# Patient Record
Sex: Female | Born: 1940 | Race: White | Hispanic: No | Marital: Married | Smoking: Never smoker
Health system: Southern US, Community
[De-identification: ages and names within clinical notes are randomized; demographics above are authoritative.]

## PROBLEM LIST (undated history)

## (undated) DIAGNOSIS — Z931 Gastrostomy status: Secondary | ICD-10-CM

## (undated) DIAGNOSIS — J9610 Chronic respiratory failure, unspecified whether with hypoxia or hypercapnia: Secondary | ICD-10-CM

## (undated) DIAGNOSIS — K439 Ventral hernia without obstruction or gangrene: Secondary | ICD-10-CM

## (undated) DIAGNOSIS — M1990 Unspecified osteoarthritis, unspecified site: Secondary | ICD-10-CM

## (undated) DIAGNOSIS — C801 Malignant (primary) neoplasm, unspecified: Secondary | ICD-10-CM

## (undated) DIAGNOSIS — J3800 Paralysis of vocal cords and larynx, unspecified: Secondary | ICD-10-CM

## (undated) DIAGNOSIS — Z860100 Personal history of colon polyps, unspecified: Secondary | ICD-10-CM

## (undated) DIAGNOSIS — E119 Type 2 diabetes mellitus without complications: Secondary | ICD-10-CM

## (undated) DIAGNOSIS — H409 Unspecified glaucoma: Secondary | ICD-10-CM

## (undated) DIAGNOSIS — K219 Gastro-esophageal reflux disease without esophagitis: Secondary | ICD-10-CM

## (undated) DIAGNOSIS — G4730 Sleep apnea, unspecified: Secondary | ICD-10-CM

## (undated) DIAGNOSIS — G629 Polyneuropathy, unspecified: Secondary | ICD-10-CM

## (undated) DIAGNOSIS — C50919 Malignant neoplasm of unspecified site of unspecified female breast: Secondary | ICD-10-CM

## (undated) DIAGNOSIS — E7800 Pure hypercholesterolemia, unspecified: Secondary | ICD-10-CM

## (undated) DIAGNOSIS — Z923 Personal history of irradiation: Secondary | ICD-10-CM

## (undated) DIAGNOSIS — Z853 Personal history of malignant neoplasm of breast: Secondary | ICD-10-CM

## (undated) DIAGNOSIS — Z9221 Personal history of antineoplastic chemotherapy: Secondary | ICD-10-CM

## (undated) DIAGNOSIS — E1142 Type 2 diabetes mellitus with diabetic polyneuropathy: Secondary | ICD-10-CM

## (undated) HISTORY — DX: Malignant (primary) neoplasm, unspecified: C80.1

## (undated) HISTORY — PX: TRACHEOSTOMY: SUR1362

## (undated) HISTORY — DX: Personal history of malignant neoplasm of breast: Z85.3

## (undated) HISTORY — DX: Unspecified glaucoma: H40.9

## (undated) HISTORY — PX: COLONOSCOPY: SHX174

## (undated) HISTORY — DX: Sleep apnea, unspecified: G47.30

## (undated) HISTORY — DX: Pure hypercholesterolemia, unspecified: E78.00

## (undated) HISTORY — PX: CATARACT EXTRACTION, BILATERAL: SHX1313

## (undated) HISTORY — PX: ABDOMINAL HYSTERECTOMY: SHX81

## (undated) HISTORY — DX: Personal history of colon polyps, unspecified: Z86.0100

## (undated) HISTORY — DX: Polyneuropathy, unspecified: G62.9

## (undated) HISTORY — DX: Type 2 diabetes mellitus with diabetic polyneuropathy: E11.42

## (undated) HISTORY — DX: Personal history of colonic polyps: Z86.010

## (undated) HISTORY — PX: HYDROCELE EXCISION / REPAIR: SUR1145

---

## 2000-02-27 DIAGNOSIS — C801 Malignant (primary) neoplasm, unspecified: Secondary | ICD-10-CM

## 2000-02-27 DIAGNOSIS — C50911 Malignant neoplasm of unspecified site of right female breast: Secondary | ICD-10-CM

## 2000-02-27 DIAGNOSIS — C50919 Malignant neoplasm of unspecified site of unspecified female breast: Secondary | ICD-10-CM

## 2000-02-27 DIAGNOSIS — Z9221 Personal history of antineoplastic chemotherapy: Secondary | ICD-10-CM

## 2000-02-27 DIAGNOSIS — Z923 Personal history of irradiation: Secondary | ICD-10-CM

## 2000-02-27 HISTORY — DX: Malignant (primary) neoplasm, unspecified: C80.1

## 2000-02-27 HISTORY — DX: Personal history of irradiation: Z92.3

## 2000-02-27 HISTORY — DX: Malignant neoplasm of unspecified site of unspecified female breast: C50.919

## 2000-02-27 HISTORY — PX: BREAST SURGERY: SHX581

## 2000-02-27 HISTORY — DX: Personal history of antineoplastic chemotherapy: Z92.21

## 2000-02-27 HISTORY — DX: Malignant neoplasm of unspecified site of right female breast: C50.911

## 2000-02-27 HISTORY — PX: BREAST EXCISIONAL BIOPSY: SUR124

## 2000-02-27 HISTORY — PX: BREAST LUMPECTOMY: SHX2

## 2006-02-26 HISTORY — PX: BREAST BIOPSY: SHX20

## 2006-05-31 ENCOUNTER — Ambulatory Visit: Payer: Self-pay | Admitting: General Surgery

## 2006-07-05 ENCOUNTER — Other Ambulatory Visit: Payer: Self-pay

## 2006-07-05 ENCOUNTER — Ambulatory Visit: Payer: Self-pay | Admitting: General Surgery

## 2006-07-11 ENCOUNTER — Ambulatory Visit: Payer: Self-pay | Admitting: General Surgery

## 2007-05-05 ENCOUNTER — Ambulatory Visit: Payer: Self-pay | Admitting: Internal Medicine

## 2007-05-30 ENCOUNTER — Ambulatory Visit: Payer: Self-pay | Admitting: General Surgery

## 2008-05-31 ENCOUNTER — Ambulatory Visit: Payer: Self-pay | Admitting: General Surgery

## 2008-09-17 ENCOUNTER — Ambulatory Visit: Payer: Self-pay

## 2008-10-13 ENCOUNTER — Ambulatory Visit: Payer: Self-pay | Admitting: General Surgery

## 2008-11-16 ENCOUNTER — Ambulatory Visit: Payer: Self-pay | Admitting: Gastroenterology

## 2009-05-17 ENCOUNTER — Ambulatory Visit: Payer: Self-pay | Admitting: Otolaryngology

## 2009-05-28 IMAGING — CR RIGHT HIP - COMPLETE 2+ VIEW
1 series · 2 of 2 positions shown · non-contrast
Comparison: none

REASON FOR EXAM: Right hip pain
COMMENTS:

[Series 1: view not recorded · 0.17mm/px · 2 of 2 slices shown]
[im 1/2]
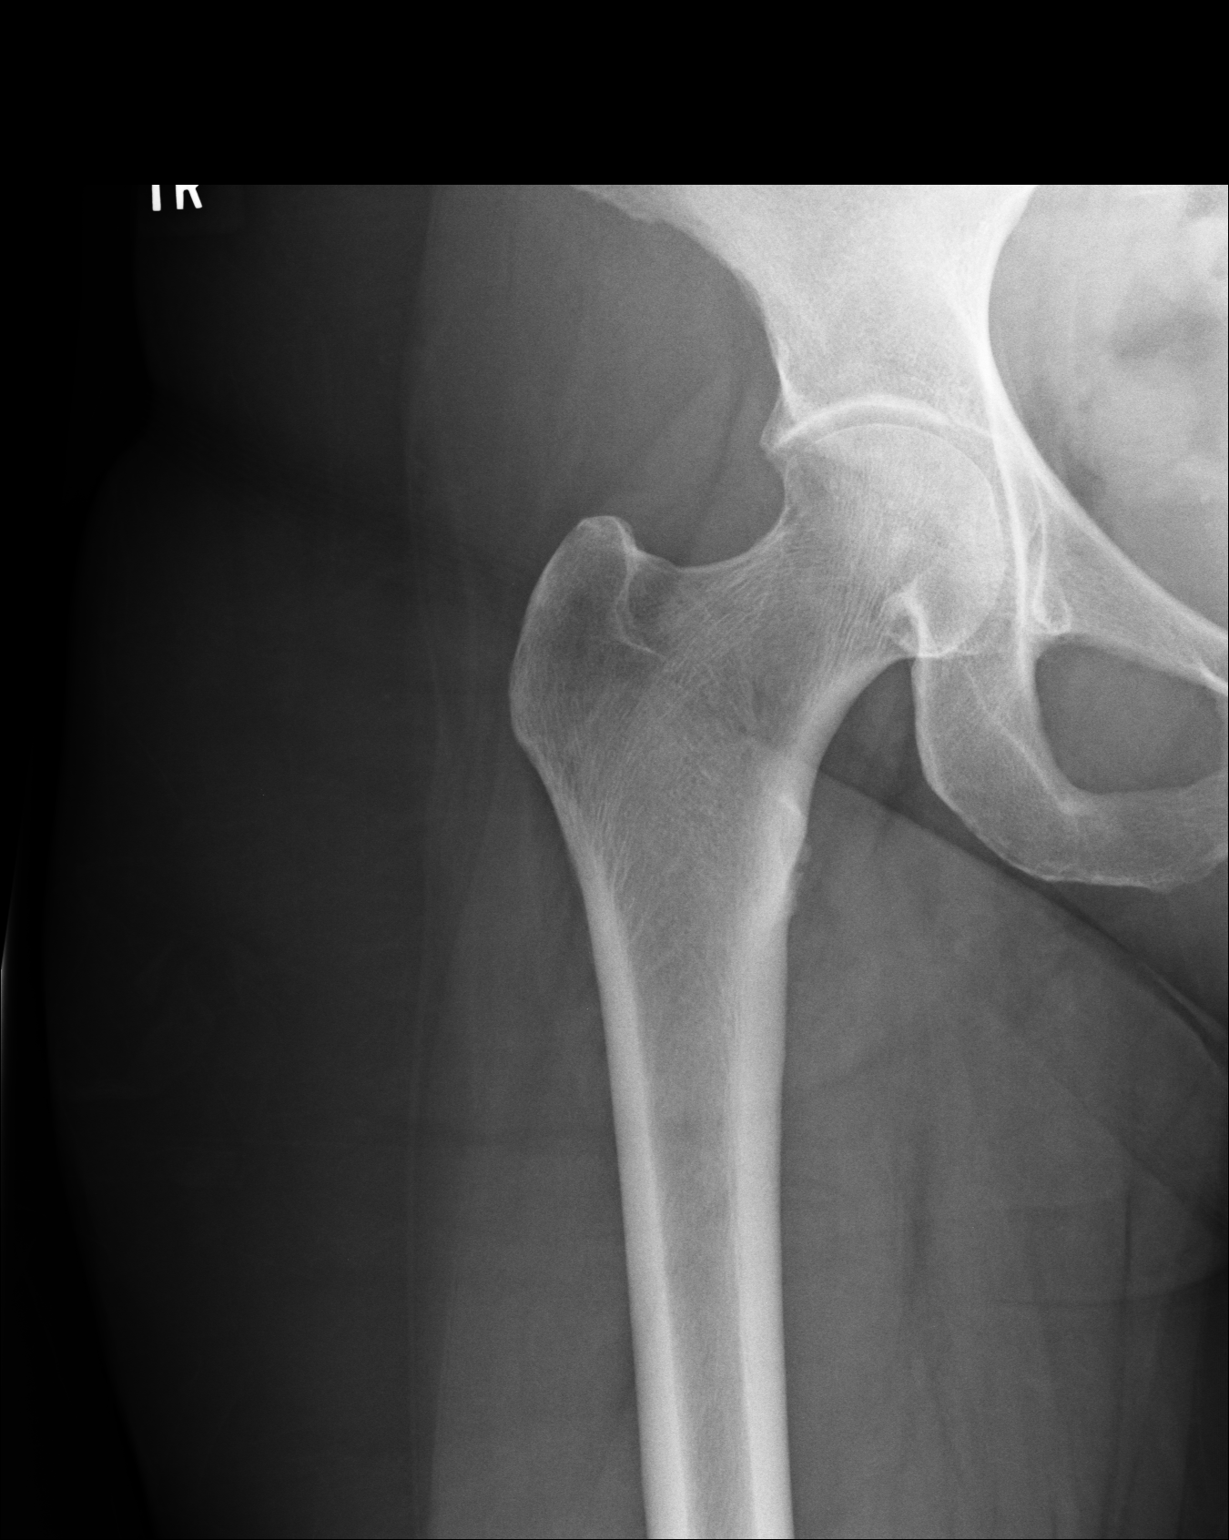
[im 2/2]
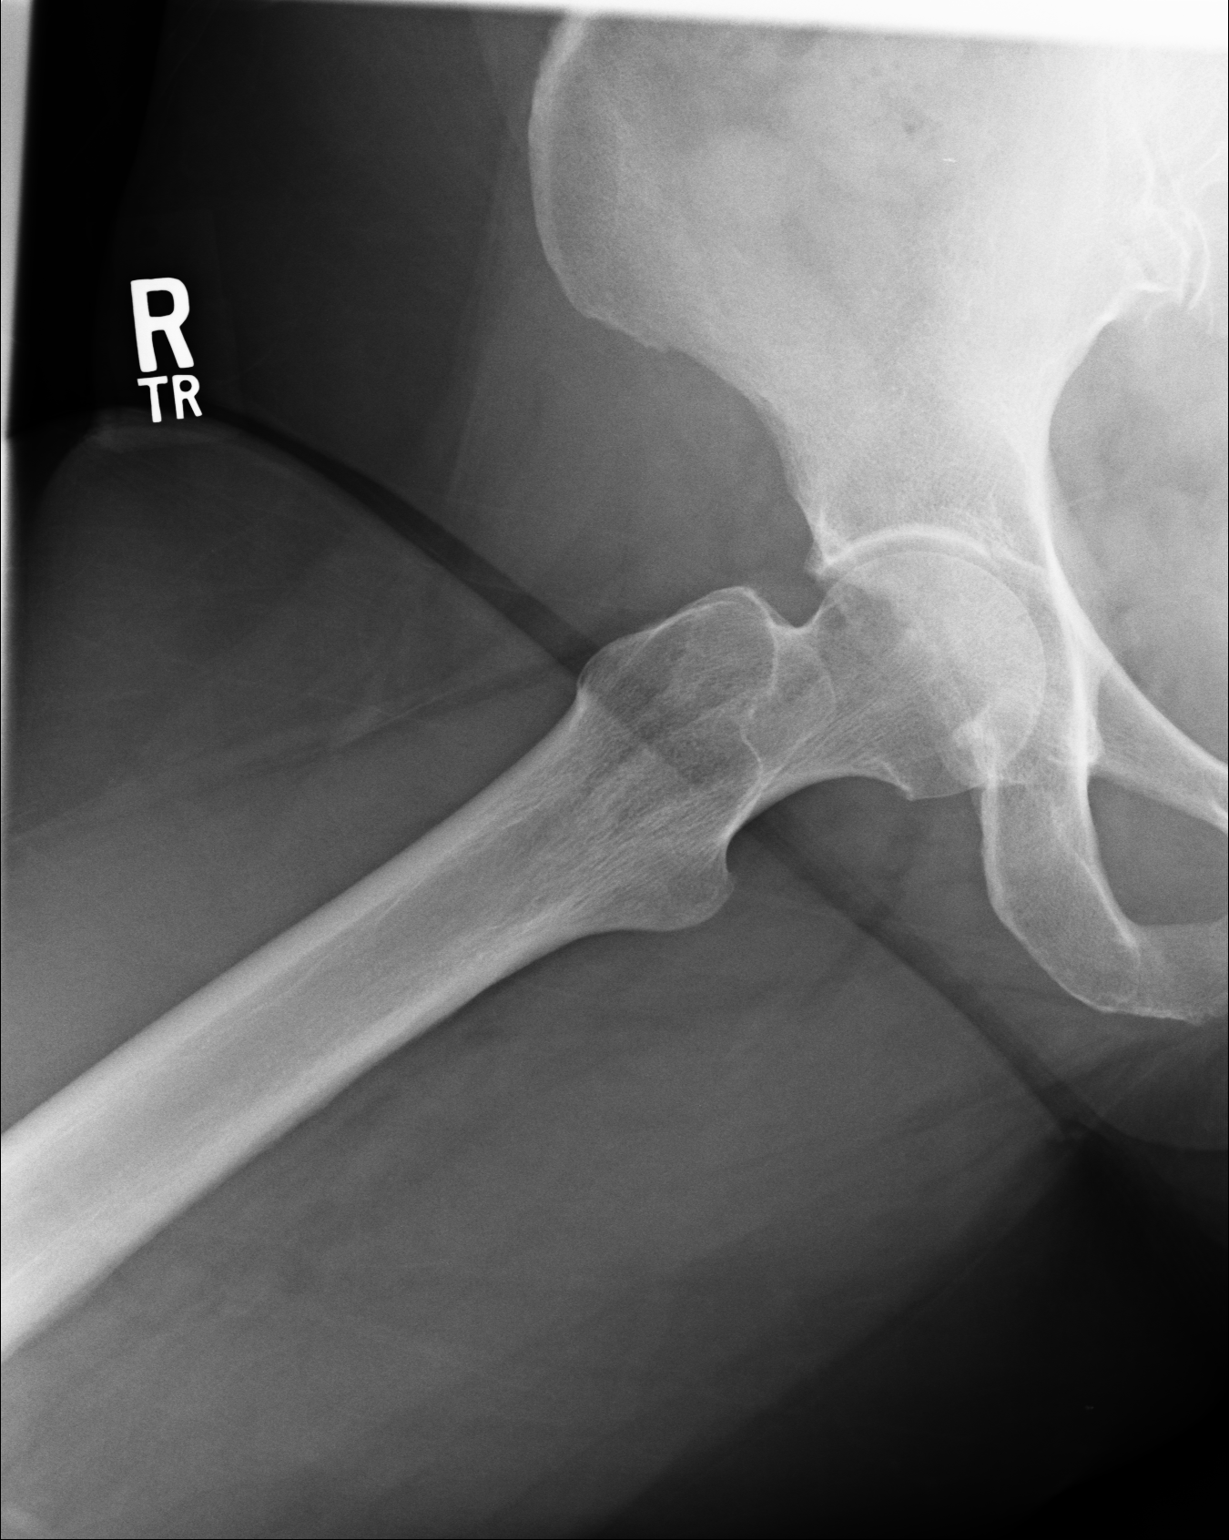

[2 of 2 positions shown; findings below may reference images not displayed]

PROCEDURE:     DXR - DXR HIP RIGHT COMPLETE  - May 05, 2007  [DATE]

RESULT:     AP and lateral views of the RIGHT hip reveal the bones to be
mildly osteopenic. I see no acute fracture. There is mild narrowing of the
hip joint space. The intertrochanteric region and the base of the femoral
neck appear normal. The observed portions of the RIGHT hemipelvis are normal.
IMPRESSION: There is mild degenerative change of the hip manifested by
joint space narrowing. I see no periosteal reaction or findings suspicious
for an occult fracture. Follow-up MRI is available if the patient's symptoms
persist and remain unexplained. CT scanning is also available there is a
history of trauma.

## 2009-06-15 ENCOUNTER — Ambulatory Visit: Payer: Self-pay | Admitting: General Surgery

## 2009-06-16 ENCOUNTER — Ambulatory Visit: Payer: Self-pay | Admitting: Otolaryngology

## 2010-02-26 DIAGNOSIS — J3800 Paralysis of vocal cords and larynx, unspecified: Secondary | ICD-10-CM

## 2010-02-26 DIAGNOSIS — Z930 Tracheostomy status: Secondary | ICD-10-CM

## 2010-02-26 HISTORY — DX: Paralysis of vocal cords and larynx, unspecified: J38.00

## 2010-02-26 HISTORY — DX: Tracheostomy status: Z93.0

## 2010-03-09 ENCOUNTER — Ambulatory Visit: Payer: Self-pay | Admitting: Otolaryngology

## 2010-03-21 ENCOUNTER — Ambulatory Visit: Payer: Self-pay | Admitting: Otolaryngology

## 2010-05-27 ENCOUNTER — Inpatient Hospital Stay: Payer: Self-pay | Admitting: Specialist

## 2010-06-13 ENCOUNTER — Emergency Department: Payer: Self-pay | Admitting: Emergency Medicine

## 2010-06-16 ENCOUNTER — Ambulatory Visit: Payer: Self-pay | Admitting: Orthopedic Surgery

## 2010-06-20 ENCOUNTER — Inpatient Hospital Stay: Payer: Self-pay | Admitting: Orthopedic Surgery

## 2010-06-23 ENCOUNTER — Encounter: Payer: Self-pay | Admitting: Internal Medicine

## 2010-06-27 ENCOUNTER — Encounter: Payer: Self-pay | Admitting: Internal Medicine

## 2010-07-18 IMAGING — MR MR OUTSIDE FILMS BODY
19 series · 19 of 19 positions shown · IV contrast (20CC  MULTIHANCE)
Comparison: none

[Series 1: 3 plane loc · axial · 7.0mm · 1.56mm/px · 1 of 29 slices shown]
[im 1/29]
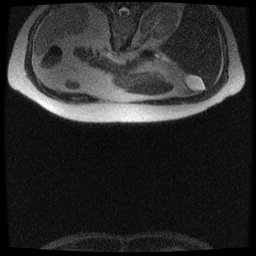

[Series 3: T1 · axial · 3.0mm · 0.68mm/px · 1 of 47 slices shown]
[im 1/47]
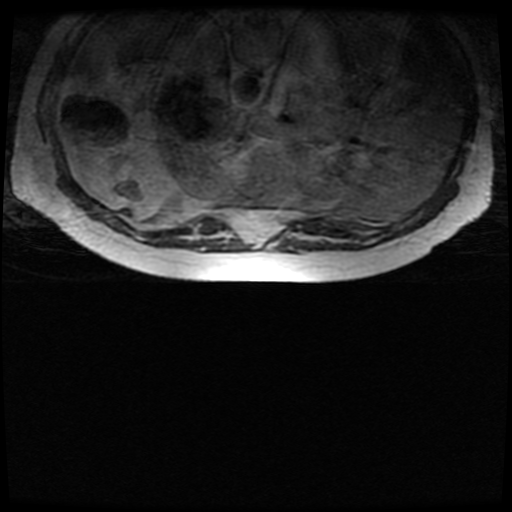

[Series 4: STIR · axial · 3.0mm · 1.37mm/px · 1 of 60 slices shown]
[im 1/60]
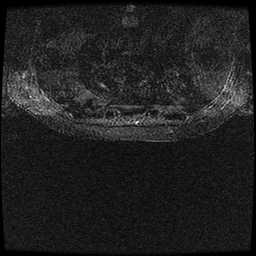

[Series 5: ax vibrant-mask · axial · 3.0mm · 0.68mm/px · 1 of 60 slices shown]
[im 1/60]
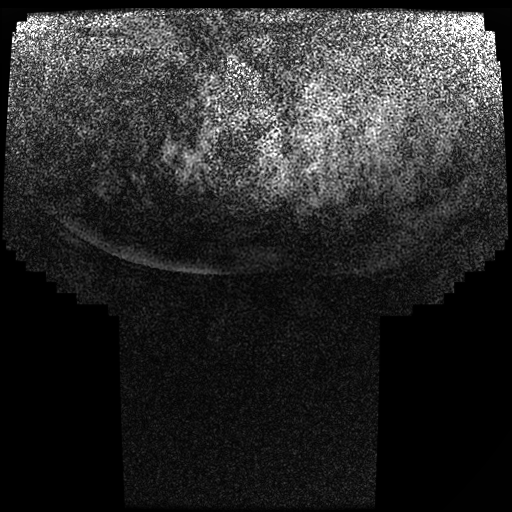

[Series 6: ax vibrant-dyn +c · axial · 3.0mm · 0.68mm/px · 1 of 420 slices shown]
[im 1/420]
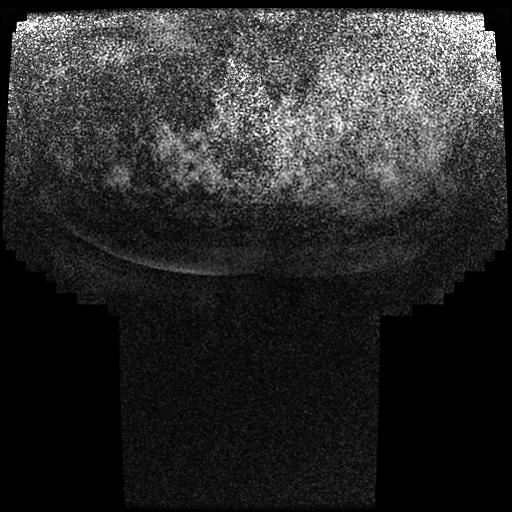

[Series 100: ((id)/(date))-((id)/(date)) · axial · 3.0mm · 0.68mm/px · 1 of 53 slices shown (1 of 2)]
[im 1/53]
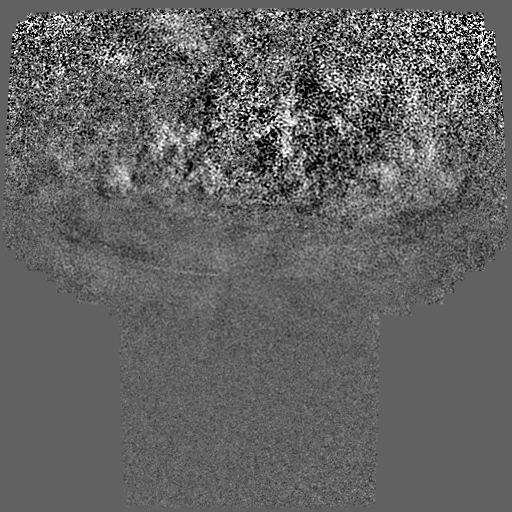

[Series 101: ((id)/6/61)-((id)/(date)) · axial · 3.0mm · 0.68mm/px · 1 of 58 slices shown]
[im 1/58]
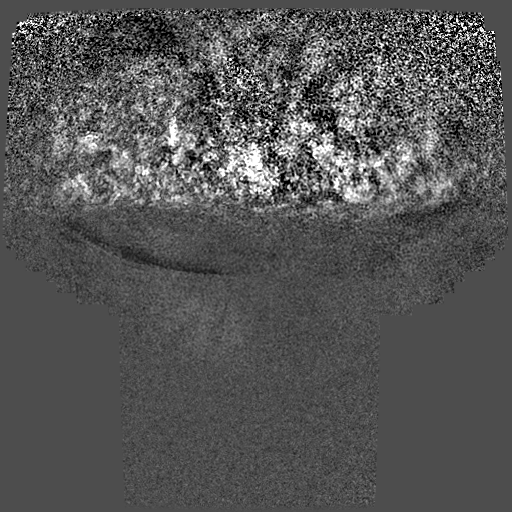

[Series 102: ((id)/6/121)-((id)/(date)) · axial · 3.0mm · 0.68mm/px · 1 of 59 slices shown]
[im 1/59]
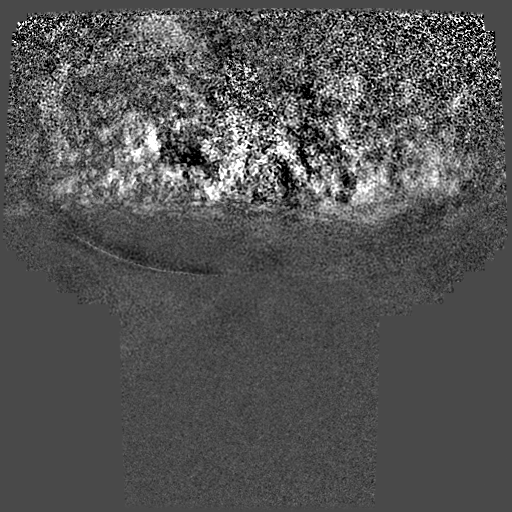

[Series 103: ((id)/6/181)-((id)/(date)) · axial · 3.0mm · 0.68mm/px · 1 of 57 slices shown]
[im 1/57]
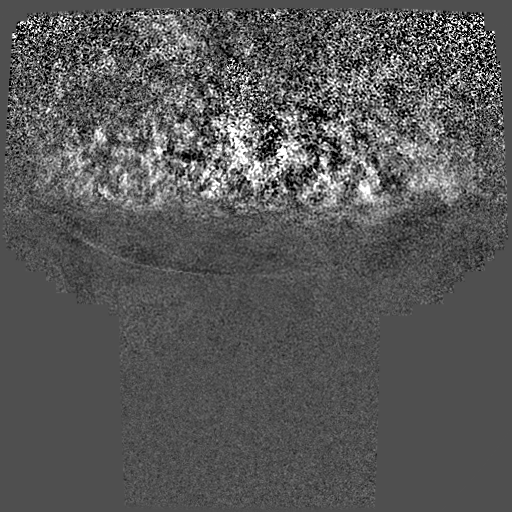

[Series 104: ((id)/6/241)-((id)/(date)) · axial · 3.0mm · 0.68mm/px · 1 of 59 slices shown]
[im 1/59]
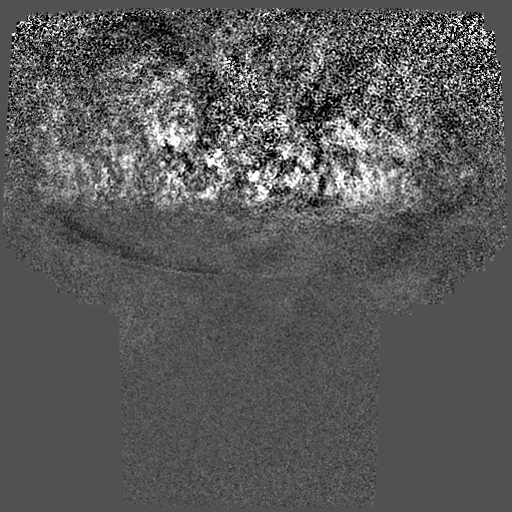

[Series 105: ((id)/6/301)-((id)/(date)) · axial · 3.0mm · 0.68mm/px · 1 of 59 slices shown]
[im 1/59]
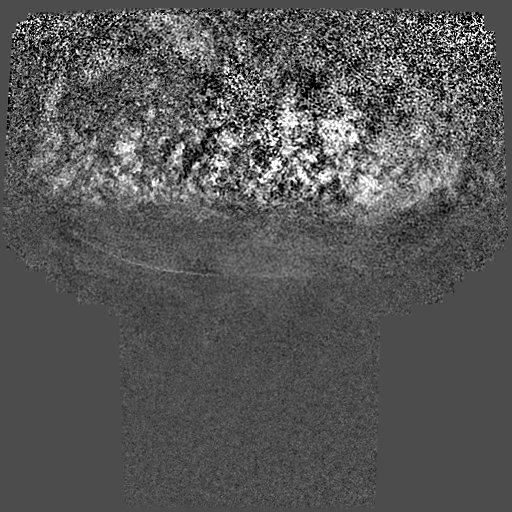

[Series 106: ((id)/6/361)-((id)/(date)) · axial · 3.0mm · 0.68mm/px · 1 of 59 slices shown]
[im 1/59]
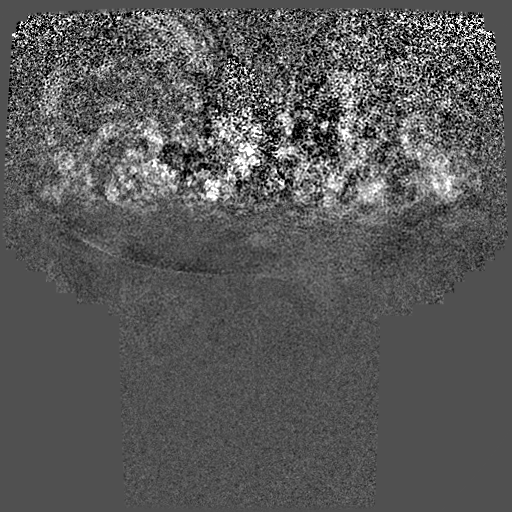

[Series 107: pjn · axial · 3.0mm · 0.68mm/px · 1 of 20 slices shown (1 of 3)]
[im 1/20]
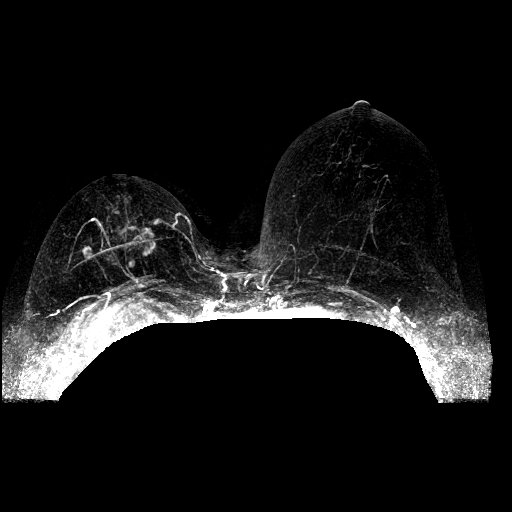

[Series 108: pjn · axial · 3.0mm · 0.68mm/px · 1 of 18 slices shown (2 of 3)]
[im 1/18]
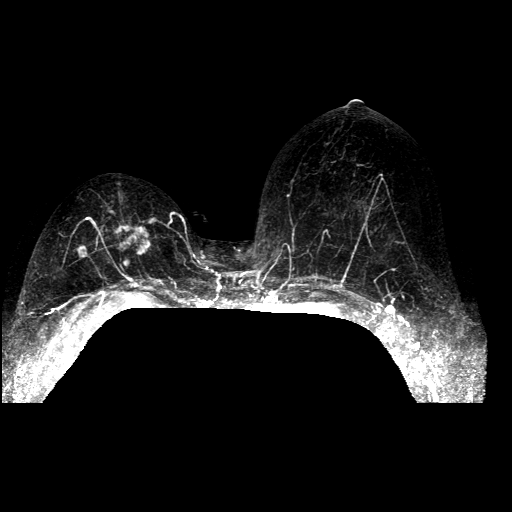

[Series 109: pjn · axial · 3.0mm · 0.68mm/px · 1 of 20 slices shown (3 of 3)]
[im 1/20]
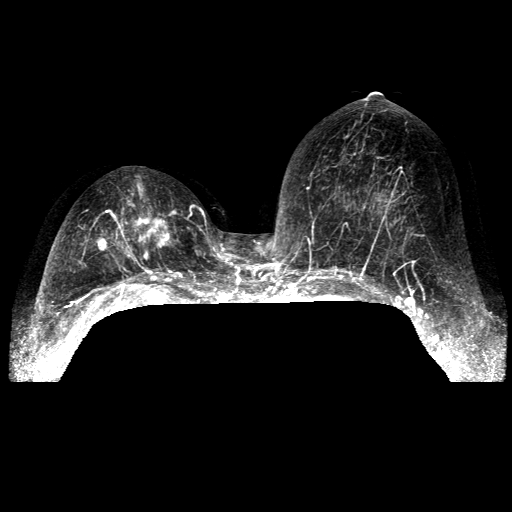

[Series 110: reformatted · axial · 3.0mm · 0.68mm/px · 1 of 180 slices shown (1 of 3)]
[im 1/180]
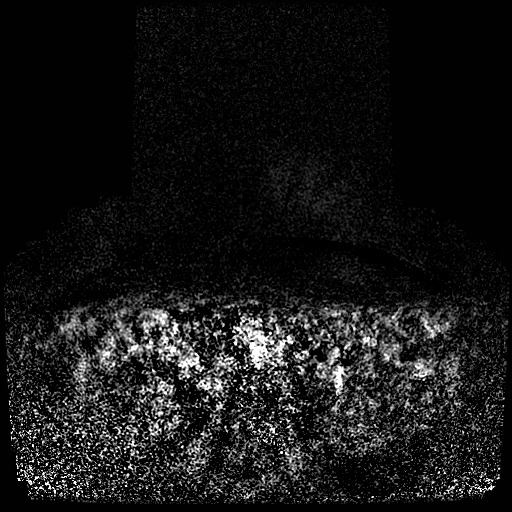

[Series 111: reformatted · axial · 3.0mm · 0.68mm/px · 1 of 180 slices shown (2 of 3)]
[im 1/180]
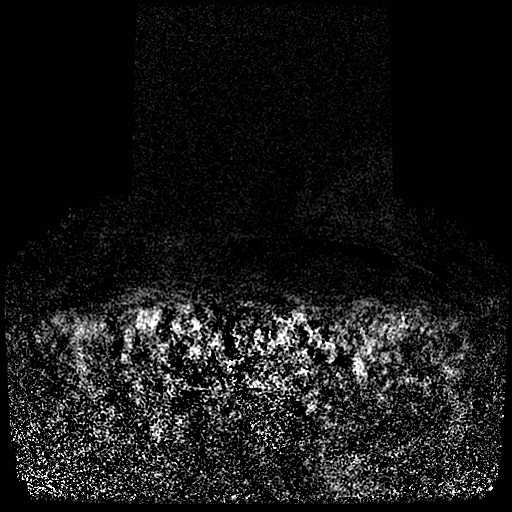

[Series 112: reformatted · axial · 3.0mm · 0.68mm/px · 1 of 180 slices shown (3 of 3)]
[im 1/180]
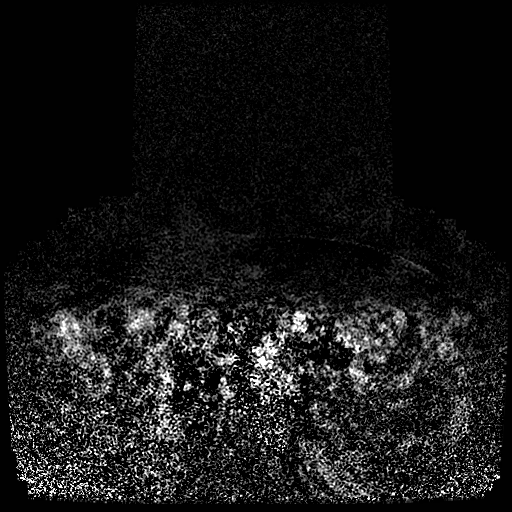

[Series 600: ((id)/(date))-((id)/(date)) · axial · 3.0mm · 0.68mm/px · 1 of 405 slices shown (2 of 2)]
[im 1/405]
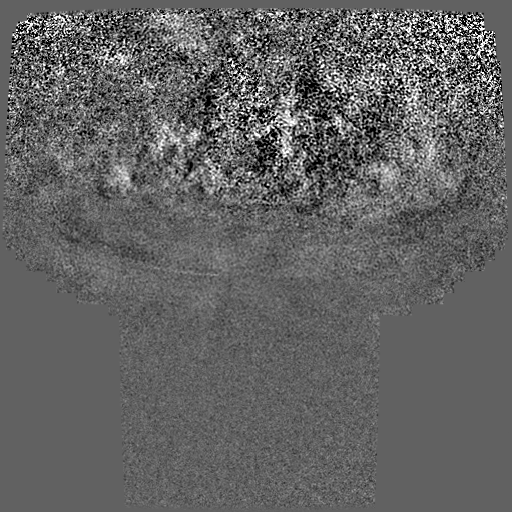

[19 of 19 positions shown; findings below may reference images not displayed]

IMAGES IMPORTED FROM THE [employer] WORK[employer]OW SYSTEM
NO DICTATION FOR STUDY

## 2010-09-20 ENCOUNTER — Ambulatory Visit: Payer: Self-pay | Admitting: General Surgery

## 2010-10-15 ENCOUNTER — Emergency Department: Payer: Self-pay | Admitting: *Deleted

## 2010-10-16 ENCOUNTER — Emergency Department: Payer: Self-pay | Admitting: Emergency Medicine

## 2010-10-18 ENCOUNTER — Inpatient Hospital Stay: Payer: Self-pay | Admitting: *Deleted

## 2010-11-01 ENCOUNTER — Ambulatory Visit (HOSPITAL_COMMUNITY)
Admission: AD | Admit: 2010-11-01 | Discharge: 2010-11-01 | Disposition: A | Discharge status: Home / Self Care | Payer: Medicare Other | Source: Other Acute Inpatient Hospital | Attending: Internal Medicine | Admitting: Internal Medicine

## 2010-11-01 ENCOUNTER — Other Ambulatory Visit (HOSPITAL_COMMUNITY): Payer: Self-pay

## 2010-11-01 ENCOUNTER — Institutional Professional Consult (permissible substitution)
Admission: AD | Admit: 2010-11-01 | Discharge: 2010-12-01 | Disposition: A | Discharge status: Home Health | Payer: Self-pay | Source: Ambulatory Visit | Attending: Internal Medicine | Admitting: Internal Medicine

## 2010-11-01 DIAGNOSIS — J9600 Acute respiratory failure, unspecified whether with hypoxia or hypercapnia: Secondary | ICD-10-CM | POA: Insufficient documentation

## 2010-11-02 ENCOUNTER — Other Ambulatory Visit (HOSPITAL_COMMUNITY): Payer: Self-pay

## 2010-11-02 DIAGNOSIS — J3800 Paralysis of vocal cords and larynx, unspecified: Secondary | ICD-10-CM

## 2010-11-02 DIAGNOSIS — J189 Pneumonia, unspecified organism: Secondary | ICD-10-CM

## 2010-11-02 DIAGNOSIS — J9620 Acute and chronic respiratory failure, unspecified whether with hypoxia or hypercapnia: Secondary | ICD-10-CM

## 2010-11-02 LAB — GRAM STAIN

## 2010-11-02 LAB — CBC
Platelets: 467 10*3/uL — ABNORMAL HIGH (ref 150–400)
RBC: 3.87 MIL/uL (ref 3.87–5.11)
WBC: 15.9 10*3/uL — ABNORMAL HIGH (ref 4.0–10.5)

## 2010-11-02 LAB — IRON AND TIBC
Iron: <10 ug/dL — ABNORMAL LOW (ref 42–135)
UIBC: 309 ug/dL (ref 125–400)

## 2010-11-02 LAB — BASIC METABOLIC PANEL
CO2: 30 mEq/L (ref 19–32)
Chloride: 101 mEq/L (ref 96–112)
Potassium: 4.6 mEq/L (ref 3.5–5.1)
Sodium: 139 mEq/L (ref 135–145)

## 2010-11-02 LAB — URINALYSIS, ROUTINE W REFLEX MICROSCOPIC
Ketones, ur: NEGATIVE mg/dL
Leukocytes, UA: NEGATIVE
Protein, ur: NEGATIVE mg/dL
Urobilinogen, UA: 0.2 mg/dL (ref 0.0–1.0)

## 2010-11-02 LAB — URINE MICROSCOPIC-ADD ON

## 2010-11-02 LAB — PROCALCITONIN: Procalcitonin: <0.1 ng/mL

## 2010-11-02 LAB — MAGNESIUM: Magnesium: 2.3 mg/dL (ref 1.5–2.5)

## 2010-11-03 LAB — URINE CULTURE: Culture  Setup Time: 201209061411

## 2010-11-04 ENCOUNTER — Other Ambulatory Visit (HOSPITAL_COMMUNITY): Payer: Self-pay

## 2010-11-04 LAB — CBC
Hemoglobin: 8 g/dL — ABNORMAL LOW (ref 12.0–15.0)
MCH: 21.9 pg — ABNORMAL LOW (ref 26.0–34.0)
MCHC: 29 g/dL — ABNORMAL LOW (ref 30.0–36.0)
Platelets: 464 10*3/uL — ABNORMAL HIGH (ref 150–400)
RDW: 16.3 % — ABNORMAL HIGH (ref 11.5–15.5)

## 2010-11-04 LAB — BASIC METABOLIC PANEL
Calcium: 10.2 mg/dL (ref 8.4–10.5)
GFR calc Af Amer: >60 mL/min (ref >60)
GFR calc non Af Amer: >60 mL/min (ref >60)
Potassium: 4.3 mEq/L (ref 3.5–5.1)
Sodium: 144 mEq/L (ref 135–145)

## 2010-11-05 LAB — CLOSTRIDIUM DIFFICILE BY PCR: Toxigenic C. Difficile by PCR: NEGATIVE

## 2010-11-06 ENCOUNTER — Other Ambulatory Visit (HOSPITAL_COMMUNITY): Payer: Self-pay

## 2010-11-06 DIAGNOSIS — J9620 Acute and chronic respiratory failure, unspecified whether with hypoxia or hypercapnia: Secondary | ICD-10-CM

## 2010-11-06 DIAGNOSIS — Z930 Tracheostomy status: Secondary | ICD-10-CM

## 2010-11-06 DIAGNOSIS — J3800 Paralysis of vocal cords and larynx, unspecified: Secondary | ICD-10-CM

## 2010-11-06 DIAGNOSIS — J189 Pneumonia, unspecified organism: Secondary | ICD-10-CM

## 2010-11-06 LAB — CULTURE, RESPIRATORY W GRAM STAIN

## 2010-11-06 LAB — BASIC METABOLIC PANEL
CO2: 33 mEq/L — ABNORMAL HIGH (ref 19–32)
Calcium: 9.9 mg/dL (ref 8.4–10.5)
Chloride: 102 mEq/L (ref 96–112)
Creatinine, Ser: <0.47 mg/dL — ABNORMAL LOW (ref 0.50–1.10)
Sodium: 142 mEq/L (ref 135–145)

## 2010-11-06 LAB — CBC
MCV: 75.2 fL — ABNORMAL LOW (ref 78.0–100.0)
Platelets: 414 10*3/uL — ABNORMAL HIGH (ref 150–400)
RBC: 3.3 MIL/uL — ABNORMAL LOW (ref 3.87–5.11)
RDW: 15.9 % — ABNORMAL HIGH (ref 11.5–15.5)
WBC: 7.8 10*3/uL (ref 4.0–10.5)

## 2010-11-06 LAB — ABO/RH: ABO/RH(D): O NEG

## 2010-11-07 ENCOUNTER — Other Ambulatory Visit (HOSPITAL_COMMUNITY): Payer: Self-pay

## 2010-11-07 LAB — CROSSMATCH
ABO/RH(D): O NEG
Unit division: 0

## 2010-11-07 LAB — URINALYSIS, ROUTINE W REFLEX MICROSCOPIC
Nitrite: NEGATIVE
Protein, ur: NEGATIVE mg/dL
Urobilinogen, UA: 2 mg/dL — ABNORMAL HIGH (ref 0.0–1.0)

## 2010-11-07 LAB — CBC
Hemoglobin: 9.6 g/dL — ABNORMAL LOW (ref 12.0–15.0)
MCHC: 30.7 g/dL (ref 30.0–36.0)
Platelets: 406 10*3/uL — ABNORMAL HIGH (ref 150–400)

## 2010-11-07 LAB — BASIC METABOLIC PANEL
GFR calc Af Amer: >60 mL/min (ref >60)
GFR calc non Af Amer: >60 mL/min (ref >60)
Glucose, Bld: 185 mg/dL — ABNORMAL HIGH (ref 70–99)
Potassium: 3.6 mEq/L (ref 3.5–5.1)
Sodium: 140 mEq/L (ref 135–145)

## 2010-11-07 LAB — MAGNESIUM: Magnesium: 2.3 mg/dL (ref 1.5–2.5)

## 2010-11-07 LAB — URINE MICROSCOPIC-ADD ON

## 2010-11-08 DIAGNOSIS — Z930 Tracheostomy status: Secondary | ICD-10-CM

## 2010-11-08 DIAGNOSIS — J9620 Acute and chronic respiratory failure, unspecified whether with hypoxia or hypercapnia: Secondary | ICD-10-CM

## 2010-11-08 DIAGNOSIS — J3800 Paralysis of vocal cords and larynx, unspecified: Secondary | ICD-10-CM

## 2010-11-08 DIAGNOSIS — J189 Pneumonia, unspecified organism: Secondary | ICD-10-CM

## 2010-11-08 LAB — BASIC METABOLIC PANEL
CO2: 32 mEq/L (ref 19–32)
Calcium: 10.2 mg/dL (ref 8.4–10.5)
Chloride: 105 mEq/L (ref 96–112)
Creatinine, Ser: 0.5 mg/dL (ref 0.50–1.10)
Glucose, Bld: 177 mg/dL — ABNORMAL HIGH (ref 70–99)

## 2010-11-08 LAB — URINE CULTURE
Culture  Setup Time: 201209112311
Culture: NO GROWTH

## 2010-11-08 LAB — CULTURE, BLOOD (SINGLE)
Culture  Setup Time: 201209061622
Culture: NO GROWTH

## 2010-11-08 LAB — CBC
Hemoglobin: 9.4 g/dL — ABNORMAL LOW (ref 12.0–15.0)
MCH: 23.3 pg — ABNORMAL LOW (ref 26.0–34.0)
MCV: 77.2 fL — ABNORMAL LOW (ref 78.0–100.0)
RBC: 4.03 MIL/uL (ref 3.87–5.11)
WBC: 11.6 10*3/uL — ABNORMAL HIGH (ref 4.0–10.5)

## 2010-11-09 LAB — CBC
HCT: 31.8 % — ABNORMAL LOW (ref 36.0–46.0)
MCHC: 30.2 g/dL (ref 30.0–36.0)
RDW: 17.2 % — ABNORMAL HIGH (ref 11.5–15.5)
WBC: 10.5 10*3/uL (ref 4.0–10.5)

## 2010-11-09 LAB — TSH: TSH: 2.855 u[IU]/mL (ref 0.350–4.500)

## 2010-11-09 LAB — T4: T4, Total: 8.4 ug/dL (ref 5.0–12.5)

## 2010-11-10 DIAGNOSIS — Z930 Tracheostomy status: Secondary | ICD-10-CM

## 2010-11-10 DIAGNOSIS — J3800 Paralysis of vocal cords and larynx, unspecified: Secondary | ICD-10-CM

## 2010-11-10 DIAGNOSIS — J9620 Acute and chronic respiratory failure, unspecified whether with hypoxia or hypercapnia: Secondary | ICD-10-CM

## 2010-11-10 DIAGNOSIS — J189 Pneumonia, unspecified organism: Secondary | ICD-10-CM

## 2010-11-10 LAB — CBC
HCT: 32.4 % — ABNORMAL LOW (ref 36.0–46.0)
MCHC: 29.9 g/dL — ABNORMAL LOW (ref 30.0–36.0)
Platelets: 354 10*3/uL (ref 150–400)
RDW: 17.6 % — ABNORMAL HIGH (ref 11.5–15.5)
WBC: 10.5 10*3/uL (ref 4.0–10.5)

## 2010-11-10 LAB — BASIC METABOLIC PANEL
CO2: 33 mEq/L — ABNORMAL HIGH (ref 19–32)
Calcium: 10.3 mg/dL (ref 8.4–10.5)
Creatinine, Ser: <0.47 mg/dL — ABNORMAL LOW (ref 0.50–1.10)
Glucose, Bld: 173 mg/dL — ABNORMAL HIGH (ref 70–99)
Potassium: 3.6 mEq/L (ref 3.5–5.1)
Sodium: 141 mEq/L (ref 135–145)

## 2010-11-11 LAB — BASIC METABOLIC PANEL
BUN: 22 mg/dL (ref 6–23)
Calcium: 10.4 mg/dL (ref 8.4–10.5)
GFR calc non Af Amer: >60 mL/min (ref >60)
Glucose, Bld: 152 mg/dL — ABNORMAL HIGH (ref 70–99)

## 2010-11-13 DIAGNOSIS — Z930 Tracheostomy status: Secondary | ICD-10-CM

## 2010-11-13 DIAGNOSIS — J9600 Acute respiratory failure, unspecified whether with hypoxia or hypercapnia: Secondary | ICD-10-CM

## 2010-11-13 DIAGNOSIS — J189 Pneumonia, unspecified organism: Secondary | ICD-10-CM

## 2010-11-13 DIAGNOSIS — J3800 Paralysis of vocal cords and larynx, unspecified: Secondary | ICD-10-CM

## 2010-11-13 LAB — BASIC METABOLIC PANEL
Calcium: 10.4 mg/dL (ref 8.4–10.5)
Creatinine, Ser: 0.49 mg/dL — ABNORMAL LOW (ref 0.50–1.10)
GFR calc non Af Amer: >60 mL/min (ref >60)
Glucose, Bld: 145 mg/dL — ABNORMAL HIGH (ref 70–99)
Sodium: 138 mEq/L (ref 135–145)

## 2010-11-13 LAB — CBC
Hemoglobin: 9.8 g/dL — ABNORMAL LOW (ref 12.0–15.0)
MCH: 23.1 pg — ABNORMAL LOW (ref 26.0–34.0)
MCHC: 30.2 g/dL (ref 30.0–36.0)
Platelets: 267 10*3/uL (ref 150–400)

## 2010-11-13 LAB — CULTURE, BLOOD (ROUTINE X 2)
Culture  Setup Time: 201209112212
Culture: NO GROWTH
Culture: NO GROWTH

## 2010-11-14 LAB — CBC
MCH: 24.6 pg — ABNORMAL LOW (ref 26.0–34.0)
Platelets: 232 10*3/uL (ref 150–400)
RBC: 4.07 MIL/uL (ref 3.87–5.11)
RDW: 17.9 % — ABNORMAL HIGH (ref 11.5–15.5)

## 2010-11-15 LAB — CBC
HCT: 32 % — ABNORMAL LOW (ref 36.0–46.0)
Platelets: 273 10*3/uL (ref 150–400)
RDW: 18 % — ABNORMAL HIGH (ref 11.5–15.5)
WBC: 13.6 10*3/uL — ABNORMAL HIGH (ref 4.0–10.5)

## 2010-11-15 LAB — BASIC METABOLIC PANEL
BUN: 18 mg/dL (ref 6–23)
Chloride: 100 mEq/L (ref 96–112)
Potassium: 4.6 mEq/L (ref 3.5–5.1)
Sodium: 137 mEq/L (ref 135–145)

## 2010-11-17 ENCOUNTER — Other Ambulatory Visit (HOSPITAL_COMMUNITY): Payer: Self-pay

## 2010-11-17 DIAGNOSIS — J383 Other diseases of vocal cords: Secondary | ICD-10-CM

## 2010-11-17 LAB — CBC
HCT: 32.6 % — ABNORMAL LOW (ref 36.0–46.0)
Hemoglobin: 10.1 g/dL — ABNORMAL LOW (ref 12.0–15.0)
RBC: 4.31 MIL/uL (ref 3.87–5.11)
WBC: 17.4 10*3/uL — ABNORMAL HIGH (ref 4.0–10.5)

## 2010-11-17 LAB — BASIC METABOLIC PANEL
GFR calc Af Amer: >60 mL/min (ref >60)
GFR calc non Af Amer: >60 mL/min (ref >60)
Potassium: 4.3 mEq/L (ref 3.5–5.1)
Sodium: 134 mEq/L — ABNORMAL LOW (ref 135–145)

## 2010-11-18 LAB — URINALYSIS, ROUTINE W REFLEX MICROSCOPIC
Nitrite: POSITIVE — AB
Specific Gravity, Urine: 1.016 (ref 1.005–1.030)
Urobilinogen, UA: 2 mg/dL — ABNORMAL HIGH (ref 0.0–1.0)

## 2010-11-18 LAB — URINE MICROSCOPIC-ADD ON

## 2010-11-19 ENCOUNTER — Other Ambulatory Visit (HOSPITAL_COMMUNITY): Payer: Self-pay

## 2010-11-19 LAB — BASIC METABOLIC PANEL WITH GFR
BUN: 13 mg/dL (ref 6–23)
CO2: 26 meq/L (ref 19–32)
Calcium: 10.5 mg/dL (ref 8.4–10.5)
Chloride: 102 meq/L (ref 96–112)
Creatinine, Ser: 0.49 mg/dL — ABNORMAL LOW (ref 0.50–1.10)
GFR calc Af Amer: >60 mL/min
GFR calc non Af Amer: >60 mL/min
Glucose, Bld: 152 mg/dL — ABNORMAL HIGH (ref 70–99)
Potassium: 4.3 meq/L (ref 3.5–5.1)
Sodium: 139 meq/L (ref 135–145)

## 2010-11-19 LAB — URINE CULTURE

## 2010-11-19 LAB — CBC
MCH: 23.2 pg — ABNORMAL LOW (ref 26.0–34.0)
MCHC: 30.3 g/dL (ref 30.0–36.0)
Platelets: 306 10*3/uL (ref 150–400)

## 2010-11-20 DIAGNOSIS — J9620 Acute and chronic respiratory failure, unspecified whether with hypoxia or hypercapnia: Secondary | ICD-10-CM

## 2010-11-20 DIAGNOSIS — Z930 Tracheostomy status: Secondary | ICD-10-CM

## 2010-11-20 DIAGNOSIS — J383 Other diseases of vocal cords: Secondary | ICD-10-CM

## 2010-11-20 LAB — BASIC METABOLIC PANEL
BUN: 15 mg/dL (ref 6–23)
CO2: 25 mEq/L (ref 19–32)
Chloride: 101 mEq/L (ref 96–112)
GFR calc non Af Amer: >60 mL/min (ref >60)
Glucose, Bld: 166 mg/dL — ABNORMAL HIGH (ref 70–99)
Potassium: 4.1 mEq/L (ref 3.5–5.1)

## 2010-11-20 LAB — CBC
HCT: 31 % — ABNORMAL LOW (ref 36.0–46.0)
Hemoglobin: 9.6 g/dL — ABNORMAL LOW (ref 12.0–15.0)
MCHC: 31 g/dL (ref 30.0–36.0)

## 2010-11-22 ENCOUNTER — Other Ambulatory Visit (HOSPITAL_COMMUNITY): Payer: Self-pay

## 2010-11-22 LAB — BASIC METABOLIC PANEL
BUN: 12 mg/dL (ref 6–23)
Calcium: 10.4 mg/dL (ref 8.4–10.5)
Creatinine, Ser: 0.54 mg/dL (ref 0.50–1.10)
GFR calc non Af Amer: >60 mL/min (ref >60)
Glucose, Bld: 145 mg/dL — ABNORMAL HIGH (ref 70–99)

## 2010-11-22 LAB — CBC
HCT: 32.9 % — ABNORMAL LOW (ref 36.0–46.0)
Hemoglobin: 10.1 g/dL — ABNORMAL LOW (ref 12.0–15.0)
MCH: 23.7 pg — ABNORMAL LOW (ref 26.0–34.0)
MCHC: 30.7 g/dL (ref 30.0–36.0)
MCV: 77 fL — ABNORMAL LOW (ref 78.0–100.0)

## 2010-11-24 LAB — CBC
MCH: 23.6 pg — ABNORMAL LOW (ref 26.0–34.0)
MCHC: 30.5 g/dL (ref 30.0–36.0)
MCV: 77.3 fL — ABNORMAL LOW (ref 78.0–100.0)
Platelets: 352 10*3/uL (ref 150–400)

## 2010-11-24 LAB — BASIC METABOLIC PANEL
Calcium: 10.2 mg/dL (ref 8.4–10.5)
Creatinine, Ser: 0.55 mg/dL (ref 0.50–1.10)
GFR calc non Af Amer: >60 mL/min (ref >60)
Sodium: 140 mEq/L (ref 135–145)

## 2010-11-24 LAB — MAGNESIUM: Magnesium: 1.8 mg/dL (ref 1.5–2.5)

## 2010-11-25 LAB — BASIC METABOLIC PANEL
Calcium: 10.4 mg/dL (ref 8.4–10.5)
Creatinine, Ser: 0.57 mg/dL (ref 0.50–1.10)
GFR calc non Af Amer: >60 mL/min (ref >60)
Glucose, Bld: 144 mg/dL — ABNORMAL HIGH (ref 70–99)
Sodium: 139 mEq/L (ref 135–145)

## 2010-11-27 DIAGNOSIS — J9620 Acute and chronic respiratory failure, unspecified whether with hypoxia or hypercapnia: Secondary | ICD-10-CM

## 2010-11-27 DIAGNOSIS — Z930 Tracheostomy status: Secondary | ICD-10-CM

## 2010-11-27 DIAGNOSIS — J383 Other diseases of vocal cords: Secondary | ICD-10-CM

## 2010-11-27 LAB — CBC
MCH: 23.7 pg — ABNORMAL LOW (ref 26.0–34.0)
MCV: 77 fL — ABNORMAL LOW (ref 78.0–100.0)
Platelets: 272 10*3/uL (ref 150–400)
RBC: 4.52 MIL/uL (ref 3.87–5.11)
RDW: 18.6 % — ABNORMAL HIGH (ref 11.5–15.5)
WBC: 8.5 10*3/uL (ref 4.0–10.5)

## 2010-11-27 LAB — BASIC METABOLIC PANEL
CO2: 28 mEq/L (ref 19–32)
Calcium: 10.1 mg/dL (ref 8.4–10.5)
Creatinine, Ser: 0.48 mg/dL — ABNORMAL LOW (ref 0.50–1.10)
GFR calc Af Amer: >90 mL/min (ref >90)
GFR calc non Af Amer: >90 mL/min (ref >90)
Sodium: 138 mEq/L (ref 135–145)

## 2010-11-29 ENCOUNTER — Other Ambulatory Visit (HOSPITAL_COMMUNITY): Payer: Self-pay

## 2010-11-29 LAB — BASIC METABOLIC PANEL
CO2: 27 mEq/L (ref 19–32)
Chloride: 98 mEq/L (ref 96–112)
GFR calc Af Amer: >90 mL/min (ref >90)
Potassium: 4.3 mEq/L (ref 3.5–5.1)
Sodium: 137 mEq/L (ref 135–145)

## 2010-11-29 LAB — CBC
MCV: 77.3 fL — ABNORMAL LOW (ref 78.0–100.0)
Platelets: 294 10*3/uL (ref 150–400)
RBC: 4.94 MIL/uL (ref 3.87–5.11)
RDW: 19.1 % — ABNORMAL HIGH (ref 11.5–15.5)
WBC: 9.9 10*3/uL (ref 4.0–10.5)

## 2010-11-30 DIAGNOSIS — F410 Panic disorder [episodic paroxysmal anxiety] without agoraphobia: Secondary | ICD-10-CM

## 2010-11-30 LAB — CALCIUM: Calcium: 9.9 mg/dL (ref 8.4–10.5)

## 2010-12-01 LAB — BASIC METABOLIC PANEL
BUN: 13 mg/dL (ref 6–23)
Chloride: 101 mEq/L (ref 96–112)
GFR calc non Af Amer: >90 mL/min (ref >90)
Glucose, Bld: 158 mg/dL — ABNORMAL HIGH (ref 70–99)
Potassium: 3.3 mEq/L — ABNORMAL LOW (ref 3.5–5.1)
Sodium: 139 mEq/L (ref 135–145)

## 2010-12-01 LAB — CBC
HCT: 35.4 % — ABNORMAL LOW (ref 36.0–46.0)
Hemoglobin: 10.8 g/dL — ABNORMAL LOW (ref 12.0–15.0)
MCHC: 30.5 g/dL (ref 30.0–36.0)
RBC: 4.52 MIL/uL (ref 3.87–5.11)
WBC: 7.5 10*3/uL (ref 4.0–10.5)

## 2010-12-10 ENCOUNTER — Inpatient Hospital Stay: Payer: Self-pay | Admitting: Specialist

## 2010-12-10 DIAGNOSIS — R0602 Shortness of breath: Secondary | ICD-10-CM

## 2010-12-15 ENCOUNTER — Inpatient Hospital Stay: Payer: Medicare Other | Admitting: Internal Medicine

## 2010-12-19 DIAGNOSIS — E119 Type 2 diabetes mellitus without complications: Secondary | ICD-10-CM | POA: Insufficient documentation

## 2010-12-19 DIAGNOSIS — E785 Hyperlipidemia, unspecified: Secondary | ICD-10-CM | POA: Insufficient documentation

## 2010-12-19 DIAGNOSIS — R042 Hemoptysis: Secondary | ICD-10-CM | POA: Insufficient documentation

## 2010-12-19 DIAGNOSIS — F411 Generalized anxiety disorder: Secondary | ICD-10-CM | POA: Insufficient documentation

## 2010-12-26 ENCOUNTER — Ambulatory Visit: Payer: Self-pay | Admitting: Hematology and Oncology

## 2010-12-28 ENCOUNTER — Ambulatory Visit: Payer: Self-pay | Admitting: Hematology and Oncology

## 2011-01-10 DIAGNOSIS — D142 Benign neoplasm of trachea: Secondary | ICD-10-CM | POA: Insufficient documentation

## 2011-01-16 DIAGNOSIS — J398 Other specified diseases of upper respiratory tract: Secondary | ICD-10-CM | POA: Insufficient documentation

## 2011-01-16 DIAGNOSIS — IMO0001 Reserved for inherently not codable concepts without codable children: Secondary | ICD-10-CM | POA: Insufficient documentation

## 2011-01-23 ENCOUNTER — Inpatient Hospital Stay: Payer: Self-pay | Admitting: Internal Medicine

## 2011-02-27 HISTORY — PX: TRACHEAL SURGERY: SHX1096

## 2011-04-30 ENCOUNTER — Ambulatory Visit: Payer: Self-pay | Admitting: Hematology and Oncology

## 2011-10-07 ENCOUNTER — Emergency Department: Payer: Self-pay | Admitting: Emergency Medicine

## 2011-11-05 ENCOUNTER — Ambulatory Visit: Payer: Self-pay | Admitting: General Surgery

## 2011-12-06 DIAGNOSIS — J385 Laryngeal spasm: Secondary | ICD-10-CM | POA: Insufficient documentation

## 2011-12-06 DIAGNOSIS — Z930 Tracheostomy status: Secondary | ICD-10-CM | POA: Insufficient documentation

## 2011-12-16 ENCOUNTER — Inpatient Hospital Stay: Payer: Self-pay | Admitting: Internal Medicine

## 2011-12-16 LAB — COMPREHENSIVE METABOLIC PANEL
Albumin: 4.1 g/dL (ref 3.4–5.0)
Alkaline Phosphatase: 92 U/L (ref 50–136)
BUN: 18 mg/dL (ref 7–18)
Bilirubin,Total: 0.5 mg/dL (ref 0.2–1.0)
Calcium, Total: 9.6 mg/dL (ref 8.5–10.1)
Co2: 25 mmol/L (ref 21–32)
EGFR (African American): >60
Glucose: 110 mg/dL — ABNORMAL HIGH (ref 65–99)
Sodium: 137 mmol/L (ref 136–145)
Total Protein: 8.4 g/dL — ABNORMAL HIGH (ref 6.4–8.2)

## 2011-12-16 LAB — CK TOTAL AND CKMB (NOT AT ARMC): CK, Total: 80 U/L (ref 21–215)

## 2011-12-16 LAB — CBC
HGB: 15.9 g/dL (ref 12.0–16.0)
MCH: 28 pg (ref 26.0–34.0)
MCV: 82 fL (ref 80–100)
Platelet: 192 10*3/uL (ref 150–440)
RBC: 5.68 10*6/uL — ABNORMAL HIGH (ref 3.80–5.20)
RDW: 13.1 % (ref 11.5–14.5)

## 2011-12-16 LAB — PHOSPHORUS: Phosphorus: 2.2 mg/dL — ABNORMAL LOW (ref 2.5–4.9)

## 2011-12-16 LAB — TROPONIN I: Troponin-I: <0.02 ng/mL

## 2011-12-17 DIAGNOSIS — R55 Syncope and collapse: Secondary | ICD-10-CM

## 2011-12-17 LAB — BASIC METABOLIC PANEL
Chloride: 106 mmol/L (ref 98–107)
Co2: 24 mmol/L (ref 21–32)
EGFR (Non-African Amer.): >60
Osmolality: 285 (ref 275–301)
Potassium: 4.1 mmol/L (ref 3.5–5.1)

## 2011-12-17 LAB — CBC WITH DIFFERENTIAL/PLATELET
Basophil #: 0 10*3/uL (ref 0.0–0.1)
Basophil %: 0 %
Eosinophil #: 0 10*3/uL (ref 0.0–0.7)
HGB: 13.7 g/dL (ref 12.0–16.0)
Lymphocyte %: 9.4 %
MCH: 28 pg (ref 26.0–34.0)
MCHC: 34.1 g/dL (ref 32.0–36.0)
Neutrophil %: 89.5 %
Platelet: 167 10*3/uL (ref 150–440)
WBC: 9.2 10*3/uL (ref 3.6–11.0)

## 2011-12-17 LAB — CK TOTAL AND CKMB (NOT AT ARMC)
CK, Total: 94 U/L (ref 21–215)
CK, Total: 109 U/L (ref 21–215)
CK-MB: 0.9 ng/mL (ref 0.5–3.6)

## 2011-12-17 LAB — TROPONIN I: Troponin-I: <0.02 ng/mL

## 2012-06-19 IMAGING — CR DG CHEST 2V
1 series · 2 of 2 positions shown · non-contrast
Comparison: none

REASON FOR EXAM: cp
COMMENTS:   May transport without cardiac monitor

PROCEDURE:     DXR - DXR CHEST PA (OR AP) AND LATERAL  - May 27, 2010  [DATE]
RESULT:     No acute cardiopulmonary disease. Vascular calcification is
noted.

[Series 1: view not recorded · 0.17mm/px · 2 of 2 slices shown]
[im 1/2]
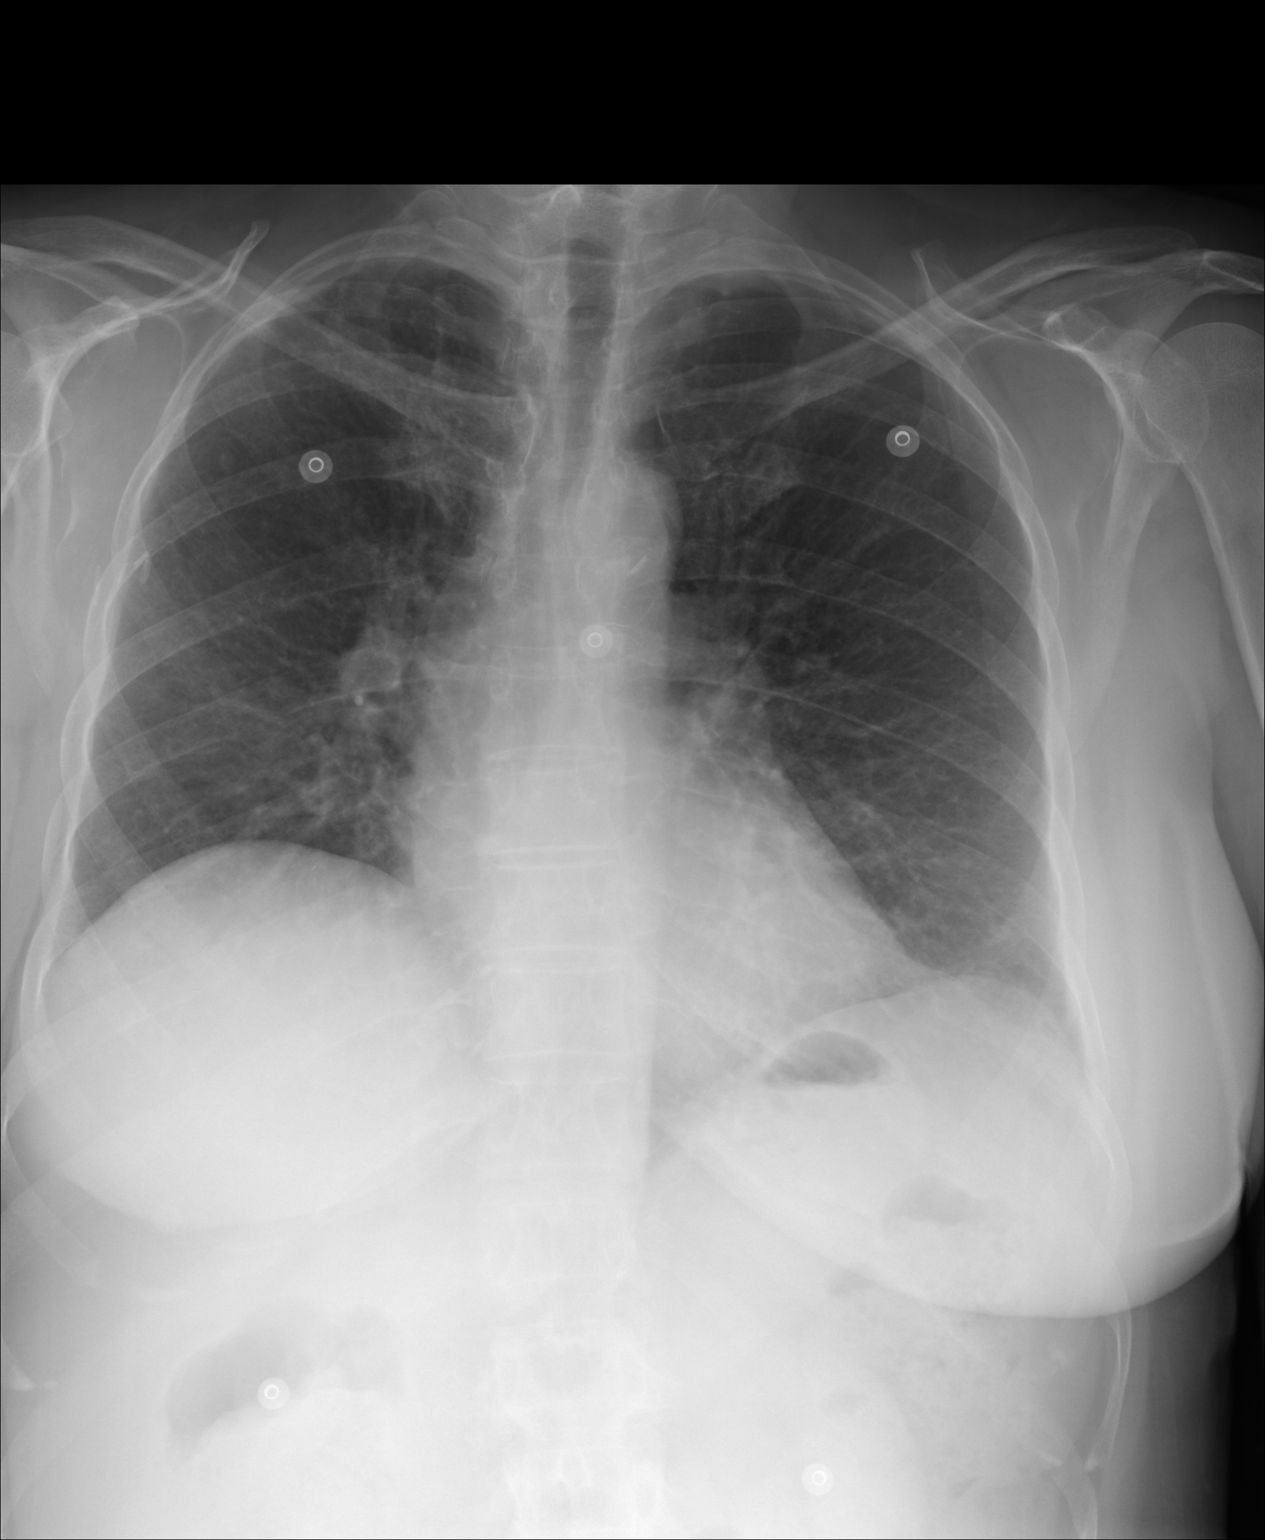
[im 2/2]
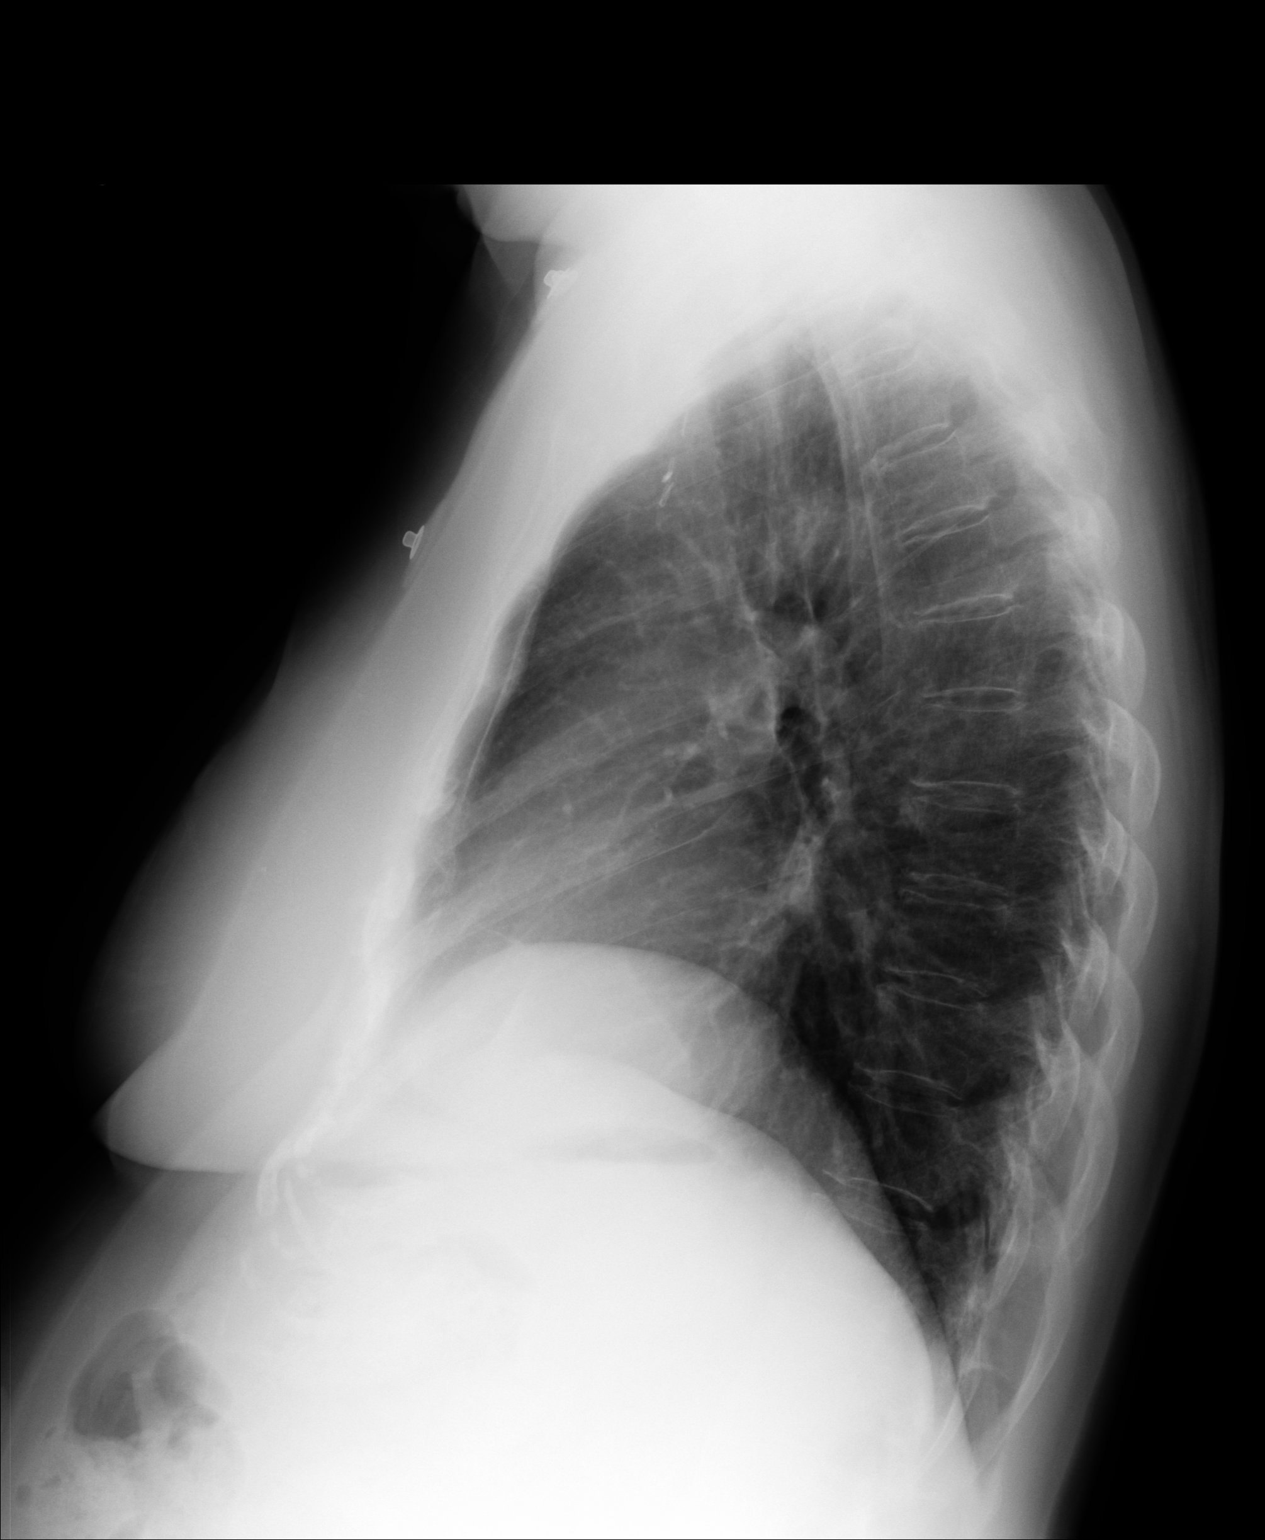

[2 of 2 positions shown; findings below may reference images not displayed]

IMPRESSION: No acute abnormality.

## 2012-07-06 IMAGING — CR DG KNEE COMPLETE 4+V*L*
1 series · 4 of 4 positions shown · non-contrast
Comparison: none

REASON FOR EXAM: injury
COMMENTS:

[Series 1: view not recorded · 0.17mm/px · 4 of 4 slices shown]
[im 1/4]
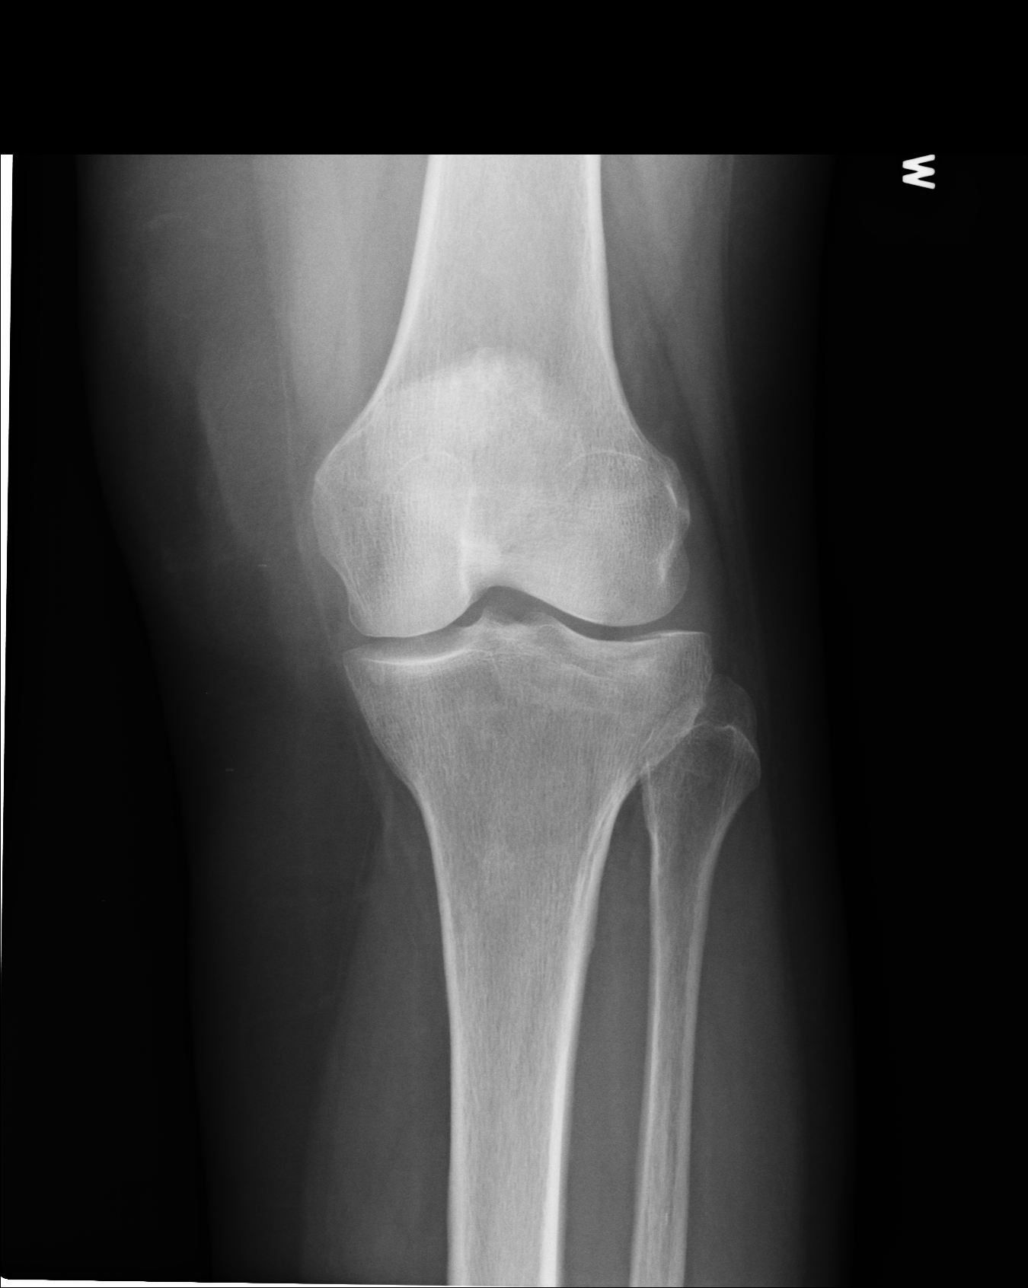
[im 2/4]
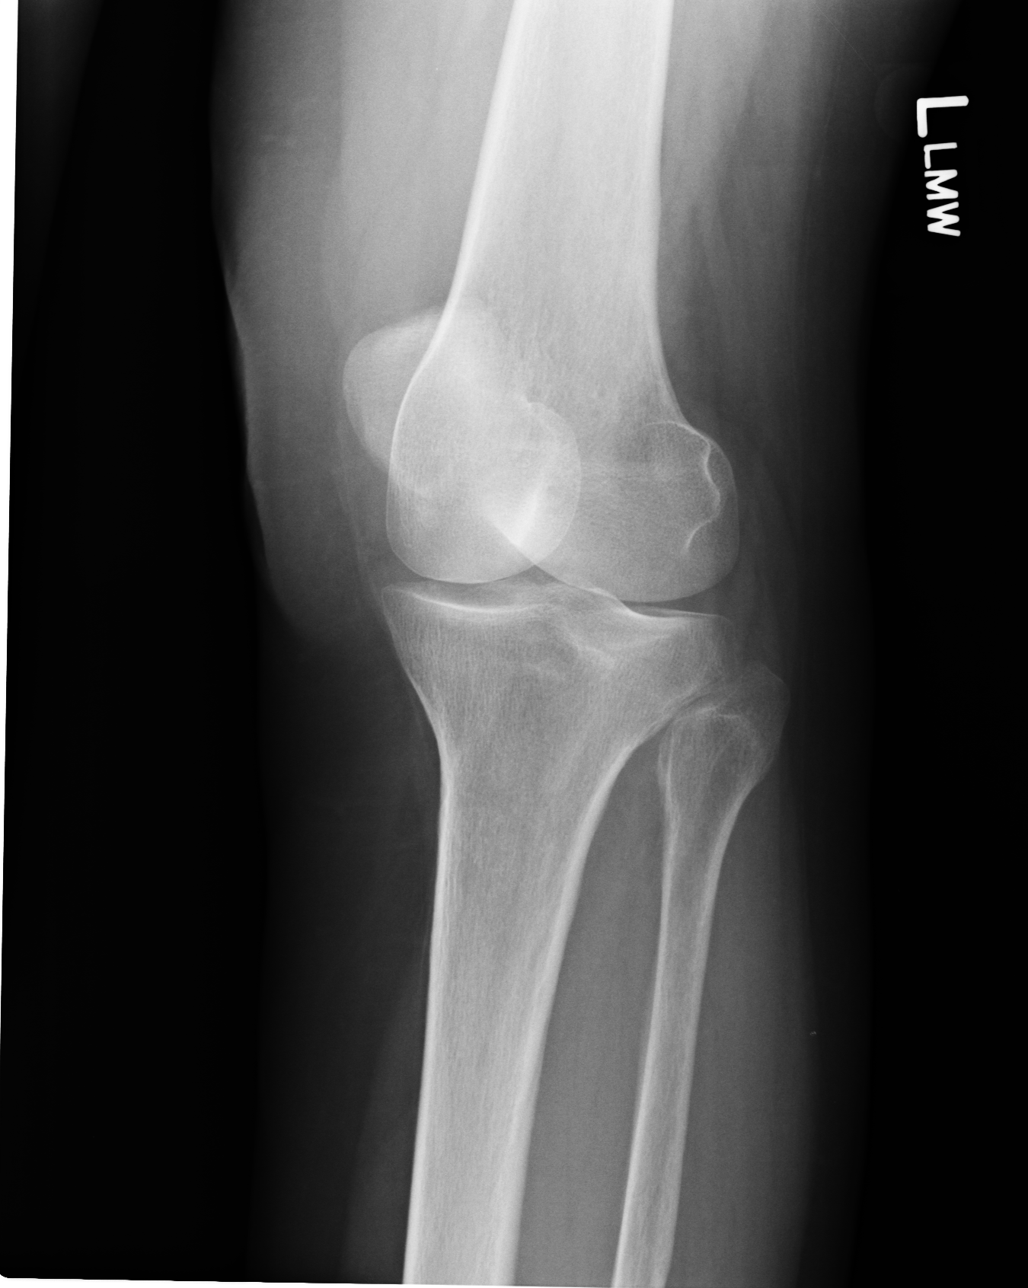
[im 3/4]
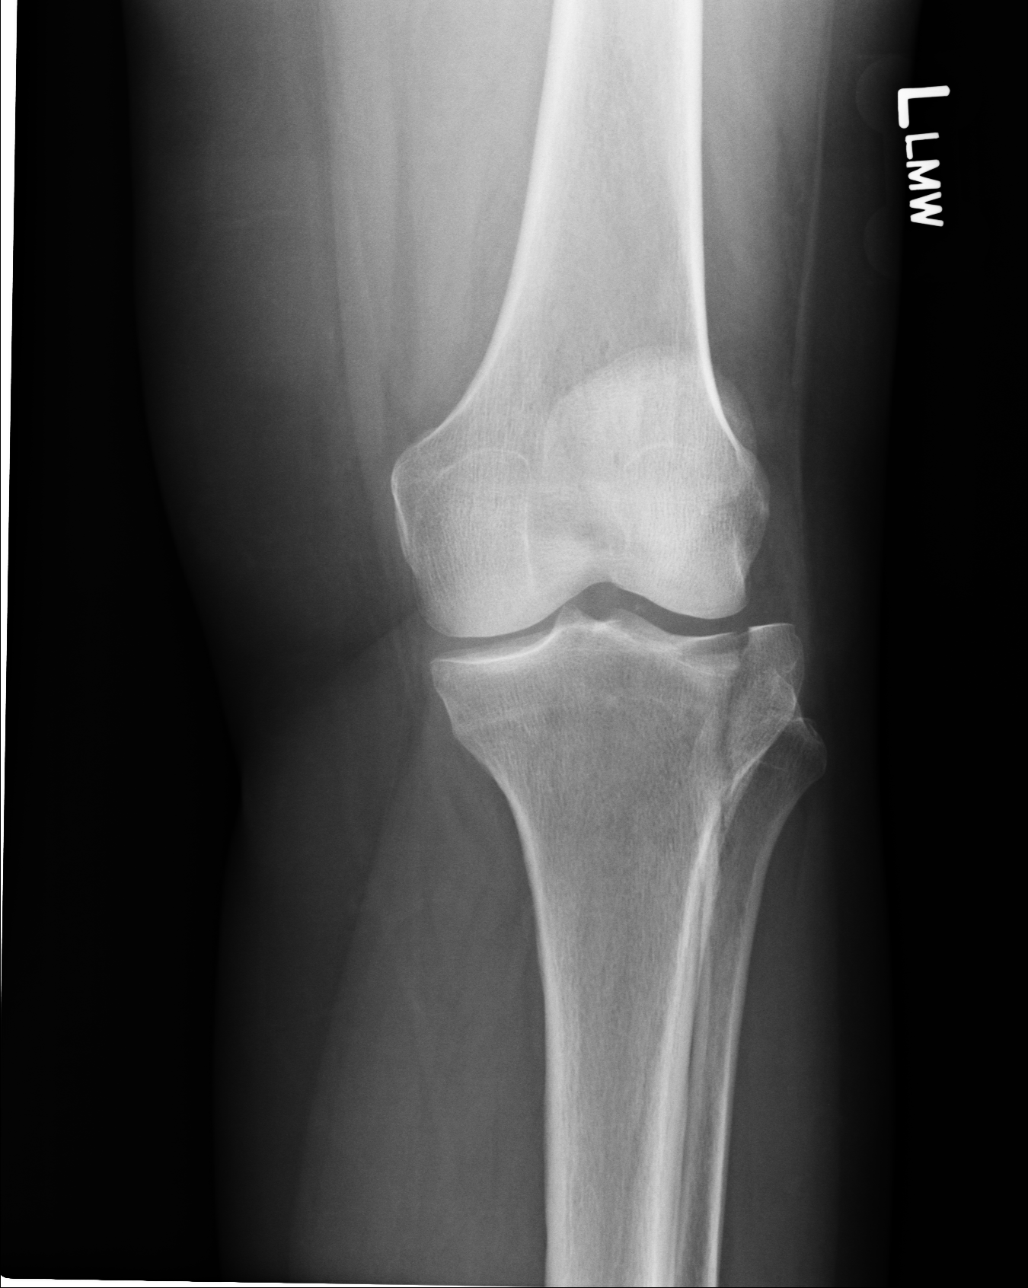
[im 4/4]
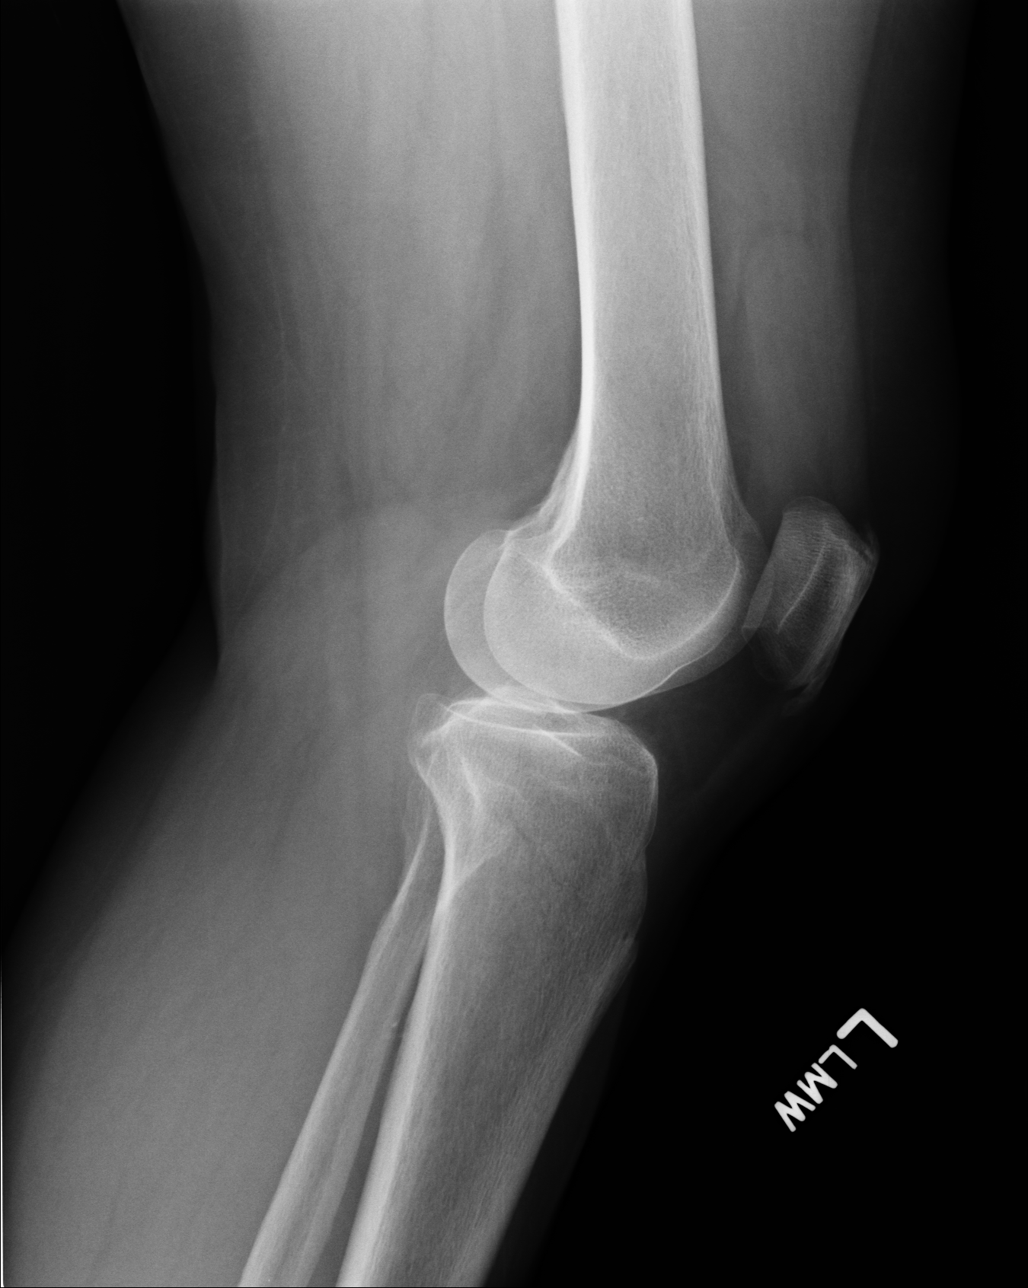

[4 of 4 positions shown; findings below may reference images not displayed]

PROCEDURE:     DXR - DXR KNEE LT COMP WITH OBLIQUES  - June 13, 2010  [DATE]

RESULT:     There is a fracture of the lateral tibial plateau with there
being focal depression of the lateral tibial plateau. There is vertical
extension of the fracture into the metaphysis. No other fractures are seen.
The fibula is intact. The knee joint space is well maintained.
IMPRESSION: Fracture of the lateral tibial plateau.

## 2012-07-07 IMAGING — CT CT OF THE LEFT KNEE WITHOUT CONTRAST
2 series · 15 of 20 positions shown, 18 images · non-contrast
Comparison: none

REASON FOR EXAM: EVAL FRACTURE
COMMENTS:   May transport without cardiac monitor

PROCEDURE:     CT  - CT KNEE LEFT WO  - June 14, 2010  [DATE]
RESULT:     Comparison: Left knee radiographs performed same day.
TECHNIQUE: Multiple axial images obtained of the left knee, without
intravenous contrast. Coronal and sagittal reformats were performed.

[Series 3: bone · axial · 0.35mm/px · z∈[+544,+682]mm · 12 of 83 slices shown, 15 images]
[im 7/83  soft-tissue]
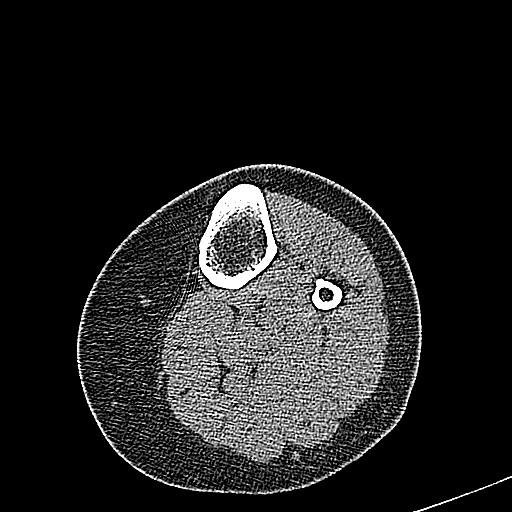
[im 7/83  bone]
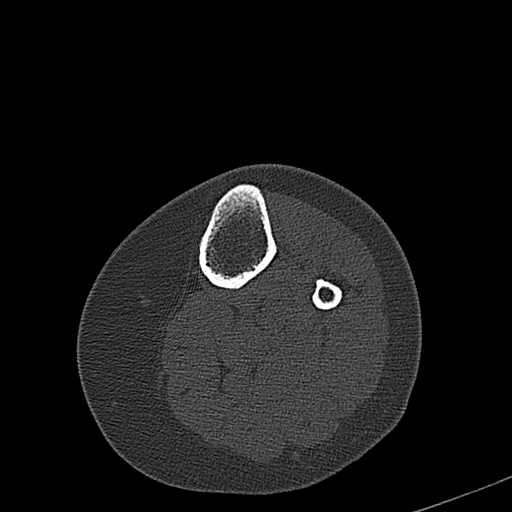
[im 13/83  bone]
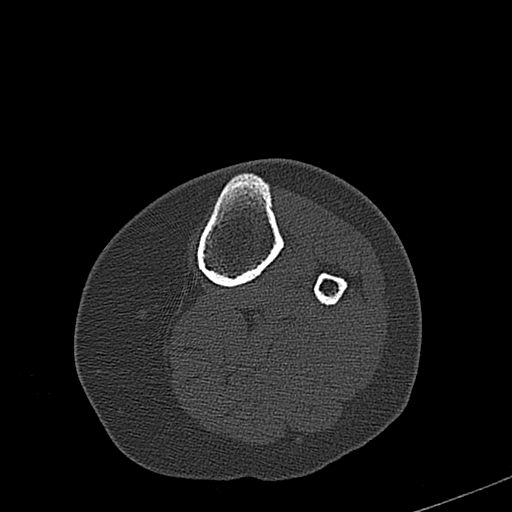
[im 19/83  bone]
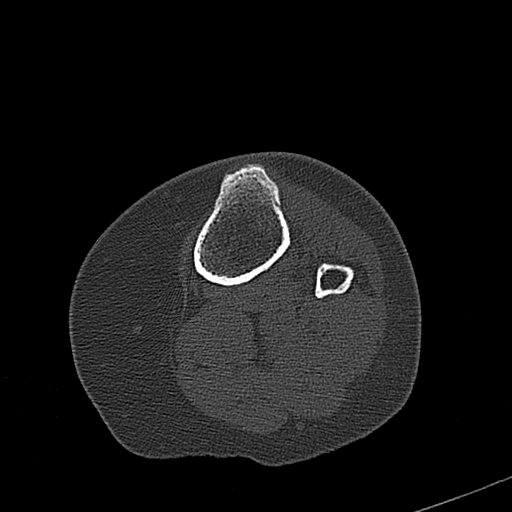
[im 26/83  bone]
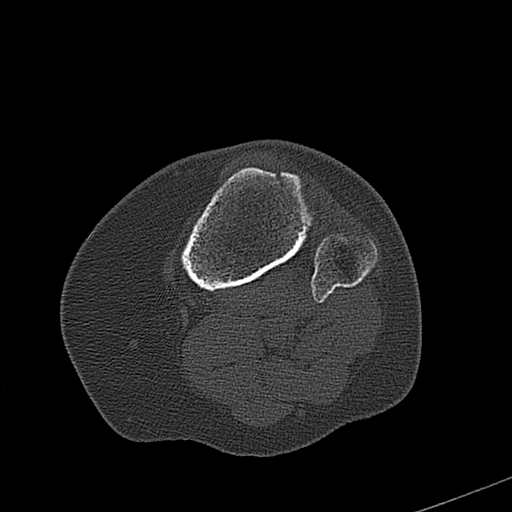
[im 32/83  soft-tissue]
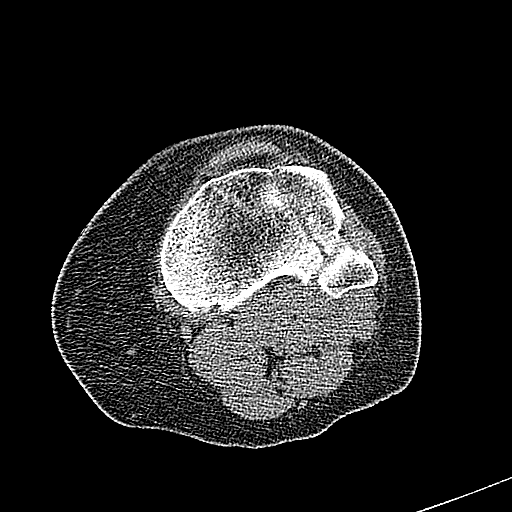
[im 32/83  bone]
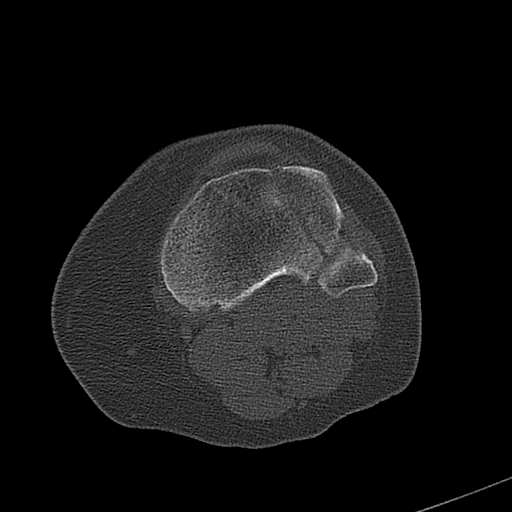
[im 38/83  bone]
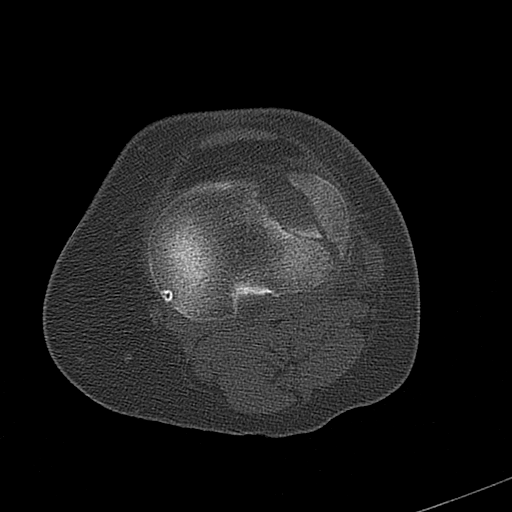
[im 45/83  bone]
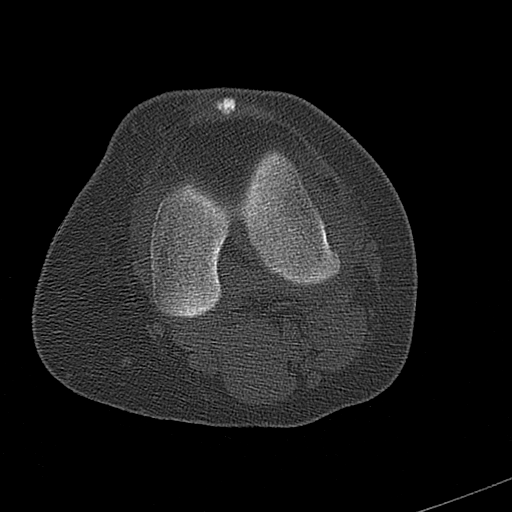
[im 51/83  bone]
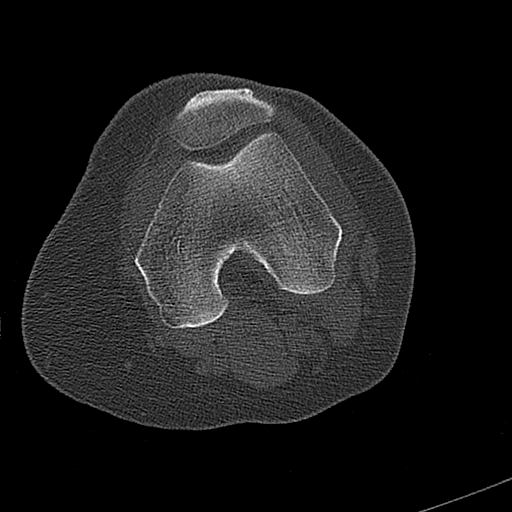
[im 57/83  soft-tissue]
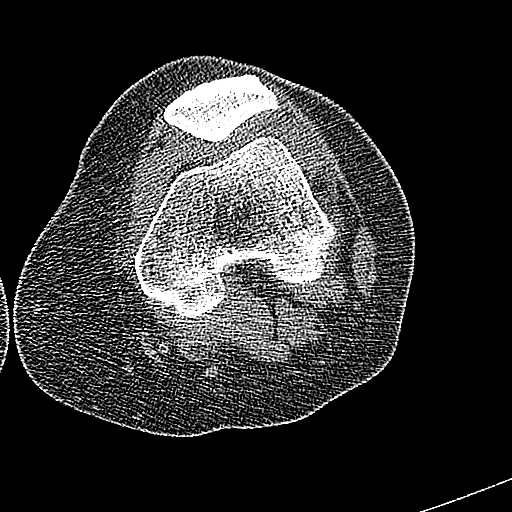
[im 57/83  bone]
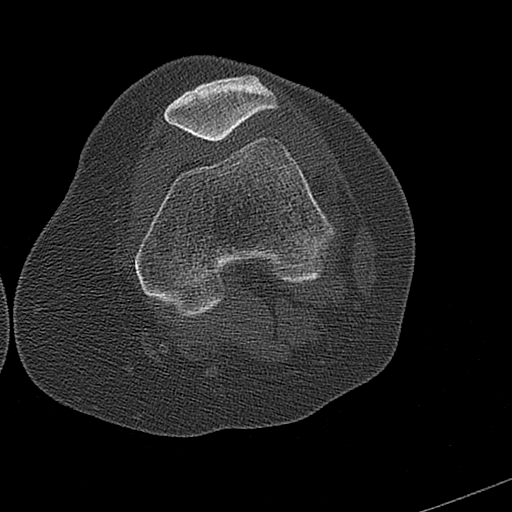
[im 64/83  bone]
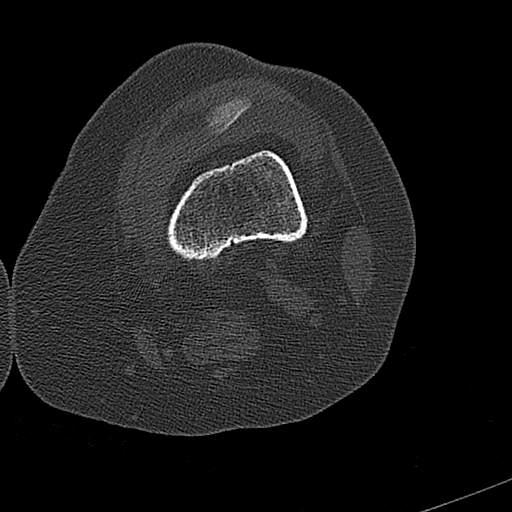
[im 70/83  bone]
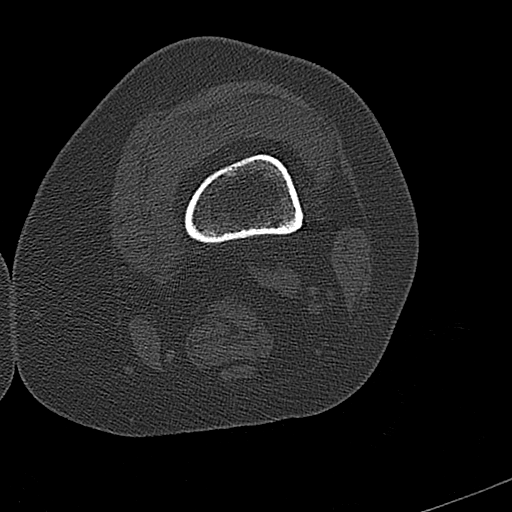
[im 76/83  bone]
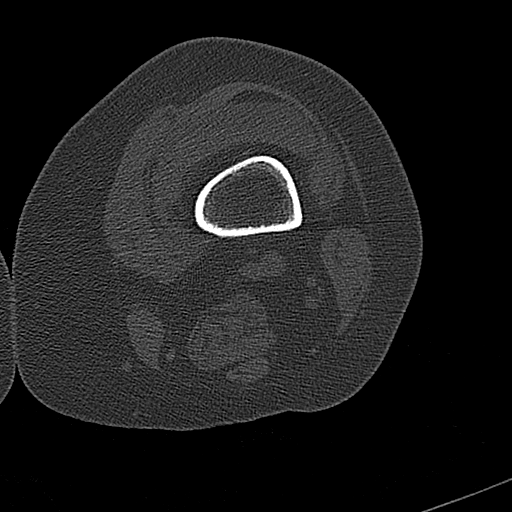

[Series 602: coronals left knee · coronal · 0.35mm/px · 3 of 49 slices shown]
[im 10/49  bone]
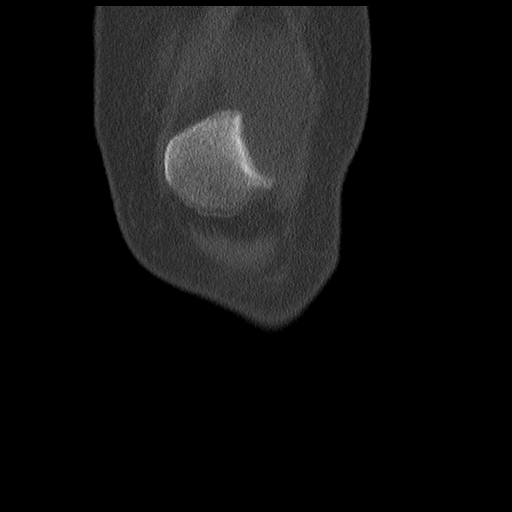
[im 20/49  bone]
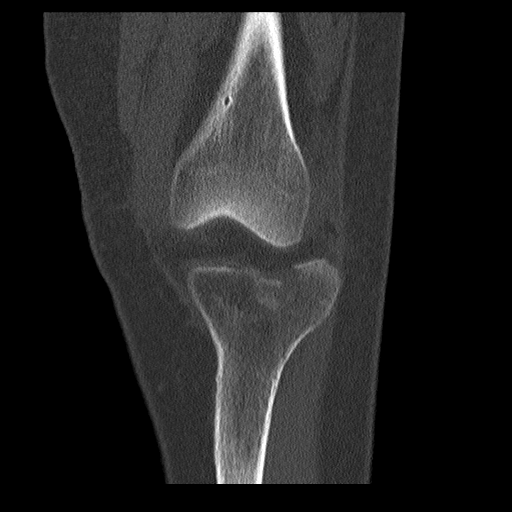
[im 29/49  bone]
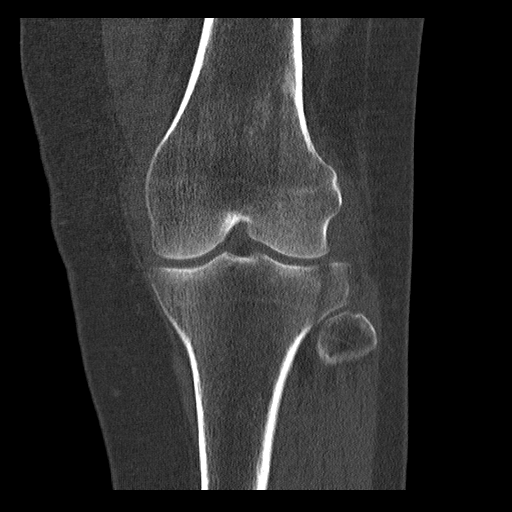

[15 of 20 positions shown; findings below may reference images not displayed]

FINDINGS: There is a depressed fracture of the lateral tibial plateau, involving
articular surface. There is approximately 4 mm of depression of the
articular surface. There is a small joint effusion.

Small crescentic ossific density adjacent to the inferior lateral aspect of
the patella likely sequela of old prior trauma.
IMPRESSION: 1. Depressed lateral tibia plateau fracture.
2. Small crescentic density along the inferior lateral lateral patella
likely sequela of old prior trauma. Correlate with point tenderness to
exclude a small avulsion type fracture.

## 2012-07-09 IMAGING — MR MRI OF THE LEFT KNEE WITHOUT CONTRAST
5 of 6 series · 32 of 40 positions shown · non-contrast
Comparison: none

REASON FOR EXAM: Left Knee Pain
COMMENTS:

PROCEDURE:     MR  - MR KNEE LT  WO CONTRAST  - June 16, 2010  [DATE]
RESULT:     Comparison: CT of the left knee 06/14/2010
TECHNIQUE: Standard knee protocol, without intravenous contrast.

[Series 3: T1 · axial · 4.0mm · 0.53mm/px · z∈[-48,+125]mm · 8 of 26 slices shown (1 of 2)]
[im 1/26]
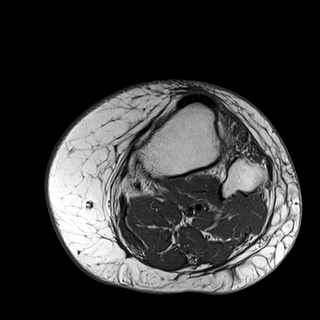
[im 4/26]
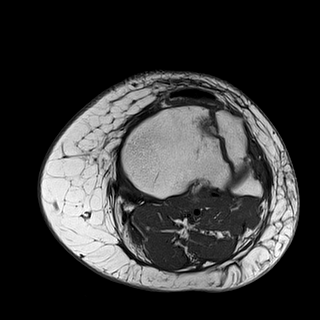
[im 8/26]
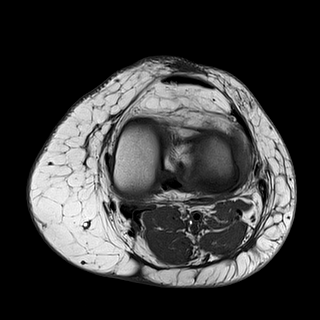
[im 11/26]
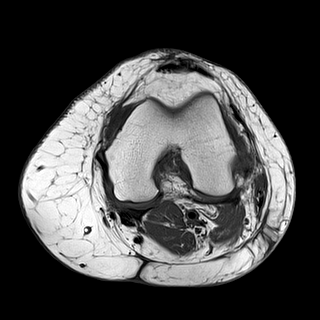
[im 15/26]
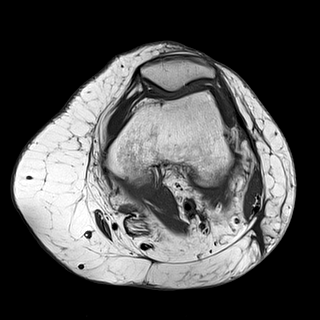
[im 18/26]
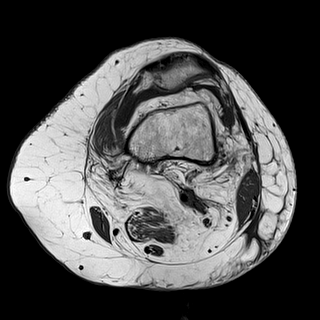
[im 22/26]
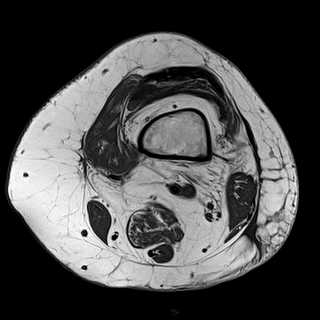
[im 26/26]
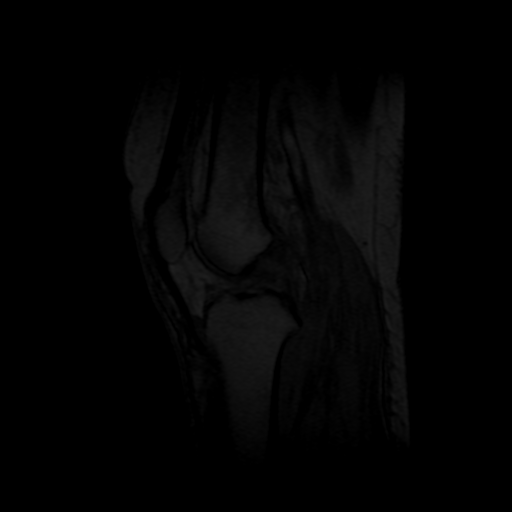

[Series 5: T2 fat-sat · axial · 4.0mm · 0.58mm/px · z∈[+5,+69]mm · 6 of 24 slices shown (1 of 2)]
[im 1/24]
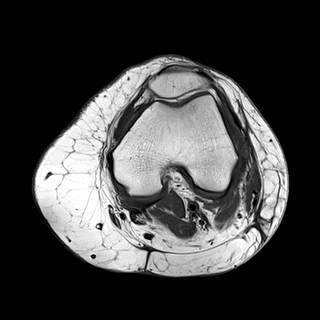
[im 5/24]
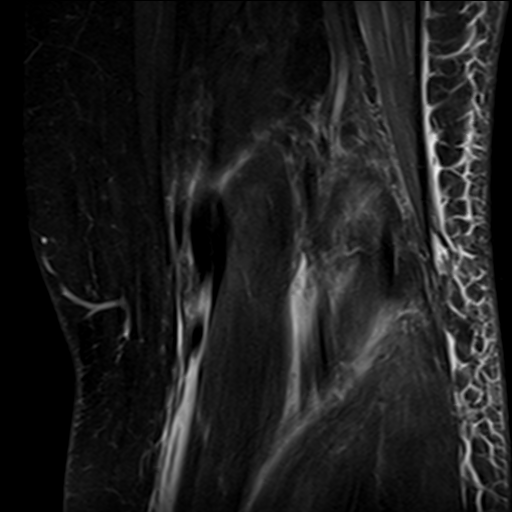
[im 10/24]
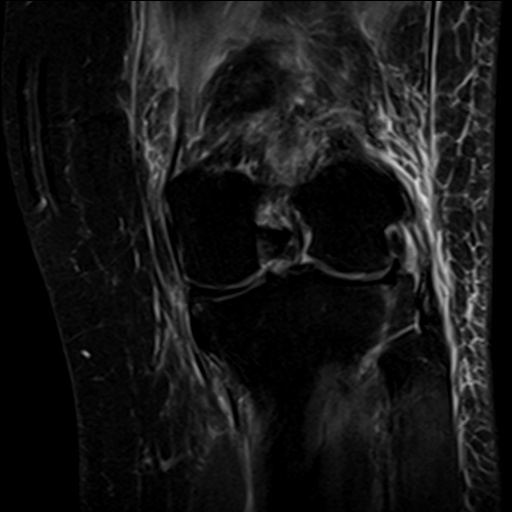
[im 14/24]
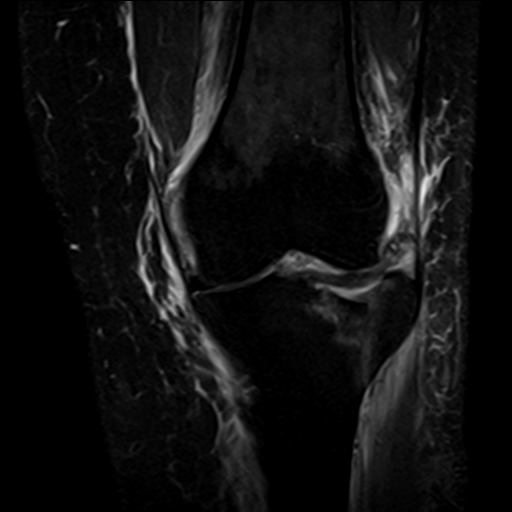
[im 19/24]
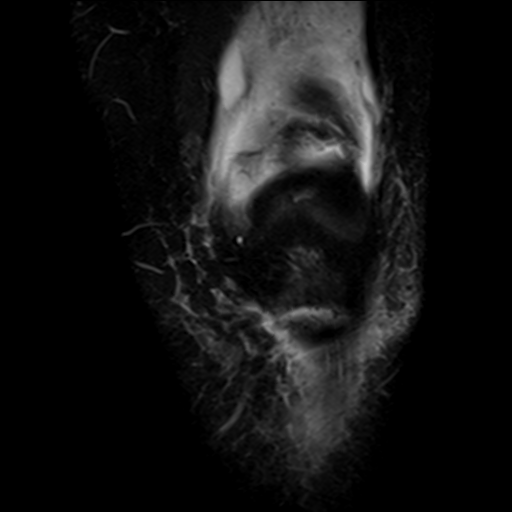
[im 24/24]
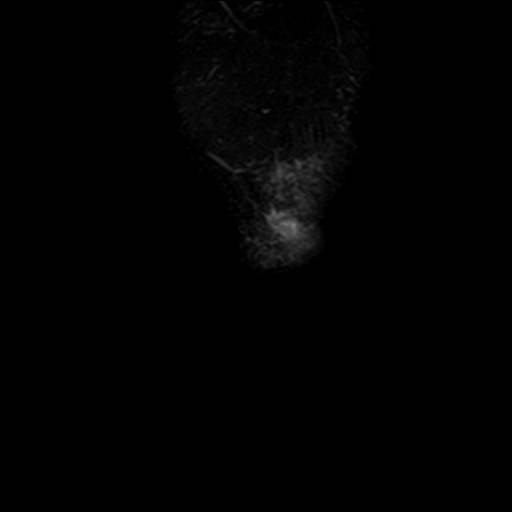

[Series 6: T1 · axial · 4.0mm · 0.58mm/px · z∈[+5,+69]mm · 6 of 24 slices shown (2 of 2)]
[im 1/24]
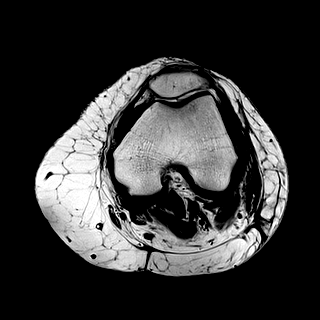
[im 5/24]
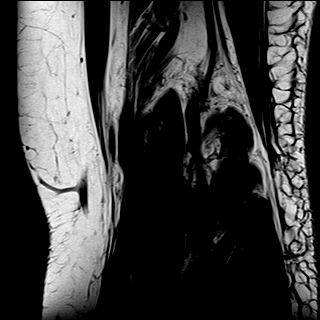
[im 10/24]
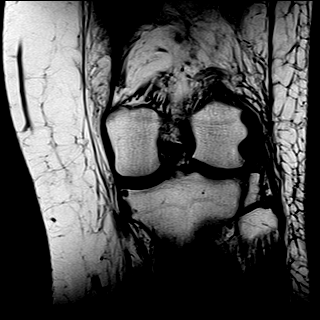
[im 14/24]
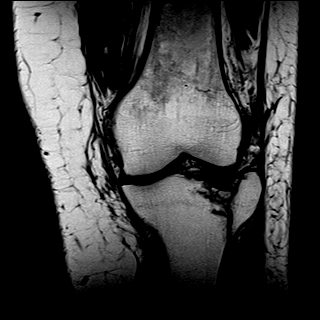
[im 19/24]
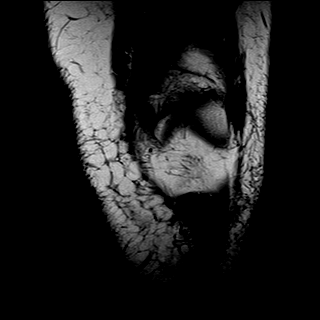
[im 24/24]
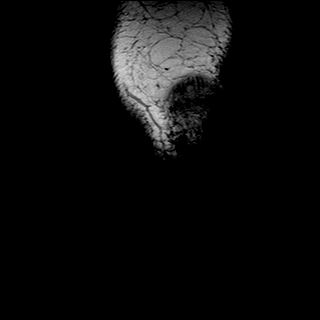

[Series 7: T2 fat-sat · axial · 4.0mm · 0.65mm/px · z∈[+5,+72]mm · 6 of 24 slices shown (2 of 2)]
[im 1/24]
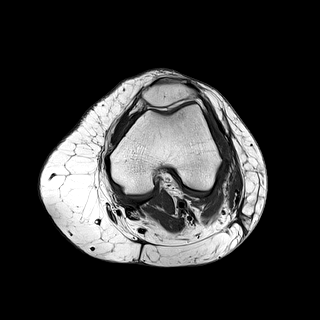
[im 5/24]
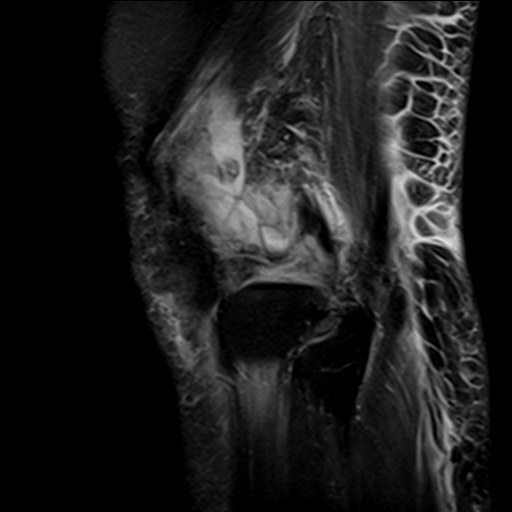
[im 10/24]
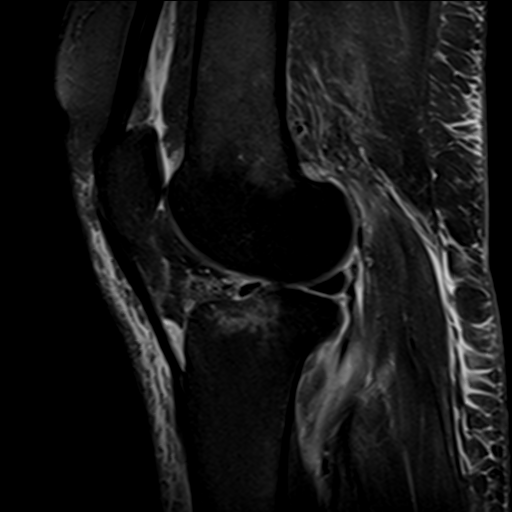
[im 14/24]
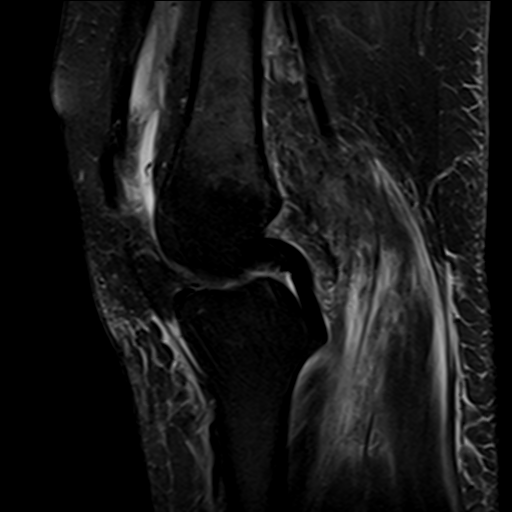
[im 19/24]
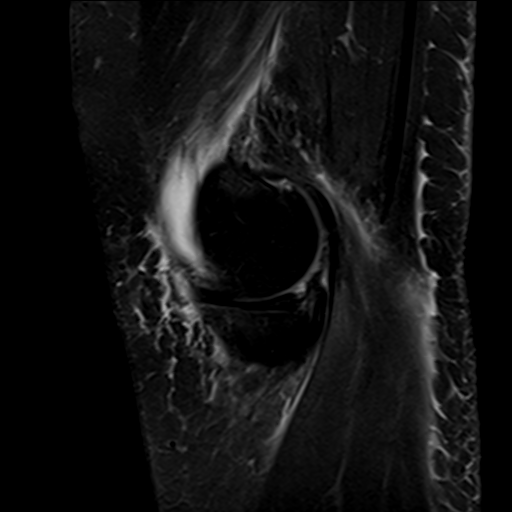
[im 24/24]
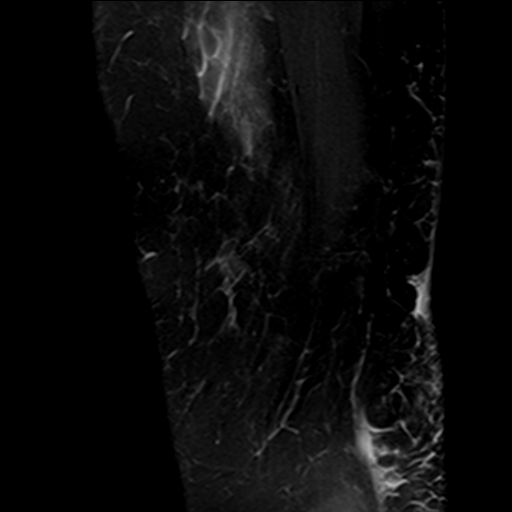

[Series 8: PD fat-sat · axial · 4.0mm · 0.65mm/px · z∈[+5,+72]mm · 6 of 24 slices shown]
[im 1/24]
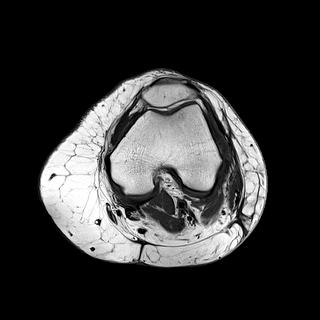
[im 5/24]
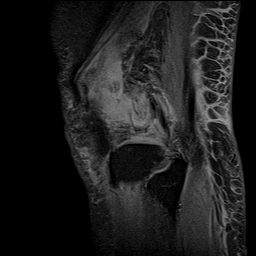
[im 10/24]
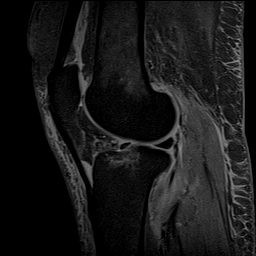
[im 14/24]
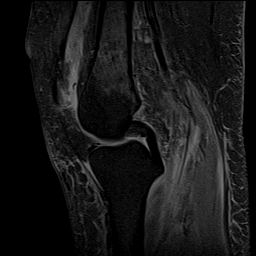
[im 19/24]
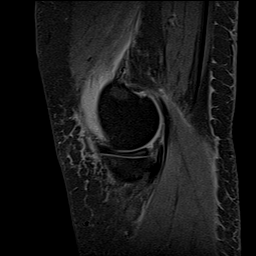
[im 24/24]
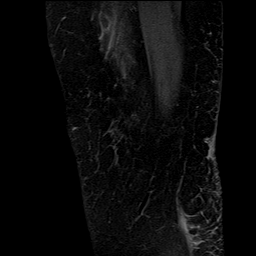

[32 of 40 positions shown; findings below may reference images not displayed]

FINDINGS: There is a depressed fracture of the lateral tibia plateau, similar to the
recent CT. There is lateral displacement of the lateral aspect of the tibia
plateau fracture fragments.

The ACL and PCL are intact. The MCL is intact. The lateral collateral
complex is intact. There is intermediate signal in the lateral collateral
ligament, consistent with injury.

The lateral meniscus is intact. The inferior aspect of the lateral periphery
of the body of the lateral meniscus body extends just within a fracture line
of the tibial plateau.  There is a radial tear of the posterior horn of the
medial meniscus.

The patellofemoral mechanism is intact. There is a small joint effusion.
Layering intermediate signal in the fluid likely represents blood products.
There is a small leaking Baker's cyst. Increased signal in and around the
muscles of the posterior compartment likely sequela of muscle strain. There
is fluid signal along the fat fascial interface, likely related to torsional
injury.

Small partial thickness cartilage defect seen along the lateral femoral
condyle.
IMPRESSION: 1. Depressed lateral tibia plateau fracture.
2. Radial tear posterior horn medial meniscus.

## 2012-07-13 IMAGING — CR DG C-ARM 1-60 MIN
2 series · 2 of 2 positions shown · non-contrast
Comparison: none

REASON FOR EXAM: LEFT TIBIA FX
COMMENTS:

[cont. (1 of 2)]
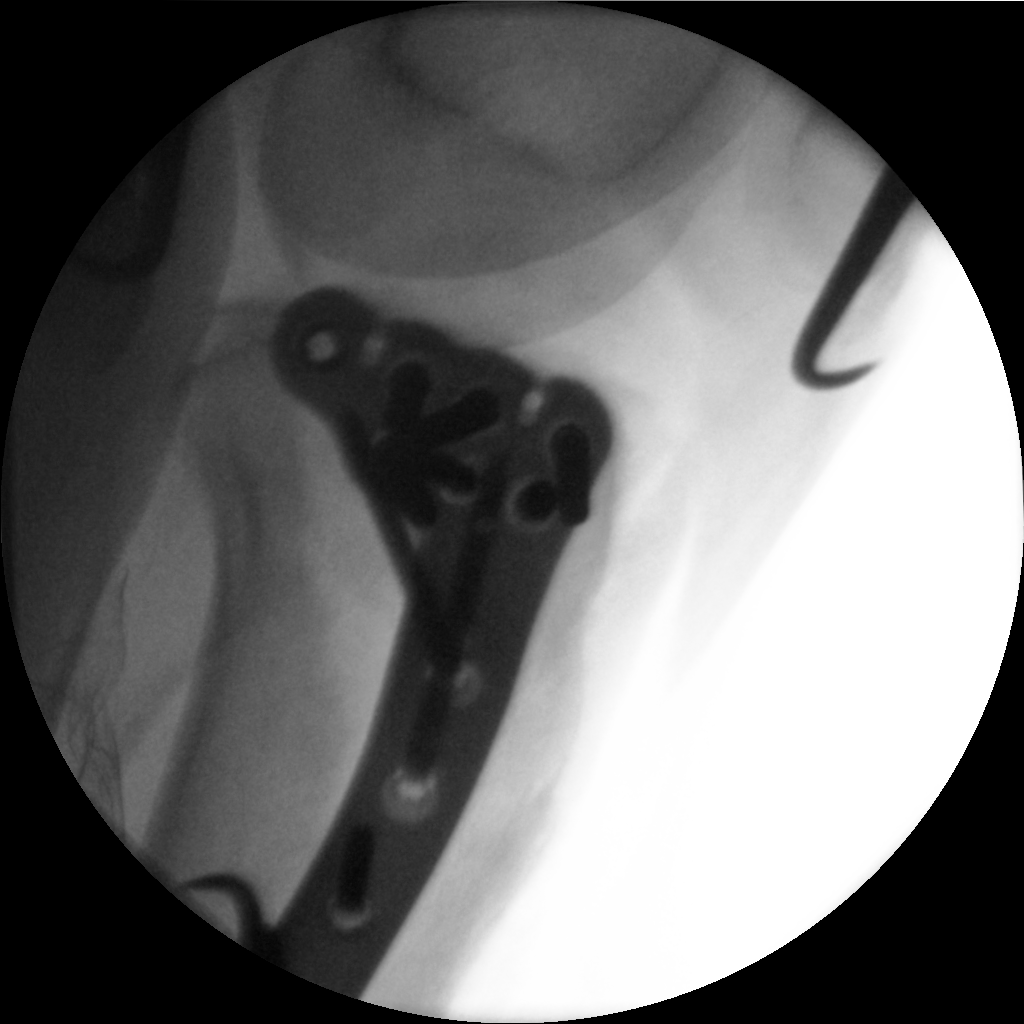

[cont. (2 of 2)]
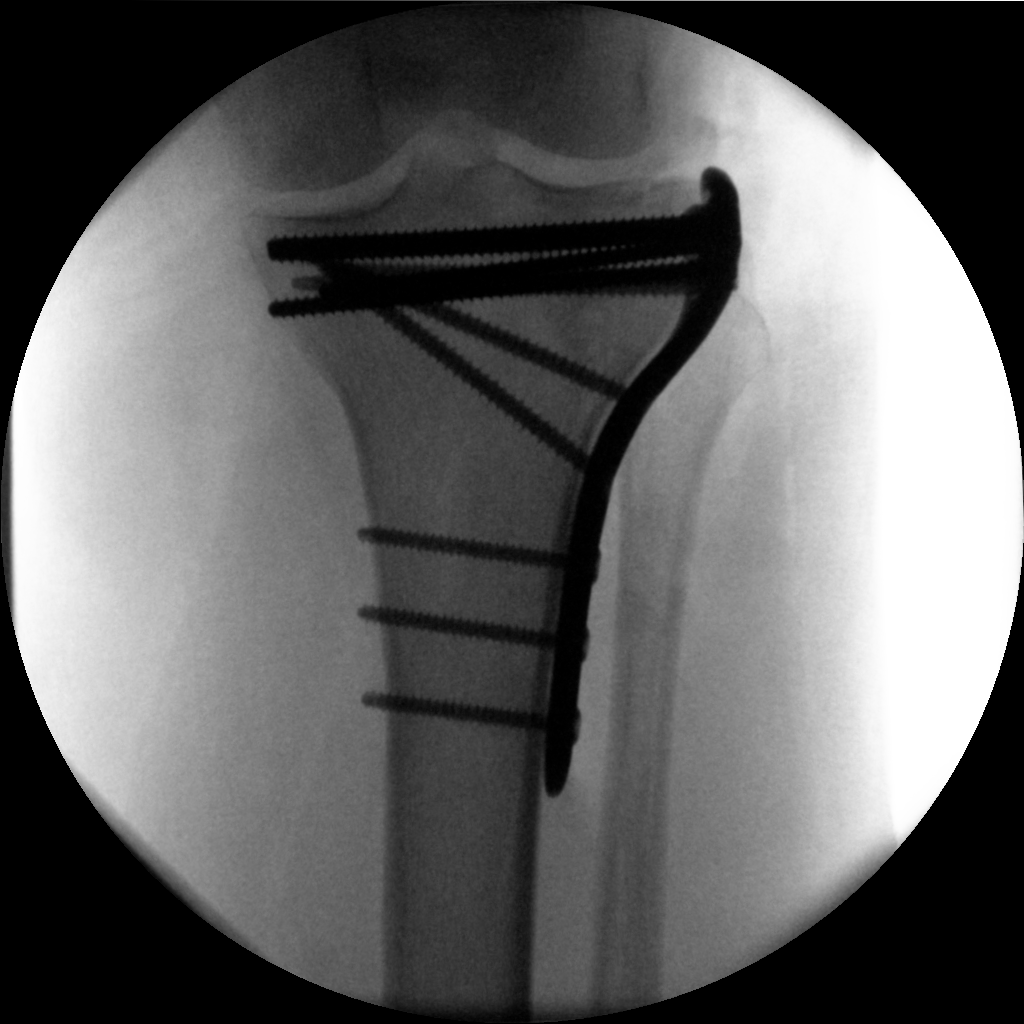

[2 of 2 positions shown; findings below may reference images not displayed]

PROCEDURE:     DXR - DXR C-ARM WITH SPOT IMAGES  - June 20, 2010  [DATE]

RESULT:     Two spot films from the operating room are submitted. The
patient has undergone ORIF for a lateral tibial plateau fracture. An
orthopedic side plate with metaphyseal and metadiaphyseal compression screws
is present.
IMPRESSION: The patient has undergone ORIF for a lateral tibial plateau
fracture. The plateau appears to have been reelevated to near-anatomic
alignment. Further interpretation is deferred to Dr. Mella.

## 2012-07-13 IMAGING — CR DG TIBIA/FIBULA 2V*L*
1 series · 2 of 2 positions shown · non-contrast
Comparison: none

REASON FOR EXAM: post op
COMMENTS:   Bedside (portable):Y

[Series 1: view not recorded · 0.17mm/px · 2 of 2 slices shown]
[im 1/2]
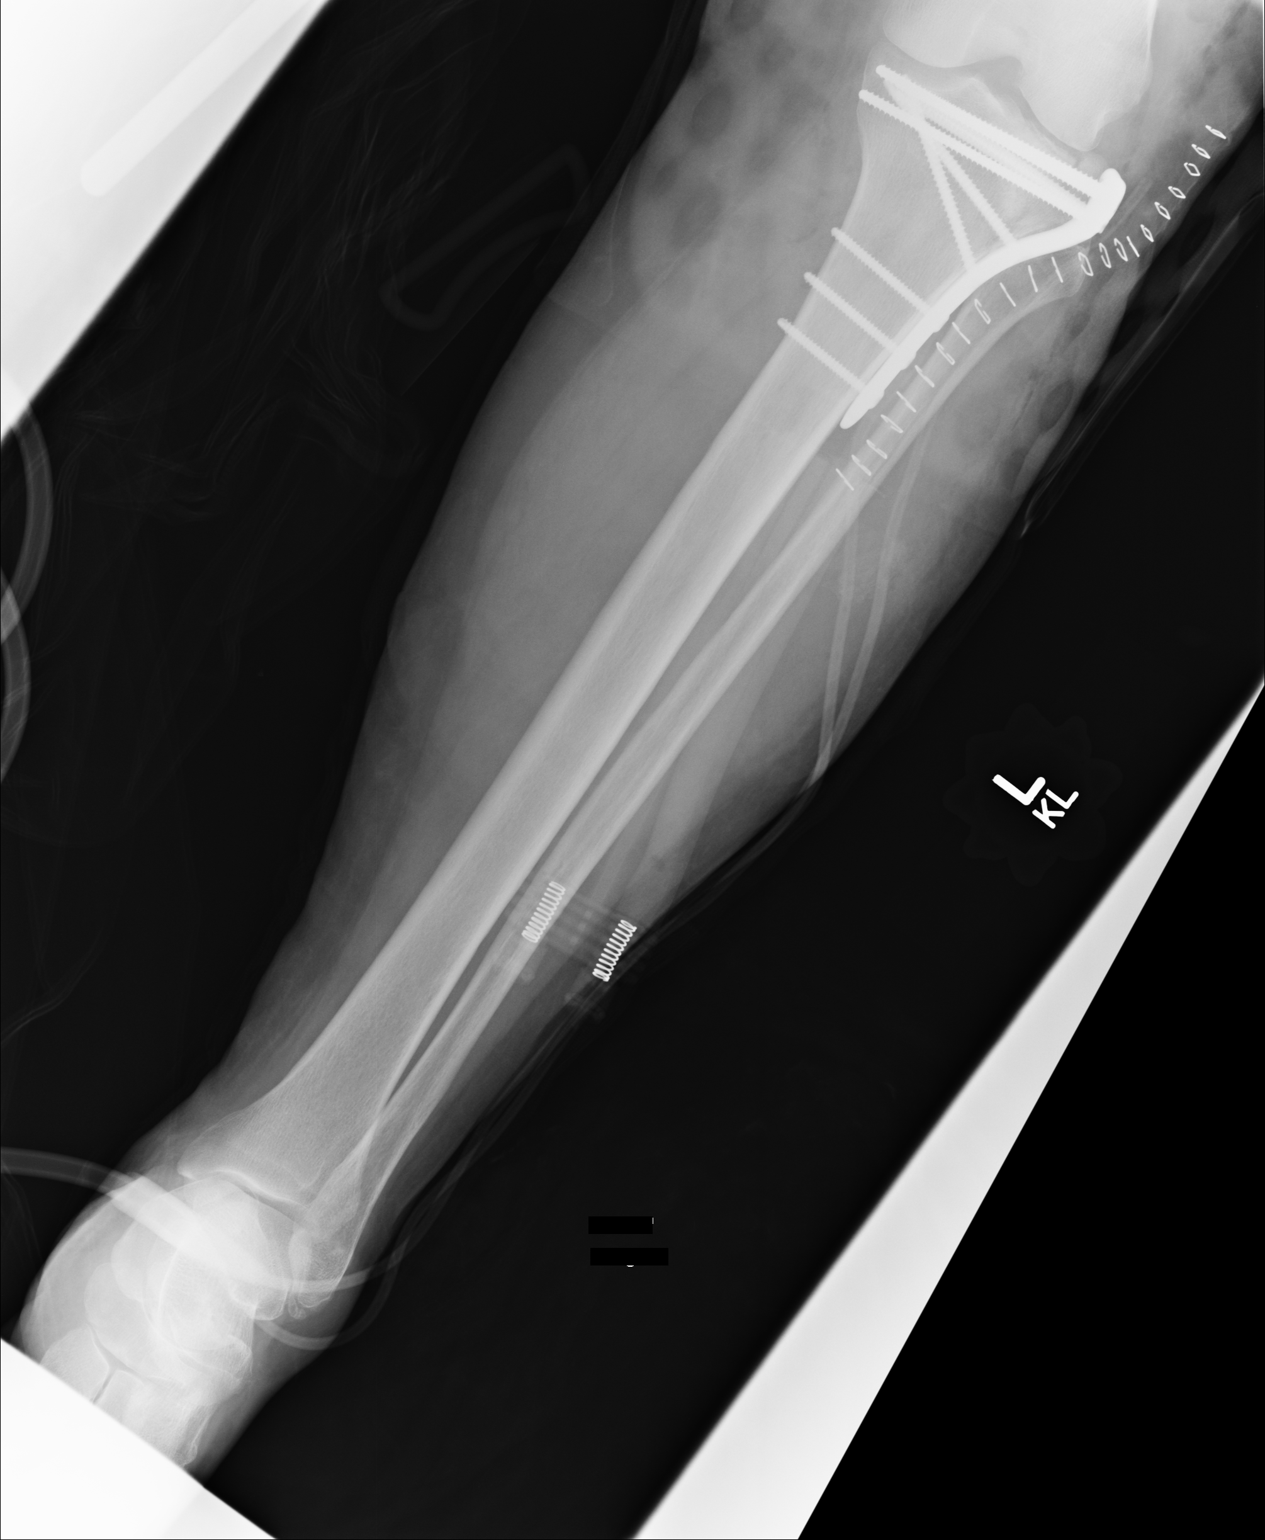
[im 2/2]
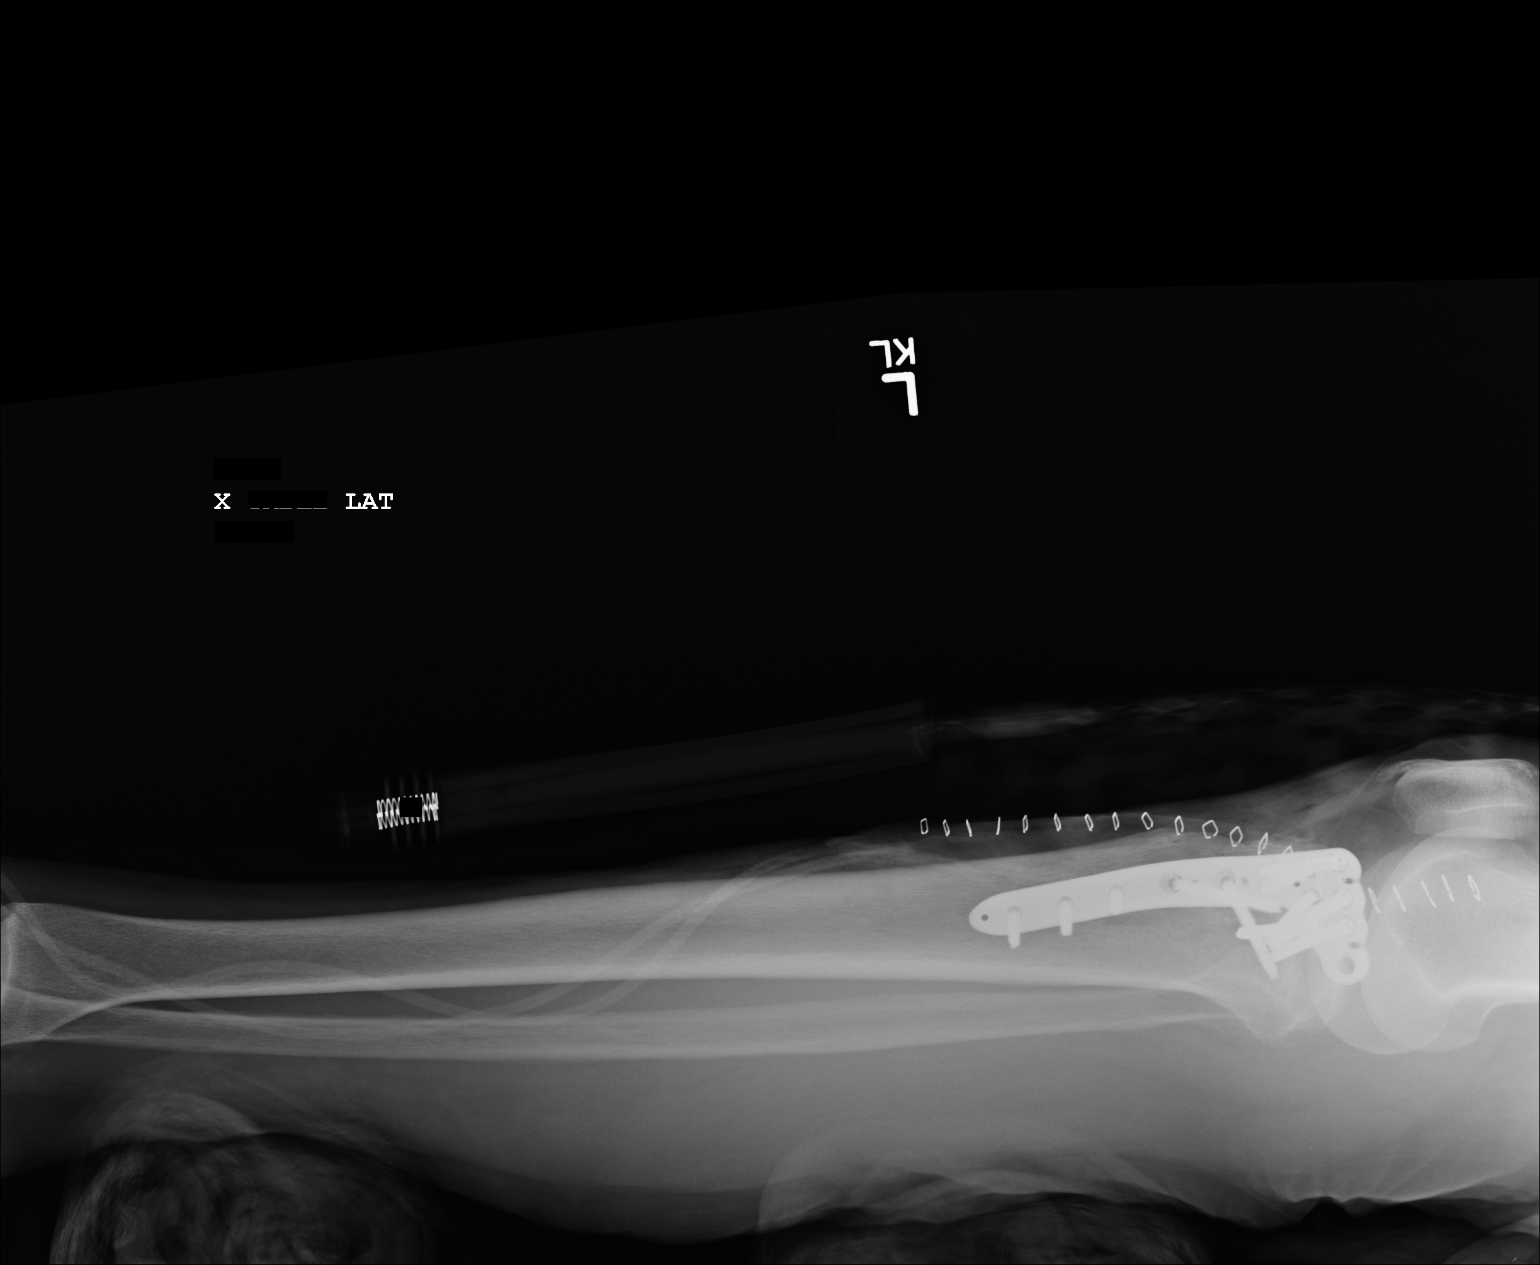

[2 of 2 positions shown; findings below may reference images not displayed]

PROCEDURE:     DXR - DXR TIBIA AND FIBULA LT (LOWER L  - June 20, 2010  [DATE]

RESULT:     A postsurgical changes are seen along the tibial plateau and
proximal tibial shaft. Skin staples are present laterally along the region.
Surgical drains are present. There is a lateral sideplate with multiple
screws in the proximal tibia an attempt to buttress the tibial plateau.
IMPRESSION: Postsurgical changes as described.

## 2012-07-25 ENCOUNTER — Encounter: Payer: Self-pay | Admitting: *Deleted

## 2012-11-07 IMAGING — CR DG CHEST 2V
1 series · 2 of 2 positions shown · non-contrast
Comparison: none

REASON FOR EXAM: SOB tachycardia
COMMENTS:

[Series 1: view not recorded · 0.17mm/px · 2 of 2 slices shown]
[im 1/2]
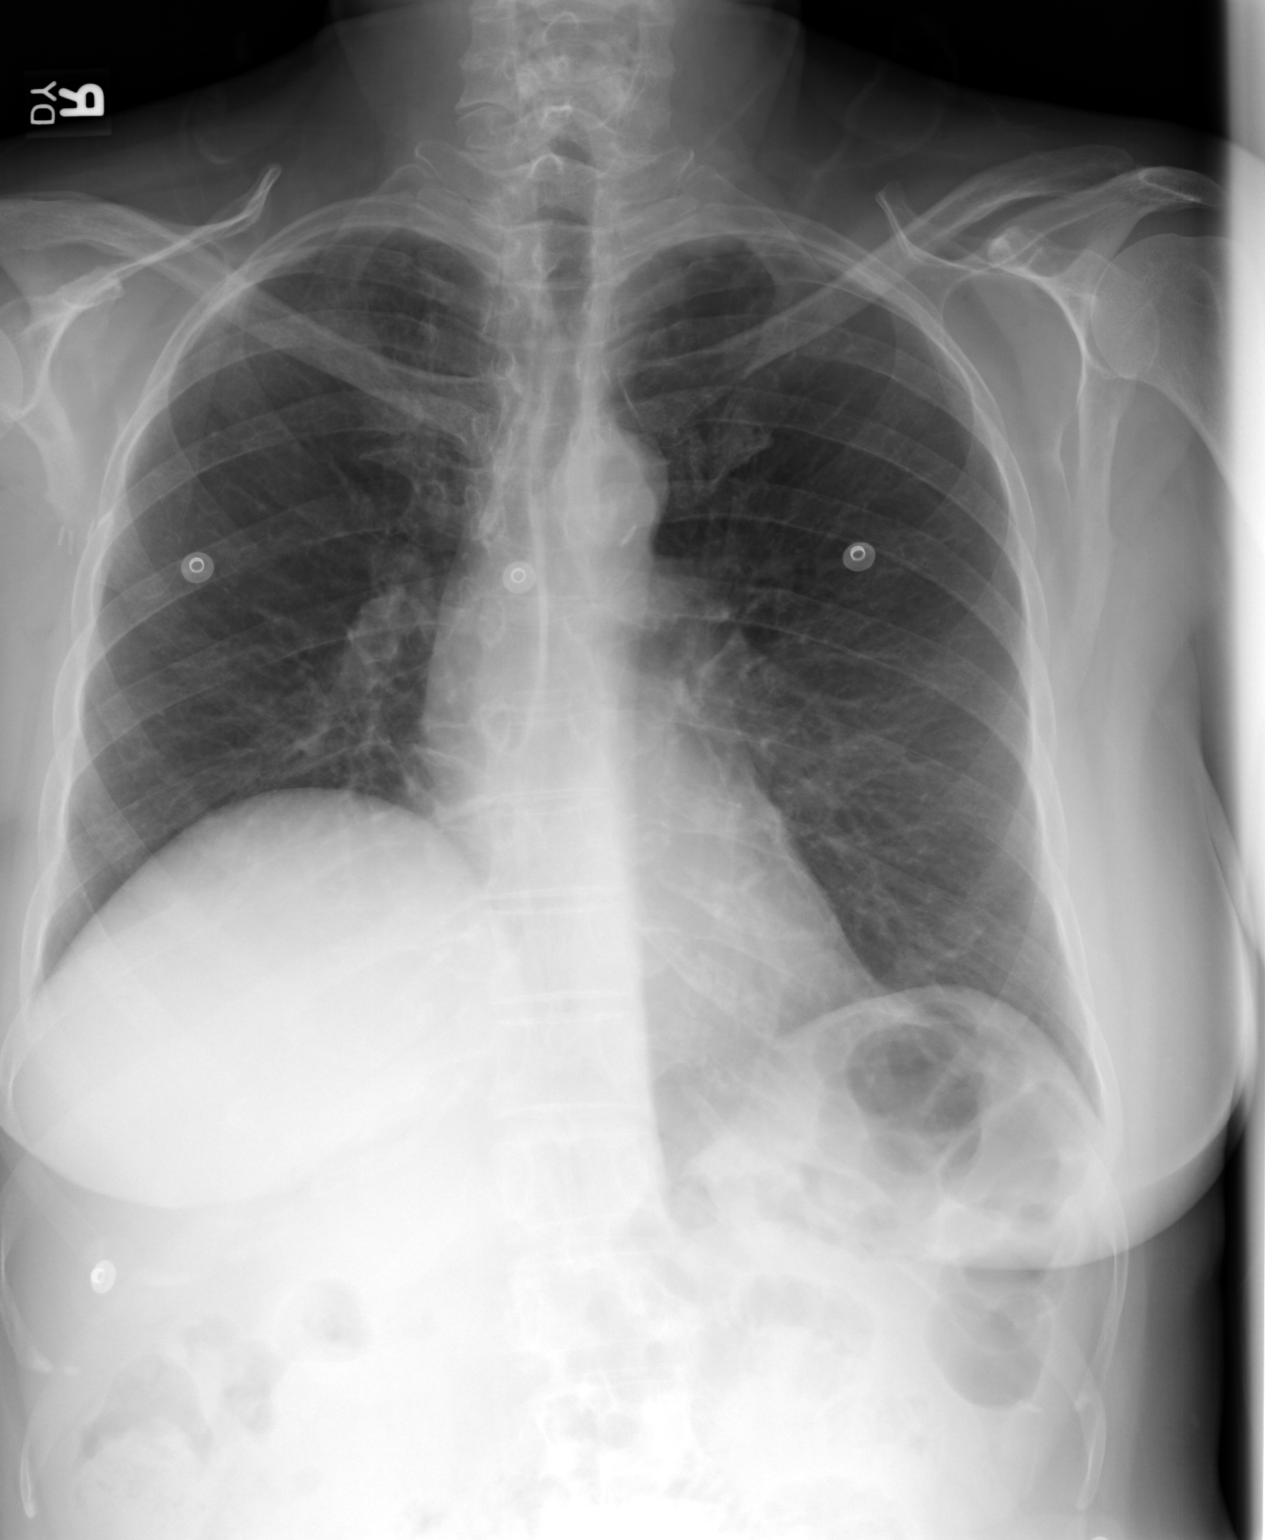
[im 2/2]
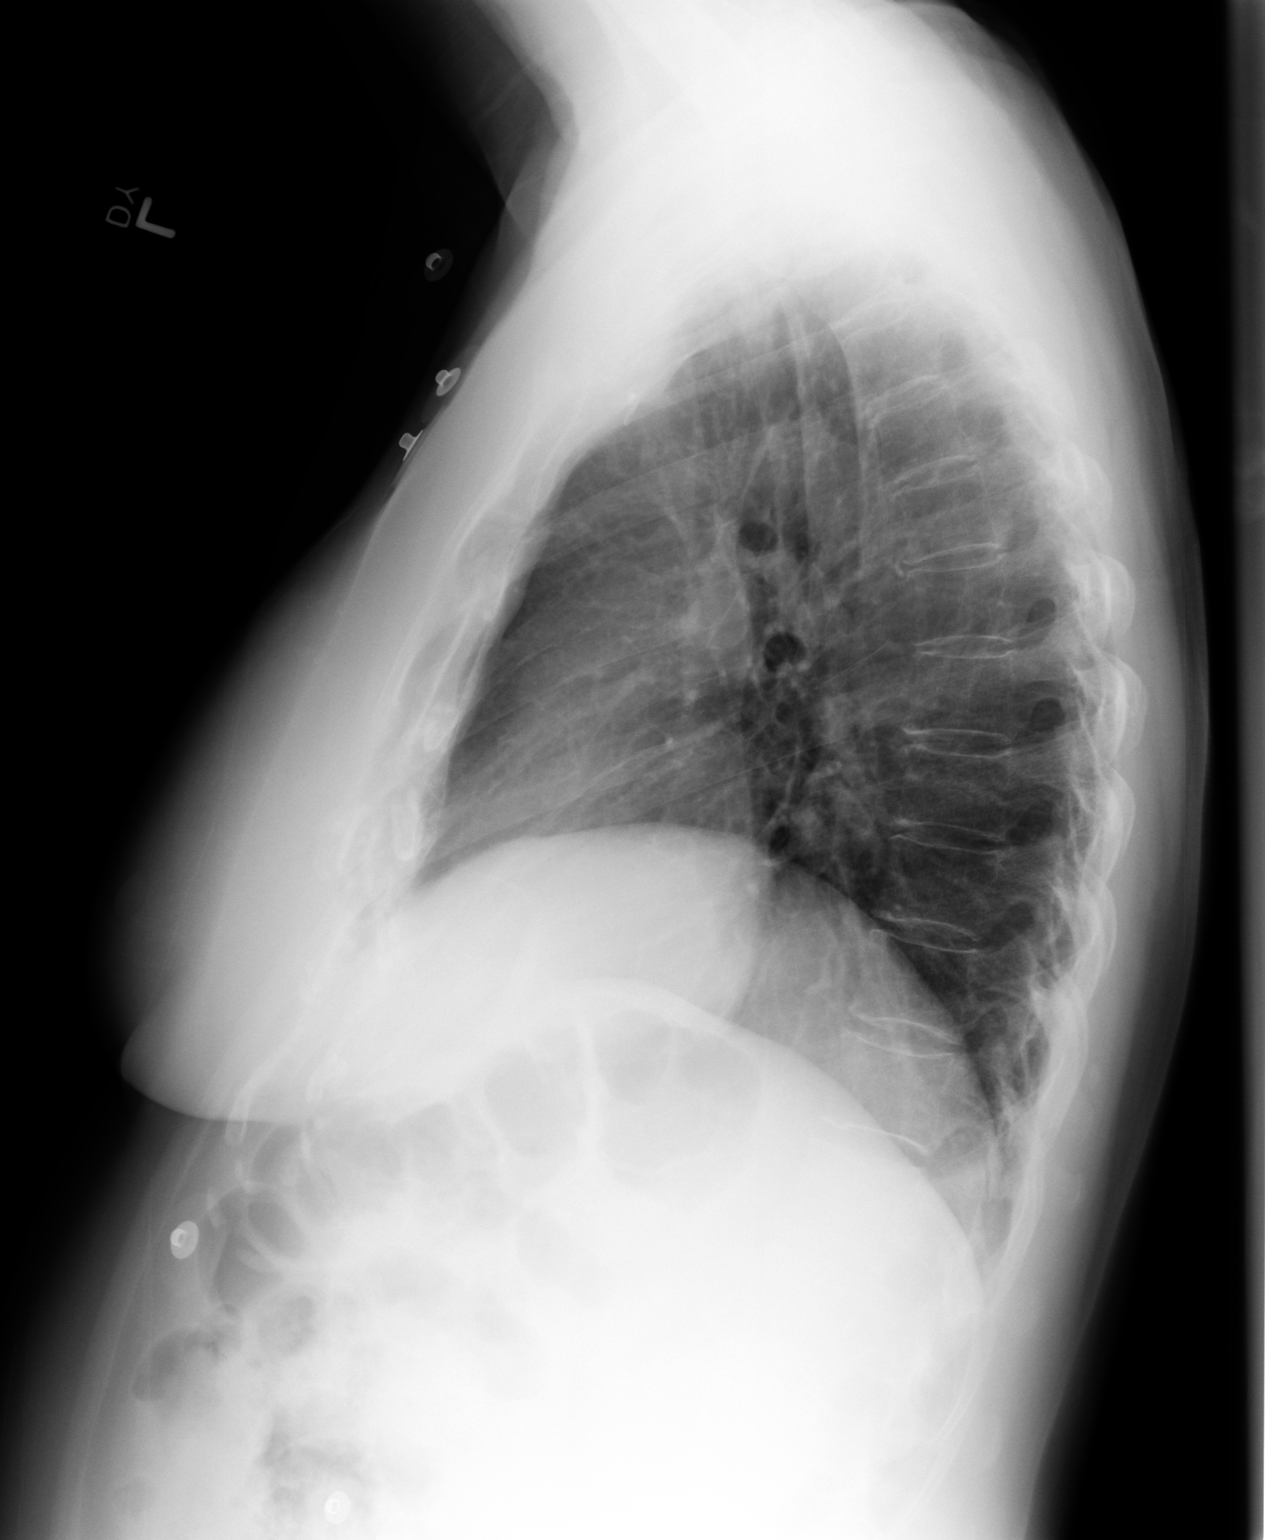

[2 of 2 positions shown; findings below may reference images not displayed]

PROCEDURE:     DXR - DXR CHEST PA (OR AP) AND LATERAL  - October 15, 2010  [DATE]

RESULT:     Comparison is made to a prior study of 05/27/2010.

There is elevation of the right hemidiaphragm. No focal regions of
consolidation or focal infiltrates are appreciated.  The cardiac silhouette
is within normal limits. The visualized bony skeleton is unremarkable.
IMPRESSION: Elevated right hemidiaphragm without evidence of acute cardiopulmonary
disease.

## 2012-11-08 IMAGING — CR DG CHEST 1V PORT
1 series · 1 of 1 positions shown · non-contrast
Comparison: none

REASON FOR EXAM: acute laryngospasm
COMMENTS:

[view not recorded]
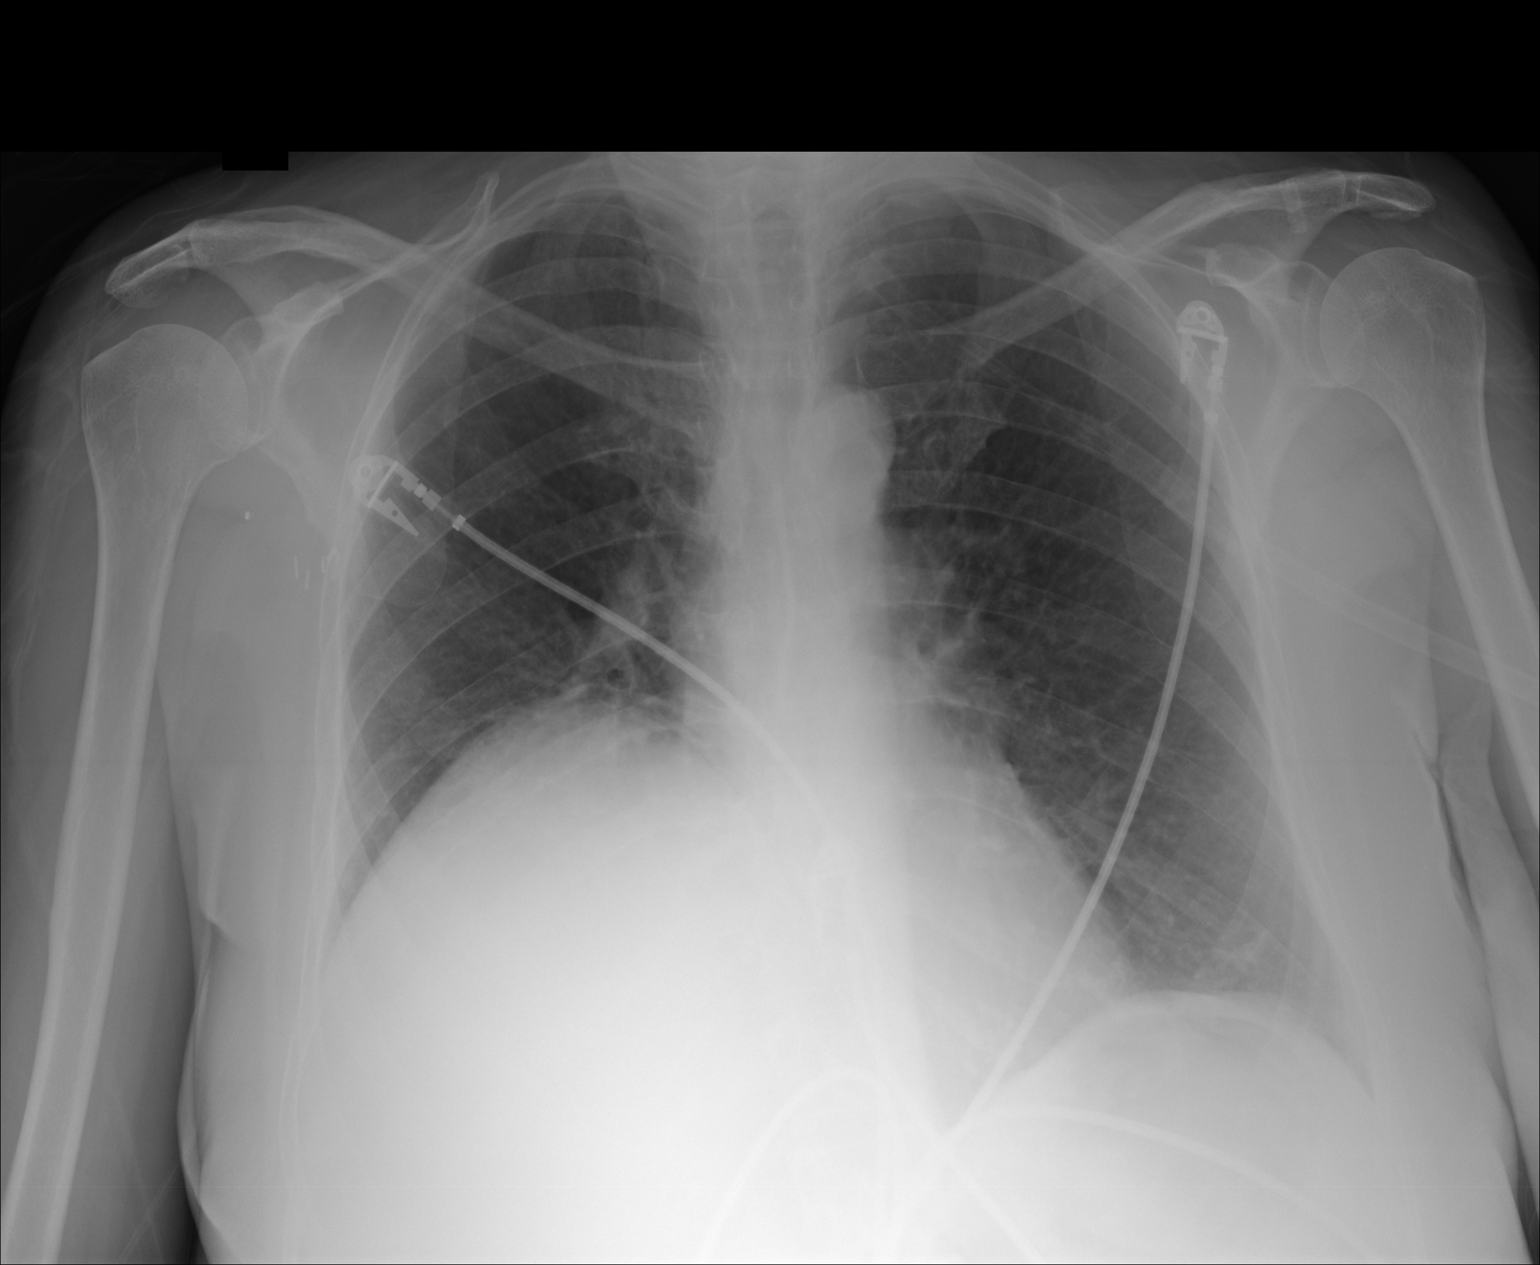

[1 of 1 positions shown; findings below may reference images not displayed]

PROCEDURE:     DXR - DXR PORTABLE CHEST SINGLE VIEW  - October 16, 2010  [DATE]

RESULT:     Comparison is made to the prior exam of 10/15/2010.

The lung fields are clear. There is observed elevation of the right
hemidiaphragm which has been present previously. The heart size is within
normal limits. Monitoring electrodes are present.
IMPRESSION: No acute changes are identified.

## 2012-11-10 IMAGING — CT CT NECK WITHOUT CONTRAST
1 of 2 series · 9 of 14 positions shown, 12 images · non-contrast
Comparison: none

REASON FOR EXAM: laryngospasm, evaluate for mass
COMMENTS:

PROCEDURE:     CT  - CT NECK WITHOUT CONTRAST  - October 18, 2010  [DATE]
RESULT:     Comparison: None.
TECHNIQUE: Multiple axial images obtained of the neck from the frontal
sinuses through the aortic arch, without intravenous contrast.

[Series 2: soft tissue · axial · 0.49mm/px · z∈[+1124,+1356]mm · 9 of 97 slices shown, 12 images]
[im 10/97  soft-tissue]
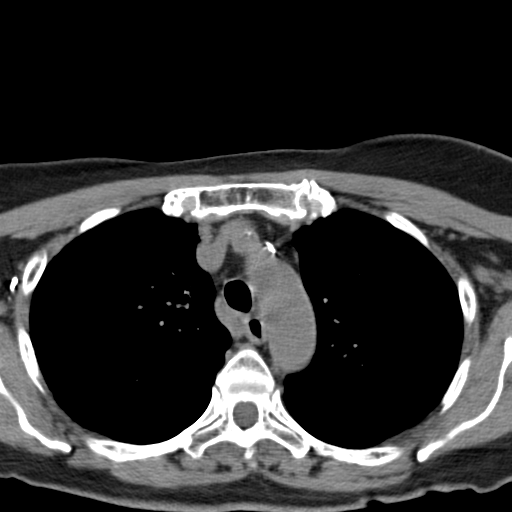
[im 10/97  bone]
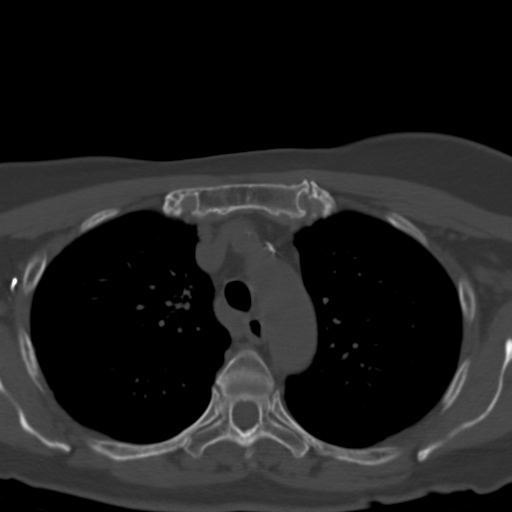
[im 20/97  bone]
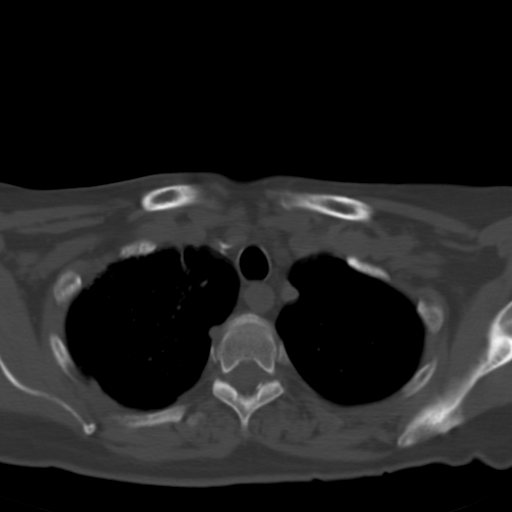
[im 29/97  bone]
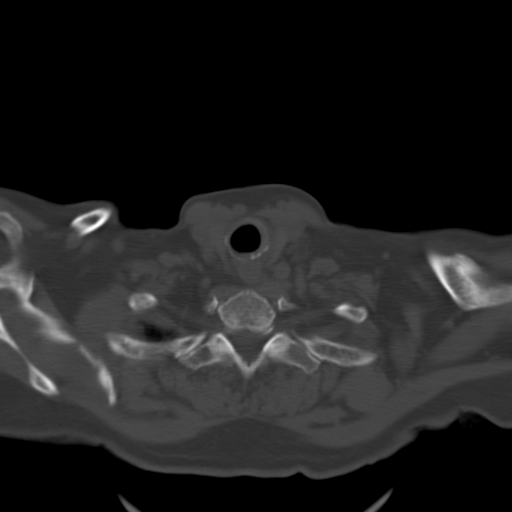
[im 39/97  bone]
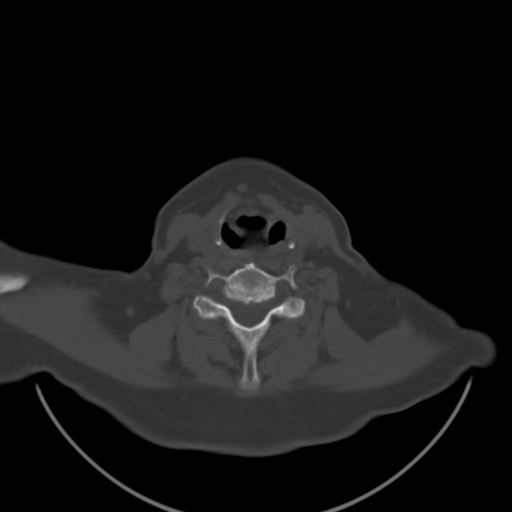
[im 49/97  soft-tissue]
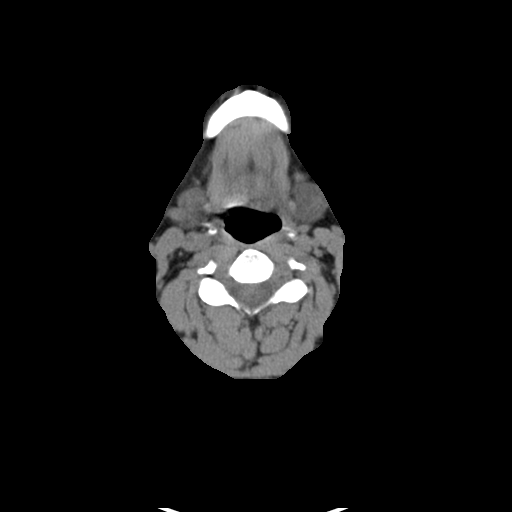
[im 49/97  bone]
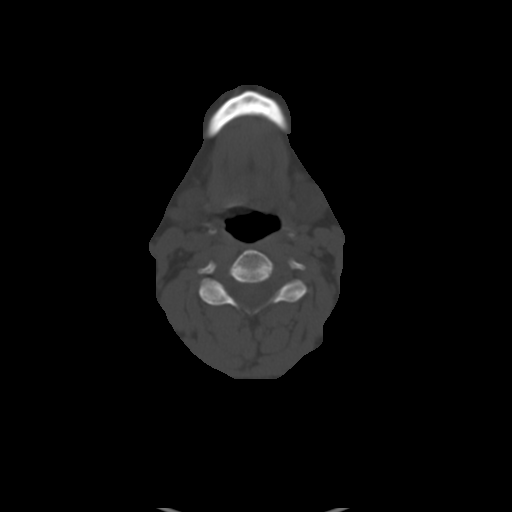
[im 58/97  bone]
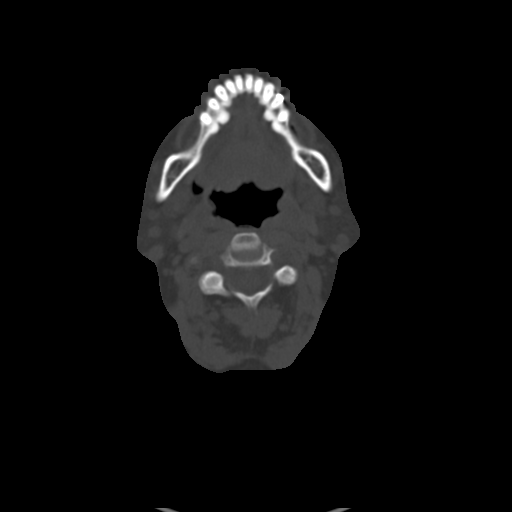
[im 68/97  bone]
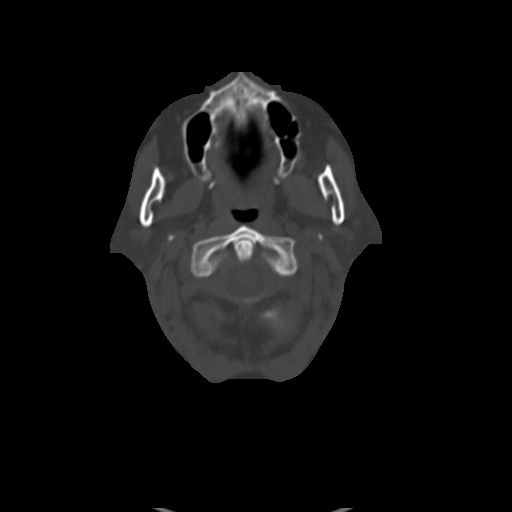
[im 77/97  bone]
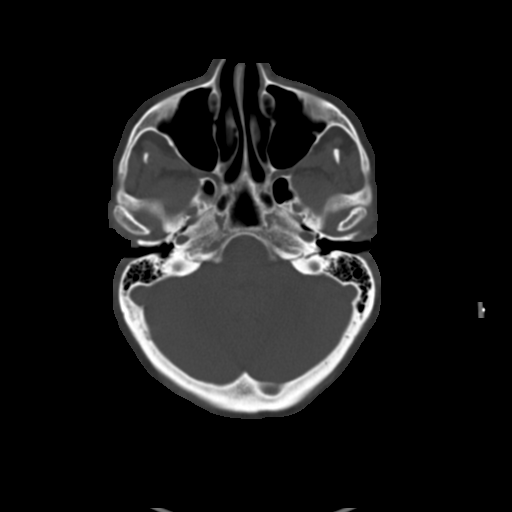
[im 87/97  soft-tissue]
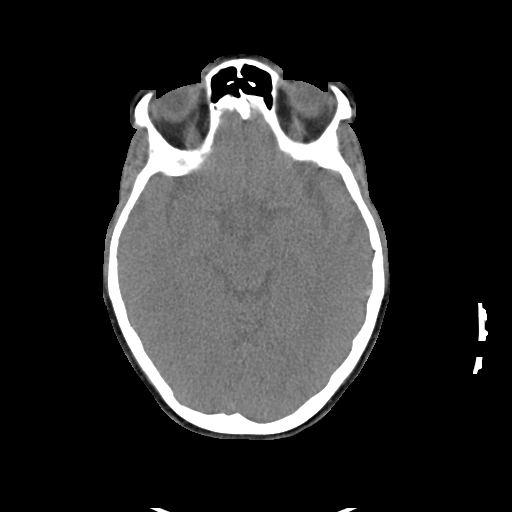
[im 87/97  bone]
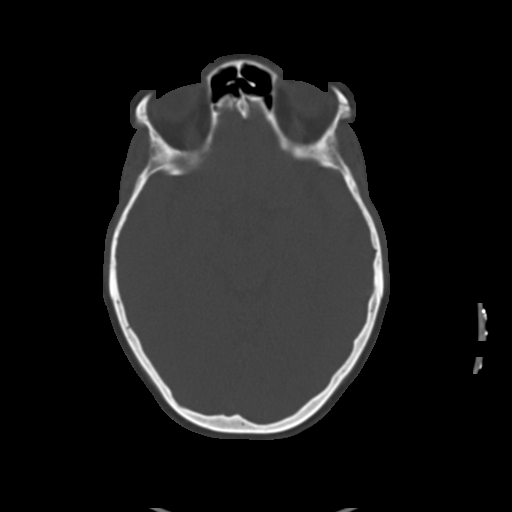

[9 of 14 positions shown; findings below may reference images not displayed]

FINDINGS: Evaluation is limited by lack of intravenous contrast. Evaluation of the
region of the larynx is limited by patient motion. No definite mass
identified. No lymphadenopathy seen in the neck.

There is mild scarring at the right lung apex. A 3 mm nodule is seen in the
superior aspect of the right lower lobe, image 90.

No aggressive lytic or sclerotic osseous lesions identified.
IMPRESSION: 1. Evaluation limited by lack of intravenous contrast and patient motion in
the region of the larynx. No definite mass notified. If there is continued
clinical concern, recommend direct visualization.
2. Indeterminate 3 mm nodule in the right lower lobe. A followup noncontrast
chest CT is recommended in the one year.

## 2012-11-10 IMAGING — CR DG CHEST 1V PORT
1 series · 1 of 1 positions shown · non-contrast
Comparison: none

REASON FOR EXAM: central line placement; Et tube placement
COMMENTS:

[view not recorded]
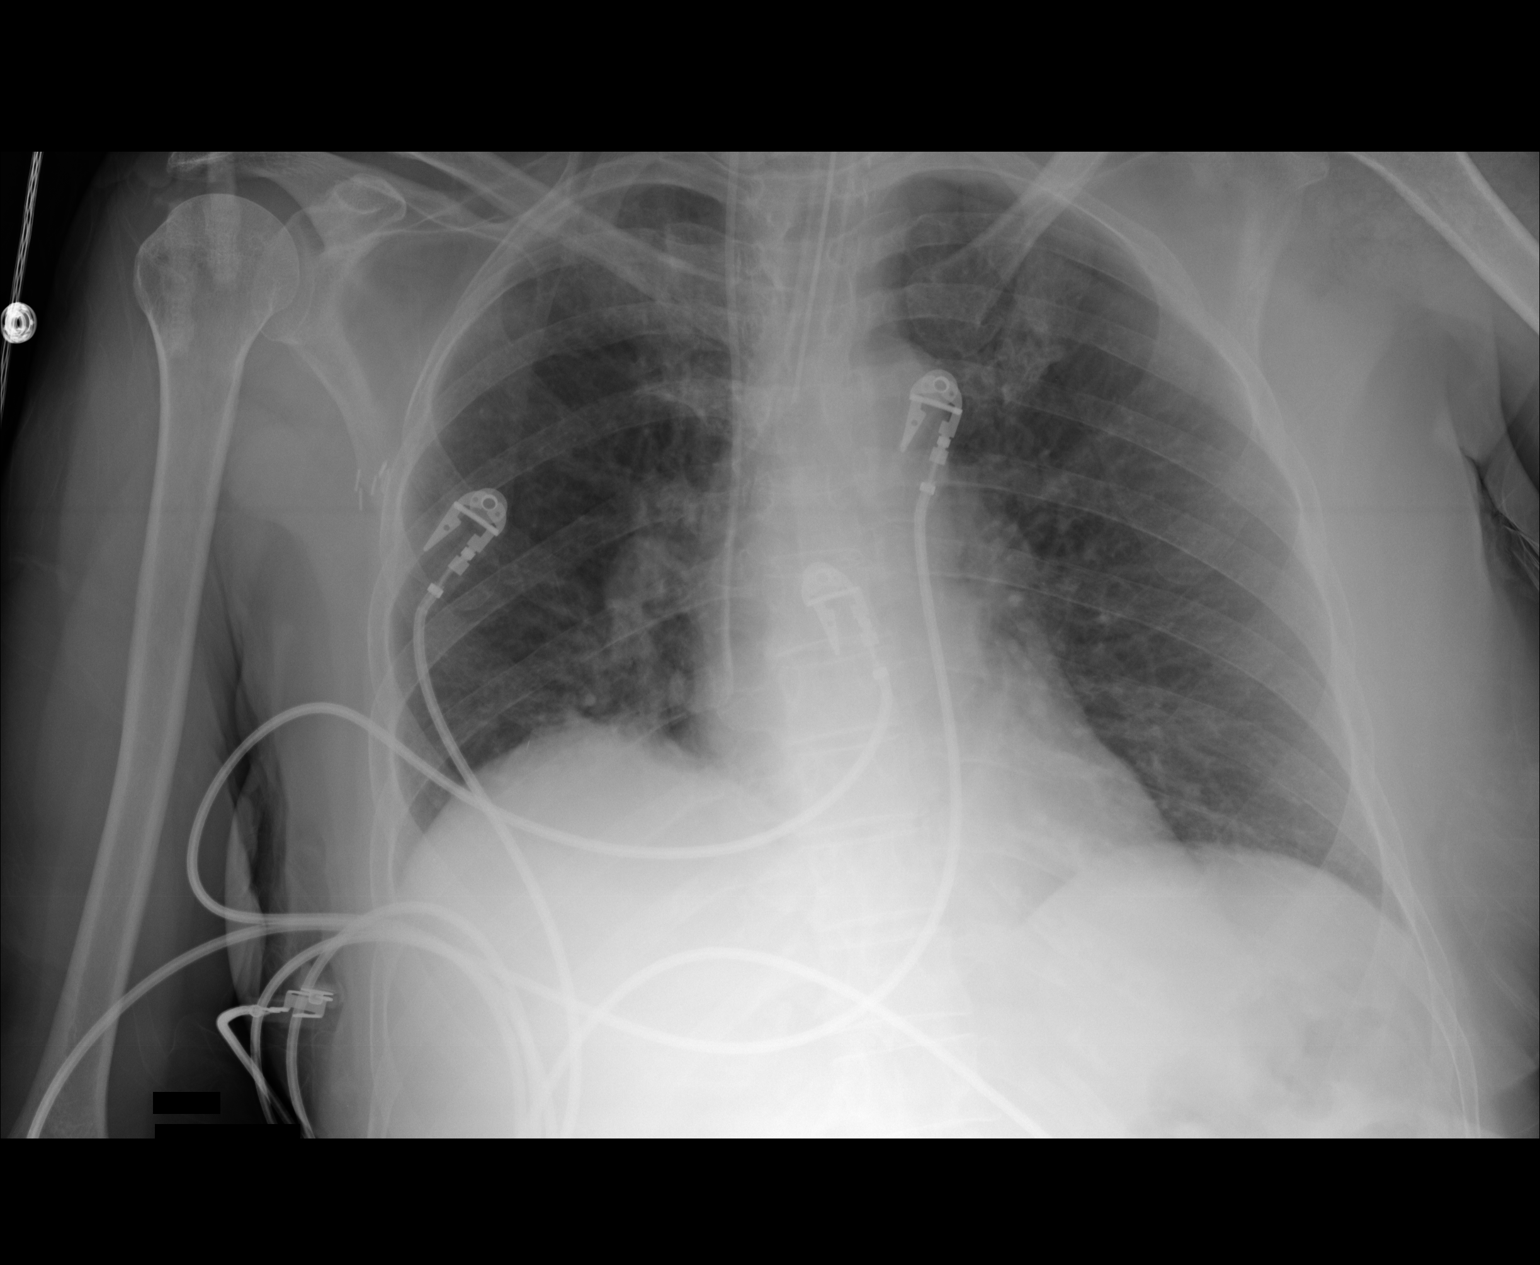

[1 of 1 positions shown; findings below may reference images not displayed]

PROCEDURE:     DXR - DXR PORTABLE CHEST SINGLE VIEW  - October 18, 2010  [DATE]

RESULT:     Comparison is made to a prior exam of 10/16/2010.

An endotracheal tube is present with the tip approximately 1.7 cm above the
carina. A venous catheter is present on the right with the tip projected
over the right atrium. No pneumothorax or pleural effusion is seen. There is
a skin fold visualized at the right apex. The heart size is normal.
Monitoring electrodes are present.
IMPRESSION: Please see above.

## 2012-11-11 IMAGING — CR DG CHEST 1V PORT
1 series · 1 of 1 positions shown · non-contrast
Comparison: none

REASON FOR EXAM: post-op trach
COMMENTS:

PROCEDURE:     DXR - DXR PORTABLE CHEST SINGLE VIEW  - October 19, 2010  [DATE]
RESULT:     Comparison: 10/18/2010

[view not recorded]
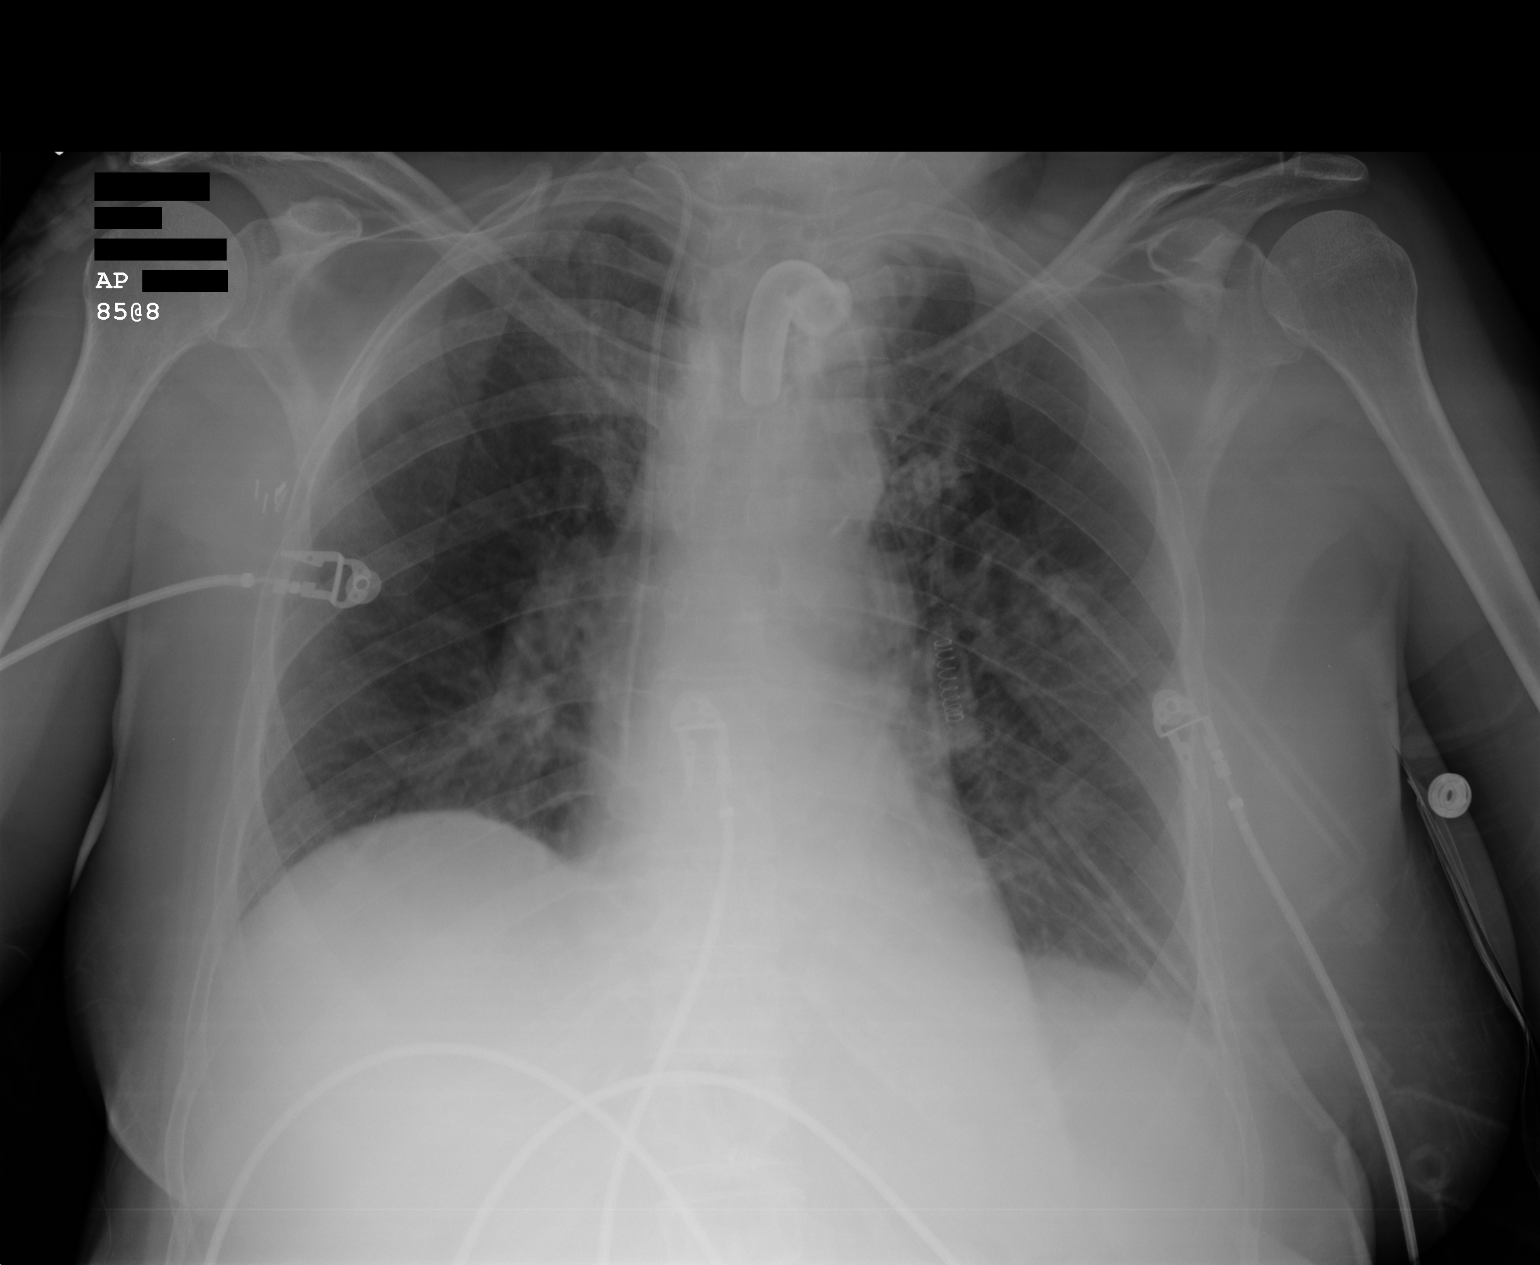

[1 of 1 positions shown; findings below may reference images not displayed]

FINDINGS: The endotracheal tube has been removed. A tracheostomy is in place. Right IJ
central venous catheter tip overlies the right atrium. Heart and mediastinum
are stable. There is mild prominence of the pulmonary interstitium, similar
to prior.
IMPRESSION: Tracheostomy tube tip overlies the superior intrathoracic trachea.

## 2012-11-13 ENCOUNTER — Ambulatory Visit: Payer: Self-pay | Admitting: General Surgery

## 2012-11-13 IMAGING — CR DG CHEST 1V PORT
1 series · 1 of 1 positions shown · non-contrast
Comparison: none

REASON FOR EXAM: fever, on vent
COMMENTS:

[view not recorded]
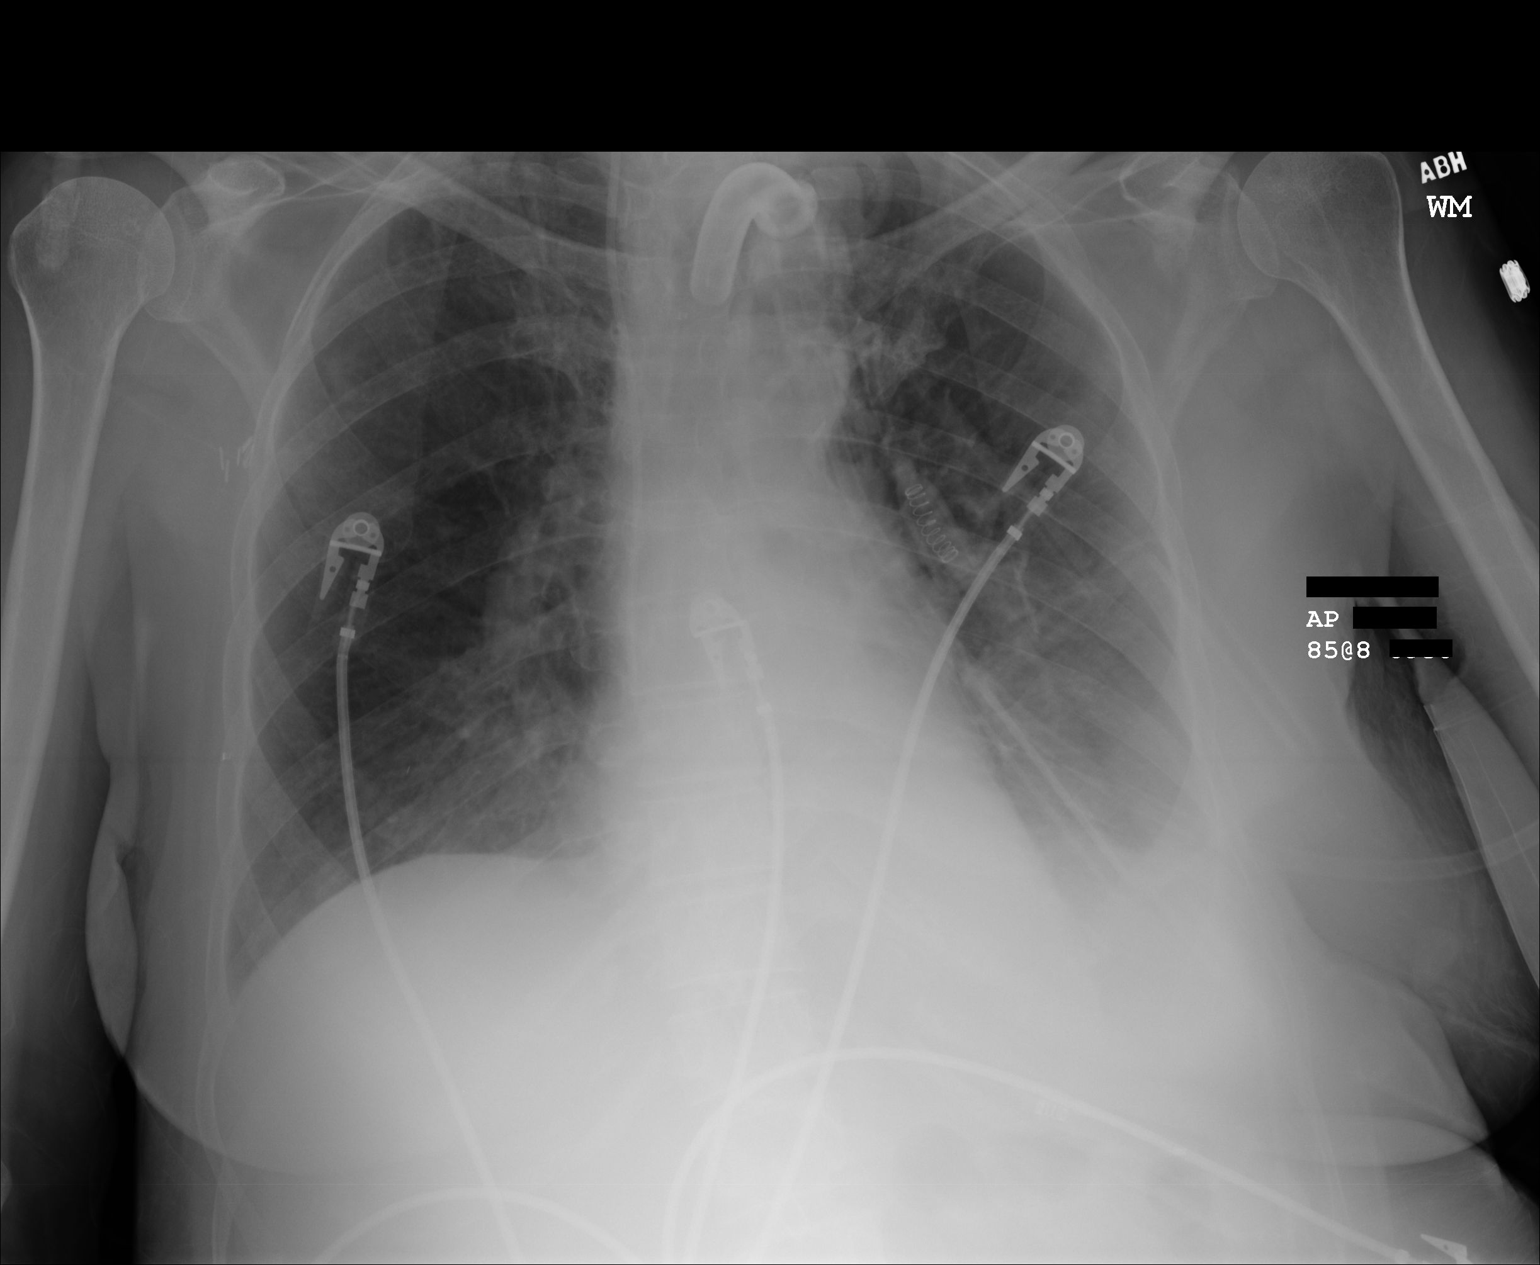

[1 of 1 positions shown; findings below may reference images not displayed]

PROCEDURE:     DXR - DXR PORTABLE CHEST SINGLE VIEW  - October 21, 2010  [DATE]

RESULT:     Comparison is made to the study 19 October, 2010.

There is a tracheostomy appliance in place with the tip at the level of the
clavicular heads. There is a right internal jugular venous catheter whose
tip lies in the region of the distal SVC. The lungs are adequately inflated.
There is new increased density in the retrocardiac region and in the left
costophrenic angle which suggest atelectasis and/or pleural fluid. The right
lung is grossly clear. The cardiac silhouette is not enlarged.
IMPRESSION: Since the study [DATE] there has developed increased
density at the left lung base consistent with atelectasis and/or pleural
fluid.

## 2012-11-14 IMAGING — CR DG CHEST 1V PORT
1 series · 1 of 1 positions shown · non-contrast
Comparison: none

REASON FOR EXAM: on vent fever, trach
COMMENTS:

[view not recorded]
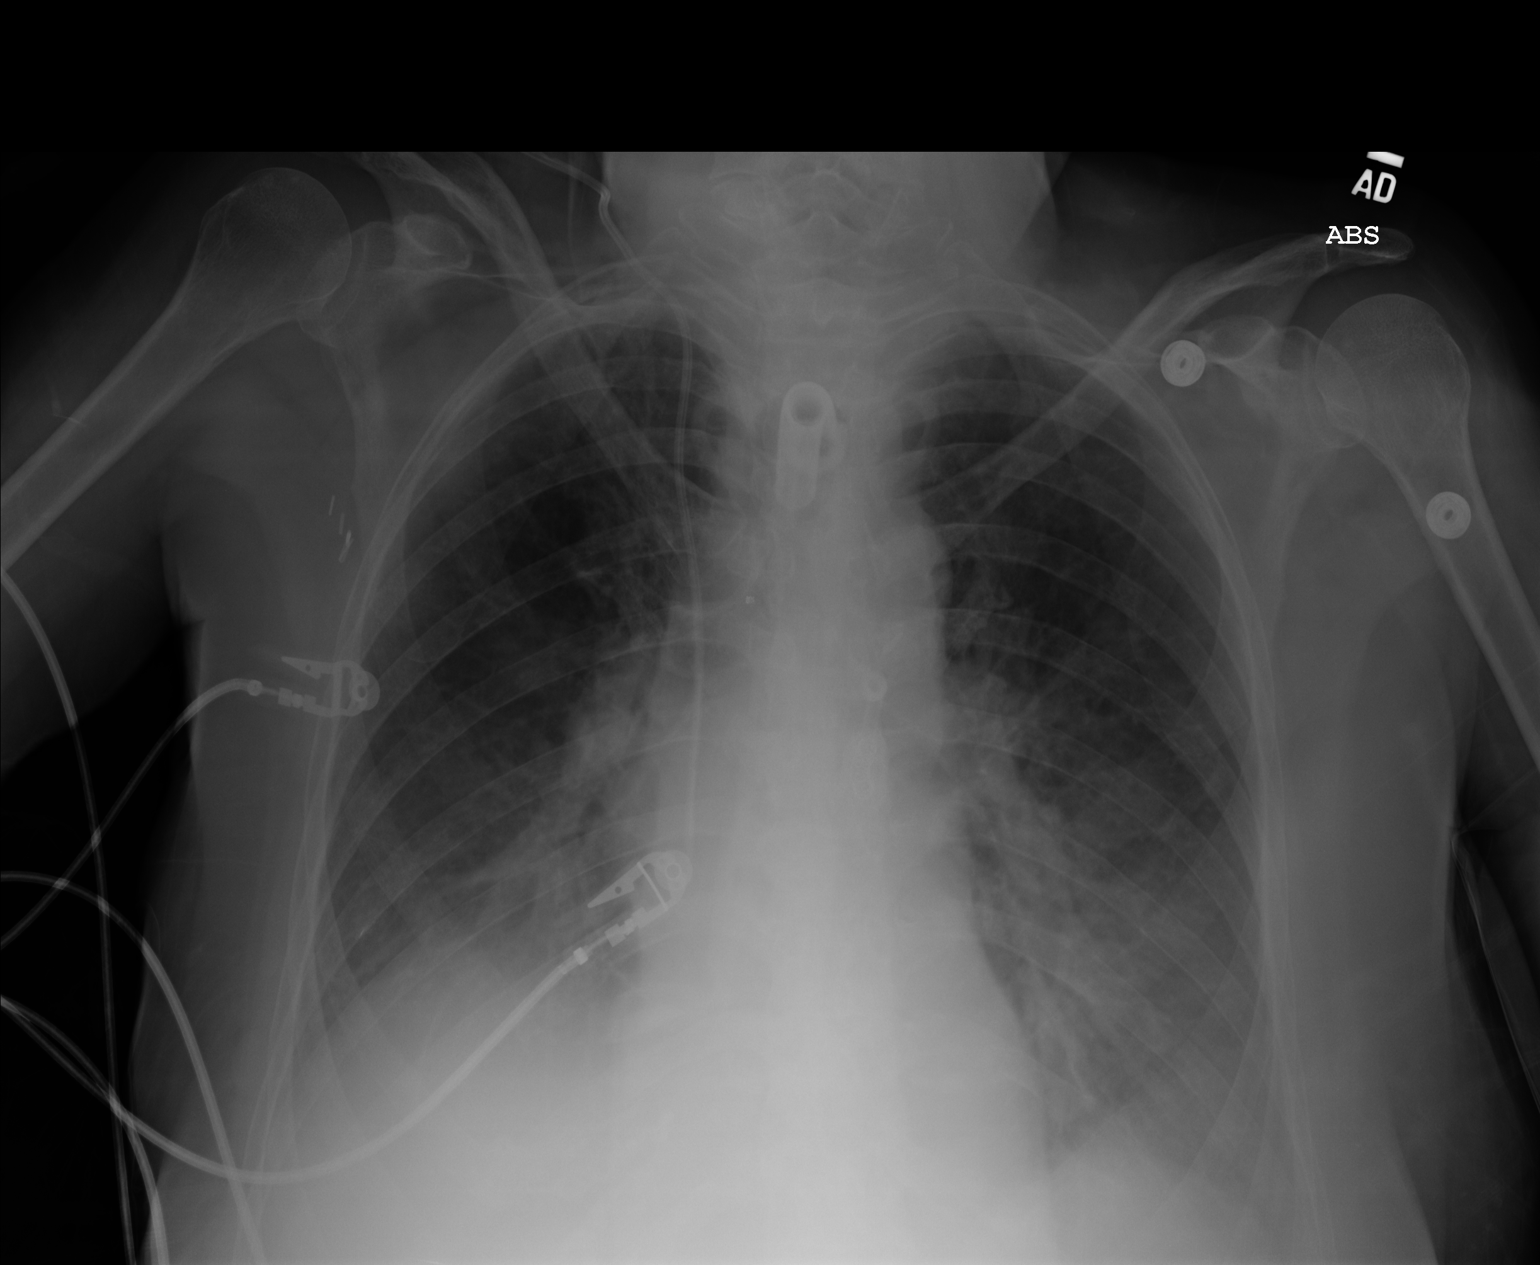

[1 of 1 positions shown; findings below may reference images not displayed]

PROCEDURE:     DXR - DXR PORTABLE CHEST SINGLE VIEW  - October 22, 2010  [DATE]

RESULT:     Comparison is made to the study 21 October, 2010.

The tracheostomy appliance appears unchanged as does the right internal
jugular venous catheter. There remain increased densities in the perihilar
regions and inferiorly. The hemidiaphragms are less well seen on today's
study. The cardiac silhouette is not enlarged.
IMPRESSION: There are persistent interstitial and alveolar densities
inferiorly with further obscuration of the right hemidiaphragm today. The
left hemidiaphragm remains partially obscured. The increased density at the
right lung base may reflect progressive atelectasis or pneumonia.

## 2012-11-16 IMAGING — CR DG CHEST 1V PORT
1 series · 1 of 1 positions shown · non-contrast
Comparison: none

REASON FOR EXAM: f/u resp failure
COMMENTS:

[view not recorded]
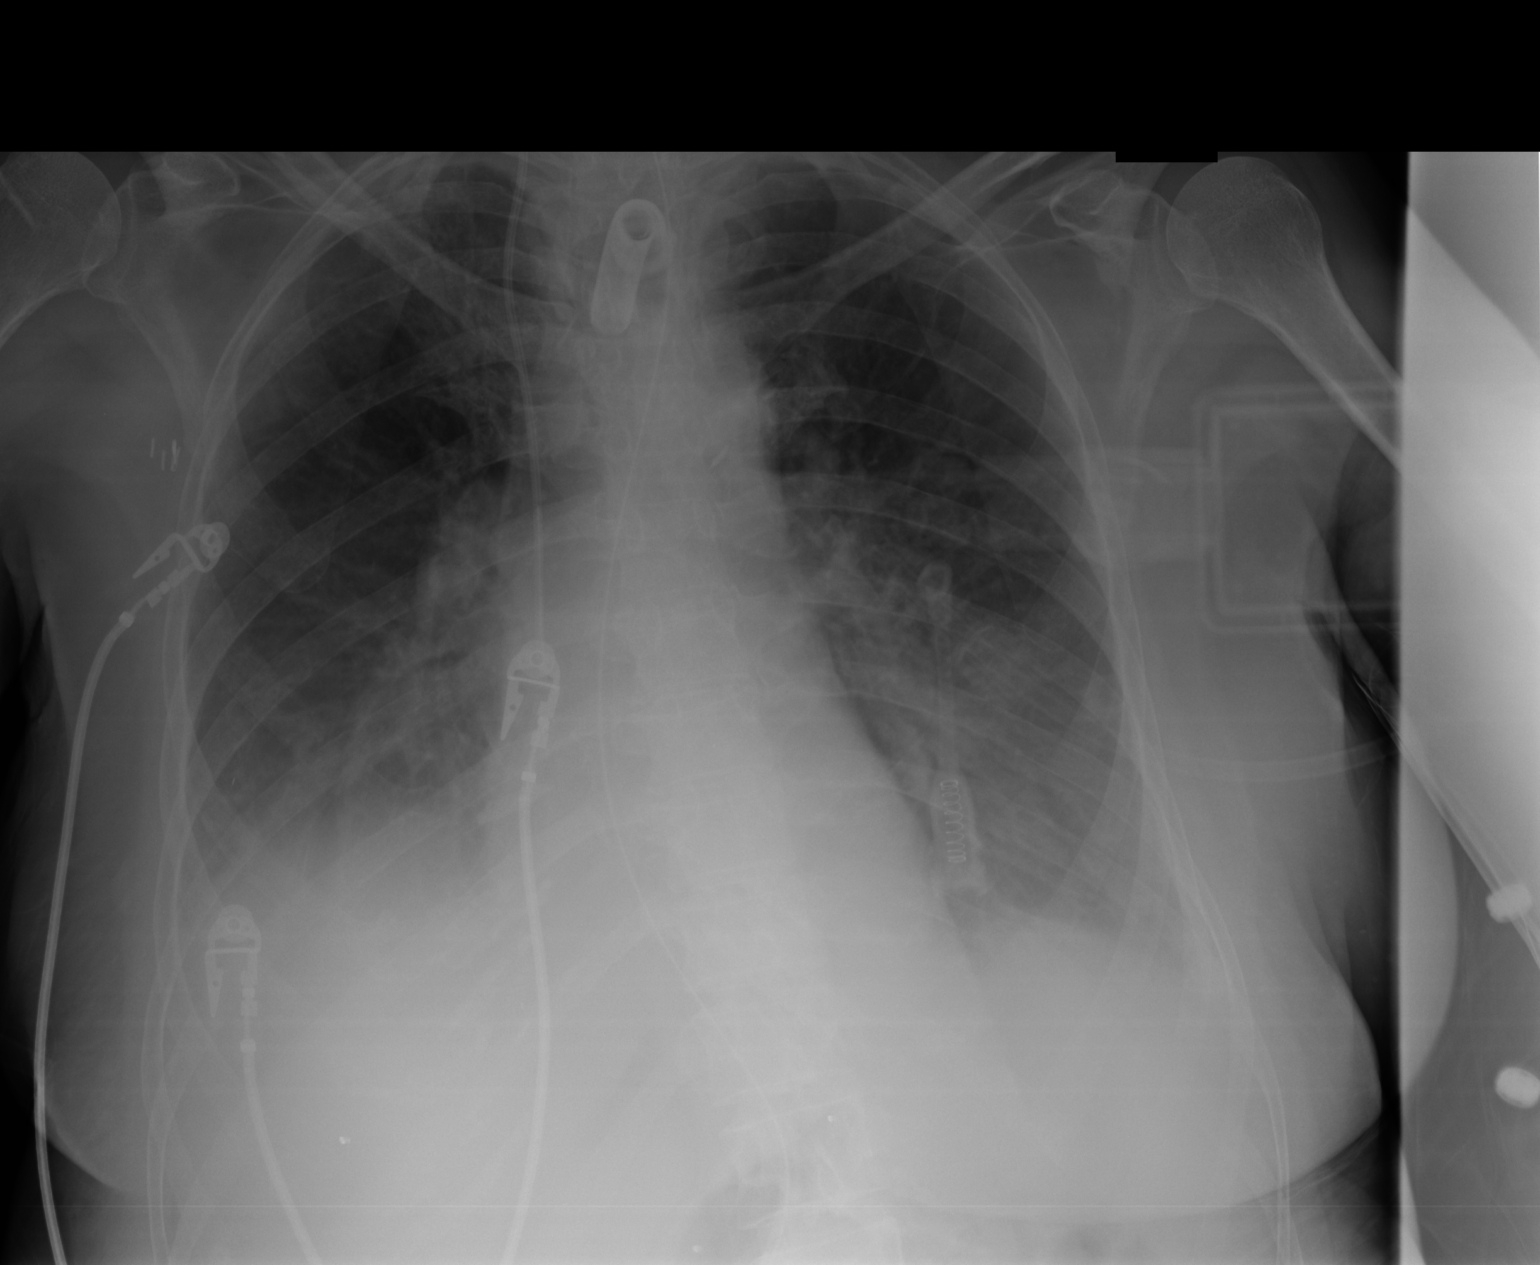

[1 of 1 positions shown; findings below may reference images not displayed]

PROCEDURE:     DXR - DXR PORTABLE CHEST SINGLE VIEW  - October 24, 2010  [DATE]

RESULT:     Comparison is made to the study 22 October, 2010.

The cardiac silhouette is normal in size. The hemidiaphragms are partially
obscured. There are coarse perihilar lung markings which are fairly stable.
There is a tracheostomy appliance in place. There is a right internal
jugular venous catheter in place. There is an esophagogastric tube in place.
IMPRESSION: There has not been dramatic interval change in the
appearance of the chest since yesterday's study. There remain increased
densities in the mid and lower lung likely posteriorly consistent with
atelectasis or pneumonia. Pleural fluid collections may be present at the
lung bases.

## 2012-11-17 ENCOUNTER — Encounter: Payer: Self-pay | Admitting: General Surgery

## 2012-11-17 IMAGING — CT CT HEAD WITHOUT CONTRAST
3 of 4 series · 17 of 30 positions shown, 19 images · non-contrast
Comparison: none

REASON FOR EXAM: vocal cord paralysis, AMS
COMMENTS:

[Series 2: without · axial · non-contrast · 0.42mm/px · z∈[-104,+6]mm · 6 of 32 slices shown]
[im 5/32  brain]
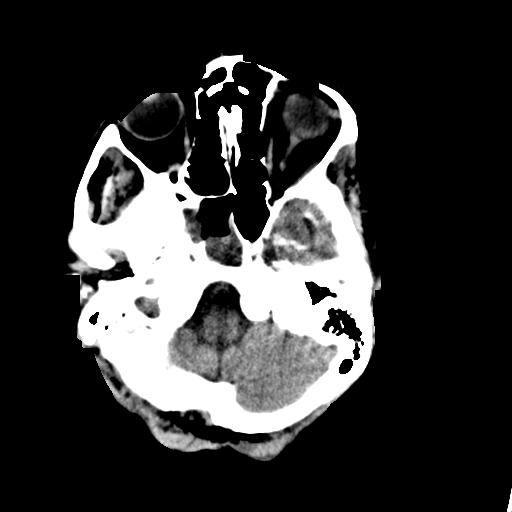
[im 9/32  brain]
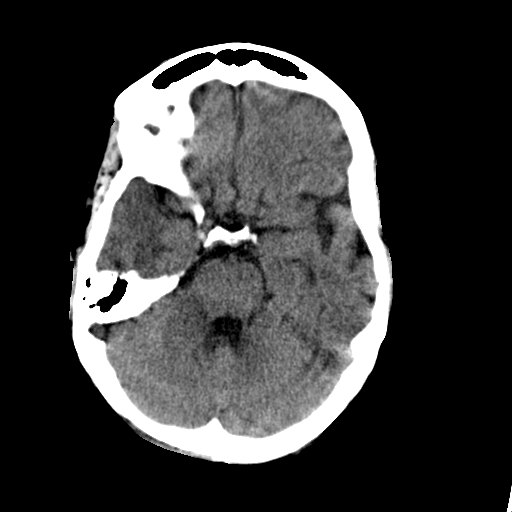
[im 14/32  brain]
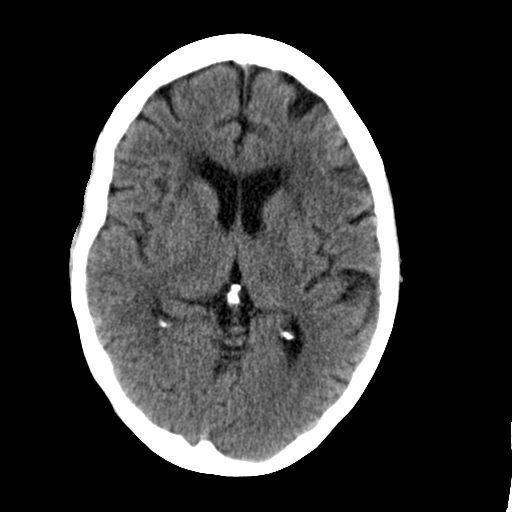
[im 18/32  brain]
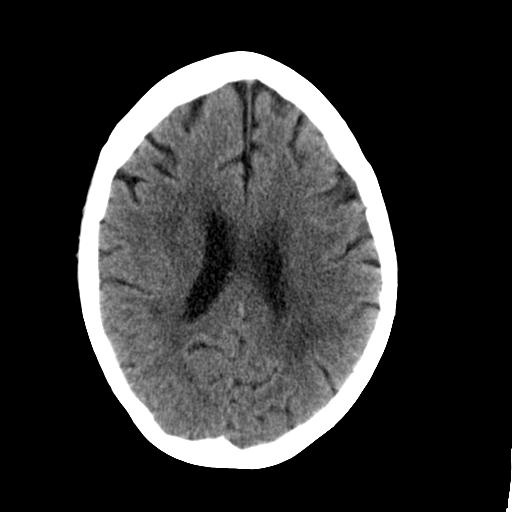
[im 23/32  brain]
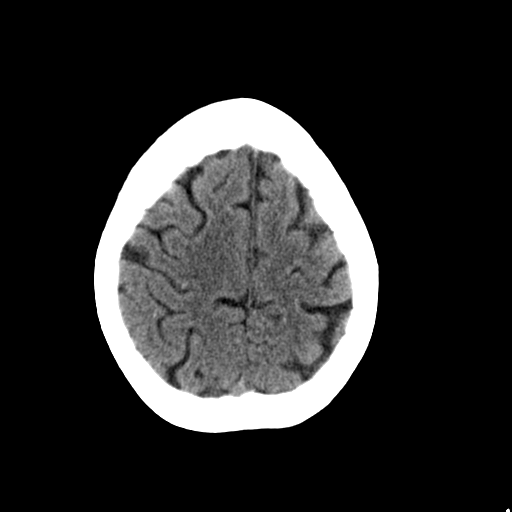
[im 27/32  brain]
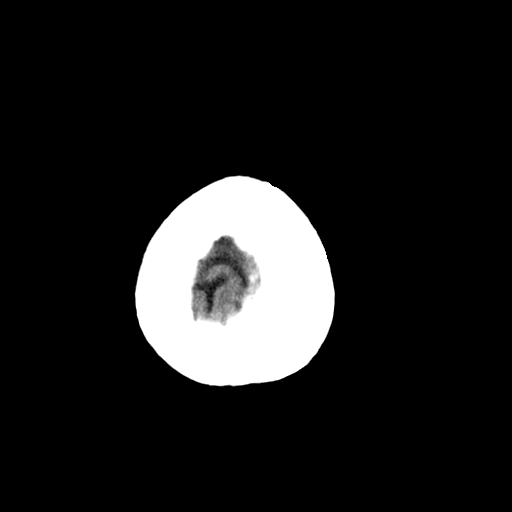

[Series 4: without 2 · axial · non-contrast · 0.42mm/px · z∈[-98,+2]mm · 5 of 32 slices shown, 7 images]
[im 6/32  brain]
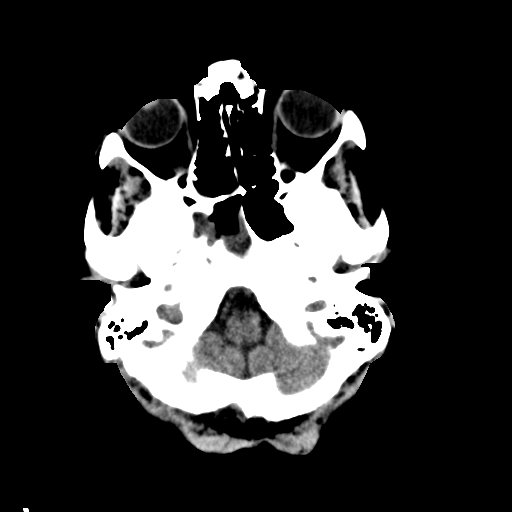
[im 6/32  bone]
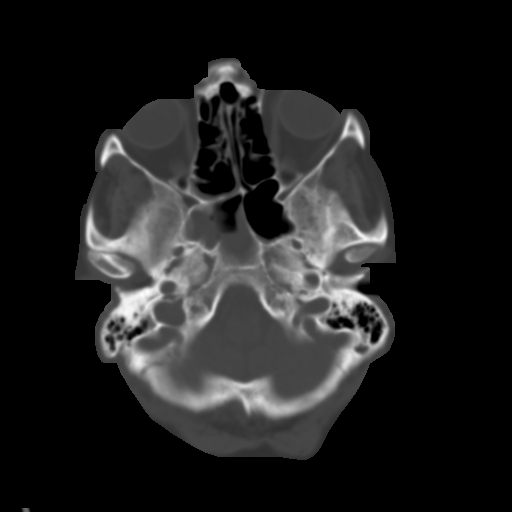
[im 11/32  brain]
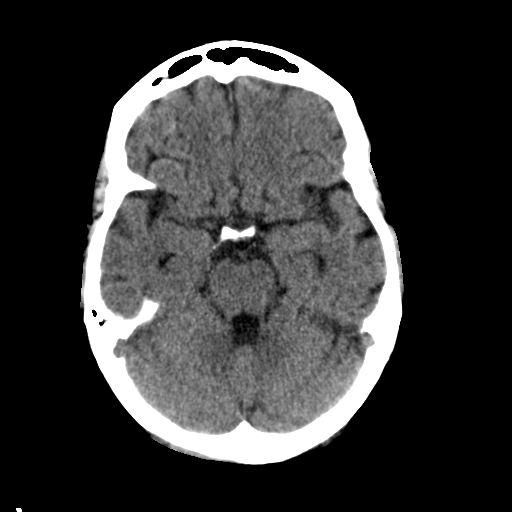
[im 16/32  brain]
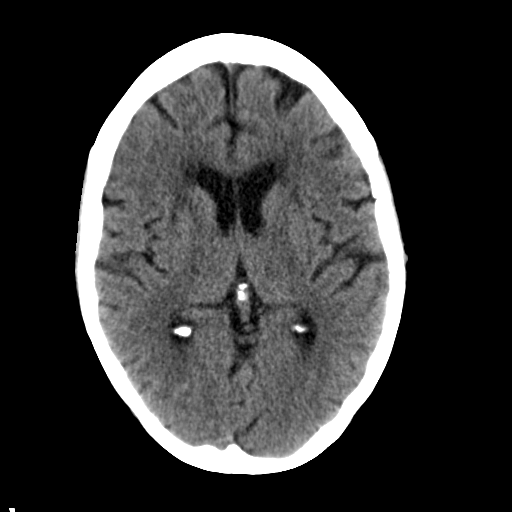
[im 21/32  brain]
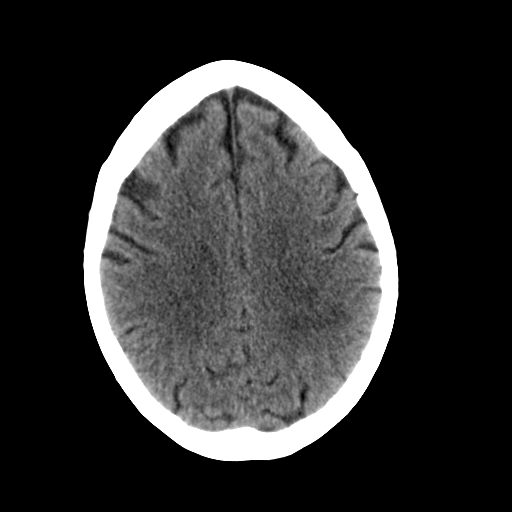
[im 26/32  brain]
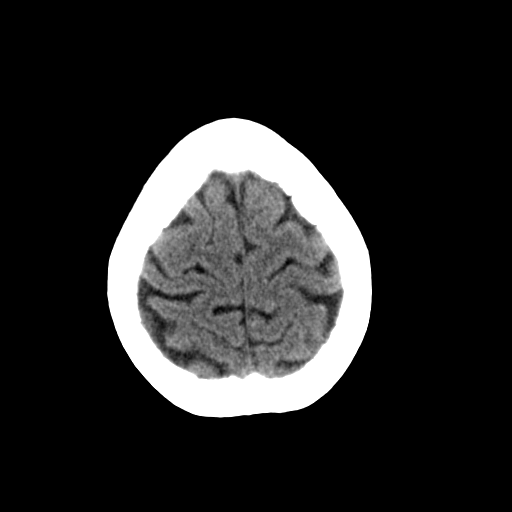
[im 26/32  bone]
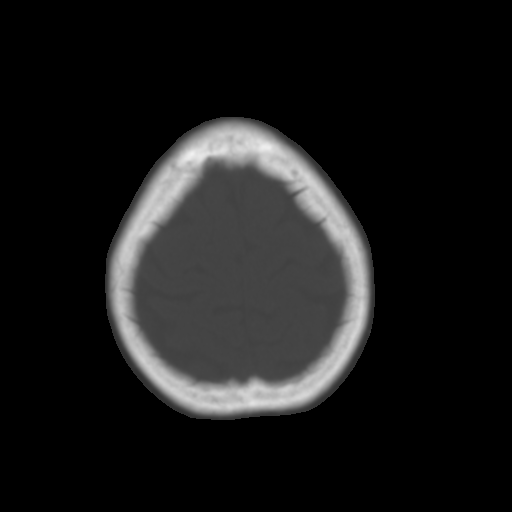

[Series 5: bone 2 · axial · 0.42mm/px · z∈[-104,+15]mm · 6 of 34 slices shown]
[im 5/34  bone]
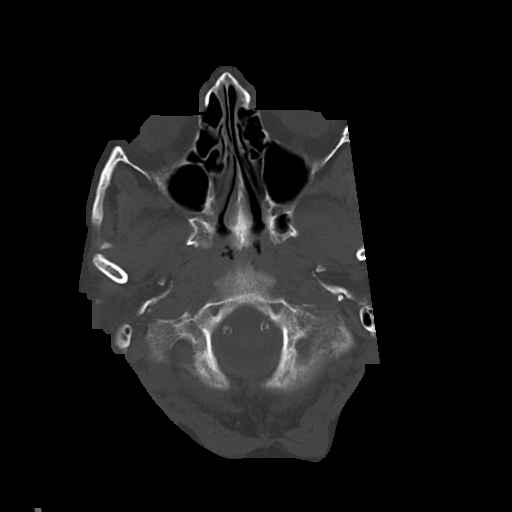
[im 10/34  bone]
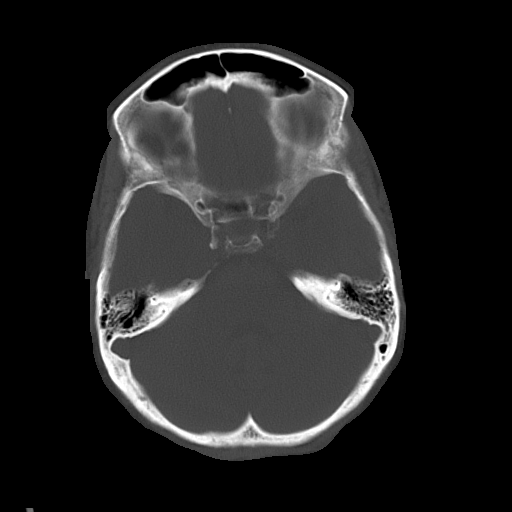
[im 15/34  bone]
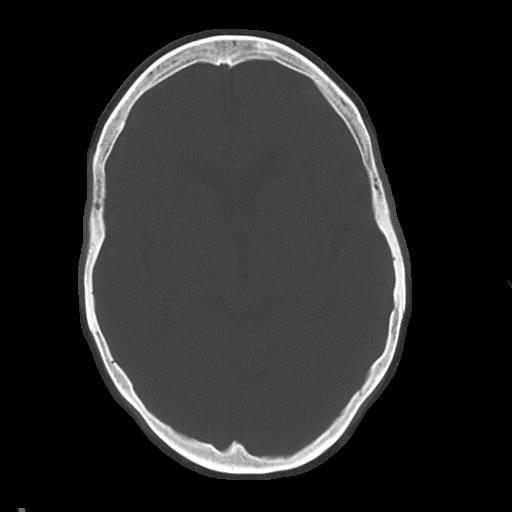
[im 19/34  bone]
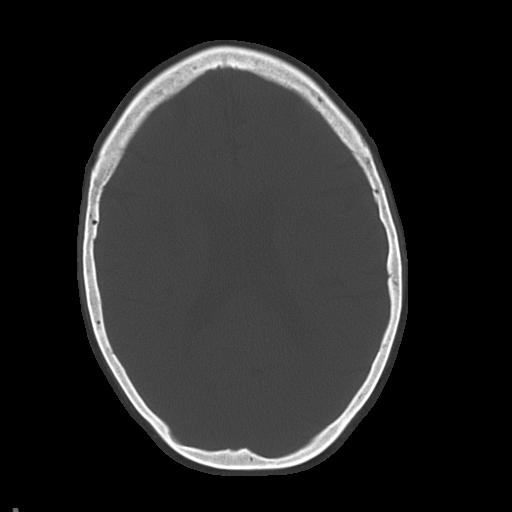
[im 24/34  bone]
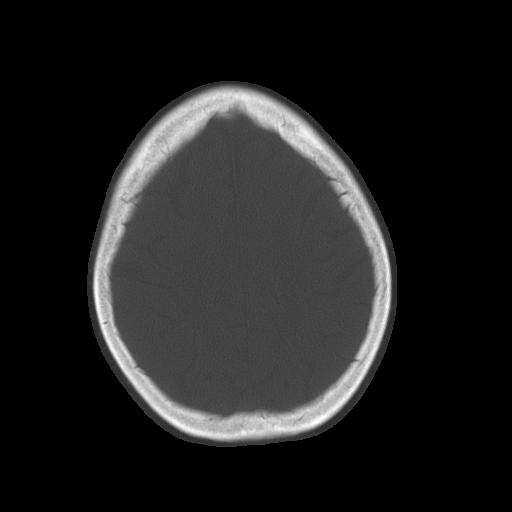
[im 29/34  bone]
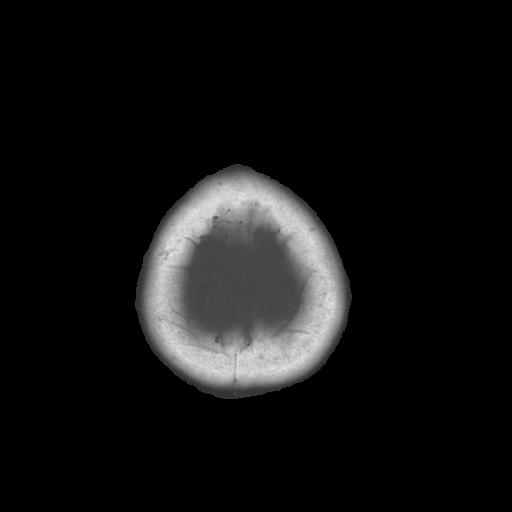

[17 of 30 positions shown; findings below may reference images not displayed]

PROCEDURE:     CT  - CT HEAD WITHOUT CONTRAST  - October 25, 2010  [DATE]

RESULT:     Axial noncontrast CT scanning was performed through the brain at
5 mm intervals and slice thicknesses.

The ventricles are normal in size and position. There is no intracranial
hemorrhage nor intracranial mass effect. There is decreased density in the
deep white matter of both cerebral hemispheres consistent with chronic small
vessel ischemic type change. I see no evidence of an evolving ischemic
infarction. No abnormal intracranial calcifications are demonstrated. The
cerebellum and brainstem are normal in density.

At bone window settings there is considerable soft tissue density material
and likely fluid within the right sphenoid sinus cells. The ethmoid sinuses
are minimally involved posteriorly on the right. The frontal and visualized
portions of the maxillary sinuses are clear. The mastoid air cells are well
pneumatized. There is no evidence of an acute skull fracture.
IMPRESSION: 1. There is decreased density in the deep white matter of both cerebral
hemispheres consistent with chronic small vessel ischemic type change. I see
no acute ischemic or hemorrhagic infarction.
2. There is no hydrocephalus nor intracranial mass effect.
3. There is layering fluid and mucoperiosteal thickening involving the right
sphenoid sinus cells as well as the posterior aspect of a right ethmoid
sinus cell.

## 2012-11-18 ENCOUNTER — Ambulatory Visit: Payer: Self-pay | Admitting: General Surgery

## 2012-11-18 IMAGING — CR DG CHEST 1V PORT
1 series · 1 of 1 positions shown · non-contrast
Comparison: none

REASON FOR EXAM: f/u resp failure
COMMENTS:

[view not recorded]
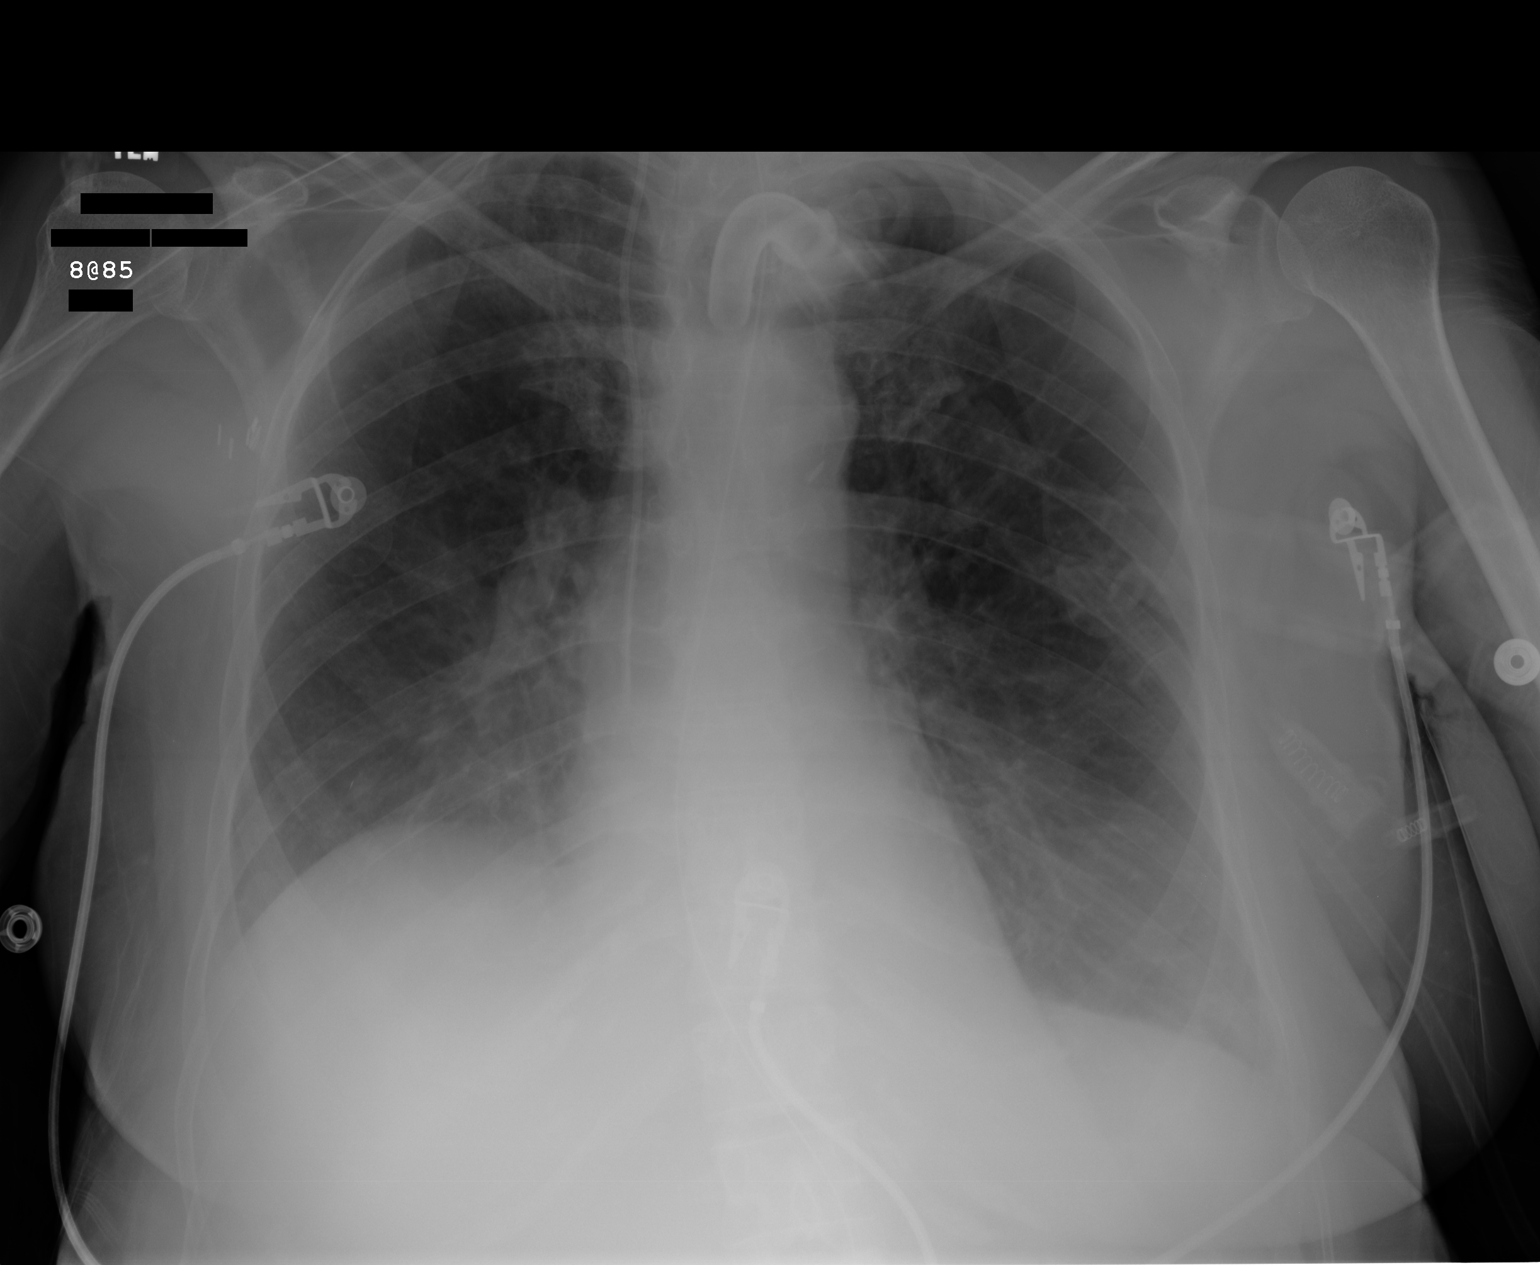

[1 of 1 positions shown; findings below may reference images not displayed]

PROCEDURE:     DXR - DXR PORTABLE CHEST SINGLE VIEW  - October 26, 2010  [DATE]

RESULT:     Comparison is made to the study 24 October, 2010.

The lungs are adequately inflated. The tracheostomy appliance tip lies at
the level of the clavicular heads. The right internal jugular venous
catheter tip lies in the region of the distal SVC. The perihilar lung
markings are less prominent. The hemidiaphragms are better demonstrated. The
cardiac silhouette is not enlarged.
IMPRESSION: There has been some improvement in the appearance of the
pulmonary interstitium since the previous study. A followup PA and lateral
chest x-ray would be of value when the patient can tolerate the procedure.

## 2012-11-24 IMAGING — CR DG CHEST 1V PORT
1 series · 1 of 1 positions shown · non-contrast
Comparison: None.

CLINICAL DATA: Sudden onset of shortness of breath.  Tracheostomy.

PORTABLE CHEST - 1 VIEW

[AP]
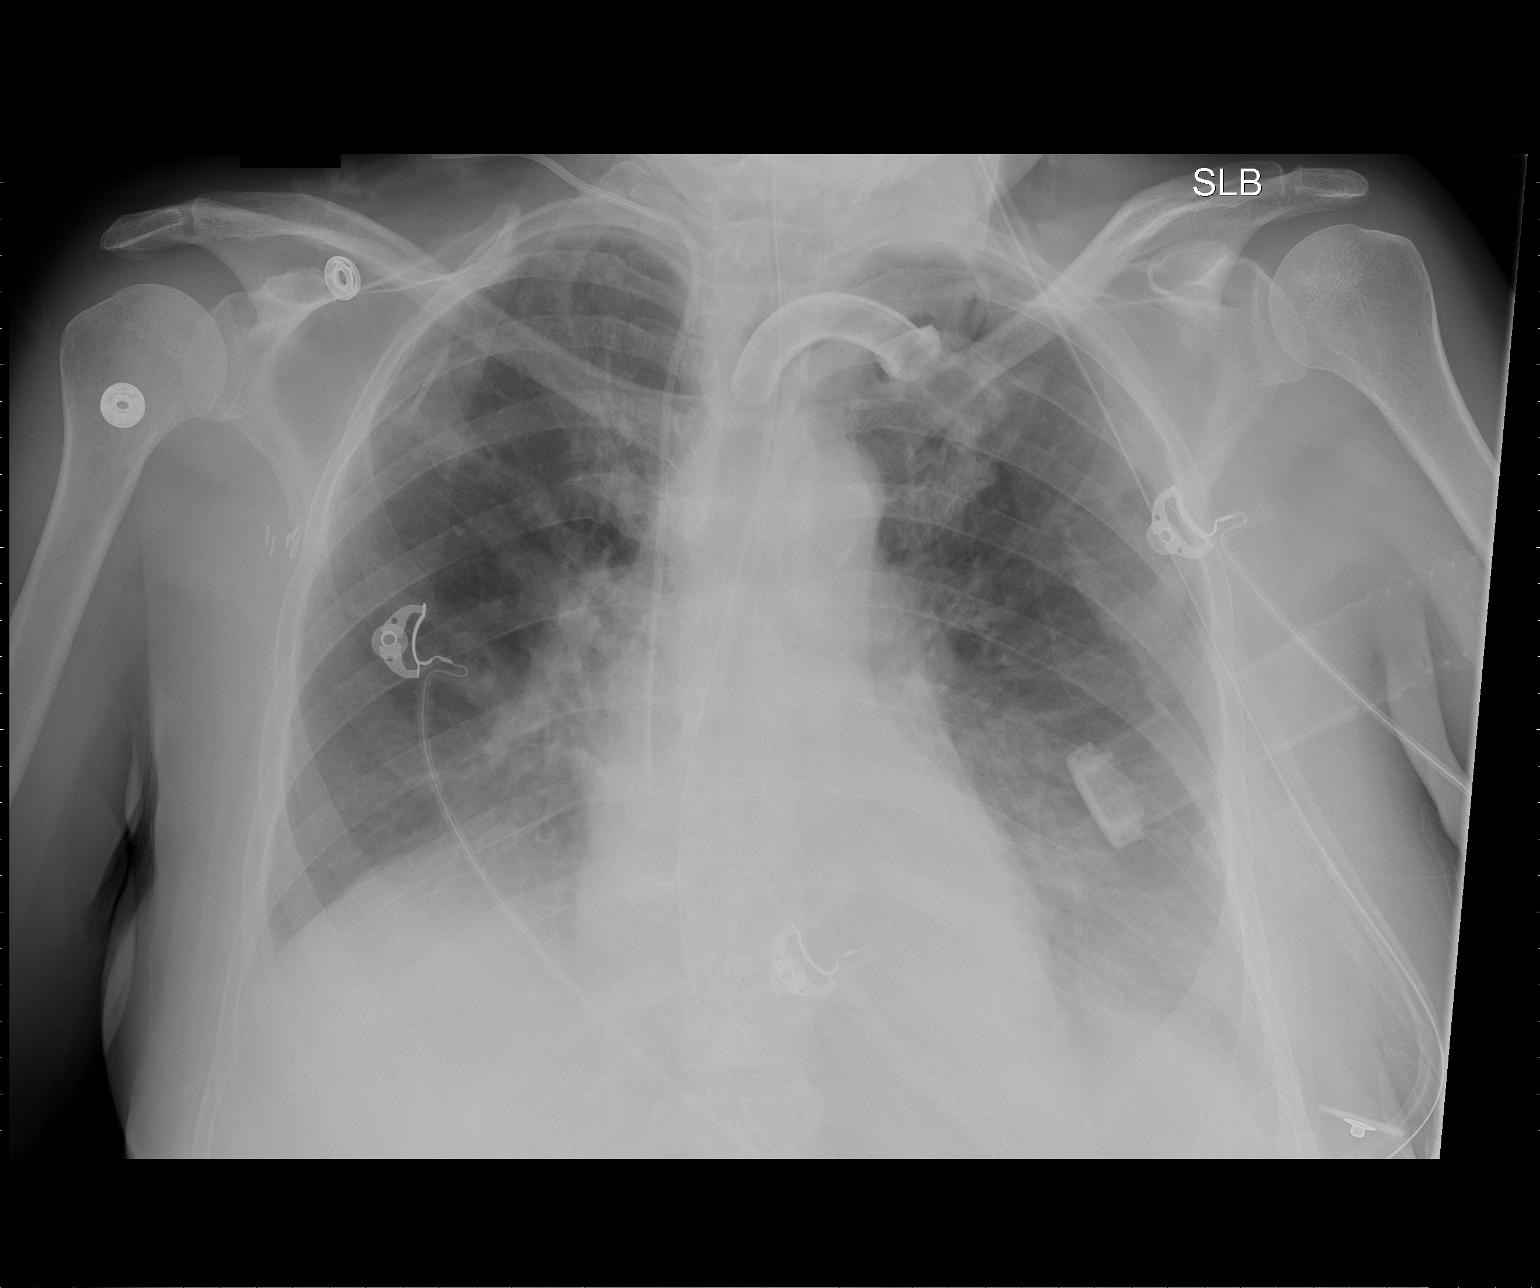

[1 of 1 positions shown; findings below may reference images not displayed]

FINDINGS: 2615 hours.  Tracheostomy appears well positioned within
the mid trachea.  A right IJ central venous catheter projects to
the SVC right atrial junction.  A nasogastric tube projects below
the diaphragm.  The heart size and mediastinal contours are normal.
There are bibasilar pulmonary opacities which probably reflect a
combination of pleural effusions and basilar atelectasis.  Mild
pulmonary edema is likely.  There is no pneumothorax.
IMPRESSION: Probable bilateral pleural effusions, bibasilar atelectasis and
mild edema.  No pneumothorax.

## 2012-11-25 IMAGING — CR DG CHEST 1V PORT
1 series · 1 of 1 positions shown · non-contrast
Comparison: 11/01/2010 at 2746 hours

CLINICAL DATA: PICC line placement

PORTABLE CHEST - 1 VIEW

[AP]
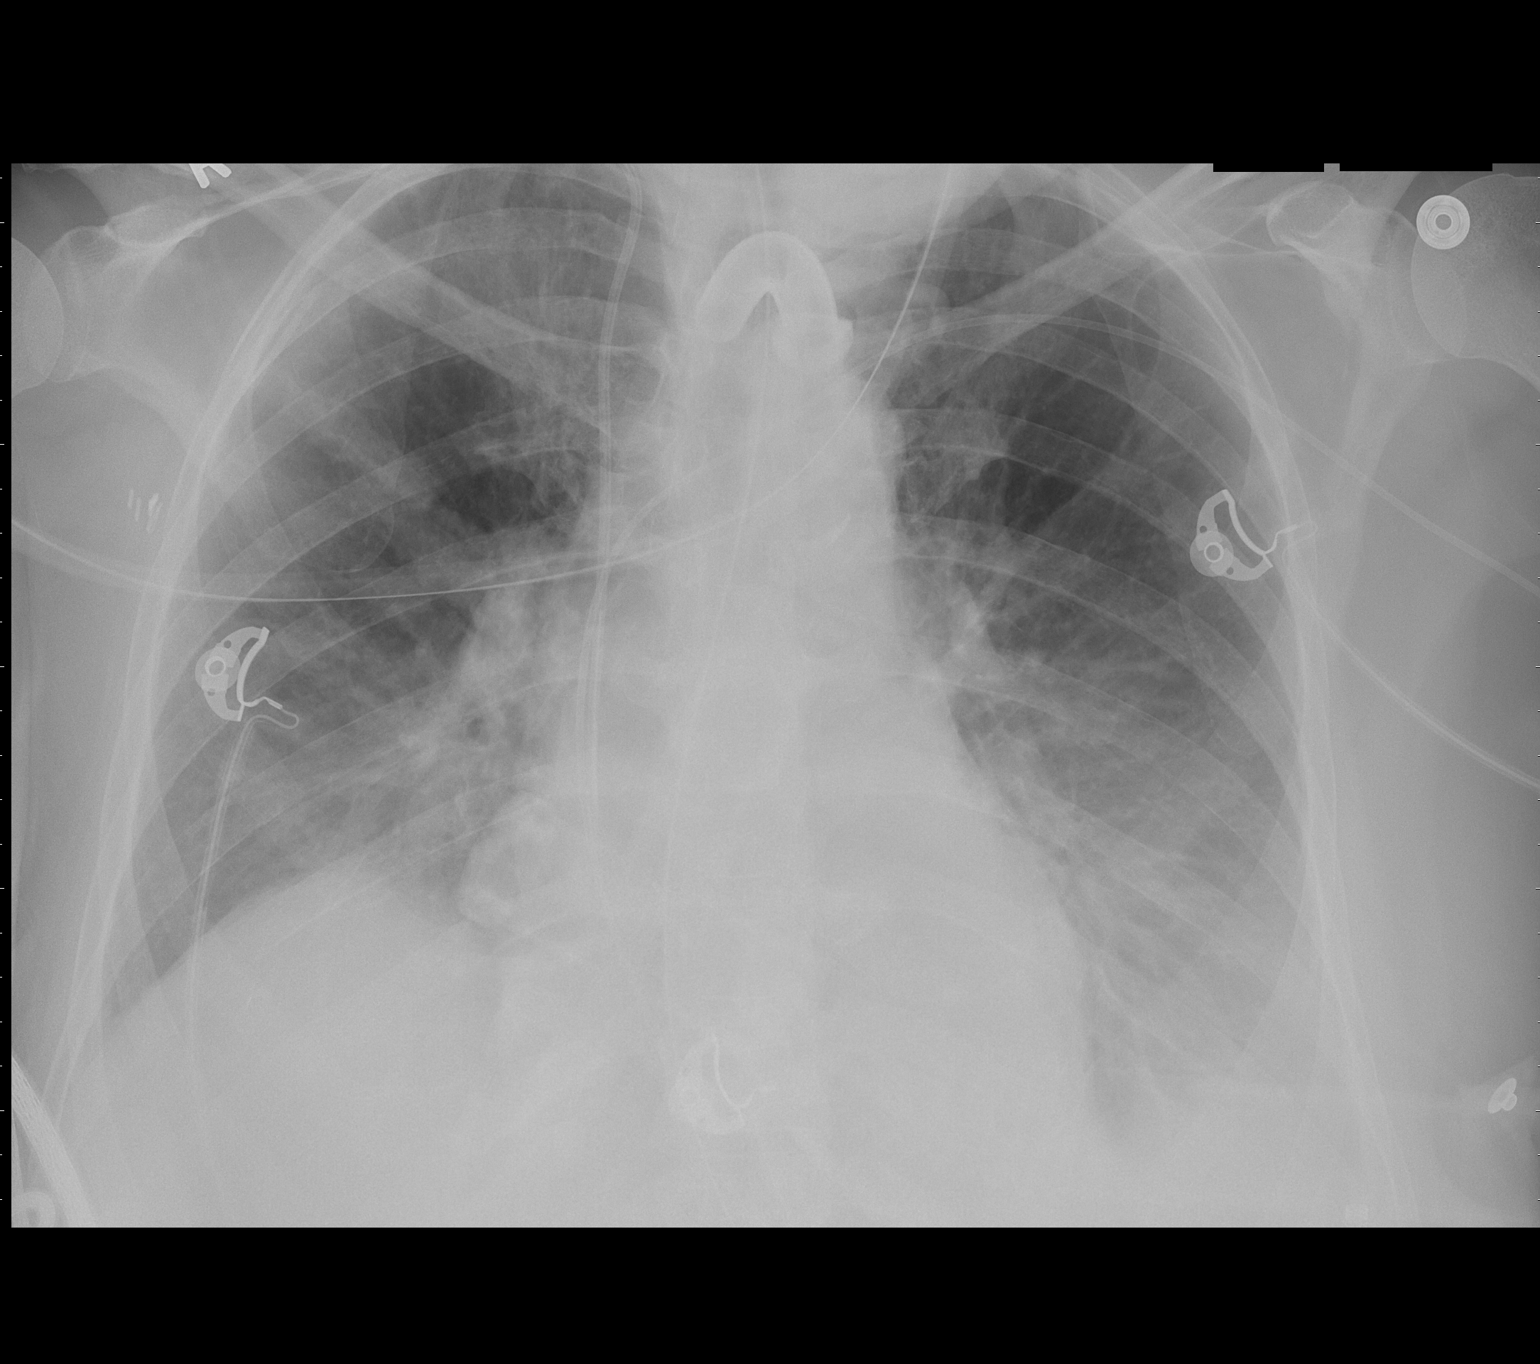

[1 of 1 positions shown; findings below may reference images not displayed]

FINDINGS: Interval placement of a left PICC catheter with tip
difficult to visualize but apparently in the mid right atrial
region.  If positioning in the SVC is desired, the catheter could
be retracted approximately 5 cm.  Enteric tube remains unchanged in
position with tip below the left hemidiaphragm although not
specifically visualized.  Tracheostomy tube in place.  Normal heart
size.  Left pleural effusion with basilar atelectasis or
infiltration.  No pneumothorax.  Surgical clips in the right
axilla.  Right central venous catheter remains unchanged in
position with tip also in the lower SVC region.
IMPRESSION: The left PICC line has been placed with tip apparently over the
right atrium about 5 cm below the RA/SVC junction.  Examination is
otherwise stable.

## 2012-11-25 IMAGING — CR DG ABD PORTABLE 1V
1 series · 1 of 1 positions shown · non-contrast
Comparison: None.

CLINICAL DATA: Panda tube placement.

ABDOMEN - 1 VIEW

[view not recorded]
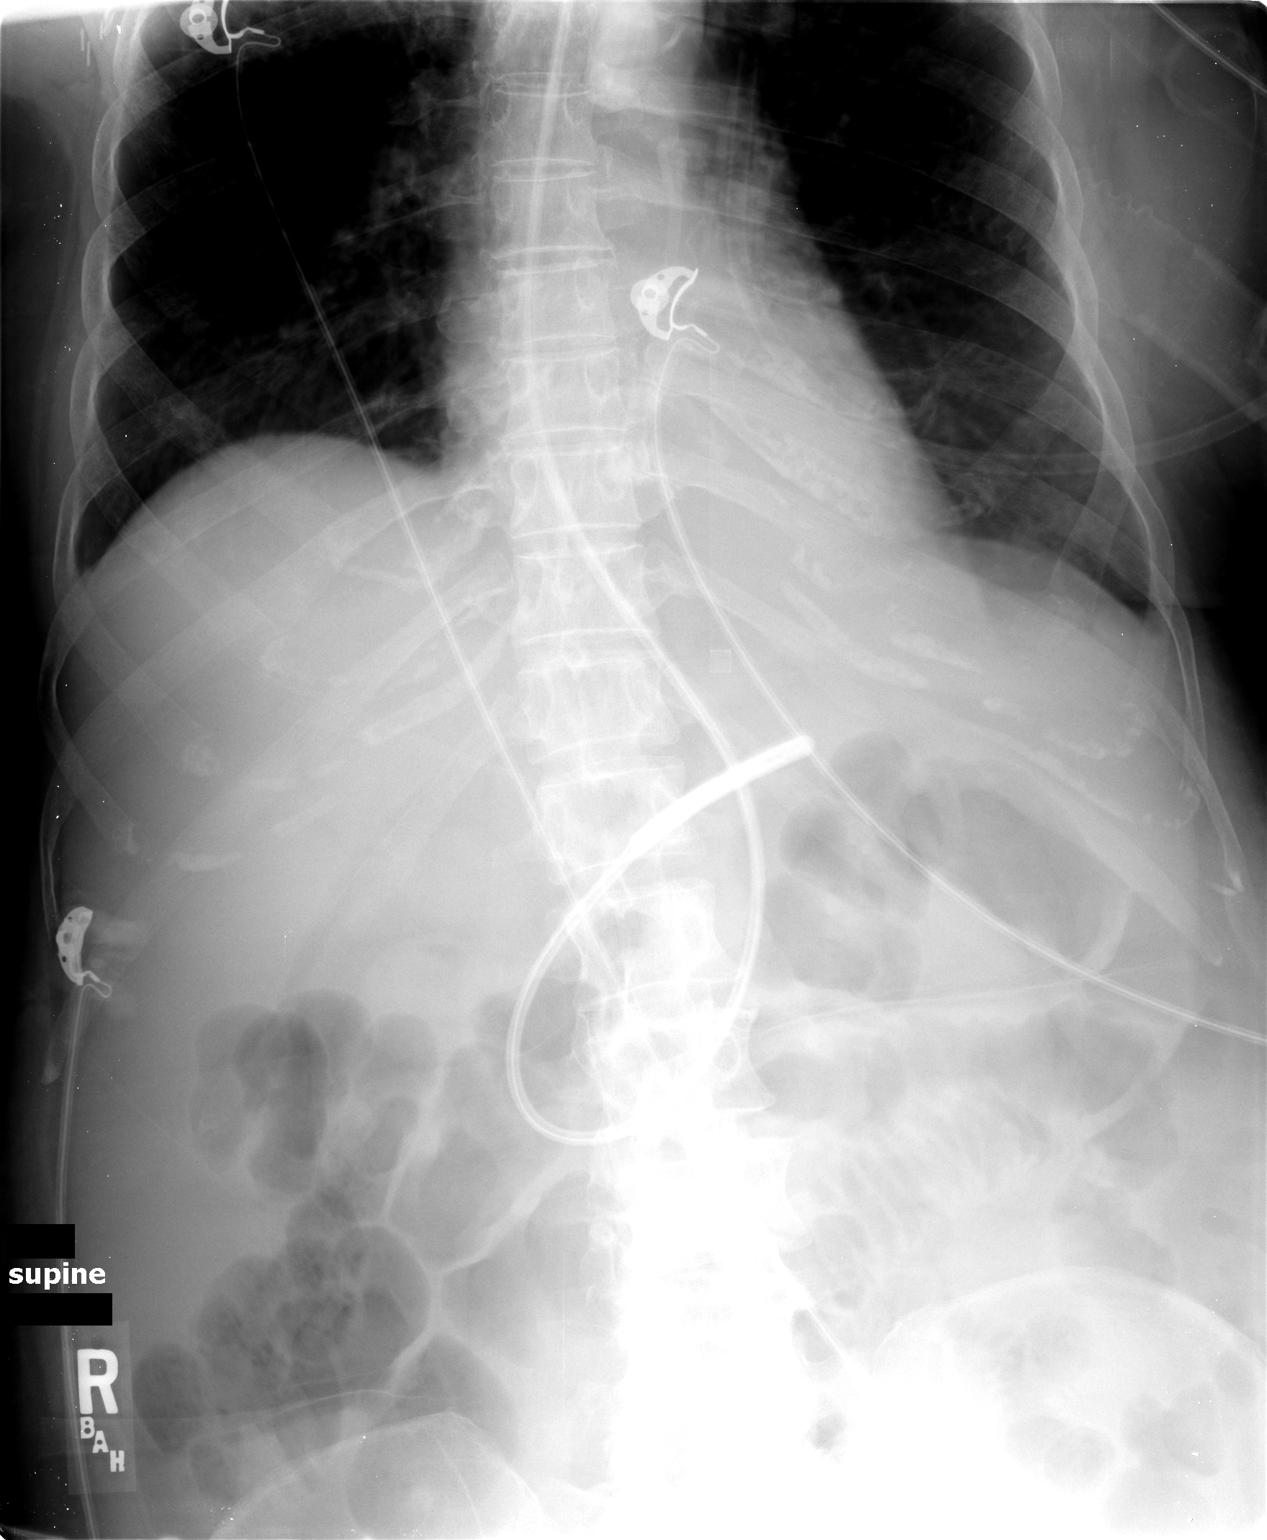

[1 of 1 positions shown; findings below may reference images not displayed]

FINDINGS: Panda tube is looped in the body of the stomach.  The tip
is in the fundus.

Bowel gas pattern is normal.  Atelectasis or consolidation at the
left lung base medially.
IMPRESSION: Panda tube tip looped in the mid stomach.

## 2012-11-26 ENCOUNTER — Encounter: Payer: Self-pay | Admitting: General Surgery

## 2012-11-26 ENCOUNTER — Ambulatory Visit (INDEPENDENT_AMBULATORY_CARE_PROVIDER_SITE_OTHER): Payer: Medicare Other | Admitting: General Surgery

## 2012-11-26 VITALS — BP 120/76 | HR 80 | Resp 16 | Ht 65.0 in | Wt 162.0 lb

## 2012-11-26 DIAGNOSIS — Z1239 Encounter for other screening for malignant neoplasm of breast: Secondary | ICD-10-CM

## 2012-11-26 DIAGNOSIS — Z853 Personal history of malignant neoplasm of breast: Secondary | ICD-10-CM | POA: Insufficient documentation

## 2012-11-26 NOTE — Progress Notes (Signed)
Patient ID: Angela David, female   DOB: 05-23-1940, 72 y.o.   MRN: [id number]  Chief Complaint  Patient presents with  . Follow-up    1 year follow up bilateral diagnostic mammogram     HPI Angela David is a 72 y.o. female who presents for a breast evaluation. The most recent mammogram was done on 11/13/12 with a birad category 1. Patient does perform regular self breast checks and gets regular mammograms done.  The patient denies any new problems with the breast at this time.    HPI  Past Medical History  Diagnosis Date  . Glaucoma   . Personal history of malignant neoplasm of breast   . High cholesterol   . Sleep apnea 2011  . Cancer 2002    right breast    Past Surgical History  Procedure Laterality Date  . Abdominal hysterectomy    . Hydrocele excision / repair    . Colonoscopy  2009  . Tracheal surgery  2013  . Breast surgery Right 2002    LUMPECTOMY, radiation, chemotherapy  . Breast biopsy  2008    Family History  Problem Relation Age of Onset  . Cancer Father     bone cancer  . Cancer Sister   . Cancer Brother     Social History History  Substance Use Topics  . Smoking status: Never Smoker   . Smokeless tobacco: Never Used  . Alcohol Use: No    Allergies  Allergen Reactions  . Codeine     Per patient "it kept her up all night"  . Eggs Or Egg-Derived Products     respitatory distress   . Shellfish Allergy     respitatory distress      Current Outpatient Prescriptions  Medication Sig Dispense Refill  . busPIRone (BUSPAR) 15 MG tablet Take 1 tablet by mouth 4 (four) times daily.      . ferrous sulfate 325 (65 FE) MG tablet Take 325 mg by mouth daily.      . folic acid (FOLVITE) 1 MG tablet Take 1 mg by mouth daily.      Marland Kitchen LORazepam (ATIVAN) 0.5 MG tablet Take 0.5 mg by mouth 3 (three) times daily.      Marland Kitchen lovastatin (MEVACOR) 40 MG tablet Take 1 tablet by mouth daily.      . metFORMIN (GLUCOPHAGE) 1000 MG tablet 1 tab by mouth in AM 1.5  tab by mouth in PM      . metoprolol tartrate (LOPRESSOR) 25 MG tablet Take 1 tablet by mouth 2 (two) times daily.      . pantoprazole (PROTONIX) 40 MG tablet Take 40 mg by mouth daily.      Marland Kitchen venlafaxine XR (EFFEXOR-XR) 75 MG 24 hr capsule Take 1 capsule by mouth 4 (four) times daily.       No current facility-administered medications for this visit.    Review of Systems Review of Systems  Constitutional: Negative.   Respiratory: Negative.   Cardiovascular: Negative.     Blood pressure 120/76, pulse 80, resp. rate 16, height 5\' 5"  (1.651 m), weight 162 lb (73.483 kg).  Physical Exam Physical Exam  Constitutional: She is oriented to person, place, and time. She appears well-developed and well-nourished.  Eyes: Conjunctivae are normal. No scleral icterus.  Neck:  Patient has tracheostomy in place appears to be functioning well.   Cardiovascular: Normal rate, regular rhythm and normal heart sounds.   No murmur heard. Pulses:  Dorsalis pedis pulses are 2+ on the right side, and 2+ on the left side.       Posterior tibial pulses are 2+ on the right side, and 2+ on the left side.  No edema  Pulmonary/Chest: Effort normal and breath sounds normal. Right breast exhibits no inverted nipple, no mass, no nipple discharge, no skin change and no tenderness. Left breast exhibits no inverted nipple, no mass, no nipple discharge, no skin change and no tenderness.  Right breast minimal scarring at the lumpectomy site.   Abdominal: Soft. Normal appearance and bowel sounds are normal. There is no hepatosplenomegaly. There is no tenderness. No hernia.  Lymphadenopathy:    She has no cervical adenopathy.    She has no axillary adenopathy.  Neurological: She is alert and oriented to person, place, and time.  Skin: Skin is warm and dry.    Data Reviewed Mammogram reviewed and stable.   Assessment    Stable exam 12 years following right breast cancer treatment     Plan    Patient to  return in 1 year with bilateral diagnostic mammogram.         Mylon Mabey G 11/26/2012, 6:53 PM

## 2012-11-26 NOTE — Patient Instructions (Addendum)
Patient to return in 1 year with bilateral diagnostic mammogram.  Patient to continue self breast checks. Also she is to contact our office with any new questions or concerns.

## 2012-11-27 IMAGING — CR DG CHEST 1V PORT
1 series · 1 of 1 positions shown · non-contrast
Comparison: Portable chest x-ray of 11/02/2010

CLINICAL DATA: Cough

PORTABLE CHEST - 1 VIEW

[AP]
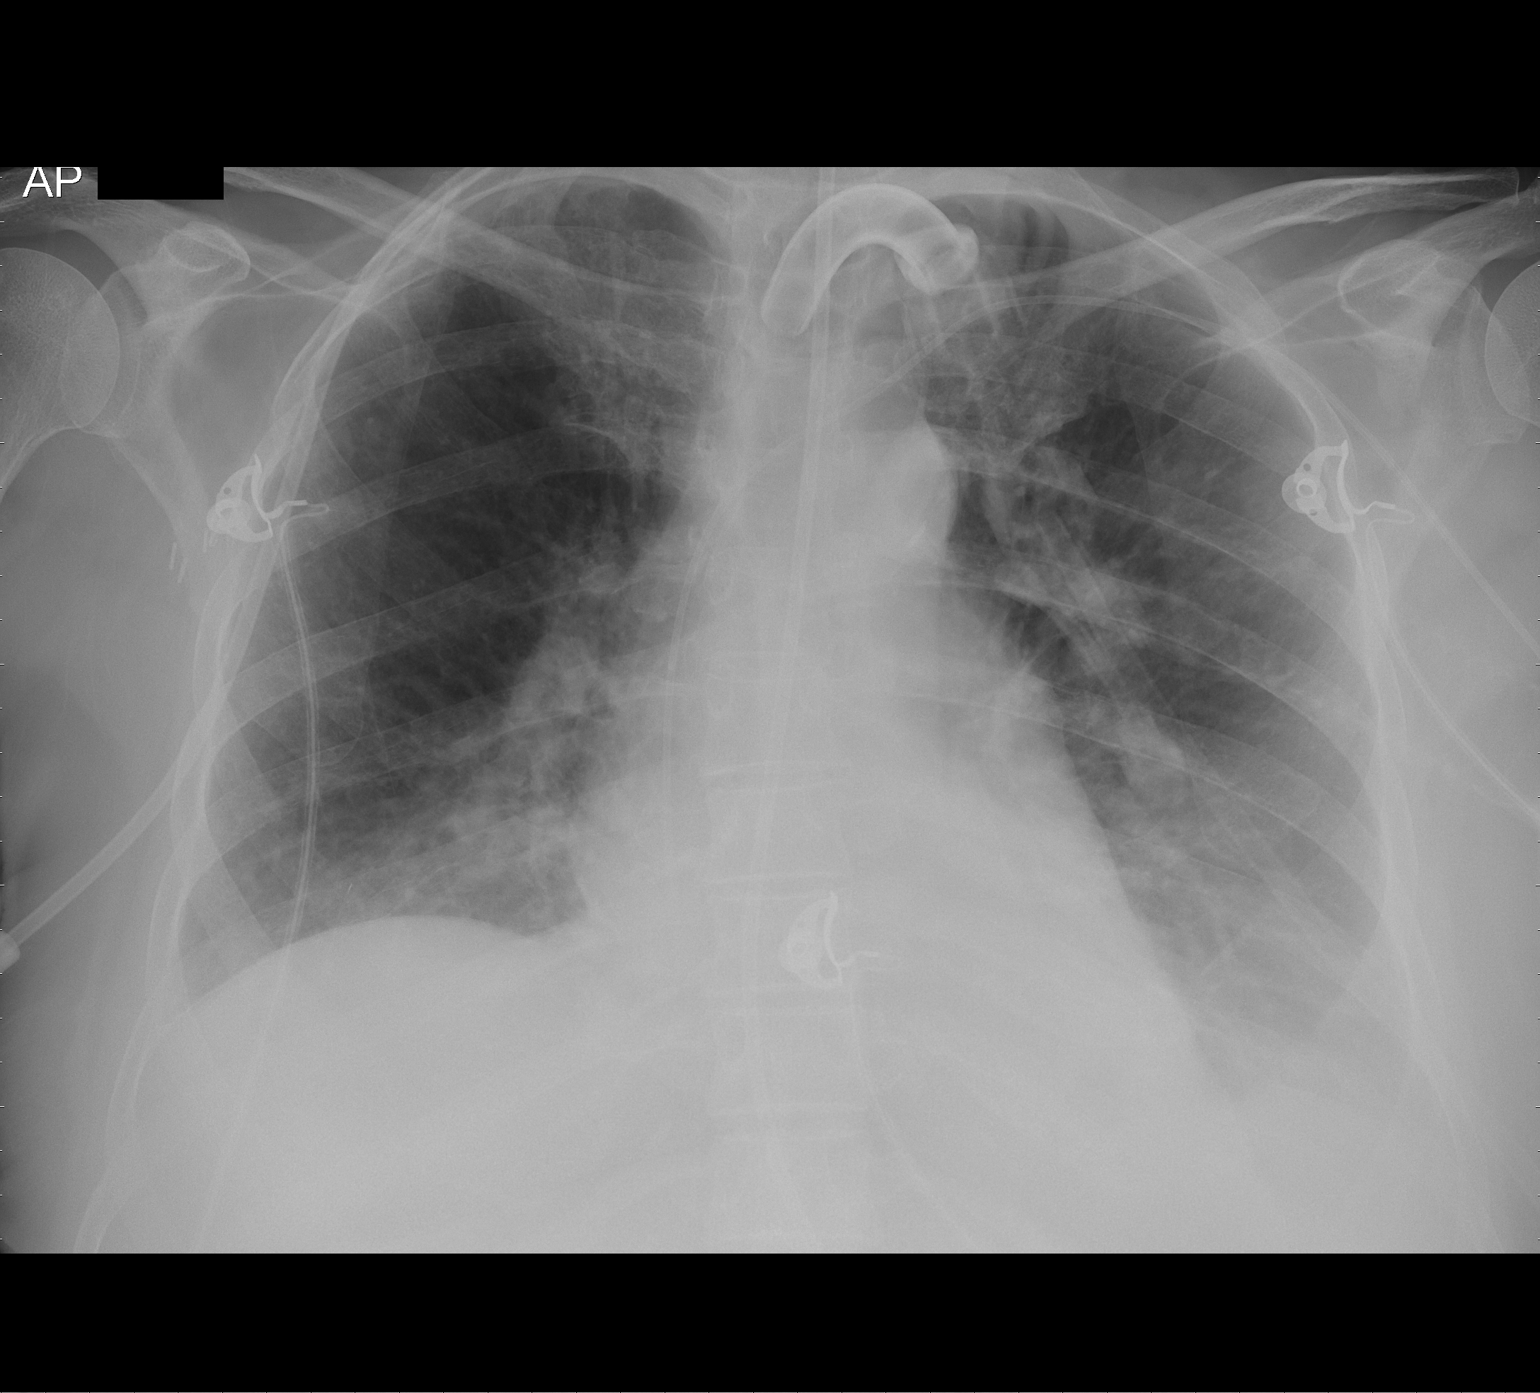

[1 of 1 positions shown; findings below may reference images not displayed]

FINDINGS: There is little change in haziness at the lung bases
consistent with atelectasis and effusions left greater than right.
There may be mild pulmonary vascular congestion present.  The right
central venous line has been removed.  Left central venous line and
tracheostomy as well as feeding tube remain.
IMPRESSION: Little change in basilar opacities consistent with atelectasis and
effusions left greater than right.  Mild pulmonary vascular
congestion.

## 2012-11-30 IMAGING — CR DG CHEST 1V PORT
1 series · 1 of 1 positions shown · non-contrast
Comparison: Chest x-ray 11/04/2010.

CLINICAL DATA: Fever and shortness of breath.

PORTABLE CHEST - 1 VIEW

[view not recorded]
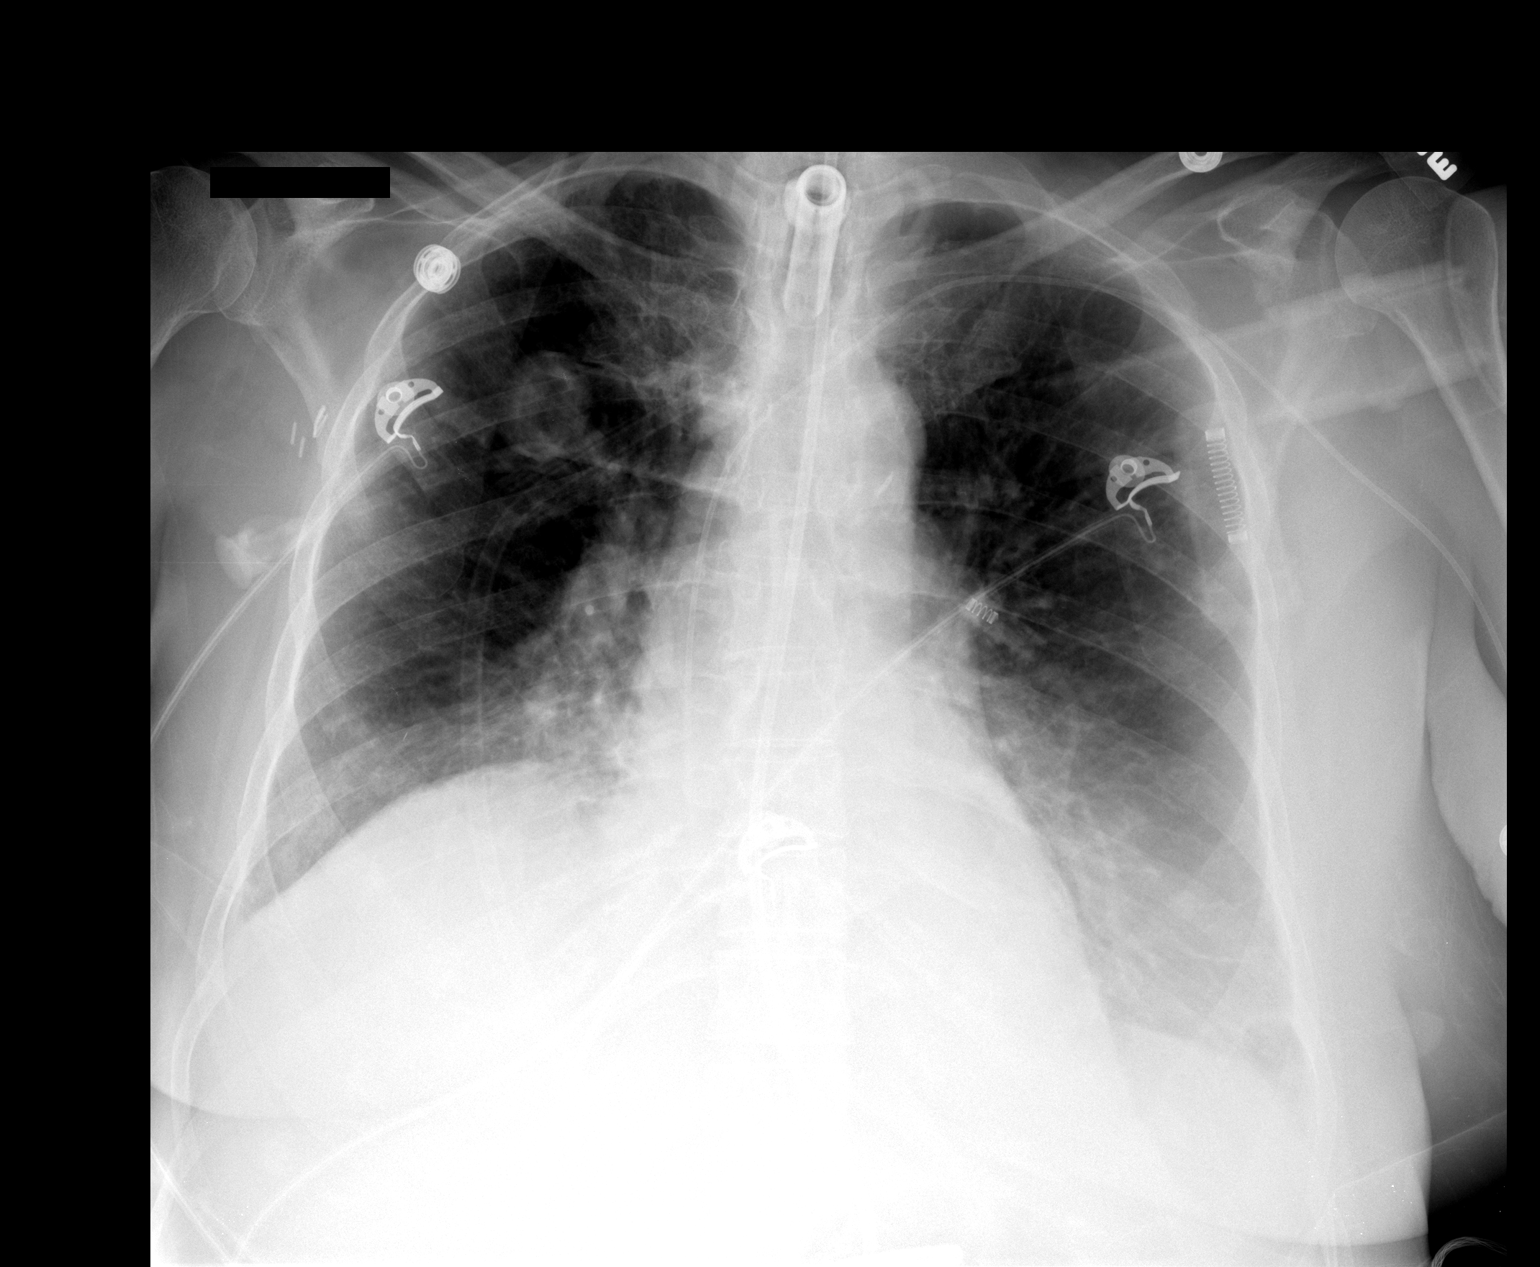

[1 of 1 positions shown; findings below may reference images not displayed]

FINDINGS: The tracheostomy tube is stable.  The Panda tube is
stable.  The left PICC line is unchanged.  Persistent elevation of
the right hemidiaphragm with overlying vascular crowding and
atelectasis.  Overall the lung shows slight improved aeration.
IMPRESSION: 1.  Stable support apparatus.
2.  Slight improved lung aeration.

## 2012-12-10 IMAGING — CR DG ABD PORTABLE 1V
1 series · 1 of 1 positions shown · non-contrast
Comparison: 11/02/2010

CLINICAL DATA: Retraction of feeding tube.

ABDOMEN - 1 VIEW

[view not recorded]
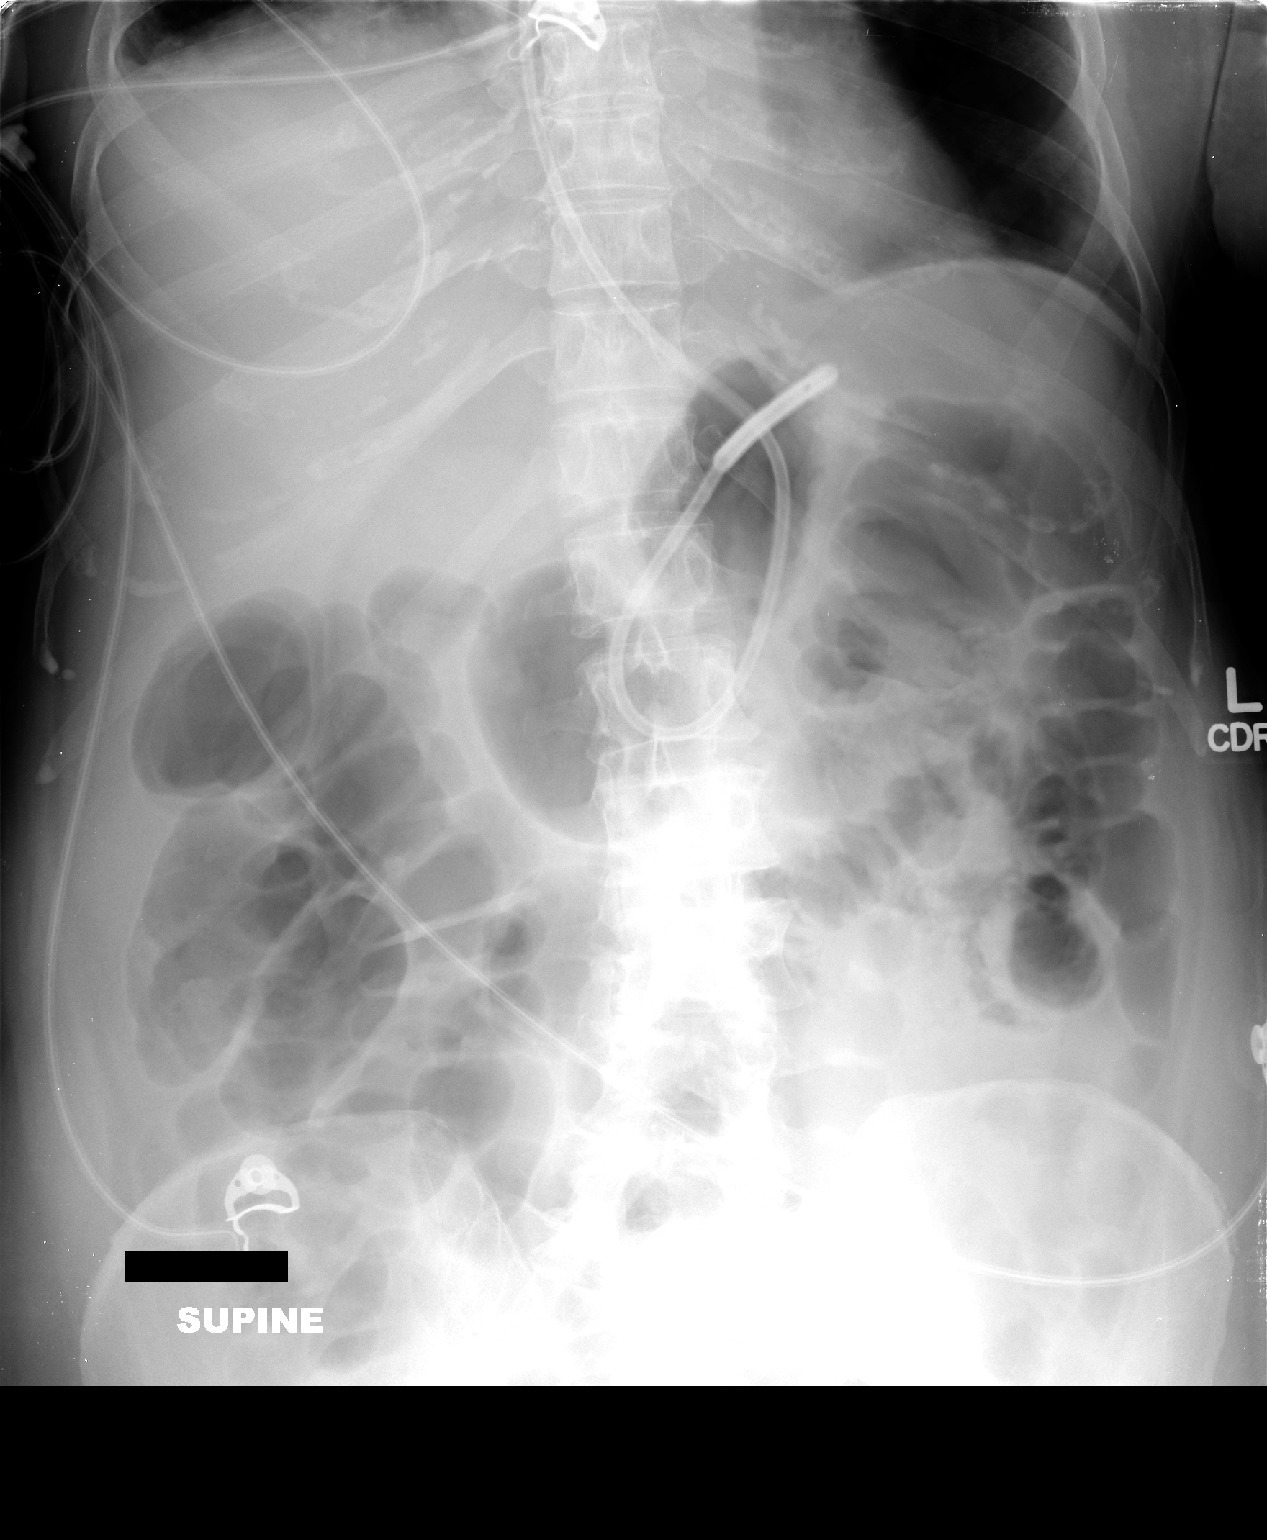

[1 of 1 positions shown; findings below may reference images not displayed]

FINDINGS: Feeding tube is in a similar position compared to the
prior radiograph with the tube partially coiled in the stomach and
the tip located towards the proximal stomach.
IMPRESSION: No significant change in the feeding tube positioning with the tube
coiled in the stomach.

## 2012-12-12 IMAGING — CR DG ABD PORTABLE 1V
1 series · 1 of 1 positions shown · non-contrast
Comparison: Abdominal radiograph performed 11/17/2010

CLINICAL DATA: Panda tube placement.

ABDOMEN - 1 VIEW

[AP]
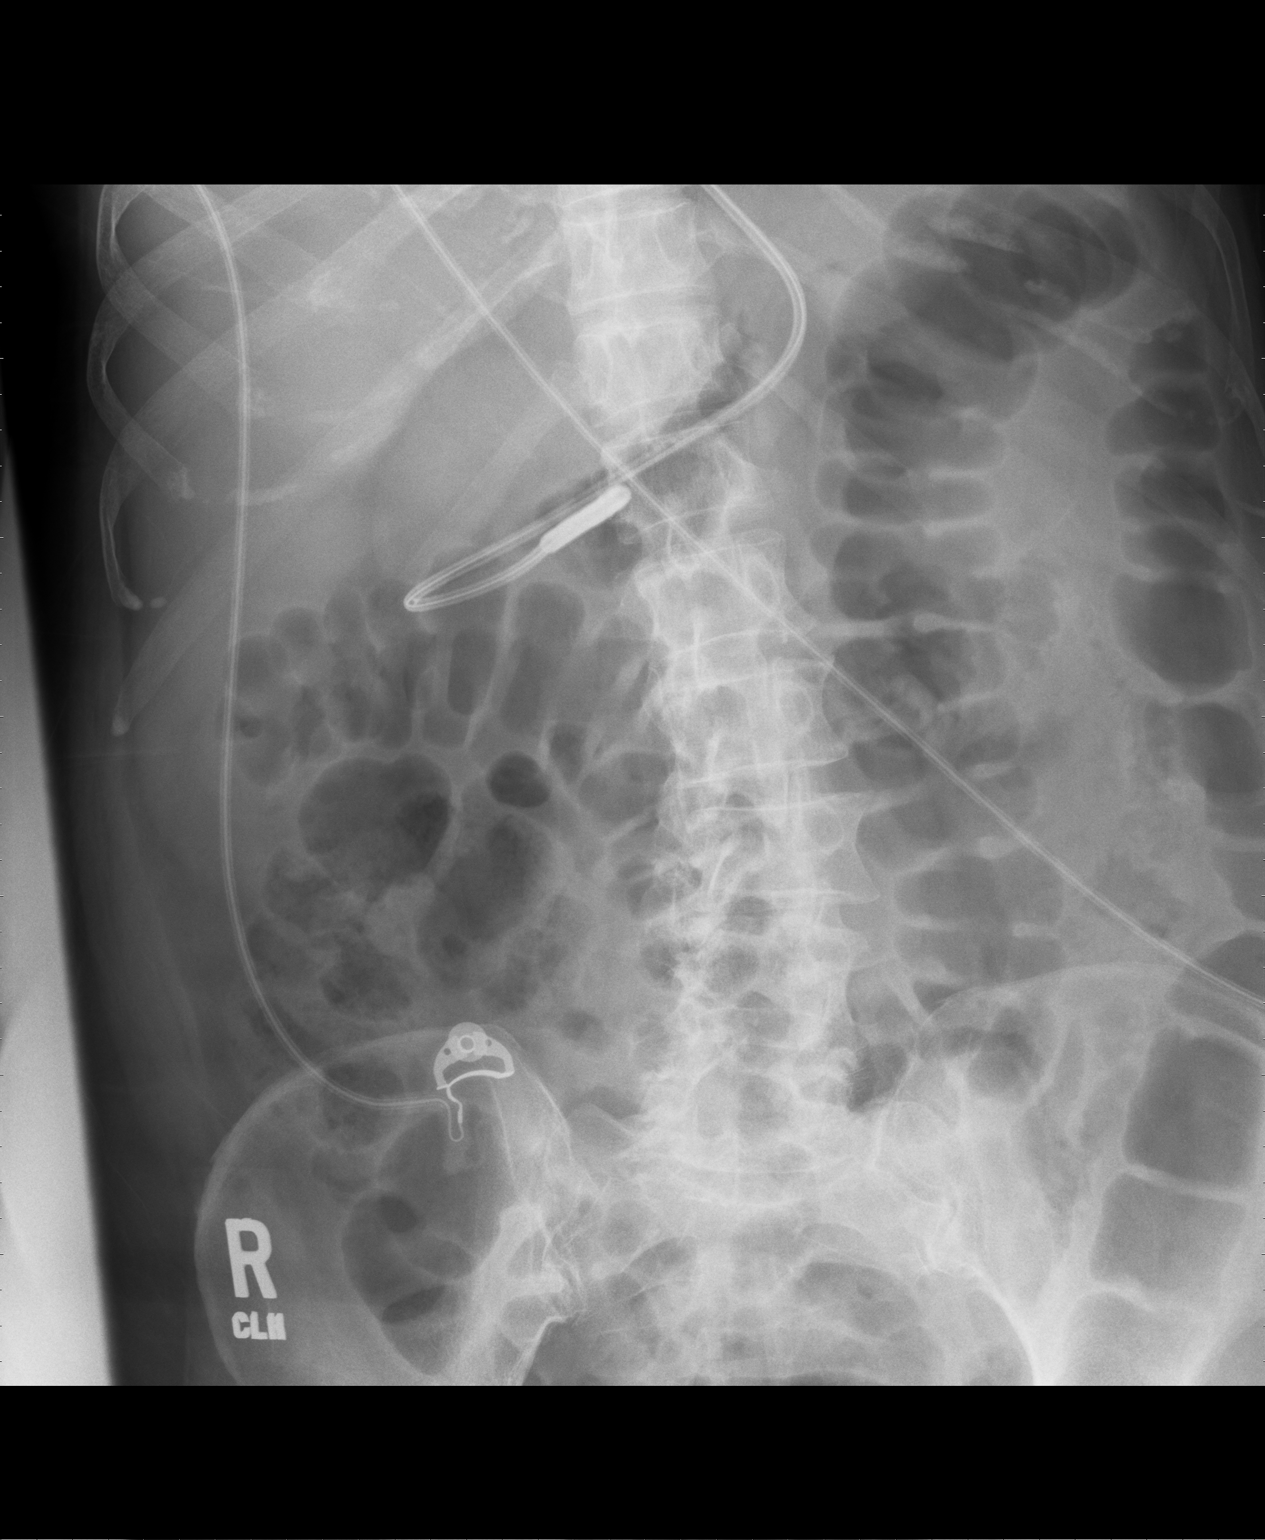

[1 of 1 positions shown; findings below may reference images not displayed]

FINDINGS: The patient's Panda tube is noted coiling at the antrum
of the stomach, at the level of the pylorus.  This will likely
progress further with peristalsis.

The visualized bowel gas pattern is grossly unremarkable, with air-
filled loops of colon noted.  There is no evidence for bowel
obstruction; no definite free intra-abdominal air is seen, though
evaluation for free air is suboptimal on a single supine view.

Mild degenerative change is noted along the lumbar spine.  No acute
osseous abnormalities are seen.
IMPRESSION: Panda tube noted coiling at the antrum of the stomach, at the level
of the pylorus; this will likely progress further with peristalsis.

## 2012-12-15 IMAGING — CR DG SHOULDER 2+V*R*
3 series · 3 of 3 positions shown · non-contrast
Comparison: None.

CLINICAL DATA: Right shoulder pain.  No known injury.

RIGHT SHOULDER - 2+ VIEW

[w shoulder external right]
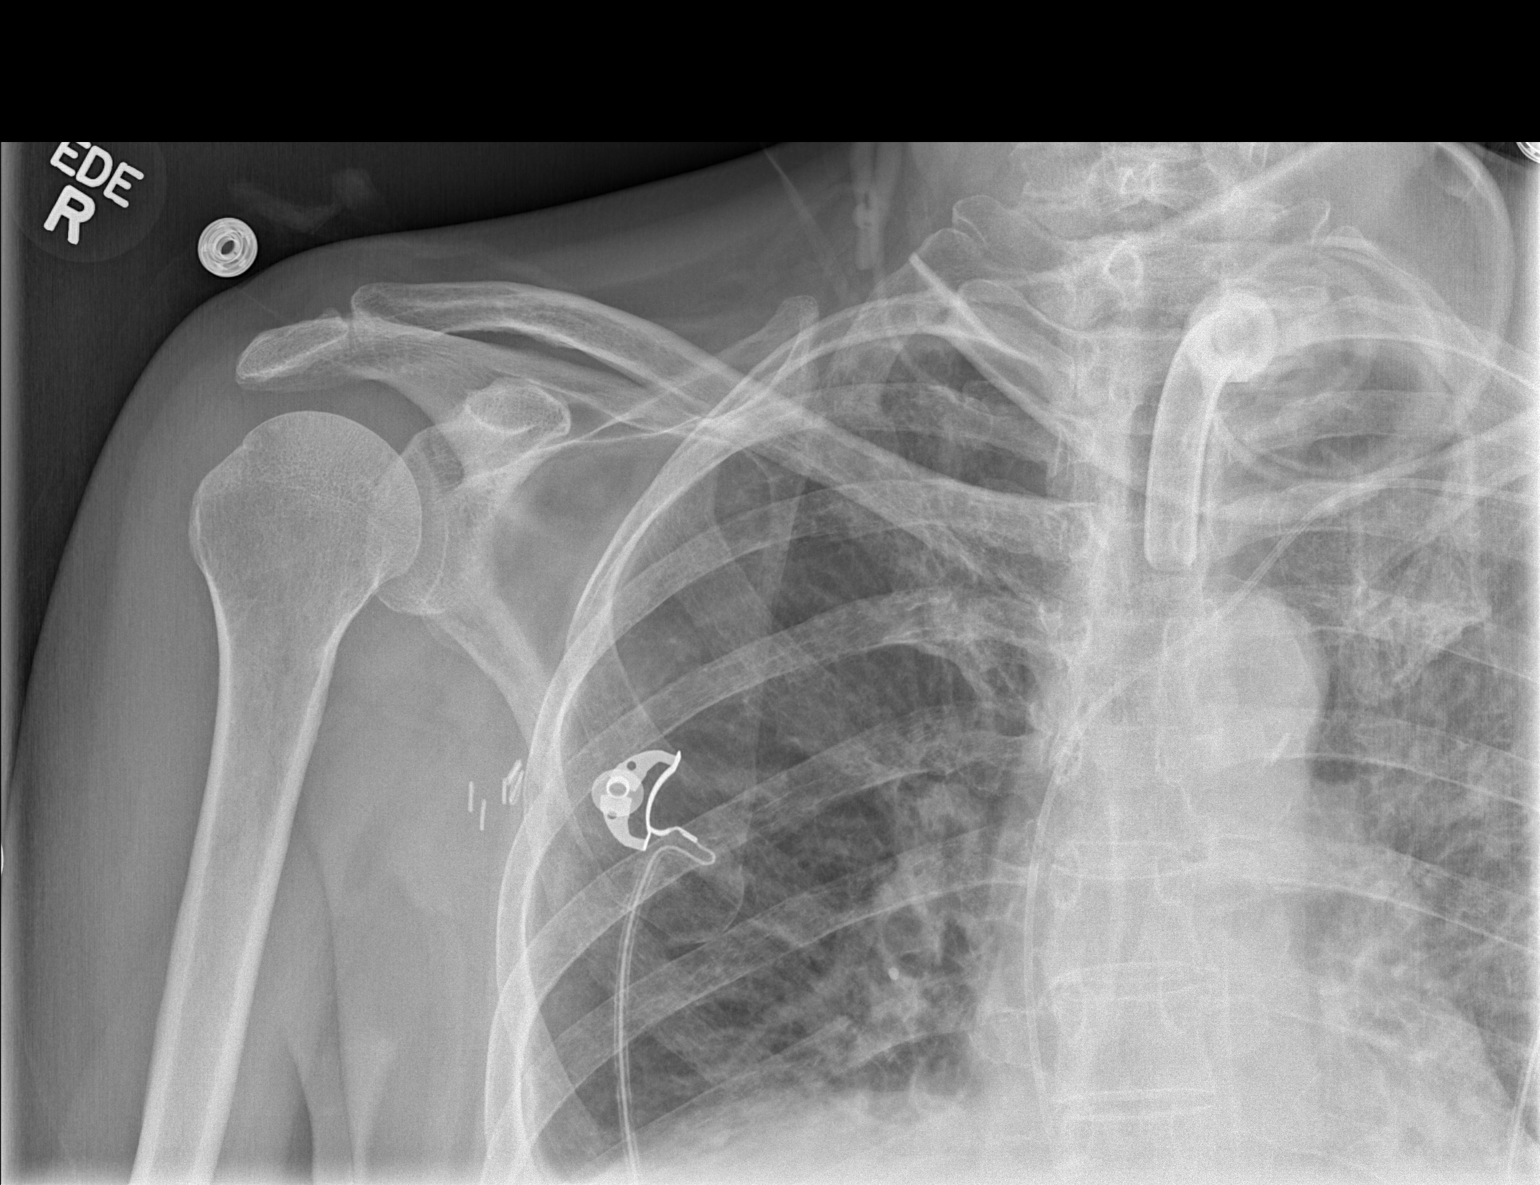

[w shoulder internal right]
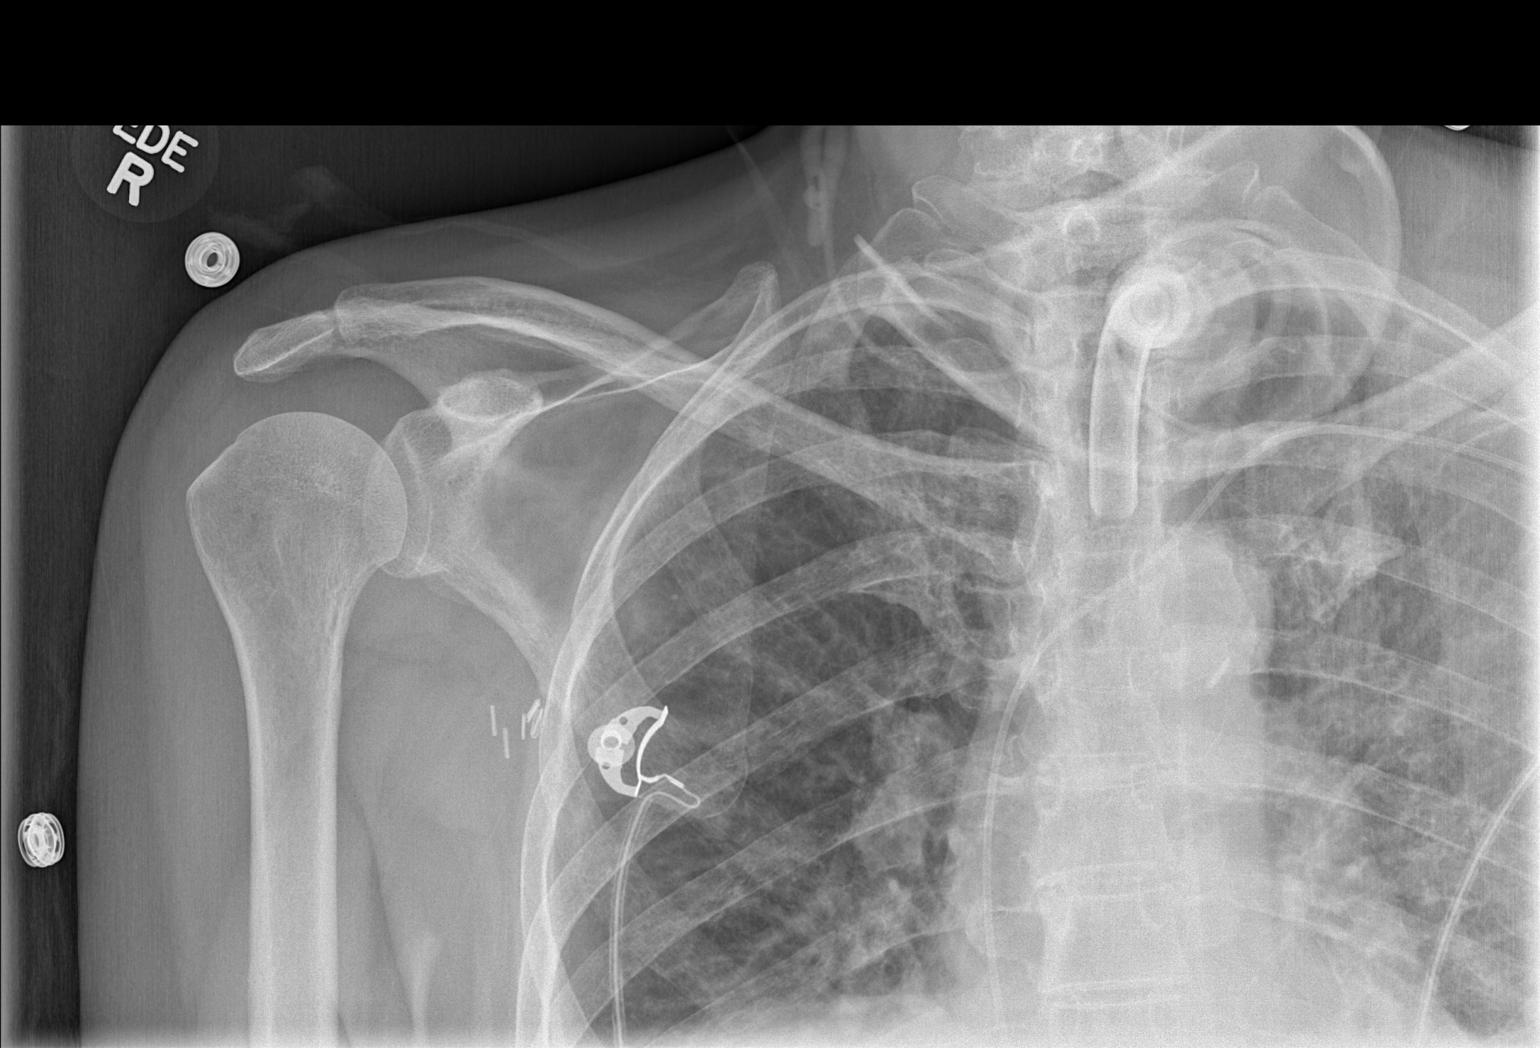

[w shoulder y-view right]
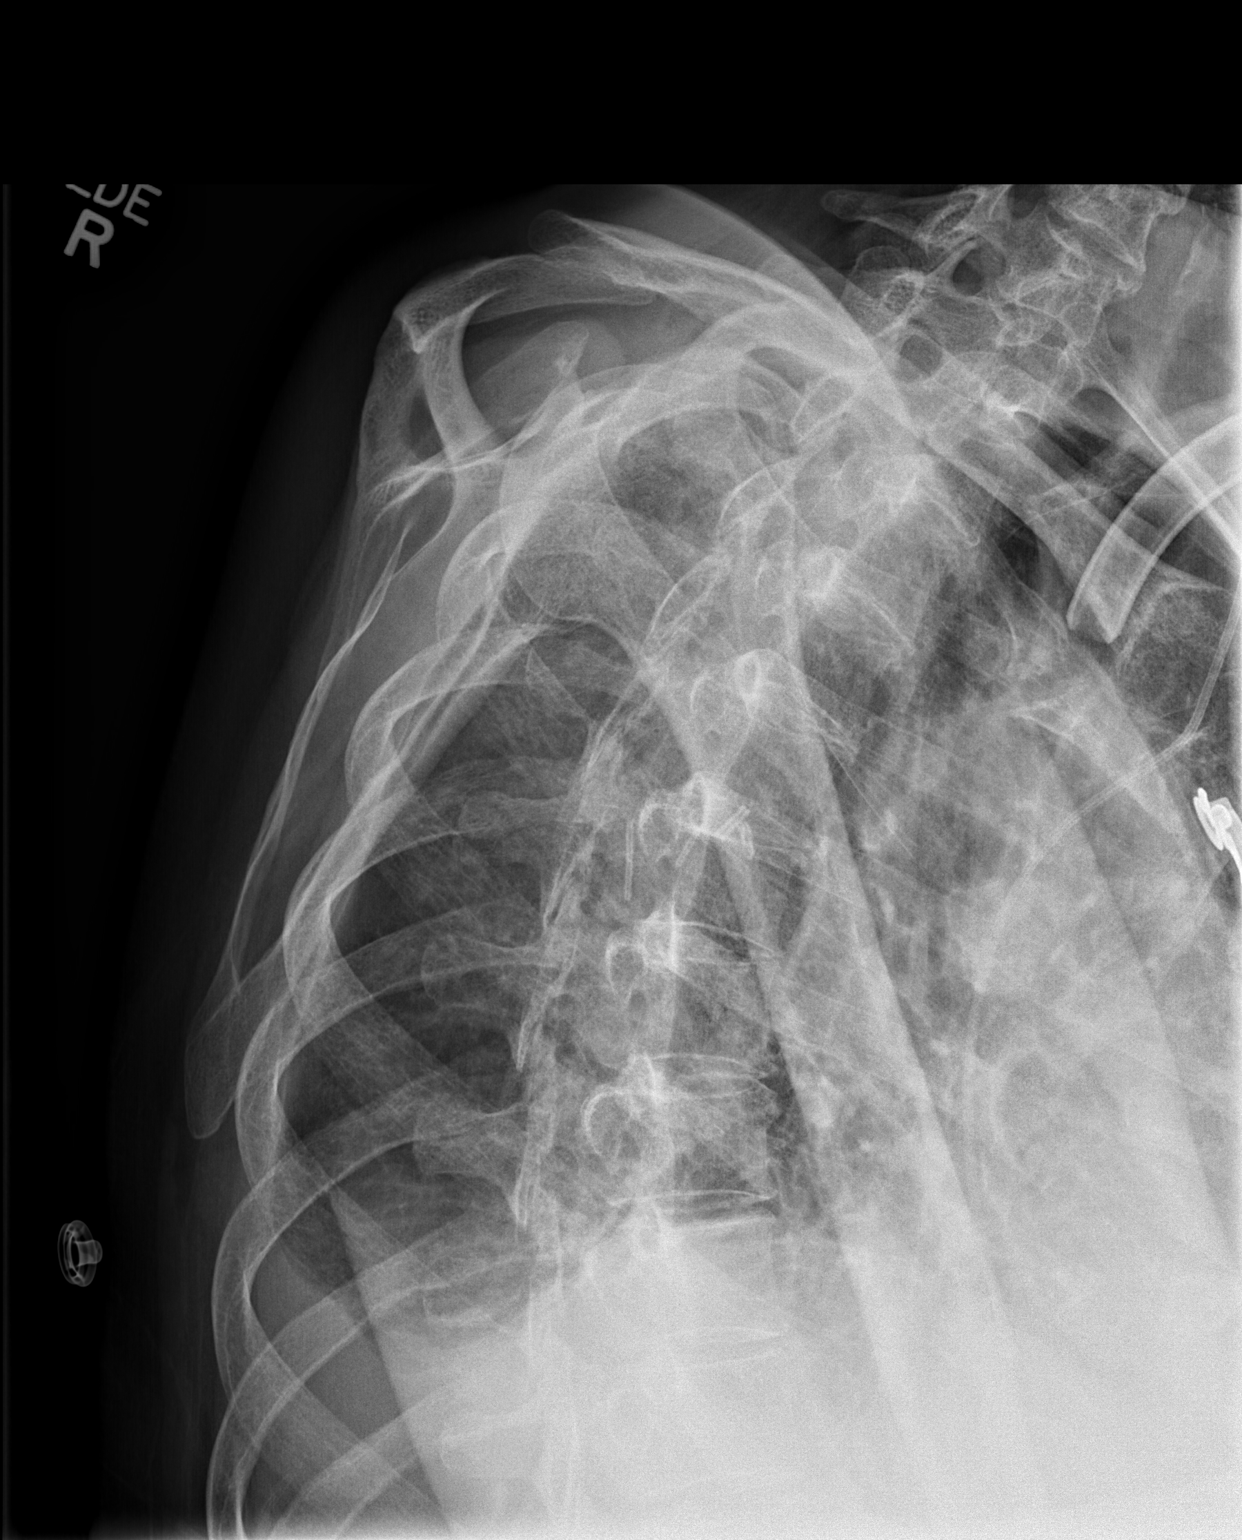

[3 of 3 positions shown; findings below may reference images not displayed]

FINDINGS: The humerus is located and the acromioclavicular joint is
intact. Mild acromioclavicular degenerative change is noted.  Clips
in the right axilla are also noted.  Tracheostomy tube is in place.
Imaged right lung and ribs appear clear.
IMPRESSION: Mild acromioclavicular degenerative disease.  No acute finding.

## 2012-12-22 IMAGING — CR DG CHEST 2V
2 series · 2 of 2 positions shown · non-contrast
Comparison: Chest x-ray 11/07/2010.

CLINICAL DATA: Cough and shortness of breath.

CHEST - 2 VIEW

[w chest pa]
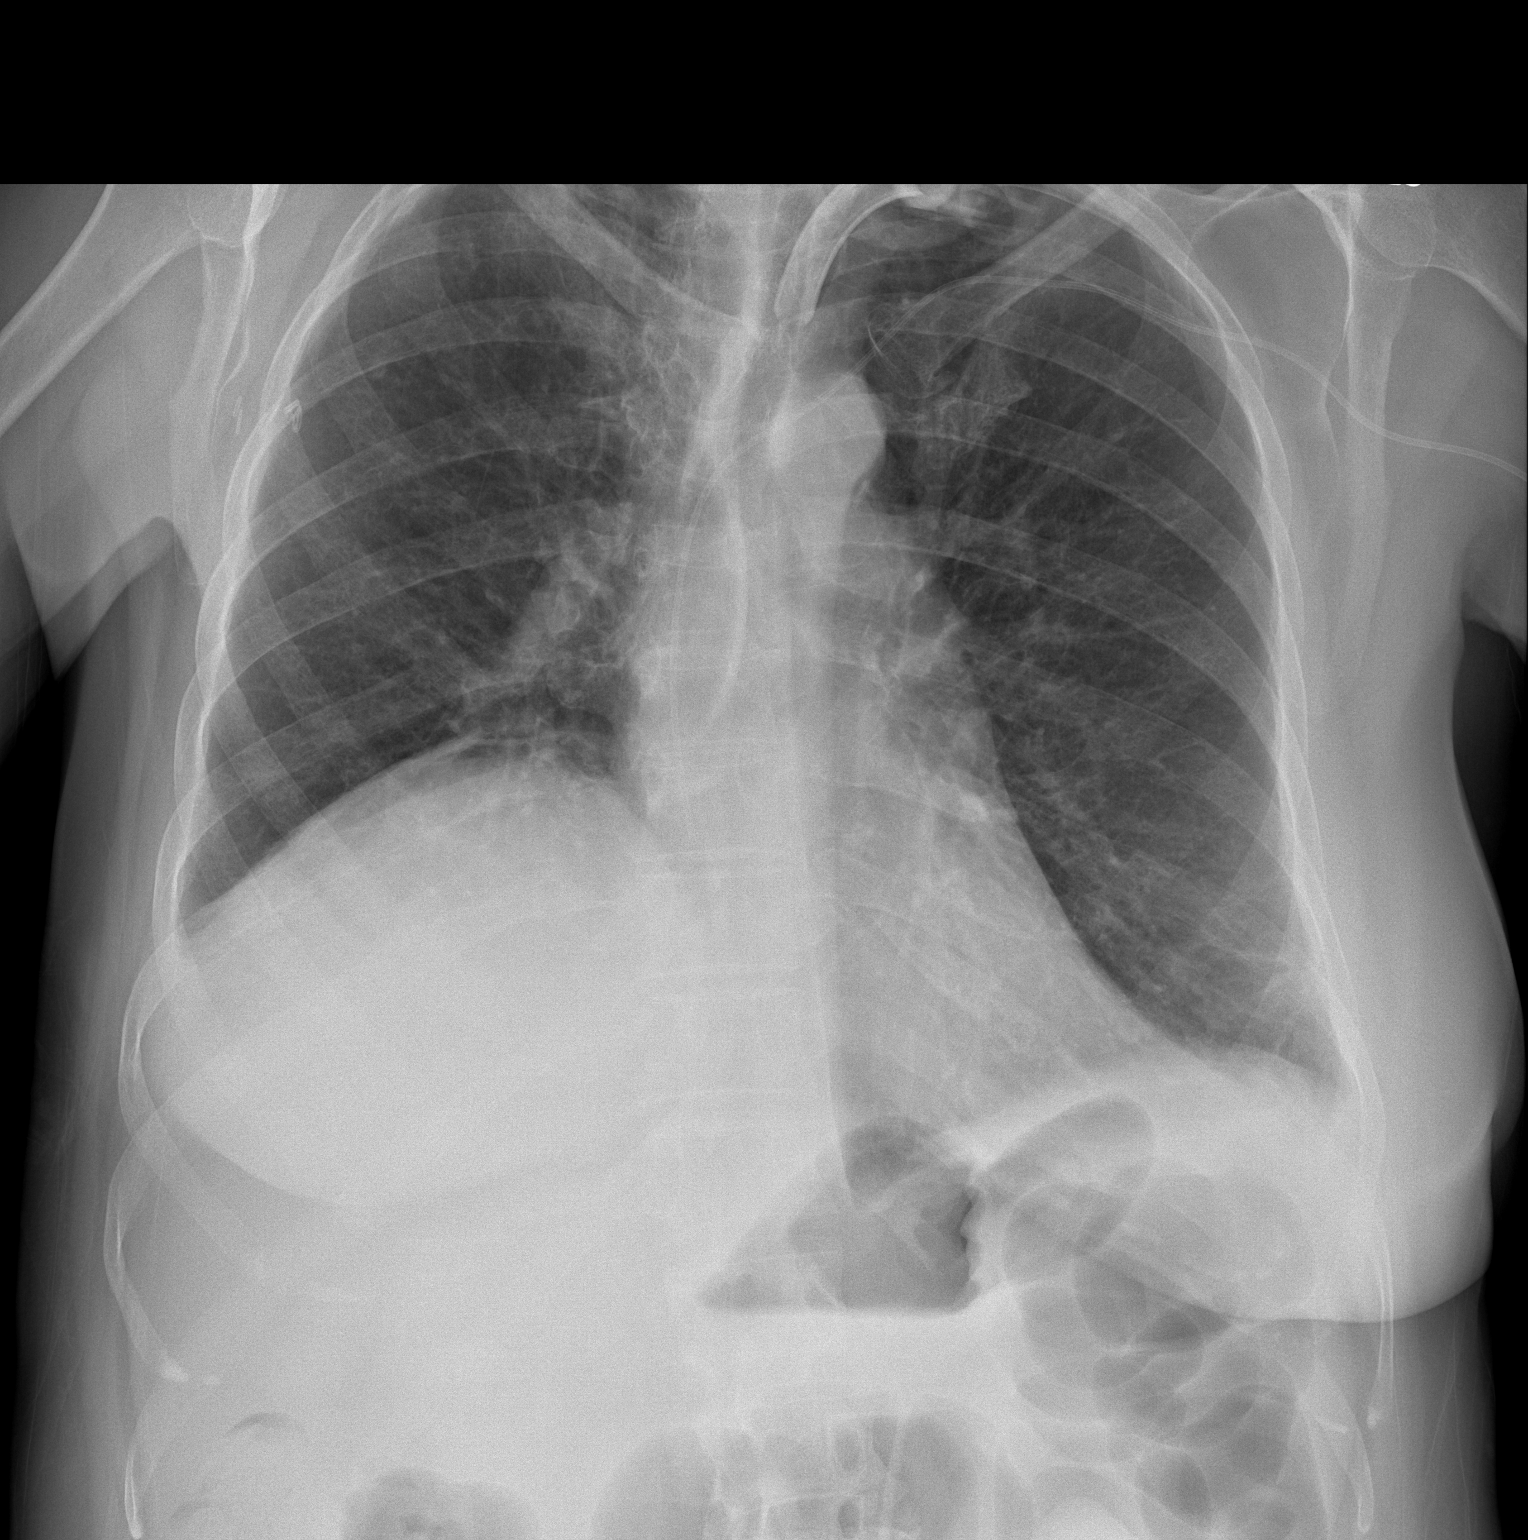

[w chest lat]
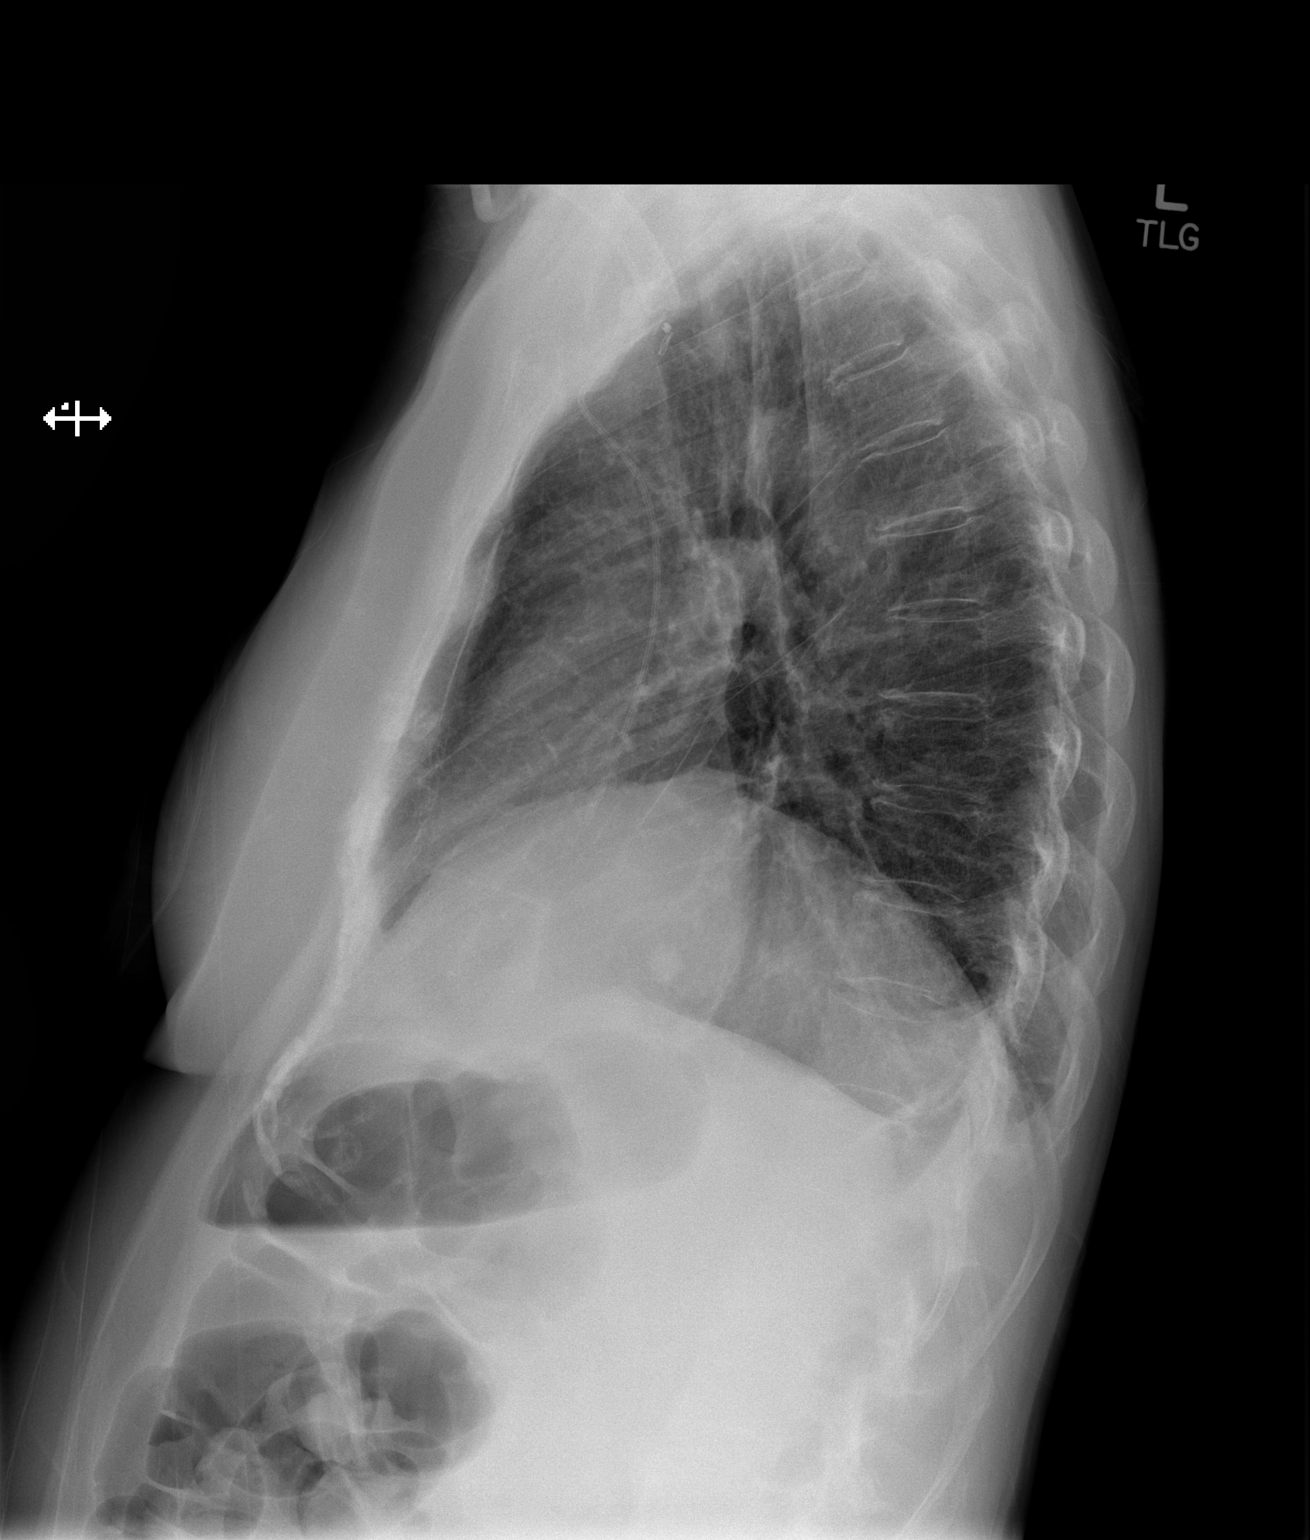

[2 of 2 positions shown; findings below may reference images not displayed]

FINDINGS: The tracheostomy tube is stable.  The Panda tube has been
removed.  The left PICC line is unchanged.  The lungs show much
improved aeration.  There is persistent elevation of the right
hemidiaphragm with overlying vascular crowding and minimal
bibasilar atelectasis.  No definite pleural effusion.
IMPRESSION: 1.  Tracheostomy tube and left PICC line are stable.
2.  Much improved lung aeration since the prior chest x-ray.
Minimal streaky basilar atelectasis.

## 2012-12-30 IMAGING — CR DG CHEST 1V PORT
1 series · 1 of 1 positions shown · non-contrast
Comparison: none

REASON FOR EXAM: Shortness of Breath
COMMENTS:

[view not recorded]
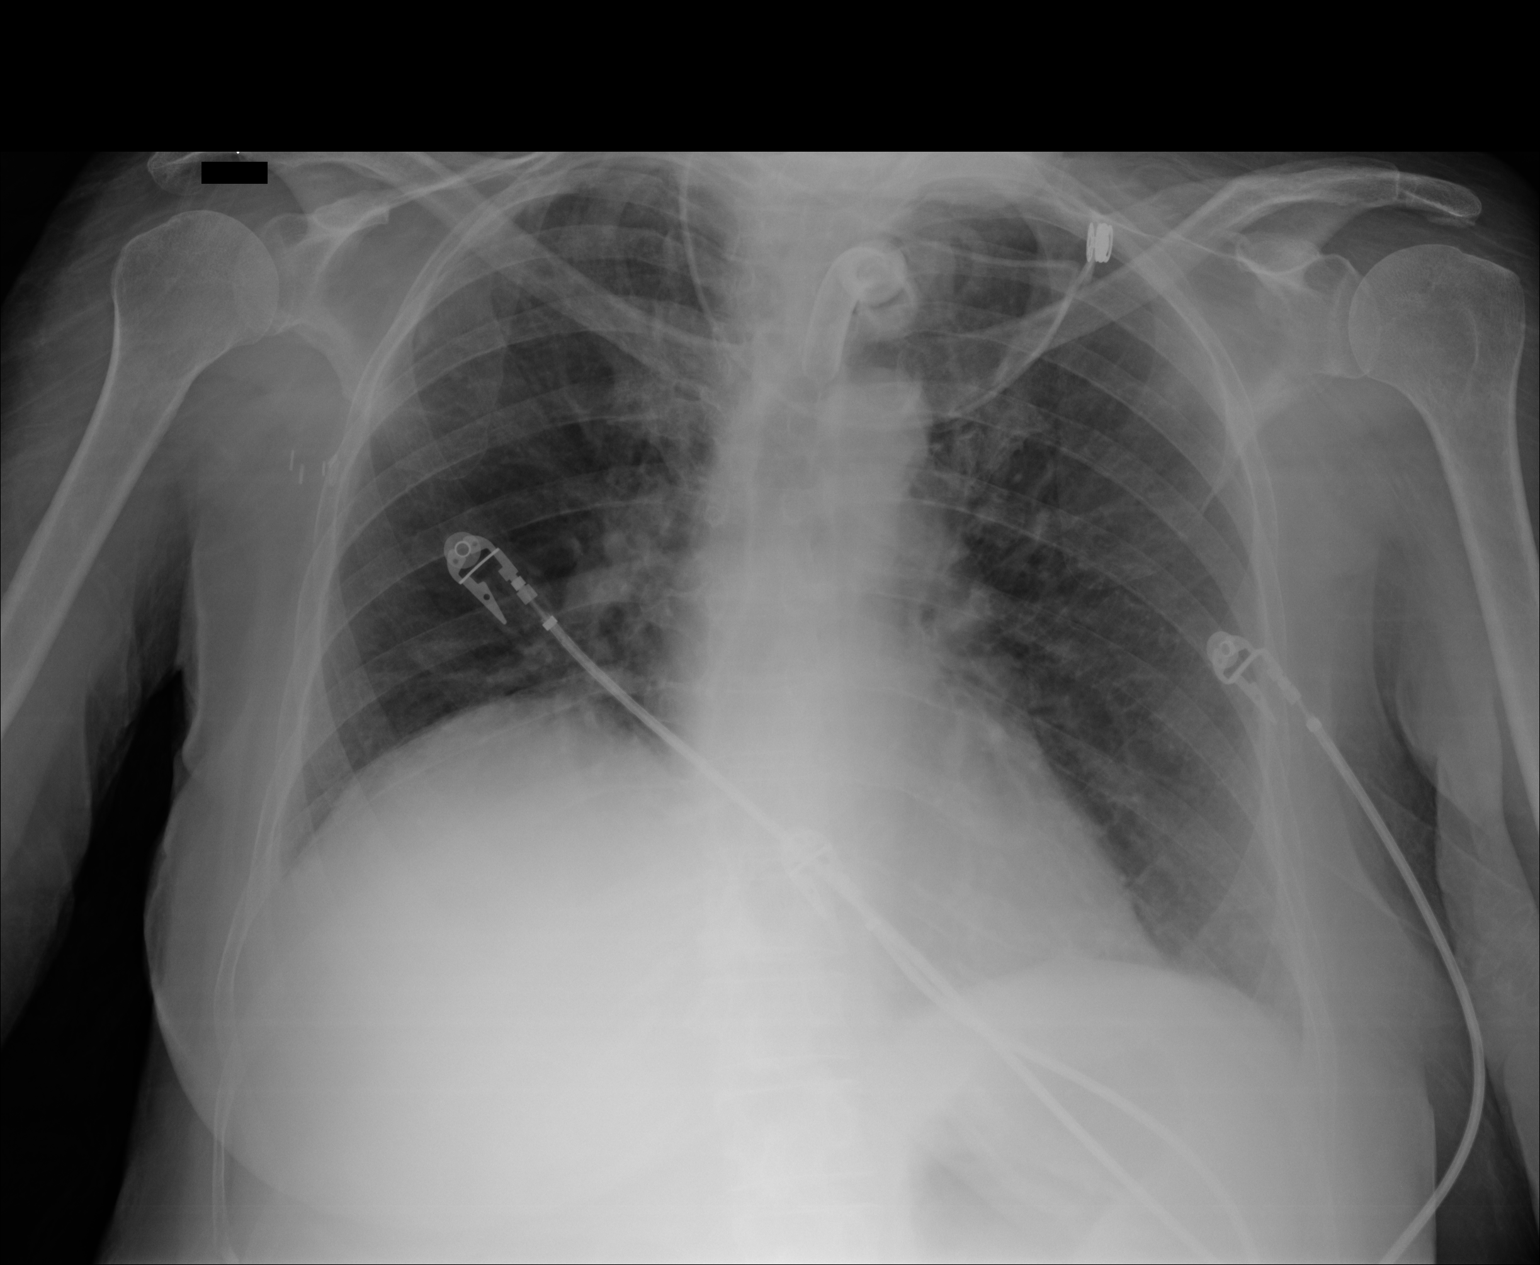

[1 of 1 positions shown; findings below may reference images not displayed]

PROCEDURE:     DXR - DXR PORTABLE CHEST SINGLE VIEW  - December 07, 2010 [DATE]

RESULT:     Comparison is made to the study of 11/01/2010.

Tracheostomy tube is present. Patchy density at the right lung base is less
prominent than seen previously. The interstitial markings are prominent.
Mild peribronchial thickening is present. The heart is normal in size. There
is mild elevation of the right hemidiaphragm.
IMPRESSION: Mild interstitial edema versus bronchitis. Basilar
atelectasis.

## 2013-01-02 IMAGING — CT CT CHEST W/ CM
1 of 2 series · 15 of 30 positions shown, 19 images · IV contrast (APPLIED)
Comparison: none

REASON FOR EXAM: [place], sob with ambulation, h/o breast cancer, assess for
PE.
COMMENTS:

PROCEDURE:     CT  - CT CHEST (FOR PE) W  - December 10, 2010  [DATE]
RESULT:
TECHNIQUE: CT of the chest is performed with 75 ml of Ksovue-5LN iodinated
intravenous contrast with images reconstructed at 3.0 mm slice thickness in
the axial plane.
There is no previous exam for comparison.

[Series 5: soft tissue · axial · 0.67mm/px · z∈[-33,+225]mm · 15 of 98 slices shown, 19 images]
[im 6/98  mediastinal]
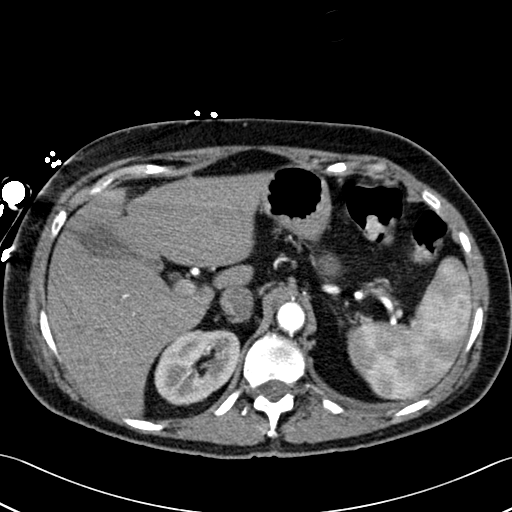
[im 6/98  lung]
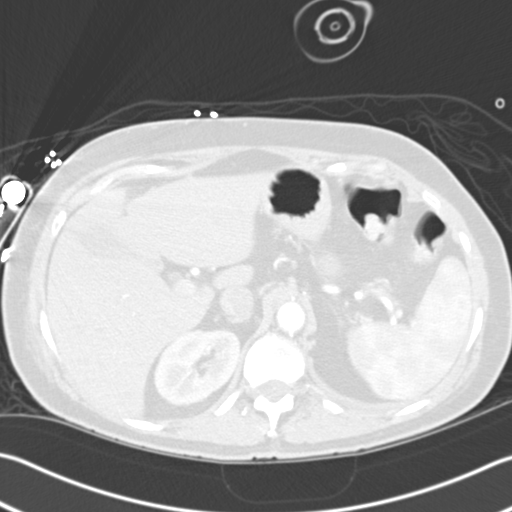
[im 11/98  lung]
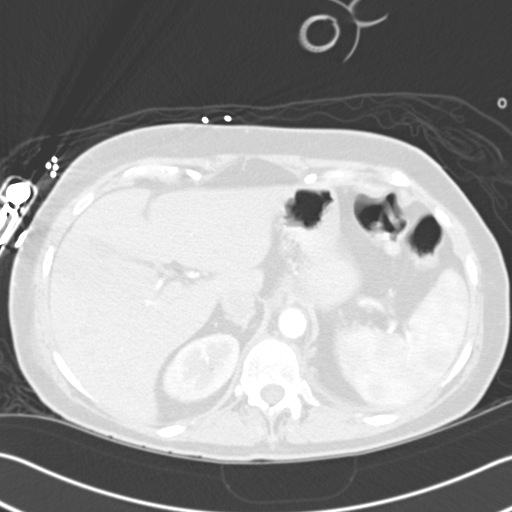
[im 22/98  lung]
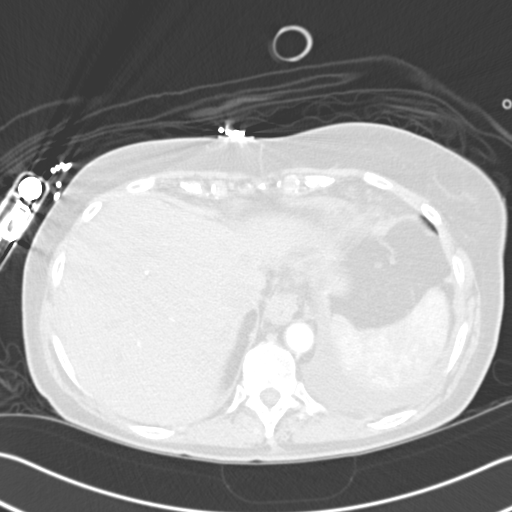
[im 27/98  lung]
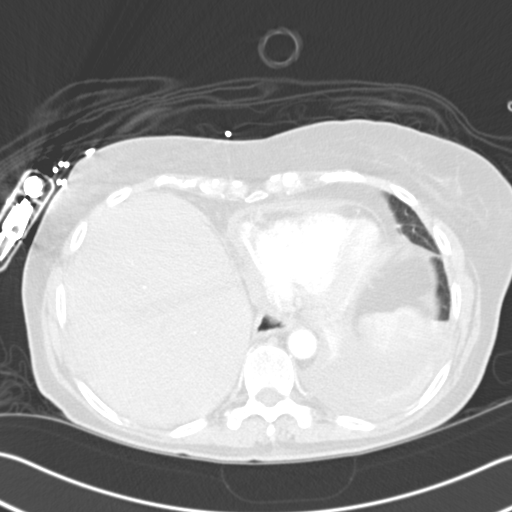
[im 33/98  mediastinal]
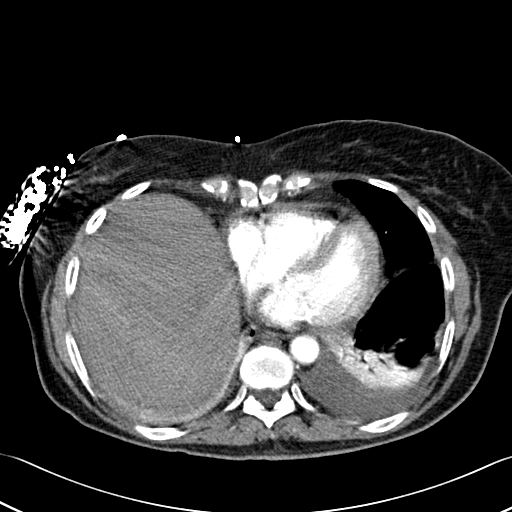
[im 33/98  lung]
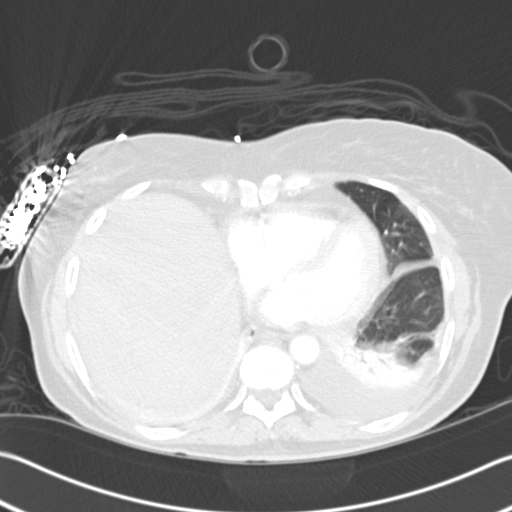
[im 38/98  lung]
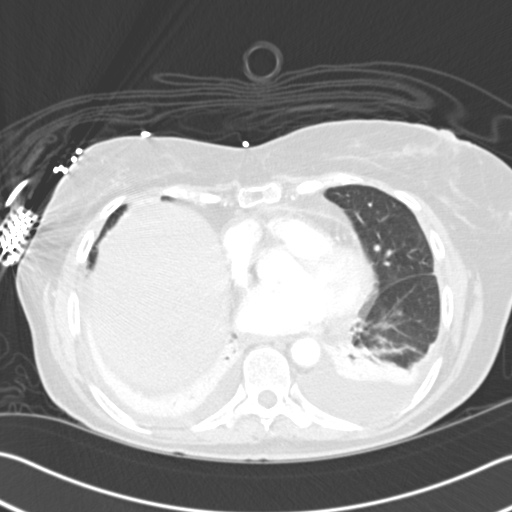
[im 44/98  lung]
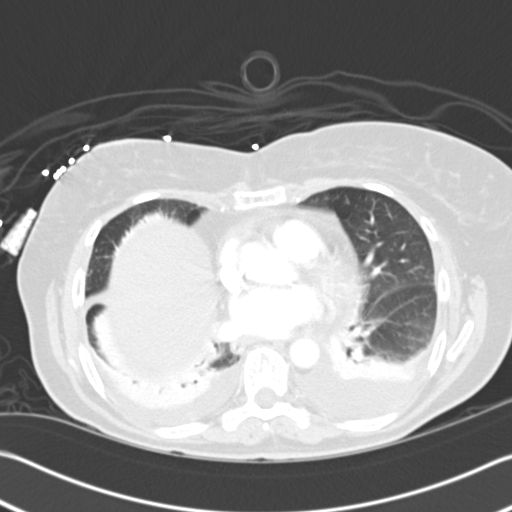
[im 49/98  lung]
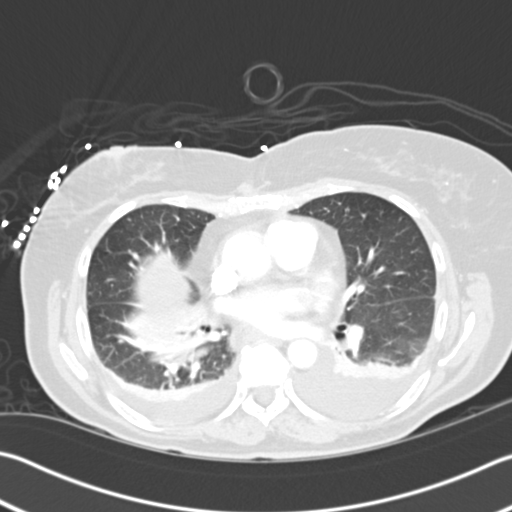
[im 54/98  mediastinal]
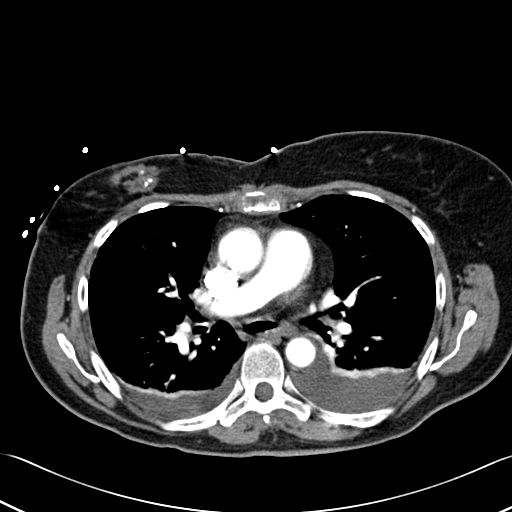
[im 54/98  lung]
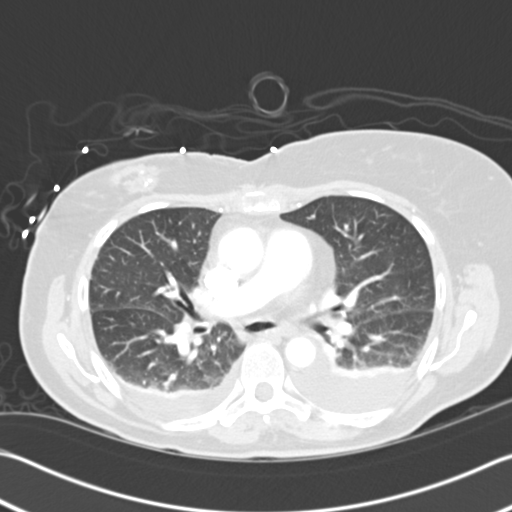
[im 60/98  lung]
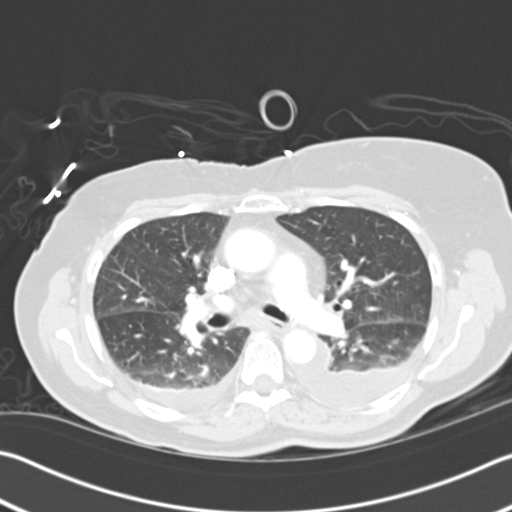
[im 65/98  lung]
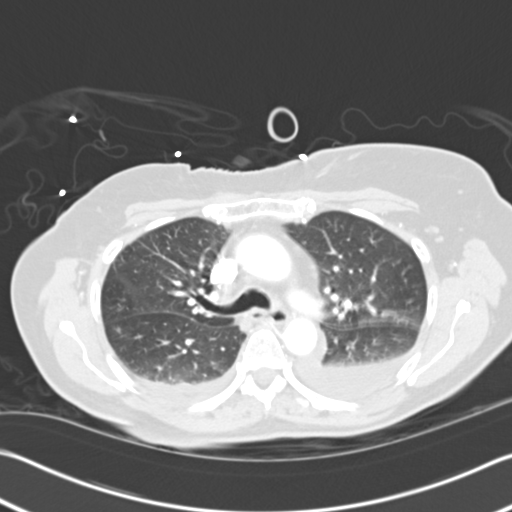
[im 71/98  lung]
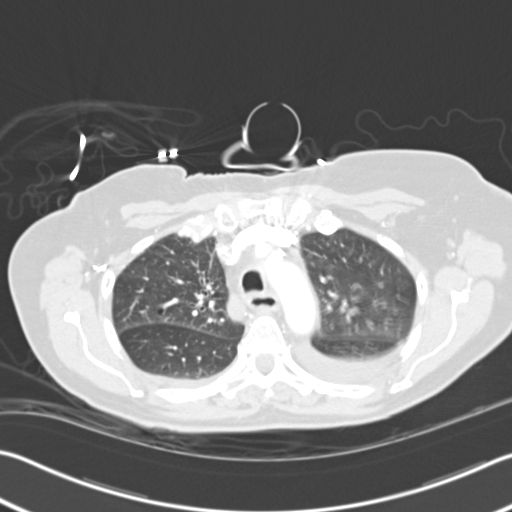
[im 81/98  mediastinal]
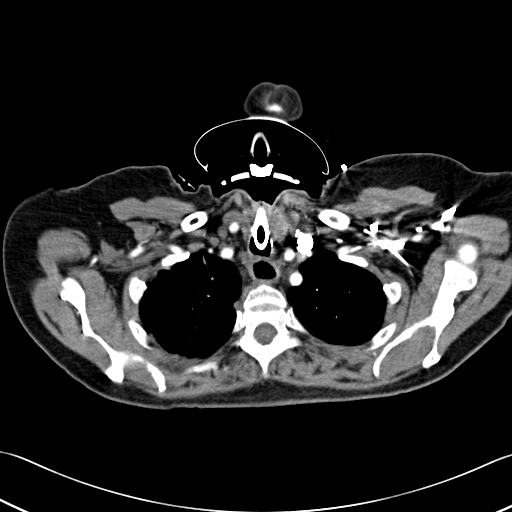
[im 81/98  lung]
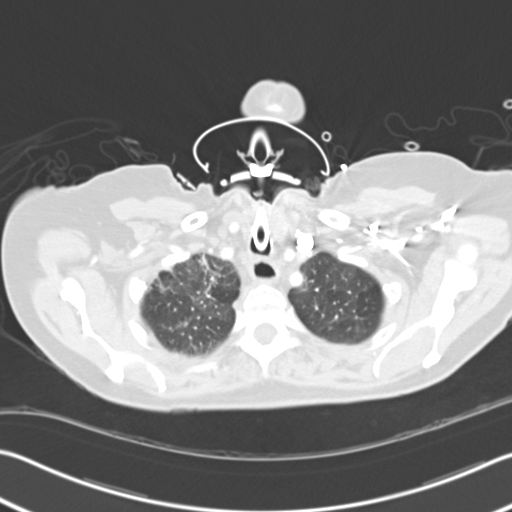
[im 87/98  lung]
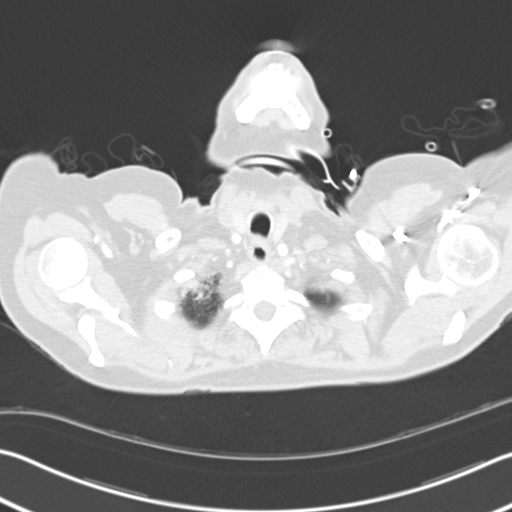
[im 92/98  lung]
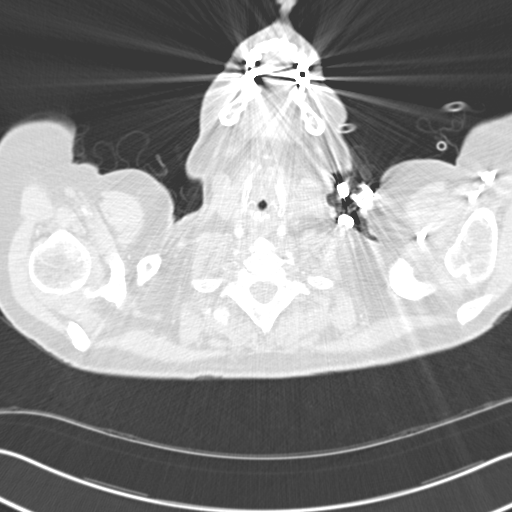

[15 of 30 positions shown; findings below may reference images not displayed]

FINDINGS: Tracheostomy tube is present. The pulmonary arteries opacify
without a filling defect to suggest pulmonary embolism. The thoracic aorta
is normal in caliber without dissection. Trace pericardial fluid is seen.
Small, bilateral pleural effusions are present. Bilateral lower lobe air
bronchograms suggest minimal infiltrate versus compressive atelectasis. No
mediastinal mass or adenopathy is evident. There is a smoothly marginated
nodular density in the right upper lobe measuring 4.3 mm on image #27
somewhat laterally. Respiratory motion artifact is present. There does not
appear to be edema or pneumothorax. Some apical fibrosis is seen anteriorly
on the right. No endobronchial lesion is evident. The included upper
abdominal structures show early arterial phase enhancement without other
significant abnormality evident.
IMPRESSION: 1.  No pulmonary embolism.
2.  Small, bilateral pleural effusions with lower lobe areas of
consolidation suggestive of infiltrate versus atelectasis.
3.  Smoothly marginated nodule in the right upper lobe.

## 2013-01-18 IMAGING — CT NM PET TUM IMG RESTAG (PS) SKULL BASE T - THIGH
5 series · 23 of 25 positions shown · non-contrast
Comparison: none

REASON FOR EXAM: Breast CA Restaging
COMMENTS:

PROCEDURE:     PET - PET/CT RESTG BREAST CA  - December 26, 2010 [DATE]
RESULT:     Comparison: CT of the chest 12/10/2010
Radiopharmaceutical: 12.31 mCi F18-FDG, intravenously.
TECHNIQUE: Imaging was performed from the skull base to the mid-thigh using
routine PET/CT acquisition protocol.
Injection site: Left hand
Time of FDG injection: 3834 hours
Serum glucose: 158 mg/dL
Time of imaging: 9292 hours through 0688 hours

[Series 3: ct wb 3.0 b30f · axial · 3.0mm · 0.98mm/px · z∈[-1428,-560]mm · 10 of 435 slices shown]
[im 1/435]
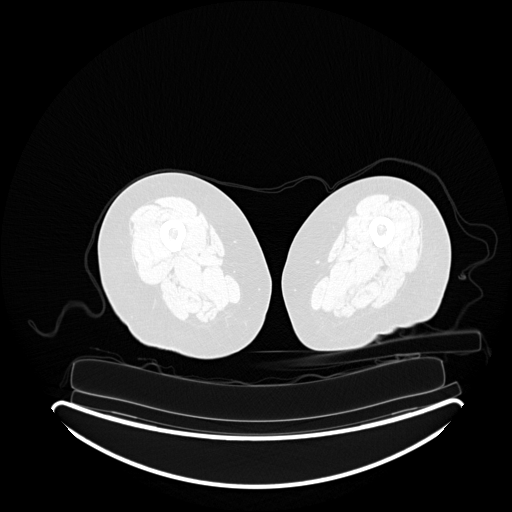
[im 44/435]
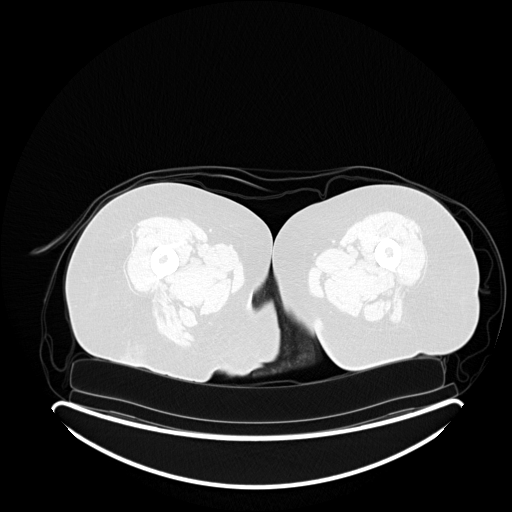
[im 87/435]
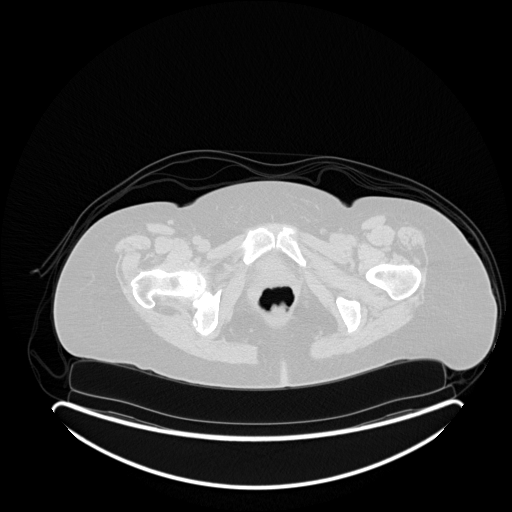
[im 131/435]
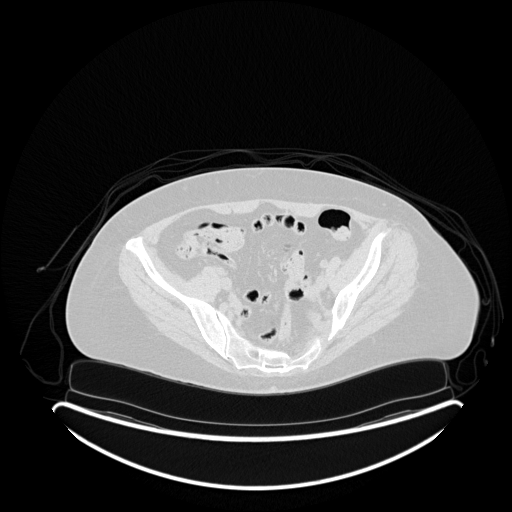
[im 174/435]
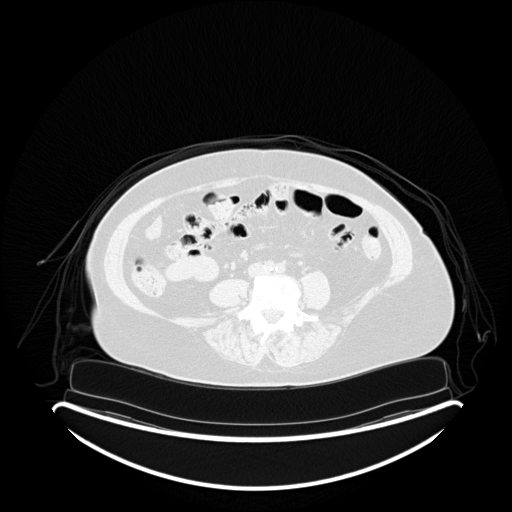
[im 218/435]
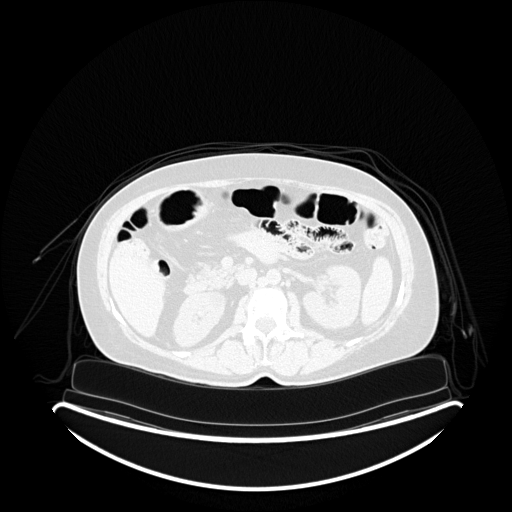
[im 304/435]
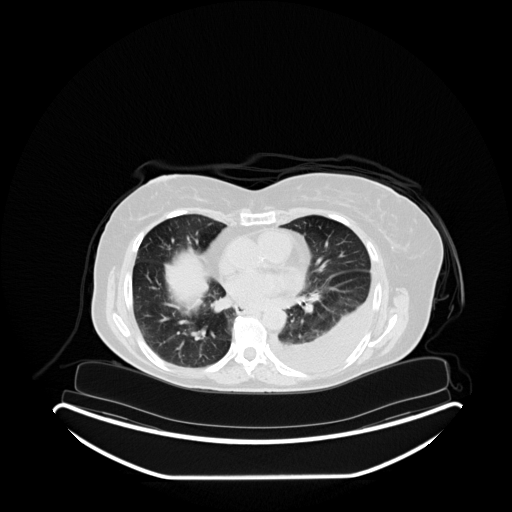
[im 348/435]
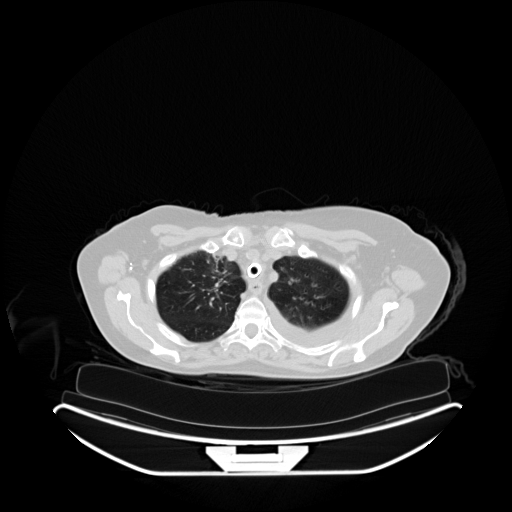
[im 391/435]
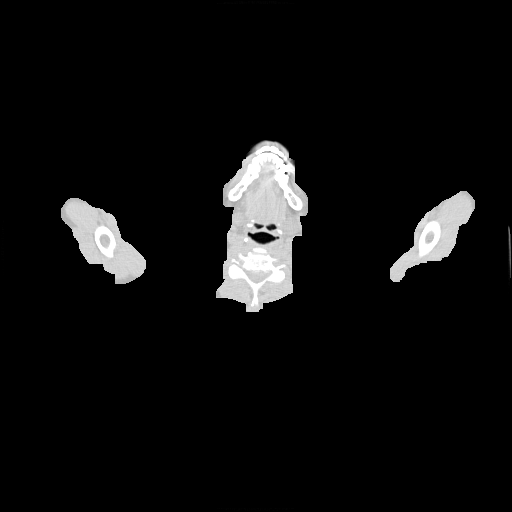
[im 435/435]
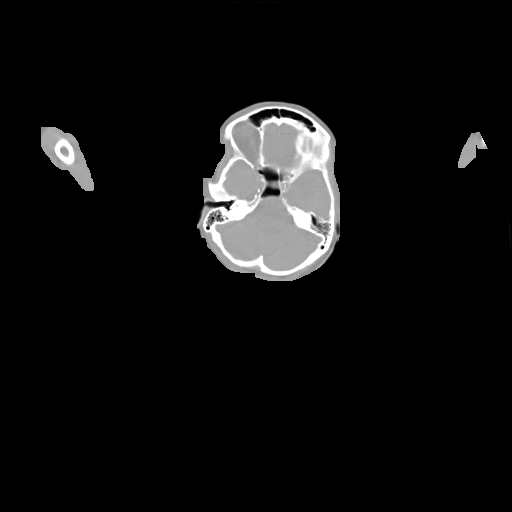

[Series 102: pet wb · axial · 5.0mm · 4.07mm/px · z∈[-1428,-560]mm · 7 of 290 slices shown]
[im 1/290]
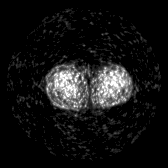
[im 49/290]
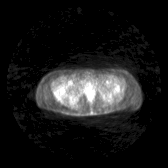
[im 97/290]
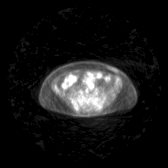
[im 145/290]
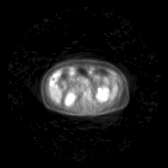
[im 193/290]
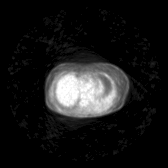
[im 241/290]
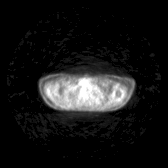
[im 290/290]
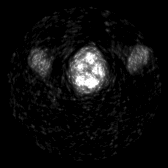

[Series 803: pet axial · 3 of 168 slices shown]
[im 56/168]
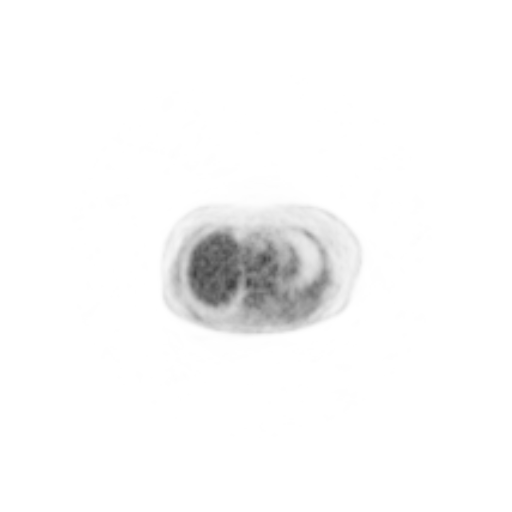
[im 112/168]
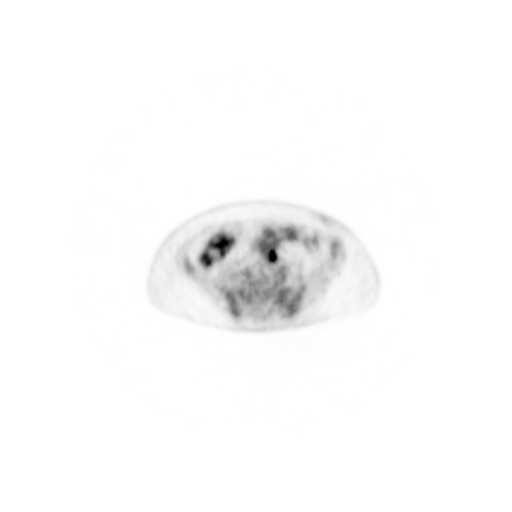
[im 168/168]
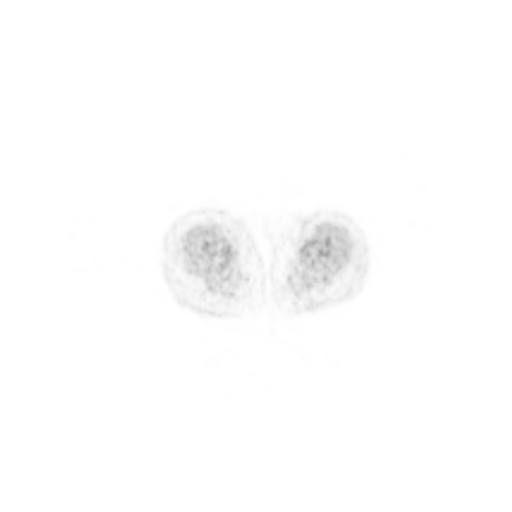

[Series 804: pet coronal · 1 of 51 slices shown]
[im 1/51]
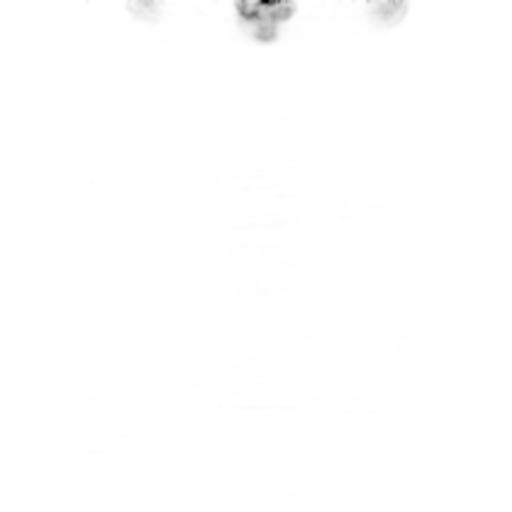

[Series 805: pet sagittal · 2 of 74 slices shown]
[im 1/74]
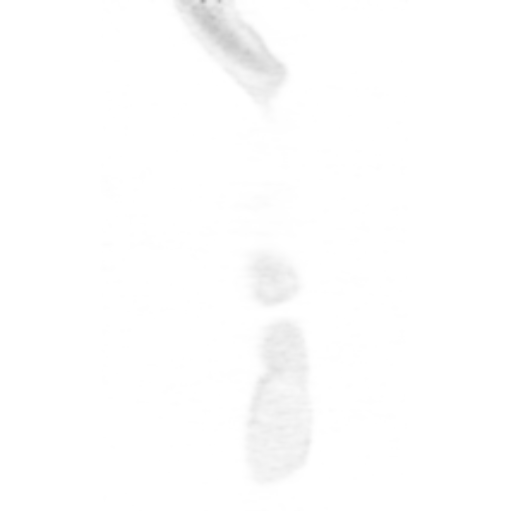
[im 74/74]
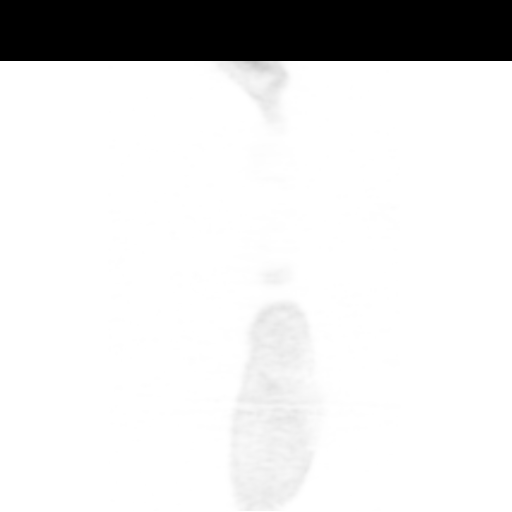

[23 of 25 positions shown; findings below may reference images not displayed]

FINDINGS: No hypermetabolic lymph nodes identified in the neck. A tracheostomy is
present. Mild hypermetabolic activity in the region of the distal
tracheostomy tube is likely inflammatory.

No hypermetabolic mediastinal, hilar, or axillary lymphadenopathy. Focal
tissue density and mild hypermetabolic activity in the right breast may be
secondary to postoperative changes. There is a small left pleural effusion.
There is only low level associated metabolic activity. The left pleural
effusion is slightly increased from prior. The right pleural effusion has
resolved. Interlobular septal thickening in the anterior right upper lobe
likely secondary to post radiation changes. There is a 4 monitor nodule in
the right upper lobe. There is a 3 mm nodule in the superior right lower
lobe. There is a 3 mm nodule in the right lower lobe.

No abnormal foci of hypermetabolic activity identified in the abdomen or
pelvis. There is physiologic radiotracer activity in the urinary system and
bowel.

There is mild hypermetabolic activity at the sternomanubrial joint, which is
likely degenerative.
IMPRESSION: 1. No evidence of FDG avid metastatic disease.
2. Focal soft tissue thickening and mild hypermetabolic activity in the
superior right breast is likely secondary to postoperative changes.
Residual/recurrent malignancy would be difficult to exclude.
3. Indeterminate pulmonary nodules which are 4 mm or less. These are below
the resolution of PET. These will merit continued followup with CT to ensure
stability.
4. Small left pleural effusion which is slightly increased from prior. This
is nonspecific.

## 2013-01-20 IMAGING — CT CT ABD-PELV W/ CM
1 of 3 series · 14 of 32 positions shown, 19 images · IV contrast (isovue)
Comparison: 12/10/2010

REASON FOR EXAM: staging Breast CA, CT Chest 12-10-10
COMMENTS:

PROCEDURE:     CT  - CT ABDOMEN / PELVIS  W  - December 28, 2010  [DATE]
RESULT:     History: Breast cancer
TECHNIQUE: Multiple axial images of the abdomen and pelvis were performed
from the lung bases to the pubic symphysis, with p.o. contrast and with 80
ml of Isovue 370 intravenous contrast.

[Series 2: 3mm soft tissue · axial · 0.70mm/px · z∈[-563,-107]mm · 14 of 170 slices shown, 19 images]
[im 9/170  soft-tissue]
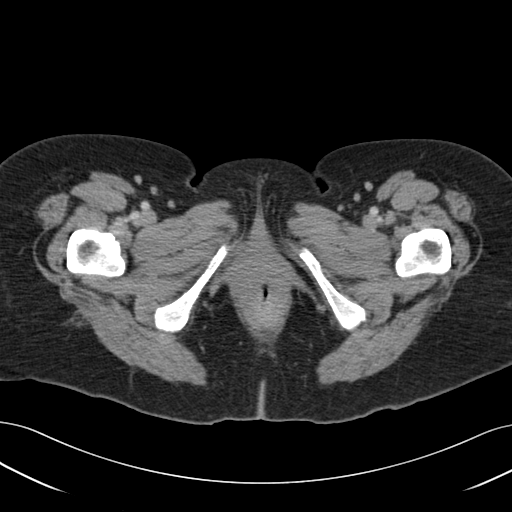
[im 9/170  bone]
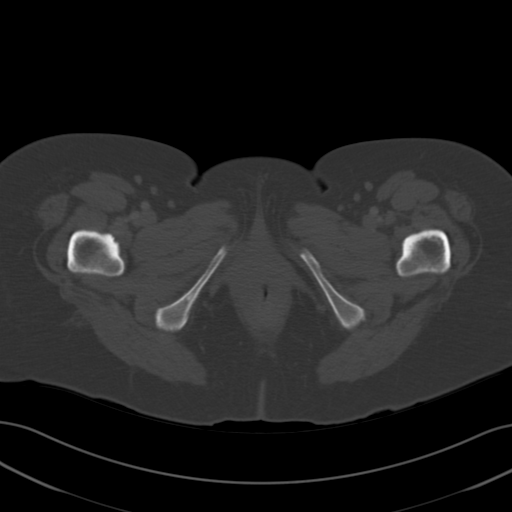
[im 26/170  soft-tissue]
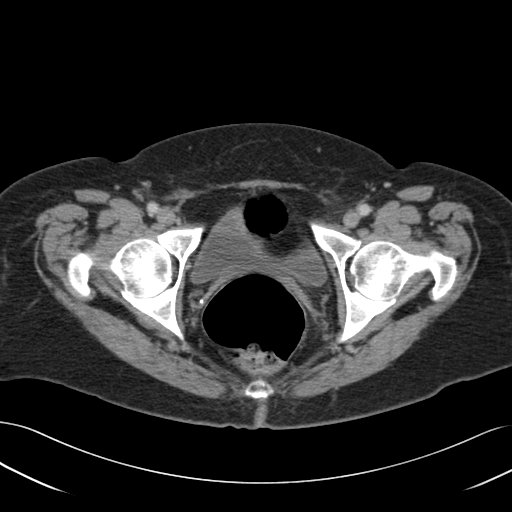
[im 34/170  soft-tissue]
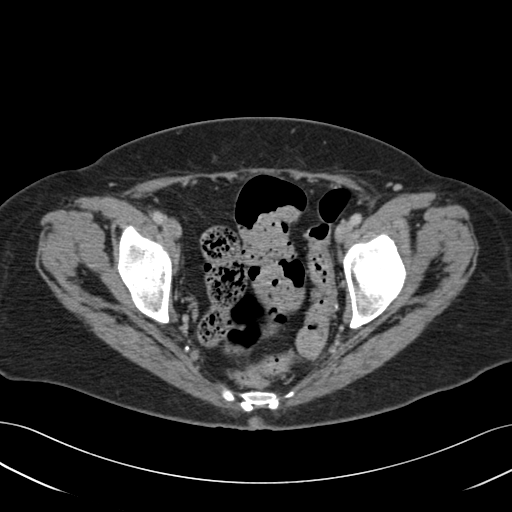
[im 51/170  soft-tissue]
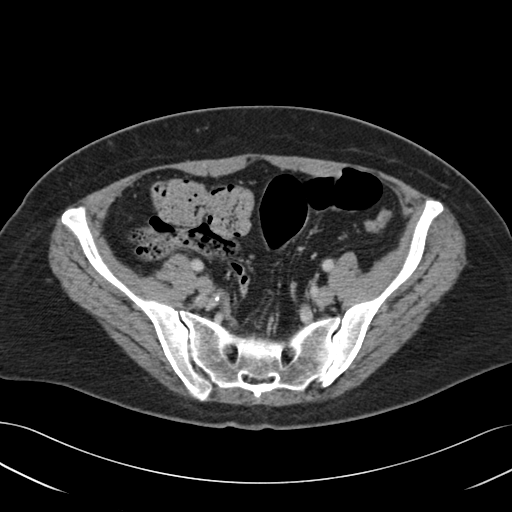
[im 60/170  soft-tissue]
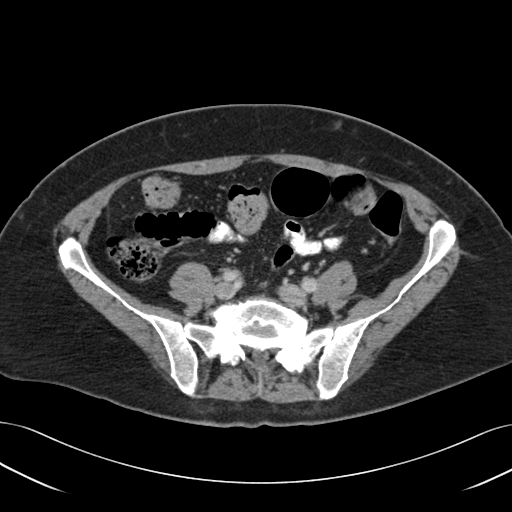
[im 77/170  soft-tissue]
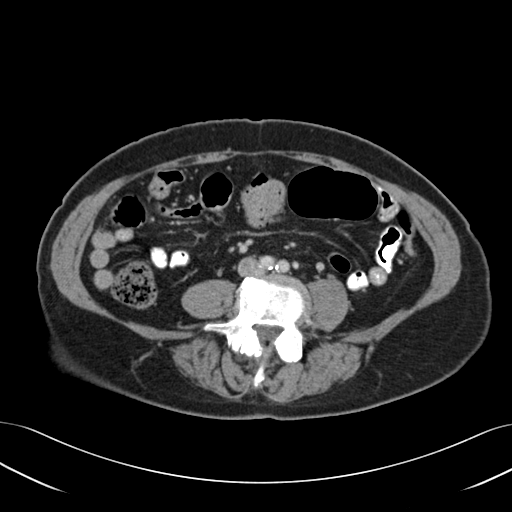
[im 85/170  soft-tissue]
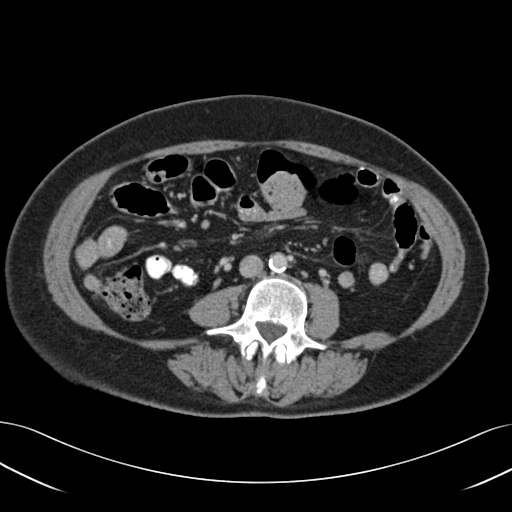
[im 93/170  soft-tissue]
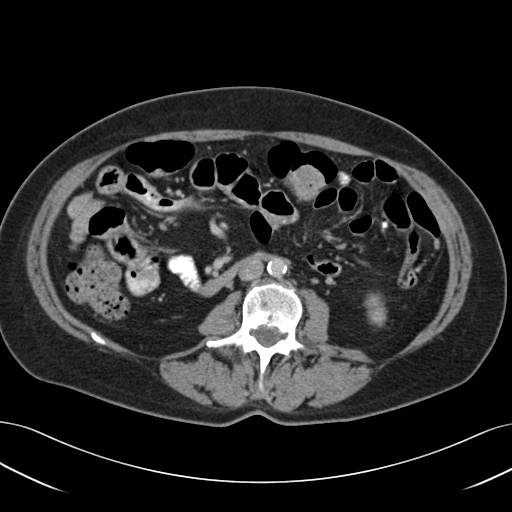
[im 110/170  soft-tissue]
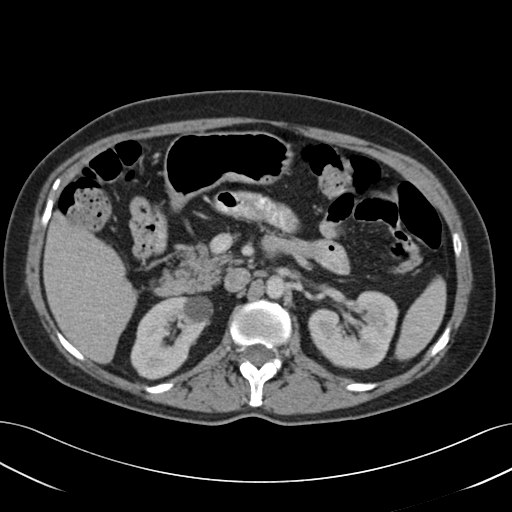
[im 110/170  bone]
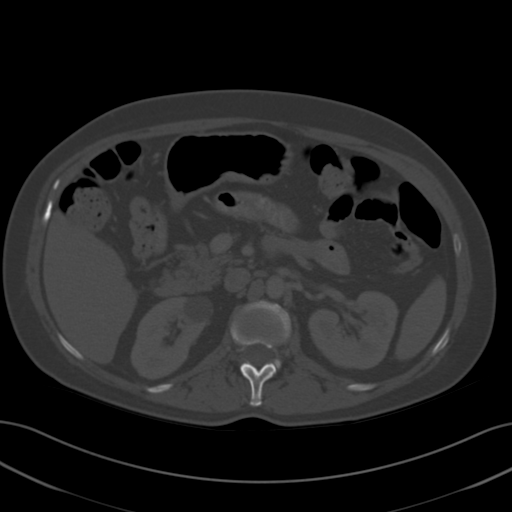
[im 119/170  soft-tissue]
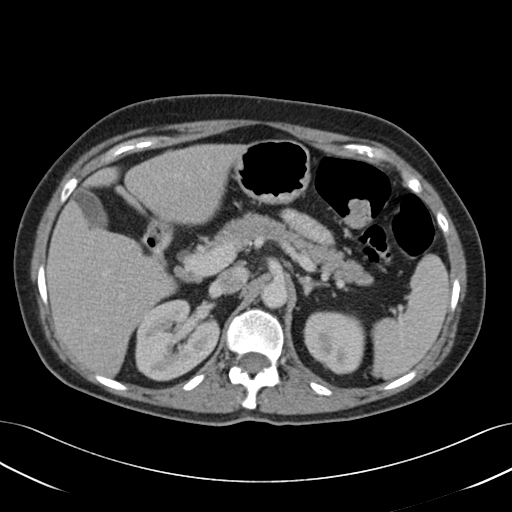
[im 136/170  soft-tissue]
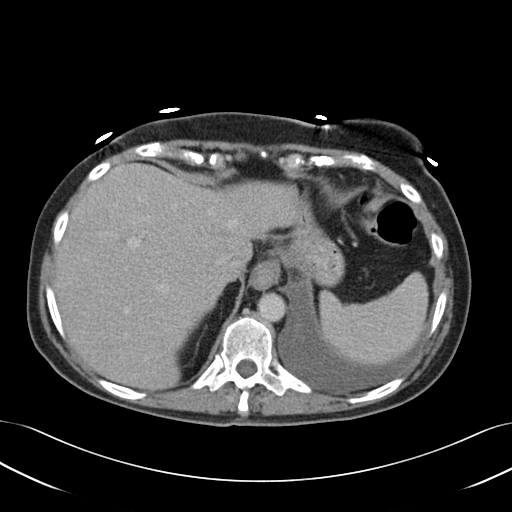
[im 136/170  lung]
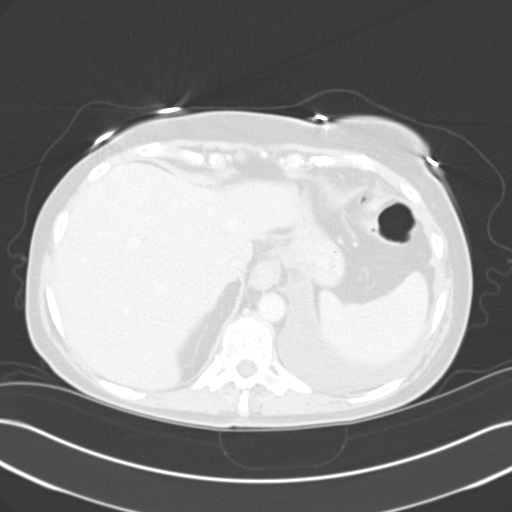
[im 144/170  soft-tissue]
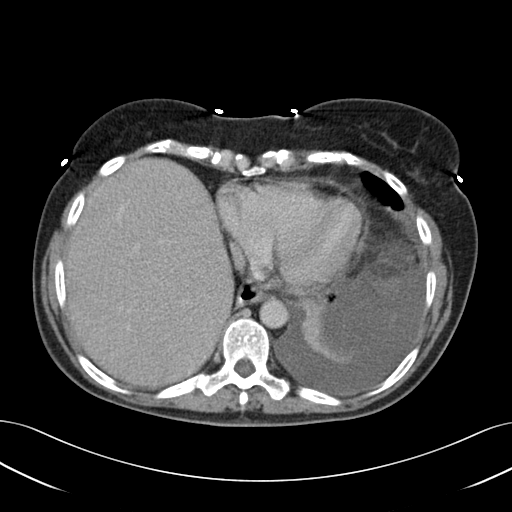
[im 144/170  lung]
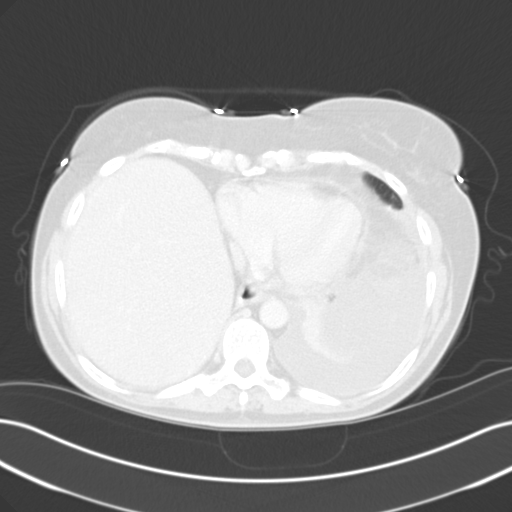
[im 153/170  lung]
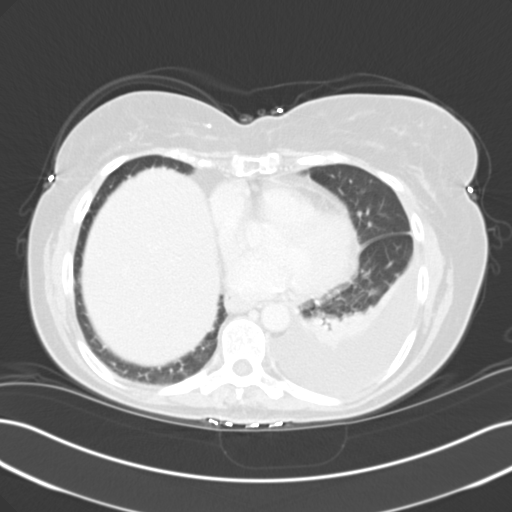
[im 161/170  soft-tissue]
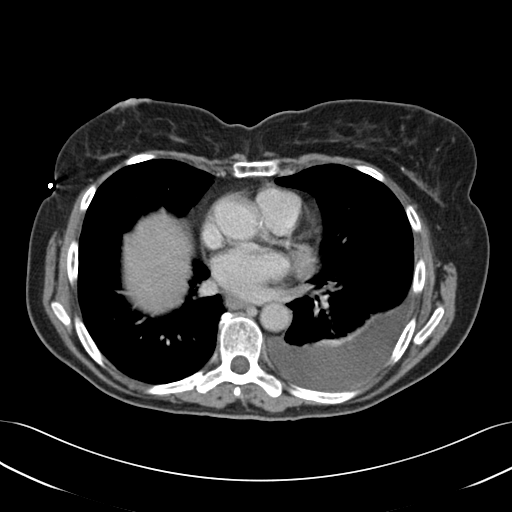
[im 161/170  lung]
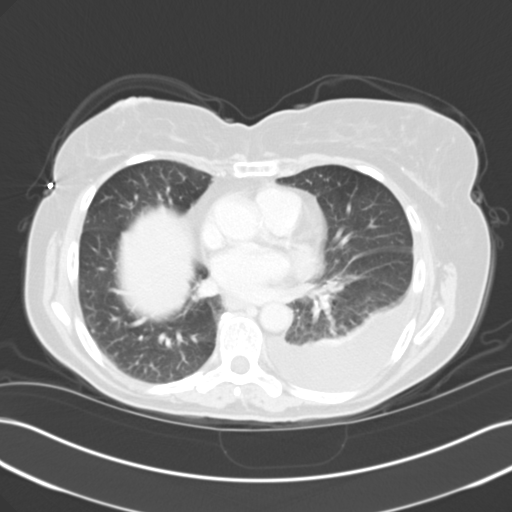

[14 of 32 positions shown; findings below may reference images not displayed]

FINDINGS: There is a small left pleural effusion with compressive atelectasis.

The liver demonstrates no focal abnormality. There is no intrahepatic or
extrahepatic biliary ductal dilatation. The gallbladder is unremarkable. The
spleen demonstrates no focal abnormality. The adrenal glands and pancreas
are normal. There is a hypodense, fluid attenuating right renal mass most
consistent with a cyst. The left kidney is unremarkable. The bladder is
unremarkable.

The stomach, duodenum, small intestine, and large intestine demonstrate no
contrast extravasation or dilatation.  There is no pneumoperitoneum,
pneumatosis, or portal venous gas. There is no abdominal or pelvic free
fluid. There is no lymphadenopathy.

The abdominal aorta is normal in caliber with atherosclerosis.

The osseous structures are unremarkable.
IMPRESSION: 1. Nonspecific left pleural effusion. The appearance is similar to the prior
examination of 12/10/2010.

2. Interval resolution of the right pleural effusion.

## 2013-02-15 IMAGING — CR DG CHEST 1V PORT
1 series · 1 of 1 positions shown · non-contrast
Comparison: none

REASON FOR EXAM: Shortness of Breath
COMMENTS:

PROCEDURE:     DXR - DXR PORTABLE CHEST SINGLE VIEW  - January 23, 2011  [DATE]
RESULT:     Comparison: 12/07/2010

[view not recorded]
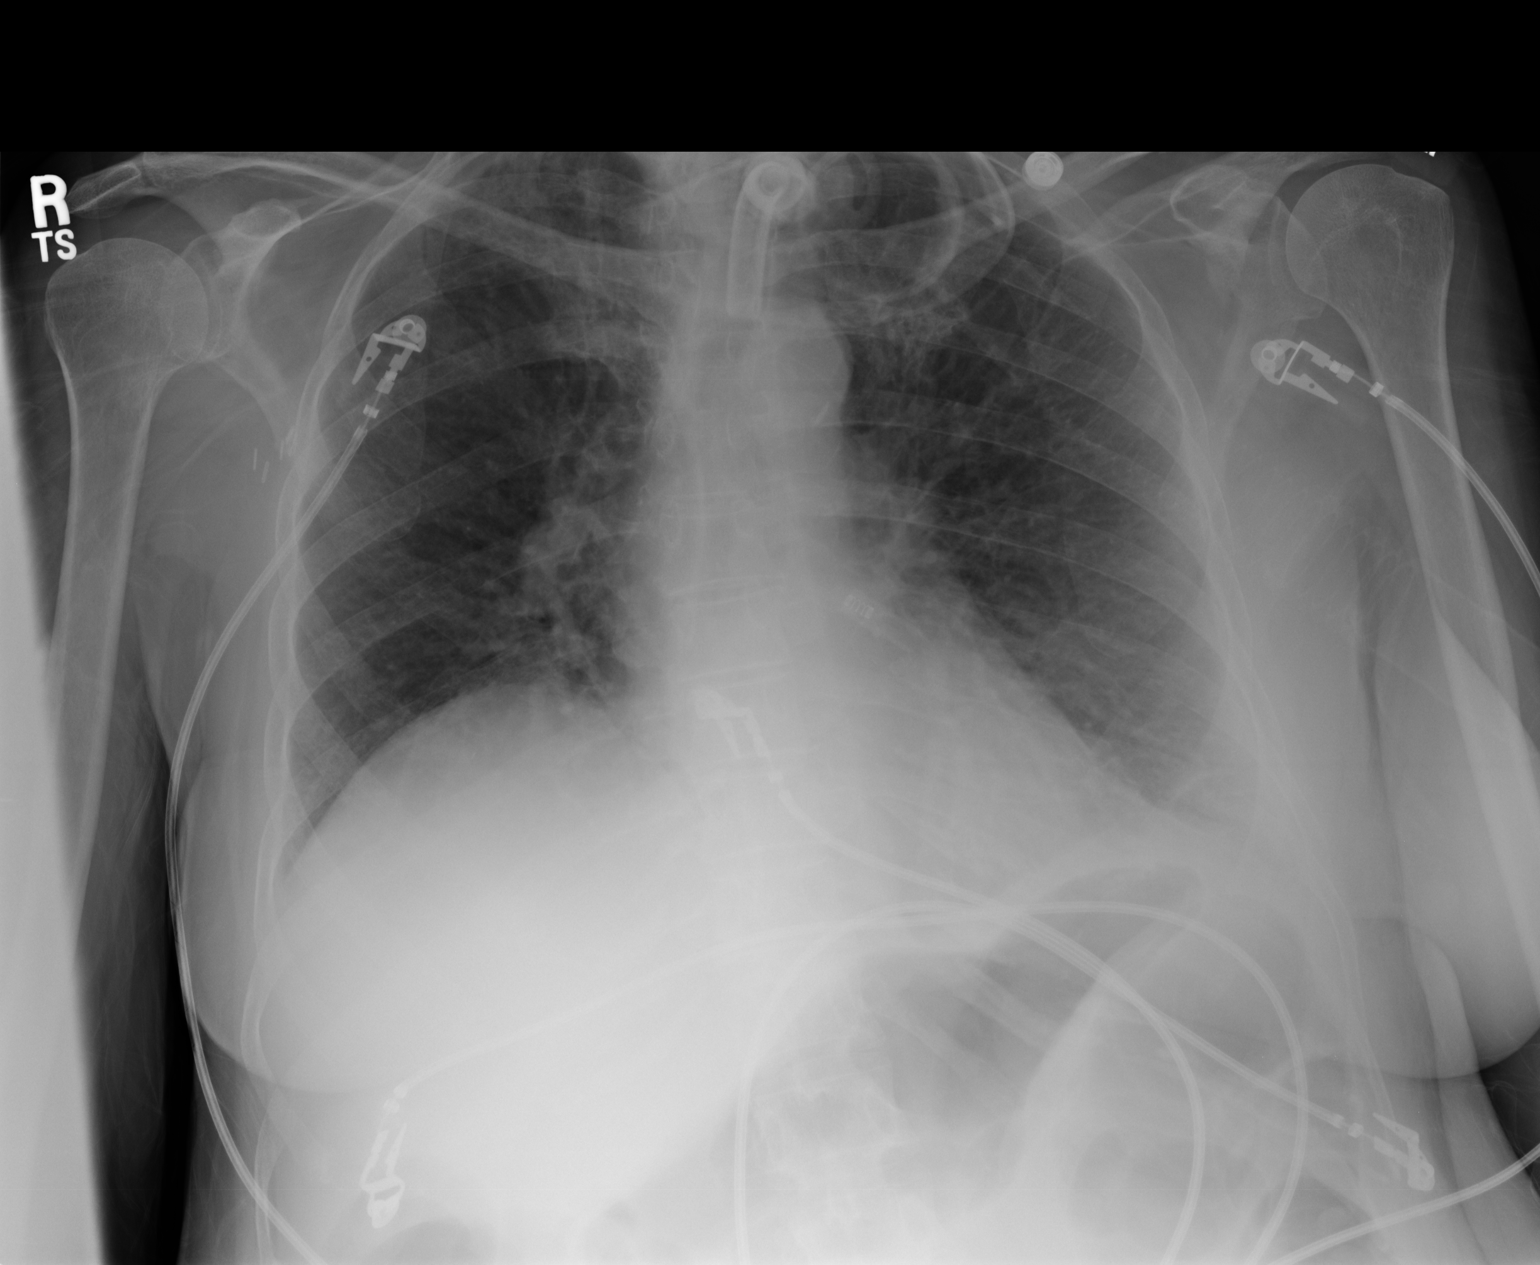

[1 of 1 positions shown; findings below may reference images not displayed]

FINDINGS: Tracheostomy is in place. The lung volumes are somewhat diminished. Heart
and mediastinum are stable. There mild interstitial opacities suggesting
mild interstitial edema. Air seen within what is felt to represent
prominent, but nondilated colon in the left upper quadrant.
IMPRESSION: Findings suggesting mild interstitial pulmonary edema.

## 2013-02-16 IMAGING — CR DG CHEST 2V
1 series · 2 of 2 positions shown · non-contrast
Comparison: none

REASON FOR EXAM: evaluate for pna
COMMENTS:

PROCEDURE:     DXR - DXR CHEST PA (OR AP) AND LATERAL  - January 24, 2011 [DATE]
RESULT:     Comparison: 01/23/2011

[Series 1: w chest pa · 0.14mm/px · 2 of 2 slices shown]
[im 1/2]
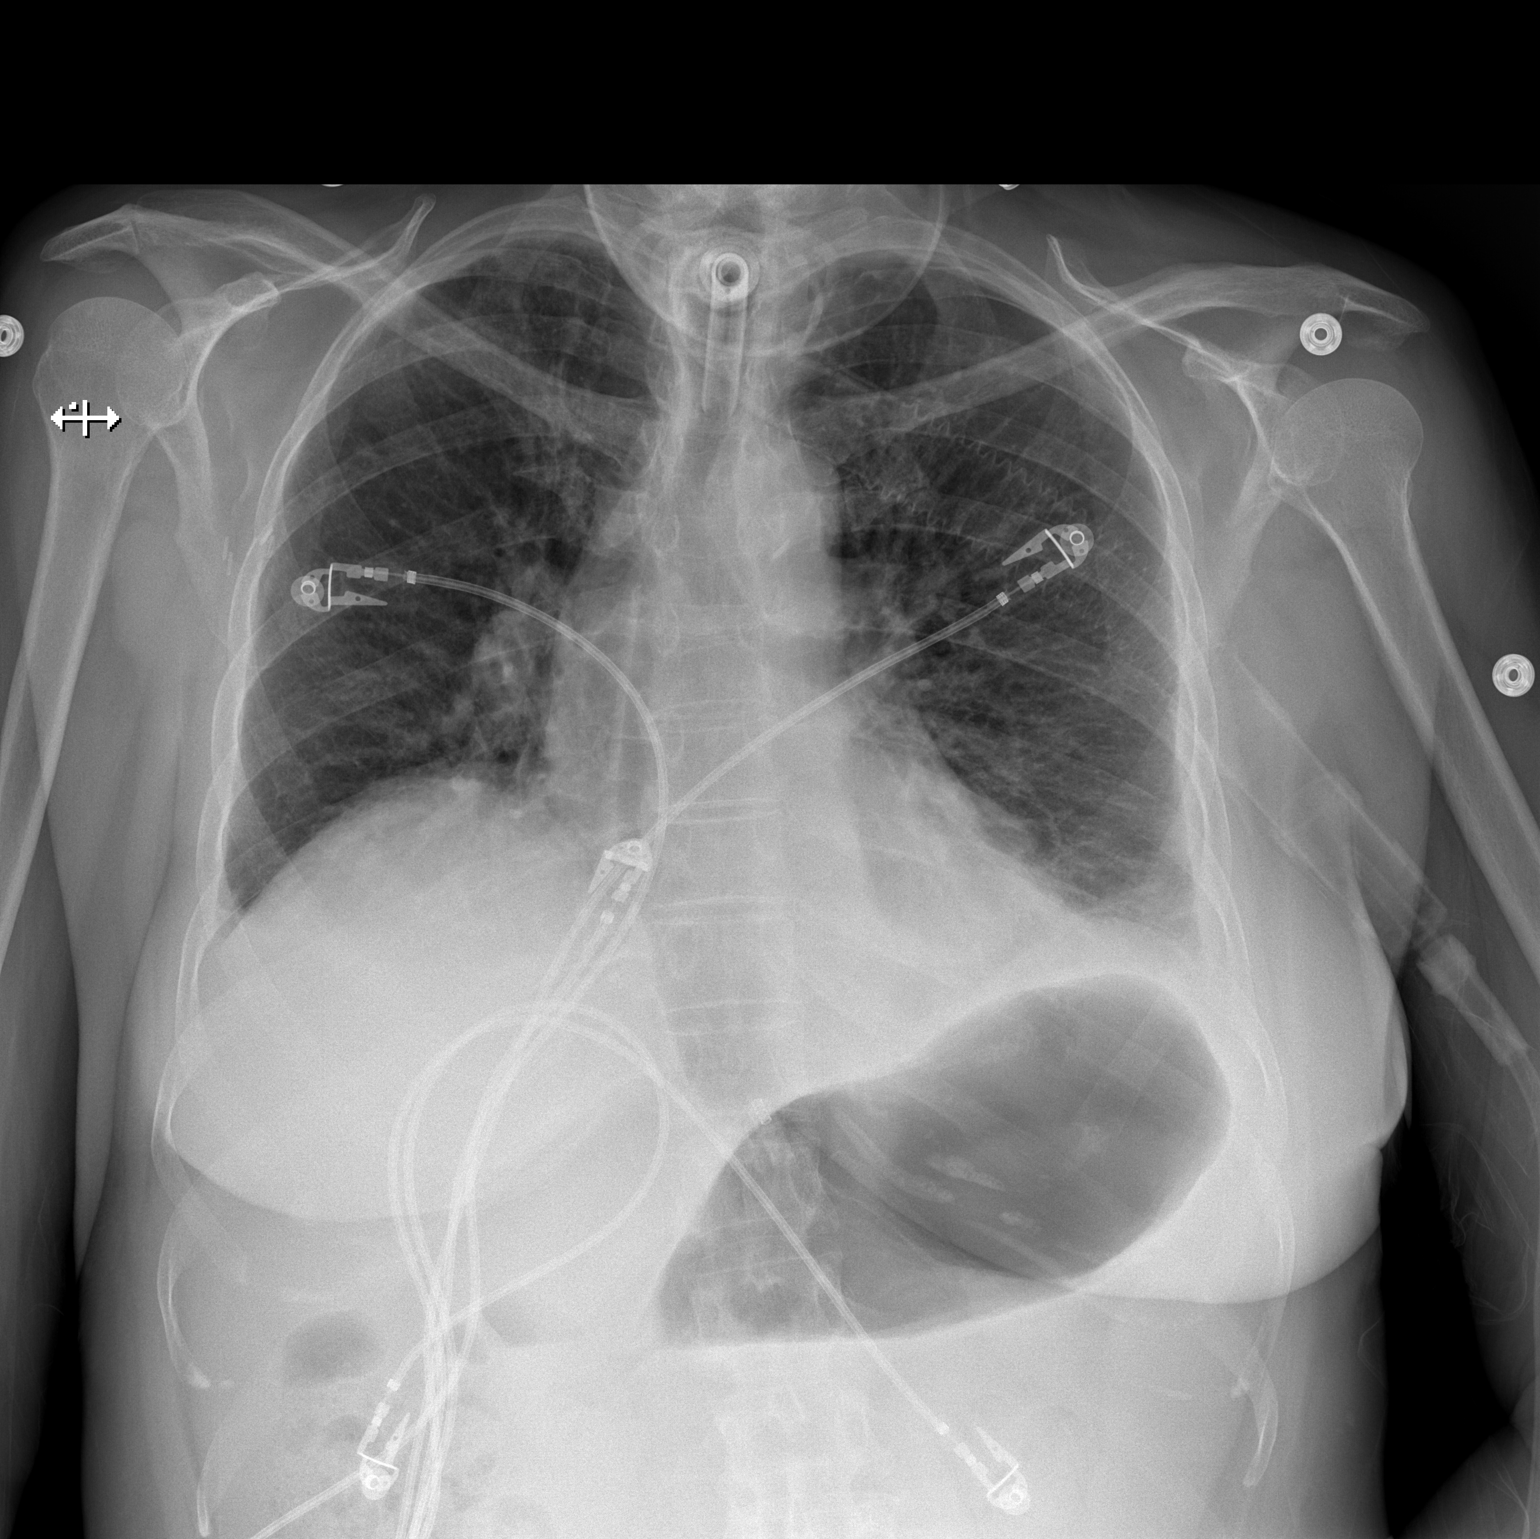
[im 2/2]
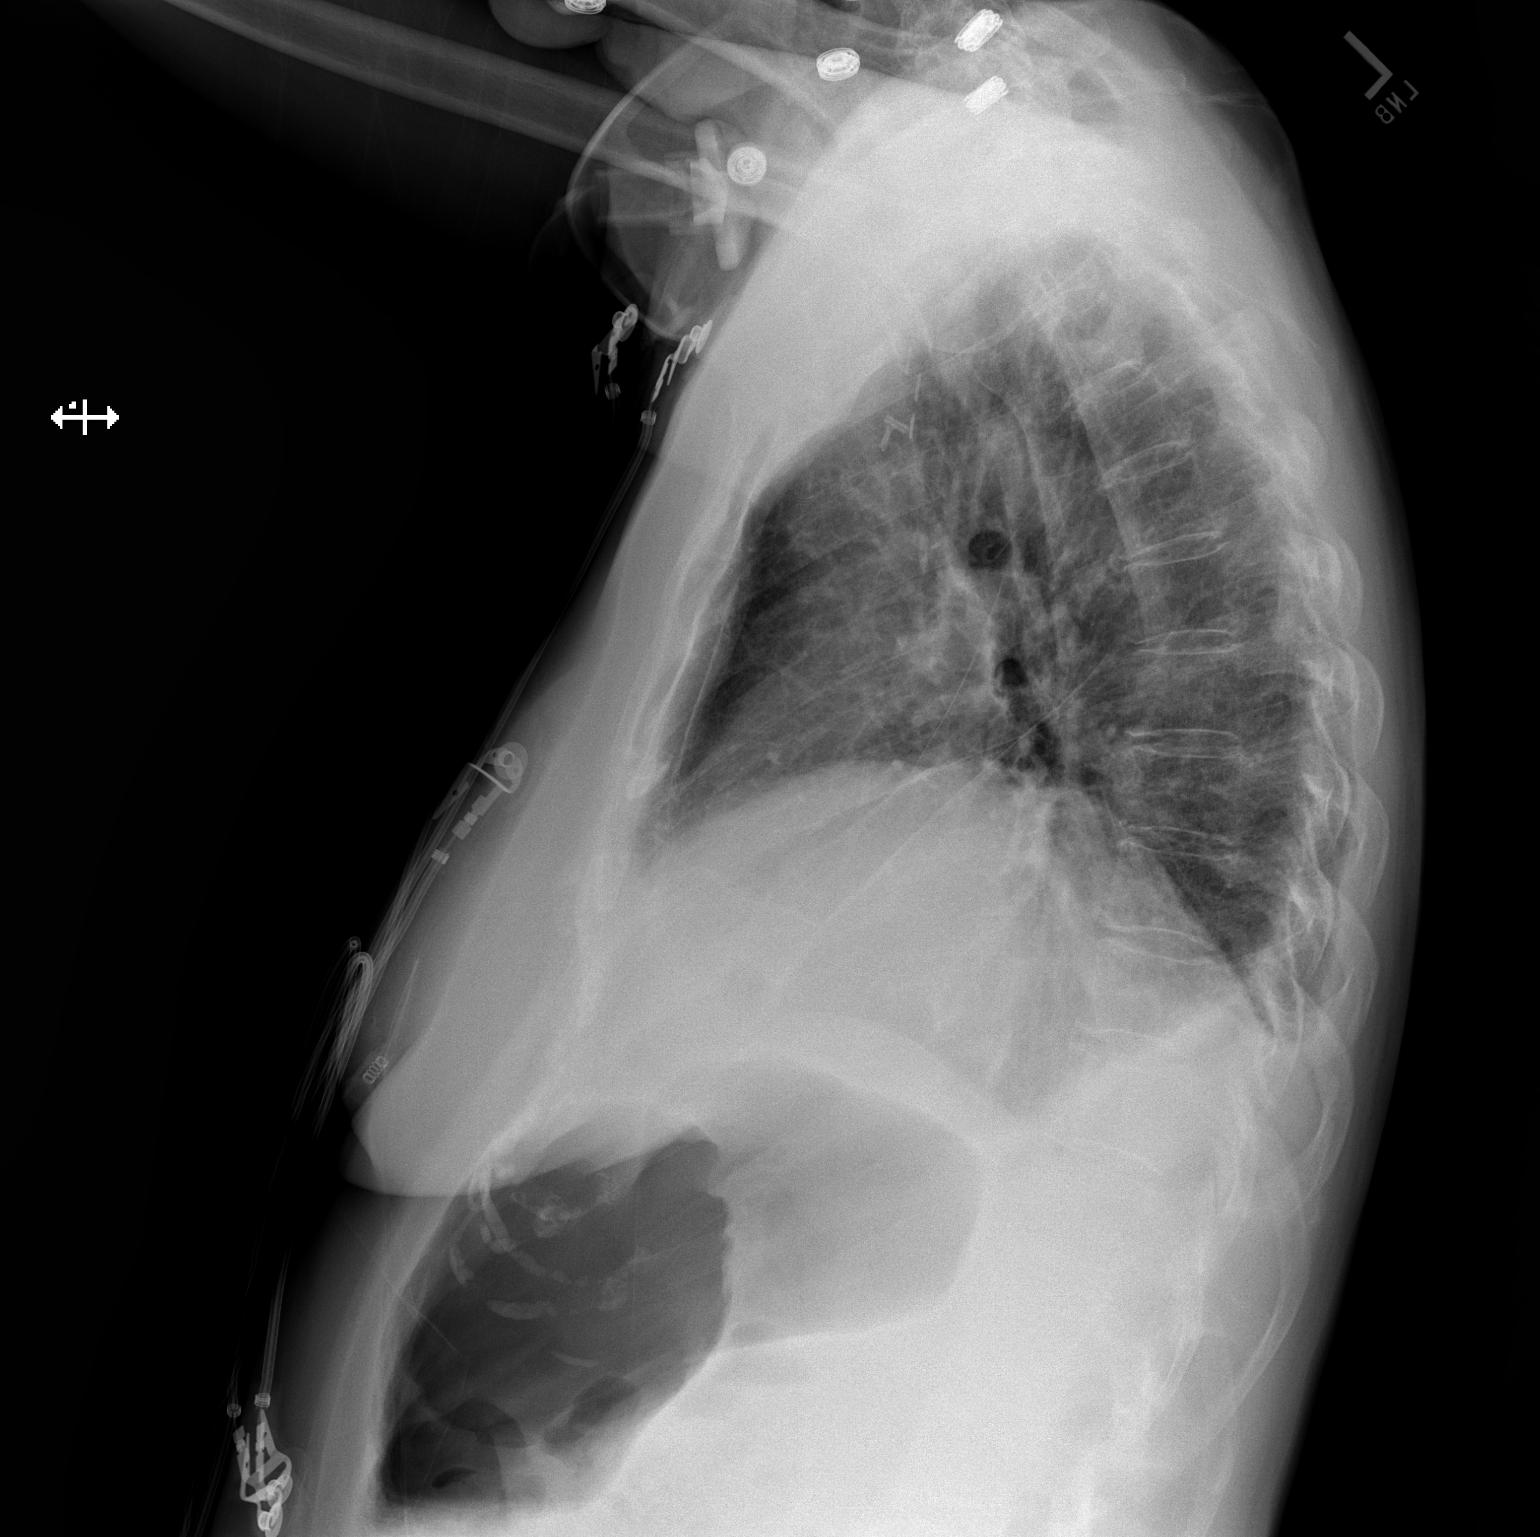

[2 of 2 positions shown; findings below may reference images not displayed]

FINDINGS: Heart and mediastinum are stable. There is elevation of the right
hemidiaphragm, similar to prior. Prominence of the pulmonary interstitium is
similar to prior. A tracheostomy is in place. There is a small left pleural
effusion. There is a trace right pleural effusion. Mild left basilar
opacities may represent atelectasis. Infection is not excluded.
IMPRESSION: 1. Findings suggesting interstitial pulmonary edema, similar to prior.
2. Small left, and trace right, pleural effusions.
3. Mild left basilar opacities may represent atelectasis. Infection is not
excluded.

## 2013-11-28 IMAGING — MG MM CAD DIAGNOSTIC MAMMO
1 series · 6 of 6 positions shown · non-contrast
Comparison: none

REASON FOR EXAM: HX CA RT BRST PARENCHYMAL DENSITY FU AND YRLY
COMMENTS:

[R CC · right · 6 of 6 slices shown]
[im 1/6]
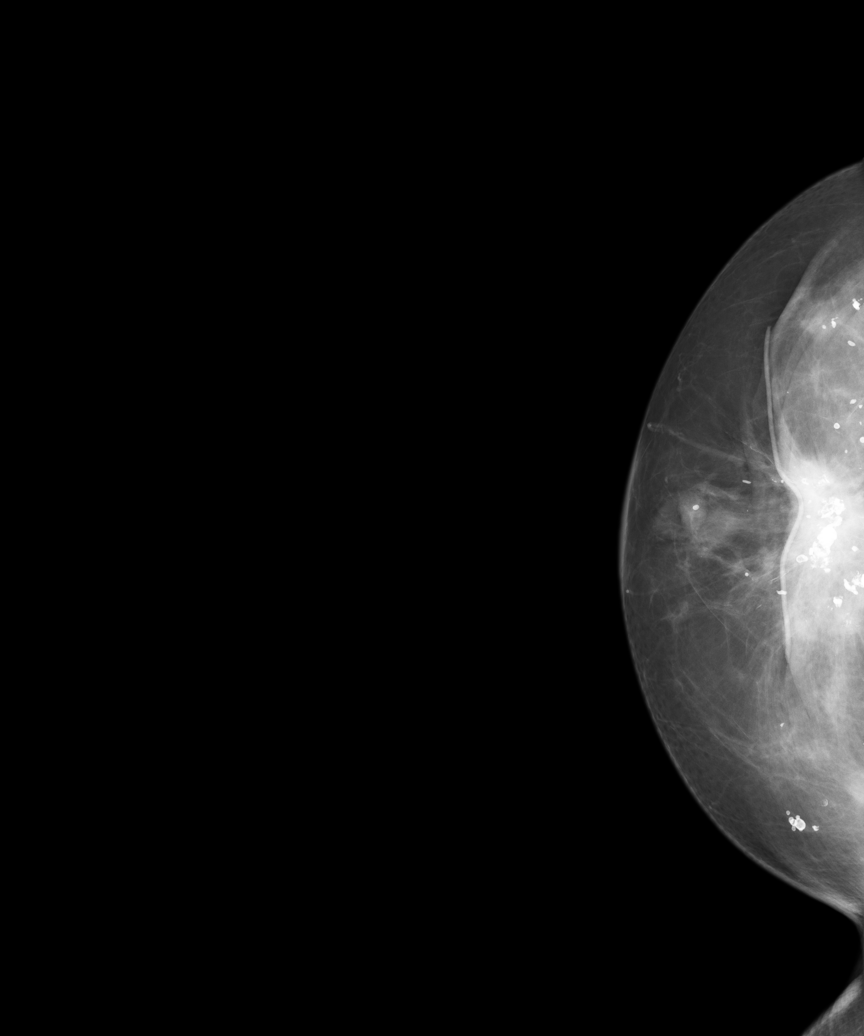
[im 2/6]
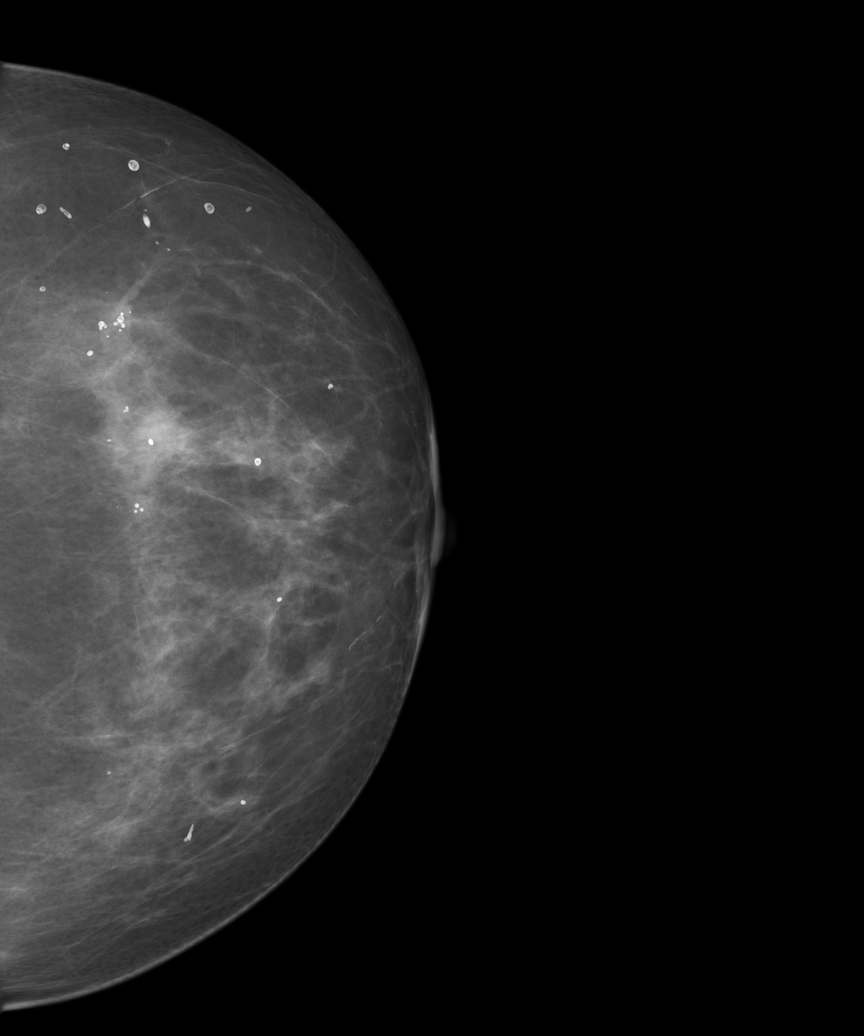
[im 3/6]
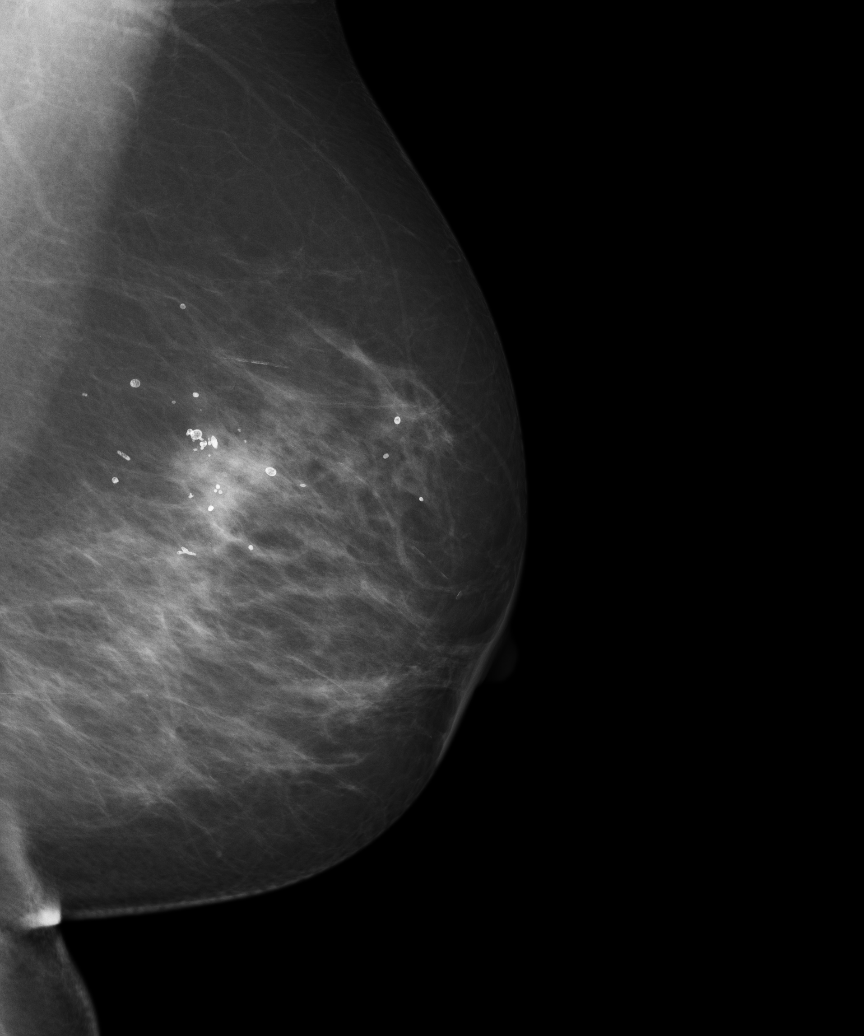
[im 4/6]
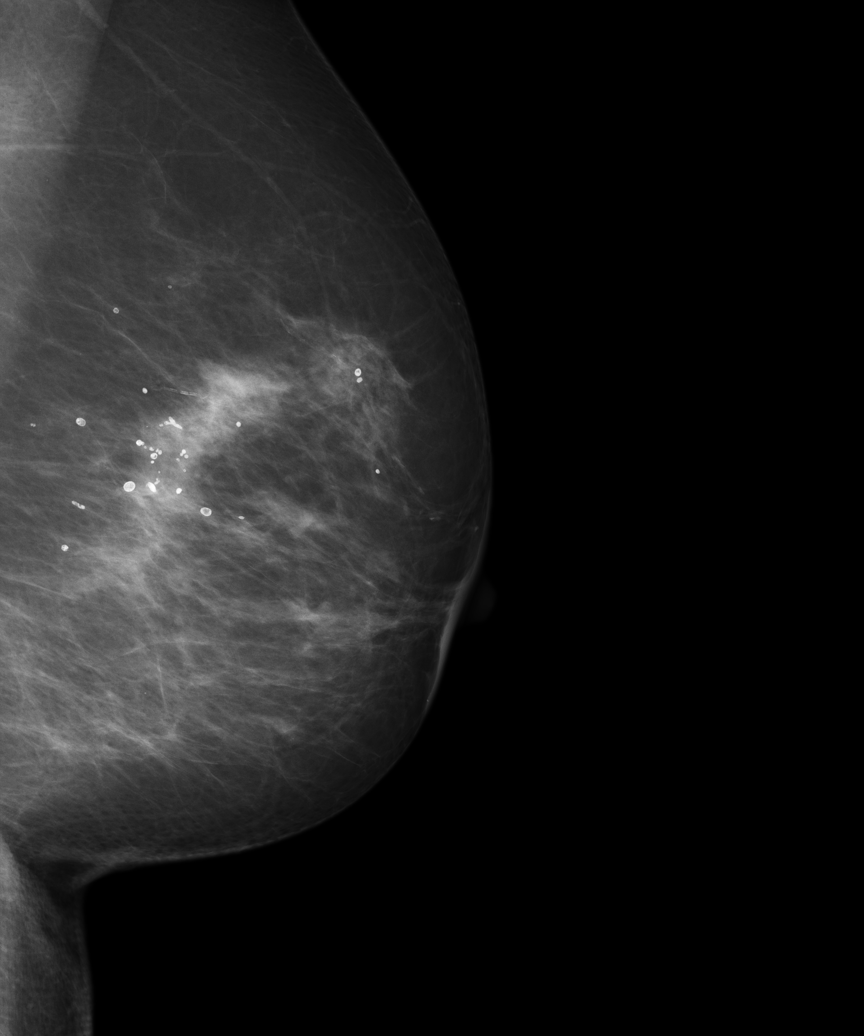
[im 5/6]
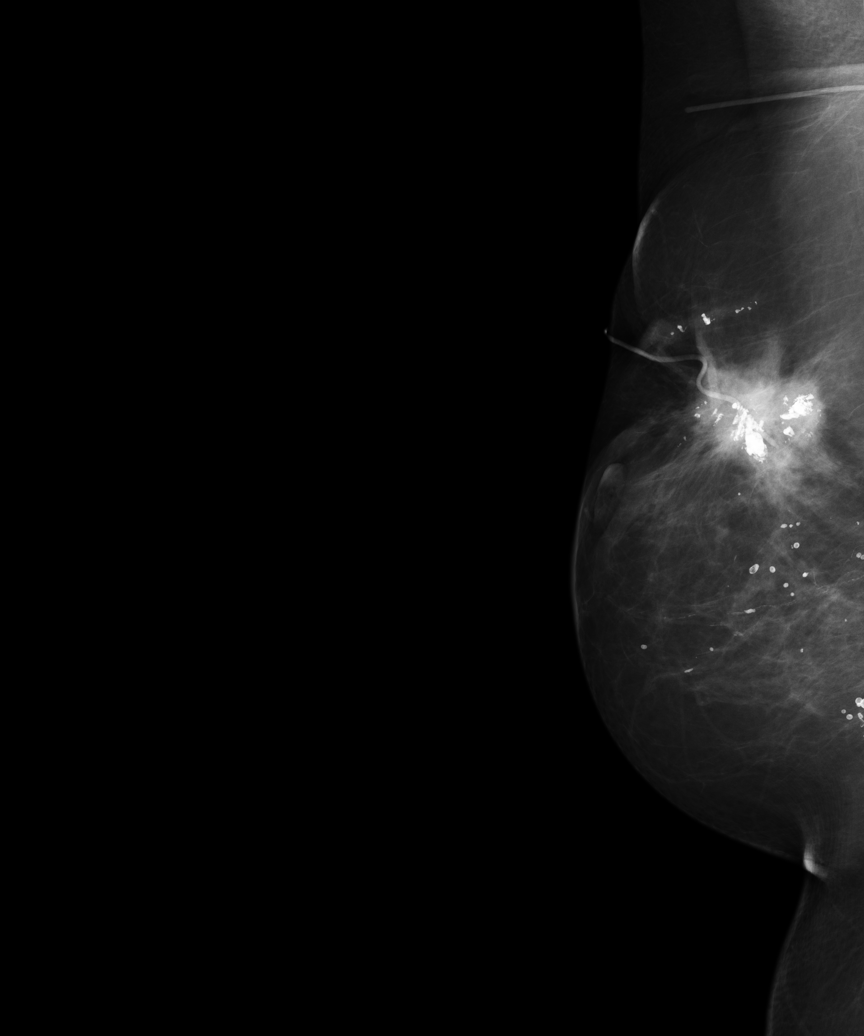
[im 6/6]
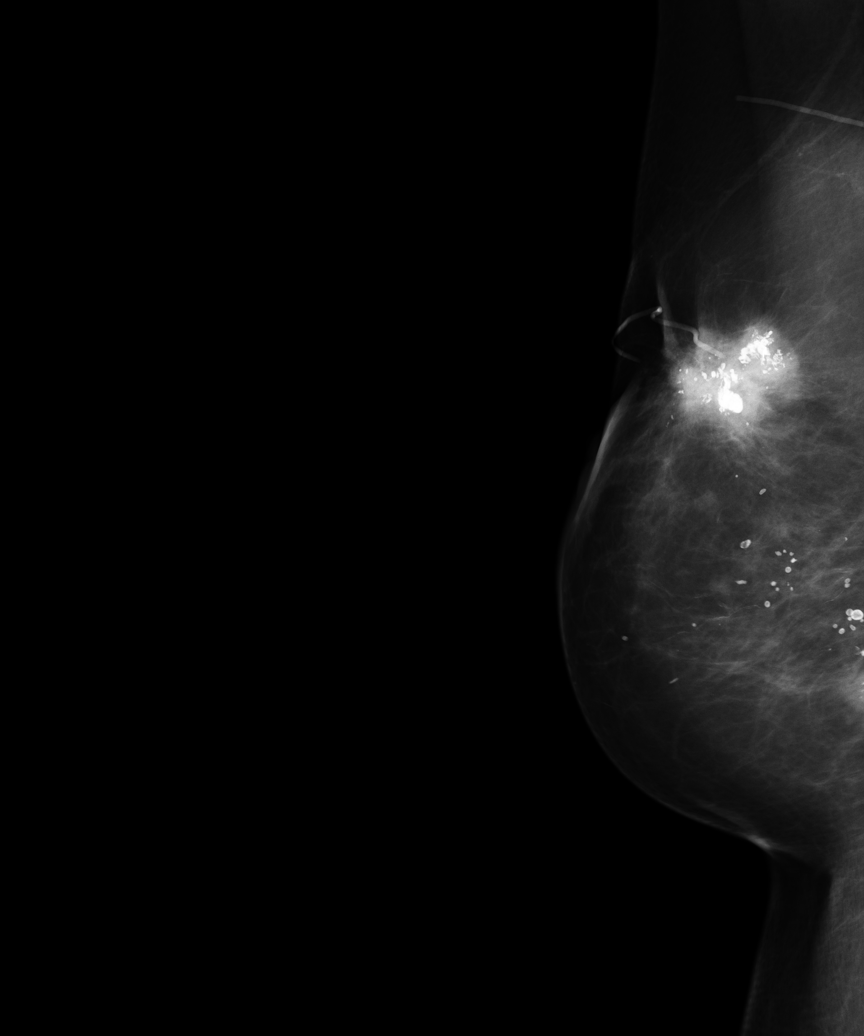

[6 of 6 positions shown; findings below may reference images not displayed]

PROCEDURE:     MAM - MAM DGTL DIAGNOSTIC MAMMO W/CAD  - November 05, 2011  [DATE]

RESULT:     The patient has a history of right breast cancer with previous
right breast lumpectomy and chemotherapy as well as radiation therapy.
Bilateral digital images are compared to previous right breast digital
mammographic images 30 April, 2011, and to bilateral images 20 September, 2010
and 31 May, 2006. There is a moderately dense parenchymal pattern. There is
architectural distortion and increased density as well as coarse
calcification in the upper outer posterior right breast. There is stable
heterogeneous moderate density in the left breast. Bilateral heterogeneous
calcifications are present and appear stable. The increased density in the
area of previous surgery appears to be stable from earlier this year. No new
densities are seen.
IMPRESSION: 1. Presumed postoperative changes in the right breast. No definite malignant
change appreciated elsewhere.

BI-RADS: Category 2 - Benign Finding.

Please continue to encourage annual mammographic follow-up and monthly
breast self exam.

A NEGATIVE MAMMOGRAM REPORT DOES NOT PRECLUDE BIOPSY OR OTHER EVALUATION OF
A CLINICALLY PALPABLE OR OTHERWISE SUSPICIOUS MASS OR LESION. BREAST CANCER
MAY NOT BE DETECTED BY MAMMOGRAPHY IN UP TO 10% OF CASES.

[REDACTED]

## 2013-12-07 ENCOUNTER — Encounter: Payer: Self-pay | Admitting: General Surgery

## 2013-12-07 ENCOUNTER — Ambulatory Visit: Payer: Self-pay | Admitting: General Surgery

## 2013-12-14 ENCOUNTER — Encounter: Payer: Self-pay | Admitting: General Surgery

## 2013-12-14 ENCOUNTER — Ambulatory Visit (INDEPENDENT_AMBULATORY_CARE_PROVIDER_SITE_OTHER): Payer: Medicare Other | Admitting: General Surgery

## 2013-12-14 VITALS — BP 124/76 | HR 74 | Resp 12 | Ht 65.0 in | Wt 168.0 lb

## 2013-12-14 DIAGNOSIS — Z853 Personal history of malignant neoplasm of breast: Secondary | ICD-10-CM

## 2013-12-14 DIAGNOSIS — D213 Benign neoplasm of connective and other soft tissue of thorax: Secondary | ICD-10-CM

## 2013-12-14 DIAGNOSIS — Z86010 Personal history of colonic polyps: Secondary | ICD-10-CM | POA: Insufficient documentation

## 2013-12-14 DIAGNOSIS — L989 Disorder of the skin and subcutaneous tissue, unspecified: Secondary | ICD-10-CM

## 2013-12-14 DIAGNOSIS — R928 Other abnormal and inconclusive findings on diagnostic imaging of breast: Secondary | ICD-10-CM

## 2013-12-14 NOTE — Patient Instructions (Signed)
Colonoscopy  A colonoscopy is an exam to look at the entire large intestine (colon). This exam can help find problems such as tumors, polyps, inflammation, and areas of bleeding. The exam takes about 1 hour.   LET YOUR HEALTH CARE PROVIDER KNOW ABOUT:   · Any allergies you have.  · All medicines you are taking, including vitamins, herbs, eye drops, creams, and over-the-counter medicines.  · Previous problems you or members of your family have had with the use of anesthetics.  · Any blood disorders you have.  · Previous surgeries you have had.  · Medical conditions you have.  RISKS AND COMPLICATIONS   Generally, this is a safe procedure. However, as with any procedure, complications can occur. Possible complications include:  · Bleeding.  · Tearing or rupture of the colon wall.  · Reaction to medicines given during the exam.  · Infection (rare).  BEFORE THE PROCEDURE   · Ask your health care provider about changing or stopping your regular medicines.  · You may be prescribed an oral bowel prep. This involves drinking a large amount of medicated liquid, starting the day before your procedure. The liquid will cause you to have multiple loose stools until your stool is almost clear or light green. This cleans out your colon in preparation for the procedure.  · Do not eat or drink anything else once you have started the bowel prep, unless your health care provider tells you it is safe to do so.  · Arrange for someone to drive you home after the procedure.  PROCEDURE   · You will be given medicine to help you relax (sedative).  · You will lie on your side with your knees bent.  · A long, flexible tube with a light and camera on the end (colonoscope) will be inserted through the rectum and into the colon. The camera sends video back to a computer screen as it moves through the colon. The colonoscope also releases carbon dioxide gas to inflate the colon. This helps your health care provider see the area better.  · During  the exam, your health care provider may take a small tissue sample (biopsy) to be examined under a microscope if any abnormalities are found.  · The exam is finished when the entire colon has been viewed.  AFTER THE PROCEDURE   · Do not drive for 24 hours after the exam.  · You may have a small amount of blood in your stool.  · You may pass moderate amounts of gas and have mild abdominal cramping or bloating. This is caused by the gas used to inflate your colon during the exam.  · Ask when your test results will be ready and how you will get your results. Make sure you get your test results.  Document Released: 02/10/2000 Document Revised: 12/03/2012 Document Reviewed: 10/20/2012  [employer]® Patient Information ©2015 [employer]. This information is not intended to replace advice given to you by your health care provider. Make sure you discuss any questions you have with your health care provider.

## 2013-12-14 NOTE — Progress Notes (Signed)
Patient ID: Angela David, female   DOB: [birth year], 73 y.o.   MRN: [id number]  Chief Complaint  Patient presents with  . Follow-up    1 year mammogram     HPI Angela David is a 73 y.o. female who presents for a breast cancer follow up. The most recent mammogram was done on 12/07/13. The patient is to return on Wednesday 12/16/13 for additional views and a right breast ultrasound. Patient does perform regular self breast checks and gets regular mammograms done. She denies any new issues at this time.    HPI  Past Medical History  Diagnosis Date  . Glaucoma   . Personal history of malignant neoplasm of breast   . High cholesterol   . Sleep apnea 2011  . Cancer 2002    right breast    Past Surgical History  Procedure Laterality Date  . Abdominal hysterectomy    . Hydrocele excision / repair    . Colonoscopy  2009  . Tracheal surgery  2013  . Breast surgery Right 2002    LUMPECTOMY, radiation, chemotherapy  . Breast biopsy  2008    Family History  Problem Relation Age of Onset  . Cancer Father     bone cancer  . Cancer Sister   . Cancer Brother     Social History History  Substance Use Topics  . Smoking status: Never Smoker   . Smokeless tobacco: Never Used  . Alcohol Use: No    Allergies  Allergen Reactions  . Codeine     Per patient "it kept her up all night"  . Eggs Or Egg-Derived Products     respitatory distress   . Shellfish Allergy     respitatory distress    . Sodium Sulfite Other (See Comments)    Current Outpatient Prescriptions  Medication Sig Dispense Refill  . busPIRone (BUSPAR) 15 MG tablet Take 1 tablet by mouth 4 (four) times daily.      . ferrous sulfate 325 (65 FE) MG tablet Take 325 mg by mouth daily.      . folic acid (FOLVITE) 1 MG tablet Take 1 mg by mouth daily.      Marland Kitchen LORazepam (ATIVAN) 0.5 MG tablet Take 0.5 mg by mouth 3 (three) times daily.      Marland Kitchen lovastatin (MEVACOR) 40 MG tablet Take 1 tablet by mouth daily.       . metFORMIN (GLUCOPHAGE) 1000 MG tablet 1 tab by mouth in AM 1.5 tab by mouth in PM      . metoprolol tartrate (LOPRESSOR) 25 MG tablet Take 1 tablet by mouth 2 (two) times daily.      . pantoprazole (PROTONIX) 40 MG tablet Take 40 mg by mouth daily.      Marland Kitchen venlafaxine XR (EFFEXOR-XR) 75 MG 24 hr capsule Take 1 capsule by mouth 4 (four) times daily.       No current facility-administered medications for this visit.    Review of Systems Review of Systems  Constitutional: Negative.   Respiratory: Negative.   Cardiovascular: Negative.     Blood pressure 124/76, pulse 74, resp. rate 12, height 5\' 5"  (1.651 m), weight 168 lb (76.204 kg).  Physical Exam Physical Exam  Constitutional: She is oriented to person, place, and time. She appears well-developed and well-nourished.  Eyes: Conjunctivae are normal. No scleral icterus.  Neck: Neck supple. No thyromegaly present.  Cardiovascular: Normal rate, regular rhythm and normal heart sounds.   No murmur heard. Pulmonary/Chest: Effort  normal and breath sounds normal. Right breast exhibits no inverted nipple, no mass, no nipple discharge, no skin change and no tenderness. Left breast exhibits no inverted nipple, no mass, no nipple discharge, no skin change and no tenderness.  Mild deformity of right lumpectomy site unchanged from the past.   Abdominal: Soft. Normal appearance and bowel sounds are normal. There is no hepatosplenomegaly. There is no tenderness. No hernia.  Lymphadenopathy:    She has no cervical adenopathy.    She has no axillary adenopathy.  Neurological: She is alert and oriented to person, place, and time.  Skin: Skin is warm and dry.  Irritated 3 mm warty skin lesion over the right clavicle. Patient complains of this being source of irritation.     Data Reviewed Mammogram- mild focal asymmetry seen on CC view only of left breast. Additional views pending  Assessment    Stable breast exam. Questionable finding left  breast-await additional views. If ok will arrange 1 yr ff/u. Skin lesion removed today. After alcohol prep, the area was infiltrated with 1 ml 1% xylocaine.  Punch biopsy was used and the lesion excised. cauterised with thermal cautery. Dressed with neosporin and band aid.    Plan     Await additional view of left breast. Colonoscopy is due with a history of colon polyps. Discussed with patient.        Avalon Coppinger G 12/14/2013, 1:00 PM

## 2013-12-16 ENCOUNTER — Ambulatory Visit: Payer: Self-pay | Admitting: General Surgery

## 2013-12-16 ENCOUNTER — Telehealth: Payer: Self-pay | Admitting: *Deleted

## 2013-12-16 ENCOUNTER — Encounter: Payer: Self-pay | Admitting: General Surgery

## 2013-12-16 LAB — PATHOLOGY

## 2013-12-16 NOTE — Telephone Encounter (Signed)
Notified patient as instructed, patient pleased. She needs to know when she needs a colonoscopy done?

## 2013-12-16 NOTE — Telephone Encounter (Signed)
Message copied by Carson Myrtle on Wed Dec 16, 2013  3:34 PM ------      Message from: Christene Lye      Created: Wed Dec 16, 2013  9:03 AM       Please let pt pt know the pathology was normal. ------

## 2013-12-17 ENCOUNTER — Telehealth: Payer: Self-pay | Admitting: *Deleted

## 2013-12-17 MED ORDER — POLYETHYLENE GLYCOL 3350 17 GM/SCOOP PO POWD: ORAL | Status: DC

## 2013-12-17 NOTE — Telephone Encounter (Signed)
Patient agreeable to 6 month office visit follow up. This patient has been placed in the recalls accordingly.

## 2013-12-17 NOTE — Telephone Encounter (Signed)
There was no finding at all on additional view of left breast-so there is no need for another mammogram in 6 mos. I will do an exam here in 6 mos instead if she is ok with it.

## 2013-12-17 NOTE — Telephone Encounter (Signed)
Patient notified as instructed. The patient states she is not comfortable waiting one year for mammogram follow up and wants to know if you will order mammogram in 6 months instead. Patient currently in recalls for one year.   This patient has been scheduled for a colonoscopy on 12-30-13 at [facility]. Patient has paperwork and instructions were reviewed by phone. Miralax prescription has been sent in to patient's pharmacy. She was instructed to hold metformin day of prep and procedure. Patient instructed to call if she has further questions.

## 2013-12-17 NOTE — Telephone Encounter (Signed)
Message copied by Dominga Ferry on Thu Dec 17, 2013  1:14 PM ------      Message from: Christene Lye      Created: Thu Dec 17, 2013 11:28 AM       Additional views of left breast-no abnormality.      Please advise pt. F/U in 27yr with bil screening mammogram. ------

## 2013-12-28 ENCOUNTER — Encounter: Payer: Self-pay | Admitting: General Surgery

## 2013-12-29 ENCOUNTER — Other Ambulatory Visit: Payer: Self-pay | Admitting: General Surgery

## 2013-12-29 DIAGNOSIS — Z86010 Personal history of colonic polyps: Secondary | ICD-10-CM

## 2013-12-30 ENCOUNTER — Ambulatory Visit: Payer: Self-pay | Admitting: General Surgery

## 2013-12-30 DIAGNOSIS — D122 Benign neoplasm of ascending colon: Secondary | ICD-10-CM

## 2013-12-30 DIAGNOSIS — Z86010 Personal history of colonic polyps: Secondary | ICD-10-CM

## 2013-12-30 DIAGNOSIS — D123 Benign neoplasm of transverse colon: Secondary | ICD-10-CM

## 2013-12-31 ENCOUNTER — Encounter: Payer: Self-pay | Admitting: General Surgery

## 2014-01-05 ENCOUNTER — Telehealth: Payer: Self-pay | Admitting: *Deleted

## 2014-01-05 NOTE — Telephone Encounter (Signed)
-----   Message from Christene Lye, MD sent at 01/05/2014  8:05 AM EST ----- Please let pt pt know the pathology was normal.

## 2014-01-05 NOTE — Telephone Encounter (Signed)
Notified patient as instructed, patient pleased. Discussed follow-up appointments, patient agrees  

## 2014-01-07 DIAGNOSIS — M8580 Other specified disorders of bone density and structure, unspecified site: Secondary | ICD-10-CM | POA: Insufficient documentation

## 2014-01-07 DIAGNOSIS — M810 Age-related osteoporosis without current pathological fracture: Secondary | ICD-10-CM

## 2014-01-08 IMAGING — US US EXTREM LOW VENOUS*L*
1 series · 14 of 22 positions shown · non-contrast
Comparison: none

REASON FOR EXAM: syncope, swelling of L leg, eval for DVT
COMMENTS:

[Series 1: us extrem low venous*left* · 0.08mm/px · 14 of 22 slices shown]
[im 1/22]
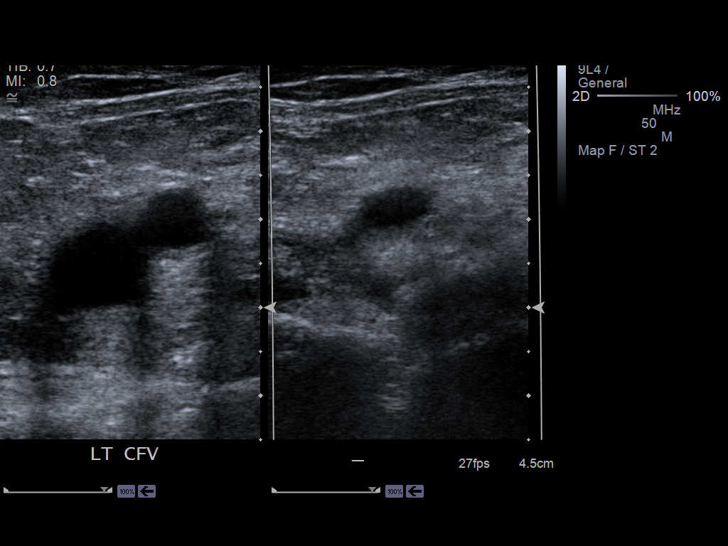
[im 3/22]
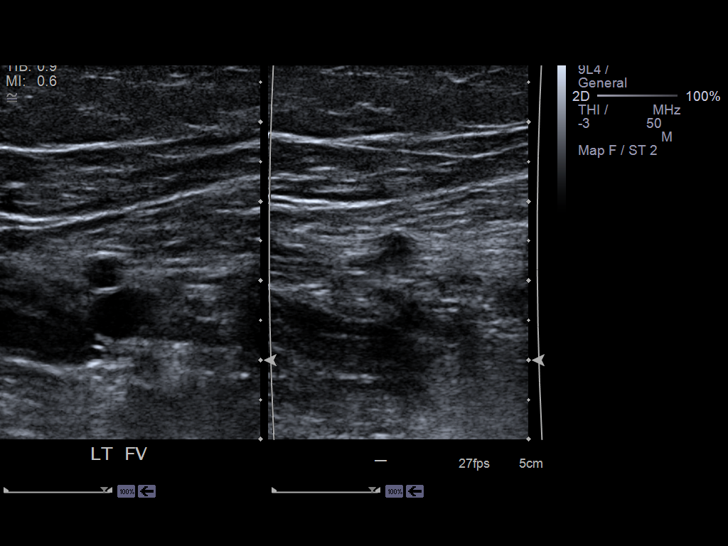
[im 4/22]
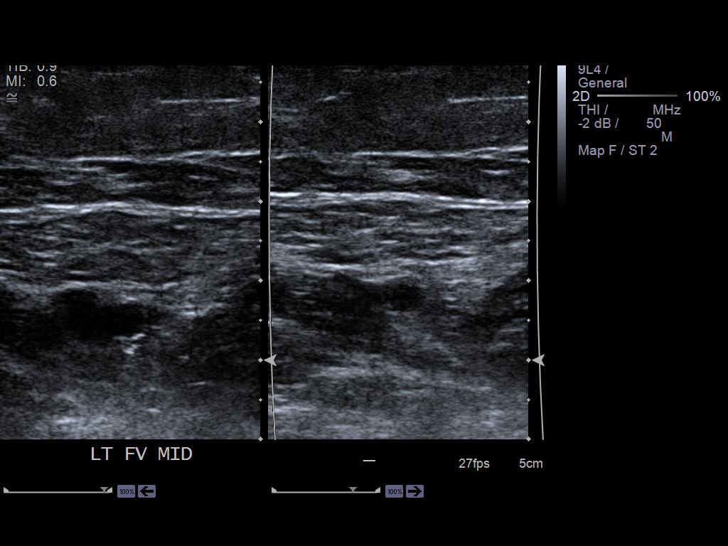
[im 6/22]
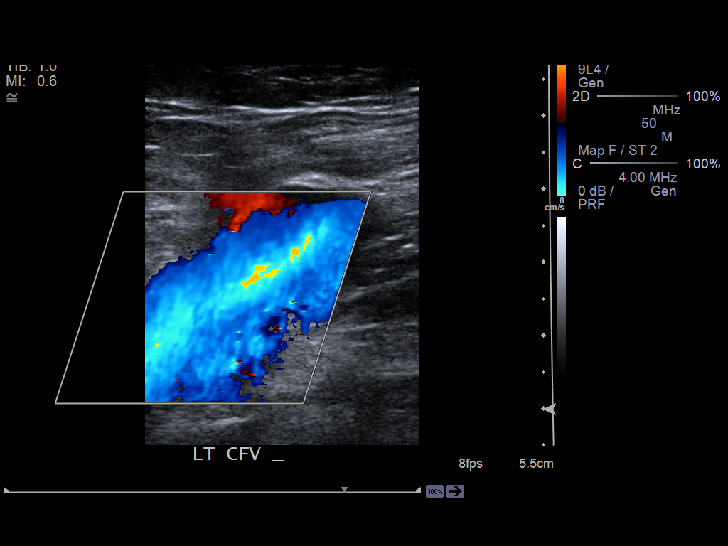
[im 8/22]
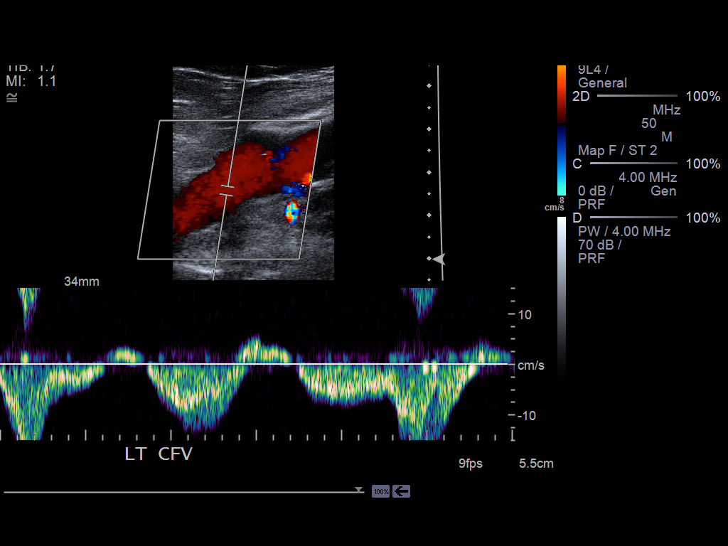
[im 9/22]
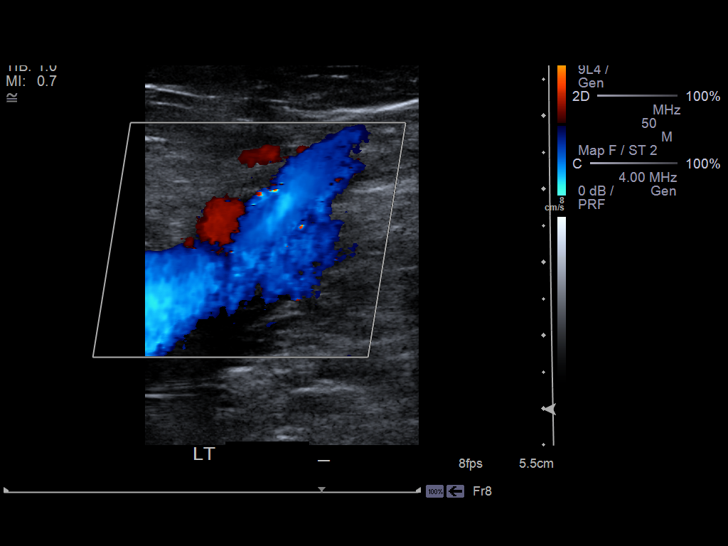
[im 11/22]
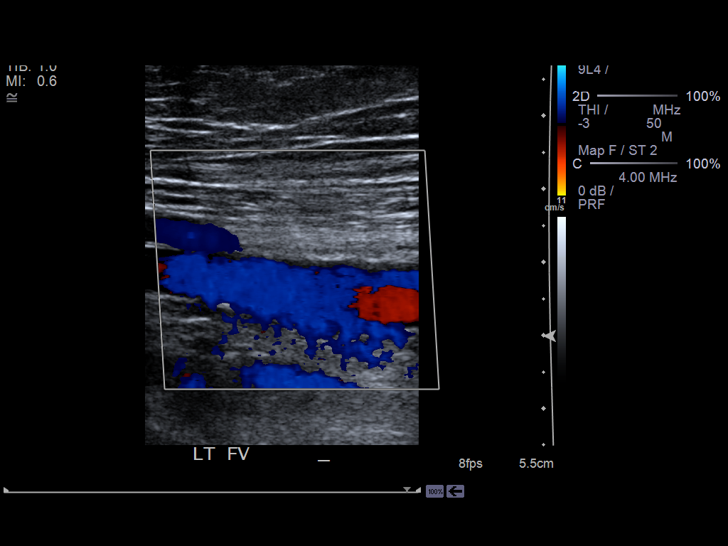
[im 12/22]
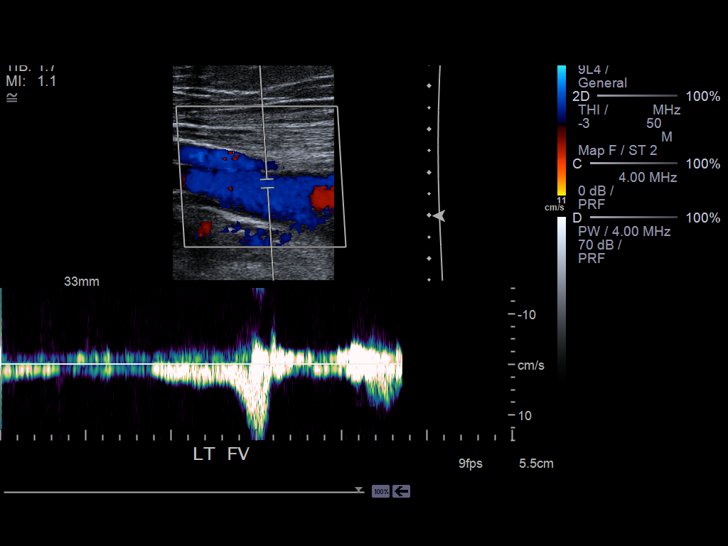
[im 14/22]
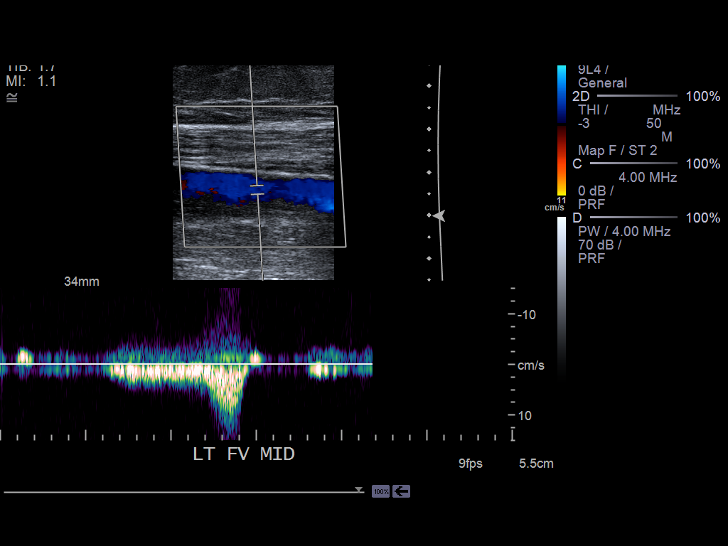
[im 15/22]
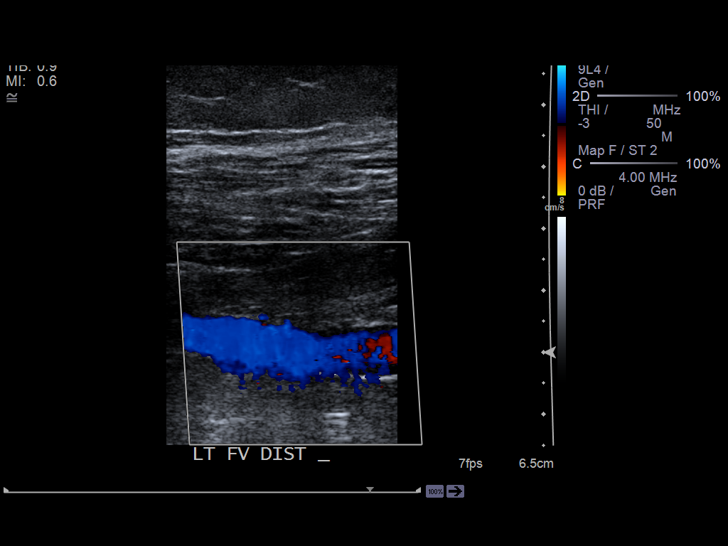
[im 17/22]
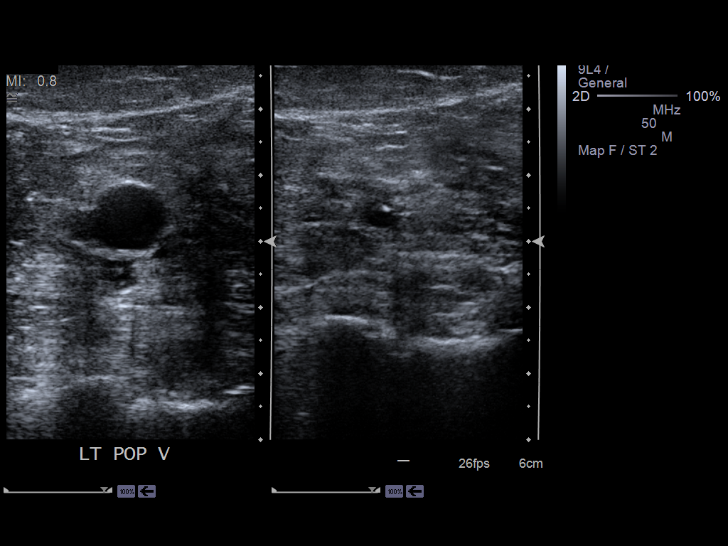
[im 19/22]
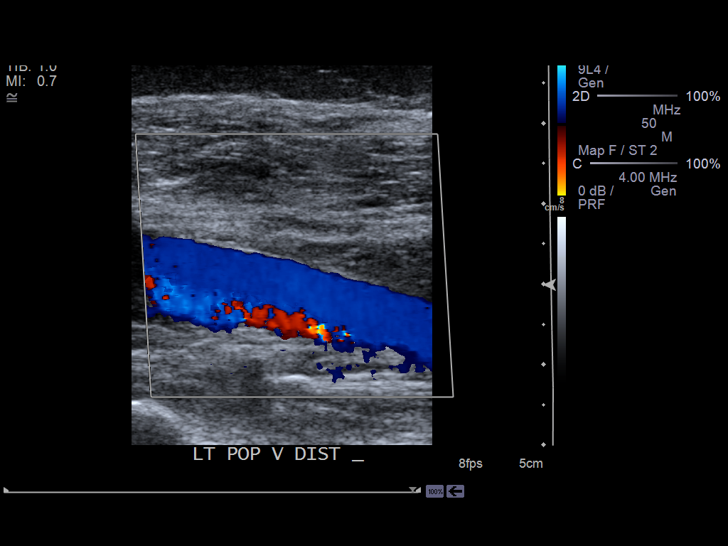
[im 20/22]
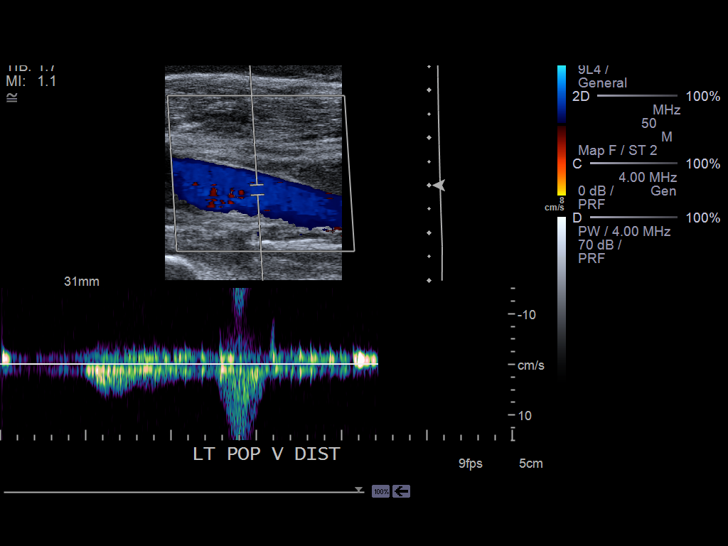
[im 22/22]
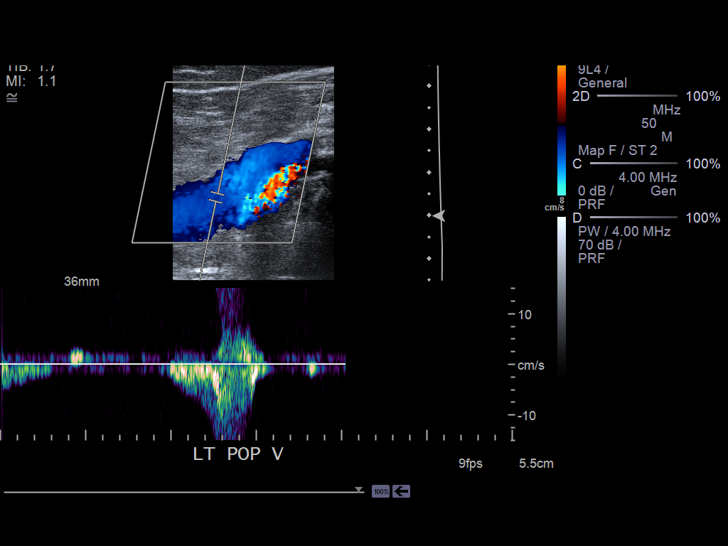

[14 of 22 positions shown; findings below may reference images not displayed]

PROCEDURE:     US  - US DOPPLER LOW EXTR LEFT  - December 16, 2011  [DATE]

RESULT:     Grayscale and color flow Doppler evaluation of the deep veins of
the left lower extremity was performed.

The left femoral and popliteal veins are normally compressible. The waveform
patterns are normal and the color flow images are normal. The response to
the augmentation and Valsalva maneuvers is normal.
IMPRESSION: There is no evidence of thrombus within the left femoral or
popliteal veins.

[REDACTED]

## 2014-01-08 IMAGING — CR DG CHEST 1V PORT
1 series · 1 of 1 positions shown · non-contrast
Comparison: none

REASON FOR EXAM: syncope
COMMENTS:

[ap]
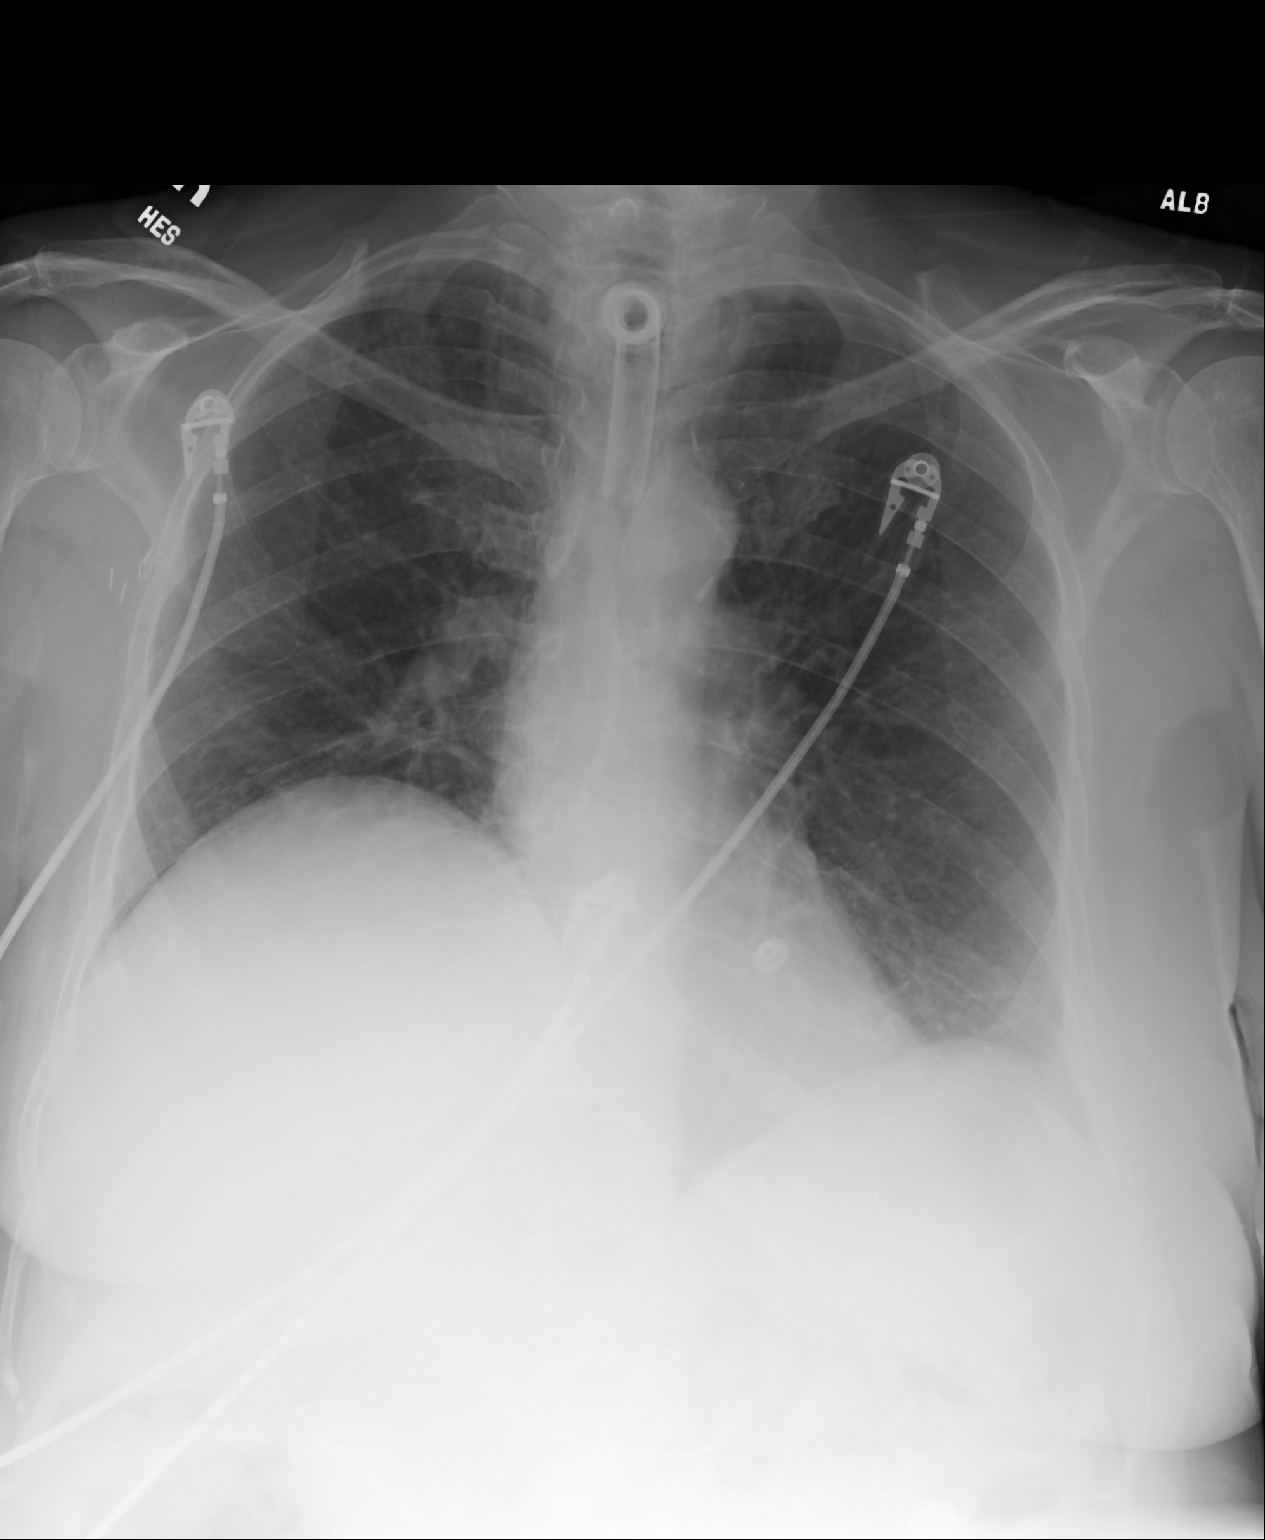

[1 of 1 positions shown; findings below may reference images not displayed]

PROCEDURE:     DXR - DXR PORTABLE CHEST SINGLE VIEW  - December 16, 2011  [DATE]

RESULT:     Comparison is made to the study 24 January, 2011.

The right hemidiaphragm is elevated. There is a tracheostomy appliance in
place. The aerated portions of both lungs appear clear. The cardiac
silhouette is normal in size.
IMPRESSION: There is no evidence of acute cardiopulmonary abnormality.

[REDACTED]

## 2014-01-09 IMAGING — CT CT CHEST W/ CM
2 series · 15 of 31 positions shown, 19 images · IV contrast (APPLIED)
Comparison: none

REASON FOR EXAM: CP, elevated d-dimer
COMMENTS:

PROCEDURE:     CT  - CT CHEST (FOR PE) W  - December 17, 2011 [DATE]
RESULT:     History: Chest pain.
Comparison Study: Prior CT of 12/10/2010.

[Series 4: soft tissue · axial · 0.68mm/px · z∈[-118,-76]mm · 2 of 95 slices shown]
[im 8/95  mediastinal]
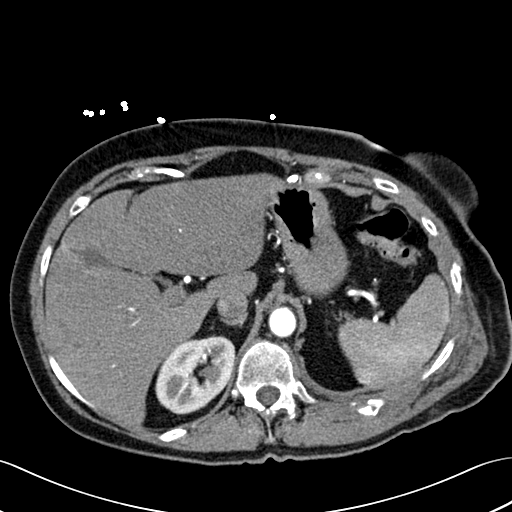
[im 22/95  mediastinal]
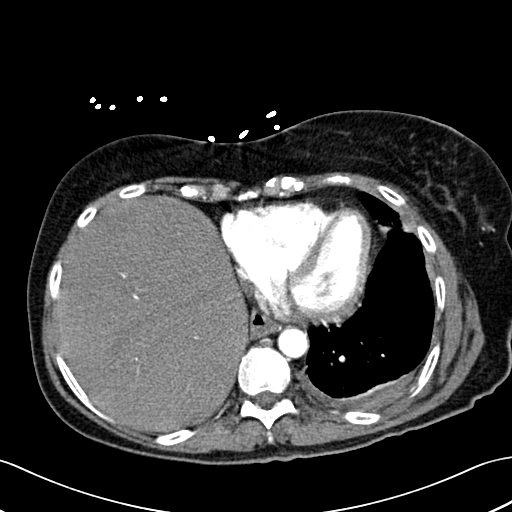

[Series 5: lung windows · axial · 0.68mm/px · z∈[-112,+120]mm · 13 of 93 slices shown, 17 images]
[im 8/93  mediastinal]
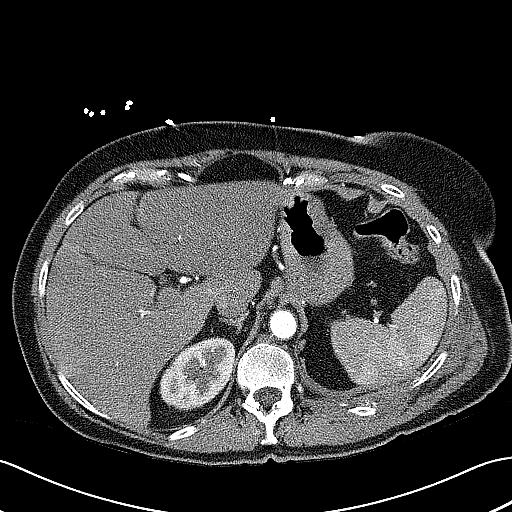
[im 8/93  lung]
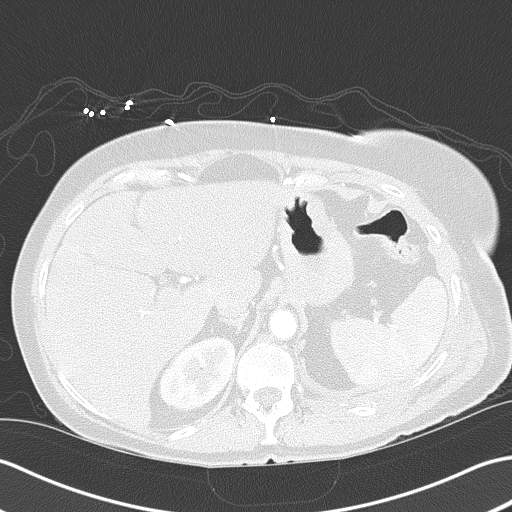
[im 15/93  lung]
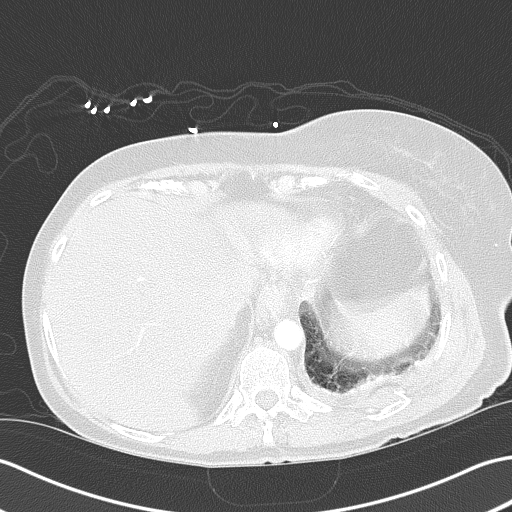
[im 22/93  lung]
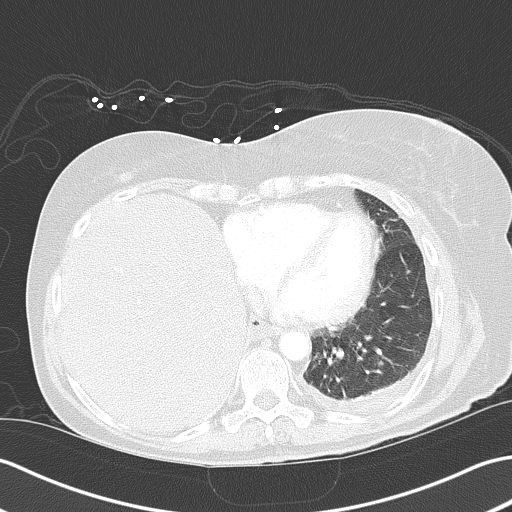
[im 29/93  lung]
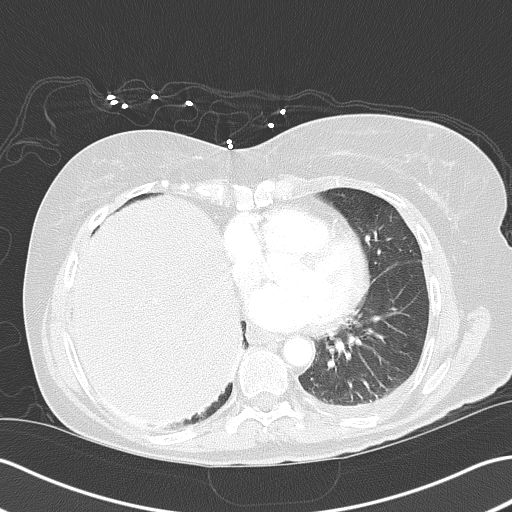
[im 36/93  mediastinal]
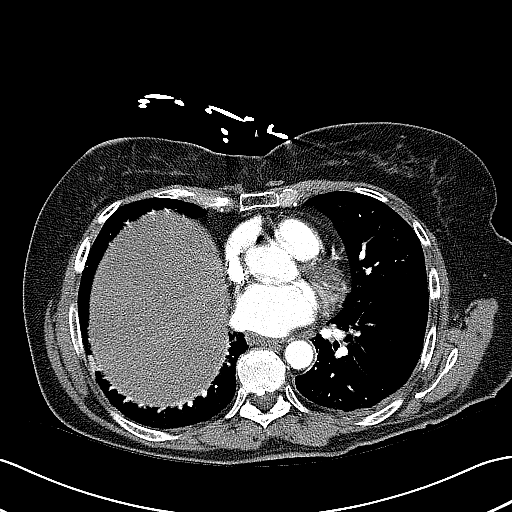
[im 36/93  lung]
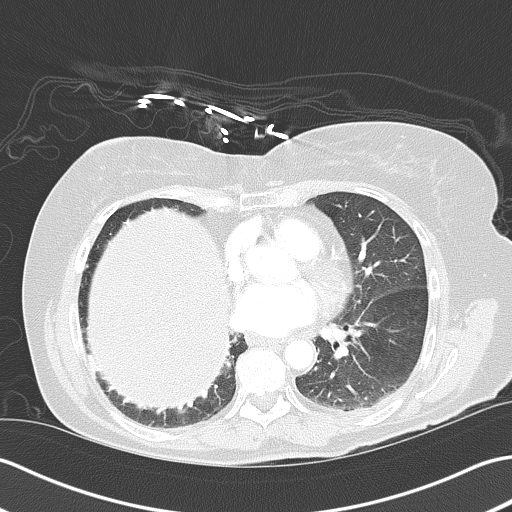
[im 43/93  lung]
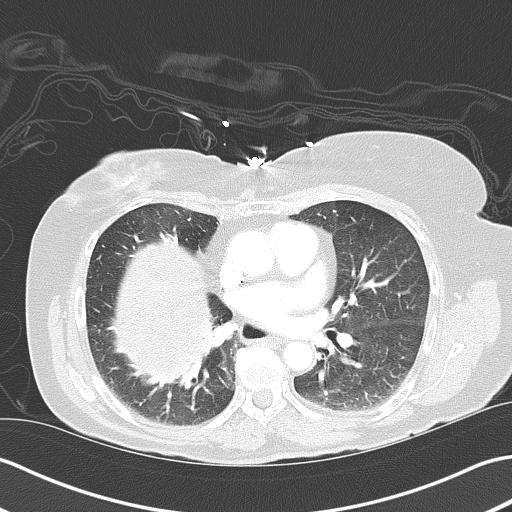
[im 47/93  lung]
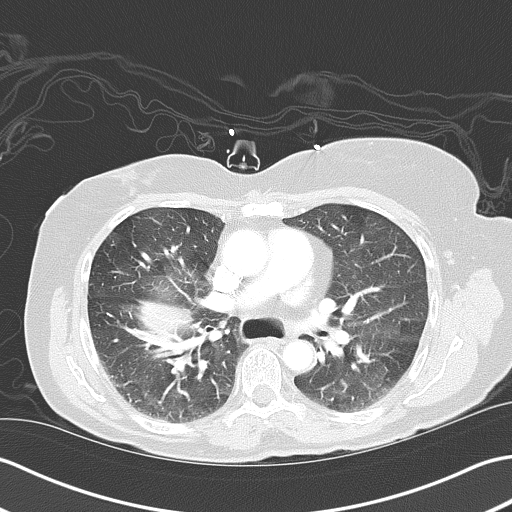
[im 50/93  lung]
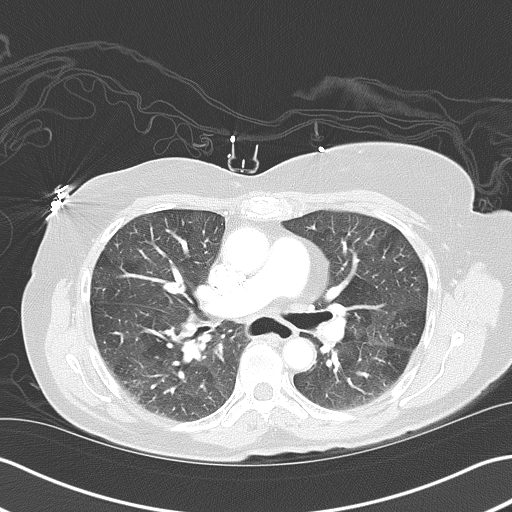
[im 57/93  mediastinal]
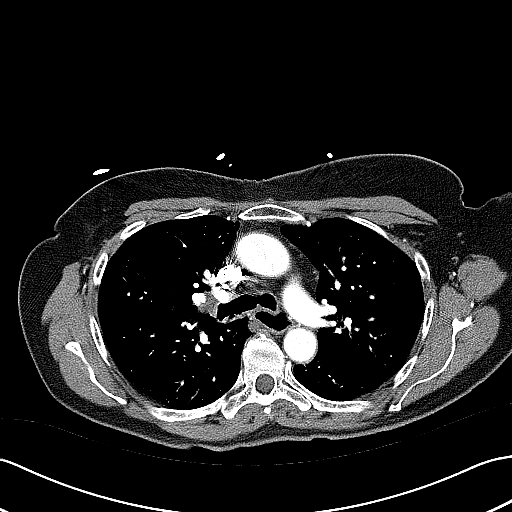
[im 57/93  lung]
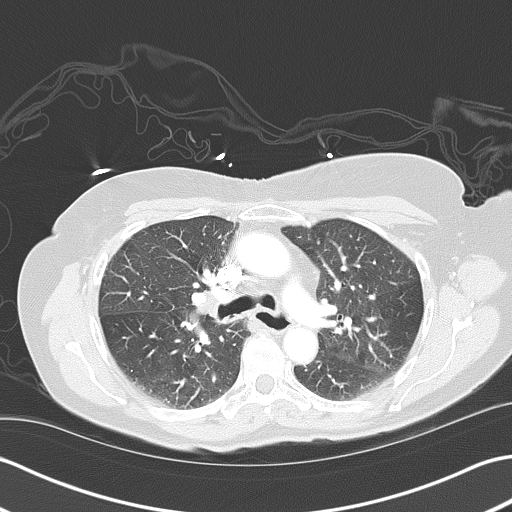
[im 64/93  lung]
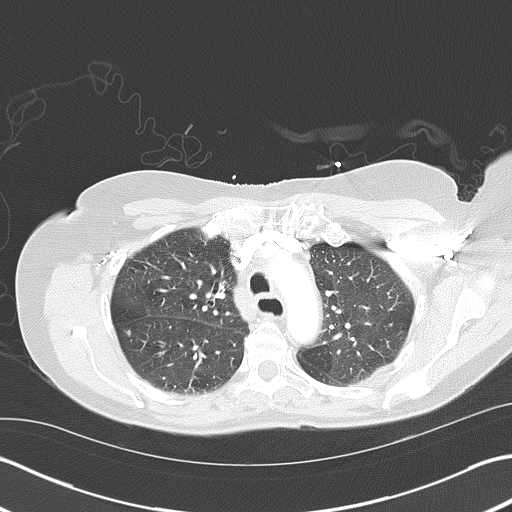
[im 71/93  lung]
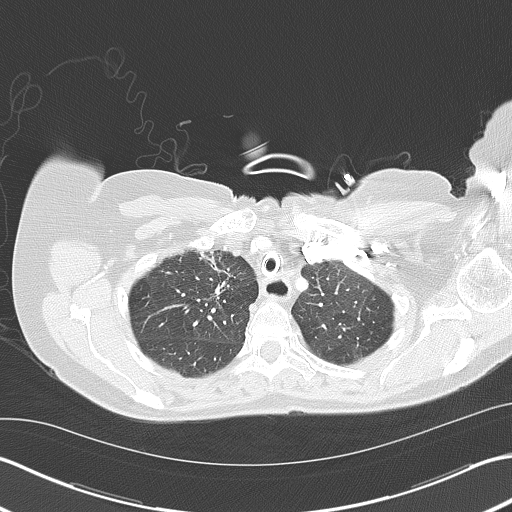
[im 78/93  lung]
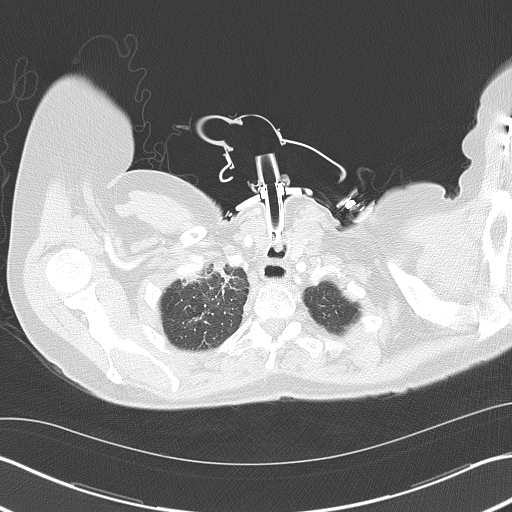
[im 85/93  mediastinal]
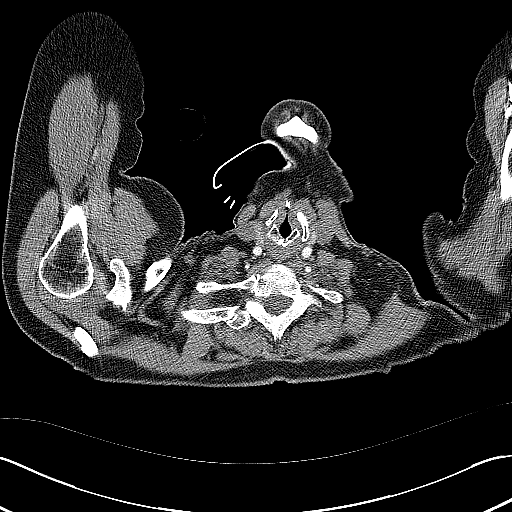
[im 85/93  lung]
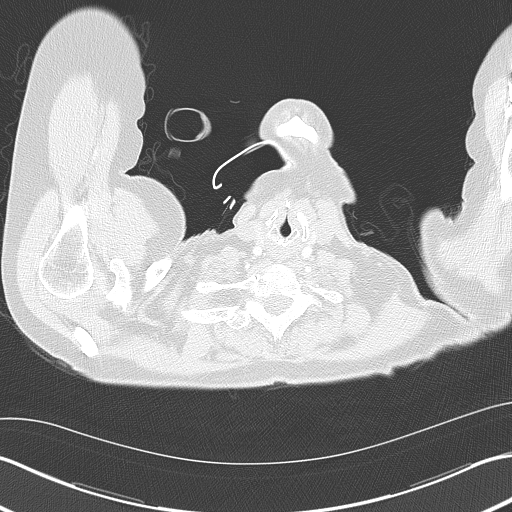

[15 of 31 positions shown; findings below may reference images not displayed]

FINDINGS: Standard CT performed with 100 cc of 4sovue-CBV Tracheostomy tube
in good position. Small mediastinal and hilar lymph nodes are present. Heart
size normal. Thoracic aorta nondistended. Pulmonary arteries normal.
Bilateral diffuse of interstitial frontal small pleural effusions are
present. Congestive are clear could present this fashion. A component of
interstitial scarring may be present. Active interstitial disease including
pneumonitis cannot be excluded. Stable tiny subcentimeter pulmonary nodule
right upper lobe. Biapical pleural parenchymal thickening which is stable
and consistent with scarring. Adrenals are normal.
IMPRESSION: No evidence of pulmonary embolus,  cannot exclude
pneumonitis and/or mild congestive heart failure.

I

## 2014-01-14 ENCOUNTER — Ambulatory Visit (INDEPENDENT_AMBULATORY_CARE_PROVIDER_SITE_OTHER): Payer: Self-pay | Admitting: General Surgery

## 2014-01-14 ENCOUNTER — Encounter: Payer: Self-pay | Admitting: General Surgery

## 2014-01-14 VITALS — BP 118/64 | HR 74 | Resp 20 | Ht 65.0 in | Wt 167.0 lb

## 2014-01-14 DIAGNOSIS — Z853 Personal history of malignant neoplasm of breast: Secondary | ICD-10-CM

## 2014-01-14 DIAGNOSIS — Z86010 Personal history of colonic polyps: Secondary | ICD-10-CM

## 2014-01-14 DIAGNOSIS — L989 Disorder of the skin and subcutaneous tissue, unspecified: Secondary | ICD-10-CM

## 2014-01-14 NOTE — Patient Instructions (Addendum)
In recall for one year

## 2014-01-14 NOTE — Progress Notes (Signed)
This is a 73 year old female here today for her post op colonoscopy done on 11/4/145. Patient states she is doing well.  Additional vews of right breast showed no abnormality. Colonoscopy -2 polyps removed-tubulovillous adenomas, no dysplasia. Skin lesion was benign. All of these explained to pt. Return to yrly f/u

## 2014-01-15 ENCOUNTER — Encounter: Payer: Self-pay | Admitting: General Surgery

## 2014-02-09 ENCOUNTER — Ambulatory Visit (INDEPENDENT_AMBULATORY_CARE_PROVIDER_SITE_OTHER): Payer: Medicare Other | Admitting: Podiatry

## 2014-02-09 ENCOUNTER — Encounter: Payer: Self-pay | Admitting: Podiatry

## 2014-02-09 ENCOUNTER — Ambulatory Visit (INDEPENDENT_AMBULATORY_CARE_PROVIDER_SITE_OTHER): Payer: Medicare Other

## 2014-02-09 VITALS — BP 118/60 | HR 70 | Resp 16 | Ht 65.0 in | Wt 167.0 lb

## 2014-02-09 DIAGNOSIS — M2011 Hallux valgus (acquired), right foot: Secondary | ICD-10-CM

## 2014-02-09 DIAGNOSIS — M109 Gout, unspecified: Secondary | ICD-10-CM | POA: Diagnosis not present

## 2014-02-09 DIAGNOSIS — M779 Enthesopathy, unspecified: Secondary | ICD-10-CM

## 2014-02-09 DIAGNOSIS — M21611 Bunion of right foot: Secondary | ICD-10-CM

## 2014-02-09 MED ORDER — TRIAMCINOLONE ACETONIDE 10 MG/ML IJ SUSP
10.0000 mg | Freq: Once | INTRAMUSCULAR | Status: AC
  Administered 2014-02-09: 10 mg

## 2014-02-09 NOTE — Progress Notes (Signed)
Subjective:     Patient ID: Angela David, female   DOB: Jun 18, 1940, 73 y.o.   MRN: [id number]  HPI patient presents with a lot of pain on the outside of the left ankle and also near the Achilles tendon that occurred spontaneous. Patient stated that prior to then she has been doing well but this is becoming very debilitating and her sugars been running well and she does see Dr. Lucky Cowboy for vascular. 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      Objective:   Physical Exam  Constitutional: She is oriented to person, place, and time.  Cardiovascular: Intact distal pulses.   Musculoskeletal: Normal range of motion.  Neurological: She is oriented to person, place, and time.  Skin: Skin is warm.  Nursing note and vitals reviewed.  neurovascular status intact with muscle strength adequate and range of motion subtalar and midtarsal joint within normal limits. Quite a bit of edema in the posterior lateral aspect of the left heel and also along the peroneal complex with redness in the area and inflammation. Negative Homan sign was noted and patient's digits are found to be well perfused and patient well oriented 3     Assessment:     Appears to be inflammatory and cannot rule out gout or other arthritic systemic disease.    Plan:     H&P and x-ray performed and today I injected the lateral ankle area 3 mg Kenalog 5 mg Xylocaine and sent this patient for arthritic profile to rule out gout or arthritic condition. Reappoint to review results in 1 week and she will take it easy over the next few days

## 2014-02-09 NOTE — Progress Notes (Signed)
   Subjective:    Patient ID: Angela David, female    DOB: 09/16/1940, 73 y.o.   MRN: [id number]  HPI Comments: Foot and ankle swelling and red and painful , patient of dr dew , this started last Wednesday. Can hardly put sheets on the foot , it has got worse. Diabetic last aic 6.1 . Red big toe feels like a rubber band around it . Have neuropathy in both feet   Foot Pain      Review of Systems  Endocrine:       Diabetes        Objective:   Physical Exam        Assessment & Plan:

## 2014-02-10 LAB — SEDIMENTATION RATE: Sed Rate: 9 mm/hr (ref 0–40)

## 2014-02-10 LAB — URIC ACID: Uric Acid: 7.6 mg/dL — ABNORMAL HIGH (ref 2.5–7.1)

## 2014-02-11 LAB — ANA: Anti Nuclear Antibody(ANA): NEGATIVE

## 2014-02-11 LAB — C-REACTIVE PROTEIN: CRP: 6.5 mg/L — ABNORMAL HIGH (ref 0.0–4.9)

## 2014-02-11 LAB — RHEUMATOID FACTOR: Rhuematoid fact SerPl-aCnc: 12.4 IU/mL (ref 0.0–13.9)

## 2014-02-16 ENCOUNTER — Ambulatory Visit (INDEPENDENT_AMBULATORY_CARE_PROVIDER_SITE_OTHER): Payer: Medicare Other | Admitting: Podiatry

## 2014-02-16 ENCOUNTER — Encounter: Payer: Self-pay | Admitting: Podiatry

## 2014-02-16 VITALS — BP 115/80 | HR 106 | Resp 18

## 2014-02-16 DIAGNOSIS — M779 Enthesopathy, unspecified: Secondary | ICD-10-CM

## 2014-02-16 DIAGNOSIS — M109 Gout, unspecified: Secondary | ICD-10-CM | POA: Diagnosis not present

## 2014-02-17 NOTE — Progress Notes (Signed)
Subjective:     Patient ID: Angela David, female   DOB: 1940/03/26, 73 y.o.   MRN: [id number]  HPI patient states that her left foot is feeling quite a bit better and the redness and swelling has reduced quite well   Review of Systems     Objective:   Physical Exam Neurovascular status unchanged with range of motion of the subtalar midtarsal joint improved and muscle strength adequate. Patient's lateral left foot is improved with diminishment of redness diminishment of swelling noted    Assessment:     Improved probable gout or inflammatory tendinitis left lateral foot    Plan:     Reviewed blood work indicating elevation of uric acid and discussed a reduced gout diet that she will follow. May require medications and I gave her a copy of her blood work for her family physician and she will be seen back if she develops another flare up

## 2014-03-25 ENCOUNTER — Ambulatory Visit: Payer: Self-pay | Admitting: Internal Medicine

## 2014-06-15 NOTE — Consult Note (Signed)
Brief Consult Note: Diagnosis: dysphagia.   Patient was seen by consultant.   Discussed with Attending MD.   Comments: 74 y.o. female with history of tracheostomy tube placement and long standing dysphagia.  Has been having some regurgitation/choking with solid foods.  Uses thickening agent for liquids.  No coughing out fluids/food from tracheostomy tube during episodes.  Plan:  Discussed with patient her history and symptoms, no issues with trach at this time and mostly with dysphagia.  Needs evaluation with either barium swallow or visual examination.  Patient already has appointment for what sounds like endoscopy at [facility] next week and wishes to hold off on further evaluation until it can be done at [facility] routine trach care.  Electronic Signatures: Radhika Dershem, Shela Leff (MD)  (Signed 21-Oct-13 13:03)  Authored: Brief Consult Note   Last Updated: 21-Oct-13 13:03 by Pascal Lux (MD)

## 2014-06-15 NOTE — Discharge Summary (Signed)
PATIENT NAME:  Angela David, Angela David MR#:  [id number] DATE OF BIRTH:  1940/07/11  DATE OF ADMISSION:  12/16/2011 DATE OF DISCHARGE:  12/18/2011  PRIMARY CARE PHYSICIAN: Benita Stabile, MD  DISCHARGE DIAGNOSES:  1. Syncope due to blockage of tracheostomy tube with mucous plug.  2. Diabetes mellitus.  3. Hypertension.  4. Vocal cord paralysis.   HISTORY OF PRESENT ILLNESS: The patient is a 74 year old female with history of vocal cord  paralysis requiring tracheostomy and also history of breast cancer, hypertension, and diabetes. She had history of tachy palpitations and presented to the Emergency Room with chest discomfort associated with tachy palpitations with subsequent syncopal episode. In the Emergency Room, the patient was noted to have an elevated D-dimer. Right lower extremity ultrasound was unremarkable. Chest x-ray was unremarkable, and she was having a lot of difficulty swallowing and was being followed by ENT at [facility]. She was scheduled for swallow evaluation in the near future, and she was also having a lot of mucous plugging of her tracheostomy. She denied any fever, chest pain on presentation, and was admitted for further evaluation.   HOSPITAL COURSE: She was admitted on telemetry for further monitoring of any arrhythmia episode, but she remained asymptomatic and without any abnormality in her telemetry findings. Her ENT evaluation and swallowing evaluations were done for her complaints. The ENT doctor suggested that the patient already had an appointment for possible endoscopy at [facility] next week, so he suggested to follow with that appointment and routine tracheostomy care as the patient has been doing at home. No further intervention in this admission. Speech and Swallow evaluations were done, and they recommended to continue with puree diet. There was no other significant cause found for her syncopal episode after an extensive work-up was done. Please see the results below. So, she was  discharged with assuming that the syncopal episode was due to mucous plug in her tracheostomy tube, and she was feeling fine and so discharged home.   Other medical issues handled during this hospital stay:  1. Atypical chest pain and tachy palpitations: As mentioned above, she was monitored on telemetry and remained without any event. Troponins remained negative.  2. Hypomagnesemia: Replaced. 3. Elevated D-dimer: CT pulmonary angiogram was done which was negative for any pulmonary embolism.  Doppler venous lower extremity was negative.  4. Diabetes mellitus: Maintained on regular insulin coverage.  5. Hypertension: Continued metoprolol.   [place], DIAGNOSTIC AND RADIOLOGICAL DATA:  D-dimer on admission was 1.1. EKG was sinus tachycardia, possible left atrial enlargement. Chest x-ray, portable, was showing no evidence of acute cardiopulmonary abnormalities. Troponin was less than 0.02 on admission. Magnesium level was 1.5 and phosphorus level was 2.2 on admission. There was no evidence of thrombus within the left femoropopliteal vein by ultrasound color Doppler of the left leg. Carotid Doppler study showed no evidence of hemodynamically significant carotid stenosis. Repeated troponin was also less than 0.02. Magnesium level after correction was 2.2. CT of the chest pulmonary angiogram was no evidence of pulmonary embolus, cannot exclude pneumonitis or mild congestive heart failure. Echocardiogram of the heart reported left ventricular systolic function is normal. Ejection fraction more than 55. The right ventricular systolic function is normal. Right ventricular systolic pressure is normal.   CONDITION ON DISCHARGE: Satisfactory.   CODE STATUS:  FULL CODE.     DISCHARGE MEDICATIONS:  1. Folic acid 1 mg oral tablet once a day.  2. Ferrous sulfate 325 mg oral tablet b.i.d. 3. Metoprolol tartrate 25 mg oral  tablet b.i.d.  4. Protonix 40 mg delayed-release once a day. 5. Effexor  XR 75 mg oral capsule extended release 1 capsule orally once a day. 6. Lovastatin 40 mg oral tablet once a day. 7. Metformin 1000 mg oral tablet b.i.d.   8. Buspirone 15 mg oral tablet q.i.d.  9. Lorazepam 0.5 mg oral tablet t.i.d.  10. Caltrate 600 mg oral tablet once a day.  11. Vitamin E 400 international units oral capsule once a day. 12. Vitamin D3 1000 international units oral tablet b.i.d. 13. Biotin 1000 mcg oral tablet once a day. 14. Hydrochlorothiazide 125 mg oral tablet once a day. 15. Zyrtec 10 mg oral tablet once a day.   DISCHARGE INSTRUCTIONS AND FOLLOWUP:   1. She was discharged with Home Health aide and no oxygen requirement.  2. Diet: Low fat, low cholesterol, mechanical soft pureed diet.  3. Activity limitation: As tolerated.  4. Referral to ENT Clinic as she was following and she had an appointment next week for trach care.  5. She was advised to follow within 1 to 2 weeks with her primary medical doctor, Dr. Benita Stabile, at [place], [place].   TIME SPENT ON DISCHARGE: 45 minutes.   ____________________________ Ceasar Lund Anselm Jungling, MD [id number] D: 12/20/2011 21:11:15 ET T: 12/21/2011 12:07:02 ET JOB#: [id number]  cc: Ceasar Lund. Anselm Jungling, MD, <Dictator> Leona Carry. Hall Busing, MD Vaughan Basta MD ELECTRONICALLY SIGNED 01/15/2012 21:52

## 2014-06-15 NOTE — H&P (Signed)
PATIENT NAME:  Angela David, Angela David MR#:  [id number] DATE OF BIRTH:  [birth year]  DATE OF ADMISSION:  12/16/2011  REFERRING PHYSICIAN: Loletta Specter, MD  FAMILY PHYSICIAN: Benita Stabile, MD  REASON FOR ADMISSION: Chest discomfort with palpitations and syncope.   HISTORY OF PRESENT ILLNESS: The patient is a 74 year old female with a history of vocal cord paralysis requiring tracheostomy, also has a history of breast cancer, hypertension, and diabetes. She has a history of tachy palpitations. She presents to the Emergency Room with chest discomfort associated with tachy palpitations with subsequent syncope. In the Emergency Room, the patient was noted to have an elevated d-dimer. Right lower extremity ultrasound was unremarkable. Chest x-ray was unremarkable. She has been having a lot of difficulty swallowing and is being followed by ENT at [facility] at this time. She was scheduled for a swallow evaluation in the near future. She has also been having a lot of mucous plugging in her tracheostomy. Denies any fevers. No chest pain at present. She is now admitted for further evaluation.   PAST MEDICAL HISTORY:  1. Chronic respiratory failure in the setting of vocal cord paralysis, now status post tracheostomy.  2. Iron deficiency anemia.  3. History of recurrent mucous plugging.  4. History of tracheitis.  5. Benign hypertension.  6. Hyperlipidemia.  7. Breast cancer status post lumpectomy.  8. Type 2 diabetes.  9. Questionable history of sleep apnea.  10. Environmental allergies.  11. Anxiety/depression.   MEDICATIONS:  1. Zyrtec 10 mg p.o. daily.  2. Vitamin E 400 units p.o. daily.  3. Vitamin D 2000 units p.o. daily x4.  4. Protonix 40 mg p.o. daily.  5. Lopressor 25 mg p.o. twice a day. 6. Metformin 1000 mg p.o. twice a day. 7. Mevacor 40 mg p.o. every p.m. 8. Ativan 0.5 mg p.o. three times daily. 9. Hydrochlorothiazide 25 mg p.o. daily as needed for swelling.  10. Folic acid 1 mg  daily. 11. Iron sulfate 325 mg p.o. twice a day.  12. Effexor-XR 75 mg p.o. daily.  13. BuSpar 15 mg p.o. four times daily.   ALLERGIES: Shellfish, codeine, eggs, and Toradol.   SOCIAL HISTORY: Negative for alcohol or tobacco use. She is married, lives with her husband.   FAMILY HISTORY: Positive for lymphoma, coronary artery disease, and prostate cancer.   REVIEW OF SYSTEMS: CONSTITUTIONAL: No fever or change in weight. EYES: No blurred or double vision. No glaucoma. ENT: No tinnitus or hearing loss. No nasal discharge or bleeding. Has chronic difficulty swallowing. RESPIRATORY: The patient has had cough but no wheezing. No hemoptysis. No painful respiration. CARDIOVASCULAR: Chest pain as per history of present illness. No orthopnea. No edema. GI: No nausea, vomiting, or diarrhea. No change in bowel habits. No abdominal pain. GU: No dysuria or hematuria. No incontinence. ENDOCRINE: No polyuria or polydipsia. No heat or cold intolerance. HEMATOLOGIC: The patient denies easy bruising or bleeding. LYMPHATIC: No swollen glands. MUSCULOSKELETAL: The patient does have some pain in her back and ribs. Denies neck or shoulder pain. No knee or hip pain. No gout. NEUROLOGIC: No numbness. Denies migraines, stroke, or seizures. PSYCH: The patient denies anxiety, insomnia, or depression.   PHYSICAL EXAMINATION:   GENERAL: The patient is chronically ill appearing, in no acute distress.   VITAL SIGNS: Vital signs are currently remarkable for a blood pressure of 136/63 with a heart rate of 116 and a respiratory rate of 18. She is afebrile.   HEENT: Normocephalic, atraumatic. Pupils are equally round and reactive to  light and accommodation. Extraocular movements are intact. Sclerae anicteric. Conjunctivae are clear. Oropharynx is clear.   NECK: Supple with tracheostomy in place. There was no purulence or drainage.   LUNGS: Scattered rhonchi. No wheezes or rales. No dullness.   CARDIAC: Rapid rate with a  regular rhythm. Normal S1 and S2. No significant murmurs.   ABDOMEN: Soft and nontender with normoactive bowel sounds. No organomegaly or masses were appreciated. No hernias or bruits were noted.   EXTREMITIES: No clubbing, cyanosis, or edema. Pulses were 2+ bilaterally.   SKIN: Warm and dry without rash or lesions.   NEUROLOGIC: Cranial nerves II through XII are grossly intact. Deep tendon reflexes were symmetric. Motor and sensory exam is nonfocal.   PSYCH: The patient was alert and oriented to person, place, and time. She was cooperative and used good judgment.   LABORATORY, DIAGNOSTIC AND RADIOLOGIC DATA: Left lower extremity Doppler was negative for deep vein thrombosis.   Magnesium was 1.5. Phosphorus was 2.2. Glucose was 110 with a BUN of 18 and a creatinine of 0.93 and a sodium of 137 with a potassium of 3.7. GFR was greater than 60. Troponin was less than 0.02. Total CK was 80 with an MB of 0.7.   Chest x-ray was unremarkable.   D-dimer was 1.1. White count was 10.1 with a hemoglobin of 15.9.   EKG revealed sinus tachycardia with no acute ischemic changes.   ASSESSMENT:  1. Tachy palpitations.  2. Atypical chest pain.  3. Syncope, presumably cardiogenic.  4. Hypomagnesemia.  5. Elevated d-dimer.  6. Type 2 diabetes.  7. Vocal cord paralysis with chronic tracheostomy.  8. Environmental allergies.   PLAN: The patient will be admitted to the floor with telemetry and maintained on Lovenox. We will follow her heart rhythm on telemetry and follow serial cardiac enzymes. We will obtain an echocardiogram and carotid Doppler's because of her syncopal episode. We will supplement her magnesium at this time. We will obtain a chest CT because of her elevated d-dimer and to rule out pulmonary embolism. We will obtain a speech therapy consult because of her dysphagia. Use dysphagia diet for now. We will also consult ENT because of her tracheostomy, mucous plugging, and swallowing  difficulty. Follow-up routine labs in the morning. Further treatment and evaluation will depend upon the patient's progress.   TOTAL TIME SPENT ON THIS PATIENT: 50 minutes.  ____________________________ Leonie Douglas Doy Hutching, MD jds:slb D: 12/16/2011 17:38:16 ET T: 12/17/2011 07:19:26 ET JOB#: [id number]  cc: Leonie Douglas. Doy Hutching, MD, <Dictator> Leona Carry. Hall Busing, MD Shameika Speelman Lennice Sites MD ELECTRONICALLY SIGNED 12/17/2011 8:55

## 2014-06-21 LAB — SURGICAL PATHOLOGY

## 2014-07-09 ENCOUNTER — Other Ambulatory Visit: Payer: Self-pay | Admitting: Internal Medicine

## 2014-07-09 DIAGNOSIS — G44309 Post-traumatic headache, unspecified, not intractable: Secondary | ICD-10-CM

## 2014-07-11 ENCOUNTER — Emergency Department
Admission: EM | Admit: 2014-07-11 | Discharge: 2014-07-11 | Disposition: A | Discharge status: Home / Self Care | Payer: Medicare Other | Attending: Emergency Medicine | Admitting: Emergency Medicine

## 2014-07-11 ENCOUNTER — Emergency Department: Payer: Medicare Other

## 2014-07-11 ENCOUNTER — Encounter: Payer: Self-pay | Admitting: *Deleted

## 2014-07-11 DIAGNOSIS — R51 Headache: Secondary | ICD-10-CM | POA: Diagnosis not present

## 2014-07-11 DIAGNOSIS — R519 Headache, unspecified: Secondary | ICD-10-CM

## 2014-07-11 DIAGNOSIS — Z79899 Other long term (current) drug therapy: Secondary | ICD-10-CM | POA: Insufficient documentation

## 2014-07-11 MED ORDER — BUTALBITAL-APAP-CAFFEINE 50-325-40 MG PO TABS
2.0000 | ORAL_TABLET | Freq: Once | ORAL | Status: AC
  Administered 2014-07-11: 2 via ORAL

## 2014-07-11 MED ORDER — BUTALBITAL-APAP-CAFFEINE 50-325-40 MG PO TABS
ORAL_TABLET | ORAL | Status: AC
  Administered 2014-07-11: 2 via ORAL
  Filled 2014-07-11: qty 2

## 2014-07-11 MED ORDER — BUTALBITAL-ASA-CAFFEINE 50-325-40 MG PO CAPS: 1.0000 | ORAL_CAPSULE | Freq: Two times a day (BID) | ORAL | Status: DC | PRN

## 2014-07-11 NOTE — ED Notes (Signed)
D/c instructions reviewed w/ pt - pt denies any further questions or concerns at present.

## 2014-07-11 NOTE — ED Provider Notes (Signed)
[facility] Emergency Department Provider Note  Time seen: 1:22 PM  I have reviewed the triage vital signs and the nursing notes.   HISTORY  Chief Complaint Headache    HPI Angela David is a 74 y.o. female with a past medical history of hyperlipidemia, cancer status post tracheostomy who presents the emergency department with a headache. According to the patient in February she fell hitting the right side of her face on the ground. Patient had a large amount of swelling to the right face and was seen by her primary care doctor but did not have imaging at that time. She states within several weeks of the fall she has developed a right sided headache. The headache has worsened throughout this time. Comes intermittently but much more frequently recently. Denies any fever, neck pain or stiffness. The patient has not had any imaging of her face or head since the fall. The patient has been seen by her primary care doctor for this headache and she has an MRI ordered but not totally and of the month. Patient describes the headache as dull/pressure located on the right side of her face and eye. Denies any visual changes.    Past Medical History  Diagnosis Date  . Glaucoma   . Personal history of malignant neoplasm of breast   . High cholesterol   . Sleep apnea 2011  . Cancer 2002    right breast    Patient Active Problem List   Diagnosis Date Noted  . History of colonic polyps 12/14/2013  . History of breast cancer 11/26/2012    Past Surgical History  Procedure Laterality Date  . Abdominal hysterectomy    . Hydrocele excision / repair    . Colonoscopy  2009,12/30/2013  . Tracheal surgery  2013  . Breast surgery Right 2002    LUMPECTOMY, radiation, chemotherapy  . Breast biopsy  2008  . Tracheostomy N/A     Current Outpatient Rx  Name  Route  Sig  Dispense  Refill  . escitalopram (LEXAPRO) 10 MG tablet   Oral   Take 20 mg by mouth daily.          . busPIRone (BUSPAR) 15 MG tablet   Oral   Take 1 tablet by mouth 4 (four) times daily.         . ferrous sulfate 325 (65 FE) MG tablet   Oral   Take 325 mg by mouth daily.         . folic acid (FOLVITE) 1 MG tablet   Oral   Take 1 mg by mouth daily.         Marland Kitchen LORazepam (ATIVAN) 0.5 MG tablet   Oral   Take 0.5 mg by mouth 3 (three) times daily.         Marland Kitchen lovastatin (MEVACOR) 40 MG tablet   Oral   Take 1 tablet by mouth daily.         . metFORMIN (GLUCOPHAGE) 1000 MG tablet      1 tab by mouth in AM 1.5 tab by mouth in PM         . metoprolol tartrate (LOPRESSOR) 25 MG tablet   Oral   Take 1 tablet by mouth 2 (two) times daily.         . pantoprazole (PROTONIX) 40 MG tablet   Oral   Take 40 mg by mouth daily.         . polyethylene glycol powder (GLYCOLAX/MIRALAX) powder  255 grams one bottle for colonoscopy prep   255 g   0   . venlafaxine XR (EFFEXOR-XR) 75 MG 24 hr capsule   Oral   Take 1 capsule by mouth 4 (four) times daily.           Allergies Codeine; Eggs or egg-derived products; Shellfish allergy; and Sodium sulfite  Family History  Problem Relation Age of Onset  . Cancer Father     bone cancer  . Cancer Sister   . Cancer Brother     Social History History  Substance Use Topics  . Smoking status: Never Smoker   . Smokeless tobacco: Never Used  . Alcohol Use: No    Review of Systems Constitutional: Negative for fever. Eyes: Negative for visual changes. ENT: Positive for sinus pressure/pain Cardiovascular: Negative for chest pain. Respiratory: Negative for shortness of breath. Gastrointestinal: Negative for abdominal pain Genitourinary: Negative for dysuria. Musculoskeletal: Negative for back or neck pain. Neurological: Positive for headache denies any weakness or numbness. 10-point ROS otherwise negative.  ____________________________________________   PHYSICAL EXAM:  VITAL SIGNS: ED Triage Vitals  Enc  Vitals Group     BP 07/11/14 1307 112/8 mmHg     Pulse Rate 07/11/14 1307 80     Resp 07/11/14 1307 20     Temp 07/11/14 1307 97.6 F (36.4 C)     Temp Source 07/11/14 1307 Axillary     SpO2 07/11/14 1307 95 %     Weight 07/11/14 1307 166 lb (75.297 kg)     Height 07/11/14 1307 5\' 5"  (1.651 m)     Head Cir --      Peak Flow --      Pain Score 07/11/14 1318 8     Pain Loc --      Pain Edu? --      Excl. in [place]? --     Constitutional: Alert and oriented. Well appearing and in no distress. Eyes: Normal exam ENT   Head: Normocephalic and atraumatic.  Patient has moderate right frontal and maxillary sinus tenderness to percussion.   Nose: No congestion/rhinnorhea.   Mouth/Throat: Mucous membranes are moist.  Tracheostomy present. Cardiovascular: Normal rate, regular rhythm. No murmurs, rubs, or gallops. Respiratory: Normal respiratory effort without tachypnea nor retractions. Breath sounds are clear Gastrointestinal: Soft and nontender. No distention.   Musculoskeletal: Nontender with normal range of motion in all extremities.  Neurologic:  Normal speech and language. No gross focal neurologic deficits  Skin:  Skin is warm, dry and intact.  Psychiatric: Mood and affect are normal. Speech and behavior are normal.   ____________________________________________    RADIOLOGY  CT scan of face and head are negative.  ____________________________________________   INITIAL IMPRESSION / ASSESSMENT AND PLAN / ED COURSE  Pertinent labs & imaging results that were available during my care of the patient were reviewed by me and considered in my medical decision making (see chart for details).  74 year old female with chronic headaches, right-sided since a fall back in February. Patient states she had a large amount of periorbital edema after the fall. Suspect patient probably had a maxillary sinus/facial fracture. We'll CT her head as well as her sinuses to further  evaluate. ----------------------------------------- 3:18 PM on 07/11/2014 -----------------------------------------  Negative CT scans, somewhat improved with Fioricet although minimally. I discussed with the patient trial of over-the-counter pseudoephedrine as well as Fioricet. Patient agreeable to plan. Will follow up with her primary care doctor to discuss further workup including MRI.  ____________________________________________  FINAL CLINICAL IMPRESSION(S) / ED DIAGNOSES  Headache Sinus headache   Harvest Dark, MD 07/11/14 [phone]

## 2014-07-11 NOTE — Discharge Instructions (Signed)
Sinus Headache A sinus headache happens when your sinuses become clogged or puffy (swollen). Sinus headaches can be mild or severe. HOME CARE  Take your medicines (antibiotics) as told. Finish them even if you start to feel better.  Only take medicine as told by your doctor.  Use a nose spray if you feel stuffed up (congested). GET HELP RIGHT AWAY IF:  You have a fever.  You have trouble seeing.  You suddenly have pain in your face or head.  You start to twitch or shake (seizure).  You are confused.  You get headaches more than once a week.  Light or sound bothers you.  You feel sick to your stomach (nauseous) or throw up (vomit).  Your headaches do not get better with treatment. MAKE SURE YOU:  Understand these instructions.  Will watch your condition.  Will get help right away if you are not doing well or get worse. Document Released: 06/14/2010 Document Revised: 05/07/2011 Document Reviewed: 06/14/2010 [facility] Patient Information 2015 [place], [place]. This information is not intended to replace advice given to you by your health care provider. Make sure you discuss any questions you have with your health care provider.

## 2014-07-17 ENCOUNTER — Ambulatory Visit: Payer: BLUE CROSS/BLUE SHIELD

## 2014-07-20 ENCOUNTER — Ambulatory Visit
Admission: RE | Admit: 2014-07-20 | Discharge: 2014-07-20 | Disposition: A | Discharge status: Home / Self Care | Payer: Medicare Other | Source: Ambulatory Visit | Attending: Internal Medicine | Admitting: Internal Medicine

## 2014-07-20 DIAGNOSIS — G939 Disorder of brain, unspecified: Secondary | ICD-10-CM | POA: Insufficient documentation

## 2014-07-20 DIAGNOSIS — G44309 Post-traumatic headache, unspecified, not intractable: Secondary | ICD-10-CM

## 2014-07-20 DIAGNOSIS — W19XXXA Unspecified fall, initial encounter: Secondary | ICD-10-CM | POA: Insufficient documentation

## 2014-07-20 MED ORDER — GADOBENATE DIMEGLUMINE 529 MG/ML IV SOLN
15.0000 mL | Freq: Once | INTRAVENOUS | Status: AC | PRN
  Administered 2014-07-20: 15 mL via INTRAVENOUS

## 2014-09-15 DIAGNOSIS — R51 Headache: Secondary | ICD-10-CM

## 2014-09-15 DIAGNOSIS — R519 Headache, unspecified: Secondary | ICD-10-CM | POA: Insufficient documentation

## 2014-09-15 DIAGNOSIS — G44309 Post-traumatic headache, unspecified, not intractable: Secondary | ICD-10-CM | POA: Insufficient documentation

## 2014-10-11 ENCOUNTER — Other Ambulatory Visit: Payer: Self-pay

## 2014-10-11 DIAGNOSIS — Z1231 Encounter for screening mammogram for malignant neoplasm of breast: Secondary | ICD-10-CM

## 2014-10-22 DIAGNOSIS — G44329 Chronic post-traumatic headache, not intractable: Secondary | ICD-10-CM | POA: Insufficient documentation

## 2014-12-07 IMAGING — MG MM DIGITAL SCREENING BILAT W/ CAD
1 series · 6 of 6 positions shown · non-contrast
Comparison: Previous exam(s).

REASON FOR EXAM: scr mammo no order HX BRST CA
COMMENTS:

PROCEDURE:     MAM - MAM DGTL SCRN MAM NO ORDER W/CAD  - November 13, 2012  [DATE]
CLINICAL DATA: Screening.
DIGITAL SCREENING BILATERAL MAMMOGRAM WITH CAD

[R CC · right · 6 of 6 slices shown]
[im 1/6]
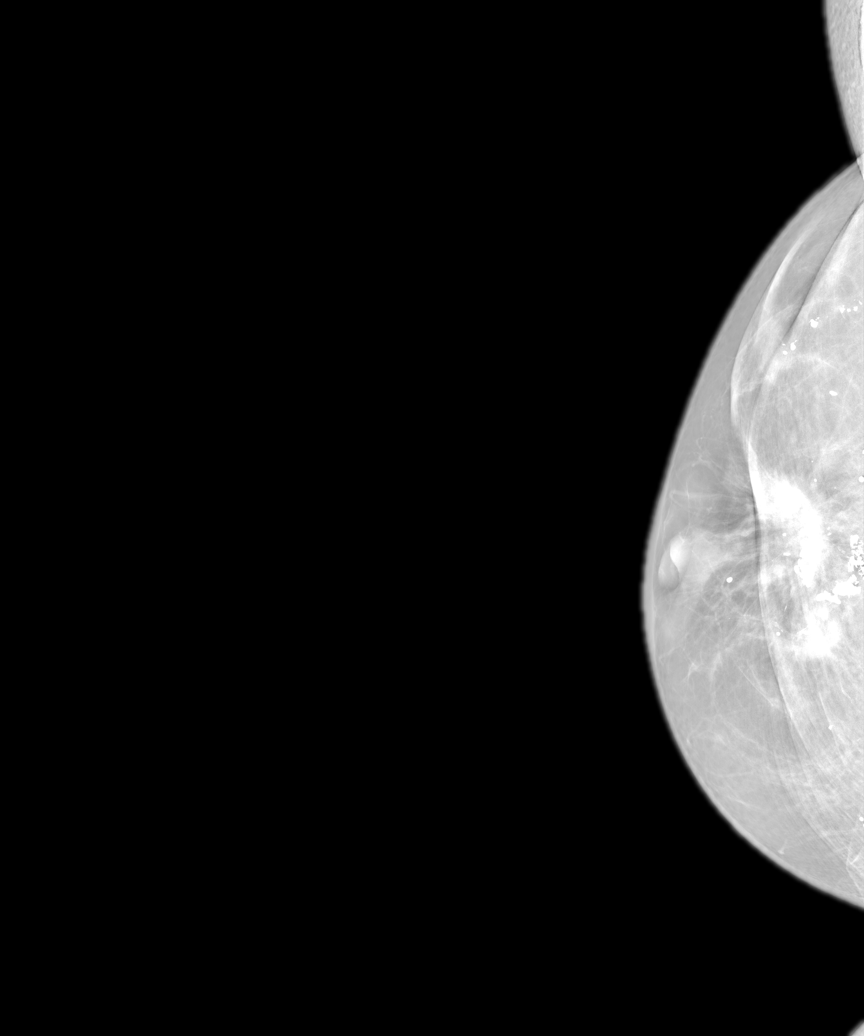
[im 2/6]
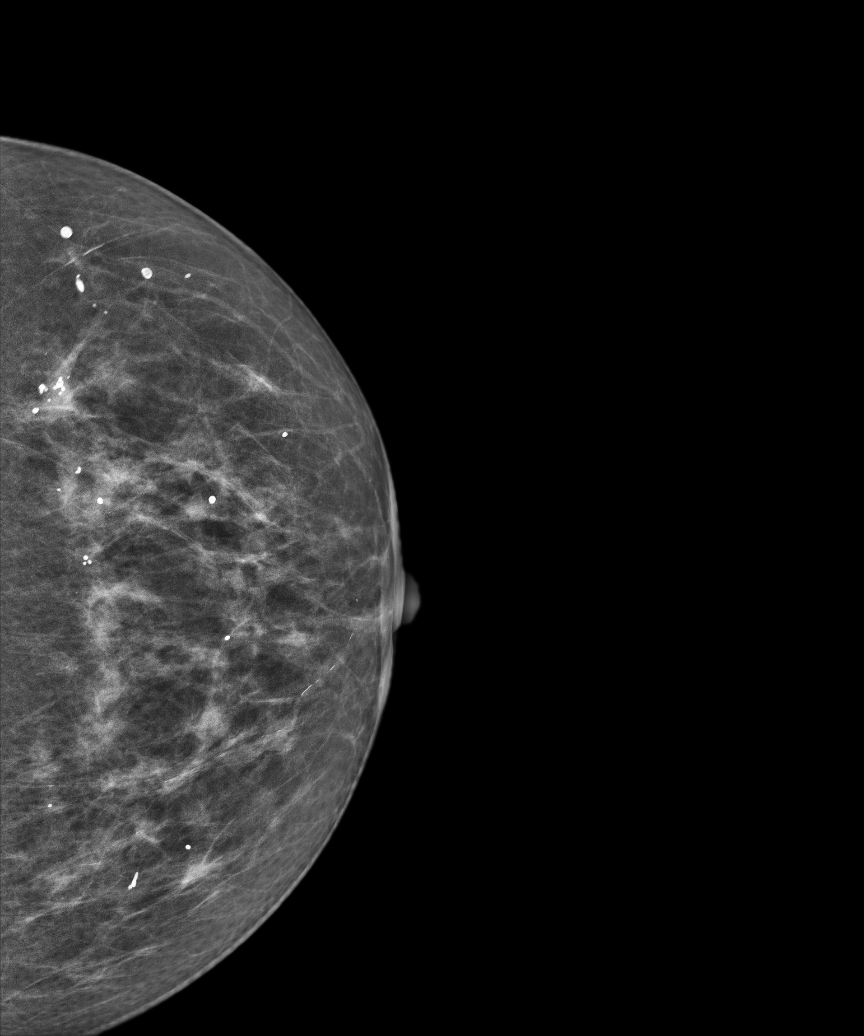
[im 3/6]
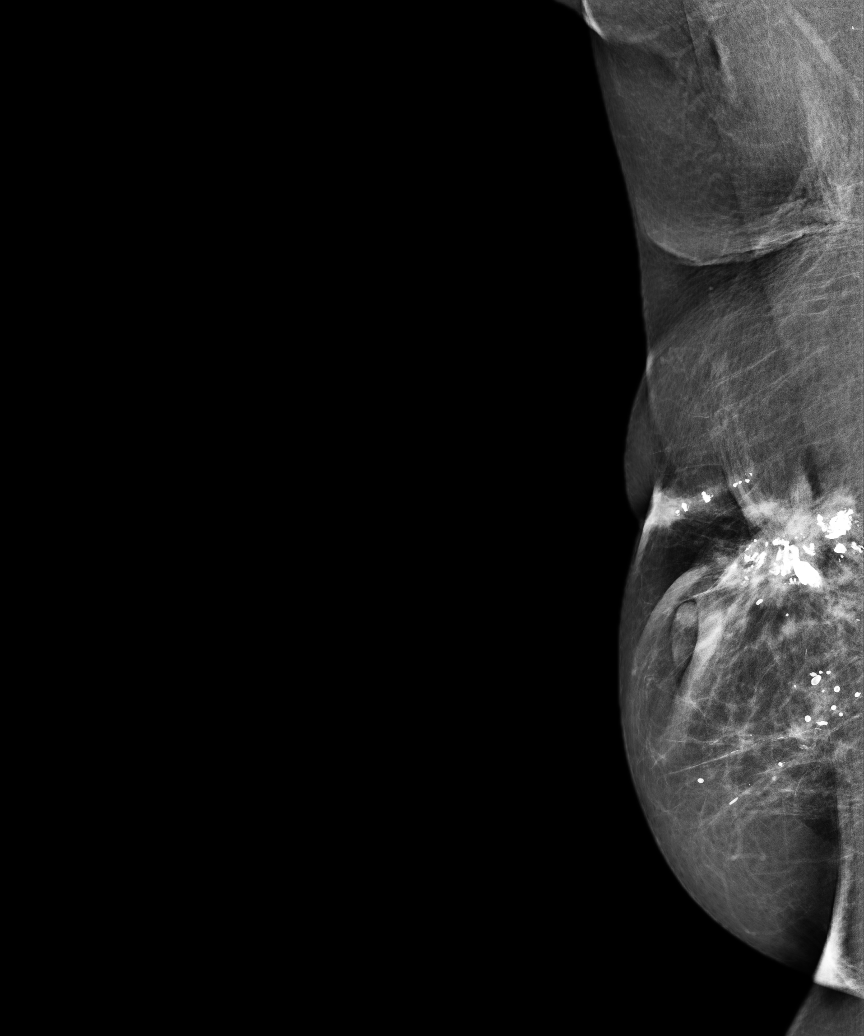
[im 4/6]
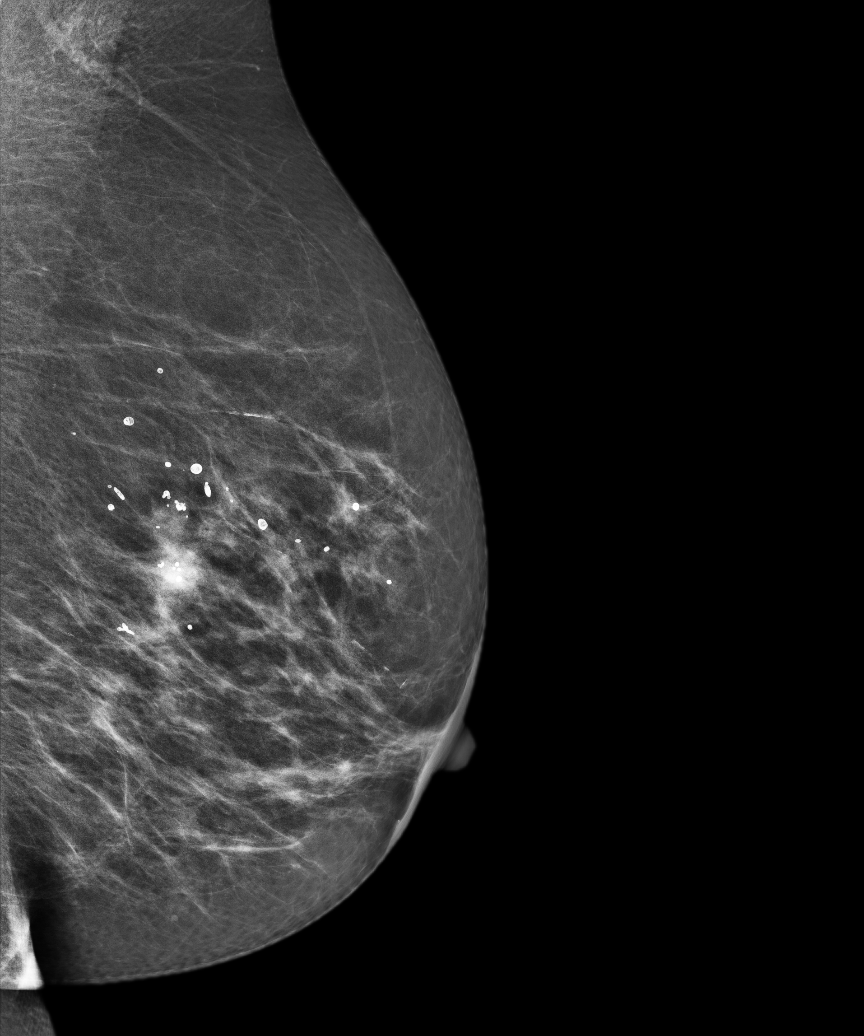
[im 5/6]
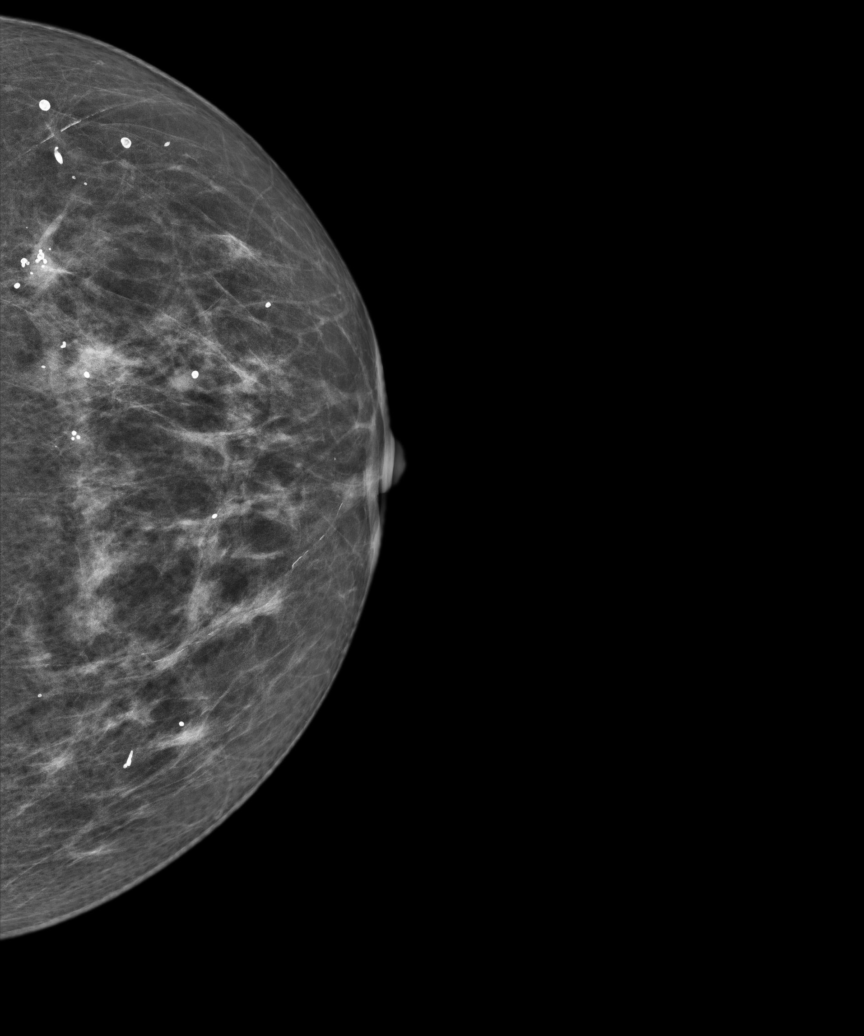
[im 6/6]
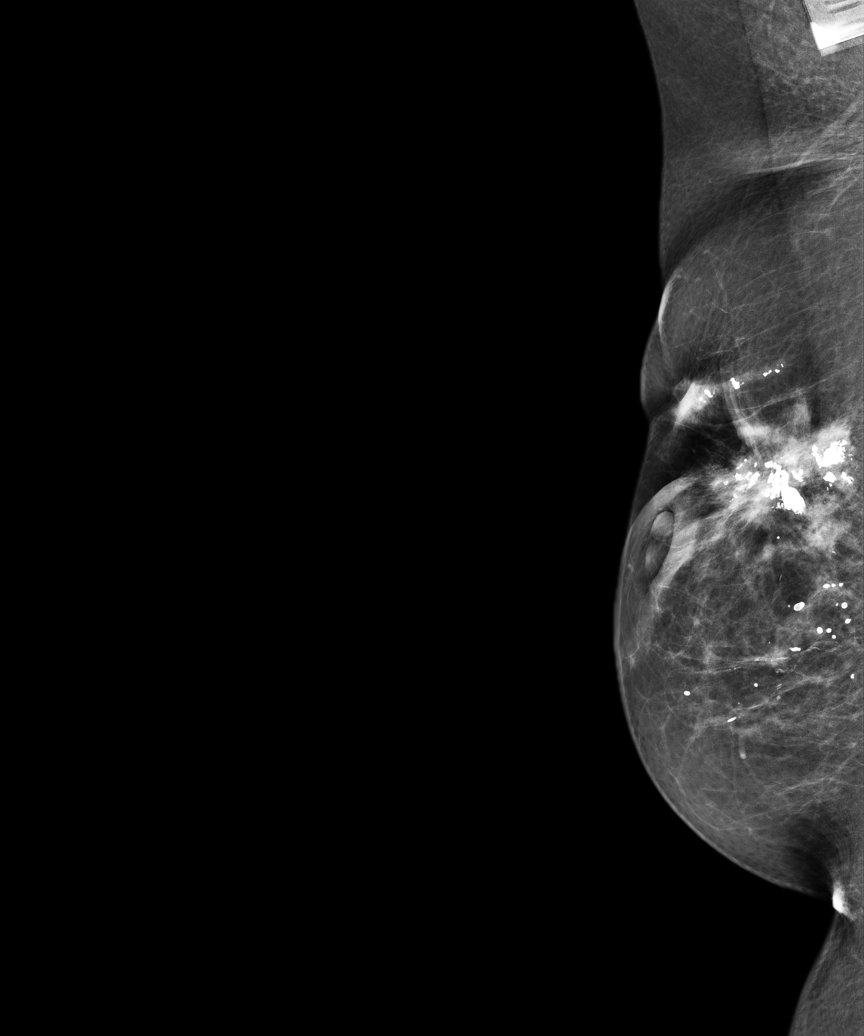

[6 of 6 positions shown; findings below may reference images not displayed]

FINDINGS: ACR Breast Density Category b:  There are scattered areas of
fibroglandular density.

There are no findings suspicious for malignancy.

Images were processed with CAD.
IMPRESSION: No mammographic evidence of malignancy.

A result letter of this screening mammogram will be mailed directly
to the patient.

RECOMMENDATION:
Screening mammogram in one year. (Code:YI-J-RAT)

BI-RADS CATEGORY 1:  Negative.

## 2014-12-09 ENCOUNTER — Ambulatory Visit: Payer: BLUE CROSS/BLUE SHIELD

## 2014-12-13 ENCOUNTER — Other Ambulatory Visit: Payer: Self-pay | Admitting: General Surgery

## 2014-12-13 ENCOUNTER — Ambulatory Visit
Admission: RE | Admit: 2014-12-13 | Discharge: 2014-12-13 | Disposition: A | Discharge status: Home / Self Care | Payer: Medicare Other | Source: Ambulatory Visit | Attending: General Surgery | Admitting: General Surgery

## 2014-12-13 DIAGNOSIS — Z1231 Encounter for screening mammogram for malignant neoplasm of breast: Secondary | ICD-10-CM | POA: Diagnosis not present

## 2014-12-13 HISTORY — DX: Malignant neoplasm of unspecified site of unspecified female breast: C50.919

## 2014-12-16 ENCOUNTER — Ambulatory Visit: Payer: Medicare Other | Admitting: General Surgery

## 2014-12-21 ENCOUNTER — Ambulatory Visit (INDEPENDENT_AMBULATORY_CARE_PROVIDER_SITE_OTHER): Payer: Medicare Other | Admitting: General Surgery

## 2014-12-21 ENCOUNTER — Encounter: Payer: Self-pay | Admitting: General Surgery

## 2014-12-21 VITALS — BP 100/58 | HR 68 | Resp 16 | Ht 65.0 in | Wt 171.0 lb

## 2014-12-21 DIAGNOSIS — Z853 Personal history of malignant neoplasm of breast: Secondary | ICD-10-CM | POA: Diagnosis not present

## 2014-12-21 NOTE — Patient Instructions (Addendum)
The patient has been asked to return to the office in one year with a bilateral screening mammogram. Continue monthly self breast exams. Call if any changes occur.

## 2014-12-21 NOTE — Progress Notes (Signed)
Patient ID: Angela David, female   DOB: 31-May-1940, 74 y.o.   MRN: [id number]  Chief Complaint  Patient presents with  . Follow-up    mammogram    HPI Angela David is a 74 y.o. female who presents for a breast cancer follow up . The most recent mammogram was done on 12/13/14 .  Patient does perform regular self breast checks and gets regular mammograms done. 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   HPI  Past Medical History  Diagnosis Date  . Glaucoma   . Personal history of malignant neoplasm of breast   . High cholesterol   . Sleep apnea 2011  . Cancer [facility]) 2002    right breast  . Breast cancer [facility]) 2002    right breast chemo and radiation    Past Surgical History  Procedure Laterality Date  . Abdominal hysterectomy    . Hydrocele excision / repair    . Colonoscopy  2009,12/30/2013  . Tracheal surgery  2013  . Breast surgery Right 2002    LUMPECTOMY, radiation, chemotherapy  . Tracheostomy N/A   . Breast biopsy Right 2008    neg  . Breast excisional biopsy Right 2002    positive    Family History  Problem Relation Age of Onset  . Cancer Father     bone cancer  . Cancer Sister   . Cancer Brother     Social History Social History  Substance Use Topics  . Smoking status: Never Smoker   . Smokeless tobacco: Never Used  . Alcohol Use: No    Allergies  Allergen Reactions  . Codeine     Per patient "it kept her up all night"  . Eggs Or Egg-Derived Products     respitatory distress   . Shellfish Allergy     respitatory distress    . Sodium Sulfite Other (See Comments)    Current Outpatient Prescriptions  Medication Sig Dispense Refill  . busPIRone (BUSPAR) 15 MG tablet Take 1 tablet by mouth 4 (four) times daily.    . butalbital-aspirin-caffeine (FIORINAL) 50-325-40 MG per capsule Take 1 capsule by mouth 2 (two) times daily as needed for headache. 14 capsule 0  . escitalopram (LEXAPRO) 10 MG tablet Take 20 mg by mouth  daily.    . ferrous sulfate 325 (65 FE) MG tablet Take 325 mg by mouth daily.    . folic acid (FOLVITE) 1 MG tablet Take 1 mg by mouth daily.    Marland Kitchen LORazepam (ATIVAN) 0.5 MG tablet Take 0.5 mg by mouth 3 (three) times daily.    Marland Kitchen lovastatin (MEVACOR) 40 MG tablet Take 1 tablet by mouth daily.    . metFORMIN (GLUCOPHAGE) 1000 MG tablet 1 tab by mouth in AM 1.5 tab by mouth in PM    . metoprolol tartrate (LOPRESSOR) 25 MG tablet Take 1 tablet by mouth 2 (two) times daily.    . pantoprazole (PROTONIX) 40 MG tablet Take 40 mg by mouth daily.     No current facility-administered medications for this visit.    Review of Systems Review of Systems  Constitutional: Negative.   Respiratory: Negative.   Cardiovascular: Negative.     Blood pressure 100/58, pulse 68, resp. rate 16, height 5\' 5"  (1.651 m), weight 171 lb (77.565 kg).  Physical Exam Physical Exam  Constitutional: She is oriented to person, place, and time. She appears well-developed and well-nourished.  Patient with a trach in place.   Eyes: Conjunctivae  are normal. No scleral icterus.  Neck: Neck supple.  Cardiovascular: Normal rate, regular rhythm and normal heart sounds.   Pulmonary/Chest: Effort normal and breath sounds normal. Right breast exhibits no inverted nipple, no mass, no nipple discharge, no skin change and no tenderness. Left breast exhibits no inverted nipple, no mass, no nipple discharge, no skin change and no tenderness.    Abdominal: Soft. Bowel sounds are normal. There is no tenderness.  Lymphadenopathy:    She has no cervical adenopathy.    She has no axillary adenopathy.  Neurological: She is alert and oriented to person, place, and time.  Skin: Skin is warm and dry.    Data Reviewed Mammogram reviewed, stable  Assessment    History of right breast cancer, no signs of recurrence.     Plan    The patient has been asked to return to the office in one year with a bilateral screening mammogram.         SANKAR,SEEPLAPUTHUR G 12/21/2014, 3:44 PM

## 2015-03-01 DIAGNOSIS — F3341 Major depressive disorder, recurrent, in partial remission: Secondary | ICD-10-CM | POA: Insufficient documentation

## 2015-03-28 ENCOUNTER — Encounter: Payer: Self-pay | Admitting: Podiatry

## 2015-03-28 ENCOUNTER — Ambulatory Visit (INDEPENDENT_AMBULATORY_CARE_PROVIDER_SITE_OTHER): Payer: Medicare Other

## 2015-03-28 ENCOUNTER — Ambulatory Visit (INDEPENDENT_AMBULATORY_CARE_PROVIDER_SITE_OTHER): Payer: Medicare Other | Admitting: Podiatry

## 2015-03-28 VITALS — BP 107/70 | HR 87 | Resp 16

## 2015-03-28 DIAGNOSIS — E139 Other specified diabetes mellitus without complications: Secondary | ICD-10-CM | POA: Insufficient documentation

## 2015-03-28 DIAGNOSIS — M79673 Pain in unspecified foot: Secondary | ICD-10-CM

## 2015-03-28 DIAGNOSIS — M10071 Idiopathic gout, right ankle and foot: Secondary | ICD-10-CM | POA: Diagnosis not present

## 2015-03-28 DIAGNOSIS — E7800 Pure hypercholesterolemia, unspecified: Secondary | ICD-10-CM | POA: Insufficient documentation

## 2015-03-28 DIAGNOSIS — M109 Gout, unspecified: Secondary | ICD-10-CM

## 2015-03-28 DIAGNOSIS — C50919 Malignant neoplasm of unspecified site of unspecified female breast: Secondary | ICD-10-CM | POA: Insufficient documentation

## 2015-03-28 NOTE — Progress Notes (Signed)
She presents today with 2 week duration of gout flareup to the first metatarsophalangeal joint of the right foot. Denies changes in her past history medications out of his surgeries or social history.  Objective: Vital signs are stable alert and oriented 3. Pulses are palpable. Red hot swollen first metatarsophalangeal joint no cellulitis just very broad hot erythema. This does not appear to be septic joint nor does it appear to be cellulitis. Radiographs taken today do not demonstrate any type of osseous abnormalities.  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  Plan: I injected the area today with Kenalog and local anesthetic did not write any medications and did not take any blood work. Recent blood work was negative for uric acid.

## 2015-04-15 ENCOUNTER — Ambulatory Visit: Payer: Medicare Other | Admitting: Sports Medicine

## 2015-06-29 ENCOUNTER — Ambulatory Visit (INDEPENDENT_AMBULATORY_CARE_PROVIDER_SITE_OTHER): Payer: Medicare Other | Admitting: Podiatry

## 2015-06-29 ENCOUNTER — Encounter: Payer: Self-pay | Admitting: Podiatry

## 2015-06-29 VITALS — BP 120/68 | HR 83 | Resp 16

## 2015-06-29 DIAGNOSIS — M79673 Pain in unspecified foot: Secondary | ICD-10-CM | POA: Diagnosis not present

## 2015-06-29 DIAGNOSIS — E119 Type 2 diabetes mellitus without complications: Secondary | ICD-10-CM

## 2015-06-29 DIAGNOSIS — L603 Nail dystrophy: Secondary | ICD-10-CM | POA: Diagnosis not present

## 2015-06-29 DIAGNOSIS — B351 Tinea unguium: Secondary | ICD-10-CM | POA: Diagnosis not present

## 2015-06-29 DIAGNOSIS — M79609 Pain in unspecified limb: Secondary | ICD-10-CM

## 2015-06-29 NOTE — Progress Notes (Signed)
She presents today stating that her hallux nail on her right foot is starting to turn loosing come off and she elected to have her nails trimmed. She is a diabetic and is afraid to cut them herself.  Objective: Vital signs are stable she is alert and oriented 3. Pulses are palpable. No signs of infection. 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 Hallux right does demonstrate a distal onycholysis but appears to be traumatic.  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  Plan: Debridement of toenails 1 through 5 bilateral due to diabetes.

## 2015-10-12 ENCOUNTER — Ambulatory Visit
Admission: RE | Admit: 2015-10-12 | Discharge: 2015-10-12 | Disposition: A | Discharge status: Home / Self Care | Payer: Medicare Other | Source: Ambulatory Visit | Attending: Internal Medicine | Admitting: Internal Medicine

## 2015-10-12 ENCOUNTER — Other Ambulatory Visit: Payer: Self-pay | Admitting: Internal Medicine

## 2015-10-12 DIAGNOSIS — M50321 Other cervical disc degeneration at C4-C5 level: Secondary | ICD-10-CM | POA: Insufficient documentation

## 2015-10-12 DIAGNOSIS — M545 Low back pain: Secondary | ICD-10-CM | POA: Diagnosis present

## 2015-10-12 DIAGNOSIS — M542 Cervicalgia: Secondary | ICD-10-CM

## 2015-10-12 DIAGNOSIS — M5136 Other intervertebral disc degeneration, lumbar region: Secondary | ICD-10-CM | POA: Insufficient documentation

## 2015-11-03 ENCOUNTER — Other Ambulatory Visit: Payer: Self-pay | Admitting: General Surgery

## 2015-11-03 DIAGNOSIS — Z1231 Encounter for screening mammogram for malignant neoplasm of breast: Secondary | ICD-10-CM

## 2015-11-10 ENCOUNTER — Encounter: Payer: Self-pay | Admitting: *Deleted

## 2015-11-16 ENCOUNTER — Telehealth: Payer: Self-pay | Admitting: General Surgery

## 2015-11-16 NOTE — Telephone Encounter (Signed)
PT CALLED & WOULD LIKE YOUR ASSISTANCE IN FINDING ANOTHER ONCOLOGIST.SHE CURRENTLY SEES DR Jeneen Rinks [facility] IN [place].SHE DOESN'T CARE FOR THE DRIVE ANY LONGER.PLEASE ADVISE SOMEONE SHE WILL FEEL COMFORTABLE WITH.SHE CAN BE REACHED @ [phone]

## 2015-11-17 NOTE — Telephone Encounter (Signed)
Notified patient that Dr Jamal Collin recommends her see [facility], Dr Rogue Bussing, pt agrees.

## 2015-12-15 ENCOUNTER — Ambulatory Visit
Admission: RE | Admit: 2015-12-15 | Discharge: 2015-12-15 | Disposition: A | Discharge status: Home / Self Care | Payer: Medicare Other | Source: Ambulatory Visit | Attending: General Surgery | Admitting: General Surgery

## 2015-12-15 ENCOUNTER — Encounter: Payer: Self-pay | Admitting: *Deleted

## 2015-12-15 DIAGNOSIS — Z1231 Encounter for screening mammogram for malignant neoplasm of breast: Secondary | ICD-10-CM | POA: Diagnosis not present

## 2015-12-15 HISTORY — DX: Personal history of irradiation: Z92.3

## 2015-12-15 HISTORY — DX: Personal history of antineoplastic chemotherapy: Z92.21

## 2015-12-21 ENCOUNTER — Ambulatory Visit (INDEPENDENT_AMBULATORY_CARE_PROVIDER_SITE_OTHER): Payer: Medicare Other | Admitting: General Surgery

## 2015-12-21 ENCOUNTER — Encounter: Payer: Self-pay | Admitting: General Surgery

## 2015-12-21 VITALS — BP 120/66 | HR 78 | Resp 14 | Ht 65.0 in | Wt 174.0 lb

## 2015-12-21 DIAGNOSIS — Z86010 Personal history of colonic polyps: Secondary | ICD-10-CM | POA: Diagnosis not present

## 2015-12-21 DIAGNOSIS — Z853 Personal history of malignant neoplasm of breast: Secondary | ICD-10-CM

## 2015-12-21 DIAGNOSIS — D751 Secondary polycythemia: Secondary | ICD-10-CM | POA: Diagnosis not present

## 2015-12-21 NOTE — Patient Instructions (Addendum)
The patient is aware to call back for any questions or concerns.  The patient has been asked to return to the office in one year with a bilateral screening mammogram 

## 2015-12-21 NOTE — Progress Notes (Signed)
Patient ID: Angela David, female   DOB: 01/29/1941, 75 y.o.   MRN: [id number]  Chief Complaint  Patient presents with  . Follow-up    mammogram    HPI Angela David is a 75 y.o. female.  who presents for a breast cancer follow up . The most recent mammogram was done on 12/15/15 .  Patient does perform regular self breast checks and gets regular mammograms done. 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  I have reviewed the history of present illness with the patient.  HPI  Past Medical History:  Diagnosis Date  . Breast cancer ([place]) 2002   right breast chemo and radiation  . Cancer [facility]) 2002   right breast  . Glaucoma   . High cholesterol   . History of colon polyps   . Personal history of chemotherapy 2002   BREAST CA  . Personal history of malignant neoplasm of breast   . Personal history of radiation therapy 2002   BREAST CA  . Sleep apnea 2011    Past Surgical History:  Procedure Laterality Date  . ABDOMINAL HYSTERECTOMY    . BREAST BIOPSY Right 2008   neg  . BREAST EXCISIONAL BIOPSY Right 2002   positive  . BREAST SURGERY Right 2002   LUMPECTOMY, radiation, chemotherapy  . COLONOSCOPY  2009,12/30/2013  . HYDROCELE EXCISION / REPAIR    . TRACHEAL SURGERY  2013  . TRACHEOSTOMY N/A     Family History  Problem Relation Age of Onset  . Cancer Father     bone cancer  . Cancer Sister   . Cancer Brother   . Breast cancer Neg Hx     Social History Social History  Substance Use Topics  . Smoking status: Never Smoker  . Smokeless tobacco: Never Used  . Alcohol use No    Allergies  Allergen Reactions  . Codeine     Per patient "it kept her up all night"  . Eggs Or Egg-Derived Products     respitatory distress   . Shellfish Allergy     respitatory distress    . Sodium Sulfite Other (See Comments)    Current Outpatient Prescriptions  Medication Sig Dispense Refill  . aspirin EC 81 MG tablet Take 81 mg by mouth.    .  Biotin 1000 MCG tablet Take by mouth.    . busPIRone (BUSPAR) 15 MG tablet Take 1 tablet by mouth 4 (four) times daily.    . Calcium Carbonate-Vitamin D3 (CALCIUM 600-D) 600-400 MG-UNIT TABS Take by mouth.    . cetirizine (ZYRTEC) 10 MG tablet Take 10 mg by mouth.    . escitalopram (LEXAPRO) 20 MG tablet Take 20 mg by mouth.    . ferrous sulfate 325 (65 FE) MG tablet Take 325 mg by mouth daily.    . folic acid (FOLVITE) 1 MG tablet Take 1 mg by mouth daily.    Marland Kitchen gabapentin (NEURONTIN) 100 MG capsule     . hydrochlorothiazide (HYDRODIURIL) 25 MG tablet     . LORazepam (ATIVAN) 0.5 MG tablet Take 1/2 pill (0.25 mg) in the morning, take 1 pill (0.5 mg) at night; may take an extra 1/2 pill (0.25 mg) once a day as needed for anxiety    . lovastatin (MEVACOR) 40 MG tablet Take 1 tablet by mouth daily.    . metFORMIN (GLUCOPHAGE) 1000 MG tablet 1 tab by mouth in AM 1.5 tab by mouth in PM    . metoprolol  tartrate (LOPRESSOR) 25 MG tablet Take 1 tablet by mouth 2 (two) times daily.    . pantoprazole (PROTONIX) 40 MG tablet Take 40 mg by mouth daily.    . pioglitazone (ACTOS) 15 MG tablet     . RESTASIS 0.05 % ophthalmic emulsion     . vitamin E 400 UNIT capsule Take by mouth.     No current facility-administered medications for this visit.     Review of Systems Review of Systems  Constitutional: Negative.   Respiratory: Negative.   Cardiovascular: Negative.     Blood pressure 120/66, pulse 78, resp. rate 14, height _0  (1.651 m), weight 174 lb (78.9 kg).  Physical Exam Physical Exam  Constitutional: She is oriented to person, place, and time. She appears well-developed and well-nourished.  Eyes: Conjunctivae are normal. No scleral icterus.  Neck: Neck supple. No thyromegaly present.  Cardiovascular: Normal rate, regular rhythm and normal heart sounds.   Pulmonary/Chest: Effort normal and breath sounds normal.  Abdominal: Soft. Bowel sounds are normal. She exhibits no distension and no  mass. There is no hepatomegaly. There is no tenderness.  Lymphadenopathy:    She has no cervical adenopathy.    She has no axillary adenopathy.  Neurological: She is alert and oriented to person, place, and time.  Skin: Skin is warm and dry.  Psychiatric: She has a normal mood and affect.    Data Reviewed Mammogram reviewed, stable  Assessment    History of right breast cancer, no signs of recurrence.    No recent oncology follow up-she has a tendency for high Hgb and red cell count.      Plan   CA 27-29, CEA, CBC and Met C requested The patient has been asked to return to the office in one year with a bilateral screening mammogram.     An appointment will also be scheduled for the patient to see Dr. Rogue Bussing ( hematology/oncology) on 12-27-15 at 2:30 pm ([place] location).     This information has been scribed by Karie Fetch RN, BSN,BC.    SANKAR,SEEPLAPUTHUR G 12/21/2015, 1:03 PM

## 2015-12-22 LAB — CBC WITH DIFFERENTIAL/PLATELET
BASOS ABS: 0 10*3/uL (ref 0.0–0.2)
BASOS: 0 %
EOS (ABSOLUTE): 0.1 10*3/uL (ref 0.0–0.4)
Eos: 1 %
Hematocrit: 42.4 % (ref 34.0–46.6)
Hemoglobin: 14.4 g/dL (ref 11.1–15.9)
Immature Grans (Abs): 0 10*3/uL (ref 0.0–0.1)
Immature Granulocytes: 0 %
LYMPHS ABS: 1.1 10*3/uL (ref 0.7–3.1)
Lymphs: 12 %
MCH: 29.5 pg (ref 26.6–33.0)
MCHC: 34 g/dL (ref 31.5–35.7)
MCV: 87 fL (ref 79–97)
Monocytes Absolute: 0.4 10*3/uL (ref 0.1–0.9)
Monocytes: 4 %
NEUTROS ABS: 7.4 10*3/uL — AB (ref 1.4–7.0)
Neutrophils: 83 %
PLATELETS: 241 10*3/uL (ref 150–379)
RBC: 4.88 x10E6/uL (ref 3.77–5.28)
RDW: 12.5 % (ref 12.3–15.4)
WBC: 9 10*3/uL (ref 3.4–10.8)

## 2015-12-22 LAB — BASIC METABOLIC PANEL
BUN / CREAT RATIO: 17 (ref 12–28)
BUN: 17 mg/dL (ref 8–27)
CHLORIDE: 98 mmol/L (ref 96–106)
CO2: 25 mmol/L (ref 18–29)
CREATININE: 0.99 mg/dL (ref 0.57–1.00)
Calcium: 10.2 mg/dL (ref 8.7–10.3)
GFR calc non Af Amer: 57 mL/min/{1.73_m2} — ABNORMAL LOW (ref >59)
GFR, EST AFRICAN AMERICAN: 66 mL/min/{1.73_m2} (ref >59)
GLUCOSE: 130 mg/dL — AB (ref 65–99)
Potassium: 4.4 mmol/L (ref 3.5–5.2)
SODIUM: 141 mmol/L (ref 134–144)

## 2015-12-22 LAB — CANCER ANTIGEN 27.29: CA 27.29: 37.5 U/mL (ref 0.0–38.6)

## 2015-12-22 LAB — CEA: CEA: 11.4 ng/mL — ABNORMAL HIGH (ref 0.0–4.7)

## 2015-12-27 ENCOUNTER — Encounter: Payer: Self-pay | Admitting: Internal Medicine

## 2015-12-27 ENCOUNTER — Inpatient Hospital Stay: Payer: Medicare Other | Attending: Internal Medicine | Admitting: Internal Medicine

## 2015-12-27 DIAGNOSIS — Z930 Tracheostomy status: Secondary | ICD-10-CM

## 2015-12-27 DIAGNOSIS — Z7982 Long term (current) use of aspirin: Secondary | ICD-10-CM

## 2015-12-27 DIAGNOSIS — J3800 Paralysis of vocal cords and larynx, unspecified: Secondary | ICD-10-CM | POA: Insufficient documentation

## 2015-12-27 DIAGNOSIS — M19012 Primary osteoarthritis, left shoulder: Secondary | ICD-10-CM | POA: Diagnosis not present

## 2015-12-27 DIAGNOSIS — M19011 Primary osteoarthritis, right shoulder: Secondary | ICD-10-CM | POA: Diagnosis not present

## 2015-12-27 DIAGNOSIS — G4730 Sleep apnea, unspecified: Secondary | ICD-10-CM | POA: Diagnosis not present

## 2015-12-27 DIAGNOSIS — Z86010 Personal history of colonic polyps: Secondary | ICD-10-CM | POA: Diagnosis not present

## 2015-12-27 DIAGNOSIS — Z853 Personal history of malignant neoplasm of breast: Secondary | ICD-10-CM | POA: Diagnosis not present

## 2015-12-27 DIAGNOSIS — Z9221 Personal history of antineoplastic chemotherapy: Secondary | ICD-10-CM | POA: Diagnosis not present

## 2015-12-27 DIAGNOSIS — E1140 Type 2 diabetes mellitus with diabetic neuropathy, unspecified: Secondary | ICD-10-CM | POA: Diagnosis not present

## 2015-12-27 DIAGNOSIS — C50811 Malignant neoplasm of overlapping sites of right female breast: Secondary | ICD-10-CM

## 2015-12-27 DIAGNOSIS — Z79899 Other long term (current) drug therapy: Secondary | ICD-10-CM | POA: Insufficient documentation

## 2015-12-27 DIAGNOSIS — Z923 Personal history of irradiation: Secondary | ICD-10-CM | POA: Diagnosis not present

## 2015-12-27 DIAGNOSIS — R978 Other abnormal tumor markers: Secondary | ICD-10-CM

## 2015-12-27 DIAGNOSIS — D751 Secondary polycythemia: Secondary | ICD-10-CM

## 2015-12-27 DIAGNOSIS — R970 Elevated carcinoembryonic antigen [CEA]: Secondary | ICD-10-CM

## 2015-12-27 DIAGNOSIS — E7800 Pure hypercholesterolemia, unspecified: Secondary | ICD-10-CM | POA: Diagnosis not present

## 2015-12-27 DIAGNOSIS — Z171 Estrogen receptor negative status [ER-]: Secondary | ICD-10-CM

## 2015-12-27 DIAGNOSIS — Z7984 Long term (current) use of oral hypoglycemic drugs: Secondary | ICD-10-CM

## 2015-12-27 DIAGNOSIS — K5900 Constipation, unspecified: Secondary | ICD-10-CM | POA: Insufficient documentation

## 2015-12-27 NOTE — Progress Notes (Signed)
Patient here for initial referral. Feeling extremely weak unable to perform activities of daily living. Having shoulder/upper back pain during activities.

## 2015-12-27 NOTE — Assessment & Plan Note (Addendum)
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 Clinically no evidence of recurrence.  # Elevated Tumor Marker- CEA 11- discussed both malignant and benign causes of elevated tumor marker. Clinically not concerning for any malignancy- discussed regarding getting CT scans now versus waiting 2 months. Patient interested in getting CT scan in 2 months if the tumor marker does not improve.  # Erythrocytosis- likely artifactual; recent hemoglobin 12.5 hematocrit 40. No further workup is necessary at this time.  # follow up in 10 weeks; labs 8 weeks. If CEA rising/ not improving will get CT scans. Pt agrees.  Thank you Dr. Jamal Collin for allowing me to participate in the care of your pleasant patient. Please do not hesitate to contact me with questions or concerns in the interim.  # 45 minutes face-to-face with the patient discussing the above plan of care; more than 50% of time spent on prognosis/ natural history; counseling and coordination.

## 2015-12-27 NOTE — Progress Notes (Signed)
-Angela David CONSULT NOTE  Patient Care Team: Albina Billet, MD as PCP - General (Unknown Physician Specialty) Christene Lye, MD (General Surgery) Albina Billet, MD (Internal Medicine)  CHIEF COMPLAINTS/PURPOSE OF CONSULTATION:  Breast cancer/ erythrocytosis  #  Oncology History   # 2002- Right Breast ? Stage II lumpec [Dr.sankar] & chemo [AC x4; ? Taxol; Dr.Hathorne; [facility]] s/p RT [[place]]  # Elevated CEA [facility] Nov 2015- N; Dr.Sanker]  # ? 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  female with above history of right breast cancer- 2002; most recently was evaluated by Dr. Jamal Collin. It was noted patient had elevated hemoglobin/hematocrit. She has been referred to [place] for further work up. Patient dictates that she wants to move her care for her breast cancer locally.  Patient has chronic bilateral shoulder arthritis not any worse. She denies any weight loss. Denies any unusual bone pain. She is chronic difficulty walking/neuropathy from her diabetes; previous chemotherapy. Denies any blood in stools or black stools. Denies any abdominal pain. She is chronic intermittent constipation. Not any worse. Last colonoscopy was in November 2015 small polyps taken out.  ROS: A complete 10 point review of system is done which is negative except mentioned above in history of present illness  MEDICAL HISTORY:  Past Medical History:  Diagnosis Date  . Breast cancer ([place]) 2002   right breast chemo and radiation  . Cancer [facility]) 2002   right breast  . Glaucoma   . High cholesterol   . History of colon polyps   . Personal history of chemotherapy 2002   BREAST CA  . Personal history of  malignant neoplasm of breast   . Personal history of radiation therapy 2002   BREAST CA  . Sleep apnea 2011    SURGICAL HISTORY: Past Surgical History:  Procedure Laterality Date  . ABDOMINAL HYSTERECTOMY    . BREAST BIOPSY Right 2008   neg  . BREAST EXCISIONAL BIOPSY Right 2002   positive  . BREAST SURGERY Right 2002   LUMPECTOMY, radiation, chemotherapy  . COLONOSCOPY  2009,12/30/2013  . HYDROCELE EXCISION / REPAIR    . TRACHEAL SURGERY  2013  . TRACHEOSTOMY N/A     SOCIAL HISTORY: No smoking. No alcohol. Lives with her husband. Social History   Social History  . Marital status: Married    Spouse name: N/A  . Number of children: N/A  . Years of education: N/A   Occupational History  . Not on file.   Social History Main Topics  . Smoking status: Never Smoker  . Smokeless tobacco: Never Used  . Alcohol use No  . Drug use: No  . Sexual activity: Not on file   Other Topics Concern  . Not on file   Social History Narrative  . No narrative on file    FAMILY HISTORY: Family History  Problem Relation Age of Onset  . Cancer Father     bone cancer  . Cancer Sister   . Cancer Brother   . Breast cancer Neg Hx     ALLERGIES:  is allergic to codeine; eggs or egg-derived products; shellfish allergy; and sodium sulfite.  MEDICATIONS:  Current Outpatient Prescriptions  Medication Sig Dispense Refill  . aspirin EC 81 MG tablet Take 81 mg by mouth.    . Biotin 1000 MCG tablet Take by mouth.    . busPIRone (BUSPAR) 15 MG tablet Take 1 tablet by mouth 4 (four) times daily.    . Calcium Carbonate-Vitamin D3 (CALCIUM 600-D) 600-400 MG-UNIT TABS Take by mouth.    . cetirizine (ZYRTEC) 10 MG tablet Take 10 mg by mouth.    . escitalopram (LEXAPRO) 20 MG tablet Take 20 mg by mouth.    . ferrous sulfate 325 (65 FE) MG tablet Take 325 mg by mouth daily.    . folic acid (FOLVITE) 1 MG tablet Take 1 mg by mouth daily.    Marland Kitchen gabapentin (NEURONTIN) 100 MG capsule     .  hydrochlorothiazide (HYDRODIURIL) 25 MG tablet     . LORazepam (ATIVAN) 0.5 MG tablet Take 1/2 pill (0.25 mg) in the morning, take 1 pill (0.5 mg) at night; may take an extra 1/2 pill (0.25 mg) once a day as needed for anxiety    . lovastatin (MEVACOR) 40 MG tablet Take 1 tablet by mouth daily.    . metFORMIN (GLUCOPHAGE) 1000 MG tablet 1 tab by mouth in AM 1.5 tab by mouth in PM    . metoprolol tartrate (LOPRESSOR) 25 MG tablet Take 1 tablet by mouth 2 (two) times daily.    . pantoprazole (PROTONIX) 40 MG tablet Take 40 mg by mouth daily.    . pioglitazone (ACTOS) 15 MG tablet     . RESTASIS 0.05 % ophthalmic emulsion     . vitamin E 400 UNIT capsule Take by mouth.     No current facility-administered medications for this visit.       Marland Kitchen  PHYSICAL EXAMINATION: ECOG PERFORMANCE STATUS: 0 - Asymptomatic  Vitals:   12/27/15 1433  BP: 109/68  Pulse: 82  Temp: 97.3 F (36.3 C)   Filed Weights   12/27/15 1433  Weight: 174 lb (78.9 kg)    GENERAL: Well-nourished well-developed; Alert, no distress and comfortable. With her husband.  EYES: no pallor or icterus OROPHARYNX: no thrush or ulceration; good dentition  NECK: supple, no masses felt LYMPH:  no palpable lymphadenopathy in the cervical, axillary or inguinal regions LUNGS: clear to auscultation and  No wheeze or crackles HEART/CVS: regular rate & rhythm and no murmurs; No lower extremity edema ABDOMEN: abdomen soft, non-tender and normal bowel sounds Musculoskeletal:no cyanosis of digits and no clubbing  PSYCH: alert & oriented x 3 with fluent speech NEURO: no focal motor/sensory deficits SKIN:  no rashes or significant lesions  LABORATORY DATA:  I have reviewed the data as listed Lab Results  Component Value Date   WBC 9.0 12/21/2015   HGB 13.7 12/17/2011   HCT 42.4 12/21/2015   MCV 87 12/21/2015   PLT 241 12/21/2015    Recent Labs  12/21/15 1040  NA 141  K 4.4  CL 98  CO2 25  GLUCOSE 130*  BUN 17   CREATININE 0.99  CALCIUM 10.2  GFRNONAA 57*  GFRAA 66    RADIOGRAPHIC STUDIES: I have personally reviewed the radiological images as listed and agreed with the findings in the report. Mm Screening Breast Tomo Bilateral  Result Date: 12/15/2015 CLINICAL DATA:  Screening. EXAM: 2D DIGITAL SCREENING BILATERAL MAMMOGRAM WITH CAD AND ADJUNCT TOMO COMPARISON:  Previous exam(s). ACR Breast Density Category b: There are scattered areas of fibroglandular density. FINDINGS: There are no findings  suspicious for malignancy. Images were processed with CAD. IMPRESSION: No mammographic evidence of malignancy. A result letter of this screening mammogram will be mailed directly to the patient. RECOMMENDATION: Screening mammogram in one year. (Code:SM-B-01Y) BI-RADS CATEGORY  1: Negative. Electronically Signed   By: Ammie Ferrier M.D.   On: 12/15/2015 14:13    ASSESSMENT & PLAN:   Carcinoma of overlapping sites of right breast in female, estrogen receptor negative ([place]) # Right breast cancer stage II; ER/ PR her 2 neu- NEG. Clinically no evidence of recurrence.  # Elevated Tumor Marker- CEA 11- discussed both malignant and benign causes of elevated tumor marker. Clinically not concerning for any malignancy- discussed regarding getting CT scans now versus waiting 2 months. Patient interested in getting CT scan in 2 months if the tumor marker does not improve.  # Erythrocytosis- likely artifactual; recent hemoglobin 12.5 hematocrit 40. No further workup is necessary at this time.  # follow up in 10 weeks; labs 8 weeks. If CEA rising/ not improving will get CT scans. Pt agrees.  Thank you Dr. Jamal Collin for allowing me to participate in the care of your pleasant patient. Please do not hesitate to contact me with questions or concerns in the interim.  # 45 minutes face-to-face with the patient discussing the above plan of care; more than 50% of time spent on prognosis/ natural history; counseling and  coordination.  All questions were answered. The patient knows to call the clinic with any problems, questions or concerns.    Cammie Sickle, MD 12/27/2015 4:17 PM

## 2015-12-30 ENCOUNTER — Telehealth: Payer: Self-pay | Admitting: Internal Medicine

## 2015-12-30 NOTE — Telephone Encounter (Signed)
Pt takes prolia every 6 months and wants to know if she can get it in our facility. Please call pt and advise. Thank you.

## 2015-12-30 NOTE — Telephone Encounter (Signed)
Dr Rogue Bussing approved pt to come for Prolia injection; however, md would like to know when she received her last prolia injection to determine the timing ordering the injection.  Attempted to contact patient back. Home phone mail box number is not yet set up and cell phone vm is full.  I was able to leave a call back number on her cell phone.

## 2016-01-02 NOTE — Telephone Encounter (Signed)
Patient called again today (01/02/16) and informed me that her last Prolia shot was administered 06/08/15. When should she be scheduled for her next one? Thank you!

## 2016-01-02 NOTE — Telephone Encounter (Signed)
Per Dr Rogue Bussing, January 9th

## 2016-01-09 IMAGING — MG MM ADDITIONAL VIEWS AT NO CHARGE
2 series · 2 of 2 positions shown · non-contrast
Comparison: Priors

CLINICAL DATA: Patient recalled from screening for left breast
asymmetry

EXAM:
DIGITAL DIAGNOSTIC UNILATERAL LEFT MAMMOGRAM WITH 3D TOMOSYNTHESIS
AND CAD

[L CC]
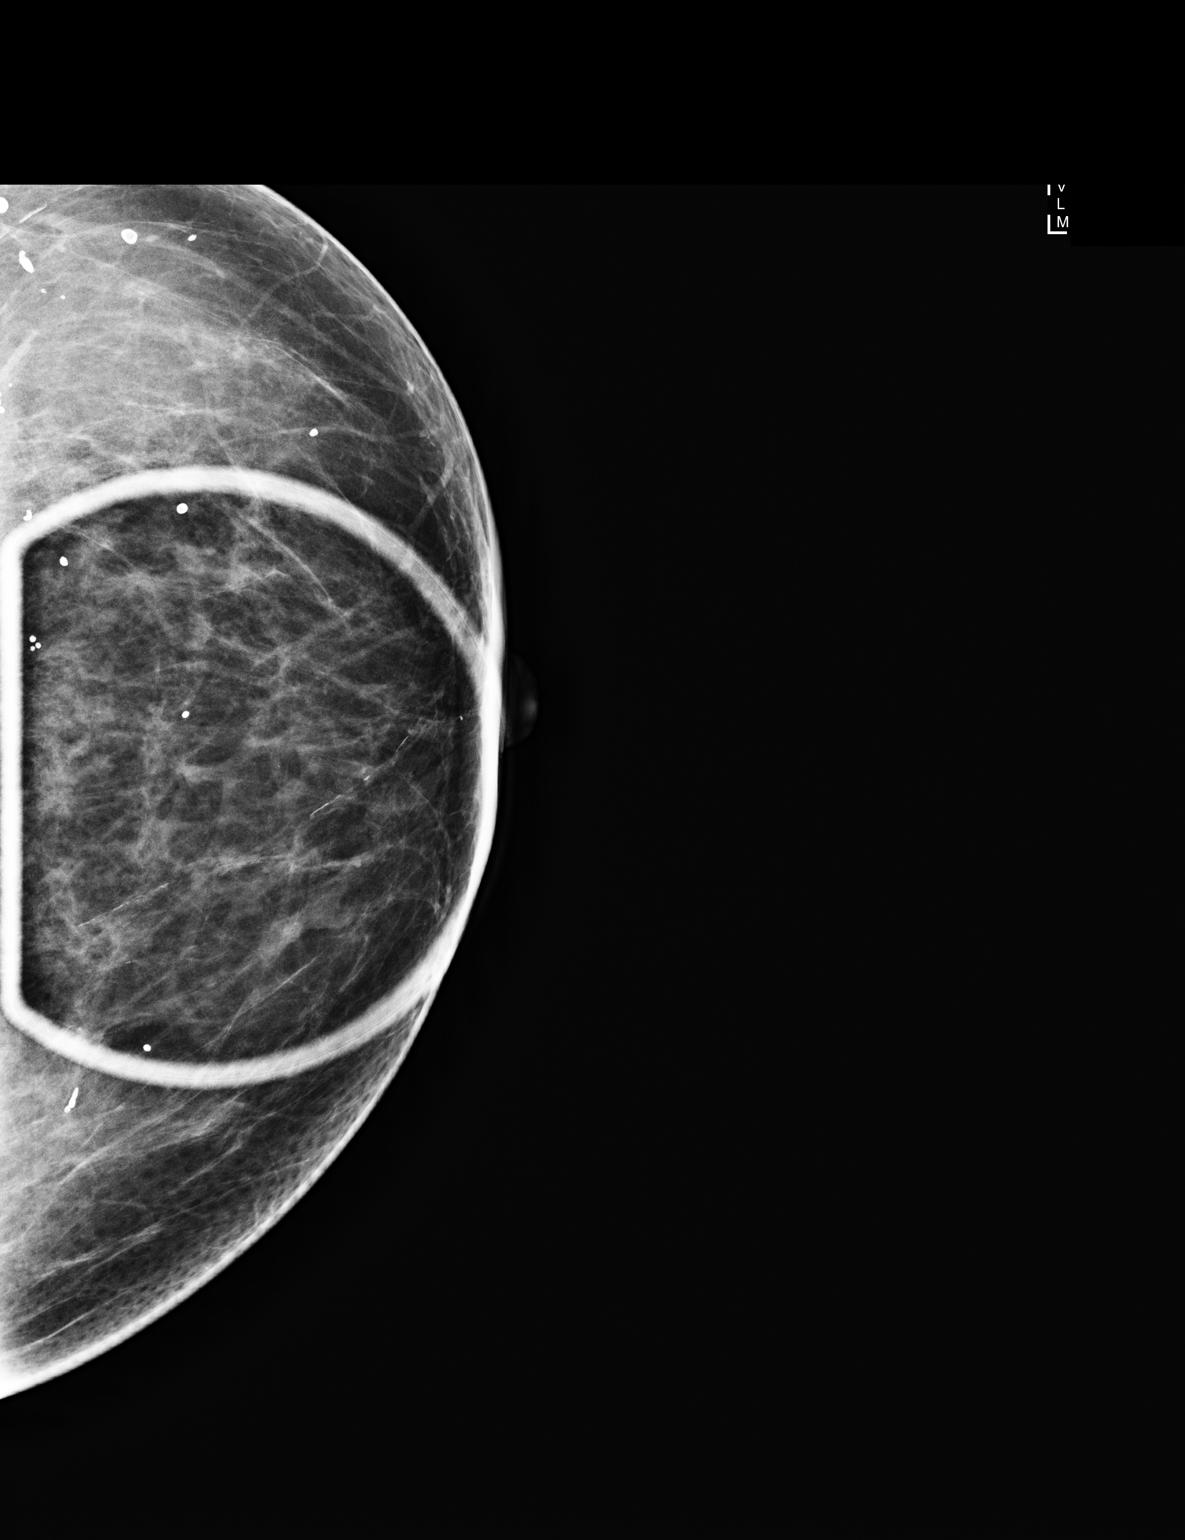

[L ML]
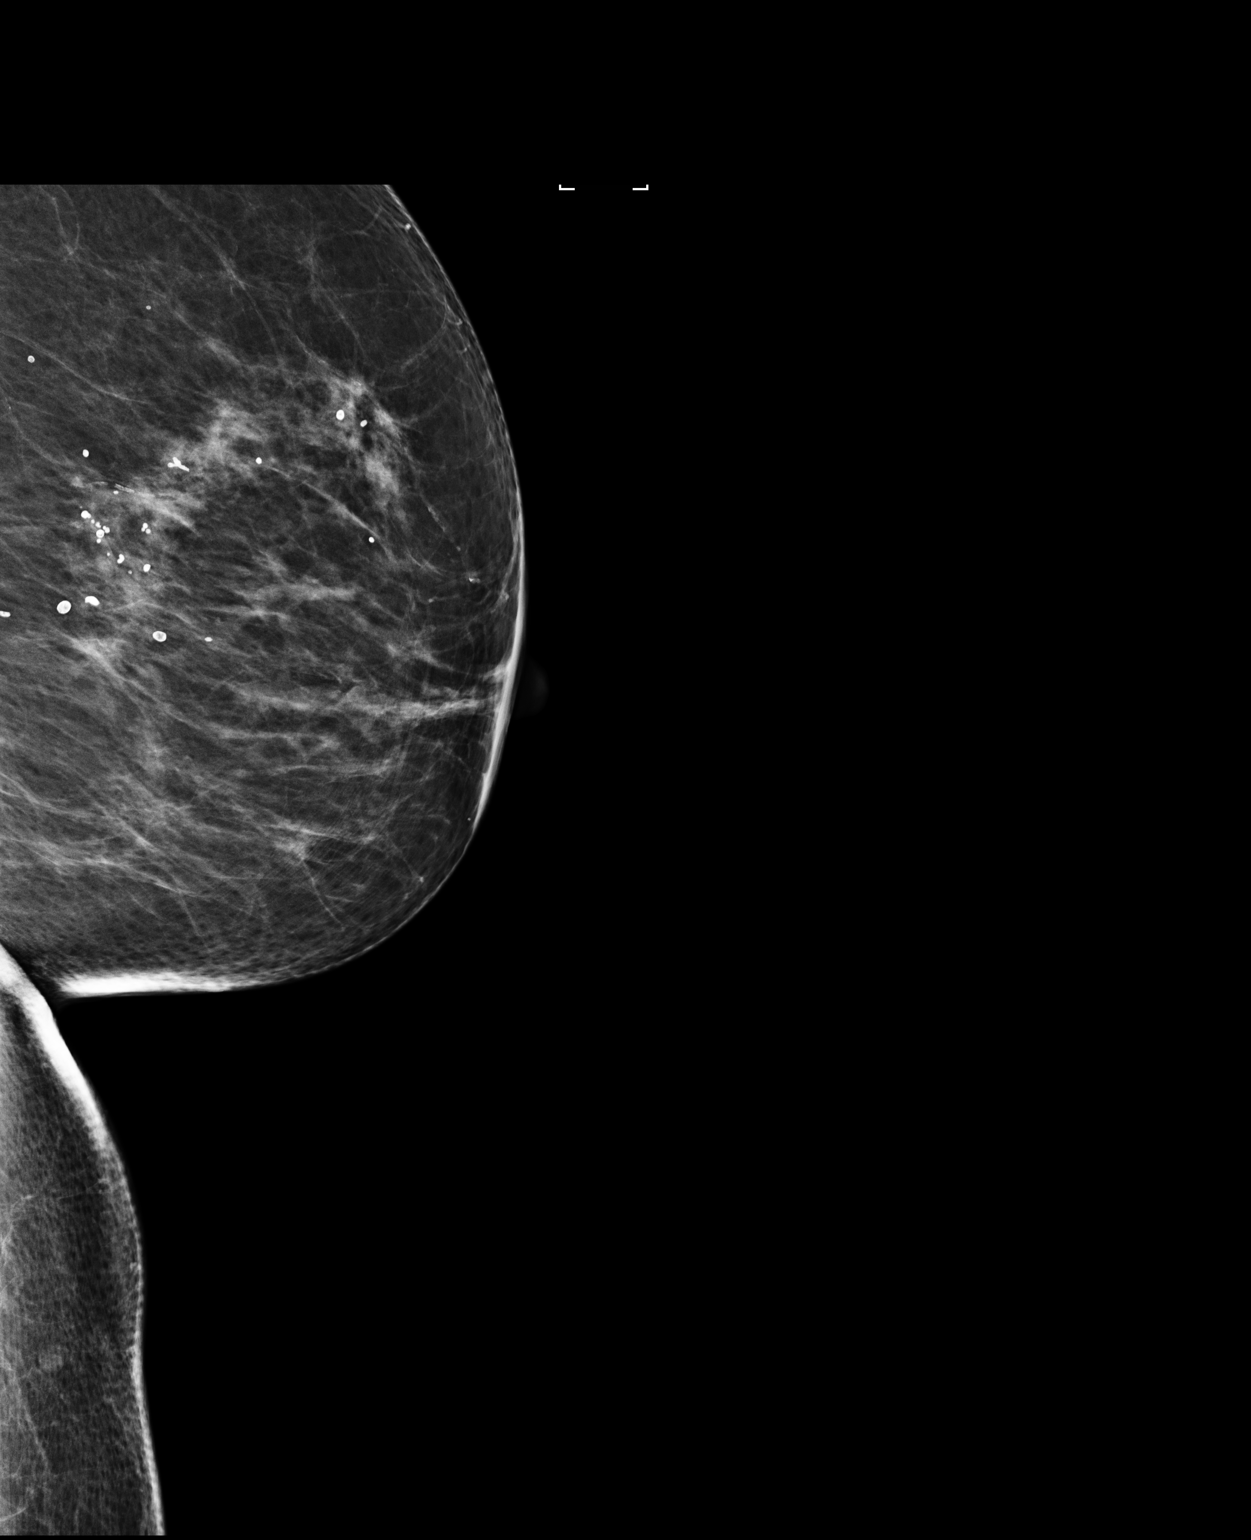

[2 of 2 positions shown; findings below may reference images not displayed]

ACR Breast Density Category b: There are scattered areas of
fibroglandular density.
FINDINGS: Questioned asymmetry within the medial aspect of the left breast on
the CC view resolved with additional imaging including tomosynthesis
and spot compression views.

Mammographic images were processed with CAD.
IMPRESSION: Questioned asymmetry within the medial left breast resolved,
compatible with overlapping fibroglandular breast tissue.

No mammographic evidence for malignancy.

RECOMMENDATION:
Screening mammogram in one year.(Code:7V-V-RC2)

I have discussed the findings and recommendations with the patient.
Results were also provided in writing at the conclusion of the
visit. If applicable, a reminder letter will be sent to the patient
regarding the next appointment.

BI-RADS CATEGORY  2: Benign.

## 2016-01-09 IMAGING — MG MM ADDITIONAL VIEWS AT NO CHARGE
3 series · 3 of 3 positions shown · non-contrast
Comparison: Priors

CLINICAL DATA: Patient recalled from screening for left breast
asymmetry

EXAM:
DIGITAL DIAGNOSTIC UNILATERAL LEFT MAMMOGRAM WITH 3D TOMOSYNTHESIS
AND CAD

[L CC]
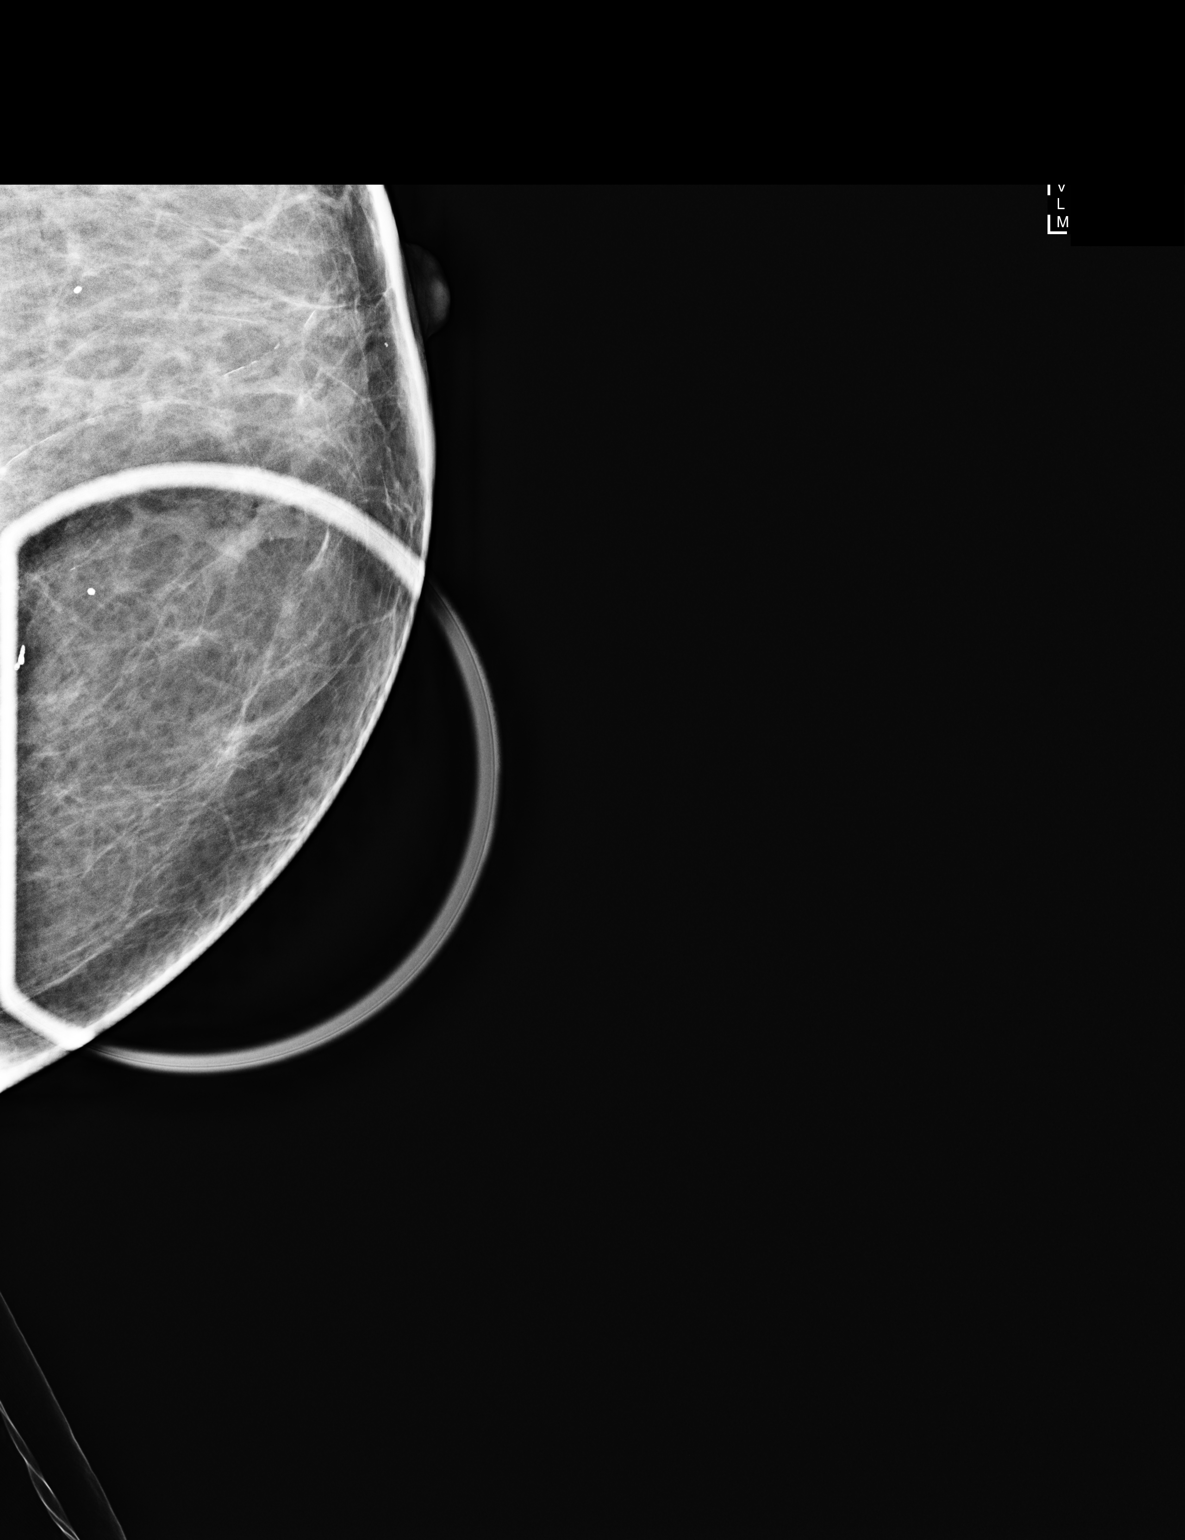

[L CC tomo]
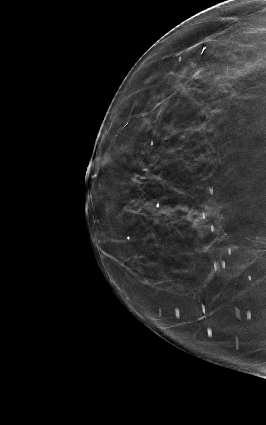

[L MLO tomo]
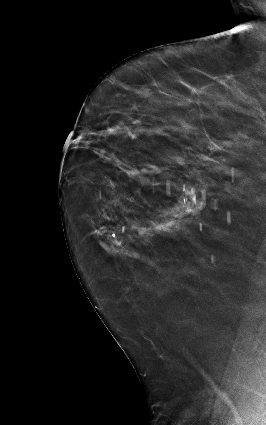

[3 of 3 positions shown; findings below may reference images not displayed]

ACR Breast Density Category b: There are scattered areas of
fibroglandular density.
FINDINGS: Questioned asymmetry within the medial aspect of the left breast on
the CC view resolved with additional imaging including tomosynthesis
and spot compression views.

Mammographic images were processed with CAD.
IMPRESSION: Questioned asymmetry within the medial left breast resolved,
compatible with overlapping fibroglandular breast tissue.

No mammographic evidence for malignancy.

RECOMMENDATION:
Screening mammogram in one year.(Code:7V-V-RC2)

I have discussed the findings and recommendations with the patient.
Results were also provided in writing at the conclusion of the
visit. If applicable, a reminder letter will be sent to the patient
regarding the next appointment.

BI-RADS CATEGORY  2: Benign.

## 2016-01-11 ENCOUNTER — Other Ambulatory Visit: Payer: Self-pay | Admitting: Internal Medicine

## 2016-01-11 ENCOUNTER — Telehealth: Payer: Self-pay | Admitting: *Deleted

## 2016-01-11 DIAGNOSIS — M810 Age-related osteoporosis without current pathological fracture: Secondary | ICD-10-CM

## 2016-01-11 NOTE — Telephone Encounter (Signed)
Telephone Open    12/30/2015 [facility] [place]  Cammie Sickle, MD  Oncology   [facility]  (Newest Message First)  January 02, 2016  Me  to Angela David     4:13 PM  Note    Per Dr Rogue Bussing, January 9th          3:50 PM  Angela David routed this conversation to Sabino Gasser, RN . Luretha Murphy, [place] . Cammie Sickle, MD . Me  Angela David      3:49 PM  Note    Patient called again today (01/02/16) and informed me that her last Prolia shot was administered 06/08/15. When should she be scheduled for her next one? Thank you!          3:49

## 2016-01-11 NOTE — Telephone Encounter (Signed)
-----   Message from Cephus Richer sent at 01/11/2016  3:12 PM EST ----- Contact: [phone] Pt wants to know when can she get her prolia inj. Pt states she needs it now.

## 2016-01-11 NOTE — Telephone Encounter (Signed)
Set pt up for prolia this week/next; order bmp. Thx

## 2016-01-12 ENCOUNTER — Other Ambulatory Visit: Payer: Self-pay | Admitting: *Deleted

## 2016-01-12 DIAGNOSIS — R978 Other abnormal tumor markers: Secondary | ICD-10-CM

## 2016-01-18 ENCOUNTER — Ambulatory Visit: Payer: Medicare Other

## 2016-01-18 ENCOUNTER — Inpatient Hospital Stay: Payer: Medicare Other | Attending: Internal Medicine

## 2016-01-18 ENCOUNTER — Inpatient Hospital Stay: Payer: Medicare Other

## 2016-01-18 ENCOUNTER — Other Ambulatory Visit: Payer: Medicare Other

## 2016-01-18 DIAGNOSIS — M810 Age-related osteoporosis without current pathological fracture: Secondary | ICD-10-CM

## 2016-01-18 DIAGNOSIS — C50811 Malignant neoplasm of overlapping sites of right female breast: Secondary | ICD-10-CM

## 2016-01-18 DIAGNOSIS — Z79899 Other long term (current) drug therapy: Secondary | ICD-10-CM | POA: Diagnosis not present

## 2016-01-18 DIAGNOSIS — Z853 Personal history of malignant neoplasm of breast: Secondary | ICD-10-CM | POA: Insufficient documentation

## 2016-01-18 DIAGNOSIS — R978 Other abnormal tumor markers: Secondary | ICD-10-CM

## 2016-01-18 DIAGNOSIS — Z171 Estrogen receptor negative status [ER-]: Secondary | ICD-10-CM

## 2016-01-18 LAB — BASIC METABOLIC PANEL
Anion gap: 9 (ref 5–15)
BUN: 18 mg/dL (ref 6–20)
CHLORIDE: 100 mmol/L — AB (ref 101–111)
CO2: 27 mmol/L (ref 22–32)
CREATININE: 1 mg/dL (ref 0.44–1.00)
Calcium: 10.1 mg/dL (ref 8.9–10.3)
GFR calc non Af Amer: 55 mL/min — ABNORMAL LOW (ref >60)
GLUCOSE: 153 mg/dL — AB (ref 65–99)
Potassium: 4 mmol/L (ref 3.5–5.1)
Sodium: 136 mmol/L (ref 135–145)

## 2016-01-18 MED ORDER — DENOSUMAB 60 MG/ML ~~LOC~~ SOLN
60.0000 mg | Freq: Once | SUBCUTANEOUS | Status: AC
  Administered 2016-01-18: 60 mg via SUBCUTANEOUS
  Filled 2016-01-18: qty 1

## 2016-01-23 ENCOUNTER — Telehealth: Payer: Self-pay | Admitting: Internal Medicine

## 2016-01-23 NOTE — Telephone Encounter (Signed)
Pt would like nurse to please call with lab results when she has time. Thanks.

## 2016-01-24 NOTE — Telephone Encounter (Signed)
Notified pt of results, pt verbalized understanding.  

## 2016-01-30 ENCOUNTER — Telehealth: Payer: Self-pay | Admitting: *Deleted

## 2016-01-30 DIAGNOSIS — Z171 Estrogen receptor negative status [ER-]: Secondary | ICD-10-CM

## 2016-01-30 DIAGNOSIS — M898X9 Other specified disorders of bone, unspecified site: Secondary | ICD-10-CM

## 2016-01-30 DIAGNOSIS — C50811 Malignant neoplasm of overlapping sites of right female breast: Secondary | ICD-10-CM

## 2016-01-30 NOTE — Telephone Encounter (Signed)
Patient called. Asked RN to contact her back.  atempted to contact pt's via home phone #, which patient left on vm.  No answer-Home vm not set up   I contacted the patient on her cell:  [phone]. Was able to reach patient at [phone].  Pt reports back pain "worsening back pain since my last Prolia injection."  "I stayed in the bed all weekend b/c it hurt that bad. I just don't feel steady on my feet anymore.  I've been reading information up on the prolia. I wonder if my calcium is low and this is what needs to be checked in case this level is low."  Pt reports that she is taking extra strength Tylenol 500 mg 2 tablets in am and 2 tabs in pm.  "pain feels like a constant stabbing sensation in the between the shoulder and back.  It gets better when I lie down but hurts when I am moving my extremities."  patient "taking calcium 600- 400 - takes 2  tablets twice daily." she had the prolia injection in April without any side effects.

## 2016-01-31 NOTE — Telephone Encounter (Signed)
Spoke with Dr. Rogue Bussing - md would like a bmp to be checked this week to review electrolytes. Pt agreeable to come afternoon for lab check at 145 pm. msg sent to cancer center sch. Team to arrange. Lab orders entered.

## 2016-01-31 NOTE — Addendum Note (Signed)
Addended by: Sabino Gasser on: 01/31/2016 02:26 PM   Modules accepted: Orders

## 2016-02-01 ENCOUNTER — Encounter: Payer: Self-pay | Admitting: *Deleted

## 2016-02-01 ENCOUNTER — Inpatient Hospital Stay: Payer: Medicare Other | Attending: Internal Medicine

## 2016-02-01 DIAGNOSIS — Z171 Estrogen receptor negative status [ER-]: Secondary | ICD-10-CM | POA: Diagnosis not present

## 2016-02-01 DIAGNOSIS — C50811 Malignant neoplasm of overlapping sites of right female breast: Secondary | ICD-10-CM | POA: Diagnosis not present

## 2016-02-01 DIAGNOSIS — M898X9 Other specified disorders of bone, unspecified site: Secondary | ICD-10-CM

## 2016-02-01 LAB — BASIC METABOLIC PANEL
ANION GAP: 10 (ref 5–15)
BUN: 25 mg/dL — AB (ref 6–20)
CALCIUM: 10.2 mg/dL (ref 8.9–10.3)
CO2: 26 mmol/L (ref 22–32)
Chloride: 100 mmol/L — ABNORMAL LOW (ref 101–111)
Creatinine, Ser: 0.96 mg/dL (ref 0.44–1.00)
GFR calc Af Amer: >60 mL/min (ref >60)
GFR calc non Af Amer: 57 mL/min — ABNORMAL LOW (ref >60)
GLUCOSE: 140 mg/dL — AB (ref 65–99)
Potassium: 4.3 mmol/L (ref 3.5–5.1)
Sodium: 136 mmol/L (ref 135–145)

## 2016-02-01 NOTE — Progress Notes (Signed)
Patient came by the cancer center today for lab only. The patient requested to see RN to discuss her joint pain.  - requesting something to be provided for joint pain.  Spoke with provider calcium level is normal. No need for any additional imaging at this time.  MD advised pt to take aleve 220 mg 1 tablet every 12 hrs for pain.  Patient currently not taking any medication for pain. She inquired if she could use otc lidocaine topical gel. She is using the heating pad as well. I advised pt not to apply heating pad after applying the lidocaine topical gels. She gave verbal understanding- teach back process performed with the patient.  She also asked if she could speak to her former orthopedic for a consult regarding her shoulder pain. I explained to the patient and her husband that the orthopedic appointment would be fine if this is what the patient wanted to do. Pt reports that she feels that the orthopedic provider should evaluate her shoulder pain.

## 2016-02-22 ENCOUNTER — Inpatient Hospital Stay: Payer: Medicare Other

## 2016-02-22 DIAGNOSIS — R978 Other abnormal tumor markers: Secondary | ICD-10-CM

## 2016-02-22 DIAGNOSIS — C50811 Malignant neoplasm of overlapping sites of right female breast: Secondary | ICD-10-CM | POA: Diagnosis not present

## 2016-02-22 DIAGNOSIS — Z171 Estrogen receptor negative status [ER-]: Secondary | ICD-10-CM

## 2016-02-22 LAB — COMPREHENSIVE METABOLIC PANEL
ALBUMIN: 4.2 g/dL (ref 3.5–5.0)
ALK PHOS: 75 U/L (ref 38–126)
ALT: 23 U/L (ref 14–54)
AST: 24 U/L (ref 15–41)
Anion gap: 10 (ref 5–15)
BILIRUBIN TOTAL: 0.7 mg/dL (ref 0.3–1.2)
BUN: 27 mg/dL — AB (ref 6–20)
CALCIUM: 9.6 mg/dL (ref 8.9–10.3)
CO2: 27 mmol/L (ref 22–32)
CREATININE: 0.95 mg/dL (ref 0.44–1.00)
Chloride: 97 mmol/L — ABNORMAL LOW (ref 101–111)
GFR calc Af Amer: >60 mL/min (ref >60)
GFR calc non Af Amer: 58 mL/min — ABNORMAL LOW (ref >60)
GLUCOSE: 148 mg/dL — AB (ref 65–99)
Potassium: 4.1 mmol/L (ref 3.5–5.1)
Sodium: 134 mmol/L — ABNORMAL LOW (ref 135–145)
TOTAL PROTEIN: 8 g/dL (ref 6.5–8.1)

## 2016-02-22 LAB — CBC WITH DIFFERENTIAL/PLATELET
BASOS ABS: 0.1 10*3/uL (ref 0–0.1)
BASOS PCT: 1 %
Eosinophils Absolute: 0.3 10*3/uL (ref 0–0.7)
Eosinophils Relative: 2 %
HEMATOCRIT: 43 % (ref 35.0–47.0)
HEMOGLOBIN: 14.4 g/dL (ref 12.0–16.0)
LYMPHS PCT: 15 %
Lymphs Abs: 1.6 10*3/uL (ref 1.0–3.6)
MCH: 28.9 pg (ref 26.0–34.0)
MCHC: 33.6 g/dL (ref 32.0–36.0)
MCV: 86.2 fL (ref 80.0–100.0)
MONOS PCT: 5 %
Monocytes Absolute: 0.6 10*3/uL (ref 0.2–0.9)
NEUTROS ABS: 8.1 10*3/uL — AB (ref 1.4–6.5)
NEUTROS PCT: 77 %
Platelets: 187 10*3/uL (ref 150–440)
RBC: 4.99 MIL/uL (ref 3.80–5.20)
RDW: 12.3 % (ref 11.5–14.5)
WBC: 10.6 10*3/uL (ref 3.6–11.0)

## 2016-02-23 LAB — CEA: CEA: 8.1 ng/mL — AB (ref 0.0–4.7)

## 2016-03-06 ENCOUNTER — Ambulatory Visit: Payer: Medicare Other

## 2016-03-06 ENCOUNTER — Inpatient Hospital Stay: Payer: Medicare Other | Attending: Internal Medicine | Admitting: Internal Medicine

## 2016-03-06 DIAGNOSIS — E1140 Type 2 diabetes mellitus with diabetic neuropathy, unspecified: Secondary | ICD-10-CM | POA: Insufficient documentation

## 2016-03-06 DIAGNOSIS — Z86010 Personal history of colonic polyps: Secondary | ICD-10-CM | POA: Diagnosis not present

## 2016-03-06 DIAGNOSIS — M19012 Primary osteoarthritis, left shoulder: Secondary | ICD-10-CM | POA: Diagnosis not present

## 2016-03-06 DIAGNOSIS — Z7982 Long term (current) use of aspirin: Secondary | ICD-10-CM | POA: Diagnosis not present

## 2016-03-06 DIAGNOSIS — K5900 Constipation, unspecified: Secondary | ICD-10-CM

## 2016-03-06 DIAGNOSIS — Z171 Estrogen receptor negative status [ER-]: Secondary | ICD-10-CM

## 2016-03-06 DIAGNOSIS — G4730 Sleep apnea, unspecified: Secondary | ICD-10-CM | POA: Diagnosis not present

## 2016-03-06 DIAGNOSIS — Z923 Personal history of irradiation: Secondary | ICD-10-CM | POA: Insufficient documentation

## 2016-03-06 DIAGNOSIS — D751 Secondary polycythemia: Secondary | ICD-10-CM | POA: Insufficient documentation

## 2016-03-06 DIAGNOSIS — Z853 Personal history of malignant neoplasm of breast: Secondary | ICD-10-CM | POA: Diagnosis present

## 2016-03-06 DIAGNOSIS — Z9221 Personal history of antineoplastic chemotherapy: Secondary | ICD-10-CM

## 2016-03-06 DIAGNOSIS — M8580 Other specified disorders of bone density and structure, unspecified site: Secondary | ICD-10-CM | POA: Diagnosis not present

## 2016-03-06 DIAGNOSIS — Z7984 Long term (current) use of oral hypoglycemic drugs: Secondary | ICD-10-CM | POA: Insufficient documentation

## 2016-03-06 DIAGNOSIS — E7800 Pure hypercholesterolemia, unspecified: Secondary | ICD-10-CM

## 2016-03-06 DIAGNOSIS — R970 Elevated carcinoembryonic antigen [CEA]: Secondary | ICD-10-CM | POA: Diagnosis not present

## 2016-03-06 DIAGNOSIS — J3800 Paralysis of vocal cords and larynx, unspecified: Secondary | ICD-10-CM | POA: Diagnosis not present

## 2016-03-06 DIAGNOSIS — C50811 Malignant neoplasm of overlapping sites of right female breast: Secondary | ICD-10-CM

## 2016-03-06 DIAGNOSIS — Z79899 Other long term (current) drug therapy: Secondary | ICD-10-CM

## 2016-03-06 NOTE — Progress Notes (Signed)
x

## 2016-03-06 NOTE — Assessment & Plan Note (Addendum)
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 Clinically no evidence of recurrence.  # Elevated Tumor Marker- CEA 11 in October- Most recent CEA is 8.1(Dec, 2017). At last visit, we discussed possibility of repeat CT Scan but with CEA trending now, will hold on the scan for now. Will re-check labs in 4 months.   # Erythrocytosis- likely artifactual; Stable. Recent hemoglobin 14.4 hematocrit 43. No further workup is necessary at this time.  #Osteopenia- Dexa Scan 2016- T-score -1.4. Continue Prolia. Next injection is in 4 months. Coordinate with labs and MD appointment.   # follow up in 4 months with labs (CBC, CMET, CEA, [id number]) and MD and Prolia injection.     

## 2016-03-06 NOTE — Progress Notes (Signed)
-[place] CONSULT NOTE  Patient Care Team: Albina Billet, MD as PCP - General (Unknown Physician Specialty) Christene Lye, MD (General Surgery) Albina Billet, MD (Internal Medicine)  CHIEF COMPLAINTS/PURPOSE OF CONSULTATION:  Breast cancer/ erythrocytosis  #  Oncology History   # 2002- Right Breast ? Stage II lumpec [Dr.sankar] & chemo [AC x4; ? Taxol; Dr.Hathorne; [facility]] s/p RT [[place]]  # Elevated CEA [facility] Nov 2015- N; Dr.Sanker]  # ? 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  female with above history of right breast cancer- 2002; who was evaluated by Dr. Jamal Collin. Patient was noted to have had elevated hemoglobin/hematocrit and was referred to [place] for further work up.   Patients states that she has been feeling anxious over the last two weeks waiting on the results of CEA numbers. She states otherwise she has been feeling great. Patient has chronic bilateral shoulder arthritis not any worse. She denies any weight loss. Denies any unusual bone pain. She is chronic difficulty walking/neuropathy from her diabetes and chemotherapy. Denies any blood in stools or black stools. Denies any abdominal pain. She is chronic intermittent constipation. Not any worse. Most recent mammogram was in October 2017 BI-RADS CATEGORY 1. Last colonoscopy was in November 2015 with small polyps taken out.  ROS: A complete 10 point review of system is done which is negative except mentioned above in history of present illness  MEDICAL HISTORY:  Past Medical History:  Diagnosis Date  . Breast cancer ([place]) 2002   right breast chemo and radiation  . Cancer [facility]) 2002   right breast  . Glaucoma   .  High cholesterol   . History of colon polyps   . Personal history of chemotherapy 2002   BREAST CA  . Personal history of malignant neoplasm of breast   . Personal history of radiation therapy 2002   BREAST CA  . Sleep apnea 2011    SURGICAL HISTORY: Past Surgical History:  Procedure Laterality Date  . ABDOMINAL HYSTERECTOMY    . BREAST BIOPSY Right 2008   neg  . BREAST EXCISIONAL BIOPSY Right 2002   positive  . BREAST SURGERY Right 2002   LUMPECTOMY, radiation, chemotherapy  . COLONOSCOPY  2009,12/30/2013  . HYDROCELE EXCISION / REPAIR    . TRACHEAL SURGERY  2013  . TRACHEOSTOMY N/A     SOCIAL HISTORY: No smoking. No alcohol. Lives with her husband. Social History   Social History  . Marital status: Married    Spouse name: N/A  . Number of children: N/A  . Years of education: N/A   Occupational History  . Not on file.   Social History Main Topics  . Smoking status: Never Smoker  . Smokeless tobacco: Never Used  . Alcohol use No  . Drug use: No  . Sexual activity: Not on file   Other Topics Concern  . Not on file   Social History Narrative  . No narrative on file    FAMILY HISTORY: Family History  Problem Relation Age of Onset  . Cancer Father     bone cancer  . Cancer Sister   . Cancer Brother   .  Breast cancer Neg Hx     ALLERGIES:  is allergic to codeine; eggs or egg-derived products; shellfish allergy; and sodium sulfite.  MEDICATIONS:  Current Outpatient Prescriptions  Medication Sig Dispense Refill  . aspirin EC 81 MG tablet Take 81 mg by mouth.    . Biotin 1000 MCG tablet Take by mouth.    . busPIRone (BUSPAR) 15 MG tablet Take 1 tablet by mouth 4 (four) times daily.    . Calcium Carbonate-Vitamin D3 (CALCIUM 600-D) 600-400 MG-UNIT TABS Take by mouth.    . cetirizine (ZYRTEC) 10 MG tablet Take 10 mg by mouth.    . escitalopram (LEXAPRO) 20 MG tablet Take 20 mg by mouth.    . ferrous sulfate 325 (65 FE) MG tablet Take 325 mg by mouth  daily.    . folic acid (FOLVITE) 1 MG tablet Take 1 mg by mouth daily.    Marland Kitchen gabapentin (NEURONTIN) 400 MG capsule Take 800 mg by mouth 2 (two) times daily.    . hydrochlorothiazide (HYDRODIURIL) 25 MG tablet Take 25 mg by mouth daily.     Marland Kitchen LORazepam (ATIVAN) 0.5 MG tablet Take 1/2 pill (0.25 mg) in the morning, take 1 pill (0.5 mg) at night; may take an extra 1/2 pill (0.25 mg) once a day as needed for anxiety    . lovastatin (MEVACOR) 40 MG tablet Take 1 tablet by mouth daily.    . metFORMIN (GLUCOPHAGE) 1000 MG tablet 1 tab by mouth in AM 1.5 tab by mouth in PM    . metoprolol tartrate (LOPRESSOR) 25 MG tablet Take 1 tablet by mouth 2 (two) times daily.    . pantoprazole (PROTONIX) 40 MG tablet Take 40 mg by mouth daily.    . pioglitazone (ACTOS) 15 MG tablet Take 15 mg by mouth daily.     . RESTASIS 0.05 % ophthalmic emulsion     . vitamin E 400 UNIT capsule Take 400 Units by mouth daily.      No current facility-administered medications for this visit.       Marland Kitchen  PHYSICAL EXAMINATION: ECOG PERFORMANCE STATUS: 0 - Asymptomatic  Vitals:   03/06/16 1429  BP: 103/62  Pulse: 85  Temp: 99 F (37.2 C)   Filed Weights   03/06/16 1429  Weight: 177 lb 0.5 oz (80.3 kg)    GENERAL: Well-nourished well-developed; Alert, no distress and comfortable. With her husband.  EYES: no pallor or icterus OROPHARYNX: no thrush or ulceration; good dentition  NECK: supple, no masses felt LYMPH:  no palpable lymphadenopathy in the cervical, axillary or inguinal regions LUNGS: clear to auscultation and  No wheeze or crackles HEART/CVS: regular rate & rhythm and no murmurs; No lower extremity edema ABDOMEN: abdomen soft, non-tender and normal bowel sounds Musculoskeletal:no cyanosis of digits and no clubbing  PSYCH: alert & oriented x 3 with fluent speech NEURO: no focal motor/sensory deficits SKIN:  no rashes or significant lesions  LABORATORY DATA:  I have reviewed the data as listed Lab  Results  Component Value Date   WBC 10.6 02/22/2016   HGB 14.4 02/22/2016   HCT 43.0 02/22/2016   MCV 86.2 02/22/2016   PLT 187 02/22/2016    Recent Labs  01/18/16 1103 02/01/16 1346 02/22/16 1409  NA 136 136 134*  K 4.0 4.3 4.1  CL 100* 100* 97*  CO2 27 26 27   GLUCOSE 153* 140* 148*  BUN 18 25* 27*  CREATININE 1.00 0.96 0.95  CALCIUM 10.1 10.2 9.6  GFRNONAA 55*  57* 58*  GFRAA >60 >60 >60  PROT  --   --  8.0  ALBUMIN  --   --  4.2  AST  --   --  24  ALT  --   --  23  ALKPHOS  --   --  75  BILITOT  --   --  0.7    RADIOGRAPHIC STUDIES: I have personally reviewed the radiological images as listed and agreed with the findings in the report. No results found.  ASSESSMENT & PLAN:   Carcinoma of overlapping sites of right breast in female, estrogen receptor negative ([place]) # Right breast cancer stage II; ER/ PR her 2 neu- NEG. Clinically no evidence of recurrence.  # Elevated Tumor Marker- CEA 11 in October- Most recent CEA is 8.1(Dec, 2017). At last visit, we discussed possibility of repeat CT Scan but with CEA trending now, will hold on the scan for now. Will re-check labs in 4 months.   # Erythrocytosis- likely artifactual; Stable. Recent hemoglobin 14.4 hematocrit 43. No further workup is necessary at this time.  #Osteopenia- Dexa Scan 2016- T-score -1.4. Continue Prolia. Next injection is in 4 months. Coordinate with labs and MD appointment.   # follow up in 4 months with labs (CBC, CMET, CEA, CA2729) and MD and Prolia injection.      All questions were answered. The patient knows to call the clinic with any problems, questions or concerns.   Faythe Casa, NP   Jacquelin Hawking, NP 03/06/2016 3:54 PM

## 2016-03-06 NOTE — Progress Notes (Signed)
Patient here today for follow up.   

## 2016-04-05 ENCOUNTER — Other Ambulatory Visit: Payer: Self-pay | Admitting: Internal Medicine

## 2016-04-05 DIAGNOSIS — R609 Edema, unspecified: Secondary | ICD-10-CM

## 2016-04-05 DIAGNOSIS — Z87440 Personal history of urinary (tract) infections: Secondary | ICD-10-CM

## 2016-04-05 DIAGNOSIS — R350 Frequency of micturition: Secondary | ICD-10-CM

## 2016-04-09 ENCOUNTER — Ambulatory Visit: Payer: Medicare Other

## 2016-04-09 ENCOUNTER — Ambulatory Visit
Admission: RE | Admit: 2016-04-09 | Discharge: 2016-04-09 | Disposition: A | Discharge status: Home / Self Care | Payer: Medicare Other | Source: Ambulatory Visit | Attending: Internal Medicine | Admitting: Internal Medicine

## 2016-04-09 DIAGNOSIS — Z87440 Personal history of urinary (tract) infections: Secondary | ICD-10-CM

## 2016-04-09 DIAGNOSIS — N281 Cyst of kidney, acquired: Secondary | ICD-10-CM | POA: Insufficient documentation

## 2016-04-09 DIAGNOSIS — R350 Frequency of micturition: Secondary | ICD-10-CM | POA: Insufficient documentation

## 2016-04-12 ENCOUNTER — Ambulatory Visit
Admission: RE | Admit: 2016-04-12 | Discharge: 2016-04-12 | Disposition: A | Discharge status: Home / Self Care | Payer: Medicare Other | Source: Ambulatory Visit | Attending: Internal Medicine | Admitting: Internal Medicine

## 2016-04-12 ENCOUNTER — Other Ambulatory Visit: Payer: Self-pay | Admitting: Internal Medicine

## 2016-04-12 DIAGNOSIS — R609 Edema, unspecified: Secondary | ICD-10-CM | POA: Insufficient documentation

## 2016-04-12 DIAGNOSIS — Z4682 Encounter for fitting and adjustment of non-vascular catheter: Secondary | ICD-10-CM | POA: Insufficient documentation

## 2016-05-01 ENCOUNTER — Ambulatory Visit (INDEPENDENT_AMBULATORY_CARE_PROVIDER_SITE_OTHER): Payer: Medicare Other | Admitting: Vascular Surgery

## 2016-05-01 ENCOUNTER — Encounter (INDEPENDENT_AMBULATORY_CARE_PROVIDER_SITE_OTHER): Payer: Self-pay | Admitting: Vascular Surgery

## 2016-05-01 VITALS — BP 117/65 | HR 79 | Resp 16 | Wt 178.8 lb

## 2016-05-01 DIAGNOSIS — E785 Hyperlipidemia, unspecified: Secondary | ICD-10-CM | POA: Diagnosis not present

## 2016-05-01 DIAGNOSIS — E118 Type 2 diabetes mellitus with unspecified complications: Secondary | ICD-10-CM | POA: Diagnosis not present

## 2016-05-01 DIAGNOSIS — I83813 Varicose veins of bilateral lower extremities with pain: Secondary | ICD-10-CM

## 2016-05-01 DIAGNOSIS — I83893 Varicose veins of bilateral lower extremities with other complications: Secondary | ICD-10-CM | POA: Diagnosis not present

## 2016-05-01 NOTE — Progress Notes (Signed)
Subjective:    Patient ID: Angela David, female    DOB: 07-06-1940, 76 y.o.   MRN: [id number] Chief Complaint  Patient presents with  . Follow-up   Patient last seen in 2015. Presents today with a chief complaint of worsening bilateral lower extremity edema, discomfort and bluish tint to feet. The patient is mostly bed bound due to multiple medical conditions however can walk. She has a diagnosis of peripheral neuropathy however has noticed an increase the swelling and "bluish" tint to her bilateral feet. States her legs are swollen constantly. She lays in bed most of the day. She does not wear compression stockings or elevates her legs. The more swollen her lower extremities the worse the "discomfort". Patient describes a "shooting" pain up her legs. Denies any claudication, rest pain or ulceration to her lower extremity. Denies any history of DVT or PE. Denies any fever, nausea or vomiting.    Review of Systems  Constitutional: Negative.   HENT: Negative.   Eyes: Negative.   Respiratory: Negative.   Cardiovascular: Positive for leg swelling (Lower extremity pain.).  Gastrointestinal: Negative.   Endocrine: Negative.   Genitourinary: Negative.   Musculoskeletal: Negative.   Skin: Negative.   Allergic/Immunologic: Negative.   Neurological: Negative.   Hematological: Negative.   Psychiatric/Behavioral: Negative.       Objective:   Physical Exam  Constitutional: She is oriented to person, place, and time. She appears well-developed and well-nourished.  HENT:  Head: Atraumatic. Macrocephalic.  Right Ear: External ear normal.  Left Ear: External ear normal.  Patient with trach in place.   Eyes: Conjunctivae and EOM are normal. Pupils are equal, round, and reactive to light.  Neck: Normal range of motion.  Cardiovascular: Normal rate, regular rhythm, normal heart sounds and intact distal pulses.   Pulses:      Radial pulses are 2+ on the right side, and 2+ on the left  side.       Dorsalis pedis pulses are 2+ on the right side, and 2+ on the left side.       Posterior tibial pulses are 2+ on the right side, and 2+ on the left side.  Pulmonary/Chest: Effort normal and breath sounds normal.  S/p mastectomy  Musculoskeletal: Normal range of motion. She exhibits edema (Moderate Edema. No cellulitis. Diffuse varicose  veins noted. <1cm bilaterally. ).  Neurological: She is alert and oriented to person, place, and time.  Skin: Skin is warm and dry. No rash noted. No erythema.  Psychiatric: She has a normal mood and affect. Her behavior is normal. Judgment and thought content normal.   BP 117/65   Pulse 79   Resp 16   Wt 178 lb 12.8 oz (81.1 kg)   BMI 29.75 kg/m   Past Medical History:  Diagnosis Date  . Breast cancer ([place]) 2002   right breast chemo and radiation  . Cancer [facility]) 2002   right breast  . Glaucoma   . High cholesterol   . History of colon polyps   . Personal history of chemotherapy 2002   BREAST CA  . Personal history of malignant neoplasm of breast   . Personal history of radiation therapy 2002   BREAST CA  . Sleep apnea 2011   Social History   Social History  . Marital status: Married    Spouse name: N/A  . Number of children: N/A  . Years of education: N/A   Occupational History  . Not on file.   Social  History Main Topics  . Smoking status: Never Smoker  . Smokeless tobacco: Never Used  . Alcohol use No  . Drug use: No  . Sexual activity: Not on file   Other Topics Concern  . Not on file   Social History Narrative  . No narrative on file    Past Surgical History:  Procedure Laterality Date  . ABDOMINAL HYSTERECTOMY    . BREAST BIOPSY Right 2008   neg  . BREAST EXCISIONAL BIOPSY Right 2002   positive  . BREAST SURGERY Right 2002   LUMPECTOMY, radiation, chemotherapy  . COLONOSCOPY  2009,12/30/2013  . HYDROCELE EXCISION / REPAIR    . TRACHEAL SURGERY  2013  . TRACHEOSTOMY N/A     Family History    Problem Relation Age of Onset  . Cancer Father     bone cancer  . Cancer Sister   . Cancer Brother   . Breast cancer Neg Hx     Allergies  Allergen Reactions  . Codeine     Per patient "it kept her up all night"  . Eggs Or Egg-Derived Products     respitatory distress   . Shellfish Allergy     respitatory distress    . Sodium Sulfite Other (See Comments)       Assessment & Plan:  Patient last seen in 2015. Presents today with a chief complaint of worsening bilateral lower extremity edema, discomfort and bluish tint to feet. The patient is mostly bed bound due to multiple medical conditions however can walk. She has a diagnosis of peripheral neuropathy however has noticed an increase the swelling and "bluish" tint to her bilateral feet. States her legs are swollen constantly. She lays in bed most of the day. She does not wear compression stockings or elevates her legs. The more swollen her lower extremities the worse the "discomfort". Patient describes a "shooting" pain up her legs. Denies any claudication, rest pain or ulceration to her lower extremity. Denies any history of DVT or PE. Denies any fever, nausea or vomiting.   1. Varicose veins of both legs with edema - New Will bring patient back for bilateral venous duplex rule out lymphedema vs venous reflux. The patient was encouraged to wear graduated compression stockings (20-30 mmHg) on a daily basis. The patient was instructed to begin wearing the stockings first thing in the morning and removing them in the evening. The patient was instructed specifically not to sleep in the stockings. Prescription given.  In addition, behavioral modification including elevation during the day will be initiated. Anti-inflammatories for pain.  - VAS [place] LOWER EXTREMITY VENOUS REFLUX; Future  2. Varicose veins of bilateral lower extremities with pain - New Will bring patient back for bilateral venous duplex rule out lymphedema vs venous  reflux. The patient was encouraged to wear graduated compression stockings (20-30 mmHg) on a daily basis. The patient was instructed to begin wearing the stockings first thing in the morning and removing them in the evening. The patient was instructed specifically not to sleep in the stockings. Prescription given.  In addition, behavioral modification including elevation during the day will be initiated. Anti-inflammatories for pain.  - VAS [place] LOWER EXTREMITY VENOUS REFLUX; Future  3. Hyperlipidemia, unspecified hyperlipidemia type - Stable Encouraged good control as its slows the progression of atherosclerotic disease  4. Type 2 diabetes mellitus with complication, unspecified long term insulin use status (HCC) - Stable Encouraged good control as its slows the progression of atherosclerotic disease  Current Outpatient  Prescriptions on File Prior to Visit  Medication Sig Dispense Refill  . aspirin EC 81 MG tablet Take 81 mg by mouth.    . Biotin 1000 MCG tablet Take by mouth.    . busPIRone (BUSPAR) 15 MG tablet Take 1 tablet by mouth 4 (four) times daily.    . Calcium Carbonate-Vitamin D3 (CALCIUM 600-D) 600-400 MG-UNIT TABS Take by mouth.    . cetirizine (ZYRTEC) 10 MG tablet Take 10 mg by mouth.    . escitalopram (LEXAPRO) 20 MG tablet Take 20 mg by mouth.    . ferrous sulfate 325 (65 FE) MG tablet Take 325 mg by mouth daily.    . folic acid (FOLVITE) 1 MG tablet Take 1 mg by mouth daily.    Marland Kitchen gabapentin (NEURONTIN) 400 MG capsule Take 800 mg by mouth 2 (two) times daily.    . hydrochlorothiazide (HYDRODIURIL) 25 MG tablet Take 25 mg by mouth daily.     Marland Kitchen LORazepam (ATIVAN) 0.5 MG tablet Take 1/2 pill (0.25 mg) in the morning, take 1 pill (0.5 mg) at night; may take an extra 1/2 pill (0.25 mg) once a day as needed for anxiety    . lovastatin (MEVACOR) 40 MG tablet Take 1 tablet by mouth daily.    . metFORMIN (GLUCOPHAGE) 1000 MG tablet 1 tab by mouth in AM 1.5 tab by mouth in PM    .  metoprolol tartrate (LOPRESSOR) 25 MG tablet Take 1 tablet by mouth 2 (two) times daily.    . pantoprazole (PROTONIX) 40 MG tablet Take 40 mg by mouth daily.    . pioglitazone (ACTOS) 15 MG tablet Take 15 mg by mouth daily.     . RESTASIS 0.05 % ophthalmic emulsion     . vitamin E 400 UNIT capsule Take 400 Units by mouth daily.      No current facility-administered medications on file prior to visit.     There are no Patient Instructions on file for this visit. No Follow-up on file.   Fryda Molenda A Kimbria Camposano, PA-C

## 2016-06-20 ENCOUNTER — Encounter (INDEPENDENT_AMBULATORY_CARE_PROVIDER_SITE_OTHER): Payer: Medicare Other

## 2016-06-20 ENCOUNTER — Ambulatory Visit (INDEPENDENT_AMBULATORY_CARE_PROVIDER_SITE_OTHER): Payer: Medicare Other | Admitting: Vascular Surgery

## 2016-06-28 ENCOUNTER — Inpatient Hospital Stay: Payer: Medicare Other

## 2016-06-29 ENCOUNTER — Inpatient Hospital Stay: Payer: Medicare Other | Attending: Internal Medicine

## 2016-06-29 DIAGNOSIS — G629 Polyneuropathy, unspecified: Secondary | ICD-10-CM | POA: Insufficient documentation

## 2016-06-29 DIAGNOSIS — Z809 Family history of malignant neoplasm, unspecified: Secondary | ICD-10-CM | POA: Diagnosis not present

## 2016-06-29 DIAGNOSIS — Z7984 Long term (current) use of oral hypoglycemic drugs: Secondary | ICD-10-CM | POA: Insufficient documentation

## 2016-06-29 DIAGNOSIS — D751 Secondary polycythemia: Secondary | ICD-10-CM | POA: Diagnosis not present

## 2016-06-29 DIAGNOSIS — Z808 Family history of malignant neoplasm of other organs or systems: Secondary | ICD-10-CM | POA: Diagnosis not present

## 2016-06-29 DIAGNOSIS — E7800 Pure hypercholesterolemia, unspecified: Secondary | ICD-10-CM | POA: Insufficient documentation

## 2016-06-29 DIAGNOSIS — G4730 Sleep apnea, unspecified: Secondary | ICD-10-CM | POA: Diagnosis not present

## 2016-06-29 DIAGNOSIS — Z86010 Personal history of colonic polyps: Secondary | ICD-10-CM | POA: Diagnosis not present

## 2016-06-29 DIAGNOSIS — Z853 Personal history of malignant neoplasm of breast: Secondary | ICD-10-CM | POA: Diagnosis not present

## 2016-06-29 DIAGNOSIS — D0590 Unspecified type of carcinoma in situ of unspecified breast: Secondary | ICD-10-CM

## 2016-06-29 DIAGNOSIS — M8580 Other specified disorders of bone density and structure, unspecified site: Secondary | ICD-10-CM | POA: Insufficient documentation

## 2016-06-29 DIAGNOSIS — Z171 Estrogen receptor negative status [ER-]: Secondary | ICD-10-CM

## 2016-06-29 DIAGNOSIS — R5382 Chronic fatigue, unspecified: Secondary | ICD-10-CM | POA: Diagnosis not present

## 2016-06-29 DIAGNOSIS — Z79899 Other long term (current) drug therapy: Secondary | ICD-10-CM | POA: Diagnosis not present

## 2016-06-29 DIAGNOSIS — J3800 Paralysis of vocal cords and larynx, unspecified: Secondary | ICD-10-CM | POA: Diagnosis not present

## 2016-06-29 DIAGNOSIS — C50811 Malignant neoplasm of overlapping sites of right female breast: Secondary | ICD-10-CM

## 2016-06-29 LAB — CBC WITH DIFFERENTIAL/PLATELET
Basophils Absolute: 0.1 K/uL (ref 0–0.1)
Basophils Relative: 1 %
Eosinophils Absolute: 0.2 K/uL (ref 0–0.7)
Eosinophils Relative: 3 %
HCT: 40.1 % (ref 35.0–47.0)
Hemoglobin: 13.7 g/dL (ref 12.0–16.0)
Lymphocytes Relative: 15 %
Lymphs Abs: 1.2 K/uL (ref 1.0–3.6)
MCH: 28.7 pg (ref 26.0–34.0)
MCHC: 34.1 g/dL (ref 32.0–36.0)
MCV: 84.3 fL (ref 80.0–100.0)
Monocytes Absolute: 0.5 K/uL (ref 0.2–0.9)
Monocytes Relative: 6 %
Neutro Abs: 6.2 K/uL (ref 1.4–6.5)
Neutrophils Relative %: 75 %
Platelets: 206 K/uL (ref 150–440)
RBC: 4.76 MIL/uL (ref 3.80–5.20)
RDW: 12.2 % (ref 11.5–14.5)
WBC: 8.1 K/uL (ref 3.6–11.0)

## 2016-06-29 LAB — COMPREHENSIVE METABOLIC PANEL
ALT: 21 U/L (ref 14–54)
AST: 25 U/L (ref 15–41)
Albumin: 4 g/dL (ref 3.5–5.0)
Alkaline Phosphatase: 68 U/L (ref 38–126)
Anion gap: 8 (ref 5–15)
BILIRUBIN TOTAL: 0.9 mg/dL (ref 0.3–1.2)
BUN: 12 mg/dL (ref 6–20)
CHLORIDE: 92 mmol/L — AB (ref 101–111)
CO2: 27 mmol/L (ref 22–32)
CREATININE: 0.8 mg/dL (ref 0.44–1.00)
Calcium: 9.6 mg/dL (ref 8.9–10.3)
Glucose, Bld: 112 mg/dL — ABNORMAL HIGH (ref 65–99)
Potassium: 3.8 mmol/L (ref 3.5–5.1)
Sodium: 127 mmol/L — ABNORMAL LOW (ref 135–145)
TOTAL PROTEIN: 7.5 g/dL (ref 6.5–8.1)

## 2016-06-30 LAB — CANCER ANTIGEN 27.29: CA 27.29: 31.4 U/mL (ref 0.0–38.6)

## 2016-06-30 LAB — CEA: CEA: 7.2 ng/mL — ABNORMAL HIGH (ref 0.0–4.7)

## 2016-07-03 ENCOUNTER — Inpatient Hospital Stay: Payer: Medicare Other | Attending: Internal Medicine | Admitting: Internal Medicine

## 2016-07-03 NOTE — Progress Notes (Deleted)
-[place] CONSULT NOTE  Patient Care Team: Albina Billet, MD as PCP - General (Unknown Physician Specialty) Christene Lye, MD (General Surgery) Albina Billet, MD (Internal Medicine)  CHIEF COMPLAINTS/PURPOSE OF CONSULTATION:  Breast cancer/ erythrocytosis  #  Oncology History   # 2002- Right Breast ? Stage II lumpec [Dr.sankar] & chemo [AC x4; ? Taxol; Dr.Hathorne; [facility]] s/p RT [[place]]  # Elevated CEA [facility] Nov 2015- N; Dr.Sanker]  # ? 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  female with above history of right breast cancer- 2002; most recently was evaluated by Dr. Jamal Collin. It was noted patient had elevated hemoglobin/hematocrit. She has been referred to [place] for further work up. Patient dictates that she wants to move her care for her breast cancer locally.  Patient has chronic bilateral shoulder arthritis not any worse. She denies any weight loss. Denies any unusual bone pain. She is chronic difficulty walking/neuropathy from her diabetes; previous chemotherapy. Denies any blood in stools or black stools. Denies any abdominal pain. She is chronic intermittent constipation. Not any worse. Last colonoscopy was in November 2015 small polyps taken out.  ROS: A complete 10 point review of system is done which is negative except mentioned above in history of present illness  MEDICAL HISTORY:  Past Medical History:  Diagnosis Date  . Breast cancer ([place]) 2002   right breast chemo and radiation  . Cancer [facility]) 2002   right breast  . Glaucoma   . High cholesterol   . History of colon polyps   . Personal history of chemotherapy 2002   BREAST CA  . Personal history of  malignant neoplasm of breast   . Personal history of radiation therapy 2002   BREAST CA  . Sleep apnea 2011    SURGICAL HISTORY: Past Surgical History:  Procedure Laterality Date  . ABDOMINAL HYSTERECTOMY    . BREAST BIOPSY Right 2008   neg  . BREAST EXCISIONAL BIOPSY Right 2002   positive  . BREAST SURGERY Right 2002   LUMPECTOMY, radiation, chemotherapy  . COLONOSCOPY  2009,12/30/2013  . HYDROCELE EXCISION / REPAIR    . TRACHEAL SURGERY  2013  . TRACHEOSTOMY N/A     SOCIAL HISTORY: No smoking. No alcohol. Lives with her husband. Social History   Social History  . Marital status: Married    Spouse name: N/A  . Number of children: N/A  . Years of education: N/A   Occupational History  . Not on file.   Social History Main Topics  . Smoking status: Never Smoker  . Smokeless tobacco: Never Used  . Alcohol use No  . Drug use: No  . Sexual activity: Not on file   Other Topics Concern  . Not on file   Social History Narrative  . No narrative on file    FAMILY HISTORY: Family History  Problem Relation Age of Onset  . Cancer Father     bone cancer  . Cancer Sister   . Cancer Brother   . Breast cancer Neg Hx     ALLERGIES:  is allergic to codeine; eggs or egg-derived products; shellfish allergy; and sodium sulfite.  MEDICATIONS:  Current Outpatient Prescriptions  Medication Sig Dispense Refill  . aspirin EC 81 MG tablet Take 81 mg by mouth.    . Biotin 1000 MCG tablet Take by mouth.    . busPIRone (BUSPAR) 15 MG tablet Take 1 tablet by mouth 4 (four) times daily.    . Calcium Carbonate-Vitamin D3 (CALCIUM 600-D) 600-400 MG-UNIT TABS Take by mouth.    . cetirizine (ZYRTEC) 10 MG tablet Take 10 mg by mouth.    . escitalopram (LEXAPRO) 20 MG tablet Take 20 mg by mouth.    . ferrous sulfate 325 (65 FE) MG tablet Take 325 mg by mouth daily.    . folic acid (FOLVITE) 1 MG tablet Take 1 mg by mouth daily.    Marland Kitchen gabapentin (NEURONTIN) 400 MG capsule Take 800 mg by  mouth 2 (two) times daily.    . hydrochlorothiazide (HYDRODIURIL) 25 MG tablet Take 25 mg by mouth daily.     Marland Kitchen LORazepam (ATIVAN) 0.5 MG tablet Take 1/2 pill (0.25 mg) in the morning, take 1 pill (0.5 mg) at night; may take an extra 1/2 pill (0.25 mg) once a day as needed for anxiety    . lovastatin (MEVACOR) 40 MG tablet Take 1 tablet by mouth daily.    . metFORMIN (GLUCOPHAGE) 1000 MG tablet 1 tab by mouth in AM 1.5 tab by mouth in PM    . metoprolol tartrate (LOPRESSOR) 25 MG tablet Take 1 tablet by mouth 2 (two) times daily.    . pantoprazole (PROTONIX) 40 MG tablet Take 40 mg by mouth daily.    . pioglitazone (ACTOS) 15 MG tablet Take 15 mg by mouth daily.     . RESTASIS 0.05 % ophthalmic emulsion     . vitamin E 400 UNIT capsule Take 400 Units by mouth daily.      No current facility-administered medications for this visit.       Marland Kitchen  PHYSICAL EXAMINATION: ECOG PERFORMANCE STATUS: 0 - Asymptomatic  There were no vitals filed for this visit. There were no vitals filed for this visit.  GENERAL: Well-nourished well-developed; Alert, no distress and comfortable. With her husband.  EYES: no pallor or icterus OROPHARYNX: no thrush or ulceration; good dentition  NECK: supple, no masses felt LYMPH:  no palpable lymphadenopathy in the cervical, axillary or inguinal regions LUNGS: clear to auscultation and  No wheeze or crackles HEART/CVS: regular rate & rhythm and no murmurs; No lower extremity edema ABDOMEN: abdomen soft, non-tender and normal bowel sounds Musculoskeletal:no cyanosis of digits and no clubbing  PSYCH: alert & oriented x 3 with fluent speech NEURO: no focal motor/sensory deficits SKIN:  no rashes or significant lesions  LABORATORY DATA:  I have reviewed the data as listed Lab Results  Component Value Date   WBC 8.1 06/29/2016   HGB 13.7 06/29/2016   HCT 40.1 06/29/2016   MCV 84.3 06/29/2016   PLT 206 06/29/2016    Recent Labs  02/01/16 1346 02/22/16 1409  06/29/16 1342  NA 136 134* 127*  K 4.3 4.1 3.8  CL 100* 97* 92*  CO2 26 27 27   GLUCOSE 140* 148* 112*  BUN 25* 27* 12  CREATININE 0.96 0.95 0.80  CALCIUM 10.2 9.6 9.6  GFRNONAA 57* 58* >60  GFRAA >60 >60 >60  PROT  --  8.0 7.5  ALBUMIN  --  4.2 4.0  AST  --  24 25  ALT  --  23 21  ALKPHOS  --  75 68  BILITOT  --  0.7 0.9    RADIOGRAPHIC STUDIES: I have personally reviewed the radiological images as listed and agreed with the findings in the report. No results found.  ASSESSMENT & PLAN:   No problem-specific Assessment & Plan notes found for this encounter.  All questions were answered. The patient knows to call the clinic with any problems, questions or concerns.    Cammie Sickle, MD 07/03/2016 8:25 AM

## 2016-07-03 NOTE — Assessment & Plan Note (Deleted)
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 Clinically no evidence of recurrence.  # Elevated Tumor Marker- CEA 11 in October- Most recent CEA is 8.1(Dec, 2017). At last visit, we discussed possibility of repeat CT Scan but with CEA trending now, will hold on the scan for now. Will re-check labs in 4 months.   # Erythrocytosis- likely artifactual; Stable. Recent hemoglobin 14.4 hematocrit 43. No further workup is necessary at this time.  #Osteopenia- Dexa Scan 2016- T-score -1.4. Continue Prolia. Next injection is in 4 months. Coordinate with labs and MD appointment.   # follow up in 4 months with labs (CBC, CMET, CEA, CA2729) and MD and Prolia injection.

## 2016-07-10 ENCOUNTER — Inpatient Hospital Stay (HOSPITAL_BASED_OUTPATIENT_CLINIC_OR_DEPARTMENT_OTHER): Payer: Medicare Other | Admitting: Internal Medicine

## 2016-07-10 ENCOUNTER — Inpatient Hospital Stay: Payer: Medicare Other

## 2016-07-10 VITALS — BP 118/73 | HR 75 | Temp 97.5°F | Resp 20 | Ht 65.0 in | Wt 179.0 lb

## 2016-07-10 DIAGNOSIS — Z7984 Long term (current) use of oral hypoglycemic drugs: Secondary | ICD-10-CM | POA: Diagnosis not present

## 2016-07-10 DIAGNOSIS — Z853 Personal history of malignant neoplasm of breast: Secondary | ICD-10-CM

## 2016-07-10 DIAGNOSIS — J3800 Paralysis of vocal cords and larynx, unspecified: Secondary | ICD-10-CM | POA: Diagnosis not present

## 2016-07-10 DIAGNOSIS — Z171 Estrogen receptor negative status [ER-]: Secondary | ICD-10-CM

## 2016-07-10 DIAGNOSIS — G629 Polyneuropathy, unspecified: Secondary | ICD-10-CM | POA: Diagnosis not present

## 2016-07-10 DIAGNOSIS — Z809 Family history of malignant neoplasm, unspecified: Secondary | ICD-10-CM

## 2016-07-10 DIAGNOSIS — Z808 Family history of malignant neoplasm of other organs or systems: Secondary | ICD-10-CM

## 2016-07-10 DIAGNOSIS — E7800 Pure hypercholesterolemia, unspecified: Secondary | ICD-10-CM

## 2016-07-10 DIAGNOSIS — R5382 Chronic fatigue, unspecified: Secondary | ICD-10-CM

## 2016-07-10 DIAGNOSIS — Z86010 Personal history of colonic polyps: Secondary | ICD-10-CM | POA: Diagnosis not present

## 2016-07-10 DIAGNOSIS — G4730 Sleep apnea, unspecified: Secondary | ICD-10-CM

## 2016-07-10 DIAGNOSIS — M8580 Other specified disorders of bone density and structure, unspecified site: Secondary | ICD-10-CM

## 2016-07-10 DIAGNOSIS — Z79899 Other long term (current) drug therapy: Secondary | ICD-10-CM | POA: Diagnosis not present

## 2016-07-10 DIAGNOSIS — D751 Secondary polycythemia: Secondary | ICD-10-CM | POA: Diagnosis not present

## 2016-07-10 DIAGNOSIS — C50811 Malignant neoplasm of overlapping sites of right female breast: Secondary | ICD-10-CM

## 2016-07-10 DIAGNOSIS — M810 Age-related osteoporosis without current pathological fracture: Secondary | ICD-10-CM

## 2016-07-10 MED ORDER — DENOSUMAB 60 MG/ML ~~LOC~~ SOLN
60.0000 mg | Freq: Once | SUBCUTANEOUS | Status: AC
  Administered 2016-07-10: 60 mg via SUBCUTANEOUS

## 2016-07-10 NOTE — Progress Notes (Signed)
-[facility] CONSULT NOTE  Patient Care Team: Albina Billet, MD as PCP - General (Unknown Physician Specialty) Christene Lye, MD (General Surgery) Albina Billet, MD (Internal Medicine)  CHIEF COMPLAINTS/PURPOSE OF CONSULTATION:  Breast cancer/ erythrocytosis  #  Oncology History   # 2002- Right Breast ? Stage II lumpec [Dr.sankar] & chemo [AC x4; ? Taxol; Dr.Hathorne; [facility]] s/p RT [[place]]  # Elevated CEA [facility] Nov 2015- N; Dr.Sanker]  # ? 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  female with above history of right breast cancer- 2002- Currently on surveillance is here for follow-up. Patient is on Prolia for osteopenia.  Patient has been getting acupuncture for her neuropathy. Improving. She also complains of chronic fatigue-not any worse. She has been following up with chiropractor.   Denies any blood in stools or black stools. Denies any abdominal pain. No nausea no vomiting.  ROS: A complete 10 point review of system is done which is negative except mentioned above in history of present illness  MEDICAL HISTORY:  Past Medical History:  Diagnosis Date  . Breast cancer ([place]) 2002   right breast chemo and radiation  . Cancer [facility]) 2002   right breast  . Glaucoma   . High cholesterol   . History of colon polyps   . Personal history of chemotherapy 2002   BREAST CA  . Personal history of malignant neoplasm of breast   . Personal history of radiation therapy 2002   BREAST CA  . Sleep apnea 2011    SURGICAL HISTORY: Past Surgical History:  Procedure Laterality Date  . ABDOMINAL HYSTERECTOMY    . BREAST BIOPSY Right 2008   neg  . BREAST EXCISIONAL BIOPSY  Right 2002   positive  . BREAST SURGERY Right 2002   LUMPECTOMY, radiation, chemotherapy  . COLONOSCOPY  2009,12/30/2013  . HYDROCELE EXCISION / REPAIR    . TRACHEAL SURGERY  2013  . TRACHEOSTOMY N/A     SOCIAL HISTORY: No smoking. No alcohol. Lives with her husband. Social History   Social History  . Marital status: Married    Spouse name: N/A  . Number of children: N/A  . Years of education: N/A   Occupational History  . Not on file.   Social History Main Topics  . Smoking status: Never Smoker  . Smokeless tobacco: Never Used  . Alcohol use No  . Drug use: No  . Sexual activity: Not on file   Other Topics Concern  . Not on file   Social History Narrative  . No narrative on file    FAMILY HISTORY: Family History  Problem Relation Age of Onset  . Cancer Father        bone cancer  . Cancer Sister   . Cancer Brother   . Breast cancer Neg Hx     ALLERGIES:  is allergic to codeine; eggs or egg-derived products; shellfish allergy; and sodium sulfite.  MEDICATIONS:  Current Outpatient Prescriptions  Medication Sig Dispense Refill  . Biotin 1000 MCG tablet Take by mouth.    . busPIRone (BUSPAR) 15 MG tablet Take 1 tablet by mouth 4 (four) times daily.    Marland Kitchen  Calcium Carbonate-Vitamin D3 (CALCIUM 600-D) 600-400 MG-UNIT TABS Take 1 tablet by mouth daily.     . cetirizine (ZYRTEC) 10 MG tablet Take 10 mg by mouth.    . escitalopram (LEXAPRO) 20 MG tablet Take 20 mg by mouth.    . ferrous sulfate 325 (65 FE) MG tablet Take 325 mg by mouth daily.    . folic acid (FOLVITE) 1 MG tablet Take 1 mg by mouth daily.    Marland Kitchen gabapentin (NEURONTIN) 400 MG capsule Take 800 mg by mouth 2 (two) times daily.    . hydrochlorothiazide (HYDRODIURIL) 25 MG tablet Take 25 mg by mouth daily.     . metFORMIN (GLUCOPHAGE) 1000 MG tablet 1 tab by mouth in AM 1.5 tab by mouth in PM    . metoprolol tartrate (LOPRESSOR) 25 MG tablet Take 1 tablet by mouth 2 (two) times daily.    . pantoprazole  (PROTONIX) 40 MG tablet Take 40 mg by mouth daily.    . pioglitazone (ACTOS) 15 MG tablet Take 15 mg by mouth daily.     . RESTASIS 0.05 % ophthalmic emulsion     . furosemide (LASIX) 20 MG tablet Take 1 tablet by mouth daily.    Marland Kitchen LORazepam (ATIVAN) 0.5 MG tablet Take 1/2 pill (0.25 mg) in the morning, take 1 pill (0.5 mg) at night; may take an extra 1/2 pill (0.25 mg) once a day as needed for anxiety    . lovastatin (MEVACOR) 40 MG tablet Take 1 tablet by mouth daily.    . vitamin E 400 UNIT capsule Take 400 Units by mouth daily.      No current facility-administered medications for this visit.    Facility-Administered Medications Ordered in Other Visits  Medication Dose Route Frequency Provider Last Rate Last Dose  . denosumab (PROLIA) injection 60 mg  60 mg Subcutaneous Once Charlaine Dalton R, MD          .  PHYSICAL EXAMINATION: ECOG PERFORMANCE STATUS: 0 - Asymptomatic  Vitals:   07/10/16 1401  BP: 118/73  Pulse: 75  Resp: 20  Temp: 97.5 F (36.4 C)   Filed Weights   07/10/16 1401  Weight: 179 lb (81.2 kg)    GENERAL: Well-nourished well-developed; Alert, no distress and comfortable. With her husband.  EYES: no pallor or icterus OROPHARYNX: no thrush or ulceration; good dentition;  NECK: supple, no masses felt; Tracheostomy noted  LYMPH:  no palpable lymphadenopathy in the cervical, axillary or inguinal regions LUNGS: clear to auscultation and  No wheeze or crackles HEART/CVS: regular rate & rhythm and no murmurs; No lower extremity edema ABDOMEN: abdomen soft, non-tender and normal bowel sounds Musculoskeletal:no cyanosis of digits and no clubbing  PSYCH: alert & oriented x 3 with fluent speech NEURO: no focal motor/sensory deficits SKIN:  no rashes or significant lesions  LABORATORY DATA:  I have reviewed the data as listed Lab Results  Component Value Date   WBC 8.1 06/29/2016   HGB 13.7 06/29/2016   HCT 40.1 06/29/2016   MCV 84.3 06/29/2016   PLT  206 06/29/2016    Recent Labs  02/01/16 1346 02/22/16 1409 06/29/16 1342  NA 136 134* 127*  K 4.3 4.1 3.8  CL 100* 97* 92*  CO2 26 27 27   GLUCOSE 140* 148* 112*  BUN 25* 27* 12  CREATININE 0.96 0.95 0.80  CALCIUM 10.2 9.6 9.6  GFRNONAA 57* 58* >60  GFRAA >60 >60 >60  PROT  --  8.0 7.5  ALBUMIN  --  4.2 4.0  AST  --  24 25  ALT  --  23 21  ALKPHOS  --  75 68  BILITOT  --  0.7 0.9    RADIOGRAPHIC STUDIES: I have personally reviewed the radiological images as listed and agreed with the findings in the report. No results found.  ASSESSMENT & PLAN:   Carcinoma of overlapping sites of right breast in female, estrogen receptor negative ([place]) # Right breast cancer stage II; ER/ PR her 2 neu- NEG. Clinically no evidence of recurrence. Last mammogram October 2017 within normal limits.  # Elevated Tumor Marker- CEA 11 in October 17- Most recent CEA is 8.1(Dec, 2017). Today's CEA [facility] 2018]-7 CA-[id number] 31/trending down from October 2017. Again discussed this is likely artifactual and not related to malignancy.  # Osteopenia- Dexa Scan 2016- T-score -1.4. Continue Prolia; due today. Calcium within normal limits. Patient calcium and vitamin D. No ON J suspected.    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   # follow up in 6 months with labs (labs a few days prior CBC, CMET, CEA, [id number]) and MD and Prolia injection.    All questions were answered. The patient knows to call the clinic with any problems, questions or concerns.    Cammie Sickle, MD 07/10/2016 2:38 PM

## 2016-07-10 NOTE — Progress Notes (Signed)
Pt taking multiple herbal supplement in a capsule and a compounded liquid form (patient doesn't know the ingredients or the name of the herbal supplement). Per pt, these supplements are "soy-based free." States that "I need to have this herb to clear the toxics and everything out."  Patient went to see Dr. Birdie Sons Chiropractor for her acupuncture. She states that the chiropractor asked her to take these supplements. She states that her performance status as improved in mobility. Her chiropractor prescribed dietary habits - changes.  She is on a high protein diet of chicken and fish and eat green vegetables and collards. She does not need any sweets. Her blood sugars have been well maintained.

## 2016-07-10 NOTE — Assessment & Plan Note (Addendum)
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 Clinically no evidence of recurrence. Last mammogram October 2017 within normal limits.  # Elevated Tumor Marker- CEA 11 in October 17- Most recent CEA is 8.1(Dec, 2017). Today's CEA [facility] 2018]-7 CA-[id number] 31/trending down from October 2017. Again discussed this is likely artifactual and not related to malignancy.  # Osteopenia- Dexa Scan 2016- T-score -1.4. Continue Prolia; due today. Calcium within normal limits. Patient calcium and vitamin D. No ON J suspected.    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   # follow up in 6 months with labs (labs a few days prior CBC, CMET, CEA, [id number]) and MD and Prolia injection.

## 2016-07-11 ENCOUNTER — Encounter: Payer: Self-pay | Admitting: *Deleted

## 2016-07-11 NOTE — Progress Notes (Signed)
Pt's husband brought herbal supplements to cancer center for RN to review. Non formulary medications added to pt's list. MD made aware.

## 2016-08-03 IMAGING — CT CT HEAD W/O CM
3 of 5 series · 16 of 47 positions shown, 19 images · non-contrast
Comparison: CT head 10/25/2010.

CLINICAL DATA: Recent fall. Facial pain. Headache. RIGHT-sided
tenderness. Breast cancer.

EXAM:
CT HEAD WITHOUT CONTRAST
CT MAXILLOFACIAL WITHOUT CONTRAST
TECHNIQUE: Multidetector CT imaging of the head and maxillofacial structures
were performed using the standard protocol without intravenous
contrast. Multiplanar CT image reconstructions of the maxillofacial
structures were also generated.

[Series 4: max soft · axial · 0.34mm/px · z∈[+14,+156]mm · 12 of 79 slices shown, 15 images]
[im 4/79  brain]
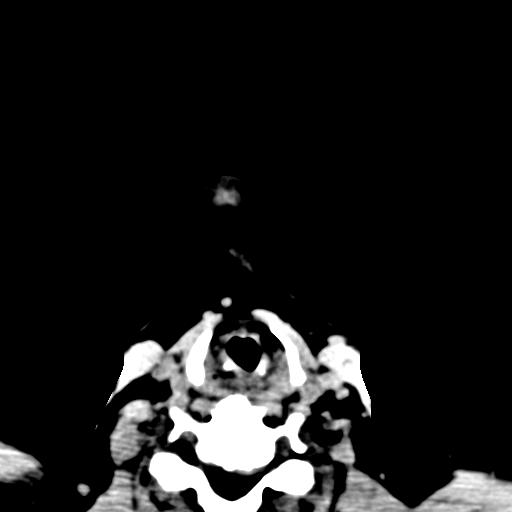
[im 4/79  bone]
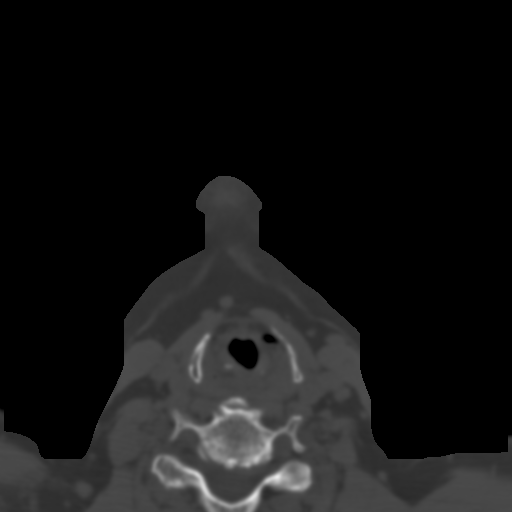
[im 12/79  brain]
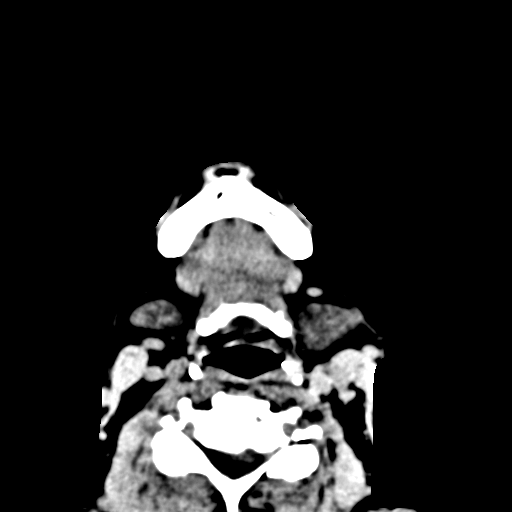
[im 19/79  brain]
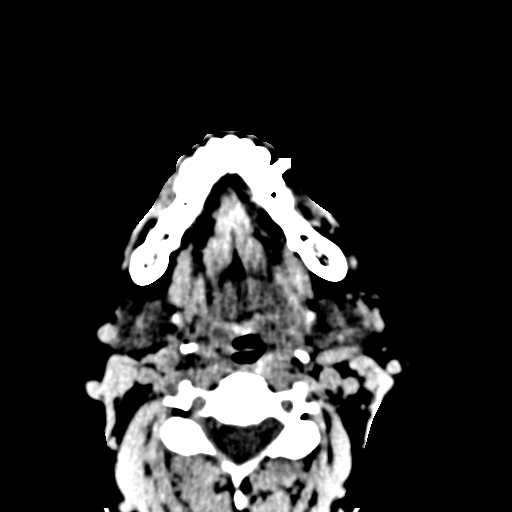
[im 23/79  brain]
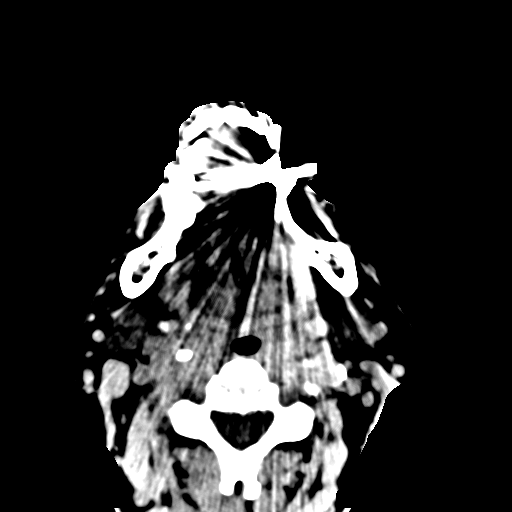
[im 30/79  brain]
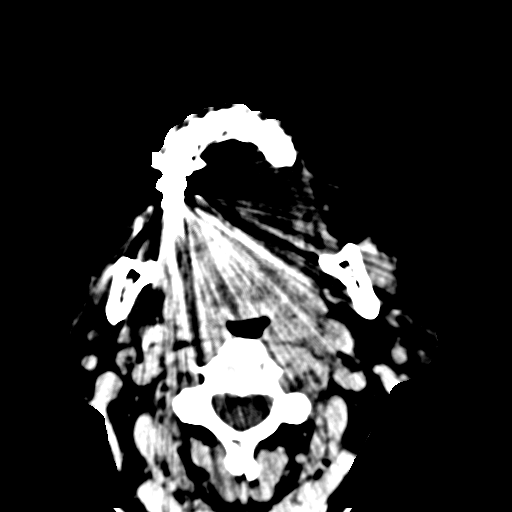
[im 30/79  bone]
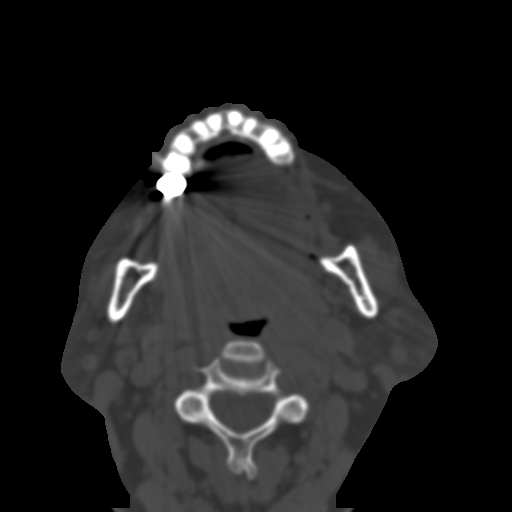
[im 38/79  brain]
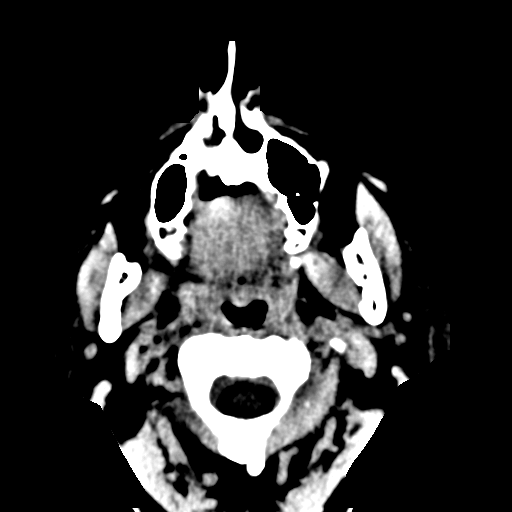
[im 41/79  brain]
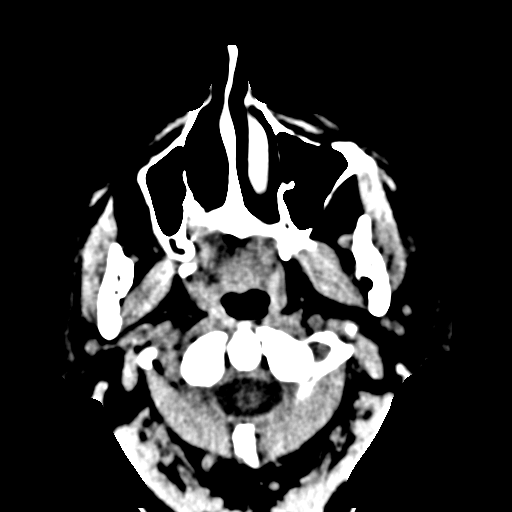
[im 49/79  brain]
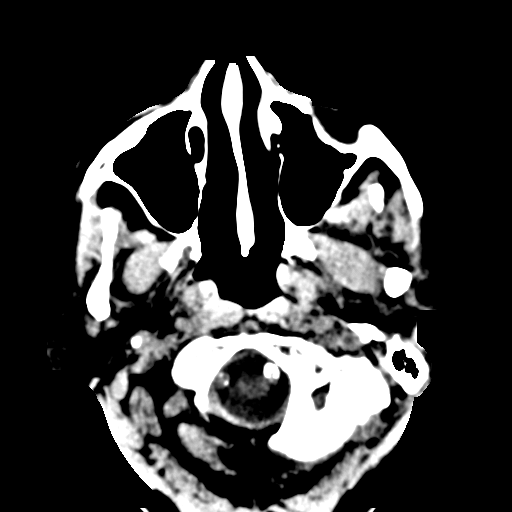
[im 56/79  brain]
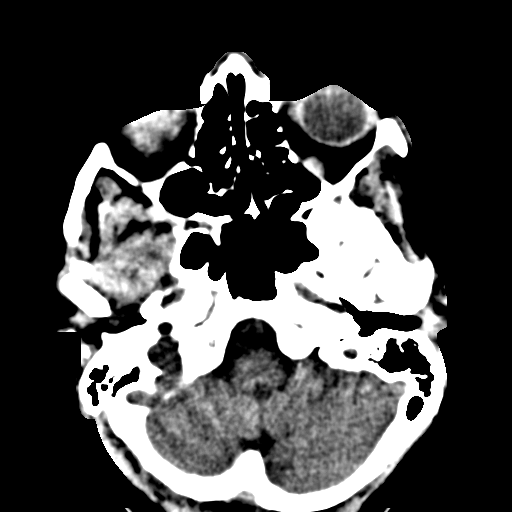
[im 56/79  bone]
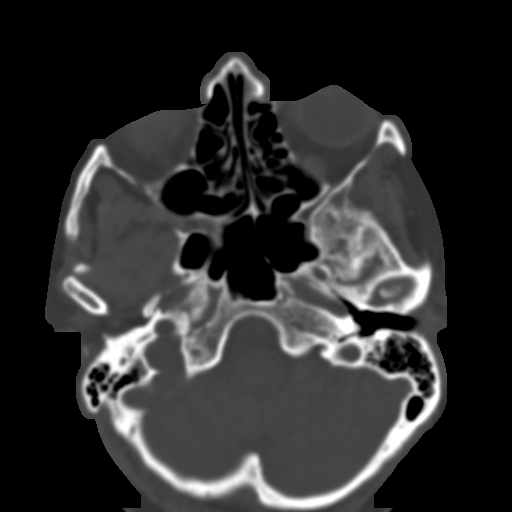
[im 60/79  brain]
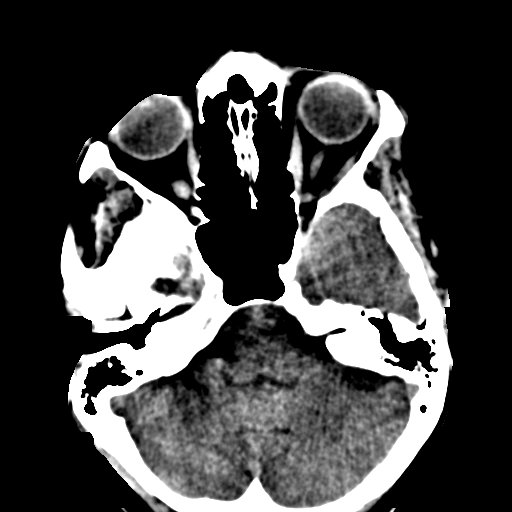
[im 67/79  brain]
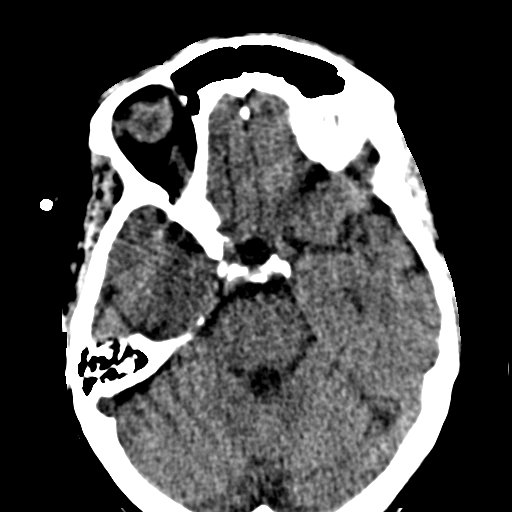
[im 75/79  brain]
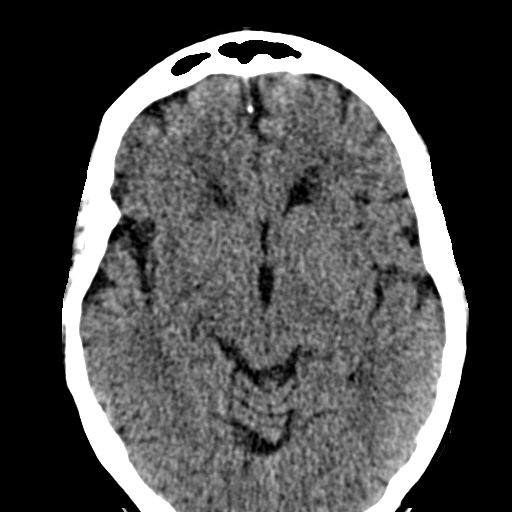

[Series 6: coronal soft · coronal · 0.33mm/px · 3 of 84 slices shown]
[im 28/84  brain]
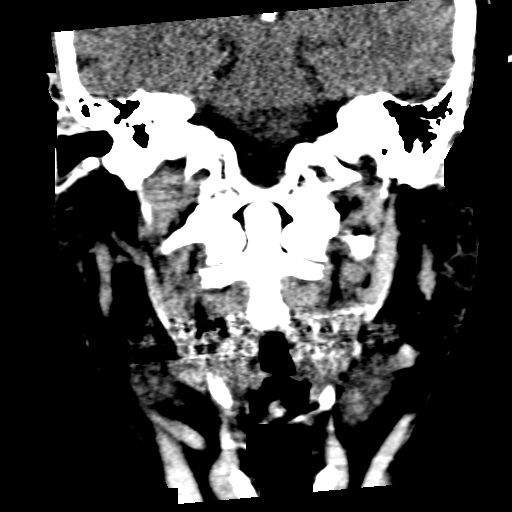
[im 37/84  brain]
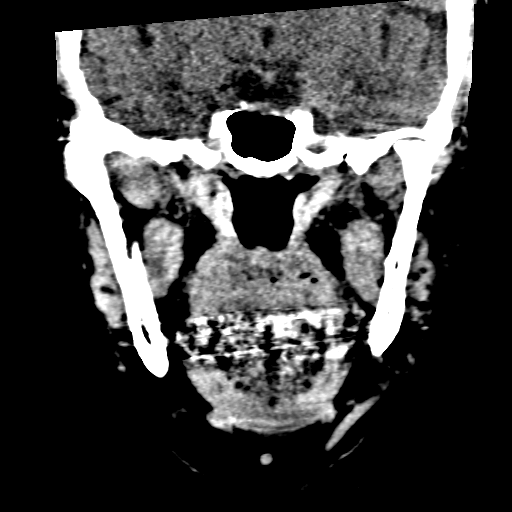
[im 47/84  brain]
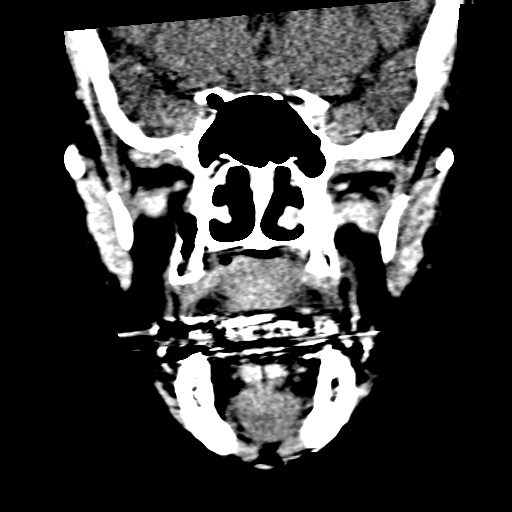

[Series 7: sagittal soft · sagittal · 0.33mm/px · 1 of 85 slices shown]
[im 43/85  brain]
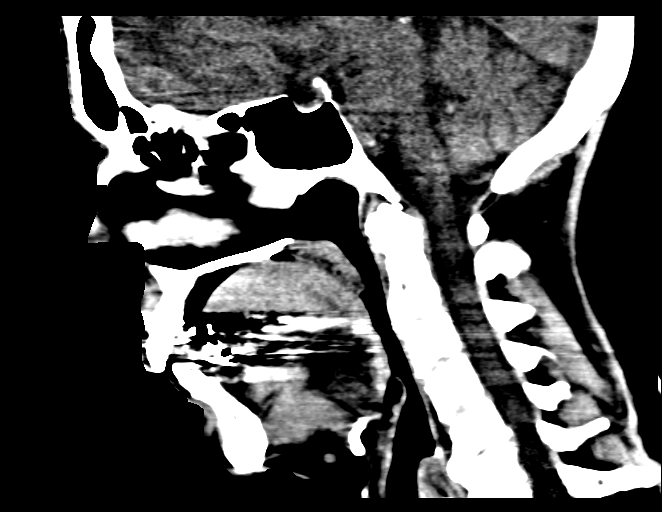

[16 of 47 positions shown; findings below may reference images not displayed]

FINDINGS: CT HEAD FINDINGS

No evidence for acute infarction, hemorrhage, mass lesion,
hydrocephalus, or extra-axial fluid. Mild cerebral cerebellar
atrophy. Moderately extensive hypoattenuation of white matter
consistent with small vessel disease or post treatment effect. No
skull fracture. Vascular calcification.

CT MAXILLOFACIAL FINDINGS

No facial fracture is evident. There is no sinus air-fluid level.
Mastoid air cells are clear. No orbital hematoma. BILATERAL cataract
extraction. Facial soft tissues unremarkable. No osseous destructive
process. BILATERAL TMJ arthritis. Cervical spondylosis most
pronounced at C5-C6. Multiple teeth are missing, presumed
periodontal disease.
IMPRESSION: Atrophy and white matter hypoattenuation are similar to 7577. No
acute intracranial findings.

No visible facial fracture or sinus air-fluid level. No osseous
destructive process.

## 2016-08-12 IMAGING — MR MR HEAD WO/W CM
12 series · 48 of 48 positions shown · IV contrast (multihance)
Comparison: 07/11/2014 head CT.  No comparison brain MR.

CLINICAL DATA: 73-year-old female post fall 04/17/2014 hitting
right-sided head. No with daily headaches on the right. History of
breast cancer and high cholesterol. Subsequent encounter.

EXAM:
MRI HEAD WITHOUT AND WITH CONTRAST
TECHNIQUE: Multiplanar, multiecho pulse sequences of the brain and surrounding
structures were obtained without and with intravenous contrast.
CONTRAST:  15mL MULTIHANCE GADOBENATE DIMEGLUMINE 529 MG/ML IV SOLN

[Series 2: T1 · sagittal · 5.0mm · 0.45mm/px · 4 of 27 slices shown (1 of 2)]
[im 1/27]
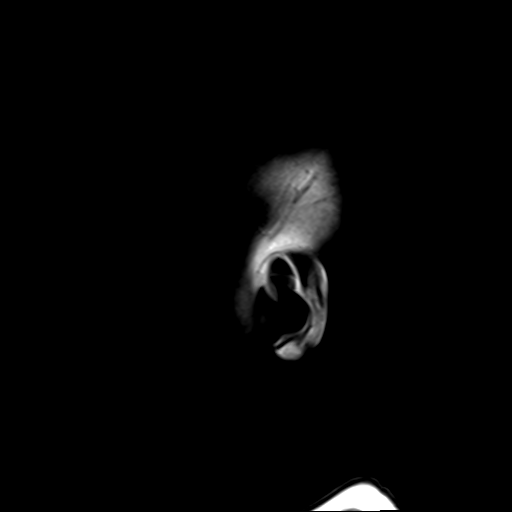
[im 9/27]
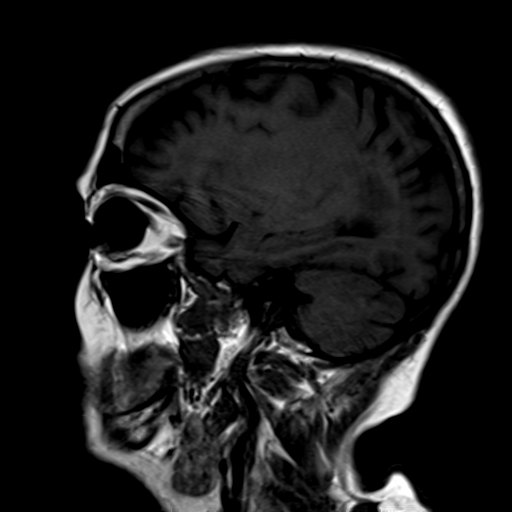
[im 18/27]
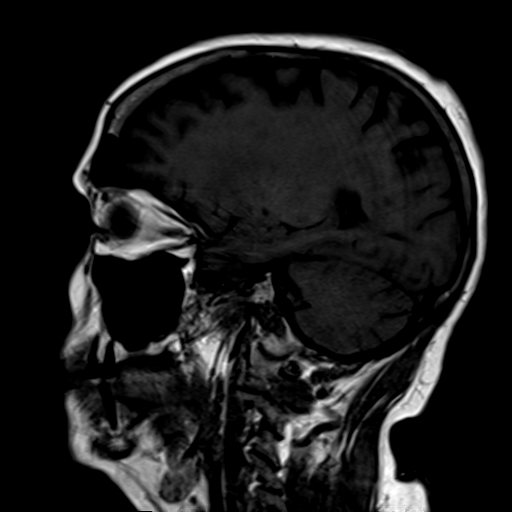
[im 27/27]
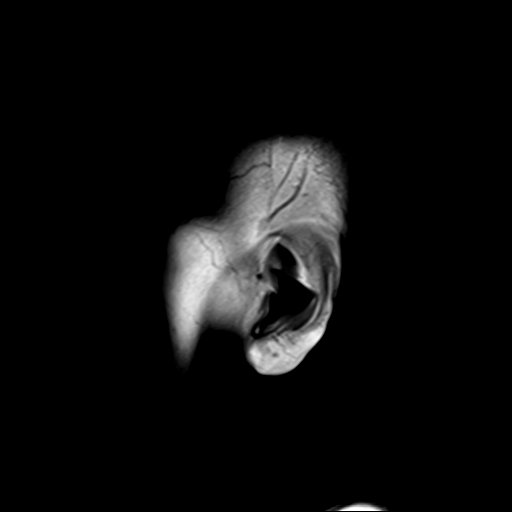

[Series 4: DWI · axial · 3.0mm · 1.80mm/px · z∈[-37,+120]mm · 5 of 53 slices shown (1 of 4)]
[im 1/53]
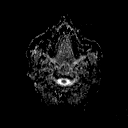
[im 14/53]
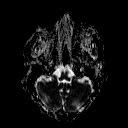
[im 27/53]
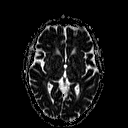
[im 40/53]
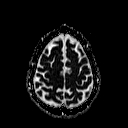
[im 53/53]
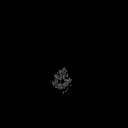

[Series 6: DWI · coronal · 3.0mm · 1.80mm/px · 5 of 49 slices shown (2 of 4)]
[im 1/49]
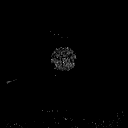
[im 13/49]
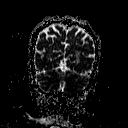
[im 25/49]
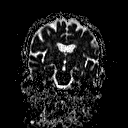
[im 37/49]
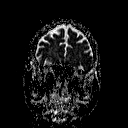
[im 49/49]
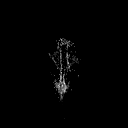

[Series 7: T2 · axial · 5.0mm · 0.60mm/px · z∈[-34,+117]mm · 2 of 25 slices shown (1 of 2)]
[im 1/25]
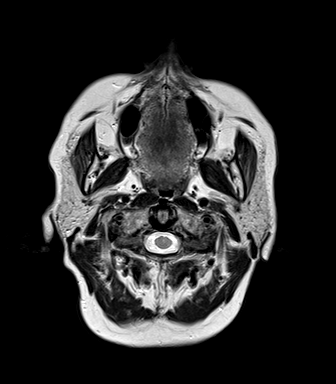
[im 25/25]
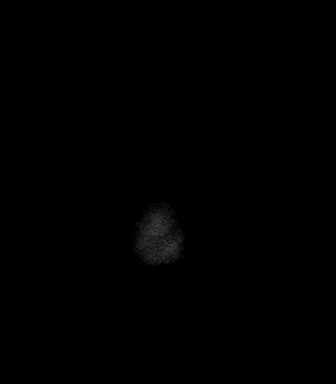

[Series 8: FLAIR · axial · 5.0mm · 0.45mm/px · z∈[-33,+118]mm · 2 of 25 slices shown]
[im 1/25]
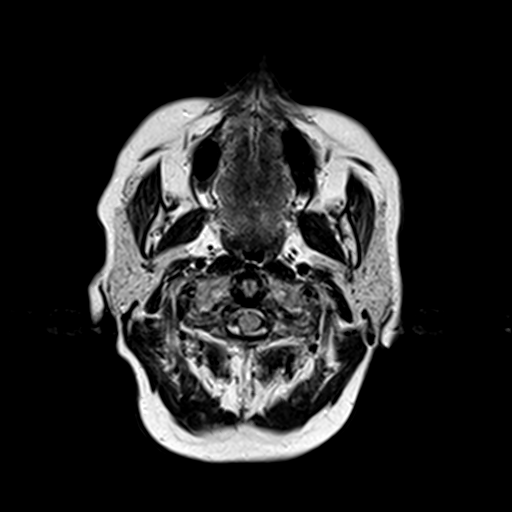
[im 25/25]
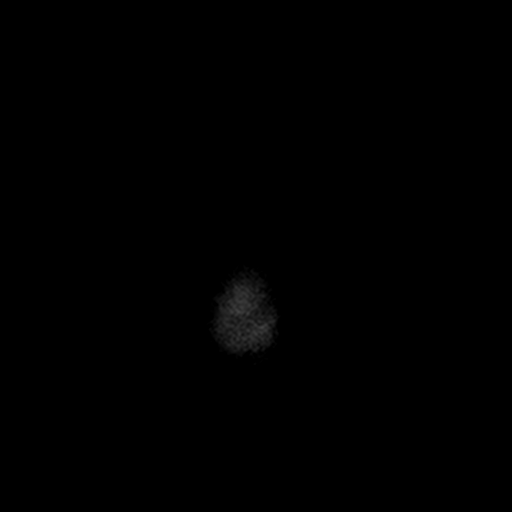

[Series 9: T2 · axial · 5.0mm · 0.45mm/px · z∈[-34,+117]mm · 2 of 25 slices shown (2 of 2)]
[im 1/25]
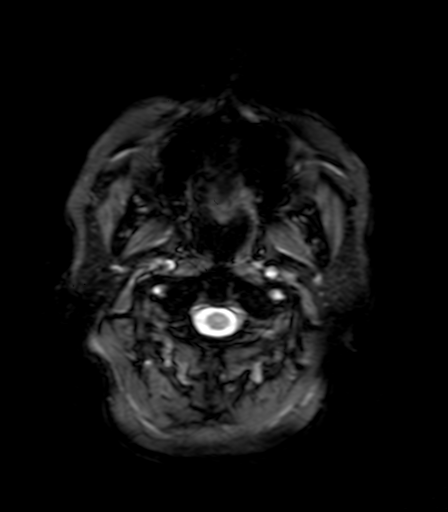
[im 25/25]
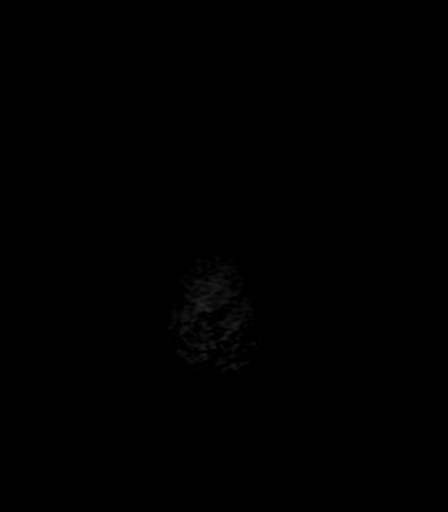

[Series 10: T1 · axial · 3.0mm · 1.00mm/px · z∈[-42,+130]mm · 6 of 60 slices shown (2 of 2)]
[im 1/60]
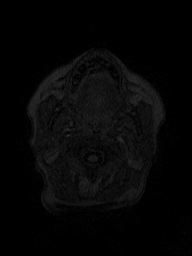
[im 12/60]
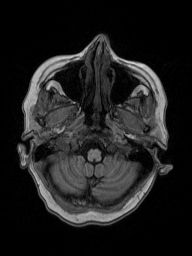
[im 24/60]
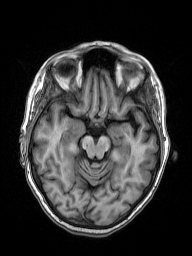
[im 36/60]
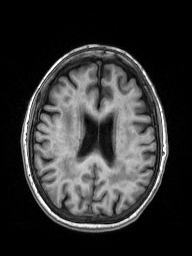
[im 48/60]
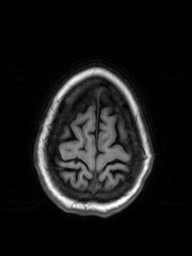
[im 60/60]
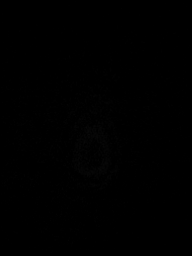

[Series 11: T2 post-contrast · coronal · 5.0mm · 0.49mm/px · 3 of 31 slices shown]
[im 1/31]
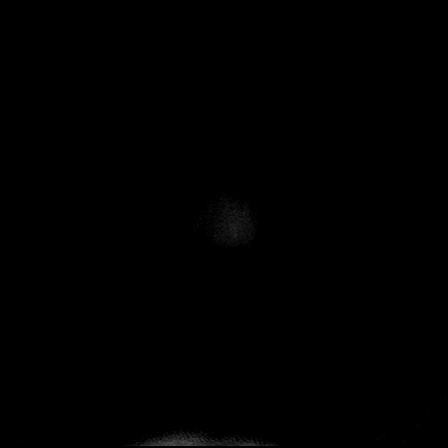
[im 16/31]
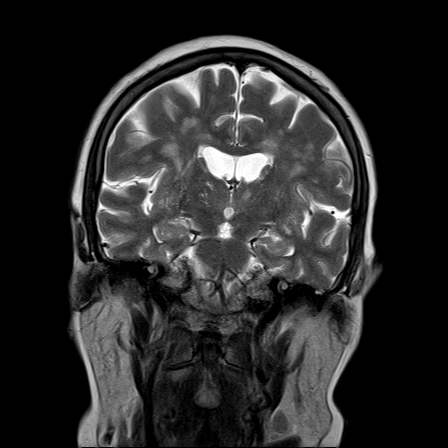
[im 31/31]
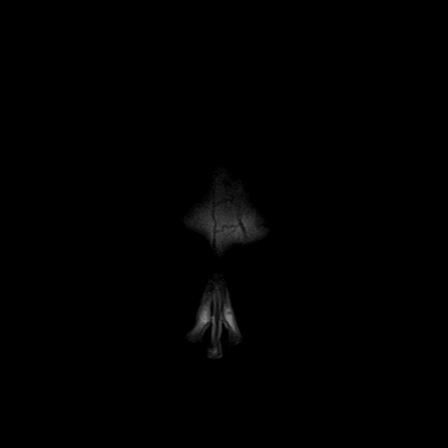

[Series 12: T1 post-contrast · axial · 3.0mm · 1.00mm/px · z∈[-42,+130]mm · 6 of 60 slices shown (1 of 2)]
[im 1/60]
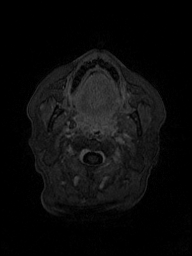
[im 12/60]
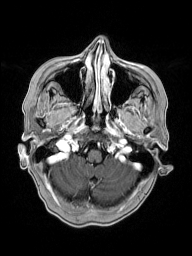
[im 24/60]
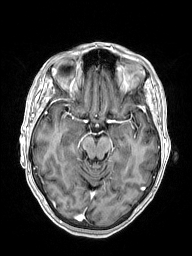
[im 36/60]
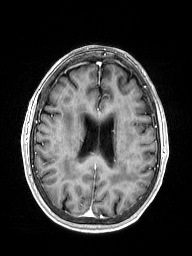
[im 48/60]
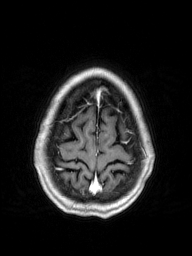
[im 60/60]
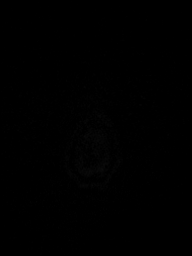

[Series 13: T1 post-contrast · coronal · 5.0mm · 0.43mm/px · 3 of 31 slices shown (2 of 2)]
[im 1/31]
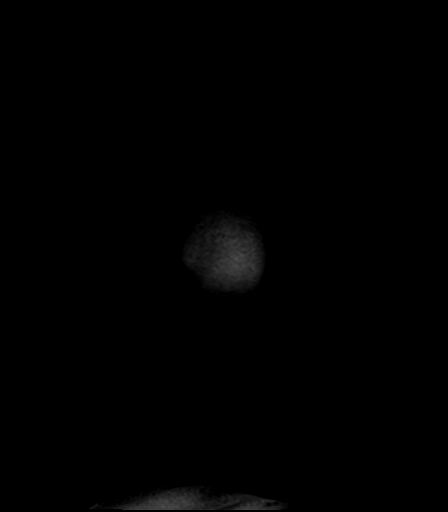
[im 16/31]
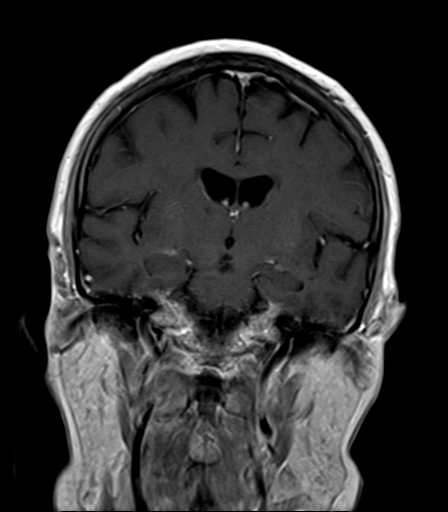
[im 31/31]
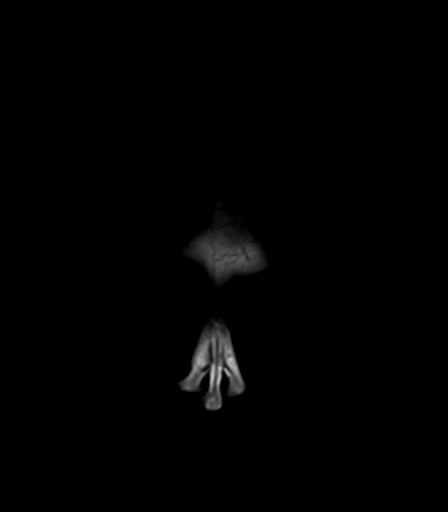

[Series 100: DWI · axial · 3.0mm · 1.80mm/px · z∈[-34,+120]mm · 5 of 54 slices shown (3 of 4)]
[im 1/54]
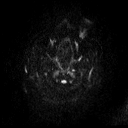
[im 14/54]
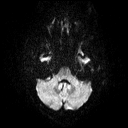
[im 27/54]
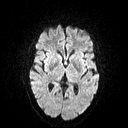
[im 40/54]
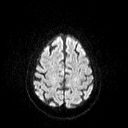
[im 54/54]
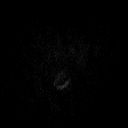

[Series 101: DWI · coronal · 3.0mm · 1.80mm/px · 5 of 49 slices shown (4 of 4)]
[im 1/49]
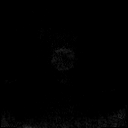
[im 13/49]
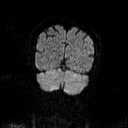
[im 25/49]
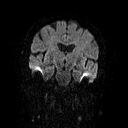
[im 37/49]
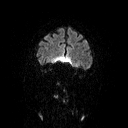
[im 49/49]
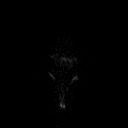

[48 of 48 positions shown; findings below may reference images not displayed]

FINDINGS: No acute infarct.

No intracranial hemorrhage or evidence of hemorrhagic shear type
injuries.

Significant white matter type changes which in a patient of this age
and history of high cholesterol is most likely explained by result
of small vessel disease. White matter changes are most prominent
periventricular and subcortical region but also involving portion of
the pons.

No intracranial mass or abnormal enhancement.

No hydrocephalus.

Major intracranial vascular structures are patent.

Post lens replacement otherwise orbital structures unremarkable.

Small pituitary gland incidentally noted.

Cervical medullary junction and pineal region unremarkable.
IMPRESSION: No acute infarct.

No intracranial hemorrhage or evidence of hemorrhagic shear type
injuries.

Significant white matter type changes most likely secondary to small
vessel disease.

No intracranial mass or abnormal enhancement.

## 2016-08-13 ENCOUNTER — Encounter: Payer: Self-pay | Admitting: Neurology

## 2016-08-13 ENCOUNTER — Ambulatory Visit (INDEPENDENT_AMBULATORY_CARE_PROVIDER_SITE_OTHER): Payer: Medicare Other | Admitting: Neurology

## 2016-08-13 ENCOUNTER — Encounter (INDEPENDENT_AMBULATORY_CARE_PROVIDER_SITE_OTHER): Payer: Self-pay

## 2016-08-13 VITALS — BP 98/70 | HR 72 | Ht 65.0 in | Wt 161.0 lb

## 2016-08-13 DIAGNOSIS — Z5181 Encounter for therapeutic drug level monitoring: Secondary | ICD-10-CM

## 2016-08-13 DIAGNOSIS — R5382 Chronic fatigue, unspecified: Secondary | ICD-10-CM | POA: Diagnosis not present

## 2016-08-13 DIAGNOSIS — E538 Deficiency of other specified B group vitamins: Secondary | ICD-10-CM | POA: Diagnosis not present

## 2016-08-13 DIAGNOSIS — R29898 Other symptoms and signs involving the musculoskeletal system: Secondary | ICD-10-CM | POA: Diagnosis not present

## 2016-08-13 DIAGNOSIS — R269 Unspecified abnormalities of gait and mobility: Secondary | ICD-10-CM | POA: Diagnosis not present

## 2016-08-13 NOTE — Patient Instructions (Signed)
   We will check blood work today and get EMG and NCV evaluation to look at the function of the nerves and muscles in the leg.

## 2016-08-13 NOTE — Progress Notes (Addendum)
Reason for visit: Gait disorder  Referring physician: Dr. Delsa Sale Angela David is a 76 y.o. female  History of present illness:  Angela David is a 76 year old right-handed white female with a history of diabetes. The patient reports some problems with numbness in the feet, she has a history of prior breast cancer that required physical therapy and radiation therapy. She has had some alteration in walking over the last year that has become particularly noticeable over the last 3 or 4 months. The patient is walking with the feet wide apart, she has had at least 4 falls within the last 4 months, she has hit her head with each fall. The patient does not use a cane for ambulation. She feels weak in the legs and feels that there is some fatigue aspect to walking, she will be better initially, but after walking only about 100 feet she has some onset of fatigue and is more likely to fall. The patient has difficulty getting up out of the chair or walking upstairs. She has been on a cholesterol lowering medication, this was discontinued recently. She denies any pain in the muscle but she does note some discomfort in the neck and down the mid back and low back. The patient denies issues controlling the bowels or the bladder. She does have some shortness of breath with walking. 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 This was done in 2012. She reports no history of headaches. She has a chronic issue with eye movements, she claims that she has never been able to track words on a page easily and has to move her entire head in order to do this. She has had MRI evaluation of the brain done in 2016 following a fall which showed fairly extensive small vessel ischemic changes. The patient has had some problems recently with hyponatremia with a level of 127 on diuretics such as Lasix and HCTZ. She is sent to this office for an evaluation.  Past Medical History:  Diagnosis Date  . Breast cancer  ([facility]) 2002   right breast chemo and radiation  . Cancer [facility]) 2002   right breast  . Glaucoma   . High cholesterol   . History of colon polyps   . Personal history of chemotherapy 2002   BREAST CA  . Personal history of malignant neoplasm of breast   . Personal history of radiation therapy 2002   BREAST CA  . Sleep apnea 2011    Past Surgical History:  Procedure Laterality Date  . ABDOMINAL HYSTERECTOMY    . BREAST BIOPSY Right 2008   neg  . BREAST EXCISIONAL BIOPSY Right 2002   positive  . BREAST SURGERY Right 2002   LUMPECTOMY, radiation, chemotherapy  . COLONOSCOPY  2009,12/30/2013  . HYDROCELE EXCISION / REPAIR    . TRACHEAL SURGERY  2013  . TRACHEOSTOMY N/A     Family History  Problem Relation Age of Onset  . Cancer Father        bone cancer  . Cancer Sister   . Cancer Brother   . Breast cancer Neg Hx     Social history:  reports that she has never smoked. She has never used smokeless tobacco. She reports that she does not drink alcohol or use drugs.  Medications:  Prior to Admission medications   Medication Sig Start Date End Date Taking? Authorizing Provider  Biotin 1000 MCG tablet Take by mouth.   Yes [provider]  busPIRone (  BUSPAR) 15 MG tablet Take 1 tablet by mouth 4 (four) times daily. 11/08/12  Yes [provider]  Calcium Carbonate-Vitamin D3 (CALCIUM 600-D) 600-400 MG-UNIT TABS Take 1 tablet by mouth daily.    Yes [provider]  cetirizine (ZYRTEC) 10 MG tablet Take 10 mg by mouth.   Yes [provider]  escitalopram (LEXAPRO) 20 MG tablet Take 20 mg by mouth. 01/13/15  Yes [provider]  ferrous sulfate 325 (65 FE) MG tablet Take 325 mg by mouth daily.   Yes [provider]  folic acid (FOLVITE) 1 MG tablet Take 1 mg by mouth daily.   Yes [provider]  furosemide (LASIX) 20 MG tablet Take 1 tablet by mouth daily. 05/02/16  Yes [provider]  gabapentin (NEURONTIN) 400  MG capsule Take 800 mg by mouth 2 (two) times daily. 01/04/16  Yes [provider]  hydrochlorothiazide (HYDRODIURIL) 25 MG tablet Take 25 mg by mouth daily.  03/11/15  Yes [provider]  LORazepam (ATIVAN) 0.5 MG tablet Take 1/2 pill (0.25 mg) in the morning, take 1 pill (0.5 mg) at night; may take an extra 1/2 pill (0.25 mg) once a day as needed for anxiety 01/13/15  Yes [provider]  metFORMIN (GLUCOPHAGE) 1000 MG tablet 1 tab by mouth in AM 1.5 tab by mouth in PM 11/07/12  Yes [provider]  metoprolol tartrate (LOPRESSOR) 25 MG tablet Take 1 tablet by mouth 2 (two) times daily. 11/17/12  Yes [provider]  NON FORMULARY Take 2 capsules by mouth daily. CereVen - dietary supplement - 2 cap per day. With NON GMO-soy products.   Yes [provider]  NON FORMULARY Take 3 capsules by mouth daily. Bili Ven dietary supplement for [employer] Detoxification - takes 3 capsules per day (1 cap in am, 1 at noon and 1 at dinner).   Yes [provider]  NON FORMULARY Take 3 capsules by mouth daily. Pancre Ven supplement to control blood glucose. Pt takes 3 capsules daily.   Yes [provider]  NON FORMULARY Take 6 capsules by mouth daily. Glysen K-1 (dietary supplement use as a multivitamin). Patient takes 2 capsules in am  And 2 capsules at noon and 2 capsules at bedtime.   Yes [provider]  NON FORMULARY Take 1 capsule by mouth. Immuno Ven- (cardiovascular supplement). Patient takes 1 capsule per day.   Yes [provider]  NONFORMULARY OR COMPOUNDED ITEM Take 1 oz by mouth 3 (three) times daily. Black Jerry Juice mixed with 1 ounce of water. Patient takes three times a day (am, noon and bedtime)   Yes [provider]  NONFORMULARY OR COMPOUNDED ITEM Take 5 mLs by mouth 3 (three) times daily. "Compounded Vitamin liquid Supplement to be mixed with 1/4 cup of water) three times a day to help with gallbladder  detoxifcation. Provided by chiropractor's office.   Yes [provider]  pantoprazole (PROTONIX) 40 MG tablet Take 40 mg by mouth daily.   Yes [provider]  pioglitazone (ACTOS) 15 MG tablet Take 15 mg by mouth daily.  02/08/15  Yes [provider]  RESTASIS 0.05 % ophthalmic emulsion  12/29/14  Yes [provider]  vitamin E 400 UNIT capsule Take 400 Units by mouth daily.    Yes [provider]      Allergies  Allergen Reactions  . Codeine     Per patient "it kept her up all night"  .  Eggs Or Egg-Derived Products     respitatory distress   . Shellfish Allergy     respitatory distress    . Sodium Sulfite Other (See Comments)    ROS:  Out of a complete 14 system review of symptoms, the patient complains only of the following symptoms, and all other reviewed systems are negative.  Allergies, runny nose Depression, anxiety  Blood pressure 98/70, pulse 72, height 5\' 5"  (1.651 m), weight 161 lb (73 kg).  Physical Exam  General: The patient is alert and cooperative at the time of the examination.  Eyes: Pupils are equal, round, and reactive to light. Discs are flat bilaterally.  Neck: The neck is supple, no carotid bruits are noted.  Respiratory: The respiratory examination is clear.  Cardiovascular: The cardiovascular examination reveals a regular rate and rhythm, no obvious murmurs or rubs are noted.  Skin: Extremities are with 1+ edema to ankles bilaterally.  Neurologic Exam  Mental status: The patient is alert and oriented x 3 at the time of the examination. The patient has apparent normal recent and remote memory, with an apparently normal attention span and concentration ability.  Cranial nerves: Facial symmetry is present. There is good sensation of the face to pinprick and soft touch bilaterally. The strength of the facial muscles and the muscles to head turning and shoulder shrug are normal bilaterally. Speech is well  enunciated, no aphasia or dysarthria is noted. Extraocular movements are notable for significant abnormalities with tracking objects horizontally, the patient has a severe vertical gaze paresis, she is unable to look up at all.[place] fields are full. The tongue is midline, and the patient has symmetric elevation of the soft palate. No obvious hearing deficits are noted.  Motor: The motor testing reveals 5 over 5 strength of all 4 extremities. Good symmetric motor tone is noted throughout. However, the patient is able to arise from a seated position with arms crossed only with some difficulty.  Sensory: Sensory testing is intact to pinprick, soft touch, vibration sensation, and position sense on all 4 extremities. No evidence of extinction is noted.  Coordination: Cerebellar testing reveals good finger-nose-finger and heel-to-shin bilaterally.  Gait and station: Gait is wide-based, unsteady. Tandem gait is very unsteady. Romberg is negative. No drift is seen.  Reflexes: Deep tendon reflexes are symmetric, but are slightly depressed bilaterally. Toes are downgoing bilaterally.   MRI brain 07/20/14:  IMPRESSION: No acute infarct.  No intracranial hemorrhage or evidence of hemorrhagic shear type injuries.  Significant white matter type changes most likely secondary to small vessel disease.  No intracranial mass or abnormal enhancement.  * MRI scan images were reviewed online. I agree with the written report.    Assessment/Plan:  1. 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 Hyponatremia  The patient has developed some significant gait instability issues. The patient does report some numbness in the feet, she will be set up for nerve conduction studies on both legs, EMG on one leg. Blood work will be done today. If the studies are unremarkable, MRI of the brain and cervical spine will be done. She  will follow-up for the EMG evaluation. We will have the patient set up for physical therapy for gait training.  Jill Alexanders MD 08/13/2016 2:08 PM  [facility] [address] [place], [place] [address]  Phone [phone] Fax [phone]

## 2016-08-28 ENCOUNTER — Ambulatory Visit (INDEPENDENT_AMBULATORY_CARE_PROVIDER_SITE_OTHER): Payer: Medicare Other | Admitting: Neurology

## 2016-08-28 ENCOUNTER — Encounter: Payer: Self-pay | Admitting: Neurology

## 2016-08-28 ENCOUNTER — Ambulatory Visit (INDEPENDENT_AMBULATORY_CARE_PROVIDER_SITE_OTHER): Payer: Self-pay | Admitting: Neurology

## 2016-08-28 DIAGNOSIS — G609 Hereditary and idiopathic neuropathy, unspecified: Secondary | ICD-10-CM

## 2016-08-28 DIAGNOSIS — E538 Deficiency of other specified B group vitamins: Secondary | ICD-10-CM

## 2016-08-28 DIAGNOSIS — G629 Polyneuropathy, unspecified: Secondary | ICD-10-CM | POA: Insufficient documentation

## 2016-08-28 DIAGNOSIS — R29898 Other symptoms and signs involving the musculoskeletal system: Secondary | ICD-10-CM

## 2016-08-28 DIAGNOSIS — R269 Unspecified abnormalities of gait and mobility: Secondary | ICD-10-CM

## 2016-08-28 DIAGNOSIS — E1140 Type 2 diabetes mellitus with diabetic neuropathy, unspecified: Secondary | ICD-10-CM | POA: Insufficient documentation

## 2016-08-28 DIAGNOSIS — E1142 Type 2 diabetes mellitus with diabetic polyneuropathy: Secondary | ICD-10-CM

## 2016-08-28 HISTORY — DX: Polyneuropathy, unspecified: G62.9

## 2016-08-28 HISTORY — DX: Type 2 diabetes mellitus with diabetic polyneuropathy: E11.42

## 2016-08-28 NOTE — Progress Notes (Signed)
The patient comes in for EMG and nerve conduction study evaluation. Study shows evidence of a peripheral neuropathy likely associated with diabetes. She claims her last hemoglobin A1c was 8.8.  The patient will be set up for physical therapy for gait training, she will have blood work done today.

## 2016-08-28 NOTE — Progress Notes (Signed)
Please refer to EMG and nerve conduction study procedure note. 

## 2016-08-28 NOTE — Procedures (Signed)
     HISTORY:  Angela David is a 76 year old patient with a history of diabetes who reports some numbness in the feet and a gait imbalance issue. The patient is being evaluated for a possible peripheral neuropathy.  NERVE CONDUCTION STUDIES:  Nerve conduction studies were performed on both lower extremities. The distal motor latencies for the peroneal nerves were prolonged on the right and absent on the left with a low motor amplitude on the right. The distal motor latencies for the posterior tibial nerves were prolonged bilaterally with low motor amplitudes for these nerves bilaterally. Slowing was seen for the right peroneal nerve and for the posterior tibial nerves bilaterally. The sural and peroneal sensory latencies were unobtainable bilaterally and the F wave latencies for the posterior tibial nerves were prolonged bilaterally.  EMG STUDIES:  EMG study was performed on the right lower extremity:  The tibialis anterior muscle reveals 2 to 5K motor units with decreased recruitment. No fibrillations or positive waves were seen. The peroneus tertius muscle reveals 2 to 5K motor units with decreased recruitment. No fibrillations or positive waves were seen. The medial gastrocnemius muscle reveals 2 to 4K motor units with decreased recruitment. No fibrillations or positive waves were seen. The vastus lateralis muscle reveals 2 to 4K motor units with full recruitment. No fibrillations or positive waves were seen. The iliopsoas muscle reveals 2 to 4K motor units with full recruitment. No fibrillations or positive waves were seen. The biceps femoris muscle (long head) reveals 2 to 4K motor units with full recruitment. No fibrillations or positive waves were seen. The lumbosacral paraspinal muscles were tested at 3 levels, and revealed no abnormalities of insertional activity at all 3 levels tested. There was fair relaxation.   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 EMG evaluation of the right lower extremity shows chronic stable signs of denervation below the knee consistent with the diagnosis of peripheral neuropathy. There is no evidence of an overlying lumbosacral radiculopathy.  Jill Alexanders MD 08/28/2016 11:26 AM  [facility] [address] [place], [place] [address]  Phone [phone] Fax [phone]

## 2016-08-30 LAB — COMPREHENSIVE METABOLIC PANEL
ALT: 19 IU/L (ref 0–32)
AST: 20 IU/L (ref 0–40)
Albumin/Globulin Ratio: 1.3 (ref 1.2–2.2)
Albumin: 4.3 g/dL (ref 3.5–4.8)
Alkaline Phosphatase: 77 IU/L (ref 39–117)
BUN/Creatinine Ratio: 16 (ref 12–28)
BUN: 16 mg/dL (ref 8–27)
Bilirubin Total: 0.4 mg/dL (ref 0.0–1.2)
CALCIUM: 10.1 mg/dL (ref 8.7–10.3)
CO2: 25 mmol/L (ref 20–29)
CREATININE: 1.03 mg/dL — AB (ref 0.57–1.00)
Chloride: 102 mmol/L (ref 96–106)
GFR calc Af Amer: 62 mL/min/{1.73_m2} (ref >59)
GFR, EST NON AFRICAN AMERICAN: 54 mL/min/{1.73_m2} — AB (ref >59)
GLOBULIN, TOTAL: 3.3 g/dL (ref 1.5–4.5)
Glucose: 99 mg/dL (ref 65–99)
Potassium: 5.3 mmol/L — ABNORMAL HIGH (ref 3.5–5.2)
SODIUM: 143 mmol/L (ref 134–144)
Total Protein: 7.6 g/dL (ref 6.0–8.5)

## 2016-08-30 LAB — MULTIPLE MYELOMA PANEL, SERUM
ALPHA 1: 0.2 g/dL (ref 0.0–0.4)
Albumin SerPl Elph-Mcnc: 3.8 g/dL (ref 2.9–4.4)
Albumin/Glob SerPl: 1.1 (ref 0.7–1.7)
Alpha2 Glob SerPl Elph-Mcnc: 0.9 g/dL (ref 0.4–1.0)
B-Globulin SerPl Elph-Mcnc: 1.4 g/dL — ABNORMAL HIGH (ref 0.7–1.3)
GLOBULIN, TOTAL: 3.8 g/dL (ref 2.2–3.9)
Gamma Glob SerPl Elph-Mcnc: 1.3 g/dL (ref 0.4–1.8)
IGA/IMMUNOGLOBULIN A, SERUM: 488 mg/dL — AB (ref 64–422)
IGM (IMMUNOGLOBULIN M), SRM: 86 mg/dL (ref 26–217)
IgG (Immunoglobin G), Serum: 1230 mg/dL (ref 700–1600)

## 2016-08-30 LAB — ANGIOTENSIN CONVERTING ENZYME: Angio Convert Enzyme: 62 U/L (ref 14–82)

## 2016-08-30 LAB — VITAMIN B12

## 2016-08-30 LAB — RHEUMATOID FACTOR

## 2016-08-30 LAB — ANA W/REFLEX: Anti Nuclear Antibody(ANA): NEGATIVE

## 2016-08-30 LAB — B. BURGDORFI ANTIBODIES: Lyme IgG/IgM Ab: <0.91 {ISR} (ref 0.00–0.90)

## 2016-08-30 LAB — RPR: RPR: NONREACTIVE

## 2016-08-30 LAB — CK: Total CK: 50 U/L (ref 24–173)

## 2016-08-30 LAB — SEDIMENTATION RATE: SED RATE: 2 mm/h (ref 0–40)

## 2016-09-07 ENCOUNTER — Telehealth: Payer: Self-pay | Admitting: Neurology

## 2016-09-07 NOTE — Telephone Encounter (Signed)
Patient called office to see if NCV/EMG results are completed.  Please call

## 2016-09-07 NOTE — Telephone Encounter (Signed)
Called pt back. Per CW,MD note on 08/28/16 relayed to patient  "Study shows evidence of a peripheral neuropathy likely associated with diabetes. She claims her last hemoglobin A1c was 8.8.  The patient will be set up for physical therapy for gait training, she will have blood work done today." Pt verbalized understanding.  I advised patient to make sure she is following up with PCP about diabetes management. She states she has PT appt this upcoming Monday. She has no further questions at this time.

## 2016-09-10 ENCOUNTER — Ambulatory Visit: Payer: Medicare Other | Attending: Neurology | Admitting: Rehabilitation

## 2016-09-10 ENCOUNTER — Encounter: Payer: Self-pay | Admitting: Rehabilitation

## 2016-09-10 DIAGNOSIS — M546 Pain in thoracic spine: Secondary | ICD-10-CM | POA: Diagnosis present

## 2016-09-10 DIAGNOSIS — R296 Repeated falls: Secondary | ICD-10-CM | POA: Diagnosis present

## 2016-09-10 DIAGNOSIS — M6281 Muscle weakness (generalized): Secondary | ICD-10-CM

## 2016-09-10 DIAGNOSIS — M542 Cervicalgia: Secondary | ICD-10-CM

## 2016-09-10 DIAGNOSIS — R2681 Unsteadiness on feet: Secondary | ICD-10-CM | POA: Diagnosis present

## 2016-09-10 DIAGNOSIS — R2689 Other abnormalities of gait and mobility: Secondary | ICD-10-CM | POA: Diagnosis present

## 2016-09-10 NOTE — Therapy (Signed)
[facility] [address] [place], [place], [address] Phone: [phone]   Fax:  [phone]  Physical Therapy Evaluation  Patient Details  Name: Angela David MRN: [id number] Date of Birth: 1941-02-25 Referring Provider: Margette Fast, MD  Encounter Date: 09/10/2016      PT End of Session - 09/10/16 1939    Visit Number 1   Number of Visits 17   Date for PT Re-Evaluation 11/09/16   Authorization Type MCR G Code on every 10th visit   PT Start Time 1402   PT Stop Time 1447   PT Time Calculation (min) 45 min   Activity Tolerance Patient tolerated treatment well   Behavior During Therapy [facility] for tasks assessed/performed      Past Medical History:  Diagnosis Date  . Breast cancer ([place]) 2002   right breast chemo and radiation  . Cancer [facility]) 2002   right breast  . Diabetic peripheral neuropathy ([place]) 08/28/2016  . Glaucoma   . High cholesterol   . History of colon polyps   . Peripheral neuropathy 08/28/2016  . Personal history of chemotherapy 2002   BREAST CA  . Personal history of malignant neoplasm of breast   . Personal history of radiation therapy 2002   BREAST CA  . Sleep apnea 2011    Past Surgical History:  Procedure Laterality Date  . ABDOMINAL HYSTERECTOMY    . BREAST BIOPSY Right 2008   neg  . BREAST EXCISIONAL BIOPSY Right 2002   positive  . BREAST SURGERY Right 2002   LUMPECTOMY, radiation, chemotherapy  . COLONOSCOPY  2009,12/30/2013  . HYDROCELE EXCISION / REPAIR    . TRACHEAL SURGERY  2013  . TRACHEOSTOMY N/A     There were no vitals filed for this visit.       Subjective Assessment - 09/10/16 1414    Subjective "I want to work on everything.  I need to work on my posture, I need to work on my balance because of the neuropathy."    Patient is accompained by: Family member  [name], husband   Limitations House hold activities;Walking;Standing   Patient Stated Goals "I want to  walk normal"    Currently in Pain? Yes   Pain Score 8    Pain Location Back   Pain Orientation Right;Left   Pain Descriptors / Indicators Burning;Sharp   Pain Type Chronic pain   Pain Radiating Towards upper back up into neck    Pain Onset More than a month ago   Pain Frequency Constant   Aggravating Factors  standing for long periods of time   Pain Relieving Factors massage and lying flat             [facility] PT Assessment - 09/10/16 0001      Assessment   Medical Diagnosis gait abnormality, back/neck pain   Referring Provider Margette Fast, MD   Onset Date/Surgical Date --  began in the last 3 months, back pain up to 6 months ago     Precautions   Precautions Fall   Precaution Comments has known diabetic neuropathy     Balance Screen   Has the patient fallen in the past 6 months Yes   How many times? 4   Has the patient had a decrease in activity level because of a fear of falling?  Yes   Is the patient reluctant to leave their home because of a fear of falling?  Yes     Home Environment  Living Environment Private residence   Living Arrangements Spouse/significant other   Available Help at Discharge Family;Available 24 hours/day  runs errands   Type of [place] entrance  in the front    Walker - single point  walk in shower      Prior Function   Level of [place] Retired   Leisure Would love to go out to eat, but trach gets in her way.  Would like to go to the beach.       Cognition   Overall Cognitive Status Within Functional Limits for tasks assessed     Observation/Other Assessments   Focus on Therapeutic Outcomes (FOTO)  ABC 2.5%     Sensation   Light Touch Impaired Detail   Light Touch Impaired Details Impaired RLE;Impaired LLE  up to knees, bilaterally   Hot/Cold Appears Intact   Proprioception Appears Intact     Coordination   Gross Motor Movements are  Fluid and Coordinated Yes  in LEs   Fine Motor Movements are Fluid and Coordinated Yes     Posture/Postural Control   Posture/Postural Control Postural limitations   Postural Limitations Forward head;Increased thoracic kyphosis  L lateral cervical flexion at rest     ROM / Strength   AROM / PROM / Strength Strength     Strength   Overall Strength Deficits   Overall Strength Comments B hip flex 3+/5, B knee ext 4/5, L knee flex 3/5, R knee flex 3+/5, B ankle DF 4/5     Palpation   Spinal mobility only grossly assessed cervical ROM in which she is limited with R lateral flex, R rotation and cervical extension.  Will assess more formally at next session.      Transfers   Transfers Sit to Stand;Stand to Sit   Sit to Stand 6: Modified independent (Device/Increase time)   Five time sit to stand comments  17.75 secs without UE support   Stand to Sit 6: Modified independent (Device/Increase time)     Ambulation/Gait   Ambulation/Gait Yes   Ambulation/Gait Assistance 5: Supervision;4: Min guard   Assistive device None   Gait Pattern Step-through pattern;Decreased stride length;Right foot flat;Left foot flat;Trunk flexed;Narrow base of support  keeps BLE in ER during gait, L lateral cervical flexion    Ambulation Surface Level;Indoor   Gait velocity 2.23 ft/sec without AD, close S    Stairs Yes   Stairs Assistance 5: Supervision;4: Min guard   Stairs Assistance Details (indicate cue type and reason) heavy reliance on UEs   Stair Management Technique Two rails;Alternating pattern;Forwards   Number of Stairs 4   Height of Stairs 6     Standardized Balance Assessment   Standardized Balance Assessment Timed Up and Go Test     Timed Up and Go Test   TUG Normal TUG   Normal TUG (seconds) 18.19  without AD, close S             Objective measurements completed on examination: See above findings.                  PT Education - 09/10/16 1938    Education provided  Yes   Education Details evaluation findings, POC, goals   Person(s) Educated Patient;Spouse   Methods Explanation   Comprehension Verbalized understanding          PT Short Term Goals - 09/10/16 1950  PT SHORT TERM GOAL #1   Title Pt will be independent with initial HEP in order to indicate improved functional mobility and decreased fall risk.  (Target Date: 10/10/16)   Time 4   Period Weeks   Status New     PT SHORT TERM GOAL #2   Title Will assess cervical ROM and set appropriate LTGs.    Time 4   Period Weeks   Status New     PT SHORT TERM GOAL #3   Title Pt will report no more than 6/10 upper thoracic/cervical back pain in order to indicate improved function.     Time 4   Period Weeks   Status New     PT SHORT TERM GOAL #4   Title Will assess DGI and improve score by 2 points in order to indicate decreased fall risk.     Time 4   Period Weeks   Status New     PT SHORT TERM GOAL #5   Title Pt will ambulate up to 200' over indoor surfaces w/ SPC at mod I level in order to indicate improved home negotiation.     Time 4   Period Weeks   Status New     Additional Short Term Goals   Additional Short Term Goals Yes     PT SHORT TERM GOAL #6   Title Pt will improve TUG to </=15 secs w/ LRAD in order to indicate decreased fall risk.     Time 4   Period Weeks   Status New     PT SHORT TERM GOAL #7   Title Pt will improve gait speed to >/=2.62 ft/sec w/ LRAD in order to indicate decreased fall risk and improved efficiency of gait.     Time 4   Period Weeks   Status New           PT Long Term Goals - 09/10/16 1954      PT LONG TERM GOAL #1   Title Pt will be independent with final HEP in order to indicate improved function and decreased fall risk.  (Target Date: 11/09/16)   Time 8   Period Weeks   Status New     PT LONG TERM GOAL #2   Title Pt will improve DGI by 4 points from baseline in order to indicate decreased fall risk.     Time 8   Period  Weeks   Status New     PT LONG TERM GOAL #3   Title Pt will report no more than 4/10 pain in upper thoracic/cervical region in order to indicate improved function.     Time 8   Period Weeks   Status New     PT LONG TERM GOAL #4   Title Pt will improve TUG </=13.5 secs w/ LRAD in order to indicate decreased fall risk.     Time 8   Period Weeks   Status New     PT LONG TERM GOAL #5   Title Pt will ambulate up to 500' over unlevel paved outdoor surfaces w/ LRAD at mod I level in order to indicate safe community negotiation.    Time 8   Period Weeks   Status New     Additional Long Term Goals   Additional Long Term Goals Yes     PT LONG TERM GOAL #6   Title Pt will improve ABC FOTO score to >/=50% in order to indicate self reported improved confidence in balance.  Time 8   Period Weeks   Status New                Plan - 09/10/16 1939    Clinical Impression Statement Pt presents with history of peripheral neuropathy and noted decreased in balance over last 3 months with 4 falls all resulting in hitting head (no LOC, laceration).  Also note she has poor posture with upper thoracic pain and increased L lateral cervical flexion at rest.   This pain has been around for approx 6 months.  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  Upon PT evaluation, note gait speed is 2.23 ft/sec with close S to min/guard A indiative of fall risk and decreased ability to ambulate safely in the community, 5TSS time of 17.75 secs indicative of fall risk and decreased functional strength, and TUG time of 18.19 secs, indicative of fall risk.  Pt will benefit from skilled OP neuro PT in order to address deficits.     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, states she has herniated disc but is unsure of level and this most recent imaging not  in EPIC)   Clinical Presentation Evolving   Clinical Presentation due to: see above   Clinical Decision Making Moderate   Rehab Potential Good   Clinical Impairments Affecting Rehab Potential co-morbidities   PT Frequency 2x / week   PT Duration 8 weeks   PT Treatment/Interventions ADLs/Self Care Home Management;Electrical Stimulation;Traction;Ultrasound;DME Instruction;Gait training;Stair training;Functional mobility training;Therapeutic activities;Therapeutic exercise;Balance training;Neuromuscular re-education;Patient/family education;Manual techniques;Passive range of motion;Dry needling;Energy conservation;Vestibular;Other (comment)  thoracic grade V manipulations   PT Next Visit Plan Assess cervical/thoracic spine more thoroughly and look at goals (get ROM, assess joint mobility, mob as able), She sleeps elevated with head forward and neck in laterally flexed position-education to correct this, DGI-set goals, HEP for postural strength and balance, BLE strength.     Consulted and Agree with Plan of Care Patient;Family member/caregiver   Family Member Consulted Husband [name]      Patient will benefit from skilled therapeutic intervention in order to improve the following deficits and impairments:  Abnormal gait, Decreased activity tolerance, Decreased balance, Decreased endurance, Decreased knowledge of use of DME, Decreased mobility, Decreased range of motion, Decreased safety awareness, Decreased strength, Hypomobility, Impaired perceived functional ability, Impaired flexibility, Impaired sensation, Improper body mechanics, Postural dysfunction, Pain  Visit Diagnosis: Muscle weakness (generalized) - Plan: PT plan of care cert/re-cert  Unsteadiness on feet - Plan: PT plan of care cert/re-cert  Other abnormalities of gait and mobility - Plan: PT plan of care cert/re-cert  Cervicalgia - Plan: PT plan of care cert/re-cert  Pain in thoracic spine - Plan: PT plan of care  cert/re-cert  Repeated falls - Plan: PT plan of care cert/re-cert      G-Codes - 02/72/53 1999/06/02    Functional Assessment Tool Used (Outpatient Only) TUG: 18.19 secs with close S, gait speed 2.23 ft/sec with S to min/guard A    Functional Limitation Mobility: Walking and moving around   Mobility: Walking and Moving Around Current Status [id number]) At least 40 percent but less than 60 percent impaired, limited or restricted   Mobility: Walking and Moving Around Goal Status ([id number]) At least 1 percent but less than 20 percent impaired, limited or restricted       Problem List Patient Active  Problem List   Diagnosis Date Noted  . Peripheral neuropathy 08/28/2016  . Diabetic peripheral neuropathy ([place]) 08/28/2016  . Leg weakness, bilateral 08/13/2016  . Gait abnormality 08/13/2016  . Carcinoma of overlapping sites of right breast in female, estrogen receptor negative ([place]) 12/27/2015  . Malignant neoplasm of breast ([place]) 03/28/2015  . 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 Hypercholesterolemia 03/28/2015  . Recurrent major depressive disorder, in partial remission ([place]) 03/01/2015  . 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 Cephalalgia 09/15/2014  . Headache due to trauma 09/15/2014  . Osteoporosis of multiple sites 01/07/2014  . History of colonic polyps 12/14/2013  . History of breast cancer 11/26/2012  . H/O malignant neoplasm of breast 11/26/2012  . 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 Tracheostomy present ([place]) 12/06/2011  . Calcification of bronchus or trachea 01/16/2011  . 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 Anxiety state 12/19/2010  . Blood in sputum 12/19/2010  . HLD (hyperlipidemia) 12/19/2010  . Type 2 diabetes mellitus ([place]) 12/19/2010    Cameron Sprang, PT, MPT [facility] [address] [place], [place], [address] Phone: [phone]   Fax:  [phone] 09/10/16, 8:08 PM  Name: Angela David MRN: [id number] Date of Birth: 04-04-1940

## 2016-09-17 ENCOUNTER — Other Ambulatory Visit: Payer: Self-pay | Admitting: Orthopedic Surgery

## 2016-09-17 ENCOUNTER — Encounter: Payer: Self-pay | Admitting: Rehabilitation

## 2016-09-17 ENCOUNTER — Ambulatory Visit: Payer: Medicare Other | Admitting: Rehabilitation

## 2016-09-17 DIAGNOSIS — M5412 Radiculopathy, cervical region: Secondary | ICD-10-CM

## 2016-09-17 DIAGNOSIS — M6281 Muscle weakness (generalized): Secondary | ICD-10-CM

## 2016-09-17 DIAGNOSIS — R2689 Other abnormalities of gait and mobility: Secondary | ICD-10-CM

## 2016-09-17 DIAGNOSIS — M542 Cervicalgia: Secondary | ICD-10-CM

## 2016-09-17 DIAGNOSIS — M546 Pain in thoracic spine: Secondary | ICD-10-CM

## 2016-09-17 NOTE — Therapy (Signed)
[facility] [address] [place], [place], [address] Phone: [phone]   Fax:  [phone]  Physical Therapy Treatment  Patient Details  Name: Angela David MRN: [id number] Date of Birth: 17-Jun-1940 Referring Provider: Margette Fast, MD  Encounter Date: 09/17/2016      PT End of Session - 09/17/16 1130    Visit Number 2   Number of Visits 17   Date for PT Re-Evaluation 11/09/16   Authorization Type MCR G Code on every 10th visit   PT Start Time 0932   PT Stop Time 1016   PT Time Calculation (min) 44 min   Activity Tolerance Patient tolerated treatment well   Behavior During Therapy [facility] for tasks assessed/performed      Past Medical History:  Diagnosis Date  . Breast cancer ([place]) 2002   right breast chemo and radiation  . Cancer [facility]) 2002   right breast  . Diabetic peripheral neuropathy ([place]) 08/28/2016  . Glaucoma   . High cholesterol   . History of colon polyps   . Peripheral neuropathy 08/28/2016  . Personal history of chemotherapy 2002   BREAST CA  . Personal history of malignant neoplasm of breast   . Personal history of radiation therapy 2002   BREAST CA  . Sleep apnea 2011    Past Surgical History:  Procedure Laterality Date  . ABDOMINAL HYSTERECTOMY    . BREAST BIOPSY Right 2008   neg  . BREAST EXCISIONAL BIOPSY Right 2002   positive  . BREAST SURGERY Right 2002   LUMPECTOMY, radiation, chemotherapy  . COLONOSCOPY  2009,12/30/2013  . HYDROCELE EXCISION / REPAIR    . TRACHEAL SURGERY  2013  . TRACHEOSTOMY N/A     There were no vitals filed for this visit.      Subjective Assessment - 09/17/16 0937    Subjective I went to the orthopedic MD in [place] last week, they did an xray and I have a slipped disc on the R side at C6/7.  They started me on Prednisone and this is my last day.     Patient is accompained by: Family member   Limitations House hold  activities;Walking;Standing   Patient Stated Goals "I want to walk normal"    Currently in Pain? Yes   Pain Score 5    Pain Location Thoracic   Pain Orientation Right;Left  R>L   Pain Descriptors / Indicators Burning   Pain Type Chronic pain   Pain Radiating Towards in between shoulder blades    Pain Onset More than a month ago   Pain Frequency Constant   Aggravating Factors  standing and doing too much around home.    Pain Relieving Factors resting             [facility] PT Assessment - 09/17/16 0001      ROM / Strength   AROM / PROM / Strength AROM     AROM   Overall AROM  Deficits   AROM Assessment Site Cervical   Cervical Flexion 24   Cervical Extension 38   Cervical - Right Rotation 30  pain with this movement   Cervical - Left Rotation 42                     [facility] Adult PT Treatment/Exercise - 09/17/16 0001      Self-Care   Self-Care Posture   Posture Discussed posture while in bed.  Note that she has to sleep elevated  to at least 30 deg due to trach, however she is "falling" to the R with head/neck/shoulders.  Recommend she lie as flat as possible and attempt to utilize neck pillow to prevent her head and neck from tilting to the R.  Pt and husband verbalized understanding.  Also educated on husband assisting when able if pt is asleep or has just gone to restroom and settling back into bed.       Exercises   Exercises Neck     Neck Exercises: Seated   Cervical Rotation Both;5 reps;Limitations   Cervical Rotation Limitations Pt with limitations to the R, therefore cued to perform to tolerance and hold there.  Added to HEP.    Lateral Flexion Right;10 reps   Lateral Flexion Limitations with 2-3 sec holds.  Provided for HEP   Shoulder Rolls Backwards;10 reps   Shoulder Rolls Limitations tactile and demonstration for correct technique.  Added to HEP   Other Seated Exercise Encouraged all seated exercises be done in front of mirror and with her sitting in  chair with back to prevent compensations or poor technique and for more midline posture.       Neck Exercises: Supine   Neck Retraction 10 reps;5 secs   Neck Retraction Limitations Mod cues and demonstration for correct technique, but she was able to perform 10 reps with good technique in supine.  Would like to add this to HEP as able.       Manual Therapy   Manual Therapy Joint mobilization;Manual Traction;Myofascial release   Manual therapy comments Assessed cervical spinal mobility during today's session.  Note that she is unable to lie prone due to trach, therefore assessed in supine.  Note limited mobility with all AP movement, but was highly limited by tenderness to palpation.  Also note that with lateral glide, she is more limited on the R with more pain, but was able to tolerate mobility on the L.     Joint Mobilization Attempted AP joint mobs (grade I) however pt very tender throughout whole C spine.  Then performed very light grade I mobs (lateral glide) to whole C spine which was tender but she did tolerate and did 2 more rounds of this going to grade II for second and third rounds.  Note improved mobility and better tolerance following third round and more midline neck posture.     Myofascial Release Performed suboccipital release with reported pain relief.  Provided and performed with tennis balls during session and provided for HEP.     Manual Traction Provided manual traction to C-spine x 2-3 mins to decrease pain and improve tissue flexibility.  Pt reports decreased pain.                  PT Education - 09/17/16 1130    Education provided Yes   Education Details cervical HEP and sleeping position   Person(s) Educated Patient;Spouse   Methods Explanation;Demonstration;Handout   Comprehension Returned demonstration;Verbalized understanding;Need further instruction          PT Short Term Goals - 09/17/16 1133      PT SHORT TERM GOAL #1   Title Pt will be independent  with initial HEP in order to indicate improved functional mobility and decreased fall risk.  (Target Date: 10/10/16)   Time 4   Period Weeks   Status New     PT SHORT TERM GOAL #2   Title Will assess cervical ROM and set appropriate LTGs.    Time 4  Period Weeks   Status Achieved     PT SHORT TERM GOAL #3   Title Pt will report no more than 6/10 upper thoracic/cervical back pain in order to indicate improved function.     Time 4   Period Weeks   Status New     PT SHORT TERM GOAL #4   Title Will assess DGI and improve score by 2 points in order to indicate decreased fall risk.     Time 4   Period Weeks   Status New     PT SHORT TERM GOAL #5   Title Pt will ambulate up to 200' over indoor surfaces w/ SPC at mod I level in order to indicate improved home negotiation.     Time 4   Period Weeks   Status New     PT SHORT TERM GOAL #6   Title Pt will improve TUG to </=15 secs w/ LRAD in order to indicate decreased fall risk.     Time 4   Period Weeks   Status New     PT SHORT TERM GOAL #7   Title Pt will improve gait speed to >/=2.62 ft/sec w/ LRAD in order to indicate decreased fall risk and improved efficiency of gait.     Time 4   Period Weeks   Status New           PT Long Term Goals - 09/17/16 1133      PT LONG TERM GOAL #1   Title Pt will be independent with final HEP in order to indicate improved function and decreased fall risk.  (Target Date: 11/09/16)   Time 8   Period Weeks   Status New     PT LONG TERM GOAL #2   Title Pt will improve DGI by 4 points from baseline in order to indicate decreased fall risk.     Time 8   Period Weeks   Status New     PT LONG TERM GOAL #3   Title Pt will report no more than 4/10 pain in upper thoracic/cervical region in order to indicate improved function.     Time 8   Period Weeks   Status New     PT LONG TERM GOAL #4   Title Pt will improve TUG </=13.5 secs w/ LRAD in order to indicate decreased fall risk.     Time  8   Period Weeks   Status New     PT LONG TERM GOAL #5   Title Pt will ambulate up to 500' over unlevel paved outdoor surfaces w/ LRAD at mod I level in order to indicate safe community negotiation.    Time 8   Period Weeks   Status New     PT LONG TERM GOAL #6   Title Pt will improve ABC FOTO score to >/=50% in order to indicate self reported improved confidence in balance.     Time 8   Period Weeks   Status New     PT LONG TERM GOAL #7   Title Pt will improve cervical ROM (flex/ext/R/Lrotation) by 10 deg in order to indicate improved functional and decreased pain.     Time 8   Period Weeks   Status New               Plan - 09/17/16 1131    Clinical Impression Statement Skilled session focused on more in depth assessment of cervical mobility and reducing pain.  Pt limited with all  cervical AROM, esp on the R side with more pain noted.  Pt tender to palpation with AP plane, however was able to tolerate lateral glides for improved spinal mobility.  Provided HEP to address ROM and pain, see pt instruction.     Rehab Potential Good   Clinical Impairments Affecting Rehab Potential co-morbidities   PT Frequency 2x / week   PT Duration 8 weeks   PT Treatment/Interventions ADLs/Self Care Home Management;Electrical Stimulation;Traction;Ultrasound;DME Instruction;Gait training;Stair training;Functional mobility training;Therapeutic activities;Therapeutic exercise;Balance training;Neuromuscular re-education;Patient/family education;Manual techniques;Passive range of motion;Dry needling;Energy conservation;Vestibular;Other (comment)  thoracic grade V manipulations   PT Next Visit Plan Continue with gentle cervical/thoracic mobilization as able (unable to lie prone due to trach), postural exercises, DGI-set goals, HEP for postural strength and balance, BLE strength.     Consulted and Agree with Plan of Care Patient;Family member/caregiver   Family Member Consulted Husband [name]       Patient will benefit from skilled therapeutic intervention in order to improve the following deficits and impairments:  Abnormal gait, Decreased activity tolerance, Decreased balance, Decreased endurance, Decreased knowledge of use of DME, Decreased mobility, Decreased range of motion, Decreased safety awareness, Decreased strength, Hypomobility, Impaired perceived functional ability, Impaired flexibility, Impaired sensation, Improper body mechanics, Postural dysfunction, Pain  Visit Diagnosis: Muscle weakness (generalized)  Cervicalgia  Other abnormalities of gait and mobility  Pain in thoracic spine     Problem List Patient Active Problem List   Diagnosis Date Noted  . Peripheral neuropathy 08/28/2016  . Diabetic peripheral neuropathy ([place]) 08/28/2016  . Leg weakness, bilateral 08/13/2016  . Gait abnormality 08/13/2016  . Carcinoma of overlapping sites of right breast in female, estrogen receptor negative ([place]) 12/27/2015  . Malignant neoplasm of breast ([place]) 03/28/2015  . 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 Hypercholesterolemia 03/28/2015  . Recurrent major depressive disorder, in partial remission ([place]) 03/01/2015  . 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 Cephalalgia 09/15/2014  . Headache due to trauma 09/15/2014  . Osteoporosis of multiple sites 01/07/2014  . History of colonic polyps 12/14/2013  . History of breast cancer 11/26/2012  . H/O malignant neoplasm of breast 11/26/2012  . 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 Tracheostomy present ([place]) 12/06/2011  . Calcification of bronchus or trachea 01/16/2011  . 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 Anxiety state 12/19/2010  . Blood in sputum 12/19/2010  . HLD (hyperlipidemia) 12/19/2010  . Type 2 diabetes mellitus ([place]) 12/19/2010    Cameron Sprang, PT, MPT [facility] [address] [place], [place], [address] Phone: [phone]   Fax:   [phone] 09/17/16, 11:36 AM  Name: Milee Qualls MRN: [id number] Date of Birth: [birth year]

## 2016-09-17 NOTE — Patient Instructions (Signed)
   tennis ball suboccipital release  Put two tennis balls in a sock. Lay on your back in bed with a pillow under your knees. Place the tennis balls on a pillow and under your head just below the ledge at the back of your head.  Rest there for 5 minutes.   For these next exercises, sit in a chair with a back in front of a mirror if you can.  When you finish the exercise, make sure you return your head to the "middle."   AROM: Lateral Neck Flexion     Slowly tilt head toward Right shoulder, not shoulder towards ear!!   Hold each position __5__ seconds. Repeat _10___ times per set. Do _1___ sets per session. Do __1-2__ sessions per day.  http://orth.exer.us/297   Copyright  VHI. All rights reserved.    AROM: Neck Rotation    Turn head slowly to look over one shoulder, then the other. Hold each position __5__ seconds. Repeat __10__ times per set. Do __1__ sets per session. Do _1-2___ sessions per day.  http://orth.exer.us/295   Copyright  VHI. All rights reserved.   Healthy Back - Shoulder Roll    DO THIS SITTING!! Stand straight with arms relaxed at sides. Roll shoulders backward continuously. Do __10__ times. Increase repetitions gradually up to _15___. Do both shoulders at the same time.    Copyright  VHI. All rights reserved.

## 2016-09-21 ENCOUNTER — Encounter: Payer: Self-pay | Admitting: Rehabilitation

## 2016-09-21 ENCOUNTER — Ambulatory Visit: Payer: Medicare Other | Admitting: Rehabilitation

## 2016-09-21 DIAGNOSIS — R2681 Unsteadiness on feet: Secondary | ICD-10-CM

## 2016-09-21 DIAGNOSIS — M6281 Muscle weakness (generalized): Secondary | ICD-10-CM

## 2016-09-21 DIAGNOSIS — M542 Cervicalgia: Secondary | ICD-10-CM

## 2016-09-21 DIAGNOSIS — M546 Pain in thoracic spine: Secondary | ICD-10-CM

## 2016-09-21 DIAGNOSIS — R2689 Other abnormalities of gait and mobility: Secondary | ICD-10-CM

## 2016-09-21 NOTE — Therapy (Signed)
[facility] [address] [place], [place], [address] Phone: [phone]   Fax:  [phone]  Physical Therapy Treatment  Patient Details  Name: Angela David MRN: [id number] Date of Birth: 1940-11-09 Referring Provider: Margette Fast, MD  Encounter Date: 09/21/2016      PT End of Session - 09/21/16 1738    Visit Number 3   Number of Visits 17   Date for PT Re-Evaluation 11/09/16   Authorization Type MCR G Code on every 10th visit   PT Start Time 1155   PT Stop Time 1240   PT Time Calculation (min) 45 min   Activity Tolerance Patient tolerated treatment well   Behavior During Therapy [facility] for tasks assessed/performed      Past Medical History:  Diagnosis Date  . Breast cancer ([place]) 2002   right breast chemo and radiation  . Cancer [facility]) 2002   right breast  . Diabetic peripheral neuropathy ([place]) 08/28/2016  . Glaucoma   . High cholesterol   . History of colon polyps   . Peripheral neuropathy 08/28/2016  . Personal history of chemotherapy 2002   BREAST CA  . Personal history of malignant neoplasm of breast   . Personal history of radiation therapy 2002   BREAST CA  . Sleep apnea 2011    Past Surgical History:  Procedure Laterality Date  . ABDOMINAL HYSTERECTOMY    . BREAST BIOPSY Right 2008   neg  . BREAST EXCISIONAL BIOPSY Right 2002   positive  . BREAST SURGERY Right 2002   LUMPECTOMY, radiation, chemotherapy  . COLONOSCOPY  2009,12/30/2013  . HYDROCELE EXCISION / REPAIR    . TRACHEAL SURGERY  2013  . TRACHEOSTOMY N/A     There were no vitals filed for this visit.      Subjective Assessment - 09/21/16 1202    Subjective Has MRI scheduled on Monday, doesn't have MD appt until the 13th.    Patient is accompained by: Family member   Limitations House hold activities;Walking;Standing   Patient Stated Goals "I want to walk normal"    Currently in Pain? Yes   Pain Score 5    Pain Location  Back   Pain Orientation Right;Left;Upper   Pain Descriptors / Indicators Burning   Pain Type Chronic pain   Pain Onset More than a month ago   Pain Frequency Constant   Aggravating Factors  standing , doing too much   Pain Relieving Factors resting                         [facility] Adult PT Treatment/Exercise - 09/21/16 1239      Standardized Balance Assessment   Standardized Balance Assessment Dynamic Gait Index     Dynamic Gait Index   Level Surface Mild Impairment   Change in Gait Speed Mild Impairment   Gait with Horizontal Head Turns Mild Impairment   Gait with Vertical Head Turns Mild Impairment   Gait and Pivot Turn Mild Impairment   Step Over Obstacle Moderate Impairment   Step Around Obstacles Mild Impairment   Steps Mild Impairment   Total Score 15     Self-Care   Self-Care Posture   Posture Continue to discuss posture during all mobility and ADLs and how to modify standing tasks to ensure proper posture.  Pt and husband verbalized understanding.       Exercises   Exercises Neck     Neck Exercises: Seated  Lateral Flexion Right;10 reps   Lateral Flexion Limitations with 2-3 sec holds.  Provided for HEP     Neck Exercises: Supine   Other Supine Exercise Had her perform supine pectoral stretch/self thoracic mob with towel x 3 mins.  Provided this for HEP.  Then placed small towel roll in perpendicular position to thoracic spine and had her lie over this for improved thoracic mobility.  Performed x 2 mins.  R SL with head on low pillow (towel under shoulder to increase ROM) for L lateral stretch x 2 mins.       Manual Therapy   Manual Therapy Manual Traction;Other (comment)   Manual Traction manual traction to cervical spine x 2 mins.  tolerated well.     Other Manual Therapy Provided light manual traction with R lateral cervical flexion to improve ROM.  Performed x 5 reps with 5 sec holds.  Tolerated well.                  PT Education -  09/21/16 1738    Education provided Yes   Education Details see self care, results of DGI   Person(s) Educated Patient;Spouse   Methods Explanation;Demonstration;Handout   Comprehension Verbalized understanding;Returned demonstration          PT Short Term Goals - 09/21/16 1740      PT SHORT TERM GOAL #1   Title Pt will be independent with initial HEP in order to indicate improved functional mobility and decreased fall risk.  (Target Date: 10/10/16)   Time 4   Period Weeks   Status New     PT SHORT TERM GOAL #2   Title Will assess cervical ROM and set appropriate LTGs.    Time 4   Period Weeks   Status Achieved     PT SHORT TERM GOAL #3   Title Pt will report no more than 6/10 upper thoracic/cervical back pain in order to indicate improved function.     Time 4   Period Weeks   Status New     PT SHORT TERM GOAL #4   Title Will assess DGI and improve score by 2 points in order to indicate decreased fall risk.     Baseline 15/24 on 09/21/16   Time 4   Period Weeks   Status New     PT SHORT TERM GOAL #5   Title Pt will ambulate up to 200' over indoor surfaces w/ SPC at mod I level in order to indicate improved home negotiation.     Time 4   Period Weeks   Status New     PT SHORT TERM GOAL #6   Title Pt will improve TUG to </=15 secs w/ LRAD in order to indicate decreased fall risk.     Time 4   Period Weeks   Status New     PT SHORT TERM GOAL #7   Title Pt will improve gait speed to >/=2.62 ft/sec w/ LRAD in order to indicate decreased fall risk and improved efficiency of gait.     Time 4   Period Weeks   Status New           PT Long Term Goals - 09/17/16 1133      PT LONG TERM GOAL #1   Title Pt will be independent with final HEP in order to indicate improved function and decreased fall risk.  (Target Date: 11/09/16)   Time 8   Period Weeks   Status New  PT LONG TERM GOAL #2   Title Pt will improve DGI by 4 points from baseline in order to indicate  decreased fall risk.     Time 8   Period Weeks   Status New     PT LONG TERM GOAL #3   Title Pt will report no more than 4/10 pain in upper thoracic/cervical region in order to indicate improved function.     Time 8   Period Weeks   Status New     PT LONG TERM GOAL #4   Title Pt will improve TUG </=13.5 secs w/ LRAD in order to indicate decreased fall risk.     Time 8   Period Weeks   Status New     PT LONG TERM GOAL #5   Title Pt will ambulate up to 500' over unlevel paved outdoor surfaces w/ LRAD at mod I level in order to indicate safe community negotiation.    Time 8   Period Weeks   Status New     PT LONG TERM GOAL #6   Title Pt will improve ABC FOTO score to >/=50% in order to indicate self reported improved confidence in balance.     Time 8   Period Weeks   Status New     PT LONG TERM GOAL #7   Title Pt will improve cervical ROM (flex/ext/R/Lrotation) by 10 deg in order to indicate improved functional and decreased pain.     Time 8   Period Weeks   Status New               Plan - 09/21/16 1738    Clinical Impression Statement Skilled session focused on postural exercises/stretches to decrease pain and improved flexibility.  Also formally assessed balance with DGI. Note score of 15/24 indicative of fall risk.  Will add balance to HEP on next visit.     Rehab Potential Good   Clinical Impairments Affecting Rehab Potential co-morbidities   PT Frequency 2x / week   PT Duration 8 weeks   PT Treatment/Interventions ADLs/Self Care Home Management;Electrical Stimulation;Traction;Ultrasound;DME Instruction;Gait training;Stair training;Functional mobility training;Therapeutic activities;Therapeutic exercise;Balance training;Neuromuscular re-education;Patient/family education;Manual techniques;Passive range of motion;Dry needling;Energy conservation;Vestibular;Other (comment)  thoracic grade V manipulations   PT Next Visit Plan Give balance HEP (told her to alternate  days for neck exercises and balance once she has them bc she has quite a few), Continue with gentle cervical/thoracic mobilization as able (unable to lie prone due to trach), postural exercises, HEP for postural strength and balance, BLE strength.     Consulted and Agree with Plan of Care Patient;Family member/caregiver   Family Member Consulted Husband [name]      Patient will benefit from skilled therapeutic intervention in order to improve the following deficits and impairments:  Abnormal gait, Decreased activity tolerance, Decreased balance, Decreased endurance, Decreased knowledge of use of DME, Decreased mobility, Decreased range of motion, Decreased safety awareness, Decreased strength, Hypomobility, Impaired perceived functional ability, Impaired flexibility, Impaired sensation, Improper body mechanics, Postural dysfunction, Pain  Visit Diagnosis: Muscle weakness (generalized)  Cervicalgia  Other abnormalities of gait and mobility  Unsteadiness on feet  Pain in thoracic spine     Problem List Patient Active Problem List   Diagnosis Date Noted  . Peripheral neuropathy 08/28/2016  . Diabetic peripheral neuropathy ([place]) 08/28/2016  . Leg weakness, bilateral 08/13/2016  . Gait abnormality 08/13/2016  . Carcinoma of overlapping sites of right breast in female, estrogen receptor negative ([place]) 12/27/2015  . Malignant neoplasm of  breast ([place]) 03/28/2015  . 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 Hypercholesterolemia 03/28/2015  . Recurrent major depressive disorder, in partial remission ([place]) 03/01/2015  . 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 Cephalalgia 09/15/2014  . Headache due to trauma 09/15/2014  . Osteoporosis of multiple sites 01/07/2014  . History of colonic polyps 12/14/2013  . History of breast cancer 11/26/2012  . H/O malignant neoplasm of breast 11/26/2012  . 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 Tracheostomy present ([place]) 12/06/2011  .  Calcification of bronchus or trachea 01/16/2011  . 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 Anxiety state 12/19/2010  . Blood in sputum 12/19/2010  . HLD (hyperlipidemia) 12/19/2010  . Type 2 diabetes mellitus ([place]) 12/19/2010    Cameron Sprang, PT, MPT [facility] [address] [place], [place], [address] Phone: [phone]   Fax:  [phone] 09/21/16, 5:42 PM  Name: Angela David MRN: [id number] Date of Birth: May 13, 1940

## 2016-09-21 NOTE — Patient Instructions (Addendum)
Thoracic Self-Mobilization (Supine)    With rolled towel placed lengthwise at lower ribs level, lie back on towel with arms outstretched. Hold _2-3 minutes. Relax.  Start with your arms in a "T" shape and work towards a "Y" as you get more flexible.   Repeat __1-2__ times per set. Do __1-2__ sets per session. Do _1-2___ sessions per day.  Just do this as needed when having increased pain especially.    http://orth.exer.us/1001   Copyright  VHI. All rights reserved.

## 2016-09-24 ENCOUNTER — Ambulatory Visit
Admission: RE | Admit: 2016-09-24 | Discharge: 2016-09-24 | Disposition: A | Discharge status: Home / Self Care | Payer: Medicare Other | Source: Ambulatory Visit | Attending: Orthopedic Surgery | Admitting: Orthopedic Surgery

## 2016-09-24 DIAGNOSIS — M4802 Spinal stenosis, cervical region: Secondary | ICD-10-CM | POA: Diagnosis not present

## 2016-09-24 DIAGNOSIS — M2578 Osteophyte, vertebrae: Secondary | ICD-10-CM | POA: Diagnosis not present

## 2016-09-24 DIAGNOSIS — M5412 Radiculopathy, cervical region: Secondary | ICD-10-CM | POA: Insufficient documentation

## 2016-09-24 DIAGNOSIS — G9529 Other cord compression: Secondary | ICD-10-CM | POA: Diagnosis not present

## 2016-09-27 ENCOUNTER — Ambulatory Visit: Payer: Medicare Other | Admitting: Physical Therapy

## 2016-09-28 DIAGNOSIS — M5412 Radiculopathy, cervical region: Secondary | ICD-10-CM | POA: Insufficient documentation

## 2016-10-01 ENCOUNTER — Ambulatory Visit: Payer: Medicare Other | Attending: Neurology | Admitting: Rehabilitation

## 2016-10-01 ENCOUNTER — Encounter: Payer: Self-pay | Admitting: Rehabilitation

## 2016-10-01 DIAGNOSIS — M546 Pain in thoracic spine: Secondary | ICD-10-CM

## 2016-10-01 DIAGNOSIS — M6281 Muscle weakness (generalized): Secondary | ICD-10-CM | POA: Diagnosis not present

## 2016-10-01 DIAGNOSIS — R2689 Other abnormalities of gait and mobility: Secondary | ICD-10-CM | POA: Insufficient documentation

## 2016-10-01 DIAGNOSIS — M542 Cervicalgia: Secondary | ICD-10-CM | POA: Diagnosis present

## 2016-10-01 DIAGNOSIS — R2681 Unsteadiness on feet: Secondary | ICD-10-CM

## 2016-10-01 DIAGNOSIS — R296 Repeated falls: Secondary | ICD-10-CM | POA: Insufficient documentation

## 2016-10-01 NOTE — Therapy (Signed)
[facility] [address] [place], [place], [address] Phone: [phone]   Fax:  [phone]  Physical Therapy Treatment  Patient Details  Name: Angela David MRN: [id number] Date of Birth: 1940-07-17 Referring Provider: Margette Fast, MD  Encounter Date: 10/01/2016      PT End of Session - 10/01/16 2102    Visit Number 4   Number of Visits 17   Date for PT Re-Evaluation 11/09/16   Authorization Type MCR G Code on every 10th visit   PT Start Time 1448   PT Stop Time 1530   PT Time Calculation (min) 42 min   Activity Tolerance Patient tolerated treatment well   Behavior During Therapy [facility] for tasks assessed/performed      Past Medical History:  Diagnosis Date  . Breast cancer ([place]) 2002   right breast chemo and radiation  . Cancer [facility]) 2002   right breast  . Diabetic peripheral neuropathy ([place]) 08/28/2016  . Glaucoma   . High cholesterol   . History of colon polyps   . Peripheral neuropathy 08/28/2016  . Personal history of chemotherapy 2002   BREAST CA  . Personal history of malignant neoplasm of breast   . Personal history of radiation therapy 2002   BREAST CA  . Sleep apnea 2011    Past Surgical History:  Procedure Laterality Date  . ABDOMINAL HYSTERECTOMY    . BREAST BIOPSY Right 2008   neg  . BREAST EXCISIONAL BIOPSY Right 2002   positive  . BREAST SURGERY Right 2002   LUMPECTOMY, radiation, chemotherapy  . COLONOSCOPY  2009,12/30/2013  . HYDROCELE EXCISION / REPAIR    . TRACHEAL SURGERY  2013  . TRACHEOSTOMY N/A     There were no vitals filed for this visit.      Subjective Assessment - 10/01/16 1510    Subjective Pt reports increased pain, won't have results of MRI until 13th.    Patient is accompained by: Family member   Limitations House hold activities;Walking;Standing   Patient Stated Goals "I want to walk normal"    Currently in Pain? Yes   Pain Score 8    Pain Location  Thoracic   Pain Orientation Upper   Pain Descriptors / Indicators Aching   Pain Type Chronic pain   Pain Onset More than a month ago   Pain Frequency Constant                         [facility] Adult PT Treatment/Exercise - 10/01/16 0001      Ambulation/Gait   Ambulation/Gait Yes   Ambulation/Gait Assistance 5: Supervision   Ambulation/Gait Assistance Details Attached foam roll to pts back while in upright position to work on postural control with gait and simulated ADLs.  Provided cues for head posture, but overall did very well adjusting for posture during functional mobility.       Neuro Re-ed    Neuro Re-ed Details  Initiated balance HEP with counter top exercises, see pt instruction for details on exercises and reps performed.       Manual Therapy   Manual Therapy Joint mobilization;Myofascial release;Scapular mobilization   Manual therapy comments Pt with increased pain in thoracic region today, therefore performed light MT to this region to reduce pain.     Joint Mobilization While in seated position with towel roll at upper thoracic spine, had pt extend backwards over chair to improve thoracic mobility and decrease pain.  While  in this position performed light grade I/II mobs as she is unable to get into prone position due to trach.  Tolerated well.     Myofascial Release Performed myofascial release at B rhomboids/paraspinal muscles in seated position.  Pt tolerated well.                  PT Education - 10/01/16 2102    Education provided Yes   Education Details additions to HEP   Person(s) Educated Patient;Spouse   Methods Explanation;Demonstration   Comprehension Verbalized understanding;Returned demonstration          PT Short Term Goals - 09/21/16 1740      PT SHORT TERM GOAL #1   Title Pt will be independent with initial HEP in order to indicate improved functional mobility and decreased fall risk.  (Target Date: 10/10/16)   Time 4   Period  Weeks   Status New     PT SHORT TERM GOAL #2   Title Will assess cervical ROM and set appropriate LTGs.    Time 4   Period Weeks   Status Achieved     PT SHORT TERM GOAL #3   Title Pt will report no more than 6/10 upper thoracic/cervical back pain in order to indicate improved function.     Time 4   Period Weeks   Status New     PT SHORT TERM GOAL #4   Title Will assess DGI and improve score by 2 points in order to indicate decreased fall risk.     Baseline 15/24 on 09/21/16   Time 4   Period Weeks   Status New     PT SHORT TERM GOAL #5   Title Pt will ambulate up to 200' over indoor surfaces w/ SPC at mod I level in order to indicate improved home negotiation.     Time 4   Period Weeks   Status New     PT SHORT TERM GOAL #6   Title Pt will improve TUG to </=15 secs w/ LRAD in order to indicate decreased fall risk.     Time 4   Period Weeks   Status New     PT SHORT TERM GOAL #7   Title Pt will improve gait speed to >/=2.62 ft/sec w/ LRAD in order to indicate decreased fall risk and improved efficiency of gait.     Time 4   Period Weeks   Status New           PT Long Term Goals - 09/17/16 1133      PT LONG TERM GOAL #1   Title Pt will be independent with final HEP in order to indicate improved function and decreased fall risk.  (Target Date: 11/09/16)   Time 8   Period Weeks   Status New     PT LONG TERM GOAL #2   Title Pt will improve DGI by 4 points from baseline in order to indicate decreased fall risk.     Time 8   Period Weeks   Status New     PT LONG TERM GOAL #3   Title Pt will report no more than 4/10 pain in upper thoracic/cervical region in order to indicate improved function.     Time 8   Period Weeks   Status New     PT LONG TERM GOAL #4   Title Pt will improve TUG </=13.5 secs w/ LRAD in order to indicate decreased fall risk.     Time  8   Period Weeks   Status New     PT LONG TERM GOAL #5   Title Pt will ambulate up to 500' over  unlevel paved outdoor surfaces w/ LRAD at mod I level in order to indicate safe community negotiation.    Time 8   Period Weeks   Status New     PT LONG TERM GOAL #6   Title Pt will improve ABC FOTO score to >/=50% in order to indicate self reported improved confidence in balance.     Time 8   Period Weeks   Status New     PT LONG TERM GOAL #7   Title Pt will improve cervical ROM (flex/ext/R/Lrotation) by 10 deg in order to indicate improved functional and decreased pain.     Time 8   Period Weeks   Status New               Plan - 10/01/16 2102    Clinical Impression Statement Skilled session focused on decreasing pts pain with thoracic mobility and manual therapy as well as postural awareness during ADL simulated tasks.  Also initiated balance HEP at counter top.   Pt with reduced pain following session.    Rehab Potential Good   Clinical Impairments Affecting Rehab Potential co-morbidities   PT Frequency 2x / week   PT Duration 8 weeks   PT Treatment/Interventions ADLs/Self Care Home Management;Electrical Stimulation;Traction;Ultrasound;DME Instruction;Gait training;Stair training;Functional mobility training;Therapeutic activities;Therapeutic exercise;Balance training;Neuromuscular re-education;Patient/family education;Manual techniques;Passive range of motion;Dry needling;Energy conservation;Vestibular;Other (comment)  thoracic grade V manipulations   PT Next Visit Plan Add to balance HEP (told her to alternate days for neck exercises and balance once she has them bc she has quite a few), Continue with gentle cervical/thoracic mobilization as able (unable to lie prone due to trach), postural exercises, HEP for postural strength and balance, BLE strength.     Consulted and Agree with Plan of Care Patient;Family member/caregiver   Family Member Consulted Husband [name]      Patient will benefit from skilled therapeutic intervention in order to improve the following deficits and  impairments:  Abnormal gait, Decreased activity tolerance, Decreased balance, Decreased endurance, Decreased knowledge of use of DME, Decreased mobility, Decreased range of motion, Decreased safety awareness, Decreased strength, Hypomobility, Impaired perceived functional ability, Impaired flexibility, Impaired sensation, Improper body mechanics, Postural dysfunction, Pain  Visit Diagnosis: Muscle weakness (generalized)  Other abnormalities of gait and mobility  Unsteadiness on feet  Pain in thoracic spine     Problem List Patient Active Problem List   Diagnosis Date Noted  . Peripheral neuropathy 08/28/2016  . Diabetic peripheral neuropathy ([place]) 08/28/2016  . Leg weakness, bilateral 08/13/2016  . Gait abnormality 08/13/2016  . Carcinoma of overlapping sites of right breast in female, estrogen receptor negative ([place]) 12/27/2015  . Malignant neoplasm of breast ([place]) 03/28/2015  . 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 Hypercholesterolemia 03/28/2015  . Recurrent major depressive disorder, in partial remission ([place]) 03/01/2015  . 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 Cephalalgia 09/15/2014  . Headache due to trauma 09/15/2014  . Osteoporosis of multiple sites 01/07/2014  . History of colonic polyps 12/14/2013  . History of breast cancer 11/26/2012  . H/O malignant neoplasm of breast 11/26/2012  . 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 Tracheostomy present ([place]) 12/06/2011  . Calcification of bronchus or trachea 01/16/2011  . 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 Anxiety state 12/19/2010  . Blood in sputum 12/19/2010  . HLD (  hyperlipidemia) 12/19/2010  . Type 2 diabetes mellitus ([place]) 12/19/2010    Cameron Sprang, PT, MPT [facility] [address] [place], [place], [address] Phone: [phone]   Fax:  [phone] 10/01/16, 9:07 PM  Name: Angela David MRN: [id number] Date of Birth:  March 07, 1940

## 2016-10-01 NOTE — Patient Instructions (Addendum)
Thoracic Self-Mobilization (Sitting)    With small rolled towel at upper ribs level, gently lean back until stretch is felt. Hold __30-60__ seconds. Relax. Repeat _3___ times per set. Do __1__ sets per session. Do _2_ sessions per day.  http://orth.exer.us/999   Copyright  VHI. All rights reserved.    Tandem Walking    Walk with each foot directly in front of other, heel of one foot touching toes of other foot with each step. Both feet straight ahead.  Do this forward and backwards along your counter top.  Repeat 3 laps down and back.     Copyright  VHI. All rights reserved.   "I love a Parade" Lift    Along a counter top, use one hand for support and march as high and slow as you can while moving forwards then repeat backwards.  Stay tall.  Repeat x 3 reps down and back.     http://gt2.exer.us/345   Copyright  VHI. All rights reserved.

## 2016-10-04 ENCOUNTER — Ambulatory Visit: Payer: Medicare Other | Admitting: Rehabilitation

## 2016-10-04 ENCOUNTER — Encounter: Payer: Self-pay | Admitting: Rehabilitation

## 2016-10-04 DIAGNOSIS — R2681 Unsteadiness on feet: Secondary | ICD-10-CM

## 2016-10-04 DIAGNOSIS — R296 Repeated falls: Secondary | ICD-10-CM

## 2016-10-04 DIAGNOSIS — M6281 Muscle weakness (generalized): Secondary | ICD-10-CM

## 2016-10-04 DIAGNOSIS — R2689 Other abnormalities of gait and mobility: Secondary | ICD-10-CM

## 2016-10-04 NOTE — Patient Instructions (Signed)
For these exercises:  Stand in a corner and place a chair in front of you for support if needed.     Feet Together, Head Motion - Eyes Open    With eyes open, feet together, move head slowly: up and down x 10 reps, and up and down x 10 reps  Repeat _1___ times per session. Do _1-2_ sessions per day.  Copyright  VHI. All rights reserved.    Feet Together, Varied Arm Positions - Eyes Closed    Stand with feet together and arms out. Close eyes and visualize upright position. Hold __20__ seconds. Repeat __3__ times per session. Do __1-2__ sessions per day.  Copyright  VHI. All rights reserved.    Feet Apart (Compliant Surface) Head Motion - Eyes Open    With eyes open, standing on compliant surface: __pillow_____, feet shoulder width apart, move head slowly: up and down x 10 reps, side to side x 10 reps.  Repeat _1___ times per session. Do __1-2__ sessions per day.  Copyright  VHI. All rights reserved.   Feet Apart (Compliant Surface) Varied Arm Positions - Eyes Closed    Stand on compliant surface: ___pillow_____ with feet shoulder width apart and arms by your side. Close eyes and visualize upright position. Hold_20___ seconds. Repeat _3___ times per session. Do __1-2__ sessions per day.  Copyright  VHI. All rights reserved.

## 2016-10-04 NOTE — Therapy (Signed)
[facility] [address] [place], [place], [address] Phone: [phone]   Fax:  [phone]  Physical Therapy Treatment  Patient Details  Name: Angela David MRN: [id number] Date of Birth: Jul 12, 1940 Referring Provider: Margette Fast, MD  Encounter Date: 10/04/2016      PT End of Session - 10/04/16 1920    Visit Number 5   Number of Visits 17   Date for PT Re-Evaluation 11/09/16   Authorization Type MCR G Code on every 10th visit   PT Start Time 1450   PT Stop Time 1535   PT Time Calculation (min) 45 min   Activity Tolerance Patient tolerated treatment well   Behavior During Therapy [facility] for tasks assessed/performed      Past Medical History:  Diagnosis Date  . Breast cancer ([place]) 2002   right breast chemo and radiation  . Cancer [facility]) 2002   right breast  . Diabetic peripheral neuropathy ([place]) 08/28/2016  . Glaucoma   . High cholesterol   . History of colon polyps   . Peripheral neuropathy 08/28/2016  . Personal history of chemotherapy 2002   BREAST CA  . Personal history of malignant neoplasm of breast   . Personal history of radiation therapy 2002   BREAST CA  . Sleep apnea 2011    Past Surgical History:  Procedure Laterality Date  . ABDOMINAL HYSTERECTOMY    . BREAST BIOPSY Right 2008   neg  . BREAST EXCISIONAL BIOPSY Right 2002   positive  . BREAST SURGERY Right 2002   LUMPECTOMY, radiation, chemotherapy  . COLONOSCOPY  2009,12/30/2013  . HYDROCELE EXCISION / REPAIR    . TRACHEAL SURGERY  2013  . TRACHEOSTOMY N/A     There were no vitals filed for this visit.      Subjective Assessment - 10/04/16 1456    Subjective Pt reports continued pain in upper back.     Patient is accompained by: Family member   Patient Stated Goals "I want to walk normal"    Currently in Pain? Yes   Pain Score 7    Pain Location Thoracic   Pain Orientation Upper   Pain Descriptors / Indicators Aching   Pain Type Chronic pain   Pain Onset More than a month ago   Pain Frequency Constant   Aggravating Factors  standing, doing too much   Pain Relieving Factors resting                          [facility] Adult PT Treatment/Exercise - 10/04/16 0001      Ambulation/Gait   Ambulation/Gait Yes   Ambulation/Gait Assistance 5: Supervision   Ambulation/Gait Assistance Details Worked on gait during session for improved quality.  Emphasis on improved stride length, improved heel to toe contact, decreased BOS, upright posture and increased arm swing.  Note pt wears open toe and heeled shoes to sessions and she tends to land very flat footed which increases fall risk.  Despite [place] working to increase heel to toe contact, feel that she isn't fully confident in hitting heel down first due to fear that shoe will come off.  Encouraged pt to wear shoe with closed heel/strap around heel to improve confidence with gait. Pt verbalized understanding.     Ambulation Distance (Feet) 300 Feet   Assistive device None   Gait Pattern Step-through pattern;Decreased stride length;Right foot flat;Left foot flat;Trunk flexed;Narrow base of support   Ambulation Surface Level;Indoor  Self-Care   Self-Care Other Self-Care Comments   Other Self-Care Comments  Discussed possible benefits of dry needling to rhomboid/middle trap region to decrease muscle tension and reduce pain.  Pt open to idea therefore PT to work on getting her to [facility] st location for dry needling.  Will add to POC.  See gait section for details on shoe wear discussed for improved/safer gait.       Neuro Re-ed    Neuro Re-ed Details  Continued to work on balance with corner balance tasks.  See pt instruction for details on exercises performed and reps.                  PT Education - 10/04/16 1920    Education provided Yes   Education Details balance additions to HEP, dry needling   Person(s) Educated Patient;Spouse   Methods  Explanation;Demonstration   Comprehension Verbalized understanding;Returned demonstration          PT Short Term Goals - 09/21/16 1740      PT SHORT TERM GOAL #1   Title Pt will be independent with initial HEP in order to indicate improved functional mobility and decreased fall risk.  (Target Date: 10/10/16)   Time 4   Period Weeks   Status New     PT SHORT TERM GOAL #2   Title Will assess cervical ROM and set appropriate LTGs.    Time 4   Period Weeks   Status Achieved     PT SHORT TERM GOAL #3   Title Pt will report no more than 6/10 upper thoracic/cervical back pain in order to indicate improved function.     Time 4   Period Weeks   Status New     PT SHORT TERM GOAL #4   Title Will assess DGI and improve score by 2 points in order to indicate decreased fall risk.     Baseline 15/24 on 09/21/16   Time 4   Period Weeks   Status New     PT SHORT TERM GOAL #5   Title Pt will ambulate up to 200' over indoor surfaces w/ SPC at mod I level in order to indicate improved home negotiation.     Time 4   Period Weeks   Status New     PT SHORT TERM GOAL #6   Title Pt will improve TUG to </=15 secs w/ LRAD in order to indicate decreased fall risk.     Time 4   Period Weeks   Status New     PT SHORT TERM GOAL #7   Title Pt will improve gait speed to >/=2.62 ft/sec w/ LRAD in order to indicate decreased fall risk and improved efficiency of gait.     Time 4   Period Weeks   Status New           PT Long Term Goals - 09/17/16 1133      PT LONG TERM GOAL #1   Title Pt will be independent with final HEP in order to indicate improved function and decreased fall risk.  (Target Date: 11/09/16)   Time 8   Period Weeks   Status New     PT LONG TERM GOAL #2   Title Pt will improve DGI by 4 points from baseline in order to indicate decreased fall risk.     Time 8   Period Weeks   Status New     PT LONG TERM GOAL #3   Title Pt will  report no more than 4/10 pain in upper  thoracic/cervical region in order to indicate improved function.     Time 8   Period Weeks   Status New     PT LONG TERM GOAL #4   Title Pt will improve TUG </=13.5 secs w/ LRAD in order to indicate decreased fall risk.     Time 8   Period Weeks   Status New     PT LONG TERM GOAL #5   Title Pt will ambulate up to 500' over unlevel paved outdoor surfaces w/ LRAD at mod I level in order to indicate safe community negotiation.    Time 8   Period Weeks   Status New     PT LONG TERM GOAL #6   Title Pt will improve ABC FOTO score to >/=50% in order to indicate self reported improved confidence in balance.     Time 8   Period Weeks   Status New     PT LONG TERM GOAL #7   Title Pt will improve cervical ROM (flex/ext/R/Lrotation) by 10 deg in order to indicate improved functional and decreased pain.     Time 8   Period Weeks   Status New               Plan - 10/04/16 1921    Clinical Impression Statement Session focused on continuing to add to HEP for high level balance, esp those that challenge vestibular system. Also worked on gait for improved quality and discussed possible benefit of dry needling to mid thoracic musculature to reduce pain.  Pt verbalized understanding.     Rehab Potential Good   Clinical Impairments Affecting Rehab Potential co-morbidities   PT Frequency 2x / week   PT Duration 8 weeks   PT Treatment/Interventions ADLs/Self Care Home Management;Electrical Stimulation;Traction;Ultrasound;DME Instruction;Gait training;Stair training;Functional mobility training;Therapeutic activities;Therapeutic exercise;Balance training;Neuromuscular re-education;Patient/family education;Manual techniques;Passive range of motion;Dry needling;Energy conservation;Vestibular;Other (comment)  thoracic grade V manipulations   PT Next Visit Plan Gait for improved quality, Continue with gentle cervical/thoracic mobilization as able (unable to lie prone due to trach), postural  exercises, HEP for postural strength and balance, BLE strength.     Consulted and Agree with Plan of Care Patient;Family member/caregiver   Family Member Consulted Husband [name]      Patient will benefit from skilled therapeutic intervention in order to improve the following deficits and impairments:  Abnormal gait, Decreased activity tolerance, Decreased balance, Decreased endurance, Decreased knowledge of use of DME, Decreased mobility, Decreased range of motion, Decreased safety awareness, Decreased strength, Hypomobility, Impaired perceived functional ability, Impaired flexibility, Impaired sensation, Improper body mechanics, Postural dysfunction, Pain  Visit Diagnosis: Muscle weakness (generalized)  Other abnormalities of gait and mobility  Unsteadiness on feet  Repeated falls     Problem List Patient Active Problem List   Diagnosis Date Noted  . Peripheral neuropathy 08/28/2016  . Diabetic peripheral neuropathy ([place]) 08/28/2016  . Leg weakness, bilateral 08/13/2016  . Gait abnormality 08/13/2016  . Carcinoma of overlapping sites of right breast in female, estrogen receptor negative ([place]) 12/27/2015  . Malignant neoplasm of breast ([place]) 03/28/2015  . 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 Hypercholesterolemia 03/28/2015  . Recurrent major depressive disorder, in partial remission ([place]) 03/01/2015  . 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 Cephalalgia 09/15/2014  . Headache due to trauma 09/15/2014  . Osteoporosis of multiple sites 01/07/2014  . History of colonic polyps 12/14/2013  . History of breast cancer 11/26/2012  .  H/O malignant neoplasm of breast 11/26/2012  . 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 Tracheostomy present ([place]) 12/06/2011  . Calcification of bronchus or trachea 01/16/2011  . 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 Anxiety state 12/19/2010  . Blood in sputum 12/19/2010  . HLD (hyperlipidemia) 12/19/2010  . Type 2 diabetes  mellitus ([place]) 12/19/2010    Cameron Sprang, PT, MPT [facility] [address] [place], [place], [address] Phone: [phone]   Fax:  [phone] 10/04/16, 7:26 PM  Name: Ryanna Teschner MRN: [id number] Date of Birth: 24-Apr-1940

## 2016-10-08 ENCOUNTER — Encounter: Payer: Self-pay | Admitting: Rehabilitation

## 2016-10-08 ENCOUNTER — Ambulatory Visit: Payer: Medicare Other | Admitting: Rehabilitation

## 2016-10-08 DIAGNOSIS — M542 Cervicalgia: Secondary | ICD-10-CM

## 2016-10-08 DIAGNOSIS — M546 Pain in thoracic spine: Secondary | ICD-10-CM

## 2016-10-08 DIAGNOSIS — R296 Repeated falls: Secondary | ICD-10-CM

## 2016-10-08 DIAGNOSIS — R2681 Unsteadiness on feet: Secondary | ICD-10-CM

## 2016-10-08 DIAGNOSIS — M6281 Muscle weakness (generalized): Secondary | ICD-10-CM

## 2016-10-08 DIAGNOSIS — R2689 Other abnormalities of gait and mobility: Secondary | ICD-10-CM

## 2016-10-09 NOTE — Therapy (Signed)
[facility] [address] [place], [place], [address] Phone: [phone]   Fax:  [phone]  Physical Therapy Treatment  Patient Details  Name: Angela David MRN: [id number] Date of Birth: 13-Aug-1940 Referring Provider: Margette Fast, MD  Encounter Date: 10/08/2016      PT End of Session - 10/09/16 0711    Visit Number 6   Number of Visits 17   Date for PT Re-Evaluation 11/09/16   Authorization Type MCR G Code on every 10th visit   PT Start Time 1448   PT Stop Time 1530   PT Time Calculation (min) 42 min   Activity Tolerance Patient tolerated treatment well   Behavior During Therapy [facility] for tasks assessed/performed      Past Medical History:  Diagnosis Date  . Breast cancer ([place]) 2002   right breast chemo and radiation  . Cancer [facility]) 2002   right breast  . Diabetic peripheral neuropathy ([place]) 08/28/2016  . Glaucoma   . High cholesterol   . History of colon polyps   . Peripheral neuropathy 08/28/2016  . Personal history of chemotherapy 2002   BREAST CA  . Personal history of malignant neoplasm of breast   . Personal history of radiation therapy 2002   BREAST CA  . Sleep apnea 2011    Past Surgical History:  Procedure Laterality Date  . ABDOMINAL HYSTERECTOMY    . BREAST BIOPSY Right 2008   neg  . BREAST EXCISIONAL BIOPSY Right 2002   positive  . BREAST SURGERY Right 2002   LUMPECTOMY, radiation, chemotherapy  . COLONOSCOPY  2009,12/30/2013  . HYDROCELE EXCISION / REPAIR    . TRACHEAL SURGERY  2013  . TRACHEOSTOMY N/A     There were no vitals filed for this visit.      Subjective Assessment - 10/08/16 1454    Subjective Pt reports she has to see an orthopedic surgeon next week to either get injection or plan for surgery.     Patient is accompained by: Family member   Limitations House hold activities;Walking;Standing   Patient Stated Goals "I want to walk normal"    Currently in Pain?  Yes   Pain Score 7    Pain Location Thoracic   Pain Orientation Upper   Pain Descriptors / Indicators Aching   Pain Type Chronic pain   Pain Onset More than a month ago   Pain Frequency Constant   Aggravating Factors  Standing, doing too much    Pain Relieving Factors resting                          [facility] Adult PT Treatment/Exercise - 10/09/16 0001      Ambulation/Gait   Ambulation/Gait Yes   Ambulation/Gait Assistance 5: Supervision   Ambulation/Gait Assistance Details Continued to work on gait for improved quality.  Note pt wearing proper shoe wear today, therefore provided demo and cues for increased stride length, increased heel to toe contact, upright posture and improved arm swing.  Pt does well initially with tasks, however when cued for another deficit, tends to lose correction of another deficit.  Provided feedback on gait with mirror and use of walking sticks to improve arm swing with gait.  Will continue to address this during future sessions.     Ambulation Distance (Feet) 400 Feet   Assistive device None   Gait Pattern Step-through pattern;Decreased stride length;Right foot flat;Left foot flat;Trunk flexed;Narrow base of support  Ambulation Surface Level;Indoor     High Level Balance   High Level Balance Comments Performed heel walking along counter top x 3 reps down and back for improved anterior leg strength, balance and to carryover to gait. cues for posture     Self-Care   Self-Care Other Self-Care Comments   Other Self-Care Comments  Discussed holding off on dry needling until she gets more information from surgeon on whether she is having surgery or just injection to help with pain.  Pt verbalized understanding.       Manual Therapy   Manual Therapy Joint mobilization;Myofascial release;Manual Traction   Joint Mobilization Performed supine lateral glide mobilizations (grade II/III) to upper and mid cervical region.  Pt tolerated well with decrease  in pain to 6/10.  Also attempted to perform upper thoracic manipulation during sesssion, however once pt in position, she had increased pain from therapist hand placement, therefore provided very light gentle mobilization in this position x 2 reps.     Myofascial Release Performed light myofascial release to mid trap/paraspinal musculature in seated position.      Manual Traction Manual traction x 2 mins to decrease pain.  Pt tolerated well.                  PT Education - 10/09/16 0711    Education provided Yes   Education Details see self care   Person(s) Educated Patient   Methods Explanation   Comprehension Verbalized understanding          PT Short Term Goals - 09/21/16 1740      PT SHORT TERM GOAL #1   Title Pt will be independent with initial HEP in order to indicate improved functional mobility and decreased fall risk.  (Target Date: 10/10/16)   Time 4   Period Weeks   Status New     PT SHORT TERM GOAL #2   Title Will assess cervical ROM and set appropriate LTGs.    Time 4   Period Weeks   Status Achieved     PT SHORT TERM GOAL #3   Title Pt will report no more than 6/10 upper thoracic/cervical back pain in order to indicate improved function.     Time 4   Period Weeks   Status New     PT SHORT TERM GOAL #4   Title Will assess DGI and improve score by 2 points in order to indicate decreased fall risk.     Baseline 15/24 on 09/21/16   Time 4   Period Weeks   Status New     PT SHORT TERM GOAL #5   Title Pt will ambulate up to 200' over indoor surfaces w/ SPC at mod I level in order to indicate improved home negotiation.     Time 4   Period Weeks   Status New     PT SHORT TERM GOAL #6   Title Pt will improve TUG to </=15 secs w/ LRAD in order to indicate decreased fall risk.     Time 4   Period Weeks   Status New     PT SHORT TERM GOAL #7   Title Pt will improve gait speed to >/=2.62 ft/sec w/ LRAD in order to indicate decreased fall risk and  improved efficiency of gait.     Time 4   Period Weeks   Status New           PT Long Term Goals - 09/17/16 1133  PT LONG TERM GOAL #1   Title Pt will be independent with final HEP in order to indicate improved function and decreased fall risk.  (Target Date: 11/09/16)   Time 8   Period Weeks   Status New     PT LONG TERM GOAL #2   Title Pt will improve DGI by 4 points from baseline in order to indicate decreased fall risk.     Time 8   Period Weeks   Status New     PT LONG TERM GOAL #3   Title Pt will report no more than 4/10 pain in upper thoracic/cervical region in order to indicate improved function.     Time 8   Period Weeks   Status New     PT LONG TERM GOAL #4   Title Pt will improve TUG </=13.5 secs w/ LRAD in order to indicate decreased fall risk.     Time 8   Period Weeks   Status New     PT LONG TERM GOAL #5   Title Pt will ambulate up to 500' over unlevel paved outdoor surfaces w/ LRAD at mod I level in order to indicate safe community negotiation.    Time 8   Period Weeks   Status New     PT LONG TERM GOAL #6   Title Pt will improve ABC FOTO score to >/=50% in order to indicate self reported improved confidence in balance.     Time 8   Period Weeks   Status New     PT LONG TERM GOAL #7   Title Pt will improve cervical ROM (flex/ext/R/Lrotation) by 10 deg in order to indicate improved functional and decreased pain.     Time 8   Period Weeks   Status New               Plan - 10/09/16 0711    Clinical Impression Statement Skilled session focused on addressing mid back and neck pain with gentle mobilization and soft tissue mobilization along with continuing to address safety with gait to improve quality.  Pt tolerated well.     Rehab Potential Good   Clinical Impairments Affecting Rehab Potential co-morbidities   PT Frequency 2x / week   PT Duration 8 weeks   PT Treatment/Interventions ADLs/Self Care Home Management;Electrical  Stimulation;Traction;Ultrasound;DME Instruction;Gait training;Stair training;Functional mobility training;Therapeutic activities;Therapeutic exercise;Balance training;Neuromuscular re-education;Patient/family education;Manual techniques;Passive range of motion;Dry needling;Energy conservation;Vestibular;Other (comment)  thoracic grade V manipulations   PT Next Visit Plan Begin to check STGs. Gait for improved quality, Continue with gentle cervical/thoracic mobilization as able (unable to lie prone due to trach), postural exercises, HEP for postural strength and balance, BLE strength.     Consulted and Agree with Plan of Care Patient;Family member/caregiver   Family Member Consulted Husband [name]      Patient will benefit from skilled therapeutic intervention in order to improve the following deficits and impairments:  Abnormal gait, Decreased activity tolerance, Decreased balance, Decreased endurance, Decreased knowledge of use of DME, Decreased mobility, Decreased range of motion, Decreased safety awareness, Decreased strength, Hypomobility, Impaired perceived functional ability, Impaired flexibility, Impaired sensation, Improper body mechanics, Postural dysfunction, Pain  Visit Diagnosis: Muscle weakness (generalized)  Other abnormalities of gait and mobility  Unsteadiness on feet  Repeated falls  Pain in thoracic spine  Cervicalgia     Problem List Patient Active Problem List   Diagnosis Date Noted  . Peripheral neuropathy 08/28/2016  . Diabetic peripheral neuropathy ([place]) 08/28/2016  . Leg weakness,  bilateral 08/13/2016  . Gait abnormality 08/13/2016  . Carcinoma of overlapping sites of right breast in female, estrogen receptor negative ([place]) 12/27/2015  . Malignant neoplasm of breast ([place]) 03/28/2015  . 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 Hypercholesterolemia 03/28/2015  . Recurrent major depressive disorder, in partial remission ([place]) 03/01/2015   . 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 Cephalalgia 09/15/2014  . Headache due to trauma 09/15/2014  . Osteoporosis of multiple sites 01/07/2014  . History of colonic polyps 12/14/2013  . History of breast cancer 11/26/2012  . H/O malignant neoplasm of breast 11/26/2012  . 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 Tracheostomy present ([place]) 12/06/2011  . Calcification of bronchus or trachea 01/16/2011  . 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 Anxiety state 12/19/2010  . Blood in sputum 12/19/2010  . HLD (hyperlipidemia) 12/19/2010  . Type 2 diabetes mellitus ([place]) 12/19/2010    Angela David, PT, MPT [facility] [address] [name], [place], [address] Phone: [phone]   Fax:  [phone] 10/09/16, 7:16 AM  Name: Angela David MRN: [id number] Date of Birth: Aug 14, 1940

## 2016-10-12 ENCOUNTER — Encounter: Payer: Self-pay | Admitting: Rehabilitation

## 2016-10-12 ENCOUNTER — Ambulatory Visit: Payer: Medicare Other | Admitting: Rehabilitation

## 2016-10-12 DIAGNOSIS — M6281 Muscle weakness (generalized): Secondary | ICD-10-CM

## 2016-10-12 DIAGNOSIS — R2681 Unsteadiness on feet: Secondary | ICD-10-CM

## 2016-10-12 DIAGNOSIS — R296 Repeated falls: Secondary | ICD-10-CM

## 2016-10-12 DIAGNOSIS — R2689 Other abnormalities of gait and mobility: Secondary | ICD-10-CM

## 2016-10-12 DIAGNOSIS — M542 Cervicalgia: Secondary | ICD-10-CM

## 2016-10-13 NOTE — Therapy (Signed)
[facility] [address] [place], [place], [address] Phone: [phone]   Fax:  [phone]  Physical Therapy Treatment  Patient Details  Name: Angela David MRN: [id number] Date of Birth: 05/29/1940 Referring Provider: Margette Fast, MD  Encounter Date: 10/12/2016      PT End of Session - 10/12/16 1458    Visit Number 7   Number of Visits 17   Date for PT Re-Evaluation 11/09/16   Authorization Type MCR G Code on every 10th visit   PT Start Time 1447   PT Stop Time 1531   PT Time Calculation (min) 44 min   Activity Tolerance Patient tolerated treatment well   Behavior During Therapy [facility] for tasks assessed/performed      Past Medical History:  Diagnosis Date  . Breast cancer ([place]) 2002   right breast chemo and radiation  . Cancer [facility]) 2002   right breast  . Diabetic peripheral neuropathy ([place]) 08/28/2016  . Glaucoma   . High cholesterol   . History of colon polyps   . Peripheral neuropathy 08/28/2016  . Personal history of chemotherapy 2002   BREAST CA  . Personal history of malignant neoplasm of breast   . Personal history of radiation therapy 2002   BREAST CA  . Sleep apnea 2011    Past Surgical History:  Procedure Laterality Date  . ABDOMINAL HYSTERECTOMY    . BREAST BIOPSY Right 2008   neg  . BREAST EXCISIONAL BIOPSY Right 2002   positive  . BREAST SURGERY Right 2002   LUMPECTOMY, radiation, chemotherapy  . COLONOSCOPY  2009,12/30/2013  . HYDROCELE EXCISION / REPAIR    . TRACHEAL SURGERY  2013  . TRACHEOSTOMY N/A     There were no vitals filed for this visit.      Subjective Assessment - 10/12/16 1456    Subjective Reports having continued upper back/thoracic pain today.    Patient is accompained by: Family member   Limitations House hold activities;Walking;Standing   Patient Stated Goals "I want to walk normal"    Currently in Pain? Yes   Pain Score 7    Pain Location Thoracic    Pain Orientation Upper   Pain Descriptors / Indicators Aching   Pain Type Chronic pain   Pain Onset More than a month ago   Pain Frequency Constant   Aggravating Factors  standing, doing too much   Pain Relieving Factors resting       Briefly reassessed cervical ROM during session to check progress:   Flex: 57 deg Ext: 28 deg R rotation: 37 deg L rotation: 50 deg  Note improvements in flex (marked improvement) and B rotation, however had a 10 deg decrease in extension.                    [facility] Adult PT Treatment/Exercise - 10/12/16 1500      Ambulation/Gait   Ambulation/Gait Yes   Ambulation/Gait Assistance 6: Modified independent (Device/Increase time)   Ambulation/Gait Assistance Details Pt able to ambulate safely at mod I level over indoor surfaces, however she benefits from cues for posture, arm swing and stride length to improve effieciency of gait.     Ambulation Distance (Feet) 400 Feet   Assistive device None   Gait Pattern Step-through pattern;Decreased stride length;Right foot flat;Left foot flat;Trunk flexed;Narrow base of support   Ambulation Surface Level;Indoor   Gait velocity 3.2 ft/sec without AD     Standardized Balance Assessment   Standardized  Balance Assessment Dynamic Gait Index;Timed Up and Go Test     Dynamic Gait Index   Level Surface Normal   Change in Gait Speed Mild Impairment   Gait with Horizontal Head Turns Mild Impairment   Gait with Vertical Head Turns Normal   Gait and Pivot Turn Normal   Step Over Obstacle Mild Impairment   Step Around Obstacles Normal   Steps Mild Impairment   Total Score 20     Timed Up and Go Test   TUG Normal TUG   Normal TUG (seconds) 12.03                PT Education - 10/12/16 1458    Education provided Yes   Education Details findings of STG assessment.    Person(s) Educated Patient   Methods Explanation   Comprehension Verbalized understanding          PT Short Term Goals  - 10/12/16 1458      PT SHORT TERM GOAL #1   Title Pt will be independent with initial HEP in order to indicate improved functional mobility and decreased fall risk.  (Target Date: 10/10/16)   Time 4   Period Weeks   Status New     PT SHORT TERM GOAL #2   Title Will assess cervical ROM and set appropriate LTGs.    Time 4   Period Weeks   Status Achieved     PT SHORT TERM GOAL #3   Title Pt will report no more than 6/10 upper thoracic/cervical back pain in order to indicate improved function.     Baseline has been consistently 7/10 for several visits   Time 4   Period Weeks   Status Partially Met     PT SHORT TERM GOAL #4   Title Will assess DGI and improve score by 2 points in order to indicate decreased fall risk.     Baseline 20/24 on 10/12/16   Time 4   Period Weeks   Status Achieved     PT SHORT TERM GOAL #5   Title Pt will ambulate up to 200' over indoor surfaces w/ SPC at mod I level in order to indicate improved home negotiation.     Baseline met 10/12/16   Time 4   Period Weeks   Status Achieved     PT SHORT TERM GOAL #6   Title Pt will improve TUG to </=15 secs w/ LRAD in order to indicate decreased fall risk.     Baseline 12.03 secs on 10/12/16   Time 4   Period Weeks   Status Achieved     PT SHORT TERM GOAL #7   Title Pt will improve gait speed to >/=2.62 ft/sec w/ LRAD in order to indicate decreased fall risk and improved efficiency of gait.     Baseline 3.2 ft/sec on 10/12/16   Time 4   Period Weeks   Status Achieved           PT Long Term Goals - 09/17/16 1133      PT LONG TERM GOAL #1   Title Pt will be independent with final HEP in order to indicate improved function and decreased fall risk.  (Target Date: 11/09/16)   Time 8   Period Weeks   Status New     PT LONG TERM GOAL #2   Title Pt will improve DGI by 4 points from baseline in order to indicate decreased fall risk.     Time 8  Period Weeks   Status New     PT LONG TERM GOAL #3    Title Pt will report no more than 4/10 pain in upper thoracic/cervical region in order to indicate improved function.     Time 8   Period Weeks   Status New     PT LONG TERM GOAL #4   Title Pt will improve TUG </=13.5 secs w/ LRAD in order to indicate decreased fall risk.     Time 8   Period Weeks   Status New     PT LONG TERM GOAL #5   Title Pt will ambulate up to 500' over unlevel paved outdoor surfaces w/ LRAD at mod I level in order to indicate safe community negotiation.    Time 8   Period Weeks   Status New     PT LONG TERM GOAL #6   Title Pt will improve ABC FOTO score to >/=50% in order to indicate self reported improved confidence in balance.     Time 8   Period Weeks   Status New     PT LONG TERM GOAL #7   Title Pt will improve cervical ROM (flex/ext/R/Lrotation) by 10 deg in order to indicate improved functional and decreased pain.     Time 8   Period Weeks   Status New               Plan - 10/13/16 0754    Clinical Impression Statement Skilled session focused on addressing STGs.  Pt has met 5/7 STGs, partially meeting 6th goal for pain.  Note that she has MD visit coming up next week to decide whether injection or surgery is neccessary.  Will continue to follow up with pt regarding this, otherwise is making excellent progress.      Rehab Potential Good   Clinical Impairments Affecting Rehab Potential co-morbidities   PT Frequency 2x / week   PT Duration 8 weeks   PT Treatment/Interventions ADLs/Self Care Home Management;Electrical Stimulation;Traction;Ultrasound;DME Instruction;Gait training;Stair training;Functional mobility training;Therapeutic activities;Therapeutic exercise;Balance training;Neuromuscular re-education;Patient/family education;Manual techniques;Passive range of motion;Dry needling;Energy conservation;Vestibular;Other (comment)  thoracic grade V manipulations   PT Next Visit Plan Assess HEP as able.  Gait for improved quality, Continue with  gentle cervical/thoracic mobilization as able (unable to lie prone due to trach), postural exercises, HEP for postural strength and balance, BLE strength.     Consulted and Agree with Plan of Care Patient;Family member/caregiver   Family Member Consulted Husband [name]      Patient will benefit from skilled therapeutic intervention in order to improve the following deficits and impairments:  Abnormal gait, Decreased activity tolerance, Decreased balance, Decreased endurance, Decreased knowledge of use of DME, Decreased mobility, Decreased range of motion, Decreased safety awareness, Decreased strength, Hypomobility, Impaired perceived functional ability, Impaired flexibility, Impaired sensation, Improper body mechanics, Postural dysfunction, Pain  Visit Diagnosis: Muscle weakness (generalized)  Other abnormalities of gait and mobility  Unsteadiness on feet  Repeated falls  Cervicalgia     Problem List Patient Active Problem List   Diagnosis Date Noted  . Peripheral neuropathy 08/28/2016  . Diabetic peripheral neuropathy ([place]) 08/28/2016  . Leg weakness, bilateral 08/13/2016  . Gait abnormality 08/13/2016  . Carcinoma of overlapping sites of right breast in female, estrogen receptor negative ([place]) 12/27/2015  . Malignant neoplasm of breast ([place]) 03/28/2015  . 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 Hypercholesterolemia 03/28/2015  . Recurrent major depressive disorder, in partial remission ([place]) 03/01/2015  . Chronic  post-traumatic headache 10/22/2014  . Cephalalgia 09/15/2014  . Headache due to trauma 09/15/2014  . Osteoporosis of multiple sites 01/07/2014  . History of colonic polyps 12/14/2013  . History of breast cancer 11/26/2012  . H/O malignant neoplasm of breast 11/26/2012  . 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 Tracheostomy present ([place]) 12/06/2011  . Calcification of bronchus or trachea 01/16/2011  . 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  Anxiety state 12/19/2010  . Blood in sputum 12/19/2010  . HLD (hyperlipidemia) 12/19/2010  . Type 2 diabetes mellitus ([place]) 12/19/2010    Cameron Sprang, PT, MPT  [address] [place], [place], [address] Phone: [phone]   Fax:  [phone] 10/13/16, 8:05 AM  Name: Angela David MRN: [id number] Date of Birth: December 29, 1940

## 2016-10-15 ENCOUNTER — Ambulatory Visit: Payer: Medicare Other | Admitting: Rehabilitation

## 2016-10-15 ENCOUNTER — Encounter: Payer: Self-pay | Admitting: Rehabilitation

## 2016-10-15 DIAGNOSIS — R2689 Other abnormalities of gait and mobility: Secondary | ICD-10-CM

## 2016-10-15 DIAGNOSIS — M6281 Muscle weakness (generalized): Secondary | ICD-10-CM

## 2016-10-15 DIAGNOSIS — R296 Repeated falls: Secondary | ICD-10-CM

## 2016-10-15 DIAGNOSIS — R2681 Unsteadiness on feet: Secondary | ICD-10-CM

## 2016-10-15 NOTE — Therapy (Signed)
[facility] [address] [place], [place], [address] Phone: [phone]   Fax:  [phone]  Physical Therapy Treatment  Patient Details  Name: Angela David MRN: [id number] Date of Birth: June 02, 1940 Referring Provider: Margette Fast, MD  Encounter Date: 10/15/2016      PT End of Session - 10/15/16 1455    Visit Number 8   Number of Visits 17   Date for PT Re-Evaluation 11/09/16   Authorization Type MCR G Code on every 10th visit   PT Start Time 1447   PT Stop Time 1531   PT Time Calculation (min) 44 min   Activity Tolerance Patient tolerated treatment well   Behavior During Therapy [facility] for tasks assessed/performed      Past Medical History:  Diagnosis Date  . Breast cancer ([place]) 2002   right breast chemo and radiation  . Cancer [facility]) 2002   right breast  . Diabetic peripheral neuropathy ([place]) 08/28/2016  . Glaucoma   . High cholesterol   . History of colon polyps   . Peripheral neuropathy 08/28/2016  . Personal history of chemotherapy 2002   BREAST CA  . Personal history of malignant neoplasm of breast   . Personal history of radiation therapy 2002   BREAST CA  . Sleep apnea 2011    Past Surgical History:  Procedure Laterality Date  . ABDOMINAL HYSTERECTOMY    . BREAST BIOPSY Right 2008   neg  . BREAST EXCISIONAL BIOPSY Right 2002   positive  . BREAST SURGERY Right 2002   LUMPECTOMY, radiation, chemotherapy  . COLONOSCOPY  2009,12/30/2013  . HYDROCELE EXCISION / REPAIR    . TRACHEAL SURGERY  2013  . TRACHEOSTOMY N/A     There were no vitals filed for this visit.      Subjective Assessment - 10/15/16 1454    Subjective Reports having a good weekend.     Patient is accompained by: Family member   Limitations House hold activities;Walking;Standing   Patient Stated Goals "I want to walk normal"    Currently in Pain? Yes   Pain Score 6    Pain Location Thoracic   Pain Orientation  Upper   Pain Descriptors / Indicators Aching   Pain Type Chronic pain   Pain Onset More than a month ago   Pain Frequency Constant   Aggravating Factors  standing, doing too much   Pain Relieving Factors resting                          [facility] Adult PT Treatment/Exercise - 10/15/16 0001      Ambulation/Gait   Ambulation/Gait Yes   Ambulation/Gait Assistance 5: Supervision   Ambulation/Gait Assistance Details Assessed gait over outdoor surfaces both paved and grass.  Note pt continues to require cues for improved stride length and posture, but overall did very well.  Did note that it took a lot of energy for her to complete short distance of gait.  Discussed walking program with her and spouse, see self care.     Ambulation Distance (Feet) 500 Feet   Assistive device None   Gait Pattern Step-through pattern;Decreased stride length;Right foot flat;Left foot flat;Trunk flexed;Narrow base of support   Ambulation Surface Level;Unlevel;Outdoor;Indoor;Other (comment)  mulch      Self-Care   Self-Care Other Self-Care Comments   Other Self-Care Comments  Discussed walking program with pt to perform at home to improve her endurance.  She is walking  outdoors but not consecutively to work on endurance.  Encouraged her to ambulate in driveway (gravel but is packed so safe) vs going to track or shopping mall to work on this.  Recommended she begin with 5-10 min increments and work up from there.  Pt and spouse verbalized understanding.       Neuro Re-ed    Neuro Re-ed Details  Cone tapping while standing on red therapy mat to emphasize SLS.  Performed x 10 reps with lateral stepping to next cone.  Progressed to tipping cone over and back upright in order to increase time spent in SLS.  Min/guard to min A at times but improved during session.  Marching forwards/backwards while on therapy mats x 15' each direction with cues for posture and slower controlled movemnet.  Min A to ensure pt  spending time on each leg.  Note decreased balance when in stance on LLE.                  PT Education - 10/15/16 2005    Education provided Yes   Education Details see self care   Person(s) Educated Patient;Spouse   Methods Explanation   Comprehension Verbalized understanding          PT Short Term Goals - 10/12/16 1458      PT SHORT TERM GOAL #1   Title Pt will be independent with initial HEP in order to indicate improved functional mobility and decreased fall risk.  (Target Date: 10/10/16)   Time 4   Period Weeks   Status New     PT SHORT TERM GOAL #2   Title Will assess cervical ROM and set appropriate LTGs.    Time 4   Period Weeks   Status Achieved     PT SHORT TERM GOAL #3   Title Pt will report no more than 6/10 upper thoracic/cervical back pain in order to indicate improved function.     Baseline has been consistently 7/10 for several visits   Time 4   Period Weeks   Status Partially Met     PT SHORT TERM GOAL #4   Title Will assess DGI and improve score by 2 points in order to indicate decreased fall risk.     Baseline 20/24 on 10/12/16   Time 4   Period Weeks   Status Achieved     PT SHORT TERM GOAL #5   Title Pt will ambulate up to 200' over indoor surfaces w/ SPC at mod I level in order to indicate improved home negotiation.     Baseline met 10/12/16   Time 4   Period Weeks   Status Achieved     PT SHORT TERM GOAL #6   Title Pt will improve TUG to </=15 secs w/ LRAD in order to indicate decreased fall risk.     Baseline 12.03 secs on 10/12/16   Time 4   Period Weeks   Status Achieved     PT SHORT TERM GOAL #7   Title Pt will improve gait speed to >/=2.62 ft/sec w/ LRAD in order to indicate decreased fall risk and improved efficiency of gait.     Baseline 3.2 ft/sec on 10/12/16   Time 4   Period Weeks   Status Achieved           PT Long Term Goals - 09/17/16 1133      PT LONG TERM GOAL #1   Title Pt will be independent with final  HEP in order  to indicate improved function and decreased fall risk.  (Target Date: 11/09/16)   Time 8   Period Weeks   Status New     PT LONG TERM GOAL #2   Title Pt will improve DGI by 4 points from baseline in order to indicate decreased fall risk.     Time 8   Period Weeks   Status New     PT LONG TERM GOAL #3   Title Pt will report no more than 4/10 pain in upper thoracic/cervical region in order to indicate improved function.     Time 8   Period Weeks   Status New     PT LONG TERM GOAL #4   Title Pt will improve TUG </=13.5 secs w/ LRAD in order to indicate decreased fall risk.     Time 8   Period Weeks   Status New     PT LONG TERM GOAL #5   Title Pt will ambulate up to 500' over unlevel paved outdoor surfaces w/ LRAD at mod I level in order to indicate safe community negotiation.    Time 8   Period Weeks   Status New     PT LONG TERM GOAL #6   Title Pt will improve ABC FOTO score to >/=50% in order to indicate self reported improved confidence in balance.     Time 8   Period Weeks   Status New     PT LONG TERM GOAL #7   Title Pt will improve cervical ROM (flex/ext/R/Lrotation) by 10 deg in order to indicate improved functional and decreased pain.     Time 8   Period Weeks   Status New               Plan - 10/15/16 2006    Clinical Impression Statement Skilled session focused on gait over outdoor surfaces, addressing endurance deficits and high level balance with emphasis on SLS.  Pt tolerated very well.     Rehab Potential Good   Clinical Impairments Affecting Rehab Potential co-morbidities   PT Frequency 2x / week   PT Duration 8 weeks   PT Treatment/Interventions ADLs/Self Care Home Management;Electrical Stimulation;Traction;Ultrasound;DME Instruction;Gait training;Stair training;Functional mobility training;Therapeutic activities;Therapeutic exercise;Balance training;Neuromuscular re-education;Patient/family education;Manual techniques;Passive range of  motion;Dry needling;Energy conservation;Vestibular;Other (comment)  thoracic grade V manipulations   PT Next Visit Plan Assess HEP as able.  Gait for improved quality, Continue with gentle cervical/thoracic mobilization as able (unable to lie prone due to trach), postural exercises, HEP for postural strength and balance, BLE strength.     Consulted and Agree with Plan of Care Patient;Family member/caregiver   Family Member Consulted Husband [name]      Patient will benefit from skilled therapeutic intervention in order to improve the following deficits and impairments:  Abnormal gait, Decreased activity tolerance, Decreased balance, Decreased endurance, Decreased knowledge of use of DME, Decreased mobility, Decreased range of motion, Decreased safety awareness, Decreased strength, Hypomobility, Impaired perceived functional ability, Impaired flexibility, Impaired sensation, Improper body mechanics, Postural dysfunction, Pain  Visit Diagnosis: Unsteadiness on feet  Muscle weakness (generalized)  Other abnormalities of gait and mobility  Repeated falls     Problem List Patient Active Problem List   Diagnosis Date Noted  . Peripheral neuropathy 08/28/2016  . Diabetic peripheral neuropathy ([place]) 08/28/2016  . Leg weakness, bilateral 08/13/2016  . Gait abnormality 08/13/2016  . Carcinoma of overlapping sites of right breast in female, estrogen receptor negative ([place]) 12/27/2015  . Malignant neoplasm of breast ([place]) 03/28/2015  .  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 Hypercholesterolemia 03/28/2015  . Recurrent major depressive disorder, in partial remission ([place]) 03/01/2015  . 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 Cephalalgia 09/15/2014  . Headache due to trauma 09/15/2014  . Osteoporosis of multiple sites 01/07/2014  . History of colonic polyps 12/14/2013  . History of breast cancer 11/26/2012  . H/O malignant neoplasm of breast 11/26/2012  .  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 Tracheostomy present ([place]) 12/06/2011  . Calcification of bronchus or trachea 01/16/2011  . 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 Anxiety state 12/19/2010  . Blood in sputum 12/19/2010  . HLD (hyperlipidemia) 12/19/2010  . Type 2 diabetes mellitus ([place]) 12/19/2010    Cameron Sprang, PT, MPT [facility] [address] [place], [place], [address] Phone: [phone]   Fax:  [phone] 10/15/16, 8:09 PM  Name: Angela David MRN: [id number] Date of Birth: [birth year]

## 2016-10-18 ENCOUNTER — Ambulatory Visit: Payer: Medicare Other | Admitting: Rehabilitation

## 2016-10-19 ENCOUNTER — Ambulatory Visit: Payer: Medicare Other | Admitting: Rehabilitation

## 2016-10-19 ENCOUNTER — Encounter: Payer: Self-pay | Admitting: Rehabilitation

## 2016-10-19 DIAGNOSIS — M6281 Muscle weakness (generalized): Secondary | ICD-10-CM | POA: Diagnosis not present

## 2016-10-19 DIAGNOSIS — R2689 Other abnormalities of gait and mobility: Secondary | ICD-10-CM

## 2016-10-19 DIAGNOSIS — M542 Cervicalgia: Secondary | ICD-10-CM

## 2016-10-19 DIAGNOSIS — M546 Pain in thoracic spine: Secondary | ICD-10-CM

## 2016-10-19 NOTE — Therapy (Signed)
[facility] [address] [place], [place], [address] Phone: [phone]   Fax:  [phone]  Physical Therapy Treatment  Patient Details  Name: Angela David MRN: [id number] Date of Birth: 1940-12-11 Referring Provider: Margette Fast, MD  Encounter Date: 10/19/2016      PT End of Session - 10/19/16 1646    Visit Number 9   Number of Visits 17   Date for PT Re-Evaluation 11/09/16   Authorization Type MCR G Code on every 10th visit   PT Start Time 1316   PT Stop Time 1400   PT Time Calculation (min) 44 min   Activity Tolerance Patient tolerated treatment well   Behavior During Therapy [facility] for tasks assessed/performed      Past Medical History:  Diagnosis Date  . Breast cancer ([place]) 2002   right breast chemo and radiation  . Cancer [facility]) 2002   right breast  . Diabetic peripheral neuropathy ([place]) 08/28/2016  . Glaucoma   . High cholesterol   . History of colon polyps   . Peripheral neuropathy 08/28/2016  . Personal history of chemotherapy 2002   BREAST CA  . Personal history of malignant neoplasm of breast   . Personal history of radiation therapy 2002   BREAST CA  . Sleep apnea 2011    Past Surgical History:  Procedure Laterality Date  . ABDOMINAL HYSTERECTOMY    . BREAST BIOPSY Right 2008   neg  . BREAST EXCISIONAL BIOPSY Right 2002   positive  . BREAST SURGERY Right 2002   LUMPECTOMY, radiation, chemotherapy  . COLONOSCOPY  2009,12/30/2013  . HYDROCELE EXCISION / REPAIR    . TRACHEAL SURGERY  2013  . TRACHEOSTOMY N/A     There were no vitals filed for this visit.      Subjective Assessment - 10/19/16 1320    Subjective Reports MD visit went okay.  No surgery needed, but did not give injection.  She reports she has to go to pain management MD first week in Sept.     Patient is accompained by: Family member   Limitations House hold activities;Walking;Standing   Patient Stated Goals "I  want to walk normal"    Currently in Pain? Yes   Pain Score 8    Pain Location Thoracic   Pain Orientation Upper   Pain Descriptors / Indicators Aching   Pain Type Chronic pain   Pain Onset More than a month ago   Pain Frequency Constant   Aggravating Factors  standing, doing too much    Pain Relieving Factors resting                          [facility] Adult PT Treatment/Exercise - 10/19/16 0001      Self-Care   Self-Care Other Self-Care Comments   Other Self-Care Comments  Feel that since MD is holding off on any surgery or injection at this time, pt is still appropriate for dry needling.  Discussed benefits to this and that PT feels she really could benefit from having this done to thoracic paraspinal region as this is where PT can palpate several areas of trigger points and tightness.  Pt and husband verbalize understanding and are to schedule next week if possible.       Neck Exercises: Seated   Lateral Flexion Right  7 reps   Lateral Flexion Limitations Had pt hold L hand to mat to better stabilize shoulders/trunk while performing and  also PT provided slight overpressure for last 3 reps to ensure proper technique.  Pt tolerated well.       Neck Exercises: Supine   Other Supine Exercise Supine isometric scapular retraction x 10 reps with cues for technique.    Other Supine Exercise Had pt stabilize yellow theraband at side while opposite UE pulled resistance in diagonal pattern away from body x 10 reps on each side with tactile cues as needed for technique.  Supine shoulder extension with PT providing light resistance upward as able to tolerate with cues for technique.       Manual Therapy   Manual Therapy Soft tissue mobilization;Scapular mobilization;Myofascial release;Manual Traction   Soft tissue mobilization Performed soft tissue mobilization to B thoracic level paraspinals.  Note increased tension on L side, but more tension along R scapula.     Myofascial  Release Performed trigger point release as able to B thoracic paraspinals.     Scapular Mobilization In SL, performed light scapular mobilization as able to tolerate with soft tissue mobilization during scapular mobilization.     Manual Traction Manual traction x 2-3 mins with head supported on mat progressing to performing manual traction with head off EOM in order to provide light retraction mobilization with movement.  Pt tolerated well.                  PT Education - 10/19/16 1645    Education provided Yes   Education Details see self care   Person(s) Educated Patient;Spouse   Methods Explanation   Comprehension Verbalized understanding          PT Short Term Goals - 10/12/16 1458      PT SHORT TERM GOAL #1   Title Pt will be independent with initial HEP in order to indicate improved functional mobility and decreased fall risk.  (Target Date: 10/10/16)   Time 4   Period Weeks   Status New     PT SHORT TERM GOAL #2   Title Will assess cervical ROM and set appropriate LTGs.    Time 4   Period Weeks   Status Achieved     PT SHORT TERM GOAL #3   Title Pt will report no more than 6/10 upper thoracic/cervical back pain in order to indicate improved function.     Baseline has been consistently 7/10 for several visits   Time 4   Period Weeks   Status Partially Met     PT SHORT TERM GOAL #4   Title Will assess DGI and improve score by 2 points in order to indicate decreased fall risk.     Baseline 20/24 on 10/12/16   Time 4   Period Weeks   Status Achieved     PT SHORT TERM GOAL #5   Title Pt will ambulate up to 200' over indoor surfaces w/ SPC at mod I level in order to indicate improved home negotiation.     Baseline met 10/12/16   Time 4   Period Weeks   Status Achieved     PT SHORT TERM GOAL #6   Title Pt will improve TUG to </=15 secs w/ LRAD in order to indicate decreased fall risk.     Baseline 12.03 secs on 10/12/16   Time 4   Period Weeks   Status  Achieved     PT SHORT TERM GOAL #7   Title Pt will improve gait speed to >/=2.62 ft/sec w/ LRAD in order to indicate decreased fall risk and  improved efficiency of gait.     Baseline 3.2 ft/sec on 10/12/16   Time 4   Period Weeks   Status Achieved           PT Long Term Goals - 09/17/16 1133      PT LONG TERM GOAL #1   Title Pt will be independent with final HEP in order to indicate improved function and decreased fall risk.  (Target Date: 11/09/16)   Time 8   Period Weeks   Status New     PT LONG TERM GOAL #2   Title Pt will improve DGI by 4 points from baseline in order to indicate decreased fall risk.     Time 8   Period Weeks   Status New     PT LONG TERM GOAL #3   Title Pt will report no more than 4/10 pain in upper thoracic/cervical region in order to indicate improved function.     Time 8   Period Weeks   Status New     PT LONG TERM GOAL #4   Title Pt will improve TUG </=13.5 secs w/ LRAD in order to indicate decreased fall risk.     Time 8   Period Weeks   Status New     PT LONG TERM GOAL #5   Title Pt will ambulate up to 500' over unlevel paved outdoor surfaces w/ LRAD at mod I level in order to indicate safe community negotiation.    Time 8   Period Weeks   Status New     PT LONG TERM GOAL #6   Title Pt will improve ABC FOTO score to >/=50% in order to indicate self reported improved confidence in balance.     Time 8   Period Weeks   Status New     PT LONG TERM GOAL #7   Title Pt will improve cervical ROM (flex/ext/R/Lrotation) by 10 deg in order to indicate improved functional and decreased pain.     Time 8   Period Weeks   Status New               Plan - 10/19/16 1646    Clinical Impression Statement Skilled session focused on thoracic and scapular manual therapy and trigger point release due to high level of pain at beginning of session.  Pt did not have any relief of pain following manual therapy.  PT still recommends she have dry  needling done for pain relief.  Pt and husband verbalize understanding.     Rehab Potential Good   Clinical Impairments Affecting Rehab Potential co-morbidities   PT Frequency 2x / week   PT Duration 8 weeks   PT Treatment/Interventions ADLs/Self Care Home Management;Electrical Stimulation;Traction;Ultrasound;DME Instruction;Gait training;Stair training;Functional mobility training;Therapeutic activities;Therapeutic exercise;Balance training;Neuromuscular re-education;Patient/family education;Manual techniques;Passive range of motion;Dry needling;Energy conservation;Vestibular;Other (comment)  thoracic grade V manipulations   PT Next Visit Plan Gcode/PN!!! dry needling, Gait for improved quality, Continue with gentle cervical/thoracic mobilization as able (unable to lie prone due to trach), postural exercises, HEP for postural strength and balance, BLE strength.     Consulted and Agree with Plan of Care Patient;Family member/caregiver   Family Member Consulted Husband [name]      Patient will benefit from skilled therapeutic intervention in order to improve the following deficits and impairments:  Abnormal gait, Decreased activity tolerance, Decreased balance, Decreased endurance, Decreased knowledge of use of DME, Decreased mobility, Decreased range of motion, Decreased safety awareness, Decreased strength, Hypomobility, Impaired perceived functional ability, Impaired  flexibility, Impaired sensation, Improper body mechanics, Postural dysfunction, Pain  Visit Diagnosis: Muscle weakness (generalized)  Other abnormalities of gait and mobility  Cervicalgia  Pain in thoracic spine     Problem List Patient Active Problem List   Diagnosis Date Noted  . Peripheral neuropathy 08/28/2016  . Diabetic peripheral neuropathy ([place]) 08/28/2016  . Leg weakness, bilateral 08/13/2016  . Gait abnormality 08/13/2016  . Carcinoma of overlapping sites of right breast in female, estrogen receptor negative  ([place]) 12/27/2015  . Malignant neoplasm of breast ([place]) 03/28/2015  . 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 Hypercholesterolemia 03/28/2015  . Recurrent major depressive disorder, in partial remission ([place]) 03/01/2015  . 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 Cephalalgia 09/15/2014  . Headache due to trauma 09/15/2014  . Osteoporosis of multiple sites 01/07/2014  . History of colonic polyps 12/14/2013  . History of breast cancer 11/26/2012  . H/O malignant neoplasm of breast 11/26/2012  . 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 Tracheostomy present ([place]) 12/06/2011  . Calcification of bronchus or trachea 01/16/2011  . 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 Anxiety state 12/19/2010  . Blood in sputum 12/19/2010  . HLD (hyperlipidemia) 12/19/2010  . Type 2 diabetes mellitus ([place]) 12/19/2010    Cameron Sprang, PT, MPT [facility] [address] [place], [place], [address] Phone: [phone]   Fax:  [phone] 10/19/16, 4:52 PM  Name: Corie Allis MRN: [id number] Date of Birth: 09-18-1940

## 2016-10-22 ENCOUNTER — Ambulatory Visit: Payer: Medicare Other | Admitting: Rehabilitation

## 2016-10-22 ENCOUNTER — Encounter: Payer: Self-pay | Admitting: Rehabilitation

## 2016-10-22 DIAGNOSIS — M546 Pain in thoracic spine: Secondary | ICD-10-CM

## 2016-10-22 DIAGNOSIS — M542 Cervicalgia: Secondary | ICD-10-CM

## 2016-10-22 DIAGNOSIS — M6281 Muscle weakness (generalized): Secondary | ICD-10-CM | POA: Diagnosis not present

## 2016-10-22 DIAGNOSIS — R296 Repeated falls: Secondary | ICD-10-CM

## 2016-10-22 DIAGNOSIS — R2689 Other abnormalities of gait and mobility: Secondary | ICD-10-CM

## 2016-10-22 DIAGNOSIS — R2681 Unsteadiness on feet: Secondary | ICD-10-CM

## 2016-10-22 NOTE — Therapy (Signed)
[facility] [address] [place], [place], [address] Phone: [phone]   Fax:  [phone]  Physical Therapy Treatment  Patient Details  Name: Angela David MRN: [id number] Date of Birth: 1940-09-21 Referring Provider: Margette Fast, MD  Encounter Date: 10/22/2016      PT End of Session - 10/22/16 1439    Visit Number 10   Number of Visits 17   Date for PT Re-Evaluation 11/09/16   Authorization Type MCR G Code on every 10th visit   PT Start Time 1430   PT Stop Time 1515   PT Time Calculation (min) 45 min   Activity Tolerance Patient tolerated treatment well   Behavior During Therapy [facility] for tasks assessed/performed      Past Medical History:  Diagnosis Date  . Breast cancer ([place]) 2002   right breast chemo and radiation  . Cancer [facility]) 2002   right breast  . Diabetic peripheral neuropathy ([place]) 08/28/2016  . Glaucoma   . High cholesterol   . History of colon polyps   . Peripheral neuropathy 08/28/2016  . Personal history of chemotherapy 2002   BREAST CA  . Personal history of malignant neoplasm of breast   . Personal history of radiation therapy 2002   BREAST CA  . Sleep apnea 2011    Past Surgical History:  Procedure Laterality Date  . ABDOMINAL HYSTERECTOMY    . BREAST BIOPSY Right 2008   neg  . BREAST EXCISIONAL BIOPSY Right 2002   positive  . BREAST SURGERY Right 2002   LUMPECTOMY, radiation, chemotherapy  . COLONOSCOPY  2009,12/30/2013  . HYDROCELE EXCISION / REPAIR    . TRACHEAL SURGERY  2013  . TRACHEOSTOMY N/A     There were no vitals filed for this visit.      Subjective Assessment - 10/22/16 1434    Subjective Pt reports that wedding over the weekend went well.  Walked up/down hill over grass.    Patient is accompained by: Family member   Limitations House hold activities;Walking;Standing   Patient Stated Goals "I want to walk normal"    Currently in Pain? Yes   Pain Score  6    Pain Location Back   Pain Orientation Upper   Pain Descriptors / Indicators Aching   Pain Type Chronic pain   Pain Onset More than a month ago   Pain Frequency Constant   Aggravating Factors  standing, doing too much   Pain Relieving Factors resting                          [facility] Adult PT Treatment/Exercise - 10/22/16 0001      Neuro Re-ed    Neuro Re-ed Details  Continue to work on high level balance with rocker board tasks.  Standing on small rocker board oriented ant/post with BUE>no UE support maintaining balance x 2 reps of 30 secs progressing to performing vertical head turns x 10 reps with cues for slowed controlled movement.  Turned board horizontally and note marked increased difficulty and continued LOB to the R, despite use of mirror and pt with limited ability to correct with hip movements as she tends to attempt to correct with head/trunk movements.        Manual Therapy   Manual Therapy Joint mobilization;Manual Traction;Other (comment)   Manual therapy comments Continue to address cervical and thoracic spine joint mobility and pain.     Joint Mobilization Performed supine lateral  glide joint mobilization to cervical spine with glide to the L to open vertebra on the L for improved motion.  Pt able to tolerate up to grade III mob.  Once seated, still note cervical lateral flexion to the L side, therefore had pt get into R SL (then L SL) for lateral glides to increase joint mobility and also provide increased stretch to cervical spine musculature.  Pt with good mobility on the L side of cervical spine, however is limited on the R and demos increased sensitivity on the R, therefore only able to perform grade I/II on the R, but up to grade III on L.  Note pt continues to have very limited mobility in upper thoracic spine, therefore had pt obtain Grade V thoracic manipulation position (supine) while PT mobilized thoracic spine in this position.  Pt tolerated better  than last attempt.  Put her into position again with deeper range and more force for Grade V mob.  No cavitation noted, but pt with improved posture and states improved flexibilty once returned to seated position.     Manual Traction Manual traction with R cervical lateral flexion to decrease pain and improve flexibilty.  Pt tolerated well.                  PT Education - 10/22/16 1439    Education provided Yes   Education Details continuing POC if continuing to make progress vs D/C at end of POC   Person(s) Educated Patient   Methods Explanation   Comprehension Verbalized understanding          PT Short Term Goals - 10/12/16 1458      PT SHORT TERM GOAL #1   Title Pt will be independent with initial HEP in order to indicate improved functional mobility and decreased fall risk.  (Target Date: 10/10/16)   Time 4   Period Weeks   Status New     PT SHORT TERM GOAL #2   Title Will assess cervical ROM and set appropriate LTGs.    Time 4   Period Weeks   Status Achieved     PT SHORT TERM GOAL #3   Title Pt will report no more than 6/10 upper thoracic/cervical back pain in order to indicate improved function.     Baseline has been consistently 7/10 for several visits   Time 4   Period Weeks   Status Partially Met     PT SHORT TERM GOAL #4   Title Will assess DGI and improve score by 2 points in order to indicate decreased fall risk.     Baseline 20/24 on 10/12/16   Time 4   Period Weeks   Status Achieved     PT SHORT TERM GOAL #5   Title Pt will ambulate up to 200' over indoor surfaces w/ SPC at mod I level in order to indicate improved home negotiation.     Baseline met 10/12/16   Time 4   Period Weeks   Status Achieved     PT SHORT TERM GOAL #6   Title Pt will improve TUG to </=15 secs w/ LRAD in order to indicate decreased fall risk.     Baseline 12.03 secs on 10/12/16   Time 4   Period Weeks   Status Achieved     PT SHORT TERM GOAL #7   Title Pt will  improve gait speed to >/=2.62 ft/sec w/ LRAD in order to indicate decreased fall risk and improved efficiency of gait.  Baseline 3.2 ft/sec on 10/12/16   Time 4   Period Weeks   Status Achieved           PT Long Term Goals - 09/17/16 1133      PT LONG TERM GOAL #1   Title Pt will be independent with final HEP in order to indicate improved function and decreased fall risk.  (Target Date: 11/09/16)   Time 8   Period Weeks   Status New     PT LONG TERM GOAL #2   Title Pt will improve DGI by 4 points from baseline in order to indicate decreased fall risk.     Time 8   Period Weeks   Status New     PT LONG TERM GOAL #3   Title Pt will report no more than 4/10 pain in upper thoracic/cervical region in order to indicate improved function.     Time 8   Period Weeks   Status New     PT LONG TERM GOAL #4   Title Pt will improve TUG </=13.5 secs w/ LRAD in order to indicate decreased fall risk.     Time 8   Period Weeks   Status New     PT LONG TERM GOAL #5   Title Pt will ambulate up to 500' over unlevel paved outdoor surfaces w/ LRAD at mod I level in order to indicate safe community negotiation.    Time 8   Period Weeks   Status New     PT LONG TERM GOAL #6   Title Pt will improve ABC FOTO score to >/=50% in order to indicate self reported improved confidence in balance.     Time 8   Period Weeks   Status New     PT LONG TERM GOAL #7   Title Pt will improve cervical ROM (flex/ext/R/Lrotation) by 10 deg in order to indicate improved functional and decreased pain.     Time 8   Period Weeks   Status New               Plan - 10/22/16 2001    Clinical Impression Statement Skilled therapy focused on improved mobilization of cervical and thoracic spine.  Pt with improved pain within session, however am finding decreased carryover between sessions.  Feel that pt has limited kinesthetic awareness of posture as she has been in laterally flexed position for so long.   Will continue to address.   Also worked on high level balance with hip strategy briefly with noted diffculty with lateral weight shifts.     Rehab Potential Good   Clinical Impairments Affecting Rehab Potential co-morbidities   PT Frequency 2x / week   PT Duration 8 weeks   PT Treatment/Interventions ADLs/Self Care Home Management;Electrical Stimulation;Traction;Ultrasound;DME Instruction;Gait training;Stair training;Functional mobility training;Therapeutic activities;Therapeutic exercise;Balance training;Neuromuscular re-education;Patient/family education;Manual techniques;Passive range of motion;Dry needling;Energy conservation;Vestibular;Other (comment)  thoracic grade V manipulations   PT Next Visit Plan muscle energy for lateral cervical musculature (dynamic progressive), kinesthetic awareness with laser,  dry needling, Gait for improved quality, Continue with gentle cervical/thoracic mobilization as able (unable to lie prone due to trach), postural exercises, HEP for postural strength and balance, BLE strength.     Consulted and Agree with Plan of Care Patient;Family member/caregiver   Family Member Consulted Husband [name]      Patient will benefit from skilled therapeutic intervention in order to improve the following deficits and impairments:  Abnormal gait, Decreased activity tolerance, Decreased balance, Decreased endurance, Decreased knowledge  of use of DME, Decreased mobility, Decreased range of motion, Decreased safety awareness, Decreased strength, Hypomobility, Impaired perceived functional ability, Impaired flexibility, Impaired sensation, Improper body mechanics, Postural dysfunction, Pain  Visit Diagnosis: Muscle weakness (generalized)  Other abnormalities of gait and mobility  Cervicalgia  Pain in thoracic spine  Unsteadiness on feet  Repeated falls       G-Codes - 10-31-2016 09-May-2002    Functional Assessment Tool Used (Outpatient Only) TUG: 12.03 secs at mod I level, gait  speed  3.20 ft/sec at mod I to S level on 10/12/16   Functional Limitation Mobility: Walking and moving around   Mobility: Walking and Moving Around Current Status [phone]) At least 1 percent but less than 20 percent impaired, limited or restricted   Mobility: Walking and Moving Around Goal Status [phone]) At least 1 percent but less than 20 percent impaired, limited or restricted      Physical Therapy Progress Note  Dates of Reporting Period: 09/10/16 to 10-31-16  Objective Reports of Subjective Statement: See above  Objective Measurements: See G code above  Goal Update: Continue towards LTGs  Plan: Continue POC  Reason Skilled Services are Required: Pt continues to demonstrate high level balance deficits as well as continued thoracic pain with decreased ROM and spinal mobility in cervical and thoracic spine.        Problem List Patient Active Problem List   Diagnosis Date Noted  . Peripheral neuropathy 08/28/2016  . Diabetic peripheral neuropathy ([place]) 08/28/2016  . Leg weakness, bilateral 08/13/2016  . Gait abnormality 08/13/2016  . Carcinoma of overlapping sites of right breast in female, estrogen receptor negative ([place]) 12/27/2015  . Malignant neoplasm of breast ([place]) 03/28/2015  . 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 Hypercholesterolemia 03/28/2015  . Recurrent major depressive disorder, in partial remission ([place]) 03/01/2015  . 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 Cephalalgia 09/15/2014  . Headache due to trauma 09/15/2014  . Osteoporosis of multiple sites 01/07/2014  . History of colonic polyps 12/14/2013  . History of breast cancer 11/26/2012  . H/O malignant neoplasm of breast 11/26/2012  . 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 Tracheostomy present ([place]) 12/06/2011  . Calcification of bronchus or trachea 01/16/2011  . 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 Anxiety state 12/19/2010  . Blood in sputum 12/19/2010  . HLD  (hyperlipidemia) 12/19/2010  . Type 2 diabetes mellitus ([place]) 12/19/2010    Cameron Sprang, PT, MPT [facility] [place] [address] [place], [place], [address] Phone: [phone]   Fax:  [phone] 10/31/2016, 8:09 PM  Name: Angela David MRN: [id number] Date of Birth: 1940/05/29

## 2016-10-25 ENCOUNTER — Ambulatory Visit: Payer: Medicare Other | Admitting: Rehabilitation

## 2016-10-25 DIAGNOSIS — M542 Cervicalgia: Secondary | ICD-10-CM

## 2016-10-25 DIAGNOSIS — M6281 Muscle weakness (generalized): Secondary | ICD-10-CM | POA: Diagnosis not present

## 2016-10-25 DIAGNOSIS — R2689 Other abnormalities of gait and mobility: Secondary | ICD-10-CM

## 2016-10-25 NOTE — Therapy (Signed)
[facility] [address] [place], [place], [address] Phone: [phone]   Fax:  [phone]  Physical Therapy Treatment  Patient Details  Name: Angela David MRN: [id number] Date of Birth: June 14, 1940 Referring Provider: Margette Fast, MD  Encounter Date: 10/25/2016      PT End of Session - 10/25/16 2052    Visit Number 11   Number of Visits 17   Date for PT Re-Evaluation 11/09/16   Authorization Type MCR G Code on every 10th visit   PT Start Time 1447   PT Stop Time 1531   PT Time Calculation (min) 44 min   Activity Tolerance Patient tolerated treatment well   Behavior During Therapy [facility] for tasks assessed/performed      Past Medical History:  Diagnosis Date  . Breast cancer ([place]) 2002   right breast chemo and radiation  . Cancer [facility]) 2002   right breast  . Diabetic peripheral neuropathy ([place]) 08/28/2016  . Glaucoma   . High cholesterol   . History of colon polyps   . Peripheral neuropathy 08/28/2016  . Personal history of chemotherapy 2002   BREAST CA  . Personal history of malignant neoplasm of breast   . Personal history of radiation therapy 2002   BREAST CA  . Sleep apnea 2011    Past Surgical History:  Procedure Laterality Date  . ABDOMINAL HYSTERECTOMY    . BREAST BIOPSY Right 2008   neg  . BREAST EXCISIONAL BIOPSY Right 2002   positive  . BREAST SURGERY Right 2002   LUMPECTOMY, radiation, chemotherapy  . COLONOSCOPY  2009,12/30/2013  . HYDROCELE EXCISION / REPAIR    . TRACHEAL SURGERY  2013  . TRACHEOSTOMY N/A     There were no vitals filed for this visit.      Subjective Assessment - 10/25/16 1452    Subjective Pt reports 6/10 pain today, otherwise no changes.    Patient is accompained by: Family member   Limitations House hold activities;Walking;Standing   Patient Stated Goals "I want to walk normal"    Currently in Pain? Yes   Pain Score 6    Pain Location Back   Pain  Orientation Upper   Pain Descriptors / Indicators Aching   Pain Type Chronic pain   Pain Onset More than a month ago   Pain Frequency Constant   Aggravating Factors  standing, doing too much   Pain Relieving Factors resting                          [facility] Adult PT Treatment/Exercise - 10/25/16 0001      Self-Care   Self-Care Other Self-Care Comments   Other Self-Care Comments  Discussed progress with balance and mobility and would like to see results from dry needling.  If no marked improvement in neck/back pain following this, PT would like to send note to MD regarding possible botox to L side of neck due to extensive muscle tightness.  Discussed this with pt just to provide education on options but feel that she will be ready for D/C at end of POC.       Neuro Re-ed    Neuro Re-ed Details  Worked on improving cervical kinesthetics during session with use of headlight.  Had pt stand and place light in middle of two targets then having her look to target to the R, close eyes rotate head L and then back to the R where she  pictured target on R to be.  Pt very close to target on R side, however when performing to the L was at least 4" off of target.  Also had perform cervical lateral flexion moving laser target (on headlight) from one side of target to the next.  Pt tolerated well.       Exercises   Exercises Neck     Neck Exercises: Seated   Other Seated Exercise Seated PNF/muscle energy technique to cervical lateral flexors.  Performed resisted movement followed by active movement to full range.  PT added R lateral flexion stretch with overpressure in between reps of resistance and moving into ROM.  Pt tolerated well with improvement back to midline noted following exercises.  Pt with difficulty performing task without trunk lateral flexion and without shoulder elevation.       Manual Therapy   Manual Therapy Joint mobilization   Joint Mobilization Performed supine lateral  glide to R cervical spine (towards the L) to open facet joints and improve motion on the L.  Provided up to grade III mobilization as pt able to tolerate with decreased mobility noted at mid to lower cervical spine.  Attempted to perform L first rib mobilization x 2 reps, however due to sensitivity and marked L scalene tightness, pt unable to tolerate full mobilization.                  PT Education - 10/25/16 2052    Education provided Yes   Education Details see self care   Person(s) Educated Patient   Methods Explanation   Comprehension Verbalized understanding          PT Short Term Goals - 10/12/16 1458      PT SHORT TERM GOAL #1   Title Pt will be independent with initial HEP in order to indicate improved functional mobility and decreased fall risk.  (Target Date: 10/10/16)   Time 4   Period Weeks   Status New     PT SHORT TERM GOAL #2   Title Will assess cervical ROM and set appropriate LTGs.    Time 4   Period Weeks   Status Achieved     PT SHORT TERM GOAL #3   Title Pt will report no more than 6/10 upper thoracic/cervical back pain in order to indicate improved function.     Baseline has been consistently 7/10 for several visits   Time 4   Period Weeks   Status Partially Met     PT SHORT TERM GOAL #4   Title Will assess DGI and improve score by 2 points in order to indicate decreased fall risk.     Baseline 20/24 on 10/12/16   Time 4   Period Weeks   Status Achieved     PT SHORT TERM GOAL #5   Title Pt will ambulate up to 200' over indoor surfaces w/ SPC at mod I level in order to indicate improved home negotiation.     Baseline met 10/12/16   Time 4   Period Weeks   Status Achieved     PT SHORT TERM GOAL #6   Title Pt will improve TUG to </=15 secs w/ LRAD in order to indicate decreased fall risk.     Baseline 12.03 secs on 10/12/16   Time 4   Period Weeks   Status Achieved     PT SHORT TERM GOAL #7   Title Pt will improve gait speed to >/=2.62  ft/sec w/ LRAD in order  to indicate decreased fall risk and improved efficiency of gait.     Baseline 3.2 ft/sec on 10/12/16   Time 4   Period Weeks   Status Achieved           PT Long Term Goals - 09/17/16 1133      PT LONG TERM GOAL #1   Title Pt will be independent with final HEP in order to indicate improved function and decreased fall risk.  (Target Date: 11/09/16)   Time 8   Period Weeks   Status New     PT LONG TERM GOAL #2   Title Pt will improve DGI by 4 points from baseline in order to indicate decreased fall risk.     Time 8   Period Weeks   Status New     PT LONG TERM GOAL #3   Title Pt will report no more than 4/10 pain in upper thoracic/cervical region in order to indicate improved function.     Time 8   Period Weeks   Status New     PT LONG TERM GOAL #4   Title Pt will improve TUG </=13.5 secs w/ LRAD in order to indicate decreased fall risk.     Time 8   Period Weeks   Status New     PT LONG TERM GOAL #5   Title Pt will ambulate up to 500' over unlevel paved outdoor surfaces w/ LRAD at mod I level in order to indicate safe community negotiation.    Time 8   Period Weeks   Status New     PT LONG TERM GOAL #6   Title Pt will improve ABC FOTO score to >/=50% in order to indicate self reported improved confidence in balance.     Time 8   Period Weeks   Status New     PT LONG TERM GOAL #7   Title Pt will improve cervical ROM (flex/ext/R/Lrotation) by 10 deg in order to indicate improved functional and decreased pain.     Time 8   Period Weeks   Status New               Plan - 10/25/16 2053    Clinical Impression Statement Skilled session focused on NMR to improve cervical kinesthetic awareness of posture/positioning.  Also continue with manual therapy to improve motion on R side of cervical spine.  Discussion of other options past PT.     Rehab Potential Good   Clinical Impairments Affecting Rehab Potential co-morbidities   PT Frequency 2x  / week   PT Duration 8 weeks   PT Treatment/Interventions ADLs/Self Care Home Management;Electrical Stimulation;Traction;Ultrasound;DME Instruction;Gait training;Stair training;Functional mobility training;Therapeutic activities;Therapeutic exercise;Balance training;Neuromuscular re-education;Patient/family education;Manual techniques;Passive range of motion;Dry needling;Energy conservation;Vestibular;Other (comment)  thoracic grade V manipulations   PT Next Visit Plan muscle energy for lateral cervical musculature (dynamic progressive), kinesthetic awareness with laser,  dry needling, Gait for improved quality, Continue with gentle cervical/thoracic mobilization as able (unable to lie prone due to trach), postural exercises, HEP for postural strength and balance, BLE strength.     Consulted and Agree with Plan of Care Patient;Family member/caregiver   Family Member Consulted Husband [name]      Patient will benefit from skilled therapeutic intervention in order to improve the following deficits and impairments:  Abnormal gait, Decreased activity tolerance, Decreased balance, Decreased endurance, Decreased knowledge of use of DME, Decreased mobility, Decreased range of motion, Decreased safety awareness, Decreased strength, Hypomobility, Impaired perceived functional ability, Impaired flexibility,  Impaired sensation, Improper body mechanics, Postural dysfunction, Pain  Visit Diagnosis: Muscle weakness (generalized)  Other abnormalities of gait and mobility  Cervicalgia     Problem List Patient Active Problem List   Diagnosis Date Noted  . Peripheral neuropathy 08/28/2016  . Diabetic peripheral neuropathy ([place]) 08/28/2016  . Leg weakness, bilateral 08/13/2016  . Gait abnormality 08/13/2016  . Carcinoma of overlapping sites of right breast in female, estrogen receptor negative ([place]) 12/27/2015  . Malignant neoplasm of breast ([place]) 03/28/2015  . 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 Hypercholesterolemia 03/28/2015  . Recurrent major depressive disorder, in partial remission ([place]) 03/01/2015  . 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 Cephalalgia 09/15/2014  . Headache due to trauma 09/15/2014  . Osteoporosis of multiple sites 01/07/2014  . History of colonic polyps 12/14/2013  . History of breast cancer 11/26/2012  . H/O malignant neoplasm of breast 11/26/2012  . 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 Tracheostomy present ([place]) 12/06/2011  . Calcification of bronchus or trachea 01/16/2011  . 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 Anxiety state 12/19/2010  . Blood in sputum 12/19/2010  . HLD (hyperlipidemia) 12/19/2010  . Type 2 diabetes mellitus ([place]) 12/19/2010    Cameron Sprang, PT, MPT [facility] [address] [place], [place], [address] Phone: [phone]   Fax:  [phone] 10/25/16, 8:55 PM  Name: Melisa Donofrio MRN: [id number] Date of Birth: 10/15/1940

## 2016-10-26 ENCOUNTER — Other Ambulatory Visit: Payer: Self-pay

## 2016-10-26 DIAGNOSIS — Z1231 Encounter for screening mammogram for malignant neoplasm of breast: Secondary | ICD-10-CM

## 2016-11-01 ENCOUNTER — Telehealth: Payer: Self-pay | Admitting: Rehabilitation

## 2016-11-01 ENCOUNTER — Ambulatory Visit: Payer: Medicare Other | Attending: Neurology | Admitting: Rehabilitation

## 2016-11-01 ENCOUNTER — Encounter: Payer: Self-pay | Admitting: Rehabilitation

## 2016-11-01 DIAGNOSIS — M542 Cervicalgia: Secondary | ICD-10-CM | POA: Diagnosis present

## 2016-11-01 DIAGNOSIS — M546 Pain in thoracic spine: Secondary | ICD-10-CM | POA: Diagnosis present

## 2016-11-01 DIAGNOSIS — R2689 Other abnormalities of gait and mobility: Secondary | ICD-10-CM | POA: Insufficient documentation

## 2016-11-01 DIAGNOSIS — R2681 Unsteadiness on feet: Secondary | ICD-10-CM

## 2016-11-01 DIAGNOSIS — M6281 Muscle weakness (generalized): Secondary | ICD-10-CM

## 2016-11-01 DIAGNOSIS — R296 Repeated falls: Secondary | ICD-10-CM | POA: Insufficient documentation

## 2016-11-01 NOTE — Telephone Encounter (Signed)
Dr. Jannifer Franklin,   I have been seeing Mrs. Angela David at [facility] neuro PT for both balance and cervical deficits related to pain and poor ROM.  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  Note that we have done many exercises, stretching, and manual therapy to address cervical deficits, however we have not made a ton of improvement in this area.  She is going to get dry needling next week and I am curious to see how she does with this.  If this does not help, I am wondering if she would benefit from botox to L paraspinal/scalene muscles as she continuously holds her neck in L lateral flex.  Just wanted to let you know what we are seeing and get your feedback on this.    Thanks,  Cameron Sprang, PT, MPT [facility] [address] [place], [place], [address] Phone: [phone]   Fax:  [phone] 11/01/16, 4:11 PM

## 2016-11-01 NOTE — Telephone Encounter (Signed)
Noted.  It does not look like I have a schedule revisit with this patient to reassess her progress.

## 2016-11-01 NOTE — Therapy (Signed)
[place] [address] [place], [place], [address] Phone: [phone]   Fax:  [phone]  Physical Therapy Treatment  Patient Details  Name: Angela David MRN: [id number] Date of Birth: Aug 02, 1940 Referring Provider: Margette Fast, MD  Encounter Date: 11/01/2016      PT End of Session - 11/01/16 1454    Visit Number 12   Number of Visits 17   Date for PT Re-Evaluation 11/09/16   Authorization Type MCR G Code on every 10th visit   PT Start Time 1446   PT Stop Time 1530   PT Time Calculation (min) 44 min   Activity Tolerance Patient tolerated treatment well   Behavior During Therapy [facility] for tasks assessed/performed      Past Medical History:  Diagnosis Date  . Breast cancer ([place]) 2002   right breast chemo and radiation  . Cancer [facility]) 2002   right breast  . Diabetic peripheral neuropathy ([place]) 08/28/2016  . Glaucoma   . High cholesterol   . History of colon polyps   . Peripheral neuropathy 08/28/2016  . Personal history of chemotherapy 2002   BREAST CA  . Personal history of malignant neoplasm of breast   . Personal history of radiation therapy 2002   BREAST CA  . Sleep apnea 2011    Past Surgical History:  Procedure Laterality Date  . ABDOMINAL HYSTERECTOMY    . BREAST BIOPSY Right 2008   neg  . BREAST EXCISIONAL BIOPSY Right 2002   positive  . BREAST SURGERY Right 2002   LUMPECTOMY, radiation, chemotherapy  . COLONOSCOPY  2009,12/30/2013  . HYDROCELE EXCISION / REPAIR    . TRACHEAL SURGERY  2013  . TRACHEOSTOMY N/A     There were no vitals filed for this visit.      Subjective Assessment - 11/01/16 1449    Subjective Pt reports she went to pain clinic, however they did not prescribe any medication as she is ensuring it will not interfere with other meds.     Patient is accompained by: Family member   Limitations House hold activities;Walking;Standing   Patient Stated Goals "I want  to walk normal"    Currently in Pain? Yes   Pain Score 6    Pain Location Back   Pain Orientation Upper   Pain Descriptors / Indicators Burning   Pain Type Chronic pain   Pain Onset More than a month ago   Pain Frequency Constant   Aggravating Factors  standing, doing too much   Pain Relieving Factors resting                          [facility] Adult PT Treatment/Exercise - 11/01/16 0001      Self-Care   Self-Care Other Self-Care Comments   Other Self-Care Comments  Following several balance tasks today, feel that pt would benefit from continued therapy, therefore will plan to renew pt for 4 more weeks following next weeks' visit.  Also note that she would only have had single dry needling before plan of care is up, therefore also feel she would benefit from more than one dry needling session.  Pt and spouse verbalize understanding and agree to schedule more visits.       Neuro Re-ed    Neuro Re-ed Details  Continue to address poor ankle and hip reaction during session with rocker board tasks in // bars placed in horizontal presentation progressing from BUE>no UE support.  Pt continues to demo posterior and R lateral LOB during task.  Provided hands on facilitation at hips for adequate weight shift rather than using arms to "push" to other side.  Had her perform R and L weight shifts x 10 reps before trying to maintain balance again.  Had pt maintain balance x 3 reps of 20 secs.   Note improvement by end of task.  Then had pt stand on foam balance beam facing forward towards mirror for visual feedback on posture maintaining balance x 3 reps of 15 secs.  Again, tend to note that she loses balance to the R and posteriorly, despite cues for improving weight shift.  Continue to work on improving forward weight shift by having pt stand on ramp without mat feet shoulder width apart maintaining balance x 30 secs with cues for posture, but no LOB.  Progressed to feet staggered maintaining  balance x 15 secs.  Pt with marked difficulty with LLE placed behind but pt able to perform vertical head turns x 5 reps with RLE placed behind.  Continued with forward weight shift with wall bumps while standing on foam balance beam.  Note marked difficulty with 5 reps, therefore removed beam and had her perform with feet 4-5" from wall.  Improved technique and no difficulty, therefore had her perform with feet on pillows with chair in front for support/safety.  Pt has mild difficulty, therefore added this to HEP.  Pt performed x 5 reps during session.                 PT Education - 11/01/16 1558    Education provided Yes   Education Details see self care   Person(s) Educated Patient   Methods Explanation   Comprehension Verbalized understanding          PT Short Term Goals - 10/12/16 1458      PT SHORT TERM GOAL #1   Title Pt will be independent with initial HEP in order to indicate improved functional mobility and decreased fall risk.  (Target Date: 10/10/16)   Time 4   Period Weeks   Status New     PT SHORT TERM GOAL #2   Title Will assess cervical ROM and set appropriate LTGs.    Time 4   Period Weeks   Status Achieved     PT SHORT TERM GOAL #3   Title Pt will report no more than 6/10 upper thoracic/cervical back pain in order to indicate improved function.     Baseline has been consistently 7/10 for several visits   Time 4   Period Weeks   Status Partially Met     PT SHORT TERM GOAL #4   Title Will assess DGI and improve score by 2 points in order to indicate decreased fall risk.     Baseline 20/24 on 10/12/16   Time 4   Period Weeks   Status Achieved     PT SHORT TERM GOAL #5   Title Pt will ambulate up to 200' over indoor surfaces w/ SPC at mod I level in order to indicate improved home negotiation.     Baseline met 10/12/16   Time 4   Period Weeks   Status Achieved     PT SHORT TERM GOAL #6   Title Pt will improve TUG to </=15 secs w/ LRAD in order to  indicate decreased fall risk.     Baseline 12.03 secs on 10/12/16   Time 4   Period Weeks  Status Achieved     PT SHORT TERM GOAL #7   Title Pt will improve gait speed to >/=2.62 ft/sec w/ LRAD in order to indicate decreased fall risk and improved efficiency of gait.     Baseline 3.2 ft/sec on 10/12/16   Time 4   Period Weeks   Status Achieved           PT Long Term Goals - 09/17/16 1133      PT LONG TERM GOAL #1   Title Pt will be independent with final HEP in order to indicate improved function and decreased fall risk.  (Target Date: 11/09/16)   Time 8   Period Weeks   Status New     PT LONG TERM GOAL #2   Title Pt will improve DGI by 4 points from baseline in order to indicate decreased fall risk.     Time 8   Period Weeks   Status New     PT LONG TERM GOAL #3   Title Pt will report no more than 4/10 pain in upper thoracic/cervical region in order to indicate improved function.     Time 8   Period Weeks   Status New     PT LONG TERM GOAL #4   Title Pt will improve TUG </=13.5 secs w/ LRAD in order to indicate decreased fall risk.     Time 8   Period Weeks   Status New     PT LONG TERM GOAL #5   Title Pt will ambulate up to 500' over unlevel paved outdoor surfaces w/ LRAD at mod I level in order to indicate safe community negotiation.    Time 8   Period Weeks   Status New     PT LONG TERM GOAL #6   Title Pt will improve ABC FOTO score to >/=50% in order to indicate self reported improved confidence in balance.     Time 8   Period Weeks   Status New     PT LONG TERM GOAL #7   Title Pt will improve cervical ROM (flex/ext/R/Lrotation) by 10 deg in order to indicate improved functional and decreased pain.     Time 8   Period Weeks   Status New               Plan - 11/01/16 1558    Clinical Impression Statement Skilled session focused on high level balance and balance reactions (ankle and esp hip).  Note marked tendency to lose balance posteriorly  and to the R, therefore emphasized correction of this during session.  Discussed continued balance deficits and the need for likely more than one dry needling session, therefore will re-cert for 4 more weeks to address this.     Rehab Potential Good   Clinical Impairments Affecting Rehab Potential co-morbidities   PT Frequency 2x / week   PT Duration 8 weeks   PT Treatment/Interventions ADLs/Self Care Home Management;Electrical Stimulation;Traction;Ultrasound;DME Instruction;Gait training;Stair training;Functional mobility training;Therapeutic activities;Therapeutic exercise;Balance training;Neuromuscular re-education;Patient/family education;Manual techniques;Passive range of motion;Dry needling;Energy conservation;Vestibular;Other (comment)  thoracic grade V manipulations   PT Next Visit Plan Raquel Sarna to recert next week) ankle and hip strategy, muscle energy for lateral cervical musculature (dynamic progressive), kinesthetic awareness with laser,  dry needling, Gait for improved quality, Continue with gentle cervical/thoracic mobilization as able (unable to lie prone due to trach), postural exercises, HEP for postural strength and balance, BLE strength.     Consulted and Agree with Plan of Care Patient;Family member/caregiver  Family Member Consulted Husband [name]      Patient will benefit from skilled therapeutic intervention in order to improve the following deficits and impairments:  Abnormal gait, Decreased activity tolerance, Decreased balance, Decreased endurance, Decreased knowledge of use of DME, Decreased mobility, Decreased range of motion, Decreased safety awareness, Decreased strength, Hypomobility, Impaired perceived functional ability, Impaired flexibility, Impaired sensation, Improper body mechanics, Postural dysfunction, Pain  Visit Diagnosis: Muscle weakness (generalized)  Other abnormalities of gait and mobility  Unsteadiness on feet     Problem List Patient Active Problem  List   Diagnosis Date Noted  . Peripheral neuropathy 08/28/2016  . Diabetic peripheral neuropathy ([place]) 08/28/2016  . Leg weakness, bilateral 08/13/2016  . Gait abnormality 08/13/2016  . Carcinoma of overlapping sites of right breast in female, estrogen receptor negative ([place]) 12/27/2015  . Malignant neoplasm of breast ([place]) 03/28/2015  . 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 Hypercholesterolemia 03/28/2015  . Recurrent major depressive disorder, in partial remission ([place]) 03/01/2015  . 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 Cephalalgia 09/15/2014  . Headache due to trauma 09/15/2014  . Osteoporosis of multiple sites 01/07/2014  . History of colonic polyps 12/14/2013  . History of breast cancer 11/26/2012  . H/O malignant neoplasm of breast 11/26/2012  . 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 Tracheostomy present ([place]) 12/06/2011  . Calcification of bronchus or trachea 01/16/2011  . 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 Anxiety state 12/19/2010  . Blood in sputum 12/19/2010  . HLD (hyperlipidemia) 12/19/2010  . Type 2 diabetes mellitus ([place]) 12/19/2010    Cameron Sprang, PT, MPT [facility] [address] [place], [place], [address] Phone: [phone]   Fax:  [phone] 11/01/16, 4:05 PM  Name: Angela David MRN: [id number] Date of Birth: 25-Mar-1940

## 2016-11-01 NOTE — Patient Instructions (Signed)
Weight Shift: Anterior / Posterior (Righting / Equilibrium)    BEGIN WITH BACK LEANING AGAINST THE WALL AND FEET 4 INCHES AWAY.  Do this standing on pillow that you use for balance exercises and have chair in front of you  Move your hips off the wall and come to upright standing.  Hold for 5 seconds. Return slowly to the wall letting your hips bump the wall and return to stand.   Hold each position _5___ seconds. Repeat _10__ times per session. Do _2__ sessions per day.

## 2016-11-02 ENCOUNTER — Encounter: Payer: Self-pay | Admitting: Rehabilitation

## 2016-11-02 ENCOUNTER — Ambulatory Visit: Payer: Medicare Other | Admitting: Rehabilitation

## 2016-11-02 DIAGNOSIS — M6281 Muscle weakness (generalized): Secondary | ICD-10-CM

## 2016-11-02 DIAGNOSIS — R2681 Unsteadiness on feet: Secondary | ICD-10-CM

## 2016-11-02 DIAGNOSIS — M542 Cervicalgia: Secondary | ICD-10-CM

## 2016-11-02 DIAGNOSIS — R2689 Other abnormalities of gait and mobility: Secondary | ICD-10-CM

## 2016-11-02 NOTE — Therapy (Signed)
[place] [address] [place], [place], [address] Phone: [phone]   Fax:  [phone]  Physical Therapy Treatment  Patient Details  Name: Angela David MRN: [id number] Date of Birth: 11/15/1940 Referring Provider: Margette Fast, MD  Encounter Date: 11/02/2016      PT End of Session - 11/02/16 2018    Visit Number 13   Number of Visits 17   Date for PT Re-Evaluation 11/09/16   Authorization Type MCR G Code on every 10th visit   PT Start Time 1447   PT Stop Time 1530   PT Time Calculation (min) 43 min   Activity Tolerance Patient tolerated treatment well   Behavior During Therapy [facility] for tasks assessed/performed      Past Medical History:  Diagnosis Date  . Breast cancer ([place]) 2002   right breast chemo and radiation  . Cancer [facility]) 2002   right breast  . Diabetic peripheral neuropathy ([place]) 08/28/2016  . Glaucoma   . High cholesterol   . History of colon polyps   . Peripheral neuropathy 08/28/2016  . Personal history of chemotherapy 2002   BREAST CA  . Personal history of malignant neoplasm of breast   . Personal history of radiation therapy 2002   BREAST CA  . Sleep apnea 2011    Past Surgical History:  Procedure Laterality Date  . ABDOMINAL HYSTERECTOMY    . BREAST BIOPSY Right 2008   neg  . BREAST EXCISIONAL BIOPSY Right 2002   positive  . BREAST SURGERY Right 2002   LUMPECTOMY, radiation, chemotherapy  . COLONOSCOPY  2009,12/30/2013  . HYDROCELE EXCISION / REPAIR    . TRACHEAL SURGERY  2013  . TRACHEOSTOMY N/A     There were no vitals filed for this visit.      Subjective Assessment - 11/02/16 1451    Subjective Pt reports no changes since yesterday.    Patient is accompained by: Family member   Limitations House hold activities;Walking;Standing   Patient Stated Goals "I want to walk normal"    Currently in Pain? Yes   Pain Score 6    Pain Location Back   Pain Orientation  Mid;Upper   Pain Descriptors / Indicators Burning   Pain Type Chronic pain   Pain Onset More than a month ago   Pain Frequency Constant   Aggravating Factors  standing, doing too much   Pain Relieving Factors resting                          [facility] Adult PT Treatment/Exercise - 11/02/16 0001      High Level Balance   High Level Balance Comments Went over current HEP for corner balance tasks. Note that they still pose moderate difficulty, so maintained them and performed as seen in pt instruction.       Self-Care   Self-Care Other Self-Care Comments   Posture Continue to educate on sleeping position with pt and spouse.  PT feels that this is biggest source of poor posture, esp L lateral cervical flexion.  Had her attempt to lie on wedge (not blue wedge as desired) to attempt to elevate her head but in R SL position to allow gravity to provide stretch to L scalenes.  They are going to try this in their bed at home.  Note difficulty previously in SL due to trach, however discussed elevating HOB slightly to provide better airway.  Neuro Re-ed    Neuro Re-ed Details  Went over current HEP for corner balance tasks.  Note that they are still appropriately difficult, therefore maintained them.  Performed as in pt instruction.  She continues to demonstrate poor hip and ankle strategy with LOB posteriorly esp with vertical head turns in which she "throws" entire body posterior when looking up.       Neck Exercises: Seated   Other Seated Exercise Performed seated L scalene stretch with use of sheet.  Performed x 2 reps for 30 secs each with added R lateral flexion to increase stretch.  Cues to decrease rotation.  Added to HEP.                  PT Education - 11/02/16 1452    Education provided Yes   Education Details trying to sleep on R side with HOB elevated   Person(s) Educated Patient;Spouse   Methods Explanation   Comprehension Verbalized understanding           PT Short Term Goals - 10/12/16 1458      PT SHORT TERM GOAL #1   Title Pt will be independent with initial HEP in order to indicate improved functional mobility and decreased fall risk.  (Target Date: 10/10/16)   Time 4   Period Weeks   Status New     PT SHORT TERM GOAL #2   Title Will assess cervical ROM and set appropriate LTGs.    Time 4   Period Weeks   Status Achieved     PT SHORT TERM GOAL #3   Title Pt will report no more than 6/10 upper thoracic/cervical back pain in order to indicate improved function.     Baseline has been consistently 7/10 for several visits   Time 4   Period Weeks   Status Partially Met     PT SHORT TERM GOAL #4   Title Will assess DGI and improve score by 2 points in order to indicate decreased fall risk.     Baseline 20/24 on 10/12/16   Time 4   Period Weeks   Status Achieved     PT SHORT TERM GOAL #5   Title Pt will ambulate up to 200' over indoor surfaces w/ SPC at mod I level in order to indicate improved home negotiation.     Baseline met 10/12/16   Time 4   Period Weeks   Status Achieved     PT SHORT TERM GOAL #6   Title Pt will improve TUG to </=15 secs w/ LRAD in order to indicate decreased fall risk.     Baseline 12.03 secs on 10/12/16   Time 4   Period Weeks   Status Achieved     PT SHORT TERM GOAL #7   Title Pt will improve gait speed to >/=2.62 ft/sec w/ LRAD in order to indicate decreased fall risk and improved efficiency of gait.     Baseline 3.2 ft/sec on 10/12/16   Time 4   Period Weeks   Status Achieved           PT Long Term Goals - 09/17/16 1133      PT LONG TERM GOAL #1   Title Pt will be independent with final HEP in order to indicate improved function and decreased fall risk.  (Target Date: 11/09/16)   Time 8   Period Weeks   Status New     PT LONG TERM GOAL #2   Title Pt will improve  DGI by 4 points from baseline in order to indicate decreased fall risk.     Time 8   Period Weeks   Status New      PT LONG TERM GOAL #3   Title Pt will report no more than 4/10 pain in upper thoracic/cervical region in order to indicate improved function.     Time 8   Period Weeks   Status New     PT LONG TERM GOAL #4   Title Pt will improve TUG </=13.5 secs w/ LRAD in order to indicate decreased fall risk.     Time 8   Period Weeks   Status New     PT LONG TERM GOAL #5   Title Pt will ambulate up to 500' over unlevel paved outdoor surfaces w/ LRAD at mod I level in order to indicate safe community negotiation.    Time 8   Period Weeks   Status New     PT LONG TERM GOAL #6   Title Pt will improve ABC FOTO score to >/=50% in order to indicate self reported improved confidence in balance.     Time 8   Period Weeks   Status New     PT LONG TERM GOAL #7   Title Pt will improve cervical ROM (flex/ext/R/Lrotation) by 10 deg in order to indicate improved functional and decreased pain.     Time 8   Period Weeks   Status New               Plan - 11/02/16 2019    Clinical Impression Statement Skilled session focused on current HEP with corner balance tasks.  Maintained all exericses and placed cervical exercises on sheet as well for improved compliance and organization.  Added scalene stretch and provided education to try new sleeping position.     Rehab Potential Good   Clinical Impairments Affecting Rehab Potential co-morbidities   PT Frequency 2x / week   PT Duration 8 weeks   PT Treatment/Interventions ADLs/Self Care Home Management;Electrical Stimulation;Traction;Ultrasound;DME Instruction;Gait training;Stair training;Functional mobility training;Therapeutic activities;Therapeutic exercise;Balance training;Neuromuscular re-education;Patient/family education;Manual techniques;Passive range of motion;Dry needling;Energy conservation;Vestibular;Other (comment)  thoracic grade V manipulations   PT Next Visit Plan Raquel Sarna to recert next week) ankle and hip strategy, muscle energy for lateral  cervical musculature (dynamic progressive), kinesthetic awareness with laser,  dry needling, Gait for improved quality, Continue with gentle cervical/thoracic mobilization as able (unable to lie prone due to trach), postural exercises, HEP for postural strength and balance, BLE strength.     Consulted and Agree with Plan of Care Patient;Family member/caregiver   Family Member Consulted Husband [name]      Patient will benefit from skilled therapeutic intervention in order to improve the following deficits and impairments:  Abnormal gait, Decreased activity tolerance, Decreased balance, Decreased endurance, Decreased knowledge of use of DME, Decreased mobility, Decreased range of motion, Decreased safety awareness, Decreased strength, Hypomobility, Impaired perceived functional ability, Impaired flexibility, Impaired sensation, Improper body mechanics, Postural dysfunction, Pain  Visit Diagnosis: Muscle weakness (generalized)  Other abnormalities of gait and mobility  Unsteadiness on feet  Cervicalgia     Problem List Patient Active Problem List   Diagnosis Date Noted  . Peripheral neuropathy 08/28/2016  . Diabetic peripheral neuropathy ([place]) 08/28/2016  . Leg weakness, bilateral 08/13/2016  . Gait abnormality 08/13/2016  . Carcinoma of overlapping sites of right breast in female, estrogen receptor negative ([place]) 12/27/2015  . Malignant neoplasm of breast ([place]) 03/28/2015  . Latent autoimmune diabetes  mellitus in adults ([place]) 03/28/2015  . Hypercholesterolemia 03/28/2015  . Recurrent major depressive disorder, in partial remission ([place]) 03/01/2015  . 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 Cephalalgia 09/15/2014  . Headache due to trauma 09/15/2014  . Osteoporosis of multiple sites 01/07/2014  . History of colonic polyps 12/14/2013  . History of breast cancer 11/26/2012  . H/O malignant neoplasm of breast 11/26/2012  . 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 Tracheostomy present ([place])  12/06/2011  . Calcification of bronchus or trachea 01/16/2011  . 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 Anxiety state 12/19/2010  . Blood in sputum 12/19/2010  . HLD (hyperlipidemia) 12/19/2010  . Type 2 diabetes mellitus ([place]) 12/19/2010    Cameron Sprang, PT, MPT [facility] [address] [place], [place], [address] Phone: [phone]   Fax:  [phone] 11/02/16, 8:24 PM  Name: Angela David MRN: [id number] Date of Birth: [birth year]

## 2016-11-02 NOTE — Patient Instructions (Addendum)
For these exercises:  Stand in a corner and place a chair in front of you for support if needed.     Feet Together, Head Motion - Eyes Open    With eyes open, feet together, move head slowly: up and down x 10 reps Repeat _1___ times per session. Do _1-2_ sessions per day.  Copyright  VHI. All rights reserved.    Feet Together, Varied Arm Positions - Eyes Closed    Stand with feet together and arms out. Close eyes and visualize upright position. Hold __20__ seconds. Repeat __3__ times per session. Do __1-2__ sessions per day.  Copyright  VHI. All rights reserved.    Feet Apart (Compliant Surface) Head Motion - Eyes Open    With eyes open, standing on compliant surface: __pillow_____, feet shoulder width apart, move head slowly: up and down x 10 reps, side to side x 10 reps.  Repeat _1___ times per session. Do __1-2__ sessions per day.  Copyright  VHI. All rights reserved.   Feet Apart (Compliant Surface) Varied Arm Positions - Eyes Closed    Stand on compliant surface: ___pillow_____ with feet shoulder width apart and arms by your side. Close eyes and visualize upright position. Hold_20___ seconds. Repeat _3___ times per session. Do __1-2__ sessions per day.  Copyright  VHI. All rights reserved.     Scalene stretch with towel  Sit on a sheet or long towel.  Position sheet over opposite shoulder as shown.  As you exhale, pull down on the sheet without slumping.  Continue to hold the sheet down as you inhale.  This will stretch the muscles on the side of your neck.  To increase the stretch, gently tilt your head away from the sheet.  Hold for 20 secs.  Repeat x 2 reps, 2 times per day.    AROM: Lateral Neck Flexion     Slowly tilt head toward Right shoulder, not shoulder towards ear!!   Hold each position __5__ seconds. Repeat _10___ times per set. Do _1___ sets per session. Do __1-2__ sessions per day.  http://orth.exer.us/297   Copyright  VHI.  All rights reserved.    AROM: Neck Rotation    Turn head slowly to look over one shoulder, then the other. Hold each position __5__ seconds. Repeat __10__ times per set. Do __1__ sets per session. Do _1-2___ sessions per day.  http://orth.exer.us/295   Copyright  VHI. All rights reserved.   Healthy Back - Shoulder Roll    DO THIS SITTING!! Sit in a chair with a back and focus on not leaning back!!  Stand straight with arms relaxed at sides. Roll shoulders backward continuously. Do __10__ times. Increase repetitions gradually up to _15___. Do both shoulders at the same time.

## 2016-11-05 ENCOUNTER — Ambulatory Visit: Payer: Medicare Other | Admitting: Rehabilitation

## 2016-11-06 ENCOUNTER — Ambulatory Visit: Payer: Medicare Other | Admitting: Physical Therapy

## 2016-11-06 ENCOUNTER — Encounter: Payer: Self-pay | Admitting: Physical Therapy

## 2016-11-06 DIAGNOSIS — R296 Repeated falls: Secondary | ICD-10-CM

## 2016-11-06 DIAGNOSIS — R2681 Unsteadiness on feet: Secondary | ICD-10-CM

## 2016-11-06 DIAGNOSIS — M6281 Muscle weakness (generalized): Secondary | ICD-10-CM

## 2016-11-06 DIAGNOSIS — R2689 Other abnormalities of gait and mobility: Secondary | ICD-10-CM

## 2016-11-06 DIAGNOSIS — M542 Cervicalgia: Secondary | ICD-10-CM

## 2016-11-06 DIAGNOSIS — M546 Pain in thoracic spine: Secondary | ICD-10-CM

## 2016-11-06 NOTE — Therapy (Signed)
[place], [place], [address] Phone: [phone]   Fax:  [phone]  Physical Therapy Treatment  Patient Details  Name: Angela David MRN: [id number] Date of Birth: December 29, 1940 Referring Provider: Margette Fast, MD  Encounter Date: 11/06/2016      PT End of Session - 11/06/16 1125    Visit Number 14   Number of Visits 17   Date for PT Re-Evaluation 11/09/16   Authorization Type MCR G Code on every 10th visit   PT Start Time 1101   PT Stop Time 1146   PT Time Calculation (min) 45 min   Activity Tolerance Patient tolerated treatment well   Behavior During Therapy [facility] for tasks assessed/performed      Past Medical History:  Diagnosis Date  . Breast cancer ([place]) 2002   right breast chemo and radiation  . Cancer [facility]) 2002   right breast  . Diabetic peripheral neuropathy ([place]) 08/28/2016  . Glaucoma   . High cholesterol   . History of colon polyps   . Peripheral neuropathy 08/28/2016  . Personal history of chemotherapy 2002   BREAST CA  . Personal history of malignant neoplasm of breast   . Personal history of radiation therapy 2002   BREAST CA  . Sleep apnea 2011    Past Surgical History:  Procedure Laterality Date  . ABDOMINAL HYSTERECTOMY    . BREAST BIOPSY Right 2008   neg  . BREAST EXCISIONAL BIOPSY Right 2002   positive  . BREAST SURGERY Right 2002   LUMPECTOMY, radiation, chemotherapy  . COLONOSCOPY  2009,12/30/2013  . HYDROCELE EXCISION / REPAIR    . TRACHEAL SURGERY  2013  . TRACHEOSTOMY N/A     There were no vitals filed for this visit.      Subjective Assessment - 11/06/16 1103    Subjective "Yesterday I was having pain at the base of my neck and between my shoulder blades, and sometimes it will go down to my low back"   Currently in Pain? Yes   Pain Score 6    Pain Location Back   Pain Orientation Mid;Upper   Pain Descriptors / Indicators Burning   Pain Type  Chronic pain   Pain Onset More than a month ago   Pain Frequency Constant   Aggravating Factors  Standing doing dishes, sitting at the desk,    Pain Relieving Factors resting,                          [facility] Adult PT Treatment/Exercise - 11/06/16 1140      Neck Exercises: Seated   Other Seated Exercise seated self 1st rib mobs grade 2-3 with bed sheet  given as HEP     Manual Therapy   Manual Therapy Soft tissue mobilization   Joint Mobilization grade 3 1st rib mobs performed bil with pt breathing in/out deeply for added mob with movement   Soft tissue mobilization IASTM over bil cervicothoracic paraspinals   Myofascial Release fascial rolling/ stretching over bil cervicothoracic           Trigger Point Dry Needling - 11/06/16 1110    Consent Given? Yes   Education Handout Provided Yes   Muscles Treated Upper Body Longissimus   Longissimus Response Twitch response elicited;Palpable increased muscle length  cervical multifidus C5-C7 bil              PT Education - 11/06/16 1125  Education provided Yes   Education Details muscle anatomy and referral patterns. what DN is, benefits/ what to expect and aftercare.    Person(s) Educated Patient   Methods Explanation;Verbal cues   Comprehension Verbalized understanding;Verbal cues required          PT Short Term Goals - 10/12/16 1458      PT SHORT TERM GOAL #1   Title Pt will be independent with initial HEP in order to indicate improved functional mobility and decreased fall risk.  (Target Date: 10/10/16)   Time 4   Period Weeks   Status New     PT SHORT TERM GOAL #2   Title Will assess cervical ROM and set appropriate LTGs.    Time 4   Period Weeks   Status Achieved     PT SHORT TERM GOAL #3   Title Pt will report no more than 6/10 upper thoracic/cervical back pain in order to indicate improved function.     Baseline has been consistently 7/10 for several visits   Time 4   Period Weeks    Status Partially Met     PT SHORT TERM GOAL #4   Title Will assess DGI and improve score by 2 points in order to indicate decreased fall risk.     Baseline 20/24 on 10/12/16   Time 4   Period Weeks   Status Achieved     PT SHORT TERM GOAL #5   Title Pt will ambulate up to 200' over indoor surfaces w/ SPC at mod I level in order to indicate improved home negotiation.     Baseline met 10/12/16   Time 4   Period Weeks   Status Achieved     PT SHORT TERM GOAL #6   Title Pt will improve TUG to </=15 secs w/ LRAD in order to indicate decreased fall risk.     Baseline 12.03 secs on 10/12/16   Time 4   Period Weeks   Status Achieved     PT SHORT TERM GOAL #7   Title Pt will improve gait speed to >/=2.62 ft/sec w/ LRAD in order to indicate decreased fall risk and improved efficiency of gait.     Baseline 3.2 ft/sec on 10/12/16   Time 4   Period Weeks   Status Achieved           PT Long Term Goals - 09/17/16 1133      PT LONG TERM GOAL #1   Title Pt will be independent with final HEP in order to indicate improved function and decreased fall risk.  (Target Date: 11/09/16)   Time 8   Period Weeks   Status New     PT LONG TERM GOAL #2   Title Pt will improve DGI by 4 points from baseline in order to indicate decreased fall risk.     Time 8   Period Weeks   Status New     PT LONG TERM GOAL #3   Title Pt will report no more than 4/10 pain in upper thoracic/cervical region in order to indicate improved function.     Time 8   Period Weeks   Status New     PT LONG TERM GOAL #4   Title Pt will improve TUG </=13.5 secs w/ LRAD in order to indicate decreased fall risk.     Time 8   Period Weeks   Status New     PT LONG TERM GOAL #5   Title Pt will ambulate  up to 500' over unlevel paved outdoor surfaces w/ LRAD at mod I level in order to indicate safe community negotiation.    Time 8   Period Weeks   Status New     PT LONG TERM GOAL #6   Title Pt will improve ABC FOTO score  to >/=50% in order to indicate self reported improved confidence in balance.     Time 8   Period Weeks   Status New     PT LONG TERM GOAL #7   Title Pt will improve cervical ROM (flex/ext/R/Lrotation) by 10 deg in order to indicate improved functional and decreased pain.     Time 8   Period Weeks   Status New               Plan - 11/06/16 1256    Clinical Impression Statement pt reports 6/10 pain which she reports is consistent in the neck. Educated and performed DN in the Cervical paraspinals. Soft tissue work she reported relief of muscle tightness/ pain. mobs were utilized to improve 1st rib mobility bil and educated how to perform at home. post session she reported no pain and declined modalities.    PT Next Visit Plan assess response to DN. Raquel Sarna to recert next week) ankle and hip strategy, muscle energy for lateral cervical musculature (dynamic progressive), kinesthetic awareness with laser,  dry needling, Gait for improved quality, Continue with gentle cervical/thoracic mobilization as able (unable to lie prone due to trach), postural exercises, HEP for postural strength and balance, BLE strength.     PT Home Exercise Plan self 1st rib mob   Consulted and Agree with Plan of Care Patient      Patient will benefit from skilled therapeutic intervention in order to improve the following deficits and impairments:  Abnormal gait, Decreased activity tolerance, Decreased balance, Decreased endurance, Decreased knowledge of use of DME, Decreased mobility, Decreased range of motion, Decreased safety awareness, Decreased strength, Hypomobility, Impaired perceived functional ability, Impaired flexibility, Impaired sensation, Improper body mechanics, Postural dysfunction, Pain  Visit Diagnosis: Muscle weakness (generalized)  Other abnormalities of gait and mobility  Unsteadiness on feet  Cervicalgia  Pain in thoracic spine  Repeated falls     Problem List Patient Active  Problem List   Diagnosis Date Noted  . Peripheral neuropathy 08/28/2016  . Diabetic peripheral neuropathy ([place]) 08/28/2016  . Leg weakness, bilateral 08/13/2016  . Gait abnormality 08/13/2016  . Carcinoma of overlapping sites of right breast in female, estrogen receptor negative ([place]) 12/27/2015  . Malignant neoplasm of breast ([place]) 03/28/2015  . 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 Hypercholesterolemia 03/28/2015  . Recurrent major depressive disorder, in partial remission ([place]) 03/01/2015  . 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 Cephalalgia 09/15/2014  . Headache due to trauma 09/15/2014  . Osteoporosis of multiple sites 01/07/2014  . History of colonic polyps 12/14/2013  . History of breast cancer 11/26/2012  . H/O malignant neoplasm of breast 11/26/2012  . 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 Tracheostomy present ([place]) 12/06/2011  . Calcification of bronchus or trachea 01/16/2011  . 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 Anxiety state 12/19/2010  . Blood in sputum 12/19/2010  . HLD (hyperlipidemia) 12/19/2010  . Type 2 diabetes mellitus ([place]) 12/19/2010   Starr Lake PT, DPT, LAT, ATC  11/06/16  1:02 PM      [facility] [address] [place], [place], [address] Phone: [phone]   Fax:  [phone]  Name: Angela David MRN: [id number] Date of Birth: 12-Apr-1940

## 2016-11-08 ENCOUNTER — Ambulatory Visit: Payer: Medicare Other | Admitting: Rehabilitation

## 2016-11-12 ENCOUNTER — Encounter: Payer: Self-pay | Admitting: Rehabilitation

## 2016-11-12 ENCOUNTER — Ambulatory Visit: Payer: Medicare Other | Admitting: Rehabilitation

## 2016-11-12 DIAGNOSIS — M6281 Muscle weakness (generalized): Secondary | ICD-10-CM | POA: Diagnosis not present

## 2016-11-12 DIAGNOSIS — M542 Cervicalgia: Secondary | ICD-10-CM

## 2016-11-12 DIAGNOSIS — R2681 Unsteadiness on feet: Secondary | ICD-10-CM

## 2016-11-12 DIAGNOSIS — R296 Repeated falls: Secondary | ICD-10-CM

## 2016-11-12 DIAGNOSIS — M546 Pain in thoracic spine: Secondary | ICD-10-CM

## 2016-11-12 DIAGNOSIS — R2689 Other abnormalities of gait and mobility: Secondary | ICD-10-CM

## 2016-11-12 NOTE — Therapy (Signed)
[facility] [address] [place], [place], [address] Phone: [phone]   Fax:  [phone]  Physical Therapy Treatment  Patient Details  Name: Angela David MRN: [id number] Date of Birth: 07-Apr-1940 Referring Provider: Margette Fast, MD  Encounter Date: 11/12/2016      PT End of Session - 11/12/16 1659    Visit Number 15   Number of Visits 25  per updated POC   Date for PT Re-Evaluation 12/12/16  per updated POC   Authorization Type MCR G Code on every 10th visit   PT Start Time 1448   PT Stop Time 1532   PT Time Calculation (min) 44 min   Activity Tolerance Patient tolerated treatment well   Behavior During Therapy [facility] for tasks assessed/performed      Past Medical History:  Diagnosis Date  . Breast cancer ([place]) 2002   right breast chemo and radiation  . Cancer [facility]) 2002   right breast  . Diabetic peripheral neuropathy ([place]) 08/28/2016  . Glaucoma   . High cholesterol   . History of colon polyps   . Peripheral neuropathy 08/28/2016  . Personal history of chemotherapy 2002   BREAST CA  . Personal history of malignant neoplasm of breast   . Personal history of radiation therapy 2002   BREAST CA  . Sleep apnea 2011    Past Surgical History:  Procedure Laterality Date  . ABDOMINAL HYSTERECTOMY    . BREAST BIOPSY Right 2008   neg  . BREAST EXCISIONAL BIOPSY Right 2002   positive  . BREAST SURGERY Right 2002   LUMPECTOMY, radiation, chemotherapy  . COLONOSCOPY  2009,12/30/2013  . HYDROCELE EXCISION / REPAIR    . TRACHEAL SURGERY  2013  . TRACHEOSTOMY N/A     There were no vitals filed for this visit.      Subjective Assessment - 11/12/16 1529    Subjective "The dry needling went SO well, my pain is so much better than it was!"    Patient is accompained by: Family member   Limitations House hold activities;Walking;Standing   Currently in Pain? Yes   Pain Score 4    Pain Location Back    Pain Orientation Mid;Upper   Pain Descriptors / Indicators Burning   Pain Type Chronic pain   Pain Onset More than a month ago   Pain Frequency Constant   Aggravating Factors  standing for prolonged periods of time   Pain Relieving Factors rest, lying down                          [facility] Adult PT Treatment/Exercise - 11/12/16 0001      Self-Care   Self-Care Posture   Posture Went over sleeping positions again today as she states she was able to lie on her R side with HOB elevated slightly to promote L cervical stretch.  Had her demonstrate to me during session to better assess support.  Note that she almost has too much support elevating her into continued L cervical flexion, therefore PT removed large pillow, replaced with thinner pillow and small towel to ensure slight stretch.  She was able to tolerate this well.  Also had her lie on L side but with increased support if needed to increase R lateral cervical flexion.  She also tolerated this well and educated to do either at home to promote good alignment with sleep.       Manual Therapy  Manual Therapy Joint mobilization;Soft tissue mobilization;Myofascial release;Manual Traction   Manual therapy comments Continue to address cervical spine mobility and decreasing pain with improving flexibility.  Pt tolerated very well and did note marked change in flexibility and ROM today.    Joint Mobilization Grade III 1st rib mobilization as able to tolerate in supine with concurrent breathing.  Pt tolerated much better today.  Continue to perform lateral glides to R side of mid to lower cervical spine to open up L facet joints and improve mobility (performed grade II to III during session).      Soft tissue mobilization Soft tissue mobilization with traction to B cervical paraspinals.  Note area of tightness more so on the L today, increased focus here.     Myofascial Release fascial rolling/ stretching over bil cervicothoracic     Manual Traction manual traction with retraction with head off EOM to increase ROM into more extension.  Pt tolerated well.                  PT Education - 11/12/16 1659    Education provided Yes   Education Details Continuing DN at this location with therapist next week [place]).  PT to leave notes for her.    Person(s) Educated Patient;Spouse   Methods Explanation   Comprehension Verbalized understanding          PT Short Term Goals - 11/12/16 1709      PT SHORT TERM GOAL #1   Title =LTGs           PT Long Term Goals - 11/12/16 1703      PT LONG TERM GOAL #1   Title Pt will be independent with final HEP in order to indicate improved function and decreased fall risk.     Time 4   Period Weeks   Status On-going   Target Date 12/12/16     PT LONG TERM GOAL #2   Title Will assess FGA and improve score by 3 points from baseline in order to indicate decreased fall risk.     Time 4   Period Weeks   Status New   Target Date 12/12/16     PT LONG TERM GOAL #3   Title Pt will report no more than 4/10 pain in upper thoracic/cervical region in order to indicate improved function.     Time 4   Period Weeks   Status On-going   Target Date 12/12/16     PT LONG TERM GOAL #4   Title Pt will improve TUG </=13.5 secs w/ LRAD in order to indicate decreased fall risk.     Baseline 12.03 09/2016   Time 8   Period Weeks   Status Achieved     PT LONG TERM GOAL #5   Title Pt will ambulate up to 500' over unlevel paved outdoor surfaces w/ LRAD at mod I level in order to indicate safe community negotiation.    Time 4   Period Weeks   Status On-going   Target Date 12/12/16     PT LONG TERM GOAL #6   Title Pt will improve ABC FOTO score to >/=50% in order to indicate self reported improved confidence in balance.     Time 4   Period Weeks   Status On-going     PT LONG TERM GOAL #7   Title Pt will improve cervical ROM (flex/ext/R/Lrotation) by 10 deg in order to  indicate improved functional and decreased pain.  Time 4   Period Weeks   Status On-going   Target Date 12/12/16               Plan - 11/12/16 1700    Clinical Impression Statement Skilled session addressing cervical ROM, flexibility and pain.  Note marked decrease in pain following DN session last week.  Will continue this with therapist as needed.  Plan to renew pt for 4 more weeks to address balance, pain and ROM deficits.     Rehab Potential Good   Clinical Impairments Affecting Rehab Potential co-morbidities   PT Frequency 2x / week   PT Duration 4 weeks   PT Next Visit Plan Steph, see what you think about her L scalenes, these are just so tight and I am wondering if you can get them with DN, also she did say she was able to get prone but used their support pillow with facial cut out at [facility] st, Also do FGA, as she has met LTG for DGI, ankle and hip strategy, muscle energy for lateral cervical musculature (dynamic progressive), kinesthetic awareness with laser,  dry needling, Gait for improved quality, Continue with gentle cervical/thoracic mobilization as able (unable to lie prone due to trach), postural exercises, HEP for postural strength and balance, BLE strength.     PT Home Exercise Plan self 1st rib mob   Consulted and Agree with Plan of Care Patient   Family Member Consulted Husband [name]      Patient will benefit from skilled therapeutic intervention in order to improve the following deficits and impairments:  Abnormal gait, Decreased activity tolerance, Decreased balance, Decreased endurance, Decreased knowledge of use of DME, Decreased mobility, Decreased range of motion, Decreased safety awareness, Decreased strength, Hypomobility, Impaired perceived functional ability, Impaired flexibility, Impaired sensation, Improper body mechanics, Postural dysfunction, Pain  Visit Diagnosis: Muscle weakness (generalized) - Plan: PT plan of care cert/re-cert  Other  abnormalities of gait and mobility - Plan: PT plan of care cert/re-cert  Unsteadiness on feet - Plan: PT plan of care cert/re-cert  Cervicalgia - Plan: PT plan of care cert/re-cert  Pain in thoracic spine - Plan: PT plan of care cert/re-cert  Repeated falls - Plan: PT plan of care cert/re-cert     Problem List Patient Active Problem List   Diagnosis Date Noted  . Peripheral neuropathy 08/28/2016  . Diabetic peripheral neuropathy ([place]) 08/28/2016  . Leg weakness, bilateral 08/13/2016  . Gait abnormality 08/13/2016  . Carcinoma of overlapping sites of right breast in female, estrogen receptor negative ([place]) 12/27/2015  . Malignant neoplasm of breast ([place]) 03/28/2015  . 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 Hypercholesterolemia 03/28/2015  . Recurrent major depressive disorder, in partial remission ([place]) 03/01/2015  . 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 Cephalalgia 09/15/2014  . Headache due to trauma 09/15/2014  . Osteoporosis of multiple sites 01/07/2014  . History of colonic polyps 12/14/2013  . History of breast cancer 11/26/2012  . H/O malignant neoplasm of breast 11/26/2012  . 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 Tracheostomy present ([place]) 12/06/2011  . Calcification of bronchus or trachea 01/16/2011  . 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 Anxiety state 12/19/2010  . Blood in sputum 12/19/2010  . HLD (hyperlipidemia) 12/19/2010  . Type 2 diabetes mellitus ([place]) 12/19/2010    Cameron Sprang, PT, MPT [facility] [address] [place], [place], [address] Phone: [phone]   Fax:  [phone] 11/12/16, 5:10 PM  Name: Angela David  MRN: [id number] Date of Birth: 03-22-1940

## 2016-11-20 ENCOUNTER — Ambulatory Visit: Payer: Medicare Other | Admitting: Physical Therapy

## 2016-11-20 DIAGNOSIS — M6281 Muscle weakness (generalized): Secondary | ICD-10-CM

## 2016-11-20 DIAGNOSIS — M546 Pain in thoracic spine: Secondary | ICD-10-CM

## 2016-11-20 DIAGNOSIS — R2689 Other abnormalities of gait and mobility: Secondary | ICD-10-CM

## 2016-11-20 DIAGNOSIS — R296 Repeated falls: Secondary | ICD-10-CM

## 2016-11-20 DIAGNOSIS — R2681 Unsteadiness on feet: Secondary | ICD-10-CM

## 2016-11-20 DIAGNOSIS — M542 Cervicalgia: Secondary | ICD-10-CM

## 2016-11-20 NOTE — Therapy (Signed)
[facility] [address] [place], [place], [address] Phone: [phone]   Fax:  [phone]  Physical Therapy Treatment  Patient Details  Name: Angela David MRN: [id number] Date of Birth: [birth year] Referring Provider: Margette Fast, MD  Encounter Date: 11/20/2016      PT End of Session - 11/20/16 1527    Visit Number 16   Number of Visits 25   Date for PT Re-Evaluation 12/12/16   Authorization Type MCR G Code on every 10th visit   PT Start Time 1445   PT Stop Time 1525   PT Time Calculation (min) 40 min   Activity Tolerance Patient tolerated treatment well   Behavior During Therapy [facility] for tasks assessed/performed      Past Medical History:  Diagnosis Date  . Breast cancer ([place]) 2002   right breast chemo and radiation  . Cancer [facility]) 2002   right breast  . Diabetic peripheral neuropathy ([place]) 08/28/2016  . Glaucoma   . High cholesterol   . History of colon polyps   . Peripheral neuropathy 08/28/2016  . Personal history of chemotherapy 2002   BREAST CA  . Personal history of malignant neoplasm of breast   . Personal history of radiation therapy 2002   BREAST CA  . Sleep apnea 2011    Past Surgical History:  Procedure Laterality Date  . ABDOMINAL HYSTERECTOMY    . BREAST BIOPSY Right 2008   neg  . BREAST EXCISIONAL BIOPSY Right 2002   positive  . BREAST SURGERY Right 2002   LUMPECTOMY, radiation, chemotherapy  . COLONOSCOPY  2009,12/30/2013  . HYDROCELE EXCISION / REPAIR    . TRACHEAL SURGERY  2013  . TRACHEOSTOMY N/A     There were no vitals filed for this visit.      Subjective Assessment - 11/20/16 1447    Subjective feels much better; the needling helped so much   Limitations House hold activities;Walking;Standing   Patient Stated Goals "I want to walk normal"    Currently in Pain? Yes   Pain Score 6    Pain Location Back   Pain Orientation Upper;Mid;Right   Pain Descriptors /  Indicators Burning   Pain Type Chronic pain   Pain Onset More than a month ago   Pain Frequency Constant   Aggravating Factors  constant; increased during day and especially with standing   Pain Relieving Factors rest; lying down                         [facility] Adult PT Treatment/Exercise - 11/20/16 1509      Neck Exercises: Theraband   Scapula Retraction 10 reps;Red   Shoulder External Rotation 10 reps;Red   Horizontal ABduction 10 reps;Red     Manual Therapy   Manual Therapy Soft tissue mobilization;Myofascial release   Soft tissue mobilization Rt paraspinals and rhomboids   Myofascial Release Rt paraspinals and rhomboids; pt also performed with tennis ball for home use     Neck Exercises: Stretches   Other Neck Stretches seated mid back stretch with blue physioball to middle and Lt 3x30 sec          Trigger Point Dry Needling - 11/20/16 1508    Consent Given? Yes   Muscles Treated Upper Body Rhomboids;Longissimus  sidelying for both   Rhomboids Response Twitch response elicited;Palpable increased muscle length   Longissimus Response Twitch response elicited;Palpable increased muscle length  PT Education - 11/20/16 1527    Education provided Yes   Education Details verbally reviewed DN with pt   Person(s) Educated Patient;Spouse   Methods Explanation   Comprehension Verbalized understanding          PT Short Term Goals - 11/12/16 1709      PT SHORT TERM GOAL #1   Title =LTGs           PT Long Term Goals - 11/12/16 1703      PT LONG TERM GOAL #1   Title Pt will be independent with final HEP in order to indicate improved function and decreased fall risk.     Time 4   Period Weeks   Status On-going   Target Date 12/12/16     PT LONG TERM GOAL #2   Title Will assess FGA and improve score by 3 points from baseline in order to indicate decreased fall risk.     Time 4   Period Weeks   Status New   Target Date 12/12/16      PT LONG TERM GOAL #3   Title Pt will report no more than 4/10 pain in upper thoracic/cervical region in order to indicate improved function.     Time 4   Period Weeks   Status On-going   Target Date 12/12/16     PT LONG TERM GOAL #4   Title Pt will improve TUG </=13.5 secs w/ LRAD in order to indicate decreased fall risk.     Baseline 12.03 09/2016   Time 8   Period Weeks   Status Achieved     PT LONG TERM GOAL #5   Title Pt will ambulate up to 500' over unlevel paved outdoor surfaces w/ LRAD at mod I level in order to indicate safe community negotiation.    Time 4   Period Weeks   Status On-going   Target Date 12/12/16     PT LONG TERM GOAL #6   Title Pt will improve ABC FOTO score to >/=50% in order to indicate self reported improved confidence in balance.     Time 4   Period Weeks   Status On-going     PT LONG TERM GOAL #7   Title Pt will improve cervical ROM (flex/ext/R/Lrotation) by 10 deg in order to indicate improved functional and decreased pain.     Time 4   Period Weeks   Status On-going   Target Date 12/12/16               Plan - 11/20/16 1528    Clinical Impression Statement Pt tolerated session well today and included DN to thoracic paraspinals and rhomboids with twitch responses and decreased pain following.  Will continue to benefit from PT to maximize function and improve mobility.   PT Treatment/Interventions ADLs/Self Care Home Management;Electrical Stimulation;Traction;Ultrasound;DME Instruction;Gait training;Stair training;Functional mobility training;Therapeutic activities;Therapeutic exercise;Balance training;Neuromuscular re-education;Patient/family education;Manual techniques;Passive range of motion;Dry needling;Energy conservation;Vestibular;Other (comment)   PT Next Visit Plan FGA, as she has met LTG for DGI, ankle and hip strategy, muscle energy for lateral cervical musculature (dynamic progressive), kinesthetic awareness with laser,  dry  needling, Gait for improved quality, Continue with gentle cervical/thoracic mobilization as able (unable to lie prone due to trach), postural exercises, HEP for postural strength and balance, BLE strength.     Consulted and Agree with Plan of Care Patient;Family member/caregiver   Family Member Consulted Husband [name]      Patient will benefit from skilled therapeutic intervention in  order to improve the following deficits and impairments:  Abnormal gait, Decreased activity tolerance, Decreased balance, Decreased endurance, Decreased knowledge of use of DME, Decreased mobility, Decreased range of motion, Decreased safety awareness, Decreased strength, Hypomobility, Impaired perceived functional ability, Impaired flexibility, Impaired sensation, Improper body mechanics, Postural dysfunction, Pain  Visit Diagnosis: Other abnormalities of gait and mobility  Muscle weakness (generalized)  Unsteadiness on feet  Cervicalgia  Pain in thoracic spine  Repeated falls     Problem List Patient Active Problem List   Diagnosis Date Noted  . Peripheral neuropathy 08/28/2016  . Diabetic peripheral neuropathy ([place]) 08/28/2016  . Leg weakness, bilateral 08/13/2016  . Gait abnormality 08/13/2016  . Carcinoma of overlapping sites of right breast in female, estrogen receptor negative ([place]) 12/27/2015  . Malignant neoplasm of breast ([place]) 03/28/2015  . 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 Hypercholesterolemia 03/28/2015  . Recurrent major depressive disorder, in partial remission ([place]) 03/01/2015  . 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 Cephalalgia 09/15/2014  . Headache due to trauma 09/15/2014  . Osteoporosis of multiple sites 01/07/2014  . History of colonic polyps 12/14/2013  . History of breast cancer 11/26/2012  . H/O malignant neoplasm of breast 11/26/2012  . 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 Tracheostomy present ([place]) 12/06/2011  . Calcification of  bronchus or trachea 01/16/2011  . 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 Anxiety state 12/19/2010  . Blood in sputum 12/19/2010  . HLD (hyperlipidemia) 12/19/2010  . Type 2 diabetes mellitus ([place]) 12/19/2010      Laureen Abrahams, PT, DPT 11/20/16 3:31 PM    [facility] [address] [place], [place], [address] Phone: [phone]   Fax:  [phone]  Name: Taryn Shellhammer MRN: [id number] Date of Birth: 1940/08/25

## 2016-11-23 ENCOUNTER — Ambulatory Visit: Payer: Medicare Other | Admitting: Rehabilitation

## 2016-11-23 ENCOUNTER — Encounter: Payer: Self-pay | Admitting: Rehabilitation

## 2016-11-23 DIAGNOSIS — M546 Pain in thoracic spine: Secondary | ICD-10-CM

## 2016-11-23 DIAGNOSIS — R2681 Unsteadiness on feet: Secondary | ICD-10-CM

## 2016-11-23 DIAGNOSIS — R296 Repeated falls: Secondary | ICD-10-CM

## 2016-11-23 DIAGNOSIS — M6281 Muscle weakness (generalized): Secondary | ICD-10-CM | POA: Diagnosis not present

## 2016-11-23 DIAGNOSIS — M542 Cervicalgia: Secondary | ICD-10-CM

## 2016-11-23 DIAGNOSIS — R2689 Other abnormalities of gait and mobility: Secondary | ICD-10-CM

## 2016-11-23 NOTE — Therapy (Signed)
[facility] [address] [place], [place], [address] Phone: [phone]   Fax:  [phone]  Physical Therapy Treatment  Patient Details  Name: Angela David MRN: [id number] Date of Birth: [birth year] Referring Provider: Margette Fast, MD  Encounter Date: 11/23/2016      PT End of Session - 11/23/16 1456    Visit Number 17   Number of Visits 25   Date for PT Re-Evaluation 12/12/16   Authorization Type MCR G Code on every 10th visit   PT Start Time 1448   PT Stop Time 1530   PT Time Calculation (min) 42 min   Activity Tolerance Patient tolerated treatment well   Behavior During Therapy [facility] for tasks assessed/performed      Past Medical History:  Diagnosis Date  . Breast cancer ([place]) 2002   right breast chemo and radiation  . Cancer [facility]) 2002   right breast  . Diabetic peripheral neuropathy ([place]) 08/28/2016  . Glaucoma   . High cholesterol   . History of colon polyps   . Peripheral neuropathy 08/28/2016  . Personal history of chemotherapy 2002   BREAST CA  . Personal history of malignant neoplasm of breast   . Personal history of radiation therapy 2002   BREAST CA  . Sleep apnea 2011    Past Surgical History:  Procedure Laterality Date  . ABDOMINAL HYSTERECTOMY    . BREAST BIOPSY Right 2008   neg  . BREAST EXCISIONAL BIOPSY Right 2002   positive  . BREAST SURGERY Right 2002   LUMPECTOMY, radiation, chemotherapy  . COLONOSCOPY  2009,12/30/2013  . HYDROCELE EXCISION / REPAIR    . TRACHEAL SURGERY  2013  . TRACHEOSTOMY N/A     There were no vitals filed for this visit.      Subjective Assessment - 11/23/16 1453    Subjective Still having pain in mid back, feels better in upper neck   Patient is accompained by: Family member   Limitations House hold activities;Walking;Standing   Patient Stated Goals "I want to walk normal"    Currently in Pain? Yes   Pain Score 7    Pain Location Back   Pain Orientation Mid   Pain Descriptors / Indicators Burning;Aching   Pain Type Chronic pain   Pain Onset More than a month ago   Pain Frequency Constant   Aggravating Factors  constant, increased during day and especially with standing   Pain Relieving Factors rest, lying down            [facility] PT Assessment - 11/23/16 1457      Functional Gait  Assessment   Gait assessed  Yes   Gait Level Surface Walks 20 ft in less than 7 sec but greater than 5.5 sec, uses assistive device, slower speed, mild gait deviations, or deviates 6-10 in outside of the 12 in walkway width.   Change in Gait Speed Able to change speed, demonstrates mild gait deviations, deviates 6-10 in outside of the 12 in walkway width, or no gait deviations, unable to achieve a major change in velocity, or uses a change in velocity, or uses an assistive device.   Gait with Horizontal Head Turns Performs head turns smoothly with no change in gait. Deviates no more than 6 in outside 12 in walkway width   Gait with Vertical Head Turns Performs task with slight change in gait velocity (eg, minor disruption to smooth gait path), deviates 6 - 10 in outside 12 in  walkway width or uses assistive device   Gait and Pivot Turn Pivot turns safely within 3 sec and stops quickly with no loss of balance.   Step Over Obstacle Is able to step over one shoe box (4.5 in total height) but must slow down and adjust steps to clear box safely. May require verbal cueing.   Gait with Narrow Base of Support Ambulates less than 4 steps heel to toe or cannot perform without assistance.   Gait with Eyes Closed Walks 20 ft, slow speed, abnormal gait pattern, evidence for imbalance, deviates 10-15 in outside 12 in walkway width. Requires more than 9 sec to ambulate 20 ft.   Ambulating Backwards Walks 20 ft, slow speed, abnormal gait pattern, evidence for imbalance, deviates 10-15 in outside 12 in walkway width.   Steps Alternating feet, must use rail.   Total  Score 17   FGA comment: < 19 = high risk fall                     [facility] Adult PT Treatment/Exercise - 11/23/16 0001      Self-Care   Self-Care Posture   Posture Continue to discuss posture as it relates to her balance and kinesthetic awareness of midline.  Pt verbalized understanding.      Neuro Re-ed    Neuro Re-ed Details  Tandem walking forwards along counter top x 4 reps with cues for slower speed and decreasing UE support to challenge herself at home, as she is fully supporting herself and task is very easy.  Performed corner balance standing on pillows feet apart EC x 2 reps of 30 secs, feet together EC x 3 reps of 20 secs.  Note marked improvement from last assessment of these exercises, however still requires cues for improved forward weight shift and to self correct L lateral cervical flexion.       Manual Therapy   Manual Therapy Myofascial release;Soft tissue mobilization;Joint mobilization   Joint Mobilization Grade III mobs in lateral glide motion to R side of cerivcal spine to improve L spinal mobility.  pt tolerated well and note marked improvement in ability to laterally flex to the R vs previous sessions.     Soft tissue mobilization Soft tissue mobilization to R and L thoracic paraspinals and rhomboid to improve flexibility.     Myofascial Release R and L thoracic paraspinal and rhomboid trigger point release as pt able to tolerate.                  PT Education - 11/23/16 1456    Education provided Yes   Education Details see self care   Person(s) Educated Patient   Methods Explanation   Comprehension Verbalized understanding          PT Short Term Goals - 11/12/16 1709      PT SHORT TERM GOAL #1   Title =LTGs           PT Long Term Goals - 11/12/16 1703      PT LONG TERM GOAL #1   Title Pt will be independent with final HEP in order to indicate improved function and decreased fall risk.     Time 4   Period Weeks   Status  On-going   Target Date 12/12/16     PT LONG TERM GOAL #2   Title Will assess FGA and improve score by 3 points from baseline in order to indicate decreased fall risk.     Time  4   Period Weeks   Status New   Target Date 12/12/16     PT LONG TERM GOAL #3   Title Pt will report no more than 4/10 pain in upper thoracic/cervical region in order to indicate improved function.     Time 4   Period Weeks   Status On-going   Target Date 12/12/16     PT LONG TERM GOAL #4   Title Pt will improve TUG </=13.5 secs w/ LRAD in order to indicate decreased fall risk.     Baseline 12.03 09/2016   Time 8   Period Weeks   Status Achieved     PT LONG TERM GOAL #5   Title Pt will ambulate up to 500' over unlevel paved outdoor surfaces w/ LRAD at mod I level in order to indicate safe community negotiation.    Time 4   Period Weeks   Status On-going   Target Date 12/12/16     PT LONG TERM GOAL #6   Title Pt will improve ABC FOTO score to >/=50% in order to indicate self reported improved confidence in balance.     Time 4   Period Weeks   Status On-going     PT LONG TERM GOAL #7   Title Pt will improve cervical ROM (flex/ext/R/Lrotation) by 10 deg in order to indicate improved functional and decreased pain.     Time 4   Period Weeks   Status On-going   Target Date 12/12/16               Plan - 11/23/16 1456    Clinical Impression Statement Skilled session focused on assessment of more challenging dynamic balance with FGA.  Note score of 17/30 indicative of increased fall risk, updated goal as needed.  Also continue to focus on high level balance and correction of posture during these tasks as well as manual therapy to cervical spine to provide stretch to L side of neck.  Pt tolerated well.    PT Treatment/Interventions ADLs/Self Care Home Management;Electrical Stimulation;Traction;Ultrasound;DME Instruction;Gait training;Stair training;Functional mobility training;Therapeutic  activities;Therapeutic exercise;Balance training;Neuromuscular re-education;Patient/family education;Manual techniques;Passive range of motion;Dry needling;Energy conservation;Vestibular;Other (comment)   PT Next Visit Plan Emilee Hero is still having a lot of pain at mid thoracic level-told her you may have to dry needle that area again due to chronicity of tightness,  ankle and hip strategy, muscle energy for lateral cervical musculature (dynamic progressive), kinesthetic awareness with laser,  dry needling, Gait for improved quality, Continue with gentle cervical/thoracic mobilization as able (unable to lie prone due to trach), postural exercises, HEP for postural strength and balance, BLE strength.     Consulted and Agree with Plan of Care Patient;Family member/caregiver   Family Member Consulted Husband [name]      Patient will benefit from skilled therapeutic intervention in order to improve the following deficits and impairments:  Abnormal gait, Decreased activity tolerance, Decreased balance, Decreased endurance, Decreased knowledge of use of DME, Decreased mobility, Decreased range of motion, Decreased safety awareness, Decreased strength, Hypomobility, Impaired perceived functional ability, Impaired flexibility, Impaired sensation, Improper body mechanics, Postural dysfunction, Pain  Visit Diagnosis: Other abnormalities of gait and mobility  Muscle weakness (generalized)  Unsteadiness on feet  Cervicalgia  Pain in thoracic spine  Repeated falls     Problem List Patient Active Problem List   Diagnosis Date Noted  . Peripheral neuropathy 08/28/2016  . Diabetic peripheral neuropathy ([place]) 08/28/2016  . Leg weakness, bilateral 08/13/2016  . Gait abnormality 08/13/2016  .  Carcinoma of overlapping sites of right breast in female, estrogen receptor negative ([place]) 12/27/2015  . Malignant neoplasm of breast ([place]) 03/28/2015  . 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 Hypercholesterolemia 03/28/2015  . Recurrent major depressive disorder, in partial remission ([place]) 03/01/2015  . 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 Cephalalgia 09/15/2014  . Headache due to trauma 09/15/2014  . Osteoporosis of multiple sites 01/07/2014  . History of colonic polyps 12/14/2013  . History of breast cancer 11/26/2012  . H/O malignant neoplasm of breast 11/26/2012  . 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 Tracheostomy present ([place]) 12/06/2011  . Calcification of bronchus or trachea 01/16/2011  . 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 Anxiety state 12/19/2010  . Blood in sputum 12/19/2010  . HLD (hyperlipidemia) 12/19/2010  . Type 2 diabetes mellitus ([place]) 12/19/2010    Cameron Sprang, PT, MPT [facility] [address] [place], [place], [address] Phone: [phone]   Fax:  [phone] 11/23/16, 4:45 PM  Name: Siona Coulston MRN: [id number] Date of Birth: 03-22-1940

## 2016-11-27 ENCOUNTER — Ambulatory Visit: Payer: Medicare Other | Admitting: Physical Therapy

## 2016-11-29 ENCOUNTER — Ambulatory Visit: Payer: Medicare Other | Admitting: Physical Therapy

## 2016-12-17 ENCOUNTER — Ambulatory Visit
Admission: RE | Admit: 2016-12-17 | Discharge: 2016-12-17 | Disposition: A | Discharge status: Home / Self Care | Payer: Medicare Other | Source: Ambulatory Visit | Attending: General Surgery | Admitting: General Surgery

## 2016-12-17 DIAGNOSIS — Z1231 Encounter for screening mammogram for malignant neoplasm of breast: Secondary | ICD-10-CM | POA: Insufficient documentation

## 2016-12-25 ENCOUNTER — Ambulatory Visit (INDEPENDENT_AMBULATORY_CARE_PROVIDER_SITE_OTHER): Payer: Medicare Other | Admitting: General Surgery

## 2016-12-25 ENCOUNTER — Encounter: Payer: Self-pay | Admitting: General Surgery

## 2016-12-25 VITALS — BP 98/54 | HR 80 | Resp 16 | Ht 66.0 in | Wt 147.0 lb

## 2016-12-25 DIAGNOSIS — Z853 Personal history of malignant neoplasm of breast: Secondary | ICD-10-CM | POA: Diagnosis not present

## 2016-12-25 NOTE — Progress Notes (Signed)
Patient ID: Angela David, female   DOB: 1940-06-10, 76 y.o.   MRN: [id number]  Chief Complaint  Patient presents with  . Follow-up    HPI Angela David is a 76 y.o. female who presents for a breast cancer follow up and breast evaluation. The most recent mammogram was done on 12/17/2016.  Patient does perform regular self breast checks and gets regular mammograms done.   Patient states she has been having some mild soreness at her right rib cage.   HPI  Past Medical History:  Diagnosis Date  . Breast cancer ([place]) 2002   right breast chemo and radiation  . Cancer [facility]) 2002   right breast  . 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 Glaucoma   . High cholesterol   . History of colon polyps   . Peripheral neuropathy 08/28/2016  . Personal history of chemotherapy 2002   BREAST CA  . Personal history of malignant neoplasm of breast   . Personal history of radiation therapy 2002   BREAST CA  . Sleep apnea 2011    Past Surgical History:  Procedure Laterality Date  . ABDOMINAL HYSTERECTOMY    . BREAST BIOPSY Right 2008   neg  . BREAST EXCISIONAL BIOPSY Right 2002   positive  . BREAST LUMPECTOMY Right 2002   chemo and radiation  . BREAST SURGERY Right 2002   LUMPECTOMY, radiation, chemotherapy  . COLONOSCOPY  2009,12/30/2013  . HYDROCELE EXCISION / REPAIR    . TRACHEAL SURGERY  2013  . TRACHEOSTOMY N/A     Family History  Problem Relation Age of Onset  . Cancer Father        bone cancer  . Cancer Sister   . Cancer Brother   . Breast cancer Neg Hx     Social History Social History  Substance Use Topics  . Smoking status: Never Smoker  . Smokeless tobacco: Never Used  . Alcohol use No    Allergies  Allergen Reactions  . Codeine     Per patient "it kept her up all night"  . Eggs Or Egg-Derived Products     respitatory distress   . Shellfish Allergy     respitatory distress    . Sodium Sulfite Other (See Comments)    Current  Outpatient Prescriptions  Medication Sig Dispense Refill  . Biotin 1000 MCG tablet Take by mouth.    . busPIRone (BUSPAR) 15 MG tablet Take 1 tablet by mouth 4 (four) times daily.    . Calcium Carbonate-Vitamin D3 (CALCIUM 600-D) 600-400 MG-UNIT TABS Take 1 tablet by mouth daily.     . cetirizine (ZYRTEC) 10 MG tablet Take 10 mg by mouth.    . escitalopram (LEXAPRO) 20 MG tablet Take 20 mg by mouth.    . ferrous sulfate 325 (65 FE) MG tablet Take 325 mg by mouth daily.    . folic acid (FOLVITE) 1 MG tablet Take 1 mg by mouth daily.    Marland Kitchen gabapentin (NEURONTIN) 400 MG capsule Take 800 mg by mouth 2 (two) times daily.    . hydrochlorothiazide (HYDRODIURIL) 25 MG tablet Take 25 mg by mouth daily.     . metFORMIN (GLUCOPHAGE) 1000 MG tablet 1 tab by mouth in AM 1.5 tab by mouth in PM    . metoprolol tartrate (LOPRESSOR) 25 MG tablet Take 1 tablet by mouth 2 (two) times daily.    . NON FORMULARY Take 2 capsules by mouth daily. CereVen - dietary supplement - 2 cap  per day. With NON GMO-soy products.    . NON FORMULARY Take 3 capsules by mouth daily. Bili Ven dietary supplement for [employer] Detoxification - takes 3 capsules per day (1 cap in am, 1 at noon and 1 at dinner).    . NON FORMULARY Take 3 capsules by mouth daily. Pancre Ven supplement to control blood glucose. Pt takes 3 capsules daily.    . NON FORMULARY Take 6 capsules by mouth daily. Glysen K-1 (dietary supplement use as a multivitamin). Patient takes 2 capsules in am  And 2 capsules at noon and 2 capsules at bedtime.    . NON FORMULARY Take 1 capsule by mouth. Immuno Ven- (cardiovascular supplement). Patient takes 1 capsule per day.    . NONFORMULARY OR COMPOUNDED ITEM Take 1 oz by mouth 3 (three) times daily. Black Jerry Juice mixed with 1 ounce of water. Patient takes three times a day (am, noon and bedtime)    . NONFORMULARY OR COMPOUNDED ITEM Take 5 mLs by mouth 3 (three) times daily. "Compounded Vitamin liquid Supplement to be mixed  with 1/4 cup of water) three times a day to help with gallbladder detoxifcation. Provided by chiropractor's office.    . pantoprazole (PROTONIX) 40 MG tablet Take 40 mg by mouth daily.    . pioglitazone (ACTOS) 15 MG tablet Take 15 mg by mouth daily.     . RESTASIS 0.05 % ophthalmic emulsion     . vitamin E 400 UNIT capsule Take 400 Units by mouth daily.      No current facility-administered medications for this visit.     Review of Systems Review of Systems  Constitutional: Negative.   Respiratory: Negative.   Cardiovascular: Negative.     Blood pressure (!) 98/54, pulse 80, resp. rate 16, height 5\' 6"  (1.676 m), weight 147 lb (66.7 kg).  Physical Exam Physical Exam  Constitutional: She is oriented to person, place, and time. She appears well-developed and well-nourished.  Eyes: Conjunctivae are normal. No scleral icterus.  Neck: Neck supple.  Tracheostomy opening noted  Cardiovascular: Normal rate, regular rhythm and normal heart sounds.   Pulmonary/Chest: Effort normal and breath sounds normal. Tenderness: Mild tendereness at costochondral junction on right. Right breast exhibits no inverted nipple, no mass, no nipple discharge, no skin change and no tenderness. Left breast exhibits no inverted nipple, no mass, no nipple discharge, no skin change and no tenderness.  Lymphadenopathy:    She has no cervical adenopathy.    She has no axillary adenopathy.  Neurological: She is alert and oriented to person, place, and time.  Skin: Skin is warm and dry.    Data Reviewed Prior notes and mammogram reviewed  Mammogram revealed no evidence of malignancy. Stable with scattered areas of fibroglandular density.  Assessment    History of right breast cancer. Stable without signs of recurrence.  Mild tenderness along right costochondral area, likely costochondritis. Educated pt that this is usually self limited.    Plan       CA 27-29 Patient to return in one year bilateral  screening mammogram with DR. Byrnett. The patient is aware to call back for any questions or concerns.  HPI, Physical Exam, Assessment and Plan have been scribed under the direction and in the presence of Mckinley Jewel, MD  Gaspar Cola, CMA I have completed the exam and reviewed the above documentation for accuracy and completeness.  I agree with the above.  Haematologist has been used and any errors in dictation  or transcription are unintentional.  Bonne Whack G. Jamal Collin, M.D., F.A.C.S.    Junie Panning G 12/26/2016, 11:22 AM

## 2016-12-25 NOTE — Patient Instructions (Signed)
Patient to return in one year bilateral screening mammogram with DR. Byrnett. The patient is aware to call back for any questions or concerns.

## 2017-01-05 LAB — CANCER ANTIGEN 27.29: CA 27.29: 29.5 U/mL (ref 0.0–38.6)

## 2017-01-05 IMAGING — MG MM SCREENING BREAST TOMO BILATERAL
9 of 14 series · 9 of 30 positions shown · non-contrast
Comparison: Previous exam(s).

CLINICAL DATA: Screening.

EXAM:
DIGITAL SCREENING BILATERAL MAMMOGRAM WITH 3D TOMO WITH CAD

[R CC (1 of 3)]
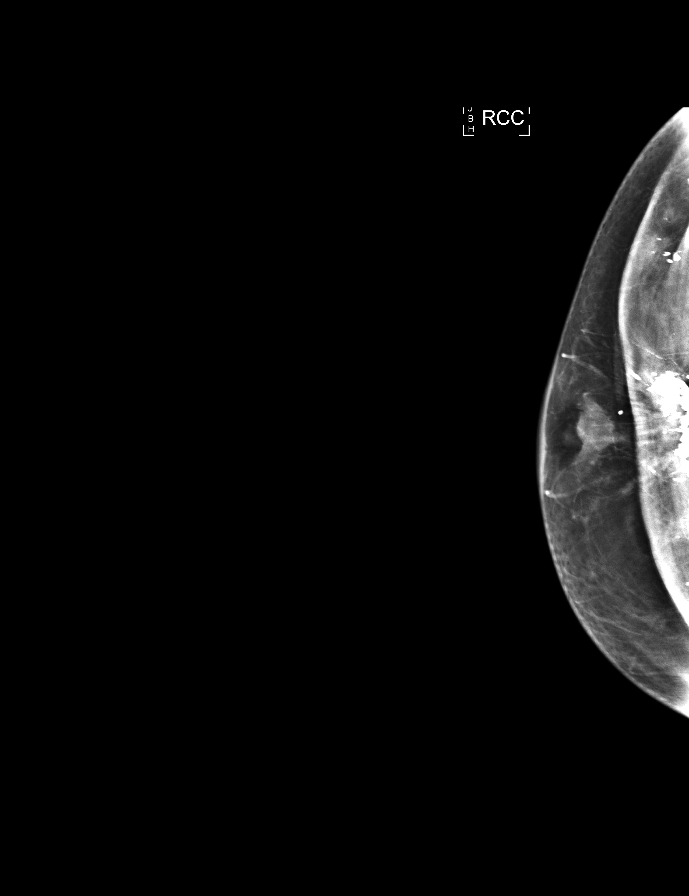

[R CC (2 of 3)]
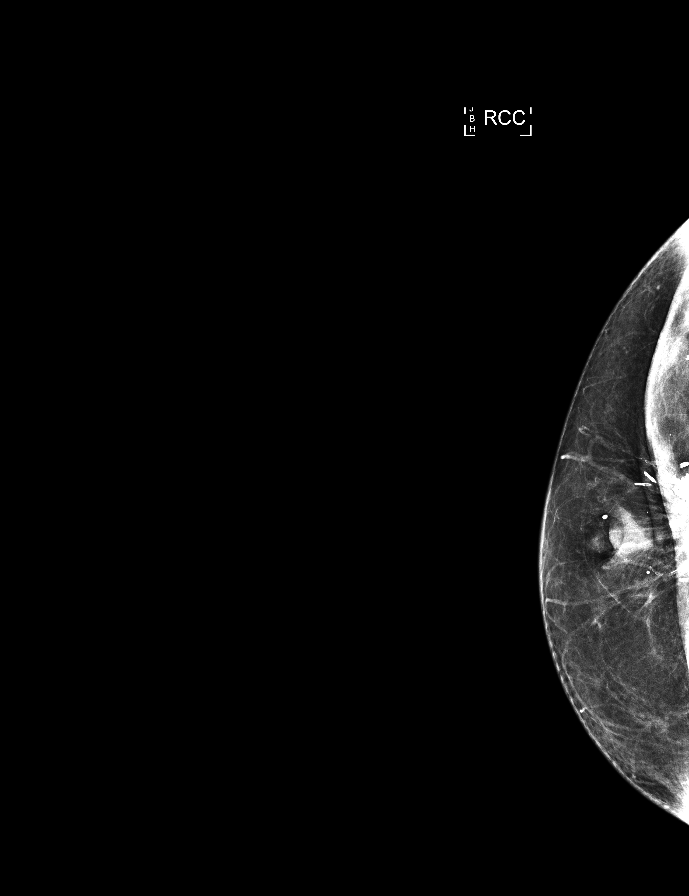

[R MLO synth-2D]
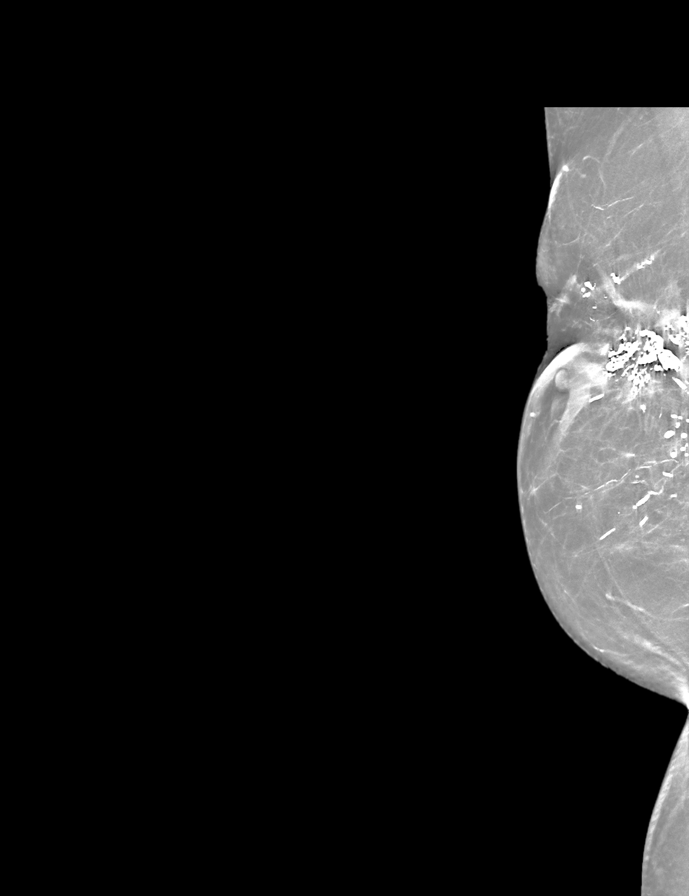

[L MLO]
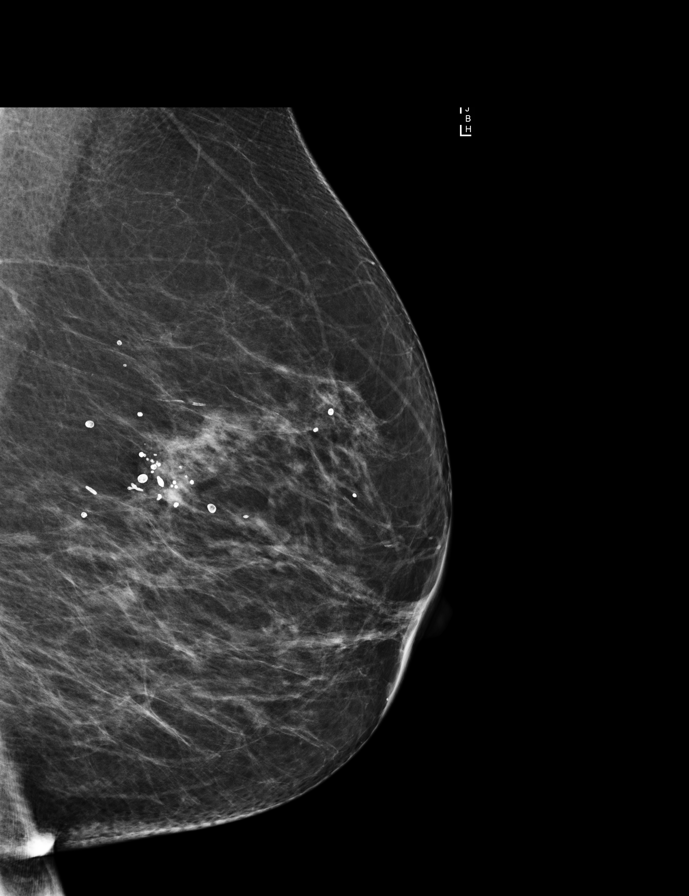

[R CC synth-2D]
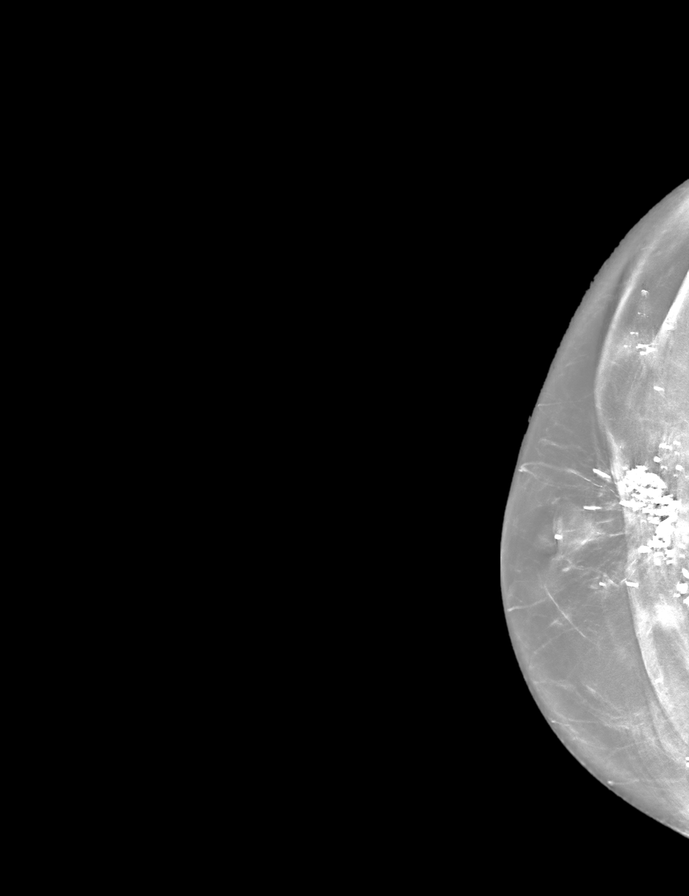

[L CC synth-2D]
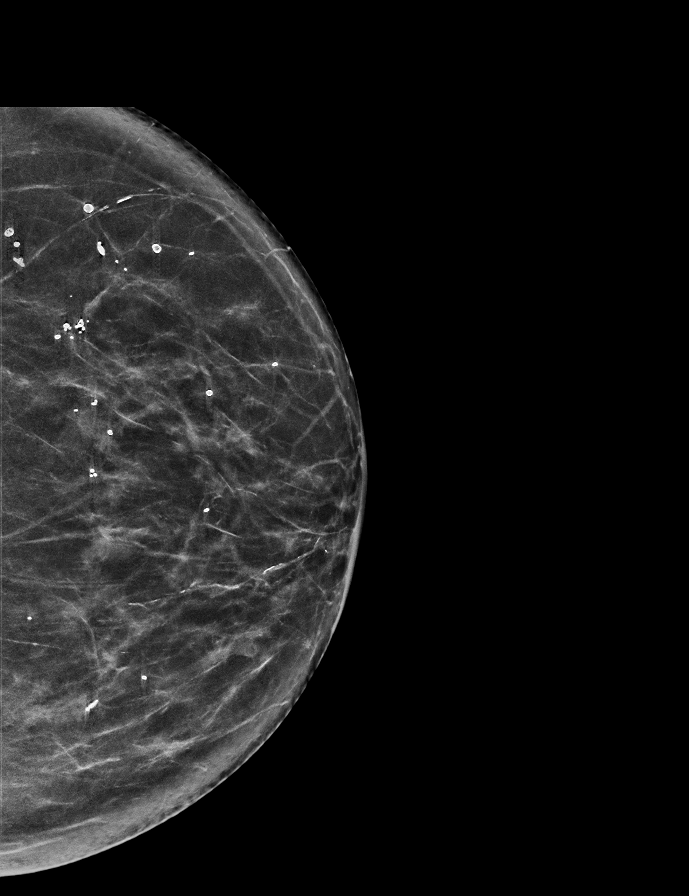

[L CC]
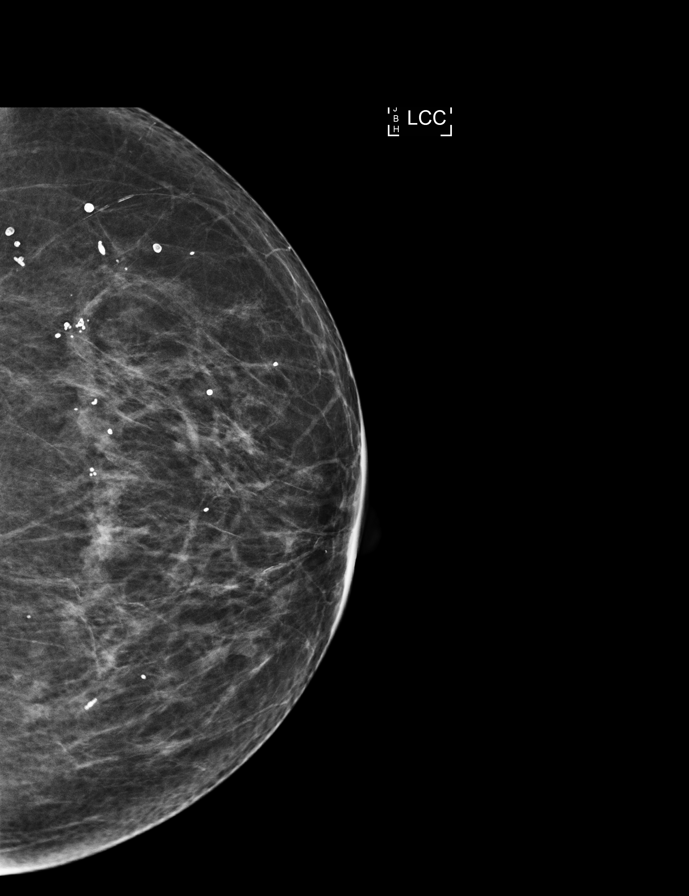

[R CC (3 of 3)]
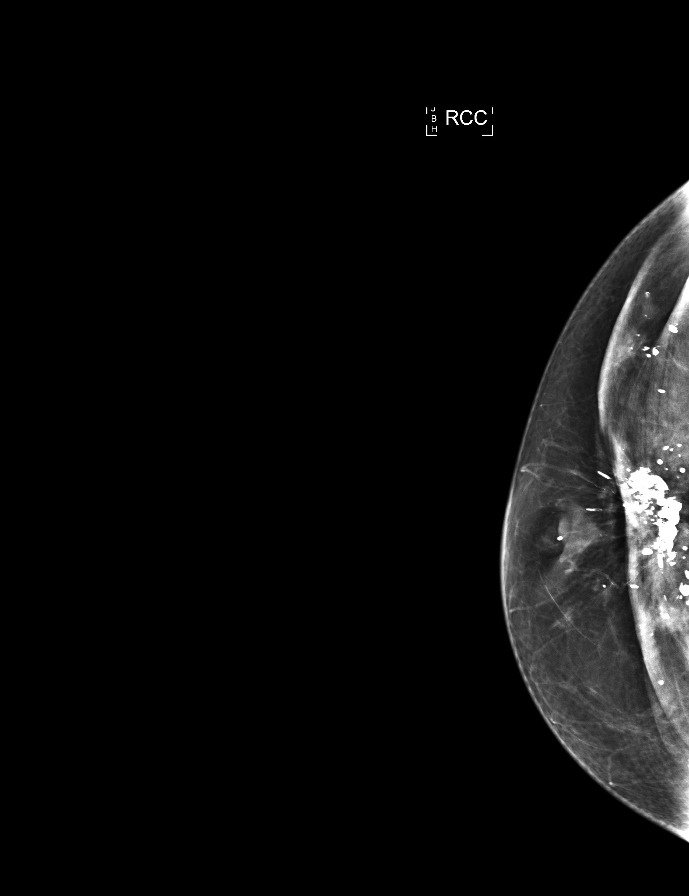

[L MLO synth-2D]
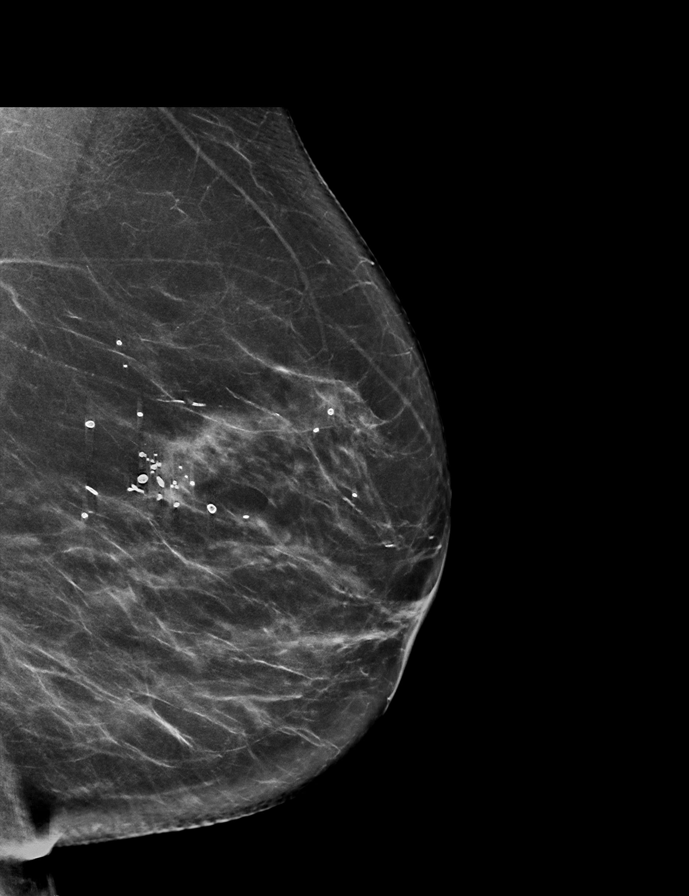

[9 of 30 positions shown; findings below may reference images not displayed]

ACR Breast Density Category c: The breast tissue is heterogeneously
dense, which may obscure small masses.
FINDINGS: There are no findings suspicious for malignancy. Images were
processed with CAD.
IMPRESSION: No mammographic evidence of malignancy. A result letter of this
screening mammogram will be mailed directly to the patient.

RECOMMENDATION:
Screening mammogram in one year. (Code:OA-G-1SS)

BI-RADS CATEGORY  1: Negative.

## 2017-01-07 ENCOUNTER — Telehealth: Payer: Self-pay | Admitting: *Deleted

## 2017-01-07 NOTE — Telephone Encounter (Signed)
Patient contacted today and notified of lab results. She verbalizes understanding.

## 2017-01-07 NOTE — Telephone Encounter (Signed)
-----   Message from Christene Lye, MD sent at 01/05/2017  9:37 AM EST ----- Please let pt know, normal lab

## 2017-01-15 ENCOUNTER — Inpatient Hospital Stay: Payer: Medicare Other | Attending: Internal Medicine

## 2017-01-15 DIAGNOSIS — Z79899 Other long term (current) drug therapy: Secondary | ICD-10-CM | POA: Diagnosis not present

## 2017-01-15 DIAGNOSIS — Z7984 Long term (current) use of oral hypoglycemic drugs: Secondary | ICD-10-CM | POA: Insufficient documentation

## 2017-01-15 DIAGNOSIS — C50811 Malignant neoplasm of overlapping sites of right female breast: Secondary | ICD-10-CM

## 2017-01-15 DIAGNOSIS — Z86010 Personal history of colonic polyps: Secondary | ICD-10-CM | POA: Diagnosis not present

## 2017-01-15 DIAGNOSIS — E7800 Pure hypercholesterolemia, unspecified: Secondary | ICD-10-CM | POA: Insufficient documentation

## 2017-01-15 DIAGNOSIS — Z923 Personal history of irradiation: Secondary | ICD-10-CM | POA: Insufficient documentation

## 2017-01-15 DIAGNOSIS — Z853 Personal history of malignant neoplasm of breast: Secondary | ICD-10-CM | POA: Diagnosis present

## 2017-01-15 DIAGNOSIS — D473 Essential (hemorrhagic) thrombocythemia: Secondary | ICD-10-CM | POA: Insufficient documentation

## 2017-01-15 DIAGNOSIS — G4730 Sleep apnea, unspecified: Secondary | ICD-10-CM | POA: Insufficient documentation

## 2017-01-15 DIAGNOSIS — E1142 Type 2 diabetes mellitus with diabetic polyneuropathy: Secondary | ICD-10-CM | POA: Insufficient documentation

## 2017-01-15 DIAGNOSIS — Z9221 Personal history of antineoplastic chemotherapy: Secondary | ICD-10-CM | POA: Insufficient documentation

## 2017-01-15 DIAGNOSIS — G629 Polyneuropathy, unspecified: Secondary | ICD-10-CM | POA: Diagnosis not present

## 2017-01-15 DIAGNOSIS — M8580 Other specified disorders of bone density and structure, unspecified site: Secondary | ICD-10-CM | POA: Diagnosis not present

## 2017-01-15 DIAGNOSIS — Z809 Family history of malignant neoplasm, unspecified: Secondary | ICD-10-CM | POA: Diagnosis not present

## 2017-01-15 DIAGNOSIS — Z171 Estrogen receptor negative status [ER-]: Secondary | ICD-10-CM

## 2017-01-15 LAB — CBC WITH DIFFERENTIAL/PLATELET
BASOS PCT: 1 %
Basophils Absolute: 0.1 10*3/uL (ref 0–0.1)
EOS ABS: 0.1 10*3/uL (ref 0–0.7)
EOS PCT: 1 %
HCT: 40.8 % (ref 35.0–47.0)
HEMOGLOBIN: 13.8 g/dL (ref 12.0–16.0)
Lymphocytes Relative: 20 %
Lymphs Abs: 1.7 10*3/uL (ref 1.0–3.6)
MCH: 28.9 pg (ref 26.0–34.0)
MCHC: 33.8 g/dL (ref 32.0–36.0)
MCV: 85.5 fL (ref 80.0–100.0)
MONOS PCT: 6 %
Monocytes Absolute: 0.5 10*3/uL (ref 0.2–0.9)
NEUTROS PCT: 72 %
Neutro Abs: 6 10*3/uL (ref 1.4–6.5)
PLATELETS: 193 10*3/uL (ref 150–440)
RBC: 4.78 MIL/uL (ref 3.80–5.20)
RDW: 12.5 % (ref 11.5–14.5)
WBC: 8.4 10*3/uL (ref 3.6–11.0)

## 2017-01-16 LAB — CEA: CEA: 10.1 ng/mL — ABNORMAL HIGH (ref 0.0–4.7)

## 2017-01-22 ENCOUNTER — Other Ambulatory Visit: Payer: Self-pay

## 2017-01-22 ENCOUNTER — Inpatient Hospital Stay: Payer: Medicare Other

## 2017-01-22 ENCOUNTER — Inpatient Hospital Stay (HOSPITAL_BASED_OUTPATIENT_CLINIC_OR_DEPARTMENT_OTHER): Payer: Medicare Other | Admitting: Internal Medicine

## 2017-01-22 ENCOUNTER — Encounter: Payer: Self-pay | Admitting: Internal Medicine

## 2017-01-22 VITALS — BP 94/65 | HR 64 | Temp 97.8°F | Resp 20 | Ht 66.0 in | Wt 154.0 lb

## 2017-01-22 DIAGNOSIS — M810 Age-related osteoporosis without current pathological fracture: Secondary | ICD-10-CM

## 2017-01-22 DIAGNOSIS — D473 Essential (hemorrhagic) thrombocythemia: Secondary | ICD-10-CM

## 2017-01-22 DIAGNOSIS — E7800 Pure hypercholesterolemia, unspecified: Secondary | ICD-10-CM

## 2017-01-22 DIAGNOSIS — M8580 Other specified disorders of bone density and structure, unspecified site: Secondary | ICD-10-CM | POA: Diagnosis not present

## 2017-01-22 DIAGNOSIS — E1142 Type 2 diabetes mellitus with diabetic polyneuropathy: Secondary | ICD-10-CM

## 2017-01-22 DIAGNOSIS — Z853 Personal history of malignant neoplasm of breast: Secondary | ICD-10-CM

## 2017-01-22 DIAGNOSIS — G4730 Sleep apnea, unspecified: Secondary | ICD-10-CM

## 2017-01-22 DIAGNOSIS — Z171 Estrogen receptor negative status [ER-]: Secondary | ICD-10-CM

## 2017-01-22 DIAGNOSIS — G629 Polyneuropathy, unspecified: Secondary | ICD-10-CM

## 2017-01-22 DIAGNOSIS — Z79899 Other long term (current) drug therapy: Secondary | ICD-10-CM

## 2017-01-22 DIAGNOSIS — Z9221 Personal history of antineoplastic chemotherapy: Secondary | ICD-10-CM

## 2017-01-22 DIAGNOSIS — Z86010 Personal history of colonic polyps: Secondary | ICD-10-CM

## 2017-01-22 DIAGNOSIS — C50811 Malignant neoplasm of overlapping sites of right female breast: Secondary | ICD-10-CM

## 2017-01-22 DIAGNOSIS — Z7984 Long term (current) use of oral hypoglycemic drugs: Secondary | ICD-10-CM

## 2017-01-22 DIAGNOSIS — Z923 Personal history of irradiation: Secondary | ICD-10-CM

## 2017-01-22 DIAGNOSIS — Z809 Family history of malignant neoplasm, unspecified: Secondary | ICD-10-CM

## 2017-01-22 LAB — CALCIUM: CALCIUM: 9.5 mg/dL (ref 8.9–10.3)

## 2017-01-22 LAB — CREATININE, SERUM
CREATININE: 1.05 mg/dL — AB (ref 0.44–1.00)
GFR calc Af Amer: 59 mL/min — ABNORMAL LOW (ref >60)
GFR, EST NON AFRICAN AMERICAN: 51 mL/min — AB (ref >60)

## 2017-01-22 LAB — BUN: BUN: 19 mg/dL (ref 6–20)

## 2017-01-22 MED ORDER — DENOSUMAB 60 MG/ML ~~LOC~~ SOLN
60.0000 mg | Freq: Once | SUBCUTANEOUS | Status: AC
  Administered 2017-01-22: 60 mg via SUBCUTANEOUS

## 2017-01-22 NOTE — Assessment & Plan Note (Addendum)
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 Clinically no evidence of recurrence. Last mammogram October 2018 within normal limits.  # Elevated Tumor Marker- likely secondary to recurrent malignancy. Today's CEA is 10/fairly stable over the last 1 year. No plans for any scans at this time.  # Osteopenia- Dexa Scan 2016- T-score -1.4. Continue Prolia; due today. Calcium within normal limits. Patient calcium and vitamin D. BMD- prior to next visit.   # Peripheral neuropathy-Dr.Naylor s/p acupuncture; improved. On neurontin 400 BID.   # follow up in 6 months with labs (labs a few days prior CBC, CMET, CEA, [id number]) and MD and Prolia injection. BMD prior.   

## 2017-01-22 NOTE — Progress Notes (Signed)
-[facility] CONSULT NOTE  Patient Care Team: Albina Billet, MD as PCP - General (Unknown Physician Specialty) Christene Lye, MD (General Surgery) Albina Billet, MD (Internal Medicine)  CHIEF COMPLAINTS/PURPOSE OF CONSULTATION:  Breast cancer/ erythrocytosis  #  Oncology History   # 2002- Right Breast ? Stage II lumpec [Dr.sankar] & chemo [AC x4; ? Taxol; Dr.Hathorne; [facility]] s/p RT [[place]]  # Elevated CEA [facility] Nov 2015- N; Dr.Sanker]  # ? 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  female with above history of right breast cancer- 2002- Currently on surveillance is here for follow-up. Patient is on Prolia for osteopenia.  Patient denies any new lumps or bumps. She had a recent mammogram that was negative. No nausea no vomiting. No headaches. No bone pain. Patient has noted significant improvement of her neuropathy after her acupuncture in the legs.  Denies any blood in stools or black stools. Denies any abdominal pain. No nausea no vomiting.She has not had any dental extractions recently.  ROS: A complete 10 point review of system is done which is negative except mentioned above in history of present illness  MEDICAL HISTORY:  Past Medical History:  Diagnosis Date  . Breast cancer ([place]) 2002   right breast chemo and radiation  . Cancer [facility]) 2002   right breast  . Diabetic peripheral neuropathy ([place]) 08/28/2016  . Glaucoma   . High cholesterol   . History of colon polyps   . Peripheral neuropathy 08/28/2016  . Personal history of chemotherapy 2002   BREAST CA  . Personal history of malignant neoplasm of breast   . Personal history of radiation therapy 2002   BREAST CA  . Sleep apnea 2011    SURGICAL HISTORY: Past Surgical History:  Procedure Laterality Date  . ABDOMINAL HYSTERECTOMY    . BREAST BIOPSY Right 2008   neg  . BREAST EXCISIONAL BIOPSY Right 2002   positive  . BREAST LUMPECTOMY Right 2002   chemo and radiation  . BREAST SURGERY Right 2002   LUMPECTOMY, radiation, chemotherapy  . COLONOSCOPY  2009,12/30/2013  . HYDROCELE EXCISION / REPAIR    . TRACHEAL SURGERY  2013  . TRACHEOSTOMY N/A     SOCIAL HISTORY: No smoking. No alcohol. Lives with her husband. Social History   Socioeconomic History  . Marital status: Married    Spouse name: [name]  . Number of children: Not on file  . Years of education: Not on file  . Highest education level: Not on file  Social Needs  . Financial resource strain: Not on file  . Food insecurity - worry: Not on file  . Food insecurity - inability: Not on file  . Transportation needs - medical: Not on file  . Transportation needs - non-medical: Not on file  Occupational History  . Not on file  Tobacco Use  . Smoking status: Never Smoker  . Smokeless tobacco: Never Used  Substance and Sexual Activity  . Alcohol use: No  . Drug use: No  . Sexual activity: Not on file  Other Topics Concern  . Not on file  Social History Narrative   Lives with husband  FAMILY HISTORY: Family History  Problem Relation Age of Onset  . Cancer Father        bone cancer  . Cancer Sister   . Cancer Brother   . Breast cancer Neg Hx     ALLERGIES:  is allergic to codeine; eggs or egg-derived products; shellfish allergy; and sodium sulfite.  MEDICATIONS:  Current Outpatient Medications  Medication Sig Dispense Refill  . Biotin 1000 MCG tablet Take 1,000 mcg by mouth daily.     . busPIRone (BUSPAR) 15 MG tablet Take 1 tablet by mouth 4 (four) times daily.    . Calcium Carbonate-Vitamin D3 (CALCIUM 600-D) 600-400 MG-UNIT TABS Take 1 tablet by mouth daily.     . cetirizine (ZYRTEC) 10 MG tablet Take  10 mg by mouth.    . escitalopram (LEXAPRO) 20 MG tablet Take 20 mg by mouth daily.     . ferrous sulfate 325 (65 FE) MG tablet Take 325 mg by mouth daily.    . folic acid (FOLVITE) 1 MG tablet Take 1 mg by mouth daily.    Marland Kitchen gabapentin (NEURONTIN) 400 MG capsule Take 800 mg by mouth 2 (two) times daily.    . hydrochlorothiazide (HYDRODIURIL) 25 MG tablet Take 25 mg by mouth daily.     . metFORMIN (GLUCOPHAGE) 1000 MG tablet 1 tab by mouth in AM 1.5 tab by mouth in PM    . metoprolol tartrate (LOPRESSOR) 25 MG tablet Take 1 tablet by mouth 2 (two) times daily.    . NON FORMULARY Thyroid supplement once daily    . pantoprazole (PROTONIX) 40 MG tablet Take 40 mg by mouth daily.    . pioglitazone (ACTOS) 15 MG tablet Take 15 mg by mouth daily.     . RESTASIS 0.05 % ophthalmic emulsion     . vitamin E 400 UNIT capsule Take 400 Units by mouth daily.     . NON FORMULARY Take 2 capsules by mouth daily. CereVen - dietary supplement - 2 cap per day. With NON GMO-soy products.    . NON FORMULARY Take 3 capsules by mouth daily. Bili Ven dietary supplement for [employer] Detoxification - takes 3 capsules per day (1 cap in am, 1 at noon and 1 at dinner).    . NON FORMULARY Take 3 capsules by mouth daily. Pancre Ven supplement to control blood glucose. Pt takes 3 capsules daily.    . NON FORMULARY Take 6 capsules by mouth daily. Glysen K-1 (dietary supplement use as a multivitamin). Patient takes 2 capsules in am  And 2 capsules at noon and 2 capsules at bedtime.    . NON FORMULARY Take 1 capsule by mouth. Immuno Ven- (cardiovascular supplement). Patient takes 1 capsule per day.    . NONFORMULARY OR COMPOUNDED ITEM Take 1 oz by mouth 3 (three) times daily. Black Jerry Juice mixed with 1 ounce of water. Patient takes three times a day (am, noon and bedtime)    . NONFORMULARY OR COMPOUNDED ITEM Take 5 mLs by mouth 3 (three) times daily. "Compounded Vitamin liquid Supplement to be mixed with 1/4 cup of water)  three times a day to help with gallbladder detoxifcation. Provided by chiropractor's office.     No current facility-administered medications for this visit.       Marland Kitchen  PHYSICAL EXAMINATION: ECOG PERFORMANCE STATUS: 0 - Asymptomatic  Vitals:   01/22/17 1410  BP: 94/65  Pulse: 64  Resp: 20  Temp: 97.8 F (36.6 C)   Filed Weights  01/22/17 1410  Weight: 154 lb (69.9 kg)    GENERAL: Well-nourished well-developed; Alert, no distress and comfortable.She is alone.  EYES: no pallor or icterus OROPHARYNX: no thrush or ulceration; good dentition;  NECK: supple, no masses felt; Tracheostomy noted  LYMPH:  no palpable lymphadenopathy in the cervical, axillary or inguinal regions LUNGS: clear to auscultation and  No wheeze or crackles HEART/CVS: regular rate & rhythm and no murmurs; No lower extremity edema ABDOMEN: abdomen soft, non-tender and normal bowel sounds Musculoskeletal:no cyanosis of digits and no clubbing  PSYCH: alert & oriented x 3 with fluent speech NEURO: no focal motor/sensory deficits SKIN:  no rashes or significant lesions  LABORATORY DATA:  I have reviewed the data as listed Lab Results  Component Value Date   WBC 8.4 01/15/2017   HGB 13.8 01/15/2017   HCT 40.8 01/15/2017   MCV 85.5 01/15/2017   PLT 193 01/15/2017   Recent Labs    02/22/16 1409 06/29/16 1342 08/28/16 1132 01/22/17 1435  NA 134* 127* 143  --   K 4.1 3.8 5.3*  --   CL 97* 92* 102  --   CO2 27 27 25   --   GLUCOSE 148* 112* 99  --   BUN 27* 12 16 19   CREATININE 0.95 0.80 1.03* 1.05*  CALCIUM 9.6 9.6 10.1 9.5  GFRNONAA 58* >60 54* 51*  GFRAA >60 >60 62 59*  PROT 8.0 7.5 7.6  --   ALBUMIN 4.2 4.0 4.3  --   AST 24 25 20   --   ALT 23 21 19   --   ALKPHOS 75 68 77  --   BILITOT 0.7 0.9 0.4  --     RADIOGRAPHIC STUDIES: I have personally reviewed the radiological images as listed and agreed with the findings in the report. No results found.  ASSESSMENT & PLAN:   Carcinoma of  overlapping sites of right breast in female, estrogen receptor negative ([place]) # Right breast cancer stage II; ER/ PR her 2 neu- NEG. Clinically no evidence of recurrence. Last mammogram October 2018 within normal limits.  # Elevated Tumor Marker- likely secondary to recurrent malignancy. Today's CEA is 10/fairly stable over the last 1 year. No plans for any scans at this time.  # Osteopenia- Dexa Scan 2016- T-score -1.4. Continue Prolia; due today. Calcium within normal limits. Patient calcium and vitamin D. BMD- prior to next visit.   # Peripheral neuropathy-Dr.Naylor s/p acupuncture; improved. On neurontin 400 BID.   # follow up in 6 months with labs (labs a few days prior CBC, CMET, CEA, CA2729) and MD and Prolia injection. BMD prior.    All questions were answered. The patient knows to call the clinic with any problems, questions or concerns.    Cammie Sickle, MD 01/22/2017 6:38 PM

## 2017-01-22 NOTE — Patient Instructions (Signed)

## 2017-02-28 ENCOUNTER — Encounter: Payer: Self-pay | Admitting: General Surgery

## 2017-02-28 ENCOUNTER — Ambulatory Visit (INDEPENDENT_AMBULATORY_CARE_PROVIDER_SITE_OTHER): Payer: Medicare Other | Admitting: General Surgery

## 2017-02-28 VITALS — BP 124/74 | HR 84 | Resp 14 | Ht 66.0 in | Wt 157.0 lb

## 2017-02-28 DIAGNOSIS — M7591 Shoulder lesion, unspecified, right shoulder: Secondary | ICD-10-CM

## 2017-02-28 NOTE — Patient Instructions (Signed)
The patient is aware to call back for any questions or concerns.  

## 2017-02-28 NOTE — Progress Notes (Signed)
Patient ID: Angela David, female   DOB: [birth year], 77 y.o.   MRN: [id number]  Chief Complaint  Patient presents with  . Other    HPI Angela David is a 77 y.o. female here today for a evaluation of lump near her right shoulder. She noticed the area about 2 weeks ago. No pain or tenderness.  She has lost 30 pounds within the last year, she states her diabetes and kidneys were the major problems. Her weight has since stabilized. . The patient was concerned as her sister was noted to have diffuse nodules and was diagnosed with B-cell  lymphoma, dying in 2003. HPI  Past Medical History:  Diagnosis Date  . Breast cancer ([place]) 2002   right breast chemo and radiation  . Cancer [facility]) 2002   right breast  . Diabetic peripheral neuropathy ([place]) 08/28/2016  . Glaucoma   . High cholesterol   . History of colon polyps   . Peripheral neuropathy 08/28/2016  . Personal history of chemotherapy 2002   BREAST CA  . Personal history of malignant neoplasm of breast   . Personal history of radiation therapy 2002   BREAST CA  . Sleep apnea 2011    Past Surgical History:  Procedure Laterality Date  . ABDOMINAL HYSTERECTOMY    . BREAST BIOPSY Right 2008   neg  . BREAST EXCISIONAL BIOPSY Right 2002   positive  . BREAST LUMPECTOMY Right 2002   chemo and radiation  . BREAST SURGERY Right 2002   LUMPECTOMY, radiation, chemotherapy  . COLONOSCOPY  2009,12/30/2013  . HYDROCELE EXCISION / REPAIR    . TRACHEAL SURGERY  2013  . TRACHEOSTOMY N/A     Family History  Problem Relation Age of Onset  . Cancer Father        bone cancer  . Cancer Sister        lymphoma  . Cancer Brother   . Breast cancer Neg Hx     Social History Social History   Tobacco Use  . Smoking status: Never Smoker  . Smokeless tobacco: Never Used  Substance Use Topics  . Alcohol use: No  . Drug use: No    Allergies  Allergen Reactions  . Codeine     Per patient "it kept her up all night"  . Eggs Or  Egg-Derived Products     respitatory distress   . Shellfish Allergy     respitatory distress    . Sodium Sulfite Other (See Comments)    Current Outpatient Medications  Medication Sig Dispense Refill  . Biotin 1000 MCG tablet Take 1,000 mcg by mouth daily.     . busPIRone (BUSPAR) 15 MG tablet Take 1 tablet by mouth 4 (four) times daily.    . Calcium Carbonate-Vitamin D3 (CALCIUM 600-D) 600-400 MG-UNIT TABS Take 1 tablet by mouth daily.     . cetirizine (ZYRTEC) 10 MG tablet Take 10 mg by mouth.    . escitalopram (LEXAPRO) 20 MG tablet Take 20 mg by mouth daily.     . ferrous sulfate 325 (65 FE) MG tablet Take 325 mg by mouth daily.    . folic acid (FOLVITE) 1 MG tablet Take 1 mg by mouth daily.    Marland Kitchen gabapentin (NEURONTIN) 400 MG capsule Take 800 mg by mouth 2 (two) times daily.    . hydrochlorothiazide (HYDRODIURIL) 25 MG tablet Take 25 mg by mouth daily.     . metFORMIN (GLUCOPHAGE) 1000 MG tablet 1 tab by mouth in AM  1.5 tab by mouth in PM    . metoprolol tartrate (LOPRESSOR) 25 MG tablet Take 1 tablet by mouth 2 (two) times daily.    . NON FORMULARY Take 2 capsules by mouth daily. CereVen - dietary supplement - 2 cap per day. With NON GMO-soy products.    . NON FORMULARY Take 3 capsules by mouth daily. Bili Ven dietary supplement for [employer] Detoxification - takes 3 capsules per day (1 cap in am, 1 at noon and 1 at dinner).    . NON FORMULARY Take 3 capsules by mouth daily. Pancre Ven supplement to control blood glucose. Pt takes 3 capsules daily.    . NON FORMULARY Take 6 capsules by mouth daily. Glysen K-1 (dietary supplement use as a multivitamin). Patient takes 2 capsules in am  And 2 capsules at noon and 2 capsules at bedtime.    . NON FORMULARY Take 1 capsule by mouth. Immuno Ven- (cardiovascular supplement). Patient takes 1 capsule per day.    . NON FORMULARY Thyroid supplement once daily    . NONFORMULARY OR COMPOUNDED ITEM Take 1 oz by mouth 3 (three) times daily. Black  Jerry Juice mixed with 1 ounce of water. Patient takes three times a day (am, noon and bedtime)    . NONFORMULARY OR COMPOUNDED ITEM Take 5 mLs by mouth 3 (three) times daily. "Compounded Vitamin liquid Supplement to be mixed with 1/4 cup of water) three times a day to help with gallbladder detoxifcation. Provided by chiropractor's office.    . pantoprazole (PROTONIX) 40 MG tablet Take 40 mg by mouth daily.    . pioglitazone (ACTOS) 15 MG tablet Take 15 mg by mouth daily.     . RESTASIS 0.05 % ophthalmic emulsion     . vitamin E 400 UNIT capsule Take 400 Units by mouth daily.      No current facility-administered medications for this visit.     Review of Systems Review of Systems  Constitutional: Negative.   Respiratory: Negative.   Cardiovascular: Negative.     Blood pressure 124/74, pulse 84, resp. rate 14, height 5\' 6"  (1.676 m), weight 157 lb (71.2 kg).  Physical Exam Physical Exam  Constitutional: She is oriented to person, place, and time. She appears well-developed and well-nourished.  Cardiovascular:  Pulses:      Femoral pulses are 2+ on the right side, and 2+ on the left side. Pulmonary/Chest:    Abdominal: Soft. Normal appearance. There is no hepatosplenomegaly.  Lymphadenopathy:       Head (right side): No occipital adenopathy present.       Head (left side): No posterior auricular and no occipital adenopathy present.    She has no cervical adenopathy.    She has no axillary adenopathy.       Right: No inguinal and no supraclavicular adenopathy present.       Left: No inguinal and no supraclavicular adenopathy present.  Neurological: She is alert and oriented to person, place, and time.  Skin: Skin is warm and dry.  Psychiatric: Her behavior is normal.    Data Reviewed October 28 2, 2018 screening mammogram report: Negative.  Assessment    Bony prominence of the right shoulder girdle without evidence of pathology.  No evidence of lymphadenopathy.     Plan          Follow up as scheduled in October 2019 for routine breast exam.   HPI, Physical Exam, Assessment and Plan have been scribed under the direction and in  the presence of Robert Bellow, MD. Karie Fetch, RN    Robert Bellow 02/28/2017, 7:03 PM

## 2017-03-14 ENCOUNTER — Encounter: Payer: Self-pay | Admitting: Rehabilitation

## 2017-03-14 NOTE — Therapy (Signed)
[facility] [address] [place], [place], [address] Phone: [phone]   Fax:  [phone]  Patient Details  Name: Angela David MRN: [id number] Date of Birth: Sep 20, 1940 Referring Provider:  No ref. provider found  Encounter Date: 03/14/2017    PHYSICAL THERAPY DISCHARGE SUMMARY  Visits from Start of Care: 17  Current functional level related to goals / functional outcomes: PT Long Term Goals - 11/12/16 1703      PT LONG TERM GOAL #1   Title  Pt will be independent with final HEP in order to indicate improved function and decreased fall risk.      Time  4    Period  Weeks    Status  On-going    Target Date  12/12/16      PT LONG TERM GOAL #2   Title  Will assess FGA and improve score by 3 points from baseline in order to indicate decreased fall risk.      Time  4    Period  Weeks    Status  New    Target Date  12/12/16      PT LONG TERM GOAL #3   Title  Pt will report no more than 4/10 pain in upper thoracic/cervical region in order to indicate improved function.      Time  4    Period  Weeks    Status  On-going    Target Date  12/12/16      PT LONG TERM GOAL #4   Title  Pt will improve TUG </=13.5 secs w/ LRAD in order to indicate decreased fall risk.      Baseline  12.03 09/2016    Time  8    Period  Weeks    Status  Achieved      PT LONG TERM GOAL #5   Title  Pt will ambulate up to 500' over unlevel paved outdoor surfaces w/ LRAD at mod I level in order to indicate safe community negotiation.     Time  4    Period  Weeks    Status  On-going    Target Date  12/12/16      PT LONG TERM GOAL #6   Title  Pt will improve ABC FOTO score to >/=50% in order to indicate self reported improved confidence in balance.      Time  4    Period  Weeks    Status  On-going      PT LONG TERM GOAL #7   Title  Pt will improve cervical ROM (flex/ext/R/Lrotation) by 10 deg in order to indicate improved  functional and decreased pain.      Time  4    Period  Weeks    Status  On-going    Target Date  12/12/16         Remaining deficits: Unsure as she did not return for follow up visits   Education / Equipment: HEP  Plan: Patient agrees to discharge.  Patient goals were not met. Patient is being discharged due to not returning since the last visit.  ?????         Cameron Sprang, PT, MPT [facility] [address] [place], [place], [address] Phone: [phone]   Fax:  [phone] 03/14/17, 1:53 PM

## 2017-05-15 ENCOUNTER — Ambulatory Visit: Payer: Medicare Other | Admitting: Obstetrics and Gynecology

## 2017-08-20 ENCOUNTER — Inpatient Hospital Stay: Payer: Medicare Other

## 2017-08-20 ENCOUNTER — Inpatient Hospital Stay: Payer: Medicare Other | Admitting: Internal Medicine

## 2017-08-20 ENCOUNTER — Other Ambulatory Visit: Payer: Self-pay | Admitting: Internal Medicine

## 2017-08-20 ENCOUNTER — Telehealth: Payer: Self-pay

## 2017-08-20 DIAGNOSIS — C50811 Malignant neoplasm of overlapping sites of right female breast: Secondary | ICD-10-CM

## 2017-08-20 DIAGNOSIS — Z171 Estrogen receptor negative status [ER-]: Secondary | ICD-10-CM

## 2017-08-20 NOTE — Assessment & Plan Note (Deleted)
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 Clinically no evidence of recurrence. Last mammogram October 2018 within normal limits.  # Elevated Tumor Marker- likely secondary to recurrent malignancy. Today's CEA is 10/fairly stable over the last 1 year. No plans for any scans at this time.  # Osteopenia- Dexa Scan 2016- T-score -1.4. Continue Prolia; due today. Calcium within normal limits. Patient calcium and vitamin D. BMD- prior to next visit.   # Peripheral neuropathy-Dr.Naylor s/p acupuncture; improved. On neurontin 400 BID.   # follow up in 6 months with labs (labs a few days prior CBC, CMET, CEA, [id number]) and MD and Prolia injection. BMD prior.

## 2017-08-20 NOTE — Progress Notes (Deleted)
[place] OFFICE PROGRESS NOTE  Patient Care Team: Albina Billet, MD as PCP - General (Unknown Physician Specialty) Christene Lye, MD (General Surgery) Albina Billet, MD (Internal Medicine)  Cancer Staging No matching staging information was found for the patient.   Oncology History   # 2002- Right Breast ? Stage II lumpec [Dr.sankar] & chemo [AC x4; ? Taxol; Dr.Hathorne; [facility]] s/p RT [[place]]  # Elevated CEA [facility] Nov 2015- N; Dr.Sanker]  # ? 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  female pleasant patient above history of  ROS    PAST MEDICAL HISTORY :  Past Medical History:  Diagnosis Date  . Breast cancer ([place]) 2002   right breast chemo and radiation  . Cancer [facility]) 2002   right breast  . 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 Glaucoma   . High cholesterol   . History of colon polyps   . Peripheral neuropathy 08/28/2016  . Personal history of chemotherapy 2002   BREAST CA  . Personal history of malignant neoplasm of breast   . Personal history of radiation therapy 2002   BREAST CA  . Sleep apnea 2011    PAST SURGICAL HISTORY :   Past Surgical History:  Procedure Laterality Date  . ABDOMINAL HYSTERECTOMY    . BREAST BIOPSY Right 2008   neg  . BREAST EXCISIONAL BIOPSY Right 2002   positive  . BREAST LUMPECTOMY Right 2002   chemo and radiation  . BREAST SURGERY Right 2002   LUMPECTOMY, radiation, chemotherapy  . COLONOSCOPY  2009,12/30/2013  . HYDROCELE EXCISION / REPAIR    . TRACHEAL SURGERY  2013  . TRACHEOSTOMY N/A     FAMILY HISTORY :   Family History  Problem Relation Age of Onset  . Cancer Father        bone cancer  . Cancer  Sister        lymphoma  . Cancer Brother   . Breast cancer Neg Hx     SOCIAL HISTORY:   Social History   Tobacco Use  . Smoking status: Never Smoker  . Smokeless tobacco: Never Used  Substance Use Topics  . Alcohol use: No  . Drug use: No    ALLERGIES:  is allergic to codeine; eggs or egg-derived products; shellfish allergy; and sodium sulfite.  MEDICATIONS:  Current Outpatient Medications  Medication Sig Dispense Refill  . Biotin 1000 MCG tablet Take 1,000 mcg by mouth daily.     . busPIRone (BUSPAR) 15 MG tablet Take 1 tablet by mouth 4 (four) times daily.    . Calcium Carbonate-Vitamin D3 (CALCIUM 600-D) 600-400 MG-UNIT TABS Take 1 tablet by mouth daily.     . cetirizine (ZYRTEC) 10 MG tablet Take 10 mg by mouth.    . escitalopram (LEXAPRO) 20 MG tablet Take 20 mg by mouth daily.     . ferrous sulfate 325 (65 FE) MG tablet Take 325 mg by mouth daily.    . folic acid (FOLVITE) 1 MG tablet Take 1 mg by mouth daily.    Marland Kitchen gabapentin (NEURONTIN) 400 MG capsule Take 800 mg by mouth 2 (two) times daily.    Marland Kitchen  hydrochlorothiazide (HYDRODIURIL) 25 MG tablet Take 25 mg by mouth daily.     . metFORMIN (GLUCOPHAGE) 1000 MG tablet 1 tab by mouth in AM 1.5 tab by mouth in PM    . metoprolol tartrate (LOPRESSOR) 25 MG tablet Take 1 tablet by mouth 2 (two) times daily.    . NON FORMULARY Take 2 capsules by mouth daily. CereVen - dietary supplement - 2 cap per day. With NON GMO-soy products.    . NON FORMULARY Take 3 capsules by mouth daily. Bili Ven dietary supplement for [employer] Detoxification - takes 3 capsules per day (1 cap in am, 1 at noon and 1 at dinner).    . NON FORMULARY Take 3 capsules by mouth daily. Pancre Ven supplement to control blood glucose. Pt takes 3 capsules daily.    . NON FORMULARY Take 6 capsules by mouth daily. Glysen K-1 (dietary supplement use as a multivitamin). Patient takes 2 capsules in am  And 2 capsules at noon and 2 capsules at bedtime.    . NON FORMULARY  Take 1 capsule by mouth. Immuno Ven- (cardiovascular supplement). Patient takes 1 capsule per day.    . NON FORMULARY Thyroid supplement once daily    . NONFORMULARY OR COMPOUNDED ITEM Take 1 oz by mouth 3 (three) times daily. Black Jerry Juice mixed with 1 ounce of water. Patient takes three times a day (am, noon and bedtime)    . NONFORMULARY OR COMPOUNDED ITEM Take 5 mLs by mouth 3 (three) times daily. "Compounded Vitamin liquid Supplement to be mixed with 1/4 cup of water) three times a day to help with gallbladder detoxifcation. Provided by chiropractor's office.    . pantoprazole (PROTONIX) 40 MG tablet Take 40 mg by mouth daily.    . pioglitazone (ACTOS) 15 MG tablet Take 15 mg by mouth daily.     . RESTASIS 0.05 % ophthalmic emulsion     . vitamin E 400 UNIT capsule Take 400 Units by mouth daily.      No current facility-administered medications for this visit.     PHYSICAL EXAMINATION: ECOG PERFORMANCE STATUS: {CHL ONC ECOG PS:[id number]}  There were no vitals taken for this visit.  There were no vitals filed for this visit.  GENERAL: Well-nourished well-developed; Alert, no distress and comfortable.  *** Alone/Accompanied by family.  EYES: no pallor or icterus OROPHARYNX: no thrush or ulceration; NECK: supple; no lymph nodes felt. LYMPH:  no palpable lymphadenopathy in the axillary or inguinal regions LUNGS: Decreased breath sounds auscultation bilaterally. No wheeze or crackles HEART/CVS: regular rate & rhythm and no murmurs; No lower extremity edema ABDOMEN:abdomen soft, non-tender and normal bowel sounds. No hepatomegaly or splenomegaly.  Musculoskeletal:no cyanosis of digits and no clubbing  PSYCH: alert & oriented x 3 with fluent speech NEURO: no focal motor/sensory deficits SKIN:  no rashes or significant lesions    LABORATORY DATA:  I have reviewed the data as listed    Component Value Date/Time   NA 143 08/28/2016 1132   NA 139 12/17/2011 0650   K 5.3 (H)  08/28/2016 1132   K 4.1 12/17/2011 0650   CL 102 08/28/2016 1132   CL 106 12/17/2011 0650   CO2 25 08/28/2016 1132   CO2 24 12/17/2011 0650   GLUCOSE 99 08/28/2016 1132   GLUCOSE 112 (H) 06/29/2016 1342   GLUCOSE 209 (H) 12/17/2011 0650   BUN 19 01/22/2017 1435   BUN 16 08/28/2016 1132   BUN 15 12/17/2011 0650   CREATININE 1.05 (  H) 01/22/2017 1435   CREATININE 0.88 12/17/2011 0650   CALCIUM 9.5 01/22/2017 1435   CALCIUM 8.8 12/17/2011 0650   PROT 7.6 08/28/2016 1132   PROT 8.4 (H) 12/16/2011 1546   ALBUMIN 4.3 08/28/2016 1132   ALBUMIN 4.1 12/16/2011 1546   AST 20 08/28/2016 1132   AST 29 12/16/2011 1546   ALT 19 08/28/2016 1132   ALT 26 12/16/2011 1546   ALKPHOS 77 08/28/2016 1132   ALKPHOS 92 12/16/2011 1546   BILITOT 0.4 08/28/2016 1132   BILITOT 0.5 12/16/2011 1546   GFRNONAA 51 (L) 01/22/2017 1435   GFRNONAA >60 12/17/2011 0650   GFRAA 59 (L) 01/22/2017 1435   GFRAA >60 12/17/2011 0650    No results found for: SPEP, UPEP  Lab Results  Component Value Date   WBC 8.4 01/15/2017   NEUTROABS 6.0 01/15/2017   HGB 13.8 01/15/2017   HCT 40.8 01/15/2017   MCV 85.5 01/15/2017   PLT 193 01/15/2017      Chemistry      Component Value Date/Time   NA 143 08/28/2016 1132   NA 139 12/17/2011 0650   K 5.3 (H) 08/28/2016 1132   K 4.1 12/17/2011 0650   CL 102 08/28/2016 1132   CL 106 12/17/2011 0650   CO2 25 08/28/2016 1132   CO2 24 12/17/2011 0650   BUN 19 01/22/2017 1435   BUN 16 08/28/2016 1132   BUN 15 12/17/2011 0650   CREATININE 1.05 (H) 01/22/2017 1435   CREATININE 0.88 12/17/2011 0650      Component Value Date/Time   CALCIUM 9.5 01/22/2017 1435   CALCIUM 8.8 12/17/2011 0650   ALKPHOS 77 08/28/2016 1132   ALKPHOS 92 12/16/2011 1546   AST 20 08/28/2016 1132   AST 29 12/16/2011 1546   ALT 19 08/28/2016 1132   ALT 26 12/16/2011 1546   BILITOT 0.4 08/28/2016 1132   BILITOT 0.5 12/16/2011 1546       RADIOGRAPHIC STUDIES: I have personally reviewed  the radiological images as listed and agreed with the findings in the report. No results found.   ASSESSMENT & PLAN:  No problem-specific Assessment & Plan notes found for this encounter.   No orders of the defined types were placed in this encounter.  All questions were answered. The patient knows to call the clinic with any problems, questions or concerns.      Cammie Sickle, MD 08/20/2017 8:47 AM

## 2017-08-20 NOTE — Telephone Encounter (Signed)
Error

## 2017-08-27 ENCOUNTER — Ambulatory Visit: Payer: Medicare Other | Admitting: Internal Medicine

## 2017-08-27 ENCOUNTER — Ambulatory Visit: Payer: Medicare Other

## 2017-08-27 ENCOUNTER — Other Ambulatory Visit: Payer: Medicare Other

## 2017-09-03 ENCOUNTER — Inpatient Hospital Stay: Payer: Medicare Other | Attending: Internal Medicine | Admitting: Internal Medicine

## 2017-09-03 ENCOUNTER — Inpatient Hospital Stay: Payer: Medicare Other

## 2017-09-03 ENCOUNTER — Other Ambulatory Visit: Payer: Self-pay

## 2017-09-03 ENCOUNTER — Encounter: Payer: Self-pay | Admitting: Internal Medicine

## 2017-09-03 VITALS — BP 98/62 | HR 76 | Temp 97.0°F | Resp 18 | Wt 165.3 lb

## 2017-09-03 DIAGNOSIS — Z171 Estrogen receptor negative status [ER-]: Secondary | ICD-10-CM

## 2017-09-03 DIAGNOSIS — M8580 Other specified disorders of bone density and structure, unspecified site: Secondary | ICD-10-CM | POA: Diagnosis not present

## 2017-09-03 DIAGNOSIS — G620 Drug-induced polyneuropathy: Secondary | ICD-10-CM | POA: Diagnosis not present

## 2017-09-03 DIAGNOSIS — C50811 Malignant neoplasm of overlapping sites of right female breast: Secondary | ICD-10-CM | POA: Diagnosis not present

## 2017-09-03 DIAGNOSIS — M810 Age-related osteoporosis without current pathological fracture: Secondary | ICD-10-CM

## 2017-09-03 LAB — COMPREHENSIVE METABOLIC PANEL
ALBUMIN: 4.1 g/dL (ref 3.5–5.0)
ALK PHOS: 64 U/L (ref 38–126)
ALT: 21 U/L (ref 0–44)
AST: 24 U/L (ref 15–41)
Anion gap: 14 (ref 5–15)
BUN: 24 mg/dL — ABNORMAL HIGH (ref 8–23)
CALCIUM: 10.1 mg/dL (ref 8.9–10.3)
CO2: 26 mmol/L (ref 22–32)
CREATININE: 1.06 mg/dL — AB (ref 0.44–1.00)
Chloride: 100 mmol/L (ref 98–111)
GFR calc non Af Amer: 50 mL/min — ABNORMAL LOW (ref >60)
GFR, EST AFRICAN AMERICAN: 58 mL/min — AB (ref >60)
GLUCOSE: 134 mg/dL — AB (ref 70–99)
Potassium: 4.5 mmol/L (ref 3.5–5.1)
SODIUM: 140 mmol/L (ref 135–145)
Total Bilirubin: 0.6 mg/dL (ref 0.3–1.2)
Total Protein: 8.2 g/dL — ABNORMAL HIGH (ref 6.5–8.1)

## 2017-09-03 LAB — CBC WITH DIFFERENTIAL/PLATELET
BASOS PCT: 1 %
Basophils Absolute: 0.1 10*3/uL (ref 0–0.1)
EOS PCT: 2 %
Eosinophils Absolute: 0.1 10*3/uL (ref 0–0.7)
HCT: 41.8 % (ref 35.0–47.0)
Hemoglobin: 13.9 g/dL (ref 12.0–16.0)
Lymphocytes Relative: 21 %
Lymphs Abs: 1.7 10*3/uL (ref 1.0–3.6)
MCH: 28.3 pg (ref 26.0–34.0)
MCHC: 33.2 g/dL (ref 32.0–36.0)
MCV: 85.2 fL (ref 80.0–100.0)
MONO ABS: 0.4 10*3/uL (ref 0.2–0.9)
MONOS PCT: 5 %
Neutro Abs: 5.9 10*3/uL (ref 1.4–6.5)
Neutrophils Relative %: 71 %
Platelets: 193 10*3/uL (ref 150–440)
RBC: 4.91 MIL/uL (ref 3.80–5.20)
RDW: 12.9 % (ref 11.5–14.5)
WBC: 8.2 10*3/uL (ref 3.6–11.0)

## 2017-09-03 MED ORDER — DENOSUMAB 60 MG/ML ~~LOC~~ SOSY
60.0000 mg | PREFILLED_SYRINGE | Freq: Once | SUBCUTANEOUS | Status: AC
  Administered 2017-09-03: 60 mg via SUBCUTANEOUS

## 2017-09-03 NOTE — Assessment & Plan Note (Addendum)
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 Clinically no evidence of recurrence. Last mammogram October 2018 within normal limits.  # Elevated Tumor Marker- likely secondary to recurrent malignancy.  Stable today's CEA is 11/fairly stable over the last 1 year.   # Osteopenia- Dexa Scan 2016- T-score -1.4.  Clinically stable.  Continue Prolia; due today. Calcium within normal limits. + Patient calcium and vitamin D. BMD- prior to next visit.   # Peripheral neuropathy-stable continue Neurontin.  # follow up in 6 months with labs (labs a few days prior CBC, CMET, CEA, [id number]) and MD and Prolia injection. BMD prior.

## 2017-09-03 NOTE — Progress Notes (Signed)
[place] OFFICE PROGRESS NOTE  Patient Care Team: Albina Billet, MD as PCP - General (Unknown Physician Specialty) Christene Lye, MD (General Surgery) Albina Billet, MD (Internal Medicine)  Cancer Staging No matching staging information was found for the patient.   Oncology History   # 2002- Right Breast ? Stage II lumpec [Dr.sankar] & chemo [AC x4; ? Taxol; Dr.Hathorne; [facility]] s/p RT [[place]]  # Elevated CEA [facility] Nov 2015- N; Dr.Sanker]  # ? 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  female pleasant patient above history of breast cancer and also osteoporosis is here for follow-up.  Patient denies any new lumps or bumps.  Appetite is good.  No weight loss.  Complains of chronic tingling and numbness in the extremities.  Not any worse.  Review of Systems  Constitutional: Negative for chills, diaphoresis, fever, malaise/fatigue and weight loss.  HENT: Negative for nosebleeds and sore throat.   Eyes: Negative for double vision.  Respiratory: Negative for cough, hemoptysis, sputum production, shortness of breath and wheezing.   Cardiovascular: Negative for chest pain, palpitations, orthopnea and leg swelling.  Gastrointestinal: Negative for abdominal pain, blood in stool, constipation, diarrhea, heartburn, melena, nausea and vomiting.  Genitourinary: Negative for dysuria, frequency and urgency.  Musculoskeletal: Negative for back pain and joint pain.  Skin: Negative.   Negative for itching and rash.  Neurological: Positive for tingling. Negative for dizziness, focal weakness, weakness and headaches.  Endo/Heme/Allergies: Does not bruise/bleed easily.  Psychiatric/Behavioral: Negative for depression. The patient is not nervous/anxious and does not have insomnia.       PAST MEDICAL HISTORY :  Past Medical History:  Diagnosis Date  . Breast cancer ([place]) 2002   right breast chemo and radiation  . Cancer [facility]) 2002   right breast  . Diabetic peripheral neuropathy ([place]) 08/28/2016  . Glaucoma   . High cholesterol   . History of colon polyps   . Peripheral neuropathy 08/28/2016  . Personal history of chemotherapy 2002   BREAST CA  . Personal history of malignant neoplasm of breast   . Personal history of radiation therapy 2002   BREAST CA  . Sleep apnea 2011    PAST SURGICAL HISTORY :   Past Surgical History:  Procedure Laterality Date  . ABDOMINAL HYSTERECTOMY    . BREAST BIOPSY Right 2008   neg  . BREAST EXCISIONAL BIOPSY Right 2002   positive  . BREAST LUMPECTOMY Right 2002   chemo and radiation  . BREAST SURGERY Right 2002   LUMPECTOMY, radiation, chemotherapy  . COLONOSCOPY  2009,12/30/2013  . HYDROCELE EXCISION / REPAIR    . TRACHEAL SURGERY  2013  . TRACHEOSTOMY N/A     FAMILY HISTORY :   Family History  Problem Relation Age of Onset  . Cancer Father        bone cancer  . Cancer Sister        lymphoma  . Cancer Brother   . Breast cancer  Neg Hx     SOCIAL HISTORY:   Social History   Tobacco Use  . Smoking status: Never Smoker  . Smokeless tobacco: Never Used  Substance Use Topics  . Alcohol use: No  . Drug use: No    ALLERGIES:  is allergic to codeine; eggs or egg-derived products; shellfish allergy; and sodium sulfite.  MEDICATIONS:  Current Outpatient Medications  Medication Sig Dispense Refill  . Biotin 1000 MCG tablet Take 1,000 mcg by mouth daily.     . busPIRone (BUSPAR) 15 MG tablet Take 1 tablet by mouth 4  (four) times daily.    . Calcium Carbonate-Vitamin D3 (CALCIUM 600-D) 600-400 MG-UNIT TABS Take 1 tablet by mouth daily.     . cetirizine (ZYRTEC) 10 MG tablet Take 10 mg by mouth.    . escitalopram (LEXAPRO) 20 MG tablet Take 20 mg by mouth daily.     . folic acid (FOLVITE) 1 MG tablet Take 1 mg by mouth daily.    Marland Kitchen gabapentin (NEURONTIN) 400 MG capsule Take 800 mg by mouth 2 (two) times daily.    . hydrochlorothiazide (HYDRODIURIL) 25 MG tablet Take 25 mg by mouth daily.     . metFORMIN (GLUCOPHAGE) 1000 MG tablet 1 tab by mouth in AM 1.5 tab by mouth in PM    . metoprolol tartrate (LOPRESSOR) 25 MG tablet Take 1 tablet by mouth 2 (two) times daily.    . pantoprazole (PROTONIX) 40 MG tablet Take 40 mg by mouth daily.    . pioglitazone (ACTOS) 15 MG tablet Take 15 mg by mouth daily.     . RESTASIS 0.05 % ophthalmic emulsion     . vitamin E 400 UNIT capsule Take 400 Units by mouth daily.      No current facility-administered medications for this visit.     PHYSICAL EXAMINATION: ECOG PERFORMANCE STATUS: 0 - Asymptomatic  BP 98/62   Pulse 76   Temp (!) 97 F (36.1 C) (Tympanic)   Resp 18   Wt 165 lb 5.5 oz (75 kg)   BMI 26.69 kg/m   Filed Weights   09/03/17 1433  Weight: 165 lb 5.5 oz (75 kg)    GENERAL: Well-nourished well-developed; Alert, no distress and comfortable.  Alone. Marland Kitchen  EYES: no pallor or icterus OROPHARYNX: no thrush or ulceration; NECK: supple; no lymph nodes felt.positive for trach.  LYMPH:  no palpable lymphadenopathy in the axillary or inguinal regions LUNGS: Decreased breath sounds auscultation bilaterally. No wheeze or crackles HEART/CVS: regular rate & rhythm and no murmurs; No lower extremity edema ABDOMEN:abdomen soft, non-tender and normal bowel sounds. No hepatomegaly or splenomegaly.  Musculoskeletal:no cyanosis of digits and no clubbing  PSYCH: alert & oriented x 3 with fluent speech NEURO: no focal motor/sensory deficits SKIN:  no rashes or  significant lesions    LABORATORY DATA:  I have reviewed the data as listed    Component Value Date/Time   NA 140 09/03/2017 1411   NA 143 08/28/2016 1132   NA 139 12/17/2011 0650   K 4.5 09/03/2017 1411   K 4.1 12/17/2011 0650   CL 100 09/03/2017 1411   CL 106 12/17/2011 0650   CO2 26 09/03/2017 1411   CO2 24 12/17/2011 0650   GLUCOSE 134 (H) 09/03/2017 1411   GLUCOSE 209 (H) 12/17/2011 0650   BUN 24 (H) 09/03/2017 1411   BUN 16 08/28/2016 1132   BUN 15 12/17/2011 0650   CREATININE 1.06 (H) 09/03/2017 1411   CREATININE 0.88 12/17/2011 0650  CALCIUM 10.1 09/03/2017 1411   CALCIUM 8.8 12/17/2011 0650   PROT 8.2 (H) 09/03/2017 1411   PROT 7.6 08/28/2016 1132   PROT 8.4 (H) 12/16/2011 1546   ALBUMIN 4.1 09/03/2017 1411   ALBUMIN 4.3 08/28/2016 1132   ALBUMIN 4.1 12/16/2011 1546   AST 24 09/03/2017 1411   AST 29 12/16/2011 1546   ALT 21 09/03/2017 1411   ALT 26 12/16/2011 1546   ALKPHOS 64 09/03/2017 1411   ALKPHOS 92 12/16/2011 1546   BILITOT 0.6 09/03/2017 1411   BILITOT 0.4 08/28/2016 1132   BILITOT 0.5 12/16/2011 1546   GFRNONAA 50 (L) 09/03/2017 1411   GFRNONAA >60 12/17/2011 0650   GFRAA 58 (L) 09/03/2017 1411   GFRAA >60 12/17/2011 0650    No results found for: SPEP, UPEP  Lab Results  Component Value Date   WBC 8.2 09/03/2017   NEUTROABS 5.9 09/03/2017   HGB 13.9 09/03/2017   HCT 41.8 09/03/2017   MCV 85.2 09/03/2017   PLT 193 09/03/2017      Chemistry      Component Value Date/Time   NA 140 09/03/2017 1411   NA 143 08/28/2016 1132   NA 139 12/17/2011 0650   K 4.5 09/03/2017 1411   K 4.1 12/17/2011 0650   CL 100 09/03/2017 1411   CL 106 12/17/2011 0650   CO2 26 09/03/2017 1411   CO2 24 12/17/2011 0650   BUN 24 (H) 09/03/2017 1411   BUN 16 08/28/2016 1132   BUN 15 12/17/2011 0650   CREATININE 1.06 (H) 09/03/2017 1411   CREATININE 0.88 12/17/2011 0650      Component Value Date/Time   CALCIUM 10.1 09/03/2017 1411   CALCIUM 8.8  12/17/2011 0650   ALKPHOS 64 09/03/2017 1411   ALKPHOS 92 12/16/2011 1546   AST 24 09/03/2017 1411   AST 29 12/16/2011 1546   ALT 21 09/03/2017 1411   ALT 26 12/16/2011 1546   BILITOT 0.6 09/03/2017 1411   BILITOT 0.4 08/28/2016 1132   BILITOT 0.5 12/16/2011 1546       RADIOGRAPHIC STUDIES: I have personally reviewed the radiological images as listed and agreed with the findings in the report. No results found.   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 Clinically no evidence of recurrence. Last mammogram October 2018 within normal limits.  # Elevated Tumor Marker- likely secondary to recurrent malignancy.  Stable today's CEA is 11/fairly stable over the last 1 year.   # Osteopenia- Dexa Scan 2016- T-score -1.4.  Clinically stable.  Continue Prolia; due today. Calcium within normal limits. + Patient calcium and vitamin D. BMD- prior to next visit.   # Peripheral neuropathy-stable continue Neurontin.  # follow up in 6 months with labs (labs a few days prior CBC, CMET, CEA, [id number]) and MD and Prolia injection. BMD prior.     No orders of the defined types were placed in this encounter.  All questions were answered. The patient knows to call the clinic with any problems, questions or concerns.      Cammie Sickle, MD 09/07/2017 2:14 PM

## 2017-09-04 LAB — CANCER ANTIGEN 27.29: CA 27.29: 24.4 U/mL (ref 0.0–38.6)

## 2017-09-04 LAB — CEA: CEA1: 11.3 ng/mL — AB (ref 0.0–4.7)

## 2017-09-05 ENCOUNTER — Telehealth: Payer: Self-pay | Admitting: Internal Medicine

## 2017-09-05 NOTE — Telephone Encounter (Signed)
Please inform patient that her tumor marker continues to be slightly elevated/however overall stable over the last 2 years.  No new recommendations; follow-up as planned

## 2017-09-06 NOTE — Telephone Encounter (Signed)
Patient notified of these lab results and Dr. B's recommendations and verbalized understanding.   

## 2017-09-11 IMAGING — US US RENAL
1 series · 15 of 25 positions shown · non-contrast
Comparison: CT abdomen and pelvis 12/28/2010.

CLINICAL DATA: Urinary frequency. History of urinary tract
infection.

EXAM:
RENAL / URINARY TRACT ULTRASOUND COMPLETE

[Series 1: us renal · 15 of 33 slices shown]
[im 1/33]
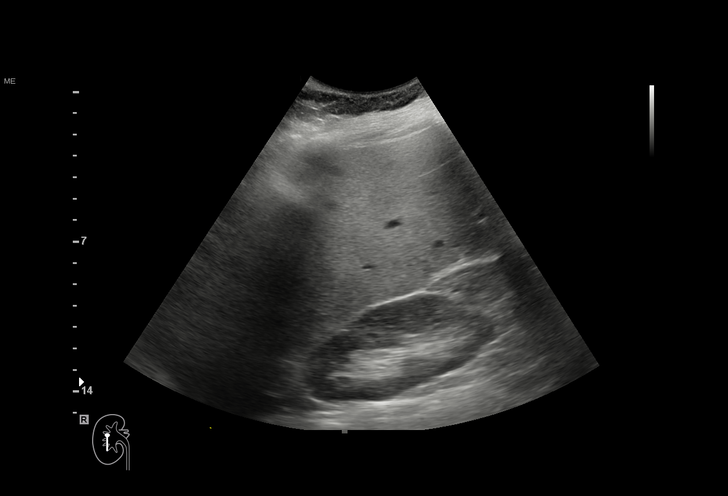
[im 3/33]
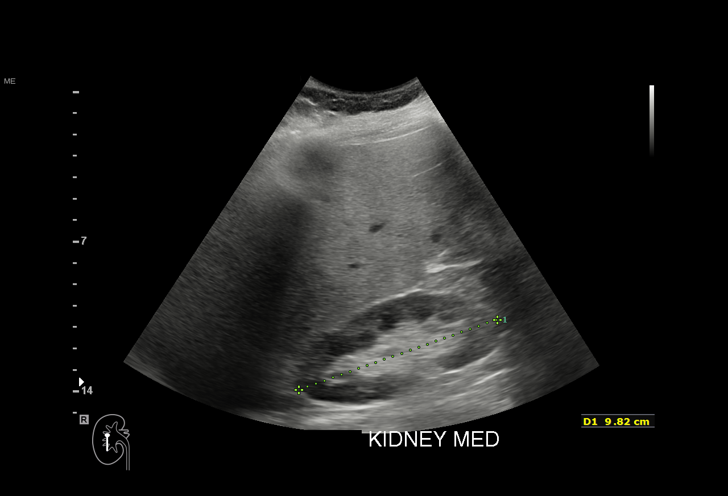
[im 6/33]
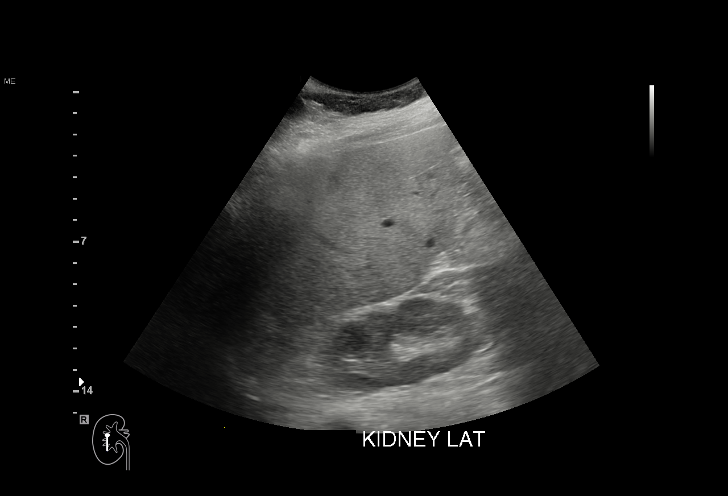
[im 7/33]
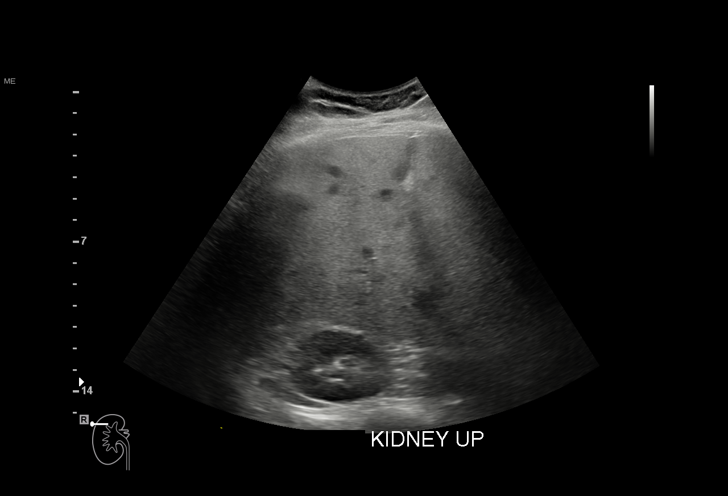
[im 10/33]
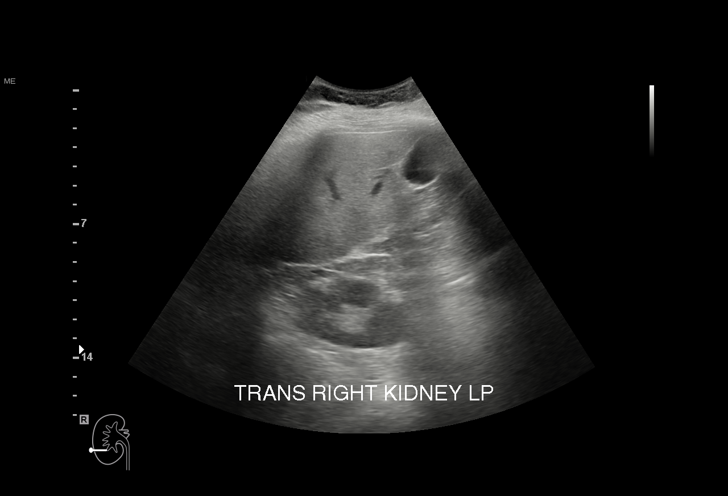
[im 13/33]
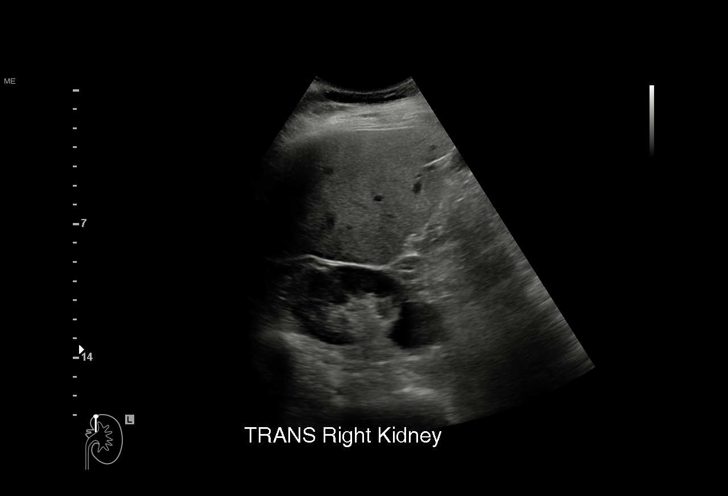
[im 14/33]
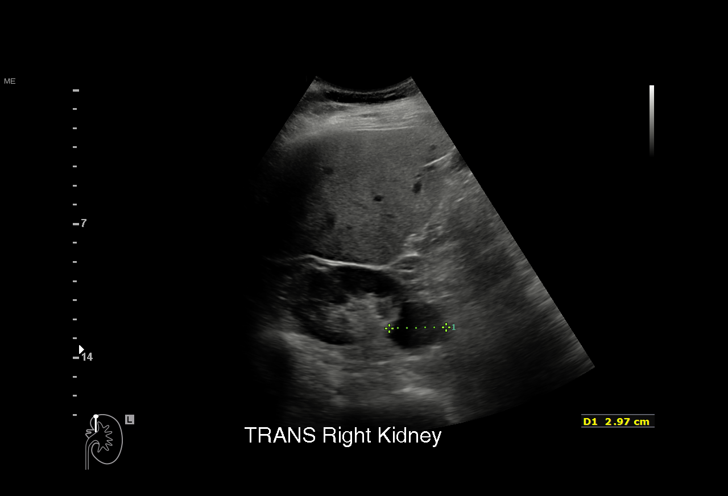
[im 17/33]
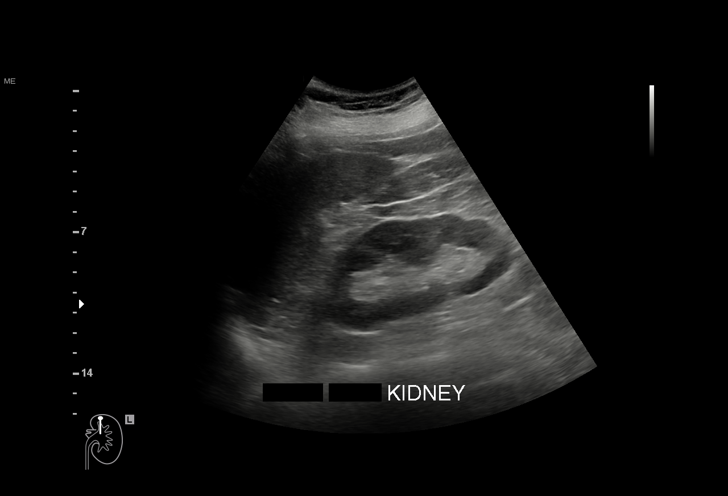
[im 19/33]
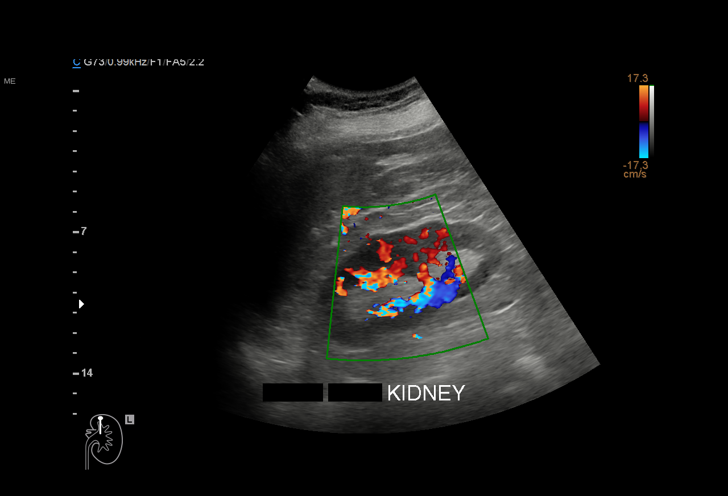
[im 21/33]
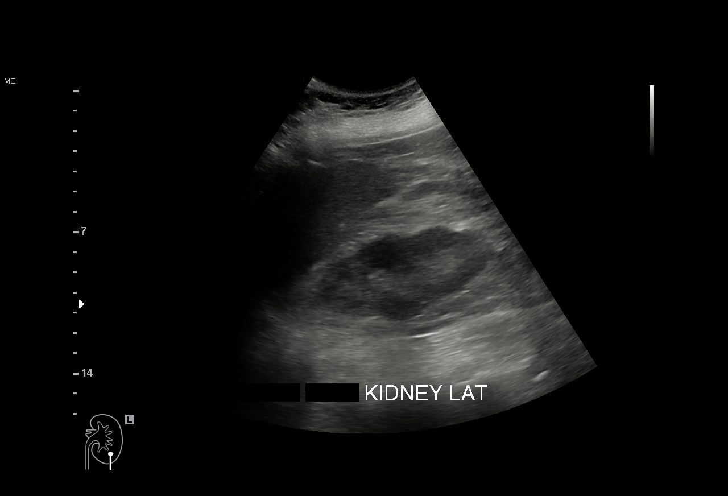
[im 23/33]
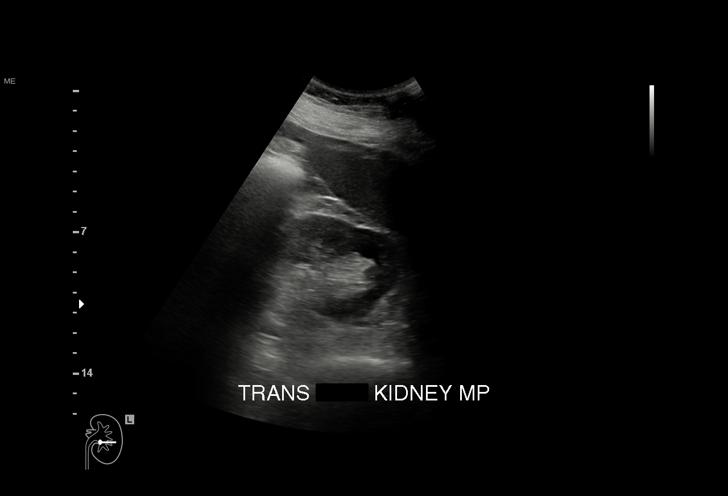
[im 26/33]
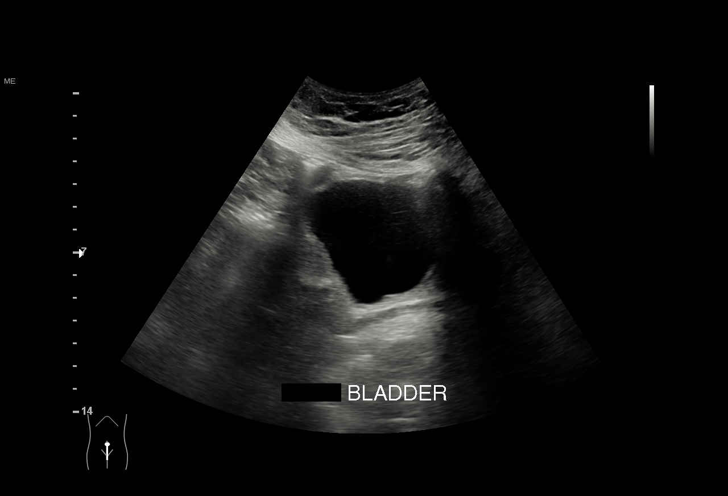
[im 27/33]
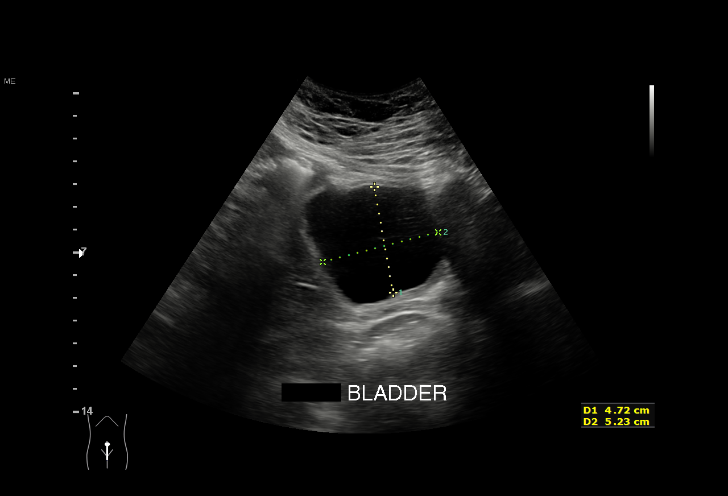
[im 30/33]
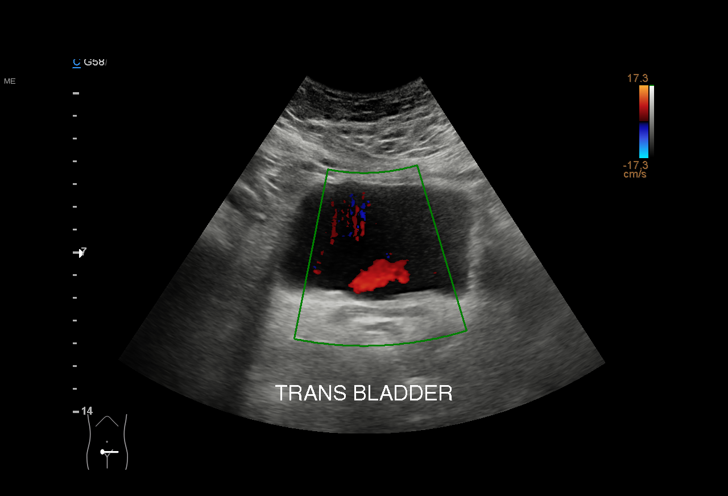
[im 33/33]
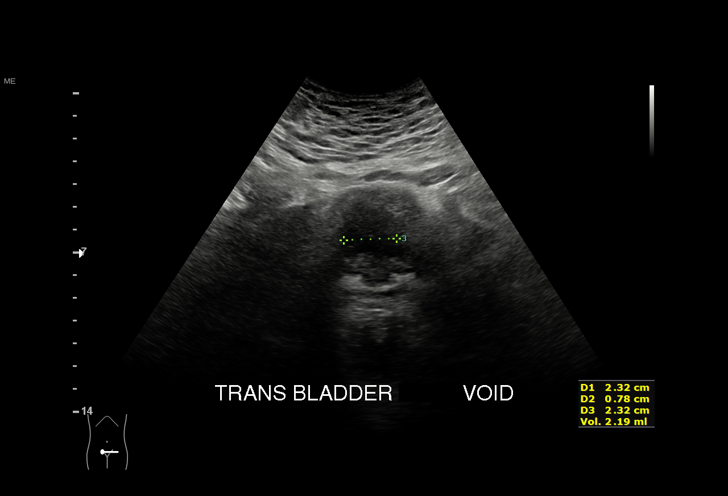

[15 of 25 positions shown; findings below may reference images not displayed]

FINDINGS: Right Kidney:

Length: 9.8 cm. Echogenicity within normal limits. No hydronephrosis
visualized. 2.6 cm simple cyst is noted.

Left Kidney:

Length: 9.5 cm. Echogenicity within normal limits. No mass or
hydronephrosis visualized.

Bladder:

Appears normal for degree of bladder distention.
IMPRESSION: Negative for hydronephrosis. No acute abnormality. Simple right
renal cyst noted.

## 2017-10-03 ENCOUNTER — Other Ambulatory Visit: Payer: Self-pay | Admitting: Internal Medicine

## 2017-10-03 DIAGNOSIS — R1011 Right upper quadrant pain: Secondary | ICD-10-CM

## 2017-10-09 ENCOUNTER — Ambulatory Visit
Admission: RE | Admit: 2017-10-09 | Discharge: 2017-10-09 | Disposition: A | Discharge status: Home / Self Care | Payer: Medicare Other | Source: Ambulatory Visit | Attending: Internal Medicine | Admitting: Internal Medicine

## 2017-10-09 DIAGNOSIS — R1011 Right upper quadrant pain: Secondary | ICD-10-CM | POA: Insufficient documentation

## 2017-10-09 DIAGNOSIS — K760 Fatty (change of) liver, not elsewhere classified: Secondary | ICD-10-CM | POA: Diagnosis not present

## 2017-11-04 ENCOUNTER — Other Ambulatory Visit: Payer: Self-pay

## 2017-11-04 DIAGNOSIS — Z853 Personal history of malignant neoplasm of breast: Secondary | ICD-10-CM

## 2017-11-04 IMAGING — DX DG CERVICAL SPINE COMPLETE 4+V
6 series · 6 of 6 positions shown · non-contrast
Comparison: None.

CLINICAL DATA: Fell last week with dull neck pain

EXAM:
CERVICAL SPINE - COMPLETE 4+ VIEW

[c-spine lat]
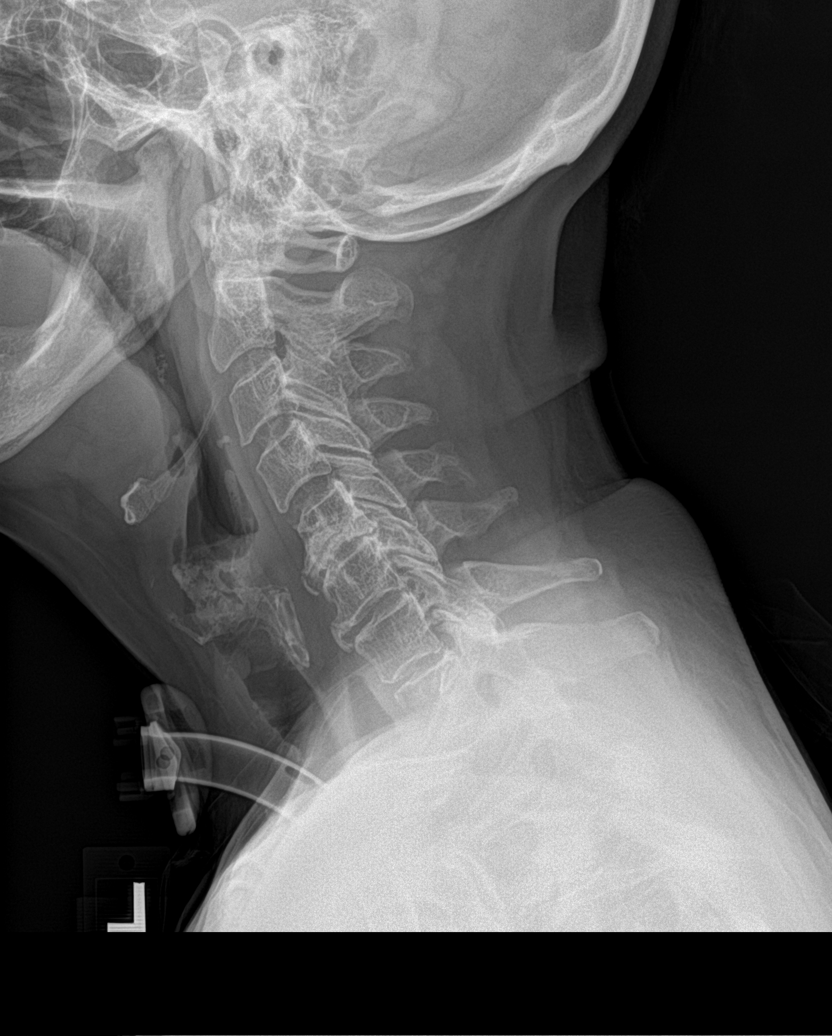

[c-spine obl (1 of 2)]
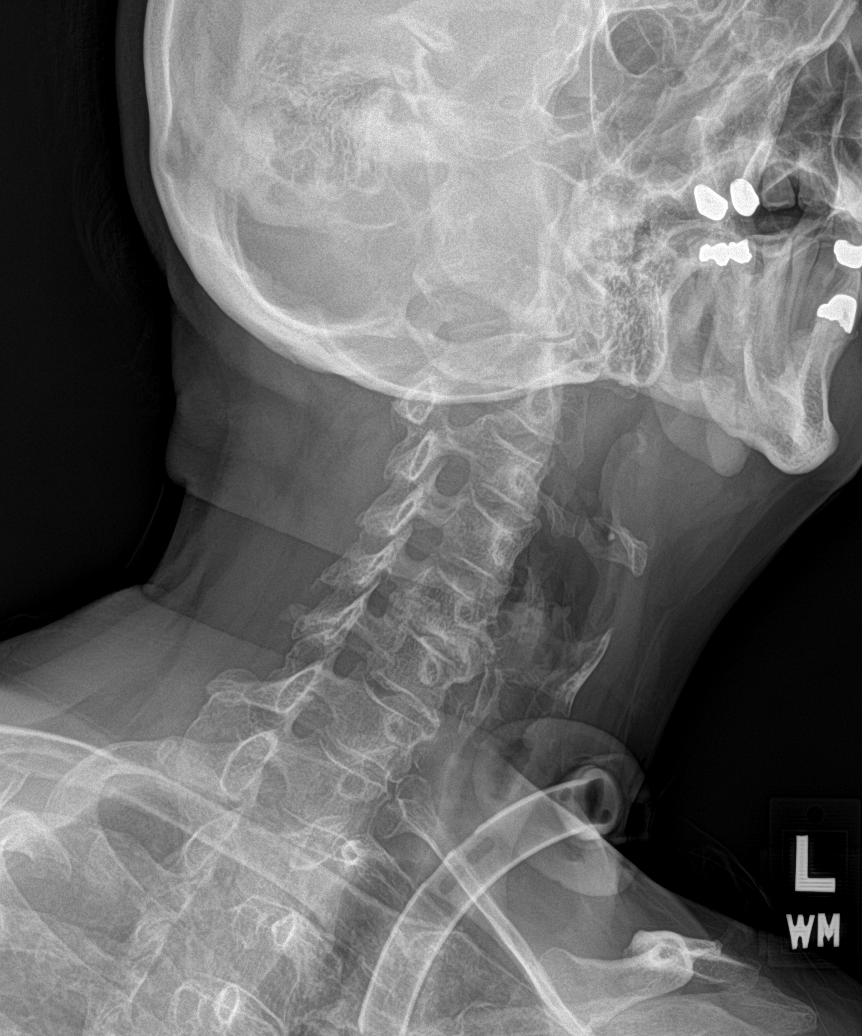

[c-spine obl (2 of 2)]
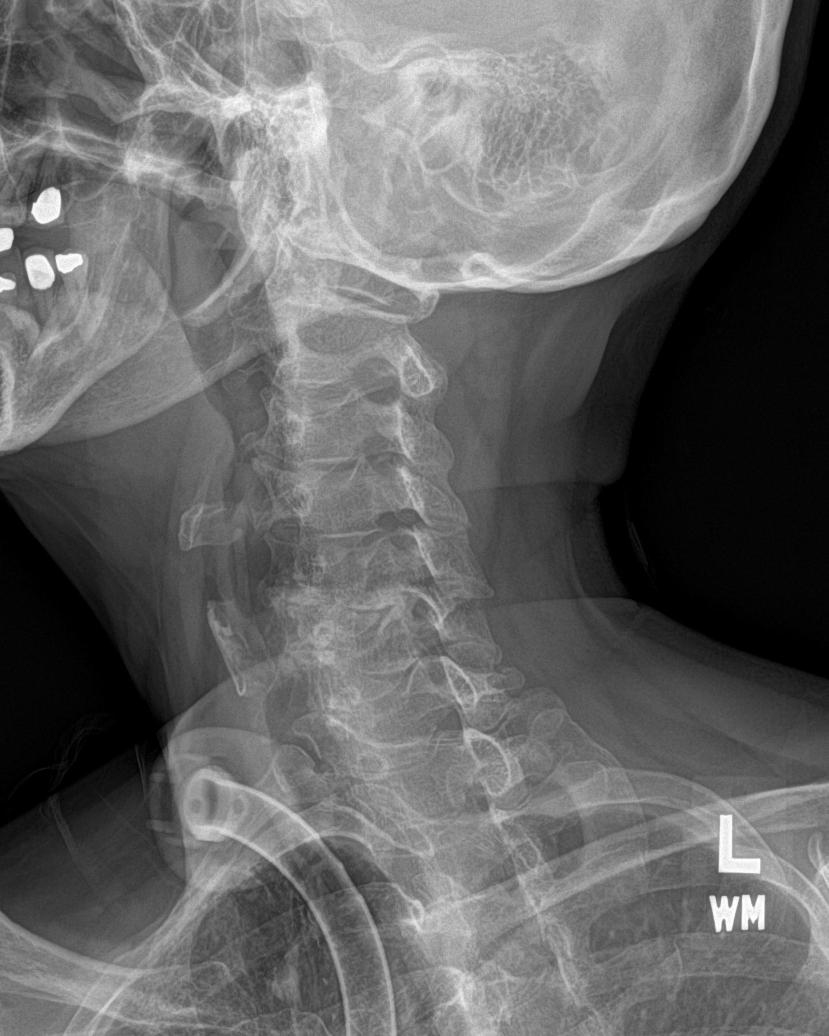

[c-spine ap]
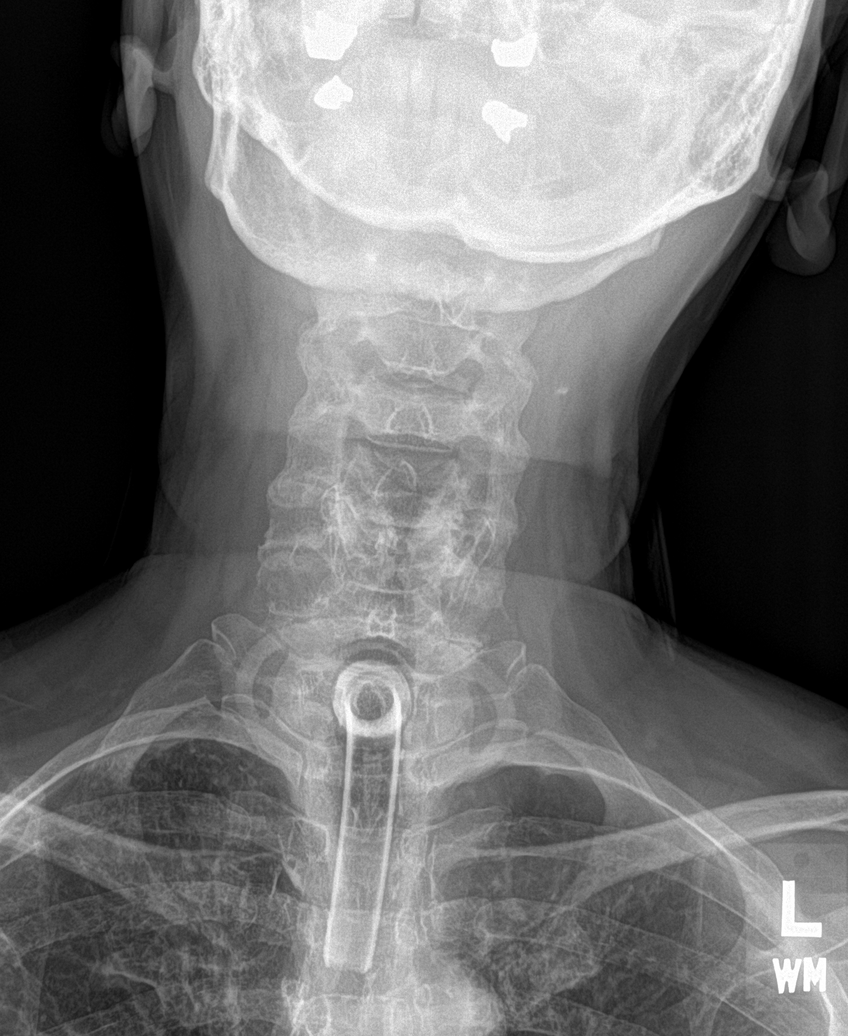

[c-spine open mouth]
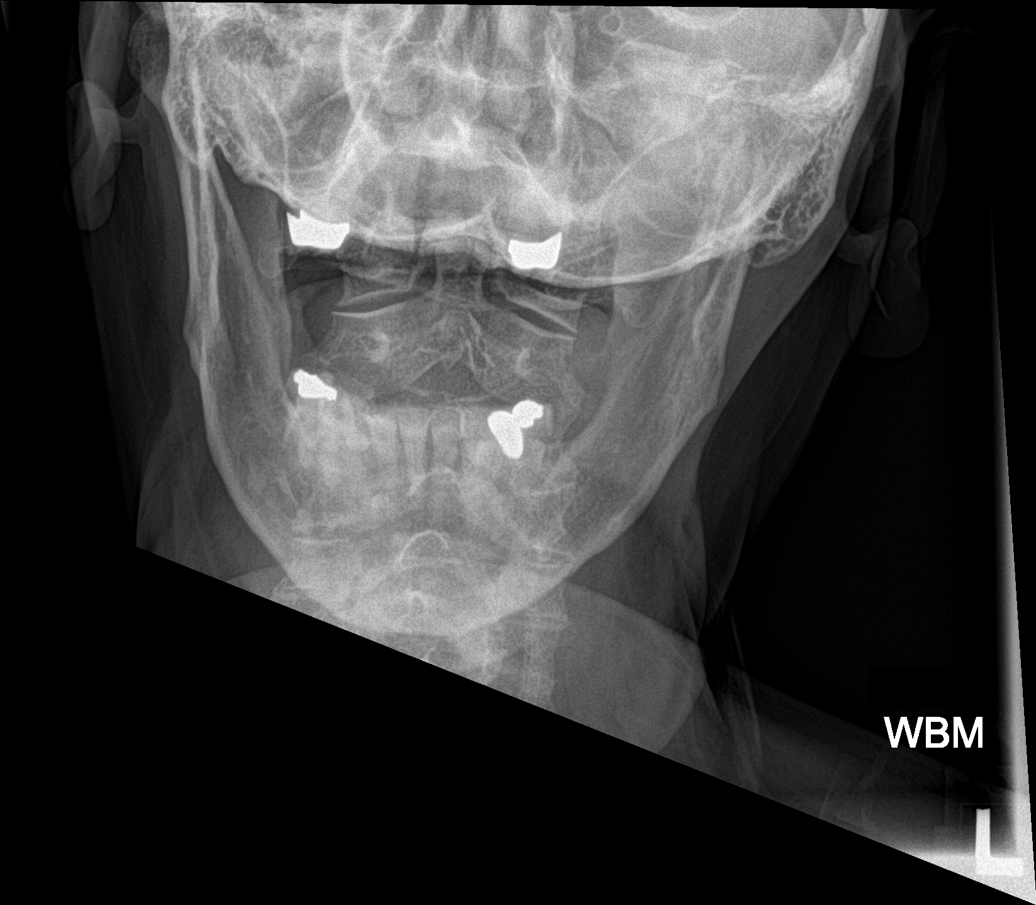

[[person_name]]
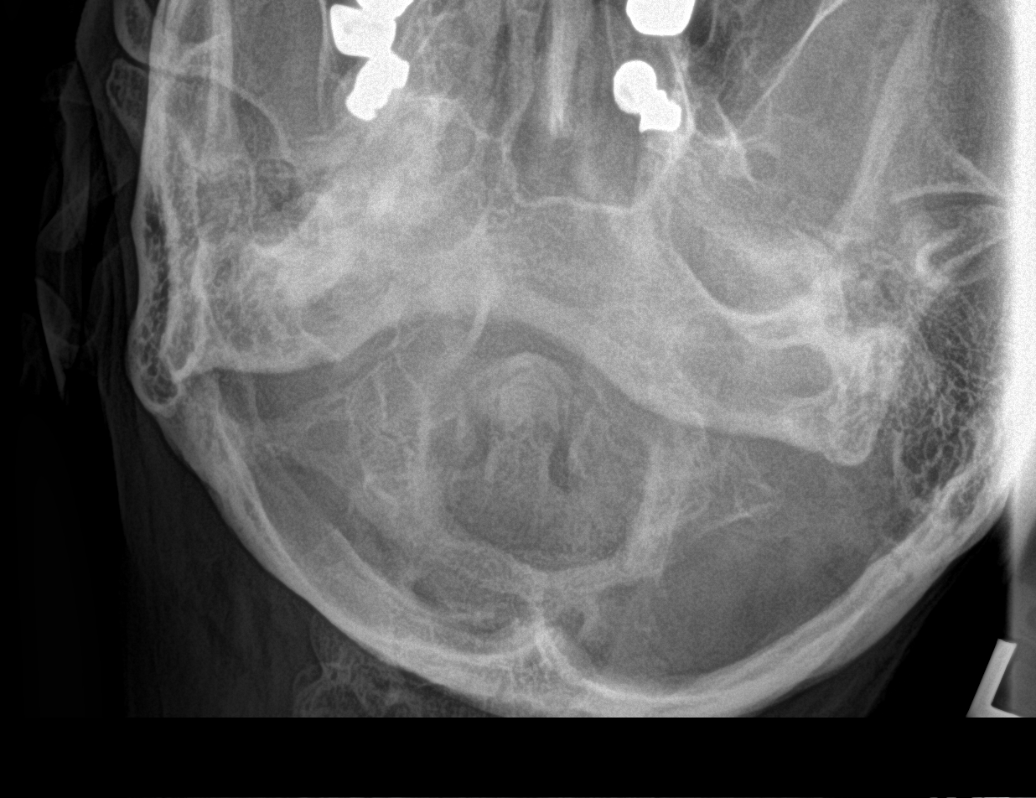

[6 of 6 positions shown; findings below may reference images not displayed]

FINDINGS: The cervical vertebrae are straightened in alignment. There is
degenerative disc disease most marked at C5-6 and also present at
C6-7 where there is loss of disc space and sclerosis with spurring.
No prevertebral soft tissue swelling is seen. On oblique views,
there is moderate foraminal narrowing at C5-6 bilaterally. The
odontoid process is intact. The lung apices are clear. Tracheostomy
is noted.
IMPRESSION: Straightened alignment with degenerative disc disease at C5-6 and to
a lesser degree at C6-7. Foraminal narrowing is noted bilaterally at
C5-6.

## 2017-11-05 ENCOUNTER — Other Ambulatory Visit: Payer: Self-pay | Admitting: General Surgery

## 2017-11-05 DIAGNOSIS — Z1239 Encounter for other screening for malignant neoplasm of breast: Secondary | ICD-10-CM

## 2017-11-12 ENCOUNTER — Other Ambulatory Visit: Payer: Self-pay | Admitting: Gastroenterology

## 2017-11-12 DIAGNOSIS — R1011 Right upper quadrant pain: Secondary | ICD-10-CM

## 2017-11-27 ENCOUNTER — Encounter
Admission: RE | Admit: 2017-11-27 | Discharge: 2017-11-27 | Disposition: A | Discharge status: Home / Self Care | Payer: Medicare Other | Source: Ambulatory Visit | Attending: Gastroenterology | Admitting: Gastroenterology

## 2017-11-27 DIAGNOSIS — R1011 Right upper quadrant pain: Secondary | ICD-10-CM | POA: Insufficient documentation

## 2017-11-27 MED ORDER — TECHNETIUM TC 99M MEBROFENIN IV KIT
5.4400 | PACK | Freq: Once | INTRAVENOUS | Status: AC | PRN
Start: 2017-11-27 — End: 2017-11-27
  Administered 2017-11-27: 5.44 via INTRAVENOUS

## 2017-12-04 ENCOUNTER — Ambulatory Visit: Payer: Self-pay | Admitting: Surgery

## 2017-12-04 NOTE — H&P (Signed)
Subjective:   CC: Biliary dyskinesia [K82.8]  HPI:  Angela David is a 77 y.o. female who was referred by [name],* for evaluation of above CC. Symptoms were first noted several years ago. Pain is dull and constant, confined to the RUQ, without radiation.  Associated with nothing else like nausea/vomiting/change in BM, exacerbated by nothing specific     Past Medical History:  has a past medical history of Breast cancer (CMS-HCC), Diabetes mellitus type 2, uncomplicated (CMS-HCC), Hyperlipidemia, Neuropathy, and Osteoarthritis.  Past Surgical History:  has a past surgical history that includes Tracheostomy and Mastectomy partial / lumpectomy.  Family History: family history includes Bone cancer in her father; COPD in her mother; Prostate cancer in her father.  Social History:  reports that she has never smoked. She has never used smokeless tobacco. She reports that she does not drink alcohol or use drugs.  Current Medications: has a current medication list which includes the following prescription(s): buspirone, cyclosporine, escitalopram oxalate, ferrous sulfate, folic acid, gabapentin, gabapentin, hydrochlorothiazide, lorazepam, lovastatin, metformin, metoprolol tartrate, nortriptyline, pantoprazole, pantoprazole, and pioglitazone.  Allergies:       Allergies as of 12/02/2017 - Reviewed 12/02/2017  Allergen Reaction Noted  . Egg Anaphylaxis and Rash 09/15/2014  . Egg derived Other (See Comments) 09/15/2014  . Shellfish containing products Other (See Comments) 09/15/2014  . Sodium sulfite Other (See Comments) 09/15/2014  . Sulfite Other (See Comments) 09/15/2014  . Codeine Rash 09/15/2014    ROS:  A 15 point review of systems was performed and pertinent positives and negatives noted in HPI    Objective:   BP 94/64   Pulse 75   Ht 165.1 cm (5\' 5" )   Wt 74.8 kg (164 lb 14.5 oz)   BMI 27.44 kg/m    Constitutional :  alert, appears stated age,  cooperative and no distress  Lymphatics/Throat:  no asymmetry, masses, or scars  Respiratory:  clear to auscultation bilaterally  Cardiovascular:  regular rate and rhythm  Gastrointestinal: soft, no guarding, but tenderness to palpation in RUQ.    Musculoskeletal: Steady gait and movement  Skin: Cool and moist,   Psychiatric: Normal affect, non-agitated, not confused       LABS:  N/A   RADS: CLINICAL DATA:Abdominal pain  EXAM: NUCLEAR MEDICINE HEPATOBILIARY IMAGING WITH GALLBLADDER EF  VIEWS: Anterior right upper quadrant  RADIOPHARMACEUTICALS:5.44 mCi Tc-26mCholetec IV  COMPARISON:None.  FINDINGS: Liver uptake of radiotracer is unremarkable. There is prompt visualization of gallbladder and small bowel, indicating patency of the cystic and common bile ducts. The patient consumed 8 ounces of Ensure orally with calculation of the computer generated ejection fraction of radiotracer from the gallbladder. Patient did not report clinical symptoms with the oral Ensure consumption. The computer generated ejection fraction of radiotracer from the gallbladder is diminished at 18%, normal greater than 33% using the oral agent.  IMPRESSION: Abnormally low ejection fraction of radiotracer from gallbladder, a finding felt to be indicative of biliary dyskinesia. The patient did not report clinical symptoms with the oral Ensure consumption. Cystic and common bile ducts are patent as is evidenced by visualization of gallbladder and small bowel.   Electronically Signed By: [name] M.D. On: 11/27/2017 12:20  CLINICAL DATA:Right upper quadrant pain for more than 10 years, worsening in the last month  EXAM: [place]  COMPARISON:12/28/2010 abdominal CT  FINDINGS: Gallbladder:  No gallstones or wall thickening visualized. No sonographic Murphy sign noted by sonographer.  Common bile duct:  Diameter:  3  mm  Liver:  Echogenic liver with subtle sparing at the gallbladder fossa. No evidence of mass lesion. Portal vein is patent on color Doppler imaging with normal direction of blood flow towards the liver.  IMPRESSION: 1. Hepatic steatosis. 2. Normal gallbladder.   Electronically Signed By: [name] M.D. On: 10/09/2017 14:21 Assessment:      Biliary dyskinesia [K82.8]  Plan:   1. Biliary dyskinesia [K82.8] Discussed the risk of surgery including post-op infxn, seroma, biloma, chronic pain, poor-delayed wound healing, retained gallstone, conversion to open procedure, post-op SBO or ileus, and need for additional procedures to address said risks.  The risks of general anesthetic including MI, CVA, sudden death or even reaction to anesthetic medications also discussed. Alternatives include continued observation.  Benefits include possible symptom relief, prevention of complications including acute cholecystitis, pancreatitis.  Typical post operative recovery of 3-5 days rest, continued pain in area and incision sites, possible loose stools up to 4-6 weeks, also discussed.  ED return precautions given for sudden increase in RUQ pain, with possible accompanying fever, nausea, and/or vomiting.  The patient understands the risks, any and all questions were answered to the patient's satisfaction.  2. With the localized RUQ pain and abnormal HIDA, I recommended patient proceed with removal to see if symptoms resolve and also to possibly prevent future acute exacerbations of pain.  She sees Dr. Bary Castilla for other issues but stated she will like to have lap chole performed by me.  Patient has elected to proceed with surgical treatment. Procedure will be scheduled.  Written consent was obtained.    Electronically signed by Benjamine Sprague, DO on 12/02/2017 2:06 PM

## 2017-12-04 NOTE — H&P (View-Only) (Signed)
Subjective:   CC: Biliary dyskinesia [K82.8]  HPI:  Angela David is a 77 y.o. female who was referred by [name],* for evaluation of above CC. Symptoms were first noted several years ago. Pain is dull and constant, confined to the RUQ, without radiation.  Associated with nothing else like nausea/vomiting/change in BM, exacerbated by nothing specific     Past Medical History:  has a past medical history of Breast cancer (CMS-HCC), Diabetes mellitus type 2, uncomplicated (CMS-HCC), Hyperlipidemia, Neuropathy, and Osteoarthritis.  Past Surgical History:  has a past surgical history that includes Tracheostomy and Mastectomy partial / lumpectomy.  Family History: family history includes Bone cancer in her father; COPD in her mother; Prostate cancer in her father.  Social History:  reports that she has never smoked. She has never used smokeless tobacco. She reports that she does not drink alcohol or use drugs.  Current Medications: has a current medication list which includes the following prescription(s): buspirone, cyclosporine, escitalopram oxalate, ferrous sulfate, folic acid, gabapentin, gabapentin, hydrochlorothiazide, lorazepam, lovastatin, metformin, metoprolol tartrate, nortriptyline, pantoprazole, pantoprazole, and pioglitazone.  Allergies:       Allergies as of 12/02/2017 - Reviewed 12/02/2017  Allergen Reaction Noted  . Egg Anaphylaxis and Rash 09/15/2014  . Egg derived Other (See Comments) 09/15/2014  . Shellfish containing products Other (See Comments) 09/15/2014  . Sodium sulfite Other (See Comments) 09/15/2014  . Sulfite Other (See Comments) 09/15/2014  . Codeine Rash 09/15/2014    ROS:  A 15 point review of systems was performed and pertinent positives and negatives noted in HPI    Objective:   BP 94/64   Pulse 75   Ht 165.1 cm (5\' 5" )   Wt 74.8 kg (164 lb 14.5 oz)   BMI 27.44 kg/m    Constitutional :  alert, appears stated age,  cooperative and no distress  Lymphatics/Throat:  no asymmetry, masses, or scars  Respiratory:  clear to auscultation bilaterally  Cardiovascular:  regular rate and rhythm  Gastrointestinal: soft, no guarding, but tenderness to palpation in RUQ.    Musculoskeletal: Steady gait and movement  Skin: Cool and moist,   Psychiatric: Normal affect, non-agitated, not confused       LABS:  N/A   RADS: CLINICAL DATA:Abdominal pain  EXAM: NUCLEAR MEDICINE HEPATOBILIARY IMAGING WITH GALLBLADDER EF  VIEWS: Anterior right upper quadrant  RADIOPHARMACEUTICALS:5.44 mCi Tc-60mCholetec IV  COMPARISON:None.  FINDINGS: Liver uptake of radiotracer is unremarkable. There is prompt visualization of gallbladder and small bowel, indicating patency of the cystic and common bile ducts. The patient consumed 8 ounces of Ensure orally with calculation of the computer generated ejection fraction of radiotracer from the gallbladder. Patient did not report clinical symptoms with the oral Ensure consumption. The computer generated ejection fraction of radiotracer from the gallbladder is diminished at 18%, normal greater than 33% using the oral agent.  IMPRESSION: Abnormally low ejection fraction of radiotracer from gallbladder, a finding felt to be indicative of biliary dyskinesia. The patient did not report clinical symptoms with the oral Ensure consumption. Cystic and common bile ducts are patent as is evidenced by visualization of gallbladder and small bowel.   Electronically Signed By: [name] M.D. On: 11/27/2017 12:20  CLINICAL DATA:Right upper quadrant pain for more than 10 years, worsening in the last month  EXAM: [name]  COMPARISON:12/28/2010 abdominal CT  FINDINGS: Gallbladder:  No gallstones or wall thickening visualized. No sonographic Murphy sign noted by sonographer.  Common bile duct:  Diameter:  3  mm  Liver:  Echogenic liver with subtle sparing at the gallbladder fossa. No evidence of mass lesion. Portal vein is patent on color Doppler imaging with normal direction of blood flow towards the liver.  IMPRESSION: 1. Hepatic steatosis. 2. Normal gallbladder.   Electronically Signed By: [name] M.D. On: 10/09/2017 14:21 Assessment:      Biliary dyskinesia [K82.8]  Plan:   1. Biliary dyskinesia [K82.8] Discussed the risk of surgery including post-op infxn, seroma, biloma, chronic pain, poor-delayed wound healing, retained gallstone, conversion to open procedure, post-op SBO or ileus, and need for additional procedures to address said risks.  The risks of general anesthetic including MI, CVA, sudden death or even reaction to anesthetic medications also discussed. Alternatives include continued observation.  Benefits include possible symptom relief, prevention of complications including acute cholecystitis, pancreatitis.  Typical post operative recovery of 3-5 days rest, continued pain in area and incision sites, possible loose stools up to 4-6 weeks, also discussed.  ED return precautions given for sudden increase in RUQ pain, with possible accompanying fever, nausea, and/or vomiting.  The patient understands the risks, any and all questions were answered to the patient's satisfaction.  2. With the localized RUQ pain and abnormal HIDA, I recommended patient proceed with removal to see if symptoms resolve and also to possibly prevent future acute exacerbations of pain.  She sees Dr. Bary Castilla for other issues but stated she will like to have lap chole performed by me.  Patient has elected to proceed with surgical treatment. Procedure will be scheduled.  Written consent was obtained.    Electronically signed by Benjamine Sprague, DO on 12/02/2017 2:06 PM

## 2017-12-06 ENCOUNTER — Other Ambulatory Visit: Payer: Self-pay

## 2017-12-06 ENCOUNTER — Inpatient Hospital Stay: Admission: RE | Admit: 2017-12-06 | Payer: Medicare Other | Source: Ambulatory Visit

## 2017-12-06 ENCOUNTER — Encounter
Admission: RE | Admit: 2017-12-06 | Discharge: 2017-12-06 | Disposition: A | Discharge status: Home / Self Care | Payer: Medicare Other | Source: Ambulatory Visit | Attending: Surgery | Admitting: Surgery

## 2017-12-06 DIAGNOSIS — Z01818 Encounter for other preprocedural examination: Secondary | ICD-10-CM | POA: Insufficient documentation

## 2017-12-06 HISTORY — DX: Gastro-esophageal reflux disease without esophagitis: K21.9

## 2017-12-06 HISTORY — DX: Paralysis of vocal cords and larynx, unspecified: J38.00

## 2017-12-06 NOTE — Patient Instructions (Signed)
  Your procedure is scheduled on: Tuesday December 10, 2017 Report to Same Day Surgery 2nd floor [facility] Entrance-take elevator on left to 2nd floor.  Check in with surgery information desk.) To find out your arrival time, call [phone] 1:00-3:00 PM on Monday December 09, 2017  Remember: Instructions that are not followed completely may result in serious medical risk, up to and including death, or upon the discretion of your surgeon and anesthesiologist your surgery may need to be rescheduled.    __x__ 1. Do not eat food (including mints, candies, chewing gum) after midnight the night before your procedure. You may drink water up to 2 hours before you are scheduled to arrive at the hospital for your procedure.  Do not drink anything within 2 hours of your scheduled arrival to the hospital.    __x__ 2. No Alcohol for 24 hours before or after surgery.   __x__ 3. No Smoking or e-cigarettes for 24 hours before surgery.  Do not use any chewable tobacco products for at least 6 hours before surgery.   __x__ 4. Notify your doctor if there is any change in your medical condition (cold, fever, infections).   __x__ 5. On the morning of surgery brush your teeth with toothpaste and water.  You may rinse your mouth with mouthwash if you wish.  Do not swallow any toothpaste or mouthwash.  Please read over the following fact sheets that you were given:   [facility] Preparing for Surgery and/or MRSA Information    __x__ Use CHG Soap or Sage wipes as directed on instruction sheet   Do not wear jewelry, make-up, hairpins, clips or nail polish on the day of surgery.  Do not wear lotions, powders, deodorant, or perfumes.   Do not shave below the face/neck 48 hours prior to surgery.   Do not bring valuables to the hospital.    [facility] is not responsible for any belongings or valuables.               Contacts, dentures or bridgework may not be worn into surgery.  For patients  discharged on the day of surgery, you will NOT be permitted to drive yourself home.  You must have a responsible adult with you for 24 hours after surgery.  __x__ Take anti-hypertensive listed below, cardiac, seizure, asthma, anti-reflux and psychiatric medicines. These include:  1. None on the day of surgery (since you will not be eating after midnight)  __x__ Stop Metformin 2 days before surgery (Last dose Saturday December 07, 2017).    __x__ Follow recommendations from Cardiologist, Pulmonologist or PCP regarding stopping Aspirin, Coumadin, Plavix, Eliquis, Effient, Pradaxa, and Pletal.  __x__ Stop Anti-inflammatories such as Advil, Ibuprofen, Motrin, Aleve, Naproxen, Naprosyn, BC/Goodies powders or aspirin products. You may continue to take Tylenol and Celebrex.   __x__ NOW: Stop supplements (Fish Oil, Biotin) until after surgery. You may continue to take Vitamin D, Vitamin B, and multivitamin.

## 2017-12-09 MED ORDER — CEFAZOLIN SODIUM-DEXTROSE 2-4 GM/100ML-% IV SOLN
2.0000 g | INTRAVENOUS | Status: AC
  Administered 2017-12-10: 2 g via INTRAVENOUS

## 2017-12-09 NOTE — Pre-Procedure Instructions (Signed)
CALLED FOR CLEARANCE NOTE FROM DR CALLWOOD. SPOKE WITH BETSY AND REQUEST FORM REFAXED

## 2017-12-10 ENCOUNTER — Ambulatory Visit
Admission: RE | Admit: 2017-12-10 | Discharge: 2017-12-10 | Disposition: A | Discharge status: Home / Self Care | Payer: Medicare Other | Source: Ambulatory Visit | Attending: Surgery | Admitting: Surgery

## 2017-12-10 ENCOUNTER — Ambulatory Visit: Payer: Medicare Other | Admitting: Anesthesiology

## 2017-12-10 ENCOUNTER — Other Ambulatory Visit: Payer: Self-pay

## 2017-12-10 ENCOUNTER — Encounter: Payer: Self-pay | Admitting: *Deleted

## 2017-12-10 ENCOUNTER — Encounter
Admission: RE | Disposition: A | Discharge status: Home / Self Care | Payer: Self-pay | Source: Ambulatory Visit | Attending: Surgery

## 2017-12-10 DIAGNOSIS — K219 Gastro-esophageal reflux disease without esophagitis: Secondary | ICD-10-CM | POA: Diagnosis not present

## 2017-12-10 DIAGNOSIS — K811 Chronic cholecystitis: Secondary | ICD-10-CM | POA: Diagnosis not present

## 2017-12-10 DIAGNOSIS — Z7984 Long term (current) use of oral hypoglycemic drugs: Secondary | ICD-10-CM | POA: Insufficient documentation

## 2017-12-10 DIAGNOSIS — E785 Hyperlipidemia, unspecified: Secondary | ICD-10-CM | POA: Diagnosis not present

## 2017-12-10 DIAGNOSIS — K760 Fatty (change of) liver, not elsewhere classified: Secondary | ICD-10-CM | POA: Insufficient documentation

## 2017-12-10 DIAGNOSIS — E1140 Type 2 diabetes mellitus with diabetic neuropathy, unspecified: Secondary | ICD-10-CM | POA: Insufficient documentation

## 2017-12-10 DIAGNOSIS — G8929 Other chronic pain: Secondary | ICD-10-CM | POA: Insufficient documentation

## 2017-12-10 DIAGNOSIS — K429 Umbilical hernia without obstruction or gangrene: Secondary | ICD-10-CM | POA: Insufficient documentation

## 2017-12-10 DIAGNOSIS — M1990 Unspecified osteoarthritis, unspecified site: Secondary | ICD-10-CM | POA: Insufficient documentation

## 2017-12-10 DIAGNOSIS — R112 Nausea with vomiting, unspecified: Secondary | ICD-10-CM | POA: Diagnosis not present

## 2017-12-10 DIAGNOSIS — K828 Other specified diseases of gallbladder: Secondary | ICD-10-CM

## 2017-12-10 HISTORY — PX: CHOLECYSTECTOMY: SHX55

## 2017-12-10 LAB — GLUCOSE, CAPILLARY
Glucose-Capillary: 152 mg/dL — ABNORMAL HIGH (ref 70–99)
Glucose-Capillary: 158 mg/dL — ABNORMAL HIGH (ref 70–99)

## 2017-12-10 SURGERY — LAPAROSCOPIC CHOLECYSTECTOMY
Anesthesia: General

## 2017-12-10 MED ORDER — CEFAZOLIN SODIUM-DEXTROSE 2-4 GM/100ML-% IV SOLN
INTRAVENOUS | Status: AC
  Filled 2017-12-10: qty 100

## 2017-12-10 MED ORDER — PROPOFOL 10 MG/ML IV BOLUS
INTRAVENOUS | Status: DC | PRN
  Administered 2017-12-10: 100 mg via INTRAVENOUS
  Administered 2017-12-10: 50 mg via INTRAVENOUS

## 2017-12-10 MED ORDER — IBUPROFEN 800 MG PO TABS: 800.0000 mg | ORAL_TABLET | Freq: Three times a day (TID) | ORAL | 0 refills | Status: DC | PRN

## 2017-12-10 MED ORDER — ONDANSETRON HCL 4 MG/2ML IJ SOLN: 4.0000 mg | Freq: Once | INTRAMUSCULAR | Status: DC | PRN

## 2017-12-10 MED ORDER — ROCURONIUM BROMIDE 100 MG/10ML IV SOLN
INTRAVENOUS | Status: DC | PRN
  Administered 2017-12-10: 30 mg via INTRAVENOUS

## 2017-12-10 MED ORDER — LABETALOL HCL 5 MG/ML IV SOLN
INTRAVENOUS | Status: DC | PRN
  Administered 2017-12-10: 10 mg via INTRAVENOUS

## 2017-12-10 MED ORDER — FENTANYL CITRATE (PF) 100 MCG/2ML IJ SOLN
INTRAMUSCULAR | Status: AC
  Administered 2017-12-10: 50 ug via INTRAVENOUS
  Filled 2017-12-10: qty 2

## 2017-12-10 MED ORDER — FENTANYL CITRATE (PF) 100 MCG/2ML IJ SOLN
INTRAMUSCULAR | Status: AC
  Filled 2017-12-10: qty 2

## 2017-12-10 MED ORDER — LIDOCAINE HCL (CARDIAC) PF 100 MG/5ML IV SOSY
PREFILLED_SYRINGE | INTRAVENOUS | Status: DC | PRN
  Administered 2017-12-10: 50 mg via INTRATRACHEAL

## 2017-12-10 MED ORDER — CELECOXIB 200 MG PO CAPS: 200.0000 mg | ORAL_CAPSULE | ORAL | Status: DC

## 2017-12-10 MED ORDER — FAMOTIDINE 20 MG PO TABS: 20.0000 mg | ORAL_TABLET | Freq: Once | ORAL | Status: DC

## 2017-12-10 MED ORDER — LACTATED RINGERS IV SOLN
INTRAVENOUS | Status: DC | PRN
  Administered 2017-12-10 (×2): via INTRAVENOUS

## 2017-12-10 MED ORDER — FENTANYL CITRATE (PF) 100 MCG/2ML IJ SOLN
INTRAMUSCULAR | Status: AC
  Administered 2017-12-10: 25 ug via INTRAVENOUS
  Filled 2017-12-10: qty 2

## 2017-12-10 MED ORDER — SUGAMMADEX SODIUM 200 MG/2ML IV SOLN
INTRAVENOUS | Status: AC
  Filled 2017-12-10: qty 2

## 2017-12-10 MED ORDER — TRAMADOL HCL 50 MG PO TABS: 50.0000 mg | ORAL_TABLET | Freq: Four times a day (QID) | ORAL | 0 refills | Status: DC | PRN

## 2017-12-10 MED ORDER — MEPERIDINE HCL 50 MG/ML IJ SOLN: 6.2500 mg | INTRAMUSCULAR | Status: DC | PRN

## 2017-12-10 MED ORDER — DOCUSATE SODIUM 100 MG PO CAPS: 100.0000 mg | ORAL_CAPSULE | Freq: Two times a day (BID) | ORAL | 0 refills | Status: AC | PRN

## 2017-12-10 MED ORDER — BUPIVACAINE HCL (PF) 0.5 % IJ SOLN
INTRAMUSCULAR | Status: AC
  Filled 2017-12-10: qty 30

## 2017-12-10 MED ORDER — LABETALOL HCL 5 MG/ML IV SOLN
INTRAVENOUS | Status: AC
  Filled 2017-12-10: qty 4

## 2017-12-10 MED ORDER — PHENYLEPHRINE HCL 10 MG/ML IJ SOLN
INTRAMUSCULAR | Status: DC | PRN
  Administered 2017-12-10: 200 ug via INTRAVENOUS

## 2017-12-10 MED ORDER — DEXAMETHASONE SODIUM PHOSPHATE 10 MG/ML IJ SOLN
INTRAMUSCULAR | Status: AC
  Filled 2017-12-10: qty 1

## 2017-12-10 MED ORDER — SODIUM CHLORIDE 0.9 % IV SOLN: INTRAVENOUS | Status: DC

## 2017-12-10 MED ORDER — SUGAMMADEX SODIUM 200 MG/2ML IV SOLN
INTRAVENOUS | Status: DC | PRN
  Administered 2017-12-10: 150 mg via INTRAVENOUS

## 2017-12-10 MED ORDER — KETOROLAC TROMETHAMINE 30 MG/ML IJ SOLN
INTRAMUSCULAR | Status: AC
  Filled 2017-12-10: qty 1

## 2017-12-10 MED ORDER — IBUPROFEN 800 MG PO TABS
ORAL_TABLET | ORAL | Status: AC
  Administered 2017-12-10: 800 mg via ORAL
  Filled 2017-12-10: qty 1

## 2017-12-10 MED ORDER — IBUPROFEN 800 MG PO TABS
800.0000 mg | ORAL_TABLET | Freq: Once | ORAL | Status: AC
  Administered 2017-12-10: 800 mg via ORAL
  Filled 2017-12-10: qty 1

## 2017-12-10 MED ORDER — ROCURONIUM BROMIDE 50 MG/5ML IV SOLN
INTRAVENOUS | Status: AC
  Filled 2017-12-10: qty 1

## 2017-12-10 MED ORDER — PHENYLEPHRINE HCL 10 MG/ML IJ SOLN
INTRAMUSCULAR | Status: AC
  Filled 2017-12-10: qty 1

## 2017-12-10 MED ORDER — ACETAMINOPHEN 500 MG PO TABS: 1000.0000 mg | ORAL_TABLET | ORAL | Status: DC

## 2017-12-10 MED ORDER — PROPOFOL 10 MG/ML IV BOLUS
INTRAVENOUS | Status: AC
  Filled 2017-12-10: qty 40

## 2017-12-10 MED ORDER — FENTANYL CITRATE (PF) 100 MCG/2ML IJ SOLN
INTRAMUSCULAR | Status: DC | PRN
  Administered 2017-12-10: 25 ug via INTRAVENOUS
  Administered 2017-12-10: 50 ug via INTRAVENOUS
  Administered 2017-12-10: 75 ug via INTRAVENOUS

## 2017-12-10 MED ORDER — LIDOCAINE HCL (PF) 2 % IJ SOLN
INTRAMUSCULAR | Status: AC
  Filled 2017-12-10: qty 10

## 2017-12-10 MED ORDER — FENTANYL CITRATE (PF) 100 MCG/2ML IJ SOLN
25.0000 ug | INTRAMUSCULAR | Status: DC | PRN
  Administered 2017-12-10: 25 ug via INTRAVENOUS
  Administered 2017-12-10 (×2): 50 ug via INTRAVENOUS
  Administered 2017-12-10: 25 ug via INTRAVENOUS

## 2017-12-10 MED ORDER — ACETAMINOPHEN 325 MG PO TABS: 650.0000 mg | ORAL_TABLET | Freq: Three times a day (TID) | ORAL | 0 refills | Status: AC | PRN

## 2017-12-10 MED ORDER — ONDANSETRON HCL 4 MG/2ML IJ SOLN
INTRAMUSCULAR | Status: DC | PRN
  Administered 2017-12-10: 4 mg via INTRAVENOUS

## 2017-12-10 MED ORDER — HYDROCODONE-ACETAMINOPHEN 5-325 MG PO TABS
ORAL_TABLET | ORAL | Status: AC
  Filled 2017-12-10: qty 1

## 2017-12-10 MED ORDER — CHLORHEXIDINE GLUCONATE CLOTH 2 % EX PADS: 6.0000 | MEDICATED_PAD | Freq: Once | CUTANEOUS | Status: DC

## 2017-12-10 MED ORDER — LIDOCAINE-EPINEPHRINE (PF) 1 %-1:200000 IJ SOLN
INTRAMUSCULAR | Status: AC
  Filled 2017-12-10: qty 30

## 2017-12-10 MED ORDER — ONDANSETRON HCL 4 MG/2ML IJ SOLN
INTRAMUSCULAR | Status: AC
  Filled 2017-12-10: qty 2

## 2017-12-10 MED ORDER — LIDOCAINE-EPINEPHRINE (PF) 1 %-1:200000 IJ SOLN
INTRAMUSCULAR | Status: DC | PRN
  Administered 2017-12-10: 17 mL via INTRAMUSCULAR

## 2017-12-10 SURGICAL SUPPLY — 58 items
APPLICATOR COTTON TIP 6 STRL (MISCELLANEOUS) IMPLANT
APPLICATOR COTTON TIP 6IN STRL (MISCELLANEOUS)
APPLIER CLIP 5 13 M/L LIGAMAX5 (MISCELLANEOUS) ×2
BLADE SURG SZ11 CARB STEEL (BLADE) ×2 IMPLANT
CANISTER SUCT 1200ML W/VALVE (MISCELLANEOUS) ×2 IMPLANT
CHLORAPREP W/TINT 26ML (MISCELLANEOUS) ×2 IMPLANT
CHOLANGIOGRAM CATH TAUT (CATHETERS) IMPLANT
CLIP APPLIE 5 13 M/L LIGAMAX5 (MISCELLANEOUS) ×1 IMPLANT
COVER WAND RF STERILE (DRAPES) IMPLANT
DECANTER SPIKE VIAL GLASS SM (MISCELLANEOUS) ×4 IMPLANT
DEFOGGER SCOPE WARMER CLEARIFY (MISCELLANEOUS) IMPLANT
DERMABOND ADVANCED (GAUZE/BANDAGES/DRESSINGS) ×1
DERMABOND ADVANCED .7 DNX12 (GAUZE/BANDAGES/DRESSINGS) ×1 IMPLANT
DISSECTOR BLUNT TIP ENDO 5MM (MISCELLANEOUS) IMPLANT
DISSECTOR KITTNER STICK (MISCELLANEOUS) ×1 IMPLANT
DISSECTORS/KITTNER STICK (MISCELLANEOUS) ×2
DRAPE C-ARM XRAY 36X54 (DRAPES) IMPLANT
DRAPE SHEET LG 3/4 BI-LAMINATE (DRAPES) IMPLANT
ELECT CAUTERY BLADE 6.4 (BLADE) IMPLANT
ELECT REM PT RETURN 9FT ADLT (ELECTROSURGICAL) ×2
ELECTRODE REM PT RTRN 9FT ADLT (ELECTROSURGICAL) ×1 IMPLANT
GLOVE BIOGEL PI IND STRL 7.0 (GLOVE) ×4 IMPLANT
GLOVE BIOGEL PI INDICATOR 7.0 (GLOVE) ×4
GLOVE SURG SYN 7.0 (GLOVE) ×8 IMPLANT
GOWN STRL REUS W/ TWL LRG LVL3 (GOWN DISPOSABLE) ×4 IMPLANT
GOWN STRL REUS W/TWL LRG LVL3 (GOWN DISPOSABLE) ×4
GRASPER SUT TROCAR 14GX15 (MISCELLANEOUS) ×2 IMPLANT
IRRIGATION STRYKERFLOW (MISCELLANEOUS) ×1 IMPLANT
IRRIGATOR STRYKERFLOW (MISCELLANEOUS) ×2
IV CATH ANGIO 12GX3 LT BLUE (NEEDLE) IMPLANT
IV NS 1000ML (IV SOLUTION) ×1
IV NS 1000ML BAXH (IV SOLUTION) ×1 IMPLANT
JACKSON PRATT 10 (INSTRUMENTS) IMPLANT
L-HOOK LAP DISP 36CM (ELECTROSURGICAL) ×2
LHOOK LAP DISP 36CM (ELECTROSURGICAL) ×1 IMPLANT
NEEDLE HYPO 22GX1.5 SAFETY (NEEDLE) ×2 IMPLANT
PACK LAP CHOLECYSTECTOMY (MISCELLANEOUS) ×2 IMPLANT
PENCIL ELECTRO HAND CTR (MISCELLANEOUS) ×2 IMPLANT
PORT ACCESS TROCAR AIRSEAL 5 (TROCAR) ×2 IMPLANT
POUCH SPECIMEN RETRIEVAL 10MM (ENDOMECHANICALS) ×2 IMPLANT
SCISSORS METZENBAUM CVD 33 (INSTRUMENTS) ×2 IMPLANT
SET TRI-LUMEN FLTR TB AIRSEAL (TUBING) ×2 IMPLANT
SLEEVE ENDOPATH XCEL 5M (ENDOMECHANICALS) ×2 IMPLANT
SPONGE LAP 18X18 RF (DISPOSABLE) IMPLANT
STOPCOCK 4 WAY LG BORE MALE ST (IV SETS) IMPLANT
SUT MNCRL 4-0 (SUTURE) ×3
SUT MNCRL 4-0 27XMFL (SUTURE) ×3
SUT VIC AB 3-0 SH 27 (SUTURE)
SUT VIC AB 3-0 SH 27X BRD (SUTURE) IMPLANT
SUT VICRYL 0 AB UR-6 (SUTURE) ×4 IMPLANT
SUTURE MNCRL 4-0 27XMF (SUTURE) ×3 IMPLANT
SYR 20CC LL (SYRINGE) IMPLANT
TOWEL OR 17X26 4PK STRL BLUE (TOWEL DISPOSABLE) IMPLANT
TROCAR XCEL BLUNT TIP 100MML (ENDOMECHANICALS) ×2 IMPLANT
TROCAR XCEL NON-BLD 5MMX100MML (ENDOMECHANICALS) ×6 IMPLANT
TUBE TRACH SHILEY 6  FEN UNCF (TUBING) ×1
TUBE TRACH SHILEY 6 74FEN UNCF (TUBING) ×1 IMPLANT
WATER STERILE IRR 1000ML POUR (IV SOLUTION) IMPLANT

## 2017-12-10 NOTE — Anesthesia Procedure Notes (Signed)
Procedure Name: Intubation Date/Time: 12/10/2017 12:30 PM Performed by: Chanetta Marshall, CRNA Pre-anesthesia Checklist: Patient identified, Emergency Drugs available, Suction available and Patient being monitored Oxygen Delivery Method: Circle system utilized Preoxygenation: Pre-oxygenation with 100% oxygen Induction Type: IV induction Tube type: Reinforced (Reinforced ETT placed in mature trach site.) Tube size: 6.0 mm Number of attempts: 1 Placement Confirmation: positive ETCO2,  breath sounds checked- equal and bilateral and CO2 detector Tube secured with: Tape

## 2017-12-10 NOTE — Op Note (Signed)
Preoperative diagnosis:  biliary dyskinesia and umbilical hernia  Postoperative diagnosis: same as above plus umbilical hernia  Procedure: Laparoscopic Cholecystectomy.   Anesthesia: GETA   Surgeon: Benjamine Sprague  Specimen: Gallbladder  Complications: None  EBL: 16mL  Wound Classification: Clean Contaminated  Indications: see HPI  Findings: Critical view of safety noted Cystic duct and artery identified, ligated and divided, clips remained intact at end of procedure Adequate hemostasis  Description of procedure: The patient was placed on the operating table in the supine position. SCDs placed, pre-op abx administered.  General anesthesia was induced and OG tube placed by anesthesia. A time-out was completed verifying correct patient, procedure, site, positioning, and implant(s) and/or special equipment prior to beginning this procedure. The abdomen was prepped and draped in the usual sterile fashion.  An incision was made in a natural skin line under the umbilicus.  Dissection carried down to previously noted umbilical fascia where two 0 vicryl sutures placed to use as anchor sutures for hasson port.  Incision made into hernia sac and blunt dissection used to enter peritoneum.  Hasson port placed and insufflation started up to 31mm Hg without any dramatic increase in pressure.    The laparoscope was inserted and the abdomen inspected. No injuries from initial trocar placement were noted. Additional trocars were then inserted under direct visualization in the following locations: a 5-mm trocar in the subxyphoid region and two 5-mm trocars along the right costal margin. The abdomen was inspected and no abnormalities or injuries were found. The table was placed in the reverse Trendelenburg position with the right side up.  The left lboe was noted to be in front of the gallbladder and infundibulum not able to be visualized.  A liver retractor was placed through another 88mm skin incision in  between the more medial ports and used to retract the lobe away from operative field.    Filmy adhesions between the gallbladder and omentum, duodenum and transverse colon were lysed sharply. The dome of the gallbladder was grasped with an atraumatic grasper passed through the lateral port and retracted over the dome of the liver. The infundibulum was also grasped with an atraumatic grasper and retracted toward the right lower quadrant. This maneuver exposed Calot's triangle. The peritoneum overlying the gallbladder infundibulum was then dissected and the cystic duct and cystic artery identified.  Critical view of safety with the liver bed clearly visible behind the duct and artery with no additional structures noted.  Picture taken before the cystic duct and cystic artery clipped and divided close to the gallbladder.  The gallbladder was then dissected from its peritoneal and liver bed attachments by electrocautery. Hemostasis was checked and the gallbladder was removed using an endoscopic retrieval bag placed through the umbilical port. The gallbladder was passed off the table as a specimen. The gallbladder fossa was copiously irrigated with saline and any leaked bile was suctioned out, and hemostasis was obtained. There was no evidence of bleeding from the gallbladder fossa or cystic artery or leakage of the bile from the cystic duct stump.  Liver retractor removed and no additional bleeding noted and additional irrigation suctioned out.  PMI used to close the umbilical port under direct vision using 0 vicryl with Abdomen desufflated to 79mm.  Abdomen then completely desufflated and secondary trocars were removed under direct vision. No bleeding was noted. The laparoscope was withdrawn and the umbilical trocar removed.   3-0 vicryl used to close deep dermal layer at umbilical site.  All skin incisions then  closed with subcuticular sutures of 4-0 monocryl and dressed with topical skin adhesive. The orogastric  tube was removed and patient extubated. The patient tolerated the procedure well and was taken to the postanesthesia care unit in stable condition.  All sponge and instrument count correct at end of procedure.

## 2017-12-10 NOTE — Anesthesia Preprocedure Evaluation (Addendum)
Anesthesia Evaluation  Patient identified by MRN, date of birth, ID band Patient awake    Reviewed: Allergy & Precautions, NPO status , Patient's Chart, lab work & pertinent test results  History of Anesthesia Complications Negative for: history of anesthetic complications  Airway Mallampati: Trach  TM Distance: >3 FB Neck ROM: Full    Dental no notable dental hx.    Pulmonary neg COPD,    breath sounds clear to auscultation- rhonchi (-) wheezing      Cardiovascular Exercise Tolerance: Good (-) hypertension(-) CAD, (-) Past MI, (-) Cardiac Stents and (-) CABG  Rhythm:Regular Rate:Normal - Systolic murmurs and - Diastolic murmurs    Neuro/Psych  Headaches, PSYCHIATRIC DISORDERS Anxiety Depression    GI/Hepatic Neg liver ROS, GERD  ,  Endo/Other  diabetes, Oral Hypoglycemic Agents  Renal/GU negative Renal ROS     Musculoskeletal negative musculoskeletal ROS (+)   Abdominal (+) - obese,   Peds  Hematology negative hematology ROS (+)   Anesthesia Other Findings Past Medical History: 2002: Breast cancer ([place])     Comment:  right breast chemo and radiation 2002: Cancer ([place])     Comment:  right breast 08/28/2016: Diabetic peripheral neuropathy ([place]) No date: GERD (gastroesophageal reflux disease) No date: Glaucoma No date: High cholesterol No date: History of colon polyps 08/28/2016: Peripheral neuropathy 2002: Personal history of chemotherapy     Comment:  BREAST CA No date: Personal history of malignant neoplasm of breast 2002: Personal history of radiation therapy     Comment:  BREAST CA 2011: Sleep apnea 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   Reproductive/Obstetrics                             Anesthesia Physical Anesthesia Plan  ASA: III  Anesthesia Plan: General   Post-op Pain Management:    Induction: Intravenous  PONV Risk Score and Plan: 2 and Ondansetron, Dexamethasone and  Midazolam  Airway Management Planned: Tracheostomy  Additional Equipment:   Intra-op Plan:   Post-operative Plan: Extubation in OR  Informed Consent: I have reviewed the patients History and Physical, chart, labs and discussed the procedure including the risks, benefits and alternatives for the proposed anesthesia with the patient or authorized representative who has indicated his/her understanding and acceptance.   Dental advisory given  Plan Discussed with: CRNA and Anesthesiologist  Anesthesia Plan Comments: (Pt has uncuffed trach, reports that it is only ever changed by her ENT at [facility], will required cuffed trach to be able to provide PPV for laparoscopic surgery, pt has seen Dr. Tami Ribas in the past, discussed the case with him and he says it is ok to remove her trach tube and place a reinforced ETT for the surgery and then replace the trach tube after surgery, he did not have concerns about difficulty with removal/replacement. )       Anesthesia Quick Evaluation

## 2017-12-10 NOTE — Progress Notes (Signed)
Patient reports pain has improved from 8 out of 10 to a 5 out of 10 and she would like to go home. VSS, patient tolerating fluids and food. Discharged home with family.

## 2017-12-10 NOTE — Anesthesia Postprocedure Evaluation (Signed)
Anesthesia Post Note  Patient: Angela David  Procedure(s) Performed: LAPAROSCOPIC CHOLECYSTECTOMY (N/A )  Patient location during evaluation: PACU Anesthesia Type: General Level of consciousness: awake and alert and oriented Pain management: pain level controlled Vital Signs Assessment: post-procedure vital signs reviewed and stable Respiratory status: spontaneous breathing, nonlabored ventilation and respiratory function stable Cardiovascular status: blood pressure returned to baseline and stable Postop Assessment: no signs of nausea or vomiting Anesthetic complications: no     Last Vitals:  Vitals:   12/10/17 1458 12/10/17 1506  BP: (!) 140/57   Pulse: 87 89  Resp: 16 15  Temp:    SpO2: 99% 99%    Last Pain:  Vitals:   12/10/17 1506  TempSrc:   PainSc: 5                  Wileen Duncanson

## 2017-12-10 NOTE — Anesthesia Post-op Follow-up Note (Signed)
Anesthesia QCDR form completed.        

## 2017-12-10 NOTE — Transfer of Care (Signed)
Immediate Anesthesia Transfer of Care Note  Patient: Angela David  Procedure(s) Performed: LAPAROSCOPIC CHOLECYSTECTOMY (N/A )  Patient Location: PACU  Anesthesia Type:General  Level of Consciousness: awake and alert   Airway & Oxygen Therapy: Patient Spontanous Breathing  Post-op Assessment: Report given to RN and Post -op Vital signs reviewed and stable  Post vital signs: Reviewed and stable  Last Vitals:  Vitals Value Taken Time  BP    Temp    Pulse    Resp    SpO2      Last Pain:  Vitals:   12/10/17 1053  TempSrc: Temporal  PainSc: 8          Complications: No apparent anesthesia complications

## 2017-12-10 NOTE — Interval H&P Note (Signed)
History and Physical Interval Note:  12/10/2017 11:56 AM  Angela David  has presented today for surgery, with the diagnosis of BILLIARY DYSKINESIA  The various methods of treatment have been discussed with the patient and family. After consideration of risks, benefits and other options for treatment, the patient has consented to  Procedure(s): LAPAROSCOPIC CHOLECYSTECTOMY (N/A) as a surgical intervention .  The patient's history has been reviewed, patient examined, no change in status, stable for surgery.  I have reviewed the patient's chart and labs.  Questions were answered to the patient's satisfaction.     [name]

## 2017-12-10 NOTE — Progress Notes (Signed)
Cardiac workup reviewed by Dr Randa Lynn. Pt unable to take any am med since she takes med with applesauce

## 2017-12-10 NOTE — Discharge Instructions (Signed)

## 2017-12-11 ENCOUNTER — Encounter: Payer: Self-pay | Admitting: Surgery

## 2017-12-12 LAB — SURGICAL PATHOLOGY

## 2017-12-18 ENCOUNTER — Other Ambulatory Visit: Payer: Self-pay | Admitting: General Surgery

## 2017-12-18 DIAGNOSIS — Z1231 Encounter for screening mammogram for malignant neoplasm of breast: Secondary | ICD-10-CM

## 2017-12-19 ENCOUNTER — Ambulatory Visit: Payer: Medicare Other

## 2017-12-24 ENCOUNTER — Ambulatory Visit: Payer: Medicare Other | Admitting: General Surgery

## 2017-12-25 ENCOUNTER — Ambulatory Visit: Admission: RE | Admit: 2017-12-25 | Payer: Medicare Other | Source: Ambulatory Visit | Admitting: Internal Medicine

## 2017-12-25 ENCOUNTER — Encounter: Admission: RE | Payer: Self-pay | Source: Ambulatory Visit

## 2017-12-25 SURGERY — ESOPHAGOGASTRODUODENOSCOPY (EGD) WITH PROPOFOL
Anesthesia: General

## 2017-12-30 ENCOUNTER — Other Ambulatory Visit: Payer: Self-pay | Admitting: Surgery

## 2017-12-30 DIAGNOSIS — R1011 Right upper quadrant pain: Secondary | ICD-10-CM

## 2018-01-01 ENCOUNTER — Ambulatory Visit
Admission: RE | Admit: 2018-01-01 | Discharge: 2018-01-01 | Disposition: A | Discharge status: Home / Self Care | Payer: Medicare Other | Source: Ambulatory Visit | Attending: Surgery | Admitting: Surgery

## 2018-01-01 DIAGNOSIS — R1011 Right upper quadrant pain: Secondary | ICD-10-CM | POA: Insufficient documentation

## 2018-01-01 DIAGNOSIS — Z9049 Acquired absence of other specified parts of digestive tract: Secondary | ICD-10-CM | POA: Diagnosis not present

## 2018-01-07 IMAGING — MG 2D DIGITAL SCREENING BILATERAL MAMMOGRAM WITH CAD AND ADJUNCT TO
9 of 12 series · 9 of 28 positions shown · non-contrast
Comparison: Previous exam(s).

CLINICAL DATA: Screening.

EXAM:
2D DIGITAL SCREENING BILATERAL MAMMOGRAM WITH CAD AND ADJUNCT TOMO

[R MLO synth-2D]
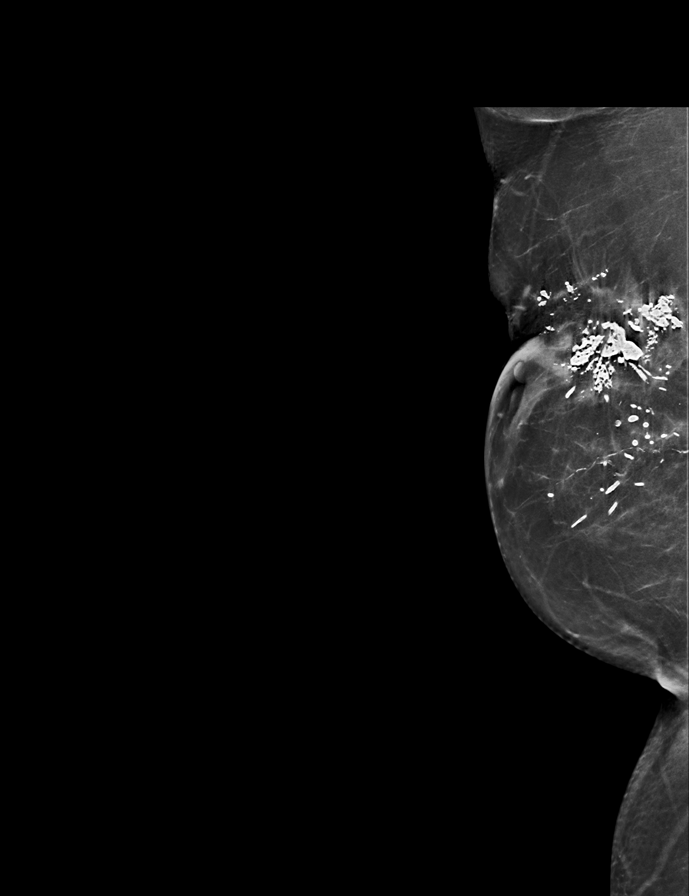

[L CC]
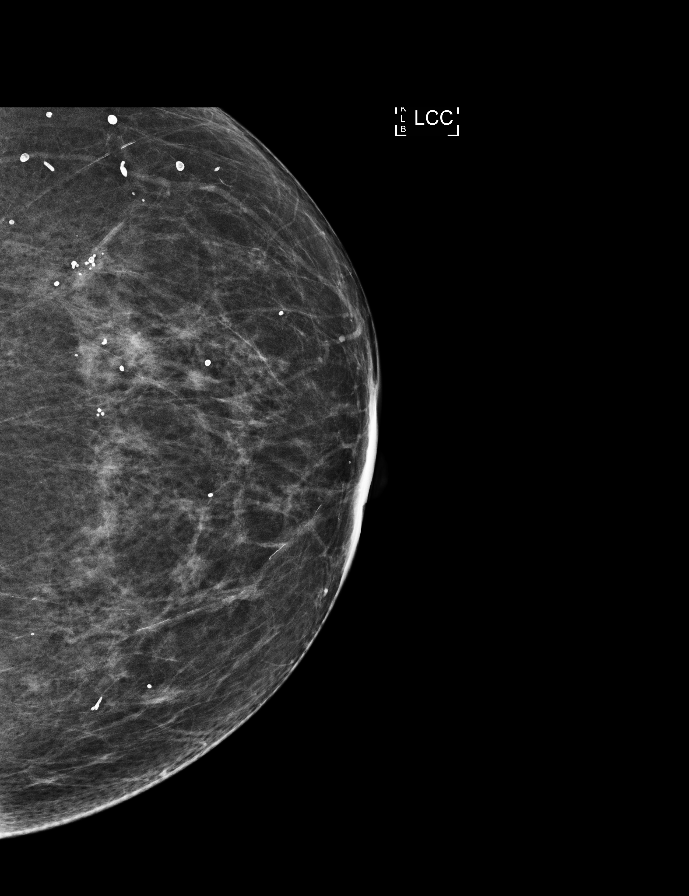

[R CC synth-2D]
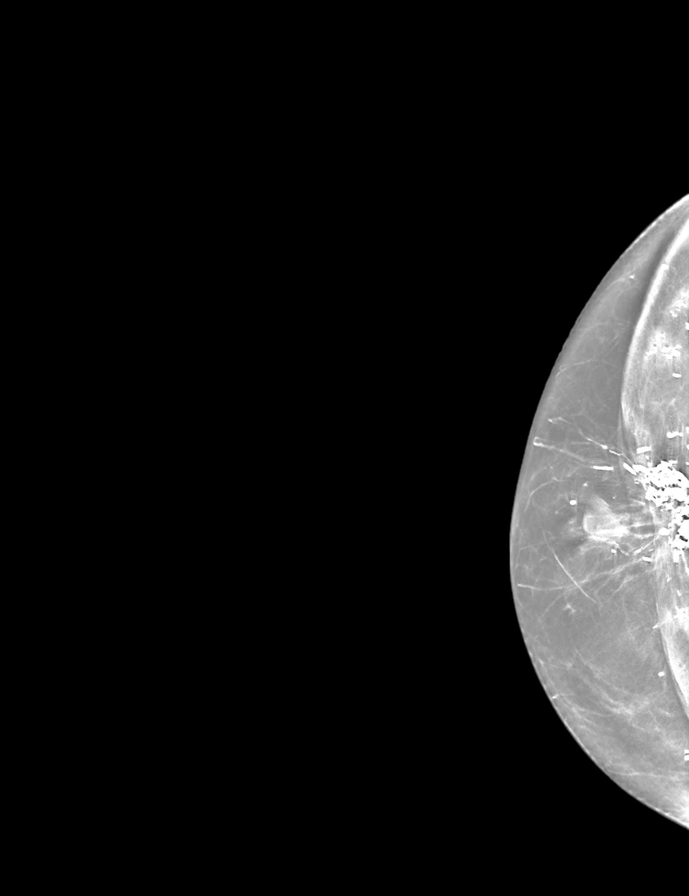

[R CC]
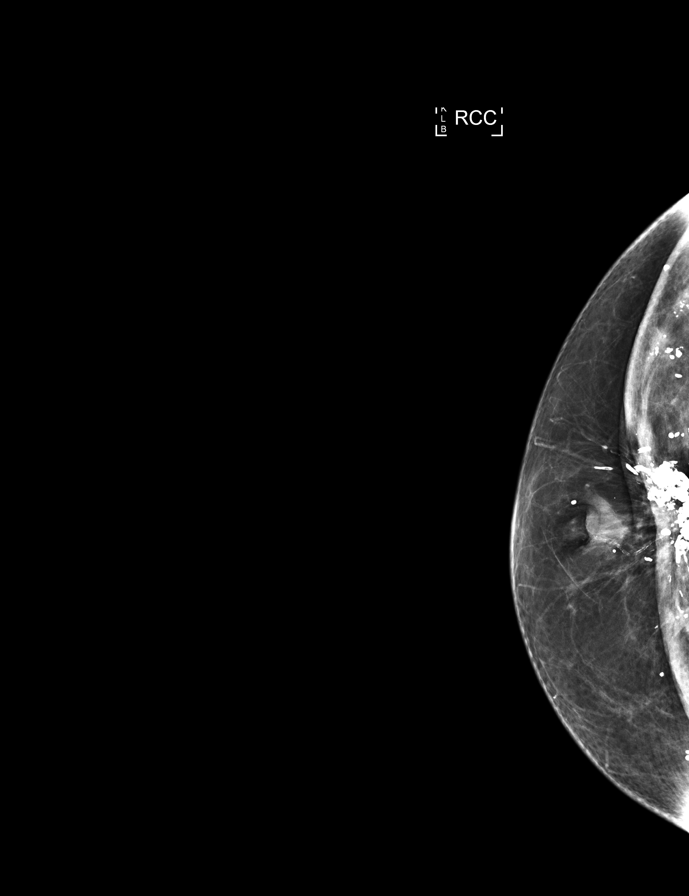

[L CC synth-2D]
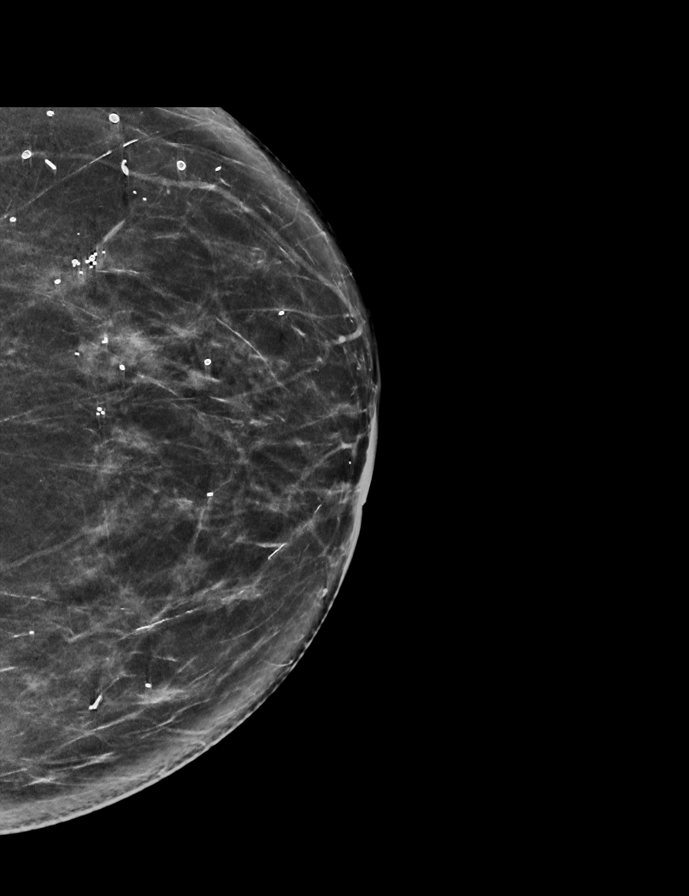

[R MLO]
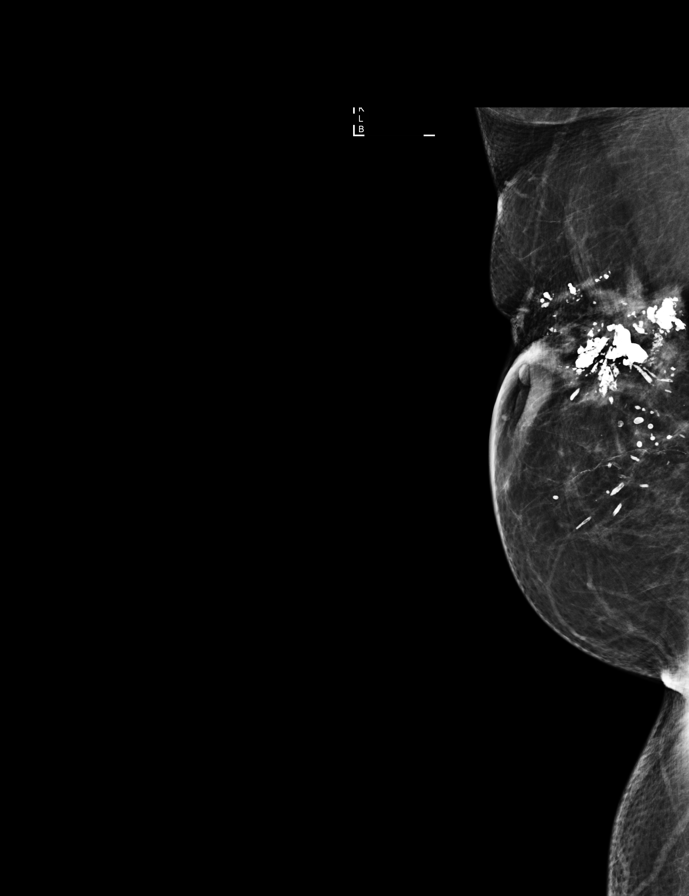

[L MLO synth-2D]
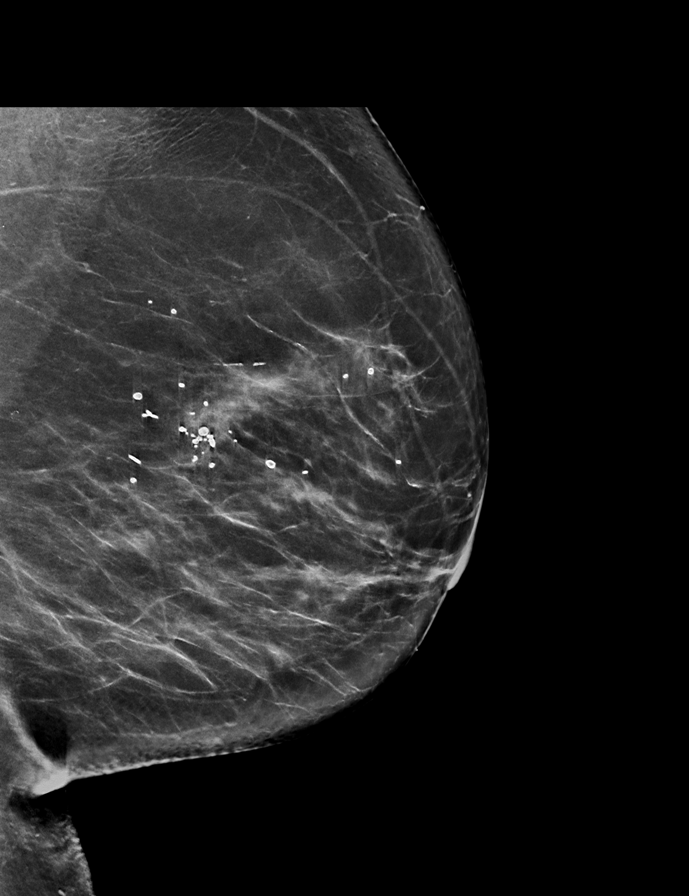

[L MLO]
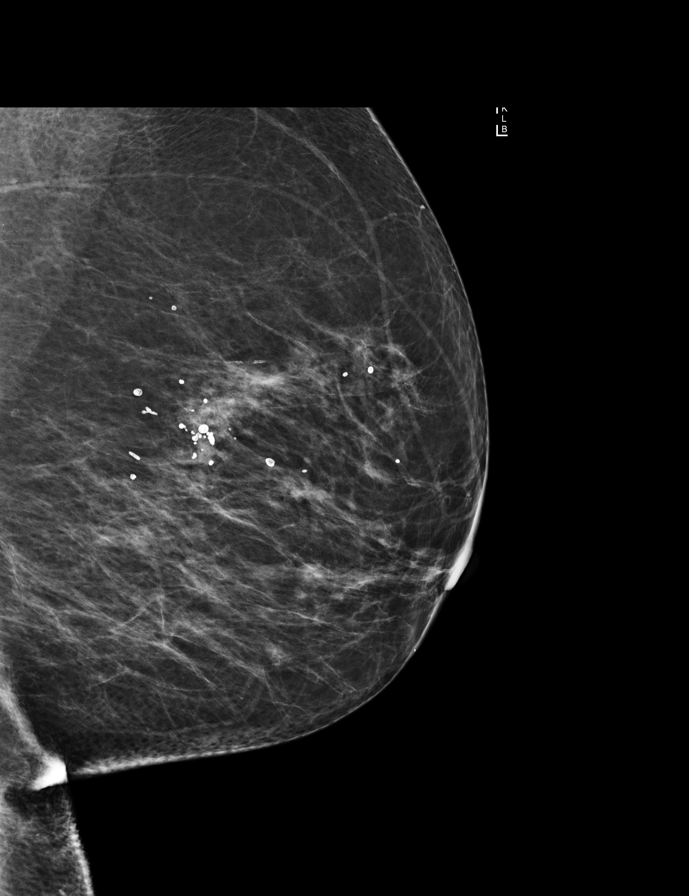

[L CC tomo · tomo slice 35/70.0]
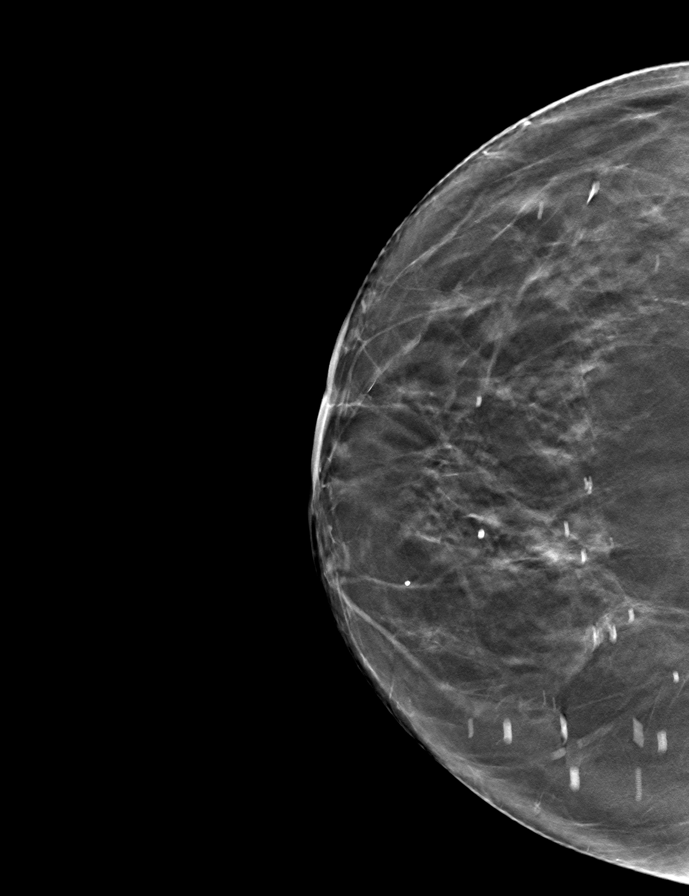

[9 of 28 positions shown; findings below may reference images not displayed]

ACR Breast Density Category b: There are scattered areas of
fibroglandular density.
FINDINGS: There are no findings suspicious for malignancy. Images were
processed with CAD.
IMPRESSION: No mammographic evidence of malignancy. A result letter of this
screening mammogram will be mailed directly to the patient.

RECOMMENDATION:
Screening mammogram in one year. (Code:97-6-RS4)

BI-RADS CATEGORY  1: Negative.

## 2018-01-29 ENCOUNTER — Ambulatory Visit
Admission: RE | Admit: 2018-01-29 | Discharge: 2018-01-29 | Disposition: A | Discharge status: Home / Self Care | Payer: Medicare Other | Source: Ambulatory Visit | Attending: General Surgery | Admitting: General Surgery

## 2018-01-29 DIAGNOSIS — Z1231 Encounter for screening mammogram for malignant neoplasm of breast: Secondary | ICD-10-CM | POA: Diagnosis not present

## 2018-02-04 ENCOUNTER — Ambulatory Visit (INDEPENDENT_AMBULATORY_CARE_PROVIDER_SITE_OTHER): Payer: Medicare Other | Admitting: General Surgery

## 2018-02-04 ENCOUNTER — Encounter: Payer: Self-pay | Admitting: General Surgery

## 2018-02-04 ENCOUNTER — Other Ambulatory Visit: Payer: Self-pay

## 2018-02-04 VITALS — BP 90/60 | HR 80 | Temp 97.3°F | Resp 16 | Ht 67.0 in | Wt 161.0 lb

## 2018-02-04 DIAGNOSIS — Z86010 Personal history of colonic polyps: Secondary | ICD-10-CM

## 2018-02-04 DIAGNOSIS — Z853 Personal history of malignant neoplasm of breast: Secondary | ICD-10-CM

## 2018-02-04 MED ORDER — POLYETHYLENE GLYCOL 3350 17 GM/SCOOP PO POWD: ORAL | 0 refills | Status: DC

## 2018-02-04 NOTE — Patient Instructions (Addendum)
Patient will be asked to return to the office in one year with a bilateral screening mammogram. The patient is aware to call back for any questions or concerns.   The patient is scheduled for a Colonoscopy at [facility] on 03/19/18. They are aware to call the day before to get their arrival time. She will stop her fish oil one week prior. She will hold her Metformin the day of prep and procedure. She will only take her Metoprolol the morning of with water. Miralax prescription has been sent into the patient's pharmacy. The patient is aware of date and instructions.

## 2018-02-04 NOTE — Progress Notes (Signed)
Patient ID: Angela David, female   DOB: [birth year], 77 y.o.   MRN: [id number]  Chief Complaint  Patient presents with  . Follow-up    HPI Angela David is a 77 y.o. female who presents for a breast evaluation. The most recent mammogram was done on 01/29/2018.  Patient does perform regular self breast checks and gets regular mammograms done.    HPI  Past Medical History:  Diagnosis Date  . Breast cancer ([place]) 2002   right breast chemo and radiation  . Cancer [facility]) 2002   right breast  . 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 GERD (gastroesophageal reflux disease)   . Glaucoma   . High cholesterol   . History of colon polyps   . Peripheral neuropathy 08/28/2016  . Personal history of chemotherapy 2002   BREAST CA  . Personal history of malignant neoplasm of breast   . Personal history of radiation therapy 2002   BREAST CA  . Sleep apnea 2011  . Vocal cord paralysis 2012    Past Surgical History:  Procedure Laterality Date  . ABDOMINAL HYSTERECTOMY    . BREAST BIOPSY Right 2008   neg  . BREAST EXCISIONAL BIOPSY Right 2002   positive  . BREAST LUMPECTOMY Right 2002   chemo and radiation  . BREAST SURGERY Right 2002   LUMPECTOMY, radiation, chemotherapy  . CHOLECYSTECTOMY N/A 12/10/2017   Procedure: LAPAROSCOPIC CHOLECYSTECTOMY;  Surgeon: Benjamine Sprague, DO;  Location: [facility] ORS;  Service: General;  Laterality: N/A;  . COLONOSCOPY  2009,12/30/2013  . HYDROCELE EXCISION / REPAIR    . TRACHEAL SURGERY  2013  . TRACHEOSTOMY N/A     Family History  Problem Relation Age of Onset  . Cancer Father        bone cancer  . Cancer Sister        lymphoma  . Cancer Brother   . Breast cancer Neg Hx     Social History Social History   Tobacco Use  . Smoking status: Never Smoker  . Smokeless tobacco: Never Used  Substance Use Topics  . Alcohol use: No  . Drug use: No    Allergies  Allergen Reactions  . Codeine Other (See Comments)    Per patient  "it kept her up all night"  . Eggs Or Egg-Derived Products Other (See Comments)    respitatory distress   . Shellfish Allergy Other (See Comments)    respitatory distress    . Sodium Sulfite Other (See Comments)    Unknown    Current Outpatient Medications  Medication Sig Dispense Refill  . Biotin 1000 MCG tablet Take 1,000 mcg by mouth daily.     . busPIRone (BUSPAR) 15 MG tablet Take 30 mg by mouth 2 (two) times daily.     Marland Kitchen escitalopram (LEXAPRO) 20 MG tablet Take 20 mg by mouth daily.     . folic acid (FOLVITE) 1 MG tablet Take 1 mg by mouth daily.    Marland Kitchen gabapentin (NEURONTIN) 800 MG tablet Take 800 mg by mouth 3 (three) times daily.     . hydrochlorothiazide (HYDRODIURIL) 25 MG tablet Take 25 mg by mouth daily.     Marland Kitchen ibuprofen (ADVIL,MOTRIN) 800 MG tablet Take 1 tablet (800 mg total) by mouth every 8 (eight) hours as needed for mild pain or moderate pain. 30 tablet 0  . lovastatin (MEVACOR) 20 MG tablet Take 20 mg by mouth at bedtime.    . metFORMIN (GLUCOPHAGE) 1000 MG tablet Take 1,000  mg by mouth 2 (two) times daily as needed (for BS >200).     . metoprolol tartrate (LOPRESSOR) 25 MG tablet Take 25 mg by mouth 2 (two) times daily.     . Multiple Vitamins-Minerals (MULTIVITAMIN PO) Take 1 tablet by mouth daily.    . Omega-3 Fatty Acids (FISH OIL PO) Take 1 capsule by mouth daily.    . pantoprazole (PROTONIX) 40 MG tablet Take 40 mg by mouth 2 (two) times daily.     . RESTASIS 0.05 % ophthalmic emulsion Place 1 drop into both eyes 2 (two) times daily.     . traMADol (ULTRAM) 50 MG tablet Take 1 tablet (50 mg total) by mouth every 6 (six) hours as needed. 20 tablet 0  . polyethylene glycol powder (GLYCOLAX/MIRALAX) powder Mix whole container with 64 ounces of clear liquids, No Red liquids. 255 g 0   No current facility-administered medications for this visit.     Review of Systems Review of Systems  Constitutional: Negative.   Respiratory: Negative.   Cardiovascular:  Negative.     Blood pressure 90/60, pulse 80, temperature (!) 97.3 F (36.3 C), temperature source Skin, resp. rate 16, height 5\' 7"  (1.702 m), weight 161 lb (73 kg), SpO2 96 %.  Physical Exam Physical Exam  Constitutional: She is oriented to person, place, and time. She appears well-developed and well-nourished.  Eyes: Conjunctivae are normal. No scleral icterus.  Neck: Neck supple.  Cardiovascular: Normal rate, regular rhythm and normal heart sounds.  Pulmonary/Chest: Effort normal and breath sounds normal. Right breast exhibits no inverted nipple, no mass, no nipple discharge, no skin change and no tenderness. Left breast exhibits no inverted nipple, no mass, no nipple discharge, no skin change and no tenderness.  Lymphadenopathy:    She has no cervical adenopathy.    She has no axillary adenopathy.  Neurological: She is alert and oriented to person, place, and time.  Skin: Skin is warm and dry.    Data Reviewed DIAGNOSIS:  A. HEPATIC FLEXURE POLYP; HOT SNARE:  -TUBULOVILLOUS ADENOMA.  -NEGATIVE FOR HIGH-GRADE DYSPLASIA AND MALIGNANCY.   B. ASCENDING COLON POLYP; COLD BIOPSY:  -TUBULOVILLOUS ADENOMA.  -NEGATIVE FOR HIGH-GRADE DYSPLASIA AND MALIGNANCY.   The bilateral screening mammograms dated January 29, 2018 reviewed.  BI-RADS-1  Post cholecystectomy ultrasound of January 01, 2018 reviewed.  Normal common bile duct.  No fluid.  Suggestion of fatty liver infiltration.  Assessment    No evidence of recurrent breast cancer.  Candidate for follow-up colonoscopy based on identification of tubulovillous pathology and a 5 and 10 mm polyp in 2015.  Persistent right upper quadrant pain post cholecystectomy, unclear etiology.    Plan  Patient will be asked to return to the office in one year with a bilateral screening mammogram.The patient is aware to call back for any questions or concerns.  The patient is a candidate for a follow-up colonoscopy based on her pathology from  2015.  Colonoscopy with possible biopsy/polypectomy prn: Information regarding the procedure, including its potential risks and complications (including but not limited to perforation of the bowel, which may require emergency surgery to repair, and bleeding) was verbally given to the patient. Educational information regarding lower intestinal endoscopy was given to the patient. Written instructions for how to complete the bowel prep using Miralax were provided. The importance of drinking ample fluids to avoid dehydration as a result of the prep emphasized.  HPI, Physical Exam, Assessment and Plan have been scribed under the direction and in the  presence of Hervey Ard, MD.  Gaspar Cola, CMA  I have completed the exam and reviewed the above documentation for accuracy and completeness.  I agree with the above.  Haematologist has been used and any errors in dictation or transcription are unintentional.  Hervey Ard, M.D., F.A.C.S.  The patient is scheduled for a Colonoscopy at [facility] on 03/19/18. They are aware to call the day before to get their arrival time. She will stop her fish oil one week prior. She will hold her Metformin the day of prep and procedure. She will only take her Metoprolol the morning of with water. Miralax prescription has been sent into the patient's pharmacy. The patient is aware of date and instructions.   Forest Gleason Byrnett 02/06/2018, 8:08 AM

## 2018-02-25 ENCOUNTER — Other Ambulatory Visit: Payer: Self-pay

## 2018-02-25 DIAGNOSIS — C50011 Malignant neoplasm of nipple and areola, right female breast: Secondary | ICD-10-CM

## 2018-03-04 ENCOUNTER — Inpatient Hospital Stay: Payer: Medicare Other

## 2018-03-04 ENCOUNTER — Inpatient Hospital Stay (HOSPITAL_BASED_OUTPATIENT_CLINIC_OR_DEPARTMENT_OTHER): Payer: Medicare Other | Admitting: Internal Medicine

## 2018-03-04 ENCOUNTER — Inpatient Hospital Stay: Payer: Medicare Other | Attending: Internal Medicine

## 2018-03-04 ENCOUNTER — Other Ambulatory Visit: Payer: Self-pay

## 2018-03-04 VITALS — Temp 96.8°F

## 2018-03-04 VITALS — BP 107/68 | HR 73 | Temp 94.5°F | Resp 18 | Wt 164.6 lb

## 2018-03-04 DIAGNOSIS — R970 Elevated carcinoembryonic antigen [CEA]: Secondary | ICD-10-CM | POA: Diagnosis not present

## 2018-03-04 DIAGNOSIS — C50811 Malignant neoplasm of overlapping sites of right female breast: Secondary | ICD-10-CM

## 2018-03-04 DIAGNOSIS — C50011 Malignant neoplasm of nipple and areola, right female breast: Secondary | ICD-10-CM

## 2018-03-04 DIAGNOSIS — M8580 Other specified disorders of bone density and structure, unspecified site: Secondary | ICD-10-CM | POA: Insufficient documentation

## 2018-03-04 DIAGNOSIS — Z171 Estrogen receptor negative status [ER-]: Secondary | ICD-10-CM

## 2018-03-04 DIAGNOSIS — G620 Drug-induced polyneuropathy: Secondary | ICD-10-CM | POA: Diagnosis not present

## 2018-03-04 DIAGNOSIS — M810 Age-related osteoporosis without current pathological fracture: Secondary | ICD-10-CM

## 2018-03-04 LAB — CBC WITH DIFFERENTIAL/PLATELET
ABS IMMATURE GRANULOCYTES: 0.02 10*3/uL (ref 0.00–0.07)
Basophils Absolute: 0 10*3/uL (ref 0.0–0.1)
Basophils Relative: 1 %
Eosinophils Absolute: 0.1 10*3/uL (ref 0.0–0.5)
Eosinophils Relative: 1 %
HCT: 40 % (ref 36.0–46.0)
HEMOGLOBIN: 13.2 g/dL (ref 12.0–15.0)
IMMATURE GRANULOCYTES: 0 %
LYMPHS PCT: 24 %
Lymphs Abs: 1.9 10*3/uL (ref 0.7–4.0)
MCH: 27.9 pg (ref 26.0–34.0)
MCHC: 33 g/dL (ref 30.0–36.0)
MCV: 84.6 fL (ref 80.0–100.0)
MONO ABS: 0.5 10*3/uL (ref 0.1–1.0)
Monocytes Relative: 6 %
NEUTROS ABS: 5.3 10*3/uL (ref 1.7–7.7)
NEUTROS PCT: 68 %
Platelets: 203 10*3/uL (ref 150–400)
RBC: 4.73 MIL/uL (ref 3.87–5.11)
RDW: 12.6 % (ref 11.5–15.5)
WBC: 7.8 10*3/uL (ref 4.0–10.5)
nRBC: 0 % (ref 0.0–0.2)

## 2018-03-04 LAB — COMPREHENSIVE METABOLIC PANEL
ALBUMIN: 4 g/dL (ref 3.5–5.0)
ALT: 18 U/L (ref 0–44)
AST: 24 U/L (ref 15–41)
Alkaline Phosphatase: 59 U/L (ref 38–126)
Anion gap: 11 (ref 5–15)
BUN: 23 mg/dL (ref 8–23)
CHLORIDE: 105 mmol/L (ref 98–111)
CO2: 24 mmol/L (ref 22–32)
Calcium: 9.9 mg/dL (ref 8.9–10.3)
Creatinine, Ser: 1.01 mg/dL — ABNORMAL HIGH (ref 0.44–1.00)
GFR calc Af Amer: >60 mL/min (ref >60)
GFR calc non Af Amer: 54 mL/min — ABNORMAL LOW (ref >60)
GLUCOSE: 94 mg/dL (ref 70–99)
POTASSIUM: 4.3 mmol/L (ref 3.5–5.1)
Sodium: 140 mmol/L (ref 135–145)
Total Bilirubin: 0.8 mg/dL (ref 0.3–1.2)
Total Protein: 7.8 g/dL (ref 6.5–8.1)

## 2018-03-04 MED ORDER — DENOSUMAB 60 MG/ML ~~LOC~~ SOSY
PREFILLED_SYRINGE | SUBCUTANEOUS | Status: AC
  Filled 2018-03-04: qty 1

## 2018-03-04 MED ORDER — DENOSUMAB 60 MG/ML ~~LOC~~ SOSY
60.0000 mg | PREFILLED_SYRINGE | Freq: Once | SUBCUTANEOUS | Status: AC
  Administered 2018-03-04: 60 mg via SUBCUTANEOUS

## 2018-03-04 NOTE — Addendum Note (Signed)
Addended by: Sandria Bales B on: 03/04/2018 02:27 PM   Modules accepted: Orders

## 2018-03-04 NOTE — Assessment & Plan Note (Signed)
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 stable.  Clinically no evidence of recurrence.  Last mammogram December 2019-within normal limits.  Continue follow-up with Dr. Tollie Pizza.  # Elevated Tumor Marker- UNLIKELY secondary to recurrent malignancy; most recent CEA was 11 approximately 6 months ago.  Labs from today pending..    # Osteopenia- Dexa Scan 2016- T-score -1.4. stable.  Continue Prolia; due today.  No side effects noted.  Order bone density test today.  # Peripheral neuropathy-s stable.  Continue Neurontin.  # DISPOSITION: add CEA today.  BMD will call with results # BMD in 1 week  # follow up in 6 months with Dr.Corcoran;  labs [labs a few days prior CBC, CMET, CEA]; Prolia injection.-dr.B

## 2018-03-04 NOTE — Progress Notes (Signed)
Here for follow up  Overall doing well but stated depressed and frustrated w trach she has had since 2012  Spoke to pt re counseling services- given phone number-pt appreciated.

## 2018-03-04 NOTE — Progress Notes (Signed)
[name] OFFICE PROGRESS NOTE  Patient Care Team: Albina Billet, MD as PCP - General (Unknown Physician Specialty) Christene Lye, MD (General Surgery) Albina Billet, MD (Internal Medicine)  Cancer Staging No matching staging information was found for the patient.   Oncology History   # 2002- Right Breast ? Stage II lumpec [Dr.sankar] & chemo [AC x4; ? Taxol; Dr.Hathorne; [facility]] s/p RT [[facility]]  # Elevated CEA [facility] Nov 2015- N; Dr.Sanker]  # ? 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  female pleasant patient above history of breast cancer and also osteoporosis is here for follow-up.  Patient interim underwent cholecystectomy.  Her abdominal discomfort is improved.  Continues to have chronic tingling and numbness of the lower extremities.  Not any worse.  No falls.  No new lumps or bumps.   Review of Systems  Constitutional: Positive for malaise/fatigue. Negative for chills, diaphoresis, fever and weight loss.  HENT: Negative for nosebleeds and sore throat.   Eyes: Negative for double vision.  Respiratory: Negative for cough, hemoptysis, sputum production, shortness of breath and wheezing.   Cardiovascular: Negative for chest pain, palpitations, orthopnea and leg swelling.  Gastrointestinal: Negative for abdominal pain, blood in stool, constipation, diarrhea, heartburn, melena, nausea and vomiting.  Genitourinary: Negative for dysuria, frequency and urgency.   Musculoskeletal: Positive for back pain and joint pain.  Skin: Negative.  Negative for itching and rash.  Neurological: Positive for tingling. Negative for dizziness, focal weakness, weakness and headaches.  Endo/Heme/Allergies: Does not bruise/bleed easily.  Psychiatric/Behavioral: Negative for depression. The patient is not nervous/anxious and does not have insomnia.       PAST MEDICAL HISTORY :  Past Medical History:  Diagnosis Date  . Breast cancer ([place]) 2002   right breast chemo and radiation  . Cancer [facility]) 2002   right breast  . Diabetic peripheral neuropathy ([place]) 08/28/2016  . GERD (gastroesophageal reflux disease)   . Glaucoma   . High cholesterol   . History of colon polyps   . Peripheral neuropathy 08/28/2016  . Personal history of chemotherapy 2002   BREAST CA  . Personal history of malignant neoplasm of breast   . Personal history of radiation therapy 2002   BREAST CA  . Sleep apnea 2011  . Vocal cord paralysis 2012    PAST SURGICAL HISTORY :   Past Surgical History:  Procedure Laterality Date  . ABDOMINAL HYSTERECTOMY    . BREAST BIOPSY Right 2008   neg  . BREAST EXCISIONAL BIOPSY Right 2002   positive  . BREAST LUMPECTOMY Right 2002   chemo and radiation  . BREAST SURGERY Right 2002   LUMPECTOMY, radiation, chemotherapy  . CHOLECYSTECTOMY N/A 12/10/2017   Procedure: LAPAROSCOPIC CHOLECYSTECTOMY;  Surgeon: Benjamine Sprague, DO;  Location: [facility] ORS;  Service: General;  Laterality: N/A;  . COLONOSCOPY  2009,12/30/2013  . HYDROCELE EXCISION / REPAIR    . TRACHEAL SURGERY  2013  . TRACHEOSTOMY N/A     FAMILY HISTORY :  Family History  Problem Relation Age of Onset  . Cancer Father        bone cancer  . Cancer Sister        lymphoma  . Cancer Brother   . Breast cancer Neg Hx     SOCIAL HISTORY:   Social History   Tobacco Use  . Smoking status: Never Smoker  . Smokeless tobacco: Never Used  Substance Use Topics  . Alcohol use: No  . Drug use: No     ALLERGIES:  is allergic to codeine; eggs or egg-derived products; eggshell membrane (chicken)  [egg shells]; shellfish allergy; and sodium sulfite.  MEDICATIONS:  Current Outpatient Medications  Medication Sig Dispense Refill  . Biotin 1000 MCG tablet Take 1,000 mcg by mouth daily.     . busPIRone (BUSPAR) 15 MG tablet Take 30 mg by mouth 2 (two) times daily.     Marland Kitchen escitalopram (LEXAPRO) 20 MG tablet Take 20 mg by mouth daily.     . folic acid (FOLVITE) 1 MG tablet Take 1 mg by mouth daily.    Marland Kitchen gabapentin (NEURONTIN) 800 MG tablet Take 800 mg by mouth 3 (three) times daily.     . hydrochlorothiazide (HYDRODIURIL) 25 MG tablet Take 25 mg by mouth daily.     Marland Kitchen lovastatin (MEVACOR) 20 MG tablet Take 20 mg by mouth at bedtime.    . metFORMIN (GLUCOPHAGE) 1000 MG tablet Take 1,000 mg by mouth 2 (two) times daily as needed (for BS >200).     . metoprolol tartrate (LOPRESSOR) 25 MG tablet Take 25 mg by mouth 2 (two) times daily.     . Multiple Vitamins-Minerals (MULTIVITAMIN PO) Take 1 tablet by mouth daily.    . Omega-3 Fatty Acids (FISH OIL PO) Take 1 capsule by mouth daily.    . pantoprazole (PROTONIX) 40 MG tablet Take 40 mg by mouth 2 (two) times daily.     . RESTASIS 0.05 % ophthalmic emulsion Place 1 drop into both eyes 2 (two) times daily.     Marland Kitchen ibuprofen (ADVIL,MOTRIN) 800 MG tablet Take 1 tablet (800 mg total) by mouth every 8 (eight) hours as needed for mild pain or moderate pain. (Patient not taking: Reported on 03/04/2018) 30 tablet 0  . polyethylene glycol powder (GLYCOLAX/MIRALAX) powder Mix whole container with 64 ounces of clear liquids, No Red liquids. (Patient not taking: Reported on 03/04/2018) 255 g 0   No current facility-administered medications for this visit.    Facility-Administered Medications Ordered in Other Visits  Medication Dose Route Frequency Provider Last Rate Last Dose  . denosumab (PROLIA) injection 60 mg  60 mg Subcutaneous Once Cammie Sickle, MD         PHYSICAL EXAMINATION: ECOG PERFORMANCE STATUS: 0 - Asymptomatic  BP 107/68 (BP Location: Left Arm, Patient Position: Sitting)   Pulse 73   Temp (!) 94.5 F (34.7 C) (Tympanic)   Resp 18   Wt 164 lb 9.2 oz (74.6 kg)   BMI 25.78 kg/m   Filed Weights   03/04/18 1339  Weight: 164 lb 9.2 oz (74.6 kg)    Physical Exam  Constitutional: She is oriented to person, place, and time and well-developed, well-nourished, and in no distress.  Patient is walking herself.  She is alone.  HENT:  Head: Normocephalic and atraumatic.  Mouth/Throat: Oropharynx is clear and moist. No oropharyngeal exudate.  Tracheostomy in place.  Eyes: Pupils are equal, round, and reactive to light.  Neck: Normal range of motion. Neck  supple.  Cardiovascular: Normal rate and regular rhythm.  Pulmonary/Chest: No respiratory distress. She has no wheezes.  Abdominal: Soft. Bowel sounds are normal. She exhibits no distension and no mass. There is no abdominal tenderness. There is no rebound and no guarding.  Musculoskeletal: Normal range of motion.        General: No tenderness or edema.  Neurological: She is alert and oriented to person, place, and time.  Skin: Skin is warm.  Psychiatric: Affect normal.      LABORATORY DATA:  I have reviewed the data as listed    Component Value Date/Time   NA 140 03/04/2018 1317   NA 143 08/28/2016 1132   NA 139 12/17/2011 0650   K 4.3 03/04/2018 1317   K 4.1 12/17/2011 0650   CL 105 03/04/2018 1317   CL 106 12/17/2011 0650   CO2 24 03/04/2018 1317   CO2 24 12/17/2011 0650   GLUCOSE 94 03/04/2018 1317   GLUCOSE 209 (H) 12/17/2011 0650   BUN 23 03/04/2018 1317   BUN 16 08/28/2016 1132   BUN 15 12/17/2011 0650   CREATININE 1.01 (H) 03/04/2018 1317   CREATININE 0.88 12/17/2011 0650   CALCIUM 9.9 03/04/2018 1317   CALCIUM 8.8 12/17/2011 0650   PROT 7.8 03/04/2018 1317   PROT 7.6 08/28/2016 1132   PROT 8.4 (H) 12/16/2011 1546   ALBUMIN 4.0 03/04/2018 1317    ALBUMIN 4.3 08/28/2016 1132   ALBUMIN 4.1 12/16/2011 1546   AST 24 03/04/2018 1317   AST 29 12/16/2011 1546   ALT 18 03/04/2018 1317   ALT 26 12/16/2011 1546   ALKPHOS 59 03/04/2018 1317   ALKPHOS 92 12/16/2011 1546   BILITOT 0.8 03/04/2018 1317   BILITOT 0.4 08/28/2016 1132   BILITOT 0.5 12/16/2011 1546   GFRNONAA 54 (L) 03/04/2018 1317   GFRNONAA >60 12/17/2011 0650   GFRAA >60 03/04/2018 1317   GFRAA >60 12/17/2011 0650    No results found for: SPEP, UPEP  Lab Results  Component Value Date   WBC 7.8 03/04/2018   NEUTROABS 5.3 03/04/2018   HGB 13.2 03/04/2018   HCT 40.0 03/04/2018   MCV 84.6 03/04/2018   PLT 203 03/04/2018      Chemistry      Component Value Date/Time   NA 140 03/04/2018 1317   NA 143 08/28/2016 1132   NA 139 12/17/2011 0650   K 4.3 03/04/2018 1317   K 4.1 12/17/2011 0650   CL 105 03/04/2018 1317   CL 106 12/17/2011 0650   CO2 24 03/04/2018 1317   CO2 24 12/17/2011 0650   BUN 23 03/04/2018 1317   BUN 16 08/28/2016 1132   BUN 15 12/17/2011 0650   CREATININE 1.01 (H) 03/04/2018 1317   CREATININE 0.88 12/17/2011 0650      Component Value Date/Time   CALCIUM 9.9 03/04/2018 1317   CALCIUM 8.8 12/17/2011 0650   ALKPHOS 59 03/04/2018 1317   ALKPHOS 92 12/16/2011 1546   AST 24 03/04/2018 1317   AST 29 12/16/2011 1546   ALT 18 03/04/2018 1317   ALT 26 12/16/2011 1546   BILITOT 0.8 03/04/2018 1317   BILITOT 0.4 08/28/2016 1132   BILITOT 0.5 12/16/2011 1546       RADIOGRAPHIC STUDIES: I have personally reviewed the radiological images as listed and agreed with the findings in the report. No results found.   ASSESSMENT & PLAN:  Carcinoma of overlapping sites of right breast in female, estrogen receptor negative ([place]) # Right breast cancer  stage II; ER/ PR her 2 neu- NEG. stable.  Clinically no evidence of recurrence.  Last mammogram December 2019-within normal limits.  Continue follow-up with Dr. Tollie Pizza.  # Elevated Tumor Marker- UNLIKELY  secondary to recurrent malignancy; most recent CEA was 11 approximately 6 months ago.  Labs from today pending..    # Osteopenia- Dexa Scan 2016- T-score -1.4. stable.  Continue Prolia; due today.  No side effects noted.  Order bone density test today.  # Peripheral neuropathy-s stable.  Continue Neurontin.  # DISPOSITION: add CEA today.  BMD will call with results # BMD in 1 week  # follow up in 6 months with Dr.Corcoran;  labs [labs a few days prior CBC, CMET, CEA]; Prolia injection.-dr.B    Orders Placed This Encounter  Procedures  . CEA    Standing Status:   Future    Standing Expiration Date:   03/05/2019   All questions were answered. The patient knows to call the clinic with any problems, questions or concerns.      Cammie Sickle, MD 03/04/2018 2:17 PM

## 2018-03-05 LAB — CANCER ANTIGEN 27.29: CA 27.29: 27.8 U/mL (ref 0.0–38.6)

## 2018-03-05 LAB — CEA: CEA1: 9.4 ng/mL — AB (ref 0.0–4.7)

## 2018-03-10 ENCOUNTER — Telehealth: Payer: Self-pay | Admitting: *Deleted

## 2018-03-10 NOTE — Telephone Encounter (Signed)
Patient want to wait for her colonoscopy until Dr. Bary Castilla returns. Per patient

## 2018-03-11 ENCOUNTER — Telehealth: Payer: Self-pay | Admitting: Internal Medicine

## 2018-03-11 NOTE — Telephone Encounter (Signed)
Heather/[name] please inform patient that patient's CEA/tumor marker is stable at 9.4.  No new recommendations. follow-up as planned.

## 2018-03-18 ENCOUNTER — Other Ambulatory Visit: Payer: Medicare Other

## 2018-03-19 ENCOUNTER — Ambulatory Visit: Admit: 2018-03-19 | Payer: Medicare Other | Admitting: General Surgery

## 2018-03-19 SURGERY — COLONOSCOPY WITH PROPOFOL
Anesthesia: General

## 2018-03-24 ENCOUNTER — Other Ambulatory Visit: Payer: Self-pay | Admitting: General Surgery

## 2018-03-24 ENCOUNTER — Telehealth: Payer: Self-pay | Admitting: *Deleted

## 2018-03-24 DIAGNOSIS — Z86010 Personal history of colonic polyps: Secondary | ICD-10-CM

## 2018-03-24 NOTE — Telephone Encounter (Signed)
Patient contacted today and notified that Dr. Bary Castilla is back and we can get colonoscopy rescheduled.   Patient has been rescheduled for a colonoscopy on 04-16-18 at [facility]. The patient was reminded to stop fish oil one week prior to procedure. She will hold metformin day of prep and procedure. Also, aware to take metoprolol at 6 am the morning of procedure with a sip of water.   The patient states she has had no medication changes or health changes since her last office visit.    This patient is aware to call the office if they have further questions.

## 2018-03-25 ENCOUNTER — Inpatient Hospital Stay: Admission: RE | Admit: 2018-03-25 | Payer: Medicare Other | Source: Ambulatory Visit

## 2018-04-03 DIAGNOSIS — R2689 Other abnormalities of gait and mobility: Secondary | ICD-10-CM | POA: Insufficient documentation

## 2018-04-03 DIAGNOSIS — F331 Major depressive disorder, recurrent, moderate: Secondary | ICD-10-CM | POA: Insufficient documentation

## 2018-04-09 ENCOUNTER — Ambulatory Visit
Admission: RE | Admit: 2018-04-09 | Discharge: 2018-04-09 | Disposition: A | Discharge status: Home / Self Care | Payer: Medicare Other | Source: Ambulatory Visit | Attending: Internal Medicine | Admitting: Internal Medicine

## 2018-04-09 ENCOUNTER — Telehealth: Payer: Self-pay | Admitting: *Deleted

## 2018-04-09 DIAGNOSIS — C50811 Malignant neoplasm of overlapping sites of right female breast: Secondary | ICD-10-CM | POA: Diagnosis present

## 2018-04-09 DIAGNOSIS — Z171 Estrogen receptor negative status [ER-]: Secondary | ICD-10-CM | POA: Diagnosis present

## 2018-04-09 DIAGNOSIS — M85851 Other specified disorders of bone density and structure, right thigh: Secondary | ICD-10-CM | POA: Diagnosis not present

## 2018-04-09 NOTE — Telephone Encounter (Signed)
Contacted patient per her request. Patient came to the [facility] clinic today to request to speak to Dr. Aletha Halim nurse regarding the dx that was listed on her AVS. 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 She is upset and wants to know why her paper says that because she hasn't seen malignant anything since 2002."  Contacted patient and explained that her diaganosis is on the AVS because she is currently being followed for her breast cancer. She stated that she has "never seen her diagnosis listed on the AVS before now and didn't understand." She thanked me for returning her call

## 2018-04-15 ENCOUNTER — Telehealth: Payer: Self-pay | Admitting: Internal Medicine

## 2018-04-15 NOTE — Telephone Encounter (Signed)
Angela David/[name] please inform patient that her bone density-shows osteopenia [unchanged from a previous bone density in 2016]; continue follow-up as planned/ no new recommendations. Thanks.  GB

## 2018-04-16 ENCOUNTER — Ambulatory Visit: Payer: Medicare Other | Admitting: Certified Registered Nurse Anesthetist

## 2018-04-16 ENCOUNTER — Ambulatory Visit
Admission: RE | Admit: 2018-04-16 | Discharge: 2018-04-16 | Disposition: A | Discharge status: Home / Self Care | Payer: Medicare Other | Attending: General Surgery | Admitting: General Surgery

## 2018-04-16 ENCOUNTER — Encounter
Admission: RE | Disposition: A | Discharge status: Home / Self Care | Payer: Self-pay | Source: Home / Self Care | Attending: General Surgery

## 2018-04-16 DIAGNOSIS — Z1211 Encounter for screening for malignant neoplasm of colon: Secondary | ICD-10-CM | POA: Insufficient documentation

## 2018-04-16 DIAGNOSIS — Z86010 Personal history of colonic polyps: Secondary | ICD-10-CM | POA: Insufficient documentation

## 2018-04-16 DIAGNOSIS — G4730 Sleep apnea, unspecified: Secondary | ICD-10-CM | POA: Insufficient documentation

## 2018-04-16 DIAGNOSIS — D123 Benign neoplasm of transverse colon: Secondary | ICD-10-CM

## 2018-04-16 DIAGNOSIS — E7800 Pure hypercholesterolemia, unspecified: Secondary | ICD-10-CM | POA: Insufficient documentation

## 2018-04-16 DIAGNOSIS — E1142 Type 2 diabetes mellitus with diabetic polyneuropathy: Secondary | ICD-10-CM | POA: Insufficient documentation

## 2018-04-16 DIAGNOSIS — Z885 Allergy status to narcotic agent status: Secondary | ICD-10-CM | POA: Insufficient documentation

## 2018-04-16 DIAGNOSIS — D127 Benign neoplasm of rectosigmoid junction: Secondary | ICD-10-CM

## 2018-04-16 DIAGNOSIS — Z79899 Other long term (current) drug therapy: Secondary | ICD-10-CM | POA: Diagnosis not present

## 2018-04-16 DIAGNOSIS — Z7984 Long term (current) use of oral hypoglycemic drugs: Secondary | ICD-10-CM | POA: Diagnosis not present

## 2018-04-16 DIAGNOSIS — Z853 Personal history of malignant neoplasm of breast: Secondary | ICD-10-CM | POA: Insufficient documentation

## 2018-04-16 DIAGNOSIS — K219 Gastro-esophageal reflux disease without esophagitis: Secondary | ICD-10-CM | POA: Diagnosis not present

## 2018-04-16 HISTORY — PX: COLONOSCOPY WITH PROPOFOL: SHX5780

## 2018-04-16 LAB — GLUCOSE, CAPILLARY: Glucose-Capillary: 140 mg/dL — ABNORMAL HIGH (ref 70–99)

## 2018-04-16 SURGERY — COLONOSCOPY WITH PROPOFOL
Anesthesia: General

## 2018-04-16 MED ORDER — LIDOCAINE HCL (PF) 2 % IJ SOLN
INTRAMUSCULAR | Status: AC
  Filled 2018-04-16: qty 10

## 2018-04-16 MED ORDER — PROPOFOL 10 MG/ML IV BOLUS
INTRAVENOUS | Status: DC | PRN
  Administered 2018-04-16 (×3): 20 mg via INTRAVENOUS

## 2018-04-16 MED ORDER — SODIUM CHLORIDE 0.9 % IV SOLN
INTRAVENOUS | Status: DC
  Administered 2018-04-16: 1000 mL via INTRAVENOUS
  Administered 2018-04-16: 08:00:00 via INTRAVENOUS

## 2018-04-16 MED ORDER — SPOT INK MARKER SYRINGE KIT
PACK | SUBMUCOSAL | Status: DC | PRN
  Administered 2018-04-16: 3 mL via SUBMUCOSAL

## 2018-04-16 MED ORDER — PROPOFOL 500 MG/50ML IV EMUL
INTRAVENOUS | Status: DC | PRN
  Administered 2018-04-16: 100 ug/kg/min via INTRAVENOUS

## 2018-04-16 MED ORDER — PROPOFOL 500 MG/50ML IV EMUL
INTRAVENOUS | Status: AC
  Filled 2018-04-16: qty 50

## 2018-04-16 MED ORDER — LIDOCAINE HCL (CARDIAC) PF 100 MG/5ML IV SOSY
PREFILLED_SYRINGE | INTRAVENOUS | Status: DC | PRN
  Administered 2018-04-16: 50 mg via INTRAVENOUS

## 2018-04-16 MED ORDER — SODIUM CHLORIDE (PF) 0.9 % IJ SOLN
INTRAMUSCULAR | Status: DC | PRN
  Administered 2018-04-16: 8 mL via INTRAVENOUS

## 2018-04-16 MED ORDER — PHENYLEPHRINE HCL 10 MG/ML IJ SOLN
INTRAMUSCULAR | Status: DC | PRN
  Administered 2018-04-16 (×5): 100 ug via INTRAVENOUS

## 2018-04-16 MED ORDER — PROPOFOL 10 MG/ML IV BOLUS
INTRAVENOUS | Status: AC
  Filled 2018-04-16: qty 20

## 2018-04-16 NOTE — Anesthesia Post-op Follow-up Note (Signed)
Anesthesia QCDR form completed.        

## 2018-04-16 NOTE — Anesthesia Postprocedure Evaluation (Signed)
Anesthesia Post Note  Patient: Angela David  Procedure(s) Performed: COLONOSCOPY WITH PROPOFOL (N/A )  Patient location during evaluation: Endoscopy Anesthesia Type: General Level of consciousness: awake and alert Pain management: pain level controlled Vital Signs Assessment: post-procedure vital signs reviewed and stable Respiratory status: spontaneous breathing and respiratory function stable Cardiovascular status: stable Anesthetic complications: no     Last Vitals:  Vitals:   04/16/18 0915 04/16/18 0925  BP: (!) 144/103 (!) 152/63  Pulse: 88   Resp: 16   Temp:    SpO2: 100%     Last Pain:  Vitals:   04/16/18 0925  TempSrc:   PainSc: 0-No pain                 Broden Holt K

## 2018-04-16 NOTE — H&P (Signed)
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  For follow-up colonoscopy.  She reports tolerating the prep well.  No nausea or vomiting.  Medications Prior to Admission  Medication Sig Dispense Refill Last Dose  . Biotin 1000 MCG tablet Take 1,000 mcg by mouth daily.    Taking  . busPIRone (BUSPAR) 15 MG tablet Take 30 mg by mouth 2 (two) times daily.    Taking  . escitalopram (LEXAPRO) 20 MG tablet Take 20 mg by mouth daily.    Taking  . folic acid (FOLVITE) 1 MG tablet Take 1 mg by mouth daily.   Taking  . gabapentin (NEURONTIN) 800 MG tablet Take 800 mg by mouth 3 (three) times daily.    Taking  . hydrochlorothiazide (HYDRODIURIL) 25 MG tablet Take 25 mg by mouth daily.    Taking  . ibuprofen (ADVIL,MOTRIN) 800 MG tablet Take 1 tablet (800 mg total) by mouth every 8 (eight) hours as needed for mild pain or moderate pain. (Patient not taking: Reported on 03/04/2018) 30 tablet 0 Not Taking  . lovastatin (MEVACOR) 20 MG tablet Take 20 mg by mouth at bedtime.   Taking  . metFORMIN (GLUCOPHAGE) 1000 MG tablet Take 1,000 mg by mouth 2 (two) times daily as needed (for BS >200).    Taking  . metoprolol tartrate (LOPRESSOR) 25 MG tablet Take 25 mg by mouth 2 (two) times daily.    04/14/2018  . Multiple Vitamins-Minerals (MULTIVITAMIN PO) Take 1 tablet by mouth daily.   Taking  . Omega-3 Fatty Acids (FISH OIL PO) Take 1 capsule by mouth daily.   Taking  . pantoprazole (PROTONIX) 40 MG tablet Take 40 mg by mouth 2 (two) times daily.    Taking  . polyethylene glycol powder (GLYCOLAX/MIRALAX) powder Mix whole container with 64 ounces of clear liquids, No Red liquids. (Patient not taking: Reported on 03/04/2018) 255 g 0 Not Taking  . RESTASIS 0.05 % ophthalmic emulsion Place 1 drop into both eyes 2 (two) times daily.    Taking   Allergies  Allergen Reactions  . Codeine  Other (See Comments)    Per patient "it kept her up all night"  . Eggs Or Egg-Derived Products Other (See Comments)    respitatory distress   . Eggshell Membrane (Chicken)  [Egg Shells]     Other reaction(s): Other (See Comments) respitatory distress  . Shellfish Allergy Other (See Comments)    respitatory distress   Other reaction(s): OTHER Other reaction(s): Other (See Comments) respitatory distress   . Sodium Sulfite Other (See Comments)    Unknown   Past Medical History:  Diagnosis Date  . Breast cancer ([place]) 2002   right breast chemo and radiation  . Cancer [facility]) 2002   right breast  . Diabetic peripheral neuropathy ([place]) 08/28/2016  . GERD (gastroesophageal reflux disease)   . Glaucoma   . High cholesterol   . History of colon polyps   . Peripheral neuropathy 08/28/2016  . Personal history of chemotherapy 2002   BREAST CA  . Personal history of malignant neoplasm of breast   . Personal history of radiation therapy 2002   BREAST CA  . Sleep apnea 2011  . Vocal cord paralysis 2012   Past Surgical History:  Procedure Laterality Date  . ABDOMINAL HYSTERECTOMY    . BREAST BIOPSY Right 2008   neg  . BREAST  EXCISIONAL BIOPSY Right 2002   positive  . BREAST LUMPECTOMY Right 2002   chemo and radiation  . BREAST SURGERY Right 2002   LUMPECTOMY, radiation, chemotherapy  . CHOLECYSTECTOMY N/A 12/10/2017   Procedure: LAPAROSCOPIC CHOLECYSTECTOMY;  Surgeon: Benjamine Sprague, DO;  Location: [facility] ORS;  Service: General;  Laterality: N/A;  . COLONOSCOPY  2009,12/30/2013  . HYDROCELE EXCISION / REPAIR    . TRACHEAL SURGERY  2013  . TRACHEOSTOMY N/A    Social History   Socioeconomic History  . Marital status: Married    Spouse name: [name]  . Number of children: Not on file  . Years of education: Not on file  . Highest education level: Not on file  Occupational History  . Not on file  Social Needs  . Financial resource strain: Not on file  . Food insecurity:    Worry:  Not on file    Inability: Not on file  . Transportation needs:    Medical: Not on file    Non-medical: Not on file  Tobacco Use  . Smoking status: Never Smoker  . Smokeless tobacco: Never Used  Substance and Sexual Activity  . Alcohol use: No  . Drug use: No  . Sexual activity: Not on file  Lifestyle  . Physical activity:    Days per week: Not on file    Minutes per session: Not on file  . Stress: Not on file  Relationships  . Social connections:    Talks on phone: Not on file    Gets together: Not on file    Attends religious service: Not on file    Active member of club or organization: Not on file    Attends meetings of clubs or organizations: Not on file    Relationship status: Not on file  . Intimate partner violence:    Fear of current or ex partner: Not on file    Emotionally abused: Not on file    Physically abused: Not on file    Forced sexual activity: Not on file  Other Topics Concern  . Not on file  Social History Narrative   Lives with husband   Social History   Social History Narrative   Lives with husband     ROS: Negative.     PE: HEENT: Negative. Lungs: Clear. Cardio: RR.  Assessment/Plan:  Proceed with planned endoscopy.  Forest Gleason [facility] 04/16/2018

## 2018-04-16 NOTE — Anesthesia Preprocedure Evaluation (Signed)
Anesthesia Evaluation  Patient identified by MRN, date of birth, ID band Patient awake    Reviewed: Allergy & Precautions, NPO status , Patient's Chart, lab work & pertinent test results, reviewed documented beta blocker date and time   History of Anesthesia Complications Negative for: history of anesthetic complications  Airway Mallampati: Trach       Dental   Pulmonary neg sleep apnea, neg COPD,           Cardiovascular (-) hypertension(-) Past MI and (-) CHF + dysrhythmias (tachycardia) (-) Valvular Problems/Murmurs     Neuro/Psych neg Seizures Anxiety Depression    GI/Hepatic Neg liver ROS, GERD  Medicated and Controlled,  Endo/Other  diabetes, Type 2, Oral Hypoglycemic Agents  Renal/GU negative Renal ROS     Musculoskeletal   Abdominal   Peds  Hematology   Anesthesia Other Findings   Reproductive/Obstetrics                             Anesthesia Physical Anesthesia Plan  ASA: III  Anesthesia Plan: General   Post-op Pain Management:    Induction: Intravenous  PONV Risk Score and Plan: 3 and Propofol infusion, TIVA and Midazolam  Airway Management Planned: Tracheostomy  Additional Equipment:   Intra-op Plan:   Post-operative Plan:   Informed Consent: I have reviewed the patients History and Physical, chart, labs and discussed the procedure including the risks, benefits and alternatives for the proposed anesthesia with the patient or authorized representative who has indicated his/her understanding and acceptance.       Plan Discussed with:   Anesthesia Plan Comments:         Anesthesia Quick Evaluation

## 2018-04-16 NOTE — Telephone Encounter (Signed)
Patient notified of these lab results and verbalized understanding.   

## 2018-04-16 NOTE — Transfer of Care (Signed)
Immediate Anesthesia Transfer of Care Note  Patient: Angela David  Procedure(s) Performed: COLONOSCOPY WITH PROPOFOL (N/A )  Patient Location: PACU  Anesthesia Type:General  Level of Consciousness: awake, alert  and oriented  Airway & Oxygen Therapy: Patient Spontanous Breathing and Patient connected to tracheostomy mask oxygen  Post-op Assessment: Report given to RN and Post -op Vital signs reviewed and stable  Post vital signs: Reviewed and stable  Last Vitals:  Vitals Value Taken Time  BP 118/56 04/16/2018  9:05 AM  Temp 36.3 C 04/16/2018  9:05 AM  Pulse 103 04/16/2018  9:05 AM  Resp 15 04/16/2018  9:05 AM  SpO2 100 % 04/16/2018  9:05 AM    Last Pain:  Vitals:   04/16/18 0905  TempSrc: Tympanic  PainSc: 0-No pain         Complications: No apparent anesthesia complications

## 2018-04-16 NOTE — Op Note (Signed)
[facility] Gastroenterology Patient Name: Angela David Procedure Date: 04/16/2018 7:47 AM MRN: [id number] Account #: [id number] Date of Birth: Jan 25, 1941 Admit Type: Outpatient Age: 78 Room: [facility] ENDO ROOM 1 Gender: Female Note Status: Finalized Procedure:            Colonoscopy Indications:          High risk colon cancer surveillance: Personal history                        of colonic polyps Providers:            Robert Bellow, MD Referring MD:         Tracie Harrier, MD (Referring MD) Medicines:            Monitored Anesthesia Care Complications:        No immediate complications. Procedure:            Pre-Anesthesia Assessment:                       - Prior to the procedure, a History and Physical was                        performed, and patient medications, allergies and                        sensitivities were reviewed. The patient's tolerance of                        previous anesthesia was reviewed.                       - The risks and benefits of the procedure and the                        sedation options and risks were discussed with the                        patient. All questions were answered and informed                        consent was obtained.                       After obtaining informed consent, the colonoscope was                        passed under direct vision. Throughout the procedure,                        the patient's blood pressure, pulse, and oxygen                        saturations were monitored continuously. The                        Colonoscope was introduced through the anus and                        advanced to the the cecum, identified by appendiceal  orifice and ileocecal valve. The colonoscopy was                        somewhat difficult due to significant looping.                        Successful completion of the procedure was aided by                        using manual  pressure. The patient tolerated the                        procedure well. The quality of the bowel preparation                        was excellent. Findings:      Two pedunculated polyps were found in the transverse colon. The polyps       were 6 to 12 mm in size. These polyps were removed with a hot snare.       Resection was complete, but the polyp tissue was only partially       retrieved.      A 45 mm polyp was found in the proximal transverse colon. The polyp was       carpet-like. Area was unsuccessfully injected with 8 mL saline for a       lift polypectomy. Area was tattooed with an injection of 3 mL of [place]       ink.      A 7 mm polyp was found in the recto-sigmoid colon. The polyp was       sessile. The polyp was removed with a hot snare. Resection and retrieval       were complete.      The retroflexed view of the distal rectum and anal verge was normal and       showed no anal or rectal abnormalities. Impression:           - Two 6 to 12 mm polyps in the transverse colon,                        removed with a hot snare. Complete resection. Partial                        retrieval.                       - One 45 mm polyp in the proximal transverse colon.                        Treatment not successful. Tattooed.                       - One 7 mm polyp at the recto-sigmoid colon, removed                        with a hot snare. Resected and retrieved.                       - The distal rectum and anal verge are normal on  retroflexion view. Recommendation:       - Return to endoscopist in 6 days. Procedure Code(s):    --- Professional ---                       [phone], Colonoscopy, flexible; with removal of tumor(s),                        polyp(s), or other lesion(s) by snare technique                       [id number], Colonoscopy, flexible; with directed submucosal                        injection(s), any substance Diagnosis Code(s):    --- Professional  ---                       Z86.010, Personal history of colonic polyps                       D12.7, Benign neoplasm of rectosigmoid junction                       D12.3, Benign neoplasm of transverse colon (hepatic                        flexure or splenic flexure) CPT copyright 2018 American Medical Association. All rights reserved. The codes documented in this report are preliminary and upon coder review may  be revised to meet current compliance requirements. Robert Bellow, MD 04/16/2018 9:09:34 AM This report has been signed electronically. Number of Addenda: 0 Note Initiated On: 04/16/2018 7:47 AM Scope Withdrawal Time: 0 hours 55 minutes 57 seconds  Total Procedure Duration: 1 hour 8 minutes 58 seconds       [facility]

## 2018-04-17 ENCOUNTER — Telehealth: Payer: Self-pay | Admitting: General Surgery

## 2018-04-17 ENCOUNTER — Encounter: Payer: Self-pay | Admitting: General Surgery

## 2018-04-17 LAB — SURGICAL PATHOLOGY

## 2018-04-17 NOTE — Telephone Encounter (Signed)
Notified the patient that the pathology did not show any malignancy. Reports feeling well. Will arrange to get together next week to discuss options for management of the large polyp.

## 2018-04-24 ENCOUNTER — Other Ambulatory Visit: Payer: Self-pay

## 2018-04-24 ENCOUNTER — Ambulatory Visit (INDEPENDENT_AMBULATORY_CARE_PROVIDER_SITE_OTHER): Payer: Medicare Other | Admitting: General Surgery

## 2018-04-24 ENCOUNTER — Encounter: Payer: Self-pay | Admitting: General Surgery

## 2018-04-24 VITALS — BP 118/62 | HR 74 | Temp 97.3°F | Resp 16 | Ht 64.0 in | Wt 165.0 lb

## 2018-04-24 DIAGNOSIS — Z86010 Personal history of colonic polyps: Secondary | ICD-10-CM | POA: Diagnosis not present

## 2018-04-24 NOTE — Progress Notes (Signed)
Patient ID: Angela David, female   DOB: 16-Jul-1940, 78 y.o.   MRN: [id number]  Chief Complaint  Patient presents with  . Follow-up    Discuss large polyp    HPI Angela David is a 78 y.o. female.  Here today to discuss Colonoscopy results of large polyp. Denies blood in stools. Normal bowel movements. .     Past Medical History:  Diagnosis Date  . Breast cancer ([place]) 2002   right breast chemo and radiation  . Cancer [facility]) 2002   right breast  . Diabetic peripheral neuropathy ([place]) 08/28/2016  . GERD (gastroesophageal reflux disease)   . Glaucoma   . High cholesterol   . History of colon polyps   . Peripheral neuropathy 08/28/2016  . Personal history of chemotherapy 2002   BREAST CA  . Personal history of malignant neoplasm of breast   . Personal history of radiation therapy 2002   BREAST CA  . Sleep apnea 2011  . Vocal cord paralysis 2012    Past Surgical History:  Procedure Laterality Date  . ABDOMINAL HYSTERECTOMY    . BREAST BIOPSY Right 2008   neg  . BREAST EXCISIONAL BIOPSY Right 2002   positive  . BREAST LUMPECTOMY Right 2002   chemo and radiation  . BREAST SURGERY Right 2002   LUMPECTOMY, radiation, chemotherapy  . CHOLECYSTECTOMY N/A 12/10/2017   Procedure: LAPAROSCOPIC CHOLECYSTECTOMY;  Surgeon: Benjamine Sprague, DO;  Location: [facility] ORS;  Service: General;  Laterality: N/A;  . COLONOSCOPY  2009,12/30/2013  . COLONOSCOPY WITH PROPOFOL N/A 04/16/2018   Tubulovillous polyp proximal transverse colon, large sessile polyp, proximal transverse colon;  Surgeon: Robert Bellow, MD;  Location: [facility] ENDOSCOPY;  Service: Endoscopy;  Laterality: N/A;  . HYDROCELE EXCISION / REPAIR    . TRACHEAL SURGERY  2013  . TRACHEOSTOMY N/A     Family History  Problem Relation Age of Onset  . Cancer Father        bone cancer  . Cancer Sister        lymphoma  . Cancer Brother   . Breast cancer Neg Hx     Social History Social History   Tobacco Use  . Smoking  status: Never Smoker  . Smokeless tobacco: Never Used  Substance Use Topics  . Alcohol use: No  . Drug use: No    Allergies  Allergen Reactions  . Codeine Other (See Comments)    Per patient "it kept her up all night"  . Eggs Or Egg-Derived Products Other (See Comments)    respitatory distress   . Eggshell Membrane (Chicken)  [Egg Shells]     Other reaction(s): Other (See Comments) respitatory distress  . Shellfish Allergy Other (See Comments)    respitatory distress   Other reaction(s): OTHER Other reaction(s): Other (See Comments) respitatory distress   . Sodium Sulfite Other (See Comments)    Unknown    Current Outpatient Medications  Medication Sig Dispense Refill  . Biotin 1000 MCG tablet Take 1,000 mcg by mouth daily.     . busPIRone (BUSPAR) 15 MG tablet Take 30 mg by mouth 2 (two) times daily.     Marland Kitchen escitalopram (LEXAPRO) 20 MG tablet Take 20 mg by mouth daily.     . folic acid (FOLVITE) 1 MG tablet Take 1 mg by mouth daily.    Marland Kitchen gabapentin (NEURONTIN) 800 MG tablet Take 800 mg by mouth 3 (three) times daily.     . hydrochlorothiazide (HYDRODIURIL) 25 MG tablet Take 25  mg by mouth daily.     Marland Kitchen ibuprofen (ADVIL,MOTRIN) 800 MG tablet Take 1 tablet (800 mg total) by mouth every 8 (eight) hours as needed for mild pain or moderate pain. 30 tablet 0  . lovastatin (MEVACOR) 20 MG tablet Take 20 mg by mouth at bedtime.    . metFORMIN (GLUCOPHAGE) 1000 MG tablet Take 1,000 mg by mouth 2 (two) times daily as needed (for BS >200).     . metoprolol tartrate (LOPRESSOR) 25 MG tablet Take 25 mg by mouth 2 (two) times daily.     . Multiple Vitamins-Minerals (MULTIVITAMIN PO) Take 1 tablet by mouth daily.    . Omega-3 Fatty Acids (FISH OIL PO) Take 1 capsule by mouth daily.    . pantoprazole (PROTONIX) 40 MG tablet Take 40 mg by mouth 2 (two) times daily.     . RESTASIS 0.05 % ophthalmic emulsion Place 1 drop into both eyes 2 (two) times daily.      No current  facility-administered medications for this visit.     Review of Systems Review of Systems  Constitutional: Negative.   Respiratory: Negative.   Cardiovascular: Negative.     Blood pressure 118/62, pulse 74, temperature (!) 97.3 F (36.3 C), temperature source Skin, resp. rate 16, height 5\' 4"  (1.626 m), weight 165 lb (74.8 kg), SpO2 96 %.  Physical Exam Physical Exam Not performed.  Data Reviewed A. COLON POLYP X2, TRANSVERSE; HOT SNARE:  - TUBULOVILLOUS ADENOMA (MULTIPLE FRAGMENTS).  - NEGATIVE FOR HIGH-GRADE DYSPLASIA AND MALIGNANCY.   B. COLON POLYP, PROXIMAL TRANSVERSE; COLD BIOPSY:  - TUBULAR ADENOMA.  - NEGATIVE FOR HIGH-GRADE DYSPLASIA AND MALIGNANCY.   C. RECTUM POLYP, UPPER; HOT SNARE:  - TUBULAR ADENOMA.  - NEGATIVE FOR HIGH-GRADE DYSPLASIA AND MALIGNANCY.   Assessment Large sessile polyp in the hepatic proximal transverse colon that was not amenable to endoscopic resection.  Unusual late reaction to propofol.  Plan  The patient reports that about 5 hours after her endoscopy she felt warmth followed by redness involving the right ear across the right face and then to the left ear and then down the back.  This lasted about an hour and was relieved with Benadryl by mouth.  No prior similar reactions.  Review of the medical records shows that she received propofol and phenylephrine.   There is no evidence of malignancy or high-grade dysplasia.  The large sessile polyp behind a fold in the proximal transverse colon could not be resected during my exam.  Options for management were reviewed with the patient: 1) referral to [facility] with the idea that with 2-3 endoscopic attempts the area could be completely removed versus 2) right hemicolectomy.  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   Otherwise would make use of [facility].  The patient will consider her options and notify the office of how she would like to proceed.  HPI, Physical Exam, Assessment and Plan have been scribed under the direction and in the presence of Robert Bellow, MD. Jonnie Finner, CMA  I have completed the exam and reviewed the above documentation for accuracy and completeness.  I agree with the above.  Haematologist has been used and any errors in dictation or transcription are unintentional.  Hervey Ard, M.D., F.A.C.S.  Forest Gleason Tam Savoia 04/25/2018, 7:52 AM

## 2018-04-24 NOTE — Patient Instructions (Signed)
Patient will need to return to the office as needed.    Call the office with any questions or concerns. 

## 2018-04-25 ENCOUNTER — Encounter: Payer: Self-pay | Admitting: General Surgery

## 2018-04-30 DIAGNOSIS — Z9889 Other specified postprocedural states: Secondary | ICD-10-CM | POA: Insufficient documentation

## 2018-05-06 IMAGING — DX DG CHEST 2V
2 series · 2 of 2 positions shown · non-contrast
Comparison: 12/16/2011

CLINICAL DATA: Peripheral edema feet and ankles

EXAM:
CHEST  2 VIEW

[chest pa]
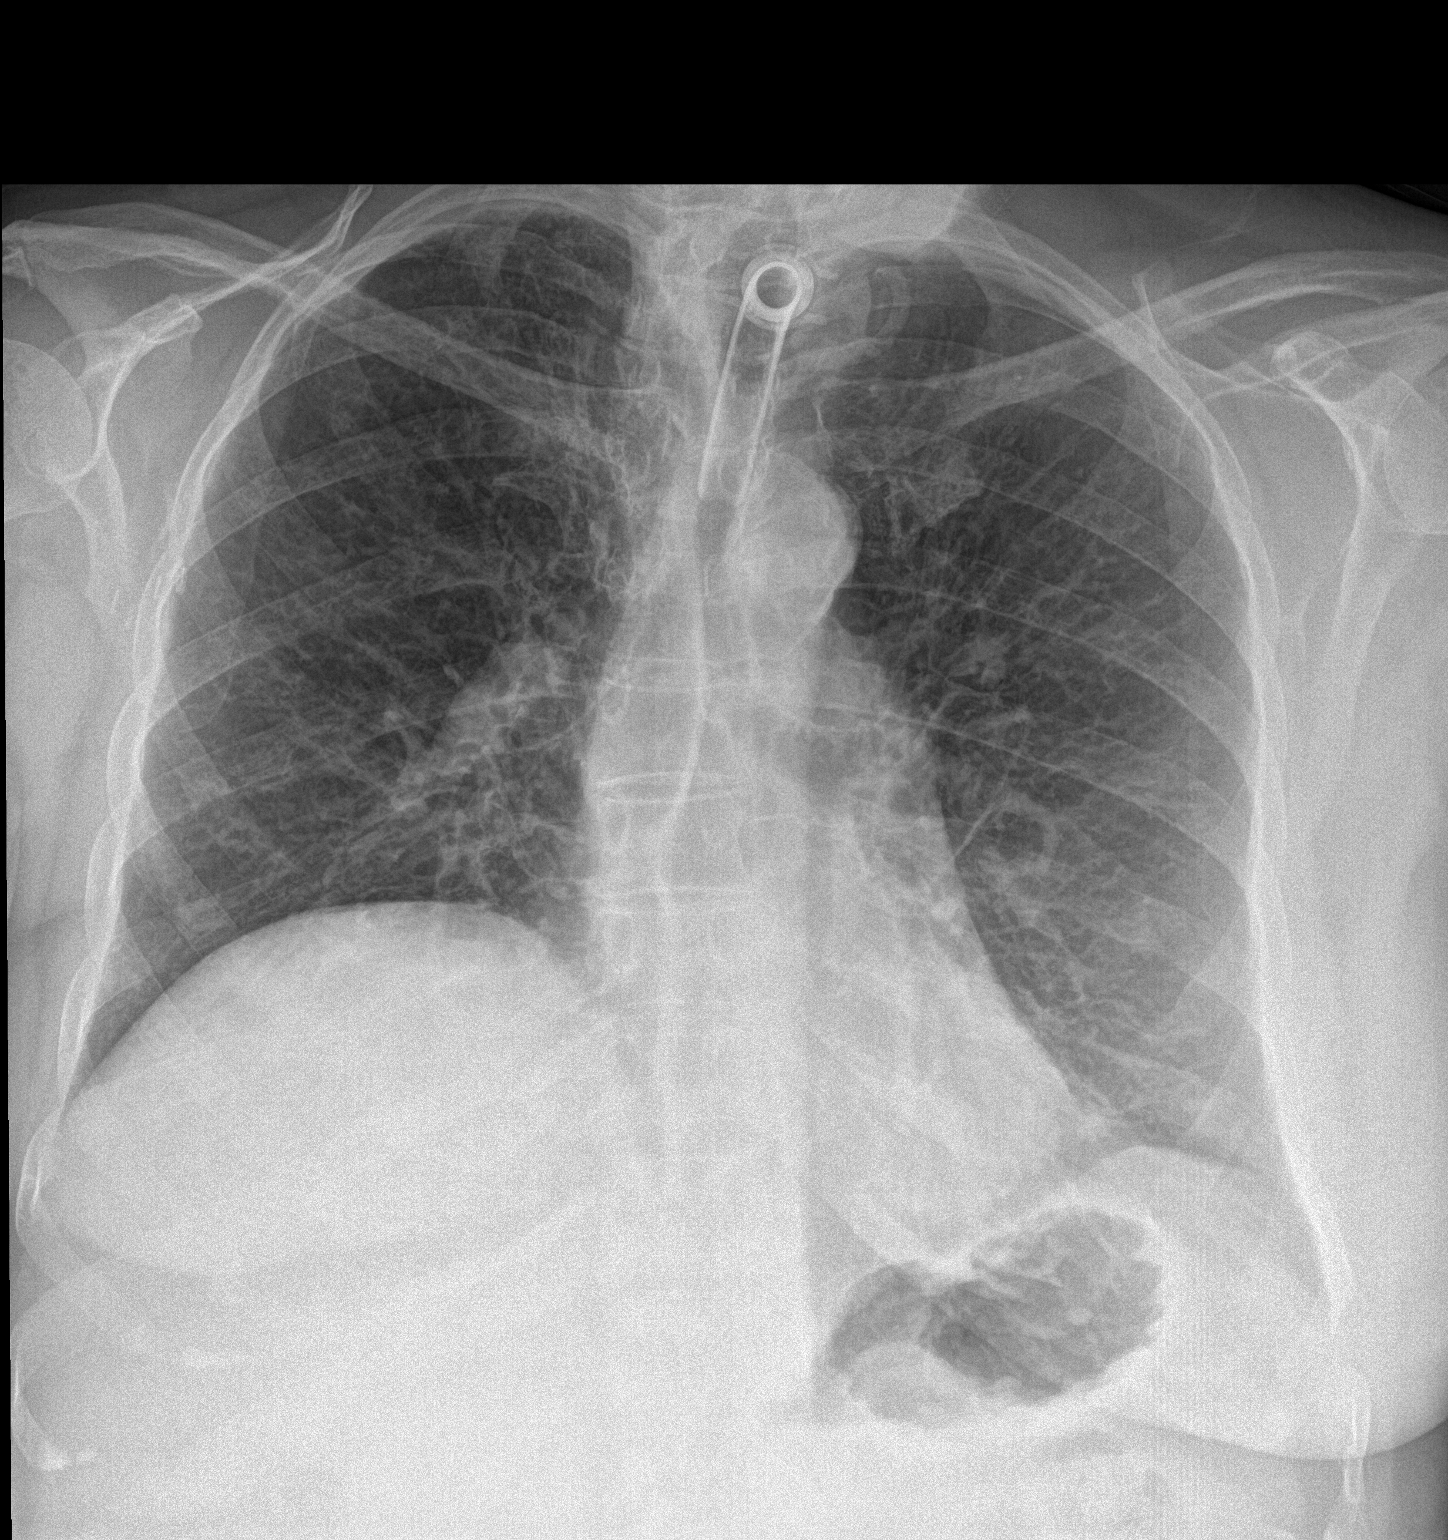

[chest lat]
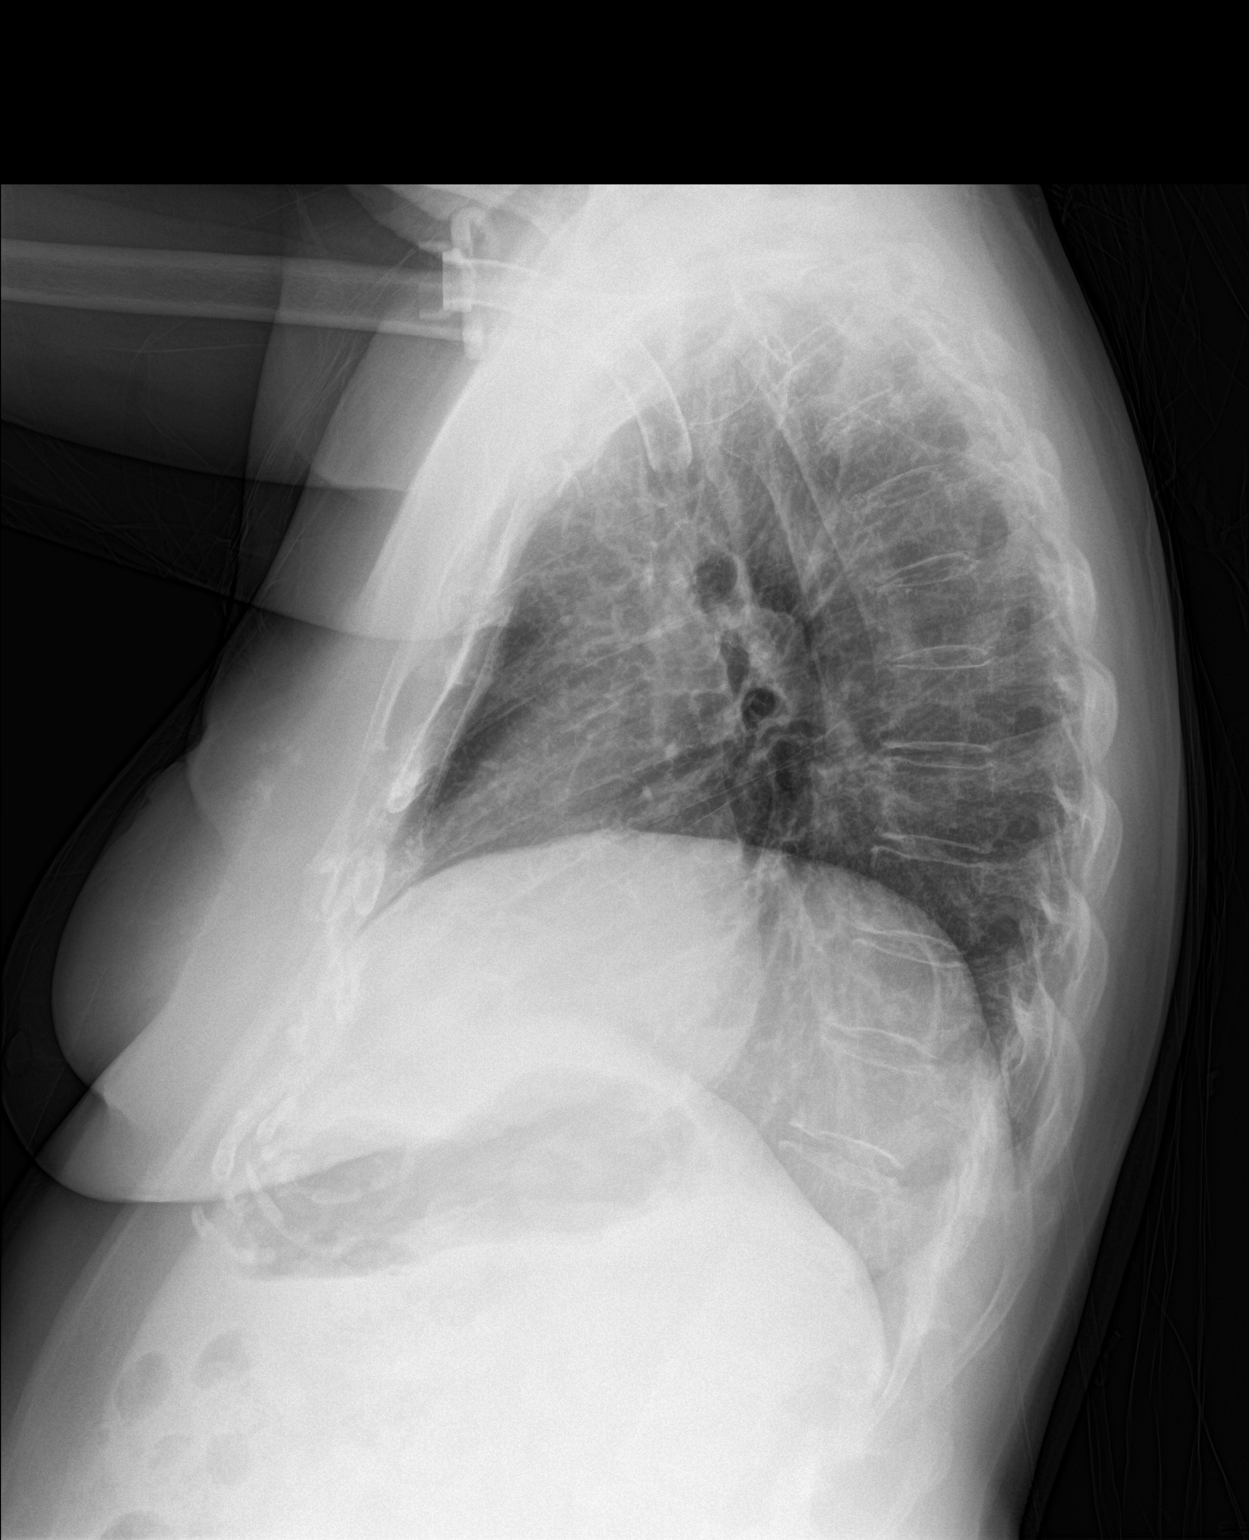

[2 of 2 positions shown; findings below may reference images not displayed]

FINDINGS: Cardiomediastinal silhouette is stable. Elevation of the right
hemidiaphragm again noted. Stable tracheostomy tube position. No
infiltrate or pleural effusion. No pulmonary edema. Bony thorax is
un-
IMPRESSION: No active cardiopulmonary disease. Stable tracheostomy tube
position. Chronic elevation of the right hemidiaphragm

## 2018-05-08 ENCOUNTER — Other Ambulatory Visit: Payer: Self-pay

## 2018-05-08 ENCOUNTER — Other Ambulatory Visit
Admission: RE | Admit: 2018-05-08 | Discharge: 2018-05-08 | Disposition: A | Discharge status: Home / Self Care | Payer: Medicare Other | Source: Ambulatory Visit | Attending: General Surgery | Admitting: General Surgery

## 2018-05-08 ENCOUNTER — Encounter: Payer: Self-pay | Admitting: General Surgery

## 2018-05-08 ENCOUNTER — Ambulatory Visit (INDEPENDENT_AMBULATORY_CARE_PROVIDER_SITE_OTHER): Payer: Medicare Other | Admitting: General Surgery

## 2018-05-08 VITALS — BP 88/58 | HR 75 | Temp 97.3°F | Ht 64.0 in | Wt 160.0 lb

## 2018-05-08 DIAGNOSIS — Z86010 Personal history of colonic polyps: Secondary | ICD-10-CM | POA: Insufficient documentation

## 2018-05-08 LAB — CBC WITH DIFFERENTIAL/PLATELET
Abs Immature Granulocytes: 0.03 10*3/uL (ref 0.00–0.07)
Basophils Absolute: 0 10*3/uL (ref 0.0–0.1)
Basophils Relative: 0 %
EOS ABS: 0.2 10*3/uL (ref 0.0–0.5)
Eosinophils Relative: 2 %
HEMATOCRIT: 41.7 % (ref 36.0–46.0)
Hemoglobin: 13 g/dL (ref 12.0–15.0)
Immature Granulocytes: 0 %
LYMPHS ABS: 2 10*3/uL (ref 0.7–4.0)
Lymphocytes Relative: 21 %
MCH: 28.3 pg (ref 26.0–34.0)
MCHC: 31.2 g/dL (ref 30.0–36.0)
MCV: 90.7 fL (ref 80.0–100.0)
Monocytes Absolute: 0.6 10*3/uL (ref 0.1–1.0)
Monocytes Relative: 6 %
Neutro Abs: 6.8 10*3/uL (ref 1.7–7.7)
Neutrophils Relative %: 71 %
Platelets: 204 10*3/uL (ref 150–400)
RBC: 4.6 MIL/uL (ref 3.87–5.11)
RDW: 12.7 % (ref 11.5–15.5)
WBC: 9.6 10*3/uL (ref 4.0–10.5)
nRBC: 0 % (ref 0.0–0.2)

## 2018-05-08 MED ORDER — NEOMYCIN SULFATE 500 MG PO TABS: ORAL_TABLET | ORAL | 0 refills | Status: DC

## 2018-05-08 MED ORDER — POLYETHYLENE GLYCOL 3350 17 GM/SCOOP PO POWD: ORAL | 0 refills | Status: DC

## 2018-05-08 MED ORDER — METRONIDAZOLE 500 MG PO TABS: ORAL_TABLET | ORAL | 0 refills | Status: DC

## 2018-05-08 NOTE — Patient Instructions (Addendum)
The patient is aware to call back for any questions or new concerns. Call Monday to let Dr Bary Castilla know if stools are still black and tarry.  Patient's surgery has been scheduled for 05/26/18 at [facility] with Dr. Bary Castilla. Arvilla Meres, RN will be assisting with this case.  The patient will be Pre-Admitting at the hospital and we will call her with that appointment date and time. Patient will check in at the [facility], [address] (first floor).  Miralax prep and antibiotics have been sent into the patient's pharmacy. The patient is aware to call the office should he/she have further questions.

## 2018-05-08 NOTE — Progress Notes (Signed)
Patient ID: Angela David, female   DOB: [birth year], 78 y.o.   MRN: [id number]  Chief Complaint  Patient presents with  . Other    discuss surgery     HPI Angela David is a 78 y.o. female here today to discuss surgery.  The patient had recently been identified with a large polyp I was not able to resect from the proximal transverse colon.  She had been given the opportunity of referral to [facility] or [facility] for possible endoscopic resection, and is decided to proceed to operative removal.  The patient reports that she was recently started on iron, and a few days later noted black, tarry stools.  Specifically, she did not describe gray, normal stools.  She is otherwise felt well.  No abdominal pain.  She does note that her blood pressure is lower than normal today. She is here with her husband, Angela David. HPI  Past Medical History:  Diagnosis Date  . Breast cancer ([place]) 2002   right breast chemo and radiation  . Cancer [facility]) 2002   right breast  . Diabetic peripheral neuropathy ([place]) 08/28/2016  . GERD (gastroesophageal reflux disease)   . Glaucoma   . High cholesterol   . History of colon polyps   . Peripheral neuropathy 08/28/2016  . Personal history of chemotherapy 2002   BREAST CA  . Personal history of malignant neoplasm of breast   . Personal history of radiation therapy 2002   BREAST CA  . Sleep apnea 2011  . Vocal cord paralysis 2012    Past Surgical History:  Procedure Laterality Date  . ABDOMINAL HYSTERECTOMY    . BREAST BIOPSY Right 2008   neg  . BREAST EXCISIONAL BIOPSY Right 2002   positive  . BREAST LUMPECTOMY Right 2002   chemo and radiation  . BREAST SURGERY Right 2002   LUMPECTOMY, radiation, chemotherapy  . CHOLECYSTECTOMY N/A 12/10/2017   Procedure: LAPAROSCOPIC CHOLECYSTECTOMY;  Surgeon: Benjamine Sprague, DO;  Location: [facility] ORS;  Service: General;  Laterality: N/A;  . COLONOSCOPY  2009,12/30/2013  . COLONOSCOPY WITH PROPOFOL N/A 04/16/2018   Tubulovillous polyp proximal transverse colon, large sessile polyp, proximal transverse colon;  Surgeon: Robert Bellow, MD;  Location: [facility] ENDOSCOPY;  Service: Endoscopy;  Laterality: N/A;  . HYDROCELE EXCISION / REPAIR    . TRACHEAL SURGERY  2013  . TRACHEOSTOMY N/A    Dr Kathyrn Sheriff    Family History  Problem Relation Age of Onset  . Cancer Father        bone cancer  . Cancer Sister        lymphoma  . Cancer Brother   . Breast cancer Neg Hx     Social History Social History   Tobacco Use  . Smoking status: Never Smoker  . Smokeless tobacco: Never Used  Substance Use Topics  . Alcohol use: No  . Drug use: No    Allergies  Allergen Reactions  . Codeine Other (See Comments)    Per patient "it kept her up all night"  . Eggs Or Egg-Derived Products Other (See Comments)    respitatory distress   . Eggshell Membrane (Chicken)  [Egg Shells]     Other reaction(s): Other (See Comments) respitatory distress  . Shellfish Allergy Other (See Comments)    respitatory distress   Other reaction(s): OTHER Other reaction(s): Other (See Comments) respitatory distress   . Sodium Sulfite Other (See Comments)    Unknown    Current Outpatient Medications  Medication Sig Dispense  Refill  . Biotin 1000 MCG tablet Take 1,000 mcg by mouth daily.     . busPIRone (BUSPAR) 15 MG tablet Take 30 mg by mouth 2 (two) times daily.     Marland Kitchen escitalopram (LEXAPRO) 20 MG tablet Take 20 mg by mouth daily.     . folic acid (FOLVITE) 1 MG tablet Take 1 mg by mouth daily.    Marland Kitchen gabapentin (NEURONTIN) 800 MG tablet Take 800 mg by mouth 3 (three) times daily.     . hydrochlorothiazide (HYDRODIURIL) 25 MG tablet Take 25 mg by mouth daily.     Marland Kitchen ibuprofen (ADVIL,MOTRIN) 800 MG tablet Take 1 tablet (800 mg total) by mouth every 8 (eight) hours as needed for mild pain or moderate pain. 30 tablet 0  . lovastatin (MEVACOR) 20 MG tablet Take 20 mg by mouth at bedtime.    . metFORMIN (GLUCOPHAGE) 1000 MG tablet  Take 1,000 mg by mouth 2 (two) times daily as needed (for BS >200).     . metoprolol tartrate (LOPRESSOR) 25 MG tablet Take 25 mg by mouth 2 (two) times daily.     . Multiple Vitamins-Minerals (MULTIVITAMIN PO) Take 1 tablet by mouth daily.    . Omega-3 Fatty Acids (FISH OIL PO) Take 1 capsule by mouth daily.    . pantoprazole (PROTONIX) 40 MG tablet Take 40 mg by mouth 2 (two) times daily.     . RESTASIS 0.05 % ophthalmic emulsion Place 1 drop into both eyes 2 (two) times daily.     . metroNIDAZOLE (FLAGYL) 500 MG tablet Take 1 tablet at 6 pm and take 1 tablet at 11 pm the evening prior to surgery. 2 tablet 0  . neomycin (MYCIFRADIN) 500 MG tablet Take 2 tablets at 6 pm, then take 2 tablets at 11 pm the evening prior to surgery. 4 tablet 0  . polyethylene glycol powder (GLYCOLAX/MIRALAX) powder Mix whole container with 64 ounces of clear liquids, No Red liquids. 255 g 0   No current facility-administered medications for this visit.     Review of Systems Review of Systems  Constitutional: Negative.   Respiratory: Negative.   Cardiovascular: Negative.     Blood pressure (!) 88/58, pulse 75, temperature (!) 97.3 F (36.3 C), temperature source Skin, height 5\' 4"  (1.626 m), weight 160 lb (72.6 kg), SpO2 92 %. Blood pressure at the time of the patient's initial visit on February 04, 2018 was 90/60.  Physical Exam Physical Exam Exam conducted with a chaperone present.  Constitutional:      Appearance: She is well-developed.  Eyes:     General: No scleral icterus.    Conjunctiva/sclera: Conjunctivae normal.  Neck:     Musculoskeletal: Neck supple.  Cardiovascular:     Rate and Rhythm: Normal rate and regular rhythm.     Heart sounds: Normal heart sounds.  Pulmonary:     Effort: Pulmonary effort is normal.     Breath sounds: Normal breath sounds.  Lymphadenopathy:     Cervical: No cervical adenopathy.  Skin:    General: Skin is warm and dry.  Neurological:     Mental Status: She  is alert and oriented to person, place, and time.  Psychiatric:        Behavior: Behavior normal.     Data Reviewed CBC of April 03, 2018 showed a white blood cell count 11,500 with a hemoglobin of 13.3.  In November 2019 hemoglobin was 14.4.  In October 2019 hemoglobin 13.7.  MCV unchanged 87.6.  Platelet count 200,000.  69% neutrophils, 24% lymphocytes. Creatinine 1.3, estimated GFR 40.  Normal electrolytes.  Normal liver function studies.  Urine culture showed E. coli, sensitive to all except cefuroxime. Cardiac echo dated December 05, 2017 showed an ejection fraction of 55%.  No aortic stenosis.  Mild mitral regurgitation. Pharmacological stress test of the same date: Normal.  Assessment Candidate for right hemicolectomy.  Recent change in stool color, possibly related to iron therapy.  Plan  Because of the patient's report of black, tarry stools it was elected to obtain a CBC today.  No interval change in her hemoglobin (13.2-13.0).  In the absence of any abdominal pain, early satiety, bloating or worsening reflux symptoms, I suspect that this does not represent an upper GI bleed.  She is 3 weeks out from her endoscopic polypectomy and excellent hemostasis was noted at the time, making me less suspicious, also I would have anticipated maroon stools from a lower GI source.  The patient is aware to call back for any questions or new concerns. Call Monday to let Dr Bary Castilla know if stools are still black and tarry, we may need to consider an upper endoscopy.  The surgical procedure for removal of the right colon was reviewed.  Indications for removal include any potential upstaging to malignancy with formal evaluation of the tubulovillous areas.  Anticipate three 1-day stay in the hospital.  Importance of early ambulation reviewed to minimize the risk of pneumonia, DVT and ileus.  HPI, Physical Exam, Assessment and Plan have been scribed under the direction and in the presence of  Hervey Ard, MD.  Gaspar Cola, CMA HPI, assessment, plan and physical exam has been scribed under the direction and in the presence of Robert Bellow, MD. Karie Fetch, RN  I have completed the exam and reviewed the above documentation for accuracy and completeness.  I agree with the above.  Haematologist has been used and any errors in dictation or transcription are unintentional.  Hervey Ard, M.D., F.A.C.S.  Patient's surgery has been scheduled for 05/26/18 at [facility] with Dr. Bary Castilla. Arvilla Meres, RN will be assisting with this case.  The patient will be Pre-Admitting at the hospital and we will call her with that appointment date and time. Patient will check in at the [place], [address] (first floor).  Miralax prep and antibiotics have been sent into the patient's pharmacy. The patient is aware to call the office should he/she have further questions.  Documented by Caryl-Lyn Otis Brace LPN  Forest Gleason Lamberto Dinapoli 05/09/2018, 10:22 AM

## 2018-05-09 ENCOUNTER — Telehealth: Payer: Self-pay

## 2018-05-09 NOTE — Telephone Encounter (Signed)
Call to notify of pre admit testing appointment. The patient will pre admit at the hospital in the [place] on 05/15/18 at 10:00 am. She is aware of date, time, and instructions.

## 2018-05-14 ENCOUNTER — Telehealth: Payer: Self-pay | Admitting: *Deleted

## 2018-05-14 NOTE — Telephone Encounter (Signed)
Patient called the office wanting to cancel surgery that was scheduled for 05-26-18 with Dr. Bary Castilla due to COVID-19.  The patient does not wish to reschedule at this time due to the unpredictability of the virus.   Patient wishes to call the office when she is ready to reschedule.

## 2018-05-15 ENCOUNTER — Inpatient Hospital Stay: Admission: RE | Admit: 2018-05-15 | Payer: Medicare Other | Source: Ambulatory Visit

## 2018-05-26 ENCOUNTER — Encounter: Admission: RE | Payer: Self-pay | Source: Home / Self Care

## 2018-05-26 ENCOUNTER — Inpatient Hospital Stay: Admission: RE | Admit: 2018-05-26 | Payer: Medicare Other | Source: Home / Self Care | Admitting: General Surgery

## 2018-05-26 DIAGNOSIS — S90851A Superficial foreign body, right foot, initial encounter: Secondary | ICD-10-CM | POA: Insufficient documentation

## 2018-05-26 SURGERY — LAPAROSCOPIC PARTIAL COLECTOMY
Anesthesia: General

## 2018-08-09 ENCOUNTER — Telehealth: Payer: Self-pay | Admitting: *Deleted

## 2018-08-09 NOTE — Telephone Encounter (Signed)
Called patient on home and cell number but no answer and not able to leave a message.   Surgery was previously cancelled due to COVID-19 restrictions.   We need to see if patient would like to get surgery rescheduled with Dr. Bary Castilla.   Patient will require a pre-op visit with Dr. Bary Castilla, a office pre-admit appointment, and COVID testing prior.

## 2018-08-18 NOTE — Telephone Encounter (Signed)
Tried to call patient on home and cell number again today.   No answer and not able to leave a message on either number.

## 2018-08-19 NOTE — Telephone Encounter (Signed)
Patient called the office back today.   The patient was notified that we can get her surgery rescheduled with Dr. Bary Castilla.   Patient states she has a lot going on right now and would like to think about this and call the office back. She is aware she will need COVID testing done prior to surgery.

## 2018-08-20 ENCOUNTER — Other Ambulatory Visit: Payer: Self-pay

## 2018-08-20 DIAGNOSIS — Z171 Estrogen receptor negative status [ER-]: Secondary | ICD-10-CM

## 2018-08-20 DIAGNOSIS — C50011 Malignant neoplasm of nipple and areola, right female breast: Secondary | ICD-10-CM

## 2018-08-20 DIAGNOSIS — C50811 Malignant neoplasm of overlapping sites of right female breast: Secondary | ICD-10-CM

## 2018-08-28 ENCOUNTER — Other Ambulatory Visit: Payer: Medicare Other

## 2018-09-01 NOTE — Telephone Encounter (Signed)
Patient has called back and would like to reschedule her surgery. Please call patient to discuss a surgery date.

## 2018-09-02 ENCOUNTER — Ambulatory Visit: Payer: Medicare Other | Admitting: Hematology and Oncology

## 2018-09-02 ENCOUNTER — Inpatient Hospital Stay: Payer: PRIVATE HEALTH INSURANCE | Admitting: Hematology and Oncology

## 2018-09-02 ENCOUNTER — Other Ambulatory Visit: Payer: Self-pay | Admitting: Hematology and Oncology

## 2018-09-02 ENCOUNTER — Ambulatory Visit: Payer: Medicare Other

## 2018-09-02 ENCOUNTER — Inpatient Hospital Stay: Payer: PRIVATE HEALTH INSURANCE

## 2018-09-02 DIAGNOSIS — R970 Elevated carcinoembryonic antigen [CEA]: Secondary | ICD-10-CM | POA: Insufficient documentation

## 2018-09-02 DIAGNOSIS — C50811 Malignant neoplasm of overlapping sites of right female breast: Secondary | ICD-10-CM

## 2018-09-02 DIAGNOSIS — Z171 Estrogen receptor negative status [ER-]: Secondary | ICD-10-CM

## 2018-09-02 DIAGNOSIS — Z7189 Other specified counseling: Secondary | ICD-10-CM | POA: Insufficient documentation

## 2018-09-02 DIAGNOSIS — K635 Polyp of colon: Secondary | ICD-10-CM | POA: Insufficient documentation

## 2018-09-02 NOTE — Telephone Encounter (Signed)
Spoke with the patient about rescheduling her surgery.   Patient's surgery to be scheduled for 09/22/18 at [facility] with Dr. Bary Castilla, Arvilla Meres RN will be assisting.   She will hold her Metformin the day of prep and procedure.   The patient is aware she will need to Pre-Admit. Patient will check in at the [place] entrance due to COVID-19 restrictions and will then be escorted to the [place], [address] (first floor). She will also have Covid-19 testing done that day Patient will be contacted once Pre-admission appointment has been arranged with date and time.   Patient states that she does still have her prescription for Miralax and her antibiotics.  Patient aware to be NPO after midnight and have a driver.   She is aware to check in at the [place] entrance where she will be screened for the coronavirus and then sent to Same Day Surgery.   Patient aware that she may have no visitors and driver will need to wait in the car due to COVID-19 restrictions.   The patient verbalizes understanding of the above.   The patient is aware to call the office should she have further questions.

## 2018-09-03 ENCOUNTER — Inpatient Hospital Stay: Payer: Medicare Other | Attending: Hematology and Oncology

## 2018-09-03 ENCOUNTER — Other Ambulatory Visit: Payer: Self-pay

## 2018-09-03 DIAGNOSIS — R970 Elevated carcinoembryonic antigen [CEA]: Secondary | ICD-10-CM | POA: Diagnosis not present

## 2018-09-03 DIAGNOSIS — Z171 Estrogen receptor negative status [ER-]: Secondary | ICD-10-CM

## 2018-09-03 DIAGNOSIS — C50811 Malignant neoplasm of overlapping sites of right female breast: Secondary | ICD-10-CM

## 2018-09-03 DIAGNOSIS — Z853 Personal history of malignant neoplasm of breast: Secondary | ICD-10-CM | POA: Insufficient documentation

## 2018-09-03 DIAGNOSIS — C50011 Malignant neoplasm of nipple and areola, right female breast: Secondary | ICD-10-CM

## 2018-09-03 DIAGNOSIS — M8588 Other specified disorders of bone density and structure, other site: Secondary | ICD-10-CM | POA: Diagnosis not present

## 2018-09-03 DIAGNOSIS — Z79899 Other long term (current) drug therapy: Secondary | ICD-10-CM | POA: Diagnosis not present

## 2018-09-03 LAB — COMPREHENSIVE METABOLIC PANEL
ALT: 17 U/L (ref 0–44)
AST: 20 U/L (ref 15–41)
Albumin: 4 g/dL (ref 3.5–5.0)
Alkaline Phosphatase: 58 U/L (ref 38–126)
Anion gap: 10 (ref 5–15)
BUN: 26 mg/dL — ABNORMAL HIGH (ref 8–23)
CO2: 26 mmol/L (ref 22–32)
Calcium: 10.1 mg/dL (ref 8.9–10.3)
Chloride: 104 mmol/L (ref 98–111)
Creatinine, Ser: 1.11 mg/dL — ABNORMAL HIGH (ref 0.44–1.00)
GFR calc Af Amer: 56 mL/min — ABNORMAL LOW (ref >60)
GFR calc non Af Amer: 49 mL/min — ABNORMAL LOW (ref >60)
Glucose, Bld: 92 mg/dL (ref 70–99)
Potassium: 4.5 mmol/L (ref 3.5–5.1)
Sodium: 140 mmol/L (ref 135–145)
Total Bilirubin: 0.8 mg/dL (ref 0.3–1.2)
Total Protein: 7.9 g/dL (ref 6.5–8.1)

## 2018-09-03 LAB — CBC WITH DIFFERENTIAL/PLATELET
Abs Immature Granulocytes: 0.05 10*3/uL (ref 0.00–0.07)
Basophils Absolute: 0 10*3/uL (ref 0.0–0.1)
Basophils Relative: 0 %
Eosinophils Absolute: 0.2 10*3/uL (ref 0.0–0.5)
Eosinophils Relative: 1 %
HCT: 40.5 % (ref 36.0–46.0)
Hemoglobin: 13 g/dL (ref 12.0–15.0)
Immature Granulocytes: 0 %
Lymphocytes Relative: 20 %
Lymphs Abs: 2.3 10*3/uL (ref 0.7–4.0)
MCH: 27.8 pg (ref 26.0–34.0)
MCHC: 32.1 g/dL (ref 30.0–36.0)
MCV: 86.5 fL (ref 80.0–100.0)
Monocytes Absolute: 0.5 10*3/uL (ref 0.1–1.0)
Monocytes Relative: 5 %
Neutro Abs: 8.4 10*3/uL — ABNORMAL HIGH (ref 1.7–7.7)
Neutrophils Relative %: 74 %
Platelets: 189 10*3/uL (ref 150–400)
RBC: 4.68 MIL/uL (ref 3.87–5.11)
RDW: 13 % (ref 11.5–15.5)
WBC: 11.4 10*3/uL — ABNORMAL HIGH (ref 4.0–10.5)
nRBC: 0 % (ref 0.0–0.2)

## 2018-09-03 NOTE — Progress Notes (Signed)
[facility]  [address], [address] [place], [place] [address] Phone: [phone]  Fax: [phone]   Clinic Day:  09/05/2018  Referring physician: Albina Billet, MD  Chief Complaint: Angela David is a 78 y.o. 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  HPI: The patient was diagnosed with stage II right breast cancer in 07/2000.  A nodule was found on screening mammogram. She underwent a lumpectomy with SLN sampling on 08/14/2000 by Dr. Jamal Collin.  Pathology revealed a 1.0 to 1.5 cm grade II IDC with clear surgical margins.  There was evidence of lymphatic invasion. Tumor was negative for estrogen receptor and HER-2, but positive for progesterone receptor.  A subsequent SLN and axillary dissection confirmed metastatic carcinoma in one node (details not available).  She received adjuvant chemotherapy with epirubicin and cyclophosphamide for 4 cycles (09/2000 - 01/2001) at [facility] in [place], [place].  She received right breast and chest wall radiation in 02/2001 - 03/2001. The patient received tamoxifen for five years (ended ~ 02/2006).   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  A permanent tracheostomy was placed in 09/2010.  She is followed by [facility] otol[facility].  Chest CT on 12/19/2010 revealed tracheitis distal to tracheostomy tube, small left effusion, indeterminate sclerotic and lytic bone lesions, and indeterminate right upper and right lower lobe nodules.  PET scan was recommended.  PET scan on 01/29/2011 revealed minimally FDG avid, partially calcified right breast lesion worrisome for malignancy.  There were bilateral subcentimeter pulmonary nodules, too small to characterize.  There was moderate left effusion.  There was no evidence of hypermetabolic osseous lesions.  Chest CT on 12/17/2011 revealed no evidence  of pulmonary embolism, but could not exclude pneumonitis or mild CHF. There were tiny subcentimeter pulmonary nodules in the right upper lobe. CT body on 09/16/2013 (result unavailable).   She was initially seen by Dr. Rogue Bussing on 12/27/2015.  She had no evidence of recurrent breast cancer.  CEA was 11 and felt artifactual.  CEA has been followed:  11.4 on 12/21/2015, 8.1 on 02/22/2016, 7.2 on 06/29/2016, 10.1 on 01/15/2017, 11.3 on 09/03/2017, 9.4 on 03/04/2018, and 9.6 on 09/03/2018.    CA27.29 has been followed:  37.5 on 12/21/2015, 31.4 on 06/29/2016, 29.5 on 01/04/2017, 24.4 on 09/03/2017, 27.8 on 03/04/2018, and 27.8 on 09/03/2018.  Bilateral mammogram on 01/29/2018 revealed no evidence of malignancy.  The patient was last seen in the medical oncology clinic by Dr. Rogue Bussing on 03/04/2018.  At that time, her abdominal discomfort had improved s/p cholecystectomy. She had chronic tingling and numbness of the lower extremities. Peripheral neuropathy was stable. She continued gabapentin.  She received Prolia.   Bone density on 03/25/2014 revealed osteopenia with a T-score of -1.4 in the left femoral neck.  Bone density on 04/09/2018 revealed osteopenia with a T-score of -1.4 in the right femoral neck.  She has received Prolia every 6 months (01/18/2016 - 03/04/2018).  Colonoscopy on 04/16/2018 by Dr Bary Castilla revealed two 6 to 12 mm polyps in the transverse colon (removed), one 45 mm polyp in the proximal transverse colon, and one 7 mm polyp at the recto-sigmoid colon (removed). Treatment for the larger 4.5 cm polyp in the transverse colon was unsuccessful.  The lesion was tattooed.  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  There was no dysplasia or malignancy.  She met with Dr. Bary Castilla on 04/24/2018 to discuss the large unresected polyp found on her colonoscopy. Two options were discussed: 1) referral to  [facility] or [facility] for endoscopies to attempt to remove the polyp or 2) right hemicolectomy. She opted for a right hemicolectomy. Surgery was postponed until 09/22/2018 secondary to COVID-19.   Symptomatically, she feels "okay." She notes she feels very unsteady. She was seen in [facility]; a scan of her back showed degenerative discs and arthritis.  She has been in PT for the past month with significant improvement.   She denies any fevers, sweats, or significant weight loss. She denies any abdominal pain, blood in her stools, black stools, or blood in her urine.  She reports she has chronic UTIs, and feels she has one right now because she has painful urination and urgency, in addition to nocturia.   She notes her nipple "doesn't feel quite right."  She does monthly exams. She has dysphagia, for which she saw Dr. Bary Castilla today; she Is scheduled for a swallowing test.   Her blood pressure in the clinic today is 87/57. She is currently on metoprolol. She denies any dizziness or lightheadedness. She is between PCPs and will see a new PCP in [place] on 09/10/2018.  She denies any family history of breast or ovarian cancer. Her father had prostate cancer with metastases to the bone, her sister had lymphoma, and her brother had pancreatic cancer. She has never undergone genetic testing and is not interested at this time.    Past Medical History:  Diagnosis Date  . Breast cancer ([place]) 2002   right breast chemo and radiation  . Cancer [facility]) 2002   right breast  . Diabetic peripheral neuropathy ([place]) 08/28/2016  . GERD (gastroesophageal reflux disease)   . Glaucoma   . High cholesterol   . History of colon polyps   . Peripheral neuropathy 08/28/2016  . Personal history of chemotherapy 2002   BREAST CA  . Personal history of malignant neoplasm of breast   . Personal history of radiation therapy 2002   BREAST CA  . Sleep apnea 2011  . Vocal cord paralysis 2012    Past Surgical History:   Procedure Laterality Date  . ABDOMINAL HYSTERECTOMY    . BREAST BIOPSY Right 2008   neg  . BREAST EXCISIONAL BIOPSY Right 2002   positive  . BREAST LUMPECTOMY Right 2002   chemo and radiation  . BREAST SURGERY Right 2002   LUMPECTOMY, radiation, chemotherapy  . CHOLECYSTECTOMY N/A 12/10/2017   Procedure: LAPAROSCOPIC CHOLECYSTECTOMY;  Surgeon: Benjamine Sprague, DO;  Location: [facility] ORS;  Service: General;  Laterality: N/A;  . COLONOSCOPY  2009,12/30/2013  . COLONOSCOPY WITH PROPOFOL N/A 04/16/2018   Tubulovillous polyp proximal transverse colon, large sessile polyp, proximal transverse colon;  Surgeon: Robert Bellow, MD;  Location: [facility] ENDOSCOPY;  Service: Endoscopy;  Laterality: N/A;  . HYDROCELE EXCISION / REPAIR    . TRACHEAL SURGERY  2013  . TRACHEOSTOMY N/A    Dr Kathyrn Sheriff    Family History  Problem Relation Age of Onset  . Cancer Father        bone cancer  . Cancer Sister        lymphoma  . Cancer Brother   . Breast cancer Neg Hx     Social History:  reports that she has never smoked. She has never used smokeless tobacco. She reports that she does not drink alcohol or  use drugs. She denies any smoking. She worked in [employer] (medical) for 26 years. She is married and her husband's name is [name].  She lives in [place].  The patient is alone today.  Allergies:  Allergies  Allergen Reactions  . Codeine Other (See Comments)    Per patient "it kept her up all night"  . Eggs Or Egg-Derived Products Other (See Comments)    respitatory distress   . Eggshell Membrane (Chicken) [Egg Shells]     respitatory distress  . Shellfish Allergy Other (See Comments)    respitatory distress   . Sodium Sulfite Other (See Comments)    Unknown    Current Medications: Current Outpatient Medications  Medication Sig Dispense Refill  . acetaminophen (TYLENOL) 500 MG tablet Take 1,000 mg by mouth every 6 (six) hours as needed for moderate pain.    . Aspirin Buf,CaCarb-MgCarb-MgO, 81 MG TABS Take  1 tablet by mouth as needed.     . Biotin 1000 MCG tablet Take 1,000 mcg by mouth daily.     . busPIRone (BUSPAR) 15 MG tablet Take 30 mg by mouth 2 (two) times daily.     . diclofenac sodium (VOLTAREN) 1 % GEL Apply 2 g topically as needed.     . docusate sodium (COLACE) 100 MG capsule Take 100 mg by mouth daily as needed.     Marland Kitchen escitalopram (LEXAPRO) 20 MG tablet Take 20 mg by mouth daily.     . fluconazole (DIFLUCAN) 150 MG tablet fluconazole 150 mg tablet    . folic acid (FOLVITE) 1 MG tablet Take 1 mg by mouth daily.    Marland Kitchen gabapentin (NEURONTIN) 600 MG tablet Take 600 mg by mouth 3 (three) times daily.     . Glucose Blood (COOL BLOOD GLUCOSE TEST STRIPS VI) CHECK GLUCOSE TWICE A DAY    . ibuprofen (ADVIL,MOTRIN) 800 MG tablet Take 1 tablet (800 mg total) by mouth every 8 (eight) hours as needed for mild pain or moderate pain. 30 tablet 0  . lovastatin (MEVACOR) 20 MG tablet Take 20 mg by mouth at bedtime.    . meclizine (ANTIVERT) 25 MG tablet Take 25 mg by mouth 2 (two) times daily as needed.     . Melatonin 5 MG CAPS Take 10 mg by mouth at bedtime.    . metFORMIN (GLUCOPHAGE) 1000 MG tablet Take 1,000 mg by mouth 2 (two) times daily as needed (for BS >110 in the morning and >150 in the evening).     . metoprolol tartrate (LOPRESSOR) 25 MG tablet Take 25 mg by mouth 2 (two) times daily.     . pantoprazole (PROTONIX) 40 MG tablet Take 40 mg by mouth daily.     . RESTASIS 0.05 % ophthalmic emulsion Place 1 drop into both eyes 2 (two) times daily.     Marland Kitchen rOPINIRole (REQUIP) 0.5 MG tablet Take 0.5 mg by mouth daily.     . metroNIDAZOLE (FLAGYL) 500 MG tablet Take 1 tablet at 6 pm and take 1 tablet at 11 pm the evening prior to surgery. (Patient not taking: Reported on 09/05/2018) 2 tablet 0  . neomycin (MYCIFRADIN) 500 MG tablet Take 2 tablets at 6 pm, then take 2 tablets at 11 pm the evening prior to surgery. (Patient not taking: Reported on 09/05/2018) 4 tablet 0  . polyethylene glycol powder  (GLYCOLAX/MIRALAX) powder Mix whole container with 64 ounces of clear liquids, No Red liquids. (Patient not taking: Reported on 09/05/2018) 255 g 0   No  current facility-administered medications for this visit.     Review of Systems  Constitutional: Negative.  Negative for chills, diaphoresis, fever, malaise/fatigue and weight loss.  HENT: Negative.  Negative for congestion, hearing loss, sinus pain and sore throat.        S/p tracheostomy   Eyes: Negative.  Negative for blurred vision.  Respiratory: Positive for shortness of breath (secondary to tracheostomy). Negative for cough and wheezing.   Cardiovascular: Negative.  Negative for chest pain, palpitations, orthopnea, leg swelling and PND.  Gastrointestinal: Negative.  Negative for abdominal pain, blood in stool, constipation, diarrhea, melena, nausea and vomiting.       Dysphagia.  Genitourinary: Positive for dysuria, frequency and urgency. Negative for hematuria.       Nocturia.  Musculoskeletal: Positive for back pain (degenerative discs) and falls. Negative for joint pain and myalgias.  Skin: Negative.  Negative for rash.  Neurological: Negative.  Negative for dizziness, tingling, sensory change, focal weakness, weakness and headaches.  Endo/Heme/Allergies: Negative.  Does not bruise/bleed easily.  Psychiatric/Behavioral: Negative.  Negative for depression, memory loss and substance abuse. The patient is not nervous/anxious and does not have insomnia.   All other systems reviewed and are negative.   Performance status (ECOG): 1  Vital Signs Blood pressure (!) 87/57, pulse 76, temperature 97.9 F (36.6 C), temperature source Tympanic, resp. rate 20, weight 158 lb 13.5 oz (72 kg).   Physical Exam  Constitutional: She is oriented to person, place, and time. She appears well-developed and well-nourished. No distress.  HENT:  Head: Normocephalic and atraumatic.  Mouth/Throat: Oropharynx is clear and moist. No oropharyngeal exudate.   Short white hair. Mask.   Eyes: Pupils are equal, round, and reactive to light. Conjunctivae and EOM are normal. No scleral icterus.  Glasses.   Neck: Normal range of motion. Neck supple.  Tracheostomy.  Cardiovascular: Normal rate, regular rhythm and normal heart sounds.  No murmur heard. Pulmonary/Chest: Effort normal and breath sounds normal. No respiratory distress. She has no wheezes. Right breast exhibits tenderness. Right breast exhibits no inverted nipple, no mass and no nipple discharge. Skin change: post-operative changes from 9 to 1 o'clock  Left breast exhibits tenderness. Left breast exhibits no inverted nipple, no mass and no nipple discharge. Skin change: fibrocystic changes 1 o'clock.  Abdominal: Soft. Bowel sounds are normal. She exhibits no distension and no mass. There is no abdominal tenderness. There is no rebound and no guarding.  Musculoskeletal: Normal range of motion.        General: No edema.  Lymphadenopathy:    She has no cervical adenopathy.    She has no axillary adenopathy.       Right: No supraclavicular adenopathy present.       Left: No supraclavicular adenopathy present.  Neurological: She is alert and oriented to person, place, and time. Gait (unsteady) abnormal.  Skin: Skin is warm and dry. She is not diaphoretic. No erythema.  Psychiatric: She has a normal mood and affect. Her behavior is normal. Judgment and thought content normal.  Nursing note and vitals reviewed.   Appointment on 09/03/2018  Component Date Value Ref Range Status  . CA 27.29 09/03/2018 27.8  0.0 - 38.6 U/mL Final   Comment: (NOTE) Siemens Centaur Immunochemiluminometric Methodology [facility]) Values obtained with different assay methods or kits cannot be used interchangeably. Results cannot be interpreted as absolute evidence of the presence or absence of malignant disease. Performed At: [facility] [place], [place] [id number] Rush Farmer MD  [id number]   . CEA 09/03/2018 9.6* 0.0 - 4.7 ng/mL Final   Comment: (NOTE)                             Nonsmokers          <3.9                             Smokers             <5.6 Roche Diagnostics Electrochemiluminescence Immunoassay (ECLIA) Values obtained with different assay methods or kits cannot be used interchangeably.  Results cannot be interpreted as absolute evidence of the presence or absence of malignant disease. Performed At: [facility] [place], [place] [id number] Rush Farmer MD [id number]   . Sodium 09/03/2018 140  135 - 145 mmol/L Final  . Potassium 09/03/2018 4.5  3.5 - 5.1 mmol/L Final  . Chloride 09/03/2018 104  98 - 111 mmol/L Final  . CO2 09/03/2018 26  22 - 32 mmol/L Final  . Glucose, Bld 09/03/2018 92  70 - 99 mg/dL Final  . BUN 09/03/2018 26* 8 - 23 mg/dL Final  . Creatinine, Ser 09/03/2018 1.11* 0.44 - 1.00 mg/dL Final  . Calcium 09/03/2018 10.1  8.9 - 10.3 mg/dL Final  . Total Protein 09/03/2018 7.9  6.5 - 8.1 g/dL Final  . Albumin 09/03/2018 4.0  3.5 - 5.0 g/dL Final  . AST 09/03/2018 20  15 - 41 U/L Final  . ALT 09/03/2018 17  0 - 44 U/L Final  . Alkaline Phosphatase 09/03/2018 58  38 - 126 U/L Final  . Total Bilirubin 09/03/2018 0.8  0.3 - 1.2 mg/dL Final  . GFR calc non Af Amer 09/03/2018 49* >60 mL/min Final  . GFR calc Af Amer 09/03/2018 56* >60 mL/min Final  . Anion gap 09/03/2018 10  5 - 15 Final   Performed at [facility], [address] [place],  [address]  . WBC 09/03/2018 11.4* 4.0 - 10.5 K/uL Final  . RBC 09/03/2018 4.68  3.87 - 5.11 MIL/uL Final  . Hemoglobin 09/03/2018 13.0  12.0 - 15.0 g/dL Final  . HCT 09/03/2018 40.5  36.0 - 46.0 % Final  . MCV 09/03/2018 86.5  80.0 - 100.0 fL Final  . MCH 09/03/2018 27.8  26.0 - 34.0 pg Final  . MCHC 09/03/2018 32.1  30.0 - 36.0 g/dL Final  . RDW 09/03/2018 13.0  11.5 - 15.5 % Final  . Platelets 09/03/2018 189  150 - 400 K/uL Final  . nRBC  09/03/2018 0.0  0.0 - 0.2 % Final  . Neutrophils Relative % 09/03/2018 74  % Final  . Neutro Abs 09/03/2018 8.4* 1.7 - 7.7 K/uL Final  . Lymphocytes Relative 09/03/2018 20  % Final  . Lymphs Abs 09/03/2018 2.3  0.7 - 4.0 K/uL Final  . Monocytes Relative 09/03/2018 5  % Final  . Monocytes Absolute 09/03/2018 0.5  0.1 - 1.0 K/uL Final  . Eosinophils Relative 09/03/2018 1  % Final  . Eosinophils Absolute 09/03/2018 0.2  0.0 - 0.5 K/uL Final  . Basophils Relative 09/03/2018 0  % Final  . Basophils Absolute 09/03/2018 0.0  0.0 - 0.1 K/uL Final  . Immature Granulocytes 09/03/2018 0  % Final  . Abs Immature Granulocytes 09/03/2018 0.05  0.00 - 0.07 K/uL Final   Performed at [facility], [place]  [address] [place], [place] [address]    Assessment:  Laressa Bolinger is a 78 y.o. female with stage II invasive right breast cancer s/p lumpectomy with sentinel lymph node biopsy on 08/14/2000.  Pathology revealed a 1.0 to 1.5 cm grade II IDC.  There was evidence of lymphatic invasion.  Margins were clear.  Tumor was ER-, PR+, and Her2/neu -. Subsequent SLN and axillary dissection confirmed metastatic carcinoma in 1 lymph node (pathology unavailable).  Pathologic stage was T1cN1M0.    She received epirubicin and cyclophosphamide (EC) for 4 cycles (09/2000 - 01/2001).  She received radiation in 02/2001 - 03/2001. She received tamoxifen for five years (ended ~ 02/2006).   Bilateral mammogram on 01/29/2018 revealed no evidence of malignancy.  CEA has been followed:  11.4 on 12/21/2015, 8.1 on 02/22/2016, 7.2 on 06/29/2016, 10.1 on 01/15/2017, 11.3 on 09/03/2017, 9.4 on 03/04/2018, and 9.6 on 09/03/2018.  CA27.29 has been followed:  37.5 on 12/21/2015, 31.4 on 06/30/2016, 29.5 on 01/04/2017, 24.4 on 09/03/2017, 27.8 on 03/04/2018, and 27.8 on 03/04/2018.  Bone density on 03/25/2014 revealed osteopenia with a T-score of -1.4 in the left femoral neck.  Bone density on 04/09/2018 revealed  osteopenia with a T-score of -1.4 in the right femoral neck.  She has received Prolia every 6 months (01/18/2016 - 03/04/2018).  Colonoscopy on 04/16/2018 revealed two 6 to 12 mm polyps in the transverse colon (removed), one 45 mm polyp in the proximal transverse colon, and one 7 mm polyp at the recto-sigmoid colon (removed).  Treatment for the larger 4.5 cm polyp in the transverse colon was unsuccessful.  The lesion was tatooed.  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  There was no dysplasia or malignancy.  She is scheduled for right hemicolectomy on 09/22/2018.  She is s/p tracheostomy for tracheal stenosis.  Symptomatically, she feels "ok".  She feels unsteady on her feet; she is going to physical therapy with noted improvement.  Exam reveals post-operative changes.  Plan: 1.   Labs today:  CBC with diff, CMP, CEA, CA27.29. 2.   Stage II right breast cancer  Review entire medical history, diagnosis and management of breast cancer.   She has undergone lumpectomy, adjuvant chemotherapy, radiation, and 5 years of tamoxifen.  Mammogram on 01/29/2018 revealed no evidence of disease.  Clinically, she is doing well without evidence of recurrence. 3.   Elevated CEA  Patient has had an elevated CEA since 11/2015.  Etiology is uncertain.  Discuss prior imaging studies in 1012 and 2013 (predating elevated CEA).  Discuss PET scan. 4.   Osteopenia  Review prior bone density studies.  Prolia today. 5.   Transverse colon polyp  Patient scheduled for right hemicolectomy on 09/22/2018. 6.   UTI symptoms  UA and culture today. 7.   RTC in 6 months for MD assessment, labs (CBC with diff, CMP, CA27.29, CEA), and Prolia.  I discussed the assessment and treatment plan with the patient.  The patient was provided an opportunity to ask questions and all were answered.  The patient agreed with the plan and demonstrated  an understanding of the instructions.  The patient was advised to call back if the symptoms worsen or if the condition fails to improve as anticipated.  I provided 30 minutes of face-to-face time during this this encounter and > 50% was spent counseling as documented under my assessment and plan.    Lequita Asal, MD, PhD  09/05/2018, 2:51 PM  I, Molly Dorshimer, am acting as Education administrator for [employer]. Mike Gip, MD, PhD.  I, Melissa C. Mike Gip, MD, have reviewed the above documentation for accuracy and completeness, and I agree with the above.

## 2018-09-04 ENCOUNTER — Telehealth: Payer: Self-pay | Admitting: *Deleted

## 2018-09-04 LAB — CANCER ANTIGEN 27.29: CA 27.29: 27.8 U/mL (ref 0.0–38.6)

## 2018-09-04 LAB — CEA: CEA: 9.6 ng/mL — ABNORMAL HIGH (ref 0.0–4.7)

## 2018-09-04 NOTE — Telephone Encounter (Signed)
Patient called the office today and states that she has been told in the past from Dr. Beverely Risen at [facility] that she possibly would need her esophagus stretched.   The patient states she got choked eating scrambled eggs this morning.   She isn't sure if Dr. Bary Castilla needs to discuss this with Dr. Tami Ribas.  Patient is also scheduled for surgery at the end of the month with Dr. Bary Castilla. Instructions were reviewed with her by phone today. She is aware of Pre-admit appointment and COVID testing date and times.   Patient is scheduled for a pre-op visit on 09-11-18.  Note routed to Dr. Bary Castilla to please advise.   Best number to reach patient is on her cell phone.

## 2018-09-05 ENCOUNTER — Other Ambulatory Visit: Payer: Self-pay

## 2018-09-05 ENCOUNTER — Inpatient Hospital Stay (HOSPITAL_BASED_OUTPATIENT_CLINIC_OR_DEPARTMENT_OTHER): Payer: Medicare Other | Admitting: Hematology and Oncology

## 2018-09-05 ENCOUNTER — Inpatient Hospital Stay: Payer: Medicare Other

## 2018-09-05 VITALS — BP 87/57 | HR 76 | Temp 97.9°F | Resp 20 | Wt 158.8 lb

## 2018-09-05 DIAGNOSIS — C50811 Malignant neoplasm of overlapping sites of right female breast: Secondary | ICD-10-CM

## 2018-09-05 DIAGNOSIS — M85851 Other specified disorders of bone density and structure, right thigh: Secondary | ICD-10-CM

## 2018-09-05 DIAGNOSIS — R3915 Urgency of urination: Secondary | ICD-10-CM

## 2018-09-05 DIAGNOSIS — Z853 Personal history of malignant neoplasm of breast: Secondary | ICD-10-CM | POA: Diagnosis not present

## 2018-09-05 DIAGNOSIS — Z171 Estrogen receptor negative status [ER-]: Secondary | ICD-10-CM

## 2018-09-05 DIAGNOSIS — Z8042 Family history of malignant neoplasm of prostate: Secondary | ICD-10-CM

## 2018-09-05 DIAGNOSIS — N23 Unspecified renal colic: Secondary | ICD-10-CM

## 2018-09-05 DIAGNOSIS — R1310 Dysphagia, unspecified: Secondary | ICD-10-CM

## 2018-09-05 DIAGNOSIS — Z930 Tracheostomy status: Secondary | ICD-10-CM

## 2018-09-05 DIAGNOSIS — R399 Unspecified symptoms and signs involving the genitourinary system: Secondary | ICD-10-CM

## 2018-09-05 DIAGNOSIS — M85852 Other specified disorders of bone density and structure, left thigh: Secondary | ICD-10-CM

## 2018-09-05 DIAGNOSIS — R970 Elevated carcinoembryonic antigen [CEA]: Secondary | ICD-10-CM

## 2018-09-05 DIAGNOSIS — M8588 Other specified disorders of bone density and structure, other site: Secondary | ICD-10-CM

## 2018-09-05 DIAGNOSIS — Z800 Family history of malignant neoplasm of digestive organs: Secondary | ICD-10-CM

## 2018-09-05 DIAGNOSIS — K635 Polyp of colon: Secondary | ICD-10-CM

## 2018-09-05 DIAGNOSIS — R351 Nocturia: Secondary | ICD-10-CM

## 2018-09-05 DIAGNOSIS — Z807 Family history of other malignant neoplasms of lymphoid, hematopoietic and related tissues: Secondary | ICD-10-CM

## 2018-09-05 LAB — URINALYSIS, COMPLETE (UACMP) WITH MICROSCOPIC
Bilirubin Urine: NEGATIVE
Glucose, UA: NEGATIVE mg/dL
Nitrite: POSITIVE — AB
Protein, ur: 30 mg/dL — AB
Specific Gravity, Urine: 1.025 (ref 1.005–1.030)
Squamous Epithelial / HPF: NONE SEEN (ref 0–5)
WBC, UA: >50 WBC/hpf (ref 0–5)
pH: 5.5 (ref 5.0–8.0)

## 2018-09-05 MED ORDER — DENOSUMAB 60 MG/ML ~~LOC~~ SOSY
60.0000 mg | PREFILLED_SYRINGE | Freq: Once | SUBCUTANEOUS | Status: AC
  Administered 2018-09-05: 60 mg via SUBCUTANEOUS

## 2018-09-05 NOTE — Progress Notes (Signed)
Pt here for follow up. C/O urinary symptoms (burning, frequency, and nocturia) x 3 weeks. States she is between PCP and does not see her new PCP until next Wednesday. Denies any fever or pain.

## 2018-09-05 NOTE — Patient Instructions (Signed)
Denosumab injection What is this medicine? DENOSUMAB (den oh sue mab) slows bone breakdown. Prolia is used to treat osteoporosis in women after menopause and in men, and in people who are taking corticosteroids for 6 months or more. Xgeva is used to treat a high calcium level due to cancer and to prevent bone fractures and other bone problems caused by multiple myeloma or cancer bone metastases. Xgeva is also used to treat giant cell tumor of the bone. This medicine may be used for other purposes; ask your health care provider or pharmacist if you have questions. COMMON BRAND NAME(S): Prolia, XGEVA What should I tell my health care provider before I take this medicine? They need to know if you have any of these conditions:  dental disease  having surgery or tooth extraction  infection  kidney disease  low levels of calcium or Vitamin D in the blood  malnutrition  on hemodialysis  skin conditions or sensitivity  thyroid or parathyroid disease  an unusual reaction to denosumab, other medicines, foods, dyes, or preservatives  pregnant or trying to get pregnant  breast-feeding How should I use this medicine? This medicine is for injection under the skin. It is given by a health care professional in a hospital or clinic setting. A special MedGuide will be given to you before each treatment. Be sure to read this information carefully each time. For Prolia, talk to your pediatrician regarding the use of this medicine in children. Special care may be needed. For Xgeva, talk to your pediatrician regarding the use of this medicine in children. While this drug may be prescribed for children as young as 13 years for selected conditions, precautions do apply. Overdosage: If you think you have taken too much of this medicine contact a poison control center or emergency room at once. NOTE: This medicine is only for you. Do not share this medicine with others. What if I miss a dose? It is  important not to miss your dose. Call your doctor or health care professional if you are unable to keep an appointment. What may interact with this medicine? Do not take this medicine with any of the following medications:  other medicines containing denosumab This medicine may also interact with the following medications:  medicines that lower your chance of fighting infection  steroid medicines like prednisone or cortisone This list may not describe all possible interactions. Give your health care provider a list of all the medicines, herbs, non-prescription drugs, or dietary supplements you use. Also tell them if you smoke, drink alcohol, or use illegal drugs. Some items may interact with your medicine. What should I watch for while using this medicine? Visit your doctor or health care professional for regular checks on your progress. Your doctor or health care professional may order blood tests and other tests to see how you are doing. Call your doctor or health care professional for advice if you get a fever, chills or sore throat, or other symptoms of a cold or flu. Do not treat yourself. This drug may decrease your body's ability to fight infection. Try to avoid being around people who are sick. You should make sure you get enough calcium and vitamin D while you are taking this medicine, unless your doctor tells you not to. Discuss the foods you eat and the vitamins you take with your health care professional. See your dentist regularly. Brush and floss your teeth as directed. Before you have any dental work done, tell your dentist you are   receiving this medicine. Do not become pregnant while taking this medicine or for 5 months after stopping it. Talk with your doctor or health care professional about your birth control options while taking this medicine. Women should inform their doctor if they wish to become pregnant or think they might be pregnant. There is a potential for serious side  effects to an unborn child. Talk to your health care professional or pharmacist for more information. What side effects may I notice from receiving this medicine? Side effects that you should report to your doctor or health care professional as soon as possible:  allergic reactions like skin rash, itching or hives, swelling of the face, lips, or tongue  bone pain  breathing problems  dizziness  jaw pain, especially after dental work  redness, blistering, peeling of the skin  signs and symptoms of infection like fever or chills; cough; sore throat; pain or trouble passing urine  signs of low calcium like fast heartbeat, muscle cramps or muscle pain; pain, tingling, numbness in the hands or feet; seizures  unusual bleeding or bruising  unusually weak or tired Side effects that usually do not require medical attention (report to your doctor or health care professional if they continue or are bothersome):  constipation  diarrhea  headache  joint pain  loss of appetite  muscle pain  runny nose  tiredness  upset stomach This list may not describe all possible side effects. Call your doctor for medical advice about side effects. You may report side effects to FDA at 1-800-FDA-1088. Where should I keep my medicine? This medicine is only given in a clinic, doctor's office, or other health care setting and will not be stored at home. NOTE: This sheet is a summary. It may not cover all possible information. If you have questions about this medicine, talk to your doctor, pharmacist, or health care provider.  2020 Elsevier/Gold Standard (2017-06-21 16:10:44)

## 2018-09-05 NOTE — Telephone Encounter (Signed)
Pt called again today requesting an update on message that was left on 09-04-2018 and routed to Dr. Bary Castilla. Please call patient on cell phone or house phone when time allows.

## 2018-09-05 NOTE — Telephone Encounter (Signed)
The patient called to report dysphasia.  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  She herself reports that she has had trouble swallowing for many years but over the last several weeks has become more pronounced.  She reports that sometimes having trouble swallowing.  When she is able to get a bolus of food or drink down she needs to swallow 2 or 3 additional times to have a clear the esophagus.  She does not describe frequently needing to regurgitate food.  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  She has been followed by [facility].  She is presently scheduled for a laparoscopically assisted right colectomy in the future for a large polyp that was not resectable endoscopically.  She questioned whether she should see ENT locally.  We will arrange for a modified barium swallow early next week and assess whether the issue at hand is in the proximal or distal esophagus.  She requested to see Tami Ribas, MD if ENT assessment is required.  Further follow-up will take place based on review of the barium swallow.

## 2018-09-07 ENCOUNTER — Encounter: Payer: Self-pay | Admitting: Hematology and Oncology

## 2018-09-08 ENCOUNTER — Other Ambulatory Visit: Payer: Self-pay

## 2018-09-08 ENCOUNTER — Telehealth: Payer: Self-pay

## 2018-09-08 LAB — URINE CULTURE: Culture: >=100000 — AB

## 2018-09-08 MED ORDER — AMOXICILLIN-POT CLAVULANATE 500-125 MG PO TABS: 1.0000 | ORAL_TABLET | Freq: Two times a day (BID) | ORAL | 0 refills | Status: AC

## 2018-09-08 NOTE — Telephone Encounter (Signed)
-----   Message from Lequita Asal, MD sent at 09/08/2018  8:34 AM EDT ----- Regarding: Please call patient  She has an E coli UTI, pan-sensitive.  She can be treated with   Ciprofloxacin 250 mg po BID x 3-5 days. Macrobid 100 mg BID x 5 days Septra BID x 3 days Augmentin 500 mg BID x 5-7 days  I am not aware of any allergies.  I know she has been treated multiple times in the past. We can defer treatment to Dr Ginette Pitman her PCP or we can send in a Rx.  M  ----- Message ----- From: Buel Ream, [facility] [place] Sent: 09/05/2018   3:49 PM EDT To: Lequita Asal, MD

## 2018-09-08 NOTE — Telephone Encounter (Signed)
Educated patient to take antibiotic in full, do not stop early. Informed patient to make PCP aware if she develops any diarrhea while on antibiotic. Patient verbalizes understanding and denies any further questions.

## 2018-09-08 NOTE — Telephone Encounter (Signed)
Contacted patient and informed her of her urine culture results. Informed her of options provided for treatment by Dr. Mike Gip. Pt prefers Augmentin 500 MG BID x 5-7 days as she has taken this before and had no issues. Will make Dr. Mike Gip aware so that antibiotic can be called in for patient.

## 2018-09-09 ENCOUNTER — Telehealth: Payer: Self-pay | Admitting: *Deleted

## 2018-09-09 NOTE — Telephone Encounter (Signed)
-----   Message from [facility], LPN sent at 09/23/209  1:57 PM EDT ----- Regarding: FW: Modified Barium swallow I spoke with special services and they only do one a day. The next date is 10/10/18. They are going to see if they can get the patient scheduled in the next 1-2 weeks. Please follow up on Monday or Tuesday about this.  Thanks ----- Message ----- From: Lesly Rubenstein, LPN Sent: 1/55/2080   1:34 PM EDT To: Dominga Ferry, CMA, [facility] Subject: Modified Barium swallow                        I have called Special Services [phone] to get this scheduled. They did not answer but I did leave a detailed message for them to scheduled this for next week and to call the patient to let them know about the appointment and also to let us know once this is scheduled. Could you look into this on Monday or Tuesday to see if it was taken care of.  ----- Message ----- From: Robert Bellow, MD Sent: 09/05/2018  12:47 PM EDT To: Lesly Rubenstein, LPN  Please arrange for a modified barium swallow ASAP: Dysphagia. Thanks.

## 2018-09-09 NOTE — Telephone Encounter (Signed)
Patient was contacted today and notified that modified barium swallow has been scheduled for tomorrow, 09-10-18 at 1 pm (arrive 12:30 pm). Prep: may have a regular breakfast and light lunch, no other prep.   The patient states she has another appointment tomorrow with PCP and will not be able to make this appointment.  I have called and left a message for [facility] Scheduling to cancel.   Patient states she is still having trouble swallowing food and sometimes it takes her 2 to 3 tries before she is able to get it down.   The patient does not wish to reschedule barium swallow at this time.   Patient states that she did see Dr. Mike Gip on Friday and states her cancer is back. She wanted to know if Dr. Mike Gip had been in touch with Dr. Bary Castilla about this. Also, states that Dr. Mike Gip has scheduled her for a PET scan for Tuesday.   The patient is scheduled to see Dr. Bary Castilla this Thursday, 09-11-18 for a pre-op visit.   Note routed to Dr. Bary Castilla.

## 2018-09-10 ENCOUNTER — Ambulatory Visit: Payer: Medicare Other

## 2018-09-11 ENCOUNTER — Encounter: Payer: Self-pay | Admitting: General Surgery

## 2018-09-11 ENCOUNTER — Other Ambulatory Visit: Payer: Self-pay

## 2018-09-11 ENCOUNTER — Ambulatory Visit: Payer: Medicare Other | Admitting: General Surgery

## 2018-09-11 VITALS — BP 130/66 | HR 101 | Temp 97.7°F | Ht 65.0 in | Wt 158.0 lb

## 2018-09-11 DIAGNOSIS — R1310 Dysphagia, unspecified: Secondary | ICD-10-CM

## 2018-09-11 NOTE — Progress Notes (Unsigned)
Patient ID: Angela David, female   DOB: 07-01-1940, 78 y.o.   MRN: [id number]  Chief Complaint  Patient presents with  . other    HPI Angela David is a 78 y.o. female here today for her pre op colectomy scheduled on 09/22/2018.  Past Medical History:  Diagnosis Date  . Breast cancer ([place]) 2002   right breast chemo and radiation  . Cancer [facility]) 2002   right breast  . Diabetic peripheral neuropathy ([place]) 08/28/2016  . GERD (gastroesophageal reflux disease)   . Glaucoma   . High cholesterol   . History of colon polyps   . Peripheral neuropathy 08/28/2016  . Personal history of chemotherapy 2002   BREAST CA  . Personal history of malignant neoplasm of breast   . Personal history of radiation therapy 2002   BREAST CA  . Sleep apnea 2011  . Vocal cord paralysis 2012    Past Surgical History:  Procedure Laterality Date  . ABDOMINAL HYSTERECTOMY    . BREAST BIOPSY Right 2008   neg  . BREAST EXCISIONAL BIOPSY Right 2002   positive  . BREAST LUMPECTOMY Right 2002   chemo and radiation  . BREAST SURGERY Right 2002   LUMPECTOMY, radiation, chemotherapy  . CHOLECYSTECTOMY N/A 12/10/2017   Procedure: LAPAROSCOPIC CHOLECYSTECTOMY;  Surgeon: Benjamine Sprague, DO;  Location: [facility] ORS;  Service: General;  Laterality: N/A;  . COLONOSCOPY  2009,12/30/2013  . COLONOSCOPY WITH PROPOFOL N/A 04/16/2018   Tubulovillous polyp proximal transverse colon, large sessile polyp, proximal transverse colon;  Surgeon: Robert Bellow, MD;  Location: [facility] ENDOSCOPY;  Service: Endoscopy;  Laterality: N/A;  . HYDROCELE EXCISION / REPAIR    . TRACHEAL SURGERY  2013  . TRACHEOSTOMY N/A    Dr Kathyrn Sheriff    Family History  Problem Relation Age of Onset  . Cancer Father        bone cancer  . Cancer Sister        lymphoma  . Cancer Brother   . Breast cancer Neg Hx     Social History Social History   Tobacco Use  . Smoking status: Never Smoker  . Smokeless tobacco: Never Used  Substance Use  Topics  . Alcohol use: No  . Drug use: No    Allergies  Allergen Reactions  . Shellfish Allergy Shortness Of Breath    respitatory distress   . Sodium Sulfite Shortness Of Breath    Severe Congestion that creates inability to breath  . Codeine Other (See Comments)    Per patient "it kept her up all night"    Current Outpatient Medications  Medication Sig Dispense Refill  . acetaminophen (TYLENOL) 500 MG tablet Take 1,000 mg by mouth every 6 (six) hours as needed for moderate pain.    Marland Kitchen amoxicillin-clavulanate (AUGMENTIN) 500-125 MG tablet Take 1 tablet (500 mg total) by mouth 2 (two) times a day for 7 days. 14 tablet 0  . Biotin 1000 MCG tablet Take 1,000 mcg by mouth daily.     . busPIRone (BUSPAR) 15 MG tablet Take 30 mg by mouth 2 (two) times daily.     Marland Kitchen escitalopram (LEXAPRO) 10 MG tablet Take 10 mg by mouth daily.     Marland Kitchen escitalopram (LEXAPRO) 20 MG tablet Take 20 mg by mouth daily.    . folic acid (FOLVITE) 1 MG tablet Take 1 mg by mouth daily.    Marland Kitchen gabapentin (NEURONTIN) 600 MG tablet Take 600 mg by mouth 3 (three) times daily.     Marland Kitchen  Glucose Blood (COOL BLOOD GLUCOSE TEST STRIPS VI) CHECK GLUCOSE TWICE A DAY    . ibuprofen (ADVIL,MOTRIN) 800 MG tablet Take 1 tablet (800 mg total) by mouth every 8 (eight) hours as needed for mild pain or moderate pain. 30 tablet 0  . iron polysaccharides (NIFEREX) 150 MG capsule Take 150 mg by mouth daily.    Marland Kitchen lovastatin (MEVACOR) 20 MG tablet Take 20 mg by mouth at bedtime.    . meclizine (ANTIVERT) 25 MG tablet Take 25 mg by mouth 2 (two) times daily as needed for dizziness.     . Melatonin 5 MG CAPS Take 5 mg by mouth at bedtime.     . metFORMIN (GLUCOPHAGE) 1000 MG tablet Take 1,000 mg by mouth 2 (two) times daily as needed (for BS >110 in the morning and >150 in the evening).     . metroNIDAZOLE (FLAGYL) 500 MG tablet Take 1 tablet at 6 pm and take 1 tablet at 11 pm the evening prior to surgery. 2 tablet 0  . neomycin (MYCIFRADIN) 500 MG  tablet Take 2 tablets at 6 pm, then take 2 tablets at 11 pm the evening prior to surgery. 4 tablet 0  . pantoprazole (PROTONIX) 40 MG tablet Take 40 mg by mouth daily.     . polyethylene glycol powder (GLYCOLAX/MIRALAX) powder Mix whole container with 64 ounces of clear liquids, No Red liquids. 255 g 0  . RESTASIS 0.05 % ophthalmic emulsion Place 1 drop into both eyes 2 (two) times daily.     Marland Kitchen rOPINIRole (REQUIP) 0.5 MG tablet Take 0.5 mg by mouth at bedtime.      No current facility-administered medications for this visit.     Review of Systems Review of Systems  Constitutional: Negative.   Respiratory: Negative.   Cardiovascular: Negative.     Blood pressure 130/66, pulse (!) 101, temperature 97.7 F (36.5 C), temperature source Skin, height 5\' 5"  (1.651 m), weight 158 lb (71.7 kg), SpO2 95 %.  Physical Exam Physical Exam Constitutional:      Appearance: Normal appearance.  Neurological:     General: No focal deficit present.     Mental Status: She is alert and oriented to person, place, and time.     Data Reviewed ***  Assessment ***  Plan  Patient to have her PET scan done. Modified barium swallow  Is Scheduled on 10/17/2018 at 1:00pm  (arrive 12:30 pm). Prep: may have a regular breakfast and light lunch, no other prep. after PET scan. Ref to  [facility] . We are going to rescheduled colectomy.   HPI, Physical Exam, Assessment and Plan have been scribed under the direction and in the presence of Hervey Ard, MD.  Gaspar Cola, CMA   Gaspar Cola 09/11/2018, 3:01 PM

## 2018-09-11 NOTE — Patient Instructions (Signed)
  Patient to have her PET scan done. We are going to rescheduled colectomy.

## 2018-09-15 ENCOUNTER — Encounter: Admission: RE | Admit: 2018-09-15 | Payer: Medicare Other | Source: Ambulatory Visit

## 2018-09-16 ENCOUNTER — Other Ambulatory Visit: Payer: Self-pay

## 2018-09-16 ENCOUNTER — Ambulatory Visit
Admission: RE | Admit: 2018-09-16 | Discharge: 2018-09-16 | Disposition: A | Discharge status: Home / Self Care | Payer: Medicare Other | Source: Ambulatory Visit | Attending: Hematology and Oncology | Admitting: Hematology and Oncology

## 2018-09-16 DIAGNOSIS — C50811 Malignant neoplasm of overlapping sites of right female breast: Secondary | ICD-10-CM | POA: Insufficient documentation

## 2018-09-16 DIAGNOSIS — Z9221 Personal history of antineoplastic chemotherapy: Secondary | ICD-10-CM | POA: Diagnosis not present

## 2018-09-16 DIAGNOSIS — Z171 Estrogen receptor negative status [ER-]: Secondary | ICD-10-CM | POA: Diagnosis not present

## 2018-09-16 DIAGNOSIS — R970 Elevated carcinoembryonic antigen [CEA]: Secondary | ICD-10-CM | POA: Diagnosis not present

## 2018-09-16 DIAGNOSIS — E1142 Type 2 diabetes mellitus with diabetic polyneuropathy: Secondary | ICD-10-CM | POA: Insufficient documentation

## 2018-09-16 LAB — GLUCOSE, CAPILLARY: Glucose-Capillary: 140 mg/dL — ABNORMAL HIGH (ref 70–99)

## 2018-09-16 MED ORDER — FLUDEOXYGLUCOSE F - 18 (FDG) INJECTION
8.2000 | Freq: Once | INTRAVENOUS | Status: AC | PRN
  Administered 2018-09-16: 8 via INTRAVENOUS

## 2018-09-18 ENCOUNTER — Other Ambulatory Visit: Payer: Medicare Other

## 2018-09-19 ENCOUNTER — Encounter: Payer: Self-pay | Admitting: General Surgery

## 2018-09-22 ENCOUNTER — Encounter: Admission: RE | Payer: Self-pay | Source: Home / Self Care

## 2018-09-22 ENCOUNTER — Inpatient Hospital Stay: Admission: RE | Admit: 2018-09-22 | Payer: Medicare Other | Source: Home / Self Care | Admitting: General Surgery

## 2018-09-22 SURGERY — COLECTOMY, SIGMOID, LAPAROSCOPIC
Anesthesia: General | Laterality: Right

## 2018-09-23 ENCOUNTER — Telehealth: Payer: Self-pay | Admitting: *Deleted

## 2018-09-23 NOTE — Telephone Encounter (Signed)
Patient called and wants to know results from her PET scan that was done on 09/16/18

## 2018-09-24 NOTE — Telephone Encounter (Signed)
Please call the pt PET scan shows no evidence of  Metastatasis

## 2018-09-30 NOTE — Telephone Encounter (Signed)
Left message for patient to call the office back for results

## 2018-10-08 ENCOUNTER — Ambulatory Visit (INDEPENDENT_AMBULATORY_CARE_PROVIDER_SITE_OTHER): Payer: Medicare Other | Admitting: Gastroenterology

## 2018-10-08 ENCOUNTER — Other Ambulatory Visit: Payer: Self-pay

## 2018-10-08 ENCOUNTER — Encounter: Payer: Self-pay | Admitting: *Deleted

## 2018-10-08 ENCOUNTER — Encounter: Payer: Self-pay | Admitting: Gastroenterology

## 2018-10-08 VITALS — BP 93/52 | HR 75 | Temp 97.5°F | Ht 65.0 in | Wt 157.2 lb

## 2018-10-08 VITALS — BP 93/52 | HR 75 | Temp 97.5°F | Ht 65.0 in | Wt 157.0 lb

## 2018-10-08 DIAGNOSIS — R4702 Dysphasia: Secondary | ICD-10-CM | POA: Diagnosis not present

## 2018-10-08 DIAGNOSIS — R1310 Dysphagia, unspecified: Secondary | ICD-10-CM

## 2018-10-08 DIAGNOSIS — K635 Polyp of colon: Secondary | ICD-10-CM | POA: Diagnosis not present

## 2018-10-08 DIAGNOSIS — R1319 Other dysphagia: Secondary | ICD-10-CM

## 2018-10-08 NOTE — H&P (View-Only) (Signed)
Gastroenterology Consultation  Referring Provider:     Bobby Rumpf, MD Primary Care Physician:  Bobby Rumpf, MD Primary Gastroenterologist:  Dr. Allen Norris     Reason for Consultation:     Dysphasia        HPI:   Angela David is a 78 y.o. y/o female referred for consultation & management of dysphasia by Dr. Celesta Aver, Juanda Bond, MD.  This patient comes in today with a complicated past medical history including a colonoscopy back in 2015 with a large polyp removed by Dr. Jamal Collin without a repeat colonoscopy until February of this year by Dr. Bary Castilla with a reported 45 mm polyp seen.  The area was biopsied and tattooed.  It was recommended that the patient have the lesion removed.  The patient also had reflux symptoms and was seen by Dr. Alice Reichert at the end of last year.  At that time the patient states her symptoms went away so she did not follow-up with the recommended EGD they had suggested for her.  The patient now reports that she has been having problems with swallowing and it is mostly with water.  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 The patient has failed to have surgery to remove her large polyp as recommended by Dr. Bary Castilla.  The patient states that she is able to drink milkshakes but not water.  She also reports that she does not even try to eat anything bulky for fear of it getting stuck.  The patient has lost approximately 8 pounds in the last few weeks due to the dysphasia.  Past Medical History:  Diagnosis Date   Breast cancer ([place]) 2002   right breast chemo and radiation   Cancer ([place]) 2002   right breast   Diabetic peripheral neuropathy ([place]) 08/28/2016   GERD (gastroesophageal reflux disease)    Glaucoma    High cholesterol    History of colon polyps    Peripheral neuropathy 08/28/2016   Personal history of chemotherapy 2002   BREAST CA   Personal history of malignant neoplasm of breast     Personal history of radiation therapy 2002   BREAST CA   Sleep apnea 2011   Vocal cord paralysis 2012    Past Surgical History:  Procedure Laterality Date   ABDOMINAL HYSTERECTOMY     BREAST BIOPSY Right 2008   neg   BREAST EXCISIONAL BIOPSY Right 2002   positive   BREAST LUMPECTOMY Right 2002   chemo and radiation   BREAST SURGERY Right 2002   LUMPECTOMY, radiation, chemotherapy   CHOLECYSTECTOMY N/A 12/10/2017   Procedure: LAPAROSCOPIC CHOLECYSTECTOMY;  Surgeon: Benjamine Sprague, DO;  Location: [facility] ORS;  Service: General;  Laterality: N/A;   COLONOSCOPY  2009,12/30/2013   COLONOSCOPY WITH PROPOFOL N/A 04/16/2018   Tubulovillous polyp proximal transverse colon, large sessile polyp, proximal transverse colon;  Surgeon: Robert Bellow, MD;  Location: [facility] ENDOSCOPY;  Service: Endoscopy;  Laterality: N/A;   HYDROCELE EXCISION / REPAIR     TRACHEAL SURGERY  2013   TRACHEOSTOMY N/A    Dr Kathyrn Sheriff    Prior to Admission medications   Medication Sig Start Date End Date Taking? Authorizing Provider  acetaminophen (TYLENOL) 500 MG tablet Take 1,000 mg by mouth every 6 (six) hours as needed for moderate pain.    [provider]  Biotin 1000 MCG tablet Take 1,000 mcg by mouth daily.     [provider]  busPIRone (BUSPAR) 15 MG tablet Take 30 mg by mouth 2 (two) times daily.     [provider]  escitalopram (LEXAPRO) 10 MG tablet Take 10 mg by mouth daily.  01/13/15   [provider]  escitalopram (LEXAPRO) 20 MG tablet Take 20 mg by mouth daily.    [provider]  folic acid (FOLVITE) 1 MG tablet Take 1 mg by mouth daily.    [provider]  gabapentin (NEURONTIN) 600 MG tablet Take 600 mg by mouth 3 (three) times daily.     [provider]  Glucose Blood (COOL BLOOD GLUCOSE TEST STRIPS VI) CHECK GLUCOSE TWICE A DAY 07/28/18   [provider]  ibuprofen (ADVIL,MOTRIN) 800 MG tablet Take 1 tablet (800 mg  total) by mouth every 8 (eight) hours as needed for mild pain or moderate pain. 12/10/17   Lysle Pearl, Isami, DO  iron polysaccharides (NIFEREX) 150 MG capsule Take 150 mg by mouth daily.    [provider]  lovastatin (MEVACOR) 20 MG tablet Take 20 mg by mouth at bedtime.    [provider]  meclizine (ANTIVERT) 25 MG tablet Take 25 mg by mouth 2 (two) times daily as needed for dizziness.     [provider]  Melatonin 5 MG CAPS Take 5 mg by mouth at bedtime.     [provider]  metFORMIN (GLUCOPHAGE) 1000 MG tablet Take 1,000 mg by mouth 2 (two) times daily as needed (for BS >110 in the morning and >150 in the evening).     [provider]  metroNIDAZOLE (FLAGYL) 500 MG tablet Take 1 tablet at 6 pm and take 1 tablet at 11 pm the evening prior to surgery. 05/08/18   Robert Bellow, MD  neomycin (MYCIFRADIN) 500 MG tablet Take 2 tablets at 6 pm, then take 2 tablets at 11 pm the evening prior to surgery. 05/08/18   Robert Bellow, MD  pantoprazole (PROTONIX) 40 MG tablet Take 40 mg by mouth daily.     [provider]  polyethylene glycol powder (GLYCOLAX/MIRALAX) powder Mix whole container with 64 ounces of clear liquids, No Red liquids. Patient not taking: Reported on 10/08/2018 05/08/18   Robert Bellow, MD  RESTASIS 0.05 % ophthalmic emulsion Place 1 drop into both eyes 2 (two) times daily.     [provider]  rOPINIRole (REQUIP) 0.5 MG tablet Take 0.5 mg by mouth at bedtime.  06/09/18   [provider]    Family History  Problem Relation Age of Onset   Cancer Father        bone cancer   Cancer Sister        lymphoma   Cancer Brother    Breast cancer Neg Hx      Social History   Tobacco Use   Smoking status: Never Smoker   Smokeless tobacco: Never Used  Substance Use Topics   Alcohol use: No   Drug use: No    Allergies as of 10/08/2018 - Review Complete 10/08/2018  Allergen Reaction Noted    Shellfish allergy Shortness Of Breath 07/25/2012   Sodium sulfite Shortness Of Breath 12/14/2013   Codeine Other (See Comments) 11/26/2012    Review of Systems:    All systems reviewed and negative except where noted in HPI.   Physical Exam:  There were no vitals taken for this visit. No LMP recorded. Patient has had a hysterectomy. General:   Alert,  Well-developed, well-nourished, pleasant and cooperative in NAD  Head:  Normocephalic and atraumatic. Eyes:  Sclera clear, no icterus.   Conjunctiva pink. Ears:  Normal auditory acuity. Nose:  No deformity, discharge, or lesions. Mouth:  No deformity or lesions,oropharynx pink & moist. Neck:  Supple; no masses or thyromegaly tracheostomy in place Lungs:  Respirations even and unlabored.  Clear throughout to auscultation.   No wheezes, crackles, or rhonchi. No acute distress. Heart:  Regular rate and rhythm; no murmurs, clicks, rubs, or gallops. Abdomen:  Normal bowel sounds.  No bruits.  Soft, non-tender and non-distended without masses, hepatosplenomegaly or hernias noted.  No guarding or rebound tenderness.  Negative Carnett sign.   Rectal:  Deferred.  Msk:  Symmetrical without gross deformities.  Good, equal movement & strength bilaterally. Pulses:  Normal pulses noted. Extremities:  No clubbing or edema.  No cyanosis. Neurologic:  Alert and oriented x3;  grossly normal neurologically. Skin:  Intact without significant lesions or rashes.  No jaundice. Lymph Nodes:  No significant cervical adenopathy. Psych:  Alert and cooperative. Normal mood and affect.  Imaging Studies: Nm Pet Image Restag (ps) Skull Base To Thigh  Result Date: 09/16/2018 CLINICAL DATA:  Subsequent treatment strategy for breast carcinoma. Persistent elevation of CEA level. EXAM: NUCLEAR MEDICINE PET SKULL BASE TO THIGH TECHNIQUE: 8.0 mCi F-18 FDG was injected intravenously. Full-ring PET imaging was performed from the skull base to thigh after the radiotracer. CT  data was obtained and used for attenuation correction and anatomic localization. Fasting blood glucose: 140 mg/dl COMPARISON:  12/26/2010 FINDINGS: Mediastinal blood-pool activity (background): SUV max = 2.7 Liver activity (reference): SUV max = N/A NECK:  No hypermetabolic lymph nodes or masses. Incidental CT findings:  None. CHEST: No hypermetabolic masses or lymphadenopathy. No hypermetabolic pulmonary nodules or masses. Sub-cm bilateral pulmonary nodules remain stable, consistent with benign postinflammatory etiology. Right upper lobe scarring is stable. There has been resolution of left pleural effusion since prior exam. Tracheostomy tube noted in appropriate position. Incidental CT findings:  None. ABDOMEN/PELVIS: No abnormal hypermetabolic activity within the liver, pancreas, adrenal glands, or spleen. No hypermetabolic lymph nodes in the abdomen or pelvis. Incidental CT findings:  Prior cholecystectomy and hysterectomy. SKELETON: No focal hypermetabolic bone lesions to suggest skeletal metastasis. Incidental CT findings:  None. IMPRESSION: No evidence of metabolically active malignancy or metastatic disease. Electronically Signed   By: Marlaine Hind M.D.   On: 09/16/2018 16:14    Assessment and Plan:   Angela David is a 78 y.o. y/o female who comes in today with a history of a tracheostomy reflux and now reports dysphasia.  The patient has lost weight with the dysphasia.  She has been told the importance of having her large polyp removed surgically and will be set up with Dr.Pabon.  The patient will also be set up for an EGD due to her dysphasia.  The patient has been explained the plan and agrees with it.  Lucilla Lame, MD. Marval Regal    Note: This dictation was prepared with Dragon dictation along with smaller phrase technology. Any transcriptional errors that result from this process are unintentional.

## 2018-10-08 NOTE — Progress Notes (Signed)
Gastroenterology Consultation  Referring Provider:     Bobby Rumpf, MD Primary Care Physician:  Bobby Rumpf, MD Primary Gastroenterologist:  Dr. Allen Norris     Reason for Consultation:     Dysphasia        HPI:   Angela David is a 78 y.o. y/o female referred for consultation & management of dysphasia by Dr. Celesta Aver, Juanda Bond, MD.  This patient comes in today with a complicated past medical history including a colonoscopy back in 2015 with a large polyp removed by Dr. Jamal Collin without a repeat colonoscopy until February of this year by Dr. Bary Castilla with a reported 45 mm polyp seen.  The area was biopsied and tattooed.  It was recommended that the patient have the lesion removed.  The patient also had reflux symptoms and was seen by Dr. Alice Reichert at the end of last year.  At that time the patient states her symptoms went away so she did not follow-up with the recommended EGD they had suggested for her.  The patient now reports that she has been having problems with swallowing and it is mostly with water.  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 The patient has failed to have surgery to remove her large polyp as recommended by Dr. Bary Castilla.  The patient states that she is able to drink milkshakes but not water.  She also reports that she does not even try to eat anything bulky for fear of it getting stuck.  The patient has lost approximately 8 pounds in the last few weeks due to the dysphasia.  Past Medical History:  Diagnosis Date   Breast cancer ([place]) 2002   right breast chemo and radiation   Cancer ([place]) 2002   right breast   Diabetic peripheral neuropathy ([place]) 08/28/2016   GERD (gastroesophageal reflux disease)    Glaucoma    High cholesterol    History of colon polyps    Peripheral neuropathy 08/28/2016   Personal history of chemotherapy 2002   BREAST CA   Personal history of malignant neoplasm of breast     Personal history of radiation therapy 2002   BREAST CA   Sleep apnea 2011   Vocal cord paralysis 2012    Past Surgical History:  Procedure Laterality Date   ABDOMINAL HYSTERECTOMY     BREAST BIOPSY Right 2008   neg   BREAST EXCISIONAL BIOPSY Right 2002   positive   BREAST LUMPECTOMY Right 2002   chemo and radiation   BREAST SURGERY Right 2002   LUMPECTOMY, radiation, chemotherapy   CHOLECYSTECTOMY N/A 12/10/2017   Procedure: LAPAROSCOPIC CHOLECYSTECTOMY;  Surgeon: Benjamine Sprague, DO;  Location: [facility] ORS;  Service: General;  Laterality: N/A;   COLONOSCOPY  2009,12/30/2013   COLONOSCOPY WITH PROPOFOL N/A 04/16/2018   Tubulovillous polyp proximal transverse colon, large sessile polyp, proximal transverse colon;  Surgeon: Robert Bellow, MD;  Location: [facility] ENDOSCOPY;  Service: Endoscopy;  Laterality: N/A;   HYDROCELE EXCISION / REPAIR     TRACHEAL SURGERY  2013   TRACHEOSTOMY N/A    Dr Kathyrn Sheriff    Prior to Admission medications   Medication Sig Start Date End Date Taking? Authorizing Provider  acetaminophen (TYLENOL) 500 MG tablet Take 1,000 mg by mouth every 6 (six) hours as needed for moderate pain.    [provider]  Biotin 1000 MCG tablet Take 1,000 mcg by mouth daily.     [provider]  busPIRone (BUSPAR) 15 MG tablet Take 30 mg by mouth 2 (two) times daily.     [provider]  escitalopram (LEXAPRO) 10 MG tablet Take 10 mg by mouth daily.  01/13/15   [provider]  escitalopram (LEXAPRO) 20 MG tablet Take 20 mg by mouth daily.    [provider]  folic acid (FOLVITE) 1 MG tablet Take 1 mg by mouth daily.    [provider]  gabapentin (NEURONTIN) 600 MG tablet Take 600 mg by mouth 3 (three) times daily.     [provider]  Glucose Blood (COOL BLOOD GLUCOSE TEST STRIPS VI) CHECK GLUCOSE TWICE A DAY 07/28/18   [provider]  ibuprofen (ADVIL,MOTRIN) 800 MG tablet Take 1 tablet (800 mg  total) by mouth every 8 (eight) hours as needed for mild pain or moderate pain. 12/10/17   Lysle Pearl, Isami, DO  iron polysaccharides (NIFEREX) 150 MG capsule Take 150 mg by mouth daily.    [provider]  lovastatin (MEVACOR) 20 MG tablet Take 20 mg by mouth at bedtime.    [provider]  meclizine (ANTIVERT) 25 MG tablet Take 25 mg by mouth 2 (two) times daily as needed for dizziness.     [provider]  Melatonin 5 MG CAPS Take 5 mg by mouth at bedtime.     [provider]  metFORMIN (GLUCOPHAGE) 1000 MG tablet Take 1,000 mg by mouth 2 (two) times daily as needed (for BS >110 in the morning and >150 in the evening).     [provider]  metroNIDAZOLE (FLAGYL) 500 MG tablet Take 1 tablet at 6 pm and take 1 tablet at 11 pm the evening prior to surgery. 05/08/18   Robert Bellow, MD  neomycin (MYCIFRADIN) 500 MG tablet Take 2 tablets at 6 pm, then take 2 tablets at 11 pm the evening prior to surgery. 05/08/18   Robert Bellow, MD  pantoprazole (PROTONIX) 40 MG tablet Take 40 mg by mouth daily.     [provider]  polyethylene glycol powder (GLYCOLAX/MIRALAX) powder Mix whole container with 64 ounces of clear liquids, No Red liquids. Patient not taking: Reported on 10/08/2018 05/08/18   Robert Bellow, MD  RESTASIS 0.05 % ophthalmic emulsion Place 1 drop into both eyes 2 (two) times daily.     [provider]  rOPINIRole (REQUIP) 0.5 MG tablet Take 0.5 mg by mouth at bedtime.  06/09/18   [provider]    Family History  Problem Relation Age of Onset   Cancer Father        bone cancer   Cancer Sister        lymphoma   Cancer Brother    Breast cancer Neg Hx      Social History   Tobacco Use   Smoking status: Never Smoker   Smokeless tobacco: Never Used  Substance Use Topics   Alcohol use: No   Drug use: No    Allergies as of 10/08/2018 - Review Complete 10/08/2018  Allergen Reaction Noted    Shellfish allergy Shortness Of Breath 07/25/2012   Sodium sulfite Shortness Of Breath 12/14/2013   Codeine Other (See Comments) 11/26/2012    Review of Systems:    All systems reviewed and negative except where noted in HPI.   Physical Exam:  There were no vitals taken for this visit. No LMP recorded. Patient has had a hysterectomy. General:   Alert,  Well-developed, well-nourished, pleasant and cooperative in NAD  Head:  Normocephalic and atraumatic. Eyes:  Sclera clear, no icterus.   Conjunctiva pink. Ears:  Normal auditory acuity. Nose:  No deformity, discharge, or lesions. Mouth:  No deformity or lesions,oropharynx pink & moist. Neck:  Supple; no masses or thyromegaly tracheostomy in place Lungs:  Respirations even and unlabored.  Clear throughout to auscultation.   No wheezes, crackles, or rhonchi. No acute distress. Heart:  Regular rate and rhythm; no murmurs, clicks, rubs, or gallops. Abdomen:  Normal bowel sounds.  No bruits.  Soft, non-tender and non-distended without masses, hepatosplenomegaly or hernias noted.  No guarding or rebound tenderness.  Negative Carnett sign.   Rectal:  Deferred.  Msk:  Symmetrical without gross deformities.  Good, equal movement & strength bilaterally. Pulses:  Normal pulses noted. Extremities:  No clubbing or edema.  No cyanosis. Neurologic:  Alert and oriented x3;  grossly normal neurologically. Skin:  Intact without significant lesions or rashes.  No jaundice. Lymph Nodes:  No significant cervical adenopathy. Psych:  Alert and cooperative. Normal mood and affect.  Imaging Studies: Nm Pet Image Restag (ps) Skull Base To Thigh  Result Date: 09/16/2018 CLINICAL DATA:  Subsequent treatment strategy for breast carcinoma. Persistent elevation of CEA level. EXAM: NUCLEAR MEDICINE PET SKULL BASE TO THIGH TECHNIQUE: 8.0 mCi F-18 FDG was injected intravenously. Full-ring PET imaging was performed from the skull base to thigh after the radiotracer. CT  data was obtained and used for attenuation correction and anatomic localization. Fasting blood glucose: 140 mg/dl COMPARISON:  12/26/2010 FINDINGS: Mediastinal blood-pool activity (background): SUV max = 2.7 Liver activity (reference): SUV max = N/A NECK:  No hypermetabolic lymph nodes or masses. Incidental CT findings:  None. CHEST: No hypermetabolic masses or lymphadenopathy. No hypermetabolic pulmonary nodules or masses. Sub-cm bilateral pulmonary nodules remain stable, consistent with benign postinflammatory etiology. Right upper lobe scarring is stable. There has been resolution of left pleural effusion since prior exam. Tracheostomy tube noted in appropriate position. Incidental CT findings:  None. ABDOMEN/PELVIS: No abnormal hypermetabolic activity within the liver, pancreas, adrenal glands, or spleen. No hypermetabolic lymph nodes in the abdomen or pelvis. Incidental CT findings:  Prior cholecystectomy and hysterectomy. SKELETON: No focal hypermetabolic bone lesions to suggest skeletal metastasis. Incidental CT findings:  None. IMPRESSION: No evidence of metabolically active malignancy or metastatic disease. Electronically Signed   By: Marlaine Hind M.D.   On: 09/16/2018 16:14    Assessment and Plan:   Angela David is a 78 y.o. y/o female who comes in today with a history of a tracheostomy reflux and now reports dysphasia.  The patient has lost weight with the dysphasia.  She has been told the importance of having her large polyp removed surgically and will be set up with Dr.Pabon.  The patient will also be set up for an EGD due to her dysphasia.  The patient has been explained the plan and agrees with it.  Lucilla Lame, MD. [facility] Regal    Note: This dictation was prepared with Dragon dictation along with smaller phrase technology. Any transcriptional errors that result from this process are unintentional.

## 2018-10-09 NOTE — Addendum Note (Signed)
Addended byGlennie Isle E on: 10/09/2018 09:55 AM   Modules accepted: Orders

## 2018-10-10 ENCOUNTER — Other Ambulatory Visit: Payer: Self-pay

## 2018-10-10 ENCOUNTER — Other Ambulatory Visit
Admission: RE | Admit: 2018-10-10 | Discharge: 2018-10-10 | Disposition: A | Discharge status: Home / Self Care | Payer: Medicare Other | Source: Ambulatory Visit | Attending: Gastroenterology | Admitting: Gastroenterology

## 2018-10-10 DIAGNOSIS — Z01812 Encounter for preprocedural laboratory examination: Secondary | ICD-10-CM | POA: Diagnosis present

## 2018-10-10 DIAGNOSIS — Z20828 Contact with and (suspected) exposure to other viral communicable diseases: Secondary | ICD-10-CM | POA: Diagnosis not present

## 2018-10-10 NOTE — Telephone Encounter (Signed)
Notified patient as instructed, patient agrees.

## 2018-10-11 LAB — SARS CORONAVIRUS 2 (TAT 6-24 HRS): SARS Coronavirus 2: NEGATIVE

## 2018-10-14 ENCOUNTER — Encounter: Payer: Self-pay | Admitting: Anesthesiology

## 2018-10-14 ENCOUNTER — Encounter
Admission: RE | Disposition: A | Discharge status: Home / Self Care | Payer: Self-pay | Source: Home / Self Care | Attending: Gastroenterology

## 2018-10-14 ENCOUNTER — Ambulatory Visit
Admission: RE | Admit: 2018-10-14 | Discharge: 2018-10-14 | Disposition: A | Discharge status: Home / Self Care | Payer: Medicare Other | Attending: Gastroenterology | Admitting: Gastroenterology

## 2018-10-14 ENCOUNTER — Ambulatory Visit: Payer: Medicare Other | Admitting: Anesthesiology

## 2018-10-14 DIAGNOSIS — Z888 Allergy status to other drugs, medicaments and biological substances status: Secondary | ICD-10-CM | POA: Diagnosis not present

## 2018-10-14 DIAGNOSIS — R1310 Dysphagia, unspecified: Secondary | ICD-10-CM

## 2018-10-14 DIAGNOSIS — J385 Laryngeal spasm: Secondary | ICD-10-CM | POA: Diagnosis not present

## 2018-10-14 DIAGNOSIS — Z7984 Long term (current) use of oral hypoglycemic drugs: Secondary | ICD-10-CM | POA: Diagnosis not present

## 2018-10-14 DIAGNOSIS — G709 Myoneural disorder, unspecified: Secondary | ICD-10-CM | POA: Insufficient documentation

## 2018-10-14 DIAGNOSIS — Z923 Personal history of irradiation: Secondary | ICD-10-CM | POA: Insufficient documentation

## 2018-10-14 DIAGNOSIS — E7800 Pure hypercholesterolemia, unspecified: Secondary | ICD-10-CM | POA: Diagnosis not present

## 2018-10-14 DIAGNOSIS — K317 Polyp of stomach and duodenum: Secondary | ICD-10-CM | POA: Diagnosis not present

## 2018-10-14 DIAGNOSIS — Z930 Tracheostomy status: Secondary | ICD-10-CM | POA: Insufficient documentation

## 2018-10-14 DIAGNOSIS — H409 Unspecified glaucoma: Secondary | ICD-10-CM | POA: Diagnosis not present

## 2018-10-14 DIAGNOSIS — F329 Major depressive disorder, single episode, unspecified: Secondary | ICD-10-CM | POA: Diagnosis not present

## 2018-10-14 DIAGNOSIS — Z853 Personal history of malignant neoplasm of breast: Secondary | ICD-10-CM | POA: Insufficient documentation

## 2018-10-14 DIAGNOSIS — Z9221 Personal history of antineoplastic chemotherapy: Secondary | ICD-10-CM | POA: Insufficient documentation

## 2018-10-14 DIAGNOSIS — K219 Gastro-esophageal reflux disease without esophagitis: Secondary | ICD-10-CM | POA: Insufficient documentation

## 2018-10-14 DIAGNOSIS — K222 Esophageal obstruction: Secondary | ICD-10-CM

## 2018-10-14 DIAGNOSIS — F419 Anxiety disorder, unspecified: Secondary | ICD-10-CM | POA: Diagnosis not present

## 2018-10-14 DIAGNOSIS — K449 Diaphragmatic hernia without obstruction or gangrene: Secondary | ICD-10-CM | POA: Diagnosis not present

## 2018-10-14 DIAGNOSIS — Z885 Allergy status to narcotic agent status: Secondary | ICD-10-CM | POA: Diagnosis not present

## 2018-10-14 DIAGNOSIS — E1142 Type 2 diabetes mellitus with diabetic polyneuropathy: Secondary | ICD-10-CM | POA: Diagnosis not present

## 2018-10-14 DIAGNOSIS — K2950 Unspecified chronic gastritis without bleeding: Secondary | ICD-10-CM | POA: Insufficient documentation

## 2018-10-14 DIAGNOSIS — Z79899 Other long term (current) drug therapy: Secondary | ICD-10-CM | POA: Diagnosis not present

## 2018-10-14 DIAGNOSIS — Z8719 Personal history of other diseases of the digestive system: Secondary | ICD-10-CM | POA: Diagnosis not present

## 2018-10-14 DIAGNOSIS — Z91013 Allergy to seafood: Secondary | ICD-10-CM | POA: Insufficient documentation

## 2018-10-14 DIAGNOSIS — R1319 Other dysphagia: Secondary | ICD-10-CM

## 2018-10-14 HISTORY — PX: ESOPHAGOGASTRODUODENOSCOPY (EGD) WITH PROPOFOL: SHX5813

## 2018-10-14 LAB — GLUCOSE, CAPILLARY: Glucose-Capillary: 129 mg/dL — ABNORMAL HIGH (ref 70–99)

## 2018-10-14 SURGERY — ESOPHAGOGASTRODUODENOSCOPY (EGD) WITH PROPOFOL
Anesthesia: General

## 2018-10-14 MED ORDER — PHENYLEPHRINE HCL (PRESSORS) 10 MG/ML IV SOLN
INTRAVENOUS | Status: DC | PRN
  Administered 2018-10-14: 200 ug via INTRAVENOUS

## 2018-10-14 MED ORDER — PROPOFOL 500 MG/50ML IV EMUL
INTRAVENOUS | Status: DC | PRN
  Administered 2018-10-14: 130 ug/kg/min via INTRAVENOUS

## 2018-10-14 MED ORDER — PROPOFOL 10 MG/ML IV BOLUS
INTRAVENOUS | Status: DC | PRN
  Administered 2018-10-14: 80 mg via INTRAVENOUS

## 2018-10-14 MED ORDER — LIDOCAINE HCL (CARDIAC) PF 100 MG/5ML IV SOSY
PREFILLED_SYRINGE | INTRAVENOUS | Status: DC | PRN
  Administered 2018-10-14: 50 mg via INTRAVENOUS

## 2018-10-14 MED ORDER — SODIUM CHLORIDE 0.9 % IV SOLN
INTRAVENOUS | Status: DC
  Administered 2018-10-14: 1000 mL via INTRAVENOUS

## 2018-10-14 MED ORDER — PROPOFOL 500 MG/50ML IV EMUL
INTRAVENOUS | Status: AC
  Filled 2018-10-14: qty 50

## 2018-10-14 MED ORDER — LIDOCAINE HCL (PF) 2 % IJ SOLN
INTRAMUSCULAR | Status: AC
  Filled 2018-10-14: qty 10

## 2018-10-14 MED ORDER — PHENYLEPHRINE HCL (PRESSORS) 10 MG/ML IV SOLN
INTRAVENOUS | Status: AC
  Filled 2018-10-14: qty 1

## 2018-10-14 NOTE — Anesthesia Preprocedure Evaluation (Signed)
Anesthesia Evaluation  Patient identified by MRN, date of birth, ID band Patient awake    Reviewed: Allergy & Precautions, NPO status , Patient's Chart, lab work & pertinent test results, reviewed documented beta blocker date and time   Airway Mallampati: II  TM Distance: >3 FB     Dental  (+) Chipped   Pulmonary           Cardiovascular      Neuro/Psych  Headaches, PSYCHIATRIC DISORDERS Anxiety Depression  Neuromuscular disease    GI/Hepatic GERD  ,  Endo/Other  diabetes, Type 2  Renal/GU      Musculoskeletal   Abdominal   Peds  Hematology   Anesthesia Other Findings   Reproductive/Obstetrics                             Anesthesia Physical Anesthesia Plan  ASA: III  Anesthesia Plan: General   Post-op Pain Management:    Induction: Intravenous  PONV Risk Score and Plan:   Airway Management Planned:   Additional Equipment:   Intra-op Plan:   Post-operative Plan:   Informed Consent: I have reviewed the patients History and Physical, chart, labs and discussed the procedure including the risks, benefits and alternatives for the proposed anesthesia with the patient or authorized representative who has indicated his/her understanding and acceptance.       Plan Discussed with: CRNA  Anesthesia Plan Comments:         Anesthesia Quick Evaluation

## 2018-10-14 NOTE — Transfer of Care (Signed)
Immediate Anesthesia Transfer of Care Note  Patient: Angela David  Procedure(s) Performed: ESOPHAGOGASTRODUODENOSCOPY (EGD) WITH PROPOFOL (N/A )  Patient Location: PACU and Endoscopy Unit  Anesthesia Type:General  Level of Consciousness: awake, alert , oriented and patient cooperative  Airway & Oxygen Therapy: Patient Spontanous Breathing and Patient connected to tracheostomy mask oxygen  Post-op Assessment: Report given to RN and Post -op Vital signs reviewed and stable  Post vital signs: Reviewed and stable  Last Vitals:  Vitals Value Taken Time  BP 109/97 10/14/18 1136  Temp 36.1 C 10/14/18 1136  Pulse 87 10/14/18 1139  Resp 26 10/14/18 1139  SpO2 98 % 10/14/18 1139  Vitals shown include unvalidated device data.  Last Pain:  Vitals:   10/14/18 1136  TempSrc: Tympanic  PainSc:          Complications: No apparent anesthesia complications

## 2018-10-14 NOTE — Anesthesia Postprocedure Evaluation (Signed)
Anesthesia Post Note  Patient: Angela David  Procedure(s) Performed: ESOPHAGOGASTRODUODENOSCOPY (EGD) WITH PROPOFOL (N/A )  Patient location during evaluation: Endoscopy Anesthesia Type: General Level of consciousness: awake and alert Pain management: pain level controlled Vital Signs Assessment: post-procedure vital signs reviewed and stable Respiratory status: spontaneous breathing, nonlabored ventilation, respiratory function stable and patient connected to nasal cannula oxygen Cardiovascular status: blood pressure returned to baseline and stable Postop Assessment: no apparent nausea or vomiting Anesthetic complications: no     Last Vitals:  Vitals:   10/14/18 1156 10/14/18 1206  BP: (!) 157/58 (!) 109/47  Pulse:    Resp:    Temp:    SpO2:      Last Pain:  Vitals:   10/14/18 1206  TempSrc:   PainSc: 0-No pain                 Treavor Blomquist S

## 2018-10-14 NOTE — Op Note (Signed)
[facility] Patient Name: Angela David Procedure Date: 10/14/2018 11:12 AM MRN: [id number] Account #: [id number] Date of Birth: 07/03/1940 Admit Type: Outpatient Age: 78 Room: [facility] ENDO ROOM 4 Gender: Female Note Status: Finalized Procedure:            Upper GI endoscopy Indications:          Dysphagia Providers:            Lucilla Lame MD, MD Medicines:            Propofol per Anesthesia Complications:        No immediate complications. Procedure:            Pre-Anesthesia Assessment:                       - Prior to the procedure, a History and Physical was                        performed, and patient medications and allergies were                        reviewed. The patient's tolerance of previous                        anesthesia was also reviewed. The risks and benefits of                        the procedure and the sedation options and risks were                        discussed with the patient. All questions were                        answered, and informed consent was obtained. Prior                        Anticoagulants: The patient has taken no previous                        anticoagulant or antiplatelet agents. ASA Grade                        Assessment: II - A patient with mild systemic disease.                        After reviewing the risks and benefits, the patient was                        deemed in satisfactory condition to undergo the                        procedure.                       After obtaining informed consent, the endoscope was                        passed under direct vision. Throughout the procedure,                        the patient's blood pressure, pulse, and  oxygen                        saturations were monitored continuously. The Endoscope                        was introduced through the mouth, and advanced to the                        second part of duodenum. The upper GI endoscopy was                  accomplished without difficulty. The patient tolerated                        the procedure well. Findings:      A small hiatal hernia was present.      One benign-appearing, intrinsic mild stenosis was found in the upper       third of the esophagus. The stenosis was traversed. A TTS dilator was       passed through the scope. Dilation with a 12-13.5-15 mm balloon dilator       was performed to 15 mm. The dilation site was examined following       endoscope reinsertion and showed mild mucosal disruption.      Multiple semi-sessile polyps with no bleeding and no stigmata of recent       bleeding were found in the gastric fundus.      Localized mild inflammation characterized by erythema was found in the       gastric antrum. Biopsies were taken with a cold forceps for histology.      The examined duodenum was normal. Impression:           - Small hiatal hernia.                       - Benign-appearing esophageal stenosis. Dilated.                       - Multiple gastric polyps.                       - Gastritis. Biopsied.                       - Normal examined duodenum. Recommendation:       - Discharge patient to home.                       - Resume previous diet.                       - Continue present medications.                       - Await pathology results. Procedure Code(s):    --- Professional ---                       [phone], Esophagogastroduodenoscopy, flexible, transoral;                        with transendoscopic balloon dilation of esophagus                        (  less than 30 mm diameter)                       43239, 59, Esophagogastroduodenoscopy, flexible,                        transoral; with biopsy, single or multiple Diagnosis Code(s):    --- Professional ---                       R13.10, Dysphagia, unspecified                       K22.2, Esophageal obstruction                       K29.70, Gastritis, unspecified, without bleeding CPT  copyright 2019 American Medical Association. All rights reserved. The codes documented in this report are preliminary and upon coder review may  be revised to meet current compliance requirements. Lucilla Lame MD, MD 10/14/2018 11:33:11 AM This report has been signed electronically. Number of Addenda: 0 Note Initiated On: 10/14/2018 11:12 AM Estimated Blood Loss: Estimated blood loss: none.      [facility]

## 2018-10-14 NOTE — Interval H&P Note (Signed)
History and Physical Interval Note:  10/14/2018 11:13 AM  Angela David  has presented today for surgery, with the diagnosis of Dysphagia R13.10.  The various methods of treatment have been discussed with the patient and family. After consideration of risks, benefits and other options for treatment, the patient has consented to  Procedure(s): ESOPHAGOGASTRODUODENOSCOPY (EGD) WITH PROPOFOL (N/A) as a surgical intervention.  The patient's history has been reviewed, patient examined, no change in status, stable for surgery.  I have reviewed the patient's chart and labs.  Questions were answered to the patient's satisfaction.     Angela David [employer]

## 2018-10-14 NOTE — Anesthesia Post-op Follow-up Note (Signed)
Anesthesia QCDR form completed.        

## 2018-10-15 LAB — SURGICAL PATHOLOGY

## 2018-10-17 ENCOUNTER — Ambulatory Visit: Payer: Medicare Other

## 2018-10-19 ENCOUNTER — Encounter: Payer: Self-pay | Admitting: Gastroenterology

## 2018-10-20 ENCOUNTER — Ambulatory Visit (INDEPENDENT_AMBULATORY_CARE_PROVIDER_SITE_OTHER): Payer: Medicare Other | Admitting: Surgery

## 2018-10-20 ENCOUNTER — Encounter: Payer: Self-pay | Admitting: Surgery

## 2018-10-20 ENCOUNTER — Other Ambulatory Visit: Payer: Self-pay

## 2018-10-20 ENCOUNTER — Telehealth: Payer: Self-pay | Admitting: Gastroenterology

## 2018-10-20 VITALS — BP 119/70 | HR 73 | Temp 96.8°F | Resp 16 | Ht 64.0 in | Wt 156.2 lb

## 2018-10-20 DIAGNOSIS — D123 Benign neoplasm of transverse colon: Secondary | ICD-10-CM

## 2018-10-20 DIAGNOSIS — R1310 Dysphagia, unspecified: Secondary | ICD-10-CM

## 2018-10-20 DIAGNOSIS — R1319 Other dysphagia: Secondary | ICD-10-CM

## 2018-10-20 DIAGNOSIS — K635 Polyp of colon: Secondary | ICD-10-CM

## 2018-10-20 MED ORDER — POLYETHYLENE GLYCOL 3350 17 GM/SCOOP PO POWD: ORAL | 0 refills | Status: DC

## 2018-10-20 MED ORDER — BISACODYL EC 5 MG PO TBEC: DELAYED_RELEASE_TABLET | ORAL | 0 refills | Status: DC

## 2018-10-20 MED ORDER — ERYTHROMYCIN BASE 500 MG PO TABS: ORAL_TABLET | ORAL | 0 refills | Status: DC

## 2018-10-20 MED ORDER — NEOMYCIN SULFATE 500 MG PO TABS: ORAL_TABLET | ORAL | 0 refills | Status: DC

## 2018-10-20 NOTE — Patient Instructions (Addendum)
  We will schedule your Laparoscopic Right Colectomy.

## 2018-10-20 NOTE — H&P (View-Only) (Signed)
Patient ID: Angela David, female   DOB: [birth year], 78 y.o.   MRN: [id number]  HPI Lizvet Tantillo is a 78 y.o. female with a history of idiopathic vocal call paralysis with a prior complicated hospitalization requiring ventilatory support.  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  She is able to swallow well and is able to speak.  She does not require any oxygen therapy.  Recently Dr. Bary Castilla performed colonoscopy showing multiple polyps 1 of them was unresectable within the transverse colon. 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  There is no evidence of dysplasia.   I have personally reviewed the colonoscopy and there is a tattoo.   There is also a recent PET/CT that the patient had that have also personally reviewed showing no evidence of any metastatic disease.  She is able to perform more than 4 mets of activity without any shortness of breath or chest pain. Her recent labs show a creatinine of 1.1 with a CBC that was normal with a slight elevation of the white count. Denies any fevers and chills no weight loss and no evidence of melena or hematochezia. SHe does have a previous surgical history consistent with hysterectomy    HPI  Past Medical History:  Diagnosis Date  . Breast cancer ([place]) 2002   right breast chemo and radiation  . Cancer [facility]) 2002   right breast  . Diabetic peripheral neuropathy ([place]) 08/28/2016  . GERD (gastroesophageal reflux disease)   . Glaucoma   . High cholesterol   . History of colon polyps   . Peripheral neuropathy 08/28/2016  . Personal history of chemotherapy 2002   BREAST CA  . Personal history of malignant neoplasm of breast   . Personal history of radiation therapy 2002   BREAST CA  . Vocal cord paralysis 2012    Past Surgical History:  Procedure Laterality Date  . ABDOMINAL HYSTERECTOMY    . BREAST BIOPSY Right 2008   neg  . BREAST EXCISIONAL BIOPSY Right  2002   positive  . BREAST LUMPECTOMY Right 2002   chemo and radiation  . BREAST SURGERY Right 2002   LUMPECTOMY, radiation, chemotherapy  . CHOLECYSTECTOMY N/A 12/10/2017   Procedure: LAPAROSCOPIC CHOLECYSTECTOMY;  Surgeon: Benjamine Sprague, DO;  Location: [facility] ORS;  Service: General;  Laterality: N/A;  . COLONOSCOPY  2009,12/30/2013  . COLONOSCOPY WITH PROPOFOL N/A 04/16/2018   Tubulovillous polyp proximal transverse colon, large sessile polyp, proximal transverse colon;  Surgeon: Robert Bellow, MD;  Location: [facility] ENDOSCOPY;  Service: Endoscopy;  Laterality: N/A;  . ESOPHAGOGASTRODUODENOSCOPY (EGD) WITH PROPOFOL N/A 10/14/2018   Procedure: ESOPHAGOGASTRODUODENOSCOPY (EGD) WITH PROPOFOL;  Surgeon: Lucilla Lame, MD;  Location: [facility] ENDOSCOPY;  Service: Endoscopy;  Laterality: N/A;  . HYDROCELE EXCISION / REPAIR    . TRACHEAL SURGERY  2013  . TRACHEOSTOMY N/A    Dr Kathyrn Sheriff    Family History  Problem Relation Age of Onset  . Cancer Father        bone cancer  . Cancer Sister        lymphoma  . Cancer Brother   . Breast cancer Neg Hx     Social History Social History   Tobacco Use  . Smoking status: Never Smoker  . Smokeless tobacco: Never Used  Substance Use Topics  . Alcohol use: No  . Drug use: No    Allergies  Allergen Reactions  . Shellfish Allergy Shortness  Of Breath    respitatory distress   . Sodium Sulfite Shortness Of Breath    Severe Congestion that creates inability to breath  . Codeine Other (See Comments)    Per patient "it kept her up all night"    Current Outpatient Medications  Medication Sig Dispense Refill  . acetaminophen (TYLENOL) 500 MG tablet Take 1,000 mg by mouth every 6 (six) hours as needed for moderate pain.    . Biotin 1000 MCG tablet Take 1,000 mcg by mouth daily.     . busPIRone (BUSPAR) 15 MG tablet Take 30 mg by mouth 2 (two) times daily.     Marland Kitchen escitalopram (LEXAPRO) 10 MG tablet Take 10 mg by mouth daily.     Marland Kitchen escitalopram (LEXAPRO)  20 MG tablet Take 20 mg by mouth daily.    . folic acid (FOLVITE) 1 MG tablet Take 1 mg by mouth daily.    Marland Kitchen gabapentin (NEURONTIN) 600 MG tablet Take 600 mg by mouth 3 (three) times daily.     . Glucose Blood (COOL BLOOD GLUCOSE TEST STRIPS VI) CHECK GLUCOSE TWICE A DAY    . iron polysaccharides (NIFEREX) 150 MG capsule Take 150 mg by mouth daily.    Marland Kitchen lovastatin (MEVACOR) 20 MG tablet Take 20 mg by mouth at bedtime.    . meclizine (ANTIVERT) 25 MG tablet Take 25 mg by mouth 2 (two) times daily as needed for dizziness.     . Melatonin 5 MG CAPS Take 5 mg by mouth at bedtime.     . metFORMIN (GLUCOPHAGE) 1000 MG tablet Take 1,000 mg by mouth 2 (two) times daily as needed (for BS >110 in the morning and >150 in the evening).     . pantoprazole (PROTONIX) 40 MG tablet Take 40 mg by mouth daily.     . polyethylene glycol powder (GLYCOLAX/MIRALAX) powder Mix whole container with 64 ounces of clear liquids, No Red liquids. 255 g 0  . RESTASIS 0.05 % ophthalmic emulsion Place 1 drop into both eyes 2 (two) times daily.     Marland Kitchen rOPINIRole (REQUIP) 0.5 MG tablet Take 0.5 mg by mouth at bedtime.     Marland Kitchen ibuprofen (ADVIL,MOTRIN) 800 MG tablet Take 1 tablet (800 mg total) by mouth every 8 (eight) hours as needed for mild pain or moderate pain. (Patient not taking: Reported on 10/14/2018) 30 tablet 0  . metroNIDAZOLE (FLAGYL) 500 MG tablet Take 1 tablet at 6 pm and take 1 tablet at 11 pm the evening prior to surgery. (Patient not taking: Reported on 10/20/2018) 2 tablet 0  . neomycin (MYCIFRADIN) 500 MG tablet Take 2 tablets at 6 pm, then take 2 tablets at 11 pm the evening prior to surgery. (Patient not taking: Reported on 10/20/2018) 4 tablet 0   No current facility-administered medications for this visit.      Review of Systems Full ROS  was asked and was negative except for the information on the HPI  Physical Exam Blood pressure 119/70, pulse 73, temperature (!) 96.8 F (36 C), temperature source  Temporal, resp. rate 16, height 5\' 4"  (1.626 m), weight 156 lb 3.2 oz (70.9 kg), SpO2 96 %. CONSTITUTIONAL: NAD EYES: Pupils are equal, round, and reactive to light, Sclera are non-icteric. EARS, NOSE, MOUTH AND THROAT: The oropharynx is clear. The oral mucosa is pink and moist. Hearing is intact to voice. NECK: Trach in place without evidence of infection.  No masses, lymph nodes in the neck are normal. RESPIRATORY:  Lungs  are clear. There is normal respiratory effort, with equal breath sounds bilaterally, and without pathologic use of accessory muscles. CARDIOVASCULAR: Heart is regular without murmurs, gallops, or rubs. GI: The abdomen is  soft, nontender, and nondistended. There are no palpable masses. There is no hepatosplenomegaly. There are normal bowel sounds in all quadrants. GU: Rectal deferred.   MUSCULOSKELETAL: Normal muscle strength and tone. No cyanosis or edema.   SKIN: Turgor is good and there are no pathologic skin lesions or ulcers. NEUROLOGIC: Motor and sensation is grossly normal. Cranial nerves are grossly intact. PSYCH:  Oriented to person, place and time. Affect is normal.  Data Reviewed  I have personally reviewed the patient's imaging, laboratory findings and medical records.    Assessment/Plan Mrs. 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  Discussed with patient in detail about her disease process and I definitely recommend colectomy.  Depending on the location we may be able to do a right versus right extended hemicolectomy.  Procedure discussed with patient detail.  Risk benefits of possible completions including but not noted to: Bleeding, infection, anastomotic leak, the need for ostomy.  She understands and wishes to proceed. We will also make sure that she sees anesthesiology on a face-to-face encounter agreement the tracheostomy that she has. Time spent with the patient was 40 minutes, with more than 50% of  the time spent in face-to-face education, counseling and care coordination.     Caroleen Hamman, MD FACS General Surgeon 10/20/2018, 11:24 AM

## 2018-10-20 NOTE — Telephone Encounter (Signed)
This patient needs a barium swallow. Nothing seen on the EGD should be causing her problems

## 2018-10-20 NOTE — Progress Notes (Signed)
Patient ID: Angela David, female   DOB: Aug 25, 1940, 78 y.o.   MRN: [id number]  HPI [name] is a 78 y.o. female with a history of idiopathic vocal call paralysis with a prior complicated hospitalization requiring ventilatory support.  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  She is able to swallow well and is able to speak.  She does not require any oxygen therapy.  Recently Dr. Bary Castilla performed colonoscopy showing multiple polyps 1 of them was unresectable within the transverse colon. 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  There is no evidence of dysplasia.   I have personally reviewed the colonoscopy and there is a tattoo.   There is also a recent PET/CT that the patient had that have also personally reviewed showing no evidence of any metastatic disease.  She is able to perform more than 4 mets of activity without any shortness of breath or chest pain. Her recent labs show a creatinine of 1.1 with a CBC that was normal with a slight elevation of the white count. Denies any fevers and chills no weight loss and no evidence of melena or hematochezia. SHe does have a previous surgical history consistent with hysterectomy    HPI  Past Medical History:  Diagnosis Date  . Breast cancer ([place]) 2002   right breast chemo and radiation  . Cancer [facility]) 2002   right breast  . Diabetic peripheral neuropathy ([place]) 08/28/2016  . GERD (gastroesophageal reflux disease)   . Glaucoma   . High cholesterol   . History of colon polyps   . Peripheral neuropathy 08/28/2016  . Personal history of chemotherapy 2002   BREAST CA  . Personal history of malignant neoplasm of breast   . Personal history of radiation therapy 2002   BREAST CA  . Vocal cord paralysis 2012    Past Surgical History:  Procedure Laterality Date  . ABDOMINAL HYSTERECTOMY    . BREAST BIOPSY Right 2008   neg  . BREAST EXCISIONAL BIOPSY Right  2002   positive  . BREAST LUMPECTOMY Right 2002   chemo and radiation  . BREAST SURGERY Right 2002   LUMPECTOMY, radiation, chemotherapy  . CHOLECYSTECTOMY N/A 12/10/2017   Procedure: LAPAROSCOPIC CHOLECYSTECTOMY;  Surgeon: Benjamine Sprague, DO;  Location: [facility] ORS;  Service: General;  Laterality: N/A;  . COLONOSCOPY  2009,12/30/2013  . COLONOSCOPY WITH PROPOFOL N/A 04/16/2018   Tubulovillous polyp proximal transverse colon, large sessile polyp, proximal transverse colon;  Surgeon: Robert Bellow, MD;  Location: [facility] ENDOSCOPY;  Service: Endoscopy;  Laterality: N/A;  . ESOPHAGOGASTRODUODENOSCOPY (EGD) WITH PROPOFOL N/A 10/14/2018   Procedure: ESOPHAGOGASTRODUODENOSCOPY (EGD) WITH PROPOFOL;  Surgeon: Lucilla Lame, MD;  Location: [facility] ENDOSCOPY;  Service: Endoscopy;  Laterality: N/A;  . HYDROCELE EXCISION / REPAIR    . TRACHEAL SURGERY  2013  . TRACHEOSTOMY N/A    Dr Kathyrn Sheriff    Family History  Problem Relation Age of Onset  . Cancer Father        bone cancer  . Cancer Sister        lymphoma  . Cancer Brother   . Breast cancer Neg Hx     Social History Social History   Tobacco Use  . Smoking status: Never Smoker  . Smokeless tobacco: Never Used  Substance Use Topics  . Alcohol use: No  . Drug use: No    Allergies  Allergen Reactions  . Shellfish Allergy Shortness  Of Breath    respitatory distress   . Sodium Sulfite Shortness Of Breath    Severe Congestion that creates inability to breath  . Codeine Other (See Comments)    Per patient "it kept her up all night"    Current Outpatient Medications  Medication Sig Dispense Refill  . acetaminophen (TYLENOL) 500 MG tablet Take 1,000 mg by mouth every 6 (six) hours as needed for moderate pain.    . Biotin 1000 MCG tablet Take 1,000 mcg by mouth daily.     . busPIRone (BUSPAR) 15 MG tablet Take 30 mg by mouth 2 (two) times daily.     Marland Kitchen escitalopram (LEXAPRO) 10 MG tablet Take 10 mg by mouth daily.     Marland Kitchen escitalopram (LEXAPRO)  20 MG tablet Take 20 mg by mouth daily.    . folic acid (FOLVITE) 1 MG tablet Take 1 mg by mouth daily.    Marland Kitchen gabapentin (NEURONTIN) 600 MG tablet Take 600 mg by mouth 3 (three) times daily.     . Glucose Blood (COOL BLOOD GLUCOSE TEST STRIPS VI) CHECK GLUCOSE TWICE A DAY    . iron polysaccharides (NIFEREX) 150 MG capsule Take 150 mg by mouth daily.    Marland Kitchen lovastatin (MEVACOR) 20 MG tablet Take 20 mg by mouth at bedtime.    . meclizine (ANTIVERT) 25 MG tablet Take 25 mg by mouth 2 (two) times daily as needed for dizziness.     . Melatonin 5 MG CAPS Take 5 mg by mouth at bedtime.     . metFORMIN (GLUCOPHAGE) 1000 MG tablet Take 1,000 mg by mouth 2 (two) times daily as needed (for BS >110 in the morning and >150 in the evening).     . pantoprazole (PROTONIX) 40 MG tablet Take 40 mg by mouth daily.     . polyethylene glycol powder (GLYCOLAX/MIRALAX) powder Mix whole container with 64 ounces of clear liquids, No Red liquids. 255 g 0  . RESTASIS 0.05 % ophthalmic emulsion Place 1 drop into both eyes 2 (two) times daily.     Marland Kitchen rOPINIRole (REQUIP) 0.5 MG tablet Take 0.5 mg by mouth at bedtime.     Marland Kitchen ibuprofen (ADVIL,MOTRIN) 800 MG tablet Take 1 tablet (800 mg total) by mouth every 8 (eight) hours as needed for mild pain or moderate pain. (Patient not taking: Reported on 10/14/2018) 30 tablet 0  . metroNIDAZOLE (FLAGYL) 500 MG tablet Take 1 tablet at 6 pm and take 1 tablet at 11 pm the evening prior to surgery. (Patient not taking: Reported on 10/20/2018) 2 tablet 0  . neomycin (MYCIFRADIN) 500 MG tablet Take 2 tablets at 6 pm, then take 2 tablets at 11 pm the evening prior to surgery. (Patient not taking: Reported on 10/20/2018) 4 tablet 0   No current facility-administered medications for this visit.      Review of Systems Full ROS  was asked and was negative except for the information on the HPI  Physical Exam Blood pressure 119/70, pulse 73, temperature (!) 96.8 F (36 C), temperature source  Temporal, resp. rate 16, height 5\' 4"  (1.626 m), weight 156 lb 3.2 oz (70.9 kg), SpO2 96 %. CONSTITUTIONAL: NAD EYES: Pupils are equal, round, and reactive to light, Sclera are non-icteric. EARS, NOSE, MOUTH AND THROAT: The oropharynx is clear. The oral mucosa is pink and moist. Hearing is intact to voice. NECK: Trach in place without evidence of infection.  No masses, lymph nodes in the neck are normal. RESPIRATORY:  Lungs  are clear. There is normal respiratory effort, with equal breath sounds bilaterally, and without pathologic use of accessory muscles. CARDIOVASCULAR: Heart is regular without murmurs, gallops, or rubs. GI: The abdomen is  soft, nontender, and nondistended. There are no palpable masses. There is no hepatosplenomegaly. There are normal bowel sounds in all quadrants. GU: Rectal deferred.   MUSCULOSKELETAL: Normal muscle strength and tone. No cyanosis or edema.   SKIN: Turgor is good and there are no pathologic skin lesions or ulcers. NEUROLOGIC: Motor and sensation is grossly normal. Cranial nerves are grossly intact. PSYCH:  Oriented to person, place and time. Affect is normal.  Data Reviewed  I have personally reviewed the patient's imaging, laboratory findings and medical records.    Assessment/Plan Mrs. 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  Discussed with patient in detail about her disease process and I definitely recommend colectomy.  Depending on the location we may be able to do a right versus right extended hemicolectomy.  Procedure discussed with patient detail.  Risk benefits of possible completions including but not noted to: Bleeding, infection, anastomotic leak, the need for ostomy.  She understands and wishes to proceed. We will also make sure that she sees anesthesiology on a face-to-face encounter agreement the tracheostomy that she has. Time spent with the patient was 40 minutes, with more than 50% of  the time spent in face-to-face education, counseling and care coordination.     Caroleen Hamman, MD FACS General Surgeon 10/20/2018, 11:24 AM

## 2018-10-20 NOTE — Telephone Encounter (Signed)
Pt is calling she had a procedure done with Dr. Allen Norris and is still choking and having trouble I offered her apt for Wednesday but she has something to do that day please call pt

## 2018-10-20 NOTE — Telephone Encounter (Signed)
EGD was done by you on 10/14/18. Please advise.

## 2018-10-20 NOTE — Telephone Encounter (Signed)
Pt scheduled for a barium swallow at [facility] on Tuesday, Sept 1st at 9:30am. Pt has been advised to [facility] mall registration desk at 9:15am and to be NPO 3 hours prior.

## 2018-10-21 ENCOUNTER — Encounter: Payer: Self-pay | Admitting: Surgery

## 2018-10-22 ENCOUNTER — Telehealth: Payer: Self-pay | Admitting: *Deleted

## 2018-10-22 NOTE — Telephone Encounter (Signed)
Patient's surgery to be scheduled for 11-06-18 at [facility] with Dr. Dahlia Byes. Dr. Nestor Lewandowsky will be assisting with this case. Patient will require a formal bowel prep. Instructions were reviewed and provided to the patient. Per Dr. Dahlia Byes, patient to stop lovastatin 4 days prior to surgery due to interaction with erythromycin.   The patient is aware to have COVID-19 testing done on 11-04-18 at the [place] building drive thru ([id number] [place]) between 8:00 am and 10:30 am. She is aware to isolate after, have no visitors, wash hands frequently, and avoid touching face.   The patient is aware she will need to Pre-Admit on 10-30-18 at 1 pm. Patient will check in at the Trail entrance due to COVID-19 restrictions and will then be escorted to the [place], [address] (first floor).  Patient aware to be NPO after midnight and have a driver.   She is aware to check in at the [place] entrance where she will be screened for the coronavirus and then sent to Same Day Surgery.   Patient aware that she may have one visitor due to COVID-19 restrictions.   The patient verbalizes understanding of the above.   The patient is aware to call the office should she have further questions.

## 2018-10-24 ENCOUNTER — Telehealth: Payer: Self-pay | Admitting: *Deleted

## 2018-10-24 NOTE — Telephone Encounter (Signed)
Patient advised usually back to household activities with in 2 weeks. Mostly just lifting restrictions after that.

## 2018-10-24 NOTE — Telephone Encounter (Signed)
Patient called and is having surgery on 11/06/18 Right Colon Resection with Dr.Pabon and she wants to know her recovery time. Please call and advise

## 2018-10-28 ENCOUNTER — Other Ambulatory Visit: Payer: Self-pay | Admitting: Gastroenterology

## 2018-10-28 ENCOUNTER — Ambulatory Visit
Admission: RE | Admit: 2018-10-28 | Discharge: 2018-10-28 | Disposition: A | Discharge status: Home / Self Care | Payer: Medicare Other | Source: Ambulatory Visit | Attending: Gastroenterology | Admitting: Gastroenterology

## 2018-10-28 ENCOUNTER — Other Ambulatory Visit: Payer: Self-pay

## 2018-10-28 DIAGNOSIS — R1310 Dysphagia, unspecified: Secondary | ICD-10-CM | POA: Diagnosis not present

## 2018-10-28 DIAGNOSIS — R1319 Other dysphagia: Secondary | ICD-10-CM

## 2018-10-29 ENCOUNTER — Ambulatory Visit: Payer: Medicare Other | Admitting: Surgery

## 2018-10-30 ENCOUNTER — Other Ambulatory Visit: Payer: Medicare Other

## 2018-10-30 ENCOUNTER — Other Ambulatory Visit: Payer: Self-pay

## 2018-10-30 ENCOUNTER — Encounter
Admission: RE | Admit: 2018-10-30 | Discharge: 2018-10-30 | Disposition: A | Discharge status: Home / Self Care | Payer: Medicare Other | Source: Ambulatory Visit | Attending: Surgery | Admitting: Surgery

## 2018-10-30 DIAGNOSIS — Z86010 Personal history of colonic polyps: Secondary | ICD-10-CM | POA: Insufficient documentation

## 2018-10-30 DIAGNOSIS — Z01818 Encounter for other preprocedural examination: Secondary | ICD-10-CM | POA: Diagnosis not present

## 2018-10-30 DIAGNOSIS — E119 Type 2 diabetes mellitus without complications: Secondary | ICD-10-CM | POA: Insufficient documentation

## 2018-10-30 HISTORY — DX: Type 2 diabetes mellitus without complications: E11.9

## 2018-10-30 LAB — CBC
HCT: 42 % (ref 36.0–46.0)
Hemoglobin: 13.4 g/dL (ref 12.0–15.0)
MCH: 27.1 pg (ref 26.0–34.0)
MCHC: 31.9 g/dL (ref 30.0–36.0)
MCV: 84.8 fL (ref 80.0–100.0)
Platelets: 194 10*3/uL (ref 150–400)
RBC: 4.95 MIL/uL (ref 3.87–5.11)
RDW: 12.5 % (ref 11.5–15.5)
WBC: 9.4 10*3/uL (ref 4.0–10.5)
nRBC: 0 % (ref 0.0–0.2)

## 2018-10-30 LAB — TYPE AND SCREEN
ABO/RH(D): O NEG
Antibody Screen: NEGATIVE

## 2018-10-30 LAB — BASIC METABOLIC PANEL
Anion gap: 10 (ref 5–15)
BUN: 23 mg/dL (ref 8–23)
CO2: 24 mmol/L (ref 22–32)
Calcium: 9.8 mg/dL (ref 8.9–10.3)
Chloride: 105 mmol/L (ref 98–111)
Creatinine, Ser: 0.93 mg/dL (ref 0.44–1.00)
GFR calc Af Amer: >60 mL/min (ref >60)
GFR calc non Af Amer: >60 mL/min (ref >60)
Glucose, Bld: 96 mg/dL (ref 70–99)
Potassium: 4.3 mmol/L (ref 3.5–5.1)
Sodium: 139 mmol/L (ref 135–145)

## 2018-10-30 NOTE — Pre-Procedure Instructions (Signed)
Pt met with Dr. Kayleen Memos (anesthesiologist on call) for consult per Dr. Corlis Leak order.  Pt will call Dr. Corlis Leak office for clarification/discussion re: wording on consent form "possible ostomy."  Signature deferred until after she discusses with Dr. Dahlia Byes.

## 2018-10-30 NOTE — Patient Instructions (Signed)
Your COVID19 swab is scheduled on: Tuesday November 04, 2018 8-10:30am in front of [facility]  Your procedure is scheduled on: Thursday November 06, 2018 Report to Same Day Surgery 2nd floor [facility] Entrance-take elevator on left to 2nd floor.  Check in with surgery information desk.) To find out your arrival time, call [phone] 1:00-3:00 PM on Wednesday November 05, 2018  Remember: Instructions that are not followed completely may result in serious medical risk, up to and including death, or upon the discretion of your surgeon and anesthesiologist your surgery may need to be rescheduled.    __x__ 1. Do not eat food (including mints, candies, chewing gum) after midnight the night before your procedure. You may drink water up to 2 hours before you are scheduled to arrive at the hospital for your procedure.  Do not drink anything within 2 hours of your scheduled arrival to the hospital.    __x__ 2. No Alcohol for 24 hours before or after surgery.   __x__ 3. No Smoking or e-cigarettes for 24 hours before surgery.  Do not use any chewable tobacco products for at least 6 hours before surgery.   __x__ 4. Notify your doctor if there is any change in your medical condition (cold, fever, infections).   __x__ 5. On the morning of surgery brush your teeth with toothpaste and water.  You may rinse your mouth with mouthwash if you wish.  Do not swallow any toothpaste or mouthwash.  Please read over the following fact sheets that you were given:   [facility] Preparing for Surgery and/or MRSA Information    __x__ Use CHG Soap as directed on instruction sheet   Do not wear jewelry, make-up, hairpins, clips or nail polish on the day of surgery.  Do not wear lotions, powders, deodorant, or perfumes.   Do not shave below the face/neck 48 hours prior to surgery.   Do not bring valuables to the hospital.    [facility] is not responsible for any belongings or valuables.              Contacts, dentures or bridgework may not be worn into surgery.  Leave your suitcase in the car. After surgery it may be brought to your room.  For patients admitted to the hospital, discharge time is determined by your treatment team.  __x__ Take these medicines the morning of surgery:  1. NONE  Complete bowel prep as instructed by Dr. Corlis Leak office and take Protonix at 11pm the night before surgery.  __x__ Stop Metformin 2 days prior to surgery (Last dose November 03, 2018)  ____ Follow recommendations from Cardiologist, Pulmonologist or PCP regarding stopping Aspirin, Coumadin, Plavix, Eliquis, Effient, Pradaxa, and Pletal.  __x__ STARTING TODAY: Do not take any Anti-inflammatories such as Advil, Ibuprofen, Motrin, Aleve, Naproxen, Naprosyn, BC/Goodies powders or aspirin products. You may continue to take Tylenol and Celebrex.   __x__ STARTING TODAY: Do not take any over the counter supplements until after surgery. You may continue to take Vitamin B, and multivitamin.

## 2018-11-04 ENCOUNTER — Other Ambulatory Visit: Payer: Self-pay

## 2018-11-04 ENCOUNTER — Other Ambulatory Visit
Admission: RE | Admit: 2018-11-04 | Discharge: 2018-11-04 | Disposition: A | Discharge status: Home / Self Care | Payer: Medicare Other | Source: Ambulatory Visit | Attending: Surgery | Admitting: Surgery

## 2018-11-04 DIAGNOSIS — Z01812 Encounter for preprocedural laboratory examination: Secondary | ICD-10-CM | POA: Insufficient documentation

## 2018-11-04 DIAGNOSIS — K635 Polyp of colon: Secondary | ICD-10-CM | POA: Insufficient documentation

## 2018-11-04 DIAGNOSIS — Z20828 Contact with and (suspected) exposure to other viral communicable diseases: Secondary | ICD-10-CM | POA: Insufficient documentation

## 2018-11-04 LAB — SARS CORONAVIRUS 2 (TAT 6-24 HRS): SARS Coronavirus 2: NEGATIVE

## 2018-11-06 ENCOUNTER — Encounter
Admission: RE | Disposition: A | Discharge status: Home / Self Care | Payer: Self-pay | Source: Home / Self Care | Attending: Surgery

## 2018-11-06 ENCOUNTER — Inpatient Hospital Stay: Payer: Medicare Other | Admitting: Certified Registered"

## 2018-11-06 ENCOUNTER — Telehealth: Payer: Self-pay

## 2018-11-06 ENCOUNTER — Other Ambulatory Visit: Payer: Self-pay

## 2018-11-06 ENCOUNTER — Inpatient Hospital Stay
Admission: RE | Admit: 2018-11-06 | Discharge: 2018-11-09 | DRG: 331 | Disposition: A | Discharge status: Home / Self Care | Payer: Medicare Other | Attending: Surgery | Admitting: Surgery

## 2018-11-06 DIAGNOSIS — Z20828 Contact with and (suspected) exposure to other viral communicable diseases: Secondary | ICD-10-CM | POA: Diagnosis present

## 2018-11-06 DIAGNOSIS — Z807 Family history of other malignant neoplasms of lymphoid, hematopoietic and related tissues: Secondary | ICD-10-CM

## 2018-11-06 DIAGNOSIS — Z930 Tracheostomy status: Secondary | ICD-10-CM | POA: Diagnosis not present

## 2018-11-06 DIAGNOSIS — E7800 Pure hypercholesterolemia, unspecified: Secondary | ICD-10-CM | POA: Diagnosis present

## 2018-11-06 DIAGNOSIS — Z79899 Other long term (current) drug therapy: Secondary | ICD-10-CM

## 2018-11-06 DIAGNOSIS — E669 Obesity, unspecified: Secondary | ICD-10-CM | POA: Diagnosis present

## 2018-11-06 DIAGNOSIS — Z808 Family history of malignant neoplasm of other organs or systems: Secondary | ICD-10-CM

## 2018-11-06 DIAGNOSIS — Z90710 Acquired absence of both cervix and uterus: Secondary | ICD-10-CM | POA: Diagnosis not present

## 2018-11-06 DIAGNOSIS — Z885 Allergy status to narcotic agent status: Secondary | ICD-10-CM

## 2018-11-06 DIAGNOSIS — K635 Polyp of colon: Secondary | ICD-10-CM | POA: Diagnosis present

## 2018-11-06 DIAGNOSIS — E1142 Type 2 diabetes mellitus with diabetic polyneuropathy: Secondary | ICD-10-CM | POA: Diagnosis present

## 2018-11-06 DIAGNOSIS — Z923 Personal history of irradiation: Secondary | ICD-10-CM | POA: Diagnosis not present

## 2018-11-06 DIAGNOSIS — Z7984 Long term (current) use of oral hypoglycemic drugs: Secondary | ICD-10-CM | POA: Diagnosis not present

## 2018-11-06 DIAGNOSIS — D123 Benign neoplasm of transverse colon: Secondary | ICD-10-CM | POA: Diagnosis not present

## 2018-11-06 DIAGNOSIS — J3800 Paralysis of vocal cords and larynx, unspecified: Secondary | ICD-10-CM | POA: Diagnosis present

## 2018-11-06 DIAGNOSIS — Z888 Allergy status to other drugs, medicaments and biological substances status: Secondary | ICD-10-CM

## 2018-11-06 DIAGNOSIS — G4730 Sleep apnea, unspecified: Secondary | ICD-10-CM | POA: Diagnosis present

## 2018-11-06 DIAGNOSIS — H409 Unspecified glaucoma: Secondary | ICD-10-CM | POA: Diagnosis present

## 2018-11-06 DIAGNOSIS — Z91013 Allergy to seafood: Secondary | ICD-10-CM

## 2018-11-06 DIAGNOSIS — F329 Major depressive disorder, single episode, unspecified: Secondary | ICD-10-CM | POA: Diagnosis present

## 2018-11-06 DIAGNOSIS — Z9221 Personal history of antineoplastic chemotherapy: Secondary | ICD-10-CM | POA: Diagnosis not present

## 2018-11-06 DIAGNOSIS — K219 Gastro-esophageal reflux disease without esophagitis: Secondary | ICD-10-CM | POA: Diagnosis present

## 2018-11-06 DIAGNOSIS — Z853 Personal history of malignant neoplasm of breast: Secondary | ICD-10-CM

## 2018-11-06 DIAGNOSIS — F419 Anxiety disorder, unspecified: Secondary | ICD-10-CM | POA: Diagnosis present

## 2018-11-06 HISTORY — PX: COLON RESECTION: SHX5231

## 2018-11-06 LAB — GLUCOSE, CAPILLARY
Glucose-Capillary: 136 mg/dL — ABNORMAL HIGH (ref 70–99)
Glucose-Capillary: 174 mg/dL — ABNORMAL HIGH (ref 70–99)

## 2018-11-06 LAB — CBC
HCT: 40.1 % (ref 36.0–46.0)
Hemoglobin: 12.7 g/dL (ref 12.0–15.0)
MCH: 27.3 pg (ref 26.0–34.0)
MCHC: 31.7 g/dL (ref 30.0–36.0)
MCV: 86.1 fL (ref 80.0–100.0)
Platelets: 175 10*3/uL (ref 150–400)
RBC: 4.66 MIL/uL (ref 3.87–5.11)
RDW: 12.7 % (ref 11.5–15.5)
WBC: 14.1 10*3/uL — ABNORMAL HIGH (ref 4.0–10.5)
nRBC: 0 % (ref 0.0–0.2)

## 2018-11-06 LAB — CREATININE, SERUM
Creatinine, Ser: 1.01 mg/dL — ABNORMAL HIGH (ref 0.44–1.00)
GFR calc Af Amer: >60 mL/min (ref >60)
GFR calc non Af Amer: 54 mL/min — ABNORMAL LOW (ref >60)

## 2018-11-06 LAB — ABO/RH: ABO/RH(D): O NEG

## 2018-11-06 SURGERY — COLECTOMY, HAND-ASSISTED, LAPAROSCOPIC
Anesthesia: General | Laterality: Right

## 2018-11-06 MED ORDER — BUPIVACAINE LIPOSOME 1.3 % IJ SUSP
INTRAMUSCULAR | Status: AC
  Filled 2018-11-06: qty 20

## 2018-11-06 MED ORDER — KETOROLAC TROMETHAMINE 15 MG/ML IJ SOLN
15.0000 mg | Freq: Four times a day (QID) | INTRAMUSCULAR | Status: DC
  Administered 2018-11-06 – 2018-11-09 (×12): 15 mg via INTRAVENOUS
  Filled 2018-11-06 (×12): qty 1

## 2018-11-06 MED ORDER — BUSPIRONE HCL 15 MG PO TABS
30.0000 mg | ORAL_TABLET | Freq: Two times a day (BID) | ORAL | Status: DC
  Administered 2018-11-06 – 2018-11-09 (×7): 30 mg via ORAL
  Filled 2018-11-06 (×8): qty 2

## 2018-11-06 MED ORDER — ACETAMINOPHEN 10 MG/ML IV SOLN
INTRAVENOUS | Status: DC | PRN
  Administered 2018-11-06: 1000 mg via INTRAVENOUS

## 2018-11-06 MED ORDER — ESCITALOPRAM OXALATE 10 MG PO TABS
20.0000 mg | ORAL_TABLET | Freq: Every day | ORAL | Status: DC
  Administered 2018-11-06 – 2018-11-09 (×4): 20 mg via ORAL
  Filled 2018-11-06 (×4): qty 2

## 2018-11-06 MED ORDER — ONDANSETRON HCL 4 MG/2ML IJ SOLN
INTRAMUSCULAR | Status: DC | PRN
  Administered 2018-11-06: 4 mg via INTRAVENOUS

## 2018-11-06 MED ORDER — SODIUM CHLORIDE 0.9 % IV SOLN
INTRAVENOUS | Status: DC | PRN
  Administered 2018-11-06: 70 mL

## 2018-11-06 MED ORDER — CHLORHEXIDINE GLUCONATE CLOTH 2 % EX PADS: 6.0000 | MEDICATED_PAD | Freq: Once | CUTANEOUS | Status: DC

## 2018-11-06 MED ORDER — PROPOFOL 10 MG/ML IV BOLUS
INTRAVENOUS | Status: AC
  Filled 2018-11-06: qty 40

## 2018-11-06 MED ORDER — FENTANYL CITRATE (PF) 100 MCG/2ML IJ SOLN
25.0000 ug | INTRAMUSCULAR | Status: DC | PRN
  Administered 2018-11-06: 25 ug via INTRAVENOUS

## 2018-11-06 MED ORDER — SODIUM CHLORIDE (PF) 0.9 % IJ SOLN
INTRAMUSCULAR | Status: AC
  Filled 2018-11-06: qty 50

## 2018-11-06 MED ORDER — GABAPENTIN 300 MG PO CAPS: 300.0000 mg | ORAL_CAPSULE | ORAL | Status: DC

## 2018-11-06 MED ORDER — LACTATED RINGERS IV SOLN
INTRAVENOUS | Status: DC | PRN
  Administered 2018-11-06: 10:00:00 via INTRAVENOUS

## 2018-11-06 MED ORDER — SODIUM CHLORIDE 0.9 % IV SOLN
INTRAVENOUS | Status: DC
  Administered 2018-11-06: 07:00:00 via INTRAVENOUS

## 2018-11-06 MED ORDER — MORPHINE SULFATE (PF) 2 MG/ML IV SOLN: 2.0000 mg | INTRAVENOUS | Status: DC | PRN

## 2018-11-06 MED ORDER — OXYCODONE HCL 5 MG PO TABS
5.0000 mg | ORAL_TABLET | ORAL | Status: DC | PRN
  Administered 2018-11-06: 10 mg via ORAL
  Filled 2018-11-06: qty 2

## 2018-11-06 MED ORDER — ACETAMINOPHEN 500 MG PO TABS
1000.0000 mg | ORAL_TABLET | Freq: Four times a day (QID) | ORAL | Status: DC
  Administered 2018-11-06 – 2018-11-09 (×9): 1000 mg via ORAL
  Filled 2018-11-06 (×9): qty 2

## 2018-11-06 MED ORDER — ESCITALOPRAM OXALATE 10 MG PO TABS
10.0000 mg | ORAL_TABLET | Freq: Every day | ORAL | Status: DC
  Administered 2018-11-06 – 2018-11-09 (×4): 10 mg via ORAL
  Filled 2018-11-06 (×4): qty 1

## 2018-11-06 MED ORDER — FENTANYL CITRATE (PF) 250 MCG/5ML IJ SOLN
INTRAMUSCULAR | Status: AC
  Filled 2018-11-06: qty 5

## 2018-11-06 MED ORDER — FENTANYL CITRATE (PF) 100 MCG/2ML IJ SOLN
INTRAMUSCULAR | Status: AC
  Administered 2018-11-06: 12:00:00
  Filled 2018-11-06: qty 2

## 2018-11-06 MED ORDER — FENTANYL CITRATE (PF) 100 MCG/2ML IJ SOLN
INTRAMUSCULAR | Status: DC | PRN
  Administered 2018-11-06 (×5): 50 ug via INTRAVENOUS

## 2018-11-06 MED ORDER — ONDANSETRON HCL 4 MG/2ML IJ SOLN
INTRAMUSCULAR | Status: AC
  Administered 2018-11-06: 12:00:00
  Filled 2018-11-06: qty 2

## 2018-11-06 MED ORDER — DIPHENHYDRAMINE HCL 12.5 MG/5ML PO ELIX
12.5000 mg | ORAL_SOLUTION | Freq: Four times a day (QID) | ORAL | Status: DC | PRN
  Filled 2018-11-06: qty 5

## 2018-11-06 MED ORDER — SEVOFLURANE IN SOLN
RESPIRATORY_TRACT | Status: AC
  Filled 2018-11-06: qty 250

## 2018-11-06 MED ORDER — ACETAMINOPHEN 10 MG/ML IV SOLN
INTRAVENOUS | Status: AC
  Filled 2018-11-06: qty 100

## 2018-11-06 MED ORDER — ONDANSETRON HCL 4 MG/2ML IJ SOLN
INTRAMUSCULAR | Status: AC
  Filled 2018-11-06: qty 2

## 2018-11-06 MED ORDER — DIPHENHYDRAMINE HCL 50 MG/ML IJ SOLN: 12.5000 mg | Freq: Four times a day (QID) | INTRAMUSCULAR | Status: DC | PRN

## 2018-11-06 MED ORDER — SODIUM CHLORIDE 0.9 % IV SOLN
1.0000 g | INTRAVENOUS | Status: AC
  Administered 2018-11-06: 1 g via INTRAVENOUS
  Filled 2018-11-06: qty 1

## 2018-11-06 MED ORDER — ROCURONIUM BROMIDE 100 MG/10ML IV SOLN
INTRAVENOUS | Status: DC | PRN
  Administered 2018-11-06: 40 mg via INTRAVENOUS
  Administered 2018-11-06: 20 mg via INTRAVENOUS

## 2018-11-06 MED ORDER — PHENYLEPHRINE HCL (PRESSORS) 10 MG/ML IV SOLN
INTRAVENOUS | Status: AC
  Filled 2018-11-06: qty 1

## 2018-11-06 MED ORDER — MELATONIN 5 MG PO TABS
5.0000 mg | ORAL_TABLET | Freq: Every day | ORAL | Status: DC
  Administered 2018-11-06: 5 mg via ORAL
  Filled 2018-11-06 (×4): qty 1

## 2018-11-06 MED ORDER — METOPROLOL TARTRATE 5 MG/5ML IV SOLN
INTRAVENOUS | Status: DC | PRN
  Administered 2018-11-06 (×2): 2.5 mg via INTRAVENOUS

## 2018-11-06 MED ORDER — ONDANSETRON 4 MG PO TBDP: 4.0000 mg | ORAL_TABLET | Freq: Four times a day (QID) | ORAL | Status: DC | PRN

## 2018-11-06 MED ORDER — SODIUM CHLORIDE 0.9 % IV SOLN
INTRAVENOUS | Status: DC
  Administered 2018-11-06 – 2018-11-08 (×2): via INTRAVENOUS

## 2018-11-06 MED ORDER — LIDOCAINE HCL (CARDIAC) PF 100 MG/5ML IV SOSY
PREFILLED_SYRINGE | INTRAVENOUS | Status: DC | PRN
  Administered 2018-11-06: 60 mg via INTRAVENOUS

## 2018-11-06 MED ORDER — ROCURONIUM BROMIDE 50 MG/5ML IV SOLN
INTRAVENOUS | Status: AC
  Filled 2018-11-06: qty 2

## 2018-11-06 MED ORDER — BUPIVACAINE LIPOSOME 1.3 % IJ SUSP: 20.0000 mL | Freq: Once | INTRAMUSCULAR | Status: DC

## 2018-11-06 MED ORDER — SUGAMMADEX SODIUM 200 MG/2ML IV SOLN
INTRAVENOUS | Status: AC
  Filled 2018-11-06: qty 2

## 2018-11-06 MED ORDER — SUGAMMADEX SODIUM 200 MG/2ML IV SOLN
INTRAVENOUS | Status: DC | PRN
  Administered 2018-11-06: 150 mg via INTRAVENOUS

## 2018-11-06 MED ORDER — CELECOXIB 200 MG PO CAPS: 200.0000 mg | ORAL_CAPSULE | ORAL | Status: DC

## 2018-11-06 MED ORDER — PROPOFOL 10 MG/ML IV BOLUS
INTRAVENOUS | Status: DC | PRN
  Administered 2018-11-06: 80 mg via INTRAVENOUS

## 2018-11-06 MED ORDER — BUPIVACAINE-EPINEPHRINE (PF) 0.5% -1:200000 IJ SOLN
INTRAMUSCULAR | Status: AC
  Filled 2018-11-06: qty 30

## 2018-11-06 MED ORDER — BUPIVACAINE-EPINEPHRINE (PF) 0.5% -1:200000 IJ SOLN
INTRAMUSCULAR | Status: DC | PRN
  Administered 2018-11-06: 30 mL

## 2018-11-06 MED ORDER — ONDANSETRON HCL 4 MG/2ML IJ SOLN
4.0000 mg | Freq: Once | INTRAMUSCULAR | Status: AC | PRN
  Administered 2018-11-06: 4 mg via INTRAVENOUS

## 2018-11-06 MED ORDER — ENOXAPARIN SODIUM 40 MG/0.4ML ~~LOC~~ SOLN
40.0000 mg | SUBCUTANEOUS | Status: DC
  Administered 2018-11-07 – 2018-11-09 (×3): 40 mg via SUBCUTANEOUS
  Filled 2018-11-06 (×3): qty 0.4

## 2018-11-06 MED ORDER — ROPINIROLE HCL 1 MG PO TABS
0.5000 mg | ORAL_TABLET | Freq: Every day | ORAL | Status: DC
  Administered 2018-11-06 – 2018-11-08 (×3): 0.5 mg via ORAL
  Filled 2018-11-06 (×3): qty 1

## 2018-11-06 MED ORDER — GABAPENTIN 600 MG PO TABS
600.0000 mg | ORAL_TABLET | Freq: Three times a day (TID) | ORAL | Status: DC
  Administered 2018-11-06 – 2018-11-09 (×9): 600 mg via ORAL
  Filled 2018-11-06 (×9): qty 1

## 2018-11-06 MED ORDER — LIDOCAINE HCL (PF) 2 % IJ SOLN
INTRAMUSCULAR | Status: AC
  Filled 2018-11-06: qty 10

## 2018-11-06 MED ORDER — ONDANSETRON HCL 4 MG/2ML IJ SOLN: 4.0000 mg | Freq: Four times a day (QID) | INTRAMUSCULAR | Status: DC | PRN

## 2018-11-06 MED ORDER — ACETAMINOPHEN 500 MG PO TABS: 1000.0000 mg | ORAL_TABLET | ORAL | Status: DC

## 2018-11-06 SURGICAL SUPPLY — 68 items
"PENCIL ELECTRO HAND CTR " (MISCELLANEOUS) ×1 IMPLANT
BLADE SURG SZ10 CARB STEEL (BLADE) ×2 IMPLANT
CANISTER SUCT 1200ML W/VALVE (MISCELLANEOUS) ×2 IMPLANT
COVER WAND RF STERILE (DRAPES) ×2 IMPLANT
DECANTER SPIKE VIAL GLASS SM (MISCELLANEOUS) ×2 IMPLANT
DEFOGGER SCOPE WARMER CLEARIFY (MISCELLANEOUS) ×2 IMPLANT
DERMABOND ADVANCED (GAUZE/BANDAGES/DRESSINGS) ×1
DERMABOND ADVANCED .7 DNX12 (GAUZE/BANDAGES/DRESSINGS) ×2 IMPLANT
DRAPE INCISE IOBAN 66X45 STRL (DRAPES) ×2 IMPLANT
ELECT BLADE 6.5 EXT (BLADE) ×1 IMPLANT
ELECT CAUTERY BLADE 6.4 (BLADE) ×2 IMPLANT
ELECT REM PT RETURN 9FT ADLT (ELECTROSURGICAL) ×2
ELECTRODE REM PT RTRN 9FT ADLT (ELECTROSURGICAL) ×1 IMPLANT
GAUZE SPONGE 4X4 16PLY XRAY LF (GAUZE/BANDAGES/DRESSINGS) ×1 IMPLANT
GLOVE BIO SURGEON STRL SZ7 (GLOVE) ×6 IMPLANT
GOWN STRL REUS W/ TWL LRG LVL3 (GOWN DISPOSABLE) ×4 IMPLANT
GOWN STRL REUS W/TWL LRG LVL3 (GOWN DISPOSABLE) ×4
HANDLE SUCTION POOLE (INSTRUMENTS) ×1 IMPLANT
HANDLE YANKAUER SUCT BULB TIP (MISCELLANEOUS) ×2 IMPLANT
HOLDER FOLEY CATH W/STRAP (MISCELLANEOUS) ×2 IMPLANT
IRRIGATION STRYKERFLOW (MISCELLANEOUS) IMPLANT
IRRIGATOR STRYKERFLOW (MISCELLANEOUS) ×2
IV NS 1000ML (IV SOLUTION) ×1
IV NS 1000ML BAXH (IV SOLUTION) IMPLANT
NEEDLE HYPO 22GX1.5 SAFETY (NEEDLE) ×2 IMPLANT
NS IRRIG 1000ML POUR BTL (IV SOLUTION) ×2 IMPLANT
PACK COLON CLEAN CLOSURE (MISCELLANEOUS) ×2 IMPLANT
PACK LAP CHOLECYSTECTOMY (MISCELLANEOUS) ×2 IMPLANT
PENCIL ELECTRO HAND CTR (MISCELLANEOUS) ×2 IMPLANT
RELOAD PROXIMATE 75MM BLUE (ENDOMECHANICALS) ×4 IMPLANT
RELOAD STAPLE 60 2.6 WHT THN (STAPLE) ×2 IMPLANT
RELOAD STAPLE 60 3.6 BLU REG (STAPLE) ×2 IMPLANT
RELOAD STAPLE 75 3.8 BLU REG (ENDOMECHANICALS) IMPLANT
RELOAD STAPLER BLUE 60MM (STAPLE) ×2 IMPLANT
RELOAD STAPLER WHITE 60MM (STAPLE) ×2 IMPLANT
RETRACTOR WOUND ALXS 18CM MED (MISCELLANEOUS) IMPLANT
RTRCTR WOUND ALEXIS O 18CM MED (MISCELLANEOUS)
SHEARS HARMONIC ACE PLUS 36CM (ENDOMECHANICALS) ×2 IMPLANT
SLEEVE ENDOPATH XCEL 5M (ENDOMECHANICALS) IMPLANT
SPONGE KITTNER 5P (MISCELLANEOUS) ×1 IMPLANT
SPONGE LAP 18X18 RF (DISPOSABLE) ×5 IMPLANT
SPONGE LAP 18X36 RFD (DISPOSABLE) ×2 IMPLANT
STAPLE ECHEON FLEX 60 POW ENDO (STAPLE) ×2 IMPLANT
STAPLER PROXIMATE 75MM BLUE (STAPLE) ×1 IMPLANT
STAPLER RELOAD BLUE 60MM (STAPLE) ×4
STAPLER RELOAD WHITE 60MM (STAPLE) ×4
SUCTION POOLE HANDLE (INSTRUMENTS) ×2
SUT MNCRL 4-0 (SUTURE) ×2
SUT MNCRL 4-0 27XMFL (SUTURE) ×2
SUT PDS AB 0 CT1 27 (SUTURE) ×4 IMPLANT
SUT SILK 2 0 (SUTURE) ×2
SUT SILK 2 0 SH CR/8 (SUTURE) ×2 IMPLANT
SUT SILK 2 0SH CR/8 30 (SUTURE) ×1 IMPLANT
SUT SILK 2-0 18XBRD TIE 12 (SUTURE) IMPLANT
SUT SILK 2-0 30XBRD TIE 12 (SUTURE) ×1 IMPLANT
SUT VIC AB 3-0 SH 27 (SUTURE) ×1
SUT VIC AB 3-0 SH 27X BRD (SUTURE) ×1 IMPLANT
SUT VICRYL 0 AB UR-6 (SUTURE) ×4 IMPLANT
SUTURE MNCRL 4-0 27XMF (SUTURE) ×2 IMPLANT
SYR 30ML LL (SYRINGE) ×2 IMPLANT
SYS LAPSCP GELPORT 120MM (MISCELLANEOUS) ×2
SYSTEM LAPSCP GELPORT 120MM (MISCELLANEOUS) ×1 IMPLANT
TOWEL OR 17X26 4PK STRL BLUE (TOWEL DISPOSABLE) ×2 IMPLANT
TRAY FOLEY MTR SLVR 16FR STAT (SET/KITS/TRAYS/PACK) ×2 IMPLANT
TROCAR XCEL 12X100 BLDLESS (ENDOMECHANICALS) ×2 IMPLANT
TROCAR XCEL BLUNT TIP 100MML (ENDOMECHANICALS) ×2 IMPLANT
TROCAR XCEL NON-BLD 5MMX100MML (ENDOMECHANICALS) ×2 IMPLANT
TUBING EVAC SMOKE HEATED PNEUM (TUBING) ×2 IMPLANT

## 2018-11-06 NOTE — Op Note (Signed)
PROCEDURES: 1. Hand assisted Laparoscopic Right Hemicolectomy With stapled ileocolostomy  Pre-operative Diagnosis: Proximal Transverse colon unresectable polyp  Post-operative Diagnosis: Same  Surgeon: Marjory Lies Pabon   Assistants: Dr. Genevive Bi Required due to the complexity of the case: for exposure and creation of the anastomosis  Anesthesia: General endotracheal anesthesia  ASA Class: 2  Surgeon: Caroleen Hamman , MD FACS  Anesthesia: Gen. with endotracheal tube   Findings: Proximal T. Colon polyp, no evidence of distant metastasis. Polyp was at least 6 cms from resection margin. Tattoo transverse colon Tension free anastomosis, no evidence of intraop leak and good perfusion Significant adhesions from omentum and hepatic flexure to the liver from previous colecystectomy  Estimated Blood Loss: 50cc         Drains: none         Specimens: Right colon          Complications: none          Procedure Details  The patient was seen again in the Holding Room. The benefits, complications, treatment options, and expected outcomes were discussed with the patient. The risks of bleeding, infection, recurrence of symptoms, failure to resolve symptoms,  bowel injury, any of which could require further surgery were reviewed with the patient.   The patient was taken to Operating Room, identified as Angela David and the procedure verified.  A Time Out was held and the above information confirmed.  Prior to the induction of general anesthesia, antibiotic prophylaxis was administered. VTE prophylaxis was in place. General endotracheal anesthesia was then administered and tolerated well. After the induction, the abdomen was prepped with Chloraprep and draped in the sterile fashion. The patient was positioned in the supine position.  7 cm incision was created as a midline mini laparotomy. The abdominal cavity was entered under direct visualization and the GelPort device was placed. two 5 mm ports were  placed  under direct visualization and pneumoperitoneum was obtained.  The more dynamic changes were observed. The greater omentum was divided and the hepatic flexure was taken down using harmonic scalpel.  The white line of Toldt was incised and a lateral to medial dissection was performed.  We identified the right ureter as well as the duodenum and preserve both structures at all times. We Were also able to mobilize the attachments of the cecum and terminal ileum.  Once we had an adequate mobilization were able to remove the GelPort and exteriorized the right colon.  A 10 cm margin on the terminal ileum was identified and we created a window with electrocautery and divided the terminal ileum.  Attention then was turned to the distal excision margin.  We identified the middle colic artery on selected a spot right to the middle colic artery.  Were able to also use a 75 GIA stapler to divide this area.  The mesentery was scored with electrocautery.  We identified the right colic artery and suture ligated with 2 oh silks in the standard fashion.  The rest of the mesentery was divided using the harmonic scalpel.  Please note that we went as low as possible to the base of the mesentery to obtain adequate lymph nodes and adequate margins of dissection. Specimen was passed and sent to permanent pathology.  A standard side-to-side functional end to end staple anastomosis was created with  a 75 GIA stapler device.  We check for patency as well as leak.  There was a tension-free anastomosis with good perfusion and no evidence of intraoperative leak. We  changed gloves and place a clean closure tray.   Some of Marcaine was injected throughout the abdominal wall on both sides under direct visualization and palpation.  The fascia was closed with a running 0 PDS using the small bite techniques.  Incisions were closed with 4-0 Monocryl in a subcuticular fashion.  Dermabond was used to coat the skin.  Needle and laparotomy  counts were correct and there were no immediate complications     Caroleen Hamman, MD, FACS

## 2018-11-06 NOTE — Telephone Encounter (Signed)
LVM for pt to return my call.

## 2018-11-06 NOTE — Anesthesia Preprocedure Evaluation (Signed)
Anesthesia Evaluation  Patient identified by MRN, date of birth, ID band Patient awake    Reviewed: Allergy & Precautions, NPO status , Patient's Chart, lab work & pertinent test results  History of Anesthesia Complications Negative for: history of anesthetic complications  Airway Mallampati: III  TM Distance: >3 FB Neck ROM: Full    Dental no notable dental hx.    Pulmonary neg COPD,  Tracheostomy for vocal cord paralysis   breath sounds clear to auscultation- rhonchi (-) wheezing      Cardiovascular Exercise Tolerance: Good (-) hypertension(-) CAD, (-) Past MI, (-) Cardiac Stents and (-) CABG negative cardio ROS   Rhythm:Regular Rate:Normal - Systolic murmurs and - Diastolic murmurs    Neuro/Psych  Headaches, PSYCHIATRIC DISORDERS Anxiety Depression  Neuromuscular disease    GI/Hepatic Neg liver ROS, GERD  ,  Endo/Other  diabetes, Oral Hypoglycemic Agents  Renal/GU negative Renal ROS     Musculoskeletal negative musculoskeletal ROS (+)   Abdominal (+) - obese,   Peds  Hematology negative hematology ROS (+)   Anesthesia Other Findings Past Medical History: 2002: Breast cancer ([place])     Comment:  right breast chemo and radiation 2002: Cancer ([place])     Comment:  right breast 08/28/2016: Diabetic peripheral neuropathy ([place]) No date: GERD (gastroesophageal reflux disease) No date: Glaucoma No date: High cholesterol No date: History of colon polyps 08/28/2016: Peripheral neuropathy 2002: Personal history of chemotherapy     Comment:  BREAST CA No date: Personal history of malignant neoplasm of breast 2002: Personal history of radiation therapy     Comment:  BREAST CA 2011: Sleep apnea 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   Reproductive/Obstetrics                             Anesthesia Physical  Anesthesia Plan  ASA: III  Anesthesia Plan: General   Post-op Pain Management:    Induction:  Intravenous and Inhalational  PONV Risk Score and Plan: 2 and Ondansetron, Dexamethasone and Midazolam  Airway Management Planned: Tracheostomy  Additional Equipment:   Intra-op Plan:   Post-operative Plan: Extubation in OR  Informed Consent: I have reviewed the patients History and Physical, chart, labs and discussed the procedure including the risks, benefits and alternatives for the proposed anesthesia with the patient or authorized representative who has indicated his/her understanding and acceptance.     Dental advisory given  Plan Discussed with: CRNA and Anesthesiologist  Anesthesia Plan Comments: (Pt has uncuffed trach, reports that it is only ever changed by her ENT at [facility], will required cuffed trach to be able to provide PPV for laparoscopic surgery, pt has seen Dr. Tami Ribas in the past, discussed the case with him and he says it is ok to remove her trach tube and place a reinforced ETT for the surgery and then replace the trach tube after surgery, he did not have concerns about difficulty with removal/replacement. )        Anesthesia Quick Evaluation

## 2018-11-06 NOTE — Anesthesia Post-op Follow-up Note (Signed)
Anesthesia QCDR form completed.        

## 2018-11-06 NOTE — Interval H&P Note (Signed)
History and Physical Interval Note:  11/06/2018 7:22 AM  Angela David  has presented today for surgery, with the diagnosis of Z86.010 POLYP.  The various methods of treatment have been discussed with the patient and family. After consideration of risks, benefits and other options for treatment, the patient has consented to  Procedure(s): HAND ASSISTED LAPAROSCOPIC COLON RESECTION, RIGHT (Right) as a surgical intervention.  The patient's history has been reviewed, patient examined, no change in status, stable for surgery.  I have reviewed the patient's chart and labs.  Questions were answered to the patient's satisfaction.     [place]

## 2018-11-06 NOTE — Telephone Encounter (Signed)
-----   Message from Lucilla Lame, MD sent at 10/30/2018  1:46 PM EDT ----- Let the patient know that the barium swallow showed her esophagus to be wide open without any obstruction.  There does seem to be decreased motility therefore I am not pushing fluids down easily.  This would be best treated by her drinking water when she eats to change the food down.

## 2018-11-06 NOTE — Progress Notes (Signed)
RT assessed patient trache. Trach/ties found to be clean, dry, secured. All equipment at bedside. Patient on Room Air.

## 2018-11-06 NOTE — Transfer of Care (Signed)
Immediate Anesthesia Transfer of Care Note  Patient: Angela David  Procedure(s) Performed: HAND ASSISTED LAPAROSCOPIC COLON RESECTION, RIGHT (Right )  Patient Location: PACU  Anesthesia Type:General  Level of Consciousness: awake, alert  and oriented  Airway & Oxygen Therapy: Patient Spontanous Breathing  Post-op Assessment: Report given to RN and Post -op Vital signs reviewed and stable  Post vital signs: Reviewed and stable  Last Vitals:  Vitals Value Taken Time  BP    Temp    Pulse    Resp    SpO2      Last Pain:  Vitals:   11/06/18 0619  PainSc: 0-No pain         Complications: No apparent anesthesia complications

## 2018-11-06 NOTE — Anesthesia Procedure Notes (Signed)
Procedure Name: Intubation Date/Time: 11/06/2018 7:45 AM Performed by: Chanetta Marshall, CRNA Pre-anesthesia Checklist: Patient identified, Emergency Drugs available, Suction available and Patient being monitored Patient Re-evaluated:Patient Re-evaluated prior to induction Oxygen Delivery Method: Circle system utilized Preoxygenation: Pre-oxygenation with 100% oxygen Induction Type: IV induction Laryngoscope size: cuffed ETT inserted into trach stoma after induction of GA. Tube type: Oral Number of attempts: 1 Placement Confirmation: positive ETCO2,  breath sounds checked- equal and bilateral and CO2 detector Tube secured with: Tape Dental Injury: Teeth and Oropharynx as per pre-operative assessment

## 2018-11-07 ENCOUNTER — Encounter: Payer: Self-pay | Admitting: Surgery

## 2018-11-07 LAB — CBC
HCT: 37.3 % (ref 36.0–46.0)
Hemoglobin: 11.6 g/dL — ABNORMAL LOW (ref 12.0–15.0)
MCH: 27.3 pg (ref 26.0–34.0)
MCHC: 31.1 g/dL (ref 30.0–36.0)
MCV: 87.8 fL (ref 80.0–100.0)
Platelets: 164 10*3/uL (ref 150–400)
RBC: 4.25 MIL/uL (ref 3.87–5.11)
RDW: 12.9 % (ref 11.5–15.5)
WBC: 12.2 10*3/uL — ABNORMAL HIGH (ref 4.0–10.5)
nRBC: 0 % (ref 0.0–0.2)

## 2018-11-07 LAB — BASIC METABOLIC PANEL
Anion gap: 4 — ABNORMAL LOW (ref 5–15)
BUN: 18 mg/dL (ref 8–23)
CO2: 22 mmol/L (ref 22–32)
Calcium: 7.7 mg/dL — ABNORMAL LOW (ref 8.9–10.3)
Chloride: 115 mmol/L — ABNORMAL HIGH (ref 98–111)
Creatinine, Ser: 1.07 mg/dL — ABNORMAL HIGH (ref 0.44–1.00)
GFR calc Af Amer: 59 mL/min — ABNORMAL LOW (ref >60)
GFR calc non Af Amer: 51 mL/min — ABNORMAL LOW (ref >60)
Glucose, Bld: 173 mg/dL — ABNORMAL HIGH (ref 70–99)
Potassium: 4.1 mmol/L (ref 3.5–5.1)
Sodium: 141 mmol/L (ref 135–145)

## 2018-11-07 LAB — GLUCOSE, CAPILLARY
Glucose-Capillary: 139 mg/dL — ABNORMAL HIGH (ref 70–99)
Glucose-Capillary: 73 mg/dL (ref 70–99)
Glucose-Capillary: 104 mg/dL — ABNORMAL HIGH (ref 70–99)

## 2018-11-07 LAB — PHOSPHORUS: Phosphorus: 3.2 mg/dL (ref 2.5–4.6)

## 2018-11-07 LAB — MAGNESIUM: Magnesium: 1.9 mg/dL (ref 1.7–2.4)

## 2018-11-07 MED ORDER — INSULIN ASPART 100 UNIT/ML ~~LOC~~ SOLN: 0.0000 [IU] | Freq: Every day | SUBCUTANEOUS | Status: DC

## 2018-11-07 MED ORDER — LOPERAMIDE HCL 2 MG PO CAPS
2.0000 mg | ORAL_CAPSULE | Freq: Four times a day (QID) | ORAL | Status: DC
  Administered 2018-11-07 – 2018-11-08 (×4): 2 mg via ORAL
  Filled 2018-11-07 (×4): qty 1

## 2018-11-07 MED ORDER — INSULIN ASPART 100 UNIT/ML ~~LOC~~ SOLN
0.0000 [IU] | Freq: Three times a day (TID) | SUBCUTANEOUS | Status: DC
  Administered 2018-11-07 – 2018-11-08 (×3): 2 [IU] via SUBCUTANEOUS
  Filled 2018-11-07 (×4): qty 1

## 2018-11-07 MED ORDER — INSULIN ASPART 100 UNIT/ML ~~LOC~~ SOLN
4.0000 [IU] | Freq: Three times a day (TID) | SUBCUTANEOUS | Status: DC
  Administered 2018-11-07 – 2018-11-08 (×2): 4 [IU] via SUBCUTANEOUS
  Filled 2018-11-07 (×2): qty 1

## 2018-11-07 NOTE — Progress Notes (Signed)
[facility] Hospital Day(s): 1.   Post op day(s): 1 Day Post-Op.   Interval History: Patient seen and examined, no acute events or new complaints overnight. Patient reports that she is doing well other than being tired. She reports abdominal soreness but no fever, chills, nausea, or emesis. She has not been out of bed since surgery. She has tolerated liquid diet and has passed flatus. No other acute complaints.    Vital signs in last 24 hours: [min-max] current  Temp:  [98.1 F (36.7 C)-98.7 F (37.1 C)] 98.5 F (36.9 C) (09/11 0536) Pulse Rate:  [82-96] 94 (09/11 0536) Resp:  [11-19] 16 (09/11 0536) BP: (107-131)/(51-56) 107/52 (09/11 0536) SpO2:  [88 %-100 %] 90 % (09/11 0536)             Intake/Output last 2 shifts:  09/10 0701 - 09/11 0700 In: 1444.1 [P.O.:120; I.V.:1324.1] Out: 545 [Urine:495; Blood:50]   Physical Exam:  Constitutional: alert, cooperative and no distress  HEENT: Tracheostomy in place Respiratory: breathing non-labored at rest  Cardiovascular: regular rate and sinus rhythm  Gastrointestinal: soft, non-tender, and non-distended. No rebound/guarding Integumentary: Laparotomy and laparoscopic incisions are CDI with dermabond, no erythema or drainage.   Labs:  CBC Latest Ref Rng & Units 11/07/2018 11/06/2018 10/30/2018  WBC 4.0 - 10.5 K/uL 12.2(H) 14.1(H) 9.4  Hemoglobin 12.0 - 15.0 g/dL 11.6(L) 12.7 13.4  Hematocrit 36.0 - 46.0 % 37.3 40.1 42.0  Platelets 150 - 400 K/uL 164 175 194   CMP Latest Ref Rng & Units 11/07/2018 11/06/2018 10/30/2018  Glucose 70 - 99 mg/dL 173(H) - 96  BUN 8 - 23 mg/dL 18 - 23  Creatinine 0.44 - 1.00 mg/dL 1.07(H) 1.01(H) 0.93  Sodium 135 - 145 mmol/L 141 - 139  Potassium 3.5 - 5.1 mmol/L 4.1 - 4.3  Chloride 98 - 111 mmol/L 115(H) - 105  CO2 22 - 32 mmol/L 22 - 24  Calcium 8.9 - 10.3 mg/dL 7.7(L) - 9.8  Total Protein 6.5 - 8.1 g/dL - - -  Total Bilirubin 0.3 - 1.2 mg/dL - - -  Alkaline Phos  38 - 126 U/L - - -  AST 15 - 41 U/L - - -  ALT 0 - 44 U/L - - -     Imaging studies: No new pertinent imaging studies   Assessment/Plan: 78 y.o. female overall doing well with expected abdominal soreness and evidence of bowel function 1 Day Post-Op s/p laparoscopic right hemicolectomy for unresectable polyp of transverse colon   - continue CLD for now, IVF discontinued overnight due to concerns of respiratory congestion   - pain control prn (minimize narcotics); antiemetics prn  - Monitor abdominal examination; on-going bowel function  - discontinue foley catheter  - respiratory care for tracheostomy  - medical management of comorbidities  - mobilization encouraged; will engage PT   All of the above findings and recommendations were discussed with the patient, and the medical team, and all of patient's questions were answered to her expressed satisfaction.  -- Edison Simon, PA-C [facility] 11/07/2018, 8:10 AM [phone] M-F: 7am - 4pm \

## 2018-11-07 NOTE — Anesthesia Postprocedure Evaluation (Signed)
Anesthesia Post Note  Patient: Angela David  Procedure(s) Performed: HAND ASSISTED LAPAROSCOPIC COLON RESECTION, RIGHT (Right )  Patient location during evaluation: PACU Anesthesia Type: General Level of consciousness: awake and alert and oriented Pain management: pain level controlled Vital Signs Assessment: post-procedure vital signs reviewed and stable Respiratory status: spontaneous breathing Cardiovascular status: blood pressure returned to baseline Anesthetic complications: no     Last Vitals:  Vitals:   11/07/18 0536 11/07/18 1200  BP: (!) 107/52 (!) 111/58  Pulse: 94 96  Resp: 16 20  Temp: 36.9 C 37.1 C  SpO2: 90% 97%    Last Pain:  Vitals:   11/07/18 1200  TempSrc: Oral  PainSc:                  Jearldean Gutt

## 2018-11-07 NOTE — Progress Notes (Signed)
Dictation on: 11/07/2018 10:02 PM by: Jules Husbands [id number]

## 2018-11-07 NOTE — Evaluation (Signed)
Physical Therapy Evaluation Patient Details Name: Angela David MRN: [id number] DOB: October 03, 1940 Today's Date: 11/07/2018   History of Present Illness  Per MD H&P: is a 78 y.o. female with a PMH that includes breast CA, diabetic peripheral neuropathy, and idiopathic vocal call paralysis with a prior complicated hospitalization requiring ventilatory support.  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  She is able to swallow well and is able to speak.  She does not require any oxygen therapy.  Recently Dr. Bary Castilla performed colonoscopy showing multiple polyps 1 of them was unresectable within the transverse colon. 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  Pt is now s/p laparoscopic right hemicolectomy with stapled ileocolostomy.    Clinical Impression  Pt presented with deficits in strength, transfers, mobility, gait, balance, and activity tolerance.  Pt required min A with bed mobility tasks with log roll technique training provided.  Pt did not require physical assistance with sit to/from stand transfers but did lose balance posteriorly upon initial stand without a RW in front of her.  With the RW the pt was more stable and was able to amb 2 x 30' with slow, cautious cadence but no LOB and with SpO2 >/= 92% and HR WNL.  Pt reported no adverse symptoms during the session.  Pt will benefit from HHPT services upon discharge to safely address above deficits for decreased caregiver assistance and eventual return to PLOF.      Follow Up Recommendations Home health PT;Supervision for mobility/OOB    Equipment Recommendations  None recommended by PT    Recommendations for Other Services       Precautions / Restrictions Precautions Precautions: Fall Restrictions Weight Bearing Restrictions: No Other Position/Activity Restrictions: Abdominal incision(s)      Mobility  Bed Mobility Overal bed mobility: Needs  Assistance Bed Mobility: Rolling;Sidelying to Sit;Sit to Sidelying Rolling: Supervision Sidelying to sit: Min assist     Sit to sidelying: Min assist General bed mobility comments: Log roll training provided for decreased abdominal strain with bed mobility with pt requiring min A for BLEs in and out of bed  Transfers Overall transfer level: Needs assistance Equipment used: Rolling walker (2 wheeled);None Transfers: Sit to/from Stand           General transfer comment: Pt initially stood from the EOB without an AD and after standing <5 sec lost her balance and sat back down to the EOB; pt with increased stability and no LOB standing with a RW  Ambulation/Gait Ambulation/Gait assistance: Min guard Gait Distance (Feet): 30 Feet Assistive device: Rolling walker (2 wheeled) Gait Pattern/deviations: Step-through pattern;Decreased step length - right;Decreased step length - left Gait velocity: decreased   General Gait Details: Slow cadence with very short B step length with amb with a RW but steady without LOB; SpO2 remained >/= 92% throughout  Stairs            Wheelchair Mobility    Modified Rankin (Stroke Patients Only)       Balance Overall balance assessment: Needs assistance   Sitting balance-Leahy Scale: Good     Standing balance support: Bilateral upper extremity supported Standing balance-Leahy Scale: Fair                               Pertinent Vitals/Pain Pain Assessment: No/denies pain    Home Living Family/patient expects to be discharged to::  Private residence Living Arrangements: Spouse/significant other Available Help at Discharge: Family;Available 24 hours/day Type of Home: House Home Access: Ramped entrance     Home Layout: One level Home Equipment: Walker - 2 wheels;Wheelchair - manual      Prior Function Level of Independence: Independent with assistive device(s)         Comments: Ind amb without an AD HH or very  limited community distances, uses a w/c for longer distances; 3 falls in the last year all due to medication issue that has been resolved with no further falls, Ind with ADLs     Hand Dominance        Extremity/Trunk Assessment   Upper Extremity Assessment Upper Extremity Assessment: Generalized weakness    Lower Extremity Assessment Lower Extremity Assessment: Generalized weakness       Communication   Communication: Passy-Muir valve  Cognition Arousal/Alertness: Awake/alert Behavior During Therapy: WFL for tasks assessed/performed Overall Cognitive Status: Within Functional Limits for tasks assessed                                        General Comments      Exercises Total Joint Exercises Ankle Circles/Pumps: Strengthening;AROM;Both;10 reps;15 reps Quad Sets: Strengthening;Both;5 reps;10 reps Gluteal Sets: Strengthening;Both;5 reps;10 reps Heel Slides: AROM;Both;5 reps Long Arc Quad: AROM;Both;10 reps Knee Flexion: AROM;Both;10 reps Marching in Standing: AROM;Both;10 reps;Standing Other Exercises Other Exercises: Log roll bed mobility training Other Exercises: HEP education for BLE APs, QS, GS, and LAQs x 10 each 5-6x/day   Assessment/Plan    PT Assessment Patient needs continued PT services  PT Problem List Decreased strength;Decreased activity tolerance;Decreased balance;Decreased mobility;Decreased knowledge of use of DME       PT Treatment Interventions DME instruction;Gait training;Functional mobility training;Therapeutic activities;Therapeutic exercise;Balance training;Patient/family education    PT Goals (Current goals can be found in the Care Plan section)  Acute Rehab PT Goals Patient Stated Goal: To get stronger PT Goal Formulation: With patient Time For Goal Achievement: 11/20/18 Potential to Achieve Goals: Good    Frequency Min 2X/week   Barriers to discharge        Co-evaluation               AM-PAC PT "6  Clicks" Mobility  Outcome Measure Help needed turning from your back to your side while in a flat bed without using bedrails?: A Little Help needed moving from lying on your back to sitting on the side of a flat bed without using bedrails?: A Little Help needed moving to and from a bed to a chair (including a wheelchair)?: A Little Help needed standing up from a chair using your arms (e.g., wheelchair or bedside chair)?: A Little Help needed to walk in hospital room?: A Little Help needed climbing 3-5 steps with a railing? : A Little 6 Click Score: 18    End of Session Equipment Utilized During Treatment: Gait belt;Other (comment)(Gait belt under the arms superior to abd incision) Activity Tolerance: Patient tolerated treatment well Patient left: in chair;with call bell/phone within reach;with chair alarm set;with SCD's reapplied Nurse Communication: Mobility status PT Visit Diagnosis: Muscle weakness (generalized) (M62.81);Difficulty in walking, not elsewhere classified (R26.2);Unsteadiness on feet (R26.81)    Time: [id number] PT Time Calculation (min) (ACUTE ONLY): 52 min   Charges:   PT Evaluation $PT Eval Moderate Complexity: 1 Mod PT Treatments $Therapeutic Exercise: 8-22 mins $Therapeutic Activity: 8-22 mins  Linus Salmons PT, DPT 11/07/18, 5:50 PM

## 2018-11-08 LAB — GLUCOSE, CAPILLARY
Glucose-Capillary: 132 mg/dL — ABNORMAL HIGH (ref 70–99)
Glucose-Capillary: 140 mg/dL — ABNORMAL HIGH (ref 70–99)
Glucose-Capillary: 112 mg/dL — ABNORMAL HIGH (ref 70–99)
Glucose-Capillary: 125 mg/dL — ABNORMAL HIGH (ref 70–99)
Glucose-Capillary: 155 mg/dL — ABNORMAL HIGH (ref 70–99)

## 2018-11-08 LAB — HEMOGLOBIN A1C
Hgb A1c MFr Bld: 6.5 % — ABNORMAL HIGH (ref 4.8–5.6)
Mean Plasma Glucose: 140 mg/dL

## 2018-11-08 MED ORDER — CYCLOSPORINE 0.05 % OP EMUL
1.0000 [drp] | Freq: Two times a day (BID) | OPHTHALMIC | Status: DC
  Administered 2018-11-08 – 2018-11-09 (×3): 1 [drp] via OPHTHALMIC
  Filled 2018-11-08 (×4): qty 30

## 2018-11-08 MED ORDER — LOPERAMIDE HCL 2 MG PO CAPS: 2.0000 mg | ORAL_CAPSULE | ORAL | Status: DC | PRN

## 2018-11-08 MED ORDER — PANTOPRAZOLE SODIUM 40 MG PO TBEC
40.0000 mg | DELAYED_RELEASE_TABLET | Freq: Every day | ORAL | Status: DC
  Administered 2018-11-08 – 2018-11-09 (×2): 40 mg via ORAL
  Filled 2018-11-08 (×2): qty 1

## 2018-11-08 NOTE — Progress Notes (Signed)
POD # 2 Doing very well Diarrhea resolved after loperamide Taking PO Still not mobilizing as well  PE NAD Trach in place Abd: soft, minimal incisional tenderness, no peritonitis. Or infection Ext: no edema  A/P Doing well Prn imodium Mobilize DC in am

## 2018-11-09 LAB — GLUCOSE, CAPILLARY
Glucose-Capillary: 121 mg/dL — ABNORMAL HIGH (ref 70–99)
Glucose-Capillary: 130 mg/dL — ABNORMAL HIGH (ref 70–99)

## 2018-11-09 MED ORDER — HYDROCODONE-ACETAMINOPHEN 5-325 MG PO TABS: 1.0000 | ORAL_TABLET | Freq: Four times a day (QID) | ORAL | 0 refills | Status: DC | PRN

## 2018-11-09 MED ORDER — ONDANSETRON HCL 4 MG PO TABS: 4.0000 mg | ORAL_TABLET | Freq: Three times a day (TID) | ORAL | 0 refills | Status: DC | PRN

## 2018-11-09 MED ORDER — LOPERAMIDE HCL 2 MG PO TABS: 2.0000 mg | ORAL_TABLET | Freq: Four times a day (QID) | ORAL | 0 refills | Status: DC | PRN

## 2018-11-09 NOTE — Discharge Summary (Signed)
Patient ID: Angela David MRN: [id number] DOB/AGE: [birth year] 78 y.o.  Admit date: 11/06/2018 Discharge date: 11/09/2018   Discharge Diagnoses:  Active Problems:   Colon polyp   Procedures:lap right colectomy  Hospital Course:  78 yo w unresectable polpy on transver colon  was taken  to the operating room for an uneventful laparoscopic Right colectomy.  Patient was for 3 days. He diet was advanced slowly and she tolerated well. At The time of discharge the patient was ambulating,  pain was controlled.  Her vital signs were stable and she was afebrile.   physical exam at discharge showed a pt  in no acute distress.  Awake and alert.  Abdomen: Soft incisions healing well without infection or peritonitis.  Extremities well-perfused and no edema.  Condition of the patient the time of discharge was stable     Disposition: Discharge disposition: 01-Home or Self Care       Discharge Instructions    Call MD for:  difficulty breathing, headache or visual disturbances   Complete by: As directed    Call MD for:  extreme fatigue   Complete by: As directed    Call MD for:  hives   Complete by: As directed    Call MD for:  persistant dizziness or light-headedness   Complete by: As directed    Call MD for:  persistant nausea and vomiting   Complete by: As directed    Call MD for:  redness, tenderness, or signs of infection (pain, swelling, redness, odor or green/yellow discharge around incision site)   Complete by: As directed    Call MD for:  severe uncontrolled pain   Complete by: As directed    Call MD for:  temperature >100.4   Complete by: As directed    Diet - low sodium heart healthy   Complete by: As directed    Discharge instructions   Complete by: As directed    May shower starting today   Increase activity slowly   Complete by: As directed      Allergies as of 11/09/2018      Reactions   Shellfish Allergy Shortness Of Breath   respitatory distress    Sodium  Sulfite Shortness Of Breath   Severe Congestion that creates inability to breath   Codeine Other (See Comments)   Per patient "it kept her up all night"      Medication List    TAKE these medications   acetaminophen 500 MG tablet Commonly known as: TYLENOL Take 1,000 mg by mouth every 6 (six) hours as needed for moderate pain.   Biotin 1000 MCG tablet Take 1,000 mcg by mouth daily.   bisacodyl 5 MG EC tablet Generic drug: bisacodyl Take 4 tablets (5 mg each) at 8 am the day before surgery.   busPIRone 15 MG tablet Commonly known as: BUSPAR Take 30 mg by mouth 2 (two) times daily.   COOL BLOOD GLUCOSE TEST STRIPS VI CHECK GLUCOSE TWICE A DAY   erythromycin base 500 MG tablet Commonly known as: E-MYCIN Take two tablets at 8 am, two tablets at 2 pm, and two tablets at 8 pm the day before surgery.   escitalopram 20 MG tablet Commonly known as: LEXAPRO Take 20 mg by mouth daily.   escitalopram 10 MG tablet Commonly known as: LEXAPRO Take 10 mg by mouth daily.   folic acid 1 MG tablet Commonly known as: FOLVITE Take 1 mg by mouth daily.   gabapentin 600 MG tablet Commonly  known as: NEURONTIN Take 600 mg by mouth 3 (three) times daily.   HYDROcodone-acetaminophen 5-325 MG tablet Commonly known as: NORCO/VICODIN Take 1 tablet by mouth every 6 (six) hours as needed for moderate pain.   ibuprofen 800 MG tablet Commonly known as: ADVIL Take 1 tablet (800 mg total) by mouth every 8 (eight) hours as needed for mild pain or moderate pain.   iron polysaccharides 150 MG capsule Commonly known as: NIFEREX Take 150 mg by mouth daily.   loperamide 2 MG tablet Commonly known as: Imodium A-D Take 1 tablet (2 mg total) by mouth 4 (four) times daily as needed for diarrhea or loose stools.   lovastatin 20 MG tablet Commonly known as: MEVACOR Take 20 mg by mouth at bedtime.   meclizine 25 MG tablet Commonly known as: ANTIVERT Take 25 mg by mouth 2 (two) times daily as  needed for dizziness.   Melatonin 5 MG Caps Take 5 mg by mouth at bedtime.   metFORMIN 1000 MG tablet Commonly known as: GLUCOPHAGE Take 1,000 mg by mouth 2 (two) times daily as needed (for BS >110 in the morning and >150 in the evening).   neomycin 500 MG tablet Commonly known as: MYCIFRADIN Take two tablets at 8 am, two tablets at 2 pm, and two tablets at 8 pm the day before surgery.   ondansetron 4 MG tablet Commonly known as: Zofran Take 1 tablet (4 mg total) by mouth every 8 (eight) hours as needed for nausea or vomiting.   pantoprazole 40 MG tablet Commonly known as: PROTONIX Take 40 mg by mouth daily.   polyethylene glycol powder 17 GM/SCOOP powder Commonly known as: GLYCOLAX/MIRALAX 255 grams one bottle for bowel prep   Restasis 0.05 % ophthalmic emulsion Generic drug: cycloSPORINE Place 1 drop into both eyes 2 (two) times daily.   rOPINIRole 0.5 MG tablet Commonly known as: REQUIP Take 0.5 mg by mouth at bedtime.      Follow-up Information    Pabon, [place] F, MD Follow up in 2 week(s).   Specialty: General Surgery Contact information: [address] [place] [address] [phone]            Caroleen Hamman, MD FACS

## 2018-11-09 NOTE — TOC Transition Note (Signed)
Transition of Care [facility]) - CM/SW Discharge Note   Patient Details  Name: Angela David MRN: [id number] Date of Birth: 31-Oct-1940  Transition of Care [facility]) CM/SW Contact:  Beverly Sessions, RN Phone Number: 11/09/2018, 11:52 AM   Clinical Narrative:    Patient admitted from home.  Lives at home with husband  S/p hemicolectomy Patient to discharge home today.  Chronic trach. Patient states that she is independent of all trach care, and has all supplies needed.  PT has assessed patient and recommends home health PT  Patient politely declines, MD notified    Final next level of care: Home/Self Care Barriers to Discharge: Barriers Resolved   Patient Goals and CMS Choice        Discharge Placement                       Discharge Plan and Services                          HH Arranged: Patient Refused [facility]          Social Determinants of Health (SDOH) Interventions     Readmission Risk Interventions No flowsheet data found.

## 2018-11-10 ENCOUNTER — Telehealth: Payer: Self-pay | Admitting: Surgery

## 2018-11-10 LAB — SURGICAL PATHOLOGY

## 2018-11-10 NOTE — Telephone Encounter (Signed)
Patient has called and stated that she was not given a prescription for her pain medication when she was discharged. She also states that she called the pharmacy and they did not receive a request electronically.   Patient has surgery on 11/06/18 with Dr Hildred Alamin right hemicolectomy with ileocolostomy. Patient was discharged yesterday.   Please call patient and advise.

## 2018-11-10 NOTE — Telephone Encounter (Signed)
Per Dr Dahlia Byes the patient was given an paper prescription for her pain medication and it was not sent electronically. She will look through her paperwork from the hospital for this. She will call us back if she cannot locate the prescription.

## 2018-11-11 ENCOUNTER — Telehealth: Payer: Self-pay | Admitting: *Deleted

## 2018-11-11 NOTE — Telephone Encounter (Signed)
Patient don't want Occupational Therapist to come in her house. Advised patient if the come tell them no. If they call her she will let them know. They didn't leave a number with the  patient.

## 2018-11-11 NOTE — Telephone Encounter (Signed)
Patient called she had surgery on 11/06/18 Right Colon Resection by Dr.Pabon and stated that an Occupational therapist and a nurse came to her house last night to see her and she told them that she didn't need them to come and patient stated that they pushed their way in. Patient doesn't know where they came from and she doesn't want them to come back. Please call and advise

## 2018-11-12 ENCOUNTER — Telehealth: Payer: Self-pay | Admitting: *Deleted

## 2018-11-12 NOTE — Telephone Encounter (Signed)
Please let her know that there was no cancer on final pathology  Notified patient as instructed, patient pleased. Discussed follow-up appointments, patient agrees

## 2018-11-20 ENCOUNTER — Telehealth: Payer: Self-pay | Admitting: *Deleted

## 2018-11-20 NOTE — Telephone Encounter (Signed)
Telephone Triage Questions    Type of surgery:Lap Right Colon Resection   Date: 11/06/18                                    Physician: Dr.Pabon       Pain ? Where and how severe, (1-10) NO  Nausea, vomiting, Fever, chills? NO   Constipation / Diarrhea? NO    Last Bowel movement? Tuesday 11/18/18  Bright red blood when trying to have a bowel movement, but did not have a bowel movement. It was some in the toilet and more on the toilet paper Please call and advise

## 2018-11-20 NOTE — Telephone Encounter (Signed)
Spoke with patient at this time. The amount of blood was less than 1 teaspoon today. This was the first time she had seen any blood. Denies fever, chills, nausea, vomiting.  Bowel movements are normal and tends to have one every other day. No blood in stools.   Patient was instructed to monitor and if she starts bleeding worse or increased amounts to go to the emergency room otherwise we will see her at her regular scheduled appointment on 11/24/2018. Patient denies pain. She states everything is going so well.

## 2018-11-24 ENCOUNTER — Ambulatory Visit (INDEPENDENT_AMBULATORY_CARE_PROVIDER_SITE_OTHER): Payer: Medicare Other | Admitting: Surgery

## 2018-11-24 ENCOUNTER — Other Ambulatory Visit: Payer: Self-pay

## 2018-11-24 ENCOUNTER — Encounter: Payer: Self-pay | Admitting: Surgery

## 2018-11-24 VITALS — BP 115/63 | HR 80 | Temp 97.2°F | Resp 14 | Ht 64.0 in | Wt 154.8 lb

## 2018-11-24 DIAGNOSIS — Z09 Encounter for follow-up examination after completed treatment for conditions other than malignant neoplasm: Secondary | ICD-10-CM

## 2018-11-24 NOTE — Progress Notes (Signed)
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

## 2018-11-24 NOTE — Patient Instructions (Addendum)
Please call our office if you have questions or concerns.  Please be sure to follow up with Dr.Wohl for your Colonoscopy.

## 2018-12-16 ENCOUNTER — Other Ambulatory Visit: Payer: Self-pay

## 2018-12-16 DIAGNOSIS — Z1231 Encounter for screening mammogram for malignant neoplasm of breast: Secondary | ICD-10-CM

## 2018-12-25 ENCOUNTER — Emergency Department: Payer: Medicare Other

## 2018-12-25 ENCOUNTER — Other Ambulatory Visit: Payer: Self-pay

## 2018-12-25 ENCOUNTER — Encounter: Payer: Self-pay | Admitting: Emergency Medicine

## 2018-12-25 ENCOUNTER — Inpatient Hospital Stay
Admission: EM | Admit: 2018-12-25 | Discharge: 2018-12-31 | DRG: 200 | Disposition: A | Discharge status: Home Health | Payer: Medicare Other | Attending: Surgery | Admitting: Surgery

## 2018-12-25 DIAGNOSIS — Z885 Allergy status to narcotic agent status: Secondary | ICD-10-CM

## 2018-12-25 DIAGNOSIS — I700 Atherosclerosis of aorta: Secondary | ICD-10-CM | POA: Diagnosis present

## 2018-12-25 DIAGNOSIS — W19XXXA Unspecified fall, initial encounter: Secondary | ICD-10-CM | POA: Diagnosis not present

## 2018-12-25 DIAGNOSIS — E1142 Type 2 diabetes mellitus with diabetic polyneuropathy: Secondary | ICD-10-CM | POA: Diagnosis present

## 2018-12-25 DIAGNOSIS — Y92009 Unspecified place in unspecified non-institutional (private) residence as the place of occurrence of the external cause: Secondary | ICD-10-CM

## 2018-12-25 DIAGNOSIS — Z923 Personal history of irradiation: Secondary | ICD-10-CM

## 2018-12-25 DIAGNOSIS — Z930 Tracheostomy status: Secondary | ICD-10-CM

## 2018-12-25 DIAGNOSIS — Z86010 Personal history of colonic polyps: Secondary | ICD-10-CM

## 2018-12-25 DIAGNOSIS — K219 Gastro-esophageal reflux disease without esophagitis: Secondary | ICD-10-CM | POA: Diagnosis present

## 2018-12-25 DIAGNOSIS — Z9221 Personal history of antineoplastic chemotherapy: Secondary | ICD-10-CM

## 2018-12-25 DIAGNOSIS — Z91018 Allergy to other foods: Secondary | ICD-10-CM

## 2018-12-25 DIAGNOSIS — J9811 Atelectasis: Secondary | ICD-10-CM | POA: Diagnosis present

## 2018-12-25 DIAGNOSIS — S270XXA Traumatic pneumothorax, initial encounter: Principal | ICD-10-CM | POA: Diagnosis present

## 2018-12-25 DIAGNOSIS — F419 Anxiety disorder, unspecified: Secondary | ICD-10-CM | POA: Diagnosis present

## 2018-12-25 DIAGNOSIS — Z7984 Long term (current) use of oral hypoglycemic drugs: Secondary | ICD-10-CM

## 2018-12-25 DIAGNOSIS — Z79899 Other long term (current) drug therapy: Secondary | ICD-10-CM

## 2018-12-25 DIAGNOSIS — E7800 Pure hypercholesterolemia, unspecified: Secondary | ICD-10-CM | POA: Diagnosis present

## 2018-12-25 DIAGNOSIS — J939 Pneumothorax, unspecified: Secondary | ICD-10-CM | POA: Diagnosis not present

## 2018-12-25 DIAGNOSIS — Z853 Personal history of malignant neoplasm of breast: Secondary | ICD-10-CM

## 2018-12-25 DIAGNOSIS — Z888 Allergy status to other drugs, medicaments and biological substances status: Secondary | ICD-10-CM

## 2018-12-25 DIAGNOSIS — S2241XA Multiple fractures of ribs, right side, initial encounter for closed fracture: Secondary | ICD-10-CM | POA: Diagnosis present

## 2018-12-25 DIAGNOSIS — H409 Unspecified glaucoma: Secondary | ICD-10-CM | POA: Diagnosis present

## 2018-12-25 DIAGNOSIS — E785 Hyperlipidemia, unspecified: Secondary | ICD-10-CM | POA: Diagnosis present

## 2018-12-25 DIAGNOSIS — W01198A Fall on same level from slipping, tripping and stumbling with subsequent striking against other object, initial encounter: Secondary | ICD-10-CM | POA: Diagnosis present

## 2018-12-25 DIAGNOSIS — I2510 Atherosclerotic heart disease of native coronary artery without angina pectoris: Secondary | ICD-10-CM | POA: Diagnosis present

## 2018-12-25 DIAGNOSIS — Z20828 Contact with and (suspected) exposure to other viral communicable diseases: Secondary | ICD-10-CM | POA: Diagnosis present

## 2018-12-25 LAB — CBC WITH DIFFERENTIAL/PLATELET
Abs Immature Granulocytes: 0.08 10*3/uL — ABNORMAL HIGH (ref 0.00–0.07)
Basophils Absolute: 0 10*3/uL (ref 0.0–0.1)
Basophils Relative: 0 %
Eosinophils Absolute: 0.1 10*3/uL (ref 0.0–0.5)
Eosinophils Relative: 0 %
HCT: 40 % (ref 36.0–46.0)
Hemoglobin: 12.2 g/dL (ref 12.0–15.0)
Immature Granulocytes: 1 %
Lymphocytes Relative: 10 %
Lymphs Abs: 1.5 10*3/uL (ref 0.7–4.0)
MCH: 26 pg (ref 26.0–34.0)
MCHC: 30.5 g/dL (ref 30.0–36.0)
MCV: 85.1 fL (ref 80.0–100.0)
Monocytes Absolute: 0.5 10*3/uL (ref 0.1–1.0)
Monocytes Relative: 4 %
Neutro Abs: 12.3 10*3/uL — ABNORMAL HIGH (ref 1.7–7.7)
Neutrophils Relative %: 85 %
Platelets: 201 10*3/uL (ref 150–400)
RBC: 4.7 MIL/uL (ref 3.87–5.11)
RDW: 12.7 % (ref 11.5–15.5)
WBC: 14.5 10*3/uL — ABNORMAL HIGH (ref 4.0–10.5)
nRBC: 0 % (ref 0.0–0.2)

## 2018-12-25 LAB — SARS CORONAVIRUS 2 (TAT 6-24 HRS): SARS Coronavirus 2: NEGATIVE

## 2018-12-25 LAB — COMPREHENSIVE METABOLIC PANEL
ALT: 80 U/L — ABNORMAL HIGH (ref 0–44)
AST: 166 U/L — ABNORMAL HIGH (ref 15–41)
Albumin: 4 g/dL (ref 3.5–5.0)
Alkaline Phosphatase: 58 U/L (ref 38–126)
Anion gap: 14 (ref 5–15)
BUN: 18 mg/dL (ref 8–23)
CO2: 22 mmol/L (ref 22–32)
Calcium: 10.1 mg/dL (ref 8.9–10.3)
Chloride: 102 mmol/L (ref 98–111)
Creatinine, Ser: 0.97 mg/dL (ref 0.44–1.00)
GFR calc Af Amer: >60 mL/min (ref >60)
GFR calc non Af Amer: 57 mL/min — ABNORMAL LOW (ref >60)
Glucose, Bld: 143 mg/dL — ABNORMAL HIGH (ref 70–99)
Potassium: 4.9 mmol/L (ref 3.5–5.1)
Sodium: 138 mmol/L (ref 135–145)
Total Bilirubin: 0.9 mg/dL (ref 0.3–1.2)
Total Protein: 8 g/dL (ref 6.5–8.1)

## 2018-12-25 MED ORDER — KETOROLAC TROMETHAMINE 15 MG/ML IJ SOLN
15.0000 mg | Freq: Four times a day (QID) | INTRAMUSCULAR | Status: AC
  Administered 2018-12-25: 15 mg via INTRAVENOUS
  Filled 2018-12-25: qty 1

## 2018-12-25 MED ORDER — ONDANSETRON 4 MG PO TBDP
4.0000 mg | ORAL_TABLET | Freq: Four times a day (QID) | ORAL | Status: DC | PRN
  Administered 2018-12-25: 4 mg via ORAL
  Filled 2018-12-25 (×2): qty 1

## 2018-12-25 MED ORDER — ESCITALOPRAM OXALATE 10 MG PO TABS
20.0000 mg | ORAL_TABLET | Freq: Every day | ORAL | Status: DC
  Administered 2018-12-25 – 2018-12-31 (×7): 20 mg via ORAL
  Filled 2018-12-25 (×7): qty 2

## 2018-12-25 MED ORDER — KETOROLAC TROMETHAMINE 15 MG/ML IJ SOLN
15.0000 mg | Freq: Four times a day (QID) | INTRAMUSCULAR | Status: DC | PRN
  Administered 2018-12-28: 15 mg via INTRAVENOUS
  Filled 2018-12-25 (×2): qty 1

## 2018-12-25 MED ORDER — KETOROLAC TROMETHAMINE 30 MG/ML IJ SOLN
INTRAMUSCULAR | Status: AC
  Administered 2018-12-25: 19:00:00 15 mg
  Filled 2018-12-25: qty 1

## 2018-12-25 MED ORDER — ONDANSETRON 4 MG PO TBDP
4.0000 mg | ORAL_TABLET | Freq: Once | ORAL | Status: AC
  Administered 2018-12-25: 12:00:00 4 mg via ORAL
  Filled 2018-12-25: qty 1

## 2018-12-25 MED ORDER — HYDROMORPHONE HCL 1 MG/ML IJ SOLN
0.5000 mg | Freq: Once | INTRAMUSCULAR | Status: AC
  Administered 2018-12-25: 0.5 mg via INTRAVENOUS
  Filled 2018-12-25: qty 1

## 2018-12-25 MED ORDER — OXYCODONE HCL 5 MG PO TABS
5.0000 mg | ORAL_TABLET | ORAL | Status: DC | PRN
  Administered 2018-12-25 – 2018-12-26 (×3): 10 mg via ORAL
  Filled 2018-12-25 (×3): qty 2
  Filled 2018-12-25: qty 1

## 2018-12-25 MED ORDER — HYDROMORPHONE HCL 1 MG/ML IJ SOLN
0.5000 mg | INTRAMUSCULAR | Status: DC | PRN
  Administered 2018-12-26: 1 mg via INTRAVENOUS
  Filled 2018-12-25: qty 1

## 2018-12-25 MED ORDER — IOHEXOL 300 MG/ML  SOLN
100.0000 mL | Freq: Once | INTRAMUSCULAR | Status: AC | PRN
  Administered 2018-12-25: 14:00:00 100 mL via INTRAVENOUS

## 2018-12-25 MED ORDER — ESCITALOPRAM OXALATE 10 MG PO TABS
10.0000 mg | ORAL_TABLET | Freq: Every day | ORAL | Status: DC
  Administered 2018-12-25 – 2018-12-31 (×7): 10 mg via ORAL
  Filled 2018-12-25 (×7): qty 1

## 2018-12-25 MED ORDER — ONDANSETRON HCL 4 MG/2ML IJ SOLN: 4.0000 mg | Freq: Four times a day (QID) | INTRAMUSCULAR | Status: DC | PRN

## 2018-12-25 MED ORDER — MELATONIN 5 MG PO TABS
5.0000 mg | ORAL_TABLET | Freq: Every day | ORAL | Status: DC
  Administered 2018-12-25 – 2018-12-30 (×6): 5 mg via ORAL
  Filled 2018-12-25 (×8): qty 1

## 2018-12-25 MED ORDER — OXYCODONE-ACETAMINOPHEN 5-325 MG PO TABS
2.0000 | ORAL_TABLET | Freq: Once | ORAL | Status: AC
  Administered 2018-12-25: 2 via ORAL
  Filled 2018-12-25: qty 2

## 2018-12-25 NOTE — ED Notes (Signed)
This RN called pharm as unable to place melatonin order. Pharm states they will add order soon.

## 2018-12-25 NOTE — ED Notes (Signed)
Pt states pain unchanged post oxy.

## 2018-12-25 NOTE — ED Triage Notes (Signed)
PT from home via EMS with c/o mechanical fall , " the wind knocked me down" , pt fell onto flower pot on RT side, Denies any head injury. VSS, c/o RT side rib pain

## 2018-12-25 NOTE — ED Notes (Addendum)
Pt switched off 3L via [place] to 2L via simple aerosol mask over trach. Lights dimmed for pt. Denies any other needs.

## 2018-12-25 NOTE — ED Notes (Signed)
Pt's trach suctioned as requested.

## 2018-12-25 NOTE — ED Notes (Signed)
Patient transported to CT 

## 2018-12-25 NOTE — ED Notes (Signed)
Inc oxygen to 3L via [place] as pt keeps desat to high 80s low 90s.

## 2018-12-25 NOTE — ED Notes (Signed)
Provider [place] verbal okay for this RN to place melatonin 5mg  order as requested by pt.

## 2018-12-25 NOTE — ED Notes (Signed)
Pt states dilaudid got her from a 10/10 to an 8/10 for about 53mins; states it has inc again to 10/10.

## 2018-12-25 NOTE — ED Notes (Signed)
Pt given snack as requested. Given drink.

## 2018-12-25 NOTE — ED Notes (Signed)
This RN to bedside to silence monitor for pt. Denies any other needs.

## 2018-12-25 NOTE — ED Notes (Signed)
Pt requesting melatonin. States she takes it nightly before bed to help her sleep. Will message/page attending with request and check in with pharm.

## 2018-12-25 NOTE — ED Provider Notes (Signed)
[facility] Emergency Department Provider Note       Time seen: ----------------------------------------- 12:22 PM on 12/25/2018 -----------------------------------------   I have reviewed the triage vital signs and the nursing notes.  HISTORY   Chief Complaint Fall   HPI Angela David is a 78 y.o. female with a history of breast cancer, diabetes, GERD, hyperlipidemia, tracheostomy who presents to the ED for mechanical fall.  Patient states she was trying to open her screen door and the heavy when pulled her outside causing her to fall onto a flowerpot.  She is complaining of severe right side pain.  She denies any head injury, neck pain, arm or back pain.  Denies any leg pain.  Past Medical History:  Diagnosis Date  . Breast cancer ([place]) 2002   right breast chemo and radiation  . Cancer [facility]) 2002   right breast  . Diabetes mellitus without complication ([place])   . Diabetic peripheral neuropathy ([place]) 08/28/2016  . GERD (gastroesophageal reflux disease)   . Glaucoma   . High cholesterol   . History of colon polyps   . Peripheral neuropathy 08/28/2016  . Personal history of chemotherapy 2002   BREAST CA  . Personal history of malignant neoplasm of breast   . Personal history of radiation therapy 2002   BREAST CA  . Vocal cord paralysis 2012    Patient Active Problem List   Diagnosis Date Noted  . Colon polyp 11/06/2018  . Esophageal dysphagia   . 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 Stricture and stenosis of esophagus   . Elevated CEA 09/02/2018  . Goals of care, counseling/discussion 09/02/2018  . Polyp of transverse colon 09/02/2018  . Foreign body in right foot 05/26/2018  . History of tracheostomy 04/30/2018  . Moderate episode of recurrent major depressive disorder ([place]) 04/03/2018  . Poor balance 04/03/2018  . Shoulder lesion, right 02/28/2017  . Cervical radiculopathy 09/28/2016  . Peripheral neuropathy 08/28/2016  . Diabetic  peripheral neuropathy ([place]) 08/28/2016  . Leg weakness, bilateral 08/13/2016  . Gait abnormality 08/13/2016  . Carcinoma of overlapping sites of right breast in female, estrogen receptor negative ([place]) 12/27/2015  . Malignant neoplasm of breast ([place]) 03/28/2015  . 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 Hypercholesterolemia 03/28/2015  . Recurrent major depressive disorder, in partial remission ([place]) 03/01/2015  . 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 Cephalalgia 09/15/2014  . Headache due to trauma 09/15/2014  . Osteopenia 01/07/2014  . Age-related osteoporosis without current pathological fracture 01/07/2014  . History of colonic polyps 12/14/2013  . History of breast cancer 11/26/2012  . H/O malignant neoplasm of breast 11/26/2012  . 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 Tracheostomy present ([place]) 12/06/2011  . Calcification of bronchus or trachea 01/16/2011  . 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 Anxiety state 12/19/2010  . Blood in sputum 12/19/2010  . HLD (hyperlipidemia) 12/19/2010  . Type 2 diabetes mellitus ([place]) 12/19/2010    Past Surgical History:  Procedure Laterality Date  . ABDOMINAL HYSTERECTOMY    . BREAST BIOPSY Right 2008   neg  . BREAST EXCISIONAL BIOPSY Right 2002   positive  . BREAST LUMPECTOMY Right 2002   chemo and radiation  . BREAST SURGERY Right 2002   LUMPECTOMY, radiation, chemotherapy  . CHOLECYSTECTOMY N/A 12/10/2017   Procedure: LAPAROSCOPIC CHOLECYSTECTOMY;  Surgeon: Benjamine Sprague, DO;  Location: [facility] ORS;  Service: General;  Laterality: N/A;  . COLON RESECTION Right 11/06/2018   Procedure: HAND ASSISTED LAPAROSCOPIC COLON  RESECTION, RIGHT;  Surgeon: Jules Husbands, MD;  Location: [facility] ORS;  Service: General;  Laterality: Right;  . COLONOSCOPY  2009,12/30/2013  . COLONOSCOPY WITH PROPOFOL N/A 04/16/2018   Tubulovillous polyp proximal transverse colon, large sessile polyp, proximal transverse colon;  Surgeon: Robert Bellow, MD;  Location: [facility] ENDOSCOPY;  Service: Endoscopy;  Laterality: N/A;  . ESOPHAGOGASTRODUODENOSCOPY (EGD) WITH PROPOFOL N/A 10/14/2018   Procedure: ESOPHAGOGASTRODUODENOSCOPY (EGD) WITH PROPOFOL;  Surgeon: Lucilla Lame, MD;  Location: [facility] ENDOSCOPY;  Service: Endoscopy;  Laterality: N/A;  . HYDROCELE EXCISION / REPAIR    . TRACHEAL SURGERY  2013  . TRACHEOSTOMY N/A    Dr Kathyrn Sheriff    Allergies Shellfish allergy, Sodium sulfite, and Codeine  Social History Social History   Tobacco Use  . Smoking status: Never Smoker  . Smokeless tobacco: Never Used  Substance Use Topics  . Alcohol use: No  . Drug use: No    Review of Systems Constitutional: Negative for fever. Cardiovascular: Positive for right lower chest pain Respiratory: Negative for shortness of breath. Gastrointestinal: Negative for abdominal pain, vomiting and diarrhea. Musculoskeletal: Negative for back pain. Skin: Negative for rash. Neurological: Negative for headaches, focal weakness or numbness.  All systems negative/normal/unremarkable except as stated in the HPI  ____________________________________________   PHYSICAL EXAM:  VITAL SIGNS: ED Triage Vitals [12/25/18 1134]  Enc Vitals Group     BP (!) 123/53     Pulse Rate 95     Resp 16     Temp 97.6 F (36.4 C)     Temp Source Oral     SpO2 99 %     Weight      Height      Head Circumference      Peak Flow      Pain Score 8     Pain Loc      Pain Edu?      Excl. in [place]?    Constitutional: Alert and oriented. Well appearing and in no distress. Eyes: Conjunctivae are normal. Normal extraocular movements. ENT      Head: Normocephalic and atraumatic.      Nose: No congestion/rhinnorhea.      Mouth/Throat: Mucous membranes are moist.      Neck: No stridor. Cardiovascular: Normal rate, regular rhythm. No murmurs, rubs, or gallops. Respiratory: Normal respiratory effort without tachypnea nor retractions. Breath sounds are clear and equal  bilaterally. No wheezes/rales/rhonchi. Gastrointestinal: Soft and nontender. Normal bowel sounds Musculoskeletal: Severe inferior right axillary rib tenderness Neurologic:  Normal speech and language. No gross focal neurologic deficits are appreciated.  Skin:  Skin is warm, dry and intact. No rash noted. Psychiatric: Mood and affect are normal. Speech and behavior are normal.  ____________________________________________  ED COURSE:  As part of my medical decision making, I reviewed the following data within the [place] History obtained from family if available, nursing notes, old chart and ekg, as well as notes from prior ED visits. Patient presented for a mechanical fall with right rib pain, we will assess with labs and imaging as indicated at this time.   Procedures  Angela David was evaluated in Emergency Department on 12/25/2018 for the symptoms described in the history of present illness. She was evaluated in the context of the global COVID-19 pandemic, which necessitated consideration that the patient might be at risk for infection with the SARS-CoV-2 virus that causes COVID-19. Institutional protocols and algorithms that pertain to the evaluation of patients at risk for COVID-19  are in a state of rapid change based on information released by regulatory bodies including the CDC and federal and state organizations. These policies and algorithms were followed during the patient's care in the ED.  ____________________________________________   LABS (pertinent positives/negatives)  Labs Reviewed  CBC WITH DIFFERENTIAL/PLATELET - Abnormal; Notable for the following components:      Result Value   WBC 14.5 (*)    Neutro Abs 12.3 (*)    Abs Immature Granulocytes 0.08 (*)    All other components within normal limits  COMPREHENSIVE METABOLIC PANEL - Abnormal; Notable for the following components:   Glucose, Bld 143 (*)    AST 166 (*)    ALT 80 (*)    GFR calc  non Af Amer 57 (*)    All other components within normal limits    RADIOLOGY Images were viewed by me  Right rib x-rays reveal fracture/CT  IMPRESSION:  Displaced fractures of the posterior right fifth through ninth ribs.  Nondisplaced lateral fractures of the right sixth through tenth  ribs. Right pneumothorax, less than 5%. Small amount of right  pleural fluid.   Indeterminate lesion of the lower pole the right kidney that could  represent a hemorrhagic cyst or mass. This is larger than was seen  in 2012. Follow-up suggested with renal MRI.   Aortic atherosclerosis. Coronary artery calcification.   Chronic scarring of the anterior right upper lobe with chronic  benign pulmonary nodules on the right.  ____________________________________________   DIFFERENTIAL DIAGNOSIS   Rib fracture, contusion, sprain, liver laceration,  FINAL ASSESSMENT AND PLAN  Rib fractures, small apical pneumothorax   Plan: The patient had presented for mechanical fall.  Patient's imaging did reveal multiple rib fractures on the right side, for posterior and right lateral.  No obvious flail segment.  Pneumothorax is less than 5%.  I have discussed with general surgery for admission.  She will require admission for pain control and for repeat imaging.   Laurence Aly, MD    Note: This note was generated in part or whole with voice recognition software. Voice recognition is usually quite accurate but there are transcription errors that can and very often do occur. I apologize for any typographical errors that were not detected and corrected.     Earleen Newport, MD 12/25/18 1501
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
## 2018-12-25 NOTE — ED Notes (Signed)
Pt resting calmly in bed. Visitor at bedside.

## 2018-12-26 ENCOUNTER — Observation Stay: Payer: Medicare Other

## 2018-12-26 ENCOUNTER — Other Ambulatory Visit: Payer: Self-pay

## 2018-12-26 DIAGNOSIS — Y92009 Unspecified place in unspecified non-institutional (private) residence as the place of occurrence of the external cause: Secondary | ICD-10-CM | POA: Diagnosis not present

## 2018-12-26 DIAGNOSIS — H409 Unspecified glaucoma: Secondary | ICD-10-CM | POA: Diagnosis present

## 2018-12-26 DIAGNOSIS — Z7984 Long term (current) use of oral hypoglycemic drugs: Secondary | ICD-10-CM | POA: Diagnosis not present

## 2018-12-26 DIAGNOSIS — Z885 Allergy status to narcotic agent status: Secondary | ICD-10-CM | POA: Diagnosis not present

## 2018-12-26 DIAGNOSIS — Z853 Personal history of malignant neoplasm of breast: Secondary | ICD-10-CM | POA: Diagnosis not present

## 2018-12-26 DIAGNOSIS — F419 Anxiety disorder, unspecified: Secondary | ICD-10-CM | POA: Diagnosis present

## 2018-12-26 DIAGNOSIS — I700 Atherosclerosis of aorta: Secondary | ICD-10-CM | POA: Diagnosis present

## 2018-12-26 DIAGNOSIS — Z888 Allergy status to other drugs, medicaments and biological substances status: Secondary | ICD-10-CM | POA: Diagnosis not present

## 2018-12-26 DIAGNOSIS — S2241XA Multiple fractures of ribs, right side, initial encounter for closed fracture: Secondary | ICD-10-CM | POA: Diagnosis present

## 2018-12-26 DIAGNOSIS — W19XXXA Unspecified fall, initial encounter: Secondary | ICD-10-CM | POA: Diagnosis not present

## 2018-12-26 DIAGNOSIS — S270XXA Traumatic pneumothorax, initial encounter: Secondary | ICD-10-CM | POA: Diagnosis present

## 2018-12-26 DIAGNOSIS — Z91018 Allergy to other foods: Secondary | ICD-10-CM | POA: Diagnosis not present

## 2018-12-26 DIAGNOSIS — E1142 Type 2 diabetes mellitus with diabetic polyneuropathy: Secondary | ICD-10-CM | POA: Diagnosis present

## 2018-12-26 DIAGNOSIS — Z930 Tracheostomy status: Secondary | ICD-10-CM | POA: Diagnosis not present

## 2018-12-26 DIAGNOSIS — Z20828 Contact with and (suspected) exposure to other viral communicable diseases: Secondary | ICD-10-CM | POA: Diagnosis present

## 2018-12-26 DIAGNOSIS — K219 Gastro-esophageal reflux disease without esophagitis: Secondary | ICD-10-CM | POA: Diagnosis present

## 2018-12-26 DIAGNOSIS — Z923 Personal history of irradiation: Secondary | ICD-10-CM | POA: Diagnosis not present

## 2018-12-26 DIAGNOSIS — J9811 Atelectasis: Secondary | ICD-10-CM | POA: Diagnosis present

## 2018-12-26 DIAGNOSIS — Z79899 Other long term (current) drug therapy: Secondary | ICD-10-CM | POA: Diagnosis not present

## 2018-12-26 DIAGNOSIS — I2510 Atherosclerotic heart disease of native coronary artery without angina pectoris: Secondary | ICD-10-CM | POA: Diagnosis present

## 2018-12-26 DIAGNOSIS — Z86010 Personal history of colonic polyps: Secondary | ICD-10-CM | POA: Diagnosis not present

## 2018-12-26 DIAGNOSIS — E785 Hyperlipidemia, unspecified: Secondary | ICD-10-CM | POA: Diagnosis present

## 2018-12-26 DIAGNOSIS — Z9221 Personal history of antineoplastic chemotherapy: Secondary | ICD-10-CM | POA: Diagnosis not present

## 2018-12-26 DIAGNOSIS — W01198A Fall on same level from slipping, tripping and stumbling with subsequent striking against other object, initial encounter: Secondary | ICD-10-CM | POA: Diagnosis present

## 2018-12-26 DIAGNOSIS — E7800 Pure hypercholesterolemia, unspecified: Secondary | ICD-10-CM | POA: Diagnosis present

## 2018-12-26 DIAGNOSIS — J939 Pneumothorax, unspecified: Secondary | ICD-10-CM | POA: Diagnosis present

## 2018-12-26 LAB — CREATININE, SERUM
Creatinine, Ser: 1.17 mg/dL — ABNORMAL HIGH (ref 0.44–1.00)
GFR calc Af Amer: 52 mL/min — ABNORMAL LOW (ref >60)
GFR calc non Af Amer: 45 mL/min — ABNORMAL LOW (ref >60)

## 2018-12-26 LAB — CBC
HCT: 41.4 % (ref 36.0–46.0)
Hemoglobin: 12.4 g/dL (ref 12.0–15.0)
MCH: 26 pg (ref 26.0–34.0)
MCHC: 30 g/dL (ref 30.0–36.0)
MCV: 86.8 fL (ref 80.0–100.0)
Platelets: 185 10*3/uL (ref 150–400)
RBC: 4.77 MIL/uL (ref 3.87–5.11)
RDW: 13 % (ref 11.5–15.5)
WBC: 12.5 10*3/uL — ABNORMAL HIGH (ref 4.0–10.5)
nRBC: 0 % (ref 0.0–0.2)

## 2018-12-26 MED ORDER — PRAVASTATIN SODIUM 20 MG PO TABS
20.0000 mg | ORAL_TABLET | Freq: Every day | ORAL | Status: DC
  Administered 2018-12-26 – 2018-12-30 (×5): 20 mg via ORAL
  Filled 2018-12-26 (×7): qty 1

## 2018-12-26 MED ORDER — CYCLOSPORINE 0.05 % OP EMUL
1.0000 [drp] | Freq: Two times a day (BID) | OPHTHALMIC | Status: DC
  Administered 2018-12-26 – 2018-12-31 (×11): 1 [drp] via OPHTHALMIC
  Filled 2018-12-26 (×13): qty 30

## 2018-12-26 MED ORDER — PANTOPRAZOLE SODIUM 40 MG PO TBEC
40.0000 mg | DELAYED_RELEASE_TABLET | Freq: Every day | ORAL | Status: DC
  Administered 2018-12-26 – 2018-12-31 (×6): 40 mg via ORAL
  Filled 2018-12-26 (×6): qty 1

## 2018-12-26 MED ORDER — ENOXAPARIN SODIUM 40 MG/0.4ML ~~LOC~~ SOLN
40.0000 mg | SUBCUTANEOUS | Status: DC
  Administered 2018-12-26 – 2018-12-31 (×6): 40 mg via SUBCUTANEOUS
  Filled 2018-12-26 (×6): qty 0.4

## 2018-12-26 MED ORDER — ACETAMINOPHEN 500 MG PO TABS
500.0000 mg | ORAL_TABLET | Freq: Every day | ORAL | Status: DC
  Administered 2018-12-26 – 2018-12-31 (×6): 500 mg via ORAL
  Filled 2018-12-26 (×6): qty 1

## 2018-12-26 MED ORDER — METOPROLOL TARTRATE 25 MG PO TABS
25.0000 mg | ORAL_TABLET | Freq: Two times a day (BID) | ORAL | Status: DC
  Administered 2018-12-26 – 2018-12-31 (×9): 25 mg via ORAL
  Filled 2018-12-26 (×12): qty 1

## 2018-12-26 MED ORDER — GABAPENTIN 600 MG PO TABS
600.0000 mg | ORAL_TABLET | Freq: Three times a day (TID) | ORAL | Status: DC
  Administered 2018-12-26 – 2018-12-31 (×14): 600 mg via ORAL
  Filled 2018-12-26 (×18): qty 1

## 2018-12-26 MED ORDER — METHOCARBAMOL 500 MG PO TABS
500.0000 mg | ORAL_TABLET | Freq: Four times a day (QID) | ORAL | Status: DC | PRN
  Administered 2018-12-26: 500 mg via ORAL
  Filled 2018-12-26 (×2): qty 1

## 2018-12-26 MED ORDER — ROPINIROLE HCL 1 MG PO TABS
0.5000 mg | ORAL_TABLET | Freq: Every day | ORAL | Status: DC
  Administered 2018-12-26 – 2018-12-30 (×5): 0.5 mg via ORAL
  Filled 2018-12-26 (×2): qty 1
  Filled 2018-12-26: qty 2
  Filled 2018-12-26 (×3): qty 1

## 2018-12-26 MED ORDER — BUSPIRONE HCL 10 MG PO TABS
30.0000 mg | ORAL_TABLET | Freq: Two times a day (BID) | ORAL | Status: DC
  Administered 2018-12-26 – 2018-12-31 (×11): 30 mg via ORAL
  Filled 2018-12-26 (×11): qty 3

## 2018-12-26 NOTE — Plan of Care (Signed)

## 2018-12-26 NOTE — ED Provider Notes (Signed)
Was contacted by the radiologist this morning about patient's repeat chest x-ray ordered by the surgery team.  Patient has been boarding in the emergency department, admitted to the surgical team.  The new x-ray shows a persistent small pneumothorax that is slightly bigger.  Patient remains hemodynamically stable.  I personally spoke with Dr. Windell Moment via telephone about these findings.   Rudene Re, MD 12/26/18 [phone]

## 2018-12-26 NOTE — ED Notes (Signed)
Report received from Rebecca, RN.

## 2018-12-26 NOTE — Progress Notes (Signed)
[facility] SURGICAL PROGRESS NOTE (cpt [id number])  Hospital Day(s): 0.   Post op day(s):  Marland Kitchen   Interval History: Patient seen and examined, no acute events or new complaints overnight. Patient remains in ED awaiting hospital bed.  Patient reports pain is controlled with medication Repeat CXR this morning shows small right apical PTX, mild increase. Switched to 100% O2 with NRB Tachycardic 114 No other complaints, thankful for admission here   Review of Systems:  Constitutional: denies fever, chills  HEENT: denies cough or congestion  Respiratory: denies any shortness of breath  Cardiovascular: denies chest pain or palpitations  Gastrointestinal: denies abdominal pain, N/V, or diarrhea/and bowel function as per interval history Genitourinary: denies burning with urination or urinary frequency   Vital signs in last 24 hours: [min-max] current  Temp:  [97.6 F (36.4 C)] 97.6 F (36.4 C) (10/29 1134) Pulse Rate:  [77-116] 114 (10/30 0639) Resp:  [16-17] 17 (10/30 0400) BP: (101-148)/(45-86) 132/48 (10/30 0639) SpO2:  [93 %-99 %] 95 % (10/30 0630)             Intake/Output last 2 shifts:  10/29 0701 - 10/30 0700 In: -  Out: 150 [Urine:150]   Physical Exam:  Constitutional: alert, cooperative and no distress  HENT: normocephalic without obvious abnormality, tracheostomy  Eyes: PERRL, EOM's grossly intact and symmetric  Respiratory: breathing non-labored at rest  Cardiovascular: tachycardic and sinus rhythm  Chest: Ecchymosis and tenderness to the right lateral chest wall Musculoskeletal: no edema or wounds, motor and sensation grossly intact, NT    Labs:  CBC Latest Ref Rng & Units 12/25/2018 11/07/2018 11/06/2018  WBC 4.0 - 10.5 K/uL 14.5(H) 12.2(H) 14.1(H)  Hemoglobin 12.0 - 15.0 g/dL 12.2 11.6(L) 12.7  Hematocrit 36.0 - 46.0 % 40.0 37.3 40.1  Platelets 150 - 400 K/uL 201 164 175   CMP Latest Ref Rng & Units 12/25/2018 11/07/2018 11/06/2018  Glucose 70 - 99  mg/dL 143(H) 173(H) -  BUN 8 - 23 mg/dL 18 18 -  Creatinine 0.44 - 1.00 mg/dL 0.97 1.07(H) 1.01(H)  Sodium 135 - 145 mmol/L 138 141 -  Potassium 3.5 - 5.1 mmol/L 4.9 4.1 -  Chloride 98 - 111 mmol/L 102 115(H) -  CO2 22 - 32 mmol/L 22 22 -  Calcium 8.9 - 10.3 mg/dL 10.1 7.7(L) -  Total Protein 6.5 - 8.1 g/dL 8.0 - -  Total Bilirubin 0.3 - 1.2 mg/dL 0.9 - -  Alkaline Phos 38 - 126 U/L 58 - -  AST 15 - 41 U/L 166(H) - -  ALT 0 - 44 U/L 80(H) - -     Imaging studies:   CXR (12/26/2018) personally reviewed which shows somewhat larger right apical PTX, multiple rib fx again seen, and radiologist report reviewed:  IMPRESSION: Enlarged but still small right pneumothorax at the apex.   Assessment/Plan: (ICD-10's: S22.41XA, W19.Merril Abbe) 78 y.o. female with multiple right sided posterior rib fractures and slightly enlarging right apical PTX secondary to mechanical fall.   - Regular diet  - Continue multimodal pain control  - We will repeat a CXR this afternoon and in the AM; if worsening PTX she may require chest tube placement   - Pulmonary toilet   - mobilize as tolerates   - medical management of comorbidities; home medications   All of the above findings and recommendations were discussed with the patient, and the medical team, and all of patient's questions were answered to her expressed satisfaction.  -- Edison Simon, PA-C [facility] 12/26/2018, 7:49 AM [phone] M-F: 7am - 4pm

## 2018-12-26 NOTE — ED Notes (Signed)
Attempted to call report to 1A. Will be notified and called back.

## 2018-12-26 NOTE — ED Notes (Signed)
Pt resting calmly in bed. Alert. Denies any needs. Bed locked low. Rail up. Call bell within reach. In NAD.

## 2018-12-26 NOTE — ED Notes (Signed)
Pt sleeping. Bed locked low. Call bell within reach. Rail up.  

## 2018-12-27 ENCOUNTER — Inpatient Hospital Stay: Payer: Medicare Other

## 2018-12-27 LAB — BASIC METABOLIC PANEL
Anion gap: 8 (ref 5–15)
BUN: 20 mg/dL (ref 8–23)
CO2: 28 mmol/L (ref 22–32)
Calcium: 9.6 mg/dL (ref 8.9–10.3)
Chloride: 102 mmol/L (ref 98–111)
Creatinine, Ser: 0.93 mg/dL (ref 0.44–1.00)
GFR calc Af Amer: >60 mL/min (ref >60)
GFR calc non Af Amer: 60 mL/min — ABNORMAL LOW (ref >60)
Glucose, Bld: 164 mg/dL — ABNORMAL HIGH (ref 70–99)
Potassium: 5 mmol/L (ref 3.5–5.1)
Sodium: 138 mmol/L (ref 135–145)

## 2018-12-27 LAB — CBC WITH DIFFERENTIAL/PLATELET
Abs Immature Granulocytes: 0.03 10*3/uL (ref 0.00–0.07)
Basophils Absolute: 0 10*3/uL (ref 0.0–0.1)
Basophils Relative: 0 %
Eosinophils Absolute: 0 10*3/uL (ref 0.0–0.5)
Eosinophils Relative: 0 %
HCT: 42.7 % (ref 36.0–46.0)
Hemoglobin: 12.8 g/dL (ref 12.0–15.0)
Immature Granulocytes: 0 %
Lymphocytes Relative: 7 %
Lymphs Abs: 0.8 10*3/uL (ref 0.7–4.0)
MCH: 25.7 pg — ABNORMAL LOW (ref 26.0–34.0)
MCHC: 30 g/dL (ref 30.0–36.0)
MCV: 85.6 fL (ref 80.0–100.0)
Monocytes Absolute: 0.5 10*3/uL (ref 0.1–1.0)
Monocytes Relative: 4 %
Neutro Abs: 11.2 10*3/uL — ABNORMAL HIGH (ref 1.7–7.7)
Neutrophils Relative %: 89 %
Platelets: 192 10*3/uL (ref 150–400)
RBC: 4.99 MIL/uL (ref 3.87–5.11)
RDW: 12.6 % (ref 11.5–15.5)
WBC: 12.6 10*3/uL — ABNORMAL HIGH (ref 4.0–10.5)
nRBC: 0 % (ref 0.0–0.2)

## 2018-12-27 LAB — HEPATIC FUNCTION PANEL
ALT: 48 U/L — ABNORMAL HIGH (ref 0–44)
AST: 35 U/L (ref 15–41)
Albumin: 3.6 g/dL (ref 3.5–5.0)
Alkaline Phosphatase: 69 U/L (ref 38–126)
Bilirubin, Direct: 0.1 mg/dL (ref 0.0–0.2)
Indirect Bilirubin: 0.6 mg/dL (ref 0.3–0.9)
Total Bilirubin: 0.7 mg/dL (ref 0.3–1.2)
Total Protein: 8 g/dL (ref 6.5–8.1)

## 2018-12-27 LAB — GLUCOSE, CAPILLARY: Glucose-Capillary: 155 mg/dL — ABNORMAL HIGH (ref 70–99)

## 2018-12-27 NOTE — Progress Notes (Signed)
RRT called, patient states she couldn't breath and was pale in color. O2 sats 83 with trach collar. Jasmine RN and Resp deep suctioned patient. Patient able to breath better and O2 sats up to 93-95 now. BP 147/78. Patient stable currently.

## 2018-12-27 NOTE — Progress Notes (Signed)
RT responded to Rapid response to find patient with decreased SPO2 and stating she was having trouble breathing. This RT lavaged patients tracheostomy tube with Saline, there proceeded to suction tracheostomy getting back moderate amount of thick pluggish looking sputum. Patient subsequently stated she was breathing better and saturations rose up to 92-95% on 28% ATC.

## 2018-12-27 NOTE — Progress Notes (Signed)
Dr Lysle Pearl notified of rapid response, patient stable.

## 2018-12-27 NOTE — Significant Event (Signed)
Rapid Response Event Note  Overview: Time Called: 1812 Arrival Time: [id number] Event Type: Respiratory  Initial Focused Assessment: called for RR on patient with old tracheostomy having decreased 02 sats. Pt sitting up in bed had been eating dinner. Resp tech in room suctioning trach...   Interventions: Resp tech suctioning trach and increased 02 on trach collar. Pt says doing much better... 02 sats 93%  Plan of Care (if not transferred): Estill Bamberg RN will notify attending MD and call if further assistance needed.  Event Summary:   at      at          [place]

## 2018-12-27 NOTE — Progress Notes (Signed)
Patient's husband states patient isnt as alert as she was yesterday. VSS, blood sugar 155, falling asleep between care. Dr Lysle Pearl text paged.

## 2018-12-28 ENCOUNTER — Inpatient Hospital Stay: Payer: Medicare Other

## 2018-12-28 LAB — CBC WITH DIFFERENTIAL/PLATELET
Abs Immature Granulocytes: 0.04 10*3/uL (ref 0.00–0.07)
Basophils Absolute: 0 10*3/uL (ref 0.0–0.1)
Basophils Relative: 0 %
Eosinophils Absolute: 0 10*3/uL (ref 0.0–0.5)
Eosinophils Relative: 0 %
HCT: 39 % (ref 36.0–46.0)
Hemoglobin: 11.8 g/dL — ABNORMAL LOW (ref 12.0–15.0)
Immature Granulocytes: 0 %
Lymphocytes Relative: 11 %
Lymphs Abs: 1.1 10*3/uL (ref 0.7–4.0)
MCH: 25.9 pg — ABNORMAL LOW (ref 26.0–34.0)
MCHC: 30.3 g/dL (ref 30.0–36.0)
MCV: 85.5 fL (ref 80.0–100.0)
Monocytes Absolute: 0.5 10*3/uL (ref 0.1–1.0)
Monocytes Relative: 5 %
Neutro Abs: 8 10*3/uL — ABNORMAL HIGH (ref 1.7–7.7)
Neutrophils Relative %: 84 %
Platelets: 187 10*3/uL (ref 150–400)
RBC: 4.56 MIL/uL (ref 3.87–5.11)
RDW: 12.4 % (ref 11.5–15.5)
WBC: 9.6 10*3/uL (ref 4.0–10.5)
nRBC: 0 % (ref 0.0–0.2)

## 2018-12-28 LAB — BASIC METABOLIC PANEL
Anion gap: 9 (ref 5–15)
BUN: 24 mg/dL — ABNORMAL HIGH (ref 8–23)
CO2: 29 mmol/L (ref 22–32)
Calcium: 9.4 mg/dL (ref 8.9–10.3)
Chloride: 102 mmol/L (ref 98–111)
Creatinine, Ser: 1.02 mg/dL — ABNORMAL HIGH (ref 0.44–1.00)
GFR calc Af Amer: >60 mL/min (ref >60)
GFR calc non Af Amer: 53 mL/min — ABNORMAL LOW (ref >60)
Glucose, Bld: 147 mg/dL — ABNORMAL HIGH (ref 70–99)
Potassium: 4.4 mmol/L (ref 3.5–5.1)
Sodium: 140 mmol/L (ref 135–145)

## 2018-12-28 LAB — HEPATIC FUNCTION PANEL
ALT: 35 U/L (ref 0–44)
AST: 23 U/L (ref 15–41)
Albumin: 3.2 g/dL — ABNORMAL LOW (ref 3.5–5.0)
Alkaline Phosphatase: 59 U/L (ref 38–126)
Bilirubin, Direct: 0.1 mg/dL (ref 0.0–0.2)
Indirect Bilirubin: 1 mg/dL — ABNORMAL HIGH (ref 0.3–0.9)
Total Bilirubin: 1.1 mg/dL (ref 0.3–1.2)
Total Protein: 7.2 g/dL (ref 6.5–8.1)

## 2018-12-28 MED ORDER — IBUPROFEN 400 MG PO TABS
600.0000 mg | ORAL_TABLET | Freq: Four times a day (QID) | ORAL | Status: DC | PRN
  Administered 2018-12-28 – 2018-12-30 (×6): 600 mg via ORAL
  Filled 2018-12-28 (×6): qty 2

## 2018-12-28 NOTE — Plan of Care (Signed)

## 2018-12-28 NOTE — Progress Notes (Signed)
Trach care provided. Cleansed and new gauze in place

## 2018-12-28 NOTE — Progress Notes (Signed)
Subjective:  CC: Angela David is a 78 y.o. female  Hospital stay day 2,   Pneumothorax and rib fractures  HPI: Patient much more oriented today.  Awake at bedside.  No specific complaints except for pain along the right side as expected.  Communicating clearly by plugging trach site with finger.  ROS:  General: Denies weight loss, weight gain, fatigue, fevers, chills, and night sweats. Heart: Denies chest pain, palpitations, racing heart, irregular heartbeat, leg pain or swelling, and decreased activity tolerance. Respiratory: Denies breathing difficulty, shortness of breath, wheezing, cough, and sputum. GI: Denies change in appetite, heartburn, nausea, vomiting, constipation, diarrhea, and blood in stool. GU: Denies difficulty urinating, pain with urinating, urgency, frequency, blood in urine.   Objective:   Temp:  [98.3 F (36.8 C)-98.8 F (37.1 C)] 98.4 F (36.9 C) (11/01 0714) Pulse Rate:  [80-87] 82 (11/01 0714) Resp:  [19-22] 19 (11/01 0714) BP: (106-147)/(43-78) 116/43 (11/01 0714) SpO2:  [83 %-95 %] 91 % (11/01 0714) FiO2 (%):  [28 %] 28 % (10/31 1953)             Intake/Output this shift:   Intake/Output Summary (Last 24 hours) at 12/28/2018 0852 Last data filed at 12/28/2018 0640 Gross per 24 hour  Intake 240 ml  Output 600 ml  Net -360 ml    Constitutional :  alert, cooperative, appears stated age and no distress  Respiratory:  clear to auscultation bilaterally.  Tenderness to palpation along right chest wall as expected  Cardiovascular:  regular rate and rhythm  Gastrointestinal: soft, non-tender; bowel sounds normal; no masses,  no organomegaly.   Skin: Cool and moist.  No obvious bruising noted  Psychiatric: Normal affect, non-agitated, not confused       LABS:  CMP Latest Ref Rng & Units 12/28/2018 12/27/2018 12/26/2018  Glucose 70 - 99 mg/dL 147(H) 164(H) -  BUN 8 - 23 mg/dL 24(H) 20 -  Creatinine 0.44 - 1.00 mg/dL 1.02(H) 0.93 1.17(H)  Sodium 135  - 145 mmol/L 140 138 -  Potassium 3.5 - 5.1 mmol/L 4.4 5.0 -  Chloride 98 - 111 mmol/L 102 102 -  CO2 22 - 32 mmol/L 29 28 -  Calcium 8.9 - 10.3 mg/dL 9.4 9.6 -  Total Protein 6.5 - 8.1 g/dL 7.2 8.0 -  Total Bilirubin 0.3 - 1.2 mg/dL 1.1 0.7 -  Alkaline Phos 38 - 126 U/L 59 69 -  AST 15 - 41 U/L 23 35 -  ALT 0 - 44 U/L 35 48(H) -   CBC Latest Ref Rng & Units 12/28/2018 12/27/2018 12/26/2018  WBC 4.0 - 10.5 K/uL 9.6 12.6(H) 12.5(H)  Hemoglobin 12.0 - 15.0 g/dL 11.8(L) 12.8 12.4  Hematocrit 36.0 - 46.0 % 39.0 42.7 41.4  Platelets 150 - 400 K/uL 187 192 185    RADS: CLINICAL DATA:  Pneumothorax  EXAM: CHEST  1 VIEW  COMPARISON:  Chest x-rays dated 12/27/2018 and 12/26/2018.  FINDINGS: Stable small RIGHT apical pneumothorax. Increased opacity at the RIGHT lung base, likely atelectasis and/or small pleural effusion. Heart size and mediastinal contours are stable. Tracheostomy tube appears appropriately positioned in the midline.  IMPRESSION: 1. Stable small RIGHT apical pneumothorax. 2. Increased opacity at the RIGHT lung base, likely atelectasis and/or small pleural effusion.   Electronically Signed   By: Franki Cabot M.D.   On: 12/28/2018 08:36  Assessment:   Right-sided rib fractures and pneumothorax.  Stable on chest x-ray but with increased opacity at right lung base.  Vitals  remained stable with adequate saturation.  Likely atelectasis at this point.  Encourage patient to continue take deep breaths.  Also asked patient if she is comfortable going home with oral pain meds only since that is what she is taking currently.  Patient forcefully requested that she stay in the hospital for another day of observation, with severe anxiety noted about the prospect of potentially being discharged today.  We can continue to watch her for another day to ensure pain is adequately controlled and she has no further issues.

## 2018-12-28 NOTE — Progress Notes (Signed)
Subjective:  CC: Angela David is a 78 y.o. female  Hospital stay day 1,   PTX  HPI: No acute issues overnight.  Husband is concerned that patient's mentation is much slower than usual and she is not as responsive.  ROS:  Unable to assess secondary to pain patient mentation  Objective:   Temp:  [98.3 F (36.8 C)-98.8 F (37.1 C)] 98.4 F (36.9 C) (11/01 0714) Pulse Rate:  [80-87] 82 (11/01 0714) Resp:  [19-22] 19 (11/01 0714) BP: (106-147)/(43-78) 116/43 (11/01 0714) SpO2:  [83 %-95 %] 91 % (11/01 0714) FiO2 (%):  [28 %] 28 % (10/31 1953)             Intake/Output this shift:   Intake/Output Summary (Last 24 hours) at 12/28/2018 0848 Last data filed at 12/28/2018 0640 Gross per 24 hour  Intake 240 ml  Output 600 ml  Net -360 ml    Constitutional :  appears stated age, no distress, slowed mentation and Somnolent and only opens eyes with gentle sternal rub.  Respiratory:  clear to auscultation bilaterally.  Tenderness to palpation along right chest wall  Cardiovascular:  regular rate and rhythm  Gastrointestinal: soft, non-tender; bowel sounds normal; no masses,  no organomegaly.   Skin: Cool and moist.   Psychiatric: Normal affect, non-agitated, not confused       LABS:  CMP Latest Ref Rng & Units 12/28/2018 12/27/2018 12/26/2018  Glucose 70 - 99 mg/dL 147(H) 164(H) -  BUN 8 - 23 mg/dL 24(H) 20 -  Creatinine 0.44 - 1.00 mg/dL 1.02(H) 0.93 1.17(H)  Sodium 135 - 145 mmol/L 140 138 -  Potassium 3.5 - 5.1 mmol/L 4.4 5.0 -  Chloride 98 - 111 mmol/L 102 102 -  CO2 22 - 32 mmol/L 29 28 -  Calcium 8.9 - 10.3 mg/dL 9.4 9.6 -  Total Protein 6.5 - 8.1 g/dL 7.2 8.0 -  Total Bilirubin 0.3 - 1.2 mg/dL 1.1 0.7 -  Alkaline Phos 38 - 126 U/L 59 69 -  AST 15 - 41 U/L 23 35 -  ALT 0 - 44 U/L 35 48(H) -   CBC Latest Ref Rng & Units 12/28/2018 12/27/2018 12/26/2018  WBC 4.0 - 10.5 K/uL 9.6 12.6(H) 12.5(H)  Hemoglobin 12.0 - 15.0 g/dL 11.8(L) 12.8 12.4  Hematocrit 36.0 - 46.0 %  39.0 42.7 41.4  Platelets 150 - 400 K/uL 187 192 185    RADS: CLINICAL DATA:  No pneumothorax on the RIGHT after fall with rib fractures.  EXAM: CHEST - 2 VIEW  COMPARISON:  Chest x-ray dated 12/26/2018.  FINDINGS: Stable small RIGHT apical pneumothorax. Stable opacity at the RIGHT lung base, presumed atelectasis. Stable elevation of the RIGHT hemidiaphragm. Heart size and mediastinal contours are stable. Displaced rib fractures again noted on the RIGHT.  IMPRESSION: 1. Stable small RIGHT apical pneumothorax. 2. Stable opacity at the RIGHT lung base, presumed atelectasis. 3. Displaced rib fractures on the RIGHT.   Electronically Signed   By: Franki Cabot M.D.   On: 12/27/2018 08:57 Assessment:   Status post fall, pneumothorax.  Presenting today with increased somnolence.  Vitals remained stable, patient is arousable with a gentle sternal rub but goes back to sleep.  Review of chart shows no other likely cause of this increased somnolence.  She does not display any signs of unilateral weakness.  Likely due to pain medication.  We will remove all narcotics and continue to monitor.  UPDATE: Notified of a rapid response due to decreased O2  saturation.  Suctioning provided through her trach by respiratory tech with return of greenish sputum.  O2 sat did increase at that time.  She is more alert and oriented per nurse report.  Later on the night, I assessed the patient and she was indeed to be a little bit more responsive to questions, not requiring a sternal rub to wake from sleep.  Vitals remained stable and patient looks comfortable.  We will continue to monitor closely.

## 2018-12-28 NOTE — Progress Notes (Signed)
Deep suctioned per patient request

## 2018-12-29 ENCOUNTER — Telehealth: Payer: Self-pay | Admitting: Surgery

## 2018-12-29 LAB — BASIC METABOLIC PANEL
Anion gap: 13 (ref 5–15)
BUN: 28 mg/dL — ABNORMAL HIGH (ref 8–23)
CO2: 26 mmol/L (ref 22–32)
Calcium: 9.4 mg/dL (ref 8.9–10.3)
Chloride: 102 mmol/L (ref 98–111)
Creatinine, Ser: 1.05 mg/dL — ABNORMAL HIGH (ref 0.44–1.00)
GFR calc Af Amer: 60 mL/min — ABNORMAL LOW (ref >60)
GFR calc non Af Amer: 52 mL/min — ABNORMAL LOW (ref >60)
Glucose, Bld: 154 mg/dL — ABNORMAL HIGH (ref 70–99)
Potassium: 4.2 mmol/L (ref 3.5–5.1)
Sodium: 141 mmol/L (ref 135–145)

## 2018-12-29 LAB — CBC WITH DIFFERENTIAL/PLATELET
Abs Immature Granulocytes: 0.04 10*3/uL (ref 0.00–0.07)
Basophils Absolute: 0 10*3/uL (ref 0.0–0.1)
Basophils Relative: 0 %
Eosinophils Absolute: 0.2 10*3/uL (ref 0.0–0.5)
Eosinophils Relative: 2 %
HCT: 37.8 % (ref 36.0–46.0)
Hemoglobin: 11.6 g/dL — ABNORMAL LOW (ref 12.0–15.0)
Immature Granulocytes: 0 %
Lymphocytes Relative: 13 %
Lymphs Abs: 1.2 10*3/uL (ref 0.7–4.0)
MCH: 25.7 pg — ABNORMAL LOW (ref 26.0–34.0)
MCHC: 30.7 g/dL (ref 30.0–36.0)
MCV: 83.6 fL (ref 80.0–100.0)
Monocytes Absolute: 0.5 10*3/uL (ref 0.1–1.0)
Monocytes Relative: 6 %
Neutro Abs: 7.2 10*3/uL (ref 1.7–7.7)
Neutrophils Relative %: 79 %
Platelets: 187 10*3/uL (ref 150–400)
RBC: 4.52 MIL/uL (ref 3.87–5.11)
RDW: 12.3 % (ref 11.5–15.5)
WBC: 9.1 10*3/uL (ref 4.0–10.5)
nRBC: 0 % (ref 0.0–0.2)

## 2018-12-29 LAB — HEPATIC FUNCTION PANEL
ALT: 24 U/L (ref 0–44)
AST: 16 U/L (ref 15–41)
Albumin: 3.2 g/dL — ABNORMAL LOW (ref 3.5–5.0)
Alkaline Phosphatase: 56 U/L (ref 38–126)
Bilirubin, Direct: 0.2 mg/dL (ref 0.0–0.2)
Indirect Bilirubin: 1 mg/dL — ABNORMAL HIGH (ref 0.3–0.9)
Total Bilirubin: 1.2 mg/dL (ref 0.3–1.2)
Total Protein: 7.1 g/dL (ref 6.5–8.1)

## 2018-12-29 MED ORDER — OXYCODONE HCL 5 MG PO TABS: 5.0000 mg | ORAL_TABLET | Freq: Four times a day (QID) | ORAL | 0 refills | Status: DC | PRN

## 2018-12-29 MED ORDER — IBUPROFEN 600 MG PO TABS: 600.0000 mg | ORAL_TABLET | Freq: Four times a day (QID) | ORAL | 0 refills | Status: DC | PRN

## 2018-12-29 MED ORDER — METHOCARBAMOL 500 MG PO TABS: 500.0000 mg | ORAL_TABLET | Freq: Three times a day (TID) | ORAL | 0 refills | Status: DC | PRN
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
## 2018-12-29 NOTE — Progress Notes (Signed)
[facility] SURGICAL PROGRESS NOTE (cpt [phone])  Hospital Day(s): 3.   Interval History: Patient seen and examined, no acute events or new complaints overnight. Patient reports her pain is improved but she remains sore on her right lateral chest wall. No OB. No other associated symptoms. Patient was set to be discharge home however RN was concerned over functional mobility. PT was consulted and recommended SNF.  Review of Systems:  Constitutional: denies fever, chills  HEENT: denies cough or congestion  Respiratory: denies any shortness of breath  Cardiovascular:  + chest pain (MSK - right), denied palpitations  Gastrointestinal: denies abdominal pain, N/V, or diarrhea/and bowel function as per interval history Genitourinary: denies burning with urination or urinary frequency  Vital signs in last 24 hours: [min-max] current  Temp:  [98.1 F (36.7 C)-98.3 F (36.8 C)] 98.1 F (36.7 C) (11/02 0751) Pulse Rate:  [75-87] 87 (11/02 0943) Resp:  [16-19] 16 (11/02 0751) BP: (106-127)/(48-52) 121/49 (11/02 0943) SpO2:  [91 %-98 %] 92 % (11/02 1418)             Intake/Output last 2 shifts:  11/01 0701 - 11/02 0700 In: 240 [P.O.:240] Out: 0    Physical Exam:  Constitutional: alert, cooperative and no distress  HENT: normocephalic without obvious abnormality, tracheostomy  Eyes: PERRL, EOM's grossly intact and symmetric  Respiratory: breathing non-labored at rest  Cardiovascular: tachycardic and sinus rhythm  Chest: Ecchymosis and tenderness to the right lateral chest wall Musculoskeletal: no edema or wounds, motor and sensation grossly intact, NT    Labs:  CBC Latest Ref Rng & Units 12/29/2018 12/28/2018 12/27/2018  WBC 4.0 - 10.5 K/uL 9.1 9.6 12.6(H)  Hemoglobin 12.0 - 15.0 g/dL 11.6(L) 11.8(L) 12.8  Hematocrit 36.0 - 46.0 % 37.8 39.0 42.7  Platelets 150 - 400 K/uL 187 187 192   CMP Latest Ref Rng & Units 12/29/2018 12/28/2018 12/27/2018  Glucose 70 - 99 mg/dL  154(H) 147(H) 164(H)  BUN 8 - 23 mg/dL 28(H) 24(H) 20  Creatinine 0.44 - 1.00 mg/dL 1.05(H) 1.02(H) 0.93  Sodium 135 - 145 mmol/L 141 140 138  Potassium 3.5 - 5.1 mmol/L 4.2 4.4 5.0  Chloride 98 - 111 mmol/L 102 102 102  CO2 22 - 32 mmol/L 26 29 28   Calcium 8.9 - 10.3 mg/dL 9.4 9.4 9.6  Total Protein 6.5 - 8.1 g/dL 7.1 7.2 8.0  Total Bilirubin 0.3 - 1.2 mg/dL 1.2 1.1 0.7  Alkaline Phos 38 - 126 U/L 56 59 69  AST 15 - 41 U/L 16 23 35  ALT 0 - 44 U/L 24 35 48(H)    Imaging studies: No new pertinent imaging studies   Assessment/Plan: (ICD-10's: S22.41XA,W19.Merril Abbe) 79 y.o. female overall improving although pain is interfering with mobility secondary to multiple right sided posterior rib fractures and slightly enlarging right apical PTX secondary to mechanical fall.   - Regular diet             - Continue multimodal pain control   - Pulmonary toilet              - mobilize as tolerates; working with PT; recommending SNF              - medical management of comorbidities; home medications     - Discharge planning; will await rehab placment   All of the above findings and recommendations were discussed with the patient, patient's family (husband), and the medical team, and all of patient's and family's questions were answered  to their expressed satisfaction.  -- Edison Simon, PA-C [facility] 12/29/2018, 2:23 PM [phone] M-F: 7am - 4pm

## 2018-12-29 NOTE — Plan of Care (Signed)

## 2018-12-29 NOTE — Progress Notes (Signed)
D: Pt alert and oriented. Pain well managed throughout the day.   A: Scheduled medications administered to pt, per MD orders. Support and encouragement provided. Frequent verbal contact made.   R: No adverse drug reactions noted. Pt complaint with medications and treatment plan. Pt interacts well with staff on the unit. Pt is stable, will continue to monitor and provide care as ordered.

## 2018-12-29 NOTE — Telephone Encounter (Signed)
Order is in for Chest xray.
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
## 2018-12-29 NOTE — Care Management Important Message (Signed)
Important Message  Patient Details  Name: Angela David MRN: [id number] Date of Birth: [birth year]   Medicare Important Message Given:  Yes     [name] 12/29/2018, 12:01 PM

## 2018-12-29 NOTE — Progress Notes (Signed)
Oxygen is 92-93% on room air at rest. Pt will work with PT.

## 2018-12-29 NOTE — NC FL2 (Signed)
[place] LEVEL OF CARE SCREENING TOOL     IDENTIFICATION  Patient Name: Angela David Birthdate: September 22, 1940 Sex: female Admission Date (Current Location): 12/25/2018  [place] and [place] Number:  Engineering geologist and Address:  [facility], [address] [place], [place] [address]      Provider Number: [id number]  Attending Physician Name and Address:  Jules Husbands, MD  Relative Name and Phone Number:  Danashia Bohley SPouse [phone]    Current Level of Care: Hospital Recommended Level of Care: [place] Prior Approval Number:    Date Approved/Denied:   PASRR Number: [id number]  Discharge Plan: SNF    Current Diagnoses: Patient Active Problem List   Diagnosis Date Noted  . Multiple fractures of ribs, right side, initial encounter for closed fracture 12/25/2018  . Colon polyp 11/06/2018  . Esophageal dysphagia   . 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 Stricture and stenosis of esophagus   . Elevated CEA 09/02/2018  . Goals of care, counseling/discussion 09/02/2018  . Polyp of transverse colon 09/02/2018  . Foreign body in right foot 05/26/2018  . History of tracheostomy 04/30/2018  . Moderate episode of recurrent major depressive disorder ([place]) 04/03/2018  . Poor balance 04/03/2018  . Shoulder lesion, right 02/28/2017  . Cervical radiculopathy 09/28/2016  . Peripheral neuropathy 08/28/2016  . Diabetic peripheral neuropathy ([place]) 08/28/2016  . Leg weakness, bilateral 08/13/2016  . Gait abnormality 08/13/2016  . Carcinoma of overlapping sites of right breast in female, estrogen receptor negative ([place]) 12/27/2015  . Malignant neoplasm of breast ([place]) 03/28/2015  . 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 Hypercholesterolemia 03/28/2015  . Recurrent major depressive disorder, in partial remission ([place]) 03/01/2015  . 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  Cephalalgia 09/15/2014  . Headache due to trauma 09/15/2014  . Osteopenia 01/07/2014  . Age-related osteoporosis without current pathological fracture 01/07/2014  . History of colonic polyps 12/14/2013  . History of breast cancer 11/26/2012  . H/O malignant neoplasm of breast 11/26/2012  . 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 Tracheostomy present ([place]) 12/06/2011  . Calcification of bronchus or trachea 01/16/2011  . 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 Anxiety state 12/19/2010  . Blood in sputum 12/19/2010  . HLD (hyperlipidemia) 12/19/2010  . Type 2 diabetes mellitus ([place]) 12/19/2010    Orientation RESPIRATION BLADDER Height & Weight     Self, Time, Situation, Place  O2, Tracheostomy Continent Weight:   Height:     BEHAVIORAL SYMPTOMS/MOOD NEUROLOGICAL BOWEL NUTRITION STATUS      Continent    AMBULATORY STATUS COMMUNICATION OF NEEDS Skin   Extensive Assist Verbally Bruising                       Personal Care Assistance Level of Assistance  Dressing     Dressing Assistance: Limited assistance     Functional Limitations Info             SPECIAL CARE FACTORS FREQUENCY  PT (By licensed PT)     PT Frequency: 5 times per week              Contractures Contractures Info: Not present    Additional Factors Info  Allergies, Code Status Code Status Info: Full code Allergies Info: Shellfish Allergy, Sodium Sulfite, Codeine           Current Medications (12/29/2018):  This is the current hospital active medication list Current Facility-Administered Medications  Medication Dose Route Frequency Provider Last Rate Last Dose  . acetaminophen (TYLENOL) tablet 500 mg  500 mg Oral Daily Tylene Fantasia, PA-C   500 mg at 12/29/18 [id number]  . busPIRone (BUSPAR) tablet 30 mg  30 mg Oral BID Tylene Fantasia, PA-C   30 mg at 12/29/18 [id number]  . cycloSPORINE (RESTASIS) 0.05 % ophthalmic emulsion 1 drop  1 drop Both Eyes BID Tylene Fantasia, PA-C   1 drop at 12/29/18 0950   . enoxaparin (LOVENOX) injection 40 mg  40 mg Subcutaneous Q24H Tylene Fantasia, PA-C   40 mg at 12/29/18 0516  . escitalopram (LEXAPRO) tablet 10 mg  10 mg Oral Daily Tylene Fantasia, PA-C   10 mg at 12/29/18 0949  . escitalopram (LEXAPRO) tablet 20 mg  20 mg Oral Daily Edison Simon R, PA-C   20 mg at 12/29/18 1002  . gabapentin (NEURONTIN) tablet 600 mg  600 mg Oral TID Edison Simon R, PA-C   600 mg at 12/28/18 2214  . ibuprofen (ADVIL) tablet 600 mg  600 mg Oral Q6H PRN Sakai, Isami, DO   600 mg at 12/29/18 1153  . Melatonin TABS 5 mg  5 mg Oral QHS Citrin, Levada Dy, MD   5 mg at 12/28/18 2214  . metoprolol tartrate (LOPRESSOR) tablet 25 mg  25 mg Oral BID Tylene Fantasia, PA-C   25 mg at 12/28/18 2214  . ondansetron (ZOFRAN-ODT) disintegrating tablet 4 mg  4 mg Oral Q6H PRN Tylene Fantasia, PA-C   4 mg at 12/25/18 1608   Or  . ondansetron (ZOFRAN) injection 4 mg  4 mg Intravenous Q6H PRN Tylene Fantasia, PA-C      . pantoprazole (PROTONIX) EC tablet 40 mg  40 mg Oral Daily Tylene Fantasia, PA-C   40 mg at 12/29/18 0949  . pravastatin (PRAVACHOL) tablet 20 mg  20 mg Oral q1800 Tylene Fantasia, PA-C   20 mg at 12/28/18 [id number]  . rOPINIRole (REQUIP) tablet 0.5 mg  0.5 mg Oral QHS Tylene Fantasia, PA-C   0.5 mg at 12/28/18 2214     Discharge Medications: Please see discharge summary for a list of discharge medications.  Relevant Imaging Results:  Relevant Lab Results:   Additional Information SS# [id number]  Su Hilt, RN

## 2018-12-29 NOTE — Progress Notes (Signed)
Patient deep suctioned with 41fr

## 2018-12-29 NOTE — TOC Initial Note (Signed)
Transition of Care [facility]) - Initial/Assessment Note    Patient Details  Name: Angela David MRN: [id number] Date of Birth: 1941/01/24  [facility]) CM/SW Contact:    Su Hilt, RN Phone Number: 12/29/2018, 2:27 PM  Clinical Narrative:                 Met with the patient and her spouse in the room, She is agreeable to go to rehab and to styart the bed search.  Completed Fl2, Passr number [id number], bed search sent        Patient Goals and CMS Choice        Expected Discharge Plan and Services           Expected Discharge Date: 12/29/18                                    Prior Living Arrangements/Services                       Activities of Daily Living Home Assistive Devices/Equipment: None ADL Screening (condition at time of admission) Patient's cognitive ability adequate to safely complete daily activities?: Yes Is the patient deaf or have difficulty hearing?: No Does the patient have difficulty seeing, even when wearing glasses/contacts?: No Does the patient have difficulty concentrating, remembering, or making decisions?: No Patient able to express need for assistance with ADLs?: Yes Does the patient have difficulty dressing or bathing?: Yes Independently performs ADLs?: No Does the patient have difficulty walking or climbing stairs?: Yes Weakness of Legs: Both Weakness of Arms/Hands: Both  Permission Sought/Granted                  Emotional Assessment              Admission diagnosis:  Pneumothorax on right [J93.9] Fall, initial encounter [W19.XXXA] Closed fracture of multiple ribs of right side, initial encounter [S22.41XA] Multiple fractures of ribs, right side, initial encounter for closed fracture [S22.41XA] Pneumothorax, unspecified type [J93.9] Patient Active Problem List   Diagnosis Date Noted  . Multiple fractures of ribs, right side, initial encounter for closed fracture 12/25/2018  .  Colon polyp 11/06/2018  . Esophageal dysphagia   . 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 Stricture and stenosis of esophagus   . Elevated CEA 09/02/2018  . Goals of care, counseling/discussion 09/02/2018  . Polyp of transverse colon 09/02/2018  . Foreign body in right foot 05/26/2018  . History of tracheostomy 04/30/2018  . Moderate episode of recurrent major depressive disorder ([place]) 04/03/2018  . Poor balance 04/03/2018  . Shoulder lesion, right 02/28/2017  . Cervical radiculopathy 09/28/2016  . Peripheral neuropathy 08/28/2016  . Diabetic peripheral neuropathy ([place]) 08/28/2016  . Leg weakness, bilateral 08/13/2016  . Gait abnormality 08/13/2016  . Carcinoma of overlapping sites of right breast in female, estrogen receptor negative ([place]) 12/27/2015  . Malignant neoplasm of breast ([place]) 03/28/2015  . 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 Hypercholesterolemia 03/28/2015  . Recurrent major depressive disorder, in partial remission ([place]) 03/01/2015  . 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 Cephalalgia 09/15/2014  . Headache due to trauma 09/15/2014  . Osteopenia 01/07/2014  . Age-related osteoporosis without current pathological fracture 01/07/2014  . History of colonic polyps 12/14/2013  . History of breast cancer 11/26/2012  . H/O malignant neoplasm of breast 11/26/2012  . 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  Tracheostomy present ([place]) 12/06/2011  . Calcification of bronchus or trachea 01/16/2011  . 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 Anxiety state 12/19/2010  . Blood in sputum 12/19/2010  . HLD (hyperlipidemia) 12/19/2010  . Type 2 diabetes mellitus ([place]) 12/19/2010   PCP:  Iva Boop, MD Pharmacy:   [facility], [address] [place] [address] Phone: [phone] Fax: [phone]  [facility] [place] [place]  [address] Phone: [phone] Fax: [phone]     Social Determinants of Health (SDOH) Interventions    Readmission Risk Interventions No flowsheet data found.

## 2018-12-29 NOTE — Telephone Encounter (Signed)
Hospital called and made an appt for this patient, patient needs a chest xray prior to coming in on Friday will you please make sure there is an order in for the patient to go have a chest xray.

## 2018-12-30 LAB — HEPATIC FUNCTION PANEL
ALT: 21 U/L (ref 0–44)
AST: 22 U/L (ref 15–41)
Albumin: 3.2 g/dL — ABNORMAL LOW (ref 3.5–5.0)
Alkaline Phosphatase: 60 U/L (ref 38–126)
Bilirubin, Direct: 0.2 mg/dL (ref 0.0–0.2)
Indirect Bilirubin: 0.8 mg/dL (ref 0.3–0.9)
Total Bilirubin: 1 mg/dL (ref 0.3–1.2)
Total Protein: 7.5 g/dL (ref 6.5–8.1)

## 2018-12-30 LAB — BASIC METABOLIC PANEL
Anion gap: 12 (ref 5–15)
BUN: 22 mg/dL (ref 8–23)
CO2: 25 mmol/L (ref 22–32)
Calcium: 9.3 mg/dL (ref 8.9–10.3)
Chloride: 103 mmol/L (ref 98–111)
Creatinine, Ser: 1.01 mg/dL — ABNORMAL HIGH (ref 0.44–1.00)
GFR calc Af Amer: >60 mL/min (ref >60)
GFR calc non Af Amer: 54 mL/min — ABNORMAL LOW (ref >60)
Glucose, Bld: 173 mg/dL — ABNORMAL HIGH (ref 70–99)
Potassium: 3.8 mmol/L (ref 3.5–5.1)
Sodium: 140 mmol/L (ref 135–145)

## 2018-12-30 LAB — CBC WITH DIFFERENTIAL/PLATELET
Abs Immature Granulocytes: 0.03 10*3/uL (ref 0.00–0.07)
Basophils Absolute: 0 10*3/uL (ref 0.0–0.1)
Basophils Relative: 0 %
Eosinophils Absolute: 0.1 10*3/uL (ref 0.0–0.5)
Eosinophils Relative: 2 %
HCT: 39.4 % (ref 36.0–46.0)
Hemoglobin: 12.4 g/dL (ref 12.0–15.0)
Immature Granulocytes: 0 %
Lymphocytes Relative: 12 %
Lymphs Abs: 1.1 10*3/uL (ref 0.7–4.0)
MCH: 25.8 pg — ABNORMAL LOW (ref 26.0–34.0)
MCHC: 31.5 g/dL (ref 30.0–36.0)
MCV: 81.9 fL (ref 80.0–100.0)
Monocytes Absolute: 0.5 10*3/uL (ref 0.1–1.0)
Monocytes Relative: 6 %
Neutro Abs: 7.1 10*3/uL (ref 1.7–7.7)
Neutrophils Relative %: 80 %
Platelets: 203 10*3/uL (ref 150–400)
RBC: 4.81 MIL/uL (ref 3.87–5.11)
RDW: 12.6 % (ref 11.5–15.5)
WBC: 8.9 10*3/uL (ref 4.0–10.5)
nRBC: 0 % (ref 0.0–0.2)

## 2018-12-30 MED ORDER — BISACODYL 10 MG RE SUPP
10.0000 mg | Freq: Every day | RECTAL | Status: DC | PRN
  Administered 2018-12-30: 10 mg via RECTAL
  Filled 2018-12-30: qty 1

## 2018-12-30 NOTE — Progress Notes (Signed)
Physical Therapy Treatment Patient Details Name: Angela David MRN: [id number] DOB: 08-16-1940 Today's Date: 12/30/2018    History of Present Illness Pt is a 78 year old female admitted s/p fall in front of her home which resulted in R side displaced rib fractures.  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    PT Comments    Pt demonstrated improvement in bed mobility, STS transfer and gait today.  She was able to get to bed side on R side CGA, stated understanding that exiting to the L side with log roll can help with pain management if needed.  Pt able to stand slowly from elevated bedside with min A and use of RW, PT and nursing student assisting with tracheostomy tubing.  She was able to take 4-5 steps towards the window, still low foot clearance but improved compared to yesterday, and back to the chair to sit down slowly with use of UE's.  Pt reported lower pain level today and did state that she felt SOB.  Pt tachycardic when vitals tested and RN notified that pt requesting her Beta blocker.  Pt will continue to benefit from skilled PT with focus on strength, tolerance to activity, safe functional mobility and pain management.  Pt still appropriate for SNF placement due to mobility deficits.  Should pt choose to return home, she will need Home health PT services.  Pt has a hospital bed and BSC at home.  Follow Up Recommendations  SNF     Equipment Recommendations  None recommended by PT    Recommendations for Other Services       Precautions / Restrictions Precautions Precautions: Fall Restrictions Weight Bearing Restrictions: No    Mobility  Bed Mobility Overal bed mobility: Needs Assistance Bed Mobility: Supine to Sit     Supine to sit: Min guard;HOB elevated     General bed mobility comments: Pt able to exit to R side today with minimal effort, HOB elevated.  PT educated about log rolling  for pain management and exiting to L side if pt is pain limited in the future.  Transfers Overall transfer level: Needs assistance Equipment used: Rolling walker (2 wheeled) Transfers: Sit to/from Stand Sit to Stand: Min assist;From elevated surface         General transfer comment: Pt able to slowly stand from elevated bedside with cues for body mechanics and use of RW, no increased effort today.  PT assisted in managing equipment/tracheostomy.  Ambulation/Gait Ambulation/Gait assistance: Min guard Gait Distance (Feet): 6 Feet Assistive device: Rolling walker (2 wheeled)     Gait velocity interpretation: <1.8 ft/sec, indicate of risk for recurrent falls General Gait Details: Pt able to take larger steps today to window and then back to chair, no report of pain increase.  She was able to manage RW and slowly lower to chair with use of UE's.  Pt's HR did elevate to 107BPM and pt reported mild SOB, O2 sats WNL.   Stairs             Wheelchair Mobility    Modified Rankin (Stroke Patients Only)       Balance Overall balance assessment: Needs assistance Sitting-balance support: Bilateral upper extremity supported;Feet supported Sitting balance-Leahy Scale: Good       Standing balance-Leahy Scale: Fair Standing balance comment: Relies on RW, flexed posture.  Cognition Arousal/Alertness: Awake/alert Behavior During Therapy: WFL for tasks assessed/performed Overall Cognitive Status: Within Functional Limits for tasks assessed                                 General Comments: Very pleasant and open to education.      Exercises Other Exercises Other Exercises: Time to manage tubing and vitals with assistance from nursing student. x4 min    General Comments        Pertinent Vitals/Pain Pain Assessment: Faces Faces Pain Scale: Hurts a little bit Pain Location: Rib pain "is better today". Pain Intervention(s):  Monitored during session    Home Living                      Prior Function            PT Goals (current goals can now be found in the care plan section) Acute Rehab PT Goals Patient Stated Goal: To eventually return home and regain independence. PT Goal Formulation: With patient Time For Goal Achievement: 01/12/19 Potential to Achieve Goals: Good    Frequency    7X/week      PT Plan Current plan remains appropriate    Co-evaluation              AM-PAC PT "6 Clicks" Mobility   Outcome Measure  Help needed turning from your back to your side while in a flat bed without using bedrails?: A Little Help needed moving from lying on your back to sitting on the side of a flat bed without using bedrails?: A Little Help needed moving to and from a bed to a chair (including a wheelchair)?: A Little Help needed standing up from a chair using your arms (e.g., wheelchair or bedside chair)?: A Little Help needed to walk in hospital room?: A Little Help needed climbing 3-5 steps with a railing? : A Lot 6 Click Score: 17    End of Session Equipment Utilized During Treatment: Gait belt Activity Tolerance: Patient limited by fatigue;Patient limited by pain Patient left: with call bell/phone within reach;in chair Nurse Communication: Mobility status PT Visit Diagnosis: Unsteadiness on feet (R26.81);Muscle weakness (generalized) (M62.81);History of falling (Z91.81)     Time: [id number] PT Time Calculation (min) (ACUTE ONLY): 23 min  Charges:  $Therapeutic Exercise: 8-22 mins $Therapeutic Activity: 8-22 mins                     Roxanne Gates, PT, DPT    Roxanne Gates 12/30/2018, 10:23 AM

## 2018-12-30 NOTE — TOC Progression Note (Addendum)
Transition of Care [facility]) - Progression Note    Patient Details  Name: Angela David MRN: [id number] Date of Birth: 1940-05-10  [facility]) CM/SW Contact  Fionn Stracke, Lenice Llamas Phone Number: [phone]  12/30/2018, 2:56 PM  Clinical Narrative: Clinical Social Worker (CSW) met with patient and her husband Wille Glaser was at bedside. Patient reported that she prefers to D/C home and does not want to go to a SNF. Per husband patient has had a trach since 2012 and they get the trach supplies through [employer]. CSW made patient and husband aware that there are no SNF bed offers due to the trach. Husband reported that they have a ramp and a wheel chair at home for patient. Patient reported that she does not need a hospital bed and has a bed with rails on it. Patient and husband are agreeable to home health and do not have a home health agency preference. [facility] representative is reviewing referral.     4:45 pm: Per Corene Cornea [facility] can accept patient for PT only. CSW met with patient and made her aware that [facility] can accept her for home health PT only. CSW provided patient with CMS home health list.          Expected Discharge Plan and Services           Expected Discharge Date: 12/29/18                                     Social Determinants of Health (SDOH) Interventions    Readmission Risk Interventions No flowsheet data found.

## 2018-12-30 NOTE — Progress Notes (Signed)
Trach care performed on patient. During suctioning noticed that catheter could not pass through trach. Patient stated she could not breath as good and to please remove trach. Trach had large dried plug in it. Sterile technique was performed while cleaning trach and was placed back in with no complications. Inner canulla was placed inside trach with no issues. Patient stated that she was able to breathe much better. I encouraged her to keep aerosol trach collar on to help with secretions becoming so thick. RN was notified and I checked with patient again to make sure she was OK and she stated she was fine and was thankful.

## 2018-12-30 NOTE — Progress Notes (Signed)
Pt has not had a bowel movement since admission, MD [facility] notified, requesting suppository. Suppository ordered.

## 2018-12-30 NOTE — Progress Notes (Signed)
   12/30/18 1100  Clinical Encounter Type  Visited With Patient  Visit Type Follow-up  Referral From Palliative care team   Chaplain received a referral from a Palliative Care NP who noted that the patient is the sister of the patient in 76. This chaplain was made aware that this may be a difficult time for this patient and she may appreciate some support. Chaplain talked with the patient briefly. She appeared to be resting. Will visit later this afternoon.

## 2018-12-30 NOTE — Progress Notes (Signed)
Pt required deep suctioning X2 this shift. Unable to remove plug during initial event. RT [place] notified, lmultiple large mucous plugs removed, pt was able to mobilize secretions after.

## 2018-12-30 NOTE — Progress Notes (Signed)
[facility] SURGICAL PROGRESS NOTE (cpt [phone])  Hospital Day(s): 4.   Interval History: Patient seen and examined, overnight had issues with mucus plugging but is feeling better this morning. Patient reports still with right chest wall soreness. No fever, chills, nausea, emesis, or SOB. Awaiting placement. Has not been mobilizing as well as she should, likely secondary to pain.   Review of Systems:  Constitutional: denies fever, chills  HEENT: denies cough or congestion  Respiratory: denies any shortness of breath  Cardiovascular:  + chest pain (MSK - right), denied palpitations  Gastrointestinal: denies abdominal pain, N/V, or diarrhea/and bowel function as per interval history Genitourinary: denies burning with urination or urinary frequency   Vital signs in last 24 hours: [min-max] current  Temp:  [97.8 F (36.6 C)-98.1 F (36.7 C)] 97.8 F (36.6 C) (11/02 2313) Pulse Rate:  [80-96] 89 (11/02 2313) Resp:  [16-18] 18 (11/02 2313) BP: (121-141)/(48-54) 141/54 (11/02 2313) SpO2:  [92 %-98 %] 94 % (11/02 2313)             Intake/Output last 2 shifts:  11/02 0701 - 11/03 0700 In: 600 [P.O.:600] Out: -    Physical Exam:  Constitutional: alert, cooperative and no distress  HENT: normocephalic without obvious abnormality, tracheostomy Eyes: PERRL, EOM's grossly intact and symmetric  Respiratory: breathing non-labored at rest  Cardiovascular:tachycardicand sinus rhythm  Chest:Ecchymosis and tenderness to the right lateral chest wall Musculoskeletal: no edema or wounds, motor and sensation grossly intact, NT    Labs:  CBC Latest Ref Rng & Units 12/30/2018 12/29/2018 12/28/2018  WBC 4.0 - 10.5 K/uL 8.9 9.1 9.6  Hemoglobin 12.0 - 15.0 g/dL 12.4 11.6(L) 11.8(L)  Hematocrit 36.0 - 46.0 % 39.4 37.8 39.0  Platelets 150 - 400 K/uL 203 187 187   CMP Latest Ref Rng & Units 12/30/2018 12/29/2018 12/28/2018  Glucose 70 - 99 mg/dL 173(H) 154(H) 147(H)  BUN 8 - 23 mg/dL  22 28(H) 24(H)  Creatinine 0.44 - 1.00 mg/dL 1.01(H) 1.05(H) 1.02(H)  Sodium 135 - 145 mmol/L 140 141 140  Potassium 3.5 - 5.1 mmol/L 3.8 4.2 4.4  Chloride 98 - 111 mmol/L 103 102 102  CO2 22 - 32 mmol/L 25 26 29   Calcium 8.9 - 10.3 mg/dL 9.3 9.4 9.4  Total Protein 6.5 - 8.1 g/dL 7.5 7.1 7.2  Total Bilirubin 0.3 - 1.2 mg/dL 1.0 1.2 1.1  Alkaline Phos 38 - 126 U/L 60 56 59  AST 15 - 41 U/L 22 16 23   ALT 0 - 44 U/L 21 24 35     Imaging studies: No new pertinent imaging studies   Assessment/Plan: (ICD-10's: S22.41XA,W19.Merril Abbe) 78 y.o. female with persistent right sided musculoskeletal chest pain attributable to secondary to multiple right sided posterior rib fractures and stable, decreasing, right apical PTX secondary to mechanical fall.   - Regular diet - Continue multimodal pain control              - Pulmonary toilet - mobilize as tolerates; working with PT; recommending SNF - medical management of comorbidities; home medications               - Discharge planning; will await rehab placment   All of the above findings and recommendations were discussed with the patient, and the medical team, and all of patient's questions were answered to her expressed satisfaction.  -- Edison Simon, PA-C [facility] 12/30/2018, 7:33 AM [phone] M-F: 7am - 4pm

## 2018-12-31 LAB — BASIC METABOLIC PANEL
Anion gap: 11 (ref 5–15)
BUN: 18 mg/dL (ref 8–23)
CO2: 26 mmol/L (ref 22–32)
Calcium: 9.2 mg/dL (ref 8.9–10.3)
Chloride: 104 mmol/L (ref 98–111)
Creatinine, Ser: 0.74 mg/dL (ref 0.44–1.00)
GFR calc Af Amer: >60 mL/min (ref >60)
GFR calc non Af Amer: >60 mL/min (ref >60)
Glucose, Bld: 157 mg/dL — ABNORMAL HIGH (ref 70–99)
Potassium: 3.9 mmol/L (ref 3.5–5.1)
Sodium: 141 mmol/L (ref 135–145)

## 2018-12-31 LAB — CBC WITH DIFFERENTIAL/PLATELET
Abs Immature Granulocytes: 0.03 10*3/uL (ref 0.00–0.07)
Basophils Absolute: 0 10*3/uL (ref 0.0–0.1)
Basophils Relative: 0 %
Eosinophils Absolute: 0.2 10*3/uL (ref 0.0–0.5)
Eosinophils Relative: 3 %
HCT: 39.7 % (ref 36.0–46.0)
Hemoglobin: 12.3 g/dL (ref 12.0–15.0)
Immature Granulocytes: 0 %
Lymphocytes Relative: 18 %
Lymphs Abs: 1.3 10*3/uL (ref 0.7–4.0)
MCH: 25.4 pg — ABNORMAL LOW (ref 26.0–34.0)
MCHC: 31 g/dL (ref 30.0–36.0)
MCV: 81.9 fL (ref 80.0–100.0)
Monocytes Absolute: 0.4 10*3/uL (ref 0.1–1.0)
Monocytes Relative: 6 %
Neutro Abs: 5.2 10*3/uL (ref 1.7–7.7)
Neutrophils Relative %: 73 %
Platelets: 236 10*3/uL (ref 150–400)
RBC: 4.85 MIL/uL (ref 3.87–5.11)
RDW: 12.6 % (ref 11.5–15.5)
WBC: 7.2 10*3/uL (ref 4.0–10.5)
nRBC: 0 % (ref 0.0–0.2)

## 2018-12-31 LAB — HEPATIC FUNCTION PANEL
ALT: 17 U/L (ref 0–44)
AST: 16 U/L (ref 15–41)
Albumin: 3.2 g/dL — ABNORMAL LOW (ref 3.5–5.0)
Alkaline Phosphatase: 58 U/L (ref 38–126)
Bilirubin, Direct: 0.1 mg/dL (ref 0.0–0.2)
Indirect Bilirubin: 0.8 mg/dL (ref 0.3–0.9)
Total Bilirubin: 0.9 mg/dL (ref 0.3–1.2)
Total Protein: 7.6 g/dL (ref 6.5–8.1)

## 2018-12-31 NOTE — TOC Progression Note (Addendum)
Transition of Care [facility]) - Progression Note    Patient Details  Name: Angela David MRN: [id number] Date of Birth: [birth year]  Transition of Care [facility]) CM/SW Contact  Monzerat Handler, Lenice Llamas Phone Number: [phone]  12/31/2018, 11:57 AM  Clinical Narrative: Clinical Social Worker (CSW) received a call from [employer] stating that patient is refusing to go home. CSW met with patient and husband Angela David at bedside to address concerns. Per patient she is weak and wants to work with PT before she goes home. CSW explained that PT has worked with patient and they are recommending SNF however patient has no bed offers due to the trach. Patient reported that she does not want to go to SNF. CSW provided emotional support and requested PT to see patient today prior to D/C home.     4:12 pm: PT has worked with patient this afternoon and per PT patient is ready to D/C home. RN aware and has called EMS. Please reconsult if future social work needs arise. CSW signing off.       Barriers to Discharge: Barriers Resolved  Expected Discharge Plan and Services           Expected Discharge Date: 12/31/18               DME Arranged: N/A         HH Arranged: PT [place]: [place] ([facility]) Date [place]: 12/31/18 Time [place]: 1021 Representative spoke with at [place]: [place] ([place]) Interventions    Readmission Risk Interventions No flowsheet data found.

## 2018-12-31 NOTE — Care Management Important Message (Signed)
Important Message  Patient Details  Name: Angela David MRN: [id number] Date of Birth: Jul 18, 1940   Medicare Important Message Given:  Yes     [name] 12/31/2018, 10:42 AM

## 2018-12-31 NOTE — Progress Notes (Signed)
EMS called for transport.

## 2018-12-31 NOTE — TOC Transition Note (Addendum)
Transition of Care [facility]) - CM/SW Discharge Note   Patient Details  Name: Angela David MRN: [id number] Date of Birth: [birth year]  Transition of Care [facility]) CM/SW Contact:  Amon Costilla, Lenice Llamas Phone Number: [phone]  12/31/2018, 10:22 AM   Clinical Narrative: Clinical Social Worker (CSW) notified [place] representative that patient will D/C home today. Patient's husband [name] is aware of D/C today and requested EMS for transport. CSW confirmed patient's address with [name]. Per [name] he is coming to [facility] this morning to bring patient clothes for transport. Per [name] they have a humidifier in their bedroom. Patient is aware of above. RN aware of above. Please reconsult if future social work needs arise. CSW signing off.     11:30 am: Per [place] [facility] [employer] has provided patient with humidification in the home.     Final next level of care: [place] Barriers to Discharge: Barriers Resolved   Patient Goals and CMS Choice Patient states their goals for this hospitalization and ongoing recovery are:: To go home. CMS Medicare.gov Compare Post Acute Care list provided to:: Patient Choice offered to / list presented to : Patient  Discharge Placement                Patient to be transferred to facility by: [facility] Name of family member notified: Patient's husband [name] is aware of D/C today. Patient and family notified of of transfer: 12/31/18  Discharge Plan and Services                DME Arranged: N/A         HH Arranged: PT [place] Agency: [place] ([place]) Date [place]: 12/31/18 Time [place]: 1021 Representative spoke with at [place]: [place] ([place]) Interventions     Readmission Risk Interventions No flowsheet data found.

## 2018-12-31 NOTE — TOC Progression Note (Signed)
Transition of Care [facility]) - Progression Note    Patient Details  Name: Angela David MRN: [id number] Date of Birth: April 17, 1940  Transition of Care [facility]) CM/SW Contact   Kolbe, Lenice Llamas Phone Number: [phone]  12/31/2018, 9:09 AM  Clinical Narrative: Clinical Social Worker (CSW) notified [facility] [employer] agency representative that per respiratory therapist patient would benefit from a humidifier. [employer] provides patient's trach supplies at home.             Expected Discharge Plan and Services           Expected Discharge Date: 12/29/18                                     Social Determinants of Health (SDOH) Interventions    Readmission Risk Interventions No flowsheet data found.

## 2018-12-31 NOTE — Progress Notes (Signed)
Discharge instruction reviewed with pt. Pt verbalized understanding of instructions. Prescriptions called in to pharmacy. Discharge packet given to pt. Pt is awaiting transport home via EMS
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
## 2018-12-31 NOTE — Progress Notes (Signed)
Physical Therapy Treatment Patient Details Name: Angela David MRN: [id number] DOB: [birth year] Today's Date: 12/31/2018    History of Present Illness Pt is a 78 year old female admitted s/p fall in front of her home which resulted in R side displaced rib fractures.  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    PT Comments    Pt agreeable to PT; reports mild pain in R ribs/flank, but tolerable with keeping RUE in chest level range or lower. Pt demonstrates Mod I bed mobility. STS from low bed position with Min A and Min guard with mild reliance on rolling walker to ambulate bed to chair. Pt demonstrates safe ability/stability to lower self to chair. Pt educated on both upper and lower body exercises mostly without added resistance at this time for upper body exercises except biceps curl; pt instructed on when/how to use thera band (pt has at home) for several lower body exercises. Pt given written home exercise program and encouraged to allow home PT to progress her safely. Pt received up in chair comfortably. Pt plans to discharge home today to continue strengthening toward baseline. Pt has equipment and care givers in place.    Follow Up Recommendations  Home health PT     Equipment Recommendations       Recommendations for Other Services       Precautions / Restrictions Precautions Precautions: Fall Restrictions Weight Bearing Restrictions: No    Mobility  Bed Mobility Overal bed mobility: Modified Independent Bed Mobility: Supine to Sit     Supine to sit: HOB elevated;Modified independent (Device/Increase time)     General bed mobility comments: Increased time  Transfers Overall transfer level: Needs assistance Equipment used: Rolling walker (2 wheeled) Transfers: Sit to/from Stand Sit to Stand: Min assist(from low bed)         General transfer comment: Good use of hands Min A to  stand; Min guard to sit with good eccentric control  Ambulation/Gait Ambulation/Gait assistance: Min guard Gait Distance (Feet): 4 Feet Assistive device: Rolling walker (2 wheeled) Gait Pattern/deviations: Step-to pattern     General Gait Details: slow step to pattern; steady with mild reliance on rw   Stairs             Wheelchair Mobility    Modified Rankin (Stroke Patients Only)       Balance Overall balance assessment: Needs assistance Sitting-balance support: Bilateral upper extremity supported;Feet supported Sitting balance-Leahy Scale: Good     Standing balance support: Bilateral upper extremity supported Standing balance-Leahy Scale: Fair                              Cognition Arousal/Alertness: Awake/alert Behavior During Therapy: WFL for tasks assessed/performed Overall Cognitive Status: Within Functional Limits for tasks assessed                                        Exercises General Exercises - Upper Extremity Shoulder Flexion: AROM;Both;10 reps Shoulder Horizontal ABduction: AROM;Both;10 reps Elbow Flexion: AROM;Both;10 reps General Exercises - Lower Extremity Ankle Circles/Pumps: AROM;Both;10 reps Quad Sets: Strengthening;Both;10 reps Long Arc Quad: AROM;Both;10 reps;Other (comment)(educated on use of t band) Heel Slides: Other (comment)(reviewed verbally with HEP) Hip ABduction/ADduction: AROM;Both;10 reps;Other (comment)(also educated sitting 90/90 with t band) Straight Leg Raises: Strengthening;Both;10 reps Hip Flexion/Marching:  AROM;Both;10 reps Other Exercises Other Exercises: Educated/performed scap retr, horizontal abd on diagonal B ways, chest press,    General Comments        Pertinent Vitals/Pain Pain Assessment: Faces Faces Pain Scale: Hurts little more Pain Location: Rib/flank pain Pain Descriptors / Indicators: Sore Pain Intervention(s): Monitored during session    Home Living                       Prior Function            PT Goals (current goals can now be found in the care plan section) Progress towards PT goals: Progressing toward goals    Frequency    7X/week      PT Plan Current plan remains appropriate    Co-evaluation              AM-PAC PT "6 Clicks" Mobility   Outcome Measure  Help needed turning from your back to your side while in a flat bed without using bedrails?: A Little Help needed moving from lying on your back to sitting on the side of a flat bed without using bedrails?: A Little Help needed moving to and from a bed to a chair (including a wheelchair)?: A Little Help needed standing up from a chair using your arms (e.g., wheelchair or bedside chair)?: A Little Help needed to walk in hospital room?: A Little Help needed climbing 3-5 steps with a railing? : A Lot 6 Click Score: 17    End of Session Equipment Utilized During Treatment: Gait belt Activity Tolerance: Patient tolerated treatment well Patient left: in chair;with call bell/phone within reach;with chair alarm set Nurse Communication: Other (comment)(SW regarding session) PT Visit Diagnosis: Unsteadiness on feet (R26.81);Muscle weakness (generalized) (M62.81);History of falling (Z91.81)     Time: 1524-1600 PT Time Calculation (min) (ACUTE ONLY): 36 min  Charges:  $Gait Training: 8-22 mins $Therapeutic Exercise: 8-22 mins                      Larae Grooms, PTA 12/31/2018, 4:26 PM

## 2018-12-31 NOTE — Progress Notes (Signed)
Pt. suctionned for small amount thick tan secretions. Pt. Tolerated all procedures well.

## 2019-01-02 ENCOUNTER — Inpatient Hospital Stay: Payer: Medicare Other | Admitting: Physician Assistant

## 2019-01-05 ENCOUNTER — Telehealth: Payer: Self-pay | Admitting: *Deleted

## 2019-01-05 NOTE — Telephone Encounter (Signed)
Angela David called the physical therapist for [facility] called and wanted to call her back for a verbal order if its okay to do PT 1 week 1, 2 week 3, 1 week 3    Also to have an [place] therapy order to access safety and potential ADLs.    Please call Angela David back at [phone]

## 2019-01-05 NOTE — Telephone Encounter (Signed)
Message left for Angela David in agreement with physical therapy orders and also for Occupational  therapy. She may call back with any questions.

## 2019-01-06 ENCOUNTER — Inpatient Hospital Stay: Payer: Medicare Other | Admitting: Physician Assistant

## 2019-01-07 ENCOUNTER — Other Ambulatory Visit: Payer: Self-pay

## 2019-01-07 ENCOUNTER — Ambulatory Visit (INDEPENDENT_AMBULATORY_CARE_PROVIDER_SITE_OTHER): Payer: Medicare Other | Admitting: Surgery

## 2019-01-07 ENCOUNTER — Ambulatory Visit
Admission: RE | Admit: 2019-01-07 | Discharge: 2019-01-07 | Disposition: A | Discharge status: Home / Self Care | Payer: Medicare Other | Attending: Physician Assistant | Admitting: Physician Assistant

## 2019-01-07 ENCOUNTER — Ambulatory Visit
Admission: RE | Admit: 2019-01-07 | Discharge: 2019-01-07 | Disposition: A | Discharge status: Home / Self Care | Payer: Medicare Other | Source: Ambulatory Visit | Attending: Physician Assistant | Admitting: Physician Assistant

## 2019-01-07 ENCOUNTER — Encounter: Payer: Self-pay | Admitting: Surgery

## 2019-01-07 VITALS — BP 121/87 | HR 85 | Temp 95.7°F | Ht 64.0 in | Wt 147.2 lb

## 2019-01-07 DIAGNOSIS — S2241XA Multiple fractures of ribs, right side, initial encounter for closed fracture: Secondary | ICD-10-CM

## 2019-01-07 MED ORDER — CYCLOBENZAPRINE HCL 5 MG PO TABS: 5.0000 mg | ORAL_TABLET | Freq: Three times a day (TID) | ORAL | 0 refills | Status: DC

## 2019-01-07 NOTE — Patient Instructions (Signed)
Dr.Pabon has sent over a prescription for Flexeril 5 mg tablet to take three times a day by mouth to your local pharmacy.   Patient is to follow up with Dr.Pabon in three weeks scheduled for December 2nd at 3:00pm.

## 2019-01-09 NOTE — Progress Notes (Signed)
Outpatient Surgical Follow Up  01/09/2019  Angela David is an 78 y.o. female.   Chief Complaint  Patient presents with  . Follow-up     fall, right rib fx, apical PTX....Angela Kitchenneeds CXR prior to appointment    HPI: Angela David is a 78 year old female well-known to me status post fall and 6 rib fractures in the right side with a small pneumothorax on the right that was managed without chest tube.  She comes now for follow-up.  Chest x-ray personally reviewed showing no evidence of pneumothorax.  There is significant atelectasis within the right side.  She does have a tracheostomy and is doing very well.  She continues to endorse pain that is moderate in intensity intermittent and worsening with deep breaths.  No fevers no chills and no changes in respiratory status  Past Medical History:  Diagnosis Date  . Breast cancer ([place]) 2002   right breast chemo and radiation  . Cancer [facility]) 2002   right breast  . Diabetes mellitus without complication ([place])   . Diabetic peripheral neuropathy ([place]) 08/28/2016  . GERD (gastroesophageal reflux disease)   . Glaucoma   . High cholesterol   . History of colon polyps   . Peripheral neuropathy 08/28/2016  . Personal history of chemotherapy 2002   BREAST CA  . Personal history of malignant neoplasm of breast   . Personal history of radiation therapy 2002   BREAST CA  . Vocal cord paralysis 2012    Past Surgical History:  Procedure Laterality Date  . ABDOMINAL HYSTERECTOMY    . BREAST BIOPSY Right 2008   neg  . BREAST EXCISIONAL BIOPSY Right 2002   positive  . BREAST LUMPECTOMY Right 2002   chemo and radiation  . BREAST SURGERY Right 2002   LUMPECTOMY, radiation, chemotherapy  . CHOLECYSTECTOMY N/A 12/10/2017   Procedure: LAPAROSCOPIC CHOLECYSTECTOMY;  Surgeon: Benjamine Sprague, DO;  Location: [facility] ORS;  Service: General;  Laterality: N/A;  . COLON RESECTION Right 11/06/2018   Procedure: HAND ASSISTED LAPAROSCOPIC COLON RESECTION, RIGHT;  Surgeon:  Jules Husbands, MD;  Location: [facility] ORS;  Service: General;  Laterality: Right;  . COLONOSCOPY  2009,12/30/2013  . COLONOSCOPY WITH PROPOFOL N/A 04/16/2018   Tubulovillous polyp proximal transverse colon, large sessile polyp, proximal transverse colon;  Surgeon: Robert Bellow, MD;  Location: [facility] ENDOSCOPY;  Service: Endoscopy;  Laterality: N/A;  . ESOPHAGOGASTRODUODENOSCOPY (EGD) WITH PROPOFOL N/A 10/14/2018   Procedure: ESOPHAGOGASTRODUODENOSCOPY (EGD) WITH PROPOFOL;  Surgeon: Lucilla Lame, MD;  Location: [facility] ENDOSCOPY;  Service: Endoscopy;  Laterality: N/A;  . HYDROCELE EXCISION / REPAIR    . TRACHEAL SURGERY  2013  . TRACHEOSTOMY N/A    Dr Kathyrn Sheriff    Family History  Problem Relation Age of Onset  . Cancer Father        bone cancer  . Cancer Sister        lymphoma  . Cancer Brother   . Breast cancer Neg Hx     Social History:  reports that she has never smoked. She has never used smokeless tobacco. She reports that she does not drink alcohol or use drugs.  Allergies:  Allergies  Allergen Reactions  . Shellfish Allergy Shortness Of Breath    respitatory distress   . Sodium Sulfite Shortness Of Breath    Severe Congestion that creates inability to breath  . Codeine Other (See Comments)    Per patient "it kept her up all night"    Medications reviewed.    ROS Full  ROS performed and is otherwise negative other than what is stated in HPI   BP 121/87   Pulse 85   Temp (!) 95.7 F (35.4 C) (Temporal)   Ht 5\' 4"  (1.626 m)   Wt 147 lb 3.2 oz (66.8 kg)   SpO2 97%   BMI 25.27 kg/m   Physical Exam Vitals signs and nursing note reviewed. Exam conducted with a chaperone present.  Constitutional:      General: She is not in acute distress.    Appearance: Normal appearance. She is normal weight.  Eyes:     General: No scleral icterus.       Right eye: No discharge.        Left eye: No discharge.     Conjunctiva/sclera: Conjunctivae normal.  Neck:      Musculoskeletal: Normal range of motion. No neck rigidity.     Comments: TRACH IN PLACE Cardiovascular:     Rate and Rhythm: Normal rate and regular rhythm.  Pulmonary:     Effort: Pulmonary effort is normal. No respiratory distress.     Breath sounds: No stridor. No rhonchi.  Chest:     Chest wall: Tenderness present.  Abdominal:     General: There is no distension.     Palpations: There is no mass.     Tenderness: There is no abdominal tenderness. There is no guarding.  Musculoskeletal: Normal range of motion.  Lymphadenopathy:     Cervical: No cervical adenopathy.  Skin:    General: Skin is warm and dry.     Capillary Refill: Capillary refill takes less than 2 seconds.  Neurological:     General: No focal deficit present.     Mental Status: She is alert and oriented to person, place, and time.  Psychiatric:        Mood and Affect: Mood normal.        Behavior: Behavior normal.        Thought Content: Thought content normal.        Judgment: Judgment normal.        Assessment/Plan: 78 year old female status post fall multiple right-sided rib fractures with resolved pneumothorax.  She continues to endorse pain and we will add Flexeril to her regimen.  I will see her back next 2-3 weekS with chest x-ray.  No need for chest tube or any surgical intervention at this time  Greater than 50% of the 25 minutes  visit was spent in counseling/coordination of care   Caroleen Hamman, MD [place] Sur[facility]

## 2019-01-10 IMAGING — MG 2D DIGITAL SCREENING BILATERAL MAMMOGRAM WITH CAD AND ADJUNCT TO
9 of 15 series · 9 of 31 positions shown · non-contrast
Comparison: Previous exam(s).

CLINICAL DATA: Screening.

EXAM:
2D DIGITAL SCREENING BILATERAL MAMMOGRAM WITH CAD AND ADJUNCT TOMO

[L CV]
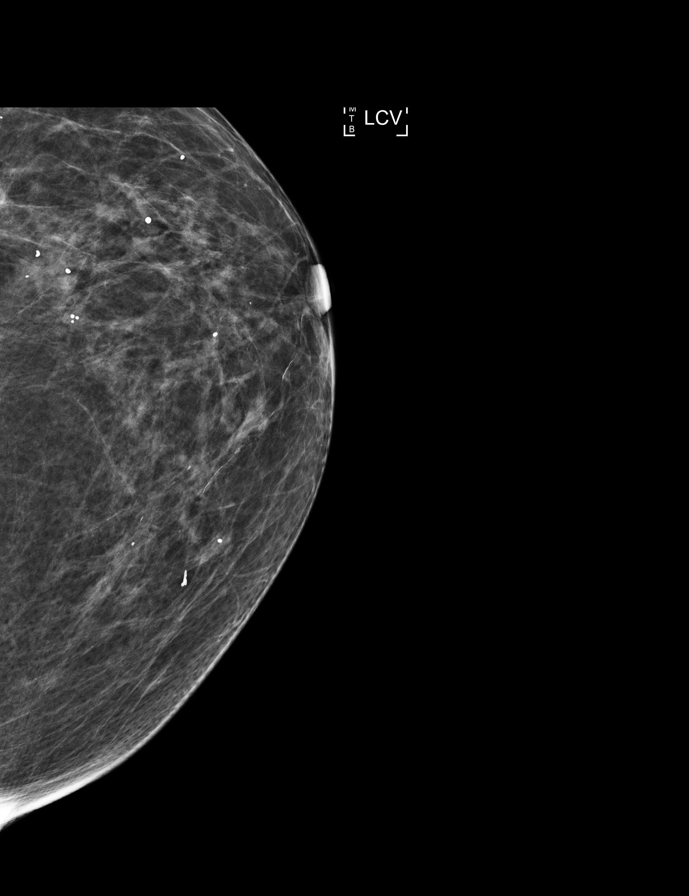

[R MLO (1 of 2)]
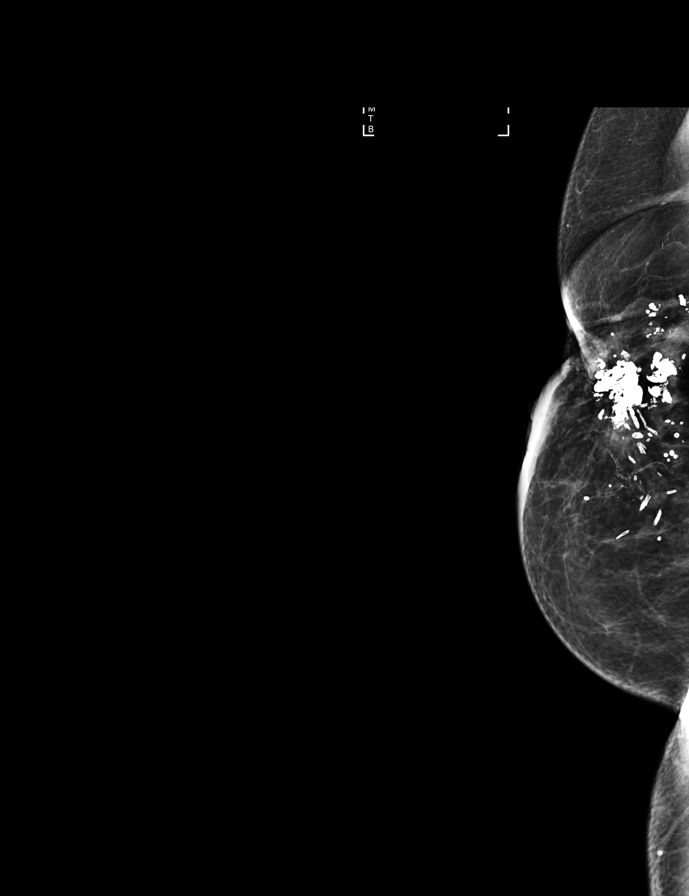

[R CC (1 of 2)]
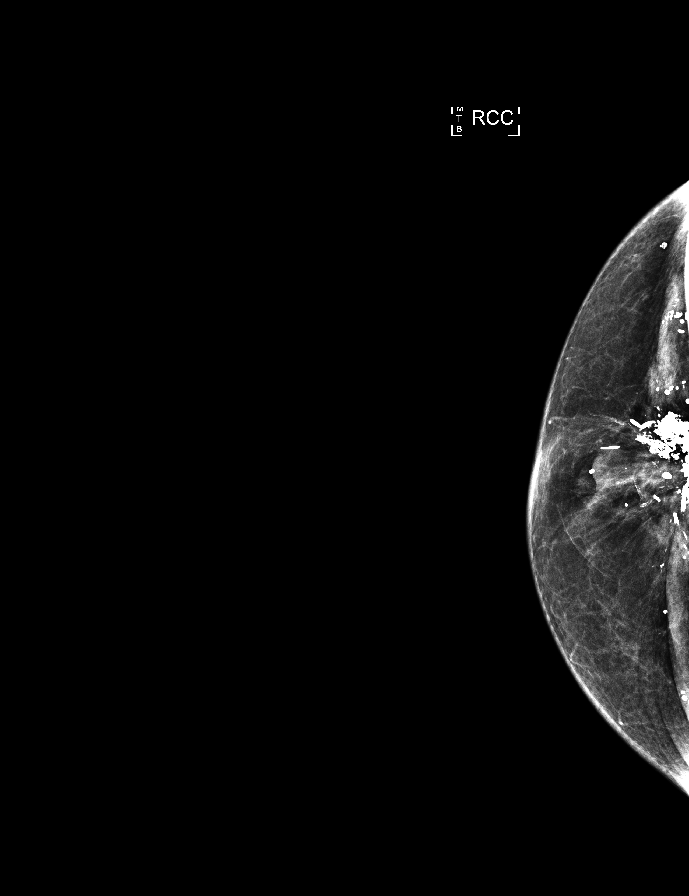

[L MLO synth-2D]
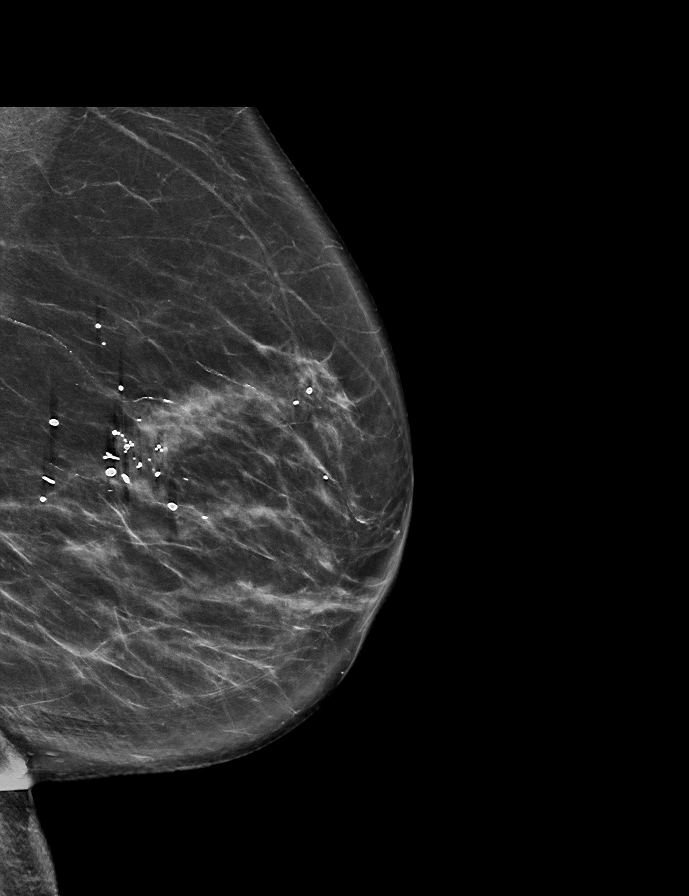

[R CC synth-2D]
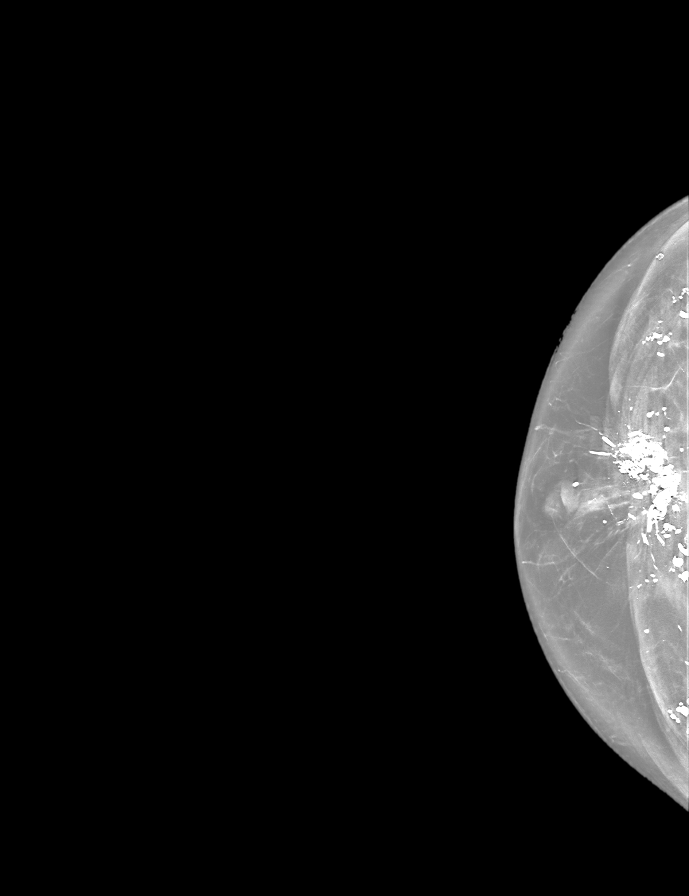

[R CC (2 of 2)]
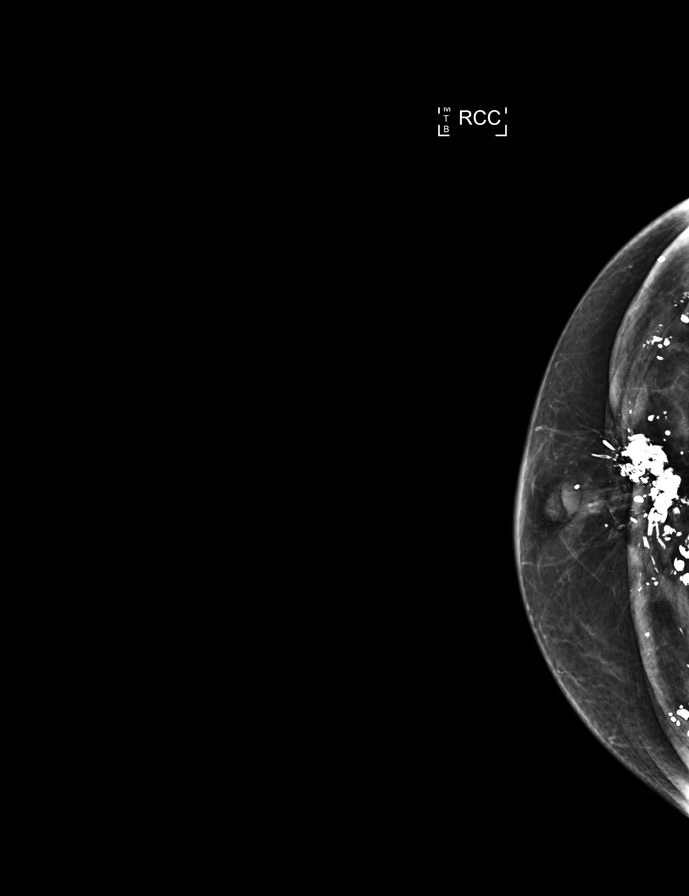

[R MLO (2 of 2)]
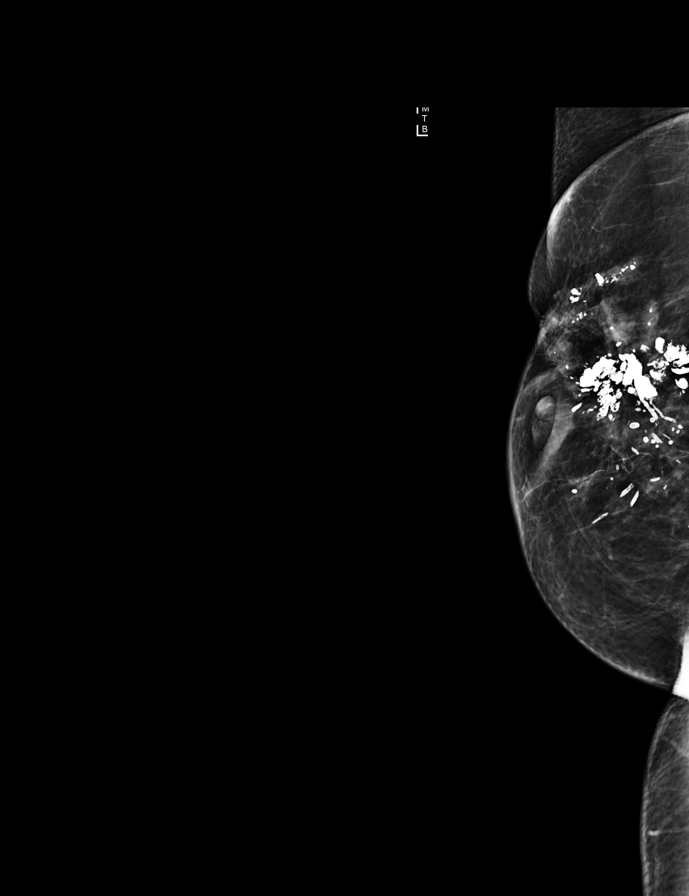

[L MLO]
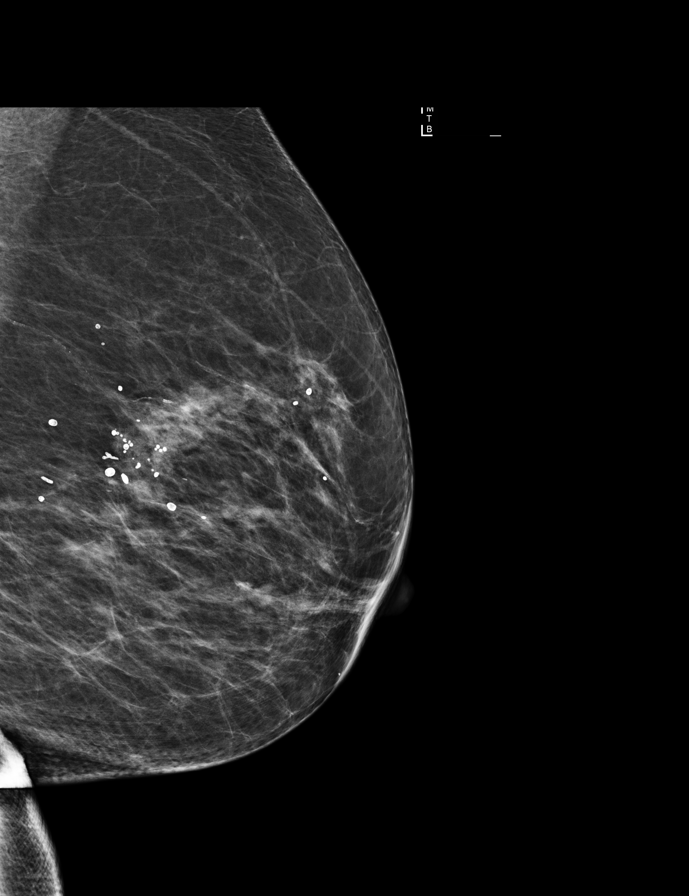

[L CC synth-2D]
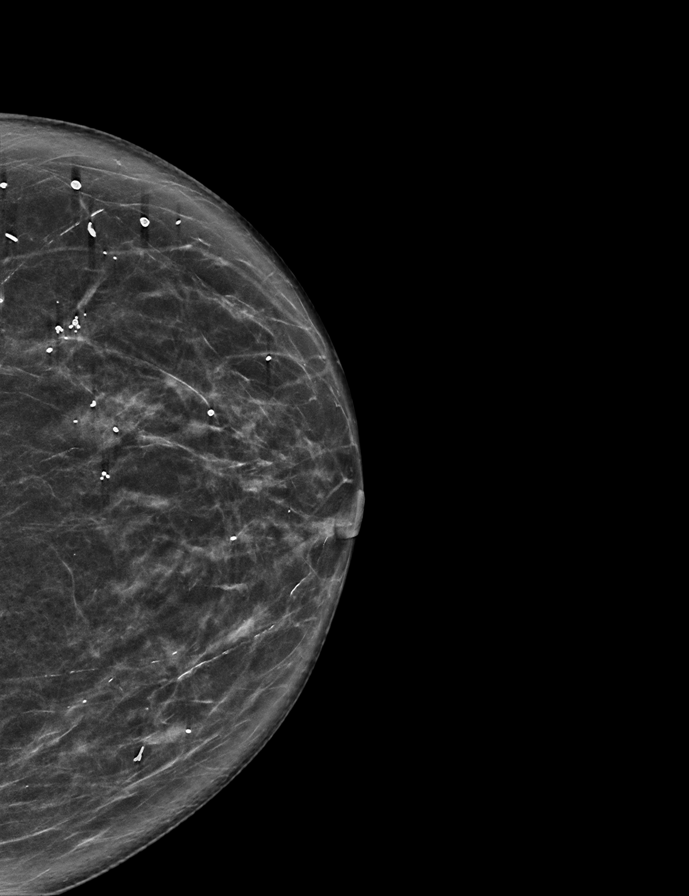

[9 of 31 positions shown; findings below may reference images not displayed]

ACR Breast Density Category b: There are scattered areas of
fibroglandular density.
FINDINGS: There are no findings suspicious for malignancy. Images were
processed with CAD.
IMPRESSION: No mammographic evidence of malignancy. A result letter of this
screening mammogram will be mailed directly to the patient.

RECOMMENDATION:
Screening mammogram in one year. (Code:97-6-RS4)

BI-RADS CATEGORY  1: Negative.

## 2019-02-02 ENCOUNTER — Ambulatory Visit: Payer: Self-pay | Admitting: Surgery

## 2019-02-03 ENCOUNTER — Ambulatory Visit (INDEPENDENT_AMBULATORY_CARE_PROVIDER_SITE_OTHER): Payer: Medicare Other | Admitting: Physician Assistant

## 2019-02-03 ENCOUNTER — Ambulatory Visit
Admission: RE | Admit: 2019-02-03 | Discharge: 2019-02-03 | Disposition: A | Discharge status: Home / Self Care | Payer: Medicare Other | Attending: Surgery | Admitting: Surgery

## 2019-02-03 ENCOUNTER — Other Ambulatory Visit: Payer: Self-pay

## 2019-02-03 ENCOUNTER — Encounter: Payer: Self-pay | Admitting: Physician Assistant

## 2019-02-03 ENCOUNTER — Ambulatory Visit
Admission: RE | Admit: 2019-02-03 | Discharge: 2019-02-03 | Disposition: A | Discharge status: Home / Self Care | Payer: Medicare Other | Source: Ambulatory Visit | Attending: Surgery | Admitting: Surgery

## 2019-02-03 VITALS — BP 128/65 | HR 95 | Temp 97.7°F | Ht 64.0 in | Wt 152.8 lb

## 2019-02-03 DIAGNOSIS — S2241XA Multiple fractures of ribs, right side, initial encounter for closed fracture: Secondary | ICD-10-CM | POA: Diagnosis not present

## 2019-02-03 NOTE — Patient Instructions (Addendum)
Patient may continue to take over the counter Ibuprofen (Motrin) to help with the pain and discomfort.  If you have any questions or concerns, please feel free to contact our office.   Rib Fracture  A rib fracture is a break or crack in one of the bones of the ribs. The ribs are like a cage that goes around your upper chest. A broken or cracked rib is often painful, but most do not cause other problems. Most rib fractures usually heal on their own in 1-3 months. Follow these instructions at home: Managing pain, stiffness, and swelling  If directed, apply ice to the injured area. ? Put ice in a plastic bag. ? Place a towel between your skin and the bag. ? Leave the ice on for 20 minutes, 2-3 times a day.  Take over-the-counter and prescription medicines only as told by your doctor. Activity  Avoid activities that cause pain to the injured area. Protect your injured area.  Slowly increase activity as told by your doctor. General instructions  Do deep breathing as told by your doctor. You may be told to: ? Take deep breaths many times a day. ? Cough many times a day while hugging a pillow. ? Use a device (incentive spirometer) to do deep breathing many times a day.  Drink enough fluid to keep your pee (urine) clear or pale yellow.  Do not wear a rib belt or binder. These do not allow you to breathe deeply.  Keep all follow-up visits as told by your doctor. This is important. Contact a doctor if:  You have a fever. Get help right away if:  You have trouble breathing.  You are short of breath.  You cannot stop coughing.  You cough up thick or bloody spit (sputum).  You feel sick to your stomach (nauseous), throw up (vomit), or have belly (abdominal) pain.  Your pain gets worse and medicine does not help. Summary  A rib fracture is a break or crack in one of the bones of the ribs.  Apply ice to the injured area and take medicines for pain as told by your doctor.  Take  deep breaths and cough many times a day. Hug a pillow every time you cough. This information is not intended to replace advice given to you by your health care provider. Make sure you discuss any questions you have with your health care provider. Document Released: 11/22/2007 Document Revised: 01/25/2017 Document Reviewed: 05/15/2016 Elsevier Patient Education  2020 [employer].

## 2019-02-03 NOTE — Progress Notes (Signed)
[facility] SUR[facility] CLINIC NOTE  02/03/2019  History of Present Illness: Angela David is a 78 y.o. female admitted to [facility] from 10/30 - 11/04 for mechanical fall resulting in multiple right sided rib fractures and small PTX which did not require chest tube placement.  Today, she reports that she continues to get better. She is having some right shoulder pain. This occurs almost daily. Well controlled with 600 mg of Ibuprofen. She tried Flexeril as well but stopped secondary to nausea. She otherwise has no complaints. No fever, chills, nausea, emesis, CP, or SOB. Overall doing well.    Past Medical History: Past Medical History:  Diagnosis Date  . Breast cancer ([place]) 2002   right breast chemo and radiation  . Cancer [facility]) 2002   right breast  . Diabetes mellitus without complication ([place])   . Diabetic peripheral neuropathy ([place]) 08/28/2016  . GERD (gastroesophageal reflux disease)   . Glaucoma   . High cholesterol   . History of colon polyps   . Peripheral neuropathy 08/28/2016  . Personal history of chemotherapy 2002   BREAST CA  . Personal history of malignant neoplasm of breast   . Personal history of radiation therapy 2002   BREAST CA  . Vocal cord paralysis 2012     Past Surgical History: Past Surgical History:  Procedure Laterality Date  . ABDOMINAL HYSTERECTOMY    . BREAST BIOPSY Right 2008   neg  . BREAST EXCISIONAL BIOPSY Right 2002   positive  . BREAST LUMPECTOMY Right 2002   chemo and radiation  . BREAST SURGERY Right 2002   LUMPECTOMY, radiation, chemotherapy  . CHOLECYSTECTOMY N/A 12/10/2017   Procedure: LAPAROSCOPIC CHOLECYSTECTOMY;  Surgeon: Benjamine Sprague, DO;  Location: [facility] ORS;  Service: General;  Laterality: N/A;  . COLON RESECTION Right 11/06/2018   Procedure: HAND ASSISTED LAPAROSCOPIC COLON RESECTION, RIGHT;  Surgeon: Jules Husbands, MD;  Location: [facility] ORS;  Service: General;  Laterality: Right;  . COLONOSCOPY  2009,12/30/2013   . COLONOSCOPY WITH PROPOFOL N/A 04/16/2018   Tubulovillous polyp proximal transverse colon, large sessile polyp, proximal transverse colon;  Surgeon: Robert Bellow, MD;  Location: [facility] ENDOSCOPY;  Service: Endoscopy;  Laterality: N/A;  . ESOPHAGOGASTRODUODENOSCOPY (EGD) WITH PROPOFOL N/A 10/14/2018   Procedure: ESOPHAGOGASTRODUODENOSCOPY (EGD) WITH PROPOFOL;  Surgeon: Lucilla Lame, MD;  Location: [facility] ENDOSCOPY;  Service: Endoscopy;  Laterality: N/A;  . HYDROCELE EXCISION / REPAIR    . TRACHEAL SURGERY  2013  . TRACHEOSTOMY N/A    Dr Kathyrn Sheriff    Home Medications: Prior to Admission medications   Medication Sig Start Date End Date Taking? Authorizing Provider  acetaminophen (TYLENOL) 500 MG tablet Take 500 mg by mouth daily.     [provider]  Biotin 1000 MCG tablet Take 1,000 mcg by mouth daily.     [provider]  busPIRone (BUSPAR) 15 MG tablet Take 30 mg by mouth 2 (two) times daily.     [provider]  cyclobenzaprine (FLEXERIL) 5 MG tablet Take 1 tablet (5 mg total) by mouth 3 (three) times daily. 01/07/19   Pabon, Diego F, MD  escitalopram (LEXAPRO) 10 MG tablet Take 10 mg by mouth daily.  01/13/15   [provider]  escitalopram (LEXAPRO) 20 MG tablet Take 20 mg by mouth daily.    [provider]  folic acid (FOLVITE) 1 MG tablet Take 1 mg by mouth daily.    [provider]  gabapentin (NEURONTIN) 600 MG tablet Take 600 mg by  mouth 3 (three) times daily.     [provider]  ibuprofen (ADVIL) 600 MG tablet Take 1 tablet (600 mg total) by mouth every 6 (six) hours as needed for moderate pain. 12/29/18   Tylene Fantasia, PA-C  iron polysaccharides (NIFEREX) 150 MG capsule Take 150 mg by mouth daily.    [provider]  loperamide (IMODIUM A-D) 2 MG tablet Take 1 tablet (2 mg total) by mouth 4 (four) times daily as needed for diarrhea or loose stools. 11/09/18   Pabon, [place], MD  lovastatin (MEVACOR) 20 MG  tablet Take 20 mg by mouth at bedtime.    [provider]  meclizine (ANTIVERT) 25 MG tablet Take 25 mg by mouth 2 (two) times daily as needed for dizziness.     [provider]  Melatonin 5 MG CAPS Take 5 mg by mouth at bedtime.     [provider]  metFORMIN (GLUCOPHAGE) 1000 MG tablet Take 1,000 mg by mouth 2 (two) times daily as needed (for BS >110 in the morning and >150 in the evening).     [provider]  methocarbamol (ROBAXIN) 500 MG tablet Take 1 tablet (500 mg total) by mouth every 8 (eight) hours as needed for muscle spasms. 12/29/18   Tylene Fantasia, PA-C  metoprolol tartrate (LOPRESSOR) 25 MG tablet Take 25 mg by mouth 2 (two) times daily as needed. 12/01/18   [provider]  ondansetron (ZOFRAN) 4 MG tablet Take 1 tablet (4 mg total) by mouth every 8 (eight) hours as needed for nausea or vomiting. 11/09/18   Pabon, Diego F, MD  oxyCODONE (OXY IR/ROXICODONE) 5 MG immediate release tablet Take 1 tablet (5 mg total) by mouth every 6 (six) hours as needed for severe pain. 12/29/18   Tylene Fantasia, PA-C  pantoprazole (PROTONIX) 40 MG tablet Take 40 mg by mouth daily.     [provider]  RESTASIS 0.05 % ophthalmic emulsion Place 1 drop into both eyes 2 (two) times daily.     [provider]  rOPINIRole (REQUIP) 0.5 MG tablet Take 0.5 mg by mouth at bedtime.  06/09/18   [provider]    Allergies: Allergies  Allergen Reactions  . Shellfish Allergy Shortness Of Breath    respitatory distress   . Sodium Sulfite Shortness Of Breath    Severe Congestion that creates inability to breath  . Codeine Other (See Comments)    Per patient "it kept her up all night"    Review of Systems: Review of Systems  Constitutional: Negative for chills and fever.  HENT: Negative for congestion and sore throat.   Respiratory: Negative for cough and wheezing.   Cardiovascular: Negative for chest pain and palpitations.   Gastrointestinal: Negative for abdominal pain, nausea and vomiting.  Musculoskeletal: Positive for back pain, falls (History of) and joint pain (Right Shoulder).  All other systems reviewed and are negative.   Physical Exam There were no vitals taken for this visit. CONSTITUTIONAL: Well appearing female, NAD HEENT:  Normocephalic, atraumatic, extraocular motion intact. Tracheostomy present RESPIRATORY:  Lungs are clear, and breath sounds are equal bilaterally. Normal respiratory effort without pathologic use of accessory muscles. CARDIOVASCULAR: Heart is regular without murmurs, gallops, or rubs. NEUROLOGIC:  Motor and sensation is grossly normal.  Cranial nerves are grossly intact. PSYCH:  Alert and oriented to person, place and time. Affect is normal. SKIN: Warm and dry  Labs/Imaging:  CXR (02/03/2019) personally reviewed which again shows right sided  rib fractures but is without any evidence of pneumothorax   Assessment and Plan: This is a 78 y.o. female s/p mechanical fall resulting in multiple right sided rib fractures and small PTX   - Continue ibuprofen for pain management +/- ice packs  - Encouraged continue pulmonary toilet as possible given tracheostomy  - She would like 1 more follow up appointment; I will see her again in 1 month with CXR prior  Face-to-face time spent with the patient and care providers was 15 minutes, with more than 50% of the time spent counseling, educating, and coordinating care of the patient.     Edison Simon, PA-C [facility] 02/03/2019, 1:54 PM [phone] M-F: 7am - 4pm

## 2019-02-06 ENCOUNTER — Ambulatory Visit
Admission: RE | Admit: 2019-02-06 | Discharge: 2019-02-06 | Disposition: A | Discharge status: Home / Self Care | Payer: Medicare Other | Source: Ambulatory Visit | Attending: Internal Medicine | Admitting: Internal Medicine

## 2019-02-06 ENCOUNTER — Other Ambulatory Visit: Payer: Self-pay | Admitting: Internal Medicine

## 2019-02-06 ENCOUNTER — Other Ambulatory Visit: Payer: Self-pay

## 2019-02-06 DIAGNOSIS — M7989 Other specified soft tissue disorders: Secondary | ICD-10-CM | POA: Insufficient documentation

## 2019-03-02 ENCOUNTER — Ambulatory Visit: Payer: Medicare Other | Admitting: Physician Assistant

## 2019-03-04 ENCOUNTER — Ambulatory Visit (INDEPENDENT_AMBULATORY_CARE_PROVIDER_SITE_OTHER): Payer: Medicare Other | Admitting: Surgery

## 2019-03-04 ENCOUNTER — Encounter: Payer: Self-pay | Admitting: Surgery

## 2019-03-04 ENCOUNTER — Ambulatory Visit
Admission: RE | Admit: 2019-03-04 | Discharge: 2019-03-04 | Disposition: A | Discharge status: Home / Self Care | Payer: Medicare Other | Attending: Physician Assistant | Admitting: Physician Assistant

## 2019-03-04 ENCOUNTER — Other Ambulatory Visit: Payer: Self-pay

## 2019-03-04 ENCOUNTER — Ambulatory Visit
Admission: RE | Admit: 2019-03-04 | Discharge: 2019-03-04 | Disposition: A | Discharge status: Home / Self Care | Payer: Medicare Other | Source: Ambulatory Visit | Attending: Physician Assistant | Admitting: Physician Assistant

## 2019-03-04 VITALS — BP 127/58 | HR 99 | Temp 97.3°F | Resp 14 | Ht 64.0 in | Wt 151.2 lb

## 2019-03-04 DIAGNOSIS — S2241XA Multiple fractures of ribs, right side, initial encounter for closed fracture: Secondary | ICD-10-CM

## 2019-03-04 NOTE — Patient Instructions (Signed)
Please follow up as needed. Call the office if you have any questions or concerns.

## 2019-03-04 NOTE — Progress Notes (Signed)
Outpatient Surgical Follow Up  03/04/2019  Angela David is an 79 y.o. female.   Chief Complaint  Patient presents with  . Follow-up    Fall - right rib fx    HPI: Angela David is a 79 y.o. female admitted to [facility] from 10/30 - 11/04 for mechanical fall resulting in multiple right sided rib fractures and small PTX which did not require chest tube placement. Today, she reports that she still has shoulder and back pain   CXR pers. Reviewed, no PTX healing rib fx, no effusion She otherwise has no complaints. No fever, chills, nausea, emesis, CP, or SOB. Overall doing well.   Past Medical History:  Diagnosis Date  . Breast cancer ([place]) 2002   right breast chemo and radiation  . Cancer [facility]) 2002   right breast  . Diabetes mellitus without complication ([place])   . Diabetic peripheral neuropathy ([place]) 08/28/2016  . GERD (gastroesophageal reflux disease)   . Glaucoma   . High cholesterol   . History of colon polyps   . Peripheral neuropathy 08/28/2016  . Personal history of chemotherapy 2002   BREAST CA  . Personal history of malignant neoplasm of breast   . Personal history of radiation therapy 2002   BREAST CA  . Vocal cord paralysis 2012    Past Surgical History:  Procedure Laterality Date  . ABDOMINAL HYSTERECTOMY    . BREAST BIOPSY Right 2008   neg  . BREAST EXCISIONAL BIOPSY Right 2002   positive  . BREAST LUMPECTOMY Right 2002   chemo and radiation  . BREAST SURGERY Right 2002   LUMPECTOMY, radiation, chemotherapy  . CHOLECYSTECTOMY N/A 12/10/2017   Procedure: LAPAROSCOPIC CHOLECYSTECTOMY;  Surgeon: Benjamine Sprague, DO;  Location: [facility] ORS;  Service: General;  Laterality: N/A;  . COLON RESECTION Right 11/06/2018   Procedure: HAND ASSISTED LAPAROSCOPIC COLON RESECTION, RIGHT;  Surgeon: Jules Husbands, MD;  Location: [facility] ORS;  Service: General;  Laterality: Right;  . COLONOSCOPY  2009,12/30/2013  . COLONOSCOPY WITH PROPOFOL N/A 04/16/2018   Tubulovillous polyp  proximal transverse colon, large sessile polyp, proximal transverse colon;  Surgeon: Robert Bellow, MD;  Location: [facility] ENDOSCOPY;  Service: Endoscopy;  Laterality: N/A;  . ESOPHAGOGASTRODUODENOSCOPY (EGD) WITH PROPOFOL N/A 10/14/2018   Procedure: ESOPHAGOGASTRODUODENOSCOPY (EGD) WITH PROPOFOL;  Surgeon: Lucilla Lame, MD;  Location: [facility] ENDOSCOPY;  Service: Endoscopy;  Laterality: N/A;  . HYDROCELE EXCISION / REPAIR    . TRACHEAL SURGERY  2013  . TRACHEOSTOMY N/A    Dr Kathyrn Sheriff    Family History  Problem Relation Age of Onset  . Cancer Father        bone cancer  . Cancer Sister        lymphoma  . Cancer Brother   . Breast cancer Neg Hx     Social History:  reports that she has never smoked. She has never used smokeless tobacco. She reports that she does not drink alcohol or use drugs.  Allergies:  Allergies  Allergen Reactions  . Shellfish Allergy Shortness Of Breath    respitatory distress   . Sodium Sulfite Shortness Of Breath    Severe Congestion that creates inability to breath  . Codeine Other (See Comments)    Per patient "it kept her up all night"    Medications reviewed.    ROS Full ROS performed and is otherwise negative other than what is stated in HPI   There were no vitals taken for this visit.  Physical Exam Vitals  and nursing note reviewed. Exam conducted with a chaperone present.  Constitutional:      General: She is not in acute distress.    Appearance: Normal appearance. She is normal weight.  Neck:     Comments: Trach in place Cardiovascular:     Rate and Rhythm: Normal rate and regular rhythm.  Pulmonary:     Effort: Pulmonary effort is normal. No respiratory distress.     Breath sounds: Normal breath sounds. No stridor. No wheezing.  Abdominal:     General: Abdomen is flat. There is no distension.     Palpations: There is no mass.     Tenderness: There is no abdominal tenderness.  Musculoskeletal:     Cervical back: Normal range of  motion and neck supple. No rigidity or tenderness.  Lymphadenopathy:     Cervical: No cervical adenopathy.  Skin:    Capillary Refill: Capillary refill takes less than 2 seconds.  Neurological:     General: No focal deficit present.     Mental Status: She is alert and oriented to person, place, and time.  Psychiatric:        Mood and Affect: Mood normal.        Behavior: Behavior normal.        Thought Content: Thought content normal.        Judgment: Judgment normal.       Assessment/Plan: S/p fall rib fx w/o acute complications RTC after mammo Greater than 50% of the 25 minutes  visit was spent in counseling/coordination of care   Caroleen Hamman, MD [place] [facility]

## 2019-03-11 ENCOUNTER — Inpatient Hospital Stay: Payer: Medicare Other | Attending: Hematology and Oncology | Admitting: Hematology and Oncology

## 2019-03-11 ENCOUNTER — Inpatient Hospital Stay: Payer: Medicare Other

## 2019-03-13 ENCOUNTER — Other Ambulatory Visit: Payer: Self-pay | Admitting: Pain Medicine

## 2019-03-13 DIAGNOSIS — M545 Low back pain, unspecified: Secondary | ICD-10-CM

## 2019-03-20 ENCOUNTER — Other Ambulatory Visit: Payer: Self-pay

## 2019-03-20 ENCOUNTER — Emergency Department: Payer: Medicare Other

## 2019-03-20 ENCOUNTER — Encounter: Payer: Self-pay | Admitting: Emergency Medicine

## 2019-03-20 ENCOUNTER — Inpatient Hospital Stay
Admission: EM | Admit: 2019-03-20 | Discharge: 2019-04-01 | DRG: 208 | Disposition: A | Discharge status: Home Health | Payer: Medicare Other | Attending: Internal Medicine | Admitting: Internal Medicine

## 2019-03-20 DIAGNOSIS — Z853 Personal history of malignant neoplasm of breast: Secondary | ICD-10-CM

## 2019-03-20 DIAGNOSIS — R269 Unspecified abnormalities of gait and mobility: Secondary | ICD-10-CM

## 2019-03-20 DIAGNOSIS — J9600 Acute respiratory failure, unspecified whether with hypoxia or hypercapnia: Secondary | ICD-10-CM | POA: Diagnosis not present

## 2019-03-20 DIAGNOSIS — K219 Gastro-esophageal reflux disease without esophagitis: Secondary | ICD-10-CM | POA: Diagnosis present

## 2019-03-20 DIAGNOSIS — M810 Age-related osteoporosis without current pathological fracture: Secondary | ICD-10-CM | POA: Diagnosis present

## 2019-03-20 DIAGNOSIS — R0902 Hypoxemia: Secondary | ICD-10-CM

## 2019-03-20 DIAGNOSIS — R29898 Other symptoms and signs involving the musculoskeletal system: Secondary | ICD-10-CM

## 2019-03-20 DIAGNOSIS — Z7984 Long term (current) use of oral hypoglycemic drugs: Secondary | ICD-10-CM

## 2019-03-20 DIAGNOSIS — R4182 Altered mental status, unspecified: Secondary | ICD-10-CM

## 2019-03-20 DIAGNOSIS — Z882 Allergy status to sulfonamides status: Secondary | ICD-10-CM

## 2019-03-20 DIAGNOSIS — J9621 Acute and chronic respiratory failure with hypoxia: Secondary | ICD-10-CM | POA: Diagnosis not present

## 2019-03-20 DIAGNOSIS — G9340 Encephalopathy, unspecified: Secondary | ICD-10-CM

## 2019-03-20 DIAGNOSIS — Z888 Allergy status to other drugs, medicaments and biological substances status: Secondary | ICD-10-CM

## 2019-03-20 DIAGNOSIS — Z9889 Other specified postprocedural states: Secondary | ICD-10-CM

## 2019-03-20 DIAGNOSIS — I959 Hypotension, unspecified: Secondary | ICD-10-CM

## 2019-03-20 DIAGNOSIS — E872 Acidosis: Secondary | ICD-10-CM | POA: Diagnosis present

## 2019-03-20 DIAGNOSIS — H409 Unspecified glaucoma: Secondary | ICD-10-CM | POA: Diagnosis present

## 2019-03-20 DIAGNOSIS — Z885 Allergy status to narcotic agent status: Secondary | ICD-10-CM

## 2019-03-20 DIAGNOSIS — Z9221 Personal history of antineoplastic chemotherapy: Secondary | ICD-10-CM

## 2019-03-20 DIAGNOSIS — J398 Other specified diseases of upper respiratory tract: Secondary | ICD-10-CM | POA: Diagnosis present

## 2019-03-20 DIAGNOSIS — J15212 Pneumonia due to Methicillin resistant Staphylococcus aureus: Secondary | ICD-10-CM | POA: Diagnosis present

## 2019-03-20 DIAGNOSIS — J9601 Acute respiratory failure with hypoxia: Secondary | ICD-10-CM

## 2019-03-20 DIAGNOSIS — M5412 Radiculopathy, cervical region: Secondary | ICD-10-CM

## 2019-03-20 DIAGNOSIS — Z79899 Other long term (current) drug therapy: Secondary | ICD-10-CM

## 2019-03-20 DIAGNOSIS — Z881 Allergy status to other antibiotic agents status: Secondary | ICD-10-CM

## 2019-03-20 DIAGNOSIS — Z923 Personal history of irradiation: Secondary | ICD-10-CM

## 2019-03-20 DIAGNOSIS — J9622 Acute and chronic respiratory failure with hypercapnia: Secondary | ICD-10-CM | POA: Diagnosis present

## 2019-03-20 DIAGNOSIS — G4733 Obstructive sleep apnea (adult) (pediatric): Secondary | ICD-10-CM | POA: Diagnosis present

## 2019-03-20 DIAGNOSIS — J9602 Acute respiratory failure with hypercapnia: Secondary | ICD-10-CM

## 2019-03-20 DIAGNOSIS — R531 Weakness: Secondary | ICD-10-CM

## 2019-03-20 DIAGNOSIS — J440 Chronic obstructive pulmonary disease with acute lower respiratory infection: Secondary | ICD-10-CM | POA: Diagnosis present

## 2019-03-20 DIAGNOSIS — Z91013 Allergy to seafood: Secondary | ICD-10-CM

## 2019-03-20 DIAGNOSIS — E7800 Pure hypercholesterolemia, unspecified: Secondary | ICD-10-CM | POA: Diagnosis present

## 2019-03-20 DIAGNOSIS — E1142 Type 2 diabetes mellitus with diabetic polyneuropathy: Secondary | ICD-10-CM | POA: Diagnosis present

## 2019-03-20 DIAGNOSIS — J3802 Paralysis of vocal cords and larynx, bilateral: Secondary | ICD-10-CM | POA: Diagnosis present

## 2019-03-20 DIAGNOSIS — Z79890 Hormone replacement therapy (postmenopausal): Secondary | ICD-10-CM

## 2019-03-20 DIAGNOSIS — R2689 Other abnormalities of gait and mobility: Secondary | ICD-10-CM

## 2019-03-20 DIAGNOSIS — Z930 Tracheostomy status: Secondary | ICD-10-CM

## 2019-03-20 DIAGNOSIS — Z20822 Contact with and (suspected) exposure to covid-19: Secondary | ICD-10-CM | POA: Diagnosis present

## 2019-03-20 LAB — CBC WITH DIFFERENTIAL/PLATELET
Abs Immature Granulocytes: 0.06 10*3/uL (ref 0.00–0.07)
Basophils Absolute: 0 10*3/uL (ref 0.0–0.1)
Basophils Relative: 0 %
Eosinophils Absolute: 0 10*3/uL (ref 0.0–0.5)
Eosinophils Relative: 0 %
HCT: 47.1 % — ABNORMAL HIGH (ref 36.0–46.0)
Hemoglobin: 14.2 g/dL (ref 12.0–15.0)
Immature Granulocytes: 1 %
Lymphocytes Relative: 15 %
Lymphs Abs: 2 10*3/uL (ref 0.7–4.0)
MCH: 24.8 pg — ABNORMAL LOW (ref 26.0–34.0)
MCHC: 30.1 g/dL (ref 30.0–36.0)
MCV: 82.2 fL (ref 80.0–100.0)
Monocytes Absolute: 0.5 10*3/uL (ref 0.1–1.0)
Monocytes Relative: 4 %
Neutro Abs: 10.7 10*3/uL — ABNORMAL HIGH (ref 1.7–7.7)
Neutrophils Relative %: 80 %
Platelets: 259 10*3/uL (ref 150–400)
RBC: 5.73 MIL/uL — ABNORMAL HIGH (ref 3.87–5.11)
RDW: 13.6 % (ref 11.5–15.5)
WBC: 13.3 10*3/uL — ABNORMAL HIGH (ref 4.0–10.5)
nRBC: 0 % (ref 0.0–0.2)

## 2019-03-20 LAB — BLOOD GAS, VENOUS
Acid-Base Excess: 3 mmol/L — ABNORMAL HIGH (ref 0.0–2.0)
Acid-base deficit: 0.3 mmol/L (ref 0.0–2.0)
Acid-base deficit: 19.7 mmol/L — ABNORMAL HIGH (ref 0.0–2.0)
Bicarbonate: 31 mmol/L — ABNORMAL HIGH (ref 20.0–28.0)
Bicarbonate: 33.1 mmol/L — ABNORMAL HIGH (ref 20.0–28.0)
Bicarbonate: 5.9 mmol/L — ABNORMAL LOW (ref 20.0–28.0)
FIO2: 0.6
O2 Saturation: 85.3 %
O2 Saturation: 99.6 %
O2 Saturation: 96.6 %
Patient temperature: 37
Patient temperature: 37
Patient temperature: 37
pCO2, Ven: 79 mmHg (ref 44.0–60.0)
pCO2, Ven: 87 mmHg (ref 44.0–60.0)
pCO2, Ven: <19 mmHg — CL (ref 44.0–60.0)
pH, Ven: 7.16 — CL (ref 7.250–7.430)
pH, Ven: 7.23 — ABNORMAL LOW (ref 7.250–7.430)
pH, Ven: 7.2 — ABNORMAL LOW (ref 7.250–7.430)
pO2, Ven: 219 mmHg — ABNORMAL HIGH (ref 32.0–45.0)
pO2, Ven: 60 mmHg — ABNORMAL HIGH (ref 32.0–45.0)
pO2, Ven: 105 mmHg — ABNORMAL HIGH (ref 32.0–45.0)

## 2019-03-20 LAB — COMPREHENSIVE METABOLIC PANEL
ALT: 19 U/L (ref 0–44)
AST: 24 U/L (ref 15–41)
Albumin: 3.8 g/dL (ref 3.5–5.0)
Alkaline Phosphatase: 87 U/L (ref 38–126)
Anion gap: 8 (ref 5–15)
BUN: 16 mg/dL (ref 8–23)
CO2: 30 mmol/L (ref 22–32)
Calcium: 10 mg/dL (ref 8.9–10.3)
Chloride: 101 mmol/L (ref 98–111)
Creatinine, Ser: 0.74 mg/dL (ref 0.44–1.00)
GFR calc Af Amer: >60 mL/min (ref >60)
GFR calc non Af Amer: >60 mL/min (ref >60)
Glucose, Bld: 168 mg/dL — ABNORMAL HIGH (ref 70–99)
Potassium: 4.2 mmol/L (ref 3.5–5.1)
Sodium: 139 mmol/L (ref 135–145)
Total Bilirubin: 0.7 mg/dL (ref 0.3–1.2)
Total Protein: 8.7 g/dL — ABNORMAL HIGH (ref 6.5–8.1)

## 2019-03-20 LAB — LACTIC ACID, PLASMA: Lactic Acid, Venous: 0.9 mmol/L (ref 0.5–1.9)

## 2019-03-20 LAB — BLOOD GAS, ARTERIAL
Acid-base deficit: 1.4 mmol/L (ref 0.0–2.0)
Bicarbonate: 26.6 mmol/L (ref 20.0–28.0)
FIO2: 0.4
MECHVT: 420 mL
O2 Saturation: 96.6 %
PEEP: 5 cmH2O
Patient temperature: 37
RATE: 20 resp/min
pCO2 arterial: 58 mmHg — ABNORMAL HIGH (ref 32.0–48.0)
pH, Arterial: 7.27 — ABNORMAL LOW (ref 7.350–7.450)
pO2, Arterial: 98 mmHg (ref 83.0–108.0)

## 2019-03-20 LAB — RESPIRATORY PANEL BY RT PCR (FLU A&B, COVID)
Influenza A by PCR: NEGATIVE
Influenza B by PCR: NEGATIVE
SARS Coronavirus 2 by RT PCR: NEGATIVE

## 2019-03-20 LAB — TROPONIN I (HIGH SENSITIVITY): Troponin I (High Sensitivity): 9 ng/L (ref <18)

## 2019-03-20 LAB — APTT: aPTT: 30 seconds (ref 24–36)

## 2019-03-20 LAB — PROTIME-INR
INR: 1.1 (ref 0.8–1.2)
Prothrombin Time: 13.8 seconds (ref 11.4–15.2)

## 2019-03-20 MED ORDER — SODIUM CHLORIDE 0.9 % IV SOLN: Freq: Once | INTRAVENOUS | Status: AC

## 2019-03-20 MED ORDER — VANCOMYCIN HCL IN DEXTROSE 1-5 GM/200ML-% IV SOLN
1000.0000 mg | Freq: Once | INTRAVENOUS | Status: AC
  Administered 2019-03-21: 01:00:00 1000 mg via INTRAVENOUS
  Filled 2019-03-20: qty 200

## 2019-03-20 MED ORDER — SODIUM CHLORIDE 0.9 % IV SOLN
1.0000 g | Freq: Once | INTRAVENOUS | Status: AC
  Administered 2019-03-21: 1 g via INTRAVENOUS
  Filled 2019-03-20: qty 1

## 2019-03-20 MED ORDER — IOHEXOL 350 MG/ML SOLN
75.0000 mL | Freq: Once | INTRAVENOUS | Status: AC | PRN
  Administered 2019-03-20: 75 mL via INTRAVENOUS

## 2019-03-20 NOTE — ED Triage Notes (Signed)
Pt arrived via [facility] from home reporting increased generalized weakness throughout the day today. [facility] reports that pt was 87% on RA and was 92% on 2L nonrebreather through pt trache. Pt reports that she isn't experiencing SOB.

## 2019-03-20 NOTE — ED Notes (Signed)
Pt opened her eyes spontaneously and nods yes or no to questions. Pt repositioned for comfort and provided additional blanket.

## 2019-03-20 NOTE — ED Notes (Signed)
Pt returned to room 19 from Ct scan

## 2019-03-20 NOTE — Progress Notes (Signed)
Per MD, previous VBG resulted at 22:41 indicated erroneous results in comparison to the VBG obtained at 20:47. Follow up ABG was obtained with no critical values.

## 2019-03-20 NOTE — ED Provider Notes (Signed)
[facility] Emergency Department Provider Note    ____________________________________________   I have reviewed the triage vital signs and the nursing notes.   HISTORY  Chief Complaint Weakness   History limited by: Patient able to nod yes/no    HPI Angela David is a 79 y.o. female who presents to the emergency department today via EMS today because of concerns for weakness.  Apparently the weakness started today. The patient does have a tracheostomy but is not on any baseline oxygen. The patient denies any chest pain. Denies any fevers. Does feel like she needs to cough. Per report the husband had been suctioning out her tracheostomy today.    Records reviewed. Per medical record review patient has a history of tracheostomy. Breast cancer.   Past Medical History:  Diagnosis Date  . Breast cancer ([place]) 2002   right breast chemo and radiation  . Cancer [facility]) 2002   right breast  . Diabetes mellitus without complication ([place])   . Diabetic peripheral neuropathy ([place]) 08/28/2016  . GERD (gastroesophageal reflux disease)   . Glaucoma   . High cholesterol   . History of colon polyps   . Peripheral neuropathy 08/28/2016  . Personal history of chemotherapy 2002   BREAST CA  . Personal history of malignant neoplasm of breast   . Personal history of radiation therapy 2002   BREAST CA  . Vocal cord paralysis 2012    Patient Active Problem List   Diagnosis Date Noted  . Multiple fractures of ribs, right side, initial encounter for closed fracture 12/25/2018  . Colon polyp 11/06/2018  . Esophageal dysphagia   . 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 Stricture and stenosis of esophagus   . Elevated CEA 09/02/2018  . Goals of care, counseling/discussion 09/02/2018  . Polyp of transverse colon 09/02/2018  . Foreign body in right foot 05/26/2018  . History of tracheostomy 04/30/2018  . Moderate episode of recurrent major depressive disorder ([place])  04/03/2018  . Poor balance 04/03/2018  . Shoulder lesion, right 02/28/2017  . Cervical radiculopathy 09/28/2016  . Peripheral neuropathy 08/28/2016  . Diabetic peripheral neuropathy ([place]) 08/28/2016  . Leg weakness, bilateral 08/13/2016  . Gait abnormality 08/13/2016  . Carcinoma of overlapping sites of right breast in female, estrogen receptor negative ([place]) 12/27/2015  . Malignant neoplasm of breast ([place]) 03/28/2015  . 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 Hypercholesterolemia 03/28/2015  . Recurrent major depressive disorder, in partial remission ([place]) 03/01/2015  . 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 Cephalalgia 09/15/2014  . Headache due to trauma 09/15/2014  . Osteopenia 01/07/2014  . Age-related osteoporosis without current pathological fracture 01/07/2014  . History of colonic polyps 12/14/2013  . History of breast cancer 11/26/2012  . H/O malignant neoplasm of breast 11/26/2012  . 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 Calcification of bronchus or trachea 01/16/2011  . 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 Anxiety state 12/19/2010  . Blood in sputum 12/19/2010  . HLD (hyperlipidemia) 12/19/2010  . Type 2 diabetes mellitus ([place]) 12/19/2010    Past Surgical History:  Procedure Laterality Date  . ABDOMINAL HYSTERECTOMY    . BREAST BIOPSY Right 2008   neg  . BREAST EXCISIONAL BIOPSY Right 2002   positive  . BREAST LUMPECTOMY Right 2002   chemo and radiation  . BREAST SURGERY Right 2002   LUMPECTOMY, radiation, chemotherapy  . CHOLECYSTECTOMY N/A 12/10/2017   Procedure: LAPAROSCOPIC CHOLECYSTECTOMY;  Surgeon: Lysle Pearl,  Isami, DO;  Location: [facility] ORS;  Service: General;  Laterality: N/A;  . COLON RESECTION Right 11/06/2018   Procedure: HAND ASSISTED LAPAROSCOPIC COLON RESECTION, RIGHT;  Surgeon: Jules Husbands, MD;  Location: [facility] ORS;  Service: General;  Laterality: Right;  . COLONOSCOPY   2009,12/30/2013  . COLONOSCOPY WITH PROPOFOL N/A 04/16/2018   Tubulovillous polyp proximal transverse colon, large sessile polyp, proximal transverse colon;  Surgeon: Robert Bellow, MD;  Location: [facility] ENDOSCOPY;  Service: Endoscopy;  Laterality: N/A;  . ESOPHAGOGASTRODUODENOSCOPY (EGD) WITH PROPOFOL N/A 10/14/2018   Procedure: ESOPHAGOGASTRODUODENOSCOPY (EGD) WITH PROPOFOL;  Surgeon: Lucilla Lame, MD;  Location: [facility] ENDOSCOPY;  Service: Endoscopy;  Laterality: N/A;  . HYDROCELE EXCISION / REPAIR    . TRACHEAL SURGERY  2013  . TRACHEOSTOMY N/A    Dr Kathyrn Sheriff    Prior to Admission medications   Medication Sig Start Date End Date Taking? Authorizing Provider  acetaminophen (TYLENOL) 500 MG tablet Take 500 mg by mouth daily.     [provider]  Biotin 1000 MCG tablet Take 1,000 mcg by mouth daily.     [provider]  busPIRone (BUSPAR) 15 MG tablet Take 30 mg by mouth 2 (two) times daily.     [provider]  Calcium Carbonate-Vitamin D 600-400 MG-UNIT tablet Take by mouth.    [provider]  escitalopram (LEXAPRO) 10 MG tablet Take 10 mg by mouth daily.  01/13/15   [provider]  escitalopram (LEXAPRO) 20 MG tablet Take 20 mg by mouth daily.    [provider]  folic acid (FOLVITE) 1 MG tablet Take 1 mg by mouth daily.    [provider]  gabapentin (NEURONTIN) 600 MG tablet Take 600 mg by mouth 3 (three) times daily.     [provider]  iron polysaccharides (FERREX 150) 150 MG capsule Take by mouth. 04/30/18 04/30/19  [provider]  lovastatin (MEVACOR) 20 MG tablet Take 20 mg by mouth at bedtime.    [provider]  meclizine (ANTIVERT) 25 MG tablet Take 25 mg by mouth 2 (two) times daily as needed for dizziness.     [provider]  Melatonin 5 MG CAPS Take 5 mg by mouth at bedtime.     [provider]  metFORMIN (GLUCOPHAGE) 1000 MG tablet Take 1,000 mg by mouth 2 (two) times  daily as needed (for BS >110 in the morning and >150 in the evening).     [provider]  methocarbamol (ROBAXIN) 500 MG tablet Take 1 tablet (500 mg total) by mouth every 8 (eight) hours as needed for muscle spasms. 12/29/18   Tylene Fantasia, PA-C  metoprolol tartrate (LOPRESSOR) 25 MG tablet Take 25 mg by mouth 2 (two) times daily as needed. 12/01/18   [provider]  ondansetron (ZOFRAN) 4 MG tablet Take 1 tablet (4 mg total) by mouth every 8 (eight) hours as needed for nausea or vomiting. 11/09/18   Pabon, Diego F, MD  pantoprazole (PROTONIX) 40 MG tablet Take 40 mg by mouth daily.     [provider]  RESTASIS 0.05 % ophthalmic emulsion Place 1 drop into both eyes 2 (two) times daily.     [provider]  rOPINIRole (REQUIP) 0.5 MG tablet Take 0.5 mg by mouth at bedtime.  06/09/18   [provider]    Allergies Shellfish allergy, Sodium sulfite, and Codeine  Family History  Problem Relation Age of Onset  . Cancer Father  bone cancer  . Cancer Sister        lymphoma  . Cancer Brother   . Breast cancer Neg Hx     Social History Social History   Tobacco Use  . Smoking status: Never Smoker  . Smokeless tobacco: Never Used  Substance Use Topics  . Alcohol use: No  . Drug use: No    Review of Systems Constitutional: No fever/chills. Positive for generalized weakness. Eyes: No visual changes. ENT: No sore throat. Cardiovascular: Denies chest pain. Respiratory: Positive for shortness of breath, feeling the need to cough. Gastrointestinal: No abdominal pain.  No nausea, no vomiting.  No diarrhea.   Genitourinary: Negative for dysuria. Musculoskeletal: Negative for back pain. Skin: Negative for rash. Neurological: Negative for headaches, focal weakness or numbness.  ____________________________________________   PHYSICAL EXAM:  VITAL SIGNS: ED Triage Vitals  Enc Vitals Group     BP 03/20/19 1940 (!) 160/72     Pulse  Rate 03/20/19 1940 96     Resp 03/20/19 1940 12     Temp 03/20/19 1940 98.6 F (37 C)     Temp Source 03/20/19 1940 Oral     SpO2 03/20/19 1931 99 %     Weight 03/20/19 1933 151 lb 3.2 oz (68.6 kg)     Height 03/20/19 1933 5\' 4"  (1.626 m)     Head Circumference --      Peak Flow --      Pain Score 03/20/19 1932 0   Constitutional: Awake and alert.  Eyes: Conjunctivae are normal.  ENT      Head: Normocephalic and atraumatic.      Nose: No congestion/rhinnorhea.      Mouth/Throat: Mucous membranes are moist.      Neck: Tracheostomy. Hematological/Lymphatic/Immunilogical: No cervical lymphadenopathy. Cardiovascular: Normal rate, regular rhythm.  No murmurs, rubs, or gallops.  Respiratory: Normal respiratory effort without tachypnea nor retractions. Breath sounds are clear and equal bilaterally. No wheezes/rales/rhonchi. Gastrointestinal: Soft and non tender. No rebound. No guarding.  Genitourinary: Deferred Musculoskeletal: Normal range of motion in all extremities. No lower extremity edema. Neurologic: Able to nod yes/no. Moving all extremities. No gross focal neurologic deficits are appreciated.  Skin:  Skin is warm, dry and intact. No rash noted. ____________________________________________    LABS (pertinent positives/negatives)  CBC wbc 13.3, hgb 14.2, plt 259 VBG pH 7.23, pCO2 79 Trop hs 9 COVID negative CMP wnl except glu 168, t pro 8.7 ____________________________________________   EKG  I, Nance Pear, attending physician, personally viewed and interpreted this EKG  EKG Time: 1937 Rate: 97 Rhythm: sinus rhythm Axis: normal Intervals: qtc 469 QRS: narrow, q waves v1 ST changes: no st elevation Impression: abnormal ekg  ____________________________________________    RADIOLOGY  CT head No acute abnormality  CXR No acute abnormality  CT angio No PE. Possible atelectasis vs  aspiration  ____________________________________________   PROCEDURES  Procedures  ____________________________________________   INITIAL IMPRESSION / ASSESSMENT AND PLAN / ED COURSE  Pertinent labs & imaging results that were available during my care of the patient were reviewed by me and considered in my medical decision making (see chart for details).   Patient presented to the emergency department today because of concerns for weakness.  Apparently the weakness did start today.  On initial exam patient was awake and alert.  She would only nod yes or no given the trach.  Work-up was initiated for weakness including concerns for possible infection.  Initial VBG did show elevated CO2.  On  reassessment with the patient had become more somnolent.  I did have concerns for elevated CO2 causing some of the somnolence so I did ask respiratory to switch out the trach and put patient on ventilator.  This did start correcting the elevated CO2 and pH.  Patient however continue to be somnolent.  Patient had a mild leukocytosis but no lactic acidosis.  Patient was afebrile.  Patient's chest x-ray without concerning findings.  Did get CT scan to better evaluate lungs and also to look for PE. No PE, question atelectasis vs aspiration.  Head CT was negative. At this time unclear etiology of the patient's AMS. Will add on broad spectrum antibiotics in case it is an infectious cause, however have somewhat low suspicion given lack of source at this time and lack of fever or lactic acidosis. Will plan on admission.  ____________________________________________   FINAL CLINICAL IMPRESSION(S) / ED DIAGNOSES  Final diagnoses:  Weakness  Altered mental status, unspecified altered mental status type  Hypoxia     Note: This dictation was prepared with Dragon dictation. Any transcriptional errors that result from this process are unintentional     Nance Pear, MD 03/21/19 0009

## 2019-03-20 NOTE — ED Notes (Addendum)
This RN assisted CT tech and RT with transportation to CT scan.

## 2019-03-20 NOTE — ED Notes (Signed)
RT at bedside performing tracheostomy care.

## 2019-03-21 ENCOUNTER — Inpatient Hospital Stay: Payer: Medicare Other

## 2019-03-21 DIAGNOSIS — J9622 Acute and chronic respiratory failure with hypercapnia: Secondary | ICD-10-CM | POA: Diagnosis present

## 2019-03-21 DIAGNOSIS — J9602 Acute respiratory failure with hypercapnia: Secondary | ICD-10-CM | POA: Diagnosis not present

## 2019-03-21 DIAGNOSIS — I959 Hypotension, unspecified: Secondary | ICD-10-CM | POA: Diagnosis present

## 2019-03-21 DIAGNOSIS — E872 Acidosis: Secondary | ICD-10-CM | POA: Diagnosis present

## 2019-03-21 DIAGNOSIS — H409 Unspecified glaucoma: Secondary | ICD-10-CM | POA: Diagnosis present

## 2019-03-21 DIAGNOSIS — J15212 Pneumonia due to Methicillin resistant Staphylococcus aureus: Secondary | ICD-10-CM | POA: Diagnosis present

## 2019-03-21 DIAGNOSIS — E1142 Type 2 diabetes mellitus with diabetic polyneuropathy: Secondary | ICD-10-CM | POA: Diagnosis present

## 2019-03-21 DIAGNOSIS — J3802 Paralysis of vocal cords and larynx, bilateral: Secondary | ICD-10-CM | POA: Diagnosis present

## 2019-03-21 DIAGNOSIS — E7800 Pure hypercholesterolemia, unspecified: Secondary | ICD-10-CM | POA: Diagnosis present

## 2019-03-21 DIAGNOSIS — Z9221 Personal history of antineoplastic chemotherapy: Secondary | ICD-10-CM | POA: Diagnosis not present

## 2019-03-21 DIAGNOSIS — M810 Age-related osteoporosis without current pathological fracture: Secondary | ICD-10-CM | POA: Diagnosis present

## 2019-03-21 DIAGNOSIS — J9601 Acute respiratory failure with hypoxia: Secondary | ICD-10-CM | POA: Diagnosis not present

## 2019-03-21 DIAGNOSIS — J398 Other specified diseases of upper respiratory tract: Secondary | ICD-10-CM | POA: Diagnosis present

## 2019-03-21 DIAGNOSIS — J440 Chronic obstructive pulmonary disease with acute lower respiratory infection: Secondary | ICD-10-CM | POA: Diagnosis present

## 2019-03-21 DIAGNOSIS — Z930 Tracheostomy status: Secondary | ICD-10-CM | POA: Diagnosis not present

## 2019-03-21 DIAGNOSIS — Z79899 Other long term (current) drug therapy: Secondary | ICD-10-CM | POA: Diagnosis not present

## 2019-03-21 DIAGNOSIS — R531 Weakness: Secondary | ICD-10-CM | POA: Diagnosis not present

## 2019-03-21 DIAGNOSIS — K219 Gastro-esophageal reflux disease without esophagitis: Secondary | ICD-10-CM | POA: Diagnosis present

## 2019-03-21 DIAGNOSIS — J9600 Acute respiratory failure, unspecified whether with hypoxia or hypercapnia: Secondary | ICD-10-CM | POA: Diagnosis present

## 2019-03-21 DIAGNOSIS — R4182 Altered mental status, unspecified: Secondary | ICD-10-CM | POA: Diagnosis not present

## 2019-03-21 DIAGNOSIS — Z7984 Long term (current) use of oral hypoglycemic drugs: Secondary | ICD-10-CM | POA: Diagnosis not present

## 2019-03-21 DIAGNOSIS — Z853 Personal history of malignant neoplasm of breast: Secondary | ICD-10-CM | POA: Diagnosis not present

## 2019-03-21 DIAGNOSIS — G9340 Encephalopathy, unspecified: Secondary | ICD-10-CM

## 2019-03-21 DIAGNOSIS — J9621 Acute and chronic respiratory failure with hypoxia: Secondary | ICD-10-CM | POA: Diagnosis present

## 2019-03-21 DIAGNOSIS — Z79890 Hormone replacement therapy (postmenopausal): Secondary | ICD-10-CM | POA: Diagnosis not present

## 2019-03-21 DIAGNOSIS — Z20822 Contact with and (suspected) exposure to covid-19: Secondary | ICD-10-CM | POA: Diagnosis present

## 2019-03-21 DIAGNOSIS — R0902 Hypoxemia: Secondary | ICD-10-CM | POA: Diagnosis not present

## 2019-03-21 DIAGNOSIS — G4733 Obstructive sleep apnea (adult) (pediatric): Secondary | ICD-10-CM | POA: Diagnosis present

## 2019-03-21 LAB — CBC
HCT: 35.5 % — ABNORMAL LOW (ref 36.0–46.0)
Hemoglobin: 10.8 g/dL — ABNORMAL LOW (ref 12.0–15.0)
MCH: 25.2 pg — ABNORMAL LOW (ref 26.0–34.0)
MCHC: 30.4 g/dL (ref 30.0–36.0)
MCV: 82.9 fL (ref 80.0–100.0)
Platelets: 202 10*3/uL (ref 150–400)
RBC: 4.28 MIL/uL (ref 3.87–5.11)
RDW: 13.6 % (ref 11.5–15.5)
WBC: 12.2 10*3/uL — ABNORMAL HIGH (ref 4.0–10.5)
nRBC: 0 % (ref 0.0–0.2)

## 2019-03-21 LAB — BASIC METABOLIC PANEL
Anion gap: 5 (ref 5–15)
BUN: 15 mg/dL (ref 8–23)
CO2: 28 mmol/L (ref 22–32)
Calcium: 8.5 mg/dL — ABNORMAL LOW (ref 8.9–10.3)
Chloride: 106 mmol/L (ref 98–111)
Creatinine, Ser: 0.87 mg/dL (ref 0.44–1.00)
GFR calc Af Amer: >60 mL/min (ref >60)
GFR calc non Af Amer: >60 mL/min (ref >60)
Glucose, Bld: 145 mg/dL — ABNORMAL HIGH (ref 70–99)
Potassium: 4.3 mmol/L (ref 3.5–5.1)
Sodium: 139 mmol/L (ref 135–145)

## 2019-03-21 LAB — BLOOD GAS, ARTERIAL
Acid-Base Excess: 0.8 mmol/L (ref 0.0–2.0)
Bicarbonate: 25.3 mmol/L (ref 20.0–28.0)
FIO2: 0.4
MECHVT: 420 mL
O2 Saturation: 98.6 %
PEEP: 5 cmH2O
Patient temperature: 37
RATE: 20 resp/min
pCO2 arterial: 39 mmHg (ref 32.0–48.0)
pH, Arterial: 7.42 (ref 7.350–7.450)
pO2, Arterial: 117 mmHg — ABNORMAL HIGH (ref 83.0–108.0)

## 2019-03-21 LAB — PROCALCITONIN: Procalcitonin: <0.1 ng/mL

## 2019-03-21 LAB — GLUCOSE, CAPILLARY
Glucose-Capillary: 140 mg/dL — ABNORMAL HIGH (ref 70–99)
Glucose-Capillary: 137 mg/dL — ABNORMAL HIGH (ref 70–99)
Glucose-Capillary: 93 mg/dL (ref 70–99)
Glucose-Capillary: 90 mg/dL (ref 70–99)
Glucose-Capillary: 93 mg/dL (ref 70–99)
Glucose-Capillary: 91 mg/dL (ref 70–99)
Glucose-Capillary: 100 mg/dL — ABNORMAL HIGH (ref 70–99)

## 2019-03-21 LAB — PHOSPHORUS: Phosphorus: 2.9 mg/dL (ref 2.5–4.6)

## 2019-03-21 LAB — HEMOGLOBIN A1C
Hgb A1c MFr Bld: 6.6 % — ABNORMAL HIGH (ref 4.8–5.6)
Mean Plasma Glucose: 142.72 mg/dL

## 2019-03-21 LAB — MAGNESIUM: Magnesium: 1.5 mg/dL — ABNORMAL LOW (ref 1.7–2.4)

## 2019-03-21 MED ORDER — LORAZEPAM 2 MG/ML IJ SOLN
0.5000 mg | Freq: Once | INTRAMUSCULAR | Status: AC
  Administered 2019-03-21: 22:00:00 0.5 mg via INTRAVENOUS
  Filled 2019-03-21: qty 1

## 2019-03-21 MED ORDER — PANTOPRAZOLE SODIUM 40 MG IV SOLR
40.0000 mg | Freq: Every day | INTRAVENOUS | Status: DC
  Administered 2019-03-21 – 2019-03-23 (×3): 40 mg via INTRAVENOUS
  Filled 2019-03-21 (×3): qty 40

## 2019-03-21 MED ORDER — LACTATED RINGERS IV BOLUS
1000.0000 mL | Freq: Once | INTRAVENOUS | Status: AC
  Administered 2019-03-21: 10:00:00 1000 mL via INTRAVENOUS

## 2019-03-21 MED ORDER — IPRATROPIUM-ALBUTEROL 0.5-2.5 (3) MG/3ML IN SOLN: 3.0000 mL | Freq: Four times a day (QID) | RESPIRATORY_TRACT | Status: DC | PRN

## 2019-03-21 MED ORDER — CHLORHEXIDINE GLUCONATE CLOTH 2 % EX PADS
6.0000 | MEDICATED_PAD | Freq: Every day | CUTANEOUS | Status: DC
  Administered 2019-03-21 – 2019-03-31 (×11): 6 via TOPICAL

## 2019-03-21 MED ORDER — SODIUM CHLORIDE 0.9 % IV SOLN
4.0000 g | Freq: Once | INTRAVENOUS | Status: DC
  Filled 2019-03-21: qty 8

## 2019-03-21 MED ORDER — CYCLOSPORINE 0.05 % OP EMUL
1.0000 [drp] | Freq: Two times a day (BID) | OPHTHALMIC | Status: DC
  Administered 2019-03-21 – 2019-04-01 (×21): 1 [drp] via OPHTHALMIC
  Filled 2019-03-21 (×24): qty 1

## 2019-03-21 MED ORDER — SODIUM CHLORIDE 0.9 % IV BOLUS
1000.0000 mL | Freq: Once | INTRAVENOUS | Status: AC
  Administered 2019-03-21: 1000 mL via INTRAVENOUS

## 2019-03-21 MED ORDER — LACTATED RINGERS IV SOLN: INTRAVENOUS | Status: DC

## 2019-03-21 MED ORDER — SODIUM CHLORIDE 0.9 % IV SOLN
2.0000 g | Freq: Two times a day (BID) | INTRAVENOUS | Status: DC
  Administered 2019-03-21 – 2019-03-23 (×5): 2 g via INTRAVENOUS
  Filled 2019-03-21 (×6): qty 2

## 2019-03-21 MED ORDER — MAGNESIUM SULFATE 4 GM/100ML IV SOLN
4.0000 g | Freq: Once | INTRAVENOUS | Status: AC
  Administered 2019-03-21: 08:00:00 4 g via INTRAVENOUS
  Filled 2019-03-21: qty 100

## 2019-03-21 MED ORDER — GLYCOPYRROLATE 0.2 MG/ML IJ SOLN
0.1000 mg | Freq: Three times a day (TID) | INTRAMUSCULAR | Status: DC
  Administered 2019-03-21 – 2019-03-22 (×3): 0.1 mg via INTRAVENOUS
  Filled 2019-03-21 (×3): qty 1

## 2019-03-21 MED ORDER — NOREPINEPHRINE 4 MG/250ML-% IV SOLN: 2.0000 ug/min | INTRAVENOUS | Status: DC

## 2019-03-21 MED ORDER — INSULIN ASPART 100 UNIT/ML ~~LOC~~ SOLN
0.0000 [IU] | SUBCUTANEOUS | Status: DC
  Administered 2019-03-21 – 2019-03-23 (×2): 1 [IU] via SUBCUTANEOUS
  Administered 2019-03-24: 12:00:00 2 [IU] via SUBCUTANEOUS
  Administered 2019-03-24 (×2): 1 [IU] via SUBCUTANEOUS
  Administered 2019-03-25 (×4): 2 [IU] via SUBCUTANEOUS
  Administered 2019-03-26: 18:00:00 1 [IU] via SUBCUTANEOUS
  Administered 2019-03-26: 2 [IU] via SUBCUTANEOUS
  Administered 2019-03-26: 08:00:00 1 [IU] via SUBCUTANEOUS
  Administered 2019-03-26 (×2): 2 [IU] via SUBCUTANEOUS
  Administered 2019-03-27 – 2019-03-28 (×5): 1 [IU] via SUBCUTANEOUS
  Administered 2019-03-28: 20:00:00 2 [IU] via SUBCUTANEOUS
  Administered 2019-03-28: 1 [IU] via SUBCUTANEOUS
  Administered 2019-03-28 (×2): 2 [IU] via SUBCUTANEOUS
  Administered 2019-03-29: 1 [IU] via SUBCUTANEOUS
  Filled 2019-03-21 (×22): qty 1

## 2019-03-21 MED ORDER — ONDANSETRON HCL 4 MG/2ML IJ SOLN: 4.0000 mg | Freq: Four times a day (QID) | INTRAMUSCULAR | Status: DC | PRN

## 2019-03-21 MED ORDER — SODIUM CHLORIDE 0.9 % IV SOLN
2.0000 g | Freq: Three times a day (TID) | INTRAVENOUS | Status: DC
  Filled 2019-03-21: qty 2

## 2019-03-21 MED ORDER — SODIUM CHLORIDE 0.9 % IV SOLN: 250.0000 mL | INTRAVENOUS | Status: DC

## 2019-03-21 MED ORDER — ENOXAPARIN SODIUM 40 MG/0.4ML ~~LOC~~ SOLN
40.0000 mg | SUBCUTANEOUS | Status: DC
  Administered 2019-03-21 – 2019-04-01 (×12): 40 mg via SUBCUTANEOUS
  Filled 2019-03-21 (×12): qty 0.4

## 2019-03-21 MED ORDER — FAMOTIDINE IN NACL 20-0.9 MG/50ML-% IV SOLN: 20.0000 mg | Freq: Two times a day (BID) | INTRAVENOUS | Status: DC

## 2019-03-21 MED ORDER — METOPROLOL TARTRATE 5 MG/5ML IV SOLN
2.5000 mg | Freq: Four times a day (QID) | INTRAVENOUS | Status: DC | PRN
  Administered 2019-03-21 – 2019-03-28 (×3): 2.5 mg via INTRAVENOUS
  Filled 2019-03-21 (×3): qty 5

## 2019-03-21 NOTE — Progress Notes (Signed)
Pt reports increased drooling from L side of mouth and states that she has numbness and tingling of L cheek. NIHSS performed. No other symptoms noted. Dr Lanney Gins aware, plan to reassess neuro status in one hour.

## 2019-03-21 NOTE — Progress Notes (Signed)
Returned from MRI, patient tolerated transport to and from. Did become tachy when transferring, but asymptomatic.

## 2019-03-21 NOTE — Progress Notes (Signed)
Pt was transported to ICU on vent without incident. Report given to ICU RT.

## 2019-03-21 NOTE — Progress Notes (Signed)
Patient transported to MRI and back to ICU on transport vent. Patient tolerated transport and MRI well. No complications or distress noted. Patient suctioned before and after transport; small amount of thick tan secretions removed.

## 2019-03-21 NOTE — Progress Notes (Addendum)
Pharmacy Antibiotic Note  Angela David is a 79 y.o. female admitted on 03/20/2019 with sepsis.  Pharmacy has been consulted for Cefepime dosing.  Plan: Cefepime 2gm IV q12H  Height: 5\' 3"  (160 cm) Weight: 145 lb 11.6 oz (66.1 kg) IBW/kg (Calculated) : 52.4  Temp (24hrs), Avg:99.1 F (37.3 C), Min:98.6 F (37 C), Max:99.6 F (37.6 C)  Recent Labs  Lab 03/20/19 1939 03/21/19 0324  WBC 13.3* 12.2*  CREATININE 0.74 0.87  LATICACIDVEN 0.9  --     Estimated Creatinine Clearance: 50.3 mL/min (by C-G formula based on SCr of 0.87 mg/dL).    Allergies  Allergen Reactions  . Shellfish Allergy Shortness Of Breath    respitatory distress   . Sodium Sulfite Shortness Of Breath    Severe Congestion that creates inability to breath  . Codeine Other (See Comments)    Per patient "it kept her up all night"    Antimicrobials this admission: Cefepime 1/23 >>  Vancomycin 1/23 x 1   Microbiology results: 1/23 BCx: pending 1/23 Tracheal Aspirate: GPC and pending  Thank you for allowing pharmacy to be a part of this patient's care.  Oswald Hillock, PharmD, BCPS 03/21/2019 7:17 AM

## 2019-03-21 NOTE — ED Notes (Signed)
Pt is opening eyes spontaneously, and able to follow commands. Respirations are unlabored and no signs of acute distress.

## 2019-03-21 NOTE — Progress Notes (Signed)
Pt transported to CT and returned to ER on vent without incident.

## 2019-03-21 NOTE — Progress Notes (Signed)
Pharmacy Antibiotic Note  Angela David is a 79 y.o. female admitted on 03/20/2019 with sepsis.  Pharmacy has been consulted for Cefepime dosing.  Plan: Cefepime 2gm IV q8hrs  Height: 5\' 3"  (160 cm) Weight: 145 lb 11.6 oz (66.1 kg) IBW/kg (Calculated) : 52.4  Temp (24hrs), Avg:99.1 F (37.3 C), Min:98.6 F (37 C), Max:99.6 F (37.6 C)  Recent Labs  Lab 03/20/19 1939  WBC 13.3*  CREATININE 0.74  LATICACIDVEN 0.9    Estimated Creatinine Clearance: 54.7 mL/min (by C-G formula based on SCr of 0.74 mg/dL).    Allergies  Allergen Reactions  . Shellfish Allergy Shortness Of Breath    respitatory distress   . Sodium Sulfite Shortness Of Breath    Severe Congestion that creates inability to breath  . Codeine Other (See Comments)    Per patient "it kept her up all night"    Antimicrobials this admission: Cefepime 1/23 >>  Vancomycin 1/23 x 1   Dose adjustments this admission:   Microbiology results:  BCx:   UCx:    Sputum:    MRSA PCR:   Thank you for allowing pharmacy to be a part of this patient's care.  Hart Robinsons A 03/21/2019 3:56 AM

## 2019-03-21 NOTE — H&P (Signed)
Name: Angela David MRN: [id number] DOB: [birth year]    ADMISSION DATE:  03/20/2019 CONSULTATION DATE: 03/20/2019  REFERRING MD : Dr. Archie Balboa  CHIEF COMPLAINT: Generalized Weakness   BRIEF PATIENT DESCRIPTION:  79 yo female with tracheal stenosis requiring chronic tracheostomy admitted with generalized weakness, hypotension secondary to hypovolemia vs. 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 Atrophy and presumed chronic small vessel ischemic change of the white matter 01/22: CTA Chest revealed no pulmonary embolus. Dilated fluid-filled esophagus from the thoracic inlet to the gastroesophageal junction, may be reflux or dysmotility. Achalasia also considered. Mild dependent opacities in both lower lobes, likely atelectasis. Given esophageal dilatation, aspiration is also considered. Subacute right rib fractures. Some interval incomplete fracture healing since October 2020 injury. Stable right upper lobe scarring and benign pulmonary nodules. Aortic Atherosclerosis (ICD10-I70.0). 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  She presented to [facility] ER on 01/22 via EMS  from home with worsening generalized weakness throughout the day.  Per ER notes EMS reported upon their arrival at pts home her O2 sats were 87% on RA although pt denied shortness of breath, therefore 2L NRB placed over chronic tracheostomy with O2 sats increasing to 92%.  She does NOT require chronic home O2 at baseline.  In the ER lab results revealed glucose 168, wbc 13.3, lactic acid 0.9, vbg pH 7.23/pCO2 79, influenza pcr negative, and COVID-19 negative. Pt initially alert and nodding yes/no appropriately, however she later developed lethargy with repeat vbg revealing worsening hypercapnia. CXR concerning for left bibasilar atelectasis.  Therefore, pt required exchange of #6 uncuffed trach to #6 cuffed trach for mechanical ventilation.  During exchange of tracheostomy pt noted to have moderate amount of thick tan secretions requiring suctioning.  CTA Chest negative for pulmonary embolism but revealed subacute right rib fractures, possible atelectasis, and benign pulmonary nodules.  CT Head negative for acute intracranial abnormality.  She received cefepime and vancomycin.  She was subsequently admitted to the ICU for additional workup and treatment.    PAST MEDICAL HISTORY :   has a past medical history of Breast cancer ([place]) (2002), Cancer ([place]) (2002), Diabetes mellitus without complication ([place]), Diabetic peripheral neuropathy ([place]) (08/28/2016), GERD (gastroesophageal reflux disease), Glaucoma, High cholesterol, History of colon polyps, Peripheral neuropathy (08/28/2016), Personal history of chemotherapy (2002), Personal history of malignant neoplasm of breast, Personal history of radiation therapy (2002), and Vocal cord paralysis (2012).  has a past surgical history that includes Abdominal hysterectomy; Hydrocele excision / repair; Colonoscopy (2009,12/30/2013); Tracheal surgery (2013); Breast surgery (Right, 2002); Tracheostomy (N/A); Breast lumpectomy (Right, 2002); Cholecystectomy (N/A, 12/10/2017);  Breast biopsy (Right, 2008); Breast excisional biopsy (Right, 2002); Colonoscopy with propofol (N/A, 04/16/2018); Esophagogastroduodenoscopy (egd) with propofol (N/A, 10/14/2018); and Colon resection (Right, 11/06/2018). Prior to Admission medications   Medication Sig Start Date End Date Taking? Authorizing  Biotin 1000 MCG tablet Take 1,000 mcg by mouth daily.    Yes [provider]  °busPIRone (BUSPAR) 15 MG tablet Take 30 mg by mouth 2 (two) times daily.    Yes [provider]  °Calcium Carbonate-Vitamin D 600-400 MG-UNIT tablet Take 1 tablet by mouth daily.    Yes [provider]  °escitalopram (LEXAPRO) 10 MG tablet Take 10 mg by mouth daily.  01/13/15  Yes [provider]  °escitalopram (LEXAPRO) 20 MG tablet Take 20 mg by mouth daily.   Yes [provider]  °folic acid (FOLVITE) 1 MG tablet Take 1 mg by mouth daily.   Yes [provider]  °gabapentin (NEURONTIN) 600 MG tablet Take 600 mg by mouth 3 (three) times daily.    Yes [provider]  °lovastatin (MEVACOR) 20 MG tablet Take 20 mg by mouth at bedtime.   Yes [provider]  °metFORMIN (GLUCOPHAGE) 1000 MG tablet Take 1,000 mg by mouth 2 (two) times daily as needed (for BS >110 in the morning and >150 in the evening).    Yes [provider]  °metoprolol tartrate (LOPRESSOR) 25 MG tablet Take 25 mg by mouth 2 (two) times daily as needed. 12/01/18  Yes [provider]  °pantoprazole (PROTONIX) 40 MG tablet Take 40 mg by mouth daily.    Yes [provider]  °RESTASIS 0.05 % ophthalmic emulsion Place 1 drop into both eyes 2 (two) times daily.    Yes [provider]  °rOPINIRole (REQUIP) 0.5 MG tablet Take 0.5 mg by mouth at bedtime.  06/09/18  Yes [provider]  °acetaminophen (TYLENOL) 500 MG tablet Take 500 mg by mouth daily.     [provider]  °Acetaminophen-Codeine 300-30 MG tablet Take 1 tablet by mouth 2 (two) times  daily.    [provider]  °iron polysaccharides (FERREX 150) 150 MG capsule Take by mouth. 04/30/18 04/30/19  [provider]  °meclizine (ANTIVERT) 25 MG tablet Take 25 mg by mouth 2 (two) times daily as needed for dizziness.     [provider]  °Melatonin 5 MG CAPS Take 5 mg by mouth at bedtime.     [provider]  °methocarbamol (ROBAXIN) 500 MG tablet Take 1 tablet (500 mg total) by mouth every 8 (eight) hours as needed for muscle spasms. 12/29/18   Schulz, Zachary R, PA-C  °ondansetron (ZOFRAN) 4 MG tablet Take 1 tablet (4 mg total) by mouth every 8 (eight) hours as needed for nausea or vomiting. 11/09/18   Pabon, Diego F, MD  ° °Allergies  °Allergen Reactions  °• Shellfish Allergy Shortness Of Breath  °  respitatory distress   °• Sodium Sulfite Shortness Of Breath  °  Severe Congestion that creates inability to breath  °• Codeine Other (See Comments)  °  Per patient "it kept her up all night"  ° ° °FAMILY HISTORY:  °family history includes Cancer in her brother, father, and sister. °SOCIAL HISTORY: ° reports that she has never smoked. She has never used smokeless tobacco. She reports that she does not drink alcohol or use drugs. ° °REVIEW OF SYSTEMS:   °Unable to assess pt receiving mechanical ventilation via chronic tracheostomy  ° °SUBJECTIVE:  °Unable to assess pt receiving mechanical ventilation via chronic tracheostomy  ° °VITAL SIGNS: °Temp:  [98.6 °F (37 °C)] 98.6 °F (37 °C) (01/22 1940) °Pulse Rate:  [78-124] 89 (01/23 0052) °Resp:  [12-28] 20 (01/23 0052) °BP: (79-172)/(33-86) 128/57 (01/23 0052) °SpO2:  [  96 %-100 %] 100 % (01/23 0052) °FiO2 (%):  [40 %-60 %] 40 % (01/22 2131) °Weight:  [65.8 kg-68.6 kg] 65.8 kg (01/22 2033) ° °PHYSICAL EXAMINATION: °General: chronically ill appearing female, NAD requiring mechanical ventilation  °Neuro: alert, follows commands, PERRL  °HEENT: #6 shiley dry and intact, no JVD  °Cardiovascular: nsr, rrr, no R/G  °Lungs: faint rhonchi  throughout, even, non labored  °Abdomen: +BS x4, soft, non tender, non distended  °Musculoskeletal: normal bulk and tone, no edema  °Skin: intact no rashes or lesions present  ° °Recent Labs  °Lab 03/20/19 °1939  °NA 139  °K 4.2  °CL 101  °CO2 30  °BUN 16  °CREATININE 0.74  °GLUCOSE 168*  ° °Recent Labs  °Lab 03/20/19 °1939  °HGB 14.2  °HCT 47.1*  °WBC 13.3*  °PLT 259  ° °CT Head Wo Contrast ° °Result Date: 03/20/2019 °CLINICAL DATA:  Altered mental status EXAM: CT HEAD WITHOUT CONTRAST TECHNIQUE: Contiguous axial images were obtained from the base of the skull through the vertex without intravenous contrast. COMPARISON:  CT brain 07/11/2014 FINDINGS: Brain: No acute territorial infarction, hemorrhage, or intracranial mass. Mild atrophy. Moderate to marked periventricular, subcortical and deep white matter hypodensities. Stable ventricle size. Vascular: No hyperdense vessels. Vertebral and carotid vascular calcification Skull: Normal. Negative for fracture or focal lesion. Sinuses/Orbits: No acute finding. Other: None IMPRESSION: 1. No CT evidence for acute intracranial abnormality. 2. Atrophy and presumed chronic small vessel ischemic change of the white matter Electronically Signed   By: Kim  Fujinaga M.D.   On: 03/20/2019 23:45  ° °CT Angio Chest PE W and/or Wo Contrast ° °Result Date: 03/20/2019 °CLINICAL DATA:  Weakness. Hypoxia. EXAM: CT ANGIOGRAPHY CHEST WITH CONTRAST TECHNIQUE: Multidetector CT imaging of the chest was performed using the standard protocol during bolus administration of intravenous contrast. Multiplanar CT image reconstructions and MIPs were obtained to evaluate the vascular anatomy. CONTRAST:  75mL OMNIPAQUE IOHEXOL 350 MG/ML SOLN COMPARISON:  Radiograph earlier this day. Most recent chest CT 12/25/2018 FINDINGS: Cardiovascular: There are no filling defects within the pulmonary arteries to suggest pulmonary embolus. Aortic atherosclerosis without aneurysm. No aortic dissection. Heart is  normal in size. There are coronary artery calcifications. No pericardial effusion. Mediastinum/Nodes: Dilated fluid-filled esophagus from the thoracic inlet to the gastroesophageal junction. No associated esophageal wall thickening. Tracheostomy tube tip the level of the clavicles. No visualized thyroid nodule. No enlarged mediastinal or hilar lymph nodes. Lungs/Pleura: Mild dependent opacities in both lower lobes. Right apical scarring. There scattered subpleural nodules in the right lung, largest measuring 4 mm in the superior segment of the lower lobe, series 6, image 26. These are unchanged from prior exam, and reportedly stable since 2013. The previous right pneumothorax has resolved. No findings of pulmonary edema. No significant pleural fluid. Trachea and mainstem bronchi are patent. Upper Abdomen: No acute finding. Musculoskeletal: Subacute right rib fractures as seen on prior exam. Some interval incomplete fracture healing. No new osseous abnormality. Remote left lower rib fractures. Chronic right breast calcifications. Review of the MIP images confirms the above findings. IMPRESSION: 1. No pulmonary embolus. 2. Dilated fluid-filled esophagus from the thoracic inlet to the gastroesophageal junction, may be reflux or dysmotility. Achalasia also considered. 3. Mild dependent opacities in both lower lobes, likely atelectasis. Given esophageal dilatation, aspiration is also considered. 4. Subacute right rib fractures. Some interval incomplete fracture healing since October 2020 injury. 5. Stable right upper lobe scarring and benign pulmonary nodules. Aortic Atherosclerosis (ICD10-I70.0). Electronically Signed     By: Melanie  Sanford M.D.   On: 03/20/2019 23:49  ° °DG Chest Port 1 View ° °Result Date: 03/20/2019 °CLINICAL DATA:  Hypoxia EXAM: PORTABLE CHEST 1 VIEW COMPARISON:  03/04/2019, 02/03/2019 FINDINGS: Tracheostomy tube is in place. Low lung volumes. Streaky opacity left base likely atelectasis. Stable  cardiomediastinal silhouette. Aortic atherosclerosis. No pneumothorax. IMPRESSION: No active disease. Low lung volumes with streaky left basilar atelectasis Electronically Signed   By: Kim  Fujinaga M.D.   On: 03/20/2019 20:04  ° ° °ASSESSMENT / PLAN: ° °Acute on chronic hypercapnic hypoxic respiratory failure likely secondary to mucous plugging °Mechanical ventilation via chronic tracheostomy  °Hx: Vocal Cord Paralysis and Tracheal Stenosis requiring Chronic Tracheostomy °Full vent support for now-vent settings reviewed and established  °SBT once all parameters met  °VAP bundle implemented  °Prn bronchodilator therapy  °Pulmonary hygiene ° °Hypotension suspected secondary to hypovolemia vs. sepsis  °Hx: Hypercholesteremia  °Continuous telemetry monitoring  °Fluid resuscitation to maintain map >65, if pt remains hypotensive will start peripheral levophed gtt  ° °Leukocytosis etiology unclear possibly secondary to infection vs. reactive  °Trend WBC and monitor fever curve  °Will check PCT  °Follow cultures  °Will continue cefepime for now  ° °Type II Diabetes Mellitus  °CBG's q4hrs  °SSI  ° °GERD °IV protonix  ° °Acute encephalopathy likely secondary to hypercapnia °Avoid sedating medication  °Frequent reorientation  ° °Best Practice: °VTE px: subq lovenox °SUP: iv protonix  °Diet: keep NPO for now and hold po outpatient medications, if pt continues to require mechanical ventilation in the next 24hrs will place NG tube  ° ° , AGNP  °Pulmonary/Critical Care °Pager [phone] (please enter 7 digits) °PCCM Consult Pager [phone] (please enter 7 digits)  ° ° ° ° ° ° ° ° °

## 2019-03-21 NOTE — Progress Notes (Signed)
SLP Cancellation Note  Patient Details Name: Angela David MRN: [id number] DOB: [birth year]   Cancelled treatment:       Reason Eval/Treat Not Completed: Medical issues which prohibited therapy;Patient not medically ready; Patient recently admitted to ICU with chronic trach, & currently on the vent. Patient not appropriate for skilled ST services at this time. Will evaluate swallowing function and/or PMSV tolerance as patient weans off the vent. MD to re-consult at that time. NSG made aware of the above recommendations.   Loni Beckwith, M.S. CCC-SLP Speech-Language Pathologist  Loni Beckwith 03/21/2019, 10:55 AM

## 2019-03-21 NOTE — ED Notes (Signed)
This nurse attempted to give report to nurse getting this pt in the ICU. ICU states that they will call back in a few minutes for report.

## 2019-03-21 NOTE — ED Notes (Signed)
Pt last BP at 0011 was 84/47, Dr. Archie Balboa made aware. Verbal order given to hang 1L fluid bolus NS.

## 2019-03-21 NOTE — Progress Notes (Signed)
Order by MD to change pt uncuffed #6 cuffless fenestrated trach to a #6 cuffed trach for vent use do to pt being in respiratory distress. Using sterile technique pt uncuffed trach was removed using a bougie stylet and replaced with a #6 cuffed trach. Lurline Idol ties secure. Trach placement was confirmed with ETCO2 (color change) and BBS confirmation. Pt was placed on the vent and suctioned for a moderate amount of creamy thick secretions. Pt tol vent well at this time, will continue to monitor throughout the shift.

## 2019-03-22 ENCOUNTER — Inpatient Hospital Stay: Payer: Medicare Other

## 2019-03-22 LAB — BASIC METABOLIC PANEL
Anion gap: 6 (ref 5–15)
BUN: 11 mg/dL (ref 8–23)
CO2: 26 mmol/L (ref 22–32)
Calcium: 8.5 mg/dL — ABNORMAL LOW (ref 8.9–10.3)
Chloride: 107 mmol/L (ref 98–111)
Creatinine, Ser: 0.89 mg/dL (ref 0.44–1.00)
GFR calc Af Amer: >60 mL/min (ref >60)
GFR calc non Af Amer: >60 mL/min (ref >60)
Glucose, Bld: 98 mg/dL (ref 70–99)
Potassium: 3.2 mmol/L — ABNORMAL LOW (ref 3.5–5.1)
Sodium: 139 mmol/L (ref 135–145)

## 2019-03-22 LAB — CBC WITH DIFFERENTIAL/PLATELET
Abs Immature Granulocytes: 0.03 10*3/uL (ref 0.00–0.07)
Basophils Absolute: 0 10*3/uL (ref 0.0–0.1)
Basophils Relative: 0 %
Eosinophils Absolute: 0.1 10*3/uL (ref 0.0–0.5)
Eosinophils Relative: 1 %
HCT: 33.7 % — ABNORMAL LOW (ref 36.0–46.0)
Hemoglobin: 10.6 g/dL — ABNORMAL LOW (ref 12.0–15.0)
Immature Granulocytes: 0 %
Lymphocytes Relative: 13 %
Lymphs Abs: 1.2 10*3/uL (ref 0.7–4.0)
MCH: 25.5 pg — ABNORMAL LOW (ref 26.0–34.0)
MCHC: 31.5 g/dL (ref 30.0–36.0)
MCV: 81.2 fL (ref 80.0–100.0)
Monocytes Absolute: 0.7 10*3/uL (ref 0.1–1.0)
Monocytes Relative: 8 %
Neutro Abs: 7 10*3/uL (ref 1.7–7.7)
Neutrophils Relative %: 78 %
Platelets: 142 10*3/uL — ABNORMAL LOW (ref 150–400)
RBC: 4.15 MIL/uL (ref 3.87–5.11)
RDW: 13.9 % (ref 11.5–15.5)
WBC: 9 10*3/uL (ref 4.0–10.5)
nRBC: 0 % (ref 0.0–0.2)

## 2019-03-22 LAB — GLUCOSE, CAPILLARY
Glucose-Capillary: 107 mg/dL — ABNORMAL HIGH (ref 70–99)
Glucose-Capillary: 140 mg/dL — ABNORMAL HIGH (ref 70–99)
Glucose-Capillary: 68 mg/dL — ABNORMAL LOW (ref 70–99)
Glucose-Capillary: 82 mg/dL (ref 70–99)
Glucose-Capillary: 86 mg/dL (ref 70–99)
Glucose-Capillary: 90 mg/dL (ref 70–99)
Glucose-Capillary: 92 mg/dL (ref 70–99)

## 2019-03-22 LAB — MAGNESIUM: Magnesium: 1.8 mg/dL (ref 1.7–2.4)

## 2019-03-22 MED ORDER — DEXTROSE 50 % IV SOLN
12.5000 g | INTRAVENOUS | Status: AC
  Administered 2019-03-22: 17:00:00 12.5 g via INTRAVENOUS
  Filled 2019-03-22: qty 50

## 2019-03-22 MED ORDER — POTASSIUM CHLORIDE 10 MEQ/100ML IV SOLN
10.0000 meq | INTRAVENOUS | Status: AC
  Administered 2019-03-22 (×4): 10 meq via INTRAVENOUS
  Filled 2019-03-22 (×4): qty 100

## 2019-03-22 MED ORDER — LORAZEPAM 2 MG/ML IJ SOLN
0.5000 mg | Freq: Once | INTRAMUSCULAR | Status: AC
  Administered 2019-03-22: 0.5 mg via INTRAVENOUS
  Filled 2019-03-22: qty 1

## 2019-03-22 MED ORDER — MAGNESIUM SULFATE 2 GM/50ML IV SOLN
2.0000 g | Freq: Once | INTRAVENOUS | Status: DC
  Administered 2019-03-22: 07:00:00 2 g via INTRAVENOUS
  Filled 2019-03-22: qty 50

## 2019-03-22 NOTE — Progress Notes (Signed)
After visiting with other patients I noticed she was awake. Upon entering the room, I noticed she was watching TV. I spoke and introduced myself, she was barely able to whisper then I noticed the tracheotomy. After praying for her she was so thankful. I was able to watch her mouth words. I had prayed for her family, it seemed to touch a place of concern, she mouthed to me she wanted to see her husband. I assured her the medical team would treat her so she can get back home soon. She mouthed she was so glad to meet me. I promised to check on her later today.

## 2019-03-22 NOTE — Progress Notes (Signed)
Walking by the patient caught my attention by waving me in, she then pointed to her husband, who she was so delighted to see. I introduced myself and wished them both well. Another care team member was working with her so I did not stay.

## 2019-03-22 NOTE — Progress Notes (Signed)
Pharmacy Antibiotic Note  Angela David is a 79 y.o. female admitted on 03/20/2019 with sepsis.  Pharmacy has been consulted for Cefepime dosing.  Plan: Day 2 of abx. Cefepime 2gm IV q12H  Height: 5\' 3"  (160 cm) Weight: 159 lb 9.8 oz (72.4 kg) IBW/kg (Calculated) : 52.4  Temp (24hrs), Avg:99.2 F (37.3 C), Min:98.7 F (37.1 C), Max:100.1 F (37.8 C)  Recent Labs  Lab 03/20/19 1939 03/21/19 0324 03/22/19 0445  WBC 13.3* 12.2* 9.0  CREATININE 0.74 0.87 0.89  LATICACIDVEN 0.9  --   --     Estimated Creatinine Clearance: 51.3 mL/min (by C-G formula based on SCr of 0.89 mg/dL).    Allergies  Allergen Reactions  . Shellfish Allergy Shortness Of Breath    respitatory distress   . Sodium Sulfite Shortness Of Breath    Severe Congestion that creates inability to breath  . Codeine Other (See Comments)    Per patient "it kept her up all night"    Antimicrobials this admission: Cefepime 1/23 >>  Vancomycin 1/23 x 1   Microbiology results: 1/23 BCx: pending 1/23 Tracheal Aspirate: GPC and pending  Thank you for allowing pharmacy to be a part of this patient's care.  Oswald Hillock, PharmD, BCPS 03/22/2019 1:31 PM

## 2019-03-22 NOTE — H&P (Signed)
Name: Angela David MRN: [id number] DOB: 12-Oct-1940    ADMISSION DATE:  03/20/2019 CONSULTATION DATE: 03/20/2019  REFERRING MD : Dr. Archie Balboa  CHIEF COMPLAINT: Generalized Weakness   BRIEF PATIENT DESCRIPTION:  79 yo female with tracheal stenosis requiring chronic tracheostomy admitted with generalized weakness, hypotension secondary to hypovolemia vs. 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 Atrophy and presumed chronic small vessel ischemic change of the white matter 01/22: CTA Chest revealed no pulmonary embolus. Dilated fluid-filled esophagus from the thoracic inlet to the gastroesophageal junction, may be reflux or dysmotility. Achalasia also considered. Mild dependent opacities in both lower lobes, likely atelectasis. Given esophageal dilatation, aspiration is also considered. Subacute right rib fractures. Some interval incomplete fracture healing since October 2020 injury. Stable right upper lobe scarring and benign pulmonary nodules. Aortic Atherosclerosis (ICD10-I70.0). 01/23: Pt admitted to ICU  03/22/19- Patient is clincally improved, of mechanical ventilation.  Starting PT/OT with plan to transition to hospitalist service.   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  She presented to [facility] ER on 01/22 via EMS from home with worsening generalized weakness throughout the day.  Per ER notes EMS reported upon their arrival at pts home her O2 sats were 87% on RA although pt denied shortness of breath, therefore 2L NRB placed over chronic tracheostomy with O2 sats increasing to 92%.  She does NOT require chronic home O2 at baseline.  In the ER lab results revealed glucose 168, wbc 13.3, lactic acid 0.9, vbg pH 7.23/pCO2 79, influenza pcr negative, and COVID-19 negative. Pt initially alert and nodding yes/no appropriately, however she later developed lethargy with repeat vbg revealing worsening hypercapnia. CXR concerning for left bibasilar atelectasis.  Therefore, pt required exchange of #6 uncuffed trach to #6 cuffed trach for mechanical ventilation.  During exchange of tracheostomy pt noted to have moderate amount of thick tan secretions requiring suctioning.  CTA Chest negative for pulmonary embolism but revealed subacute right rib fractures, possible atelectasis, and benign pulmonary nodules.  CT Head negative for acute intracranial abnormality.  She received cefepime and vancomycin.  She was subsequently admitted to the ICU for additional workup and treatment.    PAST MEDICAL HISTORY :   has a past medical history of Breast cancer ([place]) (2002), Cancer ([place]) (2002), Diabetes mellitus without complication ([place]), Diabetic peripheral neuropathy ([place]) (08/28/2016), GERD (gastroesophageal reflux disease), Glaucoma, High cholesterol, History of colon polyps, Peripheral neuropathy (08/28/2016), Personal history of chemotherapy (2002), Personal history of malignant neoplasm of breast, Personal history of radiation therapy (2002), and Vocal cord paralysis (2012).  has a past surgical history that includes Abdominal hysterectomy; Hydrocele excision / repair; Colonoscopy (2009,12/30/2013);  Tracheal surgery (2013); Breast surgery (Right, 2002); Tracheostomy (N/A); Breast lumpectomy (Right, 2002); Cholecystectomy (N/A, 12/10/2017); Breast biopsy (Right, 2008); Breast excisional biopsy (Right, 2002); Colonoscopy with propofol (N/A, 04/16/2018); Esophagogastroduodenoscopy (egd) with propofol (N/A, 10/14/2018); and  Colon resection (Right, 11/06/2018). Prior to Admission medications   Medication Sig Start Date End Date Taking? Authorizing Provider  Biotin 1000 MCG tablet Take 1,000 mcg by mouth daily.    Yes [provider]  busPIRone (BUSPAR) 15 MG tablet Take 30 mg by mouth 2 (two) times daily.    Yes [provider]  Calcium Carbonate-Vitamin D 600-400 MG-UNIT tablet Take 1 tablet by mouth daily.    Yes [provider]  escitalopram (LEXAPRO) 10 MG tablet Take 10 mg by mouth daily.  01/13/15  Yes [provider]  escitalopram (LEXAPRO) 20 MG tablet Take 20 mg by mouth daily.   Yes [provider]  folic acid (FOLVITE) 1 MG tablet Take 1 mg by mouth daily.   Yes [provider]  gabapentin (NEURONTIN) 600 MG tablet Take 600 mg by mouth 3 (three) times daily.    Yes [provider]  lovastatin (MEVACOR) 20 MG tablet Take 20 mg by mouth at bedtime.   Yes [provider]  metFORMIN (GLUCOPHAGE) 1000 MG tablet Take 1,000 mg by mouth 2 (two) times daily as needed (for BS >110 in the morning and >150 in the evening).    Yes [provider]  metoprolol tartrate (LOPRESSOR) 25 MG tablet Take 25 mg by mouth 2 (two) times daily as needed. 12/01/18  Yes [provider]  pantoprazole (PROTONIX) 40 MG tablet Take 40 mg by mouth daily.    Yes [provider]  RESTASIS 0.05 % ophthalmic emulsion Place 1 drop into both eyes 2 (two) times daily.    Yes [provider]  rOPINIRole (REQUIP) 0.5 MG tablet Take 0.5 mg by mouth at bedtime.  06/09/18  Yes [provider]  acetaminophen (TYLENOL) 500 MG  tablet Take 500 mg by mouth daily.     [provider]  Acetaminophen-Codeine 300-30 MG tablet Take 1 tablet by mouth 2 (two) times daily.    [provider]  iron polysaccharides (FERREX 150) 150 MG capsule Take by mouth. 04/30/18 04/30/19  [provider]  meclizine (ANTIVERT) 25 MG tablet Take 25 mg by mouth 2 (two) times daily as needed for dizziness.     [provider]  Melatonin 5 MG CAPS Take 5 mg by mouth at bedtime.     [provider]  methocarbamol (ROBAXIN) 500 MG tablet Take 1 tablet (500 mg total) by mouth every 8 (eight) hours as needed for muscle spasms. 12/29/18   Tylene Fantasia, PA-C  ondansetron (ZOFRAN) 4 MG tablet Take 1 tablet (4 mg total) by mouth every 8 (eight) hours as needed for nausea or vomiting. 11/09/18   Pabon, Marjory Lies, MD   Allergies  Allergen Reactions  . Shellfish Allergy Shortness Of Breath    respitatory distress   . Sodium Sulfite Shortness Of Breath    Severe Congestion that creates inability to breath  . Codeine Other (See Comments)    Per patient "it kept her up all night"    FAMILY HISTORY:  family history includes Cancer in her brother, father, and sister. SOCIAL HISTORY:  reports that she has never smoked. She has never used smokeless tobacco. She reports that she does not drink alcohol or use drugs.  REVIEW OF SYSTEMS:   Unable to assess pt receiving mechanical ventilation via chronic tracheostomy   SUBJECTIVE:  Unable to assess pt receiving mechanical ventilation via chronic tracheostomy   VITAL SIGNS: Temp:  [98.7 F (37.1 C)-100.1 F (37.8 C)] 99.3 F (  37.4 C) (01/24 0800) Pulse Rate:  [86-126] 91 (01/24 1200) Resp:  [15-30] 15 (01/24 1200) BP: (83-125)/(27-88) 125/56 (01/24 1200) SpO2:  [97 %-100 %] 100 % (01/24 1200) FiO2 (%):  [28 %-36 %] 28 % (01/24 1109) Weight:  [72.4 kg] 72.4 kg (01/24 0412)  PHYSICAL EXAMINATION: General: chronically ill appearing female, NAD requiring  mechanical ventilation  Neuro: alert, follows commands, PERRL  HEENT: #6 shiley dry and intact, no JVD  Cardiovascular: nsr, rrr, no R/G  Lungs: faint rhonchi throughout, even, non labored  Abdomen: +BS x4, soft, non tender, non distended  Musculoskeletal: normal bulk and tone, no edema  Skin: intact no rashes or lesions present   Recent Labs  Lab 03/20/19 1939 03/21/19 0324 03/22/19 0445  NA 139 139 139  K 4.2 4.3 3.2*  CL 101 106 107  CO2 30 28 26   BUN 16 15 11   CREATININE 0.74 0.87 0.89  GLUCOSE 168* 145* 98   Recent Labs  Lab 03/20/19 1939 03/21/19 0324 03/22/19 0445  HGB 14.2 10.8* 10.6*  HCT 47.1* 35.5* 33.7*  WBC 13.3* 12.2* 9.0  PLT 259 202 142*   CT HEAD WO CONTRAST  Result Date: 03/21/2019 CLINICAL DATA:  Subacute neuro deficit. Weakness, onset today. EXAM: CT HEAD WITHOUT CONTRAST TECHNIQUE: Contiguous axial images were obtained from the base of the skull through the vertex without intravenous contrast. COMPARISON:  Head CT yesterday at 2324 hour FINDINGS: Brain: No evidence of acute infarction, hemorrhage, hydrocephalus, extra-axial collection or mass lesion/mass effect. Stable degree of atrophy and chronic small vessel ischemia from yesterday. Vascular: Atherosclerosis of skullbase vasculature without hyperdense vessel or abnormal calcification. Skull: No fracture or focal lesion. Sinuses/Orbits: Minor mucosal thickening of the ethmoid air cells and right maxillary sinus. The mastoid air cells are clear. No acute orbital abnormality. Bilateral cataract resection. Other: None. IMPRESSION: 1. No change from CT yesterday. No hemorrhage or evidence of all vein ischemia. Consider further evaluation with MRI based on clinical concern. 2. Atrophy and chronic small vessel ischemia. Electronically Signed   By: Keith Rake M.D.   On: 03/21/2019 19:06   CT Head Wo Contrast  Result Date: 03/20/2019 CLINICAL DATA:  Altered mental status EXAM: CT HEAD WITHOUT CONTRAST  TECHNIQUE: Contiguous axial images were obtained from the base of the skull through the vertex without intravenous contrast. COMPARISON:  CT brain 07/11/2014 FINDINGS: Brain: No acute territorial infarction, hemorrhage, or intracranial mass. Mild atrophy. Moderate to marked periventricular, subcortical and deep white matter hypodensities. Stable ventricle size. Vascular: No hyperdense vessels. Vertebral and carotid vascular calcification Skull: Normal. Negative for fracture or focal lesion. Sinuses/Orbits: No acute finding. Other: None IMPRESSION: 1. No CT evidence for acute intracranial abnormality. 2. Atrophy and presumed chronic small vessel ischemic change of the white matter Electronically Signed   By: Donavan Foil M.D.   On: 03/20/2019 23:45   CT Angio Chest PE W and/or Wo Contrast  Result Date: 03/20/2019 CLINICAL DATA:  Weakness. Hypoxia. EXAM: CT ANGIOGRAPHY CHEST WITH CONTRAST TECHNIQUE: Multidetector CT imaging of the chest was performed using the standard protocol during bolus administration of intravenous contrast. Multiplanar CT image reconstructions and MIPs were obtained to evaluate the vascular anatomy. CONTRAST:  98mL OMNIPAQUE IOHEXOL 350 MG/ML SOLN COMPARISON:  Radiograph earlier this day. Most recent chest CT 12/25/2018 FINDINGS: Cardiovascular: There are no filling defects within the pulmonary arteries to suggest pulmonary embolus. Aortic atherosclerosis without aneurysm. No aortic dissection. Heart is normal in size. There are coronary artery calcifications. No pericardial  effusion. Mediastinum/Nodes: Dilated fluid-filled esophagus from the thoracic inlet to the gastroesophageal junction. No associated esophageal wall thickening. Tracheostomy tube tip the level of the clavicles. No visualized thyroid nodule. No enlarged mediastinal or hilar lymph nodes. Lungs/Pleura: Mild dependent opacities in both lower lobes. Right apical scarring. There scattered subpleural nodules in the right lung,  largest measuring 4 mm in the superior segment of the lower lobe, series 6, image 26. These are unchanged from prior exam, and reportedly stable since 2013. The previous right pneumothorax has resolved. No findings of pulmonary edema. No significant pleural fluid. Trachea and mainstem bronchi are patent. Upper Abdomen: No acute finding. Musculoskeletal: Subacute right rib fractures as seen on prior exam. Some interval incomplete fracture healing. No new osseous abnormality. Remote left lower rib fractures. Chronic right breast calcifications. Review of the MIP images confirms the above findings. IMPRESSION: 1. No pulmonary embolus. 2. Dilated fluid-filled esophagus from the thoracic inlet to the gastroesophageal junction, may be reflux or dysmotility. Achalasia also considered. 3. Mild dependent opacities in both lower lobes, likely atelectasis. Given esophageal dilatation, aspiration is also considered. 4. Subacute right rib fractures. Some interval incomplete fracture healing since October 2020 injury. 5. Stable right upper lobe scarring and benign pulmonary nodules. Aortic Atherosclerosis (ICD10-I70.0). Electronically Signed   By: Keith Rake M.D.   On: 03/20/2019 23:49   MR BRAIN WO CONTRAST  Result Date: 03/21/2019 CLINICAL DATA:  Neuro deficit, subacute. Additional history provided: EXAM: MRI HEAD WITHOUT CONTRAST TECHNIQUE: Multiplanar, multiecho pulse sequences of the brain and surrounding structures were obtained without intravenous contrast. COMPARISON:  Head CT 03/21/2019, brain MRI 07/20/2014 FINDINGS: Brain: There is moderate motion degradation of the coronal diffusion-weighted sequence. However, the axial diffusion-weighted sequence is of good quality and there is no evidence of acute infarct. Multiple additional sequences are significantly motion degraded. Most notably there is moderate motion degradation of the axial SWI sequence, moderate/severe motion degradation of the axial T2 FLAIR  sequence, and moderate/severe motion degradation of the axial T1 weighted sequence. Within described limitations, no intracranial mass is identified. No extra-axial fluid collection is identified. No midline shift. Severe patchy and confluent T2/FLAIR hyperintensity within the cerebral white matter is nonspecific, but likely reflects chronic small vessel ischemic disease. Chronic ischemic changes are also present within the bilateral basal ganglia and thalami as well as pons. No definite chronic intracranial blood products. Mild generalized parenchymal atrophy. Vascular: Flow voids maintained within the proximal large arterial vessels. Skull and upper cervical spine: No focal marrow lesion Sinuses/Orbits: Visualized orbits demonstrate no acute abnormality. Mild paranasal sinus mucosal thickening greatest within bilateral ethmoid air cells. Small right maxillary sinus air-fluid level. Trace fluid within bilateral mastoid air cells. Secretions within the posterior nasopharynx. IMPRESSION: The axial diffusion-weighted sequence is of good quality. No evidence of acute infarct. Multiple sequences are significantly motion degraded. Within this limitation, no evidence of acute intracranial abnormality. Advanced chronic small vessel ischemic disease, progressed from MRI 07/20/2014. Mild generalized parenchymal atrophy. Mild paranasal sinus mucosal thickening with right maxillary sinus air-fluid level. Electronically Signed   By: Kellie Simmering DO   On: 03/21/2019 23:01   DG Chest Port 1 View  Result Date: 03/22/2019 CLINICAL DATA:  Acute respiratory failure EXAM: PORTABLE CHEST 1 VIEW COMPARISON:  03/20/2019 FINDINGS: Tracheostomy remains in good position. Elevated right hemidiaphragm unchanged. No significant infiltrate or effusion. No change from the prior study. IMPRESSION: Tracheostomy remains in good position. Elevated right hemidiaphragm. No focal infiltrate. Electronically Signed   By: Franchot Gallo  M.D.   On:  03/22/2019 07:04   DG Chest Port 1 View  Result Date: 03/20/2019 CLINICAL DATA:  Hypoxia EXAM: PORTABLE CHEST 1 VIEW COMPARISON:  03/04/2019, 02/03/2019 FINDINGS: Tracheostomy tube is in place. Low lung volumes. Streaky opacity left base likely atelectasis. Stable cardiomediastinal silhouette. Aortic atherosclerosis. No pneumothorax. IMPRESSION: No active disease. Low lung volumes with streaky left basilar atelectasis Electronically Signed   By: Donavan Foil M.D.   On: 03/20/2019 20:04    ASSESSMENT / PLAN:  Acute on chronic hypercapnic hypoxic respiratory failure likely secondary to mucous plugging Mechanical ventilation via chronic tracheostomy  Hx: Vocal Cord Paralysis and Tracheal Stenosis requiring Chronic Tracheostomy Prn bronchodilator therapy  Pulmonary hygiene -off MV on trache collar -initiating PT/OT  Hypotension suspected secondary to hypovolemia vs. sepsis  Hx: Hypercholesteremia  Continuous telemetry monitoring  Fluid resuscitation to maintain map >65, if pt remains hypotensive will start peripheral levophed gtt  Improved with normal hemodynamics now   Leukocytosis etiology unclear possibly secondary to infection vs. reactive  Trend WBC and monitor fever curve  Will check PCT  Follow cultures  Will continue cefepime for now  -possible aspiration event -leukocytosis resolved   Type II Diabetes Mellitus  CBG's q4hrs  SSI   GERD IV protonix   Acute encephalopathy likely secondary to hypercapnia Avoid sedating medication  Frequent reorientation   Best Practice: VTE px: subq lovenox SUP: iv protonix  Diet: keep NPO for now and hold po outpatient medications, if pt continues to require mechanical ventilation in the next 24hrs will place NG tube   Critical care provider statement:    Critical care time (minutes):  33   Critical care time was exclusive of:  Separately billable procedures and  treating other patients   Critical care was necessary to treat or  prevent imminent or  life-threatening deterioration of the following conditions:  acute hypxemic respiraotory failure, multiple comorbid conditions   Critical care was time spent personally by me on the following  activities:  Development of treatment plan with patient or surrogate,  discussions with consultants, evaluation of patient's response to  treatment, examination of patient, obtaining history from patient or  surrogate, ordering and performing treatments and interventions, ordering  and review of laboratory studies and re-evaluation of patient's condition   I assumed direction of critical care for this patient from another  provider in my specialty: no

## 2019-03-22 NOTE — Progress Notes (Signed)
Patient ID: Angela David, female   DOB: Jul 11, 1940, 79 y.o.   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  She presented to [facility] ER on 01/22 via EMS from home with worsening generalized weakness throughout the day.  Per ER notes EMS reported upon their arrival at pts home her O2 sats were 87% on RA although pt denied shortness of breath, therefore 2L NRB placed over chronic tracheostomy with O2 sats increasing to 92%.  She does NOT require chronic home O2 at baseline.  In the ER lab results revealed glucose 168, wbc 13.3, lactic acid 0.9, vbg pH 7.23/pCO2 79, influenza pcr negative, and COVID-19 negative.  Patient apparently developed lethargy with repeat VBG revealing worsening hypercapnia.  Chest x-ray was concerning for left bibasilar atelectasis.  Patient required exchange of #6 uncuffed trach tube #6 cuffed trach for mechanical ventilation.  During the exchange it was noted patient had thick and secretions.CTA Chest negative for pulmonary embolism but revealed subacute right rib fractures, possible atelectasis, and benign pulmonary nodules.  CT Head negative for acute intracranial abnormality.  She received cefepime and vancomycin.  She was subsequently admitted to the ICU for additional workup and treatment.    Patient was treated for aspiration.  Apparently she is clinically has improved off of ventilation now.  She started physical therapy [name] today.  ICU attending called hospitalist for transition of care to the hospitalist service in the stepdown unit.

## 2019-03-23 DIAGNOSIS — R0902 Hypoxemia: Secondary | ICD-10-CM

## 2019-03-23 DIAGNOSIS — R4182 Altered mental status, unspecified: Secondary | ICD-10-CM

## 2019-03-23 DIAGNOSIS — R531 Weakness: Secondary | ICD-10-CM

## 2019-03-23 LAB — CBC WITH DIFFERENTIAL/PLATELET
Abs Immature Granulocytes: 0.1 10*3/uL — ABNORMAL HIGH (ref 0.00–0.07)
Basophils Absolute: 0 10*3/uL (ref 0.0–0.1)
Basophils Relative: 0 %
Eosinophils Absolute: 0 10*3/uL (ref 0.0–0.5)
Eosinophils Relative: 0 %
HCT: 33.5 % — ABNORMAL LOW (ref 36.0–46.0)
Hemoglobin: 10.5 g/dL — ABNORMAL LOW (ref 12.0–15.0)
Immature Granulocytes: 1 %
Lymphocytes Relative: 7 %
Lymphs Abs: 0.8 10*3/uL (ref 0.7–4.0)
MCH: 25.7 pg — ABNORMAL LOW (ref 26.0–34.0)
MCHC: 31.3 g/dL (ref 30.0–36.0)
MCV: 81.9 fL (ref 80.0–100.0)
Monocytes Absolute: 0.7 10*3/uL (ref 0.1–1.0)
Monocytes Relative: 6 %
Neutro Abs: 9.6 10*3/uL — ABNORMAL HIGH (ref 1.7–7.7)
Neutrophils Relative %: 86 %
Platelets: 149 10*3/uL — ABNORMAL LOW (ref 150–400)
RBC: 4.09 MIL/uL (ref 3.87–5.11)
RDW: 13.7 % (ref 11.5–15.5)
WBC: 11.2 10*3/uL — ABNORMAL HIGH (ref 4.0–10.5)
nRBC: 0 % (ref 0.0–0.2)

## 2019-03-23 LAB — GLUCOSE, CAPILLARY
Glucose-Capillary: 108 mg/dL — ABNORMAL HIGH (ref 70–99)
Glucose-Capillary: 127 mg/dL — ABNORMAL HIGH (ref 70–99)
Glucose-Capillary: 129 mg/dL — ABNORMAL HIGH (ref 70–99)
Glucose-Capillary: 68 mg/dL — ABNORMAL LOW (ref 70–99)
Glucose-Capillary: 79 mg/dL (ref 70–99)
Glucose-Capillary: 89 mg/dL (ref 70–99)

## 2019-03-23 LAB — BASIC METABOLIC PANEL
Anion gap: 9 (ref 5–15)
BUN: 8 mg/dL (ref 8–23)
CO2: 24 mmol/L (ref 22–32)
Calcium: 8.7 mg/dL — ABNORMAL LOW (ref 8.9–10.3)
Chloride: 106 mmol/L (ref 98–111)
Creatinine, Ser: 0.79 mg/dL (ref 0.44–1.00)
GFR calc Af Amer: >60 mL/min (ref >60)
GFR calc non Af Amer: >60 mL/min (ref >60)
Glucose, Bld: 142 mg/dL — ABNORMAL HIGH (ref 70–99)
Potassium: 4.1 mmol/L (ref 3.5–5.1)
Sodium: 139 mmol/L (ref 135–145)

## 2019-03-23 LAB — CULTURE, RESPIRATORY W GRAM STAIN: Culture: NORMAL

## 2019-03-23 LAB — PHOSPHORUS: Phosphorus: 3.1 mg/dL (ref 2.5–4.6)

## 2019-03-23 LAB — MAGNESIUM: Magnesium: 1.7 mg/dL (ref 1.7–2.4)

## 2019-03-23 MED ORDER — DEXTROSE 50 % IV SOLN
25.0000 g | Freq: Once | INTRAVENOUS | Status: AC
  Administered 2019-03-24: 25 g via INTRAVENOUS

## 2019-03-23 MED ORDER — LORAZEPAM 2 MG/ML IJ SOLN
0.5000 mg | Freq: Once | INTRAMUSCULAR | Status: AC
  Administered 2019-03-23: 01:00:00 0.5 mg via INTRAVENOUS
  Filled 2019-03-23: qty 1

## 2019-03-23 MED ORDER — VANCOMYCIN HCL IN DEXTROSE 1-5 GM/200ML-% IV SOLN
1000.0000 mg | INTRAVENOUS | Status: DC
  Administered 2019-03-24 – 2019-03-25 (×2): 1000 mg via INTRAVENOUS
  Filled 2019-03-23 (×3): qty 200

## 2019-03-23 MED ORDER — VANCOMYCIN HCL 1500 MG/300ML IV SOLN
1500.0000 mg | Freq: Once | INTRAVENOUS | Status: AC
  Administered 2019-03-23: 12:00:00 1500 mg via INTRAVENOUS
  Filled 2019-03-23: qty 300

## 2019-03-23 MED ORDER — DEXTROSE 50 % IV SOLN
INTRAVENOUS | Status: AC
  Filled 2019-03-23: qty 50

## 2019-03-23 MED ORDER — DEXMEDETOMIDINE HCL IN NACL 400 MCG/100ML IV SOLN
0.4000 ug/kg/h | INTRAVENOUS | Status: DC
  Administered 2019-03-23: 02:00:00 0.4 ug/kg/h via INTRAVENOUS
  Administered 2019-03-23: 06:00:00 0.7 ug/kg/h via INTRAVENOUS
  Filled 2019-03-23 (×2): qty 100

## 2019-03-23 MED ORDER — MAGNESIUM SULFATE 2 GM/50ML IV SOLN
2.0000 g | Freq: Once | INTRAVENOUS | Status: AC
  Administered 2019-03-23: 16:00:00 2 g via INTRAVENOUS
  Filled 2019-03-23: qty 50

## 2019-03-23 NOTE — Progress Notes (Signed)
Pharmacy Antibiotic Note  Angela David is a 79 y.o. female admitted on 03/20/2019 with generalized weakness, hypotension secondary to hypovolemia vs sepsis, acute hypoxic respiratory failure. 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 Pt is now weaned off mechanical ventilator. Respiratory cultures (tracheal aspirate) from 01/22 indicate growth of MRSA. One out of two BCx showed MRSA growth. However due to increased thick mucus secretions, MD recommended to keep the antibiotic therapy going. Pt was originally on cefepime 2g IV Q12H for sepsis. Pharmacy has been consulted for vancomycin dosing.  Plan: Initiate Vancomycin IV 1500mg  x 1 dose (loading dose, given today), followed by Vancomycin IV 1000mg  Q24H at 1000 on 01/26 Scr 0.79 AUC Goal 400-550 Calculated AUC 432  Height: 5\' 3"  (160 cm) Weight: 154 lb 1.6 oz (69.9 kg) IBW/kg (Calculated) : 52.4  Temp (24hrs), Avg:98.4 F (36.9 C), Min:97.4 F (36.3 C), Max:99.1 F (37.3 C)  Recent Labs  Lab 03/20/19 1939 03/21/19 0324 03/22/19 0445 03/23/19 0422  WBC 13.3* 12.2* 9.0 11.2*  CREATININE 0.74 0.87 0.89 0.79  LATICACIDVEN 0.9  --   --   --     Estimated Creatinine Clearance: 56.1 mL/min (by C-G formula based on SCr of 0.79 mg/dL).    Allergies  Allergen Reactions  . Shellfish Allergy Shortness Of Breath    respitatory distress   . Sodium Sulfite Shortness Of Breath    Severe Congestion that creates inability to breath  . Codeine Other (See Comments)    Per patient "it kept her up all night"    Antimicrobials this admission: Cefepime 2g IV 01/23 >> 01/25 Vancomycin 1g IV 01/23 >> 01/23 Vancomycin 1500mg  IV 01/25 >>   Dose adjustments this admission: NA  Microbiology results: 01/22 BCx: No growth 3 days 01/22  BCx: No growth 3 days 01/22 Respiratory Culture (Tracheal Aspirate): Rare MRSA (finalized 01/25) 01/23 Respiratory Culture (Tracheal Aspirate): Rare consistent with normal respiratory flora (finalized 01/25) 01/22 SARS Coronavirus by PCR: Negative 01/22 Influenza A/B by PCR: Negative  Thank you for allowing pharmacy to be a part of this patient's care.  Roanna Banning 03/23/2019 12:23 PM

## 2019-03-23 NOTE — Progress Notes (Signed)
OT Cancellation Note  Patient Details Name: Angela David MRN: [id number] DOB: December 24, 1940   Cancelled Treatment:    Reason Eval/Treat Not Completed: Other (comment). Consult received, chart reviewed. Per RN, pt anxious last night and was administered ativan and then placed on precedex drip, and pt is able to awaken, but very drowsy. OT to hold until patient is more appropriate to participate in OT evaluation.  Jeni Salles, MPH, MS, OTR/L ascom [phone] 03/23/19, 9:00 AM

## 2019-03-23 NOTE — Progress Notes (Signed)
PT Cancellation Note  Patient Details Name: Angela David MRN: [id number] DOB: 03-29-1940   Cancelled Treatment:    Reason Eval/Treat Not Completed: Other (comment)(Per RN, pt anxious last night and was administered ativan and then placed on precedex drip, and pt is able to awaken, but very drowsy. PT to hold until patient is more appropriate to participate in PT evaluation.)   Lieutenant Diego PT, DPT 8:53 AM,03/23/19

## 2019-03-23 NOTE — Evaluation (Signed)
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  She presented to [facility] ER on 01/22 via EMS, admitted with generalized weakness, hypotension secondary to hypovolemia vs. sepsis, acute on chronic hypercapnic hypoxic respiratory failure likely secondary to mucous plugging requiring mechanical ventilation via chronic tracheostomy. work up showed worsening hypercapnia and L bibasilar atelectasis, as well as subacute R rib fractures, admitted to ICU and ventilated from 1/22/-1/23.    Clinical Impression  Patient alert, oriented, very pleasant. Whispers/primarily communicates with mouthing words due to chronic trach/vocal cord paralysis. The patient stated she lives with her husband who is available 24/7 to assist as needed, mod I for ambulation and mostly for bathing/dressing (husband washes her back). Endorses recent fall that led to rib fractures.  The patient was able to perform several exercises during the session with verbal/visual cueing. Did report some fatigue after heel slides but eager to perform. Supine to sit with HOB elevated, minA for weight shift towards EOB. The patient was able to sit for several minutes with fair balance. Sit <> stand with 2 person hand held assist and stand pivot to chair. SpO2 monitored throughout, >90% with trach collar in place.  Overall the patient demonstrated deficits (see "PT Problem List") that impede the  patient's functional abilities, safety, and mobility and would benefit from skilled PT intervention. Pt eager to return home and adamant her husband would be able to provide assistance as needed, plan is HHPT.      Follow Up Recommendations Home health PT    Equipment Recommendations  Other (comment)(Pt has RW and SPC at home, and wheelchair)    Recommendations for Other Services       Precautions / Restrictions Precautions Precautions: Fall Restrictions Weight Bearing Restrictions: No      Mobility  Bed Mobility Overal bed mobility: Needs Assistance Bed Mobility: Supine to Sit     Supine to sit: HOB elevated;Min assist     General bed mobility comments: handheld assist provided for weight shift  Transfers Overall transfer level: Needs assistance Equipment used: 2 person hand held assist Transfers: Sit to/from Stand;Stand Pivot Transfers Sit to Stand: Min assist;+2 safety/equipment;From elevated surface Stand pivot transfers: +2 safety/equipment;Min guard       General transfer comment: handheld assist x2 for safety  Ambulation/Gait             General Gait Details: deferred  Stairs            Wheelchair Mobility    Modified Rankin (Stroke Patients Only)       Balance Overall balance assessment: Needs assistance Sitting-balance support: Single extremity supported Sitting balance-Leahy Scale: Fair       Standing balance-Leahy Scale: Fair                               Pertinent Vitals/Pain Pain Assessment: No/denies pain    Home Living Family/patient expects to be discharged to:: Private residence Living Arrangements: Spouse/significant other Available Help at Discharge: Family;Available 24 hours/day Type of  Home: House Home Access: Miranda: One level Home Equipment: Perry - 2 wheels;Wheelchair - manual;Cane - single point;Grab bars - toilet;Other (comment)(adjustable bed base)      Prior  Function Level of Independence: Independent with assistive device(s)(husband does help wash her back)         Comments: intermittently used her cane     Hand Dominance   Dominant Hand: Right    Extremity/Trunk Assessment   Upper Extremity Assessment Upper Extremity Assessment: Defer to OT evaluation    Lower Extremity Assessment Lower Extremity Assessment: (Pt able to lift all extremities against gravity)    Cervical / Trunk Assessment Cervical / Trunk Assessment: Normal  Communication   Communication: Other (comment)  Cognition Arousal/Alertness: Awake/alert Behavior During Therapy: WFL for tasks assessed/performed Overall Cognitive Status: Within Functional Limits for tasks assessed                                 General Comments: Pt with paralyzed vocal cords and chronic trach, whispers/mouths answers      General Comments      Exercises General Exercises - Lower Extremity Ankle Circles/Pumps: AROM;Strengthening;Both;10 reps Long Arc Quad: AROM;Strengthening;Both;10 reps Heel Slides: AROM;Strengthening;Both;10 reps   Assessment/Plan    PT Assessment Patient needs continued PT services  PT Problem List Decreased strength;Decreased mobility;Decreased activity tolerance;Decreased balance       PT Treatment Interventions DME instruction;Therapeutic exercise;Gait training;Balance training;Neuromuscular re-education;Functional mobility training;Therapeutic activities;Patient/family education    PT Goals (Current goals can be found in the Care Plan section)  Acute Rehab PT Goals Patient Stated Goal: to go home PT Goal Formulation: With patient Time For Goal Achievement: 04/06/19 Potential to Achieve Goals: Good    Frequency Min 2X/week   Barriers to discharge        Co-evaluation               AM-PAC PT "6 Clicks" Mobility  Outcome Measure Help needed turning from your back to your side while in a flat bed without using bedrails?:  A Little Help needed moving from lying on your back to sitting on the side of a flat bed without using bedrails?: A Little Help needed moving to and from a bed to a chair (including a wheelchair)?: A Little Help needed standing up from a chair using your arms (e.g., wheelchair or bedside chair)?: A Little Help needed to walk in hospital room?: A Little Help needed climbing 3-5 steps with a railing? : A Lot 6 Click Score: 17    End of Session Equipment Utilized During Treatment: Oxygen(trach collar) Activity Tolerance: Patient tolerated treatment well Patient left: in chair;with call bell/phone within reach Nurse Communication: Mobility status PT Visit Diagnosis: Muscle weakness (generalized) (M62.81);Other abnormalities of gait and mobility (R26.89);Difficulty in walking, not elsewhere classified (R26.2)    Time: [id number] PT Time Calculation (min) (ACUTE ONLY): 36 min   Charges:   PT Evaluation $PT Eval Moderate Complexity: 1 Mod PT Treatments $Therapeutic Exercise: 23-37 mins       Lieutenant Diego PT, DPT 3:09 PM,03/23/19

## 2019-03-23 NOTE — Progress Notes (Signed)
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 She presented to [facility] ER on 01/22 via EMS from home with worsening generalized weakness throughout the day. Per ER notes EMS reported upon their arrival at pts home her O2 sats were 87% on RA although pt denied shortness of breath, therefore 2L NRB placed over chronic tracheostomy with O2 sats increasing to 92%. She does NOT require chronic home O2 at baseline. Found to have thick mucus plugging.  Tracheostomy tube was changed and she was initially placed on mechanical ventilation.  Weaned off pretty quickly. CTA chest was negative for PE. Respiratory cultures with MRSA-cefepime discontinued and she will complete antibiotics with vancomycin.  Subjective: Patient appears lethargic, following simple commands.  Overnight nursing concern about thick tracheal secretions.  She was again placed on Precedex overnight for agitation.  Precedex was stopped at 11 AM today.  Assessment & Plan:   Active Problems:   Acute respiratory failure with hypercapnia (HCC)   Hypotension   Acute encephalopathy  Acute on chronic hypercapnic hypoxic respiratory failure.  Likely secondary to mucous plugging requiring mechanical ventilation initially.  Patient has an history of vocal cord paralysis and tracheal stenosis and chronic tracheostomy. Appears back to her baseline although still requiring some oxygen through  tracheostomy collar.  -Continue with bronchodilators. -Continue pulmonary hygiene. -Try initiating PT/OT-patient unable to work today.  Leukocytosis.  Unclear etiology infection versus reactive. -Patient remained afebrile. -Respiratory cultures grew MRSA. -Patient was initially treated with cefepime and vancomycin now on vancomycin according to respiratory culture results. -Continue vancomycin.  Intermittent agitation.  Patient appears calm when seen this morning.  History of intermittent agitation overnight requiring Precedex infusion. -Continue to monitor. -Delirium precautions  Type 2 diabetes.  Well-controlled with A1c of 6.6.  CBG within goal. -Continue SSI as needed.  GERD. -Continue PPI.  Objective: Vitals:   03/23/19 1100 03/23/19 1200 03/23/19 1300 03/23/19 1400  BP: (!) 115/52 122/61 124/61 (!) 129/53  Pulse: 69 81 80 79  Resp: (!) 24 (!) 25 (!) 29 (!) 28  Temp:  98.6 F (37 C)    TempSrc:  Axillary    SpO2: 96% 96% 96% 97%  Weight:      Height:        Intake/Output Summary (Last 24 hours) at 03/23/2019 1501 Last data filed at 03/23/2019 1235 Gross per 24 hour  Intake 2269.51 ml  Output 1500 ml  Net 769.51 ml   Filed Weights   03/21/19 0347 03/22/19 0412 03/23/19 0115  Weight: 66.1 kg 72.4 kg 69.9 kg    Examination:  General exam: Chronically ill-appearing lady with tracheostomy collar, in no acute distress. Respiratory system: Clear to auscultation. Respiratory effort normal. Cardiovascular system: S1 & S2 heard, RRR. No JVD, murmurs, rubs, gallops or clicks. Gastrointestinal system: Soft, nontender, nondistended, bowel sounds positive. Central nervous system: Alert, allowing simple command appears lethargic. No focal neurological deficits. Extremities: No edema, no cyanosis, pulses intact and symmetrical. Psychiatry: Judgement and insight appear impaired.   DVT prophylaxis: Lovenox Code Status: Full Family Communication: No  family at  bedside Disposition Plan: Pending improvement, will go back home with home health.  Consultants:   PCCM  Procedures:  Antimicrobials:  Vancomycin  Data Reviewed: I have personally reviewed following labs and imaging studies  CBC: Recent Labs  Lab 03/20/19 1939 03/21/19 0324 03/22/19 0445 03/23/19 0422  WBC 13.3* 12.2* 9.0 11.2*  NEUTROABS 10.7*  --  7.0 9.6*  HGB 14.2 10.8* 10.6* 10.5*  HCT 47.1* 35.5* 33.7* 33.5*  MCV 82.2 82.9 81.2 81.9  PLT 259 202 142* [id number]*   Basic Metabolic Panel: Recent Labs  Lab 03/20/19 1939 03/21/19 0324 03/22/19 0445 03/23/19 0422  NA 139 139 139 139  K 4.2 4.3 3.2* 4.1  CL 101 106 107 106  CO2 30 28 26 24   GLUCOSE 168* 145* 98 142*  BUN 16 15 11 8   CREATININE 0.74 0.87 0.89 0.79  CALCIUM 10.0 8.5* 8.5* 8.7*  MG  --  1.5* 1.8 1.7  PHOS  --  2.9  --  3.1   GFR: Estimated Creatinine Clearance: 56.1 mL/min (by C-G formula based on SCr of 0.79 mg/dL). Liver Function Tests: Recent Labs  Lab 03/20/19 1939  AST 24  ALT 19  ALKPHOS 87  BILITOT 0.7  PROT 8.7*  ALBUMIN 3.8   No results for input(s): LIPASE, AMYLASE in the last 168 hours. No results for input(s): AMMONIA in the last 168 hours. Coagulation Profile: Recent Labs  Lab 03/20/19 1939  INR 1.1   Cardiac Enzymes: No results for input(s): CKTOTAL, CKMB, CKMBINDEX, TROPONINI in the last 168 hours. BNP (last 3 results) No results for input(s): PROBNP in the last 8760 hours. HbA1C: Recent Labs    03/21/19 0324  HGBA1C 6.6*   CBG: Recent Labs  Lab 03/22/19 1924 03/22/19 2345 03/23/19 0423 03/23/19 0722 03/23/19 1140  GLUCAP 92 107* 127* 129* 108*   Lipid Profile: No results for input(s): CHOL, HDL, LDLCALC, TRIG, CHOLHDL, LDLDIRECT in the last 72 hours. Thyroid Function Tests: No results for input(s): TSH, T4TOTAL, FREET4, T3FREE, THYROIDAB in the last 72 hours. Anemia Panel: No results for input(s): VITAMINB12, FOLATE, FERRITIN, TIBC, IRON, RETICCTPCT in  the last 72 hours. Sepsis Labs: Recent Labs  Lab 03/20/19 1939 03/21/19 0324  PROCALCITON  --  <0.10  LATICACIDVEN 0.9  --     Recent Results (from the past 240 hour(s))  Blood Culture (routine x 2)     Status: None (Preliminary result)   Collection Time: 03/20/19  7:39 PM   Specimen: BLOOD  Result Value Ref Range Status   Specimen Description BLOOD BLOOD LEFT FOREARM  Final   Special Requests   Final    BOTTLES DRAWN AEROBIC AND ANAEROBIC Blood Culture adequate volume   Culture   Final    NO GROWTH 3 DAYS Performed at [facility], [address], [place], [place] [address]    Report Status PENDING  Incomplete  Blood Culture (routine x 2)     Status: None (Preliminary result)   Collection Time: 03/20/19  7:39 PM   Specimen: BLOOD  Result Value Ref Range Status   Specimen Description BLOOD LEFT ANTECUBITAL  Final   Special Requests   Final    BOTTLES DRAWN AEROBIC AND ANAEROBIC Blood Culture adequate volume   Culture   Final    NO GROWTH 3 DAYS Performed at [facility], [address] [place], [place] [address]    Report Status PENDING  Incomplete  Respiratory Panel by RT PCR (Flu A&B, Covid) -  Nasopharyngeal Swab     Status: None   Collection Time: 03/20/19  7:39 PM   Specimen: Nasopharyngeal Swab  Result Value Ref Range Status   SARS Coronavirus 2 by RT PCR NEGATIVE NEGATIVE Final    Comment: (NOTE) SARS-CoV-2 target nucleic acids are NOT DETECTED. The SARS-CoV-2 RNA is generally detectable in upper respiratoy specimens during the acute phase of infection. The lowest concentration of SARS-CoV-2 viral copies this assay can detect is 131 copies/mL. A negative result does not preclude SARS-Cov-2 infection and should not be used as the sole basis for treatment or other patient management decisions. A negative result may occur with  improper specimen collection/handling, submission of specimen other than nasopharyngeal swab, presence of viral  mutation(s) within the areas targeted by this assay, and inadequate number of viral copies (<131 copies/mL). A negative result must be combined with clinical observations, patient history, and epidemiological information. The expected result is Negative. Fact Sheet for Patients:  PinkCheek.be Fact Sheet for Healthcare Providers:  GravelBags.it This test is not yet ap proved or cleared by the [place] FDA and  has been authorized for detection and/or diagnosis of SARS-CoV-2 by FDA under an Emergency Use Authorization (EUA). This EUA will remain  in effect (meaning this test can be used) for the duration of the COVID-19 declaration under Section 564(b)(1) of the Act, 21 U.S.C. section 360bbb-3(b)(1), unless the authorization is terminated or revoked sooner.    Influenza A by PCR NEGATIVE NEGATIVE Final   Influenza B by PCR NEGATIVE NEGATIVE Final    Comment: (NOTE) The Xpert Xpress SARS-CoV-2/FLU/RSV assay is intended as an aid in  the diagnosis of influenza from Nasopharyngeal swab specimens and  should not be used as a sole basis for treatment. Nasal washings and  aspirates are unacceptable for Xpert Xpress SARS-CoV-2/FLU/RSV  testing. Fact Sheet for Patients: PinkCheek.be Fact Sheet for Healthcare Providers: GravelBags.it This test is not yet approved or cleared by the [place] FDA and  has been authorized for detection and/or diagnosis of SARS-CoV-2 by  FDA under an Emergency Use Authorization (EUA). This EUA will remain  in effect (meaning this test can be used) for the duration of the  Covid-19 declaration under Section 564(b)(1) of the Act, 21  U.S.C. section 360bbb-3(b)(1), unless the authorization is  terminated or revoked. Performed at [facility], [address] [place], [place] [address]   Culture, respiratory     Status: None    Collection Time: 03/20/19  9:45 PM   Specimen: Tracheal Aspirate  Result Value Ref Range Status   Specimen Description   Final    TRACHEAL ASPIRATE Performed at [facility], [address] [place], [place] [address]    Special Requests   Final    NONE Performed at [facility], [address] [place], [place] [address]    Gram Stain   Final    ABUNDANT WBC PRESENT, PREDOMINANTLY PMN RARE GRAM POSITIVE COCCI Performed at [facility], [place] [address] [place], [place] [address]    Culture RARE METHICILLIN RESISTANT STAPHYLOCOCCUS AUREUS  Final   Report Status 03/23/2019 FINAL  Final   Organism ID, Bacteria METHICILLIN RESISTANT STAPHYLOCOCCUS AUREUS  Final      Susceptibility   Methicillin resistant staphylococcus aureus - MIC*    CIPROFLOXACIN >=8 RESISTANT Resistant     ERYTHROMYCIN >=8 RESISTANT Resistant     GENTAMICIN <=0.5 SENSITIVE Sensitive     OXACILLIN >=4 RESISTANT Resistant     TETRACYCLINE <=  1 SENSITIVE Sensitive     VANCOMYCIN 1 SENSITIVE Sensitive     TRIMETH/SULFA <=10 SENSITIVE Sensitive     CLINDAMYCIN RESISTANT Resistant     RIFAMPIN <=0.5 SENSITIVE Sensitive     Inducible Clindamycin POSITIVE Resistant     * RARE METHICILLIN RESISTANT STAPHYLOCOCCUS AUREUS  Culture, respiratory (tracheal aspirate)     Status: None   Collection Time: 03/21/19  4:43 AM   Specimen: Tracheal Aspirate; Respiratory  Result Value Ref Range Status   Specimen Description   Final    TRACHEAL ASPIRATE Performed at [facility], [address] [place], [place] [address]    Special Requests   Final    NONE Performed at [facility], [address] [place], [place] [address]    Gram Stain   Final    ABUNDANT WBC PRESENT, PREDOMINANTLY PMN NO ORGANISMS SEEN    Culture   Final    RARE Consistent with normal respiratory flora. Performed at [facility], [place] [address] [place], [place] [address]    Report Status 03/23/2019  FINAL  Final     Radiology Studies: CT HEAD WO CONTRAST  Result Date: 03/21/2019 CLINICAL DATA:  Subacute neuro deficit. Weakness, onset today. EXAM: CT HEAD WITHOUT CONTRAST TECHNIQUE: Contiguous axial images were obtained from the base of the skull through the vertex without intravenous contrast. COMPARISON:  Head CT yesterday at 2324 hour FINDINGS: Brain: No evidence of acute infarction, hemorrhage, hydrocephalus, extra-axial collection or mass lesion/mass effect. Stable degree of atrophy and chronic small vessel ischemia from yesterday. Vascular: Atherosclerosis of skullbase vasculature without hyperdense vessel or abnormal calcification. Skull: No fracture or focal lesion. Sinuses/Orbits: Minor mucosal thickening of the ethmoid air cells and right maxillary sinus. The mastoid air cells are clear. No acute orbital abnormality. Bilateral cataract resection. Other: None. IMPRESSION: 1. No change from CT yesterday. No hemorrhage or evidence of all vein ischemia. Consider further evaluation with MRI based on clinical concern. 2. Atrophy and chronic small vessel ischemia. Electronically Signed   By: Keith Rake M.D.   On: 03/21/2019 19:06   MR BRAIN WO CONTRAST  Result Date: 03/21/2019 CLINICAL DATA:  Neuro deficit, subacute. Additional history provided: EXAM: MRI HEAD WITHOUT CONTRAST TECHNIQUE: Multiplanar, multiecho pulse sequences of the brain and surrounding structures were obtained without intravenous contrast. COMPARISON:  Head CT 03/21/2019, brain MRI 07/20/2014 FINDINGS: Brain: There is moderate motion degradation of the coronal diffusion-weighted sequence. However, the axial diffusion-weighted sequence is of good quality and there is no evidence of acute infarct. Multiple additional sequences are significantly motion degraded. Most notably there is moderate motion degradation of the axial SWI sequence, moderate/severe motion degradation of the axial T2 FLAIR sequence, and moderate/severe  motion degradation of the axial T1 weighted sequence. Within described limitations, no intracranial mass is identified. No extra-axial fluid collection is identified. No midline shift. Severe patchy and confluent T2/FLAIR hyperintensity within the cerebral white matter is nonspecific, but likely reflects chronic small vessel ischemic disease. Chronic ischemic changes are also present within the bilateral basal ganglia and thalami as well as pons. No definite chronic intracranial blood products. Mild generalized parenchymal atrophy. Vascular: Flow voids maintained within the proximal large arterial vessels. Skull and upper cervical spine: No focal marrow lesion Sinuses/Orbits: Visualized orbits demonstrate no acute abnormality. Mild paranasal sinus mucosal thickening greatest within bilateral ethmoid air cells. Small right maxillary sinus air-fluid level. Trace fluid within bilateral mastoid air cells. Secretions within the posterior nasopharynx. IMPRESSION: The axial diffusion-weighted sequence  is of good quality. No evidence of acute infarct. Multiple sequences are significantly motion degraded. Within this limitation, no evidence of acute intracranial abnormality. Advanced chronic small vessel ischemic disease, progressed from MRI 07/20/2014. Mild generalized parenchymal atrophy. Mild paranasal sinus mucosal thickening with right maxillary sinus air-fluid level. Electronically Signed   By: Kellie Simmering DO   On: 03/21/2019 23:01   DG Chest Port 1 View  Result Date: 03/22/2019 CLINICAL DATA:  Acute respiratory failure EXAM: PORTABLE CHEST 1 VIEW COMPARISON:  03/20/2019 FINDINGS: Tracheostomy remains in good position. Elevated right hemidiaphragm unchanged. No significant infiltrate or effusion. No change from the prior study. IMPRESSION: Tracheostomy remains in good position. Elevated right hemidiaphragm. No focal infiltrate. Electronically Signed   By: Franchot Gallo M.D.   On: 03/22/2019 07:04    Scheduled  Meds: . Chlorhexidine Gluconate Cloth  6 each Topical Daily  . cycloSPORINE  1 drop Both Eyes BID  . enoxaparin (LOVENOX) injection  40 mg Subcutaneous Q24H  . insulin aspart  0-9 Units Subcutaneous Q4H  . pantoprazole (PROTONIX) IV  40 mg Intravenous QHS   Continuous Infusions: . sodium chloride    . dexmedetomidine (PRECEDEX) IV infusion Stopped (03/23/19 1130)  . lactated ringers 75 mL/hr at 03/23/19 1235  . magnesium sulfate bolus IVPB    . magnesium sulfate bolus IVPB    . [START ON 03/24/2019] vancomycin       LOS: 2 days   Time spent: 45 minutes.  I reviewed her chart.  Lorella Nimrod, MD [facility] Pager [phone]  If 7PM-7AM, please contact night-coverage [email or web address] Password [facility] 03/23/2019, 3:01 PM   This record has been created using Dragon voice recognition software. Errors have been sought and corrected,but may not always be located. Such creation errors do not reflect on the standard of care.

## 2019-03-24 ENCOUNTER — Inpatient Hospital Stay (HOSPITAL_COMMUNITY)
Admit: 2019-03-24 | Discharge: 2019-03-24 | Disposition: A | Discharge status: Home / Self Care | Payer: Medicare Other | Attending: Critical Care Medicine | Admitting: Critical Care Medicine

## 2019-03-24 ENCOUNTER — Inpatient Hospital Stay: Payer: Medicare Other

## 2019-03-24 DIAGNOSIS — J9601 Acute respiratory failure with hypoxia: Secondary | ICD-10-CM

## 2019-03-24 DIAGNOSIS — J9602 Acute respiratory failure with hypercapnia: Secondary | ICD-10-CM

## 2019-03-24 LAB — CBC
HCT: 35.7 % — ABNORMAL LOW (ref 36.0–46.0)
Hemoglobin: 11.4 g/dL — ABNORMAL LOW (ref 12.0–15.0)
MCH: 25.6 pg — ABNORMAL LOW (ref 26.0–34.0)
MCHC: 31.9 g/dL (ref 30.0–36.0)
MCV: 80.2 fL (ref 80.0–100.0)
Platelets: 165 10*3/uL (ref 150–400)
RBC: 4.45 MIL/uL (ref 3.87–5.11)
RDW: 13.3 % (ref 11.5–15.5)
WBC: 9.8 10*3/uL (ref 4.0–10.5)
nRBC: 0 % (ref 0.0–0.2)

## 2019-03-24 LAB — BASIC METABOLIC PANEL
Anion gap: 14 (ref 5–15)
BUN: 6 mg/dL — ABNORMAL LOW (ref 8–23)
CO2: 23 mmol/L (ref 22–32)
Calcium: 9.3 mg/dL (ref 8.9–10.3)
Chloride: 102 mmol/L (ref 98–111)
Creatinine, Ser: 0.65 mg/dL (ref 0.44–1.00)
GFR calc Af Amer: >60 mL/min (ref >60)
GFR calc non Af Amer: >60 mL/min (ref >60)
Glucose, Bld: 97 mg/dL (ref 70–99)
Potassium: 3.7 mmol/L (ref 3.5–5.1)
Sodium: 139 mmol/L (ref 135–145)

## 2019-03-24 LAB — GLUCOSE, CAPILLARY
Glucose-Capillary: 102 mg/dL — ABNORMAL HIGH (ref 70–99)
Glucose-Capillary: 102 mg/dL — ABNORMAL HIGH (ref 70–99)
Glucose-Capillary: 122 mg/dL — ABNORMAL HIGH (ref 70–99)
Glucose-Capillary: 125 mg/dL — ABNORMAL HIGH (ref 70–99)
Glucose-Capillary: 153 mg/dL — ABNORMAL HIGH (ref 70–99)
Glucose-Capillary: 93 mg/dL (ref 70–99)

## 2019-03-24 LAB — ECHOCARDIOGRAM COMPLETE
Height: 63 in
Weight: 2500.9 [oz_av]

## 2019-03-24 LAB — MAGNESIUM: Magnesium: 1.4 mg/dL — ABNORMAL LOW (ref 1.7–2.4)

## 2019-03-24 LAB — PHOSPHORUS: Phosphorus: 1.8 mg/dL — ABNORMAL LOW (ref 2.5–4.6)

## 2019-03-24 MED ORDER — POTASSIUM PHOSPHATES 15 MMOLE/5ML IV SOLN
20.0000 mmol | Freq: Once | INTRAVENOUS | Status: AC
  Administered 2019-03-24: 09:00:00 20 mmol via INTRAVENOUS
  Filled 2019-03-24: qty 6.67

## 2019-03-24 MED ORDER — FOLIC ACID 1 MG PO TABS
1.0000 mg | ORAL_TABLET | Freq: Every day | ORAL | Status: DC
  Administered 2019-03-25 – 2019-03-26 (×2): 1 mg via ORAL
  Filled 2019-03-24 (×2): qty 1

## 2019-03-24 MED ORDER — MELATONIN 5 MG PO TABS
5.0000 mg | ORAL_TABLET | Freq: Every day | ORAL | Status: DC
  Administered 2019-03-24 – 2019-03-25 (×2): 5 mg via ORAL
  Filled 2019-03-24 (×2): qty 1

## 2019-03-24 MED ORDER — BUSPIRONE HCL 15 MG PO TABS
30.0000 mg | ORAL_TABLET | Freq: Two times a day (BID) | ORAL | Status: DC
  Administered 2019-03-24 – 2019-03-26 (×5): 30 mg via ORAL
  Filled 2019-03-24: qty 2
  Filled 2019-03-24 (×2): qty 3
  Filled 2019-03-24 (×3): qty 2
  Filled 2019-03-24: qty 3
  Filled 2019-03-24 (×2): qty 2
  Filled 2019-03-24: qty 3

## 2019-03-24 MED ORDER — FUROSEMIDE 10 MG/ML IJ SOLN
40.0000 mg | Freq: Once | INTRAMUSCULAR | Status: AC
  Administered 2019-03-24: 06:00:00 40 mg via INTRAVENOUS
  Filled 2019-03-24: qty 4

## 2019-03-24 MED ORDER — MAGNESIUM SULFATE 4 GM/100ML IV SOLN
4.0000 g | Freq: Once | INTRAVENOUS | Status: AC
  Administered 2019-03-24: 09:00:00 4 g via INTRAVENOUS
  Filled 2019-03-24: qty 100

## 2019-03-24 MED ORDER — ROPINIROLE HCL 0.25 MG PO TABS
0.5000 mg | ORAL_TABLET | Freq: Every day | ORAL | Status: DC
  Administered 2019-03-24 – 2019-03-25 (×2): 0.5 mg via ORAL
  Filled 2019-03-24: qty 2
  Filled 2019-03-24: qty 1
  Filled 2019-03-24 (×2): qty 2
  Filled 2019-03-24: qty 1

## 2019-03-24 MED ORDER — PANTOPRAZOLE SODIUM 40 MG PO TBEC
40.0000 mg | DELAYED_RELEASE_TABLET | Freq: Every day | ORAL | Status: DC
  Administered 2019-03-24 – 2019-03-25 (×2): 40 mg via ORAL
  Filled 2019-03-24 (×2): qty 1

## 2019-03-24 MED ORDER — SODIUM CHLORIDE 0.9 % IV SOLN: 4.0000 g | Freq: Once | INTRAVENOUS | Status: DC

## 2019-03-24 NOTE — Evaluation (Signed)
Clinical/Bedside Swallow Evaluation Patient Details  Name: Janaisa Bieser MRN: [id number] Date of Birth: 21-Dec-1940  Today's Date: 03/24/2019 Time: SLP Start Time (ACUTE ONLY): 51 SLP Stop Time (ACUTE ONLY): 1020 SLP Time Calculation (min) (ACUTE ONLY): 45 min  Past Medical History:  Past Medical History:  Diagnosis Date  . Breast cancer ([place]) 2002   right breast chemo and radiation  . Cancer [facility]) 2002   right breast  . Diabetes mellitus without complication ([place])   . Diabetic peripheral neuropathy ([place]) 08/28/2016  . GERD (gastroesophageal reflux disease)   . Glaucoma   . High cholesterol   . History of colon polyps   . Peripheral neuropathy 08/28/2016  . Personal history of chemotherapy 2002   BREAST CA  . Personal history of malignant neoplasm of breast   . Personal history of radiation therapy 2002   BREAST CA  . Vocal cord paralysis 2012   Past Surgical History:  Past Surgical History:  Procedure Laterality Date  . ABDOMINAL HYSTERECTOMY    . BREAST BIOPSY Right 2008   neg  . BREAST EXCISIONAL BIOPSY Right 2002   positive  . BREAST LUMPECTOMY Right 2002   chemo and radiation  . BREAST SURGERY Right 2002   LUMPECTOMY, radiation, chemotherapy  . CHOLECYSTECTOMY N/A 12/10/2017   Procedure: LAPAROSCOPIC CHOLECYSTECTOMY;  Surgeon: Benjamine Sprague, DO;  Location: [facility] ORS;  Service: General;  Laterality: N/A;  . COLON RESECTION Right 11/06/2018   Procedure: HAND ASSISTED LAPAROSCOPIC COLON RESECTION, RIGHT;  Surgeon: Jules Husbands, MD;  Location: [facility] ORS;  Service: General;  Laterality: Right;  . COLONOSCOPY  2009,12/30/2013  . COLONOSCOPY WITH PROPOFOL N/A 04/16/2018   Tubulovillous polyp proximal transverse colon, large sessile polyp, proximal transverse colon;  Surgeon: Robert Bellow, MD;  Location: [facility] ENDOSCOPY;  Service: Endoscopy;  Laterality: N/A;  . ESOPHAGOGASTRODUODENOSCOPY (EGD) WITH PROPOFOL N/A 10/14/2018   Procedure: ESOPHAGOGASTRODUODENOSCOPY (EGD)  WITH PROPOFOL;  Surgeon: Lucilla Lame, MD;  Location: [facility] ENDOSCOPY;  Service: Endoscopy;  Laterality: N/A;  . HYDROCELE EXCISION / REPAIR    . TRACHEAL SURGERY  2013  . TRACHEOSTOMY N/A    Dr Kathyrn Sheriff   HPI:  Pt is a 79 yo female with tracheal stenosis requiring chronic tracheostomy admitted with generalized weakness, hypotension secondary to hypovolemia vs. sepsis, acute on chronic hypercapnic hypoxic respiratory failure likely secondary to mucous plugging requiring mechanical ventilation via chronic tracheostomy. 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 Pt had a Barium Study (GI) completed on 10/2018 which revealed "dilated, patulous esophagus; hiatal hernia; Esophageal dysmotility with intermittent tertiary contractions".  Pt was admitted for Acute respiratory failure due to possible mucus plugging with atelectasis; chronic RUL inflitrate with mild improvement. Pt required exchange of #6 uncuffed trach to #6 cuffed trach for mechanical ventilation during this admission. During exchange of tracheostomy pt noted to have moderate amount of thick tan secretions requiring suctioning. Pt stated she eats a Regular diet at home -- some foods are easier to eat than others. She denies any difficulty swallowing. She states she does not use a PMV but has tried in the past but w/ poor tolerance for it.    Assessment / Plan /  Recommendation Clinical Impression  Pt appears to present w/ grossly adequate oropharyngeal swallow function and toleration of oral intake. Pt has a  baseline of Tracheostomy (chronic since 2013) typically w/ a cuffless trach; Shiley #6. Pt eats a Regular diet at home w/ certain foods easier to eat than others she communicated to SLP. W/ oral intake/swallowing, pt exhibits an audible swallow -- louder, min bothersome to her but not grossly dysfunctional(suspect related to the exchange of pharyngeal-laryngeal pressures during the swallow d/t the tracheostomy). Pt also has a baseline of Esophageal Dysmotility issues including hiatal hernia, stricture and stenosis of the Esophagus, dilated, patulous esophagus, and intermittent tertiary contractions per chart notes(recent GI study in 10/2018). Suspect pt's Esophageal dysmotility could significantly increase her risk for Retrograde Reflux activity thus aspiration of Reflux material and impact on the Pulmonary system.  For this evaluation, pt was positioned upright and presented po trials in small amounts. Pt fed self consuming trials of thin liquids via cup, purees, and soft solids w/ No overt clinical s/s of aspiration noted. No overt coughing, no decline in O2 sats (98-100%), and no change in RR effort(21-23). Unable to assess vocal quality baseline. Pt continued w/ po trials monitoring for small amounts at a time following aspiration precautions. Baseline audible swallows noted. Encouraged pt to utilize a f/u, Dry swallow in order to fully clear any potential pharyngeal residue d/t pharyngeal-laryngeal pressure loss secondary to tracheostomy. Oral phase appeared [facility] w/ adequate bolus management, oral clearing post trials. OM exam WFL. Pt fed self w/ setup. Recommend a Mech Soft diet w/ thin liquids via Cup; general aspiration precautions including a f/u, Dry swallow for clearing. Recommend Strict REFLUX precautions and f/u w/ GI for ongoing management of Reflux. Pt swallows Pills Whole in Puree baseline(at home) - rec. continue such. ST services can be available for ongoing education while admitted.  SLP Visit  Diagnosis: Dysphagia, unspecified (R13.10)    Aspiration Risk  Mild aspiration risk;Risk for inadequate nutrition/hydration(d/t GI issues - reduced following general precs)    Diet Recommendation  Mech Soft diet w/ Thin liquids - Via Cup; general aspiration precautions; REFLUX precautions  Medication Administration: Whole meds with puree(for easier swallowing - baseline for pt)    Other  Recommendations Recommended Consults: (GI f/u; Dietician f/u) Oral Care Recommendations: Oral care BID;Patient independent with oral care Other Recommendations: (n/a)   Follow up Recommendations None      Frequency and Duration (n/a)  (n/a)       Prognosis Prognosis for Safe Diet Advancement: Good      Swallow Study   General Date of Onset: 03/20/19 HPI: Pt is a 79 yo female with tracheal stenosis requiring chronic tracheostomy admitted with generalized weakness, hypotension secondary to hypovolemia vs. sepsis, acute on chronic hypercapnic hypoxic respiratory failure likely secondary to mucous plugging requiring mechanical ventilation via chronic tracheostomy. 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 Pt had a Barium Study (GI) completed on 10/2018 which revealed "dilated, patulous esophagus; hiatal hernia; Esophageal dysmotility with intermittent tertiary contractions".  Pt was admitted for Acute respiratory failure due to possible mucus plugging with atelectasis; chronic RUL inflitrate with mild improvement. Pt required exchange of #6 uncuffed  trach to #6 cuffed  trach for mechanical ventilation during this admission. During exchange of tracheostomy pt noted to have moderate amount of thick tan secretions requiring suctioning. Pt stated she eats a Regular diet at home -- some foods are easier to eat than others. She denies any difficulty swallowing. She states she does not use a PMV but has tried in the past but w/ poor tolerance for it.  Type of Study: Bedside Swallow Evaluation Previous Swallow Assessment: none Diet Prior to this Study: NPO(regular diet at home per pt) Temperature Spikes Noted: No(wbc 9.8) Respiratory Status: Trach Collar(8-10L post Bath and min SOB) History of Recent Intubation: No(baseline tracheostomy) Behavior/Cognition: Alert;Cooperative;Pleasant mood(nonverbal d/t tracheostomy) Oral Cavity Assessment: Within Functional Limits Oral Care Completed by SLP: Yes Oral Cavity - Dentition: Adequate natural dentition Vision: Functional for self-feeding Self-Feeding Abilities: Able to feed self;Needs set up;Needs assist Patient Positioning: Upright in bed(needed min support positioning) Baseline Vocal Quality: Aphonic(d/t tracheostomy) Volitional Cough: Congested(at trach level) Volitional Swallow: Able to elicit    Oral/Motor/Sensory Function Overall Oral Motor/Sensory Function: Within functional limits   Ice Chips Ice chips: Within functional limits Presentation: Spoon(fed; 3 trials) Other Comments: audible swallows   Thin Liquid Thin Liquid: Within functional limits Presentation: Cup;Self Fed(8 trials) Other Comments: audible swallows    Nectar Thick Nectar Thick Liquid: Not tested   Honey Thick Honey Thick Liquid: Not tested   Puree Puree: Within functional limits Presentation: Self Fed;Spoon(4 trials) Other Comments: audible swallows   Solid     Solid: Within functional limits Presentation: Self Fed;Spoon(3 trials) Other Comments: audible swallows       Orinda Kenner, MS,  CCC-SLP Lyndy Russman 03/24/2019,2:25 PM

## 2019-03-24 NOTE — Progress Notes (Addendum)
Pharmacy Antibiotic Note  Angela David is a 79 y.o. female admitted on 03/20/2019 with generalized weakness, hypotension secondary to hypovolemia vs sepsis, acute hypoxic respiratory failure. 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 Pt is now weaned off mechanical ventilator. Respiratory cultures (tracheal aspirate) from 01/22 indicate growth of MRSA. One out of two BCx showed MRSA growth. However due to increased thick mucus secretions, MD recommended to keep the antibiotic therapy going. Pt was originally on cefepime 2g IV Q12H for sepsis. WBCs are WNL and afebrile. Pharmacy has been consulted for vancomycin dosing.  Plan: Continue Vancomycin IV 1000mg  Q24H (maintenance dose) Scr 0.79 AUC Goal 400-550 Calculated AUC 432  Will monitor pt clinical presentation, labs, renal function, and cultures to assess need for vancomycin. Will order trough and peak levels around the 4th maintenance dose (steady state).   Height: 5\' 3"  (160 cm) Weight: 156 lb 4.9 oz (70.9 kg) IBW/kg (Calculated) : 52.4  Temp (24hrs), Avg:98.5 F (36.9 C), Min:98.2 F (36.8 C), Max:98.7 F (37.1 C)  Recent Labs  Lab 03/20/19 1939 03/21/19 0324 03/22/19 0445 03/23/19 0422 03/24/19 0537  WBC 13.3* 12.2* 9.0 11.2* 9.8  CREATININE 0.74 0.87 0.89 0.79 0.65  LATICACIDVEN 0.9  --   --   --   --     Estimated Creatinine Clearance: 56.5 mL/min (by C-G formula based on SCr of 0.65 mg/dL).    Allergies  Allergen Reactions  . Shellfish Allergy Shortness Of Breath    respitatory distress   . Sodium Sulfite Shortness Of Breath    Severe Congestion that creates inability to breath  . Codeine Other (See Comments)    Per patient "it kept her up all night"    Antimicrobials this admission: Cefepime 2g IV  01/23 >> 01/25 Vancomycin 1g IV 01/23 >> 01/23 Vancomycin 1500mg  IV (loading dose) 01/25 >> 01/25 Vancomycin 1g IV 01/26 >>   Dose adjustments this admission: NA  Microbiology results: 01/22 BCx: No growth 3 days 01/22 BCx: No growth 3 days 01/22 Respiratory Culture (Tracheal Aspirate): Rare MRSA (finalized 01/25) 01/23 Respiratory Culture (Tracheal Aspirate): Rare consistent with normal respiratory flora (finalized 01/25) 01/22 SARS Coronavirus by PCR: Negative 01/22 Influenza A/B by PCR: Negative  Thank you for allowing pharmacy to be a part of this patient's care.  Roanna Banning, PharmD Candidate 03/24/2019 3:03 PM

## 2019-03-24 NOTE — Progress Notes (Addendum)
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  She presented to [facility] ER on 01/22 via EMS, admitted with generalized weakness, hypotension secondary to hypovolemia vs. sepsis, acute on chronic hypercapnic hypoxic respiratory failure likely secondary to mucous plugging requiring mechanical ventilation via chronic tracheostomy. work up showed worsening hypercapnia and L bibasilar atelectasis, as well as subacute R rib fractures, admitted to ICU and ventilated from 1/22/-1/23.    PT Comments    Session limited by patient fatigue. PT with difficulty keeping eyes open, and then endorsed that she was tired and wanted to defer getting out of bed today. Several bed level exercises performed with tactile cues, educated about isometric exercises, continued strengthening, mobility importance and proper nutrition to improve energy levels. Due to patient decline in functional activities this session, discharge recommendation amended to SNF pending patient progress. CSW informed.     Follow Up Recommendations  SNF     Equipment Recommendations       Recommendations for Other Services       Precautions / Restrictions      Mobility  Bed Mobility               General bed mobility comments: deferred due to patient fatigue  Transfers                    Ambulation/Gait                 Stairs              Wheelchair Mobility    Modified Rankin (Stroke Patients Only)       Balance                                            Cognition Arousal/Alertness: Awake/alert Behavior During Therapy: WFL for tasks assessed/performed Overall Cognitive Status: Within Functional Limits for tasks assessed                                 General Comments: Pt with paralyzed vocal cords and chronic trach, whispers/mouths answers      Exercises General Exercises - Lower Extremity Ankle Circles/Pumps: AROM;Strengthening;Both;10 reps Heel Slides: AROM;Strengthening;Both;10 reps Hip ABduction/ADduction: AROM;Strengthening;10 reps;Both    General Comments        Pertinent Vitals/Pain Pain Assessment: No/denies pain    Home Living                      Prior Function            PT Goals (current goals can now be found in the care plan section) Progress towards PT goals: Not progressing toward goals - comment(limited endurance/ability to participate with therapy this session)    Frequency    Min 2X/week      PT Plan Discharge plan needs to be updated    Co-evaluation  AM-PAC PT "6 Clicks" Mobility   Outcome Measure  Help needed turning from your back to your side while in a flat bed without using bedrails?: A Little Help needed moving from lying on your back to sitting on the side of a flat bed without using bedrails?: A Little Help needed moving to and from a bed to a chair (including a wheelchair)?: A Lot Help needed standing up from a chair using your arms (e.g., wheelchair or bedside chair)?: A Lot Help needed to walk in hospital room?: A Lot Help needed climbing 3-5 steps with a railing? : Total 6 Click Score: 13    End of Session Equipment Utilized During Treatment: Oxygen(trach collar) Activity Tolerance: Patient tolerated treatment well Patient left: with call bell/phone within reach;in bed;with bed alarm  set Nurse Communication: Mobility status PT Visit Diagnosis: Muscle weakness (generalized) (M62.81);Other abnormalities of gait and mobility (R26.89);Difficulty in walking, not elsewhere classified (R26.2)     Time: [id number] PT Time Calculation (min) (ACUTE ONLY): 15 min  Charges:  $Therapeutic Exercise: 8-22 mins                     Lieutenant Diego PT, DPT 2:31 PM,03/24/19

## 2019-03-24 NOTE — Progress Notes (Signed)
OT Cancellation Note  Patient Details Name: Angela David MRN: [id number] DOB: 1940/10/20   Cancelled Treatment:    Reason Eval/Treat Not Completed: Patient declined, no reason specified;Fatigue/lethargy limiting ability to participate. Upon attempt, pt finishing session with PT. Pt politely declines OT at this time 2/2 fatigue. Agreeable to OT coming back next date.   Jeni Salles, MPH, MS, OTR/L ascom [phone] 03/24/19, 1:46 PM

## 2019-03-24 NOTE — Progress Notes (Signed)
*  PRELIMINARY RESULTS* Echocardiogram 2D Echocardiogram has been performed.  Angela David 03/24/2019, 2:19 PM

## 2019-03-24 NOTE — Evaluation (Addendum)
Passy-Muir Speaking Valve - Evaluation Patient Details  Name: Angela David MRN: [id number] Date of Birth: 1940/09/10  Today's Date: 03/24/2019 Time: [id number] SLP Time Calculation (min) (ACUTE ONLY): 40 min  Past Medical History:  Past Medical History:  Diagnosis Date  . Breast cancer ([place]) 2002   right breast chemo and radiation  . Cancer [facility]) 2002   right breast  . Diabetes mellitus without complication ([place])   . Diabetic peripheral neuropathy ([place]) 08/28/2016  . GERD (gastroesophageal reflux disease)   . Glaucoma   . High cholesterol   . History of colon polyps   . Peripheral neuropathy 08/28/2016  . Personal history of chemotherapy 2002   BREAST CA  . Personal history of malignant neoplasm of breast   . Personal history of radiation therapy 2002   BREAST CA  . Vocal cord paralysis 2012   Past Surgical History:  Past Surgical History:  Procedure Laterality Date  . ABDOMINAL HYSTERECTOMY    . BREAST BIOPSY Right 2008   neg  . BREAST EXCISIONAL BIOPSY Right 2002   positive  . BREAST LUMPECTOMY Right 2002   chemo and radiation  . BREAST SURGERY Right 2002   LUMPECTOMY, radiation, chemotherapy  . CHOLECYSTECTOMY N/A 12/10/2017   Procedure: LAPAROSCOPIC CHOLECYSTECTOMY;  Surgeon: Benjamine Sprague, DO;  Location: [facility] ORS;  Service: General;  Laterality: N/A;  . COLON RESECTION Right 11/06/2018   Procedure: HAND ASSISTED LAPAROSCOPIC COLON RESECTION, RIGHT;  Surgeon: Jules Husbands, MD;  Location: [facility] ORS;  Service: General;  Laterality: Right;  . COLONOSCOPY  2009,12/30/2013  . COLONOSCOPY WITH PROPOFOL N/A 04/16/2018   Tubulovillous polyp proximal transverse colon, large sessile polyp, proximal transverse colon;  Surgeon: Robert Bellow, MD;  Location: [facility] ENDOSCOPY;  Service: Endoscopy;  Laterality: N/A;  . ESOPHAGOGASTRODUODENOSCOPY (EGD) WITH PROPOFOL N/A 10/14/2018   Procedure: ESOPHAGOGASTRODUODENOSCOPY (EGD) WITH PROPOFOL;  Surgeon: Lucilla Lame, MD;  Location:  [facility] ENDOSCOPY;  Service: Endoscopy;  Laterality: N/A;  . HYDROCELE EXCISION / REPAIR    . TRACHEAL SURGERY  2013  . TRACHEOSTOMY N/A    Dr Kathyrn Sheriff   HPI:  Pt is a 79 yo female with tracheal stenosis requiring chronic tracheostomy admitted with generalized weakness, hypotension secondary to hypovolemia vs. sepsis, acute on chronic hypercapnic hypoxic respiratory failure likely secondary to mucous plugging requiring mechanical ventilation via chronic tracheostomy. 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 Pt had a Barium Study (GI) completed on 10/2018 which revealed "dilated, patulous esophagus; hiatal hernia; Esophageal dysmotility with intermittent tertiary contractions".  Pt was admitted for Acute respiratory failure due to possible mucus plugging with atelectasis; chronic RUL inflitrate with mild improvement. Pt required exchange of #6 uncuffed trach to #6 cuffed trach for mechanical ventilation during this admission. During exchange of tracheostomy pt noted to have moderate amount of thick tan secretions requiring suctioning. Pt stated she eats a Regular diet at home -- some foods are easier to eat than others. She denies any difficulty swallowing. She states she does not use a PMV but has tried in the past but w/ poor tolerance for it.    Assessment / Plan / Recommendation Clinical Impression  Pt seen today for PMV  assessment to determine if able to utilize PMV not only for verbal communication but also for wear during oral intake to aid swallow function. Pt  stated she had attempted the PMV in the past but was not successful. Pt has a h/o Paralyzed Vocal [phone]) and tracheal stenosis; she has had a chronic trach since 2013 per chart notes. Pt's trach was changed to a Shiley #6 cuffed from a cuffless she uses at baseline. The trach cuff is deflated (at baseline) and pt is on TC O2 support; 8L. Pt positioned upright for assessment. Pt mouthed and wrote for communication w/ SLP; she followed all instructions appropriately. Finger occlusion attempted w/ No phonation or redirection of airflow superiorly despite 2-3 attempts. PMV placement attempted in order to promote hands-free verbalization. However, w/in secondds of placement, pt indicated discomfort breathing and did not feel she was achieving exhalation. PMV removed w/ a rush of air at the level of trach - no superior air movements achieved. PMV placement attempted 1x more w/ same results and discomfort. No further attempts were made. O2 sats remained 98-99%, HR 115, RR 20-22 during/post session. Pt is unable to tolerate placement/wear of the PMV to utilize the [facility] for verbal communication. Suspect pt's vocal cord paralysis and tracheal stenosis are impeding superior airflow for phonation as well as exhalation, therefore, it is NOT recommended for pt to use a PMV. Could consider consulting ENT for assessment of changing trach back to a cuffless trach, or even a smaller trach, to see if this would allow superior airflow for phonation/verbalization. This assessment and recommendation was discussed w/ pt who agreed. NSG/MD updated.   SLP Visit Diagnosis: Aphonia (R49.1)    SLP Assessment  Patient does not need any further Speech Lanaguage Pathology Services    Follow Up Recommendations  None    Frequency and Duration (n/a)  (n/a)    PMSV Trial PMSV was placed for: seconds; finger occlusion attempted as well Able to redirect subglottic air through upper airway: No Able to Attain Phonation: No Able to  Expectorate Secretions: No attempts Breath Support for Phonation: (n/a) Intelligibility: (n/a) Respirations During Trial: 22 SpO2 During Trial: 98 % Pulse During Trial: 111 Behavior: Alert;Cooperative;Expresses self well;Good eye contact;Responsive to questions(writes; mouths)   Tracheostomy Tube  Additional Tracheostomy Tube Assessment Trach Collar Period: at baseline Secretion Description: min today Frequency of Tracheal Suctioning: PRN - 2x last night per NSG Level of Secretion Expectoration: Tracheal    Vent Dependency  Vent Dependent: No    Cuff Deflation Trial  GO          Jazilyn Siegenthaler 03/24/2019, 2:00 PM Orinda Kenner, MS, CCC-SLP

## 2019-03-24 NOTE — Progress Notes (Signed)
Patient asked RT to come in and look at trach, stating it did not feel right. Rt was able to pass suction catheter with ease and patients saturations were good at 98 -100% but patient stated that  It felt as if she had a plug. Patient was lavaged with saline and suctioned, only getting back small amount of clear to white thinnish secretions. Patient still c/o tracheostomy not feeling right, Dr. Mortimer Fries was notified and he came in to assess patients trach. It was decided that an ENT consult was needed by Dr. Mortimer Fries.

## 2019-03-24 NOTE — Progress Notes (Addendum)
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 She presented to [facility] ER on 01/22 via EMS from home with worsening generalized weakness throughout the day. Per ER notes EMS reported upon their arrival at pts home her O2 sats were 87% on RA although pt denied shortness of breath, therefore 2L NRB placed over chronic tracheostomy with O2 sats increasing to 92%. She does NOT require chronic home O2 at baseline. Found to have thick mucus plugging.  Tracheostomy tube was changed and she was initially placed on mechanical ventilation.  Weaned off pretty quickly. CTA chest was negative for PE. Respiratory cultures with MRSA-cefepime discontinued and she will complete antibiotics with vancomycin.  Subjective: Feeling much better when seen this morning.  She was sitting up in the bed and eating lunch.  Denies any complaints.  She wants to go back home with home health services.  She does not use any oxygen at home with trach collar.  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  Likely secondary to mucous plugging requiring mechanical ventilation initially.  Patient has an history of vocal cord paralysis and tracheal stenosis and chronic  tracheostomy. Appears back to her baseline although still requiring some oxygen through tracheostomy collar, no history of using oxygen at home with trach collar. -Continue with bronchodilators. -Continue pulmonary hygiene. -Try initiating PT/OT-recommending SNF placement but patient would like to go back home with home health services.  Leukocytosis.  Resolved.  Unclear etiology infection versus reactive. -Patient remained afebrile. -Respiratory cultures grew MRSA. -Patient was initially treated with cefepime and vancomycin now on vancomycin according to respiratory culture results. -Continue vancomycin.  Hypophosphatemia/hypomagnesemia.  Repleting electrolytes. -Continue to monitor.  Intermittent agitation.  Patient appears calm when seen this morning. -Continue to monitor. -Delirium precautions  Type 2 diabetes.  Well-controlled with A1c of 6.6.  CBG within goal. -Continue SSI as needed.  GERD. -Continue PPI.  Objective: Vitals:   03/24/19 1000 03/24/19 1100 03/24/19 1200 03/24/19 1300  BP: (!) 146/62 (!) 144/54 (!) 144/72 (!) 138/51  Pulse: (!) 115 (!) 114 (!) 118 (!) 115  Resp: 19 19 (!) 26 17  Temp:      TempSrc:      SpO2: 97% 96% 93% 99%  Weight:      Height:        Intake/Output Summary (Last 24 hours) at 03/24/2019 1452 Last data filed at 03/24/2019 1244 Gross per 24 hour  Intake 2193.55 ml  Output 2700 ml  Net -506.45 ml   Filed Weights   03/22/19 0412 03/23/19 0115 03/24/19 0500  Weight: 72.4 kg 69.9 kg 70.9 kg    Examination:  General exam: Chronically ill-appearing lady with tracheostomy collar, in no acute distress. Respiratory system: Clear to auscultation. Respiratory effort normal. Cardiovascular system: S1 & S2 heard, RRR. No JVD, murmurs, rubs, gallops or clicks. Gastrointestinal  system: Soft, nontender, nondistended, bowel sounds positive. Central nervous system: Alert, allowing simple command appears lethargic. No focal neurological  deficits. Extremities: No edema, no cyanosis, pulses intact and symmetrical. Psychiatry: Judgement and insight appear normal.   DVT prophylaxis: Lovenox Code Status: Full Family Communication: No family at bedside Disposition Plan: Pending improvement and weaning from oxygen, will go back home with home health.  Consultants:   PCCM  Procedures:  Antimicrobials:  Vancomycin  Data Reviewed: I have personally reviewed following labs and imaging studies  CBC: Recent Labs  Lab 03/20/19 1939 03/21/19 0324 03/22/19 0445 03/23/19 0422 03/24/19 0537  WBC 13.3* 12.2* 9.0 11.2* 9.8  NEUTROABS 10.7*  --  7.0 9.6*  --   HGB 14.2 10.8* 10.6* 10.5* 11.4*  HCT 47.1* 35.5* 33.7* 33.5* 35.7*  MCV 82.2 82.9 81.2 81.9 80.2  PLT 259 202 142* 149* [id number]   Basic Metabolic Panel: Recent Labs  Lab 03/20/19 1939 03/21/19 0324 03/22/19 0445 03/23/19 0422 03/24/19 0537  NA 139 139 139 139 139  K 4.2 4.3 3.2* 4.1 3.7  CL 101 106 107 106 102  CO2 30 28 26 24 23   GLUCOSE 168* 145* 98 142* 97  BUN 16 15 11 8  6*  CREATININE 0.74 0.87 0.89 0.79 0.65  CALCIUM 10.0 8.5* 8.5* 8.7* 9.3  MG  --  1.5* 1.8 1.7 1.4*  PHOS  --  2.9  --  3.1 1.8*   GFR: Estimated Creatinine Clearance: 56.5 mL/min (by C-G formula based on SCr of 0.65 mg/dL). Liver Function Tests: Recent Labs  Lab 03/20/19 1939  AST 24  ALT 19  ALKPHOS 87  BILITOT 0.7  PROT 8.7*  ALBUMIN 3.8   No results for input(s): LIPASE, AMYLASE in the last 168 hours. No results for input(s): AMMONIA in the last 168 hours. Coagulation Profile: Recent Labs  Lab 03/20/19 1939  INR 1.1   Cardiac Enzymes: No results for input(s): CKTOTAL, CKMB, CKMBINDEX, TROPONINI in the last 168 hours. BNP (last 3 results) No results for input(s): PROBNP in the last 8760 hours. HbA1C: No results for input(s): HGBA1C in the last 72 hours. CBG: Recent Labs  Lab 03/23/19 1952 03/23/19 2348 03/24/19 0346 03/24/19 0736 03/24/19 1136  GLUCAP 79  68* 93 102* 153*   Lipid Profile: No results for input(s): CHOL, HDL, LDLCALC, TRIG, CHOLHDL, LDLDIRECT in the last 72 hours. Thyroid Function Tests: No results for input(s): TSH, T4TOTAL, FREET4, T3FREE, THYROIDAB in the last 72 hours. Anemia Panel: No results for input(s): VITAMINB12, FOLATE, FERRITIN, TIBC, IRON, RETICCTPCT in the last 72 hours. Sepsis Labs: Recent Labs  Lab 03/20/19 1939 03/21/19 0324  PROCALCITON  --  <0.10  LATICACIDVEN 0.9  --     Recent Results (from the past 240 hour(s))  Blood Culture (routine x 2)     Status: None (Preliminary result)   Collection Time: 03/20/19  7:39 PM   Specimen: BLOOD  Result Value Ref Range Status   Specimen Description BLOOD BLOOD LEFT FOREARM  Final   Special Requests   Final    BOTTLES DRAWN AEROBIC AND ANAEROBIC Blood Culture adequate volume   Culture   Final    NO GROWTH 4 DAYS Performed at [facility], [address] [place], [place] [address]    Report Status PENDING  Incomplete  Blood Culture (routine x 2)     Status: None (Preliminary result)   Collection Time: 03/20/19  7:39 PM   Specimen: BLOOD  Result Value Ref Range Status  Specimen Description BLOOD LEFT ANTECUBITAL  Final   Special Requests   Final    BOTTLES DRAWN AEROBIC AND ANAEROBIC Blood Culture adequate volume   Culture   Final    NO GROWTH 4 DAYS Performed at [facility], [address], [place], [place] [address]    Report Status PENDING  Incomplete  Respiratory Panel by RT PCR (Flu A&B, Covid) - Nasopharyngeal Swab     Status: None   Collection Time: 03/20/19  7:39 PM   Specimen: Nasopharyngeal Swab  Result Value Ref Range Status   SARS Coronavirus 2 by RT PCR NEGATIVE NEGATIVE Final    Comment: (NOTE) SARS-CoV-2 target nucleic acids are NOT DETECTED. The SARS-CoV-2 RNA is generally detectable in upper respiratoy specimens during the acute phase of infection. The lowest concentration of SARS-CoV-2 viral copies this  assay can detect is 131 copies/mL. A negative result does not preclude SARS-Cov-2 infection and should not be used as the sole basis for treatment or other patient management decisions. A negative result may occur with  improper specimen collection/handling, submission of specimen other than nasopharyngeal swab, presence of viral mutation(s) within the areas targeted by this assay, and inadequate number of viral copies (<131 copies/mL). A negative result must be combined with clinical observations, patient history, and epidemiological information. The expected result is Negative. Fact Sheet for Patients:  PinkCheek.be Fact Sheet for Healthcare Providers:  GravelBags.it This test is not yet ap proved or cleared by the [place] FDA and  has been authorized for detection and/or diagnosis of SARS-CoV-2 by FDA under an Emergency Use Authorization (EUA). This EUA will remain  in effect (meaning this test can be used) for the duration of the COVID-19 declaration under Section 564(b)(1) of the Act, 21 U.S.C. section 360bbb-3(b)(1), unless the authorization is terminated or revoked sooner.    Influenza A by PCR NEGATIVE NEGATIVE Final   Influenza B by PCR NEGATIVE NEGATIVE Final    Comment: (NOTE) The Xpert Xpress SARS-CoV-2/FLU/RSV assay is intended as an aid in  the diagnosis of influenza from Nasopharyngeal swab specimens and  should not be used as a sole basis for treatment. Nasal washings and  aspirates are unacceptable for Xpert Xpress SARS-CoV-2/FLU/RSV  testing. Fact Sheet for Patients: PinkCheek.be Fact Sheet for Healthcare Providers: GravelBags.it This test is not yet approved or cleared by the [place] FDA and  has been authorized for detection and/or diagnosis of SARS-CoV-2 by  FDA under an Emergency Use Authorization (EUA). This EUA will remain  in  effect (meaning this test can be used) for the duration of the  Covid-19 declaration under Section 564(b)(1) of the Act, 21  U.S.C. section 360bbb-3(b)(1), unless the authorization is  terminated or revoked. Performed at [facility], [address] [place], [place] [address]   Culture, respiratory     Status: None   Collection Time: 03/20/19  9:45 PM   Specimen: Tracheal Aspirate  Result Value Ref Range Status   Specimen Description   Final    TRACHEAL ASPIRATE Performed at [facility], [address], [place], [place] [address]    Special Requests   Final    NONE Performed at [facility], [address] [place], [place] [address]    Gram Stain   Final    ABUNDANT WBC PRESENT, PREDOMINANTLY PMN RARE GRAM POSITIVE COCCI Performed at [facility], [place] [address], [place], [place] [address]    Culture RARE METHICILLIN RESISTANT STAPHYLOCOCCUS AUREUS  Final  Report Status 03/23/2019 FINAL  Final   Organism ID, Bacteria METHICILLIN RESISTANT STAPHYLOCOCCUS AUREUS  Final      Susceptibility   Methicillin resistant staphylococcus aureus - MIC*    CIPROFLOXACIN >=8 RESISTANT Resistant     ERYTHROMYCIN >=8 RESISTANT Resistant     GENTAMICIN <=0.5 SENSITIVE Sensitive     OXACILLIN >=4 RESISTANT Resistant     TETRACYCLINE <=1 SENSITIVE Sensitive     VANCOMYCIN 1 SENSITIVE Sensitive     TRIMETH/SULFA <=10 SENSITIVE Sensitive     CLINDAMYCIN RESISTANT Resistant     RIFAMPIN <=0.5 SENSITIVE Sensitive     Inducible Clindamycin POSITIVE Resistant     * RARE METHICILLIN RESISTANT STAPHYLOCOCCUS AUREUS  Culture, respiratory (tracheal aspirate)     Status: None   Collection Time: 03/21/19  4:43 AM   Specimen: Tracheal Aspirate; Respiratory  Result Value Ref Range Status   Specimen Description   Final    TRACHEAL ASPIRATE Performed at [facility], [address] [place], [place] [address]    Special Requests   Final    NONE Performed  at [facility], [address] [place], [place] [address]    Gram Stain   Final    ABUNDANT WBC PRESENT, PREDOMINANTLY PMN NO ORGANISMS SEEN    Culture   Final    RARE Consistent with normal respiratory flora. Performed at [facility], [place] [address], [place], [place] [address]    Report Status 03/23/2019 FINAL  Final     Radiology Studies: DG Chest Port 1 View  Result Date: 03/24/2019 CLINICAL DATA:  Acute respiratory failure EXAM: PORTABLE CHEST 1 VIEW COMPARISON:  Radiograph 03/22/2019, CT 03/20/2019 FINDINGS: Tracheostomy tube terminates in the mid trachea some increasing opacity in the right lung base with obscuration of the right hemidiaphragm and basilar pleural thickening is compatible with pleural effusion. Some trace effusion is likely present in the left lung base as well. No visible pneumothorax. Worsening hazy interstitial opacity is present suggesting some developing pulmonary edema. The aorta is calcified. The remaining cardiomediastinal contours are unremarkable. Multiple contiguous right-sided rib fractures are again noted postsurgical changes are seen in the right axilla and right upper quadrant. IMPRESSION: 1. Moderate right pleural effusion and probable trace left pleural effusion. 2. Worsening hazy interstitial opacity suggesting developing pulmonary edema and/or worsening atelectasis 3. Multiple contiguous right rib fractures, similar to prior. Electronically Signed   By: Lovena Le M.D.   On: 03/24/2019 03:53    Scheduled Meds: . busPIRone  30 mg Oral BID  . Chlorhexidine Gluconate Cloth  6 each Topical Daily  . cycloSPORINE  1 drop Both Eyes BID  . enoxaparin (LOVENOX) injection  40 mg Subcutaneous Q24H  . insulin aspart  0-9 Units Subcutaneous Q4H  . pantoprazole  40 mg Oral QHS   Continuous Infusions: . sodium chloride    . vancomycin 200 mL/hr at 03/24/19 1244     LOS: 3 days   Time spent: 35 minutes.  I reviewed her chart.  Lorella Nimrod, MD [facility] Pager [phone]  If 7PM-7AM, please contact night-coverage [email or web address] Password [facility] 03/24/2019, 2:52 PM   This record has been created using Dragon voice recognition software. Errors have been sought and corrected,but may not always be located. Such creation errors do not reflect on the standard of care.

## 2019-03-24 NOTE — Progress Notes (Signed)
Inpatient Diabetes Program Recommendations  AACE/ADA: New Consensus Statement on Inpatient Glycemic Control (2015)  Target Ranges:  Prepandial:   less than 140 mg/dL      Peak postprandial:   less than 180 mg/dL (1-2 hours)      Critically ill patients:  140 - 180 mg/dL   Lab Results  Component Value Date   GLUCAP 102 (H) 03/24/2019   HGBA1C 6.6 (H) 03/21/2019    Review of Glycemic Control  Diabetes history: DM2 Outpatient Diabetes medications: Metformin 1 gm bid Current orders for Inpatient glycemic control: Novolog sensitive correction tid + 0-5 units hs  Inpatient Diabetes Program Recommendations:   Due to patient sensitivity to insulin: --Custom Novolog correction scale 0-5 units tid      151-200  1 unit      201-250  2 units      251-300  3 units      301-350  4 units      351-400  5 units  Thank you, Bethena Roys E. Hermilo Dutter, RN, MSN, CDE  Diabetes Coordinator Inpatient Glycemic Control Team Team Pager [phone] (8am-5pm) 03/24/2019 11:19 AM

## 2019-03-24 NOTE — Consult Note (Signed)
Angela David, [place] [id number] 1940/03/25 Lorella Nimrod, MD  Reason for Consult: Inability to change tracheostomy tube  HPI: Angela David is a 79 year old female well-known to the ENT service with a history of bilateral vocal cord paralysis.  She has had a long-term tracheostomy tube in position for same.  She was recently brought to the hospital and diagnosed with staph pneumonia her noncuffed endotracheal tube was changed for a cuffed tube for her to be ventilated.  Is no longer being ventilated and would like to have her noncuffed tube back for improved speech.  Previously had a noncuffed fenestrated #6 Shiley tracheostomy tube in position.  Allergies:  Allergies  Allergen Reactions  . Shellfish Allergy Shortness Of Breath    respitatory distress   . Sodium Sulfite Shortness Of Breath    Severe Congestion that creates inability to breath  . Codeine Other (See Comments)    Per patient "it kept her up all night"    ROS: Review of systems normal other than 12 systems except per HPI.  PMH:  Past Medical History:  Diagnosis Date  . Breast cancer ([place]) 2002   right breast chemo and radiation  . Cancer [facility]) 2002   right breast  . Diabetes mellitus without complication ([place])   . Diabetic peripheral neuropathy ([place]) 08/28/2016  . GERD (gastroesophageal reflux disease)   . Glaucoma   . High cholesterol   . History of colon polyps   . Peripheral neuropathy 08/28/2016  . Personal history of chemotherapy 2002   BREAST CA  . Personal history of malignant neoplasm of breast   . Personal history of radiation therapy 2002   BREAST CA  . Vocal cord paralysis 2012    FH:  Family History  Problem Relation Age of Onset  . Cancer Father        bone cancer  . Cancer Sister        lymphoma  . Cancer Brother   . Breast cancer Neg Hx     SH:  Social History   Socioeconomic History  . Marital status: Married    Spouse name: [name]  . Number of children: Not on file  . Years of education:  Not on file  . Highest education level: Not on file  Occupational History  . Not on file  Tobacco Use  . Smoking status: Never Smoker  . Smokeless tobacco: Never Used  Substance and Sexual Activity  . Alcohol use: No  . Drug use: No  . Sexual activity: Not on file  Other Topics Concern  . Not on file  Social History Narrative   Lives with husband   Social Determinants of Health   Financial Resource Strain:   . Difficulty of Paying Living Expenses: Not on file  Food Insecurity:   . Worried About Charity fundraiser in the Last Year: Not on file  . Ran Out of Food in the Last Year: Not on file  Transportation Needs:   . Lack of Transportation (Medical): Not on file  . Lack of Transportation (Non-Medical): Not on file  Physical Activity:   . Days of Exercise per Week: Not on file  . Minutes of Exercise per Session: Not on file  Stress:   . Feeling of Stress : Not on file  Social Connections:   . Frequency of Communication with Friends and Family: Not on file  . Frequency of Social Gatherings with Friends and Family: Not on file  . Attends Religious Services: Not on file  .  Active Member of Clubs or Organizations: Not on file  . Attends Archivist Meetings: Not on file  . Marital Status: Not on file  Intimate Partner Violence:   . Fear of Current or Ex-Partner: Not on file  . Emotionally Abused: Not on file  . Physically Abused: Not on file  . Sexually Abused: Not on file    PSH:  Past Surgical History:  Procedure Laterality Date  . ABDOMINAL HYSTERECTOMY    . BREAST BIOPSY Right 2008   neg  . BREAST EXCISIONAL BIOPSY Right 2002   positive  . BREAST LUMPECTOMY Right 2002   chemo and radiation  . BREAST SURGERY Right 2002   LUMPECTOMY, radiation, chemotherapy  . CHOLECYSTECTOMY N/A 12/10/2017   Procedure: LAPAROSCOPIC CHOLECYSTECTOMY;  Surgeon: Benjamine Sprague, DO;  Location: [facility] ORS;  Service: General;  Laterality: N/A;  . COLON RESECTION Right 11/06/2018    Procedure: HAND ASSISTED LAPAROSCOPIC COLON RESECTION, RIGHT;  Surgeon: Jules Husbands, MD;  Location: [facility] ORS;  Service: General;  Laterality: Right;  . COLONOSCOPY  2009,12/30/2013  . COLONOSCOPY WITH PROPOFOL N/A 04/16/2018   Tubulovillous polyp proximal transverse colon, large sessile polyp, proximal transverse colon;  Surgeon: Robert Bellow, MD;  Location: [facility] ENDOSCOPY;  Service: Endoscopy;  Laterality: N/A;  . ESOPHAGOGASTRODUODENOSCOPY (EGD) WITH PROPOFOL N/A 10/14/2018   Procedure: ESOPHAGOGASTRODUODENOSCOPY (EGD) WITH PROPOFOL;  Surgeon: Lucilla Lame, MD;  Location: [facility] ENDOSCOPY;  Service: Endoscopy;  Laterality: N/A;  . HYDROCELE EXCISION / REPAIR    . TRACHEAL SURGERY  2013  . TRACHEOSTOMY N/A    Dr Kathyrn Sheriff    Physical  Exam: Patient awake and alert with a #6 cuffed Shiley tracheostomy tube in position.  Currently receiving oxygen by trach collar.  External ears anterior nose oral cavity oropharynx were benign.  Procedure: Bedside fiberoptic tracheoscopy and bronchoscopy, prior to trach change a flexible laryngoscope was introduced through the tracheostomy tube.  This was easily passed through distal trachea and right and left mainstem bronchi were clear there was no evidence of obstruction.  Procedure 2: With the patient's neck gently extended the cuffed tracheostomy tube was removed, #6 noncuffed nonfenestrated Shiley tracheostomy tube was reintroduced into the stoma without difficulty.  Intercannula was placed.  Patient was able to phonate around this tube without difficulty.   A/P: Bilateral vocal paralysis with trach dependence.  The tracheostomy tube was changed as per request.  She has dealt with a tracheostomy tube for a long time as well as a Passy-Muir valve.  Would advise having speech therapy come by to bring her a Passy-Muir valve to use while in the hospital.  Should she require ventilation this trach tube can be changed back to a #6 cuffed tube as needed.  She  is well-known to the ENT service and has outpatient appointment scheduled for routine check.  Any further questions feel free to call.  ICU time approximately 45 minutes   Roena Malady 03/24/2019 7:04 PM

## 2019-03-24 NOTE — Progress Notes (Signed)
OT Cancellation Note  Patient Details Name: Angela David MRN: [id number] DOB: Jan 07, 1941   Cancelled Treatment:    Reason Eval/Treat Not Completed: Other (comment). Upon attempt, nursing with pt finishing a bath and SLP preparing to enter room for assessment. Will re-attempt at later time as pt is available and medically appropriate.   Jeni Salles, MPH, MS, OTR/L ascom [phone] 03/24/19, 8:57 AM

## 2019-03-24 NOTE — TOC Initial Note (Signed)
Transition of Care [facility]) - Initial/Assessment Note    Patient Details  Name: Angela David MRN: [id number] Date of Birth: 05/24/1940  Transition of Care [facility]) CM/SW Contact:    Shelbie Ammons, RN Phone Number: 03/24/2019, 1:41 PM  Clinical Narrative:   RNCM assessed patient at bedside who reports she anxious to get back home. Patient lives with husband reports he is able to provide her any assistance she needs. She reports that she has all necessary equipment in the home and will only be willing to have home health service again if she can have the same agency she had before. Patient reports that agency was [facility] and specifically asked  Merry Proud and Angus Palms, that were her therapists. This information was relayed to [facility] who said they could accept patient. RNCM will continue to follow for any further concerns.      Expected Discharge Plan: [place] Barriers to Discharge: Barriers Resolved   Patient Goals and CMS Choice Patient states their goals for this hospitalization and ongoing recovery are:: "I want to get back home"   Choice offered to / list presented to : Patient  Expected Discharge Plan and Services Expected Discharge Plan: [place]   Discharge Planning Services: CM Consult Post Acute Care Choice: [place] arrangements for the past 2 months: [place]: PT [place]: [place] ([place]) Date HH Agency Contacted: 03/24/19 Time HH Agency Contacted: 1130 Representative spoke with at [place]: Corene Cornea  Prior Living Arrangements/Services Living arrangements for the past 2 months: [place] with:: Spouse Patient language and need for interpreter reviewed:: Yes Do you feel safe going back to the place where you live?: Yes      Need for Family Participation in Patient Care: Yes (Comment) Care giver support system in place?: Yes (comment)    Criminal Activity/Legal Involvement Pertinent to Current Situation/Hospitalization: No - Comment as needed  Activities of Daily Living      Permission Sought/Granted Permission sought to share information with : Family Supports    Share Information with NAME: Raelyn Ensign           Emotional Assessment Appearance:: Appears stated age Attitude/Demeanor/Rapport: Engaged Affect (typically observed): Appropriate, Calm Orientation: : Oriented to Self, Oriented to Place, Oriented to  Time, Oriented to Situation Alcohol / Substance Use: Never Used Psych Involvement: No (comment)  Admission diagnosis:  Acute respiratory failure (HCC) [J96.00] Weakness [R53.1] Hypoxia [R09.02] Altered mental status, unspecified altered mental status type [R41.82] Patient Active Problem List   Diagnosis Date Noted  . Weakness   . Altered mental status   . Hypoxia   . Acute respiratory failure with hypoxia and hypercapnia ([place]) 03/21/2019  . Hypotension 03/21/2019  . Acute encephalopathy 03/21/2019  . Multiple fractures of ribs, right side, initial encounter for closed fracture 12/25/2018  . Colon polyp 11/06/2018  . Esophageal dysphagia   . 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 Stricture and stenosis of esophagus   . Elevated CEA 09/02/2018  . Goals of care, counseling/discussion 09/02/2018  . Polyp of transverse colon 09/02/2018  . Foreign body in right foot 05/26/2018  . History of tracheostomy 04/30/2018  . Moderate episode of recurrent major depressive disorder ([place]) 04/03/2018  . Poor balance 04/03/2018  . Shoulder lesion, right 02/28/2017  .  Cervical radiculopathy 09/28/2016  . Peripheral neuropathy 08/28/2016  . Diabetic peripheral neuropathy ([place]) 08/28/2016  . Leg weakness, bilateral 08/13/2016  . Gait abnormality 08/13/2016  . Carcinoma of overlapping sites of right breast in female, estrogen receptor negative ([place]) 12/27/2015  . Malignant neoplasm of breast ([place]) 03/28/2015   . 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 Hypercholesterolemia 03/28/2015  . Recurrent major depressive disorder, in partial remission ([place]) 03/01/2015  . 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 Cephalalgia 09/15/2014  . Headache due to trauma 09/15/2014  . Osteopenia 01/07/2014  . Age-related osteoporosis without current pathological fracture 01/07/2014  . History of colonic polyps 12/14/2013  . History of breast cancer 11/26/2012  . H/O malignant neoplasm of breast 11/26/2012  . 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 Tracheostomy present ([place]) 12/06/2011  . Calcification of bronchus or trachea 01/16/2011  . 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 Anxiety state 12/19/2010  . Blood in sputum 12/19/2010  . HLD (hyperlipidemia) 12/19/2010  . Type 2 diabetes mellitus ([place]) 12/19/2010   PCP:  Iva Boop, MD Pharmacy:   [facility] [address] [place] [address] Phone: [phone] Fax: [phone]  [facility] [place], [place] - [address] [place] [address] Phone: [phone] Fax: [phone]     Social Determinants of Health (SDOH) Interventions    Readmission Risk Interventions No flowsheet data found.

## 2019-03-25 LAB — RENAL FUNCTION PANEL
Albumin: 3 g/dL — ABNORMAL LOW (ref 3.5–5.0)
Anion gap: 12 (ref 5–15)
BUN: 6 mg/dL — ABNORMAL LOW (ref 8–23)
CO2: 28 mmol/L (ref 22–32)
Calcium: 9.3 mg/dL (ref 8.9–10.3)
Chloride: 98 mmol/L (ref 98–111)
Creatinine, Ser: 0.73 mg/dL (ref 0.44–1.00)
GFR calc Af Amer: >60 mL/min (ref >60)
GFR calc non Af Amer: >60 mL/min (ref >60)
Glucose, Bld: 163 mg/dL — ABNORMAL HIGH (ref 70–99)
Phosphorus: 2.4 mg/dL — ABNORMAL LOW (ref 2.5–4.6)
Potassium: 3.6 mmol/L (ref 3.5–5.1)
Sodium: 138 mmol/L (ref 135–145)

## 2019-03-25 LAB — CULTURE, BLOOD (ROUTINE X 2)
Culture: NO GROWTH
Culture: NO GROWTH
Special Requests: ADEQUATE
Special Requests: ADEQUATE

## 2019-03-25 LAB — GLUCOSE, CAPILLARY
Glucose-Capillary: 125 mg/dL — ABNORMAL HIGH (ref 70–99)
Glucose-Capillary: 169 mg/dL — ABNORMAL HIGH (ref 70–99)
Glucose-Capillary: 169 mg/dL — ABNORMAL HIGH (ref 70–99)
Glucose-Capillary: 172 mg/dL — ABNORMAL HIGH (ref 70–99)
Glucose-Capillary: 175 mg/dL — ABNORMAL HIGH (ref 70–99)
Glucose-Capillary: 183 mg/dL — ABNORMAL HIGH (ref 70–99)

## 2019-03-25 LAB — MAGNESIUM: Magnesium: 1.9 mg/dL (ref 1.7–2.4)

## 2019-03-25 MED ORDER — GABAPENTIN 300 MG PO CAPS
600.0000 mg | ORAL_CAPSULE | Freq: Three times a day (TID) | ORAL | Status: DC
  Administered 2019-03-25 – 2019-03-26 (×3): 600 mg via ORAL
  Filled 2019-03-25 (×3): qty 2

## 2019-03-25 MED ORDER — PRAVASTATIN SODIUM 20 MG PO TABS
20.0000 mg | ORAL_TABLET | Freq: Every day | ORAL | Status: DC
  Administered 2019-03-25: 21:00:00 20 mg via ORAL
  Filled 2019-03-25: qty 1

## 2019-03-25 MED ORDER — POTASSIUM PHOSPHATES 15 MMOLE/5ML IV SOLN
20.0000 mmol | Freq: Once | INTRAVENOUS | Status: AC
  Administered 2019-03-25: 20 mmol via INTRAVENOUS
  Filled 2019-03-25: qty 6.67

## 2019-03-25 MED ORDER — METOPROLOL TARTRATE 25 MG PO TABS
25.0000 mg | ORAL_TABLET | Freq: Two times a day (BID) | ORAL | Status: DC
  Administered 2019-03-25 – 2019-04-01 (×12): 25 mg via ORAL
  Filled 2019-03-25 (×13): qty 1

## 2019-03-25 MED ORDER — MUPIROCIN 2 % EX OINT
TOPICAL_OINTMENT | Freq: Two times a day (BID) | CUTANEOUS | Status: DC
  Administered 2019-03-25 – 2019-03-29 (×2): 1 via NASAL
  Filled 2019-03-25 (×2): qty 22

## 2019-03-25 MED ORDER — ESCITALOPRAM OXALATE 10 MG PO TABS
20.0000 mg | ORAL_TABLET | Freq: Every day | ORAL | Status: DC
  Administered 2019-03-25 – 2019-03-26 (×2): 20 mg via ORAL
  Filled 2019-03-25 (×3): qty 2

## 2019-03-25 NOTE — Care Management Important Message (Signed)
Important Message  Patient Details  Name: Angela David MRN: [id number] Date of Birth: 09-Jun-1940   Medicare Important Message Given:  Yes  Reviewed with spouse, requested copy be sent to home address on file.    [name] 03/25/2019, 11:40 AM

## 2019-03-25 NOTE — Evaluation (Signed)
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  She presented to [facility] on 01/22 via EMS, admitted with generalized weakness, hypotension secondary to hypovolemia vs. sepsis, acute on chronic hypercapnic hypoxic respiratory failure likely secondary to mucous plugging requiring mechanical ventilation via chronic tracheostomy. work up showed worsening hypercapnia and L bibasilar atelectasis, as well as subacute R rib fractures, admitted to ICU and ventilated from 1/22/-1/23.   Clinical Impression   Pt was seen for OT evaluation this date. Prior to hospital admission, pt reports being Indep with self care ADLs and occasionally using cane for fxl mobility. Reports being on no home O2. Pt lives with spouse in [facility] with ramped entrance. Currently pt demonstrates impairments as described below (See OT problem list) which functionally limit her ability to perform ADL/self-care tasks. Pt currently requires MOD A for fxl transfers, MOD/MAX A for LB ADLs seated, MIN A to setup for seated UB ADLs.  Pt would benefit from skilled OT to address noted impairments and functional limitations (see below for any additional details) in order to maximize safety and independence while minimizing falls risk and caregiver burden. Upon hospital discharge, recommend STR to restore fxl activity tolerance and safety with ADLs/ADL transfers.     Follow Up  Recommendations  SNF    Equipment Recommendations  3 in 1 bedside commode    Recommendations for Other Services       Precautions / Restrictions Precautions Precautions: Fall Restrictions Weight Bearing Restrictions: No      Mobility Bed Mobility Overal bed mobility: Needs Assistance Bed Mobility: Supine to Sit     Supine to sit: HOB elevated;Min assist;Mod assist        Transfers Overall transfer level: Needs assistance Equipment used: Rolling walker (2 wheeled) Transfers: Sit to/from [facility] Sit to Stand: Mod assist;From elevated surface Stand pivot transfers: Mod assist;From elevated surface       General transfer comment: walker attempted on sit to stand, pt with significant posterior lean, cannot tolerate static stand long and does not reach back to control descent back to EOB. OT provides arm in arm assist for SPS to chair.    Balance Overall balance assessment: Needs assistance Sitting-balance support: Single extremity supported Sitting balance-Leahy Scale: Fair   Postural control: Posterior lean Standing balance support: Bilateral upper extremity supported Standing balance-Leahy Scale: Poor Standing balance comment: Pt requires MOD A constantly to maintain static stand this date, wt in heels, difficulty extending hips to full stand.                           ADL either performed or assessed with clinical judgement   ADL Overall ADL's : Needs assistance/impaired                                       General ADL Comments: Pt requires MIN A for seated UB ADLs, MAX A for seated LB ADLs, MOD  A for ADL transfers, fxl mobility not assess at this time. Pt with significant posterior lean in static standing requiring constant assist.     Vision Patient Visual Report: No change from baseline       Perception     Praxis      Pertinent Vitals/Pain Pain Assessment: 0-10 Pain Score: 3  Pain Location:  headache Pain Descriptors / Indicators: Aching Pain Intervention(s): Limited activity within patient's tolerance;Monitored during session;Repositioned     Hand Dominance Right   Extremity/Trunk Assessment Upper Extremity Assessment Upper Extremity Assessment: Generalized weakness(able to move through arc of motion, grossly 3+/5 shoulder, elbow, grip MMT tested at 3+/5)   Lower Extremity Assessment Lower Extremity Assessment: Defer to PT evaluation;Generalized weakness   Cervical / Trunk Assessment Cervical / Trunk Assessment: Normal   Communication Communication Communication: Tracheostomy   Cognition Arousal/Alertness: Awake/alert Behavior During Therapy: WFL for tasks assessed/performed Overall Cognitive Status: Within Functional Limits for tasks assessed                                 General Comments: Pt with paralyzed vocal cords and chronic trach, answers appropraitely-mouths answers, A&O   General Comments       Exercises Other Exercises Other Exercises: OT facilitates education with pt re: role of OT in acute setting and d/c recommendations. Pt nods in understanding, but wants to go home. Other Exercises: OT facilitates education re: fall prevention/safety considerations with pt including notifying pt of fall alarm in chair and encouraging call light use to get OOB/out of chair. Pt verbalized understanding.   Shoulder Instructions      Home Living Family/patient expects to be discharged to:: Private residence Living Arrangements: Spouse/significant other Available Help at Discharge: Family;Available 24 hours/day Type of Home: House Home Access: Ramped entrance     Home Layout: One level     Bathroom Shower/Tub: Occupational psychologist: Standard     Home Equipment: Environmental consultant - 2 wheels;Wheelchair - manual;Cane - single point;Grab bars - toilet;Other (comment)          Prior Functioning/Environment Level of Independence: Independent  with assistive device(s)        Comments: intermittently used her cane        OT Problem List: Decreased strength;Decreased activity tolerance;Impaired balance (sitting and/or standing);Decreased knowledge of use of DME or AE      OT Treatment/Interventions: Self-care/ADL training;Therapeutic exercise;Energy conservation;DME and/or AE instruction;Therapeutic activities;Patient/family education;Balance training    OT Goals(Current goals can be found in the care plan section) Acute Rehab OT Goals Patient Stated Goal: to go home OT Goal Formulation: With patient Time For Goal Achievement: 04/08/19 Potential to Achieve Goals: Good  OT Frequency: Min 1X/week   Barriers to D/C:            Co-evaluation              AM-PAC OT "6 Clicks" Daily Activity     Outcome Measure Help from another person eating meals?: None Help from another person taking care of personal grooming?: A Little Help from another person toileting, which includes using toliet, bedpan, or urinal?: A Lot Help from another person bathing (including washing, rinsing, drying)?: A Lot Help from another person to put on and taking off regular upper body clothing?: A Little Help from another person to put on and taking off regular lower body clothing?: A Lot 6 Click Score: 16   End of  Session Equipment Utilized During Treatment: Gait belt;Rolling walker Nurse Communication: Mobility status  Activity Tolerance: Patient tolerated treatment well Patient left: in chair;with call bell/phone within reach;with chair alarm set  OT Visit Diagnosis: Unsteadiness on feet (R26.81);Muscle weakness (generalized) (M62.81)                Time: [id number] OT Time Calculation (min): 41 min Charges:  OT General Charges $OT Visit: 1 Visit OT Evaluation $OT Eval Moderate Complexity: 1 Mod OT Treatments $Self Care/Home Management : 8-22 mins $Therapeutic Activity: 8-22 mins  Gerrianne Scale, MS, OTR/L ascom  [phone] 03/25/19, 2:46 PM

## 2019-03-25 NOTE — Progress Notes (Addendum)
Speech Language Pathology Treatment: Dysphagia  Patient Details Name: Angela David MRN: [id number] DOB: 1940/10/23 Today's Date: 03/25/2019 Time: 1230-1300 SLP Time Calculation (min) (ACUTE ONLY): 30 min  Assessment / Plan / Recommendation Clinical Impression  Pt seen today as f/u w/ toleration of oral diet initiated yesterday; dysphagia level 3 (mech soft for ease), thin liquids. ENT changed pt's trach to a Shiley #6 Uncuffed yesterday pm. Pt was unable to request foods of ease/choice of eating at this Lunch meal today - pt is careful about the food consistencies she chooses to eat for ease of swallowing w/ tracheostomy. Pt denied any overt s/s of aspiration such as coughing or difficulty breathing but was unsure of eating the spaghetti she received at [facility].  SLP met w/ pt this session to discuss diet consistency and identify menu items for Dining Services to send while admitted. The focus was on cohesive foods that were moist, cooked. Also the use of condiments for moistening foods. Discussed w/ pt the importance of foods/diet that will conserve her energy as well -- not overly fatigue her during attempts to masticate/eat it. Pt agreed.  Pt consumed trials of thin liquids during session. No overt clinical s/s of aspiration noted - no coughing or SOB/WOB noted post trials. She appears to present w/ grossly adequate oropharyngeal swallow function and toleration of oral intake. Pt has a baseline of Tracheostomy (chronic since 2013) again w/ a cuffless trach; Shiley #6. Pt eats a Regular diet at home w/ certain foods easier to eat than others she communicated to SLP. W/ oral intake/swallowing, pt exhibits an audible swallow -- louder, min bothersome to her but not grossly dysfunctional(suspect related to the exchange of pharyngeal-laryngeal pressures during the swallow d/t the tracheostomy). Pt also has a baseline of Esophageal Dysmotility issues including hiatal hernia, stricture and stenosis of  the Esophagus, dilated, patulous esophagus, and intermittent tertiary contractions per chart notes(recent GI study in 10/2018). Suspect pt's Esophageal dysmotility could significantly increase her risk for Retrograde Reflux activity thus aspiration of Reflux material and impact on the Pulmonary system.  Recommend continue a more Mech Soft diet w/ thin liquids via Cup; general aspiration precautions including a f/u, Dry swallow for clearing. Recommend Strict REFLUX precautions and f/u w/ GI for ongoing management of Reflux. Pt swallows Pills Whole in Puree baseline(at home) - rec. continue such. ST services can be available for ongoing education while admitted.   Of note: at end of this session, discussed w/ pt the option of a PMV trial again. Pt mouthed to communicate that it was "too difficult". W/ the trach change yesterday to an Uncuffed trach, pt agreed to Finger Occlusion trials w/ SLP. Pt was unable to adequately redirect airflow superiorly for comfortable phonation -- she mouthed that she had to "strain" and that this caused discomfort. Pt further communicated she did not feel comfortable using a PMV. This was communicated to the MD. Suspect pt's tracheal stenosis and vocal cord paralysis may be impacting toleration of PMV use, finger occlusion, and ability to redirect airflow superiorly for phonation comfortably and effectively.       HPI HPI: Pt is a 79 yo female with tracheal stenosis requiring chronic tracheostomy admitted with generalized weakness, hypotension secondary to hypovolemia vs. sepsis, acute on chronic hypercapnic hypoxic respiratory failure likely secondary to mucous plugging requiring mechanical ventilation via chronic tracheostomy. 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 Pt had a Barium Study (GI) completed on 10/2018 which revealed "dilated, patulous esophagus; hiatal hernia; Esophageal dysmotility with intermittent tertiary contractions".  Pt was admitted for Acute respiratory failure due to possible mucus plugging with atelectasis; chronic RUL inflitrate with mild improvement. Pt required exchange of #6 uncuffed trach to #6 cuffed trach for mechanical ventilation during this admission. During exchange of tracheostomy pt noted to have moderate amount of thick tan secretions requiring suctioning. Pt stated she eats a Regular diet at home -- some foods are easier to eat than others. She denies any difficulty swallowing. She states she does not use a PMV but has tried in the past but w/ poor tolerance for it.  ENT changed trach to a Shiley #6 cuffless yesterday PM.       SLP Plan  All goals met       Recommendations  Diet recommendations: Dysphagia 3 (mechanical soft);Thin liquid Liquids provided via: Cup;Straw Medication Administration: Whole meds with puree(pt prefers at baseline) Supervision: Patient able to self feed Compensations: Minimize environmental distractions;Slow rate;Small sips/bites;Multiple dry swallows after each bite/sip;Follow solids with liquid Postural Changes and/or Swallow Maneuvers: Seated upright 90 degrees;Upright 30-60 min after meal                General recommendations: (OT/PT following) Oral Care Recommendations: Oral care BID;Patient independent with oral care Follow up Recommendations: None SLP Visit Diagnosis: Dysphagia, unspecified (R13.10) Plan: All goals met       GO                 Orinda Kenner, MS, CCC-SLP Kathleen Likins 03/25/2019, 3:44 PM

## 2019-03-25 NOTE — Progress Notes (Signed)
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 She presented to [facility] ER on 01/22 via EMS from home with worsening generalized weakness throughout the day. Per ER notes EMS reported upon their arrival at pts home her O2 sats were 87% on RA although pt denied shortness of breath, therefore 2L NRB placed over chronic tracheostomy with O2 sats increasing to 92%. She does NOT require chronic home O2 at baseline. Found to have thick mucus plugging.  Tracheostomy tube was changed and she was initially placed on mechanical ventilation.  Weaned off pretty quickly. CTA chest was negative for PE. Respiratory cultures with MRSA-cefepime discontinued and she will complete antibiotics with vancomycin.  Subjective: Feeling much better when seen this morning.  She was sitting up in the chair. No new complaints.  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  Likely secondary to mucous plugging requiring mechanical ventilation initially.  Patient has an history of vocal cord paralysis and tracheal stenosis and chronic tracheostomy. Appears back to her baseline although still requiring some oxygen through tracheostomy collar, no history of using oxygen at home with trach  collar. Tracheostomy tube was switched with noncuffed nonfenestrated Shiley tracheostomy tube #6 so patient can use Passy-Muir while for communication by ENT. -Continue with bronchodilators. -Continue pulmonary hygiene. -Try initiating PT/OT-recommending SNF placement but patient would like to go back home with home health services.  Leukocytosis.  Resolved.  Unclear etiology infection versus reactive. -Patient remained afebrile. -Respiratory cultures grew MRSA. -Patient was initially treated with cefepime and vancomycin now on vancomycin according to respiratory culture results. -Continue vancomycin for 1 more day to complete a 5-day course.  Patient can be discharged tomorrow after her vancomycin.  She is allergic to sulfa and Bactrim Is not an option at this time.  Hypophosphatemia/hypomagnesemia.  Phosphate remains low at 2.4 after repleting yesterday, need more repletion. - repleting electrolytes. -Continue to monitor.  Intermittent agitation.  Patient appears calm when seen this morning. -Continue to monitor. -Delirium precautions  Type 2 diabetes.  Well-controlled with A1c of 6.6.  CBG within goal. -Continue SSI as needed.  GERD. -Continue PPI.  Objective: Vitals:   03/25/19 0003 03/25/19 0452 03/25/19 0803 03/25/19 1144  BP: (!) 140/57 (!) 160/64 (!) 154/68 (!) 171/68  Pulse: (!) 103 89 93 100  Resp: 18 18 20 20   Temp: 98 F (36.7 C) 98.4 F (36.9 C) 98.6 F (37 C) 98.1 F (36.7 C)  TempSrc: Oral Oral Oral Oral  SpO2: 100% 99% 99% 100%  Weight:  68.5 kg    Height:        Intake/Output Summary (Last 24 hours) at 03/25/2019 1353 Last data filed at 03/24/2019 1526 Gross per 24 hour  Intake 226.86 ml  Output 600 ml  Net -373.14 ml   Filed Weights   03/23/19 0115 03/24/19 0500 03/25/19 0452  Weight:  69.9 kg 70.9 kg 68.5 kg    Examination:  General exam: Chronically ill-appearing lady with tracheostomy collar, in no acute distress. Respiratory system: Clear to  auscultation. Respiratory effort normal. Cardiovascular system: S1 & S2 heard, RRR. No JVD, murmurs, rubs, gallops or clicks. Gastrointestinal system: Soft, nontender, nondistended, bowel sounds positive. Central nervous system: Alert, allowing simple command appears lethargic. No focal neurological deficits. Extremities: No edema, no cyanosis, pulses intact and symmetrical. Psychiatry: Judgement and insight appear normal.   DVT prophylaxis: Lovenox Code Status: Full Family Communication: No family at bedside Disposition Plan: Pending improvement and weaning from oxygen, will go back home with home health, most likely tomorrow after taking last dose of vancomycin.  Consultants:   PCCM  Procedures:  Antimicrobials:  Vancomycin  Data Reviewed: I have personally reviewed following labs and imaging studies  CBC: Recent Labs  Lab 03/20/19 1939 03/21/19 0324 03/22/19 0445 03/23/19 0422 03/24/19 0537  WBC 13.3* 12.2* 9.0 11.2* 9.8  NEUTROABS 10.7*  --  7.0 9.6*  --   HGB 14.2 10.8* 10.6* 10.5* 11.4*  HCT 47.1* 35.5* 33.7* 33.5* 35.7*  MCV 82.2 82.9 81.2 81.9 80.2  PLT 259 202 142* 149* [id number]   Basic Metabolic Panel: Recent Labs  Lab 03/21/19 0324 03/22/19 0445 03/23/19 0422 03/24/19 0537 03/25/19 0443  NA 139 139 139 139 138  K 4.3 3.2* 4.1 3.7 3.6  CL 106 107 106 102 98  CO2 28 26 24 23 28   GLUCOSE 145* 98 142* 97 163*  BUN 15 11 8  6* 6*  CREATININE 0.87 0.89 0.79 0.65 0.73  CALCIUM 8.5* 8.5* 8.7* 9.3 9.3  MG 1.5* 1.8 1.7 1.4* 1.9  PHOS 2.9  --  3.1 1.8* 2.4*   GFR: Estimated Creatinine Clearance: 55.5 mL/min (by C-G formula based on SCr of 0.73 mg/dL). Liver Function Tests: Recent Labs  Lab 03/20/19 1939 03/25/19 0443  AST 24  --   ALT 19  --   ALKPHOS 87  --   BILITOT 0.7  --   PROT 8.7*  --   ALBUMIN 3.8 3.0*   No results for input(s): LIPASE, AMYLASE in the last 168 hours. No results for input(s): AMMONIA in the last 168 hours. Coagulation  Profile: Recent Labs  Lab 03/20/19 1939  INR 1.1   Cardiac Enzymes: No results for input(s): CKTOTAL, CKMB, CKMBINDEX, TROPONINI in the last 168 hours. BNP (last 3 results) No results for input(s): PROBNP in the last 8760 hours. HbA1C: No results for input(s): HGBA1C in the last 72 hours. CBG: Recent Labs  Lab 03/24/19 2058 03/24/19 2355 03/25/19 0452 03/25/19 0803 03/25/19 1135  GLUCAP 122* 102* 169* 125* 169*   Lipid Profile: No results for input(s): CHOL, HDL, LDLCALC, TRIG, CHOLHDL, LDLDIRECT in the last 72 hours. Thyroid Function Tests: No results for input(s): TSH, T4TOTAL, FREET4, T3FREE, THYROIDAB in the last 72 hours. Anemia Panel: No results for input(s): VITAMINB12, FOLATE, FERRITIN, TIBC, IRON, RETICCTPCT in the last 72 hours. Sepsis Labs: Recent Labs  Lab 03/20/19 1939 03/21/19 0324  PROCALCITON  --  <0.10  LATICACIDVEN 0.9  --     Recent Results (from the past 240 hour(s))  Blood Culture (routine x 2)     Status: None   Collection Time: 03/20/19  7:39 PM   Specimen: BLOOD  Result Value Ref Range Status   Specimen Description BLOOD BLOOD LEFT FOREARM  Final   Special Requests   Final    BOTTLES DRAWN AEROBIC AND ANAEROBIC Blood Culture adequate  volume   Culture   Final    NO GROWTH 5 DAYS Performed at [facility], [address] [place], [place] [address]    Report Status 03/25/2019 FINAL  Final  Blood Culture (routine x 2)     Status: None   Collection Time: 03/20/19  7:39 PM   Specimen: BLOOD  Result Value Ref Range Status   Specimen Description BLOOD LEFT ANTECUBITAL  Final   Special Requests   Final    BOTTLES DRAWN AEROBIC AND ANAEROBIC Blood Culture adequate volume   Culture   Final    NO GROWTH 5 DAYS Performed at [facility], [address], [place], [place] [address]    Report Status 03/25/2019 FINAL  Final  Respiratory Panel by RT PCR (Flu A&B, Covid) - Nasopharyngeal Swab     Status: None   Collection Time:  03/20/19  7:39 PM   Specimen: Nasopharyngeal Swab  Result Value Ref Range Status   SARS Coronavirus 2 by RT PCR NEGATIVE NEGATIVE Final    Comment: (NOTE) SARS-CoV-2 target nucleic acids are NOT DETECTED. The SARS-CoV-2 RNA is generally detectable in upper respiratoy specimens during the acute phase of infection. The lowest concentration of SARS-CoV-2 viral copies this assay can detect is 131 copies/mL. A negative result does not preclude SARS-Cov-2 infection and should not be used as the sole basis for treatment or other patient management decisions. A negative result may occur with  improper specimen collection/handling, submission of specimen other than nasopharyngeal swab, presence of viral mutation(s) within the areas targeted by this assay, and inadequate number of viral copies (<131 copies/mL). A negative result must be combined with clinical observations, patient history, and epidemiological information. The expected result is Negative. Fact Sheet for Patients:  PinkCheek.be Fact Sheet for Healthcare Providers:  GravelBags.it This test is not yet ap proved or cleared by the [place] FDA and  has been authorized for detection and/or diagnosis of SARS-CoV-2 by FDA under an Emergency Use Authorization (EUA). This EUA will remain  in effect (meaning this test can be used) for the duration of the COVID-19 declaration under Section 564(b)(1) of the Act, 21 U.S.C. section 360bbb-3(b)(1), unless the authorization is terminated or revoked sooner.    Influenza A by PCR NEGATIVE NEGATIVE Final   Influenza B by PCR NEGATIVE NEGATIVE Final    Comment: (NOTE) The Xpert Xpress SARS-CoV-2/FLU/RSV assay is intended as an aid in  the diagnosis of influenza from Nasopharyngeal swab specimens and  should not be used as a sole basis for treatment. Nasal washings and  aspirates are unacceptable for Xpert Xpress SARS-CoV-2/FLU/RSV   testing. Fact Sheet for Patients: PinkCheek.be Fact Sheet for Healthcare Providers: GravelBags.it This test is not yet approved or cleared by the [place] FDA and  has been authorized for detection and/or diagnosis of SARS-CoV-2 by  FDA under an Emergency Use Authorization (EUA). This EUA will remain  in effect (meaning this test can be used) for the duration of the  Covid-19 declaration under Section 564(b)(1) of the Act, 21  U.S.C. section 360bbb-3(b)(1), unless the authorization is  terminated or revoked. Performed at [facility], [address] [place], [place] [address]   Culture, respiratory     Status: None   Collection Time: 03/20/19  9:45 PM   Specimen: Tracheal Aspirate  Result Value Ref Range Status   Specimen Description   Final    TRACHEAL ASPIRATE Performed at [facility], [address], [place], [place] [address]  Special Requests   Final    NONE Performed at [facility], [place], [place] [address]    Gram Stain   Final    ABUNDANT WBC PRESENT, PREDOMINANTLY PMN RARE GRAM POSITIVE COCCI Performed at [facility], [place] [address], [place], [place] [address]    Culture RARE METHICILLIN RESISTANT STAPHYLOCOCCUS AUREUS  Final   Report Status 03/23/2019 FINAL  Final   Organism ID, Bacteria METHICILLIN RESISTANT STAPHYLOCOCCUS AUREUS  Final      Susceptibility   Methicillin resistant staphylococcus aureus - MIC*    CIPROFLOXACIN >=8 RESISTANT Resistant     ERYTHROMYCIN >=8 RESISTANT Resistant     GENTAMICIN <=0.5 SENSITIVE Sensitive     OXACILLIN >=4 RESISTANT Resistant     TETRACYCLINE <=1 SENSITIVE Sensitive     VANCOMYCIN 1 SENSITIVE Sensitive     TRIMETH/SULFA <=10 SENSITIVE Sensitive     CLINDAMYCIN RESISTANT Resistant     RIFAMPIN <=0.5 SENSITIVE Sensitive     Inducible Clindamycin POSITIVE Resistant     * RARE METHICILLIN RESISTANT  STAPHYLOCOCCUS AUREUS  Culture, respiratory (tracheal aspirate)     Status: None   Collection Time: 03/21/19  4:43 AM   Specimen: Tracheal Aspirate; Respiratory  Result Value Ref Range Status   Specimen Description   Final    TRACHEAL ASPIRATE Performed at [facility], [address] [place], [place] [address]    Special Requests   Final    NONE Performed at [facility], [address] [place], [place] [address]    Gram Stain   Final    ABUNDANT WBC PRESENT, PREDOMINANTLY PMN NO ORGANISMS SEEN    Culture   Final    RARE Consistent with normal respiratory flora. Performed at [facility], [place] [address], [place], [place] [address]    Report Status 03/23/2019 FINAL  Final     Radiology Studies: DG Chest Port 1 View  Result Date: 03/24/2019 CLINICAL DATA:  Acute respiratory failure EXAM: PORTABLE CHEST 1 VIEW COMPARISON:  Radiograph 03/22/2019, CT 03/20/2019 FINDINGS: Tracheostomy tube terminates in the mid trachea some increasing opacity in the right lung base with obscuration of the right hemidiaphragm and basilar pleural thickening is compatible with pleural effusion. Some trace effusion is likely present in the left lung base as well. No visible pneumothorax. Worsening hazy interstitial opacity is present suggesting some developing pulmonary edema. The aorta is calcified. The remaining cardiomediastinal contours are unremarkable. Multiple contiguous right-sided rib fractures are again noted postsurgical changes are seen in the right axilla and right upper quadrant. IMPRESSION: 1. Moderate right pleural effusion and probable trace left pleural effusion. 2. Worsening hazy interstitial opacity suggesting developing pulmonary edema and/or worsening atelectasis 3. Multiple contiguous right rib fractures, similar to prior. Electronically Signed   By: Lovena Le M.D.   On: 03/24/2019 03:53   ECHOCARDIOGRAM COMPLETE  Result Date: 03/24/2019   ECHOCARDIOGRAM  REPORT   Patient Name:   Gabrianna SMALL Hillegass Date of Exam: 03/24/2019 Medical Rec #:  [id number]           Height:       63.0 in Accession #:    [id number]          Weight:       156.3 lb Date of Birth:  [birth year]          BSA:          1.74 m Patient Age:    40 years  BP:           138/51 mmHg Patient Gender: F                   HR:           115 bpm. Exam Location:  [facility] Procedure: 2D Echo, Color Doppler and Cardiac Doppler Indications:     Acute respiratory insufficiency 518.82  History:         Patient has no prior history of Echocardiogram examinations.                  Risk Factors:Diabetes. Breast cancer, personal history of                  chemotherapy.  Sonographer:     Sherrie Sport RDCS (AE) Referring Phys:  [id number] Awilda Bill Diagnosing Phys: Nelva Bush MD  Sonographer Comments: Technically difficult study due to poor echo windows, no apical window and no subcostal window. Pt was on trach breathing tube- so kept supine. IMPRESSIONS  1. Left ventricular ejection fraction, by visual estimation, is 70 to 75%. The left ventricle has hyperdynamic function. There is borderline left ventricular hypertrophy.  2. Global right ventricle was not well visualized.The right ventricular size is not well visualized. No increase in right ventricular wall thickness.  3. Left atrial size was not well visualized.  4. Right atrial size was not well visualized.  5. The pericardium was not well visualized.  6. Moderate to severe mitral annular calcification.  7. The mitral valve was not well visualized. Unable to assess mitral valve regurgitation.  8. The tricuspid valve is not well visualized.  9. The tricuspid valve is not well visualized. Tricuspid valve regurgitation was not well assessed. 10. Aortic valve regurgitation was not well assessed. 11. The aortic valve was not well visualized. Aortic valve regurgitation was not well assessed. 12. The pulmonic valve was not well visualized. Pulmonic valve  regurgitation is not visualized. 13. TR signal is inadequate for assessing pulmonary artery systolic pressure. 14. The interatrial septum was not well visualized. FINDINGS  Left Ventricle: Left ventricular ejection fraction, by visual estimation, is 70 to 75%. The left ventricle has hyperdynamic function. The left ventricle is not well visualized. The left ventricular internal cavity size was the left ventricle is normal in size. There is borderline left ventricular hypertrophy. Right Ventricle: The right ventricular size is not well visualized. No increase in right ventricular wall thickness. Global RV systolic function is was not well visualized. Left Atrium: Left atrial size was not well visualized. Right Atrium: Right atrial size was not well visualized Pericardium: The pericardium was not well visualized. Mitral Valve: The mitral valve was not well visualized. Moderate to severe mitral annular calcification. Unable to assess mitral valve regurgitation. Tricuspid Valve: The tricuspid valve is not well visualized. Tricuspid valve regurgitation was not well assessed. Aortic Valve: The aortic valve was not well visualized. Aortic valve regurgitation was not well assessed. Pulmonic Valve: The pulmonic valve was not well visualized. Pulmonic valve regurgitation is not visualized. Pulmonic regurgitation is not visualized. No evidence of pulmonic stenosis. Aorta: The aortic root is normal in size and structure. Pulmonary Artery: The pulmonary artery is not well seen. Venous: The inferior vena cava was not well visualized. IAS/Shunts: The interatrial septum was not well visualized.  LEFT VENTRICLE PLAX 2D LVIDd:         3.67 cm LVIDs:         2.55 cm LV PW:  0.99 cm LV IVS:        0.99 cm LVOT diam:     2.00 cm LV SV:         34 ml LV SV Index:   18.70 LVOT Area:     3.14 cm  LEFT ATRIUM         Index LA diam:    3.60 cm 2.07 cm/m                        PULMONIC VALVE AORTA                 PV Vmax:        0.44  m/s Ao Root diam: 1.90 cm PV Peak grad:   0.8 mmHg                       RVOT Peak grad: 3 mmHg   SHUNTS Systemic Diam: 2.00 cm  Nelva Bush MD Electronically signed by Nelva Bush MD Signature Date/Time: 03/24/2019/3:43:33 PM    Final     Scheduled Meds: . busPIRone  30 mg Oral BID  . Chlorhexidine Gluconate Cloth  6 each Topical Daily  . cycloSPORINE  1 drop Both Eyes BID  . enoxaparin (LOVENOX) injection  40 mg Subcutaneous Q24H  . escitalopram  20 mg Oral Daily  . folic acid  1 mg Oral Daily  . gabapentin  600 mg Oral TID  . insulin aspart  0-9 Units Subcutaneous Q4H  . Melatonin  5 mg Oral QHS  . metoprolol tartrate  25 mg Oral BID  . mupirocin ointment   Nasal BID  . pantoprazole  40 mg Oral QHS  . pravastatin  20 mg Oral QHS  . rOPINIRole  0.5 mg Oral QHS   Continuous Infusions: . sodium chloride    . potassium PHOSPHATE IVPB (in mmol) 20 mmol (03/25/19 1201)  . vancomycin 1,000 mg (03/25/19 1013)     LOS: 4 days   Time spent: 35 minutes.   Lorella Nimrod, MD [facility] Pager [phone]  If 7PM-7AM, please contact night-coverage [email or web address] Password [facility] 03/25/2019, 1:53 PM   This record has been created using Systems analyst. Errors have been sought and corrected,but may not always be located. Such creation errors do not reflect on the standard of care.

## 2019-03-25 NOTE — Progress Notes (Signed)
PIV consult: Pt up in chair. Requested RN re-enter consult when in bed. Pt has patent IV infusing Vancomycin with 13min left to infuse.

## 2019-03-25 NOTE — Progress Notes (Signed)
PT Cancellation Note  Patient Details Name: Randeep Colglazier MRN: [id number] DOB: 1941-01-20   Cancelled Treatment:    Reason Eval/Treat Not Completed: Other (comment)(Pt up in chair, politely declined PT for now. Agreeable for PT to re-attempt as able. Family at bedside.)   Lieutenant Diego PT, DPT 1:28 PM,03/25/19

## 2019-03-26 ENCOUNTER — Inpatient Hospital Stay: Payer: Medicare Other

## 2019-03-26 LAB — BLOOD GAS, ARTERIAL
Acid-Base Excess: 9 mmol/L — ABNORMAL HIGH (ref 0.0–2.0)
Bicarbonate: 39.5 mmol/L — ABNORMAL HIGH (ref 20.0–28.0)
FIO2: 0.28
O2 Saturation: 93.5 %
Patient temperature: 37
pCO2 arterial: 86 mmHg (ref 32.0–48.0)
pH, Arterial: 7.27 — ABNORMAL LOW (ref 7.350–7.450)
pO2, Arterial: 78 mmHg — ABNORMAL LOW (ref 83.0–108.0)

## 2019-03-26 LAB — GLUCOSE, CAPILLARY
Glucose-Capillary: 152 mg/dL — ABNORMAL HIGH (ref 70–99)
Glucose-Capillary: 148 mg/dL — ABNORMAL HIGH (ref 70–99)
Glucose-Capillary: 194 mg/dL — ABNORMAL HIGH (ref 70–99)
Glucose-Capillary: 173 mg/dL — ABNORMAL HIGH (ref 70–99)
Glucose-Capillary: 132 mg/dL — ABNORMAL HIGH (ref 70–99)
Glucose-Capillary: 77 mg/dL (ref 70–99)
Glucose-Capillary: 95 mg/dL (ref 70–99)

## 2019-03-26 LAB — RENAL FUNCTION PANEL
Albumin: 2.8 g/dL — ABNORMAL LOW (ref 3.5–5.0)
Anion gap: 12 (ref 5–15)
BUN: 11 mg/dL (ref 8–23)
CO2: 32 mmol/L (ref 22–32)
Calcium: 9.5 mg/dL (ref 8.9–10.3)
Chloride: 92 mmol/L — ABNORMAL LOW (ref 98–111)
Creatinine, Ser: 0.82 mg/dL (ref 0.44–1.00)
GFR calc Af Amer: >60 mL/min (ref >60)
GFR calc non Af Amer: >60 mL/min (ref >60)
Glucose, Bld: 89 mg/dL (ref 70–99)
Phosphorus: 2 mg/dL — ABNORMAL LOW (ref 2.5–4.6)
Potassium: 4.2 mmol/L (ref 3.5–5.1)
Sodium: 136 mmol/L (ref 135–145)

## 2019-03-26 LAB — MAGNESIUM: Magnesium: 1.5 mg/dL — ABNORMAL LOW (ref 1.7–2.4)

## 2019-03-26 MED ORDER — ORAL CARE MOUTH RINSE
15.0000 mL | OROMUCOSAL | Status: DC
  Administered 2019-03-26 – 2019-04-01 (×32): 15 mL via OROMUCOSAL

## 2019-03-26 MED ORDER — LACTATED RINGERS IV BOLUS
500.0000 mL | Freq: Once | INTRAVENOUS | Status: AC
  Administered 2019-03-26: 500 mL via INTRAVENOUS

## 2019-03-26 MED ORDER — MAGNESIUM SULFATE 2 GM/50ML IV SOLN
2.0000 g | Freq: Once | INTRAVENOUS | Status: AC
  Administered 2019-03-27: 2 g via INTRAVENOUS
  Filled 2019-03-26: qty 50

## 2019-03-26 MED ORDER — LACTATED RINGERS IV SOLN: INTRAVENOUS | Status: DC

## 2019-03-26 MED ORDER — VANCOMYCIN HCL 1250 MG/250ML IV SOLN
1250.0000 mg | INTRAVENOUS | Status: DC
  Filled 2019-03-26: qty 250

## 2019-03-26 MED ORDER — CHLORHEXIDINE GLUCONATE 0.12% ORAL RINSE (MEDLINE KIT)
15.0000 mL | Freq: Two times a day (BID) | OROMUCOSAL | Status: DC
  Administered 2019-03-26 – 2019-03-31 (×9): 15 mL via OROMUCOSAL

## 2019-03-26 MED ORDER — VANCOMYCIN HCL IN DEXTROSE 1-5 GM/200ML-% IV SOLN
1000.0000 mg | INTRAVENOUS | Status: DC
  Administered 2019-03-26: 10:00:00 1000 mg via INTRAVENOUS
  Filled 2019-03-26 (×2): qty 200

## 2019-03-26 NOTE — Progress Notes (Addendum)
Pharmacy Antibiotic Note  Angela David is a 79 y.o. female admitted on 03/20/2019 with generalized weakness, hypotension secondary to hypovolemia vs sepsis, acute hypoxic respiratory failure. 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 Pt is now weaned off mechanical ventilator. Respiratory cultures (tracheal aspirate) from 01/22 indicate growth of MRSA. One out of two BCx showed MRSA growth. However due to increased thick mucus secretions, MD recommended to keep the antibiotic therapy going. Pt was originally on cefepime 2g IV Q12H for sepsis. WBCs are WNL and afebrile. Pharmacy has been consulted for vancomycin dosing.  Plan: Will continue Vancomycin IV 1000mg  Q24H (maintenance dose) Scr 0.80 AUC Goal 400-550 Calculated AUC 445  Will monitor pt clinical presentation, labs, renal function, and cultures to assess need for vancomycin.   Height: 5\' 3"  (160 cm) Weight: 151 lb 0.2 oz (68.5 kg) IBW/kg (Calculated) : 52.4  Temp (24hrs), Avg:97.8 F (36.6 C), Min:96.7 F (35.9 C), Max:98.3 F (36.8 C)  Recent Labs  Lab 03/20/19 1939 03/20/19 1939 03/21/19 0324 03/22/19 0445 03/23/19 0422 03/24/19 0537 03/25/19 0443  WBC 13.3*  --  12.2* 9.0 11.2* 9.8  --   CREATININE 0.74   < > 0.87 0.89 0.79 0.65 0.73  LATICACIDVEN 0.9  --   --   --   --   --   --    < > = values in this interval not displayed.    Estimated Creatinine Clearance: 55.5 mL/min (by C-G formula based on SCr of 0.73 mg/dL).    Allergies  Allergen Reactions  . Shellfish Allergy Shortness Of Breath    respitatory distress   . Sodium Sulfite Shortness Of Breath    Severe Congestion that creates inability to breath  . Codeine Other (See Comments)    Per patient "it kept her up all night"    Antimicrobials this  admission: Cefepime 2g IV 01/23 >> 01/25 Vancomycin 1g IV 01/23 >> 01/23 Vancomycin 1500mg  IV (loading dose) 01/25 >> 01/25 Vancomycin  01/26 >>    Microbiology results: 01/22 BCx: No growth 3 days 01/22 BCx: No growth 3 days 01/22 Respiratory Culture (Tracheal Aspirate): Rare MRSA (finalized 01/25) 01/23 Respiratory Culture (Tracheal Aspirate): Rare consistent with normal respiratory flora (finalized 01/25) 01/22 SARS Coronavirus by PCR: Negative 01/22 Influenza A/B by PCR: Negative  Thank you for allowing pharmacy to be a part of this patient's care.  Pernell Dupre, PharmD, BCPS Clinical Pharmacist 03/26/2019 8:33 AM

## 2019-03-26 NOTE — Plan of Care (Signed)

## 2019-03-26 NOTE — Progress Notes (Signed)
Order received from Dr Amin to discontinue telemetry °

## 2019-03-26 NOTE — Progress Notes (Addendum)
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 She presented to [facility] ER on 01/22 via EMS from home with worsening generalized weakness throughout the day. Per ER notes EMS reported upon their arrival at pts home her O2 sats were 87% on RA although pt denied shortness of breath, therefore 2L NRB placed over chronic tracheostomy with O2 sats increasing to 92%. She does NOT require chronic home O2 at baseline. Found to have thick mucus plugging.  Tracheostomy tube was changed and she was initially placed on mechanical ventilation.  Weaned off pretty quickly. CTA chest was negative for PE. Respiratory cultures with MRSA-cefepime discontinued and she will complete antibiotics with vancomycin.  Subjective: Patient appears lethargic and sleepy when seen this morning.  Able to answer questions, stating that she feels very tired and wants to sleep. Later received a nursing concern that patient remained very sleepy and did not eating her meals.  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  Likely secondary to mucous plugging requiring mechanical ventilation initially.  Patient has an history of vocal cord paralysis and tracheal stenosis and chronic tracheostomy. Appears  back to her baseline although still requiring some oxygen through tracheostomy collar, no history of using oxygen at home with trach collar. Tracheostomy tube was switched with noncuffed nonfenestrated Shiley tracheostomy tube #6 so patient can use Passy-Muir while for communication by ENT. -Continue with bronchodilators. -Continue pulmonary hygiene. -Try initiating PT/OT-recommending SNF placement but patient would like to go back home with home health services. -Try weaning her on from oxygen, currently on 3 L-does not use oxygen at home.  Increased somnolence.  Patient was pleasant and oriented.  Appears tired and sleepy, stating that she wants to sleep.  Nursing concerned that she is not eating her meals and remained very somnolent. -Get an ABG.-Consistent with respiratory acidosis and CO2 retention. -Reconsulted PCCM as patient is on trach.  Not much option with BiPAP at this time.  Might need transfer to ICU.  Waiting for their evaluation.  Leukocytosis.  Resolved.  Unclear etiology infection versus reactive. -Patient remained afebrile. -Respiratory cultures grew MRSA. -Patient was initially treated with cefepime and vancomycin now on vancomycin according to respiratory culture results. -Continue vancomycin for 1 more day to complete a 5-day course, completed the course of vancomycin today.  Hypophosphatemia/hypomagnesemia.  Resolved. - repleting electrolytes. -Continue to monitor.  Intermittent agitation.  Patient appears calm but tired when seen this morning. -Continue to monitor. -Delirium precautions  Type 2 diabetes.  Well-controlled with A1c of 6.6.  CBG within goal. -Continue SSI as needed.  GERD. -Continue PPI.  Objective: Vitals:   03/26/19 0108 03/26/19 0316 03/26/19 0358 03/26/19 0752  BP: (!) 160/69 (!) 154/63 (!) 159/61 (!) 158/71  Pulse: 82 78 79 67  Resp:   17 18  Temp:   (!) 96.7 F (35.9 C) 98  F (36.7 C)  TempSrc:   Axillary   SpO2:   98% 97%  Weight:       Height:        Intake/Output Summary (Last 24 hours) at 03/26/2019 1306 Last data filed at 03/26/2019 0600 Gross per 24 hour  Intake 517.47 ml  Output 300 ml  Net 217.47 ml   Filed Weights   03/23/19 0115 03/24/19 0500 03/25/19 0452  Weight: 69.9 kg 70.9 kg 68.5 kg    Examination:  General exam: Chronically ill-appearing lady with tracheostomy collar, in no acute distress. Respiratory system: Clear to auscultation. Respiratory effort normal. Cardiovascular system: S1 & S2 heard, RRR. No JVD, murmurs, rubs, gallops or clicks. Gastrointestinal system: Soft, nontender, nondistended, bowel sounds positive. Central nervous system: Somnolent, allowing simple command appears lethargic. No focal neurological deficits. Extremities: No edema, no cyanosis, pulses intact and symmetrical. Psychiatry: Judgement and insight appear normal.   DVT prophylaxis: Lovenox Code Status: Full Family Communication: No family at bedside Disposition Plan: Pending improvement and weaning from oxygen, will go back home with home health.  Plan was to discharge today after last dose of vancomycin but patient appears very somnolent.  Still on 3 L.  Consultants:   PCCM  Procedures:  Antimicrobials:  Vancomycin  Data Reviewed: I have personally reviewed following labs and imaging studies  CBC: Recent Labs  Lab 03/20/19 1939 03/21/19 0324 03/22/19 0445 03/23/19 0422 03/24/19 0537  WBC 13.3* 12.2* 9.0 11.2* 9.8  NEUTROABS 10.7*  --  7.0 9.6*  --   HGB 14.2 10.8* 10.6* 10.5* 11.4*  HCT 47.1* 35.5* 33.7* 33.5* 35.7*  MCV 82.2 82.9 81.2 81.9 80.2  PLT 259 202 142* 149* [id number]   Basic Metabolic Panel: Recent Labs  Lab 03/21/19 0324 03/22/19 0445 03/23/19 0422 03/24/19 0537 03/25/19 0443  NA 139 139 139 139 138  K 4.3 3.2* 4.1 3.7 3.6  CL 106 107 106 102 98  CO2 28 26 24 23 28   GLUCOSE 145* 98 142* 97 163*  BUN 15 11 8  6* 6*  CREATININE 0.87 0.89 0.79 0.65 0.73  CALCIUM 8.5* 8.5* 8.7*  9.3 9.3  MG 1.5* 1.8 1.7 1.4* 1.9  PHOS 2.9  --  3.1 1.8* 2.4*   GFR: Estimated Creatinine Clearance: 55.5 mL/min (by C-G formula based on SCr of 0.73 mg/dL). Liver Function Tests: Recent Labs  Lab 03/20/19 1939 03/25/19 0443  AST 24  --   ALT 19  --   ALKPHOS 87  --   BILITOT 0.7  --   PROT 8.7*  --   ALBUMIN 3.8 3.0*   No results for input(s): LIPASE, AMYLASE in the last 168 hours. No results for input(s): AMMONIA in the last 168 hours. Coagulation Profile: Recent Labs  Lab 03/20/19 1939  INR 1.1   Cardiac Enzymes: No results for input(s): CKTOTAL, CKMB, CKMBINDEX, TROPONINI in the last 168 hours. BNP (last 3 results) No results for input(s): PROBNP in the last 8760 hours. HbA1C: No results for input(s): HGBA1C in the last 72 hours. CBG: Recent Labs  Lab 03/25/19 1956 03/25/19 2340 03/26/19 0356 03/26/19 0756 03/26/19 1214  GLUCAP 175* 183* 152* 148* 194*   Lipid Profile: No results for input(s): CHOL, HDL, LDLCALC, TRIG, CHOLHDL, LDLDIRECT in the last 72 hours. Thyroid Function Tests: No results for input(s): TSH, T4TOTAL, FREET4, T3FREE, THYROIDAB in the last 72 hours. Anemia Panel: No results for input(s): VITAMINB12, FOLATE, FERRITIN, TIBC, IRON, RETICCTPCT in the last 72 hours. Sepsis Labs: Recent Labs  Lab 03/20/19 1939 03/21/19 0324  PROCALCITON  --  <0.10  LATICACIDVEN 0.9  --     Recent Results (from the past 240 hour(s))  Blood Culture (routine x 2)     Status: None   Collection Time: 03/20/19  7:39 PM   Specimen: BLOOD  Result Value Ref Range Status   Specimen Description BLOOD BLOOD LEFT FOREARM  Final   Special Requests   Final    BOTTLES DRAWN AEROBIC AND ANAEROBIC Blood Culture adequate volume   Culture   Final    NO GROWTH 5 DAYS Performed at [facility], [address], [place], [place] [address]    Report Status 03/25/2019 FINAL  Final  Blood Culture (routine x 2)     Status: None   Collection Time: 03/20/19  7:39  PM   Specimen: BLOOD  Result Value Ref Range Status   Specimen Description BLOOD LEFT ANTECUBITAL  Final   Special Requests   Final    BOTTLES DRAWN AEROBIC AND ANAEROBIC Blood Culture adequate volume   Culture   Final    NO GROWTH 5 DAYS Performed at [facility], [address], [place], [place] [address]    Report Status 03/25/2019 FINAL  Final  Respiratory Panel by RT PCR (Flu A&B, Covid) - Nasopharyngeal Swab     Status: None   Collection Time: 03/20/19  7:39 PM   Specimen: Nasopharyngeal Swab  Result Value Ref Range Status   SARS Coronavirus 2 by RT PCR NEGATIVE NEGATIVE Final    Comment: (NOTE) SARS-CoV-2 target nucleic acids are NOT DETECTED. The SARS-CoV-2 RNA is generally detectable in upper respiratoy specimens during the acute phase of infection. The lowest concentration of SARS-CoV-2 viral copies this assay can detect is 131 copies/mL. A negative result does not preclude SARS-Cov-2 infection and should not be used as the sole basis for treatment or other patient management decisions. A negative result may occur with  improper specimen collection/handling, submission of specimen other than nasopharyngeal swab, presence of viral mutation(s) within the areas targeted by this assay, and inadequate number of viral copies (<131 copies/mL). A negative result must be combined with clinical observations, patient history, and epidemiological information. The expected result is Negative. Fact Sheet for Patients:  PinkCheek.be Fact Sheet for Healthcare Providers:  GravelBags.it This test is not yet ap proved or cleared by the [place] FDA and  has been authorized for detection and/or diagnosis of SARS-CoV-2 by FDA under an Emergency Use Authorization (EUA). This EUA will remain  in effect (meaning this test can be used) for the duration of the COVID-19 declaration under Section 564(b)(1) of the Act, 21  U.S.C. section 360bbb-3(b)(1), unless the authorization is terminated or revoked sooner.    Influenza A by PCR NEGATIVE NEGATIVE Final   Influenza B by PCR NEGATIVE NEGATIVE Final    Comment: (NOTE) The Xpert Xpress SARS-CoV-2/FLU/RSV assay is intended as an aid in  the diagnosis of influenza from Nasopharyngeal swab specimens and  should not be used as a sole basis for treatment. Nasal washings and  aspirates are unacceptable for Xpert Xpress SARS-CoV-2/FLU/RSV  testing. Fact Sheet for Patients: PinkCheek.be Fact Sheet for Healthcare Providers: GravelBags.it This test is not yet approved or cleared by the [place] FDA and  has been authorized for detection and/or diagnosis of SARS-CoV-2 by  FDA under an Emergency Use Authorization (EUA). This EUA will remain  in effect (meaning this test can be used) for the duration of the  Covid-19  declaration under Section 564(b)(1) of the Act, 21  U.S.C. section 360bbb-3(b)(1), unless the authorization is  terminated or revoked. Performed at [facility], [address] [place], [place] [address]   Culture, respiratory     Status: None   Collection Time: 03/20/19  9:45 PM   Specimen: Tracheal Aspirate  Result Value Ref Range Status   Specimen Description   Final    TRACHEAL ASPIRATE Performed at [facility], [address], [place], [place] [address]    Special Requests   Final    NONE Performed at [facility], [address] [place], [place] [address]    Gram Stain   Final    ABUNDANT WBC PRESENT, PREDOMINANTLY PMN RARE GRAM POSITIVE COCCI Performed at [facility], [place] [address], [place], [place] [address]    Culture RARE METHICILLIN RESISTANT STAPHYLOCOCCUS AUREUS  Final   Report Status 03/23/2019 FINAL  Final   Organism ID, Bacteria METHICILLIN RESISTANT STAPHYLOCOCCUS AUREUS  Final      Susceptibility   Methicillin resistant  staphylococcus aureus - MIC*    CIPROFLOXACIN >=8 RESISTANT Resistant     ERYTHROMYCIN >=8 RESISTANT Resistant     GENTAMICIN <=0.5 SENSITIVE Sensitive     OXACILLIN >=4 RESISTANT Resistant     TETRACYCLINE <=1 SENSITIVE Sensitive     VANCOMYCIN 1 SENSITIVE Sensitive     TRIMETH/SULFA <=10 SENSITIVE Sensitive     CLINDAMYCIN RESISTANT Resistant     RIFAMPIN <=0.5 SENSITIVE Sensitive     Inducible Clindamycin POSITIVE Resistant     * RARE METHICILLIN RESISTANT STAPHYLOCOCCUS AUREUS  Culture, respiratory (tracheal aspirate)     Status: None   Collection Time: 03/21/19  4:43 AM   Specimen: Tracheal Aspirate; Respiratory  Result Value Ref Range Status   Specimen Description   Final    TRACHEAL ASPIRATE Performed at [facility], [address] [place], [place] [address]    Special Requests   Final    NONE Performed at [facility], [address] [place], [place] [address]    Gram Stain   Final    ABUNDANT WBC PRESENT, PREDOMINANTLY PMN NO ORGANISMS SEEN    Culture   Final    RARE Consistent with normal respiratory flora. Performed at [facility], [place] [address], [place], [place] [address]    Report Status 03/23/2019 FINAL  Final     Radiology Studies: ECHOCARDIOGRAM COMPLETE  Result Date: 03/24/2019   ECHOCARDIOGRAM REPORT   Patient Name:   Kennadie SMALL Date Date of Exam: 03/24/2019 Medical Rec #:  [id number]           Height:       63.0 in Accession #:    [id number]          Weight:       156.3 lb Date of Birth:  March 25, 1940          BSA:          1.74 m Patient Age:    33 years            BP:           138/51 mmHg Patient Gender: F                   HR:           115 bpm. Exam Location:  [facility] Procedure: 2D Echo, Color Doppler and Cardiac Doppler Indications:     Acute respiratory insufficiency 518.82  History:         Patient has no prior history of Echocardiogram examinations.                  Risk Factors:Diabetes. Breast cancer, personal history of                   chemotherapy.  Sonographer:     Sherrie Sport RDCS (AE) Referring Phys:  [id number] Awilda Bill Diagnosing Phys: Nelva Bush MD  Sonographer Comments: Technically difficult study due to poor echo windows, no apical window and no subcostal window. Pt was on trach breathing tube- so kept supine. IMPRESSIONS  1. Left ventricular ejection fraction, by visual estimation, is 70 to 75%. The left ventricle has hyperdynamic function. There is borderline left ventricular hypertrophy.  2. Global right ventricle was not well visualized.The right ventricular size is not well visualized. No increase in right ventricular wall thickness.  3. Left atrial size was not well visualized.  4. Right atrial size was not well visualized.  5. The pericardium was not well visualized.  6. Moderate to severe mitral annular calcification.  7. The mitral valve was not well visualized. Unable to assess mitral valve regurgitation.  8. The tricuspid valve is not well visualized.  9. The tricuspid valve is not well visualized. Tricuspid valve regurgitation was not well assessed. 10. Aortic valve regurgitation was not well assessed. 11. The aortic valve was not well visualized. Aortic valve regurgitation was not well assessed. 12. The pulmonic valve was not well visualized. Pulmonic valve regurgitation is not visualized. 13. TR signal is inadequate for assessing pulmonary artery systolic pressure. 14. The interatrial septum was not well visualized. FINDINGS  Left Ventricle: Left ventricular ejection fraction, by visual estimation, is 70 to 75%. The left ventricle has hyperdynamic function. The left ventricle is not well visualized. The left ventricular internal cavity size was the left ventricle is normal in size. There is borderline left ventricular hypertrophy. Right Ventricle: The right ventricular size is not well visualized. No increase in right ventricular wall thickness. Global RV systolic function is was not well visualized.  Left Atrium: Left atrial size was not well visualized. Right Atrium: Right atrial size was not well visualized Pericardium: The pericardium was not well visualized. Mitral Valve: The mitral valve was not well visualized. Moderate to severe mitral annular calcification. Unable to assess mitral valve regurgitation. Tricuspid Valve: The tricuspid valve is not well visualized. Tricuspid valve regurgitation was not well assessed. Aortic Valve: The aortic valve was not well visualized. Aortic valve regurgitation was not well assessed. Pulmonic Valve: The pulmonic valve was not well visualized. Pulmonic valve regurgitation is not visualized. Pulmonic regurgitation is not visualized. No evidence of pulmonic stenosis. Aorta: The aortic root is normal in size and structure. Pulmonary Artery: The pulmonary artery is not well seen. Venous: The inferior vena cava was not well visualized. IAS/Shunts: The interatrial septum was not well visualized.  LEFT VENTRICLE PLAX 2D LVIDd:         3.67 cm LVIDs:         2.55 cm LV PW:         0.99 cm LV IVS:        0.99 cm LVOT diam:     2.00 cm LV SV:         34 ml LV SV Index:   18.70 LVOT Area:     3.14 cm  LEFT ATRIUM         Index LA diam:  3.60 cm 2.07 cm/m                        PULMONIC VALVE AORTA                 PV Vmax:        0.44 m/s Ao Root diam: 1.90 cm PV Peak grad:   0.8 mmHg                       RVOT Peak grad: 3 mmHg   SHUNTS Systemic Diam: 2.00 cm  Nelva Bush MD Electronically signed by Nelva Bush MD Signature Date/Time: 03/24/2019/3:43:33 PM    Final     Scheduled Meds: . busPIRone  30 mg Oral BID  . Chlorhexidine Gluconate Cloth  6 each Topical Daily  . cycloSPORINE  1 drop Both Eyes BID  . enoxaparin (LOVENOX) injection  40 mg Subcutaneous Q24H  . escitalopram  20 mg Oral Daily  . folic acid  1 mg Oral Daily  . gabapentin  600 mg Oral TID  . insulin aspart  0-9 Units Subcutaneous Q4H  . Melatonin  5 mg Oral QHS  . metoprolol tartrate  25 mg  Oral BID  . mupirocin ointment   Nasal BID  . pantoprazole  40 mg Oral QHS  . pravastatin  20 mg Oral QHS  . rOPINIRole  0.5 mg Oral QHS   Continuous Infusions: . sodium chloride    . vancomycin 1,000 mg (03/26/19 1021)     LOS: 5 days   Time spent: 35 minutes.   Lorella Nimrod, MD [facility] Pager [phone]  If 7PM-7AM, please contact night-coverage [email or web address] Password [facility] 03/26/2019, 1:06 PM   This record has been created using Systems analyst. Errors have been sought and corrected,but may not always be located. Such creation errors do not reflect on the standard of care.

## 2019-03-26 NOTE — Progress Notes (Signed)
OT Cancellation Note  Patient Details Name: Angela David MRN: [id number] DOB: 07-30-1940   Cancelled Treatment:    Reason Eval/Treat Not Completed: Medical issues which prohibited therapy  Pt t/f'ed to CCU this PM d/t increased O2 needs and is currently on ventilator. OT will complete order at this time. Please re-consult as appropriate. Thank you.   Gerrianne Scale, [place], OTR/L ascom [phone] 03/26/19, 4:43 PM

## 2019-03-26 NOTE — Progress Notes (Signed)
CH received page for rapid response on 1A.  At arrival @ Pt.'s rm. medical team attending to pt.  RN said no family are present; RN said pt. would be transferred to ICU; [facility] judged best to follow up post-RR in ICU.  Made referral to [facility] for follow-up after reading she has had prior visits w/Pt.       03/26/19 1330  Clinical Encounter Type  Visited With Patient  Visit Type  (Rapid Response)  Referral From Nurse  Consult/Referral To Chaplain

## 2019-03-26 NOTE — Progress Notes (Signed)
Pt BP 193/82. MD paged via epic TXT. Will continue to monitor pt.

## 2019-03-26 NOTE — Progress Notes (Signed)
PT Cancellation Note  Patient Details Name: Angela David MRN: [id number] DOB: 03-24-1940   Cancelled Treatment:    Reason Eval/Treat Not Completed: Other (comment). Per chart review, pt transferred to CCU this PM, currently on ventilator. PT orders completed at this time due to change in patient status. Please re-consult PT as needed per patient appropriateness to participate with physical therapy.    Lieutenant Diego PT, DPT 4:19 PM,03/26/19

## 2019-03-26 NOTE — Progress Notes (Addendum)
Patient by nursing staff that patient lost consciousness and they had to call rapid response.  When seen, she was minimally responsive, responds to sternal rub with just opening eyes for couple of seconds.  Drooling.  Vital stable.  CBG within normal limit.  No response to deep suctioning.  Patient has CO2 retention with respiratory acidosis.  She needs higher level of care most likely with mechanical ventilation secondary to her altered mental status and chronic trach.  She was transferred to ICU for further management.  The patient is critically ill .  Critical care was necessary to treat or prevent imminent or life-threatening deterioration of acute on chronic respiratory failure,  and was exclusive of separately billable procedures and treating other patients. Total critical care time spent by me: 45-minute. Time spent personally by me on obtaining history from patient or surrogate, evaluation of the patient, evaluation of patient's response to treatment, ordering and review of laboratory studies, ordering and review of radiographic studies, ordering and performing treatments and interventions, and re-evaluation of the patient's condition.

## 2019-03-26 NOTE — Significant Event (Signed)
Rapid Response Event Note  Overview:       Initial Focused Assessment: Rapid response RN arrived in patient's room and patient appeared to be resting comfortably on trach collar with her RN and RT at bedside. Patient had increasing lethargy over this shift and was very difficult to arouse though VSS. Patient was arousable to touch but unable to stay awake. Could answer simple questions. Patient's ABG showed pH of 7.27 and CO2 of 86.   Interventions: none at this time. Patient needs to have trach exchanged for cuffed trach to be able to be placed on the ventilator. Notified Dr. Mortimer Fries (ICU MD) of patient's impending transfer to ICU 14.  Plan of Care (if not transferred): To transfer to ICU 14. This RN left to prepare the room while patient's RN, SWOT RN, and RTs prepped patient for transfer.  Event Summary: Name of Physician Notified: Dr. Reesa Chew at    Name of Consulting Physician Notified: Dr. Mortimer Fries at 1440  Outcome: Transferred (Comment)  Event End Time: [address], [place]

## 2019-03-26 NOTE — Progress Notes (Signed)
Patient titrated to 4l via tracheostomy and sat is at 97% , then titrated to 3L and patients sats remains at 97%

## 2019-03-26 NOTE — Progress Notes (Signed)
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    Patient requires the use of NIV both nightly and daytime to help with exacerbation periods. The use of NIV will treat patient's high PCO2 levels and can reduce the risk of exacerbations in future hospitalizations when used at night and during the day.  Patient will need these advanced settings in conjunction with his current medication regimen :BiPAP with backup rate has been tried and failed.  Failure to have NIV available for use over 24  Could lead to severe respiratory failure and cardiaca arrest and death     Corrin Parker, M.D.  [facility]  Medical Director [place] Director [facility] Cardio-Pulmonary Department

## 2019-03-26 NOTE — Progress Notes (Signed)
CRITICAL CARE NOTE SIGNIFICANT EVENTS/STUDIES:  01/22: Pt with chronic tracheostomy presented to ER with generalized weakness found to be hypercapnic requiring mechanical ventilation  01/22: CT Head revealed no CT evidence for acute intracranial abnormality. Atrophy and presumed chronic small vessel ischemic change of the white matter 01/22: CTA Chest revealed no pulmonary embolus. Dilated fluid-filled esophagus from the thoracic inlet to the gastroesophageal junction, may be reflux or dysmotility. Achalasia also considered. Mild dependent opacities in both lower lobes, likely atelectasis. Given esophageal dilatation, aspiration is also considered. Subacute right rib fractures. Some interval incomplete fracture healing since October 2020 injury. Stable right upper lobe scarring and benign pulmonary nodules. Aortic Atherosclerosis (ICD10-I70.0). 01/23: Pt admitted to ICU  03/22/19- Patient is clincally improved, of mechanical ventilation.  Starting PT/OT with plan to transition to hospitalist service. 1/28 returned back to ICU  Patient by nursing staff that patient lost consciousness and they had to call rapid response.  When seen, she was minimally responsive, responds to sternal rub with just opening eyes for couple of seconds.  Drooling.  Vital stable.  CBG within normal limit.  No response to deep suctioning.  Patient has CO2 retention with respiratory acidosis.  She needs higher level of care most likely with mechanical ventilation secondary to her altered mental status and chronic trach.  She was transferred to ICU for further management    CC  follow up respiratory failure  HPI Trach changed to #6 [place] with CUFF Placed on PRVC mode   BP (!) 132/91 (BP Location: Left Arm)   Pulse 99   Temp 97.8 F (36.6 C) (Oral)   Resp 15   Ht 5\' 3"  (1.6 m)   Wt 68 kg   SpO2 95%   BMI 26.56 kg/m    I/O last 3 completed shifts: In: 517.5 [IV [id number] Out: 300 [Urine:300] No  intake/output data recorded.  SpO2: 95 % O2 Flow Rate (L/min): 5 L/min FiO2 (%): 28 %    REVIEW OF SYSTEMS  PATIENT IS UNABLE TO PROVIDE COMPLETE REVIEW OF SYSTEMS DUE TO SEVERE CRITICAL ILLNESS   PHYSICAL EXAMINATION:  GENERAL:critically ill appearing, +resp distress HEAD: Normocephalic, atraumatic.  EYES: Pupils equal, round, reactive to light.  No scleral icterus.  MOUTH: Moist mucosal membrane. NECK: Supple.  PULMONARY: +rhonchi, +wheezing CARDIOVASCULAR: S1 and S2. Regular rate and rhythm. No murmurs, rubs, or gallops.  GASTROINTESTINAL: Soft, nontender, -distended.  Positive bowel sounds.   MUSCULOSKELETAL: No swelling, clubbing, or edema.  NEUROLOGIC: obtunded, GCS<8 SKIN:intact,warm,dry  MEDICATIONS: I have reviewed all medications and confirmed regimen as documented   CULTURE RESULTS   Recent Results (from the past 240 hour(s))  Blood Culture (routine x 2)     Status: None   Collection Time: 03/20/19  7:39 PM   Specimen: BLOOD  Result Value Ref Range Status   Specimen Description BLOOD BLOOD LEFT FOREARM  Final   Special Requests   Final    BOTTLES DRAWN AEROBIC AND ANAEROBIC Blood Culture adequate volume   Culture   Final    NO GROWTH 5 DAYS Performed at [facility], [address] [place], [place] [address]    Report Status 03/25/2019 FINAL  Final  Blood Culture (routine x 2)     Status: None   Collection Time: 03/20/19  7:39 PM   Specimen: BLOOD  Result Value Ref Range Status   Specimen Description BLOOD LEFT ANTECUBITAL  Final   Special Requests   Final    BOTTLES DRAWN AEROBIC AND ANAEROBIC Blood  Culture adequate volume   Culture   Final    NO GROWTH 5 DAYS Performed at [facility], [address] [place], [place] [address]    Report Status 03/25/2019 FINAL  Final  Respiratory Panel by RT PCR (Flu A&B, Covid) - Nasopharyngeal Swab     Status: None   Collection Time: 03/20/19  7:39 PM   Specimen: Nasopharyngeal Swab   Result Value Ref Range Status   SARS Coronavirus 2 by RT PCR NEGATIVE NEGATIVE Final    Comment: (NOTE) SARS-CoV-2 target nucleic acids are NOT DETECTED. The SARS-CoV-2 RNA is generally detectable in upper respiratoy specimens during the acute phase of infection. The lowest concentration of SARS-CoV-2 viral copies this assay can detect is 131 copies/mL. A negative result does not preclude SARS-Cov-2 infection and should not be used as the sole basis for treatment or other patient management decisions. A negative result may occur with  improper specimen collection/handling, submission of specimen other than nasopharyngeal swab, presence of viral mutation(s) within the areas targeted by this assay, and inadequate number of viral copies (<131 copies/mL). A negative result must be combined with clinical observations, patient history, and epidemiological information. The expected result is Negative. Fact Sheet for Patients:  PinkCheek.be Fact Sheet for Healthcare Providers:  GravelBags.it This test is not yet ap proved or cleared by the [place] FDA and  has been authorized for detection and/or diagnosis of SARS-CoV-2 by FDA under an Emergency Use Authorization (EUA). This EUA will remain  in effect (meaning this test can be used) for the duration of the COVID-19 declaration under Section 564(b)(1) of the Act, 21 U.S.C. section 360bbb-3(b)(1), unless the authorization is terminated or revoked sooner.    Influenza A by PCR NEGATIVE NEGATIVE Final   Influenza B by PCR NEGATIVE NEGATIVE Final    Comment: (NOTE) The Xpert Xpress SARS-CoV-2/FLU/RSV assay is intended as an aid in  the diagnosis of influenza from Nasopharyngeal swab specimens and  should not be used as a sole basis for treatment. Nasal washings and  aspirates are unacceptable for Xpert Xpress SARS-CoV-2/FLU/RSV  testing. Fact Sheet for  Patients: PinkCheek.be Fact Sheet for Healthcare Providers: GravelBags.it This test is not yet approved or cleared by the [place] FDA and  has been authorized for detection and/or diagnosis of SARS-CoV-2 by  FDA under an Emergency Use Authorization (EUA). This EUA will remain  in effect (meaning this test can be used) for the duration of the  Covid-19 declaration under Section 564(b)(1) of the Act, 21  U.S.C. section 360bbb-3(b)(1), unless the authorization is  terminated or revoked. Performed at [facility], [address] [place], [place] [address]   Culture, respiratory     Status: None   Collection Time: 03/20/19  9:45 PM   Specimen: Tracheal Aspirate  Result Value Ref Range Status   Specimen Description   Final    TRACHEAL ASPIRATE Performed at [facility], [address], [place], [place] [address]    Special Requests   Final    NONE Performed at [facility], [address] [place], [place] [address]    Gram Stain   Final    ABUNDANT WBC PRESENT, PREDOMINANTLY PMN RARE GRAM POSITIVE COCCI Performed at [facility], [place] [address] [place], [place] [address]    Culture RARE METHICILLIN RESISTANT STAPHYLOCOCCUS AUREUS  Final   Report Status 03/23/2019 FINAL  Final   Organism ID, Bacteria METHICILLIN RESISTANT STAPHYLOCOCCUS AUREUS  Final  Susceptibility   Methicillin resistant staphylococcus aureus - MIC*    CIPROFLOXACIN >=8 RESISTANT Resistant     ERYTHROMYCIN >=8 RESISTANT Resistant     GENTAMICIN <=0.5 SENSITIVE Sensitive     OXACILLIN >=4 RESISTANT Resistant     TETRACYCLINE <=1 SENSITIVE Sensitive     VANCOMYCIN 1 SENSITIVE Sensitive     TRIMETH/SULFA <=10 SENSITIVE Sensitive     CLINDAMYCIN RESISTANT Resistant     RIFAMPIN <=0.5 SENSITIVE Sensitive     Inducible Clindamycin POSITIVE Resistant     * RARE METHICILLIN RESISTANT STAPHYLOCOCCUS AUREUS   Culture, respiratory (tracheal aspirate)     Status: None   Collection Time: 03/21/19  4:43 AM   Specimen: Tracheal Aspirate; Respiratory  Result Value Ref Range Status   Specimen Description   Final    TRACHEAL ASPIRATE Performed at [facility], [address] [place], [place] [address]    Special Requests   Final    NONE Performed at [facility], [address] [place], [place] [address]    Gram Stain   Final    ABUNDANT WBC PRESENT, PREDOMINANTLY PMN NO ORGANISMS SEEN    Culture   Final    RARE Consistent with normal respiratory flora. Performed at [facility], [place] [address] [place], [place] [address]    Report Status 03/23/2019 FINAL  Final            ASSESSMENT AND PLAN SYNOPSIS   Severe ACUTE Hypoxic and Hypercapnic Respiratory Failure with  MRSA pneumonia  Obtain CXR Patient needs cuffed trach at this time    NEUROLOGY - intubated and sedated - minimal sedation to achieve a RASS goal: -1 Wake up assessment pending   CARDIAC ICU monitoring  ID -continue IV abx as prescibed -follow up cultures  GI GI PROPHYLAXIS as indicated  NUTRITIONAL STATUS DIET-->NPO Constipation protocol as indicated  ENDO - will use ICU hypoglycemic\Hyperglycemia protocol if indicated   ELECTROLYTES -follow labs as needed -replace as needed -pharmacy consultation and following   DVT/GI PRX ordered TRANSFUSIONS AS NEEDED MONITOR FSBS ASSESS the need for LABS as needed    prognosis is guarded.    Corrin Parker, M.D.  [facility]  Medical Director [place] Director [facility] Cardio-Pulmonary Department

## 2019-03-27 ENCOUNTER — Other Ambulatory Visit: Payer: Self-pay

## 2019-03-27 DIAGNOSIS — J9602 Acute respiratory failure with hypercapnia: Secondary | ICD-10-CM

## 2019-03-27 DIAGNOSIS — J9601 Acute respiratory failure with hypoxia: Secondary | ICD-10-CM

## 2019-03-27 DIAGNOSIS — G9340 Encephalopathy, unspecified: Secondary | ICD-10-CM

## 2019-03-27 LAB — BASIC METABOLIC PANEL
Anion gap: 11 (ref 5–15)
BUN: 13 mg/dL (ref 8–23)
CO2: 30 mmol/L (ref 22–32)
Calcium: 10 mg/dL (ref 8.9–10.3)
Chloride: 95 mmol/L — ABNORMAL LOW (ref 98–111)
Creatinine, Ser: 1.11 mg/dL — ABNORMAL HIGH (ref 0.44–1.00)
GFR calc Af Amer: 56 mL/min — ABNORMAL LOW (ref >60)
GFR calc non Af Amer: 48 mL/min — ABNORMAL LOW (ref >60)
Glucose, Bld: 127 mg/dL — ABNORMAL HIGH (ref 70–99)
Potassium: 3.7 mmol/L (ref 3.5–5.1)
Sodium: 136 mmol/L (ref 135–145)

## 2019-03-27 LAB — GLUCOSE, CAPILLARY
Glucose-Capillary: 121 mg/dL — ABNORMAL HIGH (ref 70–99)
Glucose-Capillary: 140 mg/dL — ABNORMAL HIGH (ref 70–99)
Glucose-Capillary: 143 mg/dL — ABNORMAL HIGH (ref 70–99)
Glucose-Capillary: 150 mg/dL — ABNORMAL HIGH (ref 70–99)
Glucose-Capillary: 83 mg/dL (ref 70–99)
Glucose-Capillary: 84 mg/dL (ref 70–99)

## 2019-03-27 LAB — CBC
HCT: 42.7 % (ref 36.0–46.0)
Hemoglobin: 13.6 g/dL (ref 12.0–15.0)
MCH: 25.5 pg — ABNORMAL LOW (ref 26.0–34.0)
MCHC: 31.9 g/dL (ref 30.0–36.0)
MCV: 80 fL (ref 80.0–100.0)
Platelets: 227 10*3/uL (ref 150–400)
RBC: 5.34 MIL/uL — ABNORMAL HIGH (ref 3.87–5.11)
RDW: 13.9 % (ref 11.5–15.5)
WBC: 10.6 10*3/uL — ABNORMAL HIGH (ref 4.0–10.5)
nRBC: 0 % (ref 0.0–0.2)

## 2019-03-27 LAB — VANCOMYCIN, TROUGH: Vancomycin Tr: 13 ug/mL — ABNORMAL LOW (ref 15–20)

## 2019-03-27 LAB — MAGNESIUM: Magnesium: 2 mg/dL (ref 1.7–2.4)

## 2019-03-27 LAB — CREATININE, SERUM
Creatinine, Ser: 1.17 mg/dL — ABNORMAL HIGH (ref 0.44–1.00)
GFR calc Af Amer: 52 mL/min — ABNORMAL LOW (ref >60)
GFR calc non Af Amer: 45 mL/min — ABNORMAL LOW (ref >60)

## 2019-03-27 LAB — PHOSPHORUS: Phosphorus: 1.8 mg/dL — ABNORMAL LOW (ref 2.5–4.6)

## 2019-03-27 MED ORDER — VANCOMYCIN HCL IN DEXTROSE 1-5 GM/200ML-% IV SOLN
1000.0000 mg | INTRAVENOUS | Status: AC
  Administered 2019-03-27 – 2019-03-29 (×3): 1000 mg via INTRAVENOUS
  Filled 2019-03-27 (×4): qty 200

## 2019-03-27 MED ORDER — ESCITALOPRAM OXALATE 10 MG PO TABS
20.0000 mg | ORAL_TABLET | Freq: Every day | ORAL | Status: DC
  Administered 2019-03-27 – 2019-03-28 (×2): 20 mg via ORAL
  Filled 2019-03-27 (×3): qty 2

## 2019-03-27 MED ORDER — GABAPENTIN 600 MG PO TABS
600.0000 mg | ORAL_TABLET | Freq: Three times a day (TID) | ORAL | Status: DC
  Administered 2019-03-27 – 2019-03-28 (×4): 600 mg via ORAL
  Filled 2019-03-27 (×5): qty 1

## 2019-03-27 MED ORDER — BUSPIRONE HCL 15 MG PO TABS
30.0000 mg | ORAL_TABLET | Freq: Two times a day (BID) | ORAL | Status: DC
  Administered 2019-03-27 – 2019-03-28 (×3): 30 mg via ORAL
  Filled 2019-03-27 (×4): qty 2

## 2019-03-27 MED ORDER — ROPINIROLE HCL 0.25 MG PO TABS
0.5000 mg | ORAL_TABLET | Freq: Every day | ORAL | Status: DC
  Administered 2019-03-27 – 2019-03-31 (×4): 0.5 mg via ORAL
  Filled 2019-03-27 (×6): qty 2

## 2019-03-27 MED ORDER — POTASSIUM PHOSPHATES 15 MMOLE/5ML IV SOLN
30.0000 mmol | Freq: Once | INTRAVENOUS | Status: AC
  Administered 2019-03-27: 17:00:00 30 mmol via INTRAVENOUS
  Filled 2019-03-27: qty 10

## 2019-03-27 MED ORDER — PANTOPRAZOLE SODIUM 40 MG IV SOLR
40.0000 mg | Freq: Every day | INTRAVENOUS | Status: DC
  Administered 2019-03-27 – 2019-03-29 (×3): 40 mg via INTRAVENOUS
  Filled 2019-03-27 (×3): qty 40

## 2019-03-27 MED ORDER — MELATONIN 5 MG PO TABS
5.0000 mg | ORAL_TABLET | Freq: Every day | ORAL | Status: DC
  Administered 2019-03-27 – 2019-03-31 (×4): 5 mg via ORAL
  Filled 2019-03-27 (×4): qty 1

## 2019-03-27 MED ORDER — ENSURE ENLIVE PO LIQD
237.0000 mL | Freq: Two times a day (BID) | ORAL | Status: DC
  Administered 2019-03-27 – 2019-04-01 (×10): 237 mL via ORAL

## 2019-03-27 NOTE — Progress Notes (Signed)
Patient watched while eating lunch. She ate approximately 35%. No signs of aspiration. Husband will be visiting tonight at some point. Home ventilator ordered and teaching to be done with husband

## 2019-03-27 NOTE — Progress Notes (Signed)
SLP Cancellation Note  Patient Details Name: Angela David MRN: [id number] DOB: [birth year]   Cancelled treatment:       Reason Eval/Treat Not Completed: (chart reviewed; pt transferred to CCU). Pt was transferred to CCU d/t decline in status yesterday pm - PCO2 elevation and need for ventilation support. She is now off vent on TC O2 support as at baseline; awake/alert. MD has reinstated her oral diet post SLP discussion w/ MD re: oropharyngeal swallowing vs Esophageal phase Dysmotility/Achalasia and risk for Retrograde activity. Pt exhibits adequate oropharyngeal phase swallowing and toleration of diet but there is risk for Retrograde activity and Reflux activity.  Recommend continue w/ the [place] for Esophageal support; Thin liquids. General aspiration and Strict REFLUX precautions. ST services will sign off at this time d/t transfer of unit; please re-consult as appropriate if any new needs. Thank you.     Orinda Kenner, MS, CCC-SLP Shaneya Taketa 03/27/2019, 3:03 PM

## 2019-03-27 NOTE — Progress Notes (Signed)
CRITICAL CARE NOTE SIGNIFICANT EVENTS/STUDIES:  01/22: Pt with chronic tracheostomy presented to ER with generalized weakness found to be hypercapnic requiring mechanical ventilation  01/22: CT Head revealed no CT evidence for acute intracranial abnormality. Atrophy and presumed chronic small vessel ischemic change of the white matter 01/22: CTA Chest revealed no pulmonary embolus. Dilated fluid-filled esophagus from the thoracic inlet to the gastroesophageal junction, may be reflux or dysmotility. Achalasia also considered. Mild dependent opacities in both lower lobes, likely atelectasis. Given esophageal dilatation, aspiration is also considered. Subacute right rib fractures. Some interval incomplete fracture healing since October 2020 injury. Stable right upper lobe scarring and benign pulmonary nodules. Aortic Atherosclerosis (ICD10-I70.0). 01/23: Pt admitted to ICU  03/22/19- Patient is clincally improved, of mechanical ventilation.  Starting PT/OT with plan to transition to hospitalist service. 1/28 returned back to ICU  Patient by nursing staff that patient lost consciousness and they had to call rapid response.  When seen, she was minimally responsive, responds to sternal rub with just opening eyes for couple of seconds.  Drooling.  Vital stable.  CBG within normal limit.  No response to deep suctioning.  Patient has CO2 retention with respiratory acidosis.  She needs higher level of care most likely with mechanical ventilation secondary to her altered mental status and chronic trach.  She was transferred to ICU for further management    CC  Follow up resp failure  HPI Off vent, alert and awake Eating Husband Will need VENT training    BP (!) 129/93 (BP Location: Left Arm)   Pulse (!) 104   Temp 98.4 F (36.9 C) (Oral)   Resp (!) 24   Ht 5\' 3"  (1.6 m)   Wt 68 kg   SpO2 99%   BMI 26.56 kg/m    I/O last 3 completed shifts: In: 1049.9 [I.V.:849.9; IV  Piggyback:200] Out: 800 [Urine:800] Total I/O In: -  Out: 300 [Urine:300]  SpO2: 99 % O2 Flow Rate (L/min): 5 L/min FiO2 (%): 28 %    Review of Systems:  Gen:  Denies  fever, sweats, chills weight loss  HEENT: Denies blurred vision, double vision, ear pain, eye pain, hearing loss, nose bleeds, sore throat Cardiac:  No dizziness, chest pain or heaviness, chest tightness,edema, No JVD Resp:   No cough, -sputum production, -shortness of breath,-wheezing, -hemoptysis,  Other:  All other systems negative   Physical Examination:   GENERAL:NAD, no fevers, chills, no weakness no fatigue HEAD: Normocephalic, atraumatic.  EYES: PERLA, EOMI No scleral icterus.  MOUTH: Moist mucosal membrane.  EAR, NOSE, THROAT: Clear without exudates. No external lesions. S/p trach  NECK: Supple. No thyromegaly.  No JVD.  PULMONARY: CTA B/L no wheezing, rhonchi, crackles CARDIOVASCULAR: S1 and S2. Regular rate and rhythm. No murmurs GASTROINTESTINAL: Soft, nontender, nondistended. Positive bowel sounds.  MUSCULOSKELETAL: No swelling, clubbing, or edema.  NEUROLOGIC: No gross focal neurological deficits. 5/5 strength all extremities SKIN: No ulceration, lesions, rashes, or cyanosis.  PSYCHIATRIC: Insight, judgment intact. -depression -anxiety ALL OTHER ROS ARE NEGATIVE      MEDICATIONS: I have reviewed all medications and confirmed regimen as documented   CULTURE RESULTS   Recent Results (from the past 240 hour(s))  Blood Culture (routine x 2)     Status: None   Collection Time: 03/20/19  7:39 PM   Specimen: BLOOD  Result Value Ref Range Status   Specimen Description BLOOD BLOOD LEFT FOREARM  Final   Special Requests   Final    BOTTLES DRAWN AEROBIC AND  ANAEROBIC Blood Culture adequate volume   Culture   Final    NO GROWTH 5 DAYS Performed at [facility], [address] [place], [place] [address]    Report Status 03/25/2019 FINAL  Final  Blood Culture (routine x 2)      Status: None   Collection Time: 03/20/19  7:39 PM   Specimen: BLOOD  Result Value Ref Range Status   Specimen Description BLOOD LEFT ANTECUBITAL  Final   Special Requests   Final    BOTTLES DRAWN AEROBIC AND ANAEROBIC Blood Culture adequate volume   Culture   Final    NO GROWTH 5 DAYS Performed at [facility], [address], [place], [place] [address]    Report Status 03/25/2019 FINAL  Final  Respiratory Panel by RT PCR (Flu A&B, Covid) - Nasopharyngeal Swab     Status: None   Collection Time: 03/20/19  7:39 PM   Specimen: Nasopharyngeal Swab  Result Value Ref Range Status   SARS Coronavirus 2 by RT PCR NEGATIVE NEGATIVE Final    Comment: (NOTE) SARS-CoV-2 target nucleic acids are NOT DETECTED. The SARS-CoV-2 RNA is generally detectable in upper respiratoy specimens during the acute phase of infection. The lowest concentration of SARS-CoV-2 viral copies this assay can detect is 131 copies/mL. A negative result does not preclude SARS-Cov-2 infection and should not be used as the sole basis for treatment or other patient management decisions. A negative result may occur with  improper specimen collection/handling, submission of specimen other than nasopharyngeal swab, presence of viral mutation(s) within the areas targeted by this assay, and inadequate number of viral copies (<131 copies/mL). A negative result must be combined with clinical observations, patient history, and epidemiological information. The expected result is Negative. Fact Sheet for Patients:  PinkCheek.be Fact Sheet for Healthcare Providers:  GravelBags.it This test is not yet ap proved or cleared by the [place] FDA and  has been authorized for detection and/or diagnosis of SARS-CoV-2 by FDA under an Emergency Use Authorization (EUA). This EUA will remain  in effect (meaning this test can be used) for the duration of the COVID-19  declaration under Section 564(b)(1) of the Act, 21 U.S.C. section 360bbb-3(b)(1), unless the authorization is terminated or revoked sooner.    Influenza A by PCR NEGATIVE NEGATIVE Final   Influenza B by PCR NEGATIVE NEGATIVE Final    Comment: (NOTE) The Xpert Xpress SARS-CoV-2/FLU/RSV assay is intended as an aid in  the diagnosis of influenza from Nasopharyngeal swab specimens and  should not be used as a sole basis for treatment. Nasal washings and  aspirates are unacceptable for Xpert Xpress SARS-CoV-2/FLU/RSV  testing. Fact Sheet for Patients: PinkCheek.be Fact Sheet for Healthcare Providers: GravelBags.it This test is not yet approved or cleared by the [place] FDA and  has been authorized for detection and/or diagnosis of SARS-CoV-2 by  FDA under an Emergency Use Authorization (EUA). This EUA will remain  in effect (meaning this test can be used) for the duration of the  Covid-19 declaration under Section 564(b)(1) of the Act, 21  U.S.C. section 360bbb-3(b)(1), unless the authorization is  terminated or revoked. Performed at [facility], [address] [place], [place] [address]   Culture, respiratory     Status: None   Collection Time: 03/20/19  9:45 PM   Specimen: Tracheal Aspirate  Result Value Ref Range Status   Specimen Description   Final    TRACHEAL ASPIRATE Performed at [facility], [address] [place], [place] [address]    Special Requests   Final    NONE Performed at [facility], [place], [place] [address]    Gram Stain   Final    ABUNDANT WBC PRESENT, PREDOMINANTLY PMN RARE GRAM POSITIVE COCCI Performed at [facility], [place] [address], [place], [place] [address]    Culture RARE METHICILLIN RESISTANT STAPHYLOCOCCUS AUREUS  Final   Report Status 03/23/2019 FINAL  Final   Organism ID, Bacteria METHICILLIN RESISTANT STAPHYLOCOCCUS AUREUS   Final      Susceptibility   Methicillin resistant staphylococcus aureus - MIC*    CIPROFLOXACIN >=8 RESISTANT Resistant     ERYTHROMYCIN >=8 RESISTANT Resistant     GENTAMICIN <=0.5 SENSITIVE Sensitive     OXACILLIN >=4 RESISTANT Resistant     TETRACYCLINE <=1 SENSITIVE Sensitive     VANCOMYCIN 1 SENSITIVE Sensitive     TRIMETH/SULFA <=10 SENSITIVE Sensitive     CLINDAMYCIN RESISTANT Resistant     RIFAMPIN <=0.5 SENSITIVE Sensitive     Inducible Clindamycin POSITIVE Resistant     * RARE METHICILLIN RESISTANT STAPHYLOCOCCUS AUREUS  Culture, respiratory (tracheal aspirate)     Status: None   Collection Time: 03/21/19  4:43 AM   Specimen: Tracheal Aspirate; Respiratory  Result Value Ref Range Status   Specimen Description   Final    TRACHEAL ASPIRATE Performed at [facility], [address] [place], [place] [address]    Special Requests   Final    NONE Performed at [facility], [address] [place], [place] [address]    Gram Stain   Final    ABUNDANT WBC PRESENT, PREDOMINANTLY PMN NO ORGANISMS SEEN    Culture   Final    RARE Consistent with normal respiratory flora. Performed at [facility], [place] [address] [place], [place] [address]    Report Status 03/23/2019 FINAL  Final            ASSESSMENT AND PLAN SYNOPSIS  Severe ACUTE Hypoxic and Hypercapnic Respiratory Failure from MRSA pneumonia with h/o COPD and Hypercapnic resp failure with vocal cord dysfunction Wean fio2 Vent at night as needed  ID -continue IV abx as prescibed -follow up cultures  GI GI PROPHYLAXIS as indicated  NUTRITIONAL STATUS DIET-->oral intake of food as tolerated Constipation protocol as indicated  ENDO - will use ICU hypoglycemic\Hyperglycemia protocol if indicated   ELECTROLYTES -follow labs as needed -replace as needed -pharmacy consultation and following   DVT/GI PRX ordered TRANSFUSIONS AS NEEDED MONITOR FSBS ASSESS the need for LABS as  needed    prognosis is guarded.    Corrin Parker, M.D.  [facility]  Medical Director [place] Director [facility] Cardio-Pulmonary Department

## 2019-03-27 NOTE — Progress Notes (Signed)
Initial Nutrition Assessment  DOCUMENTATION CODES:   Not applicable  INTERVENTION:   Ensure Enlive po BID, each supplement provides 350 kcal and 20 grams of protein  Magic cup TID with meals, each supplement provides 290 kcal and 9 grams of protein'  NUTRITION DIAGNOSIS:   Increased nutrient needs related to chronic illness(COPD) as evidenced by increased estimated needs.  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  She presented to [facility] ER on 01/22 via EMS from home with worsening generalized weakness throughout the day. Pt found to have MRSA PNA   Met with pt in room today. Pt reports good appetite and oral intake at baseline. Pt ate 50% of a chocolate pudding and 50% of a yogurt for lunch today. Pt did not like the potato soup on her tray. Pt reports that she does enjoy drinking chocolate Ensure. Pt eats a mechanical soft diet at home. RD will add supplements to help pt meet her estimated needs. Per chart, pt is weight stable at baseline.   Medications reviewed and include: lovenox, insulin, protonix, LRS _0 /hr  Labs reviewed: K 3.7 wnl, creat 1.11(H), P 1.8(L), Mg 2.0 wnl Wbc- 10.6(H)  NUTRITION - FOCUSED PHYSICAL EXAM:    Most Recent Value  Orbital Region  No depletion  Upper Arm Region  No depletion  Thoracic and Lumbar  Region  No depletion  Buccal Region  No depletion  Temple Region  No depletion  Clavicle Bone Region  Mild depletion  Clavicle and Acromion Bone Region  Mild depletion  Scapular Bone Region  No depletion  Dorsal Hand  Mild depletion  Patellar Region  No depletion  Anterior Thigh Region  No depletion  Posterior Calf Region  No depletion  Edema (RD Assessment)  None  Hair  Reviewed  Eyes  Reviewed  Mouth  Reviewed  Skin  Reviewed  Nails  Reviewed     Diet Order:   Diet Order            DIET DYS 3 Room service appropriate? Yes with Assist; Fluid consistency: Thin  Diet effective now             EDUCATION NEEDS:   Education needs have been addressed  Skin:  Skin Assessment: Reviewed RN Assessment  Last BM:  1/26  Height:   Ht Readings from Last 1 Encounters:  03/20/19 _1  (1.6 m)    Weight:   Wt Readings from Last 1 Encounters:  03/26/19 68 kg    Ideal Body Weight:  52.3 kg  BMI:  Body mass index is 26.56 kg/m.  Estimated Nutritional Needs:   Kcal:  1500-1700kcal/day  Protein:  75-85g/day  Fluid:  >1.3L/day  Koleen Distance MS, RD, LDN Pager #- [phone] Office#- [phone] After Hours Pager: [phone]

## 2019-03-27 NOTE — Consult Note (Signed)
PHARMACY CONSULT NOTE - FOLLOW UP  Pharmacy Consult for Electrolyte Monitoring and Replacement   Angela David is a 79 y.o. female admitted on 03/20/2019 with generalized weakness, hypotension secondary to hypovolemia vs sepsis, acute hypoxic respiratory failure. 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 Pt is off ventilator and on a thin liquid diet. Feeding supplements BID between meals started today. Pt is/may be having difficulty swallowing. Pharmacy consulted to assist with monitoring and replacing electrolytes.   Recent Labs: Potassium (mmol/L)  Date Value  03/27/2019 3.7  12/17/2011 4.1   Magnesium (mg/dL)  Date Value  03/27/2019 2.0  12/17/2011 2.2   Calcium (mg/dL)  Date Value  03/27/2019 10.0   Calcium, Total (mg/dL)  Date Value  12/17/2011 8.8   Albumin (g/dL)  Date Value  03/26/2019 2.8 (L)  08/28/2016 4.3  12/16/2011 4.1   Phosphorus (mg/dL)  Date Value  03/27/2019 1.8 (L)  12/16/2011 2.2 (L)   Sodium (mmol/L)  Date Value  03/27/2019 136  08/28/2016 143  12/17/2011 139     Assessment: 1. Electrolytes: Electrolytes WNL, except for phosphorus (L). Corrected calcium is 10.9 (H). Pt is on a LR IV infusion, started today. Pt received 1 dose of magnesium 2g IV today. Potassium phosphate IV 30 mmol x 1 dose. Will monitor electrolytes in am labs. Will replace electrolytes to achieve target for potassium ~4, sodium ~140, and magnesium ~2.  2. Glucose: Today's range: 83-127. Glucose levels are slightly higher than yesterday. Glucose-capillary monitored Q4H. Pt is on insulin aspart 0-9 units [place] Q4H. Continue to monitor glucose levels daily and administration insulins.   Roanna Banning ,PharmD Candidate 03/27/2019 2:55 PM

## 2019-03-27 NOTE — Progress Notes (Signed)
Patient placed on 28% ATC. Patient more alert this am and interacting with staff. Dr. Mortimer Fries and RN notified. Sats 97%.

## 2019-03-27 NOTE — Progress Notes (Signed)
Pharmacy Antibiotic Note  Angela David is a 79 y.o. female admitted on 03/20/2019 with generalized weakness, hypotension secondary to hypovolemia vs sepsis, acute hypoxic respiratory failure. 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 Pt is now weaned off mechanical ventilator. Respiratory cultures (tracheal aspirate) from 01/22 indicate growth of MRSA. One out of two BCx showed MRSA growth. However due to increased thick mucus secretions, MD recommended to keep the antibiotic therapy going. There was an increase Scr today. Trough levels today were 13. Dose was not increased since WBCs are WNL and afebrile. Pharmacy has been consulted for vancomycin dosing.  Plan: Will continue Vancomycin IV 1000mg  Q24H (maintenance dose)  Scr 0.80 AUC Goal 400-550 Calculated AUC 445  Total antibiotic duration: 7 days Days of antibiotic therapy so far: 4  Will monitor pt clinical presentation, labs and cultures to assess need for vancomycin. Will monitor serum creatinine tomorrow.   Height: 5\' 3"  (160 cm) Weight: 149 lb 14.6 oz (68 kg) IBW/kg (Calculated) : 52.4  Temp (24hrs), Avg:98.5 F (36.9 C), Min:97.8 F (36.6 C), Max:99.1 F (37.3 C)  Recent Labs  Lab 03/20/19 1939 03/20/19 1939 03/21/19 0324 03/21/19 0324 03/22/19 0445 03/22/19 0445 03/23/19 0422 03/23/19 0422 03/24/19 0537 03/25/19 0443 03/26/19 2144 03/27/19 0542 03/27/19 1102  WBC 13.3*   < > 12.2*  --  9.0  --  11.2*  --  9.8  --   --   --  10.6*  CREATININE 0.74   < > 0.87   < > 0.89   < > 0.79   < > 0.65 0.73 0.82 1.17* 1.11*  LATICACIDVEN 0.9  --   --   --   --   --   --   --   --   --   --   --   --   VANCOTROUGH  --   --   --   --   --   --   --   --   --   --   --   --  13*   < > = values in this interval not  displayed.    Estimated Creatinine Clearance: 39.9 mL/min (A) (by C-G formula based on SCr of 1.11 mg/dL (H)).    Allergies  Allergen Reactions  . Shellfish Allergy Shortness Of Breath    respitatory distress   . Sodium Sulfite Shortness Of Breath    Severe Congestion that creates inability to breath  . Codeine Other (See Comments)    Per patient "it kept her up all night"    Antimicrobials this admission: Cefepime 2g IV 01/23 >> 01/25 Vancomycin 1g IV 01/23 >> 01/23 Vancomycin 1500mg  IV (loading dose) 01/25 >> 01/25 Vancomycin  01/26 >>    Microbiology results: 01/22 BCx: No growth 5 days 01/22 BCx: No growth 5 days 01/22 Respiratory Culture (Tracheal Aspirate): Rare MRSA (finalized 01/25) 01/23 Respiratory Culture (Tracheal Aspirate): Rare consistent with normal respiratory flora (finalized 01/25) 01/22 SARS Coronavirus by PCR: Negative 01/22 Influenza A/B by PCR: Negative  Thank you for allowing pharmacy to be a part of this patient's care.  Roanna Banning, PharmD Candidate 03/27/2019 1:57 PM

## 2019-03-27 NOTE — Progress Notes (Signed)
[place] attempted visit as follow-up from other chaplains' visits; pt. in bed w/trach tube and apparently asleep.  [place] will return later if possible.      03/27/19 1600  Clinical Encounter Type  Visited With Patient not available  Visit Type Follow-up

## 2019-03-28 ENCOUNTER — Inpatient Hospital Stay: Payer: Medicare Other

## 2019-03-28 LAB — CBC WITH DIFFERENTIAL/PLATELET
Abs Immature Granulocytes: 0.06 10*3/uL (ref 0.00–0.07)
Basophils Absolute: 0 10*3/uL (ref 0.0–0.1)
Basophils Relative: 0 %
Eosinophils Absolute: 0.1 10*3/uL (ref 0.0–0.5)
Eosinophils Relative: 1 %
HCT: 40.1 % (ref 36.0–46.0)
Hemoglobin: 12.5 g/dL (ref 12.0–15.0)
Immature Granulocytes: 1 %
Lymphocytes Relative: 8 %
Lymphs Abs: 1 10*3/uL (ref 0.7–4.0)
MCH: 25.5 pg — ABNORMAL LOW (ref 26.0–34.0)
MCHC: 31.2 g/dL (ref 30.0–36.0)
MCV: 81.8 fL (ref 80.0–100.0)
Monocytes Absolute: 0.8 10*3/uL (ref 0.1–1.0)
Monocytes Relative: 7 %
Neutro Abs: 10.7 10*3/uL — ABNORMAL HIGH (ref 1.7–7.7)
Neutrophils Relative %: 83 %
Platelets: 247 10*3/uL (ref 150–400)
RBC: 4.9 MIL/uL (ref 3.87–5.11)
RDW: 13.9 % (ref 11.5–15.5)
WBC: 12.7 10*3/uL — ABNORMAL HIGH (ref 4.0–10.5)
nRBC: 0 % (ref 0.0–0.2)

## 2019-03-28 LAB — BASIC METABOLIC PANEL
Anion gap: 9 (ref 5–15)
BUN: 12 mg/dL (ref 8–23)
CO2: 33 mmol/L — ABNORMAL HIGH (ref 22–32)
Calcium: 9.7 mg/dL (ref 8.9–10.3)
Chloride: 99 mmol/L (ref 98–111)
Creatinine, Ser: 0.94 mg/dL (ref 0.44–1.00)
GFR calc Af Amer: >60 mL/min (ref >60)
GFR calc non Af Amer: 59 mL/min — ABNORMAL LOW (ref >60)
Glucose, Bld: 156 mg/dL — ABNORMAL HIGH (ref 70–99)
Potassium: 3.8 mmol/L (ref 3.5–5.1)
Sodium: 141 mmol/L (ref 135–145)

## 2019-03-28 LAB — BLOOD GAS, ARTERIAL
Acid-Base Excess: 11.6 mmol/L — ABNORMAL HIGH (ref 0.0–2.0)
Bicarbonate: 35 mmol/L — ABNORMAL HIGH (ref 20.0–28.0)
FIO2: 0.28
MECHVT: 450 mL
O2 Saturation: 97.6 %
PEEP: 5 cmH2O
Patient temperature: 37
RATE: 16 resp/min
pCO2 arterial: 40 mmHg (ref 32.0–48.0)
pH, Arterial: 7.55 — ABNORMAL HIGH (ref 7.350–7.450)
pO2, Arterial: 85 mmHg (ref 83.0–108.0)

## 2019-03-28 LAB — GLUCOSE, CAPILLARY
Glucose-Capillary: 133 mg/dL — ABNORMAL HIGH (ref 70–99)
Glucose-Capillary: 146 mg/dL — ABNORMAL HIGH (ref 70–99)
Glucose-Capillary: 174 mg/dL — ABNORMAL HIGH (ref 70–99)
Glucose-Capillary: 177 mg/dL — ABNORMAL HIGH (ref 70–99)
Glucose-Capillary: 96 mg/dL (ref 70–99)

## 2019-03-28 LAB — PHOSPHORUS: Phosphorus: 4.8 mg/dL — ABNORMAL HIGH (ref 2.5–4.6)

## 2019-03-28 LAB — MAGNESIUM: Magnesium: 1.8 mg/dL (ref 1.7–2.4)

## 2019-03-28 MED ORDER — LACTATED RINGERS IV BOLUS
1000.0000 mL | Freq: Once | INTRAVENOUS | Status: AC
  Administered 2019-03-28: 1000 mL via INTRAVENOUS

## 2019-03-28 MED ORDER — MAGNESIUM SULFATE IN D5W 1-5 GM/100ML-% IV SOLN
1.0000 g | Freq: Once | INTRAVENOUS | Status: AC
  Administered 2019-03-28: 1 g via INTRAVENOUS
  Filled 2019-03-28: qty 100

## 2019-03-28 MED ORDER — HYDRALAZINE HCL 50 MG PO TABS
25.0000 mg | ORAL_TABLET | Freq: Four times a day (QID) | ORAL | Status: DC | PRN
  Administered 2019-03-28 (×2): 25 mg via ORAL
  Filled 2019-03-28 (×3): qty 1

## 2019-03-28 NOTE — Progress Notes (Signed)
Pharmacy Antibiotic Note  Angela David is a 79 y.o. female admitted on 03/20/2019 with generalized weakness, hypotension secondary to hypovolemia vs sepsis, acute hypoxic respiratory failure. 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 Pt is now weaned off mechanical ventilator. Respiratory cultures (tracheal aspirate) from 01/22 indicate growth of MRSA. One out of two BCx showed MRSA growth. However due to increased thick mucus secretions, MD recommended to keep the antibiotic therapy going. There was an increase Scr today. Trough levels today 1/29  were 13. Dose was not increased since WBCs are WNL and afebrile. Pharmacy has been consulted for vancomycin dosing.  Plan: Will continue Vancomycin IV 1000mg  Q24H (maintenance dose)  Scr 0.80 AUC Goal 400-550 Calculated AUC 445  Total antibiotic duration: 7 days Days of antibiotic therapy so far: 5 Scr 0.94    Will monitor pt clinical presentation, labs and cultures to assess need for vancomycin. Will monitor serum creatinine tomorrow.   Height: 5\' 3"  (160 cm) Weight: 149 lb 14.6 oz (68 kg) IBW/kg (Calculated) : 52.4  Temp (24hrs), Avg:97.9 F (36.6 C), Min:97.6 F (36.4 C), Max:98.4 F (36.9 C)  Recent Labs  Lab 03/22/19 0445 03/22/19 0445 03/23/19 0422 03/23/19 0422 03/24/19 0537 03/24/19 0537 03/25/19 0443 03/26/19 2144 03/27/19 0542 03/27/19 1102 03/28/19 0602  WBC 9.0  --  11.2*  --  9.8  --   --   --   --  10.6* 12.7*  CREATININE 0.89   < > 0.79   < > 0.65   < > 0.73 0.82 1.17* 1.11* 0.94  VANCOTROUGH  --   --   --   --   --   --   --   --   --  13*  --    < > = values in this interval not displayed.    Estimated Creatinine Clearance: 47.1 mL/min (by C-G formula based on SCr of 0.94 mg/dL).    Allergies   Allergen Reactions  . Shellfish Allergy Shortness Of Breath    respitatory distress   . Sodium Sulfite Shortness Of Breath    Severe Congestion that creates inability to breath  . Codeine Other (See Comments)    Per patient "it kept her up all night"    Antimicrobials this admission: Cefepime 2g IV 01/23 >> 01/25 Vancomycin 1g IV 01/23 >> 01/23 Vancomycin 1500mg  IV (loading dose) 01/25 >> 01/25 Vancomycin  01/26 >>    Microbiology results: 01/22 BCx: No growth 5 days 01/22 BCx: No growth 5 days 01/22 Respiratory Culture (Tracheal Aspirate): Rare MRSA (finalized 01/25) 01/23 Respiratory Culture (Tracheal Aspirate): Rare consistent with normal respiratory flora (finalized 01/25) 01/22 SARS Coronavirus by PCR: Negative 01/22 Influenza A/B by PCR: Negative  Thank you for allowing pharmacy to be a part of this patient's care.  Chinita Greenland PharmD Clinical Pharmacist 03/28/2019

## 2019-03-28 NOTE — Consult Note (Signed)
PHARMACY CONSULT NOTE - FOLLOW UP  Pharmacy Consult for Electrolyte Monitoring and Replacement   Angela David is a 79 y.o. female admitted on 03/20/2019 with generalized weakness, hypotension secondary to hypovolemia vs sepsis, acute hypoxic respiratory failure. 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 Pt is off ventilator and on a thin liquid diet. Feeding supplements BID between meals started today. Pt is/may be having difficulty swallowing. Pharmacy consulted to assist with monitoring and replacing electrolytes.   Recent Labs: Potassium (mmol/L)  Date Value  03/28/2019 3.8  12/17/2011 4.1   Magnesium (mg/dL)  Date Value  03/28/2019 1.8  12/17/2011 2.2   Calcium (mg/dL)  Date Value  03/28/2019 9.7   Calcium, Total (mg/dL)  Date Value  12/17/2011 8.8   Albumin (g/dL)  Date Value  03/26/2019 2.8 (L)  08/28/2016 4.3  12/16/2011 4.1   Phosphorus (mg/dL)  Date Value  03/28/2019 4.8 (H)  12/16/2011 2.2 (L)   Sodium (mmol/L)  Date Value  03/28/2019 141  08/28/2016 143  12/17/2011 139     Assessment: 1. Electrolytes: K 3.8  Mag 1.8  Phos 4.8  Scr 0.94 Will order Magnesium sulfate 1 gram IV x 1.  Will monitor electrolytes in am labs. Will replace electrolytes to achieve target for potassium ~4, sodium ~140, and magnesium ~2.  2. Glucose: Today's range: 83-150.  Glucose-capillary monitored Q4H. Pt is on insulin aspart 0-9 units [place] Q4H. Patient on metformin outpt. Continue to monitor glucose levels daily and administration insulins.   Chinita Greenland PharmD Clinical Pharmacist 03/28/2019

## 2019-03-28 NOTE — Progress Notes (Signed)
Continue to follow this patient for spiritual support. I have met her husband who visits often. She was moved to a unit but transferred back to ICU the next day. Husband was present on last visit, patient was asleep but woke momentarily acknowledging my presence. Continued conversation with husband. Provided prayer and support to patient and husband. Will continue to follow up.

## 2019-03-28 NOTE — Progress Notes (Signed)
CRITICAL CARE NOTE SIGNIFICANT EVENTS/STUDIES:  01/22: Pt with chronic tracheostomy presented to ER with generalized weakness found to be hypercapnic requiring mechanical ventilation  01/22: CT Head revealed no CT evidence for acute intracranial abnormality. Atrophy and presumed chronic small vessel ischemic change of the white matter 01/22: CTA Chest revealed no pulmonary embolus. Dilated fluid-filled esophagus from the thoracic inlet to the gastroesophageal junction, may be reflux or dysmotility. Achalasia also considered. Mild dependent opacities in both lower lobes, likely atelectasis. Given esophageal dilatation, aspiration is also considered. Subacute right rib fractures. Some interval incomplete fracture healing since October 2020 injury. Stable right upper lobe scarring and benign pulmonary nodules. Aortic Atherosclerosis (ICD10-I70.0). 01/23: Pt admitted to ICU  03/22/19- Patient is clincally improved, of mechanical ventilation.  Starting PT/OT with plan to transition to hospitalist service. 1/28 returned back to ICU 1/29 off vent, eating by mouth, speech evaluated 1/30 remains off vent, eating by mouth   CC  Follow up resp failure  HPI Off vent Alert and awake Eating Plan for home vent     BP (!) 184/90   Pulse 76   Temp (!) 97.5 F (36.4 C) (Axillary)   Resp (!) 22   Ht 5\' 3"  (1.6 m)   Wt 68 kg   SpO2 98%   BMI 26.56 kg/m    I/O last 3 completed shifts: In: 1329.9 [P.O.:480; I.V.:849.9] Out: 1800 [Urine:1800] Total I/O In: 3006.7 [I.V.:2806.7; IV Piggyback:200] Out: 400 [Urine:400]  SpO2: 98 % O2 Flow Rate (L/min): 5 L/min FiO2 (%): 28 %   Review of Systems:  Gen:  Denies  fever, sweats, chills weight loss  HEENT: Denies blurred vision, double vision, ear pain, eye pain, hearing loss, nose bleeds, sore throat Cardiac:  No dizziness, chest pain or heaviness, chest tightness,edema, No JVD Resp:   No cough, -sputum production, -shortness of  breath,-wheezing, -hemoptysis,  Other:  All other systems negative    Physical Examination:   GENERAL:NAD, no fevers, chills, no weakness no fatigue HEAD: Normocephalic, atraumatic.  EYES: PERLA, EOMI No scleral icterus.  MOUTH: Moist mucosal membrane.  EAR, NOSE, THROAT: Clear without exudates. No external lesions S/p trach.  NECK: Supple. No thyromegaly.  No JVD.  PULMONARY: CTA B/L no wheezing, rhonchi, crackles CARDIOVASCULAR: S1 and S2. Regular rate and rhythm. No murmurs GASTROINTESTINAL: Soft, nontender, nondistended. Positive bowel sounds.  MUSCULOSKELETAL: No swelling, clubbing, or edema.  NEUROLOGIC: No gross focal neurological deficits. 5/5 strength all extremities SKIN: No ulceration, lesions, rashes, or cyanosis.  PSYCHIATRIC: Insight, judgment intact. -depression -anxiety ALL OTHER ROS ARE NEGATIVE          MEDICATIONS: I have reviewed all medications and confirmed regimen as documented   CULTURE RESULTS   Recent Results (from the past 240 hour(s))  Blood Culture (routine x 2)     Status: None   Collection Time: 03/20/19  7:39 PM   Specimen: BLOOD  Result Value Ref Range Status   Specimen Description BLOOD BLOOD LEFT FOREARM  Final   Special Requests   Final    BOTTLES DRAWN AEROBIC AND ANAEROBIC Blood Culture adequate volume   Culture   Final    NO GROWTH 5 DAYS Performed at [facility], [address] [place], [place] [address]    Report Status 03/25/2019 FINAL  Final  Blood Culture (routine x 2)     Status: None   Collection Time: 03/20/19  7:39 PM   Specimen: BLOOD  Result Value Ref Range Status   Specimen Description BLOOD  LEFT ANTECUBITAL  Final   Special Requests   Final    BOTTLES DRAWN AEROBIC AND ANAEROBIC Blood Culture adequate volume   Culture   Final    NO GROWTH 5 DAYS Performed at [facility], [address] [place], [place] [address]    Report Status 03/25/2019 FINAL  Final  Respiratory Panel by RT PCR  (Flu A&B, Covid) - Nasopharyngeal Swab     Status: None   Collection Time: 03/20/19  7:39 PM   Specimen: Nasopharyngeal Swab  Result Value Ref Range Status   SARS Coronavirus 2 by RT PCR NEGATIVE NEGATIVE Final    Comment: (NOTE) SARS-CoV-2 target nucleic acids are NOT DETECTED. The SARS-CoV-2 RNA is generally detectable in upper respiratoy specimens during the acute phase of infection. The lowest concentration of SARS-CoV-2 viral copies this assay can detect is 131 copies/mL. A negative result does not preclude SARS-Cov-2 infection and should not be used as the sole basis for treatment or other patient management decisions. A negative result may occur with  improper specimen collection/handling, submission of specimen other than nasopharyngeal swab, presence of viral mutation(s) within the areas targeted by this assay, and inadequate number of viral copies (<131 copies/mL). A negative result must be combined with clinical observations, patient history, and epidemiological information. The expected result is Negative. Fact Sheet for Patients:  PinkCheek.be Fact Sheet for Healthcare Providers:  GravelBags.it This test is not yet ap proved or cleared by the [place] FDA and  has been authorized for detection and/or diagnosis of SARS-CoV-2 by FDA under an Emergency Use Authorization (EUA). This EUA will remain  in effect (meaning this test can be used) for the duration of the COVID-19 declaration under Section 564(b)(1) of the Act, 21 U.S.C. section 360bbb-3(b)(1), unless the authorization is terminated or revoked sooner.    Influenza A by PCR NEGATIVE NEGATIVE Final   Influenza B by PCR NEGATIVE NEGATIVE Final    Comment: (NOTE) The Xpert Xpress SARS-CoV-2/FLU/RSV assay is intended as an aid in  the diagnosis of influenza from Nasopharyngeal swab specimens and  should not be used as a sole basis for treatment. Nasal  washings and  aspirates are unacceptable for Xpert Xpress SARS-CoV-2/FLU/RSV  testing. Fact Sheet for Patients: PinkCheek.be Fact Sheet for Healthcare Providers: GravelBags.it This test is not yet approved or cleared by the [place] FDA and  has been authorized for detection and/or diagnosis of SARS-CoV-2 by  FDA under an Emergency Use Authorization (EUA). This EUA will remain  in effect (meaning this test can be used) for the duration of the  Covid-19 declaration under Section 564(b)(1) of the Act, 21  U.S.C. section 360bbb-3(b)(1), unless the authorization is  terminated or revoked. Performed at [facility], [address] [place], [place] [address]   Culture, respiratory     Status: None   Collection Time: 03/20/19  9:45 PM   Specimen: Tracheal Aspirate  Result Value Ref Range Status   Specimen Description   Final    TRACHEAL ASPIRATE Performed at [facility], [address] [place], [place] [address]    Special Requests   Final    NONE Performed at [facility], [address] [place], [place] [address]    Gram Stain   Final    ABUNDANT WBC PRESENT, PREDOMINANTLY PMN RARE GRAM POSITIVE COCCI Performed at [facility], [place] [address], [place],  [address]    Culture RARE METHICILLIN RESISTANT STAPHYLOCOCCUS AUREUS  Final  Report Status 03/23/2019 FINAL  Final   Organism ID, Bacteria METHICILLIN RESISTANT STAPHYLOCOCCUS AUREUS  Final      Susceptibility   Methicillin resistant staphylococcus aureus - MIC*    CIPROFLOXACIN >=8 RESISTANT Resistant     ERYTHROMYCIN >=8 RESISTANT Resistant     GENTAMICIN <=0.5 SENSITIVE Sensitive     OXACILLIN >=4 RESISTANT Resistant     TETRACYCLINE <=1 SENSITIVE Sensitive     VANCOMYCIN 1 SENSITIVE Sensitive     TRIMETH/SULFA <=10 SENSITIVE Sensitive     CLINDAMYCIN RESISTANT Resistant     RIFAMPIN <=0.5 SENSITIVE Sensitive      Inducible Clindamycin POSITIVE Resistant     * RARE METHICILLIN RESISTANT STAPHYLOCOCCUS AUREUS  Culture, respiratory (tracheal aspirate)     Status: None   Collection Time: 03/21/19  4:43 AM   Specimen: Tracheal Aspirate; Respiratory  Result Value Ref Range Status   Specimen Description   Final    TRACHEAL ASPIRATE Performed at [facility], [address] [place], [place] [address]    Special Requests   Final    NONE Performed at [facility], [address] [place], [place] [address]    Gram Stain   Final    ABUNDANT WBC PRESENT, PREDOMINANTLY PMN NO ORGANISMS SEEN    Culture   Final    RARE Consistent with normal respiratory flora. Performed at [facility], [place] [address], [place],  [address]    Report Status 03/23/2019 FINAL  Final            ASSESSMENT AND PLAN SYNOPSIS  Severe ACUTE Hypoxic and Hypercapnic Respiratory Failure from MRSA pneumonia with h/o COPD and Hypercapnic resp failure with vocal cord dysfunction   Severe ACUTE Hypoxic and Hypercapnic Respiratory Failure -off vent Oxygen as needed Plan for home vent at night  INFECTIOUS DISEASE -continue antibiotics as prescribed -follow up cultures   GI GI PROPHYLAXIS as indicated  NUTRITIONAL STATUS DIET--> as tolerated Constipation protocol as indicated   ENDO - ICU hypoglycemic\Hyperglycemia protocol -check FSBS per protocol  ELECTROLYTES -follow labs as needed -replace as needed -pharmacy consultation and following     DVT/GI PRX ordered TRANSFUSIONS AS NEEDED MONITOR FSBS ASSESS the need for LABS as needed   Prognosis is gaurded    Corrin Parker, M.D.  [facility]  Medical Director [place] Director [place] Department

## 2019-03-28 NOTE — Progress Notes (Signed)
Patient was asleep when I visited today, husband who was standing bedside says she had been the same all day. He indicated today was not a good day compared to yesterday when she was alert and eating by mouth. We talked for awhile as nurse worked with medications and treatment for patient. Assured husband we will continue to follow up with visits.

## 2019-03-29 LAB — GLUCOSE, CAPILLARY
Glucose-Capillary: 84 mg/dL (ref 70–99)
Glucose-Capillary: 90 mg/dL (ref 70–99)
Glucose-Capillary: 96 mg/dL (ref 70–99)
Glucose-Capillary: 106 mg/dL — ABNORMAL HIGH (ref 70–99)
Glucose-Capillary: 142 mg/dL — ABNORMAL HIGH (ref 70–99)

## 2019-03-29 LAB — CBC WITH DIFFERENTIAL/PLATELET
Abs Immature Granulocytes: 0.05 10*3/uL (ref 0.00–0.07)
Basophils Absolute: 0 10*3/uL (ref 0.0–0.1)
Basophils Relative: 0 %
Eosinophils Absolute: 0.1 10*3/uL (ref 0.0–0.5)
Eosinophils Relative: 1 %
HCT: 33.7 % — ABNORMAL LOW (ref 36.0–46.0)
Hemoglobin: 10.6 g/dL — ABNORMAL LOW (ref 12.0–15.0)
Immature Granulocytes: 1 %
Lymphocytes Relative: 12 %
Lymphs Abs: 1.3 10*3/uL (ref 0.7–4.0)
MCH: 25.4 pg — ABNORMAL LOW (ref 26.0–34.0)
MCHC: 31.5 g/dL (ref 30.0–36.0)
MCV: 80.6 fL (ref 80.0–100.0)
Monocytes Absolute: 0.6 10*3/uL (ref 0.1–1.0)
Monocytes Relative: 6 %
Neutro Abs: 8.8 10*3/uL — ABNORMAL HIGH (ref 1.7–7.7)
Neutrophils Relative %: 80 %
Platelets: 228 10*3/uL (ref 150–400)
RBC: 4.18 MIL/uL (ref 3.87–5.11)
RDW: 13.7 % (ref 11.5–15.5)
WBC: 11 10*3/uL — ABNORMAL HIGH (ref 4.0–10.5)
nRBC: 0 % (ref 0.0–0.2)

## 2019-03-29 LAB — BASIC METABOLIC PANEL
Anion gap: 8 (ref 5–15)
BUN: 11 mg/dL (ref 8–23)
CO2: 31 mmol/L (ref 22–32)
Calcium: 9 mg/dL (ref 8.9–10.3)
Chloride: 98 mmol/L (ref 98–111)
Creatinine, Ser: 0.98 mg/dL (ref 0.44–1.00)
GFR calc Af Amer: >60 mL/min (ref >60)
GFR calc non Af Amer: 56 mL/min — ABNORMAL LOW (ref >60)
Glucose, Bld: 97 mg/dL (ref 70–99)
Potassium: 3.6 mmol/L (ref 3.5–5.1)
Sodium: 137 mmol/L (ref 135–145)

## 2019-03-29 LAB — PHOSPHORUS: Phosphorus: 2.3 mg/dL — ABNORMAL LOW (ref 2.5–4.6)

## 2019-03-29 LAB — MAGNESIUM: Magnesium: 1.7 mg/dL (ref 1.7–2.4)

## 2019-03-29 MED ORDER — BUSPIRONE HCL 15 MG PO TABS
15.0000 mg | ORAL_TABLET | Freq: Two times a day (BID) | ORAL | Status: DC
  Administered 2019-03-29 – 2019-04-01 (×6): 15 mg via ORAL
  Filled 2019-03-29 (×7): qty 1

## 2019-03-29 MED ORDER — GABAPENTIN 600 MG PO TABS: 600.0000 mg | ORAL_TABLET | Freq: Three times a day (TID) | ORAL | Status: DC

## 2019-03-29 MED ORDER — INSULIN ASPART 100 UNIT/ML ~~LOC~~ SOLN: 0.0000 [IU] | Freq: Every day | SUBCUTANEOUS | Status: DC

## 2019-03-29 MED ORDER — INSULIN ASPART 100 UNIT/ML ~~LOC~~ SOLN
0.0000 [IU] | Freq: Three times a day (TID) | SUBCUTANEOUS | Status: DC
  Administered 2019-03-30 – 2019-04-01 (×3): 1 [IU] via SUBCUTANEOUS
  Filled 2019-03-29 (×3): qty 1

## 2019-03-29 MED ORDER — POTASSIUM PHOSPHATES 15 MMOLE/5ML IV SOLN
15.0000 mmol | Freq: Once | INTRAVENOUS | Status: AC
  Administered 2019-03-29: 15 mmol via INTRAVENOUS
  Filled 2019-03-29: qty 5

## 2019-03-29 MED ORDER — BUSPIRONE HCL 15 MG PO TABS: 30.0000 mg | ORAL_TABLET | Freq: Two times a day (BID) | ORAL | Status: DC

## 2019-03-29 MED ORDER — GABAPENTIN 100 MG PO CAPS
400.0000 mg | ORAL_CAPSULE | Freq: Three times a day (TID) | ORAL | Status: DC
  Administered 2019-03-29 – 2019-04-01 (×8): 400 mg via ORAL
  Filled 2019-03-29 (×10): qty 4

## 2019-03-29 MED ORDER — MAGNESIUM SULFATE 2 GM/50ML IV SOLN
2.0000 g | Freq: Once | INTRAVENOUS | Status: AC
  Administered 2019-03-29: 07:00:00 2 g via INTRAVENOUS
  Filled 2019-03-29: qty 50

## 2019-03-29 NOTE — Consult Note (Signed)
PHARMACY CONSULT NOTE - FOLLOW UP  Pharmacy Consult for Electrolyte Monitoring and Replacement   Angela David is a 79 y.o. female admitted on 03/20/2019 with generalized weakness, hypotension secondary to hypovolemia vs sepsis, acute hypoxic respiratory failure. 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 Pt is off ventilator and on a thin liquid diet. Feeding supplements BID between meals started today. Pt is/may be having difficulty swallowing. Pharmacy consulted to assist with monitoring and replacing electrolytes.   Recent Labs: Potassium (mmol/L)  Date Value  03/29/2019 3.6  12/17/2011 4.1   Magnesium (mg/dL)  Date Value  03/29/2019 1.7  12/17/2011 2.2   Calcium (mg/dL)  Date Value  03/29/2019 9.0   Calcium, Total (mg/dL)  Date Value  12/17/2011 8.8   Albumin (g/dL)  Date Value  03/26/2019 2.8 (L)  08/28/2016 4.3  12/16/2011 4.1   Phosphorus (mg/dL)  Date Value  03/29/2019 2.3 (L)  12/16/2011 2.2 (L)   Sodium (mmol/L)  Date Value  03/29/2019 137  08/28/2016 143  12/17/2011 139     Assessment: 1. Electrolytes: K 3.6  Mag 1.7  Phos 2.3  Scr 0.98 NP Ordered Magnesium sulfate 2 gram IV x 1. Will order Potassium Phosphate 15 mmol IV x 1.   Will monitor electrolytes in am labs. Will replace electrolytes to achieve target for potassium ~4, sodium ~140, and magnesium ~2.  2. Glucose: Today's range: 84-174  Glucose-capillary monitored Q4H. Pt is on insulin aspart 0-9 units [place] Q4H. Patient on metformin outpt. Continue to monitor glucose levels daily and administration insulins.   Chinita Greenland PharmD Clinical Pharmacist 03/29/2019

## 2019-03-29 NOTE — Progress Notes (Signed)
Pt. Wants to stay on vent for the rest of the day and start trach collar tomorrow.

## 2019-03-29 NOTE — Progress Notes (Signed)
Pharmacy Antibiotic Note  Angela David is a 79 y.o. female admitted on 03/20/2019 with generalized weakness, hypotension secondary to hypovolemia vs sepsis, acute hypoxic respiratory failure. 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 Pt is now weaned off mechanical ventilator. Respiratory cultures (tracheal aspirate) from 01/22 indicate growth of MRSA. One out of two BCx showed MRSA growth. However due to increased thick mucus secretions, MD recommended to keep the antibiotic therapy going. There was an increase Scr today. Trough levels today 1/29  were 13. Dose was not increased since WBCs are WNL and afebrile. Pharmacy has been consulted for vancomycin dosing.  Plan: Will continue Vancomycin IV 1000mg  Q24H (maintenance dose)  Scr 0.80 AUC Goal 400-550 Calculated AUC 445  Total antibiotic duration: 7 days Days of antibiotic therapy so far: 7 after dose today 1/31 Scr 0.94    Will monitor pt clinical presentation, labs and cultures to assess need for vancomycin. Will monitor serum creatinine tomorrow.   Height: 5\' 3"  (160 cm) Weight: 159 lb 2.8 oz (72.2 kg) IBW/kg (Calculated) : 52.4  Temp (24hrs), Avg:97.6 F (36.4 C), Min:97.1 F (36.2 C), Max:97.8 F (36.6 C)  Recent Labs  Lab 03/23/19 0422 03/23/19 0422 03/24/19 0537 03/25/19 0443 03/26/19 2144 03/27/19 0542 03/27/19 1102 03/28/19 0602 03/29/19 0450  WBC 11.2*  --  9.8  --   --   --  10.6* 12.7* 11.0*  CREATININE 0.79   < > 0.65   < > 0.82 1.17* 1.11* 0.94 0.98  VANCOTROUGH  --   --   --   --   --   --  13*  --   --    < > = values in this interval not displayed.    Estimated Creatinine Clearance: 46.5 mL/min (by C-G formula based on SCr of 0.98 mg/dL).    Allergies  Allergen Reactions  . Shellfish Allergy  Shortness Of Breath    respitatory distress   . Sodium Sulfite Shortness Of Breath    Severe Congestion that creates inability to breath  . Codeine Other (See Comments)    Per patient "it kept her up all night"    Antimicrobials this admission: Cefepime 2g IV 01/23 >> 01/25 Vancomycin 1g IV 01/23 >> 01/23 Vancomycin 1500mg  IV (loading dose) 01/25 >> 01/25 Vancomycin  01/26 >>    Microbiology results: 01/22 BCx: No growth 5 days 01/22 BCx: No growth 5 days 01/22 Respiratory Culture (Tracheal Aspirate): Rare MRSA (finalized 01/25) 01/23 Respiratory Culture (Tracheal Aspirate): Rare consistent with normal respiratory flora (finalized 01/25) 01/22 SARS Coronavirus by PCR: Negative 01/22 Influenza A/B by PCR: Negative  Thank you for allowing pharmacy to be a part of this patient's care.  Chinita Greenland PharmD Clinical Pharmacist 03/29/2019

## 2019-03-30 DIAGNOSIS — R531 Weakness: Secondary | ICD-10-CM

## 2019-03-30 LAB — BASIC METABOLIC PANEL
Anion gap: 9 (ref 5–15)
BUN: 18 mg/dL (ref 8–23)
CO2: 30 mmol/L (ref 22–32)
Calcium: 9.2 mg/dL (ref 8.9–10.3)
Chloride: 99 mmol/L (ref 98–111)
Creatinine, Ser: 1.29 mg/dL — ABNORMAL HIGH (ref 0.44–1.00)
GFR calc Af Amer: 47 mL/min — ABNORMAL LOW (ref >60)
GFR calc non Af Amer: 40 mL/min — ABNORMAL LOW (ref >60)
Glucose, Bld: 124 mg/dL — ABNORMAL HIGH (ref 70–99)
Potassium: 3.6 mmol/L (ref 3.5–5.1)
Sodium: 138 mmol/L (ref 135–145)

## 2019-03-30 LAB — CBC WITH DIFFERENTIAL/PLATELET
Abs Immature Granulocytes: 0.03 10*3/uL (ref 0.00–0.07)
Basophils Absolute: 0 10*3/uL (ref 0.0–0.1)
Basophils Relative: 0 %
Eosinophils Absolute: 0.2 10*3/uL (ref 0.0–0.5)
Eosinophils Relative: 2 %
HCT: 30.3 % — ABNORMAL LOW (ref 36.0–46.0)
Hemoglobin: 9.6 g/dL — ABNORMAL LOW (ref 12.0–15.0)
Immature Granulocytes: 0 %
Lymphocytes Relative: 19 %
Lymphs Abs: 1.5 10*3/uL (ref 0.7–4.0)
MCH: 25.5 pg — ABNORMAL LOW (ref 26.0–34.0)
MCHC: 31.7 g/dL (ref 30.0–36.0)
MCV: 80.4 fL (ref 80.0–100.0)
Monocytes Absolute: 0.6 10*3/uL (ref 0.1–1.0)
Monocytes Relative: 7 %
Neutro Abs: 5.9 10*3/uL (ref 1.7–7.7)
Neutrophils Relative %: 72 %
Platelets: 187 10*3/uL (ref 150–400)
RBC: 3.77 MIL/uL — ABNORMAL LOW (ref 3.87–5.11)
RDW: 14.4 % (ref 11.5–15.5)
WBC: 8.3 10*3/uL (ref 4.0–10.5)
nRBC: 0 % (ref 0.0–0.2)

## 2019-03-30 LAB — GLUCOSE, CAPILLARY
Glucose-Capillary: 163 mg/dL — ABNORMAL HIGH (ref 70–99)
Glucose-Capillary: 112 mg/dL — ABNORMAL HIGH (ref 70–99)
Glucose-Capillary: 185 mg/dL — ABNORMAL HIGH (ref 70–99)
Glucose-Capillary: 126 mg/dL — ABNORMAL HIGH (ref 70–99)
Glucose-Capillary: 138 mg/dL — ABNORMAL HIGH (ref 70–99)

## 2019-03-30 LAB — PHOSPHORUS: Phosphorus: 2.8 mg/dL (ref 2.5–4.6)

## 2019-03-30 LAB — MAGNESIUM: Magnesium: 1.9 mg/dL (ref 1.7–2.4)

## 2019-03-30 MED ORDER — PANTOPRAZOLE SODIUM 40 MG PO TBEC
40.0000 mg | DELAYED_RELEASE_TABLET | Freq: Every day | ORAL | Status: DC
  Administered 2019-03-30 – 2019-03-31 (×2): 40 mg via ORAL
  Filled 2019-03-30 (×3): qty 1

## 2019-03-30 MED ORDER — ACETAMINOPHEN 325 MG PO TABS
650.0000 mg | ORAL_TABLET | Freq: Four times a day (QID) | ORAL | Status: DC | PRN
  Administered 2019-03-30 – 2019-04-01 (×4): 650 mg via ORAL
  Filled 2019-03-30 (×4): qty 2

## 2019-03-30 NOTE — Consult Note (Signed)
PHARMACY CONSULT NOTE - FOLLOW UP  Pharmacy Consult for Electrolyte Monitoring and Replacement   Angela David is a 79 y.o. female admitted on 03/20/2019 with generalized weakness, hypotension secondary to hypovolemia vs sepsis, acute hypoxic respiratory failure. 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 Pt is off ventilator and on a thin dysphagia diet. Feeding supplements BID between meals started 1/29. Pt is/may be having difficulty swallowing. Pharmacy consulted to assist with monitoring and replacing electrolytes.   Recent Labs: Potassium (mmol/L)  Date Value  03/30/2019 3.6  12/17/2011 4.1   Magnesium (mg/dL)  Date Value  03/30/2019 1.9  12/17/2011 2.2   Calcium (mg/dL)  Date Value  03/30/2019 9.2   Calcium, Total (mg/dL)  Date Value  12/17/2011 8.8   Albumin (g/dL)  Date Value  03/26/2019 2.8 (L)  08/28/2016 4.3  12/16/2011 4.1   Phosphorus (mg/dL)  Date Value  03/30/2019 2.8  12/16/2011 2.2 (L)   Sodium (mmol/L)  Date Value  03/30/2019 138  08/28/2016 143  12/17/2011 139    Assessment: 1. Electrolytes: (goal electrolytes WNL) No electrolyte supplementation required today She continues to receive lactated ringers MIVF at 100 mL/hr  2. Glucose: Last 24h range: 90 - 142 (1 unit SSI required)  Glucose-capillary monitored ACHS. Pt is on insulin aspart 0-9 units SQ ACHS. Patient on metformin outpt. Continue to monitor glucose levels daily and administration insulins.   Vallery Sa, PharmD Clinical Pharmacist 03/30/2019

## 2019-03-30 NOTE — Progress Notes (Signed)
[place] visited pt as follow-up from previous visit attempts and other chaplains' referrals.  Pt. in bed eating dinner; pt. has trach tube --> difficulty talking, but still able to communicate by moving lips.  Pt. shared her family was here earlier today; she will go home soon to stay w/family w/home ventilator.  Pt. requested prayer, which [place] provided.  After prayer, pt. said, 'I'm just so afraid of dying... I'm 78 --I know I don't have a lot of time left, but I want more time with my family.'  In extended visit, [place] provided an additional prayer for divine presence and peace and active listening as pt. described her health condition and her faith, which has been a major source of strength for her.  [place] mentioned availability of on-call chaplain and pt. requested on-call ch. come visit if possible; also requested [place] come back tomorrow if possible.  [place] told pt. he would mention request to on-call [facility] and that a chaplain would try to come back tomorrow.  Pt. 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  [place] relayed message re: pain to RN.  Chaplains remain available as needed.       03/30/19 1700  Clinical Encounter Type  Visited With Patient;Health care provider  Visit Type Follow-up;Spiritual support;Psychological support;Social support;Critical Care  Referral From Chaplain  Consult/Referral To Chaplain  Recommendations  (Pt. requests follow-up visit for prayer)  Spiritual Encounters  Spiritual Needs Prayer;Emotional  Stress Factors  Patient Stress Factors Family relationships;Exhausted;Health changes;Loss of control;Major life changes (pain; anxiety)

## 2019-03-30 NOTE — Progress Notes (Signed)
CRITICAL CARE NOTE 79 year old with chronic tracheostomy due to tracheal stenosis now with hypercapnic respiratory failure requiring mechanical ventilation.  SIGNIFICANT EVENTS/STUDIES:  01/22: Pt with chronic tracheostomy presented to ER with generalized weakness found to be hypercapnic requiring mechanical ventilation  01/22: CT Head revealed no CT evidence for acute intracranial abnormality. Atrophy and presumed chronic small vessel ischemic change of the white matter 01/22: CTA Chest revealed no pulmonary embolus. Dilated fluid-filled esophagus from the thoracic inlet to the gastroesophageal junction, may be reflux or dysmotility. Achalasia also considered. Mild dependent opacities in both lower lobes, likely atelectasis. Given esophageal dilatation, aspiration is also considered. Subacute right rib fractures. Some interval incomplete fracture healing since October 2020 injury. Stable right upper lobe scarring and benign pulmonary nodules. Aortic Atherosclerosis (ICD10-I70.0). 01/23: Pt admitted to ICU  03/22/19- Patient is clincally improved, of mechanical ventilation.  Starting PT/OT with plan to transition to hospitalist service. 1/28 returned back to ICU 1/29 off vent, eating by mouth, speech evaluated 1/30 remains off vent, eating by mouth 2/01 husband and niece to be trained  on Trilogy vent   CC  Follow up resp failure  HPI Off vent during the day On vent at night Alert and awake Eating Plan for home vent     BP 135/79   Pulse 88   Temp 98.4 F (36.9 C) (Oral)   Resp 18   Ht 5\' 3"  (1.6 m)   Wt 72.2 kg   SpO2 99%   BMI 28.20 kg/m    I/O last 3 completed shifts: In: 0  Out: 1150 [Urine:1150] No intake/output data recorded.  SpO2: 99 % O2 Flow Rate (L/min): 5 L/min FiO2 (%): 28 %   Review of Systems:  Gen:  Denies  fever, sweats, chills  HEENT: Denies blurred vision, double vision, ear pain, eye pain, hearing loss, nose bleeds, sore throat Cardiac:  No  dizziness, chest pain or heaviness, chest tightness,edema Resp:   No cough, -sputum production, -shortness of breath,-wheezing, -hemoptysis,  Other:  All other systems negative    Physical Examination:   GENERAL:NAD, no fevers, chills, no weakness no fatigue, interactive by writing notes HEAD: Normocephalic, atraumatic.  EYES: PERLA, EOMI No scleral icterus.  MOUTH: Oral mucosa moist, no thrush EAR, NOSE, THROAT: Clear without exudates. No external lesions NECK: Supple. No thyromegaly.  No JVD.  Tracheostomy in place  PULMONARY: CTA B/L, no adventitious sounds CARDIOVASCULAR: S1 and S2. Regular rate and rhythm. No murmurs GASTROINTESTINAL: Soft, nontender, nondistended. Positive bowel sounds.  MUSCULOSKELETAL: No swelling, clubbing, or edema.  NEUROLOGIC: No gross focal neurological deficits. 5/5 strength all extremities SKIN: No ulceration, lesions, rashes, or cyanosis.  PSYCHIATRIC: Insight, judgment intact. -depression -anxiety    MEDICATIONS: I have reviewed all medications and confirmed regimen as documented   CULTURE RESULTS   Recent Results (from the past 240 hour(s))  Culture, respiratory (tracheal aspirate)     Status: None   Collection Time: 03/21/19  4:43 AM   Specimen: Tracheal Aspirate; Respiratory  Result Value Ref Range Status   Specimen Description   Final    TRACHEAL ASPIRATE Performed at [facility], [address] [place], [place] [address]    Special Requests   Final    NONE Performed at [facility], [address] [place], [place] [address]    Gram Stain   Final    ABUNDANT WBC PRESENT, PREDOMINANTLY PMN NO ORGANISMS SEEN    Culture   Final    RARE Consistent with normal respiratory  flora. 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   Severe ACUTE Hypoxic and Hypercapnic Respiratory Failure off vent during the day, nocturnal home vent Oxygen as needed   INFECTIOUS DISEASE Completed antibiotics for MRSA pneumonia  GI GI PROPHYLAXIS as indicated  NUTRITIONAL STATUS DIET--> as tolerated Constipation protocol as indicated   ENDO - ICU hypoglycemic\Hyperglycemia protocol -check FSBS per protocol  ELECTROLYTES -follow labs as needed -replace as needed -pharmacy consultation and following   DVT/GI PRX ordered TRANSFUSIONS AS NEEDED MONITOR FSBS ASSESS the need for LABS as needed   Will need to be on Trilogy (home vent) ventilator overnight monitored in stepdown.  If she is able to tolerate may discharge home in a.m.  Patient's husband and needs to be taught about the trilogy vent.  Discussed during multidisciplinary rounds.  Renold Don, MD [facility]

## 2019-03-30 NOTE — Evaluation (Signed)
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  She presented to [facility] on 01/22 via EMS, admitted with generalized weakness, hypotension secondary to hypovolemia vs. sepsis, acute on chronic hypercapnic hypoxic respiratory failure likely secondary to mucous plugging requiring mechanical ventilation via chronic tracheostomy. work up showed worsening hypercapnia and L bibasilar atelectasis, as well as subacute R rib fractures, admitted to ICU and ventilated from 1/22/-1/23. rapid response called 1/28 for loss of consciousness, respiratory acidosis and CO2 retention, returned to CCU, intubated. Extubated 1/29.    Clinical Impression  Pt alert, family at bedside during re-evaluation. PLOF carried forward. The patient reported that she was doing well, no pain at start of session. The patient was able to perform several supine LE exercises with verbal cues, spO2 >90% throughout. Supine to sit with modA, and able to sit EOB for several minutes. MaxA to assist repositioning to encourage midline, fair balance with BUE supported. Sit <> Stand attempted with RW and modA, pt relied heavily on bed for posterior stabilization. May have experienced desaturation, unclear reading, but pt did endorse SOB and some dizziness. Pt and PT discussed attempting again with 2 person assist, pt deferred due to fatigue.  Overall the patient demonstrated deficits (see "PT  Problem List") that impede the patient's functional abilities, safety, and mobility and would benefit from skilled PT intervention. Recommendation is SNF, pt aware.     Follow Up Recommendations SNF    Equipment Recommendations  Other (comment)(extensive DME at home)    Recommendations for Other Services OT consult     Precautions / Restrictions Precautions Precautions: Fall Restrictions Weight Bearing Restrictions: No      Mobility  Bed Mobility Overal bed mobility: Needs Assistance Bed Mobility: Supine to Sit;Sit to Supine     Supine to sit: HOB elevated;Min assist Sit to supine: Mod assist;HOB elevated      Transfers Overall transfer level: Needs assistance Equipment used: Rolling walker (2 wheeled) Transfers: Sit to/from Stand Sit to Stand: Mod assist Stand pivot transfers: From elevated surface       General transfer comment: walker attempted on sit to stand, pt with significant posterior lean, cannot tolerate static stand long and does not reach back to control descent back to EOB.  Ambulation/Gait             General Gait Details: deferred  Stairs            Wheelchair Mobility    Modified Rankin (Stroke Patients Only)       Balance Overall balance assessment: Needs assistance Sitting-balance support: Bilateral upper extremity supported Sitting balance-Leahy Scale: Fair   Postural control: Posterior lean Standing balance support: Bilateral upper extremity supported Standing balance-Leahy Scale: Poor Standing balance comment: modA to maintain standing as well as stabilization of legs against bed                             Pertinent Vitals/Pain Pain Assessment: No/denies pain    Home Living Family/patient expects to be discharged  to:: Private residence Living Arrangements: Spouse/significant other Available Help at Discharge: Family;Available 24 hours/day Type of Home: House Home Access: Ramped entrance     Home  Layout: One level Home Equipment: Walker - 2 wheels;Wheelchair - manual;Cane - single point;Grab bars - toilet;Other (comment)      Prior Function Level of Independence: Independent with assistive device(s)         Comments: intermittently used her cane     Hand Dominance   Dominant Hand: Right    Extremity/Trunk Assessment   Upper Extremity Assessment Upper Extremity Assessment: Generalized weakness    Lower Extremity Assessment Lower Extremity Assessment: Generalized weakness       Communication   Communication: Tracheostomy  Cognition Arousal/Alertness: Awake/alert Behavior During Therapy: WFL for tasks assessed/performed Overall Cognitive Status: Within Functional Limits for tasks assessed                                 General Comments: Pt with paralyzed vocal cords and chronic trach, answers appropraitely-mouths answers, A&O      General Comments      Exercises General Exercises - Lower Extremity Ankle Circles/Pumps: AROM;Strengthening;Both;10 reps Quad Sets: AROM;Strengthening;Both;10 reps Long Arc Quad: AROM;Strengthening;Both;10 reps Heel Slides: AROM;Strengthening;Both;10 reps   Assessment/Plan    PT Assessment Patient needs continued PT services  PT Problem List Decreased strength;Decreased mobility;Decreased activity tolerance;Decreased balance       PT Treatment Interventions DME instruction;Therapeutic exercise;Gait training;Balance training;Neuromuscular re-education;Functional mobility training;Therapeutic activities;Patient/family education    PT Goals (Current goals can be found in the Care Plan section)  Acute Rehab PT Goals Patient Stated Goal: to go home PT Goal Formulation: With patient Time For Goal Achievement: 04/13/19 Potential to Achieve Goals: Good    Frequency Min 2X/week   Barriers to discharge        Co-evaluation               AM-PAC PT "6 Clicks" Mobility  Outcome Measure Help needed  turning from your back to your side while in a flat bed without using bedrails?: A Little Help needed moving from lying on your back to sitting on the side of a flat bed without using bedrails?: A Little Help needed moving to and from a bed to a chair (including a wheelchair)?: A Lot Help needed standing up from a chair using your arms (e.g., wheelchair or bedside chair)?: A Lot Help needed to walk in hospital room?: A Lot Help needed climbing 3-5 steps with a railing? : Total 6 Click Score: 13    End of Session Equipment Utilized During Treatment: Oxygen(trach collar 5L) Activity Tolerance: Patient tolerated treatment well Patient left: with call bell/phone within reach;in bed;with bed alarm set Nurse Communication: Mobility status PT Visit Diagnosis: Muscle weakness (generalized) (M62.81);Other abnormalities of gait and mobility (R26.89);Difficulty in walking, not elsewhere classified (R26.2)    Time: [id number] PT Time Calculation (min) (ACUTE ONLY): 36 min   Charges:   PT Evaluation $PT Re-evaluation: 1 Re-eval PT Treatments $Therapeutic Exercise: 23-37 mins       Lieutenant Diego PT, DPT 4:24 PM,03/30/19

## 2019-03-30 NOTE — Progress Notes (Signed)
Placed pt. On trach collar @ 28%,no resp. Distress noted at this time.Vent at bedside. Vital signs are stable ,sat 98.

## 2019-03-30 NOTE — Progress Notes (Signed)
Patient placed on home vent, however, apnea alarm keeps triggering. Vent settings set to 16bpm and Apnea Alarm is also set to 16bpm. Alarm is being triggered every 30 seconds. therefore patient is placed back on Hospital owned Servo vent for remainder of the night. Per Policy, home vent settings cannot be adjusted or changed without Physician or Home Health orders. Patient has vent orders PRN for PRVC 450Vte, 16bpm, 28% FIO2, 5+ Peep. Patient tolerating vent well. Husband is at bedside. All questions were answered. Husband communicates he understands why patient is on Servo and not home vent. He wishes for her to get some sleep tonight and will come back tomorrow.

## 2019-03-31 LAB — MAGNESIUM: Magnesium: 1.7 mg/dL (ref 1.7–2.4)

## 2019-03-31 LAB — RENAL FUNCTION PANEL
Albumin: 2.5 g/dL — ABNORMAL LOW (ref 3.5–5.0)
Anion gap: 6 (ref 5–15)
BUN: 21 mg/dL (ref 8–23)
CO2: 31 mmol/L (ref 22–32)
Calcium: 9.2 mg/dL (ref 8.9–10.3)
Chloride: 104 mmol/L (ref 98–111)
Creatinine, Ser: 1.2 mg/dL — ABNORMAL HIGH (ref 0.44–1.00)
GFR calc Af Amer: 51 mL/min — ABNORMAL LOW (ref >60)
GFR calc non Af Amer: 44 mL/min — ABNORMAL LOW (ref >60)
Glucose, Bld: 115 mg/dL — ABNORMAL HIGH (ref 70–99)
Phosphorus: 2.7 mg/dL (ref 2.5–4.6)
Potassium: 3.6 mmol/L (ref 3.5–5.1)
Sodium: 141 mmol/L (ref 135–145)

## 2019-03-31 LAB — GLUCOSE, CAPILLARY
Glucose-Capillary: 108 mg/dL — ABNORMAL HIGH (ref 70–99)
Glucose-Capillary: 162 mg/dL — ABNORMAL HIGH (ref 70–99)
Glucose-Capillary: 146 mg/dL — ABNORMAL HIGH (ref 70–99)
Glucose-Capillary: 136 mg/dL — ABNORMAL HIGH (ref 70–99)

## 2019-03-31 NOTE — Progress Notes (Signed)
Patient placed on 28% ATC. Tol well at this time, alert and oriented, and sats 100%.

## 2019-03-31 NOTE — Progress Notes (Signed)
CRITICAL CARE NOTE 79 year old with chronic tracheostomy due to tracheal stenosis now with hypercapnic respiratory failure requiring mechanical ventilation.  SIGNIFICANT EVENTS/STUDIES:  01/22: Pt with chronic tracheostomy presented to ER with generalized weakness found to be hypercapnic requiring mechanical ventilation  01/22: CT Head revealed no CT evidence for acute intracranial abnormality. Atrophy and presumed chronic small vessel ischemic change of the white matter 01/22: CTA Chest revealed no pulmonary embolus. Dilated fluid-filled esophagus from the thoracic inlet to the gastroesophageal junction, may be reflux or dysmotility. Achalasia also considered. Mild dependent opacities in both lower lobes, likely atelectasis. Given esophageal dilatation, aspiration is also considered. Subacute right rib fractures. Some interval incomplete fracture healing since October 2020 injury. Stable right upper lobe scarring and benign pulmonary nodules. Aortic Atherosclerosis (ICD10-I70.0). 01/23: Pt admitted to ICU  03/22/19- Patient is clincally improved, of mechanical ventilation.  Starting PT/OT with plan to transition to hospitalist service. 1/28 returned back to ICU 1/29 off vent, eating by mouth, speech evaluated 1/30 remains off vent, eating by mouth 2/01 husband and niece to be trained  on Trilogy vent 2/02 unable to use Trilogy vent last night due to alarms going off.  DME company needs to review settings   CC  Follow up resp failure  HPI Off vent during the day On vent at night Alert and awake Eating well Plan for home vent     BP (!) 141/53   Pulse 96   Temp 98.2 F (36.8 C) (Oral)   Resp (!) 24   Ht 5\' 3"  (1.6 m)   Wt 71.6 kg   SpO2 100%   BMI 27.96 kg/m    I/O last 3 completed shifts: In: -  Out: 1850 [Urine:1850] Total I/O In: 0  Out: 250 [Urine:250]  SpO2: 100 % O2 Flow Rate (L/min): 5 L/min FiO2 (%): 28 %   Review of Systems:  Gen:  Denies  fever, sweats,  chills  HEENT: Denies blurred vision, double vision, ear pain, eye pain, hearing loss, nose bleeds, sore throat Cardiac:  No dizziness, chest pain or heaviness, chest tightness,edema Resp:   No cough, -sputum production, -shortness of breath,-wheezing, -hemoptysis,  Other:  All other systems negative    Physical Examination:   GENERAL:NAD, no fevers, chills, no weakness no fatigue, interactive by writing notes HEAD: Normocephalic, atraumatic.  EYES: PERLA, EOMI No scleral icterus.  MOUTH: Oral mucosa moist, no thrush EAR, NOSE, THROAT: Clear without exudates. No external lesions NECK: Supple. No thyromegaly.  No JVD.  Tracheostomy in place  PULMONARY: CTA B/L, no adventitious sounds CARDIOVASCULAR: S1 and S2. Regular rate and rhythm. No murmurs GASTROINTESTINAL: Soft, nontender, nondistended. Positive bowel sounds.  MUSCULOSKELETAL: No swelling, clubbing, or edema.  NEUROLOGIC: No gross focal neurological deficits. 5/5 strength all extremities SKIN: No ulceration, lesions, rashes, or cyanosis.  PSYCHIATRIC: Insight, judgment intact.  Jovial today.   MEDICATIONS: I have reviewed all medications and confirmed regimen as documented   CULTURE RESULTS   No results found for this or any previous visit (from the past 240 hour(s)).      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   Severe ACUTE Hypoxic and Hypercapnic Respiratory Failure off vent during the day, nocturnal home vent trial again tonight Oxygen as needed to keep saturations at 90% or better   INFECTIOUS DISEASE Completed antibiotics for MRSA  pneumonia  GI GI PROPHYLAXIS as indicated  NUTRITIONAL STATUS DIET--> as tolerated Constipation protocol as indicated   ENDO - ICU  hypoglycemic\Hyperglycemia protocol -check FSBS per protocol  ELECTROLYTES -follow labs as needed -replace as needed -pharmacy consultation and following   DVT/GI PRX ordered TRANSFUSIONS AS NEEDED MONITOR FSBS ASSESS the need for LABS as needed   Will need to be on Trilogy (home vent) ventilator overnight monitored in stepdown.  There were issues last night with the ventilator that needs to be checked by the DME company.  If she is able to tolerate Trilogy ventilator tonight may discharge home in a.m. patient's family has been taught on the use of the Trilogy vent.  Discussed during multidisciplinary rounds.  Renold Don, MD [facility]   *This note was dictated using voice recognition software/Dragon.  Despite best efforts to proofread, errors can occur which can change the meaning.  Any change was purely unintentional.

## 2019-03-31 NOTE — Evaluation (Signed)
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  She presented to [facility] ER on 01/22 via EMS, admitted with generalized weakness, hypotension secondary to hypovolemia vs. sepsis, acute on chronic hypercapnic hypoxic respiratory failure likely secondary to mucous plugging requiring mechanical ventilation via chronic tracheostomy. work up showed worsening hypercapnia and L bibasilar atelectasis, as well as subacute R rib fractures, admitted to ICU and ventilated from 1/22/-1/23. rapid response called 1/28 for loss of consciousness, respiratory acidosis and CO2 retention, returned to CCU, intubated. Extubated 1/29.   Clinical Impression   Pt seen for re-evaluation this date following transfer to ICU d/t increased O2 needs. Pt's home setup and PLOF listed below. Pt currently presents on trach collar and is tolerating well with sats >94% majority of OT/PT session (slight drop in spO2 with activity, but with unclear pleth d/t pt grip on RW decreasing efficacy of monitor, in addition-pt tolerating resting on RA). Pt tolerates participation in toileting with MOD/MAX A at bed level using lateral rolling technique to participate in peri care, pt able to participate in UB ADLs while seated with MIN A to setup, requires MOD/MAX A for LB ADLs in sitting, and MIN A +2 for transfers with RW for line management/safety. Pt overall tolerates well. OT provides  education re: postural exercises to encourage improved surface area for breathing (see below for details). Overall, as pt is still requiring significant amount of assistance versus PLOF, anticipate that SNF is still most safe d/c disposition.     Follow Up Recommendations  SNF    Equipment Recommendations  3 in 1 bedside commode    Recommendations for Other Services       Precautions / Restrictions Precautions Precautions: Fall Restrictions Weight Bearing Restrictions: No      Mobility Bed Mobility Overal bed mobility: Needs Assistance Bed Mobility: Supine to Sit     Supine to sit: HOB elevated;Min guard;Min assist        Transfers Overall transfer level: Needs assistance Equipment used: Rolling walker (2 wheeled) Transfers: Sit to/from [facility] Sit to Stand: Min assist;+2 safety/equipment Stand pivot transfers: Min assist;+2 safety/equipment       General transfer comment: Pt requires rocking and 3 count methof to power up to stand. In standing, requires MIN cues to come to fully erect stand (somewhat flexed hips/knees/cervical spine in standing initially).    Balance Overall balance assessment: Needs assistance Sitting-balance support: Bilateral upper extremity supported Sitting balance-Leahy Scale: Fair   Postural control: Posterior lean Standing balance support: Bilateral upper extremity supported Standing balance-Leahy Scale: Fair Standing balance comment: Requires UE support on RW and at least MIN A in standing to maintain static standing balance. With 4-5 shuffling side steps towards pt's R towards chair, PT facilitates assistance with balance while OT facilitates assistance with manuevering RW.                           ADL either performed or assessed with clinical judgement   ADL Overall ADL's : Needs assistance/impaired  General ADL Comments: Pt requires MIN A to  setup for seated UB ADLs (more successful if in supported sitting versus EOB), Requires MOD/MAX A for thorough completion of peri care in side-lying after using bedpan for BM, Requires MAX A for LB ADLs using sit/lateral lean methof f/t decreased dynamic sitting balance, Pt requires MIN/MOD A for sit<>stand with RW, MIN +2 for equipment management for SPS transfers (bed<>chair<>commode).     Vision Baseline Vision/History: Wears glasses Wears Glasses: At all times Patient Visual Report: No change from baseline       Perception     Praxis      Pertinent Vitals/Pain Pain Assessment: No/denies pain     Hand Dominance Right   Extremity/Trunk Assessment Upper Extremity Assessment Upper Extremity Assessment: Generalized weakness   Lower Extremity Assessment Lower Extremity Assessment: Generalized weakness   Cervical / Trunk Assessment Cervical / Trunk Assessment: Normal   Communication Communication Communication: Tracheostomy   Cognition Arousal/Alertness: Awake/alert Behavior During Therapy: WFL for tasks assessed/performed Overall Cognitive Status: Within Functional Limits for tasks assessed                                 General Comments: some decreased insight into deficits, but pt alert and oriented x4, mouths or writes answers for communication and is appropriate with commands/cue following   General Comments       Exercises Other Exercises Other Exercises: OT facilitates education re: importance of posture for breathing as well as standing balance. In addition, OT engages pt in education re: postural exercises to fully extend posture to increase surface area for lung expansion. Pt demos understanding.   Shoulder Instructions      Home Living Family/patient expects to be discharged to:: Private residence Living Arrangements: Spouse/significant other Available Help at Discharge: Family;Available 24 hours/day Type of Home: House Home Access: Ramped  entrance     Home Layout: One level     Bathroom Shower/Tub: Occupational psychologist: Standard     Home Equipment: Environmental consultant - 2 wheels;Wheelchair - manual;Cane - single point;Grab bars - toilet;Other (comment)          Prior Functioning/Environment Level of Independence: Independent with assistive device(s)        Comments: intermittently used her cane        OT Problem List: Decreased strength;Decreased activity tolerance;Impaired balance (sitting and/or standing);Decreased knowledge of use of DME or AE      OT Treatment/Interventions: Self-care/ADL training;Therapeutic exercise;Energy conservation;DME and/or AE instruction;Therapeutic activities;Patient/family education;Balance training    OT Goals(Current goals can be found in the care plan section) Acute Rehab OT Goals Patient Stated Goal: to go home OT Goal Formulation: With patient Time For Goal Achievement: 04/14/19 Potential to Achieve Goals: Good  OT Frequency: Min 1X/week   Barriers to D/C:            Co-evaluation PT/OT/SLP Co-Evaluation/Treatment: Yes Reason for Co-Treatment: For patient/therapist safety;To address functional/ADL transfers;Complexity of the patient's impairments (multi-system involvement) PT goals addressed during session: Mobility/safety with mobility;Balance;Proper use of DME OT goals addressed during session: ADL's and self-care;Proper use of Adaptive equipment and DME      AM-PAC OT "6 Clicks" Daily Activity     Outcome Measure Help from another person eating meals?: None Help from another person taking care of personal grooming?: A Little Help from another person toileting, which includes using toliet, bedpan, or urinal?: A Lot Help from another person bathing (  including washing, rinsing, drying)?: A Lot Help from another person to put on and taking off regular upper body clothing?: A Little Help from another person to put on and taking off regular lower body clothing?:  A Lot 6 Click Score: 16   End of Session Equipment Utilized During Treatment: Gait belt;Rolling walker Nurse Communication: Mobility status  Activity Tolerance: Patient tolerated treatment well Patient left: in chair;with call bell/phone within reach  OT Visit Diagnosis: Unsteadiness on feet (R26.81);Muscle weakness (generalized) (M62.81)                Time: 1010-1049 OT Time Calculation (min): 39 min Charges:  OT General Charges $OT Visit: 1 Visit OT Evaluation $OT Re-eval: 1 Re-eval OT Treatments $Self Care/Home Management : 8-22 mins  Gerrianne Scale, MS, OTR/L ascom [phone] 03/31/19, 3:00 PM

## 2019-03-31 NOTE — Progress Notes (Signed)
RT to patient bedside to assist with trache care and home ventilator placement. Husband at bedside as well. Per day shift report, home health came by to adjust alarm settings. Home vent settings are found on 450Vt, 16bpm, 5 Peep, with 2L oxygen bleed-in. Also noted, all alarms are OFF.  Tache care performed, patient suctioned, inner cannula replaced. Patient placed on Home Vent tolerating well. Husbands questions answered. Will continue to monitor.

## 2019-03-31 NOTE — Consult Note (Signed)
PHARMACY CONSULT NOTE - FOLLOW UP  Pharmacy Consult for Electrolyte Monitoring and Replacement   Angela David is a 79 y.o. female admitted on 03/20/2019 with generalized weakness, hypotension secondary to hypovolemia vs sepsis, acute hypoxic respiratory failure. 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 Pt is off ventilator and on a thin dysphagia diet. Feeding supplements BID between meals started 1/29. Pt is/may be having difficulty swallowing. Pharmacy consulted to assist with monitoring and replacing electrolytes.   Recent Labs: Potassium (mmol/L)  Date Value  03/31/2019 3.6  12/17/2011 4.1   Magnesium (mg/dL)  Date Value  03/31/2019 1.7  12/17/2011 2.2   Calcium (mg/dL)  Date Value  03/31/2019 9.2   Calcium, Total (mg/dL)  Date Value  12/17/2011 8.8   Albumin (g/dL)  Date Value  03/31/2019 2.5 (L)  08/28/2016 4.3  12/16/2011 4.1   Phosphorus (mg/dL)  Date Value  03/31/2019 2.7  12/16/2011 2.2 (L)   Sodium (mmol/L)  Date Value  03/31/2019 141  08/28/2016 143  12/17/2011 139    Assessment: 1. Electrolytes: (goal electrolytes WNL) No electrolyte supplementation required today She continues to receive lactated ringers MIVF at 100 mL/hr  2. Glucose: Last 24h range: 108 - 185 (2 unit SSI required)  Glucose-capillary monitored ACHS. Pt is on insulin aspart 0-9 units SQ ACHS. Patient on metformin outpt. Continue to monitor glucose levels daily and administration insulins.   Paulina Fusi, PharmD, BCPS 03/31/2019 3:59 PM

## 2019-03-31 NOTE — Progress Notes (Signed)
[place] visited pt. after pt. waved him into rm.  In brief visit, Pt. explained she anticipates discharge tomorrow; pt. very grateful for chaplains' visits the past few days and hopes it might be possible to keep in contact.  Dese[place] said he and CH Belenda Cruise will attempt to come say goodbye tomorrow morning before pt. is discharged.        03/31/19 1700  Clinical Encounter Type  Visited With Patient  Visit Type Follow-up;Social support;Psychological support  Spiritual Encounters  Spiritual Needs Emotional

## 2019-03-31 NOTE — Progress Notes (Signed)
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  She presented to [facility] ER on 01/22 via EMS, admitted with generalized weakness, hypotension secondary to hypovolemia vs. sepsis, acute on chronic hypercapnic hypoxic respiratory failure likely secondary to mucous plugging requiring mechanical ventilation via chronic tracheostomy. work up showed worsening hypercapnia and L bibasilar atelectasis, as well as subacute R rib fractures, admitted to ICU and ventilated from 1/22/-1/23. rapid response called 1/28 for loss of consciousness, respiratory acidosis and CO2 retention, returned to CCU, intubated. Extubated 1/29.    PT Comments    Patient alert, behavior WFLs. Requested to utilize bed pan prior to session, minA for rolling, able to minimally participate in self care in sidelying. Supine to sit performed with CGA, minA to assist scooting towards edge of bed. Sit <> stand with minAx2. Initially patient needed assistance with RW advancement and minA for balance during several steps to recliner. Pt also participate in standing exercises/tolerance this session with CGA-minA and RW. Overall the patient demonstrated improvement towards goals this session and would benefit from further skilled PT intervention. Current recommendation remains appropriate.  Of note PT/OT unable to obtain clear spO2 readings; initially pt on room air, trach collar in place for  majority of mobility. Pt reported no light headedness/dizziness with the exception of during weight shifts in standing.    Follow Up Recommendations  SNF     Equipment Recommendations  Other (comment)(TBD)    Recommendations for Other Services OT consult     Precautions / Restrictions Precautions Precautions: Fall Restrictions Weight Bearing Restrictions: No    Mobility  Bed Mobility Overal bed mobility: Needs Assistance Bed Mobility: Supine to Sit     Supine to sit: HOB elevated;Min guard        Transfers Overall transfer level: Needs assistance Equipment used: Rolling walker (2 wheeled) Transfers: Sit to/from Stand Sit to Stand: Min assist;+2 safety/equipment            Ambulation/Gait Ambulation/Gait assistance: Min assist;+2 safety/equipment Gait Distance (Feet): 2 Feet Assistive device: Rolling walker (2 wheeled)       General Gait Details: Pt needed assistance with RW management and minA for safe steps to chair in room.   Stairs             Wheelchair Mobility    Modified Rankin (Stroke Patients Only)       Balance Overall balance assessment: Needs assistance Sitting-balance support: Bilateral upper extremity supported Sitting balance-Leahy Scale: Fair       Standing balance-Leahy Scale: Fair Standing balance comment: RW reliant, but able to alernate lifting one arm at a time from [employer] Arousal/Alertness: Awake/alert Behavior During Therapy: [facility] for tasks assessed/performed Overall Cognitive Status: Within Functional Limits for tasks assessed  General Comments: Pt with paralyzed vocal cords and chronic trach, answers appropraitely-mouths answers, A&O      Exercises Other Exercises Other Exercises: Pt transferred to chair with minAx2, physical assist initially for RW, minA for balance. Pt instructed in hand placement during  transfers Other Exercises: The patient was able to stand for several minutes; cued for posture, LE weight bearing (intermittently minA to assist with balance). Alternating shoulder flexion, and weight shifts. Pt did not want to continue with weight shifts due to complaints of dizziness.    General Comments        Pertinent Vitals/Pain Pain Assessment: No/denies pain    Home Living                      Prior Function            PT Goals (current goals can now be found in the care plan section) Progress towards PT goals: Progressing toward goals    Frequency    Min 2X/week      PT Plan Current plan remains appropriate    Co-evaluation              AM-PAC PT "6 Clicks" Mobility   Outcome Measure  Help needed turning from your back to your side while in a flat bed without using bedrails?: A Little Help needed moving from lying on your back to sitting on the side of a flat bed without using bedrails?: A Little Help needed moving to and from a bed to a chair (including a wheelchair)?: A Lot Help needed standing up from a chair using your arms (e.g., wheelchair or bedside chair)?: A Lot Help needed to walk in hospital room?: A Lot Help needed climbing 3-5 steps with a railing? : Total 6 Click Score: 13    End of Session Equipment Utilized During Treatment: Oxygen(5L via trach collar) Activity Tolerance: Patient tolerated treatment well Patient left: with call bell/phone within reach;in bed;with bed alarm set Nurse Communication: Mobility status PT Visit Diagnosis: Muscle weakness (generalized) (M62.81);Other abnormalities of gait and mobility (R26.89);Difficulty in walking, not elsewhere classified (R26.2)     Time: 1010-1049 PT Time Calculation (min) (ACUTE ONLY): 39 min  Charges:  $Therapeutic Exercise: 8-22 mins                     Lieutenant Diego PT, DPT 11:49 AM,03/31/19

## 2019-04-01 DIAGNOSIS — R0902 Hypoxemia: Secondary | ICD-10-CM

## 2019-04-01 LAB — CBC WITH DIFFERENTIAL/PLATELET
Abs Immature Granulocytes: 0.05 10*3/uL (ref 0.00–0.07)
Basophils Absolute: 0 10*3/uL (ref 0.0–0.1)
Basophils Relative: 0 %
Eosinophils Absolute: 0.2 10*3/uL (ref 0.0–0.5)
Eosinophils Relative: 3 %
HCT: 32.2 % — ABNORMAL LOW (ref 36.0–46.0)
Hemoglobin: 9.9 g/dL — ABNORMAL LOW (ref 12.0–15.0)
Immature Granulocytes: 1 %
Lymphocytes Relative: 23 %
Lymphs Abs: 1.6 10*3/uL (ref 0.7–4.0)
MCH: 25.4 pg — ABNORMAL LOW (ref 26.0–34.0)
MCHC: 30.7 g/dL (ref 30.0–36.0)
MCV: 82.6 fL (ref 80.0–100.0)
Monocytes Absolute: 0.5 10*3/uL (ref 0.1–1.0)
Monocytes Relative: 7 %
Neutro Abs: 4.6 10*3/uL (ref 1.7–7.7)
Neutrophils Relative %: 66 %
Platelets: 198 10*3/uL (ref 150–400)
RBC: 3.9 MIL/uL (ref 3.87–5.11)
RDW: 14.5 % (ref 11.5–15.5)
WBC: 7 10*3/uL (ref 4.0–10.5)
nRBC: 0 % (ref 0.0–0.2)

## 2019-04-01 LAB — BASIC METABOLIC PANEL
Anion gap: 9 (ref 5–15)
BUN: 24 mg/dL — ABNORMAL HIGH (ref 8–23)
CO2: 27 mmol/L (ref 22–32)
Calcium: 9.4 mg/dL (ref 8.9–10.3)
Chloride: 105 mmol/L (ref 98–111)
Creatinine, Ser: 1.25 mg/dL — ABNORMAL HIGH (ref 0.44–1.00)
GFR calc Af Amer: 48 mL/min — ABNORMAL LOW (ref >60)
GFR calc non Af Amer: 42 mL/min — ABNORMAL LOW (ref >60)
Glucose, Bld: 126 mg/dL — ABNORMAL HIGH (ref 70–99)
Potassium: 3.7 mmol/L (ref 3.5–5.1)
Sodium: 141 mmol/L (ref 135–145)

## 2019-04-01 LAB — GLUCOSE, CAPILLARY
Glucose-Capillary: 120 mg/dL — ABNORMAL HIGH (ref 70–99)
Glucose-Capillary: 151 mg/dL — ABNORMAL HIGH (ref 70–99)

## 2019-04-01 LAB — MAGNESIUM: Magnesium: 1.7 mg/dL (ref 1.7–2.4)

## 2019-04-01 MED ORDER — ENSURE ENLIVE PO LIQD: 237.0000 mL | Freq: Two times a day (BID) | ORAL | 12 refills | Status: DC

## 2019-04-01 MED ORDER — ACETAMINOPHEN 325 MG PO TABS: 650.0000 mg | ORAL_TABLET | Freq: Four times a day (QID) | ORAL | 0 refills | Status: DC | PRN

## 2019-04-01 MED ORDER — MELATONIN 5 MG PO TABS: 5.0000 mg | ORAL_TABLET | Freq: Every day | ORAL | 0 refills | Status: DC

## 2019-04-01 MED ORDER — IPRATROPIUM-ALBUTEROL 0.5-2.5 (3) MG/3ML IN SOLN: 3.0000 mL | Freq: Four times a day (QID) | RESPIRATORY_TRACT | 0 refills | Status: DC | PRN

## 2019-04-01 MED ORDER — BUSPIRONE HCL 15 MG PO TABS: 15.0000 mg | ORAL_TABLET | Freq: Two times a day (BID) | ORAL | 0 refills | Status: DC

## 2019-04-01 MED ORDER — METOPROLOL TARTRATE 25 MG PO TABS: 25.0000 mg | ORAL_TABLET | Freq: Two times a day (BID) | ORAL | 0 refills | Status: DC

## 2019-04-01 MED ORDER — COMPRESSOR/NEBULIZER MISC: 0 refills | Status: DC

## 2019-04-01 MED ORDER — GABAPENTIN 400 MG PO CAPS: 400.0000 mg | ORAL_CAPSULE | Freq: Three times a day (TID) | ORAL | 0 refills | Status: DC

## 2019-04-01 NOTE — Consult Note (Signed)
PHARMACY CONSULT NOTE - FOLLOW UP  Pharmacy Consult for Electrolyte Monitoring and Replacement   Angela David is a 79 y.o. female admitted on 03/20/2019 with generalized weakness, hypotension secondary to hypovolemia vs sepsis, acute hypoxic respiratory failure. 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 Pt is off ventilator and on a thin dysphagia diet. Feeding supplements BID between meals started 1/29. Pt is/may be having difficulty swallowing. Pharmacy consulted to assist with monitoring and replacing electrolytes.   Recent Labs: Potassium (mmol/L)  Date Value  04/01/2019 3.7  12/17/2011 4.1   Magnesium (mg/dL)  Date Value  04/01/2019 1.7  12/17/2011 2.2   Calcium (mg/dL)  Date Value  04/01/2019 9.4   Calcium, Total (mg/dL)  Date Value  12/17/2011 8.8   Albumin (g/dL)  Date Value  03/31/2019 2.5 (L)  08/28/2016 4.3  12/16/2011 4.1   Phosphorus (mg/dL)  Date Value  03/31/2019 2.7  12/16/2011 2.2 (L)   Sodium (mmol/L)  Date Value  04/01/2019 141  08/28/2016 143  12/17/2011 139    Assessment: 1. Electrolytes: (goal electrolytes WNL) No electrolyte supplementation required today She continues to receive lactated ringers MIVF at 100 mL/hr  2. Glucose: Last 24h range: 108 - 162 (1 unit SSI required)  Glucose-capillary monitored ACHS. Pt is on insulin aspart 0-9 units SQ ACHS. Patient on metformin outpt. Continue to monitor glucose levels daily and administration insulins.   Vallery Sa, PharmD, BCPS 04/01/2019 8:48 AM

## 2019-04-01 NOTE — Progress Notes (Signed)
Patient placed on 28% ATC from Home Trilogy ventilator, tol well at this time with no complications. Sats 95%.

## 2019-04-01 NOTE — Discharge Summary (Signed)
Physician Discharge Summary  Patient ID: Angela David MRN: [id number] DOB/AGE: 1940-04-09 79 y.o.  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  Evidence of pneumonia, MRSA. 01/22: CT Head revealed no CT evidence for acute intracranial abnormality. Atrophy and presumed chronic small vessel ischemic change of the white matter.  Sinusitis noted. 01/22: CTA Chest revealed no pulmonary embolus. Dilated fluid-filled esophagus from the thoracic inlet to the gastroesophageal junction, may be reflux or dysmotility. Achalasia also considered. Mild dependent opacities in both lower lobes, likely atelectasis. Given esophageal dilatation, aspiration is also considered. Subacute right rib fractures. Some interval incomplete fracture healing since October 2020 injury. Stable right upper lobe scarring and benign pulmonary nodules. Aortic Atherosclerosis (ICD10-I70.0). 01/23: Pt admitted to ICU 03/22/19- Patient is clincally improved, of mechanical ventilation. Starting PT/OT with plan to transition to hospitalist service. 1/28 returned to ICU due to episode of loss of consciousness on MedSurg floor.  This was due to recurrent issues with hypercapnia it was determined patient would need at the very least nighttime ventilation.  Patient placed on mechanical  ventilation. 1/29 off vent during day, eating by mouth, speech evaluated.  Nighttime ventilation. 1/30 remains off vent during day, eating by mouth.  Continue nighttime mechanical ventilation.  Referred to DME for Trilogy home ventilator. 2/01 husband and niece to be trained  on Trilogy vent. 2/02 unable to use Trilogy vent last night due to alarms going off.  DME company needs to review settings 2/03 successful overnight use of Trilogy ventilator, stable and ready for discharge  Consults: None  Significant Diagnostic Studies: See multiple studies documented on chart.  Treatments: During the course of the hospitalization the patient received antibiotics, mechanical ventilation assistance and pulmonary toilet.  Discharge Exam: Blood pressure 104/77, pulse 84, temperature 98.6 F (37 C), temperature source Oral, resp. rate 20, height 5\' 3"  (1.6 m), weight 72.2 kg, SpO2 95 %.  GENERAL:NAD, no fevers, chills, no weakness no fatigue, interactive by writing notes HEAD: Normocephalic, atraumatic.  EYES: PERLA, EOMI No scleral icterus.  MOUTH: Oral mucosa moist, no thrush EAR, NOSE, THROAT: Clear without exudates. No external lesions NECK: Supple. No thyromegaly.  No JVD.  Tracheostomy in place  PULMONARY: CTA B/L, no adventitious sounds CARDIOVASCULAR: S1 and S2. Regular rate and rhythm. No murmurs GASTROINTESTINAL: Soft, nontender, nondistended. Positive bowel sounds.  MUSCULOSKELETAL: No swelling, clubbing, or edema.  NEUROLOGIC: No gross focal neurological deficits. 5/5 strength all extremities SKIN: No ulceration, lesions, rashes, or cyanosis.  PSYCHIATRIC: Insight, judgment intact.  Jovial today.   Disposition: Discharge disposition: 01-Home or Self Care       Discharge Instructions    Face-to-face encounter (required for Medicare/Medicaid patients)   Complete by: As directed    I Vernard Gambles certify that this patient is under my care and that I, or a nurse practitioner or  physician's assistant working with me, had a face-to-face encounter that meets the physician face-to-face encounter requirements with this patient on 04/01/2019. The  encounter with the patient was in whole, or in part for the following medical condition(s) which is the primary reason for home health care (List medical condition): Tracheal stenosis, tracheostomy and ventilator dependency   The encounter with the patient was in whole, or in part, for the following medical condition, which is the primary reason for home health care: Tracheal stenosis,tracheostomy deopendent, dependence on nocturnal ventilation   I certify that, based on my findings, the following services are medically necessary home health services:  Nursing Physical therapy Speech language pathology     Reason for Medically Necessary Home Health Services: Therapy- Therapeutic Exercises to Increase Strength and Endurance   My clinical findings support the need for the above services: Unable to leave home safely without assistance and/or assistive device   Further, I certify that my clinical findings support that this patient is homebound due to: Unable to leave home safely without assistance   Home Health   Complete by: As directed    To provide the following care/treatments:  PT OT [place] work     Mechanical ventilation   Complete by: As directed    450Vt, 16bpm, 5 Peep, with 2L oxygen bleed-in. Also noted, all alarms are OFF.  For Trilogy vent.     Allergies as of 04/01/2019      Reactions   Shellfish Allergy Shortness Of Breath   respitatory distress    Sodium Sulfite Shortness Of Breath   Severe Congestion that creates inability to breath   Codeine Other (See Comments)   Per patient "it kept her up all night"      Medication List    STOP taking these medications   Acetaminophen-Codeine 300-30 MG tablet   gabapentin 600 MG tablet Commonly known as: NEURONTIN Replaced  by: gabapentin 400 MG capsule   Melatonin 5 MG Caps Replaced by: Melatonin 5 MG Tabs     TAKE these medications   acetaminophen 325 MG tablet Commonly known as: TYLENOL Take 2 tablets (650 mg total) by mouth every 6 (six) hours as needed for moderate pain or fever. What changed:   medication strength  how much to take  when to take this  reasons to take this   Biotin 1000 MCG tablet Take 1,000 mcg by mouth daily.   busPIRone 15 MG tablet Commonly known as: BUSPAR Take 1 tablet (15 mg total) by mouth 2 (two) times daily. What changed: how much to take   Calcium Carbonate-Vitamin D 600-400 MG-UNIT tablet Take 1 tablet by mouth daily.   Compressor/Nebulizer Misc Nebulizer with supplies, trach collar for med admin.   escitalopram 20 MG tablet Commonly known as: LEXAPRO Take 20 mg by mouth daily. What changed: Another medication with the same name was removed. Continue taking this medication, and follow the directions you see here.   feeding supplement (ENSURE ENLIVE) Liqd Take 237 mLs by mouth 2 (two) times daily between meals. Start taking on: April 02, 2019   Ferrex 150 150 MG capsule Generic drug: iron polysaccharides Take by mouth.   folic acid 1 MG tablet Commonly known as: FOLVITE Take 1 mg by mouth daily.   gabapentin 400 MG capsule Commonly known as: NEURONTIN Take 1 capsule (400 mg total) by mouth 3 (three) times daily. Replaces: gabapentin 600 MG tablet   ipratropium-albuterol 0.5-2.5 (3) MG/3ML Soln Commonly known as: DUONEB Take 3 mLs by nebulization every 6 (six) hours as needed.   lovastatin 20 MG tablet Commonly known as:  MEVACOR Take 20 mg by mouth at bedtime.   meclizine 25 MG tablet Commonly known as: ANTIVERT Take 25 mg by mouth 2 (two) times daily as needed for dizziness.   Melatonin 5 MG Tabs Take 1 tablet (5 mg total) by mouth at bedtime. Replaces: Melatonin 5 MG Caps   metFORMIN 1000 MG tablet Commonly known as:  GLUCOPHAGE Take 1,000 mg by mouth 2 (two) times daily as needed (for BS >110 in the morning and >150 in the evening).   methocarbamol 500 MG tablet Commonly known as: Robaxin Take 1 tablet (500 mg total) by mouth every 8 (eight) hours as needed for muscle spasms.   metoprolol tartrate 25 MG tablet Commonly known as: LOPRESSOR Take 1 tablet (25 mg total) by mouth 2 (two) times daily. What changed:   when to take this  reasons to take this   ondansetron 4 MG tablet Commonly known as: Zofran Take 1 tablet (4 mg total) by mouth every 8 (eight) hours as needed for nausea or vomiting.   pantoprazole 40 MG tablet Commonly known as: PROTONIX Take 40 mg by mouth daily.   Restasis 0.05 % ophthalmic emulsion Generic drug: cycloSPORINE Place 1 drop into both eyes 2 (two) times daily.   rOPINIRole 0.5 MG tablet Commonly known as: REQUIP Take 0.5 mg by mouth at bedtime.        Signed: Renold Don, MD [facility] 04/01/2019, 2:39 PM

## 2019-04-01 NOTE — TOC Transition Note (Signed)
Transition of Care [facility]) - CM/SW Discharge Note   Patient Details  Name: Angela David MRN: [id number] Date of Birth: Jun 14, 1940  [facility] CM/SW Contact:  Candie Chroman, LCSW Phone Number: 04/01/2019, 3:08 PM   Clinical Narrative: Patient has orders to discharge home today. [facility] is aware. Per RN, EMS will pick her up around 4:00. CSW confirmed address with patient. No further concerns. CSW signing off.    Final next level of care: [place] Barriers to Discharge: Barriers Resolved   Patient Goals and CMS Choice Patient states their goals for this hospitalization and ongoing recovery are:: "I want to get back home"   Choice offered to / list presented to : Patient  Discharge Placement                Patient to be transferred to facility by: EMS   Patient and family notified of of transfer: 04/01/19  Discharge Plan and Services   Discharge Planning Services: CM Consult Post Acute Care Choice: Home Health          DME Arranged: NIV, Nebulizer machine DME Agency: [employer] Date DME Agency Contacted: 04/01/19   Representative spoke with at DME Agency: [place]: RN, PT, OT, Nurse's Aide, Social Work [employer] Agency: [place] ([place]) Date [place]: 04/01/19 Time [place]: 1130 Representative spoke with at [place]: [place] (SDOH) Interventions     Readmission Risk Interventions No flowsheet data found.

## 2019-04-01 NOTE — Discharge Instructions (Signed)
Acute Respiratory Failure, Adult  Acute respiratory failure occurs when there is not enough oxygen passing from your lungs to your body. When this happens, your lungs have trouble removing carbon dioxide from the blood. This causes your blood oxygen level to drop too low as carbon dioxide builds up. Acute respiratory failure is a medical emergency. It can develop quickly, but it is temporary if treated promptly. Your lung capacity, or how much air your lungs can hold, may improve with time, exercise, and treatment. What are the causes? There are many possible causes of acute respiratory failure, including:  Lung injury.  Chest injury or damage to the ribs or tissues near the lungs.  Lung conditions that affect the flow of air and blood into and out of the lungs, such as pneumonia, acute respiratory distress syndrome, and cystic fibrosis.  Medical conditions, such as strokes or spinal cord injuries, that affect the muscles and nerves that control breathing.  Blood infection (sepsis).  Inflammation of the pancreas (pancreatitis).  A blood clot in the lungs (pulmonary embolism).  A large-volume blood transfusion.  Burns.  Near-drowning.  Seizure.  Smoke inhalation.  Reaction to medicines.  Alcohol or drug overdose. What increases the risk? This condition is more likely to develop in people who have:  A blocked airway.  Asthma.  A condition or disease that damages or weakens the muscles, nerves, bones, or tissues that are involved in breathing.  A serious infection.  A health problem that blocks the unconscious reflex that is involved in breathing, such as hypothyroidism or sleep apnea.  A lung injury or trauma. What are the signs or symptoms? Trouble breathing is the main symptom of acute respiratory failure. Symptoms may also include:  Rapid breathing.  Restlessness or anxiety.  Skin, lips, or fingernails that appear blue (cyanosis).  Rapid heart  rate.  Abnormal heart rhythms (arrhythmias).  Confusion or changes in behavior.  Tiredness or loss of energy.  Feeling sleepy or having a loss of consciousness. How is this diagnosed? Your health care provider can diagnose acute respiratory failure with a medical history and physical exam. During the exam, your health care provider will listen to your heart and check for crackling or wheezing sounds in your lungs. Your may also have tests to confirm the diagnosis and determine what is causing respiratory failure. These tests may include:  Measuring the amount of oxygen in your blood (pulse oximetry). The measurement comes from a small device that is placed on your finger, earlobe, or toe.  Other blood tests to measure blood gases and to look for signs of infection.  Sampling your cerebral spinal fluid or tracheal fluid to check for infections.  Chest X-ray to look for fluid in spaces that should be filled with air.  Electrocardiogram (ECG) to look at the heart's electrical activity. How is this treated? Treatment for this condition usually takes places in a hospital intensive care unit (ICU). Treatment depends on what is causing the condition. It may include one or more treatments until your symptoms improve. Treatment may include:  Supplemental oxygen. Extra oxygen is given through a tube in the nose, a face mask, or a hood.  A device such as a continuous positive airway pressure (CPAP) or bi-level positive airway pressure (BiPAP or BPAP) machine. This treatment uses mild air pressure to keep the airways open. A mask or other device will be placed over your nose or mouth. A tube that is connected to a motor will deliver oxygen through  the mask. °· Ventilator. This treatment helps move air into and out of the lungs. This may be done with a bag and mask or a machine. For this treatment, a tube is placed in your windpipe (trachea) so air and oxygen can flow to the lungs. °· Extracorporeal  membrane oxygenation (ECMO). This treatment temporarily takes over the function of the heart and lungs, supplying oxygen and removing carbon dioxide. ECMO gives the lungs a chance to recover. It may be used if a ventilator is not effective. °· Tracheostomy. This is a procedure that creates a hole in the neck to insert a breathing tube. °· Receiving fluids and medicines. °· Rocking the bed to help breathing. °Follow these instructions at home: °· Take over-the-counter and prescription medicines only as told by your health care provider. °· Return to normal activities as told by your health care provider. Ask your health care provider what activities are safe for you. °· Keep all follow-up visits as told by your health care provider. This is important. °How is this prevented? °Treating infections and medical conditions that may lead to acute respiratory failure can help prevent the condition from developing. °Contact a health care provider if: °· You have a fever. °· Your symptoms do not improve or they get worse. °Get help right away if: °· You are having trouble breathing. °· You lose consciousness. °· Your have cyanosis or turn blue. °· You develop a rapid heart rate. °· You are confused. °These symptoms may represent a serious problem that is an emergency. Do not wait to see if the symptoms will go away. Get medical help right away. Call your local emergency services (911 in the [place] Do not drive yourself to the hospital. °This information is not intended to replace advice given to you by your health care provider. Make sure you discuss any questions you have with your health care provider. °Document Revised: 01/25/2017 Document Reviewed: 08/31/2015 °Elsevier Patient Education © 2020 [employer] °Acute Respiratory Failure, Adult ° °Acute respiratory failure occurs when there is not enough oxygen passing from your lungs to your body. When this happens, your lungs have trouble removing carbon dioxide from the  blood. This causes your blood oxygen level to drop too low as carbon dioxide builds up. °Acute respiratory failure is a medical emergency. It can develop quickly, but it is temporary if treated promptly. Your lung capacity, or how much air your lungs can hold, may improve with time, exercise, and treatment. °What are the causes? °There are many possible causes of acute respiratory failure, including: °· Lung injury. °· Chest injury or damage to the ribs or tissues near the lungs. °· Lung conditions that affect the flow of air and blood into and out of the lungs, such as pneumonia, acute respiratory distress syndrome, and cystic fibrosis. °· Medical conditions, such as strokes or spinal cord injuries, that affect the muscles and nerves that control breathing. °· Blood infection (sepsis). °· Inflammation of the pancreas (pancreatitis). °· A blood clot in the lungs (pulmonary embolism). °· A large-volume blood transfusion. °· Burns. °· Near-drowning. °· Seizure. °· Smoke inhalation. °· Reaction to medicines. °· Alcohol or drug overdose. °What increases the risk? °This condition is more likely to develop in people who have: °· A blocked airway. °· Asthma. °· A condition or disease that damages or weakens the muscles, nerves, bones, or tissues that are involved in breathing. °· A serious infection. °· A health problem that blocks the unconscious reflex that is involved   in breathing, such as hypothyroidism or sleep apnea. °· A lung injury or trauma. °What are the signs or symptoms? °Trouble breathing is the main symptom of acute respiratory failure. Symptoms may also include: °· Rapid breathing. °· Restlessness or anxiety. °· Skin, lips, or fingernails that appear blue (cyanosis). °· Rapid heart rate. °· Abnormal heart rhythms (arrhythmias). °· Confusion or changes in behavior. °· Tiredness or loss of energy. °· Feeling sleepy or having a loss of consciousness. °How is this diagnosed? °Your health care provider can  diagnose acute respiratory failure with a medical history and physical exam. During the exam, your health care provider will listen to your heart and check for crackling or wheezing sounds in your lungs. Your may also have tests to confirm the diagnosis and determine what is causing respiratory failure. These tests may include: °· Measuring the amount of oxygen in your blood (pulse oximetry). The measurement comes from a small device that is placed on your finger, earlobe, or toe. °· Other blood tests to measure blood gases and to look for signs of infection. °· Sampling your cerebral spinal fluid or tracheal fluid to check for infections. °· Chest X-ray to look for fluid in spaces that should be filled with air. °· Electrocardiogram (ECG) to look at the heart's electrical activity. °How is this treated? °Treatment for this condition usually takes places in a hospital intensive care unit (ICU). Treatment depends on what is causing the condition. It may include one or more treatments until your symptoms improve. Treatment may include: °· Supplemental oxygen. Extra oxygen is given through a tube in the nose, a face mask, or a hood. °· A device such as a continuous positive airway pressure (CPAP) or bi-level positive airway pressure (BiPAP or BPAP) machine. This treatment uses mild air pressure to keep the airways open. A mask or other device will be placed over your nose or mouth. A tube that is connected to a motor will deliver oxygen through the mask. °· Ventilator. This treatment helps move air into and out of the lungs. This may be done with a bag and mask or a machine. For this treatment, a tube is placed in your windpipe (trachea) so air and oxygen can flow to the lungs. °· Extracorporeal membrane oxygenation (ECMO). This treatment temporarily takes over the function of the heart and lungs, supplying oxygen and removing carbon dioxide. ECMO gives the lungs a chance to recover. It may be used if a ventilator is  not effective. °· Tracheostomy. This is a procedure that creates a hole in the neck to insert a breathing tube. °· Receiving fluids and medicines. °· Rocking the bed to help breathing. °Follow these instructions at home: °· Take over-the-counter and prescription medicines only as told by your health care provider. °· Return to normal activities as told by your health care provider. Ask your health care provider what activities are safe for you. °· Keep all follow-up visits as told by your health care provider. This is important. °How is this prevented? °Treating infections and medical conditions that may lead to acute respiratory failure can help prevent the condition from developing. °Contact a health care provider if: °· You have a fever. °· Your symptoms do not improve or they get worse. °Get help right away if: °· You are having trouble breathing. °· You lose consciousness. °· Your have cyanosis or turn blue. °· You develop a rapid heart rate. °· You are confused. °These symptoms may represent a serious problem that is   an emergency. Do not wait to see if the symptoms will go away. Get medical help right away. Call your local emergency services (911 in the [place] Do not drive yourself to the hospital. °This information is not intended to replace advice given to you by your health care provider. Make sure you discuss any questions you have with your health care provider. °Document Revised: 01/25/2017 Document Reviewed: 08/31/2015 °Elsevier Patient Education © 2020 Elsevier Inc. ° °

## 2019-04-01 NOTE — Progress Notes (Signed)
Pt discharged at this time. Taken home by EMS. Husband is aware and at home waiting for patient. VSS prior to discharge on room air. Home vent sent with patient.

## 2019-04-01 NOTE — Progress Notes (Signed)
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  She presented to [facility] ER on 01/22 via EMS, admitted with generalized weakness, hypotension secondary to hypovolemia vs. sepsis, acute on chronic hypercapnic hypoxic respiratory failure likely secondary to mucous plugging requiring mechanical ventilation via chronic tracheostomy. work up showed worsening hypercapnia and L bibasilar atelectasis, as well as subacute R rib fractures, admitted to ICU and ventilated from 1/22/-1/23. rapid response called 1/28 for loss of consciousness, respiratory acidosis and CO2 retention, returned to CCU, intubated. Extubated 1/29.    PT Comments    Pt alert, reported back pain 8/10 RN informed and stated she would administer tylenol after PT. Overall the patient demonstrated good progression towards goals, Supine to sit with HOB elevated and CGA, improved tolerance and quality of movement. Sit <> stand performed from elevated bed surface, minA for initial balance, CGA for stand pivot to chair with RW. Attempted from recliner, extended time needed but pt able to perform with CGA, use of chair for stabilization against posterior legs. Occasional cues for hand placement to increase safety. The patient was able to perform 1-2 minutes in standing. PT assisted pt in set up for lunch at end of session. Overall the patient would benefit from further skilled PT intervention to  return to PLOF as able.   Of note pt placed on room air by respiratory at start of session; potentially some desaturation noted after stand pivot transfer and definitely after coughing later in session. With rest and time pt improved 90% or greater.      Follow Up Recommendations  SNF     Equipment Recommendations  Other (comment)(TBD)    Recommendations for Other Services OT consult     Precautions / Restrictions Precautions Precautions: Fall Restrictions Weight Bearing Restrictions: No    Mobility  Bed Mobility Overal bed mobility: Needs Assistance Bed Mobility: Supine to Sit     Supine to sit: HOB elevated;Min guard        Transfers Overall transfer level: Needs assistance Equipment used: Rolling walker (2 wheeled) Transfers: Sit to/from Stand Sit to Stand: Min guard Stand pivot transfers: Min assist       General transfer comment: attempted from elevated bed surface, minA for initial balance. Attempted from recliner, extended time needed but pt able to perform with CGA, use of chair for stabilization against posterior legs.  Ambulation/Gait                 Stairs             Wheelchair Mobility    Modified Rankin (Stroke Patients Only)       Balance Overall balance assessment: Needs assistance Sitting-balance support: Bilateral upper extremity supported Sitting balance-Leahy Scale: Fair       Standing balance-Leahy Scale: Fair Standing balance comment: variable level of assistance needed; minA first attempt for initial standing balance, occasionally during transfer to chair. second attempt pt able to complete with just CGA.  Cognition Arousal/Alertness: Awake/alert Behavior During Therapy: WFL for tasks assessed/performed Overall Cognitive Status: Within Functional Limits for tasks assessed                                        Exercises Other Exercises Other Exercises: Pt  able to stand for 1-24minutes once achieved standing balance. Pt sat after 2 minutes due to complaints of bilateral knee weakness. Other Exercises: Pt placed on room air prior to mobilization by respiratory. mild desaturation noted once in chair and after coughing, improved >90% with rest and time.    General Comments        Pertinent Vitals/Pain Pain Assessment: 0-10 Pain Score: 8  Pain Location: low back pain Pain Descriptors / Indicators: Aching Pain Intervention(s): Monitored during session;Repositioned;Patient requesting pain meds-RN notified    Home Living                      Prior Function            PT Goals (current goals can now be found in the care plan section) Progress towards PT goals: Progressing toward goals    Frequency    Min 2X/week      PT Plan Current plan remains appropriate    Co-evaluation              AM-PAC PT "6 Clicks" Mobility   Outcome Measure  Help needed turning from your back to your side while in a flat bed without using bedrails?: A Little Help needed moving from lying on your back to sitting on the side of a flat bed without using bedrails?: A Little Help needed moving to and from a bed to a chair (including a wheelchair)?: A Little Help needed standing up from a chair using your arms (e.g., wheelchair or bedside chair)?: None Help needed to walk in hospital room?: A Lot Help needed climbing 3-5 steps with a railing? : Total 6 Click Score: 16    End of Session Equipment Utilized During Treatment: Gait belt Activity Tolerance: Patient tolerated treatment well Patient left: in bed;with call bell/phone within reach Nurse Communication: Mobility status PT Visit Diagnosis: Muscle weakness (generalized) (M62.81);Other abnormalities of gait and mobility (R26.89);Difficulty in walking, not elsewhere classified (R26.2)     Time: [id number] PT Time Calculation (min) (ACUTE ONLY): 33 min  Charges:  $Therapeutic Exercise:  23-37 mins                     Lieutenant Diego PT, DPT 12:20 PM,04/01/19

## 2019-04-08 ENCOUNTER — Ambulatory Visit
Admission: RE | Admit: 2019-04-08 | Discharge: 2019-04-08 | Disposition: A | Discharge status: Home / Self Care | Payer: Medicare Other | Source: Ambulatory Visit | Attending: Surgery | Admitting: Surgery

## 2019-04-08 DIAGNOSIS — Z1231 Encounter for screening mammogram for malignant neoplasm of breast: Secondary | ICD-10-CM | POA: Insufficient documentation

## 2019-04-09 ENCOUNTER — Telehealth: Payer: Self-pay | Admitting: Emergency Medicine

## 2019-04-09 NOTE — Telephone Encounter (Signed)
Spoke with patient's husband to notify him of patient's normal recent mammogram.

## 2019-04-09 NOTE — Telephone Encounter (Signed)
-----   Message from Jules Husbands, MD sent at 04/09/2019  3:45 PM EST ----- Please let her know mammo was nml ----- Message ----- From: Interface, Rad Results In Sent: 04/09/2019  11:00 AM EST To: Jules Husbands, MD

## 2019-04-09 NOTE — Telephone Encounter (Signed)
Called patient with no answer. Left message to give office a call back.

## 2019-04-20 ENCOUNTER — Ambulatory Visit: Payer: Medicare Other | Admitting: Surgery

## 2019-04-22 ENCOUNTER — Encounter: Payer: Self-pay | Admitting: Surgery

## 2019-04-22 ENCOUNTER — Ambulatory Visit (INDEPENDENT_AMBULATORY_CARE_PROVIDER_SITE_OTHER): Payer: Medicare Other | Admitting: Surgery

## 2019-04-22 ENCOUNTER — Other Ambulatory Visit: Payer: Self-pay

## 2019-04-22 VITALS — BP 99/65 | HR 71 | Temp 97.3°F | Resp 16 | Wt 147.0 lb

## 2019-04-22 DIAGNOSIS — R2242 Localized swelling, mass and lump, left lower limb: Secondary | ICD-10-CM

## 2019-04-22 NOTE — Patient Instructions (Addendum)
We will get you scheduled for a MRI of your left leg. We will have you follow up in 2-3 weeks to over the results.   You are scheduled for a MRI scan of your left leg at [facility] on March 8th at 3:00 pm. Please arrive there by 2:30 pm. Go in through the [place].    Please be sure to increase your fluid intake.

## 2019-04-25 ENCOUNTER — Encounter: Payer: Self-pay | Admitting: Surgery

## 2019-04-25 NOTE — Progress Notes (Signed)
Outpatient Surgical Follow Up  04/25/2019  Angela David is an 79 y.o. female.   Chief Complaint  Patient presents with  . Follow-up    HPI: Angela David is very well-known to me with prior history of right colectomy for unresectable polyp and did very well.  She needed now comes in for follow-up for her breast exam yearly and review of the mammogram.  Mammogram personally reviewed showing no evidence of any suspicious lesions.  She denies any specific concerns for her breast.  No discharge no masses.  She does state that there is a spot on the left upper thigh that is tender and she thinks this is a mass.  She has had a rocky course including Covid pneumonitis then respiratory failure requiring ventilatory support.  Currently she is on ventilatory support at night.  She is wearing a nonfenestrated trach and is unable to speak but she is able to write words.  Past Medical History:  Diagnosis Date  . Breast cancer (Angela David) 2002   right breast chemo and radiation  . Cancer [facility]) 2002   right breast  . Diabetes mellitus without complication (Angela David)   . Diabetic peripheral neuropathy (Angela David) 08/28/2016  . GERD (gastroesophageal reflux disease)   . Glaucoma   . High cholesterol   . History of colon polyps   . Peripheral neuropathy 08/28/2016  . Personal history of chemotherapy 2002   BREAST CA  . Personal history of malignant neoplasm of breast   . Personal history of radiation therapy 2002   BREAST CA  . Vocal cord paralysis 2012    Past Surgical History:  Procedure Laterality Date  . ABDOMINAL HYSTERECTOMY    . BREAST BIOPSY Right 2008   neg  . BREAST EXCISIONAL BIOPSY Right 2002   positive  . BREAST LUMPECTOMY Right 2002   chemo and radiation  . BREAST SURGERY Right 2002   LUMPECTOMY, radiation, chemotherapy  . CHOLECYSTECTOMY N/A 12/10/2017   Procedure: LAPAROSCOPIC CHOLECYSTECTOMY;  Surgeon: Benjamine Sprague, DO;  Location: [facility] ORS;  Service: General;  Laterality: N/A;  . COLON  RESECTION Right 11/06/2018   Procedure: HAND ASSISTED LAPAROSCOPIC COLON RESECTION, RIGHT;  Surgeon: Angela Husbands, MD;  Location: [facility] ORS;  Service: General;  Laterality: Right;  . COLONOSCOPY  2009,12/30/2013  . COLONOSCOPY WITH PROPOFOL N/A 04/16/2018   Tubulovillous polyp proximal transverse colon, large sessile polyp, proximal transverse colon;  Surgeon: Angela Bellow, MD;  Location: [facility] ENDOSCOPY;  Service: Endoscopy;  Laterality: N/A;  . ESOPHAGOGASTRODUODENOSCOPY (EGD) WITH PROPOFOL N/A 10/14/2018   Procedure: ESOPHAGOGASTRODUODENOSCOPY (EGD) WITH PROPOFOL;  Surgeon: Angela Lame, MD;  Location: [facility] ENDOSCOPY;  Service: Endoscopy;  Laterality: N/A;  . HYDROCELE EXCISION / REPAIR    . TRACHEAL SURGERY  2013  . TRACHEOSTOMY N/A    Dr Angela David    Family History  Problem Relation Age of Onset  . Cancer Father        bone cancer  . Cancer Sister        lymphoma  . Cancer Brother   . Breast cancer Neg Hx     Social History:  reports that she has never smoked. She has never used smokeless tobacco. She reports that she does not drink alcohol or use drugs.  Allergies:  Allergies  Allergen Reactions  . Shellfish Allergy Shortness Of Breath    respitatory distress   . Sodium Sulfite Shortness Of Breath    Severe Congestion that creates inability to breath  . Codeine Other (See Comments)  Per patient "it kept her up all night"    Medications reviewed.    ROS Full ROS performed and is otherwise negative other than what is stated in HPI   BP 99/65   Pulse 71   Temp (!) 97.3 F (36.3 C)   Resp 16   Wt 147 lb (66.7 kg)   SpO2 96%   BMI 26.04 kg/m   Physical Exam  She is in no acute distress awake and alert Neck: Trach in place. No bleeding no masses Breast:  Chest: no resp distress, no use of acc. MusclesCTA, CV:  s1,s2, no murmurs Abd: soft , nt Ext: There is some mild tenderness to palpation on the proximal left thigh near the inguinal region.  There is  no evidence of hernias.  Difficult to say whether this is submuscular lipoma or not.  No evidence of solid masses.  There is no evidence of lymphadenopathy in the groin   Assessment/Plan: Normal mammogram and breast exam.  Continue yearly follow-up. Regarding the left thigh mass I will obtain a CT scan to delineate potential mass. We will follow her up in a few weeks. Greater than 50% of the 25 minutes  visit was spent in counseling/coordination of care   Angela Hamman, MD [place] Surgeon

## 2019-05-04 ENCOUNTER — Ambulatory Visit: Payer: Medicare Other

## 2019-05-05 DIAGNOSIS — D473 Essential (hemorrhagic) thrombocythemia: Secondary | ICD-10-CM | POA: Insufficient documentation

## 2019-05-05 DIAGNOSIS — G620 Drug-induced polyneuropathy: Secondary | ICD-10-CM | POA: Insufficient documentation

## 2019-05-05 NOTE — Progress Notes (Signed)
This encounter was created in error - please disregard.

## 2019-05-11 ENCOUNTER — Ambulatory Visit: Payer: Medicare Other | Admitting: Surgery

## 2019-05-12 ENCOUNTER — Ambulatory Visit (INDEPENDENT_AMBULATORY_CARE_PROVIDER_SITE_OTHER): Payer: Medicare Other | Admitting: Internal Medicine

## 2019-05-12 ENCOUNTER — Encounter: Payer: Self-pay | Admitting: Internal Medicine

## 2019-05-12 ENCOUNTER — Other Ambulatory Visit: Payer: Self-pay

## 2019-05-12 VITALS — BP 102/60 | HR 69 | Temp 97.5°F | Ht 64.0 in | Wt 147.6 lb

## 2019-05-12 DIAGNOSIS — J9602 Acute respiratory failure with hypercapnia: Secondary | ICD-10-CM | POA: Diagnosis not present

## 2019-05-12 DIAGNOSIS — J9601 Acute respiratory failure with hypoxia: Secondary | ICD-10-CM

## 2019-05-12 NOTE — Progress Notes (Signed)
Name: Angela David MRN: [id number] DOB: 08-12-1940     CONSULTATION DATE: 05/12/2019 REFERRING MD : Hospital follow   SYNOPSIS Acute on chronic hypercarbic and hypoxic respiratory failure Chronic dependency on tracheostomy VOCAL CORD DYSFUNCTION +MRSA pneumonia Esophageal dilatation/dysmotility by CT study Chronic dependency on tracheostomy History of tracheal stenosis History of vocal cord dysfunction   RECENT HOSPITAL ADMISSION 01/22: Pt with chronic tracheostomy presented to ER with generalized weakness found to be hypercapnic requiring mechanical ventilation.  Evidence of pneumonia, MRSA. 01/22: CT Head revealed no CT evidence for acute intracranial abnormality. Atrophy and presumed chronic small vessel ischemic change of the white matter.  Sinusitis noted. 01/22: CTA Chest revealed no pulmonary embolus. Dilated fluid-filled esophagus from the thoracic inlet to the gastroesophageal junction, may be reflux or dysmotility. Achalasia also considered. Mild dependent opacities in both lower lobes, likely atelectasis. Given esophageal dilatation, aspiration is also considered. Subacute right rib fractures. Some interval incomplete fracture healing since October 2020 injury. Stable right upper lobe scarring and benign pulmonary nodules. Aortic Atherosclerosis (ICD10-I70.0). 01/23: Pt admitted to ICU 03/22/19- Patient is clincally improved, of mechanical ventilation. Starting PT/OT with plan to transition to hospitalist service. 1/28 returned to ICU due to episode of loss of consciousness on MedSurg floor.  This was due to recurrent issues with hypercapnia it was determined patient would need at the very least nighttime ventilation.  Patient placed on mechanical ventilation. 1/29 off vent during day, eating by mouth, speech evaluated.  Nighttime ventilation. 1/30 remains off vent during day, eating by mouth.  Continue nighttime mechanical ventilation.  Referred to DME for Trilogy  home ventilator. 2/01 husband and niece to be trained  on Trilogy vent. 2/02 unable to use Trilogy vent last night due to alarms going off.  DME company needs to review settings 2/03 successful overnight use of Trilogy ventilator, stable and ready for discharge  CHIEF COMPLAINT:  Follow-up chronic respiratory failure Chronic vent dependent Chronic trach History of pneumonia MRSA     HISTORY OF PRESENT ILLNESS:  Overall patient has been getting her strength back Patient does not in any acute distress Status post trach chronic Currently patient has a cuffed nonfenestrated trach Unable to vocalize however will need referral to ENT Dr. Tami Ribas to help assist with trach change with a cuffed fenestrated trach  No signs and symptoms of infection at this time Patient seems to get her strength back however still needs trilogy ventilator therapy for survival and breathing at nighttime  Patient tolerating well Patient states she is eating well however she needs to eat small bites   No evidence of heart failure at this time No evidence or signs of infection at this time No respiratory distress No fevers, chills, nausea, vomiting, diarrhea No evidence of lower extremity edema No evidence hemoptysis      PAST MEDICAL HISTORY :   has a past medical history of Breast cancer ([place]) (2002), Cancer ([place]) (2002), Diabetes mellitus without complication ([place]), Diabetic peripheral neuropathy ([place]) (08/28/2016), GERD (gastroesophageal reflux disease), Glaucoma, High cholesterol, History of colon polyps, Peripheral neuropathy (08/28/2016), Personal history of chemotherapy (2002), Personal history of malignant neoplasm of breast, Personal history of radiation therapy (2002), and Vocal cord paralysis (2012).  has a past surgical history that includes Abdominal hysterectomy; Hydrocele excision / repair; Colonoscopy (2009,12/30/2013); Tracheal surgery (2013); Breast surgery (Right, 2002); Tracheostomy (N/A);  Cholecystectomy (N/A, 12/10/2017); Colonoscopy with propofol (N/A, 04/16/2018); Esophagogastroduodenoscopy (egd) with propofol (N/A, 10/14/2018); Colon resection (Right, 11/06/2018); Breast lumpectomy (Right, 2002); Breast biopsy (  Right, 2008); and Breast excisional biopsy (Right, 2002). Prior to Admission medications   Medication Sig Start Date End Date Taking? Authorizing Provider  acetaminophen (TYLENOL) 325 MG tablet Take 2 tablets (650 mg total) by mouth every 6 (six) hours as needed for moderate pain or fever. 04/01/19   Tyler Pita, MD  Biotin 1000 MCG tablet Take 1,000 mcg by mouth daily.     [provider]  busPIRone (BUSPAR) 15 MG tablet Take 1 tablet (15 mg total) by mouth 2 (two) times daily. 04/01/19   Tyler Pita, MD  Calcium Carbonate-Vitamin D 600-400 MG-UNIT tablet Take 1 tablet by mouth daily.     [provider]  escitalopram (LEXAPRO) 20 MG tablet Take 20 mg by mouth daily.    [provider]  feeding supplement, ENSURE ENLIVE, (ENSURE ENLIVE) LIQD Take 237 mLs by mouth 2 (two) times daily between meals. 04/02/19   Tyler Pita, MD  folic acid (FOLVITE) 1 MG tablet Take 1 mg by mouth daily.    [provider]  gabapentin (NEURONTIN) 400 MG capsule Take 1 capsule (400 mg total) by mouth 3 (three) times daily. 04/01/19   Tyler Pita, MD  ipratropium-albuterol (DUONEB) 0.5-2.5 (3) MG/3ML SOLN Take 3 mLs by nebulization every 6 (six) hours as needed. 04/01/19   Tyler Pita, MD  iron polysaccharides (FERREX 150) 150 MG capsule Take by mouth. 04/30/18 04/30/19  [provider]  lovastatin (MEVACOR) 20 MG tablet Take 20 mg by mouth at bedtime.    [provider]  meclizine (ANTIVERT) 25 MG tablet Take 25 mg by mouth 2 (two) times daily as needed for dizziness.     [provider]  Melatonin 5 MG TABS Take 1 tablet (5 mg total) by mouth at bedtime. 04/01/19   Tyler Pita, MD  metFORMIN (GLUCOPHAGE) 1000  MG tablet Take 1,000 mg by mouth 2 (two) times daily as needed (for BS >110 in the morning and >150 in the evening).     [provider]  methocarbamol (ROBAXIN) 500 MG tablet Take 1 tablet (500 mg total) by mouth every 8 (eight) hours as needed for muscle spasms. 12/29/18   Tylene Fantasia, PA-C  metoprolol tartrate (LOPRESSOR) 25 MG tablet Take 1 tablet (25 mg total) by mouth 2 (two) times daily. 04/01/19   Tyler Pita, MD  Nebulizers (COMPRESSOR/NEBULIZER) MISC Nebulizer with supplies, trach collar for med admin. 04/01/19   Tyler Pita, MD  ondansetron (ZOFRAN) 4 MG tablet Take 1 tablet (4 mg total) by mouth every 8 (eight) hours as needed for nausea or vomiting. 11/09/18   Pabon, Diego F, MD  pantoprazole (PROTONIX) 40 MG tablet Take 40 mg by mouth daily.     [provider]  RESTASIS 0.05 % ophthalmic emulsion Place 1 drop into both eyes 2 (two) times daily.     [provider]  rOPINIRole (REQUIP) 0.5 MG tablet Take 0.5 mg by mouth at bedtime.  06/09/18   [provider]   Allergies  Allergen Reactions  . Shellfish Allergy Shortness Of Breath    respitatory distress   . Sodium Sulfite Shortness Of Breath    Severe Congestion that creates inability to breath  . Codeine Other (See Comments)    Per patient "it kept her up all night"    FAMILY HISTORY:  family history includes Cancer in her brother, father, and sister. SOCIAL HISTORY:  reports that she has never smoked. She has never  used smokeless tobacco. She reports that she does not drink alcohol or use drugs.    Review of Systems:  Gen:  Denies  fever, sweats, chills weigh loss  HEENT: Denies blurred vision, double vision, ear pain, eye pain, hearing loss, nose bleeds, sore throat Cardiac:  No dizziness, chest pain or heaviness, chest tightness,edema, No JVD Resp:   No cough, -sputum production, -shortness of breath,-wheezing, -hemoptysis,  Gi: Denies swallowing difficulty, stomach  pain, nausea or vomiting, diarrhea, constipation, bowel incontinence Gu:  Denies bladder incontinence, burning urine Ext:   Denies Joint pain, stiffness or swelling Skin: Denies  skin rash, easy bruising or bleeding or hives Endoc:  Denies polyuria, polydipsia , polyphagia or weight change Psych:   Denies depression, insomnia or hallucinations  Other:  All other systems negative   BP 102/60 (BP Location: Left Arm, Cuff Size: Normal)   Pulse 69   Temp (!) 97.5 F (36.4 C) (Temporal)   Ht 5\' 4"  (1.626 m)   Wt 147 lb 9.6 oz (67 kg)   SpO2 98%   BMI 25.34 kg/m    Physical Examination:   General Appearance: No distress  Neuro:without focal findings,  speech normal,  HEENT: PERRLA, EOM intact.  Status post trach Pulmonary: normal breath sounds, No wheezing.  CardiovascularNormal S1,S2.  No m/r/g.   Abdomen: Benign, Soft, non-tender. Renal:  No costovertebral tenderness  GU:  Not performed at this time. Endoc: No evident thyromegaly Skin:   warm, no rashes, no ecchymosis  Extremities: normal, no cyanosis, clubbing. PSYCHIATRIC: Mood, affect within normal limits.    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  The importance of social distancing was discussed today. Hand Washing Techniques and avoid touching face was advised.     MEDICATION ADJUSTMENTS/LABS AND TESTS ORDERED: Continue trilogy ventilator therapy as prescribed ENT referral to ENT Dr. Tami Ribas for fenestrated cuffed trach    CURRENT MEDICATIONS REVIEWED AT [place]   Patient satisfied with Plan of action and management. All questions answered  Follow up in 6 months   Jezelle Gullick Patricia Pesa, M.D.  [facility]  Medical Director [place] Director [facility] Cardio-Pulmonary Department

## 2019-05-12 NOTE — Patient Instructions (Signed)
Continue TRILOGY  ventilator therapy for chronic respiratory failure at night  REFERRAL TO ENT DR Dimock

## 2019-05-14 ENCOUNTER — Inpatient Hospital Stay: Payer: Medicare Other

## 2019-05-14 ENCOUNTER — Inpatient Hospital Stay: Payer: Medicare Other | Admitting: Hematology and Oncology

## 2019-05-14 NOTE — Progress Notes (Deleted)
[facility]  [address] [place], [place] [address] Phone: [phone]  Fax: [phone]   Clinic Day:  05/14/2019  Referring physician: Tracie Harrier, MD  Chief Complaint: Angela David is a 79 y.o. 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  HPI:  The patient was last seen in the medical oncology clinic on 09/05/2018 for new patient assessment.  She had a history of stage II breast cancer in 2002.  She was s/p lumpectomy, adjuvant chemotherapy, radiation, and 5 years of tamoxifen.  Mammogram on 01/29/2018 revealed no evidence of disease.  CA27.29 was normal.  She was noted to have an elevated CEA since 11/2015.  Etiology was uncertain.  Prior imaging studies in 1012 and 2013 (predating elevated CEA).  We discussed a follow-up PET scan.   She was scheduled for right hemicolectomy on 09/22/2018 for a large transverse colon polyp.  She received Prolia for her osteopenia.  She was noted to have dysphagia.  She underwent EGD by Dr Allen Norris on 10/14/2018.  She had a small hiatal hernia and benign-appearing esophageal stenosis which was dilated.  Barium swallow on 10/28/2018 revealed a mildly dilated, patulous esophagus.  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  She was admitted to [facility] from 11/06/2018 - 11/09/2018 for right colectomy.  She underwent right laparoscopic colectomy on 11/06/2018 by Dr Caroleen Hamman.  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  Margins were free or free of dysplasia.  25 lymph nodes were negative for malignancy.  She was admitted to [facility] from 12/25/2018 - 12/31/2018 following a mechanical fall.  Imaging studies revealed multiple right posterior rib fractures and a 5% pneumothorax.  Pain was controlled.  Follow-up CXR revealed stable to  decrease size in her pneumothorax.  She was discharged home with physical therapy.  She was admitted to [facility] from 03/20/2019 - 02/03/2021with acute respiratory failure with hypoxia and hypercapnea.  She required mechanical ventilation in the ICU. She was diagnosed with MRSA pneumonia.  After transition to the medical unit, she experienced loss of consciousness and recurrent hypercapnia.  She was felt to need home ventilator support at night.  She was discharged with Trilogy ventilator.  She was sen in follow-up by Dr Mortimer Fries on 05/12/2019.  Strength was improving.  She still required the Trilogy ventilator.She was referred to Dr Tami Ribas, ENT, for trach change with a cuff fenestrated trach.  Bilateral screening mammogram on 04/08/2019 revealed no evidence of malignancy.  During the interim,    Past Medical History:  Diagnosis Date  . Breast cancer ([place]) 2002   right breast chemo and radiation  . Cancer [facility]) 2002   right breast  . Diabetes mellitus without complication ([place])   . Diabetic peripheral neuropathy ([place]) 08/28/2016  . GERD (gastroesophageal reflux disease)   . Glaucoma   . High cholesterol   . History of colon polyps   . Peripheral neuropathy 08/28/2016  . Personal history of chemotherapy 2002   BREAST CA  . Personal history of malignant neoplasm of breast   . Personal history of radiation therapy 2002   BREAST CA  . Vocal cord paralysis 2012    Past Surgical History:  Procedure Laterality Date  . ABDOMINAL HYSTERECTOMY    . BREAST BIOPSY Right 2008   neg  . BREAST EXCISIONAL BIOPSY Right 2002   positive  . BREAST  LUMPECTOMY Right 2002   chemo and radiation  . BREAST SURGERY Right 2002   LUMPECTOMY, radiation, chemotherapy  . CHOLECYSTECTOMY N/A 12/10/2017   Procedure: LAPAROSCOPIC CHOLECYSTECTOMY;  Surgeon: Benjamine Sprague, DO;  Location: [facility] ORS;  Service: General;  Laterality: N/A;  . COLON RESECTION Right 11/06/2018   Procedure: HAND ASSISTED LAPAROSCOPIC COLON  RESECTION, RIGHT;  Surgeon: Jules Husbands, MD;  Location: [facility] ORS;  Service: General;  Laterality: Right;  . COLONOSCOPY  2009,12/30/2013  . COLONOSCOPY WITH PROPOFOL N/A 04/16/2018   Tubulovillous polyp proximal transverse colon, large sessile polyp, proximal transverse colon;  Surgeon: Robert Bellow, MD;  Location: [facility] ENDOSCOPY;  Service: Endoscopy;  Laterality: N/A;  . ESOPHAGOGASTRODUODENOSCOPY (EGD) WITH PROPOFOL N/A 10/14/2018   Procedure: ESOPHAGOGASTRODUODENOSCOPY (EGD) WITH PROPOFOL;  Surgeon: Lucilla Lame, MD;  Location: [facility] ENDOSCOPY;  Service: Endoscopy;  Laterality: N/A;  . HYDROCELE EXCISION / REPAIR    . TRACHEAL SURGERY  2013  . TRACHEOSTOMY N/A    Dr Kathyrn Sheriff    Family History  Problem Relation Age of Onset  . Cancer Father        bone cancer  . Cancer Sister        lymphoma  . Cancer Brother   . Breast cancer Neg Hx     Social History:  reports that she has never smoked. She has never used smokeless tobacco. She reports that she does not drink alcohol or use drugs. She denies any smoking. She worked in [employer] (medical) for 26 years. She is married and her husband's name is [name].  She lives in [place].  The patient is alone today.  Allergies:  Allergies  Allergen Reactions  . Shellfish Allergy Shortness Of Breath    respitatory distress   . Sodium Sulfite Shortness Of Breath    Severe Congestion that creates inability to breath  . Codeine Other (See Comments)    Per patient "it kept her up all night"    Current Medications: Current Outpatient Medications  Medication Sig Dispense Refill  . acetaminophen (TYLENOL) 325 MG tablet Take 2 tablets (650 mg total) by mouth every 6 (six) hours as needed for moderate pain or fever. 30 tablet 0  . Biotin 1000 MCG tablet Take 1,000 mcg by mouth daily.     . busPIRone (BUSPAR) 15 MG tablet Take 1 tablet (15 mg total) by mouth 2 (two) times daily. 60 tablet 0  . Calcium Carbonate-Vitamin D 600-400 MG-UNIT tablet Take 1  tablet by mouth daily.     Marland Kitchen escitalopram (LEXAPRO) 20 MG tablet Take 20 mg by mouth daily.    . feeding supplement, ENSURE ENLIVE, (ENSURE ENLIVE) LIQD Take 237 mLs by mouth 2 (two) times daily between meals. 761 mL 12  . folic acid (FOLVITE) 1 MG tablet Take 1 mg by mouth daily.    Marland Kitchen gabapentin (NEURONTIN) 400 MG capsule Take 1 capsule (400 mg total) by mouth 3 (three) times daily. 90 capsule 0  . ipratropium-albuterol (DUONEB) 0.5-2.5 (3) MG/3ML SOLN Take 3 mLs by nebulization every 6 (six) hours as needed. 360 mL 0  . iron polysaccharides (FERREX 150) 150 MG capsule Take by mouth.    . lovastatin (MEVACOR) 20 MG tablet Take 20 mg by mouth at bedtime.    . meclizine (ANTIVERT) 25 MG tablet Take 25 mg by mouth 2 (two) times daily as needed for dizziness.     . Melatonin 5 MG TABS Take 1 tablet (5 mg total) by mouth at bedtime. 30 tablet 0  .  metFORMIN (GLUCOPHAGE) 1000 MG tablet Take 1,000 mg by mouth 2 (two) times daily as needed (for BS >110 in the morning and >150 in the evening).     . methocarbamol (ROBAXIN) 500 MG tablet Take 1 tablet (500 mg total) by mouth every 8 (eight) hours as needed for muscle spasms. 30 tablet 0  . metoprolol tartrate (LOPRESSOR) 25 MG tablet Take 1 tablet (25 mg total) by mouth 2 (two) times daily. 60 tablet 0  . Nebulizers (COMPRESSOR/NEBULIZER) MISC Nebulizer with supplies, trach collar for med admin. 1 each 0  . ondansetron (ZOFRAN) 4 MG tablet Take 1 tablet (4 mg total) by mouth every 8 (eight) hours as needed for nausea or vomiting. 20 tablet 0  . pantoprazole (PROTONIX) 40 MG tablet Take 40 mg by mouth daily.     . RESTASIS 0.05 % ophthalmic emulsion Place 1 drop into both eyes 2 (two) times daily.     Marland Kitchen rOPINIRole (REQUIP) 0.5 MG tablet Take 0.5 mg by mouth at bedtime.      No current facility-administered medications for this visit.    Review of Systems  Constitutional: Negative.  Negative for chills, diaphoresis, fever, malaise/fatigue and weight  loss.  HENT: Negative.  Negative for congestion, hearing loss, sinus pain and sore throat.        S/p tracheostomy   Eyes: Negative.  Negative for blurred vision.  Respiratory: Positive for shortness of breath (secondary to tracheostomy). Negative for cough and wheezing.   Cardiovascular: Negative.  Negative for chest pain, palpitations, orthopnea, leg swelling and PND.  Gastrointestinal: Negative.  Negative for abdominal pain, blood in stool, constipation, diarrhea, melena, nausea and vomiting.       Dysphagia.  Genitourinary: Positive for dysuria, frequency and urgency. Negative for hematuria.       Nocturia.  Musculoskeletal: Positive for back pain (degenerative discs) and falls. Negative for joint pain and myalgias.  Skin: Negative.  Negative for rash.  Neurological: Negative.  Negative for dizziness, tingling, sensory change, focal weakness, weakness and headaches.  Endo/Heme/Allergies: Negative.  Does not bruise/bleed easily.  Psychiatric/Behavioral: Negative.  Negative for depression, memory loss and substance abuse. The patient is not nervous/anxious and does not have insomnia.   All other systems reviewed and are negative.   Performance status (ECOG): 1  Vital Signs There were no vitals taken for this visit.   Physical Exam  Constitutional: She is oriented to person, place, and time. She appears well-developed and well-nourished. No distress.  HENT:  Head: Normocephalic and atraumatic.  Mouth/Throat: Oropharynx is clear and moist. No oropharyngeal exudate.  Short white hair. Mask.   Eyes: Pupils are equal, round, and reactive to light. Conjunctivae and EOM are normal. No scleral icterus.  Glasses.   Neck:  Tracheostomy.  Cardiovascular: Normal rate, regular rhythm and normal heart sounds.  No murmur heard. Pulmonary/Chest: Effort normal and breath sounds normal. No respiratory distress. She has no wheezes. Right breast exhibits tenderness. Right breast exhibits no inverted  nipple, no mass and no nipple discharge. Skin change: post-operative changes from 9 to 1 o'clock  Left breast exhibits tenderness. Left breast exhibits no inverted nipple, no mass and no nipple discharge. Skin change: fibrocystic changes 1 o'clock.  Abdominal: Soft. Bowel sounds are normal. She exhibits no distension and no mass. There is no abdominal tenderness. There is no rebound and no guarding.  Musculoskeletal:        General: No edema. Normal range of motion.     Cervical back: Normal range  of motion and neck supple.  Lymphadenopathy:    She has no cervical adenopathy.    She has no axillary adenopathy.       Right: No supraclavicular adenopathy present.       Left: No supraclavicular adenopathy present.  Neurological: She is alert and oriented to person, place, and time. Gait (unsteady) abnormal.  Skin: Skin is warm and dry. She is not diaphoretic. No erythema.  Psychiatric: She has a normal mood and affect. Her behavior is normal. Judgment and thought content normal.  Nursing note and vitals reviewed.   No visits with results within 3 Day(s) from this visit.  Latest known visit with results is:  No results displayed because visit has over 200 results.      Assessment:  Angela David is a 79 y.o. female with stage II invasive right breast cancer s/p lumpectomy with sentinel lymph node biopsy on 08/14/2000.  Pathology revealed a 1.0 to 1.5 cm grade II IDC.  There was evidence of lymphatic invasion.  Margins were clear.  Tumor was ER-, PR+, and Her2/neu -. Subsequent SLN and axillary dissection confirmed metastatic carcinoma in 1 lymph node (pathology unavailable).  Pathologic stage was T1cN1M0.    She received epirubicin and cyclophosphamide (EC) for 4 cycles (09/2000 - 01/2001).  She received radiation in 02/2001 - 03/2001. She received tamoxifen for five years (ended ~ 02/2006).   Bilateral mammogram on 01/29/2018 revealed no evidence of malignancy.  Bilateral screening  mammogram on 04/08/2019 revealed no evidence of malignancy.  CEA has been followed:  11.4 on 12/21/2015, 8.1 on 02/22/2016, 7.2 on 06/29/2016, 10.1 on 01/15/2017, 11.3 on 09/03/2017, 9.4 on 03/04/2018, and 9.6 on 09/03/2018.  CA27.29 has been followed:  37.5 on 12/21/2015, 31.4 on 06/30/2016, 29.5 on 01/04/2017, 24.4 on 09/03/2017, 27.8 on 03/04/2018, 27.8 on 03/04/2018, and 27.8 on 09/03/2018.  Bone density on 03/25/2014 revealed osteopenia with a T-score of -1.4 in the left femoral neck.  Bone density on 04/09/2018 revealed osteopenia with a T-score of -1.4 in the right femoral neck.  She has received Prolia every 6 months (01/18/2016 - 09/05/2018).  Colonoscopy on 04/16/2018 revealed two 6 to 12 mm polyps in the transverse colon (removed), one 45 mm polyp in the proximal transverse colon, and one 7 mm polyp at the recto-sigmoid colon (removed).  Treatment for the larger 4.5 cm polyp in the transverse colon was unsuccessful.  The lesion was tatooed.  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  There was no dysplasia or malignancy.  She is scheduled for right hemicolectomy on 09/22/2018.  She underwent right laparoscopic colectomy on 11/06/2018.  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  Margins were free or free of dysplasia.  25 lymph nodes were negative for malignancy.  She is s/p tracheostomy for tracheal stenosis.  She was admitted to [facility] from 03/20/2019 - 02/03/2021with acute respiratory failure with hypoxia and hypercapnea.  She required mechanical ventilation in the ICU. She was diagnosed with MRSA pneumonia.  After transition to the medical unit, she experienced loss of consciousness and recurrent hypercapnia.  She was felt to need home ventilator support at night.  She was discharged with Trilogy ventilator.  Symptomatically,    Plan: 1.   Labs today:  CBC with diff, CMP, CEA, CA27.29.   2.   Stage II right breast cancer  Review entire medical history, diagnosis and management of breast cancer.  She has undergone lumpectomy, adjuvant chemotherapy, radiation, and 5 years of tamoxifen.  Mammogram on 01/29/2018 revealed no evidence of disease.  Clinically, she is doing well without evidence of recurrence. 3.   Elevated CEA  Patient has had an elevated CEA since 11/2015.  Etiology is uncertain.  Discuss prior imaging studies in 1012 and 2013 (predating elevated CEA).  Discuss PET scan. 4.   Osteopenia  Review prior bone density studies.  Prolia las 09/05/2018. 5.   Transverse colon polyp  Patient scheduled for right hemicolectomy on 09/22/2018. 6.   UTI symptoms  UA and culture today. 7.   RTC in 6 months for MD assessment, labs (CBC with diff, CMP, CA27.29, CEA), and Prolia.  I discussed the assessment and treatment plan with the patient.  The patient was provided an opportunity to ask questions and all were answered.  The patient agreed with the plan and demonstrated an understanding of the instructions.  The patient was advised to call back if the symptoms worsen or if the condition fails to improve as anticipated.  I provided 30 minutes of face-to-face time during this this encounter and > 50% was spent counseling as documented under my assessment and plan.    Lequita Asal, MD, PhD    05/14/2019, 5:48 AM  I, Molly Dorshimer, am acting as Education administrator for [employer]. Mike Gip, MD, PhD.  I, Karisa Nesser C. Mike Gip, MD, have reviewed the above documentation for accuracy and completeness, and I agree with the above.

## 2019-05-17 NOTE — Progress Notes (Signed)
[facility]  [address], [address] [place], [place] [address] Phone: [phone]  Fax: [phone]   Clinic Day:  05/18/2019  Referring physician: Iva Boop*  Chief Complaint: Angela David is a 79 y.o. 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   HPI: The patient was last seen in the medical oncology clinic on 09/05/2018 as a new patient. At that time, she felt "ok".  She felt unsteady on her feet; she is going to physical therapy with noted improvement. Exam revealed post-operative changes. We discussed a PET scan. Urine culture was positive for E.coli.  She was treated with ciprofloxacin 250 mg po BID x 3-5 days. Macrobid 100 mg BID x 5 days. Septra BID x 3 days and Augmentin 500 mg BID x 5-7 days. She received Prolia.   PET scan on 09/16/2018 revealed no evidence of metabolically active malignancy or metastatic disease.   She had a consultation with Dr. Allen Norris on 10/08/2018. The patient had lost approximately 8 pounds in the last few weeks due to the dysphasia. The patient now reported that she had been having problems with swallowing; it is mostly with water. 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   EGD by Dr. Allen Norris on 10/14/2018 showed a small hiatal hernia. There was benign-appearing esophageal stenosis, dilated. There were multiple gastric polyps. Gastritis was biopsied. Duodenum was normal. Pathology showed gastric oxyntic mucosa with mild chronic inactive gastritis. Negative for H. Pylori, dysplasia, and malignancy.   Barium swallow study and esophogram ultrasound on 10/28/2018 showed mildly dilated, patulous esophagus. 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 Small hiatal hernia.  Patient underwent a laparoscopic right hemicolectomy with stapled ileocolostomy with Dr. Dahlia Byes on 11/06/2018.   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 There were 25 lymph nodes negative for malignancy. Surgical margins were free of of dysplasia.   She had a follow up with Dr. Dahlia Byes on 11/24/2018. She was doing well.  She would follow up with GI for further colonoscopies. She would follow up with Dr. Dahlia Byes in 3 months for a mammogram.   Patient was admitted to [facility] from 12/25/2018 - 01/10/2019 for pain and pulmonary management. She reported a mechanical fall.  She denied a head injury or loss of consciousness. Since that time,she had severe pain in the right side of her chest, worse with deep inspiration. Work up in the ER was concerning for multiple posterior right rib fractures and a 5% pneumothorax.  During admission. her pain was controlled with multimodal pain management. CXR on 12/26/2018 showed stable to decrease in size of right apical PTX.The remainder of patient's hospital course was unremarkable. She was evaluated by PT and recommended SNF vs HHPT; the patient preferred home health PT. Discharge planning was initiated accordingly with patient safely able to be discharged home with appropriate discharge instructions, pain control, and outpatient follow-up with Dr. Dahlia Byes.  Patient was seen by Dr. Dahlia Byes on 01/07/2019. She had a tracheostomy and was doing very well.  She continued to endorse pain that was moderate in intensity intermittent and worsening with deep breaths.  She denied any fevers, chills, and no changes in respiratory status. CXR revealed no evidence of pneumothorax. There was significant atelectasis within the right side. Plan was for a follow up CXR in 2-3 weeks.   CXR on 02/03/2019 showed stable appearance of  the chest. There was no evidence for acute cardiopulmonary abnormality. There were multiple acute RIGHT rib fractures, involving ribs 4 - 10.  She had a follow up with Dr. Dahlia Byes on 03/04/2019. She reported  shoulder and back pain. She denied any fever, chills, nausea, emesis, chest pain or shortness of breath. Overall, she was doing well.CXR showed no acute cardiopulmonary disease. There was chronic moderate elevation of the right hemidiaphragm. She had a well-positioned tracheostomy tube.  She was admitted to [facility] from 03/20/2019 - 04/01/2019. She was diagnosed with MRSA pneumonia. Her husband noted she was sleeping all the time and could not keep her head up and stay awake. Head CT on 03/20/2019 showed no evidence for acute intracranial abnormality.  There was atrophy and presumed chronic small vessel ischemic change of the white matter. Chest CT angiogram on 03/20/2019 showed no pulmonary embolus.  There was a dilated fluid-filled esophagus from the thoracic inlet to the gastroesophageal junction, may be reflux or dysmotility. Achalasia was also considered.  There was mild dependent opacities in both lower lobes, likely atelectasis. Given esophageal dilatation, aspiration was also considered. There were subacute right rib fractures. There was some interval incomplete fracture healing since 11/2018 injury. There was stable right upper lobe scarring and benign pulmonary nodules. Echo on 03/24/2019 showed an EF of 70-75%.  During admission,  patient was in ICU. Patient was found to be hypercapnia requiring mechanical ventilation. She had an episode of loss of consciousness due to recurrent issues with hypercapnia. She was felt to need at the very least nighttime ventilation.  Patient placed on mechanical ventilation at night and off during the day. She was able to eat by mouth. Upon discharged patient was referred to DME for Trilogy home ventilator. Her husband and niece were trained on Trilogy vent.   Bilateral mammogram on 04/08/2019 showed no evidence of malignancy.   Patient had a follow up with Dr. Dahlia Byes on 04/22/2019. She denied any specific concerns for her breast.  She stated that there was a spot on  the left upper thigh that was tender and she thought it was a mass. She was on ventilatory support at night. She was wearing a nonfenestrated trach and was unable to speak but was able to write words. Patient will follow up in a few weeks.   She was seen in pulmonology by Dr. Mortimer Fries on 05/12/2019. She reported getting her strength back. She was eating well. Patient had a cuffed nonfenestrated trach. She had no symptoms or signs of infection. Patient relied on trilogy ventilator therapy. She was unable to vocalize and would need a referral to ENT Dr. Tami Ribas to help assist with trach changes with a cuffed fenestrated trach.   During the interim, she has been doing "ok". The patient's husband did most of the talking for the patient. She notes a spot on her inner leg. She notes s/p right hemicolectomy the incision site feels lumpy. Her shortness of breath feels better. Her husband notes she has had several UTIs that were treated with Cipro. Her husband notes that she is taking hormonal injections which could be related to multiple UTIs. She is taking Tylenol and using heating pains for upper back pain. She has no breast concerns. She performs monthly breast exams.   The patient wants to be able to have a device in her trach to talk. Patient will not be seen by Dr. Anda Latina. She notes that she feels depressed because she can not talk or go anywhere. They will  try to go to [facility] to see someone and resolve this issue.  Patient's BP is 94/45 in the clinic today. Patient is not orthostatic. She denies any dizziness or lightheadedness. Her husband notes she has chronic low blood pressure. She is on a nightly ventilator without oxygen. Her oxygen saturations run about about 97-98%. He notes her lungs have become better.   Patient agreed to Prolia today. Patient would like to be seen in the clinic every 6 months.    Past Medical History:  Diagnosis Date  . Breast cancer ([place]) 2002   right breast chemo and  radiation  . Cancer [facility]) 2002   right breast  . Diabetes mellitus without complication ([place])   . Diabetic peripheral neuropathy ([place]) 08/28/2016  . GERD (gastroesophageal reflux disease)   . Glaucoma   . High cholesterol   . History of colon polyps   . Peripheral neuropathy 08/28/2016  . Personal history of chemotherapy 2002   BREAST CA  . Personal history of malignant neoplasm of breast   . Personal history of radiation therapy 2002   BREAST CA  . Vocal cord paralysis 2012    Past Surgical History:  Procedure Laterality Date  . ABDOMINAL HYSTERECTOMY    . BREAST BIOPSY Right 2008   neg  . BREAST EXCISIONAL BIOPSY Right 2002   positive  . BREAST LUMPECTOMY Right 2002   chemo and radiation  . BREAST SURGERY Right 2002   LUMPECTOMY, radiation, chemotherapy  . CHOLECYSTECTOMY N/A 12/10/2017   Procedure: LAPAROSCOPIC CHOLECYSTECTOMY;  Surgeon: Benjamine Sprague, DO;  Location: [facility] ORS;  Service: General;  Laterality: N/A;  . COLON RESECTION Right 11/06/2018   Procedure: HAND ASSISTED LAPAROSCOPIC COLON RESECTION, RIGHT;  Surgeon: Jules Husbands, MD;  Location: [facility] ORS;  Service: General;  Laterality: Right;  . COLONOSCOPY  2009,12/30/2013  . COLONOSCOPY WITH PROPOFOL N/A 04/16/2018   Tubulovillous polyp proximal transverse colon, large sessile polyp, proximal transverse colon;  Surgeon: Robert Bellow, MD;  Location: [facility] ENDOSCOPY;  Service: Endoscopy;  Laterality: N/A;  . ESOPHAGOGASTRODUODENOSCOPY (EGD) WITH PROPOFOL N/A 10/14/2018   Procedure: ESOPHAGOGASTRODUODENOSCOPY (EGD) WITH PROPOFOL;  Surgeon: Lucilla Lame, MD;  Location: [facility] ENDOSCOPY;  Service: Endoscopy;  Laterality: N/A;  . HYDROCELE EXCISION / REPAIR    . TRACHEAL SURGERY  2013  . TRACHEOSTOMY N/A    Dr Kathyrn Sheriff    Family History  Problem Relation Age of Onset  . Cancer Father        bone cancer  . Cancer Sister        lymphoma  . Cancer Brother   . Breast cancer Neg Hx     Social History:  reports that she  has never smoked. She has never used smokeless tobacco. She reports that she does not drink alcohol or use drugs.  She denies any smoking. She worked in [employer] (medical) for 26 years. She is married and her husband's name is [name].  She lives in [place]. The patient is accompanied [name] today.  Allergies:  Allergies  Allergen Reactions  . Shellfish Allergy Shortness Of Breath    respitatory distress   . Sodium Sulfite Shortness Of Breath    Severe Congestion that creates inability to breath  . Codeine Other (See Comments)    Per patient "it kept her up all night"    Current Medications: Current Outpatient Medications  Medication Sig Dispense Refill  . acetaminophen (TYLENOL) 325 MG tablet Take 2 tablets (650 mg total) by mouth every 6 (six) hours as needed  for moderate pain or fever. 30 tablet 0  . Biotin 1000 MCG tablet Take 1,000 mcg by mouth daily.     . busPIRone (BUSPAR) 15 MG tablet Take 1 tablet (15 mg total) by mouth 2 (two) times daily. 60 tablet 0  . Calcium Carbonate-Vitamin D 600-400 MG-UNIT tablet Take 1 tablet by mouth daily.     Marland Kitchen escitalopram (LEXAPRO) 20 MG tablet Take 20 mg by mouth daily.    . feeding supplement, ENSURE ENLIVE, (ENSURE ENLIVE) LIQD Take 237 mLs by mouth 2 (two) times daily between meals. 237 mL 12  . fluconazole (DIFLUCAN) 150 MG tablet Take 150 mg by mouth once a week.     . folic acid (FOLVITE) 1 MG tablet Take 1 mg by mouth daily.    Marland Kitchen gabapentin (NEURONTIN) 400 MG capsule Take 1 capsule (400 mg total) by mouth 3 (three) times daily. 90 capsule 0  . ipratropium-albuterol (DUONEB) 0.5-2.5 (3) MG/3ML SOLN Take 3 mLs by nebulization every 6 (six) hours as needed. 360 mL 0  . lovastatin (MEVACOR) 20 MG tablet Take 20 mg by mouth at bedtime.    . meclizine (ANTIVERT) 25 MG tablet Take 25 mg by mouth 2 (two) times daily as needed for dizziness.     . Melatonin 5 MG TABS Take 1 tablet (5 mg total) by mouth at bedtime. 30 tablet 0  . metFORMIN (GLUCOPHAGE) 1000  MG tablet Take 1,000 mg by mouth 2 (two) times daily as needed (for BS >110 in the morning and >150 in the evening).     . metoprolol tartrate (LOPRESSOR) 25 MG tablet Take 1 tablet (25 mg total) by mouth 2 (two) times daily. (Patient taking differently: Take 25 mg by mouth daily. ) 60 tablet 0  . Nebulizers (COMPRESSOR/NEBULIZER) MISC Nebulizer with supplies, trach collar for med admin. 1 each 0  . ondansetron (ZOFRAN) 4 MG tablet Take 1 tablet (4 mg total) by mouth every 8 (eight) hours as needed for nausea or vomiting. 20 tablet 0  . pantoprazole (PROTONIX) 40 MG tablet Take 40 mg by mouth daily.     . RESTASIS 0.05 % ophthalmic emulsion Place 1 drop into both eyes 2 (two) times daily.     Marland Kitchen rOPINIRole (REQUIP) 0.5 MG tablet Take 0.5 mg by mouth at bedtime.     . iron polysaccharides (FERREX 150) 150 MG capsule Take 150 mg by mouth daily.      No current facility-administered medications for this visit.    Review of Systems  Constitutional: Positive for weight loss (10 lbs since 09/05/2018). Negative for chills, diaphoresis, fever and malaise/fatigue.       Doing ok.  HENT: Negative for congestion, ear discharge, ear pain, hearing loss, nosebleeds, sinus pain and sore throat.        S/p tracheostomy.  Eyes: Negative for blurred vision.  Respiratory: Positive for shortness of breath (secondary to tracheostomy; better). Negative for cough, hemoptysis and sputum production.        On oxygen. On nightly ventilator without oxygen.  Cardiovascular: Negative for chest pain, palpitations and leg swelling.  Gastrointestinal: Negative for abdominal pain, blood in stool, constipation, diarrhea, heartburn, melena, nausea and vomiting.       Dysphagia.   Genitourinary: Negative for dysuria, frequency, hematuria and urgency.       Several UTIs treated with Cipro.  Musculoskeletal: Positive for back pain (degenerative disc; upper back). Negative for falls, joint pain, myalgias and neck pain.  Skin:  Negative for itching  and rash.       Spot on inner leg. Incision site feels lumpy s/p right hemicolectomy.  Neurological: Negative for dizziness, tingling, tremors, sensory change, speech change, weakness and headaches.  Endo/Heme/Allergies: Does not bruise/bleed easily.  Psychiatric/Behavioral: Positive for depression (can not talk due to trach issues). Negative for memory loss. The patient is not nervous/anxious and does not have insomnia.   All other systems reviewed and are negative.   Performance status (ECOG): 1-2  Vitals Blood pressure (!) 94/45, pulse 87, temperature 98.5 F (36.9 C), temperature source Tympanic, resp. rate 18, weight 148 lb 0.6 oz (67.1 kg), SpO2 96 %.   Physical Exam  Constitutional: She is oriented to person, place, and time. She appears well-developed and well-nourished. No distress.  HENT:  Head: Atraumatic.  Mouth/Throat: Oropharynx is clear and moist. No oropharyngeal exudate.  Short white hair. Mask.  Eyes: Pupils are equal, round, and reactive to light. Conjunctivae and EOM are normal. No scleral icterus.  Glasses.  Neck:  Tracheostomy.  Cardiovascular: Normal rate, regular rhythm and normal heart sounds.  No murmur heard. Pulmonary/Chest: Effort normal and breath sounds normal. No respiratory distress. She has no wheezes. She has no rales. She exhibits no tenderness. Right breast exhibits skin change (scarring (2-3 cm) above and below the nipple ). Right breast exhibits no inverted nipple, no mass, no nipple discharge and no tenderness. Left breast exhibits skin change (fibrocystic changes superiorly on the left). Left breast exhibits no inverted nipple, no mass, no nipple discharge and no tenderness.  Abdominal: Soft. Bowel sounds are normal. She exhibits no distension and no mass. There is no abdominal tenderness. There is no rebound and no guarding. A hernia (incisional) is present.  Weakness at the incision site s/p right hemicolectomy.    Musculoskeletal:        General: No tenderness or edema. Normal range of motion.     Cervical back: Normal range of motion and neck supple.  Lymphadenopathy:    She has no cervical adenopathy.    She has no axillary adenopathy.       Right: No supraclavicular adenopathy present.       Left: No supraclavicular adenopathy present.  Neurological: She is alert and oriented to person, place, and time.  Skin: Skin is warm and dry. She is not diaphoretic.  Psychiatric: She has a normal mood and affect. Her behavior is normal. Judgment and thought content normal.  Nursing note and vitals reviewed.   Appointment on 05/18/2019  Component Date Value Ref Range Status  . WBC 05/18/2019 7.9  4.0 - 10.5 K/uL Final  . RBC 05/18/2019 4.55  3.87 - 5.11 MIL/uL Final  . Hemoglobin 05/18/2019 11.6* 12.0 - 15.0 g/dL Final  . HCT 05/18/2019 37.6  36.0 - 46.0 % Final  . MCV 05/18/2019 82.6  80.0 - 100.0 fL Final  . MCH 05/18/2019 25.5* 26.0 - 34.0 pg Final  . MCHC 05/18/2019 30.9  30.0 - 36.0 g/dL Final  . RDW 05/18/2019 13.8  11.5 - 15.5 % Final  . Platelets 05/18/2019 231  150 - 400 K/uL Final  . nRBC 05/18/2019 0.0  0.0 - 0.2 % Final  . Neutrophils Relative % 05/18/2019 70  % Final  . Neutro Abs 05/18/2019 5.5  1.7 - 7.7 K/uL Final  . Lymphocytes Relative 05/18/2019 22  % Final  . Lymphs Abs 05/18/2019 1.7  0.7 - 4.0 K/uL Final  . Monocytes Relative 05/18/2019 5  % Final  . Monocytes Absolute 05/18/2019  0.4  0.1 - 1.0 K/uL Final  . Eosinophils Relative 05/18/2019 1  % Final  . Eosinophils Absolute 05/18/2019 0.1  0.0 - 0.5 K/uL Final  . Basophils Relative 05/18/2019 1  % Final  . Basophils Absolute 05/18/2019 0.0  0.0 - 0.1 K/uL Final  . Immature Granulocytes 05/18/2019 1  % Final  . Abs Immature Granulocytes 05/18/2019 0.04  0.00 - 0.07 K/uL Final   Performed at [facility], [address], [place], [place] [address]    Assessment:  Angela David is a 79 y.o. female with  stage II invasive right breast cancer s/p lumpectomy with sentinel lymph node biopsy on 08/14/2000.  Pathology revealed a 1.0 to 1.5 cm grade II IDC.  There was evidence of lymphatic invasion.  Margins were clear.  Tumor was ER-, PR+, and Her2/neu -. Subsequent SLN and axillary dissection confirmed metastatic carcinoma in 1 lymph node (pathology unavailable).  Pathologic stage was T1cN1M0.    She received epirubicin and cyclophosphamide (EC) for 4 cycles (09/2000 - 01/2001).  She received radiation in 02/2001 - 03/2001. She received tamoxifen for five years (ended ~ 02/2006).   Bilateral mammogram on 01/29/2018 revealed no evidence of malignancy.  Bilateral screening mammogram on 04/08/2019 showed no evidence of malignancy.   CEA has been followed:  11.4 on 12/21/2015, 8.1 on 02/22/2016, 7.2 on 06/29/2016, 10.1 on 01/15/2017, 11.3 on 09/03/2017, 9.4 on 03/04/2018, 9.6 on 09/03/2018, and 11.4 on 05/18/2019.  CA27.29 has been followed:  37.5 on 12/21/2015, 31.4 on 06/30/2016, 29.5 on 01/04/2017, 24.4 on 09/03/2017, 27.8 on 03/04/2018, 27.8 on 03/04/2018, and 32.7 on 05/18/2019.  PET scan on 09/16/2018 revealed no evidence of metabolically active malignancy or metastatic disease.   Bone density on 03/25/2014 revealed osteopenia with a T-score of -1.4 in the left femoral neck.  Bone density on 04/09/2018 revealed osteopenia with a T-score of -1.4 in the right femoral neck.  She has received Prolia every 6 months (01/18/2016 - 03/04/2018).  Colonoscopy on 04/16/2018 revealed two 6 to 12 mm polyps in the transverse colon (removed), one 45 mm polyp in the proximal transverse colon, and one 7 mm polyp at the recto-sigmoid colon (removed).  Treatment for the larger 4.5 cm polyp in the transverse colon was unsuccessful.  The lesion was tatooed.  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  There was no dysplasia or  malignancy.    She underwent a laparoscopic right hemicolectomy with stapled ileocolostomy on 11/06/2018.  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 There were 25 lymph nodes negative for malignancy. Surgical margins were free of of dysplasia  She is s/p tracheostomy for tracheal stenosis.  Symptomatically, she denies any breast concerns.  She is on a ventilator at night.  Plan: 1.   Labs today: CBC with diff, CMP, CA 27.29, CEA. 2.   Stage II right breast cancer             Clinically, she appears to be doing well.  Exam reveals post-operative changes.              She is s/p lumpectomy, adjuvant chemotherapy, radiation, and 5 years of tamoxifen.             Bilateral screening mammogram on 04/08/2019 showed no evidence of malignancy.              Continue to monitor. 3.   Elevated  CEA             Patient has had an elevated CEA since 11/2015.   CEA has ranged between 7.2 - 11.4.             Etiology remains uncertain.             PET scan on 09/16/2018 revealed no evidence of metabolically active disease.  Anticipate follow-up imaging if CEA increases.  Continue to monitor. 4.   Osteopenia             Bone density on 04/09/2018 revealed osteopenia.             She receives Prolia every 6 months.  Prolia today. 5.   Transverse colon polyp             Patient is s/p right laparoscopic right hemicolectomy on 11/06/2018.  Pathology reviewed- tubular adenoma. 6.   RTC in 6 months for MD assessment and labs (CBC with diff, CMP, CA27.29, CEA).  I discussed the assessment and treatment plan with the patient.  The patient was provided an opportunity to ask questions and all were answered.  The patient agreed with the plan and demonstrated an understanding of the instructions.  The patient was advised to call back if the symptoms worsen or if the condition fails to improve as anticipated.  I provided 34 minutes of  face-to-face time during this encounter and > 50% was spent counseling as documented under my assessment and plan.  An additional 20 minutes were spent reviewing her chart (Epic and Care Everywhere) including notes, labs, and imaging studies.    Lequita Asal, MD, PhD    05/18/2019, 2:10 PM  I, Selena Batten, am acting as scribe for [employer]. Mike Gip, MD, PhD.  I, Chaynce Schafer C. Mike Gip, MD, have reviewed the above documentation for accuracy and completeness, and I agree with the above.

## 2019-05-18 ENCOUNTER — Inpatient Hospital Stay: Payer: Medicare Other | Attending: Hematology and Oncology | Admitting: Hematology and Oncology

## 2019-05-18 ENCOUNTER — Inpatient Hospital Stay: Payer: Medicare Other

## 2019-05-18 ENCOUNTER — Encounter: Payer: Self-pay | Admitting: Hematology and Oncology

## 2019-05-18 ENCOUNTER — Other Ambulatory Visit: Payer: Self-pay

## 2019-05-18 VITALS — BP 94/45 | HR 87 | Temp 98.5°F | Resp 18 | Wt 148.0 lb

## 2019-05-18 DIAGNOSIS — M85851 Other specified disorders of bone density and structure, right thigh: Secondary | ICD-10-CM

## 2019-05-18 DIAGNOSIS — R970 Elevated carcinoembryonic antigen [CEA]: Secondary | ICD-10-CM

## 2019-05-18 DIAGNOSIS — C50811 Malignant neoplasm of overlapping sites of right female breast: Secondary | ICD-10-CM | POA: Diagnosis not present

## 2019-05-18 DIAGNOSIS — Z171 Estrogen receptor negative status [ER-]: Secondary | ICD-10-CM

## 2019-05-18 DIAGNOSIS — M85852 Other specified disorders of bone density and structure, left thigh: Secondary | ICD-10-CM

## 2019-05-18 DIAGNOSIS — C50911 Malignant neoplasm of unspecified site of right female breast: Secondary | ICD-10-CM | POA: Diagnosis present

## 2019-05-18 DIAGNOSIS — C773 Secondary and unspecified malignant neoplasm of axilla and upper limb lymph nodes: Secondary | ICD-10-CM | POA: Diagnosis not present

## 2019-05-18 DIAGNOSIS — D126 Benign neoplasm of colon, unspecified: Secondary | ICD-10-CM | POA: Diagnosis not present

## 2019-05-18 LAB — CBC WITH DIFFERENTIAL/PLATELET
Abs Immature Granulocytes: 0.04 10*3/uL (ref 0.00–0.07)
Basophils Absolute: 0 10*3/uL (ref 0.0–0.1)
Basophils Relative: 1 %
Eosinophils Absolute: 0.1 10*3/uL (ref 0.0–0.5)
Eosinophils Relative: 1 %
HCT: 37.6 % (ref 36.0–46.0)
Hemoglobin: 11.6 g/dL — ABNORMAL LOW (ref 12.0–15.0)
Immature Granulocytes: 1 %
Lymphocytes Relative: 22 %
Lymphs Abs: 1.7 10*3/uL (ref 0.7–4.0)
MCH: 25.5 pg — ABNORMAL LOW (ref 26.0–34.0)
MCHC: 30.9 g/dL (ref 30.0–36.0)
MCV: 82.6 fL (ref 80.0–100.0)
Monocytes Absolute: 0.4 10*3/uL (ref 0.1–1.0)
Monocytes Relative: 5 %
Neutro Abs: 5.5 10*3/uL (ref 1.7–7.7)
Neutrophils Relative %: 70 %
Platelets: 231 10*3/uL (ref 150–400)
RBC: 4.55 MIL/uL (ref 3.87–5.11)
RDW: 13.8 % (ref 11.5–15.5)
WBC: 7.9 10*3/uL (ref 4.0–10.5)
nRBC: 0 % (ref 0.0–0.2)

## 2019-05-18 LAB — COMPREHENSIVE METABOLIC PANEL
ALT: 12 U/L (ref 0–44)
AST: 17 U/L (ref 15–41)
Albumin: 3.9 g/dL (ref 3.5–5.0)
Alkaline Phosphatase: 68 U/L (ref 38–126)
Anion gap: 10 (ref 5–15)
BUN: 19 mg/dL (ref 8–23)
CO2: 24 mmol/L (ref 22–32)
Calcium: 9.7 mg/dL (ref 8.9–10.3)
Chloride: 102 mmol/L (ref 98–111)
Creatinine, Ser: 1.11 mg/dL — ABNORMAL HIGH (ref 0.44–1.00)
GFR calc Af Amer: 56 mL/min — ABNORMAL LOW (ref >60)
GFR calc non Af Amer: 48 mL/min — ABNORMAL LOW (ref >60)
Glucose, Bld: 145 mg/dL — ABNORMAL HIGH (ref 70–99)
Potassium: 4.4 mmol/L (ref 3.5–5.1)
Sodium: 136 mmol/L (ref 135–145)
Total Bilirubin: 0.5 mg/dL (ref 0.3–1.2)
Total Protein: 7.8 g/dL (ref 6.5–8.1)

## 2019-05-18 MED ORDER — DENOSUMAB 60 MG/ML ~~LOC~~ SOSY
60.0000 mg | PREFILLED_SYRINGE | Freq: Once | SUBCUTANEOUS | Status: AC
  Administered 2019-05-18: 60 mg via SUBCUTANEOUS

## 2019-05-18 NOTE — Progress Notes (Signed)
Patient here for follow up. Reports some concerns with "lumpiness" to abdominal area from surgery in 10/2018. Surgeon is aware and does not seem concerned. Denies any other concerns.

## 2019-05-19 ENCOUNTER — Telehealth: Payer: Self-pay

## 2019-05-19 LAB — CEA: CEA: 11.4 ng/mL — ABNORMAL HIGH (ref 0.0–4.7)

## 2019-05-19 LAB — CANCER ANTIGEN 27.29: CA 27.29: 32.7 U/mL (ref 0.0–38.6)

## 2019-05-19 NOTE — Telephone Encounter (Signed)
Attempted to contact patient to inform her of her CEA level. VM left requesting callback to the office.

## 2019-05-19 NOTE — Telephone Encounter (Signed)
-----   Message from Lequita Asal, MD sent at 05/19/2019 10:33 AM EDT ----- Regarding: Please contact patient re: CEA.  ----- Message ----- From: Buel Ream, [facility] [place] Sent: 05/18/2019   1:59 PM EDT To: Lequita Asal, MD

## 2019-05-21 NOTE — Telephone Encounter (Signed)
Contacted patient's husband, Wille Glaser, and informed him of CEA level. Husband verbalizes understanding and denies any further questions or concerns.

## 2019-07-09 ENCOUNTER — Encounter: Payer: Self-pay | Admitting: *Deleted

## 2019-07-16 ENCOUNTER — Telehealth: Payer: Self-pay | Admitting: Internal Medicine

## 2019-07-16 NOTE — Telephone Encounter (Signed)
Called and spoke with patient she states that she has not worn her Trelegy machine in about a month. States that she didn't need it anymore. She said that it was working great when she was using it and then there just comes a point when you no longer need it and she doesn't need it anymore. Told patient that I would forward message to Dr. Mortimer Fries so he was aware and for her to follow up with our office around September. Patient expressed understanding. Nothing further needed at this time.

## 2019-07-20 ENCOUNTER — Emergency Department: Payer: Medicare Other

## 2019-07-20 ENCOUNTER — Other Ambulatory Visit: Payer: Self-pay

## 2019-07-20 ENCOUNTER — Emergency Department
Admission: EM | Admit: 2019-07-20 | Discharge: 2019-07-20 | Disposition: A | Discharge status: Home / Self Care | Payer: Medicare Other | Attending: Student | Admitting: Student

## 2019-07-20 DIAGNOSIS — Z7984 Long term (current) use of oral hypoglycemic drugs: Secondary | ICD-10-CM | POA: Diagnosis not present

## 2019-07-20 DIAGNOSIS — J9503 Malfunction of tracheostomy stoma: Secondary | ICD-10-CM

## 2019-07-20 DIAGNOSIS — Z853 Personal history of malignant neoplasm of breast: Secondary | ICD-10-CM | POA: Diagnosis not present

## 2019-07-20 DIAGNOSIS — E119 Type 2 diabetes mellitus without complications: Secondary | ICD-10-CM | POA: Diagnosis not present

## 2019-07-20 MED ORDER — DOXYCYCLINE HYCLATE 100 MG PO CAPS: 100.0000 mg | ORAL_CAPSULE | Freq: Two times a day (BID) | ORAL | 0 refills | Status: AC

## 2019-07-20 NOTE — ED Provider Notes (Signed)
[facility] Emergency Department Provider Note  ____________________________________________   First MD Initiated Contact with Patient 07/20/19 1705     (approximate)  I have reviewed the triage vital signs and the nursing notes.  History  Chief Complaint Trach tube problem    HPI Angela David is a 79 y.o. 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  Patient says over the last several days she has had thicker secretions than normal requiring increased suctioning/clearance.  Today she had a thick mucus plug occluding her trach.  She and her husband were unable to resolve the issue w/ suction, so they called EMS. EMS removed the entire trach itself, were able to clear the mucus plug, and replaced the trach without issue, and inflated the cuff (though she normally keeps it deflated).  However, since replacing the trach she is unable to talk like she normally does.  Typically she has no issues speaking over her trach, so this is new for her. She wonders if it could've been malpositioned when replaced, or if she is having an episode of vocal cord spasming (husband at bedside says sometimes her vocal cords will spasm and when that happens she can't talk). Denies any shortness of breath or issues breathing at present.  Reports resolution in the sensation from the mucous plug.   Past Medical Hx Past Medical History:  Diagnosis Date  . Breast cancer ([place]) 2002   right breast chemo and radiation  . Cancer [facility]) 2002   right breast  . Diabetes mellitus without complication ([place])   . Diabetic peripheral neuropathy ([place]) 08/28/2016  . GERD (gastroesophageal reflux disease)   . Glaucoma   . High cholesterol   . History of colon polyps   . Peripheral neuropathy 08/28/2016  . Personal history of  chemotherapy 2002   BREAST CA  . Personal history of malignant neoplasm of breast   . Personal history of radiation therapy 2002   BREAST CA  . Vocal cord paralysis 2012    Problem List Patient Active Problem List   Diagnosis Date Noted  . 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 Drug-induced polyneuropathy ([place]) 05/05/2019  . 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 Weakness   . Altered mental status   . Hypoxia   . Acute respiratory failure with hypoxia and hypercapnia ([place]) 03/21/2019  . Hypotension 03/21/2019  . Acute encephalopathy 03/21/2019  . Multiple fractures of ribs, right side, initial encounter for closed fracture 12/25/2018  . Colon polyp 11/06/2018  . Esophageal dysphagia   . 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 Stricture and stenosis of esophagus   . Elevated CEA 09/02/2018  . Goals of care, counseling/discussion 09/02/2018  . Polyp of transverse colon 09/02/2018  . Foreign body in right foot 05/26/2018  . History of tracheostomy 04/30/2018  . Moderate episode of recurrent major depressive disorder ([place]) 04/03/2018  . Poor balance 04/03/2018  . Shoulder lesion, right 02/28/2017  . Cervical radiculopathy 09/28/2016  . Peripheral neuropathy 08/28/2016  . Diabetic peripheral neuropathy ([place]) 08/28/2016  . Leg weakness, bilateral 08/13/2016  . Gait abnormality 08/13/2016  . Carcinoma of overlapping sites of right breast in female, estrogen receptor negative ([place]) 12/27/2015  . Malignant neoplasm of breast ([place]) 03/28/2015  . 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 Hypercholesterolemia 03/28/2015  .  Recurrent major depressive disorder, in partial remission ([place]) 03/01/2015  . 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 Cephalalgia 09/15/2014  . Headache due to trauma 09/15/2014  . Osteopenia 01/07/2014  . Age-related osteoporosis without current pathological fracture 01/07/2014  . History of colonic polyps  12/14/2013  . History of breast cancer 11/26/2012  . H/O malignant neoplasm of breast 11/26/2012  . 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 Tracheostomy present ([place]) 12/06/2011  . Calcification of bronchus or trachea 01/16/2011  . 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 Anxiety state 12/19/2010  . Blood in sputum 12/19/2010  . HLD (hyperlipidemia) 12/19/2010  . Type 2 diabetes mellitus ([place]) 12/19/2010    Past Surgical Hx Past Surgical History:  Procedure Laterality Date  . ABDOMINAL HYSTERECTOMY    . BREAST BIOPSY Right 2008   neg  . BREAST EXCISIONAL BIOPSY Right 2002   positive  . BREAST LUMPECTOMY Right 2002   chemo and radiation  . BREAST SURGERY Right 2002   LUMPECTOMY, radiation, chemotherapy  . CHOLECYSTECTOMY N/A 12/10/2017   Procedure: LAPAROSCOPIC CHOLECYSTECTOMY;  Surgeon: Benjamine Sprague, DO;  Location: [facility] ORS;  Service: General;  Laterality: N/A;  . COLON RESECTION Right 11/06/2018   Procedure: HAND ASSISTED LAPAROSCOPIC COLON RESECTION, RIGHT;  Surgeon: Jules Husbands, MD;  Location: [facility] ORS;  Service: General;  Laterality: Right;  . COLONOSCOPY  2009,12/30/2013  . COLONOSCOPY WITH PROPOFOL N/A 04/16/2018   Tubulovillous polyp proximal transverse colon, large sessile polyp, proximal transverse colon;  Surgeon: Robert Bellow, MD;  Location: [facility] ENDOSCOPY;  Service: Endoscopy;  Laterality: N/A;  . ESOPHAGOGASTRODUODENOSCOPY (EGD) WITH PROPOFOL N/A 10/14/2018   Procedure: ESOPHAGOGASTRODUODENOSCOPY (EGD) WITH PROPOFOL;  Surgeon: Lucilla Lame, MD;  Location: [facility] ENDOSCOPY;  Service: Endoscopy;  Laterality: N/A;  . HYDROCELE EXCISION / REPAIR    . TRACHEAL SURGERY  2013  . TRACHEOSTOMY N/A    Dr Kathyrn Sheriff    Medications Prior to Admission medications   Medication Sig Start Date End Date Taking? Authorizing Provider  acetaminophen (TYLENOL) 325 MG tablet Take 2 tablets (650 mg total) by mouth every 6 (six) hours as needed for moderate pain or fever. 04/01/19   Tyler Pita, MD  Biotin 1000 MCG tablet Take 1,000 mcg by mouth daily.     [provider]  busPIRone (BUSPAR) 15 MG tablet Take 1 tablet (15 mg total) by mouth 2 (two) times daily. 04/01/19   Tyler Pita, MD  Calcium Carbonate-Vitamin D 600-400 MG-UNIT tablet Take 1 tablet by mouth daily.     [provider]  escitalopram (LEXAPRO) 20 MG tablet Take 20 mg by mouth daily.    [provider]  feeding supplement, ENSURE ENLIVE, (ENSURE ENLIVE) LIQD Take 237 mLs by mouth 2 (two) times daily between meals. 04/02/19   Tyler Pita, MD  fluconazole (DIFLUCAN) 150 MG tablet Take 150 mg by mouth once a week.  05/12/19   [provider]  folic acid (FOLVITE) 1 MG tablet Take 1 mg by mouth daily.    [provider]  gabapentin (NEURONTIN) 400 MG capsule Take 1 capsule (400 mg total) by mouth 3 (three) times daily. 04/01/19   Tyler Pita, MD  ipratropium-albuterol (DUONEB) 0.5-2.5 (3) MG/3ML SOLN Take 3 mLs by nebulization every 6 (six) hours as needed. 04/01/19   Tyler Pita, MD  iron polysaccharides (FERREX 150) 150 MG capsule Take 150 mg by mouth daily.  04/30/18 05/12/19  [provider]  lovastatin (MEVACOR) 20 MG tablet Take 20  mg by mouth at bedtime.    [provider]  meclizine (ANTIVERT) 25 MG tablet Take 25 mg by mouth 2 (two) times daily as needed for dizziness.     [provider]  Melatonin 5 MG TABS Take 1 tablet (5 mg total) by mouth at bedtime. 04/01/19   Tyler Pita, MD  metFORMIN (GLUCOPHAGE) 1000 MG tablet Take 1,000 mg by mouth 2 (two) times daily as needed (for BS >110 in the morning and >150 in the evening).     [provider]  metoprolol tartrate (LOPRESSOR) 25 MG tablet Take 1 tablet (25 mg total) by mouth 2 (two) times daily. Patient taking differently: Take 25 mg by mouth daily.  04/01/19   Tyler Pita, MD  Nebulizers (COMPRESSOR/NEBULIZER) MISC Nebulizer with supplies, trach  collar for med admin. 04/01/19   Tyler Pita, MD  ondansetron (ZOFRAN) 4 MG tablet Take 1 tablet (4 mg total) by mouth every 8 (eight) hours as needed for nausea or vomiting. 11/09/18   Pabon, Diego F, MD  pantoprazole (PROTONIX) 40 MG tablet Take 40 mg by mouth daily.     [provider]  RESTASIS 0.05 % ophthalmic emulsion Place 1 drop into both eyes 2 (two) times daily.     [provider]  rOPINIRole (REQUIP) 0.5 MG tablet Take 0.5 mg by mouth at bedtime.  06/09/18   [provider]    Allergies Shellfish allergy, Sodium sulfite, and Codeine  Family Hx Family History  Problem Relation Age of Onset  . Cancer Father        bone cancer  . Cancer Sister        lymphoma  . Cancer Brother   . Breast cancer Neg Hx     Social Hx Social History   Tobacco Use  . Smoking status: Never Smoker  . Smokeless tobacco: Never Used  Substance Use Topics  . Alcohol use: No  . Drug use: No     Review of Systems  Constitutional: Negative for fever. Negative for chills. Eyes: Negative for visual changes. ENT: Negative for sore throat. + trach issue Cardiovascular: Negative for chest pain. Respiratory: Negative for shortness of breath. Gastrointestinal: Negative for nausea. Negative for vomiting.  Genitourinary: Negative for dysuria. Musculoskeletal: Negative for leg swelling. Skin: Negative for rash. Neurological: Negative for headaches.   Physical Exam  Vital Signs: ED Triage Vitals [07/20/19 1702]  Enc Vitals Group     BP (!) 112/97     Pulse Rate 80     Resp 18     Temp 98 F (36.7 C)     Temp Source Oral     SpO2 95 %     Weight 146 lb (66.2 kg)     Height 5\' 4"  (1.626 m)     Head Circumference      Peak Flow      Pain Score 0     Pain Loc      Pain Edu?      Excl. in [place]?     Constitutional: Alert and oriented. Well appearing. NAD.  Head: Normocephalic. Atraumatic. Eyes: Conjunctivae clear. Sclera anicteric. Pupils equal and  symmetric. Nose: No masses or lesions. No congestion or rhinorrhea. Mouth/Throat: Wearing mask.  Neck: No stridor. 6 Shiley cuffed trach in place, cuff is inflated on arrival. Able to see part of the balloon at the tracheostomy skin surface site, query possible malpositioning, perhaps not deep enough. Unable to phonate over tach with coverage  of opening.  Cardiovascular: Normal rate, regular rhythm. Extremities well perfused. Respiratory: Normal respiratory effort.  Lungs CTAB. Genitourinary: Deferred. Musculoskeletal: No lower extremity edema. No deformities. Neurologic:  Normal speech and language. No gross focal or lateralizing neurologic deficits are appreciated.  Skin: Skin is warm, dry and intact. No rash noted. Psychiatric: Mood and affect are appropriate for situation.     Radiology  Personally reviewed available imaging myself.   CXR - IMPRESSION:  1. No acute intrathoracic process.  2. Tracheostomy tube well positioned within the trachea at the  thoracic inlet.    Procedures  Procedure(s) performed (including critical care):  Procedures   Initial Impression / Assessment and Plan / MDM / ED Course  79 y.o. 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  Patient had a mucous plug that she was unable to clear and required the removal of the entire trach and replacement.  This resolved the mucous plug, but upon replacement of the trach she is unable to speak over the trach like she normally does.  Concern for possible trach malposition or vocal cord spasm.  CXR reveals no acute intrathoracic process and tracheostomy tube well positioned.  Deflated the cuff and observed for several minutes.  After this, patient was able to speak and phonate at her baseline.  Reports resolution in her symptoms.   Continues to breath well, no respiratory issues, no recurrence of mucous plugging.  As such, feel patient is stable for discharge.  She normally has her cuff deflated, will keep deflated as is her baseline.   Will give a short course of antibiotics for coverage of a possible bronchitis or perhaps an early pneumonia given the changes to her secretions.  Advised outpatient follow-up with her ENT doctors.  Patient voices understanding and is comfortable w/ the plan and discharge.   _______________________________   As part of my medical decision making I have reviewed available labs, radiology tests, reviewed old records/performed chart review, obtained additional history from family, husband at bedside.    Final Clinical Impression(s) / ED Diagnosis  Final diagnoses:  Tracheostomy malfunction [facility])       Note:  This document was prepared using Dragon voice recognition software and may include unintentional dictation errors.   Lilia Pro., MD 07/20/19 [phone]

## 2019-07-20 NOTE — Discharge Instructions (Addendum)
Thank you for letting us take care of you in the emergency department today.   Please call to touch base with your [facility] ENT doctors about your symptoms and your visit. We will send you on a short course of antibiotics to treat for possible lung infection due to your change in secretions.   You can use the  Vaccine Finder at [email or web address] to find a COVID vaccine location near you.  Please return to the ER for any new or worsening symptoms.

## 2019-07-20 NOTE — ED Triage Notes (Signed)
Pt arrives via [facility] from home for reports of her trach tube coming out of her neck. Pt reports her husband pulled her trach tube out to clean it and now it does not feel like it is placed back correctly. PT unable to make sounds when she pushed on the trach per normal. Pt A&Ox4 and in NAD.

## 2019-07-23 ENCOUNTER — Ambulatory Visit: Payer: Medicare Other | Admitting: Neurology

## 2019-08-24 ENCOUNTER — Other Ambulatory Visit: Payer: Self-pay

## 2019-08-24 ENCOUNTER — Encounter: Payer: Self-pay | Admitting: Surgery

## 2019-08-24 ENCOUNTER — Ambulatory Visit (INDEPENDENT_AMBULATORY_CARE_PROVIDER_SITE_OTHER): Payer: Medicare Other | Admitting: Surgery

## 2019-08-24 VITALS — BP 106/57 | HR 82 | Temp 97.7°F | Resp 12 | Ht 64.0 in | Wt 153.0 lb

## 2019-08-24 DIAGNOSIS — K439 Ventral hernia without obstruction or gangrene: Secondary | ICD-10-CM

## 2019-08-24 NOTE — Patient Instructions (Addendum)
Please see your follow up appointment listed below.   Umbilical Hernia, Adult  A hernia is a bulge of tissue that pushes through an opening between muscles. An umbilical hernia happens in the abdomen, near the belly button (umbilicus). The hernia may contain tissues from the small intestine, large intestine, or fatty tissue covering the intestines (omentum). Umbilical hernias in adults tend to get worse over time, and they require surgical treatment. There are several types of umbilical hernias. You may have:  A hernia located just above or below the umbilicus (indirect hernia). This is the most common type of umbilical hernia in adults.  A hernia that forms through an opening formed by the umbilicus (direct hernia).  A hernia that comes and goes (reducible hernia). A reducible hernia may be visible only when you strain, lift something heavy, or cough. This type of hernia can be pushed back into the abdomen (reduced).  A hernia that traps abdominal tissue inside the hernia (incarcerated hernia). This type of hernia cannot be reduced.  A hernia that cuts off blood flow to the tissues inside the hernia (strangulated hernia). The tissues can start to die if this happens. This type of hernia requires emergency treatment. What are the causes? An umbilical hernia happens when tissue inside the abdomen presses on a weak area of the abdominal muscles. What increases the risk? You may have a greater risk of this condition if you:  Are obese.  Have had several pregnancies.  Have a buildup of fluid inside your abdomen (ascites).  Have had surgery that weakens the abdominal muscles. What are the signs or symptoms? The main symptom of this condition is a painless bulge at or near the belly button. A reducible hernia may be visible only when you strain, lift something heavy, or cough. Other symptoms may include:  Dull pain.  A feeling of pressure. Symptoms of a strangulated hernia may  include:  Pain that gets increasingly worse.  Nausea and vomiting.  Pain when pressing on the hernia.  Skin over the hernia becoming red or purple.  Constipation.  Blood in the stool. How is this diagnosed? This condition may be diagnosed based on:  A physical exam. You may be asked to cough or strain while standing. These actions increase the pressure inside your abdomen and force the hernia through the opening in your muscles. Your health care provider may try to reduce the hernia by pressing on it.  Your symptoms and medical history. How is this treated? Surgery is the only treatment for an umbilical hernia. Surgery for a strangulated hernia is done as soon as possible. If you have a small hernia that is not incarcerated, you may need to lose weight before having surgery. Follow these instructions at home:  Lose weight, if told by your health care provider.  Do not try to push the hernia back in.  Watch your hernia for any changes in color or size. Tell your health care provider if any changes occur.  You may need to avoid activities that increase pressure on your hernia.  Do not lift anything that is heavier than 10 lb (4.5 kg) until your health care provider says that this is safe.  Take over-the-counter and prescription medicines only as told by your health care provider.  Keep all follow-up visits as told by your health care provider. This is important. Contact a health care provider if:  Your hernia gets larger.  Your hernia becomes painful. Get help right away if:  You  develop sudden, severe pain near the area of your hernia.  You have pain as well as nausea or vomiting.  You have pain and the skin over your hernia changes color.  You develop a fever. This information is not intended to replace advice given to you by your health care provider. Make sure you discuss any questions you have with your health care provider. Document Revised: 03/27/2017 Document  Reviewed: 08/13/2016 Elsevier Patient Education  [place].

## 2019-08-25 ENCOUNTER — Encounter: Payer: Self-pay | Admitting: Surgery

## 2019-08-25 NOTE — Progress Notes (Signed)
Outpatient Surgical Follow Up  08/25/2019  Angela David is an 79 y.o. female.   Chief Complaint  Patient presents with  . 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 No complications related to her surgery. More recently She did fall and had multiple rib fx. She is doing much better from that. Recent ER visit due to trach issues. She did have a CXR that I have personally reviewed showing no PTN or acute issues.  She now c/o bulge abd wall, minimal discomfort. No fevers or chills. Taking po well.  Past Medical History:  Diagnosis Date  . Breast cancer ([place]) 2002   right breast chemo and radiation  . Cancer [facility]) 2002   right breast  . Diabetes mellitus without complication ([place])   . Diabetic peripheral neuropathy ([place]) 08/28/2016  . GERD (gastroesophageal reflux disease)   . Glaucoma   . High cholesterol   . History of colon polyps   . Peripheral neuropathy 08/28/2016  . Personal history of chemotherapy 2002   BREAST CA  . Personal history of malignant neoplasm of breast   . Personal history of radiation therapy 2002   BREAST CA  . Vocal cord paralysis 2012    Past Surgical History:  Procedure Laterality Date  . ABDOMINAL HYSTERECTOMY    . BREAST BIOPSY Right 2008   neg  . BREAST EXCISIONAL BIOPSY Right 2002   positive  . BREAST LUMPECTOMY Right 2002   chemo and radiation  . BREAST SURGERY Right 2002   LUMPECTOMY, radiation, chemotherapy  . CHOLECYSTECTOMY N/A 12/10/2017   Procedure: LAPAROSCOPIC CHOLECYSTECTOMY;  Surgeon: Benjamine Sprague, DO;  Location: [facility] ORS;  Service: General;  Laterality: N/A;  . COLON RESECTION Right 11/06/2018   Procedure: HAND ASSISTED LAPAROSCOPIC COLON RESECTION, RIGHT;  Surgeon: Jules Husbands, MD;  Location: [facility] ORS;  Service: General;  Laterality: Right;  . COLONOSCOPY  2009,12/30/2013  . COLONOSCOPY WITH PROPOFOL N/A 04/16/2018    Tubulovillous polyp proximal transverse colon, large sessile polyp, proximal transverse colon;  Surgeon: Robert Bellow, MD;  Location: [facility] ENDOSCOPY;  Service: Endoscopy;  Laterality: N/A;  . ESOPHAGOGASTRODUODENOSCOPY (EGD) WITH PROPOFOL N/A 10/14/2018   Procedure: ESOPHAGOGASTRODUODENOSCOPY (EGD) WITH PROPOFOL;  Surgeon: Lucilla Lame, MD;  Location: [facility] ENDOSCOPY;  Service: Endoscopy;  Laterality: N/A;  . HYDROCELE EXCISION / REPAIR    . TRACHEAL SURGERY  2013  . TRACHEOSTOMY N/A    Dr Kathyrn Sheriff    Family History  Problem Relation Age of Onset  . Cancer Father        bone cancer  . Cancer Sister        lymphoma  . Cancer Brother   . Breast cancer Neg Hx     Social History:  reports that she has never smoked. She has never used smokeless tobacco. She reports that she does not drink alcohol and does not use drugs.  Allergies:  Allergies  Allergen Reactions  . Shellfish Allergy Shortness Of Breath    respitatory distress   . Sodium Sulfite Shortness Of Breath    Severe Congestion that creates inability to breath  . Sulfa Antibiotics Other (See Comments) and Shortness Of Breath    Other reaction(s): OTHER Other reaction(s): Other (See Comments) respitatory distress  Other reaction(s): OTHER respitatory distress  Other reaction(s): OTHER Other reaction(s): Other (See Comments) respitatory distress  respitatory distress   .  Codeine Other (See Comments)    Per patient "it kept her up all night"    Medications reviewed.    ROS Full ROS performed and is otherwise negative other than what is stated in HPI   BP (!) 106/57   Pulse 82   Temp 97.7 F (36.5 C) (Oral)   Resp 12   Ht 5\' 4"  (1.626 m)   Wt 153 lb (69.4 kg)   SpO2 97%   BMI 26.26 kg/m   Physical Exam Vitals and nursing note reviewed. Exam conducted with a chaperone present.  Constitutional:      General: She is not in acute distress.    Appearance: Normal appearance. She is normal weight.  Neck:      Comments: Trach in place Abdominal:     General: Abdomen is flat. There is no distension.     Palpations: Abdomen is soft.     Tenderness: There is no abdominal tenderness.     Hernia: A hernia is present.     Comments: Midline 4 cm reducible ventral hernia  Musculoskeletal:     Cervical back: Normal range of motion and neck supple.  Skin:    General: Skin is warm and dry.     Capillary Refill: Capillary refill takes less than 2 seconds.     Comments: 2 cms pigmented lesion left shoulder, raised, no ulcerations  Neurological:     General: No focal deficit present.     Mental Status: She is alert.  Psychiatric:        Mood and Affect: Mood normal.        Behavior: Behavior normal.        Thought Content: Thought content normal.        Judgment: Judgment normal.      Assessment/Plan: Incisional hernia w minimal sxs. I had a  Lengthy d/w the pt and family. I do think that this needs to be repair at some point in time. Pt is currently not ready and wishes to wait. Regarding The skin lesion on her left shoulder she wishes to have it removed given concerns for cancer. We will do this in a couple of weeks here in the office. Greater than 50% of the 25 minutes  visit was spent in counseling/coordination of care   Caroleen Hamman, MD [place] Surgeon

## 2019-09-01 ENCOUNTER — Telehealth: Payer: Self-pay | Admitting: Internal Medicine

## 2019-09-01 DIAGNOSIS — J9602 Acute respiratory failure with hypercapnia: Secondary | ICD-10-CM

## 2019-09-01 DIAGNOSIS — J9601 Acute respiratory failure with hypoxia: Secondary | ICD-10-CM

## 2019-09-01 NOTE — Telephone Encounter (Signed)
Spoke to pt, who requested that Dr. Mortimer Fries place a order to adapt to d/c trilogy, as she has not wore machine in 15mo.  Pt feels that she does not need this machine, as she was advise by RT that "she was breathing better then the machine".   Dr. Mortimer Fries, please advise. Thanks

## 2019-09-01 NOTE — Telephone Encounter (Signed)
Pt is aware that order has been placed to adapt to d/c trilogy. Pt stated that she would contact ENT to make them aware of this.   Nothing further is needed at this time.

## 2019-09-01 NOTE — Telephone Encounter (Signed)
If patient does NOT want the machine, then please d/c

## 2019-09-07 ENCOUNTER — Ambulatory Visit (INDEPENDENT_AMBULATORY_CARE_PROVIDER_SITE_OTHER): Payer: Medicare Other | Admitting: Surgery

## 2019-09-07 ENCOUNTER — Other Ambulatory Visit: Payer: Self-pay

## 2019-09-07 ENCOUNTER — Encounter: Payer: Self-pay | Admitting: Surgery

## 2019-09-07 VITALS — BP 91/59 | HR 86 | Temp 98.6°F | Resp 12 | Ht 64.0 in | Wt 152.0 lb

## 2019-09-07 DIAGNOSIS — L720 Epidermal cyst: Secondary | ICD-10-CM | POA: Diagnosis not present

## 2019-09-07 NOTE — Patient Instructions (Addendum)
Patient may take Ibuprofen or Tylenol to help with the pain or discomfort. May apply cold compress to area.  Patient may resume with showering in 48 hours.  Patient will follow up with Dr.Pabon via Virtual Visit in one week.  Wound Care, Adult Taking care of your wound properly can help to prevent pain, infection, and scarring. It can also help your wound to heal more quickly. How to care for your wound Wound care      Follow instructions from your health care provider about how to take care of your wound. Make sure you: ? Wash your hands with soap and water before you change the bandage (dressing). If soap and water are not available, use hand sanitizer. ? Change your dressing as told by your health care provider. ? Leave stitches (sutures), skin glue, or adhesive strips in place. These skin closures may need to stay in place for 2 weeks or longer. If adhesive strip edges start to loosen and curl up, you may trim the loose edges. Do not remove adhesive strips completely unless your health care provider tells you to do that.  Check your wound area every day for signs of infection. Check for: ? Redness, swelling, or pain. ? Fluid or blood. ? Warmth. ? Pus or a bad smell.  Ask your health care provider if you should clean the wound with mild soap and water. Doing this may include: ? Using a clean towel to pat the wound dry after cleaning it. Do not rub or scrub the wound. ? Applying a cream or ointment. Do this only as told by your health care provider. ? Covering the incision with a clean dressing.  Ask your health care provider when you can leave the wound uncovered.  Keep the dressing dry until your health care provider says it can be removed. Do not take baths, swim, use a hot tub, or do anything that would put the wound underwater until your health care provider approves. Ask your health care provider if you can take showers. You may only be allowed to take sponge  baths. Medicines   If you were prescribed an antibiotic medicine, cream, or ointment, take or use the antibiotic as told by your health care provider. Do not stop taking or using the antibiotic even if your condition improves.  Take over-the-counter and prescription medicines only as told by your health care provider. If you were prescribed pain medicine, take it 30 or more minutes before you do any wound care or as told by your health care provider. General instructions  Return to your normal activities as told by your health care provider. Ask your health care provider what activities are safe.  Do not scratch or pick at the wound.  Do not use any products that contain nicotine or tobacco, such as cigarettes and e-cigarettes. These may delay wound healing. If you need help quitting, ask your health care provider.  Keep all follow-up visits as told by your health care provider. This is important.  Eat a diet that includes protein, vitamin A, vitamin C, and other nutrient-rich foods to help the wound heal. ? Foods rich in protein include meat, dairy, beans, nuts, and other sources. ? Foods rich in vitamin A include carrots and dark green, leafy vegetables. ? Foods rich in vitamin C include citrus, tomatoes, and other fruits and vegetables. ? Nutrient-rich foods have protein, carbohydrates, fat, vitamins, or minerals. Eat a variety of healthy foods including vegetables, fruits, and whole grains. Contact a  health care provider if:  You received a tetanus shot and you have swelling, severe pain, redness, or bleeding at the injection site.  Your pain is not controlled with medicine.  You have redness, swelling, or pain around the wound.  You have fluid or blood coming from the wound.  Your wound feels warm to the touch.  You have pus or a bad smell coming from the wound.  You have a fever or chills.  You are nauseous or you vomit.  You are dizzy. Get help right away if:  You  have a red streak going away from your wound.  The edges of the wound open up and separate.  Your wound is bleeding, and the bleeding does not stop with gentle pressure.  You have a rash.  You faint.  You have trouble breathing. Summary  Always wash your hands with soap and water before changing your bandage (dressing).  To help with healing, eat foods that are rich in protein, vitamin A, vitamin C, and other nutrients.  Check your wound every day for signs of infection. Contact your health care provider if you suspect that your wound is infected. This information is not intended to replace advice given to you by your health care provider. Make sure you discuss any questions you have with your health care provider. Document Revised: 06/02/2018 Document Reviewed: 08/30/2015 Else[employer] Patient Education  [employer].

## 2019-09-09 NOTE — Progress Notes (Signed)
Procedure Note: 1. Excision 12 mm left shoulder  2.  Intermediate wound closure of 35 mm shoulder wound  Anesthesia: Lidocaine 1% with epinephrine total of 8 cc  EBL: Minimal  Findings: Cystic lesion left shoulder  Patient was prepped and draped in the sterile fashion and after consent was obtained she was placed in supine position.  Elliptical incision was created incorporating the lesion with a 15 blade knife.  Metzenbaum scissors were used to dissect through subcutaneous tissue and the specimen was sent for permanent pathology.  Hemostasis obtained with pressure.  The wound was closed in a layered fashion first layer with 3-0 Vicryl and the second layer with 4-0 Monocryl in a subcuticular fashion.  Dermabond was applied.  No complications were observed

## 2019-09-10 ENCOUNTER — Telehealth: Payer: Self-pay | Admitting: Emergency Medicine

## 2019-09-10 NOTE — Telephone Encounter (Signed)
Incoming call from patient stating she had excision of cyst of shoulder on Monday 09/07/19 performed by Dr Dahlia Byes. Pt states the area is red and sore, denies fevers, chills. Pt states she has a bandaide over the area and has not removed it since Monday that she came in.  Advised pt to have husband remove bandaide and to tell me how it looks. Pt states that on the inside area of the bandaide it is not red and no drainage. Advised patient that the redness is probably irritation from the bandaide. Advised patient since it is not draining to not put any other bandaides on. Also to apply ice to help relieve the soreness. Pt voiced understanding and agrees that it is possible irritation. Patient wants to wait for appt on Monday 7/19. Made aware to call the office if she has any other concerns.

## 2019-09-14 ENCOUNTER — Other Ambulatory Visit: Payer: Self-pay

## 2019-09-14 ENCOUNTER — Telehealth (INDEPENDENT_AMBULATORY_CARE_PROVIDER_SITE_OTHER): Payer: Self-pay | Admitting: Surgery

## 2019-09-14 DIAGNOSIS — Z09 Encounter for follow-up examination after completed treatment for conditions other than malignant neoplasm: Secondary | ICD-10-CM

## 2019-09-16 NOTE — Progress Notes (Signed)
I called Angela David's cell phone.  She is doing well.  No fevers or chills.  The husband noticed some white spots and was wondering about potential infection.  However she denies any erythema.  Pathology results discussed with her.  She feels that she is healing well. The option to the patient to either, face-to-face or do a virtual visit.  She is happy and I am not that concerned.  She will call us back if the area does not improve.

## 2019-11-16 ENCOUNTER — Inpatient Hospital Stay: Payer: Medicare Other

## 2019-11-16 ENCOUNTER — Inpatient Hospital Stay: Payer: Medicare Other | Attending: Hematology and Oncology | Admitting: Hematology and Oncology

## 2019-11-30 NOTE — Progress Notes (Signed)
This encounter was created in error - please disregard.

## 2019-12-06 NOTE — Progress Notes (Signed)
[facility]  [address], [address] [place], [place] [address] Phone: [phone]  Fax: [phone]   Clinic Day:  12/08/2019  Referring physician: Iva Boop*  Chief Complaint: Angela David is a 79 y.o. 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   HPI: The patient was last seen in the medical oncology clinic on 05/18/2019. At that time,  she denied any breast concerns.  She was on a ventilator at night. hematocrit 37.6, hemoglobin 11.6, MCV 82.6, platelets 231,000, WBC 7900, ANC 5500.  CA27.29 was 32.7 and CEA 11.4.  She received Prolia.  She was seen in the [facility] ER on 07/20/2019 via EMS due to issues with her tracheostomy tube.  A blockage in her trach tube was cleared by EMS, but when it was replaced, she could not speak like normal and she thought it to be misplaced. CXR revealed no acute intrathoracic process. Tracheostomy tube was well positioned within the trachea at the thoracic inlet.  She was discharged home. She is followed by Dr. Beverely Risen in Otolaryngology at [facility] regularly.   During the interim, she has been doing okay. She reports that she is "sad" because of her health and because her sister passed last week.  The patient reports weight gain but she has not changed her diet. Her swallowing is normal. Her upper back pain is stable. She is currently getting over a UTI. She performs regular breast self exams and does not have any breast concerns. She denies any dental issues.  She reports that she went to her podiatrist and was told that her left foot was broken. She does not remember every injuring the foot.  She stopped using the ventilator at night in 07/2019. She does not use oxygen anymore.   Past Medical History:  Diagnosis Date  . Breast cancer ([place]) 2002   right breast chemo and radiation  . Cancer [facility]) 2002   right breast  . Diabetes mellitus without  complication ([employer])   . Diabetic peripheral neuropathy ([place]) 08/28/2016  . GERD (gastroesophageal reflux disease)   . Glaucoma   . High cholesterol   . History of colon polyps   . Peripheral neuropathy 08/28/2016  . Personal history of chemotherapy 2002   BREAST CA  . Personal history of malignant neoplasm of breast   . Personal history of radiation therapy 2002   BREAST CA  . Vocal cord paralysis 2012    Past Surgical History:  Procedure Laterality Date  . ABDOMINAL HYSTERECTOMY    . BREAST BIOPSY Right 2008   neg  . BREAST EXCISIONAL BIOPSY Right 2002   positive  . BREAST LUMPECTOMY Right 2002   chemo and radiation  . BREAST SURGERY Right 2002   LUMPECTOMY, radiation, chemotherapy  . CHOLECYSTECTOMY N/A 12/10/2017   Procedure: LAPAROSCOPIC CHOLECYSTECTOMY;  Surgeon: Benjamine Sprague, DO;  Location: [facility] ORS;  Service: General;  Laterality: N/A;  . COLON RESECTION Right 11/06/2018   Procedure: HAND ASSISTED LAPAROSCOPIC COLON RESECTION, RIGHT;  Surgeon: Jules Husbands, MD;  Location: [facility] ORS;  Service: General;  Laterality: Right;  . COLONOSCOPY  2009,12/30/2013  . COLONOSCOPY WITH PROPOFOL N/A 04/16/2018   Tubulovillous polyp proximal transverse colon, large sessile polyp, proximal transverse colon;  Surgeon: Robert Bellow, MD;  Location: [facility] ENDOSCOPY;  Service: Endoscopy;  Laterality: N/A;  . ESOPHAGOGASTRODUODENOSCOPY (EGD) WITH PROPOFOL N/A 10/14/2018   Procedure: ESOPHAGOGASTRODUODENOSCOPY (EGD) WITH PROPOFOL;  Surgeon: Lucilla Lame, MD;  Location: [facility] ENDOSCOPY;  Service: Endoscopy;  Laterality: N/A;  . HYDROCELE EXCISION / REPAIR    . TRACHEAL SURGERY  2013  . TRACHEOSTOMY N/A    Dr Kathyrn Sheriff    Family History  Problem Relation Age of Onset  . Cancer Father        bone cancer  . Cancer Sister        lymphoma  . Cancer Brother   . Breast cancer Neg Hx     Social History:  reports that she has never smoked. She has never used smokeless tobacco. She reports that she  does not drink alcohol and does not use drugs.  She denies any smoking. She worked in [employer] (medical) for 26 years. She is married and her husband's name is [name].  She lives in [place]. The patient is alone today.   Allergies:  Allergies  Allergen Reactions  . Shellfish Allergy Shortness Of Breath    respitatory distress   . Sodium Sulfite Shortness Of Breath    Severe Congestion that creates inability to breath  . Sulfa Antibiotics Other (See Comments) and Shortness Of Breath    Other reaction(s): OTHER Other reaction(s): Other (See Comments) respitatory distress  Other reaction(s): OTHER respitatory distress  Other reaction(s): OTHER Other reaction(s): Other (See Comments) respitatory distress  respitatory distress   . Codeine Other (See Comments)    Per patient "it kept her up all night"    Current Medications: Current Outpatient Medications  Medication Sig Dispense Refill  . acetaminophen (TYLENOL) 325 MG tablet Take 2 tablets (650 mg total) by mouth every 6 (six) hours as needed for moderate pain or fever. 30 tablet 0  . Biotin 1000 MCG tablet Take 1,000 mcg by mouth daily.     . busPIRone (BUSPAR) 15 MG tablet Take 1 tablet (15 mg total) by mouth 2 (two) times daily. 60 tablet 0  . Calcium Carbonate-Vitamin D 600-400 MG-UNIT tablet Take 1 tablet by mouth daily.     Marland Kitchen escitalopram (LEXAPRO) 20 MG tablet Take 20 mg by mouth daily.    Marland Kitchen estradiol (ESTRACE) 0.1 MG/GM vaginal cream estradiol 0.01% (0.1 mg/gram) vaginal cream    . feeding supplement, ENSURE ENLIVE, (ENSURE ENLIVE) LIQD Take 237 mLs by mouth 2 (two) times daily between meals. 818 mL 12  . folic acid (FOLVITE) 1 MG tablet Take 1 mg by mouth daily.    Marland Kitchen gabapentin (NEURONTIN) 400 MG capsule Take 1 capsule (400 mg total) by mouth 3 (three) times daily. 90 capsule 0  . gabapentin (NEURONTIN) 600 MG tablet Take 600 mg by mouth 3 (three) times daily.    Marland Kitchen glucose blood (CONTOUR NEXT TEST) test strip 2 (two) times daily.     Marland Kitchen ipratropium-albuterol (DUONEB) 0.5-2.5 (3) MG/3ML SOLN Take 3 mLs by nebulization every 6 (six) hours as needed. 360 mL 0  . lovastatin (MEVACOR) 20 MG tablet Take 20 mg by mouth at bedtime.    . Melatonin 5 MG TABS Take 1 tablet (5 mg total) by mouth at bedtime. 30 tablet 0  . metFORMIN (GLUCOPHAGE) 1000 MG tablet Take 1,000 mg by mouth 2 (two) times daily as needed (for BS >110 in the morning and >150 in the evening).     . metoprolol tartrate (LOPRESSOR) 25 MG tablet Take 1 tablet (25 mg total) by mouth 2 (two) times daily. (Patient taking differently: Take 25 mg by mouth daily. ) 60 tablet 0  . Nebulizers (COMPRESSOR/NEBULIZER) MISC Nebulizer with supplies, trach collar for med admin.  1 each 0  . neomycin-polymyxin b-dexamethasone (MAXITROL) 3.5-10000-0.1 SUSP     . pantoprazole (PROTONIX) 40 MG tablet Take 40 mg by mouth daily.     Marland Kitchen rOPINIRole (REQUIP) 0.5 MG tablet Take 0.5 mg by mouth at bedtime.     . sodium chloride 0.9 % nebulizer solution USE 5 ML BY NEBULIZTION AS NEEDED FOR WHEEZING    . EPINEPHrine 0.3 mg/0.3 mL IJ SOAJ injection epinephrine 0.3 mg/0.3 mL injection, auto-injector (Patient not taking: Reported on 12/08/2019)    . fluconazole (DIFLUCAN) 150 MG tablet Take 150 mg by mouth once a week.  (Patient not taking: Reported on 12/08/2019)    . meclizine (ANTIVERT) 25 MG tablet Take 25 mg by mouth 2 (two) times daily as needed for dizziness.  (Patient not taking: Reported on 12/08/2019)    . ondansetron (ZOFRAN) 4 MG tablet Take 1 tablet (4 mg total) by mouth every 8 (eight) hours as needed for nausea or vomiting. (Patient not taking: Reported on 12/08/2019) 20 tablet 0   No current facility-administered medications for this visit.    Review of Systems  Constitutional: Negative for chills, diaphoresis, fever, malaise/fatigue and weight loss (up 12 lbs).       Doing ok.  HENT: Negative for congestion, ear discharge, ear pain, hearing loss, nosebleeds, sinus pain, sore  throat and tinnitus.        S/p tracheostomy. Able to swallow normally.  Eyes: Negative for blurred vision.  Respiratory: Positive for shortness of breath (secondary to tracheostomy; better). Negative for cough, hemoptysis and sputum production.   Cardiovascular: Negative for chest pain, palpitations and leg swelling.  Gastrointestinal: Negative for abdominal pain, blood in stool, constipation, diarrhea, heartburn, melena, nausea and vomiting.  Genitourinary: Negative for dysuria, frequency, hematuria and urgency.       Recurrent UTIs  Musculoskeletal: Positive for back pain (degenerative disc; upper back). Negative for falls, joint pain, myalgias and neck pain.       Broken left foot.  Skin: Negative for itching and rash.  Neurological: Negative for dizziness, tingling, tremors, sensory change, speech change, weakness and headaches.  Endo/Heme/Allergies: Does not bruise/bleed easily.  Psychiatric/Behavioral: Positive for depression (due to her health and passing of sister last week). Negative for memory loss. The patient is not nervous/anxious and does not have insomnia.   All other systems reviewed and are negative.   Performance status (ECOG): 2  Vitals Blood pressure 106/60, pulse 80, temperature 97.9 F (36.6 C), temperature source Tympanic, resp. rate 18, height _0  (1.626 m), weight 160 lb 9.7 oz (72.8 kg), SpO2 97 %.   Physical Exam Vitals and nursing note reviewed.  Constitutional:      General: She is not in acute distress.    Appearance: She is well-developed. She is not diaphoretic.     Comments: Requires assistance onto exam table.  HENT:     Head: Normocephalic and atraumatic.     Comments: Short gray hair.    Mouth/Throat:     Mouth: Mucous membranes are moist.     Pharynx: Oropharynx is clear. No oropharyngeal exudate.  Eyes:     General: No scleral icterus.    Conjunctiva/sclera: Conjunctivae normal.     Pupils: Pupils are equal, round, and reactive to light.      Comments: Glasses.  Brown eyes.  Neck:     Comments: Tracheostomy. Cardiovascular:     Rate and Rhythm: Normal rate and regular rhythm.     Heart sounds: Normal heart sounds. No  murmur heard.   Pulmonary:     Effort: Pulmonary effort is normal. No respiratory distress.     Breath sounds: Normal breath sounds. No wheezing or rales.  Chest:     Chest wall: No tenderness.     Breasts:        Right: Skin change (fibrocystic changes and edema inferiorly) and tenderness present. No inverted nipple, mass or nipple discharge.        Left: Skin change (fibrocystic changes at 1, 3, and 5-6 o'clock) present. No inverted nipple, mass, nipple discharge or tenderness.  Abdominal:     General: Bowel sounds are normal. There is no distension.     Palpations: Abdomen is soft. There is no mass.     Tenderness: There is no abdominal tenderness. There is no guarding or rebound.  Musculoskeletal:        General: No swelling or tenderness. Normal range of motion.     Cervical back: Normal range of motion and neck supple.     Right lower leg: No edema.     Left lower leg: No edema.  Lymphadenopathy:     Head:     Right side of head: No preauricular, posterior auricular or occipital adenopathy.     Left side of head: No preauricular, posterior auricular or occipital adenopathy.     Cervical: No cervical adenopathy.     Upper Body:     Right upper body: No supraclavicular or axillary adenopathy.     Left upper body: No supraclavicular or axillary adenopathy.     Lower Body: No right inguinal adenopathy. No left inguinal adenopathy.  Skin:    General: Skin is warm and dry.  Neurological:     Mental Status: She is alert and oriented to person, place, and time.  Psychiatric:        Behavior: Behavior normal.        Thought Content: Thought content normal.        Judgment: Judgment normal.    Appointment on 12/08/2019  Component Date Value Ref Range Status  . Sodium 12/08/2019 137  135 - 145  mmol/L Final  . Potassium 12/08/2019 4.3  3.5 - 5.1 mmol/L Final  . Chloride 12/08/2019 102  98 - 111 mmol/L Final  . CO2 12/08/2019 26  22 - 32 mmol/L Final  . Glucose, Bld 12/08/2019 172* 70 - 99 mg/dL Final   Glucose reference range applies only to samples taken after fasting for at least 8 hours.  . BUN 12/08/2019 17  8 - 23 mg/dL Final  . Creatinine, Ser 12/08/2019 1.17* 0.44 - 1.00 mg/dL Final  . Calcium 30/74/7627 9.5  8.9 - 10.3 mg/dL Final  . Total Protein 12/08/2019 7.7  6.5 - 8.1 g/dL Final  . Albumin 72/89/3291 3.9  3.5 - 5.0 g/dL Final  . AST 15/24/5857 25  15 - 41 U/L Final  . ALT 12/08/2019 21  0 - 44 U/L Final  . Alkaline Phosphatase 12/08/2019 63  38 - 126 U/L Final  . Total Bilirubin 12/08/2019 0.5  0.3 - 1.2 mg/dL Final  . GFR, Estimated 12/08/2019 45* >60 mL/min Final  . Anion gap 12/08/2019 9  5 - 15 Final   Performed at [facility], [address], [place], [place] [address]  . WBC 12/08/2019 9.3  4.0 - 10.5 K/uL Final  . RBC 12/08/2019 4.83  3.87 - 5.11 MIL/uL Final  . Hemoglobin 12/08/2019 12.5  12.0 - 15.0 g/dL Final  . HCT  12/08/2019 39.2  36 - 46 % Final  . MCV 12/08/2019 81.2  80.0 - 100.0 fL Final  . MCH 12/08/2019 25.9* 26.0 - 34.0 pg Final  . MCHC 12/08/2019 31.9  30.0 - 36.0 g/dL Final  . RDW 12/08/2019 13.4  11.5 - 15.5 % Final  . Platelets 12/08/2019 242  150 - 400 K/uL Final  . nRBC 12/08/2019 0.0  0.0 - 0.2 % Final  . Neutrophils Relative % 12/08/2019 73  % Final  . Neutro Abs 12/08/2019 6.9  1.7 - 7.7 K/uL Final  . Lymphocytes Relative 12/08/2019 20  % Final  . Lymphs Abs 12/08/2019 1.8  0.7 - 4.0 K/uL Final  . Monocytes Relative 12/08/2019 5  % Final  . Monocytes Absolute 12/08/2019 0.4  0.1 - 1.0 K/uL Final  . Eosinophils Relative 12/08/2019 1  % Final  . Eosinophils Absolute 12/08/2019 0.1  0.0 - 0.5 K/uL Final  . Basophils Relative 12/08/2019 0  % Final  . Basophils Absolute 12/08/2019 0.0  0.0 - 0.1 K/uL Final  . Immature  Granulocytes 12/08/2019 1  % Final  . Abs Immature Granulocytes 12/08/2019 0.07  0.00 - 0.07 K/uL Final   Performed at [facility], [address], [place], [place] [address]    Assessment:  Angela David is a 79 y.o. female with stage II invasive right breast cancer s/p lumpectomy with sentinel lymph node biopsy on 08/14/2000.  Pathology revealed a 1.0 to 1.5 cm grade II IDC.  There was evidence of lymphatic invasion.  Margins were clear.  Tumor was ER-, PR+, and Her2/neu -. Subsequent SLN and axillary dissection confirmed metastatic carcinoma in 1 lymph node (pathology unavailable).  Pathologic stage was T1cN1M0.    She received epirubicin and cyclophosphamide (EC) for 4 cycles (09/2000 - 01/2001).  She received radiation in 02/2001 - 03/2001. She received tamoxifen for five years (ended ~ 02/2006).   Bilateral mammogram on 01/29/2018 revealed no evidence of malignancy.  Bilateral screening mammogram on 04/08/2019 showed no evidence of malignancy.   CEA has been followed:  11.4 on 12/21/2015, 8.1 on 02/22/2016, 7.2 on 06/29/2016, 10.1 on 01/15/2017, 11.3 on 09/03/2017, 9.4 on 03/04/2018, 9.6 on 09/03/2018, 11.4 on 05/18/2019, and 12.3 on 12/08/2019.  CA27.29 has been followed:  37.5 on 12/21/2015, 31.4 on 06/30/2016, 29.5 on 01/04/2017, 24.4 on 09/03/2017, 27.8 on 03/04/2018, 27.8 on 03/04/2018, 32.7 on 05/18/2019, and 23.3 on 12/08/2019.  PET scan on 09/16/2018 revealed no evidence of metabolically active malignancy or metastatic disease.   Bone density on 03/25/2014 revealed osteopenia with a T-score of -1.4 in the left femoral neck.  Bone density on 04/09/2018 revealed osteopenia with a T-score of -1.4 in the right femoral neck.  She has received Prolia every 6 months (01/18/2016 - 05/18/2019).  Colonoscopy on 04/16/2018 revealed two 6 to 12 mm polyps in the transverse colon (removed), one 45 mm polyp in the proximal transverse colon, and one 7 mm polyp at the  recto-sigmoid colon (removed).  Treatment for the larger 4.5 cm polyp in the transverse colon was unsuccessful.  The lesion was tatooed.  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  There was no dysplasia or malignancy.    She underwent a laparoscopic right hemicolectomy with stapled ileocolostomy on 11/06/2018.  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 There were 25 lymph nodes negative for malignancy. Surgical margins were free of of dysplasia  She is s/p tracheostomy for tracheal stenosis.  She stopped using the ventilator at night in 07/2019. She no longer needs oxygen.  Symptomatically, she feels "ok".  Her sister passed last week. She denies any breast concerns or dental issues.  Exam is stable.  Plan: 1.   Labs today: CBC with diff, CMP, CA27.29, CEA. 2.   Stage II right breast cancer             Clinically, she is doing well.  Exam reveals no evidence of recurrent disease.              She is s/p lumpectomy, adjuvant chemotherapy, radiation, and 5 years of tamoxifen.             Bilateral screening mammogram on 04/08/2019 showed no evidence of malignancy.   CA 27.29 is normal.             Schedule mammogram on 04/08/2020.  Continue to monitor 3.   Elevated CEA             Patient has had an elevated CEA since 11/2015.   CEA has ranged between 7.2 - 11.4.   CEA is 12.3.             Etiology remains uncertain.             PET scan on 09/16/2018 revealed no evidence of metabolically active disease.  Follow-up CEA.  If repeat is > 15% above baseline, will repeat imaging.  Continue to monitor. 4.   Osteopenia             Bone density on 04/09/2018 revealed osteopenia.             She receives Prolia every 6 months (last 05/18/2019).  Prolia today. 5.   Transverse colon polyp             Patient is s/p right laparoscopic right  hemicolectomy on 11/06/2018.  Pathology reviewed- tubular adenoma.  Follow-up with GI for surveillance endoscopies. 6.   Prolia today. 7.   Mammogram on 04/08/2020. 8.   RTC in 6 months for MD assessment, labs (CBC with diff, CMP, CA27.29), review of mammogram and Prolia.  I discussed the assessment and treatment plan with the patient.  The patient was provided an opportunity to ask questions and all were answered.  The patient agreed with the plan and demonstrated an understanding of the instructions.  The patient was advised to call back if the symptoms worsen or if the condition fails to improve as anticipated.  I provided 16 minutes of face-to-face time during this this encounter and > 50% was spent counseling as documented under my assessment and plan.  An additional 8 minutes were spent reviewing her chart (Epic and Care Everywhere) including notes, labs, and imaging studies.    Lequita Asal, MD, PhD    12/08/2019, 1:46 PM  I, Mirian Mo Tufford, am acting as a Education administrator for [employer]. Mike Gip, MD.   I, Momina Hunton C. Mike Gip, MD, have reviewed the above documentation for accuracy and completeness, and I agree with the above.

## 2019-12-08 ENCOUNTER — Other Ambulatory Visit: Payer: Self-pay

## 2019-12-08 ENCOUNTER — Inpatient Hospital Stay: Payer: Medicare Other | Attending: Hematology and Oncology

## 2019-12-08 ENCOUNTER — Inpatient Hospital Stay: Payer: Medicare Other

## 2019-12-08 ENCOUNTER — Encounter: Payer: Self-pay | Admitting: Hematology and Oncology

## 2019-12-08 ENCOUNTER — Inpatient Hospital Stay (HOSPITAL_BASED_OUTPATIENT_CLINIC_OR_DEPARTMENT_OTHER): Payer: Medicare Other | Admitting: Hematology and Oncology

## 2019-12-08 VITALS — BP 106/60 | HR 80 | Temp 97.9°F | Resp 18 | Ht 64.0 in | Wt 160.6 lb

## 2019-12-08 DIAGNOSIS — C50811 Malignant neoplasm of overlapping sites of right female breast: Secondary | ICD-10-CM

## 2019-12-08 DIAGNOSIS — Z853 Personal history of malignant neoplasm of breast: Secondary | ICD-10-CM | POA: Insufficient documentation

## 2019-12-08 DIAGNOSIS — Z171 Estrogen receptor negative status [ER-]: Secondary | ICD-10-CM

## 2019-12-08 DIAGNOSIS — M8580 Other specified disorders of bone density and structure, unspecified site: Secondary | ICD-10-CM | POA: Diagnosis not present

## 2019-12-08 DIAGNOSIS — M85851 Other specified disorders of bone density and structure, right thigh: Secondary | ICD-10-CM

## 2019-12-08 DIAGNOSIS — D126 Benign neoplasm of colon, unspecified: Secondary | ICD-10-CM

## 2019-12-08 DIAGNOSIS — R970 Elevated carcinoembryonic antigen [CEA]: Secondary | ICD-10-CM

## 2019-12-08 DIAGNOSIS — C50011 Malignant neoplasm of nipple and areola, right female breast: Secondary | ICD-10-CM

## 2019-12-08 DIAGNOSIS — Z9221 Personal history of antineoplastic chemotherapy: Secondary | ICD-10-CM | POA: Insufficient documentation

## 2019-12-08 DIAGNOSIS — M85852 Other specified disorders of bone density and structure, left thigh: Secondary | ICD-10-CM

## 2019-12-08 DIAGNOSIS — Z923 Personal history of irradiation: Secondary | ICD-10-CM | POA: Insufficient documentation

## 2019-12-08 LAB — CBC WITH DIFFERENTIAL/PLATELET
Abs Immature Granulocytes: 0.07 10*3/uL (ref 0.00–0.07)
Basophils Absolute: 0 10*3/uL (ref 0.0–0.1)
Basophils Relative: 0 %
Eosinophils Absolute: 0.1 10*3/uL (ref 0.0–0.5)
Eosinophils Relative: 1 %
HCT: 39.2 % (ref 36.0–46.0)
Hemoglobin: 12.5 g/dL (ref 12.0–15.0)
Immature Granulocytes: 1 %
Lymphocytes Relative: 20 %
Lymphs Abs: 1.8 10*3/uL (ref 0.7–4.0)
MCH: 25.9 pg — ABNORMAL LOW (ref 26.0–34.0)
MCHC: 31.9 g/dL (ref 30.0–36.0)
MCV: 81.2 fL (ref 80.0–100.0)
Monocytes Absolute: 0.4 10*3/uL (ref 0.1–1.0)
Monocytes Relative: 5 %
Neutro Abs: 6.9 10*3/uL (ref 1.7–7.7)
Neutrophils Relative %: 73 %
Platelets: 242 10*3/uL (ref 150–400)
RBC: 4.83 MIL/uL (ref 3.87–5.11)
RDW: 13.4 % (ref 11.5–15.5)
WBC: 9.3 10*3/uL (ref 4.0–10.5)
nRBC: 0 % (ref 0.0–0.2)

## 2019-12-08 LAB — COMPREHENSIVE METABOLIC PANEL
ALT: 21 U/L (ref 0–44)
AST: 25 U/L (ref 15–41)
Albumin: 3.9 g/dL (ref 3.5–5.0)
Alkaline Phosphatase: 63 U/L (ref 38–126)
Anion gap: 9 (ref 5–15)
BUN: 17 mg/dL (ref 8–23)
CO2: 26 mmol/L (ref 22–32)
Calcium: 9.5 mg/dL (ref 8.9–10.3)
Chloride: 102 mmol/L (ref 98–111)
Creatinine, Ser: 1.17 mg/dL — ABNORMAL HIGH (ref 0.44–1.00)
GFR, Estimated: 45 mL/min — ABNORMAL LOW (ref >60)
Glucose, Bld: 172 mg/dL — ABNORMAL HIGH (ref 70–99)
Potassium: 4.3 mmol/L (ref 3.5–5.1)
Sodium: 137 mmol/L (ref 135–145)
Total Bilirubin: 0.5 mg/dL (ref 0.3–1.2)
Total Protein: 7.7 g/dL (ref 6.5–8.1)

## 2019-12-08 MED ORDER — DENOSUMAB 60 MG/ML ~~LOC~~ SOSY
60.0000 mg | PREFILLED_SYRINGE | Freq: Once | SUBCUTANEOUS | Status: AC
  Administered 2019-12-08: 60 mg via SUBCUTANEOUS

## 2019-12-08 NOTE — Progress Notes (Signed)
The patient c/o lower back pain today pain level 8

## 2019-12-09 LAB — CEA: CEA: 12.3 ng/mL — ABNORMAL HIGH (ref 0.0–4.7)

## 2019-12-09 LAB — CANCER ANTIGEN 27.29: CA 27.29: 23.3 U/mL (ref 0.0–38.6)

## 2019-12-21 IMAGING — NM NM HEPATO W/GB/PHARM/[PERSON_NAME]
3 series · 18 of 18 positions shown · non-contrast
Comparison: None.

CLINICAL DATA: Abdominal pain

EXAM:
NUCLEAR MEDICINE HEPATOBILIARY IMAGING WITH GALLBLADDER EF
VIEWS:
Anterior right upper quadrant
RADIOPHARMACEUTICALS:  5.44 mCi Oc-77m  Choletec IV

[Series 1000: hepatobiliary scan · 9.59mm/px · 6 of 60 frames shown]
[frame 6/60]
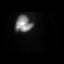
[frame 16/60]
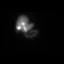
[frame 26/60]
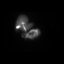
[frame 36/60]
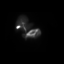
[frame 46/60]
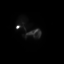
[frame 56/60]
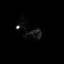

[Series 1000: hepato ef acq · 4.80mm/px · 6 of 120 frames shown]
[frame 11/120]
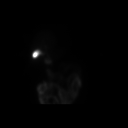
[frame 31/120]
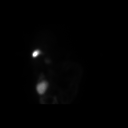
[frame 51/120]
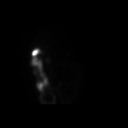
[frame 71/120]
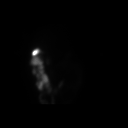
[frame 91/120]
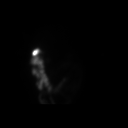
[frame 111/120]
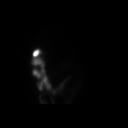

[Series 1000: hepato ef acq (results) · 4.80mm/px · 6 of 120 frames shown]
[frame 11/120]
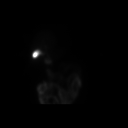
[frame 31/120]
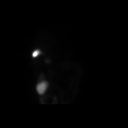
[frame 51/120]
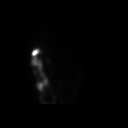
[frame 71/120]
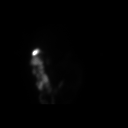
[frame 91/120]
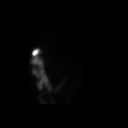
[frame 111/120]
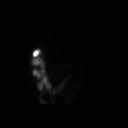

[18 of 18 positions shown; findings below may reference images not displayed]

FINDINGS: Liver uptake of radiotracer is unremarkable. There is prompt
visualization of gallbladder and small bowel, indicating patency of
the cystic and common bile ducts. The patient consumed 8 ounces of
Ensure orally with calculation of the computer generated ejection
fraction of radiotracer from the gallbladder. Patient did not report
clinical symptoms with the oral Ensure consumption. The computer
generated ejection fraction of radiotracer from the gallbladder is
diminished at 18%, normal greater than 33% using the oral agent.
IMPRESSION: Abnormally low ejection fraction of radiotracer from gallbladder, a
finding felt to be indicative of biliary dyskinesia. The patient did
not report clinical symptoms with the oral Ensure consumption.
Cystic and common bile ducts are patent as is evidenced by
visualization of gallbladder and small bowel.

## 2020-01-07 ENCOUNTER — Emergency Department
Admission: EM | Admit: 2020-01-07 | Discharge: 2020-01-07 | Disposition: A | Discharge status: Home / Self Care | Payer: Medicare Other | Attending: Emergency Medicine | Admitting: Emergency Medicine

## 2020-01-07 ENCOUNTER — Emergency Department: Payer: Medicare Other

## 2020-01-07 ENCOUNTER — Other Ambulatory Visit: Payer: Self-pay

## 2020-01-07 DIAGNOSIS — S42031A Displaced fracture of lateral end of right clavicle, initial encounter for closed fracture: Secondary | ICD-10-CM | POA: Insufficient documentation

## 2020-01-07 DIAGNOSIS — S4991XA Unspecified injury of right shoulder and upper arm, initial encounter: Secondary | ICD-10-CM | POA: Diagnosis present

## 2020-01-07 DIAGNOSIS — E1140 Type 2 diabetes mellitus with diabetic neuropathy, unspecified: Secondary | ICD-10-CM | POA: Diagnosis not present

## 2020-01-07 DIAGNOSIS — R930 Abnormal findings on diagnostic imaging of skull and head, not elsewhere classified: Secondary | ICD-10-CM | POA: Insufficient documentation

## 2020-01-07 DIAGNOSIS — Z7984 Long term (current) use of oral hypoglycemic drugs: Secondary | ICD-10-CM | POA: Diagnosis not present

## 2020-01-07 DIAGNOSIS — S42034A Nondisplaced fracture of lateral end of right clavicle, initial encounter for closed fracture: Secondary | ICD-10-CM

## 2020-01-07 DIAGNOSIS — W19XXXA Unspecified fall, initial encounter: Secondary | ICD-10-CM | POA: Diagnosis not present

## 2020-01-07 DIAGNOSIS — Z853 Personal history of malignant neoplasm of breast: Secondary | ICD-10-CM | POA: Diagnosis not present

## 2020-01-07 DIAGNOSIS — Z79899 Other long term (current) drug therapy: Secondary | ICD-10-CM | POA: Insufficient documentation

## 2020-01-07 LAB — CBC
HCT: 36 % (ref 36.0–46.0)
Hemoglobin: 11 g/dL — ABNORMAL LOW (ref 12.0–15.0)
MCH: 26.2 pg (ref 26.0–34.0)
MCHC: 30.6 g/dL (ref 30.0–36.0)
MCV: 85.7 fL (ref 80.0–100.0)
Platelets: 176 10*3/uL (ref 150–400)
RBC: 4.2 MIL/uL (ref 3.87–5.11)
RDW: 14.7 % (ref 11.5–15.5)
WBC: 10.4 10*3/uL (ref 4.0–10.5)
nRBC: 0 % (ref 0.0–0.2)

## 2020-01-07 LAB — COMPREHENSIVE METABOLIC PANEL
ALT: 19 U/L (ref 0–44)
AST: 29 U/L (ref 15–41)
Albumin: 3.6 g/dL (ref 3.5–5.0)
Alkaline Phosphatase: 50 U/L (ref 38–126)
Anion gap: 10 (ref 5–15)
BUN: 36 mg/dL — ABNORMAL HIGH (ref 8–23)
CO2: 22 mmol/L (ref 22–32)
Calcium: 9.8 mg/dL (ref 8.9–10.3)
Chloride: 105 mmol/L (ref 98–111)
Creatinine, Ser: 1.73 mg/dL — ABNORMAL HIGH (ref 0.44–1.00)
GFR, Estimated: 30 mL/min — ABNORMAL LOW (ref >60)
Glucose, Bld: 158 mg/dL — ABNORMAL HIGH (ref 70–99)
Potassium: 5.2 mmol/L — ABNORMAL HIGH (ref 3.5–5.1)
Sodium: 137 mmol/L (ref 135–145)
Total Bilirubin: 0.8 mg/dL (ref 0.3–1.2)
Total Protein: 6.9 g/dL (ref 6.5–8.1)

## 2020-01-07 NOTE — ED Notes (Signed)
Pt unable to sign for d/c due to broken signature pad. Pt and husband verbalized d/c instructions without further questions at the time.

## 2020-01-07 NOTE — ED Provider Notes (Signed)
[facility] Emergency Department Provider Note  Time seen: 5:22 PM  I have reviewed the triage vital signs and the nursing notes.   HISTORY  Chief Complaint Fall   HPI Angela David is a 79 y.o. female with a past medical history of diabetes, gastric reflux, vocal cord paralysis status post tracheostomy presents to the emergency department after a fall. According to the patient around 3 PM today she fell landing on her right shoulder. Patient did hit her head but denies LOC. No headache and is not currently on anticoagulation. Patient states significant pain to her right shoulder, laid on the ground for approximately 1 hour because she could not stand due to pain in her right shoulder. Patient denies any pain in any other extremity. No chest pain or abdominal pain. Patient is completing a course of antibiotics currently for urinary tract infection on her last day of antibiotics.   Past Medical History:  Diagnosis Date  . Breast cancer ([place]) 2002   right breast chemo and radiation  . Cancer [facility]) 2002   right breast  . Diabetes mellitus without complication ([place])   . Diabetic peripheral neuropathy ([place]) 08/28/2016  . GERD (gastroesophageal reflux disease)   . Glaucoma   . High cholesterol   . History of colon polyps   . Peripheral neuropathy 08/28/2016  . Personal history of chemotherapy 2002   BREAST CA  . Personal history of malignant neoplasm of breast   . Personal history of radiation therapy 2002   BREAST CA  . Vocal cord paralysis 2012    Patient Active Problem List   Diagnosis Date Noted  . 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 Drug-induced polyneuropathy ([place]) 05/05/2019  . 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 Weakness   . Altered mental status   . Hypoxia   . Acute respiratory failure with hypoxia and hypercapnia ([place]) 03/21/2019  . Hypotension 03/21/2019  . Acute encephalopathy 03/21/2019  . Multiple  fractures of ribs, right side, initial encounter for closed fracture 12/25/2018  . Colon polyp 11/06/2018  . Esophageal dysphagia   . 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 Stricture and stenosis of esophagus   . Elevated CEA 09/02/2018  . Goals of care, counseling/discussion 09/02/2018  . Polyp of transverse colon 09/02/2018  . Foreign body in right foot 05/26/2018  . History of tracheostomy 04/30/2018  . Moderate episode of recurrent major depressive disorder ([place]) 04/03/2018  . Poor balance 04/03/2018  . Shoulder lesion, right 02/28/2017  . Cervical radiculopathy 09/28/2016  . Peripheral neuropathy 08/28/2016  . Diabetic peripheral neuropathy ([place]) 08/28/2016  . Leg weakness, bilateral 08/13/2016  . Gait abnormality 08/13/2016  . Carcinoma of overlapping sites of right breast in female, estrogen receptor negative ([place]) 12/27/2015  . Malignant neoplasm of breast ([place]) 03/28/2015  . 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 Hypercholesterolemia 03/28/2015  . Recurrent major depressive disorder, in partial remission ([place]) 03/01/2015  . 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 Cephalalgia 09/15/2014  . Headache due to trauma 09/15/2014  . Osteopenia 01/07/2014  . Age-related osteoporosis without current pathological fracture 01/07/2014  . History of colonic polyps 12/14/2013  . History of breast cancer 11/26/2012  . H/O malignant neoplasm of breast 11/26/2012  . 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 Tracheostomy present ([place]) 12/06/2011  . Calcification of bronchus or trachea 01/16/2011  . 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 Anxiety state 12/19/2010  . Blood in sputum 12/19/2010  . HLD (hyperlipidemia)  12/19/2010  . Type 2 diabetes mellitus ([place]) 12/19/2010    Past Surgical History:  Procedure Laterality Date  . ABDOMINAL HYSTERECTOMY    . BREAST BIOPSY Right 2008   neg  . BREAST EXCISIONAL BIOPSY Right 2002   positive  . BREAST LUMPECTOMY  Right 2002   chemo and radiation  . BREAST SURGERY Right 2002   LUMPECTOMY, radiation, chemotherapy  . CHOLECYSTECTOMY N/A 12/10/2017   Procedure: LAPAROSCOPIC CHOLECYSTECTOMY;  Surgeon: Benjamine Sprague, DO;  Location: [facility] ORS;  Service: General;  Laterality: N/A;  . COLON RESECTION Right 11/06/2018   Procedure: HAND ASSISTED LAPAROSCOPIC COLON RESECTION, RIGHT;  Surgeon: Jules Husbands, MD;  Location: [facility] ORS;  Service: General;  Laterality: Right;  . COLONOSCOPY  2009,12/30/2013  . COLONOSCOPY WITH PROPOFOL N/A 04/16/2018   Tubulovillous polyp proximal transverse colon, large sessile polyp, proximal transverse colon;  Surgeon: Robert Bellow, MD;  Location: [facility] ENDOSCOPY;  Service: Endoscopy;  Laterality: N/A;  . ESOPHAGOGASTRODUODENOSCOPY (EGD) WITH PROPOFOL N/A 10/14/2018   Procedure: ESOPHAGOGASTRODUODENOSCOPY (EGD) WITH PROPOFOL;  Surgeon: Lucilla Lame, MD;  Location: [facility] ENDOSCOPY;  Service: Endoscopy;  Laterality: N/A;  . HYDROCELE EXCISION / REPAIR    . TRACHEAL SURGERY  2013  . TRACHEOSTOMY N/A    Dr Kathyrn Sheriff    Prior to Admission medications   Medication Sig Start Date End Date Taking? Authorizing Provider  acetaminophen (TYLENOL) 325 MG tablet Take 2 tablets (650 mg total) by mouth every 6 (six) hours as needed for moderate pain or fever. 04/01/19   Tyler Pita, MD  Biotin 1000 MCG tablet Take 1,000 mcg by mouth daily.     [provider]  busPIRone (BUSPAR) 15 MG tablet Take 1 tablet (15 mg total) by mouth 2 (two) times daily. 04/01/19   Tyler Pita, MD  Calcium Carbonate-Vitamin D 600-400 MG-UNIT tablet Take 1 tablet by mouth daily.     [provider]  EPINEPHrine 0.3 mg/0.3 mL IJ SOAJ injection epinephrine 0.3 mg/0.3 mL injection, auto-injector Patient not taking: Reported on 12/08/2019 09/10/18   [provider]  escitalopram (LEXAPRO) 20 MG tablet Take 20 mg by mouth daily.    [provider]  estradiol (ESTRACE) 0.1 MG/GM  vaginal cream estradiol 0.01% (0.1 mg/gram) vaginal cream 12/12/18   [provider]  feeding supplement, ENSURE ENLIVE, (ENSURE ENLIVE) LIQD Take 237 mLs by mouth 2 (two) times daily between meals. 04/02/19   Tyler Pita, MD  fluconazole (DIFLUCAN) 150 MG tablet Take 150 mg by mouth once a week.  Patient not taking: Reported on 12/08/2019 05/12/19   [provider]  folic acid (FOLVITE) 1 MG tablet Take 1 mg by mouth daily.    [provider]  gabapentin (NEURONTIN) 400 MG capsule Take 1 capsule (400 mg total) by mouth 3 (three) times daily. 04/01/19   Tyler Pita, MD  gabapentin (NEURONTIN) 600 MG tablet Take 600 mg by mouth 3 (three) times daily. 05/26/19   [provider]  glucose blood (CONTOUR NEXT TEST) test strip 2 (two) times daily. 04/20/19   [provider]  ipratropium-albuterol (DUONEB) 0.5-2.5 (3) MG/3ML SOLN Take 3 mLs by nebulization every 6 (six) hours as needed. 04/01/19   Tyler Pita, MD  lovastatin (MEVACOR) 20 MG tablet Take 20 mg by mouth at bedtime.    [provider]  meclizine (ANTIVERT) 25 MG tablet Take 25 mg by mouth 2 (two) times daily as needed for dizziness.  Patient not taking: Reported on 12/08/2019  [provider]  Melatonin 5 MG TABS Take 1 tablet (5 mg total) by mouth at bedtime. 04/01/19   Tyler Pita, MD  metFORMIN (GLUCOPHAGE) 1000 MG tablet Take 1,000 mg by mouth 2 (two) times daily as needed (for BS >110 in the morning and >150 in the evening).     [provider]  metoprolol tartrate (LOPRESSOR) 25 MG tablet Take 1 tablet (25 mg total) by mouth 2 (two) times daily. Patient taking differently: Take 25 mg by mouth daily.  04/01/19   Tyler Pita, MD  Nebulizers (COMPRESSOR/NEBULIZER) MISC Nebulizer with supplies, trach collar for med admin. 04/01/19   Tyler Pita, MD  neomycin-polymyxin b-dexamethasone (MAXITROL) 3.5-10000-0.1 SUSP  06/19/19   [provider]  ondansetron (ZOFRAN) 4 MG tablet Take 1 tablet (4 mg total) by mouth every 8 (eight) hours as needed for nausea or vomiting. Patient not taking: Reported on 12/08/2019 11/09/18   Caroleen Hamman F, MD  pantoprazole (PROTONIX) 40 MG tablet Take 40 mg by mouth daily.     [provider]  rOPINIRole (REQUIP) 0.5 MG tablet Take 0.5 mg by mouth at bedtime.  06/09/18   [provider]  sodium chloride 0.9 % nebulizer solution USE 5 ML BY NEBULIZTION AS NEEDED FOR WHEEZING 07/17/19   [provider]    Allergies  Allergen Reactions  . Shellfish Allergy Shortness Of Breath    respitatory distress   . Sodium Sulfite Shortness Of Breath    Severe Congestion that creates inability to breath  . Sulfa Antibiotics Other (See Comments) and Shortness Of Breath    Other reaction(s): OTHER Other reaction(s): Other (See Comments) respitatory distress  Other reaction(s): OTHER respitatory distress  Other reaction(s): OTHER Other reaction(s): Other (See Comments) respitatory distress  respitatory distress   . Codeine Other (See Comments)    Per patient "it kept her up all night"    Family History  Problem Relation Age of Onset  . Cancer Father        bone cancer  . Cancer Sister        lymphoma  . Cancer Brother   . Breast cancer Neg Hx     Social History Social History   Tobacco Use  . Smoking status: Never Smoker  . Smokeless tobacco: Never Used  Vaping Use  . Vaping Use: Never used  Substance Use Topics  . Alcohol use: No  . Drug use: No    Review of Systems Constitutional: Negative for fever. Cardiovascular: Negative for chest pain. Respiratory: Negative for shortness of breath. Gastrointestinal: Negative for abdominal pain, vomiting  Genitourinary: Negative for urinary compaints Musculoskeletal: Right shoulder pain Neurological: Negative for headache All other ROS negative  ____________________________________________   PHYSICAL  EXAM:  VITAL SIGNS: ED Triage Vitals  Enc Vitals Group     BP --      Pulse Rate 01/07/20 1721 81     Resp 01/07/20 1721 17     Temp 01/07/20 1721 98.4 F (36.9 C)     Temp Source 01/07/20 1721 Oral     SpO2 01/07/20 1721 97 %     Weight 01/07/20 1719 155 lb (70.3 kg)     Height 01/07/20 1719 5\' 4"  (1.626 m)     Head Circumference --      Peak Flow --      Pain Score --      Pain Loc --      Pain Edu? --  Excl. in [place]? --    Constitutional: Alert and oriented. Well appearing and in no distress. Eyes: Normal exam ENT      Head: Normocephalic and atraumatic.      Mouth/Throat: Mucous membranes are moist. Cardiovascular: Normal rate, regular rhythm.  Respiratory: Normal respiratory effort without tachypnea nor retractions. Breath sounds are clear Gastrointestinal: Soft and nontender. No distention.   Musculoskeletal: Moderate tenderness palpation over the distal right clavicle. Does not appear to have any tenderness over the humerus. Pain with any attempted range of motion of the right shoulder. Neuro vastly intact distally. Remainder of the right upper extremity is atraumatic/benign. All other extremities have great range of motion with no pain elicited. Neurologic:  Normal speech and language. No gross focal neurologic deficits  Skin:  Skin is warm, dry and intact.  Psychiatric: Mood and affect are normal.   ____________________________________________     RADIOLOGY  Comminuted distal right clavicle fracture CTA negative for acute abnormality  ____________________________________________   INITIAL IMPRESSION / ASSESSMENT AND PLAN / ED COURSE  Pertinent labs & imaging results that were available during my care of the patient were reviewed by me and considered in my medical decision making (see chart for details).   Patient presents to the emergency department after a mechanical fall. Believes she tripped but is not entirely sure. Denies LOC. Patient has pain in her  right shoulder moderate aching type pain worse with any movement. We will obtain images of the right shoulder to further evaluate. We will also obtain CT scan of the head given patient states she hit her head during the fall. We will check basic labs and a urinalysis as the patient is completing a course of antibiotics for urinary tract infection. Currently the patient appears well, no distress, overall very well-appearing.   Patient has a distal right clavicle fracture.  Work-up otherwise nonrevealing.  Has not been able to provide a urine sample yet, has no urinary symptoms and the patient states she is ready to go home.  She will follow up with her doctor.  I did discuss for the clavicle fracture we will place the patient in a right shoulder immobilizer and have her follow-up with orthopedics in 1 week for recheck/reevaluation.  Patient has followed up with Dr. Mack Guise in the past.  Patient states she cannot take pain medication due to side effects.  She will take Tylenol at home.  Marian Meneely was evaluated in Emergency Department on 01/07/2020 for the symptoms described in the history of present illness. She was evaluated in the context of the global COVID-19 pandemic, which necessitated consideration that the patient might be at risk for infection with the SARS-CoV-2 virus that causes COVID-19. Institutional protocols and algorithms that pertain to the evaluation of patients at risk for COVID-19 are in a state of rapid change based on information released by regulatory bodies including the CDC and federal and state organizations. These policies and algorithms were followed during the patient's care in the ED.  ____________________________________________   FINAL CLINICAL IMPRESSION(S) / ED DIAGNOSES  Fall Distal clavicle fracture   Harvest Dark, MD 01/07/20 2005

## 2020-01-07 NOTE — Discharge Instructions (Addendum)
Please call the number provided for orthopedics to arrange a follow-up appointment in 1 week.  Please take Tylenol up to 1 g every 8 hours as needed for discomfort.  Return to the emergency department for any worsening pain, or any other symptom personally concerning to yourself.

## 2020-01-07 NOTE — ED Triage Notes (Signed)
Pt from via [facility]. C/O unwhitnessed mechanical fall. No LOC, pt hit head. Pt fell on R shoulder. Pt states "I layed on the floor for an hour".  Pt has chronic trach.  EMS VS: HR: 70, BP: 95/68

## 2020-01-15 ENCOUNTER — Inpatient Hospital Stay
Admission: EM | Admit: 2020-01-15 | Discharge: 2020-01-21 | DRG: 177 | Disposition: A | Discharge status: Home / Self Care | Payer: Medicare Other | Attending: Internal Medicine | Admitting: Internal Medicine

## 2020-01-15 ENCOUNTER — Emergency Department: Payer: Medicare Other

## 2020-01-15 ENCOUNTER — Telehealth: Payer: Self-pay | Admitting: Internal Medicine

## 2020-01-15 ENCOUNTER — Other Ambulatory Visit: Payer: Self-pay

## 2020-01-15 DIAGNOSIS — M25469 Effusion, unspecified knee: Secondary | ICD-10-CM

## 2020-01-15 DIAGNOSIS — F411 Generalized anxiety disorder: Secondary | ICD-10-CM | POA: Diagnosis present

## 2020-01-15 DIAGNOSIS — R443 Hallucinations, unspecified: Secondary | ICD-10-CM

## 2020-01-15 DIAGNOSIS — R4182 Altered mental status, unspecified: Secondary | ICD-10-CM | POA: Diagnosis present

## 2020-01-15 DIAGNOSIS — Z90710 Acquired absence of both cervix and uterus: Secondary | ICD-10-CM

## 2020-01-15 DIAGNOSIS — E1142 Type 2 diabetes mellitus with diabetic polyneuropathy: Secondary | ICD-10-CM | POA: Diagnosis present

## 2020-01-15 DIAGNOSIS — E785 Hyperlipidemia, unspecified: Secondary | ICD-10-CM | POA: Diagnosis present

## 2020-01-15 DIAGNOSIS — J690 Pneumonitis due to inhalation of food and vomit: Secondary | ICD-10-CM | POA: Diagnosis not present

## 2020-01-15 DIAGNOSIS — J189 Pneumonia, unspecified organism: Secondary | ICD-10-CM | POA: Diagnosis present

## 2020-01-15 DIAGNOSIS — G629 Polyneuropathy, unspecified: Secondary | ICD-10-CM

## 2020-01-15 DIAGNOSIS — Z923 Personal history of irradiation: Secondary | ICD-10-CM

## 2020-01-15 DIAGNOSIS — Z853 Personal history of malignant neoplasm of breast: Secondary | ICD-10-CM

## 2020-01-15 DIAGNOSIS — R059 Cough, unspecified: Secondary | ICD-10-CM

## 2020-01-15 DIAGNOSIS — Z9221 Personal history of antineoplastic chemotherapy: Secondary | ICD-10-CM

## 2020-01-15 DIAGNOSIS — Z79899 Other long term (current) drug therapy: Secondary | ICD-10-CM

## 2020-01-15 DIAGNOSIS — R262 Difficulty in walking, not elsewhere classified: Secondary | ICD-10-CM

## 2020-01-15 DIAGNOSIS — R269 Unspecified abnormalities of gait and mobility: Secondary | ICD-10-CM

## 2020-01-15 DIAGNOSIS — Z20822 Contact with and (suspected) exposure to covid-19: Secondary | ICD-10-CM | POA: Diagnosis present

## 2020-01-15 DIAGNOSIS — G9341 Metabolic encephalopathy: Secondary | ICD-10-CM | POA: Diagnosis present

## 2020-01-15 DIAGNOSIS — I10 Essential (primary) hypertension: Secondary | ICD-10-CM | POA: Diagnosis present

## 2020-01-15 DIAGNOSIS — E119 Type 2 diabetes mellitus without complications: Secondary | ICD-10-CM

## 2020-01-15 DIAGNOSIS — R7989 Other specified abnormal findings of blood chemistry: Secondary | ICD-10-CM

## 2020-01-15 DIAGNOSIS — M7989 Other specified soft tissue disorders: Secondary | ICD-10-CM

## 2020-01-15 DIAGNOSIS — F32A Depression, unspecified: Secondary | ICD-10-CM | POA: Diagnosis present

## 2020-01-15 DIAGNOSIS — R52 Pain, unspecified: Secondary | ICD-10-CM

## 2020-01-15 DIAGNOSIS — Z23 Encounter for immunization: Secondary | ICD-10-CM

## 2020-01-15 DIAGNOSIS — R410 Disorientation, unspecified: Secondary | ICD-10-CM | POA: Diagnosis not present

## 2020-01-15 DIAGNOSIS — E7800 Pure hypercholesterolemia, unspecified: Secondary | ICD-10-CM | POA: Diagnosis present

## 2020-01-15 DIAGNOSIS — I509 Heart failure, unspecified: Secondary | ICD-10-CM

## 2020-01-15 DIAGNOSIS — Z930 Tracheostomy status: Secondary | ICD-10-CM

## 2020-01-15 DIAGNOSIS — Z7984 Long term (current) use of oral hypoglycemic drugs: Secondary | ICD-10-CM

## 2020-01-15 DIAGNOSIS — K219 Gastro-esophageal reflux disease without esophagitis: Secondary | ICD-10-CM | POA: Diagnosis present

## 2020-01-15 DIAGNOSIS — I4891 Unspecified atrial fibrillation: Secondary | ICD-10-CM | POA: Diagnosis present

## 2020-01-15 DIAGNOSIS — G9340 Encephalopathy, unspecified: Secondary | ICD-10-CM | POA: Diagnosis present

## 2020-01-15 LAB — BLOOD GAS, VENOUS
Acid-Base Excess: 0.8 mmol/L (ref 0.0–2.0)
Bicarbonate: 27.1 mmol/L (ref 20.0–28.0)
O2 Saturation: 59.8 %
Patient temperature: 37
pCO2, Ven: 49 mmHg (ref 44.0–60.0)
pH, Ven: 7.35 (ref 7.250–7.430)
pO2, Ven: 33 mmHg (ref 32.0–45.0)

## 2020-01-15 LAB — COMPREHENSIVE METABOLIC PANEL
ALT: 21 U/L (ref 0–44)
AST: 26 U/L (ref 15–41)
Albumin: 3.8 g/dL (ref 3.5–5.0)
Alkaline Phosphatase: 62 U/L (ref 38–126)
Anion gap: 11 (ref 5–15)
BUN: 20 mg/dL (ref 8–23)
CO2: 23 mmol/L (ref 22–32)
Calcium: 9.4 mg/dL (ref 8.9–10.3)
Chloride: 104 mmol/L (ref 98–111)
Creatinine, Ser: 0.89 mg/dL (ref 0.44–1.00)
GFR, Estimated: >60 mL/min (ref >60)
Glucose, Bld: 183 mg/dL — ABNORMAL HIGH (ref 70–99)
Potassium: 4.4 mmol/L (ref 3.5–5.1)
Sodium: 138 mmol/L (ref 135–145)
Total Bilirubin: 0.6 mg/dL (ref 0.3–1.2)
Total Protein: 7.5 g/dL (ref 6.5–8.1)

## 2020-01-15 LAB — CBC
HCT: 40.7 % (ref 36.0–46.0)
Hemoglobin: 12.6 g/dL (ref 12.0–15.0)
MCH: 26.2 pg (ref 26.0–34.0)
MCHC: 31 g/dL (ref 30.0–36.0)
MCV: 84.6 fL (ref 80.0–100.0)
Platelets: 206 10*3/uL (ref 150–400)
RBC: 4.81 MIL/uL (ref 3.87–5.11)
RDW: 14.6 % (ref 11.5–15.5)
WBC: 8.7 10*3/uL (ref 4.0–10.5)
nRBC: 0 % (ref 0.0–0.2)

## 2020-01-15 LAB — URINALYSIS, COMPLETE (UACMP) WITH MICROSCOPIC
Bacteria, UA: NONE SEEN
Bilirubin Urine: NEGATIVE
Glucose, UA: NEGATIVE mg/dL
Ketones, ur: NEGATIVE mg/dL
Leukocytes,Ua: NEGATIVE
Nitrite: NEGATIVE
Protein, ur: 30 mg/dL — AB
Specific Gravity, Urine: 1.025 (ref 1.005–1.030)
pH: 5 (ref 5.0–8.0)

## 2020-01-15 LAB — TROPONIN I (HIGH SENSITIVITY)
Troponin I (High Sensitivity): 9 ng/L (ref <18)
Troponin I (High Sensitivity): 11 ng/L (ref <18)

## 2020-01-15 LAB — BRAIN NATRIURETIC PEPTIDE: B Natriuretic Peptide: 181.2 pg/mL — ABNORMAL HIGH (ref 0.0–100.0)

## 2020-01-15 LAB — RESP PANEL BY RT-PCR (FLU A&B, COVID) ARPGX2
Influenza A by PCR: NEGATIVE
Influenza B by PCR: NEGATIVE
SARS Coronavirus 2 by RT PCR: NEGATIVE

## 2020-01-15 LAB — PROCALCITONIN: Procalcitonin: <0.1 ng/mL

## 2020-01-15 MED ORDER — FLUCONAZOLE 50 MG PO TABS
150.0000 mg | ORAL_TABLET | ORAL | Status: DC
  Administered 2020-01-21: 150 mg via ORAL
  Filled 2020-01-15 (×3): qty 1

## 2020-01-15 MED ORDER — BUSPIRONE HCL 5 MG PO TABS
30.0000 mg | ORAL_TABLET | Freq: Two times a day (BID) | ORAL | Status: DC
  Administered 2020-01-16 (×3): 30 mg via ORAL
  Filled 2020-01-15 (×6): qty 6

## 2020-01-15 MED ORDER — ACETAMINOPHEN 325 MG PO TABS
650.0000 mg | ORAL_TABLET | Freq: Four times a day (QID) | ORAL | Status: DC | PRN
  Administered 2020-01-16 – 2020-01-20 (×2): 650 mg via ORAL
  Filled 2020-01-15 (×2): qty 2

## 2020-01-15 MED ORDER — ESCITALOPRAM OXALATE 10 MG PO TABS
20.0000 mg | ORAL_TABLET | Freq: Every day | ORAL | Status: DC
  Administered 2020-01-16: 10:00:00 20 mg via ORAL
  Filled 2020-01-15 (×2): qty 2

## 2020-01-15 MED ORDER — PRAVASTATIN SODIUM 10 MG PO TABS
10.0000 mg | ORAL_TABLET | Freq: Every day | ORAL | Status: DC
  Administered 2020-01-16: 17:00:00 10 mg via ORAL
  Filled 2020-01-15 (×2): qty 1

## 2020-01-15 MED ORDER — METOPROLOL TARTRATE 25 MG PO TABS
25.0000 mg | ORAL_TABLET | Freq: Every day | ORAL | Status: DC
  Administered 2020-01-16: 10:00:00 25 mg via ORAL
  Filled 2020-01-15: qty 1

## 2020-01-15 MED ORDER — ROPINIROLE HCL 1 MG PO TABS
0.5000 mg | ORAL_TABLET | Freq: Every day | ORAL | Status: DC
  Administered 2020-01-16 (×2): 0.5 mg via ORAL
  Filled 2020-01-15 (×2): qty 1

## 2020-01-15 MED ORDER — ENOXAPARIN SODIUM 40 MG/0.4ML ~~LOC~~ SOLN
40.0000 mg | SUBCUTANEOUS | Status: DC
  Administered 2020-01-16 – 2020-01-20 (×6): 40 mg via SUBCUTANEOUS
  Filled 2020-01-15 (×6): qty 0.4

## 2020-01-15 MED ORDER — ALLOPURINOL 100 MG PO TABS
100.0000 mg | ORAL_TABLET | Freq: Every day | ORAL | Status: DC
  Administered 2020-01-16: 100 mg via ORAL
  Filled 2020-01-15: qty 1

## 2020-01-15 MED ORDER — CALCIUM CARBONATE-VITAMIN D 500-200 MG-UNIT PO TABS
1.0000 | ORAL_TABLET | Freq: Every day | ORAL | Status: DC
  Administered 2020-01-16: 10:00:00 1 via ORAL
  Filled 2020-01-15: qty 1

## 2020-01-15 MED ORDER — ESCITALOPRAM OXALATE 10 MG PO TABS
10.0000 mg | ORAL_TABLET | Freq: Every day | ORAL | Status: DC
  Administered 2020-01-16: 10 mg via ORAL
  Filled 2020-01-15 (×2): qty 1

## 2020-01-15 NOTE — Telephone Encounter (Signed)
Called and spoke to patient.  Patient reports of extreme fatigue, confusion, hallucination and productive cough with clear mucus x3wk. Sob is baseline. Denied fever,chills or sweats. She does not have supplemental oxygen.  She has had both covid vaccines. She does get flu shot.  Not on any maintenance inhalers.  She had a fall last week. Sx were present prior to fall. She spoke with PCP and was prescribed pain medication.  ED visit on 01/07/2020 for fall.    Dr. Mortimer Fries, please advise.

## 2020-01-15 NOTE — Telephone Encounter (Signed)
Patient is aware of below recommendations and voiced her understanding. She stated that she would call back to schedule appointment with our office.  Nothing further needed at this time.

## 2020-01-15 NOTE — ED Provider Notes (Signed)
[facility] Emergency Department Provider Note   ____________________________________________   First MD Initiated Contact with Patient 01/15/20 1511     (approximate)  I have reviewed the triage vital signs and the nursing notes.   HISTORY  Chief Complaint Altered Mental Status   HPI Angela David is a 79 y.o. female who comes in with her husband.  She has had several falls resulting in rib fractures and a week ago a scapular fracture.  She has had waxing and waning mental status for about a month with even prior to that episodes of numbness and tingling in the hands and feet.  She occasionally sees things that are not there or will think that her husband is lying down he standing up.  She is sleeping a lot during the day.  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  Patient had a pneumonia previously with fatigue and confusion productive cough these improved after the pneumonia was treated.  They received the pamphlet from [facility] about multiple sclerosis center waiting to see a neurologist again about the possibility of having multiple sclerosis because of the weakness and tingling that she has had intermittently in the confusion.   Patient called Dr. Juliann Mule pulmonary about extreme fatigue confusion productive cough with clear mucus for 3 weeks.  She has not had any fever chills or sweats.  She does not have supplemental oxygen although she was using a ventilator at night.  I understand this is been [place].      Past Medical History:  Diagnosis Date  . Breast cancer ([place]) 2002   right breast chemo and radiation  . Cancer [facility]) 2002   right breast  . Diabetes mellitus without complication ([place])   . Diabetic peripheral neuropathy ([place]) 08/28/2016  . GERD (gastroesophageal reflux disease)   . Glaucoma   . High cholesterol   . History of colon polyps   . Peripheral neuropathy 08/28/2016  . Personal history of chemotherapy 2002   BREAST CA  .  Personal history of malignant neoplasm of breast   . Personal history of radiation therapy 2002   BREAST CA  . Vocal cord paralysis 2012    Patient Active Problem List   Diagnosis Date Noted  . 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 Drug-induced polyneuropathy ([place]) 05/05/2019  . 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 Weakness   . Altered mental status   . Hypoxia   . Acute respiratory failure with hypoxia and hypercapnia ([place]) 03/21/2019  . Hypotension 03/21/2019  . Acute encephalopathy 03/21/2019  . Multiple fractures of ribs, right side, initial encounter for closed fracture 12/25/2018  . Colon polyp 11/06/2018  . Esophageal dysphagia   . 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 Stricture and stenosis of esophagus   . Elevated CEA 09/02/2018  . Goals of care, counseling/discussion 09/02/2018  . Polyp of transverse colon 09/02/2018  . Foreign body in right foot 05/26/2018  . History of tracheostomy 04/30/2018  . Moderate episode of recurrent major depressive disorder ([place]) 04/03/2018  . Poor balance 04/03/2018  . Shoulder lesion, right 02/28/2017  . Cervical radiculopathy 09/28/2016  . Peripheral neuropathy 08/28/2016  . Diabetic peripheral neuropathy ([place]) 08/28/2016  . Leg weakness, bilateral 08/13/2016  . Gait abnormality 08/13/2016  . Carcinoma of overlapping sites of right breast in female, estrogen receptor negative ([place]) 12/27/2015  . Malignant neoplasm of breast ([place]) 03/28/2015  . 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 Hypercholesterolemia 03/28/2015  .  Recurrent major depressive disorder, in partial remission ([place]) 03/01/2015  . 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 Cephalalgia 09/15/2014  . Headache due to trauma 09/15/2014  . Osteopenia 01/07/2014  . Age-related osteoporosis without current pathological fracture 01/07/2014  . History of colonic polyps 12/14/2013  . History of breast cancer  11/26/2012  . H/O malignant neoplasm of breast 11/26/2012  . 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 Tracheostomy present ([place]) 12/06/2011  . Calcification of bronchus or trachea 01/16/2011  . 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 Anxiety state 12/19/2010  . Blood in sputum 12/19/2010  . HLD (hyperlipidemia) 12/19/2010  . Type 2 diabetes mellitus ([place]) 12/19/2010    Past Surgical History:  Procedure Laterality Date  . ABDOMINAL HYSTERECTOMY    . BREAST BIOPSY Right 2008   neg  . BREAST EXCISIONAL BIOPSY Right 2002   positive  . BREAST LUMPECTOMY Right 2002   chemo and radiation  . BREAST SURGERY Right 2002   LUMPECTOMY, radiation, chemotherapy  . CHOLECYSTECTOMY N/A 12/10/2017   Procedure: LAPAROSCOPIC CHOLECYSTECTOMY;  Surgeon: Benjamine Sprague, DO;  Location: [facility] ORS;  Service: General;  Laterality: N/A;  . COLON RESECTION Right 11/06/2018   Procedure: HAND ASSISTED LAPAROSCOPIC COLON RESECTION, RIGHT;  Surgeon: Jules Husbands, MD;  Location: [facility] ORS;  Service: General;  Laterality: Right;  . COLONOSCOPY  2009,12/30/2013  . COLONOSCOPY WITH PROPOFOL N/A 04/16/2018   Tubulovillous polyp proximal transverse colon, large sessile polyp, proximal transverse colon;  Surgeon: Robert Bellow, MD;  Location: [facility] ENDOSCOPY;  Service: Endoscopy;  Laterality: N/A;  . ESOPHAGOGASTRODUODENOSCOPY (EGD) WITH PROPOFOL N/A 10/14/2018   Procedure: ESOPHAGOGASTRODUODENOSCOPY (EGD) WITH PROPOFOL;  Surgeon: Lucilla Lame, MD;  Location: [facility] ENDOSCOPY;  Service: Endoscopy;  Laterality: N/A;  . HYDROCELE EXCISION / REPAIR    . TRACHEAL SURGERY  2013  . TRACHEOSTOMY N/A    Dr Kathyrn Sheriff    Prior to Admission medications   Medication Sig Start Date End Date Taking? Authorizing Provider  acetaminophen (TYLENOL) 325 MG tablet Take 2 tablets (650 mg total) by mouth every 6 (six) hours as needed for moderate pain or fever. 04/01/19   Tyler Pita, MD  Biotin 1000 MCG tablet Take 1,000 mcg by mouth daily.      [provider]  busPIRone (BUSPAR) 15 MG tablet Take 1 tablet (15 mg total) by mouth 2 (two) times daily. 04/01/19   Tyler Pita, MD  Calcium Carbonate-Vitamin D 600-400 MG-UNIT tablet Take 1 tablet by mouth daily.     [provider]  EPINEPHrine 0.3 mg/0.3 mL IJ SOAJ injection epinephrine 0.3 mg/0.3 mL injection, auto-injector Patient not taking: Reported on 12/08/2019 09/10/18   [provider]  escitalopram (LEXAPRO) 20 MG tablet Take 20 mg by mouth daily.    [provider]  estradiol (ESTRACE) 0.1 MG/GM vaginal cream estradiol 0.01% (0.1 mg/gram) vaginal cream 12/12/18   [provider]  feeding supplement, ENSURE ENLIVE, (ENSURE ENLIVE) LIQD Take 237 mLs by mouth 2 (two) times daily between meals. 04/02/19   Tyler Pita, MD  fluconazole (DIFLUCAN) 150 MG tablet Take 150 mg by mouth once a week.  Patient not taking: Reported on 12/08/2019 05/12/19   [provider]  folic acid (FOLVITE) 1 MG tablet Take 1 mg by mouth daily.    [provider]  gabapentin (NEURONTIN) 400 MG capsule Take 1 capsule (400 mg total) by mouth 3 (three) times daily. 04/01/19   Tyler Pita, MD  gabapentin (NEURONTIN) 600 MG tablet Take  600 mg by mouth 3 (three) times daily. 05/26/19   [provider]  glucose blood (CONTOUR NEXT TEST) test strip 2 (two) times daily. 04/20/19   [provider]  ipratropium-albuterol (DUONEB) 0.5-2.5 (3) MG/3ML SOLN Take 3 mLs by nebulization every 6 (six) hours as needed. 04/01/19   Tyler Pita, MD  lovastatin (MEVACOR) 20 MG tablet Take 20 mg by mouth at bedtime.    [provider]  meclizine (ANTIVERT) 25 MG tablet Take 25 mg by mouth 2 (two) times daily as needed for dizziness.  Patient not taking: Reported on 12/08/2019    [provider]  Melatonin 5 MG TABS Take 1 tablet (5 mg total) by mouth at bedtime. 04/01/19   Tyler Pita, MD  metFORMIN (GLUCOPHAGE) 1000  MG tablet Take 1,000 mg by mouth 2 (two) times daily as needed (for BS >110 in the morning and >150 in the evening).     [provider]  metoprolol tartrate (LOPRESSOR) 25 MG tablet Take 1 tablet (25 mg total) by mouth 2 (two) times daily. Patient taking differently: Take 25 mg by mouth daily.  04/01/19   Tyler Pita, MD  Nebulizers (COMPRESSOR/NEBULIZER) MISC Nebulizer with supplies, trach collar for med admin. 04/01/19   Tyler Pita, MD  neomycin-polymyxin b-dexamethasone (MAXITROL) 3.5-10000-0.1 SUSP  06/19/19   [provider]  ondansetron (ZOFRAN) 4 MG tablet Take 1 tablet (4 mg total) by mouth every 8 (eight) hours as needed for nausea or vomiting. Patient not taking: Reported on 12/08/2019 11/09/18   Caroleen Hamman F, MD  pantoprazole (PROTONIX) 40 MG tablet Take 40 mg by mouth daily.     [provider]  rOPINIRole (REQUIP) 0.5 MG tablet Take 0.5 mg by mouth at bedtime.  06/09/18   [provider]  sodium chloride 0.9 % nebulizer solution USE 5 ML BY NEBULIZTION AS NEEDED FOR WHEEZING 07/17/19   [provider]    Allergies Shellfish allergy, Sodium sulfite, Sulfa antibiotics, and Codeine  Family History  Problem Relation Age of Onset  . Cancer Father        bone cancer  . Cancer Sister        lymphoma  . Cancer Brother   . Breast cancer Neg Hx     Social History Social History   Tobacco Use  . Smoking status: Never Smoker  . Smokeless tobacco: Never Used  Vaping Use  . Vaping Use: Never used  Substance Use Topics  . Alcohol use: No  . Drug use: No    Review of Systems  Constitutional: No fever/chills Eyes: No visual changes. ENT: No sore throat. Cardiovascular: Denies chest pain. Respiratory: Denies shortness of breath. Gastrointestinal: No abdominal pain.  No nausea, no vomiting.  No diarrhea.  No constipation. Genitourinary: Negative for dysuria. Musculoskeletal: Negative for back pain. Skin: Negative for  rash. Neurological: Negative for headaches, focal weakness   ____________________________________________   PHYSICAL EXAM:  VITAL SIGNS: ED Triage Vitals  Enc Vitals Group     BP 01/15/20 1340 123/60     Pulse Rate 01/15/20 1340 75     Resp 01/15/20 1340 16     Temp 01/15/20 1340 98.4 F (36.9 C)     Temp src --      SpO2 01/15/20 1340 98 %     Weight 01/15/20 1341 155 lb (70.3 kg)     Height 01/15/20 1341 5\' 4"  (1.626 m)     Head Circumference --  Peak Flow --      Pain Score 01/15/20 1340 7     Pain Loc --      Pain Edu? --      Excl. in [place]? --     Constitutional: Alert and oriented. Well appearing and in no acute distress. Eyes: Conjunctivae are normal. PERRL. EOMI. Head: Atraumatic. Nose: No congestion/rhinnorhea. Mouth/Throat: Mucous membranes are moist.  Oropharynx non-erythematous. Neck: No stridor.  Trach in place  Cardiovascular: Normal rate, regular rhythm. Grossly normal heart sounds.  Good peripheral circulation. Respiratory: Normal respiratory effort.  No retractions. Lungs somewhat decreased on the left Gastrointestinal: Soft and nontender. No distention. No abdominal bruits. No CVA tenderness. Musculoskeletal: No lower extremity tenderness bilateral lower leg edema.   Neurologic:  Normal speech and language. No gross focal neurologic deficits are appreciated. Skin:  Skin is warm, dry and intact. No rash noted.   ____________________________________________   LABS (all labs ordered are listed, but only abnormal results are displayed)  Labs Reviewed  COMPREHENSIVE METABOLIC PANEL - Abnormal; Notable for the following components:      Result Value   Glucose, Bld 183 (*)    All other components within normal limits  URINALYSIS, COMPLETE (UACMP) WITH MICROSCOPIC - Abnormal; Notable for the following components:   Color, Urine YELLOW (*)    APPearance HAZY (*)    Hgb urine dipstick SMALL (*)    Protein, ur 30 (*)    All other components within  normal limits  BRAIN NATRIURETIC PEPTIDE - Abnormal; Notable for the following components:   B Natriuretic Peptide 181.2 (*)    All other components within normal limits  RESP PANEL BY RT-PCR (FLU A&B, COVID) ARPGX2  CBC  BLOOD GAS, VENOUS  PROCALCITONIN  TROPONIN I (HIGH SENSITIVITY)  TROPONIN I (HIGH SENSITIVITY)   ____________________________________________  EKG  EKG read interpreted by me shows normal sinus rhythm rate of 78 normal axis some downsloping ST segment in lead III and F ____________________________________________  RADIOLOGY Gertha Calkin, personally viewed and evaluated these images (plain radiographs) as part of my medical decision making, as well as reviewing the written report by the radiologist.  ED MD interpretation:  Official radiology report(s): DG Chest 2 View  Result Date: 01/15/2020 CLINICAL DATA:  Change in mental status EXAM: CHEST - 2 VIEW COMPARISON:  Jul 20, 2019 FINDINGS: The heart size and mediastinal contours are within normal limits. Tracheostomy tube is seen at the level of the clavicular heads. Again noted is elevation of the right hemidiaphragm. There is blunting of the left costophrenic angle which be due to combination of effusion and airspace opacity. IMPRESSION: Left pleural effusion with adjacent patchy airspace opacity which could be due to atelectasis and/or infectious etiology. Electronically Signed   By: Prudencio Pair M.D.   On: 01/15/2020 16:08   CT Head Wo Contrast  Result Date: 01/15/2020 CLINICAL DATA:  Dizziness, nonspecific. Mental status change, unknown cause. Additional history provided: Hallucinations for 1 week. EXAM: CT HEAD WITHOUT CONTRAST TECHNIQUE: Contiguous axial images were obtained from the base of the skull through the vertex without intravenous contrast. COMPARISON:  Head CT 01/07/2020.  Brain MRI 03/21/2019. FINDINGS: Brain: Mild generalized cerebral atrophy. Advanced ill-defined hypoattenuation within the  cerebral white matter is nonspecific, but compatible with chronic small vessel ischemic disease. There is no acute intracranial hemorrhage. No demarcated cortical infarct. No extra-axial fluid collection. No evidence of intracranial mass. No midline shift. Vascular: No hyperdense vessel.  Atherosclerotic calcifications. Skull: Normal. Negative  for fracture or focal lesion. Sinuses/Orbits: Visualized orbits show no acute finding. Small volume frothy secretions within the right sphenoid sinus. Tiny right maxillary sinus mucous retention cyst. Trace fluid within the left mastoid air cells. IMPRESSION: No evidence of acute intracranial abnormality. Mild cerebral atrophy with advanced chronic small vessel ischemic disease, stable as compared to the head CT of 01/07/2020. Right sphenoid sinusitis. Electronically Signed   By: Kellie Simmering DO   On: 01/15/2020 16:40    ____________________________________________   PROCEDURES  Procedure(s) performed (including Critical Care):  Procedures   ____________________________________________   INITIAL IMPRESSION / ASSESSMENT AND PLAN / ED COURSE Patient and her husband report that she is seeing people that are not there at home.  Additionally she is intermittently seeing things sideways like the TV seems to be sideways and her husband seems to be horizontal instead of standing.  This seems to be getting worse.  Patient does have a new onset effusion and some consolidation.  This may be congestive heart failure as her SARS influenza and procalcitonin are all negative.  She is not hypoxic but if this congestive heart failure is new as it seems to be it may warrant inpatient evaluation.  Additionally patient reportedly is not safe walking at home.  She has fallen several times in the last several weeks breaking her scapula most recently.  Husband is very reluctant to take her home and patient does not want to go home herself.               ____________________________________________   FINAL CLINICAL IMPRESSION(S) / ED DIAGNOSES  Final diagnoses:  Hallucination  Acute congestive heart failure, unspecified heart failure type ([place])  Difficulty walking     ED Discharge Orders    None      *Please note:  Saya Mccoll was evaluated in Emergency Department on 01/15/2020 for the symptoms described in the history of present illness. She was evaluated in the context of the global COVID-19 pandemic, which necessitated consideration that the patient might be at risk for infection with the SARS-CoV-2 virus that causes COVID-19. Institutional protocols and algorithms that pertain to the evaluation of patients at risk for COVID-19 are in a state of rapid change based on information released by regulatory bodies including the CDC and federal and state organizations. These policies and algorithms were followed during the patient's care in the ED.  Some ED evaluations and interventions may be delayed as a result of limited staffing during and the pandemic.*   Note:  This document was prepared using Dragon voice recognition software and may include unintentional dictation errors.    Nena Polio, MD 01/15/20 [phone]

## 2020-01-15 NOTE — H&P (Signed)
History and Physical   Angela David KDT:[id number] DOB: 09-Jul-1940 DOA: 01/15/2020  PCP: Iva Boop, MD  Outpatient Specialists: Dr. Flora Lipps, pulmonologist  Patient coming from: home   I have personally briefly reviewed patient's old medical records in [place].  Chief Concern: mental status change   HPI: Angela David is a 79 y.o. female with medical history significant for hypertension, diabetes, history of trachea stenosis status post tracheostomy presented to the emergency department for chief concerns of mental status change, weakness, difficulty ambulating times several days.  She reports that she did not recognize her husband, when he was standing up she would see him laying down, hallucinating, often time did not know where she was, had difficulty walking, unsteady and imbalanced feeling.  She denies changes to medications, diet, fever, chills, headache, chest pain, shortness of breath, abdominal pain, nausea, vomiting, dysuria, hematuria, dysphagia, diarrhea.  At bedside she was awake and alert and oriented x4.  She states that during these episodes of mental status change, she states that she is sometimes aware that she is experiencing these phenomenons.  Chart review: On 01/09/2020, nurse tried notes states that niece of patient calling and states that patient's husband called stating patient was confused and did not know who her husband was on the day following fall and head injury.  ED Course: discussed with ED provider.   Review of Systems: As per HPI otherwise 10 point review of systems negative.   Assessment/Plan  Active Problems:   Anxiety state   Gait abnormality   Altered mental status   Polypharmacy   Mental status change differential diagnosis include endocrine, metabolic, vitamin deficiency, polypharmacy versus infectious etiology -CT of the head without contrast was read as negative for evidence of acute intracranial  abnormality.  Mild cerebral atrophy with advanced chronic small vessel ischemic disease, stable compared to CT on 01/07/2020.  Right sphenoid sinusitis. -Checking TSH, vitamin D, B12, ammonia level -CXR read as Left pleural effusion with adjacent patchy airspace opacity-possible infectious etiology -Holding home gabapentin 600 mg TID and ropinirole 0.5 mg  Radiological read as possible left lower lobe pneumonia-pro-Cal was negative, no leukocytosis, negative for fever, however given presentation of mental status change we will treat  -Compared to chest x-ray on 07/20/2019, I can appreciate change in the left lower lobe -Azithromycin IV, ceftriaxone IV  Lower leg swelling -Patient states that she was told fractured her legs, however she has been ambulating and at times endorses unsteadiness on her feet -Ultrasound lower extremity to assess for DVT ordered and negative -X-ray tibia fibula bilateral ordered as I was not able to find history of fracture of her legs on E HR  Polypharmacy-holding home gabapentin 600 mg 3 times daily and ropinirole History of trachea stenosis status post tracheostomy-appears stable Anxiety/depression M home Lexapro and buspirone  DVT prophylaxis: Enoxaparin Code Status: Full code Diet: Heart diet Family Communication: No Disposition Plan: Pending clinical course Consults called: None at this time Admission status: Observation  Past Medical History:  Diagnosis Date  . Breast cancer ([place]) 2002   right breast chemo and radiation  . Cancer [facility]) 2002   right breast  . Diabetes mellitus without complication ([place])   . Diabetic peripheral neuropathy ([place]) 08/28/2016  . GERD (gastroesophageal reflux disease)   . Glaucoma   . High cholesterol   . History of colon polyps   . Peripheral neuropathy 08/28/2016  . Personal history of chemotherapy 2002   BREAST CA  . Personal  history of malignant neoplasm of breast   . Personal history of radiation therapy 2002    BREAST CA  . Vocal cord paralysis 2012   Past Surgical History:  Procedure Laterality Date  . ABDOMINAL HYSTERECTOMY    . BREAST BIOPSY Right 2008   neg  . BREAST EXCISIONAL BIOPSY Right 2002   positive  . BREAST LUMPECTOMY Right 2002   chemo and radiation  . BREAST SURGERY Right 2002   LUMPECTOMY, radiation, chemotherapy  . CHOLECYSTECTOMY N/A 12/10/2017   Procedure: LAPAROSCOPIC CHOLECYSTECTOMY;  Surgeon: Benjamine Sprague, DO;  Location: [facility] ORS;  Service: General;  Laterality: N/A;  . COLON RESECTION Right 11/06/2018   Procedure: HAND ASSISTED LAPAROSCOPIC COLON RESECTION, RIGHT;  Surgeon: Jules Husbands, MD;  Location: [facility] ORS;  Service: General;  Laterality: Right;  . COLONOSCOPY  2009,12/30/2013  . COLONOSCOPY WITH PROPOFOL N/A 04/16/2018   Tubulovillous polyp proximal transverse colon, large sessile polyp, proximal transverse colon;  Surgeon: Robert Bellow, MD;  Location: [facility] ENDOSCOPY;  Service: Endoscopy;  Laterality: N/A;  . ESOPHAGOGASTRODUODENOSCOPY (EGD) WITH PROPOFOL N/A 10/14/2018   Procedure: ESOPHAGOGASTRODUODENOSCOPY (EGD) WITH PROPOFOL;  Surgeon: Lucilla Lame, MD;  Location: [facility] ENDOSCOPY;  Service: Endoscopy;  Laterality: N/A;  . HYDROCELE EXCISION / REPAIR    . TRACHEAL SURGERY  2013  . TRACHEOSTOMY N/A    Dr Kathyrn Sheriff   Social History:  reports that she has never smoked. She has never used smokeless tobacco. She reports that she does not drink alcohol and does not use drugs.  Allergies  Allergen Reactions  . Shellfish Allergy Shortness Of Breath    respitatory distress   . Sodium Sulfite Shortness Of Breath    Severe Congestion that creates inability to breath  . Sulfa Antibiotics Other (See Comments) and Shortness Of Breath    Other reaction(s): OTHER Other reaction(s): Other (See Comments) respitatory distress  Other reaction(s): OTHER respitatory distress  Other reaction(s): OTHER Other reaction(s): Other (See Comments) respitatory distress   respitatory distress   . Codeine Other (See Comments)    Per patient "it kept her up all night"   Family History  Problem Relation Age of Onset  . Cancer Father        bone cancer  . Cancer Sister        lymphoma  . Cancer Brother   . Breast cancer Neg Hx    Family history: Family history reviewed and not pertinent  Prior to Admission medications   Medication Sig Start Date End Date Taking? Authorizing Provider  acetaminophen (TYLENOL) 325 MG tablet Take 2 tablets (650 mg total) by mouth every 6 (six) hours as needed for moderate pain or fever. 04/01/19  Yes Tyler Pita, MD  allopurinol (ZYLOPRIM) 100 MG tablet Take 100 mg by mouth daily.   Yes [provider]  busPIRone (BUSPAR) 15 MG tablet Take 30 mg by mouth 2 (two) times daily.   Yes [provider]  Calcium Carbonate-Vitamin D 600-400 MG-UNIT tablet Take 1 tablet by mouth daily.    Yes [provider]  escitalopram (LEXAPRO) 10 MG tablet Take 10 mg by mouth daily. (take with 20mg  tablet to equal 30mg  total daily)   Yes [provider]  escitalopram (LEXAPRO) 20 MG tablet Take 20 mg by mouth daily. (take with 10mg  tablet to equal 30mg  total daily)   Yes [provider]  fluconazole (DIFLUCAN) 150 MG tablet Take 150 mg by mouth 2 (two) times a week.   Yes [provider]  folic acid (FOLVITE) 1 MG tablet Take 1 mg by mouth daily.   Yes [provider]  gabapentin (NEURONTIN) 600 MG tablet Take 600 mg by mouth 3 (three) times daily. 05/26/19  Yes [provider]  lovastatin (MEVACOR) 20 MG tablet Take 20 mg by mouth at bedtime.   Yes [provider]  Melatonin 5 MG TABS Take 1 tablet (5 mg total) by mouth at bedtime. 04/01/19  Yes Tyler Pita, MD  meloxicam (MOBIC) 15 MG tablet Take 15 mg by mouth daily. 01/01/20  Yes [provider]  metFORMIN (GLUCOPHAGE) 1000 MG tablet Take 500 mg by mouth 2 (two) times daily.    Yes [provider]  metoprolol tartrate (LOPRESSOR) 25 MG tablet Take 1 tablet (25 mg total) by mouth 2 (two) times daily. Patient taking differently: Take 25 mg by mouth daily.  04/01/19  Yes Tyler Pita, MD  Nebulizers (COMPRESSOR/NEBULIZER) MISC Nebulizer with supplies, trach collar for med admin. 04/01/19  Yes Tyler Pita, MD  pantoprazole (PROTONIX) 40 MG tablet Take 40 mg by mouth daily.    Yes [provider]  rOPINIRole (REQUIP) 0.5 MG tablet Take 0.5 mg by mouth at bedtime.  06/09/18  Yes [provider]  sodium chloride 0.9 % nebulizer solution Take 3 mLs by nebulization as needed for wheezing.  07/17/19  Yes [provider]   Physical Exam: Vitals:   01/15/20 2200 01/15/20 2230 01/15/20 2300 01/15/20 2352  BP: (!) 143/67 (!) 130/56 126/73 (!) 102/91  Pulse: 79  76 79  Resp: (!) 22 15 19 18   Temp:    98.6 F (37 C)  TempSrc:    Oral  SpO2: 95%  96% 100%  Weight:      Height:       Constitutional: appears age appropriate, NAD, calm, comfortable Eyes: PERRL, lids and conjunctivae normal ENMT: Mucous membranes are moist. Posterior pharynx clear of any exudate or lesions. chornic trach number 4, no inner cannula in place, Age-appropriate dentition. Hearing appropriate Neck: normal, supple, no masses, no thyromegaly Respiratory: clear to auscultation bilaterally, no wheezing, no crackles. Normal respiratory effort. No accessory muscle use.  Cardiovascular: Regular rate and rhythm, no murmurs / rubs / gallops. No extremity edema. 2+ pedal pulses. No carotid bruits.  Abdomen: no tenderness, no masses palpated, no hepatosplenomegaly. Bowel sounds positive.  Musculoskeletal: no clubbing / cyanosis. No joint deformity upper and lower extremities. Good ROM, no contractures, no atrophy. Normal muscle tone.  Skin: no rashes, lesions, ulcers. No induration Neurologic: CN 2-12 grossly intact. Sensation intact. Strength 5/5 in all 4.  Psychiatric: Normal  judgment and insight. Alert and oriented x 3. Normal mood.   EKG: Independently reviewed, showing normal sinus rhythm rate of 74  Chest x-ray on Admission: Personally reviewed and I agree with radiologist reading as below.  DG Chest 2 View  Result Date: 01/15/2020 CLINICAL DATA:  Change in mental status EXAM: CHEST - 2 VIEW COMPARISON:  Jul 20, 2019 FINDINGS: The heart size and mediastinal contours are within normal limits. Tracheostomy tube is seen at the level of the clavicular heads. Again noted is elevation of the right hemidiaphragm. There is blunting of the left costophrenic angle which be due to combination of effusion and airspace opacity. IMPRESSION: Left pleural effusion with adjacent patchy airspace opacity which could be due to atelectasis and/or infectious etiology. Electronically Signed   By: Prudencio Pair M.D.   On: 01/15/2020 16:08   CT Head Wo Contrast  Result Date: 01/15/2020 CLINICAL DATA:  Dizziness, nonspecific. Mental status change, unknown cause. Additional history provided: Hallucinations for 1 week. EXAM: CT HEAD WITHOUT CONTRAST TECHNIQUE: Contiguous axial images were obtained from the base of the skull through the vertex without intravenous contrast. COMPARISON:  Head CT 01/07/2020.  Brain MRI 03/21/2019. FINDINGS: Brain: Mild generalized cerebral atrophy. Advanced ill-defined hypoattenuation within the cerebral white matter is nonspecific, but compatible with chronic small vessel ischemic disease. There is no acute intracranial hemorrhage. No demarcated cortical infarct. No extra-axial fluid collection. No evidence of intracranial mass. No midline shift. Vascular: No hyperdense vessel.  Atherosclerotic calcifications. Skull: Normal. Negative for fracture or focal lesion. Sinuses/Orbits: Visualized orbits show no acute finding. Small volume frothy secretions within the right sphenoid sinus. Tiny right maxillary sinus mucous retention cyst. Trace fluid within the left mastoid  air cells. IMPRESSION: No evidence of acute intracranial abnormality. Mild cerebral atrophy with advanced chronic small vessel ischemic disease, stable as compared to the head CT of 01/07/2020. Right sphenoid sinusitis. Electronically Signed   By: Kellie Simmering DO   On: 01/15/2020 16:40   US Venous Img Lower Bilateral (DVT)  Result Date: 01/16/2020 CLINICAL DATA:  Initial evaluation for lower extremity swelling. EXAM: BILATERAL LOWER EXTREMITY VENOUS DOPPLER ULTRASOUND TECHNIQUE: Gray-scale sonography with graded compression, as well as color Doppler and duplex ultrasound were performed to evaluate the lower extremity deep venous systems from the level of the common femoral vein and including the common femoral, femoral, profunda femoral, popliteal and calf veins including the posterior tibial, peroneal and gastrocnemius veins when visible. The superficial great saphenous vein was also interrogated. Spectral Doppler was utilized to evaluate flow at rest and with distal augmentation maneuvers in the common femoral, femoral and popliteal veins. COMPARISON:  None. FINDINGS: RIGHT LOWER EXTREMITY Common Femoral Vein: No evidence of thrombus. Normal compressibility, respiratory phasicity and response to augmentation. Saphenofemoral Junction: No evidence of thrombus. Normal compressibility and flow on color Doppler imaging. Profunda Femoral Vein: No evidence of thrombus. Normal compressibility and flow on color Doppler imaging. Femoral Vein: No evidence of thrombus. Normal compressibility, respiratory phasicity and response to augmentation. Popliteal Vein: No evidence of thrombus. Normal compressibility, respiratory phasicity and response to augmentation. Calf Veins: No evidence of thrombus. Normal compressibility and flow on color Doppler imaging. Superficial Great Saphenous Vein: No evidence of thrombus. Normal compressibility. Venous Reflux:  None. Other Findings:  None. LEFT LOWER EXTREMITY Common Femoral Vein: No  evidence of thrombus. Normal compressibility, respiratory phasicity and response to augmentation. Saphenofemoral Junction: No evidence of thrombus. Normal compressibility and flow on color Doppler imaging. Profunda Femoral Vein: No evidence of thrombus. Normal compressibility and flow on color Doppler imaging. Femoral Vein: No evidence of thrombus. Normal compressibility, respiratory phasicity and response to augmentation. Popliteal Vein: No evidence of thrombus. Normal compressibility, respiratory phasicity and response to augmentation. Calf Veins: No evidence of thrombus. Normal compressibility and flow on color Doppler imaging. Superficial Great Saphenous Vein: No evidence of thrombus. Normal compressibility. Venous Reflux:  None. Other Findings:  None. IMPRESSION: No evidence of deep venous thrombosis in either lower extremity. Electronically Signed   By: Jeannine Boga M.D.   On: 01/16/2020 01:32   Labs on Admission: I have personally reviewed following labs  CBC: Recent Labs  Lab 01/15/20 1343  WBC 8.7  HGB 12.6  HCT 40.7  MCV 84.6  PLT 428   Basic Metabolic Panel: Recent Labs  Lab 01/15/20 1343  NA 138  K 4.4  CL 104  CO2  23  GLUCOSE 183*  BUN 20  CREATININE 0.89  CALCIUM 9.4   GFR: Estimated Creatinine Clearance: 50.9 mL/min (by C-G formula based on SCr of 0.89 mg/dL). Liver Function Tests: Recent Labs  Lab 01/15/20 1343  AST 26  ALT 21  ALKPHOS 62  BILITOT 0.6  PROT 7.5  ALBUMIN 3.8   Urine analysis:    Component Value Date/Time   COLORURINE YELLOW (A) 01/15/2020 1405   APPEARANCEUR HAZY (A) 01/15/2020 1405   LABSPEC 1.025 01/15/2020 1405   PHURINE 5.0 01/15/2020 1405   GLUCOSEU NEGATIVE 01/15/2020 1405   HGBUR SMALL (A) 01/15/2020 1405   BILIRUBINUR NEGATIVE 01/15/2020 [place] 01/15/2020 1405   PROTEINUR 30 (A) 01/15/2020 1405   UROBILINOGEN 2.0 (H) 11/18/2010 1535   NITRITE NEGATIVE 01/15/2020 1405   LEUKOCYTESUR NEGATIVE  01/15/2020 1405   Beau [place] N Mariafernanda Hendricksen D.O. [facility]  If 12AM-7AM, please contact overnight-coverage provider If 7AM-7PM, please contact day coverage provider [email or web address]  01/16/2020, 2:43 AM

## 2020-01-15 NOTE — Telephone Encounter (Signed)
HIGH RECOMMENDATION TO GO TO ER, MAY ALSO SEE NEXT AVAILABLE PROVIDER IN CLINIC WHEN AVAILABLE

## 2020-01-15 NOTE — ED Triage Notes (Signed)
PT to ED via [facility] with her husband. PT has been having hallucinations x1 week. Per husband, she has a hx of pneumonia and elevated Co2 levels and thinks this may be the same thing. Also states pt has been unbalanced x1 week. PT alert.

## 2020-01-15 NOTE — Progress Notes (Signed)
Patient is a chronic trach. On room air. Has been on vent at night at home in the past but has now been discontinued off such per patient. Patient has a number 4 trach with no inner cannula as she states she can not breath with the inner cannula in. She states she has been doing well off and on. Vital good at this time. Will continue to monitor

## 2020-01-15 NOTE — ED Notes (Signed)
Report given to 1C RN 

## 2020-01-16 ENCOUNTER — Observation Stay: Payer: Medicare Other

## 2020-01-16 ENCOUNTER — Inpatient Hospital Stay: Payer: Medicare Other

## 2020-01-16 DIAGNOSIS — Z923 Personal history of irradiation: Secondary | ICD-10-CM | POA: Diagnosis not present

## 2020-01-16 DIAGNOSIS — E1142 Type 2 diabetes mellitus with diabetic polyneuropathy: Secondary | ICD-10-CM | POA: Diagnosis present

## 2020-01-16 DIAGNOSIS — Z7189 Other specified counseling: Secondary | ICD-10-CM | POA: Diagnosis not present

## 2020-01-16 DIAGNOSIS — Z7984 Long term (current) use of oral hypoglycemic drugs: Secondary | ICD-10-CM | POA: Diagnosis not present

## 2020-01-16 DIAGNOSIS — Z515 Encounter for palliative care: Secondary | ICD-10-CM | POA: Diagnosis not present

## 2020-01-16 DIAGNOSIS — Z853 Personal history of malignant neoplasm of breast: Secondary | ICD-10-CM | POA: Diagnosis not present

## 2020-01-16 DIAGNOSIS — J189 Pneumonia, unspecified organism: Secondary | ICD-10-CM | POA: Diagnosis not present

## 2020-01-16 DIAGNOSIS — Z79899 Other long term (current) drug therapy: Secondary | ICD-10-CM

## 2020-01-16 DIAGNOSIS — I10 Essential (primary) hypertension: Secondary | ICD-10-CM | POA: Diagnosis present

## 2020-01-16 DIAGNOSIS — K219 Gastro-esophageal reflux disease without esophagitis: Secondary | ICD-10-CM | POA: Diagnosis present

## 2020-01-16 DIAGNOSIS — G9340 Encephalopathy, unspecified: Secondary | ICD-10-CM | POA: Diagnosis not present

## 2020-01-16 DIAGNOSIS — Z90710 Acquired absence of both cervix and uterus: Secondary | ICD-10-CM | POA: Diagnosis not present

## 2020-01-16 DIAGNOSIS — G9341 Metabolic encephalopathy: Secondary | ICD-10-CM | POA: Diagnosis present

## 2020-01-16 DIAGNOSIS — R4182 Altered mental status, unspecified: Secondary | ICD-10-CM | POA: Diagnosis present

## 2020-01-16 DIAGNOSIS — E7800 Pure hypercholesterolemia, unspecified: Secondary | ICD-10-CM | POA: Diagnosis present

## 2020-01-16 DIAGNOSIS — I4891 Unspecified atrial fibrillation: Secondary | ICD-10-CM | POA: Diagnosis present

## 2020-01-16 DIAGNOSIS — Z23 Encounter for immunization: Secondary | ICD-10-CM | POA: Diagnosis present

## 2020-01-16 DIAGNOSIS — Z20822 Contact with and (suspected) exposure to covid-19: Secondary | ICD-10-CM | POA: Diagnosis present

## 2020-01-16 DIAGNOSIS — J690 Pneumonitis due to inhalation of food and vomit: Secondary | ICD-10-CM | POA: Diagnosis present

## 2020-01-16 DIAGNOSIS — F411 Generalized anxiety disorder: Secondary | ICD-10-CM | POA: Diagnosis present

## 2020-01-16 DIAGNOSIS — Z9221 Personal history of antineoplastic chemotherapy: Secondary | ICD-10-CM | POA: Diagnosis not present

## 2020-01-16 DIAGNOSIS — Z930 Tracheostomy status: Secondary | ICD-10-CM | POA: Diagnosis not present

## 2020-01-16 DIAGNOSIS — F32A Depression, unspecified: Secondary | ICD-10-CM | POA: Diagnosis present

## 2020-01-16 DIAGNOSIS — E785 Hyperlipidemia, unspecified: Secondary | ICD-10-CM | POA: Diagnosis present

## 2020-01-16 LAB — BASIC METABOLIC PANEL
Anion gap: 8 (ref 5–15)
BUN: 17 mg/dL (ref 8–23)
CO2: 26 mmol/L (ref 22–32)
Calcium: 9 mg/dL (ref 8.9–10.3)
Chloride: 102 mmol/L (ref 98–111)
Creatinine, Ser: 0.91 mg/dL (ref 0.44–1.00)
GFR, Estimated: >60 mL/min (ref >60)
Glucose, Bld: 138 mg/dL — ABNORMAL HIGH (ref 70–99)
Potassium: 3.9 mmol/L (ref 3.5–5.1)
Sodium: 136 mmol/L (ref 135–145)

## 2020-01-16 LAB — CBC
HCT: 35.6 % — ABNORMAL LOW (ref 36.0–46.0)
Hemoglobin: 11.3 g/dL — ABNORMAL LOW (ref 12.0–15.0)
MCH: 25.9 pg — ABNORMAL LOW (ref 26.0–34.0)
MCHC: 31.7 g/dL (ref 30.0–36.0)
MCV: 81.7 fL (ref 80.0–100.0)
Platelets: 201 10*3/uL (ref 150–400)
RBC: 4.36 MIL/uL (ref 3.87–5.11)
RDW: 14.4 % (ref 11.5–15.5)
WBC: 7.4 10*3/uL (ref 4.0–10.5)
nRBC: 0 % (ref 0.0–0.2)

## 2020-01-16 LAB — TSH: TSH: 2.454 u[IU]/mL (ref 0.350–4.500)

## 2020-01-16 LAB — VITAMIN D 25 HYDROXY (VIT D DEFICIENCY, FRACTURES): Vit D, 25-Hydroxy: 41.37 ng/mL (ref 30–100)

## 2020-01-16 LAB — AMMONIA: Ammonia: 21 umol/L (ref 9–35)

## 2020-01-16 LAB — VITAMIN B12: Vitamin B-12: 1970 pg/mL — ABNORMAL HIGH (ref 180–914)

## 2020-01-16 LAB — FIBRIN DERIVATIVES D-DIMER (ARMC ONLY): Fibrin derivatives D-dimer (ARMC): 599.11 ng/mL (FEU) — ABNORMAL HIGH (ref 0.00–499.00)

## 2020-01-16 MED ORDER — SODIUM CHLORIDE 0.9 % IV SOLN
1.0000 g | INTRAVENOUS | Status: DC
  Administered 2020-01-16 – 2020-01-18 (×3): 1 g via INTRAVENOUS
  Filled 2020-01-16 (×3): qty 10
  Filled 2020-01-16: qty 1

## 2020-01-16 MED ORDER — FOLIC ACID 1 MG PO TABS
1.0000 mg | ORAL_TABLET | Freq: Every day | ORAL | Status: DC
  Administered 2020-01-16: 1 mg via ORAL
  Filled 2020-01-16: qty 1

## 2020-01-16 MED ORDER — PANTOPRAZOLE SODIUM 40 MG PO TBEC
40.0000 mg | DELAYED_RELEASE_TABLET | Freq: Every day | ORAL | Status: DC
  Administered 2020-01-16: 17:00:00 40 mg via ORAL
  Filled 2020-01-16: qty 1

## 2020-01-16 MED ORDER — POLYVINYL ALCOHOL 1.4 % OP SOLN
1.0000 [drp] | Freq: Four times a day (QID) | OPHTHALMIC | Status: DC | PRN
  Filled 2020-01-16: qty 15

## 2020-01-16 MED ORDER — MELATONIN 5 MG PO TABS
5.0000 mg | ORAL_TABLET | Freq: Every day | ORAL | Status: DC
  Administered 2020-01-16: 5 mg via ORAL
  Filled 2020-01-16 (×2): qty 1

## 2020-01-16 MED ORDER — PNEUMOCOCCAL VAC POLYVALENT 25 MCG/0.5ML IJ INJ: 0.5000 mL | INJECTION | INTRAMUSCULAR | Status: DC

## 2020-01-16 MED ORDER — SODIUM CHLORIDE 0.9 % IV SOLN
500.0000 mg | INTRAVENOUS | Status: DC
  Administered 2020-01-16: 06:00:00 500 mg via INTRAVENOUS
  Filled 2020-01-16 (×4): qty 500

## 2020-01-16 NOTE — Progress Notes (Signed)
Patient doing well at this time. On room air. Has not c/o diff breathing or plugging issues today. Doesn't need suctioning. Trach collar available if needed. Room air saturation noted mid 90's. Trach secure. Clean and dry. Supplies avail. Will continue to monitor

## 2020-01-16 NOTE — Plan of Care (Signed)
  Problem: Education: Goal: Knowledge of General Education information will improve Description Including pain rating scale, medication(s)/side effects and non-pharmacologic comfort measures Outcome: Progressing   

## 2020-01-16 NOTE — Progress Notes (Signed)
Placed patient on 28% trach collar for humdification purposes only. Patient felt as if she had plug. RN attempted suction but stated airway was very dry. Room air o2 saturation noted at 95. BBS clear. Patient tolerated interventions well. Will continue to monitor.

## 2020-01-16 NOTE — Progress Notes (Signed)
Patient had an episode of confusion, trying to get out of bed, looking for dog and unsure of where she is.

## 2020-01-16 NOTE — Hospital Course (Addendum)
Angela David is a 79 y.o. female with medical history significant for hypertension, diabetes, history of trachea stenosis status post tracheostomy who presented to the ED on 01/15/20 with mental status changes with disorientation, not recognizing family, generalized weakness for several days.    Evaluation in the ED was mostly unrevealing except for possible right lower lobe pneumonia.  Patient improving with IV antibiotics.  However, patient is without respiratory symptoms, trach secretions or leukocytosis.  Procalcitonin normal. She does, however, report history of what sounds like aspiration at times.  History of prior admission in January this year for AMS and found hypercapneic.  Pt says her confusion and weakness this time resembles how she felt back then.  VBG obtained in the ED was normal.  Ammonia level normal.  UA negative.  CT head negative.   11/20 - admit to inpatient given ongoing hallucinations today.

## 2020-01-16 NOTE — Plan of Care (Signed)
  Problem: Education: Goal: Knowledge of General Education information will improve Description: Including pain rating scale, medication(s)/side effects and non-pharmacologic comfort measures 01/16/2020 0724 by Frazier Butt, RN Outcome: Progressing 01/16/2020 0724 by Frazier Butt, RN Outcome: Progressing

## 2020-01-16 NOTE — Progress Notes (Addendum)
PROGRESS NOTE    Angela David   ZOX:[id number]  DOB: 03-Aug-1940  PCP: Iva Boop, MD    DOA: 01/15/2020 LOS: 0   Brief Narrative   Angela David is a 79 y.o. female with medical history significant for hypertension, diabetes, history of trachea stenosis status post tracheostomy who presented to the ED on 01/15/20 with mental status changes with disorientation, not recognizing family, generalized weakness for several days.    Evaluation in the ED was mostly unrevealing except for possible right lower lobe pneumonia.  Patient improving with IV antibiotics.  However, patient is without respiratory symptoms, trach secretions or leukocytosis.  Procalcitonin normal. She does, however, report history of what sounds like aspiration at times.  History of prior admission in January this year for AMS and found hypercapneic.  Pt says her confusion and weakness this time resembles how she felt back then.  VBG obtained in the ED was normal.  Ammonia level normal.  UA negative.  CT head negative.   11/20 - admit to inpatient given ongoing hallucinations today.       Assessment & Plan   Principal Problem:   Acute encephalopathy Active Problems:   Left lower lobe pneumonia   Gait abnormality   Anxiety state   HLD (hyperlipidemia)   Tracheostomy present (HCC)   Type 2 diabetes mellitus ([place])   Peripheral neuropathy    Acute encephalopathy / AMS - unclear etiology but suspect due to infection with RLL PNA.  VBG normal without hypercapnea.  Normal ammonia level. TSH normal.  CT head negative for anything acute.  UA negative.  Chest xray with LLL opacity and pleural effusion.  Vitamin D normal and B12 elevated.  CVA in the differential but neuro exam is nonfocal.  No hx of seizure. Chart review showed ED visit on 11/11 after a fall, head CT negative for acute findings then as well. --Neuro checks --Will get MRI brain w/o contrast to rule out stroke --Treat for PNA as  below --Will monitor for now and consider neuro or psych consult if hallucinations and confusion persist   Left lower lobe pneumonia / Left pleural effusion - POA by imaing.  Possibly causing her confusion and hallucinations.  However, no respiratory symptoms, no leukocytosis and normal procal.  Endorses some swallowing trouble at times. --Continue Rocephin and Zithromax --SLP Eval --Monitor O2 sat and provide supplemental O2 if needed to keep sats > 88%  Generalized weakness / Gait abnormality - possibly due to infection.  PT [name] evaluations.  Recent Fall / Right distal clavicular fracture - no complaint of pain from patient, discovered this on chart review when seen in ED 11/11 after a fall.  She was placed in sling and to follow up with ortho in 1 week, but ended up admitted.   Pt told ED physician she doesn't tolerate pain meds, okay just with Tylenol. --Monitor  Elevated D Dimer - POA, patient has no tachycardia or hypoxia, doubt PE.  Lower extremity dopplers were negative.  Suspect elevated due to PNA.  Anxiety / Depression - continue home Buspar, Lexapro  Hyperlipidemia - continue statin  Tracheostomy present - no acute issues.  Monitor.  Trach collar O2 as needed.  Type 2 diabetes mellitus - hold metformin.  Sliding scale Novolog.  Last A1c in Jan 2021 was 6.6%.  Peripheral neuropathy - hold gabapentin due to AMS, continue Requip for ?RLS  GERD - continue PPI   DVT prophylaxis: enoxaparin (LOVENOX) injection 40 mg Start: 01/15/20  [place] TED hose Start: 01/15/20 2237   Diet:  Diet Orders (From admission, onward)    Start     Ordered   01/15/20 2237  Diet Heart Room service appropriate? Yes; Fluid consistency: Thin  Diet effective now       Question Answer Comment  Room service appropriate? Yes   Fluid consistency: Thin      01/15/20 2239            Code Status: Full Code    Subjective 01/16/20    Pt seen at bedside this AM.  She reports feeling well  today, better.  Does not feel at all confused and answers all orientation questions correctly.  During our conversation, I did not notice confusion.  She denies fever/chills, cough, SOB.  Only symptoms she's had are that she's been generally weak and confused.  She worries this is like back in January when she had "that gas in my blood".  Chart review shows admission for hypercarbic respiratory failure at that time.  RN this afternoon reported ongoing hallucination this afternoon and confusion.   Disposition Plan & Communication   Status is: Inpatient  Remains inpatient appropriate because:IV treatments appropriate due to intensity of illness or inability to take PO   Dispo: The patient is from: Home              Anticipated d/c is to: Home              Anticipated d/c date is: 1-2 days              Patient currently is not medically stable to d/c.    Family Communication: unsuccessful attempt x 2 to reach husband by phone this afternoon.    Consults, Procedures, Significant Events   Consultants:   None  Procedures:   None  Antimicrobials:  Anti-infectives (From admission, onward)   Start     Dose/Rate Route Frequency Ordered Stop   01/18/20 0900  fluconazole (DIFLUCAN) tablet 150 mg        150 mg Oral Once per day on Mon Thu 01/15/20 2239     01/16/20 0345  azithromycin (ZITHROMAX) 500 mg in sodium chloride 0.9 % 250 mL IVPB        500 mg 250 mL/hr over 60 Minutes Intravenous Every 24 hours 01/16/20 0252 01/19/20 0344   01/16/20 0345  cefTRIAXone (ROCEPHIN) 1 g in sodium chloride 0.9 % 100 mL IVPB        1 g 200 mL/hr over 30 Minutes Intravenous Every 24 hours 01/16/20 0252           Objective   Vitals:   01/16/20 0457 01/16/20 0744 01/16/20 1225 01/16/20 1500  BP: (!) 150/65 (!) 156/64 127/87 126/73  Pulse: 88 90 80 78  Resp: 15 19 19    Temp: 97.7 F (36.5 C) 98.6 F (37 C) 98.6 F (37 C) 98.7 F (37.1 C)  TempSrc:      SpO2: 99% 95% 100% 99%  Weight:       Height:        Intake/Output Summary (Last 24 hours) at 01/16/2020 1701 Last data filed at 01/16/2020 0500 Gross per 24 hour  Intake 240 ml  Output --  Net 240 ml   Filed Weights   01/15/20 1341  Weight: 70.3 kg    Physical Exam:  General exam: awake, alert, no acute distress HEENT: trach in place without any secretions, moist mucus membranes, hearing grossly normal  Respiratory system:  CTAB with diminished left base, no wheezes or rhonchi, normal respiratory effort on room air. Cardiovascular system: normal S1/S2, RRR, no pedal edema.   Gastrointestinal system: soft, NT, ND, +bowel sounds. Central nervous system: A&O x4. no gross focal neurologic deficits, normal speech Extremities: moves all, no cyanosis, normal tone Psychiatry: normal mood, congruent affect, judgement and insight appear normal, no obvious hallucination during encounter  Labs   Data Reviewed: I have personally reviewed following labs and imaging studies  CBC: Recent Labs  Lab 01/15/20 1343 01/16/20 0512  WBC 8.7 7.4  HGB 12.6 11.3*  HCT 40.7 35.6*  MCV 84.6 81.7  PLT 206 761   Basic Metabolic Panel: Recent Labs  Lab 01/15/20 1343 01/16/20 0512  NA 138 136  K 4.4 3.9  CL 104 102  CO2 23 26  GLUCOSE 183* 138*  BUN 20 17  CREATININE 0.89 0.91  CALCIUM 9.4 9.0   GFR: Estimated Creatinine Clearance: 49.8 mL/min (by C-G formula based on SCr of 0.91 mg/dL). Liver Function Tests: Recent Labs  Lab 01/15/20 1343  AST 26  ALT 21  ALKPHOS 62  BILITOT 0.6  PROT 7.5  ALBUMIN 3.8   No results for input(s): LIPASE, AMYLASE in the last 168 hours. Recent Labs  Lab 01/16/20 0512  AMMONIA 21   Coagulation Profile: No results for input(s): INR, PROTIME in the last 168 hours. Cardiac Enzymes: No results for input(s): CKTOTAL, CKMB, CKMBINDEX, TROPONINI in the last 168 hours. BNP (last 3 results) No results for input(s): PROBNP in the last 8760 hours. HbA1C: No results for input(s):  HGBA1C in the last 72 hours. CBG: No results for input(s): GLUCAP in the last 168 hours. Lipid Profile: No results for input(s): CHOL, HDL, LDLCALC, TRIG, CHOLHDL, LDLDIRECT in the last 72 hours. Thyroid Function Tests: Recent Labs    01/16/20 0512  TSH 2.454   Anemia Panel: Recent Labs    01/16/20 0512  VITAMINB12 1,970*   Sepsis Labs: Recent Labs  Lab 01/15/20 1343  PROCALCITON <0.10    Recent Results (from the past 240 hour(s))  Resp Panel by RT-PCR (Flu A&B, Covid) Nasopharyngeal Swab     Status: None   Collection Time: 01/15/20  5:11 PM   Specimen: Nasopharyngeal Swab; Nasopharyngeal(NP) swabs in vial transport medium  Result Value Ref Range Status   SARS Coronavirus 2 by RT PCR NEGATIVE NEGATIVE Final    Comment: (NOTE) SARS-CoV-2 target nucleic acids are NOT DETECTED.  The SARS-CoV-2 RNA is generally detectable in upper respiratory specimens during the acute phase of infection. The lowest concentration of SARS-CoV-2 viral copies this assay can detect is 138 copies/mL. A negative result does not preclude SARS-Cov-2 infection and should not be used as the sole basis for treatment or other patient management decisions. A negative result may occur with  improper specimen collection/handling, submission of specimen other than nasopharyngeal swab, presence of viral mutation(s) within the areas targeted by this assay, and inadequate number of viral copies(<138 copies/mL). A negative result must be combined with clinical observations, patient history, and epidemiological information. The expected result is Negative.  Fact Sheet for Patients:  EntrepreneurPulse.com.au  Fact Sheet for Healthcare Providers:  IncredibleEmployment.be  This test is no t yet approved or cleared by the [place] FDA and  has been authorized for detection and/or diagnosis of SARS-CoV-2 by FDA under an Emergency Use Authorization (EUA). This EUA will  remain  in effect (meaning this test can be used) for the duration of the COVID-19 declaration under  Section 564(b)(1) of the Act, 21 U.S.C.section 360bbb-3(b)(1), unless the authorization is terminated  or revoked sooner.       Influenza A by PCR NEGATIVE NEGATIVE Final   Influenza B by PCR NEGATIVE NEGATIVE Final    Comment: (NOTE) The Xpert Xpress SARS-CoV-2/FLU/RSV plus assay is intended as an aid in the diagnosis of influenza from Nasopharyngeal swab specimens and should not be used as a sole basis for treatment. Nasal washings and aspirates are unacceptable for Xpert Xpress SARS-CoV-2/FLU/RSV testing.  Fact Sheet for Patients: EntrepreneurPulse.com.au  Fact Sheet for Healthcare Providers: IncredibleEmployment.be  This test is not yet approved or cleared by the [place] FDA and has been authorized for detection and/or diagnosis of SARS-CoV-2 by FDA under an Emergency Use Authorization (EUA). This EUA will remain in effect (meaning this test can be used) for the duration of the COVID-19 declaration under Section 564(b)(1) of the Act, 21 U.S.C. section 360bbb-3(b)(1), unless the authorization is terminated or revoked.  Performed at [facility], [address] [place], [place] [address]       Imaging Studies   DG Chest 2 View  Result Date: 01/15/2020 CLINICAL DATA:  Change in mental status EXAM: CHEST - 2 VIEW COMPARISON:  Jul 20, 2019 FINDINGS: The heart size and mediastinal contours are within normal limits. Tracheostomy tube is seen at the level of the clavicular heads. Again noted is elevation of the right hemidiaphragm. There is blunting of the left costophrenic angle which be due to combination of effusion and airspace opacity. IMPRESSION: Left pleural effusion with adjacent patchy airspace opacity which could be due to atelectasis and/or infectious etiology. Electronically Signed   By: Prudencio Pair M.D.   On:  01/15/2020 16:08   DG Tibia/Fibula Left  Result Date: 01/16/2020 CLINICAL DATA:  Initial evaluation for bilateral lower leg pain. EXAM: LEFT TIBIA AND FIBULA - 2 VIEW COMPARISON:  None. FINDINGS: Sequelae of prior ORIF at the tibial plateau/proximal tibia with lateral malleable plate screw fixation in place. No periprosthetic lucency to suggest loosening or failure. No acute fracture dislocation. No discrete osseous lesions. Osteoarthritic changes noted about the partially visualized knee. Posterior calcaneal enthesophyte noted. No visible soft tissue abnormality. IMPRESSION: 1. Sequelae of prior ORIF at the left tibial plateau/proximal tibia. No hardware complication. 2. No acute osseous abnormality about the left tibia/fibula. Electronically Signed   By: Jeannine Boga M.D.   On: 01/16/2020 04:00   DG Tibia/Fibula Right  Result Date: 01/16/2020 CLINICAL DATA:  Initial evaluation for bilateral lower leg pain. EXAM: RIGHT TIBIA AND FIBULA - 2 VIEW COMPARISON:  None. FINDINGS: No acute fracture or dislocation. Sclerotic focus at the proximal tibia noted, nonspecific, but most likely benign. Posterior calcaneal enthesophyte. No visible soft tissue abnormality. IMPRESSION: No acute osseous abnormality about the right tibia/fibula. No visible soft tissue abnormality. Electronically Signed   By: Jeannine Boga M.D.   On: 01/16/2020 04:03   CT Head Wo Contrast  Result Date: 01/15/2020 CLINICAL DATA:  Dizziness, nonspecific. Mental status change, unknown cause. Additional history provided: Hallucinations for 1 week. EXAM: CT HEAD WITHOUT CONTRAST TECHNIQUE: Contiguous axial images were obtained from the base of the skull through the vertex without intravenous contrast. COMPARISON:  Head CT 01/07/2020.  Brain MRI 03/21/2019. FINDINGS: Brain: Mild generalized cerebral atrophy. Advanced ill-defined hypoattenuation within the cerebral white matter is nonspecific, but compatible with chronic small  vessel ischemic disease. There is no acute intracranial hemorrhage. No demarcated cortical infarct. No extra-axial fluid collection. No evidence of  intracranial mass. No midline shift. Vascular: No hyperdense vessel.  Atherosclerotic calcifications. Skull: Normal. Negative for fracture or focal lesion. Sinuses/Orbits: Visualized orbits show no acute finding. Small volume frothy secretions within the right sphenoid sinus. Tiny right maxillary sinus mucous retention cyst. Trace fluid within the left mastoid air cells. IMPRESSION: No evidence of acute intracranial abnormality. Mild cerebral atrophy with advanced chronic small vessel ischemic disease, stable as compared to the head CT of 01/07/2020. Right sphenoid sinusitis. Electronically Signed   By: Kellie Simmering DO   On: 01/15/2020 16:40   US Venous Img Lower Bilateral (DVT)  Result Date: 01/16/2020 CLINICAL DATA:  Initial evaluation for lower extremity swelling. EXAM: BILATERAL LOWER EXTREMITY VENOUS DOPPLER ULTRASOUND TECHNIQUE: Gray-scale sonography with graded compression, as well as color Doppler and duplex ultrasound were performed to evaluate the lower extremity deep venous systems from the level of the common femoral vein and including the common femoral, femoral, profunda femoral, popliteal and calf veins including the posterior tibial, peroneal and gastrocnemius veins when visible. The superficial great saphenous vein was also interrogated. Spectral Doppler was utilized to evaluate flow at rest and with distal augmentation maneuvers in the common femoral, femoral and popliteal veins. COMPARISON:  None. FINDINGS: RIGHT LOWER EXTREMITY Common Femoral Vein: No evidence of thrombus. Normal compressibility, respiratory phasicity and response to augmentation. Saphenofemoral Junction: No evidence of thrombus. Normal compressibility and flow on color Doppler imaging. Profunda Femoral Vein: No evidence of thrombus. Normal compressibility and flow on color  Doppler imaging. Femoral Vein: No evidence of thrombus. Normal compressibility, respiratory phasicity and response to augmentation. Popliteal Vein: No evidence of thrombus. Normal compressibility, respiratory phasicity and response to augmentation. Calf Veins: No evidence of thrombus. Normal compressibility and flow on color Doppler imaging. Superficial Great Saphenous Vein: No evidence of thrombus. Normal compressibility. Venous Reflux:  None. Other Findings:  None. LEFT LOWER EXTREMITY Common Femoral Vein: No evidence of thrombus. Normal compressibility, respiratory phasicity and response to augmentation. Saphenofemoral Junction: No evidence of thrombus. Normal compressibility and flow on color Doppler imaging. Profunda Femoral Vein: No evidence of thrombus. Normal compressibility and flow on color Doppler imaging. Femoral Vein: No evidence of thrombus. Normal compressibility, respiratory phasicity and response to augmentation. Popliteal Vein: No evidence of thrombus. Normal compressibility, respiratory phasicity and response to augmentation. Calf Veins: No evidence of thrombus. Normal compressibility and flow on color Doppler imaging. Superficial Great Saphenous Vein: No evidence of thrombus. Normal compressibility. Venous Reflux:  None. Other Findings:  None. IMPRESSION: No evidence of deep venous thrombosis in either lower extremity. Electronically Signed   By: Jeannine Boga M.D.   On: 01/16/2020 01:32     Medications   Scheduled Meds: . allopurinol  100 mg Oral Daily  . busPIRone  30 mg Oral BID  . calcium-vitamin D  1 tablet Oral Daily  . enoxaparin (LOVENOX) injection  40 mg Subcutaneous Q24H  . escitalopram  10 mg Oral Daily  . escitalopram  20 mg Oral Daily  . [START ON 01/18/2020] fluconazole  150 mg Oral Once per day on Mon Thu  . folic acid  1 mg Oral Daily  . melatonin  5 mg Oral QHS  . metoprolol tartrate  25 mg Oral Daily  . pantoprazole  40 mg Oral Daily  . [START ON  01/17/2020] pneumococcal 23 valent vaccine  0.5 mL Intramuscular Tomorrow-1000  . pravastatin  10 mg Oral q1800  . rOPINIRole  0.5 mg Oral QHS   Continuous Infusions: . azithromycin 500 mg (01/16/20 0604)  .  cefTRIAXone (ROCEPHIN)  IV 1 g (01/16/20 0500)       LOS: 0 days    Time spent: 40 minutes with >50% spent in coordination of care and direct patient contact.    Ezekiel Slocumb, DO [facility]  01/16/2020, 5:01 PM    If 7PM-7AM, please contact night-coverage. How to contact the [facility] Attending or Consulting provider [place] or covering provider during after hours [place], for this patient?    1. Check the care team in [facility] and look for a) attending/consulting [facility] provider listed and b) the [facility] team listed 2. Log into [email or web address] and use [place] universal password to access. If you do not have the password, please contact the hospital operator. 3. Locate the [facility] provider you are looking for under [facility] and page to a number that you can be directly reached. 4. If you still have difficulty reaching the provider, please page the [facility] (Director on Call) for the Hospitalists listed on amion for assistance.

## 2020-01-17 ENCOUNTER — Inpatient Hospital Stay: Payer: Medicare Other

## 2020-01-17 ENCOUNTER — Inpatient Hospital Stay
Admit: 2020-01-17 | Discharge: 2020-01-17 | Disposition: A | Discharge status: Home / Self Care | Payer: Medicare Other | Attending: Internal Medicine | Admitting: Internal Medicine

## 2020-01-17 ENCOUNTER — Encounter: Payer: Self-pay | Admitting: Internal Medicine

## 2020-01-17 LAB — CBC WITH DIFFERENTIAL/PLATELET
Abs Immature Granulocytes: 0.05 10*3/uL (ref 0.00–0.07)
Basophils Absolute: 0 10*3/uL (ref 0.0–0.1)
Basophils Relative: 0 %
Eosinophils Absolute: 0 10*3/uL (ref 0.0–0.5)
Eosinophils Relative: 1 %
HCT: 39.7 % (ref 36.0–46.0)
Hemoglobin: 12.8 g/dL (ref 12.0–15.0)
Immature Granulocytes: 1 %
Lymphocytes Relative: 18 %
Lymphs Abs: 1.4 10*3/uL (ref 0.7–4.0)
MCH: 25.8 pg — ABNORMAL LOW (ref 26.0–34.0)
MCHC: 32.2 g/dL (ref 30.0–36.0)
MCV: 79.9 fL — ABNORMAL LOW (ref 80.0–100.0)
Monocytes Absolute: 0.5 10*3/uL (ref 0.1–1.0)
Monocytes Relative: 6 %
Neutro Abs: 5.9 10*3/uL (ref 1.7–7.7)
Neutrophils Relative %: 74 %
Platelets: 218 10*3/uL (ref 150–400)
RBC: 4.97 MIL/uL (ref 3.87–5.11)
RDW: 14.3 % (ref 11.5–15.5)
WBC: 7.8 10*3/uL (ref 4.0–10.5)
nRBC: 0 % (ref 0.0–0.2)

## 2020-01-17 LAB — ECHOCARDIOGRAM COMPLETE
Height: 64 in
S' Lateral: 1.78 cm
Weight: 2480 oz

## 2020-01-17 LAB — BASIC METABOLIC PANEL
Anion gap: 8 (ref 5–15)
BUN: 18 mg/dL (ref 8–23)
CO2: 24 mmol/L (ref 22–32)
Calcium: 10.2 mg/dL (ref 8.9–10.3)
Chloride: 105 mmol/L (ref 98–111)
Creatinine, Ser: 0.95 mg/dL (ref 0.44–1.00)
GFR, Estimated: >60 mL/min (ref >60)
Glucose, Bld: 182 mg/dL — ABNORMAL HIGH (ref 70–99)
Potassium: 3.7 mmol/L (ref 3.5–5.1)
Sodium: 137 mmol/L (ref 135–145)

## 2020-01-17 LAB — EXPECTORATED SPUTUM ASSESSMENT W GRAM STAIN, RFLX TO RESP C

## 2020-01-17 LAB — MAGNESIUM: Magnesium: 1.7 mg/dL (ref 1.7–2.4)

## 2020-01-17 LAB — GLUCOSE, CAPILLARY
Glucose-Capillary: 146 mg/dL — ABNORMAL HIGH (ref 70–99)
Glucose-Capillary: 160 mg/dL — ABNORMAL HIGH (ref 70–99)

## 2020-01-17 LAB — MRSA PCR SCREENING: MRSA by PCR: NEGATIVE

## 2020-01-17 IMAGING — US US ABDOMEN LIMITED
1 series · 14 of 25 positions shown · non-contrast
Comparison: 12/28/2010 abdominal CT

CLINICAL DATA: Right upper quadrant pain for more than 10 years,
worsening in the last month

EXAM:
ULTRASOUND ABDOMEN LIMITED RIGHT UPPER QUADRANT

[Series 1: us abdomen limited · 0.22mm/px · 14 of 46 slices shown]
[im 1/46]
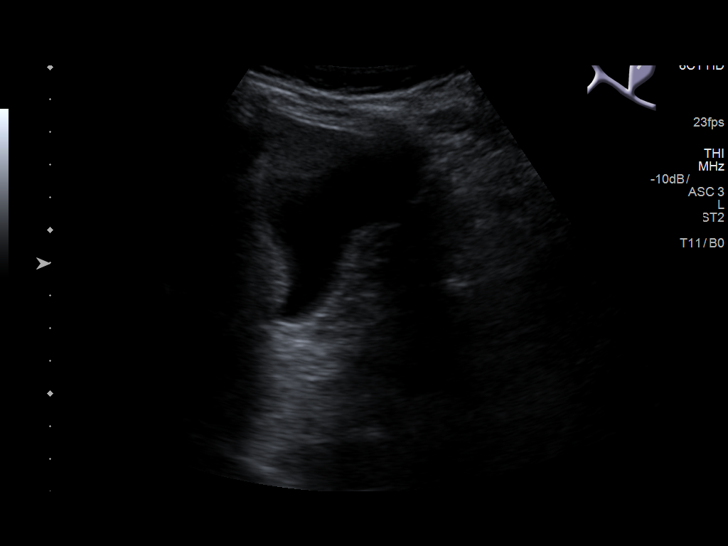
[im 4/46]
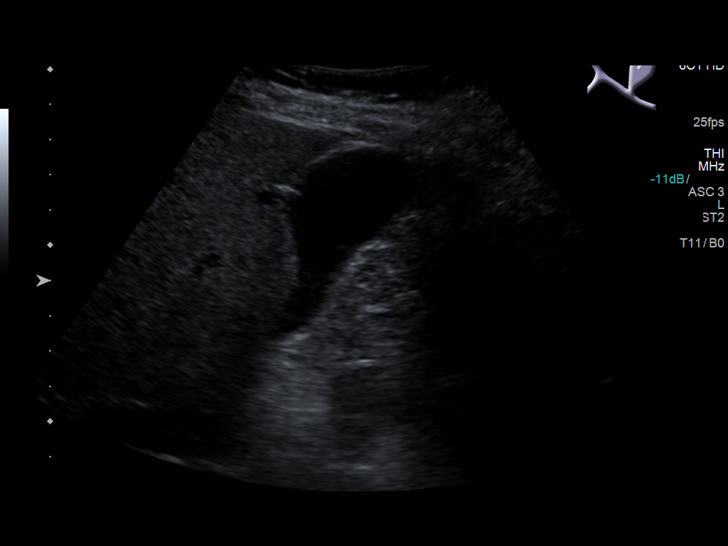
[im 8/46]
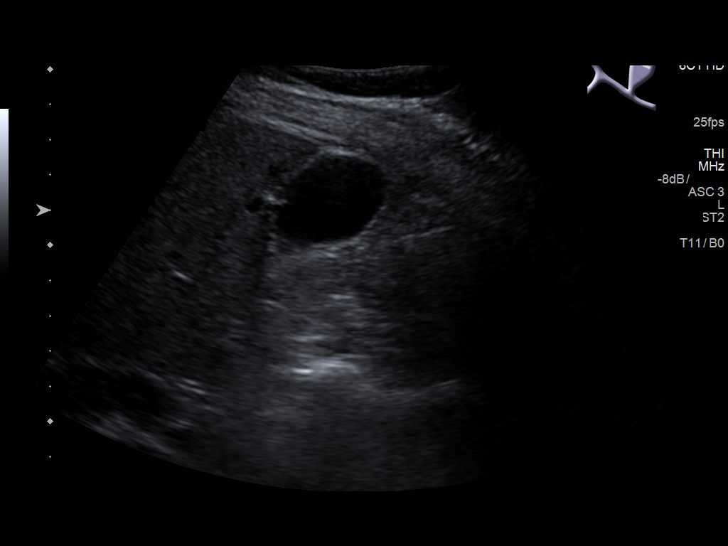
[im 12/46]
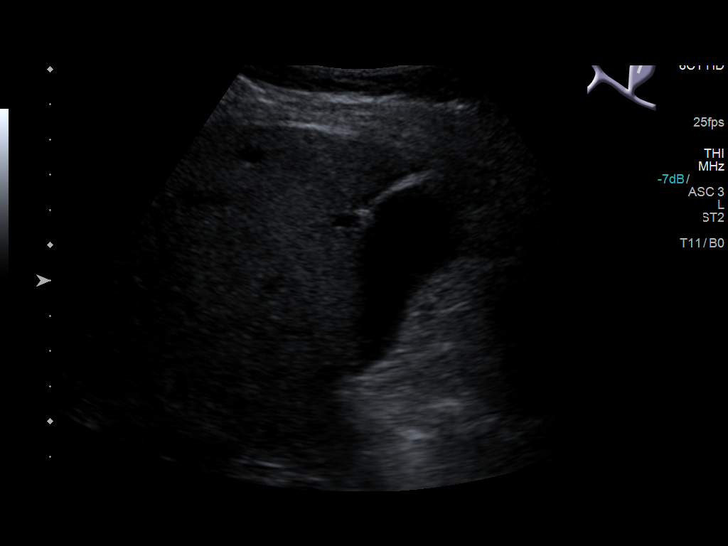
[im 16/46]
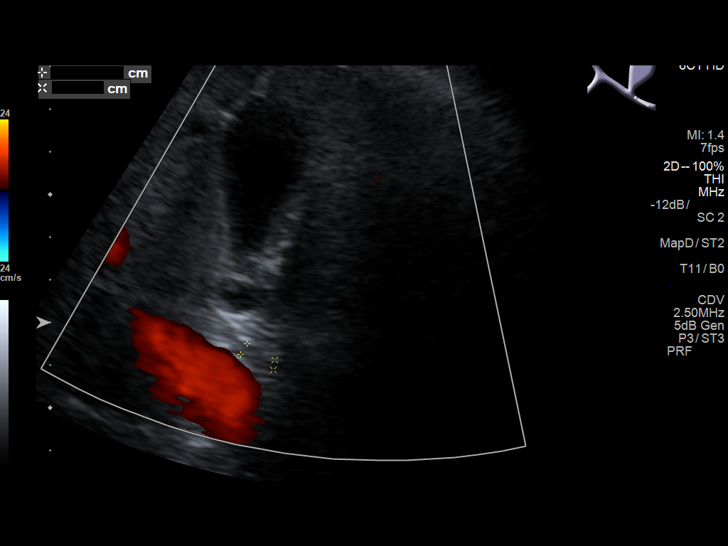
[im 17/46]
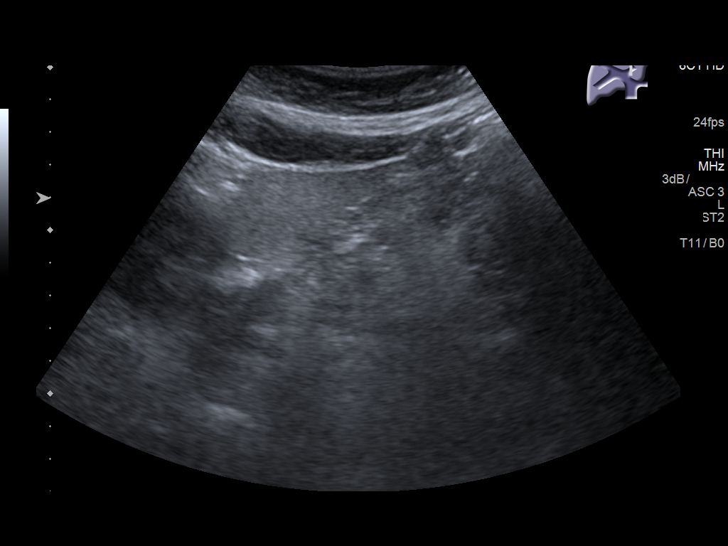
[im 21/46]
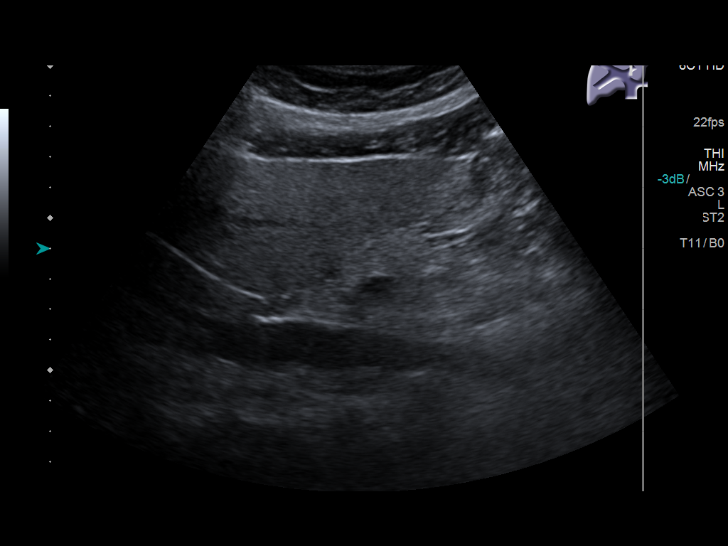
[im 25/46]
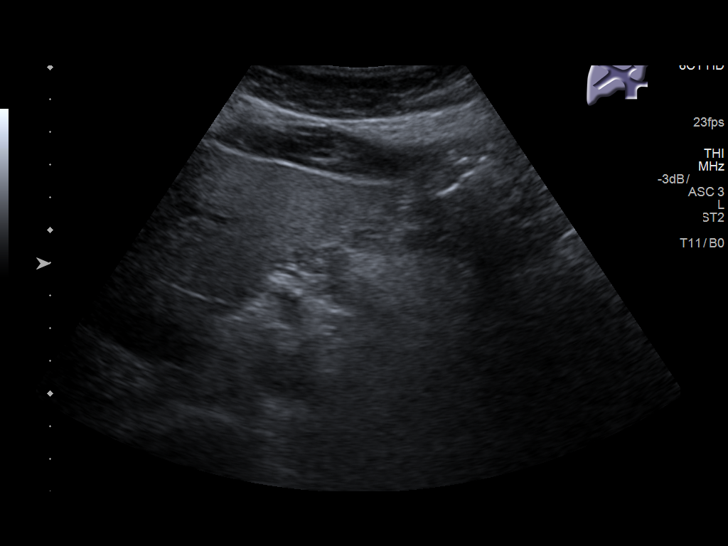
[im 29/46]
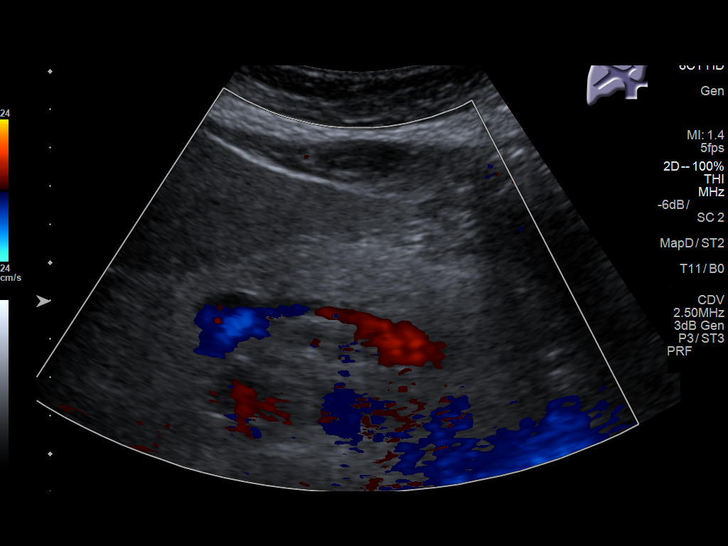
[im 31/46]
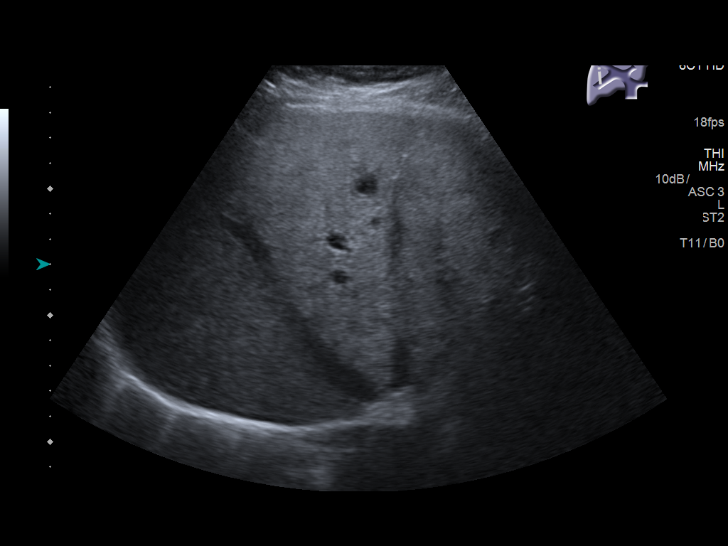
[im 34/46]
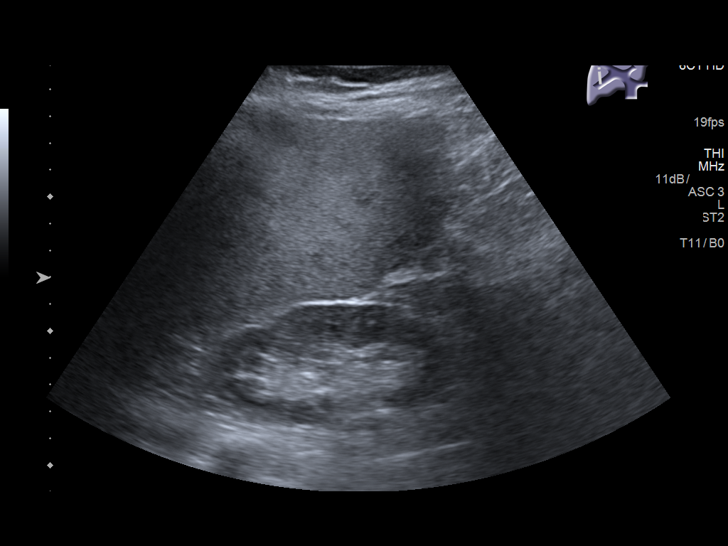
[im 38/46]
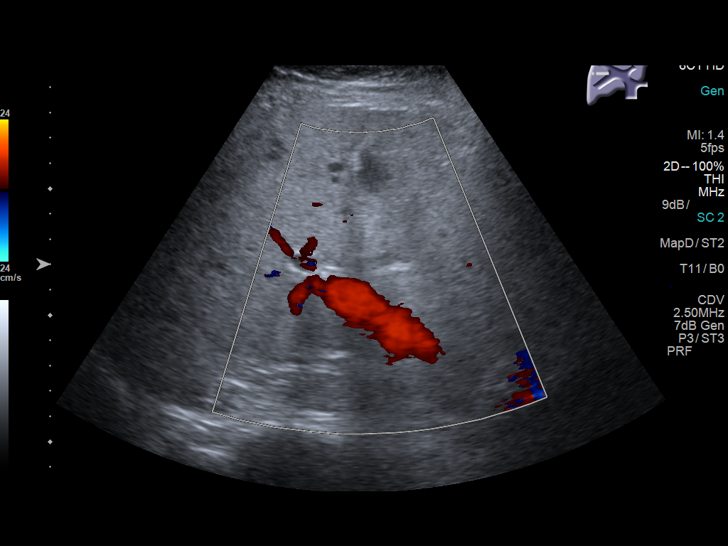
[im 42/46]
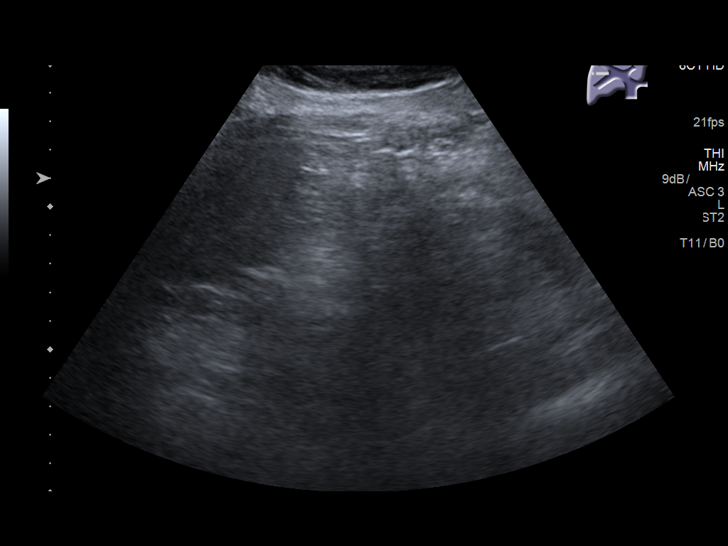
[im 46/46]
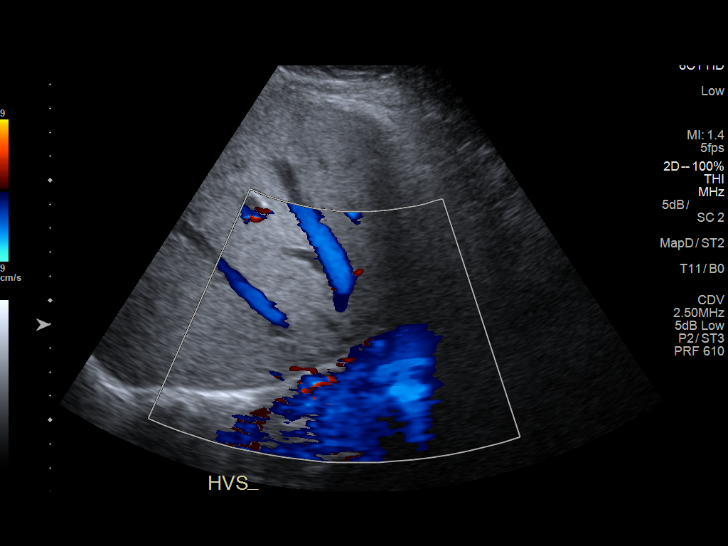

[14 of 25 positions shown; findings below may reference images not displayed]

FINDINGS: Gallbladder:

No gallstones or wall thickening visualized. No sonographic Murphy
sign noted by sonographer.

Common bile duct:

Diameter: 3 mm

Liver:

Echogenic liver with subtle sparing at the gallbladder fossa. No
evidence of mass lesion. Portal vein is patent on color Doppler
imaging with normal direction of blood flow towards the liver.
IMPRESSION: 1. Hepatic steatosis.
2. Normal gallbladder.

## 2020-01-17 MED ORDER — FENTANYL CITRATE (PF) 100 MCG/2ML IJ SOLN
12.5000 ug | INTRAMUSCULAR | Status: AC
  Administered 2020-01-17: 12.5 ug via INTRAVENOUS
  Filled 2020-01-17: qty 2

## 2020-01-17 MED ORDER — PANTOPRAZOLE SODIUM 40 MG IV SOLR
40.0000 mg | Freq: Every day | INTRAVENOUS | Status: DC
  Administered 2020-01-18 – 2020-01-20 (×3): 40 mg via INTRAVENOUS
  Filled 2020-01-17 (×3): qty 40

## 2020-01-17 MED ORDER — LABETALOL HCL 5 MG/ML IV SOLN
10.0000 mg | INTRAVENOUS | Status: DC | PRN
  Administered 2020-01-18 – 2020-01-21 (×5): 10 mg via INTRAVENOUS
  Filled 2020-01-17 (×6): qty 4

## 2020-01-17 MED ORDER — DICLOFENAC SODIUM 1 % EX GEL
2.0000 g | Freq: Four times a day (QID) | CUTANEOUS | Status: DC | PRN
  Administered 2020-01-18: 18:00:00 2 g via TOPICAL
  Filled 2020-01-17: qty 100

## 2020-01-17 MED ORDER — SODIUM CHLORIDE 0.9 % IV SOLN: INTRAVENOUS | Status: DC

## 2020-01-17 MED ORDER — CHLORHEXIDINE GLUCONATE CLOTH 2 % EX PADS
6.0000 | MEDICATED_PAD | Freq: Every day | CUTANEOUS | Status: DC
  Administered 2020-01-17 – 2020-01-21 (×5): 6 via TOPICAL

## 2020-01-17 MED ORDER — IOHEXOL 350 MG/ML SOLN
75.0000 mL | Freq: Once | INTRAVENOUS | Status: AC | PRN
  Administered 2020-01-17: 75 mL via INTRAVENOUS

## 2020-01-17 NOTE — Progress Notes (Addendum)
PROGRESS NOTE    Jana Swartzlander   FAO:[id number]  DOB: 10/04/1940  PCP: Iva Boop, MD    DOA: 01/15/2020 LOS: 1   Brief Narrative   Brisa Auth is a 79 y.o. female with medical history significant for hypertension, diabetes, history of trachea stenosis status post tracheostomy who presented to the ED on 01/15/20 with mental status changes with disorientation, not recognizing family, generalized weakness for several days.    Evaluation in the ED was mostly unrevealing except for possible right lower lobe pneumonia.  Patient improving with IV antibiotics.  However, patient is without respiratory symptoms, trach secretions or leukocytosis.  Procalcitonin normal. She does, however, report history of what sounds like aspiration at times.  History of prior admission in January this year for AMS and found hypercapneic.  Pt says her confusion and weakness this time resembles how she felt back then.  VBG obtained in the ED was normal.  Ammonia level normal.  UA negative.  CT head negative.   11/20 - admit to inpatient given ongoing hallucinations today.       Assessment & Plan   Principal Problem:   Acute encephalopathy Active Problems:   Left lower lobe pneumonia   Gait abnormality   Anxiety state   HLD (hyperlipidemia)   Tracheostomy present (HCC)   Type 2 diabetes mellitus (HCC)   Peripheral neuropathy   Addendum - patient has increasing secretions and unable to push call button, too weak to cough up secretions, requires close monitoring.  Transferring to stepdown unit.   Acute encephalopathy / AMS - unclear etiology but suspect due to infection with RLL PNA.  VBG normal without hypercapnea.  Normal ammonia level. TSH normal.  CT head negative for anything acute.  UA negative.  Chest xray with LLL opacity and pleural effusion.  Vitamin D normal and B12 elevated.  CVA in the differential but neuro exam is nonfocal.  No hx of seizure. Chart review showed  ED visit on 11/11 after a fall, head CT negative for acute findings then as well. MRI brain 11/20 showed only advanced chronic microvascular disease and atrophy, no stroke, mass or other findings to explain her confusion and hallucinations. Suspect patient has vascular or some form of dementia, now with superimposed delirium with infection. --Neuro checks --Treat for PNA as below --Will monitor for now and consider neuro or psych consult if hallucinations and confusion persist   Left lower lobe pneumonia / Left pleural effusion - POA by imaing.  Possibly causing her confusion and hallucinations.  However, no respiratory symptoms, no leukocytosis and normal procal.  Endorses some swallowing trouble at times. 11/21 - now with purulent appearing trach secretions --Continue Rocephin and Zithromax --SLP Eval --Monitor O2 sat and provide supplemental O2 if needed to keep sats > 88% --get sputum culture, blood culture --? Repeat CXR tomorrow AM  ?Aspiration / Dysphagia - NPO.  Hold PO meds.  Maintenance IV fluids.  SLP eval pending.  Generalized weakness / Gait abnormality - possibly due to infection.  PT [name] evaluations.  Recent Fall / Right distal clavicular fracture - no complaint of pain from patient, discovered this on chart review when seen in ED 11/11 after a fall.  She was placed in sling and to follow up with ortho in 1 week, but ended up admitted.   Pt told ED physician she doesn't tolerate pain meds, okay just with Tylenol. --Monitor  Elevated D Dimer - POA, patient has no tachycardia or hypoxia, doubt  PE.  Lower extremity dopplers were negative.  Suspect elevated due to PNA.  Anxiety / Depression - continue home Buspar, Lexapro  Hyperlipidemia - continue statin  Tracheostomy present - no acute issues.  Monitor.  Trach collar O2 as needed.  Type 2 diabetes mellitus - hold metformin.  Sliding scale Novolog.  Last A1c in Jan 2021 was 6.6%.  Peripheral neuropathy - hold  gabapentin due to AMS, continue Requip for ?RLS  GERD - continue PPI   DVT prophylaxis: enoxaparin (LOVENOX) injection 40 mg Start: 01/15/20 2245 Place TED hose Start: 01/15/20 2237   Diet:  Diet Orders (From admission, onward)    Start     Ordered   01/17/20 1209  Diet NPO time specified  Diet effective now        01/17/20 1211            Code Status: Full Code    Subjective 01/17/20    Pt seen at bedside today.  She is sleeping, responds to voice and tactile stimulation.  Opens eyes to command and smiles but closes eyes again quickly.  Shakes head no when asked if feeling feverish, short of breath.    Per report from therapy, attempted small bites of eggs and noted pocketing food, prolonged chew time and then spits out food.  Now has trach secretions which are new today.  Made NPO for now until formal swallow evaluation.    Disposition Plan & Communication   Status is: Inpatient  Remains inpatient appropriate because:IV treatments appropriate due to intensity of illness or inability to take PO.  Unsafe swallowing, NPO pending speech eval.  Remains on IV antibiotics.  May require SNF placement.   Dispo: The patient is from: Home              Anticipated d/c is to: Home              Anticipated d/c date is: 3 days              Patient currently is not medically stable to d/c.    Family Communication: none at bedside on rounds.  Niece visiting this afternoon, discussed case and plan of care an length and she is agreeable and appreciative of transfer to stepdown for close monitoring.  Consults, Procedures, Significant Events   Consultants:   None  Procedures:   None  Antimicrobials:  Anti-infectives (From admission, onward)   Start     Dose/Rate Route Frequency Ordered Stop   01/18/20 0900  fluconazole (DIFLUCAN) tablet 150 mg        150 mg Oral Once per day on Mon Thu 01/15/20 2239     01/16/20 0345  azithromycin (ZITHROMAX) 500 mg in sodium chloride 0.9 % 250  mL IVPB        500 mg 250 mL/hr over 60 Minutes Intravenous Every 24 hours 01/16/20 0252 01/19/20 0344   01/16/20 0345  cefTRIAXone (ROCEPHIN) 1 g in sodium chloride 0.9 % 100 mL IVPB        1 g 200 mL/hr over 30 Minutes Intravenous Every 24 hours 01/16/20 0252           Objective   Vitals:   01/16/20 2346 01/17/20 0017 01/17/20 0420 01/17/20 0758  BP:  (!) 146/106 (!) 158/82 (!) 168/81  Pulse: 100 98 (!) 102 (!) 105  Resp: 20  20 19   Temp: 98.8 F (37.1 C)  98.5 F (36.9 C) 98.6 F (37 C)  TempSrc: Oral  Axillary  SpO2: 93%  100% 97%  Weight:      Height:        Intake/Output Summary (Last 24 hours) at 01/17/2020 1212 Last data filed at 01/17/2020 0500 Gross per 24 hour  Intake --  Output 800 ml  Net -800 ml   Filed Weights   01/15/20 1341  Weight: 70.3 kg    Physical Exam:  General exam: sleeping but responsive only briefly, no acute distress HEENT: trach in place visible purulent appearing secretions and audible secretion sounds (none whatsoever yesterday), moist mucus membranes, hearing grossly normal Respiratory system: upper airway secretion sounds, no wheezes or rhonchi, trach collar in place with supplemental oxygen Cardiovascular system: normal S1/S2, RRR, no pedal edema.   Central nervous system: unable to evaluate due to pt condition at this time Extremities: no cyanosis, no edema Psychiatry: unable to evaluate due to pt condition at this time  Labs   Data Reviewed: I have personally reviewed following labs and imaging studies  CBC: Recent Labs  Lab 01/15/20 1343 01/16/20 0512 01/17/20 0436  WBC 8.7 7.4 7.8  NEUTROABS  --   --  5.9  HGB 12.6 11.3* 12.8  HCT 40.7 35.6* 39.7  MCV 84.6 81.7 79.9*  PLT 206 201 902   Basic Metabolic Panel: Recent Labs  Lab 01/15/20 1343 01/16/20 0512 01/17/20 0436  NA 138 136 137  K 4.4 3.9 3.7  CL 104 102 105  CO2 23 26 24   GLUCOSE 183* 138* 182*  BUN 20 17 18   CREATININE 0.89 0.91 0.95  CALCIUM  9.4 9.0 10.2  MG  --   --  1.7   GFR: Estimated Creatinine Clearance: 47.7 mL/min (by C-G formula based on SCr of 0.95 mg/dL). Liver Function Tests: Recent Labs  Lab 01/15/20 1343  AST 26  ALT 21  ALKPHOS 62  BILITOT 0.6  PROT 7.5  ALBUMIN 3.8   No results for input(s): LIPASE, AMYLASE in the last 168 hours. Recent Labs  Lab 01/16/20 0512  AMMONIA 21   Coagulation Profile: No results for input(s): INR, PROTIME in the last 168 hours. Cardiac Enzymes: No results for input(s): CKTOTAL, CKMB, CKMBINDEX, TROPONINI in the last 168 hours. BNP (last 3 results) No results for input(s): PROBNP in the last 8760 hours. HbA1C: No results for input(s): HGBA1C in the last 72 hours. CBG: No results for input(s): GLUCAP in the last 168 hours. Lipid Profile: No results for input(s): CHOL, HDL, LDLCALC, TRIG, CHOLHDL, LDLDIRECT in the last 72 hours. Thyroid Function Tests: Recent Labs    01/16/20 0512  TSH 2.454   Anemia Panel: Recent Labs    01/16/20 0512  VITAMINB12 1,970*   Sepsis Labs: Recent Labs  Lab 01/15/20 1343  PROCALCITON <0.10    Recent Results (from the past 240 hour(s))  Resp Panel by RT-PCR (Flu A&B, Covid) Nasopharyngeal Swab     Status: None   Collection Time: 01/15/20  5:11 PM   Specimen: Nasopharyngeal Swab; Nasopharyngeal(NP) swabs in vial transport medium  Result Value Ref Range Status   SARS Coronavirus 2 by RT PCR NEGATIVE NEGATIVE Final    Comment: (NOTE) SARS-CoV-2 target nucleic acids are NOT DETECTED.  The SARS-CoV-2 RNA is generally detectable in upper respiratory specimens during the acute phase of infection. The lowest concentration of SARS-CoV-2 viral copies this assay can detect is 138 copies/mL. A negative result does not preclude SARS-Cov-2 infection and should not be used as the sole basis for treatment or other patient management decisions. A negative  result may occur with  improper specimen collection/handling, submission of  specimen other than nasopharyngeal swab, presence of viral mutation(s) within the areas targeted by this assay, and inadequate number of viral copies(<138 copies/mL). A negative result must be combined with clinical observations, patient history, and epidemiological information. The expected result is Negative.  Fact Sheet for Patients:  EntrepreneurPulse.com.au  Fact Sheet for Healthcare Providers:  IncredibleEmployment.be  This test is no t yet approved or cleared by the [place] FDA and  has been authorized for detection and/or diagnosis of SARS-CoV-2 by FDA under an Emergency Use Authorization (EUA). This EUA will remain  in effect (meaning this test can be used) for the duration of the COVID-19 declaration under Section 564(b)(1) of the Act, 21 U.S.C.section 360bbb-3(b)(1), unless the authorization is terminated  or revoked sooner.       Influenza A by PCR NEGATIVE NEGATIVE Final   Influenza B by PCR NEGATIVE NEGATIVE Final    Comment: (NOTE) The Xpert Xpress SARS-CoV-2/FLU/RSV plus assay is intended as an aid in the diagnosis of influenza from Nasopharyngeal swab specimens and should not be used as a sole basis for treatment. Nasal washings and aspirates are unacceptable for Xpert Xpress SARS-CoV-2/FLU/RSV testing.  Fact Sheet for Patients: EntrepreneurPulse.com.au  Fact Sheet for Healthcare Providers: IncredibleEmployment.be  This test is not yet approved or cleared by the [place] FDA and has been authorized for detection and/or diagnosis of SARS-CoV-2 by FDA under an Emergency Use Authorization (EUA). This EUA will remain in effect (meaning this test can be used) for the duration of the COVID-19 declaration under Section 564(b)(1) of the Act, 21 U.S.C. section 360bbb-3(b)(1), unless the authorization is terminated or revoked.  Performed at [facility], [address] [place], [place] [address]       Imaging Studies   DG Chest 2 View  Result Date: 01/15/2020 CLINICAL DATA:  Change in mental status EXAM: CHEST - 2 VIEW COMPARISON:  Jul 20, 2019 FINDINGS: The heart size and mediastinal contours are within normal limits. Tracheostomy tube is seen at the level of the clavicular heads. Again noted is elevation of the right hemidiaphragm. There is blunting of the left costophrenic angle which be due to combination of effusion and airspace opacity. IMPRESSION: Left pleural effusion with adjacent patchy airspace opacity which could be due to atelectasis and/or infectious etiology. Electronically Signed   By: Prudencio Pair M.D.   On: 01/15/2020 16:08   DG Tibia/Fibula Left  Result Date: 01/16/2020 CLINICAL DATA:  Initial evaluation for bilateral lower leg pain. EXAM: LEFT TIBIA AND FIBULA - 2 VIEW COMPARISON:  None. FINDINGS: Sequelae of prior ORIF at the tibial plateau/proximal tibia with lateral malleable plate screw fixation in place. No periprosthetic lucency to suggest loosening or failure. No acute fracture dislocation. No discrete osseous lesions. Osteoarthritic changes noted about the partially visualized knee. Posterior calcaneal enthesophyte noted. No visible soft tissue abnormality. IMPRESSION: 1. Sequelae of prior ORIF at the left tibial plateau/proximal tibia. No hardware complication. 2. No acute osseous abnormality about the left tibia/fibula. Electronically Signed   By: Jeannine Boga M.D.   On: 01/16/2020 04:00   DG Tibia/Fibula Right  Result Date: 01/16/2020 CLINICAL DATA:  Initial evaluation for bilateral lower leg pain. EXAM: RIGHT TIBIA AND FIBULA - 2 VIEW COMPARISON:  None. FINDINGS: No acute fracture or dislocation. Sclerotic focus at the proximal tibia noted, nonspecific, but most likely benign. Posterior calcaneal enthesophyte. No visible soft tissue abnormality. IMPRESSION: No acute osseous abnormality about  the right tibia/fibula. No  visible soft tissue abnormality. Electronically Signed   By: Jeannine Boga M.D.   On: 01/16/2020 04:03   CT Head Wo Contrast  Result Date: 01/15/2020 CLINICAL DATA:  Dizziness, nonspecific. Mental status change, unknown cause. Additional history provided: Hallucinations for 1 week. EXAM: CT HEAD WITHOUT CONTRAST TECHNIQUE: Contiguous axial images were obtained from the base of the skull through the vertex without intravenous contrast. COMPARISON:  Head CT 01/07/2020.  Brain MRI 03/21/2019. FINDINGS: Brain: Mild generalized cerebral atrophy. Advanced ill-defined hypoattenuation within the cerebral white matter is nonspecific, but compatible with chronic small vessel ischemic disease. There is no acute intracranial hemorrhage. No demarcated cortical infarct. No extra-axial fluid collection. No evidence of intracranial mass. No midline shift. Vascular: No hyperdense vessel.  Atherosclerotic calcifications. Skull: Normal. Negative for fracture or focal lesion. Sinuses/Orbits: Visualized orbits show no acute finding. Small volume frothy secretions within the right sphenoid sinus. Tiny right maxillary sinus mucous retention cyst. Trace fluid within the left mastoid air cells. IMPRESSION: No evidence of acute intracranial abnormality. Mild cerebral atrophy with advanced chronic small vessel ischemic disease, stable as compared to the head CT of 01/07/2020. Right sphenoid sinusitis. Electronically Signed   By: Kellie Simmering DO   On: 01/15/2020 16:40   MR BRAIN WO CONTRAST  Result Date: 01/16/2020 CLINICAL DATA:  Mental status change EXAM: MRI HEAD WITHOUT CONTRAST TECHNIQUE: Multiplanar, multiecho pulse sequences of the brain and surrounding structures were obtained without intravenous contrast. COMPARISON:  03/21/2019 FINDINGS: Motion artifact is present. Brain: There is no acute infarction or intracranial hemorrhage. There is no intracranial mass, mass effect, or edema. There is no hydrocephalus or  extra-axial fluid collection. Prominence of the ventricles and sulci reflects generalized parenchymal volume loss. Patchy and confluent areas of T2 hyperintensity in the supratentorial greater than pontine white matter are nonspecific but probably reflect advanced chronic microvascular ischemic changes. Vascular: Major vessel flow voids at the skull base are preserved. Skull and upper cervical spine: Normal marrow signal is preserved. Sinuses/Orbits: Paranasal sinuses are aerated. Orbits are unremarkable. Other: Sella is unremarkable.  Mastoid air cells are clear. IMPRESSION: No acute infarction, hemorrhage, or mass. Advanced chronic microvascular ischemic changes. Electronically Signed   By: Macy Mis M.D.   On: 01/16/2020 20:02   US Venous Img Lower Bilateral (DVT)  Result Date: 01/16/2020 CLINICAL DATA:  Initial evaluation for lower extremity swelling. EXAM: BILATERAL LOWER EXTREMITY VENOUS DOPPLER ULTRASOUND TECHNIQUE: Gray-scale sonography with graded compression, as well as color Doppler and duplex ultrasound were performed to evaluate the lower extremity deep venous systems from the level of the common femoral vein and including the common femoral, femoral, profunda femoral, popliteal and calf veins including the posterior tibial, peroneal and gastrocnemius veins when visible. The superficial great saphenous vein was also interrogated. Spectral Doppler was utilized to evaluate flow at rest and with distal augmentation maneuvers in the common femoral, femoral and popliteal veins. COMPARISON:  None. FINDINGS: RIGHT LOWER EXTREMITY Common Femoral Vein: No evidence of thrombus. Normal compressibility, respiratory phasicity and response to augmentation. Saphenofemoral Junction: No evidence of thrombus. Normal compressibility and flow on color Doppler imaging. Profunda Femoral Vein: No evidence of thrombus. Normal compressibility and flow on color Doppler imaging. Femoral Vein: No evidence of thrombus.  Normal compressibility, respiratory phasicity and response to augmentation. Popliteal Vein: No evidence of thrombus. Normal compressibility, respiratory phasicity and response to augmentation. Calf Veins: No evidence of thrombus. Normal compressibility and flow on color Doppler imaging. Superficial Great Saphenous Vein:  No evidence of thrombus. Normal compressibility. Venous Reflux:  None. Other Findings:  None. LEFT LOWER EXTREMITY Common Femoral Vein: No evidence of thrombus. Normal compressibility, respiratory phasicity and response to augmentation. Saphenofemoral Junction: No evidence of thrombus. Normal compressibility and flow on color Doppler imaging. Profunda Femoral Vein: No evidence of thrombus. Normal compressibility and flow on color Doppler imaging. Femoral Vein: No evidence of thrombus. Normal compressibility, respiratory phasicity and response to augmentation. Popliteal Vein: No evidence of thrombus. Normal compressibility, respiratory phasicity and response to augmentation. Calf Veins: No evidence of thrombus. Normal compressibility and flow on color Doppler imaging. Superficial Great Saphenous Vein: No evidence of thrombus. Normal compressibility. Venous Reflux:  None. Other Findings:  None. IMPRESSION: No evidence of deep venous thrombosis in either lower extremity. Electronically Signed   By: Jeannine Boga M.D.   On: 01/16/2020 01:32   ECHOCARDIOGRAM COMPLETE  Result Date: 01/17/2020    ECHOCARDIOGRAM REPORT   Patient Name:   Kween SMALL Chopin Date of Exam: 01/17/2020 Medical Rec #:  [id number]           Height:       64.0 in Accession #:    [id number]          Weight:       155.0 lb Date of Birth:  [birth year]          BSA:          1.756 m Patient Age:    18 years            BP:           168/81 mmHg Patient Gender: F                   HR:           105 bpm. Exam Location:  [facility] Procedure: 2D Echo, Cardiac Doppler and Color Doppler Indications:     Atrial fibrillation 427.31   History:         Patient has prior history of Echocardiogram examinations, most                  recent 03/24/2019. Risk Factors:Diabetes. High cholesterol.  Sonographer:     Sherrie Sport RDCS (AE) Referring Phys:  [id number] Claiborne Billings A Tanyla Stege Diagnosing Phys: Serafina Royals MD  Sonographer Comments: Technically difficult study due to poor echo windows, no subcostal window and no apical window. IMPRESSIONS  1. Left ventricular ejection fraction, by estimation, is 60 to 65%. The left ventricle has normal function. The left ventricle has no regional wall motion abnormalities. Left ventricular diastolic function could not be evaluated.  2. Right ventricular systolic function is normal. The right ventricular size is normal.  3. The mitral valve is grossly normal. No evidence of mitral valve regurgitation.  4. The aortic valve is grossly normal. Aortic valve regurgitation is not visualized. FINDINGS  Left Ventricle: Left ventricular ejection fraction, by estimation, is 60 to 65%. The left ventricle has normal function. The left ventricle has no regional wall motion abnormalities. The left ventricular internal cavity size was normal in size. There is  no left ventricular hypertrophy. Left ventricular diastolic function could not be evaluated. Right Ventricle: The right ventricular size is normal. No increase in right ventricular wall thickness. Right ventricular systolic function is normal. Left Atrium: Left atrial size was normal in size. Right Atrium: Right atrial size was normal in size. Pericardium: There is no evidence of pericardial effusion. Mitral Valve: The mitral valve is grossly normal.  No evidence of mitral valve regurgitation. Tricuspid Valve: The tricuspid valve is grossly normal. Tricuspid valve regurgitation is not demonstrated. Aortic Valve: The aortic valve is grossly normal. Aortic valve regurgitation is not visualized. Pulmonic Valve: The pulmonic valve was not assessed. Pulmonic valve regurgitation is not  visualized. Aorta: The aortic root and ascending aorta are structurally normal, with no evidence of dilitation. IAS/Shunts: The interatrial septum was not assessed.  LEFT VENTRICLE PLAX 2D LVIDd:         3.22 cm LVIDs:         1.78 cm LV PW:         1.10 cm LV IVS:        1.19 cm LVOT diam:     2.00 cm LVOT Area:     3.14 cm  LEFT ATRIUM         Index LA diam:    1.80 cm 1.03 cm/m   AORTA Ao Root diam: 2.30 cm  SHUNTS Systemic Diam: 2.00 cm Serafina Royals MD Electronically signed by Serafina Royals MD Signature Date/Time: 01/17/2020/8:39:33 AM    Final      Medications   Scheduled Meds: . enoxaparin (LOVENOX) injection  40 mg Subcutaneous Q24H  . [START ON 01/18/2020] fluconazole  150 mg Oral Once per day on Mon Thu  . [START ON 01/18/2020] pantoprazole (PROTONIX) IV  40 mg Intravenous Daily  . pneumococcal 23 valent vaccine  0.5 mL Intramuscular Tomorrow-1000   Continuous Infusions: . azithromycin 500 mg (01/16/20 0604)  . cefTRIAXone (ROCEPHIN)  IV 1 g (01/17/20 0438)       LOS: 1 day    Time spent: 30 minutes with >50% spent in coordination of care and direct patient contact.    Ezekiel Slocumb, DO [facility]  01/17/2020, 12:12 PM    If 7PM-7AM, please contact night-coverage. How to contact the [facility] Attending or Consulting provider [place] or covering provider during after hours [place], for this patient?    1. Check the care team in [facility] and look for a) attending/consulting [facility] provider listed and b) the [facility] team listed 2. Log into [email or web address] and use [place]'s universal password to access. If you do not have the password, please contact the hospital operator. 3. Locate the [facility] provider you are looking for under [facility] and page to a number that you can be directly reached. 4. If you still have difficulty reaching the provider, please page the [facility] (Director on Call) for the Hospitalists listed on amion for assistance.

## 2020-01-17 NOTE — Progress Notes (Signed)
Her condition is not reflective of what is in the notes or of what was reported.  She is alert, but not speaking in full sentences, only "yes" and "no" answers.  Patient is sleeping and breifly responsive to stimuli.  There is a copious amount of secretions draining from her trach, RT contacted to suction patient.    Patient does not appear to be able to safelt swallow. Family called and it was confirmed that she has difficulty swallowing and that she goes through phases where she is totally unable to swallow. Do made aware and patient made NPO.

## 2020-01-17 NOTE — Progress Notes (Signed)
Husband at bedside and communicated concerns regarding pt's neuro status decline since yesterday evening. Pt opens eyes to command and turns head to voice but does not follow commands with any extremity. Pt appears restless in the bed. VSS. Message sent to Randol Kern, NP regarding pt neuro status, and for updated orders.   NP came to bedside to discuss plan of care with RN and pt husband.

## 2020-01-17 NOTE — Progress Notes (Signed)
Ok to dc tele monitoring per Dr Griffith  

## 2020-01-17 NOTE — Progress Notes (Signed)
*  PRELIMINARY RESULTS* Echocardiogram 2D Echocardiogram has been performed.  Angela David 01/17/2020, 8:14 AM

## 2020-01-17 NOTE — Evaluation (Signed)
Occupational Therapy Evaluation Patient Details Name: Angela David MRN: [id number] DOB: [birth year] Today's Date: 01/17/2020    History of Present Illness Pt is a 79 y/o F admitted on 01/15/20 with c/o hallucinations x 1 week. Imaging negative. Pt admitted for tx of acute encephalopathy of unclear etiology but suspect due to RLL PNA. (Pt also recently seen in ED on 01/11/20 s/p fall & found to have R clavicular fx & sent home with R shoulder immobilizer, instructed to f/u with ortho on OP basis.) PMH: R breast CA (2002), DM, peripheral neuropathy, glaucoma, high cholesterol, vocal cord paralysis   Clinical Impression   Ms Droz was seen for OT evaluation this date. Prior to hospital admission, pt was MOD I for mobility using cane, reports assist from husband for ADLs and uses depends. Pt lives c husband in 1 level home c ramped entrance per chart review - pt minimally verbally responsive. Pt presents to acute OT demonstrating impaired ADL performance and functional mobility 2/2 decreased safety awareness, functional strength/balance deficits, and decreased activity tolerance. Pt currently requires close SBA face washing seated EOB. MOD A self-drinking at bed level - unable to determine if pt adequately suctioning through straw. Trialed breakfast at bedside - pt chewed small bite of eggs however c increased time unable to swallow, pockets food, and ultimately requested to spit out (pt affirming she felt she could not swallow it at this time). RN notified concerns for aspiration and reccommend SLP consult. MAX A x2 for perihygiene in standing - required x5 standing trials to thoroughly clean as pt tolerance decreased from ~1 min to ~20". Pt would benefit from skilled OT to address noted impairments and functional limitations (see below for any additional details) in order to maximize safety and independence while minimizing falls risk and caregiver burden. Upon hospital discharge, recommend STR to  maximize pt safety and return to PLOF.     Follow Up Recommendations  SNF    Equipment Recommendations  Other (comment) (TBD)    Recommendations for Other Services Speech consult     Precautions / Restrictions Precautions Precautions: Fall Restrictions Other Position/Activity Restrictions: Pt with findings of R clavicle fx on 01/11/20, sent home with shoulder immobilizer & told to f/u with ortho as outpatient but unable to do this since readmission to hospital. Therapy electing to maintain NWB, no ROM R shoulder      Mobility Bed Mobility Overal bed mobility: Needs Assistance Bed Mobility: Supine to Sit;Sit to Supine;Rolling Rolling: Max assist   Supine to sit: +2 for physical assistance;Max assist Sit to supine: Mod assist;+2 for physical assistance   General bed mobility comments: pt requires extra time to upright trunk for supine>sit, attempts to place BLE onto bed but ultimately requires assistance, total assist for scooting to [facility] but is able to transfer supine>long sit in bed without assistance when pt wants to    Transfers Overall transfer level: Needs assistance Equipment used: 1 person hand held assist Transfers: Sit to/from Stand Sit to Stand: Max assist;+2 physical assistance         General transfer comment: sit<>stand x5 trials decreasing from ~1 min to ~20"    Balance Overall balance assessment: Needs assistance Sitting-balance support: Feet supported;Bilateral upper extremity supported Sitting balance-Leahy Scale: Poor Sitting balance - Comments: initially max assist for static sitting balance EOB but able to progress to very close SBA   Standing balance support: During functional activity;Single extremity supported Standing balance-Leahy Scale: Zero Standing balance comment: LUE support & max assist +  2 for static standing EOB          ADL either performed or assessed with clinical judgement   ADL Overall ADL's : Needs assistance/impaired         General ADL Comments: close SBA face washing seated EOB. MOD A self-drinking. MAX A x2 for perihygiene in standing                   Pertinent Vitals/Pain Pain Assessment: Faces Faces Pain Scale: No hurt Pain Descriptors / Indicators: Aching;Discomfort;Dull Pain Intervention(s): Limited activity within patient's tolerance;Repositioned     Hand Dominance Right   Extremity/Trunk Assessment Upper Extremity Assessment Upper Extremity Assessment: RUE deficits/detail;Generalized weakness RUE Deficits / Details: recent clavicle fx   Lower Extremity Assessment Lower Extremity Assessment: Generalized weakness       Communication Communication Communication: Tracheostomy (no speaking valve)   Cognition Arousal/Alertness: Awake/alert Behavior During Therapy: Anxious Overall Cognitive Status: No family/caregiver present to determine baseline cognitive functioning        General Comments: Pt very anxious, repeating "I'm scared" and "help me" during session with pt reporting fear of falling but unable to elaborate further. Pt does provide some PLOF information that is consistent with chart. Pt attempts to report we're somewhere in "[place]" but unable to provide orientation answers   General Comments  SpO2 93% on RA - pt requests trach collar for mobility, SpO2 96% on trach collar    Exercises Exercises: Other exercises Other Exercises Other Exercises: Pt educated re: OT role, DME recs, d/c recs, falls prevention, ECS Other Exercises: LBD, toileting, face washing, self-drinking/feeding, sup<>sit, sit<>stand x5, sitting/standing balance/tolerance   Shoulder Instructions      Home Living Family/patient expects to be discharged to:: Private residence Living Arrangements: Spouse/significant other Available Help at Discharge: Family;Available 24 hours/day Type of Home: House Home Access: Ramped entrance     Home Layout: One level     Bathroom Shower/Tub: Walk-in shower          Home Equipment: Environmental consultant - 2 wheels;Wheelchair - manual;Cane - single point;Grab bars - toilet;Other (comment)          Prior Functioning/Environment Level of Independence: Independent with assistive device(s)        Comments: Pt ambulatory with SPC, uses depends, husband assists with some ADL's. No family present at time of eval, pt able to provide some info & some PLOF obtained from chart.        OT Problem List: Decreased strength;Decreased range of motion;Decreased activity tolerance;Impaired balance (sitting and/or standing);Decreased safety awareness;Decreased cognition      OT Treatment/Interventions: Self-care/ADL training;Therapeutic exercise;Energy conservation;DME and/or AE instruction;Therapeutic activities;Patient/family education;Balance training    OT Goals(Current goals can be found in the care plan section) Acute Rehab OT Goals Patient Stated Goal: none stated OT Goal Formulation: Patient unable to participate in goal setting Time For Goal Achievement: 01/31/20 Potential to Achieve Goals: Fair ADL Goals Pt Will Perform Grooming: with supervision;sitting Pt Will Perform Lower Body Dressing: with min assist;sitting/lateral leans Pt Will Transfer to Toilet: with mod assist;stand pivot transfer;bedside commode (c LRAD PRN)  OT Frequency: Min 1X/week   Barriers to D/C:            Co-evaluation PT/OT/SLP Co-Evaluation/Treatment: Yes Reason for Co-Treatment: Complexity of the patient's impairments (multi-system involvement);Necessary to address cognition/behavior during functional activity;For patient/therapist safety;To address functional/ADL transfers PT goals addressed during session: Mobility/safety with mobility;Balance;Strengthening/ROM OT goals addressed during session: ADL's and self-care;Strengthening/ROM      AM-PAC OT "6 Clicks"  Daily Activity     Outcome Measure Help from another person eating meals?: A Little Help from another person  taking care of personal grooming?: A Little Help from another person toileting, which includes using toliet, bedpan, or urinal?: A Lot Help from another person bathing (including washing, rinsing, drying)?: A Lot Help from another person to put on and taking off regular upper body clothing?: A Little Help from another person to put on and taking off regular lower body clothing?: A Lot 6 Click Score: 15   End of Session Equipment Utilized During Treatment: Gait belt Nurse Communication: Mobility status;Other (comment) (aspiration concerns )  Activity Tolerance: Patient tolerated treatment well Patient left: in bed;with call bell/phone within reach;with bed alarm set  OT Visit Diagnosis: Other abnormalities of gait and mobility (R26.89);Muscle weakness (generalized) (M62.81)                Time: 2956-2130 OT Time Calculation (min): 44 min Charges:  OT General Charges $OT Visit: 1 Visit OT Evaluation $OT Eval Moderate Complexity: 1 Mod OT Treatments $Self Care/Home Management : 23-37 mins  Dessie Coma, M.S. OTR/L  01/17/20, 2:00 PM  ascom [phone]

## 2020-01-17 NOTE — Evaluation (Signed)
Physical Therapy Evaluation Patient Details Name: Angela David MRN: [id number] DOB: 1940-03-02 Today's Date: 01/17/2020   History of Present Illness  Pt is a 79 y/o F admitted on 01/15/20 with c/o hallucinations x 1 week. Imaging negative. Pt admitted for tx of acute encephalopathy of unclear etiology but suspect due to RLL PNA. (Pt also recently seen in ED on 01/11/20 s/p fall & found to have R clavicular fx & sent home with R shoulder immobilizer, instructed to f/u with ortho on OP basis.) PMH: R breast CA (2002), DM, peripheral neuropathy, glaucoma, high cholesterol, vocal cord paralysis  Clinical Impression  Pt seen for co-tx with OT for PT evaluation. Pt received in bed, opening eyes & responding to PT but at times writhing/jerking but no apparent signs of pain - nurse made aware. Pt is able to provide some PLOF information in conjunction with chart & it appears pt was somewhat ambulatory with [facility] with multiple falls PTA. At this time pt requires +2 assist for bed mobility & sit<>stand transfers at EOB. Pt incontinent of BM & unaware; pt requires multiple attempts at standing to allow OT to perform peri hygiene dependently with pt requiring seated rest breaks frequently during task. Pt very anxious, reporting fear of falling & stating "help me" but unable to elaborate. Pt on humidified air but with trach collar removed SpO2 = 90-93%, HR = 111 bpm at rest (nurse made aware). Pt appears to be less anxious with trach collar of humidified air donned. HR does increase to 134 bpm with mobility. At this time, recommending SNF level of care at d/c to focus on deficits noted. Will continue to see pt acutely to focus on strengthening, balance, transfers, & gait as able.   Of note, OT provided pt with small bite of eggs during session but pt pockets food, chews for prolonged time and ultimately spits them out -  OT notified nurse of pt's questionable ability to safely consume food & suggests SLP  evaluation.     Follow Up Recommendations SNF;Supervision/Assistance - 24 hour    Equipment Recommendations  None recommended by PT    Recommendations for Other Services       Precautions / Restrictions Precautions Precautions: Fall Required Braces or Orthoses: Other Brace (pt sent home with R shoulder immobilizer on 01/11/20 but none in room at time of eval) Restrictions Other Position/Activity Restrictions: Pt with findings of R clavicle fx on 01/11/20, sent home with shoulder immobilizer & told to f/u with ortho as outpatient but unable to do this since readmission to hospital. Therapy electing to maintain NWB, no ROM R shoulder      Mobility  Bed Mobility Overal bed mobility: Needs Assistance Bed Mobility: Supine to Sit;Sit to Supine;Rolling Rolling: Max assist (bed rails)   Supine to sit: +2 for physical assistance;Max assist Sit to supine: Mod assist;+2 for physical assistance   General bed mobility comments: pt requires extra time to upright trunk for supine>sit, attempts to place BLE onto bed but ultimately requires assistance, total assist for scooting to [facility] but is able to transfer supine>long sit in bed without assistance when pt wants to    Transfers Overall transfer level: Needs assistance Equipment used: 2 person hand held assist;1 person hand held assist Transfers: Sit to/from Stand Sit to Stand: Max assist;+2 physical assistance         General transfer comment: sit<>stand from EOB multiple times during session  Ambulation/Gait   Gait Distance (Feet):  (attempts to take side step  to R at EOB with pt able to move LLE but ultimately requiring assist to safely return to sitting on EOB)            Stairs            Wheelchair Mobility    Modified Rankin (Stroke Patients Only)       Balance Overall balance assessment: Needs assistance Sitting-balance support: Feet supported;Bilateral upper extremity supported Sitting balance-Leahy Scale:  Poor Sitting balance - Comments: initially max assist for static sitting balance EOB but able to progress to very close supervision<>CGA   Standing balance support: Bilateral upper extremity supported;During functional activity Standing balance-Leahy Scale: Zero Standing balance comment: BUE support & max assist +2 for static standing EOB                             Pertinent Vitals/Pain Pain Assessment: Faces Faces Pain Scale: No hurt (no overt behaviors demonstrating pain)    Home Living Family/patient expects to be discharged to:: Private residence Living Arrangements: Spouse/significant other Available Help at Discharge: Family;Available 24 hours/day Type of Home: House Home Access: Ramped entrance     Home Layout: One level Home Equipment: Walker - 2 wheels;Wheelchair - manual;Cane - single point;Grab bars - toilet;Other (comment)      Prior Function           Comments: Pt ambulatory with SPC, uses depends, husband assists with some ADL's. No family present at time of eval, pt able to provide some info & some PLOF obtained from chart.     Hand Dominance        Extremity/Trunk Assessment   Upper Extremity Assessment Upper Extremity Assessment: Generalized weakness (RUE not assessed 2/2 recent R clavicle fx)    Lower Extremity Assessment Lower Extremity Assessment: Generalized weakness    Cervical / Trunk Assessment Cervical / Trunk Assessment:  (rounded shoulders)  Communication   Communication: Tracheostomy (no speaking valve)  Cognition Arousal/Alertness: Awake/alert Behavior During Therapy: Anxious Overall Cognitive Status: No family/caregiver present to determine baseline cognitive functioning (Pt does provide some PLOF information that is consistent with chart. Pt attempts to report we're somewhere in "[place]" but unable to provide orientation answers)                                 General Comments: Pt very anxious,  repeating "I'm scared" and "help me" during session with pt reporting fear of falling but unable to elaborate further      General Comments      Exercises     Assessment/Plan    PT Assessment Patient needs continued PT services  PT Problem List Decreased strength;Decreased balance;Decreased cognition;Decreased knowledge of precautions;Cardiopulmonary status limiting activity;Decreased knowledge of use of DME;Decreased mobility;Decreased range of motion;Decreased safety awareness;Decreased coordination;Decreased activity tolerance       PT Treatment Interventions DME instruction;Functional mobility training;Balance training;Patient/family education;Modalities;Gait training;Therapeutic activities;Neuromuscular re-education;Wheelchair mobility training;Cognitive remediation;Manual techniques;Therapeutic exercise;Stair training    PT Goals (Current goals can be found in the Care Plan section)  Acute Rehab PT Goals Patient Stated Goal: none stated PT Goal Formulation: Patient unable to participate in goal setting Time For Goal Achievement: 01/31/20 Potential to Achieve Goals: Fair    Frequency Min 2X/week   Barriers to discharge Decreased caregiver support unsure if family can provide extensive physical assist at d/c    Co-evaluation PT/OT/SLP Co-Evaluation/Treatment: Yes Reason for Co-Treatment: Complexity  of the patient's impairments (multi-system involvement);Necessary to address cognition/behavior during functional activity;For patient/therapist safety;To address functional/ADL transfers PT goals addressed during session: Mobility/safety with mobility;Balance;Strengthening/ROM OT goals addressed during session: ADL's and self-care;Strengthening/ROM       AM-PAC PT "6 Clicks" Mobility  Outcome Measure Help needed turning from your back to your side while in a flat bed without using bedrails?: A Lot Help needed moving from lying on your back to sitting on the side of a flat bed  without using bedrails?: Total Help needed moving to and from a bed to a chair (including a wheelchair)?: Total Help needed standing up from a chair using your arms (e.g., wheelchair or bedside chair)?: Total Help needed to walk in hospital room?: Total Help needed climbing 3-5 steps with a railing? : Total 6 Click Score: 7    End of Session Equipment Utilized During Treatment: Gait belt (humidified air via trach collar (5L/min, 28%)) Activity Tolerance: Patient tolerated treatment well;Patient limited by fatigue (pt very fearful of falling) Patient left: in bed;with bed alarm set;with call bell/phone within reach Nurse Communication: Mobility status (decreased safety with breakfast) PT Visit Diagnosis: Muscle weakness (generalized) (M62.81);History of falling (Z91.81);Unsteadiness on feet (R26.81);Difficulty in walking, not elsewhere classified (R26.2)    Time: 6967-8938 PT Time Calculation (min) (ACUTE ONLY): 47 min   Charges:   PT Evaluation $PT Eval High Complexity: 1 High PT Treatments $Therapeutic Activity: 8-22 mins        Lavone Nian, PT, DPT 01/17/20, 10:05 AM   Waunita Schooner 01/17/2020, 10:01 AM

## 2020-01-18 ENCOUNTER — Inpatient Hospital Stay: Payer: Medicare Other

## 2020-01-18 DIAGNOSIS — Z930 Tracheostomy status: Secondary | ICD-10-CM

## 2020-01-18 LAB — BASIC METABOLIC PANEL
Anion gap: 12 (ref 5–15)
BUN: 15 mg/dL (ref 8–23)
CO2: 23 mmol/L (ref 22–32)
Calcium: 9 mg/dL (ref 8.9–10.3)
Chloride: 102 mmol/L (ref 98–111)
Creatinine, Ser: 0.77 mg/dL (ref 0.44–1.00)
GFR, Estimated: >60 mL/min (ref >60)
Glucose, Bld: 163 mg/dL — ABNORMAL HIGH (ref 70–99)
Potassium: 3.5 mmol/L (ref 3.5–5.1)
Sodium: 137 mmol/L (ref 135–145)

## 2020-01-18 LAB — GLUCOSE, CAPILLARY
Glucose-Capillary: 156 mg/dL — ABNORMAL HIGH (ref 70–99)
Glucose-Capillary: 129 mg/dL — ABNORMAL HIGH (ref 70–99)
Glucose-Capillary: 132 mg/dL — ABNORMAL HIGH (ref 70–99)
Glucose-Capillary: 109 mg/dL — ABNORMAL HIGH (ref 70–99)
Glucose-Capillary: 123 mg/dL — ABNORMAL HIGH (ref 70–99)

## 2020-01-18 LAB — PROCALCITONIN: Procalcitonin: 0.12 ng/mL

## 2020-01-18 MED ORDER — SODIUM CHLORIDE 0.9 % IV SOLN
3.0000 g | Freq: Four times a day (QID) | INTRAVENOUS | Status: DC
  Administered 2020-01-18 – 2020-01-21 (×13): 3 g via INTRAVENOUS
  Filled 2020-01-18: qty 3
  Filled 2020-01-18: qty 8
  Filled 2020-01-18: qty 1.32
  Filled 2020-01-18: qty 0.86
  Filled 2020-01-18: qty 8
  Filled 2020-01-18: qty 0.86
  Filled 2020-01-18: qty 8
  Filled 2020-01-18: qty 0.86
  Filled 2020-01-18 (×2): qty 3
  Filled 2020-01-18: qty 1.32
  Filled 2020-01-18: qty 8
  Filled 2020-01-18: qty 3
  Filled 2020-01-18: qty 8
  Filled 2020-01-18 (×2): qty 0.86
  Filled 2020-01-18: qty 3
  Filled 2020-01-18 (×2): qty 8

## 2020-01-18 NOTE — Progress Notes (Signed)
Cross Cover Was notified by RN patient's husbanc at bedside with concerns of patients mental status change and increased secretions.  Chest xray last on 11/19 FINDINGS: The heart size and mediastinal contours are within normal limits. Tracheostomy tube is seen at the level of the clavicular heads. Again noted is elevation of the right hemidiaphragm. There is blunting of the left costophrenic angle which be due to combination of effusion and airspace opacity. IMPRESSION: Left pleural effusion with adjacent patchy airspace opacity which could be due to atelectasis and/or infectious etiology.  No leukocytosis present and procalcitonin normal; vital signssignificant for tachycardia, low grade fever and tachypnea Lactic done previously normal  VBG done earlier 11/21 normal DDimer mild elevated  CT to eval for early pneumonia process as there is great concern with increased secretion and tracheostomy and although there is not high risk for PE, d dimer is mildly elevated, di CTA to take PE off the differential   IMPRESSION: 1. No evidence of significant pulmonary embolus. 2. Focal consolidation or atelectasis in the left lung base. Small patchy infiltrative areas demonstrated in the left lingula. Changes may represent inflammatory process or aspiration. 3. Fibrosis and scarring in the lung apices, greater on the right. 4. Multiple subcentimeter nodules throughout the right lung. These were also present on the previous study. 5. Cardiac enlargement. 6. Dilated esophagus containing air and fluid. This may represent dysmotility or possibly outlet obstruction. Similar finding on previous study. 7. Tracheostomy tube. 8. Aortic atherosclerosis.  Aortic Atherosclerosis (ICD10-I70.0).  Antibiotics changed to Unasyn

## 2020-01-18 NOTE — Progress Notes (Signed)
   01/18/20 0000  Vitals  Temp 98.6 F (37 C)  Temp Source Axillary  BP (!) 200/168  MAP (mmHg) 180  BP Location Left Leg  BP Method Automatic  Patient Position (if appropriate) Lying  Pulse Rate 99  Pulse Rate Source Monitor  ECG Heart Rate (!) 105  Resp (!) 21  Level of Consciousness  Level of Consciousness Responds to Voice  Oxygen Therapy  SpO2 98 %  O2 Device Tracheostomy Collar  O2 Flow Rate (L/min) 5 L/min  FiO2 (%) 28 %  Patient Activity (if Appropriate) In bed  Pulse Oximetry Type Continuous  MEWS Score  MEWS Temp 0  MEWS Systolic 0  MEWS Pulse 1  MEWS RR 1  MEWS LOC 1  MEWS Score 3  MEWS Score Color Yellow   10mg  labetalol given for elevated BP per PRN order.

## 2020-01-18 NOTE — Progress Notes (Signed)
Pt more alert at this time, tracking visually, nodding and mouthing appropriate responses to questions, moving all 4 extremities to command.

## 2020-01-18 NOTE — Progress Notes (Signed)
PROGRESS NOTE    Angela David   LZJ:[id number]  DOB: [birth year]  PCP: Iva Boop, MD    DOA: 01/15/2020 LOS: 2   Brief Narrative   Angela David is a 79 y.o. female with medical history significant for hypertension, diabetes, history of trachea stenosis status post tracheostomy who presented to the ED on 01/15/20 with mental status changes with disorientation, not recognizing family, generalized weakness for several days.    Evaluation in the ED was mostly unrevealing except for possible right lower lobe pneumonia.  Patient improving with IV antibiotics.  However, patient is without respiratory symptoms, trach secretions or leukocytosis.  Procalcitonin normal. She does, however, report history of what sounds like aspiration at times.  History of prior admission in January this year for AMS and found hypercapneic.  Pt says her confusion and weakness this time resembles how she felt back then.  VBG obtained in the ED was normal.  Ammonia level normal.  UA negative.  CT head negative.   11/20 - admit to inpatient given ongoing hallucinations today.       Assessment & Plan   Principal Problem:   Acute encephalopathy Active Problems:   Left lower lobe pneumonia   Gait abnormality   Anxiety state   HLD (hyperlipidemia)   Tracheostomy present (HCC)   Type 2 diabetes mellitus (HCC)   Peripheral neuropathy    Acute encephalopathy / AMS - likely due to delirium with infection with RLL PNA, possibly underlying mild vascular dementia given MRI findings.  VBG normal without hypercapnea.  Normal ammonia level. TSH normal.  CT head negative for anything acute.  UA negative.  Chest xray with LLL opacity and pleural effusion.  Vitamin D normal and B12 elevated.  CVA in the differential but neuro exam is nonfocal, MRI was negative for CVA.  No hx of seizure. Chart review showed ED visit on 11/11 after a fall, head CT negative for acute findings then as well.   MRI  brain 11/20 showed only advanced chronic microvascular disease and atrophy, no stroke, mass or other findings to explain her confusion and hallucinations. Suspect patient has vascular or some form of dementia, now with superimposed delirium with infection. --Neuro checks --Treat for PNA as below --Will monitor for now and consider neuro or psych consult if hallucinations and confusion persist   Left lower lobe pneumonia / Aspiration / Left pleural effusion - POA by imaing.  Possibly causing her confusion and hallucinations.  However, no respiratory symptoms, no leukocytosis and normal procal.  Endorses some swallowing trouble at times. 11/21 - now with purulent appearing trach secretions --Continue Unasyn --SLP Eval, plan for modified barium swallow tomorrow if pt awake and alert --Monitor O2 sat and provide supplemental O2 if needed to keep sats > 88% --get sputum culture, blood culture --Continue close monitoring in stepdown  ?Aspiration / Dysphagia - NPO.  Hold PO meds.  Maintenance IV fluids.  SLP eval pending.  Generalized weakness / Gait abnormality - possibly due to infection.  PT [name] evaluations.  Recent Fall / Right distal clavicular fracture - no complaint of pain from patient, discovered this on chart review when seen in ED 11/11 after a fall.  She was placed in sling and to follow up with ortho in 1 week, but ended up admitted.   Pt told ED physician she doesn't tolerate pain meds, okay just with Tylenol. --Monitor  Elevated D Dimer - POA, patient has no tachycardia or hypoxia, doubt PE.  Lower extremity dopplers were negative.  Suspect elevated due to PNA.  Anxiety / Depression - continue home Buspar, Lexapro  Hyperlipidemia - continue statin  Tracheostomy present - no acute issues.  Monitor.  Trach collar O2 as needed.  Type 2 diabetes mellitus - hold metformin.  Sliding scale Novolog.  Last A1c in Jan 2021 was 6.6%.  Peripheral neuropathy - hold gabapentin due to  AMS, continue Requip for ?RLS  GERD - continue PPI   DVT prophylaxis: enoxaparin (LOVENOX) injection 40 mg Start: 01/15/20 2245 Place TED hose Start: 01/15/20 2237   Diet:  Diet Orders (From admission, onward)    Start     Ordered   01/17/20 1209  Diet NPO time specified  Diet effective now        01/17/20 1211            Code Status: Full Code    Subjective 01/18/20    Pt seen at bedside today in stepdown unit.  She is awake and alert and able to answer questions today.  Denies shortness of breath, says secretions better today.  No N/V, CP, fever/chills or other acute complaints.  Thankful she is being watched closely.   Disposition Plan & Communication   Status is: Inpatient  Remains inpatient appropriate because:IV treatments appropriate due to intensity of illness or inability to take PO.  Unsafe swallowing, NPO pending speech eval.  Remains on IV antibiotics.  May require SNF placement.   Dispo: The patient is from: Home              Anticipated d/c is to: Home              Anticipated d/c date is: 3 days              Patient currently is not medically stable to d/c.    Family Communication: Niece updated at bedside on afternoon 11/21.  Will call or see at bedside later today for update.   Consults, Procedures, Significant Events   Consultants:   None  Procedures:   None  Antimicrobials:  Anti-infectives (From admission, onward)   Start     Dose/Rate Route Frequency Ordered Stop   01/18/20 0900  fluconazole (DIFLUCAN) tablet 150 mg        150 mg Oral Once per day on Mon Thu 01/15/20 2239     01/18/20 0300  Ampicillin-Sulbactam (UNASYN) 3 g in sodium chloride 0.9 % 100 mL IVPB        3 g 200 mL/hr over 30 Minutes Intravenous Every 6 hours 01/18/20 0212     01/16/20 0345  azithromycin (ZITHROMAX) 500 mg in sodium chloride 0.9 % 250 mL IVPB  Status:  Discontinued        500 mg 250 mL/hr over 60 Minutes Intravenous Every 24 hours 01/16/20 0252 01/18/20  0200   01/16/20 0345  cefTRIAXone (ROCEPHIN) 1 g in sodium chloride 0.9 % 100 mL IVPB        1 g 200 mL/hr over 30 Minutes Intravenous Every 24 hours 01/16/20 0252           Objective   Vitals:   01/18/20 0600 01/18/20 0700 01/18/20 0736 01/18/20 0800  BP: (!) 148/120 (!) 130/98  (!) 144/126  Pulse: 100 92  87  Resp:      Temp:   98.6 F (37 C)   TempSrc:   Axillary   SpO2: 99% 99%  100%  Weight:      Height:  Intake/Output Summary (Last 24 hours) at 01/18/2020 1104 Last data filed at 01/18/2020 0600 Gross per 24 hour  Intake 1125.3 ml  Output 350 ml  Net 775.3 ml   Filed Weights   01/15/20 1341 01/17/20 1700  Weight: 70.3 kg 72 kg    Physical Exam:  General exam: sleeping but responsive only briefly, no acute distress HEENT: trach in place currently with no visible secretions, moist mucus membranes, hearing grossly normal Respiratory system: overall clear but diminished, no wheezes or rhonchi, trach collar in place with supplemental oxygen Cardiovascular system: normal S1/S2, RRR, no pedal edema.   Central nervous system: no gross focal neurologic deficits, normal speech Extremities: no cyanosis, no edema    Labs   Data Reviewed: I have personally reviewed following labs and imaging studies  CBC: Recent Labs  Lab 01/15/20 1343 01/16/20 0512 01/17/20 0436  WBC 8.7 7.4 7.8  NEUTROABS  --   --  5.9  HGB 12.6 11.3* 12.8  HCT 40.7 35.6* 39.7  MCV 84.6 81.7 79.9*  PLT 206 201 277   Basic Metabolic Panel: Recent Labs  Lab 01/15/20 1343 01/16/20 0512 01/17/20 0436 01/18/20 0254  NA 138 136 137 137  K 4.4 3.9 3.7 3.5  CL 104 102 105 102  CO2 23 26 24 23   GLUCOSE 183* 138* 182* 163*  BUN 20 17 18 15   CREATININE 0.89 0.91 0.95 0.77  CALCIUM 9.4 9.0 10.2 9.0  MG  --   --  1.7  --    GFR: Estimated Creatinine Clearance: 57.3 mL/min (by C-G formula based on SCr of 0.77 mg/dL). Liver Function Tests: Recent Labs  Lab 01/15/20 1343  AST 26   ALT 21  ALKPHOS 62  BILITOT 0.6  PROT 7.5  ALBUMIN 3.8   No results for input(s): LIPASE, AMYLASE in the last 168 hours. Recent Labs  Lab 01/16/20 0512  AMMONIA 21   Coagulation Profile: No results for input(s): INR, PROTIME in the last 168 hours. Cardiac Enzymes: No results for input(s): CKTOTAL, CKMB, CKMBINDEX, TROPONINI in the last 168 hours. BNP (last 3 results) No results for input(s): PROBNP in the last 8760 hours. HbA1C: No results for input(s): HGBA1C in the last 72 hours. CBG: Recent Labs  Lab 01/17/20 1700 01/17/20 2355 01/18/20 0330 01/18/20 0748  GLUCAP 160* 146* 156* 129*   Lipid Profile: No results for input(s): CHOL, HDL, LDLCALC, TRIG, CHOLHDL, LDLDIRECT in the last 72 hours. Thyroid Function Tests: Recent Labs    01/16/20 0512  TSH 2.454   Anemia Panel: Recent Labs    01/16/20 0512  VITAMINB12 1,970*   Sepsis Labs: Recent Labs  Lab 01/15/20 1343 01/18/20 0254  PROCALCITON <0.10 0.12    Recent Results (from the past 240 hour(s))  Resp Panel by RT-PCR (Flu A&B, Covid) Nasopharyngeal Swab     Status: None   Collection Time: 01/15/20  5:11 PM   Specimen: Nasopharyngeal Swab; Nasopharyngeal(NP) swabs in vial transport medium  Result Value Ref Range Status   SARS Coronavirus 2 by RT PCR NEGATIVE NEGATIVE Final    Comment: (NOTE) SARS-CoV-2 target nucleic acids are NOT DETECTED.  The SARS-CoV-2 RNA is generally detectable in upper respiratory specimens during the acute phase of infection. The lowest concentration of SARS-CoV-2 viral copies this assay can detect is 138 copies/mL. A negative result does not preclude SARS-Cov-2 infection and should not be used as the sole basis for treatment or other patient management decisions. A negative result may occur with  improper specimen  collection/handling, submission of specimen other than nasopharyngeal swab, presence of viral mutation(s) within the areas targeted by this assay, and inadequate  number of viral copies(<138 copies/mL). A negative result must be combined with clinical observations, patient history, and epidemiological information. The expected result is Negative.  Fact Sheet for Patients:  EntrepreneurPulse.com.au  Fact Sheet for Healthcare Providers:  IncredibleEmployment.be  This test is no t yet approved or cleared by the [place] FDA and  has been authorized for detection and/or diagnosis of SARS-CoV-2 by FDA under an Emergency Use Authorization (EUA). This EUA will remain  in effect (meaning this test can be used) for the duration of the COVID-19 declaration under Section 564(b)(1) of the Act, 21 U.S.C.section 360bbb-3(b)(1), unless the authorization is terminated  or revoked sooner.       Influenza A by PCR NEGATIVE NEGATIVE Final   Influenza B by PCR NEGATIVE NEGATIVE Final    Comment: (NOTE) The Xpert Xpress SARS-CoV-2/FLU/RSV plus assay is intended as an aid in the diagnosis of influenza from Nasopharyngeal swab specimens and should not be used as a sole basis for treatment. Nasal washings and aspirates are unacceptable for Xpert Xpress SARS-CoV-2/FLU/RSV testing.  Fact Sheet for Patients: EntrepreneurPulse.com.au  Fact Sheet for Healthcare Providers: IncredibleEmployment.be  This test is not yet approved or cleared by the [place] FDA and has been authorized for detection and/or diagnosis of SARS-CoV-2 by FDA under an Emergency Use Authorization (EUA). This EUA will remain in effect (meaning this test can be used) for the duration of the COVID-19 declaration under Section 564(b)(1) of the Act, 21 U.S.C. section 360bbb-3(b)(1), unless the authorization is terminated or revoked.  Performed at [facility], [address] [place], [place] [address]   Culture, blood (single) w Reflex to ID Panel     Status: None (Preliminary result)   Collection  Time: 01/17/20 12:48 PM   Specimen: BLOOD  Result Value Ref Range Status   Specimen Description BLOOD BLLA  Final   Special Requests   Final    BOTTLES DRAWN AEROBIC AND ANAEROBIC Blood Culture adequate volume   Culture   Final    NO GROWTH < 24 HOURS Performed at [facility], [address] [place], [place] [address]    Report Status PENDING  Incomplete  Expectorated sputum assessment w rflx to resp cult     Status: None   Collection Time: 01/17/20  3:26 PM   Specimen: Sputum  Result Value Ref Range Status   Specimen Description   Final    SPUTUM Performed at [facility], [address] [place], [place] [address]    Special Requests   Final    NONE Performed at [facility], [address] [place], [place] [address]    Sputum evaluation   Final    THIS SPECIMEN IS ACCEPTABLE FOR SPUTUM CULTURE Performed at [facility], [place] [address], [place], [place] [address]    Report Status 01/17/2020 FINAL  Final  Culture, respiratory     Status: None (Preliminary result)   Collection Time: 01/17/20  3:26 PM   Specimen: SPU  Result Value Ref Range Status   Specimen Description SPUTUM  Final   Special Requests NONE Reflexed from [id number]  Final   Gram Stain   Final    MODERATE WBC PRESENT, PREDOMINANTLY PMN FEW GRAM POSITIVE RODS FEW GRAM NEGATIVE RODS FEW GRAM POSITIVE COCCI IN PAIRS IN CLUSTERS    Culture   Final  TOO YOUNG TO READ Performed at [facility], [place] [address] [place], [place] [address]    Report Status PENDING  Incomplete  MRSA PCR Screening     Status: None   Collection Time: 01/17/20  5:00 PM   Specimen: Nasopharyngeal  Result Value Ref Range Status   MRSA by PCR NEGATIVE NEGATIVE Final    Comment:        The GeneXpert MRSA Assay (FDA approved for NASAL specimens only), is one component of a comprehensive MRSA colonization surveillance program. It is not intended to diagnose MRSA infection nor to guide  or monitor treatment for MRSA infections. Performed at [facility], [address] [place], [place] [address]       Imaging Studies   CT ANGIO CHEST PE W OR WO CONTRAST  Result Date: 01/17/2020 CLINICAL DATA:  Suspected pulmonary embolus with low to intermediate probability. Positive D-dimer. Altered mental status. EXAM: CT ANGIOGRAPHY CHEST WITH CONTRAST TECHNIQUE: Multidetector CT imaging of the chest was performed using the standard protocol during bolus administration of intravenous contrast. Multiplanar CT image reconstructions and MIPs were obtained to evaluate the vascular anatomy. CONTRAST:  22mL OMNIPAQUE IOHEXOL 350 MG/ML SOLN COMPARISON:  03/26/2019 FINDINGS: Cardiovascular: There is good opacification of the central and segmental pulmonary arteries. No focal filling defects. No evidence of significant pulmonary embolus. Cardiac enlargement. No pericardial effusions. Coronary artery and aortic calcifications. Normal caliber thoracic aorta. No aortic dissection. Great vessel origins are patent. Mediastinum/Nodes: The esophagus is diffusely dilated and contains air and fluid. This may represent dysmotility or possibly outlet obstruction. Similar finding on previous study suggest chronic process. No significant lymphadenopathy. A tracheostomy tube is present. Lungs/Pleura: Fibrosis and scarring in the lung apices, greater on the right. Mild peripheral interstitial pattern likely also represents fibrosis. There are multiple subcentimeter nodules demonstrated throughout the right lung. These were also present on the previous study. Focal consolidation or atelectasis in the left lung base. Small patchy infiltrative areas demonstrated in the left lingula. Changes may represent inflammatory process or aspiration. No pleural effusions. No pneumothorax. Upper Abdomen: Surgical absence of the gallbladder. No acute process demonstrated in the visualized upper abdomen. Musculoskeletal:  Degenerative changes in the spine. Old deformity of the sternum, likely posttraumatic. No destructive bone lesions or acute changes identified. Review of the MIP images confirms the above findings. IMPRESSION: 1. No evidence of significant pulmonary embolus. 2. Focal consolidation or atelectasis in the left lung base. Small patchy infiltrative areas demonstrated in the left lingula. Changes may represent inflammatory process or aspiration. 3. Fibrosis and scarring in the lung apices, greater on the right. 4. Multiple subcentimeter nodules throughout the right lung. These were also present on the previous study. 5. Cardiac enlargement. 6. Dilated esophagus containing air and fluid. This may represent dysmotility or possibly outlet obstruction. Similar finding on previous study. 7. Tracheostomy tube. 8. Aortic atherosclerosis. Aortic Atherosclerosis (ICD10-I70.0). Electronically Signed   By: Lucienne Capers M.D.   On: 01/17/2020 23:42   MR BRAIN WO CONTRAST  Result Date: 01/16/2020 CLINICAL DATA:  Mental status change EXAM: MRI HEAD WITHOUT CONTRAST TECHNIQUE: Multiplanar, multiecho pulse sequences of the brain and surrounding structures were obtained without intravenous contrast. COMPARISON:  03/21/2019 FINDINGS: Motion artifact is present. Brain: There is no acute infarction or intracranial hemorrhage. There is no intracranial mass, mass effect, or edema. There is no hydrocephalus or extra-axial fluid collection. Prominence of the ventricles and sulci reflects generalized parenchymal volume loss. Patchy and confluent areas of T2  hyperintensity in the supratentorial greater than pontine white matter are nonspecific but probably reflect advanced chronic microvascular ischemic changes. Vascular: Major vessel flow voids at the skull base are preserved. Skull and upper cervical spine: Normal marrow signal is preserved. Sinuses/Orbits: Paranasal sinuses are aerated. Orbits are unremarkable. Other: Sella is  unremarkable.  Mastoid air cells are clear. IMPRESSION: No acute infarction, hemorrhage, or mass. Advanced chronic microvascular ischemic changes. Electronically Signed   By: Macy Mis M.D.   On: 01/16/2020 20:02   DG Chest Port 1 View  Result Date: 01/18/2020 CLINICAL DATA:  Cough. EXAM: PORTABLE CHEST 1 VIEW COMPARISON:  Chest radiographs 01/15/2020 and CTA 01/17/2020 FINDINGS: The patient is rotated to the left. Tracheostomy tube overlies the airway. The cardiomediastinal silhouette is unchanged with normal heart size. There is persistent elevation of the right hemidiaphragm. Patchy left basilar airspace opacity has decreased. No sizable pleural effusion or pneumothorax is identified. Old rib fractures are noted. IMPRESSION: Improved left basilar aeration. Electronically Signed   By: Logan Bores M.D.   On: 01/18/2020 08:11   ECHOCARDIOGRAM COMPLETE  Result Date: 01/17/2020    ECHOCARDIOGRAM REPORT   Patient Name:   Petula SMALL Gault Date of Exam: 01/17/2020 Medical Rec #:  [id number]           Height:       64.0 in Accession #:    [id number]          Weight:       155.0 lb Date of Birth:  [birth year]          BSA:          1.756 m Patient Age:    59 years            BP:           168/81 mmHg Patient Gender: F                   HR:           105 bpm. Exam Location:  [facility] Procedure: 2D Echo, Cardiac Doppler and Color Doppler Indications:     Atrial fibrillation 427.31  History:         Patient has prior history of Echocardiogram examinations, most                  recent 03/24/2019. Risk Factors:Diabetes. High cholesterol.  Sonographer:     Sherrie Sport RDCS (AE) Referring Phys:  [id number] Claiborne Billings A Darrell Leonhardt Diagnosing Phys: Serafina Royals MD  Sonographer Comments: Technically difficult study due to poor echo windows, no subcostal window and no apical window. IMPRESSIONS  1. Left ventricular ejection fraction, by estimation, is 60 to 65%. The left ventricle has normal function. The left ventricle has no  regional wall motion abnormalities. Left ventricular diastolic function could not be evaluated.  2. Right ventricular systolic function is normal. The right ventricular size is normal.  3. The mitral valve is grossly normal. No evidence of mitral valve regurgitation.  4. The aortic valve is grossly normal. Aortic valve regurgitation is not visualized. FINDINGS  Left Ventricle: Left ventricular ejection fraction, by estimation, is 60 to 65%. The left ventricle has normal function. The left ventricle has no regional wall motion abnormalities. The left ventricular internal cavity size was normal in size. There is  no left ventricular hypertrophy. Left ventricular diastolic function could not be evaluated. Right Ventricle: The right ventricular size is normal. No increase in right ventricular wall thickness. Right ventricular systolic function  is normal. Left Atrium: Left atrial size was normal in size. Right Atrium: Right atrial size was normal in size. Pericardium: There is no evidence of pericardial effusion. Mitral Valve: The mitral valve is grossly normal. No evidence of mitral valve regurgitation. Tricuspid Valve: The tricuspid valve is grossly normal. Tricuspid valve regurgitation is not demonstrated. Aortic Valve: The aortic valve is grossly normal. Aortic valve regurgitation is not visualized. Pulmonic Valve: The pulmonic valve was not assessed. Pulmonic valve regurgitation is not visualized. Aorta: The aortic root and ascending aorta are structurally normal, with no evidence of dilitation. IAS/Shunts: The interatrial septum was not assessed.  LEFT VENTRICLE PLAX 2D LVIDd:         3.22 cm LVIDs:         1.78 cm LV PW:         1.10 cm LV IVS:        1.19 cm LVOT diam:     2.00 cm LVOT Area:     3.14 cm  LEFT ATRIUM         Index LA diam:    1.80 cm 1.03 cm/m   AORTA Ao Root diam: 2.30 cm  SHUNTS Systemic Diam: 2.00 cm Serafina Royals MD Electronically signed by Serafina Royals MD Signature Date/Time:  01/17/2020/8:39:33 AM    Final      Medications   Scheduled Meds: . Chlorhexidine Gluconate Cloth  6 each Topical Daily  . enoxaparin (LOVENOX) injection  40 mg Subcutaneous Q24H  . fluconazole  150 mg Oral Once per day on Mon Thu  . pantoprazole (PROTONIX) IV  40 mg Intravenous Daily  . pneumococcal 23 valent vaccine  0.5 mL Intramuscular Tomorrow-1000   Continuous Infusions: . sodium chloride 75 mL/hr at 01/18/20 0600  . ampicillin-sulbactam (UNASYN) IV 3 g (01/18/20 0820)  . cefTRIAXone (ROCEPHIN)  IV Stopped (01/18/20 0358)       LOS: 2 days    Time spent: 30 minutes with >50% spent in coordination of care and direct patient contact.    Ezekiel Slocumb, DO [facility]  01/18/2020, 11:04 AM    If 7PM-7AM, please contact night-coverage. How to contact the [facility] Attending or Consulting provider [place] or covering provider during after hours [place], for this patient?    1. Check the care team in [facility] and look for a) attending/consulting [facility] provider listed and b) the [facility] team listed 2. Log into [email or web address] and use [place]'s universal password to access. If you do not have the password, please contact the hospital operator. 3. Locate the [facility] provider you are looking for under [facility] and page to a number that you can be directly reached. 4. If you still have difficulty reaching the provider, please page the [facility] (Director on Call) for the Hospitalists listed on amion for assistance.

## 2020-01-18 NOTE — Evaluation (Signed)
Clinical/Bedside Swallow Evaluation Patient Details  Name: Angela David MRN: [id number] Date of Birth: [birth year]  Today's Date: 01/18/2020 Time: SLP Start Time (ACUTE ONLY): 0830 SLP Stop Time (ACUTE ONLY): 0859 SLP Time Calculation (min) (ACUTE ONLY): 29 min  Past Medical History:  Past Medical History:  Diagnosis Date  . Breast cancer ([place]) 2002   right breast chemo and radiation  . Cancer [facility]) 2002   right breast  . Diabetes mellitus without complication ([place])   . Diabetic peripheral neuropathy ([place]) 08/28/2016  . GERD (gastroesophageal reflux disease)   . Glaucoma   . High cholesterol   . History of colon polyps   . Peripheral neuropathy 08/28/2016  . Personal history of chemotherapy 2002   BREAST CA  . Personal history of malignant neoplasm of breast   . Personal history of radiation therapy 2002   BREAST CA  . Vocal cord paralysis 2012   Past Surgical History:  Past Surgical History:  Procedure Laterality Date  . ABDOMINAL HYSTERECTOMY    . BREAST BIOPSY Right 2008   neg  . BREAST EXCISIONAL BIOPSY Right 2002   positive  . BREAST LUMPECTOMY Right 2002   chemo and radiation  . BREAST SURGERY Right 2002   LUMPECTOMY, radiation, chemotherapy  . CHOLECYSTECTOMY N/A 12/10/2017   Procedure: LAPAROSCOPIC CHOLECYSTECTOMY;  Surgeon: Benjamine Sprague, DO;  Location: [facility] ORS;  Service: General;  Laterality: N/A;  . COLON RESECTION Right 11/06/2018   Procedure: HAND ASSISTED LAPAROSCOPIC COLON RESECTION, RIGHT;  Surgeon: Jules Husbands, MD;  Location: [facility] ORS;  Service: General;  Laterality: Right;  . COLONOSCOPY  2009,12/30/2013  . COLONOSCOPY WITH PROPOFOL N/A 04/16/2018   Tubulovillous polyp proximal transverse colon, large sessile polyp, proximal transverse colon;  Surgeon: Robert Bellow, MD;  Location: [facility] ENDOSCOPY;  Service: Endoscopy;  Laterality: N/A;  . ESOPHAGOGASTRODUODENOSCOPY (EGD) WITH PROPOFOL N/A 10/14/2018   Procedure: ESOPHAGOGASTRODUODENOSCOPY  (EGD) WITH PROPOFOL;  Surgeon: Lucilla Lame, MD;  Location: [facility] ENDOSCOPY;  Service: Endoscopy;  Laterality: N/A;  . HYDROCELE EXCISION / REPAIR    . TRACHEAL SURGERY  2013  . TRACHEOSTOMY N/A    Dr Kathyrn Sheriff   HPI:  Angela David is a 79 y.o. female with medical history significant for hypertension, diabetes, history of trachea stenosis status post tracheostomy who presented to the ED on 01/15/20 with mental status changes with disorientation, not recognizing family, generalized weakness for several days.  Pt has been chronically trach dependent since 2013 and is followed by [facility] Otolaryngology and Anda Latina (ENT, [facility]). Pertinant information: May 31, 2019: MBSS at [facility] - "Pt presents with a mild oral and mild to moderate pharyngeal dysphagia c/b reduced oral manipulation, delayed swallow initiation, reduced BOT retraction and reduced pharyngeal constriction. These led to increased oral transit time, vallecula residue (mild - thin liquids, moderate - solids), pyriform sinus residue (mild - thin liquids, moderate - solids and trace penetration of solids. Penetration cleared with swallows. Residue significantly reduced with liquid rinse and subsequent swallows. Cervical esophagus: accumulation of solids within the UES/cervical esophagus essentially cleared with liquid rinse and subsequent swallows." June 24, 2019: The patient underwent MDL with excision of suprastomal granulation tissue and tracheal balloon dilation, and esophagoscopy with UES balloon dilation. Currently pt has a fenestrated #6 Shiley with inability to tolerate inner cannula and PMSV but per her husband she can achieve phonation with finger occulsion. Per husband's report, she has difficulty swallowing with it varying between moderate to severe.    Assessment / Plan /  Recommendation Clinical Impression  ST consulted as pt had change in mental status, transferred to CCU with chest x-ray revealing improved left lung aeration,  and Dilated esophagus containing air and fluid that may represent dysmotility or possibly outlet obstruction. Similar finding on previous study. Pt is at a very high risk of aspirating. She appeared sleepy but easily aroused. At baseline, pt has open trach and finger occludes for phonation. No effort made by pt to finger occlude but she mouthed several words that were difficult to understand d/t aphonia.  When presented with ice chips, pt closed her eyes and it was difficult to tell if she had decreased alertness or was enjoying the ice chips. She produced 4 swallows per ice chip. Despite maximal cues, she was not able to hold her head in neutral position and had left labial spillage as a result. Recommend an instrumental study to assess airway protection with PO intake.  SLP Visit Diagnosis: Dysphagia, oropharyngeal phase (R13.12)    Aspiration Risk  Severe aspiration risk    Diet Recommendation NPO   Medication Administration: Via alternative means    Other  Recommendations Oral Care Recommendations: Oral care QID Other Recommendations: Prohibited food (jello, ice cream, thin soups);Remove water pitcher;Have oral suction available   Follow up Recommendations  (TBD)      Frequency and Duration min 2x/week  2 weeks       Prognosis Prognosis for Safe Diet Advancement: Fair Barriers to Reach Goals:  (chronic trach, inability to tolerate PMSV)      Swallow Study   General Date of Onset: 01/16/20 HPI: Angela David is a 79 y.o. female with medical history significant for hypertension, diabetes, history of trachea stenosis status post tracheostomy who presented to the ED on 01/15/20 with mental status changes with disorientation, not recognizing family, generalized weakness for several days.  Pt has been chronically trach dependent since 2013 and is followed by [facility] Otolaryngology and Anda Latina (ENT, [facility]). Pertinant information: May 31, 2019: MBSS at [facility] - "Pt presents with a  mild oral and mild to moderate pharyngeal dysphagia c/b reduced oral manipulation, delayed swallow initiation, reduced BOT retraction and reduced pharyngeal constriction. These led to increased oral transit time, vallecula residue (mild - thin liquids, moderate - solids), pyriform sinus residue (mild - thin liquids, moderate - solids and trace penetration of solids. Penetration cleared with swallows. Residue significantly reduced with liquid rinse and subsequent swallows. Cervical esophagus: accumulation of solids within the UES/cervical esophagus essentially cleared with liquid rinse and subsequent swallows." June 24, 2019: The patient underwent MDL with excision of suprastomal granulation tissue and tracheal balloon dilation, and esophagoscopy with UES balloon dilation. Currently pt has a fenestrated #6 Shiley with inability to tolerate inner cannula and PMSV but per her husband she can achieve phonation with finger occulsion. Per husband's report, she has difficulty swallowing with it varying between moderate to severe.  Type of Study: Bedside Swallow Evaluation Previous Swallow Assessment: multiple Diet Prior to this Study: NPO Temperature Spikes Noted: No Respiratory Status: Trach Collar;Trach Trach Size and Type: Uncuffed;#6;Fenestrated;With PMSV not in place History of Recent Intubation: No Behavior/Cognition: Pleasant mood Oral Cavity Assessment: Within Functional Limits Oral Care Completed by SLP: Recent completion by staff Oral Cavity - Dentition: Adequate natural dentition Self-Feeding Abilities: Needs assist Patient Positioning: Upright in bed Baseline Vocal Quality: Not observed (open trach, pt didn't attempt to finger occuld) Volitional Cough:  (unable d//t open trach) Volitional Swallow: Unable to elicit    Oral/Motor/Sensory Function  Overall Oral Motor/Sensory Function: Within functional limits   Ice Chips Ice chips: Impaired Presentation: Spoon Oral Phase Impairments: Reduced  lingual movement/coordination;Poor awareness of bolus;Reduced labial seal Oral Phase Functional Implications: Left anterior spillage;Prolonged oral transit;Oral holding Pharyngeal Phase Impairments: Multiple swallows   Thin Liquid Thin Liquid: Not tested    Nectar Thick Nectar Thick Liquid: Not tested   Honey Thick Honey Thick Liquid: Not tested   Puree Puree: Not tested   Solid     Solid: Not tested     Angela David B. Rutherford Nail M.S., CCC-SLP, [place] [facility] Office [phone]  Reann Dobias 01/18/2020,5:50 PM

## 2020-01-18 NOTE — Progress Notes (Signed)
PT Cancellation Note  Patient Details Name: Angela David MRN: [id number] DOB: 11/22/1940   Cancelled Treatment:    Reason Eval/Treat Not Completed: Other (comment). Chart reviewed & pt has transitioned to a higher level of care. Per PT protocol, will require new orders or continue at transfer to continue PT services. Will follow acutely & initiate services as medically appropriate.  Lavone Nian, PT, DPT 01/18/20, 8:22 AM   Waunita Schooner 01/18/2020, 8:21 AM

## 2020-01-18 NOTE — Progress Notes (Signed)
Initial Nutrition Assessment  DOCUMENTATION CODES:   Not applicable  INTERVENTION:  Will monitor for outcome of MBSS and diet recommendations from SLP vs need for nutrition support.  NUTRITION DIAGNOSIS:   Inadequate oral intake related to inability to eat as evidenced by NPO status.  GOAL:   Patient will meet greater than or equal to 90% of their needs  MONITOR:   Diet advancement, Labs, Weight trends, I & O's  REASON FOR ASSESSMENT:   Malnutrition Screening Tool    ASSESSMENT:   79 year old female with PMHx of breast cancer s/p lumpectomy and chemo/XRT, DM, diabetic peripheral neuropathy, GERD, vocal cord paralysis, tracheal stenosis requiring chronic tracheostomy admitted with acute encephalopathy/AMS, left lower lobe PNA/aspiration, dysphagia.   Per review of chart patient was previously on regular-texture diet but was found to be pocketing food. She was made NPO and is pending MBSS tomorrow. Of note, patient had CT angio chest yesterday which found dilated esophagus containing air and fluid. Per report this may represent dysmotility or outlet obstruction.  Met with patient at bedside. She is on tracheostomy collar. She is able to mouth some words. She reports she typically has good appetite and intake. Unable to provide specifics at this time. Patient does endorse weight loss but is unable to provide specifics. Per review of chart patient was 154.8 lbs on 11/24/2018. She is currently documented to be 72 kg (158.73 lbs).  Medications reviewed and include: Diflucan, Protonix, NS at 75 mL/hr, Unasyn, ceftriaxone.  Labs reviewed: CBG 129-156.  Patient is at risk for malnutrition but does not meet criteria for malnutrition at this time.  Discussed with RN.   NUTRITION - FOCUSED PHYSICAL EXAM:    Most Recent Value  Orbital Region No depletion  Upper Arm Region Mild depletion  Thoracic and Lumbar Region No depletion  Buccal Region No depletion  Temple Region No depletion   Clavicle Bone Region Mild depletion  Clavicle and Acromion Bone Region Mild depletion  Scapular Bone Region No depletion  Dorsal Hand Mild depletion  Patellar Region No depletion  Anterior Thigh Region No depletion  Posterior Calf Region No depletion  Edema (RD Assessment) None  Hair Reviewed  Eyes Reviewed  Mouth Reviewed  Skin Reviewed  Nails Reviewed     Diet Order:   Diet Order            Diet NPO time specified  Diet effective now                EDUCATION NEEDS:   No education needs have been identified at this time  Skin:  Skin Assessment: Reviewed RN Assessment  Last BM:  01/17/2020  Height:   Ht Readings from Last 1 Encounters:  01/15/20 5' 4" (1.626 m)   Weight:   Wt Readings from Last 1 Encounters:  01/17/20 72 kg   BMI:  Body mass index is 27.25 kg/m.  Estimated Nutritional Needs:   Kcal:  1800-2000  Protein:  90-100 grams  Fluid:  1.8 L/day   King, MS, RD, LDN Pager number available on Amion 

## 2020-01-18 NOTE — Progress Notes (Signed)
Pharmacy Antibiotic Note  Angela David is a 79 y.o. female admitted on 01/15/2020 with pneumonia.  Pharmacy has been consulted for Unasyn dosing. CrCl = 48.9 ml/min   Plan: Unasyn 3 gm IV Q6H ordered to start on 11/22 @ ~ 300.  Height: 5\' 4"  (162.6 cm) Weight: 72 kg (158 lb 11.7 oz) IBW/kg (Calculated) : 54.7  Temp (24hrs), Avg:98.6 F (37 C), Min:97.9 F (36.6 C), Max:99.6 F (37.6 C)  Recent Labs  Lab 01/15/20 1343 01/16/20 0512 01/17/20 0436  WBC 8.7 7.4 7.8  CREATININE 0.89 0.91 0.95    Estimated Creatinine Clearance: 48.2 mL/min (by C-G formula based on SCr of 0.95 mg/dL).    Allergies  Allergen Reactions  . Shellfish Allergy Shortness Of Breath    respitatory distress   . Sodium Sulfite Shortness Of Breath    Severe Congestion that creates inability to breath  . Sulfa Antibiotics Other (See Comments) and Shortness Of Breath    Other reaction(s): OTHER Other reaction(s): Other (See Comments) respitatory distress  Other reaction(s): OTHER respitatory distress  Other reaction(s): OTHER Other reaction(s): Other (See Comments) respitatory distress  respitatory distress   . Codeine Other (See Comments)    Per patient "it kept her up all night"    Antimicrobials this admission:   >>    >>   Dose adjustments this admission:   Microbiology results:  BCx:   UCx:    Sputum:    MRSA PCR:   Thank you for allowing pharmacy to be a part of this patient's care.  Azarah Dacy D 01/18/2020 2:14 AM

## 2020-01-18 NOTE — Progress Notes (Signed)
OT Cancellation Note  Patient Details Name: Angela David MRN: [id number] DOB: 1940/10/03   Cancelled Treatment:    Reason Eval/Treat Not Completed: Other (comment). Chart reviewed. Pt has transitioned to higher level of care. Per therapy protocols, will require new orders or continue at transfer to continue therapy services. Will follow acutely and initiate services as pt medically appropriate.   Dessie Coma, M.S. OTR/L  01/18/20, 8:04 AM  ascom [phone]

## 2020-01-19 ENCOUNTER — Inpatient Hospital Stay: Payer: Medicare Other

## 2020-01-19 LAB — CULTURE, RESPIRATORY W GRAM STAIN: Culture: NORMAL

## 2020-01-19 LAB — BLOOD GAS, VENOUS
Acid-base deficit: 0.9 mmol/L (ref 0.0–2.0)
Bicarbonate: 23.5 mmol/L (ref 20.0–28.0)
FIO2: 0.28
O2 Saturation: 85.4 %
Patient temperature: 37
pCO2, Ven: 37 mmHg — ABNORMAL LOW (ref 44.0–60.0)
pH, Ven: 7.41 (ref 7.250–7.430)
pO2, Ven: 50 mmHg — ABNORMAL HIGH (ref 32.0–45.0)

## 2020-01-19 LAB — CBC
HCT: 37.4 % (ref 36.0–46.0)
Hemoglobin: 11.9 g/dL — ABNORMAL LOW (ref 12.0–15.0)
MCH: 26.4 pg (ref 26.0–34.0)
MCHC: 31.8 g/dL (ref 30.0–36.0)
MCV: 83.1 fL (ref 80.0–100.0)
Platelets: 183 10*3/uL (ref 150–400)
RBC: 4.5 MIL/uL (ref 3.87–5.11)
RDW: 14 % (ref 11.5–15.5)
WBC: 7.7 10*3/uL (ref 4.0–10.5)
nRBC: 0 % (ref 0.0–0.2)

## 2020-01-19 LAB — BASIC METABOLIC PANEL
Anion gap: 12 (ref 5–15)
BUN: 11 mg/dL (ref 8–23)
CO2: 23 mmol/L (ref 22–32)
Calcium: 8.3 mg/dL — ABNORMAL LOW (ref 8.9–10.3)
Chloride: 106 mmol/L (ref 98–111)
Creatinine, Ser: 0.72 mg/dL (ref 0.44–1.00)
GFR, Estimated: >60 mL/min (ref >60)
Glucose, Bld: 122 mg/dL — ABNORMAL HIGH (ref 70–99)
Potassium: 3.4 mmol/L — ABNORMAL LOW (ref 3.5–5.1)
Sodium: 141 mmol/L (ref 135–145)

## 2020-01-19 LAB — GLUCOSE, CAPILLARY
Glucose-Capillary: 102 mg/dL — ABNORMAL HIGH (ref 70–99)
Glucose-Capillary: 106 mg/dL — ABNORMAL HIGH (ref 70–99)
Glucose-Capillary: 112 mg/dL — ABNORMAL HIGH (ref 70–99)
Glucose-Capillary: 120 mg/dL — ABNORMAL HIGH (ref 70–99)
Glucose-Capillary: 94 mg/dL (ref 70–99)
Glucose-Capillary: 94 mg/dL (ref 70–99)

## 2020-01-19 LAB — MAGNESIUM: Magnesium: 1.9 mg/dL (ref 1.7–2.4)

## 2020-01-19 LAB — PROCALCITONIN: Procalcitonin: 0.1 ng/mL

## 2020-01-19 MED ORDER — MELATONIN 5 MG PO TABS
5.0000 mg | ORAL_TABLET | Freq: Every day | ORAL | Status: DC
  Administered 2020-01-19: 5 mg via ORAL
  Filled 2020-01-19: qty 1

## 2020-01-19 NOTE — Progress Notes (Signed)
Modified Barium Swallow Progress Note  Patient Details  Name: Angela David MRN: [id number] Date of Birth: 1940/12/12  Today's Date: 01/19/2020  Modified Barium Swallow completed.  Full report located under Chart Review in the Imaging Section.  Brief recommendations include the following:  Clinical Impression  Imaging is difficult d/t pt's poor position. Despite having pillows and support to sit upright pt leaned to her right with her head laying to her left. Pt was positioned in an oblique position for best imaging. Pt doesn't occlude her trach when eating. Trials were given with trach un occluded and with SLP finger occluding trach hub. No change is oropharyngeal abilities were observed. Pt has a very complex tracheal and esophageal history. This Modified Barium Swallow was provided to assess for aspiration in an effort to return pt to an oral diet. Per attending report, pt's otolaryngologist doesn't have any further recommendations for trach management and her current ENT requested her trach to be left "as it is." Pt's HR continues to be elevated at ~ 100 but MD granted permission to have swallow study and pt's nurse was present during the study for close monitoring. Pt on 5 L trach collar. Aspiration was not observed during the study but again clear visualization was difficult to achieve d/t pt positioning. Pt is not able to perform any compensatory swallow strategies.     Pt presents with severe sensori-motor oropharyngeal dysphagia. Pt's oral phase is c/b decreased lingual manipulation of puree, mechanical soft, thin liquids and nectar thick liquids. Pt utilizes lingual pumping to help with posterior propulsion of boluses. Pt has premature spillage of all boluses into her pyriform sinuses and piecemeal swallowing. Her pharyngeal phase is characterized by extremely delayed swallow initiation, decreased pharyngeal squeeze and limited amounts of bolus transiting through the UES with each  swallow. As a result, > 90% of each bolus residues in the pharynx that is only cleared thru the UES after 5 to 6 swallows. At baseline, pt consumes her medicine whole in puree. During this study pt cleared the puree thru her UES but the barium tablet resides in the cervical esophagus and required liquid wash and multiple swallows to clear. When completing multiple swallows per bolus, pt's HR climbed to 116 and to 118. Pt is at an extremely high risk of aspirating all PO intake as well as having a cardiac response to multiple swallows. A conservative diet would be dysphagia 1 with thin liquids via cup, medicine crushed in puree. Also recommend a consult for Palliative Care to help with GOC.      Swallow Evaluation Recommendations   Recommended Consults:  (Palliative Care)   SLP Diet Recommendations: Dysphagia 1 (Puree) solids;Thin liquid   Liquid Administration via: Cup;No straw   Medication Administration: Whole meds with puree   Supervision: Staff to assist with self feeding;Full supervision/cueing for compensatory strategies   Compensations: Minimize environmental distractions;Slow rate;Small sips/bites   Postural Changes: Seated upright at 90 degrees   Oral Care Recommendations: Oral care BID       [name] B. Rutherford Nail M.S., CCC-SLP, [place] Office [phone]  [name] Rutherford Nail 01/19/2020,7:57 PM

## 2020-01-19 NOTE — Progress Notes (Signed)
  Chaplain On-Call responded to page from [place], [employer], who reported patient and husband requested to see a Chaplain.  Chaplain met patient with her husband, Angela David, at bedside. Patient spoke highly of two Chaplain Residents, Angela David and Angela David, whom she remembered from previous hospitalizations.  Patient asked for prayer for her health. Chaplain provided prayer and spiritual and emotional support.  Huson Angela David M.Div., [facility]

## 2020-01-19 NOTE — Progress Notes (Signed)
Speech Language Pathology Treatment: Dysphagia  Patient Details Name: Angela David MRN: [id number] DOB: 07-23-1940 Today's Date: 01/19/2020 Time: 0720-0745 SLP Time Calculation (min) (ACUTE ONLY): 25 min  Assessment / Plan / Recommendation Clinical Impression  Pt seen for ongoing dysphagia and for evaluation if she is able to maintain alertness for a possible instrumental study today. Pt was awake, alert and when prompted she finger occluded trach hub to produce phonation. Her voice appeared very strained and appeared to take a lot of effort to produce - possibly suggesting upper airway obstruction. She communicated in short 2 - 3 word phrases d/t decreased respiratory drive. Trials of ice chips were administered. Pt's oral phase was absent of any movement to manage ice chips and her pharyngeal phase response appeared delayed and she produced multiple swallows (3-4) per teaspoon of ice. Pt stated that she does finger occlude when swallowing but she had moderate difficulty coordinating occlusion with swallow d/t difficulty locating trach hub and having spoon in her hand. Her swallow presentation didn't change with occlusion and without finger occlusion. Pt indicated that it takes a great deal of effort to swallow but it is not painful to swallow. While these characteristics place her at an extremely high risk of aspiration, there are even more concerning medical concerns. When briefly talking and during 3 trials of ice chips, pt's HR steadily increased to 104 and her RR increased from 17 to 26. She looked slightly distressed and nursing was made aware. Her RR recovered to 16 but her HR remained elevated. As such, she is not a candidate for an instrumental study today. Continue to recommend NPO.   At this time, I am recommending pt transfer to a higher level of care at [facility] where she can resume care under her Otolaryngologist (Dr Unice Bailey) and her Speech-Language Pathologist Sadie Haber)  who specialize in trach management. 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   At this time, pt is not able to tolerate an inner cannula as it slightly reduces her airway. Lack of inner cannula prevents placement of a [place] Speaking Valve. As such she uses her finger to occlude trach hub for phonation and swallowing. This introduces continued bacteria into her respiratory system without any filter. During this session, pt's finger had polish that was dirty and chipping and her trach hub was dirty (not related to secretions). SHE REQUIRES HIGHER LEVEL OF CARE for MANAGEMENT OF SEVERE TRACHEAL STENOSIS, CONTINUED MANAGEMENT OF TRACH BY HER CURRENT TEAM AT [facility] WHERE THEY CAN ASSESS TRACH SIZE, TRACH CUSTOMIZATION AND PROVIDING A CLOSED AIRWAY WITHOUT HAVING TO FINGER OCCLUDE.   Pt also has recent history of granular tissue resection with tracheal and esophageal dilation by Dr Beverely Risen at [facility]. Her current chest x-ray documented possible indication of esophageal obstruction. [facility] will also give her access to an OR should surgical intervention be necessary.      HPI HPI: Angela David is a 79 y.o. female with medical history significant for hypertension, diabetes, history of trachea stenosis status post tracheostomy who presented to the ED on 01/15/20 with mental status changes with disorientation, not recognizing family, generalized weakness for several days.  Pt has been chronically trach dependent since 2013 and is followed by [facility] Otolaryngology and Anda Latina (ENT, [facility]). Pertinant information: May 31, 2019: MBSS at [facility] - "Pt presents with a mild oral and  mild to moderate pharyngeal dysphagia c/b reduced oral manipulation, delayed swallow initiation, reduced BOT retraction and reduced pharyngeal constriction. These led to increased oral transit  time, vallecula residue (mild - thin liquids, moderate - solids), pyriform sinus residue (mild - thin liquids, moderate - solids and trace penetration of solids. Penetration cleared with swallows. Residue significantly reduced with liquid rinse and subsequent swallows. Cervical esophagus: accumulation of solids within the UES/cervical esophagus essentially cleared with liquid rinse and subsequent swallows." June 24, 2019: The patient underwent MDL with excision of suprastomal granulation tissue and tracheal balloon dilation, and esophagoscopy with UES balloon dilation. Currently pt has a fenestrated #6 Shiley with inability to tolerate inner cannula and PMSV but per her husband she can achieve phonation with finger occulsion. Per husband's report, she has difficulty swallowing with it varying between moderate to severe.       SLP Plan   (request transfer to [facility] for higher level of care for trach)       Recommendations  Diet recommendations: NPO Medication Administration: Via alternative means                General recommendations:  (transfer to [facility] for higher level of care for trach dependenc) Oral Care Recommendations: Oral care QID Follow up Recommendations:  (transfer to [facility] ) SLP Visit Diagnosis: Dysphagia, oropharyngeal phase (R13.12) Plan:  (request transfer to [facility] for higher level of care for trach)       GO               Angela David B. Rutherford Nail M.S., CCC-SLP, [place] Office [phone]  Stormy Fabian 01/19/2020, 9:53 AM

## 2020-01-19 NOTE — Progress Notes (Signed)
PROGRESS NOTE    Caleah Tortorelli   EHU:[id number]  DOB: 30-Dec-1940  PCP: Iva Boop, MD    DOA: 01/15/2020 LOS: 3   Brief Narrative   Damica Gravlin is a 79 y.o. female with medical history significant for hypertension, diabetes, history of trachea stenosis status post tracheostomy who presented to the ED on 01/15/20 with mental status changes with disorientation, not recognizing family, generalized weakness for several days.    Evaluation in the ED was mostly unrevealing except for possible right lower lobe pneumonia.  Patient improving with IV antibiotics.  However, patient is without respiratory symptoms, trach secretions or leukocytosis.  Procalcitonin normal. She does, however, report history of what sounds like aspiration at times.  History of prior admission in January this year for AMS and found hypercapneic.  Pt says her confusion and weakness this time resembles how she felt back then.  VBG obtained in the ED was normal.  Ammonia level normal.  UA negative.  CT head negative.   11/20 - admit to inpatient given ongoing hallucinations today.       Assessment & Plan   Principal Problem:   Acute encephalopathy Active Problems:   Left lower lobe pneumonia   Gait abnormality   Anxiety state   HLD (hyperlipidemia)   Tracheostomy present (HCC)   Type 2 diabetes mellitus (HCC)   Peripheral neuropathy    Acute encephalopathy / AMS - likely due to delirium with PNA, possibly underlying mild vascular dementia given MRI findings.  VBG normal without hypercapnea.  Normal ammonia level. TSH normal.  CT head negative for anything acute.  UA negative.  Chest xray with LLL opacity and pleural effusion.  Vitamin D normal and B12 elevated.  CVA in the differential but neuro exam is nonfocal, MRI was negative for CVA.  No hx of seizure. Chart review showed ED visit on 11/11 after a fall, head CT negative for acute findings then as well.   MRI brain 11/20 showed  only advanced chronic microvascular disease and atrophy, no stroke, mass or other findings to explain her confusion and hallucinations. Suspect patient has vascular or some form of dementia, now with superimposed delirium with infection. --Neuro checks --Treat for PNA as below --Okay to transfer to 2A if stable vitals, delirium improved and patient able to press call button, and produce strong cough if having secretions.  Left lower lobe pneumonia / Aspiration / Left pleural effusion - POA by imaing.  Possibly causing her confusion and hallucinations.  Pt was not having respiratory symptoms, no leukocytosis and normal procal.  She is aspirating. 11/21 - purulent appearing trach secretions, episodes of distress, transferred to stepdown closer monitoring 11/22-23 - improved mentation, improving secretions --Continue Unasyn --SLP consulted --Modified barium swallow study today - see report.  Has severe dysphagia currently --Monitor O2 sat and provide supplemental O2 if needed to keep sats > 88% --follow cultures  Aspiration / Dysphagia / Esophageal Dysmotility - NPO.  Hold PO meds.  Maintenance IV fluids.  SLP consulted.  See MBSS report 11/23.  Palliative care consulted for [place].  I brought up topic of feeding tube if PO intake remains unsafe.  Pt says she's put 10 lbs back on after losing 30.  Family and patient report she has periods when she can't swallow anything, other times does fine for long time.  Generalized weakness / Gait abnormality - possibly due to infection.  PT [name] evaluations.  Recent Fall / Right distal clavicular fracture - no  complaint of pain from patient, discovered this on chart review when seen in ED 11/11 after a fall.  She was placed in sling and to follow up with ortho in 1 week, but ended up admitted.   Pt told ED physician she doesn't tolerate pain meds, okay just with Tylenol. --Monitor  Elevated D Dimer - POA, patient has no tachycardia or hypoxia, doubt PE.   Lower extremity dopplers were negative.  Suspect elevated due to PNA.  Anxiety / Depression - continue home Buspar, Lexapro  Hyperlipidemia - continue statin  Tracheostomy present - no acute issues.  Monitor.  Trach collar O2 as needed.  [facility] transfer was requested for continuity of care but no beds.  I spoke with patient's ENT Dr. Early Osmond at [facility] at request of SLP due to her complex trach history and current state.  No additional recommendations to add.  Follow up outpatient.  Agreed with current plan.    Type 2 diabetes mellitus - hold metformin.  Sliding scale Novolog.  Last A1c in Jan 2021 was 6.6%.  Peripheral neuropathy - hold gabapentin due to AMS, continue Requip for ?RLS  GERD - continue PPI   DVT prophylaxis: enoxaparin (LOVENOX) injection 40 mg Start: 01/15/20 2245 Place TED hose Start: 01/15/20 2237   Diet:  Diet Orders (From admission, onward)    Start     Ordered   01/19/20 1655  DIET - DYS 1 Room service appropriate? Yes; Fluid consistency: Thin  Diet effective now       Comments: NO STRAWS  Question Answer Comment  Room service appropriate? Yes   Fluid consistency: Thin      01/19/20 1656            Code Status: Full Code    Subjective 01/19/20    Pt seen with husband at bedside today in stepdown unit.  Patient is awake and alert, in good spirits visiting husband.  States feeling better today.  We discussed swallowing troubles, potential future need to address nutrition if not getting enough calories.  She does say she had lost 30 lbs but has put 10 lbs back on.  Husband talks of patient's intermittent difficulty with swallow.  Usually does well, but can have periods of inability to swallow anything.  Much less secretions today, RN said suctioned once earlier today.    Disposition Plan & Communication   Status is: Inpatient  Remains inpatient appropriate because:IV treatments appropriate due to intensity of illness or inability to take PO.  Unsafe  swallowing, NPO.  Remains on IV antibiotics.  May require SNF placement.     Dispo: The patient is from: Home              Anticipated d/c is to: Home              Anticipated d/c date is: 2 days              Patient currently is not medically stable to d/c.    Family Communication: Husband was at bedside on rounds today.  Consults, Procedures, Significant Events   Consultants:   None  Procedures:   None  Antimicrobials:  Anti-infectives (From admission, onward)   Start     Dose/Rate Route Frequency Ordered Stop   01/18/20 0900  fluconazole (DIFLUCAN) tablet 150 mg        150 mg Oral Once per day on Mon Thu 01/15/20 2239     01/18/20 0300  Ampicillin-Sulbactam (UNASYN) 3 g in sodium chloride  0.9 % 100 mL IVPB        3 g 200 mL/hr over 30 Minutes Intravenous Every 6 hours 01/18/20 0212     01/16/20 0345  azithromycin (ZITHROMAX) 500 mg in sodium chloride 0.9 % 250 mL IVPB  Status:  Discontinued        500 mg 250 mL/hr over 60 Minutes Intravenous Every 24 hours 01/16/20 0252 01/18/20 0200   01/16/20 0345  cefTRIAXone (ROCEPHIN) 1 g in sodium chloride 0.9 % 100 mL IVPB  Status:  Discontinued        1 g 200 mL/hr over 30 Minutes Intravenous Every 24 hours 01/16/20 0252 01/18/20 1342         Objective   Vitals:   01/19/20 1800 01/19/20 1900 01/19/20 2000 01/19/20 2025  BP:   (!) 151/56   Pulse: 98 (!) 101 98   Resp: (!) 23 (!) 22 (!) 24   Temp:   98 F (36.7 C)   TempSrc:   Oral   SpO2: 98% 100% 99% 100%  Weight:      Height:        Intake/Output Summary (Last 24 hours) at 01/19/2020 2122 Last data filed at 01/19/2020 1800 Gross per 24 hour  Intake 1732.82 ml  Output 950 ml  Net 782.82 ml   Filed Weights   01/15/20 1341 01/17/20 1700  Weight: 70.3 kg 72 kg    Physical Exam:  General exam: awake and alert, talkative, no acute distress HEENT: trach in place with no visible secretions, caps trach with finger for speech, moist mucus membranes, hearing  grossly normal Respiratory system: trach collar on 5 L/min, no visible trach secretions.  Lungs are clear bilaterally without wheezes or rhonchi, diminished left base, normal respiratory effort. Cardiovascular system: normal S1/S2, RRR, no pedal edema.   Central nervous system: no gross focal neurologic deficits, normal speech, A&Ox3. Extremities: no cyanosis, no edema, moves all    Labs   Data Reviewed: I have personally reviewed following labs and imaging studies  CBC: Recent Labs  Lab 01/15/20 1343 01/16/20 0512 01/17/20 0436 01/19/20 0409  WBC 8.7 7.4 7.8 7.7  NEUTROABS  --   --  5.9  --   HGB 12.6 11.3* 12.8 11.9*  HCT 40.7 35.6* 39.7 37.4  MCV 84.6 81.7 79.9* 83.1  PLT 206 201 218 947   Basic Metabolic Panel: Recent Labs  Lab 01/15/20 1343 01/16/20 0512 01/17/20 0436 01/18/20 0254 01/19/20 0409  NA 138 136 137 137 141  K 4.4 3.9 3.7 3.5 3.4*  CL 104 102 105 102 106  CO2 23 26 24 23 23   GLUCOSE 183* 138* 182* 163* 122*  BUN 20 17 18 15 11   CREATININE 0.89 0.91 0.95 0.77 0.72  CALCIUM 9.4 9.0 10.2 9.0 8.3*  MG  --   --  1.7  --  1.9   GFR: Estimated Creatinine Clearance: 57.3 mL/min (by C-G formula based on SCr of 0.72 mg/dL). Liver Function Tests: Recent Labs  Lab 01/15/20 1343  AST 26  ALT 21  ALKPHOS 62  BILITOT 0.6  PROT 7.5  ALBUMIN 3.8   No results for input(s): LIPASE, AMYLASE in the last 168 hours. Recent Labs  Lab 01/16/20 0512  AMMONIA 21   Coagulation Profile: No results for input(s): INR, PROTIME in the last 168 hours. Cardiac Enzymes: No results for input(s): CKTOTAL, CKMB, CKMBINDEX, TROPONINI in the last 168 hours. BNP (last 3 results) No results for input(s): PROBNP in the last 8760  hours. HbA1C: No results for input(s): HGBA1C in the last 72 hours. CBG: Recent Labs  Lab 01/19/20 0405 01/19/20 0743 01/19/20 1136 01/19/20 1654 01/19/20 1913  GLUCAP 120* 112* 102* 106* 94   Lipid Profile: No results for input(s): CHOL,  HDL, LDLCALC, TRIG, CHOLHDL, LDLDIRECT in the last 72 hours. Thyroid Function Tests: No results for input(s): TSH, T4TOTAL, FREET4, T3FREE, THYROIDAB in the last 72 hours. Anemia Panel: No results for input(s): VITAMINB12, FOLATE, FERRITIN, TIBC, IRON, RETICCTPCT in the last 72 hours. Sepsis Labs: Recent Labs  Lab 01/15/20 1343 01/18/20 0254 01/19/20 0409  PROCALCITON <0.10 0.12 0.10    Recent Results (from the past 240 hour(s))  Resp Panel by RT-PCR (Flu A&B, Covid) Nasopharyngeal Swab     Status: None   Collection Time: 01/15/20  5:11 PM   Specimen: Nasopharyngeal Swab; Nasopharyngeal(NP) swabs in vial transport medium  Result Value Ref Range Status   SARS Coronavirus 2 by RT PCR NEGATIVE NEGATIVE Final    Comment: (NOTE) SARS-CoV-2 target nucleic acids are NOT DETECTED.  The SARS-CoV-2 RNA is generally detectable in upper respiratory specimens during the acute phase of infection. The lowest concentration of SARS-CoV-2 viral copies this assay can detect is 138 copies/mL. A negative result does not preclude SARS-Cov-2 infection and should not be used as the sole basis for treatment or other patient management decisions. A negative result may occur with  improper specimen collection/handling, submission of specimen other than nasopharyngeal swab, presence of viral mutation(s) within the areas targeted by this assay, and inadequate number of viral copies(<138 copies/mL). A negative result must be combined with clinical observations, patient history, and epidemiological information. The expected result is Negative.  Fact Sheet for Patients:  EntrepreneurPulse.com.au  Fact Sheet for Healthcare Providers:  IncredibleEmployment.be  This test is no t yet approved or cleared by the [place] FDA and  has been authorized for detection and/or diagnosis of SARS-CoV-2 by FDA under an Emergency Use Authorization (EUA). This EUA will remain  in  effect (meaning this test can be used) for the duration of the COVID-19 declaration under Section 564(b)(1) of the Act, 21 U.S.C.section 360bbb-3(b)(1), unless the authorization is terminated  or revoked sooner.       Influenza A by PCR NEGATIVE NEGATIVE Final   Influenza B by PCR NEGATIVE NEGATIVE Final    Comment: (NOTE) The Xpert Xpress SARS-CoV-2/FLU/RSV plus assay is intended as an aid in the diagnosis of influenza from Nasopharyngeal swab specimens and should not be used as a sole basis for treatment. Nasal washings and aspirates are unacceptable for Xpert Xpress SARS-CoV-2/FLU/RSV testing.  Fact Sheet for Patients: EntrepreneurPulse.com.au  Fact Sheet for Healthcare Providers: IncredibleEmployment.be  This test is not yet approved or cleared by the [place] FDA and has been authorized for detection and/or diagnosis of SARS-CoV-2 by FDA under an Emergency Use Authorization (EUA). This EUA will remain in effect (meaning this test can be used) for the duration of the COVID-19 declaration under Section 564(b)(1) of the Act, 21 U.S.C. section 360bbb-3(b)(1), unless the authorization is terminated or revoked.  Performed at [facility], [address] [place], [place] [address]   Culture, blood (single) w Reflex to ID Panel     Status: None (Preliminary result)   Collection Time: 01/17/20 12:48 PM   Specimen: BLOOD  Result Value Ref Range Status   Specimen Description BLOOD BLLA  Final   Special Requests   Final    BOTTLES DRAWN AEROBIC AND ANAEROBIC Blood Culture adequate  volume   Culture   Final    NO GROWTH 2 DAYS Performed at [facility], [address] [place], [place] [address]    Report Status PENDING  Incomplete  Expectorated sputum assessment w rflx to resp cult     Status: None   Collection Time: 01/17/20  3:26 PM   Specimen: Sputum  Result Value Ref Range Status   Specimen Description   Final     SPUTUM Performed at [facility], [address], [place], [place] [address]    Special Requests   Final    NONE Performed at [facility], [address] [place], [place] [address]    Sputum evaluation   Final    THIS SPECIMEN IS ACCEPTABLE FOR SPUTUM CULTURE Performed at [facility], [place] [address] [place], [place] [address]    Report Status 01/17/2020 FINAL  Final  Culture, respiratory     Status: None   Collection Time: 01/17/20  3:26 PM   Specimen: SPU  Result Value Ref Range Status   Specimen Description SPUTUM  Final   Special Requests NONE Reflexed from [id number]  Final   Gram Stain   Final    MODERATE WBC PRESENT, PREDOMINANTLY PMN FEW GRAM POSITIVE RODS FEW GRAM NEGATIVE RODS FEW GRAM POSITIVE COCCI IN PAIRS IN CLUSTERS    Culture   Final    Normal respiratory flora-no Staph aureus or Pseudomonas seen Performed at [facility], [address] [place], [place] [address]    Report Status 01/19/2020 FINAL  Final  MRSA PCR Screening     Status: None   Collection Time: 01/17/20  5:00 PM   Specimen: Nasopharyngeal  Result Value Ref Range Status   MRSA by PCR NEGATIVE NEGATIVE Final    Comment:        The GeneXpert MRSA Assay (FDA approved for NASAL specimens only), is one component of a comprehensive MRSA colonization surveillance program. It is not intended to diagnose MRSA infection nor to guide or monitor treatment for MRSA infections. Performed at [facility], [address] [place],  [address]       Imaging Studies   CT ANGIO CHEST PE W OR WO CONTRAST  Result Date: 01/17/2020 CLINICAL DATA:  Suspected pulmonary embolus with low to intermediate probability. Positive D-dimer. Altered mental status. EXAM: CT ANGIOGRAPHY CHEST WITH CONTRAST TECHNIQUE: Multidetector CT imaging of the chest was performed using the standard protocol during bolus administration of intravenous contrast. Multiplanar CT image  reconstructions and MIPs were obtained to evaluate the vascular anatomy. CONTRAST:  55mL OMNIPAQUE IOHEXOL 350 MG/ML SOLN COMPARISON:  03/26/2019 FINDINGS: Cardiovascular: There is good opacification of the central and segmental pulmonary arteries. No focal filling defects. No evidence of significant pulmonary embolus. Cardiac enlargement. No pericardial effusions. Coronary artery and aortic calcifications. Normal caliber thoracic aorta. No aortic dissection. Great vessel origins are patent. Mediastinum/Nodes: The esophagus is diffusely dilated and contains air and fluid. This may represent dysmotility or possibly outlet obstruction. Similar finding on previous study suggest chronic process. No significant lymphadenopathy. A tracheostomy tube is present. Lungs/Pleura: Fibrosis and scarring in the lung apices, greater on the right. Mild peripheral interstitial pattern likely also represents fibrosis. There are multiple subcentimeter nodules demonstrated throughout the right lung. These were also present on the previous study. Focal consolidation or atelectasis in the left lung base. Small patchy infiltrative areas demonstrated in the left lingula. Changes may represent inflammatory process or aspiration. No pleural effusions.  No pneumothorax. Upper Abdomen: Surgical absence of the gallbladder. No acute process demonstrated in the visualized upper abdomen. Musculoskeletal: Degenerative changes in the spine. Old deformity of the sternum, likely posttraumatic. No destructive bone lesions or acute changes identified. Review of the MIP images confirms the above findings. IMPRESSION: 1. No evidence of significant pulmonary embolus. 2. Focal consolidation or atelectasis in the left lung base. Small patchy infiltrative areas demonstrated in the left lingula. Changes may represent inflammatory process or aspiration. 3. Fibrosis and scarring in the lung apices, greater on the right. 4. Multiple subcentimeter nodules throughout  the right lung. These were also present on the previous study. 5. Cardiac enlargement. 6. Dilated esophagus containing air and fluid. This may represent dysmotility or possibly outlet obstruction. Similar finding on previous study. 7. Tracheostomy tube. 8. Aortic atherosclerosis. Aortic Atherosclerosis (ICD10-I70.0). Electronically Signed   By: Lucienne Capers M.D.   On: 01/17/2020 23:42   DG Chest Port 1 View  Result Date: 01/18/2020 CLINICAL DATA:  Cough. EXAM: PORTABLE CHEST 1 VIEW COMPARISON:  Chest radiographs 01/15/2020 and CTA 01/17/2020 FINDINGS: The patient is rotated to the left. Tracheostomy tube overlies the airway. The cardiomediastinal silhouette is unchanged with normal heart size. There is persistent elevation of the right hemidiaphragm. Patchy left basilar airspace opacity has decreased. No sizable pleural effusion or pneumothorax is identified. Old rib fractures are noted. IMPRESSION: Improved left basilar aeration. Electronically Signed   By: Logan Bores M.D.   On: 01/18/2020 08:11   DG Swallowing Func-Speech Pathology  Result Date: 01/19/2020 Objective Swallowing Evaluation: Type of Study: MBS-Modified Barium Swallow Study  Patient Details Name: Kya Mayfield MRN: [id number] Date of Birth: 12-16-1940 Today's Date: 01/19/2020 Time: SLP Start Time (ACUTE ONLY): 1420 -SLP Stop Time (ACUTE ONLY): 1445 SLP Time Calculation (min) (ACUTE ONLY): 25 min Past Medical History: Past Medical History: Diagnosis Date . Breast cancer ([place]) 2002  right breast chemo and radiation . Cancer [facility]) 2002  right breast . Diabetes mellitus without complication ([place])  . Diabetic peripheral neuropathy ([place]) 08/28/2016 . GERD (gastroesophageal reflux disease)  . Glaucoma  . High cholesterol  . History of colon polyps  . Peripheral neuropathy 08/28/2016 . Personal history of chemotherapy 2002  BREAST CA . Personal history of malignant neoplasm of breast  . Personal history of radiation therapy 2002  BREAST CA .  Vocal cord paralysis 2012 Past Surgical History: Past Surgical History: Procedure Laterality Date . ABDOMINAL HYSTERECTOMY   . BREAST BIOPSY Right 2008  neg . BREAST EXCISIONAL BIOPSY Right 2002  positive . BREAST LUMPECTOMY Right 2002  chemo and radiation . BREAST SURGERY Right 2002  LUMPECTOMY, radiation, chemotherapy . CHOLECYSTECTOMY N/A 12/10/2017  Procedure: LAPAROSCOPIC CHOLECYSTECTOMY;  Surgeon: Benjamine Sprague, DO;  Location: [facility] ORS;  Service: General;  Laterality: N/A; . COLON RESECTION Right 11/06/2018  Procedure: HAND ASSISTED LAPAROSCOPIC COLON RESECTION, RIGHT;  Surgeon: Jules Husbands, MD;  Location: [facility] ORS;  Service: General;  Laterality: Right; . COLONOSCOPY  2009,12/30/2013 . COLONOSCOPY WITH PROPOFOL N/A 04/16/2018  Tubulovillous polyp proximal transverse colon, large sessile polyp, proximal transverse colon;  Surgeon: Robert Bellow, MD;  Location: [facility] ENDOSCOPY;  Service: Endoscopy;  Laterality: N/A; . ESOPHAGOGASTRODUODENOSCOPY (EGD) WITH PROPOFOL N/A 10/14/2018  Procedure: ESOPHAGOGASTRODUODENOSCOPY (EGD) WITH PROPOFOL;  Surgeon: Lucilla Lame, MD;  Location: [facility] ENDOSCOPY;  Service: Endoscopy;  Laterality: N/A; . HYDROCELE EXCISION / REPAIR   . TRACHEAL SURGERY  2013 . TRACHEOSTOMY N/A   Dr Kathyrn Sheriff HPI: Wafa Martes is a 79 y.o. female  with medical history significant for hypertension, diabetes, history of trachea stenosis status post tracheostomy who presented to the ED on 01/15/20 with mental status changes with disorientation, not recognizing family, generalized weakness for several days.  Pt has been chronically trach dependent since 2013 and is followed by [facility] and Anda Latina (ENT, [facility]). Pertinant information: May 31, 2019: MBSS at [facility] - "Pt presents with a mild oral and mild to moderate pharyngeal dysphagia c/b reduced oral manipulation, delayed swallow initiation, reduced BOT retraction and reduced pharyngeal constriction. These led to increased oral  transit time, vallecula residue (mild - thin liquids, moderate - solids), pyriform sinus residue (mild - thin liquids, moderate - solids and trace penetration of solids. Penetration cleared with swallows. Residue significantly reduced with liquid rinse and subsequent swallows. Cervical esophagus: accumulation of solids within the UES/cervical esophagus essentially cleared with liquid rinse and subsequent swallows." June 24, 2019: The patient underwent MDL with excision of suprastomal granulation tissue and tracheal balloon dilation, and esophagoscopy with UES balloon dilation. Currently pt has a fenestrated #6 [place] with inability to tolerate inner cannula and PMSV but per her husband she can achieve phonation with finger occulsion. Per husband's report, she has difficulty swallowing with it varying between moderate to severe.  Subjective: pt pleasant, alert, attentive to study Assessment / Plan / Recommendation CHL IP CLINICAL IMPRESSIONS 01/19/2020 Clinical Impression Imaging is difficult d/t pt's poor position. Despite having pillows and support to sit upright pt leaned to her right with her head laying to her left. Pt was positioned in an oblique position for best imagaing. Pt doesn't occlud her trach when eating. Trials were given with trach unoccluded and with SLP finger occluding trach hub. No change is oropharyngeal abilities were observed. Pt has a very complex tracheal and esophageal history. This Modified Barium Swallow was provided to assess for aspiration in an effort to return pt to an oral diet. Per attending report, pt's otolaryngologist doesn't have any further recommendations for trach management and her current ENT requested her trach to be left "as it is." Pt's HR continues to be elevated at ~ 100 but MD granted permission to have swallow study and pt's nurse was present during the study for close monitoring. Pt on 5L trach collar. Aspiration was not observed during the study but again clear  visualization was difficult to achieve d/t pt positioning. Pt is not able to perform any compensatory swallow strategies.  Pt presents with severe sensori-motor oropharyngeal dysphagia. Pt's oral phase is c/b decreased lingual manipulation of puree, mechanical soft, thin liquids and nectar thick liquids. Pt utilizes lingual pumping to help with posterior propulsion of boluses. Pt has premature spillage of all boluses into her pyriform sinuses and piecemeal swallowing. Her pharyngeal phase is characterized by extremely delayed swallow initiation, decreased pharyneal squeeze and limited amounts of bolus transiting through the UES with each swallow. As a result, > 90% of each bolus residues in the pharynx that is only cleared thru the UES after 5 to 6 swallows. At baseline, pt consumes her medicine whole in puree. During this study pt cleared the puree thru her UES but the barium tablet resides in the cervical esophagus and required liquid wash and multiple swallows to clear. When completing multiple swallows per bolus, pt's HR climbed to 116 and to 118. Pt is at an extremely high risk of aspirating all PO intake as well as having a cardiac response to multiple swallows. A conservative diet would be dysphagia 1 with thin liquids  via cup, medicine crushed in puree. Also recommend a consult for Palliative Care to help with GOC.  SLP Visit Diagnosis Dysphagia, oropharyngeal phase (R13.12);Dysphagia, pharyngoesophageal phase (R13.14) Attention and concentration deficit following -- Frontal lobe and executive function deficit following -- Impact on safety and function Severe aspiration risk;Risk for inadequate nutrition/hydration   [facility] IP TREATMENT RECOMMENDATION 01/19/2020 Treatment Recommendations No treatment recommended at this time   Prognosis 01/19/2020 Prognosis for Safe Diet Advancement Guarded Barriers to Reach Goals Severity of deficits;Time post onset Barriers/Prognosis Comment -- [facility] IP DIET RECOMMENDATION  01/19/2020 SLP Diet Recommendations Dysphagia 1 (Puree) solids;Thin liquid Liquid Administration via Cup;No straw Medication Administration Whole meds with puree Compensations Minimize environmental distractions;Slow rate;Small sips/bites Postural Changes Seated upright at 90 degrees   [facility] IP OTHER RECOMMENDATIONS 01/19/2020 Recommended Consults (No Data) Oral Care Recommendations Oral care BID Other Recommendations --   [facility] IP FOLLOW UP RECOMMENDATIONS 01/19/2020 Follow up Recommendations (No Data)   [facility] IP FREQUENCY AND DURATION 01/18/2020 Speech Therapy Frequency (ACUTE ONLY) min 2x/week Treatment Duration 2 weeks      [facility] IP ORAL PHASE 01/19/2020 Oral Phase Impaired Oral - Pudding Teaspoon -- Oral - Pudding Cup -- Oral - Honey Teaspoon -- Oral - Honey Cup -- Oral - Nectar Teaspoon Weak lingual manipulation;Lingual pumping;Reduced posterior propulsion;Holding of bolus;Lingual/palatal residue;Piecemeal swallowing;Delayed oral transit;Decreased bolus cohesion;Premature spillage Oral - Nectar Cup Weak lingual manipulation;Lingual pumping;Reduced posterior propulsion;Holding of bolus;Lingual/palatal residue;Piecemeal swallowing;Delayed oral transit;Decreased bolus cohesion;Premature spillage Oral - Nectar Straw -- Oral - Thin Teaspoon Weak lingual manipulation;Lingual pumping;Reduced posterior propulsion;Holding of bolus;Lingual/palatal residue;Piecemeal swallowing;Delayed oral transit;Decreased bolus cohesion;Premature spillage Oral - Thin Cup Weak lingual manipulation;Lingual pumping;Reduced posterior propulsion;Holding of bolus;Lingual/palatal residue;Piecemeal swallowing;Delayed oral transit;Decreased bolus cohesion;Premature spillage Oral - Thin Straw -- Oral - Puree Weak lingual manipulation;Lingual pumping;Reduced posterior propulsion;Holding of bolus;Lingual/palatal residue;Piecemeal swallowing;Delayed oral transit;Decreased bolus cohesion;Premature spillage Oral - Mech Soft Impaired mastication;Weak lingual  manipulation;Lingual pumping;Reduced posterior propulsion;Holding of bolus;Piecemeal swallowing;Delayed oral transit;Decreased bolus cohesion;Lingual/palatal residue Oral - Regular -- Oral - Multi-Consistency -- Oral - Pill Weak lingual manipulation;Lingual pumping;Reduced posterior propulsion;Holding of bolus;Lingual/palatal residue;Piecemeal swallowing;Delayed oral transit;Decreased bolus cohesion;Premature spillage Oral Phase - Comment --  [facility] IP PHARYNGEAL PHASE 01/19/2020 Pharyngeal Phase Impaired Pharyngeal- Pudding Teaspoon -- Pharyngeal -- Pharyngeal- Pudding Cup -- Pharyngeal -- Pharyngeal- Honey Teaspoon -- Pharyngeal -- Pharyngeal- Honey Cup -- Pharyngeal -- Pharyngeal- Nectar Teaspoon Delayed swallow initiation-pyriform sinuses;Reduced pharyngeal peristalsis;Reduced epiglottic inversion;Reduced anterior laryngeal mobility;Reduced laryngeal elevation;Reduced tongue base retraction;Pharyngeal residue - valleculae;Pharyngeal residue - pyriform;Pharyngeal residue - posterior pharnyx;Pharyngeal residue - cp segment;Lateral channel residue;Inter-arytenoid space residue Pharyngeal -- Pharyngeal- Nectar Cup Delayed swallow initiation-pyriform sinuses;Reduced pharyngeal peristalsis;Reduced epiglottic inversion;Reduced anterior laryngeal mobility;Reduced laryngeal elevation;Reduced tongue base retraction;Pharyngeal residue - pyriform;Pharyngeal residue - posterior pharnyx;Pharyngeal residue - cp segment;Pharyngeal residue - valleculae;Inter-arytenoid space residue;Lateral channel residue Pharyngeal -- Pharyngeal- Nectar Straw -- Pharyngeal -- Pharyngeal- Thin Teaspoon Delayed swallow initiation-pyriform sinuses;Reduced pharyngeal peristalsis;Reduced epiglottic inversion;Reduced anterior laryngeal mobility;Reduced laryngeal elevation;Reduced tongue base retraction;Pharyngeal residue - valleculae;Pharyngeal residue - pyriform;Pharyngeal residue - posterior pharnyx;Pharyngeal residue - cp segment;Inter-arytenoid  space residue;Lateral channel residue Pharyngeal -- Pharyngeal- Thin Cup Delayed swallow initiation-pyriform sinuses;Reduced pharyngeal peristalsis;Reduced epiglottic inversion;Reduced anterior laryngeal mobility;Reduced laryngeal elevation;Reduced tongue base retraction;Pharyngeal residue - valleculae;Pharyngeal residue - pyriform;Pharyngeal residue - posterior pharnyx;Pharyngeal residue - cp segment;Inter-arytenoid space residue;Lateral channel residue Pharyngeal -- Pharyngeal- Thin Straw -- Pharyngeal -- Pharyngeal- Puree Delayed swallow initiation-vallecula;Delayed swallow initiation-pyriform sinuses;Reduced pharyngeal peristalsis;Reduced anterior laryngeal mobility;Reduced laryngeal elevation;Reduced tongue base retraction;Pharyngeal residue - valleculae;Pharyngeal residue - pyriform;Pharyngeal residue - posterior pharnyx;Pharyngeal residue -  cp segment;Inter-arytenoid space residue;Lateral channel residue Pharyngeal -- Pharyngeal- Mechanical Soft Delayed swallow initiation-pyriform sinuses;Reduced pharyngeal peristalsis;Reduced epiglottic inversion;Reduced anterior laryngeal mobility;Reduced laryngeal elevation;Reduced tongue base retraction;Pharyngeal residue - valleculae;Pharyngeal residue - pyriform;Pharyngeal residue - posterior pharnyx;Pharyngeal residue - cp segment;Inter-arytenoid space residue;Lateral channel residue Pharyngeal -- Pharyngeal- Regular -- Pharyngeal -- Pharyngeal- Multi-consistency -- Pharyngeal -- Pharyngeal- Pill Delayed swallow initiation-pyriform sinuses;Reduced pharyngeal peristalsis;Reduced anterior laryngeal mobility;Reduced laryngeal elevation;Reduced tongue base retraction;Pharyngeal residue - valleculae;Pharyngeal residue - pyriform;Pharyngeal residue - posterior pharnyx;Pharyngeal residue - cp segment;Inter-arytenoid space residue Pharyngeal -- Pharyngeal Comment --  [facility] IP CERVICAL ESOPHAGEAL PHASE 01/19/2020 Cervical Esophageal Phase Impaired Pudding Teaspoon -- Pudding Cup  -- Honey Teaspoon -- Honey Cup -- Nectar Teaspoon -- Nectar Cup -- Nectar Straw -- Thin Teaspoon -- Thin Cup -- Thin Straw -- Puree -- Mechanical Soft -- Regular -- Multi-consistency -- Pill (No Data) Cervical Esophageal Comment -- Happi B. Rutherford Nail, M.S., [place], [place] Office [phone] Stormy Fabian 01/19/2020, 8:00 PM                Medications   Scheduled Meds: . Chlorhexidine Gluconate Cloth  6 each Topical Daily  . enoxaparin (LOVENOX) injection  40 mg Subcutaneous Q24H  . fluconazole  150 mg Oral Once per day on Mon Thu  . melatonin  5 mg Oral QHS  . pantoprazole (PROTONIX) IV  40 mg Intravenous Daily  . pneumococcal 23 valent vaccine  0.5 mL Intramuscular Tomorrow-1000   Continuous Infusions: . sodium chloride 75 mL/hr at 01/19/20 0600  . ampicillin-sulbactam (UNASYN) IV 3 g (01/19/20 1709)       LOS: 3 days    Time spent: 45 minutes with >50% spent in coordination of care and direct patient contact.    Ezekiel Slocumb, DO [facility]  01/19/2020, 9:22 PM    If 7PM-7AM, please contact night-coverage. How to contact the [facility] Attending or Consulting provider [place] or covering provider during after hours [place], for this patient?    1. Check the care team in [facility] and look for a) attending/consulting [facility] provider listed and b) the [facility] team listed 2. Log into [email or web address] and use [place]'s universal password to access. If you do not have the password, please contact the hospital operator. 3. Locate the [facility] provider you are looking for under [facility] and page to a number that you can be directly reached. 4. If you still have difficulty reaching the provider, please page the [facility] (Director on Call) for the Hospitalists listed on amion for assistance.

## 2020-01-20 DIAGNOSIS — Z7189 Other specified counseling: Secondary | ICD-10-CM

## 2020-01-20 DIAGNOSIS — Z515 Encounter for palliative care: Secondary | ICD-10-CM

## 2020-01-20 LAB — CBC
HCT: 34.5 % — ABNORMAL LOW (ref 36.0–46.0)
Hemoglobin: 10.9 g/dL — ABNORMAL LOW (ref 12.0–15.0)
MCH: 26.2 pg (ref 26.0–34.0)
MCHC: 31.6 g/dL (ref 30.0–36.0)
MCV: 82.9 fL (ref 80.0–100.0)
Platelets: 171 10*3/uL (ref 150–400)
RBC: 4.16 MIL/uL (ref 3.87–5.11)
RDW: 14.1 % (ref 11.5–15.5)
WBC: 6.7 10*3/uL (ref 4.0–10.5)
nRBC: 0 % (ref 0.0–0.2)

## 2020-01-20 LAB — BASIC METABOLIC PANEL
Anion gap: 13 (ref 5–15)
BUN: 9 mg/dL (ref 8–23)
CO2: 23 mmol/L (ref 22–32)
Calcium: 7.9 mg/dL — ABNORMAL LOW (ref 8.9–10.3)
Chloride: 107 mmol/L (ref 98–111)
Creatinine, Ser: 0.71 mg/dL (ref 0.44–1.00)
GFR, Estimated: >60 mL/min (ref >60)
Glucose, Bld: 116 mg/dL — ABNORMAL HIGH (ref 70–99)
Potassium: 3 mmol/L — ABNORMAL LOW (ref 3.5–5.1)
Sodium: 143 mmol/L (ref 135–145)

## 2020-01-20 LAB — GLUCOSE, CAPILLARY
Glucose-Capillary: 103 mg/dL — ABNORMAL HIGH (ref 70–99)
Glucose-Capillary: 125 mg/dL — ABNORMAL HIGH (ref 70–99)
Glucose-Capillary: 131 mg/dL — ABNORMAL HIGH (ref 70–99)
Glucose-Capillary: 135 mg/dL — ABNORMAL HIGH (ref 70–99)
Glucose-Capillary: 142 mg/dL — ABNORMAL HIGH (ref 70–99)
Glucose-Capillary: 84 mg/dL (ref 70–99)

## 2020-01-20 LAB — HEMOGLOBIN A1C
Hgb A1c MFr Bld: 6.3 % — ABNORMAL HIGH (ref 4.8–5.6)
Mean Plasma Glucose: 134.11 mg/dL

## 2020-01-20 LAB — MAGNESIUM: Magnesium: 1.8 mg/dL (ref 1.7–2.4)

## 2020-01-20 MED ORDER — MECLIZINE HCL 12.5 MG PO TABS
12.5000 mg | ORAL_TABLET | Freq: Three times a day (TID) | ORAL | Status: DC | PRN
  Administered 2020-01-20: 12.5 mg via ORAL
  Filled 2020-01-20 (×2): qty 1

## 2020-01-20 MED ORDER — BUSPIRONE HCL 10 MG PO TABS
30.0000 mg | ORAL_TABLET | Freq: Two times a day (BID) | ORAL | Status: DC
  Administered 2020-01-20 – 2020-01-21 (×3): 30 mg via ORAL
  Filled 2020-01-20: qty 3
  Filled 2020-01-20 (×2): qty 2
  Filled 2020-01-20: qty 3

## 2020-01-20 MED ORDER — MAGNESIUM SULFATE 2 GM/50ML IV SOLN
2.0000 g | Freq: Once | INTRAVENOUS | Status: AC
  Administered 2020-01-20: 2 g via INTRAVENOUS
  Filled 2020-01-20: qty 50

## 2020-01-20 MED ORDER — METOPROLOL TARTRATE 25 MG PO TABS
25.0000 mg | ORAL_TABLET | Freq: Every day | ORAL | Status: DC
  Administered 2020-01-20: 25 mg via ORAL

## 2020-01-20 MED ORDER — INSULIN ASPART 100 UNIT/ML ~~LOC~~ SOLN: 0.0000 [IU] | Freq: Every day | SUBCUTANEOUS | Status: DC

## 2020-01-20 MED ORDER — ALLOPURINOL 100 MG PO TABS
100.0000 mg | ORAL_TABLET | Freq: Every day | ORAL | Status: DC
  Administered 2020-01-21: 100 mg via ORAL
  Filled 2020-01-20 (×3): qty 1

## 2020-01-20 MED ORDER — PRAVASTATIN SODIUM 20 MG PO TABS
10.0000 mg | ORAL_TABLET | Freq: Every day | ORAL | Status: DC
  Administered 2020-01-20: 10 mg via ORAL

## 2020-01-20 MED ORDER — ENSURE ENLIVE PO LIQD
237.0000 mL | Freq: Three times a day (TID) | ORAL | Status: DC
  Administered 2020-01-20 – 2020-01-21 (×2): 237 mL via ORAL

## 2020-01-20 MED ORDER — ESCITALOPRAM OXALATE 10 MG PO TABS
30.0000 mg | ORAL_TABLET | Freq: Every day | ORAL | Status: DC
  Administered 2020-01-20 – 2020-01-21 (×2): 30 mg via ORAL
  Filled 2020-01-20 (×2): qty 3

## 2020-01-20 MED ORDER — ROPINIROLE HCL 1 MG PO TABS
0.5000 mg | ORAL_TABLET | Freq: Every day | ORAL | Status: DC
  Administered 2020-01-20: 0.5 mg via ORAL
  Filled 2020-01-20: qty 1
  Filled 2020-01-20: qty 2

## 2020-01-20 MED ORDER — CALCIUM CARBONATE-VITAMIN D 500-200 MG-UNIT PO TABS
1.0000 | ORAL_TABLET | Freq: Every day | ORAL | Status: DC
  Administered 2020-01-20 – 2020-01-21 (×2): 1 via ORAL
  Filled 2020-01-20: qty 1

## 2020-01-20 MED ORDER — POTASSIUM CHLORIDE 10 MEQ/100ML IV SOLN
10.0000 meq | INTRAVENOUS | Status: AC
  Administered 2020-01-20 (×4): 10 meq via INTRAVENOUS
  Filled 2020-01-20 (×4): qty 100

## 2020-01-20 MED ORDER — ESCITALOPRAM OXALATE 10 MG PO TABS: 10.0000 mg | ORAL_TABLET | Freq: Every day | ORAL | Status: DC

## 2020-01-20 MED ORDER — HALOPERIDOL LACTATE 5 MG/ML IJ SOLN: 2.5000 mg | Freq: Once | INTRAMUSCULAR | Status: DC

## 2020-01-20 MED ORDER — GABAPENTIN 600 MG PO TABS
600.0000 mg | ORAL_TABLET | Freq: Three times a day (TID) | ORAL | Status: DC
  Administered 2020-01-20 – 2020-01-21 (×2): 600 mg via ORAL
  Filled 2020-01-20 (×6): qty 1

## 2020-01-20 MED ORDER — DIMENHYDRINATE 50 MG PO TABS: 25.0000 mg | ORAL_TABLET | Freq: Three times a day (TID) | ORAL | Status: DC | PRN

## 2020-01-20 MED ORDER — INSULIN ASPART 100 UNIT/ML ~~LOC~~ SOLN: 0.0000 [IU] | Freq: Three times a day (TID) | SUBCUTANEOUS | Status: DC

## 2020-01-20 NOTE — Progress Notes (Signed)
PT Cancellation Note  Patient Details Name: Angela David MRN: [id number] DOB: [birth year]   Cancelled Treatment:    Reason Eval/Treat Not Completed: Patient at procedure or test/unavailable;Other (comment) (Patient consult received and reviewed. Patient undergoing transfer from [facility] to stepdown unit. Will attempt again once available.)  Janna Arch, PT, DPT   01/20/2020, 4:28 PM

## 2020-01-20 NOTE — Progress Notes (Signed)
Angela David visited pt. while rounding on ICU; pt. known to Angela David from visits this past Spring during prior hospitalization; Pt. shared that she was admitted to hospital with aspiration pneumonia and that it seems she may have to switch to a liquid diet, at least temporarily, to avoid this happening again; pt.'s sister died in hospital in early 2023/01/13, pt. was primary caregiver and guardian; Pt. shared that she still misses her sister very much.  Pt. lives w/husband Angela David who is major source of support.  Pt. expressed that her faith in God and belief in prayer have been essential in helping her cope with numerous hospitalizations and grief this year.  [place] prayed for pt. at her request at end of visit.  CH remains available as needed.

## 2020-01-20 NOTE — Progress Notes (Signed)
[facility] NAME: Angela David    MR#:  [id number]  DATE OF BIRTH:  06-03-1940  SUBJECTIVE:    REVIEW OF SYSTEMS:   ROS Tolerating Diet: Tolerating PT:   DRUG ALLERGIES:   Allergies  Allergen Reactions  . Shellfish Allergy Shortness Of Breath    respitatory distress   . Sodium Sulfite Shortness Of Breath    Severe Congestion that creates inability to breath  . Sulfa Antibiotics Other (See Comments) and Shortness Of Breath    Other reaction(s): OTHER Other reaction(s): Other (See Comments) respitatory distress  Other reaction(s): OTHER respitatory distress  Other reaction(s): OTHER Other reaction(s): Other (See Comments) respitatory distress  respitatory distress   . Codeine Other (See Comments)    Per patient "it kept her up all night"    VITALS:  Blood pressure (!) 146/56, pulse 88, temperature 98.6 F (37 C), temperature source Oral, resp. rate 17, height 5\' 4"  (1.626 m), weight 72 kg, SpO2 100 %.  PHYSICAL EXAMINATION:   Physical Exam  GENERAL:  79 y.o.-year-old patient lying in the bed with no acute distress.  HEENT: Head atraumatic, normocephalic. Oropharynx and nasopharynx clear.  NECK:  Supple, no jugular venous distention. No thyroid enlargement, no tenderness.  LUNGS: Normal breath sounds bilaterally, no wheezing, rales, rhonchi. No use of accessory muscles of respiration.  CARDIOVASCULAR: S1, S2 normal. No murmurs, rubs, or gallops.  ABDOMEN: Soft, nontender, nondistended. Bowel sounds present. No organomegaly or mass.  EXTREMITIES: No cyanosis, clubbing or edema b/l.    NEUROLOGIC: Cranial nerves II through XII are intact. No focal Motor or sensory deficits b/l.   PSYCHIATRIC:  patient is alert and oriented x 3.  SKIN: No obvious rash, lesion, or ulcer.   LABORATORY PANEL:  CBC Recent Labs  Lab 01/20/20 0719  WBC 6.7  HGB 10.9*  HCT 34.5*  PLT 171    Chemistries  Recent Labs  Lab  01/15/20 1343 01/16/20 0512 01/20/20 0719  NA 138   < > 143  K 4.4   < > 3.0*  CL 104   < > 107  CO2 23   < > 23  GLUCOSE 183*   < > 116*  BUN 20   < > 9  CREATININE 0.89   < > 0.71  CALCIUM 9.4   < > 7.9*  MG  --    < > 1.8  AST 26  --   --   ALT 21  --   --   ALKPHOS 62  --   --   BILITOT 0.6  --   --    < > = values in this interval not displayed.   Cardiac Enzymes No results for input(s): TROPONINI in the last 168 hours. RADIOLOGY:  DG Swallowing Func-Speech Pathology  Result Date: 01/19/2020 Objective Swallowing Evaluation: Type of Study: MBS-Modified Barium Swallow Study  Patient Details Name: Angela David MRN: [id number] Date of Birth: March 03, 1940 Today's Date: 01/19/2020 Time: SLP Start Time (ACUTE ONLY): 1420 -SLP Stop Time (ACUTE ONLY): 1445 SLP Time Calculation (min) (ACUTE ONLY): 25 min Past Medical History: Past Medical History: Diagnosis Date . Breast cancer ([place]) 2002  right breast chemo and radiation . Cancer [facility]) 2002  right breast . Diabetes mellitus without complication ([place])  . Diabetic peripheral neuropathy ([place]) 08/28/2016 . GERD (gastroesophageal reflux disease)  . Glaucoma  . High cholesterol  . History of colon polyps  . Peripheral neuropathy 08/28/2016 . Personal  history of chemotherapy 2002  BREAST CA . Personal history of malignant neoplasm of breast  . Personal history of radiation therapy 2002  BREAST CA . Vocal cord paralysis 2012 Past Surgical History: Past Surgical History: Procedure Laterality Date . ABDOMINAL HYSTERECTOMY   . BREAST BIOPSY Right 2008  neg . BREAST EXCISIONAL BIOPSY Right 2002  positive . BREAST LUMPECTOMY Right 2002  chemo and radiation . BREAST SURGERY Right 2002  LUMPECTOMY, radiation, chemotherapy . CHOLECYSTECTOMY N/A 12/10/2017  Procedure: LAPAROSCOPIC CHOLECYSTECTOMY;  Surgeon: Benjamine Sprague, DO;  Location: [facility] ORS;  Service: General;  Laterality: N/A; . COLON RESECTION Right 11/06/2018  Procedure: HAND ASSISTED LAPAROSCOPIC COLON  RESECTION, RIGHT;  Surgeon: Jules Husbands, MD;  Location: [facility] ORS;  Service: General;  Laterality: Right; . COLONOSCOPY  2009,12/30/2013 . COLONOSCOPY WITH PROPOFOL N/A 04/16/2018  Tubulovillous polyp proximal transverse colon, large sessile polyp, proximal transverse colon;  Surgeon: Robert Bellow, MD;  Location: [facility] ENDOSCOPY;  Service: Endoscopy;  Laterality: N/A; . ESOPHAGOGASTRODUODENOSCOPY (EGD) WITH PROPOFOL N/A 10/14/2018  Procedure: ESOPHAGOGASTRODUODENOSCOPY (EGD) WITH PROPOFOL;  Surgeon: Lucilla Lame, MD;  Location: [facility] ENDOSCOPY;  Service: Endoscopy;  Laterality: N/A; . HYDROCELE EXCISION / REPAIR   . TRACHEAL SURGERY  2013 . TRACHEOSTOMY N/A   Dr Kathyrn Sheriff HPI: Angela David is a 79 y.o. female with medical history significant for hypertension, diabetes, history of trachea stenosis status post tracheostomy who presented to the ED on 01/15/20 with mental status changes with disorientation, not recognizing family, generalized weakness for several days.  Pt has been chronically trach dependent since 2013 and is followed by [facility] Otol[facility]ology and Anda Latina (ENT, [facility]). Pertinant information: May 31, 2019: MBSS at [facility] - "Pt presents with a mild oral and mild to moderate pharyngeal dysphagia c/b reduced oral manipulation, delayed swallow initiation, reduced BOT retraction and reduced pharyngeal constriction. These led to increased oral transit time, vallecula residue (mild - thin liquids, moderate - solids), pyriform sinus residue (mild - thin liquids, moderate - solids and trace penetration of solids. Penetration cleared with swallows. Residue significantly reduced with liquid rinse and subsequent swallows. Cervical esophagus: accumulation of solids within the UES/cervical esophagus essentially cleared with liquid rinse and subsequent swallows." June 24, 2019: The patient underwent MDL with excision of suprastomal granulation tissue and tracheal balloon dilation, and esophagoscopy with  UES balloon dilation. Currently pt has a fenestrated #6 [place] with inability to tolerate inner cannula and PMSV but per her husband she can achieve phonation with finger occulsion. Per husband's report, she has difficulty swallowing with it varying between moderate to severe.  Subjective: pt pleasant, alert, attentive to study Assessment / Plan / Recommendation CHL IP CLINICAL IMPRESSIONS 01/19/2020 Clinical Impression Imaging is difficult d/t pt's poor position. Despite having pillows and support to sit upright pt leaned to her right with her head laying to her left. Pt was positioned in an oblique position for best imagaing. Pt doesn't occlud her trach when eating. Trials were given with trach unoccluded and with SLP finger occluding trach hub. No change is oropharyngeal abilities were observed. Pt has a very complex tracheal and esophageal history. This Modified Barium Swallow was provided to assess for aspiration in an effort to return pt to an oral diet. Per attending report, pt's otolaryngologist doesn't have any further recommendations for trach management and her current ENT requested her trach to be left "as it is." Pt's HR continues to be elevated at ~ 100 but MD granted permission to have swallow study and pt's nurse  was present during the study for close monitoring. Pt on 5L trach collar. Aspiration was not observed during the study but again clear visualization was difficult to achieve d/t pt positioning. Pt is not able to perform any compensatory swallow strategies.  Pt presents with severe sensori-motor oropharyngeal dysphagia. Pt's oral phase is c/b decreased lingual manipulation of puree, mechanical soft, thin liquids and nectar thick liquids. Pt utilizes lingual pumping to help with posterior propulsion of boluses. Pt has premature spillage of all boluses into her pyriform sinuses and piecemeal swallowing. Her pharyngeal phase is characterized by extremely delayed swallow initiation, decreased  pharyneal squeeze and limited amounts of bolus transiting through the UES with each swallow. As a result, > 90% of each bolus residues in the pharynx that is only cleared thru the UES after 5 to 6 swallows. At baseline, pt consumes her medicine whole in puree. During this study pt cleared the puree thru her UES but the barium tablet resides in the cervical esophagus and required liquid wash and multiple swallows to clear. When completing multiple swallows per bolus, pt's HR climbed to 116 and to 118. Pt is at an extremely high risk of aspirating all PO intake as well as having a cardiac response to multiple swallows. A conservative diet would be dysphagia 1 with thin liquids via cup, medicine crushed in puree. Also recommend a consult for Palliative Care to help with GOC.  SLP Visit Diagnosis Dysphagia, oropharyngeal phase (R13.12);Dysphagia, pharyngoesophageal phase (R13.14) Attention and concentration deficit following -- Frontal lobe and executive function deficit following -- Impact on safety and function Severe aspiration risk;Risk for inadequate nutrition/hydration   CHL IP TREATMENT RECOMMENDATION 01/19/2020 Treatment Recommendations No treatment recommended at this time   Prognosis 01/19/2020 Prognosis for Safe Diet Advancement Guarded Barriers to Reach Goals Severity of deficits;Time post onset Barriers/Prognosis Comment -- CHL IP DIET RECOMMENDATION 01/19/2020 SLP Diet Recommendations Dysphagia 1 (Puree) solids;Thin liquid Liquid Administration via Cup;No straw Medication Administration Whole meds with puree Compensations Minimize environmental distractions;Slow rate;Small sips/bites Postural Changes Seated upright at 90 degrees   CHL IP OTHER RECOMMENDATIONS 01/19/2020 Recommended Consults (No Data) Oral Care Recommendations Oral care BID Other Recommendations --   CHL IP FOLLOW UP RECOMMENDATIONS 01/19/2020 Follow up Recommendations (No Data)   CHL IP FREQUENCY AND DURATION 01/18/2020 Speech Therapy  Frequency (ACUTE ONLY) min 2x/week Treatment Duration 2 weeks      CHL IP ORAL PHASE 01/19/2020 Oral Phase Impaired Oral - Pudding Teaspoon -- Oral - Pudding Cup -- Oral - Honey Teaspoon -- Oral - Honey Cup -- Oral - Nectar Teaspoon Weak lingual manipulation;Lingual pumping;Reduced posterior propulsion;Holding of bolus;Lingual/palatal residue;Piecemeal swallowing;Delayed oral transit;Decreased bolus cohesion;Premature spillage Oral - Nectar Cup Weak lingual manipulation;Lingual pumping;Reduced posterior propulsion;Holding of bolus;Lingual/palatal residue;Piecemeal swallowing;Delayed oral transit;Decreased bolus cohesion;Premature spillage Oral - Nectar Straw -- Oral - Thin Teaspoon Weak lingual manipulation;Lingual pumping;Reduced posterior propulsion;Holding of bolus;Lingual/palatal residue;Piecemeal swallowing;Delayed oral transit;Decreased bolus cohesion;Premature spillage Oral - Thin Cup Weak lingual manipulation;Lingual pumping;Reduced posterior propulsion;Holding of bolus;Lingual/palatal residue;Piecemeal swallowing;Delayed oral transit;Decreased bolus cohesion;Premature spillage Oral - Thin Straw -- Oral - Puree Weak lingual manipulation;Lingual pumping;Reduced posterior propulsion;Holding of bolus;Lingual/palatal residue;Piecemeal swallowing;Delayed oral transit;Decreased bolus cohesion;Premature spillage Oral - Mech Soft Impaired mastication;Weak lingual manipulation;Lingual pumping;Reduced posterior propulsion;Holding of bolus;Piecemeal swallowing;Delayed oral transit;Decreased bolus cohesion;Lingual/palatal residue Oral - Regular -- Oral - Multi-Consistency -- Oral - Pill Weak lingual manipulation;Lingual pumping;Reduced posterior propulsion;Holding of bolus;Lingual/palatal residue;Piecemeal swallowing;Delayed oral transit;Decreased bolus cohesion;Premature spillage Oral Phase - Comment --  CHL IP PHARYNGEAL PHASE 01/19/2020 Pharyngeal Phase  Impaired Pharyngeal- Pudding Teaspoon -- Pharyngeal --  Pharyngeal- Pudding Cup -- Pharyngeal -- Pharyngeal- Honey Teaspoon -- Pharyngeal -- Pharyngeal- Honey Cup -- Pharyngeal -- Pharyngeal- Nectar Teaspoon Delayed swallow initiation-pyriform sinuses;Reduced pharyngeal peristalsis;Reduced epiglottic inversion;Reduced anterior laryngeal mobility;Reduced laryngeal elevation;Reduced tongue base retraction;Pharyngeal residue - valleculae;Pharyngeal residue - pyriform;Pharyngeal residue - posterior pharnyx;Pharyngeal residue - cp segment;Lateral channel residue;Inter-arytenoid space residue Pharyngeal -- Pharyngeal- Nectar Cup Delayed swallow initiation-pyriform sinuses;Reduced pharyngeal peristalsis;Reduced epiglottic inversion;Reduced anterior laryngeal mobility;Reduced laryngeal elevation;Reduced tongue base retraction;Pharyngeal residue - pyriform;Pharyngeal residue - posterior pharnyx;Pharyngeal residue - cp segment;Pharyngeal residue - valleculae;Inter-arytenoid space residue;Lateral channel residue Pharyngeal -- Pharyngeal- Nectar Straw -- Pharyngeal -- Pharyngeal- Thin Teaspoon Delayed swallow initiation-pyriform sinuses;Reduced pharyngeal peristalsis;Reduced epiglottic inversion;Reduced anterior laryngeal mobility;Reduced laryngeal elevation;Reduced tongue base retraction;Pharyngeal residue - valleculae;Pharyngeal residue - pyriform;Pharyngeal residue - posterior pharnyx;Pharyngeal residue - cp segment;Inter-arytenoid space residue;Lateral channel residue Pharyngeal -- Pharyngeal- Thin Cup Delayed swallow initiation-pyriform sinuses;Reduced pharyngeal peristalsis;Reduced epiglottic inversion;Reduced anterior laryngeal mobility;Reduced laryngeal elevation;Reduced tongue base retraction;Pharyngeal residue - valleculae;Pharyngeal residue - pyriform;Pharyngeal residue - posterior pharnyx;Pharyngeal residue - cp segment;Inter-arytenoid space residue;Lateral channel residue Pharyngeal -- Pharyngeal- Thin Straw -- Pharyngeal -- Pharyngeal- Puree Delayed swallow  initiation-vallecula;Delayed swallow initiation-pyriform sinuses;Reduced pharyngeal peristalsis;Reduced anterior laryngeal mobility;Reduced laryngeal elevation;Reduced tongue base retraction;Pharyngeal residue - valleculae;Pharyngeal residue - pyriform;Pharyngeal residue - posterior pharnyx;Pharyngeal residue - cp segment;Inter-arytenoid space residue;Lateral channel residue Pharyngeal -- Pharyngeal- Mechanical Soft Delayed swallow initiation-pyriform sinuses;Reduced pharyngeal peristalsis;Reduced epiglottic inversion;Reduced anterior laryngeal mobility;Reduced laryngeal elevation;Reduced tongue base retraction;Pharyngeal residue - valleculae;Pharyngeal residue - pyriform;Pharyngeal residue - posterior pharnyx;Pharyngeal residue - cp segment;Inter-arytenoid space residue;Lateral channel residue Pharyngeal -- Pharyngeal- Regular -- Pharyngeal -- Pharyngeal- Multi-consistency -- Pharyngeal -- Pharyngeal- Pill Delayed swallow initiation-pyriform sinuses;Reduced pharyngeal peristalsis;Reduced anterior laryngeal mobility;Reduced laryngeal elevation;Reduced tongue base retraction;Pharyngeal residue - valleculae;Pharyngeal residue - pyriform;Pharyngeal residue - posterior pharnyx;Pharyngeal residue - cp segment;Inter-arytenoid space residue Pharyngeal -- Pharyngeal Comment --  CHL IP CERVICAL ESOPHAGEAL PHASE 01/19/2020 Cervical Esophageal Phase Impaired Pudding Teaspoon -- Pudding Cup -- Honey Teaspoon -- Honey Cup -- Nectar Teaspoon -- Nectar Cup -- Nectar Straw -- Thin Teaspoon -- Thin Cup -- Thin Straw -- Puree -- Mechanical Soft -- Regular -- Multi-consistency -- Pill (No Data) Cervical Esophageal Comment -- Happi B. Rutherford Nail M.S., CCC-SLP, [facility] Speech-Language Pathologist Rehabilitation Services Office [phone] Stormy Fabian 01/19/2020, 8:00 PM              ASSESSMENT AND PLAN:  Angela David a 79 y.o.femalewith medical history significant forhypertension, diabetes, history of trachea stenosis  status post tracheostomy who presented to the ED on 01/15/20 with mental status changes with disorientation, not recognizing family, generalized weakness for several days.   Evaluation in the ED was mostly unrevealing except for possible right lower lobe pneumonia.  Patient improving with IV antibiotics  Acute encephalopathy / AMS - likely due to delirium with PNA, possibly underlying mild vascular dementia given MRI findings.  VBG normal without hypercapnea.  Normal ammonia level. TSH normal.   -CT head negative for anything acute.  UA negative.  - Chest xray with LLL opacity and pleural effusion.   -Vitamin D normal and B12 elevated.  MRI was negative for CVA.  No hx of seizure. -MRI brain 11/20 showed only advanced chronic microvascular disease and atrophy, no stroke, mass or other findings to explain her confusion and hallucinations. -- Patient's mentation much improved. ---Treat for PNA as below -- okay to transfer to MedSurg floor  Left lower lobe pneumonia / Aspiration / Left pleural effusion - POA by imaging.  Possibly  causing her confusion and hallucinations.  Pt was not having respiratory symptoms, no leukocytosis and normal procal.  She is likely aspirating. 11/21 - purulent appearing trach secretions, episodes of distress, transferred to stepdown closer monitoring 11/22-23 - improved mentation, improving secretions --Continue Unasyn --SLP consulted --Modified barium swallow study today - see report.  Has severe dysphagia currently-- however tolerating PO diet and follow recommendations by speech therapy. --Monitor O2 sat and provide supplemental O2 if needed to keep sats > 88% --11/24-- afebrile sats hundred percent on room air.   Generalized weakness / Gait abnormality - possibly due to infection.  PT [name] evaluations ending  Recent Fall / Right distal clavicular fracture - no complaint of pain from patient, discovered this on chart review when seen in ED 11/11 after a  fall.  She was placed in sling and to follow up with ortho in 1 week, but ended up admitted.   Pt told ED physician she doesn't tolerate pain meds, okay just with Tylenol. --Monitor  Anxiety / Depression - continue home Buspar, Lexapro  Hyperlipidemia - continue statin  chronic tracheostomy contrary to vocal cord paralysis in the past - no acute issues.   -  Trach collar O2 as needed.  --Dr Arbutus Ped had spoken with patient's ENT Dr. Early Osmond at [facility] at request of SLP due to her complex trach history and current state.  No additional recommendations to add.  Follow up outpatient.     Type 2 diabetes mellitus - hold metformin.  Sliding scale Novolog.  Last A1c in Jan 2021 was 6.6%.  Peripheral neuropathy - resumed gabapentin   GERD - continue PPI   DVT prophylaxis: enoxaparin (LOVENOX) injection 40 mg   Patient was evaluated by palliative care.-- Command outpatient palliative care follow-up   Procedures:none Family communication : Consults :none CODE STATUS: FULl DVT Prophylaxis :Lovenox  Status is: Inpatient  Remains inpatient appropriate because:Ongoing diagnostic testing needed not appropriate for outpatient work up and IV treatments appropriate due to intensity of illness or inability to take PO   Dispo:  Patient From: Home  Planned Disposition: Home  Expected discharge date: 01/21/20  Medically stable for discharge: No patient overall improving. Mentation near baseline. Will observe her one more day. Transfer to medical floor. PT eval pending. If remains stable will likely discharge home tomorrow.        TOTAL TIME TAKING CARE OF THIS PATIENT: 25 minutes.  >50% time spent on counselling and coordination of care  Note: This dictation was prepared with Dragon dictation along with smaller phrase technology. Any transcriptional errors that result from this process are unintentional.  Fritzi Mandes M.D    [facility]   CC: Primary care physician;  Iva Boop, MDPatient ID: Angela David, female   DOB: 1940/10/17, 79 y.o.   MRN: [id number]

## 2020-01-20 NOTE — Progress Notes (Signed)
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 Patient on the phone with husband, asking him to pick her up. Both RN and husband attempted to redirect and reorient patient, telling her she is in the hospital and not at her store. Patient then begins to say, Take me back to the hospital," to both RN and husband. Many times, RN tried to reassure patient that she never left the hospital to no avail. Patient kept telling her husband to pick her up and take her back to the hospital. Charge RN called to bedside to try to reorient patient to no success. NP notified. Will continue to monitor.

## 2020-01-20 NOTE — Consult Note (Addendum)
Consultation Note Date: 01/20/2020   Patient Name: Angela David  DOB: 06-26-1940  MRN: [id number]  Age / Sex: 79 y.o., female  PCP: Angela Boop, MD Referring Physician: Fritzi Mandes, MD  Reason for Consultation: Establishing goals of care  HPI/Patient Profile:  Angela Digilio Sumneris a 79 y.o.femalewith medical history significant forhypertension, diabetes, history of trachea stenosis status post tracheostomy who presented to the ED on 01/15/20 with mental status changes with disorientation, not recognizing family, generalized weakness for several days.   Clinical Assessment and Goals of Care: Patient is resting in bed. Husband is at bedside. She states she is in the hospital very frequently, that it seems to be "one thing after another". She discusses her medical history in detail. She has been using a cane, and per husband, she has had issues with balance. She states she does not like to use the inner cannula of her trach as it uses too much energy. She is unable to use a PMV as it pops off when she tries to speak with it.   We discussed her diagnosis including her swallow study, prognosis, GOC, EOL wishes disposition and options.  Created space and opportunity for patient  to explore thoughts and feelings regarding current medical information.   A detailed discussion was had today regarding advanced directives.  Concepts specific to code status, artifical feeding and hydration, IV antibiotics and rehospitalization were discussed.  The difference between an aggressive medical intervention path and a comfort care path was discussed.  Values and goals of care important to patient and family were attempted to be elicited.  Discussed limitations of medical interventions to prolong quality of life in some situations and discussed the concept of human mortality.  She discusses that her sister  died in early 01-05-23, and begins to cry. Offered for her to speak about her concerns. She states she has not been happy with her QOL in quite a while. She states she is a woman of faith. She states she is not ready to talk about her sister or about her own death, and adds that she may never be. She states she wants to do whatever she can to try to live, that she does not want to die. She is unsure of any boundaries on healthcare but will consider this.    Will continue to follow.      SUMMARY OF RECOMMENDATIONS Full code/full scope   Recommend palliative outpatient. Recommended patient to see her ENT to discuss another trach that allows a PMV so that she does not have to occlude with her finger as she says a PMV pops off her current trach.  Prognosis:   Unable to determine      Primary Diagnoses: Present on Admission: . (Resolved) Altered mental status . Anxiety state . (Resolved) Atrial fibrillation with RVR ([place]) . Acute encephalopathy . HLD (hyperlipidemia) . Left lower lobe pneumonia   I have reviewed the medical record, interviewed the patient and family, and examined the patient. The following aspects are pertinent.  Past Medical History:  Diagnosis Date  . Breast cancer ([place]) 2002   right breast chemo and radiation  . Cancer [facility]) 2002   right breast  . Diabetes mellitus without complication ([place])   . Diabetic peripheral neuropathy ([place]) 08/28/2016  . GERD (gastroesophageal reflux disease)   . Glaucoma   . High cholesterol   . History of colon polyps   . Peripheral neuropathy 08/28/2016  . Personal history of chemotherapy 2002   BREAST CA  . Personal history of malignant neoplasm of breast   . Personal history of radiation therapy 2002   BREAST CA  . Vocal cord paralysis 2012   Social History   Socioeconomic History  . Marital status: Married    Spouse name: Angela David  . Number of children: Not on file  . Years of education: Not on file  . Highest education  level: Not on file  Occupational History  . Not on file  Tobacco Use  . Smoking status: Never Smoker  . Smokeless tobacco: Never Used  Vaping Use  . Vaping Use: Never used  Substance and Sexual Activity  . Alcohol use: No  . Drug use: No  . Sexual activity: Not on file  Other Topics Concern  . Not on file  Social History Narrative   Lives with husband   Social Determinants of Health   Financial Resource Strain:   . Difficulty of Paying Living Expenses: Not on file  Food Insecurity:   . Worried About Charity fundraiser in the Last Year: Not on file  . Ran Out of Food in the Last Year: Not on file  Transportation Needs:   . Lack of Transportation (Medical): Not on file  . Lack of Transportation (Non-Medical): Not on file  Physical Activity:   . Days of Exercise per Week: Not on file  . Minutes of Exercise per Session: Not on file  Stress:   . Feeling of Stress : Not on file  Social Connections:   . Frequency of Communication with Friends and Family: Not on file  . Frequency of Social Gatherings with Friends and Family: Not on file  . Attends Religious Services: Not on file  . Active Member of Clubs or Organizations: Not on file  . Attends Archivist Meetings: Not on file  . Marital Status: Not on file   Family History  Problem Relation Age of Onset  . Cancer Father        bone cancer  . Cancer Sister        lymphoma  . Cancer Brother   . Breast cancer Neg Hx    Scheduled Meds: . allopurinol  100 mg Oral Daily  . busPIRone  30 mg Oral BID  . calcium-vitamin D  1 tablet Oral Daily  . Chlorhexidine Gluconate Cloth  6 each Topical Daily  . enoxaparin (LOVENOX) injection  40 mg Subcutaneous Q24H  . escitalopram  30 mg Oral Daily  . feeding supplement  237 mL Oral TID BM  . fluconazole  150 mg Oral Once per day on Mon Thu  . melatonin  5 mg Oral QHS  . metoprolol tartrate  25 mg Oral Daily  . pneumococcal 23 valent vaccine  0.5 mL Intramuscular  Tomorrow-1000  . pravastatin  10 mg Oral q1800  . rOPINIRole  0.5 mg Oral QHS   Continuous Infusions: . ampicillin-sulbactam (UNASYN) IV Stopped (01/20/20 8127)  . magnesium sulfate bolus IVPB    . potassium chloride  PRN Meds:.acetaminophen, diclofenac Sodium, labetalol, meclizine, polyvinyl alcohol Medications Prior to Admission:  Prior to Admission medications   Medication Sig Start Date End Date Taking? Authorizing Provider  acetaminophen (TYLENOL) 325 MG tablet Take 2 tablets (650 mg total) by mouth every 6 (six) hours as needed for moderate pain or fever. 04/01/19  Yes Tyler Pita, MD  allopurinol (ZYLOPRIM) 100 MG tablet Take 100 mg by mouth daily.   Yes [provider]  busPIRone (BUSPAR) 15 MG tablet Take 30 mg by mouth 2 (two) times daily.   Yes [provider]  Calcium Carbonate-Vitamin D 600-400 MG-UNIT tablet Take 1 tablet by mouth daily.    Yes [provider]  escitalopram (LEXAPRO) 10 MG tablet Take 10 mg by mouth daily. (take with 20mg  tablet to equal 30mg  total daily)   Yes [provider]  escitalopram (LEXAPRO) 20 MG tablet Take 20 mg by mouth daily. (take with 10mg  tablet to equal 30mg  total daily)   Yes [provider]  fluconazole (DIFLUCAN) 150 MG tablet Take 150 mg by mouth 2 (two) times a week.   Yes [provider]  folic acid (FOLVITE) 1 MG tablet Take 1 mg by mouth daily.   Yes [provider]  gabapentin (NEURONTIN) 600 MG tablet Take 600 mg by mouth 3 (three) times daily. 05/26/19  Yes [provider]  lovastatin (MEVACOR) 20 MG tablet Take 20 mg by mouth at bedtime.   Yes [provider]  Melatonin 5 MG TABS Take 1 tablet (5 mg total) by mouth at bedtime. 04/01/19  Yes Tyler Pita, MD  meloxicam (MOBIC) 15 MG tablet Take 15 mg by mouth daily. 01/01/20  Yes [provider]  metFORMIN (GLUCOPHAGE) 1000 MG tablet Take 500 mg by mouth 2 (two) times daily.    Yes  [provider]  metoprolol tartrate (LOPRESSOR) 25 MG tablet Take 1 tablet (25 mg total) by mouth 2 (two) times daily. Patient taking differently: Take 25 mg by mouth daily.  04/01/19  Yes Tyler Pita, MD  Nebulizers (COMPRESSOR/NEBULIZER) MISC Nebulizer with supplies, trach collar for med admin. 04/01/19  Yes Tyler Pita, MD  pantoprazole (PROTONIX) 40 MG tablet Take 40 mg by mouth daily.    Yes [provider]  rOPINIRole (REQUIP) 0.5 MG tablet Take 0.5 mg by mouth at bedtime.  06/09/18  Yes [provider]  sodium chloride 0.9 % nebulizer solution Take 3 mLs by nebulization as needed for wheezing.  07/17/19  Yes [provider]   Allergies  Allergen Reactions  . Shellfish Allergy Shortness Of Breath    respitatory distress   . Sodium Sulfite Shortness Of Breath    Severe Congestion that creates inability to breath  . Sulfa Antibiotics Other (See Comments) and Shortness Of Breath    Other reaction(s): OTHER Other reaction(s): Other (See Comments) respitatory distress  Other reaction(s): OTHER respitatory distress  Other reaction(s): OTHER Other reaction(s): Other (See Comments) respitatory distress  respitatory distress   . Codeine Other (See Comments)    Per patient "it kept her up all night"   Review of Systems  Constitutional: Positive for fatigue.    Physical Exam Pulmonary:     Comments: Trach in place.  Neurological:     Mental Status: She is alert.     Vital Signs: BP (!) 144/55   Pulse 89   Temp 98.8 F (37.1 C) (Oral)   Resp (!) 21   Ht 5\' 4"  (1.626 m)  Wt 72 kg   SpO2 97%   BMI 27.25 kg/m  Pain Scale: 0-10   Pain Score: Asleep   SpO2: SpO2: 97 % O2 Device:SpO2: 97 % O2 Flow Rate: .O2 Flow Rate (L/min): 5 L/min  IO: Intake/output summary:   Intake/Output Summary (Last 24 hours) at 01/20/2020 1553 Last data filed at 01/20/2020 0900 Gross per 24 hour  Intake 1087.96 ml  Output 650 ml  Net 437.96 ml     LBM: Last BM Date: 01/18/20 Baseline Weight: Weight: 70.3 kg Most recent weight: Weight: 72 kg     Palliative Assessment/Data:     Time In: 3:30 Time Out: 4:00 Time Total: 30 min Greater than 50%  of this time was spent counseling and coordinating care related to the above assessment and plan.  Signed by: Asencion Gowda, NP   Please contact Palliative Medicine Team phone at [phone] for questions and concerns.  For individual provider: See Shea Evans

## 2020-01-20 NOTE — Progress Notes (Signed)
Nutrition Follow-up  DOCUMENTATION CODES:   Not applicable  INTERVENTION:  Provide Ensure Enlive po TID, each supplement provides 350 kcal and 20 grams of protein. Patient prefers chocolate.  Provide Magic cup BID with lunch and dinner, each supplement provides 290 kcal and 9 grams of protein. Patient prefers vanilla.  Provide chocolate pudding with trays per patient request.  NUTRITION DIAGNOSIS:   Inadequate oral intake related to inability to eat as evidenced by NPO status.  Resolving - diet now advanced but intake still decreased.  GOAL:   Patient will meet greater than or equal to 90% of their needs  Progressing.  MONITOR:   Diet advancement, Labs, Weight trends, I & O's  REASON FOR ASSESSMENT:   Malnutrition Screening Tool    ASSESSMENT:   79 year old female with PMHx of breast cancer s/p lumpectomy and chemo/XRT, DM, diabetic peripheral neuropathy, GERD, vocal cord paralysis, tracheal stenosis requiring chronic tracheostomy admitted with acute encephalopathy/AMS, left lower lobe PNA/aspiration, dysphagia.  11/23 s/p MBSS and diet advanced to dysphagia 1 with thin liquids  Met with patient at bedside. She is on O2 5 L/min via trach collar. Patient reports her appetite is good but that she does not like pureed foods. Discussed options available on menu patient may like better. She likes chocolate ensure and requests these. She also would like chocolate pudding on her trays and is amenable to trying vanilla [employer].  Medications reviewed and include: Diflucan, Protonix, Unasyn.  Labs reviewed: CBG 84-103, Potassium 3.  Diet Order:   Diet Order            DIET - DYS 1 Room service appropriate? Yes; Fluid consistency: Thin  Diet effective now                EDUCATION NEEDS:   No education needs have been identified at this time  Skin:  Skin Assessment: Reviewed RN Assessment  Last BM:  01/17/2020  Height:   Ht Readings from Last 1 Encounters:   01/15/20 '5\' 4"'  (1.626 m)   Weight:   Wt Readings from Last 1 Encounters:  01/17/20 72 kg   BMI:  Body mass index is 27.25 kg/m.  Estimated Nutritional Needs:   Kcal:  1800-2000  Protein:  90-100 grams  Fluid:  1.8 L/day  [name], MS, RD, LDN Pager number available on Amion

## 2020-01-21 LAB — GLUCOSE, CAPILLARY
Glucose-Capillary: 153 mg/dL — ABNORMAL HIGH (ref 70–99)
Glucose-Capillary: 156 mg/dL — ABNORMAL HIGH (ref 70–99)
Glucose-Capillary: 122 mg/dL — ABNORMAL HIGH (ref 70–99)

## 2020-01-21 LAB — BLOOD GAS, VENOUS
Acid-Base Excess: 6.8 mmol/L — ABNORMAL HIGH (ref 0.0–2.0)
Bicarbonate: 31.3 mmol/L — ABNORMAL HIGH (ref 20.0–28.0)
O2 Saturation: 92.2 %
Patient temperature: 37
pCO2, Ven: 43 mmHg — ABNORMAL LOW (ref 44.0–60.0)
pH, Ven: 7.47 — ABNORMAL HIGH (ref 7.250–7.430)
pO2, Ven: 60 mmHg — ABNORMAL HIGH (ref 32.0–45.0)

## 2020-01-21 MED ORDER — METOPROLOL TARTRATE 25 MG PO TABS
25.0000 mg | ORAL_TABLET | Freq: Two times a day (BID) | ORAL | Status: DC
  Administered 2020-01-21: 25 mg via ORAL
  Filled 2020-01-21: qty 1

## 2020-01-21 MED ORDER — AMOXICILLIN-POT CLAVULANATE 875-125 MG PO TABS: 1.0000 | ORAL_TABLET | Freq: Two times a day (BID) | ORAL | 0 refills | Status: DC

## 2020-01-21 MED ORDER — ENSURE ENLIVE PO LIQD
237.0000 mL | Freq: Three times a day (TID) | ORAL | 12 refills | Status: DC
Start: 2020-01-21 — End: 2020-02-13

## 2020-01-21 MED ORDER — SCOPOLAMINE 1 MG/3DAYS TD PT72
1.0000 | MEDICATED_PATCH | TRANSDERMAL | Status: DC
  Administered 2020-01-21: 1.5 mg via TRANSDERMAL
  Filled 2020-01-21: qty 1

## 2020-01-21 MED ORDER — SCOPOLAMINE 1 MG/3DAYS TD PT72: 1.0000 | MEDICATED_PATCH | TRANSDERMAL | 1 refills | Status: DC

## 2020-01-21 MED ORDER — AMOXICILLIN-POT CLAVULANATE 875-125 MG PO TABS
1.0000 | ORAL_TABLET | Freq: Two times a day (BID) | ORAL | Status: DC
  Administered 2020-01-21: 1 via ORAL
  Filled 2020-01-21: qty 1

## 2020-01-21 MED ORDER — AMLODIPINE BESYLATE 10 MG PO TABS
10.0000 mg | ORAL_TABLET | Freq: Every day | ORAL | Status: DC
  Administered 2020-01-21: 10 mg via ORAL
  Filled 2020-01-21: qty 1

## 2020-01-21 MED ORDER — PNEUMOCOCCAL VAC POLYVALENT 25 MCG/0.5ML IJ INJ
0.5000 mL | INJECTION | INTRAMUSCULAR | Status: AC | PRN
  Administered 2020-01-21: 0.5 mL via INTRAMUSCULAR
  Filled 2020-01-21: qty 0.5

## 2020-01-21 MED ORDER — AMLODIPINE BESYLATE 10 MG PO TABS
10.0000 mg | ORAL_TABLET | Freq: Every day | ORAL | 1 refills | Status: DC
Start: 2020-01-22 — End: 2020-02-13

## 2020-01-21 NOTE — Discharge Summary (Signed)
Angela David NAME: Angela David    MR#:  [id number]  DATE OF BIRTH:  1940-08-12  DATE OF ADMISSION:  01/15/2020 ADMITTING PHYSICIAN: Ezekiel Slocumb, DO  DATE OF DISCHARGE: 01/21/2020  PRIMARY CARE PHYSICIAN: Iva Boop, MD    ADMISSION DIAGNOSIS:  Hallucination [R44.3] Altered mental status [R41.82] Difficulty walking [R26.2] Swelling of joint of lower leg [M25.469] Acute congestive heart failure, unspecified heart failure type ([place]) [I50.9] Swelling of lower leg [M79.89] Atrial fibrillation with RVR ([place]) [I48.91]  DISCHARGE DIAGNOSIS:  Acute Encephalopathy/AMS--improve Pneumonia-aspiration Chronic Tracheostomy with h/o VC paralysis  SECONDARY DIAGNOSIS:   Past Medical History:  Diagnosis Date  . Breast cancer ([place]) 2002   right breast chemo and radiation  . Cancer [facility]) 2002   right breast  . Diabetes mellitus without complication ([place])   . Diabetic peripheral neuropathy ([place]) 08/28/2016  . GERD (gastroesophageal reflux disease)   . Glaucoma   . High cholesterol   . History of colon polyps   . Peripheral neuropathy 08/28/2016  . Personal history of chemotherapy 2002   BREAST CA  . Personal history of malignant neoplasm of breast   . Personal history of radiation therapy 2002   BREAST CA  . Vocal cord paralysis 2012    HOSPITAL COURSE:   [place] a 79 y.o.femalewith medical history significant forhypertension, diabetes, history of trachea stenosis status post tracheostomy who presented to the ED on 01/15/20 with mental status changes with disorientation, not recognizing family, generalized weakness for several days.  Evaluation in the ED was mostly unrevealing except for possible right lower lobe pneumonia. Patient improving with IV antibiotics  Acute encephalopathy / AMS- likely due to delirium with PNA, possibly underlying mild vascular dementia given MRI findings. VBG  normal without hypercapnea. Normal ammonia level. TSH normal.  -CT head negative for anything acute. UA negative.  -Chest xray with LLL opacity and pleural effusion.  -Vitamin D normal and B12 elevated. MRI was negative for CVA. No hx of seizure. -MRI brain 11/20 showed only advanced chronic microvascular disease and atrophy, no stroke, mass or other findings to explain her confusion and hallucinations. --Patient's mentation much improved. --Treat for PNA as below  Left lower lobe pneumonia / Aspiration / Left pleural effusion- POA by imaging. Possibly causing her confusion and hallucinations. Pt wasnot havingrespiratory symptoms, no leukocytosis and normal procal. She is likely aspirating. 11/21 - purulent appearing trach secretions, episodes of distress, transferred to stepdown closer monitoring 11/22-23 - improved mentation, improving secretions --Continue Unasyn--augmentin --SLPconsult appreciated --Modified barium swallowstudy today - see report. Has severe dysphagia currently-- however tolerating PO diet and follow recommendations by speech therapy. ---o2 sats >92% --11/24-- afebrile sats hundred percent on room air. --11/25-- scopolamine patch for secretions  Generalized weakness / Gait abnormality- possibly due to infection.  -PT [name] evaluations not done since pt is not wanting to participate  Recent Fall / Right distal clavicular fracture Stable  Anxiety / Depression- continue home Buspar, Lexapro  Hyperlipidemia- continue statin  chronic tracheostomy contrary to vocal cord paralysis in the past - no acute issues.  - Trach collar O2 as needed.  --Dr Arbutus Ped had spoken with patient's ENT Dr. Early Osmond at [facility] at request of SLP due to her complex trach history and current state. No additional recommendations to add. -scopolamine patch  Type 2 diabetes mellitus- hold metformin at dicharge  Sliding scale Novolog. Last A1c in Jan 2021 was  6.6%.  Peripheral neuropathy-  resumed gabapentin   GERD- continue PPI  DVT prophylaxis:enoxaparin (LOVENOX) injection 40 mg   Patient was evaluated by palliative care.-- recommends outpatient palliative care follow-up   Procedures:none Family communication :husband on the phone Consults :none CODE STATUS: FULl DVT Prophylaxis :Lovenox  Status is: Inpatient  Dispo:             Patient From: Home             Planned Disposition: Home             Expected discharge date: 01/21/20             Medically stable for discharge: No patient overall improving. Mentation near baseline. Pt did not want to participate with PT but was able to get OOB to chair with RN resume out pt PT.  D/c to home CONSULTS OBTAINED:    DRUG ALLERGIES:   Allergies  Allergen Reactions  . Shellfish Allergy Shortness Of Breath    respitatory distress   . Sodium Sulfite Shortness Of Breath    Severe Congestion that creates inability to breath  . Sulfa Antibiotics Other (See Comments) and Shortness Of Breath    Other reaction(s): OTHER Other reaction(s): Other (See Comments) respitatory distress  Other reaction(s): OTHER respitatory distress  Other reaction(s): OTHER Other reaction(s): Other (See Comments) respitatory distress  respitatory distress   . Codeine Other (See Comments)    Per patient "it kept her up all night"    DISCHARGE MEDICATIONS:   Allergies as of 01/21/2020      Reactions   Shellfish Allergy Shortness Of Breath   respitatory distress    Sodium Sulfite Shortness Of Breath   Severe Congestion that creates inability to breath   Sulfa Antibiotics Other (See Comments), Shortness Of Breath   Other reaction(s): OTHER Other reaction(s): Other (See Comments) respitatory distress  Other reaction(s): OTHER respitatory distress  Other reaction(s): OTHER Other reaction(s): Other (See Comments) respitatory distress  respitatory distress    Codeine Other (See  Comments)   Per patient "it kept her up all night"      Medication List    STOP taking these medications   metFORMIN 1000 MG tablet Commonly known as: GLUCOPHAGE     TAKE these medications   acetaminophen 325 MG tablet Commonly known as: TYLENOL Take 2 tablets (650 mg total) by mouth every 6 (six) hours as needed for moderate pain or fever.   allopurinol 100 MG tablet Commonly known as: ZYLOPRIM Take 100 mg by mouth daily.   amLODipine 10 MG tablet Commonly known as: NORVASC Take 1 tablet (10 mg total) by mouth daily. Start taking on: January 22, 2020   amoxicillin-clavulanate 875-125 MG tablet Commonly known as: AUGMENTIN Take 1 tablet by mouth every 12 (twelve) hours for 5 days.   busPIRone 15 MG tablet Commonly known as: BUSPAR Take 30 mg by mouth 2 (two) times daily.   Calcium Carbonate-Vitamin D 600-400 MG-UNIT tablet Take 1 tablet by mouth daily.   Compressor/Nebulizer Misc Nebulizer with supplies, trach collar for med admin.   escitalopram 20 MG tablet Commonly known as: LEXAPRO Take 20 mg by mouth daily. (take with 10mg  tablet to equal 30mg  total daily)   escitalopram 10 MG tablet Commonly known as: LEXAPRO Take 10 mg by mouth daily. (take with 20mg  tablet to equal 30mg  total daily)   feeding supplement Liqd Take 237 mLs by mouth 3 (three) times daily between meals.   fluconazole 150 MG tablet Commonly known as: DIFLUCAN Take  150 mg by mouth 2 (two) times a week.   folic acid 1 MG tablet Commonly known as: FOLVITE Take 1 mg by mouth daily.   gabapentin 600 MG tablet Commonly known as: NEURONTIN Take 600 mg by mouth 3 (three) times daily.   lovastatin 20 MG tablet Commonly known as: MEVACOR Take 20 mg by mouth at bedtime.   melatonin 5 MG Tabs Take 1 tablet (5 mg total) by mouth at bedtime.   meloxicam 15 MG tablet Commonly known as: MOBIC Take 15 mg by mouth daily.   metoprolol tartrate 25 MG tablet Commonly known as: LOPRESSOR Take  1 tablet (25 mg total) by mouth 2 (two) times daily. What changed: when to take this   pantoprazole 40 MG tablet Commonly known as: PROTONIX Take 40 mg by mouth daily.   rOPINIRole 0.5 MG tablet Commonly known as: REQUIP Take 0.5 mg by mouth at bedtime.   scopolamine 1 MG/3DAYS Commonly known as: TRANSDERM-SCOP Place 1 patch (1.5 mg total) onto the skin every 3 (three) days. Start taking on: January 24, 2020   sodium chloride 0.9 % nebulizer solution Take 3 mLs by nebulization as needed for wheezing.       If you experience worsening of your admission symptoms, develop shortness of breath, life threatening emergency, suicidal or homicidal thoughts you must seek medical attention immediately by calling 911 or calling your MD immediately  if symptoms less severe.  You Must read complete instructions/literature along with all the possible adverse reactions/side effects for all the Medicines you take and that have been prescribed to you. Take any new Medicines after you have completely understood and accept all the possible adverse reactions/side effects.   Please note  You were cared for by a hospitalist during your hospital stay. If you have any questions about your discharge medications or the care you received while you were in the hospital after you are discharged, you can call the unit and asked to speak with the hospitalist on call if the hospitalist that took care of you is not available. Once you are discharged, your primary care physician will handle any further medical issues. Please note that NO REFILLS for any discharge medications will be authorized once you are discharged, as it is imperative that you return to your primary care physician (or establish a relationship with a primary care physician if you do not have one) for your aftercare needs so that they can reassess your need for medications and monitor your lab values. Today   SUBJECTIVE   A bit sleepy today but  answers all questions appropriately when woken up.  VITAL SIGNS:  Blood pressure (!) 175/74, pulse 68, temperature 98.3 F (36.8 C), resp. rate 16, height 5\' 4"  (1.626 m), weight 72 kg, SpO2 99 %.  I/O:    Intake/Output Summary (Last 24 hours) at 01/21/2020 1424 Last data filed at 01/21/2020 1250 Gross per 24 hour  Intake 150.26 ml  Output 1550 ml  Net -1399.74 ml    PHYSICAL EXAMINATION:   GENERAL:  79 y.o.-year-old patient lying in the bed with no acute distress.  NECK:  Supple, no jugular venous distention. No thyroid enlargement, no tenderness. Tracheostomy+ LUNGS: Normal breath sounds bilaterally, no wheezing, rales, rhonchi. No use of accessory muscles of respiration.  CARDIOVASCULAR: S1, S2 normal. No murmurs, rubs, or gallops.  ABDOMEN: Soft, nontender, nondistended. Bowel sounds present. No organomegaly or mass.  EXTREMITIES: No cyanosis, clubbing or edema b/l.    NEUROLOGIC: Cranial nerves II  through XII are intact. No focal Motor or sensory deficits b/l.  weak PSYCHIATRIC:  patient is alert and oriented x 2 SKIN: No obvious rash, lesion, or ulcer. Per RN  DATA REVIEW:   CBC  Recent Labs  Lab 01/20/20 0719  WBC 6.7  HGB 10.9*  HCT 34.5*  PLT 171    Chemistries  Recent Labs  Lab 01/15/20 1343 01/16/20 0512 01/20/20 0719  NA 138   < > 143  K 4.4   < > 3.0*  CL 104   < > 107  CO2 23   < > 23  GLUCOSE 183*   < > 116*  BUN 20   < > 9  CREATININE 0.89   < > 0.71  CALCIUM 9.4   < > 7.9*  MG  --    < > 1.8  AST 26  --   --   ALT 21  --   --   ALKPHOS 62  --   --   BILITOT 0.6  --   --    < > = values in this interval not displayed.    Microbiology Results   Recent Results (from the past 240 hour(s))  Resp Panel by RT-PCR (Flu A&B, Covid) Nasopharyngeal Swab     Status: None   Collection Time: 01/15/20  5:11 PM   Specimen: Nasopharyngeal Swab; Nasopharyngeal(NP) swabs in vial transport medium  Result Value Ref Range Status   SARS Coronavirus 2  by RT PCR NEGATIVE NEGATIVE Final    Comment: (NOTE) SARS-CoV-2 target nucleic acids are NOT DETECTED.  The SARS-CoV-2 RNA is generally detectable in upper respiratory specimens during the acute phase of infection. The lowest concentration of SARS-CoV-2 viral copies this assay can detect is 138 copies/mL. A negative result does not preclude SARS-Cov-2 infection and should not be used as the sole basis for treatment or other patient management decisions. A negative result may occur with  improper specimen collection/handling, submission of specimen other than nasopharyngeal swab, presence of viral mutation(s) within the areas targeted by this assay, and inadequate number of viral copies(<138 copies/mL). A negative result must be combined with clinical observations, patient history, and epidemiological information. The expected result is Negative.  Fact Sheet for Patients:  EntrepreneurPulse.com.au  Fact Sheet for Healthcare Providers:  IncredibleEmployment.be  This test is no t yet approved or cleared by the [place] FDA and  has been authorized for detection and/or diagnosis of SARS-CoV-2 by FDA under an Emergency Use Authorization (EUA). This EUA will remain  in effect (meaning this test can be used) for the duration of the COVID-19 declaration under Section 564(b)(1) of the Act, 21 U.S.C.section 360bbb-3(b)(1), unless the authorization is terminated  or revoked sooner.       Influenza A by PCR NEGATIVE NEGATIVE Final   Influenza B by PCR NEGATIVE NEGATIVE Final    Comment: (NOTE) The Xpert Xpress SARS-CoV-2/FLU/RSV plus assay is intended as an aid in the diagnosis of influenza from Nasopharyngeal swab specimens and should not be used as a sole basis for treatment. Nasal washings and aspirates are unacceptable for Xpert Xpress SARS-CoV-2/FLU/RSV testing.  Fact Sheet for Patients: EntrepreneurPulse.com.au  Fact  Sheet for Healthcare Providers: IncredibleEmployment.be  This test is not yet approved or cleared by the [place] FDA and has been authorized for detection and/or diagnosis of SARS-CoV-2 by FDA under an Emergency Use Authorization (EUA). This EUA will remain in effect (meaning this test can be used) for the duration of the COVID-19  declaration under Section 564(b)(1) of the Act, 21 U.S.C. section 360bbb-3(b)(1), unless the authorization is terminated or revoked.  Performed at [facility], [address] [place], [place] [address]   Culture, blood (single) w Reflex to ID Panel     Status: None (Preliminary result)   Collection Time: 01/17/20 12:48 PM   Specimen: BLOOD  Result Value Ref Range Status   Specimen Description BLOOD BLLA  Final   Special Requests   Final    BOTTLES DRAWN AEROBIC AND ANAEROBIC Blood Culture adequate volume   Culture   Final    NO GROWTH 4 DAYS Performed at [facility], [address] [place], [place] [address]    Report Status PENDING  Incomplete  Expectorated sputum assessment w rflx to resp cult     Status: None   Collection Time: 01/17/20  3:26 PM   Specimen: Sputum  Result Value Ref Range Status   Specimen Description   Final    SPUTUM Performed at [facility], [address], [place], [place] [address]    Special Requests   Final    NONE Performed at [facility], [address], [place], [place] [address]    Sputum evaluation   Final    THIS SPECIMEN IS ACCEPTABLE FOR SPUTUM CULTURE Performed at [facility], [place] [address], [place], [place] [address]    Report Status 01/17/2020 FINAL  Final  Culture, respiratory     Status: None   Collection Time: 01/17/20  3:26 PM   Specimen: SPU  Result Value Ref Range Status   Specimen Description SPUTUM  Final   Special Requests NONE Reflexed from [id number]  Final   Gram Stain   Final    MODERATE WBC PRESENT, PREDOMINANTLY  PMN FEW GRAM POSITIVE RODS FEW GRAM NEGATIVE RODS FEW GRAM POSITIVE COCCI IN PAIRS IN CLUSTERS    Culture   Final    Normal respiratory flora-no Staph aureus or Pseudomonas seen Performed at [facility], [address] [place],  [address]    Report Status 01/19/2020 FINAL  Final  MRSA PCR Screening     Status: None   Collection Time: 01/17/20  5:00 PM   Specimen: Nasopharyngeal  Result Value Ref Range Status   MRSA by PCR NEGATIVE NEGATIVE Final    Comment:        The GeneXpert MRSA Assay (FDA approved for NASAL specimens only), is one component of a comprehensive MRSA colonization surveillance program. It is not intended to diagnose MRSA infection nor to guide or monitor treatment for MRSA infections. Performed at [facility], [address] [place],  [address]     RADIOLOGY:  [place] Swallowing Func-Speech Pathology  Result Date: 01/19/2020 Objective Swallowing Evaluation: Type of Study: MBS-Modified Barium Swallow Study  Patient Details Name: Angela David MRN: [id number] Date of Birth: 17-Jul-1940 Today's Date: 01/19/2020 Time: SLP Start Time (ACUTE ONLY): 1420 -SLP Stop Time (ACUTE ONLY): 1445 SLP Time Calculation (min) (ACUTE ONLY): 25 min Past Medical History: Past Medical History: Diagnosis Date . Breast cancer ([place]) 2002  right breast chemo and radiation . Cancer [facility]) 2002  right breast . Diabetes mellitus without complication ([place])  . Diabetic peripheral neuropathy ([place]) 08/28/2016 . GERD (gastroesophageal reflux disease)  . Glaucoma  . High cholesterol  . History of colon polyps  . Peripheral neuropathy 08/28/2016 . Personal history of chemotherapy 2002  BREAST CA . Personal history of malignant neoplasm of breast  .  Personal history of radiation therapy 2002  BREAST CA . Vocal cord paralysis 2012 Past Surgical History: Past Surgical History: Procedure Laterality Date . ABDOMINAL HYSTERECTOMY   . BREAST BIOPSY Right 2008  neg . BREAST  EXCISIONAL BIOPSY Right 2002  positive . BREAST LUMPECTOMY Right 2002  chemo and radiation . BREAST SURGERY Right 2002  LUMPECTOMY, radiation, chemotherapy . CHOLECYSTECTOMY N/A 12/10/2017  Procedure: LAPAROSCOPIC CHOLECYSTECTOMY;  Surgeon: Benjamine Sprague, DO;  Location: [facility] ORS;  Service: General;  Laterality: N/A; . COLON RESECTION Right 11/06/2018  Procedure: HAND ASSISTED LAPAROSCOPIC COLON RESECTION, RIGHT;  Surgeon: Jules Husbands, MD;  Location: [facility] ORS;  Service: General;  Laterality: Right; . COLONOSCOPY  2009,12/30/2013 . COLONOSCOPY WITH PROPOFOL N/A 04/16/2018  Tubulovillous polyp proximal transverse colon, large sessile polyp, proximal transverse colon;  Surgeon: Robert Bellow, MD;  Location: [facility] ENDOSCOPY;  Service: Endoscopy;  Laterality: N/A; . ESOPHAGOGASTRODUODENOSCOPY (EGD) WITH PROPOFOL N/A 10/14/2018  Procedure: ESOPHAGOGASTRODUODENOSCOPY (EGD) WITH PROPOFOL;  Surgeon: Lucilla Lame, MD;  Location: [facility] ENDOSCOPY;  Service: Endoscopy;  Laterality: N/A; . HYDROCELE EXCISION / REPAIR   . TRACHEAL SURGERY  2013 . TRACHEOSTOMY N/A   Dr Kathyrn Sheriff HPI: Karleen [place] is a 79 y.o. female with medical history significant for hypertension, diabetes, history of trachea stenosis status post tracheostomy who presented to the ED on 01/15/20 with mental status changes with disorientation, not recognizing family, generalized weakness for several days.  Pt has been chronically trach dependent since 2013 and is followed by [facility] and Anda Latina (ENT, [facility]). Pertinant information: May 31, 2019: MBSS at [facility] - "Pt presents with a mild oral and mild to moderate pharyngeal dysphagia c/b reduced oral manipulation, delayed swallow initiation, reduced BOT retraction and reduced pharyngeal constriction. These led to increased oral transit time, vallecula residue (mild - thin liquids, moderate - solids), pyriform sinus residue (mild - thin liquids, moderate - solids and trace penetration of  solids. Penetration cleared with swallows. Residue significantly reduced with liquid rinse and subsequent swallows. Cervical esophagus: accumulation of solids within the UES/cervical esophagus essentially cleared with liquid rinse and subsequent swallows." June 24, 2019: The patient underwent MDL with excision of suprastomal granulation tissue and tracheal balloon dilation, and esophagoscopy with UES balloon dilation. Currently pt has a fenestrated #6 [place] with inability to tolerate inner cannula and PMSV but per her husband she can achieve phonation with finger occulsion. Per husband's report, she has difficulty swallowing with it varying between moderate to severe.  Subjective: pt pleasant, alert, attentive to study Assessment / Plan / Recommendation CHL IP CLINICAL IMPRESSIONS 01/19/2020 Clinical Impression Imaging is difficult d/t pt's poor position. Despite having pillows and support to sit upright pt leaned to her right with her head laying to her left. Pt was positioned in an oblique position for best imagaing. Pt doesn't occlud her trach when eating. Trials were given with trach unoccluded and with SLP finger occluding trach hub. No change is oropharyngeal abilities were observed. Pt has a very complex tracheal and esophageal history. This Modified Barium Swallow was provided to assess for aspiration in an effort to return pt to an oral diet. Per attending report, pt's otolaryngologist doesn't have any further recommendations for trach management and her current ENT requested her trach to be left "as it is." Pt's HR continues to be elevated at ~ 100 but MD granted permission to have swallow study and pt's nurse was present during the study for close monitoring. Pt on 5L trach collar. Aspiration was not observed  during the study but again clear visualization was difficult to achieve d/t pt positioning. Pt is not able to perform any compensatory swallow strategies.  Pt presents with severe sensori-motor  oropharyngeal dysphagia. Pt's oral phase is c/b decreased lingual manipulation of puree, mechanical soft, thin liquids and nectar thick liquids. Pt utilizes lingual pumping to help with posterior propulsion of boluses. Pt has premature spillage of all boluses into her pyriform sinuses and piecemeal swallowing. Her pharyngeal phase is characterized by extremely delayed swallow initiation, decreased pharyneal squeeze and limited amounts of bolus transiting through the UES with each swallow. As a result, > 90% of each bolus residues in the pharynx that is only cleared thru the UES after 5 to 6 swallows. At baseline, pt consumes her medicine whole in puree. During this study pt cleared the puree thru her UES but the barium tablet resides in the cervical esophagus and required liquid wash and multiple swallows to clear. When completing multiple swallows per bolus, pt's HR climbed to 116 and to 118. Pt is at an extremely high risk of aspirating all PO intake as well as having a cardiac response to multiple swallows. A conservative diet would be dysphagia 1 with thin liquids via cup, medicine crushed in puree. Also recommend a consult for Palliative Care to help with GOC.  SLP Visit Diagnosis Dysphagia, oropharyngeal phase (R13.12);Dysphagia, pharyngoesophageal phase (R13.14) Attention and concentration deficit following -- Frontal lobe and executive function deficit following -- Impact on safety and function Severe aspiration risk;Risk for inadequate nutrition/hydration   CHL IP TREATMENT RECOMMENDATION 01/19/2020 Treatment Recommendations No treatment recommended at this time   Prognosis 01/19/2020 Prognosis for Safe Diet Advancement Guarded Barriers to Reach Goals Severity of deficits;Time post onset Barriers/Prognosis Comment -- CHL IP DIET RECOMMENDATION 01/19/2020 SLP Diet Recommendations Dysphagia 1 (Puree) solids;Thin liquid Liquid Administration via Cup;No straw Medication Administration Whole meds with puree  Compensations Minimize environmental distractions;Slow rate;Small sips/bites Postural Changes Seated upright at 90 degrees   CHL IP OTHER RECOMMENDATIONS 01/19/2020 Recommended Consults (No Data) Oral Care Recommendations Oral care BID Other Recommendations --   CHL IP FOLLOW UP RECOMMENDATIONS 01/19/2020 Follow up Recommendations (No Data)   CHL IP FREQUENCY AND DURATION 01/18/2020 Speech Therapy Frequency (ACUTE ONLY) min 2x/week Treatment Duration 2 weeks      CHL IP ORAL PHASE 01/19/2020 Oral Phase Impaired Oral - Pudding Teaspoon -- Oral - Pudding Cup -- Oral - Honey Teaspoon -- Oral - Honey Cup -- Oral - Nectar Teaspoon Weak lingual manipulation;Lingual pumping;Reduced posterior propulsion;Holding of bolus;Lingual/palatal residue;Piecemeal swallowing;Delayed oral transit;Decreased bolus cohesion;Premature spillage Oral - Nectar Cup Weak lingual manipulation;Lingual pumping;Reduced posterior propulsion;Holding of bolus;Lingual/palatal residue;Piecemeal swallowing;Delayed oral transit;Decreased bolus cohesion;Premature spillage Oral - Nectar Straw -- Oral - Thin Teaspoon Weak lingual manipulation;Lingual pumping;Reduced posterior propulsion;Holding of bolus;Lingual/palatal residue;Piecemeal swallowing;Delayed oral transit;Decreased bolus cohesion;Premature spillage Oral - Thin Cup Weak lingual manipulation;Lingual pumping;Reduced posterior propulsion;Holding of bolus;Lingual/palatal residue;Piecemeal swallowing;Delayed oral transit;Decreased bolus cohesion;Premature spillage Oral - Thin Straw -- Oral - Puree Weak lingual manipulation;Lingual pumping;Reduced posterior propulsion;Holding of bolus;Lingual/palatal residue;Piecemeal swallowing;Delayed oral transit;Decreased bolus cohesion;Premature spillage Oral - Mech Soft Impaired mastication;Weak lingual manipulation;Lingual pumping;Reduced posterior propulsion;Holding of bolus;Piecemeal swallowing;Delayed oral transit;Decreased bolus cohesion;Lingual/palatal  residue Oral - Regular -- Oral - Multi-Consistency -- Oral - Pill Weak lingual manipulation;Lingual pumping;Reduced posterior propulsion;Holding of bolus;Lingual/palatal residue;Piecemeal swallowing;Delayed oral transit;Decreased bolus cohesion;Premature spillage Oral Phase - Comment --  CHL IP PHARYNGEAL PHASE 01/19/2020 Pharyngeal Phase Impaired Pharyngeal- Pudding Teaspoon -- Pharyngeal -- Pharyngeal- Pudding Cup -- Pharyngeal -- Pharyngeal- Honey Teaspoon --  Pharyngeal -- Pharyngeal- Honey Cup -- Pharyngeal -- Pharyngeal- Nectar Teaspoon Delayed swallow initiation-pyriform sinuses;Reduced pharyngeal peristalsis;Reduced epiglottic inversion;Reduced anterior laryngeal mobility;Reduced laryngeal elevation;Reduced tongue base retraction;Pharyngeal residue - valleculae;Pharyngeal residue - pyriform;Pharyngeal residue - posterior pharnyx;Pharyngeal residue - cp segment;Lateral channel residue;Inter-arytenoid space residue Pharyngeal -- Pharyngeal- Nectar Cup Delayed swallow initiation-pyriform sinuses;Reduced pharyngeal peristalsis;Reduced epiglottic inversion;Reduced anterior laryngeal mobility;Reduced laryngeal elevation;Reduced tongue base retraction;Pharyngeal residue - pyriform;Pharyngeal residue - posterior pharnyx;Pharyngeal residue - cp segment;Pharyngeal residue - valleculae;Inter-arytenoid space residue;Lateral channel residue Pharyngeal -- Pharyngeal- Nectar Straw -- Pharyngeal -- Pharyngeal- Thin Teaspoon Delayed swallow initiation-pyriform sinuses;Reduced pharyngeal peristalsis;Reduced epiglottic inversion;Reduced anterior laryngeal mobility;Reduced laryngeal elevation;Reduced tongue base retraction;Pharyngeal residue - valleculae;Pharyngeal residue - pyriform;Pharyngeal residue - posterior pharnyx;Pharyngeal residue - cp segment;Inter-arytenoid space residue;Lateral channel residue Pharyngeal -- Pharyngeal- Thin Cup Delayed swallow initiation-pyriform sinuses;Reduced pharyngeal peristalsis;Reduced  epiglottic inversion;Reduced anterior laryngeal mobility;Reduced laryngeal elevation;Reduced tongue base retraction;Pharyngeal residue - valleculae;Pharyngeal residue - pyriform;Pharyngeal residue - posterior pharnyx;Pharyngeal residue - cp segment;Inter-arytenoid space residue;Lateral channel residue Pharyngeal -- Pharyngeal- Thin Straw -- Pharyngeal -- Pharyngeal- Puree Delayed swallow initiation-vallecula;Delayed swallow initiation-pyriform sinuses;Reduced pharyngeal peristalsis;Reduced anterior laryngeal mobility;Reduced laryngeal elevation;Reduced tongue base retraction;Pharyngeal residue - valleculae;Pharyngeal residue - pyriform;Pharyngeal residue - posterior pharnyx;Pharyngeal residue - cp segment;Inter-arytenoid space residue;Lateral channel residue Pharyngeal -- Pharyngeal- Mechanical Soft Delayed swallow initiation-pyriform sinuses;Reduced pharyngeal peristalsis;Reduced epiglottic inversion;Reduced anterior laryngeal mobility;Reduced laryngeal elevation;Reduced tongue base retraction;Pharyngeal residue - valleculae;Pharyngeal residue - pyriform;Pharyngeal residue - posterior pharnyx;Pharyngeal residue - cp segment;Inter-arytenoid space residue;Lateral channel residue Pharyngeal -- Pharyngeal- Regular -- Pharyngeal -- Pharyngeal- Multi-consistency -- Pharyngeal -- Pharyngeal- Pill Delayed swallow initiation-pyriform sinuses;Reduced pharyngeal peristalsis;Reduced anterior laryngeal mobility;Reduced laryngeal elevation;Reduced tongue base retraction;Pharyngeal residue - valleculae;Pharyngeal residue - pyriform;Pharyngeal residue - posterior pharnyx;Pharyngeal residue - cp segment;Inter-arytenoid space residue Pharyngeal -- Pharyngeal Comment --  CHL IP CERVICAL ESOPHAGEAL PHASE 01/19/2020 Cervical Esophageal Phase Impaired Pudding Teaspoon -- Pudding Cup -- Honey Teaspoon -- Honey Cup -- Nectar Teaspoon -- Nectar Cup -- Nectar Straw -- Thin Teaspoon -- Thin Cup -- Thin Straw -- Puree -- Mechanical Soft --  Regular -- Multi-consistency -- Pill (No Data) Cervical Esophageal Comment -- Angela David M.S., CCC-SLP, [place] Office [phone] Angela Rutherford David 01/19/2020, 8:00 PM                CODE STATUS:     Code Status Orders  (From admission, onward)         Start     Ordered   01/15/20 2237  Full code  Continuous        01/15/20 2239        Code Status History    Date Active Date Inactive Code Status Order ID Comments User Context   03/21/2019 0108 04/01/2019 2227 Full Code [id number]  Awilda Bill, NP ED   12/26/2018 0336 12/31/2018 2219 Full Code [id number]  Tylene Fantasia, PA-C ED   11/06/2018 1233 11/09/2018 1917 Full Code [id number]  Jules Husbands, MD Inpatient   Advance Care Planning Activity       TOTAL TIME TAKING CARE OF THIS PATIENT: *35* minutes.    Fritzi Mandes M.D  [facility]    CC: Primary care physician; Iva Boop, MD

## 2020-01-21 NOTE — Progress Notes (Signed)
PT Cancellation Note  Patient Details Name: Angela David MRN: [id number] DOB: 1940/05/28   Cancelled Treatment:    Reason Eval/Treat Not Completed: Other (comment).  PT consult received.  Chart reviewed.  Pt resting in bed upon PT arrival.  Attempted to initiate PT evaluation but pt appearing tired and drowsy.  Attempted to have pt participate in therapy but pt reporting being too tired to participate.  Therapist attempted to encourage pt again to participate but pt then appearing firm and reporting that she knew her body and that based on how she was feeling, she would not do a good job and pt refused physical therapy session today (pt reports that she will be here for at least one more day and would try therapy tomorrow).  Pt reporting being concerned regarding her CO2 levels and needing it checked.  Nurse notified regarding attempted session and pt's request for CO2 testing (nurse reported she would notify MD).  Will re-attempt PT re-evaluation at a later date/time.  Leitha Bleak, PT 01/21/20, 12:28 PM

## 2020-01-21 NOTE — Discharge Instructions (Signed)
Recommended Consults:  (Palliative Care)   SLP Diet Recommendations: Dysphagia 1 (Puree) solids;Thin liquid   Liquid Administration via: Cup;No straw   Medication Administration: Whole meds with puree   Supervision: Staff to assist with self feeding;Full supervision/cueing for compensatory strategies   Compensations: Minimize environmental distractions;Slow rate;Small sips/bites   Postural Changes: Seated upright at 90 degrees   Oral Care Recommendations: Oral care BID  Resume out pt PT

## 2020-01-21 NOTE — Progress Notes (Signed)
Patient is stable and ready for discharge home. Patient's IVs removed. Writer went over discharge paperwork with patient and husband and both verbalized understanding of information provided and all questions answered. Patient's was dressed by her husband and he packed her belongings and took to the car. Patient's sat 95-98% on trach collar. Patient was transferred via [facility] to her husbands car for home discharge.

## 2020-01-21 NOTE — TOC Initial Note (Signed)
Transition of Care [facility]) - Initial/Assessment Note    Patient Details  Name: Angela David MRN: [id number] Date of Birth: 02/24/1941  Transition of [facility]) CM/SW Contact:    Shelbie Ammons, RN Phone Number: 01/21/2020, 9:11 AM  Clinical Narrative:   RNCM met with patient at bedside. Patient is from home with her husband. Discussed with patient that plan is for her to likely discharge today. Patient reports that it will be her plan to return home, that she is already set up with [facility] outpatient therapy and is very pleased with the care she gets from them. RNCM discussed that PT will be coming around to see her later this morning and that previous recommendation was for home health. Patient verbalizes understanding but reports that she is actually moving around better this hospitalization than she has in the past.                 Expected Discharge Plan: OP Rehab Barriers to Discharge: No Barriers Identified   Patient Goals and CMS Choice        Expected Discharge Plan and Services Expected Discharge Plan: OP Rehab       Living arrangements for the past 2 months: Single Family Home                                      Prior Living Arrangements/Services Living arrangements for the past 2 months: Single Family Home Lives with:: Spouse Patient language and need for interpreter reviewed:: Yes Do you feel safe going back to the place where you live?: Yes      Need for Family Participation in Patient Care: Yes (Comment) Care giver support system in place?: Yes (comment)   Criminal Activity/Legal Involvement Pertinent to Current Situation/Hospitalization: No - Comment as needed  Activities of Daily Living Home Assistive Devices/Equipment: Eyeglasses, CBG Meter ADL Screening (condition at time of admission) Patient's cognitive ability adequate to safely complete daily activities?: Yes Is the patient deaf or have difficulty hearing?: No Does the patient have  difficulty seeing, even when wearing glasses/contacts?: No Does the patient have difficulty concentrating, remembering, or making decisions?: No Patient able to express need for assistance with ADLs?: Yes Does the patient have difficulty dressing or bathing?: No Independently performs ADLs?: Yes (appropriate for developmental age) Does the patient have difficulty walking or climbing stairs?: No Weakness of Legs: Both Weakness of Arms/Hands: None  Permission Sought/Granted                  Emotional Assessment Appearance:: Appears stated age Attitude/Demeanor/Rapport: Engaged Affect (typically observed): Appropriate, Calm Orientation: : Oriented to Self, Oriented to Place, Oriented to  Time, Oriented to Situation Alcohol / Substance Use: Not Applicable Psych Involvement: No (comment)  Admission diagnosis:  Hallucination [R44.3] Altered mental status [R41.82] Difficulty walking [R26.2] Swelling of joint of lower leg [M25.469] Acute congestive heart failure, unspecified heart failure type ([place]) [I50.9] Swelling of lower leg [M79.89] Atrial fibrillation with RVR ([place]) [I48.91] Patient Active Problem List   Diagnosis Date Noted  . Left lower lobe pneumonia 01/16/2020  . 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 Drug-induced polyneuropathy ([place]) 05/05/2019  . 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[place]) 05/05/2019  . Weakness   . Hypoxia   . Acute respiratory failure with hypoxia and hypercapnia ([place]) 03/21/2019  . Hypotension 03/21/2019  . Acute encephalopathy 03/21/2019  . Multiple fractures of ribs, right side, initial  encounter for closed fracture 12/25/2018  . Colon polyp 11/06/2018  . Esophageal dysphagia   . 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 Stricture and stenosis of esophagus   . Elevated CEA 09/02/2018  . Goals of care, counseling/discussion 09/02/2018  . Polyp of transverse colon 09/02/2018  . Foreign body in right foot 05/26/2018  . History of  tracheostomy 04/30/2018  . Moderate episode of recurrent major depressive disorder ([place]) 04/03/2018  . Poor balance 04/03/2018  . Shoulder lesion, right 02/28/2017  . Cervical radiculopathy 09/28/2016  . Peripheral neuropathy 08/28/2016  . Diabetic peripheral neuropathy ([place]) 08/28/2016  . Leg weakness, bilateral 08/13/2016  . Gait abnormality 08/13/2016  . Carcinoma of overlapping sites of right breast in female, estrogen receptor negative ([place]) 12/27/2015  . Malignant neoplasm of breast ([place]) 03/28/2015  . 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 Hypercholesterolemia 03/28/2015  . Recurrent major depressive disorder, in partial remission ([place]) 03/01/2015  . 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 Cephalalgia 09/15/2014  . Headache due to trauma 09/15/2014  . Osteopenia 01/07/2014  . Age-related osteoporosis without current pathological fracture 01/07/2014  . History of colonic polyps 12/14/2013  . History of breast cancer 11/26/2012  . H/O malignant neoplasm of breast 11/26/2012  . 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 Tracheostomy present ([place]) 12/06/2011  . Calcification of bronchus or trachea 01/16/2011  . 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 Anxiety state 12/19/2010  . Blood in sputum 12/19/2010  . HLD (hyperlipidemia) 12/19/2010  . Type 2 diabetes mellitus ([place]) 12/19/2010   PCP:  Iva Boop, MD Pharmacy:   [facility], [address] [place] [address] Phone: [phone] Fax: [phone]  [facility] [place], [place] - [address] [place] [address] Phone: [phone] Fax: [phone]     Social Determinants of Health (SDOH) Interventions    Readmission Risk Interventions No flowsheet data found.

## 2020-01-21 NOTE — Progress Notes (Signed)
Daily Progress Note   Patient Name: Angela David       Date: 01/21/2020 DOB: 11-Nov-1940  Age: 79 y.o. MRN#: [id number] Attending Physician: Fritzi Mandes, MD Primary Care Physician: Iva Boop, MD Admit Date: 01/15/2020  Reason for Consultation/Follow-up: Establishing goals of care  Subjective: Patient is resting in bed on trach collar. She states she slept well last night but is still sleepy. We discussed her sister who died around 7 weeks ago. She states her sister loved life. She was admitted to the hospital and then went into renal failure. She states she was told by a provider she needed to stop everything and just let her go. She states she told the doctor "I can't and I won't do that." She was very upset by that. She states she does not like to think about death and never has. She states she is a woman of faith; she does not want to leave her family here on earth. She states there are 2/5 siblings living (including her self). She discusses how they died, as well as her parents.   She states she understands she needs to think about the what if's, and her family would need to know what to do if she cannot make decisions, but she is unsure of boundaries. She states she does not believe she would want to be kept alive indefinitely on a ventilator, but is unsure of the amount of time to provide to make the decision. She tells me she understands people define living and existing differently. She understands people define vegetative state differently, and what amount of time to be given to make that decision. Discussed quality of life. We discussed potential offering of a feeding tube in the future. She states she does not like her current diet, but will continue it if needed to keep  her alive. She states " I don't like having to be in the hospital so much" but will if it keeps her alive longer.   Length of Stay: 5  Current Medications: Scheduled Meds:  . allopurinol  100 mg Oral Daily  . amLODipine  10 mg Oral Daily  . amoxicillin-clavulanate  1 tablet Oral Q12H  . busPIRone  30 mg Oral BID  . calcium-vitamin D  1 tablet Oral Daily  . Chlorhexidine Gluconate Cloth  6  each Topical Daily  . enoxaparin (LOVENOX) injection  40 mg Subcutaneous Q24H  . escitalopram  30 mg Oral Daily  . feeding supplement  237 mL Oral TID BM  . fluconazole  150 mg Oral Once per day on Mon Thu  . gabapentin  600 mg Oral TID  . melatonin  5 mg Oral QHS  . metoprolol tartrate  25 mg Oral BID  . pneumococcal 23 valent vaccine  0.5 mL Intramuscular Tomorrow-1000  . pravastatin  10 mg Oral q1800  . rOPINIRole  0.5 mg Oral QHS  . scopolamine  1 patch Transdermal Q72H    Continuous Infusions:   PRN Meds: acetaminophen, diclofenac Sodium, labetalol, meclizine, polyvinyl alcohol  Physical Exam Pulmonary:     Effort: Pulmonary effort is normal.  Neurological:     Mental Status: She is alert.             Vital Signs: BP (!) 196/98 (BP Location: Left Arm)   Pulse 87   Temp 98.1 F (36.7 C)   Resp 16   Ht 5\' 4"  (1.626 m)   Wt 72 kg   SpO2 95%   BMI 27.25 kg/m  SpO2: SpO2: 95 % O2 Device: O2 Device: Room Air O2 Flow Rate: O2 Flow Rate (L/min): 5 L/min  Intake/output summary:   Intake/Output Summary (Last 24 hours) at 01/21/2020 1022 Last data filed at 01/21/2020 0430 Gross per 24 hour  Intake 150.26 ml  Output 1300 ml  Net -1149.74 ml   LBM: Last BM Date: 01/17/20 Baseline Weight: Weight: 70.3 kg Most recent weight: Weight: 72 kg       Palliative Assessment/Data: 40%      Patient Active Problem List   Diagnosis Date Noted  . Left lower lobe pneumonia 01/16/2020  . 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 Drug-induced polyneuropathy ([place]) 05/05/2019  .  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[place]) 05/05/2019  . Weakness   . Hypoxia   . Acute respiratory failure with hypoxia and hypercapnia ([place]) 03/21/2019  . Hypotension 03/21/2019  . Acute encephalopathy 03/21/2019  . Multiple fractures of ribs, right side, initial encounter for closed fracture 12/25/2018  . Colon polyp 11/06/2018  . Esophageal dysphagia   . 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 Stricture and stenosis of esophagus   . Elevated CEA 09/02/2018  . Goals of care, counseling/discussion 09/02/2018  . Polyp of transverse colon 09/02/2018  . Foreign body in right foot 05/26/2018  . History of tracheostomy 04/30/2018  . Moderate episode of recurrent major depressive disorder ([place]) 04/03/2018  . Poor balance 04/03/2018  . Shoulder lesion, right 02/28/2017  . Cervical radiculopathy 09/28/2016  . Peripheral neuropathy 08/28/2016  . Diabetic peripheral neuropathy ([place]) 08/28/2016  . Leg weakness, bilateral 08/13/2016  . Gait abnormality 08/13/2016  . Carcinoma of overlapping sites of right breast in female, estrogen receptor negative ([place]) 12/27/2015  . Malignant neoplasm of breast ([place]) 03/28/2015  . 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 Hypercholesterolemia 03/28/2015  . Recurrent major depressive disorder, in partial remission ([place]) 03/01/2015  . 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 Cephalalgia 09/15/2014  . Headache due to trauma 09/15/2014  . Osteopenia 01/07/2014  . Age-related osteoporosis without current pathological fracture 01/07/2014  . History of colonic polyps 12/14/2013  . History of breast cancer 11/26/2012  . H/O malignant neoplasm of breast 11/26/2012  . 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 Tracheostomy present ([place]) 12/06/2011  . Calcification of bronchus or trachea 01/16/2011  . Benign neoplasm of  trachea 01/10/2011  . Anxiety state 12/19/2010  . Blood in sputum 12/19/2010  . HLD (hyperlipidemia) 12/19/2010  . Type 2  diabetes mellitus ([place]) 12/19/2010    Palliative Care Assessment & Plan    Recommendations/Plan:  Outpatient palliative.   Code Status:    Code Status Orders  (From admission, onward)         Start     Ordered   01/15/20 2237  Full code  Continuous        01/15/20 2239        Code Status History    Date Active Date Inactive Code Status Order ID Comments User Context   03/21/2019 0108 04/01/2019 2227 Full Code [id number]  Awilda Bill, NP ED   12/26/2018 0336 12/31/2018 2219 Full Code [id number]  Tylene Fantasia, PA-C ED   11/06/2018 1233 11/09/2018 1917 Full Code [id number]  Jules Husbands, MD Inpatient   Advance Care Planning Activity       Prognosis:   This depends on diet and aspiration risk.   Care plan was discussed with RN  Thank you for allowing the Palliative Medicine Team to assist in the care of this patient.   Total Time 35 min Prolonged Time Billed  no      Greater than 50%  of this time was spent counseling and coordinating care related to the above assessment and plan.  Asencion Gowda, NP  Please contact Palliative Medicine Team phone at [phone] for questions and concerns.

## 2020-01-22 LAB — CULTURE, BLOOD (SINGLE)
Culture: NO GROWTH
Special Requests: ADEQUATE

## 2020-01-23 ENCOUNTER — Other Ambulatory Visit: Payer: Self-pay

## 2020-01-23 ENCOUNTER — Emergency Department: Payer: Medicare Other

## 2020-01-23 ENCOUNTER — Inpatient Hospital Stay
Admission: EM | Admit: 2020-01-23 | Discharge: 2020-02-13 | DRG: 207 | Disposition: A | Discharge status: Skilled Nursing Facility | Payer: Medicare Other | Attending: Internal Medicine | Admitting: Internal Medicine

## 2020-01-23 DIAGNOSIS — G9341 Metabolic encephalopathy: Secondary | ICD-10-CM

## 2020-01-23 DIAGNOSIS — I480 Paroxysmal atrial fibrillation: Secondary | ICD-10-CM

## 2020-01-23 DIAGNOSIS — J9690 Respiratory failure, unspecified, unspecified whether with hypoxia or hypercapnia: Secondary | ICD-10-CM

## 2020-01-23 DIAGNOSIS — E1142 Type 2 diabetes mellitus with diabetic polyneuropathy: Secondary | ICD-10-CM | POA: Diagnosis present

## 2020-01-23 DIAGNOSIS — R000 Tachycardia, unspecified: Secondary | ICD-10-CM

## 2020-01-23 DIAGNOSIS — J9600 Acute respiratory failure, unspecified whether with hypoxia or hypercapnia: Secondary | ICD-10-CM | POA: Diagnosis present

## 2020-01-23 DIAGNOSIS — G928 Other toxic encephalopathy: Secondary | ICD-10-CM | POA: Diagnosis present

## 2020-01-23 DIAGNOSIS — Z930 Tracheostomy status: Secondary | ICD-10-CM

## 2020-01-23 DIAGNOSIS — E872 Acidosis: Secondary | ICD-10-CM | POA: Diagnosis present

## 2020-01-23 DIAGNOSIS — M6281 Muscle weakness (generalized): Secondary | ICD-10-CM | POA: Diagnosis present

## 2020-01-23 DIAGNOSIS — Z9221 Personal history of antineoplastic chemotherapy: Secondary | ICD-10-CM

## 2020-01-23 DIAGNOSIS — Z20822 Contact with and (suspected) exposure to covid-19: Secondary | ICD-10-CM | POA: Diagnosis present

## 2020-01-23 DIAGNOSIS — J9620 Acute and chronic respiratory failure, unspecified whether with hypoxia or hypercapnia: Secondary | ICD-10-CM | POA: Diagnosis not present

## 2020-01-23 DIAGNOSIS — R4182 Altered mental status, unspecified: Secondary | ICD-10-CM

## 2020-01-23 DIAGNOSIS — H409 Unspecified glaucoma: Secondary | ICD-10-CM | POA: Diagnosis present

## 2020-01-23 DIAGNOSIS — J9622 Acute and chronic respiratory failure with hypercapnia: Secondary | ICD-10-CM | POA: Diagnosis present

## 2020-01-23 DIAGNOSIS — J3800 Paralysis of vocal cords and larynx, unspecified: Secondary | ICD-10-CM | POA: Diagnosis present

## 2020-01-23 DIAGNOSIS — I5033 Acute on chronic diastolic (congestive) heart failure: Secondary | ICD-10-CM | POA: Diagnosis present

## 2020-01-23 DIAGNOSIS — K439 Ventral hernia without obstruction or gangrene: Secondary | ICD-10-CM | POA: Diagnosis present

## 2020-01-23 DIAGNOSIS — J698 Pneumonitis due to inhalation of other solids and liquids: Secondary | ICD-10-CM | POA: Diagnosis not present

## 2020-01-23 DIAGNOSIS — J9621 Acute and chronic respiratory failure with hypoxia: Secondary | ICD-10-CM | POA: Diagnosis present

## 2020-01-23 DIAGNOSIS — Z9102 Food additives allergy status: Secondary | ICD-10-CM

## 2020-01-23 DIAGNOSIS — Z91013 Allergy to seafood: Secondary | ICD-10-CM

## 2020-01-23 DIAGNOSIS — K219 Gastro-esophageal reflux disease without esophagitis: Secondary | ICD-10-CM | POA: Diagnosis present

## 2020-01-23 DIAGNOSIS — E785 Hyperlipidemia, unspecified: Secondary | ICD-10-CM | POA: Diagnosis present

## 2020-01-23 DIAGNOSIS — E1165 Type 2 diabetes mellitus with hyperglycemia: Secondary | ICD-10-CM

## 2020-01-23 DIAGNOSIS — J386 Stenosis of larynx: Secondary | ICD-10-CM | POA: Diagnosis not present

## 2020-01-23 DIAGNOSIS — Z8719 Personal history of other diseases of the digestive system: Secondary | ICD-10-CM

## 2020-01-23 DIAGNOSIS — J690 Pneumonitis due to inhalation of food and vomit: Principal | ICD-10-CM | POA: Diagnosis present

## 2020-01-23 DIAGNOSIS — R0602 Shortness of breath: Secondary | ICD-10-CM

## 2020-01-23 DIAGNOSIS — Z66 Do not resuscitate: Secondary | ICD-10-CM | POA: Diagnosis present

## 2020-01-23 DIAGNOSIS — E7800 Pure hypercholesterolemia, unspecified: Secondary | ICD-10-CM | POA: Diagnosis present

## 2020-01-23 DIAGNOSIS — Z9911 Dependence on respirator [ventilator] status: Secondary | ICD-10-CM

## 2020-01-23 DIAGNOSIS — J398 Other specified diseases of upper respiratory tract: Secondary | ICD-10-CM | POA: Diagnosis present

## 2020-01-23 DIAGNOSIS — T17908A Unspecified foreign body in respiratory tract, part unspecified causing other injury, initial encounter: Secondary | ICD-10-CM

## 2020-01-23 DIAGNOSIS — K660 Peritoneal adhesions (postprocedural) (postinfection): Secondary | ICD-10-CM | POA: Diagnosis present

## 2020-01-23 DIAGNOSIS — Z853 Personal history of malignant neoplasm of breast: Secondary | ICD-10-CM

## 2020-01-23 DIAGNOSIS — M7989 Other specified soft tissue disorders: Secondary | ICD-10-CM

## 2020-01-23 DIAGNOSIS — Z923 Personal history of irradiation: Secondary | ICD-10-CM

## 2020-01-23 DIAGNOSIS — Z515 Encounter for palliative care: Secondary | ICD-10-CM | POA: Diagnosis not present

## 2020-01-23 DIAGNOSIS — R0689 Other abnormalities of breathing: Secondary | ICD-10-CM

## 2020-01-23 DIAGNOSIS — Z0189 Encounter for other specified special examinations: Secondary | ICD-10-CM

## 2020-01-23 DIAGNOSIS — G603 Idiopathic progressive neuropathy: Secondary | ICD-10-CM | POA: Diagnosis not present

## 2020-01-23 DIAGNOSIS — I110 Hypertensive heart disease with heart failure: Secondary | ICD-10-CM | POA: Diagnosis present

## 2020-01-23 DIAGNOSIS — I959 Hypotension, unspecified: Secondary | ICD-10-CM | POA: Diagnosis present

## 2020-01-23 DIAGNOSIS — I5032 Chronic diastolic (congestive) heart failure: Secondary | ICD-10-CM | POA: Diagnosis not present

## 2020-01-23 DIAGNOSIS — J9601 Acute respiratory failure with hypoxia: Secondary | ICD-10-CM

## 2020-01-23 DIAGNOSIS — Z885 Allergy status to narcotic agent status: Secondary | ICD-10-CM

## 2020-01-23 DIAGNOSIS — Z9119 Patient's noncompliance with other medical treatment and regimen: Secondary | ICD-10-CM

## 2020-01-23 DIAGNOSIS — Z792 Long term (current) use of antibiotics: Secondary | ICD-10-CM | POA: Diagnosis not present

## 2020-01-23 DIAGNOSIS — Z7189 Other specified counseling: Secondary | ICD-10-CM | POA: Diagnosis not present

## 2020-01-23 DIAGNOSIS — E119 Type 2 diabetes mellitus without complications: Secondary | ICD-10-CM

## 2020-01-23 DIAGNOSIS — E440 Moderate protein-calorie malnutrition: Secondary | ICD-10-CM | POA: Diagnosis not present

## 2020-01-23 DIAGNOSIS — Z882 Allergy status to sulfonamides status: Secondary | ICD-10-CM

## 2020-01-23 DIAGNOSIS — Z4659 Encounter for fitting and adjustment of other gastrointestinal appliance and device: Secondary | ICD-10-CM

## 2020-01-23 DIAGNOSIS — Z79899 Other long term (current) drug therapy: Secondary | ICD-10-CM | POA: Diagnosis not present

## 2020-01-23 LAB — COMPREHENSIVE METABOLIC PANEL
ALT: 25 U/L (ref 0–44)
AST: 22 U/L (ref 15–41)
Albumin: 4 g/dL (ref 3.5–5.0)
Alkaline Phosphatase: 69 U/L (ref 38–126)
Anion gap: 11 (ref 5–15)
BUN: 14 mg/dL (ref 8–23)
CO2: 26 mmol/L (ref 22–32)
Calcium: 8.7 mg/dL — ABNORMAL LOW (ref 8.9–10.3)
Chloride: 102 mmol/L (ref 98–111)
Creatinine, Ser: 0.7 mg/dL (ref 0.44–1.00)
GFR, Estimated: >60 mL/min (ref >60)
Glucose, Bld: 276 mg/dL — ABNORMAL HIGH (ref 70–99)
Potassium: 3.4 mmol/L — ABNORMAL LOW (ref 3.5–5.1)
Sodium: 139 mmol/L (ref 135–145)
Total Bilirubin: 0.6 mg/dL (ref 0.3–1.2)
Total Protein: 8 g/dL (ref 6.5–8.1)

## 2020-01-23 LAB — RESP PANEL BY RT-PCR (FLU A&B, COVID) ARPGX2
Influenza A by PCR: NEGATIVE
Influenza B by PCR: NEGATIVE
SARS Coronavirus 2 by RT PCR: NEGATIVE

## 2020-01-23 LAB — CBC WITH DIFFERENTIAL/PLATELET
Abs Immature Granulocytes: 0.06 10*3/uL (ref 0.00–0.07)
Basophils Absolute: 0 10*3/uL (ref 0.0–0.1)
Basophils Relative: 0 %
Eosinophils Absolute: 0 10*3/uL (ref 0.0–0.5)
Eosinophils Relative: 0 %
HCT: 42.3 % (ref 36.0–46.0)
Hemoglobin: 13.4 g/dL (ref 12.0–15.0)
Immature Granulocytes: 1 %
Lymphocytes Relative: 10 %
Lymphs Abs: 1.3 10*3/uL (ref 0.7–4.0)
MCH: 26 pg (ref 26.0–34.0)
MCHC: 31.7 g/dL (ref 30.0–36.0)
MCV: 82.1 fL (ref 80.0–100.0)
Monocytes Absolute: 0.5 10*3/uL (ref 0.1–1.0)
Monocytes Relative: 4 %
Neutro Abs: 10.9 10*3/uL — ABNORMAL HIGH (ref 1.7–7.7)
Neutrophils Relative %: 85 %
Platelets: 277 10*3/uL (ref 150–400)
RBC: 5.15 MIL/uL — ABNORMAL HIGH (ref 3.87–5.11)
RDW: 14.1 % (ref 11.5–15.5)
WBC: 12.9 10*3/uL — ABNORMAL HIGH (ref 4.0–10.5)
nRBC: 0 % (ref 0.0–0.2)

## 2020-01-23 LAB — BLOOD GAS, ARTERIAL
Acid-Base Excess: 0.7 mmol/L (ref 0.0–2.0)
Acid-Base Excess: 3 mmol/L — ABNORMAL HIGH (ref 0.0–2.0)
Bicarbonate: 29.8 mmol/L — ABNORMAL HIGH (ref 20.0–28.0)
Bicarbonate: 27.1 mmol/L (ref 20.0–28.0)
FIO2: 0.35
FIO2: 0.28
MECHVT: 400 mL
O2 Saturation: 88.6 %
O2 Saturation: 97.6 %
PEEP: 5 cmH2O
Patient temperature: 37
Patient temperature: 37
RATE: 18 resp/min
pCO2 arterial: 68 mmHg (ref 32.0–48.0)
pCO2 arterial: 39 mmHg (ref 32.0–48.0)
pH, Arterial: 7.25 — ABNORMAL LOW (ref 7.350–7.450)
pH, Arterial: 7.45 (ref 7.350–7.450)
pO2, Arterial: 65 mmHg — ABNORMAL LOW (ref 83.0–108.0)
pO2, Arterial: 94 mmHg (ref 83.0–108.0)

## 2020-01-23 LAB — URINALYSIS, COMPLETE (UACMP) WITH MICROSCOPIC
Bacteria, UA: NONE SEEN
Bilirubin Urine: NEGATIVE
Glucose, UA: >=500 mg/dL — AB
Ketones, ur: 5 mg/dL — AB
Leukocytes,Ua: NEGATIVE
Nitrite: NEGATIVE
Protein, ur: >=300 mg/dL — AB
Specific Gravity, Urine: 1.028 (ref 1.005–1.030)
pH: 6 (ref 5.0–8.0)

## 2020-01-23 LAB — PROCALCITONIN: Procalcitonin: <0.1 ng/mL

## 2020-01-23 LAB — PROTIME-INR
INR: 1.1 (ref 0.8–1.2)
Prothrombin Time: 13.3 seconds (ref 11.4–15.2)

## 2020-01-23 LAB — APTT: aPTT: 26 seconds (ref 24–36)

## 2020-01-23 LAB — LACTIC ACID, PLASMA
Lactic Acid, Venous: 1 mmol/L (ref 0.5–1.9)
Lactic Acid, Venous: 1.9 mmol/L (ref 0.5–1.9)

## 2020-01-23 MED ORDER — VANCOMYCIN HCL 750 MG/150ML IV SOLN
750.0000 mg | Freq: Two times a day (BID) | INTRAVENOUS | Status: DC
  Filled 2020-01-23: qty 150

## 2020-01-23 MED ORDER — ONDANSETRON HCL 4 MG/2ML IJ SOLN
4.0000 mg | Freq: Four times a day (QID) | INTRAMUSCULAR | Status: DC | PRN
  Administered 2020-01-30: 4 mg via INTRAVENOUS
  Filled 2020-01-23: qty 2

## 2020-01-23 MED ORDER — ENOXAPARIN SODIUM 40 MG/0.4ML ~~LOC~~ SOLN
40.0000 mg | SUBCUTANEOUS | Status: DC
  Administered 2020-01-23 – 2020-02-12 (×21): 40 mg via SUBCUTANEOUS
  Filled 2020-01-23 (×21): qty 0.4

## 2020-01-23 MED ORDER — CHLORHEXIDINE GLUCONATE 0.12% ORAL RINSE (MEDLINE KIT)
15.0000 mL | Freq: Two times a day (BID) | OROMUCOSAL | Status: DC
  Administered 2020-01-23 – 2020-01-27 (×8): 15 mL via OROMUCOSAL
  Filled 2020-01-23 (×2): qty 15

## 2020-01-23 MED ORDER — SODIUM CHLORIDE 0.9 % IV SOLN
2.0000 g | Freq: Once | INTRAVENOUS | Status: AC
  Administered 2020-01-23: 2 g via INTRAVENOUS
  Filled 2020-01-23: qty 2

## 2020-01-23 MED ORDER — PANTOPRAZOLE SODIUM 40 MG IV SOLR
40.0000 mg | Freq: Every day | INTRAVENOUS | Status: DC
  Administered 2020-01-23 – 2020-02-02 (×10): 40 mg via INTRAVENOUS
  Filled 2020-01-23 (×10): qty 40

## 2020-01-23 MED ORDER — SODIUM CHLORIDE 0.9 % IV SOLN
2.0000 g | Freq: Two times a day (BID) | INTRAVENOUS | Status: DC
  Filled 2020-01-23: qty 2

## 2020-01-23 MED ORDER — ORAL CARE MOUTH RINSE
15.0000 mL | OROMUCOSAL | Status: DC
  Administered 2020-01-23 – 2020-01-28 (×31): 15 mL via OROMUCOSAL
  Filled 2020-01-23 (×7): qty 15

## 2020-01-23 MED ORDER — VANCOMYCIN HCL 1750 MG/350ML IV SOLN
1750.0000 mg | Freq: Once | INTRAVENOUS | Status: AC
  Administered 2020-01-23: 1750 mg via INTRAVENOUS
  Filled 2020-01-23: qty 350

## 2020-01-23 MED ORDER — VANCOMYCIN HCL IN DEXTROSE 1-5 GM/200ML-% IV SOLN
1000.0000 mg | Freq: Once | INTRAVENOUS | Status: DC
  Filled 2020-01-23: qty 200

## 2020-01-23 NOTE — ED Provider Notes (Signed)
[facility] Emergency Department Provider Note  ____________________________________________   I have reviewed the triage vital signs and the nursing notes.   HISTORY  Chief Complaint Altered Mental Status and Shortness of Breath   History limited by and level 5 caveat due to: Altered Mental Status   HPI Angela David is a 79 y.o. female who presents to the emergency department today from home because of concerns for altered mental status.  Patient is unable to give any history.  Per report patient was just discharged from the hospital recently for pneumonia.  Since going home the patient has had a drastic change in her mental status.  Family noticed that overnight.    Records reviewed. Per medical record review patient has a history of discharge from the hospital two days ago. It appears concerns included acute encephalopathy, PNA. Patient does have history of trach apparently secondary to vocal cord paralysis.   Past Medical History:  Diagnosis Date  . Breast cancer ([place]) 2002   right breast chemo and radiation  . Cancer [facility]) 2002   right breast  . Diabetes mellitus without complication ([place])   . Diabetic peripheral neuropathy ([place]) 08/28/2016  . GERD (gastroesophageal reflux disease)   . Glaucoma   . High cholesterol   . History of colon polyps   . Peripheral neuropathy 08/28/2016  . Personal history of chemotherapy 2002   BREAST CA  . Personal history of malignant neoplasm of breast   . Personal history of radiation therapy 2002   BREAST CA  . Vocal cord paralysis 2012    Patient Active Problem List   Diagnosis Date Noted  . Left lower lobe pneumonia 01/16/2020  . 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 Drug-induced polyneuropathy ([place]) 05/05/2019  . 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 Weakness   . Hypoxia   . Acute respiratory failure with hypoxia and hypercapnia ([place]) 03/21/2019  . Hypotension 03/21/2019   . Acute encephalopathy 03/21/2019  . Multiple fractures of ribs, right side, initial encounter for closed fracture 12/25/2018  . Colon polyp 11/06/2018  . Esophageal dysphagia   . 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 Stricture and stenosis of esophagus   . Elevated CEA 09/02/2018  . Goals of care, counseling/discussion 09/02/2018  . Polyp of transverse colon 09/02/2018  . Foreign body in right foot 05/26/2018  . History of tracheostomy 04/30/2018  . Moderate episode of recurrent major depressive disorder ([place]) 04/03/2018  . Poor balance 04/03/2018  . Shoulder lesion, right 02/28/2017  . Cervical radiculopathy 09/28/2016  . Peripheral neuropathy 08/28/2016  . Diabetic peripheral neuropathy ([place]) 08/28/2016  . Leg weakness, bilateral 08/13/2016  . Gait abnormality 08/13/2016  . Carcinoma of overlapping sites of right breast in female, estrogen receptor negative ([place]) 12/27/2015  . Malignant neoplasm of breast ([place]) 03/28/2015  . 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 Hypercholesterolemia 03/28/2015  . Recurrent major depressive disorder, in partial remission ([place]) 03/01/2015  . 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 Cephalalgia 09/15/2014  . Headache due to trauma 09/15/2014  . Osteopenia 01/07/2014  . Age-related osteoporosis without current pathological fracture 01/07/2014  . History of colonic polyps 12/14/2013  . History of breast cancer 11/26/2012  . H/O malignant neoplasm of breast 11/26/2012  . 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 Tracheostomy present ([place]) 12/06/2011  . Calcification of bronchus or trachea 01/16/2011  . 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 Anxiety state 12/19/2010  . Blood in sputum 12/19/2010  .  HLD (hyperlipidemia) 12/19/2010  . Type 2 diabetes mellitus ([place]) 12/19/2010    Past Surgical History:  Procedure Laterality Date  . ABDOMINAL HYSTERECTOMY    . BREAST BIOPSY Right 2008   neg  . BREAST EXCISIONAL BIOPSY  Right 2002   positive  . BREAST LUMPECTOMY Right 2002   chemo and radiation  . BREAST SURGERY Right 2002   LUMPECTOMY, radiation, chemotherapy  . CHOLECYSTECTOMY N/A 12/10/2017   Procedure: LAPAROSCOPIC CHOLECYSTECTOMY;  Surgeon: Benjamine Sprague, DO;  Location: [facility] ORS;  Service: General;  Laterality: N/A;  . COLON RESECTION Right 11/06/2018   Procedure: HAND ASSISTED LAPAROSCOPIC COLON RESECTION, RIGHT;  Surgeon: Jules Husbands, MD;  Location: [facility] ORS;  Service: General;  Laterality: Right;  . COLONOSCOPY  2009,12/30/2013  . COLONOSCOPY WITH PROPOFOL N/A 04/16/2018   Tubulovillous polyp proximal transverse colon, large sessile polyp, proximal transverse colon;  Surgeon: Robert Bellow, MD;  Location: [facility] ENDOSCOPY;  Service: Endoscopy;  Laterality: N/A;  . ESOPHAGOGASTRODUODENOSCOPY (EGD) WITH PROPOFOL N/A 10/14/2018   Procedure: ESOPHAGOGASTRODUODENOSCOPY (EGD) WITH PROPOFOL;  Surgeon: Lucilla Lame, MD;  Location: [facility] ENDOSCOPY;  Service: Endoscopy;  Laterality: N/A;  . HYDROCELE EXCISION / REPAIR    . TRACHEAL SURGERY  2013  . TRACHEOSTOMY N/A    Dr Kathyrn Sheriff    Prior to Admission medications   Medication Sig Start Date End Date Taking? Authorizing Provider  acetaminophen (TYLENOL) 325 MG tablet Take 2 tablets (650 mg total) by mouth every 6 (six) hours as needed for moderate pain or fever. 04/01/19   Tyler Pita, MD  allopurinol (ZYLOPRIM) 100 MG tablet Take 100 mg by mouth daily.    [provider]  amLODipine (NORVASC) 10 MG tablet Take 1 tablet (10 mg total) by mouth daily. 01/22/20   Fritzi Mandes, MD  amoxicillin-clavulanate (AUGMENTIN) 875-125 MG tablet Take 1 tablet by mouth every 12 (twelve) hours for 5 days. 01/21/20 01/26/20  Fritzi Mandes, MD  busPIRone (BUSPAR) 15 MG tablet Take 30 mg by mouth 2 (two) times daily.    [provider]  Calcium Carbonate-Vitamin D 600-400 MG-UNIT tablet Take 1 tablet by mouth daily.     [provider]   escitalopram (LEXAPRO) 10 MG tablet Take 10 mg by mouth daily. (take with 20mg  tablet to equal 30mg  total daily)    [provider]  escitalopram (LEXAPRO) 20 MG tablet Take 20 mg by mouth daily. (take with 10mg  tablet to equal 30mg  total daily)    [provider]  feeding supplement (ENSURE ENLIVE / ENSURE PLUS) LIQD Take 237 mLs by mouth 3 (three) times daily between meals. 01/21/20   Fritzi Mandes, MD  fluconazole (DIFLUCAN) 150 MG tablet Take 150 mg by mouth 2 (two) times a week.    [provider]  folic acid (FOLVITE) 1 MG tablet Take 1 mg by mouth daily.    [provider]  gabapentin (NEURONTIN) 600 MG tablet Take 600 mg by mouth 3 (three) times daily. 05/26/19   [provider]  lovastatin (MEVACOR) 20 MG tablet Take 20 mg by mouth at bedtime.    [provider]  Melatonin 5 MG TABS Take 1 tablet (5 mg total) by mouth at bedtime. 04/01/19   Tyler Pita, MD  meloxicam (MOBIC) 15 MG tablet Take 15 mg by mouth daily. 01/01/20   [provider]  metoprolol tartrate (LOPRESSOR) 25 MG tablet Take 1 tablet (25 mg total) by mouth 2 (two) times daily. Patient taking differently: Take 25 mg  by mouth daily.  04/01/19   Tyler Pita, MD  Nebulizers (COMPRESSOR/NEBULIZER) MISC Nebulizer with supplies, trach collar for med admin. 04/01/19   Tyler Pita, MD  pantoprazole (PROTONIX) 40 MG tablet Take 40 mg by mouth daily.     [provider]  rOPINIRole (REQUIP) 0.5 MG tablet Take 0.5 mg by mouth at bedtime.  06/09/18   [provider]  scopolamine (TRANSDERM-SCOP) 1 MG/3DAYS Place 1 patch (1.5 mg total) onto the skin every 3 (three) days. 01/24/20   Fritzi Mandes, MD  sodium chloride 0.9 % nebulizer solution Take 3 mLs by nebulization as needed for wheezing.  07/17/19   [provider]    Allergies Shellfish allergy, Sodium sulfite, Sulfa antibiotics, and Codeine  Family History  Problem Relation Age of  Onset  . Cancer Father        bone cancer  . Cancer Sister        lymphoma  . Cancer Brother   . Breast cancer Neg Hx     Social History Social History   Tobacco Use  . Smoking status: Never Smoker  . Smokeless tobacco: Never Used  Vaping Use  . Vaping Use: Never used  Substance Use Topics  . Alcohol use: No  . Drug use: No    Review of Systems Unable to obtain secondary to AMS ____________________________________________   PHYSICAL EXAM:  VITAL SIGNS: ED Triage Vitals  Enc Vitals Group     BP 155/60     Pulse 86     Resp 22     Temp      Temp src      SpO2 100     Weight      Height      Head Circumference      Peak Flow      Pain Score      Pain Loc      Pain Edu?      Excl. in [place]?      Constitutional: Obtunded.  Eyes: Conjunctivae are normal.  ENT      Head: Normocephalic and atraumatic.      Nose: No congestion/rhinnorhea.      Mouth/Throat: Mucous membranes are moist.      Neck: No stridor. Trach in place.  Hematological/Lymphatic/Immunilogical: No cervical lymphadenopathy. Cardiovascular: Normal rate, regular rhythm.  Positive for murmur.  Respiratory: Normal respiratory effort without tachypnea nor retractions. Breath sounds are clear and equal bilaterally. No wheezes/rales/rhonchi. Gastrointestinal: Soft and non tender. No rebound. No guarding.  Genitourinary: Deferred Musculoskeletal: No deformity.  Neurologic:  Obtunded. Minimal response to painful stimuli.  Skin:  Skin is warm, dry and intact. No rash noted.  ____________________________________________    LABS (pertinent positives/negatives)  CBC wbc 12.9, hgb 13.4, plt 277 ABG pH 7.25, pCO2 68 COVID negative UA hazy, leukocytes, nitrite negative, 0-5 wbc CMP wnl except k 3.4, glu 276, ca 8.7 ____________________________________________   EKG  I, Nance Pear, attending physician, personally viewed and interpreted this EKG  EKG Time: 2016 Rate: 89 Rhythm: sinus  rhythm Axis: normal Intervals: qtc 498 QRS: narrow ST changes: no st elevation Impression: biatrial enlargement, abnormal ekg  ____________________________________________    RADIOLOGY  CT head No acute abnormality  CXR Hazy coarse lung markings  ____________________________________________   PROCEDURES  Procedures  CRITICAL CARE Performed by: Nance Pear   Total critical care time: 35 minutes  Critical care time was exclusive of separately billable procedures and treating other patients.  Critical care was necessary  to treat or prevent imminent or life-threatening deterioration.  Critical care was time spent personally by me on the following activities: development of treatment plan with patient and/or surrogate as well as nursing, discussions with consultants, evaluation of patient's response to treatment, examination of patient, obtaining history from patient or surrogate, ordering and performing treatments and interventions, ordering and review of laboratory studies, ordering and review of radiographic studies, pulse oximetry and re-evaluation of patient's condition.  ____________________________________________   INITIAL IMPRESSION / ASSESSMENT AND PLAN / ED COURSE  Pertinent labs & imaging results that were available during my care of the patient were reviewed by me and considered in my medical decision making (see chart for details).   Patient presented from home because of concerns for altered mental status.  Patient was discharged from the hospital 2 days ago.  Per chart review the patient was seen for encephalopathy thought secondary to been pneumonia.  On exam here patient is fairly obtunded and only has minimal response to physical stimuli.  Patient does have tracheostomy in place.  Respiratory was contacted.  ABG was drawn and was concerning for hypercapnia. Because of this patient was placed on a ventilator. Additionally will place patient back on  antibiotics given leukocytosis which had resolved by time of discharge. Discussed concerns with patient's husband. Will admit to ICU.  ____________________________________________   FINAL CLINICAL IMPRESSION(S) / ED DIAGNOSES  Final diagnoses:  Altered mental status, unspecified altered mental status type  Hypercapnia     Note: This dictation was prepared with Dragon dictation. Any transcriptional errors that result from this process are unintentional     Nance Pear, MD 01/23/20 2214

## 2020-01-23 NOTE — ED Notes (Signed)
Lab called to obtain second set of blood cultures.

## 2020-01-23 NOTE — ED Notes (Signed)
RT at bedside placing patient on ventilator.

## 2020-01-23 NOTE — ED Notes (Signed)
Lab at bedside to obtain blood cultures and lactic.

## 2020-01-23 NOTE — Consult Note (Signed)
[facility] for Electrolyte Monitoring and Replacement   Recent Labs: Potassium (mmol/L)  Date Value  01/23/2020 3.4 (L)  12/17/2011 4.1   Magnesium (mg/dL)  Date Value  01/20/2020 1.8  12/17/2011 2.2   Calcium (mg/dL)  Date Value  01/23/2020 8.7 (L)   Calcium, Total (mg/dL)  Date Value  12/17/2011 8.8   Albumin (g/dL)  Date Value  01/23/2020 4.0  08/28/2016 4.3  12/16/2011 4.1   Phosphorus (mg/dL)  Date Value  03/31/2019 2.7  12/16/2011 2.2 (L)   Sodium (mmol/L)  Date Value  01/23/2020 139  08/28/2016 143  12/17/2011 139    Assessment: Patient is a 79 y/o F with medical history including history of breast cancer, diabetes, GERD, tracheal stenosis s/p tracheostomy who presented to the ED 11/27 with unresponsiveness. Patient was recently admitted 10/27 thru 11/19 with altered mental status and pneumonia. Pharmacy has been consulted to assist with monitoring and repletion of electrolytes as indicated.   Patient is currently NPO. Admission EKG with QTc 498.  Goal of Therapy:  Electrolytes within normal limits  Plan:  --K 3.4, will hold off on replacement for now as only slightly low --Follow-up electrolytes tomorrow AM  Benita Gutter 01/23/2020 10:03 PM

## 2020-01-23 NOTE — H&P (Addendum)
NAME:  Angela David, MRN:  [id number], DOB:  [birth year], LOS: 0 ADMISSION DATE:  01/23/2020, CONSULTATION DATE:  01/23/2020 REFERRING MD:  Dr. 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    History of present illness   79 yo female presented to the emergency department from home because of family concerns regarding worsening altered mental status.  Upon arrival to the ED patient was unable to give any history due to lethargy.  She had a recent admission at [facility] for altered mental status and suspected aspiration pneumonia.  The patient was discharged 2 days ago on 01/21/2020 and family reported via ED documentation that the patient has undergone a drastic mental status change.  ED work-up included a CT head which was negative for any acute abnormality. ABG revealed respiratory acidosis with hypoxia and hypercarbia and lab work showed leukocytosis.  Initial lactic acid was unimpressive at 1.0, repeat 1.9, cultures and procalcitonin are pending.  Chest x-ray shows no significant interval change, with hazy lung markings and unchanged potential opacity vs small left lower lobe pleural effusion. PCCM team consulted for further management and monitoring with admission to the ICU.  Past Medical History  Tracheal stenosis s/p tracheostomy Breast Cancer - 2002 T2DM HLD Glaucoma GERD HTN  Significant Hospital Events   01/23/20 AMS in ED, hypercarbic- placed on ventilator > Admit to ICU  Consults:  TOC  Procedures:    Significant Diagnostic Tests:  01/23/20 CT head w/o contrast >> no acute abnormality   Micro Data:  01/23/20 COVID - 19 >> negative 01/23/20 Influenza A/B >> negative 01/23/20 Blood cultures x 2 >> 01/23/20 Urine culture >> 01/23/20 Tracheal aspirate  >>  Antimicrobials:  01/23/20 Cefepime >> 01/23/20 Vancomycin >>  Interim history/subjective:  Patient lying in bed, unresponsive to voice, opened eyes to physical stimulation- unable to follow commands. Husband bedside, stating that his wife has been "sleeping more than usual since coming home". He stated she was diaphoretic at one point, but he did not take her temperature and is unsure if she was febrile or having chills. He denies diarrhea, nausea or vomiting.  He did ask lots of questions regarding PEG tube placement. He is considered about her swallowing and we discussed the fact that she is potentially aspirating. We discussed the plan of care overnight is keeping her on the ventilator, placing an NGT, antibiotics and IVF resuscitation. I also said I would place a palliative care consult so that [facility] could "check in" with them so they could talk through these upcoming decisions. All questions answered.  Labs/ Imaging personally reviewed Na+/ K+: 139/ 3.4 BUN/Cr.: 14/ 0.70 Hgb: 13.4  WBC/ TMAX: 12.9/ 36 Lactic/ PCT: 1.0/ pending  ABG: 7.25/ 68/ 65/ 29.8 CXR 01/23/20: Tracheostomy in stable position, hazy coarse lung markings- unchanged from previous admission. Potential LLL opacity vs small pleural effusion  Objective   Blood pressure (!) 144/57, pulse 92, temperature (!) 96.8 F (36 C), temperature source Bladder, resp. rate (!) 21, SpO2 98 %.    Vent Mode: AC FiO2 (%):  [35 %] 35 % Set Rate:  [18 bmp] 18 bmp Vt Set:  [400 mL] 400 mL PEEP:  [5 cmH20] 5 cmH20  No intake or output data in the 24 hours ending 01/23/20 2158 There were no vitals filed for this visit.  Examination:  General: Adult female, critically vs chronically ill, lying in bed requiring mechanical ventilation, NAD HEENT: MM pink/ dry, anicteric, atraumatic, neck supple Neuro: RASS-4, unable to follow commands, PERRL +3, small movements seen in all limbs, generalized weakness CV: s1s2 RRR, NSR on monitor,  no r/m/g Pulm: Regular, non labored on AC at 28%, breath sounds clear/diminished-RUL/ diminished- RLL & rhonchi/diminished-LUL/ diminished with fine crackles-LLL GI: soft, rounded, with known hernia, bs x 4 GU: foley in place with clear yellow urine Skin: scattered ecchymosis, no rashes/lesions noted Extremities: warm/dry, pulses + 2 R/P, +1 BLE edema noted  Resolved Hospital Problem list     Assessment & Plan:  Acute Hypoxic & Hypercapnic Respiratory Failure  Secondary to suspected aspiration pneumonia in the setting of 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  Family concerned due to worsening altered mental status at home.  Leukocytosis on arrival to ED, WBC: 12.9.  ABG revealed respiratory acidosis with hypoxia and hypercarbia.  Placed on ventilator in ED for respiratory support on 01/23/20. - Ventilator settings: AC 8 mL/kg, 28% FiO2, 5 PEEP, will transition to [facility] once in ICU, continue ventilator support & lung protective strategies - Wean PEEP & FiO2 as tolerated, maintain SpO2 > 90% - Head of bed elevated 30 degrees, VAP protocol in place - Plateau pressures less than 30 cm H20  - Intermittent chest x-ray & ABG PRN - Daily WUA with SBT as tolerated - will hold off on sedation due to AMS, RASS: -4, will add as needed - Ensure adequate pulmonary hygiene  - albuterol nebulizer Q 4 PRN  Suspected Aspiration Pneumonia Patient has known swallowing difficulties, assessed by SLP last admit with hesitant dysphagia diet orders. SLP documented that visualization was poor due to patient position. - F/u cultures, trend lactic/PCT - continue vancomycin & cefepime for suspected aspiration PNA coverage - daily CBC - monitor WBC/ fever curve - insert NGT for medication administration and future nutrition  Acute encephalopathy multifocal in the setting of suspected infection  and hypercapnea Patient has recent previous admission with altered mental status and treatment for aspiration pneumonia with discharge on 01/21/2020.  Family concerned due to worsening altered mental status at home. CT head 01/23/20 negative. MRI from previous admit 01/17/20 revealed no acute abnormalities, advanced chronic microvascular ischemic changes. - Neuro checks Q 4 - maintain ventilator support overnight - hold any sedating medications, including home medications: gabapentin, ropinirole, buspirone - hepatic function normal, TSH normal 01/16/20  Hypotension multifocal due to poor PO intake in the setting of suspected aspiration pneumonia Husband reports very poor PO intake due to lethargy and swallowing difficulties. Initial lactic 1.0 ~ 1.9 so patient did not qualify for sepsis protocol IVF resuscitation. - LR bolus & continuous IVF - continuous cardiac monitoring - consider peripheral levophed drip PRN to maintain MAP > 65 - home medications: metoprolol held due to hypotension  Best practice (evaluated daily)  Diet: NPO Pain/Anxiety/Delirium protocol (if indicated): will hold off for now d/t lethargy, add IVP as needed VAP protocol (if indicated): established DVT prophylaxis: enoxaparin SQ GI prophylaxis: protonix IV Glucose control: Q 4 monitor, target range 140-180 Mobility: bedrest, mobilize as tolerated last date of multidisciplinary goals of care discussion: prior admission Family and staff present: husband & APP Summary of discussion: will place palliative consult to help assist patient and husband in decision r/t future PEG placement Follow up goals of care discussion due: 01/25/20 (hopefully when pt  is more alert and can participate, with palliative involvement) Code Status: FULL Disposition: ICU  Labs   CBC: Recent Labs  Lab 01/17/20 0436 01/19/20 0409 01/20/20 0719 01/23/20 2027  WBC 7.8 7.7 6.7 12.9*  NEUTROABS 5.9  --   --  10.9*  HGB 12.8 11.9* 10.9* 13.4   HCT 39.7 37.4 34.5* 42.3  MCV 79.9* 83.1 82.9 82.1  PLT 218 183 171 951    Basic Metabolic Panel: Recent Labs  Lab 01/17/20 0436 01/18/20 0254 01/19/20 0409 01/20/20 0719 01/23/20 2027  NA 137 137 141 143 139  K 3.7 3.5 3.4* 3.0* 3.4*  CL 105 102 106 107 102  CO2 24 23 23 23 26   GLUCOSE 182* 163* 122* 116* 276*  BUN 18 15 11 9 14   CREATININE 0.95 0.77 0.72 0.71 0.70  CALCIUM 10.2 9.0 8.3* 7.9* 8.7*  MG 1.7  --  1.9 1.8  --    GFR: Estimated Creatinine Clearance: 57.3 mL/min (by C-G formula based on SCr of 0.7 mg/dL). Recent Labs  Lab 01/17/20 0436 01/18/20 0254 01/19/20 0409 01/20/20 0719 01/23/20 2027  PROCALCITON  --  0.12 0.10  --   --   WBC 7.8  --  7.7 6.7 12.9*  LATICACIDVEN  --   --   --   --  1.0    Liver Function Tests: Recent Labs  Lab 01/23/20 2027  AST 22  ALT 25  ALKPHOS 69  BILITOT 0.6  PROT 8.0  ALBUMIN 4.0   No results for input(s): LIPASE, AMYLASE in the last 168 hours. No results for input(s): AMMONIA in the last 168 hours.  ABG    Component Value Date/Time   PHART 7.25 (L) 01/23/2020 2027   PCO2ART 68 (HH) 01/23/2020 2027   PO2ART 65 (L) 01/23/2020 2027   HCO3 29.8 (H) 01/23/2020 2027   ACIDBASEDEF 0.9 01/19/2020 1024   O2SAT 88.6 01/23/2020 2027     Coagulation Profile: Recent Labs  Lab 01/23/20 2027  INR 1.1    Cardiac Enzymes: No results for input(s): CKTOTAL, CKMB, CKMBINDEX, TROPONINI in the last 168 hours.  HbA1C: Hgb A1c MFr Bld  Date/Time Value Ref Range Status  01/20/2020 07:19 AM 6.3 (H) 4.8 - 5.6 % Final    Comment:    (NOTE) Pre diabetes:          5.7%-6.4%  Diabetes:              >6.4%  Glycemic control for   <7.0% adults with diabetes   03/21/2019 03:24 AM 6.6 (H) 4.8 - 5.6 % Final    Comment:    (NOTE) Pre diabetes:          5.7%-6.4% Diabetes:              >6.4% Glycemic control for   <7.0% adults with diabetes     CBG: Recent Labs  Lab 01/20/20 1948 01/20/20 2341 01/21/20 0419  01/21/20 0733 01/21/20 1137  GLUCAP 142* 135* 153* 156* 122*    Review of Systems:   UTA- patient unresponsive, per husband's report patient +weak, +fatigued, +diaphoretic Denies nausea, vomiting, diarrhea, fever, chills, pain  Past Medical History  She,  has a past medical history of Breast cancer ([place]) (2002), Cancer ([place]) (2002), Diabetes mellitus without complication ([place]), 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   Surgical History    Past Surgical History:  Procedure Laterality Date  . ABDOMINAL HYSTERECTOMY    . BREAST BIOPSY Right 2008   neg  . BREAST EXCISIONAL BIOPSY Right 2002   positive  . BREAST LUMPECTOMY Right 2002   chemo and radiation  . BREAST SURGERY Right 2002   LUMPECTOMY, radiation, chemotherapy  . CHOLECYSTECTOMY N/A 12/10/2017   Procedure: LAPAROSCOPIC CHOLECYSTECTOMY;  Surgeon: Benjamine Sprague, DO;  Location: [facility] ORS;  Service: General;  Laterality: N/A;  . COLON RESECTION Right 11/06/2018   Procedure: HAND ASSISTED LAPAROSCOPIC COLON RESECTION, RIGHT;  Surgeon: Jules Husbands, MD;  Location: [facility] ORS;  Service: General;  Laterality: Right;  . COLONOSCOPY  2009,12/30/2013  . COLONOSCOPY WITH PROPOFOL N/A 04/16/2018   Tubulovillous polyp proximal transverse colon, large sessile polyp, proximal transverse colon;  Surgeon: Robert Bellow, MD;  Location: [facility] ENDOSCOPY;  Service: Endoscopy;  Laterality: N/A;  . ESOPHAGOGASTRODUODENOSCOPY (EGD) WITH PROPOFOL N/A 10/14/2018   Procedure: ESOPHAGOGASTRODUODENOSCOPY (EGD) WITH PROPOFOL;  Surgeon: Lucilla Lame, MD;  Location: [facility] ENDOSCOPY;  Service: Endoscopy;  Laterality: N/A;  . HYDROCELE EXCISION / REPAIR    .  TRACHEAL SURGERY  2013  . TRACHEOSTOMY N/A    Dr Kathyrn Sheriff     Social History   reports that she has never smoked. She has never used smokeless tobacco. She reports that she does not drink alcohol and does not use drugs.   Family History   Her family history includes Cancer in her brother, father, and sister. There is no history of Breast cancer.   Allergies Allergies  Allergen Reactions  . Shellfish Allergy Shortness Of Breath    respitatory distress   . Sodium Sulfite Shortness Of Breath    Severe Congestion that creates inability to breath  . Sulfa Antibiotics Other (See Comments) and Shortness Of Breath    Other reaction(s): OTHER Other reaction(s): Other (See Comments) respitatory distress  Other reaction(s): OTHER respitatory distress  Other reaction(s): OTHER Other reaction(s): Other (See Comments) respitatory distress  respitatory distress   . Codeine Other (See Comments)    Per patient "it kept her up all night"     Home Medications  Prior to Admission medications   Medication Sig Start Date End Date Taking? Authorizing Provider  acetaminophen (TYLENOL) 325 MG tablet Take 2 tablets (650 mg total) by mouth every 6 (six) hours as needed for moderate pain or fever. 04/01/19   Tyler Pita, MD  allopurinol (ZYLOPRIM) 100 MG tablet Take 100 mg by mouth daily.    [provider]  amLODipine (NORVASC) 10 MG tablet Take 1 tablet (10 mg total) by mouth daily. 01/22/20   Fritzi Mandes, MD  amoxicillin-clavulanate (AUGMENTIN) 875-125 MG tablet Take 1 tablet by mouth every 12 (twelve) hours for 5 days. 01/21/20 01/26/20  Fritzi Mandes, MD  busPIRone (BUSPAR) 15 MG tablet Take 30 mg by mouth 2 (two) times daily.    [provider]  Calcium Carbonate-Vitamin D 600-400 MG-UNIT tablet Take 1 tablet by mouth daily.     [provider]  escitalopram (LEXAPRO) 10 MG tablet Take 10 mg by mouth daily. (take with 20mg  tablet to equal 30mg  total daily)    [provider]  escitalopram (LEXAPRO) 20 MG tablet Take 20 mg by mouth daily. (take with 10mg  tablet to equal 30mg  total daily)    [provider]  feeding supplement (ENSURE ENLIVE / ENSURE PLUS) LIQD Take 237 mLs by mouth  3 (three) times daily between meals. 01/21/20   Fritzi Mandes, MD  fluconazole (DIFLUCAN) 150 MG tablet Take 150 mg by mouth 2 (two) times a week.    [provider]  folic acid (FOLVITE) 1 MG tablet Take 1 mg by mouth daily.    [provider]  gabapentin (NEURONTIN) 600 MG tablet Take 600 mg by mouth 3 (three) times daily. 05/26/19   [provider]  lovastatin (MEVACOR) 20 MG tablet Take 20 mg by mouth at bedtime.    [provider]  Melatonin 5 MG TABS Take 1 tablet (5 mg total) by mouth at bedtime. 04/01/19   Tyler Pita, MD  meloxicam (MOBIC) 15 MG tablet Take 15 mg by mouth daily. 01/01/20   [provider]  metoprolol tartrate (LOPRESSOR) 25 MG tablet Take 1 tablet (25 mg total) by mouth 2 (two) times daily. Patient taking differently: Take 25 mg by mouth daily.  04/01/19   Tyler Pita, MD  Nebulizers (COMPRESSOR/NEBULIZER) MISC Nebulizer with supplies, trach collar for med admin. 04/01/19   Tyler Pita, MD  pantoprazole (PROTONIX) 40 MG tablet Take 40 mg by mouth daily.     [provider]  rOPINIRole (REQUIP) 0.5 MG tablet Take 0.5 mg by mouth at bedtime.  06/09/18   [provider]  scopolamine (TRANSDERM-SCOP) 1 MG/3DAYS Place 1 patch (1.5 mg total) onto the skin every 3 (three) days. 01/24/20   Fritzi Mandes, MD  sodium chloride 0.9 % nebulizer solution Take 3 mLs by nebulization as needed for wheezing.  07/17/19   [provider]     Critical care time: 55 minutes     Venetia Night, AGACNP-BC Acute Care Nurse Practitioner [place]   [phone] / [phone] Please see Amion for pager details.

## 2020-01-23 NOTE — Consult Note (Signed)
Pharmacy Antibiotic Note  Angela David is a 79 y.o. female with history of breast cancer, esophageal stenosis s/p tracheostomy, diabetes admitted on 01/23/2020 with pneumonia / altered mental status / unresponsiveness. Patient was recently admitted 11/19 - 11/25 for the same and received ceftriaxone + Unasyn. 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 However, MRSA PCR during recent admission was negative. Pharmacy has been consulted for vancomycin + cefepime dosing.  Plan: Cefepime 2 g IV q12h  Vancomycin 1750 mg IV x 1 followed by maintenance dose of 750 mg IV q12h per nomogram --Daily Scr per protocol --Levels at steady state as indicated  Continue to monitor renal function and adjust antibiotic dosing as indicated. Continue to monitor culture data and narrow antibiotics as able.   Temp (24hrs), Avg:96.8 F (36 C), Min:96.8 F (36 C), Max:96.8 F (36 C)  Recent Labs  Lab 01/17/20 0436 01/18/20 0254 01/19/20 0409 01/20/20 0719 01/23/20 2027  WBC 7.8  --  7.7 6.7 12.9*  CREATININE 0.95 0.77 0.72 0.71 0.70  LATICACIDVEN  --   --   --   --  1.0    Estimated Creatinine Clearance: 57.3 mL/min (by C-G formula based on SCr of 0.7 mg/dL).    Allergies  Allergen Reactions   Shellfish Allergy Shortness Of Breath    respitatory distress    Sodium Sulfite Shortness Of Breath    Severe Congestion that creates inability to breath   Sulfa Antibiotics Other (See Comments) and Shortness Of Breath    Other reaction(s): OTHER Other reaction(s): Other (See Comments) respitatory distress  Other reaction(s): OTHER respitatory distress  Other reaction(s): OTHER Other reaction(s): Other (See Comments) respitatory distress  respitatory distress    Codeine Other (See Comments)    Per patient "it kept her up all night"    Antimicrobials this admission: Cefepime 11/27 >>  Vancomycin 11/27 >>   Dose adjustments this  admission: N/A  Microbiology results: 11/27 BCx: pending 11/27 UCx: pending  11/27 Sputum: pending  11/27 MRSA PCR: pending  Thank you for allowing pharmacy to be a part of this patients care.  Benita Gutter 01/23/2020 10:22 PM

## 2020-01-23 NOTE — ED Triage Notes (Signed)
Pt presents to ED via EMS from home, was discharged yesterday from admission for pneumonia. O2 saturation on EMS arrival was 90% on room air. Pt has chronic trach and NRB was placed on patient with O2 saturation increase to 100% with EMS. Per EMS per patient family, patient has had altered mental status x1 week that has worsened significantly tonight. Pt unresponsive on arrival to ED with NRB in place over trach with O2 saturation of 100%. Blood glucose with EMS 263.

## 2020-01-23 NOTE — ED Notes (Signed)
Husband at bedside.  

## 2020-01-23 NOTE — ED Notes (Signed)
Admitting NP at bedside

## 2020-01-23 NOTE — ED Notes (Addendum)
This RN attempted x2 for blood cultures without success. Per Archie Balboa MD, do not delay antibiotics for second set of blood cultures.

## 2020-01-23 NOTE — ED Notes (Signed)
Patient transported to CT by this RN 

## 2020-01-24 ENCOUNTER — Inpatient Hospital Stay: Payer: Medicare Other

## 2020-01-24 DIAGNOSIS — J9601 Acute respiratory failure with hypoxia: Secondary | ICD-10-CM | POA: Diagnosis not present

## 2020-01-24 LAB — AMMONIA: Ammonia: 19 umol/L (ref 9–35)

## 2020-01-24 LAB — POTASSIUM: Potassium: 3.2 mmol/L — ABNORMAL LOW (ref 3.5–5.1)

## 2020-01-24 LAB — LACTIC ACID, PLASMA
Lactic Acid, Venous: 7.2 mmol/L (ref 0.5–1.9)
Lactic Acid, Venous: 2.4 mmol/L (ref 0.5–1.9)

## 2020-01-24 LAB — BASIC METABOLIC PANEL
Anion gap: 12 (ref 5–15)
BUN: 14 mg/dL (ref 8–23)
CO2: 23 mmol/L (ref 22–32)
Calcium: 7.7 mg/dL — ABNORMAL LOW (ref 8.9–10.3)
Chloride: 105 mmol/L (ref 98–111)
Creatinine, Ser: 0.64 mg/dL (ref 0.44–1.00)
GFR, Estimated: >60 mL/min (ref >60)
Glucose, Bld: 122 mg/dL — ABNORMAL HIGH (ref 70–99)
Potassium: 3.4 mmol/L — ABNORMAL LOW (ref 3.5–5.1)
Sodium: 140 mmol/L (ref 135–145)

## 2020-01-24 LAB — MAGNESIUM
Magnesium: 1.3 mg/dL — ABNORMAL LOW (ref 1.7–2.4)
Magnesium: 3 mg/dL — ABNORMAL HIGH (ref 1.7–2.4)

## 2020-01-24 LAB — CBC
HCT: 36.3 % (ref 36.0–46.0)
Hemoglobin: 11.7 g/dL — ABNORMAL LOW (ref 12.0–15.0)
MCH: 26.2 pg (ref 26.0–34.0)
MCHC: 32.2 g/dL (ref 30.0–36.0)
MCV: 81.2 fL (ref 80.0–100.0)
Platelets: 236 10*3/uL (ref 150–400)
RBC: 4.47 MIL/uL (ref 3.87–5.11)
RDW: 14.3 % (ref 11.5–15.5)
WBC: 12.4 10*3/uL — ABNORMAL HIGH (ref 4.0–10.5)
nRBC: 0 % (ref 0.0–0.2)

## 2020-01-24 LAB — PROCALCITONIN: Procalcitonin: <0.1 ng/mL

## 2020-01-24 LAB — GLUCOSE, CAPILLARY
Glucose-Capillary: 142 mg/dL — ABNORMAL HIGH (ref 70–99)
Glucose-Capillary: 164 mg/dL — ABNORMAL HIGH (ref 70–99)
Glucose-Capillary: 107 mg/dL — ABNORMAL HIGH (ref 70–99)
Glucose-Capillary: 105 mg/dL — ABNORMAL HIGH (ref 70–99)
Glucose-Capillary: 140 mg/dL — ABNORMAL HIGH (ref 70–99)

## 2020-01-24 LAB — PHOSPHORUS
Phosphorus: 1.5 mg/dL — ABNORMAL LOW (ref 2.5–4.6)
Phosphorus: 3.1 mg/dL (ref 2.5–4.6)

## 2020-01-24 LAB — MRSA PCR SCREENING: MRSA by PCR: NEGATIVE

## 2020-01-24 LAB — STREP PNEUMONIAE URINARY ANTIGEN: Strep Pneumo Urinary Antigen: NEGATIVE

## 2020-01-24 MED ORDER — MAGNESIUM SULFATE 4 GM/100ML IV SOLN
4.0000 g | Freq: Once | INTRAVENOUS | Status: DC
  Filled 2020-01-24: qty 100

## 2020-01-24 MED ORDER — ROPINIROLE HCL 0.25 MG PO TABS
0.5000 mg | ORAL_TABLET | Freq: Every day | ORAL | Status: DC
  Administered 2020-01-24: 0.5 mg via ORAL
  Filled 2020-01-24: qty 2

## 2020-01-24 MED ORDER — ROPINIROLE HCL 0.25 MG PO TABS
0.5000 mg | ORAL_TABLET | Freq: Every day | ORAL | Status: DC
  Administered 2020-01-25 – 2020-02-12 (×15): 0.5 mg
  Filled 2020-01-24 (×9): qty 2
  Filled 2020-01-24: qty 1
  Filled 2020-01-24 (×9): qty 2

## 2020-01-24 MED ORDER — CHLORHEXIDINE GLUCONATE CLOTH 2 % EX PADS
6.0000 | MEDICATED_PAD | Freq: Every day | CUTANEOUS | Status: DC
  Administered 2020-01-24 – 2020-01-27 (×4): 6 via TOPICAL

## 2020-01-24 MED ORDER — MAGNESIUM SULFATE 2 GM/50ML IV SOLN
2.0000 g | Freq: Once | INTRAVENOUS | Status: AC
  Administered 2020-01-24: 2 g via INTRAVENOUS
  Filled 2020-01-24: qty 50

## 2020-01-24 MED ORDER — POTASSIUM PHOSPHATES 15 MMOLE/5ML IV SOLN
45.0000 mmol | Freq: Once | INTRAVENOUS | Status: AC
  Administered 2020-01-24: 45 mmol via INTRAVENOUS
  Filled 2020-01-24: qty 15

## 2020-01-24 MED ORDER — MAGNESIUM SULFATE 2 GM/50ML IV SOLN: 2.0000 g | Freq: Once | INTRAVENOUS | Status: DC

## 2020-01-24 MED ORDER — ALBUTEROL SULFATE (2.5 MG/3ML) 0.083% IN NEBU: 2.5000 mg | INHALATION_SOLUTION | RESPIRATORY_TRACT | Status: DC | PRN

## 2020-01-24 MED ORDER — SODIUM CHLORIDE 0.9 % IV SOLN
250.0000 mL | INTRAVENOUS | Status: DC
  Administered 2020-01-24 – 2020-01-31 (×2): 250 mL via INTRAVENOUS

## 2020-01-24 MED ORDER — ENSURE ENLIVE PO LIQD
237.0000 mL | Freq: Once | ORAL | Status: AC
  Administered 2020-01-24: 237 mL via ORAL

## 2020-01-24 MED ORDER — LACTATED RINGERS IV SOLN: INTRAVENOUS | Status: DC

## 2020-01-24 MED ORDER — PROSOURCE TF PO LIQD
45.0000 mL | Freq: Two times a day (BID) | ORAL | Status: DC
  Administered 2020-01-24 – 2020-01-25 (×2): 45 mL
  Filled 2020-01-24: qty 45

## 2020-01-24 MED ORDER — ACETAMINOPHEN 325 MG PO TABS
650.0000 mg | ORAL_TABLET | Freq: Four times a day (QID) | ORAL | Status: DC | PRN
  Administered 2020-01-24 – 2020-02-03 (×6): 650 mg via NASOGASTRIC
  Filled 2020-01-24 (×6): qty 2

## 2020-01-24 MED ORDER — VITAL HIGH PROTEIN PO LIQD
1000.0000 mL | ORAL | Status: DC
  Administered 2020-01-24: 1000 mL

## 2020-01-24 MED ORDER — NOREPINEPHRINE 4 MG/250ML-% IV SOLN: 2.0000 ug/min | INTRAVENOUS | Status: DC

## 2020-01-24 MED ORDER — LACTATED RINGERS IV BOLUS
1000.0000 mL | Freq: Once | INTRAVENOUS | Status: AC
  Administered 2020-01-24: 1000 mL via INTRAVENOUS

## 2020-01-24 MED ORDER — MELATONIN 5 MG PO TABS
5.0000 mg | ORAL_TABLET | Freq: Every day | ORAL | Status: DC
  Administered 2020-01-24 – 2020-02-10 (×13): 5 mg
  Filled 2020-01-24 (×15): qty 1

## 2020-01-24 MED ORDER — NOREPINEPHRINE 4 MG/250ML-% IV SOLN
INTRAVENOUS | Status: AC
  Administered 2020-01-24: 2 ug/min via INTRAVENOUS
  Filled 2020-01-24: qty 250

## 2020-01-24 MED ORDER — MAGNESIUM SULFATE 4 GM/100ML IV SOLN
4.0000 g | Freq: Once | INTRAVENOUS | Status: AC
  Administered 2020-01-24: 4 g via INTRAVENOUS
  Filled 2020-01-24: qty 100

## 2020-01-24 MED ORDER — POTASSIUM CHLORIDE 10 MEQ/100ML IV SOLN
10.0000 meq | INTRAVENOUS | Status: AC
  Administered 2020-01-24 (×3): 10 meq via INTRAVENOUS
  Filled 2020-01-24 (×3): qty 100

## 2020-01-24 MED ORDER — POTASSIUM CHLORIDE 2 MEQ/ML IV SOLN
INTRAVENOUS | Status: DC
  Filled 2020-01-24 (×3): qty 1000

## 2020-01-24 NOTE — Plan of Care (Signed)

## 2020-01-24 NOTE — Consult Note (Signed)
[facility] for Electrolyte Monitoring and Replacement   Recent Labs: Potassium (mmol/L)  Date Value  01/24/2020 3.4 (L)  12/17/2011 4.1   Magnesium (mg/dL)  Date Value  01/24/2020 1.3 (L)  12/17/2011 2.2   Calcium (mg/dL)  Date Value  01/24/2020 7.7 (L)   Calcium, Total (mg/dL)  Date Value  12/17/2011 8.8   Albumin (g/dL)  Date Value  01/23/2020 4.0  08/28/2016 4.3  12/16/2011 4.1   Phosphorus (mg/dL)  Date Value  01/24/2020 1.5 (L)  12/16/2011 2.2 (L)   Sodium (mmol/L)  Date Value  01/24/2020 140  08/28/2016 143  12/17/2011 139    Assessment: Patient is a 79 y/o F with medical history including history of breast cancer, diabetes, GERD, tracheal stenosis s/p tracheostomy who presented to the ED 11/27 with unresponsiveness. Patient was recently admitted 10/27 thru 11/19 with altered mental status and pneumonia. Pharmacy has been consulted to assist with monitoring and repletion of electrolytes as indicated. Possible seizure activity noted this am  Patient is currently NPO. Admission EKG with QTc 498  MIVF: lactated ringers at 50 mL/hr  Goal of Therapy:  Potassium 4.0 - 5.1 mmol/L Magnesium 2.0 - 2.4 mg/dL All other electrolytes within normal limits  Plan:   6 grams total IV magnesium sulfate per NP  45 mmol IV potassium phosphate per NP (this provides 66 mEq IV potassium)  Follow-up electrolytes 1600  Dallie Piles 01/24/2020 10:13 AM

## 2020-01-24 NOTE — Progress Notes (Addendum)
Shift summary:  - Patient on vent via trach.  - Tremulous and twitching.  - Follows commands intermittently during the AM. Level of consciousness improved throughout the day.  - One-time bolus feed of ensure given per NGT.

## 2020-01-24 NOTE — Progress Notes (Signed)
Pt has increase alertness, makes eye contact and is able to follow commands on all fours for only random moments at a time. Lasts about 30 secs to 54min, then returns to lethargic state with minimal response only to pain.

## 2020-01-24 NOTE — Progress Notes (Signed)
GOALS OF CARE DISCUSSION  The Clinical status was relayed to family in detail-Husband at bedside.  Updated and notified of patients medical condition.  Patient is having a weak cough and struggling to remove secretions.   patient with increased WOB and using accessory muscles to breathe Explained to family course of therapy and the modalities  Patient placed on vent chronic Trach   Family understands the situation. Patient will need PEG tube-will consult Dr Glade Lloyd) Patient with h/o hernia.   Family are satisfied with Plan of action and management. All questions answered  Additional CC time 32 mins   Joziah Dollins Patricia Pesa, M.D.  [facility]  Medical Director [place] Director [facility] Cardio-Pulmonary Department

## 2020-01-24 NOTE — Plan of Care (Signed)

## 2020-01-24 NOTE — Progress Notes (Signed)
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    History of present illness   79 yo female presented to the emergency department from home because of family concerns regarding worsening altered mental status.  Upon arrival to the ED patient was unable to give any history due to lethargy.  She had a recent admission at [facility] for altered mental status and suspected aspiration pneumonia.  The patient was discharged 2 days ago on 01/21/2020 and family reported via ED documentation that the patient has undergone a drastic mental status change.  ED work-up included a CT head which was negative for any acute abnormality. ABG revealed respiratory acidosis with hypoxia and hypercarbia and lab work showed leukocytosis.  Initial lactic acid was unimpressive at 1.0, repeat 1.9, cultures and procalcitonin are pending.  Chest x-ray shows no significant interval change, with hazy lung markings and unchanged potential opacity vs small left lower lobe pleural effusion. PCCM team consulted for further management and monitoring with admission to the ICU.  Past Medical History  Tracheal stenosis s/p tracheostomy Breast Cancer - 2002 T2DM HLD Glaucoma GERD HTN  SIGNIFICANT EVENTS 01/23/20 AMS in ED, hypercarbic- placed on ventilator > Admit to ICU 11/28 remains on vent, critically ill   CC  follow up respiratory failure  SUBJECTIVE Patient remains critically ill Prognosis is guarded On vent S/p trach  Vent Mode: PRVC FiO2 (%):  [28 %-35 %] 28 % Set Rate:  [18 bmp] 18 bmp Vt Set:  [400 mL] 400 mL PEEP:  [5 cmH20] 5 cmH20 Plateau Pressure:  [17 cmH20] 17 cmH20  CBC    Component Value Date/Time   WBC 12.4 (H) 01/24/2020 0230   RBC 4.47 01/24/2020 0230   HGB 11.7 (L) 01/24/2020 0230   HGB 14.4 12/21/2015 1040   HCT 36.3 01/24/2020 0230   HCT 42.4  12/21/2015 1040   PLT 236 01/24/2020 0230   PLT 241 12/21/2015 1040   MCV 81.2 01/24/2020 0230   MCV 87 12/21/2015 1040   MCV 82 12/17/2011 0650   MCH 26.2 01/24/2020 0230   MCHC 32.2 01/24/2020 0230   RDW 14.3 01/24/2020 0230   RDW 12.5 12/21/2015 1040   RDW 13.3 12/17/2011 0650   LYMPHSABS 1.3 01/23/2020 2027   LYMPHSABS 1.1 12/21/2015 1040   LYMPHSABS 0.9 (L) 12/17/2011 0650   MONOABS 0.5 01/23/2020 2027   MONOABS 0.1 (L) 12/17/2011 0650   EOSABS 0.0 01/23/2020 2027   EOSABS 0.1 12/21/2015 1040   EOSABS 0.0 12/17/2011 0650   BASOSABS 0.0 01/23/2020 2027   BASOSABS 0.0 12/21/2015 1040   BASOSABS 0.0 12/17/2011 0650   CMP Latest Ref Rng & Units 01/24/2020 01/23/2020 01/20/2020  Glucose 70 - 99 mg/dL 122(H) 276(H) 116(H)  BUN 8 - 23 mg/dL _0 Creatinine 0.44 - 1.00 mg/dL 0.64 0.70 0.71  Sodium 135 - 145 mmol/L 140 139 143  Potassium 3.5 - 5.1 mmol/L 3.4(L) 3.4(L) 3.0(L)  Chloride 98 - 111 mmol/L 105 102 107  CO2 22 - 32 mmol/L _1 Calcium 8.9 - 10.3 mg/dL 7.7(L) 8.7(L) 7.9(L)  Total Protein 6.5 - 8.1 g/dL - 8.0 -  Total Bilirubin 0.3 - 1.2 mg/dL - 0.6 -  Alkaline Phos 38 - 126 U/L - 69 -  AST 15 - 41 U/L - 22 -  ALT 0 - 44 U/L - 25 -    BP  99/73   Pulse 87   Temp 100 F (37.8 C)   Resp (!) 22   Wt 74.6 kg   SpO2 100%   BMI 28.23 kg/m    I/O last 3 completed shifts: In: 3734.8 [I.V.:873.7; IV Piggyback:2861.1] Out: 600 [Urine:600] No intake/output data recorded.  SpO2: 100 % O2 Flow Rate (L/min): 8 L/min FiO2 (%): 28 %  Estimated body mass index is 28.23 kg/m as calculated from the following:   Height as of 01/15/20: _0  (1.626 m).   Weight as of this encounter: 74.6 kg.     REVIEW OF SYSTEMS  PATIENT IS UNABLE TO PROVIDE COMPLETE REVIEW OF SYSTEMS DUE TO SEVERE CRITICAL ILLNESS        PHYSICAL EXAMINATION:  GENERAL:critically ill appearing, +resp distress HEAD: Normocephalic, atraumatic.  EYES: Pupils equal, round, reactive  to light.  No scleral icterus.  MOUTH: Moist mucosal membrane. NECK: Supple. S/p trach PULMONARY: +rhonchi, +wheezing CARDIOVASCULAR: S1 and S2. Regular rate and rhythm. No murmurs, rubs, or gallops.  GASTROINTESTINAL: Soft, nontender, -distended.  Positive bowel sounds.   MUSCULOSKELETAL: No swelling, clubbing, or edema.  NEUROLOGIC: obtunded, GCS<8 SKIN:intact,warm,dry  MEDICATIONS: I have reviewed all medications and confirmed regimen as documented   CULTURE RESULTS   Recent Results (from the past 240 hour(s))  Resp Panel by RT-PCR (Flu A&B, Covid) Nasopharyngeal Swab     Status: None   Collection Time: 01/15/20  5:11 PM   Specimen: Nasopharyngeal Swab; Nasopharyngeal(NP) swabs in vial transport medium  Result Value Ref Range Status   SARS Coronavirus 2 by RT PCR NEGATIVE NEGATIVE Final    Comment: (NOTE) SARS-CoV-2 target nucleic acids are NOT DETECTED.  The SARS-CoV-2 RNA is generally detectable in upper respiratory specimens during the acute phase of infection. The lowest concentration of SARS-CoV-2 viral copies this assay can detect is 138 copies/mL. A negative result does not preclude SARS-Cov-2 infection and should not be used as the sole basis for treatment or other patient management decisions. A negative result may occur with  improper specimen collection/handling, submission of specimen other than nasopharyngeal swab, presence of viral mutation(s) within the areas targeted by this assay, and inadequate number of viral copies(<138 copies/mL). A negative result must be combined with clinical observations, patient history, and epidemiological information. The expected result is Negative.  Fact Sheet for Patients:  EntrepreneurPulse.com.au  Fact Sheet for Healthcare Providers:  IncredibleEmployment.be  This test is no t yet approved or cleared by the [place] FDA and  has been authorized for detection and/or diagnosis of  SARS-CoV-2 by FDA under an Emergency Use Authorization (EUA). This EUA will remain  in effect (meaning this test can be used) for the duration of the COVID-19 declaration under Section 564(b)(1) of the Act, 21 U.S.C.section 360bbb-3(b)(1), unless the authorization is terminated  or revoked sooner.       Influenza A by PCR NEGATIVE NEGATIVE Final   Influenza B by PCR NEGATIVE NEGATIVE Final    Comment: (NOTE) The Xpert Xpress SARS-CoV-2/FLU/RSV plus assay is intended as an aid in the diagnosis of influenza from Nasopharyngeal swab specimens and should not be used as a sole basis for treatment. Nasal washings and aspirates are unacceptable for Xpert Xpress SARS-CoV-2/FLU/RSV testing.  Fact Sheet for Patients: EntrepreneurPulse.com.au  Fact Sheet for Healthcare Providers: IncredibleEmployment.be  This test is not yet approved or cleared by the [place] FDA and has been authorized for detection and/or diagnosis of SARS-CoV-2 by FDA under an Emergency Use Authorization (EUA). This EUA will  remain in effect (meaning this test can be used) for the duration of the COVID-19 declaration under Section 564(b)(1) of the Act, 21 U.S.C. section 360bbb-3(b)(1), unless the authorization is terminated or revoked.  Performed at [facility], [address] [place], [place] [address]   Culture, blood (single) w Reflex to ID Panel     Status: None   Collection Time: 01/17/20 12:48 PM   Specimen: BLOOD  Result Value Ref Range Status   Specimen Description BLOOD BLLA  Final   Special Requests   Final    BOTTLES DRAWN AEROBIC AND ANAEROBIC Blood Culture adequate volume   Culture   Final    NO GROWTH 5 DAYS Performed at [facility], [address] [place], [place] [address]    Report Status 01/22/2020 FINAL  Final  Expectorated sputum assessment w rflx to resp cult     Status: None   Collection Time: 01/17/20  3:26 PM   Specimen:  Sputum  Result Value Ref Range Status   Specimen Description   Final    SPUTUM Performed at [facility], [address], [place], [place] [address]    Special Requests   Final    NONE Performed at [facility], [address], [place], [place] [address]    Sputum evaluation   Final    THIS SPECIMEN IS ACCEPTABLE FOR SPUTUM CULTURE Performed at [facility], [place] [address], [place], [place] [address]    Report Status 01/17/2020 FINAL  Final  Culture, respiratory     Status: None   Collection Time: 01/17/20  3:26 PM   Specimen: SPU  Result Value Ref Range Status   Specimen Description SPUTUM  Final   Special Requests NONE Reflexed from [id number]  Final   Gram Stain   Final    MODERATE WBC PRESENT, PREDOMINANTLY PMN FEW GRAM POSITIVE RODS FEW GRAM NEGATIVE RODS FEW GRAM POSITIVE COCCI IN PAIRS IN CLUSTERS    Culture   Final    Normal respiratory flora-no Staph aureus or Pseudomonas seen Performed at [facility], [address], [place], [place] [address]    Report Status 01/19/2020 FINAL  Final  MRSA PCR Screening     Status: None   Collection Time: 01/17/20  5:00 PM   Specimen: Nasopharyngeal  Result Value Ref Range Status   MRSA by PCR NEGATIVE NEGATIVE Final    Comment:        The GeneXpert MRSA Assay (FDA approved for NASAL specimens only), is one component of a comprehensive MRSA colonization surveillance program. It is not intended to diagnose MRSA infection nor to guide or monitor treatment for MRSA infections. Performed at [facility], [address] [place], [place] [address]   Blood culture (routine single)     Status: None (Preliminary result)   Collection Time: 01/23/20  8:27 PM   Specimen: BLOOD  Result Value Ref Range Status   Specimen Description BLOOD BLOOD LEFT HAND  Final   Special Requests   Final    BOTTLES DRAWN AEROBIC AND ANAEROBIC Blood Culture adequate volume   Culture   Final    NO GROWTH < 12  HOURS Performed at [facility], [address] [place], [place] [address]    Report Status PENDING  Incomplete  Resp Panel by RT-PCR (Flu A&B, Covid) Nasopharyngeal Swab     Status: None   Collection Time: 01/23/20  8:27 PM   Specimen: Nasopharyngeal Swab; Nasopharyngeal(NP) swabs in  vial transport medium  Result Value Ref Range Status   SARS Coronavirus 2 by RT PCR NEGATIVE NEGATIVE Final    Comment: (NOTE) SARS-CoV-2 target nucleic acids are NOT DETECTED.  The SARS-CoV-2 RNA is generally detectable in upper respiratory specimens during the acute phase of infection. The lowest concentration of SARS-CoV-2 viral copies this assay can detect is 138 copies/mL. A negative result does not preclude SARS-Cov-2 infection and should not be used as the sole basis for treatment or other patient management decisions. A negative result may occur with  improper specimen collection/handling, submission of specimen other than nasopharyngeal swab, presence of viral mutation(s) within the areas targeted by this assay, and inadequate number of viral copies(<138 copies/mL). A negative result must be combined with clinical observations, patient history, and epidemiological information. The expected result is Negative.  Fact Sheet for Patients:  EntrepreneurPulse.com.au  Fact Sheet for Healthcare Providers:  IncredibleEmployment.be  This test is no t yet approved or cleared by the [place] FDA and  has been authorized for detection and/or diagnosis of SARS-CoV-2 by FDA under an Emergency Use Authorization (EUA). This EUA will remain  in effect (meaning this test can be used) for the duration of the COVID-19 declaration under Section 564(b)(1) of the Act, 21 U.S.C.section 360bbb-3(b)(1), unless the authorization is terminated  or revoked sooner.       Influenza A by PCR NEGATIVE NEGATIVE Final   Influenza B by PCR NEGATIVE NEGATIVE Final     Comment: (NOTE) The Xpert Xpress SARS-CoV-2/FLU/RSV plus assay is intended as an aid in the diagnosis of influenza from Nasopharyngeal swab specimens and should not be used as a sole basis for treatment. Nasal washings and aspirates are unacceptable for Xpert Xpress SARS-CoV-2/FLU/RSV testing.  Fact Sheet for Patients: EntrepreneurPulse.com.au  Fact Sheet for Healthcare Providers: IncredibleEmployment.be  This test is not yet approved or cleared by the [place] FDA and has been authorized for detection and/or diagnosis of SARS-CoV-2 by FDA under an Emergency Use Authorization (EUA). This EUA will remain in effect (meaning this test can be used) for the duration of the COVID-19 declaration under Section 564(b)(1) of the Act, 21 U.S.C. section 360bbb-3(b)(1), unless the authorization is terminated or revoked.  Performed at [facility], [address] [place], [place] [address]   Culture, blood (single)     Status: None (Preliminary result)   Collection Time: 01/23/20 10:05 PM   Specimen: BLOOD  Result Value Ref Range Status   Specimen Description BLOOD BLOOD LEFT FOREARM  Final   Special Requests   Final    BOTTLES DRAWN AEROBIC ONLY Blood Culture results may not be optimal due to an inadequate volume of blood received in culture bottles   Culture   Final    NO GROWTH < 12 HOURS Performed at [facility], [address] [place], [place] [address]    Report Status PENDING  Incomplete  MRSA PCR Screening     Status: None   Collection Time: 01/24/20  1:00 AM   Specimen: Nasopharyngeal  Result Value Ref Range Status   MRSA by PCR NEGATIVE NEGATIVE Final    Comment:        The GeneXpert MRSA Assay (FDA approved for NASAL specimens only), is one component of a comprehensive MRSA colonization surveillance program. It is not intended to diagnose MRSA infection nor to guide or monitor treatment for MRSA  infections. Performed at [facility], [address], [place], [place] [address]  IMAGING    DG Abd 1 View  Result Date: 01/24/2020 CLINICAL DATA:  OG tube placement EXAM: ABDOMEN - 1 VIEW COMPARISON:  11/19/2010 FINDINGS: The OG tube projects over the gastric body. The bowel gas pattern is nonobstructive. IMPRESSION: OG tube projects over the gastric body. Electronically Signed   By: Constance Holster M.D.   On: 01/24/2020 01:12   CT Head Wo Contrast  Result Date: 01/23/2020 CLINICAL DATA:  Altered mental status. EXAM: CT HEAD WITHOUT CONTRAST TECHNIQUE: Contiguous axial images were obtained from the base of the skull through the vertex without intravenous contrast. COMPARISON:  January 15, 2020 FINDINGS: Brain: There is mild cerebral atrophy with widening of the extra-axial spaces and ventricular dilatation. There are areas of decreased attenuation within the white matter tracts of the supratentorial brain, consistent with microvascular disease changes. Vascular: No hyperdense vessel or unexpected calcification. Skull: Normal. Negative for fracture or focal lesion. Sinuses/Orbits: No acute finding. Other: None. IMPRESSION: 1. Generalized cerebral atrophy. 2. No acute intracranial abnormality. Electronically Signed   By: Virgina Norfolk M.D.   On: 01/23/2020 21:05   DG Chest Port 1 View  Result Date: 01/23/2020 CLINICAL DATA:  Sepsis. EXAM: PORTABLE CHEST 1 VIEW COMPARISON:  January 18, 2020 FINDINGS: The tracheostomy tube is stable in positioning. The heart size is unchanged. Hazy coarse lung markings are again noted. Areas of fibrosis and scarring are noted at the lung apices. There is elevation of the right hemidiaphragm. Aortic calcifications are noted. IMPRESSION: No significant interval change. Electronically Signed   By: Constance Holster M.D.   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    Noncompliant with Vent for last 2 years, signs and symptoms of aspiration Will likely need PEG tube  Severe ACUTE Hypoxic and Hypercapnic Respiratory Failure -continue Full MV support -continue Bronchodilator Therapy -Wean Fio2 and PEEP as tolerated -will perform SAT/SBT when respiratory parameters are met -VAP/VENT bundle implementation  ACUTE DIASTOLIC CARDIAC FAILURE- -oxygen as needed -Lasix as tolerated    NEUROLOGY Acute toxic metabolic encephalopathy from hypoxia and hypercapnia   CARDIAC ICU monitoring  GI GI PROPHYLAXIS as indicated  NUTRITIONAL STATUS Nutrition Status:         DIET-->NPO Constipation protocol as indicated NG in place  ENDO - will use ICU hypoglycemic\Hyperglycemia protocol if indicated     ELECTROLYTES -follow labs as needed -replace as needed -pharmacy consultation and following   DVT/GI PRX ordered and assessed TRANSFUSIONS AS NEEDED MONITOR FSBS I Assessed the need for Labs I Assessed the need for Foley I Assessed the need for Central Venous Line Family Discussion when available I Assessed the need for Mobilization I made an Assessment of medications to be adjusted accordingly Safety Risk assessment completed   CASE DISCUSSED IN MULTIDISCIPLINARY ROUNDS WITH ICU TEAM  Critical Care Time devoted to patient care services described in this note is 45  minutes.   Overall, patient is critically ill, prognosis is guarded.  Recommend DNR status, recommend  PEG tube   Corrin Parker,  M.D.  [facility]  Medical Director [place] Director [facility] Cardio-Pulmonary Department

## 2020-01-24 NOTE — Consult Note (Signed)
[place] for Electrolyte Monitoring and Replacement   Recent Labs: Potassium (mmol/L)  Date Value  01/24/2020 3.2 (L)  12/17/2011 4.1   Magnesium (mg/dL)  Date Value  01/24/2020 3.0 (H)  12/17/2011 2.2   Calcium (mg/dL)  Date Value  01/24/2020 7.7 (L)   Calcium, Total (mg/dL)  Date Value  12/17/2011 8.8   Albumin (g/dL)  Date Value  01/23/2020 4.0  08/28/2016 4.3  12/16/2011 4.1   Phosphorus (mg/dL)  Date Value  01/24/2020 3.1  12/16/2011 2.2 (L)   Sodium (mmol/L)  Date Value  01/24/2020 140  08/28/2016 143  12/17/2011 139    Assessment: Patient is a 79 y/o F with medical history including history of breast cancer, diabetes, GERD, tracheal stenosis s/p tracheostomy who presented to the ED 11/27 with unresponsiveness. Patient was recently admitted 10/27 thru 11/19 with altered mental status and pneumonia. Pharmacy has been consulted to assist with monitoring and repletion of electrolytes as indicated. Possible seizure activity noted this am  Patient is currently NPO. Admission EKG with QTc 498  MIVF: lactated ringers at 50 mL/hr  Goal of Therapy:  Potassium 4.0 - 5.1 mmol/L Magnesium 2.0 - 2.4 mg/dL All other electrolytes within normal limits  Plan:   11/28 PM: K 3.2, Mg 3, Phos 3.1  Will modify MIVF to LR + 40 mEq K/L to continue at rate of 50 mL/hr (48 mEq / day)  Will order additional IV KCl 10 mEq x 3  Follow-up electrolytes with AM labs  Benita Gutter 01/24/2020 5:12 PM

## 2020-01-24 NOTE — Progress Notes (Signed)
Focal seizure noted, NP made aware and awaiting EEG. Magnesium sulfate 4g given, followed by 2g, seizure precautions initiated.

## 2020-01-24 NOTE — Progress Notes (Signed)
02:00 called bedside by care RN - possible partial seizure activity.  Intermittent, self-sustained tremors/minor jerks involving the mouth, forehead, shoulders. No vital sign changes, no post-ictal state noted but challenging to assess due to current altered state. CT head on admission negative, MRI brain on 11/20 negative except for chronic microvascular changes. - EEG ordered  Baseline PCT < 0.10, repeat with AM labs also negative at < 0.10. - Antibiotic coverage discontinued for now due to unlikelihood of bacterial infection. - F/u with cultures, and repeat PCT ordered for 01/25/20   Hypotension in the setting of poor PO intake over the last 2 days per husband - additional LR bolus ordered, peripheral levophed ordered   Domingo Pulse [place], AGACNP-BC Acute Care Nurse Practitioner [facility]   [phone] / [phone] Please see Amion for pager details.

## 2020-01-25 ENCOUNTER — Inpatient Hospital Stay: Payer: Medicare Other

## 2020-01-25 LAB — MAGNESIUM
Magnesium: 2.5 mg/dL — ABNORMAL HIGH (ref 1.7–2.4)
Magnesium: 2.4 mg/dL (ref 1.7–2.4)

## 2020-01-25 LAB — CBC
HCT: 31 % — ABNORMAL LOW (ref 36.0–46.0)
Hemoglobin: 9.6 g/dL — ABNORMAL LOW (ref 12.0–15.0)
MCH: 26.4 pg (ref 26.0–34.0)
MCHC: 31 g/dL (ref 30.0–36.0)
MCV: 85.2 fL (ref 80.0–100.0)
Platelets: 147 10*3/uL — ABNORMAL LOW (ref 150–400)
RBC: 3.64 MIL/uL — ABNORMAL LOW (ref 3.87–5.11)
RDW: 14.5 % (ref 11.5–15.5)
WBC: 7 10*3/uL (ref 4.0–10.5)
nRBC: 0 % (ref 0.0–0.2)

## 2020-01-25 LAB — URINE CULTURE: Culture: NO GROWTH

## 2020-01-25 LAB — BASIC METABOLIC PANEL
Anion gap: 7 (ref 5–15)
BUN: 10 mg/dL (ref 8–23)
CO2: 25 mmol/L (ref 22–32)
Calcium: 8 mg/dL — ABNORMAL LOW (ref 8.9–10.3)
Chloride: 108 mmol/L (ref 98–111)
Creatinine, Ser: 0.84 mg/dL (ref 0.44–1.00)
GFR, Estimated: >60 mL/min (ref >60)
Glucose, Bld: 106 mg/dL — ABNORMAL HIGH (ref 70–99)
Potassium: 3.7 mmol/L (ref 3.5–5.1)
Sodium: 140 mmol/L (ref 135–145)

## 2020-01-25 LAB — GLUCOSE, CAPILLARY
Glucose-Capillary: 103 mg/dL — ABNORMAL HIGH (ref 70–99)
Glucose-Capillary: 104 mg/dL — ABNORMAL HIGH (ref 70–99)
Glucose-Capillary: 107 mg/dL — ABNORMAL HIGH (ref 70–99)
Glucose-Capillary: 129 mg/dL — ABNORMAL HIGH (ref 70–99)
Glucose-Capillary: 141 mg/dL — ABNORMAL HIGH (ref 70–99)
Glucose-Capillary: 148 mg/dL — ABNORMAL HIGH (ref 70–99)
Glucose-Capillary: 175 mg/dL — ABNORMAL HIGH (ref 70–99)
Glucose-Capillary: 94 mg/dL (ref 70–99)

## 2020-01-25 LAB — PHOSPHORUS
Phosphorus: 2 mg/dL — ABNORMAL LOW (ref 2.5–4.6)
Phosphorus: 2 mg/dL — ABNORMAL LOW (ref 2.5–4.6)

## 2020-01-25 LAB — PROCALCITONIN: Procalcitonin: <0.1 ng/mL

## 2020-01-25 MED ORDER — ADULT MULTIVITAMIN W/MINERALS CH
1.0000 | ORAL_TABLET | Freq: Every day | ORAL | Status: DC
  Administered 2020-01-25 – 2020-02-13 (×16): 1
  Filled 2020-01-25 (×17): qty 1

## 2020-01-25 MED ORDER — POTASSIUM & SODIUM PHOSPHATES 280-160-250 MG PO PACK
2.0000 | PACK | Freq: Three times a day (TID) | ORAL | Status: AC
  Administered 2020-01-25 (×2): 2
  Filled 2020-01-25 (×2): qty 2

## 2020-01-25 MED ORDER — ALLOPURINOL 100 MG PO TABS
100.0000 mg | ORAL_TABLET | Freq: Every day | ORAL | Status: DC
  Administered 2020-01-25 – 2020-02-13 (×16): 100 mg via NASOGASTRIC
  Filled 2020-01-25 (×20): qty 1

## 2020-01-25 MED ORDER — POTASSIUM & SODIUM PHOSPHATES 280-160-250 MG PO PACK
2.0000 | PACK | ORAL | Status: AC
  Administered 2020-01-25 – 2020-01-26 (×2): 2
  Filled 2020-01-25 (×2): qty 2

## 2020-01-25 MED ORDER — POTASSIUM & SODIUM PHOSPHATES 280-160-250 MG PO PACK
2.0000 | PACK | ORAL | Status: DC
  Filled 2020-01-25 (×3): qty 2

## 2020-01-25 MED ORDER — SCOPOLAMINE 1 MG/3DAYS TD PT72
1.0000 | MEDICATED_PATCH | TRANSDERMAL | Status: DC
  Administered 2020-01-25 – 2020-01-31 (×3): 1.5 mg via TRANSDERMAL
  Filled 2020-01-25 (×3): qty 1

## 2020-01-25 MED ORDER — PROSOURCE TF PO LIQD
90.0000 mL | Freq: Two times a day (BID) | ORAL | Status: DC
  Administered 2020-01-25: 90 mL
  Filled 2020-01-25 (×7): qty 90

## 2020-01-25 MED ORDER — GABAPENTIN 250 MG/5ML PO SOLN
600.0000 mg | Freq: Three times a day (TID) | ORAL | Status: DC
  Administered 2020-01-25 – 2020-02-13 (×41): 600 mg
  Filled 2020-01-25 (×68): qty 12

## 2020-01-25 MED ORDER — FOLIC ACID 1 MG PO TABS
1.0000 mg | ORAL_TABLET | Freq: Every day | ORAL | Status: DC
  Administered 2020-01-25 – 2020-02-13 (×16): 1 mg
  Filled 2020-01-25 (×16): qty 1

## 2020-01-25 MED ORDER — VITAL 1.5 CAL PO LIQD
1000.0000 mL | ORAL | Status: DC
  Administered 2020-01-25: 1000 mL

## 2020-01-25 MED ORDER — POTASSIUM CHLORIDE 20 MEQ PO PACK
40.0000 meq | PACK | Freq: Once | ORAL | Status: AC
  Administered 2020-01-25: 40 meq via ORAL
  Filled 2020-01-25: qty 2

## 2020-01-25 MED ORDER — BUSPIRONE HCL 15 MG PO TABS
30.0000 mg | ORAL_TABLET | Freq: Two times a day (BID) | ORAL | Status: DC
  Administered 2020-01-25 – 2020-02-13 (×31): 30 mg via NASOGASTRIC
  Filled 2020-01-25 (×41): qty 2

## 2020-01-25 MED ORDER — PRAVASTATIN SODIUM 20 MG PO TABS
20.0000 mg | ORAL_TABLET | Freq: Every day | ORAL | Status: DC
  Administered 2020-01-25 – 2020-02-12 (×15): 20 mg via NASOGASTRIC
  Filled 2020-01-25 (×16): qty 1

## 2020-01-25 MED ORDER — ESCITALOPRAM OXALATE 10 MG PO TABS
30.0000 mg | ORAL_TABLET | Freq: Every day | ORAL | Status: DC
  Administered 2020-01-25 – 2020-02-13 (×16): 30 mg
  Filled 2020-01-25 (×20): qty 3

## 2020-01-25 NOTE — Consult Note (Addendum)
[facility] for Electrolyte Monitoring and Replacement   Recent Labs: Potassium (mmol/L)  Date Value  01/25/2020 3.7  12/17/2011 4.1   Magnesium (mg/dL)  Date Value  01/25/2020 2.4  12/17/2011 2.2   Calcium (mg/dL)  Date Value  01/25/2020 8.0 (L)   Calcium, Total (mg/dL)  Date Value  12/17/2011 8.8   Albumin (g/dL)  Date Value  01/23/2020 4.0  08/28/2016 4.3  12/16/2011 4.1   Phosphorus (mg/dL)  Date Value  01/25/2020 2.0 (L)  12/16/2011 2.2 (L)   Sodium (mmol/L)  Date Value  01/25/2020 140  08/28/2016 143  12/17/2011 139    Assessment: Patient is a 79 y/o F with medical history including history of breast cancer, diabetes, GERD, tracheal stenosis s/p tracheostomy who presented to the ED 11/27 with unresponsiveness. Patient was recently admitted 10/27 thru 11/19 with altered mental status and pneumonia. Pharmacy has been consulted to assist with monitoring and repletion of electrolytes as indicated.    Goal of Therapy:  Potassium 4.0 - 5.1 mmol/L Magnesium 2.0 - 2.4 mg/dL All other electrolytes within normal limits  Plan:  11/29 @ 1722  Phos=2.0  Mag= 2.4  Phos-Nak 2 packets per tube q4h x 2 (each packet contains 250 mg elemental phosphorus)- pt appears to have only received 1 po dose prior to lab draw  Follow-up electrolytes with AM labs  Dorsey Charette A 01/25/2020 5:55 PM

## 2020-01-25 NOTE — Progress Notes (Addendum)
Initial Nutrition Assessment  DOCUMENTATION CODES:   Not applicable  INTERVENTION:  Initiate Vital 1.5 Cal at 40 mL/hr (960 mL goal daily volume) + PROSource 90 mL BID per tube. Provides 1600 kcal, 109 grams of protein, 730 mL H2O daily.  Provide MVI daily per tube.   Monitor magnesium, potassium, and phosphorus daily for at least 3 days, MD to replete as needed, as pt is at risk for refeeding syndrome.  NUTRITION DIAGNOSIS:   Inadequate oral intake related to inability to eat as evidenced by NPO status.  GOAL:   Patient will meet greater than or equal to 90% of their needs  MONITOR:   Vent status, Labs, Weight trends, TF tolerance, I & O's  REASON FOR ASSESSMENT:   Ventilator, Consult Enteral/tube feeding initiation and management  ASSESSMENT:   79 year old female with PMHx of breast cancer s/p lumpectomy and chemo/XRT, DM, diabetic peripheral neuropathy, GERD, vocal cord paralysis, tracheal stenosis requiring chronic tracheostomy, recent admission 11/19-11/25 admitted with acute hypoxic and hypercapnic respiratory failure secondary to suspected aspiration PNA, acute encephalopathy.   11/27 placed on ventilator support via tracheostomy 11/28 adult TF protocol initiated  Patient is currently intubated on ventilator support MV: 9.1 L/min Temp (24hrs), Avg:100.3 F (37.9 C), Min:99.5 F (37.5 C), Max:101.1 F (38.4 C)  Medications reviewed and include: allopurinol, folic acid 1 mg daily per tube, Protonix, potassium and sodium phosphates, LR at 50 mL/hr.  Labs reviewed: CBG 103-129, Phosphorus 2, Magnesium 2.5.  I/O: 520 mL UOP Yesterday (0.3 mL/kg/hr)  Weight trend: pt was 154.8 lbs on 11/24/2018, 72 kg (158.73 lbs) on 01/17/2020, and is currently documented to be 74.6 kg (164.46 lbs)  Enteral Access: 14 Fr. NGT placed 11/28; terminates in gastric body per abdominal x-ray 11/28  Patient sleeping at time of RD assessment. Tube feeds infusing per NGT. Palliative  Medicine has been consulted to discuss possible PEG tube placement. Unable to utilize Vital AF 1.2 Cal due to documented shellfish allergy.  TF regimen: Vital High Protein at 40 mL/hr + PROSource TF 45 mL BID  Discussed with RN.  NUTRITION - FOCUSED PHYSICAL EXAM:    Most Recent Value  Orbital Region No depletion  Upper Arm Region Mild depletion  Thoracic and Lumbar Region No depletion  Buccal Region No depletion  Temple Region No depletion  Clavicle Bone Region Mild depletion  Clavicle and Acromion Bone Region Mild depletion  Scapular Bone Region Unable to assess  Dorsal Hand Mild depletion  Patellar Region No depletion  Anterior Thigh Region No depletion  Posterior Calf Region No depletion  Edema (RD Assessment) None  Hair Reviewed  Eyes Unable to assess  Mouth Unable to assess  Skin Reviewed  Nails Reviewed     Diet Order:   Diet Order            Diet NPO time specified  Diet effective now                EDUCATION NEEDS:   No education needs have been identified at this time  Skin:  Skin Assessment: Reviewed RN Assessment  Last BM:  01/23/2020 per chart  Height:   Ht Readings from Last 1 Encounters:  01/15/20 5\' 4"  (1.626 m)   Weight:   Wt Readings from Last 1 Encounters:  01/24/20 74.6 kg   Ideal Body Weight:  54.5 kg  BMI:  Body mass index is 28.23 kg/m.  Estimated Nutritional Needs:   Kcal:  4010 (PSU 2003b w/ MSJ 1222,  Ve 9.1, Tmax 38.4)  Protein:  95-110 grams  Fluid:  1.8 L/day  [name], MS, RD, LDN Pager number available on Amion

## 2020-01-25 NOTE — Plan of Care (Signed)
Pt now on tube feeding via NG due to evidence of aspiration. Home meds restarted today. Plan for PEG by Dr.Pabon, date of procedure not yet set. Pt did well on spontaneous breathing trial. Now starting trach collar trial.    Problem: Education: Goal: Knowledge of General Education information will improve Description: Including pain rating scale, medication(s)/side effects and non-pharmacologic comfort measures Outcome: Progressing   Problem: Health Behavior/Discharge Planning: Goal: Ability to manage health-related needs will improve Outcome: Progressing   Problem: Clinical Measurements: Goal: Ability to maintain clinical measurements within normal limits will improve Outcome: Progressing Goal: Will remain free from infection Outcome: Progressing Goal: Diagnostic test results will improve Outcome: Progressing Goal: Respiratory complications will improve Outcome: Progressing Goal: Cardiovascular complication will be avoided Outcome: Progressing   Problem: Activity: Goal: Risk for activity intolerance will decrease Outcome: Progressing   Problem: Nutrition: Goal: Adequate nutrition will be maintained Outcome: Progressing   Problem: Coping: Goal: Level of anxiety will decrease Outcome: Progressing   Problem: Elimination: Goal: Will not experience complications related to bowel motility Outcome: Progressing Goal: Will not experience complications related to urinary retention Outcome: Progressing   Problem: Pain Managment: Goal: General experience of comfort will improve Outcome: Progressing   Problem: Safety: Goal: Ability to remain free from injury will improve Outcome: Progressing   Problem: Skin Integrity: Goal: Risk for impaired skin integrity will decrease Outcome: Progressing

## 2020-01-25 NOTE — Consult Note (Signed)
[facility] for Electrolyte Monitoring and Replacement   Recent Labs: Potassium (mmol/L)  Date Value  01/25/2020 3.7  12/17/2011 4.1   Magnesium (mg/dL)  Date Value  01/25/2020 2.5 (H)  12/17/2011 2.2   Calcium (mg/dL)  Date Value  01/25/2020 8.0 (L)   Calcium, Total (mg/dL)  Date Value  12/17/2011 8.8   Albumin (g/dL)  Date Value  01/23/2020 4.0  08/28/2016 4.3  12/16/2011 4.1   Phosphorus (mg/dL)  Date Value  01/25/2020 2.0 (L)  12/16/2011 2.2 (L)   Sodium (mmol/L)  Date Value  01/25/2020 140  08/28/2016 143  12/17/2011 139    Assessment: Patient is a 79 y/o F with medical history including history of breast cancer, diabetes, GERD, tracheal stenosis s/p tracheostomy who presented to the ED 11/27 with unresponsiveness. Patient was recently admitted 10/27 thru 11/19 with altered mental status and pneumonia. Pharmacy has been consulted to assist with monitoring and repletion of electrolytes as indicated.    Goal of Therapy:  Potassium 4.0 - 5.1 mmol/L Magnesium 2.0 - 2.4 mg/dL All other electrolytes within normal limits  Plan:   KCl 40 mEq per tube x 1  continue MIVF LR + 40 mEq KCl/L at rate of 50 mL/hr (48 mEq / day)  Phos-Nak 2 packets x 2 (each packet contains 250 mg elemental phosphorus)  Follow-up electrolytes with AM labs  Angela David 01/25/2020 9:19 AM

## 2020-01-25 NOTE — Progress Notes (Signed)
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    History of present illness   79 yo female presented to the emergency department from home because of family concerns regarding worsening altered mental status.  Upon arrival to the ED patient was unable to give any history due to lethargy.  She had a recent admission at [facility] for altered mental status and suspected aspiration pneumonia.  The patient was discharged 2 days ago on 01/21/2020 and family reported via ED documentation that the patient has undergone a drastic mental status change.  ED work-up included a CT head which was negative for any acute abnormality. ABG revealed respiratory acidosis with hypoxia and hypercarbia and lab work showed leukocytosis.  Initial lactic acid was unimpressive at 1.0, repeat 1.9, cultures and procalcitonin are pending.  Chest x-ray shows no significant interval change, with hazy lung markings and unchanged potential opacity vs small left lower lobe pleural effusion. PCCM team consulted for further management and monitoring with admission to the ICU.  Past Medical History  Tracheal stenosis s/p tracheostomy Breast Cancer - 2002 T2DM HLD Glaucoma GERD HTN  SIGNIFICANT EVENTS 01/23/20 AMS in ED, hypercarbic- placed on ventilator > Admit to ICU 11/28 remains on vent, critically ill   CC  follow up respiratory failure  SUBJECTIVE Patient remains critically ill Prognosis is guarded On vent S/p trach  Vent Mode: PSV FiO2 (%):  [28 %] 28 % Set Rate:  [18 bmp] 18 bmp Vt Set:  [400 mL] 400 mL PEEP:  [5 cmH20] 5 cmH20 Pressure Support:  [10 cmH20] 10 cmH20  CBC    Component Value Date/Time   WBC 7.0 01/25/2020 0640   RBC 3.64 (L) 01/25/2020 0640   HGB 9.6 (L) 01/25/2020 0640   HGB 14.4 12/21/2015 1040   HCT 31.0 (L) 01/25/2020 0640   HCT 42.4 12/21/2015  1040   PLT 147 (L) 01/25/2020 0640   PLT 241 12/21/2015 1040   MCV 85.2 01/25/2020 0640   MCV 87 12/21/2015 1040   MCV 82 12/17/2011 0650   MCH 26.4 01/25/2020 0640   MCHC 31.0 01/25/2020 0640   RDW 14.5 01/25/2020 0640   RDW 12.5 12/21/2015 1040   RDW 13.3 12/17/2011 0650   LYMPHSABS 1.3 01/23/2020 2027   LYMPHSABS 1.1 12/21/2015 1040   LYMPHSABS 0.9 (L) 12/17/2011 0650   MONOABS 0.5 01/23/2020 2027   MONOABS 0.1 (L) 12/17/2011 0650   EOSABS 0.0 01/23/2020 2027   EOSABS 0.1 12/21/2015 1040   EOSABS 0.0 12/17/2011 0650   BASOSABS 0.0 01/23/2020 2027   BASOSABS 0.0 12/21/2015 1040   BASOSABS 0.0 12/17/2011 0650   CMP Latest Ref Rng & Units 01/25/2020 01/24/2020 01/24/2020  Glucose 70 - 99 mg/dL 106(H) - 122(H)  BUN 8 - 23 mg/dL 10 - 14  Creatinine 0.44 - 1.00 mg/dL 0.84 - 0.64  Sodium 135 - 145 mmol/L 140 - 140  Potassium 3.5 - 5.1 mmol/L 3.7 3.2(L) 3.4(L)  Chloride 98 - 111 mmol/L 108 - 105  CO2 22 - 32 mmol/L 25 - 23  Calcium 8.9 - 10.3 mg/dL 8.0(L) - 7.7(L)  Total Protein 6.5 - 8.1 g/dL - - -  Total Bilirubin 0.3 - 1.2 mg/dL - - -  Alkaline Phos 38 - 126 U/L - - -  AST 15 - 41 U/L - - -  ALT 0 - 44 U/L - - -  BP 119/76   Pulse 76   Temp 99.9 F (37.7 C)   Resp (!) 24   Wt 74.6 kg   SpO2 100%   BMI 28.23 kg/m    I/O last 3 completed shifts: In: 4627 [I.V.:1811.4; NG/GT:290; IV Piggyback:3152.7] Out: 1270 [Urine:1220; Emesis/NG output:50] Total I/O In: 1101.3 [I.V.:400; NG/GT:701.3] Out: 410 [Urine:410]  SpO2: 100 % O2 Flow Rate (L/min): 8 L/min FiO2 (%): 28 %  Estimated body mass index is 28.23 kg/m as calculated from the following:   Height as of 01/15/20: '5\' 4"'  (1.626 m).   Weight as of this encounter: 74.6 kg.     REVIEW OF SYSTEMS  PATIENT IS UNABLE TO PROVIDE COMPLETE REVIEW OF SYSTEMS DUE TO SEVERE CRITICAL ILLNESS        PHYSICAL EXAMINATION:  GENERAL:critically ill appearing, +resp distress HEAD: Normocephalic, atraumatic.   EYES: Pupils equal, round, reactive to light.  No scleral icterus.  MOUTH: Moist mucosal membrane. NECK: Supple. S/p trach PULMONARY: +rhonchi, CARDIOVASCULAR: S1 and S2. Regular rate and rhythm. No murmurs, rubs, or gallops.  GASTROINTESTINAL: Soft, nontender, -distended.  Positive bowel sounds.   MUSCULOSKELETAL: No swelling, clubbing, or edema.  NEUROLOGIC: no FND grossly SKIN:intact,warm,dry  MEDICATIONS: I have reviewed all medications and confirmed regimen as documented   CULTURE RESULTS   Recent Results (from the past 240 hour(s))  Resp Panel by RT-PCR (Flu A&B, Covid) Nasopharyngeal Swab     Status: None   Collection Time: 01/15/20  5:11 PM   Specimen: Nasopharyngeal Swab; Nasopharyngeal(NP) swabs in vial transport medium  Result Value Ref Range Status   SARS Coronavirus 2 by RT PCR NEGATIVE NEGATIVE Final    Comment: (NOTE) SARS-CoV-2 target nucleic acids are NOT DETECTED.  The SARS-CoV-2 RNA is generally detectable in upper respiratory specimens during the acute phase of infection. The lowest concentration of SARS-CoV-2 viral copies this assay can detect is 138 copies/mL. A negative result does not preclude SARS-Cov-2 infection and should not be used as the sole basis for treatment or other patient management decisions. A negative result may occur with  improper specimen collection/handling, submission of specimen other than nasopharyngeal swab, presence of viral mutation(s) within the areas targeted by this assay, and inadequate number of viral copies(<138 copies/mL). A negative result must be combined with clinical observations, patient history, and epidemiological information. The expected result is Negative.  Fact Sheet for Patients:  EntrepreneurPulse.com.au  Fact Sheet for Healthcare Providers:  IncredibleEmployment.be  This test is no t yet approved or cleared by the [place] FDA and  has been authorized for  detection and/or diagnosis of SARS-CoV-2 by FDA under an Emergency Use Authorization (EUA). This EUA will remain  in effect (meaning this test can be used) for the duration of the COVID-19 declaration under Section 564(b)(1) of the Act, 21 U.S.C.section 360bbb-3(b)(1), unless the authorization is terminated  or revoked sooner.       Influenza A by PCR NEGATIVE NEGATIVE Final   Influenza B by PCR NEGATIVE NEGATIVE Final    Comment: (NOTE) The Xpert Xpress SARS-CoV-2/FLU/RSV plus assay is intended as an aid in the diagnosis of influenza from Nasopharyngeal swab specimens and should not be used as a sole basis for treatment. Nasal washings and aspirates are unacceptable for Xpert Xpress SARS-CoV-2/FLU/RSV testing.  Fact Sheet for Patients: EntrepreneurPulse.com.au  Fact Sheet for Healthcare Providers: IncredibleEmployment.be  This test is not yet approved or cleared by the [place] FDA and has been authorized for detection and/or diagnosis of SARS-CoV-2 by FDA  under an Emergency Use Authorization (EUA). This EUA will remain in effect (meaning this test can be used) for the duration of the COVID-19 declaration under Section 564(b)(1) of the Act, 21 U.S.C. section 360bbb-3(b)(1), unless the authorization is terminated or revoked.  Performed at [facility], [address] [place], [place] [address]   Culture, blood (single) w Reflex to ID Panel     Status: None   Collection Time: 01/17/20 12:48 PM   Specimen: BLOOD  Result Value Ref Range Status   Specimen Description BLOOD BLLA  Final   Special Requests   Final    BOTTLES DRAWN AEROBIC AND ANAEROBIC Blood Culture adequate volume   Culture   Final    NO GROWTH 5 DAYS Performed at [facility], [address] [place], [place] [address]    Report Status 01/22/2020 FINAL  Final  Expectorated sputum assessment w rflx to resp cult     Status: None   Collection Time:  01/17/20  3:26 PM   Specimen: Sputum  Result Value Ref Range Status   Specimen Description   Final    SPUTUM Performed at [facility], [address] [place], [place] [address]    Special Requests   Final    NONE Performed at [facility], [address] [place], [place] [address]    Sputum evaluation   Final    THIS SPECIMEN IS ACCEPTABLE FOR SPUTUM CULTURE Performed at [facility], [place] [address], [place], [place] [address]    Report Status 01/17/2020 FINAL  Final  Culture, respiratory     Status: None   Collection Time: 01/17/20  3:26 PM   Specimen: SPU  Result Value Ref Range Status   Specimen Description SPUTUM  Final   Special Requests NONE Reflexed from [id number]  Final   Gram Stain   Final    MODERATE WBC PRESENT, PREDOMINANTLY PMN FEW GRAM POSITIVE RODS FEW GRAM NEGATIVE RODS FEW GRAM POSITIVE COCCI IN PAIRS IN CLUSTERS    Culture   Final    Normal respiratory flora-no Staph aureus or Pseudomonas seen Performed at [facility], [address], [place], [place] [address]    Report Status 01/19/2020 FINAL  Final  MRSA PCR Screening     Status: None   Collection Time: 01/17/20  5:00 PM   Specimen: Nasopharyngeal  Result Value Ref Range Status   MRSA by PCR NEGATIVE NEGATIVE Final    Comment:        The GeneXpert MRSA Assay (FDA approved for NASAL specimens only), is one component of a comprehensive MRSA colonization surveillance program. It is not intended to diagnose MRSA infection nor to guide or monitor treatment for MRSA infections. Performed at [facility], [address] [place], [place] [address]   Blood culture (routine single)     Status: None (Preliminary result)   Collection Time: 01/23/20  8:27 PM   Specimen: BLOOD  Result Value Ref Range Status   Specimen Description BLOOD BLOOD LEFT HAND  Final   Special Requests   Final    BOTTLES DRAWN AEROBIC AND ANAEROBIC Blood Culture adequate volume    Culture   Final    NO GROWTH 2 DAYS Performed at [facility], [address] [place],  [address]    Report Status PENDING  Incomplete  Resp Panel by RT-PCR (Flu A&B, Covid) Nasopharyngeal Swab     Status: None   Collection Time: 01/23/20  8:27 PM  Specimen: Nasopharyngeal Swab; Nasopharyngeal(NP) swabs in vial transport medium  Result Value Ref Range Status   SARS Coronavirus 2 by RT PCR NEGATIVE NEGATIVE Final    Comment: (NOTE) SARS-CoV-2 target nucleic acids are NOT DETECTED.  The SARS-CoV-2 RNA is generally detectable in upper respiratory specimens during the acute phase of infection. The lowest concentration of SARS-CoV-2 viral copies this assay can detect is 138 copies/mL. A negative result does not preclude SARS-Cov-2 infection and should not be used as the sole basis for treatment or other patient management decisions. A negative result may occur with  improper specimen collection/handling, submission of specimen other than nasopharyngeal swab, presence of viral mutation(s) within the areas targeted by this assay, and inadequate number of viral copies(<138 copies/mL). A negative result must be combined with clinical observations, patient history, and epidemiological information. The expected result is Negative.  Fact Sheet for Patients:  EntrepreneurPulse.com.au  Fact Sheet for Healthcare Providers:  IncredibleEmployment.be  This test is no t yet approved or cleared by the [place] FDA and  has been authorized for detection and/or diagnosis of SARS-CoV-2 by FDA under an Emergency Use Authorization (EUA). This EUA will remain  in effect (meaning this test can be used) for the duration of the COVID-19 declaration under Section 564(b)(1) of the Act, 21 U.S.C.section 360bbb-3(b)(1), unless the authorization is terminated  or revoked sooner.       Influenza A by PCR NEGATIVE NEGATIVE Final   Influenza B by PCR  NEGATIVE NEGATIVE Final    Comment: (NOTE) The Xpert Xpress SARS-CoV-2/FLU/RSV plus assay is intended as an aid in the diagnosis of influenza from Nasopharyngeal swab specimens and should not be used as a sole basis for treatment. Nasal washings and aspirates are unacceptable for Xpert Xpress SARS-CoV-2/FLU/RSV testing.  Fact Sheet for Patients: EntrepreneurPulse.com.au  Fact Sheet for Healthcare Providers: IncredibleEmployment.be  This test is not yet approved or cleared by the [place] FDA and has been authorized for detection and/or diagnosis of SARS-CoV-2 by FDA under an Emergency Use Authorization (EUA). This EUA will remain in effect (meaning this test can be used) for the duration of the COVID-19 declaration under Section 564(b)(1) of the Act, 21 U.S.C. section 360bbb-3(b)(1), unless the authorization is terminated or revoked.  Performed at [facility], [address], [place], [place] [address]   Urine culture     Status: None   Collection Time: 01/23/20  9:11 PM   Specimen: In/Out Cath Urine  Result Value Ref Range Status   Specimen Description   Final    IN/OUT CATH URINE Performed at [facility], [address] [place], [place] [address]    Special Requests   Final    NONE Performed at [facility], [address], [place], [place] [address]    Culture   Final    NO GROWTH Performed at [facility], [place] [address] [place], [place] [address]    Report Status 01/25/2020 FINAL  Final  Culture, blood (single)     Status: None (Preliminary result)   Collection Time: 01/23/20 10:05 PM   Specimen: BLOOD  Result Value Ref Range Status   Specimen Description BLOOD BLOOD LEFT FOREARM  Final   Special Requests   Final    BOTTLES DRAWN AEROBIC ONLY Blood Culture results may not be optimal due to an inadequate volume of blood received in culture bottles   Culture   Final    NO GROWTH 2  DAYS Performed at [facility], [place], [place] [address]    Report Status PENDING  Incomplete  MRSA PCR Screening     Status: None   Collection Time: 01/24/20  1:00 AM   Specimen: Nasopharyngeal  Result Value Ref Range Status   MRSA by PCR NEGATIVE NEGATIVE Final    Comment:        The GeneXpert MRSA Assay (FDA approved for NASAL specimens only), is one component of a comprehensive MRSA colonization surveillance program. It is not intended to diagnose MRSA infection nor to guide or monitor treatment for MRSA infections. Performed at [facility], [address] [place], [place] [address]   Culture, respiratory (non-expectorated)     Status: None (Preliminary result)   Collection Time: 01/24/20 11:58 AM   Specimen: SPU; Respiratory  Result Value Ref Range Status   Specimen Description   Final    SPUTUM Performed at [facility], [address], [place], [place] [address]    Special Requests   Final    NONE Performed at [facility], [address] [place], [place] [address]    Gram Stain NO WBC SEEN NO ORGANISMS SEEN   Final   Culture   Final    CULTURE REINCUBATED FOR BETTER GROWTH Performed at [facility], [place] [address] [place], [place] [address]    Report Status PENDING  Incomplete          IMAGING    DG Chest Port 1 View  Result Date: 01/25/2020 CLINICAL DATA:  Altered mental status, shortness of breath EXAM: PORTABLE CHEST 1 VIEW COMPARISON:  01/23/2020 FINDINGS: Tracheostomy device is present. Enteric tube passes into the stomach with tip out of field of view. Patient is rotated. Stable elevation of the right hemidiaphragm. Likely chronic mild interstitial prominence. Persistent patchy density at the left lung base no significant pleural effusion. No pneumothorax. Stable cardiomediastinal contours. IMPRESSION: Persistent patchy atelectasis/consolidation at the left lung base. Electronically  Signed   By: Macy Mis M.D.   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    Noncompliant with Vent for last 2 years, signs and symptoms of aspiration Will likely need PEG tube  Severe ACUTE Hypoxic and Hypercapnic Respiratory Failure -continue Full MV support -continue Bronchodilator Therapy -Wean Fio2 and PEEP as tolerated -will perform SAT/SBT when respiratory parameters are met -VAP/VENT bundle implementation -Trache status - no signs of tracheitis -patient does have Left lung base infiltrate and tracheal aspirate is in process - due to higher risk of recurrent aspiration - have consulted with surgery regarding PEG placement - Surgery on case - Dr Dahlia Byes - possible Friday PEG    GI GI PROPHYLAXIS as indicated  NUTRITIONAL STATUS Nutrition Status: Nutrition Problem: Inadequate oral intake Etiology: inability to eat Signs/Symptoms: NPO status Interventions: Tube feeding   DIET-->NPO Constipation protocol as indicated NG in place  ENDO - will use ICU hypoglycemic\Hyperglycemia  protocol if indicated     ELECTROLYTES -follow labs as needed -replace as needed -pharmacy consultation and following   DVT/GI PRX ordered and assessed TRANSFUSIONS AS NEEDED MONITOR FSBS I Assessed the need for Labs I Assessed the need for Foley I Assessed the need for  Central Venous Line Family Discussion when available I Assessed the need for Mobilization I made an Assessment of medications to be adjusted accordingly Safety Risk assessment completed   CASE DISCUSSED IN MULTIDISCIPLINARY ROUNDS WITH ICU TEAM  Critical care provider statement:    Critical care time (minutes):  33   Critical care time was exclusive of:  Separately billable procedures and  treating other patients   Critical care was necessary to treat or prevent imminent or  life-threatening deterioration of the following conditions:  acute hypoxemic respiratory failure, left lung pneumonia, aspiration , tracheostomy status   Critical care was time spent personally by me on the following  activities:  Development of treatment plan with patient or surrogate,  discussions with consultants, evaluation of patient's response to  treatment, examination of patient, obtaining history from patient or  surrogate, ordering and performing treatments and interventions, ordering  and review of laboratory studies and re-evaluation of patient's condition   I assumed direction of critical care for this patient from another  provider in my specialty: no       Ottie Glazier, M.D.  Pulmonary & [place]

## 2020-01-26 ENCOUNTER — Inpatient Hospital Stay: Payer: Medicare Other

## 2020-01-26 DIAGNOSIS — Z7189 Other specified counseling: Secondary | ICD-10-CM

## 2020-01-26 DIAGNOSIS — E440 Moderate protein-calorie malnutrition: Secondary | ICD-10-CM

## 2020-01-26 DIAGNOSIS — Z515 Encounter for palliative care: Secondary | ICD-10-CM

## 2020-01-26 LAB — BLOOD GAS, ARTERIAL
Acid-Base Excess: 7.9 mmol/L — ABNORMAL HIGH (ref 0.0–2.0)
Bicarbonate: 32.7 mmol/L — ABNORMAL HIGH (ref 20.0–28.0)
FIO2: 0.28
O2 Saturation: 98 %
Patient temperature: 37
pCO2 arterial: 46 mmHg (ref 32.0–48.0)
pH, Arterial: 7.46 — ABNORMAL HIGH (ref 7.350–7.450)
pO2, Arterial: 98 mmHg (ref 83.0–108.0)

## 2020-01-26 LAB — CBC
HCT: 35.8 % — ABNORMAL LOW (ref 36.0–46.0)
Hemoglobin: 11 g/dL — ABNORMAL LOW (ref 12.0–15.0)
MCH: 26.1 pg (ref 26.0–34.0)
MCHC: 30.7 g/dL (ref 30.0–36.0)
MCV: 85 fL (ref 80.0–100.0)
Platelets: 147 10*3/uL — ABNORMAL LOW (ref 150–400)
RBC: 4.21 MIL/uL (ref 3.87–5.11)
RDW: 14.4 % (ref 11.5–15.5)
WBC: 9 10*3/uL (ref 4.0–10.5)
nRBC: 0 % (ref 0.0–0.2)

## 2020-01-26 LAB — GLUCOSE, CAPILLARY
Glucose-Capillary: 177 mg/dL — ABNORMAL HIGH (ref 70–99)
Glucose-Capillary: 155 mg/dL — ABNORMAL HIGH (ref 70–99)
Glucose-Capillary: 134 mg/dL — ABNORMAL HIGH (ref 70–99)
Glucose-Capillary: 129 mg/dL — ABNORMAL HIGH (ref 70–99)
Glucose-Capillary: 136 mg/dL — ABNORMAL HIGH (ref 70–99)
Glucose-Capillary: 113 mg/dL — ABNORMAL HIGH (ref 70–99)

## 2020-01-26 LAB — BASIC METABOLIC PANEL
Anion gap: 8 (ref 5–15)
BUN: 8 mg/dL (ref 8–23)
CO2: 28 mmol/L (ref 22–32)
Calcium: 9 mg/dL (ref 8.9–10.3)
Chloride: 107 mmol/L (ref 98–111)
Creatinine, Ser: 0.71 mg/dL (ref 0.44–1.00)
GFR, Estimated: >60 mL/min (ref >60)
Glucose, Bld: 184 mg/dL — ABNORMAL HIGH (ref 70–99)
Potassium: 4.9 mmol/L (ref 3.5–5.1)
Sodium: 143 mmol/L (ref 135–145)

## 2020-01-26 LAB — PHOSPHORUS
Phosphorus: 3 mg/dL (ref 2.5–4.6)
Phosphorus: 2.5 mg/dL (ref 2.5–4.6)

## 2020-01-26 LAB — CULTURE, RESPIRATORY W GRAM STAIN
Culture: NORMAL
Gram Stain: NONE SEEN

## 2020-01-26 LAB — MAGNESIUM
Magnesium: 2.1 mg/dL (ref 1.7–2.4)
Magnesium: 1.9 mg/dL (ref 1.7–2.4)

## 2020-01-26 LAB — VITAMIN B1: Vitamin B1 (Thiamine): 229.8 nmol/L — ABNORMAL HIGH (ref 66.5–200.0)

## 2020-01-26 MED ORDER — ACETAMINOPHEN 650 MG RE SUPP: 650.0000 mg | Freq: Four times a day (QID) | RECTAL | Status: DC | PRN

## 2020-01-26 NOTE — Progress Notes (Signed)
OT Cancellation Note  Patient Details Name: Angela David MRN: [id number] DOB: 13-Apr-1940   Cancelled Treatment:    Reason Eval/Treat Not Completed: Fatigue/lethargy limiting ability to participate;Patient declined, no reason specified  OT consult received and chart reviewed. Pt well known to rehab services. Pt politely declines citing fatigue from PT earlier this date. In addition, pt talking over a procedure with her spouse at this time. Will f/u for occupational therapy evaluation at later date/time as able. Thank you.  Gerrianne Scale, [place], OTR/L ascom [phone] 01/26/20, 4:39 PM

## 2020-01-26 NOTE — Progress Notes (Signed)
New NGT placed this am (757am note)was found removed in bed shortly after verification by xray. Pt emphasized that she did not remove it, that it came out on own. During rounds, discussed option of dopov placement with hopes that it would be more comfortable once placed. During first placement attempt, went into pt mouth and after that she did not want to try again until the afternoon.  In the afternoon, second attempt for NG placement with large bore as this was easier to place this am. Once again, was not successful on first try and after that pt refused further placement. Dr.Aleskerov notified and IR consult ordered for placement. All meds and TF held for now.  Pt has become increasingly confused and also did dislodge an IV. Per husband, this is often how she appears when CO2 levels start to rise. ABG ordered and drawn by RT, results pending.

## 2020-01-26 NOTE — Progress Notes (Signed)
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    History of present illness   79 yo female presented to the emergency department from home because of family concerns regarding worsening altered mental status.  Upon arrival to the ED patient was unable to give any history due to lethargy.  She had a recent admission at [facility] for altered mental status and suspected aspiration pneumonia.  The patient was discharged 2 days ago on 01/21/2020 and family reported via ED documentation that the patient has undergone a drastic mental status change.  ED work-up included a CT head which was negative for any acute abnormality. ABG revealed respiratory acidosis with hypoxia and hypercarbia and lab work showed leukocytosis.  Initial lactic acid was unimpressive at 1.0, repeat 1.9, cultures and procalcitonin are pending.  Chest x-ray shows no significant interval change, with hazy lung markings and unchanged potential opacity vs small left lower lobe pleural effusion. PCCM team consulted for further management and monitoring with admission to the ICU.  Past Medical History  Tracheal stenosis s/p tracheostomy Breast Cancer - 2002 T2DM HLD Glaucoma GERD HTN  SIGNIFICANT EVENTS 01/23/20 AMS in ED, hypercarbic- placed on ventilator > Admit to ICU 11/28 remains on vent, critically ill 11/30- Patient for IR guided [place] due to NPO at this time. Plan for PEG 01/30/20  CC  follow up respiratory failure  SUBJECTIVE Patient remains critically ill Prognosis is guarded On vent S/p trach  Vent Mode: PSV FiO2 (%):  [28 %] 28 % PEEP:  [5 cmH20] 5 cmH20 Pressure Support:  [10 cmH20] 10 cmH20  CBC    Component Value Date/Time   WBC 9.0 01/26/2020 0525   RBC 4.21 01/26/2020 0525   HGB 11.0 (L) 01/26/2020 0525   HGB 14.4 12/21/2015 1040   HCT 35.8 (L) 01/26/2020  0525   HCT 42.4 12/21/2015 1040   PLT 147 (L) 01/26/2020 0525   PLT 241 12/21/2015 1040   MCV 85.0 01/26/2020 0525   MCV 87 12/21/2015 1040   MCV 82 12/17/2011 0650   MCH 26.1 01/26/2020 0525   MCHC 30.7 01/26/2020 0525   RDW 14.4 01/26/2020 0525   RDW 12.5 12/21/2015 1040   RDW 13.3 12/17/2011 0650   LYMPHSABS 1.3 01/23/2020 2027   LYMPHSABS 1.1 12/21/2015 1040   LYMPHSABS 0.9 (L) 12/17/2011 0650   MONOABS 0.5 01/23/2020 2027   MONOABS 0.1 (L) 12/17/2011 0650   EOSABS 0.0 01/23/2020 2027   EOSABS 0.1 12/21/2015 1040   EOSABS 0.0 12/17/2011 0650   BASOSABS 0.0 01/23/2020 2027   BASOSABS 0.0 12/21/2015 1040   BASOSABS 0.0 12/17/2011 0650   CMP Latest Ref Rng & Units 01/26/2020 01/25/2020 01/24/2020  Glucose 70 - 99 mg/dL 184(H) 106(H) -  BUN 8 - 23 mg/dL 8 10 -  Creatinine 0.44 - 1.00 mg/dL 0.71 0.84 -  Sodium 135 - 145 mmol/L 143 140 -  Potassium 3.5 - 5.1 mmol/L 4.9 3.7 3.2(L)  Chloride 98 - 111 mmol/L 107 108 -  CO2 22 - 32 mmol/L 28 25 -  Calcium 8.9 - 10.3 mg/dL 9.0 8.0(L) -  Total Protein 6.5 - 8.1 g/dL - - -  Total Bilirubin 0.3 - 1.2 mg/dL - - -  Alkaline Phos 38 - 126 U/L - - -  AST 15 - 41 U/L - - -  ALT 0 - 44 U/L - - -  BP (!) 85/62   Pulse 97   Temp 99.1 F (37.3 C)   Resp (!) 25   Wt 74.6 kg   SpO2 98%   BMI 28.23 kg/m    I/O last 3 completed shifts: In: 1549.7 [I.V.:728.3; NG/GT:821.3] Out: 2610 [Urine:2610] Total I/O In: 711.7 [I.V.:711.7] Out: 725 [Urine:725]  SpO2: 98 % O2 Flow Rate (L/min): 5 L/min FiO2 (%): 28 %  Estimated body mass index is 28.23 kg/m as calculated from the following:   Height as of 01/15/20: '5\' 4"'  (1.626 m).   Weight as of this encounter: 74.6 kg.     REVIEW OF SYSTEMS  PATIENT IS UNABLE TO PROVIDE COMPLETE REVIEW OF SYSTEMS DUE TO SEVERE CRITICAL ILLNESS        PHYSICAL EXAMINATION:  GENERAL:critically ill appearing, +resp distress HEAD: Normocephalic, atraumatic.  EYES: Pupils equal, round,  reactive to light.  No scleral icterus.  MOUTH: Moist mucosal membrane. NECK: Supple. S/p trach PULMONARY: +rhonchi, CARDIOVASCULAR: S1 and S2. Regular rate and rhythm. No murmurs, rubs, or gallops.  GASTROINTESTINAL: Soft, nontender, -distended.  Positive bowel sounds.   MUSCULOSKELETAL: No swelling, clubbing, or edema.  NEUROLOGIC: no FND grossly SKIN:intact,warm,dry  MEDICATIONS: I have reviewed all medications and confirmed regimen as documented   CULTURE RESULTS   Recent Results (from the past 240 hour(s))  Culture, blood (single) w Reflex to ID Panel     Status: None   Collection Time: 01/17/20 12:48 PM   Specimen: BLOOD  Result Value Ref Range Status   Specimen Description BLOOD BLLA  Final   Special Requests   Final    BOTTLES DRAWN AEROBIC AND ANAEROBIC Blood Culture adequate volume   Culture   Final    NO GROWTH 5 DAYS Performed at [facility], [address], [place], [place] [address]    Report Status 01/22/2020 FINAL  Final  Expectorated sputum assessment w rflx to resp cult     Status: None   Collection Time: 01/17/20  3:26 PM   Specimen: Sputum  Result Value Ref Range Status   Specimen Description   Final    SPUTUM Performed at [facility], [address] [place], [place] [address]    Special Requests   Final    NONE Performed at [facility], [address], [place], [place] [address]    Sputum evaluation   Final    THIS SPECIMEN IS ACCEPTABLE FOR SPUTUM CULTURE Performed at [facility], [place] [address], [place], [place] [address]    Report Status 01/17/2020 FINAL  Final  Culture, respiratory     Status: None   Collection Time: 01/17/20  3:26 PM   Specimen: SPU  Result Value Ref Range Status   Specimen Description SPUTUM  Final   Special Requests NONE Reflexed from [id number]  Final   Gram Stain   Final    MODERATE WBC PRESENT, PREDOMINANTLY PMN FEW GRAM POSITIVE RODS FEW GRAM NEGATIVE RODS FEW GRAM POSITIVE  COCCI IN PAIRS IN CLUSTERS    Culture   Final    Normal respiratory flora-no Staph aureus or Pseudomonas seen Performed at [facility], [address], [place], [place] [address]    Report Status 01/19/2020 FINAL  Final  MRSA PCR Screening     Status: None   Collection Time: 01/17/20  5:00 PM   Specimen: Nasopharyngeal  Result Value Ref Range Status   MRSA by PCR NEGATIVE NEGATIVE Final    Comment:  The GeneXpert MRSA Assay (FDA approved for NASAL specimens only), is one component of a comprehensive MRSA colonization surveillance program. It is not intended to diagnose MRSA infection nor to guide or monitor treatment for MRSA infections. Performed at [facility], [address] [place], [place] [address]   Blood culture (routine single)     Status: None (Preliminary result)   Collection Time: 01/23/20  8:27 PM   Specimen: BLOOD  Result Value Ref Range Status   Specimen Description BLOOD BLOOD LEFT HAND  Final   Special Requests   Final    BOTTLES DRAWN AEROBIC AND ANAEROBIC Blood Culture adequate volume   Culture   Final    NO GROWTH 3 DAYS Performed at [facility], [address], [place], [place] [address]    Report Status PENDING  Incomplete  Resp Panel by RT-PCR (Flu A&B, Covid) Nasopharyngeal Swab     Status: None   Collection Time: 01/23/20  8:27 PM   Specimen: Nasopharyngeal Swab; Nasopharyngeal(NP) swabs in vial transport medium  Result Value Ref Range Status   SARS Coronavirus 2 by RT PCR NEGATIVE NEGATIVE Final    Comment: (NOTE) SARS-CoV-2 target nucleic acids are NOT DETECTED.  The SARS-CoV-2 RNA is generally detectable in upper respiratory specimens during the acute phase of infection. The lowest concentration of SARS-CoV-2 viral copies this assay can detect is 138 copies/mL. A negative result does not preclude SARS-Cov-2 infection and should not be used as the sole basis for treatment or other patient management  decisions. A negative result may occur with  improper specimen collection/handling, submission of specimen other than nasopharyngeal swab, presence of viral mutation(s) within the areas targeted by this assay, and inadequate number of viral copies(<138 copies/mL). A negative result must be combined with clinical observations, patient history, and epidemiological information. The expected result is Negative.  Fact Sheet for Patients:  EntrepreneurPulse.com.au  Fact Sheet for Healthcare Providers:  IncredibleEmployment.be  This test is no t yet approved or cleared by the [place] FDA and  has been authorized for detection and/or diagnosis of SARS-CoV-2 by FDA under an Emergency Use Authorization (EUA). This EUA will remain  in effect (meaning this test can be used) for the duration of the COVID-19 declaration under Section 564(b)(1) of the Act, 21 U.S.C.section 360bbb-3(b)(1), unless the authorization is terminated  or revoked sooner.       Influenza A by PCR NEGATIVE NEGATIVE Final   Influenza B by PCR NEGATIVE NEGATIVE Final    Comment: (NOTE) The Xpert Xpress SARS-CoV-2/FLU/RSV plus assay is intended as an aid in the diagnosis of influenza from Nasopharyngeal swab specimens and should not be used as a sole basis for treatment. Nasal washings and aspirates are unacceptable for Xpert Xpress SARS-CoV-2/FLU/RSV testing.  Fact Sheet for Patients: EntrepreneurPulse.com.au  Fact Sheet for Healthcare Providers: IncredibleEmployment.be  This test is not yet approved or cleared by the [place] FDA and has been authorized for detection and/or diagnosis of SARS-CoV-2 by FDA under an Emergency Use Authorization (EUA). This EUA will remain in effect (meaning this test can be used) for the duration of the COVID-19 declaration under Section 564(b)(1) of the Act, 21 U.S.C. section 360bbb-3(b)(1), unless the  authorization is terminated or revoked.  Performed at [facility], [address], [place], [place] [address]   Urine culture     Status: None   Collection Time: 01/23/20  9:11 PM   Specimen: In/Out Cath Urine  Result Value Ref Range Status  Specimen Description   Final    IN/OUT CATH URINE Performed at [facility], [address] [place], [place] [address]    Special Requests   Final    NONE Performed at [facility], [address] [place], [place] [address]    Culture   Final    NO GROWTH Performed at [facility], [place] [address], [place], [place] [address]    Report Status 01/25/2020 FINAL  Final  Culture, blood (single)     Status: None (Preliminary result)   Collection Time: 01/23/20 10:05 PM   Specimen: BLOOD  Result Value Ref Range Status   Specimen Description BLOOD BLOOD LEFT FOREARM  Final   Special Requests   Final    BOTTLES DRAWN AEROBIC ONLY Blood Culture results may not be optimal due to an inadequate volume of blood received in culture bottles   Culture   Final    NO GROWTH 3 DAYS Performed at [facility], [address], [place], [place] [address]    Report Status PENDING  Incomplete  MRSA PCR Screening     Status: None   Collection Time: 01/24/20  1:00 AM   Specimen: Nasopharyngeal  Result Value Ref Range Status   MRSA by PCR NEGATIVE NEGATIVE Final    Comment:        The GeneXpert MRSA Assay (FDA approved for NASAL specimens only), is one component of a comprehensive MRSA colonization surveillance program. It is not intended to diagnose MRSA infection nor to guide or monitor treatment for MRSA infections. Performed at [facility], [address] [place], [place] [address]   Culture, respiratory (non-expectorated)     Status: None (Preliminary result)   Collection Time: 01/24/20 11:58 AM   Specimen: SPU; Respiratory  Result Value Ref Range Status   Specimen Description   Final     SPUTUM Performed at [facility], [address], [place], [place] [address]    Special Requests   Final    NONE Performed at [facility], [address] [place], [place] [address]    Gram Stain NO WBC SEEN NO ORGANISMS SEEN   Final   Culture   Final    CULTURE REINCUBATED FOR BETTER GROWTH Performed at [facility], [place] [address], [place], [place] [address]    Report Status PENDING  Incomplete          IMAGING    DG Abd Portable 1V  Result Date: 01/26/2020 CLINICAL DATA:  NG tube placement EXAM: PORTABLE ABDOMEN - 1 VIEW COMPARISON:  01/24/2020 abdominal radiographs and prior. FINDINGS: Non weighted enteric tube tip and side hole project over the gastric body. Gas is seen within nondilated abdominal bowel loops. Right hemiabdomen surgical clips. Multilevel spondylosis. IMPRESSION: Non weighted enteric tube tip and side hole project over the gastric body. Electronically Signed   By: Primitivo Gauze M.D.   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    Noncompliant with Vent for last 2 years, signs and symptoms of aspiration Will likely need PEG tube  Severe ACUTE Hypoxic and Hypercapnic Respiratory Failure -continue Full MV support -continue Bronchodilator Therapy -Wean Fio2 and PEEP as tolerated -will perform SAT/SBT when  respiratory parameters are met -VAP/VENT bundle implementation -Trache status - no signs of tracheitis -patient does have Left lung base infiltrate and tracheal aspirate is in process - due to higher risk of recurrent aspiration - have consulted with surgery regarding PEG placement - Surgery on case - Dr Dahlia Byes - possible Friday PEG    Aspiration pneumonia   - recurrent aspiration    - s/p speech and swallow evaluation - patient aspirates    - plan for NGT and PEG   - c/w abx    - Surgery on case - appreciate input - for OR 12/4   GI GI PROPHYLAXIS as indicated  NUTRITIONAL STATUS Nutrition Status: Nutrition Problem: Inadequate oral intake Etiology: inability to eat Signs/Symptoms: NPO status Interventions: Tube feeding   DIET-->NPO Constipation protocol as indicated NG in place  ENDO - will use ICU hypoglycemic\Hyperglycemia protocol if indicated     ELECTROLYTES -follow labs as needed -replace as needed -pharmacy consultation and following   DVT/GI PRX ordered and assessed TRANSFUSIONS AS NEEDED MONITOR FSBS I Assessed the need for Labs I Assessed the need for Foley I Assessed the need for Central Venous Line Family Discussion when available I Assessed the need for Mobilization I made an Assessment of medications to be adjusted accordingly Safety Risk assessment completed   CASE DISCUSSED IN MULTIDISCIPLINARY ROUNDS WITH ICU TEAM  Critical care provider statement:    Critical care time (minutes):  33   Critical care time was exclusive of:  Separately billable procedures and  treating other patients   Critical care was necessary to treat or prevent imminent or  life-threatening deterioration of the following conditions:  acute hypoxemic respiratory failure, left lung pneumonia, aspiration , tracheostomy status   Critical care was time spent personally by me on the following  activities:  Development of treatment plan with patient or surrogate,   discussions with consultants, evaluation of patient's response to  treatment, examination of patient, obtaining history from patient or  surrogate, ordering and performing treatments and interventions, ordering  and review of laboratory studies and re-evaluation of patient's condition   I assumed direction of critical care for this patient from another  provider in my specialty: no       Ottie Glazier, M.D.  Pulmonary & [place]

## 2020-01-26 NOTE — Progress Notes (Signed)
[place] visited pt. while rounding in ICU; pt. sitting up in bed connected to vent via trach; pt. seemed to be in good spirits, but disappointed to be readmitted to the hospital Sunday after being discharged home Thursday.  Pt. says she initially did well post-discharge but then CO2 gas built up in her body and she had to be brought to the ED on Sunday; pt. shared w/RN in rm. that she hadn't been using her ventilator at night because it made too much noise.

## 2020-01-26 NOTE — Consult Note (Signed)
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    Clinical Assessment and Goals of Care: Patient was discharged 2 days prior to this admission. Please see previous palliative notes from last admission for further details.   Patient is resting in bed. She has trach collar in place which she routinely sleeps with at baseline. She arouses easily. She states she ate a small amount of food. She held out her hand and stated what she ate would fit in her hand. She states she ate what she wanted at that time, and states she had to stop because it would not go down.   We discussed her diagnosis, prognosis, GOC, and options.  A detailed discussion was had today regarding advanced directives.  Concepts specific to code status, artifical feeding and hydration, IV antibiotics and rehospitalization were discussed.  The difference between an aggressive medical intervention path and a comfort care path was discussed.  Values and goals of care important to patient and family were attempted to be elicited.  Discussed limitations of medical interventions to prolong quality of life in some situations and discussed the concept of human mortality. Discussed QOL in detail and her enjoyment of eating food. Discussed plans for feeding tube placement, and that following this she would then be able to determine if this would provide a QOL she is accepting of. She is aware she may shift to comfort at any time. She states she wants to be able to  go home and be with her husband [name].   She sets no limits on scope of treatment, but does state during this conversation that if her heart or breathing were to stop, and she were to die, that she would not want CPR to try to restart her heart and breathing. She states if she is dead, to let her go peacefully and naturally.  Patient requested call to husband to update. Attempted to call husband [name] at both numbers provided in demographics unsuccessfully.   I completed a MOST form today with the patient, and the signed original was placed in the chart. A photocopy was placed in the chart to be scanned into EMR. The patient outlined their wishes for the following treatment decisions:  Cardiopulmonary Resuscitation: Do Not Attempt Resuscitation (DNR/No CPR)  Medical Interventions: Full Scope of Treatment: Use intubation, advanced airway interventions, mechanical ventilation, cardioversion as indicated, medical treatment, IV fluids, etc, also provide comfort measures. Transfer to the hospital if indicated  Antibiotics: Antibiotics if indicated  IV Fluids: IV fluids if indicated  Feeding Tube: Feeding tube long-term if indicated         SUMMARY OF RECOMMENDATIONS   No CPR. She would  want to have ventilator support if needed for distress or failure. Full scope.    Prognosis:   Unable to determine      Primary Diagnoses: Present on Admission: . Acute respiratory failure ([place])   I have reviewed the medical record, interviewed the patient and family, and examined the patient. The following aspects are pertinent.  Past Medical History:  Diagnosis Date  . Breast cancer ([place]) 2002   right breast chemo and radiation  . Cancer [facility]) 2002   right breast  . Diabetes mellitus without complication ([place])   . Diabetic peripheral neuropathy ([place]) 08/28/2016  . GERD (gastroesophageal reflux disease)   . Glaucoma   . High cholesterol   . History of colon polyps   . Peripheral neuropathy 08/28/2016   . Personal history of chemotherapy 2002   BREAST CA  . Personal history of malignant neoplasm of breast   . Personal history of radiation therapy 2002   BREAST CA  . Vocal cord paralysis 2012   Social History   Socioeconomic History  . Marital status: Married    Spouse name: [name]  . Number of children: Not on file  . Years of education: Not on file  . Highest education level: Not on file  Occupational History  . Not on file  Tobacco Use  . Smoking status: Never Smoker  . Smokeless tobacco: Never Used  Vaping Use  . Vaping Use: Never used  Substance and Sexual Activity  . Alcohol use: No  . Drug use: No  . Sexual activity: Not on file  Other Topics Concern  . Not on file  Social History Narrative   Lives with husband   Social Determinants of Health   Financial Resource Strain:   . Difficulty of Paying Living Expenses: Not on file  Food Insecurity:   . Worried About Charity fundraiser in the Last Year: Not on file  . Ran Out of Food in the Last Year: Not on file  Transportation Needs:   . Lack of Transportation (Medical): Not on file  . Lack of Transportation (Non-Medical): Not on file  Physical Activity:   . Days of Exercise per Week: Not on file  . Minutes of Exercise per Session: Not on file  Stress:   . Feeling of Stress : Not on file  Social Connections:   . Frequency of Communication with Friends and Family: Not on file  . Frequency of Social Gatherings with Friends and Family: Not on file  . Attends Religious Services: Not on file  . Active Member of Clubs or Organizations: Not on file  . Attends Archivist Meetings: Not on file  . Marital Status: Not on file   Family History  Problem Relation Age of Onset  . Cancer Father        bone cancer  . Cancer Sister        lymphoma  . Cancer Brother   . Breast cancer Neg Hx    Scheduled Meds: . allopurinol  100 mg Per NG tube Daily  . busPIRone  30 mg Per NG tube BID  . chlorhexidine  gluconate (MEDLINE KIT)  15 mL Mouth Rinse BID  . Chlorhexidine Gluconate Cloth  6 each Topical Daily  . enoxaparin (LOVENOX) injection  40 mg Subcutaneous Q24H  . escitalopram  30 mg Per Tube Daily  . feeding supplement (PROSource TF)  90 mL Per Tube BID  . feeding supplement (VITAL 1.5 CAL)  1,000 mL Per Tube Q24H  .  folic acid  1 mg Per Tube Daily  . gabapentin  600 mg Per Tube Q8H  . mouth rinse  15 mL Mouth Rinse 10 times per day  . melatonin  5 mg Per Tube QHS  . multivitamin with minerals  1 tablet Per Tube Daily  . pantoprazole (PROTONIX) IV  40 mg Intravenous QHS  . pravastatin  20 mg Per NG tube q1800  . rOPINIRole  0.5 mg Per Tube QHS  . scopolamine  1 patch Transdermal Q72H   Continuous Infusions: . sodium chloride Stopped (01/24/20 0935)   PRN Meds:.acetaminophen, albuterol, ondansetron (ZOFRAN) IV Medications Prior to Admission:  Prior to Admission medications   Medication Sig Start Date End Date Taking? Authorizing Provider  acetaminophen (TYLENOL) 325 MG tablet Take 2 tablets (650 mg total) by mouth every 6 (six) hours as needed for moderate pain or fever. 04/01/19  Yes Tyler Pita, MD  allopurinol (ZYLOPRIM) 100 MG tablet Take 100 mg by mouth daily.   Yes [provider]  amLODipine (NORVASC) 10 MG tablet Take 1 tablet (10 mg total) by mouth daily. 01/22/20  Yes Fritzi Mandes, MD  amoxicillin-clavulanate (AUGMENTIN) 875-125 MG tablet Take 1 tablet by mouth every 12 (twelve) hours for 5 days. 01/21/20 01/26/20 Yes Fritzi Mandes, MD  busPIRone (BUSPAR) 15 MG tablet Take 30 mg by mouth 2 (two) times daily.   Yes [provider]  Calcium Carbonate-Vitamin D 600-400 MG-UNIT tablet Take 1 tablet by mouth daily.    Yes [provider]  escitalopram (LEXAPRO) 10 MG tablet Take 10 mg by mouth daily. (take with 79m tablet to equal 379mtotal daily)   Yes [provider]  escitalopram (LEXAPRO) 20 MG tablet Take 20 mg by mouth daily. (take  with 1014mablet to equal 3m51mtal daily)   Yes [provider]  fluconazole (DIFLUCAN) 150 MG tablet Take 150 mg by mouth 2 (two) times a week.   Yes [provider]  folic acid (FOLVITE) 1 MG tablet Take 1 mg by mouth daily.   Yes [provider]  gabapentin (NEURONTIN) 600 MG tablet Take 600 mg by mouth 3 (three) times daily. 05/26/19  Yes [provider]  lovastatin (MEVACOR) 20 MG tablet Take 20 mg by mouth at bedtime.   Yes [provider]  Melatonin 5 MG TABS Take 1 tablet (5 mg total) by mouth at bedtime. 04/01/19  Yes GonzTyler Pita  meloxicam (MOBIC) 15 MG tablet Take 15 mg by mouth daily. 01/01/20  Yes [provider]  metoprolol tartrate (LOPRESSOR) 25 MG tablet Take 1 tablet (25 mg total) by mouth 2 (two) times daily. Patient taking differently: Take 25 mg by mouth daily.  04/01/19  Yes GonzTyler Pita  pantoprazole (PROTONIX) 40 MG tablet Take 40 mg by mouth daily.    Yes [provider]  rOPINIRole (REQUIP) 0.5 MG tablet Take 0.5 mg by mouth at bedtime.  06/09/18  Yes [provider]  scopolamine (TRANSDERM-SCOP) 1 MG/3DAYS Place 1 patch (1.5 mg total) onto the skin every 3 (three) days. 01/24/20  Yes PateFritzi Mandes  sodium chloride 0.9 % nebulizer solution Take 3 mLs by nebulization as needed for wheezing.  07/17/19  Yes [provider]  feeding supplement (ENSURE ENLIVE / ENSURE PLUS) LIQD Take 237 mLs by mouth 3 (three) times daily between meals. 01/21/20   PateFritzi Mandes  Nebulizers (COMPRESSOR/NEBULIZER) MISC Nebulizer with supplies, trach collar for med admin. 04/01/19  Tyler Pita, MD   Allergies  Allergen Reactions  . Shellfish Allergy Shortness Of Breath    respitatory distress   . Sodium Sulfite Shortness Of Breath    Severe Congestion that creates inability to breath  . Sulfa Antibiotics Other (See Comments) and Shortness Of Breath    Other reaction(s): OTHER Other  reaction(s): Other (See Comments) respitatory distress  Other reaction(s): OTHER respitatory distress  Other reaction(s): OTHER Other reaction(s): Other (See Comments) respitatory distress  respitatory distress   . Codeine Other (See Comments)    Per patient "it kept her up all night"   Review of Systems  All other systems reviewed and are negative.   Physical Exam Pulmonary:     Effort: Pulmonary effort is normal.     Comments: TC in place.  Neurological:     Mental Status: She is alert.     Vital Signs: BP (!) 166/89   Pulse 100   Temp 99.3 F (37.4 C)   Resp (!) 29   Wt 74.6 kg   SpO2 98%   BMI 28.23 kg/m  Pain Scale: 0-10   Pain Score: 0-No pain   SpO2: SpO2: 98 % O2 Device:SpO2: 98 % O2 Flow Rate: .O2 Flow Rate (L/min): 5 L/min  IO: Intake/output summary:   Intake/Output Summary (Last 24 hours) at 01/26/2020 1038 Last data filed at 01/26/2020 1610 Gross per 24 hour  Intake 2083 ml  Output 3105 ml  Net -1022 ml    LBM: Last BM Date: 01/25/20 Baseline Weight: Weight: 74.6 kg Most recent weight: Weight: 74.6 kg     Palliative Assessment/Data:     Time In: 9:55 Time Out: 10:45 Time Total: 50 min Greater than 50%  of this time was spent counseling and coordinating care related to the above assessment and plan.  Signed by: Asencion Gowda, NP   Please contact Palliative Medicine Team phone at [phone] for questions and concerns.  For individual provider: See Shea Evans

## 2020-01-26 NOTE — Consult Note (Addendum)
[facility] for Electrolyte Monitoring and Replacement   Recent Labs: Potassium (mmol/L)  Date Value  01/26/2020 4.9  12/17/2011 4.1   Magnesium (mg/dL)  Date Value  01/26/2020 2.1  12/17/2011 2.2   Calcium (mg/dL)  Date Value  01/26/2020 9.0   Calcium, Total (mg/dL)  Date Value  12/17/2011 8.8   Albumin (g/dL)  Date Value  01/23/2020 4.0  08/28/2016 4.3  12/16/2011 4.1   Phosphorus (mg/dL)  Date Value  01/26/2020 3.0  12/16/2011 2.2 (L)   Sodium (mmol/L)  Date Value  01/26/2020 143  08/28/2016 143  12/17/2011 139    Assessment: Patient is a 79 y/o F with medical history including history of breast cancer, diabetes, GERD, tracheal stenosis s/p tracheostomy who presented to the ED 11/27 with unresponsiveness. Patient was recently admitted 10/27 thru 11/19 with altered mental status and pneumonia. Pharmacy has been consulted to assist with monitoring and repletion of electrolytes as indicated.    Goal of Therapy:  Potassium 4.0 - 5.1 mmol/L Magnesium 2.0 - 2.4 mg/dL All other electrolytes within normal limits  Plan:   No electrolyte replacement warranted  Follow-up electrolytes with PM labs as currently ordered  Benita Gutter 01/26/2020 8:45 AM

## 2020-01-26 NOTE — Consult Note (Signed)
Patient ID: Angela David, female   DOB: 1940-03-10, 79 y.o.   MRN: [id number]  HPI Angela David is a 79 y.o. female seen in consultation at the request of Dr.Aleskerov for gastrostomy tube.  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  More recently few days ago was hospitalized due to altered mental status.  This is suspected to be related to aspiration pneumonia.  The patient was stabilized and placed on the ventilator as well as broad-spectrum antibiotics started.  She did have a CT of the chest that I have personally reviewed showing no evidence of PE.  There is evidence of dilated esophagus.  Stomach seems to be accessible.  He is currently on the vent but she is awake and alert and able to mouth words.  Also the husband was present. BMP was completely normal.  CBC shows platelets of 147.00 and a hemoglobin of 9.6.,  INR was normal, LFTs were normal.  HPI  Past Medical History:  Diagnosis Date  . Breast cancer ([place]) 2002   right breast chemo and radiation  . Cancer [facility]) 2002   right breast  . Diabetes mellitus without complication ([place])   . Diabetic peripheral neuropathy ([place]) 08/28/2016  . GERD (gastroesophageal reflux disease)   . Glaucoma   . High cholesterol   . History of colon polyps   . Peripheral neuropathy 08/28/2016  . Personal history of chemotherapy 2002   BREAST CA  . Personal history of malignant neoplasm of breast   . Personal history of radiation therapy 2002   BREAST CA  . Vocal cord paralysis 2012    Past Surgical History:  Procedure Laterality Date  . ABDOMINAL HYSTERECTOMY    . BREAST BIOPSY Right 2008   neg  . BREAST EXCISIONAL BIOPSY Right 2002   positive  . BREAST LUMPECTOMY Right 2002   chemo and radiation  . BREAST SURGERY Right 2002   LUMPECTOMY, radiation, chemotherapy   . CHOLECYSTECTOMY N/A 12/10/2017   Procedure: LAPAROSCOPIC CHOLECYSTECTOMY;  Surgeon: Benjamine Sprague, DO;  Location: [facility] ORS;  Service: General;  Laterality: N/A;  . COLON RESECTION Right 11/06/2018   Procedure: HAND ASSISTED LAPAROSCOPIC COLON RESECTION, RIGHT;  Surgeon: Jules Husbands, MD;  Location: [facility] ORS;  Service: General;  Laterality: Right;  . COLONOSCOPY  2009,12/30/2013  . COLONOSCOPY WITH PROPOFOL N/A 04/16/2018   Tubulovillous polyp proximal transverse colon, large sessile polyp, proximal transverse colon;  Surgeon: Robert Bellow, MD;  Location: [facility] ENDOSCOPY;  Service: Endoscopy;  Laterality: N/A;  . ESOPHAGOGASTRODUODENOSCOPY (EGD) WITH PROPOFOL N/A 10/14/2018   Procedure: ESOPHAGOGASTRODUODENOSCOPY (EGD) WITH PROPOFOL;  Surgeon: Lucilla Lame, MD;  Location: [facility] ENDOSCOPY;  Service: Endoscopy;  Laterality: N/A;  . HYDROCELE EXCISION / REPAIR    . TRACHEAL SURGERY  2013  . TRACHEOSTOMY N/A    Dr Kathyrn Sheriff    Family History  Problem Relation Age of Onset  . Cancer Father        bone cancer  . Cancer Sister        lymphoma  . Cancer Brother   . Breast cancer Neg Hx     Social History Social History   Tobacco Use  . Smoking status: Never Smoker  . Smokeless tobacco: Never Used  Vaping Use  . Vaping Use: Never used  Substance Use Topics  . Alcohol  use: No  . Drug use: No    Allergies  Allergen Reactions  . Shellfish Allergy Shortness Of Breath    respitatory distress   . Sodium Sulfite Shortness Of Breath    Severe Congestion that creates inability to breath  . Sulfa Antibiotics Other (See Comments) and Shortness Of Breath    Other reaction(s): OTHER Other reaction(s): Other (See Comments) respitatory distress  Other reaction(s): OTHER respitatory distress  Other reaction(s): OTHER Other reaction(s): Other (See Comments) respitatory distress  respitatory distress   . Codeine Other (See Comments)    Per patient "it kept her up all night"     Current Facility-Administered Medications  Medication Dose Route Frequency Provider Last Rate Last Admin  . 0.9 %  sodium chloride infusion  250 mL Intravenous Continuous Rust-Chester, Huel Cote, NP   Stopped at 01/24/20 0935  . acetaminophen (TYLENOL) tablet 650 mg  650 mg Per NG tube Q6H PRN Flora Lipps, MD   650 mg at 01/25/20 1141  . albuterol (PROVENTIL) (2.5 MG/3ML) 0.083% nebulizer solution 2.5 mg  2.5 mg Nebulization Q4H PRN Rust-Chester, Britton L, NP      . allopurinol (ZYLOPRIM) tablet 100 mg  100 mg Per NG tube Daily Dallie Piles, RPH   100 mg at 01/25/20 1430  . busPIRone (BUSPAR) tablet 30 mg  30 mg Per NG tube BID Dallie Piles, RPH   30 mg at 01/25/20 2127  . chlorhexidine gluconate (MEDLINE KIT) (PERIDEX) 0.12 % solution 15 mL  15 mL Mouth Rinse BID Rust-Chester, Britton L, NP   15 mL at 01/25/20 2045  . Chlorhexidine Gluconate Cloth 2 % PADS 6 each  6 each Topical Daily Rust-Chester, Huel Cote, NP   6 each at 01/25/20 1142  . enoxaparin (LOVENOX) injection 40 mg  40 mg Subcutaneous Q24H Rust-Chester, Britton L, NP   40 mg at 01/25/20 2126  . escitalopram (LEXAPRO) tablet 30 mg  30 mg Per Tube Daily Dallie Piles, RPH   30 mg at 01/25/20 1431  . feeding supplement (PROSource TF) liquid 90 mL  90 mL Per Tube BID Ottie Glazier, MD   90 mL at 01/25/20 2200  . feeding supplement (VITAL 1.5 CAL) liquid 1,000 mL  1,000 mL Per Tube Q24H Aleskerov, Fuad, MD 40 mL/hr at 01/25/20 1431 1,000 mL at 01/25/20 1431  . folic acid (FOLVITE) tablet 1 mg  1 mg Per Tube Daily Dallie Piles, RPH   1 mg at 01/25/20 1430  . gabapentin (NEURONTIN) 250 MG/5ML solution 600 mg  600 mg Per Tube Q8H Dallie Piles, RPH   600 mg at 01/25/20 2127  . lactated ringers 1,000 mL with potassium chloride 40 mEq infusion   Intravenous Continuous Benita Gutter, RPH 50 mL/hr at 01/25/20 2346 New Bag at 01/25/20 2346  . MEDLINE mouth rinse  15 mL Mouth Rinse 10 times per day Rust-Chester, Britton L, NP    15 mL at 01/26/20 0352  . melatonin tablet 5 mg  5 mg Per Tube QHS Rust-Chester, Britton L, NP   5 mg at 01/25/20 2126  . multivitamin with minerals tablet 1 tablet  1 tablet Per Tube Daily Ottie Glazier, MD   1 tablet at 01/25/20 1430  . ondansetron (ZOFRAN) injection 4 mg  4 mg Intravenous Q6H PRN Rust-Chester, Britton L, NP      . pantoprazole (PROTONIX) injection 40 mg  40 mg Intravenous QHS Rust-Chester, Britton L, NP   40 mg at 01/25/20 2126  .  pravastatin (PRAVACHOL) tablet 20 mg  20 mg Per NG tube q1800 Dallie Piles, RPH   20 mg at 01/25/20 1753  . rOPINIRole (REQUIP) tablet 0.5 mg  0.5 mg Per Tube QHS Benita Gutter, RPH   0.5 mg at 01/25/20 2127  . scopolamine (TRANSDERM-SCOP) 1 MG/3DAYS 1.5 mg  1 patch Transdermal Q72H Ottie Glazier, MD   1.5 mg at 01/25/20 1626     Review of Systems Full ROS  Not able to obtained due to pt's inability to talk.  Physical Exam Blood pressure (!) 156/145, pulse 90, temperature 99.1 F (37.3 C), resp. rate (!) 21, weight 74.6 kg, SpO2 99 %. CONSTITUTIONAL: NAD, on the vent but able to mouth words EYES: Pupils are equal, round, , Sclera are non-icteric. EARS, NOSE, MOUTH AND THROAT: Hearing is intact to voice.  Neck: she has a trach and she is on the vent but able to breath spontaneosuly LYMPH NODES:  Lymph nodes in the neck are normal. RESPIRATORY:  Lungs are decrease bilaterally. There is normal respiratory effort, with equal breath sounds bilaterally, and without pathologic use of accessory muscles. CARDIOVASCULAR: Heart is regular without murmurs, gallops, or rubs. GI: The abdomen is soft, nontender, and nondistended. Reducible ventral hernia about 4 cms size.  There are no palpable masses. There is no hepatosplenomegaly. There are normal bowel sounds in all quadrants. GU: Rectal deferred.   MUSCULOSKELETAL: Normal muscle strength and tone. No cyanosis or edema.   SKIN: Turgor is good and there are no pathologic skin lesions or  ulcers. NEUROLOGIC: Motor and sensation is grossly normal. Cranial nerves are grossly intact. PSYCH:  Oriented to person, place and time. Affect is normal.  Data Reviewed  I have personally reviewed the patient's imaging, laboratory findings and medical records.    Assessment/Plan 79 year old female with history of tracheal stenosis recently found unresponsive likely from recurrent pneumonias.  In need for emergent enteral access.  Discussed with the patient in detail about options I do think that a robotic gastrostomy tube is a reasonable option.  The patient understands the situation and she wishes to proceed.  Procedure discussed with patient and the husband in detail.  Risks, benefits and possible applications including but not limited to: Bleeding, infection bowel injuries, tube malfunction, chronic pain.  She also has a ventral hernia that given the acuteness of her illness probably is not in her best interest to have it repaired during this operative setting.  We will schedule her gastrostomy tube later this week   Caroleen Hamman, MD FACS General Surgeon 01/26/2020, 7:23 AM

## 2020-01-26 NOTE — Progress Notes (Signed)
NG dislodged by pt by accident. Tube feeding stopped. New tube placed. Abd xray ordered to confirm placement.

## 2020-01-26 NOTE — Evaluation (Signed)
Physical Therapy Evaluation Patient Details Name: Angela David MRN: [id number] DOB: 08/04/1940 Today's Date: 01/26/2020   History of Present Illness  Pt is a 79 y/o F her 11/19 with c/o hallucinations x 1 week. Imaging negative. Pt admitted for tx of acute encephalopathy of unclear etiology but suspect due to RLL PNA. (Pt also recently seen in ED on 01/11/20 s/p fall & found to have R clavicular fx & sent home with R shoulder immobilizer, instructed to f/u with ortho on OP basis.) PMH: R breast CA (2002), DM, peripheral neuropathy, glaucoma, high cholesterol, vocal cord paralysis  Clinical Impression  Pt showed good effort, though he was clearly fearful and hesitant with most activity.  She was able to manage getting to EOB with some assist, c/o some dizziness (reports vertigo, though still seemed more likely BP related) but was able to participate with EOB and standing activity.  Pt managed to tolerate 30-45 seconds standing EOB, but did not feel confident with attempts to weight shift/side step/etc.  Pt showed good effort but continues to be very weak and functionally limited, STR is the only safe clinical recommendation at this point.  Pt in agreement.     Follow Up Recommendations SNF;Supervision/Assistance - 24 hour    Equipment Recommendations  None recommended by PT    Recommendations for Other Services       Precautions / Restrictions Precautions Precautions: Fall Restrictions Weight Bearing Restrictions: No      Mobility  Bed Mobility Overal bed mobility: Needs Assistance Bed Mobility: Supine to Sit;Sit to Supine     Supine to sit: Mod assist Sit to supine: Mod assist   General bed mobility comments: good effort with attempt to get sitting EOB, ultimately needed some assist to get to/maintain sitting.  Lean to L in sitting despite assist to square up/flat bed/etc    Transfers Overall transfer level: Needs assistance Equipment used: Standard walker Transfers:  Sit to/from Stand Sit to Stand: Max assist;Mod assist         General transfer comment: Pt needed assist to get to standing, but once up was able to maintain static standing ~45 seconds with out heavy assist  Ambulation/Gait             General Gait Details: deferred secondary to anxiety and unsteadiness with static standing  Stairs            Wheelchair Mobility    Modified Rankin (Stroke Patients Only)       Balance Overall balance assessment: Needs assistance Sitting-balance support: Feet supported;Bilateral upper extremity supported Sitting balance-Leahy Scale: Fair Sitting balance - Comments: leaning L, c/o dizziness initially but able to maintain w/o direct assist for a few minutes     Standing balance-Leahy Scale: Poor Standing balance comment: fearful and reliant on the walker, but did manage to maintain standing 30-45 seconds w/ only infrequent light hands on assist                             Pertinent Vitals/Pain Pain Assessment: No/denies pain    Home Living Family/patient expects to be discharged to:: Skilled nursing facility Living Arrangements: Spouse/significant other Available Help at Discharge: Family;Available 24 hours/day Type of Home: House Home Access: Ramped entrance     Home Layout: One level (pt reports she has a split level, steps once inside) Home Equipment: Walker - 2 wheels;Wheelchair - manual;Cane - single point;Grab bars - toilet;Other (comment)  Prior Function Level of Independence: Independent with assistive device(s)         Comments: Pt reports that she rarely uses SPC, until 3 weeks ago was up and around in [facility] homw     Hand Dominance        Extremity/Trunk Assessment   Upper Extremity Assessment Upper Extremity Assessment:  (recent R shoulder injury testing deferred, generally weak)    Lower Extremity Assessment Lower Extremity Assessment: Generalized weakness;Overall WFL for tasks  assessed       Communication   Communication: Tracheostomy (no speaking valve)  Cognition Arousal/Alertness: Awake/alert Behavior During Therapy: Anxious Overall Cognitive Status: No family/caregiver present to determine baseline cognitive functioning                                        General Comments      Exercises Other Exercises Other Exercises: supine bed exercises 5-10 reps with hip Ab/Ad, heel slides with resisted leg extensions, AP and SAQ   Assessment/Plan    PT Assessment Patient needs continued PT services  PT Problem List Decreased strength;Decreased balance;Decreased cognition;Decreased knowledge of precautions;Cardiopulmonary status limiting activity;Decreased knowledge of use of DME;Decreased mobility;Decreased range of motion;Decreased safety awareness;Decreased coordination;Decreased activity tolerance       PT Treatment Interventions DME instruction;Functional mobility training;Balance training;Patient/family education;Modalities;Gait training;Therapeutic activities;Neuromuscular re-education;Wheelchair mobility training;Cognitive remediation;Manual techniques;Therapeutic exercise;Stair training    PT Goals (Current goals can be found in the Care Plan section)  Acute Rehab PT Goals Patient Stated Goal: none stated PT Goal Formulation: Patient unable to participate in goal setting Time For Goal Achievement: 02/09/20 Potential to Achieve Goals: Fair    Frequency Min 2X/week   Barriers to discharge        Co-evaluation               AM-PAC PT "6 Clicks" Mobility  Outcome Measure Help needed turning from your back to your side while in a flat bed without using bedrails?: A Lot Help needed moving from lying on your back to sitting on the side of a flat bed without using bedrails?: A Lot Help needed moving to and from a bed to a chair (including a wheelchair)?: Total Help needed standing up from a chair using your arms (e.g.,  wheelchair or bedside chair)?: A Lot Help needed to walk in hospital room?: Total Help needed climbing 3-5 steps with a railing? : Total 6 Click Score: 9    End of Session Equipment Utilized During Treatment: Gait belt (O2 via trach) Activity Tolerance: Patient tolerated treatment well;Patient limited by fatigue Patient left: in bed;with bed alarm set;with call bell/phone within reach Nurse Communication: Mobility status PT Visit Diagnosis: Muscle weakness (generalized) (M62.81);History of falling (Z91.81);Unsteadiness on feet (R26.81);Difficulty in walking, not elsewhere classified (R26.2)    Time: 4239-5320 PT Time Calculation (min) (ACUTE ONLY): 30 min   Charges:   PT Evaluation $PT Eval Low Complexity: 1 Low PT Treatments $Therapeutic Activity: 8-22 mins        Kreg Shropshire, DPT 01/26/2020, 3:35 PM

## 2020-01-26 NOTE — Progress Notes (Signed)
Inpatient Diabetes Program Recommendations  AACE/ADA: New Consensus Statement on Inpatient Glycemic Control   Target Ranges:  Prepandial:   less than 140 mg/dL      Peak postprandial:   less than 180 mg/dL (1-2 hours)      Critically ill patients:  140 - 180 mg/dL   Results for Angela David, Angela David (MRN [id number]) as of 01/26/2020 10:59  Ref. Range 01/25/2020 07:44 01/25/2020 11:24 01/25/2020 15:38 01/25/2020 20:45 01/25/2020 23:13 01/26/2020 03:51 01/26/2020 07:59  Glucose-Capillary Latest Ref Range: 70 - 99 mg/dL 107 (H) 129 (H) 104 (H) 141 (H) 148 (H) 177 (H) 155 (H)   Review of Glycemic Control  Current orders for Inpatient glycemic control: None; Vital @ 40 ml/hr  Inpatient Diabetes Program Recommendations:    Insulin: May want to consider ordering Novolog 0-6 units Q4H.  Thanks, Barnie Alderman, RN, MSN, CDE Diabetes Coordinator Inpatient Diabetes Program [phone] (Team Pager from 8am to 5pm)

## 2020-01-27 ENCOUNTER — Inpatient Hospital Stay: Admit: 2020-01-27 | Payer: Medicare Other

## 2020-01-27 LAB — BASIC METABOLIC PANEL
Anion gap: 10 (ref 5–15)
BUN: 7 mg/dL — ABNORMAL LOW (ref 8–23)
CO2: 29 mmol/L (ref 22–32)
Calcium: 9.3 mg/dL (ref 8.9–10.3)
Chloride: 100 mmol/L (ref 98–111)
Creatinine, Ser: 0.81 mg/dL (ref 0.44–1.00)
GFR, Estimated: >60 mL/min (ref >60)
Glucose, Bld: 151 mg/dL — ABNORMAL HIGH (ref 70–99)
Potassium: 4.2 mmol/L (ref 3.5–5.1)
Sodium: 139 mmol/L (ref 135–145)

## 2020-01-27 LAB — CBC WITH DIFFERENTIAL/PLATELET
Abs Immature Granulocytes: 0.05 10*3/uL (ref 0.00–0.07)
Basophils Absolute: 0.1 10*3/uL (ref 0.0–0.1)
Basophils Relative: 1 %
Eosinophils Absolute: 0.1 10*3/uL (ref 0.0–0.5)
Eosinophils Relative: 2 %
HCT: 37.7 % (ref 36.0–46.0)
Hemoglobin: 11.7 g/dL — ABNORMAL LOW (ref 12.0–15.0)
Immature Granulocytes: 1 %
Lymphocytes Relative: 15 %
Lymphs Abs: 1.3 10*3/uL (ref 0.7–4.0)
MCH: 26 pg (ref 26.0–34.0)
MCHC: 31 g/dL (ref 30.0–36.0)
MCV: 83.8 fL (ref 80.0–100.0)
Monocytes Absolute: 0.7 10*3/uL (ref 0.1–1.0)
Monocytes Relative: 7 %
Neutro Abs: 6.9 10*3/uL (ref 1.7–7.7)
Neutrophils Relative %: 74 %
Platelets: 170 10*3/uL (ref 150–400)
RBC: 4.5 MIL/uL (ref 3.87–5.11)
RDW: 13.8 % (ref 11.5–15.5)
WBC: 9.1 10*3/uL (ref 4.0–10.5)
nRBC: 0 % (ref 0.0–0.2)

## 2020-01-27 LAB — GLUCOSE, CAPILLARY
Glucose-Capillary: 132 mg/dL — ABNORMAL HIGH (ref 70–99)
Glucose-Capillary: 128 mg/dL — ABNORMAL HIGH (ref 70–99)
Glucose-Capillary: 126 mg/dL — ABNORMAL HIGH (ref 70–99)
Glucose-Capillary: 117 mg/dL — ABNORMAL HIGH (ref 70–99)
Glucose-Capillary: 130 mg/dL — ABNORMAL HIGH (ref 70–99)
Glucose-Capillary: 99 mg/dL (ref 70–99)

## 2020-01-27 LAB — MAGNESIUM: Magnesium: 1.9 mg/dL (ref 1.7–2.4)

## 2020-01-27 LAB — LEGIONELLA PNEUMOPHILA SEROGP 1 UR AG: L. pneumophila Serogp 1 Ur Ag: NEGATIVE

## 2020-01-27 LAB — PHOSPHORUS: Phosphorus: 2.8 mg/dL (ref 2.5–4.6)

## 2020-01-27 MED ORDER — DIPHENHYDRAMINE HCL 50 MG/ML IJ SOLN
INTRAMUSCULAR | Status: AC
  Administered 2020-01-27: 12.5 mg via INTRAVENOUS
  Filled 2020-01-27: qty 1

## 2020-01-27 MED ORDER — DIPHENHYDRAMINE HCL 50 MG/ML IJ SOLN: 12.5000 mg | Freq: Once | INTRAMUSCULAR | Status: AC

## 2020-01-27 NOTE — Progress Notes (Signed)
Pt seen and examined. Resting comfortable on the Vent. Improving slowly. I talked to the husband over the phone and once again explained the procedure in detail. Risks, benefits and possible complications including but not limited to bleeding, infection, bowel injuries, dysfunction of tube. Hernias. HE understands. We will plan for robotic G tube Friday. D/W Dr. Lanney Gins in detail as well. I spent at least 25 minutes in this encounter w > 50% spent in coordination and counseling

## 2020-01-27 NOTE — Evaluation (Signed)
Occupational Therapy Evaluation Patient Details Name: Angela David MRN: [id number] DOB: [birth year] Today's Date: 01/27/2020    History of Present Illness Pt is a 79 y/o F her 11/19 with c/o hallucinations x 1 week. Imaging negative. Pt admitted for tx of acute encephalopathy of unclear etiology but suspect due to RLL PNA. (Pt also recently seen in ED on 01/11/20 s/p fall & found to have R clavicular fx & sent home with R shoulder immobilizer, instructed to f/u with ortho on OP basis.) PMH: R breast CA (2002), DM, peripheral neuropathy, glaucoma, high cholesterol, vocal cord paralysis   Clinical Impression   Pt seen for OT evaluation this date in setting of acute hospitalization d/t AMS. Pt lives with spouse and reports mostly INDEP with self care and fxl mobility at baseline (occasional use of AD for fxl mobility), but endorses having remained quite weak after recent d/c from [facility]. Pt presents this date with improved mentation and she is willing to participate. OT facilitates pt participation in sup to sit transition with MOD A with HOB elevated. Pt tolerates ~5-6 mins static sitting while OT engages pt in UB self care with SETUP to MIN A. Pt requires MOD/MAX A for LB dressing at bed level citing weakness/fatigue.  Pt demos becomes somewhat drowsy towards end of session and falls asleep quickly once returned to supine. Anticipate pt will require f/u OT services in STR setting given current status versus PLOF.     Follow Up Recommendations  SNF    Equipment Recommendations  Other (comment) (defer to next level of care)    Recommendations for Other Services       Precautions / Restrictions Precautions Precautions: Fall Restrictions Other Position/Activity Restrictions: Pt with findings of R clavicle fx on 01/11/20, sent home with shoulder immobilizer & told to f/u with ortho as outpatient but unable to do this since readmission to hospital. Therapy electing to maintain NWB, no ROM R  shoulder      Mobility Bed Mobility Overal bed mobility: Needs Assistance Bed Mobility: Supine to Sit;Sit to Supine     Supine to sit: Mod assist Sit to supine: Mod assist   General bed mobility comments: effortful, HOB elevated, use of bed rails    Transfers                 General transfer comment: NT    Balance   Sitting-balance support: Feet supported;Bilateral upper extremity supported Sitting balance-Leahy Scale: Fair Sitting balance - Comments: leaning L, c/o dizziness initially but able to maintain w/o direct assist for a few minutes       Standing balance comment: NT                           ADL either performed or assessed with clinical judgement   ADL                                         General ADL Comments: SETUP to MIN A for seated UB ADLs, MOD/MAX A for bed level for LB ADL, MOD A for sup<>sit.     Vision Baseline Vision/History: Wears glasses Wears Glasses: At all times Patient Visual Report: No change from baseline       Perception     Praxis      Pertinent Vitals/Pain Pain Assessment: Faces Faces Pain Scale: No hurt  Hand Dominance Right   Extremity/Trunk Assessment Upper Extremity Assessment Upper Extremity Assessment: Generalized weakness RUE Deficits / Details: recent clavicle fx, MMT deferred   Lower Extremity Assessment Lower Extremity Assessment: Defer to PT evaluation;Generalized weakness       Communication Communication Communication: Tracheostomy;Other (comment) (no PMV, and in addition, difficulty occluding trach noted on this admission more so that previous admissions. Appearance of potential ataxia when pt attempting to find the trach opening to occlude to speak.)   Cognition Arousal/Alertness: Awake/alert (does become drowsy towards end of session and falls asleep easily once assisted back to bed.) Behavior During Therapy: WFL for tasks assessed/performed Overall Cognitive  Status: No family/caregiver present to determine baseline cognitive functioning                                 General Comments: Pt more appropriate this session with mentation, but does wax/wane with drowsiness throughout and ultimately falls asleep very easily at end of session once returned to supine.   General Comments       Exercises Other Exercises Other Exercises: OT facilitates ed re: role of OT, importance of OOB Activity and engages pt in 5 mins in static EOB sitting to improve fxl sitting activity tolerance.   Shoulder Instructions      Home Living Family/patient expects to be discharged to:: Skilled nursing facility Living Arrangements: Spouse/significant other Available Help at Discharge: Family;Available 24 hours/day Type of Home: House Home Access: Ramped entrance     Home Layout: Other (Comment) (pt reports split level house, steps once inside)     Bathroom Shower/Tub: Walk-in shower         Home Equipment: Environmental consultant - 2 wheels;Wheelchair - manual;Cane - single point;Grab bars - toilet;Other (comment)          Prior Functioning/Environment Level of Independence: Independent with assistive device(s)        Comments: Pt reports that she rarely uses SPC, until 3 weeks ago was up and moving around with no AD in the home        OT Problem List: Decreased strength;Decreased range of motion;Decreased activity tolerance;Impaired balance (sitting and/or standing);Decreased safety awareness;Decreased cognition      OT Treatment/Interventions: Self-care/ADL training;Therapeutic exercise;Energy conservation;DME and/or AE instruction;Therapeutic activities;Patient/family education;Balance training    OT Goals(Current goals can be found in the care plan section) Acute Rehab OT Goals Patient Stated Goal: to get stronger (mouths words) OT Goal Formulation: With patient Time For Goal Achievement: 02/10/20 Potential to Achieve Goals: Fair ADL Goals Pt  Will Perform Grooming: with min guard assist;sitting Pt Will Perform Upper Body Dressing: with min assist;sitting Pt Will Perform Lower Body Dressing: with min assist;with mod assist;sitting/lateral leans Pt Will Transfer to Toilet: with mod assist;with max assist;stand pivot transfer;bedside commode (with LRAD PRN)  OT Frequency: Min 1X/week   Barriers to D/C:            Co-evaluation              AM-PAC OT "6 Clicks" Daily Activity     Outcome Measure Help from another person eating meals?: A Little Help from another person taking care of personal grooming?: A Little Help from another person toileting, which includes using toliet, bedpan, or urinal?: A Lot Help from another person bathing (including washing, rinsing, drying)?: A Lot Help from another person to put on and taking off regular upper body clothing?: A Little Help from another person to  put on and taking off regular lower body clothing?: A Lot 6 Click Score: 15   End of Session Nurse Communication: Mobility status  Activity Tolerance: Patient tolerated treatment well;Patient limited by fatigue Patient left: in bed;with call bell/phone within reach;with bed alarm set  OT Visit Diagnosis: Other abnormalities of gait and mobility (R26.89);Muscle weakness (generalized) (M62.81)                Time: 9400-0505 OT Time Calculation (min): 58 min Charges:  OT General Charges $OT Visit: 1 Visit OT Evaluation $OT Eval Moderate Complexity: 1 Mod OT Treatments $Self Care/Home Management : 23-37 mins $Therapeutic Activity: 23-37 mins  Gerrianne Scale, MS, OTR/L ascom [phone] 01/27/20, 5:02 PM

## 2020-01-27 NOTE — Progress Notes (Signed)
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    History of present illness   79 yo female presented to the emergency department from home because of family concerns regarding worsening altered mental status.  Upon arrival to the ED patient was unable to give any history due to lethargy.  She had a recent admission at [facility] for altered mental status and suspected aspiration pneumonia.  The patient was discharged 2 days ago on 01/21/2020 and family reported via ED documentation that the patient has undergone a drastic mental status change.  ED work-up included a CT head which was negative for any acute abnormality. ABG revealed respiratory acidosis with hypoxia and hypercarbia and lab work showed leukocytosis.  Initial lactic acid was unimpressive at 1.0, repeat 1.9, cultures and procalcitonin are pending.  Chest x-ray shows no significant interval change, with hazy lung markings and unchanged potential opacity vs small left lower lobe pleural effusion. PCCM team consulted for further management and monitoring with admission to the ICU.  Past Medical History  Tracheal stenosis s/p tracheostomy Breast Cancer - 2002 T2DM HLD Glaucoma GERD HTN   EVENTS 01/23/20 AMS in ED, hypercarbic- placed on ventilator > Admit to ICU 11/28 remains on vent, critically ill 11/30- Patient for IR guided Dob[place] due to NPO at this time. Plan for PEG 01/30/20 01/27/20-Patient is off ventilator on trache collar.  Optimizing for downgrade to medical floor.    CC  follow up respiratory failure  SUBJECTIVE Patient remains critically ill Prognosis is guarded On vent S/p trach  FiO2 (%):  [28 %] 28 %  CBC    Component Value Date/Time   WBC 9.1 01/27/2020 0614   RBC 4.50 01/27/2020 0614   HGB 11.7 (L) 01/27/2020 0614   HGB 14.4 12/21/2015 1040   HCT 37.7 01/27/2020  0614   HCT 42.4 12/21/2015 1040   PLT 170 01/27/2020 0614   PLT 241 12/21/2015 1040   MCV 83.8 01/27/2020 0614   MCV 87 12/21/2015 1040   MCV 82 12/17/2011 0650   MCH 26.0 01/27/2020 0614   MCHC 31.0 01/27/2020 0614   RDW 13.8 01/27/2020 0614   RDW 12.5 12/21/2015 1040   RDW 13.3 12/17/2011 0650   LYMPHSABS 1.3 01/27/2020 0614   LYMPHSABS 1.1 12/21/2015 1040   LYMPHSABS 0.9 (L) 12/17/2011 0650   MONOABS 0.7 01/27/2020 0614   MONOABS 0.1 (L) 12/17/2011 0650   EOSABS 0.1 01/27/2020 0614   EOSABS 0.1 12/21/2015 1040   EOSABS 0.0 12/17/2011 0650   BASOSABS 0.1 01/27/2020 0614   BASOSABS 0.0 12/21/2015 1040   BASOSABS 0.0 12/17/2011 0650   CMP Latest Ref Rng & Units 01/27/2020 01/26/2020 01/25/2020  Glucose 70 - 99 mg/dL 151(H) 184(H) 106(H)  BUN 8 - 23 mg/dL 7(L) 8 10  Creatinine 0.44 - 1.00 mg/dL 0.81 0.71 0.84  Sodium 135 - 145 mmol/L 139 143 140  Potassium 3.5 - 5.1 mmol/L 4.2 4.9 3.7  Chloride 98 - 111 mmol/L 100 107 108  CO2 22 - 32 mmol/L 29 28 25   Calcium 8.9 - 10.3 mg/dL 9.3 9.0 8.0(L)  Total Protein 6.5 - 8.1 g/dL - - -  Total Bilirubin 0.3 - 1.2 mg/dL - - -  Alkaline Phos 38 - 126 U/L - - -  AST 15 - 41 U/L - - -  ALT 0 - 44 U/L - - -    BP Marland Kitchen)  129/105   Pulse (!) 108   Temp 99.3 F (37.4 C)   Resp 20   Wt 74.6 kg   SpO2 100%   BMI 28.23 kg/m    I/O last 3 completed shifts: In: 711.7 [I.V.:711.7] Out: 4125 [Urine:4125] No intake/output data recorded.  SpO2: 100 % O2 Flow Rate (L/min): 5 L/min FiO2 (%): 28 %  Estimated body mass index is 28.23 kg/m as calculated from the following:   Height as of 01/15/20: 5\' 4"  (1.626 m).   Weight as of this encounter: 74.6 kg.     REVIEW OF SYSTEMS  PATIENT IS UNABLE TO PROVIDE COMPLETE REVIEW OF SYSTEMS DUE TO SEVERE CRITICAL ILLNESS        PHYSICAL EXAMINATION:  GENERAL:critically ill appearing, +resp distress HEAD: Normocephalic, atraumatic.  EYES: Pupils equal, round, reactive to light.  No  scleral icterus.  MOUTH: Moist mucosal membrane. NECK: Supple. S/p trach PULMONARY: +rhonchi, CARDIOVASCULAR: S1 and S2. Regular rate and rhythm. No murmurs, rubs, or gallops.  GASTROINTESTINAL: Soft, nontender, -distended.  Positive bowel sounds.   MUSCULOSKELETAL: No swelling, clubbing, or edema.  NEUROLOGIC: no FND grossly SKIN:intact,warm,dry  MEDICATIONS: I have reviewed all medications and confirmed regimen as documented   CULTURE RESULTS   Recent Results (from the past 240 hour(s))  Expectorated sputum assessment w rflx to resp cult     Status: None   Collection Time: 01/17/20  3:26 PM   Specimen: Sputum  Result Value Ref Range Status   Specimen Description   Final    SPUTUM Performed at [facility], [address] [place], [place] [address]    Special Requests   Final    NONE Performed at [facility], [address], [place], [place] [address]    Sputum evaluation   Final    THIS SPECIMEN IS ACCEPTABLE FOR SPUTUM CULTURE Performed at [facility], [place] [address] [place], [place] [address]    Report Status 01/17/2020 FINAL  Final  Culture, respiratory     Status: None   Collection Time: 01/17/20  3:26 PM   Specimen: SPU  Result Value Ref Range Status   Specimen Description SPUTUM  Final   Special Requests NONE Reflexed from [id number]  Final   Gram Stain   Final    MODERATE WBC PRESENT, PREDOMINANTLY PMN FEW GRAM POSITIVE RODS FEW GRAM NEGATIVE RODS FEW GRAM POSITIVE COCCI IN PAIRS IN CLUSTERS    Culture   Final    Normal respiratory flora-no Staph aureus or Pseudomonas seen Performed at [facility], [address], [place], [place] [address]    Report Status 01/19/2020 FINAL  Final  MRSA PCR Screening     Status: None   Collection Time: 01/17/20  5:00 PM   Specimen: Nasopharyngeal  Result Value Ref Range Status   MRSA by PCR NEGATIVE NEGATIVE Final    Comment:        The GeneXpert MRSA Assay (FDA approved for NASAL  specimens only), is one component of a comprehensive MRSA colonization surveillance program. It is not intended to diagnose MRSA infection nor to guide or monitor treatment for MRSA infections. Performed at [facility], [address] [place], [place] [address]   Blood culture (routine single)     Status: None (Preliminary result)   Collection Time: 01/23/20  8:27 PM   Specimen: BLOOD  Result Value Ref Range Status   Specimen Description BLOOD BLOOD LEFT HAND  Final   Special Requests  Final    BOTTLES DRAWN AEROBIC AND ANAEROBIC Blood Culture adequate volume   Culture   Final    NO GROWTH 4 DAYS Performed at [facility], [address] [place], [place] [address]    Report Status PENDING  Incomplete  Resp Panel by RT-PCR (Flu A&B, Covid) Nasopharyngeal Swab     Status: None   Collection Time: 01/23/20  8:27 PM   Specimen: Nasopharyngeal Swab; Nasopharyngeal(NP) swabs in vial transport medium  Result Value Ref Range Status   SARS Coronavirus 2 by RT PCR NEGATIVE NEGATIVE Final    Comment: (NOTE) SARS-CoV-2 target nucleic acids are NOT DETECTED.  The SARS-CoV-2 RNA is generally detectable in upper respiratory specimens during the acute phase of infection. The lowest concentration of SARS-CoV-2 viral copies this assay can detect is 138 copies/mL. A negative result does not preclude SARS-Cov-2 infection and should not be used as the sole basis for treatment or other patient management decisions. A negative result may occur with  improper specimen collection/handling, submission of specimen other than nasopharyngeal swab, presence of viral mutation(s) within the areas targeted by this assay, and inadequate number of viral copies(<138 copies/mL). A negative result must be combined with clinical observations, patient history, and epidemiological information. The expected result is Negative.  Fact Sheet for Patients:   EntrepreneurPulse.com.au  Fact Sheet for Healthcare Providers:  IncredibleEmployment.be  This test is no t yet approved or cleared by the [place] FDA and  has been authorized for detection and/or diagnosis of SARS-CoV-2 by FDA under an Emergency Use Authorization (EUA). This EUA will remain  in effect (meaning this test can be used) for the duration of the COVID-19 declaration under Section 564(b)(1) of the Act, 21 U.S.C.section 360bbb-3(b)(1), unless the authorization is terminated  or revoked sooner.       Influenza A by PCR NEGATIVE NEGATIVE Final   Influenza B by PCR NEGATIVE NEGATIVE Final    Comment: (NOTE) The Xpert Xpress SARS-CoV-2/FLU/RSV plus assay is intended as an aid in the diagnosis of influenza from Nasopharyngeal swab specimens and should not be used as a sole basis for treatment. Nasal washings and aspirates are unacceptable for Xpert Xpress SARS-CoV-2/FLU/RSV testing.  Fact Sheet for Patients: EntrepreneurPulse.com.au  Fact Sheet for Healthcare Providers: IncredibleEmployment.be  This test is not yet approved or cleared by the [place] FDA and has been authorized for detection and/or diagnosis of SARS-CoV-2 by FDA under an Emergency Use Authorization (EUA). This EUA will remain in effect (meaning this test can be used) for the duration of the COVID-19 declaration under Section 564(b)(1) of the Act, 21 U.S.C. section 360bbb-3(b)(1), unless the authorization is terminated or revoked.  Performed at [facility], [address], [place], [place] [address]   Urine culture     Status: None   Collection Time: 01/23/20  9:11 PM   Specimen: In/Out Cath Urine  Result Value Ref Range Status   Specimen Description   Final    IN/OUT CATH URINE Performed at [facility], [address] [place], [place] [address]    Special Requests   Final    NONE Performed  at [facility], [address] [place], [place] [address]    Culture   Final    NO GROWTH Performed at [facility], [place] [address] [place],  [address]    Report Status 01/25/2020 FINAL  Final  Culture, blood (single)     Status: None (Preliminary result)   Collection Time:  01/23/20 10:05 PM   Specimen: BLOOD  Result Value Ref Range Status   Specimen Description BLOOD BLOOD LEFT FOREARM  Final   Special Requests   Final    BOTTLES DRAWN AEROBIC ONLY Blood Culture results may not be optimal due to an inadequate volume of blood received in culture bottles   Culture   Final    NO GROWTH 4 DAYS Performed at [facility], [address] [place], [place] [address]    Report Status PENDING  Incomplete  MRSA PCR Screening     Status: None   Collection Time: 01/24/20  1:00 AM   Specimen: Nasopharyngeal  Result Value Ref Range Status   MRSA by PCR NEGATIVE NEGATIVE Final    Comment:        The GeneXpert MRSA Assay (FDA approved for NASAL specimens only), is one component of a comprehensive MRSA colonization surveillance program. It is not intended to diagnose MRSA infection nor to guide or monitor treatment for MRSA infections. Performed at [facility], [address] [place], [place] [address]   Culture, respiratory (non-expectorated)     Status: None   Collection Time: 01/24/20 11:58 AM   Specimen: SPU; Respiratory  Result Value Ref Range Status   Specimen Description   Final    SPUTUM Performed at [facility], [address] [place], [place] [address]    Special Requests   Final    NONE Performed at [facility], [place], [place] [address]    Gram Stain NO WBC SEEN NO ORGANISMS SEEN   Final   Culture   Final    RARE Normal respiratory flora-no Staph aureus or Pseudomonas seen Performed at [facility], [place] [address], [place], [place] [address]    Report Status 01/26/2020 FINAL   Final          IMAGING    No results found.   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    Noncompliant with Vent for last 2 years, signs and symptoms of aspiration Will likely need PEG tube  Severe ACUTE Hypoxic and Hypercapnic Respiratory Failure -Trache status - no signs of tracheitis -patient does have Left lung base infiltrate and tracheal aspirate is in process - due to higher risk of recurrent aspiration - have consulted with surgery regarding PEG placement - Surgery on case - Dr Dahlia Byes - possible Friday PEG    Aspiration pneumonia   - recurrent aspiration    - s/p speech and swallow evaluation - patient aspirates    - plan for NGT and PEG   - c/w abx    - Surgery on case - appreciate input - for OR 12/4   GI GI PROPHYLAXIS as indicated  NUTRITIONAL STATUS Nutrition Status: Nutrition Problem: Inadequate oral intake Etiology: inability to eat Signs/Symptoms: NPO status Interventions: Tube feeding   DIET-->NPO Constipation protocol as indicated NG in place  ENDO - will  use ICU hypoglycemic\Hyperglycemia protocol if indicated     ELECTROLYTES -follow labs as needed -replace as needed -pharmacy consultation and following   DVT/GI PRX ordered and assessed TRANSFUSIONS AS NEEDED MONITOR FSBS I Assessed the need for Labs I Assessed the need for Foley I Assessed  the need for Central Venous Line Family Discussion when available I Assessed the need for Mobilization I made an Assessment of medications to be adjusted accordingly Safety Risk assessment completed   CASE DISCUSSED IN MULTIDISCIPLINARY ROUNDS WITH ICU TEAM  Critical care provider statement:    Critical care time (minutes):  33   Critical care time was exclusive of:  Separately billable procedures and  treating other patients   Critical care was necessary to treat or prevent imminent or  life-threatening deterioration of the following conditions:  acute hypoxemic respiratory failure, left lung pneumonia, aspiration , tracheostomy status   Critical care was time spent personally by me on the following  activities:  Development of treatment plan with patient or surrogate,  discussions with consultants, evaluation of patient's response to  treatment, examination of patient, obtaining history from patient or  surrogate, ordering and performing treatments and interventions, ordering  and review of laboratory studies and re-evaluation of patient's condition   I assumed direction of critical care for this patient from another  provider in my specialty: no       Ottie Glazier, M.D.  Pulmonary & [place]

## 2020-01-27 NOTE — Progress Notes (Signed)
Patient continued to refuse NG tube placement overnight. She is pleasant and A+Ox4 with intermittent forgetfulness. Patient was reminded about her plan of care (PEG placement on 12/4). She is resting comfortably and is in no distress. She remains NPO. Will continue to monitor.  [place] Ali, RN

## 2020-01-27 NOTE — Consult Note (Signed)
[place] for Electrolyte Monitoring and Replacement   Recent Labs: Potassium (mmol/L)  Date Value  01/27/2020 4.2  12/17/2011 4.1   Magnesium (mg/dL)  Date Value  01/27/2020 1.9  12/17/2011 2.2   Calcium (mg/dL)  Date Value  01/27/2020 9.3   Calcium, Total (mg/dL)  Date Value  12/17/2011 8.8   Albumin (g/dL)  Date Value  01/23/2020 4.0  08/28/2016 4.3  12/16/2011 4.1   Phosphorus (mg/dL)  Date Value  01/27/2020 2.8  12/16/2011 2.2 (L)   Sodium (mmol/L)  Date Value  01/27/2020 139  08/28/2016 143  12/17/2011 139    Assessment: Patient is a 79 y/o F with medical history including history of breast cancer, diabetes, GERD, tracheal stenosis s/p tracheostomy who presented to the ED 11/27 with unresponsiveness. Patient was recently admitted 10/27 thru 11/19 with altered mental status and pneumonia. Pharmacy has been consulted to assist with monitoring and repletion of electrolytes as indicated.    Goal of Therapy:  Potassium 4.0 - 5.1 mmol/L Magnesium 2.0 - 2.4 mg/dL All other electrolytes within normal limits  Plan:   No electrolyte replacement warranted  Follow-up electrolytes with AM labs tomorrow  Angela David 01/27/2020 8:45 AM

## 2020-01-27 NOTE — Progress Notes (Signed)
Daily Progress Note   Patient Name: Angela David       Date: 01/27/2020 DOB: 12-08-1940  Age: 79 y.o. MRN#: [id number] Attending Physician: Flora Lipps, MD Primary Care Physician: Iva Boop, MD Admit Date: 01/23/2020  Reason for Consultation/Follow-up: Establishing goals of care  Subjective: Patient is resting in bed. She has TC in place. She attempts to occlude her trach to speak but her finger does not completely cover the opening, and she is having difficulty today finding the hole in the inner cannula to cover. Assisted her multiple times to locate the cannula opening.   We discussed the plans for her PEG later this week. We discussed her oral intake prior to this admission for the 2 days she was home. We discussed that she has removed multiple nasogastric feeding tubes this admission, and discussed concerns for removal or accidental removal of the PEG. We discussed QOL. She states she would like to try the PEG. We discussed that if she removes the PEG, we will need to have further discussions on care moving forward. Called OT to see if something could be created to help her occlude her trach so she can speak, since her current trach will not maintain a PMV when she tries to speak through it, and she is having difficulty finger occluding.    Per patient, called husband to update. He states she will have to understand either she will make sacrifices such as being completely NPO, or she will face readmissions and potentially death.   Length of Stay: 4  Current Medications: Scheduled Meds:  . allopurinol  100 mg Per NG tube Daily  . busPIRone  30 mg Per NG tube BID  . chlorhexidine gluconate (MEDLINE KIT)  15 mL Mouth Rinse BID  . Chlorhexidine Gluconate Cloth  6 each  Topical Daily  . enoxaparin (LOVENOX) injection  40 mg Subcutaneous Q24H  . escitalopram  30 mg Per Tube Daily  . feeding supplement (PROSource TF)  90 mL Per Tube BID  . feeding supplement (VITAL 1.5 CAL)  1,000 mL Per Tube Q24H  . folic acid  1 mg Per Tube Daily  . gabapentin  600 mg Per Tube Q8H  . mouth rinse  15 mL Mouth Rinse 10 times per day  . melatonin  5 mg Per Tube QHS  .  multivitamin with minerals  1 tablet Per Tube Daily  . pantoprazole (PROTONIX) IV  40 mg Intravenous QHS  . pravastatin  20 mg Per NG tube q1800  . rOPINIRole  0.5 mg Per Tube QHS  . scopolamine  1 patch Transdermal Q72H    Continuous Infusions: . sodium chloride Stopped (01/24/20 0935)    PRN Meds: acetaminophen, acetaminophen, albuterol, ondansetron (ZOFRAN) IV  Physical Exam Pulmonary:     Comments: On TC.  Neurological:     Mental Status: She is alert.             Vital Signs: BP (!) 129/105   Pulse (!) 108   Temp 99.3 F (37.4 C)   Resp 20   Wt 74.6 kg   SpO2 100%   BMI 28.23 kg/m  SpO2: SpO2: 100 % O2 Device: O2 Device: Tracheostomy Collar O2 Flow Rate: O2 Flow Rate (L/min): 5 L/min  Intake/output summary:   Intake/Output Summary (Last 24 hours) at 01/27/2020 1524 Last data filed at 01/27/2020 0141 Gross per 24 hour  Intake 0 ml  Output 950 ml  Net -950 ml   LBM: Last BM Date: 02/25/20 Baseline Weight: Weight: 74.6 kg Most recent weight: Weight: 74.6 kg       Palliative Assessment/Data:      Patient Active Problem List   Diagnosis Date Noted  . Acute respiratory failure ([place]) 01/23/2020  . Left lower lobe pneumonia 01/16/2020  . 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 Drug-induced polyneuropathy ([place]) 05/05/2019  . 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 Weakness   . Hypoxia   . Acute respiratory failure with hypoxia and hypercapnia ([place]) 03/21/2019  . Hypotension 03/21/2019  . Acute encephalopathy 03/21/2019  . Multiple fractures of  ribs, right side, initial encounter for closed fracture 12/25/2018  . Colon polyp 11/06/2018  . Esophageal dysphagia   . 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 Stricture and stenosis of esophagus   . Elevated CEA 09/02/2018  . Goals of care, counseling/discussion 09/02/2018  . Polyp of transverse colon 09/02/2018  . Foreign body in right foot 05/26/2018  . History of tracheostomy 04/30/2018  . Moderate episode of recurrent major depressive disorder ([place]) 04/03/2018  . Poor balance 04/03/2018  . Shoulder lesion, right 02/28/2017  . Cervical radiculopathy 09/28/2016  . Peripheral neuropathy 08/28/2016  . 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 Leg weakness, bilateral 08/13/2016  . Gait abnormality 08/13/2016  . Carcinoma of overlapping sites of right breast in female, estrogen receptor negative ([place]) 12/27/2015  . Malignant neoplasm of breast ([place]) 03/28/2015  . 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 Hypercholesterolemia 03/28/2015  . Recurrent major depressive disorder, in partial remission ([place]) 03/01/2015  . 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 Cephalalgia 09/15/2014  . Headache due to trauma 09/15/2014  . Osteopenia 01/07/2014  . Age-related osteoporosis without current pathological fracture 01/07/2014  . History of colonic polyps 12/14/2013  . History of breast cancer 11/26/2012  . H/O malignant neoplasm of breast 11/26/2012  . 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 Tracheostomy present ([place]) 12/06/2011  . Calcification of bronchus or trachea 01/16/2011  . 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 Anxiety state 12/19/2010  . Blood in sputum 12/19/2010  . HLD (hyperlipidemia) 12/19/2010  . Type 2 diabetes mellitus ([place]) 12/19/2010    Palliative Care Assessment & Plan    Recommendations/Plan:  PEG placement at the end of the week.     Code Status:    Code Status  Orders  (From admission, onward)         Start     Ordered    01/26/20 1058  Limited resuscitation (code)  Continuous       Question Answer Comment  In the event of cardiac or respiratory ARREST: Initiate Code Blue, Call Rapid Response Yes   In the event of cardiac or respiratory ARREST: Perform CPR No   In the event of cardiac or respiratory ARREST: Perform Intubation/Mechanical Ventilation Yes   In the event of cardiac or respiratory ARREST: Use NIPPV/BiPAp only if indicated Yes   In the event of cardiac or respiratory ARREST: Administer ACLS medications if indicated Yes   In the event of cardiac or respiratory ARREST: Perform Defibrillation or Cardioversion if indicated Yes   Comments MOST form on chart. If she is found not breathing or with no heartbeat, she wishes to be left that way with no intervention.      01/26/20 1058        Code Status History    Date Active Date Inactive Code Status Order ID Comments User Context   01/23/2020 2152 01/26/2020 1058 Full Code [id number]  Rust-Chester, Huel Cote, NP ED   01/15/2020 2239 01/21/2020 2131 Full Code [id number]  CoxBriant Cedar, DO ED   03/21/2019 0108 04/01/2019 2227 Full Code [id number]  Awilda Bill, NP ED   12/26/2018 0336 12/31/2018 2219 Full Code [id number]  Tylene Fantasia, PA-C ED   11/06/2018 1233 11/09/2018 1917 Full Code [id number]  Jules Husbands, MD Inpatient   Advance Care Planning Activity       Prognosis:   Unable to determine    Care plan was discussed with OT.   Thank you for allowing the Palliative Medicine Team to assist in the care of this patient.   Total Time 50 min Prolonged Time Billed  no      Greater than 50%  of this time was spent counseling and coordinating care related to the above assessment and plan.  Asencion Gowda, NP  Please contact Palliative Medicine Team phone at [phone] for questions and concerns.

## 2020-01-28 ENCOUNTER — Inpatient Hospital Stay: Admit: 2020-01-28 | Payer: Medicare Other

## 2020-01-28 DIAGNOSIS — J9600 Acute respiratory failure, unspecified whether with hypoxia or hypercapnia: Secondary | ICD-10-CM

## 2020-01-28 DIAGNOSIS — J690 Pneumonitis due to inhalation of food and vomit: Secondary | ICD-10-CM

## 2020-01-28 DIAGNOSIS — G9341 Metabolic encephalopathy: Secondary | ICD-10-CM

## 2020-01-28 DIAGNOSIS — T17908A Unspecified foreign body in respiratory tract, part unspecified causing other injury, initial encounter: Secondary | ICD-10-CM

## 2020-01-28 LAB — GLUCOSE, CAPILLARY
Glucose-Capillary: 108 mg/dL — ABNORMAL HIGH (ref 70–99)
Glucose-Capillary: 119 mg/dL — ABNORMAL HIGH (ref 70–99)
Glucose-Capillary: 122 mg/dL — ABNORMAL HIGH (ref 70–99)
Glucose-Capillary: 130 mg/dL — ABNORMAL HIGH (ref 70–99)
Glucose-Capillary: 143 mg/dL — ABNORMAL HIGH (ref 70–99)
Glucose-Capillary: 144 mg/dL — ABNORMAL HIGH (ref 70–99)

## 2020-01-28 LAB — CBC WITH DIFFERENTIAL/PLATELET
Abs Immature Granulocytes: 0.04 10*3/uL (ref 0.00–0.07)
Basophils Absolute: 0 10*3/uL (ref 0.0–0.1)
Basophils Relative: 0 %
Eosinophils Absolute: 0.1 10*3/uL (ref 0.0–0.5)
Eosinophils Relative: 1 %
HCT: 41.5 % (ref 36.0–46.0)
Hemoglobin: 13.1 g/dL (ref 12.0–15.0)
Immature Granulocytes: 0 %
Lymphocytes Relative: 14 %
Lymphs Abs: 1.5 10*3/uL (ref 0.7–4.0)
MCH: 26.5 pg (ref 26.0–34.0)
MCHC: 31.6 g/dL (ref 30.0–36.0)
MCV: 84 fL (ref 80.0–100.0)
Monocytes Absolute: 0.5 10*3/uL (ref 0.1–1.0)
Monocytes Relative: 5 %
Neutro Abs: 8.6 10*3/uL — ABNORMAL HIGH (ref 1.7–7.7)
Neutrophils Relative %: 80 %
Platelets: 202 10*3/uL (ref 150–400)
RBC: 4.94 MIL/uL (ref 3.87–5.11)
RDW: 13.6 % (ref 11.5–15.5)
Smear Review: NORMAL
WBC: 10.8 10*3/uL — ABNORMAL HIGH (ref 4.0–10.5)
nRBC: 0 % (ref 0.0–0.2)

## 2020-01-28 LAB — CULTURE, BLOOD (SINGLE)
Culture: NO GROWTH
Culture: NO GROWTH
Special Requests: ADEQUATE

## 2020-01-28 LAB — BASIC METABOLIC PANEL
Anion gap: 12 (ref 5–15)
BUN: 11 mg/dL (ref 8–23)
CO2: 27 mmol/L (ref 22–32)
Calcium: 9.4 mg/dL (ref 8.9–10.3)
Chloride: 99 mmol/L (ref 98–111)
Creatinine, Ser: 0.76 mg/dL (ref 0.44–1.00)
GFR, Estimated: >60 mL/min (ref >60)
Glucose, Bld: 162 mg/dL — ABNORMAL HIGH (ref 70–99)
Potassium: 3.9 mmol/L (ref 3.5–5.1)
Sodium: 138 mmol/L (ref 135–145)

## 2020-01-28 LAB — MAGNESIUM: Magnesium: 2.1 mg/dL (ref 1.7–2.4)

## 2020-01-28 LAB — PHOSPHORUS: Phosphorus: 3.1 mg/dL (ref 2.5–4.6)

## 2020-01-28 MED ORDER — KCL IN DEXTROSE-NACL 20-5-0.45 MEQ/L-%-% IV SOLN
INTRAVENOUS | Status: DC
  Filled 2020-01-28 (×6): qty 1000

## 2020-01-28 MED ORDER — SODIUM CHLORIDE 0.9 % IV BOLUS
500.0000 mL | Freq: Once | INTRAVENOUS | Status: AC
  Administered 2020-01-28: 500 mL via INTRAVENOUS

## 2020-01-28 MED ORDER — CHLORHEXIDINE GLUCONATE CLOTH 2 % EX PADS
6.0000 | MEDICATED_PAD | Freq: Every day | CUTANEOUS | Status: DC
  Administered 2020-01-28 – 2020-02-13 (×11): 6 via TOPICAL

## 2020-01-28 MED ORDER — ORAL CARE MOUTH RINSE
15.0000 mL | Freq: Two times a day (BID) | OROMUCOSAL | Status: DC
  Administered 2020-01-28 – 2020-01-31 (×5): 15 mL via OROMUCOSAL

## 2020-01-28 MED ORDER — SODIUM CHLORIDE 0.9 % IV SOLN
2.0000 g | INTRAVENOUS | Status: AC
  Administered 2020-01-29: 2 g via INTRAVENOUS
  Filled 2020-01-28 (×2): qty 2

## 2020-01-28 NOTE — Progress Notes (Signed)
°   01/28/20 1818  Intake (mL)  P.O. 0 mL  Percent Meals Eaten (%) 0 %  0.9 %  sodium chloride infusion  Volume (mL) 0 mL  dextrose 5 % and 0.45 % NaCl with KCl 20 mEq/L infusion  Volume (mL) 115 mL  Output (mL)  Urine 0 mL  Stool 0 mL  Urine Characteristics  Urinary Interventions Bladder scan  Bladder Scan Volume (mL) 114 mL   Per Dr. Roosevelt Locks, rescan bladder in 3 hours. If residual is > 244mL, in an out cath patient.   Fuller Mandril, RN

## 2020-01-28 NOTE — Progress Notes (Signed)
Physical Therapy Treatment Patient Details Name: Angela David MRN: [id number] DOB: 18-Apr-1940 Today's Date: 01/28/2020    History of Present Illness Pt is a 79 y/o F her 11/19 with c/o hallucinations x 1 week. Imaging negative. Pt admitted for tx of acute encephalopathy of unclear etiology but suspect due to RLL PNA. (Pt also recently seen in ED on 01/11/20 s/p fall & found to have R clavicular fx & sent home with R shoulder immobilizer, instructed to f/u with ortho on OP basis.) PMH: R breast CA (2002), DM, peripheral neuropathy, glaucoma, high cholesterol, vocal cord paralysis    PT Comments    Pt was long sitting in bed with slight cervical spine rotation and L horizontal abduction. Per spouse has been this away for awhile. Author was easily able to read pt's lips.Pt on trach collar throughout session with sao2 >90% .  Pt reports feeling ok but a little tired. Very agreeable and cooperative for session. Required MOD assist to exit bed, stand from elevated bed height, and take 2 side steps from FOB to [facility]. Pt 's HR did elevated to upper 130s with standing activity. Lengthy discussion about DC disposition. Both pt/pt's spouse in agreement that rehab is safest option. Recommend DC to SNF to address strength, gait, and endurance deficits. RN and spouse at bedside at conclusion of PT session.    Follow Up Recommendations  SNF;Supervision/Assistance - 24 hour     Equipment Recommendations  None recommended by PT;Other (comment) (per spouse, pt has all equipment needs met)       Precautions / Restrictions Precautions Precautions: Fall Restrictions Weight Bearing Restrictions: No    Mobility  Bed Mobility Overal bed mobility: Needs Assistance Bed Mobility: Supine to Sit;Sit to Supine Rolling: Min assist;Mod assist   Supine to sit: Mod assist Sit to supine: Mod assist      Transfers Overall transfer level: Needs assistance Equipment used: Rolling walker (2  wheeled) Transfers: Sit to/from Stand Sit to Stand: Mod assist;From elevated surface         General transfer comment: pt stood 3 x EOB throughout session. able to stand for ~ 1 minute prior to requesting seated rest. sao2 stable however HR elevated to 137 bpm quickly upon standing  Ambulation/Gait Ambulation/Gait assistance: Mod assist Gait Distance (Feet): 2 Feet Assistive device: Rolling walker (2 wheeled)   Gait velocity: decrease    General Gait Details: Was able to take a few side steps from FOB to [facility] with mod assist       Balance Overall balance assessment: Needs assistance Sitting-balance support: Feet supported;Bilateral upper extremity supported Sitting balance-Leahy Scale: Fair     Standing balance support: During functional activity;Bilateral upper extremity supported Standing balance-Leahy Scale: Poor Standing balance comment: posterior push        Cognition Arousal/Alertness: Awake/alert Behavior During Therapy: WFL for tasks assessed/performed Overall Cognitive Status: Within Functional Limits for tasks assessed    General Comments: spouse present throughout session. reports cognition is at baseline. very supportive spouse. did discussed rehab option at DC and both pt/pt's psouse agree rehab is safest option             Pertinent Vitals/Pain Pain Assessment: Faces Faces Pain Scale: Hurts a little bit Pain Location: R shoulder Pain Descriptors / Indicators: Aching;Discomfort;Dull Pain Intervention(s): Limited activity within patient's tolerance;Monitored during session;Premedicated before session;Repositioned           PT Goals (current goals can now be found in the care plan section) Acute Rehab PT  Goals Patient Stated Goal: go home once safe Progress towards PT goals: Progressing toward goals    Frequency    Min 2X/week      PT Plan Current plan remains appropriate    Co-evaluation     PT goals addressed during session:  Mobility/safety with mobility;Balance;Proper use of DME;Strengthening/ROM        AM-PAC PT "6 Clicks" Mobility   Outcome Measure  Help needed turning from your back to your side while in a flat bed without using bedrails?: A Lot Help needed moving from lying on your back to sitting on the side of a flat bed without using bedrails?: A Lot Help needed moving to and from a bed to a chair (including a wheelchair)?: A Lot Help needed standing up from a chair using your arms (e.g., wheelchair or bedside chair)?: A Lot Help needed to walk in hospital room?: A Lot Help needed climbing 3-5 steps with a railing? : Total 6 Click Score: 11    End of Session Equipment Utilized During Treatment: Gait belt Activity Tolerance: Patient tolerated treatment well;Patient limited by fatigue Patient left: in bed;with bed alarm set;with call bell/phone within reach;with family/visitor present;with nursing/sitter in room Nurse Communication: Mobility status PT Visit Diagnosis: Muscle weakness (generalized) (M62.81);History of falling (Z91.81);Unsteadiness on feet (R26.81);Difficulty in walking, not elsewhere classified (R26.2)     Time: 5146-0479 PT Time Calculation (min) (ACUTE ONLY): 27 min  Charges:  $Therapeutic Activity: 23-37 mins                     Julaine Fusi PTA 01/28/20, 6:09 PM

## 2020-01-28 NOTE — Progress Notes (Signed)
Nutrition Follow Up Note   DOCUMENTATION CODES:   Not applicable  INTERVENTION:   Once G-tube in place and ready for use, recommend:  Osmolite 1.5 _0 /hr- Initiate at 53m/hr and increase by 137mhr q12 hours until goal rate is reached  Pro-Source 4570maily via tube, provides 40kcal and 11g of protein per serving   Free water flushes 100m65m hours   Regimen provides 1840kcal/day, 86g/day protein and 1514ml33m free water   Pt at moderate refeed risk; recommend monitor potassium, magnesium and phosphorus labs daily until stable  NUTRITION DIAGNOSIS:   Inadequate oral intake related to inability to eat as evidenced by NPO status. -ongoing   GOAL:   Patient will meet greater than or equal to 90% of their needs  -not met   MONITOR:   Labs, Weight trends, TF tolerance, Skin, I & O's  ASSESSMENT:   79 ye60 old female with PMHx of breast cancer s/p lumpectomy and chemo/XRT, DM, diabetic peripheral neuropathy, GERD, vocal cord paralysis, tracheal stenosis requiring chronic tracheostomy, recent admission 11/19-11/25 admitted with acute hypoxic and hypercapnic respiratory failure secondary to suspected aspiration PNA, acute encephalopathy.   Pt having some intermittent confusion. Pt removed numerous nasogastric feeding tubes; last tube replaced 11/30 and then pt removed it shortly after placement. Pt refusing to have tube replaced. Plan is for surgical G-tube placement 12/3. Tube feeds will likely be restarted 12/4. Pt is likely at moderate refeed risk. No new weight since admit; RD will request weekly weights.   Medications reviewed and include: allopurinol, lovenox, folic acid, melatonin, protonix, cefotetan, NaCl w/ 5% dextrose and KCl _1 /hr  Labs reviewed: K 3.9 wnl, P 3.1 wnl, Mg 2.1 wnl Wbc- 10.8(H) cbgs- 144, 143 x 24 hrs  Diet Order:   Diet Order            Diet NPO time specified  Diet effective now                EDUCATION NEEDS:   No education needs  have been identified at this time  Skin:  Skin Assessment: Reviewed RN Assessment  Last BM:  11/30- type 7  Height:   Ht Readings from Last 1 Encounters:  01/15/20 _2  (1.626 m)   Weight:   Wt Readings from Last 1 Encounters:  01/24/20 74.6 kg   Ideal Body Weight:  54.5 kg  BMI:  Body mass index is 28.23 kg/m.  Estimated Nutritional Needs:   Kcal:  1600-1800kcal/day  Protein:  80-90g/day  Fluid:  1.6L/day  CaseyKoleen DistanceRD, LDN Please refer to [facility] and/or RD on-call/weekend/after hours pager

## 2020-01-28 NOTE — Care Management Important Message (Signed)
Important Message  Patient Details  Name: Angela David MRN: [id number] Date of Birth: Nov 03, 1940   Medicare Important Message Given:  Yes     [name] 01/28/2020, 2:54 PM

## 2020-01-28 NOTE — Progress Notes (Signed)
PROGRESS NOTE    Angela David  JME:[id number] DOB: 1940-05-05 DOA: 01/23/2020 PCP: Iva Boop, MD   Chief complaint. Shortness of breath Brief Narrative:  34 yofemale presentedto the emergency department from [facility] of family concerns regarding worsening altered mental status.Upon arrival to the ED patient was unable to give any history due to lethargy.She hada recent admission at [facility] for altered mental status and suspected aspiration pneumonia.The patient was discharged 2 days ago on 01/21/2020 and family reported via ED documentation that the patient has undergone a drastic mental status change.ED work-up included aCT headwhich was negative for any acute abnormality.ABG revealed respiratory acidosis with hypoxia and hypercarbia and lab work showed leukocytosis. Initial lactic acid was unimpressive at 1.0,repeat 1.9,cultures and procalcitonin are pending.Chest x-ray shows no significant interval change, withhazy lung markings and unchanged potentialopacityvssmall left lower lobe pleural effusion. Patient has a chronic tracheostomy, she was placed on mechanical ventilation on 11/27. She is also fed with a Dobbhoff. She is off the ventilator on 12/1. Currently on oxygen via trach collar.   Assessment & Plan:   Active Problems:   Acute respiratory failure ([place])  #1. Acute on chronic hypoxemic respiratory failure Status post tracheostomy. Bilateral lower lobe aspiration pneumonia. Patient condition had improved. Currently on oxygen via trach collar. Continue antibiotics with Cefotan. Patient is not able to take any p.o. Scheduled for PEG tube placement on 12/4. We will start gentle rehydration.  #2. Chronic diastolic congestive heart failure. Reviewed echocardiogram performed 11/21, ejection fraction 60 to 65%. Currently patient does not have any evidence of volume overload.  3. Acute metabolic encephalopathy. Condition had improved.  Patient nonverbal, but does not seem to have any confusion.  4. Paroxysmal atrial fibrillation. Not chronically on anticoagulation. Heart rate is stable.    DVT prophylaxis: Lovenox Code Status: Partial Family Communication: None Disposition Plan:  .   Status is: Inpatient  Remains inpatient appropriate because:Inpatient level of care appropriate due to severity of illness   Dispo: The patient is from: Home              Anticipated d/c is to: Home              Anticipated d/c date is: 3 days              Patient currently is not medically stable to d/c.        I/O last 3 completed shifts: In: 0  Out: 1500 [Urine:1500] No intake/output data recorded.     Consultants:   Pulmonology, GS  Procedures: Pending G-tube   Antimicrobials: Cefotan   Subjective: Patient still on oxygen with trach collar, short of breath with exertion. No cough. No fever or chills. No abdominal pain or nausea vomiting. He is n.p.o. pending G-tube placement. No dysuria hematuria, has a Foley catheter in place. No headache or dizziness  Objective: Vitals:   01/28/20 0040 01/28/20 0415 01/28/20 0540 01/28/20 0749  BP:   (!) 158/90 (!) 162/89  Pulse:   (!) 104 (!) 108  Resp:   16 20  Temp:   (!) 97.5 F (36.4 C) 98.8 F (37.1 C)  TempSrc:   Oral Axillary  SpO2: 100% 99% 98% 100%  Weight:        Intake/Output Summary (Last 24 hours) at 01/28/2020 1051 Last data filed at 01/28/2020 0500 Gross per 24 hour  Intake --  Output 1100 ml  Net -1100 ml   Filed Weights   01/24/20 0407  Weight: 74.6 kg  Examination:  General exam: Appears calm and comfortable  Respiratory system: Decreased breathing sounds without crackles or wheezes.Marland Kitchen Respiratory effort normal. Cardiovascular system: Irregular, mildly tachycardic, no JVD, murmurs, rubs, gallops or clicks. No pedal edema. Gastrointestinal system: Abdomen is nondistended, soft and nontender. No organomegaly or masses felt. Normal  bowel sounds heard. Central nervous system: Alert and oriented. No focal neurological deficits. Extremities: Symmetric 5 x 5 power. Skin: No rashes, lesions or ulcers Psychiatry: Mood & affect appropriate.     Data Reviewed: I have personally reviewed following labs and imaging studies  CBC: Recent Labs  Lab 01/23/20 2027 01/23/20 2027 01/24/20 0230 01/25/20 0640 01/26/20 0525 01/27/20 0614 01/28/20 0500  WBC 12.9*   < > 12.4* 7.0 9.0 9.1 10.8*  NEUTROABS 10.9*  --   --   --   --  6.9 8.6*  HGB 13.4   < > 11.7* 9.6* 11.0* 11.7* 13.1  HCT 42.3   < > 36.3 31.0* 35.8* 37.7 41.5  MCV 82.1   < > 81.2 85.2 85.0 83.8 84.0  PLT 277   < > 236 147* 147* 170 202   < > = values in this interval not displayed.   Basic Metabolic Panel: Recent Labs  Lab 01/24/20 0120 01/24/20 0120 01/24/20 1611 01/24/20 1611 01/25/20 0640 01/25/20 0640 01/25/20 1722 01/26/20 0525 01/26/20 1726 01/27/20 0614 01/28/20 0500  NA 140  --   --   --  140  --   --  143  --  139 138  K 3.4*   < > 3.2*  --  3.7  --   --  4.9  --  4.2 3.9  CL 105  --   --   --  108  --   --  107  --  100 99  CO2 23  --   --   --  25  --   --  28  --  29 27  GLUCOSE 122*  --   --   --  106*  --   --  184*  --  151* 162*  BUN 14  --   --   --  10  --   --  8  --  7* 11  CREATININE 0.64  --   --   --  0.84  --   --  0.71  --  0.81 0.76  CALCIUM 7.7*  --   --   --  8.0*  --   --  9.0  --  9.3 9.4  MG 1.3*   < > 3.0*   < > 2.5*   < > 2.4 2.1 1.9 1.9 2.1  PHOS 1.5*   < > 3.1   < > 2.0*   < > 2.0* 3.0 2.5 2.8 3.1   < > = values in this interval not displayed.   GFR: Estimated Creatinine Clearance: 58.3 mL/min (by C-G formula based on SCr of 0.76 mg/dL). Liver Function Tests: Recent Labs  Lab 01/23/20 2027  AST 22  ALT 25  ALKPHOS 69  BILITOT 0.6  PROT 8.0  ALBUMIN 4.0   No results for input(s): LIPASE, AMYLASE in the last 168 hours. Recent Labs  Lab 01/24/20 0230  AMMONIA 19   Coagulation Profile: Recent Labs   Lab 01/23/20 2027  INR 1.1   Cardiac Enzymes: No results for input(s): CKTOTAL, CKMB, CKMBINDEX, TROPONINI in the last 168 hours. BNP (last 3 results) No results for input(s): PROBNP in the last 8760 hours.  HbA1C: No results for input(s): HGBA1C in the last 72 hours. CBG: Recent Labs  Lab 01/27/20 1621 01/27/20 2028 01/27/20 2311 01/28/20 0538 01/28/20 0735  GLUCAP 117* 130* 99 144* 143*   Lipid Profile: No results for input(s): CHOL, HDL, LDLCALC, TRIG, CHOLHDL, LDLDIRECT in the last 72 hours. Thyroid Function Tests: No results for input(s): TSH, T4TOTAL, FREET4, T3FREE, THYROIDAB in the last 72 hours. Anemia Panel: No results for input(s): VITAMINB12, FOLATE, FERRITIN, TIBC, IRON, RETICCTPCT in the last 72 hours. Sepsis Labs: Recent Labs  Lab 01/23/20 2027 01/23/20 2205 01/24/20 0120 01/24/20 0230 01/25/20 0640  PROCALCITON <0.10  --  <0.10  --  <0.10  LATICACIDVEN 1.0 1.9 7.2* 2.4*  --     Recent Results (from the past 240 hour(s))  Blood culture (routine single)     Status: None   Collection Time: 01/23/20  8:27 PM   Specimen: BLOOD  Result Value Ref Range Status   Specimen Description BLOOD BLOOD LEFT HAND  Final   Special Requests   Final    BOTTLES DRAWN AEROBIC AND ANAEROBIC Blood Culture adequate volume   Culture   Final    NO GROWTH 5 DAYS Performed at [facility], [address] [place], [place] [address]    Report Status 01/28/2020 FINAL  Final  Resp Panel by RT-PCR (Flu A&B, Covid) Nasopharyngeal Swab     Status: None   Collection Time: 01/23/20  8:27 PM   Specimen: Nasopharyngeal Swab; Nasopharyngeal(NP) swabs in vial transport medium  Result Value Ref Range Status   SARS Coronavirus 2 by RT PCR NEGATIVE NEGATIVE Final    Comment: (NOTE) SARS-CoV-2 target nucleic acids are NOT DETECTED.  The SARS-CoV-2 RNA is generally detectable in upper respiratory specimens during the acute phase of infection. The lowest concentration of  SARS-CoV-2 viral copies this assay can detect is 138 copies/mL. A negative result does not preclude SARS-Cov-2 infection and should not be used as the sole basis for treatment or other patient management decisions. A negative result may occur with  improper specimen collection/handling, submission of specimen other than nasopharyngeal swab, presence of viral mutation(s) within the areas targeted by this assay, and inadequate number of viral copies(<138 copies/mL). A negative result must be combined with clinical observations, patient history, and epidemiological information. The expected result is Negative.  Fact Sheet for Patients:  EntrepreneurPulse.com.au  Fact Sheet for Healthcare Providers:  IncredibleEmployment.be  This test is no t yet approved or cleared by the [place] FDA and  has been authorized for detection and/or diagnosis of SARS-CoV-2 by FDA under an Emergency Use Authorization (EUA). This EUA will remain  in effect (meaning this test can be used) for the duration of the COVID-19 declaration under Section 564(b)(1) of the Act, 21 U.S.C.section 360bbb-3(b)(1), unless the authorization is terminated  or revoked sooner.       Influenza A by PCR NEGATIVE NEGATIVE Final   Influenza B by PCR NEGATIVE NEGATIVE Final    Comment: (NOTE) The Xpert Xpress SARS-CoV-2/FLU/RSV plus assay is intended as an aid in the diagnosis of influenza from Nasopharyngeal swab specimens and should not be used as a sole basis for treatment. Nasal washings and aspirates are unacceptable for Xpert Xpress SARS-CoV-2/FLU/RSV testing.  Fact Sheet for Patients: EntrepreneurPulse.com.au  Fact Sheet for Healthcare Providers: IncredibleEmployment.be  This test is not yet approved or cleared by the [place] FDA and has been authorized for detection and/or diagnosis of SARS-CoV-2 by FDA under an Emergency Use  Authorization (EUA).  This EUA will remain in effect (meaning this test can be used) for the duration of the COVID-19 declaration under Section 564(b)(1) of the Act, 21 U.S.C. section 360bbb-3(b)(1), unless the authorization is terminated or revoked.  Performed at [facility] [address], [place], [place] [address]   Urine culture     Status: None   Collection Time: 01/23/20  9:11 PM   Specimen: In/Out Cath Urine  Result Value Ref Range Status   Specimen Description   Final    IN/OUT CATH URINE Performed at [facility], [address], [place], [place] [address]    Special Requests   Final    NONE Performed at [facility], [address] [place], [place] [address]    Culture   Final    NO GROWTH Performed at [facility], [place] [address] [place], [place] [address]    Report Status 01/25/2020 FINAL  Final  Culture, blood (single)     Status: None   Collection Time: 01/23/20 10:05 PM   Specimen: BLOOD  Result Value Ref Range Status   Specimen Description BLOOD BLOOD LEFT FOREARM  Final   Special Requests   Final    BOTTLES DRAWN AEROBIC ONLY Blood Culture results may not be optimal due to an inadequate volume of blood received in culture bottles   Culture   Final    NO GROWTH 5 DAYS Performed at [facility], [address] [place], [place] [address]    Report Status 01/28/2020 FINAL  Final  MRSA PCR Screening     Status: None   Collection Time: 01/24/20  1:00 AM   Specimen: Nasopharyngeal  Result Value Ref Range Status   MRSA by PCR NEGATIVE NEGATIVE Final    Comment:        The GeneXpert MRSA Assay (FDA approved for NASAL specimens only), is one component of a comprehensive MRSA colonization surveillance program. It is not intended to diagnose MRSA infection nor to guide or monitor treatment for MRSA infections. Performed at [facility], [address] [place], [place] [address]   Culture,  respiratory (non-expectorated)     Status: None   Collection Time: 01/24/20 11:58 AM   Specimen: SPU; Respiratory  Result Value Ref Range Status   Specimen Description   Final    SPUTUM Performed at [facility], [address], [place],  [address]    Special Requests   Final    NONE Performed at [facility], [place], [place] [address]    Gram Stain NO WBC SEEN NO ORGANISMS SEEN   Final   Culture   Final    RARE Normal respiratory flora-no Staph aureus or Pseudomonas seen Performed at [facility], [place] [address], [place],  [address]    Report Status 01/26/2020 FINAL  Final         Radiology Studies: No results found.      Scheduled Meds: . allopurinol  100 mg Per NG tube Daily  . busPIRone  30 mg Per NG tube BID  . enoxaparin (LOVENOX) injection  40 mg Subcutaneous Q24H  . escitalopram  30 mg Per Tube Daily  . feeding supplement (PROSource TF)  90 mL Per Tube BID  . feeding supplement (VITAL 1.5 CAL)  1,000 mL Per Tube Q24H  . folic acid  1 mg Per Tube Daily  . gabapentin  600 mg Per Tube Q8H  . mouth rinse  15  mL Mouth Rinse BID  . melatonin  5 mg Per Tube QHS  . multivitamin with minerals  1 tablet Per Tube Daily  . pantoprazole (PROTONIX) IV  40 mg Intravenous QHS  . pravastatin  20 mg Per NG tube q1800  . rOPINIRole  0.5 mg Per Tube QHS  . scopolamine  1 patch Transdermal Q72H   Continuous Infusions: . sodium chloride Stopped (01/24/20 0935)  . [START ON 01/29/2020] cefoTEtan (CEFOTAN) IV    . dextrose 5 % and 0.45 % NaCl with KCl 20 mEq/L       LOS: 5 days    Time spent: 35 minutes    Sharen Hones, MD [facility]   To contact the attending provider between 7A-7P or the covering provider during after hours 7P-7A, please log into the web site [email or web address] and access using universal [place] password for that web site. If you do not have the password, please call the hospital  operator.  01/28/2020, 10:51 AM

## 2020-01-29 ENCOUNTER — Encounter
Admission: EM | Disposition: A | Discharge status: Skilled Nursing Facility | Payer: Medicare Other | Source: Home / Self Care | Attending: Internal Medicine

## 2020-01-29 ENCOUNTER — Inpatient Hospital Stay: Payer: Medicare Other | Admitting: Certified Registered"

## 2020-01-29 ENCOUNTER — Encounter: Payer: Self-pay | Admitting: *Deleted

## 2020-01-29 DIAGNOSIS — Z930 Tracheostomy status: Secondary | ICD-10-CM

## 2020-01-29 DIAGNOSIS — J698 Pneumonitis due to inhalation of other solids and liquids: Secondary | ICD-10-CM

## 2020-01-29 LAB — CBC WITH DIFFERENTIAL/PLATELET
Abs Immature Granulocytes: 0.07 10*3/uL (ref 0.00–0.07)
Basophils Absolute: 0 10*3/uL (ref 0.0–0.1)
Basophils Relative: 0 %
Eosinophils Absolute: 0.1 10*3/uL (ref 0.0–0.5)
Eosinophils Relative: 1 %
HCT: 38.7 % (ref 36.0–46.0)
Hemoglobin: 11.9 g/dL — ABNORMAL LOW (ref 12.0–15.0)
Immature Granulocytes: 1 %
Lymphocytes Relative: 19 %
Lymphs Abs: 2 10*3/uL (ref 0.7–4.0)
MCH: 25.9 pg — ABNORMAL LOW (ref 26.0–34.0)
MCHC: 30.7 g/dL (ref 30.0–36.0)
MCV: 84.1 fL (ref 80.0–100.0)
Monocytes Absolute: 0.6 10*3/uL (ref 0.1–1.0)
Monocytes Relative: 6 %
Neutro Abs: 7.5 10*3/uL (ref 1.7–7.7)
Neutrophils Relative %: 73 %
Platelets: 211 10*3/uL (ref 150–400)
RBC: 4.6 MIL/uL (ref 3.87–5.11)
RDW: 13.6 % (ref 11.5–15.5)
Smear Review: NORMAL
WBC: 10.3 10*3/uL (ref 4.0–10.5)
nRBC: 0 % (ref 0.0–0.2)

## 2020-01-29 LAB — GLUCOSE, CAPILLARY
Glucose-Capillary: 158 mg/dL — ABNORMAL HIGH (ref 70–99)
Glucose-Capillary: 135 mg/dL — ABNORMAL HIGH (ref 70–99)
Glucose-Capillary: 140 mg/dL — ABNORMAL HIGH (ref 70–99)
Glucose-Capillary: 160 mg/dL — ABNORMAL HIGH (ref 70–99)
Glucose-Capillary: 216 mg/dL — ABNORMAL HIGH (ref 70–99)
Glucose-Capillary: 220 mg/dL — ABNORMAL HIGH (ref 70–99)

## 2020-01-29 LAB — BASIC METABOLIC PANEL
Anion gap: 13 (ref 5–15)
BUN: 18 mg/dL (ref 8–23)
CO2: 24 mmol/L (ref 22–32)
Calcium: 8.7 mg/dL — ABNORMAL LOW (ref 8.9–10.3)
Chloride: 102 mmol/L (ref 98–111)
Creatinine, Ser: 0.94 mg/dL (ref 0.44–1.00)
GFR, Estimated: >60 mL/min (ref >60)
Glucose, Bld: 167 mg/dL — ABNORMAL HIGH (ref 70–99)
Potassium: 4 mmol/L (ref 3.5–5.1)
Sodium: 139 mmol/L (ref 135–145)

## 2020-01-29 LAB — PHOSPHORUS: Phosphorus: 1.9 mg/dL — ABNORMAL LOW (ref 2.5–4.6)

## 2020-01-29 LAB — MAGNESIUM: Magnesium: 2 mg/dL (ref 1.7–2.4)

## 2020-01-29 SURGERY — INSERTION, JEJUNOSTOMY TUBE, ROBOT-ASSISTED
Anesthesia: General

## 2020-01-29 MED ORDER — FENTANYL CITRATE (PF) 100 MCG/2ML IJ SOLN
INTRAMUSCULAR | Status: DC | PRN
  Administered 2020-01-29 (×3): 50 ug via INTRAVENOUS

## 2020-01-29 MED ORDER — ESMOLOL HCL 100 MG/10ML IV SOLN
INTRAVENOUS | Status: DC | PRN
  Administered 2020-01-29 (×4): 10 mg via INTRAVENOUS

## 2020-01-29 MED ORDER — OSMOLITE 1.5 CAL PO LIQD
1000.0000 mL | ORAL | Status: DC
  Administered 2020-01-30 – 2020-02-02 (×3): 1000 mL

## 2020-01-29 MED ORDER — SUGAMMADEX SODIUM 200 MG/2ML IV SOLN
INTRAVENOUS | Status: DC | PRN
  Administered 2020-01-29: 200 mg via INTRAVENOUS

## 2020-01-29 MED ORDER — ARTIFICIAL TEARS OPHTHALMIC OINT
TOPICAL_OINTMENT | OPHTHALMIC | Status: DC | PRN
  Administered 2020-01-29: 1 via OPHTHALMIC

## 2020-01-29 MED ORDER — LIDOCAINE HCL (PF) 2 % IJ SOLN
INTRAMUSCULAR | Status: AC
  Filled 2020-01-29: qty 5

## 2020-01-29 MED ORDER — PROSOURCE TF PO LIQD
45.0000 mL | Freq: Every day | ORAL | Status: DC
  Administered 2020-01-30 – 2020-02-01 (×3): 45 mL
  Filled 2020-01-29 (×4): qty 45

## 2020-01-29 MED ORDER — ACETAMINOPHEN 10 MG/ML IV SOLN
INTRAVENOUS | Status: DC | PRN
  Administered 2020-01-29: 1000 mg via INTRAVENOUS

## 2020-01-29 MED ORDER — LABETALOL HCL 5 MG/ML IV SOLN
INTRAVENOUS | Status: DC | PRN
  Administered 2020-01-29: 5 mg via INTRAVENOUS

## 2020-01-29 MED ORDER — ACETAMINOPHEN 10 MG/ML IV SOLN
INTRAVENOUS | Status: AC
  Filled 2020-01-29: qty 100

## 2020-01-29 MED ORDER — ONDANSETRON HCL 4 MG/2ML IJ SOLN: 4.0000 mg | Freq: Once | INTRAMUSCULAR | Status: DC | PRN

## 2020-01-29 MED ORDER — SUCCINYLCHOLINE CHLORIDE 200 MG/10ML IV SOSY
PREFILLED_SYRINGE | INTRAVENOUS | Status: AC
  Filled 2020-01-29: qty 10

## 2020-01-29 MED ORDER — LABETALOL HCL 5 MG/ML IV SOLN
INTRAVENOUS | Status: AC
  Filled 2020-01-29: qty 4

## 2020-01-29 MED ORDER — ACETAMINOPHEN 10 MG/ML IV SOLN
1000.0000 mg | Freq: Four times a day (QID) | INTRAVENOUS | Status: AC
  Administered 2020-01-29 – 2020-01-30 (×4): 1000 mg via INTRAVENOUS
  Filled 2020-01-29 (×4): qty 100

## 2020-01-29 MED ORDER — DEXAMETHASONE SODIUM PHOSPHATE 10 MG/ML IJ SOLN
INTRAMUSCULAR | Status: AC
  Filled 2020-01-29: qty 1

## 2020-01-29 MED ORDER — GLYCOPYRROLATE 0.2 MG/ML IJ SOLN
INTRAMUSCULAR | Status: DC | PRN
  Administered 2020-01-29: .2 mg via INTRAVENOUS

## 2020-01-29 MED ORDER — WHITE PETROLATUM EX OINT
TOPICAL_OINTMENT | CUTANEOUS | Status: AC
  Filled 2020-01-29: qty 5

## 2020-01-29 MED ORDER — ESMOLOL HCL 100 MG/10ML IV SOLN
INTRAVENOUS | Status: AC
  Filled 2020-01-29: qty 10

## 2020-01-29 MED ORDER — EPHEDRINE SULFATE 50 MG/ML IJ SOLN
INTRAMUSCULAR | Status: DC | PRN
  Administered 2020-01-29: 5 mg via INTRAVENOUS

## 2020-01-29 MED ORDER — ROCURONIUM BROMIDE 100 MG/10ML IV SOLN
INTRAVENOUS | Status: DC | PRN
  Administered 2020-01-29: 10 mg via INTRAVENOUS
  Administered 2020-01-29: 40 mg via INTRAVENOUS

## 2020-01-29 MED ORDER — ROCURONIUM BROMIDE 10 MG/ML (PF) SYRINGE
PREFILLED_SYRINGE | INTRAVENOUS | Status: AC
  Filled 2020-01-29: qty 10

## 2020-01-29 MED ORDER — ONDANSETRON HCL 4 MG/2ML IJ SOLN
INTRAMUSCULAR | Status: AC
  Filled 2020-01-29: qty 4

## 2020-01-29 MED ORDER — PROPOFOL 10 MG/ML IV BOLUS
INTRAVENOUS | Status: DC | PRN
  Administered 2020-01-29: 90 mg via INTRAVENOUS

## 2020-01-29 MED ORDER — ONDANSETRON HCL 4 MG/2ML IJ SOLN
INTRAMUSCULAR | Status: DC | PRN
  Administered 2020-01-29 (×2): 4 mg via INTRAVENOUS

## 2020-01-29 MED ORDER — BUPIVACAINE-EPINEPHRINE 0.25% -1:200000 IJ SOLN
INTRAMUSCULAR | Status: DC | PRN
  Administered 2020-01-29: 30 mL

## 2020-01-29 MED ORDER — LIDOCAINE HCL (CARDIAC) PF 100 MG/5ML IV SOSY
PREFILLED_SYRINGE | INTRAVENOUS | Status: DC | PRN
  Administered 2020-01-29: 100 mg via INTRAVENOUS

## 2020-01-29 MED ORDER — DEXAMETHASONE SODIUM PHOSPHATE 10 MG/ML IJ SOLN
INTRAMUSCULAR | Status: DC | PRN
  Administered 2020-01-29: 10 mg via INTRAVENOUS

## 2020-01-29 MED ORDER — KETOROLAC TROMETHAMINE 15 MG/ML IJ SOLN
15.0000 mg | Freq: Four times a day (QID) | INTRAMUSCULAR | Status: DC
  Administered 2020-01-29 – 2020-01-31 (×6): 15 mg via INTRAVENOUS
  Filled 2020-01-29 (×6): qty 1

## 2020-01-29 MED ORDER — FENTANYL CITRATE (PF) 100 MCG/2ML IJ SOLN
INTRAMUSCULAR | Status: AC
  Filled 2020-01-29: qty 2

## 2020-01-29 MED ORDER — BUPIVACAINE LIPOSOME 1.3 % IJ SUSP
INTRAMUSCULAR | Status: DC | PRN
  Administered 2020-01-29: 20 mL

## 2020-01-29 MED ORDER — FREE WATER: 100.0000 mL | Status: DC

## 2020-01-29 MED ORDER — PHENYLEPHRINE HCL (PRESSORS) 10 MG/ML IV SOLN
INTRAVENOUS | Status: DC | PRN
  Administered 2020-01-29 (×3): 100 ug via INTRAVENOUS

## 2020-01-29 MED ORDER — GLYCOPYRROLATE 0.2 MG/ML IJ SOLN
INTRAMUSCULAR | Status: AC
  Filled 2020-01-29: qty 1

## 2020-01-29 MED ORDER — FENTANYL CITRATE (PF) 100 MCG/2ML IJ SOLN: 25.0000 ug | INTRAMUSCULAR | Status: DC | PRN

## 2020-01-29 SURGICAL SUPPLY — 68 items
CANISTER SUCT 1200ML W/VALVE (MISCELLANEOUS) ×2 IMPLANT
CANNULA REDUC XI 12-8 STAPL (CANNULA) ×1
CANNULA REDUCER 12-8 DVNC XI (CANNULA) ×1 IMPLANT
CHLORAPREP W/TINT 26 (MISCELLANEOUS) ×2 IMPLANT
COVER LIGHT HANDLE STERIS (MISCELLANEOUS) ×2 IMPLANT
COVER TIP SHEARS 8 DVNC (MISCELLANEOUS) ×1 IMPLANT
COVER TIP SHEARS 8MM DA VINCI (MISCELLANEOUS) ×1
COVER WAND RF STERILE (DRAPES) ×4 IMPLANT
DEFOGGER SCOPE WARMER CLEARIFY (MISCELLANEOUS) ×2 IMPLANT
DERMABOND ADVANCED (GAUZE/BANDAGES/DRESSINGS) ×1
DERMABOND ADVANCED .7 DNX12 (GAUZE/BANDAGES/DRESSINGS) ×1 IMPLANT
DRAPE ARM DVNC X/XI (DISPOSABLE) ×3 IMPLANT
DRAPE COLUMN DVNC XI (DISPOSABLE) ×1 IMPLANT
DRAPE DA VINCI XI ARM (DISPOSABLE) ×3
DRAPE DA VINCI XI COLUMN (DISPOSABLE) ×1
ELECT CAUTERY BLADE 6.4 (BLADE) ×2 IMPLANT
ELECT REM PT RETURN 9FT ADLT (ELECTROSURGICAL) ×2
ELECTRODE REM PT RTRN 9FT ADLT (ELECTROSURGICAL) ×1 IMPLANT
GAUZE SPONGE 4X4 12PLY STRL (GAUZE/BANDAGES/DRESSINGS) ×2 IMPLANT
GLOVE BIO SURGEON STRL SZ7 (GLOVE) ×4 IMPLANT
GOWN STRL REUS W/ TWL LRG LVL3 (GOWN DISPOSABLE) ×3 IMPLANT
GOWN STRL REUS W/TWL LRG LVL3 (GOWN DISPOSABLE) ×3
IRRIGATION STRYKERFLOW (MISCELLANEOUS) IMPLANT
IRRIGATOR STRYKERFLOW (MISCELLANEOUS)
KIT PINK PAD W/HEAD ARE REST (MISCELLANEOUS) ×2
KIT PINK PAD W/HEAD ARM REST (MISCELLANEOUS) ×1 IMPLANT
LABEL OR SOLS (LABEL) ×2 IMPLANT
MANIFOLD NEPTUNE II (INSTRUMENTS) ×2 IMPLANT
NEEDLE HYPO 22GX1.5 SAFETY (NEEDLE) ×2 IMPLANT
NEEDLE INSUFFLATION 14GA 120MM (NEEDLE) ×2 IMPLANT
OBTURATOR OPTICAL STANDARD 8MM (TROCAR) ×1
OBTURATOR OPTICAL STND 8 DVNC (TROCAR) ×1
OBTURATOR OPTICALSTD 8 DVNC (TROCAR) ×1 IMPLANT
PACK LAP CHOLECYSTECTOMY (MISCELLANEOUS) ×2 IMPLANT
PENCIL ELECTRO HAND CTR (MISCELLANEOUS) ×2 IMPLANT
SCISSORS METZENBAUM CVD 33 (INSTRUMENTS) ×2 IMPLANT
SEAL CANN UNIV 5-8 DVNC XI (MISCELLANEOUS) ×3 IMPLANT
SEAL XI 5MM-8MM UNIVERSAL (MISCELLANEOUS) ×3
SEALER VESSEL DA VINCI XI (MISCELLANEOUS)
SEALER VESSEL EXT DVNC XI (MISCELLANEOUS) IMPLANT
SET TUBE SMOKE EVAC HIGH FLOW (TUBING) ×2 IMPLANT
SOLUTION ELECTROLUBE (MISCELLANEOUS) ×2 IMPLANT
SPONGE DRAIN TRACH 4X4 STRL 2S (GAUZE/BANDAGES/DRESSINGS) ×2 IMPLANT
SPONGE LAP 4X18 RFD (DISPOSABLE) ×2 IMPLANT
STAPLER CANNULA SEAL DVNC XI (STAPLE) ×1 IMPLANT
STAPLER CANNULA SEAL XI (STAPLE) ×1
SUT ETHILON 3-0 FS-10 30 BLK (SUTURE)
SUT ETHILON 4 0 P 3 18 (SUTURE) ×2 IMPLANT
SUT MNCRL 4-0 (SUTURE) ×1
SUT MNCRL 4-0 27XMFL (SUTURE) ×1
SUT V-LOC 90 ABS 3-0 VLT  V-20 (SUTURE) ×1
SUT V-LOC 90 ABS 3-0 VLT V-20 (SUTURE) ×1 IMPLANT
SUT VICRYL 0 AB UR-6 (SUTURE) IMPLANT
SUT VLOC 180 2-0 9IN GS21 (SUTURE) IMPLANT
SUT VLOC 90 S/L VL9 GS22 (SUTURE) ×4 IMPLANT
SUTURE EHLN 3-0 FS-10 30 BLK (SUTURE) IMPLANT
SUTURE MNCRL 4-0 27XMF (SUTURE) ×1 IMPLANT
SYR 20ML LL LF (SYRINGE) ×2 IMPLANT
SYR 30ML LL (SYRINGE) ×2 IMPLANT
SYR TOOMEY IRRIG 70ML (MISCELLANEOUS) ×2
SYRINGE TOOMEY IRRIG 70ML (MISCELLANEOUS) ×1 IMPLANT
TAPE TRANSPORE STRL 2 31045 (GAUZE/BANDAGES/DRESSINGS) ×2 IMPLANT
TROCAR BALLN GELPORT 12X130M (ENDOMECHANICALS) ×2 IMPLANT
TROCAR XCEL NON-BLD 5MMX100MML (ENDOMECHANICALS) ×2 IMPLANT
TUBE GASTRO 14F 5C (TUBING) IMPLANT
TUBE JEJUNO 16X14 (TUBING) ×2 IMPLANT
TUBE JEJUNO 18X14 (TUBING) IMPLANT
TUBE JEJUNO 22X14 (TUBING) IMPLANT

## 2020-01-29 NOTE — Anesthesia Postprocedure Evaluation (Signed)
Anesthesia Post Note  Patient: Angela David  Procedure(s) Performed: XI ROBOTIC ASSISTED G-TUBE PLACEMENT (N/A )  Patient location during evaluation: PACU Anesthesia Type: General Level of consciousness: awake and alert Pain management: pain level controlled Vital Signs Assessment: post-procedure vital signs reviewed and stable Respiratory status: spontaneous breathing and respiratory function stable Cardiovascular status: stable Anesthetic complications: no   No complications documented.   Last Vitals:  Vitals:   01/29/20 1323 01/29/20 1345  BP: 118/71 110/87  Pulse: (!) 112 (!) 110  Resp: (!) 28 20  Temp: 36.7 C 36.7 C  SpO2: 95% 96%    Last Pain:  Vitals:   01/29/20 1345  TempSrc: Oral  PainSc:                  [name]

## 2020-01-29 NOTE — Progress Notes (Signed)
Called by RN, trach found removed. Lurline Idol was replaced when I arrived, pt Spo2 upper 90's. Catheter passed with some difficulty, inner cannula changed, suctioned pt for small amount of creamy yellow thick secretions. ETCO2 placed on trach, positive color change, equal bilateral breath sounds, drainage gauze replaced. Humidity bottle changed and SPO2 100% on 28% ATC.

## 2020-01-29 NOTE — Transfer of Care (Signed)
Immediate Anesthesia Transfer of Care Note  Patient: Angela David  Procedure(s) Performed: XI ROBOTIC ASSISTED G-TUBE PLACEMENT (N/A )  Patient Location: PACU  Anesthesia Type:General  Level of Consciousness: awake, drowsy and patient cooperative  Airway & Oxygen Therapy: Patient Spontanous Breathing and Patient connected to tracheostomy mask oxygen  Post-op Assessment: Report given to RN and Post -op Vital signs reviewed and stable  Post vital signs: Reviewed and stable  Last Vitals:  Vitals Value Taken Time  BP 126/92 01/29/20 1239  Temp    Pulse 110 01/29/20 1243  Resp 20 01/29/20 1243  SpO2 100 % 01/29/20 1243  Vitals shown include unvalidated device data.  Last Pain:  Vitals:   01/29/20 0853  TempSrc: Temporal  PainSc: 2       Patients Stated Pain Goal: 0 (10/30/99 4996)  Complications: No complications documented.

## 2020-01-29 NOTE — Progress Notes (Signed)
Pt found to have oxygen mask laying on floor, and trach completely out. Pt's trach was replaced, however pt still having SOB. Rapid response called. Pt stable.

## 2020-01-29 NOTE — Anesthesia Preprocedure Evaluation (Addendum)
Anesthesia Evaluation  Patient identified by MRN, date of birth, ID band Patient awake and Patient confused    Reviewed: Allergy & Precautions, NPO status , Patient's Chart, lab work & pertinent test results  History of Anesthesia Complications Negative for: history of anesthetic complications  Airway Mallampati: Trach       Dental   Pulmonary pneumonia (aspiration),           Cardiovascular hypertension, Pt. on medications (-) Past MI and (-) CHF (-) dysrhythmias (-) Valvular Problems/Murmurs     Neuro/Psych neg Seizures Anxiety Depression    GI/Hepatic Neg liver ROS, GERD  Medicated and Controlled,  Endo/Other  diabetes, Type 2, Oral Hypoglycemic Agents  Renal/GU negative Renal ROS     Musculoskeletal   Abdominal   Peds  Hematology   Anesthesia Other Findings   Reproductive/Obstetrics                            Anesthesia Physical Anesthesia Plan  ASA: III  Anesthesia Plan: General   Post-op Pain Management:    Induction: Intravenous  PONV Risk Score and Plan: 3 and Ondansetron, Dexamethasone and Treatment may vary due to age or medical condition  Airway Management Planned: Tracheostomy  Additional Equipment:   Intra-op Plan:   Post-operative Plan:   Informed Consent: I have reviewed the patients History and Physical, chart, labs and discussed the procedure including the risks, benefits and alternatives for the proposed anesthesia with the patient or authorized representative who has indicated his/her understanding and acceptance.       Plan Discussed with:   Anesthesia Plan Comments:         Anesthesia Quick Evaluation

## 2020-01-29 NOTE — Op Note (Signed)
Robotic assisted laparoscopic gastrostomy tube   Pre-operative Diagnosis: malnutrition, resp failure   Post-operative Diagnosis: same   Procedure:  Robotic assisted laparoscopic G tube 16 FR with lysis of adhesiions   Surgeon: Caroleen Hamman, MD FACS   Anesthesia: Gen. with endotracheal tube   Findings: Adhesions from omentum to abdominal wall 4 cms ventral hernia G tube intraluminally   Estimated Blood Loss:5 cc             Complications: none     Procedure Details  The patient was seen again in the Holding Room. The benefits, complications, treatment options, and expected outcomes were discussed with the Family. The risks of bleeding, infection, recurrence of symptoms, failure to resolve symptoms, bowel injury, any of which could require further surgery were reviewed .   The  family concurred with the proposed plan, giving informed consent.  The patient was taken to Operating Room, identified  and the procedure verified. A Time Out was held and the above information confirmed.   Prior to the induction of general anesthesia, antibiotic prophylaxis was administered. VTE prophylaxis was in place. General endotracheal anesthesia was then administered and tolerated well. After the induction, the abdomen was prepped with Chloraprep and draped in the sterile fashion. The patient was positioned in the supine position. I started with veres needle over Palmer's point, The drop test was not adequate . I stopped and performed a optiview approach visualized the anterior and posterior fascial layers.   Pneumoperitoneum was then created with CO2 and tolerated well without any adverse changes in the patient's vital signs.  Two 8-mm ports were placed under direct vision.  Inspection revealed ventral hernia. Laparoscopic LOA performed with scissors, no evidence of bowel injuries visualized. I grasped the body  stomach and tented toward the abdominal wall, small left subcostal incision created to placed  Gastrostomy tube under direct visualization.     The patient was positioned  in reverse Trendelenburg, robot was brought to the surgical field and docked in the standard fashion.  We made sure all the instrumentation was kept indirect view at all times and that there were no collision between the arms. I scrubbed out and went to the console. Using the scissors I performed a gastrotomy and confirmed that I was intraluminally. A partial pursestring suture was placed using 3 0 V-Loc suture around the G-tube in an inner fashion. I placed the G-tube through the gastrotomy in direct fashion.  I was able to get my scrub to flush saline via the G-tube confirming the proper position of the tube intraluminally.  The balloon was inflated and pulled back.  We then were able to tied the first pursestring in the standard fashion and completing our circumferential outer pursestring with the 2 V-Loc sutures Inspection of the  upper quadrant was performed. No bleeding, bile duct injury or leak, or bowel injury was noted.  All the needles were removed under direct visualization. Robotic instruments and robotic arms were undocked in the standard fashion.  I scrubbed back in. Liposomal Marcaine was used to infiltrate the abdominal wall at all incision sites. I decided against repair of ventral hernia due to malnutrition and lack of medical optimization. Pneumoperitoneum was released.  The periumbilical port site was closed with interrumpted 0 Vicryl sutures. 4-0 subcuticular Monocryl was used to close the skin. Dermabond was  applied.  The patient was then extubated and brought to the recovery room in stable condition. Sponge, lap, and needle counts were correct at closure and at  the conclusion of the case.               Caroleen Hamman, MD, FACS

## 2020-01-29 NOTE — TOC Initial Note (Signed)
Transition of Care [facility]) - Initial/Assessment Note    Patient Details  Name: Angela David MRN: [id number] Date of Birth: [birth year]  Transition of Care [facility]) CM/SW Contact:    Candie Chroman, LCSW Phone Number: 01/29/2020, 5:03 PM  Clinical Narrative:  CSW met with patient. Husband at bedside. CSW introduced role and explained that PT recommendations would be discussed. They are agreeable to SNF placement. [facility] was first preference but CSW explained they only take patients that already live there. CSW explained potential of needing to go out of [facility] due to trach. No further concerns. CSW encouraged patient and her husband to contact CSW as needed. CSW will continue to follow patient and her husband for support and facilitate discharge to SNF once medically stable.              Expected Discharge Plan: Skilled Nursing Facility Barriers to Discharge: Continued Medical Work up   Patient Goals and CMS Choice        Expected Discharge Plan and Services Expected Discharge Plan: [place] Acute Care Choice: [place] Living arrangements for the past 2 months: Single Family Home                                      Prior Living Arrangements/Services Living arrangements for the past 2 months: Single Family Home Lives with:: Spouse Patient language and need for interpreter reviewed:: Yes Do you feel safe going back to the place where you live?: Yes      Need for Family Participation in Patient Care: Yes (Comment) Care giver support system in place?: Yes (comment)   Criminal Activity/Legal Involvement Pertinent to Current Situation/Hospitalization: No - Comment as needed  Activities of Daily Living Home Assistive Devices/Equipment: Eyeglasses, CBG Meter ADL Screening (condition at time of admission) Patient's cognitive ability adequate to safely complete daily activities?: Yes Is the patient deaf or have  difficulty hearing?: No Does the patient have difficulty seeing, even when wearing glasses/contacts?: No Does the patient have difficulty concentrating, remembering, or making decisions?: No Patient able to express need for assistance with ADLs?: Yes Does the patient have difficulty dressing or bathing?: No Independently performs ADLs?: Yes (appropriate for developmental age) Does the patient have difficulty walking or climbing stairs?: No Weakness of Legs: Both Weakness of Arms/Hands: None  Permission Sought/Granted Permission sought to share information with : Facility Sport and exercise psychologist, Family Supports    Share Information with NAME: Maegen Wigle  Permission granted to share info w AGENCY: SNF's  Permission granted to share info w Relationship: Spouse  Permission granted to share info w Contact Information: [phone]  Emotional Assessment Appearance:: Appears stated age Attitude/Demeanor/Rapport: Engaged, Gracious Affect (typically observed): Accepting, Appropriate, Calm, Pleasant Orientation: : Oriented to Self, Oriented to Place, Oriented to  Time, Oriented to Situation Alcohol / Substance Use: Not Applicable Psych Involvement: No (comment)  Admission diagnosis:  Acute respiratory failure (HCC) [J96.00] Hypercapnia [R06.89] Altered mental status, unspecified altered mental status type [R41.82] Patient Active Problem List   Diagnosis Date Noted  . Aspiration pneumonia of both lower lobes due to gastric secretions ([place]) 01/28/2020  . Acute metabolic encephalopathy 49/20/1007  . Acute respiratory failure ([place]) 01/23/2020  . Left lower lobe pneumonia 01/16/2020  . 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 Drug-induced polyneuropathy ([place]) 05/05/2019  . 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[place]) 05/05/2019  . Weakness   .  Hypoxia   . Acute respiratory failure with hypoxia and hypercapnia ([place]) 03/21/2019  . Hypotension 03/21/2019  . Acute encephalopathy  03/21/2019  . Multiple fractures of ribs, right side, initial encounter for closed fracture 12/25/2018  . Colon polyp 11/06/2018  . Esophageal dysphagia   . 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 Stricture and stenosis of esophagus   . Elevated CEA 09/02/2018  . Goals of care, counseling/discussion 09/02/2018  . Polyp of transverse colon 09/02/2018  . Foreign body in right foot 05/26/2018  . History of tracheostomy 04/30/2018  . Moderate episode of recurrent major depressive disorder ([place]) 04/03/2018  . Poor balance 04/03/2018  . Shoulder lesion, right 02/28/2017  . Cervical radiculopathy 09/28/2016  . Peripheral neuropathy 08/28/2016  . Diabetic peripheral neuropathy ([place]) 08/28/2016  . Leg weakness, bilateral 08/13/2016  . Gait abnormality 08/13/2016  . Carcinoma of overlapping sites of right breast in female, estrogen receptor negative ([place]) 12/27/2015  . Malignant neoplasm of breast ([place]) 03/28/2015  . 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 Hypercholesterolemia 03/28/2015  . Recurrent major depressive disorder, in partial remission ([place]) 03/01/2015  . 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 Cephalalgia 09/15/2014  . Headache due to trauma 09/15/2014  . Osteopenia 01/07/2014  . Age-related osteoporosis without current pathological fracture 01/07/2014  . History of colonic polyps 12/14/2013  . History of breast cancer 11/26/2012  . H/O malignant neoplasm of breast 11/26/2012  . 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 Tracheostomy present ([place]) 12/06/2011  . Calcification of bronchus or trachea 01/16/2011  . 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 Anxiety state 12/19/2010  . Blood in sputum 12/19/2010  . HLD (hyperlipidemia) 12/19/2010  . Type 2 diabetes mellitus ([place]) 12/19/2010   PCP:  Iva Boop, MD Pharmacy:   [facility], [address] [place] [address] Phone: [phone] Fax:  [phone]  [facility] [place], [place] - [address] [place] [address] Phone: [phone] Fax: [phone]     Social Determinants of Health (SDOH) Interventions    Readmission Risk Interventions No flowsheet data found.

## 2020-01-29 NOTE — Plan of Care (Signed)
Continuing with plan of care. 

## 2020-01-29 NOTE — Progress Notes (Signed)
PROGRESS NOTE    Angela David  JOA:[id number] DOB: 04-05-1940 DOA: 01/23/2020 PCP: Iva Boop, MD   Chief complaint.  Shortness of breath Brief Narrative:  44 yofemale presentedto the emergency department from [facility] of family concerns regarding worsening altered mental status.Upon arrival to the ED patient was unable to give any history due to lethargy.She hada recent admission at [facility] for altered mental status and suspected aspiration pneumonia.The patient was discharged 2 days ago on 01/21/2020 and family reported via ED documentation that the patient has undergone a drastic mental status change.ED work-up included aCT headwhich was negative for any acute abnormality.ABG revealed respiratory acidosis with hypoxia and hypercarbia and lab work showed leukocytosis. Initial lactic acid was unimpressive at 1.0,repeat 1.9,cultures and procalcitonin are pending.Chest x-ray shows no significant interval change, withhazy lung markings and unchanged potentialopacityvssmall left lower lobe pleural effusion. Patient has a chronic tracheostomy, she was placed on mechanical ventilation on 11/27. She is also fed with a Dobbhoff. She is off the ventilator on 12/1. Currently on oxygen via trach collar.   Assessment & Plan:   Active Problems:   Acute respiratory failure (HCC)   Aspiration pneumonia of both lower lobes due to gastric secretions (HCC)   Acute metabolic encephalopathy  #1. Acute on chronic hypoxemic respiratory failure Status post tracheostomy. Bilateral lower lobe aspiration pneumonia. Patient condition improving.  She is still on 4 L oxygen over trach collar.  She was not on oxygen at home. Continue antibiotics with Cefotan. Patient is in surgery for PEG tube.  #2.  Chronic diastolic congestive heart failure. No evidence of volume overload.  Due to patient n.p.o. status, he is on gentle rehydration.  3.  Acute metabolic  encephalopathy. Condition improved.  Patient has no confusion today.  4.  Paroxysmal atrial fibrillation. Not chronically on anticoagulation.    DVT prophylaxis: Lovenox Code Status: Partial code Family Communication: husband updated.  Disposition Plan:  .   Status is: Inpatient  Remains inpatient appropriate because:Inpatient level of care appropriate due to severity of illness   Dispo: The patient is from: Home              Anticipated d/c is to: Home              Anticipated d/c date is: 3 days              Patient currently is not medically stable to d/c.        I/O last 3 completed shifts: In: 430.5 [I.V.:430.5] Out: 560 [Urine:560] Total I/O In: -  Out: 1 [Urine:1]     Consultants:   GS  Procedures: PEG tube  Antimicrobials:  Cefotan  Subjective: Able to communicate with patient by writing on a board.  She does not have any signal short of breath today.  She has a cough, has not been requiring frequent suctions. No fever or chills. She is n.p.o. pending surgery.  She does not have any abdominal pain nausea vomiting. She has no chest pain or palpitation. No dysuria hematuria  Objective: Vitals:   01/28/20 2343 01/29/20 0419 01/29/20 0823 01/29/20 0853  BP: 127/72 (!) 118/52 104/73 120/76  Pulse: (!) 108 (!) 109 (!) 103 99  Resp: 16 16 16 16   Temp: 98.3 F (36.8 C) 97.7 F (36.5 C)  99 F (37.2 C)  TempSrc: Oral Oral Oral Temporal  SpO2: 99% 100% 100% 99%  Weight:        Intake/Output Summary (Last 24 hours) at 01/29/2020 1053  Last data filed at 01/29/2020 0834 Gross per 24 hour  Intake 430.54 ml  Output 161 ml  Net 269.54 ml   Filed Weights   01/24/20 0407  Weight: 74.6 kg    Examination:  General exam: Appears calm and comfortable  Respiratory system: Clear to auscultation. Respiratory effort normal. Cardiovascular system: Irregular no JVD, murmurs, rubs, gallops or clicks. No pedal edema. Gastrointestinal system: Abdomen is  nondistended, soft and nontender. No organomegaly or masses felt. Normal bowel sounds heard. Central nervous system: Alert and oriented. No focal neurological deficits. Extremities: Symmetric . Skin: No rashes, lesions or ulcers Psychiatry:  Mood & affect appropriate.     Data Reviewed: I have personally reviewed following labs and imaging studies  CBC: Recent Labs  Lab 01/23/20 2027 01/24/20 0230 01/25/20 0640 01/26/20 0525 01/27/20 0614 01/28/20 0500 01/29/20 0408  WBC 12.9*   < > 7.0 9.0 9.1 10.8* 10.3  NEUTROABS 10.9*  --   --   --  6.9 8.6* 7.5  HGB 13.4   < > 9.6* 11.0* 11.7* 13.1 11.9*  HCT 42.3   < > 31.0* 35.8* 37.7 41.5 38.7  MCV 82.1   < > 85.2 85.0 83.8 84.0 84.1  PLT 277   < > 147* 147* 170 202 211   < > = values in this interval not displayed.   Basic Metabolic Panel: Recent Labs  Lab 01/25/20 0640 01/25/20 1722 01/26/20 0525 01/26/20 1726 01/27/20 0614 01/28/20 0500 01/29/20 0408  NA 140  --  143  --  139 138 139  K 3.7  --  4.9  --  4.2 3.9 4.0  CL 108  --  107  --  100 99 102  CO2 25  --  28  --  29 27 24   GLUCOSE 106*  --  184*  --  151* 162* 167*  BUN 10  --  8  --  7* 11 18  CREATININE 0.84  --  0.71  --  0.81 0.76 0.94  CALCIUM 8.0*  --  9.0  --  9.3 9.4 8.7*  MG 2.5*   < > 2.1 1.9 1.9 2.1 2.0  PHOS 2.0*   < > 3.0 2.5 2.8 3.1 1.9*   < > = values in this interval not displayed.   GFR: Estimated Creatinine Clearance: 49.6 mL/min (by C-G formula based on SCr of 0.94 mg/dL). Liver Function Tests: Recent Labs  Lab 01/23/20 2027  AST 22  ALT 25  ALKPHOS 69  BILITOT 0.6  PROT 8.0  ALBUMIN 4.0   No results for input(s): LIPASE, AMYLASE in the last 168 hours. Recent Labs  Lab 01/24/20 0230  AMMONIA 19   Coagulation Profile: Recent Labs  Lab 01/23/20 2027  INR 1.1   Cardiac Enzymes: No results for input(s): CKTOTAL, CKMB, CKMBINDEX, TROPONINI in the last 168 hours. BNP (last 3 results) No results for input(s): PROBNP in the last  8760 hours. HbA1C: No results for input(s): HGBA1C in the last 72 hours. CBG: Recent Labs  Lab 01/28/20 2005 01/28/20 2342 01/29/20 0413 01/29/20 0736 01/29/20 0818  GLUCAP 130* 108* 158* 135* 140*   Lipid Profile: No results for input(s): CHOL, HDL, LDLCALC, TRIG, CHOLHDL, LDLDIRECT in the last 72 hours. Thyroid Function Tests: No results for input(s): TSH, T4TOTAL, FREET4, T3FREE, THYROIDAB in the last 72 hours. Anemia Panel: No results for input(s): VITAMINB12, FOLATE, FERRITIN, TIBC, IRON, RETICCTPCT in the last 72 hours. Sepsis Labs: Recent Labs  Lab 01/23/20 2027 01/23/20 2205  01/24/20 0120 01/24/20 0230 01/25/20 0640  PROCALCITON <0.10  --  <0.10  --  <0.10  LATICACIDVEN 1.0 1.9 7.2* 2.4*  --     Recent Results (from the past 240 hour(s))  Blood culture (routine single)     Status: None   Collection Time: 01/23/20  8:27 PM   Specimen: BLOOD  Result Value Ref Range Status   Specimen Description BLOOD BLOOD LEFT HAND  Final   Special Requests   Final    BOTTLES DRAWN AEROBIC AND ANAEROBIC Blood Culture adequate volume   Culture   Final    NO GROWTH 5 DAYS Performed at [facility], [address] [place], [place] [address]    Report Status 01/28/2020 FINAL  Final  Resp Panel by RT-PCR (Flu A&B, Covid) Nasopharyngeal Swab     Status: None   Collection Time: 01/23/20  8:27 PM   Specimen: Nasopharyngeal Swab; Nasopharyngeal(NP) swabs in vial transport medium  Result Value Ref Range Status   SARS Coronavirus 2 by RT PCR NEGATIVE NEGATIVE Final    Comment: (NOTE) SARS-CoV-2 target nucleic acids are NOT DETECTED.  The SARS-CoV-2 RNA is generally detectable in upper respiratory specimens during the acute phase of infection. The lowest concentration of SARS-CoV-2 viral copies this assay can detect is 138 copies/mL. A negative result does not preclude SARS-Cov-2 infection and should not be used as the sole basis for treatment or other patient management  decisions. A negative result may occur with  improper specimen collection/handling, submission of specimen other than nasopharyngeal swab, presence of viral mutation(s) within the areas targeted by this assay, and inadequate number of viral copies(<138 copies/mL). A negative result must be combined with clinical observations, patient history, and epidemiological information. The expected result is Negative.  Fact Sheet for Patients:  EntrepreneurPulse.com.au  Fact Sheet for Healthcare Providers:  IncredibleEmployment.be  This test is no t yet approved or cleared by the [place] FDA and  has been authorized for detection and/or diagnosis of SARS-CoV-2 by FDA under an Emergency Use Authorization (EUA). This EUA will remain  in effect (meaning this test can be used) for the duration of the COVID-19 declaration under Section 564(b)(1) of the Act, 21 U.S.C.section 360bbb-3(b)(1), unless the authorization is terminated  or revoked sooner.       Influenza A by PCR NEGATIVE NEGATIVE Final   Influenza B by PCR NEGATIVE NEGATIVE Final    Comment: (NOTE) The Xpert Xpress SARS-CoV-2/FLU/RSV plus assay is intended as an aid in the diagnosis of influenza from Nasopharyngeal swab specimens and should not be used as a sole basis for treatment. Nasal washings and aspirates are unacceptable for Xpert Xpress SARS-CoV-2/FLU/RSV testing.  Fact Sheet for Patients: EntrepreneurPulse.com.au  Fact Sheet for Healthcare Providers: IncredibleEmployment.be  This test is not yet approved or cleared by the [place] FDA and has been authorized for detection and/or diagnosis of SARS-CoV-2 by FDA under an Emergency Use Authorization (EUA). This EUA will remain in effect (meaning this test can be used) for the duration of the COVID-19 declaration under Section 564(b)(1) of the Act, 21 U.S.C. section 360bbb-3(b)(1), unless the  authorization is terminated or revoked.  Performed at [facility], [address] [place], [place] [address]   Urine culture     Status: None   Collection Time: 01/23/20  9:11 PM   Specimen: In/Out Cath Urine  Result Value Ref Range Status   Specimen Description   Final    IN/OUT CATH URINE Performed at [facility], [address], [place], [place] [address]    Special Requests   Final    NONE Performed at [facility], [address], [place], [place] [address]    Culture   Final    NO GROWTH Performed at [facility], [place] [address] [place], [place] [address]    Report Status 01/25/2020 FINAL  Final  Culture, blood (single)     Status: None   Collection Time: 01/23/20 10:05 PM   Specimen: BLOOD  Result Value Ref Range Status   Specimen Description BLOOD BLOOD LEFT FOREARM  Final   Special Requests   Final    BOTTLES DRAWN AEROBIC ONLY Blood Culture results may not be optimal due to an inadequate volume of blood received in culture bottles   Culture   Final    NO GROWTH 5 DAYS Performed at [facility], [address] [place], [place] [address]    Report Status 01/28/2020 FINAL  Final  MRSA PCR Screening     Status: None   Collection Time: 01/24/20  1:00 AM   Specimen: Nasopharyngeal  Result Value Ref Range Status   MRSA by PCR NEGATIVE NEGATIVE Final    Comment:        The GeneXpert MRSA Assay (FDA approved for NASAL specimens only), is one component of a comprehensive MRSA colonization surveillance program. It is not intended to diagnose MRSA infection nor to guide or monitor treatment for MRSA infections. Performed at [facility], [address] [place], [place] [address]   Culture, respiratory (non-expectorated)     Status: None   Collection Time: 01/24/20 11:58 AM   Specimen: SPU; Respiratory  Result Value Ref Range Status   Specimen Description   Final    SPUTUM Performed at [facility], [address], [place], [place] [address]    Special Requests   Final    NONE Performed at [facility], [place], [place] [address]    Gram Stain NO WBC SEEN NO ORGANISMS SEEN   Final   Culture   Final    RARE Normal respiratory flora-no Staph aureus or Pseudomonas seen Performed at [facility], [place] [address] [place],  [address]    Report Status 01/26/2020 FINAL  Final         Radiology Studies: No results found.      Scheduled Meds: . [MAR Hold] allopurinol  100 mg Per NG tube Daily  . [MAR Hold] busPIRone  30 mg Per NG tube BID  . [MAR Hold] Chlorhexidine Gluconate Cloth  6 each Topical Daily  . [MAR Hold] enoxaparin (LOVENOX) injection  40 mg Subcutaneous Q24H  . [MAR Hold] escitalopram  30 mg Per Tube Daily  . [MAR Hold] folic acid  1 mg Per Tube Daily  . [MAR Hold] gabapentin  600 mg Per Tube Q8H  . [MAR Hold] mouth rinse  15 mL Mouth Rinse BID  . [MAR Hold] melatonin  5 mg Per Tube QHS  . [MAR Hold] multivitamin with minerals  1 tablet Per Tube Daily  . [MAR Hold] pantoprazole (PROTONIX) IV  40 mg Intravenous QHS  . [MAR Hold] pravastatin  20 mg Per NG tube q1800  . [MAR Hold] rOPINIRole  0.5 mg Per Tube QHS  . [MAR Hold] scopolamine  1 patch Transdermal Q72H  . white petrolatum       Continuous Infusions: . sodium chloride Stopped (01/24/20 0935)  . [facility] Hold]  cefoTEtan (CEFOTAN) IV    . dextrose 5 % and 0.45 % NaCl with KCl 20 mEq/L 75 mL/hr at 01/29/20 0308     LOS: 6 days    Time spent: 28 minutes    Sharen Hones, MD [facility]   To contact the attending provider between 7A-7P or the covering provider during after hours 7P-7A, please log into the web site [email or web address] and access using universal [place] password for that web site. If you do not have the password, please call the hospital operator.  01/29/2020, 10:53 AM

## 2020-01-29 NOTE — NC FL2 (Signed)
[place] LEVEL OF CARE SCREENING TOOL     IDENTIFICATION  Patient Name: Angela David Birthdate: 06-Mar-1940 Sex: female Admission Date (Current Location): 01/23/2020  [place] and [place] Number:  Engineering geologist and Address:  [facility], [address], [place], [place] [address]      Provider Number: [id number]  Attending Physician Name and Address:  Sharen Hones, MD  Relative Name and Phone Number:       Current Level of Care: Hospital Recommended Level of Care: [place] Prior Approval Number:    Date Approved/Denied:   PASRR Number: [id number]  Discharge Plan: SNF    Current Diagnoses: Patient Active Problem List   Diagnosis Date Noted  . Aspiration pneumonia of both lower lobes due to gastric secretions ([place]) 01/28/2020  . Acute metabolic encephalopathy 50/56/9794  . Acute respiratory failure ([place]) 01/23/2020  . Left lower lobe pneumonia 01/16/2020  . 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 Drug-induced polyneuropathy ([place]) 05/05/2019  . 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[place]) 05/05/2019  . Weakness   . Hypoxia   . Acute respiratory failure with hypoxia and hypercapnia ([place]) 03/21/2019  . Hypotension 03/21/2019  . Acute encephalopathy 03/21/2019  . Multiple fractures of ribs, right side, initial encounter for closed fracture 12/25/2018  . Colon polyp 11/06/2018  . Esophageal dysphagia   . 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 Stricture and stenosis of esophagus   . Elevated CEA 09/02/2018  . Goals of care, counseling/discussion 09/02/2018  . Polyp of transverse colon 09/02/2018  . Foreign body in right foot 05/26/2018  . History of tracheostomy 04/30/2018  . Moderate episode of recurrent major depressive disorder ([place]) 04/03/2018  . Poor balance 04/03/2018  . Shoulder lesion, right 02/28/2017  . Cervical radiculopathy 09/28/2016  . Peripheral neuropathy 08/28/2016  .  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 Leg weakness, bilateral 08/13/2016  . Gait abnormality 08/13/2016  . Carcinoma of overlapping sites of right breast in female, estrogen receptor negative ([place]) 12/27/2015  . Malignant neoplasm of breast ([place]) 03/28/2015  . 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 Hypercholesterolemia 03/28/2015  . Recurrent major depressive disorder, in partial remission ([place]) 03/01/2015  . 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 Cephalalgia 09/15/2014  . Headache due to trauma 09/15/2014  . Osteopenia 01/07/2014  . Age-related osteoporosis without current pathological fracture 01/07/2014  . History of colonic polyps 12/14/2013  . History of breast cancer 11/26/2012  . H/O malignant neoplasm of breast 11/26/2012  . 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 Tracheostomy present ([place]) 12/06/2011  . Calcification of bronchus or trachea 01/16/2011  . 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 Anxiety state 12/19/2010  . Blood in sputum 12/19/2010  . HLD (hyperlipidemia) 12/19/2010  . Type 2 diabetes mellitus ([place]) 12/19/2010    Orientation RESPIRATION BLADDER Height & Weight     Self, Time, Situation, Place  Tracheostomy, O2 (Has had trach for years. 5 L at 28%) Continent Weight: 164 lb 7.4 oz (74.6 kg) Height:  5\' 4"  (162.6 cm)  BEHAVIORAL SYMPTOMS/MOOD NEUROLOGICAL BOWEL NUTRITION STATUS     (None) Continent Feeding tube (PEG tube placed 12/3.)  AMBULATORY STATUS COMMUNICATION OF NEEDS Skin   Limited Assist Non-Verbally Bruising, Surgical wounds                       Personal Care Assistance Level of Assistance  Bathing, Feeding, Dressing Bathing Assistance: Maximum assistance Feeding assistance: Limited assistance Dressing  Assistance: Maximum assistance     Functional Limitations Info  Sight, Hearing, Speech Sight Info: Adequate Hearing Info: Adequate Speech Info: Impaired    SPECIAL CARE FACTORS FREQUENCY  PT  (By licensed PT), OT (By licensed OT)     PT Frequency: 5 x week OT Frequency: 5 x week            Contractures Contractures Info: Not present    Additional Factors Info  Code Status, Allergies Code Status Info: Partial: No CPR Allergies Info: Shellfish Allergy, Sodium Sulfite, Sulfa Antibiotics, Codeine           Current Medications (01/29/2020):  This is the current hospital active medication list Current Facility-Administered Medications  Medication Dose Route Frequency Provider Last Rate Last Admin  . 0.9 %  sodium chloride infusion  250 mL Intravenous Continuous Pabon, [place] F, MD 0 mL/hr at 01/24/20 0935 New Bag at 01/29/20 1032  . acetaminophen (OFIRMEV) IV 1,000 mg  1,000 mg Intravenous Q6H Pabon, Diego F, MD      . acetaminophen (TYLENOL) suppository 650 mg  650 mg Rectal Q6H PRN Pabon, Diego F, MD      . acetaminophen (TYLENOL) tablet 650 mg  650 mg Per NG tube Q6H PRN Flora Lipps, MD   650 mg at 01/25/20 1141  . albuterol (PROVENTIL) (2.5 MG/3ML) 0.083% nebulizer solution 2.5 mg  2.5 mg Nebulization Q4H PRN Pabon, Diego F, MD      . allopurinol (ZYLOPRIM) tablet 100 mg  100 mg Per NG tube Daily Pabon, Diego F, MD   100 mg at 01/25/20 1430  . busPIRone (BUSPAR) tablet 30 mg  30 mg Per NG tube BID Caroleen Hamman F, MD   30 mg at 01/25/20 2127  . Chlorhexidine Gluconate Cloth 2 % PADS 6 each  6 each Topical Daily Jules Husbands, MD   6 each at 01/28/20 1217  . dextrose 5 % and 0.45 % NaCl with KCl 20 mEq/L infusion   Intravenous Continuous Jules Husbands, MD   Stopped at 01/29/20 [id number]  . enoxaparin (LOVENOX) injection 40 mg  40 mg Subcutaneous Q24H Pabon, [place] F, MD   40 mg at 01/28/20 2204  . escitalopram (LEXAPRO) tablet 30 mg  30 mg Per Tube Daily Pabon, Diego F, MD   30 mg at 01/25/20 1431  . [START ON 01/30/2020] feeding supplement (OSMOLITE 1.5 CAL) liquid 1,000 mL  1,000 mL Per Tube Continuous Sharen Hones, MD      . Derrill Memo ON 01/30/2020] feeding supplement (PROSource  TF) liquid 45 mL  45 mL Per Tube Daily Sharen Hones, MD      . folic acid (FOLVITE) tablet 1 mg  1 mg Per Tube Daily Pabon, Diego F, MD   1 mg at 01/25/20 1430  . [START ON 01/30/2020] free water 100 mL  100 mL Per Tube Q4H Sharen Hones, MD      . gabapentin (NEURONTIN) 250 MG/5ML solution 600 mg  600 mg Per Tube Q8H Pabon, Diego F, MD   600 mg at 01/25/20 2127  . ketorolac (TORADOL) 15 MG/ML injection 15 mg  15 mg Intravenous Q6H Pabon, Diego F, MD      . MEDLINE mouth rinse  15 mL Mouth Rinse BID Dahlia Byes, [place] F, MD   15 mL at 01/28/20 2205  . melatonin tablet 5 mg  5 mg Per Tube QHS Caroleen Hamman F, MD   5 mg at 01/25/20 2126  . multivitamin with minerals tablet 1 tablet  1 tablet Per Tube Daily Jules Husbands, MD   1 tablet at 01/25/20 1430  . ondansetron (ZOFRAN) injection 4 mg  4 mg Intravenous Q6H PRN Pabon, Diego F, MD      . pantoprazole (PROTONIX) injection 40 mg  40 mg Intravenous QHS Caroleen Hamman F, MD   40 mg at 01/28/20 2204  . pravastatin (PRAVACHOL) tablet 20 mg  20 mg Per NG tube q1800 Pabon, Diego F, MD   20 mg at 01/25/20 1753  . rOPINIRole (REQUIP) tablet 0.5 mg  0.5 mg Per Tube QHS Pabon, Diego F, MD   0.5 mg at 01/25/20 2127  . scopolamine (TRANSDERM-SCOP) 1 MG/3DAYS 1.5 mg  1 patch Transdermal Q72H Pabon, Diego F, MD   1.5 mg at 01/28/20 1632  . white petrolatum (VASELINE) gel              Discharge Medications: Please see discharge summary for a list of discharge medications.  Relevant Imaging Results:  Relevant Lab Results:   Additional Information SS#: [id number]  Candie Chroman, LCSW

## 2020-01-29 NOTE — Progress Notes (Signed)
   01/28/20 2011  Assess: MEWS Score  Temp 97.6 F (36.4 C)  BP (!) 97/54  Pulse Rate (!) 117  Resp 16  SpO2 100 %  O2 Device Tracheostomy Collar  O2 Flow Rate (L/min) 5 L/min  Assess: MEWS Score  MEWS Temp 0  MEWS Systolic 1  MEWS Pulse 2  MEWS RR 0  MEWS LOC 0  MEWS Score 3  MEWS Score Color Yellow  Assess: if the MEWS score is Yellow or Red  Were vital signs taken at a resting state? Yes  Focused Assessment No change from prior assessment  Early Detection of Sepsis Score *See Row Information* Low  MEWS guidelines implemented *See Row Information* Yes  Treat  MEWS Interventions Other (Comment);Administered prn meds/treatments (Notified NP)  Take Vital Signs  Increase Vital Sign Frequency  Yellow: Q 2hr X 2 then Q 4hr X 2, if remains yellow, continue Q 4hrs  Escalate  MEWS: Escalate Yellow: discuss with charge nurse/RN and consider discussing with provider and RRT  Notify: Charge Nurse/RN  Name of Charge Nurse/RN Notified Crystal, RN  Date Charge Nurse/RN Notified 01/29/20  Time Charge Nurse/RN Notified 2015  Notify: Provider  Provider Name/Title Rufina Falco, NP  Date Provider Notified 01/29/20  Time Provider Notified 2020  Notification Type Face-to-face  Notification Reason Change in status (Soft BP; tachy)  Response See new orders  Date of Provider Response 01/29/20  Time of Provider Response 2025

## 2020-01-30 LAB — CBC WITH DIFFERENTIAL/PLATELET
Abs Immature Granulocytes: 0.08 10*3/uL — ABNORMAL HIGH (ref 0.00–0.07)
Basophils Absolute: 0 10*3/uL (ref 0.0–0.1)
Basophils Relative: 0 %
Eosinophils Absolute: 0 10*3/uL (ref 0.0–0.5)
Eosinophils Relative: 0 %
HCT: 36.9 % (ref 36.0–46.0)
Hemoglobin: 11.1 g/dL — ABNORMAL LOW (ref 12.0–15.0)
Immature Granulocytes: 1 %
Lymphocytes Relative: 6 %
Lymphs Abs: 0.8 10*3/uL (ref 0.7–4.0)
MCH: 26 pg (ref 26.0–34.0)
MCHC: 30.1 g/dL (ref 30.0–36.0)
MCV: 86.4 fL (ref 80.0–100.0)
Monocytes Absolute: 0.6 10*3/uL (ref 0.1–1.0)
Monocytes Relative: 4 %
Neutro Abs: 12 10*3/uL — ABNORMAL HIGH (ref 1.7–7.7)
Neutrophils Relative %: 89 %
Platelets: 209 10*3/uL (ref 150–400)
RBC: 4.27 MIL/uL (ref 3.87–5.11)
RDW: 13.3 % (ref 11.5–15.5)
WBC: 13.5 10*3/uL — ABNORMAL HIGH (ref 4.0–10.5)
nRBC: 0 % (ref 0.0–0.2)

## 2020-01-30 LAB — BASIC METABOLIC PANEL
Anion gap: 9 (ref 5–15)
BUN: 15 mg/dL (ref 8–23)
CO2: 23 mmol/L (ref 22–32)
Calcium: 7.6 mg/dL — ABNORMAL LOW (ref 8.9–10.3)
Chloride: 103 mmol/L (ref 98–111)
Creatinine, Ser: 0.78 mg/dL (ref 0.44–1.00)
GFR, Estimated: >60 mL/min (ref >60)
Glucose, Bld: 207 mg/dL — ABNORMAL HIGH (ref 70–99)
Potassium: 4.9 mmol/L (ref 3.5–5.1)
Sodium: 135 mmol/L (ref 135–145)

## 2020-01-30 LAB — GLUCOSE, CAPILLARY
Glucose-Capillary: 118 mg/dL — ABNORMAL HIGH (ref 70–99)
Glucose-Capillary: 117 mg/dL — ABNORMAL HIGH (ref 70–99)
Glucose-Capillary: 140 mg/dL — ABNORMAL HIGH (ref 70–99)

## 2020-01-30 LAB — MAGNESIUM: Magnesium: 2 mg/dL (ref 1.7–2.4)

## 2020-01-30 LAB — PHOSPHORUS: Phosphorus: 2.2 mg/dL — ABNORMAL LOW (ref 2.5–4.6)

## 2020-01-30 MED ORDER — POTASSIUM & SODIUM PHOSPHATES 280-160-250 MG PO PACK
2.0000 | PACK | Freq: Three times a day (TID) | ORAL | Status: AC
  Administered 2020-01-30 – 2020-01-31 (×4): 2
  Filled 2020-01-30 (×4): qty 2

## 2020-01-30 MED ORDER — FREE WATER
100.0000 mL | Status: DC
  Administered 2020-01-30 – 2020-01-31 (×4): 100 mL

## 2020-01-30 MED ORDER — INSULIN ASPART 100 UNIT/ML ~~LOC~~ SOLN
0.0000 [IU] | SUBCUTANEOUS | Status: DC
  Administered 2020-01-31: 5 [IU] via SUBCUTANEOUS
  Administered 2020-01-31: 3 [IU] via SUBCUTANEOUS
  Administered 2020-01-31: 5 [IU] via SUBCUTANEOUS
  Administered 2020-01-31: 3 [IU] via SUBCUTANEOUS
  Administered 2020-01-31: 5 [IU] via SUBCUTANEOUS
  Administered 2020-01-31 – 2020-02-01 (×2): 2 [IU] via SUBCUTANEOUS
  Administered 2020-02-01: 3 [IU] via SUBCUTANEOUS
  Administered 2020-02-01 – 2020-02-02 (×6): 2 [IU] via SUBCUTANEOUS
  Administered 2020-02-02: 5 [IU] via SUBCUTANEOUS
  Administered 2020-02-02 – 2020-02-03 (×4): 2 [IU] via SUBCUTANEOUS
  Filled 2020-01-30 (×19): qty 1

## 2020-01-30 NOTE — Progress Notes (Signed)
Pt. suctionned for small amt. Thick yellow plugs.  Inner cannula replaced. Pt. Tolerated well.

## 2020-01-30 NOTE — Progress Notes (Signed)
S: Patient underwent robot-assisted lap G-tube yesterday. She reports soreness at the surgical sites. No n/v  O: Today's Vitals   01/30/20 0018 01/30/20 0500 01/30/20 0515 01/30/20 0800  BP: 129/85  (!) 102/55   Pulse: (!) 105  90   Resp:   20   Temp: 98.2 F (36.8 C)  97.8 F (36.6 C)   TempSrc:   Oral   SpO2: 95%  100% 100%  Weight:  75 kg    Height:      PainSc:       Body mass index is 28.38 kg/m. Gen: NAD CV: well-perfused Pulm: on trach collar, no increased WOB. Abd: port sites with Dermabond, no erythema/induration; G-tube in place, capped  A/P: 79 y/o F with aspiration PNA, chronic trach, needing long-term feeding access. Now with G-tube. -OK to start TF per dietician recommendations. -General surgery will sign off, but we are available for assistance, if needed.

## 2020-01-30 NOTE — Plan of Care (Signed)
Continuing with plan of care. 

## 2020-01-30 NOTE — Progress Notes (Signed)
PROGRESS NOTE    Angela David  JKK:[id number] DOB: 1940/03/07 DOA: 01/23/2020 PCP: Iva Boop, MD   Chief complaint.  Shortness of breath. Brief Narrative:  44 yofemale presentedto the emergency department from [facility] of family concerns regarding worsening altered mental status.Upon arrival to the ED patient was unable to give any history due to lethargy.She hada recent admission at [facility] for altered mental status and suspected aspiration pneumonia.The patient was discharged 2 days ago on 01/21/2020 and family reported via ED documentation that the patient has undergone a drastic mental status change.ED work-up included aCT headwhich was negative for any acute abnormality.ABG revealed respiratory acidosis with hypoxia and hypercarbia and lab work showed leukocytosis. Initial lactic acid was unimpressive at 1.0,repeat 1.9,cultures and procalcitonin are pending.Chest x-ray shows no significant interval change, withhazy lung markings and unchanged potentialopacityvssmall left lower lobe pleural effusion. Patient has a chronic tracheostomy, she was placed on mechanical ventilation on 11/27. She is also fed with a Dobbhoff. She is off the ventilator on 12/1. Currently on oxygen via trach collar. PEG tube placed on 12/3   Assessment & Plan:   Active Problems:   Acute respiratory failure (HCC)   Aspiration pneumonia of both lower lobes due to gastric secretions (HCC)   Acute metabolic encephalopathy  #1 pure acute on chronic hypoxemic respiratory failure. Status post tracheostomy Bilateral lower lobe aspiration pneumonia. Patient hypoxemia had improved.  Completed 5 days of antibiotics. PEG tube placed on 12/3.  Start tube feeding today, tolerating it so far. Continue to monitor electrolytes including phosphorus.  2.  Chronic diastolic congestive heart failure. No evidence of volume overload. Discontinue IV fluids as patient has tolerating tube  feeding.  3.  acute metabolic encephalopathy. Condition had improved.  #4.  Paroxysmal atrial fibrillation. Not chronically on anticoagulation.  #5.  Hypophosphatemia. Supplemented.  #6.  Type 2 diabetes.  Uncontrolled with hyperglycemia. Start sliding scale insulin.    DVT prophylaxis: Lovenox Code Status: Partial code Family Communication: None Disposition Plan:  .   Status is: Inpatient  Remains inpatient appropriate because:Inpatient level of care appropriate due to severity of illness.  Need to monitor for new tube feeding and supplement phosphorus.   Dispo: The patient is from: Home              Anticipated d/c is to: SNF              Anticipated d/c date is: 2 days              Patient currently is not medically stable to d/c.        I/O last 3 completed shifts: In: 1863.4 [I.V.:1563.4; IV Piggyback:300] Out: 1 [Urine:1] Total I/O In: 580 [I.V.:480; IV Piggyback:100] Out: -      Consultants:   GS  Procedures: PEG  Antimicrobials:None  Subjective: Patient doing much better today.  She does not have a significant short of breath today.  She is off oxygen. No abdominal pain nausea vomiting.  Tolerating tube feeding. No fever or chills.  Objective: Vitals:   01/30/20 0018 01/30/20 0500 01/30/20 0515 01/30/20 0800  BP: 129/85  (!) 102/55   Pulse: (!) 105  90   Resp:   20   Temp: 98.2 F (36.8 C)  97.8 F (36.6 C)   TempSrc:   Oral   SpO2: 95%  100% 100%  Weight:  75 kg    Height:        Intake/Output Summary (Last 24 hours) at 01/30/2020 1240  Last data filed at 01/30/2020 0944 Gross per 24 hour  Intake 1202.99 ml  Output --  Net 1202.99 ml   Filed Weights   01/24/20 0407 01/29/20 1345 01/30/20 0500  Weight: 74.6 kg 74.6 kg 75 kg    Examination:  General exam: Appears calm and comfortable  Respiratory system: Clear to auscultation. Respiratory effort normal. Cardiovascular system: S1 & S2 heard, RRR. No JVD, murmurs, rubs,  gallops or clicks. No pedal edema. Gastrointestinal system: Abdomen is nondistended, soft and nontender. No organomegaly or masses felt. Normal bowel sounds heard. Central nervous system: Alert and oriented. No focal neurological deficits. Extremities: Symmetric  Skin: No rashes, lesions or ulcers Psychiatry: Mood & affect appropriate.     Data Reviewed: I have personally reviewed following labs and imaging studies  CBC: Recent Labs  Lab 01/23/20 2027 01/24/20 0230 01/26/20 0525 01/27/20 0614 01/28/20 0500 01/29/20 0408 01/30/20 0447  WBC 12.9*   < > 9.0 9.1 10.8* 10.3 13.5*  NEUTROABS 10.9*  --   --  6.9 8.6* 7.5 12.0*  HGB 13.4   < > 11.0* 11.7* 13.1 11.9* 11.1*  HCT 42.3   < > 35.8* 37.7 41.5 38.7 36.9  MCV 82.1   < > 85.0 83.8 84.0 84.1 86.4  PLT 277   < > 147* 170 202 211 209   < > = values in this interval not displayed.   Basic Metabolic Panel: Recent Labs  Lab 01/26/20 0525 01/26/20 0525 01/26/20 1726 01/27/20 0614 01/28/20 0500 01/29/20 0408 01/30/20 0447  NA 143  --   --  139 138 139 135  K 4.9  --   --  4.2 3.9 4.0 4.9  CL 107  --   --  100 99 102 103  CO2 28  --   --  29 27 24 23   GLUCOSE 184*  --   --  151* 162* 167* 207*  BUN 8  --   --  7* 11 18 15   CREATININE 0.71  --   --  0.81 0.76 0.94 0.78  CALCIUM 9.0  --   --  9.3 9.4 8.7* 7.6*  MG 2.1   < > 1.9 1.9 2.1 2.0 2.0  PHOS 3.0   < > 2.5 2.8 3.1 1.9* 2.2*   < > = values in this interval not displayed.   GFR: Estimated Creatinine Clearance: 58.4 mL/min (by C-G formula based on SCr of 0.78 mg/dL). Liver Function Tests: Recent Labs  Lab 01/23/20 2027  AST 22  ALT 25  ALKPHOS 69  BILITOT 0.6  PROT 8.0  ALBUMIN 4.0   No results for input(s): LIPASE, AMYLASE in the last 168 hours. Recent Labs  Lab 01/24/20 0230  AMMONIA 19   Coagulation Profile: Recent Labs  Lab 01/23/20 2027  INR 1.1   Cardiac Enzymes: No results for input(s): CKTOTAL, CKMB, CKMBINDEX, TROPONINI in the last 168  hours. BNP (last 3 results) No results for input(s): PROBNP in the last 8760 hours. HbA1C: No results for input(s): HGBA1C in the last 72 hours. CBG: Recent Labs  Lab 01/29/20 0818 01/29/20 1239 01/29/20 1547 01/29/20 2017 01/30/20 1136  GLUCAP 140* 160* 216* 220* 118*   Lipid Profile: No results for input(s): CHOL, HDL, LDLCALC, TRIG, CHOLHDL, LDLDIRECT in the last 72 hours. Thyroid Function Tests: No results for input(s): TSH, T4TOTAL, FREET4, T3FREE, THYROIDAB in the last 72 hours. Anemia Panel: No results for input(s): VITAMINB12, FOLATE, FERRITIN, TIBC, IRON, RETICCTPCT in the last 72 hours. Sepsis Labs:  Recent Labs  Lab 01/23/20 2027 01/23/20 2205 01/24/20 0120 01/24/20 0230 01/25/20 0640  PROCALCITON <0.10  --  <0.10  --  <0.10  LATICACIDVEN 1.0 1.9 7.2* 2.4*  --     Recent Results (from the past 240 hour(s))  Blood culture (routine single)     Status: None   Collection Time: 01/23/20  8:27 PM   Specimen: BLOOD  Result Value Ref Range Status   Specimen Description BLOOD BLOOD LEFT HAND  Final   Special Requests   Final    BOTTLES DRAWN AEROBIC AND ANAEROBIC Blood Culture adequate volume   Culture   Final    NO GROWTH 5 DAYS Performed at [facility], [address] [place], [place] [address]    Report Status 01/28/2020 FINAL  Final  Resp Panel by RT-PCR (Flu A&B, Covid) Nasopharyngeal Swab     Status: None   Collection Time: 01/23/20  8:27 PM   Specimen: Nasopharyngeal Swab; Nasopharyngeal(NP) swabs in vial transport medium  Result Value Ref Range Status   SARS Coronavirus 2 by RT PCR NEGATIVE NEGATIVE Final    Comment: (NOTE) SARS-CoV-2 target nucleic acids are NOT DETECTED.  The SARS-CoV-2 RNA is generally detectable in upper respiratory specimens during the acute phase of infection. The lowest concentration of SARS-CoV-2 viral copies this assay can detect is 138 copies/mL. A negative result does not preclude SARS-Cov-2 infection and  should not be used as the sole basis for treatment or other patient management decisions. A negative result may occur with  improper specimen collection/handling, submission of specimen other than nasopharyngeal swab, presence of viral mutation(s) within the areas targeted by this assay, and inadequate number of viral copies(<138 copies/mL). A negative result must be combined with clinical observations, patient history, and epidemiological information. The expected result is Negative.  Fact Sheet for Patients:  EntrepreneurPulse.com.au  Fact Sheet for Healthcare Providers:  IncredibleEmployment.be  This test is no t yet approved or cleared by the [place] FDA and  has been authorized for detection and/or diagnosis of SARS-CoV-2 by FDA under an Emergency Use Authorization (EUA). This EUA will remain  in effect (meaning this test can be used) for the duration of the COVID-19 declaration under Section 564(b)(1) of the Act, 21 U.S.C.section 360bbb-3(b)(1), unless the authorization is terminated  or revoked sooner.       Influenza A by PCR NEGATIVE NEGATIVE Final   Influenza B by PCR NEGATIVE NEGATIVE Final    Comment: (NOTE) The Xpert Xpress SARS-CoV-2/FLU/RSV plus assay is intended as an aid in the diagnosis of influenza from Nasopharyngeal swab specimens and should not be used as a sole basis for treatment. Nasal washings and aspirates are unacceptable for Xpert Xpress SARS-CoV-2/FLU/RSV testing.  Fact Sheet for Patients: EntrepreneurPulse.com.au  Fact Sheet for Healthcare Providers: IncredibleEmployment.be  This test is not yet approved or cleared by the [place] FDA and has been authorized for detection and/or diagnosis of SARS-CoV-2 by FDA under an Emergency Use Authorization (EUA). This EUA will remain in effect (meaning this test can be used) for the duration of the COVID-19 declaration  under Section 564(b)(1) of the Act, 21 U.S.C. section 360bbb-3(b)(1), unless the authorization is terminated or revoked.  Performed at [facility], [address] [place],  [address]   Urine culture     Status: None   Collection Time: 01/23/20  9:11 PM   Specimen: In/Out Cath Urine  Result Value Ref Range Status   Specimen Description   Final  IN/OUT CATH URINE Performed at [facility], [address] [place], [place] [address]    Special Requests   Final    NONE Performed at [facility], [address], [place], [place] [address]    Culture   Final    NO GROWTH Performed at [facility], [place] [address], [place], [place] [address]    Report Status 01/25/2020 FINAL  Final  Culture, blood (single)     Status: None   Collection Time: 01/23/20 10:05 PM   Specimen: BLOOD  Result Value Ref Range Status   Specimen Description BLOOD BLOOD LEFT FOREARM  Final   Special Requests   Final    BOTTLES DRAWN AEROBIC ONLY Blood Culture results may not be optimal due to an inadequate volume of blood received in culture bottles   Culture   Final    NO GROWTH 5 DAYS Performed at [facility], [address] [place], [place] [address]    Report Status 01/28/2020 FINAL  Final  MRSA PCR Screening     Status: None   Collection Time: 01/24/20  1:00 AM   Specimen: Nasopharyngeal  Result Value Ref Range Status   MRSA by PCR NEGATIVE NEGATIVE Final    Comment:        The GeneXpert MRSA Assay (FDA approved for NASAL specimens only), is one component of a comprehensive MRSA colonization surveillance program. It is not intended to diagnose MRSA infection nor to guide or monitor treatment for MRSA infections. Performed at [facility], [address] [place], [place] [address]   Culture, respiratory (non-expectorated)     Status: None   Collection Time: 01/24/20 11:58 AM   Specimen: SPU; Respiratory  Result Value Ref  Range Status   Specimen Description   Final    SPUTUM Performed at [facility], [address], [place], [place] [address]    Special Requests   Final    NONE Performed at [facility], [place], [place] [address]    Gram Stain NO WBC SEEN NO ORGANISMS SEEN   Final   Culture   Final    RARE Normal respiratory flora-no Staph aureus or Pseudomonas seen Performed at [facility], [place] [address] [place],  [address]    Report Status 01/26/2020 FINAL  Final         Radiology Studies: No results found.      Scheduled Meds: . allopurinol  100 mg Per NG tube Daily  . busPIRone  30 mg Per NG tube BID  . Chlorhexidine Gluconate Cloth  6 each Topical Daily  . enoxaparin (LOVENOX) injection  40 mg Subcutaneous Q24H  . escitalopram  30 mg Per Tube Daily  . feeding supplement (PROSource TF)  45 mL Per Tube Daily  . folic acid  1 mg Per Tube Daily  . free water  100 mL Per Tube Q4H  . gabapentin  600 mg Per Tube Q8H  . ketorolac  15 mg Intravenous Q6H  . mouth rinse  15 mL Mouth Rinse BID  . melatonin  5 mg Per Tube QHS  . multivitamin with minerals  1 tablet Per Tube Daily  . pantoprazole (PROTONIX) IV  40 mg Intravenous QHS  . potassium & sodium phosphates  2 packet Per Tube TID AC & HS  . pravastatin  20 mg Per NG tube q1800  . rOPINIRole  0.5 mg Per Tube QHS  . scopolamine  1 patch  Transdermal Q72H   Continuous Infusions: . sodium chloride Stopped (01/29/20 2020)  . acetaminophen Stopped (01/30/20 0602)  . feeding supplement (OSMOLITE 1.5 CAL) 1,000 mL (01/30/20 1139)     LOS: 7 days    Time spent: 27 minutes    Sharen Hones, MD [facility]   To contact the attending provider between 7A-7P or the covering provider during after hours 7P-7A, please log into the web site [email or web address] and access using universal [place] password for that web site. If you do not have the password, please call the hospital  operator.  01/30/2020, 12:40 PM

## 2020-01-30 NOTE — Progress Notes (Signed)
Patient seen for during routine rounds. No distress noted. Tolerated trach care and suctioning well. Patient has questions concerning changing of trach and if she will be transitioned back to type of trach that allows her to talk since her trach was changed due to having to be placed on the vent.  I explained this is a conversation that she needs to have with physicians in charge of her care as may be too early to say at this present time.

## 2020-01-31 ENCOUNTER — Inpatient Hospital Stay: Payer: Medicare Other

## 2020-01-31 DIAGNOSIS — R4182 Altered mental status, unspecified: Secondary | ICD-10-CM | POA: Diagnosis not present

## 2020-01-31 DIAGNOSIS — E1165 Type 2 diabetes mellitus with hyperglycemia: Secondary | ICD-10-CM

## 2020-01-31 DIAGNOSIS — I480 Paroxysmal atrial fibrillation: Secondary | ICD-10-CM

## 2020-01-31 DIAGNOSIS — R0689 Other abnormalities of breathing: Secondary | ICD-10-CM

## 2020-01-31 DIAGNOSIS — I5032 Chronic diastolic (congestive) heart failure: Secondary | ICD-10-CM

## 2020-01-31 LAB — BASIC METABOLIC PANEL
Anion gap: 7 (ref 5–15)
BUN: 16 mg/dL (ref 8–23)
CO2: 26 mmol/L (ref 22–32)
Calcium: 7.8 mg/dL — ABNORMAL LOW (ref 8.9–10.3)
Chloride: 105 mmol/L (ref 98–111)
Creatinine, Ser: 0.77 mg/dL (ref 0.44–1.00)
GFR, Estimated: >60 mL/min (ref >60)
Glucose, Bld: 252 mg/dL — ABNORMAL HIGH (ref 70–99)
Potassium: 4.5 mmol/L (ref 3.5–5.1)
Sodium: 138 mmol/L (ref 135–145)

## 2020-01-31 LAB — GLUCOSE, CAPILLARY
Glucose-Capillary: 136 mg/dL — ABNORMAL HIGH (ref 70–99)
Glucose-Capillary: 161 mg/dL — ABNORMAL HIGH (ref 70–99)
Glucose-Capillary: 205 mg/dL — ABNORMAL HIGH (ref 70–99)
Glucose-Capillary: 229 mg/dL — ABNORMAL HIGH (ref 70–99)
Glucose-Capillary: 263 mg/dL — ABNORMAL HIGH (ref 70–99)
Glucose-Capillary: 279 mg/dL — ABNORMAL HIGH (ref 70–99)
Glucose-Capillary: 294 mg/dL — ABNORMAL HIGH (ref 70–99)
Glucose-Capillary: 294 mg/dL — ABNORMAL HIGH (ref 70–99)
Glucose-Capillary: 305 mg/dL — ABNORMAL HIGH (ref 70–99)

## 2020-01-31 LAB — CBC WITH DIFFERENTIAL/PLATELET
Abs Immature Granulocytes: 0.07 10*3/uL (ref 0.00–0.07)
Basophils Absolute: 0 10*3/uL (ref 0.0–0.1)
Basophils Relative: 0 %
Eosinophils Absolute: 0.1 10*3/uL (ref 0.0–0.5)
Eosinophils Relative: 1 %
HCT: 35.6 % — ABNORMAL LOW (ref 36.0–46.0)
Hemoglobin: 10.6 g/dL — ABNORMAL LOW (ref 12.0–15.0)
Immature Granulocytes: 1 %
Lymphocytes Relative: 19 %
Lymphs Abs: 2.5 10*3/uL (ref 0.7–4.0)
MCH: 25.8 pg — ABNORMAL LOW (ref 26.0–34.0)
MCHC: 29.8 g/dL — ABNORMAL LOW (ref 30.0–36.0)
MCV: 86.6 fL (ref 80.0–100.0)
Monocytes Absolute: 0.8 10*3/uL (ref 0.1–1.0)
Monocytes Relative: 6 %
Neutro Abs: 9.6 10*3/uL — ABNORMAL HIGH (ref 1.7–7.7)
Neutrophils Relative %: 73 %
Platelets: 219 10*3/uL (ref 150–400)
RBC: 4.11 MIL/uL (ref 3.87–5.11)
RDW: 13.8 % (ref 11.5–15.5)
WBC: 13.2 10*3/uL — ABNORMAL HIGH (ref 4.0–10.5)
nRBC: 0 % (ref 0.0–0.2)

## 2020-01-31 LAB — BLOOD GAS, ARTERIAL
Acid-Base Excess: 6.6 mmol/L — ABNORMAL HIGH (ref 0.0–2.0)
Bicarbonate: 31.3 mmol/L — ABNORMAL HIGH (ref 20.0–28.0)
FIO2: 0.35
MECHVT: 450 mL
O2 Saturation: 99.5 %
PEEP: 5 cmH2O
Patient temperature: 37
RATE: 16 resp/min
pCO2 arterial: 44 mmHg (ref 32.0–48.0)
pH, Arterial: 7.46 — ABNORMAL HIGH (ref 7.350–7.450)
pO2, Arterial: 162 mmHg — ABNORMAL HIGH (ref 83.0–108.0)

## 2020-01-31 LAB — URINALYSIS, ROUTINE W REFLEX MICROSCOPIC
Bilirubin Urine: NEGATIVE
Glucose, UA: >=500 mg/dL — AB
Ketones, ur: NEGATIVE mg/dL
Nitrite: NEGATIVE
Protein, ur: 30 mg/dL — AB
RBC / HPF: >50 RBC/hpf — ABNORMAL HIGH (ref 0–5)
Specific Gravity, Urine: 1.007 (ref 1.005–1.030)
Squamous Epithelial / HPF: NONE SEEN (ref 0–5)
WBC, UA: >50 WBC/hpf — ABNORMAL HIGH (ref 0–5)
pH: 5 (ref 5.0–8.0)

## 2020-01-31 LAB — PHOSPHORUS: Phosphorus: 2.6 mg/dL (ref 2.5–4.6)

## 2020-01-31 LAB — MAGNESIUM: Magnesium: 2.2 mg/dL (ref 1.7–2.4)

## 2020-01-31 MED ORDER — LACTATED RINGERS IV BOLUS
500.0000 mL | Freq: Once | INTRAVENOUS | Status: AC
  Administered 2020-01-31: 500 mL via INTRAVENOUS

## 2020-01-31 MED ORDER — DEXMEDETOMIDINE HCL IN NACL 400 MCG/100ML IV SOLN: 0.4000 ug/kg/h | INTRAVENOUS | Status: DC

## 2020-01-31 MED ORDER — FUROSEMIDE 10 MG/ML IJ SOLN
20.0000 mg | Freq: Once | INTRAMUSCULAR | Status: AC
  Administered 2020-01-31: 20 mg via INTRAVENOUS
  Filled 2020-01-31: qty 4

## 2020-01-31 MED ORDER — ORAL CARE MOUTH RINSE
15.0000 mL | OROMUCOSAL | Status: DC
  Administered 2020-01-31 – 2020-02-05 (×39): 15 mL via OROMUCOSAL

## 2020-01-31 MED ORDER — FENTANYL CITRATE (PF) 100 MCG/2ML IJ SOLN: 50.0000 ug | INTRAMUSCULAR | Status: DC | PRN

## 2020-01-31 MED ORDER — ACETYLCYSTEINE 20 % IN SOLN
3.0000 mL | Freq: Two times a day (BID) | RESPIRATORY_TRACT | Status: DC
  Administered 2020-01-31: 4 mL via RESPIRATORY_TRACT
  Filled 2020-01-31 (×4): qty 4

## 2020-01-31 MED ORDER — INSULIN GLARGINE 100 UNIT/ML ~~LOC~~ SOLN
10.0000 [IU] | Freq: Every day | SUBCUTANEOUS | Status: DC
  Administered 2020-01-31 – 2020-02-12 (×13): 10 [IU] via SUBCUTANEOUS
  Filled 2020-01-31 (×14): qty 0.1

## 2020-01-31 MED ORDER — FREE WATER
100.0000 mL | Freq: Four times a day (QID) | Status: DC
  Administered 2020-01-31 – 2020-02-13 (×50): 100 mL

## 2020-01-31 MED ORDER — ALBUTEROL SULFATE (2.5 MG/3ML) 0.083% IN NEBU
2.5000 mg | INHALATION_SOLUTION | Freq: Two times a day (BID) | RESPIRATORY_TRACT | Status: DC
  Administered 2020-01-31 – 2020-02-07 (×13): 2.5 mg via RESPIRATORY_TRACT
  Filled 2020-01-31 (×13): qty 3

## 2020-01-31 MED ORDER — LACTATED RINGERS IV SOLN: INTRAVENOUS | Status: DC

## 2020-01-31 MED ORDER — CHLORHEXIDINE GLUCONATE 0.12% ORAL RINSE (MEDLINE KIT)
15.0000 mL | Freq: Two times a day (BID) | OROMUCOSAL | Status: DC
  Administered 2020-01-31 – 2020-02-13 (×24): 15 mL via OROMUCOSAL

## 2020-01-31 NOTE — Progress Notes (Signed)
PROGRESS NOTE    Angela David  ENI:[id number] DOB: November 29, 1940 DOA: 01/23/2020 PCP: Iva Boop, MD   Chief complaint.  Shortness of breath. Brief Narrative:  32 yofemale presentedto the emergency department from [facility] of family concerns regarding worsening altered mental status.Upon arrival to the ED patient was unable to give any history due to lethargy.She hada recent admission at [facility] for altered mental status and suspected aspiration pneumonia.The patient was discharged 2 days ago on 01/21/2020 and family reported via ED documentation that the patient has undergone a drastic mental status change.ED work-up included aCT headwhich was negative for any acute abnormality.ABG revealed respiratory acidosis with hypoxia and hypercarbia and lab work showed leukocytosis. Initial lactic acid was unimpressive at 1.0,repeat 1.9,cultures and procalcitonin are pending.Chest x-ray shows no significant interval change, withhazy lung markings and unchanged potentialopacityvssmall left lower lobe pleural effusion. Patient has a chronic tracheostomy, she was placed on mechanical ventilation on 11/27. She is also fed with a Dobbhoff. She is off the ventilator on 12/1. Currently on oxygen via trach collar. PEG tube placed on 12/3 Patient had episode of mucous plug on 12/5.  Suctioned, Mucomyst started.   Assessment & Plan:   Active Problems:   Acute respiratory failure (HCC)   Aspiration pneumonia of both lower lobes due to gastric secretions (HCC)   Acute metabolic encephalopathy  #1.  Acute on chronic hypoxemic respiratory failure, status post tracheostomy. Bilateral lower lobe aspiration pneumonia. Mucous plug. Patient developed mucous plug this morning, it appears that she had multiple similar episodes in the past.  Mucus removed after bagged respiration.  Added Mucomyst. Patient has completed antibiotics for aspiration pneumonia. PEG tube feeding  started yesterday, tolerating well.  2.  Chronic diastolic congestive heart failure. No volume overload.  3.  Acute metabolic encephalopathy. Condition had improved.  4.  Paroxysmal atrial fibrillation. Not chronic anticoagulation per  #5.  Uncontrolled type 2 diabetes with hyperglycemia. Glucose running high.  Added Lantus.  #6.  Hypophosphatemia. Phosphorus level normalized    DVT prophylaxis: Lovenox Code Status: Partial Family Communication: Husband updated Disposition Plan:  .   Status is: Inpatient  Remains inpatient appropriate because:Inpatient level of care appropriate due to severity of illness   Dispo: The patient is from: Home              Anticipated d/c is to: SNF              Anticipated d/c date is: 2 days              Patient currently is not medically stable to d/c.        I/O last 3 completed shifts: In: 2622.8 [I.V.:494.6; NG/GT:1728.2; IV Piggyback:400] Out: -  No intake/output data recorded.     Consultants:   None  Procedures: peg  Antimicrobials: none  Subjective: I was called to the bedside earlier this morning for rapid response team.  Patient had a mucous plug, she had a severe hypoxemia.  She was bagged for a few minutes, then suction up a thick mucous plug.  After that patient was breathing better.  Spoke with patient husband, patient had a frequent mucous plug in the past. Currently, patient does not have seen any short of breath. No fever chills per No abdominal pain or nausea vomiting. No dysuria hematuria.  Objective: Vitals:   01/31/20 0500 01/31/20 0556 01/31/20 0716 01/31/20 0813  BP:  (!) 142/91 (!) 161/77 (!) 159/69  Pulse:  (!) 115 (!) 131 (!) 120  Resp:  18  (!) 22  Temp:  98.5 F (36.9 C)  (!) 97.5 F (36.4 C)  TempSrc:      SpO2:  94% 92% 97%  Weight: 75 kg     Height:        Intake/Output Summary (Last 24 hours) at 01/31/2020 1214 Last data filed at 01/31/2020 0500 Gross per 24 hour  Intake  1828.16 ml  Output --  Net 1828.16 ml   Filed Weights   01/29/20 1345 01/30/20 0500 01/31/20 0500  Weight: 74.6 kg 75 kg 75 kg    Examination:  General exam: Appears calm and comfortable  Respiratory system: Clear to auscultation. Respiratory effort normal. Cardiovascular system: Regular and tachycardic. No JVD, murmurs, rubs, gallops or clicks. No pedal edema. Gastrointestinal system: Abdomen is nondistended, soft and nontender. No organomegaly or masses felt. Normal bowel sounds heard. Central nervous system: Alert and oriented. No focal neurological deficits. Extremities: Symmetric  Skin: No rashes, lesions or ulcers Psychiatry:  Mood & affect appropriate.     Data Reviewed: I have personally reviewed following labs and imaging studies  CBC: Recent Labs  Lab 01/27/20 0614 01/28/20 0500 01/29/20 0408 01/30/20 0447 01/31/20 0427  WBC 9.1 10.8* 10.3 13.5* 13.2*  NEUTROABS 6.9 8.6* 7.5 12.0* 9.6*  HGB 11.7* 13.1 11.9* 11.1* 10.6*  HCT 37.7 41.5 38.7 36.9 35.6*  MCV 83.8 84.0 84.1 86.4 86.6  PLT 170 202 211 209 557   Basic Metabolic Panel: Recent Labs  Lab 01/27/20 0614 01/28/20 0500 01/29/20 0408 01/30/20 0447 01/31/20 0427  NA 139 138 139 135 138  K 4.2 3.9 4.0 4.9 4.5  CL 100 99 102 103 105  CO2 29 27 24 23 26   GLUCOSE 151* 162* 167* 207* 252*  BUN 7* 11 18 15 16   CREATININE 0.81 0.76 0.94 0.78 0.77  CALCIUM 9.3 9.4 8.7* 7.6* 7.8*  MG 1.9 2.1 2.0 2.0 2.2  PHOS 2.8 3.1 1.9* 2.2* 2.6   GFR: Estimated Creatinine Clearance: 58.4 mL/min (by C-G formula based on SCr of 0.77 mg/dL). Liver Function Tests: No results for input(s): AST, ALT, ALKPHOS, BILITOT, PROT, ALBUMIN in the last 168 hours. No results for input(s): LIPASE, AMYLASE in the last 168 hours. No results for input(s): AMMONIA in the last 168 hours. Coagulation Profile: No results for input(s): INR, PROTIME in the last 168 hours. Cardiac Enzymes: No results for input(s): CKTOTAL, CKMB,  CKMBINDEX, TROPONINI in the last 168 hours. BNP (last 3 results) No results for input(s): PROBNP in the last 8760 hours. HbA1C: No results for input(s): HGBA1C in the last 72 hours. CBG: Recent Labs  Lab 01/30/20 1930 01/31/20 0000 01/31/20 0401 01/31/20 0710 01/31/20 0746  GLUCAP 140* 136* 229* 263* 294*   Lipid Profile: No results for input(s): CHOL, HDL, LDLCALC, TRIG, CHOLHDL, LDLDIRECT in the last 72 hours. Thyroid Function Tests: No results for input(s): TSH, T4TOTAL, FREET4, T3FREE, THYROIDAB in the last 72 hours. Anemia Panel: No results for input(s): VITAMINB12, FOLATE, FERRITIN, TIBC, IRON, RETICCTPCT in the last 72 hours. Sepsis Labs: Recent Labs  Lab 01/25/20 0640  PROCALCITON <0.10    Recent Results (from the past 240 hour(s))  Blood culture (routine single)     Status: None   Collection Time: 01/23/20  8:27 PM   Specimen: BLOOD  Result Value Ref Range Status   Specimen Description BLOOD BLOOD LEFT HAND  Final   Special Requests   Final    BOTTLES DRAWN AEROBIC AND ANAEROBIC Blood Culture adequate volume  Culture   Final    NO GROWTH 5 DAYS Performed at [facility], [address] [place], [place] [address]    Report Status 01/28/2020 FINAL  Final  Resp Panel by RT-PCR (Flu A&B, Covid) Nasopharyngeal Swab     Status: None   Collection Time: 01/23/20  8:27 PM   Specimen: Nasopharyngeal Swab; Nasopharyngeal(NP) swabs in vial transport medium  Result Value Ref Range Status   SARS Coronavirus 2 by RT PCR NEGATIVE NEGATIVE Final    Comment: (NOTE) SARS-CoV-2 target nucleic acids are NOT DETECTED.  The SARS-CoV-2 RNA is generally detectable in upper respiratory specimens during the acute phase of infection. The lowest concentration of SARS-CoV-2 viral copies this assay can detect is 138 copies/mL. A negative result does not preclude SARS-Cov-2 infection and should not be used as the sole basis for treatment or other patient management  decisions. A negative result may occur with  improper specimen collection/handling, submission of specimen other than nasopharyngeal swab, presence of viral mutation(s) within the areas targeted by this assay, and inadequate number of viral copies(<138 copies/mL). A negative result must be combined with clinical observations, patient history, and epidemiological information. The expected result is Negative.  Fact Sheet for Patients:  EntrepreneurPulse.com.au  Fact Sheet for Healthcare Providers:  IncredibleEmployment.be  This test is no t yet approved or cleared by the [place] FDA and  has been authorized for detection and/or diagnosis of SARS-CoV-2 by FDA under an Emergency Use Authorization (EUA). This EUA will remain  in effect (meaning this test can be used) for the duration of the COVID-19 declaration under Section 564(b)(1) of the Act, 21 U.S.C.section 360bbb-3(b)(1), unless the authorization is terminated  or revoked sooner.       Influenza A by PCR NEGATIVE NEGATIVE Final   Influenza B by PCR NEGATIVE NEGATIVE Final    Comment: (NOTE) The Xpert Xpress SARS-CoV-2/FLU/RSV plus assay is intended as an aid in the diagnosis of influenza from Nasopharyngeal swab specimens and should not be used as a sole basis for treatment. Nasal washings and aspirates are unacceptable for Xpert Xpress SARS-CoV-2/FLU/RSV testing.  Fact Sheet for Patients: EntrepreneurPulse.com.au  Fact Sheet for Healthcare Providers: IncredibleEmployment.be  This test is not yet approved or cleared by the [place] FDA and has been authorized for detection and/or diagnosis of SARS-CoV-2 by FDA under an Emergency Use Authorization (EUA). This EUA will remain in effect (meaning this test can be used) for the duration of the COVID-19 declaration under Section 564(b)(1) of the Act, 21 U.S.C. section 360bbb-3(b)(1), unless the  authorization is terminated or revoked.  Performed at [facility], [address] [place], [place] [address]   Urine culture     Status: None   Collection Time: 01/23/20  9:11 PM   Specimen: In/Out Cath Urine  Result Value Ref Range Status   Specimen Description   Final    IN/OUT CATH URINE Performed at [facility], [address], [place], [place] [address]    Special Requests   Final    NONE Performed at [facility], [address] [place], [place] [address]    Culture   Final    NO GROWTH Performed at [facility], [place] [address] [place], [place] [address]    Report Status 01/25/2020 FINAL  Final  Culture, blood (single)     Status: None   Collection Time: 01/23/20 10:05 PM   Specimen: BLOOD  Result Value Ref Range Status   Specimen  Description BLOOD BLOOD LEFT FOREARM  Final   Special Requests   Final    BOTTLES DRAWN AEROBIC ONLY Blood Culture results may not be optimal due to an inadequate volume of blood received in culture bottles   Culture   Final    NO GROWTH 5 DAYS Performed at [facility], [address] [place], [place] [address]    Report Status 01/28/2020 FINAL  Final  MRSA PCR Screening     Status: None   Collection Time: 01/24/20  1:00 AM   Specimen: Nasopharyngeal  Result Value Ref Range Status   MRSA by PCR NEGATIVE NEGATIVE Final    Comment:        The GeneXpert MRSA Assay (FDA approved for NASAL specimens only), is one component of a comprehensive MRSA colonization surveillance program. It is not intended to diagnose MRSA infection nor to guide or monitor treatment for MRSA infections. Performed at [facility], [address] [place], [place] [address]   Culture, respiratory (non-expectorated)     Status: None   Collection Time: 01/24/20 11:58 AM   Specimen: SPU; Respiratory  Result Value Ref Range Status   Specimen Description   Final    SPUTUM Performed at [facility], [address], [place], [place] [address]    Special Requests   Final    NONE Performed at [facility], [place], [place] [address]    Gram Stain NO WBC SEEN NO ORGANISMS SEEN   Final   Culture   Final    RARE Normal respiratory flora-no Staph aureus or Pseudomonas seen Performed at [facility], [place] [address] [place], [place] [address]    Report Status 01/26/2020 FINAL  Final         Radiology Studies: DG Chest Port 1 View  Result Date: 01/31/2020 CLINICAL DATA:  Tachycardia EXAM: PORTABLE CHEST 1 VIEW COMPARISON:  01/25/2020 FINDINGS: Mild interstitial opacity, unchanged. Tracheostomy tube tip is at the level of the clavicular heads. No pneumothorax or sizable pleural effusion. IMPRESSION: Unchanged mild interstitial opacity. Electronically Signed   By: Ulyses Jarred M.D.   On: 01/31/2020 05:39        Scheduled Meds: . acetylcysteine  3 mL Nebulization BID  . albuterol  2.5 mg Nebulization BID  . allopurinol  100 mg Per NG tube Daily  . busPIRone  30 mg Per NG tube BID  . Chlorhexidine Gluconate Cloth  6 each Topical Daily  . enoxaparin (LOVENOX) injection  40 mg Subcutaneous Q24H  . escitalopram  30 mg Per Tube Daily  . feeding supplement (PROSource TF)  45 mL Per Tube Daily  . folic acid  1 mg Per Tube Daily  . free water  100 mL Per Tube Q6H  . gabapentin  600 mg Per Tube Q8H  . insulin aspart  0-9 Units Subcutaneous Q4H  . insulin glargine  10 Units Subcutaneous QHS  . ketorolac  15 mg Intravenous Q6H  . mouth rinse  15 mL Mouth Rinse BID  . melatonin  5 mg Per Tube QHS  . multivitamin with minerals  1 tablet Per Tube Daily  . pantoprazole (PROTONIX) IV  40 mg Intravenous QHS  . pravastatin  20 mg Per NG tube q1800  . rOPINIRole  0.5 mg Per Tube QHS  . scopolamine  1 patch Transdermal Q72H   Continuous Infusions: . sodium chloride Stopped (01/29/20 2020)  . feeding supplement (OSMOLITE 1.5 CAL) 1,000 mL  (01/30/20  1139)     LOS: 8 days    Time spent: 35 minutes    Sharen Hones, MD [facility]   To contact the attending provider between 7A-7P or the covering provider during after hours 7P-7A, please log into the web site [email or web address] and access using universal [place] password for that web site. If you do not have the password, please call the hospital operator.  01/31/2020, 12:14 PM

## 2020-01-31 NOTE — Significant Event (Addendum)
Rapid Response Event Note   Reason for Call : called RR for patient with trach hx... hypoxic and decreased 02 sats.    Initial Focused Assessment: Entered room with patient laying in bed. Multiple staff members in room. Respiratory at bedside suctioning patient. Dr Roosevelt Locks at bedside.      Interventions: Respiratory at bedside, suctioning patient. Dr Roosevelt Locks in to assess. 02 sats back in 90's.   Plan of Care: Patients RN Claiborne Billings to call for further assistance     Event Summary:   MD Notified: [phone] Call Dietrich, RN

## 2020-01-31 NOTE — Progress Notes (Signed)
MEWS 2 - changed to sinus tach with slight increase in resps and slight decrease in O2 sat. Informed NP Randol Kern. Will continue to monitor and reassess.

## 2020-01-31 NOTE — Progress Notes (Signed)
Called Rapid during shift change for dropping O2 sats and increased heart rate. RT arrived at [phone] and Physician at [phone]. Oncoming RN, Kelly suctioned patient several times with no improvement. RT was able to dislodge very thick mucus plug, and O2 sats improved to 92%.

## 2020-01-31 NOTE — Progress Notes (Signed)
Patient transferred to ICU, report given to receiving nurse.

## 2020-01-31 NOTE — Progress Notes (Signed)
   01/31/20 1630  Assess: MEWS Score  Temp 98.2 F (36.8 C)  BP (!) 185/88  Pulse Rate (!) 121  Resp 20  Level of Consciousness Unresponsive  SpO2 91 %  O2 Device Nasal Cannula  Patient Activity (if Appropriate) In bed  O2 Flow Rate (L/min) 5 L/min  FiO2 (%) 28 %  Assess: if the MEWS score is Yellow or Red  Were vital signs taken at a resting state? Yes  Focused Assessment Change from prior assessment (see assessment flowsheet)  Early Detection of Sepsis Score *See Row Information* Medium  MEWS guidelines implemented *See Row Information* No, previously yellow, continue vital signs every 4 hours  Take Vital Signs  Increase Vital Sign Frequency  Yellow: Q 2hr X 2 then Q 4hr X 2, if remains yellow, continue Q 4hrs  Escalate  MEWS: Escalate Yellow: discuss with charge nurse/RN and consider discussing with provider and RRT  Notify: Charge Nurse/RN  Name of Charge Nurse/RN Notified Rochel Brome,   Date Charge Nurse/RN Notified 01/31/20  Time Charge Nurse/RN Notified 49  Notify: Provider  Provider Name/Title Dr. Roosevelt Locks  Date Provider Notified 01/31/20  Time Provider Notified 1630  Notification Type Page  Notification Reason Change in status  Response See new orders  Date of Provider Response 01/31/20  Time of Provider Response 1635  Notify: Rapid Response  Name of Rapid Response RN Notified Malachy Mood, RN  Date Rapid Response Notified 01/31/20  Time Rapid Response Notified 1630  Document  Patient Outcome Other (Comment) (pedning blood gas results)  Progress note created (see row info) Yes

## 2020-01-31 NOTE — Progress Notes (Signed)
This note also relates to the following rows which could not be included: ECG Heart Rate - Cannot attach notes to unvalidated device data    01/31/20 0813  Assess: MEWS Score  BP (!) 159/69  Pulse Rate (!) 120  Resp (!) 22  Level of Consciousness Alert  SpO2 97 %  O2 Device Tracheostomy Collar  Patient Activity (if Appropriate) In bed  O2 Flow Rate (L/min) 5 L/min  FiO2 (%) 28 %  Assess: if the MEWS score is Yellow or Red  Were vital signs taken at a resting state? Yes  Focused Assessment Change from prior assessment (see assessment flowsheet)  Early Detection of Sepsis Score *See Row Information* Low  MEWS guidelines implemented *See Row Information* Yes  Take Vital Signs  Increase Vital Sign Frequency  Yellow: Q 2hr X 2 then Q 4hr X 2, if remains yellow, continue Q 4hrs  Escalate  MEWS: Escalate Yellow: discuss with charge nurse/RN and consider discussing with provider and RRT  Notify: Charge Nurse/RN  Name of Charge Nurse/RN Notified Rochel Brome, RN  Date Charge Nurse/RN Notified 01/31/20  Time Charge Nurse/RN Notified 0813  Notify: Provider  Provider Name/Title Dr. Roosevelt Locks  Date Provider Notified 01/31/20  Time Provider Notified [phone]  Notification Type Page  Notification Reason Change in status  Response See new orders  Date of Provider Response 01/31/20  Time of Provider Response 0815  Document  Progress note created (see row info) Yes

## 2020-01-31 NOTE — Progress Notes (Signed)
Follow up - Critical Care Medicine Note  Patient Details:    Angela David is an 79 y.o. 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  Patient was transferred to the intensive care unit 12/5 after rapid response was called on the floor and the patient was noted to be obtunded and unresponsive.  She had had mucous plugging issues during the day.  Patient placed back on the ventilator.  Initially admitted on 11/27.  PCCM asked to assume care.  Lines, Airways, Drains: Gastrostomy/Enterostomy Gastrostomy 16 Fr. LUQ (Active)  Surrounding Skin Dry;Intact 01/31/20 0813  Tube Status Other (Comment) 01/31/20 0813  Drainage Appearance None 01/31/20 0813  Dressing Status Clean;Dry;Intact 01/31/20 0813  Dressing Intervention Dressing reinforced 01/31/20 0813  Dressing Type Split gauze 01/31/20 0813     Urethral Catheter Olen Pel NT Non-latex 14 Fr. (Active)  Indication for Insertion or Continuance of Catheter Unstable critically ill patients first 24-48 hours (See Criteria) 01/31/20 2000  Site Assessment Clean;Intact 01/31/20 2000  Catheter Maintenance Bag below level of bladder;Catheter secured;Drainage bag/tubing not touching floor;Insertion date on drainage bag;No dependent loops;Seal intact 01/31/20 2000  Collection Container Standard drainage bag 01/31/20 2000  Securement Method Securing device (Describe) 01/31/20 2000  Urinary Catheter Interventions (if applicable) Unclamped 41/74/08 2000  Output (mL) 1100 mL 01/31/20 1736    Anti-infectives:  Anti-infectives (From admission, onward)   Start     Dose/Rate Route Frequency Ordered Stop   01/29/20 0600  cefoTEtan (CEFOTAN) 2 g in sodium chloride 0.9 % 100 mL IVPB       Note to Pharmacy: on call to OR, please do not give on the floor   2 g 200 mL/hr over 30 Minutes Intravenous On call to O.R. 01/28/20 1032 01/29/20 1111   01/24/20 1000   ceFEPIme (MAXIPIME) 2 g in sodium chloride 0.9 % 100 mL IVPB  Status:  Discontinued        2 g 200 mL/hr over 30 Minutes Intravenous Every 12 hours 01/23/20 2219 01/24/20 0402   01/24/20 1000  vancomycin (VANCOREADY) IVPB 750 mg/150 mL  Status:  Discontinued        750 mg 150 mL/hr over 60 Minutes Intravenous Every 12 hours 01/23/20 2241 01/24/20 0402   01/23/20 2230  vancomycin (VANCOREADY) IVPB 1750 mg/350 mL        1,750 mg 175 mL/hr over 120 Minutes Intravenous  Once 01/23/20 2220 01/24/20 0045   01/23/20 2130  vancomycin (VANCOCIN) IVPB 1000 mg/200 mL premix  Status:  Discontinued        1,000 mg 200 mL/hr over 60 Minutes Intravenous  Once 01/23/20 2119 01/23/20 2220   01/23/20 2130  ceFEPIme (MAXIPIME) 2 g in sodium chloride 0.9 % 100 mL IVPB        2 g 200 mL/hr over 30 Minutes Intravenous  Once 01/23/20 2119 01/23/20 2211     Scheduled Meds: . acetylcysteine  3 mL Nebulization BID  . albuterol  2.5 mg Nebulization BID  . allopurinol  100 mg Per NG tube Daily  . busPIRone  30 mg Per NG tube BID  . chlorhexidine gluconate (MEDLINE KIT)  15 mL Mouth Rinse BID  . Chlorhexidine Gluconate Cloth  6 each Topical Daily  . enoxaparin (LOVENOX) injection  40 mg Subcutaneous Q24H  . escitalopram  30 mg Per Tube Daily  . feeding supplement (PROSource TF)  45 mL Per Tube Daily  . folic  acid  1 mg Per Tube Daily  . free water  100 mL Per Tube Q6H  . gabapentin  600 mg Per Tube Q8H  . insulin aspart  0-9 Units Subcutaneous Q4H  . insulin glargine  10 Units Subcutaneous QHS  . mouth rinse  15 mL Mouth Rinse 10 times per day  . melatonin  5 mg Per Tube QHS  . multivitamin with minerals  1 tablet Per Tube Daily  . pantoprazole (PROTONIX) IV  40 mg Intravenous QHS  . pravastatin  20 mg Per NG tube q1800  . rOPINIRole  0.5 mg Per Tube QHS  . scopolamine  1 patch Transdermal Q72H   Continuous Infusions: . sodium chloride Stopped (01/29/20 2020)  . dexmedetomidine (PRECEDEX) IV infusion     . feeding supplement (OSMOLITE 1.5 CAL) 1,000 mL (01/30/20 1139)  . lactated ringers 75 mL/hr at 01/31/20 1826   PRN Meds:.acetaminophen, acetaminophen, fentaNYL (SUBLIMAZE) injection, ondansetron (ZOFRAN) IV   Microbiology: Results for orders placed or performed during the hospital encounter of 01/23/20  Blood culture (routine single)     Status: None   Collection Time: 01/23/20  8:27 PM   Specimen: BLOOD  Result Value Ref Range Status   Specimen Description BLOOD BLOOD LEFT HAND  Final   Special Requests   Final    BOTTLES DRAWN AEROBIC AND ANAEROBIC Blood Culture adequate volume   Culture   Final    NO GROWTH 5 DAYS Performed at [facility], [address] [place], [place] [address]    Report Status 01/28/2020 FINAL  Final  Resp Panel by RT-PCR (Flu A&B, Covid) Nasopharyngeal Swab     Status: None   Collection Time: 01/23/20  8:27 PM   Specimen: Nasopharyngeal Swab; Nasopharyngeal(NP) swabs in vial transport medium  Result Value Ref Range Status   SARS Coronavirus 2 by RT PCR NEGATIVE NEGATIVE Final    Comment: (NOTE) SARS-CoV-2 target nucleic acids are NOT DETECTED.  The SARS-CoV-2 RNA is generally detectable in upper respiratory specimens during the acute phase of infection. The lowest concentration of SARS-CoV-2 viral copies this assay can detect is 138 copies/mL. A negative result does not preclude SARS-Cov-2 infection and should not be used as the sole basis for treatment or other patient management decisions. A negative result may occur with  improper specimen collection/handling, submission of specimen other than nasopharyngeal swab, presence of viral mutation(s) within the areas targeted by this assay, and inadequate number of viral copies(<138 copies/mL). A negative result must be combined with clinical observations, patient history, and epidemiological information. The expected result is Negative.  Fact Sheet for Patients:   EntrepreneurPulse.com.au  Fact Sheet for Healthcare Providers:  IncredibleEmployment.be  This test is no t yet approved or cleared by the [place] FDA and  has been authorized for detection and/or diagnosis of SARS-CoV-2 by FDA under an Emergency Use Authorization (EUA). This EUA will remain  in effect (meaning this test can be used) for the duration of the COVID-19 declaration under Section 564(b)(1) of the Act, 21 U.S.C.section 360bbb-3(b)(1), unless the authorization is terminated  or revoked sooner.       Influenza A by PCR NEGATIVE NEGATIVE Final   Influenza B by PCR NEGATIVE NEGATIVE Final    Comment: (NOTE) The Xpert Xpress SARS-CoV-2/FLU/RSV plus assay is intended as an aid in the diagnosis of influenza from Nasopharyngeal swab specimens and should not be used as a sole basis for treatment. Nasal washings and aspirates are unacceptable for Xpert Xpress SARS-CoV-2/FLU/RSV  testing.  Fact Sheet for Patients: EntrepreneurPulse.com.au  Fact Sheet for Healthcare Providers: IncredibleEmployment.be  This test is not yet approved or cleared by the [place] FDA and has been authorized for detection and/or diagnosis of SARS-CoV-2 by FDA under an Emergency Use Authorization (EUA). This EUA will remain in effect (meaning this test can be used) for the duration of the COVID-19 declaration under Section 564(b)(1) of the Act, 21 U.S.C. section 360bbb-3(b)(1), unless the authorization is terminated or revoked.  Performed at [facility], [address], [place], [place] [address]   Urine culture     Status: None   Collection Time: 01/23/20  9:11 PM   Specimen: In/Out Cath Urine  Result Value Ref Range Status   Specimen Description   Final    IN/OUT CATH URINE Performed at [facility], [address], [place], [place] [address]    Special Requests   Final    NONE Performed  at [facility], [address], [place], [place] [address]    Culture   Final    NO GROWTH Performed at [facility], [place] [address], [place], [place] [address]    Report Status 01/25/2020 FINAL  Final  Culture, blood (single)     Status: None   Collection Time: 01/23/20 10:05 PM   Specimen: BLOOD  Result Value Ref Range Status   Specimen Description BLOOD BLOOD LEFT FOREARM  Final   Special Requests   Final    BOTTLES DRAWN AEROBIC ONLY Blood Culture results may not be optimal due to an inadequate volume of blood received in culture bottles   Culture   Final    NO GROWTH 5 DAYS Performed at [facility], [address] [place], [place] [address]    Report Status 01/28/2020 FINAL  Final  MRSA PCR Screening     Status: None   Collection Time: 01/24/20  1:00 AM   Specimen: Nasopharyngeal  Result Value Ref Range Status   MRSA by PCR NEGATIVE NEGATIVE Final    Comment:        The GeneXpert MRSA Assay (FDA approved for NASAL specimens only), is one component of a comprehensive MRSA colonization surveillance program. It is not intended to diagnose MRSA infection nor to guide or monitor treatment for MRSA infections. Performed at [facility], [address] [place], [place] [address]   Culture, respiratory (non-expectorated)     Status: None   Collection Time: 01/24/20 11:58 AM   Specimen: SPU; Respiratory  Result Value Ref Range Status   Specimen Description   Final    SPUTUM Performed at [facility], [address], [place], [place] [address]    Special Requests   Final    NONE Performed at [facility], [place], [place] [address]    Gram Stain NO WBC SEEN NO ORGANISMS SEEN   Final   Culture   Final    RARE Normal respiratory flora-no Staph aureus or Pseudomonas seen Performed at [facility], [place] [address], [place], [place] [address]    Report Status 01/26/2020 FINAL  Final     Best Practice/Protocols:  VTE Prophylaxis: Lovenox (prophylaxtic dose) GI Prophylaxis: Proton Pump Inhibitor Continous Sedation Hyperglycemia (ICU) Vent/VAP  Events: 01/23/20 AMS in ED, hypercarbic- placed on ventilator > Admit to ICU 11/28 remains on vent, critically ill 11/30- Patient for IR guided Dobhoff due to NPO at this time. Plan for PEG 01/30/20 01/27/20-Patient is off ventilator  on trache collar.  Optimizing for downgrade to medical floor 01/29/2020: Gastrostomy placement by surgery 01/31/2020: Readmitted to ICU after rapid response patient found unresponsive with hypercarbia and hypoxia, back on ventilator  Studies: DG Chest 2 View  Result Date: 01/15/2020 CLINICAL DATA:  Change in mental status EXAM: CHEST - 2 VIEW COMPARISON:  Jul 20, 2019 FINDINGS: The heart size and mediastinal contours are within normal limits. Tracheostomy tube is seen at the level of the clavicular heads. Again noted is elevation of the right hemidiaphragm. There is blunting of the left costophrenic angle which be due to combination of effusion and airspace opacity. IMPRESSION: Left pleural effusion with adjacent patchy airspace opacity which could be due to atelectasis and/or infectious etiology. Electronically Signed   By: Prudencio Pair M.D.   On: 01/15/2020 16:08   DG Shoulder Right  Result Date: 01/07/2020 CLINICAL DATA:  [place] onto right shoulder EXAM: RIGHT SHOULDER - 2+ VIEW COMPARISON:  11/22/2010 FINDINGS: Frontal and transscapular views of the right shoulder are obtained. Bones are osteopenic. There is a comminuted intra-articular fracture distal margin right clavicle, with minimal distraction. No other acute bony abnormalities. Hypertrophic changes are noted of the acromioclavicular joint. Right chest is clear. IMPRESSION: 1. Comminuted intra-articular fracture distal margin right clavicle, with minimal separation of the fracture fragment. Electronically Signed   By: Randa Ngo M.D.   On:  01/07/2020 18:43   DG Tibia/Fibula Left  Result Date: 01/16/2020 CLINICAL DATA:  Initial evaluation for bilateral lower leg pain. EXAM: LEFT TIBIA AND FIBULA - 2 VIEW COMPARISON:  None. FINDINGS: Sequelae of prior ORIF at the tibial plateau/proximal tibia with lateral malleable plate screw fixation in place. No periprosthetic lucency to suggest loosening or failure. No acute fracture dislocation. No discrete osseous lesions. Osteoarthritic changes noted about the partially visualized knee. Posterior calcaneal enthesophyte noted. No visible soft tissue abnormality. IMPRESSION: 1. Sequelae of prior ORIF at the left tibial plateau/proximal tibia. No hardware complication. 2. No acute osseous abnormality about the left tibia/fibula. Electronically Signed   By: Jeannine Boga M.D.   On: 01/16/2020 04:00   DG Tibia/Fibula Right  Result Date: 01/16/2020 CLINICAL DATA:  Initial evaluation for bilateral lower leg pain. EXAM: RIGHT TIBIA AND FIBULA - 2 VIEW COMPARISON:  None. FINDINGS: No acute fracture or dislocation. Sclerotic focus at the proximal tibia noted, nonspecific, but most likely benign. Posterior calcaneal enthesophyte. No visible soft tissue abnormality. IMPRESSION: No acute osseous abnormality about the right tibia/fibula. No visible soft tissue abnormality. Electronically Signed   By: Jeannine Boga M.D.   On: 01/16/2020 04:03   DG Abd 1 View  Result Date: 01/24/2020 CLINICAL DATA:  OG tube placement EXAM: ABDOMEN - 1 VIEW COMPARISON:  11/19/2010 FINDINGS: The OG tube projects over the gastric body. The bowel gas pattern is nonobstructive. IMPRESSION: OG tube projects over the gastric body. Electronically Signed   By: Constance Holster M.D.   On: 01/24/2020 01:12   CT Head Wo Contrast  Result Date: 01/23/2020 CLINICAL DATA:  Altered mental status. EXAM: CT HEAD WITHOUT CONTRAST TECHNIQUE: Contiguous axial images were obtained from the base of the skull through the vertex  without intravenous contrast. COMPARISON:  January 15, 2020 FINDINGS: Brain: There is mild cerebral atrophy with widening of the extra-axial spaces and ventricular dilatation. There are areas of decreased attenuation within the white matter tracts of the supratentorial brain, consistent with microvascular disease changes. Vascular: No hyperdense vessel or unexpected calcification. Skull: Normal. Negative for fracture or focal lesion. Sinuses/Orbits:  No acute finding. Other: None. IMPRESSION: 1. Generalized cerebral atrophy. 2. No acute intracranial abnormality. Electronically Signed   By: Virgina Norfolk M.D.   On: 01/23/2020 21:05   CT Head Wo Contrast  Result Date: 01/15/2020 CLINICAL DATA:  Dizziness, nonspecific. Mental status change, unknown cause. Additional history provided: Hallucinations for 1 week. EXAM: CT HEAD WITHOUT CONTRAST TECHNIQUE: Contiguous axial images were obtained from the base of the skull through the vertex without intravenous contrast. COMPARISON:  Head CT 01/07/2020.  Brain MRI 03/21/2019. FINDINGS: Brain: Mild generalized cerebral atrophy. Advanced ill-defined hypoattenuation within the cerebral white matter is nonspecific, but compatible with chronic small vessel ischemic disease. There is no acute intracranial hemorrhage. No demarcated cortical infarct. No extra-axial fluid collection. No evidence of intracranial mass. No midline shift. Vascular: No hyperdense vessel.  Atherosclerotic calcifications. Skull: Normal. Negative for fracture or focal lesion. Sinuses/Orbits: Visualized orbits show no acute finding. Small volume frothy secretions within the right sphenoid sinus. Tiny right maxillary sinus mucous retention cyst. Trace fluid within the left mastoid air cells. IMPRESSION: No evidence of acute intracranial abnormality. Mild cerebral atrophy with advanced chronic small vessel ischemic disease, stable as compared to the head CT of 01/07/2020. Right sphenoid sinusitis.  Electronically Signed   By: Kellie Simmering DO   On: 01/15/2020 16:40   CT Head Wo Contrast  Result Date: 01/07/2020 CLINICAL DATA:  Head trauma, minor. Additional history provided: Unwitnessed mechanical fall hitting head. EXAM: CT HEAD WITHOUT CONTRAST TECHNIQUE: Contiguous axial images were obtained from the base of the skull through the vertex without intravenous contrast. COMPARISON:  Head CT 03/29/2019. FINDINGS: Brain: Mild generalized cerebral atrophy with advanced chronic small vessel ischemic disease, stable as compared to the prior head CT of 03/29/2019. There is no acute intracranial hemorrhage. No demarcated cortical infarct. No extra-axial fluid collection. No evidence of intracranial mass. No midline shift. Vascular: No hyperdense vessel.  Atherosclerotic calcifications Skull: Normal. Negative for fracture or focal lesion. Sinuses/Orbits: Visualized orbits show no acute finding. No significant paranasal sinus disease at the imaged levels. IMPRESSION: No evidence of acute intracranial abnormality. Mild cerebral atrophy with advanced chronic small vessel ischemic disease, stable as compared to the head CT of 03/29/2019 Electronically Signed   By: Kellie Simmering DO   On: 01/07/2020 18:38   CT ANGIO CHEST PE W OR WO CONTRAST  Result Date: 01/17/2020 CLINICAL DATA:  Suspected pulmonary embolus with low to intermediate probability. Positive D-dimer. Altered mental status. EXAM: CT ANGIOGRAPHY CHEST WITH CONTRAST TECHNIQUE: Multidetector CT imaging of the chest was performed using the standard protocol during bolus administration of intravenous contrast. Multiplanar CT image reconstructions and MIPs were obtained to evaluate the vascular anatomy. CONTRAST:  15m OMNIPAQUE IOHEXOL 350 MG/ML SOLN COMPARISON:  03/26/2019 FINDINGS: Cardiovascular: There is good opacification of the central and segmental pulmonary arteries. No focal filling defects. No evidence of significant pulmonary embolus. Cardiac  enlargement. No pericardial effusions. Coronary artery and aortic calcifications. Normal caliber thoracic aorta. No aortic dissection. Great vessel origins are patent. Mediastinum/Nodes: The esophagus is diffusely dilated and contains air and fluid. This may represent dysmotility or possibly outlet obstruction. Similar finding on previous study suggest chronic process. No significant lymphadenopathy. A tracheostomy tube is present. Lungs/Pleura: Fibrosis and scarring in the lung apices, greater on the right. Mild peripheral interstitial pattern likely also represents fibrosis. There are multiple subcentimeter nodules demonstrated throughout the right lung. These were also present on the previous study. Focal consolidation or atelectasis in the left lung base. Small  patchy infiltrative areas demonstrated in the left lingula. Changes may represent inflammatory process or aspiration. No pleural effusions. No pneumothorax. Upper Abdomen: Surgical absence of the gallbladder. No acute process demonstrated in the visualized upper abdomen. Musculoskeletal: Degenerative changes in the spine. Old deformity of the sternum, likely posttraumatic. No destructive bone lesions or acute changes identified. Review of the MIP images confirms the above findings. IMPRESSION: 1. No evidence of significant pulmonary embolus. 2. Focal consolidation or atelectasis in the left lung base. Small patchy infiltrative areas demonstrated in the left lingula. Changes may represent inflammatory process or aspiration. 3. Fibrosis and scarring in the lung apices, greater on the right. 4. Multiple subcentimeter nodules throughout the right lung. These were also present on the previous study. 5. Cardiac enlargement. 6. Dilated esophagus containing air and fluid. This may represent dysmotility or possibly outlet obstruction. Similar finding on previous study. 7. Tracheostomy tube. 8. Aortic atherosclerosis. Aortic Atherosclerosis (ICD10-I70.0).  Electronically Signed   By: Lucienne Capers M.D.   On: 01/17/2020 23:42   MR BRAIN WO CONTRAST  Result Date: 01/16/2020 CLINICAL DATA:  Mental status change EXAM: MRI HEAD WITHOUT CONTRAST TECHNIQUE: Multiplanar, multiecho pulse sequences of the brain and surrounding structures were obtained without intravenous contrast. COMPARISON:  03/21/2019 FINDINGS: Motion artifact is present. Brain: There is no acute infarction or intracranial hemorrhage. There is no intracranial mass, mass effect, or edema. There is no hydrocephalus or extra-axial fluid collection. Prominence of the ventricles and sulci reflects generalized parenchymal volume loss. Patchy and confluent areas of T2 hyperintensity in the supratentorial greater than pontine white matter are nonspecific but probably reflect advanced chronic microvascular ischemic changes. Vascular: Major vessel flow voids at the skull base are preserved. Skull and upper cervical spine: Normal marrow signal is preserved. Sinuses/Orbits: Paranasal sinuses are aerated. Orbits are unremarkable. Other: Sella is unremarkable.  Mastoid air cells are clear. IMPRESSION: No acute infarction, hemorrhage, or mass. Advanced chronic microvascular ischemic changes. Electronically Signed   By: Macy Mis M.D.   On: 01/16/2020 20:02   US Venous Img Lower Bilateral (DVT)  Result Date: 01/16/2020 CLINICAL DATA:  Initial evaluation for lower extremity swelling. EXAM: BILATERAL LOWER EXTREMITY VENOUS DOPPLER ULTRASOUND TECHNIQUE: Gray-scale sonography with graded compression, as well as color Doppler and duplex ultrasound were performed to evaluate the lower extremity deep venous systems from the level of the common femoral vein and including the common femoral, femoral, profunda femoral, popliteal and calf veins including the posterior tibial, peroneal and gastrocnemius veins when visible. The superficial great saphenous vein was also interrogated. Spectral Doppler was utilized to  evaluate flow at rest and with distal augmentation maneuvers in the common femoral, femoral and popliteal veins. COMPARISON:  None. FINDINGS: RIGHT LOWER EXTREMITY Common Femoral Vein: No evidence of thrombus. Normal compressibility, respiratory phasicity and response to augmentation. Saphenofemoral Junction: No evidence of thrombus. Normal compressibility and flow on color Doppler imaging. Profunda Femoral Vein: No evidence of thrombus. Normal compressibility and flow on color Doppler imaging. Femoral Vein: No evidence of thrombus. Normal compressibility, respiratory phasicity and response to augmentation. Popliteal Vein: No evidence of thrombus. Normal compressibility, respiratory phasicity and response to augmentation. Calf Veins: No evidence of thrombus. Normal compressibility and flow on color Doppler imaging. Superficial Great Saphenous Vein: No evidence of thrombus. Normal compressibility. Venous Reflux:  None. Other Findings:  None. LEFT LOWER EXTREMITY Common Femoral Vein: No evidence of thrombus. Normal compressibility, respiratory phasicity and response to augmentation. Saphenofemoral Junction: No evidence of thrombus. Normal compressibility and flow on  color Doppler imaging. Profunda Femoral Vein: No evidence of thrombus. Normal compressibility and flow on color Doppler imaging. Femoral Vein: No evidence of thrombus. Normal compressibility, respiratory phasicity and response to augmentation. Popliteal Vein: No evidence of thrombus. Normal compressibility, respiratory phasicity and response to augmentation. Calf Veins: No evidence of thrombus. Normal compressibility and flow on color Doppler imaging. Superficial Great Saphenous Vein: No evidence of thrombus. Normal compressibility. Venous Reflux:  None. Other Findings:  None. IMPRESSION: No evidence of deep venous thrombosis in either lower extremity. Electronically Signed   By: Jeannine Boga M.D.   On: 01/16/2020 01:32   DG Chest Port 1  View  Result Date: 01/31/2020 CLINICAL DATA:  Tachycardia EXAM: PORTABLE CHEST 1 VIEW COMPARISON:  01/25/2020 FINDINGS: Mild interstitial opacity, unchanged. Tracheostomy tube tip is at the level of the clavicular heads. No pneumothorax or sizable pleural effusion. IMPRESSION: Unchanged mild interstitial opacity. Electronically Signed   By: Ulyses Jarred M.D.   On: 01/31/2020 05:39   DG Chest Port 1 View  Result Date: 01/25/2020 CLINICAL DATA:  Altered mental status, shortness of breath EXAM: PORTABLE CHEST 1 VIEW COMPARISON:  01/23/2020 FINDINGS: Tracheostomy device is present. Enteric tube passes into the stomach with tip out of field of view. Patient is rotated. Stable elevation of the right hemidiaphragm. Likely chronic mild interstitial prominence. Persistent patchy density at the left lung base no significant pleural effusion. No pneumothorax. Stable cardiomediastinal contours. IMPRESSION: Persistent patchy atelectasis/consolidation at the left lung base. Electronically Signed   By: Macy Mis M.D.   On: 01/25/2020 08:31   DG Chest Port 1 View  Result Date: 01/23/2020 CLINICAL DATA:  Sepsis. EXAM: PORTABLE CHEST 1 VIEW COMPARISON:  January 18, 2020 FINDINGS: The tracheostomy tube is stable in positioning. The heart size is unchanged. Hazy coarse lung markings are again noted. Areas of fibrosis and scarring are noted at the lung apices. There is elevation of the right hemidiaphragm. Aortic calcifications are noted. IMPRESSION: No significant interval change. Electronically Signed   By: Constance Holster M.D.   On: 01/23/2020 20:53   DG Chest Port 1 View  Result Date: 01/18/2020 CLINICAL DATA:  Cough. EXAM: PORTABLE CHEST 1 VIEW COMPARISON:  Chest radiographs 01/15/2020 and CTA 01/17/2020 FINDINGS: The patient is rotated to the left. Tracheostomy tube overlies the airway. The cardiomediastinal silhouette is unchanged with normal heart size. There is persistent elevation of the right  hemidiaphragm. Patchy left basilar airspace opacity has decreased. No sizable pleural effusion or pneumothorax is identified. Old rib fractures are noted. IMPRESSION: Improved left basilar aeration. Electronically Signed   By: Logan Bores M.D.   On: 01/18/2020 08:11   DG Abd Portable 1V  Result Date: 01/26/2020 CLINICAL DATA:  NG tube placement EXAM: PORTABLE ABDOMEN - 1 VIEW COMPARISON:  01/24/2020 abdominal radiographs and prior. FINDINGS: Non weighted enteric tube tip and side hole project over the gastric body. Gas is seen within nondilated abdominal bowel loops. Right hemiabdomen surgical clips. Multilevel spondylosis. IMPRESSION: Non weighted enteric tube tip and side hole project over the gastric body. Electronically Signed   By: Primitivo Gauze M.D.   On: 01/26/2020 08:28   DG Swallowing Func-Speech Pathology  Result Date: 01/19/2020 Objective Swallowing Evaluation: Type of Study: MBS-Modified Barium Swallow Study  Patient Details Name: Danean Marner MRN: [id number] Date of Birth: 10-10-1940 Today's Date: 01/19/2020 Time: SLP Start Time (ACUTE ONLY): 1420 -SLP Stop Time (ACUTE ONLY): 1445 SLP Time Calculation (min) (ACUTE ONLY): 25 min Past Medical History: Past Medical History:  Diagnosis Date . Breast cancer ([place]) 2002  right breast chemo and radiation . Cancer [facility]) 2002  right breast . Diabetes mellitus without complication ([place])  . Diabetic peripheral neuropathy ([place]) 08/28/2016 . GERD (gastroesophageal reflux disease)  . Glaucoma  . High cholesterol  . History of colon polyps  . Peripheral neuropathy 08/28/2016 . Personal history of chemotherapy 2002  BREAST CA . Personal history of malignant neoplasm of breast  . Personal history of radiation therapy 2002  BREAST CA . Vocal cord paralysis 2012 Past Surgical History: Past Surgical History: Procedure Laterality Date . ABDOMINAL HYSTERECTOMY   . BREAST BIOPSY Right 2008  neg . BREAST EXCISIONAL BIOPSY Right 2002  positive . BREAST  LUMPECTOMY Right 2002  chemo and radiation . BREAST SURGERY Right 2002  LUMPECTOMY, radiation, chemotherapy . CHOLECYSTECTOMY N/A 12/10/2017  Procedure: LAPAROSCOPIC CHOLECYSTECTOMY;  Surgeon: Benjamine Sprague, DO;  Location: [facility] ORS;  Service: General;  Laterality: N/A; . COLON RESECTION Right 11/06/2018  Procedure: HAND ASSISTED LAPAROSCOPIC COLON RESECTION, RIGHT;  Surgeon: Jules Husbands, MD;  Location: [facility] ORS;  Service: General;  Laterality: Right; . COLONOSCOPY  2009,12/30/2013 . COLONOSCOPY WITH PROPOFOL N/A 04/16/2018  Tubulovillous polyp proximal transverse colon, large sessile polyp, proximal transverse colon;  Surgeon: Robert Bellow, MD;  Location: [facility] ENDOSCOPY;  Service: Endoscopy;  Laterality: N/A; . ESOPHAGOGASTRODUODENOSCOPY (EGD) WITH PROPOFOL N/A 10/14/2018  Procedure: ESOPHAGOGASTRODUODENOSCOPY (EGD) WITH PROPOFOL;  Surgeon: Lucilla Lame, MD;  Location: [facility] ENDOSCOPY;  Service: Endoscopy;  Laterality: N/A; . HYDROCELE EXCISION / REPAIR   . TRACHEAL SURGERY  2013 . TRACHEOSTOMY N/A   Dr Kathyrn Sheriff HPI: Angela David is a 79 y.o. female with medical history significant for hypertension, diabetes, history of trachea stenosis status post tracheostomy who presented to the ED on 01/15/20 with mental status changes with disorientation, not recognizing family, generalized weakness for several days.  Pt has been chronically trach dependent since 2013 and is followed by [facility] and Anda Latina (ENT, [facility]). Pertinant information: May 31, 2019: MBSS at [facility] - "Pt presents with a mild oral and mild to moderate pharyngeal dysphagia c/b reduced oral manipulation, delayed swallow initiation, reduced BOT retraction and reduced pharyngeal constriction. These led to increased oral transit time, vallecula residue (mild - thin liquids, moderate - solids), pyriform sinus residue (mild - thin liquids, moderate - solids and trace penetration of solids. Penetration cleared with swallows. Residue  significantly reduced with liquid rinse and subsequent swallows. Cervical esophagus: accumulation of solids within the UES/cervical esophagus essentially cleared with liquid rinse and subsequent swallows." June 24, 2019: The patient underwent MDL with excision of suprastomal granulation tissue and tracheal balloon dilation, and esophagoscopy with UES balloon dilation. Currently pt has a fenestrated #6 [place] with inability to tolerate inner cannula and PMSV but per her husband she can achieve phonation with finger occulsion. Per husband's report, she has difficulty swallowing with it varying between moderate to severe.  Subjective: pt pleasant, alert, attentive to study Assessment / Plan / Recommendation CHL IP CLINICAL IMPRESSIONS 01/19/2020 Clinical Impression Imaging is difficult d/t pt's poor position. Despite having pillows and support to sit upright pt leaned to her right with her head laying to her left. Pt was positioned in an oblique position for best imagaing. Pt doesn't occlud her trach when eating. Trials were given with trach unoccluded and with SLP finger occluding trach hub. No change is oropharyngeal abilities were observed. Pt has a very complex tracheal and esophageal history. This Modified Barium Swallow was provided to assess  for aspiration in an effort to return pt to an oral diet. Per attending report, pt's otolaryngologist doesn't have any further recommendations for trach management and her current ENT requested her trach to be left "as it is." Pt's HR continues to be elevated at ~ 100 but MD granted permission to have swallow study and pt's nurse was present during the study for close monitoring. Pt on 5L trach collar. Aspiration was not observed during the study but again clear visualization was difficult to achieve d/t pt positioning. Pt is not able to perform any compensatory swallow strategies.  Pt presents with severe sensori-motor oropharyngeal dysphagia. Pt's oral phase is c/b  decreased lingual manipulation of puree, mechanical soft, thin liquids and nectar thick liquids. Pt utilizes lingual pumping to help with posterior propulsion of boluses. Pt has premature spillage of all boluses into her pyriform sinuses and piecemeal swallowing. Her pharyngeal phase is characterized by extremely delayed swallow initiation, decreased pharyneal squeeze and limited amounts of bolus transiting through the UES with each swallow. As a result, > 90% of each bolus residues in the pharynx that is only cleared thru the UES after 5 to 6 swallows. At baseline, pt consumes her medicine whole in puree. During this study pt cleared the puree thru her UES but the barium tablet resides in the cervical esophagus and required liquid wash and multiple swallows to clear. When completing multiple swallows per bolus, pt's HR climbed to 116 and to 118. Pt is at an extremely high risk of aspirating all PO intake as well as having a cardiac response to multiple swallows. A conservative diet would be dysphagia 1 with thin liquids via cup, medicine crushed in puree. Also recommend a consult for Palliative Care to help with GOC.  SLP Visit Diagnosis Dysphagia, oropharyngeal phase (R13.12);Dysphagia, pharyngoesophageal phase (R13.14) Attention and concentration deficit following -- Frontal lobe and executive function deficit following -- Impact on safety and function Severe aspiration risk;Risk for inadequate nutrition/hydration   CHL IP TREATMENT RECOMMENDATION 01/19/2020 Treatment Recommendations No treatment recommended at this time   Prognosis 01/19/2020 Prognosis for Safe Diet Advancement Guarded Barriers to Reach Goals Severity of deficits;Time post onset Barriers/Prognosis Comment -- CHL IP DIET RECOMMENDATION 01/19/2020 SLP Diet Recommendations Dysphagia 1 (Puree) solids;Thin liquid Liquid Administration via Cup;No straw Medication Administration Whole meds with puree Compensations Minimize environmental  distractions;Slow rate;Small sips/bites Postural Changes Seated upright at 90 degrees   CHL IP OTHER RECOMMENDATIONS 01/19/2020 Recommended Consults (No Data) Oral Care Recommendations Oral care BID Other Recommendations --   CHL IP FOLLOW UP RECOMMENDATIONS 01/19/2020 Follow up Recommendations (No Data)   CHL IP FREQUENCY AND DURATION 01/18/2020 Speech Therapy Frequency (ACUTE ONLY) min 2x/week Treatment Duration 2 weeks      CHL IP ORAL PHASE 01/19/2020 Oral Phase Impaired Oral - Pudding Teaspoon -- Oral - Pudding Cup -- Oral - Honey Teaspoon -- Oral - Honey Cup -- Oral - Nectar Teaspoon Weak lingual manipulation;Lingual pumping;Reduced posterior propulsion;Holding of bolus;Lingual/palatal residue;Piecemeal swallowing;Delayed oral transit;Decreased bolus cohesion;Premature spillage Oral - Nectar Cup Weak lingual manipulation;Lingual pumping;Reduced posterior propulsion;Holding of bolus;Lingual/palatal residue;Piecemeal swallowing;Delayed oral transit;Decreased bolus cohesion;Premature spillage Oral - Nectar Straw -- Oral - Thin Teaspoon Weak lingual manipulation;Lingual pumping;Reduced posterior propulsion;Holding of bolus;Lingual/palatal residue;Piecemeal swallowing;Delayed oral transit;Decreased bolus cohesion;Premature spillage Oral - Thin Cup Weak lingual manipulation;Lingual pumping;Reduced posterior propulsion;Holding of bolus;Lingual/palatal residue;Piecemeal swallowing;Delayed oral transit;Decreased bolus cohesion;Premature spillage Oral - Thin Straw -- Oral - Puree Weak lingual manipulation;Lingual pumping;Reduced posterior propulsion;Holding of bolus;Lingual/palatal residue;Piecemeal swallowing;Delayed oral transit;Decreased bolus cohesion;Premature spillage  Oral - Mech Soft Impaired mastication;Weak lingual manipulation;Lingual pumping;Reduced posterior propulsion;Holding of bolus;Piecemeal swallowing;Delayed oral transit;Decreased bolus cohesion;Lingual/palatal residue Oral - Regular -- Oral -  Multi-Consistency -- Oral - Pill Weak lingual manipulation;Lingual pumping;Reduced posterior propulsion;Holding of bolus;Lingual/palatal residue;Piecemeal swallowing;Delayed oral transit;Decreased bolus cohesion;Premature spillage Oral Phase - Comment --  CHL IP PHARYNGEAL PHASE 01/19/2020 Pharyngeal Phase Impaired Pharyngeal- Pudding Teaspoon -- Pharyngeal -- Pharyngeal- Pudding Cup -- Pharyngeal -- Pharyngeal- Honey Teaspoon -- Pharyngeal -- Pharyngeal- Honey Cup -- Pharyngeal -- Pharyngeal- Nectar Teaspoon Delayed swallow initiation-pyriform sinuses;Reduced pharyngeal peristalsis;Reduced epiglottic inversion;Reduced anterior laryngeal mobility;Reduced laryngeal elevation;Reduced tongue base retraction;Pharyngeal residue - valleculae;Pharyngeal residue - pyriform;Pharyngeal residue - posterior pharnyx;Pharyngeal residue - cp segment;Lateral channel residue;Inter-arytenoid space residue Pharyngeal -- Pharyngeal- Nectar Cup Delayed swallow initiation-pyriform sinuses;Reduced pharyngeal peristalsis;Reduced epiglottic inversion;Reduced anterior laryngeal mobility;Reduced laryngeal elevation;Reduced tongue base retraction;Pharyngeal residue - pyriform;Pharyngeal residue - posterior pharnyx;Pharyngeal residue - cp segment;Pharyngeal residue - valleculae;Inter-arytenoid space residue;Lateral channel residue Pharyngeal -- Pharyngeal- Nectar Straw -- Pharyngeal -- Pharyngeal- Thin Teaspoon Delayed swallow initiation-pyriform sinuses;Reduced pharyngeal peristalsis;Reduced epiglottic inversion;Reduced anterior laryngeal mobility;Reduced laryngeal elevation;Reduced tongue base retraction;Pharyngeal residue - valleculae;Pharyngeal residue - pyriform;Pharyngeal residue - posterior pharnyx;Pharyngeal residue - cp segment;Inter-arytenoid space residue;Lateral channel residue Pharyngeal -- Pharyngeal- Thin Cup Delayed swallow initiation-pyriform sinuses;Reduced pharyngeal peristalsis;Reduced epiglottic inversion;Reduced anterior  laryngeal mobility;Reduced laryngeal elevation;Reduced tongue base retraction;Pharyngeal residue - valleculae;Pharyngeal residue - pyriform;Pharyngeal residue - posterior pharnyx;Pharyngeal residue - cp segment;Inter-arytenoid space residue;Lateral channel residue Pharyngeal -- Pharyngeal- Thin Straw -- Pharyngeal -- Pharyngeal- Puree Delayed swallow initiation-vallecula;Delayed swallow initiation-pyriform sinuses;Reduced pharyngeal peristalsis;Reduced anterior laryngeal mobility;Reduced laryngeal elevation;Reduced tongue base retraction;Pharyngeal residue - valleculae;Pharyngeal residue - pyriform;Pharyngeal residue - posterior pharnyx;Pharyngeal residue - cp segment;Inter-arytenoid space residue;Lateral channel residue Pharyngeal -- Pharyngeal- Mechanical Soft Delayed swallow initiation-pyriform sinuses;Reduced pharyngeal peristalsis;Reduced epiglottic inversion;Reduced anterior laryngeal mobility;Reduced laryngeal elevation;Reduced tongue base retraction;Pharyngeal residue - valleculae;Pharyngeal residue - pyriform;Pharyngeal residue - posterior pharnyx;Pharyngeal residue - cp segment;Inter-arytenoid space residue;Lateral channel residue Pharyngeal -- Pharyngeal- Regular -- Pharyngeal -- Pharyngeal- Multi-consistency -- Pharyngeal -- Pharyngeal- Pill Delayed swallow initiation-pyriform sinuses;Reduced pharyngeal peristalsis;Reduced anterior laryngeal mobility;Reduced laryngeal elevation;Reduced tongue base retraction;Pharyngeal residue - valleculae;Pharyngeal residue - pyriform;Pharyngeal residue - posterior pharnyx;Pharyngeal residue - cp segment;Inter-arytenoid space residue Pharyngeal -- Pharyngeal Comment --  CHL IP CERVICAL ESOPHAGEAL PHASE 01/19/2020 Cervical Esophageal Phase Impaired Pudding Teaspoon -- Pudding Cup -- Honey Teaspoon -- Honey Cup -- Nectar Teaspoon -- Nectar Cup -- Nectar Straw -- Thin Teaspoon -- Thin Cup -- Thin Straw -- Puree -- Mechanical Soft -- Regular -- Multi-consistency -- Pill (No  Data) Cervical Esophageal Comment -- Happi B. Rutherford Nail M.S., CCC-SLP, [facility] Speech-Language Pathologist Rehabilitation Services Office [phone] Stormy Fabian 01/19/2020, 8:00 PM              ECHOCARDIOGRAM COMPLETE  Result Date: 01/17/2020    ECHOCARDIOGRAM REPORT   Patient Name:   Kisha SMALL Babilonia Date of Exam: 01/17/2020 Medical Rec #:  [id number]           Height:       64.0 in Accession #:    [id number]          Weight:       155.0 lb Date of Birth:  14-Aug-1940          BSA:          1.756 m Patient Age:    4 years            BP:           168/81 mmHg Patient Gender: F  HR:           105 bpm. Exam Location:  [facility] Procedure: 2D Echo, Cardiac Doppler and Color Doppler Indications:     Atrial fibrillation 427.31  History:         Patient has prior history of Echocardiogram examinations, most                  recent 03/24/2019. Risk Factors:Diabetes. High cholesterol.  Sonographer:     Sherrie Sport RDCS (AE) Referring Phys:  [id number] Claiborne Billings A GRIFFITH Diagnosing Phys: Serafina Royals MD  Sonographer Comments: Technically difficult study due to poor echo windows, no subcostal window and no apical window. IMPRESSIONS  1. Left ventricular ejection fraction, by estimation, is 60 to 65%. The left ventricle has normal function. The left ventricle has no regional wall motion abnormalities. Left ventricular diastolic function could not be evaluated.  2. Right ventricular systolic function is normal. The right ventricular size is normal.  3. The mitral valve is grossly normal. No evidence of mitral valve regurgitation.  4. The aortic valve is grossly normal. Aortic valve regurgitation is not visualized. FINDINGS  Left Ventricle: Left ventricular ejection fraction, by estimation, is 60 to 65%. The left ventricle has normal function. The left ventricle has no regional wall motion abnormalities. The left ventricular internal cavity size was normal in size. There is  no left ventricular hypertrophy. Left  ventricular diastolic function could not be evaluated. Right Ventricle: The right ventricular size is normal. No increase in right ventricular wall thickness. Right ventricular systolic function is normal. Left Atrium: Left atrial size was normal in size. Right Atrium: Right atrial size was normal in size. Pericardium: There is no evidence of pericardial effusion. Mitral Valve: The mitral valve is grossly normal. No evidence of mitral valve regurgitation. Tricuspid Valve: The tricuspid valve is grossly normal. Tricuspid valve regurgitation is not demonstrated. Aortic Valve: The aortic valve is grossly normal. Aortic valve regurgitation is not visualized. Pulmonic Valve: The pulmonic valve was not assessed. Pulmonic valve regurgitation is not visualized. Aorta: The aortic root and ascending aorta are structurally normal, with no evidence of dilitation. IAS/Shunts: The interatrial septum was not assessed.  LEFT VENTRICLE PLAX 2D LVIDd:         3.22 cm LVIDs:         1.78 cm LV PW:         1.10 cm LV IVS:        1.19 cm LVOT diam:     2.00 cm LVOT Area:     3.14 cm  LEFT ATRIUM         Index LA diam:    1.80 cm 1.03 cm/m   AORTA Ao Root diam: 2.30 cm  SHUNTS Systemic Diam: 2.00 cm Serafina Royals MD Electronically signed by Serafina Royals MD Signature Date/Time: 01/17/2020/8:39:33 AM    Final    Results for orders placed or performed during the hospital encounter of 01/23/20 (from the past 24 hour(s))  Glucose, capillary     Status: Abnormal   Collection Time: 01/31/20 12:00 AM  Result Value Ref Range   Glucose-Capillary 136 (H) 70 - 99 mg/dL  Glucose, capillary     Status: Abnormal   Collection Time: 01/31/20  4:01 AM  Result Value Ref Range   Glucose-Capillary 229 (H) 70 - 99 mg/dL  CBC with Differential/Platelet     Status: Abnormal   Collection Time: 01/31/20  4:27 AM  Result Value Ref Range   WBC 13.2 (H) 4.0 -  10.5 K/uL   RBC 4.11 3.87 - 5.11 MIL/uL   Hemoglobin 10.6 (L) 12.0 - 15.0 g/dL   HCT  35.6 (L) 36 - 46 %   MCV 86.6 80.0 - 100.0 fL   MCH 25.8 (L) 26.0 - 34.0 pg   MCHC 29.8 (L) 30.0 - 36.0 g/dL   RDW 13.8 11.5 - 15.5 %   Platelets 219 150 - 400 K/uL   nRBC 0.0 0.0 - 0.2 %   Neutrophils Relative % 73 %   Neutro Abs 9.6 (H) 1.7 - 7.7 K/uL   Lymphocytes Relative 19 %   Lymphs Abs 2.5 0.7 - 4.0 K/uL   Monocytes Relative 6 %   Monocytes Absolute 0.8 0.1 - 1.0 K/uL   Eosinophils Relative 1 %   Eosinophils Absolute 0.1 0.0 - 0.5 K/uL   Basophils Relative 0 %   Basophils Absolute 0.0 0.0 - 0.1 K/uL   Immature Granulocytes 1 %   Abs Immature Granulocytes 0.07 0.00 - 0.07 K/uL  Magnesium     Status: None   Collection Time: 01/31/20  4:27 AM  Result Value Ref Range   Magnesium 2.2 1.7 - 2.4 mg/dL  Phosphorus     Status: None   Collection Time: 01/31/20  4:27 AM  Result Value Ref Range   Phosphorus 2.6 2.5 - 4.6 mg/dL  Basic metabolic panel     Status: Abnormal   Collection Time: 01/31/20  4:27 AM  Result Value Ref Range   Sodium 138 135 - 145 mmol/L   Potassium 4.5 3.5 - 5.1 mmol/L   Chloride 105 98 - 111 mmol/L   CO2 26 22 - 32 mmol/L   Glucose, Bld 252 (H) 70 - 99 mg/dL   BUN 16 8 - 23 mg/dL   Creatinine, Ser 0.77 0.44 - 1.00 mg/dL   Calcium 7.8 (L) 8.9 - 10.3 mg/dL   GFR, Estimated >60 >60 mL/min   Anion gap 7 5 - 15  Glucose, capillary     Status: Abnormal   Collection Time: 01/31/20  7:10 AM  Result Value Ref Range   Glucose-Capillary 263 (H) 70 - 99 mg/dL  Glucose, capillary     Status: Abnormal   Collection Time: 01/31/20  7:46 AM  Result Value Ref Range   Glucose-Capillary 294 (H) 70 - 99 mg/dL  Glucose, capillary     Status: Abnormal   Collection Time: 01/31/20 12:18 PM  Result Value Ref Range   Glucose-Capillary 279 (H) 70 - 99 mg/dL  Glucose, capillary     Status: Abnormal   Collection Time: 01/31/20  3:46 PM  Result Value Ref Range   Glucose-Capillary 294 (H) 70 - 99 mg/dL  Blood gas, arterial     Status: Abnormal (Preliminary result)    Collection Time: 01/31/20  4:34 PM  Result Value Ref Range   FIO2 0.35    Delivery systems TC    pH, Arterial 7.23 (L) 7.35 - 7.45   pCO2 arterial 85 (HH) 32 - 48 mmHg   pO2, Arterial 62 (L) 83 - 108 mmHg   Bicarbonate 35.6 (H) 20.0 - 28.0 mmol/L   Acid-Base Excess 5.0 (H) 0.0 - 2.0 mmol/L   O2 Saturation 86.5 %   Patient temperature 37.0    Collection site LEFT RADIAL    Sample type ARTERIAL DRAW    Mechanical Rate PENDING   Glucose, capillary     Status: Abnormal   Collection Time: 01/31/20  5:14 PM  Result Value Ref Range  Glucose-Capillary 305 (H) 70 - 99 mg/dL  Urinalysis, Routine w reflex microscopic Urine, Catheterized     Status: Abnormal   Collection Time: 01/31/20  5:42 PM  Result Value Ref Range   Color, Urine YELLOW (A) YELLOW   APPearance CLOUDY (A) CLEAR   Specific Gravity, Urine 1.007 1.005 - 1.030   pH 5.0 5.0 - 8.0   Glucose, UA >=500 (A) NEGATIVE mg/dL   Hgb urine dipstick LARGE (A) NEGATIVE   Bilirubin Urine NEGATIVE NEGATIVE   Ketones, ur NEGATIVE NEGATIVE mg/dL   Protein, ur 30 (A) NEGATIVE mg/dL   Nitrite NEGATIVE NEGATIVE   Leukocytes,Ua LARGE (A) NEGATIVE   RBC / HPF >50 (H) 0 - 5 RBC/hpf   WBC, UA >50 (H) 0 - 5 WBC/hpf   Bacteria, UA RARE (A) NONE SEEN   Squamous Epithelial / LPF NONE SEEN 0 - 5   WBC Clumps PRESENT    Budding Yeast PRESENT   Glucose, capillary     Status: Abnormal   Collection Time: 01/31/20  7:12 PM  Result Value Ref Range   Glucose-Capillary 205 (H) 70 - 99 mg/dL  Blood gas, arterial     Status: Abnormal   Collection Time: 01/31/20  9:02 PM  Result Value Ref Range   FIO2 0.35    Delivery systems VENTILATOR    Mode PRESSURE REGULATED VOLUME CONTROL    VT 450 mL   LHR 16 resp/min   Peep/cpap 5.0 cm H20   pH, Arterial 7.46 (H) 7.35 - 7.45   pCO2 arterial 44 32 - 48 mmHg   pO2, Arterial 162 (H) 83 - 108 mmHg   Bicarbonate 31.3 (H) 20.0 - 28.0 mmol/L   Acid-Base Excess 6.6 (H) 0.0 - 2.0 mmol/L   O2 Saturation 99.5 %    Patient temperature 37.0    Collection site LEFT RADIAL    Sample type ARTERIAL DRAW    Allens test (pass/fail) PASS PASS   Consults: Palliative Care PCCM  Subjective:    Overnight Issues: Progressive issues with encephalopathy today, mucous plugging.  Objective:  Vital signs for last 24 hours: Temp:  [97.5 F (36.4 C)-98.5 F (36.9 C)] 98 F (36.7 C) (12/05 2000) Pulse Rate:  [89-131] 98 (12/05 2115) Resp:  [9-33] 15 (12/05 2115) BP: (79-189)/(48-97) 119/53 (12/05 2115) SpO2:  [91 %-100 %] 99 % (12/05 2123) FiO2 (%):  [28 %-50 %] 35 % (12/05 2123) Weight:  [75 kg] 75 kg (12/05 0500)  Hemodynamic parameters for last 24 hours:    Intake/Output from previous day: 12/04 0701 - 12/05 0700 In: 2408.2 [I.V.:480; NG/GT:1728.2; IV Piggyback:200] Out: -   Intake/Output this shift: No intake/output data recorded.  Vent settings for last 24 hours: Vent Mode: PRVC FiO2 (%):  [28 %-50 %] 35 % Set Rate:  [16 bmp] 16 bmp Vt Set:  [450 mL] 450 mL PEEP:  [5 cmH20] 5 cmH20  Physical Exam:  GENERAL: Chronically ill-appearing woman, debilitated, obtunded, tracheostomy in place.  Patient now on ventilator. HEAD: Normocephalic, atraumatic.  EYES: Pupils equal, round, reactive to light.  No scleral icterus.  MOUTH: Oral mucosa dry.  No thrush. NECK: Supple. No thyromegaly.  Tracheostomy present. No JVD.  No adenopathy. PULMONARY: Good air entry bilaterally on vent.  Coarse breath sounds throughout  CARDIOVASCULAR: S1 and S2.  Tachycardic to 130s and regular rhythm.  No rubs, murmurs or gallops heard. ABDOMEN: Gastrostomy tube in place.  Multiple small incisions healing well. MUSCULOSKELETAL: No joint deformity, no clubbing, no edema.  NEUROLOGIC: Obtunded, totally unresponsive, cannot make further assessment. SKIN: Intact,warm,dry.  Assessment/Plan:  Synopsis: 79 yo female with 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 presenting with AMS and Acute hypoxic & hypercarbic respiratory failure  requiring mechanical ventilation admitted to the ICU. Noncompliant with home nocturnal vent for months, signs and status post gastrostomy tube.  Acute Hypoxic & Hypercapnic Respiratory Failure  Hypercapnia due to severe muscle weakness Recurrent issue Previously treated with nocturnal vent Patient noncompliant with vent Will be a recurrent issue unless patient does at least nocturnal ventilation If patient does not wish home vent need to address realistic goals of care She has been placed on the ventilator again Vent bundle/VAP bundle activated Bronchodilators for mucociliary clearance Patient was on both scopolamine and Mucomyst will discontinue Chest x-ray and ABG as needed Daily wake-up assessment, SBT/trach collar as tolerated Currently obtunded but will utilize Precedex/fentanyl if needed for ventilator comfort  Acute encephalopathy due to hypercapnea Patient has multiple previous admissions with altered mental status Hypercapnia previously managed with nighttime vent maintain ventilator support overnight hold  sedating medications hepatic function normal, TSH normal 01/16/20  Hypotension  Present after initiation of positive pressure ventilation Evidence of dehydration on clinical exam Responding to LR boluses Will maintain LR infusion Pressors if needed    LOS: 8 days   Additional comments: Discussed with Dr. Roosevelt Locks PCCM will assume care while patient in the ICU.  Patient needs at least nocturnal ventilation if she is to survive.  Need to reassess goals of care with Palliative Care who is already consulted on the case.  Critical Care Total Time*: 1 Hour  Vernard Gambles 01/31/2020  *Care during the described time interval was provided by me and/or other providers on the critical care team.  I have reviewed this patient's available data, including medical history, events of note, physical examination and test results as part of my evaluation.  **This note was  dictated using voice recognition software/Dragon.  Despite best efforts to proofread, errors can occur which can change the meaning.  Any change was purely unintentional.

## 2020-01-31 NOTE — Progress Notes (Signed)
Pt received as a rapid response, report received from [place] at the bedside. 1 liter of LR bolus given, placed on ventilator, foley placed, tolerating cares well.  Husband arrived at the bedside, all questions and concerns addressed.

## 2020-01-31 NOTE — Plan of Care (Signed)
Continuing with plan of care. 

## 2020-01-31 NOTE — Progress Notes (Signed)
Angela David was lavaged and bagged with an Ambu bag to remove a mucus plug.  Oxygen saturation increased to 92% after this and O2 level was increased to 35% on the ATC.

## 2020-02-01 ENCOUNTER — Inpatient Hospital Stay: Payer: Medicare Other

## 2020-02-01 DIAGNOSIS — G9341 Metabolic encephalopathy: Secondary | ICD-10-CM | POA: Diagnosis not present

## 2020-02-01 DIAGNOSIS — J9601 Acute respiratory failure with hypoxia: Secondary | ICD-10-CM | POA: Diagnosis not present

## 2020-02-01 LAB — CBC WITH DIFFERENTIAL/PLATELET
Abs Immature Granulocytes: 0.07 10*3/uL (ref 0.00–0.07)
Basophils Absolute: 0 10*3/uL (ref 0.0–0.1)
Basophils Relative: 0 %
Eosinophils Absolute: 0.1 10*3/uL (ref 0.0–0.5)
Eosinophils Relative: 2 %
HCT: 28.8 % — ABNORMAL LOW (ref 36.0–46.0)
Hemoglobin: 8.7 g/dL — ABNORMAL LOW (ref 12.0–15.0)
Immature Granulocytes: 1 %
Lymphocytes Relative: 16 %
Lymphs Abs: 1.3 10*3/uL (ref 0.7–4.0)
MCH: 26 pg (ref 26.0–34.0)
MCHC: 30.2 g/dL (ref 30.0–36.0)
MCV: 86.2 fL (ref 80.0–100.0)
Monocytes Absolute: 0.7 10*3/uL (ref 0.1–1.0)
Monocytes Relative: 9 %
Neutro Abs: 6.2 10*3/uL (ref 1.7–7.7)
Neutrophils Relative %: 72 %
Platelets: 154 10*3/uL (ref 150–400)
RBC: 3.34 MIL/uL — ABNORMAL LOW (ref 3.87–5.11)
RDW: 13.7 % (ref 11.5–15.5)
WBC: 8.4 10*3/uL (ref 4.0–10.5)
nRBC: 0 % (ref 0.0–0.2)

## 2020-02-01 LAB — GLUCOSE, CAPILLARY
Glucose-Capillary: 110 mg/dL — ABNORMAL HIGH (ref 70–99)
Glucose-Capillary: 143 mg/dL — ABNORMAL HIGH (ref 70–99)
Glucose-Capillary: 158 mg/dL — ABNORMAL HIGH (ref 70–99)
Glucose-Capillary: 160 mg/dL — ABNORMAL HIGH (ref 70–99)
Glucose-Capillary: 164 mg/dL — ABNORMAL HIGH (ref 70–99)
Glucose-Capillary: 172 mg/dL — ABNORMAL HIGH (ref 70–99)
Glucose-Capillary: 174 mg/dL — ABNORMAL HIGH (ref 70–99)
Glucose-Capillary: 202 mg/dL — ABNORMAL HIGH (ref 70–99)

## 2020-02-01 LAB — BASIC METABOLIC PANEL
Anion gap: 7 (ref 5–15)
BUN: 14 mg/dL (ref 8–23)
CO2: 29 mmol/L (ref 22–32)
Calcium: 8.1 mg/dL — ABNORMAL LOW (ref 8.9–10.3)
Chloride: 101 mmol/L (ref 98–111)
Creatinine, Ser: 0.69 mg/dL (ref 0.44–1.00)
GFR, Estimated: >60 mL/min (ref >60)
Glucose, Bld: 203 mg/dL — ABNORMAL HIGH (ref 70–99)
Potassium: 4.6 mmol/L (ref 3.5–5.1)
Sodium: 137 mmol/L (ref 135–145)

## 2020-02-01 LAB — MAGNESIUM: Magnesium: 1.9 mg/dL (ref 1.7–2.4)

## 2020-02-01 LAB — PHOSPHORUS: Phosphorus: 1.8 mg/dL — ABNORMAL LOW (ref 2.5–4.6)

## 2020-02-01 MED ORDER — SODIUM CHLORIDE 0.9 % IV SOLN
250.0000 mL | INTRAVENOUS | Status: DC
  Administered 2020-02-01: 250 mL via INTRAVENOUS

## 2020-02-01 NOTE — Procedures (Addendum)
  Informed by RT unable to obtain ABG, difficult stick.   I obtained arterial blood in the left radial using ultrasound guidance. ABG looks good but alkalotic now and over oxygenating.   Discussed with RT to reduce minute ventilation (currently on tidal volume of 450), and to wean FiO2 to maintain sats > 92%.  ___________________ Angela David. Reinaldo Berber, MD [facility]

## 2020-02-01 NOTE — Progress Notes (Signed)
Follow up - Critical Care Medicine Note  Patient Details:    Angela David is an 79 y.o. 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  Patient was transferred to the intensive care unit 12/5 after rapid response was called on the floor and the patient was noted to be obtunded and unresponsive.  She had had mucous plugging issues during the day.  Patient placed back on the ventilator.  Initially admitted on 11/27.  PCCM asked to assume care.  Lines, Airways, Drains: Gastrostomy/Enterostomy Gastrostomy 16 Fr. LUQ (Active)  Surrounding Skin Dry;Intact 01/31/20 0813  Tube Status Other (Comment) 01/31/20 0813  Drainage Appearance None 01/31/20 0813  Dressing Status Clean;Dry;Intact 01/31/20 0813  Dressing Intervention Dressing reinforced 01/31/20 0813  Dressing Type Split gauze 01/31/20 0813     Urethral Catheter Olen Pel NT Non-latex 14 Fr. (Active)  Indication for Insertion or Continuance of Catheter Unstable critically ill patients first 24-48 hours (See Criteria) 01/31/20 2000  Site Assessment Clean;Intact 01/31/20 2000  Catheter Maintenance Bag below level of bladder;Catheter secured;Drainage bag/tubing not touching floor;Insertion date on drainage bag;No dependent loops;Seal intact 01/31/20 2000  Collection Container Standard drainage bag 01/31/20 2000  Securement Method Securing device (Describe) 01/31/20 2000  Urinary Catheter Interventions (if applicable) Unclamped 37/10/62 2000  Output (mL) 1100 mL 01/31/20 1736    Anti-infectives:  Anti-infectives (From admission, onward)   Start     Dose/Rate Route Frequency Ordered Stop   01/29/20 0600  cefoTEtan (CEFOTAN) 2 g in sodium chloride 0.9 % 100 mL IVPB       Note to Pharmacy: on call to OR, please do not give on the floor   2 g 200 mL/hr over 30 Minutes Intravenous On call to O.R. 01/28/20 1032 01/29/20 1111   01/24/20 1000   ceFEPIme (MAXIPIME) 2 g in sodium chloride 0.9 % 100 mL IVPB  Status:  Discontinued        2 g 200 mL/hr over 30 Minutes Intravenous Every 12 hours 01/23/20 2219 01/24/20 0402   01/24/20 1000  vancomycin (VANCOREADY) IVPB 750 mg/150 mL  Status:  Discontinued        750 mg 150 mL/hr over 60 Minutes Intravenous Every 12 hours 01/23/20 2241 01/24/20 0402   01/23/20 2230  vancomycin (VANCOREADY) IVPB 1750 mg/350 mL        1,750 mg 175 mL/hr over 120 Minutes Intravenous  Once 01/23/20 2220 01/24/20 0045   01/23/20 2130  vancomycin (VANCOCIN) IVPB 1000 mg/200 mL premix  Status:  Discontinued        1,000 mg 200 mL/hr over 60 Minutes Intravenous  Once 01/23/20 2119 01/23/20 2220   01/23/20 2130  ceFEPIme (MAXIPIME) 2 g in sodium chloride 0.9 % 100 mL IVPB        2 g 200 mL/hr over 30 Minutes Intravenous  Once 01/23/20 2119 01/23/20 2211     Scheduled Meds: . albuterol  2.5 mg Nebulization BID  . allopurinol  100 mg Per NG tube Daily  . busPIRone  30 mg Per NG tube BID  . chlorhexidine gluconate (MEDLINE KIT)  15 mL Mouth Rinse BID  . Chlorhexidine Gluconate Cloth  6 each Topical Daily  . enoxaparin (LOVENOX) injection  40 mg Subcutaneous Q24H  . escitalopram  30 mg Per Tube Daily  . feeding supplement (PROSource TF)  45 mL Per Tube Daily  . folic acid  1 mg Per Tube Daily  .  free water  100 mL Per Tube Q6H  . gabapentin  600 mg Per Tube Q8H  . insulin aspart  0-9 Units Subcutaneous Q4H  . insulin glargine  10 Units Subcutaneous QHS  . mouth rinse  15 mL Mouth Rinse 10 times per day  . melatonin  5 mg Per Tube QHS  . multivitamin with minerals  1 tablet Per Tube Daily  . pantoprazole (PROTONIX) IV  40 mg Intravenous QHS  . pravastatin  20 mg Per NG tube q1800  . rOPINIRole  0.5 mg Per Tube QHS   Continuous Infusions: . sodium chloride Stopped (02/01/20 0110)  . sodium chloride 10 mL/hr at 02/01/20 1616  . feeding supplement (OSMOLITE 1.5 CAL) 1,000 mL (02/01/20 1106)  . lactated  ringers 75 mL/hr at 02/01/20 1616   PRN Meds:.acetaminophen, acetaminophen, fentaNYL (SUBLIMAZE) injection, ondansetron (ZOFRAN) IV   Microbiology: Results for orders placed or performed during the hospital encounter of 01/23/20  Blood culture (routine single)     Status: None   Collection Time: 01/23/20  8:27 PM   Specimen: BLOOD  Result Value Ref Range Status   Specimen Description BLOOD BLOOD LEFT HAND  Final   Special Requests   Final    BOTTLES DRAWN AEROBIC AND ANAEROBIC Blood Culture adequate volume   Culture   Final    NO GROWTH 5 DAYS Performed at [facility], [address] [place], [place] [address]    Report Status 01/28/2020 FINAL  Final  Resp Panel by RT-PCR (Flu A&B, Covid) Nasopharyngeal Swab     Status: None   Collection Time: 01/23/20  8:27 PM   Specimen: Nasopharyngeal Swab; Nasopharyngeal(NP) swabs in vial transport medium  Result Value Ref Range Status   SARS Coronavirus 2 by RT PCR NEGATIVE NEGATIVE Final    Comment: (NOTE) SARS-CoV-2 target nucleic acids are NOT DETECTED.  The SARS-CoV-2 RNA is generally detectable in upper respiratory specimens during the acute phase of infection. The lowest concentration of SARS-CoV-2 viral copies this assay can detect is 138 copies/mL. A negative result does not preclude SARS-Cov-2 infection and should not be used as the sole basis for treatment or other patient management decisions. A negative result may occur with  improper specimen collection/handling, submission of specimen other than nasopharyngeal swab, presence of viral mutation(s) within the areas targeted by this assay, and inadequate number of viral copies(<138 copies/mL). A negative result must be combined with clinical observations, patient history, and epidemiological information. The expected result is Negative.  Fact Sheet for Patients:  EntrepreneurPulse.com.au  Fact Sheet for Healthcare Providers:   IncredibleEmployment.be  This test is no t yet approved or cleared by the [place] FDA and  has been authorized for detection and/or diagnosis of SARS-CoV-2 by FDA under an Emergency Use Authorization (EUA). This EUA will remain  in effect (meaning this test can be used) for the duration of the COVID-19 declaration under Section 564(b)(1) of the Act, 21 U.S.C.section 360bbb-3(b)(1), unless the authorization is terminated  or revoked sooner.       Influenza A by PCR NEGATIVE NEGATIVE Final   Influenza B by PCR NEGATIVE NEGATIVE Final    Comment: (NOTE) The Xpert Xpress SARS-CoV-2/FLU/RSV plus assay is intended as an aid in the diagnosis of influenza from Nasopharyngeal swab specimens and should not be used as a sole basis for treatment. Nasal washings and aspirates are unacceptable for Xpert Xpress SARS-CoV-2/FLU/RSV testing.  Fact Sheet for Patients: EntrepreneurPulse.com.au  Fact Sheet for Healthcare Providers: IncredibleEmployment.be  This  test is not yet approved or cleared by the [place] and has been authorized for detection and/or diagnosis of SARS-CoV-2 by FDA under an Emergency Use Authorization (EUA). This EUA will remain in effect (meaning this test can be used) for the duration of the COVID-19 declaration under Section 564(b)(1) of the Act, 21 U.S.C. section 360bbb-3(b)(1), unless the authorization is terminated or revoked.  Performed at [facility], [address] [place], [place] [address]   Urine culture     Status: None   Collection Time: 01/23/20  9:11 PM   Specimen: In/Out Cath Urine  Result Value Ref Range Status   Specimen Description   Final    IN/OUT CATH URINE Performed at [facility], [address] [place], [place] [address]    Special Requests   Final    NONE Performed at [facility], [address] [place], [place] [address]    Culture    Final    NO GROWTH Performed at [facility], [place] [address] [place], [place] [address]    Report Status 01/25/2020 FINAL  Final  Culture, blood (single)     Status: None   Collection Time: 01/23/20 10:05 PM   Specimen: BLOOD  Result Value Ref Range Status   Specimen Description BLOOD BLOOD LEFT FOREARM  Final   Special Requests   Final    BOTTLES DRAWN AEROBIC ONLY Blood Culture results may not be optimal due to an inadequate volume of blood received in culture bottles   Culture   Final    NO GROWTH 5 DAYS Performed at [facility], [address] [place], [place] [address]    Report Status 01/28/2020 FINAL  Final  MRSA PCR Screening     Status: None   Collection Time: 01/24/20  1:00 AM   Specimen: Nasopharyngeal  Result Value Ref Range Status   MRSA by PCR NEGATIVE NEGATIVE Final    Comment:        The GeneXpert MRSA Assay (FDA approved for NASAL specimens only), is one component of a comprehensive MRSA colonization surveillance program. It is not intended to diagnose MRSA infection nor to guide or monitor treatment for MRSA infections. Performed at [facility], [address] [place], [place] [address]   Culture, respiratory (non-expectorated)     Status: None   Collection Time: 01/24/20 11:58 AM   Specimen: SPU; Respiratory  Result Value Ref Range Status   Specimen Description   Final    SPUTUM Performed at [facility], [address] [place], [place] [address]    Special Requests   Final    NONE Performed at [facility], [place], [place] [address]    Gram Stain NO WBC SEEN NO ORGANISMS SEEN   Final   Culture   Final    RARE Normal respiratory flora-no Staph aureus or Pseudomonas seen Performed at [facility], [place] [address], [place], [place] [address]    Report Status 01/26/2020 FINAL  Final    Best Practice/Protocols:  VTE Prophylaxis: Lovenox (prophylaxtic dose) GI Prophylaxis:  Proton Pump Inhibitor Continous Sedation Hyperglycemia (ICU) Vent/VAP  Events: 01/23/20 AMS in ED, hypercarbic- placed on ventilator > Admit to ICU 11/28 remains on vent, critically ill 11/30- Patient for IR guided Dobhoff due to NPO at this time. Plan for PEG 01/30/20 01/27/20-Patient is off ventilator on trache collar.  Optimizing for downgrade to medical floor 01/29/2020: Gastrostomy placement by surgery 01/31/2020:  Readmitted to ICU after rapid response patient found unresponsive with hypercarbia and hypoxia, back on ventilator  CC  Follow up resp failure  HPI Needs vent to survive   Studies: DG Chest 2 View  Result Date: 01/15/2020 CLINICAL DATA:  Change in mental status EXAM: CHEST - 2 VIEW COMPARISON:  Jul 20, 2019 FINDINGS: The heart size and mediastinal contours are within normal limits. Tracheostomy tube is seen at the level of the clavicular heads. Again noted is elevation of the right hemidiaphragm. There is blunting of the left costophrenic angle which be due to combination of effusion and airspace opacity. IMPRESSION: Left pleural effusion with adjacent patchy airspace opacity which could be due to atelectasis and/or infectious etiology. Electronically Signed   By: Prudencio Pair M.D.   On: 01/15/2020 16:08   DG Shoulder Right  Result Date: 01/07/2020 CLINICAL DATA:  [place] onto right shoulder EXAM: RIGHT SHOULDER - 2+ VIEW COMPARISON:  11/22/2010 FINDINGS: Frontal and transscapular views of the right shoulder are obtained. Bones are osteopenic. There is a comminuted intra-articular fracture distal margin right clavicle, with minimal distraction. No other acute bony abnormalities. Hypertrophic changes are noted of the acromioclavicular joint. Right chest is clear. IMPRESSION: 1. Comminuted intra-articular fracture distal margin right clavicle, with minimal separation of the fracture fragment. Electronically Signed   By: Randa Ngo M.D.   On: 01/07/2020 18:43   DG Tibia/Fibula  Left  Result Date: 01/16/2020 CLINICAL DATA:  Initial evaluation for bilateral lower leg pain. EXAM: LEFT TIBIA AND FIBULA - 2 VIEW COMPARISON:  None. FINDINGS: Sequelae of prior ORIF at the tibial plateau/proximal tibia with lateral malleable plate screw fixation in place. No periprosthetic lucency to suggest loosening or failure. No acute fracture dislocation. No discrete osseous lesions. Osteoarthritic changes noted about the partially visualized knee. Posterior calcaneal enthesophyte noted. No visible soft tissue abnormality. IMPRESSION: 1. Sequelae of prior ORIF at the left tibial plateau/proximal tibia. No hardware complication. 2. No acute osseous abnormality about the left tibia/fibula. Electronically Signed   By: Jeannine Boga M.D.   On: 01/16/2020 04:00   DG Tibia/Fibula Right  Result Date: 01/16/2020 CLINICAL DATA:  Initial evaluation for bilateral lower leg pain. EXAM: RIGHT TIBIA AND FIBULA - 2 VIEW COMPARISON:  None. FINDINGS: No acute fracture or dislocation. Sclerotic focus at the proximal tibia noted, nonspecific, but most likely benign. Posterior calcaneal enthesophyte. No visible soft tissue abnormality. IMPRESSION: No acute osseous abnormality about the right tibia/fibula. No visible soft tissue abnormality. Electronically Signed   By: Jeannine Boga M.D.   On: 01/16/2020 04:03   DG Abd 1 View  Result Date: 01/24/2020 CLINICAL DATA:  OG tube placement EXAM: ABDOMEN - 1 VIEW COMPARISON:  11/19/2010 FINDINGS: The OG tube projects over the gastric body. The bowel gas pattern is nonobstructive. IMPRESSION: OG tube projects over the gastric body. Electronically Signed   By: Constance Holster M.D.   On: 01/24/2020 01:12   CT Head Wo Contrast  Result Date: 01/23/2020 CLINICAL DATA:  Altered mental status. EXAM: CT HEAD WITHOUT CONTRAST TECHNIQUE: Contiguous axial images were obtained from the base of the skull through the vertex without intravenous contrast. COMPARISON:   January 15, 2020 FINDINGS: Brain: There is mild cerebral atrophy with widening of the extra-axial spaces and ventricular dilatation. There are areas of decreased attenuation within the white matter tracts of the supratentorial brain, consistent with microvascular disease changes. Vascular: No hyperdense vessel or unexpected calcification. Skull: Normal. Negative for fracture or focal lesion. Sinuses/Orbits: No acute  finding. Other: None. IMPRESSION: 1. Generalized cerebral atrophy. 2. No acute intracranial abnormality. Electronically Signed   By: Virgina Norfolk M.D.   On: 01/23/2020 21:05   CT Head Wo Contrast  Result Date: 01/15/2020 CLINICAL DATA:  Dizziness, nonspecific. Mental status change, unknown cause. Additional history provided: Hallucinations for 1 week. EXAM: CT HEAD WITHOUT CONTRAST TECHNIQUE: Contiguous axial images were obtained from the base of the skull through the vertex without intravenous contrast. COMPARISON:  Head CT 01/07/2020.  Brain MRI 03/21/2019. FINDINGS: Brain: Mild generalized cerebral atrophy. Advanced ill-defined hypoattenuation within the cerebral white matter is nonspecific, but compatible with chronic small vessel ischemic disease. There is no acute intracranial hemorrhage. No demarcated cortical infarct. No extra-axial fluid collection. No evidence of intracranial mass. No midline shift. Vascular: No hyperdense vessel.  Atherosclerotic calcifications. Skull: Normal. Negative for fracture or focal lesion. Sinuses/Orbits: Visualized orbits show no acute finding. Small volume frothy secretions within the right sphenoid sinus. Tiny right maxillary sinus mucous retention cyst. Trace fluid within the left mastoid air cells. IMPRESSION: No evidence of acute intracranial abnormality. Mild cerebral atrophy with advanced chronic small vessel ischemic disease, stable as compared to the head CT of 01/07/2020. Right sphenoid sinusitis. Electronically Signed   By: Kellie Simmering DO   On:  01/15/2020 16:40   CT Head Wo Contrast  Result Date: 01/07/2020 CLINICAL DATA:  Head trauma, minor. Additional history provided: Unwitnessed mechanical fall hitting head. EXAM: CT HEAD WITHOUT CONTRAST TECHNIQUE: Contiguous axial images were obtained from the base of the skull through the vertex without intravenous contrast. COMPARISON:  Head CT 03/29/2019. FINDINGS: Brain: Mild generalized cerebral atrophy with advanced chronic small vessel ischemic disease, stable as compared to the prior head CT of 03/29/2019. There is no acute intracranial hemorrhage. No demarcated cortical infarct. No extra-axial fluid collection. No evidence of intracranial mass. No midline shift. Vascular: No hyperdense vessel.  Atherosclerotic calcifications Skull: Normal. Negative for fracture or focal lesion. Sinuses/Orbits: Visualized orbits show no acute finding. No significant paranasal sinus disease at the imaged levels. IMPRESSION: No evidence of acute intracranial abnormality. Mild cerebral atrophy with advanced chronic small vessel ischemic disease, stable as compared to the head CT of 03/29/2019 Electronically Signed   By: Kellie Simmering DO   On: 01/07/2020 18:38   CT ANGIO CHEST PE W OR WO CONTRAST  Result Date: 01/17/2020 CLINICAL DATA:  Suspected pulmonary embolus with low to intermediate probability. Positive D-dimer. Altered mental status. EXAM: CT ANGIOGRAPHY CHEST WITH CONTRAST TECHNIQUE: Multidetector CT imaging of the chest was performed using the standard protocol during bolus administration of intravenous contrast. Multiplanar CT image reconstructions and MIPs were obtained to evaluate the vascular anatomy. CONTRAST:  12m OMNIPAQUE IOHEXOL 350 MG/ML SOLN COMPARISON:  03/26/2019 FINDINGS: Cardiovascular: There is good opacification of the central and segmental pulmonary arteries. No focal filling defects. No evidence of significant pulmonary embolus. Cardiac enlargement. No pericardial effusions. Coronary artery  and aortic calcifications. Normal caliber thoracic aorta. No aortic dissection. Great vessel origins are patent. Mediastinum/Nodes: The esophagus is diffusely dilated and contains air and fluid. This may represent dysmotility or possibly outlet obstruction. Similar finding on previous study suggest chronic process. No significant lymphadenopathy. A tracheostomy tube is present. Lungs/Pleura: Fibrosis and scarring in the lung apices, greater on the right. Mild peripheral interstitial pattern likely also represents fibrosis. There are multiple subcentimeter nodules demonstrated throughout the right lung. These were also present on the previous study. Focal consolidation or atelectasis in the left lung base. Small patchy infiltrative  areas demonstrated in the left lingula. Changes may represent inflammatory process or aspiration. No pleural effusions. No pneumothorax. Upper Abdomen: Surgical absence of the gallbladder. No acute process demonstrated in the visualized upper abdomen. Musculoskeletal: Degenerative changes in the spine. Old deformity of the sternum, likely posttraumatic. No destructive bone lesions or acute changes identified. Review of the MIP images confirms the above findings. IMPRESSION: 1. No evidence of significant pulmonary embolus. 2. Focal consolidation or atelectasis in the left lung base. Small patchy infiltrative areas demonstrated in the left lingula. Changes may represent inflammatory process or aspiration. 3. Fibrosis and scarring in the lung apices, greater on the right. 4. Multiple subcentimeter nodules throughout the right lung. These were also present on the previous study. 5. Cardiac enlargement. 6. Dilated esophagus containing air and fluid. This may represent dysmotility or possibly outlet obstruction. Similar finding on previous study. 7. Tracheostomy tube. 8. Aortic atherosclerosis. Aortic Atherosclerosis (ICD10-I70.0). Electronically Signed   By: Lucienne Capers M.D.   On:  01/17/2020 23:42   MR BRAIN WO CONTRAST  Result Date: 01/16/2020 CLINICAL DATA:  Mental status change EXAM: MRI HEAD WITHOUT CONTRAST TECHNIQUE: Multiplanar, multiecho pulse sequences of the brain and surrounding structures were obtained without intravenous contrast. COMPARISON:  03/21/2019 FINDINGS: Motion artifact is present. Brain: There is no acute infarction or intracranial hemorrhage. There is no intracranial mass, mass effect, or edema. There is no hydrocephalus or extra-axial fluid collection. Prominence of the ventricles and sulci reflects generalized parenchymal volume loss. Patchy and confluent areas of T2 hyperintensity in the supratentorial greater than pontine white matter are nonspecific but probably reflect advanced chronic microvascular ischemic changes. Vascular: Major vessel flow voids at the skull base are preserved. Skull and upper cervical spine: Normal marrow signal is preserved. Sinuses/Orbits: Paranasal sinuses are aerated. Orbits are unremarkable. Other: Sella is unremarkable.  Mastoid air cells are clear. IMPRESSION: No acute infarction, hemorrhage, or mass. Advanced chronic microvascular ischemic changes. Electronically Signed   By: Macy Mis M.D.   On: 01/16/2020 20:02   US Venous Img Lower Bilateral (DVT)  Result Date: 01/16/2020 CLINICAL DATA:  Initial evaluation for lower extremity swelling. EXAM: BILATERAL LOWER EXTREMITY VENOUS DOPPLER ULTRASOUND TECHNIQUE: Gray-scale sonography with graded compression, as well as color Doppler and duplex ultrasound were performed to evaluate the lower extremity deep venous systems from the level of the common femoral vein and including the common femoral, femoral, profunda femoral, popliteal and calf veins including the posterior tibial, peroneal and gastrocnemius veins when visible. The superficial great saphenous vein was also interrogated. Spectral Doppler was utilized to evaluate flow at rest and with distal augmentation  maneuvers in the common femoral, femoral and popliteal veins. COMPARISON:  None. FINDINGS: RIGHT LOWER EXTREMITY Common Femoral Vein: No evidence of thrombus. Normal compressibility, respiratory phasicity and response to augmentation. Saphenofemoral Junction: No evidence of thrombus. Normal compressibility and flow on color Doppler imaging. Profunda Femoral Vein: No evidence of thrombus. Normal compressibility and flow on color Doppler imaging. Femoral Vein: No evidence of thrombus. Normal compressibility, respiratory phasicity and response to augmentation. Popliteal Vein: No evidence of thrombus. Normal compressibility, respiratory phasicity and response to augmentation. Calf Veins: No evidence of thrombus. Normal compressibility and flow on color Doppler imaging. Superficial Great Saphenous Vein: No evidence of thrombus. Normal compressibility. Venous Reflux:  None. Other Findings:  None. LEFT LOWER EXTREMITY Common Femoral Vein: No evidence of thrombus. Normal compressibility, respiratory phasicity and response to augmentation. Saphenofemoral Junction: No evidence of thrombus. Normal compressibility and flow on color Doppler  imaging. Profunda Femoral Vein: No evidence of thrombus. Normal compressibility and flow on color Doppler imaging. Femoral Vein: No evidence of thrombus. Normal compressibility, respiratory phasicity and response to augmentation. Popliteal Vein: No evidence of thrombus. Normal compressibility, respiratory phasicity and response to augmentation. Calf Veins: No evidence of thrombus. Normal compressibility and flow on color Doppler imaging. Superficial Great Saphenous Vein: No evidence of thrombus. Normal compressibility. Venous Reflux:  None. Other Findings:  None. IMPRESSION: No evidence of deep venous thrombosis in either lower extremity. Electronically Signed   By: Jeannine Boga M.D.   On: 01/16/2020 01:32   Portable Chest xray  Result Date: 02/01/2020 CLINICAL DATA:  Respiratory  failure. EXAM: PORTABLE CHEST 1 VIEW COMPARISON:  01/31/2020 FINDINGS: The tracheostomy tube is in good position, unchanged. The cardiac silhouette, mediastinal hilar contours are within normal limits and stable. Chronic bronchitic type changes and streaky areas of atelectasis but no focal infiltrates or effusions. The bony thorax is intact. IMPRESSION: 1. Stable tracheostomy tube. 2. Chronic interstitial changes and streaky areas of atelectasis. No new infiltrates or edema. Electronically Signed   By: Marijo Sanes M.D.   On: 02/01/2020 07:36   DG Chest Port 1 View  Result Date: 01/31/2020 CLINICAL DATA:  Tachycardia EXAM: PORTABLE CHEST 1 VIEW COMPARISON:  01/25/2020 FINDINGS: Mild interstitial opacity, unchanged. Tracheostomy tube tip is at the level of the clavicular heads. No pneumothorax or sizable pleural effusion. IMPRESSION: Unchanged mild interstitial opacity. Electronically Signed   By: Ulyses Jarred M.D.   On: 01/31/2020 05:39   DG Chest Port 1 View  Result Date: 01/25/2020 CLINICAL DATA:  Altered mental status, shortness of breath EXAM: PORTABLE CHEST 1 VIEW COMPARISON:  01/23/2020 FINDINGS: Tracheostomy device is present. Enteric tube passes into the stomach with tip out of field of view. Patient is rotated. Stable elevation of the right hemidiaphragm. Likely chronic mild interstitial prominence. Persistent patchy density at the left lung base no significant pleural effusion. No pneumothorax. Stable cardiomediastinal contours. IMPRESSION: Persistent patchy atelectasis/consolidation at the left lung base. Electronically Signed   By: Macy Mis M.D.   On: 01/25/2020 08:31   DG Chest Port 1 View  Result Date: 01/23/2020 CLINICAL DATA:  Sepsis. EXAM: PORTABLE CHEST 1 VIEW COMPARISON:  January 18, 2020 FINDINGS: The tracheostomy tube is stable in positioning. The heart size is unchanged. Hazy coarse lung markings are again noted. Areas of fibrosis and scarring are noted at the lung  apices. There is elevation of the right hemidiaphragm. Aortic calcifications are noted. IMPRESSION: No significant interval change. Electronically Signed   By: Constance Holster M.D.   On: 01/23/2020 20:53   DG Chest Port 1 View  Result Date: 01/18/2020 CLINICAL DATA:  Cough. EXAM: PORTABLE CHEST 1 VIEW COMPARISON:  Chest radiographs 01/15/2020 and CTA 01/17/2020 FINDINGS: The patient is rotated to the left. Tracheostomy tube overlies the airway. The cardiomediastinal silhouette is unchanged with normal heart size. There is persistent elevation of the right hemidiaphragm. Patchy left basilar airspace opacity has decreased. No sizable pleural effusion or pneumothorax is identified. Old rib fractures are noted. IMPRESSION: Improved left basilar aeration. Electronically Signed   By: Logan Bores M.D.   On: 01/18/2020 08:11   DG Abd Portable 1V  Result Date: 01/26/2020 CLINICAL DATA:  NG tube placement EXAM: PORTABLE ABDOMEN - 1 VIEW COMPARISON:  01/24/2020 abdominal radiographs and prior. FINDINGS: Non weighted enteric tube tip and side hole project over the gastric body. Gas is seen within nondilated abdominal bowel loops. Right hemiabdomen surgical  clips. Multilevel spondylosis. IMPRESSION: Non weighted enteric tube tip and side hole project over the gastric body. Electronically Signed   By: Primitivo Gauze M.D.   On: 01/26/2020 08:28   DG Swallowing Func-Speech Pathology  Result Date: 01/19/2020 Objective Swallowing Evaluation: Type of Study: MBS-Modified Barium Swallow Study  Patient Details Name: Angela David MRN: [id number] Date of Birth: October 17, 1940 Today's Date: 01/19/2020 Time: SLP Start Time (ACUTE ONLY): 1420 -SLP Stop Time (ACUTE ONLY): 1445 SLP Time Calculation (min) (ACUTE ONLY): 25 min Past Medical History: Past Medical History: Diagnosis Date . Breast cancer ([place]) 2002  right breast chemo and radiation . Cancer [facility]) 2002  right breast . Diabetes mellitus without complication  ([place])  . Diabetic peripheral neuropathy ([place]) 08/28/2016 . GERD (gastroesophageal reflux disease)  . Glaucoma  . High cholesterol  . History of colon polyps  . Peripheral neuropathy 08/28/2016 . Personal history of chemotherapy 2002  BREAST CA . Personal history of malignant neoplasm of breast  . Personal history of radiation therapy 2002  BREAST CA . Vocal cord paralysis 2012 Past Surgical History: Past Surgical History: Procedure Laterality Date . ABDOMINAL HYSTERECTOMY   . BREAST BIOPSY Right 2008  neg . BREAST EXCISIONAL BIOPSY Right 2002  positive . BREAST LUMPECTOMY Right 2002  chemo and radiation . BREAST SURGERY Right 2002  LUMPECTOMY, radiation, chemotherapy . CHOLECYSTECTOMY N/A 12/10/2017  Procedure: LAPAROSCOPIC CHOLECYSTECTOMY;  Surgeon: Benjamine Sprague, DO;  Location: [facility] ORS;  Service: General;  Laterality: N/A; . COLON RESECTION Right 11/06/2018  Procedure: HAND ASSISTED LAPAROSCOPIC COLON RESECTION, RIGHT;  Surgeon: Jules Husbands, MD;  Location: [facility] ORS;  Service: General;  Laterality: Right; . COLONOSCOPY  2009,12/30/2013 . COLONOSCOPY WITH PROPOFOL N/A 04/16/2018  Tubulovillous polyp proximal transverse colon, large sessile polyp, proximal transverse colon;  Surgeon: Robert Bellow, MD;  Location: [facility] ENDOSCOPY;  Service: Endoscopy;  Laterality: N/A; . ESOPHAGOGASTRODUODENOSCOPY (EGD) WITH PROPOFOL N/A 10/14/2018  Procedure: ESOPHAGOGASTRODUODENOSCOPY (EGD) WITH PROPOFOL;  Surgeon: Lucilla Lame, MD;  Location: [facility] ENDOSCOPY;  Service: Endoscopy;  Laterality: N/A; . HYDROCELE EXCISION / REPAIR   . TRACHEAL SURGERY  2013 . TRACHEOSTOMY N/A   Dr Kathyrn Sheriff HPI: Chanelle Hodsdon is a 79 y.o. female with medical history significant for hypertension, diabetes, history of trachea stenosis status post tracheostomy who presented to the ED on 01/15/20 with mental status changes with disorientation, not recognizing family, generalized weakness for several days.  Pt has been chronically trach dependent since  2013 and is followed by [facility] and Anda Latina (ENT, [facility]). Pertinant information: May 31, 2019: MBSS at [facility] - "Pt presents with a mild oral and mild to moderate pharyngeal dysphagia c/b reduced oral manipulation, delayed swallow initiation, reduced BOT retraction and reduced pharyngeal constriction. These led to increased oral transit time, vallecula residue (mild - thin liquids, moderate - solids), pyriform sinus residue (mild - thin liquids, moderate - solids and trace penetration of solids. Penetration cleared with swallows. Residue significantly reduced with liquid rinse and subsequent swallows. Cervical esophagus: accumulation of solids within the UES/cervical esophagus essentially cleared with liquid rinse and subsequent swallows." June 24, 2019: The patient underwent MDL with excision of suprastomal granulation tissue and tracheal balloon dilation, and esophagoscopy with UES balloon dilation. Currently pt has a fenestrated #6 [place] with inability to tolerate inner cannula and PMSV but per her husband she can achieve phonation with finger occulsion. Per husband's report, she has difficulty swallowing with it varying between moderate to severe.  Subjective: pt pleasant, alert, attentive to  study Assessment / Plan / Recommendation CHL IP CLINICAL IMPRESSIONS 01/19/2020 Clinical Impression Imaging is difficult d/t pt's poor position. Despite having pillows and support to sit upright pt leaned to her right with her head laying to her left. Pt was positioned in an oblique position for best imagaing. Pt doesn't occlud her trach when eating. Trials were given with trach unoccluded and with SLP finger occluding trach hub. No change is oropharyngeal abilities were observed. Pt has a very complex tracheal and esophageal history. This Modified Barium Swallow was provided to assess for aspiration in an effort to return pt to an oral diet. Per attending report, pt's otolaryngologist doesn't have  any further recommendations for trach management and her current ENT requested her trach to be left "as it is." Pt's HR continues to be elevated at ~ 100 but MD granted permission to have swallow study and pt's nurse was present during the study for close monitoring. Pt on 5L trach collar. Aspiration was not observed during the study but again clear visualization was difficult to achieve d/t pt positioning. Pt is not able to perform any compensatory swallow strategies.  Pt presents with severe sensori-motor oropharyngeal dysphagia. Pt's oral phase is c/b decreased lingual manipulation of puree, mechanical soft, thin liquids and nectar thick liquids. Pt utilizes lingual pumping to help with posterior propulsion of boluses. Pt has premature spillage of all boluses into her pyriform sinuses and piecemeal swallowing. Her pharyngeal phase is characterized by extremely delayed swallow initiation, decreased pharyneal squeeze and limited amounts of bolus transiting through the UES with each swallow. As a result, > 90% of each bolus residues in the pharynx that is only cleared thru the UES after 5 to 6 swallows. At baseline, pt consumes her medicine whole in puree. During this study pt cleared the puree thru her UES but the barium tablet resides in the cervical esophagus and required liquid wash and multiple swallows to clear. When completing multiple swallows per bolus, pt's HR climbed to 116 and to 118. Pt is at an extremely high risk of aspirating all PO intake as well as having a cardiac response to multiple swallows. A conservative diet would be dysphagia 1 with thin liquids via cup, medicine crushed in puree. Also recommend a consult for Palliative Care to help with GOC.  SLP Visit Diagnosis Dysphagia, oropharyngeal phase (R13.12);Dysphagia, pharyngoesophageal phase (R13.14) Attention and concentration deficit following -- Frontal lobe and executive function deficit following -- Impact on safety and function Severe  aspiration risk;Risk for inadequate nutrition/hydration   CHL IP TREATMENT RECOMMENDATION 01/19/2020 Treatment Recommendations No treatment recommended at this time   Prognosis 01/19/2020 Prognosis for Safe Diet Advancement Guarded Barriers to Reach Goals Severity of deficits;Time post onset Barriers/Prognosis Comment -- CHL IP DIET RECOMMENDATION 01/19/2020 SLP Diet Recommendations Dysphagia 1 (Puree) solids;Thin liquid Liquid Administration via Cup;No straw Medication Administration Whole meds with puree Compensations Minimize environmental distractions;Slow rate;Small sips/bites Postural Changes Seated upright at 90 degrees   CHL IP OTHER RECOMMENDATIONS 01/19/2020 Recommended Consults (No Data) Oral Care Recommendations Oral care BID Other Recommendations --   CHL IP FOLLOW UP RECOMMENDATIONS 01/19/2020 Follow up Recommendations (No Data)   CHL IP FREQUENCY AND DURATION 01/18/2020 Speech Therapy Frequency (ACUTE ONLY) min 2x/week Treatment Duration 2 weeks      CHL IP ORAL PHASE 01/19/2020 Oral Phase Impaired Oral - Pudding Teaspoon -- Oral - Pudding Cup -- Oral - Honey Teaspoon -- Oral - Honey Cup -- Oral - Nectar Teaspoon Weak lingual manipulation;Lingual pumping;Reduced posterior propulsion;Holding  of bolus;Lingual/palatal residue;Piecemeal swallowing;Delayed oral transit;Decreased bolus cohesion;Premature spillage Oral - Nectar Cup Weak lingual manipulation;Lingual pumping;Reduced posterior propulsion;Holding of bolus;Lingual/palatal residue;Piecemeal swallowing;Delayed oral transit;Decreased bolus cohesion;Premature spillage Oral - Nectar Straw -- Oral - Thin Teaspoon Weak lingual manipulation;Lingual pumping;Reduced posterior propulsion;Holding of bolus;Lingual/palatal residue;Piecemeal swallowing;Delayed oral transit;Decreased bolus cohesion;Premature spillage Oral - Thin Cup Weak lingual manipulation;Lingual pumping;Reduced posterior propulsion;Holding of bolus;Lingual/palatal residue;Piecemeal  swallowing;Delayed oral transit;Decreased bolus cohesion;Premature spillage Oral - Thin Straw -- Oral - Puree Weak lingual manipulation;Lingual pumping;Reduced posterior propulsion;Holding of bolus;Lingual/palatal residue;Piecemeal swallowing;Delayed oral transit;Decreased bolus cohesion;Premature spillage Oral - Mech Soft Impaired mastication;Weak lingual manipulation;Lingual pumping;Reduced posterior propulsion;Holding of bolus;Piecemeal swallowing;Delayed oral transit;Decreased bolus cohesion;Lingual/palatal residue Oral - Regular -- Oral - Multi-Consistency -- Oral - Pill Weak lingual manipulation;Lingual pumping;Reduced posterior propulsion;Holding of bolus;Lingual/palatal residue;Piecemeal swallowing;Delayed oral transit;Decreased bolus cohesion;Premature spillage Oral Phase - Comment --  CHL IP PHARYNGEAL PHASE 01/19/2020 Pharyngeal Phase Impaired Pharyngeal- Pudding Teaspoon -- Pharyngeal -- Pharyngeal- Pudding Cup -- Pharyngeal -- Pharyngeal- Honey Teaspoon -- Pharyngeal -- Pharyngeal- Honey Cup -- Pharyngeal -- Pharyngeal- Nectar Teaspoon Delayed swallow initiation-pyriform sinuses;Reduced pharyngeal peristalsis;Reduced epiglottic inversion;Reduced anterior laryngeal mobility;Reduced laryngeal elevation;Reduced tongue base retraction;Pharyngeal residue - valleculae;Pharyngeal residue - pyriform;Pharyngeal residue - posterior pharnyx;Pharyngeal residue - cp segment;Lateral channel residue;Inter-arytenoid space residue Pharyngeal -- Pharyngeal- Nectar Cup Delayed swallow initiation-pyriform sinuses;Reduced pharyngeal peristalsis;Reduced epiglottic inversion;Reduced anterior laryngeal mobility;Reduced laryngeal elevation;Reduced tongue base retraction;Pharyngeal residue - pyriform;Pharyngeal residue - posterior pharnyx;Pharyngeal residue - cp segment;Pharyngeal residue - valleculae;Inter-arytenoid space residue;Lateral channel residue Pharyngeal -- Pharyngeal- Nectar Straw -- Pharyngeal -- Pharyngeal- Thin  Teaspoon Delayed swallow initiation-pyriform sinuses;Reduced pharyngeal peristalsis;Reduced epiglottic inversion;Reduced anterior laryngeal mobility;Reduced laryngeal elevation;Reduced tongue base retraction;Pharyngeal residue - valleculae;Pharyngeal residue - pyriform;Pharyngeal residue - posterior pharnyx;Pharyngeal residue - cp segment;Inter-arytenoid space residue;Lateral channel residue Pharyngeal -- Pharyngeal- Thin Cup Delayed swallow initiation-pyriform sinuses;Reduced pharyngeal peristalsis;Reduced epiglottic inversion;Reduced anterior laryngeal mobility;Reduced laryngeal elevation;Reduced tongue base retraction;Pharyngeal residue - valleculae;Pharyngeal residue - pyriform;Pharyngeal residue - posterior pharnyx;Pharyngeal residue - cp segment;Inter-arytenoid space residue;Lateral channel residue Pharyngeal -- Pharyngeal- Thin Straw -- Pharyngeal -- Pharyngeal- Puree Delayed swallow initiation-vallecula;Delayed swallow initiation-pyriform sinuses;Reduced pharyngeal peristalsis;Reduced anterior laryngeal mobility;Reduced laryngeal elevation;Reduced tongue base retraction;Pharyngeal residue - valleculae;Pharyngeal residue - pyriform;Pharyngeal residue - posterior pharnyx;Pharyngeal residue - cp segment;Inter-arytenoid space residue;Lateral channel residue Pharyngeal -- Pharyngeal- Mechanical Soft Delayed swallow initiation-pyriform sinuses;Reduced pharyngeal peristalsis;Reduced epiglottic inversion;Reduced anterior laryngeal mobility;Reduced laryngeal elevation;Reduced tongue base retraction;Pharyngeal residue - valleculae;Pharyngeal residue - pyriform;Pharyngeal residue - posterior pharnyx;Pharyngeal residue - cp segment;Inter-arytenoid space residue;Lateral channel residue Pharyngeal -- Pharyngeal- Regular -- Pharyngeal -- Pharyngeal- Multi-consistency -- Pharyngeal -- Pharyngeal- Pill Delayed swallow initiation-pyriform sinuses;Reduced pharyngeal peristalsis;Reduced anterior laryngeal mobility;Reduced  laryngeal elevation;Reduced tongue base retraction;Pharyngeal residue - valleculae;Pharyngeal residue - pyriform;Pharyngeal residue - posterior pharnyx;Pharyngeal residue - cp segment;Inter-arytenoid space residue Pharyngeal -- Pharyngeal Comment --  CHL IP CERVICAL ESOPHAGEAL PHASE 01/19/2020 Cervical Esophageal Phase Impaired Pudding Teaspoon -- Pudding Cup -- Honey Teaspoon -- Honey Cup -- Nectar Teaspoon -- Nectar Cup -- Nectar Straw -- Thin Teaspoon -- Thin Cup -- Thin Straw -- Puree -- Mechanical Soft -- Regular -- Multi-consistency -- Pill (No Data) Cervical Esophageal Comment -- Happi B. Rutherford Nail M.S., CCC-SLP, [facility] Speech-Language Pathologist Rehabilitation Services Office [phone] Stormy Fabian 01/19/2020, 8:00 PM              ECHOCARDIOGRAM COMPLETE  Result Date: 01/17/2020    ECHOCARDIOGRAM REPORT   Patient Name:   Perel SMALL Stangeland Date of Exam: 01/17/2020 Medical Rec #:  [id number]           Height:  64.0 in Accession #:    [id number]          Weight:       155.0 lb Date of Birth:  07-22-1940          BSA:          1.756 m Patient Age:    69 years            BP:           168/81 mmHg Patient Gender: F                   HR:           105 bpm. Exam Location:  [facility] Procedure: 2D Echo, Cardiac Doppler and Color Doppler Indications:     Atrial fibrillation 427.31  History:         Patient has prior history of Echocardiogram examinations, most                  recent 03/24/2019. Risk Factors:Diabetes. High cholesterol.  Sonographer:     Sherrie Sport RDCS (AE) Referring Phys:  [id number] Claiborne Billings A GRIFFITH Diagnosing Phys: Serafina Royals MD  Sonographer Comments: Technically difficult study due to poor echo windows, no subcostal window and no apical window. IMPRESSIONS  1. Left ventricular ejection fraction, by estimation, is 60 to 65%. The left ventricle has normal function. The left ventricle has no regional wall motion abnormalities. Left ventricular diastolic function could not be evaluated.  2.  Right ventricular systolic function is normal. The right ventricular size is normal.  3. The mitral valve is grossly normal. No evidence of mitral valve regurgitation.  4. The aortic valve is grossly normal. Aortic valve regurgitation is not visualized. FINDINGS  Left Ventricle: Left ventricular ejection fraction, by estimation, is 60 to 65%. The left ventricle has normal function. The left ventricle has no regional wall motion abnormalities. The left ventricular internal cavity size was normal in size. There is  no left ventricular hypertrophy. Left ventricular diastolic function could not be evaluated. Right Ventricle: The right ventricular size is normal. No increase in right ventricular wall thickness. Right ventricular systolic function is normal. Left Atrium: Left atrial size was normal in size. Right Atrium: Right atrial size was normal in size. Pericardium: There is no evidence of pericardial effusion. Mitral Valve: The mitral valve is grossly normal. No evidence of mitral valve regurgitation. Tricuspid Valve: The tricuspid valve is grossly normal. Tricuspid valve regurgitation is not demonstrated. Aortic Valve: The aortic valve is grossly normal. Aortic valve regurgitation is not visualized. Pulmonic Valve: The pulmonic valve was not assessed. Pulmonic valve regurgitation is not visualized. Aorta: The aortic root and ascending aorta are structurally normal, with no evidence of dilitation. IAS/Shunts: The interatrial septum was not assessed.  LEFT VENTRICLE PLAX 2D LVIDd:         3.22 cm LVIDs:         1.78 cm LV PW:         1.10 cm LV IVS:        1.19 cm LVOT diam:     2.00 cm LVOT Area:     3.14 cm  LEFT ATRIUM         Index LA diam:    1.80 cm 1.03 cm/m   AORTA Ao Root diam: 2.30 cm  SHUNTS Systemic Diam: 2.00 cm Serafina Royals MD Electronically signed by Serafina Royals MD Signature Date/Time: 01/17/2020/8:39:33 AM    Final    Results for orders  placed or performed during the hospital encounter of  01/23/20 (from the past 24 hour(s))  Glucose, capillary     Status: Abnormal   Collection Time: 01/31/20  7:12 PM  Result Value Ref Range   Glucose-Capillary 205 (H) 70 - 99 mg/dL  Blood gas, arterial     Status: Abnormal   Collection Time: 01/31/20  9:02 PM  Result Value Ref Range   FIO2 0.35    Delivery systems VENTILATOR    Mode PRESSURE REGULATED VOLUME CONTROL    VT 450 mL   LHR 16 resp/min   Peep/cpap 5.0 cm H20   pH, Arterial 7.46 (H) 7.35 - 7.45   pCO2 arterial 44 32 - 48 mmHg   pO2, Arterial 162 (H) 83 - 108 mmHg   Bicarbonate 31.3 (H) 20.0 - 28.0 mmol/L   Acid-Base Excess 6.6 (H) 0.0 - 2.0 mmol/L   O2 Saturation 99.5 %   Patient temperature 37.0    Collection site LEFT RADIAL    Sample type ARTERIAL DRAW    Allens test (pass/fail) PASS PASS  Glucose, capillary     Status: Abnormal   Collection Time: 01/31/20 11:26 PM  Result Value Ref Range   Glucose-Capillary 161 (H) 70 - 99 mg/dL  Glucose, capillary     Status: Abnormal   Collection Time: 02/01/20  4:34 AM  Result Value Ref Range   Glucose-Capillary 174 (H) 70 - 99 mg/dL  CBC with Differential/Platelet     Status: Abnormal   Collection Time: 02/01/20  5:04 AM  Result Value Ref Range   WBC 8.4 4.0 - 10.5 K/uL   RBC 3.34 (L) 3.87 - 5.11 MIL/uL   Hemoglobin 8.7 (L) 12.0 - 15.0 g/dL   HCT 28.8 (L) 36 - 46 %   MCV 86.2 80.0 - 100.0 fL   MCH 26.0 26.0 - 34.0 pg   MCHC 30.2 30.0 - 36.0 g/dL   RDW 13.7 11.5 - 15.5 %   Platelets 154 150 - 400 K/uL   nRBC 0.0 0.0 - 0.2 %   Neutrophils Relative % 72 %   Neutro Abs 6.2 1.7 - 7.7 K/uL   Lymphocytes Relative 16 %   Lymphs Abs 1.3 0.7 - 4.0 K/uL   Monocytes Relative 9 %   Monocytes Absolute 0.7 0.1 - 1.0 K/uL   Eosinophils Relative 2 %   Eosinophils Absolute 0.1 0.0 - 0.5 K/uL   Basophils Relative 0 %   Basophils Absolute 0.0 0.0 - 0.1 K/uL   Immature Granulocytes 1 %   Abs Immature Granulocytes 0.07 0.00 - 0.07 K/uL  Magnesium     Status: None   Collection  Time: 02/01/20  5:04 AM  Result Value Ref Range   Magnesium 1.9 1.7 - 2.4 mg/dL  Phosphorus     Status: Abnormal   Collection Time: 02/01/20  5:04 AM  Result Value Ref Range   Phosphorus 1.8 (L) 2.5 - 4.6 mg/dL  Basic metabolic panel     Status: Abnormal   Collection Time: 02/01/20  5:04 AM  Result Value Ref Range   Sodium 137 135 - 145 mmol/L   Potassium 4.6 3.5 - 5.1 mmol/L   Chloride 101 98 - 111 mmol/L   CO2 29 22 - 32 mmol/L   Glucose, Bld 203 (H) 70 - 99 mg/dL   BUN 14 8 - 23 mg/dL   Creatinine, Ser 0.69 0.44 - 1.00 mg/dL   Calcium 8.1 (L) 8.9 - 10.3 mg/dL   GFR, Estimated >60 >60  mL/min   Anion gap 7 5 - 15  Glucose, capillary     Status: Abnormal   Collection Time: 02/01/20  6:49 AM  Result Value Ref Range   Glucose-Capillary 202 (H) 70 - 99 mg/dL  Glucose, capillary     Status: Abnormal   Collection Time: 02/01/20 11:10 AM  Result Value Ref Range   Glucose-Capillary 143 (H) 70 - 99 mg/dL  Glucose, capillary     Status: Abnormal   Collection Time: 02/01/20  4:13 PM  Result Value Ref Range   Glucose-Capillary 164 (H) 70 - 99 mg/dL   Consults: Palliative Care PCCM   Objective:  Vital signs for last 24 hours: Temp:  [98 F (36.7 C)-99.5 F (37.5 C)] 98.7 F (37.1 C) (12/06 1600) Pulse Rate:  [88-117] 92 (12/06 1800) Resp:  [14-26] 16 (12/06 1800) BP: (80-145)/(41-112) 134/112 (12/06 1800) SpO2:  [96 %-100 %] 100 % (12/06 1800) FiO2 (%):  [30 %-50 %] 30 % (12/06 1600) Weight:  [72.7 kg] 72.7 kg (12/06 0500)  Hemodynamic parameters for last 24 hours:    Intake/Output from previous day: 12/05 0701 - 12/06 0700 In: 2493.1 [I.V.:1010.2; NG/GT:982.5; IV Piggyback:500.4] Out: 1400 [Urine:1400]  Intake/Output this shift: No intake/output data recorded.  Vent settings for last 24 hours: Vent Mode: PRVC FiO2 (%):  [30 %-50 %] 30 % Set Rate:  [16 bmp] 16 bmp Vt Set:  [390 mL-450 mL] 390 mL PEEP:  [5 cmH20] 5 cmH20  REVIEW OF SYSTEMS  PATIENT IS UNABLE  TO PROVIDE COMPLETE REVIEW OF SYSTEM S DUE TO SEVERE CRITICAL ILLNESS   PHYSICAL EXAMINATION:  GENERAL:critically ill appearing, +resp distress s/p trach HEAD: Normocephalic, atraumatic.  EYES: Pupils equal, round, reactive to light.  No scleral icterus.  MOUTH: Moist mucosal membrane. NECK: Supple. No thyromegaly. No nodules. No JVD.  PULMONARY: +rhonchi, +wheezing CARDIOVASCULAR: S1 and S2. Regular rate and rhythm. No murmurs, rubs, or gallops.  GASTROINTESTINAL: Soft, nontender, -distended. Positive bowel sounds.  MUSCULOSKELETAL: No swelling, clubbing, or edema.  NEUROLOGIC: obtunded SKIN:intact,warm,dry   ALL OTHER ROS ARE NEGATIVE    Assessment/Plan:  Synopsis: 79 yo female with 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 presenting with AMS and Acute hypoxic & hypercarbic respiratory failure requiring mechanical ventilation admitted to the ICU. Noncompliant with home nocturnal vent for months, signs and status post gastrostomy tube.  Acute Hypoxic & Hypercapnic Respiratory Failure  Hypercapnia due to severe muscle weakness Recurrent issue-needs vent to survive  Acute encephalopathy due to hypercapnea Needs Vent support     GI GI PROPHYLAXIS as indicated  NUTRITIONAL STATUS DIET-->TF's as tolerated Constipation protocol as indicated ENDO - ICU hypoglycemic\Hyperglycemia protocol -check FSBS per protocol  ELECTROLYTES -follow labs as needed -replace as needed -pharmacy consultation and following   DVT/GI PRX ordered and assessed TRANSFUSIONS AS NEEDED MONITOR FSBS I Assessed the need for Labs I Assessed the need for Foley I Assessed the need for Central Venous Line Family Discussion when available I Assessed the need for Mobilization I made an Assessment of medications to be adjusted accordingly Safety Risk assessment completed  CASE DISCUSSED IN MULTIDISCIPLINARY ROUNDS WITH ICU TEAM    Critical Care Time devoted to patient care services described in this note  is 35  minutes.   Overall, patient is critically ill, prognosis is guarded.    Corrin Parker, M.D.  [facility]  Medical Director [place] Director [facility] Cardio-Pulmonary Department

## 2020-02-01 NOTE — Significant Event (Signed)
  MAP was 63 earlier.   Ordered LR bolus 500 mL. MAP now 60. Temp 99.5 axillary.  Apparently developed hypotension after intubation/positive pressure. CXR shows some atlectasis/possibly consolidation in the left base.  Will order tracheal aspirate for culture. Will start levophed peripherally -- has 2 peripheral IVs.  If dose goes > 10-15 mcg, will consider central line and A-line. If develops fever or increase pressor requirement, will order blood cultures and start empiric antibiotics for VAP.   ___________________ Samuella Cota. Reinaldo Berber, MD [place] [facility]

## 2020-02-01 NOTE — Progress Notes (Signed)
IVF bolus given per order, BP 106/42 (60). MD notified and acknowledged. Discussed starting Levophed to keep MAP >65. Will continue to monitor.

## 2020-02-01 NOTE — Progress Notes (Signed)
Daily Progress Note   Patient Name: Angela David       Date: 02/01/2020 DOB: 10/01/1940  Age: 79 y.o. MRN#: [id number] Attending Physician: Flora Lipps, MD Primary Care Physician: Iva Boop, MD Admit Date: 01/23/2020  Reason for Consultation/Follow-up: Establishing goals of care  Subjective: Patient is resting in bed on ventilator. She is alert and oriented, but she is unaware she is on ventilator.  She held her breath and it made her slightly cough. She states she does not want to live on the ventilator, and states "I don't want to stay on this long at all". She states she has to think about what "not a long time means." Will return tomorrow.     Length of Stay: 9  Current Medications: Scheduled Meds:  . albuterol  2.5 mg Nebulization BID  . allopurinol  100 mg Per NG tube Daily  . busPIRone  30 mg Per NG tube BID  . chlorhexidine gluconate (MEDLINE KIT)  15 mL Mouth Rinse BID  . Chlorhexidine Gluconate Cloth  6 each Topical Daily  . enoxaparin (LOVENOX) injection  40 mg Subcutaneous Q24H  . escitalopram  30 mg Per Tube Daily  . feeding supplement (PROSource TF)  45 mL Per Tube Daily  . folic acid  1 mg Per Tube Daily  . free water  100 mL Per Tube Q6H  . gabapentin  600 mg Per Tube Q8H  . insulin aspart  0-9 Units Subcutaneous Q4H  . insulin glargine  10 Units Subcutaneous QHS  . mouth rinse  15 mL Mouth Rinse 10 times per day  . melatonin  5 mg Per Tube QHS  . multivitamin with minerals  1 tablet Per Tube Daily  . pantoprazole (PROTONIX) IV  40 mg Intravenous QHS  . pravastatin  20 mg Per NG tube q1800  . rOPINIRole  0.5 mg Per Tube QHS    Continuous Infusions: . sodium chloride Stopped (02/01/20 0110)  . sodium chloride 10 mL/hr at 02/01/20 1235  .  feeding supplement (OSMOLITE 1.5 CAL) 1,000 mL (02/01/20 1106)  . lactated ringers 75 mL/hr at 02/01/20 1235    PRN Meds: acetaminophen, acetaminophen, fentaNYL (SUBLIMAZE) injection, ondansetron (ZOFRAN) IV  Physical Exam Pulmonary:     Comments: On ventilator.  Neurological:     Mental Status: She is  alert.             Vital Signs: BP 138/71 (BP Location: Right Leg)   Pulse 92   Temp 98.6 F (37 C) (Axillary)   Resp 20   Ht 5' 4.02" (1.626 m)   Wt 72.7 kg   SpO2 100%   BMI 27.50 kg/m  SpO2: SpO2: 100 % O2 Device: O2 Device: Ventilator O2 Flow Rate: O2 Flow Rate (L/min): 5 L/min  Intake/output summary:   Intake/Output Summary (Last 24 hours) at 02/01/2020 1521 Last data filed at 02/01/2020 1235 Gross per 24 hour  Intake 2835.15 ml  Output 1640 ml  Net 1195.15 ml   LBM: Last BM Date: 01/31/20 Baseline Weight: Weight: 74.6 kg Most recent weight: Weight: 72.7 kg       Palliative Assessment/Data:      Patient Active Problem List   Diagnosis Date Noted  . Chronic diastolic CHF (congestive heart failure) ([place]) 01/31/2020  . Uncontrolled type 2 diabetes mellitus with hyperglycemia ([place]) 01/31/2020  . AF (paroxysmal atrial fibrillation) ([place]) 01/31/2020  . Aspiration pneumonia of both lower lobes due to gastric secretions ([place]) 01/28/2020  . Acute metabolic encephalopathy 81/19/1478  . Acute respiratory failure ([place]) 01/23/2020  . Left lower lobe pneumonia 01/16/2020  . 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 Drug-induced polyneuropathy ([place]) 05/05/2019  . 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[place]) 05/05/2019  . Weakness   . Hypoxia   . Acute respiratory failure with hypoxia and hypercapnia ([place]) 03/21/2019  . Hypotension 03/21/2019  . Acute encephalopathy 03/21/2019  . Multiple fractures of ribs, right side, initial encounter for closed fracture 12/25/2018  . Colon polyp 11/06/2018  . Esophageal dysphagia   . 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  Stricture and stenosis of esophagus   . Elevated CEA 09/02/2018  . Goals of care, counseling/discussion 09/02/2018  . Polyp of transverse colon 09/02/2018  . Foreign body in right foot 05/26/2018  . History of tracheostomy 04/30/2018  . Moderate episode of recurrent major depressive disorder ([place]) 04/03/2018  . Poor balance 04/03/2018  . Shoulder lesion, right 02/28/2017  . Cervical radiculopathy 09/28/2016  . Peripheral neuropathy 08/28/2016  . Diabetic peripheral neuropathy ([place]) 08/28/2016  . Leg weakness, bilateral 08/13/2016  . Gait abnormality 08/13/2016  . Carcinoma of overlapping sites of right breast in female, estrogen receptor negative ([place]) 12/27/2015  . Malignant neoplasm of breast ([place]) 03/28/2015  . 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 Hypercholesterolemia 03/28/2015  . Recurrent major depressive disorder, in partial remission ([place]) 03/01/2015  . 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 Cephalalgia 09/15/2014  . Headache due to trauma 09/15/2014  . Osteopenia 01/07/2014  . Age-related osteoporosis without current pathological fracture 01/07/2014  . History of colonic polyps 12/14/2013  . History of breast cancer 11/26/2012  . H/O malignant neoplasm of breast 11/26/2012  . 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 Tracheostomy present ([place]) 12/06/2011  . Calcification of bronchus or trachea 01/16/2011  . 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 Anxiety state 12/19/2010  . Blood in sputum 12/19/2010  . HLD (hyperlipidemia) 12/19/2010  . Type 2 diabetes mellitus ([place]) 12/19/2010    Palliative Care Assessment & Plan    Recommendations/Plan:  Now on vent. She is determining how long she would be willing to live on a vent and wants to speak with husband.     Code Status:    Code Status Orders  (From admission, onward)         Start  Ordered   01/26/20 1058  Limited resuscitation (code)  Continuous       Question Answer Comment   In the event of cardiac or respiratory ARREST: Initiate Code Blue, Call Rapid Response Yes   In the event of cardiac or respiratory ARREST: Perform CPR No   In the event of cardiac or respiratory ARREST: Perform Intubation/Mechanical Ventilation Yes   In the event of cardiac or respiratory ARREST: Use NIPPV/BiPAp only if indicated Yes   In the event of cardiac or respiratory ARREST: Administer ACLS medications if indicated Yes   In the event of cardiac or respiratory ARREST: Perform Defibrillation or Cardioversion if indicated Yes   Comments MOST form on chart. If she is found not breathing or with no heartbeat, she wishes to be left that way with no intervention.      01/26/20 1058        Code Status History    Date Active Date Inactive Code Status Order ID Comments User Context   01/23/2020 2152 01/26/2020 1058 Full Code [id number]  Rust-Chester, Huel Cote, NP ED   01/15/2020 2239 01/21/2020 2131 Full Code [id number]  CoxBriant Cedar, DO ED   03/21/2019 0108 04/01/2019 2227 Full Code [id number]  Awilda Bill, NP ED   12/26/2018 0336 12/31/2018 2219 Full Code [id number]  Tylene Fantasia, PA-C ED   11/06/2018 1233 11/09/2018 1917 Full Code [id number]  Jules Husbands, MD Inpatient   Advance Care Planning Activity       Prognosis:   Unable to determine    Care plan was discussed with OT.   Thank you for allowing the Palliative Medicine Team to assist in the care of this patient.   Total Time 15 min Prolonged Time Billed  no      Greater than 50%  of this time was spent counseling and coordinating care related to the above assessment and plan.  Asencion Gowda, NP  Please contact Palliative Medicine Team phone at [phone] for questions and concerns.

## 2020-02-01 NOTE — Progress Notes (Signed)
OT Cancellation Note  Patient Details Name: Angela David MRN: [id number] DOB: 01/06/1941   Cancelled Treatment:    Reason Eval/Treat Not Completed: Medical issues which prohibited therapy. Per chart review pt noted to have had rapid response 12/5 and has transitioned to higher level care. Per therapy protocols, will require new orders or continue at transfer to initiate therapy services. Will follow acutely and initiate services as pt medically appropriate.   Dessie Coma, M.S. OTR/L  02/01/20, 9:11 AM  ascom [phone]

## 2020-02-01 NOTE — Progress Notes (Signed)
MD updated, BP and MAP continue to improve this shift. MAP has consistently remained >65. Decreased urine output 300 ml this shift. Acknowledged, no new orders at this time.

## 2020-02-01 NOTE — Progress Notes (Signed)
MD notified of MAP trending less than 65. Acknowledged, see new order for IVF bolus.

## 2020-02-01 NOTE — Progress Notes (Signed)
PT Cancellation Note  Patient Details Name: Mckinna Demars MRN: [id number] DOB: [birth year]   Cancelled Treatment:    Reason Eval/Treat Not Completed: Medical issues which prohibited therapy Pt had a rapid response called 12/5, transferred back to CCU needing higher level of care.  Will complete PT orders at this time, will need new orders to initiate PT when medically appropriate.    Kreg Shropshire, DPT 02/01/2020, 10:31 AM

## 2020-02-02 DIAGNOSIS — I480 Paroxysmal atrial fibrillation: Secondary | ICD-10-CM | POA: Diagnosis not present

## 2020-02-02 DIAGNOSIS — I5032 Chronic diastolic (congestive) heart failure: Secondary | ICD-10-CM

## 2020-02-02 DIAGNOSIS — J690 Pneumonitis due to inhalation of food and vomit: Secondary | ICD-10-CM | POA: Diagnosis not present

## 2020-02-02 DIAGNOSIS — J9601 Acute respiratory failure with hypoxia: Secondary | ICD-10-CM | POA: Diagnosis not present

## 2020-02-02 LAB — CBC WITH DIFFERENTIAL/PLATELET
Abs Immature Granulocytes: 0.05 10*3/uL (ref 0.00–0.07)
Basophils Absolute: 0.1 10*3/uL (ref 0.0–0.1)
Basophils Relative: 1 %
Eosinophils Absolute: 0.3 10*3/uL (ref 0.0–0.5)
Eosinophils Relative: 4 %
HCT: 30.3 % — ABNORMAL LOW (ref 36.0–46.0)
Hemoglobin: 9.2 g/dL — ABNORMAL LOW (ref 12.0–15.0)
Immature Granulocytes: 1 %
Lymphocytes Relative: 19 %
Lymphs Abs: 1.4 10*3/uL (ref 0.7–4.0)
MCH: 26.3 pg (ref 26.0–34.0)
MCHC: 30.4 g/dL (ref 30.0–36.0)
MCV: 86.6 fL (ref 80.0–100.0)
Monocytes Absolute: 0.4 10*3/uL (ref 0.1–1.0)
Monocytes Relative: 6 %
Neutro Abs: 5.1 10*3/uL (ref 1.7–7.7)
Neutrophils Relative %: 69 %
Platelets: 148 10*3/uL — ABNORMAL LOW (ref 150–400)
RBC: 3.5 MIL/uL — ABNORMAL LOW (ref 3.87–5.11)
RDW: 13.6 % (ref 11.5–15.5)
WBC: 7.3 10*3/uL (ref 4.0–10.5)
nRBC: 0 % (ref 0.0–0.2)

## 2020-02-02 LAB — BASIC METABOLIC PANEL
Anion gap: 8 (ref 5–15)
BUN: 12 mg/dL (ref 8–23)
CO2: 30 mmol/L (ref 22–32)
Calcium: 9.3 mg/dL (ref 8.9–10.3)
Chloride: 102 mmol/L (ref 98–111)
Creatinine, Ser: 0.73 mg/dL (ref 0.44–1.00)
GFR, Estimated: >60 mL/min (ref >60)
Glucose, Bld: 184 mg/dL — ABNORMAL HIGH (ref 70–99)
Potassium: 4.6 mmol/L (ref 3.5–5.1)
Sodium: 140 mmol/L (ref 135–145)

## 2020-02-02 LAB — GLUCOSE, CAPILLARY
Glucose-Capillary: 151 mg/dL — ABNORMAL HIGH (ref 70–99)
Glucose-Capillary: 167 mg/dL — ABNORMAL HIGH (ref 70–99)
Glucose-Capillary: 170 mg/dL — ABNORMAL HIGH (ref 70–99)
Glucose-Capillary: 177 mg/dL — ABNORMAL HIGH (ref 70–99)
Glucose-Capillary: 195 mg/dL — ABNORMAL HIGH (ref 70–99)
Glucose-Capillary: 261 mg/dL — ABNORMAL HIGH (ref 70–99)

## 2020-02-02 LAB — PHOSPHORUS: Phosphorus: 3.1 mg/dL (ref 2.5–4.6)

## 2020-02-02 LAB — MAGNESIUM: Magnesium: 1.8 mg/dL (ref 1.7–2.4)

## 2020-02-02 MED ORDER — MAGNESIUM SULFATE 2 GM/50ML IV SOLN
2.0000 g | Freq: Once | INTRAVENOUS | Status: AC
  Administered 2020-02-02: 2 g via INTRAVENOUS
  Filled 2020-02-02: qty 50

## 2020-02-02 MED ORDER — PROSOURCE TF PO LIQD
45.0000 mL | Freq: Three times a day (TID) | ORAL | Status: DC
  Administered 2020-02-02 – 2020-02-03 (×3): 45 mL
  Filled 2020-02-02: qty 45

## 2020-02-02 MED ORDER — OSMOLITE 1.5 CAL PO LIQD
1000.0000 mL | ORAL | Status: DC
  Administered 2020-02-02: 1000 mL

## 2020-02-02 NOTE — Progress Notes (Signed)
Nutrition Follow-up  DOCUMENTATION CODES:   Not applicable  INTERVENTION:  Initiate new goal TF regimen of Osmolite 1.5 Cal at 45 mL/hr (1080 mL goal daily volume) + PROSource TF 45 mL TID per tube. Provides 1740 kcal, 101 grams of protein, 821 mL H2O daily.  NUTRITION DIAGNOSIS:   Inadequate oral intake related to inability to eat as evidenced by NPO status.  Ongoing.  GOAL:   Patient will meet greater than or equal to 90% of their needs  Met with TF regimen.  MONITOR:   Labs, Weight trends, TF tolerance, Skin, I & O's  REASON FOR ASSESSMENT:   Ventilator, Consult Enteral/tube feeding initiation and management  ASSESSMENT:   79 year old female with PMHx of breast cancer s/p lumpectomy and chemo/XRT, DM, diabetic peripheral neuropathy, GERD, vocal cord paralysis, tracheal stenosis requiring chronic tracheostomy, recent admission 11/19-11/25 admitted with acute hypoxic and hypercapnic respiratory failure secondary to suspected aspiration PNA, acute encephalopathy.  11/27 placed on ventilator support via tracheostomy 11/28 adult TF protocol initiated 11/29 transitioned to trach collar 12/3 s/p robotic assisted laparoscopic G-tube placement with lysis of adhesions 12/5 transferred to ICU after rapid response and placed back on ventilator  Met with patient and husband at bedside this morning. Patient reports tolerating tube feeds well via G-tube. She denies N/V. She does endorse some pain on right side of abdomen. Husband concerned that patient is less alert today. Discussed with RN.  Patient is currently on ventilator support via tracheostomy MV: 6.2 L/min Temp (24hrs), Avg:98.8 F (37.1 C), Min:97.3 F (36.3 C), Max:102.8 F (39.3 C)  Medications reviewed and include: allopurinol, folic acid 1 mg daily, free water 100 mL Q6hrs, Novolog 0-9 units Q4hrs, Lantsu 10 units QHS, MVI daily, Protonix.  Labs reviewed: CBG 151-177. Potassium, magnesium, and phosphorus  WNL.  I/O: 3140 mL UOP yesterday (1.8 mL/kg/hr)  Weight trend: 72.4 kg on 12/7; -2.2 kg from 12/3  Enteral Access: 16 Fr. Angelica Ran placed 12/3 by surgery  TF regimen: Osmolite 1.5 Cal at 50 mL/hr + PROSource TF 45 mL once daily  Discussed on rounds. Patient now will require ventilator support. Patient is being followed by palliative and deciding on how long she would like to remain on ventilator support.  Diet Order:   Diet Order            Diet NPO time specified Except for: Other (See Comments)  Diet effective now                EDUCATION NEEDS:   No education needs have been identified at this time  Skin:  Skin Assessment: Skin Integrity Issues: Skin Integrity Issues:: Incisions Incisions: closed incision to abdomen  Last BM:  01/31/2020 per chart  Height:   Ht Readings from Last 1 Encounters:  02/02/20 5' 4.02" (1.626 m)   Weight:   Wt Readings from Last 1 Encounters:  02/02/20 72.4 kg   Ideal Body Weight:  54.5 kg  BMI:  Body mass index is 27.38 kg/m.  Estimated Nutritional Needs:   Kcal:  1600-1800kcal/day  Protein:  95-110 grams  Fluid:  1.6L/day  [name], MS, RD, LDN Pager number available on Amion

## 2020-02-02 NOTE — Progress Notes (Signed)
Follow up - Critical Care Medicine Note  Patient Details:    Angela David is an 79 y.o. 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  Patient was transferred to the intensive care unit 12/5 after rapid response was called on the floor and the patient was noted to be obtunded and unresponsive.  She had had mucous plugging issues during the day.  Patient placed back on the ventilator.  Initially admitted on 11/27.  PCCM asked to assume care.  Lines, Airways, Drains: Gastrostomy/Enterostomy Gastrostomy 16 Fr. LUQ (Active)  Surrounding Skin Dry;Intact 01/31/20 0813  Tube Status Other (Comment) 01/31/20 0813  Drainage Appearance None 01/31/20 0813  Dressing Status Clean;Dry;Intact 01/31/20 0813  Dressing Intervention Dressing reinforced 01/31/20 0813  Dressing Type Split gauze 01/31/20 0813     Urethral Catheter Olen Pel NT Non-latex 14 Fr. (Active)  Indication for Insertion or Continuance of Catheter Unstable critically ill patients first 24-48 hours (See Criteria) 01/31/20 2000  Site Assessment Clean;Intact 01/31/20 2000  Catheter Maintenance Bag below level of bladder;Catheter secured;Drainage bag/tubing not touching floor;Insertion date on drainage bag;No dependent loops;Seal intact 01/31/20 2000  Collection Container Standard drainage bag 01/31/20 2000  Securement Method Securing device (Describe) 01/31/20 2000  Urinary Catheter Interventions (if applicable) Unclamped 91/79/15 2000  Output (mL) 1100 mL 01/31/20 1736    Anti-infectives:  Anti-infectives (From admission, onward)   Start     Dose/Rate Route Frequency Ordered Stop   01/29/20 0600  cefoTEtan (CEFOTAN) 2 g in sodium chloride 0.9 % 100 mL IVPB       Note to Pharmacy: on call to OR, please do not give on the floor   2 g 200 mL/hr over 30 Minutes Intravenous On call to O.R. 01/28/20 1032 01/29/20 1111   01/24/20 1000   ceFEPIme (MAXIPIME) 2 g in sodium chloride 0.9 % 100 mL IVPB  Status:  Discontinued        2 g 200 mL/hr over 30 Minutes Intravenous Every 12 hours 01/23/20 2219 01/24/20 0402   01/24/20 1000  vancomycin (VANCOREADY) IVPB 750 mg/150 mL  Status:  Discontinued        750 mg 150 mL/hr over 60 Minutes Intravenous Every 12 hours 01/23/20 2241 01/24/20 0402   01/23/20 2230  vancomycin (VANCOREADY) IVPB 1750 mg/350 mL        1,750 mg 175 mL/hr over 120 Minutes Intravenous  Once 01/23/20 2220 01/24/20 0045   01/23/20 2130  vancomycin (VANCOCIN) IVPB 1000 mg/200 mL premix  Status:  Discontinued        1,000 mg 200 mL/hr over 60 Minutes Intravenous  Once 01/23/20 2119 01/23/20 2220   01/23/20 2130  ceFEPIme (MAXIPIME) 2 g in sodium chloride 0.9 % 100 mL IVPB        2 g 200 mL/hr over 30 Minutes Intravenous  Once 01/23/20 2119 01/23/20 2211     Scheduled Meds: . albuterol  2.5 mg Nebulization BID  . allopurinol  100 mg Per NG tube Daily  . busPIRone  30 mg Per NG tube BID  . chlorhexidine gluconate (MEDLINE KIT)  15 mL Mouth Rinse BID  . Chlorhexidine Gluconate Cloth  6 each Topical Daily  . enoxaparin (LOVENOX) injection  40 mg Subcutaneous Q24H  . escitalopram  30 mg Per Tube Daily  . feeding supplement (PROSource TF)  45 mL Per Tube TID  . folic acid  1 mg Per Tube Daily  .  free water  100 mL Per Tube Q6H  . gabapentin  600 mg Per Tube Q8H  . insulin aspart  0-9 Units Subcutaneous Q4H  . insulin glargine  10 Units Subcutaneous QHS  . mouth rinse  15 mL Mouth Rinse 10 times per day  . melatonin  5 mg Per Tube QHS  . multivitamin with minerals  1 tablet Per Tube Daily  . pantoprazole (PROTONIX) IV  40 mg Intravenous QHS  . pravastatin  20 mg Per NG tube q1800  . rOPINIRole  0.5 mg Per Tube QHS   Continuous Infusions: . sodium chloride Stopped (02/01/20 0110)  . sodium chloride Stopped (02/02/20 1402)  . feeding supplement (OSMOLITE 1.5 CAL) 1,000 mL (02/02/20 1359)   PRN  Meds:.acetaminophen, acetaminophen, fentaNYL (SUBLIMAZE) injection, ondansetron (ZOFRAN) IV   Microbiology: Results for orders placed or performed during the hospital encounter of 01/23/20  Blood culture (routine single)     Status: None   Collection Time: 01/23/20  8:27 PM   Specimen: BLOOD  Result Value Ref Range Status   Specimen Description BLOOD BLOOD LEFT HAND  Final   Special Requests   Final    BOTTLES DRAWN AEROBIC AND ANAEROBIC Blood Culture adequate volume   Culture   Final    NO GROWTH 5 DAYS Performed at [facility], [address] [place],  [address]    Report Status 01/28/2020 FINAL  Final  Resp Panel by RT-PCR (Flu A&B, Covid) Nasopharyngeal Swab     Status: None   Collection Time: 01/23/20  8:27 PM   Specimen: Nasopharyngeal Swab; Nasopharyngeal(NP) swabs in vial transport medium  Result Value Ref Range Status   SARS Coronavirus 2 by RT PCR NEGATIVE NEGATIVE Final    Comment: (NOTE) SARS-CoV-2 target nucleic acids are NOT DETECTED.  The SARS-CoV-2 RNA is generally detectable in upper respiratory specimens during the acute phase of infection. The lowest concentration of SARS-CoV-2 viral copies this assay can detect is 138 copies/mL. A negative result does not preclude SARS-Cov-2 infection and should not be used as the sole basis for treatment or other patient management decisions. A negative result may occur with  improper specimen collection/handling, submission of specimen other than nasopharyngeal swab, presence of viral mutation(s) within the areas targeted by this assay, and inadequate number of viral copies(<138 copies/mL). A negative result must be combined with clinical observations, patient history, and epidemiological information. The expected result is Negative.  Fact Sheet for Patients:  EntrepreneurPulse.com.au  Fact Sheet for Healthcare Providers:  IncredibleEmployment.be  This test is no t  yet approved or cleared by the [place] FDA and  has been authorized for detection and/or diagnosis of SARS-CoV-2 by FDA under an Emergency Use Authorization (EUA). This EUA will remain  in effect (meaning this test can be used) for the duration of the COVID-19 declaration under Section 564(b)(1) of the Act, 21 U.S.C.section 360bbb-3(b)(1), unless the authorization is terminated  or revoked sooner.       Influenza A by PCR NEGATIVE NEGATIVE Final   Influenza B by PCR NEGATIVE NEGATIVE Final    Comment: (NOTE) The Xpert Xpress SARS-CoV-2/FLU/RSV plus assay is intended as an aid in the diagnosis of influenza from Nasopharyngeal swab specimens and should not be used as a sole basis for treatment. Nasal washings and aspirates are unacceptable for Xpert Xpress SARS-CoV-2/FLU/RSV testing.  Fact Sheet for Patients: EntrepreneurPulse.com.au  Fact Sheet for Healthcare Providers: IncredibleEmployment.be  This test is not yet approved or cleared by the [place]  FDA and has been authorized for detection and/or diagnosis of SARS-CoV-2 by FDA under an Emergency Use Authorization (EUA). This EUA will remain in effect (meaning this test can be used) for the duration of the COVID-19 declaration under Section 564(b)(1) of the Act, 21 U.S.C. section 360bbb-3(b)(1), unless the authorization is terminated or revoked.  Performed at [facility], [address], [place], [place] [address]   Urine culture     Status: None   Collection Time: 01/23/20  9:11 PM   Specimen: In/Out Cath Urine  Result Value Ref Range Status   Specimen Description   Final    IN/OUT CATH URINE Performed at [facility], [address] [place], [place] [address]    Special Requests   Final    NONE Performed at [facility], [address], [place], [place] [address]    Culture   Final    NO GROWTH Performed at [facility], [place] [address] [place], [place] [address]    Report Status 01/25/2020 FINAL  Final  Culture, blood (single)     Status: None   Collection Time: 01/23/20 10:05 PM   Specimen: BLOOD  Result Value Ref Range Status   Specimen Description BLOOD BLOOD LEFT FOREARM  Final   Special Requests   Final    BOTTLES DRAWN AEROBIC ONLY Blood Culture results may not be optimal due to an inadequate volume of blood received in culture bottles   Culture   Final    NO GROWTH 5 DAYS Performed at [facility], [address] [place], [place] [address]    Report Status 01/28/2020 FINAL  Final  MRSA PCR Screening     Status: None   Collection Time: 01/24/20  1:00 AM   Specimen: Nasopharyngeal  Result Value Ref Range Status   MRSA by PCR NEGATIVE NEGATIVE Final    Comment:        The GeneXpert MRSA Assay (FDA approved for NASAL specimens only), is one component of a comprehensive MRSA colonization surveillance program. It is not intended to diagnose MRSA infection nor to guide or monitor treatment for MRSA infections. Performed at [facility], [address] [place], [place] [address]   Culture, respiratory (non-expectorated)     Status: None   Collection Time: 01/24/20 11:58 AM   Specimen: SPU; Respiratory  Result Value Ref Range Status   Specimen Description   Final    SPUTUM Performed at [facility], [address] [place], [place] [address]    Special Requests   Final    NONE Performed at [facility], [place], [place] [address]    Gram Stain NO WBC SEEN NO ORGANISMS SEEN   Final   Culture   Final    RARE Normal respiratory flora-no Staph aureus or Pseudomonas seen Performed at [facility], [place] [address], [place], [place] [address]    Report Status 01/26/2020 FINAL  Final    Best Practice/Protocols:  VTE Prophylaxis: Lovenox (prophylaxtic dose) GI Prophylaxis: Proton Pump Inhibitor Continous Sedation Hyperglycemia (ICU)  Vent/VAP  Events: 01/23/20 AMS in ED, hypercarbic- placed on ventilator > Admit to ICU 11/28 remains on vent, critically ill 11/30- Patient for IR guided Dobhoff due to NPO at this time. Plan for PEG 01/30/20 01/27/20-Patient is off ventilator on trache collar.  Optimizing for downgrade to medical floor 01/29/2020: Gastrostomy placement by surgery 01/31/2020: Readmitted to ICU after rapid response patient found unresponsive with hypercarbia  and hypoxia, back on ventilator  CC  Follow up resp failure   HPI More lethargic Needs vent to survive    Studies: DG Chest 2 View  Result Date: 01/15/2020 CLINICAL DATA:  Change in mental status EXAM: CHEST - 2 VIEW COMPARISON:  Jul 20, 2019 FINDINGS: The heart size and mediastinal contours are within normal limits. Tracheostomy tube is seen at the level of the clavicular heads. Again noted is elevation of the right hemidiaphragm. There is blunting of the left costophrenic angle which be due to combination of effusion and airspace opacity. IMPRESSION: Left pleural effusion with adjacent patchy airspace opacity which could be due to atelectasis and/or infectious etiology. Electronically Signed   By: Prudencio Pair M.D.   On: 01/15/2020 16:08   DG Shoulder Right  Result Date: 01/07/2020 CLINICAL DATA:  [place] onto right shoulder EXAM: RIGHT SHOULDER - 2+ VIEW COMPARISON:  11/22/2010 FINDINGS: Frontal and transscapular views of the right shoulder are obtained. Bones are osteopenic. There is a comminuted intra-articular fracture distal margin right clavicle, with minimal distraction. No other acute bony abnormalities. Hypertrophic changes are noted of the acromioclavicular joint. Right chest is clear. IMPRESSION: 1. Comminuted intra-articular fracture distal margin right clavicle, with minimal separation of the fracture fragment. Electronically Signed   By: Randa Ngo M.D.   On: 01/07/2020 18:43   DG Tibia/Fibula Left  Result Date: 01/16/2020 CLINICAL  DATA:  Initial evaluation for bilateral lower leg pain. EXAM: LEFT TIBIA AND FIBULA - 2 VIEW COMPARISON:  None. FINDINGS: Sequelae of prior ORIF at the tibial plateau/proximal tibia with lateral malleable plate screw fixation in place. No periprosthetic lucency to suggest loosening or failure. No acute fracture dislocation. No discrete osseous lesions. Osteoarthritic changes noted about the partially visualized knee. Posterior calcaneal enthesophyte noted. No visible soft tissue abnormality. IMPRESSION: 1. Sequelae of prior ORIF at the left tibial plateau/proximal tibia. No hardware complication. 2. No acute osseous abnormality about the left tibia/fibula. Electronically Signed   By: Jeannine Boga M.D.   On: 01/16/2020 04:00   DG Tibia/Fibula Right  Result Date: 01/16/2020 CLINICAL DATA:  Initial evaluation for bilateral lower leg pain. EXAM: RIGHT TIBIA AND FIBULA - 2 VIEW COMPARISON:  None. FINDINGS: No acute fracture or dislocation. Sclerotic focus at the proximal tibia noted, nonspecific, but most likely benign. Posterior calcaneal enthesophyte. No visible soft tissue abnormality. IMPRESSION: No acute osseous abnormality about the right tibia/fibula. No visible soft tissue abnormality. Electronically Signed   By: Jeannine Boga M.D.   On: 01/16/2020 04:03   DG Abd 1 View  Result Date: 01/24/2020 CLINICAL DATA:  OG tube placement EXAM: ABDOMEN - 1 VIEW COMPARISON:  11/19/2010 FINDINGS: The OG tube projects over the gastric body. The bowel gas pattern is nonobstructive. IMPRESSION: OG tube projects over the gastric body. Electronically Signed   By: Constance Holster M.D.   On: 01/24/2020 01:12   CT Head Wo Contrast  Result Date: 01/23/2020 CLINICAL DATA:  Altered mental status. EXAM: CT HEAD WITHOUT CONTRAST TECHNIQUE: Contiguous axial images were obtained from the base of the skull through the vertex without intravenous contrast. COMPARISON:  January 15, 2020 FINDINGS: Brain: There  is mild cerebral atrophy with widening of the extra-axial spaces and ventricular dilatation. There are areas of decreased attenuation within the white matter tracts of the supratentorial brain, consistent with microvascular disease changes. Vascular: No hyperdense vessel or unexpected calcification. Skull: Normal. Negative for fracture or focal lesion. Sinuses/Orbits: No acute finding. Other: None. IMPRESSION: 1. Generalized cerebral  atrophy. 2. No acute intracranial abnormality. Electronically Signed   By: Virgina Norfolk M.D.   On: 01/23/2020 21:05   CT Head Wo Contrast  Result Date: 01/15/2020 CLINICAL DATA:  Dizziness, nonspecific. Mental status change, unknown cause. Additional history provided: Hallucinations for 1 week. EXAM: CT HEAD WITHOUT CONTRAST TECHNIQUE: Contiguous axial images were obtained from the base of the skull through the vertex without intravenous contrast. COMPARISON:  Head CT 01/07/2020.  Brain MRI 03/21/2019. FINDINGS: Brain: Mild generalized cerebral atrophy. Advanced ill-defined hypoattenuation within the cerebral white matter is nonspecific, but compatible with chronic small vessel ischemic disease. There is no acute intracranial hemorrhage. No demarcated cortical infarct. No extra-axial fluid collection. No evidence of intracranial mass. No midline shift. Vascular: No hyperdense vessel.  Atherosclerotic calcifications. Skull: Normal. Negative for fracture or focal lesion. Sinuses/Orbits: Visualized orbits show no acute finding. Small volume frothy secretions within the right sphenoid sinus. Tiny right maxillary sinus mucous retention cyst. Trace fluid within the left mastoid air cells. IMPRESSION: No evidence of acute intracranial abnormality. Mild cerebral atrophy with advanced chronic small vessel ischemic disease, stable as compared to the head CT of 01/07/2020. Right sphenoid sinusitis. Electronically Signed   By: Kellie Simmering DO   On: 01/15/2020 16:40   CT Head Wo Contrast   Result Date: 01/07/2020 CLINICAL DATA:  Head trauma, minor. Additional history provided: Unwitnessed mechanical fall hitting head. EXAM: CT HEAD WITHOUT CONTRAST TECHNIQUE: Contiguous axial images were obtained from the base of the skull through the vertex without intravenous contrast. COMPARISON:  Head CT 03/29/2019. FINDINGS: Brain: Mild generalized cerebral atrophy with advanced chronic small vessel ischemic disease, stable as compared to the prior head CT of 03/29/2019. There is no acute intracranial hemorrhage. No demarcated cortical infarct. No extra-axial fluid collection. No evidence of intracranial mass. No midline shift. Vascular: No hyperdense vessel.  Atherosclerotic calcifications Skull: Normal. Negative for fracture or focal lesion. Sinuses/Orbits: Visualized orbits show no acute finding. No significant paranasal sinus disease at the imaged levels. IMPRESSION: No evidence of acute intracranial abnormality. Mild cerebral atrophy with advanced chronic small vessel ischemic disease, stable as compared to the head CT of 03/29/2019 Electronically Signed   By: Kellie Simmering DO   On: 01/07/2020 18:38   CT ANGIO CHEST PE W OR WO CONTRAST  Result Date: 01/17/2020 CLINICAL DATA:  Suspected pulmonary embolus with low to intermediate probability. Positive D-dimer. Altered mental status. EXAM: CT ANGIOGRAPHY CHEST WITH CONTRAST TECHNIQUE: Multidetector CT imaging of the chest was performed using the standard protocol during bolus administration of intravenous contrast. Multiplanar CT image reconstructions and MIPs were obtained to evaluate the vascular anatomy. CONTRAST:  3m OMNIPAQUE IOHEXOL 350 MG/ML SOLN COMPARISON:  03/26/2019 FINDINGS: Cardiovascular: There is good opacification of the central and segmental pulmonary arteries. No focal filling defects. No evidence of significant pulmonary embolus. Cardiac enlargement. No pericardial effusions. Coronary artery and aortic calcifications. Normal caliber  thoracic aorta. No aortic dissection. Great vessel origins are patent. Mediastinum/Nodes: The esophagus is diffusely dilated and contains air and fluid. This may represent dysmotility or possibly outlet obstruction. Similar finding on previous study suggest chronic process. No significant lymphadenopathy. A tracheostomy tube is present. Lungs/Pleura: Fibrosis and scarring in the lung apices, greater on the right. Mild peripheral interstitial pattern likely also represents fibrosis. There are multiple subcentimeter nodules demonstrated throughout the right lung. These were also present on the previous study. Focal consolidation or atelectasis in the left lung base. Small patchy infiltrative areas demonstrated in the left lingula. Changes  may represent inflammatory process or aspiration. No pleural effusions. No pneumothorax. Upper Abdomen: Surgical absence of the gallbladder. No acute process demonstrated in the visualized upper abdomen. Musculoskeletal: Degenerative changes in the spine. Old deformity of the sternum, likely posttraumatic. No destructive bone lesions or acute changes identified. Review of the MIP images confirms the above findings. IMPRESSION: 1. No evidence of significant pulmonary embolus. 2. Focal consolidation or atelectasis in the left lung base. Small patchy infiltrative areas demonstrated in the left lingula. Changes may represent inflammatory process or aspiration. 3. Fibrosis and scarring in the lung apices, greater on the right. 4. Multiple subcentimeter nodules throughout the right lung. These were also present on the previous study. 5. Cardiac enlargement. 6. Dilated esophagus containing air and fluid. This may represent dysmotility or possibly outlet obstruction. Similar finding on previous study. 7. Tracheostomy tube. 8. Aortic atherosclerosis. Aortic Atherosclerosis (ICD10-I70.0). Electronically Signed   By: Lucienne Capers M.D.   On: 01/17/2020 23:42   MR BRAIN WO CONTRAST   Result Date: 01/16/2020 CLINICAL DATA:  Mental status change EXAM: MRI HEAD WITHOUT CONTRAST TECHNIQUE: Multiplanar, multiecho pulse sequences of the brain and surrounding structures were obtained without intravenous contrast. COMPARISON:  03/21/2019 FINDINGS: Motion artifact is present. Brain: There is no acute infarction or intracranial hemorrhage. There is no intracranial mass, mass effect, or edema. There is no hydrocephalus or extra-axial fluid collection. Prominence of the ventricles and sulci reflects generalized parenchymal volume loss. Patchy and confluent areas of T2 hyperintensity in the supratentorial greater than pontine white matter are nonspecific but probably reflect advanced chronic microvascular ischemic changes. Vascular: Major vessel flow voids at the skull base are preserved. Skull and upper cervical spine: Normal marrow signal is preserved. Sinuses/Orbits: Paranasal sinuses are aerated. Orbits are unremarkable. Other: Sella is unremarkable.  Mastoid air cells are clear. IMPRESSION: No acute infarction, hemorrhage, or mass. Advanced chronic microvascular ischemic changes. Electronically Signed   By: Macy Mis M.D.   On: 01/16/2020 20:02   US Venous Img Lower Bilateral (DVT)  Result Date: 01/16/2020 CLINICAL DATA:  Initial evaluation for lower extremity swelling. EXAM: BILATERAL LOWER EXTREMITY VENOUS DOPPLER ULTRASOUND TECHNIQUE: Gray-scale sonography with graded compression, as well as color Doppler and duplex ultrasound were performed to evaluate the lower extremity deep venous systems from the level of the common femoral vein and including the common femoral, femoral, profunda femoral, popliteal and calf veins including the posterior tibial, peroneal and gastrocnemius veins when visible. The superficial great saphenous vein was also interrogated. Spectral Doppler was utilized to evaluate flow at rest and with distal augmentation maneuvers in the common femoral, femoral and  popliteal veins. COMPARISON:  None. FINDINGS: RIGHT LOWER EXTREMITY Common Femoral Vein: No evidence of thrombus. Normal compressibility, respiratory phasicity and response to augmentation. Saphenofemoral Junction: No evidence of thrombus. Normal compressibility and flow on color Doppler imaging. Profunda Femoral Vein: No evidence of thrombus. Normal compressibility and flow on color Doppler imaging. Femoral Vein: No evidence of thrombus. Normal compressibility, respiratory phasicity and response to augmentation. Popliteal Vein: No evidence of thrombus. Normal compressibility, respiratory phasicity and response to augmentation. Calf Veins: No evidence of thrombus. Normal compressibility and flow on color Doppler imaging. Superficial Great Saphenous Vein: No evidence of thrombus. Normal compressibility. Venous Reflux:  None. Other Findings:  None. LEFT LOWER EXTREMITY Common Femoral Vein: No evidence of thrombus. Normal compressibility, respiratory phasicity and response to augmentation. Saphenofemoral Junction: No evidence of thrombus. Normal compressibility and flow on color Doppler imaging. Profunda Femoral Vein: No evidence of  thrombus. Normal compressibility and flow on color Doppler imaging. Femoral Vein: No evidence of thrombus. Normal compressibility, respiratory phasicity and response to augmentation. Popliteal Vein: No evidence of thrombus. Normal compressibility, respiratory phasicity and response to augmentation. Calf Veins: No evidence of thrombus. Normal compressibility and flow on color Doppler imaging. Superficial Great Saphenous Vein: No evidence of thrombus. Normal compressibility. Venous Reflux:  None. Other Findings:  None. IMPRESSION: No evidence of deep venous thrombosis in either lower extremity. Electronically Signed   By: Jeannine Boga M.D.   On: 01/16/2020 01:32   Portable Chest xray  Result Date: 02/01/2020 CLINICAL DATA:  Respiratory failure. EXAM: PORTABLE CHEST 1 VIEW  COMPARISON:  01/31/2020 FINDINGS: The tracheostomy tube is in good position, unchanged. The cardiac silhouette, mediastinal hilar contours are within normal limits and stable. Chronic bronchitic type changes and streaky areas of atelectasis but no focal infiltrates or effusions. The bony thorax is intact. IMPRESSION: 1. Stable tracheostomy tube. 2. Chronic interstitial changes and streaky areas of atelectasis. No new infiltrates or edema. Electronically Signed   By: Marijo Sanes M.D.   On: 02/01/2020 07:36   DG Chest Port 1 View  Result Date: 01/31/2020 CLINICAL DATA:  Tachycardia EXAM: PORTABLE CHEST 1 VIEW COMPARISON:  01/25/2020 FINDINGS: Mild interstitial opacity, unchanged. Tracheostomy tube tip is at the level of the clavicular heads. No pneumothorax or sizable pleural effusion. IMPRESSION: Unchanged mild interstitial opacity. Electronically Signed   By: Ulyses Jarred M.D.   On: 01/31/2020 05:39   DG Chest Port 1 View  Result Date: 01/25/2020 CLINICAL DATA:  Altered mental status, shortness of breath EXAM: PORTABLE CHEST 1 VIEW COMPARISON:  01/23/2020 FINDINGS: Tracheostomy device is present. Enteric tube passes into the stomach with tip out of field of view. Patient is rotated. Stable elevation of the right hemidiaphragm. Likely chronic mild interstitial prominence. Persistent patchy density at the left lung base no significant pleural effusion. No pneumothorax. Stable cardiomediastinal contours. IMPRESSION: Persistent patchy atelectasis/consolidation at the left lung base. Electronically Signed   By: Macy Mis M.D.   On: 01/25/2020 08:31   DG Chest Port 1 View  Result Date: 01/23/2020 CLINICAL DATA:  Sepsis. EXAM: PORTABLE CHEST 1 VIEW COMPARISON:  January 18, 2020 FINDINGS: The tracheostomy tube is stable in positioning. The heart size is unchanged. Hazy coarse lung markings are again noted. Areas of fibrosis and scarring are noted at the lung apices. There is elevation of the right  hemidiaphragm. Aortic calcifications are noted. IMPRESSION: No significant interval change. Electronically Signed   By: Constance Holster M.D.   On: 01/23/2020 20:53   DG Chest Port 1 View  Result Date: 01/18/2020 CLINICAL DATA:  Cough. EXAM: PORTABLE CHEST 1 VIEW COMPARISON:  Chest radiographs 01/15/2020 and CTA 01/17/2020 FINDINGS: The patient is rotated to the left. Tracheostomy tube overlies the airway. The cardiomediastinal silhouette is unchanged with normal heart size. There is persistent elevation of the right hemidiaphragm. Patchy left basilar airspace opacity has decreased. No sizable pleural effusion or pneumothorax is identified. Old rib fractures are noted. IMPRESSION: Improved left basilar aeration. Electronically Signed   By: Logan Bores M.D.   On: 01/18/2020 08:11   DG Abd Portable 1V  Result Date: 01/26/2020 CLINICAL DATA:  NG tube placement EXAM: PORTABLE ABDOMEN - 1 VIEW COMPARISON:  01/24/2020 abdominal radiographs and prior. FINDINGS: Non weighted enteric tube tip and side hole project over the gastric body. Gas is seen within nondilated abdominal bowel loops. Right hemiabdomen surgical clips. Multilevel spondylosis. IMPRESSION: Non weighted enteric  tube tip and side hole project over the gastric body. Electronically Signed   By: Primitivo Gauze M.D.   On: 01/26/2020 08:28   DG Swallowing Func-Speech Pathology  Result Date: 01/19/2020 Objective Swallowing Evaluation: Type of Study: MBS-Modified Barium Swallow Study  Patient Details Name: Jaicey Sweaney MRN: [id number] Date of Birth: 1940-12-07 Today's Date: 01/19/2020 Time: SLP Start Time (ACUTE ONLY): 1420 -SLP Stop Time (ACUTE ONLY): 1445 SLP Time Calculation (min) (ACUTE ONLY): 25 min Past Medical History: Past Medical History: Diagnosis Date . Breast cancer ([place]) 2002  right breast chemo and radiation . Cancer [facility]) 2002  right breast . Diabetes mellitus without complication ([place])  . Diabetic peripheral neuropathy  ([place]) 08/28/2016 . GERD (gastroesophageal reflux disease)  . Glaucoma  . High cholesterol  . History of colon polyps  . Peripheral neuropathy 08/28/2016 . Personal history of chemotherapy 2002  BREAST CA . Personal history of malignant neoplasm of breast  . Personal history of radiation therapy 2002  BREAST CA . Vocal cord paralysis 2012 Past Surgical History: Past Surgical History: Procedure Laterality Date . ABDOMINAL HYSTERECTOMY   . BREAST BIOPSY Right 2008  neg . BREAST EXCISIONAL BIOPSY Right 2002  positive . BREAST LUMPECTOMY Right 2002  chemo and radiation . BREAST SURGERY Right 2002  LUMPECTOMY, radiation, chemotherapy . CHOLECYSTECTOMY N/A 12/10/2017  Procedure: LAPAROSCOPIC CHOLECYSTECTOMY;  Surgeon: Benjamine Sprague, DO;  Location: [facility] ORS;  Service: General;  Laterality: N/A; . COLON RESECTION Right 11/06/2018  Procedure: HAND ASSISTED LAPAROSCOPIC COLON RESECTION, RIGHT;  Surgeon: Jules Husbands, MD;  Location: [facility] ORS;  Service: General;  Laterality: Right; . COLONOSCOPY  2009,12/30/2013 . COLONOSCOPY WITH PROPOFOL N/A 04/16/2018  Tubulovillous polyp proximal transverse colon, large sessile polyp, proximal transverse colon;  Surgeon: Robert Bellow, MD;  Location: [facility] ENDOSCOPY;  Service: Endoscopy;  Laterality: N/A; . ESOPHAGOGASTRODUODENOSCOPY (EGD) WITH PROPOFOL N/A 10/14/2018  Procedure: ESOPHAGOGASTRODUODENOSCOPY (EGD) WITH PROPOFOL;  Surgeon: Lucilla Lame, MD;  Location: [facility] ENDOSCOPY;  Service: Endoscopy;  Laterality: N/A; . HYDROCELE EXCISION / REPAIR   . TRACHEAL SURGERY  2013 . TRACHEOSTOMY N/A   Dr Kathyrn Sheriff HPI: Angela David is a 79 y.o. female with medical history significant for hypertension, diabetes, history of trachea stenosis status post tracheostomy who presented to the ED on 01/15/20 with mental status changes with disorientation, not recognizing family, generalized weakness for several days.  Pt has been chronically trach dependent since 2013 and is followed by [facility]  and Anda Latina (ENT, [facility]). Pertinant information: May 31, 2019: MBSS at [facility] - "Pt presents with a mild oral and mild to moderate pharyngeal dysphagia c/b reduced oral manipulation, delayed swallow initiation, reduced BOT retraction and reduced pharyngeal constriction. These led to increased oral transit time, vallecula residue (mild - thin liquids, moderate - solids), pyriform sinus residue (mild - thin liquids, moderate - solids and trace penetration of solids. Penetration cleared with swallows. Residue significantly reduced with liquid rinse and subsequent swallows. Cervical esophagus: accumulation of solids within the UES/cervical esophagus essentially cleared with liquid rinse and subsequent swallows." June 24, 2019: The patient underwent MDL with excision of suprastomal granulation tissue and tracheal balloon dilation, and esophagoscopy with UES balloon dilation. Currently pt has a fenestrated #6 [place] with inability to tolerate inner cannula and PMSV but per her husband she can achieve phonation with finger occulsion. Per husband's report, she has difficulty swallowing with it varying between moderate to severe.  Subjective: pt pleasant, alert, attentive to study Assessment / Plan / Recommendation CHL  IP CLINICAL IMPRESSIONS 01/19/2020 Clinical Impression Imaging is difficult d/t pt's poor position. Despite having pillows and support to sit upright pt leaned to her right with her head laying to her left. Pt was positioned in an oblique position for best imagaing. Pt doesn't occlud her trach when eating. Trials were given with trach unoccluded and with SLP finger occluding trach hub. No change is oropharyngeal abilities were observed. Pt has a very complex tracheal and esophageal history. This Modified Barium Swallow was provided to assess for aspiration in an effort to return pt to an oral diet. Per attending report, pt's otolaryngologist doesn't have any further recommendations for trach  management and her current ENT requested her trach to be left "as it is." Pt's HR continues to be elevated at ~ 100 but MD granted permission to have swallow study and pt's nurse was present during the study for close monitoring. Pt on 5L trach collar. Aspiration was not observed during the study but again clear visualization was difficult to achieve d/t pt positioning. Pt is not able to perform any compensatory swallow strategies.  Pt presents with severe sensori-motor oropharyngeal dysphagia. Pt's oral phase is c/b decreased lingual manipulation of puree, mechanical soft, thin liquids and nectar thick liquids. Pt utilizes lingual pumping to help with posterior propulsion of boluses. Pt has premature spillage of all boluses into her pyriform sinuses and piecemeal swallowing. Her pharyngeal phase is characterized by extremely delayed swallow initiation, decreased pharyneal squeeze and limited amounts of bolus transiting through the UES with each swallow. As a result, > 90% of each bolus residues in the pharynx that is only cleared thru the UES after 5 to 6 swallows. At baseline, pt consumes her medicine whole in puree. During this study pt cleared the puree thru her UES but the barium tablet resides in the cervical esophagus and required liquid wash and multiple swallows to clear. When completing multiple swallows per bolus, pt's HR climbed to 116 and to 118. Pt is at an extremely high risk of aspirating all PO intake as well as having a cardiac response to multiple swallows. A conservative diet would be dysphagia 1 with thin liquids via cup, medicine crushed in puree. Also recommend a consult for Palliative Care to help with GOC.  SLP Visit Diagnosis Dysphagia, oropharyngeal phase (R13.12);Dysphagia, pharyngoesophageal phase (R13.14) Attention and concentration deficit following -- Frontal lobe and executive function deficit following -- Impact on safety and function Severe aspiration risk;Risk for inadequate  nutrition/hydration   CHL IP TREATMENT RECOMMENDATION 01/19/2020 Treatment Recommendations No treatment recommended at this time   Prognosis 01/19/2020 Prognosis for Safe Diet Advancement Guarded Barriers to Reach Goals Severity of deficits;Time post onset Barriers/Prognosis Comment -- CHL IP DIET RECOMMENDATION 01/19/2020 SLP Diet Recommendations Dysphagia 1 (Puree) solids;Thin liquid Liquid Administration via Cup;No straw Medication Administration Whole meds with puree Compensations Minimize environmental distractions;Slow rate;Small sips/bites Postural Changes Seated upright at 90 degrees   CHL IP OTHER RECOMMENDATIONS 01/19/2020 Recommended Consults (No Data) Oral Care Recommendations Oral care BID Other Recommendations --   CHL IP FOLLOW UP RECOMMENDATIONS 01/19/2020 Follow up Recommendations (No Data)   CHL IP FREQUENCY AND DURATION 01/18/2020 Speech Therapy Frequency (ACUTE ONLY) min 2x/week Treatment Duration 2 weeks      CHL IP ORAL PHASE 01/19/2020 Oral Phase Impaired Oral - Pudding Teaspoon -- Oral - Pudding Cup -- Oral - Honey Teaspoon -- Oral - Honey Cup -- Oral - Nectar Teaspoon Weak lingual manipulation;Lingual pumping;Reduced posterior propulsion;Holding of bolus;Lingual/palatal residue;Piecemeal swallowing;Delayed oral transit;Decreased bolus  cohesion;Premature spillage Oral - Nectar Cup Weak lingual manipulation;Lingual pumping;Reduced posterior propulsion;Holding of bolus;Lingual/palatal residue;Piecemeal swallowing;Delayed oral transit;Decreased bolus cohesion;Premature spillage Oral - Nectar Straw -- Oral - Thin Teaspoon Weak lingual manipulation;Lingual pumping;Reduced posterior propulsion;Holding of bolus;Lingual/palatal residue;Piecemeal swallowing;Delayed oral transit;Decreased bolus cohesion;Premature spillage Oral - Thin Cup Weak lingual manipulation;Lingual pumping;Reduced posterior propulsion;Holding of bolus;Lingual/palatal residue;Piecemeal swallowing;Delayed oral transit;Decreased  bolus cohesion;Premature spillage Oral - Thin Straw -- Oral - Puree Weak lingual manipulation;Lingual pumping;Reduced posterior propulsion;Holding of bolus;Lingual/palatal residue;Piecemeal swallowing;Delayed oral transit;Decreased bolus cohesion;Premature spillage Oral - Mech Soft Impaired mastication;Weak lingual manipulation;Lingual pumping;Reduced posterior propulsion;Holding of bolus;Piecemeal swallowing;Delayed oral transit;Decreased bolus cohesion;Lingual/palatal residue Oral - Regular -- Oral - Multi-Consistency -- Oral - Pill Weak lingual manipulation;Lingual pumping;Reduced posterior propulsion;Holding of bolus;Lingual/palatal residue;Piecemeal swallowing;Delayed oral transit;Decreased bolus cohesion;Premature spillage Oral Phase - Comment --  CHL IP PHARYNGEAL PHASE 01/19/2020 Pharyngeal Phase Impaired Pharyngeal- Pudding Teaspoon -- Pharyngeal -- Pharyngeal- Pudding Cup -- Pharyngeal -- Pharyngeal- Honey Teaspoon -- Pharyngeal -- Pharyngeal- Honey Cup -- Pharyngeal -- Pharyngeal- Nectar Teaspoon Delayed swallow initiation-pyriform sinuses;Reduced pharyngeal peristalsis;Reduced epiglottic inversion;Reduced anterior laryngeal mobility;Reduced laryngeal elevation;Reduced tongue base retraction;Pharyngeal residue - valleculae;Pharyngeal residue - pyriform;Pharyngeal residue - posterior pharnyx;Pharyngeal residue - cp segment;Lateral channel residue;Inter-arytenoid space residue Pharyngeal -- Pharyngeal- Nectar Cup Delayed swallow initiation-pyriform sinuses;Reduced pharyngeal peristalsis;Reduced epiglottic inversion;Reduced anterior laryngeal mobility;Reduced laryngeal elevation;Reduced tongue base retraction;Pharyngeal residue - pyriform;Pharyngeal residue - posterior pharnyx;Pharyngeal residue - cp segment;Pharyngeal residue - valleculae;Inter-arytenoid space residue;Lateral channel residue Pharyngeal -- Pharyngeal- Nectar Straw -- Pharyngeal -- Pharyngeal- Thin Teaspoon Delayed swallow initiation-pyriform  sinuses;Reduced pharyngeal peristalsis;Reduced epiglottic inversion;Reduced anterior laryngeal mobility;Reduced laryngeal elevation;Reduced tongue base retraction;Pharyngeal residue - valleculae;Pharyngeal residue - pyriform;Pharyngeal residue - posterior pharnyx;Pharyngeal residue - cp segment;Inter-arytenoid space residue;Lateral channel residue Pharyngeal -- Pharyngeal- Thin Cup Delayed swallow initiation-pyriform sinuses;Reduced pharyngeal peristalsis;Reduced epiglottic inversion;Reduced anterior laryngeal mobility;Reduced laryngeal elevation;Reduced tongue base retraction;Pharyngeal residue - valleculae;Pharyngeal residue - pyriform;Pharyngeal residue - posterior pharnyx;Pharyngeal residue - cp segment;Inter-arytenoid space residue;Lateral channel residue Pharyngeal -- Pharyngeal- Thin Straw -- Pharyngeal -- Pharyngeal- Puree Delayed swallow initiation-vallecula;Delayed swallow initiation-pyriform sinuses;Reduced pharyngeal peristalsis;Reduced anterior laryngeal mobility;Reduced laryngeal elevation;Reduced tongue base retraction;Pharyngeal residue - valleculae;Pharyngeal residue - pyriform;Pharyngeal residue - posterior pharnyx;Pharyngeal residue - cp segment;Inter-arytenoid space residue;Lateral channel residue Pharyngeal -- Pharyngeal- Mechanical Soft Delayed swallow initiation-pyriform sinuses;Reduced pharyngeal peristalsis;Reduced epiglottic inversion;Reduced anterior laryngeal mobility;Reduced laryngeal elevation;Reduced tongue base retraction;Pharyngeal residue - valleculae;Pharyngeal residue - pyriform;Pharyngeal residue - posterior pharnyx;Pharyngeal residue - cp segment;Inter-arytenoid space residue;Lateral channel residue Pharyngeal -- Pharyngeal- Regular -- Pharyngeal -- Pharyngeal- Multi-consistency -- Pharyngeal -- Pharyngeal- Pill Delayed swallow initiation-pyriform sinuses;Reduced pharyngeal peristalsis;Reduced anterior laryngeal mobility;Reduced laryngeal elevation;Reduced tongue base  retraction;Pharyngeal residue - valleculae;Pharyngeal residue - pyriform;Pharyngeal residue - posterior pharnyx;Pharyngeal residue - cp segment;Inter-arytenoid space residue Pharyngeal -- Pharyngeal Comment --  CHL IP CERVICAL ESOPHAGEAL PHASE 01/19/2020 Cervical Esophageal Phase Impaired Pudding Teaspoon -- Pudding Cup -- Honey Teaspoon -- Honey Cup -- Nectar Teaspoon -- Nectar Cup -- Nectar Straw -- Thin Teaspoon -- Thin Cup -- Thin Straw -- Puree -- Mechanical Soft -- Regular -- Multi-consistency -- Pill (No Data) Cervical Esophageal Comment -- Happi B. Rutherford Nail M.S., CCC-SLP, [facility] Speech-Language Pathologist Rehabilitation Services Office [phone] Stormy Fabian 01/19/2020, 8:00 PM              ECHOCARDIOGRAM COMPLETE  Result Date: 01/17/2020    ECHOCARDIOGRAM REPORT   Patient Name:   Zilda SMALL Yonts Date of Exam: 01/17/2020 Medical Rec #:  [id number]           Height:       64.0 in  Accession #:    [id number]          Weight:       155.0 lb Date of Birth:  [birth year]          BSA:          1.756 m Patient Age:    72 years            BP:           168/81 mmHg Patient Gender: F                   HR:           105 bpm. Exam Location:  [facility] Procedure: 2D Echo, Cardiac Doppler and Color Doppler Indications:     Atrial fibrillation 427.31  History:         Patient has prior history of Echocardiogram examinations, most                  recent 03/24/2019. Risk Factors:Diabetes. High cholesterol.  Sonographer:     Sherrie Sport RDCS (AE) Referring Phys:  [id number] Claiborne Billings A GRIFFITH Diagnosing Phys: Serafina Royals MD  Sonographer Comments: Technically difficult study due to poor echo windows, no subcostal window and no apical window. IMPRESSIONS  1. Left ventricular ejection fraction, by estimation, is 60 to 65%. The left ventricle has normal function. The left ventricle has no regional wall motion abnormalities. Left ventricular diastolic function could not be evaluated.  2. Right ventricular systolic function is  normal. The right ventricular size is normal.  3. The mitral valve is grossly normal. No evidence of mitral valve regurgitation.  4. The aortic valve is grossly normal. Aortic valve regurgitation is not visualized. FINDINGS  Left Ventricle: Left ventricular ejection fraction, by estimation, is 60 to 65%. The left ventricle has normal function. The left ventricle has no regional wall motion abnormalities. The left ventricular internal cavity size was normal in size. There is  no left ventricular hypertrophy. Left ventricular diastolic function could not be evaluated. Right Ventricle: The right ventricular size is normal. No increase in right ventricular wall thickness. Right ventricular systolic function is normal. Left Atrium: Left atrial size was normal in size. Right Atrium: Right atrial size was normal in size. Pericardium: There is no evidence of pericardial effusion. Mitral Valve: The mitral valve is grossly normal. No evidence of mitral valve regurgitation. Tricuspid Valve: The tricuspid valve is grossly normal. Tricuspid valve regurgitation is not demonstrated. Aortic Valve: The aortic valve is grossly normal. Aortic valve regurgitation is not visualized. Pulmonic Valve: The pulmonic valve was not assessed. Pulmonic valve regurgitation is not visualized. Aorta: The aortic root and ascending aorta are structurally normal, with no evidence of dilitation. IAS/Shunts: The interatrial septum was not assessed.  LEFT VENTRICLE PLAX 2D LVIDd:         3.22 cm LVIDs:         1.78 cm LV PW:         1.10 cm LV IVS:        1.19 cm LVOT diam:     2.00 cm LVOT Area:     3.14 cm  LEFT ATRIUM         Index LA diam:    1.80 cm 1.03 cm/m   AORTA Ao Root diam: 2.30 cm  SHUNTS Systemic Diam: 2.00 cm Serafina Royals MD Electronically signed by Serafina Royals MD Signature Date/Time: 01/17/2020/8:39:33 AM    Final    Results for orders placed or  performed during the hospital encounter of 01/23/20 (from the past 24 hour(s))   Glucose, capillary     Status: Abnormal   Collection Time: 02/01/20  8:13 PM  Result Value Ref Range   Glucose-Capillary 158 (H) 70 - 99 mg/dL  Glucose, capillary     Status: Abnormal   Collection Time: 02/01/20 11:10 PM  Result Value Ref Range   Glucose-Capillary 172 (H) 70 - 99 mg/dL   Comment 1 Document in Chart   Glucose, capillary     Status: Abnormal   Collection Time: 02/01/20 11:54 PM  Result Value Ref Range   Glucose-Capillary 160 (H) 70 - 99 mg/dL  Glucose, capillary     Status: Abnormal   Collection Time: 02/02/20  3:23 AM  Result Value Ref Range   Glucose-Capillary 151 (H) 70 - 99 mg/dL   Comment 1 Document in Chart   CBC with Differential/Platelet     Status: Abnormal   Collection Time: 02/02/20  5:05 AM  Result Value Ref Range   WBC 7.3 4.0 - 10.5 K/uL   RBC 3.50 (L) 3.87 - 5.11 MIL/uL   Hemoglobin 9.2 (L) 12.0 - 15.0 g/dL   HCT 30.3 (L) 36 - 46 %   MCV 86.6 80.0 - 100.0 fL   MCH 26.3 26.0 - 34.0 pg   MCHC 30.4 30.0 - 36.0 g/dL   RDW 13.6 11.5 - 15.5 %   Platelets 148 (L) 150 - 400 K/uL   nRBC 0.0 0.0 - 0.2 %   Neutrophils Relative % 69 %   Neutro Abs 5.1 1.7 - 7.7 K/uL   Lymphocytes Relative 19 %   Lymphs Abs 1.4 0.7 - 4.0 K/uL   Monocytes Relative 6 %   Monocytes Absolute 0.4 0.1 - 1.0 K/uL   Eosinophils Relative 4 %   Eosinophils Absolute 0.3 0.0 - 0.5 K/uL   Basophils Relative 1 %   Basophils Absolute 0.1 0.0 - 0.1 K/uL   Immature Granulocytes 1 %   Abs Immature Granulocytes 0.05 0.00 - 0.07 K/uL  Magnesium     Status: None   Collection Time: 02/02/20  5:05 AM  Result Value Ref Range   Magnesium 1.8 1.7 - 2.4 mg/dL  Phosphorus     Status: None   Collection Time: 02/02/20  5:05 AM  Result Value Ref Range   Phosphorus 3.1 2.5 - 4.6 mg/dL  Basic metabolic panel     Status: Abnormal   Collection Time: 02/02/20  5:05 AM  Result Value Ref Range   Sodium 140 135 - 145 mmol/L   Potassium 4.6 3.5 - 5.1 mmol/L   Chloride 102 98 - 111 mmol/L   CO2 30  22 - 32 mmol/L   Glucose, Bld 184 (H) 70 - 99 mg/dL   BUN 12 8 - 23 mg/dL   Creatinine, Ser 0.73 0.44 - 1.00 mg/dL   Calcium 9.3 8.9 - 10.3 mg/dL   GFR, Estimated >60 >60 mL/min   Anion gap 8 5 - 15  Glucose, capillary     Status: Abnormal   Collection Time: 02/02/20  7:12 AM  Result Value Ref Range   Glucose-Capillary 177 (H) 70 - 99 mg/dL   Consults: Palliative Care PCCM   Objective:  Vital signs for last 24 hours: Temp:  [97.3 F (36.3 C)-102.8 F (39.3 C)] 100.2 F (37.9 C) (12/07 1600) Pulse Rate:  [90-121] 112 (12/07 1600) Resp:  [16-24] 22 (12/07 1600) BP: (95-157)/(40-124) 104/40 (12/07 1600) SpO2:  [97 %-100 %] 99 % (  12/07 1600) FiO2 (%):  [30 %] 30 % (12/07 1600) Weight:  [72.4 kg] 72.4 kg (12/07 0450)  Hemodynamic parameters for last 24 hours:    Intake/Output from previous day: 12/06 0701 - 12/07 0700 In: 1904.3 [I.V.:1900.5; IV Piggyback:3.8] Out: 4445 [Urine:3140]  Intake/Output this shift: Total I/O In: 552.2 [I.V.:506; IV Piggyback:46.3] Out: 825 [Urine:825]  Vent settings for last 24 hours: Vent Mode: PRVC FiO2 (%):  [30 %] 30 % Set Rate:  [16 bmp] 16 bmp Vt Set:  [390 mL] 390 mL PEEP:  [5 cmH20] 5 cmH20 Plateau Pressure:  [14 cmH20] 14 cmH20   Review of Systems:  Gen:  Lethargic but arousable HEENT: Denies blurred vision, double vision, ear pain, eye pain, hearing loss, nose bleeds, sore throat Cardiac:  No dizziness, chest pain or heaviness, chest tightness,edema, No JVD Resp:   No cough, -sputum production, -shortness of breath,-wheezing, -hemoptysis,  Other:  All other systems negative       GENERAL:critically ill appearing, s/p trach HEAD: Normocephalic, atraumatic.  EYES: Pupils equal, round, reactive to light.  No scleral icterus.  MOUTH: Moist mucosal membrane. NECK: s/p trach PULMONARY: +rhonchi CARDIOVASCULAR: S1 and S2. Regular rate and rhythm. GASTROINTESTINAL: tube in place MUSCULOSKELETAL: No swelling, clubbing, or  edema.  NEUROLOGIC: lethargic but arousable SKIN:intact,warm,dry   ALL OTHER ROS ARE NEGATIVE    Assessment/Plan:  79 yo female with 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 presenting with AMS and Acute hypoxic & hypercarbic respiratory failure requiring mechanical ventilation admitted to the ICU. Noncompliant with home nocturnal vent for months, signs and status post gastrostomy tube.  Acute Hypoxic & Hypercapnic Respiratory Failure  Hypercapnia due to severe muscle weakness -needs vent to survive  Acute encephalopathy due to hypercapnea -Needs Vent support  Patient is deciding on how long she would want to live on vent    GI GI PROPHYLAXIS as indicated  NUTRITIONAL STATUS DIET-->TF's as tolerated Constipation protocol as indicated  ELECTROLYTES -follow labs as needed -replace as needed -pharmacy consultation and following   DVT/GI PRX ordered and assessed TRANSFUSIONS AS NEEDED MONITOR FSBS I Assessed the need for Labs I Assessed the need for Foley I Assessed the need for Central Venous Line Family Discussion when available I Assessed the need for Mobilization I made an Assessment of medications to be adjusted accordingly Safety Risk assessment completed  CASE DISCUSSED IN MULTIDISCIPLINARY ROUNDS WITH ICU TEAM      Critical Care Time devoted to patient care services described in this note is 35  minutes.   Overall, patient is critically ill, prognosis is guarded.     Corrin Parker, M.D.  [facility]  Medical Director [facility] Director [facility] Cardio-Pulmonary Department

## 2020-02-02 NOTE — Progress Notes (Signed)
[facility] ADULT ICU REPLACEMENT PROTOCOL   The patient does apply for the [facility] Adult ICU Electrolyte Replacment Protocol based on the criteria listed below:   1. Is GFR >/= 30 ml/min? Yes.    Patient's GFR today is >60 2. Is SCr </= 2? Yes.   Patient's SCr is 0.73 ml/kg/hr 3. Did SCr increase >/= 0.5 in 24 hours? No. 4. Abnormal electrolyte(s): Mg 1.8 5. Ordered repletion with: protocol 6. If a panic level lab has been reported, has the CCM MD in charge been notified? No..   Physician:    Ronda Fairly A 02/02/2020 6:24 AM

## 2020-02-03 LAB — BASIC METABOLIC PANEL
Anion gap: 9 (ref 5–15)
BUN: 15 mg/dL (ref 8–23)
CO2: 30 mmol/L (ref 22–32)
Calcium: 9.6 mg/dL (ref 8.9–10.3)
Chloride: 98 mmol/L (ref 98–111)
Creatinine, Ser: 0.68 mg/dL (ref 0.44–1.00)
GFR, Estimated: >60 mL/min (ref >60)
Glucose, Bld: 236 mg/dL — ABNORMAL HIGH (ref 70–99)
Potassium: 4.7 mmol/L (ref 3.5–5.1)
Sodium: 137 mmol/L (ref 135–145)

## 2020-02-03 LAB — CBC WITH DIFFERENTIAL/PLATELET
Abs Immature Granulocytes: 0.09 10*3/uL — ABNORMAL HIGH (ref 0.00–0.07)
Basophils Absolute: 0 10*3/uL (ref 0.0–0.1)
Basophils Relative: 0 %
Eosinophils Absolute: 0.2 10*3/uL (ref 0.0–0.5)
Eosinophils Relative: 2 %
HCT: 30.7 % — ABNORMAL LOW (ref 36.0–46.0)
Hemoglobin: 9.3 g/dL — ABNORMAL LOW (ref 12.0–15.0)
Immature Granulocytes: 1 %
Lymphocytes Relative: 8 %
Lymphs Abs: 0.8 10*3/uL (ref 0.7–4.0)
MCH: 25.8 pg — ABNORMAL LOW (ref 26.0–34.0)
MCHC: 30.3 g/dL (ref 30.0–36.0)
MCV: 85.3 fL (ref 80.0–100.0)
Monocytes Absolute: 0.7 10*3/uL (ref 0.1–1.0)
Monocytes Relative: 7 %
Neutro Abs: 8.4 10*3/uL — ABNORMAL HIGH (ref 1.7–7.7)
Neutrophils Relative %: 82 %
Platelets: 157 10*3/uL (ref 150–400)
RBC: 3.6 MIL/uL — ABNORMAL LOW (ref 3.87–5.11)
RDW: 13.8 % (ref 11.5–15.5)
WBC: 10.3 10*3/uL (ref 4.0–10.5)
nRBC: 0 % (ref 0.0–0.2)

## 2020-02-03 LAB — GLUCOSE, CAPILLARY
Glucose-Capillary: 149 mg/dL — ABNORMAL HIGH (ref 70–99)
Glucose-Capillary: 157 mg/dL — ABNORMAL HIGH (ref 70–99)
Glucose-Capillary: 191 mg/dL — ABNORMAL HIGH (ref 70–99)
Glucose-Capillary: 194 mg/dL — ABNORMAL HIGH (ref 70–99)
Glucose-Capillary: 197 mg/dL — ABNORMAL HIGH (ref 70–99)
Glucose-Capillary: 214 mg/dL — ABNORMAL HIGH (ref 70–99)

## 2020-02-03 LAB — BLOOD GAS, ARTERIAL
Acid-Base Excess: 10.4 mmol/L — ABNORMAL HIGH (ref 0.0–2.0)
Bicarbonate: 36 mmol/L — ABNORMAL HIGH (ref 20.0–28.0)
FIO2: 0.3
MECHVT: 390 mL
O2 Saturation: 97.4 %
PEEP: 5 cmH2O
Patient temperature: 37
RATE: 16 resp/min
pCO2 arterial: 53 mmHg — ABNORMAL HIGH (ref 32.0–48.0)
pH, Arterial: 7.44 (ref 7.350–7.450)
pO2, Arterial: 92 mmHg (ref 83.0–108.0)

## 2020-02-03 LAB — MAGNESIUM: Magnesium: 1.7 mg/dL (ref 1.7–2.4)

## 2020-02-03 LAB — PHOSPHORUS: Phosphorus: 3.9 mg/dL (ref 2.5–4.6)

## 2020-02-03 MED ORDER — GLUCERNA 1.5 CAL PO LIQD
1000.0000 mL | ORAL | Status: DC
  Administered 2020-02-03 – 2020-02-13 (×7): 1000 mL

## 2020-02-03 MED ORDER — POLYVINYL ALCOHOL 1.4 % OP SOLN
1.0000 [drp] | OPHTHALMIC | Status: DC | PRN
  Administered 2020-02-03: 1 [drp] via OPHTHALMIC
  Filled 2020-02-03: qty 15

## 2020-02-03 MED ORDER — PANTOPRAZOLE SODIUM 40 MG PO PACK
40.0000 mg | PACK | Freq: Every day | ORAL | Status: DC
  Administered 2020-02-03 – 2020-02-12 (×10): 40 mg
  Filled 2020-02-03 (×10): qty 20

## 2020-02-03 MED ORDER — INSULIN ASPART 100 UNIT/ML ~~LOC~~ SOLN
0.0000 [IU] | SUBCUTANEOUS | Status: DC
  Administered 2020-02-03 (×3): 3 [IU] via SUBCUTANEOUS
  Administered 2020-02-03: 2 [IU] via SUBCUTANEOUS
  Administered 2020-02-04: 3 [IU] via SUBCUTANEOUS
  Administered 2020-02-04 (×2): 2 [IU] via SUBCUTANEOUS
  Administered 2020-02-04 – 2020-02-05 (×3): 3 [IU] via SUBCUTANEOUS
  Administered 2020-02-05 (×4): 2 [IU] via SUBCUTANEOUS
  Administered 2020-02-05: 3 [IU] via SUBCUTANEOUS
  Administered 2020-02-06: 2 [IU] via SUBCUTANEOUS
  Administered 2020-02-06: 3 [IU] via SUBCUTANEOUS
  Administered 2020-02-06: 2 [IU] via SUBCUTANEOUS
  Administered 2020-02-06 (×3): 3 [IU] via SUBCUTANEOUS
  Administered 2020-02-07 (×3): 2 [IU] via SUBCUTANEOUS
  Administered 2020-02-07 (×3): 3 [IU] via SUBCUTANEOUS
  Administered 2020-02-08: 2 [IU] via SUBCUTANEOUS
  Administered 2020-02-08 (×2): 3 [IU] via SUBCUTANEOUS
  Administered 2020-02-08: 2 [IU] via SUBCUTANEOUS
  Administered 2020-02-08 (×2): 3 [IU] via SUBCUTANEOUS
  Administered 2020-02-09: 2 [IU] via SUBCUTANEOUS
  Administered 2020-02-09 (×3): 3 [IU] via SUBCUTANEOUS
  Administered 2020-02-09: 2 [IU] via SUBCUTANEOUS
  Administered 2020-02-09: 3 [IU] via SUBCUTANEOUS
  Administered 2020-02-10: 2 [IU] via SUBCUTANEOUS
  Administered 2020-02-10: 3 [IU] via SUBCUTANEOUS
  Administered 2020-02-10 (×5): 2 [IU] via SUBCUTANEOUS
  Administered 2020-02-11 (×2): 3 [IU] via SUBCUTANEOUS
  Administered 2020-02-11 – 2020-02-12 (×4): 2 [IU] via SUBCUTANEOUS
  Administered 2020-02-12 (×3): 3 [IU] via SUBCUTANEOUS
  Administered 2020-02-12: 2 [IU] via SUBCUTANEOUS
  Administered 2020-02-13: 3 [IU] via SUBCUTANEOUS
  Administered 2020-02-13: 2 [IU] via SUBCUTANEOUS
  Administered 2020-02-13: 3 [IU] via SUBCUTANEOUS
  Filled 2020-02-03 (×54): qty 1

## 2020-02-03 NOTE — Progress Notes (Signed)
Daily Progress Note   Patient Name: Angela David       Date: 02/03/2020 DOB: 21-Apr-1940  Age: 79 y.o. MRN#: [id number] Attending Physician: Flora Lipps, MD Primary Care Physician: Iva Boop, MD Admit Date: 01/23/2020  Reason for Consultation/Follow-up: Establishing goals of care  Subjective: Patient is resting in bed on ventilator. She is alert and oriented. She states husband Angela David has came to see her, but has not been present long enough to have a conversation about planning.  We discussed a ventilator facility, and she states she would only want a facility near by, and one like her ICU room, but  would really rather be home with Angela David. We discussed QOL. She states her current QOL is not acceptable, and she would not want to live on the ventilator long term, but cannot make a decision to stop ventilator support either. She feels she will just make the decision when the time comes. After discussion, she states she has considered her options, and once optimized, she wants to go home on 24/7 ventilator support if needed; she wants to be with Angela David.   Length of Stay: 11  Current Medications: Scheduled Meds:  . albuterol  2.5 mg Nebulization BID  . allopurinol  100 mg Per NG tube Daily  . busPIRone  30 mg Per NG tube BID  . chlorhexidine gluconate (MEDLINE KIT)  15 mL Mouth Rinse BID  . Chlorhexidine Gluconate Cloth  6 each Topical Daily  . enoxaparin (LOVENOX) injection  40 mg Subcutaneous Q24H  . escitalopram  30 mg Per Tube Daily  . feeding supplement (PROSource TF)  45 mL Per Tube TID  . folic acid  1 mg Per Tube Daily  . free water  100 mL Per Tube Q6H  . gabapentin  600 mg Per Tube Q8H  . insulin aspart  0-15 Units Subcutaneous Q4H  . insulin glargine  10 Units  Subcutaneous QHS  . mouth rinse  15 mL Mouth Rinse 10 times per day  . melatonin  5 mg Per Tube QHS  . multivitamin with minerals  1 tablet Per Tube Daily  . pantoprazole sodium  40 mg Per Tube QHS  . pravastatin  20 mg Per NG tube q1800  . rOPINIRole  0.5 mg Per Tube QHS    Continuous Infusions: .  sodium chloride Stopped (02/01/20 0110)  . sodium chloride Stopped (02/02/20 1402)  . feeding supplement (OSMOLITE 1.5 CAL) 45 mL/hr at 02/03/20 0600    PRN Meds: acetaminophen, acetaminophen, fentaNYL (SUBLIMAZE) injection, ondansetron (ZOFRAN) IV, polyvinyl alcohol  Physical Exam Pulmonary:     Comments: On ventilator.  Skin:    General: Skin is warm and dry.  Neurological:     Mental Status: She is alert.             Vital Signs: BP 111/73   Pulse (!) 109   Temp 98.9 F (37.2 C) (Oral)   Resp (!) 21   Ht 5' 4.02" (1.626 m)   Wt 70.6 kg   SpO2 97%   BMI 26.70 kg/m  SpO2: SpO2: 97 % O2 Device: O2 Device: Ventilator O2 Flow Rate: O2 Flow Rate (L/min): 5 L/min  Intake/output summary:   Intake/Output Summary (Last 24 hours) at 02/03/2020 1453 Last data filed at 02/03/2020 1214 Gross per 24 hour  Intake 4169.67 ml  Output 3025 ml  Net 1144.67 ml   LBM: Last BM Date: 02/03/20 Baseline Weight: Weight: 74.6 kg Most recent weight: Weight: 70.6 kg     Patient Active Problem List   Diagnosis Date Noted  . Chronic diastolic CHF (congestive heart failure) ([place]) 01/31/2020  . Uncontrolled type 2 diabetes mellitus with hyperglycemia ([place]) 01/31/2020  . AF (paroxysmal atrial fibrillation) ([place]) 01/31/2020  . Aspiration pneumonia of both lower lobes due to gastric secretions ([place]) 01/28/2020  . Acute metabolic encephalopathy 68/34/1962  . Acute respiratory failure ([place]) 01/23/2020  . Left lower lobe pneumonia 01/16/2020  . 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 Drug-induced polyneuropathy ([place]) 05/05/2019  . 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[place]) 05/05/2019   . Weakness   . Hypoxia   . Acute respiratory failure with hypoxia and hypercapnia ([place]) 03/21/2019  . Hypotension 03/21/2019  . Acute encephalopathy 03/21/2019  . Multiple fractures of ribs, right side, initial encounter for closed fracture 12/25/2018  . Colon polyp 11/06/2018  . Esophageal dysphagia   . 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 Stricture and stenosis of esophagus   . Elevated CEA 09/02/2018  . Goals of care, counseling/discussion 09/02/2018  . Polyp of transverse colon 09/02/2018  . Foreign body in right foot 05/26/2018  . History of tracheostomy 04/30/2018  . Moderate episode of recurrent major depressive disorder ([place]) 04/03/2018  . Poor balance 04/03/2018  . Shoulder lesion, right 02/28/2017  . Cervical radiculopathy 09/28/2016  . Peripheral neuropathy 08/28/2016  . Diabetic peripheral neuropathy ([place]) 08/28/2016  . Leg weakness, bilateral 08/13/2016  . Gait abnormality 08/13/2016  . Carcinoma of overlapping sites of right breast in female, estrogen receptor negative ([place]) 12/27/2015  . Malignant neoplasm of breast ([place]) 03/28/2015  . 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 Hypercholesterolemia 03/28/2015  . Recurrent major depressive disorder, in partial remission ([place]) 03/01/2015  . 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 Cephalalgia 09/15/2014  . Headache due to trauma 09/15/2014  . Osteopenia 01/07/2014  . Age-related osteoporosis without current pathological fracture 01/07/2014  . History of colonic polyps 12/14/2013  . History of breast cancer 11/26/2012  . H/O malignant neoplasm of breast 11/26/2012  . 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 Tracheostomy present ([place]) 12/06/2011  . Calcification of bronchus or trachea 01/16/2011  . 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 Anxiety state 12/19/2010  . Blood in sputum 12/19/2010  . HLD (hyperlipidemia) 12/19/2010  . Type 2 diabetes mellitus ([place]) 12/19/2010  Palliative Care  Assessment & Plan    Recommendations/Plan:  On ventilator. When medically optimized/ ready for D/C outside of her ventilator support, she wants to go home on her ventilator to be with Angela David and home health and palliative to follow.     Code Status:    Code Status Orders  (From admission, onward)         Start     Ordered   01/26/20 1058  Limited resuscitation (code)  Continuous       Question Answer Comment  In the event of cardiac or respiratory ARREST: Initiate Code Blue, Call Rapid Response Yes   In the event of cardiac or respiratory ARREST: Perform CPR No   In the event of cardiac or respiratory ARREST: Perform Intubation/Mechanical Ventilation Yes   In the event of cardiac or respiratory ARREST: Use NIPPV/BiPAp only if indicated Yes   In the event of cardiac or respiratory ARREST: Administer ACLS medications if indicated Yes   In the event of cardiac or respiratory ARREST: Perform Defibrillation or Cardioversion if indicated Yes   Comments MOST form on chart. If she is found not breathing or with no heartbeat, she wishes to be left that way with no intervention.      01/26/20 1058        Code Status History    Date Active Date Inactive Code Status Order ID Comments User Context   01/23/2020 2152 01/26/2020 1058 Full Code [id number]  Rust[place], Huel Cote, NP ED   01/15/2020 2239 01/21/2020 2131 Full Code [id number]  CoxBriant Cedar, DO ED   03/21/2019 0108 04/01/2019 2227 Full Code [id number]  Awilda Bill, NP ED   12/26/2018 0336 12/31/2018 2219 Full Code [id number]  Tylene Fantasia, PA-C ED   11/06/2018 1233 11/09/2018 1917 Full Code [id number]  Jules Husbands, MD Inpatient   Advance Care Planning Activity       Prognosis:   Unable to determine    Care plan was discussed with RN   Thank you for allowing the Palliative Medicine Team to assist in the care of this patient.   Total Time 35 min Prolonged Time Billed  no      Greater than 50%  of this time was spent  counseling and coordinating care related to the above assessment and plan.  Asencion Gowda, NP  Please contact Palliative Medicine Team phone at [phone] for questions and concerns.

## 2020-02-03 NOTE — Progress Notes (Signed)
Pt alert, follows commands. Tracheostomy and vent intact. Pt appears symmetrical. Equal grips and dorsiflexion. Tylenol administered for R abdominal pain where bruise is located. Physical therapy ordered. VSS. Will continue to monitor.

## 2020-02-03 NOTE — Progress Notes (Signed)
Inpatient Diabetes Program Recommendations  AACE/ADA: New Consensus Statement on Inpatient Glycemic Control (2015)  Target Ranges:  Prepandial:   less than 140 mg/dL      Peak postprandial:   less than 180 mg/dL (1-2 hours)      Critically ill patients:  140 - 180 mg/dL   Lab Results  Component Value Date   GLUCAP 194 (H) 02/03/2020   HGBA1C 6.3 (H) 01/20/2020    Review of Glycemic Control Results for Angela David, Angela David (MRN [id number]) as of 02/03/2020 10:19  Ref. Range 02/02/2020 19:28 02/02/2020 23:31 02/03/2020 03:28 02/03/2020 07:39  Glucose-Capillary Latest Ref Range: 70 - 99 mg/dL 167 (H) 195 (H) 197 (H) 194 (H)    Current orders for Inpatient glycemic control:  Novolog sensitive q 4 hours Osmolite 45 cc/hr Lantus 10 units q HS  Inpatient Diabetes Program Recommendations:    May consider increasing Novolog correction to moderate q 4 hours while on tube feeds.    Thanks  Adah Perl, RN, BC-ADM Inpatient Diabetes Coordinator Pager [phone] (8a-5p)

## 2020-02-03 NOTE — Progress Notes (Signed)
Nutrition Follow-up  DOCUMENTATION CODES:   Not applicable  INTERVENTION:  Initiate Glucerna 1.5 Cal at 50 mL/hr (1200 mL goal daily volume) via G-tube. Provides 1800 kcal, 99 grams of protein, 19 grams of fiber, 912 mL H2O daily.   With free water flush of 100 mL Q6hrs patient receives a total of 1312 mL H2O daily. Patient also receives water flushes with medications.  NUTRITION DIAGNOSIS:   Inadequate oral intake related to inability to eat as evidenced by NPO status.  Ongoing.  GOAL:   Patient will meet greater than or equal to 90% of their needs  Met with TF regimen.  MONITOR:   Labs, Weight trends, TF tolerance, Skin, I & O's  REASON FOR ASSESSMENT:   Ventilator, Consult Enteral/tube feeding initiation and management  ASSESSMENT:   79 year old female with PMHx of breast cancer s/p lumpectomy and chemo/XRT, DM, diabetic peripheral neuropathy, GERD, vocal cord paralysis, tracheal stenosis requiring chronic tracheostomy, recent admission 11/19-11/25 admitted with acute hypoxic and hypercapnic respiratory failure secondary to suspected aspiration PNA, acute encephalopathy.  11/27 placed on ventilator support via tracheostomy 11/28 adult TF protocol initiated 11/29 transitioned to trach collar 12/3 s/p robotic assisted laparoscopic G-tube placement with lysis of adhesions 12/5 transferred to ICU after rapid response and placed back on ventilator  Patient is currently on ventilator support via tracheostomy MV: 6.5 L/min Temp (24hrs), Avg:99.3 F (37.4 C), Min:98.9 F (37.2 C), Max:99.6 F (37.6 C)  Medications reviewed and include: folic acid 1 mg daily per tube, free water 100 mL Q6hrs, Novolog 0-15 units Q4hrs (received 16 units past 24 hrs), Lantus 10 units QHS, MVI daily, Protonix.  Labs reviewed: CBG 191-197.   I/O: 2775 mL UOP yesterday (1.6 mL/kg/hr)  Weight trend: 70.6 kg on 12/8; -4 kg from 12/3  Enteral Access: 16 Fr. Angelica Ran placed 12/3 by  surgery  Discussed with RN and on rounds this AM. Patient met with palliative again today. Patient does not want to liver on ventilator long-term but is not ready to make decision to stop support. Plan is to discharge home with 24/7 ventilator support. RD will switch patient to Glucerna as it is higher in protein and should help with glycemic control and simplifying home TF regimen.  Diet Order:   Diet Order            Diet NPO time specified Except for: Other (See Comments)  Diet effective now                EDUCATION NEEDS:   No education needs have been identified at this time  Skin:  Skin Assessment: Skin Integrity Issues: Skin Integrity Issues:: Incisions Incisions: closed incision to abdomen  Last BM:  02/03/2020 - smear type 6  Height:   Ht Readings from Last 1 Encounters:  02/03/20 5' 4.02" (1.626 m)   Weight:   Wt Readings from Last 1 Encounters:  02/03/20 70.6 kg   Ideal Body Weight:  54.5 kg  BMI:  Body mass index is 26.7 kg/m.  Estimated Nutritional Needs:   Kcal:  1600-1800kcal/day  Protein:  95-110 grams  Fluid:  1.6L/day  Angela Barnacle, MS, RD, LDN Pager number available on Amion

## 2020-02-03 NOTE — Progress Notes (Signed)
PHARMACIST - PHYSICIAN COMMUNICATION  CONCERNING: IV to Oral Route Change Policy  RECOMMENDATION: This patient is receiving pantoprazole by the intravenous route.  Based on criteria approved by the Pharmacy and Therapeutics Committee, the intravenous medication(s) is/are being converted to the equivalent oral dose form(s).   DESCRIPTION: These criteria include:  The patient is eating (either orally or via tube) and/or has been taking other orally administered medications for a least 24 hours  The patient has no evidence of active gastrointestinal bleeding or impaired GI absorption (gastrectomy, short bowel, patient on TNA or NPO).  If you have questions about this conversion, please contact the Pharmacy Department  [x]   [phone] )  [place], [facility] 02/03/2020 9:31 AM

## 2020-02-04 LAB — BASIC METABOLIC PANEL
Anion gap: 8 (ref 5–15)
BUN: 19 mg/dL (ref 8–23)
CO2: 33 mmol/L — ABNORMAL HIGH (ref 22–32)
Calcium: 9.8 mg/dL (ref 8.9–10.3)
Chloride: 95 mmol/L — ABNORMAL LOW (ref 98–111)
Creatinine, Ser: 0.73 mg/dL (ref 0.44–1.00)
GFR, Estimated: >60 mL/min (ref >60)
Glucose, Bld: 162 mg/dL — ABNORMAL HIGH (ref 70–99)
Potassium: 4.6 mmol/L (ref 3.5–5.1)
Sodium: 136 mmol/L (ref 135–145)

## 2020-02-04 LAB — GLUCOSE, CAPILLARY
Glucose-Capillary: 130 mg/dL — ABNORMAL HIGH (ref 70–99)
Glucose-Capillary: 137 mg/dL — ABNORMAL HIGH (ref 70–99)
Glucose-Capillary: 143 mg/dL — ABNORMAL HIGH (ref 70–99)
Glucose-Capillary: 148 mg/dL — ABNORMAL HIGH (ref 70–99)
Glucose-Capillary: 161 mg/dL — ABNORMAL HIGH (ref 70–99)
Glucose-Capillary: 161 mg/dL — ABNORMAL HIGH (ref 70–99)
Glucose-Capillary: 181 mg/dL — ABNORMAL HIGH (ref 70–99)

## 2020-02-04 LAB — ACETYLCHOLINE RECEPTOR AB, ALL
Acety choline binding ab: <0.03 nmol/L (ref 0.00–0.24)
Acetylchol Block Ab: 14 % (ref 0–25)

## 2020-02-04 LAB — CBC WITH DIFFERENTIAL/PLATELET
Abs Immature Granulocytes: 0.07 10*3/uL (ref 0.00–0.07)
Basophils Absolute: 0 10*3/uL (ref 0.0–0.1)
Basophils Relative: 0 %
Eosinophils Absolute: 0.3 10*3/uL (ref 0.0–0.5)
Eosinophils Relative: 3 %
HCT: 30.6 % — ABNORMAL LOW (ref 36.0–46.0)
Hemoglobin: 9.3 g/dL — ABNORMAL LOW (ref 12.0–15.0)
Immature Granulocytes: 1 %
Lymphocytes Relative: 13 %
Lymphs Abs: 1.2 10*3/uL (ref 0.7–4.0)
MCH: 25.8 pg — ABNORMAL LOW (ref 26.0–34.0)
MCHC: 30.4 g/dL (ref 30.0–36.0)
MCV: 85 fL (ref 80.0–100.0)
Monocytes Absolute: 0.6 10*3/uL (ref 0.1–1.0)
Monocytes Relative: 6 %
Neutro Abs: 7.3 10*3/uL (ref 1.7–7.7)
Neutrophils Relative %: 77 %
Platelets: 186 10*3/uL (ref 150–400)
RBC: 3.6 MIL/uL — ABNORMAL LOW (ref 3.87–5.11)
RDW: 13.5 % (ref 11.5–15.5)
WBC: 9.5 10*3/uL (ref 4.0–10.5)
nRBC: 0 % (ref 0.0–0.2)

## 2020-02-04 LAB — MAGNESIUM: Magnesium: 1.8 mg/dL (ref 1.7–2.4)

## 2020-02-04 LAB — PHOSPHORUS: Phosphorus: 4 mg/dL (ref 2.5–4.6)

## 2020-02-04 NOTE — Progress Notes (Signed)
[place] visited pt. while rounding on ICU, as follow-up from prior visits last week, per request of palliative care NP.  Pt. lying in bed on vent via trach, glad to see [place].  Pt. shared that discussions w/palliative care team re: end of life decisions have been stressful for her: 'I came to the hospital to get well, not to die.'  Pt. expresses that she is not afraid to die, but is waiting for 'The Lord to take me.'  [place] explored pt.'s sense of her own prognosis and her feelings re: need for full-time vent support at home; pt. says she would like to go to rehab if this might help her wean off full-time vent support, but that she would not like to do this if the facility was far from her husband [name].  Pt.'s main goal at this point seems to be to go home to be w/husband.  [place] asked pt. if vent is uncomfortable --> pt. says she has gotten used to it.  Pt. also says she is amenable to continued tube feeds; pt. seems to have accepted that solid foods are not an option at this time.  CH offered prayer for pt. at conclusion of visit.  CH remains available as needed.

## 2020-02-04 NOTE — Progress Notes (Signed)
Good afternoon. Remained on trach.collar at 35% most of the afternoon. ETCO2 remained in the 40s throughout. Wakes up pleasant but this RN thinks she is still somewhat confused. Never stays awake long enough to carry on conversation. Husband very nervous and upset when around patient. Husband stays a small amount of time. VSS.

## 2020-02-04 NOTE — Progress Notes (Addendum)
Daily Progress Note   Patient Name: Angela David       Date: 02/04/2020 DOB: [birth year]  Age: 79 y.o. MRN#: [id number] Attending Physician: Flora Lipps, MD Primary Care Physician: Iva Boop, MD Admit Date: 01/23/2020  Reason for Consultation/Follow-up: Establishing goals of care and Ethics  Subjective: Patient is resting in bed. Husband is at bedside. Discussed goals of going home with ventilator support. CCM in to speak with patient/husband as well during part of my meeting. Discussed plans to hopefully wean patient from day time ventilator support if possible. They are aware she will require night time vent support. They are aware she will require tube feeds and is strictly NPO. Husband asks questions about expected functional status at home. Discussed changes to QOL at home. She states she is not happy with her QOL, but is not ready to die. She has difficulty with deciding next steps.  Stepped out with patient's husband. He discusses their financial struggles and is unsure how they will pay for her medical care. He states he is having to learn to manage all the bills as this has been something she does. He discusses that he does not want her to die, but does not want her to suffer either. He states he feels she holds hope she will bounce back and improve as she has in the past. We discussed his concerns for her QOL with being NPO and vent dependent. He discusses his inability to eat red meat and how this came about. He is worried about what life will look like at home for both of them. We discuss a time in which she may decide she wants to shift her focus to comfort and what that could look like. [name] sates he is afraid of death. He states they are people of faith, and they know  where they are going but are afraid to take the actual "leap". He discusses that a plan to shift to comfort care would be doing something to take her life. Discussed the difference between taking someone's life, and allowing a natural death. He discusses the specifics or various other family members who have died and decisions made. We discuss multiple scenarios including ventilator facility placement, comfort care at home, and hospice facility care. Plans to attempt to wean from daytime vent and determine functional  status over the weekend. Plans to initiate bolus tube feeds.   Husband states she has an appointment scheduled with [facility] on January 29th to look into her weakness as he read about symptoms of MS in a magazine and wondered if she has it, or another neurologic process as a cause for her weakness.  Will revisit early next week on my return.    Length of Stay: 12  Current Medications: Scheduled Meds:  . albuterol  2.5 mg Nebulization BID  . allopurinol  100 mg Per NG tube Daily  . busPIRone  30 mg Per NG tube BID  . chlorhexidine gluconate (MEDLINE KIT)  15 mL Mouth Rinse BID  . Chlorhexidine Gluconate Cloth  6 each Topical Daily  . enoxaparin (LOVENOX) injection  40 mg Subcutaneous Q24H  . escitalopram  30 mg Per Tube Daily  . folic acid  1 mg Per Tube Daily  . free water  100 mL Per Tube Q6H  . gabapentin  600 mg Per Tube Q8H  . insulin aspart  0-15 Units Subcutaneous Q4H  . insulin glargine  10 Units Subcutaneous QHS  . mouth rinse  15 mL Mouth Rinse 10 times per day  . melatonin  5 mg Per Tube QHS  . multivitamin with minerals  1 tablet Per Tube Daily  . pantoprazole sodium  40 mg Per Tube QHS  . pravastatin  20 mg Per NG tube q1800  . rOPINIRole  0.5 mg Per Tube QHS    Continuous Infusions: . sodium chloride Stopped (02/01/20 0110)  . sodium chloride Stopped (02/02/20 1402)  . feeding supplement (GLUCERNA 1.5 CAL) 50 mL/hr at 02/04/20 1000    PRN  Meds: acetaminophen, acetaminophen, fentaNYL (SUBLIMAZE) injection, ondansetron (ZOFRAN) IV, polyvinyl alcohol  Physical Exam Pulmonary:     Comments: Ventilator support.  Neurological:     Mental Status: She is alert.             Vital Signs: BP (!) 110/92   Pulse 91   Temp 98.4 F (36.9 C) (Oral)   Resp 14   Ht 5' 4.02" (1.626 m)   Wt 70.5 kg   SpO2 99%   BMI 26.67 kg/m  SpO2: SpO2: 99 % O2 Device: O2 Device: (S) Tracheostomy Collar O2 Flow Rate: O2 Flow Rate (L/min): 10 L/min  Intake/output summary:   Intake/Output Summary (Last 24 hours) at 02/04/2020 1350 Last data filed at 02/04/2020 1000 Gross per 24 hour  Intake 2104.41 ml  Output 1450 ml  Net 654.41 ml   LBM: Last BM Date: 02/04/20 Baseline Weight: Weight: 74.6 kg Most recent weight: Weight: 70.5 kg       Palliative Assessment/Data:      Patient Active Problem List   Diagnosis Date Noted  . Chronic diastolic CHF (congestive heart failure) ([place]) 01/31/2020  . Uncontrolled type 2 diabetes mellitus with hyperglycemia ([place]) 01/31/2020  . AF (paroxysmal atrial fibrillation) ([place]) 01/31/2020  . Aspiration pneumonia of both lower lobes due to gastric secretions ([place]) 01/28/2020  . Acute metabolic encephalopathy 01/00/7121  . Acute respiratory failure ([place]) 01/23/2020  . Left lower lobe pneumonia 01/16/2020  . 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 Drug-induced polyneuropathy ([place]) 05/05/2019  . 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[place]) 05/05/2019  . Weakness   . Hypoxia   . Acute respiratory failure with hypoxia and hypercapnia ([place]) 03/21/2019  . Hypotension 03/21/2019  . Acute encephalopathy 03/21/2019  . Multiple fractures of ribs, right side, initial encounter for closed fracture 12/25/2018  .  Colon polyp 11/06/2018  . Esophageal dysphagia   . 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 Stricture and stenosis of esophagus   . Elevated CEA 09/02/2018  . Goals of care, counseling/discussion  09/02/2018  . Polyp of transverse colon 09/02/2018  . Foreign body in right foot 05/26/2018  . History of tracheostomy 04/30/2018  . Moderate episode of recurrent major depressive disorder ([place]) 04/03/2018  . Poor balance 04/03/2018  . Shoulder lesion, right 02/28/2017  . Cervical radiculopathy 09/28/2016  . Peripheral neuropathy 08/28/2016  . Diabetic peripheral neuropathy ([place]) 08/28/2016  . Leg weakness, bilateral 08/13/2016  . Gait abnormality 08/13/2016  . Carcinoma of overlapping sites of right breast in female, estrogen receptor negative ([place]) 12/27/2015  . Malignant neoplasm of breast ([place]) 03/28/2015  . 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 Hypercholesterolemia 03/28/2015  . Recurrent major depressive disorder, in partial remission ([place]) 03/01/2015  . 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 Cephalalgia 09/15/2014  . Headache due to trauma 09/15/2014  . Osteopenia 01/07/2014  . Age-related osteoporosis without current pathological fracture 01/07/2014  . History of colonic polyps 12/14/2013  . History of breast cancer 11/26/2012  . H/O malignant neoplasm of breast 11/26/2012  . 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 Tracheostomy present ([place]) 12/06/2011  . Calcification of bronchus or trachea 01/16/2011  . 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 Anxiety state 12/19/2010  . Blood in sputum 12/19/2010  . HLD (hyperlipidemia) 12/19/2010  . Type 2 diabetes mellitus ([place]) 12/19/2010    Palliative Care Assessment & Plan    Recommendations/Plan:  Plans to attempt to wean daytime vent support and determine functional status. Will revisit early next week.   Recommend neurology consult. Patient has an appointment to see [facility] at the end of the month to investigate her weakness.  Husband read about symptoms of MS and suspects this or another neurologic issue as a cause for her weakness.     Code Status:    Code Status Orders  (From  admission, onward)         Start     Ordered   01/26/20 1058  Limited resuscitation (code)  Continuous       Question Answer Comment  In the event of cardiac or respiratory ARREST: Initiate Code Blue, Call Rapid Response Yes   In the event of cardiac or respiratory ARREST: Perform CPR No   In the event of cardiac or respiratory ARREST: Perform Intubation/Mechanical Ventilation Yes   In the event of cardiac or respiratory ARREST: Use NIPPV/BiPAp only if indicated Yes   In the event of cardiac or respiratory ARREST: Administer ACLS medications if indicated Yes   In the event of cardiac or respiratory ARREST: Perform Defibrillation or Cardioversion if indicated Yes   Comments MOST form on chart. If she is found not breathing or with no heartbeat, she wishes to be left that way with no intervention.      01/26/20 1058        Code Status History    Date Active Date Inactive Code Status Order ID Comments User Context   01/23/2020 2152 01/26/2020 1058 Full Code [id number]  Rust-Chester, Huel Cote, NP ED   01/15/2020 2239 01/21/2020 2131 Full Code [id number]  CoxBriant Cedar, DO ED   03/21/2019 0108 04/01/2019 2227 Full Code [id number]  Awilda Bill, NP ED   12/26/2018 0336 12/31/2018 2219 Full Code [id number]  Tylene Fantasia, PA-C ED   11/06/2018 1233 11/09/2018 1917 Full Code [id number]  Jules Husbands, MD Inpatient   Advance Care Planning Activity       Prognosis:   Poor     Care plan was discussed with CCM, and RN. Weakness and neuro consult discussed with CCM NP.   Thank you for allowing the Palliative Medicine Team to assist in the care of this patient.   Time In: 10:00 Time Out: 12:00 Total Time 2  hours Prolonged Time Billed yes       Greater than 50%  of this time was spent counseling and coordinating care related to the above assessment and plan.  Asencion Gowda, NP  Please contact Palliative Medicine Team phone at [phone] for questions and concerns.

## 2020-02-04 NOTE — Progress Notes (Signed)
Remains on Tracheostomy collar since 1200 at 35%. ETCO2 occasionally gets to 52 ,if patient deep breathes level decreases to < 45.

## 2020-02-04 NOTE — Progress Notes (Signed)
1200 Report received from [employer].

## 2020-02-04 NOTE — Progress Notes (Signed)
O2 sats dropped to 87%, suctioned fro large amount of thick white/yellow. Patient continues to stay 87-89%. Increased FIO2 to 40%, call to RT to inform of changes.

## 2020-02-04 NOTE — Progress Notes (Signed)
1800 Now becoming confused. Ask several times for water. Explained that she is temporarily on trach.collar. States she wants to get up and  wait in the corner for PT. States she has been here everyday waiting on PT and they have not come. Patient thinks she can get up and walk around in room. When reminded she cannot get up- states she knows that.

## 2020-02-04 NOTE — Progress Notes (Signed)
Shift summary:  - Patient remains on vent, now in PSV.   - Diarrhea continues this AM.

## 2020-02-04 NOTE — Plan of Care (Signed)

## 2020-02-04 NOTE — Evaluation (Addendum)
Physical Therapy Re-Evaluation Patient Details Name: Angela David MRN: [id number] DOB: 01-13-1941 Today's Date: 02/04/2020   History of Present Illness  Pt is a 79 y/o F her 11/19 with c/o hallucinations x 1 week. Imaging negative. Pt admitted for tx of acute encephalopathy of unclear etiology but suspect due to RLL PNA. (Pt also recently seen in ED on 01/11/20 s/p fall & found to have R clavicular fx & sent home with R shoulder immobilizer, instructed to f/u with ortho on OP basis.) PMH: R breast CA (2002), DM, peripheral neuropathy, glaucoma, high cholesterol, vocal cord paralysis. Rapid response called and pt transferred to CCU, on vent.    Clinical Impression  Patient seen as a re-evaluation; alert, but did intermittently seem to fall asleep during session. Pt PLOF gathered from prior notes, session primarily focused on maximizing strength and mobility. Several supine exercises performed with intermittent cueing to attend to task, no physical assist needed. Second person assist called to ensure safety with mobility and lines/leads management. Supine <> sit modA and pt able to sit EOB ~5 minutes. Posterior lean noted throughout, primarily min-modA to maintain balance with UE support. Several bouts of seated balance with close supervision to maximize core strength and balance.  Overall the patient demonstrated deficits (see "PT Problem List") that impede the patient's functional abilities, safety, and mobility and would benefit from skilled PT intervention. Recommendation is SNF due to current level of assistance needed and to maximize pt function.      Follow Up Recommendations SNF;Supervision/Assistance - 24 hour    Equipment Recommendations  Other (comment) (TBD at next venue of care)    Recommendations for Other Services OT consult     Precautions / Restrictions Precautions Precautions: Fall Precaution Comments: vent Restrictions Other Position/Activity Restrictions: Pt with  findings of R clavicle fx on 01/11/20, sent home with shoulder immobilizer & told to f/u with ortho as outpatient but unable to do this since readmission to hospital. Therapy electing to maintain NWB, no ROM R shoulder      Mobility  Bed Mobility Overal bed mobility: Needs Assistance Bed Mobility: Supine to Sit;Sit to Supine     Supine to sit: Mod assist;+2 for safety/equipment;HOB elevated Sit to supine: Mod assist;+2 for safety/equipment   General bed mobility comments: effortful, HOB elevated, use of bed rails    Transfers                 General transfer comment: deferred due to fatigue/safety concerns  Ambulation/Gait                Stairs            Wheelchair Mobility    Modified Rankin (Stroke Patients Only)       Balance Overall balance assessment: Needs assistance Sitting-balance support: Feet supported;Bilateral upper extremity supported Sitting balance-Leahy Scale: Fair Sitting balance - Comments: posterior lean throughout                                     Pertinent Vitals/Pain Pain Assessment: Faces Faces Pain Scale: Hurts a little bit Pain Location: R shoulder Pain Descriptors / Indicators: Aching;Discomfort;Dull Pain Intervention(s): Limited activity within patient's tolerance;Monitored during session;Premedicated before session;Repositioned    Home Living Family/patient expects to be discharged to:: Skilled nursing facility Living Arrangements: Spouse/significant other Available Help at Discharge: Family;Available 24 hours/day Type of Home: House Home Access: Ramped entrance  Home Layout: Other (Comment) (pt reports split level house, steps once inside) Home Equipment: Walker - 2 wheels;Wheelchair - manual;Cane - single point;Grab bars - toilet;Other (comment)      Prior Function Level of Independence: Independent with assistive device(s)         Comments: Pt reports that she rarely uses SPC, until  3 weeks ago was up and moving around with no AD in the home     Hand Dominance   Dominant Hand: Right    Extremity/Trunk Assessment   Upper Extremity Assessment Upper Extremity Assessment: Generalized weakness;Defer to OT evaluation RUE Deficits / Details: recent clavicle fx, MMT deferred    Lower Extremity Assessment Lower Extremity Assessment: Generalized weakness       Communication   Communication: Tracheostomy;Other (comment) (no PMV, and in addition, difficulty occluding trach noted on this admission more so that previous admissions. Appearance of potential ataxia when pt attempting to find the trach opening to occlude to speak.)  Cognition Arousal/Alertness: Awake/alert Behavior During Therapy: [facility] for tasks assessed/performed Overall Cognitive Status: Within Functional Limits for tasks assessed                                        General Comments General comments (skin integrity, edema, etc.): on vent throughout, RN in room to assist with lines/leads and maintain pt safety    Exercises General Exercises - Lower Extremity Ankle Circles/Pumps: AROM;Both;10 reps Quad Sets: AROM;Both;10 reps Gluteal Sets: AROM;Both;10 reps Heel Slides: AROM;Strengthening;Both;10 reps Hip ABduction/ADduction: AROM;Strengthening;Both;10 reps Other Exercises Other Exercises: seated bouts of maintaining balance with close supervision, able to perform 3x, 20-30sec ea bout.   Assessment/Plan    PT Assessment Patient needs continued PT services  PT Problem List Decreased strength;Decreased balance;Decreased cognition;Decreased knowledge of precautions;Cardiopulmonary status limiting activity;Decreased knowledge of use of DME;Decreased mobility;Decreased range of motion;Decreased safety awareness;Decreased coordination;Decreased activity tolerance       PT Treatment Interventions DME instruction;Functional mobility training;Balance training;Patient/family  education;Modalities;Gait training;Therapeutic activities;Neuromuscular re-education;Wheelchair mobility training;Cognitive remediation;Manual techniques;Therapeutic exercise;Stair training    PT Goals (Current goals can be found in the Care Plan section)  Acute Rehab PT Goals Patient Stated Goal: pt wants to walk PT Goal Formulation: With patient Time For Goal Achievement: 02/18/20 Potential to Achieve Goals: Fair    Frequency Min 2X/week   Barriers to discharge   unsure if family can provide extensive physical assist at discharge    Co-evaluation               AM-PAC PT "6 Clicks" Mobility  Outcome Measure Help needed turning from your back to your side while in a flat bed without using bedrails?: A Lot Help needed moving from lying on your back to sitting on the side of a flat bed without using bedrails?: A Lot Help needed moving to and from a bed to a chair (including a wheelchair)?: A Lot Help needed standing up from a chair using your arms (e.g., wheelchair or bedside chair)?: A Lot Help needed to walk in hospital room?: Total Help needed climbing 3-5 steps with a railing? : Total 6 Click Score: 10    End of Session Equipment Utilized During Treatment:  (pt on vent) Activity Tolerance: Patient tolerated treatment well;Patient limited by fatigue Patient left: in bed;with call bell/phone within reach;with SCD's reapplied Nurse Communication: Mobility status PT Visit Diagnosis: Muscle weakness (generalized) (M62.81);History of falling (Z91.81);Unsteadiness on feet (R26.81);Difficulty in  walking, not elsewhere classified (R26.2)    Time: 1368-5992 PT Time Calculation (min) (ACUTE ONLY): 29 min   Charges:   PT Evaluation $PT Re-evaluation: 1 Re-eval PT Treatments $Therapeutic Exercise: 8-22 mins        Lieutenant Diego PT, DPT 10:10 AM,02/04/20

## 2020-02-05 DIAGNOSIS — G603 Idiopathic progressive neuropathy: Secondary | ICD-10-CM

## 2020-02-05 LAB — BASIC METABOLIC PANEL
Anion gap: 11 (ref 5–15)
BUN: 24 mg/dL — ABNORMAL HIGH (ref 8–23)
CO2: 30 mmol/L (ref 22–32)
Calcium: 10.1 mg/dL (ref 8.9–10.3)
Chloride: 96 mmol/L — ABNORMAL LOW (ref 98–111)
Creatinine, Ser: 0.79 mg/dL (ref 0.44–1.00)
GFR, Estimated: >60 mL/min (ref >60)
Glucose, Bld: 181 mg/dL — ABNORMAL HIGH (ref 70–99)
Potassium: 4.4 mmol/L (ref 3.5–5.1)
Sodium: 137 mmol/L (ref 135–145)

## 2020-02-05 LAB — CBC WITH DIFFERENTIAL/PLATELET
Abs Immature Granulocytes: 0.1 10*3/uL — ABNORMAL HIGH (ref 0.00–0.07)
Basophils Absolute: 0 10*3/uL (ref 0.0–0.1)
Basophils Relative: 0 %
Eosinophils Absolute: 0.2 10*3/uL (ref 0.0–0.5)
Eosinophils Relative: 3 %
HCT: 33 % — ABNORMAL LOW (ref 36.0–46.0)
Hemoglobin: 10.2 g/dL — ABNORMAL LOW (ref 12.0–15.0)
Immature Granulocytes: 1 %
Lymphocytes Relative: 16 %
Lymphs Abs: 1.4 10*3/uL (ref 0.7–4.0)
MCH: 26 pg (ref 26.0–34.0)
MCHC: 30.9 g/dL (ref 30.0–36.0)
MCV: 84.2 fL (ref 80.0–100.0)
Monocytes Absolute: 0.5 10*3/uL (ref 0.1–1.0)
Monocytes Relative: 5 %
Neutro Abs: 6.7 10*3/uL (ref 1.7–7.7)
Neutrophils Relative %: 75 %
Platelets: 217 10*3/uL (ref 150–400)
RBC: 3.92 MIL/uL (ref 3.87–5.11)
RDW: 13.3 % (ref 11.5–15.5)
WBC: 9 10*3/uL (ref 4.0–10.5)
nRBC: 0 % (ref 0.0–0.2)

## 2020-02-05 LAB — GLUCOSE, CAPILLARY
Glucose-Capillary: 139 mg/dL — ABNORMAL HIGH (ref 70–99)
Glucose-Capillary: 182 mg/dL — ABNORMAL HIGH (ref 70–99)
Glucose-Capillary: 159 mg/dL — ABNORMAL HIGH (ref 70–99)
Glucose-Capillary: 138 mg/dL — ABNORMAL HIGH (ref 70–99)

## 2020-02-05 LAB — PHOSPHORUS: Phosphorus: 3.9 mg/dL (ref 2.5–4.6)

## 2020-02-05 LAB — MAGNESIUM: Magnesium: 1.8 mg/dL (ref 1.7–2.4)

## 2020-02-05 MED ORDER — ORAL CARE MOUTH RINSE
15.0000 mL | Freq: Two times a day (BID) | OROMUCOSAL | Status: DC
  Administered 2020-02-05 – 2020-02-13 (×16): 15 mL via OROMUCOSAL

## 2020-02-05 NOTE — TOC Progression Note (Signed)
Transition of Care [facility]) - Progression Note    Patient Details  Name: Angela David MRN: [id number] Date of Birth: 1940-10-09  [facility] CM/SW [place], [place] Phone Number:  [phone] 02/05/2020, 11:58 AM  Clinical Narrative:     CSW spoke with patient and Looper,Joseph (Spouse) [phone] about home health and DME needs after discharge.  Patient and Mr. Comrie are both in agreement about returning home with home health.  They chose [facility] for DME and [facility] for home health.  CSW explained the process and estimated time line for setting up both DME and home health.  CSW stated I would update them as soon as I know if the agencies would be able to provide the DME and home health. Both verbalized understanding.  CSW contacted Thedore Mins with [facility] and Corene Cornea with [facility] and is waiting for confirmation they will take the patient.   Expected Discharge Plan: [place] Barriers to Discharge: Continued Medical Work up  Expected Discharge Plan and Services Expected Discharge Plan: [place] Choice: [place] arrangements for the past 2 months: Single Family Home                                       Social Determinants of Health (SDOH) Interventions    Readmission Risk Interventions No flowsheet data found.

## 2020-02-05 NOTE — Progress Notes (Signed)
OT Cancellation Note  Patient Details Name: Angela David MRN: [id number] DOB: [birth year]   Cancelled Treatment:    Reason Eval/Treat Not Completed: Patient declined, no reason specified. OT order received and chart reviewed. Upon arrival pt alert and smiling, states she is ready to go home but repeatedly declines therapy this date - mouths "maybe Monday." Will follow up at later date/time as able.   Dessie Coma, M.S. OTR/L  02/05/20, 2:16 PM  ascom [phone]

## 2020-02-05 NOTE — Progress Notes (Signed)
CRITICAL CARE NOTE  CC  follow up respiratory failure/vent depenedent  SUBJECTIVE No events overnight. occ intermittent confusion reported by staff No fever reported Nods yes to mild cough. Denies chest pain, abd pain, n/v/d No headaches/seizures  Patient remains vent depenedent    SIGNIFICANT EVENTS  Tolerating TC  BP (!) 128/59   Pulse 94   Temp 98.5 F (36.9 C) (Oral)   Resp 20   Ht 5' 4.02" (1.626 m)   Wt 69.6 kg   SpO2 96%   BMI 26.32 kg/m    REVIEW OF SYSTEMS  PATIENT IS UNABLE TO PROVIDE COMPLETE REVIEW OF SYSTEMS DUE TO SEVERE CRITICAL ILLNESS   PHYSICAL EXAMINATION:  GENERAL:comfortable , NO resp distress on TC HEAD: Normocephalic, atraumatic.  EYES: Pupils equal, round, reactive to light.  No scleral icterus.  MOUTH: Moist mucosal membrane. NECK: Supple. No thyromegaly. No nodules. No JVD.  PULMONARY: no rhonci or wheezing CARDIOVASCULAR: S1 and S2. Regular rate and rhythm. No murmurs, rubs, or gallops.  GASTROINTESTINAL: Soft, nontender, -distended. No masses. Positive bowel sounds. No hepatosplenomegaly.  PET site clean + umblical hernia MUSCULOSKELETAL: No swelling, clubbing, or edema.  NEUROLOGIC: awake and alert, appropiate, moves all extremeties Communicates well with TC SKIN:intact,warm,dry  INTAKE/OUTPUT  Intake/Output Summary (Last 24 hours) at 02/05/2020 1331 Last data filed at 02/05/2020 1200 Gross per 24 hour  Intake 1200 ml  Output 1175 ml  Net 25 ml    LABS  CBC Recent Labs  Lab 02/03/20 0643 02/04/20 0629 02/05/20 0629  WBC 10.3 9.5 9.0  HGB 9.3* 9.3* 10.2*  HCT 30.7* 30.6* 33.0*  PLT 157 186 217   Coag's No results for input(s): APTT, INR in the last 168 hours. BMET Recent Labs  Lab 02/03/20 0643 02/04/20 0629 02/05/20 0629  NA 137 136 137  K 4.7 4.6 4.4  CL 98 95* 96*  CO2 30 33* 30  BUN 15 19 24*  CREATININE 0.68 0.73 0.79  GLUCOSE 236* 162* 181*   Electrolytes Recent Labs  Lab 02/03/20 0643  02/04/20 0629 02/05/20 0629  CALCIUM 9.6 9.8 10.1  MG 1.7 1.8 1.8  PHOS 3.9 4.0 3.9   Sepsis Markers No results for input(s): LATICACIDVEN, PROCALCITON, O2SATVEN in the last 168 hours. ABG Recent Labs  Lab 01/31/20 1634 01/31/20 2102 02/03/20 1055  PHART 7.23* 7.46* 7.44  PCO2ART 85* 44 53*  PO2ART 62* 162* 92   Liver Enzymes No results for input(s): AST, ALT, ALKPHOS, BILITOT, ALBUMIN in the last 168 hours. Cardiac Enzymes No results for input(s): TROPONINI, PROBNP in the last 168 hours. Glucose Recent Labs  Lab 02/04/20 2032 02/04/20 2041 02/04/20 2347 02/05/20 0317 02/05/20 0743 02/05/20 1215  GLUCAP 130* 143* 137* 139* 182* 159*     No results found for this or any previous visit (from the past 240 hour(s)).  MEDICATIONS   Current Facility-Administered Medications:  .  0.9 %  sodium chloride infusion, 250 mL, Intravenous, Continuous, Pabon, Diego F, MD, Stopped at 02/01/20 0110 .  0.9 %  sodium chloride infusion, 250 mL, Intravenous, Continuous, Akingbade, Samuella Cota, MD, Paused at 02/02/20 1402 .  acetaminophen (TYLENOL) suppository 650 mg, 650 mg, Rectal, Q6H PRN, Pabon, Diego F, MD .  acetaminophen (TYLENOL) tablet 650 mg, 650 mg, Per NG tube, Q6H PRN, Flora Lipps, MD, 650 mg at 02/03/20 0846 .  albuterol (PROVENTIL) (2.5 MG/3ML) 0.083% nebulizer solution 2.5 mg, 2.5 mg, Nebulization, BID, Sharen Hones, MD, 2.5 mg at 02/04/20 2014 .  allopurinol (ZYLOPRIM) tablet 100  mg, 100 mg, Per NG tube, Daily, Pabon, Diego F, MD, 100 mg at 02/05/20 0931 .  busPIRone (BUSPAR) tablet 30 mg, 30 mg, Per NG tube, BID, Pabon, Diego F, MD, 30 mg at 02/05/20 0931 .  chlorhexidine gluconate (MEDLINE KIT) (PERIDEX) 0.12 % solution 15 mL, 15 mL, Mouth Rinse, BID, Akingbade, Jimi O, MD, 15 mL at 02/04/20 2128 .  Chlorhexidine Gluconate Cloth 2 % PADS 6 each, 6 each, Topical, Daily, Pabon, [place] F, MD, 6 each at 02/04/20 2128 .  enoxaparin (LOVENOX) injection 40 mg, 40 mg, Subcutaneous,  Q24H, Pabon, Diego F, MD, 40 mg at 02/04/20 2126 .  escitalopram (LEXAPRO) tablet 30 mg, 30 mg, Per Tube, Daily, Pabon, Diego F, MD, 30 mg at 02/05/20 0931 .  feeding supplement (GLUCERNA 1.5 CAL) liquid 1,000 mL, 1,000 mL, Per Tube, Continuous, Kasa, Kurian, MD, Last Rate: 50 mL/hr at 02/05/20 1200, Infusion Verify at 02/05/20 1200 .  fentaNYL (SUBLIMAZE) injection 50 mcg, 50 mcg, Intravenous, Q1H PRN, Tyler Pita, MD .  folic acid (FOLVITE) tablet 1 mg, 1 mg, Per Tube, Daily, Pabon, Diego F, MD, 1 mg at 02/05/20 0931 .  free water 100 mL, 100 mL, Per Tube, Q6H, Sharion Settler, NP, 100 mL at 02/05/20 0518 .  gabapentin (NEURONTIN) 250 MG/5ML solution 600 mg, 600 mg, Per Tube, Q8H, Pabon, Diego F, MD, 600 mg at 02/05/20 0517 .  insulin aspart (novoLOG) injection 0-15 Units, 0-15 Units, Subcutaneous, Q4H, Flora Lipps, MD, 3 Units at 02/05/20 0855 .  insulin glargine (LANTUS) injection 10 Units, 10 Units, Subcutaneous, QHS, Sharen Hones, MD, 10 Units at 02/04/20 2127 .  MEDLINE mouth rinse, 15 mL, Mouth Rinse, q12n4p, Kasa, Kurian, MD .  melatonin tablet 5 mg, 5 mg, Per Tube, QHS, Pabon, Diego F, MD, 5 mg at 02/04/20 2126 .  multivitamin with minerals tablet 1 tablet, 1 tablet, Per Tube, Daily, Pabon, Diego F, MD, 1 tablet at 02/05/20 0931 .  ondansetron (ZOFRAN) injection 4 mg, 4 mg, Intravenous, Q6H PRN, Pabon, Diego F, MD, 4 mg at 01/30/20 1746 .  pantoprazole sodium (PROTONIX) 40 mg/20 mL oral suspension 40 mg, 40 mg, Per Tube, QHS, Benita Gutter, RPH, 40 mg at 02/04/20 2126 .  polyvinyl alcohol (LIQUIFILM TEARS) 1.4 % ophthalmic solution 1 drop, 1 drop, Both Eyes, PRN, Flora Lipps, MD, 1 drop at 02/03/20 1324 .  pravastatin (PRAVACHOL) tablet 20 mg, 20 mg, Per NG tube, q1800, Pabon, Diego F, MD, 20 mg at 02/04/20 1623 .  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 Noncompliant with home nocturnal vent for months, signs and status post gastrostomy tube.  Acute Hypoxic&Hypercapnic Respiratory Failure  Hypercapnia due to severe muscle weakness On vent at night and trying TC during the day, seems to be tolerating it well without resp distress  Acute encephalopathy due to  hypercapnea resolved  Patient and her husband would like her to come home.  The social worker and the other's support staff is trying to arrange for her to go home with nighttime ventilator and daytime trach collar.  Peripheral neuropathy being seen by neurologist as per my discussion with the neurologist he  is planning to order a repeat MRI and laboratory work-up for neuropathy.  He feels that she should have a follow-up with a neuromuscular specialist.  Reportedly has an appointment with a neurologist on 12/29 as per her husband.  Diabetes mellitus continue      LA insulin and sliding scale  GI PROPHYLAXIS as indicated  NUTRITIONAL STATUS DIET-->TF's as tolerated Constipation protocol as indicated  ELECTROLYTES -follow labs as needed -replace as needed -pharmacy consultation and following   DVT/GI PRX ordered and assessed TRANSFUSIONS AS NEEDED MONITOR FSBS I Assessed the need for Labs I Assessed the need for Foley I Assessed the need for Central Venous Line Family Discussion when available I  Assessed the need for Mobilization I made an Assessment of medications to be adjusted accordingly Safety Risk assessment completed  CASE DISCUSSED IN MULTIDISCIPLINARY ROUNDS WITH ICU TEAM    Overall, patient is critically ill, prognosis is guarded.  Patient with Multiorgan failure and at high risk for cardiac arrest and death.   Rosine Door, MD  02-11-20 1:31 PM [facility]

## 2020-02-05 NOTE — Consult Note (Signed)
NEUROLOGY CONSULTATION NOTE   Date of service: February 05, 2020 Patient Name: Angela David MRN:  [id number] DOB:  10-29-1940 Reason for consult: "Assess for neuromuscular disorders" _ _ _   _ __   _ __ _ _  __ __   _ __   __ _  History of Present Illness  Angela David is a 79 y.o. female with PMH significant for remote breast cancer in 2002 s/p chemo and radiation, DM2, peripheral neuropathy, GERD, vocal cord paralysis s/p chronic trach who is in the ICU for mucus plugging.  Most of the history obtained from her husband as patient has a trach and no speech valve. They have seen Dr. Jannifer Franklin in the past for neuropathy. This was back in 2018 for cocnerns for neuromuscular disease. Per notes from Dr. Jannifer Franklin, she was wobbly with walking even back then and was noted to have numbness,, opthalmoplegia and difficulty getting in an out of chair.  Husband reports that they are supposed to follow up but havent been able to as she is here.  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 EMG evaluation of the right lower extremity shows chronic stable signs of denervation below the knee consistent with the diagnosis of peripheral neuropathy. Labs with B12 normal, unremarkable CK, negative RPR, negative Sed rate, negative ACE, negative RF, negative ANA, negative lyme.   ROS   Unable to obtain a detailed ROS as patient is trached and does not have a speech valve. She denies any pain  Past History   Past Medical History:  Diagnosis Date  . Breast cancer ([place]) 2002   right breast chemo and radiation  . Cancer [facility]) 2002   right breast  . Diabetes mellitus without complication ([place])   . Diabetic peripheral neuropathy ([place]) 08/28/2016  . GERD (gastroesophageal reflux disease)   . Glaucoma   . High cholesterol   . History of colon polyps   . Peripheral neuropathy 08/28/2016  . Personal history of  chemotherapy 2002   BREAST CA  . Personal history of malignant neoplasm of breast   . Personal history of radiation therapy 2002   BREAST CA  . Vocal cord paralysis 2012   Past Surgical History:  Procedure Laterality Date  . ABDOMINAL HYSTERECTOMY    . BREAST BIOPSY Right 2008   neg  . BREAST EXCISIONAL BIOPSY Right 2002   positive  . BREAST LUMPECTOMY Right 2002   chemo and radiation  . BREAST SURGERY Right 2002   LUMPECTOMY, radiation, chemotherapy  . CHOLECYSTECTOMY N/A 12/10/2017   Procedure: LAPAROSCOPIC CHOLECYSTECTOMY;  Surgeon: Benjamine Sprague, DO;  Location: [facility] ORS;  Service: General;  Laterality: N/A;  . COLON RESECTION Right 11/06/2018   Procedure: HAND ASSISTED LAPAROSCOPIC COLON RESECTION, RIGHT;  Surgeon: Jules Husbands, MD;  Location: [facility] ORS;  Service: General;  Laterality: Right;  . COLONOSCOPY  2009,12/30/2013  . COLONOSCOPY WITH PROPOFOL N/A 04/16/2018   Tubulovillous polyp proximal transverse colon, large sessile polyp, proximal transverse colon;  Surgeon: Robert Bellow, MD;  Location: [facility] ENDOSCOPY;  Service: Endoscopy;  Laterality: N/A;  . ESOPHAGOGASTRODUODENOSCOPY (EGD) WITH PROPOFOL N/A 10/14/2018   Procedure: ESOPHAGOGASTRODUODENOSCOPY (EGD) WITH PROPOFOL;  Surgeon: Lucilla Lame, MD;  Location: [facility] ENDOSCOPY;  Service: Endoscopy;  Laterality: N/A;  . HYDROCELE EXCISION / REPAIR    . TRACHEAL SURGERY  2013  . TRACHEOSTOMY N/A    Dr Kathyrn Sheriff   Family  History  Problem Relation Age of Onset  . Cancer Father        bone cancer  . Cancer Sister        lymphoma  . Cancer Brother   . Breast cancer Neg Hx    Social History   Socioeconomic History  . Marital status: Married    Spouse name: [name]  . Number of children: Not on file  . Years of education: Not on file  . Highest education level: Not on file  Occupational History  . Not on file  Tobacco Use  . Smoking status: Never Smoker  . Smokeless tobacco: Never Used  Vaping Use  . Vaping Use:  Never used  Substance and Sexual Activity  . Alcohol use: No  . Drug use: No  . Sexual activity: Not on file  Other Topics Concern  . Not on file  Social History Narrative   Lives with husband   Social Determinants of Health   Financial Resource Strain: Not on file  Food Insecurity: Not on file  Transportation Needs: Not on file  Physical Activity: Not on file  Stress: Not on file  Social Connections: Not on file   Allergies  Allergen Reactions  . Shellfish Allergy Shortness Of Breath    respitatory distress   . Sodium Sulfite Shortness Of Breath    Severe Congestion that creates inability to breath  . Sulfa Antibiotics Other (See Comments) and Shortness Of Breath    Other reaction(s): OTHER Other reaction(s): Other (See Comments) respitatory distress  Other reaction(s): OTHER respitatory distress  Other reaction(s): OTHER Other reaction(s): Other (See Comments) respitatory distress  respitatory distress   . Codeine Other (See Comments)    Per patient "it kept her up all night"    Medications   Medications Prior to Admission  Medication Sig Dispense Refill Last Dose  . acetaminophen (TYLENOL) 325 MG tablet Take 2 tablets (650 mg total) by mouth every 6 (six) hours as needed for moderate pain or fever. 30 tablet 0 prn at prn  . allopurinol (ZYLOPRIM) 100 MG tablet Take 100 mg by mouth daily.   01/23/2020 at 1300  . amLODipine (NORVASC) 10 MG tablet Take 1 tablet (10 mg total) by mouth daily. 30 tablet 1 01/23/2020 at 1300  . [EXPIRED] amoxicillin-clavulanate (AUGMENTIN) 875-125 MG tablet Take 1 tablet by mouth every 12 (twelve) hours for 5 days. 10 tablet 0 01/23/2020 at 1300  . busPIRone (BUSPAR) 15 MG tablet Take 30 mg by mouth 2 (two) times daily.   01/23/2020 at 1300  . Calcium Carbonate-Vitamin D 600-400 MG-UNIT tablet Take 1 tablet by mouth daily.    01/23/2020 at 1300  . escitalopram (LEXAPRO) 10 MG tablet Take 10 mg by mouth daily. (take with 20mg  tablet to  equal 30mg  total daily)   01/23/2020 at 1300  . escitalopram (LEXAPRO) 20 MG tablet Take 20 mg by mouth daily. (take with 10mg  tablet to equal 30mg  total daily)   01/23/2020 at 1300  . fluconazole (DIFLUCAN) 150 MG tablet Take 150 mg by mouth 2 (two) times a week.   01/22/2020 at unknown  . folic acid (FOLVITE) 1 MG tablet Take 1 mg by mouth daily.   01/23/2020 at 1300  . gabapentin (NEURONTIN) 600 MG tablet Take 600 mg by mouth 3 (three) times daily.   01/23/2020 at 1300  . lovastatin (MEVACOR) 20 MG tablet Take 20 mg by mouth at bedtime.   unknown at unknown  . Melatonin 5 MG TABS Take  1 tablet (5 mg total) by mouth at bedtime. 30 tablet 0 unknown at unknown  . meloxicam (MOBIC) 15 MG tablet Take 15 mg by mouth daily.   unknown at unknown  . metoprolol tartrate (LOPRESSOR) 25 MG tablet Take 1 tablet (25 mg total) by mouth 2 (two) times daily. (Patient taking differently: Take 25 mg by mouth daily. ) 60 tablet 0 01/23/2020 at 1300  . pantoprazole (PROTONIX) 40 MG tablet Take 40 mg by mouth daily.    01/23/2020 at 1300  . rOPINIRole (REQUIP) 0.5 MG tablet Take 0.5 mg by mouth at bedtime.    unknown at unknown  . scopolamine (TRANSDERM-SCOP) 1 MG/3DAYS Place 1 patch (1.5 mg total) onto the skin every 3 (three) days. 10 patch 1 01/21/2020 at unknown  . sodium chloride 0.9 % nebulizer solution Take 3 mLs by nebulization as needed for wheezing.    prn at prn  . feeding supplement (ENSURE ENLIVE / ENSURE PLUS) LIQD Take 237 mLs by mouth 3 (three) times daily between meals. 237 mL 12   . Nebulizers (COMPRESSOR/NEBULIZER) MISC Nebulizer with supplies, trach collar for med admin. 1 each 0      Vitals   Vitals:   02/05/20 0400 02/05/20 0500 02/05/20 0600 02/05/20 0700  BP: 110/70 127/61 140/71 (!) 124/57  Pulse: 87 88 (!) 102 96  Resp:      Temp: 98.4 F (36.9 C)     TempSrc: Axillary     SpO2: 94% 97% 98% 99%  Weight:  69.6 kg    Height:         Body mass index is 26.32 kg/m.  Physical  Exam   General: Laying comfortably in bed; in no acute distress.  HENT: Normal oropharynx and mucosa. Normal external appearance of ears and nose.  Neck: Supple, no pain or tenderness  CV: No JVD. No peripheral edema.  Pulmonary: Symmetric Chest rise. Normal respiratory effort.  Abdomen: Soft to touch, non-tender.  Ext: No cyanosis, edema, or deformity  Skin: No rash. Normal palpation of skin.   Musculoskeletal: Normal digits and nails by inspection. No clubbing.   Neurologic Examination  Mental status/Cognition: Alert, oriented to self, place, month and year, good attention.  Speech/language: Trach with no speech valve, comprehension intact, can mouth words Cranial nerves:   CN II Pupils equal and reactive to light, no VF deficits    CN III,IV,VI Opthalmoplegia. No nystagmus.   CN V normal sensation in V1, V2, and V3 segments bilaterally    CN VII no asymmetry, no nasolabial fold flattening   CN VIII    CN IX & X normal palatal elevation, no uvular deviation   CN XI 5/5 head turn and 5/5 shoulder shrug bilaterally   CN XII midline tongue protrusion   Motor:  Muscle bulk: poor, tone normal, pronator drift none tremor none Mvmt Root Nerve  Muscle Right Left Comments  SA C5/6 Ax Deltoid 5 5   EF C5/6 Mc Biceps 5 5   EE C6/7/8 Rad Triceps 5 5   WF C6/7 Med FCR 5 5   WE C7/8 PIN ECU 5 5   F Ab C8/T1 U ADM/FDI 5 5   HF L1/2/3 Fem Illopsoas 4 4   KE L2/3/4 Fem Quad     DF L4/5 D Peron Tib Ant 5 5   PF S1/2 Tibial Grc/Sol 5 5    Reflexes:  Right Left Comments  Pectoralis      Biceps (C5/6) 2 2   Brachioradialis (  C5/6) 2 2    Triceps (C6/7) 2 2    Patellar (L3/4) 2 2    Achilles (S1)      Hoffman      Plantar     Jaw jerk    Sensation:  Light touch Intact throughout   Pin prick Intact throughout   Temperature Intact   Vibration Decreased in BL feet upto mid shin  Proprioception Decreased in BL big toes.   Coordination/Complex Motor:  - Finger to Nose intact BL -  Heel to shin intact BL - Rapid alternating movement are normal - Gait: did not assess as she was hooked up to a vent.  Labs   CBC:  Recent Labs  Lab 02/04/20 0629 02/05/20 0629  WBC 9.5 9.0  NEUTROABS 7.3 6.7  HGB 9.3* 10.2*  HCT 30.6* 33.0*  MCV 85.0 84.2  PLT 186 097    Basic Metabolic Panel:  Lab Results  Component Value Date   NA 137 02/05/2020   K 4.4 02/05/2020   CO2 30 02/05/2020   GLUCOSE 181 (H) 02/05/2020   BUN 24 (H) 02/05/2020   CREATININE 0.79 02/05/2020   CALCIUM 10.1 02/05/2020   GFRNONAA >60 02/05/2020   GFRAA 56 (L) 05/18/2019   Lipid Panel: No results found for: LDLCALC HgbA1c:  Lab Results  Component Value Date   HGBA1C 6.3 (H) 01/20/2020   Urine Drug Screen: No results found for: LABOPIA, COCAINSCRNUR, LABBENZ, AMPHETMU, THCU, LABBARB  Alcohol Level No results found for: [facility]  CT Head without contrast: Mild cerebral atrophy with advanced chronic small vessel ischemic disease, stable as compared to the head CT of 01/07/2020.  MRI Brain No acute infarction, hemorrhage, or mass. Advanced chronic microvascular ischemic changes.  Impression   Angela David is a 79 y.o. female with long stnading peripheral neuropathy for atleast the last few years. She is established with outpatient neurology and Nerve conduction studies demonstrated axonal peripheral neuropathy. I ordered labs on her including Vit B6, SPEP and Paraneoplastic panel. The advance white matter disease noted on MRI looks stable compared to her prior MRI. Recommend that she follow up with Dr. Jannifer Franklin outpatient.  Recommendations  - Follow up with Dr. Jannifer Franklin with [facility] - I ordered Vit B6, SPEP and Paraneoplastic panel. Will need to be followed up outpatient. ______________________________________________________________________   Thank you for the opportunity to take part in the care of this patient. If you have any further questions, please contact the  neurology consultation attending.  Signed,  [place] Pager Number [phone] _ _ _   _ __   _ __ _ _  __ __   _ __   __ _

## 2020-02-05 NOTE — Progress Notes (Signed)
Pt tolerating trach collar with plans to go back on vent at night. No c/o pain and pt tolerating tube feeds ar this time. VSS and foley remains in place with adequate urine output. Plans to d/c to home with [facility] needs. Social working involved. Will continue to monitor and assess needs.

## 2020-02-06 LAB — BASIC METABOLIC PANEL
Anion gap: 10 (ref 5–15)
BUN: 27 mg/dL — ABNORMAL HIGH (ref 8–23)
CO2: 32 mmol/L (ref 22–32)
Calcium: 10 mg/dL (ref 8.9–10.3)
Chloride: 94 mmol/L — ABNORMAL LOW (ref 98–111)
Creatinine, Ser: 0.89 mg/dL (ref 0.44–1.00)
GFR, Estimated: >60 mL/min (ref >60)
Glucose, Bld: 152 mg/dL — ABNORMAL HIGH (ref 70–99)
Potassium: 4.5 mmol/L (ref 3.5–5.1)
Sodium: 136 mmol/L (ref 135–145)

## 2020-02-06 LAB — CBC WITH DIFFERENTIAL/PLATELET
Abs Immature Granulocytes: 0.14 10*3/uL — ABNORMAL HIGH (ref 0.00–0.07)
Basophils Absolute: 0 10*3/uL (ref 0.0–0.1)
Basophils Relative: 0 %
Eosinophils Absolute: 0.3 10*3/uL (ref 0.0–0.5)
Eosinophils Relative: 3 %
HCT: 34.7 % — ABNORMAL LOW (ref 36.0–46.0)
Hemoglobin: 10.8 g/dL — ABNORMAL LOW (ref 12.0–15.0)
Immature Granulocytes: 2 %
Lymphocytes Relative: 15 %
Lymphs Abs: 1.4 10*3/uL (ref 0.7–4.0)
MCH: 25.8 pg — ABNORMAL LOW (ref 26.0–34.0)
MCHC: 31.1 g/dL (ref 30.0–36.0)
MCV: 83 fL (ref 80.0–100.0)
Monocytes Absolute: 0.6 10*3/uL (ref 0.1–1.0)
Monocytes Relative: 6 %
Neutro Abs: 6.8 10*3/uL (ref 1.7–7.7)
Neutrophils Relative %: 74 %
Platelets: 262 10*3/uL (ref 150–400)
RBC: 4.18 MIL/uL (ref 3.87–5.11)
RDW: 13.2 % (ref 11.5–15.5)
WBC: 9.2 10*3/uL (ref 4.0–10.5)
nRBC: 0 % (ref 0.0–0.2)

## 2020-02-06 LAB — GLUCOSE, CAPILLARY
Glucose-Capillary: 134 mg/dL — ABNORMAL HIGH (ref 70–99)
Glucose-Capillary: 137 mg/dL — ABNORMAL HIGH (ref 70–99)
Glucose-Capillary: 155 mg/dL — ABNORMAL HIGH (ref 70–99)
Glucose-Capillary: 157 mg/dL — ABNORMAL HIGH (ref 70–99)
Glucose-Capillary: 171 mg/dL — ABNORMAL HIGH (ref 70–99)
Glucose-Capillary: 180 mg/dL — ABNORMAL HIGH (ref 70–99)

## 2020-02-06 LAB — PHOSPHORUS: Phosphorus: 4.8 mg/dL — ABNORMAL HIGH (ref 2.5–4.6)

## 2020-02-06 LAB — MAGNESIUM: Magnesium: 2.1 mg/dL (ref 1.7–2.4)

## 2020-02-06 NOTE — Progress Notes (Signed)
OT Cancellation Note  Patient Details Name: Zoriah Pulice MRN: [id number] DOB: 1940/04/03   Cancelled Treatment:    Reason Eval/Treat Not Completed: Fatigue/lethargy limiting ability to participate. Attempted x2 this date, pt sleeping soundly not waking to voice/light/touch. Will follow up at later date/time as able to initiate services.   Dessie Coma, M.S. OTR/L  02/06/20, 3:43 PM  ascom [phone]

## 2020-02-06 NOTE — Progress Notes (Signed)
Pt requested to wait awhile longer before going back on vent. She is comfortable with no resp distress.

## 2020-02-06 NOTE — Progress Notes (Addendum)
CRITICAL CARE NOTE  CC  follow up respiratory failure  SUBJECTIVE No events overnight.  She tolerated trach collar during the day.  She was placed on the ventilator last night.  She nods yes to feeling well.  Denies any fever nausea vomiting or diarrhea.  No abdominal pain no chest pain. breathing is stable    SIGNIFICANT EVENTS none    BP (!) 143/46   Pulse (!) 104   Temp 98.1 F (36.7 C) (Oral)   Resp 20   Ht 5' 4.02" (1.626 m)   Wt 67.9 kg   SpO2 100%   BMI 25.68 kg/m    REVIEW OF SYSTEMS  PATIENT IS UNABLE TO PROVIDE COMPLETE REVIEW OF SYSTEMS DUE TO SEVERE CRITICAL ILLNESS   PHYSICAL EXAMINATION:  GENERAL:critically ill appearing, no resp distress HEAD: Normocephalic, atraumatic.  EYES: Pupils equal, round, reactive to light.  No scleral icterus.  MOUTH: Moist mucosal membrane. NECK: Supple. No thyromegaly. No nodules. No JVD.  PULMONARY: Clear to auscultation CARDIOVASCULAR: S1 and S2. Regular rate and rhythm. No murmurs, rubs, or gallops.  GASTROINTESTINAL: Soft, nontender, -distended. No masses. Positive bowel sounds. No hepatosplenomegaly.  MUSCULOSKELETAL: No swelling, clubbing, or edema.  NEUROLOGIC: Awake and alert follows commands moves all extremities  SKIN:intact,warm,dry  INTAKE/OUTPUT  Intake/Output Summary (Last 24 hours) at 02/06/2020 1410 Last data filed at 02/06/2020 0600 Gross per 24 hour  Intake 900 ml  Output 2400 ml  Net -1500 ml    LABS  CBC Recent Labs  Lab 02/04/20 0629 02/05/20 0629 02/06/20 0521  WBC 9.5 9.0 9.2  HGB 9.3* 10.2* 10.8*  HCT 30.6* 33.0* 34.7*  PLT 186 217 262   Coag's No results for input(s): APTT, INR in the last 168 hours. BMET Recent Labs  Lab 02/04/20 0629 02/05/20 0629 02/06/20 0521  NA 136 137 136  K 4.6 4.4 4.5  CL 95* 96* 94*  CO2 33* 30 32  BUN 19 24* 27*  CREATININE 0.73 0.79 0.89  GLUCOSE 162* 181* 152*   Electrolytes Recent Labs  Lab 02/04/20 0629 02/05/20 0629 02/06/20 0521   CALCIUM 9.8 10.1 10.0  MG 1.8 1.8 2.1  PHOS 4.0 3.9 4.8*   Sepsis Markers No results for input(s): LATICACIDVEN, PROCALCITON, O2SATVEN in the last 168 hours. ABG Recent Labs  Lab 01/31/20 1634 01/31/20 2102 02/03/20 1055  PHART 7.23* 7.46* 7.44  PCO2ART 85* 44 53*  PO2ART 62* 162* 92   Liver Enzymes No results for input(s): AST, ALT, ALKPHOS, BILITOT, ALBUMIN in the last 168 hours. Cardiac Enzymes No results for input(s): TROPONINI, PROBNP in the last 168 hours. Glucose Recent Labs  Lab 02/05/20 1215 02/05/20 1648 02/06/20 0019 02/06/20 0335 02/06/20 0723 02/06/20 1137  GLUCAP 159* 138* 157* 137* 180* 171*     No results found for this or any previous visit (from the past 240 hour(s)).  MEDICATIONS   Current Facility-Administered Medications:  .  0.9 %  sodium chloride infusion, 250 mL, Intravenous, Continuous, Pabon, Diego F, MD, Stopped at 02/01/20 0110 .  0.9 %  sodium chloride infusion, 250 mL, Intravenous, Continuous, Akingbade, Samuella Cota, MD, Paused at 02/02/20 1402 .  acetaminophen (TYLENOL) suppository 650 mg, 650 mg, Rectal, Q6H PRN, Pabon, Diego F, MD .  acetaminophen (TYLENOL) tablet 650 mg, 650 mg, Per NG tube, Q6H PRN, Flora Lipps, MD, 650 mg at 02/03/20 0846 .  albuterol (PROVENTIL) (2.5 MG/3ML) 0.083% nebulizer solution 2.5 mg, 2.5 mg, Nebulization, BID, Sharen Hones, MD, 2.5 mg at 02/06/20 0845 .  allopurinol (ZYLOPRIM) tablet 100 mg, 100 mg, Per NG tube, Daily, Pabon, Diego F, MD, 100 mg at 02/06/20 1006 .  busPIRone (BUSPAR) tablet 30 mg, 30 mg, Per NG tube, BID, Pabon, Diego F, MD, 30 mg at 02/06/20 1006 .  chlorhexidine gluconate (MEDLINE KIT) (PERIDEX) 0.12 % solution 15 mL, 15 mL, Mouth Rinse, BID, Akingbade, Jimi O, MD, 15 mL at 02/05/20 2052 .  Chlorhexidine Gluconate Cloth 2 % PADS 6 each, 6 each, Topical, Daily, Pabon, [place] F, MD, 6 each at 02/05/20 2200 .  enoxaparin (LOVENOX) injection 40 mg, 40 mg, Subcutaneous, Q24H, Pabon, Diego F, MD,  40 mg at 02/05/20 2229 .  escitalopram (LEXAPRO) tablet 30 mg, 30 mg, Per Tube, Daily, Pabon, Diego F, MD, 30 mg at 02/06/20 1006 .  feeding supplement (GLUCERNA 1.5 CAL) liquid 1,000 mL, 1,000 mL, Per Tube, Continuous, Kasa, Kurian, MD, Last Rate: 50 mL/hr at 02/06/20 0600, Infusion Verify at 02/06/20 0600 .  fentaNYL (SUBLIMAZE) injection 50 mcg, 50 mcg, Intravenous, Q1H PRN, Tyler Pita, MD .  folic acid (FOLVITE) tablet 1 mg, 1 mg, Per Tube, Daily, Pabon, Diego F, MD, 1 mg at 02/06/20 1006 .  free water 100 mL, 100 mL, Per Tube, Q6H, Sharion Settler, NP, 100 mL at 02/06/20 1200 .  gabapentin (NEURONTIN) 250 MG/5ML solution 600 mg, 600 mg, Per Tube, Q8H, Pabon, Diego F, MD, 600 mg at 02/06/20 0539 .  insulin aspart (novoLOG) injection 0-15 Units, 0-15 Units, Subcutaneous, Q4H, Flora Lipps, MD, 3 Units at 02/06/20 1236 .  insulin glargine (LANTUS) injection 10 Units, 10 Units, Subcutaneous, QHS, Sharen Hones, MD, 10 Units at 02/05/20 2229 .  MEDLINE mouth rinse, 15 mL, Mouth Rinse, q12n4p, Kasa, Kurian, MD, 15 mL at 02/06/20 1236 .  melatonin tablet 5 mg, 5 mg, Per Tube, QHS, Pabon, Diego F, MD, 5 mg at 02/05/20 2229 .  multivitamin with minerals tablet 1 tablet, 1 tablet, Per Tube, Daily, Pabon, Diego F, MD, 1 tablet at 02/06/20 1006 .  ondansetron (ZOFRAN) injection 4 mg, 4 mg, Intravenous, Q6H PRN, Pabon, Diego F, MD, 4 mg at 01/30/20 1746 .  pantoprazole sodium (PROTONIX) 40 mg/20 mL oral suspension 40 mg, 40 mg, Per Tube, QHS, Benita Gutter, RPH, 40 mg at 02/05/20 2230 .  polyvinyl alcohol (LIQUIFILM TEARS) 1.4 % ophthalmic solution 1 drop, 1 drop, Both Eyes, PRN, Flora Lipps, MD, 1 drop at 02/03/20 1324 .  pravastatin (PRAVACHOL) tablet 20 mg, 20 mg, Per NG tube, q1800, Pabon, Diego F, MD, 20 mg at 02/05/20 1727 .  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 Noncompliant with home nocturnal vent for months, signs and status post gastrostomy tube.  Acute Hypoxic&Hypercapnic Respiratory Failure  Hypercapnia due to severe muscle weakness On vent at night and trying TC during the day, seems to be  tolerating it well without resp distress  Acute encephalopathy due to hypercapnea resolved  Patient and her husband would like her to come home.  The social worker and the other's support staff is trying to arrange for her to go home with nighttime ventilator and daytime trach collar.  Peripheral neuropathy being seen by neurologist ,. No further MRI this admission as per neurologist. .  Reportedly has an appointment with a neurologist on 12/29 as per her husband.  Diabetes mellitus continue      LA insulin and sliding scale  GI PROPHYLAXIS as indicated  NUTRITIONAL STATUS DIET-->TF's as tolerated Constipation protocol as indicated  ELECTROLYTES -follow labs as needed -replace as needed -pharmacy consultation and following   DVT/GI PRX ordered and assessed TRANSFUSIONS AS NEEDED MONITOR FSBS I Assessed the need for Labs I Assessed the need for Foley I Assessed the need for Central Venous Line Family Discussion when available I Assessed the need for Mobilization I made an Assessment of medications to be adjusted accordingly Safety Risk assessment  completed     Rosine Door, MD  02/06/2020 2:10 PM [facility]

## 2020-02-07 LAB — CBC WITH DIFFERENTIAL/PLATELET
Abs Immature Granulocytes: 0.17 10*3/uL — ABNORMAL HIGH (ref 0.00–0.07)
Basophils Absolute: 0.1 10*3/uL (ref 0.0–0.1)
Basophils Relative: 1 %
Eosinophils Absolute: 0.2 10*3/uL (ref 0.0–0.5)
Eosinophils Relative: 2 %
HCT: 35.5 % — ABNORMAL LOW (ref 36.0–46.0)
Hemoglobin: 11.2 g/dL — ABNORMAL LOW (ref 12.0–15.0)
Immature Granulocytes: 2 %
Lymphocytes Relative: 14 %
Lymphs Abs: 1.4 10*3/uL (ref 0.7–4.0)
MCH: 26.2 pg (ref 26.0–34.0)
MCHC: 31.5 g/dL (ref 30.0–36.0)
MCV: 83.1 fL (ref 80.0–100.0)
Monocytes Absolute: 0.6 10*3/uL (ref 0.1–1.0)
Monocytes Relative: 7 %
Neutro Abs: 7.3 10*3/uL (ref 1.7–7.7)
Neutrophils Relative %: 74 %
Platelets: 267 10*3/uL (ref 150–400)
RBC: 4.27 MIL/uL (ref 3.87–5.11)
RDW: 13.1 % (ref 11.5–15.5)
WBC: 9.7 10*3/uL (ref 4.0–10.5)
nRBC: 0 % (ref 0.0–0.2)

## 2020-02-07 LAB — BASIC METABOLIC PANEL
Anion gap: 11 (ref 5–15)
BUN: 30 mg/dL — ABNORMAL HIGH (ref 8–23)
CO2: 32 mmol/L (ref 22–32)
Calcium: 10 mg/dL (ref 8.9–10.3)
Chloride: 94 mmol/L — ABNORMAL LOW (ref 98–111)
Creatinine, Ser: 0.94 mg/dL (ref 0.44–1.00)
GFR, Estimated: >60 mL/min (ref >60)
Glucose, Bld: 176 mg/dL — ABNORMAL HIGH (ref 70–99)
Potassium: 4.3 mmol/L (ref 3.5–5.1)
Sodium: 137 mmol/L (ref 135–145)

## 2020-02-07 LAB — GLUCOSE, CAPILLARY
Glucose-Capillary: 122 mg/dL — ABNORMAL HIGH (ref 70–99)
Glucose-Capillary: 139 mg/dL — ABNORMAL HIGH (ref 70–99)
Glucose-Capillary: 141 mg/dL — ABNORMAL HIGH (ref 70–99)
Glucose-Capillary: 148 mg/dL — ABNORMAL HIGH (ref 70–99)
Glucose-Capillary: 159 mg/dL — ABNORMAL HIGH (ref 70–99)
Glucose-Capillary: 160 mg/dL — ABNORMAL HIGH (ref 70–99)
Glucose-Capillary: 167 mg/dL — ABNORMAL HIGH (ref 70–99)
Glucose-Capillary: 169 mg/dL — ABNORMAL HIGH (ref 70–99)

## 2020-02-07 LAB — MAGNESIUM: Magnesium: 2.2 mg/dL (ref 1.7–2.4)

## 2020-02-07 LAB — PHOSPHORUS: Phosphorus: 4.4 mg/dL (ref 2.5–4.6)

## 2020-02-07 MED ORDER — ALBUTEROL SULFATE (2.5 MG/3ML) 0.083% IN NEBU: 2.5000 mg | INHALATION_SOLUTION | RESPIRATORY_TRACT | Status: DC | PRN

## 2020-02-07 NOTE — Progress Notes (Signed)
Attempted x 2 to camera pt's husband into the room via [place]. I spoke with pt and due to husbands needs it worked out better that Physiological scientist helped with bedside phone call instead of tel-visit

## 2020-02-07 NOTE — Progress Notes (Signed)
Placed back on vent

## 2020-02-07 NOTE — Plan of Care (Signed)

## 2020-02-07 NOTE — Progress Notes (Signed)
CRITICAL CARE NOTE  CC  follow up respiratory failure S/p Tracheostomy  SUBJECTIVE No new issues.  Denies fever chills or rigors mild cough.  No significant sputum.  No chest pain no hemoptysis.  No nausea vomiting or diarrhea.  No headaches.  Tolerating trach collar during the day very well      SIGNIFICANT EVENTS none    BP 127/67   Pulse 93   Temp 98.5 F (36.9 C) (Oral)   Resp (!) 21   Ht 5' 4.02" (1.626 m)   Wt 68.9 kg   SpO2 100%   BMI 26.06 kg/m    REVIEW OF SYSTEMS  As above PHYSICAL EXAMINATION:  GENERAL:critically ill appearing, no resp distress HEAD: Normocephalic, atraumatic.  EYES: Pupils equal, round, reactive to light.  No scleral icterus.  MOUTH: Moist mucosal membrane. NECK: Supple. No thyromegaly. No nodules. No JVD.  PULMONARY: No rhonchi or wheezing CARDIOVASCULAR: S1 and S2. Regular rate and rhythm. No murmurs, rubs, or gallops.  GASTROINTESTINAL: Soft, nontender, -distended. No masses. Positive bowel sounds. No hepatosplenomegaly.  MUSCULOSKELETAL: No swelling, clubbing, or edema.  NEUROLOGIC: Can alert moves all extremities follows commands tries to communicate through the trach  SKIN:intact,warm,dry  INTAKE/OUTPUT  Intake/Output Summary (Last 24 hours) at 02/07/2020 1416 Last data filed at 02/07/2020 1200 Gross per 24 hour  Intake 1500 ml  Output 800 ml  Net 700 ml    LABS  CBC Recent Labs  Lab 02/05/20 0629 02/06/20 0521 02/07/20 0536  WBC 9.0 9.2 9.7  HGB 10.2* 10.8* 11.2*  HCT 33.0* 34.7* 35.5*  PLT 217 262 267   Coag's No results for input(s): APTT, INR in the last 168 hours. BMET Recent Labs  Lab 02/05/20 0629 02/06/20 0521 02/07/20 0536  NA 137 136 137  K 4.4 4.5 4.3  CL 96* 94* 94*  CO2 30 32 32  BUN 24* 27* 30*  CREATININE 0.79 0.89 0.94  GLUCOSE 181* 152* 176*   Electrolytes Recent Labs  Lab 02/05/20 0629 02/06/20 0521 02/07/20 0536  CALCIUM 10.1 10.0 10.0  MG 1.8 2.1 2.2  PHOS 3.9 4.8* 4.4    Sepsis Markers No results for input(s): LATICACIDVEN, PROCALCITON, O2SATVEN in the last 168 hours. ABG Recent Labs  Lab 01/31/20 1634 01/31/20 2102 02/03/20 1055  PHART 7.23* 7.46* 7.44  PCO2ART 85* 44 53*  PO2ART 62* 162* 92   Liver Enzymes No results for input(s): AST, ALT, ALKPHOS, BILITOT, ALBUMIN in the last 168 hours. Cardiac Enzymes No results for input(s): TROPONINI, PROBNP in the last 168 hours. Glucose Recent Labs  Lab 02/06/20 1614 02/06/20 1949 02/06/20 2359 02/07/20 0329 02/07/20 0739 02/07/20 1200  GLUCAP 155* 134* 160* 139* 167* 169*     No results found for this or any previous visit (from the past 240 hour(s)).  MEDICATIONS   Current Facility-Administered Medications:  .  0.9 %  sodium chloride infusion, 250 mL, Intravenous, Continuous, Pabon, Diego F, MD, Stopped at 02/01/20 0110 .  0.9 %  sodium chloride infusion, 250 mL, Intravenous, Continuous, Akingbade, Samuella Cota, MD, Paused at 02/02/20 1402 .  acetaminophen (TYLENOL) suppository 650 mg, 650 mg, Rectal, Q6H PRN, Pabon, Diego F, MD .  acetaminophen (TYLENOL) tablet 650 mg, 650 mg, Per NG tube, Q6H PRN, Flora Lipps, MD, 650 mg at 02/03/20 0846 .  albuterol (PROVENTIL) (2.5 MG/3ML) 0.083% nebulizer solution 2.5 mg, 2.5 mg, Nebulization, Q4H PRN, Rosine Door, MD .  allopurinol (ZYLOPRIM) tablet 100 mg, 100 mg, Per NG tube, Daily, Pabon, Diego F,  MD, 100 mg at 02/07/20 1139 .  busPIRone (BUSPAR) tablet 30 mg, 30 mg, Per NG tube, BID, Pabon, Diego F, MD, 30 mg at 02/07/20 0906 .  chlorhexidine gluconate (MEDLINE KIT) (PERIDEX) 0.12 % solution 15 mL, 15 mL, Mouth Rinse, BID, Akingbade, Jimi O, MD, 15 mL at 02/07/20 0906 .  Chlorhexidine Gluconate Cloth 2 % PADS 6 each, 6 each, Topical, Daily, Pabon, [place] F, MD, 6 each at 02/06/20 2200 .  enoxaparin (LOVENOX) injection 40 mg, 40 mg, Subcutaneous, Q24H, Pabon, Diego F, MD, 40 mg at 02/06/20 2227 .  escitalopram (LEXAPRO) tablet 30 mg, 30 mg, Per Tube,  Daily, Pabon, Diego F, MD, 30 mg at 02/07/20 0906 .  feeding supplement (GLUCERNA 1.5 CAL) liquid 1,000 mL, 1,000 mL, Per Tube, Continuous, Kasa, Kurian, MD, Last Rate: 50 mL/hr at 02/07/20 1200, Infusion Verify at 02/07/20 1200 .  fentaNYL (SUBLIMAZE) injection 50 mcg, 50 mcg, Intravenous, Q1H PRN, Tyler Pita, MD .  folic acid (FOLVITE) tablet 1 mg, 1 mg, Per Tube, Daily, Pabon, Diego F, MD, 1 mg at 02/07/20 0905 .  free water 100 mL, 100 mL, Per Tube, Q6H, Sharion Settler, NP, 100 mL at 02/07/20 0642 .  gabapentin (NEURONTIN) 250 MG/5ML solution 600 mg, 600 mg, Per Tube, Q8H, Pabon, Diego F, MD, 600 mg at 02/07/20 4742 .  insulin aspart (novoLOG) injection 0-15 Units, 0-15 Units, Subcutaneous, Q4H, Flora Lipps, MD, 3 Units at 02/07/20 0855 .  insulin glargine (LANTUS) injection 10 Units, 10 Units, Subcutaneous, QHS, Sharen Hones, MD, 10 Units at 02/06/20 2227 .  MEDLINE mouth rinse, 15 mL, Mouth Rinse, q12n4p, Kasa, Kurian, MD, 15 mL at 02/06/20 1615 .  melatonin tablet 5 mg, 5 mg, Per Tube, QHS, Pabon, Diego F, MD, 5 mg at 02/06/20 2227 .  multivitamin with minerals tablet 1 tablet, 1 tablet, Per Tube, Daily, Pabon, Diego F, MD, 1 tablet at 02/07/20 0905 .  ondansetron (ZOFRAN) injection 4 mg, 4 mg, Intravenous, Q6H PRN, Pabon, Diego F, MD, 4 mg at 01/30/20 1746 .  pantoprazole sodium (PROTONIX) 40 mg/20 mL oral suspension 40 mg, 40 mg, Per Tube, QHS, Benita Gutter, RPH, 40 mg at 02/06/20 2227 .  polyvinyl alcohol (LIQUIFILM TEARS) 1.4 % ophthalmic solution 1 drop, 1 drop, Both Eyes, PRN, Flora Lipps, MD, 1 drop at 02/03/20 1324 .  pravastatin (PRAVACHOL) tablet 20 mg, 20 mg, Per NG tube, q1800, Pabon, Diego F, MD, 20 mg at 02/06/20 1710 .  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 Noncompliant with home nocturnal vent for months,  status post gastrostomy tube.  Acute Hypoxic&Hypercapnic Respiratory Failure  Hypercapnia due to severe muscle weakness On vent at night andt TC during the day, seems to be tolerating it well without resp distress Plan is for her to go home with the vent use  during the night and trach collar during the day  Acute encephalopathy due to hypercapnea resolved  Patient and her husband would like her to come home. The social worker and the other's support staff is trying to arrange for her to go home with nighttime ventilator and daytime trach collar.  Peripheral neuropathy being seen by neurologist ,. No further MRI this admission as per neurologist. . Reportedly has an appointment with a neurologist on 12/29as per her husband.  Diabetes mellitus continueLAinsulin and sliding scale  GI PROPHYLAXIS as indicated  NUTRITIONAL STATUS DIET-->TF's as tolerated Constipation protocol as indicated  ELECTROLYTES -follow labs as needed -replace as needed -pharmacy consultation and following   DVT/GI PRX ordered and assessed TRANSFUSIONS AS NEEDED MONITOR FSBS I Assessed the need for Labs I Assessed the need for Foley I Assessed the need for Central Venous Line Family Discussion when available I Assessed the need for Mobilization I made an Assessment of medications to be adjusted accordingly Safety Risk assessment  completed   Rosine Door, MD  02/07/2020 2:16 PM [facility]

## 2020-02-07 NOTE — Progress Notes (Signed)
OT Cancellation Note  Patient Details Name: Alline Pio MRN: [id number] DOB: 10-31-1940   Cancelled Treatment:    Reason Eval/Treat Not Completed: Fatigue/lethargy limiting ability to participate. Upon arrival pt sleeping soundly, not arousable to light/voice. Will follow up at later date/time as able to initiate re-assessment.   Dessie Coma, M.S. OTR/L  02/07/20, 1:49 PM  ascom [phone]

## 2020-02-08 LAB — MISC LABCORP TEST (SEND OUT): Labcorp test code: 505240

## 2020-02-08 LAB — CBC WITH DIFFERENTIAL/PLATELET
Abs Immature Granulocytes: 0.25 10*3/uL — ABNORMAL HIGH (ref 0.00–0.07)
Basophils Absolute: 0.1 10*3/uL (ref 0.0–0.1)
Basophils Relative: 1 %
Eosinophils Absolute: 0.3 10*3/uL (ref 0.0–0.5)
Eosinophils Relative: 3 %
HCT: 36.1 % (ref 36.0–46.0)
Hemoglobin: 11.2 g/dL — ABNORMAL LOW (ref 12.0–15.0)
Immature Granulocytes: 3 %
Lymphocytes Relative: 18 %
Lymphs Abs: 1.8 10*3/uL (ref 0.7–4.0)
MCH: 26 pg (ref 26.0–34.0)
MCHC: 31 g/dL (ref 30.0–36.0)
MCV: 83.8 fL (ref 80.0–100.0)
Monocytes Absolute: 0.7 10*3/uL (ref 0.1–1.0)
Monocytes Relative: 7 %
Neutro Abs: 6.9 10*3/uL (ref 1.7–7.7)
Neutrophils Relative %: 68 %
Platelets: 289 10*3/uL (ref 150–400)
RBC: 4.31 MIL/uL (ref 3.87–5.11)
RDW: 13.2 % (ref 11.5–15.5)
WBC: 10 10*3/uL (ref 4.0–10.5)
nRBC: 0 % (ref 0.0–0.2)

## 2020-02-08 LAB — GLUCOSE, CAPILLARY
Glucose-Capillary: 126 mg/dL — ABNORMAL HIGH (ref 70–99)
Glucose-Capillary: 143 mg/dL — ABNORMAL HIGH (ref 70–99)
Glucose-Capillary: 148 mg/dL — ABNORMAL HIGH (ref 70–99)
Glucose-Capillary: 155 mg/dL — ABNORMAL HIGH (ref 70–99)
Glucose-Capillary: 160 mg/dL — ABNORMAL HIGH (ref 70–99)
Glucose-Capillary: 162 mg/dL — ABNORMAL HIGH (ref 70–99)
Glucose-Capillary: 165 mg/dL — ABNORMAL HIGH (ref 70–99)

## 2020-02-08 LAB — PROTEIN ELECTROPHORESIS, SERUM
A/G Ratio: 0.7 (ref 0.7–1.7)
Albumin ELP: 2.8 g/dL — ABNORMAL LOW (ref 2.9–4.4)
Alpha-1-Globulin: 0.5 g/dL — ABNORMAL HIGH (ref 0.0–0.4)
Alpha-2-Globulin: 1.1 g/dL — ABNORMAL HIGH (ref 0.4–1.0)
Beta Globulin: 1.3 g/dL (ref 0.7–1.3)
Gamma Globulin: 1.1 g/dL (ref 0.4–1.8)
Globulin, Total: 3.9 g/dL (ref 2.2–3.9)
Total Protein ELP: 6.7 g/dL (ref 6.0–8.5)

## 2020-02-08 LAB — BASIC METABOLIC PANEL
Anion gap: 12 (ref 5–15)
BUN: 30 mg/dL — ABNORMAL HIGH (ref 8–23)
CO2: 32 mmol/L (ref 22–32)
Calcium: 10 mg/dL (ref 8.9–10.3)
Chloride: 96 mmol/L — ABNORMAL LOW (ref 98–111)
Creatinine, Ser: 0.91 mg/dL (ref 0.44–1.00)
GFR, Estimated: >60 mL/min (ref >60)
Glucose, Bld: 154 mg/dL — ABNORMAL HIGH (ref 70–99)
Potassium: 4.7 mmol/L (ref 3.5–5.1)
Sodium: 140 mmol/L (ref 135–145)

## 2020-02-08 LAB — PHOSPHORUS: Phosphorus: 3.9 mg/dL (ref 2.5–4.6)

## 2020-02-08 LAB — MAGNESIUM: Magnesium: 2.2 mg/dL (ref 1.7–2.4)

## 2020-02-08 NOTE — Evaluation (Signed)
Occupational Therapy Evaluation Patient Details Name: Angela David MRN: [id number] DOB: 08-Mar-1940 Today's Date: 02/08/2020    History of Present Illness Pt is a 79 y/o F her 11/19 with c/o hallucinations x 1 week. Imaging negative. Pt admitted for tx of acute encephalopathy of unclear etiology but suspect due to RLL PNA. (Pt also recently seen in ED on 01/11/20 s/p fall & found to have R clavicular fx & sent home with R shoulder immobilizer, instructed to f/u with ortho on OP basis.) PMH: R breast CA (2002), DM, peripheral neuropathy, glaucoma, high cholesterol, vocal cord paralysis. Rapid response called and pt transferred to CCU, on vent.   Clinical Impression   Ms Trettin was seen for OT re-evaluation this date. Upon arrival husband at bedside and pt alert, mouths words 2/2 chronic trach and is at baseline cognition. MIN A sup>sit, assist for trunk control. MIN A don/doff gown seated EOB - assist for sitting balance 2/2 posterior lean, intermittently SBA. MOD A for LB access. MOD A sit>sup, assist for BLE. OOB mobility deferred 2/2 pt fatigue. Pt would benefit from skilled OT to address noted impairments and functional limitations (see below for any additional details) in order to maximize safety and independence while minimizing falls risk and caregiver burden. Upon hospital discharge, recommend STR to maximize pt safety and return to PLOF.     Follow Up Recommendations  SNF    Equipment Recommendations  Other (comment) (TBD)    Recommendations for Other Services       Precautions / Restrictions Precautions Precautions: Fall Precaution Comments: vent Restrictions Weight Bearing Restrictions: No Other Position/Activity Restrictions: Pt with findings of R clavicle fx on 01/11/20, sent home with shoulder immobilizer & told to f/u with ortho as outpatient but unable to do this since readmission to hospital. Therapy electing to maintain NWB, no ROM R shoulder      Mobility Bed  Mobility Overal bed mobility: Needs Assistance Bed Mobility: Supine to Sit;Sit to Supine     Supine to sit: Min assist;HOB elevated Sit to supine: Mod assist        Transfers                 General transfer comment: deferred due to fatigue    Balance Overall balance assessment: Needs assistance Sitting-balance support: No upper extremity supported;Feet supported Sitting balance-Leahy Scale: Fair   Postural control: Posterior lean                                 ADL either performed or assessed with clinical judgement   ADL Overall ADL's : Needs assistance/impaired                                       General ADL Comments: MIN A don/doff gown seated EOB - assist for sitting balance, intermittently SBA. MOD A for LB access                  Pertinent Vitals/Pain Pain Assessment: No/denies pain     Hand Dominance Right   Extremity/Trunk Assessment Upper Extremity Assessment Upper Extremity Assessment: Generalized weakness   Lower Extremity Assessment Lower Extremity Assessment: Generalized weakness       Communication Communication Communication: Tracheostomy (no valve, mouths words)   Cognition Arousal/Alertness: Awake/alert Behavior During Therapy: WFL for tasks assessed/performed Overall Cognitive Status: Within  Functional Limits for tasks assessed                                 General Comments: spouse present throughout session. reports cognition is at baseline. very supportive spouse.   General Comments       Exercises Exercises: Other exercises Other Exercises Other Exercises: Pt educated re: OT role, DME recs, d/c recs, falls prevention, ECS Other Exercises: UBD, sup<>sit, sitting/standing balance/tolerance   Shoulder Instructions      Home Living Family/patient expects to be discharged to:: Private residence Living Arrangements: Spouse/significant other Available Help at Discharge:  Family;Available 24 hours/day Type of Home: House Home Access: Ramped entrance     Home Layout: Other (Comment) (split level)     Bathroom Shower/Tub: Occupational psychologist: Standard     Home Equipment: Environmental consultant - 2 wheels;Wheelchair - manual;Cane - single point;Grab bars - toilet;Other (comment);Hospital bed          Prior Functioning/Environment Level of Independence: Independent with assistive device(s)        Comments: Pt ambulatory with SPC, uses depends, husband assists with some ADL's. PLOF obtained from chart.        OT Problem List: Decreased strength;Decreased range of motion;Decreased activity tolerance;Impaired balance (sitting and/or standing);Decreased safety awareness;Decreased cognition      OT Treatment/Interventions: Self-care/ADL training;Therapeutic exercise;Energy conservation;DME and/or AE instruction;Therapeutic activities;Patient/family education;Balance training    OT Goals(Current goals can be found in the care plan section) Acute Rehab OT Goals Patient Stated Goal: pt wants to walk OT Goal Formulation: With patient Time For Goal Achievement: 02/22/20 Potential to Achieve Goals: Fair ADL Goals Pt Will Perform Grooming: with modified independence;sitting Pt Will Perform Upper Body Dressing: with modified independence;sitting Pt Will Perform Lower Body Dressing: with mod assist;sitting/lateral leans Pt Will Transfer to Toilet: with mod assist;stand pivot transfer;bedside commode (c LRAD PRN)  OT Frequency: Min 1X/week    AM-PAC OT "6 Clicks" Daily Activity     Outcome Measure Help from another person eating meals?: A Little Help from another person taking care of personal grooming?: A Little Help from another person toileting, which includes using toliet, bedpan, or urinal?: A Lot Help from another person bathing (including washing, rinsing, drying)?: A Lot Help from another person to put on and taking off regular upper body  clothing?: A Little Help from another person to put on and taking off regular lower body clothing?: A Lot 6 Click Score: 15   End of Session Equipment Utilized During Treatment: Oxygen  Activity Tolerance: Patient tolerated treatment well;Patient limited by fatigue Patient left: in bed;with call bell/phone within reach;with family/visitor present  OT Visit Diagnosis: Other abnormalities of gait and mobility (R26.89);Muscle weakness (generalized) (M62.81)                Time: 2446-2863 OT Time Calculation (min): 15 min Charges:  OT General Charges $OT Visit: 1 Visit OT Evaluation $OT Re-eval: 1 Re-eval OT Treatments $Self Care/Home Management : 8-22 mins  Dessie Coma, M.S. OTR/L  02/08/20, 4:20 PM  ascom [phone]

## 2020-02-09 LAB — VITAMIN B6: Vitamin B6: 14.9 ug/L (ref 2.0–32.8)

## 2020-02-09 LAB — CBC WITH DIFFERENTIAL/PLATELET
Abs Immature Granulocytes: 0.22 10*3/uL — ABNORMAL HIGH (ref 0.00–0.07)
Basophils Absolute: 0.1 10*3/uL (ref 0.0–0.1)
Basophils Relative: 1 %
Eosinophils Absolute: 0.3 10*3/uL (ref 0.0–0.5)
Eosinophils Relative: 3 %
HCT: 34.4 % — ABNORMAL LOW (ref 36.0–46.0)
Hemoglobin: 10.9 g/dL — ABNORMAL LOW (ref 12.0–15.0)
Immature Granulocytes: 2 %
Lymphocytes Relative: 14 %
Lymphs Abs: 1.3 10*3/uL (ref 0.7–4.0)
MCH: 26 pg (ref 26.0–34.0)
MCHC: 31.7 g/dL (ref 30.0–36.0)
MCV: 82.1 fL (ref 80.0–100.0)
Monocytes Absolute: 0.6 10*3/uL (ref 0.1–1.0)
Monocytes Relative: 6 %
Neutro Abs: 7 10*3/uL (ref 1.7–7.7)
Neutrophils Relative %: 74 %
Platelets: 302 10*3/uL (ref 150–400)
RBC: 4.19 MIL/uL (ref 3.87–5.11)
RDW: 13.1 % (ref 11.5–15.5)
WBC: 9.5 10*3/uL (ref 4.0–10.5)
nRBC: 0 % (ref 0.0–0.2)

## 2020-02-09 LAB — GLUCOSE, CAPILLARY
Glucose-Capillary: 136 mg/dL — ABNORMAL HIGH (ref 70–99)
Glucose-Capillary: 148 mg/dL — ABNORMAL HIGH (ref 70–99)
Glucose-Capillary: 151 mg/dL — ABNORMAL HIGH (ref 70–99)
Glucose-Capillary: 156 mg/dL — ABNORMAL HIGH (ref 70–99)
Glucose-Capillary: 159 mg/dL — ABNORMAL HIGH (ref 70–99)
Glucose-Capillary: 167 mg/dL — ABNORMAL HIGH (ref 70–99)

## 2020-02-09 LAB — PHOSPHORUS: Phosphorus: 4 mg/dL (ref 2.5–4.6)

## 2020-02-09 LAB — MAGNESIUM: Magnesium: 2.2 mg/dL (ref 1.7–2.4)

## 2020-02-09 MED ORDER — MECLIZINE HCL 12.5 MG PO TABS
12.5000 mg | ORAL_TABLET | Freq: Two times a day (BID) | ORAL | Status: DC | PRN
  Administered 2020-02-09: 12.5 mg
  Filled 2020-02-09 (×3): qty 1

## 2020-02-09 NOTE — Progress Notes (Signed)
Per respiratory therapy, patient has been tolerating home vent without supplemental oxygen. Patient's husband at bedside and demonstrated correct procedure for placing patient on home vent using preprogrammed settings, with appropriate teach back to this RN. Patient O2 saturations remain >95% after switching to home ventilator, patient states she feels comfortable and WOB remains even and unlabored.

## 2020-02-09 NOTE — Progress Notes (Signed)
Neuro: alert and oriented throughout the day, required minimal reminders regarding time and location.  Resp: stable on trach collar throughout the day CV: afebrile, vital signs stable, warm and well perfused GIGU: foley removed at 1630-void by 2230,  Skin: redness/rash/incontinence associated dermitis to the perineal area, barrier cream applied following each cleansing Social: Husband came to visit, discussed home care with him regarding the home vent, skin care, incontinence care, and the potential for tube feeding- administration and maintenance, he voiced understanding though appeared anxious about the idea of the increase in care the patient will require at home. This RN has concerns regarding the level of teaching the husband has received as he could not teach back the home ventilator settings or the oxygen requirements.  This RN feels he will require more extensive teaching of the ventilator in addition to teaching of tube feeding, feeding pump, and a schedule.  At this point, this RN has reservations regarding the patient going home without more teaching with husband, more presence at the bedside, and without having the support of home health care/visiting RN.

## 2020-02-10 LAB — BASIC METABOLIC PANEL
Anion gap: 11 (ref 5–15)
BUN: 31 mg/dL — ABNORMAL HIGH (ref 8–23)
CO2: 29 mmol/L (ref 22–32)
Calcium: 9.4 mg/dL (ref 8.9–10.3)
Chloride: 98 mmol/L (ref 98–111)
Creatinine, Ser: 0.97 mg/dL (ref 0.44–1.00)
GFR, Estimated: >60 mL/min (ref >60)
Glucose, Bld: 147 mg/dL — ABNORMAL HIGH (ref 70–99)
Potassium: 3.7 mmol/L (ref 3.5–5.1)
Sodium: 138 mmol/L (ref 135–145)

## 2020-02-10 LAB — CBC WITH DIFFERENTIAL/PLATELET
Abs Immature Granulocytes: 0.25 10*3/uL — ABNORMAL HIGH (ref 0.00–0.07)
Basophils Absolute: 0.1 10*3/uL (ref 0.0–0.1)
Basophils Relative: 0 %
Eosinophils Absolute: 0.2 10*3/uL (ref 0.0–0.5)
Eosinophils Relative: 2 %
HCT: 33.9 % — ABNORMAL LOW (ref 36.0–46.0)
Hemoglobin: 10.7 g/dL — ABNORMAL LOW (ref 12.0–15.0)
Immature Granulocytes: 2 %
Lymphocytes Relative: 15 %
Lymphs Abs: 1.8 10*3/uL (ref 0.7–4.0)
MCH: 25.5 pg — ABNORMAL LOW (ref 26.0–34.0)
MCHC: 31.6 g/dL (ref 30.0–36.0)
MCV: 80.7 fL (ref 80.0–100.0)
Monocytes Absolute: 0.7 10*3/uL (ref 0.1–1.0)
Monocytes Relative: 6 %
Neutro Abs: 8.8 10*3/uL — ABNORMAL HIGH (ref 1.7–7.7)
Neutrophils Relative %: 75 %
Platelets: 316 10*3/uL (ref 150–400)
RBC: 4.2 MIL/uL (ref 3.87–5.11)
RDW: 13.2 % (ref 11.5–15.5)
WBC: 11.8 10*3/uL — ABNORMAL HIGH (ref 4.0–10.5)
nRBC: 0 % (ref 0.0–0.2)

## 2020-02-10 LAB — MAGNESIUM: Magnesium: 2.2 mg/dL (ref 1.7–2.4)

## 2020-02-10 LAB — GLUCOSE, CAPILLARY
Glucose-Capillary: 132 mg/dL — ABNORMAL HIGH (ref 70–99)
Glucose-Capillary: 134 mg/dL — ABNORMAL HIGH (ref 70–99)
Glucose-Capillary: 137 mg/dL — ABNORMAL HIGH (ref 70–99)
Glucose-Capillary: 143 mg/dL — ABNORMAL HIGH (ref 70–99)
Glucose-Capillary: 144 mg/dL — ABNORMAL HIGH (ref 70–99)
Glucose-Capillary: 150 mg/dL — ABNORMAL HIGH (ref 70–99)

## 2020-02-10 LAB — PHOSPHORUS: Phosphorus: 2.6 mg/dL (ref 2.5–4.6)

## 2020-02-10 NOTE — Progress Notes (Signed)
Neuro: alert and oriented, will quickly self correct any periods of confusion Resp: stable on trilogy home vent and trach collar throughout the day, required catheter suctioning x 3, tolerated well CV: afebrile, vital signs stable GIGU: PEG tube in place, tolerates feed and medication administration, multiple small bowel movements/voiding Skin: significant redness/skin irritation to the perineal area, barrier cream applied Social: husband is the patient's support person, he was at the bedside this afternoon and is due back at approximately 2200 to begin practicing hands on care for the patient such as home vent management, PEG tube medication and tube feeding administration, and skin care with incontinence care   Spoke with Aletta Edouard ([facility] RT), Thedore Mins ([facility] PT), and Darlyn Chamber NP today regarding this RN's significant reservations toward sending the pt home today.  The patient's husband has been unable to teach back the use of the home vent, administering tube feeds, administering medications via PEG tube, maintenance of PEG tube, comfort of changing/peri care of patient, and including but not limited to the patient's diabetic medications or performing glucose monitoring.  He does not directly verbalize discomfort but expresses anxieties regarding performing the extensive care at home the patient requires. This RN has expressed concerns this shift and the last regarding the home care situation for this patient.  Though they will be supplied the necessary materials and equipment, this RN feels it is unsafe and the husband requires further teaching and possibly a home nurse until he feels comfortable and/or the patient gains enough strength to be more involved in her care.   This RN was present for the evaluation done by Nicki Reaper PT today.  The patient required support while moving on the bed, experienced dizziness with position changes and was unable to stand unsupported and unattended.  This RN wholeheartedly  agrees with the recommendation for at least 1-2 weeks of rehab within a SNF facility so the patient can regain strength and work on ADLs with the support of trained professionals.  Both the husband and the patient verbalized to this RN separately they felt much more comfortable with the plan to go to rehab before going home.  The patient expresses anxiety over whether or not she will be able to complete rehab as well as going home without the rehab.  She expressed concern for her husband's ability to care for her as well.   The patient and husband both agreed they would refuse going to [facility] due to a negative experience in the past.  The patient expressed she would be interested in returning to [facility] in [place], if possible.

## 2020-02-10 NOTE — Progress Notes (Signed)
Daily Progress Note   Patient Name: Angela David       Date: 02/10/2020 DOB: 03-28-1940  Age: 79 y.o. MRN#: [id number] Attending Physician: Flora Lipps, MD Primary Care Physician: Iva Boop, MD Admit Date: 01/23/2020  Reason for Consultation/Follow-up: Establishing goals of care  Subjective: Patient resting in bed on TC. She states she is feeling better, and happy at the prospect of going home soon. She feels her husband will be able to provide care as needed.   Recommend outpatient palliative. Recommend transitioning to bolus tube feeds.   Length of Stay: 18  Current Medications: Scheduled Meds:  . allopurinol  100 mg Per NG tube Daily  . busPIRone  30 mg Per NG tube BID  . chlorhexidine gluconate (MEDLINE KIT)  15 mL Mouth Rinse BID  . Chlorhexidine Gluconate Cloth  6 each Topical Daily  . enoxaparin (LOVENOX) injection  40 mg Subcutaneous Q24H  . escitalopram  30 mg Per Tube Daily  . folic acid  1 mg Per Tube Daily  . free water  100 mL Per Tube Q6H  . gabapentin  600 mg Per Tube Q8H  . insulin aspart  0-15 Units Subcutaneous Q4H  . insulin glargine  10 Units Subcutaneous QHS  . mouth rinse  15 mL Mouth Rinse q12n4p  . melatonin  5 mg Per Tube QHS  . multivitamin with minerals  1 tablet Per Tube Daily  . pantoprazole sodium  40 mg Per Tube QHS  . pravastatin  20 mg Per NG tube q1800  . rOPINIRole  0.5 mg Per Tube QHS    Continuous Infusions: . sodium chloride Stopped (02/01/20 0110)  . sodium chloride Stopped (02/02/20 1402)  . feeding supplement (GLUCERNA 1.5 CAL) 50 mL/hr at 02/10/20 1100    PRN Meds: acetaminophen, acetaminophen, albuterol, fentaNYL (SUBLIMAZE) injection, meclizine, ondansetron (ZOFRAN) IV, polyvinyl alcohol  Physical  Exam Pulmonary:     Effort: Pulmonary effort is normal.  Neurological:     Mental Status: She is alert.             Vital Signs: BP 114/81   Pulse 87   Temp 98.6 F (37 C) (Oral)   Resp 16   Ht 5' 4.02" (1.626 m)   Wt 69 kg   SpO2 99%   BMI 26.10 kg/m  SpO2: SpO2: 99 % O2 Device: O2 Device: Ventilator O2 Flow Rate: O2 Flow Rate (L/min): 5 L/min  Intake/output summary:   Intake/Output Summary (Last 24 hours) at 02/10/2020 1121 Last data filed at 02/10/2020 1100 Gross per 24 hour  Intake 1350 ml  Output 365 ml  Net 985 ml   LBM: Last BM Date: 02/09/20 Baseline Weight: Weight: 74.6 kg Most recent weight: Weight: 69 kg       Palliative Assessment/Data:      Patient Active Problem List   Diagnosis Date Noted  . Chronic diastolic CHF (congestive heart failure) ([place]) 01/31/2020  . Uncontrolled type 2 diabetes mellitus with hyperglycemia ([place]) 01/31/2020  . AF (paroxysmal atrial fibrillation) ([place]) 01/31/2020  . Aspiration pneumonia of both lower lobes due to gastric secretions ([place]) 01/28/2020  . Acute metabolic encephalopathy 73/71/0626  . Acute respiratory failure ([place]) 01/23/2020  . Left lower lobe pneumonia 01/16/2020  . 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 Drug-induced polyneuropathy ([place]) 05/05/2019  . 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[place]) 05/05/2019  . Weakness   . Hypoxia   . Acute respiratory failure with hypoxia and hypercapnia ([place]) 03/21/2019  . Hypotension 03/21/2019  . Acute encephalopathy 03/21/2019  . Multiple fractures of ribs, right side, initial encounter for closed fracture 12/25/2018  . Colon polyp 11/06/2018  . Esophageal dysphagia   . 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 Stricture and stenosis of esophagus   . Elevated CEA 09/02/2018  . Goals of care, counseling/discussion 09/02/2018  . Polyp of transverse colon 09/02/2018  . Foreign body in right foot 05/26/2018  . History of tracheostomy 04/30/2018  . Moderate  episode of recurrent major depressive disorder ([place]) 04/03/2018  . Poor balance 04/03/2018  . Shoulder lesion, right 02/28/2017  . Cervical radiculopathy 09/28/2016  . Peripheral neuropathy 08/28/2016  . Diabetic peripheral neuropathy ([place]) 08/28/2016  . Leg weakness, bilateral 08/13/2016  . Gait abnormality 08/13/2016  . Carcinoma of overlapping sites of right breast in female, estrogen receptor negative ([place]) 12/27/2015  . Malignant neoplasm of breast ([place]) 03/28/2015  . 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 Hypercholesterolemia 03/28/2015  . Recurrent major depressive disorder, in partial remission ([place]) 03/01/2015  . 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 Cephalalgia 09/15/2014  . Headache due to trauma 09/15/2014  . Osteopenia 01/07/2014  . Age-related osteoporosis without current pathological fracture 01/07/2014  . History of colonic polyps 12/14/2013  . History of breast cancer 11/26/2012  . H/O malignant neoplasm of breast 11/26/2012  . 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 Tracheostomy present ([place]) 12/06/2011  . Calcification of bronchus or trachea 01/16/2011  . 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 Anxiety state 12/19/2010  . Blood in sputum 12/19/2010  . HLD (hyperlipidemia) 12/19/2010  . Type 2 diabetes mellitus ([place]) 12/19/2010    Palliative Care Assessment & Plan    Recommendations/Plan:  Recommend outpatient palliative to follow.   Recommend transitioning to bolus tube feeds unless there is a contraindication. This will cut down on lines and tubes, help QOL, and improve ease of care.      Code Status:    Code Status Orders  (From admission, onward)         Start     Ordered   01/26/20 1058  Limited resuscitation (code)  Continuous       Question Answer Comment  In the event of cardiac or respiratory ARREST: Initiate Code Blue, Call Rapid Response Yes   In the event of cardiac or respiratory ARREST:  Perform CPR No    In the event of cardiac or respiratory ARREST: Perform Intubation/Mechanical Ventilation Yes   In the event of cardiac or respiratory ARREST: Use NIPPV/BiPAp only if indicated Yes   In the event of cardiac or respiratory ARREST: Administer ACLS medications if indicated Yes   In the event of cardiac or respiratory ARREST: Perform Defibrillation or Cardioversion if indicated Yes   Comments MOST form on chart. If she is found not breathing or with no heartbeat, she wishes to be left that way with no intervention.      01/26/20 1058        Code Status History    Date Active Date Inactive Code Status Order ID Comments User Context   01/23/2020 2152 01/26/2020 1058 Full Code [id number]  Rust-Chester, Huel Cote, NP ED   01/15/2020 2239 01/21/2020 2131 Full Code [id number]  CoxBriant Cedar, DO ED   03/21/2019 0108 04/01/2019 2227 Full Code [id number]  Awilda Bill, NP ED   12/26/2018 0336 12/31/2018 2219 Full Code [id number]  Tylene Fantasia, PA-C ED   11/06/2018 1233 11/09/2018 1917 Full Code [id number]  Jules Husbands, MD Inpatient   Advance Care Planning Activity       Prognosis:   Unable to determine   Care plan was discussed with RN  Thank you for allowing the Palliative Medicine Team to assist in the care of this patient.   Total Time 15 min Prolonged Time Billed  no      Greater than 50%  of this time was spent counseling and coordinating care related to the above assessment and plan.  Asencion Gowda, NP  Please contact Palliative Medicine Team phone at [phone] for questions and concerns.

## 2020-02-10 NOTE — Progress Notes (Signed)
Pt remains on Home vent and is tolerating well at this time. HR 86, SPO2 94%, RR 22.

## 2020-02-10 NOTE — NC FL2 (Signed)
[place] LEVEL OF CARE SCREENING TOOL     IDENTIFICATION  Patient Name: Angela David Birthdate: Jul 22, 1940 Sex: female Admission Date (Current Location): 01/23/2020  [place] and [place] Number:  Engineering geologist and Address:  [facility], [address] [place], [place] [address]      Provider Number: [id number]  Attending Physician Name and Address:  Flora Lipps, MD  Relative Name and Phone Number:  Ithzel, Fedorchak (Spouse)   [phone]    Current Level of Care: Hospital Recommended Level of Care: Other (Comment) [facility]) Prior Approval Number:    Date Approved/Denied:   PASRR Number: [id number]  Discharge Plan:  [facility])    Current Diagnoses: Patient Active Problem List   Diagnosis Date Noted  . Chronic diastolic CHF (congestive heart failure) ([place]) 01/31/2020  . Uncontrolled type 2 diabetes mellitus with hyperglycemia ([place]) 01/31/2020  . AF (paroxysmal atrial fibrillation) ([place]) 01/31/2020  . Aspiration pneumonia of both lower lobes due to gastric secretions ([place]) 01/28/2020  . Acute metabolic encephalopathy 96/78/9381  . Acute respiratory failure ([place]) 01/23/2020  . Left lower lobe pneumonia 01/16/2020  . 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 Drug-induced polyneuropathy ([place]) 05/05/2019  . 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[place]) 05/05/2019  . Weakness   . Hypoxia   . Acute respiratory failure with hypoxia and hypercapnia ([place]) 03/21/2019  . Hypotension 03/21/2019  . Acute encephalopathy 03/21/2019  . Multiple fractures of ribs, right side, initial encounter for closed fracture 12/25/2018  . Colon polyp 11/06/2018  . Esophageal dysphagia   . 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 Stricture and stenosis of esophagus   . Elevated CEA 09/02/2018  . Goals of care, counseling/discussion 09/02/2018  . Polyp of transverse colon 09/02/2018  . Foreign body in right foot 05/26/2018  . History of  tracheostomy 04/30/2018  . Moderate episode of recurrent major depressive disorder ([place]) 04/03/2018  . Poor balance 04/03/2018  . Shoulder lesion, right 02/28/2017  . Cervical radiculopathy 09/28/2016  . Peripheral neuropathy 08/28/2016  . Diabetic peripheral neuropathy ([place]) 08/28/2016  . Leg weakness, bilateral 08/13/2016  . Gait abnormality 08/13/2016  . Carcinoma of overlapping sites of right breast in female, estrogen receptor negative ([place]) 12/27/2015  . Malignant neoplasm of breast ([place]) 03/28/2015  . 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 Hypercholesterolemia 03/28/2015  . Recurrent major depressive disorder, in partial remission ([place]) 03/01/2015  . 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 Cephalalgia 09/15/2014  . Headache due to trauma 09/15/2014  . Osteopenia 01/07/2014  . Age-related osteoporosis without current pathological fracture 01/07/2014  . History of colonic polyps 12/14/2013  . History of breast cancer 11/26/2012  . H/O malignant neoplasm of breast 11/26/2012  . 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 Tracheostomy present ([place]) 12/06/2011  . Calcification of bronchus or trachea 01/16/2011  . 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 Anxiety state 12/19/2010  . Blood in sputum 12/19/2010  . HLD (hyperlipidemia) 12/19/2010  . Type 2 diabetes mellitus ([place]) 12/19/2010    Orientation RESPIRATION BLADDER Height & Weight     Self,Time,Situation,Place  Tracheostomy,Vent Continent Weight: 152 lb 1.9 oz (69 kg) Height:  5' 4.02" (162.6 cm)  BEHAVIORAL SYMPTOMS/MOOD NEUROLOGICAL BOWEL NUTRITION STATUS     (None) Continent Feeding tube  AMBULATORY STATUS COMMUNICATION OF NEEDS Skin   Extensive Assist Verbally (trach) Normal                       Personal Care Assistance Level  of Assistance  Bathing,Feeding,Dressing,Total care Bathing Assistance: Maximum assistance Feeding assistance: Maximum assistance (tube feeds) Dressing  Assistance: Maximum assistance Total Care Assistance: Maximum assistance   Functional Limitations Info  Sight,Hearing,Speech Sight Info: Adequate Hearing Info: Adequate Speech Info: Impaired (trach)    SPECIAL CARE FACTORS FREQUENCY  PT (By licensed PT),OT (By licensed OT)     PT Frequency: Minimum 2X per week OT Frequency: Minimum 1X per week            Contractures Contractures Info: Not present    Additional Factors Info  Code Status (Partial) Code Status Info: Partial: No CPR Allergies Info: Shellfish Allergy, Sodium Sulfite, Sulfa Antibiotics, Codeine           Current Medications (02/10/2020):  This is the current hospital active medication list Current Facility-Administered Medications  Medication Dose Route Frequency Provider Last Rate Last Admin  . 0.9 %  sodium chloride infusion  250 mL Intravenous Continuous Pabon, [place] F, MD   Stopped at 02/01/20 0110  . 0.9 %  sodium chloride infusion  250 mL Intravenous Continuous Cheryll Dessert, MD   Paused at 02/02/20 1402  . acetaminophen (TYLENOL) suppository 650 mg  650 mg Rectal Q6H PRN Pabon, Diego F, MD      . acetaminophen (TYLENOL) tablet 650 mg  650 mg Per NG tube Q6H PRN Flora Lipps, MD   650 mg at 02/03/20 0846  . albuterol (PROVENTIL) (2.5 MG/3ML) 0.083% nebulizer solution 2.5 mg  2.5 mg Nebulization Q4H PRN Rosine Door, MD      . allopurinol (ZYLOPRIM) tablet 100 mg  100 mg Per NG tube Daily Pabon, Sheridan F, MD   100 mg at 02/10/20 1059  . busPIRone (BUSPAR) tablet 30 mg  30 mg Per NG tube BID Pabon, Diego F, MD   30 mg at 02/10/20 1059  . chlorhexidine gluconate (MEDLINE KIT) (PERIDEX) 0.12 % solution 15 mL  15 mL Mouth Rinse BID Cheryll Dessert, MD   15 mL at 02/10/20 1958  . Chlorhexidine Gluconate Cloth 2 % PADS 6 each  6 each Topical Daily Jules Husbands, MD   6 each at 02/08/20 2152  . enoxaparin (LOVENOX) injection 40 mg  40 mg Subcutaneous Q24H Pabon, [place] F, MD   40 mg at 02/09/20 2221  .  escitalopram (LEXAPRO) tablet 30 mg  30 mg Per Tube Daily Pabon, Diego F, MD   30 mg at 02/10/20 1059  . feeding supplement (GLUCERNA 1.5 CAL) liquid 1,000 mL  1,000 mL Per Tube Continuous Flora Lipps, MD 50 mL/hr at 02/10/20 1800 Infusion Verify at 02/10/20 1800  . fentaNYL (SUBLIMAZE) injection 50 mcg  50 mcg Intravenous Q1H PRN Tyler Pita, MD      . folic acid (FOLVITE) tablet 1 mg  1 mg Per Tube Daily Pabon, [place]ie F, MD   1 mg at 02/10/20 1059  . free water 100 mL  100 mL Per Tube Q6H Sharion Settler, NP   100 mL at 02/10/20 1800  . gabapentin (NEURONTIN) 250 MG/5ML solution 600 mg  600 mg Per Tube Q8H Pabon, Diego F, MD   600 mg at 02/10/20 1739  . insulin aspart (novoLOG) injection 0-15 Units  0-15 Units Subcutaneous Q4H Flora Lipps, MD   2 Units at 02/10/20 1958  . insulin glargine (LANTUS) injection 10 Units  10 Units Subcutaneous QHS Sharen Hones, MD   10 Units at 02/09/20 2221  . meclizine (ANTIVERT) tablet 12.5 mg  12.5 mg Per Tube BID  PRN Awilda Bill, NP   12.5 mg at 02/09/20 2302  . MEDLINE mouth rinse  15 mL Mouth Rinse q12n4p Flora Lipps, MD   15 mL at 02/10/20 1620  . melatonin tablet 5 mg  5 mg Per Tube QHS Pabon, Diego F, MD   5 mg at 02/09/20 2221  . multivitamin with minerals tablet 1 tablet  1 tablet Per Tube Daily Jules Husbands, MD   1 tablet at 02/10/20 1059  . ondansetron (ZOFRAN) injection 4 mg  4 mg Intravenous Q6H PRN Caroleen Hamman F, MD   4 mg at 01/30/20 1746  . pantoprazole sodium (PROTONIX) 40 mg/20 mL oral suspension 40 mg  40 mg Per Tube QHS Benita Gutter, RPH   40 mg at 02/09/20 2221  . polyvinyl alcohol (LIQUIFILM TEARS) 1.4 % ophthalmic solution 1 drop  1 drop Both Eyes PRN Flora Lipps, MD   1 drop at 02/03/20 1324  . pravastatin (PRAVACHOL) tablet 20 mg  20 mg Per NG tube q1800 Pabon, Diego F, MD   20 mg at 02/10/20 1738  . rOPINIRole (REQUIP) tablet 0.5 mg  0.5 mg Per Tube QHS Pabon, Diego F, MD   0.5 mg at 02/09/20 2222     Discharge  Medications: Please see discharge summary for a list of discharge medications.  Relevant Imaging Results:  Relevant Lab Results:   Additional Information SS# [id number]  Adelene Amas, LCSWA

## 2020-02-10 NOTE — Progress Notes (Signed)
NAME:  Angela David, MRN:  [id number], DOB:  [birth year], LOS: 69 ADMISSION DATE:  01/23/2020, CONSULTATION DATE:  01/23/2020 REFERRING MD:  Dr. Archie Balboa, CHIEF COMPLAINT:  Altered Mental Status, Hypoxia   Brief History   79 yo female with 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 presenting with AMS and Acute hypoxic & hypercarbic respiratory failure requiring mechanical ventilation admitted to the ICU. Noncompliant with home nocturnal vent for months,  status post gastrostomy tube.  History of present illness   79 yo female presented to the emergency department from home because of family concerns regarding worsening altered mental status.  Upon arrival to the ED patient was unable to give any history due to lethargy.  She had a recent admission at [facility] for altered mental status and suspected aspiration pneumonia.  The patient was discharged 2 days ago on 01/21/2020 and family reported via ED documentation that the patient has undergone a drastic mental status change.  ED work-up included a CT head which was negative for any acute abnormality. ABG revealed respiratory acidosis with hypoxia and hypercarbia and lab work showed leukocytosis.  Initial lactic acid was unimpressive at 1.0, repeat 1.9, cultures and procalcitonin are pending.  Chest x-ray shows no significant interval change, with hazy lung markings and unchanged potential opacity vs small left lower lobe pleural effusion.  PCCM team consulted for further management and monitoring with admission to the ICU  Past Medical History  Tracheal stenosis s/p tracheostomy Breast Cancer - 2002 T2DM HLD Glaucoma GERD HTN  Significant Hospital Events   12/15: Tolerating trach collar trials during day and home vent at night; initial plan to d/c home with home health; but PT feels pt needs SNF and rehab  Consults:  PCCM  Procedures:  01/29/20: Gastrostomy tube placed by General Surgery  Significant Diagnostic Tests:  01/23/20 CT head w/o contrast  >> no acute abnormality  01/23/20: CXR>>The tracheostomy tube is stable in positioning. The heart size is unchanged. Hazy coarse lung markings are again noted. Areas of fibrosis and scarring are noted at the lung apices. There is elevation of the right hemidiaphragm. Aortic calcifications are Noted. 01/24/20: Abdominal X-ray>>The OG tube projects over the gastric body. The bowel gas pattern is nonobstructive.  Micro Data:  01/23/20 COVID - 19 >> negative 01/23/20 Influenza A/B >> negative 01/23/20 Blood cultures x 2 >>negative 01/23/20 Urine culture >>no growth 01/23/20 Tracheal aspirate >>Normal respiratory [place]  Antimicrobials:  01/23/20 Cefepime >>11/28 01/23/20 Vancomycin >>11/28  Interim history/subjective:  Pt tolerating trach collar during the day, and home vent settings at night Initial plan to d/c pt home with home health, but PT recommending SNF and rehab Hemodynamically stable Denies all complaints Reports she is ready to go home  Objective   Blood pressure 123/69, pulse 89, temperature 99 F (37.2 C), temperature source Oral, resp. rate 16, height 5' 4.02" (1.626 m), weight 69 kg, SpO2 98 %.    FiO2 (%):  [35 %] 35 %   Intake/Output Summary (Last 24 hours) at 02/10/2020 1318 Last data filed at 02/10/2020 1100 Gross per 24 hour  Intake 1250 ml  Output 365 ml  Net 885 ml   Filed Weights   02/07/20 0500 02/08/20 0500 02/09/20 0500  Weight: 68.9 kg 68 kg 69 kg    Examination: General: Chronically ill-appearing female, sitting in bed, on trach collar, no acute distress HENT: Atraumatic, normocephalic, neck supple, no JVD, tracheostomy midline Lungs: Clear to auscultation bilaterally, no wheezing or rhonchi, even, nonlabored Cardiovascular: Regular rate and rhythm, S1-S2,  no murmurs, rubs, gallops, 2+ distal pulses Abdomen: Soft, nontender, nondistended, no guarding or rebound tenderness, bowel sounds positive x4, PEG tube in place clean dry and  intact Extremities: Generalized weakness, no deformities, no edema Neuro: Awake, alert, oriented, follows commands, no focal deficits Skin: Warm and dry.  No obvious rashes, lesions, ulcerations  Resolved Hospital Problem list   Acute Metabolic Encephalopathy  Assessment & Plan:   Acute Hypoxic&Hypercapnic Respiratory Failure  Hypercapnia due to severe muscle weakness -Supplemental O2 as needed to maintain O2 sats >92% -Trach collar trials during the day with Home vent qhs ~ seems to be tolerating -InitialPlan is for her to go home with the vent use during the night and trach collar during the day ~ PT is recommending SNF with rehab   Acute encephalopathy due to hypercapnea>>resolved -Provide supportive care   Peripheral neuropathy being seen by neurologist,. No further MRI this admission as per neurologist. . Reportedly has an appointment with a neurologist on 12/29as per her husband.  Diabetes mellitus  -CBG's -SSI and Lantus -Follow ICU Hypo/hyperglycemia protocol   GI PROPHYLAXIS as indicated   NUTRITIONAL STATUS DIET-->TF's as tolerated Constipation protocol as indicated   ELECTROLYTES -follow labs as needed -replace as needed -pharmacy consultation and following  Best practice (evaluated daily)   Diet: NPO, tube feedings Pain/Anxiety/Delirium protocol (if indicated): N/a VAP protocol (if indicated): Yes DVT prophylaxis: Lovenox GI prophylaxis: Protonix Glucose control: SSI Mobility: AS tolerated last date of multidisciplinary goals of care discussion: 02/10/20 Family and staff present: Updated pt and husband at bedside 12/15. Summary of discussion: Plan for discharge to home with home health vs. SNF with rehab Follow up goals of care discussion due: 12/16 Code Status: Limited code (NO CPR) Disposition: ICU  Labs   CBC: Recent Labs  Lab 02/06/20 0521 02/07/20 0536 02/08/20 0445 02/09/20 0824 02/10/20 0559  WBC 9.2 9.7 10.0 9.5  11.8*  NEUTROABS 6.8 7.3 6.9 7.0 8.8*  HGB 10.8* 11.2* 11.2* 10.9* 10.7*  HCT 34.7* 35.5* 36.1 34.4* 33.9*  MCV 83.0 83.1 83.8 82.1 80.7  PLT 262 267 289 302 244    Basic Metabolic Panel: Recent Labs  Lab 02/05/20 0629 02/06/20 0521 02/07/20 0536 02/08/20 0445 02/09/20 0824 02/10/20 0559  NA 137 136 137 140  --  138  K 4.4 4.5 4.3 4.7  --  3.7  CL 96* 94* 94* 96*  --  98  CO2 30 32 32 32  --  29  GLUCOSE 181* 152* 176* 154*  --  147*  BUN 24* 27* 30* 30*  --  31*  CREATININE 0.79 0.89 0.94 0.91  --  0.97  CALCIUM 10.1 10.0 10.0 10.0  --  9.4  MG 1.8 2.1 2.2 2.2 2.2 2.2  PHOS 3.9 4.8* 4.4 3.9 4.0 2.6   GFR: Estimated Creatinine Clearance: 46.3 mL/min (by C-G formula based on SCr of 0.97 mg/dL). Recent Labs  Lab 02/07/20 0536 02/08/20 0445 02/09/20 0824 02/10/20 0559  WBC 9.7 10.0 9.5 11.8*    Liver Function Tests: No results for input(s): AST, ALT, ALKPHOS, BILITOT, PROT, ALBUMIN in the last 168 hours. No results for input(s): LIPASE, AMYLASE in the last 168 hours. No results for input(s): AMMONIA in the last 168 hours.  ABG    Component Value Date/Time   PHART 7.44 02/03/2020 1055   PCO2ART 53 (H) 02/03/2020 1055   PO2ART 92 02/03/2020 1055   HCO3 36.0 (H) 02/03/2020 1055   ACIDBASEDEF 0.9 01/19/2020 1024   O2SAT 97.4  02/03/2020 1055     Coagulation Profile: No results for input(s): INR, PROTIME in the last 168 hours.  Cardiac Enzymes: No results for input(s): CKTOTAL, CKMB, CKMBINDEX, TROPONINI in the last 168 hours.  HbA1C: Hgb A1c MFr Bld  Date/Time Value Ref Range Status  01/20/2020 07:19 AM 6.3 (H) 4.8 - 5.6 % Final    Comment:    (NOTE) Pre diabetes:          5.7%-6.4%  Diabetes:              >6.4%  Glycemic control for   <7.0% adults with diabetes   03/21/2019 03:24 AM 6.6 (H) 4.8 - 5.6 % Final    Comment:    (NOTE) Pre diabetes:          5.7%-6.4% Diabetes:              >6.4% Glycemic control for   <7.0% adults with diabetes      CBG: Recent Labs  Lab 02/09/20 1942 02/09/20 2348 02/10/20 0330 02/10/20 0737 02/10/20 1115  GLUCAP 136* 151* 134* 150* 143*    Review of Systems:   Positives in BOLD: Pt currently denies all complaints Gen: Denies fever, chills, weight change, fatigue, night sweats HEENT: Denies blurred vision, double vision, hearing loss, tinnitus, sinus congestion, rhinorrhea, sore throat, neck stiffness, dysphagia PULM: Denies shortness of breath, cough, sputum production, hemoptysis, wheezing CV: Denies chest pain, edema, orthopnea, paroxysmal nocturnal dyspnea, palpitations GI: Denies abdominal pain, nausea, vomiting, diarrhea, hematochezia, melena, constipation, change in bowel habits GU: Denies dysuria, hematuria, polyuria, oliguria, urethral discharge Endocrine: Denies hot or cold intolerance, polyuria, polyphagia or appetite change Derm: Denies rash, dry skin, scaling or peeling skin change Heme: Denies easy bruising, bleeding, bleeding gums Neuro: Denies headache, numbness, weakness, slurred speech, loss of memory or consciousness   Past Medical History  She,  has a past medical history of Breast cancer ([place]) (2002), Cancer ([place]) (2002), Diabetes mellitus without complication ([place]), Diabetic peripheral neuropathy ([place]) (08/28/2016), GERD (gastroesophageal reflux disease), Glaucoma, High cholesterol, History of colon polyps, Peripheral neuropathy (08/28/2016), Personal history of chemotherapy (2002), Personal history of malignant neoplasm of breast, Personal history of radiation therapy (2002), and Vocal cord paralysis (2012).   Surgical History    Past Surgical History:  Procedure Laterality Date  . ABDOMINAL HYSTERECTOMY    . BREAST BIOPSY Right 2008   neg  . BREAST EXCISIONAL BIOPSY Right 2002   positive  . BREAST LUMPECTOMY Right 2002   chemo and radiation  . BREAST SURGERY Right 2002   LUMPECTOMY, radiation, chemotherapy  . CHOLECYSTECTOMY N/A 12/10/2017   Procedure:  LAPAROSCOPIC CHOLECYSTECTOMY;  Surgeon: Benjamine Sprague, DO;  Location: [facility] ORS;  Service: General;  Laterality: N/A;  . COLON RESECTION Right 11/06/2018   Procedure: HAND ASSISTED LAPAROSCOPIC COLON RESECTION, RIGHT;  Surgeon: Jules Husbands, MD;  Location: [facility] ORS;  Service: General;  Laterality: Right;  . COLONOSCOPY  2009,12/30/2013  . COLONOSCOPY WITH PROPOFOL N/A 04/16/2018   Tubulovillous polyp proximal transverse colon, large sessile polyp, proximal transverse colon;  Surgeon: Robert Bellow, MD;  Location: [facility] ENDOSCOPY;  Service: Endoscopy;  Laterality: N/A;  . ESOPHAGOGASTRODUODENOSCOPY (EGD) WITH PROPOFOL N/A 10/14/2018   Procedure: ESOPHAGOGASTRODUODENOSCOPY (EGD) WITH PROPOFOL;  Surgeon: Lucilla Lame, MD;  Location: [facility] ENDOSCOPY;  Service: Endoscopy;  Laterality: N/A;  . HYDROCELE EXCISION / REPAIR    . TRACHEAL SURGERY  2013  . TRACHEOSTOMY N/A    Dr Kathyrn Sheriff     Social History   reports  that she has never smoked. She has never used smokeless tobacco. She reports that she does not drink alcohol and does not use drugs.   Family History   Her family history includes Cancer in her brother, father, and sister. There is no history of Breast cancer.   Allergies Allergies  Allergen Reactions  . Shellfish Allergy Shortness Of Breath    respitatory distress   . Sodium Sulfite Shortness Of Breath    Severe Congestion that creates inability to breath  . Sulfa Antibiotics Other (See Comments) and Shortness Of Breath    Other reaction(s): OTHER Other reaction(s): Other (See Comments) respitatory distress  Other reaction(s): OTHER respitatory distress  Other reaction(s): OTHER Other reaction(s): Other (See Comments) respitatory distress  respitatory distress   . Codeine Other (See Comments)    Per patient "it kept her up all night"     Home Medications  Prior to Admission medications   Medication Sig Start Date End Date Taking? Authorizing Provider  acetaminophen  (TYLENOL) 325 MG tablet Take 2 tablets (650 mg total) by mouth every 6 (six) hours as needed for moderate pain or fever. 04/01/19  Yes Tyler Pita, MD  allopurinol (ZYLOPRIM) 100 MG tablet Take 100 mg by mouth daily.   Yes [provider]  amLODipine (NORVASC) 10 MG tablet Take 1 tablet (10 mg total) by mouth daily. 01/22/20  Yes Fritzi Mandes, MD  busPIRone (BUSPAR) 15 MG tablet Take 30 mg by mouth 2 (two) times daily.   Yes [provider]  Calcium Carbonate-Vitamin D 600-400 MG-UNIT tablet Take 1 tablet by mouth daily.    Yes [provider]  escitalopram (LEXAPRO) 10 MG tablet Take 10 mg by mouth daily. (take with 20mg  tablet to equal 30mg  total daily)   Yes [provider]  escitalopram (LEXAPRO) 20 MG tablet Take 20 mg by mouth daily. (take with 10mg  tablet to equal 30mg  total daily)   Yes [provider]  fluconazole (DIFLUCAN) 150 MG tablet Take 150 mg by mouth 2 (two) times a week.   Yes [provider]  folic acid (FOLVITE) 1 MG tablet Take 1 mg by mouth daily.   Yes [provider]  gabapentin (NEURONTIN) 600 MG tablet Take 600 mg by mouth 3 (three) times daily. 05/26/19  Yes [provider]  lovastatin (MEVACOR) 20 MG tablet Take 20 mg by mouth at bedtime.   Yes [provider]  Melatonin 5 MG TABS Take 1 tablet (5 mg total) by mouth at bedtime. 04/01/19  Yes Tyler Pita, MD  meloxicam (MOBIC) 15 MG tablet Take 15 mg by mouth daily. 01/01/20  Yes [provider]  metoprolol tartrate (LOPRESSOR) 25 MG tablet Take 1 tablet (25 mg total) by mouth 2 (two) times daily. Patient taking differently: Take 25 mg by mouth daily.  04/01/19  Yes Tyler Pita, MD  pantoprazole (PROTONIX) 40 MG tablet Take 40 mg by mouth daily.    Yes [provider]  rOPINIRole (REQUIP) 0.5 MG tablet Take 0.5 mg by mouth at bedtime.  06/09/18  Yes [provider]  scopolamine (TRANSDERM-SCOP) 1 MG/3DAYS  Place 1 patch (1.5 mg total) onto the skin every 3 (three) days. 01/24/20  Yes Fritzi Mandes, MD  sodium chloride 0.9 % nebulizer solution Take 3 mLs by nebulization as needed for wheezing.  07/17/19  Yes [provider]  feeding supplement (ENSURE ENLIVE / ENSURE PLUS) LIQD Take 237 mLs by mouth 3 (three) times daily between meals.  01/21/20   Fritzi Mandes, MD  Nebulizers (COMPRESSOR/NEBULIZER) MISC Nebulizer with supplies, trach collar for med admin. 04/01/19   Tyler Pita, MD     Critical care time: 35 minutes     Darel Hong, [facility] [place] [facility] Pager: [phone]

## 2020-02-10 NOTE — Progress Notes (Signed)
Nutrition Follow-up  DOCUMENTATION CODES:   Not applicable  INTERVENTION:  Continue Glucerna 1.5 Cal at 50 mL/hr (1200 mL goal daily volume) via G-tube. Provides 1800 kcal, 99 grams of protein, 19 grams of fiber, 912 mL H2O daily.  With free water flush of 100 mL Q6hrs patient receives a total of 1312 mL H2O daily. Patient also receives water flushes with medications.  NUTRITION DIAGNOSIS:   Inadequate oral intake related to inability to eat as evidenced by NPO status.  Ongoing.  GOAL:   Patient will meet greater than or equal to 90% of their needs  Met with TF regimen.  MONITOR:   Labs,Weight trends,TF tolerance,Skin,I & O's  REASON FOR ASSESSMENT:   Ventilator,Consult Enteral/tube feeding initiation and management  ASSESSMENT:   79 year old female with PMHx of breast cancer s/p lumpectomy and chemo/XRT, DM, diabetic peripheral neuropathy, GERD, vocal cord paralysis, tracheal stenosis requiring chronic tracheostomy, recent admission 11/19-11/25 admitted with acute hypoxic and hypercapnic respiratory failure secondary to suspected aspiration PNA, acute encephalopathy.  11/27 placed on ventilator support via tracheostomy 11/28 adult TF protocol initiated 11/29 transitioned to trach collar 12/3 s/p robotic assisted laparoscopic G-tube placement with lysis of adhesions 12/5 transferred to ICU after rapid response and placed back on ventilator  Patient has been working on weaning to trach collar during the day and vent support at night. Per MD patient may also require vent support PRN during the day. Patient's home vent was brought in yesterday to see if she tolerated and for teaching. Plan is for patient to discharge home with home health. She has been tolerating continuous feeds well. Due to increased risk of aspiration with bolus feeds recommend continuing with continuous feeds at this time. Attempted to meet with patient this AM but other providers were in room. At second  attempt patient was sleeping. Discussed with Thedore Mins from [place] and plan is for likely discharge home today. Patient is being set up with continuous tube feed supplies. RN has been providing teaching with patient's husband.  Medications reviewed and include: folic acid 1 mg daily per tube, free water 100 mL Q6hrs, Novolog 0-15 units Q4hrs, Lantus 10 units QHS, MVI daily, Protonix.  Labs reviewed: CBG 134-151, BUN 31.  I/O: 595 mL UOP yesterday + 2 occurrences unmeasured UOP  Weight trend: 69 kg on 12/14; -1.6 kg from 12/8  Patient with non-pitting edema per RN documentation. Since 12/1 patient is +6829.7 mL per I/O  Enteral Access: 16 Fr. Angelica Ran placed 12/3 by surgery  TF regimen: Glucerna 1.5 Cal at 50 mL/hr  Discussed with RN and on rounds.  Diet Order:   Diet Order            Diet NPO time specified Except for: Other (See Comments)  Diet effective now                EDUCATION NEEDS:   No education needs have been identified at this time  Skin:  Skin Assessment: Skin Integrity Issues: Skin Integrity Issues:: Incisions Incisions: closed incision to abdomen  Last BM:  02/10/2020 - medium type 6  Height:   Ht Readings from Last 1 Encounters:  02/03/20 5' 4.02" (1.626 m)   Weight:   Wt Readings from Last 1 Encounters:  02/09/20 69 kg   Ideal Body Weight:  54.5 kg  BMI:  Body mass index is 26.1 kg/m.  Estimated Nutritional Needs:   Kcal:  1600-1800kcal/day  Protein:  95-110 grams  Fluid:  1.6L/day  Angela David  Edison Pace, MS, RD, LDN Pager number available on Amion

## 2020-02-10 NOTE — Progress Notes (Signed)
Pt on Home vent Trilogy EVO , plased BY husbend

## 2020-02-10 NOTE — TOC Progression Note (Signed)
Transition of Care [facility]) - Progression Note    Patient Details  Name: Jama Mcmiller MRN: [id number] Date of Birth: 1940/09/04  Transition of Care [facility]) CM/SW [place], [place] Phone Number: [phone] 02/10/2020, 9:06 PM  Clinical Narrative:     CSW received IM chat from ICU RN requesting update on patient d/c.  CSW updated ICU RN the discharge was pending the arrival fo the Respiratory Therapist to educate the patient's spouse on trach and vent.  ICU RN reached out to CSw concerning her concerns about patient's spouse uncertainty to care for the patient at home.  CSw acknowledged concerns and stated the patient and her spouse both refused SNF placement when we discussed it.  CSW reached out to Attending and requested update on patient discharge status.  Attending stated the patient and her spouse were expressing concerns about returning home after PT assessment and recommendation for SNF placement. Attending stated patient will remain in the ICU and requested a SNF/LTACH placement search.  CSW acknowledged requested and will begin placement process.  CSW will also speak with patient and her spouse about placement process and timeline.  ICU RN informed CSW the patient did not was to be placed at [facility].  Expected Discharge Plan: [place] Barriers to Discharge: Continued Medical Work up  Expected Discharge Plan and Services Expected Discharge Plan: [place] Choice: [place] arrangements for the past 2 months: Single Family Home                                       Social Determinants of Health (SDOH) Interventions    Readmission Risk Interventions No flowsheet data found.

## 2020-02-10 NOTE — TOC Progression Note (Signed)
Transition of Care [facility]) - Progression Note    Patient Details  Name: Angela David MRN: [id number] Date of Birth: [birth year]  Transition of Care [facility]) CM/SW [place], [place] Phone Number: [phone] 02/10/2020, 9:00 PM  Clinical Narrative:     CSW spoke with patient and updated her on the readiness for discharge.  Patient stated she was anxious and ready to go home and that she was looking forward to seeing her dog Laurey Arrow. CSW that everything was in order for discharge and all was needed was for her spouse to have education on trach care, feeding tubes and vent care.  CSW stated once I received confirmation from the Attending or ICU RN, then I would contact EMS for transportation and update [place] on discharge.  Patient verbalized understanding.  Expected Discharge Plan: [place] Barriers to Discharge: Continued Medical Work up  Expected Discharge Plan and Services Expected Discharge Plan: [place] Choice: [place] arrangements for the past 2 months: Single Family Home                                       Social Determinants of Health (SDOH) Interventions    Readmission Risk Interventions No flowsheet data found.

## 2020-02-10 NOTE — TOC Progression Note (Signed)
Transition of Care [facility]) - Progression Note    Patient Details  Name: Angela David MRN: [id number] Date of Birth: [birth year]  Transition of Care [facility]) CM/SW [place], [place] Phone Number:  [phone] 02/10/2020, 8:48 PM  Clinical Narrative:     CSW reached out to [place] from [place] and Corene Cornea from [place] confirmed assistance with DME ; vent and home health services, RN, PT, and [place]. Thedore Mins stated he would meet with the patient's spouse [name] to speak with him with the overnight vent trial.  Corene Cornea stated he would continue to follow the patient to request a schedule for home health when the patient was ready for d/c.  During round the patient's tube feeds were discussed and the feeds would be discussed with the patient spouse. CSW went to speak with the patient but she was sleeping and her husband was not in the room.  Expected Discharge Plan: [place] Barriers to Discharge: Continued Medical Work up  Expected Discharge Plan and Services Expected Discharge Plan: [place] Choice: [place] arrangements for the past 2 months: Single Family Home                                       Social Determinants of Health (SDOH) Interventions    Readmission Risk Interventions No flowsheet data found.

## 2020-02-10 NOTE — Progress Notes (Signed)
Physical Therapy Treatment Patient Details Name: Angela David MRN: [id number] DOB: March 27, 1940 Today's Date: 02/10/2020    History of Present Illness Pt is a 79 y/o F her 11/19 with c/o hallucinations x 1 week. Imaging negative. Pt admitted for tx of acute encephalopathy of unclear etiology but suspect due to RLL PNA. (Pt also recently seen in ED on 01/11/20 s/p fall & found to have R clavicular fx & sent home with R shoulder immobilizer, instructed to f/u with ortho on OP basis.) PMH: R breast CA (2002), DM, peripheral neuropathy, glaucoma, high cholesterol, vocal cord paralysis. Rapid response called and pt transferred to CCU, on vent.    PT Comments    Nursing present during session for pt safety with mobility and for line/lead/tube management.  Pt was pleasant and motivated to participate during the session and put forth good effort throughout.  Pt reported mild dizziness upon sitting to EOB with BP taken and WNL with symptoms quickly resolving.  Pt required physical assistance with all functional mobility tasks and presented with posterior lean in sitting and in standing.  Pt participated in anterior weight shifting activities in sitting which grossly improved static sitting balance.  Pt required physical assistance to prevent posterior LOB in standing as well and was only able to take several very small shuffling steps at the EOB before returning to sitting.  Pt given therapeutic rest break before ambulating a second time with similar results.  Pt's SpO2 WNL throughout the session other than one brief instance where it dropped to the 70s but almost immediately jumped back to the 90s, nursing aware.  Pt will benefit from PT services in a SNF setting upon discharge to safely address deficits listed in patient problem list for decreased caregiver assistance and eventual return to PLOF.     Follow Up Recommendations  SNF;Supervision/Assistance - 24 hour     Equipment Recommendations   Other (comment) (TBD at next venue of care)    Recommendations for Other Services       Precautions / Restrictions Precautions Precautions: Fall Precaution Comments: vent Restrictions Weight Bearing Restrictions: Yes RUE Weight Bearing: Non weight bearing Other Position/Activity Restrictions: Pt with findings of R clavicle fx on 01/11/20, sent home with shoulder immobilizer & told to f/u with ortho as outpatient but unable to do this since readmission to hospital. Therapy electing to maintain NWB, no ROM R shoulder    Mobility  Bed Mobility Overal bed mobility: Needs Assistance Bed Mobility: Supine to Sit;Sit to Supine;Rolling Rolling: Min assist;Mod assist   Supine to sit: Min assist;HOB elevated Sit to supine: Min assist   General bed mobility comments: Min A for BLE and trunk control  Transfers Overall transfer level: Needs assistance Equipment used: 1 person hand held assist Transfers: Sit to/from Stand Sit to Stand: Mod assist;+2 physical assistance         General transfer comment: Pt required +2 Mod A to stand and to prevent posterior LOB while in standing with LUE HH support and +2 on the gait belt.  Ambulation/Gait Ambulation/Gait assistance: Mod assist;+2 physical assistance Gait Distance (Feet): 2 Feet x 2 Assistive device: 1 person hand held assist Gait Pattern/deviations: Step-to pattern;Shuffle Gait velocity: decreased   General Gait Details: +2 Mod A for stability to prevent posterior LOB with LUE HH support and +2 on the gait belt   Stairs             Wheelchair Mobility    Modified Rankin (Stroke Patients Only)  Balance Overall balance assessment: Needs assistance Sitting-balance support: No upper extremity supported;Feet supported Sitting balance-Leahy Scale: Poor Sitting balance - Comments: posterior lean throughout Postural control: Posterior lean Standing balance support: During functional activity;Bilateral upper extremity  supported Standing balance-Leahy Scale: Poor Standing balance comment: posterior lean in standing with physical assist required to prevent LOB                            Cognition Arousal/Alertness: Awake/alert Behavior During Therapy: WFL for tasks assessed/performed Overall Cognitive Status: Within Functional Limits for tasks assessed                                        Exercises Total Joint Exercises Ankle Circles/Pumps: Strengthening;Both;5 reps;10 reps (with manual resistance) Quad Sets: Strengthening;Both;5 reps;10 reps Gluteal Sets: Strengthening;Both;10 reps;5 reps Heel Slides: Strengthening;Both;10 reps Hip ABduction/ADduction: Strengthening;Both;10 reps (with manual resistance) Straight Leg Raises: Strengthening;Both;10 reps Long Arc Quad: Strengthening;Both;10 reps Other Exercises Other Exercises: HEP education for BLE APs, QS, and GS x 10 each every 1-2 hours dails Other Exercises: Anterior weight shifting activities in sitting to address posterior lean  Rolling left/right for bed mobility and core therex    General Comments        Pertinent Vitals/Pain Pain Assessment: No/denies pain    Home Living                      Prior Function            PT Goals (current goals can now be found in the care plan section) Progress towards PT goals: Progressing toward goals    Frequency    Min 2X/week      PT Plan Current plan remains appropriate    Co-evaluation              AM-PAC PT "6 Clicks" Mobility   Outcome Measure  Help needed turning from your back to your side while in a flat bed without using bedrails?: A Lot Help needed moving from lying on your back to sitting on the side of a flat bed without using bedrails?: A Lot Help needed moving to and from a bed to a chair (including a wheelchair)?: A Lot Help needed standing up from a chair using your arms (e.g., wheelchair or bedside chair)?: A Lot Help  needed to walk in hospital room?: Total Help needed climbing 3-5 steps with a railing? : Total 6 Click Score: 10    End of Session Equipment Utilized During Treatment: Gait belt;Oxygen Activity Tolerance: Patient tolerated treatment well Patient left: in bed;with call bell/phone within reach;with nursing/sitter in room;with family/visitor present Nurse Communication: Mobility status;Weight bearing status PT Visit Diagnosis: Muscle weakness (generalized) (M62.81);History of falling (Z91.81);Unsteadiness on feet (R26.81);Difficulty in walking, not elsewhere classified (R26.2)     Time: 1452-1530 PT Time Calculation (min) (ACUTE ONLY): 38 min  Charges:  $Gait Training: 8-22 mins $Therapeutic Exercise: 8-22 mins $Therapeutic Activity: 8-22 mins                     D. Scott Thomson Herbers PT, DPT 02/10/20, 4:08 PM

## 2020-02-11 LAB — CBC WITH DIFFERENTIAL/PLATELET
Abs Immature Granulocytes: 0.2 10*3/uL — ABNORMAL HIGH (ref 0.00–0.07)
Basophils Absolute: 0.1 10*3/uL (ref 0.0–0.1)
Basophils Relative: 1 %
Eosinophils Absolute: 0.3 10*3/uL (ref 0.0–0.5)
Eosinophils Relative: 3 %
HCT: 33.1 % — ABNORMAL LOW (ref 36.0–46.0)
Hemoglobin: 10.7 g/dL — ABNORMAL LOW (ref 12.0–15.0)
Immature Granulocytes: 2 %
Lymphocytes Relative: 20 %
Lymphs Abs: 1.9 10*3/uL (ref 0.7–4.0)
MCH: 26.1 pg (ref 26.0–34.0)
MCHC: 32.3 g/dL (ref 30.0–36.0)
MCV: 80.7 fL (ref 80.0–100.0)
Monocytes Absolute: 0.6 10*3/uL (ref 0.1–1.0)
Monocytes Relative: 7 %
Neutro Abs: 6.7 10*3/uL (ref 1.7–7.7)
Neutrophils Relative %: 67 %
Platelets: 300 10*3/uL (ref 150–400)
RBC: 4.1 MIL/uL (ref 3.87–5.11)
RDW: 13.7 % (ref 11.5–15.5)
WBC: 9.7 10*3/uL (ref 4.0–10.5)
nRBC: 0 % (ref 0.0–0.2)

## 2020-02-11 LAB — BASIC METABOLIC PANEL
Anion gap: 13 (ref 5–15)
BUN: 30 mg/dL — ABNORMAL HIGH (ref 8–23)
CO2: 26 mmol/L (ref 22–32)
Calcium: 9.2 mg/dL (ref 8.9–10.3)
Chloride: 101 mmol/L (ref 98–111)
Creatinine, Ser: 0.96 mg/dL (ref 0.44–1.00)
GFR, Estimated: >60 mL/min (ref >60)
Glucose, Bld: 164 mg/dL — ABNORMAL HIGH (ref 70–99)
Potassium: 3.6 mmol/L (ref 3.5–5.1)
Sodium: 140 mmol/L (ref 135–145)

## 2020-02-11 LAB — GLUCOSE, CAPILLARY
Glucose-Capillary: 163 mg/dL — ABNORMAL HIGH (ref 70–99)
Glucose-Capillary: 151 mg/dL — ABNORMAL HIGH (ref 70–99)
Glucose-Capillary: 142 mg/dL — ABNORMAL HIGH (ref 70–99)
Glucose-Capillary: 139 mg/dL — ABNORMAL HIGH (ref 70–99)
Glucose-Capillary: 119 mg/dL — ABNORMAL HIGH (ref 70–99)
Glucose-Capillary: 148 mg/dL — ABNORMAL HIGH (ref 70–99)

## 2020-02-11 LAB — MAGNESIUM: Magnesium: 2.3 mg/dL (ref 1.7–2.4)

## 2020-02-11 LAB — PHOSPHORUS: Phosphorus: 3.2 mg/dL (ref 2.5–4.6)

## 2020-02-11 NOTE — Progress Notes (Signed)
Husband stayed throughout the entire shift and was taught how to administer pt's medications through peg tube. He demonstrated effectiveness of administration with all meds except refused to administer novolog insulin injections. He stated pt usually does this herself at home. RT came by and reviewed Home vent (trilogy), as well as suctioning, He verbalized understanding. Patient's vitals remained stable throughout the entire shift and reported no pain or need for suctioning at this time.

## 2020-02-11 NOTE — Progress Notes (Signed)
Pt placed on trach collar tolerating well at this time.

## 2020-02-11 NOTE — Progress Notes (Signed)
Shift note: Pt alert and oriented X4.Able to make needs known. Pleasant and cooperative. Requests valve for trach  to aide in verbal communication. Trach collar in place this shift well tolerated. Cont of b/b requests bed pan as needed. Tolerates continuousTF well. Denies n/v or abdominal discomfort. 02 sat well maintained this shift. No mucous plugs.

## 2020-02-11 NOTE — Progress Notes (Signed)
NAME:  Angela David, MRN:  [id number], DOB:  Jul 08, 1940, LOS: 45 ADMISSION DATE:  01/23/2020, CONSULTATION DATE:  01/23/2020 REFERRING MD:  Dr. 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 Noncompliant with home nocturnal vent for months,  status post gastrostomy tube.  History of present illness   79 yo female presented to the emergency department from home because of family concerns regarding worsening altered mental status.  Upon arrival to the ED patient was unable to give any history due to lethargy.  She had a recent admission at [facility] for altered mental status and suspected aspiration pneumonia.  The patient was discharged 2 days ago on 01/21/2020 and family reported via ED documentation that the patient has undergone a drastic mental status change.  ED work-up included a CT head which was negative for any acute abnormality. ABG revealed respiratory acidosis with hypoxia and hypercarbia and lab work showed leukocytosis.  Initial lactic acid was unimpressive at 1.0, repeat 1.9, cultures and procalcitonin are pending.  Chest x-ray shows no significant interval change, with hazy lung markings and unchanged potential opacity vs small left lower lobe pleural effusion.  PCCM team consulted for further management and monitoring with admission to the ICU  Past Medical History  Tracheal stenosis s/p tracheostomy Breast Cancer - 2002 T2DM HLD Glaucoma GERD HTN  Significant Hospital Events   11/27: Admit to ICU 12/3: Gastrostomy tube placed by General Surgery 12/15: Tolerating trach collar trials during day and home vent at night; initial plan to d/c home with home health; but PT feels pt needs SNF and rehab 12/16: Tolerating trach collar trials, discharge disposition  still in progress  Consults:  PCCM General Surgery  Procedures:  01/29/20: Gastrostomy tube placed by General Surgery  Significant Diagnostic Tests:  01/23/20 CT head w/o contrast >> no acute abnormality  01/23/20: CXR>>The tracheostomy tube is stable in positioning. The heart size is unchanged. Hazy coarse lung markings are again noted. Areas of fibrosis and scarring are noted at the lung apices. There is elevation of the right hemidiaphragm. Aortic calcifications are Noted. 01/24/20: Abdominal X-ray>>The OG tube projects over the gastric body. The bowel gas pattern is nonobstructive. 12/6: CXR>>1. Stable tracheostomy tube. 2. Chronic interstitial changes and streaky areas of atelectasis. No new infiltrates or edema.  Micro Data:  01/23/20 COVID - 19 >> negative 01/23/20 Influenza A/B >> negative 01/23/20 Blood cultures x 2 >>negative 01/23/20 Urine culture >>no growth 01/23/20 Tracheal aspirate >>Normal respiratory [place]ora  Antimicrobials:  01/23/20 Cefepime >>11/28 01/23/20 Vancomycin >>11/28  Interim history/subjective:  No significant events reported overnight Nursing reports husband stayed overnight and participated in administering medications and feeds via PEG, along with managing ventilator Afebrile, hemodynamically stable, back on trach collar this morning  Objective   Blood pressure 126/82, pulse 84, temperature 98.6 F (37 C), temperature source Axillary, resp. rate 14, height 5' 4.02" (1.626 m), weight 68.3 kg, SpO2 99 %.    FiO2 (%):  [28 %] 28 %   Intake/Output Summary (Last 24 hours) at 02/11/2020 0821 Last data filed at 02/11/2020 0300 Gross per 24 hour  Intake 1600 ml  Output 0 ml  Net 1600 ml   Filed Weights   02/08/20 0500 02/09/20 0500 02/11/20 0427  Weight: 68 kg 69 kg 68.3 kg    Examination: General: Chronically ill-appearing  female, sitting in bed, on trach collar, no acute distress HENT: Atraumatic, normocephalic, neck supple, no JVD,  tracheostomy midline Lungs: Clear to auscultation bilaterally, no wheezing or rhonchi, even, nonlabored Cardiovascular: Regular rate and rhythm, S1-S2, no murmurs, rubs, gallops, 2+ distal pulses Abdomen: Soft, nontender, nondistended, no guarding or rebound tenderness, bowel sounds positive x4, PEG tube in place clean dry and intact Extremities: Generalized weakness, no deformities, no edema Neuro: Awake, alert, oriented, follows commands, no focal deficits Skin: Warm and dry.  No obvious rashes, lesions, ulcerations  Resolved Hospital Problem list   Acute Metabolic Encephalopathy  Assessment & Plan:   Chronic Hypoxic&Hypercapnic Respiratory Failure  Hypercapnia due to severe muscle weakness Hx: Tracheal stenosis -Supplemental O2 as needed to maintain O2 sats >92%  -Follow intermittent CXR & ABG as needed  -VAP protocol -Trach collar trials during the day with Home vent qhs ~ seems to be tolerating -Discharge plan is still pending ~ d/c home with home health vs. LTACH or SNR w/ rehab  rehab   Acute encephalopathy due to hypercapnea>>resolved -Provide supportive care   Peripheral neuropathy being seen by neurologist,. No further MRI this admission as per neurologist. . Reportedly has an appointment with a neurologist on 12/29as per her husband.   Diabetes mellitus  -CBG's -SSI and Lantus -Follow ICU Hypo/hyperglycemia protocol   NUTRITIONAL STATUS/DIET -Dietician following, TF's as tolerated -Constipation protocol as indicated   ELECTROLYTES -follow labs as needed -replace as needed -pharmacy consultation and following  Best practice (evaluated daily)   Diet: NPO, tube feedings Pain/Anxiety/Delirium protocol (if indicated): N/a VAP protocol (if indicated): Yes DVT prophylaxis: Lovenox GI prophylaxis: Protonix Glucose control: SSI Mobility: AS tolerated last date of multidisciplinary goals of care discussion: 02/11/20 Family and staff present:  Updated pt and husband at bedside 12/16. Summary of discussion: Plan for discharge to home with home health vs. SNF/LTACH with rehab Follow up goals of care discussion due: 12/17 Code Status: Limited code (NO CPR) Disposition: ICU  Labs   CBC: Recent Labs  Lab 02/07/20 0536 02/08/20 0445 02/09/20 0824 02/10/20 0559 02/11/20 0417  WBC 9.7 10.0 9.5 11.8* 9.7  NEUTROABS 7.3 6.9 7.0 8.8* 6.7  HGB 11.2* 11.2* 10.9* 10.7* 10.7*  HCT 35.5* 36.1 34.4* 33.9* 33.1*  MCV 83.1 83.8 82.1 80.7 80.7  PLT 267 289 302 316 947    Basic Metabolic Panel: Recent Labs  Lab 02/06/20 0521 02/07/20 0536 02/08/20 0445 02/09/20 0824 02/10/20 0559 02/11/20 0417  NA 136 137 140  --  138 140  K 4.5 4.3 4.7  --  3.7 3.6  CL 94* 94* 96*  --  98 101  CO2 32 32 32  --  29 26  GLUCOSE 152* 176* 154*  --  147* 164*  BUN 27* 30* 30*  --  31* 30*  CREATININE 0.89 0.94 0.91  --  0.97 0.96  CALCIUM 10.0 10.0 10.0  --  9.4 9.2  MG 2.1 2.2 2.2 2.2 2.2 2.3  PHOS 4.8* 4.4 3.9 4.0 2.6 3.2   GFR: Estimated Creatinine Clearance: 46.6 mL/min (by C-G formula based on SCr of 0.96 mg/dL). Recent Labs  Lab 02/08/20 0445 02/09/20 0824 02/10/20 0559 02/11/20 0417  WBC 10.0 9.5 11.8* 9.7    Liver Function Tests: No results for input(s): AST, ALT, ALKPHOS, BILITOT, PROT, ALBUMIN in the last 168 hours. No results for input(s): LIPASE, AMYLASE in the last 168 hours. No results for input(s): AMMONIA in the last 168 hours.  ABG  Component Value Date/Time   PHART 7.44 02/03/2020 1055   PCO2ART 53 (H) 02/03/2020 1055   PO2ART 92 02/03/2020 1055   HCO3 36.0 (H) 02/03/2020 1055   ACIDBASEDEF 0.9 01/19/2020 1024   O2SAT 97.4 02/03/2020 1055     Coagulation Profile: No results for input(s): INR, PROTIME in the last 168 hours.  Cardiac Enzymes: No results for input(s): CKTOTAL, CKMB, CKMBINDEX, TROPONINI in the last 168 hours.  HbA1C: Hgb A1c MFr Bld  Date/Time Value Ref Range Status  01/20/2020 07:19  AM 6.3 (H) 4.8 - 5.6 % Final    Comment:    (NOTE) Pre diabetes:          5.7%-6.4%  Diabetes:              >6.4%  Glycemic control for   <7.0% adults with diabetes   03/21/2019 03:24 AM 6.6 (H) 4.8 - 5.6 % Final    Comment:    (NOTE) Pre diabetes:          5.7%-6.4% Diabetes:              >6.4% Glycemic control for   <7.0% adults with diabetes     CBG: Recent Labs  Lab 02/10/20 1643 02/10/20 1934 02/10/20 2334 02/11/20 0334 02/11/20 0732  GLUCAP 137* 144* 132* 163* 151*    Review of Systems:   Positives in BOLD: Pt currently denies all complaints Gen: Denies fever, chills, weight change, fatigue, night sweats HEENT: Denies blurred vision, double vision, hearing loss, tinnitus, sinus congestion, rhinorrhea, sore throat, neck stiffness, dysphagia PULM: Denies shortness of breath, cough, sputum production, hemoptysis, wheezing CV: Denies chest pain, edema, orthopnea, paroxysmal nocturnal dyspnea, palpitations GI: Denies abdominal pain, nausea, vomiting, diarrhea, hematochezia, melena, constipation, change in bowel habits GU: Denies dysuria, hematuria, polyuria, oliguria, urethral discharge Endocrine: Denies hot or cold intolerance, polyuria, polyphagia or appetite change Derm: Denies rash, dry skin, scaling or peeling skin change Heme: Denies easy bruising, bleeding, bleeding gums Neuro: Denies headache, numbness, weakness, slurred speech, loss of memory or consciousness   Past Medical History  She,  has a past medical history of Breast cancer ([place]) (2002), Cancer ([place]) (2002), Diabetes mellitus without complication ([place]), Diabetic peripheral neuropathy ([place]) (08/28/2016), GERD (gastroesophageal reflux disease), Glaucoma, High cholesterol, History of colon polyps, Peripheral neuropathy (08/28/2016), Personal history of chemotherapy (2002), Personal history of malignant neoplasm of breast, Personal history of radiation therapy (2002), and Vocal cord paralysis (2012).    Surgical History    Past Surgical History:  Procedure Laterality Date   ABDOMINAL HYSTERECTOMY     BREAST BIOPSY Right 2008   neg   BREAST EXCISIONAL BIOPSY Right 2002   positive   BREAST LUMPECTOMY Right 2002   chemo and radiation   BREAST SURGERY Right 2002   LUMPECTOMY, radiation, chemotherapy   CHOLECYSTECTOMY N/A 12/10/2017   Procedure: LAPAROSCOPIC CHOLECYSTECTOMY;  Surgeon: Benjamine Sprague, DO;  Location: [facility] ORS;  Service: General;  Laterality: N/A;   COLON RESECTION Right 11/06/2018   Procedure: HAND ASSISTED LAPAROSCOPIC COLON RESECTION, RIGHT;  Surgeon: Jules Husbands, MD;  Location: [facility] ORS;  Service: General;  Laterality: Right;   COLONOSCOPY  2009,12/30/2013   COLONOSCOPY WITH PROPOFOL N/A 04/16/2018   Tubulovillous polyp proximal transverse colon, large sessile polyp, proximal transverse colon;  Surgeon: Robert Bellow, MD;  Location: [facility] ENDOSCOPY;  Service: Endoscopy;  Laterality: N/A;   ESOPHAGOGASTRODUODENOSCOPY (EGD) WITH PROPOFOL N/A 10/14/2018   Procedure: ESOPHAGOGASTRODUODENOSCOPY (EGD) WITH PROPOFOL;  Surgeon: Lucilla Lame, MD;  Location: [facility]  ENDOSCOPY;  Service: Endoscopy;  Laterality: N/A;   HYDROCELE EXCISION / REPAIR     TRACHEAL SURGERY  2013   TRACHEOSTOMY N/A    Dr Kathyrn Sheriff     Social History   reports that she has never smoked. She has never used smokeless tobacco. She reports that she does not drink alcohol and does not use drugs.   Family History   Her family history includes Cancer in her brother, father, and sister. There is no history of Breast cancer.   Allergies Allergies  Allergen Reactions   Shellfish Allergy Shortness Of Breath    respitatory distress    Sodium Sulfite Shortness Of Breath    Severe Congestion that creates inability to breath   Sulfa Antibiotics Other (See Comments) and Shortness Of Breath    Other reaction(s): OTHER Other reaction(s): Other (See Comments) respitatory distress  Other  reaction(s): OTHER respitatory distress  Other reaction(s): OTHER Other reaction(s): Other (See Comments) respitatory distress  respitatory distress    Codeine Other (See Comments)    Per patient "it kept her up all night"     Home Medications  Prior to Admission medications   Medication Sig Start Date End Date Taking? Authorizing Provider  acetaminophen (TYLENOL) 325 MG tablet Take 2 tablets (650 mg total) by mouth every 6 (six) hours as needed for moderate pain or fever. 04/01/19  Yes Tyler Pita, MD  allopurinol (ZYLOPRIM) 100 MG tablet Take 100 mg by mouth daily.   Yes [provider]  amLODipine (NORVASC) 10 MG tablet Take 1 tablet (10 mg total) by mouth daily. 01/22/20  Yes Fritzi Mandes, MD  busPIRone (BUSPAR) 15 MG tablet Take 30 mg by mouth 2 (two) times daily.   Yes [provider]  Calcium Carbonate-Vitamin D 600-400 MG-UNIT tablet Take 1 tablet by mouth daily.    Yes [provider]  escitalopram (LEXAPRO) 10 MG tablet Take 10 mg by mouth daily. (take with 20mg  tablet to equal 30mg  total daily)   Yes [provider]  escitalopram (LEXAPRO) 20 MG tablet Take 20 mg by mouth daily. (take with 10mg  tablet to equal 30mg  total daily)   Yes [provider]  fluconazole (DIFLUCAN) 150 MG tablet Take 150 mg by mouth 2 (two) times a week.   Yes [provider]  folic acid (FOLVITE) 1 MG tablet Take 1 mg by mouth daily.   Yes [provider]  gabapentin (NEURONTIN) 600 MG tablet Take 600 mg by mouth 3 (three) times daily. 05/26/19  Yes [provider]  lovastatin (MEVACOR) 20 MG tablet Take 20 mg by mouth at bedtime.   Yes [provider]  Melatonin 5 MG TABS Take 1 tablet (5 mg total) by mouth at bedtime. 04/01/19  Yes Tyler Pita, MD  meloxicam (MOBIC) 15 MG tablet Take 15 mg by mouth daily. 01/01/20  Yes [provider]  metoprolol tartrate (LOPRESSOR) 25 MG tablet Take 1 tablet (25 mg  total) by mouth 2 (two) times daily. Patient taking differently: Take 25 mg by mouth daily.  04/01/19  Yes Tyler Pita, MD  pantoprazole (PROTONIX) 40 MG tablet Take 40 mg by mouth daily.    Yes [provider]  rOPINIRole (REQUIP) 0.5 MG tablet Take 0.5 mg by mouth at bedtime.  06/09/18  Yes [provider]  scopolamine (TRANSDERM-SCOP) 1 MG/3DAYS Place 1 patch (1.5 mg total) onto the skin every 3 (three) days. 01/24/20  Yes Fritzi Mandes, MD  sodium chloride 0.9 %  nebulizer solution Take 3 mLs by nebulization as needed for wheezing.  07/17/19  Yes [provider]  feeding supplement (ENSURE ENLIVE / ENSURE PLUS) LIQD Take 237 mLs by mouth 3 (three) times daily between meals. 01/21/20   Fritzi Mandes, MD  Nebulizers (COMPRESSOR/NEBULIZER) MISC Nebulizer with supplies, trach collar for med admin. 04/01/19   Tyler Pita, MD     Critical care time: 30 minutes     Darel Hong, [facility] [place] [facility] Pager: [phone]

## 2020-02-12 LAB — BLOOD GAS, ARTERIAL
Acid-Base Excess: 5 mmol/L — ABNORMAL HIGH (ref 0.0–2.0)
Bicarbonate: 35.6 mmol/L — ABNORMAL HIGH (ref 20.0–28.0)
FIO2: 0.35
O2 Saturation: 86.5 %
Patient temperature: 37
pCO2 arterial: 85 mmHg (ref 32.0–48.0)
pH, Arterial: 7.23 — ABNORMAL LOW (ref 7.350–7.450)
pO2, Arterial: 62 mmHg — ABNORMAL LOW (ref 83.0–108.0)

## 2020-02-12 LAB — CBC WITH DIFFERENTIAL/PLATELET
Abs Immature Granulocytes: 0.18 10*3/uL — ABNORMAL HIGH (ref 0.00–0.07)
Basophils Absolute: 0.1 10*3/uL (ref 0.0–0.1)
Basophils Relative: 1 %
Eosinophils Absolute: 0.3 10*3/uL (ref 0.0–0.5)
Eosinophils Relative: 3 %
HCT: 34.1 % — ABNORMAL LOW (ref 36.0–46.0)
Hemoglobin: 10.5 g/dL — ABNORMAL LOW (ref 12.0–15.0)
Immature Granulocytes: 2 %
Lymphocytes Relative: 18 %
Lymphs Abs: 1.6 10*3/uL (ref 0.7–4.0)
MCH: 25.7 pg — ABNORMAL LOW (ref 26.0–34.0)
MCHC: 30.8 g/dL (ref 30.0–36.0)
MCV: 83.4 fL (ref 80.0–100.0)
Monocytes Absolute: 0.6 10*3/uL (ref 0.1–1.0)
Monocytes Relative: 6 %
Neutro Abs: 6.5 10*3/uL (ref 1.7–7.7)
Neutrophils Relative %: 70 %
Platelets: 285 10*3/uL (ref 150–400)
RBC: 4.09 MIL/uL (ref 3.87–5.11)
RDW: 13.7 % (ref 11.5–15.5)
WBC: 9.2 10*3/uL (ref 4.0–10.5)
nRBC: 0 % (ref 0.0–0.2)

## 2020-02-12 LAB — BASIC METABOLIC PANEL
Anion gap: 11 (ref 5–15)
BUN: 27 mg/dL — ABNORMAL HIGH (ref 8–23)
CO2: 28 mmol/L (ref 22–32)
Calcium: 9.3 mg/dL (ref 8.9–10.3)
Chloride: 100 mmol/L (ref 98–111)
Creatinine, Ser: 0.86 mg/dL (ref 0.44–1.00)
GFR, Estimated: >60 mL/min (ref >60)
Glucose, Bld: 157 mg/dL — ABNORMAL HIGH (ref 70–99)
Potassium: 3.9 mmol/L (ref 3.5–5.1)
Sodium: 139 mmol/L (ref 135–145)

## 2020-02-12 LAB — MAGNESIUM: Magnesium: 2.5 mg/dL — ABNORMAL HIGH (ref 1.7–2.4)

## 2020-02-12 LAB — GLUCOSE, CAPILLARY
Glucose-Capillary: 124 mg/dL — ABNORMAL HIGH (ref 70–99)
Glucose-Capillary: 134 mg/dL — ABNORMAL HIGH (ref 70–99)
Glucose-Capillary: 136 mg/dL — ABNORMAL HIGH (ref 70–99)
Glucose-Capillary: 152 mg/dL — ABNORMAL HIGH (ref 70–99)
Glucose-Capillary: 156 mg/dL — ABNORMAL HIGH (ref 70–99)
Glucose-Capillary: 158 mg/dL — ABNORMAL HIGH (ref 70–99)

## 2020-02-12 LAB — PHOSPHORUS: Phosphorus: 3.7 mg/dL (ref 2.5–4.6)

## 2020-02-12 MED ORDER — CALCIUM CARBONATE ANTACID 500 MG PO CHEW
1.0000 | CHEWABLE_TABLET | Freq: Two times a day (BID) | ORAL | Status: DC | PRN
  Administered 2020-02-13: 200 mg via ORAL
  Filled 2020-02-12 (×2): qty 1

## 2020-02-12 NOTE — TOC Progression Note (Addendum)
Transition of Care [facility]) - Progression Note    Patient Details  Name: Roy Snuffer MRN: [id number] Date of Birth: Jul 16, 1940  Transition of Care [facility] CM/SW [place], [place] Phone Number: [phone] 02/12/2020, 3:27 PM  Clinical Narrative:     Patient has bed offer at [facility], as per Raquel Sarna [phone].  CSW spoke with patient and she is agreeable with [facility] placement.  CSW stated the d/c and transport to [facility] could be done tomorrow.  Patient verbalized agreement and understanding.  CSW updated Attending, Agricultural consultant and Unit RN about [facility] placement.  Patient would to transfer via EMS, and is aware they may not be able to provide ETA.  CSW updated Raquel Sarna at [facility] of patient's bed offer acceptance.  Room# 412, MD: Dr. Scarlette Ar, Report# [phone] or [phone]  Expected Discharge Plan: [place] Barriers to Discharge: Continued Medical Work up  Expected Discharge Plan and Services Expected Discharge Plan: [place] Choice: [place] arrangements for the past 2 months: Single Family Home                                       Social Determinants of Health (SDOH) Interventions    Readmission Risk Interventions No flowsheet data found.

## 2020-02-12 NOTE — Progress Notes (Signed)
NAME:  Angela David, MRN:  [id number], DOB:  10-17-1940, LOS: 35 ADMISSION DATE:  01/23/2020, CONSULTATION DATE:  01/23/2020 REFERRING MD:  Dr. 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 Noncompliant with home nocturnal vent for months,  status post gastrostomy tube.  History of present illness   79 yo female presented to the emergency department from home because of family concerns regarding worsening altered mental status.  Upon arrival to the ED patient was unable to give any history due to lethargy.  She had a recent admission at [facility] for altered mental status and suspected aspiration pneumonia.  The patient was discharged 2 days ago on 01/21/2020 and family reported via ED documentation that the patient has undergone a drastic mental status change.  ED work-up included a CT head which was negative for any acute abnormality. ABG revealed respiratory acidosis with hypoxia and hypercarbia and lab work showed leukocytosis.  Initial lactic acid was unimpressive at 1.0, repeat 1.9, cultures and procalcitonin are pending.  Chest x-ray shows no significant interval change, with hazy lung markings and unchanged potential opacity vs small left lower lobe pleural effusion.  PCCM team consulted for further management and monitoring with admission to the ICU  Past Medical History  Tracheal stenosis s/p tracheostomy Breast Cancer - 2002 T2DM HLD Glaucoma GERD HTN  Significant Hospital Events   11/27: Admit to ICU 12/3: Gastrostomy tube placed by General Surgery 12/15: Tolerating trach collar trials during day and home vent at night; initial plan to d/c home with home health; but PT feels pt needs SNF and rehab 12/17: Tolerating trach collar trials, discharge disposition  still in progress  Consults:  PCCM General Surgery  Procedures:  01/29/20: Gastrostomy tube placed by General Surgery  Significant Diagnostic Tests:  01/23/20 CT head w/o contrast >> no acute abnormality  01/23/20: CXR>>The tracheostomy tube is stable in positioning. The heart size is unchanged. Hazy coarse lung markings are again noted. Areas of fibrosis and scarring are noted at the lung apices. There is elevation of the right hemidiaphragm. Aortic calcifications are Noted. 01/24/20: Abdominal X-ray>>The OG tube projects over the gastric body. The bowel gas pattern is nonobstructive. 12/6: CXR>>1. Stable tracheostomy tube. 2. Chronic interstitial changes and streaky areas of atelectasis. No new infiltrates or edema.  Micro Data:  01/23/20 COVID - 19 >> negative 01/23/20 Influenza A/B >> negative 01/23/20 Blood cultures x 2 >>negative 01/23/20 Urine culture >>no growth 01/23/20 Tracheal aspirate >>Normal respiratory [place]ora  Antimicrobials:  01/23/20 Cefepime >>11/28 01/23/20 Vancomycin >>11/28  Interim history/subjective:  No significant events overnight Tolerating trach collar trials and vent at night Hemodynamically stable, afebrile Awaiting discharge disposition, likely will go to SNF w/ Rehab Pt denies all complaints today  Objective   Blood pressure 108/69, pulse 91, temperature 98.7 F (37.1 C), temperature source Oral, resp. rate 14, height 5' 4.02" (1.626 m), weight 70.1 kg, SpO2 97 %.    Vent Mode: PRVC FiO2 (%):  [28 %] 28 % Set Rate:  [16 bmp] 16 bmp Vt Set:  [390 mL] 390 mL PEEP:  [5 cmH20] 5 cmH20   Intake/Output Summary (Last 24 hours) at 02/12/2020 0858 Last data filed at 02/12/2020 0607 Gross per 24 hour  Intake 470 ml  Output --  Net 470 ml   Filed Weights   02/09/20  0500 02/11/20 0427 02/12/20 0208  Weight: 69 kg 68.3 kg 70.1 kg    Examination: General: Chronically ill appearing female, sitting in bed, on trach collar, in NAD HENT:  Atraumatic, normocephalic, neck supple, no JVD, trach midline clear and intact  Lungs: Clear to auscultation bilaterally, even, nonlabored, normal effort Cardiovascular: Regular rate and rhythm, s1s2, no M/R/G, 2+ distal pulses Abdomen: Soft, nontender, nondistended, no guarding or rebound tenderness, BS+ x4, PEG tube in place clean and dry   Extremities: Generalized weakness, no deformities, no edema Neuro: Awake, A&O, follows commands, no focal deficits, mouths words Skin: Warm and dry.  No obvious rashes, lesions, or ulcerations  Resolved Hospital Problem list   Acute Metabolic Encephalopathy  Assessment & Plan:   Chronic Hypoxic&Hypercapnic Respiratory Failure  Hypercapnia due to severe muscle weakness Hx: Tracheal stenosis -Supplemental O2 as needed to maintain O2 sats >92% -Follow intermittent CXR & ABG as needed -VAP protocol -Trach collar during the day with vent qhs (tolerating) -Discharge disposition is still pending ~ d/c home w/ home health vs. SNF w/ rehab   Acute encephalopathy due to hypercapnea>>resolved -Provide supportive care   Peripheral neuropathy being seen by neurologist,. No further MRI this admission as per neurologist. . Reportedly has an appointment with a neurologist on 12/29as per her husband.   Diabetes mellitus  -CBG's =SSI and Lantus -Follow ICU Hypo/hyperglycemia protocol   NUTRITIONAL STATUS/DIET -Dietician following, TF's as tolerated -Constipation protocol as indicated   ELECTROLYTES -follow labs as needed -replace as needed -pharmacy consultation and following  Best practice (evaluated daily)   Diet: NPO, tube feedings Pain/Anxiety/Delirium protocol (if indicated): N/a VAP protocol (if indicated): Yes DVT prophylaxis: Lovenox GI prophylaxis: Protonix Glucose control: SSI Mobility: AS tolerated last date of multidisciplinary goals of care discussion: 02/12/20 Family and staff present: Updated pt and husband at  bedside 12/17 Summary of discussion: Plan for discharge to home with home health vs. SNF with rehab Follow up goals of care discussion due: 12/18 Code Status: Limited code (NO CPR) Disposition: ICU  Labs   CBC: Recent Labs  Lab 02/08/20 0445 02/09/20 0824 02/10/20 0559 02/11/20 0417 02/12/20 0602  WBC 10.0 9.5 11.8* 9.7 9.2  NEUTROABS 6.9 7.0 8.8* 6.7 6.5  HGB 11.2* 10.9* 10.7* 10.7* 10.5*  HCT 36.1 34.4* 33.9* 33.1* 34.1*  MCV 83.8 82.1 80.7 80.7 83.4  PLT 289 302 316 300 426    Basic Metabolic Panel: Recent Labs  Lab 02/07/20 0536 02/08/20 0445 02/09/20 0824 02/10/20 0559 02/11/20 0417 02/12/20 0602  NA 137 140  --  138 140 139  K 4.3 4.7  --  3.7 3.6 3.9  CL 94* 96*  --  98 101 100  CO2 32 32  --  29 26 28   GLUCOSE 176* 154*  --  147* 164* 157*  BUN 30* 30*  --  31* 30* 27*  CREATININE 0.94 0.91  --  0.97 0.96 0.86  CALCIUM 10.0 10.0  --  9.4 9.2 9.3  MG 2.2 2.2 2.2 2.2 2.3 2.5*  PHOS 4.4 3.9 4.0 2.6 3.2 3.7   GFR: Estimated Creatinine Clearance: 52.7 mL/min (by C-G formula based on SCr of 0.86 mg/dL). Recent Labs  Lab 02/09/20 0824 02/10/20 0559 02/11/20 0417 02/12/20 0602  WBC 9.5 11.8* 9.7 9.2    Liver Function Tests: No results for input(s): AST, ALT, ALKPHOS, BILITOT, PROT, ALBUMIN in the last 168 hours. No results for input(s): LIPASE, AMYLASE in the last 168 hours. No results for input(s): AMMONIA in  the last 168 hours.  ABG    Component Value Date/Time   PHART 7.44 02/03/2020 1055   PCO2ART 53 (H) 02/03/2020 1055   PO2ART 92 02/03/2020 1055   HCO3 36.0 (H) 02/03/2020 1055   ACIDBASEDEF 0.9 01/19/2020 1024   O2SAT 97.4 02/03/2020 1055     Coagulation Profile: No results for input(s): INR, PROTIME in the last 168 hours.  Cardiac Enzymes: No results for input(s): CKTOTAL, CKMB, CKMBINDEX, TROPONINI in the last 168 hours.  HbA1C: Hgb A1c MFr Bld  Date/Time Value Ref Range Status  01/20/2020 07:19 AM 6.3 (H) 4.8 - 5.6 % Final     Comment:    (NOTE) Pre diabetes:          5.7%-6.4%  Diabetes:              >6.4%  Glycemic control for   <7.0% adults with diabetes   03/21/2019 03:24 AM 6.6 (H) 4.8 - 5.6 % Final    Comment:    (NOTE) Pre diabetes:          5.7%-6.4% Diabetes:              >6.4% Glycemic control for   <7.0% adults with diabetes     CBG: Recent Labs  Lab 02/11/20 1537 02/11/20 2013 02/11/20 2329 02/12/20 0307 02/12/20 0756  GLUCAP 139* 119* 148* 136* 156*    Review of Systems:   Positives in BOLD: Pt currently denies all complaints Gen: Denies fever, chills, weight change, fatigue, night sweats HEENT: Denies blurred vision, double vision, hearing loss, tinnitus, sinus congestion, rhinorrhea, sore throat, neck stiffness, dysphagia PULM: Denies shortness of breath, cough, sputum production, hemoptysis, wheezing CV: Denies chest pain, edema, orthopnea, paroxysmal nocturnal dyspnea, palpitations GI: Denies abdominal pain, nausea, vomiting, diarrhea, hematochezia, melena, constipation, change in bowel habits GU: Denies dysuria, hematuria, polyuria, oliguria, urethral discharge Endocrine: Denies hot or cold intolerance, polyuria, polyphagia or appetite change Derm: Denies rash, dry skin, scaling or peeling skin change Heme: Denies easy bruising, bleeding, bleeding gums Neuro: Denies headache, numbness, weakness, slurred speech, loss of memory or consciousness   Past Medical History  She,  has a past medical history of Breast cancer ([place]) (2002), Cancer ([place]) (2002), Diabetes mellitus without complication ([place]), Diabetic peripheral neuropathy ([place]) (08/28/2016), GERD (gastroesophageal reflux disease), Glaucoma, High cholesterol, History of colon polyps, Peripheral neuropathy (08/28/2016), Personal history of chemotherapy (2002), Personal history of malignant neoplasm of breast, Personal history of radiation therapy (2002), and Vocal cord paralysis (2012).   Surgical History    Past Surgical  History:  Procedure Laterality Date   ABDOMINAL HYSTERECTOMY     BREAST BIOPSY Right 2008   neg   BREAST EXCISIONAL BIOPSY Right 2002   positive   BREAST LUMPECTOMY Right 2002   chemo and radiation   BREAST SURGERY Right 2002   LUMPECTOMY, radiation, chemotherapy   CHOLECYSTECTOMY N/A 12/10/2017   Procedure: LAPAROSCOPIC CHOLECYSTECTOMY;  Surgeon: Benjamine Sprague, DO;  Location: [facility] ORS;  Service: General;  Laterality: N/A;   COLON RESECTION Right 11/06/2018   Procedure: HAND ASSISTED LAPAROSCOPIC COLON RESECTION, RIGHT;  Surgeon: Jules Husbands, MD;  Location: [facility] ORS;  Service: General;  Laterality: Right;   COLONOSCOPY  2009,12/30/2013   COLONOSCOPY WITH PROPOFOL N/A 04/16/2018   Tubulovillous polyp proximal transverse colon, large sessile polyp, proximal transverse colon;  Surgeon: Robert Bellow, MD;  Location: [facility] ENDOSCOPY;  Service: Endoscopy;  Laterality: N/A;   ESOPHAGOGASTRODUODENOSCOPY (EGD) WITH PROPOFOL N/A 10/14/2018   Procedure: ESOPHAGOGASTRODUODENOSCOPY (EGD) WITH  PROPOFOL;  Surgeon: Lucilla Lame, MD;  Location: [facility] ENDOSCOPY;  Service: Endoscopy;  Laterality: N/A;   HYDROCELE EXCISION / REPAIR     TRACHEAL SURGERY  2013   TRACHEOSTOMY N/A    Dr Kathyrn Sheriff     Social History   reports that she has never smoked. She has never used smokeless tobacco. She reports that she does not drink alcohol and does not use drugs.   Family History   Her family history includes Cancer in her brother, father, and sister. There is no history of Breast cancer.   Allergies Allergies  Allergen Reactions   Shellfish Allergy Shortness Of Breath    respitatory distress    Sodium Sulfite Shortness Of Breath    Severe Congestion that creates inability to breath   Sulfa Antibiotics Other (See Comments) and Shortness Of Breath    Other reaction(s): OTHER Other reaction(s): Other (See Comments) respitatory distress  Other reaction(s): OTHER respitatory distress  Other  reaction(s): OTHER Other reaction(s): Other (See Comments) respitatory distress  respitatory distress    Codeine Other (See Comments)    Per patient "it kept her up all night"     Home Medications  Prior to Admission medications   Medication Sig Start Date End Date Taking? Authorizing Provider  acetaminophen (TYLENOL) 325 MG tablet Take 2 tablets (650 mg total) by mouth every 6 (six) hours as needed for moderate pain or fever. 04/01/19  Yes Tyler Pita, MD  allopurinol (ZYLOPRIM) 100 MG tablet Take 100 mg by mouth daily.   Yes [provider]  amLODipine (NORVASC) 10 MG tablet Take 1 tablet (10 mg total) by mouth daily. 01/22/20  Yes Fritzi Mandes, MD  busPIRone (BUSPAR) 15 MG tablet Take 30 mg by mouth 2 (two) times daily.   Yes [provider]  Calcium Carbonate-Vitamin D 600-400 MG-UNIT tablet Take 1 tablet by mouth daily.    Yes [provider]  escitalopram (LEXAPRO) 10 MG tablet Take 10 mg by mouth daily. (take with 20mg  tablet to equal 30mg  total daily)   Yes [provider]  escitalopram (LEXAPRO) 20 MG tablet Take 20 mg by mouth daily. (take with 10mg  tablet to equal 30mg  total daily)   Yes [provider]  fluconazole (DIFLUCAN) 150 MG tablet Take 150 mg by mouth 2 (two) times a week.   Yes [provider]  folic acid (FOLVITE) 1 MG tablet Take 1 mg by mouth daily.   Yes [provider]  gabapentin (NEURONTIN) 600 MG tablet Take 600 mg by mouth 3 (three) times daily. 05/26/19  Yes [provider]  lovastatin (MEVACOR) 20 MG tablet Take 20 mg by mouth at bedtime.   Yes [provider]  Melatonin 5 MG TABS Take 1 tablet (5 mg total) by mouth at bedtime. 04/01/19  Yes Tyler Pita, MD  meloxicam (MOBIC) 15 MG tablet Take 15 mg by mouth daily. 01/01/20  Yes [provider]  metoprolol tartrate (LOPRESSOR) 25 MG tablet Take 1 tablet (25 mg total) by mouth 2 (two) times daily. Patient taking  differently: Take 25 mg by mouth daily.  04/01/19  Yes Tyler Pita, MD  pantoprazole (PROTONIX) 40 MG tablet Take 40 mg by mouth daily.    Yes [provider]  rOPINIRole (REQUIP) 0.5 MG tablet Take 0.5 mg by mouth at bedtime.  06/09/18  Yes [provider]  scopolamine (TRANSDERM-SCOP) 1 MG/3DAYS Place 1 patch (1.5 mg total) onto the skin every 3 (three) days. 01/24/20  Yes Fritzi Mandes, MD  sodium chloride 0.9 % nebulizer solution Take 3 mLs by nebulization as needed for wheezing.  07/17/19  Yes [provider]  feeding supplement (ENSURE ENLIVE / ENSURE PLUS) LIQD Take 237 mLs by mouth 3 (three) times daily between meals. 01/21/20   Fritzi Mandes, MD  Nebulizers (COMPRESSOR/NEBULIZER) MISC Nebulizer with supplies, trach collar for med admin. 04/01/19   Tyler Pita, MD     Critical care time: 25 minutes     Darel Hong, [facility] Pager: [phone]

## 2020-02-12 NOTE — Progress Notes (Signed)
Pt alert and Oriented x 4, remains calm and cooperative. Placed back on Mechanical Vent for the night, all vitals remained stable, NSR, O2 98% and above, afebrile, and resp 14-16. Glucerna 1.5 cal is still running continuously at a  Rate of 50 ml/hr, abd still distended, but pt denies any pain or abdominal discomfort. No tracheal suctioning was needed throughout the shift, and pt denies any need for suctioning.

## 2020-02-12 NOTE — TOC Progression Note (Addendum)
Transition of Care [facility]) - Progression Note    Patient Details  Name: Angela David MRN: [id number] Date of Birth: [birth year]  Transition [facility]) CM/SW [place], [place] Phone Number: 02/12/2020, 10:29 AM  Clinical Narrative:     LTACH denied placement, due to patient at baseline.  Recommended SNF placement. CSW reached out to Neoma Laming at [facility] who accepted patient for placement via [place]. CSW left message   Expected Discharge Plan: Skilled Nursing Facility Barriers to Discharge: Continued Medical Work up  Expected Discharge Plan and Services Expected Discharge Plan: [place] Choice: [place] arrangements for the past 2 months: Single Family Home                                       Social Determinants of Health (SDOH) Interventions    Readmission Risk Interventions No flowsheet data found.

## 2020-02-12 NOTE — Progress Notes (Signed)
Physical Therapy Treatment Patient Details Name: Angela David MRN: [id number] DOB: 1940-07-04 Today's Date: 02/12/2020    History of Present Illness Pt is a 79 y/o F her 11/19 with c/o hallucinations x 1 week. Imaging negative. Pt admitted for tx of acute encephalopathy of unclear etiology but suspect due to RLL PNA. (Pt also recently seen in ED on 01/11/20 s/p fall & found to have R clavicular fx & sent home with R shoulder immobilizer, instructed to f/u with ortho on OP basis.) PMH: R breast CA (2002), DM, peripheral neuropathy, glaucoma, high cholesterol, vocal cord paralysis. Rapid response called and pt transferred to CCU, on vent.    PT Comments    Pt was pleasant and motivated to participate during the session and made good progress towards goals. Pt required only min A with bed mobility tasks and responded well to anterior weight shifting activities to address tendency to lean posteriorly.  Pt was somewhat anxious regarding standing/ambulating but agreed with +2 assist for safety.  Pt again presented with posterior instability that improved with anterior weight shifting activities and was able to amb with min A using LUE HHA.  Pt's SpO2 and HR were WNL throughout the session. Pt will benefit from PT services in a SNF setting upon discharge to safely address deficits listed in patient problem list for decreased caregiver assistance and eventual return to PLOF.     Follow Up Recommendations  SNF;Supervision/Assistance - 24 hour     Equipment Recommendations  Other (comment) (TBD at next venue of care)    Recommendations for Other Services       Precautions / Restrictions Precautions Precautions: Fall Precaution Comments: vent Restrictions Weight Bearing Restrictions: Yes RUE Weight Bearing: Non weight bearing Other Position/Activity Restrictions: Pt with findings of R clavicle fx on 01/11/20, sent home with shoulder immobilizer & told to f/u with ortho as outpatient but  unable to do this since readmission to hospital. Therapy electing to maintain NWB, no ROM R shoulder    Mobility  Bed Mobility Overal bed mobility: Needs Assistance Bed Mobility: Supine to Sit;Sit to Supine     Supine to sit: Min assist;HOB elevated Sit to supine: Min assist   General bed mobility comments: Min A for BLE and trunk control  Transfers Overall transfer level: Needs assistance Equipment used: 1 person hand held assist Transfers: Sit to/from Stand Sit to Stand: Min assist;+2 safety/equipment         General transfer comment: Mod verbal and tactile cues for increased trunk flexion and for hand placement to avoid pushing with RUE, min A to come to full standing and to prevent posterior LOB upon initial stand  Ambulation/Gait Ambulation/Gait assistance: +2 safety/equipment;Min assist Gait Distance (Feet): 8 Feet x 1, 5 Feet x 1 Assistive device: 1 person hand held assist Gait Pattern/deviations: Step-to pattern Gait velocity: decreased   General Gait Details: Pt able to amb 1 x 8 feet and 1 x 5 feet at EOB with +2 per pt request secondary to fear of falling; min A to address posterior instability   Stairs             Wheelchair Mobility    Modified Rankin (Stroke Patients Only)       Balance Overall balance assessment: Needs assistance Sitting-balance support: No upper extremity supported;Feet supported Sitting balance-Leahy Scale: Poor Sitting balance - Comments: posterior lean that improved after anterior weight shifting activities Postural control: Posterior lean Standing balance support: During functional activity;Bilateral upper extremity supported Standing balance-Leahy  Scale: Poor Standing balance comment: posterior lean in standing with physical assist required to prevent LOB; improved grossly with anterior weight shifting                            Cognition Arousal/Alertness: Awake/alert Behavior During Therapy: WFL for tasks  assessed/performed Overall Cognitive Status: Within Functional Limits for tasks assessed                                        Exercises Total Joint Exercises Ankle Circles/Pumps: Strengthening;Both;5 reps;10 reps Quad Sets: Strengthening;Both;5 reps;10 reps Gluteal Sets: Strengthening;Both;10 reps;5 reps Other Exercises Other Exercises: Anterior weight shifting in sitting and standing to address posterior lean    General Comments        Pertinent Vitals/Pain Pain Assessment: No/denies pain    Home Living                      Prior Function            PT Goals (current goals can now be found in the care plan section) Progress towards PT goals: Progressing toward goals    Frequency    Min 2X/week      PT Plan Current plan remains appropriate    Co-evaluation              AM-PAC PT "6 Clicks" Mobility   Outcome Measure  Help needed turning from your back to your side while in a flat bed without using bedrails?: A Little Help needed moving from lying on your back to sitting on the side of a flat bed without using bedrails?: A Little Help needed moving to and from a bed to a chair (including a wheelchair)?: A Little Help needed standing up from a chair using your arms (e.g., wheelchair or bedside chair)?: A Little Help needed to walk in hospital room?: A Lot Help needed climbing 3-5 steps with a railing? : Total 6 Click Score: 15    End of Session Equipment Utilized During Treatment: Gait belt;Oxygen Activity Tolerance: Patient tolerated treatment well Patient left: in bed;with call bell/phone within reach;with nursing/sitter in room;with family/visitor present Nurse Communication: Mobility status;Weight bearing status PT Visit Diagnosis: Muscle weakness (generalized) (M62.81);History of falling (Z91.81);Unsteadiness on feet (R26.81);Difficulty in walking, not elsewhere classified (R26.2)     Time: 1607-3710 PT Time Calculation  (min) (ACUTE ONLY): 27 min  Charges:  $Gait Training: 8-22 mins $Therapeutic Exercise: 8-22 mins                     D. Scott Orah Sonnen PT, DPT 02/12/20, 4:52 PM

## 2020-02-13 ENCOUNTER — Ambulatory Visit (HOSPITAL_COMMUNITY)
Admission: AD | Admit: 2020-02-13 | Discharge: 2020-02-13 | Disposition: A | Discharge status: Home / Self Care | Payer: Medicare Other | Source: Other Acute Inpatient Hospital | Attending: Internal Medicine | Admitting: Internal Medicine

## 2020-02-13 DIAGNOSIS — I5032 Chronic diastolic (congestive) heart failure: Secondary | ICD-10-CM | POA: Insufficient documentation

## 2020-02-13 DIAGNOSIS — Z930 Tracheostomy status: Secondary | ICD-10-CM | POA: Insufficient documentation

## 2020-02-13 DIAGNOSIS — E1165 Type 2 diabetes mellitus with hyperglycemia: Secondary | ICD-10-CM | POA: Insufficient documentation

## 2020-02-13 DIAGNOSIS — J9620 Acute and chronic respiratory failure, unspecified whether with hypoxia or hypercapnia: Secondary | ICD-10-CM | POA: Insufficient documentation

## 2020-02-13 DIAGNOSIS — Z79899 Other long term (current) drug therapy: Secondary | ICD-10-CM | POA: Insufficient documentation

## 2020-02-13 DIAGNOSIS — Z792 Long term (current) use of antibiotics: Secondary | ICD-10-CM | POA: Insufficient documentation

## 2020-02-13 DIAGNOSIS — J9601 Acute respiratory failure with hypoxia: Secondary | ICD-10-CM

## 2020-02-13 DIAGNOSIS — J690 Pneumonitis due to inhalation of food and vomit: Secondary | ICD-10-CM | POA: Insufficient documentation

## 2020-02-13 DIAGNOSIS — I480 Paroxysmal atrial fibrillation: Secondary | ICD-10-CM | POA: Insufficient documentation

## 2020-02-13 LAB — BASIC METABOLIC PANEL
Anion gap: 9 (ref 5–15)
BUN: 25 mg/dL — ABNORMAL HIGH (ref 8–23)
CO2: 28 mmol/L (ref 22–32)
Calcium: 9 mg/dL (ref 8.9–10.3)
Chloride: 102 mmol/L (ref 98–111)
Creatinine, Ser: 0.86 mg/dL (ref 0.44–1.00)
GFR, Estimated: >60 mL/min (ref >60)
Glucose, Bld: 158 mg/dL — ABNORMAL HIGH (ref 70–99)
Potassium: 4.1 mmol/L (ref 3.5–5.1)
Sodium: 139 mmol/L (ref 135–145)

## 2020-02-13 LAB — CBC WITH DIFFERENTIAL/PLATELET
Abs Immature Granulocytes: 0.17 10*3/uL — ABNORMAL HIGH (ref 0.00–0.07)
Basophils Absolute: 0.1 10*3/uL (ref 0.0–0.1)
Basophils Relative: 1 %
Eosinophils Absolute: 0.4 10*3/uL (ref 0.0–0.5)
Eosinophils Relative: 3 %
HCT: 35.5 % — ABNORMAL LOW (ref 36.0–46.0)
Hemoglobin: 10.7 g/dL — ABNORMAL LOW (ref 12.0–15.0)
Immature Granulocytes: 2 %
Lymphocytes Relative: 17 %
Lymphs Abs: 1.9 10*3/uL (ref 0.7–4.0)
MCH: 25.4 pg — ABNORMAL LOW (ref 26.0–34.0)
MCHC: 30.1 g/dL (ref 30.0–36.0)
MCV: 84.3 fL (ref 80.0–100.0)
Monocytes Absolute: 0.5 10*3/uL (ref 0.1–1.0)
Monocytes Relative: 5 %
Neutro Abs: 8.3 10*3/uL — ABNORMAL HIGH (ref 1.7–7.7)
Neutrophils Relative %: 72 %
Platelets: 297 10*3/uL (ref 150–400)
RBC: 4.21 MIL/uL (ref 3.87–5.11)
RDW: 13.6 % (ref 11.5–15.5)
WBC: 11.3 10*3/uL — ABNORMAL HIGH (ref 4.0–10.5)
nRBC: 0 % (ref 0.0–0.2)

## 2020-02-13 LAB — GLUCOSE, CAPILLARY
Glucose-Capillary: 166 mg/dL — ABNORMAL HIGH (ref 70–99)
Glucose-Capillary: 154 mg/dL — ABNORMAL HIGH (ref 70–99)
Glucose-Capillary: 149 mg/dL — ABNORMAL HIGH (ref 70–99)

## 2020-02-13 LAB — PHOSPHORUS: Phosphorus: 3.8 mg/dL (ref 2.5–4.6)

## 2020-02-13 LAB — MAGNESIUM: Magnesium: 2.4 mg/dL (ref 1.7–2.4)

## 2020-02-13 MED ORDER — FOLIC ACID 1 MG PO TABS: 1.0000 mg | ORAL_TABLET | Freq: Every day | ORAL | Status: DC

## 2020-02-13 MED ORDER — MELATONIN 5 MG PO TABS: 5.0000 mg | ORAL_TABLET | Freq: Every day | ORAL | 0 refills | Status: DC

## 2020-02-13 MED ORDER — PANTOPRAZOLE SODIUM 40 MG PO PACK: 40.0000 mg | PACK | Freq: Every day | ORAL | Status: DC

## 2020-02-13 MED ORDER — GABAPENTIN 250 MG/5ML PO SOLN: 600.0000 mg | Freq: Three times a day (TID) | ORAL | 12 refills | Status: DC

## 2020-02-13 MED ORDER — PRAVASTATIN SODIUM 20 MG PO TABS: 20.0000 mg | ORAL_TABLET | Freq: Every day | ORAL | Status: DC

## 2020-02-13 MED ORDER — ROPINIROLE HCL 0.5 MG PO TABS: 0.5000 mg | ORAL_TABLET | Freq: Every day | ORAL | Status: DC

## 2020-02-13 MED ORDER — BUSPIRONE HCL 30 MG PO TABS: 30.0000 mg | ORAL_TABLET | Freq: Two times a day (BID) | ORAL | Status: DC

## 2020-02-13 MED ORDER — ALBUTEROL SULFATE (2.5 MG/3ML) 0.083% IN NEBU: 2.5000 mg | INHALATION_SOLUTION | RESPIRATORY_TRACT | 12 refills | Status: DC | PRN

## 2020-02-13 MED ORDER — ENOXAPARIN SODIUM 40 MG/0.4ML ~~LOC~~ SOLN: 40.0000 mg | SUBCUTANEOUS | Status: DC

## 2020-02-13 MED ORDER — ESCITALOPRAM OXALATE 10 MG PO TABS: 30.0000 mg | ORAL_TABLET | Freq: Every day | ORAL | Status: DC

## 2020-02-13 NOTE — TOC Transition Note (Signed)
Transition of Care [facility]) - CM/SW Discharge Note   Patient Details  Name: Angela David MRN: [id number] Date of Birth: 1941/02/10  [facility]) CM/SW Contact:  Meriel Flavors, LCSW Phone Number: 02/13/2020, 1:13 PM   Clinical Narrative:    CSW received discharge info via text from [place]. Nurse Russell set up transporting. CSW was not contacted.     Barriers to Discharge: Continued Medical Work up   Patient Goals and CMS Choice        Discharge Placement                       Discharge Plan and Services     Post Acute Care Choice: Skilled Nursing Facility                               Social Determinants of Health (SDOH) Interventions     Readmission Risk Interventions No flowsheet data found.

## 2020-02-13 NOTE — Progress Notes (Signed)
Report given to RN at [facility] and [facility] called for pickup.

## 2020-02-13 NOTE — Discharge Summary (Signed)
Physician Discharge Summary  Patient ID: Angela David MRN: [id number] DOB/AGE: 1940/12/24 79 y.o.  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 Noncompliant with home nocturnal vent for months, status post gastrostomy tube.  History of present illness   66 yofemale presentedto the emergency department from [facility] of family concerns regarding worsening altered mental status.Upon arrival to the ED patient was unable to give any history due to lethargy.She hada recent admission at [facility] for altered mental status and suspected aspiration pneumonia.The patient was discharged 2 days ago on 01/21/2020 and family reported via ED documentation that the patient has undergone a drastic mental status change.ED work-up included aCT headwhich was negative for any acute abnormality.ABG revealed respiratory acidosis with hypoxia and hypercarbia and lab work showed leukocytosis. Initial lactic acid was unimpressive at 1.0,repeat 1.9,cultures and procalcitonin are pending.Chest x-ray shows no significant interval change, withhazy lung markings and unchanged potentialopacityvssmall left lower lobe pleural effusion.  PCCM team consulted for further management and monitoring with admission  to the ICU  Patient needs LTACH  currently needs a aggressive PT [name] Vent support at night with  She no longer can eat by mouth-high risk for aspiration   Past Medical History  Tracheal stenosis s/p tracheostomy Breast Cancer - 2002 T2DM HLD Glaucoma GERD HTN  Significant Hospital Events   11/27: Admit to ICU 12/3: Gastrostomy tube placed by General Surgery 12/15: Tolerating trach collar trials during day and home vent at night; initial plan to d/c home with home health; but PT feels pt needs SNF and rehab 12/17: Tolerating trach collar trials, discharge disposition still in progress  Consults:  PCCM General Surgery  Procedures:  01/29/20: Gastrostomy tube placed by General Surgery  Significant Diagnostic Tests:  01/23/20 CT head w/o contrast >> no acute abnormality 01/23/20: CXR>>The tracheostomy tube is stable in positioning. The heart size is unchanged. Hazy coarse lung markings are again noted. Areas of fibrosis and scarring are noted at the lung apices. There is elevation of the right hemidiaphragm. Aortic calcifications are Noted. 01/24/20: Abdominal X-ray>>The OG tube projects over the gastric body. The bowel gas pattern is nonobstructive. 12/6: CXR>>1. Stable tracheostomy tube. 2. Chronic interstitial changes and streaky areas of atelectasis. No new infiltrates or edema.  Micro Data:  01/23/20 COVID - 19 >> negative 01/23/20 Influenza A/B >> negative 01/23/20 Blood cultures x 2 >>negative 01/23/20 Urine culture >>no growth 01/23/20 Tracheal aspirate >>Normal respiratory Flora  Antimicrobials:  01/23/20 Cefepime >>11/28 01/23/20 Vancomycin >>11/28   Treatments:VENT SUPPORT VENT SETTING AT NIGHT PRVC RATE 15 TV 390 PEEP 5 fio2 28%  Discharge Exam: Blood pressure 118/67, pulse (!) 102, temperature 98.5 F (36.9 C), temperature source Oral, resp. rate 16, height 5' 4.02" (1.626 m), weight 70.9 kg, SpO2 100 %.  Physical Examination:    General Appearance:   S/p trach  Neuro:without focal findings,  speech normal,  HEENT: PERRLA, EOM  intact.   Pulmonary: normal breath sounds, No wheezing.  CardiovascularNormal S1,S2.  No m/r/g.   Abdomen: Benign, Soft, non-tender. Renal:  No costovertebral tenderness  GU:  Not performed at this time. Endoc: No evident thyromegaly Skin:   warm, no rashes, no ecchymosis  Extremities: normal, no cyanosis, clubbing. PSYCHIATRIC: Mood, affect within normal limits.   ALL OTHER ROS ARE NEGATIVE   Disposition:  There are no questions and answers to display.         Allergies as of 02/13/2020      Reactions   Shellfish Allergy Shortness Of Breath   respitatory distress    Sodium Sulfite Shortness Of Breath   Severe Congestion that creates inability to breath   Sulfa Antibiotics Other (See Comments), Shortness Of Breath   Other reaction(s): OTHER Other reaction(s): Other (See Comments) respitatory distress  Other reaction(s): OTHER respitatory distress  Other reaction(s): OTHER Other reaction(s): Other (See Comments) respitatory distress  respitatory distress    Codeine Other (See Comments)   Per patient "it kept her up all night"      Medication List    STOP taking these medications   acetaminophen 325 MG tablet Commonly known as: TYLENOL   allopurinol 100 MG tablet Commonly known as: ZYLOPRIM   amLODipine 10 MG tablet Commonly known as: NORVASC   amoxicillin-clavulanate 875-125 MG tablet Commonly known as: AUGMENTIN   Calcium Carbonate-Vitamin D 600-400 MG-UNIT tablet   Compressor/Nebulizer Misc   feeding supplement Liqd   fluconazole 150 MG tablet Commonly known as: DIFLUCAN   gabapentin 600 MG tablet Commonly known as: NEURONTIN Replaced by: gabapentin 250 MG/5ML solution   lovastatin 20 MG tablet Commonly known as: MEVACOR   meloxicam 15 MG tablet Commonly known as: MOBIC   metoprolol tartrate 25 MG tablet Commonly known as: LOPRESSOR    pantoprazole 40 MG tablet Commonly known as: PROTONIX Replaced by: pantoprazole sodium 40 mg/20 mL Pack   scopolamine 1 MG/3DAYS Commonly known as: TRANSDERM-SCOP   sodium chloride 0.9 % nebulizer solution     TAKE these medications   albuterol (2.5 MG/3ML) 0.083% nebulizer solution Commonly known as: PROVENTIL Take 3 mLs (2.5 mg total) by nebulization every 4 (four) hours as needed for wheezing or shortness of breath.   busPIRone 30 MG tablet Commonly known as: BUSPAR 1 tablet (30 mg total) by Per NG tube route 2 (two) times daily. What changed:   medication strength  how to take this   enoxaparin 40 MG/0.4ML injection Commonly known as: LOVENOX Inject 0.4 mLs (40 mg total) into the skin daily.   escitalopram 10 MG tablet Commonly known as: LEXAPRO Place 3 tablets (30 mg total) into feeding tube daily. What changed:   medication strength  how much to take  how to take this  additional instructions  Another medication with the same name was removed. Continue taking this medication, and follow the directions you see here.   folic acid 1 MG tablet Commonly known as: FOLVITE Place 1 tablet (1 mg total) into feeding tube daily. What changed: how to take this   gabapentin 250 MG/5ML solution Commonly known as: NEURONTIN Place 12 mLs (600 mg total) into feeding tube every 8 (eight) hours. Replaces: gabapentin 600 MG tablet   melatonin 5 MG Tabs Place 1 tablet (5 mg total) into feeding tube at bedtime. What changed: how to take this   pantoprazole sodium 40 mg/20 mL Pack Commonly known as: PROTONIX Place 20 mLs (40 mg total) into feeding tube at bedtime.  Replaces: pantoprazole 40 MG tablet   pravastatin 20 MG tablet Commonly known as: PRAVACHOL 1 tablet (20 mg total) by Per NG tube route daily at 6 PM.   rOPINIRole 0.5 MG tablet Commonly known as: REQUIP Place 1 tablet (0.5 mg total) into feeding tube at bedtime. What changed: how to take this        DISPOSITION LTACH KINDRED   Signed: Flora Lipps 02/13/2020, 9:13 AM

## 2020-02-13 NOTE — Progress Notes (Signed)
Lucas visited pt. as follow-up from visits earlier this admission; pt. awake, sitting up in bed, with husband [name] sitting at bedside.  Hines spoke w/pt. and [name] about discharge plan; both are amenable to plan of going to SNF in [place], though pt. verbalized a desire to stay at [facility] through Christmas out of desire to be closer to [name].  Pt. and [name] said home vent trial earlier this week went well; Wille Glaser says he was trained to manage vent and tube feeding, but pt. will continue administering her own diabetes meds --> [name] says 'I'm not too good at doing shots' recalling past times when he has had to give his dog shots and this has not gone well.  [name] brought bills for pt. to pay/ checks to sign.  Both pt. and [name] appear in good spirits, though pt. shares she has been feeling very tired and wishes she could eat solid food.  [place] prayed for pt. and contacted CSW for conversation w/[name] at pt.'s request.  CH remains available as needed.

## 2020-02-15 DIAGNOSIS — J9621 Acute and chronic respiratory failure with hypoxia: Secondary | ICD-10-CM

## 2020-02-15 DIAGNOSIS — I5032 Chronic diastolic (congestive) heart failure: Secondary | ICD-10-CM

## 2020-02-15 DIAGNOSIS — I480 Paroxysmal atrial fibrillation: Secondary | ICD-10-CM

## 2020-02-15 DIAGNOSIS — J386 Stenosis of larynx: Secondary | ICD-10-CM

## 2020-02-16 DIAGNOSIS — I480 Paroxysmal atrial fibrillation: Secondary | ICD-10-CM

## 2020-02-16 DIAGNOSIS — I5032 Chronic diastolic (congestive) heart failure: Secondary | ICD-10-CM

## 2020-02-16 DIAGNOSIS — J9621 Acute and chronic respiratory failure with hypoxia: Secondary | ICD-10-CM

## 2020-02-16 DIAGNOSIS — J386 Stenosis of larynx: Secondary | ICD-10-CM

## 2020-02-17 DIAGNOSIS — I480 Paroxysmal atrial fibrillation: Secondary | ICD-10-CM

## 2020-02-17 DIAGNOSIS — J386 Stenosis of larynx: Secondary | ICD-10-CM

## 2020-02-17 DIAGNOSIS — I5032 Chronic diastolic (congestive) heart failure: Secondary | ICD-10-CM

## 2020-02-17 DIAGNOSIS — J9621 Acute and chronic respiratory failure with hypoxia: Secondary | ICD-10-CM

## 2020-02-18 DIAGNOSIS — J9621 Acute and chronic respiratory failure with hypoxia: Secondary | ICD-10-CM

## 2020-02-18 DIAGNOSIS — I480 Paroxysmal atrial fibrillation: Secondary | ICD-10-CM

## 2020-02-18 DIAGNOSIS — J386 Stenosis of larynx: Secondary | ICD-10-CM

## 2020-02-18 DIAGNOSIS — I5032 Chronic diastolic (congestive) heart failure: Secondary | ICD-10-CM

## 2020-02-19 DIAGNOSIS — J386 Stenosis of larynx: Secondary | ICD-10-CM

## 2020-02-19 DIAGNOSIS — J9621 Acute and chronic respiratory failure with hypoxia: Secondary | ICD-10-CM

## 2020-02-19 DIAGNOSIS — I480 Paroxysmal atrial fibrillation: Secondary | ICD-10-CM

## 2020-02-19 DIAGNOSIS — I5032 Chronic diastolic (congestive) heart failure: Secondary | ICD-10-CM

## 2020-02-20 DIAGNOSIS — J386 Stenosis of larynx: Secondary | ICD-10-CM

## 2020-02-20 DIAGNOSIS — I480 Paroxysmal atrial fibrillation: Secondary | ICD-10-CM

## 2020-02-20 DIAGNOSIS — I5032 Chronic diastolic (congestive) heart failure: Secondary | ICD-10-CM

## 2020-02-20 DIAGNOSIS — J9621 Acute and chronic respiratory failure with hypoxia: Secondary | ICD-10-CM

## 2020-02-21 DIAGNOSIS — I5032 Chronic diastolic (congestive) heart failure: Secondary | ICD-10-CM

## 2020-02-21 DIAGNOSIS — I480 Paroxysmal atrial fibrillation: Secondary | ICD-10-CM

## 2020-02-21 DIAGNOSIS — J386 Stenosis of larynx: Secondary | ICD-10-CM

## 2020-02-21 DIAGNOSIS — J9621 Acute and chronic respiratory failure with hypoxia: Secondary | ICD-10-CM

## 2020-02-22 IMAGING — MG DIGITAL SCREENING BILATERAL MAMMOGRAM WITH TOMO AND CAD
6 of 12 series · 6 of 36 positions shown · non-contrast
Comparison: Previous exam(s).

CLINICAL DATA: Screening.

EXAM:
DIGITAL SCREENING BILATERAL MAMMOGRAM WITH TOMO AND CAD

[L MLO synth-2D (1 of 2)]
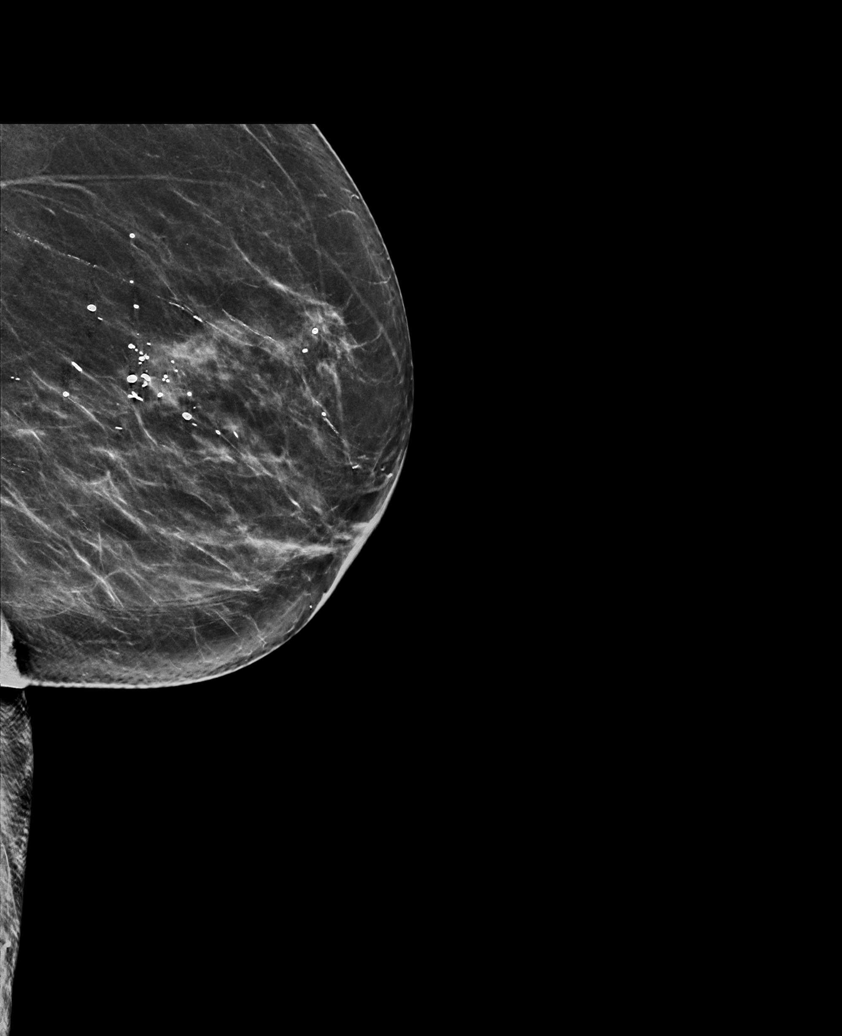

[L MLO synth-2D (2 of 2)]
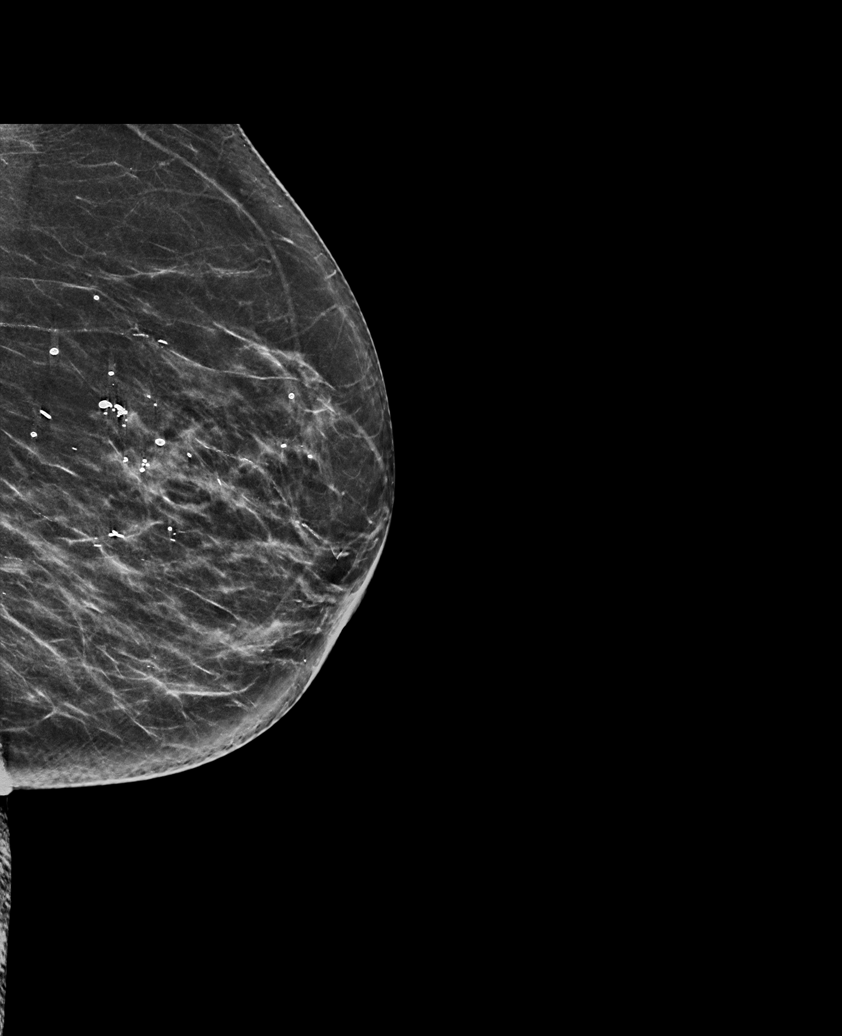

[R CC synth-2D (1 of 2)]
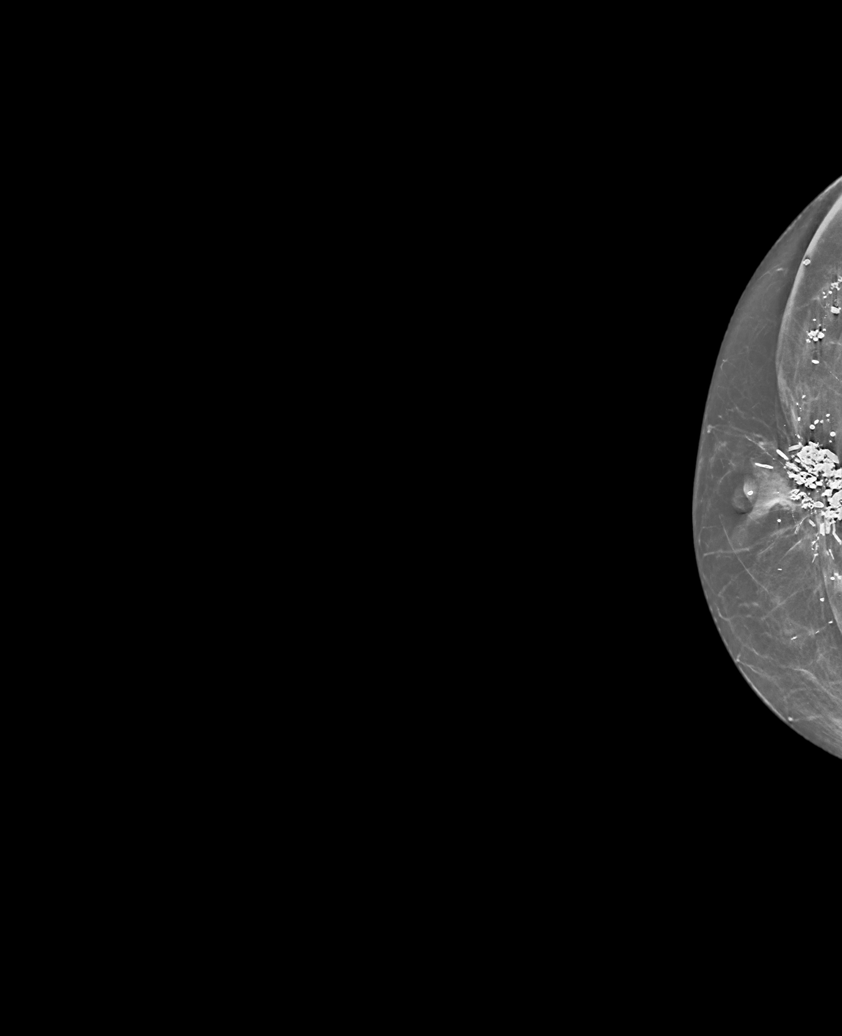

[R CC synth-2D (2 of 2)]
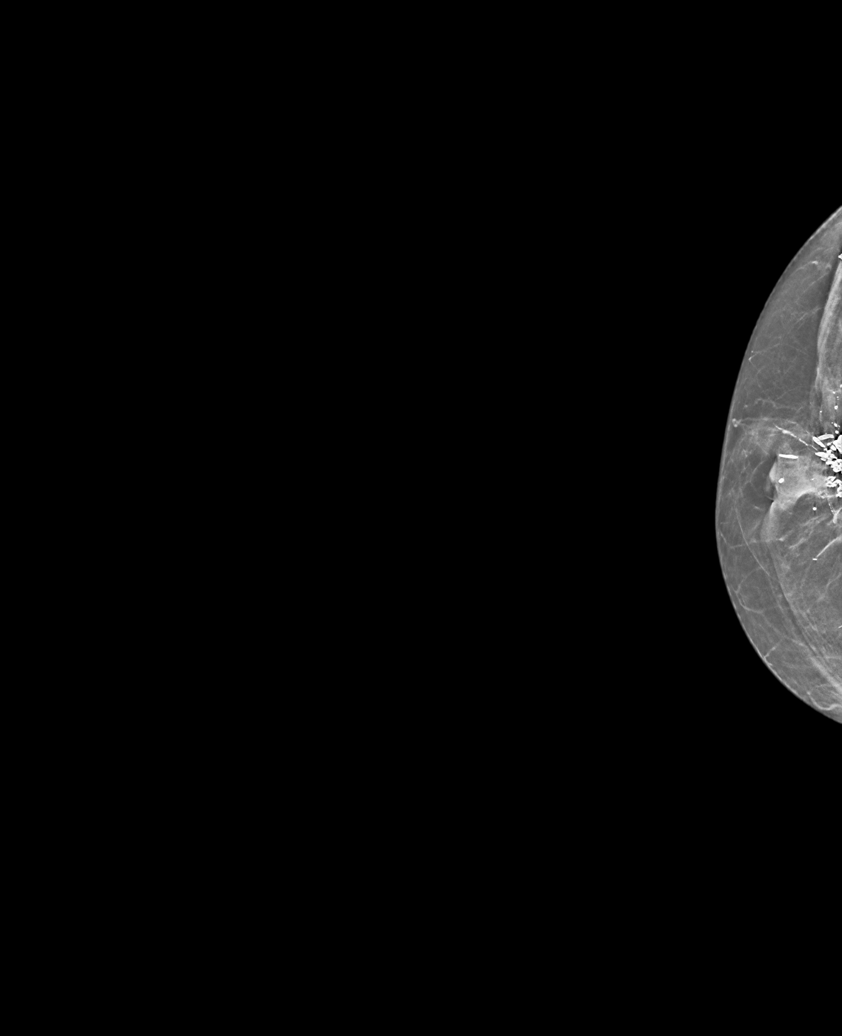

[R MLO synth-2D]
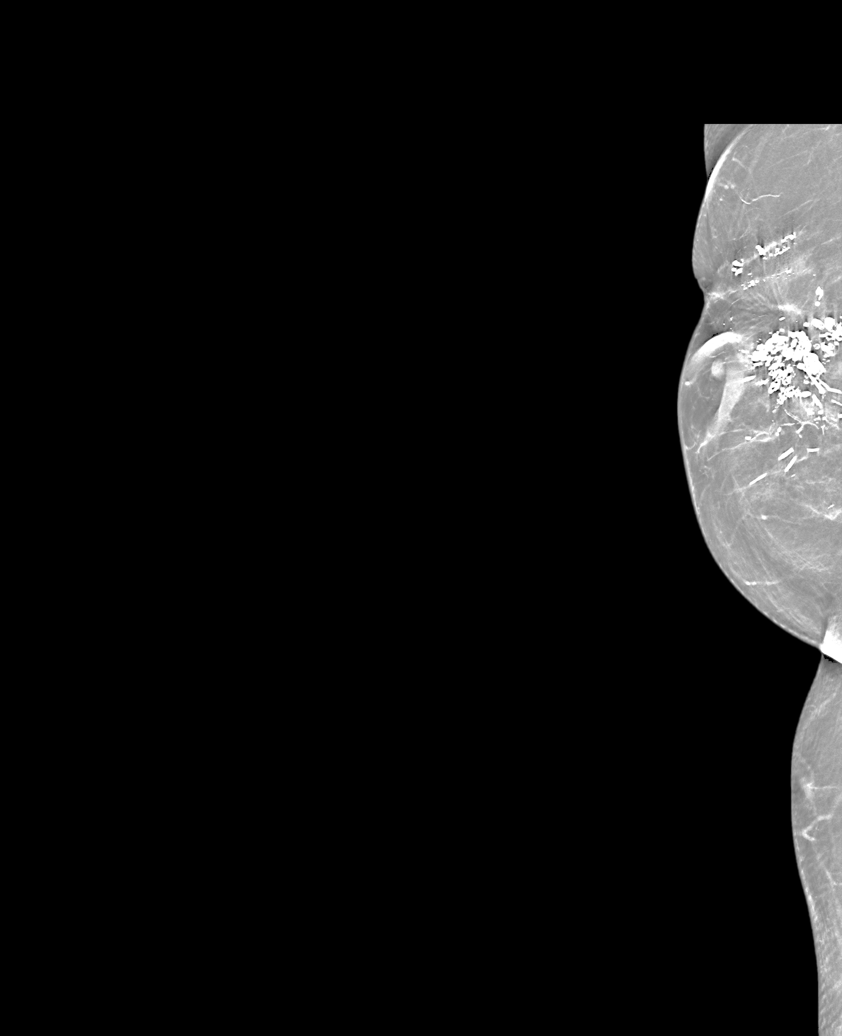

[L CC synth-2D]
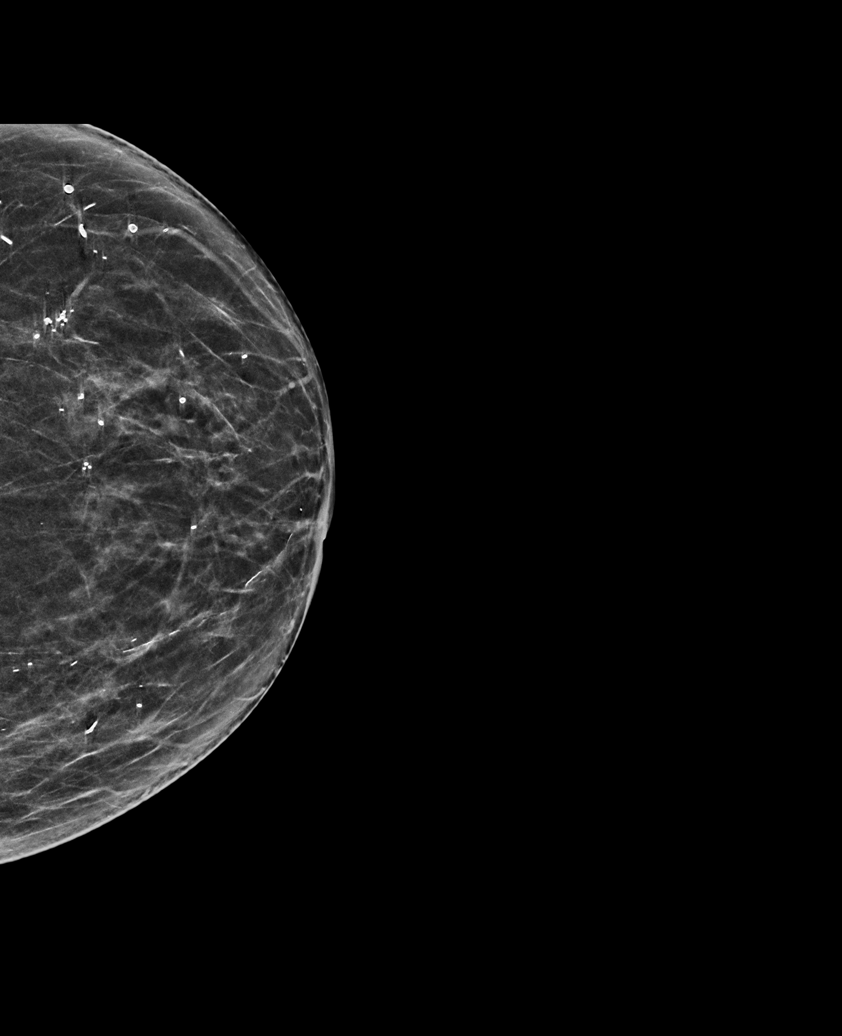

[6 of 36 positions shown; findings below may reference images not displayed]

ACR Breast Density Category b: There are scattered areas of
fibroglandular density.
FINDINGS: There are no findings suspicious for malignancy. Images were
processed with CAD.
IMPRESSION: No mammographic evidence of malignancy. A result letter of this
screening mammogram will be mailed directly to the patient.

RECOMMENDATION:
Screening mammogram in one year. (Code:[id number])

BI-RADS CATEGORY  1: Negative.

## 2020-03-18 ENCOUNTER — Institutional Professional Consult (permissible substitution): Payer: Medicare Other | Admitting: Neurology

## 2020-03-29 ENCOUNTER — Ambulatory Visit (INDEPENDENT_AMBULATORY_CARE_PROVIDER_SITE_OTHER): Payer: Medicare Other | Admitting: Pulmonary Disease

## 2020-03-29 ENCOUNTER — Encounter: Payer: Self-pay | Admitting: Pulmonary Disease

## 2020-03-29 ENCOUNTER — Other Ambulatory Visit: Payer: Self-pay

## 2020-03-29 VITALS — BP 112/62 | HR 82 | Temp 97.3°F | Ht 64.0 in | Wt 150.0 lb

## 2020-03-29 DIAGNOSIS — Z930 Tracheostomy status: Secondary | ICD-10-CM

## 2020-03-29 DIAGNOSIS — J9611 Chronic respiratory failure with hypoxia: Secondary | ICD-10-CM

## 2020-03-29 NOTE — Progress Notes (Signed)
@Patient  ID: Angela David, female    DOB: 1941-01-03, 80 y.o.   MRN: [id number]  Chief Complaint  Patient presents with  . Follow-up    Recent admission 01/23/2020- no current sx.     Referring provider: Jodi Marble, MD  HPI:  80 year old female never smoker [facility] for acute hypoxemic respiratory failure  PMH: Per history of breast cancer, anxiety, history of colonic polyps, type 2 diabetes, diastolic CHF, depression, diabetic neuropathy, Smoker/ Smoking History: Never smoked Maintenance: None Pt of: DK  03/29/2020  - Visit   80 year old female never smoker [facility] for chronic hypoxemic respiratory failure.  She is established with [facility].  Last seen in our office in March/2021.  This is a hospital follow-up.  Patient with trach.  Also established with the ENT Dr. Tami Ribas.  Utilizing trilogy home ventilator.  Patient presenting today as a hospital follow-up from November/2021 hospitalization she was hospitalized for 21 days.  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 Noncompliant with home nocturnal vent for months, status post gastrostomy tube.  History of present illness   72 yofemale presentedto the emergency  department from [facility] of family concerns regarding worsening altered mental status.Upon arrival to the ED patient was unable to give any history due to lethargy.She hada recent admission at [facility] for altered mental status and suspected aspiration pneumonia.The patient was discharged 2 days ago on 01/21/2020 and family reported via ED documentation that the patient has undergone a drastic mental status change.ED work-up included aCT headwhich was negative for any acute abnormality.ABG revealed respiratory acidosis with hypoxia and hypercarbia and lab work showed leukocytosis. Initial lactic acid was unimpressive at 1.0,repeat 1.9,cultures and procalcitonin are pending.Chest x-ray shows no significant interval change, withhazy lung markings and unchanged potentialopacityvssmall left lower lobe pleural effusion.  PCCM team consulted for further management and monitoring with admission to the ICU  Patient needs LTACH  currently needs a aggressive PT [name] Vent support at night with  She no longer can eat by mouth-high risk for aspiration   Past Medical History  Tracheal stenosis s/p tracheostomy Breast Cancer - 2002 T2DM HLD Glaucoma GERD HTN  Significant Hospital Events   11/27: Admit to ICU 12/3: Gastrostomy tube placed by General Surgery 12/15: Tolerating trach collar trials during day and home vent at night; initial plan to d/c home with home health; but PT feels pt needs SNF and rehab 12/17: Tolerating trach collar trials, discharge disposition still in progress  Consults:  PCCM General Surgery  Procedures:  01/29/20: Gastrostomy tube placed by General Surgery  Significant Diagnostic Tests:  01/23/20 CT head w/o contrast >>no acute abnormality 01/23/20: CXR>>The tracheostomy tube is stable in positioning. The heart size is unchanged. Hazy coarse lung markings are again noted. Areas of fibrosis and scarring are noted at the lung apices. There  is elevation of  the right hemidiaphragm. Aortic calcifications are Noted. 01/24/20: Abdominal X-ray>>The OG tube projects over the gastric body. The bowel gas pattern is nonobstructive. 12/6: CXR>>1. Stable tracheostomy tube. 2. Chronic interstitial changes and streaky areas of atelectasis. No new infiltrates or edema.  Micro Data:  01/23/20 COVID - 19 >> negative 01/23/20 Influenza A/B >> negative 01/23/20 Blood cultures x 2 >>negative 01/23/20 Urine culture >>no growth 01/23/20 Tracheal aspirate >>Normal respiratory Flora  Antimicrobials:  01/23/20 Cefepime >>11/28 01/23/20 Vancomycin >>11/28   Treatments:VENT SUPPORT VENT SETTING AT NIGHT PRVC RATE 15 TV 390 PEEP 5 fio2 28%  Patient presenting for follow-up today overall she reports she is doing well.  She remains established with ENT.  She reports that she just saw Dr. Tami Ribas last week.  Overall she reports she is doing well.  She reports compliance to nocturnal ventilator.  We do not have a compliance report we will request this today.  Patient with no acute respiratory concerns today.  Patient denies any fevers.  Questionaires / Pulmonary Flowsheets:   ACT:  No flowsheet data found.  MMRC: No flowsheet data found.  Epworth:  No flowsheet data found.  Tests:   FENO:  No results found for: NITRICOXIDE  PFT: No flowsheet data found.  WALK:  No flowsheet data found.  Imaging: No results found.  Lab Results:  CBC    Component Value Date/Time   WBC 11.3 (H) 02/13/2020 0402   RBC 4.21 02/13/2020 0402   HGB 10.7 (L) 02/13/2020 0402   HGB 14.4 12/21/2015 1040   HCT 35.5 (L) 02/13/2020 0402   HCT 42.4 12/21/2015 1040   PLT 297 02/13/2020 0402   PLT 241 12/21/2015 1040   MCV 84.3 02/13/2020 0402   MCV 87 12/21/2015 1040   MCV 82 12/17/2011 0650   MCH 25.4 (L) 02/13/2020 0402   MCHC 30.1 02/13/2020 0402   RDW 13.6 02/13/2020 0402   RDW 12.5 12/21/2015 1040   RDW 13.3 12/17/2011 0650    LYMPHSABS 1.9 02/13/2020 0402   LYMPHSABS 1.1 12/21/2015 1040   LYMPHSABS 0.9 (L) 12/17/2011 0650   MONOABS 0.5 02/13/2020 0402   MONOABS 0.1 (L) 12/17/2011 0650   EOSABS 0.4 02/13/2020 0402   EOSABS 0.1 12/21/2015 1040   EOSABS 0.0 12/17/2011 0650   BASOSABS 0.1 02/13/2020 0402   BASOSABS 0.0 12/21/2015 1040   BASOSABS 0.0 12/17/2011 0650    BMET    Component Value Date/Time   NA 139 02/13/2020 0402   NA 143 08/28/2016 1132   NA 139 12/17/2011 0650   K 4.1 02/13/2020 0402   K 4.1 12/17/2011 0650   CL 102 02/13/2020 0402   CL 106 12/17/2011 0650   CO2 28 02/13/2020 0402   CO2 24 12/17/2011 0650   GLUCOSE 158 (H) 02/13/2020 0402   GLUCOSE 209 (H) 12/17/2011 0650   BUN 25 (H) 02/13/2020 0402   BUN 16 08/28/2016 1132   BUN 15 12/17/2011 0650   CREATININE 0.86 02/13/2020 0402   CREATININE 0.88 12/17/2011 0650   CALCIUM 9.0 02/13/2020 0402   CALCIUM 8.8 12/17/2011 0650   GFRNONAA >60 02/13/2020 0402   GFRNONAA >60 12/17/2011 0650   GFRAA 56 (L) 05/18/2019 1325   GFRAA >60 12/17/2011 0650    BNP    Component Value Date/Time   BNP 181.2 (H) 01/15/2020 1355    ProBNP No results found for: PROBNP  Specialty Problems      Pulmonary Problems   Blood in sputum   Benign neoplasm of trachea  Calcification of bronchus or trachea   Laryngeal spasm   Tracheostomy present (HCC)   Acute respiratory failure with hypoxia and hypercapnia (HCC)   Hypoxia   Left lower lobe pneumonia   Acute respiratory failure (HCC)   Aspiration pneumonia of both lower lobes due to gastric secretions (HCC)   Chronic respiratory failure with hypoxia (HCC)      Allergies  Allergen Reactions  . Shellfish Allergy Shortness Of Breath    respitatory distress   . Sodium Sulfite Shortness Of Breath    Severe Congestion that creates inability to breath  . Sulfa Antibiotics Other (See Comments) and Shortness Of Breath    Other reaction(s): OTHER Other reaction(s): Other (See  Comments) respitatory distress  Other reaction(s): OTHER respitatory distress  Other reaction(s): OTHER Other reaction(s): Other (See Comments) respitatory distress  respitatory distress   . Codeine Other (See Comments)    Per patient "it kept her up all night"    Immunization History  Administered Date(s) Administered  . PFIZER(Purple Top)SARS-COV-2 Vaccination 07/22/2019, 08/12/2019  . Pneumococcal Polysaccharide-23 01/21/2020    Past Medical History:  Diagnosis Date  . Breast cancer ([place]) 2002   right breast chemo and radiation  . Cancer [facility]) 2002   right breast  . Diabetes mellitus without complication ([place])   . Diabetic peripheral neuropathy ([place]) 08/28/2016  . GERD (gastroesophageal reflux disease)   . Glaucoma   . High cholesterol   . History of colon polyps   . Peripheral neuropathy 08/28/2016  . Personal history of chemotherapy 2002   BREAST CA  . Personal history of malignant neoplasm of breast   . Personal history of radiation therapy 2002   BREAST CA  . Vocal cord paralysis 2012    Tobacco History: Social History   Tobacco Use  Smoking Status Never Smoker  Smokeless Tobacco Never Used   Counseling given: Not Answered   Continue to not smoke  Outpatient Encounter Medications as of 03/29/2020  Medication Sig  . albuterol (PROVENTIL) (2.5 MG/3ML) 0.083% nebulizer solution Take 3 mLs (2.5 mg total) by nebulization every 4 (four) hours as needed for wheezing or shortness of breath.  . busPIRone (BUSPAR) 30 MG tablet 1 tablet (30 mg total) by Per NG tube route 2 (two) times daily.  Marland Kitchen escitalopram (LEXAPRO) 10 MG tablet Place 3 tablets (30 mg total) into feeding tube daily.  . folic acid (FOLVITE) 1 MG tablet Place 1 tablet (1 mg total) into feeding tube daily.  Marland Kitchen gabapentin (NEURONTIN) 250 MG/5ML solution Place 12 mLs (600 mg total) into feeding tube every 8 (eight) hours.  . melatonin 5 MG TABS Place 1 tablet (5 mg total) into feeding tube at bedtime.  .  pantoprazole sodium (PROTONIX) 40 mg/20 mL PACK Place 20 mLs (40 mg total) into feeding tube at bedtime.  . pravastatin (PRAVACHOL) 20 MG tablet 1 tablet (20 mg total) by Per NG tube route daily at 6 PM.  . rOPINIRole (REQUIP) 0.5 MG tablet Place 1 tablet (0.5 mg total) into feeding tube at bedtime.  . enoxaparin (LOVENOX) 40 MG/0.4ML injection Inject 0.4 mLs (40 mg total) into the skin daily. (Patient not taking: Reported on 03/29/2020)   No facility-administered encounter medications on file as of 03/29/2020.     Review of Systems  Review of Systems  Constitutional: Negative for activity change, fatigue and fever.  HENT: Positive for congestion. Negative for sinus pressure, sinus pain and sore throat.   Respiratory: Negative for cough, shortness of breath and wheezing.  Cardiovascular: Negative for chest pain and palpitations.  Gastrointestinal: Negative for diarrhea, nausea and vomiting.  Musculoskeletal: Negative for arthralgias.  Neurological: Negative for dizziness.  Psychiatric/Behavioral: Negative for sleep disturbance. The patient is not nervous/anxious.      Physical Exam  BP 112/62 (BP Location: Left Arm, Cuff Size: Normal)   Pulse 82   Temp (!) 97.3 F (36.3 C) (Temporal)   Ht 5\' 4"  (1.626 m)   Wt 150 lb (68 kg) Comment: weight is per pt  SpO2 100%   BMI 25.75 kg/m   Wt Readings from Last 5 Encounters:  03/29/20 150 lb (68 kg)  02/13/20 156 lb 4.9 oz (70.9 kg)  01/17/20 158 lb 11.7 oz (72 kg)  01/07/20 155 lb (70.3 kg)  12/08/19 160 lb 9.7 oz (72.8 kg)    BMI Readings from Last 5 Encounters:  03/29/20 25.75 kg/m  02/13/20 26.82 kg/m  01/17/20 27.25 kg/m  01/07/20 26.61 kg/m  12/08/19 27.57 kg/m     Physical Exam Vitals and nursing note reviewed.  Constitutional:      General: She is not in acute distress.    Appearance: Normal appearance.  HENT:     Head: Normocephalic and atraumatic.     Right Ear: External ear normal.     Left Ear: External  ear normal.     Mouth/Throat:     Mouth: Mucous membranes are moist.     Pharynx: Oropharynx is clear.  Eyes:     Pupils: Pupils are equal, round, and reactive to light.  Neck:     Trachea: Tracheostomy present. No abnormal tracheal secretions or tracheal deviation.   Cardiovascular:     Rate and Rhythm: Normal rate and regular rhythm.     Pulses: Normal pulses.     Heart sounds: Normal heart sounds. No murmur heard.   Pulmonary:     Effort: Pulmonary effort is normal. No respiratory distress.     Breath sounds: Normal breath sounds. No decreased air movement. No decreased breath sounds, wheezing or rales.  Abdominal:     General: Abdomen is flat. Bowel sounds are normal.     Palpations: Abdomen is soft.     Comments: g tube  Musculoskeletal:     Cervical back: Normal range of motion.  Skin:    General: Skin is warm and dry.     Capillary Refill: Capillary refill takes less than 2 seconds.  Neurological:     General: No focal deficit present.     Mental Status: She is alert and oriented to person, place, and time. Mental status is at baseline.     Gait: Gait normal.  Psychiatric:        Mood and Affect: Mood normal.        Behavior: Behavior normal.        Thought Content: Thought content normal.        Judgment: Judgment normal.       Assessment & Plan:   Chronic respiratory failure with hypoxia (HCC) Plan: Continue nocturnal trilogy ventilator We will request download from adapt DME-community message sent  Tracheostomy present [facility]) Plan: Continue follow-up with ENT Continue trach care    Return in about 2 months (around 05/27/2020), or if symptoms worsen or fail to improve, for [facility] - Dr. Mortimer Fries.   Lauraine Rinne, NP 03/29/2020   This appointment required 34 minutes of patient care (this includes precharting, chart review, review of results, face-to-face care, etc.).

## 2020-03-29 NOTE — Assessment & Plan Note (Signed)
Plan: Continue follow-up with ENT Continue trach care

## 2020-03-29 NOTE — Patient Instructions (Addendum)
You were seen today by Lauraine Rinne, NP  for:   Is a pleasure seeing you today.  I am glad you are doing well continue using your nighttime trilogy ventilator.  Let us know if you notice any acute changes with your breathing.  Continue follow-up with ENT  We will plan on seeing you back in April/2022 with Dr. Tillman Abide   1. Chronic respiratory failure with hypoxia (HCC)  Continue nighttime ventilator use  2. Tracheostomy present [facility])  Continue trach care  Continue to follow-up with ENT   We recommend today:  Orders Placed This Encounter  Procedures  . DG Chest 2 View    Standing Status:   Future    Standing Expiration Date:   07/27/2020    Order Specific Question:   Reason for Exam (SYMPTOM  OR DIAGNOSIS REQUIRED)    Answer:   chronic resp failure, hx of asp pnu    Order Specific Question:   Preferred imaging location?    Answer:   [facility]    Order Specific Question:   Radiology Contrast Protocol - do NOT remove file path    Answer:   [email or web address]   Orders Placed This Encounter  Procedures  . DG Chest 2 View   No orders of the defined types were placed in this encounter.   Follow Up:    Return in about 2 months (around 05/27/2020), or if symptoms worsen or fail to improve, for [facility] - Dr. Mortimer Fries.   Notification of test results are managed in the following manner: If there are  any recommendations or changes to the  plan of care discussed in office today,  we will contact you and let you know what they are. If you do not hear from [place], then your results are normal and you can view them through your  MyChart account , or a letter will be sent to you. Thank you again for trusting [place] with your care  - Thank you, [facility]    It is flu season:   >>> Best ways to protect herself from the flu: Receive the yearly flu vaccine, practice good hand hygiene washing with soap and also using hand  sanitizer when available, eat a nutritious meals, get adequate rest, hydrate appropriately       Please contact the office if your symptoms worsen or you have concerns that you are not improving.   Thank you for choosing [facility] for your healthcare, and for allowing [place] to partner with you on your healthcare journey. I am thankful to be able to provide care to you today.   Wyn Quaker FNP-C

## 2020-03-29 NOTE — Assessment & Plan Note (Signed)
Plan: Continue nocturnal trilogy ventilator We will request download from [employer]-community message sent

## 2020-04-12 DIAGNOSIS — N1831 Chronic kidney disease, stage 3a: Secondary | ICD-10-CM | POA: Insufficient documentation

## 2020-06-01 ENCOUNTER — Encounter: Payer: Self-pay | Admitting: Internal Medicine

## 2020-06-01 ENCOUNTER — Ambulatory Visit (INDEPENDENT_AMBULATORY_CARE_PROVIDER_SITE_OTHER): Payer: Medicare Other | Admitting: Internal Medicine

## 2020-06-01 ENCOUNTER — Other Ambulatory Visit: Payer: Self-pay

## 2020-06-01 VITALS — BP 110/70 | HR 75 | Temp 97.0°F | Ht 64.0 in | Wt 153.6 lb

## 2020-06-01 DIAGNOSIS — R0689 Other abnormalities of breathing: Secondary | ICD-10-CM

## 2020-06-01 DIAGNOSIS — Z430 Encounter for attention to tracheostomy: Secondary | ICD-10-CM | POA: Diagnosis not present

## 2020-06-01 DIAGNOSIS — J9612 Chronic respiratory failure with hypercapnia: Secondary | ICD-10-CM

## 2020-06-01 DIAGNOSIS — J9611 Chronic respiratory failure with hypoxia: Secondary | ICD-10-CM | POA: Diagnosis not present

## 2020-06-01 DIAGNOSIS — Z931 Gastrostomy status: Secondary | ICD-10-CM

## 2020-06-01 NOTE — Patient Instructions (Signed)
Continue trach therapy Continue ventilator therapy Continue PEG tube feedings Follow-up ENT

## 2020-06-01 NOTE — Progress Notes (Signed)
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  Evidence of pneumonia, MRSA. 01/22: CT Head revealed no CT evidence for acute intracranial abnormality. Atrophy and presumed chronic small vessel ischemic change of the white matter.  Sinusitis noted. 01/22: CTA Chest revealed no pulmonary embolus. Dilated fluid-filled esophagus from the thoracic inlet to the gastroesophageal junction, may be reflux or dysmotility. Achalasia also considered. Mild dependent opacities in both lower lobes, likely atelectasis. Given esophageal dilatation, aspiration is also considered. Subacute right rib fractures. Some interval incomplete fracture healing since October 2020 injury. Stable right upper lobe scarring and benign pulmonary nodules. Aortic Atherosclerosis (ICD10-I70.0). 01/23: Pt admitted to ICU 03/22/19- Patient is clincally improved, of mechanical ventilation. Starting PT/OT with plan to transition to hospitalist service. 1/28 returned to ICU due to episode of loss of consciousness on MedSurg floor.  This was due to recurrent issues with hypercapnia it was determined patient would need at the very least nighttime ventilation.  Patient placed on mechanical ventilation. 1/29 off vent during day, eating by mouth, speech evaluated.  Nighttime ventilation. 1/30 remains off vent during day, eating by mouth.  Continue nighttime mechanical ventilation.  Referred to DME for Trilogy  home ventilator. 2/01 husband and niece to be trained  on Trilogy vent. 2/02 unable to use Trilogy vent last night due to alarms going off.  DME company needs to review settings 2/03 successful overnight use of Trilogy ventilator, stable and ready for discharge    CHIEF COMPLAINT:  Chronic respiratory failure Chronic vent dependent Chronic trach Chronic PEG History of MRSA pneumonia  HISTORY OF PRESENT ILLNESS: Overall patient has been getting better with her strength Patient has chronic trach No acute issues at this time Patient is chronic vent dependent at nighttime She has a cuffed nonfenestrated trach Patient vocalizes by using her finger  No signs or symptoms of infection Patient still needs trilogy ventilator for survival  Primary nutrition no support is via PEG tube  No exacerbation at this time No evidence of heart failure at this time No evidence or signs of infection at this time No respiratory distress No fevers, chills, nausea, vomiting, diarrhea No evidence of lower extremity edema No evidence hemoptysis     PAST MEDICAL HISTORY :   has a past medical history of Breast cancer ([place]) (2002), Cancer ([place]) (2002), Diabetes mellitus without complication ([place]), Diabetic peripheral neuropathy ([place]) (08/28/2016), GERD (gastroesophageal reflux disease), Glaucoma, High cholesterol, History of colon polyps, Peripheral neuropathy (08/28/2016), Personal history of chemotherapy (2002), Personal history of malignant neoplasm of breast, Personal history of radiation therapy (2002), and Vocal cord paralysis (2012).  has a past surgical history that includes Abdominal hysterectomy; Hydrocele excision / repair; Colonoscopy (2009,12/30/2013); Tracheal surgery (2013); Breast surgery (Right, 2002); Tracheostomy (N/A); Cholecystectomy (N/A, 12/10/2017); Colonoscopy with propofol (N/A, 04/16/2018); Esophagogastroduodenoscopy (egd) with propofol (N/A, 10/14/2018); Colon resection (Right,  11/06/2018); Breast lumpectomy (Right, 2002); Breast biopsy (Right, 2008); and Breast excisional biopsy (Right, 2002). Prior to Admission medications   Medication Sig Start Date End Date Taking? Authorizing Provider  acetaminophen (TYLENOL) 325 MG tablet Take 2  tablets (650 mg total) by mouth every 6 (six) hours as needed for moderate pain or fever. 04/01/19   Tyler Pita, MD  Biotin 1000 MCG tablet Take 1,000 mcg by mouth daily.     [provider]  busPIRone (BUSPAR) 15 MG tablet Take 1 tablet (15 mg total) by mouth 2 (two) times daily. 04/01/19   Tyler Pita, MD  Calcium Carbonate-Vitamin D 600-400 MG-UNIT tablet Take 1 tablet by mouth daily.     [provider]  escitalopram (LEXAPRO) 20 MG tablet Take 20 mg by mouth daily.    [provider]  feeding supplement, ENSURE ENLIVE, (ENSURE ENLIVE) LIQD Take 237 mLs by mouth 2 (two) times daily between meals. 04/02/19   Tyler Pita, MD  folic acid (FOLVITE) 1 MG tablet Take 1 mg by mouth daily.    [provider]  gabapentin (NEURONTIN) 400 MG capsule Take 1 capsule (400 mg total) by mouth 3 (three) times daily. 04/01/19   Tyler Pita, MD  ipratropium-albuterol (DUONEB) 0.5-2.5 (3) MG/3ML SOLN Take 3 mLs by nebulization every 6 (six) hours as needed. 04/01/19   Tyler Pita, MD  iron polysaccharides (FERREX 150) 150 MG capsule Take by mouth. 04/30/18 04/30/19  [provider]  lovastatin (MEVACOR) 20 MG tablet Take 20 mg by mouth at bedtime.    [provider]  meclizine (ANTIVERT) 25 MG tablet Take 25 mg by mouth 2 (two) times daily as needed for dizziness.     [provider]  Melatonin 5 MG TABS Take 1 tablet (5 mg total) by mouth at bedtime. 04/01/19   Tyler Pita, MD  metFORMIN (GLUCOPHAGE) 1000 MG tablet Take 1,000 mg by mouth 2 (two) times daily as needed (for BS >110 in the morning and >150 in the evening).     [provider]  methocarbamol  (ROBAXIN) 500 MG tablet Take 1 tablet (500 mg total) by mouth every 8 (eight) hours as needed for muscle spasms. 12/29/18   Tylene Fantasia, PA-C  metoprolol tartrate (LOPRESSOR) 25 MG tablet Take 1 tablet (25 mg total) by mouth 2 (two) times daily. 04/01/19   Tyler Pita, MD  Nebulizers (COMPRESSOR/NEBULIZER) MISC Nebulizer with supplies, trach collar for med admin. 04/01/19   Tyler Pita, MD  ondansetron (ZOFRAN) 4 MG tablet Take 1 tablet (4 mg total) by mouth every 8 (eight) hours as needed for nausea or vomiting. 11/09/18   Pabon, Diego F, MD  pantoprazole (PROTONIX) 40 MG tablet Take 40 mg by mouth daily.     [provider]  RESTASIS 0.05 % ophthalmic emulsion Place 1 drop into both eyes 2 (two) times daily.     [provider]  rOPINIRole (REQUIP) 0.5 MG tablet Take 0.5 mg by mouth at bedtime.  06/09/18   [provider]   Allergies  Allergen Reactions  . Shellfish Allergy Shortness Of Breath    respitatory distress   . Sodium Sulfite Shortness Of Breath    Severe Congestion that creates inability to breath  . Sulfa Antibiotics Other (See Comments) and Shortness Of Breath    Other reaction(s): OTHER Other reaction(s): Other (See Comments) respitatory distress  Other reaction(s): OTHER respitatory distress  Other reaction(s): OTHER Other reaction(s): Other (See Comments) respitatory distress  respitatory distress   . Codeine Other (See Comments)    Per patient "it kept her up all night"    FAMILY HISTORY:  family history includes Cancer in her brother, father,  and sister. SOCIAL HISTORY:  reports that she has never smoked. She has never used smokeless tobacco. She reports that she does not drink alcohol and does not use drugs.     Review of Systems:  Gen:  Denies  fever, sweats, chills weight loss  HEENT: Denies blurred vision, double vision, ear pain, eye pain, hearing loss, nose bleeds, sore throat Cardiac:  No dizziness, chest pain  or heaviness, chest tightness,edema, No JVD Resp:   No cough, -sputum production, -shortness of breath,-wheezing, -hemoptysis,  Gi: Denies swallowing difficulty, stomach pain, nausea or vomiting, diarrhea, constipation, bowel incontinence Gu:  Denies bladder incontinence, burning urine Other:  All other systems negative    BP 110/70 (BP Location: Left Arm, Cuff Size: Normal)   Pulse 75   Temp (!) 97 F (36.1 C) (Temporal)   Ht 5\' 4"  (1.626 m)   Wt 153 lb 9.6 oz (69.7 kg)   SpO2 95%   BMI 26.37 kg/m    Physical Examination:   General Appearance: No distress  Neuro:without focal findings,  speech normal,  HEENT: PERRLA, EOM intact.  S/p trach Pulmonary: normal breath sounds, No wheezing.  CardiovascularNormal S1,S2.  No m/r/g.   Abdomen: Benign, Soft, non-tender. PEg tube in place Renal:  No costovertebral tenderness  GU:  Not performed at this time. Endoc: No evident thyromegaly Skin:   warm, no rashes, no ecchymosis  Extremities: normal, no cyanosis, clubbing. PSYCHIATRIC: Mood, affect within normal limits.   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 All questions answered  Follow up 6 months  Notnamed Scholz Patricia Pesa, M.D.  [facility]  Medical Director [place] Director [facility] Cardio-Pulmonary Department

## 2020-06-06 NOTE — Progress Notes (Incomplete)
[facility]  [address], [address] [place], [place] [address] Phone: [phone]  Fax: [phone]   Clinic Day:  06/06/2020  Referring physician: Lequita Asal, MD  Chief Complaint: Angela David is a 80 y.o. 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  HPI: The patient was last seen in the medical oncology clinic on 12/08/2019. At that time, she feels "ok".  Her sister passed last week. She denies any breast concerns or dental issues.  Exam is stable. Hematocrit was 39.2, hemoglobin 12.5, MCV 81.2, platelets 242,000, WBC 9,300. Creatinine was 1.17 (CrCl 45 ml/min). CEA was 12.3. CA27.29 was 23.3. She received Prolia.  The patient was seen in the [facility] ER on 01/07/2020 after a fall. Right shoulder x ray revealed a comminuted intra-articular fracture distal margin right clavicle, with minimal separation of the fracture fragment. She was placed in a right shoulder immobilizer and was to follow up with orthopedics in 1 week. Per patient, she could not take pain medications due to side effects. She was to take Tylenol at home.  The patient was admitted to [facility] from 01/15/2020 - 01/21/2020 for acute encephalopathy and altered mental status. Brain MRI showed only advanced chronic microvascular disease and atrophy, no stroke, mass or other findings to explain her confusion and hallucinations. CXR showed left pleural effusion with adjacent patchy airspace opacity which could be due to atelectasis and/or infectious etiology. She was felt to be aspirating. She was treated with Unasyn and Augmentin*** (?).  The patient was admitted to [facility] from 01/23/2020 - 02/13/2020. She presented with altered mental status and hypercarbic respiratory failure requiring mechanical ventilation and was admitted to the ICU. She was treated with cefepime and vancomycin. Gastrostomy tube was placed  on 01/29/2020. She tolerated trach collar trials during the day and home vent at night. She was discharged to a SNF***(?).  The patient saw Wyn Quaker, NP on 03/29/2020. She was doing well and had no acute respiratory concerns. She reported compliance with her nocturnal ventilator. Follow-up was planned for 2 months.  During the interim, ***   Past Medical History:  Diagnosis Date  . Breast cancer ([place]) 2002   right breast chemo and radiation  . Cancer [facility]) 2002   right breast  . Diabetes mellitus without complication ([place])   . Diabetic peripheral neuropathy ([place]) 08/28/2016  . GERD (gastroesophageal reflux disease)   . Glaucoma   . High cholesterol   . History of colon polyps   . Peripheral neuropathy 08/28/2016  . Personal history of chemotherapy 2002   BREAST CA  . Personal history of malignant neoplasm of breast   . Personal history of radiation therapy 2002   BREAST CA  . Vocal cord paralysis 2012    Past Surgical History:  Procedure Laterality Date  . ABDOMINAL HYSTERECTOMY    . BREAST BIOPSY Right 2008   neg  . BREAST EXCISIONAL BIOPSY Right 2002   positive  . BREAST LUMPECTOMY Right 2002   chemo and radiation  . BREAST SURGERY Right 2002   LUMPECTOMY, radiation, chemotherapy  . CHOLECYSTECTOMY N/A 12/10/2017   Procedure: LAPAROSCOPIC CHOLECYSTECTOMY;  Surgeon: Benjamine Sprague, DO;  Location: [facility] ORS;  Service: General;  Laterality: N/A;  . COLON RESECTION Right 11/06/2018   Procedure: HAND ASSISTED LAPAROSCOPIC COLON RESECTION, RIGHT;  Surgeon: Jules Husbands, MD;  Location: [facility] ORS;  Service: General;  Laterality: Right;  .  COLONOSCOPY  2009,12/30/2013  . COLONOSCOPY WITH PROPOFOL N/A 04/16/2018   Tubulovillous polyp proximal transverse colon, large sessile polyp, proximal transverse colon;  Surgeon: Robert Bellow, MD;  Location: [facility] ENDOSCOPY;  Service: Endoscopy;  Laterality: N/A;  . ESOPHAGOGASTRODUODENOSCOPY (EGD) WITH PROPOFOL N/A 10/14/2018   Procedure:  ESOPHAGOGASTRODUODENOSCOPY (EGD) WITH PROPOFOL;  Surgeon: Lucilla Lame, MD;  Location: [facility] ENDOSCOPY;  Service: Endoscopy;  Laterality: N/A;  . HYDROCELE EXCISION / REPAIR    . TRACHEAL SURGERY  2013  . TRACHEOSTOMY N/A    Dr Kathyrn Sheriff    Family History  Problem Relation Age of Onset  . Cancer Father        bone cancer  . Cancer Sister        lymphoma  . Cancer Brother   . Breast cancer Neg Hx     Social History:  reports that she has never smoked. She has never used smokeless tobacco. She reports that she does not drink alcohol and does not use drugs.  She denies any smoking. She worked in [employer] (medical) for 26 years. She is married and her husband's name is [name].  She lives in [place]. The patient is alone ***today.   Allergies:  Allergies  Allergen Reactions  . Shellfish Allergy Shortness Of Breath    respitatory distress   . Sodium Sulfite Shortness Of Breath    Severe Congestion that creates inability to breath  . Sulfa Antibiotics Other (See Comments) and Shortness Of Breath    Other reaction(s): OTHER Other reaction(s): Other (See Comments) respitatory distress  Other reaction(s): OTHER respitatory distress  Other reaction(s): OTHER Other reaction(s): Other (See Comments) respitatory distress  respitatory distress   . Codeine Other (See Comments)    Per patient "it kept her up all night"    Current Medications: Current Outpatient Medications  Medication Sig Dispense Refill  . albuterol (PROVENTIL) (2.5 MG/3ML) 0.083% nebulizer solution Take 3 mLs (2.5 mg total) by nebulization every 4 (four) hours as needed for wheezing or shortness of breath. 75 mL 12  . busPIRone (BUSPAR) 30 MG tablet 1 tablet (30 mg total) by Per NG tube route 2 (two) times daily.    Marland Kitchen enoxaparin (LOVENOX) 40 MG/0.4ML injection Inject 0.4 mLs (40 mg total) into the skin daily. 0 mL   . escitalopram (LEXAPRO) 10 MG tablet Place 3 tablets (30 mg total) into feeding tube daily.    . folic acid  (FOLVITE) 1 MG tablet Place 1 tablet (1 mg total) into feeding tube daily.    Marland Kitchen gabapentin (NEURONTIN) 250 MG/5ML solution Place 12 mLs (600 mg total) into feeding tube every 8 (eight) hours.  12  . melatonin 5 MG TABS Place 1 tablet (5 mg total) into feeding tube at bedtime.  0  . pantoprazole sodium (PROTONIX) 40 mg/20 mL PACK Place 20 mLs (40 mg total) into feeding tube at bedtime. 30 mL   . pravastatin (PRAVACHOL) 20 MG tablet 1 tablet (20 mg total) by Per NG tube route daily at 6 PM.    . rOPINIRole (REQUIP) 0.5 MG tablet Place 1 tablet (0.5 mg total) into feeding tube at bedtime.     No current facility-administered medications for this visit.    Review of Systems  Constitutional: Negative for chills, diaphoresis, fever, malaise/fatigue and weight loss (up 12 lbs).       Doing ok.  HENT: Negative for congestion, ear discharge, ear pain, hearing loss, nosebleeds, sinus pain, sore throat and tinnitus.  S/p tracheostomy. Able to swallow normally.  Eyes: Negative for blurred vision.  Respiratory: Positive for shortness of breath (secondary to tracheostomy; better). Negative for cough, hemoptysis and sputum production.   Cardiovascular: Negative for chest pain, palpitations and leg swelling.  Gastrointestinal: Negative for abdominal pain, blood in stool, constipation, diarrhea, heartburn, melena, nausea and vomiting.  Genitourinary: Negative for dysuria, frequency, hematuria and urgency.       Recurrent UTIs  Musculoskeletal: Positive for back pain (degenerative disc; upper back). Negative for falls, joint pain, myalgias and neck pain.       Broken left foot.  Skin: Negative for itching and rash.  Neurological: Negative for dizziness, tingling, tremors, sensory change, speech change, weakness and headaches.  Endo/Heme/Allergies: Does not bruise/bleed easily.  Psychiatric/Behavioral: Positive for depression (due to her health and passing of sister last week). Negative for memory loss.  The patient is not nervous/anxious and does not have insomnia.   All other systems reviewed and are negative.   Performance status (ECOG): 2***  Vitals There were no vitals taken for this visit.   Physical Exam Vitals and nursing note reviewed.  Constitutional:      General: She is not in acute distress.    Appearance: She is well-developed. She is not diaphoretic.     Comments: Requires assistance onto exam table.  HENT:     Head: Normocephalic and atraumatic.     Comments: Short gray hair.    Mouth/Throat:     Mouth: Mucous membranes are moist.     Pharynx: Oropharynx is clear. No oropharyngeal exudate.  Eyes:     General: No scleral icterus.    Conjunctiva/sclera: Conjunctivae normal.     Pupils: Pupils are equal, round, and reactive to light.     Comments: Glasses.  Brown eyes.  Neck:     Comments: Tracheostomy. Cardiovascular:     Rate and Rhythm: Normal rate and regular rhythm.     Heart sounds: Normal heart sounds. No murmur heard.   Pulmonary:     Effort: Pulmonary effort is normal. No respiratory distress.     Breath sounds: Normal breath sounds. No wheezing or rales.  Chest:     Chest wall: No tenderness.  Breasts:     Right: Skin change (fibrocystic changes and edema inferiorly) and tenderness present. No inverted nipple, mass, nipple discharge, axillary adenopathy or supraclavicular adenopathy.     Left: Skin change (fibrocystic changes at 1, 3, and 5-6 o'clock) present. No inverted nipple, mass, nipple discharge, tenderness, axillary adenopathy or supraclavicular adenopathy.    Abdominal:     General: Bowel sounds are normal. There is no distension.     Palpations: Abdomen is soft. There is no mass.     Tenderness: There is no abdominal tenderness. There is no guarding or rebound.  Musculoskeletal:        General: No swelling or tenderness. Normal range of motion.     Cervical back: Normal range of motion and neck supple.     Right lower leg: No edema.      Left lower leg: No edema.  Lymphadenopathy:     Head:     Right side of head: No preauricular, posterior auricular or occipital adenopathy.     Left side of head: No preauricular, posterior auricular or occipital adenopathy.     Cervical: No cervical adenopathy.     Upper Body:     Right upper body: No supraclavicular or axillary adenopathy.     Left upper body:   No supraclavicular or axillary adenopathy.     Lower Body: No right inguinal adenopathy. No left inguinal adenopathy.  Skin:    General: Skin is warm and dry.  Neurological:     Mental Status: She is alert and oriented to person, place, and time.  Psychiatric:        Behavior: Behavior normal.        Thought Content: Thought content normal.        Judgment: Judgment normal.    No visits with results within 3 Day(s) from this visit.  Latest known visit with results is:  No results displayed because visit has over 200 results.      Assessment:  Angela David is a 80 y.o. female with stage II invasive right breast cancer s/p lumpectomy with sentinel lymph node biopsy on 08/14/2000.  Pathology revealed a 1.0 to 1.5 cm grade II IDC.  There was evidence of lymphatic invasion.  Margins were clear.  Tumor was ER-, PR+, and Her2/neu -. Subsequent SLN and axillary dissection confirmed metastatic carcinoma in 1 lymph node (pathology unavailable).  Pathologic stage was T1cN1M0.    She received epirubicin and cyclophosphamide (EC) for 4 cycles (09/2000 - 01/2001).  She received radiation in 02/2001 - 03/2001. She received tamoxifen for five years (ended ~ 02/2006).   Bilateral mammogram on 01/29/2018 revealed no evidence of malignancy.  Bilateral screening mammogram on 04/08/2019 showed no evidence of malignancy.   CEA has been followed:  11.4 on 12/21/2015, 8.1 on 02/22/2016, 7.2 on 06/29/2016, 10.1 on 01/15/2017, 11.3 on 09/03/2017, 9.4 on 03/04/2018, 9.6 on 09/03/2018, 11.4 on 05/18/2019, and 12.3 on 12/08/2019.  CA27.29  has been followed:  37.5 on 12/21/2015, 31.4 on 06/30/2016, 29.5 on 01/04/2017, 24.4 on 09/03/2017, 27.8 on 03/04/2018, 27.8 on 03/04/2018, 32.7 on 05/18/2019, and 23.3 on 12/08/2019.  PET scan on 09/16/2018 revealed no evidence of metabolically active malignancy or metastatic disease.   Bone density on 03/25/2014 revealed osteopenia with a T-score of -1.4 in the left femoral neck.  Bone density on 04/09/2018 revealed osteopenia with a T-score of -1.4 in the right femoral neck.  She has received Prolia every 6 months (01/18/2016 - 05/18/2019).  Colonoscopy on 04/16/2018 revealed two 6 to 12 mm polyps in the transverse colon (removed), one 45 mm polyp in the proximal transverse colon, and one 7 mm polyp at the recto-sigmoid colon (removed).  Treatment for the larger 4.5 cm polyp in the transverse colon was unsuccessful.  The lesion was tatooed.  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  There was no dysplasia or malignancy.    She underwent a laparoscopic right hemicolectomy with stapled ileocolostomy on 11/06/2018.  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 There were 25 lymph nodes negative for malignancy. Surgical margins were free of of dysplasia  She is s/p tracheostomy for tracheal stenosis.  She stopped using the ventilator at night in 07/2019. She no longer needs oxygen.  Symptomatically, ***  Plan: 1.   Labs today: CBC with diff, CMP, CA27.29   2.   Stage II right breast cancer             Clinically, she is doing well.  Exam reveals no evidence of recurrent disease.              She is s/p lumpectomy, adjuvant chemotherapy, radiation, and 5 years of tamoxifen.  Bilateral screening mammogram on 04/08/2019 showed no evidence of malignancy.   CA 27.29 is normal.             Schedule mammogram on  04/08/2020.  Continue to monitor. 3.   Elevated CEA             Patient has had an elevated CEA since 11/2015.   CEA has ranged between 7.2 - 11.4.   CEA is 12.3.             Etiology remains uncertain.             PET scan on 09/16/2018 revealed no evidence of metabolically active disease.  Follow-up CEA.  If repeat is > 15% above baseline, will repeat imaging.  Continue to monitor. 4.   Osteopenia             Bone density on 04/09/2018 revealed osteopenia.             She receives Prolia every 6 months (last 05/18/2019).  Prolia today. 5.   Transverse colon polyp             Patient is s/p right laparoscopic right hemicolectomy on 11/06/2018.  Pathology reviewed- tubular adenoma.  Follow-up with GI for surveillance endoscopies. 6.   Prolia today. 7.   Mammogram on 04/08/2020. 8.   RTC in 6 months for MD assessment, labs (CBC with diff, CMP, CA27.29), review of mammogram and Prolia.  I discussed the assessment and treatment plan with the patient.  The patient was provided an opportunity to ask questions and all were answered.  The patient agreed with the plan and demonstrated an understanding of the instructions.  The patient was advised to call back if the symptoms worsen or if the condition fails to improve as anticipated.  I provided *** minutes of face-to-face time during this this encounter and > 50% was spent counseling as documented under my assessment and plan.  Lequita Asal, MD, PhD    06/06/2020, 2:30 PM  I, Mirian Mo Tufford, am acting as a Education administrator for [employer]. Mike Gip, MD.   I, Melissa C. Mike Gip, MD, have reviewed the above documentation for accuracy and completeness, and I agree with the above.

## 2020-06-07 ENCOUNTER — Inpatient Hospital Stay: Payer: Medicare Other | Attending: Hematology and Oncology | Admitting: Hematology and Oncology

## 2020-06-07 ENCOUNTER — Telehealth: Payer: Self-pay | Admitting: Neurology

## 2020-06-07 ENCOUNTER — Encounter: Payer: Self-pay | Admitting: Neurology

## 2020-06-07 ENCOUNTER — Other Ambulatory Visit: Payer: Self-pay

## 2020-06-07 ENCOUNTER — Other Ambulatory Visit: Payer: Medicare Other

## 2020-06-07 ENCOUNTER — Ambulatory Visit: Payer: Medicare Other

## 2020-06-07 ENCOUNTER — Ambulatory Visit (INDEPENDENT_AMBULATORY_CARE_PROVIDER_SITE_OTHER): Payer: Medicare Other | Admitting: Neurology

## 2020-06-07 VITALS — BP 115/59 | HR 70 | Ht 64.0 in | Wt 157.6 lb

## 2020-06-07 DIAGNOSIS — R970 Elevated carcinoembryonic antigen [CEA]: Secondary | ICD-10-CM | POA: Insufficient documentation

## 2020-06-07 DIAGNOSIS — R269 Unspecified abnormalities of gait and mobility: Secondary | ICD-10-CM

## 2020-06-07 DIAGNOSIS — Z853 Personal history of malignant neoplasm of breast: Secondary | ICD-10-CM | POA: Insufficient documentation

## 2020-06-07 DIAGNOSIS — M6281 Muscle weakness (generalized): Secondary | ICD-10-CM | POA: Diagnosis not present

## 2020-06-07 DIAGNOSIS — Z9221 Personal history of antineoplastic chemotherapy: Secondary | ICD-10-CM | POA: Insufficient documentation

## 2020-06-07 DIAGNOSIS — M8580 Other specified disorders of bone density and structure, unspecified site: Secondary | ICD-10-CM | POA: Insufficient documentation

## 2020-06-07 DIAGNOSIS — N6310 Unspecified lump in the right breast, unspecified quadrant: Secondary | ICD-10-CM | POA: Insufficient documentation

## 2020-06-07 NOTE — Telephone Encounter (Signed)
Medicare/bankers order sent to GI. No auth they will reach out to the patient to schedule.

## 2020-06-07 NOTE — Progress Notes (Signed)
Reason for visit: Gait disorder, swallowing problems, shortness of breath  Referring physician: Dr. Quinn Plowman [place] Angela David is a 80 y.o. 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  As time has gone on, this issue is become more prominent, she has to move her head in order to look at moving objects or read a book.  She does have a history of diabetes and a chronic gait disorder.  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  The patient has had issues with laryngospasm, she required a tracheotomy.  More recently, she has required a ventilator in the evening hours because she has hypoventilation and CO2 narcosis.  The patient has also had increasing problems with swallowing, she now has a feeding tube as of November 2021.  She was in the hospital for 6 weeks or more getting rehab following a fall with rib fractures and a pneumothorax that occurred in November 2021.  The patient has had increasing problems with pain between the shoulders and in the neck and with the low back.  She has chronic issues with numbness of the entirety of the left leg up to the hip.  She denies issues controlling the bowels or the bladder.  She feels weakness in the legs but also has weakness of the arm.  As time is gone on, she has developed a head tilt to the left.  She has a prior history of breast cancer.  She did have a recent MRI of the brain that continues to show chronic small vessel changes.  She returns at this office for further evaluation.  Past Medical History:  Diagnosis Date  . Breast cancer ([place]) 2002   right breast chemo and radiation  . Cancer [facility]) 2002   right breast  . Diabetes mellitus  without complication ([place])   . Diabetic peripheral neuropathy ([place]) 08/28/2016  . GERD (gastroesophageal reflux disease)   . Glaucoma   . High cholesterol   . History of colon polyps   . Peripheral neuropathy 08/28/2016  . Personal history of chemotherapy 2002   BREAST CA  . Personal history of malignant neoplasm of breast   . Personal history of radiation therapy 2002   BREAST CA  . Vocal cord paralysis 2012    Past Surgical History:  Procedure Laterality Date  . ABDOMINAL HYSTERECTOMY    . BREAST BIOPSY Right 2008   neg  . BREAST EXCISIONAL BIOPSY Right 2002   positive  . BREAST LUMPECTOMY Right 2002   chemo and radiation  . BREAST SURGERY Right 2002   LUMPECTOMY, radiation, chemotherapy  . CHOLECYSTECTOMY N/A 12/10/2017   Procedure: LAPAROSCOPIC CHOLECYSTECTOMY;  Surgeon: Benjamine Sprague, DO;  Location: [facility] ORS;  Service: General;  Laterality: N/A;  . COLON RESECTION Right 11/06/2018   Procedure: HAND ASSISTED LAPAROSCOPIC COLON RESECTION, RIGHT;  Surgeon: Jules Husbands, MD;  Location: [facility] ORS;  Service: General;  Laterality: Right;  . COLONOSCOPY  2009,12/30/2013  . COLONOSCOPY WITH PROPOFOL N/A 04/16/2018   Tubulovillous polyp proximal transverse colon, large sessile polyp, proximal transverse colon;  Surgeon: Robert Bellow, MD;  Location: [facility] ENDOSCOPY;  Service: Endoscopy;  Laterality: N/A;  . ESOPHAGOGASTRODUODENOSCOPY (EGD) WITH PROPOFOL N/A 10/14/2018  Procedure: ESOPHAGOGASTRODUODENOSCOPY (EGD) WITH PROPOFOL;  Surgeon: Lucilla Lame, MD;  Location[facility] ENDOSCOPY;  Service: Endoscopy;  Laterality: N/A;  . HYDROCELE EXCISION / REPAIR    . TRACHEAL SURGERY  2013  . TRACHEOSTOMY N/A    Dr Kathyrn Sheriff    Family History  Problem Relation Age of Onset  . Cancer Father        bone cancer  . Cancer Sister        lymphoma  . Cancer Brother   . Breast cancer Neg Hx     Social history:  reports that she has never smoked. She has never used smokeless tobacco. She reports  that she does not drink alcohol and does not use drugs.  Medications:  Prior to Admission medications   Medication Sig Start Date End Date Taking? Authorizing Provider  busPIRone (BUSPAR) 30 MG tablet 1 tablet (30 mg total) by Per NG tube route 2 (two) times daily. 02/13/20  Yes Kasa, Maretta Bees, MD  escitalopram (LEXAPRO) 10 MG tablet Place 3 tablets (30 mg total) into feeding tube daily. 02/13/20  Yes Flora Lipps, MD  folic acid (FOLVITE) 1 MG tablet Place 1 tablet (1 mg total) into feeding tube daily. 02/13/20  Yes Flora Lipps, MD  gabapentin (NEURONTIN) 250 MG/5ML solution Place 12 mLs (600 mg total) into feeding tube every 8 (eight) hours. 02/13/20  Yes Kasa, Maretta Bees, MD  melatonin 5 MG TABS Place 1 tablet (5 mg total) into feeding tube at bedtime. 02/13/20  Yes Flora Lipps, MD  pantoprazole sodium (PROTONIX) 40 mg/20 mL PACK Place 20 mLs (40 mg total) into feeding tube at bedtime. 02/13/20  Yes Kasa, Maretta Bees, MD  rOPINIRole (REQUIP) 0.5 MG tablet Place 1 tablet (0.5 mg total) into feeding tube at bedtime. 02/13/20  Yes Flora Lipps, MD  albuterol (PROVENTIL) (2.5 MG/3ML) 0.083% nebulizer solution Take 3 mLs (2.5 mg total) by nebulization every 4 (four) hours as needed for wheezing or shortness of breath. 02/13/20   Flora Lipps, MD  enoxaparin (LOVENOX) 40 MG/0.4ML injection Inject 0.4 mLs (40 mg total) into the skin daily. 02/13/20   Flora Lipps, MD  pravastatin (PRAVACHOL) 20 MG tablet 1 tablet (20 mg total) by Per NG tube route daily at 6 PM. 02/13/20   Flora Lipps, MD      Allergies  Allergen Reactions  . Shellfish Allergy Shortness Of Breath    respitatory distress   . Sodium Sulfite Shortness Of Breath    Severe Congestion that creates inability to breath  . Sulfa Antibiotics Other (See Comments) and Shortness Of Breath    Other reaction(s): OTHER Other reaction(s): Other (See Comments) respitatory distress  Other reaction(s): OTHER respitatory distress  Other reaction(s):  OTHER Other reaction(s): Other (See Comments) respitatory distress  respitatory distress   . Codeine Other (See Comments)    Per patient "it kept her up all night"    ROS:  Out of a complete 14 system review of symptoms, the patient complains only of the following symptoms, and all other reviewed systems are negative.  Walking difficulty Neck pain Low back pain Weakness  Blood pressure (!) 115/59, pulse 70, height 5\' 4"  (1.626 m), weight 157 lb 9.6 oz (71.5 kg).  Physical Exam  General: The patient is alert and cooperative at the time of the examination.  Eyes: Pupils are equal, round, and reactive to light. Discs are flat bilaterally.  Neck: The neck is supple, no carotid bruits are noted.  Respiratory: The respiratory examination is clear.  Cardiovascular: The  cardiovascular examination reveals a regular rate and rhythm, a grade II/VI systolic ejection murmur maximal at the aortic area is noted.  Skin: Extremities are without significant edema.  Neurologic Exam  Mental status: The patient is alert and oriented x 3 at the time of the examination. The patient has apparent normal recent and remote memory, with an apparently normal attention span and concentration ability.  Cranial nerves: Facial symmetry is present. There is good sensation of the face to pinprick and soft touch bilaterally. The strength of the facial muscles and the muscles to head turning and shoulder shrug are normal bilaterally. Speech is well enunciated, no aphasia or dysarthria is noted, the patient has to speak by blocking the trach. Extraocular movements are very limited in all fields of gaze. Visual fields are full. The tongue is midline, and the patient has symmetric elevation of the soft palate. No obvious hearing deficits are noted.  The head is tilted slightly to the left.  Motor: The motor testing reveals 5 over 5 strength of all 4 extremities, with exception of 4+/5 strength with biceps and triceps  muscles bilaterally and 4/5 strength with hip flexion bilaterally. Good symmetric motor tone is noted throughout.  Sensory: Sensory testing is intact to pinprick, soft touch, vibration sensation, and position sense on all 4 extremities, with exception of decreased pinprick sensation and position sensation and vibration sensation of the left leg. No evidence of extinction is noted.  Coordination: Cerebellar testing reveals good finger-nose-finger and heel-to-shin bilaterally.  Gait and station: Gait is wide-based, slightly unsteady.  The patient is unable perform tandem gait.  Romberg is negative but is unsteady.  Reflexes: Deep tendon reflexes are symmetric, but are depressed bilaterally. Toes are downgoing bilaterally.   Assessment/Plan:  1.  External ophthalmoplegia  2.  Diabetic peripheral neuropathy  3.  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  Chronic gait disorder, falls  5.  Dysphagia  6.  Hypoventilation, nocturnal  The patient has prominent eye movement limitation, this could suggest the possibility of an underlying mitochondrial disorder, the patient reports no family history of this.  The patient does have some weakness of all 4 extremities.  The patient will be sent for blood work today.  She will be sent for MRI of the cervical and lumbar spine.  We may consider repeat EMG evaluation to look at an arm and the leg to evaluate for possible underlying myopathic disorder.  The patient will follow up here in 3 months.  Jill Alexanders MD 06/07/2020 10:06 AM  [facility] [address] [place], [place] [address]  Phone [phone] Fax [phone]

## 2020-06-09 LAB — COPPER, SERUM: Copper: 134 ug/dL (ref 80–158)

## 2020-06-09 LAB — ACETYLCHOLINE RECEPTOR, BINDING: AChR Binding Ab, Serum: <0.03 nmol/L (ref 0.00–0.24)

## 2020-06-09 LAB — SEDIMENTATION RATE: Sed Rate: 12 mm/hr (ref 0–40)

## 2020-06-09 LAB — VGCC ANTIBODY: VGCC Antibody: 5.7 pmol/L (ref 0.0–30.0)

## 2020-06-09 LAB — CK: Total CK: 42 U/L (ref 32–182)

## 2020-06-09 LAB — LACTIC ACID, PLASMA: Lactate, Ven: 22.7 mg/dL (ref 4.8–25.7)

## 2020-06-11 ENCOUNTER — Other Ambulatory Visit: Payer: Self-pay

## 2020-06-11 ENCOUNTER — Ambulatory Visit
Admission: RE | Admit: 2020-06-11 | Discharge: 2020-06-11 | Disposition: A | Discharge status: Home / Self Care | Payer: Medicare Other | Source: Ambulatory Visit | Attending: Neurology | Admitting: Neurology

## 2020-06-11 DIAGNOSIS — M6281 Muscle weakness (generalized): Secondary | ICD-10-CM

## 2020-06-11 DIAGNOSIS — R269 Unspecified abnormalities of gait and mobility: Secondary | ICD-10-CM

## 2020-06-13 ENCOUNTER — Telehealth: Payer: Self-pay | Admitting: Neurology

## 2020-06-13 ENCOUNTER — Telehealth: Payer: Self-pay | Admitting: *Deleted

## 2020-06-13 DIAGNOSIS — M6281 Muscle weakness (generalized): Secondary | ICD-10-CM

## 2020-06-13 NOTE — Telephone Encounter (Signed)
-----   Message from Kathrynn Ducking, MD sent at 06/09/2020  5:00 PM EDT -----  The blood work results are unremarkable. Please call the patient.  ----- Message ----- From: Lavone Neri Lab Results In Sent: 06/08/2020   7:37 AM EDT To: Kathrynn Ducking, MD

## 2020-06-13 NOTE — Telephone Encounter (Signed)
Called and spoke w/ pt husband about results per Dr. Jannifer Franklin' note. He verbalized understanding.

## 2020-06-13 NOTE — Telephone Encounter (Signed)
I called the patient.  The MRI of the cervical spine and lumbar spine does not show significant compromise of the spinal canal that would affect weakness of all 4 extremities.  Patient does have a diabetic neuropathy and small vessel disease that could affect balance, but she has dysphagia and progressive external ophthalmoplegia that could represent a mitochondrial myopathy.  We will check nerve conduction studies of the arm and the leg and check EMG of the arm and the leg.  I will get this set up.   MRI lumbar 06/12/20:  IMPRESSION: This MRI of the lumbar spine without contrast shows the following: 1.    There is lumbarization of the S1 vertebral body.  This is a normal variant 2.    At L2-L3, there is mild spinal stenosis due to degenerative changes but no nerve root compression. 3.    At L3-L4, there are degenerative changes causing mild left foraminal and lateral recess stenosis but no spinal stenosis or nerve root compression.   4.    At L4-L5, there is mild spinal stenosis of the transverse diameter and mild to moderate right greater than left lateral recess stenosis and moderate right foraminal narrowing but no nerve root compression. 5.    At L5-L6 (S1), there are degenerative changes causing mild left foraminal narrowing but no nerve root compression or spinal stenosis. 6.    At L6 (S1)-S2, there are degenerative changes but no nerve root compression.   MRI cervical 06/12/20:  IMPRESSION: This MRI of the cervical spine without contrast shows the following: 1.    The spinal cord appears normal. 2.    At C5-C6 there is moderate spinal stenosis and moderate bilateral foraminal narrowing due to degenerative changes detailed above.  There does not appear to be any definite nerve root compression. 3.    At C6-C7, there is mild spinal stenosis and mild to moderate left foraminal narrowing but no nerve root compression. 4.    Milder degenerative changes at other levels as detailed above that do  not lead to nerve root compression or spinal stenosis. 5.    No significant progression compared to the MRI from 09/24/2016.

## 2020-06-22 ENCOUNTER — Other Ambulatory Visit: Payer: Self-pay

## 2020-06-22 DIAGNOSIS — D126 Benign neoplasm of colon, unspecified: Secondary | ICD-10-CM

## 2020-06-22 DIAGNOSIS — C50011 Malignant neoplasm of nipple and areola, right female breast: Secondary | ICD-10-CM

## 2020-06-22 DIAGNOSIS — R970 Elevated carcinoembryonic antigen [CEA]: Secondary | ICD-10-CM

## 2020-06-22 DIAGNOSIS — Z171 Estrogen receptor negative status [ER-]: Secondary | ICD-10-CM

## 2020-06-23 ENCOUNTER — Inpatient Hospital Stay (HOSPITAL_BASED_OUTPATIENT_CLINIC_OR_DEPARTMENT_OTHER): Payer: Medicare Other | Admitting: Oncology

## 2020-06-23 ENCOUNTER — Encounter: Payer: Self-pay | Admitting: Oncology

## 2020-06-23 ENCOUNTER — Inpatient Hospital Stay: Payer: Medicare Other

## 2020-06-23 ENCOUNTER — Other Ambulatory Visit: Payer: Self-pay

## 2020-06-23 VITALS — BP 107/53 | HR 86 | Temp 98.0°F | Resp 18 | Ht 64.0 in | Wt 159.6 lb

## 2020-06-23 DIAGNOSIS — C50811 Malignant neoplasm of overlapping sites of right female breast: Secondary | ICD-10-CM

## 2020-06-23 DIAGNOSIS — N6310 Unspecified lump in the right breast, unspecified quadrant: Secondary | ICD-10-CM | POA: Diagnosis not present

## 2020-06-23 DIAGNOSIS — R970 Elevated carcinoembryonic antigen [CEA]: Secondary | ICD-10-CM | POA: Diagnosis not present

## 2020-06-23 DIAGNOSIS — C50011 Malignant neoplasm of nipple and areola, right female breast: Secondary | ICD-10-CM

## 2020-06-23 DIAGNOSIS — N6311 Unspecified lump in the right breast, upper outer quadrant: Secondary | ICD-10-CM

## 2020-06-23 DIAGNOSIS — Z171 Estrogen receptor negative status [ER-]: Secondary | ICD-10-CM

## 2020-06-23 DIAGNOSIS — D126 Benign neoplasm of colon, unspecified: Secondary | ICD-10-CM

## 2020-06-23 DIAGNOSIS — Z9221 Personal history of antineoplastic chemotherapy: Secondary | ICD-10-CM | POA: Diagnosis not present

## 2020-06-23 DIAGNOSIS — M8580 Other specified disorders of bone density and structure, unspecified site: Secondary | ICD-10-CM | POA: Diagnosis not present

## 2020-06-23 DIAGNOSIS — M85851 Other specified disorders of bone density and structure, right thigh: Secondary | ICD-10-CM

## 2020-06-23 DIAGNOSIS — Z853 Personal history of malignant neoplasm of breast: Secondary | ICD-10-CM | POA: Diagnosis not present

## 2020-06-23 DIAGNOSIS — M85852 Other specified disorders of bone density and structure, left thigh: Secondary | ICD-10-CM

## 2020-06-23 LAB — COMPREHENSIVE METABOLIC PANEL
ALT: 13 U/L (ref 0–44)
AST: 32 U/L (ref 15–41)
Albumin: 4 g/dL (ref 3.5–5.0)
Alkaline Phosphatase: 58 U/L (ref 38–126)
Anion gap: 10 (ref 5–15)
BUN: 25 mg/dL — ABNORMAL HIGH (ref 8–23)
CO2: 21 mmol/L — ABNORMAL LOW (ref 22–32)
Calcium: 9.5 mg/dL (ref 8.9–10.3)
Chloride: 101 mmol/L (ref 98–111)
Creatinine, Ser: 0.95 mg/dL (ref 0.44–1.00)
GFR, Estimated: >60 mL/min (ref >60)
Glucose, Bld: 121 mg/dL — ABNORMAL HIGH (ref 70–99)
Potassium: 4.6 mmol/L (ref 3.5–5.1)
Sodium: 132 mmol/L — ABNORMAL LOW (ref 135–145)
Total Bilirubin: 0.5 mg/dL (ref 0.3–1.2)
Total Protein: 7.9 g/dL (ref 6.5–8.1)

## 2020-06-23 LAB — CBC WITH DIFFERENTIAL/PLATELET
Abs Immature Granulocytes: 0.07 10*3/uL (ref 0.00–0.07)
Basophils Absolute: 0.1 10*3/uL (ref 0.0–0.1)
Basophils Relative: 1 %
Eosinophils Absolute: 0.2 10*3/uL (ref 0.0–0.5)
Eosinophils Relative: 1 %
HCT: 40.6 % (ref 36.0–46.0)
Hemoglobin: 13.1 g/dL (ref 12.0–15.0)
Immature Granulocytes: 1 %
Lymphocytes Relative: 16 %
Lymphs Abs: 1.7 10*3/uL (ref 0.7–4.0)
MCH: 26.6 pg (ref 26.0–34.0)
MCHC: 32.3 g/dL (ref 30.0–36.0)
MCV: 82.4 fL (ref 80.0–100.0)
Monocytes Absolute: 0.5 10*3/uL (ref 0.1–1.0)
Monocytes Relative: 5 %
Neutro Abs: 7.9 10*3/uL — ABNORMAL HIGH (ref 1.7–7.7)
Neutrophils Relative %: 76 %
Platelets: 236 10*3/uL (ref 150–400)
RBC: 4.93 MIL/uL (ref 3.87–5.11)
RDW: 14.3 % (ref 11.5–15.5)
WBC: 10.4 10*3/uL (ref 4.0–10.5)
nRBC: 0 % (ref 0.0–0.2)

## 2020-06-23 MED ORDER — DENOSUMAB 60 MG/ML ~~LOC~~ SOSY
60.0000 mg | PREFILLED_SYRINGE | Freq: Once | SUBCUTANEOUS | Status: AC
  Administered 2020-06-23: 60 mg via SUBCUTANEOUS

## 2020-06-23 MED ORDER — NYSTATIN 100000 UNIT/GM EX POWD: 1.0000 "application " | Freq: Three times a day (TID) | CUTANEOUS | 0 refills | Status: DC

## 2020-06-23 NOTE — Progress Notes (Signed)
Error

## 2020-06-23 NOTE — Progress Notes (Signed)
Found R breast lump on Saturday.

## 2020-06-23 NOTE — Progress Notes (Signed)
[facility]  [address], [address] [place], [place] [address] Phone: [phone]  Fax: [phone]   Clinic Day:  06/23/2020  Referring physician: Jodi Marble, MD  Chief Complaint: Angela David is a 80 y.o. 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   HPI: The patient was last seen in the medical oncology clinic on 12/08/2019. She had been doing okay. She reported feeling sad d/t all of her medical conditions.   In the interim, she was hospitalized from 01/23/2020-02/13/2020 for aspiration pneumonia and chronic respiratory failure with chronic trach d/t non compliance with home nocturnal vent for months. Required Peg tube placment on 01/29/20 for worsening dysphagia and aspiration d/t vocal cord dysfunction and stenosis.  The patient reports weight gain. She continues to have trouble swallowing and is receiving nutrients mainly though her PEG tube. Her upper back pain is stable. She performs regular breast self exams and recently discovered a new right breast mass. She is due for a mammogram.   She was seen outpatient by pulmonology for follow-up after recent hospitalization by Dr. Mortimer Fries.  She was instructed to continue nocturnal trilogy ventilator.   Past Medical History:  Diagnosis Date  . Breast cancer ([place]) 2002   right breast chemo and radiation  . Cancer [facility]) 2002   right breast  . Diabetes mellitus without complication ([place])   . Diabetic peripheral neuropathy ([place]) 08/28/2016  . GERD (gastroesophageal reflux disease)   . Glaucoma   . High cholesterol   . History of colon polyps   . Peripheral neuropathy 08/28/2016  . Personal history of chemotherapy 2002   BREAST CA  . Personal history of malignant neoplasm of breast   . Personal history of radiation therapy 2002   BREAST CA  . Vocal cord paralysis 2012    Past Surgical History:  Procedure Laterality Date  . ABDOMINAL  HYSTERECTOMY    . BREAST BIOPSY Right 2008   neg  . BREAST EXCISIONAL BIOPSY Right 2002   positive  . BREAST LUMPECTOMY Right 2002   chemo and radiation  . BREAST SURGERY Right 2002   LUMPECTOMY, radiation, chemotherapy  . CHOLECYSTECTOMY N/A 12/10/2017   Procedure: LAPAROSCOPIC CHOLECYSTECTOMY;  Surgeon: Benjamine Sprague, DO;  Location: [facility] ORS;  Service: General;  Laterality: N/A;  . COLON RESECTION Right 11/06/2018   Procedure: HAND ASSISTED LAPAROSCOPIC COLON RESECTION, RIGHT;  Surgeon: Jules Husbands, MD;  Location: [facility] ORS;  Service: General;  Laterality: Right;  . COLONOSCOPY  2009,12/30/2013  . COLONOSCOPY WITH PROPOFOL N/A 04/16/2018   Tubulovillous polyp proximal transverse colon, large sessile polyp, proximal transverse colon;  Surgeon: Robert Bellow, MD;  Location: [facility] ENDOSCOPY;  Service: Endoscopy;  Laterality: N/A;  . ESOPHAGOGASTRODUODENOSCOPY (EGD) WITH PROPOFOL N/A 10/14/2018   Procedure: ESOPHAGOGASTRODUODENOSCOPY (EGD) WITH PROPOFOL;  Surgeon: Lucilla Lame, MD;  Location: [facility] ENDOSCOPY;  Service: Endoscopy;  Laterality: N/A;  . HYDROCELE EXCISION / REPAIR    . TRACHEAL SURGERY  2013  . TRACHEOSTOMY N/A    Dr Kathyrn Sheriff    Family History  Problem Relation Age of Onset  . Cancer Father        bone cancer  . Cancer Sister        lymphoma  . Cancer Brother   . Breast cancer Neg Hx     Social History:  reports that she has never smoked. She has never used smokeless tobacco. She reports that she does not  drink alcohol and does not use drugs.  She denies any smoking. She worked in [employer] (medical) for 26 years. She is married and her husband's name is [name].  She lives in [place]. The patient is alone today.   Allergies:  Allergies  Allergen Reactions  . Shellfish Allergy Shortness Of Breath    respitatory distress   . Sodium Sulfite Shortness Of Breath    Severe Congestion that creates inability to breath  . Sulfa Antibiotics Other (See Comments) and Shortness Of  Breath    Other reaction(s): OTHER Other reaction(s): Other (See Comments) respitatory distress  Other reaction(s): OTHER respitatory distress  Other reaction(s): OTHER Other reaction(s): Other (See Comments) respitatory distress  respitatory distress   . Codeine Other (See Comments)    Per patient "it kept her up all night"    Current Medications: Current Outpatient Medications  Medication Sig Dispense Refill  . busPIRone (BUSPAR) 30 MG tablet 1 tablet (30 mg total) by Per NG tube route 2 (two) times daily.    Marland Kitchen escitalopram (LEXAPRO) 10 MG tablet Place 3 tablets (30 mg total) into feeding tube daily.    . folic acid (FOLVITE) 1 MG tablet Place 1 tablet (1 mg total) into feeding tube daily.    Marland Kitchen gabapentin (NEURONTIN) 250 MG/5ML solution Place 12 mLs (600 mg total) into feeding tube every 8 (eight) hours.  12  . lovastatin (MEVACOR) 20 MG tablet Take 20 mg by mouth daily.    . meclizine (ANTIVERT) 25 MG tablet Take by mouth.    . melatonin 5 MG TABS Place 1 tablet (5 mg total) into feeding tube at bedtime.  0  . metFORMIN (GLUCOPHAGE) 1000 MG tablet Take 1 tablet by mouth daily.    . metoprolol tartrate (LOPRESSOR) 25 MG tablet Take 25 mg by mouth daily.    . pantoprazole (PROTONIX) 40 MG tablet Take 40 mg by mouth daily.    Marland Kitchen rOPINIRole (REQUIP) 0.5 MG tablet Place 1 tablet (0.5 mg total) into feeding tube at bedtime.     No current facility-administered medications for this visit.    Review of Systems  Constitutional: Positive for malaise/fatigue. Negative for chills, fever and weight loss.  HENT: Negative for congestion, ear pain and tinnitus.   Eyes: Negative.  Negative for blurred vision and double vision.  Respiratory: Positive for shortness of breath. Negative for cough and sputum production.   Cardiovascular: Negative.  Negative for chest pain, palpitations and leg swelling.  Gastrointestinal: Negative.  Negative for abdominal pain, constipation, diarrhea, nausea and  vomiting.  Genitourinary: Negative for dysuria, frequency and urgency.  Musculoskeletal: Negative for back pain and falls.  Skin: Negative.  Negative for rash.  Neurological: Positive for weakness. Negative for headaches.  Endo/Heme/Allergies: Negative.  Does not bruise/bleed easily.  Psychiatric/Behavioral: Negative for depression. The patient is nervous/anxious. The patient does not have insomnia.     Performance status (ECOG): 2  Vitals Blood pressure (!) 107/53, pulse 86, temperature 98 F (36.7 C), temperature source Tympanic, resp. rate 18, height 5' 4" (1.626 m), weight 159 lb 9.8 oz (72.4 kg), SpO2 96 %.   Physical Exam Vitals and nursing note reviewed.  Constitutional:      General: She is not in acute distress.    Appearance: She is well-developed. She is not diaphoretic.     Comments: Requires assistance onto exam table.  HENT:     Head: Normocephalic and atraumatic.     Comments: Short gray hair.    Mouth/Throat:  Mouth: Mucous membranes are moist.     Pharynx: Oropharynx is clear. No oropharyngeal exudate.  Eyes:     General: No scleral icterus.    Conjunctiva/sclera: Conjunctivae normal.     Pupils: Pupils are equal, round, and reactive to light.     Comments: Glasses.  Brown eyes.  Neck:     Comments: Tracheostomy. Cardiovascular:     Rate and Rhythm: Normal rate and regular rhythm.     Heart sounds: Normal heart sounds. No murmur heard.   Pulmonary:     Effort: Pulmonary effort is normal. No respiratory distress.     Breath sounds: Normal breath sounds. No wheezing or rales.  Chest:     Chest wall: No tenderness.  Breasts:     Right: Skin change (Small pea sized mass along lumpectomy incision at 11oclock) and tenderness present. No inverted nipple, mass, nipple discharge, axillary adenopathy or supraclavicular adenopathy.     Left: No inverted nipple, mass, nipple discharge, skin change, tenderness, axillary adenopathy or supraclavicular adenopathy.     Abdominal:     General: Bowel sounds are normal. There is no distension.     Palpations: Abdomen is soft. There is no mass.     Tenderness: There is no abdominal tenderness. There is no guarding or rebound.  Musculoskeletal:        General: No swelling or tenderness. Normal range of motion.     Cervical back: Normal range of motion and neck supple.     Right lower leg: No edema.     Left lower leg: No edema.  Lymphadenopathy:     Head:     Right side of head: No preauricular, posterior auricular or occipital adenopathy.     Left side of head: No preauricular, posterior auricular or occipital adenopathy.     Cervical: No cervical adenopathy.     Upper Body:     Right upper body: No supraclavicular or axillary adenopathy.     Left upper body: No supraclavicular or axillary adenopathy.     Lower Body: No right inguinal adenopathy. No left inguinal adenopathy.  Skin:    General: Skin is warm and dry.  Neurological:     Mental Status: She is alert and oriented to person, place, and time.  Psychiatric:        Behavior: Behavior normal.        Thought Content: Thought content normal.        Judgment: Judgment normal.    Appointment on 06/23/2020  Component Date Value Ref Range Status  . WBC 06/23/2020 10.4  4.0 - 10.5 K/uL Final  . RBC 06/23/2020 4.93  3.87 - 5.11 MIL/uL Final  . Hemoglobin 06/23/2020 13.1  12.0 - 15.0 g/dL Final  . HCT 06/23/2020 40.6  36.0 - 46.0 % Final  . MCV 06/23/2020 82.4  80.0 - 100.0 fL Final  . MCH 06/23/2020 26.6  26.0 - 34.0 pg Final  . MCHC 06/23/2020 32.3  30.0 - 36.0 g/dL Final  . RDW 06/23/2020 14.3  11.5 - 15.5 % Final  . Platelets 06/23/2020 236  150 - 400 K/uL Final  . nRBC 06/23/2020 0.0  0.0 - 0.2 % Final  . Neutrophils Relative % 06/23/2020 76  % Final  . Neutro Abs 06/23/2020 7.9* 1.7 - 7.7 K/uL Final  . Lymphocytes Relative 06/23/2020 16  % Final  . Lymphs Abs 06/23/2020 1.7  0.7 - 4.0 K/uL Final  . Monocytes Relative 06/23/2020 5  %  Final  .  Monocytes Absolute 06/23/2020 0.5  0.1 - 1.0 K/uL Final  . Eosinophils Relative 06/23/2020 1  % Final  . Eosinophils Absolute 06/23/2020 0.2  0.0 - 0.5 K/uL Final  . Basophils Relative 06/23/2020 1  % Final  . Basophils Absolute 06/23/2020 0.1  0.0 - 0.1 K/uL Final  . Immature Granulocytes 06/23/2020 1  % Final  . Abs Immature Granulocytes 06/23/2020 0.07  0.00 - 0.07 K/uL Final   Performed at [facility], [address], [place], [place] [address]    Assessment:  Angela David is a 80 y.o. female with stage II invasive right breast cancer s/p lumpectomy with sentinel lymph node biopsy on 08/14/2000.  Pathology revealed a 1.0 to 1.5 cm grade II IDC.  There was evidence of lymphatic invasion.  Margins were clear.  Tumor was ER-, PR+, and Her2/neu -. Subsequent SLN and axillary dissection confirmed metastatic carcinoma in 1 lymph node (pathology unavailable).  Pathologic stage was T1cN1M0.    She received epirubicin and cyclophosphamide (EC) for 4 cycles (09/2000 - 01/2001).  She received radiation in 02/2001 - 03/2001. She received tamoxifen for five years (ended ~ 02/2006).   Bilateral mammogram on 01/29/2018 revealed no evidence of malignancy.  Bilateral screening mammogram on 04/08/2019 showed no evidence of malignancy.   CEA has been followed:  11.4 on 12/21/2015, 8.1 on 02/22/2016, 7.2 on 06/29/2016, 10.1 on 01/15/2017, 11.3 on 09/03/2017, 9.4 on 03/04/2018, 9.6 on 09/03/2018, 11.4 on 05/18/2019, and 12.3 on 12/08/2019.  CA27.29 has been followed:  37.5 on 12/21/2015, 31.4 on 06/30/2016, 29.5 on 01/04/2017, 24.4 on 09/03/2017, 27.8 on 03/04/2018, 27.8 on 03/04/2018, 32.7 on 05/18/2019, and 23.3 on 12/08/2019.  PET scan on 09/16/2018 revealed no evidence of metabolically active malignancy or metastatic disease.   Bone density on 03/25/2014 revealed osteopenia with a T-score of -1.4 in the left femoral neck.  Bone density on 04/09/2018 revealed osteopenia  with a T-score of -1.4 in the right femoral neck.  She has received Prolia every 6 months (01/18/2016 - 05/18/2019).  Colonoscopy on 04/16/2018 revealed two 6 to 12 mm polyps in the transverse colon (removed), one 45 mm polyp in the proximal transverse colon, and one 7 mm polyp at the recto-sigmoid colon (removed).  Treatment for the larger 4.5 cm polyp in the transverse colon was unsuccessful.  The lesion was tatooed.  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  There was no dysplasia or malignancy.    She underwent a laparoscopic right hemicolectomy with stapled ileocolostomy on 11/06/2018.  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 There were 25 lymph nodes negative for malignancy. Surgical margins were free of of dysplasia  She is s/p tracheostomy for tracheal stenosis.   She is status post peg tube placement on 01/28/2021 after being hospitalized for aspiration pneumonia.  Requires daily tube feeds.  Symptomatically, she feels scared d/t knew breast mass. She states she has had several spots biopsied on her right breast in the past which turned out to be fibrocystic in nature but feels this 1 is different.  Plan: 1.   Labs today: CBC with diff, CMP, CA27.29, CEA. 2.   Stage II right breast cancer             Clinically, she is doing fair.  Exam reveals small pea-sized mass on lumpectomy or previous biopsy site.  She is past due for mammogram.   Her  last mammogram was on 04/09/2019.             She is s/p lumpectomy, adjuvant chemotherapy and radiation.  CA 27.29 is normal.  Will reschedule bilateral mammogram with ultrasound STAT.   This is scheduled for 06/27/20.  We will call her with results. 3.   Elevated CEA             Patient has had an elevated CEA since 11/2015.  CEA has ranged between 7.2 - 11.4.  Etiology remains  uncertain.             PET scan on 09/16/2018 revealed no evidence of metabolically active disease.  Follow-up CEA.  If repeat is > 15% above baseline, will repeat imaging. 4.   Osteopenia             Bone density on 04/09/2018 revealed osteopenia.             She receives Prolia every 6 months (last 05/18/2019).  Prolia today. 5.   Transverse colon polyp             Patient is s/p right laparoscopic right hemicolectomy on 11/06/2018.  Pathology reviewed- tubular adenoma. 6.  Left breast intertriginous dermatitis:  Chronic but stable.  Rx nystatin powder sent to pharmacy.  Disposition: Proceed with Prolia today. Stat Bilateral mammogram with ultrasound of right axilla. Will call with results. RTC in 6 months for MD assessment, labs (CBC with diff, CMP, CA27.29) and Prolia.  Plan  I discussed the assessment and treatment plan with the patient.  The patient was provided an opportunity to ask questions and all were answered.  The patient agreed with the plan and demonstrated an understanding of the instructions.  The patient was advised to call back if the symptoms worsen or if the condition fails to improve as anticipated.  Greater than 50% was spent in counseling and coordination of care with this patient including but not limited to discussion of the relevant topics above (See A&P) including, but not limited to diagnosis and management of acute and chronic medical conditions.   Faythe Casa, NP 06/26/2020 4:04 PM

## 2020-06-24 LAB — CANCER ANTIGEN 27.29: CA 27.29: 28.4 U/mL (ref 0.0–38.6)

## 2020-06-27 ENCOUNTER — Ambulatory Visit
Admission: RE | Admit: 2020-06-27 | Discharge: 2020-06-27 | Disposition: A | Discharge status: Home / Self Care | Payer: Medicare Other | Source: Ambulatory Visit | Attending: Oncology | Admitting: Oncology

## 2020-06-27 ENCOUNTER — Other Ambulatory Visit: Payer: Self-pay

## 2020-06-27 ENCOUNTER — Other Ambulatory Visit: Payer: Medicare Other

## 2020-06-27 ENCOUNTER — Other Ambulatory Visit: Payer: Self-pay | Admitting: Oncology

## 2020-06-27 DIAGNOSIS — Z171 Estrogen receptor negative status [ER-]: Secondary | ICD-10-CM

## 2020-06-27 DIAGNOSIS — C50811 Malignant neoplasm of overlapping sites of right female breast: Secondary | ICD-10-CM | POA: Diagnosis present

## 2020-06-27 DIAGNOSIS — C50011 Malignant neoplasm of nipple and areola, right female breast: Secondary | ICD-10-CM

## 2020-06-27 DIAGNOSIS — R928 Other abnormal and inconclusive findings on diagnostic imaging of breast: Secondary | ICD-10-CM

## 2020-06-27 DIAGNOSIS — N6311 Unspecified lump in the right breast, upper outer quadrant: Secondary | ICD-10-CM

## 2020-06-27 DIAGNOSIS — N6310 Unspecified lump in the right breast, unspecified quadrant: Secondary | ICD-10-CM

## 2020-06-30 ENCOUNTER — Ambulatory Visit
Admission: RE | Admit: 2020-06-30 | Discharge: 2020-06-30 | Disposition: A | Discharge status: Home / Self Care | Payer: Medicare Other | Source: Ambulatory Visit | Attending: Oncology | Admitting: Oncology

## 2020-06-30 ENCOUNTER — Other Ambulatory Visit: Payer: Self-pay

## 2020-06-30 DIAGNOSIS — Z853 Personal history of malignant neoplasm of breast: Secondary | ICD-10-CM | POA: Diagnosis not present

## 2020-06-30 DIAGNOSIS — N6310 Unspecified lump in the right breast, unspecified quadrant: Secondary | ICD-10-CM

## 2020-06-30 DIAGNOSIS — R928 Other abnormal and inconclusive findings on diagnostic imaging of breast: Secondary | ICD-10-CM | POA: Diagnosis present

## 2020-06-30 DIAGNOSIS — D249 Benign neoplasm of unspecified breast: Secondary | ICD-10-CM | POA: Diagnosis not present

## 2020-06-30 HISTORY — PX: BREAST BIOPSY: SHX20

## 2020-07-01 LAB — SURGICAL PATHOLOGY

## 2020-07-02 NOTE — Progress Notes (Signed)
Carlisle Beers, would you mind calling Angela David and let her know that her biopsy of her right breast was negative for recurrence of her breast cancer.  Keep all of her scheduled appointments and we do not need to add anything additional at this time.  Thank you.  Rulon Abide

## 2020-07-04 ENCOUNTER — Telehealth: Payer: Self-pay

## 2020-07-04 NOTE — Telephone Encounter (Signed)
Have tried contacting pt to relay results that her biopsy of her rt breast was negative for recurrence of her breast cancer. Keep all of her updated scheduled appointments and we do not need to add anything additional at this time. Pt VM is full and not able to leave message. Will try again later this afternoon.

## 2020-07-05 ENCOUNTER — Encounter: Payer: Self-pay | Admitting: Neurology

## 2020-07-05 ENCOUNTER — Ambulatory Visit (INDEPENDENT_AMBULATORY_CARE_PROVIDER_SITE_OTHER): Payer: Medicare Other | Admitting: Neurology

## 2020-07-05 ENCOUNTER — Telehealth: Payer: Self-pay

## 2020-07-05 ENCOUNTER — Encounter: Payer: Self-pay | Admitting: *Deleted

## 2020-07-05 DIAGNOSIS — R29898 Other symptoms and signs involving the musculoskeletal system: Secondary | ICD-10-CM

## 2020-07-05 DIAGNOSIS — M6281 Muscle weakness (generalized): Secondary | ICD-10-CM | POA: Diagnosis not present

## 2020-07-05 NOTE — Progress Notes (Signed)
Received message from Electa Sniff, RN that patient had benign breast biopsy, but the radiologist has recommended surgical consult for discordant imaging and biopsy results.  Tried to call patient, but no answer and no voicemail.  Will try again later.

## 2020-07-05 NOTE — Progress Notes (Signed)
Please refer to EMG and nerve conduction procedure note.  

## 2020-07-05 NOTE — Procedures (Signed)
HISTORY:  Angela David is a 80 year old patient with history of external ophthalmoplegia who has had some problems with weakness of the arms and legs.  She has difficulty walking because of this.  She is being evaluated for her proximal muscle weakness.  She does have a history of a peripheral neuropathy.   NERVE CONDUCTION STUDIES:  Nerve conduction studies were performed on the left upper extremity.  The distal motor latencies for the median and ulnar nerves were normal with a low motor amplitude for the left median nerve, normal for the ulnar nerve.  The nerve conduction velocities for these nerves were normal.  The sensory latency for the left median nerve was prolonged, normal for the left ulnar nerve.  The left ulnar F-wave latency was slightly prolonged.  Nerve conduction studies were performed on the left lower extremity.  The study of the left peroneal nerve was unobtainable.  The distal motor latency for the left posterior tibial nerve was within normal limits but with a very low motor amplitude.  Significant slowing for the left posterior tibial nerve was seen.  No response was seen for the left sural or left peroneal sensory latencies.  The F-wave latency for the left posterior tibial nerve was significantly prolonged.  EMG STUDIES:  EMG study performed on the left upper extremity:  The first dorsal interosseous muscle reveals 2 to 4 K units with full recruitment. No fibrillations or positive waves were noted. The abductor pollicis brevis muscle reveals 2 to 4 K units with full recruitment. No fibrillations or positive waves were noted. The extensor indicis proprius muscle reveals 1 to 3 K units with full recruitment. No fibrillations or positive waves were noted. The biceps muscle reveals 0.5 to 1 K units with full recruitment. No fibrillations or positive waves were noted. The triceps muscle reveals 1 to 2 K units with full recruitment. No fibrillations or positive waves were  noted. The anterior deltoid muscle reveals 1 to 2 K units with full recruitment. No fibrillations or positive waves were noted. The cervical paraspinal muscles were tested at 2 levels. No abnormalities of insertional activity were seen at either level tested. There was fair relaxation.  EMG study was performed on the left lower extremity:  The tibialis anterior muscle reveals 2 to 4K motor units with slightly decreased recruitment. No fibrillations or positive waves were seen. The peroneus tertius muscle reveals 2 to 4K motor units with slightly decreased recruitment. No fibrillations or positive waves were seen. The medial gastrocnemius muscle reveals 1 to 5K motor units with decreased recruitment. No fibrillations or positive waves were seen. The vastus lateralis muscle reveals 2 to 4K motor units with full recruitment. No fibrillations or positive waves were seen. The iliopsoas muscle reveals 2 to 4K motor units with full recruitment. No fibrillations or positive waves were seen. The biceps femoris muscle (long head) reveals 2 to 4K motor units with full recruitment. No fibrillations or positive waves were seen. The lumbosacral paraspinal muscles were tested at 3 levels, and revealed no abnormalities of insertional activity at all 3 levels tested. There was fair relaxation.   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  EMG evaluation of the left lower extremity shows minimal neuropathic changes distally, no myopathic abnormalities were seen.  EMG of the left upper extremity shows very subtle myopathic changes in the proximal muscles tested.  No  evidence of a lumbar or cervical radiculopathy was seen.  Jill Alexanders MD 07/05/2020 2:00 PM  [facility] [address] [place], [place] [address]  Phone [phone] Fax  [phone]

## 2020-07-05 NOTE — Progress Notes (Addendum)
The patient comes in today for EMG and nerve conduction study evaluation.  Nerve conduction studies do show evidence of a peripheral neuropathy with axonal and demyelinating features.  EMG of the left lower extremity was relatively unremarkable without myopathic changes but the EMG of the left upper extremity shows very subtle myopathic changes in the proximal muscles.  The patient has an external ophthalmoplegia, we talked about potentially getting genetic testing for mitochondrial gene mutations that can cause PEO.  We have decided to enter into muscle strengthening exercises first, to see if this helps the muscle weakness.  If not, we can do genetic testing in the future.    [place]    Nerve / Sites Muscle Latency Ref. Amplitude Ref. Rel Amp Segments Distance Velocity Ref. Area    ms ms mV mV %  cm m/s m/s mVms  L Median - APB     Wrist APB 3.9 ?4.4 3.7 ?4.0 100 Wrist - APB 7   15.3     Upper arm APB 8.2  3.3  88.4 Upper arm - Wrist 21 49 ?49 12.7  L Ulnar - ADM     Wrist ADM 3.1 ?3.3 6.6 ?6.0 100 Wrist - ADM 7   13.7     B.Elbow ADM 6.9  5.8  88.7 B.Elbow - Wrist 19 50 ?49 13.2     A.Elbow ADM 8.9  5.6  96 A.Elbow - B.Elbow 10 50 ?49 12.0  L Peroneal - EDB     Ankle EDB NR ?6.5 NR ?2.0 NR Ankle - EDB 9   NR     Fib head EDB NR  NR  NR Fib head - Ankle 25 NR ?44 NR     Pop fossa EDB NR  NR  NR Pop fossa - Fib head 10 NR ?44 NR         Pop fossa - Ankle      L Tibial - AH     Ankle AH 3.8 ?5.8 0.7 ?4.0 100 Ankle - AH 9   2.2     Pop fossa AH 17.4  0.7  101 Pop fossa - Ankle 36 27 ?41 2.3             SNC    Nerve / Sites Rec. Site Peak Lat Ref.  Amp Ref. Segments Distance    ms ms V V  cm  L Sural - Ankle (Calf)     Calf Ankle NR ?4.4 NR ?6 Calf - Ankle 14  L Superficial peroneal - Ankle     Lat leg Ankle NR ?4.4 NR ?6 Lat leg - Ankle 14  L Median - Orthodromic (Dig II, Mid palm)     Dig II Wrist 3.7 ?3.4 10 ?10 Dig II - Wrist 13  L Ulnar - Orthodromic, (Dig V, Mid palm)     Dig  V Wrist 3.0 ?3.1 5 ?5 Dig V - Wrist 76             F  Wave    Nerve F Lat Ref.   ms ms  L Tibial - AH 120.0 ?56.0  L Ulnar - ADM 33.0 ?32.0

## 2020-07-05 NOTE — Telephone Encounter (Signed)
Tried contacting pt to make aware of NP's decision. VM is full and not able to contact pt.

## 2020-07-06 ENCOUNTER — Encounter: Payer: Self-pay | Admitting: *Deleted

## 2020-07-06 NOTE — Progress Notes (Signed)
Patient has been scheduled to see Dr. Dahlia Byes per her request on May 16th at 3:30 for surgical consult for discordant right breast biopsy.

## 2020-07-07 ENCOUNTER — Telehealth: Payer: Self-pay

## 2020-07-07 NOTE — Telephone Encounter (Signed)
Informed pt biopsy of rt breast was negative for recurrence of breast cancer. Keep all of her scheudled appointments and we do not need to add anything at this time per [place]. Pt understood results and we went over future appt details. Pt understood and is satisfied.

## 2020-07-11 ENCOUNTER — Ambulatory Visit (INDEPENDENT_AMBULATORY_CARE_PROVIDER_SITE_OTHER): Payer: Medicare Other | Admitting: Surgery

## 2020-07-11 ENCOUNTER — Encounter: Payer: Self-pay | Admitting: Surgery

## 2020-07-11 ENCOUNTER — Other Ambulatory Visit: Payer: Self-pay

## 2020-07-11 ENCOUNTER — Telehealth: Payer: Self-pay

## 2020-07-11 VITALS — BP 114/65 | HR 77 | Temp 98.9°F | Ht 64.0 in | Wt 162.0 lb

## 2020-07-11 DIAGNOSIS — N6310 Unspecified lump in the right breast, unspecified quadrant: Secondary | ICD-10-CM

## 2020-07-11 DIAGNOSIS — K431 Incisional hernia with gangrene: Secondary | ICD-10-CM

## 2020-07-11 NOTE — Telephone Encounter (Signed)
Faxed pulmonology clearance to Dr. Flora Lipps @ [phone].

## 2020-07-11 NOTE — Patient Instructions (Addendum)
Our surgery scheduler Pamala Hurry will call you within 24-48 hours to get you scheduled. If you have not heard from her after 48 hours, please call our office. You will not need to get Covid tested before surgery and have the blue sheet available when she calls to write down important information.  If you have any concerns or questions, please feel free to call our office.    Laparoscopic Ventral Hernia Repair, Care After The following information offers guidance on how to care for yourself after your procedure. Your health care provider may also give you more specific instructions. If you have problems or questions, contact your health care provider. What can I expect after the procedure? After the procedure, it is common to have pain, discomfort, or soreness. Follow these instructions at home: Medicines  Take over-the-counter and prescription medicines only as told by your health care provider.  Ask your health care provider if the medicine prescribed to you: ? Requires you to avoid driving or using machinery. ? Can cause constipation. You may need to take these actions to prevent or treat constipation:  Drink enough fluid to keep your urine pale yellow.  Take over-the-counter or prescription medicines.  Eat foods that are high in fiber, such as beans, whole grains, and fresh fruits and vegetables.  Limit foods that are high in fat and processed sugars, such as fried or sweet foods. Incision care  Follow instructions from your health care provider about how to take care of your incisions. Make sure you: ? Wash your hands with soap and water for at least 20 seconds before and after you change your bandage (dressing) or before you touch your abdomen. If soap and water are not available, use hand sanitizer. ? Change your dressing as told by your health care provider. ? Leave stitches (sutures), skin glue, or adhesive strips in place. These skin closures may need to stay in place for 2 weeks  or longer. If adhesive strip edges start to loosen and curl up, you may trim the loose edges. Do not remove adhesive strips completely unless your health care provider tells you to do that.  Check your incision areas every day for signs of infection. Check for: ? More redness, swelling, or pain. ? Fluid or blood. ? Warmth. ? Pus or a bad smell.   Bathing  Do not take baths, swim, or use a hot tub until your health care provider approves. Ask your health care provider if you may take showers. You may only be allowed to take sponge baths.  Keep your dressing dry until your health care provider says it can be removed.   Activity  Rest as told by your health care provider.  Avoid sitting for a long time without moving. Get up to take short walks every 1-2 hours. This is important to improve blood flow and breathing. Ask for help if you feel weak or unsteady.  Do not lift anything that is heavier than 10 lb (4.5 kg), or the limit that you are told, until your health care provider says that it is safe.  If you were given a sedative during the procedure, it can affect you for several hours. Do not drive or operate machinery until your health care provider says that it is safe.  Return to your normal activities as told by your health care provider. Ask your health care provider what activities are safe for you.   General instructions  Hold a pillow over your abdomen when you cough  or sneeze. This helps with pain.  Do not use any products that contain nicotine or tobacco. These products include cigarettes, chewing tobacco, and vaping devices, such as e-cigarettes. These can delay healing after surgery. If you need help quitting, ask your health care provider.  You may be asked to continue to do deep breathing exercises at home. This will help to prevent a lung infection.  Keep all follow-up visits. This is important.   Contact a health care provider if:  You have any of these signs of  infection: ? More redness, swelling, or pain around an incision. ? Fluid or blood coming from an incision. ? Warmth coming from an incision. ? Pus or a bad smell coming from an incision. ? A fever or chills.  You have pain that gets worse or does not get better with medicine.  You have nausea or vomiting.  You have a cough.  You have shortness of breath.  You have not had a bowel movement in 3 days.  You are not able to urinate. Get help right away if you have:  Severe pain in your abdomen.  Persistent nausea and vomiting.  Redness, warmth, or pain in your leg.  Chest pain.  Trouble breathing. These symptoms may represent a serious problem that is an emergency. Do not wait to see if the symptoms will go away. Get medical help right away. Call your local emergency services (911 in the [place] Do not drive yourself to the hospital. Summary  After this procedure, it is common to have pain, discomfort, or soreness.  Follow instructions from your health care provider about how to take care of your incision.  Check your incision area every day for signs of infection. Report any signs of infection to your health care provider.  Keep all follow-up visits. This is important. This information is not intended to replace advice given to you by your health care provider. Make sure you discuss any questions you have with your health care provider. Document Revised: 10/02/2019 Document Reviewed: 10/02/2019 Elsevier Patient Education  2021 [employer].

## 2020-07-12 ENCOUNTER — Encounter: Payer: Self-pay | Admitting: Surgery

## 2020-07-12 ENCOUNTER — Telehealth: Payer: Self-pay

## 2020-07-12 ENCOUNTER — Telehealth: Payer: Self-pay | Admitting: Surgery

## 2020-07-12 NOTE — Progress Notes (Signed)
Outpatient Surgical Follow Up  07/12/2020  Angela David is an 80 y.o. female.   Chief Complaint  Patient presents with  . Follow-up    R breast biopsy 06/30/20    HPI: Angela David is a 80 year old female well-known to me with prior history of neck respiratory failure with a tracheostomy most recently failure to thrive and hospitalized for hypoxia requiring ventilatory support.  At that time she required robotic G-tube placement and she has done very well with that.  Now comes with some right-sided breast lump.  She did have a history of breast cancer on the right side and status post right lumpectomy with radiation therapy.  Most recent mammogram personally reviewed showing an area at 10:00 and this was biopsied showing benign tissue.  Past Medical History:  Diagnosis Date  . Breast cancer ([place]) 2002   right breast chemo and radiation  . Cancer [facility]) 2002   right breast  . Diabetes mellitus without complication ([place])   . Diabetic peripheral neuropathy ([place]) 08/28/2016  . GERD (gastroesophageal reflux disease)   . Glaucoma   . High cholesterol   . History of colon polyps   . Peripheral neuropathy 08/28/2016  . Personal history of chemotherapy 2002   BREAST CA  . Personal history of malignant neoplasm of breast   . Personal history of radiation therapy 2002   BREAST CA  . Vocal cord paralysis 2012    Past Surgical History:  Procedure Laterality Date  . ABDOMINAL HYSTERECTOMY    . BREAST BIOPSY Right 2008   neg  . BREAST BIOPSY Right 06/30/2020   "Q" clip-path pending  . BREAST EXCISIONAL BIOPSY Right 2002   positive  . BREAST LUMPECTOMY Right 2002   chemo and radiation  . BREAST SURGERY Right 2002   LUMPECTOMY, radiation, chemotherapy  . CHOLECYSTECTOMY N/A 12/10/2017   Procedure: LAPAROSCOPIC CHOLECYSTECTOMY;  Surgeon: Benjamine Sprague, DO;  Location: [facility] ORS;  Service: General;  Laterality: N/A;  . COLON RESECTION Right 11/06/2018   Procedure: HAND ASSISTED LAPAROSCOPIC  COLON RESECTION, RIGHT;  Surgeon: Jules Husbands, MD;  Location: [facility] ORS;  Service: General;  Laterality: Right;  . COLONOSCOPY  2009,12/30/2013  . COLONOSCOPY WITH PROPOFOL N/A 04/16/2018   Tubulovillous polyp proximal transverse colon, large sessile polyp, proximal transverse colon;  Surgeon: Robert Bellow, MD;  Location: [facility] ENDOSCOPY;  Service: Endoscopy;  Laterality: N/A;  . ESOPHAGOGASTRODUODENOSCOPY (EGD) WITH PROPOFOL N/A 10/14/2018   Procedure: ESOPHAGOGASTRODUODENOSCOPY (EGD) WITH PROPOFOL;  Surgeon: Lucilla Lame, MD;  Location: [facility] ENDOSCOPY;  Service: Endoscopy;  Laterality: N/A;  . HYDROCELE EXCISION / REPAIR    . TRACHEAL SURGERY  2013  . TRACHEOSTOMY N/A    Dr Kathyrn Sheriff    Family History  Problem Relation Age of Onset  . Cancer Father        bone cancer  . Cancer Sister        lymphoma  . Cancer Brother   . Breast cancer Neg Hx     Social History:  reports that she has never smoked. She has never used smokeless tobacco. She reports that she does not drink alcohol and does not use drugs.  Allergies:  Allergies  Allergen Reactions  . Shellfish Allergy Shortness Of Breath    respitatory distress   . Sodium Sulfite Shortness Of Breath    Severe Congestion that creates inability to breath  . Sulfa Antibiotics Other (See Comments) and Shortness Of Breath    Other reaction(s): OTHER Other reaction(s): Other (See Comments) respitatory  distress  Other reaction(s): OTHER respitatory distress  Other reaction(s): OTHER Other reaction(s): Other (See Comments) respitatory distress  respitatory distress   . Codeine Other (See Comments)    Per patient "it kept her up all night"    Medications reviewed.    ROS Full ROS performed and is otherwise negative other than what is stated in HPI   BP 114/65   Pulse 77   Temp 98.9 F (37.2 C) (Oral)   Ht 5\' 4"  (1.626 m)   Wt 162 lb (73.5 kg)   SpO2 96%   BMI 27.81 kg/m   Physical Exam Vitals and nursing note  reviewed. Exam conducted with a chaperone present.  Constitutional:      General: She is not in acute distress.    Appearance: Normal appearance. She is normal weight.  Neck:     Comments: Trach in place Cardiovascular:     Rate and Rhythm: Normal rate and regular rhythm.  Pulmonary:     Effort: Pulmonary effort is normal. No respiratory distress.     Breath sounds: Normal breath sounds. No stridor. No wheezing or rhonchi.     Comments: Breast: right lumpectomy scar, Right breast parenchyma c/w radiation changes, no definitive masses. No LAD on either side. Abdominal:     General: Abdomen is flat. There is no distension.     Palpations: Abdomen is soft. There is no mass.     Tenderness: There is no abdominal tenderness. There is no rebound.     Hernia: A hernia is present.  Musculoskeletal:        General: No swelling. Normal range of motion.     Cervical back: Normal range of motion.  Skin:    General: Skin is warm and dry.     Capillary Refill: Capillary refill takes less than 2 seconds.  Neurological:     General: No focal deficit present.     Mental Status: She is alert and oriented to person, place, and time.  Psychiatric:        Mood and Affect: Mood normal.        Behavior: Behavior normal.        Thought Content: Thought content normal.        Judgment: Judgment normal.         Assessment/Plan: 80 year old female with history of history of right breast cancer in 2002 status postlumpectomy and radiation.  On her right breast changes are related to radiation .  Discussed with the patient detail about my thought process.  She does have a discrepancy on her  Pathology and may require formal excision.  Although physical exam it is not classical for any malignancies and I feel that this is likely related to radiation changes. Surgical perspective I will have a little bit of a harder time trying to delineate the right breast mass.  Discussed with the patient in detail.  She  wishes to hold off any intervention at this time.  Is to recover from a pulmonary perspective and we will see her back in about 3 to 6 months.  If she changes her mind we might be able to potentially address both issues of the right breast and the incisional hernia.   Extensive counseling provided.  Greater than 50% of the 45 minutes  visit was spent in counseling/coordination of care   Caroleen Hamman, MD [facility]

## 2020-07-12 NOTE — Telephone Encounter (Signed)
Pt called and wishes to cancel surgery for 07/21/20 with Dr. Dahlia Byes. Pt states she will call back if she wants the surgery in the future. No pulmonary clearance is needed at this time.

## 2020-07-12 NOTE — Telephone Encounter (Signed)
Incoming call from patient.  He has cancelled her surgery for 07/21/20.  Does not wish to reschedule at this time.  She is having other things in her life going on now and just does not feel if she wants to proceed with this surgery.  If she changes her mind, will call us back to get rescheduled.

## 2020-07-15 ENCOUNTER — Other Ambulatory Visit: Payer: Medicare Other

## 2020-07-18 ENCOUNTER — Telehealth: Payer: Self-pay | Admitting: Internal Medicine

## 2020-07-18 NOTE — Telephone Encounter (Signed)
Surgical clearance form has been completed by Dr. Mortimer Fries and faxed to [facility].  Patient last seen 06/01/2020

## 2020-07-18 NOTE — Progress Notes (Signed)
Pulmonary Clearance has been received from Dr Flora Lipps. The patient is optimized for surgery and cleared at Medium risk.

## 2020-07-21 ENCOUNTER — Ambulatory Visit: Admission: RE | Admit: 2020-07-21 | Payer: Medicare Other | Source: Home / Self Care | Admitting: Surgery

## 2020-07-21 ENCOUNTER — Encounter: Admission: RE | Payer: Self-pay | Source: Home / Self Care

## 2020-07-21 SURGERY — REPAIR, HERNIA, VENTRAL, ROBOT-ASSISTED
Anesthesia: General

## 2020-09-07 ENCOUNTER — Telehealth: Payer: Self-pay | Admitting: Neurology

## 2020-09-07 NOTE — Telephone Encounter (Signed)
Pt's husband, Heath Gold (on [place]) called, asking referral for physical therapy be sent to [facility]. Ph# [phone] Fax# [phone]. Would like a call from the nurse.

## 2020-09-08 NOTE — Telephone Encounter (Signed)
Referral sent to [facility]. Phone: [phone].

## 2020-09-26 ENCOUNTER — Ambulatory Visit (INDEPENDENT_AMBULATORY_CARE_PROVIDER_SITE_OTHER): Payer: Medicare Other | Admitting: Neurology

## 2020-09-26 ENCOUNTER — Encounter: Payer: Self-pay | Admitting: Neurology

## 2020-09-26 VITALS — BP 95/58 | HR 76 | Ht 64.0 in | Wt 158.0 lb

## 2020-09-26 DIAGNOSIS — E1142 Type 2 diabetes mellitus with diabetic polyneuropathy: Secondary | ICD-10-CM | POA: Diagnosis not present

## 2020-09-26 DIAGNOSIS — R269 Unspecified abnormalities of gait and mobility: Secondary | ICD-10-CM | POA: Diagnosis not present

## 2020-09-26 NOTE — Progress Notes (Signed)
Reason for visit: Generalized weakness, gait disturbance, external ophthalmoplegia  Angela David is an 80 y.o. female  History of present illness:  Angela David is a 80 year old right-handed white female with a history of problems with generalized weakness, difficulty with fatigue of the legs with walking.  She reports no recent falls, she comes in a wheelchair today.  She has had problems with breathing, she has required a tracheostomy.  She has undergone EMG and nerve conduction study evaluation that does show evidence of a neuropathy possibly associated with diabetes.  No clear evidence of myopathic disorder was seen.  The patient has been set up for physical therapy but is just now getting started with this.  She returns to the office today for an evaluation.  She has a lifelong history of restriction of eye movements, no ptosis is seen.  Past Medical History:  Diagnosis Date   Breast cancer ([place]) 2002   right breast chemo and radiation   Cancer [facility]) 2002   right breast   Diabetes mellitus without complication ([place])    Diabetic peripheral neuropathy ([place]) 08/28/2016   GERD (gastroesophageal reflux disease)    Glaucoma    High cholesterol    History of colon polyps    Peripheral neuropathy 08/28/2016   Personal history of chemotherapy 2002   BREAST CA   Personal history of malignant neoplasm of breast    Personal history of radiation therapy 2002   BREAST CA   Vocal cord paralysis 2012    Past Surgical History:  Procedure Laterality Date   ABDOMINAL HYSTERECTOMY     BREAST BIOPSY Right 2008   neg   BREAST BIOPSY Right 06/30/2020   "Q" clip-path pending   BREAST EXCISIONAL BIOPSY Right 2002   positive   BREAST LUMPECTOMY Right 2002   chemo and radiation   BREAST SURGERY Right 2002   LUMPECTOMY, radiation, chemotherapy   CHOLECYSTECTOMY N/A 12/10/2017   Procedure: LAPAROSCOPIC CHOLECYSTECTOMY;  Surgeon: Benjamine Sprague, DO;  Location: [facility] ORS;  Service: General;   Laterality: N/A;   COLON RESECTION Right 11/06/2018   Procedure: HAND ASSISTED LAPAROSCOPIC COLON RESECTION, RIGHT;  Surgeon: Jules Husbands, MD;  Location: [facility] ORS;  Service: General;  Laterality: Right;   COLONOSCOPY  2009,12/30/2013   COLONOSCOPY WITH PROPOFOL N/A 04/16/2018   Tubulovillous polyp proximal transverse colon, large sessile polyp, proximal transverse colon;  Surgeon: Robert Bellow, MD;  Location: [facility] ENDOSCOPY;  Service: Endoscopy;  Laterality: N/A;   ESOPHAGOGASTRODUODENOSCOPY (EGD) WITH PROPOFOL N/A 10/14/2018   Procedure: ESOPHAGOGASTRODUODENOSCOPY (EGD) WITH PROPOFOL;  Surgeon: Lucilla Lame, MD;  Location: [facility] ENDOSCOPY;  Service: Endoscopy;  Laterality: N/A;   HYDROCELE EXCISION / REPAIR     TRACHEAL SURGERY  2013   TRACHEOSTOMY N/A    Dr Kathyrn Sheriff    Family History  Problem Relation Age of Onset   Cancer Father        bone cancer   Cancer Sister        lymphoma   Cancer Brother    Breast cancer Neg Hx     Social history:  reports that she has never smoked. She has never used smokeless tobacco. She reports that she does not drink alcohol and does not use drugs.    Allergies  Allergen Reactions   Shellfish Allergy Shortness Of Breath    respitatory distress    Sodium Sulfite Shortness Of Breath    Severe Congestion that creates inability to breath   Sulfa Antibiotics Other (See Comments)  and Shortness Of Breath    Other reaction(s): OTHER Other reaction(s): Other (See Comments) respitatory distress  Other reaction(s): OTHER respitatory distress  Other reaction(s): OTHER Other reaction(s): Other (See Comments) respitatory distress  respitatory distress    Codeine Other (See Comments)    Per patient "it kept her up all night"    Medications:  Prior to Admission medications   Medication Sig Start Date End Date Taking? Authorizing Provider  busPIRone (BUSPAR) 30 MG tablet 1 tablet (30 mg total) by Per NG tube route 2 (two) times daily. 02/13/20  Yes  Kasa, Maretta Bees, MD  escitalopram (LEXAPRO) 10 MG tablet Place 3 tablets (30 mg total) into feeding tube daily. 02/13/20  Yes Flora Lipps, MD  folic acid (FOLVITE) 1 MG tablet Place 1 tablet (1 mg total) into feeding tube daily. 02/13/20  Yes Flora Lipps, MD  gabapentin (NEURONTIN) 250 MG/5ML solution Place 12 mLs (600 mg total) into feeding tube every 8 (eight) hours. 02/13/20  Yes Kasa, Maretta Bees, MD  lovastatin (MEVACOR) 20 MG tablet Take 20 mg by mouth daily. 06/13/20  Yes [provider]  meclizine (ANTIVERT) 25 MG tablet Take by mouth. 04/21/20  Yes [provider]  melatonin 5 MG TABS Place 1 tablet (5 mg total) into feeding tube at bedtime. 02/13/20  Yes Flora Lipps, MD  metFORMIN (GLUCOPHAGE) 1000 MG tablet Take 1 tablet by mouth daily. 06/10/20  Yes [provider]  metoprolol tartrate (LOPRESSOR) 25 MG tablet Take 25 mg by mouth daily. 06/13/20  Yes [provider]  nystatin (MYCOSTATIN/NYSTOP) powder Apply 1 application topically 3 (three) times daily. 06/23/20  Yes Burns, Wandra Feinstein, NP  pantoprazole (PROTONIX) 40 MG tablet Take 40 mg by mouth daily.   Yes [provider]  rOPINIRole (REQUIP) 0.5 MG tablet Place 1 tablet (0.5 mg total) into feeding tube at bedtime. 02/13/20  Yes Flora Lipps, MD    ROS:  Out of a complete 14 system review of symptoms, the patient complains only of the following symptoms, and all other reviewed systems are negative.  Weakness, walking difficulty Eye-movement paralysis Breathing problems, shortness of breath  Blood pressure (!) 95/58, pulse 76, height '5\' 4"'$  (1.626 m), weight 158 lb (71.7 kg).  Physical Exam  General: The patient is alert and cooperative at the time of the examination.  Skin: No significant peripheral edema is noted.   Neurologic Exam  Mental status: The patient is alert and oriented x 3 at the time of the examination. The patient has apparent normal recent and remote memory, with an  apparently normal attention span and concentration ability.   Cranial nerves: Facial symmetry is present. Speech is normal, no aphasia or dysarthria is noted. Extraocular movements are quite restricted in the horizontal plane, the patient is able to deviate eyes superiorly and inferiorly better, but was still restricted. Visual fields are full.  Motor: The patient has good strength in all 4 extremities.  No definite proximal weakness is seen on the arms and legs today.  Sensory examination: Soft touch sensation is symmetric on the face, arms, and legs.  Coordination: The patient has good finger-nose-finger and heel-to-shin bilaterally.  Gait and station: The patient is able to walk with the examiner, gait is somewhat wide-based.  Reflexes: Deep tendon reflexes are symmetric.   Assessment/Plan:  1.  Generalized weakness, gait disorder  2.  External ophthalmoplegia  We will investigate the possibility of genetic testing for mitochondrial disorder.  The patient has signed the [facility] lab form, we  will look into the cost of the blood panel.  The patient will continue with physical therapy, she will follow-up here in 6 months.  In the future, she can be seen through Dr. Krista Blue.  Jill Alexanders MD 09/26/2020 11:27 AM  [facility] [address] [place], [place] [address]  Phone [phone] Fax [phone]

## 2020-09-27 ENCOUNTER — Telehealth: Payer: Self-pay

## 2020-09-27 NOTE — Telephone Encounter (Signed)
Form received, signed, and faxed back to [id number]

## 2020-09-27 NOTE — Telephone Encounter (Signed)
Form received, signed and faxed back to [id number]

## 2020-10-01 ENCOUNTER — Emergency Department: Payer: Medicare Other

## 2020-10-01 ENCOUNTER — Emergency Department
Admission: EM | Admit: 2020-10-01 | Discharge: 2020-10-01 | Disposition: A | Discharge status: Home / Self Care | Payer: Medicare Other | Attending: Emergency Medicine | Admitting: Emergency Medicine

## 2020-10-01 ENCOUNTER — Other Ambulatory Visit: Payer: Self-pay

## 2020-10-01 ENCOUNTER — Encounter: Payer: Self-pay | Admitting: Emergency Medicine

## 2020-10-01 DIAGNOSIS — E1169 Type 2 diabetes mellitus with other specified complication: Secondary | ICD-10-CM | POA: Insufficient documentation

## 2020-10-01 DIAGNOSIS — Z853 Personal history of malignant neoplasm of breast: Secondary | ICD-10-CM | POA: Diagnosis not present

## 2020-10-01 DIAGNOSIS — E785 Hyperlipidemia, unspecified: Secondary | ICD-10-CM | POA: Diagnosis not present

## 2020-10-01 DIAGNOSIS — Z7984 Long term (current) use of oral hypoglycemic drugs: Secondary | ICD-10-CM | POA: Diagnosis not present

## 2020-10-01 DIAGNOSIS — Z79899 Other long term (current) drug therapy: Secondary | ICD-10-CM | POA: Insufficient documentation

## 2020-10-01 DIAGNOSIS — K9423 Gastrostomy malfunction: Secondary | ICD-10-CM | POA: Diagnosis not present

## 2020-10-01 DIAGNOSIS — T85528A Displacement of other gastrointestinal prosthetic devices, implants and grafts, initial encounter: Secondary | ICD-10-CM

## 2020-10-01 DIAGNOSIS — I5032 Chronic diastolic (congestive) heart failure: Secondary | ICD-10-CM | POA: Insufficient documentation

## 2020-10-01 NOTE — ED Provider Notes (Signed)
[facility] Emergency Department Provider Note ____________________________________________   Event Date/Time   First MD Initiated Contact with Patient 10/01/20 1730     (approximate)  I have reviewed the triage vital signs and the nursing notes.   HISTORY  Chief Complaint No chief complaint on file.  HPI Angela David is a 80 y.o. female with history as listed below presents to the emergency department for treatment and evaluation after G-tube came out when she coughed. She denies pain or bleeding from the area. Tube was placed in December of 2021.         Past Medical History:  Diagnosis Date   Breast cancer [facility]) 2002   right breast chemo and radiation   Cancer [facility]) 2002   right breast   Diabetes mellitus without complication ([place])    Diabetic peripheral neuropathy ([place]) 08/28/2016   GERD (gastroesophageal reflux disease)    Glaucoma    High cholesterol    History of colon polyps    Peripheral neuropathy 08/28/2016   Personal history of chemotherapy 2002   BREAST CA   Personal history of malignant neoplasm of breast    Personal history of radiation therapy 2002   BREAST CA   Vocal cord paralysis 2012    Patient Active Problem List   Diagnosis Date Noted   Chronic respiratory failure with hypoxia ([place]) 03/29/2020   Chronic diastolic CHF (congestive heart failure) ([place]) 01/31/2020   Uncontrolled type 2 diabetes mellitus with hyperglycemia ([place]) 01/31/2020   AF (paroxysmal atrial fibrillation) ([place]) 01/31/2020   Aspiration pneumonia of both lower lobes due to gastric secretions ([place]) 123XX123   Acute metabolic encephalopathy 123XX123   Acute respiratory failure ([place]) 01/23/2020   Left lower lobe pneumonia 01/16/2020   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   Drug-induced polyneuropathy ([place]) 05/05/2019   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[place]) 05/05/2019   Weakness    Hypoxia    Acute respiratory failure with hypoxia  and hypercapnia ([place]) 03/21/2019   Hypotension 03/21/2019   Acute encephalopathy 03/21/2019   Multiple fractures of ribs, right side, initial encounter for closed fracture 12/25/2018   Colon polyp 11/06/2018   Esophageal dysphagia    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    Stricture and stenosis of esophagus    Elevated CEA 09/02/2018   Goals of care, counseling/discussion 09/02/2018   Polyp of transverse colon 09/02/2018   Foreign body in right foot 05/26/2018   History of tracheostomy 04/30/2018   Moderate episode of recurrent major depressive disorder ([place]) 04/03/2018   Poor balance 04/03/2018   Shoulder lesion, right 02/28/2017   Cervical radiculopathy 09/28/2016   Peripheral neuropathy 08/28/2016   Diabetic peripheral neuropathy ([place]) 08/28/2016   Leg weakness, bilateral 08/13/2016   Gait abnormality 08/13/2016   Carcinoma of overlapping sites of right breast in female, estrogen receptor negative ([place]) 12/27/2015   Malignant neoplasm of breast ([place]) 03/28/2015   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[place]) 03/28/2015   Hypercholesterolemia 03/28/2015   Recurrent major depressive disorder, in partial remission ([place]) 03/01/2015   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   Cephalalgia 09/15/2014   Headache due to trauma 09/15/2014   Osteopenia 01/07/2014   Age-related osteoporosis without current pathological fracture 01/07/2014   History of colonic polyps 12/14/2013   History of breast cancer 11/26/2012   H/O malignant neoplasm of breast 11/26/2012   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   Tracheostomy present ([place]) 12/06/2011   Calcification of bronchus or trachea 01/16/2011   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  Anxiety state 12/19/2010   Blood in sputum 12/19/2010   HLD (hyperlipidemia) 12/19/2010   Type 2 diabetes mellitus ([place]) 12/19/2010    Past Surgical History:  Procedure Laterality Date   ABDOMINAL HYSTERECTOMY     BREAST BIOPSY Right 2008   neg   BREAST  BIOPSY Right 06/30/2020   "Q" clip-path pending   BREAST EXCISIONAL BIOPSY Right 2002   positive   BREAST LUMPECTOMY Right 2002   chemo and radiation   BREAST SURGERY Right 2002   LUMPECTOMY, radiation, chemotherapy   CHOLECYSTECTOMY N/A 12/10/2017   Procedure: LAPAROSCOPIC CHOLECYSTECTOMY;  Surgeon: Benjamine Sprague, DO;  Location: [facility] ORS;  Service: General;  Laterality: N/A;   COLON RESECTION Right 11/06/2018   Procedure: HAND ASSISTED LAPAROSCOPIC COLON RESECTION, RIGHT;  Surgeon: Jules Husbands, MD;  Location: [facility] ORS;  Service: General;  Laterality: Right;   COLONOSCOPY  2009,12/30/2013   COLONOSCOPY WITH PROPOFOL N/A 04/16/2018   Tubulovillous polyp proximal transverse colon, large sessile polyp, proximal transverse colon;  Surgeon: Robert Bellow, MD;  Location: [facility] ENDOSCOPY;  Service: Endoscopy;  Laterality: N/A;   ESOPHAGOGASTRODUODENOSCOPY (EGD) WITH PROPOFOL N/A 10/14/2018   Procedure: ESOPHAGOGASTRODUODENOSCOPY (EGD) WITH PROPOFOL;  Surgeon: Lucilla Lame, MD;  Location: [facility] ENDOSCOPY;  Service: Endoscopy;  Laterality: N/A;   HYDROCELE EXCISION / REPAIR     TRACHEAL SURGERY  2013   TRACHEOSTOMY N/A    Dr Kathyrn Sheriff    Prior to Admission medications   Medication Sig Start Date End Date Taking? Authorizing Provider  busPIRone (BUSPAR) 30 MG tablet 1 tablet (30 mg total) by Per NG tube route 2 (two) times daily. 02/13/20   Flora Lipps, MD  escitalopram (LEXAPRO) 10 MG tablet Place 3 tablets (30 mg total) into feeding tube daily. 02/13/20   Flora Lipps, MD  folic acid (FOLVITE) 1 MG tablet Place 1 tablet (1 mg total) into feeding tube daily. 02/13/20   Flora Lipps, MD  gabapentin (NEURONTIN) 250 MG/5ML solution Place 12 mLs (600 mg total) into feeding tube every 8 (eight) hours. 02/13/20   Flora Lipps, MD  lovastatin (MEVACOR) 20 MG tablet Take 20 mg by mouth daily. 06/13/20   [provider]  meclizine (ANTIVERT) 25 MG tablet Take by mouth. 04/21/20   [provider]  melatonin 5 MG TABS Place 1 tablet (5 mg total) into feeding tube at bedtime. 02/13/20   Flora Lipps, MD  metFORMIN (GLUCOPHAGE) 1000 MG tablet Take 1 tablet by mouth daily. 06/10/20   [provider]  metoprolol tartrate (LOPRESSOR) 25 MG tablet Take 25 mg by mouth daily. 06/13/20   [provider]  nystatin (MYCOSTATIN/NYSTOP) powder Apply 1 application topically 3 (three) times daily. 06/23/20   Jacquelin Hawking, NP  pantoprazole (PROTONIX) 40 MG tablet Take 40 mg by mouth daily.    [provider]  rOPINIRole (REQUIP) 0.5 MG tablet Place 1 tablet (0.5 mg total) into feeding tube at bedtime. 02/13/20   Flora Lipps, MD    Allergies Shellfish allergy, Sodium sulfite, Sulfa antibiotics, and Codeine  Family History  Problem Relation Age of Onset   Cancer Father        bone cancer   Cancer Sister        lymphoma   Cancer Brother    Breast cancer Neg Hx     Social History Social History   Tobacco Use   Smoking status: Never   Smokeless tobacco: Never  Vaping Use   Vaping Use: Never used  Substance Use Topics  Alcohol use: No   Drug use: No    Review of Systems  Constitutional: No fever/chills Eyes: No visual changes. ENT: No sore throat. Cardiovascular: Denies chest pain. Respiratory: Denies shortness of breath. Gastrointestinal: No abdominal pain.  No nausea, no vomiting.  No diarrhea.  No constipation. Positive for stoma Genitourinary: Negative for dysuria. Musculoskeletal: Negative for back pain. Skin: Negative for rash. Neurological: Negative for headaches, focal weakness or numbness. ____________________________________________   PHYSICAL EXAM:  VITAL SIGNS: ED Triage Vitals  Enc Vitals Group     BP 10/01/20 1609 (!) 103/59     Pulse Rate 10/01/20 1609 76     Resp 10/01/20 1609 20     Temp 10/01/20 1609 98.6 F (37 C)     Temp Source 10/01/20 1609 Oral     SpO2 10/01/20 1609 96 %     Weight 10/01/20 1610 158  lb (71.7 kg)     Height 10/01/20 1610 '5\' 4"'$  (1.626 m)     Head Circumference --      Peak Flow --      Pain Score 10/01/20 1610 0     Pain Loc --      Pain Edu? --      Excl. in [place]? --     Constitutional: Alert and oriented. No acute distress. Eyes: Conjunctivae are normal. Head: Atraumatic. Nose: No congestion/rhinnorhea. Mouth/Throat: Mucous membranes are moist.   Neck: No stridor. Trach.  Cardiovascular: Good peripheral circulation. Respiratory: Normal respiratory effort. Gastrointestinal: Soft and nontender. No distention. Ventral hernia present, soft and without evidence of incarceration. Genitourinary:  Musculoskeletal: No lower extremity tenderness nor edema.  No joint effusions. Neurologic:  Normal speech and language. No gross focal neurologic deficits are appreciated.  Skin:  Skin is warm, dry and intact. No rash noted.  Stoma is beefy red and without surrounding erythema or edema. Psychiatric: Mood and affect are normal. Speech and behavior are normal.  ____________________________________________   LABS (all labs ordered are listed, but only abnormal results are displayed)  Labs Reviewed - No data to display ____________________________________________  EKG   ____________________________________________  RADIOLOGY  ED MD interpretation:    G-tube projects over the left abdomen.  I, Sherrie George, personally viewed and evaluated these images (plain radiographs) as part of my medical decision making, as well as reviewing the written report by the radiologist.  Official radiology report(s): DG Abdomen 1 View  Result Date: 10/01/2020 CLINICAL DATA:  G-tube placement EXAM: ABDOMEN - 1 VIEW COMPARISON:  None. FINDINGS: Gastrostomy tube projects over the left abdomen. Nonobstructive bowel gas pattern. No free air or organomegaly. Visualized lung bases clear. IMPRESSION: Gastrostomy tube projects over the left abdomen. Exact position cannot be determined on this  KUB without contrast injection through the tube. No evidence of bowel obstruction. Electronically Signed   By: Rolm Baptise M.D.   On: 10/01/2020 18:22    ____________________________________________   PROCEDURES  Procedure(s) performed (including Critical Care):  Procedures  64 French gastrostomy tube inserted without resistance through patent stoma.  Balloon filled with 5 mL of sterile water. Flushed easily. ____________________________________________   INITIAL IMPRESSION / ASSESSMENT AND PLAN   80 year old female presents to the emergency department for replacement of gastrostomy tube.   ED COURSE  Gastrostomy tube inserted as described above. Patient to follow up with primary care or return to the ER for symptoms of concern.    ___________________________________________   FINAL CLINICAL IMPRESSION(S) / ED DIAGNOSES  Final diagnoses:  Dislodged gastrostomy  tube     ED Discharge Orders     None        Darshae Munion was evaluated in Emergency Department on 10/01/2020 for the symptoms described in the history of present illness. She was evaluated in the context of the global COVID-19 pandemic, which necessitated consideration that the patient might be at risk for infection with the SARS-CoV-2 virus that causes COVID-19. Institutional protocols and algorithms that pertain to the evaluation of patients at risk for COVID-19 are in a state of rapid change based on information released by regulatory bodies including the CDC and federal and state organizations. These policies and algorithms were followed during the patient's care in the ED.   Note:  This document was prepared using Dragon voice recognition software and may include unintentional dictation errors.    Victorino Dike, FNP 10/01/20 [id number]    Delman Kitten, MD 10/01/20 1911

## 2020-10-01 NOTE — ED Triage Notes (Signed)
Pt via POV from home. Pt states that her feeding tube came out around 3:00pm today. Denies pain, pt is A&OX4 and NAD.

## 2020-10-09 IMAGING — PT NUCLEAR MEDICINE PET IMAGE RESTAGING (PS) SKULL BASE TO THIGH
3 series · 30 of 30 positions shown · non-contrast
Comparison: 12/26/2010

CLINICAL DATA: Subsequent treatment strategy for breast carcinoma.
Persistent elevation of CEA level.

EXAM:
NUCLEAR MEDICINE PET SKULL BASE TO THIGH
TECHNIQUE: 8.0 mCi F-18 FDG was injected intravenously. Full-ring PET imaging
was performed from the skull base to thigh after the radiotracer. CT
data was obtained and used for attenuation correction and anatomic
localization.
Fasting blood glucose: 140 mg/dl

[Series 3: ct wb 5.0 b30f · axial · 0.98mm/px · z∈[-1060,-194]mm · 10 of 290 slices shown]
[im 1/290  brain]
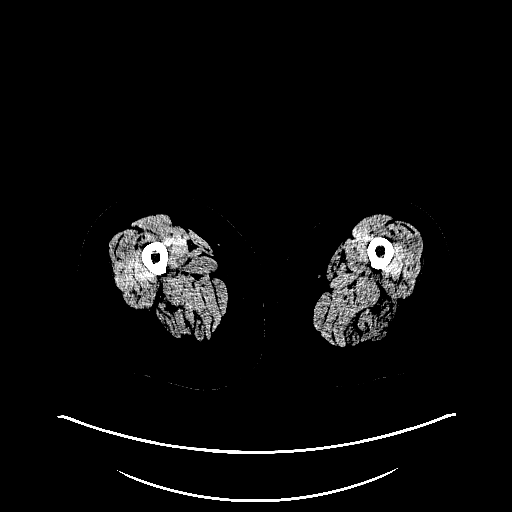
[im 33/290  brain]
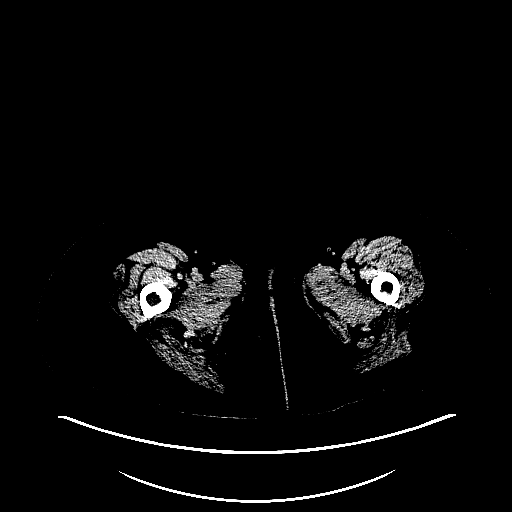
[im 65/290  brain]
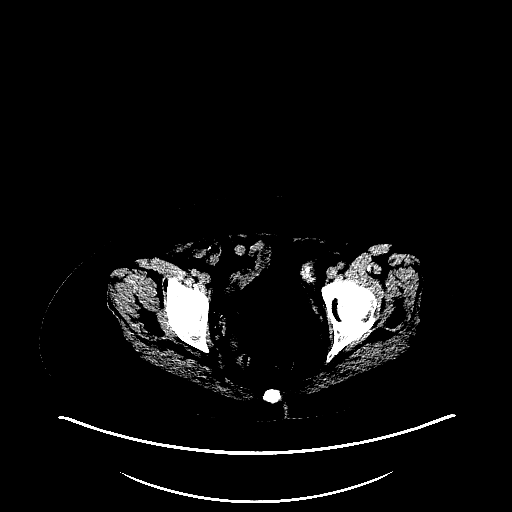
[im 97/290  brain]
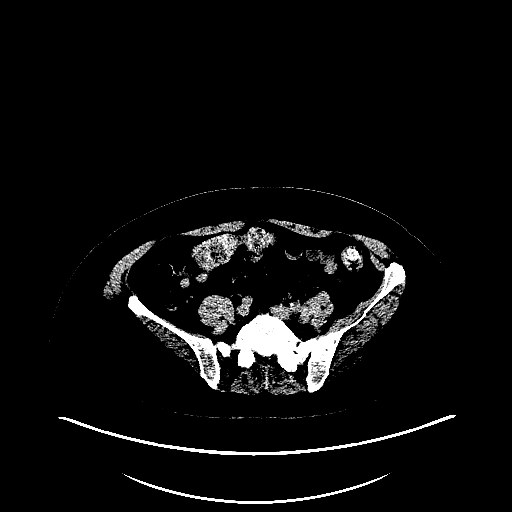
[im 129/290  brain]
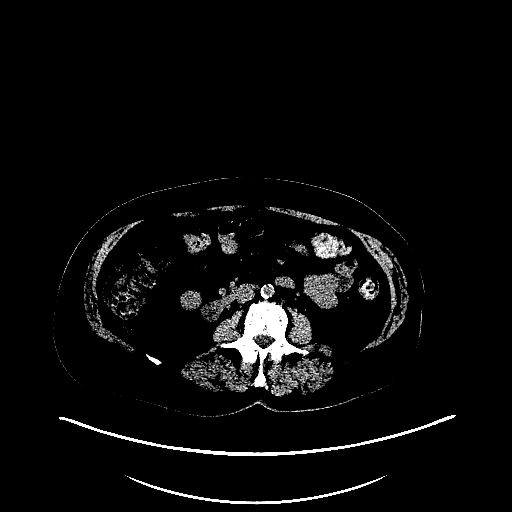
[im 161/290  brain]
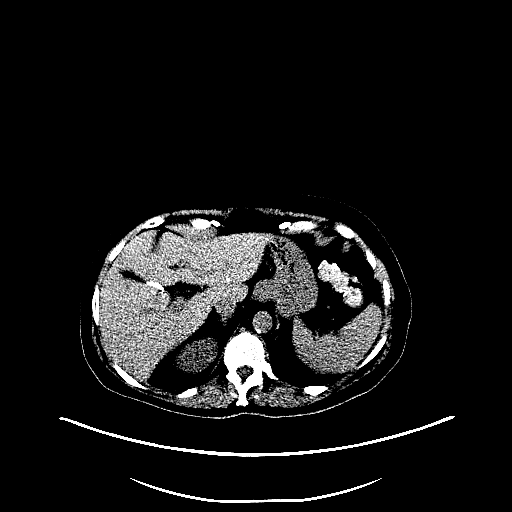
[im 193/290  brain]
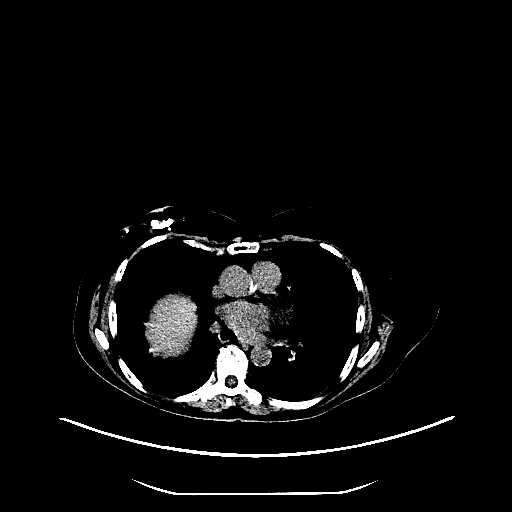
[im 225/290  brain]
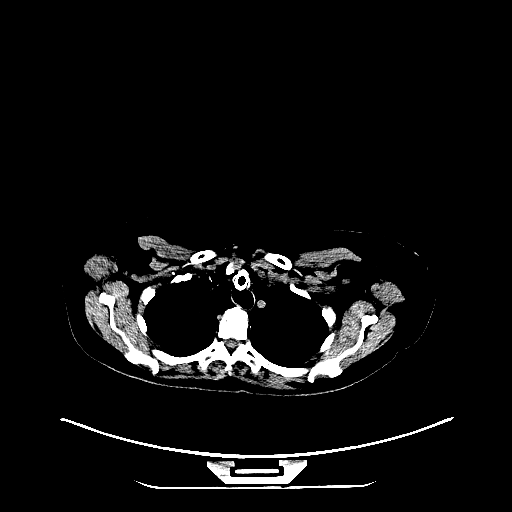
[im 257/290  brain]
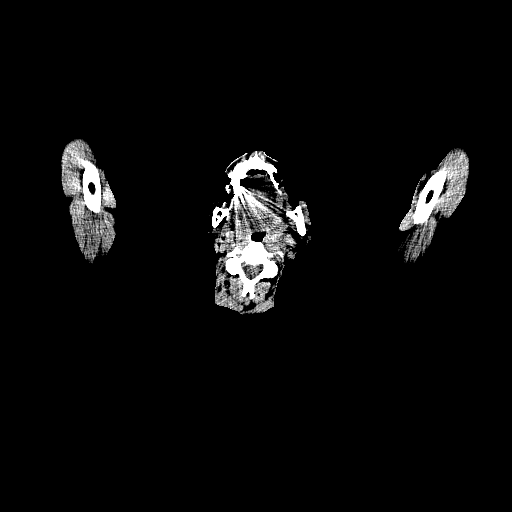
[im 290/290  brain]
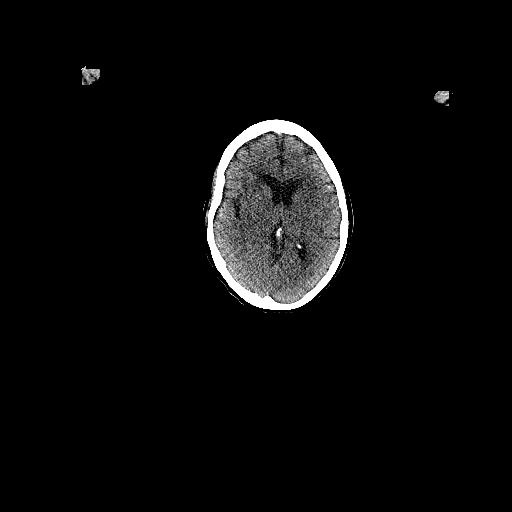

[Series 4: pet wb (ac) · axial · 5.0mm · 2.72mm/px · z∈[-1060,-194]mm · 10 of 290 slices shown]
[im 1/290]
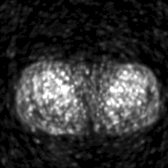
[im 33/290]
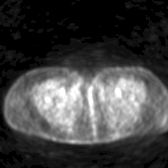
[im 65/290]
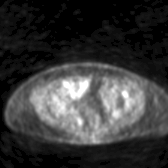
[im 97/290]
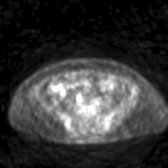
[im 129/290]
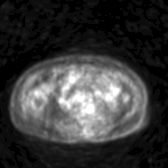
[im 161/290]
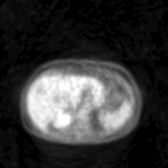
[im 193/290]
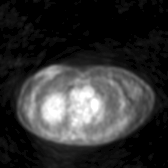
[im 225/290]
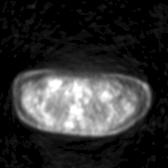
[im 257/290]
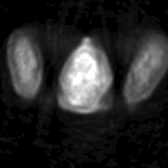
[im 290/290]
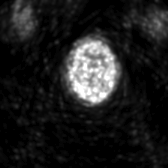

[Series 5: pet wb uncorrected (nac) · axial · 5.0mm · 4.07mm/px · z∈[-1060,-194]mm · 10 of 290 slices shown]
[im 1/290]
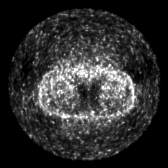
[im 33/290]
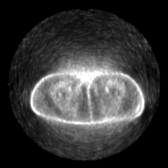
[im 65/290]
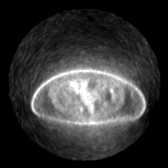
[im 97/290]
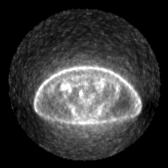
[im 129/290]
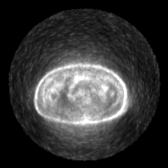
[im 161/290]
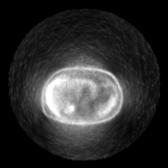
[im 193/290]
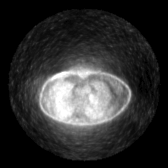
[im 225/290]
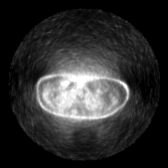
[im 257/290]
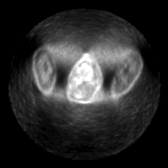
[im 290/290]
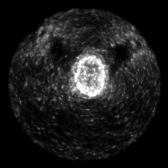

[30 of 30 positions shown; findings below may reference images not displayed]

FINDINGS: Mediastinal blood-pool activity (background): SUV max =

Liver activity (reference): SUV max = N/A

NECK:  No hypermetabolic lymph nodes or masses.

Incidental CT findings:  None.

CHEST: No hypermetabolic masses or lymphadenopathy. No
hypermetabolic pulmonary nodules or masses. Sub-cm bilateral
pulmonary nodules remain stable, consistent with benign
postinflammatory etiology. Right upper lobe scarring is stable.
There has been resolution of left pleural effusion since prior exam.
Tracheostomy tube noted in appropriate position.

Incidental CT findings:  None.

ABDOMEN/PELVIS: No abnormal hypermetabolic activity within the
liver, pancreas, adrenal glands, or spleen. No hypermetabolic lymph
nodes in the abdomen or pelvis.

Incidental CT findings:  Prior cholecystectomy and hysterectomy.

SKELETON: No focal hypermetabolic bone lesions to suggest skeletal
metastasis.

Incidental CT findings:  None.
IMPRESSION: No evidence of metabolically active malignancy or metastatic
disease.

## 2020-10-10 ENCOUNTER — Ambulatory Visit: Payer: Medicare Other | Admitting: Surgery

## 2020-10-12 ENCOUNTER — Other Ambulatory Visit: Payer: Self-pay | Admitting: Surgery

## 2020-10-12 ENCOUNTER — Encounter: Payer: Self-pay | Admitting: Surgery

## 2020-10-12 ENCOUNTER — Ambulatory Visit (INDEPENDENT_AMBULATORY_CARE_PROVIDER_SITE_OTHER): Payer: Medicare Other | Admitting: Surgery

## 2020-10-12 ENCOUNTER — Telehealth: Payer: Self-pay | Admitting: Surgery

## 2020-10-12 ENCOUNTER — Other Ambulatory Visit: Payer: Self-pay

## 2020-10-12 DIAGNOSIS — N6310 Unspecified lump in the right breast, unspecified quadrant: Secondary | ICD-10-CM

## 2020-10-12 DIAGNOSIS — R928 Other abnormal and inconclusive findings on diagnostic imaging of breast: Secondary | ICD-10-CM

## 2020-10-12 DIAGNOSIS — K432 Incisional hernia without obstruction or gangrene: Secondary | ICD-10-CM

## 2020-10-12 NOTE — Progress Notes (Signed)
Surgical Consultation  10/12/2020  Laurence Pirkey Dykeman is an 80 y.o. female.   Chief Complaint  Patient presents with   Follow-up    Discuss surgery for ventral hernia     HPI: Angela David is following up for ventral hernia from laparoscopic colectomy.  She states that the hernia defect is getting larger.  Some intermittent pains.  She did well after recent G-tube.  She is tolerating tube feeds.  In addition to that she does have a prior history of breast cancer in 2002 on the right side that required chemotherapy and radiation therapy. Most recent mammogram personally reviewed showing evidence located 10:00 4 cm from the nipple.  Of a lesion within the upper outer quadrant of the right breast.  This area underwent ultrasound-guided biopsy showing evidence of residual atypical breast disease as well fat necrosis and dystrophic calcifications.  No evidence of recurrence but there was a discrepancy upon evaluating imaging studies and comparing to the final pathology. I do agree that she will further need excisional biopsy of this area and it can be done under ultrasound and RFA guidance She is otherwise doing well and she does have a trach and is not supplementing vent at night.  No recent hospitalizations.   Past Medical History:  Diagnosis Date   Breast cancer ([place]) 2002   right breast chemo and radiation   Cancer [facility]) 2002   right breast   Diabetes mellitus without complication ([place])    Diabetic peripheral neuropathy ([place]) 08/28/2016   GERD (gastroesophageal reflux disease)    Glaucoma    High cholesterol    History of colon polyps    Peripheral neuropathy 08/28/2016   Personal history of chemotherapy 2002   BREAST CA   Personal history of malignant neoplasm of breast    Personal history of radiation therapy 2002   BREAST CA   Vocal cord paralysis 2012    Past Surgical History:  Procedure Laterality Date   ABDOMINAL HYSTERECTOMY     BREAST BIOPSY Right 2008   neg   BREAST  BIOPSY Right 06/30/2020   "Q" clip-path pending   BREAST EXCISIONAL BIOPSY Right 2002   positive   BREAST LUMPECTOMY Right 2002   chemo and radiation   BREAST SURGERY Right 2002   LUMPECTOMY, radiation, chemotherapy   CHOLECYSTECTOMY N/A 12/10/2017   Procedure: LAPAROSCOPIC CHOLECYSTECTOMY;  Surgeon: Benjamine Sprague, DO;  Location: [facility] ORS;  Service: General;  Laterality: N/A;   COLON RESECTION Right 11/06/2018   Procedure: HAND ASSISTED LAPAROSCOPIC COLON RESECTION, RIGHT;  Surgeon: Jules Husbands, MD;  Location: [facility] ORS;  Service: General;  Laterality: Right;   COLONOSCOPY  2009,12/30/2013   COLONOSCOPY WITH PROPOFOL N/A 04/16/2018   Tubulovillous polyp proximal transverse colon, large sessile polyp, proximal transverse colon;  Surgeon: Robert Bellow, MD;  Location: [facility] ENDOSCOPY;  Service: Endoscopy;  Laterality: N/A;   ESOPHAGOGASTRODUODENOSCOPY (EGD) WITH PROPOFOL N/A 10/14/2018   Procedure: ESOPHAGOGASTRODUODENOSCOPY (EGD) WITH PROPOFOL;  Surgeon: Lucilla Lame, MD;  Location: [facility] ENDOSCOPY;  Service: Endoscopy;  Laterality: N/A;   HYDROCELE EXCISION / REPAIR     TRACHEAL SURGERY  2013   TRACHEOSTOMY N/A    Dr Kathyrn Sheriff    Family History  Problem Relation Age of Onset   Cancer Father        bone cancer   Cancer Sister        lymphoma   Cancer Brother    Breast cancer Neg Hx     Social History:  reports that she  has never smoked. She has never used smokeless tobacco. She reports that she does not drink alcohol and does not use drugs.  Allergies:  Allergies  Allergen Reactions   Shellfish Allergy Shortness Of Breath    respitatory distress    Sodium Sulfite Shortness Of Breath    Severe Congestion that creates inability to breath   Sulfa Antibiotics Shortness Of Breath and Other (See Comments)    respitatory distress   Codeine Other (See Comments)    Per patient "it kept her up all night"    Medications reviewed.     ROS Full ROS performed and is otherwise  negative other than what is stated in the HPI    There were no vitals taken for this visit.  Physical Exam Vitals and nursing note reviewed. Exam conducted with a chaperone present.  Constitutional:      General: She is not in acute distress.    Appearance: Normal appearance. She is not ill-appearing or toxic-appearing.  Eyes:     General: No scleral icterus.       Right eye: No discharge.        Left eye: No discharge.  Neck:     Comments: Trach in place, she occludes it and is able to talk. Cardiovascular:     Rate and Rhythm: Normal rate and regular rhythm.     Heart sounds: Normal heart sounds. No murmur heard. Pulmonary:     Effort: Pulmonary effort is normal. No respiratory distress.     Breath sounds: Normal breath sounds. No stridor.     Comments: BREAST: Right breast with prior lumpectomy scar upper outer quadrant.  There is some scar tissue and some induration ( radiation changes) no clear evidence of definitive palpable masses.  There is no evidence of lymphadenopathy.  There is no evidence of skin or nipple involvement.  The left breast is completely normal without any lesions.  Left axilla is clear Abdominal:     General: Abdomen is flat. There is no distension.     Palpations: Abdomen is soft. There is no mass.     Tenderness: There is no right CVA tenderness, left CVA tenderness, guarding or rebound.     Hernia: A hernia is present.     Comments: Reducible midline incisional hernia 5 cms size, G tube in place. No infection or peritonitis  Musculoskeletal:        General: No swelling or tenderness. Normal range of motion.     Cervical back: Normal range of motion and neck supple. No rigidity.  Skin:    General: Skin is warm and dry.     Coloration: Skin is not jaundiced or pale.  Neurological:     General: No focal deficit present.     Mental Status: She is alert and oriented to person, place, and time.  Psychiatric:        Mood and Affect: Mood normal.         Behavior: Behavior normal.        Thought Content: Thought content normal.        Judgment: Judgment normal.      Assessment/Plan: 80 year old female with 2 main issues:  #1 is incisional hernia with symptoms that has enlarged.  I do think that she will be appropriate for robotic hernia repair with mesh.  # 2 right breast discordant lesion pathology and imaging  on a patient with a prior history of breast cancer.  This will require excisional biopsy lumpectomy We will  plan for RFA guided right lumpectomy and robotic incisional hernia repair during the same operative setting.  Procedure discussed with patient in detail as well as with her husband.  Risk, benefits and possible complications including but not limited to: Bleeding, infection hernia recurrence, mesh issues, chronic pain.  Regarding the breast specifically discussed with her about potential of upgrading pathology and requiring further intervention if that would be the case.  Extensive counseling provided. Please note that I spent over 40 minutes in this encounter with very 50% spent in coordination and counseling of  Caroleen Hamman, MD [facility] General Surgeon

## 2020-10-12 NOTE — H&P (View-Only) (Signed)
Surgical Consultation  10/12/2020  Angela David is an 80 y.o. female.   Chief Complaint  Patient presents with   Follow-up    Discuss surgery for ventral hernia     HPI: Angela David is following up for ventral hernia from laparoscopic colectomy.  She states that the hernia defect is getting larger.  Some intermittent pains.  She did well after recent G-tube.  She is tolerating tube feeds.  In addition to that she does have a prior history of breast cancer in 2002 on the right side that required chemotherapy and radiation therapy. Most recent mammogram personally reviewed showing evidence located 10:00 4 cm from the nipple.  Of a lesion within the upper outer quadrant of the right breast.  This area underwent ultrasound-guided biopsy showing evidence of residual atypical breast disease as well fat necrosis and dystrophic calcifications.  No evidence of recurrence but there was a discrepancy upon evaluating imaging studies and comparing to the final pathology. I do agree that she will further need excisional biopsy of this area and it can be done under ultrasound and RFA guidance She is otherwise doing well and she does have a trach and is not supplementing vent at night.  No recent hospitalizations.   Past Medical History:  Diagnosis Date   Breast cancer ([place]) 2002   right breast chemo and radiation   Cancer [facility]) 2002   right breast   Diabetes mellitus without complication ([place])    Diabetic peripheral neuropathy ([place]) 08/28/2016   GERD (gastroesophageal reflux disease)    Glaucoma    High cholesterol    History of colon polyps    Peripheral neuropathy 08/28/2016   Personal history of chemotherapy 2002   BREAST CA   Personal history of malignant neoplasm of breast    Personal history of radiation therapy 2002   BREAST CA   Vocal cord paralysis 2012    Past Surgical History:  Procedure Laterality Date   ABDOMINAL HYSTERECTOMY     BREAST BIOPSY Right 2008   neg   BREAST  BIOPSY Right 06/30/2020   "Q" clip-path pending   BREAST EXCISIONAL BIOPSY Right 2002   positive   BREAST LUMPECTOMY Right 2002   chemo and radiation   BREAST SURGERY Right 2002   LUMPECTOMY, radiation, chemotherapy   CHOLECYSTECTOMY N/A 12/10/2017   Procedure: LAPAROSCOPIC CHOLECYSTECTOMY;  Surgeon: Benjamine Sprague, DO;  Location: [facility] ORS;  Service: General;  Laterality: N/A;   COLON RESECTION Right 11/06/2018   Procedure: HAND ASSISTED LAPAROSCOPIC COLON RESECTION, RIGHT;  Surgeon: Jules Husbands, MD;  Location: [facility] ORS;  Service: General;  Laterality: Right;   COLONOSCOPY  2009,12/30/2013   COLONOSCOPY WITH PROPOFOL N/A 04/16/2018   Tubulovillous polyp proximal transverse colon, large sessile polyp, proximal transverse colon;  Surgeon: Robert Bellow, MD;  Location: [facility] ENDOSCOPY;  Service: Endoscopy;  Laterality: N/A;   ESOPHAGOGASTRODUODENOSCOPY (EGD) WITH PROPOFOL N/A 10/14/2018   Procedure: ESOPHAGOGASTRODUODENOSCOPY (EGD) WITH PROPOFOL;  Surgeon: Lucilla Lame, MD;  Location: [facility] ENDOSCOPY;  Service: Endoscopy;  Laterality: N/A;   HYDROCELE EXCISION / REPAIR     TRACHEAL SURGERY  2013   TRACHEOSTOMY N/A    Dr Kathyrn Sheriff    Family History  Problem Relation Age of Onset   Cancer Father        bone cancer   Cancer Sister        lymphoma   Cancer Brother    Breast cancer Neg Hx     Social History:  reports that she  has never smoked. She has never used smokeless tobacco. She reports that she does not drink alcohol and does not use drugs.  Allergies:  Allergies  Allergen Reactions   Shellfish Allergy Shortness Of Breath    respitatory distress    Sodium Sulfite Shortness Of Breath    Severe Congestion that creates inability to breath   Sulfa Antibiotics Shortness Of Breath and Other (See Comments)    respitatory distress   Codeine Other (See Comments)    Per patient "it kept her up all night"    Medications reviewed.     ROS Full ROS performed and is otherwise  negative other than what is stated in the HPI    There were no vitals taken for this visit.  Physical Exam Vitals and nursing note reviewed. Exam conducted with a chaperone present.  Constitutional:      General: She is not in acute distress.    Appearance: Normal appearance. She is not ill-appearing or toxic-appearing.  Eyes:     General: No scleral icterus.       Right eye: No discharge.        Left eye: No discharge.  Neck:     Comments: Trach in place, she occludes it and is able to talk. Cardiovascular:     Rate and Rhythm: Normal rate and regular rhythm.     Heart sounds: Normal heart sounds. No murmur heard. Pulmonary:     Effort: Pulmonary effort is normal. No respiratory distress.     Breath sounds: Normal breath sounds. No stridor.     Comments: BREAST: Right breast with prior lumpectomy scar upper outer quadrant.  There is some scar tissue and some induration ( radiation changes) no clear evidence of definitive palpable masses.  There is no evidence of lymphadenopathy.  There is no evidence of skin or nipple involvement.  The left breast is completely normal without any lesions.  Left axilla is clear Abdominal:     General: Abdomen is flat. There is no distension.     Palpations: Abdomen is soft. There is no mass.     Tenderness: There is no right CVA tenderness, left CVA tenderness, guarding or rebound.     Hernia: A hernia is present.     Comments: Reducible midline incisional hernia 5 cms size, G tube in place. No infection or peritonitis  Musculoskeletal:        General: No swelling or tenderness. Normal range of motion.     Cervical back: Normal range of motion and neck supple. No rigidity.  Skin:    General: Skin is warm and dry.     Coloration: Skin is not jaundiced or pale.  Neurological:     General: No focal deficit present.     Mental Status: She is alert and oriented to person, place, and time.  Psychiatric:        Mood and Affect: Mood normal.         Behavior: Behavior normal.        Thought Content: Thought content normal.        Judgment: Judgment normal.      Assessment/Plan: 80 year old female with 2 main issues:  #1 is incisional hernia with symptoms that has enlarged.  I do think that she will be appropriate for robotic hernia repair with mesh.  # 2 right breast discordant lesion pathology and imaging  on a patient with a prior history of breast cancer.  This will require excisional biopsy lumpectomy We will  plan for RFA guided right lumpectomy and robotic incisional hernia repair during the same operative setting.  Procedure discussed with patient in detail as well as with her husband.  Risk, benefits and possible complications including but not limited to: Bleeding, infection hernia recurrence, mesh issues, chronic pain.  Regarding the breast specifically discussed with her about potential of upgrading pathology and requiring further intervention if that would be the case.  Extensive counseling provided. Please note that I spent over 40 minutes in this encounter with very 50% spent in coordination and counseling of  Caroleen Hamman, MD [facility] General Surgeon

## 2020-10-12 NOTE — Patient Instructions (Addendum)
Our surgery scheduler Pamala Hurry will call you within 24-48 hours to get you scheduled. If you have not heard from her after 48 hours, please call our office. You will not need to get Covid tested before surgery and have the blue sheet available when she calls to write down important information. If you have any concerns or questions, please feel free to call our office.  Laparoscopic Ventral Hernia Repair Laparoscopic ventral hernia repairis a procedure to fix a bulge of tissue that pushes through a weak area of muscle in the abdomen (ventral hernia). A ventral hernia may be: Above the belly button. This is called an epigastric hernia. At the belly button. This is called an umbilical hernia. At the incision site from previous abdominal surgery. This is called an incisional hernia. You may have this procedure as emergency surgery if part of your intestine gets trapped inside the hernia and starts to lose its blood supply (strangulation). Laparoscopic surgery is done through small incisions using a thin surgical telescope with a light and camera (laparoscope). During surgery, your surgeon will use images from the laparoscope to guide the procedure. A mesh screen will be placed in the hernia to close the openingand strengthen the abdominal wall. Tell a health care provider about: Any allergies you have. All medicines you are taking, including vitamins, herbs, eye drops, creams, and over-the-counter medicines. Any problems you or family members have had with anesthetic medicines. Any blood disorders you have. Any surgeries you have had. Any medical conditions you have. Whether you are pregnant or may be pregnant. What are the risks? Generally, this is a safe procedure. However, problems may occur, including: Infection. Bleeding. Damage to nearby structures or organs in the abdomen. Trouble urinating or having a bowel movement after surgery. Blood clots. The hernia coming back after surgery. Fluid  buildup in the area of the hernia. In some cases, your health care provider may need to switch from a laparoscopic procedure to a procedure that is done through a single, larger incision in the abdomen (open procedure). You may need an open procedure if: You have a hernia that is difficult to repair. Your organs are hard to see with the laparoscope. You have bleeding problems during the laparoscopic procedure. What happens before the procedure? Staying hydrated Follow instructions from your health care provider about hydration, which may include: Up to 2 hours before the procedure - you may continue to drink clear liquids, such as water, clear fruit juice, black coffee, and plain tea.  Eating and drinking restrictions Follow instructions from your health care provider about eating and drinking, which may include: 8 hours before the procedure - stop eating heavy meals or foods, such as meat, fried foods, or fatty foods. 6 hours before the procedure - stop eating light meals or foods, such as toast or cereal. 6 hours before the procedure - stop drinking milk or drinks that contain milk. 2 hours before the procedure - stop drinking clear liquids. Medicines Ask your health care provider about: Changing or stopping your regular medicines. This is especially important if you are taking diabetes medicines or blood thinners. Taking medicines such as aspirin and ibuprofen. These medicines can thin your blood. Do not take these medicines unless your health care provider tells you to take them. Taking over-the-counter medicines, vitamins, herbs, and supplements. Tests You may need to have tests before the procedure, such as: Blood tests. Urine tests. Abdominal ultrasound. Chest X-ray. Electrocardiogram (ECG). General instructions You may be asked to take  a laxative or do an enema to empty your bowel before surgery (bowel prep). Do not use any products that contain nicotine or tobacco for at least  4 weeks before the procedure. These products include cigarettes, chewing tobacco, and vaping devices, such as e-cigarettes. If you need help quitting, ask your health care provider. Ask your health care provider: How your surgery site will be marked. What steps will be taken to help prevent infection. These steps may include: Removing hair at the surgery site. Washing skin with a germ-killing soap. Receiving antibiotic medicine. Plan to have a responsible adult take you home from the hospital or clinic. If you will be going home right after the procedure, plan to have a responsible adult care for you for the time you are told. This is important. What happens during the procedure?  An IV will be inserted into one of your veins. You will be given one or more of the following: A medicine to help you relax (sedative). A medicine to numb the area (local anesthetic). A medicine to make you fall asleep (general anesthetic). A small incision will be made in your abdomen. A hollow metal tube (trocar) will be placed through the incision. A tube will be placed through the trocar to inflate your abdomen with carbon dioxide. This makes it easier for your surgeon to see inside your abdomen during the repair. A laparoscope will be inserted into your abdomen through the trocar. The laparoscope will send images to a monitor in the operating room. Other trocars will be put through other small incisions in your abdomen. The surgical instruments needed for the procedure will be placed through these trocars. The tissue or intestines that make up the hernia will be moved back into place. The edges of the hernia may be stitched (sutured) together. A piece of mesh will be used to close the hernia. Sutures, clips, or staples will be used to keep the mesh in place. A bandage (dressing) or skin glue will be put over the incisions. The procedure may vary among health care providers and hospitals. What happens after the  procedure? Your blood pressure, heart rate, breathing rate, and blood oxygen level will be monitored until you leave the hospital or clinic. You will continue to receive fluids and medicines through an IV. Your IV will be removed when you can drink clear fluids. You will be given pain medicine as needed. You will be encouraged to get up and walk around as soon as possible. You will be shown how to do deep breathing exercises to help prevent a lung infection. If you were given a sedative during the procedure, it can affect you for several hours. Do not drive or operate machinery until your health care provider says that it is safe. Summary Laparoscopic ventral hernia is an operation to fix a hernia using small incisions. Tell your health care provider about other medical conditions that you have and about all the medicines that you are taking. Follow instructions from your health care provider about eating and drinking before the procedure. Plan to have a responsible adult take you home from the hospital or clinic. After the procedure, you will be encouraged to walk as soon as possible. You will also be taught how to do deep breathing exercises. This information is not intended to replace advice given to you by your health care provider. Make sure you discuss any questions you have with your healthcare provider. Document Revised: 10/02/2019 Document Reviewed: 10/02/2019 Elsevier Patient Education  [place]  A lumpectomy, sometimes called a partial mastectomy, is surgery to remove a cancerous tumor or mass (the lump) from a breast. It is a form of breast-conserving or breast-preservation surgery. This means that the canceroustissue is removed but the breast remains intact. During a lumpectomy, the portion of the breast that contains the tumor is removed. Some normal tissue around the lump may be taken out to make sure that all of the tumor has been removed. Lymph nodes under  your arm may also be removed and tested to find out if the cancer has spread. Lymph nodes are part of the body's disease-fighting system (immune system) and are usually the first place where breast cancer spreads. Tell a health care provider about: Any allergies you have. All medicines you are taking, including vitamins, herbs, eye drops, creams, and over-the-counter medicines. Any problems you or family members have had with anesthetic medicines. Any blood disorders you have. Any surgeries you have had. Any medical conditions you have. Whether you are pregnant or may be pregnant. What are the risks? Generally, this is a safe procedure. However, problems may occur, including: Bleeding. Infection. Allergic reaction to medicines. Pain, swelling, weakness, or numbness in the arm on the side of your surgery. Temporary swelling. Change in the shape of the breast, particularly if a large portion is removed. Scar tissue that forms at the surgical site and feels hard to the touch. Blood clots. What happens before the procedure? Staying hydrated Follow instructions from your health care provider about hydration, which may include: Up to 2 hours before the procedure - you may continue to drink clear liquids, such as water, clear fruit juice, black coffee, and plain tea.  Eating and drinking restrictions Follow instructions from your health care provider about eating and drinking, which may include: 8 hours before the procedure - stop eating heavy meals or foods, such as meat, fried foods, or fatty foods. 6 hours before the procedure - stop eating light meals or foods, such as toast or cereal. 6 hours before the procedure - stop drinking milk or drinks that contain milk. 2 hours before the procedure - stop drinking clear liquids. Medicines Ask your health care provider about: Changing or stopping your regular medicines. This is especially important if you are taking diabetes medicines or blood  thinners. Taking medicines such as aspirin and ibuprofen. These medicines can thin your blood. Do not take these medicines unless your health care provider tells you to take them. Taking over-the-counter medicines, vitamins, herbs, and supplements. General instructions Prior to surgery, your health care provider may do a procedure to locate and mark the tumor area in your breast (localization). This will help guide your surgeon to where the incision will be made. This may be done with: Imaging, such as a mammogram, ultrasound, or MRI. Insertion of a small wire, clip, or seed, or an implant that will reflect a radar signal. You may have screening tests or exams to get baseline measurements of your arm. These can be compared to measurements done after surgery to monitor for swelling (lymphedema) that can develop after having lymph nodes removed. Ask your health care provider: How your surgery site will be marked. What steps will be taken to help prevent infection. These may include: Washing skin with a germ-killing soap. Taking antibiotic medicine. Plan to have someone take you home from the hospital or clinic. Plan to have a responsible adult care for you for at least 24 hours after you  leave the hospital or clinic. This is important. What happens during the procedure?  An IV will be inserted into one of your veins. You will be given one or more of the following: A medicine to help you relax (sedative). A medicine to numb the area (local anesthetic). A medicine to make you fall asleep (general anesthetic). Your health care provider will use a kind of electric scalpel that uses heat to reduce bleeding (electrocautery knife). A curved incision that follows the natural curve of your breast will be made. This type of incision will allow for minimal scarring and better healing. The tumor will be removed along with some of the tissue around it. This will be sent to the lab for testing. Your health  care provider may also remove lymph nodes at this time if needed. If the tumor is close to the muscles over your chest, some muscle tissue may also be removed. A small drain tube may be inserted into your breast area or armpit to collect fluid that may build up after surgery. This tube will be connected to a suction bulb on the outside of your body to remove the fluid. The incision will be closed with stitches (sutures). A bandage (dressing) may be placed over the incision. The procedure may vary among health care providers and hospitals. What happens after the procedure? Your blood pressure, heart rate, breathing rate, and blood oxygen level will be monitored until you leave the hospital or clinic. You will be given medicine for pain as needed. Your IV will be removed when you are able to eat and drink by mouth. You will be encouraged to get up and walk as soon as you can. This is important to improve blood flow and breathing. Ask for help if you feel weak or unsteady. You may have: A drain tube in place for 2-3 days to prevent a collection of blood (hematoma) from developing in the breast. You will be given instructions about caring for the drain before you go home. A pressure bandage applied for 1-2 days to prevent bleeding or swelling. Your pressure bandage may look like a thick piece of fabric or an elastic wrap. Ask your health care provider how to care for your bandage at home. You may be given a tight sleeve to wear over your arm on the side of your surgery. You should wear this sleeve as told by your health care provider. Do not drive for 24 hours if you were given a sedative during your procedure. Summary A lumpectomy, sometimes called a partial mastectomy, is surgery to remove a cancerous tumor or mass (the lump) from a breast. During a lumpectomy, the portion of the breast that contains the tumor is removed. Lymph nodes under your arm may also be removed and tested to find out if the  cancer has spread. Plan to have someone take you home from the hospital or clinic. You may have a drain tube in place for 2-3 days to prevent a collection of blood (hematoma) from developing in the breast. You will be given instructions about caring for the drain before you go home. This information is not intended to replace advice given to you by your health care provider. Make sure you discuss any questions you have with your healthcare provider. Document Revised: 08/18/2018 Document Reviewed: 08/18/2018 Elsevier Patient Education  [place].

## 2020-10-12 NOTE — Telephone Encounter (Signed)
Patient has been advised of Pre-Admission date/time, COVID Testing date and Surgery date.  Surgery Date: 10/18/20 Preadmission Testing Date: 10/17/20 (phone 8a-1p) Covid Testing Date: Not needed.     Patient has been made aware to call [phone], between 1-3:00pm the day before surgery, to find out what time to arrive for surgery.   Patient also reminded of her RF tag to be placed at [place] on 10/14/20.   Patient verbalized understanding with the above.

## 2020-10-14 ENCOUNTER — Ambulatory Visit
Admission: RE | Admit: 2020-10-14 | Discharge: 2020-10-14 | Disposition: A | Discharge status: Home / Self Care | Payer: Medicare Other | Source: Ambulatory Visit | Attending: Surgery | Admitting: Surgery

## 2020-10-14 ENCOUNTER — Other Ambulatory Visit: Payer: Self-pay

## 2020-10-14 ENCOUNTER — Ambulatory Visit: Admission: RE | Admit: 2020-10-14 | Payer: Medicare Other | Source: Ambulatory Visit

## 2020-10-14 DIAGNOSIS — N6310 Unspecified lump in the right breast, unspecified quadrant: Secondary | ICD-10-CM | POA: Insufficient documentation

## 2020-10-14 DIAGNOSIS — R928 Other abnormal and inconclusive findings on diagnostic imaging of breast: Secondary | ICD-10-CM

## 2020-10-17 ENCOUNTER — Encounter
Admission: RE | Admit: 2020-10-17 | Discharge: 2020-10-17 | Disposition: A | Discharge status: Home / Self Care | Payer: Medicare Other | Source: Ambulatory Visit | Attending: Surgery | Admitting: Surgery

## 2020-10-17 ENCOUNTER — Other Ambulatory Visit: Payer: Self-pay

## 2020-10-17 ENCOUNTER — Encounter: Payer: Self-pay | Admitting: Surgery

## 2020-10-17 HISTORY — DX: Ventral hernia without obstruction or gangrene: K43.9

## 2020-10-17 NOTE — Patient Instructions (Addendum)
Your procedure is scheduled on: October 18, 2020 TUESDAY Report to the Registration Desk on the 1st floor of the [facility]. To find out your arrival time, please call [phone] between 1PM - 3PM on: Monday October 17, 2020  REMEMBER: Instructions that are not followed completely may result in serious medical risk, up to and including death; or upon the discretion of your surgeon and anesthesiologist your surgery may need to be rescheduled.  Do not eat food after midnight the night before surgery.  No gum chewing, lozengers or hard candies.  You may however, drink CLEAR liquids up to 2 hours before you are scheduled to arrive for your surgery. Do not drink anything within 2 hours of your scheduled arrival time.  Clear liquids include: - water   Type 1 and Type 2 diabetics should only drink water.  TAKE THESE MEDICATIONS THE MORNING OF SURGERY WITH A SIP OF WATER: METOPROLOL GABAPENTIN LEXAPRO  BUSPIRONE PANTOPRAZOLE (take one the night before and one on the morning of surgery - helps to prevent nausea after surgery.)  Stop Metformin 2 days prior to surgery. PT TOOK 10/17/2020 BEFORE PAT PHONE CALL  One week prior to surgery: Stop Anti-inflammatories (NSAIDS) such as Advil, Aleve, Ibuprofen, Motrin, Naproxen, Naprosyn and Aspirin based products such as Excedrin, Goodys Powder, BC Powder. Stop ANY OVER THE COUNTER supplements until after surgery. You may however, continue to take Tylenol if needed for pain up until the day of surgery.  No Alcohol for 24 hours before or after surgery.  No Smoking including e-cigarettes for 24 hours prior to surgery.  No chewable tobacco products for at least 6 hours prior to surgery.  No nicotine patches on the day of surgery.  Do not use any "recreational" drugs for at least a week prior to your surgery.  Please be advised that the combination of cocaine and anesthesia may have negative outcomes, up to and including death. If you test  positive for cocaine, your surgery will be cancelled.  On the morning of surgery brush your teeth with toothpaste and water, you may rinse your mouth with mouthwash if you wish. Do not swallow any toothpaste or mouthwash.  Do not wear jewelry, make-up, hairpins, clips or nail polish.  Do not wear lotions, powders, or perfumes OR DEODORANT   Do not shave body from the neck down 48 hours prior to surgery just in case you cut yourself which could leave a site for infection.  Also, freshly shaved skin may become irritated if using the CHG soap.  Contact lenses, hearing aids and dentures may not be worn into surgery.  Do not bring valuables to the hospital. [facility] is not responsible for any missing/lost belongings or valuables.   SHOWER MORNING OF SURGERY  Notify your doctor if there is any change in your medical condition (cold, fever, infection).  Wear comfortable clothing (specific to your surgery type) to the hospital.  After surgery, you can help prevent lung complications by doing breathing exercises.  Take deep breaths and cough every 1-2 hours. Your doctor may order a device called an Incentive Spirometer to help you take deep breaths. When coughing or sneezing, hold a pillow firmly against your incision with both hands. This is called "splinting." Doing this helps protect your incision. It also decreases belly discomfort.  If you are being discharged the day of surgery, you will not be allowed to drive home. You will need a responsible adult (18 years or older) to drive you home  and stay with you that night.   If you are taking public transportation, you will need to have a responsible adult (18 years or older) with you. Please confirm with your physician that it is acceptable to use public transportation.   Please call the [facility] at [phone] if you have any questions about these instructions.  Surgery Visitation Policy:  Patients undergoing a  surgery or procedure may have one family member or support person with them as long as that person is not COVID-19 positive or experiencing its symptoms.  That person may remain in the waiting area during the procedure.  Inpatient Visitation:    Visiting hours are 7 a.m. to 8 p.m. Inpatients will be allowed two visitors daily. The visitors may change each day during the patient's stay. No visitors under the age of 61. Any visitor under the age of 47 must be accompanied by an adult. The visitor must pass COVID-19 screenings, use hand sanitizer when entering and exiting the patient's room and wear a mask at all times, including in the patient's room. Patients must also wear a mask when staff or their visitor are in the room. Masking is required regardless of vaccination status.

## 2020-10-18 ENCOUNTER — Ambulatory Visit: Payer: Medicare Other | Admitting: Urgent Care

## 2020-10-18 ENCOUNTER — Ambulatory Visit
Admission: RE | Admit: 2020-10-18 | Discharge: 2020-10-18 | Disposition: A | Discharge status: Home / Self Care | Payer: Medicare Other | Source: Ambulatory Visit | Attending: Surgery | Admitting: Surgery

## 2020-10-18 ENCOUNTER — Encounter: Payer: Self-pay | Admitting: Surgery

## 2020-10-18 ENCOUNTER — Encounter
Admission: RE | Disposition: A | Discharge status: Home / Self Care | Payer: Self-pay | Source: Home / Self Care | Attending: Surgery

## 2020-10-18 ENCOUNTER — Ambulatory Visit
Admission: RE | Admit: 2020-10-18 | Discharge: 2020-10-18 | Disposition: A | Discharge status: Home / Self Care | Payer: Medicare Other | Attending: Surgery | Admitting: Surgery

## 2020-10-18 DIAGNOSIS — N6311 Unspecified lump in the right breast, upper outer quadrant: Secondary | ICD-10-CM | POA: Diagnosis not present

## 2020-10-18 DIAGNOSIS — N6310 Unspecified lump in the right breast, unspecified quadrant: Secondary | ICD-10-CM

## 2020-10-18 DIAGNOSIS — K432 Incisional hernia without obstruction or gangrene: Secondary | ICD-10-CM | POA: Insufficient documentation

## 2020-10-18 DIAGNOSIS — Z882 Allergy status to sulfonamides status: Secondary | ICD-10-CM | POA: Diagnosis not present

## 2020-10-18 DIAGNOSIS — N641 Fat necrosis of breast: Secondary | ICD-10-CM | POA: Insufficient documentation

## 2020-10-18 DIAGNOSIS — Z853 Personal history of malignant neoplasm of breast: Secondary | ICD-10-CM | POA: Insufficient documentation

## 2020-10-18 DIAGNOSIS — Z885 Allergy status to narcotic agent status: Secondary | ICD-10-CM | POA: Diagnosis not present

## 2020-10-18 DIAGNOSIS — Z923 Personal history of irradiation: Secondary | ICD-10-CM | POA: Diagnosis not present

## 2020-10-18 DIAGNOSIS — Z931 Gastrostomy status: Secondary | ICD-10-CM | POA: Diagnosis not present

## 2020-10-18 DIAGNOSIS — E1142 Type 2 diabetes mellitus with diabetic polyneuropathy: Secondary | ICD-10-CM | POA: Insufficient documentation

## 2020-10-18 DIAGNOSIS — Z9221 Personal history of antineoplastic chemotherapy: Secondary | ICD-10-CM | POA: Diagnosis not present

## 2020-10-18 DIAGNOSIS — N6031 Fibrosclerosis of right breast: Secondary | ICD-10-CM | POA: Insufficient documentation

## 2020-10-18 DIAGNOSIS — R928 Other abnormal and inconclusive findings on diagnostic imaging of breast: Secondary | ICD-10-CM

## 2020-10-18 HISTORY — DX: Type 2 diabetes mellitus without complications: E11.9

## 2020-10-18 HISTORY — DX: Unspecified osteoarthritis, unspecified site: M19.90

## 2020-10-18 HISTORY — DX: Gastrostomy status: Z93.1

## 2020-10-18 HISTORY — DX: Chronic respiratory failure, unspecified whether with hypoxia or hypercapnia: J96.10

## 2020-10-18 HISTORY — PX: XI ROBOTIC ASSISTED VENTRAL HERNIA: SHX6789

## 2020-10-18 HISTORY — PX: BREAST LUMPECTOMY WITH RADIOFREQUENCY TAG IDENTIFICATION: SHX6884

## 2020-10-18 LAB — GLUCOSE, CAPILLARY
Glucose-Capillary: 142 mg/dL — ABNORMAL HIGH (ref 70–99)
Glucose-Capillary: 232 mg/dL — ABNORMAL HIGH (ref 70–99)

## 2020-10-18 SURGERY — BREAST LUMPECTOMY WITH RADIOFREQUENCY TAG IDENTIFICATION
Anesthesia: General | Site: Breast | Laterality: Right

## 2020-10-18 MED ORDER — CELECOXIB 200 MG PO CAPS
ORAL_CAPSULE | ORAL | Status: AC
  Administered 2020-10-18: 200 mg via ORAL
  Filled 2020-10-18: qty 1

## 2020-10-18 MED ORDER — PHENYLEPHRINE HCL (PRESSORS) 10 MG/ML IV SOLN
INTRAVENOUS | Status: DC | PRN
  Administered 2020-10-18: 100 ug via INTRAVENOUS
  Administered 2020-10-18: 200 ug via INTRAVENOUS

## 2020-10-18 MED ORDER — VASOPRESSIN 20 UNIT/ML IV SOLN
INTRAVENOUS | Status: AC
  Filled 2020-10-18: qty 2

## 2020-10-18 MED ORDER — FENTANYL CITRATE (PF) 100 MCG/2ML IJ SOLN
INTRAMUSCULAR | Status: AC
  Filled 2020-10-18: qty 2

## 2020-10-18 MED ORDER — ACETAMINOPHEN 10 MG/ML IV SOLN
INTRAVENOUS | Status: AC
  Administered 2020-10-18: 1000 mg via INTRAVENOUS
  Filled 2020-10-18: qty 100

## 2020-10-18 MED ORDER — CEFAZOLIN SODIUM-DEXTROSE 2-4 GM/100ML-% IV SOLN
INTRAVENOUS | Status: AC
  Filled 2020-10-18: qty 100

## 2020-10-18 MED ORDER — BUPIVACAINE-EPINEPHRINE 0.25% -1:200000 IJ SOLN
INTRAMUSCULAR | Status: DC | PRN
  Administered 2020-10-18: 15 mL

## 2020-10-18 MED ORDER — GABAPENTIN 300 MG PO CAPS
ORAL_CAPSULE | ORAL | Status: AC
  Administered 2020-10-18: 300 mg via ORAL
  Filled 2020-10-18: qty 1

## 2020-10-18 MED ORDER — OXYCODONE HCL 5 MG/5ML PO SOLN
ORAL | Status: AC
  Filled 2020-10-18: qty 5

## 2020-10-18 MED ORDER — ONDANSETRON HCL 4 MG/2ML IJ SOLN
INTRAMUSCULAR | Status: DC | PRN
  Administered 2020-10-18 (×2): 4 mg via INTRAVENOUS

## 2020-10-18 MED ORDER — BUPIVACAINE LIPOSOME 1.3 % IJ SUSP
INTRAMUSCULAR | Status: AC
  Filled 2020-10-18: qty 20

## 2020-10-18 MED ORDER — PHENYLEPHRINE HCL (PRESSORS) 10 MG/ML IV SOLN
INTRAVENOUS | Status: AC
  Filled 2020-10-18: qty 1

## 2020-10-18 MED ORDER — CELECOXIB 200 MG PO CAPS: 200.0000 mg | ORAL_CAPSULE | ORAL | Status: AC

## 2020-10-18 MED ORDER — SODIUM CHLORIDE FLUSH 0.9 % IV SOLN
INTRAVENOUS | Status: AC
  Filled 2020-10-18: qty 10

## 2020-10-18 MED ORDER — EPHEDRINE SULFATE 50 MG/ML IJ SOLN
INTRAMUSCULAR | Status: DC | PRN
  Administered 2020-10-18: 5 mg via INTRAVENOUS

## 2020-10-18 MED ORDER — SUGAMMADEX SODIUM 200 MG/2ML IV SOLN
INTRAVENOUS | Status: DC | PRN
  Administered 2020-10-18: 300 mg via INTRAVENOUS

## 2020-10-18 MED ORDER — CHLORHEXIDINE GLUCONATE 0.12 % MT SOLN: 15.0000 mL | Freq: Once | OROMUCOSAL | Status: DC

## 2020-10-18 MED ORDER — CEFAZOLIN SODIUM-DEXTROSE 2-4 GM/100ML-% IV SOLN
2.0000 g | INTRAVENOUS | Status: AC
  Administered 2020-10-18: 2 g via INTRAVENOUS

## 2020-10-18 MED ORDER — PROPOFOL 500 MG/50ML IV EMUL
INTRAVENOUS | Status: AC
  Filled 2020-10-18: qty 50

## 2020-10-18 MED ORDER — SEVOFLURANE IN SOLN
RESPIRATORY_TRACT | Status: AC
  Filled 2020-10-18: qty 250

## 2020-10-18 MED ORDER — SODIUM CHLORIDE 0.9 % IV SOLN
INTRAVENOUS | Status: DC | PRN
  Administered 2020-10-18: 35 mL

## 2020-10-18 MED ORDER — DEXAMETHASONE SODIUM PHOSPHATE 10 MG/ML IJ SOLN
INTRAMUSCULAR | Status: DC | PRN
  Administered 2020-10-18: 10 mg via INTRAVENOUS

## 2020-10-18 MED ORDER — ACETAMINOPHEN 500 MG PO TABS
ORAL_TABLET | ORAL | Status: AC
  Administered 2020-10-18: 1000 mg via ORAL
  Filled 2020-10-18: qty 2

## 2020-10-18 MED ORDER — ROCURONIUM BROMIDE 100 MG/10ML IV SOLN
INTRAVENOUS | Status: DC | PRN
  Administered 2020-10-18: 50 mg via INTRAVENOUS

## 2020-10-18 MED ORDER — CHLORHEXIDINE GLUCONATE CLOTH 2 % EX PADS: 6.0000 | MEDICATED_PAD | Freq: Once | CUTANEOUS | Status: DC

## 2020-10-18 MED ORDER — PROPOFOL 10 MG/ML IV BOLUS
INTRAVENOUS | Status: DC | PRN
  Administered 2020-10-18: 50 mg via INTRAVENOUS

## 2020-10-18 MED ORDER — 0.9 % SODIUM CHLORIDE (POUR BTL) OPTIME
TOPICAL | Status: DC | PRN
  Administered 2020-10-18: 125 mL

## 2020-10-18 MED ORDER — SODIUM CHLORIDE 0.9 % IV SOLN
INTRAVENOUS | Status: DC | PRN
  Administered 2020-10-18: 20 ug/min via INTRAVENOUS

## 2020-10-18 MED ORDER — ACETAMINOPHEN 10 MG/ML IV SOLN: 1000.0000 mg | Freq: Four times a day (QID) | INTRAVENOUS | Status: DC

## 2020-10-18 MED ORDER — GLYCOPYRROLATE 0.2 MG/ML IJ SOLN
INTRAMUSCULAR | Status: DC | PRN
  Administered 2020-10-18: .2 mg via INTRAVENOUS

## 2020-10-18 MED ORDER — ORAL CARE MOUTH RINSE: 15.0000 mL | Freq: Once | OROMUCOSAL | Status: DC

## 2020-10-18 MED ORDER — FENTANYL CITRATE (PF) 100 MCG/2ML IJ SOLN
INTRAMUSCULAR | Status: AC
  Administered 2020-10-18: 25 ug via INTRAVENOUS
  Filled 2020-10-18: qty 2

## 2020-10-18 MED ORDER — FENTANYL CITRATE (PF) 100 MCG/2ML IJ SOLN
INTRAMUSCULAR | Status: DC | PRN
  Administered 2020-10-18 (×2): 50 ug via INTRAVENOUS
  Administered 2020-10-18: 100 ug via INTRAVENOUS

## 2020-10-18 MED ORDER — BUPIVACAINE-EPINEPHRINE (PF) 0.25% -1:200000 IJ SOLN
INTRAMUSCULAR | Status: AC
  Filled 2020-10-18: qty 30

## 2020-10-18 MED ORDER — GABAPENTIN 300 MG PO CAPS: 300.0000 mg | ORAL_CAPSULE | ORAL | Status: AC

## 2020-10-18 MED ORDER — SODIUM CHLORIDE (PF) 0.9 % IJ SOLN
INTRAMUSCULAR | Status: AC
  Filled 2020-10-18: qty 50

## 2020-10-18 MED ORDER — HYDROCODONE-ACETAMINOPHEN 5-325 MG PO TABS: 1.0000 | ORAL_TABLET | Freq: Four times a day (QID) | ORAL | 0 refills | Status: DC | PRN

## 2020-10-18 MED ORDER — SODIUM CHLORIDE 0.9 % IV SOLN: INTRAVENOUS | Status: DC

## 2020-10-18 MED ORDER — FENTANYL CITRATE (PF) 100 MCG/2ML IJ SOLN
25.0000 ug | INTRAMUSCULAR | Status: DC | PRN
  Administered 2020-10-18 (×3): 25 ug via INTRAVENOUS

## 2020-10-18 MED ORDER — ACETAMINOPHEN 500 MG PO TABS: 1000.0000 mg | ORAL_TABLET | ORAL | Status: AC

## 2020-10-18 SURGICAL SUPPLY — 87 items
ADH SKN CLS APL DERMABOND .7 (GAUZE/BANDAGES/DRESSINGS) ×4
APL PRP STRL LF DISP 70% ISPRP (MISCELLANEOUS) ×6
APPLIER CLIP 9.375 SM OPEN (CLIP)
APR CLP SM 9.3 20 MLT OPN (CLIP)
BLADE SURG 15 STRL LF DISP TIS (BLADE) ×8 IMPLANT
BLADE SURG 15 STRL SS (BLADE) ×12
CANISTER SUCT 1200ML W/VALVE (MISCELLANEOUS) IMPLANT
CANNULA REDUC XI 12-8 STAPL (CANNULA) ×1
CANNULA REDUCER 12-8 DVNC XI (CANNULA) ×2 IMPLANT
CHLORAPREP W/TINT 26 (MISCELLANEOUS) ×9 IMPLANT
CLIP APPLIE 9.375 SM OPEN (CLIP) IMPLANT
CNTNR SPEC 2.5X3XGRAD LEK (MISCELLANEOUS)
CONT SPEC 4OZ STER OR WHT (MISCELLANEOUS)
CONT SPEC 4OZ STRL OR WHT (MISCELLANEOUS)
CONTAINER SPEC 2.5X3XGRAD LEK (MISCELLANEOUS) IMPLANT
COVER TIP SHEARS 8 DVNC (MISCELLANEOUS) ×2 IMPLANT
COVER TIP SHEARS 8MM DA VINCI (MISCELLANEOUS) ×1
DEFOGGER SCOPE WARMER CLEARIFY (MISCELLANEOUS) ×3 IMPLANT
DERMABOND ADVANCED (GAUZE/BANDAGES/DRESSINGS) ×2
DERMABOND ADVANCED .7 DNX12 (GAUZE/BANDAGES/DRESSINGS) ×4 IMPLANT
DEVICE DUBIN SPECIMEN MAMMOGRA (MISCELLANEOUS) ×3 IMPLANT
DRAPE 3/4 80X56 (DRAPES) ×3 IMPLANT
DRAPE ARM DVNC X/XI (DISPOSABLE) ×8 IMPLANT
DRAPE COLUMN DVNC XI (DISPOSABLE) ×2 IMPLANT
DRAPE DA VINCI XI ARM (DISPOSABLE) ×4
DRAPE DA VINCI XI COLUMN (DISPOSABLE) ×1
DRAPE LAPAROTOMY TRNSV 106X77 (MISCELLANEOUS) ×3 IMPLANT
ELECT CAUTERY BLADE 6.4 (BLADE) ×3 IMPLANT
ELECT REM PT RETURN 9FT ADLT (ELECTROSURGICAL) ×3
ELECTRODE REM PT RTRN 9FT ADLT (ELECTROSURGICAL) ×2 IMPLANT
GAUZE 4X4 16PLY ~~LOC~~+RFID DBL (SPONGE) IMPLANT
GLOVE SURG ENC MOIS LTX SZ7 (GLOVE) ×6 IMPLANT
GOWN STRL REUS W/ TWL LRG LVL3 (GOWN DISPOSABLE) ×6 IMPLANT
GOWN STRL REUS W/TWL LRG LVL3 (GOWN DISPOSABLE) ×9
GRASPER SUT TROCAR 14GX15 (MISCELLANEOUS) ×3 IMPLANT
HANDLE YANKAUER SUCT BULB TIP (MISCELLANEOUS) ×3 IMPLANT
IRRIGATION STRYKERFLOW (MISCELLANEOUS) IMPLANT
IRRIGATOR STRYKERFLOW (MISCELLANEOUS)
IV NS 1000ML (IV SOLUTION)
IV NS 1000ML BAXH (IV SOLUTION) IMPLANT
KIT MARKER MARGIN INK (KITS) ×3 IMPLANT
KIT PINK PAD W/HEAD ARE REST (MISCELLANEOUS) ×3
KIT PINK PAD W/HEAD ARM REST (MISCELLANEOUS) ×2 IMPLANT
KIT TURNOVER KIT A (KITS) ×3 IMPLANT
LABEL OR SOLS (LABEL) ×3 IMPLANT
MANIFOLD NEPTUNE II (INSTRUMENTS) ×3 IMPLANT
MARGIN MAP 10MM (MISCELLANEOUS) IMPLANT
MARKER MARGIN CORRECT CLIP (MARKER) IMPLANT
MESH VENTRALIGHT ST 4X6IN (Mesh General) ×3 IMPLANT
NEEDLE FILTER BLUNT 18X 1/2SAF (NEEDLE) ×1
NEEDLE FILTER BLUNT 18X1 1/2 (NEEDLE) ×2 IMPLANT
NEEDLE HYPO 22GX1.5 SAFETY (NEEDLE) ×3 IMPLANT
NEEDLE INSUFFLATION 14GA 120MM (NEEDLE) ×3 IMPLANT
OBTURATOR OPTICAL STANDARD 8MM (TROCAR) ×1
OBTURATOR OPTICAL STND 8 DVNC (TROCAR) ×2
OBTURATOR OPTICALSTD 8 DVNC (TROCAR) ×2 IMPLANT
PACK BASIN MINOR ARMC (MISCELLANEOUS) ×3 IMPLANT
PACK LAP CHOLECYSTECTOMY (MISCELLANEOUS) ×3 IMPLANT
PENCIL ELECTRO HAND CTR (MISCELLANEOUS) ×6 IMPLANT
SEAL CANN UNIV 5-8 DVNC XI (MISCELLANEOUS) ×6 IMPLANT
SEAL XI 5MM-8MM UNIVERSAL (MISCELLANEOUS) ×3
SET LOCALIZER 20 PROBE US (MISCELLANEOUS) ×3 IMPLANT
SET TUBE SMOKE EVAC HIGH FLOW (TUBING) ×3 IMPLANT
SLEVE PROBE SENORX GAMMA FIND (MISCELLANEOUS) IMPLANT
SOLUTION ELECTROLUBE (MISCELLANEOUS) ×3 IMPLANT
SPONGE KITTNER 5P (MISCELLANEOUS) IMPLANT
SPONGE T-LAP 18X18 ~~LOC~~+RFID (SPONGE) ×6 IMPLANT
STAPLER CANNULA SEAL DVNC XI (STAPLE) ×2 IMPLANT
STAPLER CANNULA SEAL XI (STAPLE) ×1
SUT MNCRL 4-0 (SUTURE) ×6
SUT MNCRL 4-0 27XMFL (SUTURE) ×4
SUT SILK 2 0 SH (SUTURE) IMPLANT
SUT STRATAFIX PDS 30 CT-1 (SUTURE) ×6 IMPLANT
SUT VIC AB 2-0 SH 27 (SUTURE) ×3
SUT VIC AB 2-0 SH 27XBRD (SUTURE) ×2 IMPLANT
SUT VIC AB 3-0 SH 27 (SUTURE) ×6
SUT VIC AB 3-0 SH 27X BRD (SUTURE) ×4 IMPLANT
SUT VICRYL 0 AB UR-6 (SUTURE) ×6 IMPLANT
SUT VLOC 90 2/L VL 12 GS22 (SUTURE) ×6 IMPLANT
SUTURE MNCRL 4-0 27XMF (SUTURE) ×4 IMPLANT
SYR 10ML LL (SYRINGE) ×3 IMPLANT
SYR 20ML LL LF (SYRINGE) ×3 IMPLANT
TAPE TRANSPORE STRL 2 31045 (GAUZE/BANDAGES/DRESSINGS) ×3 IMPLANT
TRAP NEPTUNE SPECIMEN COLLECT (MISCELLANEOUS) ×3 IMPLANT
TROCAR BALLN GELPORT 12X130M (ENDOMECHANICALS) ×3 IMPLANT
WATER STERILE IRR 1000ML POUR (IV SOLUTION) ×3 IMPLANT
WATER STERILE IRR 500ML POUR (IV SOLUTION) ×3 IMPLANT

## 2020-10-18 NOTE — Interval H&P Note (Signed)
History and Physical Interval Note:  10/18/2020 1:06 PM  Angela David  has presented today for surgery, with the diagnosis of right breast mass.  The various methods of treatment have been discussed with the patient and family. After consideration of risks, benefits and other options for treatment, the patient has consented to  Procedure(s): BREAST LUMPECTOMY WITH RADIOFREQUENCY TAG IDENTIFICATION (Right) XI ROBOTIC ASSISTED VENTRAL HERNIA (N/A) as a surgical intervention.  The patient's history has been reviewed, patient examined, no change in status, stable for surgery.  I have reviewed the patient's chart and labs.  Questions were answered to the patient's satisfaction.     [place]

## 2020-10-18 NOTE — Anesthesia Postprocedure Evaluation (Signed)
Anesthesia Post Note  Patient: Angela David  Procedure(s) Performed: BREAST LUMPECTOMY WITH RADIOFREQUENCY TAG IDENTIFICATION (Right: Breast) XI ROBOTIC ASSISTED VENTRAL HERNIA  Patient location during evaluation: PACU Anesthesia Type: General Level of consciousness: awake and alert Pain management: pain level controlled Vital Signs Assessment: post-procedure vital signs reviewed and stable Respiratory status: spontaneous breathing, nonlabored ventilation, respiratory function stable and patient connected to tracheostomy mask oxygen Cardiovascular status: blood pressure returned to baseline and stable Postop Assessment: no apparent nausea or vomiting Anesthetic complications: no   No notable events documented.   Last Vitals:  Vitals:   10/18/20 1830 10/18/20 1838  BP:    Pulse: (!) 112 (!) 118  Resp: 16 17  Temp:    SpO2: 96% 96%    Last Pain:  Vitals:   10/18/20 1838  TempSrc:   PainSc: 7                  Arita Miss

## 2020-10-18 NOTE — Transfer of Care (Signed)
Immediate Anesthesia Transfer of Care Note  Patient: Angela David  Procedure(s) Performed: BREAST LUMPECTOMY WITH RADIOFREQUENCY TAG IDENTIFICATION (Right: Breast) XI ROBOTIC ASSISTED VENTRAL HERNIA  Patient Location: PACU  Anesthesia Type:General  Level of Consciousness: awake, drowsy and patient cooperative  Airway & Oxygen Therapy: Patient Spontanous Breathing and Patient connected to tracheostomy mask oxygen  Post-op Assessment: Report given to RN and Post -op Vital signs reviewed and stable  Post vital signs: Reviewed and stable  Last Vitals:  Vitals Value Taken Time  BP 149/77 10/18/20 1800  Temp    Pulse 110 10/18/20 1807  Resp 14 10/18/20 1807  SpO2 100 % 10/18/20 1807  Vitals shown include unvalidated device data.  Last Pain:  Vitals:   10/18/20 1151  TempSrc: Temporal  PainSc: 0-No pain         Complications: No notable events documented.

## 2020-10-18 NOTE — Op Note (Addendum)
Pre-operative Diagnosis: HX of Right Breast CA , New Right outer upper quadrant lesion with discordant pathology Recurrent Incisional hernia     Post-operative Diagnosis: Same   Surgeon: Caroleen Hamman,  MD FACS  Anesthesia: GETA  Procedure: Right Partial mastectomy, RF Tag guided,  Complex closure with tissue rearrangements measuring 64cm2 Robotic assisted laparoscopic incisional hernia repair using round Ventralight ST BARD mesh 4x6 inches   Findings: Biopsy Clip and lesion within xray specimen 5 cm incisional hernia   Estimated Blood Loss: 10cc         Drains: None         Specimens: partial mastectomy with labels, Re-excision lateral margin of lumpectomy       Complications: none                 Condition: Stable    Procedure Details  The patient was seen again in the Holding Room. The benefits, complications, treatment options, and expected outcomes were discussed with the patient. The risks of bleeding, infection, recurrence of symptoms, failure to resolve symptoms, hematoma, seroma, open wound, cosmetic deformity, and the need for further surgery were discussed.  The patient was taken to Operating Room, identified  and the procedure verified.  A Time Out was held and the above information confirmed.  Prior to the induction of general anesthesia, antibiotic prophylaxis was administered. VTE prophylaxis was in place. Appropriate anesthesia was then administered and tolerated well. The chest was prepped with Chloraprep and draped in the sterile fashion. The patient was positioned in the supine position.   Using the RF Tag scanner an area of high counts was identified in upper outer quadrant 9 and 10 oclock. Incision was made  in a crescent fashion, the incision was directed based on the location of the RF tag.  I performed a  partial mastectomy with adequate margins using electrocautery and keeping the RF tag in the middle at all times. A good specimen was resected and the  deep margin was the pectoralis muscle. Marland Kitchen Hemostasis was with electrocautery. THe specimen was painted in the appropriate fashion and Faxitron images of the specimen were obtained. The clip and RF tag was seen on the images, there were some persistent calcification on the lateral and inferior borders. Given those findings I decided to r-excise the lateral and inferior margins in the standard fashion and sent the re-excision tissue for permanent pathology.   Tissue advancement flaps were performed to decrease the volume deficit in the area of the resection.  Please note that the total void area was 8x 8 cm equals 64 cm.  The chest wall flaps were created by incising the breast parenchyma from the pectoralis fascia in a circumferential method.  The breast parenchyma was then reapproximated in a deep to superficial fashion using interrupted 2-0 Vicryl sutures.  Please note that I placed 2 deep layers of 2-0 Vicryl's.  Once assuring that hemostasis was adequate and checked multiple times the wound was closed with 4-0 subcuticular Monocryl sutures. Dermabond was placed  Attention was turned the right upper quadrant the abdominal cavity.  We broke prepped and draped in the usual sterile fashion with sterile tray and a fresh instruments.  We included the G-tube within her prep.  Using the veres needle I Selected the RUQ to enter the abdominal cavity, 2 clicks w were heard and the saline drop test was positive.  Appropriate pressures were observed and good pneumoperitoneum was obtained.  There was no evidence of hemodynamic changes.  At  This point I placed an 8 mm incision in the right lower quadrant and did an Optiview axis to the abdominal cavity.  This was done under direct visualization in the standard fashion.  There was no evidence of any bowel injuries.  2 additional 8 mm ports were placed on the right aspect of the abdominal cavity under direct visualization. The robot was brought to the sterile field and  docked in the standard fashion.  We made sure there was no evidence of any lesion between arms and that all instrumentation was kept Under direct view at all times Inspection in with abdominal cavity revealed a 5 cm incisional hernia.  I went ahead and inserted the ventralight ST mesh measuring 15.2 x 10.2 cm.  There was a tongue of omentum that was lysed from the hernia sac in the standard fashion. Fascial defect was closed with a 0 strata fix suture.  The mesh was attached to the abdominal cavity using the double crown technique and with 2 oh V-Loc suture.  There was very good coverage and a tension-free repair.  Also visualize the G-tube and there was no evidence of any injuries to the stomach or any intra-abdominal structures. All the robotic ports were removed from the abdominal cavity. Was able to undocked the robot and deflate the abdominal cavity.  Exparel was infiltrated at all incision sites.  Wounds were closed in a subcuticular fashion with 4-0 Monocryl and coated with Dermabond    Patient was taken to the recovery room in stable condition.   Caroleen Hamman , MD, FACS

## 2020-10-18 NOTE — Anesthesia Preprocedure Evaluation (Signed)
Anesthesia Evaluation  Patient identified by MRN, date of birth, ID band Patient awake    Reviewed: Allergy & Precautions, NPO status , Patient's Chart, lab work & pertinent test results  History of Anesthesia Complications Negative for: history of anesthetic complications  Airway Mallampati: Trach  TM Distance: >3 FB Neck ROM: limited    Dental  (+) Chipped   Pulmonary  Shiley 6.0 Cuffed Trach   Pulmonary exam normal        Cardiovascular +CHF  negative cardio ROS Normal cardiovascular exam     Neuro/Psych  Headaches, PSYCHIATRIC DISORDERS  Neuromuscular disease negative psych ROS   GI/Hepatic Neg liver ROS, GERD  Medicated and Controlled,  Endo/Other  diabetes, Type 2  Renal/GU      Musculoskeletal  (+) Arthritis ,   Abdominal   Peds  Hematology negative hematology ROS (+)   Anesthesia Other Findings Past Medical History: 2002: Breast cancer, right ([place])     Comment:  Tx'd with chemotherapy and XRT No date: Chronic respiratory failure (HCC)     Comment:  s/p tracheostomy in 2012; on ventilatory support at hs 08/28/2016: Diabetic peripheral neuropathy (HCC) No date: GERD (gastroesophageal reflux disease) No date: Glaucoma No date: High cholesterol No date: History of colon polyps No date: Osteoarthritis 2002: Personal history of chemotherapy     Comment:  BREAST CA 2002: Personal history of radiation therapy     Comment:  BREAST CA No date: S/P percutaneous endoscopic gastrostomy (PEG) tube placement  ([place]) No date: T2DM (type 2 diabetes mellitus) ([place]) 2012: Tracheostomy dependent ([place]) No date: Ventral hernia 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  Past Surgical History: No date: ABDOMINAL HYSTERECTOMY 2008: BREAST BIOPSY; Right     Comment:  neg 06/30/2020: BREAST BIOPSY; Right     Comment:  "Q" clip-path pending 2002: BREAST EXCISIONAL BIOPSY; Right     Comment:  positive 2002: BREAST LUMPECTOMY;  Right     Comment:  chemo and radiation 2002: BREAST SURGERY; Right     Comment:  LUMPECTOMY, radiation, chemotherapy No date: CATARACT EXTRACTION, BILATERAL 12/10/2017: CHOLECYSTECTOMY; N/A     Comment:  Procedure: LAPAROSCOPIC CHOLECYSTECTOMY;  Surgeon:               Benjamine Sprague, DO;  Location: [facility] ORS;  Service: General;              Laterality: N/A; 11/06/2018: COLON RESECTION; Right     Comment:  Procedure: HAND ASSISTED LAPAROSCOPIC COLON RESECTION,               RIGHT;  Surgeon: Jules Husbands, MD;  Location: [facility] ORS;              Service: General;  Laterality: Right; 2009,12/30/2013: COLONOSCOPY 04/16/2018: COLONOSCOPY WITH PROPOFOL; N/A     Comment:  Tubulovillous polyp proximal transverse colon, large               sessile polyp, proximal transverse colon;  Surgeon:               Robert Bellow, MD;  Location: [facility] ENDOSCOPY;                Service: Endoscopy;  Laterality: N/A; 10/14/2018: ESOPHAGOGASTRODUODENOSCOPY (EGD) WITH PROPOFOL; N/A     Comment:  Procedure: ESOPHAGOGASTRODUODENOSCOPY (EGD) WITH               PROPOFOL;  Surgeon: Lucilla Lame, MD;  Location: [facility]  ENDOSCOPY;  Service: Endoscopy;  Laterality: N/A; 2013: TRACHEAL SURGERY No date: TRACHEOSTOMY; N/A     Comment:  Dr Kathyrn Sheriff  BMI    Body Mass Index: 27.13 kg/m      Reproductive/Obstetrics negative OB ROS                             Anesthesia Physical Anesthesia Plan  ASA: 3  Anesthesia Plan: General   Post-op Pain Management:    Induction: Intravenous  PONV Risk Score and Plan: Ondansetron, Dexamethasone, Midazolam and Treatment may vary due to age or medical condition  Airway Management Planned: Oral ETT  Additional Equipment:   Intra-op Plan:   Post-operative Plan: Extubation in OR  Informed Consent: I have reviewed the patients History and Physical, chart, labs and discussed the procedure including the risks, benefits and alternatives  for the proposed anesthesia with the patient or authorized representative who has indicated his/her understanding and acceptance.     Dental Advisory Given  Plan Discussed with: Anesthesiologist, CRNA and Surgeon  Anesthesia Plan Comments: (Patient consented for risks of anesthesia including but not limited to:  - adverse reactions to medications - damage to eyes, teeth, lips or other oral mucosa - nerve damage due to positioning  - sore throat or hoarseness - Damage to heart, brain, nerves, lungs, other parts of body or loss of life  Patient voiced understanding.)        Anesthesia Quick Evaluation

## 2020-10-19 ENCOUNTER — Ambulatory Visit (INDEPENDENT_AMBULATORY_CARE_PROVIDER_SITE_OTHER): Payer: Self-pay | Admitting: Urology

## 2020-10-19 ENCOUNTER — Telehealth: Payer: Self-pay | Admitting: Surgery

## 2020-10-19 ENCOUNTER — Other Ambulatory Visit: Payer: Self-pay

## 2020-10-19 ENCOUNTER — Encounter: Payer: Self-pay | Admitting: Surgery

## 2020-10-19 ENCOUNTER — Encounter: Payer: Self-pay | Admitting: Urology

## 2020-10-19 ENCOUNTER — Ambulatory Visit (INDEPENDENT_AMBULATORY_CARE_PROVIDER_SITE_OTHER): Payer: Medicare Other | Admitting: Surgery

## 2020-10-19 VITALS — BP 115/67 | HR 82 | Ht 64.0 in | Wt 158.0 lb

## 2020-10-19 VITALS — BP 120/69 | HR 83 | Temp 99.4°F | Ht 64.0 in | Wt 158.0 lb

## 2020-10-19 DIAGNOSIS — Z09 Encounter for follow-up examination after completed treatment for conditions other than malignant neoplasm: Secondary | ICD-10-CM

## 2020-10-19 DIAGNOSIS — R339 Retention of urine, unspecified: Secondary | ICD-10-CM

## 2020-10-19 LAB — BLADDER SCAN AMB NON-IMAGING: Scan Result: >226

## 2020-10-19 NOTE — Progress Notes (Signed)
POD # 1 s/p right lumpectomy and robotic ventral hernia Doing well except suprapubic pai and urinary retention Taking PO, no fevers  PE NAD Breast healing well, no infection or hematoma Abdf: soft, incisional healing well, suprapubic pain.    A/ purinary retention Urgent referral to GU for foley placement No need for urgent surgical intervention Keep scheduled f/u appt

## 2020-10-19 NOTE — Telephone Encounter (Signed)
Patients husband is calling and said his wife has not been able to urinate since surgery yesterday and said she has drink plenty of water and patient also went just a couple drops this morning. Please call patient and advise.

## 2020-10-19 NOTE — Patient Instructions (Signed)
Indwelling Urinary Catheter Care, Adult An indwelling urinary catheter is a thin tube that is put into your bladder. The tube helps to drain pee (urine) out of your body. The tube goes in through your urethra. Your urethra is where pee comes out of your body. Your pee will come out through the catheter, then it will go into a bag (drainage bag). Take good care of your catheter so it will work well. How to wear your catheter and bag Supplies needed Sticky tape (adhesive tape) or a leg strap. Alcohol wipe or soap and water (if you use tape). A clean towel (if you use tape). Large overnight bag. Smaller bag (leg bag). Wearing your catheter Attach your catheter to your leg with tape or a leg strap. Make sure the catheter is not pulled tight. If a leg strap gets wet, take it off and put on a dry strap. If you use tape to hold the bag on your leg: Use an alcohol wipe or soap and water to wash your skin where the tape made it sticky before. Use a clean towel to pat-dry that skin. Use new tape to make the bag stay on your leg. Wearing your bags You should have been given a large overnight bag. You may wear the overnight bag in the day or night. Always have the overnight bag lower than your bladder.  Do not let the bag touch the floor. Before you go to sleep, put a clean plastic bag in a wastebasket. Then hang the overnight bag inside the wastebasket. You should also have a smaller leg bag that fits under your clothes. Always wear the leg bag below your knee. Do not wear your leg bag at night. How to care for your skin and catheter Supplies needed A clean washcloth. Water and mild soap. A clean towel. Caring for your skin and catheter     Clean the skin around your catheter every day: Wash your hands with soap and water. Wet a clean washcloth in warm water and mild soap. Clean the skin around your urethra. If you are female: Gently spread the folds of skin around your vagina  (labia). With the washcloth in your other hand, wipe the inner side of your labia on each side. Wipe from front to back. If you are female: Pull back any skin that covers the end of your penis (foreskin). With the washcloth in your other hand, wipe your penis in small circles. Start wiping at the tip of your penis, then move away from the catheter. Move the foreskin back in place, if needed. With your free hand, hold the catheter close to where it goes into your body. Keep holding the catheter during cleaning so it does not get pulled out. With the washcloth in your other hand, clean the catheter. Only wipe downward on the catheter, toward the drainage bag. Do not wipe upward toward your body. Doing this may push germs into your urethra and cause infection. Use a clean towel to pat-dry the catheter and the skin around it. Make sure to wipe off all soap. Wash your hands with soap and water. Shower every day. Do not take baths. Do not use cream, ointment, or lotion on the area where the catheter goes into your body, unless your doctor tells you to. Do not use powders, sprays, or lotions on your genital area. Check your skin around the catheter every day for signs of infection. Check for: Redness, swelling, or pain. Fluid or blood. Warmth. Pus   or a bad smell. How to empty the bag Supplies needed Rubbing alcohol. Gauze pad or cotton ball. Tape or a leg strap. Emptying the bag Pour the pee out of your bag when it is ?- full, or at least 2-3 times a day. Do this for your overnight bag and your leg bag. Wash your hands with soap and water. Separate (detach) the bag from your leg. Hold the bag over the toilet or a clean pail. Keep the bag lower than your hips and bladder. This is so the pee (urine) does not go back into the tube. Open the pour spout. It is at the bottom of the bag. Empty the pee into the toilet or pail. Do not let the pour spout touch any surface. Put rubbing alcohol on a  gauze pad or cotton ball. Use the gauze pad or cotton ball to clean the pour spout. Close the pour spout. Attach the bag to your leg with tape or a leg strap. Wash your hands with soap and water. Follow instructions for cleaning the drainage bag: From the product maker. As told by your doctor. How to change the bag Changing the bag Replace your bag when it starts to leak, smell bad, or look dirty. Wash your hands with soap and water. Separate the dirty bag from your leg. Pinch the catheter with your fingers so that pee does not spill out. Separate the catheter tube from the bag tube where these tubes connect (at the connection valve). Do not let the tubes touch any surface. Clean the end of the catheter tube with an alcohol wipe. Use a different alcohol wipe to clean the end of the bag tube. Connect the catheter tube to the tube of the clean bag. Attach the clean bag to your leg with tape or a leg strap. Do not make the bag tight on your leg. Wash your hands with soap and water. General rules  Never pull on your catheter. Never try to take it out. Doing that can hurt you. Always wash your hands before and after you touch your catheter or bag. Use a mild, fragrance-free soap. If you do not have soap and water, use hand sanitizer. Always make sure there are no twists or bends (kinks) in the catheter tube. Always make sure there are no leaks in the catheter or bag. Drink enough fluid to keep your pee pale yellow. Do not take baths, swim, or use a hot tub. If you are female, wipe from front to back after you poop (have a bowel movement). Contact a doctor if: Your pee is cloudy. Your pee smells worse than usual. Your catheter gets clogged. Your catheter leaks. Your bladder feels full. Get help right away if: You have redness, swelling, or pain where the catheter goes into your body. You have fluid, blood, pus, or a bad smell coming from the area where the catheter goes into your  body. Your skin feels warm where the catheter goes into your body. You have a fever. You have pain in your: Belly (abdomen). Legs. Lower back. Bladder. You see blood in the catheter. Your pee is pink or red. You feel sick to your stomach (nauseous). You throw up (vomit). You have chills. Your pee is not draining into the bag. Your catheter gets pulled out. Summary An indwelling urinary catheter is a thin tube that is placed into the bladder to help drain pee (urine) out of the body. The catheter is placed into the part of the body   that drains pee from the bladder (urethra). Taking good care of your catheter will keep it working properly and help prevent problems. Always wash your hands before and after touching your catheter or bag. Never pull on your catheter or try to take it out. This information is not intended to replace advice given to you by your health care provider. Make sure you discuss any questions you have with your healthcare provider. Document Revised: 01/29/2020 Document Reviewed: 01/29/2020 Elsevier Patient Education  2022 [employer]      

## 2020-10-19 NOTE — Progress Notes (Signed)
10/19/20 2:33 PM   Angela David [birth year] [id number]  Referring provider:  Iva Boop, MD [place],  [place] [id number] Chief Complaint  Patient presents with   Urinary Retention     HPI: Angela David is a 80 y.o.female who returns today for further evaluation of urinary retention post surgery.   Sent over urgently from Dr. Caroleen Hamman for foley catheter placement.  She underwent right partial mastectomy and robotic ventral hernia repair on 10/18/2020.   She has not urinated in 24 hours and has not had a a bowel movement in 2 days.  She is very uncomfortable with a strong urge to urinate but unable to do so.  She is accompanied today by her husband.  No personal history of urinary retention.  Notably, she has multiple medical comorbidities including personal history of chronic respiratory failure status post tracheostomy, G-tube diabetes and breast cancer as outlined above.   PMH: Past Medical History:  Diagnosis Date   Breast cancer, right ([place]) 2002   Tx'd with chemotherapy and XRT   Chronic respiratory failure ([place])    s/p tracheostomy in 2012; on ventilatory support at hs   Diabetic peripheral neuropathy ([place]) 08/28/2016   GERD (gastroesophageal reflux disease)    Glaucoma    High cholesterol    History of colon polyps    Osteoarthritis    Personal history of chemotherapy 2002   BREAST CA   Personal history of radiation therapy 2002   BREAST CA   S/P percutaneous endoscopic gastrostomy (PEG) tube placement ([facility])    T2DM (type 2 diabetes mellitus) ([place])    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   Ventral hernia    Vocal cord paralysis 2012    Surgical History: Past Surgical History:  Procedure Laterality Date   ABDOMINAL HYSTERECTOMY     BREAST BIOPSY Right 2008   neg   BREAST BIOPSY Right 06/30/2020   "Q" clip-path pending   BREAST EXCISIONAL BIOPSY Right 2002   positive   BREAST LUMPECTOMY Right 2002    chemo and radiation   BREAST LUMPECTOMY WITH RADIOFREQUENCY TAG IDENTIFICATION Right 10/18/2020   Procedure: BREAST LUMPECTOMY WITH RADIOFREQUENCY TAG IDENTIFICATION;  Surgeon: Jules Husbands, MD;  Location: [facility] ORS;  Service: General;  Laterality: Right;   BREAST SURGERY Right 2002   LUMPECTOMY, radiation, chemotherapy   CATARACT EXTRACTION, BILATERAL     CHOLECYSTECTOMY N/A 12/10/2017   Procedure: LAPAROSCOPIC CHOLECYSTECTOMY;  Surgeon: Benjamine Sprague, DO;  Location: [facility];  Service: General;  Laterality: N/A;   COLON RESECTION Right 11/06/2018   Procedure: HAND ASSISTED LAPAROSCOPIC COLON RESECTION, RIGHT;  Surgeon: Jules Husbands, MD;  Location: [facility];  Service: General;  Laterality: Right;   COLONOSCOPY  2009,12/30/2013   COLONOSCOPY WITH PROPOFOL N/A 04/16/2018   Tubulovillous polyp proximal transverse colon, large sessile polyp, proximal transverse colon;  Surgeon: Robert Bellow, MD;  Location: [facility];  Service: Endoscopy;  Laterality: N/A;   ESOPHAGOGASTRODUODENOSCOPY (EGD) WITH PROPOFOL N/A 10/14/2018   Procedure: ESOPHAGOGASTRODUODENOSCOPY (EGD) WITH PROPOFOL;  Surgeon: Lucilla Lame, MD;  Location: [facility];  Service: Endoscopy;  Laterality: N/A;   TRACHEAL SURGERY  2013   TRACHEOSTOMY N/A    Dr Kathyrn Sheriff   XI ROBOTIC ASSISTED VENTRAL HERNIA N/A 10/18/2020   Procedure: XI ROBOTIC ASSISTED VENTRAL HERNIA;  Surgeon: Jules Husbands, MD;  Location: [facility];  Service: General;  Laterality: N/A;    Home Medications:  Allergies as of 10/19/2020  Reactions   Shellfish Allergy Shortness Of Breath   respitatory distress    Sodium Sulfite Shortness Of Breath   Severe Congestion that creates inability to breath   Sulfa Antibiotics Shortness Of Breath, Other (See Comments)   respitatory distress   Codeine Other (See Comments)   Per patient "it kept her up all night"        Medication List        Accurate as of October 19, 2020  2:33 PM. If you have any  questions, ask your nurse or doctor.          acetaminophen 500 MG tablet Commonly known as: TYLENOL Take 1,000 mg by mouth in the morning and at bedtime.   busPIRone 15 MG tablet Commonly known as: BUSPAR Place 15 mg into feeding tube 2 (two) times daily.   carboxymethylcellulose 0.5 % Soln Commonly known as: REFRESH PLUS Place 1 drop into both eyes 3 (three) times daily as needed (dry eyes).   denosumab 60 MG/ML Sosy injection Commonly known as: PROLIA Inject 60 mg into the skin every 6 (six) months.   escitalopram 20 MG tablet Commonly known as: LEXAPRO Place 20 mg into feeding tube daily. What changed: Another medication with the same name was changed. Make sure you understand how and when to take each.   escitalopram 10 MG tablet Commonly known as: LEXAPRO Place 3 tablets (30 mg total) into feeding tube daily. What changed: how much to take   fluconazole 150 MG tablet Commonly known as: DIFLUCAN Place 150 mg into feeding tube once a week.   folic acid 1 MG tablet Commonly known as: FOLVITE Place 1 tablet (1 mg total) into feeding tube daily.   gabapentin 600 MG tablet Commonly known as: NEURONTIN Place 600 mg into feeding tube 3 (three) times daily.   Hair/Skin/Nails Tabs Take 1 tablet by mouth daily.   HYDROcodone-acetaminophen 5-325 MG tablet Commonly known as: NORCO/VICODIN Take 1-2 tablets by mouth every 6 (six) hours as needed for moderate pain.   lovastatin 20 MG tablet Commonly known as: MEVACOR Place 20 mg into feeding tube daily.   melatonin 5 MG Tabs Place 1 tablet (5 mg total) into feeding tube at bedtime.   metFORMIN 1000 MG tablet Commonly known as: GLUCOPHAGE Place 1,000 mg into feeding tube daily with breakfast.   metoprolol tartrate 25 MG tablet Commonly known as: LOPRESSOR Place 25 mg into feeding tube daily.   pantoprazole 40 MG tablet Commonly known as: PROTONIX Take 40 mg by mouth daily.   rOPINIRole 0.5 MG  tablet Commonly known as: REQUIP Place 1 tablet (0.5 mg total) into feeding tube at bedtime.        Allergies:  Allergies  Allergen Reactions   Shellfish Allergy Shortness Of Breath    respitatory distress    Sodium Sulfite Shortness Of Breath    Severe Congestion that creates inability to breath   Sulfa Antibiotics Shortness Of Breath and Other (See Comments)    respitatory distress   Codeine Other (See Comments)    Per patient "it kept her up all night"    Family History: Family History  Problem Relation Age of Onset   Cancer Father        bone cancer   Cancer Sister        lymphoma   Cancer Brother    Breast cancer Neg Hx     Social History:  reports that she has never smoked. She has never used smokeless tobacco. She  reports that she does not drink alcohol and does not use drugs.   Physical Exam: BP 115/67   Pulse 82   Ht '5\' 4"'$  (1.626 m)   Wt 158 lb (71.7 kg)   BMI 27.12 kg/m   Constitutional:  Alert and oriented, No acute distress.She has trach and peg tube. HEENT: [place] AT, moist mucus membranes.  Trachea midline, no masses. Cardiovascular: No clubbing, cyanosis, or edema. Respiratory: Normal respiratory effort, no increased work of breathing. GU:  Had mid bilateral extremity edema  Skin: No rashes, bruises or suspicious lesions. Neurologic: Grossly intact, no focal deficits, moving all 4 extremities. Psychiatric: Normal mood and affect.  Laboratory Data:  Lab Results  Component Value Date   CREATININE 0.95 06/23/2020    Lab Results  Component Value Date   HGBA1C 6.3 (H) 01/20/2020    Pertinent imaging:  Results for orders placed or performed in visit on 10/19/20  BLADDER SCAN AMB NON-IMAGING  Result Value Ref Range   Scan Result >226 ml      Assessment & Plan:    Urinary retention  - PVR to >226 mL, uncomfortable and unable to void x24 hours - Catheter placed and immediate urinary relief  - Removal of catheter next week  - Urine was  concentrated and recommended increasing liquid intake.    Follow-up Monday for catheter removal.  [facility] [address], [place], [place] [address] [phone]

## 2020-10-19 NOTE — Patient Instructions (Addendum)
Please go directly to [facility]. Please keep your scheduled appointment with Dr.Pabon.

## 2020-10-19 NOTE — Progress Notes (Signed)
Simple Catheter Placement  Due to urinary retention patient is present today for a foley cath placement.  Patient was cleaned and prepped in a sterile fashion with betadine. A 16 FR foley catheter was inserted, urine return was noted  300 ml, urine was dark yellow in color.  The balloon was filled with 10cc of sterile water.  A leg bag was attached for drainage. Patient was also given a night bag to take home and was given instruction on how to change from one bag to another.  Patient was given instruction on proper catheter care.  Patient tolerated well, no complications were noted   Performed by: Kerman Passey, RMA  Additional notes/ Follow up: Monday for Fill and pull

## 2020-10-20 LAB — SURGICAL PATHOLOGY

## 2020-10-21 ENCOUNTER — Emergency Department: Payer: Medicare Other

## 2020-10-21 ENCOUNTER — Other Ambulatory Visit: Payer: Self-pay

## 2020-10-21 ENCOUNTER — Emergency Department
Admission: EM | Admit: 2020-10-21 | Discharge: 2020-10-21 | Disposition: A | Discharge status: Home / Self Care | Payer: Medicare Other | Attending: Emergency Medicine | Admitting: Emergency Medicine

## 2020-10-21 DIAGNOSIS — T85528A Displacement of other gastrointestinal prosthetic devices, implants and grafts, initial encounter: Secondary | ICD-10-CM

## 2020-10-21 DIAGNOSIS — I5032 Chronic diastolic (congestive) heart failure: Secondary | ICD-10-CM | POA: Diagnosis not present

## 2020-10-21 DIAGNOSIS — Z7984 Long term (current) use of oral hypoglycemic drugs: Secondary | ICD-10-CM | POA: Diagnosis not present

## 2020-10-21 DIAGNOSIS — Z853 Personal history of malignant neoplasm of breast: Secondary | ICD-10-CM | POA: Diagnosis not present

## 2020-10-21 DIAGNOSIS — K9423 Gastrostomy malfunction: Secondary | ICD-10-CM | POA: Diagnosis not present

## 2020-10-21 DIAGNOSIS — E1140 Type 2 diabetes mellitus with diabetic neuropathy, unspecified: Secondary | ICD-10-CM | POA: Diagnosis not present

## 2020-10-21 LAB — CBG MONITORING, ED: Glucose-Capillary: 162 mg/dL — ABNORMAL HIGH (ref 70–99)

## 2020-10-21 MED ORDER — SODIUM CHLORIDE (PF) 0.9 % IJ SOLN
10.0000 mL | INTRAMUSCULAR | Status: DC | PRN
  Administered 2020-10-21: 10 mL

## 2020-10-21 MED ORDER — DIATRIZOATE MEGLUMINE & SODIUM 66-10 % PO SOLN
30.0000 mL | Freq: Once | ORAL | Status: AC
  Administered 2020-10-21: 30 mL

## 2020-10-21 NOTE — ED Notes (Signed)
Pt presents from home, feeding tube which was recently replaced (size 14) fell out per herself and her husband.

## 2020-10-21 NOTE — ED Triage Notes (Signed)
First Nurse Note:  Arrives via [facility] from home.  G-Tube out today.  Per report, feeding tube has come out recently.  Denies pain.  Has trach and foley.  VS wnl.  Oxygen at 10L/ mask over trach for comfort.

## 2020-10-21 NOTE — ED Notes (Signed)
ED provider at bedside to replace feeding tube.

## 2020-10-21 NOTE — ED Triage Notes (Signed)
See first nurse note- Pt to ER via [facility] after feeding tube came out. Pt reports usually having three feeds a day, states it came out after receiving lunch feed. Pt reports it is now continuously oozing. D/c from hospital Tuesday for a hernia repair.

## 2020-10-21 NOTE — ED Provider Notes (Signed)
[facility] Emergency Department Provider Note ____________________________________________   Event Date/Time   First MD Initiated Contact with Patient 10/21/20 1619     (approximate)  I have reviewed the triage vital signs and the nursing notes.  HISTORY  Chief Complaint Feeding Tube Complication   HPI Angela David is a 80 y.o. femalewho presents to the ED for evaluation of dislodged G-tube.  Chart review indicates recent right-sided lumpectomy and robotic ventral hernia repair 3 days ago. Looks like her G-tube was originally placed December 2021, and was recently replaced on 8/6 due to it being dislodged with a coughing fit at that time.  14 [place] G-tube.  Patient presents to the ED for evaluation of dislodged PEG tube.  She reports that she had just provided herself nutrition and medications through her tube when it suddenly dislodged.  She denies position changes, coughing, Valsalva or any other precipitating causes.  She reports that she feels fine otherwise and has no complaints.  Denies abdominal pain, emesis, fevers.  She reports that she has been doing well since her recent surgery.  Past Medical History:  Diagnosis Date   Breast cancer, right ([place]) 2002   Tx'd with chemotherapy and XRT   Chronic respiratory failure ([place])    s/p tracheostomy in 2012; on ventilatory support at hs   Diabetic peripheral neuropathy ([place]) 08/28/2016   GERD (gastroesophageal reflux disease)    Glaucoma    High cholesterol    History of colon polyps    Osteoarthritis    Personal history of chemotherapy 2002   BREAST CA   Personal history of radiation therapy 2002   BREAST CA   S/P percutaneous endoscopic gastrostomy (PEG) tube placement (HCC)    T2DM (type 2 diabetes mellitus) ([place])    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   Ventral hernia    Vocal cord paralysis 2012    Patient Active Problem List   Diagnosis Date Noted   Chronic respiratory failure  with hypoxia ([place]) 03/29/2020   Chronic diastolic CHF (congestive heart failure) ([place]) 01/31/2020   Uncontrolled type 2 diabetes mellitus with hyperglycemia ([place]) 01/31/2020   AF (paroxysmal atrial fibrillation) ([place]) 01/31/2020   Aspiration pneumonia of both lower lobes due to gastric secretions ([place]) 123XX123   Acute metabolic encephalopathy 123XX123   Acute respiratory failure ([place]) 01/23/2020   Left lower lobe pneumonia 01/16/2020   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   Drug-induced polyneuropathy ([place]) 05/05/2019   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   Weakness    Hypoxia    Acute respiratory failure with hypoxia and hypercapnia ([place]) 03/21/2019   Hypotension 03/21/2019   Acute encephalopathy 03/21/2019   Multiple fractures of ribs, right side, initial encounter for closed fracture 12/25/2018   Colon polyp 11/06/2018   Esophageal dysphagia    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    Stricture and stenosis of esophagus    Elevated CEA 09/02/2018   Goals of care, counseling/discussion 09/02/2018   Polyp of transverse colon 09/02/2018   Foreign body in right foot 05/26/2018   History of tracheostomy 04/30/2018   Moderate episode of recurrent major depressive disorder ([place]) 04/03/2018   Poor balance 04/03/2018   Shoulder lesion, right 02/28/2017   Cervical radiculopathy 09/28/2016   Peripheral neuropathy 08/28/2016   Diabetic peripheral neuropathy ([place]) 08/28/2016   Leg weakness, bilateral 08/13/2016   Gait abnormality 08/13/2016   Carcinoma of overlapping sites of right breast in female, estrogen receptor negative ([place]) 12/27/2015   Malignant neoplasm of breast ([place])  03/28/2015   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   Hypercholesterolemia 03/28/2015   Recurrent major depressive disorder, in partial remission ([place]) 03/01/2015   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   Cephalalgia 09/15/2014   Headache due to trauma  09/15/2014   Osteopenia 01/07/2014   Age-related osteoporosis without current pathological fracture 01/07/2014   History of colonic polyps 12/14/2013   History of breast cancer 11/26/2012   H/O malignant neoplasm of breast 11/26/2012   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   Tracheostomy present ([place]) 12/06/2011   Calcification of bronchus or trachea 01/16/2011   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   Anxiety state 12/19/2010   Blood in sputum 12/19/2010   HLD (hyperlipidemia) 12/19/2010   Type 2 diabetes mellitus ([place]) 12/19/2010    Past Surgical History:  Procedure Laterality Date   ABDOMINAL HYSTERECTOMY     BREAST BIOPSY Right 2008   neg   BREAST BIOPSY Right 06/30/2020   "Q" clip-path pending   BREAST EXCISIONAL BIOPSY Right 2002   positive   BREAST LUMPECTOMY Right 2002   chemo and radiation   BREAST LUMPECTOMY WITH RADIOFREQUENCY TAG IDENTIFICATION Right 10/18/2020   Procedure: BREAST LUMPECTOMY WITH RADIOFREQUENCY TAG IDENTIFICATION;  Surgeon: Jules Husbands, MD;  Location: [facility] ORS;  Service: General;  Laterality: Right;   BREAST SURGERY Right 2002   LUMPECTOMY, radiation, chemotherapy   CATARACT EXTRACTION, BILATERAL     CHOLECYSTECTOMY N/A 12/10/2017   Procedure: LAPAROSCOPIC CHOLECYSTECTOMY;  Surgeon: Benjamine Sprague, DO;  Location: [facility] ORS;  Service: General;  Laterality: N/A;   COLON RESECTION Right 11/06/2018   Procedure: HAND ASSISTED LAPAROSCOPIC COLON RESECTION, RIGHT;  Surgeon: Jules Husbands, MD;  Location: [facility] ORS;  Service: General;  Laterality: Right;   COLONOSCOPY  2009,12/30/2013   COLONOSCOPY WITH PROPOFOL N/A 04/16/2018   Tubulovillous polyp proximal transverse colon, large sessile polyp, proximal transverse colon;  Surgeon: Robert Bellow, MD;  Location: [facility] ENDOS[facility];  Service: Endoscopy;  Laterality: N/A;   ESOPHAGOGASTRODUODENOSCOPY (EGD) WITH PROPOFOL N/A 10/14/2018   Procedure: ESOPHAGOGASTRODUODENOSCOPY (EGD) WITH PROPOFOL;  Surgeon: Lucilla Lame, MD;   Location: [facility] END[facility];  Service: Endoscopy;  Laterality: N/A;   TRACHEAL SURGERY  2013   TRACHEOSTOMY N/A    Dr Kathyrn Sheriff   XI ROBOTIC ASSISTED VENTRAL HERNIA N/A 10/18/2020   Procedure: XI ROBOTIC ASSISTED VENTRAL HERNIA;  Surgeon: Jules Husbands, MD;  Location: [facility] ORS;  Service: General;  Laterality: N/A;    Prior to Admission medications   Medication Sig Start Date End Date Taking? Authorizing Provider  acetaminophen (TYLENOL) 500 MG tablet Take 1,000 mg by mouth in the morning and at bedtime.    [provider]  busPIRone (BUSPAR) 15 MG tablet Place 15 mg into feeding tube 2 (two) times daily. 09/26/20   [provider]  carboxymethylcellulose (REFRESH PLUS) 0.5 % SOLN Place 1 drop into both eyes 3 (three) times daily as needed (dry eyes).    [provider]  denosumab (PROLIA) 60 MG/ML SOSY injection Inject 60 mg into the skin every 6 (six) months.    [provider]  escitalopram (LEXAPRO) 10 MG tablet Place 3 tablets (30 mg total) into feeding tube daily. Patient taking differently: Place 10 mg into feeding tube daily. 02/13/20   Flora Lipps, MD  escitalopram (LEXAPRO) 20 MG tablet Place 20 mg into feeding tube daily.    [provider]  fluconazole (DIFLUCAN) 150 MG tablet Place 150 mg into feeding tube once a week. 10/07/20   [provider]  folic acid (FOLVITE) 1 MG tablet Place 1 tablet (1 mg total) into feeding tube daily. 02/13/20   Flora Lipps, MD  gabapentin (NEURONTIN) 600 MG tablet Place 600 mg into feeding tube 3 (three) times daily.    [provider]  HYDROcodone-acetaminophen (NORCO/VICODIN) 5-325 MG tablet Take 1-2 tablets by mouth every 6 (six) hours as needed for moderate pain. 10/18/20   Pabon, Diego F, MD  lovastatin (MEVACOR) 20 MG tablet Place 20 mg into feeding tube daily. 06/13/20   [provider]  melatonin 5 MG TABS Place 1 tablet (5 mg total) into feeding tube at bedtime. 02/13/20   Flora Lipps, MD  metFORMIN (GLUCOPHAGE) 1000 MG tablet Place 1,000 mg into feeding tube daily with breakfast. 06/10/20   [provider]  metoprolol tartrate (LOPRESSOR) 25 MG tablet Place 25 mg into feeding tube daily. 06/13/20   [provider]  Multiple Vitamins-Minerals (HAIR/SKIN/NAILS) TABS Take 1 tablet by mouth daily.    [provider]  pantoprazole (PROTONIX) 40 MG tablet Take 40 mg by mouth daily.    [provider]  rOPINIRole (REQUIP) 0.5 MG tablet Place 1 tablet (0.5 mg total) into feeding tube at bedtime. 02/13/20   Flora Lipps, MD    Allergies Shellfish allergy, Sodium sulfite, Sulfa antibiotics, and Codeine  Family History  Problem Relation Age of Onset   Cancer Father        bone cancer   Cancer Sister        lymphoma   Cancer Brother    Breast cancer Neg Hx     Social History Social History   Tobacco Use   Smoking status: Never   Smokeless tobacco: Never  Vaping Use   Vaping Use: Never used  Substance Use Topics   Alcohol use: No   Drug use: No    Review of Systems  Constitutional: No fever/chills Eyes: No visual changes. ENT: No sore throat. Cardiovascular: Denies chest pain. Respiratory: Denies shortness of breath. Gastrointestinal: No abdominal pain.  No nausea, no vomiting.  No diarrhea.  No constipation. Genitourinary: Negative for dysuria. Musculoskeletal: Negative for back pain. Skin: Negative for rash. Neurological: Negative for headaches, focal weakness or numbness.  ____________________________________________   PHYSICAL EXAM:  VITAL SIGNS: Vitals:   10/21/20 1612  BP: (!) 102/52  Pulse: 90  Resp: 16  Temp: 98.6 F (37 C)  SpO2: 97%    Constitutional: Alert and oriented. Well appearing and in no acute distress.  Conversational via trach. Eyes: Conjunctivae are normal. PERRL. EOMI. Head: Atraumatic. Nose: No congestion/rhinnorhea. Mouth/Throat: Mucous membranes are moist.  Oropharynx  non-erythematous. Neck: No stridor. No cervical spine tenderness to palpation.  Trach in place. Cardiovascular: Normal rate, regular rhythm. Grossly normal heart sounds.  Good peripheral circulation. Respiratory: Normal respiratory effort.  No retractions. Lungs CTAB. Gastrointestinal: Soft , nondistended, nontender to palpation. No CVA tenderness. 71 French PEG tube laying on top of her abdomen with balloon still inflated and totally dislodged.  Stomach contents are slowly oozing from her insertion site.  No surrounding erythema, induration or tenderness.. Well-healing surgical incisions without dehiscence, purulence or erythema. Musculoskeletal: No lower extremity tenderness nor edema.  No joint effusions. No signs of acute trauma. Neurologic:  Normal speech and language. No gross focal neurologic deficits are appreciated.  Skin:  Skin is warm, dry and intact. No rash noted. Psychiatric: Mood and affect are normal. Speech and behavior are normal.  ____________________________________________   LABS (all labs ordered are listed, but only abnormal  results are displayed)  Labs Reviewed  CBG MONITORING, ED - Abnormal; Notable for the following components:      Result Value   Glucose-Capillary 162 (*)    All other components within normal limits   ____________________________________________  12 Lead EKG   ____________________________________________  RADIOLOGY  ED MD interpretation: Plain film of the abdomen reviewed by me with contrast within the stomach, no signs of extravasation.  Official radiology report(s): DG Abdomen 1 View  Result Date: 10/21/2020 CLINICAL DATA:  Peg tube placement EXAM: ABDOMEN - 1 VIEW COMPARISON:  10/01/2020 FINDINGS: Single abdominal radiograph obtained following administration of contrast through the patient's PEG tube. Peg tube projects over the gastric body. Contrast opacifies the stomach. No gross extravasation. IMPRESSION: Gastrostomy tube within  body of stomach.  No gross extravasation. Electronically Signed   By: Donavan Foil M.D.   On: 10/21/2020 17:33    ____________________________________________   PROCEDURES and INTERVENTIONS  Procedure(s) performed (including Critical Care):  Procedures  Replacement of PEG tube, performed by me.  Abdomen was cleaned with Betadine and a lubricated 18 French PEG tube was easily inserted to pre-existing insertion site.  Well-tolerated.  Inflated with 20 cc of sterile saline as directed by the manufacturer.  Medications  sodium chloride (PF) 0.9 % injection 10 mL (10 mLs Intracatheter Given 10/21/20 1724)  diatrizoate meglumine-sodium (GASTROGRAFIN) 66-10 % solution 30 mL (30 mLs Per Tube Not Given 10/21/20 1723)    ____________________________________________   MDM / ED COURSE   Pleasant 80 year old woman who is PEG tube dependent resents to the ED after her 64 French PEG tube dislodged spontaneously despite an inflated balloon.  This was easily replaced with 18 French tube.   We will perform a plain film with contrast to ensure appropriate positioning by radiograph, though the replacement was easy and I anticipate no issues.   Clinical Course as of 10/21/20 1750  Fri Oct 21, 2020  1649 G-tube placed and well-tolerated.  [place]. [DS]  [id number] X-ray looks good.  We will discharge. [DS]    Clinical Course User Index [DS] Vladimir Crofts, MD    ____________________________________________   FINAL CLINICAL IMPRESSION(S) / ED DIAGNOSES  Final diagnoses:  Dislodged gastrostomy tube     ED Discharge Orders     None        Honesti Seaberg   Note:  This document was prepared using Dragon voice recognition software and may include unintentional dictation errors.    Vladimir Crofts, MD 10/21/20 [phone]

## 2020-10-21 NOTE — Discharge Instructions (Signed)
We replaced your 76 French tube with an 35 [place] tube.

## 2020-10-24 ENCOUNTER — Ambulatory Visit (INDEPENDENT_AMBULATORY_CARE_PROVIDER_SITE_OTHER): Payer: Medicare Other | Admitting: Urology

## 2020-10-24 ENCOUNTER — Encounter: Payer: Self-pay | Admitting: Internal Medicine

## 2020-10-24 ENCOUNTER — Telehealth: Payer: Self-pay

## 2020-10-24 ENCOUNTER — Encounter: Payer: Self-pay | Admitting: Urology

## 2020-10-24 ENCOUNTER — Other Ambulatory Visit: Payer: Self-pay

## 2020-10-24 DIAGNOSIS — R339 Retention of urine, unspecified: Secondary | ICD-10-CM | POA: Diagnosis not present

## 2020-10-24 NOTE — Telephone Encounter (Signed)
Spoke with patient, has been voiding with no difficulty. Denies any other symptoms. Aware if she develops pain, dysuria, frequency or is unable to void to notify the office. Reviewed symptoms to be aware of in detail. Patient mentioned she has been having diarrhea-denies any other symptoms. Advised to stay hydrated and inform PCP if symptoms worsen. Voiced understanding.

## 2020-10-24 NOTE — Progress Notes (Signed)
10/24/2020 10:13 AM   Angela David 1941-02-23 [id number]  Referring provider: Iva Boop, MD [address] [place],  [place] [address]  Chief Complaint  Patient presents with   Urinary Retention    HPI: Dr Erlene Quan: Retention following partial mastectomy and ventral hernia repair October 18, 2020.  2 days of constipation.  No past history of retention.  Last week residual was 226 mL and she could not void and was uncomfortable.  Today she had a trial of voiding.  Likely not infected.  Successful fill and pull voiding the same amount that was placed     PMH: Past Medical History:  Diagnosis Date   Breast cancer, right ([place]) 2002   Tx'd with chemotherapy and XRT   Chronic respiratory failure ([place])    s/p tracheostomy in 2012; on ventilatory support at hs   Diabetic peripheral neuropathy ([place]) 08/28/2016   GERD (gastroesophageal reflux disease)    Glaucoma    High cholesterol    History of colon polyps    Osteoarthritis    Personal history of chemotherapy 2002   BREAST CA   Personal history of radiation therapy 2002   BREAST CA   S/P percutaneous endoscopic gastrostomy (PEG) tube placement (HCC)    T2DM (type 2 diabetes mellitus) ([place])    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   Ventral hernia    Vocal cord paralysis 2012    Surgical History: Past Surgical History:  Procedure Laterality Date   ABDOMINAL HYSTERECTOMY     BREAST BIOPSY Right 2008   neg   BREAST BIOPSY Right 06/30/2020   "Q" clip-path pending   BREAST EXCISIONAL BIOPSY Right 2002   positive   BREAST LUMPECTOMY Right 2002   chemo and radiation   BREAST LUMPECTOMY WITH RADIOFREQUENCY TAG IDENTIFICATION Right 10/18/2020   Procedure: BREAST LUMPECTOMY WITH RADIOFREQUENCY TAG IDENTIFICATION;  Surgeon: Jules Husbands, MD;  Location: [facility];  Service: General;  Laterality: Right;   BREAST SURGERY Right 2002   LUMPECTOMY, radiation, chemotherapy   CATARACT EXTRACTION, BILATERAL      CHOLECYSTECTOMY N/A 12/10/2017   Procedure: LAPAROSCOPIC CHOLECYSTECTOMY;  Surgeon: Benjamine Sprague, DO;  Location: [facility];  Service: General;  Laterality: N/A;   COLON RESECTION Right 11/06/2018   Procedure: HAND ASSISTED LAPAROSCOPIC COLON RESECTION, RIGHT;  Surgeon: Jules Husbands, MD;  Location: [facility];  Service: General;  Laterality: Right;   COLONOSCOPY  2009,12/30/2013   COLONOSCOPY WITH PROPOFOL N/A 04/16/2018   Tubulovillous polyp proximal transverse colon, large sessile polyp, proximal transverse colon;  Surgeon: Robert Bellow, MD;  Location: [facility];  Service: Endoscopy;  Laterality: N/A;   ESOPHAGOGASTRODUODENOSCOPY (EGD) WITH PROPOFOL N/A 10/14/2018   Procedure: ESOPHAGOGASTRODUODENOSCOPY (EGD) WITH PROPOFOL;  Surgeon: Lucilla Lame, MD;  Location: [facility];  Service: Endoscopy;  Laterality: N/A;   TRACHEAL SURGERY  2013   TRACHEOSTOMY N/A    Dr Kathyrn Sheriff   XI ROBOTIC ASSISTED VENTRAL HERNIA N/A 10/18/2020   Procedure: XI ROBOTIC ASSISTED VENTRAL HERNIA;  Surgeon: Jules Husbands, MD;  Location: [facility];  Service: General;  Laterality: N/A;    Home Medications:  Allergies as of 10/24/2020       Reactions   Shellfish Allergy Shortness Of Breath   respitatory distress    Sodium Sulfite Shortness Of Breath   Severe Congestion that creates inability to breath   Sulfa Antibiotics Shortness Of Breath, Other (See Comments)   respitatory distress   Codeine Other (See Comments)   Per patient "it kept  her up all night"        Medication List        Accurate as of October 24, 2020 10:13 AM. If you have any questions, ask your nurse or doctor.          acetaminophen 500 MG tablet Commonly known as: TYLENOL Take 1,000 mg by mouth in the morning and at bedtime.   busPIRone 15 MG tablet Commonly known as: BUSPAR Place 15 mg into feeding tube 2 (two) times daily.   carboxymethylcellulose 0.5 % Soln Commonly known as: REFRESH PLUS Place 1 drop into both  eyes 3 (three) times daily as needed (dry eyes).   denosumab 60 MG/ML Sosy injection Commonly known as: PROLIA Inject 60 mg into the skin every 6 (six) months.   escitalopram 20 MG tablet Commonly known as: LEXAPRO Place 20 mg into feeding tube daily. What changed: Another medication with the same name was changed. Make sure you understand how and when to take each.   escitalopram 10 MG tablet Commonly known as: LEXAPRO Place 3 tablets (30 mg total) into feeding tube daily. What changed: how much to take   fluconazole 150 MG tablet Commonly known as: DIFLUCAN Place 150 mg into feeding tube once a week.   folic acid 1 MG tablet Commonly known as: FOLVITE Place 1 tablet (1 mg total) into feeding tube daily.   gabapentin 600 MG tablet Commonly known as: NEURONTIN Place 600 mg into feeding tube 3 (three) times daily.   Hair/Skin/Nails Tabs Take 1 tablet by mouth daily.   HYDROcodone-acetaminophen 5-325 MG tablet Commonly known as: NORCO/VICODIN Take 1-2 tablets by mouth every 6 (six) hours as needed for moderate pain.   lovastatin 20 MG tablet Commonly known as: MEVACOR Place 20 mg into feeding tube daily.   melatonin 5 MG Tabs Place 1 tablet (5 mg total) into feeding tube at bedtime.   metFORMIN 1000 MG tablet Commonly known as: GLUCOPHAGE Place 1,000 mg into feeding tube daily with breakfast.   metoprolol tartrate 25 MG tablet Commonly known as: LOPRESSOR Place 25 mg into feeding tube daily.   pantoprazole 40 MG tablet Commonly known as: PROTONIX Take 40 mg by mouth daily.   rOPINIRole 0.5 MG tablet Commonly known as: REQUIP Place 1 tablet (0.5 mg total) into feeding tube at bedtime.        Allergies:  Allergies  Allergen Reactions   Shellfish Allergy Shortness Of Breath    respitatory distress    Sodium Sulfite Shortness Of Breath    Severe Congestion that creates inability to breath   Sulfa Antibiotics Shortness Of Breath and Other (See Comments)     respitatory distress   Codeine Other (See Comments)    Per patient "it kept her up all night"    Family History: Family History  Problem Relation Age of Onset   Cancer Father        bone cancer   Cancer Sister        lymphoma   Cancer Brother    Breast cancer Neg Hx     Social History:  reports that she has never smoked. She has never used smokeless tobacco. She reports that she does not drink alcohol and does not use drugs.  ROS:                                        Physical Exam: There  were no vitals taken for this visit.  Constitutional:  Alert and oriented, No acute distress. HEENT: [place] AT, moist mucus membranes.  Trachea midline, no masses.   Laboratory Data: Lab Results  Component Value Date   WBC 10.4 06/23/2020   HGB 13.1 06/23/2020   HCT 40.6 06/23/2020   MCV 82.4 06/23/2020   PLT 236 06/23/2020    Lab Results  Component Value Date   CREATININE 0.95 06/23/2020    No results found for: PSA  No results found for: TESTOSTERONE  Lab Results  Component Value Date   HGBA1C 6.3 (H) 01/20/2020    Urinalysis    Component Value Date/Time   COLORURINE YELLOW (A) 01/31/2020 1742   APPEARANCEUR CLOUDY (A) 01/31/2020 1742   LABSPEC 1.007 01/31/2020 1742   PHURINE 5.0 01/31/2020 1742   GLUCOSEU >=500 (A) 01/31/2020 1742   HGBUR LARGE (A) 01/31/2020 1742   BILIRUBINUR NEGATIVE 01/31/2020 1742   KETONESUR NEGATIVE 01/31/2020 1742   PROTEINUR 30 (A) 01/31/2020 1742   UROBILINOGEN 2.0 (H) 11/18/2010 1535   NITRITE NEGATIVE 01/31/2020 1742   LEUKOCYTESUR LARGE (A) 01/31/2020 1742    Pertinent Imaging:   Assessment & Plan: Patient will let us know she had trouble this afternoon.  Otherwise we will see her as needed  There are no diagnoses linked to this encounter.  No follow-ups on file.  Reece Packer, MD  [facility] [address], [place], [place] [address] [phone]

## 2020-10-24 NOTE — Telephone Encounter (Signed)
Incoming call from pt who states she was told to call back with an update on her voiding. She states that she has been able to void 3 times within a short amount of time. She has a feeding tube and estimates she has taken in about 30oz of fluids.

## 2020-10-24 NOTE — Progress Notes (Signed)
Fill and Pull Catheter Removal  Patient is present today for a catheter removal.  Patient was cleaned and prepped in a sterile fashion 244m of sterile water/ saline was instilled into the bladder when the patient felt the urge to urinate. 198mof water was then drained from the balloon.  A 16FR foley cath was removed from the bladder no complications were noted .  Patient as then given some time to void on their own.  Patient can void  20013mn their own after some time.  Patient tolerated well.  Performed by: RebVerlene MayerMA  Follow up/ Additional notes: Aware to call the office with any difficulty voiding

## 2020-10-29 ENCOUNTER — Emergency Department
Admission: EM | Admit: 2020-10-29 | Discharge: 2020-10-29 | Disposition: A | Discharge status: Home / Self Care | Payer: Medicare Other | Attending: Emergency Medicine | Admitting: Emergency Medicine

## 2020-10-29 ENCOUNTER — Emergency Department: Payer: Medicare Other

## 2020-10-29 ENCOUNTER — Other Ambulatory Visit: Payer: Self-pay

## 2020-10-29 ENCOUNTER — Encounter: Payer: Self-pay | Admitting: Emergency Medicine

## 2020-10-29 DIAGNOSIS — I5032 Chronic diastolic (congestive) heart failure: Secondary | ICD-10-CM | POA: Diagnosis not present

## 2020-10-29 DIAGNOSIS — S0990XA Unspecified injury of head, initial encounter: Secondary | ICD-10-CM | POA: Insufficient documentation

## 2020-10-29 DIAGNOSIS — E785 Hyperlipidemia, unspecified: Secondary | ICD-10-CM | POA: Insufficient documentation

## 2020-10-29 DIAGNOSIS — I110 Hypertensive heart disease with heart failure: Secondary | ICD-10-CM | POA: Insufficient documentation

## 2020-10-29 DIAGNOSIS — N3001 Acute cystitis with hematuria: Secondary | ICD-10-CM | POA: Diagnosis not present

## 2020-10-29 DIAGNOSIS — E1169 Type 2 diabetes mellitus with other specified complication: Secondary | ICD-10-CM | POA: Insufficient documentation

## 2020-10-29 DIAGNOSIS — Z7984 Long term (current) use of oral hypoglycemic drugs: Secondary | ICD-10-CM | POA: Diagnosis not present

## 2020-10-29 DIAGNOSIS — W1839XA Other fall on same level, initial encounter: Secondary | ICD-10-CM | POA: Diagnosis not present

## 2020-10-29 DIAGNOSIS — M25551 Pain in right hip: Secondary | ICD-10-CM | POA: Diagnosis not present

## 2020-10-29 DIAGNOSIS — Z853 Personal history of malignant neoplasm of breast: Secondary | ICD-10-CM | POA: Diagnosis not present

## 2020-10-29 DIAGNOSIS — Z79899 Other long term (current) drug therapy: Secondary | ICD-10-CM | POA: Diagnosis not present

## 2020-10-29 LAB — BASIC METABOLIC PANEL
Anion gap: 13 (ref 5–15)
BUN: 20 mg/dL (ref 8–23)
CO2: 24 mmol/L (ref 22–32)
Calcium: 9.8 mg/dL (ref 8.9–10.3)
Chloride: 97 mmol/L — ABNORMAL LOW (ref 98–111)
Creatinine, Ser: 0.86 mg/dL (ref 0.44–1.00)
GFR, Estimated: >60 mL/min (ref >60)
Glucose, Bld: 109 mg/dL — ABNORMAL HIGH (ref 70–99)
Potassium: 4.4 mmol/L (ref 3.5–5.1)
Sodium: 134 mmol/L — ABNORMAL LOW (ref 135–145)

## 2020-10-29 LAB — CBC WITH DIFFERENTIAL/PLATELET
Abs Immature Granulocytes: 0.16 10*3/uL — ABNORMAL HIGH (ref 0.00–0.07)
Basophils Absolute: 0.1 10*3/uL (ref 0.0–0.1)
Basophils Relative: 0 %
Eosinophils Absolute: 0.2 10*3/uL (ref 0.0–0.5)
Eosinophils Relative: 1 %
HCT: 41.9 % (ref 36.0–46.0)
Hemoglobin: 14 g/dL (ref 12.0–15.0)
Immature Granulocytes: 1 %
Lymphocytes Relative: 13 %
Lymphs Abs: 2.1 10*3/uL (ref 0.7–4.0)
MCH: 27.8 pg (ref 26.0–34.0)
MCHC: 33.4 g/dL (ref 30.0–36.0)
MCV: 83.3 fL (ref 80.0–100.0)
Monocytes Absolute: 0.7 10*3/uL (ref 0.1–1.0)
Monocytes Relative: 4 %
Neutro Abs: 12.8 10*3/uL — ABNORMAL HIGH (ref 1.7–7.7)
Neutrophils Relative %: 81 %
Platelets: 251 10*3/uL (ref 150–400)
RBC: 5.03 MIL/uL (ref 3.87–5.11)
RDW: 13.5 % (ref 11.5–15.5)
WBC: 16 10*3/uL — ABNORMAL HIGH (ref 4.0–10.5)
nRBC: 0 % (ref 0.0–0.2)

## 2020-10-29 LAB — URINALYSIS, ROUTINE W REFLEX MICROSCOPIC
Bilirubin Urine: NEGATIVE
Glucose, UA: NEGATIVE mg/dL
Ketones, ur: NEGATIVE mg/dL
Nitrite: NEGATIVE
Protein, ur: NEGATIVE mg/dL
Specific Gravity, Urine: 1.015 (ref 1.005–1.030)
WBC, UA: >50 WBC/hpf — ABNORMAL HIGH (ref 0–5)
pH: 7 (ref 5.0–8.0)

## 2020-10-29 MED ORDER — CIPROFLOXACIN HCL 500 MG PO TABS: 500.0000 mg | ORAL_TABLET | Freq: Two times a day (BID) | ORAL | 0 refills | Status: AC

## 2020-10-29 MED ORDER — CIPROFLOXACIN HCL 500 MG PO TABS
500.0000 mg | ORAL_TABLET | Freq: Once | ORAL | Status: AC
  Administered 2020-10-29: 500 mg via ORAL
  Filled 2020-10-29: qty 1

## 2020-10-29 NOTE — ED Provider Notes (Signed)
[facility] Emergency Department Provider Note ____________________________________________   Event Date/Time   First MD Initiated Contact with Patient 10/29/20 1558     (approximate)  I have reviewed the triage vital signs and the nursing notes.   HISTORY  Chief Complaint Fall  HPI Angela David is a 80 y.o. female with history of diabetes, chronic respiratory failure with hypoxia, tracheostomy dependent presents to the emergency department for treatment and evaluation after sustaining a mechanical, nonsyncopal fall.  She she had gone to the bathroom and was pulling her pants up and just lost her balance. She hit the back of her head and her right hip hurts. No alleviating measures prior to arrival. She is not anticoagulated.      Past Medical History:  Diagnosis Date   Breast cancer, right ([place]) 2002   Tx'd with chemotherapy and XRT   Chronic respiratory failure ([place])    s/p tracheostomy in 2012; on ventilatory support at hs   Diabetic peripheral neuropathy ([place]) 08/28/2016   GERD (gastroesophageal reflux disease)    Glaucoma    High cholesterol    History of colon polyps    Osteoarthritis    Personal history of chemotherapy 2002   BREAST CA   Personal history of radiation therapy 2002   BREAST CA   S/P percutaneous endoscopic gastrostomy (PEG) tube placement (HCC)    T2DM (type 2 diabetes mellitus) ([place])    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   Ventral hernia    Vocal cord paralysis 2012    Patient Active Problem List   Diagnosis Date Noted   Chronic respiratory failure with hypoxia ([place]) 03/29/2020   Chronic diastolic CHF (congestive heart failure) ([place]) 01/31/2020   Uncontrolled type 2 diabetes mellitus with hyperglycemia ([place]) 01/31/2020   AF (paroxysmal atrial fibrillation) ([place]) 01/31/2020   Aspiration pneumonia of both lower lobes due to gastric secretions ([place]) 123XX123   Acute metabolic encephalopathy 123XX123   Acute  respiratory failure ([place]) 01/23/2020   Left lower lobe pneumonia 01/16/2020   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/05/2019   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   Weakness    Hypoxia    Acute respiratory failure with hypoxia and hypercapnia ([place]) 03/21/2019   Hypotension 03/21/2019   Acute encephalopathy 03/21/2019   Multiple fractures of ribs, right side, initial encounter for closed fracture 12/25/2018   Colon polyp 11/06/2018   Esophageal dysphagia    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    Stricture and stenosis of esophagus    Elevated CEA 09/02/2018   Goals of care, counseling/discussion 09/02/2018   Polyp of transverse colon 09/02/2018   Foreign body in right foot 05/26/2018   History of tracheostomy 04/30/2018   Moderate episode of recurrent major depressive disorder ([place]) 04/03/2018   Poor balance 04/03/2018   Shoulder lesion, right 02/28/2017   Cervical radiculopathy 09/28/2016   Peripheral neuropathy 08/28/2016   Diabetic peripheral neuropathy ([place]) 08/28/2016   Leg weakness, bilateral 08/13/2016   Gait abnormality 08/13/2016   Carcinoma of overlapping sites of right breast in female, estrogen receptor negative ([place]) 12/27/2015   Malignant neoplasm of breast ([place]) 03/28/2015   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   Hypercholesterolemia 03/28/2015   Recurrent major depressive disorder, in partial remission ([place]) 03/01/2015   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   Cephalalgia 09/15/2014   Headache due to trauma 09/15/2014   Osteopenia 01/07/2014   Age-related osteoporosis without current pathological fracture 01/07/2014   History of  colonic polyps 12/14/2013   History of breast cancer 11/26/2012   H/O malignant neoplasm of breast 11/26/2012   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   Tracheostomy present ([place]) 12/06/2011   Calcification of bronchus or trachea 01/16/2011   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   Anxiety state 12/19/2010   Blood in sputum 12/19/2010   HLD (hyperlipidemia) 12/19/2010   Type 2 diabetes mellitus ([place]) 12/19/2010    Past Surgical History:  Procedure Laterality Date   ABDOMINAL HYSTERECTOMY     BREAST BIOPSY Right 2008   neg   BREAST BIOPSY Right 06/30/2020   "Q" clip-path pending   BREAST EXCISIONAL BIOPSY Right 2002   positive   BREAST LUMPECTOMY Right 2002   chemo and radiation   BREAST LUMPECTOMY WITH RADIOFREQUENCY TAG IDENTIFICATION Right 10/18/2020   Procedure: BREAST LUMPECTOMY WITH RADIOFREQUENCY TAG IDENTIFICATION;  Surgeon: Jules Husbands, MD;  Location: [facility] ORS;  Service: General;  Laterality: Right;   BREAST SURGERY Right 2002   LUMPECTOMY, radiation, chemotherapy   CATARACT EXTRACTION, BILATERAL     CHOLECYSTECTOMY N/A 12/10/2017   Procedure: LAPAROSCOPIC CHOLECYSTECTOMY;  Surgeon: Benjamine Sprague, DO;  Location: [facility] ORS;  Service: General;  Laterality: N/A;   COLON RESECTION Right 11/06/2018   Procedure: HAND ASSISTED LAPAROSCOPIC COLON RESECTION, RIGHT;  Surgeon: Jules Husbands, MD;  Location: [facility] ORS;  Service: General;  Laterality: Right;   COLONOSCOPY  2009,12/30/2013   COLONOSCOPY WITH PROPOFOL N/A 04/16/2018   Tubulovillous polyp proximal transverse colon, large sessile polyp, proximal transverse colon;  Surgeon: Robert Bellow, MD;  Location: [facility] END[facility]COPY;  Service: Endoscopy;  Laterality: N/A;   ESOPHAGOGASTRODUODENOSCOPY (EGD) WITH PROPOFOL N/A 10/14/2018   Procedure: ESOPHAGOGASTRODUODENOSCOPY (EGD) WITH PROPOFOL;  Surgeon: Lucilla Lame, MD;  Location: [facility] END[facility];  Service: Endoscopy;  Laterality: N/A;   TRACHEAL SURGERY  2013   TRACHEOSTOMY N/A    Dr Kathyrn Sheriff   XI ROBOTIC ASSISTED VENTRAL HERNIA N/A 10/18/2020   Procedure: XI ROBOTIC ASSISTED VENTRAL HERNIA;  Surgeon: Jules Husbands, MD;  Location: [facility] ORS;  Service: General;  Laterality: N/A;    Prior to Admission medications   Medication  Sig Start Date End Date Taking? Authorizing Provider  ciprofloxacin (CIPRO) 500 MG tablet Take 1 tablet (500 mg total) by mouth 2 (two) times daily for 7 days. 10/29/20 11/05/20 Yes Derhonda Eastlick B, FNP  acetaminophen (TYLENOL) 500 MG tablet Take 1,000 mg by mouth in the morning and at bedtime.    [provider]  busPIRone (BUSPAR) 15 MG tablet Place 15 mg into feeding tube 2 (two) times daily. 09/26/20   [provider]  carboxymethylcellulose (REFRESH PLUS) 0.5 % SOLN Place 1 drop into both eyes 3 (three) times daily as needed (dry eyes).    [provider]  denosumab (PROLIA) 60 MG/ML SOSY injection Inject 60 mg into the skin every 6 (six) months.    [provider]  escitalopram (LEXAPRO) 10 MG tablet Place 3 tablets (30 mg total) into feeding tube daily. Patient taking differently: Place 10 mg into feeding tube daily. 02/13/20   Flora Lipps, MD  escitalopram (LEXAPRO) 20 MG tablet Place 20 mg into feeding tube daily.    [provider]  fluconazole (DIFLUCAN) 150 MG tablet Place 150 mg into feeding tube once a week. 10/07/20   [provider]  folic acid (FOLVITE) 1 MG tablet Place 1 tablet (1 mg total) into feeding tube daily. 02/13/20   Flora Lipps, MD  gabapentin (NEURONTIN) 600 MG tablet Place 600 mg  into feeding tube 3 (three) times daily.    [provider]  HYDROcodone-acetaminophen (NORCO/VICODIN) 5-325 MG tablet Take 1-2 tablets by mouth every 6 (six) hours as needed for moderate pain. 10/18/20   Pabon, Diego F, MD  lovastatin (MEVACOR) 20 MG tablet Place 20 mg into feeding tube daily. 06/13/20   [provider]  melatonin 5 MG TABS Place 1 tablet (5 mg total) into feeding tube at bedtime. 02/13/20   Flora Lipps, MD  metFORMIN (GLUCOPHAGE) 1000 MG tablet Place 1,000 mg into feeding tube daily with breakfast. 06/10/20   [provider]  metoprolol tartrate (LOPRESSOR) 25 MG tablet Place 25 mg into feeding tube  daily. 06/13/20   [provider]  Multiple Vitamins-Minerals (HAIR/SKIN/NAILS) TABS Take 1 tablet by mouth daily.    [provider]  pantoprazole (PROTONIX) 40 MG tablet Take 40 mg by mouth daily.    [provider]  rOPINIRole (REQUIP) 0.5 MG tablet Place 1 tablet (0.5 mg total) into feeding tube at bedtime. 02/13/20   Flora Lipps, MD    Allergies Shellfish allergy, Sodium sulfite, Sulfa antibiotics, and Codeine  Family History  Problem Relation Age of Onset   Cancer Father        bone cancer   Cancer Sister        lymphoma   Cancer Brother    Breast cancer Neg Hx     Social History Social History   Tobacco Use   Smoking status: Never   Smokeless tobacco: Never  Vaping Use   Vaping Use: Never used  Substance Use Topics   Alcohol use: No   Drug use: No    Review of Systems  Constitutional: No fever/chills Eyes: No visual changes. ENT: No sore throat. Cardiovascular: Denies chest pain. Respiratory: Denies shortness of breath. Gastrointestinal: No abdominal pain.  No nausea, no vomiting.  No diarrhea.  No constipation. Genitourinary: Negative for dysuria. Musculoskeletal: Negative for back pain. Skin: Negative for rash. Neurological: Negative for headaches, focal weakness or numbness. ____________________________________________   PHYSICAL EXAM:  VITAL SIGNS: ED Triage Vitals  Enc Vitals Group     BP 10/29/20 1438 (!) 121/56     Pulse Rate 10/29/20 1438 77     Resp 10/29/20 1438 18     Temp 10/29/20 1438 98.5 F (36.9 C)     Temp Source 10/29/20 1438 Oral     SpO2 10/29/20 1438 98 %     Weight 10/29/20 1439 158 lb (71.7 kg)     Height 10/29/20 1439 '5\' 4"'$  (1.626 m)     Head Circumference --      Peak Flow --      Pain Score 10/29/20 1439 10     Pain Loc --      Pain Edu? --      Excl. in [place]? --     Constitutional: Alert and oriented.  Head: Hematoma left parietal scalp. Nose: No congestion/rhinnorhea. No  epistaxis. Mouth/Throat: Mucous membranes are moist.  Oropharynx non-erythematous. Neck: No stridor.   Hematological/Lymphatic/Immunilogical: No cervical lymphadenopathy. Cardiovascular: Normal rate, regular rhythm. Grossly normal heart sounds.  Good peripheral circulation. Respiratory: Normal respiratory effort.  No retractions. Lungs CTAB. Gastrointestinal: Soft and nontender. No distention. No abdominal bruits. No CVA tenderness. Genitourinary:  Musculoskeletal: No rotation or shortening of the right lower extremity. Able to flex at the knee without increase in hip pain. Mild pain with external rotation. No pain with internal rotation.  Neurologic:  Normal speech and language. No gross  focal neurologic deficits are appreciated. No gait instability. Skin:  Skin is warm, dry and intact. No rash noted. Psychiatric: Mood and affect are normal. Speech and behavior are normal.  ____________________________________________   LABS (all labs ordered are listed, but only abnormal results are displayed)  Labs Reviewed  CBC WITH DIFFERENTIAL/PLATELET - Abnormal; Notable for the following components:      Result Value   WBC 16.0 (*)    Neutro Abs 12.8 (*)    Abs Immature Granulocytes 0.16 (*)    All other components within normal limits  BASIC METABOLIC PANEL - Abnormal; Notable for the following components:   Sodium 134 (*)    Chloride 97 (*)    Glucose, Bld 109 (*)    All other components within normal limits  URINALYSIS, ROUTINE W REFLEX MICROSCOPIC - Abnormal; Notable for the following components:   Hgb urine dipstick SMALL (*)    Leukocytes,Ua SMALL (*)    WBC, UA >50 (*)    Bacteria, UA RARE (*)    All other components within normal limits  URINE CULTURE   ____________________________________________  EKG  ED ECG REPORT I, Delayne Sanzo, FNP-BC personally viewed and interpreted this ECG.   Date: 10/29/2020  EKG Time: 1442  Rate: 78  Rhythm: normal EKG, normal sinus  rhythm  Axis: normal  Intervals:none  ST&T Change: no ST elevation  ____________________________________________  RADIOLOGY  ED MD interpretation:    Head CT cervical spine CT negative for acute concerns per radiology.  Image of the right hip shows no acute bony abnormality.  I, Sherrie George, personally viewed and evaluated these images (plain radiographs) as part of my medical decision making, as well as reviewing the written report by the radiologist.  Official radiology report(s): CT HEAD WO CONTRAST (5MM)  Result Date: 10/29/2020 CLINICAL DATA:  Minor head trauma.  Normal mental status.  Headache. EXAM: CT HEAD WITHOUT CONTRAST CT CERVICAL SPINE WITHOUT CONTRAST TECHNIQUE: Multidetector CT imaging of the head and cervical spine was performed following the standard protocol without intravenous contrast. Multiplanar CT image reconstructions of the cervical spine were also generated. COMPARISON:  CT head 01/23/2020.  MRI cervical spine 06/11/2020. FINDINGS: CT HEAD FINDINGS Brain: There is no evidence of acute intracranial hemorrhage, mass lesion, brain edema or extra-axial fluid collection. The ventricles and subarachnoid spaces are appropriately sized for age. Fairly extensive chronic periventricular white matter disease is again noted, grossly stable and likely due to chronic small vessel ischemic changes. No evidence of acute cortical based infarct. Vascular: Intracranial vascular calcifications. No hyperdense vessel identified. Skull: Negative for fracture or focal lesion. Sinuses/Orbits: The visualized paranasal sinuses and mastoid air cells are clear. No orbital abnormalities are seen. Other: None. CT CERVICAL SPINE FINDINGS Alignment: Straightening.  No focal angulation or listhesis. Skull base and vertebrae: No evidence of acute fracture or traumatic subluxation. Soft tissues and spinal canal: No prevertebral fluid or swelling. No visible canal hematoma. Disc levels: Stable mild  spondylosis with loss of disc height, posterior osteophytes and uncinate spurring greatest at C5-6 and C6-7. Mild associated biforaminal narrowing. Upper chest: Tracheostomy in place. Chronic dilatation of the thoracic esophagus, similar to previous chest CT 01/17/2020. Emphysematous changes and scattered nodularity at both lung apices appears stable. There are probable radiation changes anteriorly in the right upper lobe. Other: Stable sclerosis of the right clavicular head and old right-sided rib fractures. Bilateral carotid atherosclerosis. IMPRESSION: 1. No acute intracranial or calvarial findings. 2. Stable extensive chronic small vessel ischemic changes in the  periventricular white matter. 3. No evidence of acute cervical spine fracture, traumatic subluxation or static signs of instability. 4. Stable mild cervical spondylosis and incidental findings in the upper chest. Electronically Signed   By: Richardean Sale M.D.   On: 10/29/2020 15:25   CT Cervical Spine Wo Contrast  Result Date: 10/29/2020 CLINICAL DATA:  Minor head trauma.  Normal mental status.  Headache. EXAM: CT HEAD WITHOUT CONTRAST CT CERVICAL SPINE WITHOUT CONTRAST TECHNIQUE: Multidetector CT imaging of the head and cervical spine was performed following the standard protocol without intravenous contrast. Multiplanar CT image reconstructions of the cervical spine were also generated. COMPARISON:  CT head 01/23/2020.  MRI cervical spine 06/11/2020. FINDINGS: CT HEAD FINDINGS Brain: There is no evidence of acute intracranial hemorrhage, mass lesion, brain edema or extra-axial fluid collection. The ventricles and subarachnoid spaces are appropriately sized for age. Fairly extensive chronic periventricular white matter disease is again noted, grossly stable and likely due to chronic small vessel ischemic changes. No evidence of acute cortical based infarct. Vascular: Intracranial vascular calcifications. No hyperdense vessel identified. Skull:  Negative for fracture or focal lesion. Sinuses/Orbits: The visualized paranasal sinuses and mastoid air cells are clear. No orbital abnormalities are seen. Other: None. CT CERVICAL SPINE FINDINGS Alignment: Straightening.  No focal angulation or listhesis. Skull base and vertebrae: No evidence of acute fracture or traumatic subluxation. Soft tissues and spinal canal: No prevertebral fluid or swelling. No visible canal hematoma. Disc levels: Stable mild spondylosis with loss of disc height, posterior osteophytes and uncinate spurring greatest at C5-6 and C6-7. Mild associated biforaminal narrowing. Upper chest: Tracheostomy in place. Chronic dilatation of the thoracic esophagus, similar to previous chest CT 01/17/2020. Emphysematous changes and scattered nodularity at both lung apices appears stable. There are probable radiation changes anteriorly in the right upper lobe. Other: Stable sclerosis of the right clavicular head and old right-sided rib fractures. Bilateral carotid atherosclerosis. IMPRESSION: 1. No acute intracranial or calvarial findings. 2. Stable extensive chronic small vessel ischemic changes in the periventricular white matter. 3. No evidence of acute cervical spine fracture, traumatic subluxation or static signs of instability. 4. Stable mild cervical spondylosis and incidental findings in the upper chest. Electronically Signed   By: Richardean Sale M.D.   On: 10/29/2020 15:25   DG Hip Unilat  With Pelvis 2-3 Views Right  Result Date: 10/29/2020 CLINICAL DATA:  Hip pain after a fall. EXAM: DG HIP (WITH OR WITHOUT PELVIS) 2-3V RIGHT COMPARISON:  None. FINDINGS: There is no evidence of hip fracture or dislocation. Mild degenerative changes are seen in both hips. IMPRESSION: No acute osseous injury. Electronically Signed   By: Zerita Boers M.D.   On: 10/29/2020 15:28    ____________________________________________   PROCEDURES  Procedure(s) performed (including Critical  Care):  Procedures  ____________________________________________   INITIAL IMPRESSION / ASSESSMENT AND PLAN     80 year old female presenting to the emergency department after mechanical, nonsyncopal fall at home.  See HPI for further details.  Plan will be to review labs and imaging obtained while awaiting ER room assignment.  Exam is overall reassuring.  DIFFERENTIAL DIAGNOSIS  Intracranial pathology, hip fracture, minor head injury, musculoskeletal strain.  ED COURSE  Leukocytosis of 16 with neutrophils 12.8 noted on CBC.  BMP is unremarkable.  Patient denies recent illness.  Plan will be to get a urinalysis.  Urinalysis indicates acute cystitis.  Patient states that she did have some dysuria this morning.  She also states that ciprofloxacin works best for her and  combined well with her other daily medications.  Will prescribe the ciprofloxacin but also add a urine culture.  She was encouraged to follow-up with her primary care provider in 10 to 14 days for repeat urinalysis.  She was encouraged to return to the emergency department for symptoms of concern if unable to schedule appointment.    ___________________________________________   FINAL CLINICAL IMPRESSION(S) / ED DIAGNOSES  Final diagnoses:  Minor head injury, initial encounter  Hip pain, acute, right  Acute cystitis with hematuria     ED Discharge Orders          Ordered    ciprofloxacin (CIPRO) 500 MG tablet  2 times daily        10/29/20 Angela David was evaluated in Emergency Department on 10/29/2020 for the symptoms described in the history of present illness. She was evaluated in the context of the global COVID-19 pandemic, which necessitated consideration that the patient might be at risk for infection with the SARS-CoV-2 virus that causes COVID-19. Institutional protocols and algorithms that pertain to the evaluation of patients at risk for COVID-19 are in a state of rapid  change based on information released by regulatory bodies including the CDC and federal and state organizations. These policies and algorithms were followed during the patient's care in the ED.   Note:  This document was prepared using Dragon voice recognition software and may include unintentional dictation errors.    Victorino Dike, FNP 10/29/20 1754    Nance Pear, MD 10/29/20 2018

## 2020-10-29 NOTE — Discharge Instructions (Addendum)
Please follow up with primary care.  Return to the ER for symptoms that change or worsen if unable to schedule an appointment.

## 2020-10-29 NOTE — ED Triage Notes (Signed)
Pt from home via [facility]. She fell with No LOC or taking blood thinners. She is complaining of head pain and tight hip pain ( no rotation or shortening per EMS). No sticks or BP on right side.   98.2 161 CBG 73 HR 99% RA 129/77  Started taking antibiotics today for a UTI.

## 2020-10-29 NOTE — ED Triage Notes (Signed)
Pt reports that she was getting off the commode and was pulling up her pants and lost her balance. She hit her head, and her right hip is hurting.

## 2020-10-31 LAB — URINE CULTURE

## 2020-11-02 ENCOUNTER — Encounter: Payer: Self-pay | Admitting: Surgery

## 2020-11-02 ENCOUNTER — Ambulatory Visit (INDEPENDENT_AMBULATORY_CARE_PROVIDER_SITE_OTHER): Payer: Medicare Other | Admitting: Surgery

## 2020-11-02 ENCOUNTER — Other Ambulatory Visit: Payer: Self-pay

## 2020-11-02 VITALS — BP 109/68 | HR 80 | Temp 98.9°F | Ht 64.0 in | Wt 158.0 lb

## 2020-11-02 DIAGNOSIS — Z09 Encounter for follow-up examination after completed treatment for conditions other than malignant neoplasm: Secondary | ICD-10-CM

## 2020-11-02 NOTE — Patient Instructions (Addendum)
Follow-up with our office in 3 months.   Please call and ask to speak with a nurse if you develop questions or concerns.   GENERAL POST-OPERATIVE PATIENT INSTRUCTIONS   WOUND CARE INSTRUCTIONS: If the wound becomes bright red and painful or starts to drain infected material that is not clear, please contact your physician immediately.  If the wound is mildly pink and has a thick firm ridge underneath it, this is normal, and is referred to as a healing ridge.  This will resolve over the next 4-6 weeks.  BATHING: You may shower if you have been informed of this by your surgeon. However, Please do not submerge in a tub, hot tub, or pool until incisions are completely sealed or have been told by your surgeon that you may do so.  DIET:  You may eat any foods that you can tolerate.  It is a good idea to eat a high fiber diet and take in plenty of fluids to prevent constipation.  If you do become constipated you may want to take a mild laxative or take ducolax tablets on a daily basis until your bowel habits are regular.  Constipation can be very uncomfortable, along with straining, after recent surgery.  ACTIVITY: You may want to hug a pillow when coughing and sneezing to add additional support to the surgical area, if you had abdominal or chest surgery, which will decrease pain during these times.  You are encouraged to walk and engage in light activity for the next two weeks.  You should not lift more than 20 pounds for 6 weeks after surgery as it could put you at increased risk for complications.  Twenty pounds is roughly equivalent to a plastic bag of groceries. At that time- Listen to your body when lifting, if you have pain when lifting, stop and then try again in a few days. Soreness after doing exercises or activities of daily living is normal as you get back in to your normal routine.  MEDICATIONS:  Try to take narcotic medications and anti-inflammatory medications, such as tylenol, ibuprofen,  naprosyn, etc., with food.  This will minimize stomach upset from the medication.  Should you develop nausea and vomiting from the pain medication, or develop a rash, please discontinue the medication and contact your physician.  You should not drive, make important decisions, or operate machinery when taking narcotic pain medication.  SUNBLOCK Use sun block to incision area over the next year if this area will be exposed to sun. This helps decrease scarring and will allow you avoid a permanent darkened area over your incision.  QUESTIONS:  Please feel free to call our office if you have any questions, and we will be glad to assist you.

## 2020-11-04 NOTE — Progress Notes (Signed)
Angela David is following after right lumpectomy as well as a robotic incisional hernia repair.  Pathology discussed with the patient in detail.  She is still using the G-tube.  Denies any fevers and chills.  She had a recent fall.  Denies any issues with her ventral or her breast incisions.  PE: NAD, trach in place. Right breast incision healing well without evidence of infection.  Abd: Robotic incisions healing well.  No infection no ventral hernia recurrence.  No peritonitis.  G-tube in place.  A/P doing well after right breast lumpectomy and ventral hernia repair.  We will follow her up in about 6 months or so for routine breast exam.

## 2020-11-18 ENCOUNTER — Encounter: Payer: Self-pay | Admitting: Internal Medicine

## 2020-11-20 IMAGING — RF DG ESOPHAGUS
13 of 21 series · 14 of 24 positions shown · non-contrast
Comparison: CT chest 12/17/2011

CLINICAL DATA: Dysphagia

EXAM:
ESOPHOGRAM / BARIUM SWALLOW / BARIUM TABLET STUDY
TECHNIQUE: Single contrast examination performed using thick barium liquid and
thin barium liquid. The patient was observed with fluoroscopy
swallowing a 13 mm barium sulphate tablet.
FLUOROSCOPY TIME:  Fluoroscopy Time:
Radiation Exposure Index (if provided by the fluoroscopic device):
16.2 mGy
Number of Acquired Spot Images: 16 full exposures

[Series 1: fluoro_barium 2fps_bw · 0.19mm/px · 1 of 1 slices shown (1 of 8)]
[im 1/1]
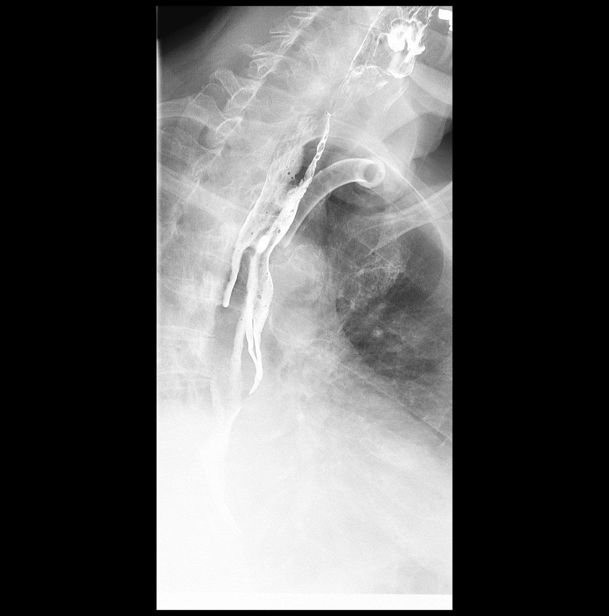

[Series 3: fluoro_barium 2fps_bw · 0.19mm/px · 1 of 1 slices shown (2 of 8)]
[im 1/1]
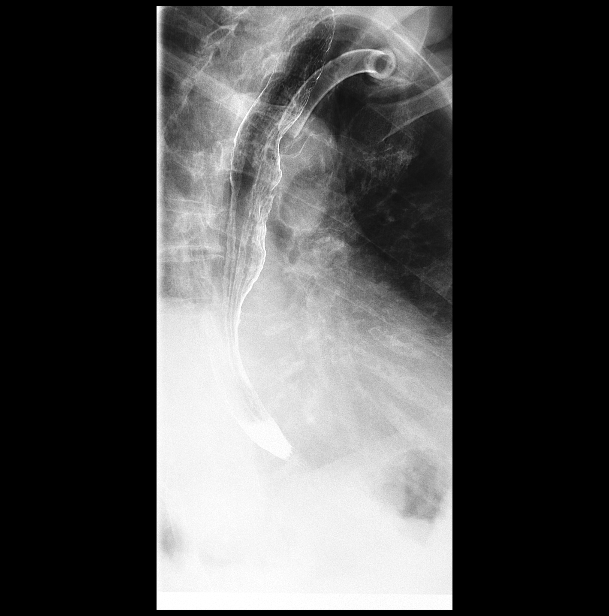

[Series 6: fluoro_barium 2fps_bw · 0.19mm/px · 1 of 1 slices shown (3 of 8)]
[im 1/1]
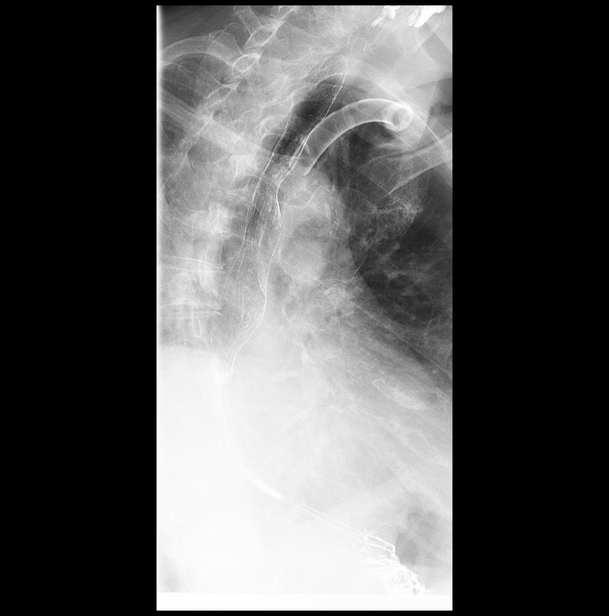

[Series 8: cp_standard · 0.28mm/px · 1 of 1 slices shown (1 of 5)]
[im 1/1]
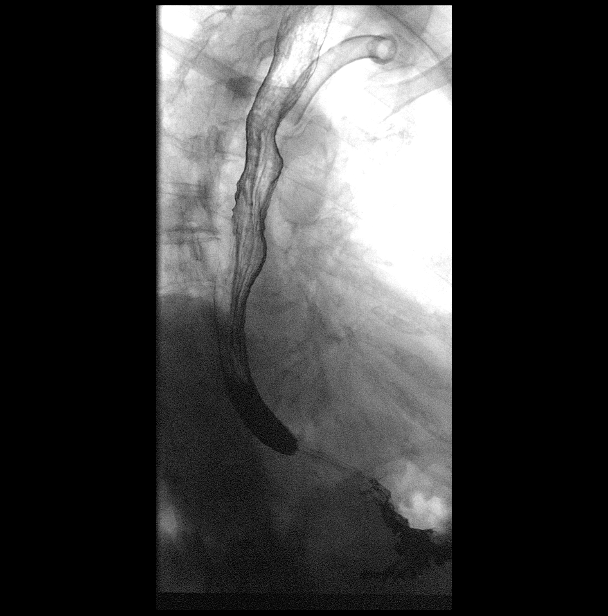

[Series 9: cp_standard · 0.28mm/px · 1 of 1 slices shown (2 of 5)]
[im 1/1]
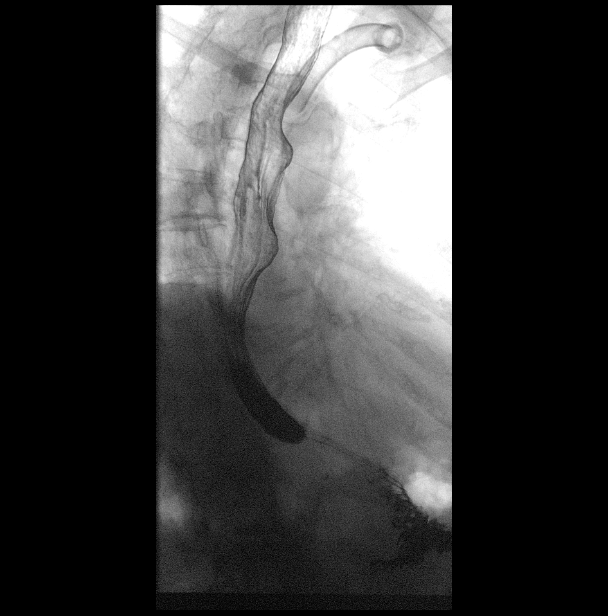

[Series 11: fluoro_barium 2fps_bw · 0.19mm/px · 1 of 1 slices shown (4 of 8)]
[im 1/1]
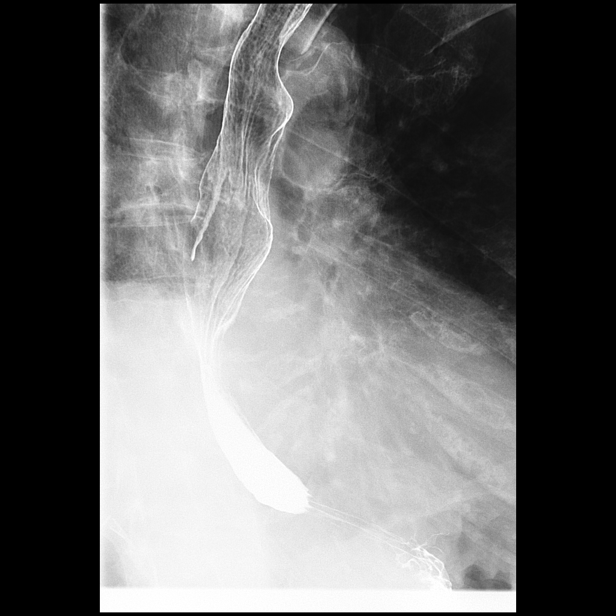

[Series 13: fluoro_barium 2fps_bw · 0.19mm/px · 1 of 1 slices shown (5 of 8)]
[im 1/1]
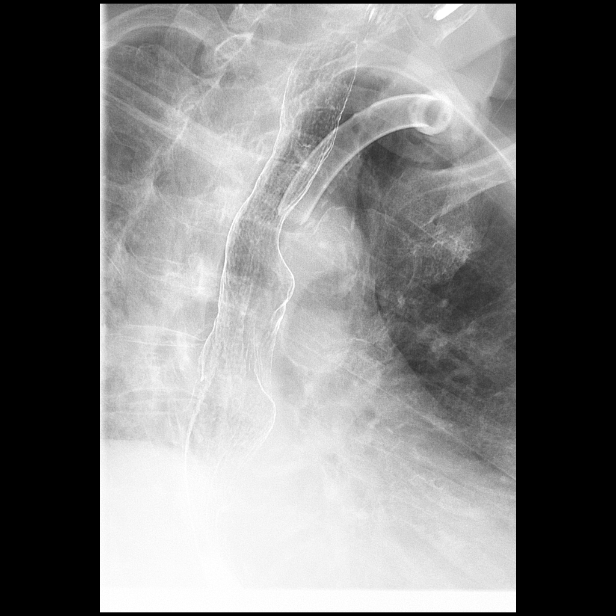

[Series 15: cp_standard · 0.19mm/px · 2 of 8 frames shown (3 of 5)]
[frame 1/8]
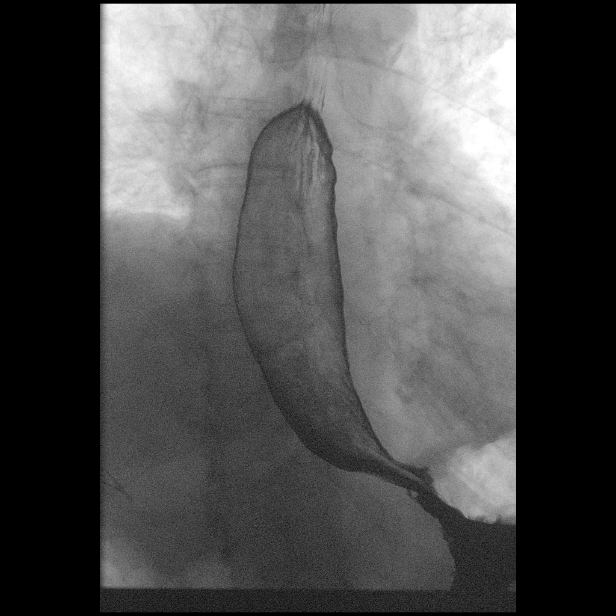
[frame 5/8]
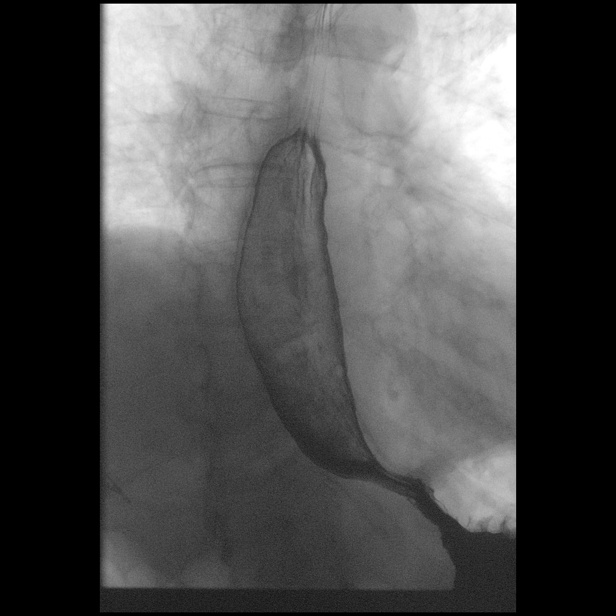

[Series 16: fluoro_barium 2fps_bw · 0.19mm/px · 1 of 1 slices shown (6 of 8)]
[im 1/1]
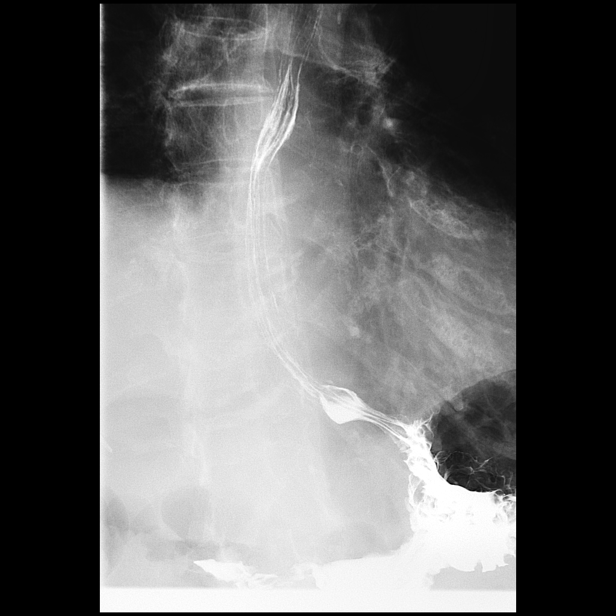

[Series 18: cp_standard · 0.27mm/px · 1 of 1 slices shown (4 of 5)]
[im 1/1]
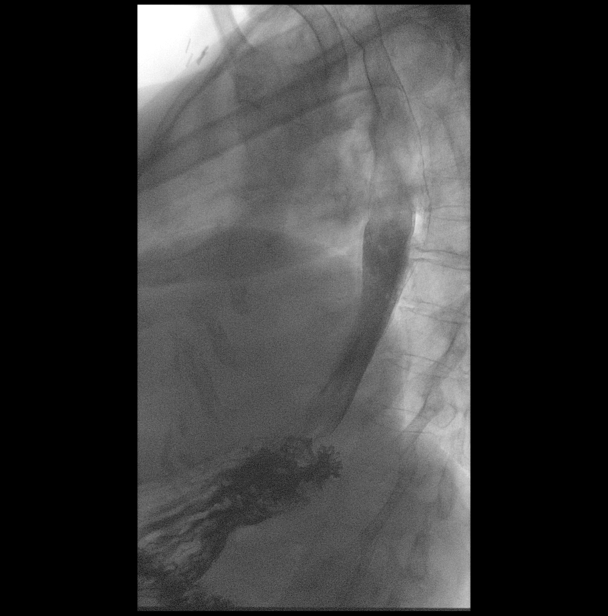

[Series 19: fluoro_barium 2fps_bw · 0.18mm/px · 1 of 1 slices shown (7 of 8)]
[im 1/1]
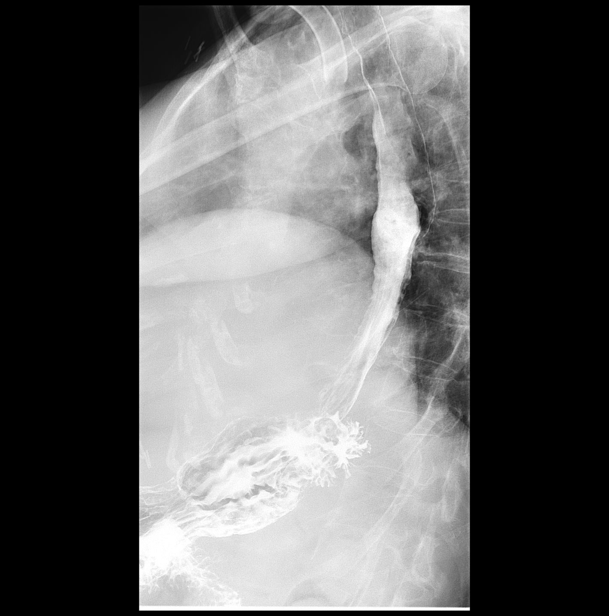

[Series 22: fluoro_barium 2fps_bw · 0.18mm/px · 1 of 1 slices shown (8 of 8)]
[im 1/1]
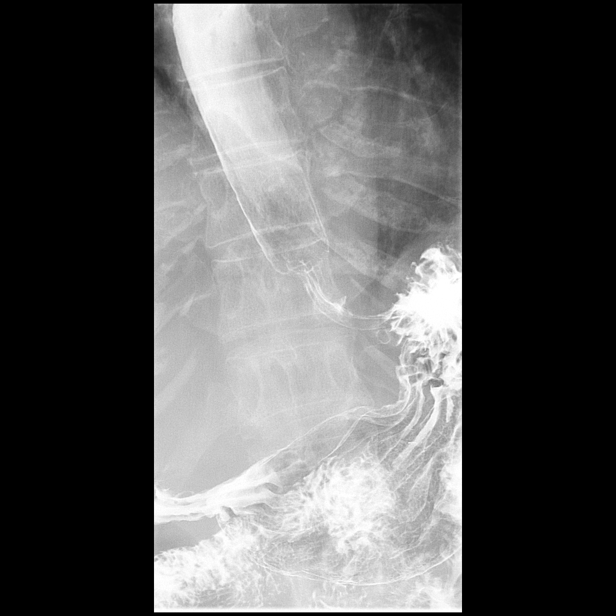

[Series 24: cp_standard · 0.29mm/px · 1 of 1 slices shown (5 of 5)]
[im 1/1]
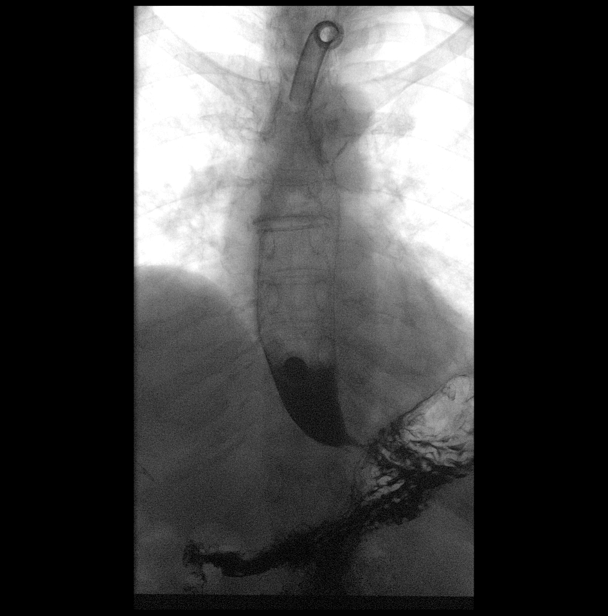

[14 of 24 positions shown; findings below may reference images not displayed]

FINDINGS: The esophagus is mildly dilated and patulous. Intermittent
non-propulsive tertiary contractions. There is no evidence of
persisting stricture, mass or focal ulceration. Rapid sequence
imaging of the esophagus in the AP and lateral projections
demonstrates no discrete mucosal abnormalities. Small hiatal hernia.

No spontaneous gastroesophageal reflux. At the conclusion of the
study, a 13 mm barium tablet was administered. This passed without
delay into the stomach.
IMPRESSION: 1. Mildly dilated, patulous esophagus.
2. Esophageal dysmotility with intermittent tertiary contractions.
3. Small hiatal hernia.

## 2020-12-26 ENCOUNTER — Inpatient Hospital Stay (HOSPITAL_BASED_OUTPATIENT_CLINIC_OR_DEPARTMENT_OTHER): Payer: Medicare Other | Admitting: Oncology

## 2020-12-26 ENCOUNTER — Encounter: Payer: Self-pay | Admitting: Oncology

## 2020-12-26 ENCOUNTER — Other Ambulatory Visit: Payer: Medicare Other

## 2020-12-26 ENCOUNTER — Other Ambulatory Visit: Payer: Self-pay

## 2020-12-26 ENCOUNTER — Ambulatory Visit: Payer: Medicare Other

## 2020-12-26 ENCOUNTER — Inpatient Hospital Stay: Payer: Medicare Other | Attending: Oncology

## 2020-12-26 VITALS — BP 117/47 | HR 80 | Temp 97.5°F | Wt 162.3 lb

## 2020-12-26 DIAGNOSIS — C50811 Malignant neoplasm of overlapping sites of right female breast: Secondary | ICD-10-CM | POA: Diagnosis not present

## 2020-12-26 DIAGNOSIS — R970 Elevated carcinoembryonic antigen [CEA]: Secondary | ICD-10-CM | POA: Insufficient documentation

## 2020-12-26 DIAGNOSIS — Z1231 Encounter for screening mammogram for malignant neoplasm of breast: Secondary | ICD-10-CM | POA: Diagnosis not present

## 2020-12-26 DIAGNOSIS — Z923 Personal history of irradiation: Secondary | ICD-10-CM | POA: Insufficient documentation

## 2020-12-26 DIAGNOSIS — C50011 Malignant neoplasm of nipple and areola, right female breast: Secondary | ICD-10-CM

## 2020-12-26 DIAGNOSIS — Z171 Estrogen receptor negative status [ER-]: Secondary | ICD-10-CM

## 2020-12-26 DIAGNOSIS — Z853 Personal history of malignant neoplasm of breast: Secondary | ICD-10-CM | POA: Insufficient documentation

## 2020-12-26 DIAGNOSIS — Z930 Tracheostomy status: Secondary | ICD-10-CM | POA: Insufficient documentation

## 2020-12-26 DIAGNOSIS — M8580 Other specified disorders of bone density and structure, unspecified site: Secondary | ICD-10-CM

## 2020-12-26 LAB — CBC WITH DIFFERENTIAL/PLATELET
Abs Immature Granulocytes: 0.06 10*3/uL (ref 0.00–0.07)
Basophils Absolute: 0 10*3/uL (ref 0.0–0.1)
Basophils Relative: 0 %
Eosinophils Absolute: 0.1 10*3/uL (ref 0.0–0.5)
Eosinophils Relative: 1 %
HCT: 39.8 % (ref 36.0–46.0)
Hemoglobin: 12.8 g/dL (ref 12.0–15.0)
Immature Granulocytes: 1 %
Lymphocytes Relative: 18 %
Lymphs Abs: 2.1 10*3/uL (ref 0.7–4.0)
MCH: 27.2 pg (ref 26.0–34.0)
MCHC: 32.2 g/dL (ref 30.0–36.0)
MCV: 84.7 fL (ref 80.0–100.0)
Monocytes Absolute: 0.6 10*3/uL (ref 0.1–1.0)
Monocytes Relative: 5 %
Neutro Abs: 8.6 10*3/uL — ABNORMAL HIGH (ref 1.7–7.7)
Neutrophils Relative %: 75 %
Platelets: 235 10*3/uL (ref 150–400)
RBC: 4.7 MIL/uL (ref 3.87–5.11)
RDW: 13 % (ref 11.5–15.5)
WBC: 11.5 10*3/uL — ABNORMAL HIGH (ref 4.0–10.5)
nRBC: 0 % (ref 0.0–0.2)

## 2020-12-26 LAB — COMPREHENSIVE METABOLIC PANEL
ALT: 13 U/L (ref 0–44)
AST: 19 U/L (ref 15–41)
Albumin: 4.2 g/dL (ref 3.5–5.0)
Alkaline Phosphatase: 69 U/L (ref 38–126)
Anion gap: 12 (ref 5–15)
BUN: 29 mg/dL — ABNORMAL HIGH (ref 8–23)
CO2: 25 mmol/L (ref 22–32)
Calcium: 9.5 mg/dL (ref 8.9–10.3)
Chloride: 97 mmol/L — ABNORMAL LOW (ref 98–111)
Creatinine, Ser: 0.98 mg/dL (ref 0.44–1.00)
GFR, Estimated: 59 mL/min — ABNORMAL LOW (ref >60)
Glucose, Bld: 116 mg/dL — ABNORMAL HIGH (ref 70–99)
Potassium: 4.5 mmol/L (ref 3.5–5.1)
Sodium: 134 mmol/L — ABNORMAL LOW (ref 135–145)
Total Bilirubin: 0.6 mg/dL (ref 0.3–1.2)
Total Protein: 8 g/dL (ref 6.5–8.1)

## 2020-12-26 NOTE — Progress Notes (Signed)
Hematology/Oncology Consult note [facility] [phone] [phone]     Clinic Day:  12/26/2020  Referring physician: Iva Boop*  Chief Complaint: Angela David is a 80 y.o. 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  Patient also received radiation in January 2003.  Dr. Kem Parkinson notes indicate the patient has had tamoxifen treatments although patient denies ever being on tamoxifen.  Patient has a history of tracheal stenosis requiring a tracheostomy in 2012. Patient had a palpable right breast mass. 06/27/2020, bilateral diagnostic mammogram showed suspicious right upper outer quadrant mass, 0.6 x 0.8 x 1.4 cm. Right upper quadrant mass biopsy showed benign fibrofatty tissue with dense fibrosis/scar, fat necrosis/dystrophic calcifications.  No evidence of residual/recurrent atypical proliferation breast disease.  10/18/2020, right breast lumpectomy showed benign breast tissue with fat necrosis/fibrosis/dystrophic calcification.  Negative for atypia and malignancy.  Patient presents to establish care with me today.  She has no new breast concerns.  She has had some focal breast soreness after procedure.  Past Medical History:  Diagnosis Date   Breast cancer, right ([place] 2002   Tx'd with chemotherapy and XRT   Chronic respiratory failure ([place])    s/p tracheostomy in  2012; on ventilatory support at hs   Diabetic peripheral neuropathy ([place]) 08/28/2016   GERD (gastroesophageal reflux disease)    Glaucoma    High cholesterol    History of colon polyps    Osteoarthritis    Personal history of chemotherapy 2002   BREAST CA   Personal history of radiation therapy 2002   BREAST CA   S/P percutaneous endoscopic gastrostomy (PEG) tube placement ([facility])    T2DM (type 2 diabetes mellitus) ([place])    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   Ventral hernia    Vocal cord paralysis 2012    Past Surgical History:  Procedure Laterality Date   ABDOMINAL HYSTERECTOMY     BREAST BIOPSY Right 2008   neg   BREAST BIOPSY Right 06/30/2020   "Q" clip-path pending   BREAST EXCISIONAL BIOPSY Right 2002   positive   BREAST LUMPECTOMY Right 2002   chemo and radiation   BREAST LUMPECTOMY WITH RADIOFREQUENCY TAG IDENTIFICATION Right 10/18/2020   Procedure: BREAST LUMPECTOMY WITH RADIOFREQUENCY TAG IDENTIFICATION;  Surgeon: Jules Husbands, MD;  Location: [facility] ORS;  Service: General;  Laterality: Right;   BREAST SURGERY Right 2002   LUMPECTOMY, radiation, chemotherapy   CATARACT EXTRACTION, BILATERAL     CHOLECYSTECTOMY N/A 12/10/2017   Procedure: LAPAROSCOPIC CHOLECYSTECTOMY;  Surgeon: Benjamine Sprague, DO;  Location: [facility] ORS;  Service: General;  Laterality: N/A;   COLON RESECTION Right 11/06/2018   Procedure: HAND ASSISTED LAPAROSCOPIC COLON RESECTION, RIGHT;  Surgeon: Jules Husbands, MD;  Location: [facility] ORS;  Service: General;  Laterality: Right;   COLONOSCOPY  2009,12/30/2013   COLONOSCOPY WITH PROPOFOL N/A 04/16/2018   Tubulovillous polyp proximal transverse colon, large sessile polyp, proximal transverse colon;  Surgeon: Robert Bellow, MD;  Location: [facility] ENDOSCOPY;  Service: Endoscopy;  Laterality: N/A;  ESOPHAGOGASTRODUODENOSCOPY (EGD) WITH PROPOFOL N/A 10/14/2018   Procedure: ESOPHAGOGASTRODUODENOSCOPY (EGD) WITH PROPOFOL;  Surgeon: Lucilla Lame, MD;  Location[facility]  ENDOSCOPY;  Service: Endoscopy;  Laterality: N/A;   TRACHEAL SURGERY  2013   TRACHEOSTOMY N/A    Dr Kathyrn Sheriff   XI ROBOTIC ASSISTED VENTRAL HERNIA N/A 10/18/2020   Procedure: XI ROBOTIC ASSISTED VENTRAL HERNIA;  Surgeon: Jules Husbands, MD;  Location: [facility] ORS;  Service: General;  Laterality: N/A;    Family History  Problem Relation Age of Onset   Cancer Father        bone cancer   Cancer Sister        lymphoma   Cancer Brother    Breast cancer Neg Hx     Social History:  reports that she has never smoked. She has never used smokeless tobacco. She reports that she does not drink alcohol and does not use drugs.  She denies any smoking. She worked in [employer] (medical) for 26 years. She is married and her husband's name is [name].  She lives in [place].   Allergies:  Allergies  Allergen Reactions   Shellfish Allergy Shortness Of Breath    respitatory distress    Sodium Sulfite Shortness Of Breath    Severe Congestion that creates inability to breath   Sulfa Antibiotics Shortness Of Breath and Other (See Comments)    respitatory distress   Codeine Other (See Comments)    Per patient "it kept her up all night"    Current Medications: Current Outpatient Medications  Medication Sig Dispense Refill   acetaminophen (TYLENOL) 500 MG tablet Take 1,000 mg by mouth in the morning and at bedtime.     busPIRone (BUSPAR) 15 MG tablet Place 15 mg into feeding tube 2 (two) times daily.     carboxymethylcellulose (REFRESH PLUS) 0.5 % SOLN Place 1 drop into both eyes 3 (three) times daily as needed (dry eyes).     denosumab (PROLIA) 60 MG/ML SOSY injection Inject 60 mg into the skin every 6 (six) months.     escitalopram (LEXAPRO) 10 MG tablet Place 3 tablets (30 mg total) into feeding tube daily. (Patient taking differently: Place 10 mg into feeding tube daily.)     escitalopram (LEXAPRO) 20 MG tablet Place 20 mg into feeding tube daily.     fluconazole (DIFLUCAN) 150 MG tablet Place 150 mg into feeding  tube once a week.     folic acid (FOLVITE) 1 MG tablet Place 1 tablet (1 mg total) into feeding tube daily.     gabapentin (NEURONTIN) 600 MG tablet Place 600 mg into feeding tube 3 (three) times daily.     HYDROcodone-acetaminophen (NORCO/VICODIN) 5-325 MG tablet Take 1-2 tablets by mouth every 6 (six) hours as needed for moderate pain. 20 tablet 0   lovastatin (MEVACOR) 20 MG tablet Place 20 mg into feeding tube daily.     melatonin 5 MG TABS Place 1 tablet (5 mg total) into feeding tube at bedtime.  0   metFORMIN (GLUCOPHAGE) 1000 MG tablet Place 1,000 mg into feeding tube daily with breakfast.     metoprolol tartrate (LOPRESSOR) 25 MG tablet Place 25 mg into feeding tube daily.     Multiple Vitamins-Minerals (HAIR/SKIN/NAILS) TABS Take 1 tablet by mouth daily.     pantoprazole (PROTONIX) 40 MG tablet Take 40 mg by mouth daily.     rOPINIRole (REQUIP) 0.5 MG tablet Place 1 tablet (0.5 mg total) into feeding tube at bedtime.     No current facility-administered  medications for this visit.    Review of Systems  Constitutional:  Positive for malaise/fatigue. Negative for chills, fever and weight loss.  HENT:  Negative for congestion, ear pain and tinnitus.   Eyes: Negative.  Negative for blurred vision and double vision.  Respiratory:  Negative for cough, sputum production and shortness of breath.        Tracheostomy in 2012  Cardiovascular: Negative.  Negative for chest pain, palpitations and leg swelling.  Gastrointestinal: Negative.  Negative for abdominal pain, constipation, diarrhea, nausea and vomiting.  Genitourinary:  Negative for dysuria, frequency and urgency.  Musculoskeletal:  Negative for back pain and falls.  Skin: Negative.  Negative for rash.  Neurological:  Negative for weakness and headaches.  Endo/Heme/Allergies: Negative.  Does not bruise/bleed easily.  Psychiatric/Behavioral:  Negative for depression. The patient is not nervous/anxious and does not have insomnia.     Performance status (ECOG): 2  Vitals Blood pressure (!) 117/47, pulse 80, temperature (!) 97.5 F (36.4 C), temperature source Tympanic, weight 162 lb 4.8 oz (73.6 kg).   Physical Exam Vitals and nursing note reviewed.  Constitutional:      General: She is not in acute distress.    Appearance: She is well-developed. She is not diaphoretic.     Comments: Patient sits in the wheelchair  HENT:     Head: Normocephalic and atraumatic.     Comments: Short gray hair.    Mouth/Throat:     Mouth: Mucous membranes are moist.     Pharynx: Oropharynx is clear. No oropharyngeal exudate.  Eyes:     General: No scleral icterus.    Conjunctiva/sclera: Conjunctivae normal.     Pupils: Pupils are equal, round, and reactive to light.     Comments: Glasses.  Brown eyes.  Neck:     Comments: Tracheostomy. Cardiovascular:     Rate and Rhythm: Normal rate and regular rhythm.     Heart sounds: Normal heart sounds. No murmur heard. Pulmonary:     Effort: Pulmonary effort is normal. No respiratory distress.     Breath sounds: Normal breath sounds. No wheezing or rales.  Chest:     Chest wall: No tenderness.  Breasts:    Right: Skin change (Small pea sized mass along lumpectomy incision at 11oclock) and tenderness present. No inverted nipple, mass or nipple discharge.     Left: No inverted nipple, mass, nipple discharge, skin change or tenderness.  Abdominal:     General: Bowel sounds are normal. There is no distension.     Palpations: Abdomen is soft. There is no mass.     Tenderness: There is no abdominal tenderness. There is no guarding or rebound.  Musculoskeletal:        General: No swelling or tenderness. Normal range of motion.     Cervical back: Normal range of motion and neck supple.     Right lower leg: No edema.     Left lower leg: No edema.  Lymphadenopathy:     Head:     Right side of head: No preauricular, posterior auricular or occipital adenopathy.     Left side of head: No  preauricular, posterior auricular or occipital adenopathy.     Cervical: No cervical adenopathy.     Upper Body:     Right upper body: No supraclavicular or axillary adenopathy.     Left upper body: No supraclavicular or axillary adenopathy.     Lower Body: No right inguinal adenopathy. No left inguinal adenopathy.  Skin:  General: Skin is warm and dry.  Neurological:     Mental Status: She is alert and oriented to person, place, and time.  Psychiatric:        Mood and Affect: Mood normal.   Breast examination reviewed right breast status post lumpectomy, scarring tissue at the lumpectomy site.  Decreased right breast size due to previous surgery.  No palpable breast mass bilaterally.No palpable axillary lymphadenopathy bilaterally.  Orders Only on 12/26/2020  Component Date Value Ref Range Status   Sodium 12/26/2020 134 (A)  135 - 145 mmol/L Final   Potassium 12/26/2020 4.5  3.5 - 5.1 mmol/L Final   Chloride 12/26/2020 97 (A)  98 - 111 mmol/L Final   CO2 12/26/2020 25  22 - 32 mmol/L Final   Glucose, Bld 12/26/2020 116 (A)  70 - 99 mg/dL Final   Glucose reference range applies only to samples taken after fasting for at least 8 hours.   BUN 12/26/2020 29 (A)  8 - 23 mg/dL Final   Creatinine, Ser 12/26/2020 0.98  0.44 - 1.00 mg/dL Final   Calcium 12/26/2020 9.5  8.9 - 10.3 mg/dL Final   Total Protein 12/26/2020 8.0  6.5 - 8.1 g/dL Final   Albumin 12/26/2020 4.2  3.5 - 5.0 g/dL Final   AST 12/26/2020 19  15 - 41 U/L Final   ALT 12/26/2020 13  0 - 44 U/L Final   Alkaline Phosphatase 12/26/2020 69  38 - 126 U/L Final   Total Bilirubin 12/26/2020 0.6  0.3 - 1.2 mg/dL Final   GFR, Estimated 12/26/2020 59 (A)  >60 mL/min Final   Comment: (NOTE) Calculated using the CKD-EPI Creatinine Equation (2021)    Anion gap 12/26/2020 12  5 - 15 Final   Performed at [facility], [place], [place] [address]   WBC 12/26/2020 11.5 (A)  4.0 - 10.5 K/uL Final   RBC 12/26/2020  4.70  3.87 - 5.11 MIL/uL Final   Hemoglobin 12/26/2020 12.8  12.0 - 15.0 g/dL Final   HCT 12/26/2020 39.8  36.0 - 46.0 % Final   MCV 12/26/2020 84.7  80.0 - 100.0 fL Final   MCH 12/26/2020 27.2  26.0 - 34.0 pg Final   MCHC 12/26/2020 32.2  30.0 - 36.0 g/dL Final   RDW 12/26/2020 13.0  11.5 - 15.5 % Final   Platelets 12/26/2020 235  150 - 400 K/uL Final   nRBC 12/26/2020 0.0  0.0 - 0.2 % Final   Neutrophils Relative % 12/26/2020 75  % Final   Neutro Abs 12/26/2020 8.6 (A)  1.7 - 7.7 K/uL Final   Lymphocytes Relative 12/26/2020 18  % Final   Lymphs Abs 12/26/2020 2.1  0.7 - 4.0 K/uL Final   Monocytes Relative 12/26/2020 5  % Final   Monocytes Absolute 12/26/2020 0.6  0.1 - 1.0 K/uL Final   Eosinophils Relative 12/26/2020 1  % Final   Eosinophils Absolute 12/26/2020 0.1  0.0 - 0.5 K/uL Final   Basophils Relative 12/26/2020 0  % Final   Basophils Absolute 12/26/2020 0.0  0.0 - 0.1 K/uL Final   Immature Granulocytes 12/26/2020 1  % Final   Abs Immature Granulocytes 12/26/2020 0.06  0.00 - 0.07 K/uL Final   Performed at [facility], [address] [place], [place] [address]    Assessment and plan:   1. Carcinoma of overlapping sites of right breast in female, estrogen receptor negative ([place])   2. 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 Screening mammogram for breast cancer   4. Osteopenia, unspecified location      #Remote history of stage II invasive right breast cancer, ER-, PR+, HER2- Patient is clinically doing well.  No signs of recurrence. Almost 20 years post treatment. We had a discussion about limitation of CA 27-29, CEA in breast cancer surveillance. I recommend annual screening mammogram if patient desires to continue.  We also discussed about increase her interval of follow-up to annually instead of every 6 months.  She agrees with the plan . # chronically elevated CEA.  Previous colonoscopy/PET negative.  CEA  has not been followed recently. Transverse colon polyp, patient is s/p right laparoscopic right hemicolectomy on 11/06/2018 Pathology reviewed- tubular adenoma.  #Osteopenia Patient has previously received Prolia every 6 months.  Last dose was 06/23/2020.  Patient is not on any cancer treatment which will affect her bone density. Repeat bone density.  Continue calcium and vitamin D supplementation.  RTC in 12 months for MD assessment, labs (CBC with diff, CMP, CA27.29)   We spent sufficient time to discuss many aspect of care, questions were answered to patient's satisfaction. A total of 40 minutes was spent on this visit.  With 10 minutes spent reviewing image findings, pathology reports, 25 minutes counseling the patient on the diagnosis, goal of care, chemotherapy treatments, side effects of the treatment, management of symptoms.  Additional 5 minutes was spent on answering patient's questions.   Earlie Server, MD, PhD Hematology Oncology [place] at [facility] 12/26/2020

## 2020-12-27 ENCOUNTER — Telehealth: Payer: Self-pay

## 2020-12-27 DIAGNOSIS — M85851 Other specified disorders of bone density and structure, right thigh: Secondary | ICD-10-CM

## 2020-12-27 DIAGNOSIS — C50011 Malignant neoplasm of nipple and areola, right female breast: Secondary | ICD-10-CM

## 2020-12-27 DIAGNOSIS — M8580 Other specified disorders of bone density and structure, unspecified site: Secondary | ICD-10-CM

## 2020-12-27 DIAGNOSIS — M85852 Other specified disorders of bone density and structure, left thigh: Secondary | ICD-10-CM

## 2020-12-27 LAB — CANCER ANTIGEN 27.29: CA 27.29: 25.1 U/mL (ref 0.0–38.6)

## 2020-12-27 NOTE — Telephone Encounter (Signed)
-----   Message from Earlie Server, MD sent at 12/26/2020 10:30 PM EDT ----- Please let patient know that I reviewed more of her previous records, she had prolia injection this April.  I will repeat DEXA, please arrange. Thanks.

## 2020-12-27 NOTE — Telephone Encounter (Signed)
Pt informed. Please schedule DEXA scan and notify pt of appt. Thanks.

## 2020-12-27 NOTE — Telephone Encounter (Signed)
Scheduled and patient notified.

## 2021-01-17 IMAGING — CT CT CHEST W/ CM
3 of 6 series · 13 of 36 positions shown, 15 images · IV contrast (omnipaque)
Comparison: Radiography same day

CLINICAL DATA: Falling injury today with right-sided pain.

EXAM:
CT CHEST, ABDOMEN, AND PELVIS WITH CONTRAST
TECHNIQUE: Multidetector CT imaging of the chest, abdomen and pelvis was
performed following the standard protocol during bolus
administration of intravenous contrast.
CONTRAST:  100mL OMNIPAQUE IOHEXOL 300 MG/ML  SOLN

[Series 2: cap with · axial · 0.75mm/px · z∈[-1332,-847]mm · 8 of 125 slices shown, 10 images]
[im 14/125  mediastinal]
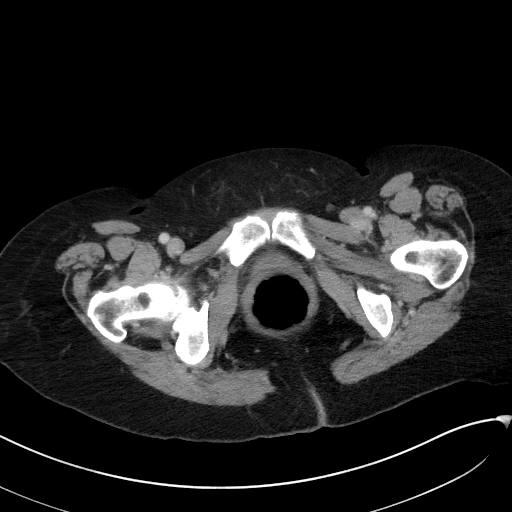
[im 14/125  lung]
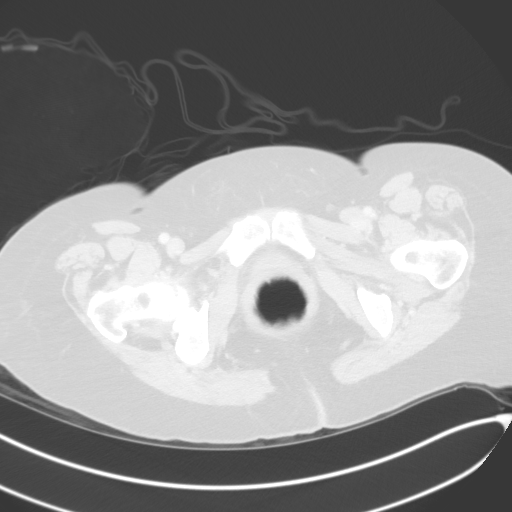
[im 28/125  lung]
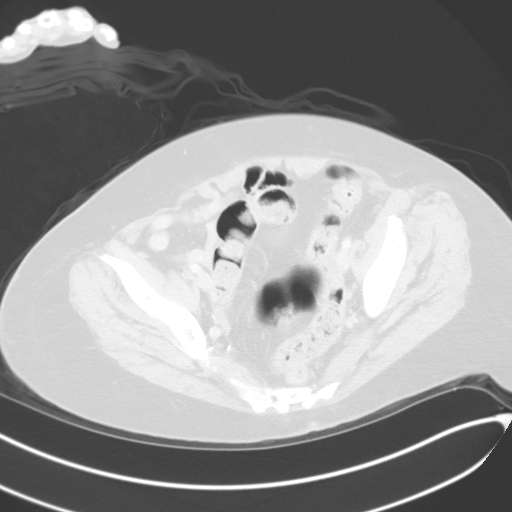
[im 42/125  lung]
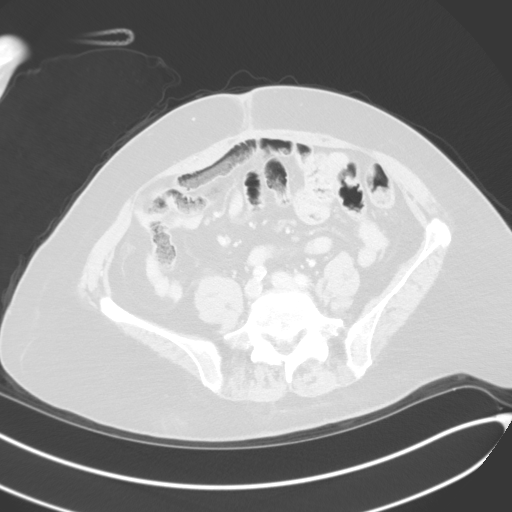
[im 56/125  lung]
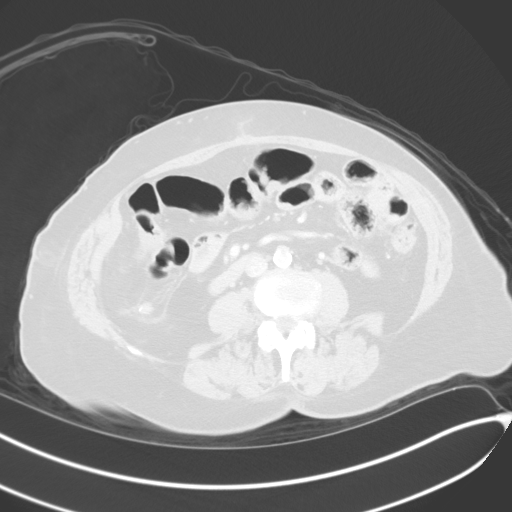
[im 69/125  mediastinal]
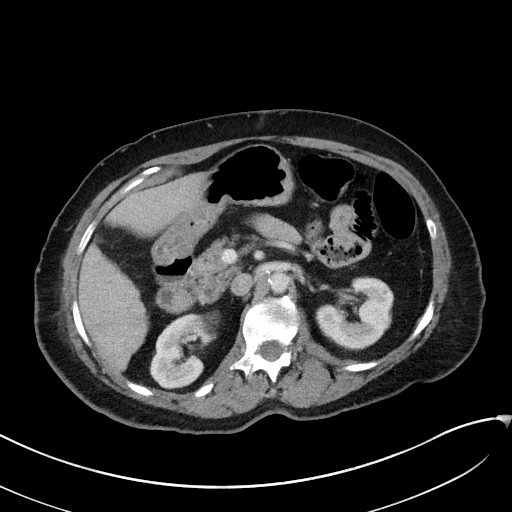
[im 69/125  lung]
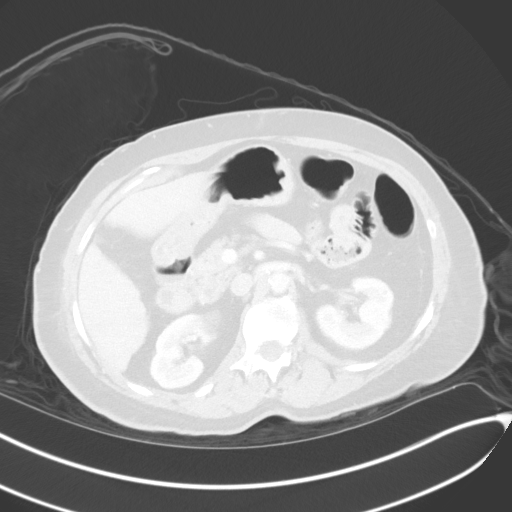
[im 83/125  lung]
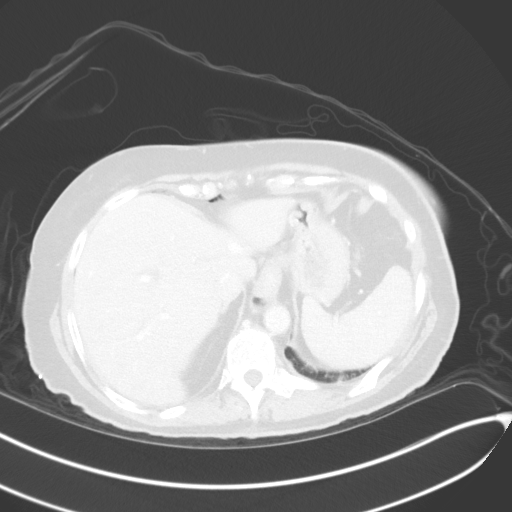
[im 97/125  lung]
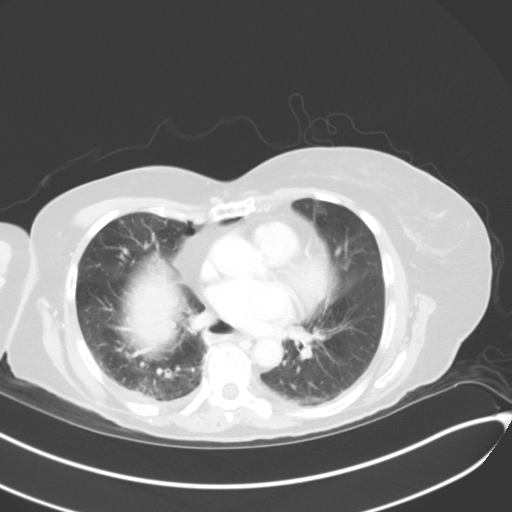
[im 111/125  lung]
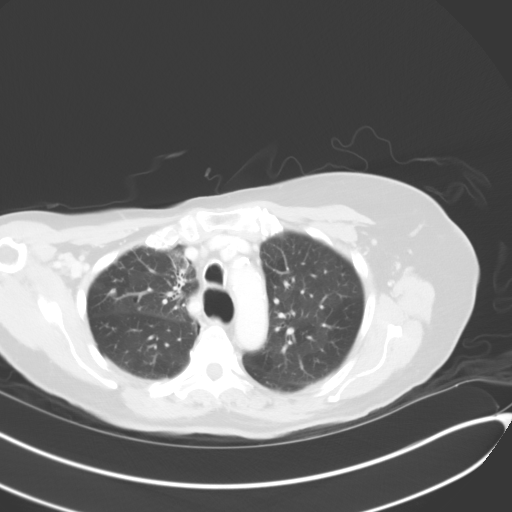

[Series 6: lung · axial · 0.75mm/px · z∈[-973,-945]mm · 2 of 112 slices shown]
[im 14/112  lung]
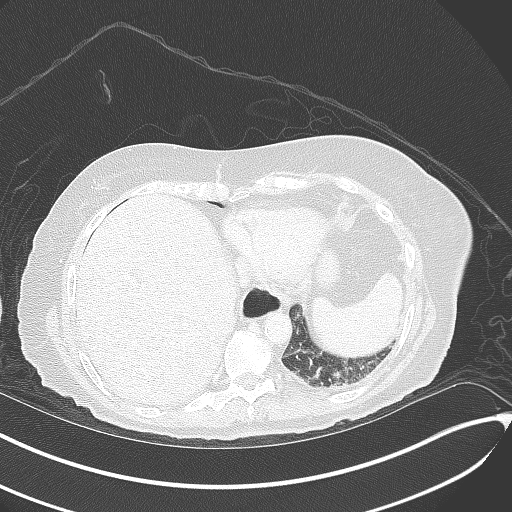
[im 28/112  lung]
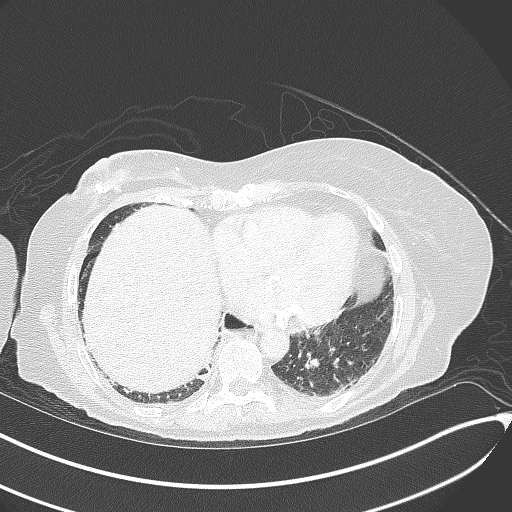

[Series 7: coronals · coronal · 0.66mm/px · 3 of 128 slices shown]
[im 26/128  lung]
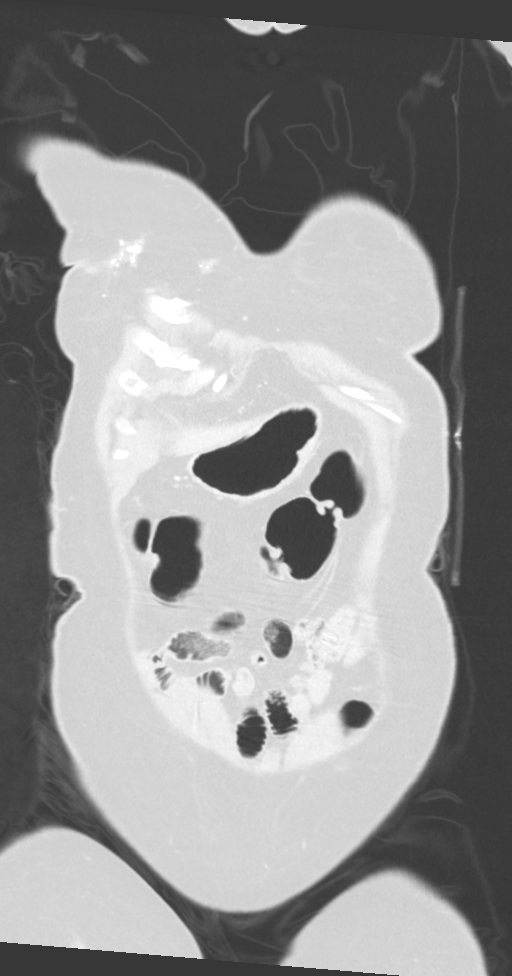
[im 51/128  lung]
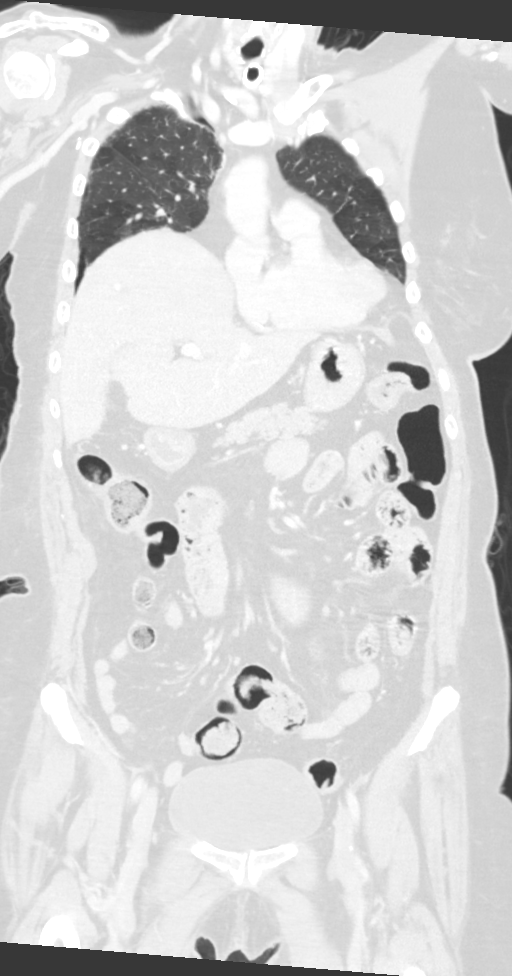
[im 77/128  lung]
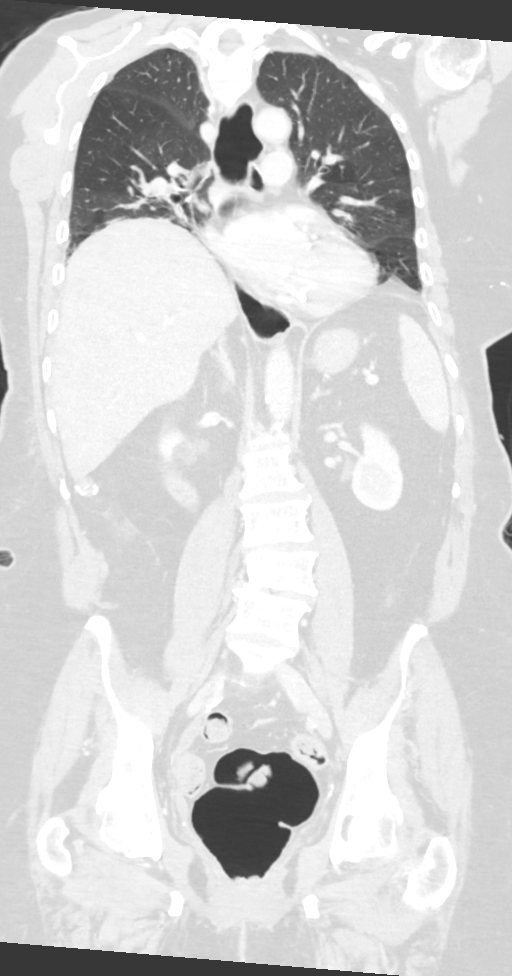

[13 of 36 positions shown; findings below may reference images not displayed]

FINDINGS: CT CHEST FINDINGS

Cardiovascular: Heart size is normal. There is coronary artery
calcification. There is aortic atherosclerotic calcification. No
acute vascular injury is seen. No visible pulmonary emboli.

Mediastinum/Nodes: No mediastinal or hilar mass or lymphadenopathy.
Tracheostomy in place.

Lungs/Pleura: Small pneumothorax on the right, less than 5%. Small
amount of right pleural fluid. Scarring of the anterior right upper
lobe. Scattered pulmonary nodules in the right lung unchanged since
3421 and therefore benign.

Musculoskeletal: Fractures of the right fifth posterior rib with
mild displacement, sixth posterior rib with mild displacement and
nondisplaced lateral rib, seventh posterior rib with mild
displacement and nondisplaced lateral rib, eighth posterior rib with
mild displacement and nondisplaced lateral rib, ninth posterior rib
with mild displacement and nondisplaced lateral rib, and tenth
lateral rib, nondisplaced. All posterior rib fractures are mildly
displaced. No suspicion of acute spinal fracture. Mild chronic
appearing loss of height at T5, T6 and T7.

CT ABDOMEN PELVIS FINDINGS

Hepatobiliary: No evidence of liver injury. Previous
cholecystectomy.

Pancreas: Normal

Spleen: Normal

Adrenals/Urinary Tract: Adrenal glands are normal. Right kidney
shows a lesion at the medial right lower pole measuring 3.3 x
cm. In 9839, this looks like a simple cyst measuring 1.8 x 2.2 cm.
This could be hemorrhagic cyst or a mass lesion. Left kidney is
negative. No bladder abnormality.

Stomach/Bowel: No acute or traumatic bowel finding. Previous partial
resection in the right colon.

Vascular/Lymphatic: Aortic atherosclerosis. No aneurysm. IVC is
normal. No retroperitoneal adenopathy.

Reproductive: Previous hysterectomy.  No pelvic mass.

Other: No free fluid or air.

Musculoskeletal: Lumbar curvature in degenerative changes. No acute
lumbar spine fracture. No acute pelvic fracture.
IMPRESSION: Displaced fractures of the posterior right fifth through ninth ribs.
Nondisplaced lateral fractures of the right sixth through tenth
ribs. Right pneumothorax, less than 5%. Small amount of right
pleural fluid.

Indeterminate lesion of the lower pole the right kidney that could
represent a hemorrhagic cyst or mass. This is larger than was seen
in 9839. Follow-up suggested with renal MRI.

Aortic atherosclerosis.  Coronary artery calcification.

Chronic scarring of the anterior right upper lobe with chronic
benign pulmonary nodules on the right.

## 2021-01-17 IMAGING — CT CT ABD-PELV W/ CM
3 of 6 series · 13 of 36 positions shown, 15 images · IV contrast (omnipaque)
Comparison: Radiography same day

CLINICAL DATA: Falling injury today with right-sided pain.

EXAM:
CT CHEST, ABDOMEN, AND PELVIS WITH CONTRAST
TECHNIQUE: Multidetector CT imaging of the chest, abdomen and pelvis was
performed following the standard protocol during bolus
administration of intravenous contrast.
CONTRAST:  100mL OMNIPAQUE IOHEXOL 300 MG/ML  SOLN

[Series 2: cap with · axial · 0.75mm/px · z∈[-1332,-847]mm · 8 of 125 slices shown, 10 images]
[im 14/125  mediastinal]
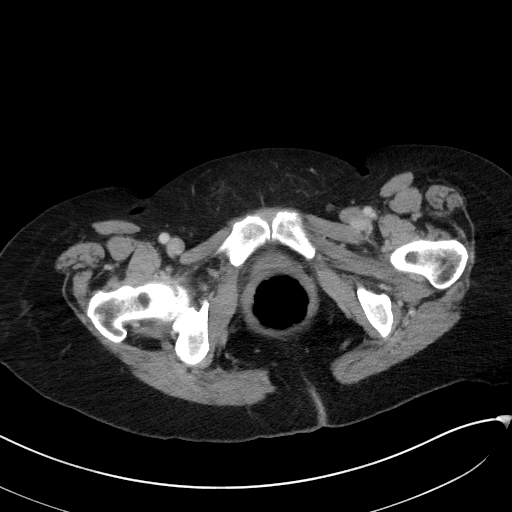
[im 14/125  lung]
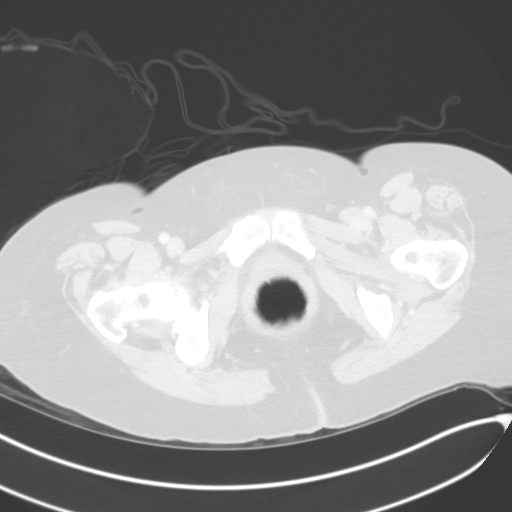
[im 28/125  lung]
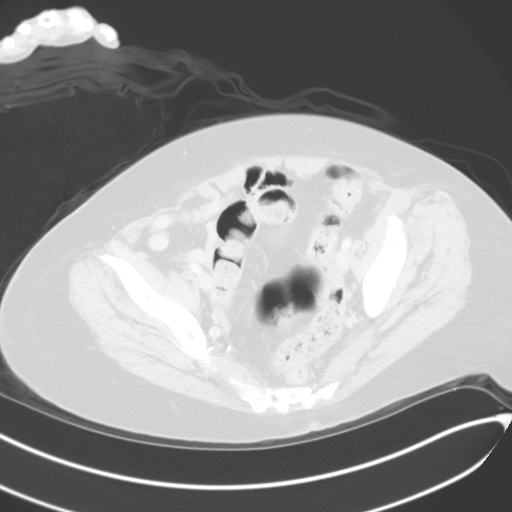
[im 42/125  lung]
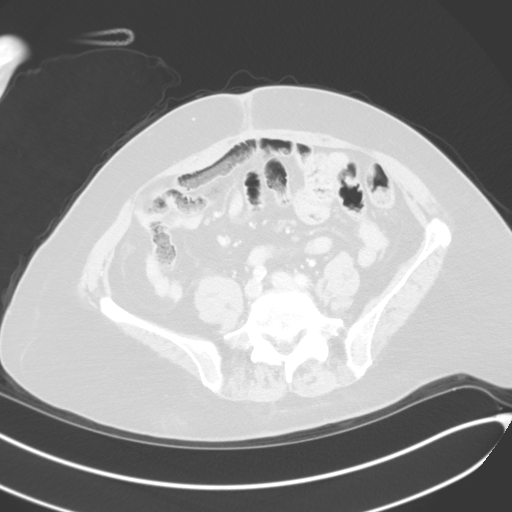
[im 56/125  lung]
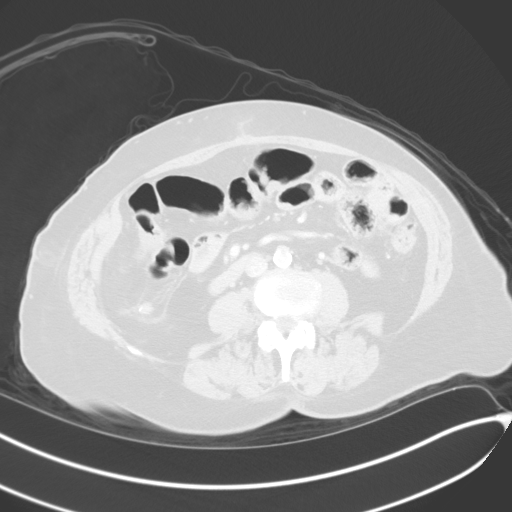
[im 69/125  mediastinal]
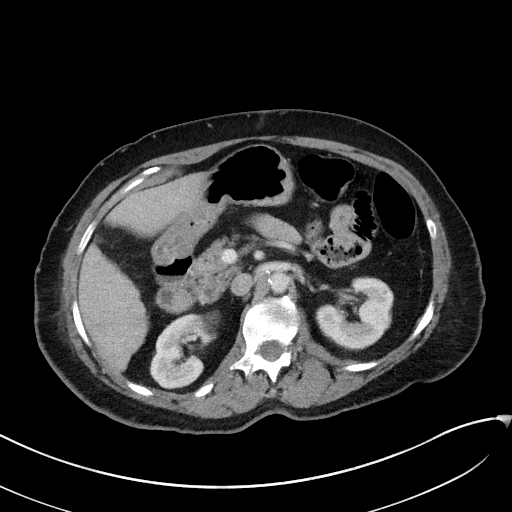
[im 69/125  lung]
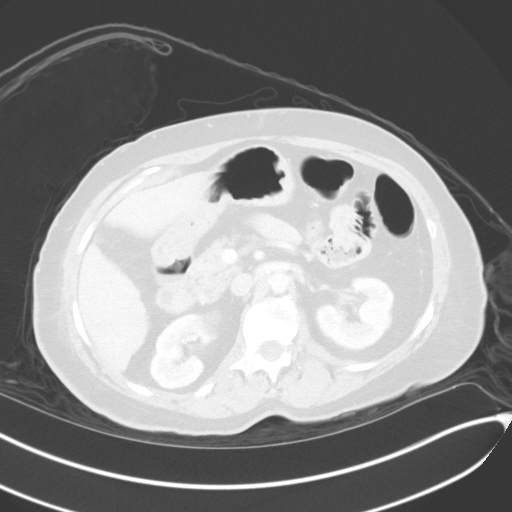
[im 83/125  lung]
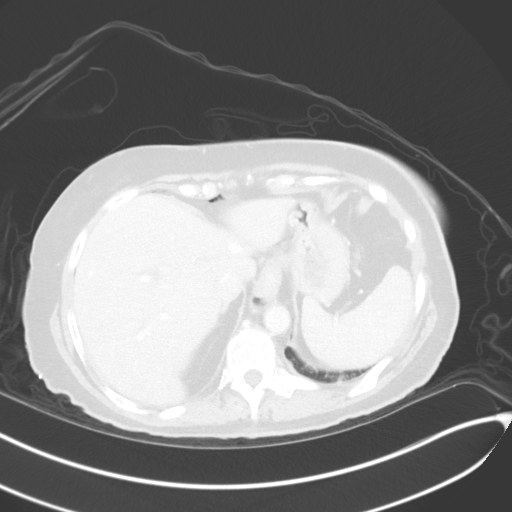
[im 97/125  lung]
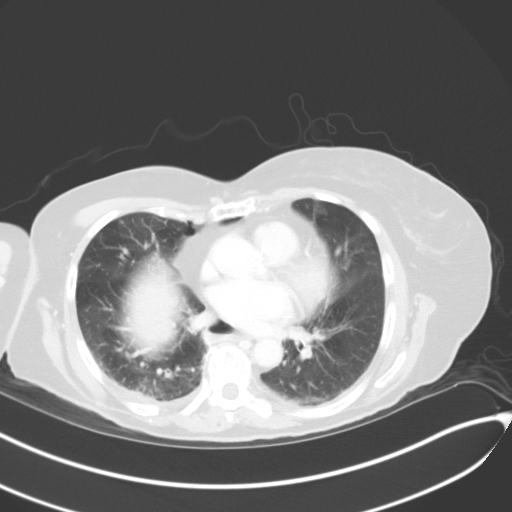
[im 111/125  lung]
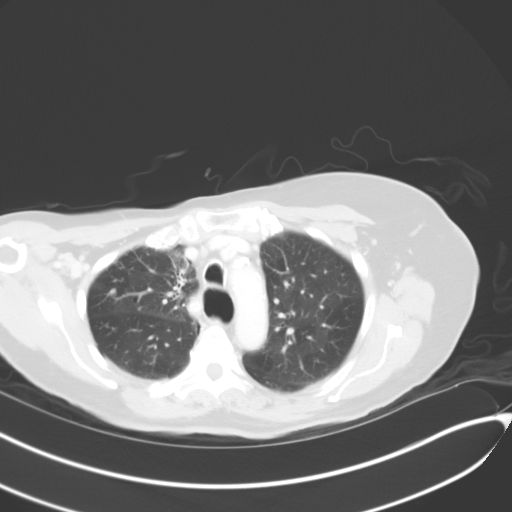

[Series 6: lung · axial · 0.75mm/px · z∈[-973,-945]mm · 2 of 112 slices shown]
[im 14/112  lung]
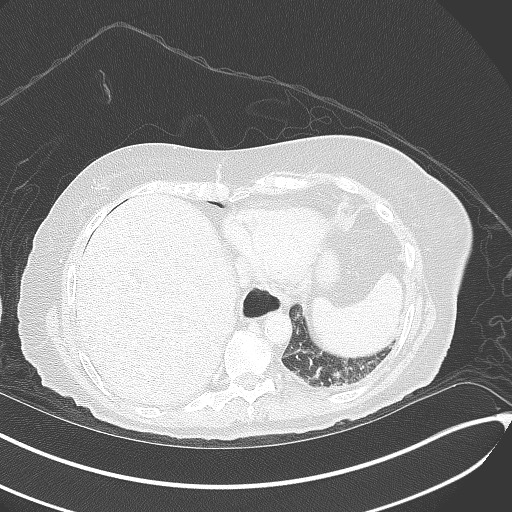
[im 28/112  lung]
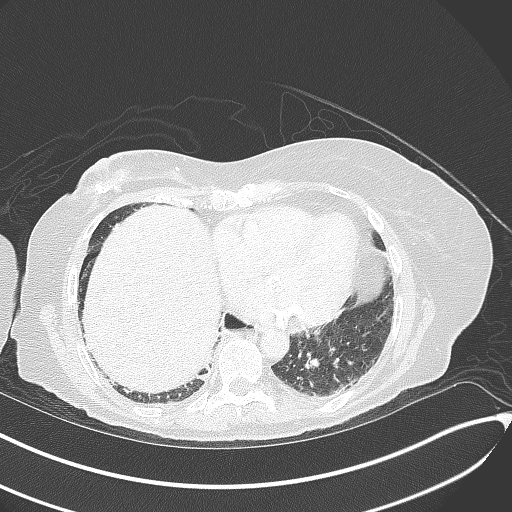

[Series 7: coronals · coronal · 0.66mm/px · 3 of 128 slices shown]
[im 26/128  lung]
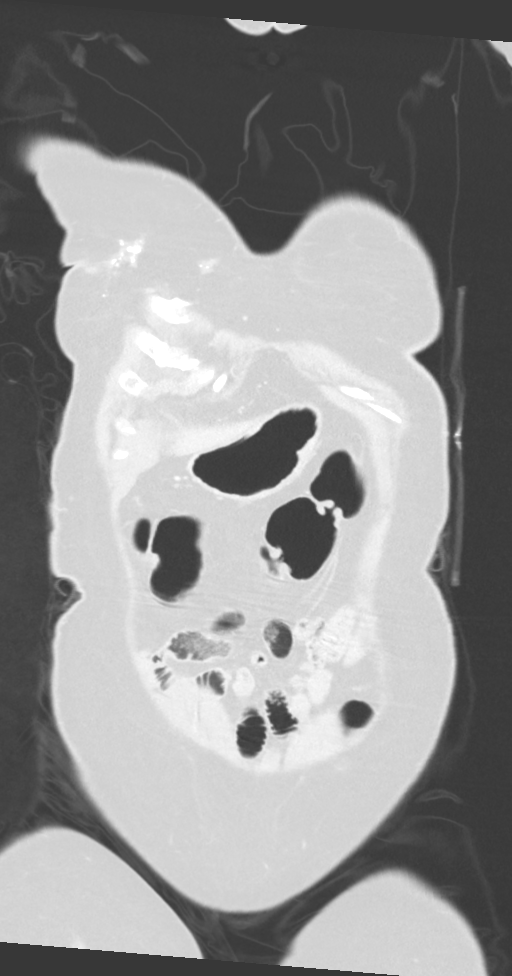
[im 51/128  lung]
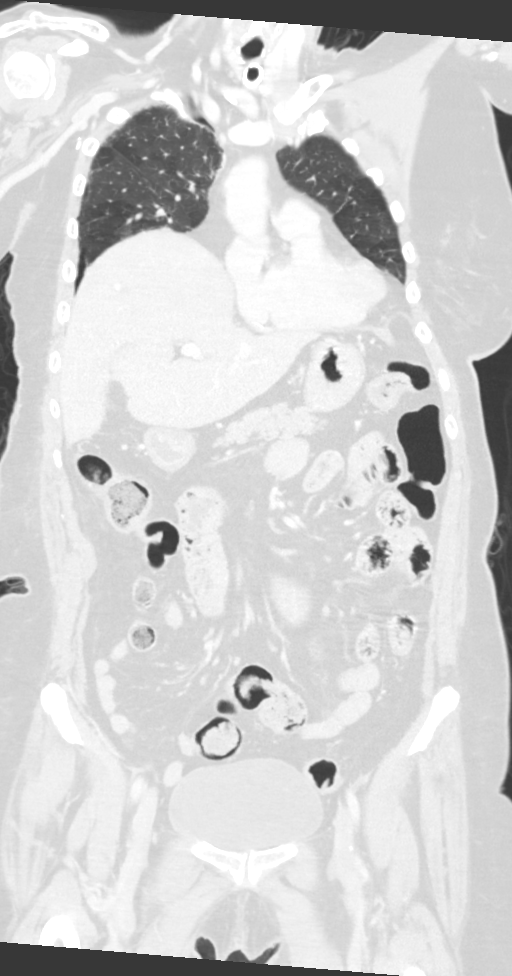
[im 77/128  lung]
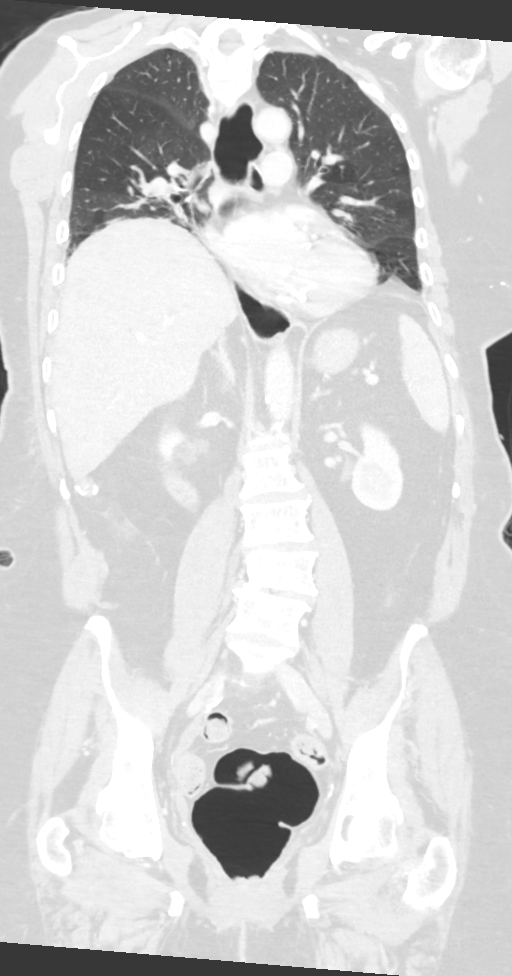

[13 of 36 positions shown; findings below may reference images not displayed]

FINDINGS: CT CHEST FINDINGS

Cardiovascular: Heart size is normal. There is coronary artery
calcification. There is aortic atherosclerotic calcification. No
acute vascular injury is seen. No visible pulmonary emboli.

Mediastinum/Nodes: No mediastinal or hilar mass or lymphadenopathy.
Tracheostomy in place.

Lungs/Pleura: Small pneumothorax on the right, less than 5%. Small
amount of right pleural fluid. Scarring of the anterior right upper
lobe. Scattered pulmonary nodules in the right lung unchanged since
3421 and therefore benign.

Musculoskeletal: Fractures of the right fifth posterior rib with
mild displacement, sixth posterior rib with mild displacement and
nondisplaced lateral rib, seventh posterior rib with mild
displacement and nondisplaced lateral rib, eighth posterior rib with
mild displacement and nondisplaced lateral rib, ninth posterior rib
with mild displacement and nondisplaced lateral rib, and tenth
lateral rib, nondisplaced. All posterior rib fractures are mildly
displaced. No suspicion of acute spinal fracture. Mild chronic
appearing loss of height at T5, T6 and T7.

CT ABDOMEN PELVIS FINDINGS

Hepatobiliary: No evidence of liver injury. Previous
cholecystectomy.

Pancreas: Normal

Spleen: Normal

Adrenals/Urinary Tract: Adrenal glands are normal. Right kidney
shows a lesion at the medial right lower pole measuring 3.3 x
cm. In 9839, this looks like a simple cyst measuring 1.8 x 2.2 cm.
This could be hemorrhagic cyst or a mass lesion. Left kidney is
negative. No bladder abnormality.

Stomach/Bowel: No acute or traumatic bowel finding. Previous partial
resection in the right colon.

Vascular/Lymphatic: Aortic atherosclerosis. No aneurysm. IVC is
normal. No retroperitoneal adenopathy.

Reproductive: Previous hysterectomy.  No pelvic mass.

Other: No free fluid or air.

Musculoskeletal: Lumbar curvature in degenerative changes. No acute
lumbar spine fracture. No acute pelvic fracture.
IMPRESSION: Displaced fractures of the posterior right fifth through ninth ribs.
Nondisplaced lateral fractures of the right sixth through tenth
ribs. Right pneumothorax, less than 5%. Small amount of right
pleural fluid.

Indeterminate lesion of the lower pole the right kidney that could
represent a hemorrhagic cyst or mass. This is larger than was seen
in 9839. Follow-up suggested with renal MRI.

Aortic atherosclerosis.  Coronary artery calcification.

Chronic scarring of the anterior right upper lobe with chronic
benign pulmonary nodules on the right.

## 2021-01-17 IMAGING — CR DG RIBS W/ CHEST 3+V*R*
3 series · 3 of 3 positions shown · non-contrast
Comparison: Chest x-ray 02/09/2017

CLINICAL DATA: Fell.  Right rib pain.

EXAM:
RIGHT RIBS AND CHEST - 3+ VIEW

[chest pa]
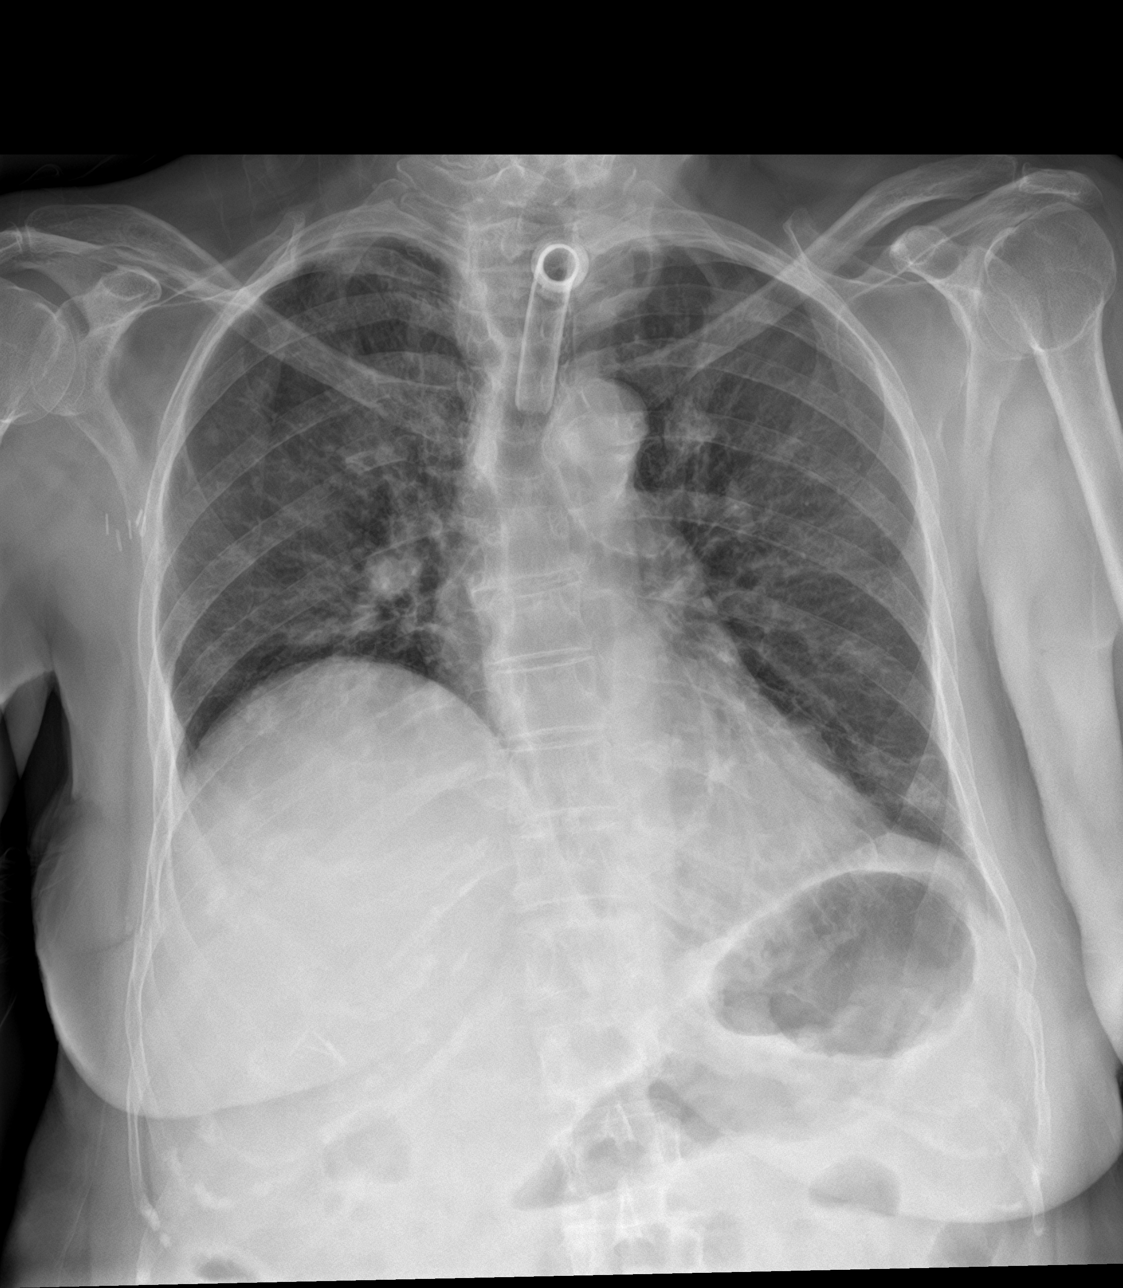

[rib ap]
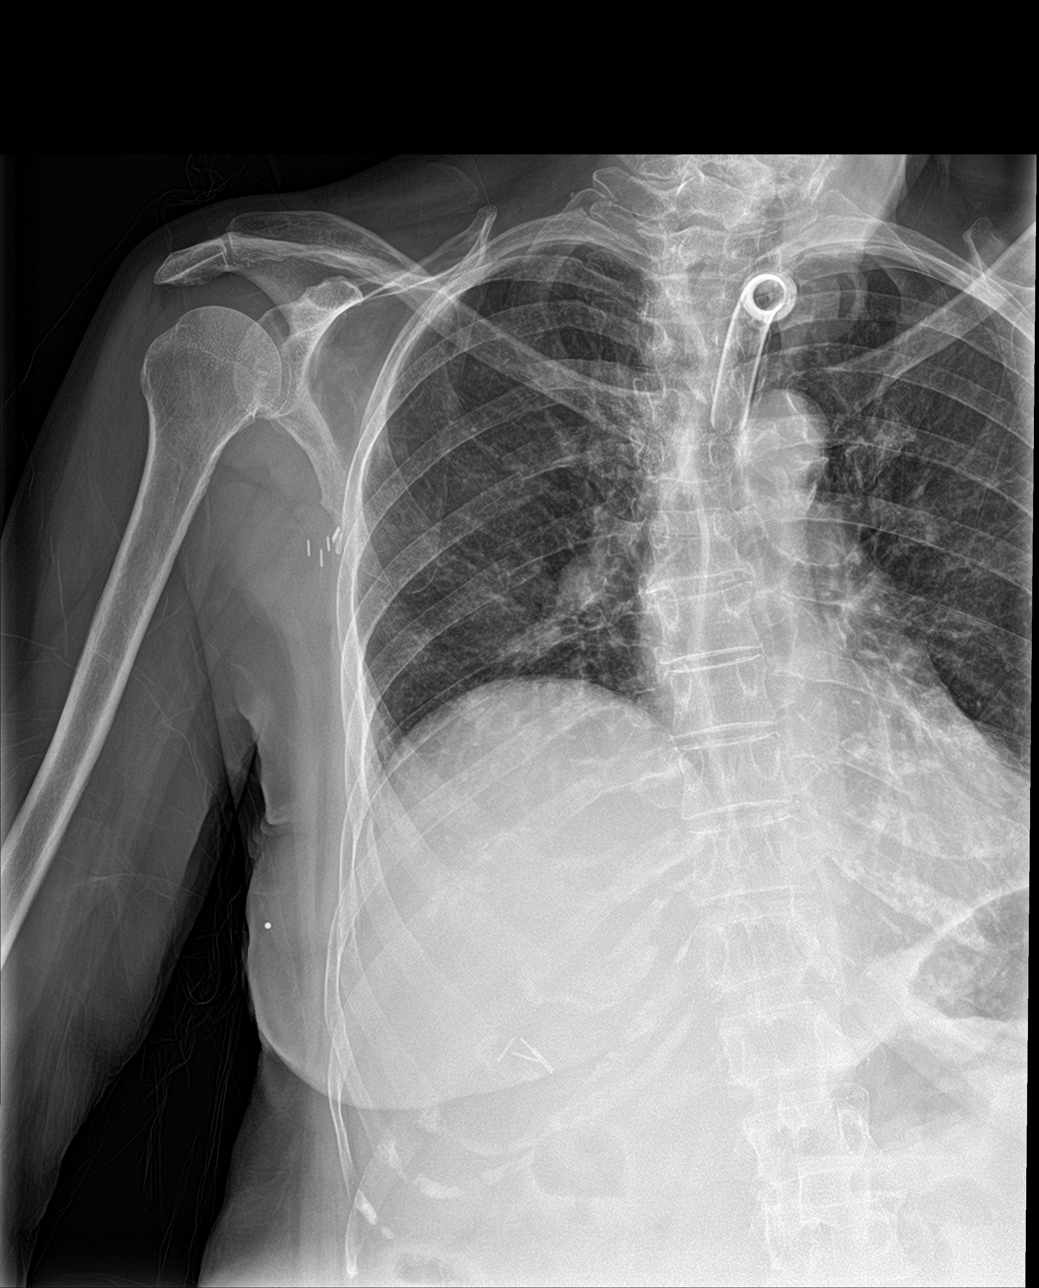

[rib ap obl]
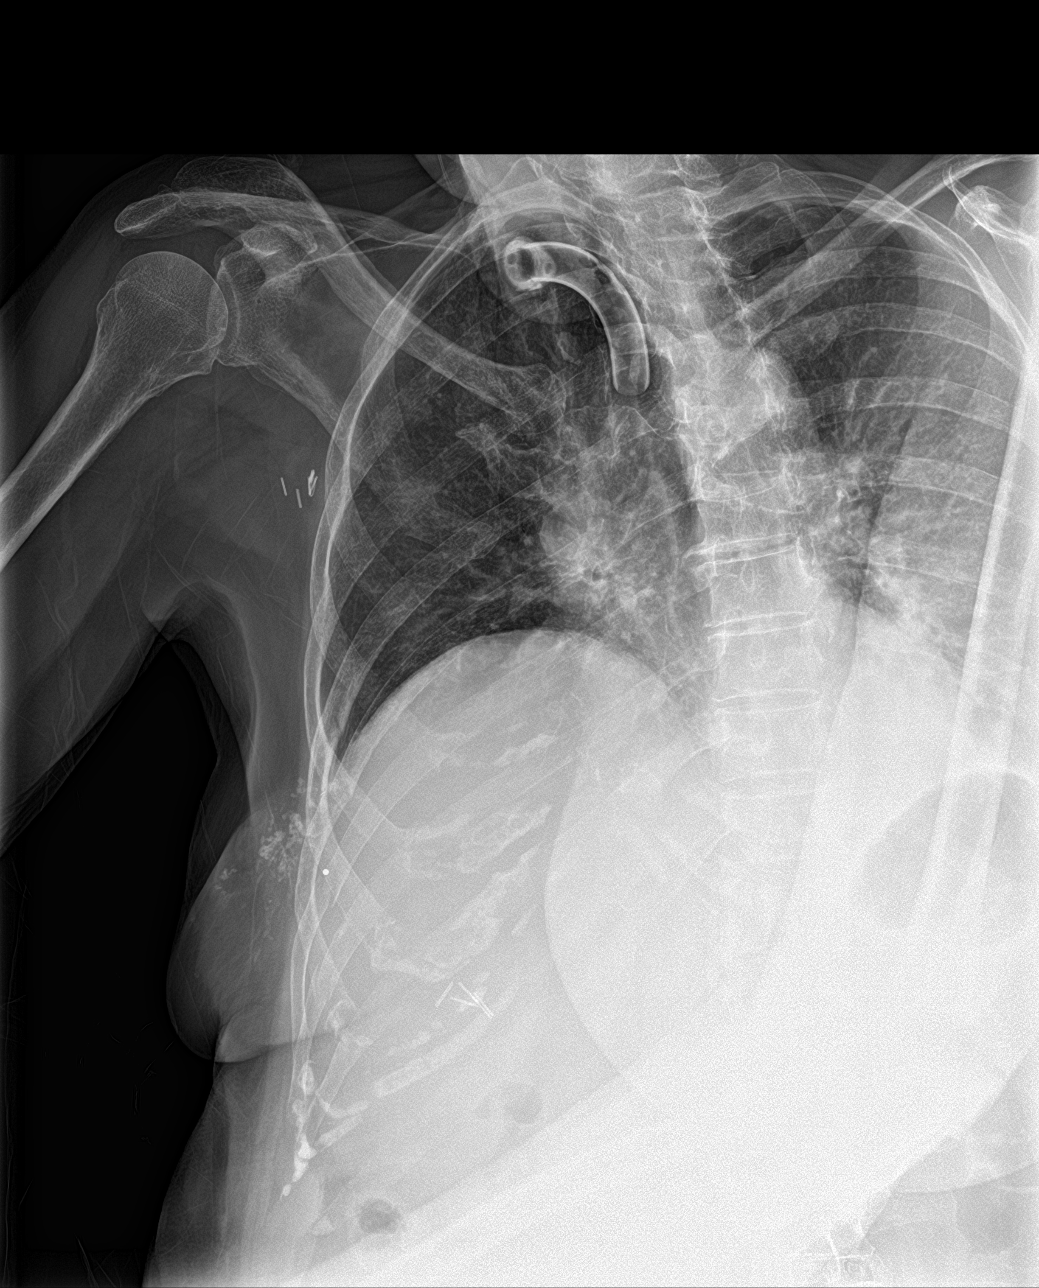

[3 of 3 positions shown; findings below may reference images not displayed]

FINDINGS: The tracheostomy tube is in good position at the mid tracheal level.
Stable marked eventration of the right hemidiaphragm with overlying
vascular crowding. Chronic emphysematous changes and pulmonary
scarring. No acute pulmonary findings. Possible small pulmonary
nodule in the right upper lobe. Multiple pulmonary nodules noted on
recent PET-CT from 09/16/2018.

There are nondisplaced right rib fractures involving the
anterolateral sixth, seventh, eighth and ninth ribs. There also
mildly displaced posterior rib fractures involving the fifth, sixth,
seventh, ninth and tenth ribs. No associated pneumothorax or pleural
effusion.
IMPRESSION: 1. Multiple right-sided rib fractures as detailed above. There are
segmental fractures involving the sixth, seventh and ninth ribs.
2. No definite pleural hematoma or pneumothorax.

## 2021-01-18 IMAGING — CR DG CHEST 2V
2 series · 2 of 2 positions shown · non-contrast
Comparison: 12/26/2018 at 4892 hours

CLINICAL DATA: RIGHT pneumothorax, multiple rib fractures, breast
cancer, diabetes mellitus

EXAM:
CHEST - 2 VIEW

[chest lat]
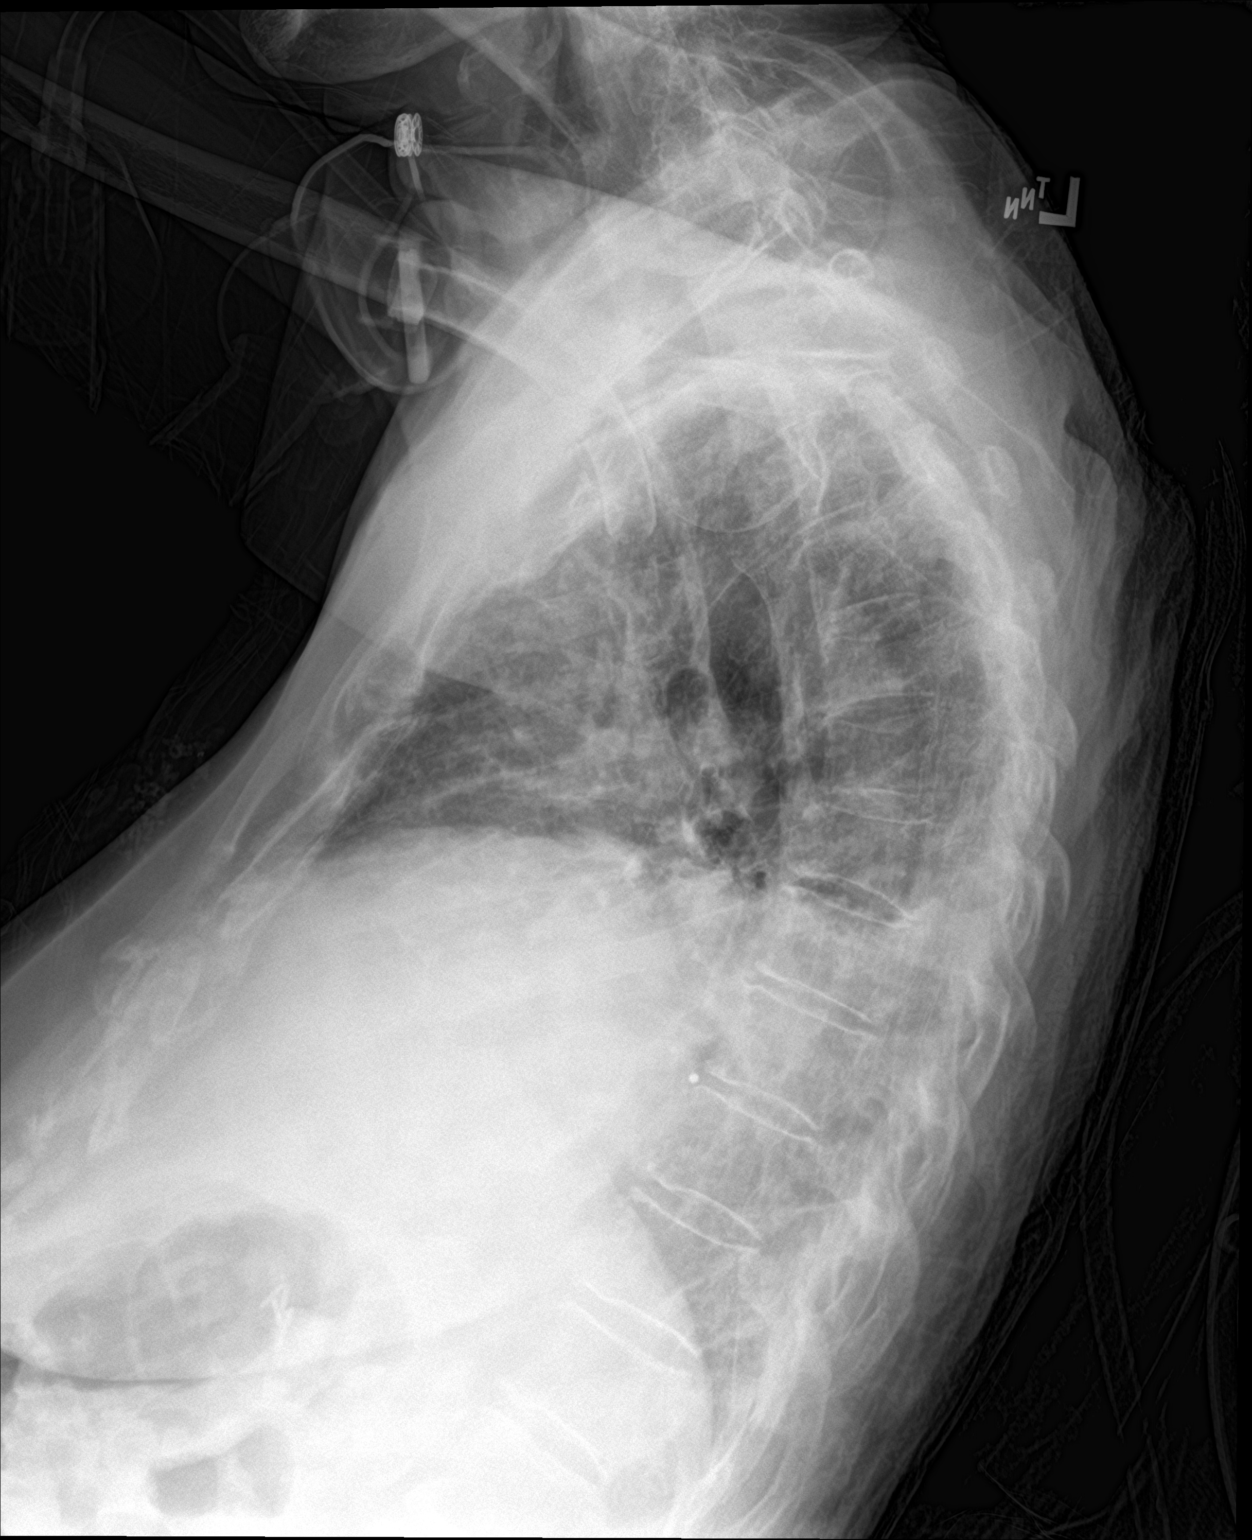

[chest ap]
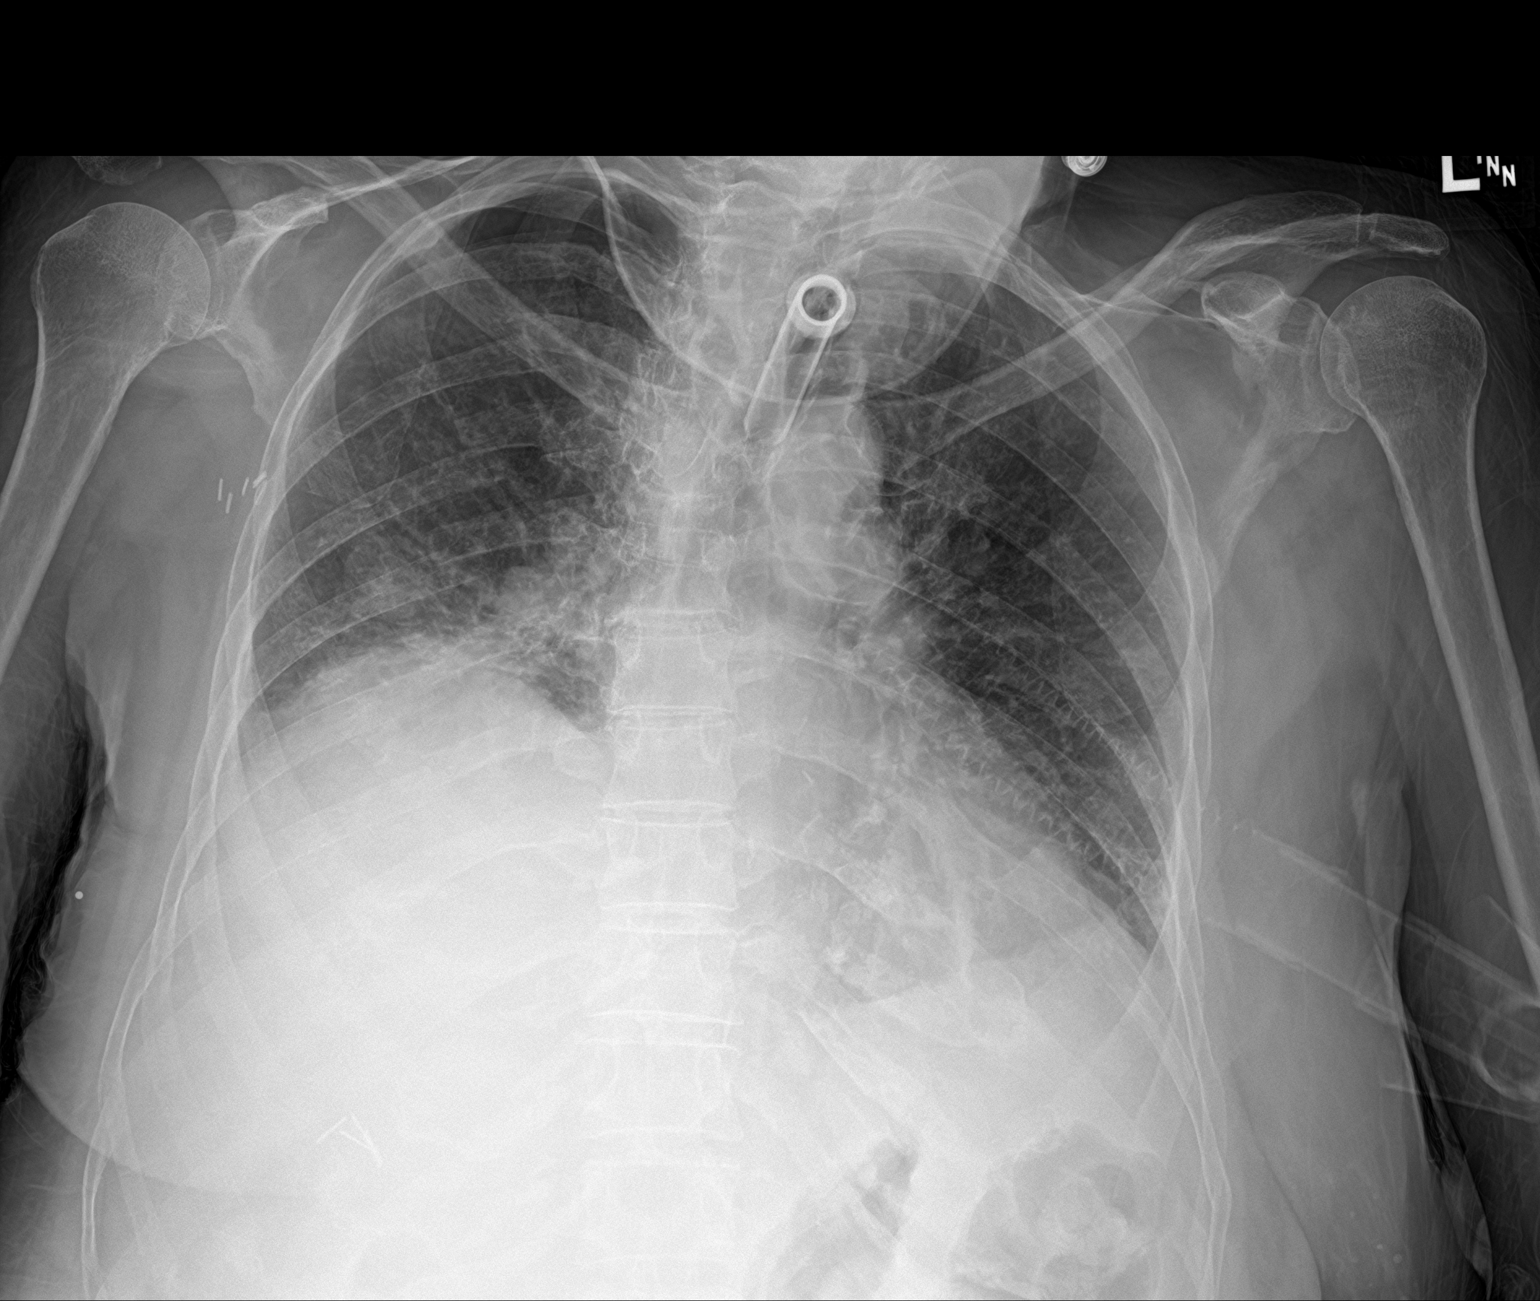

[2 of 2 positions shown; findings below may reference images not displayed]

FINDINGS: Tracheostomy tube stable.

Normal heart size and mediastinal contours.

Persistent elevation of RIGHT diaphragm with bibasilar atelectasis
greater on RIGHT.

EKG lead projects over the lower RIGHT lung.

Persistent small RIGHT apex pneumothorax, minimally decreased.

Central peribronchial thickening again seen.

Bones demineralized.

Displaced fractures of the posterior RIGHT fifth sixth seventh and
eighth ribs noted.
IMPRESSION: Small RIGHT apex pneumothorax minimally decreased.

Bronchitic changes with bibasilar atelectasis greater on RIGHT.

## 2021-01-18 IMAGING — CR DG CHEST 2V
2 series · 2 of 2 positions shown · non-contrast
Comparison: His chest CT from yesterday

CLINICAL DATA: Multiple rib fractures

EXAM:
CHEST - 2 VIEW

[chest lat]
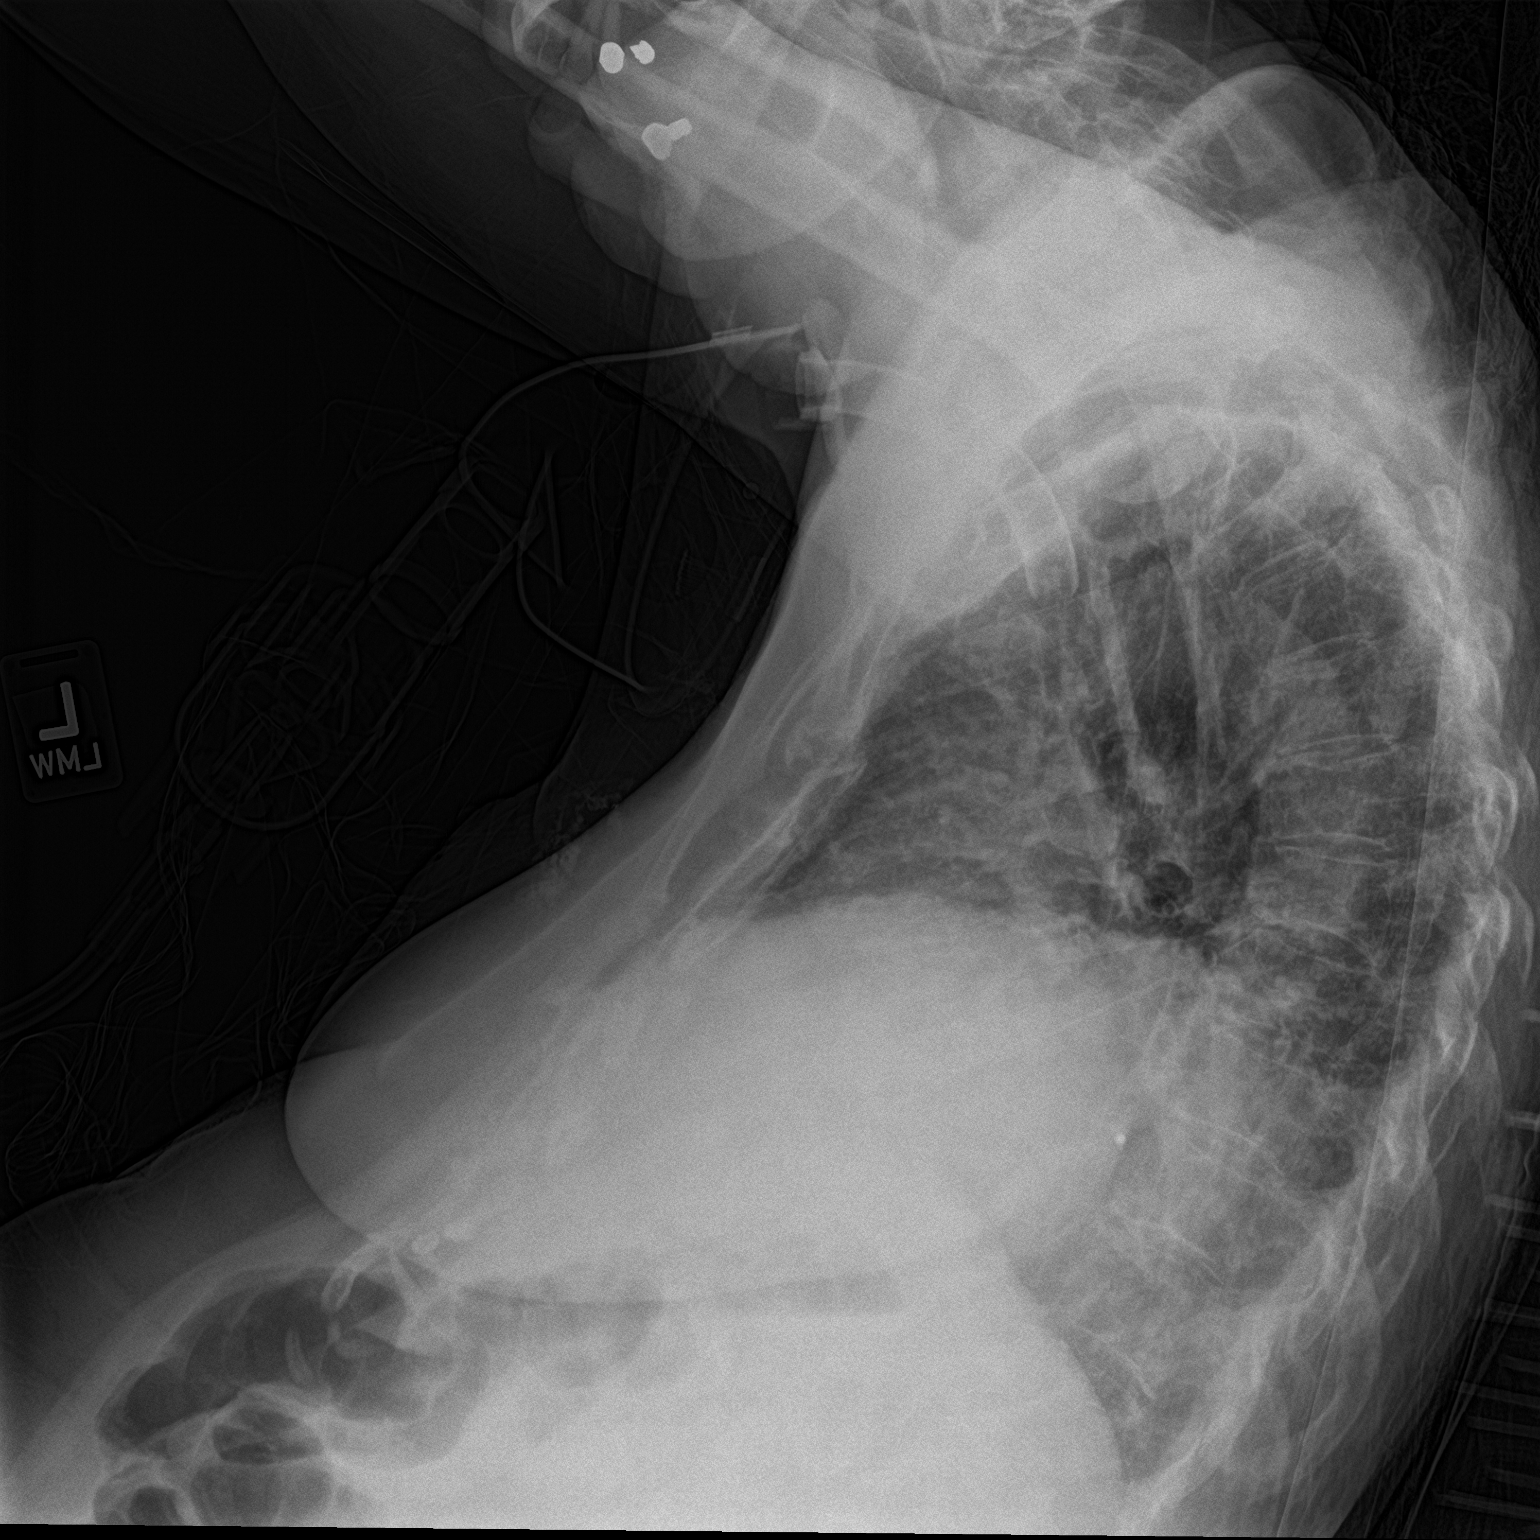

[chest ap]
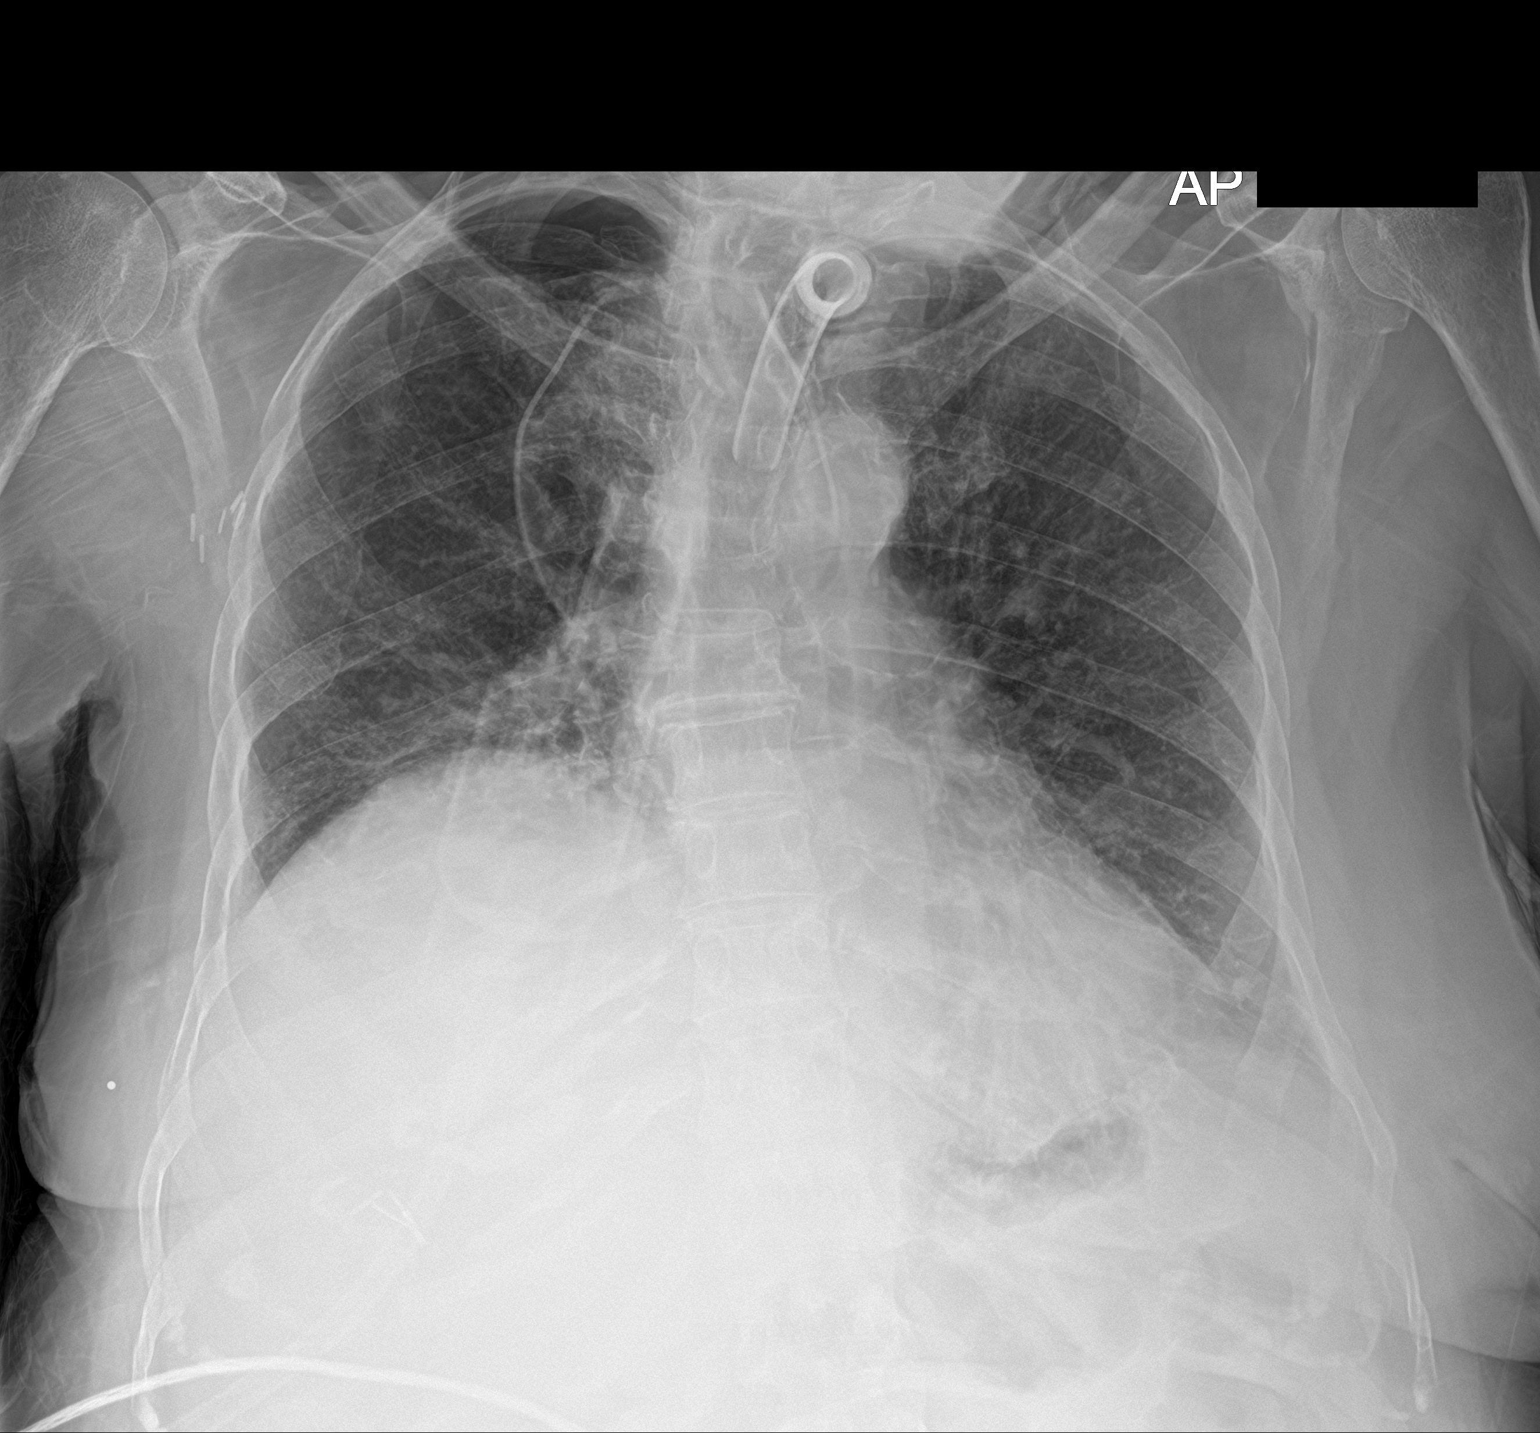

[2 of 2 positions shown; findings below may reference images not displayed]

FINDINGS: Increase in right pneumothorax, although still small volume at 2
posterior rib interspaces. There is presumably an air leak. There is
chronic elevation of the right diaphragm. Atelectatic type opacity
at both lung bases. Known right rib fractures. Normal heart size.
Tracheostomy tube in place. These results were called by telephone
at the time of interpretation on 12/26/2018 at [DATE] to provider
Kayy, who verbally acknowledged these results.
IMPRESSION: Enlarged but still small right pneumothorax at the apex.

## 2021-01-19 IMAGING — CR DG CHEST 2V
1 series · 2 of 2 positions shown · non-contrast
Comparison: Chest x-ray dated 12/26/2018.

CLINICAL DATA: No pneumothorax on the RIGHT after fall with rib
fractures.

EXAM:
CHEST - 2 VIEW

[Series 1: dg chest 2 view · 0.14mm/px · 2 of 2 slices shown]
[im 1/2]
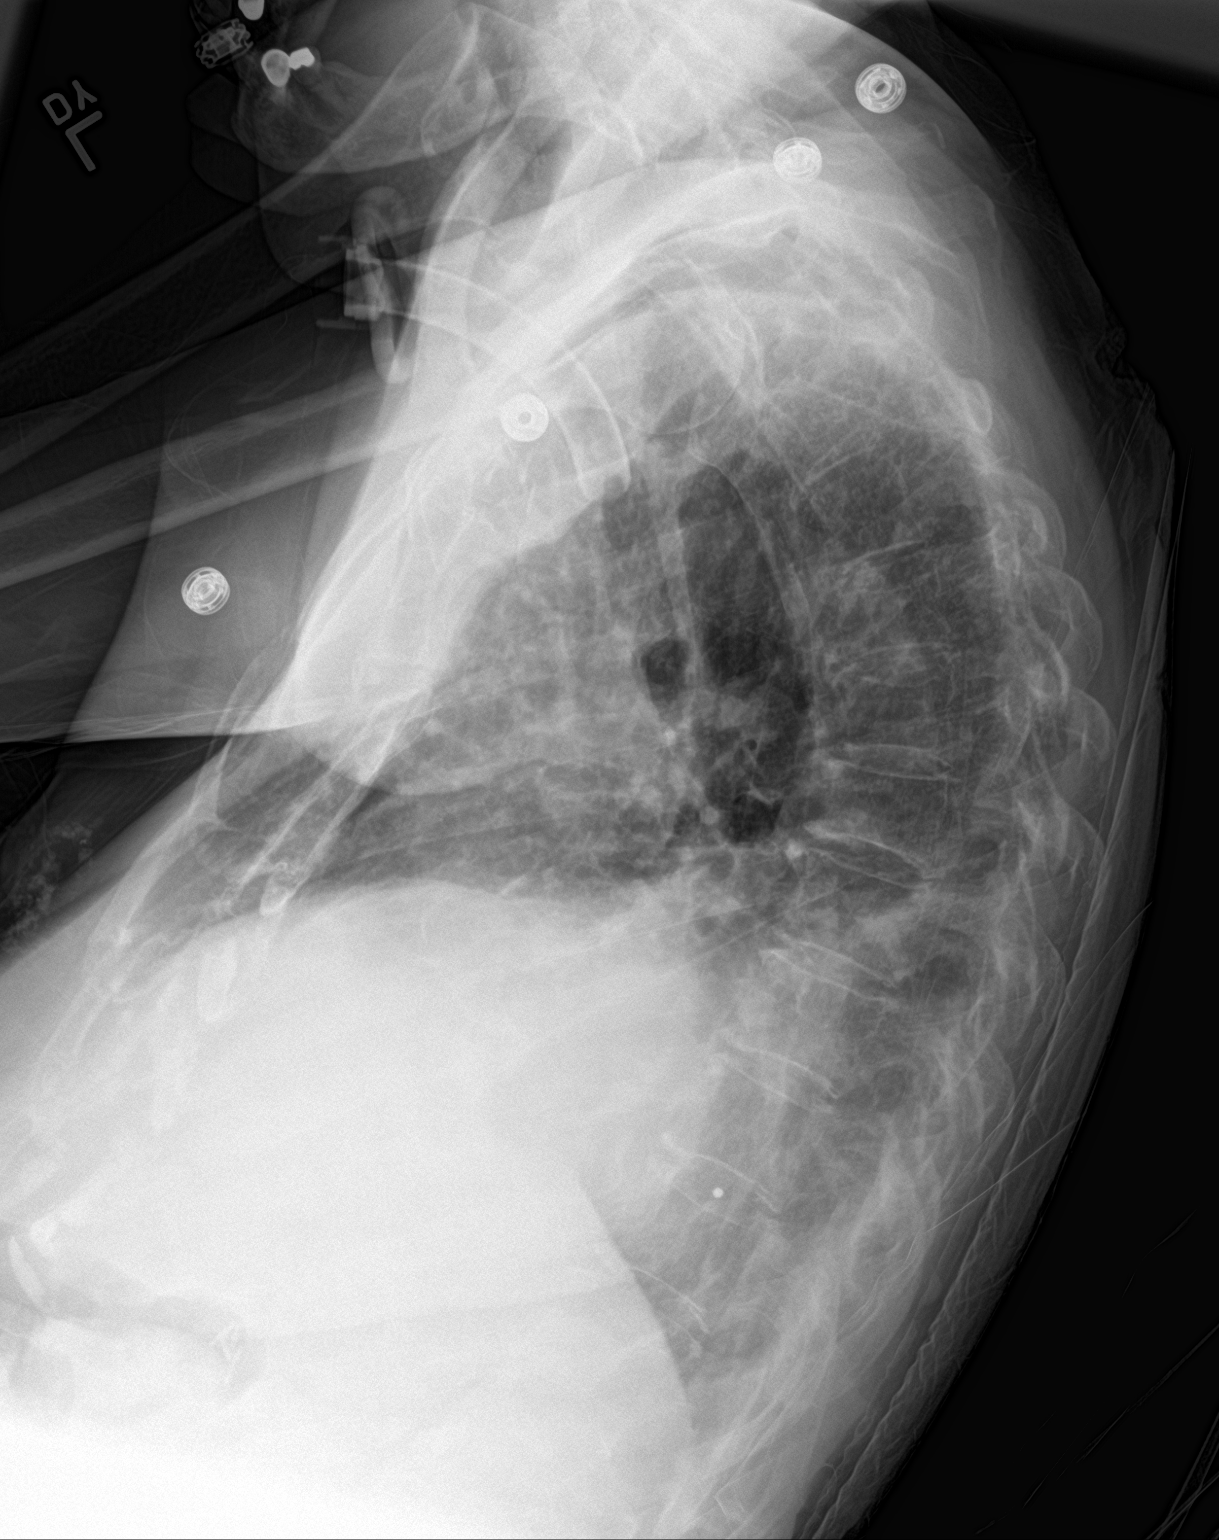
[im 2/2]
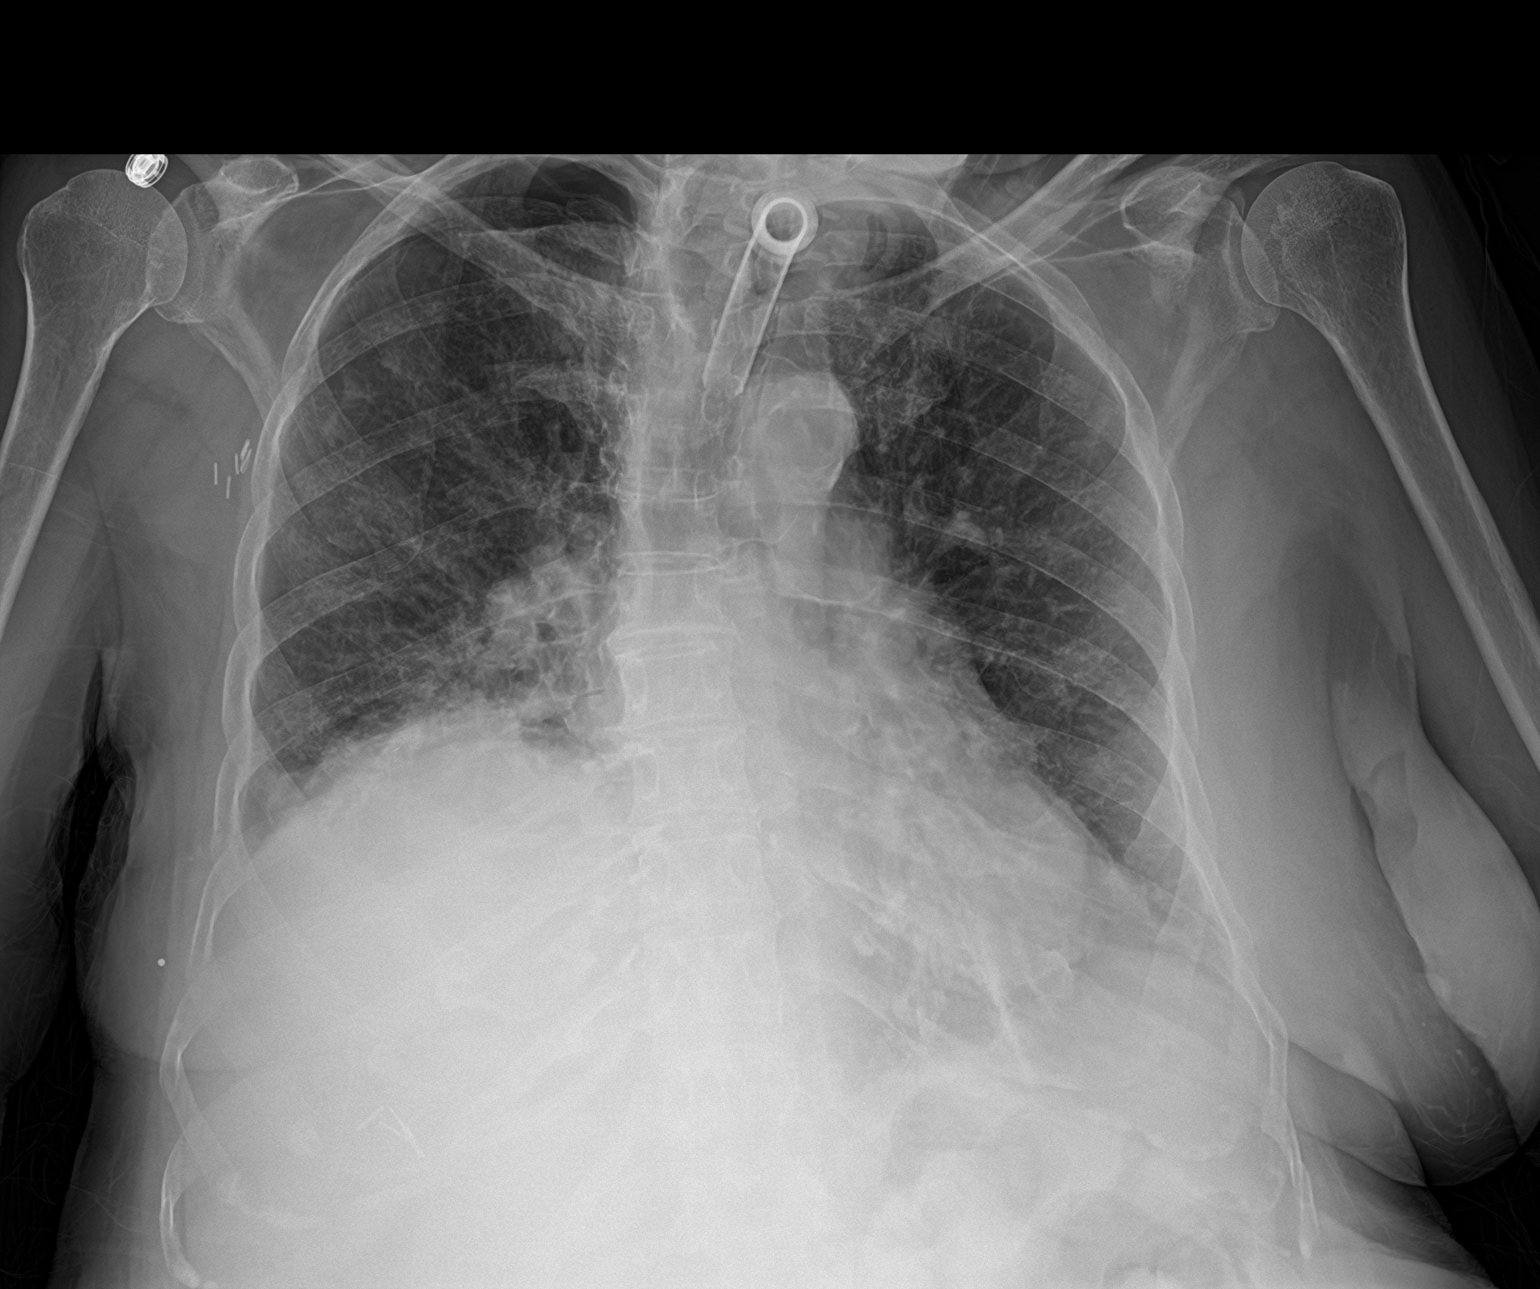

[2 of 2 positions shown; findings below may reference images not displayed]

FINDINGS: Stable small RIGHT apical pneumothorax. Stable opacity at the RIGHT
lung base, presumed atelectasis. Stable elevation of the RIGHT
hemidiaphragm. Heart size and mediastinal contours are stable.
Displaced rib fractures again noted on the RIGHT.
IMPRESSION: 1. Stable small RIGHT apical pneumothorax.
2. Stable opacity at the RIGHT lung base, presumed atelectasis.
3. Displaced rib fractures on the RIGHT.

## 2021-01-20 IMAGING — DX DG CHEST 1V
1 series · 1 of 1 positions shown · non-contrast
Comparison: Chest x-rays dated 12/27/2018 and 12/26/2018.

CLINICAL DATA: Pneumothorax

EXAM:
CHEST  1 VIEW

[chest ap]
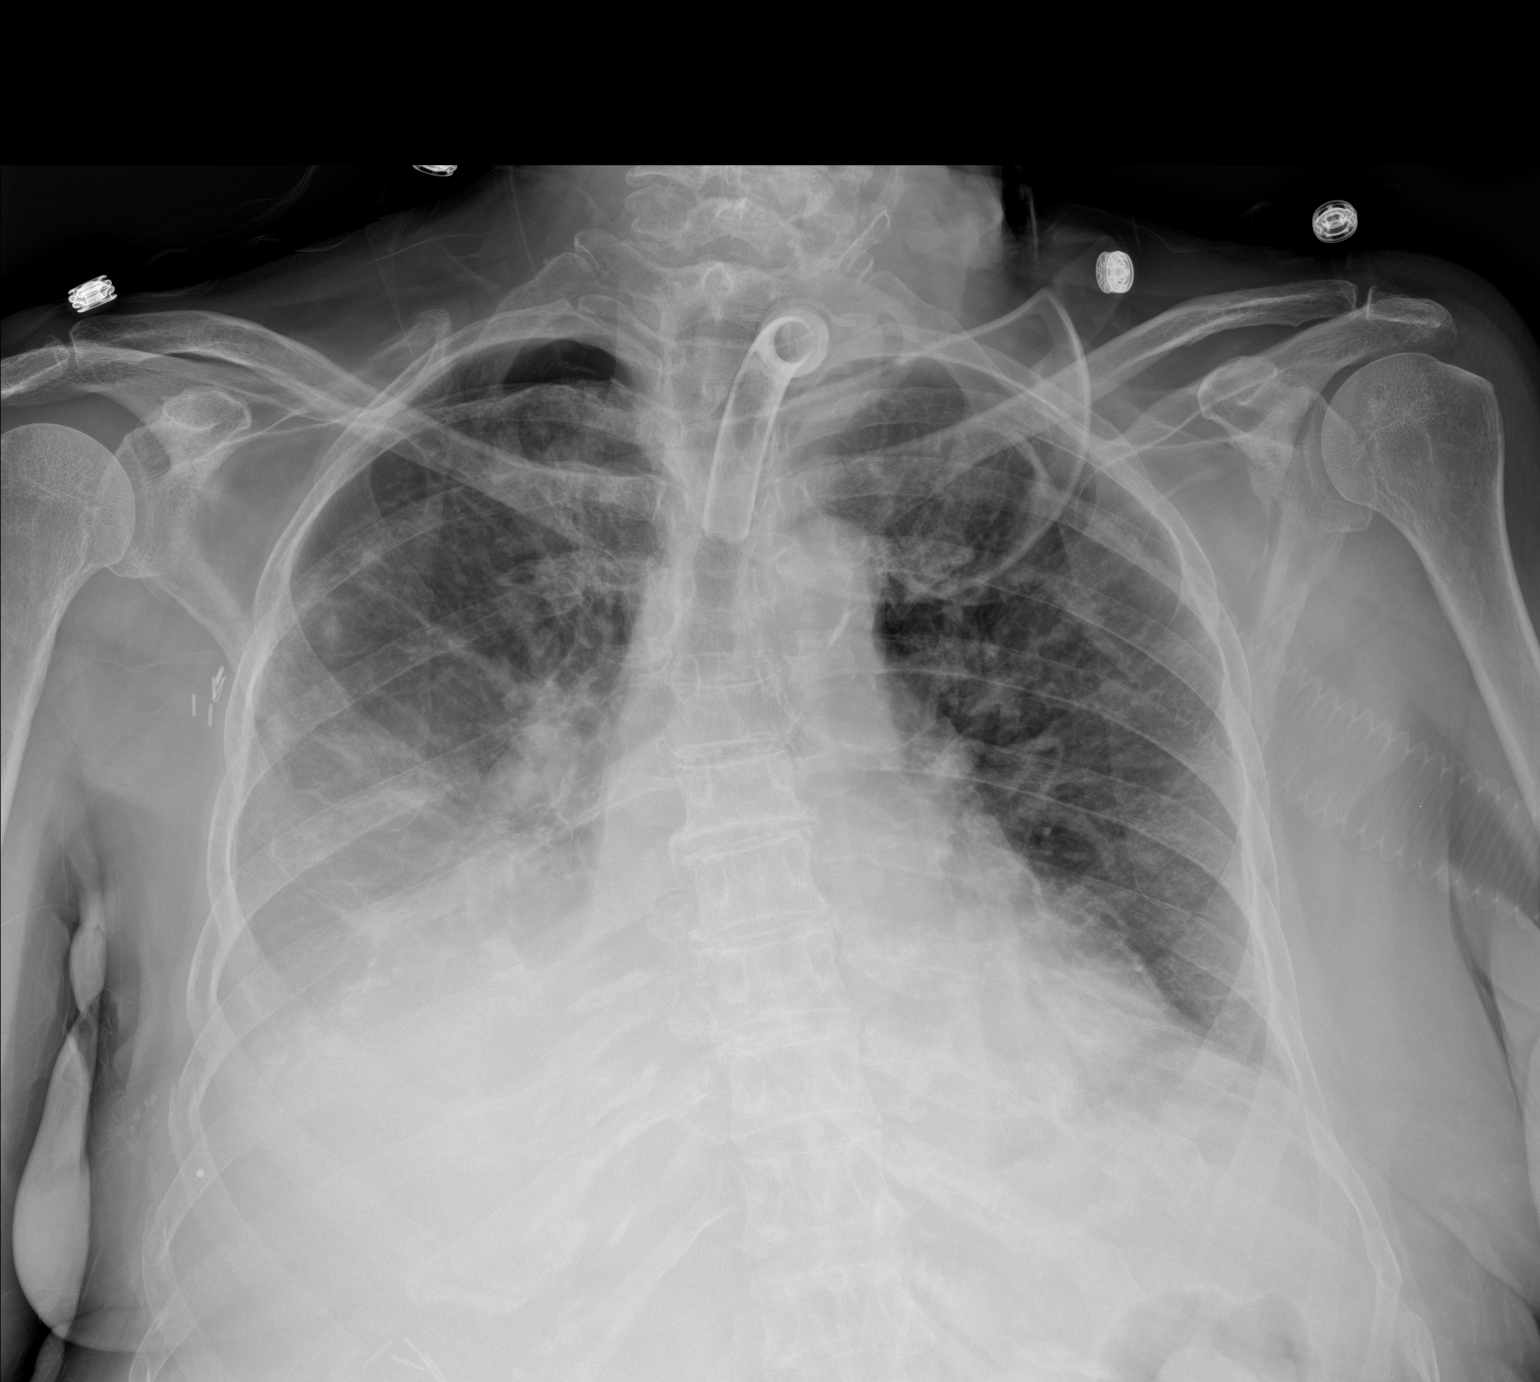

[1 of 1 positions shown; findings below may reference images not displayed]

FINDINGS: Stable small RIGHT apical pneumothorax. Increased opacity at the
RIGHT lung base, likely atelectasis and/or small pleural effusion.
Heart size and mediastinal contours are stable. Tracheostomy tube
appears appropriately positioned in the midline.
IMPRESSION: 1. Stable small RIGHT apical pneumothorax.
2. Increased opacity at the RIGHT lung base, likely atelectasis
and/or small pleural effusion.

## 2021-01-25 ENCOUNTER — Inpatient Hospital Stay: Admission: RE | Admit: 2021-01-25 | Payer: Medicare Other | Source: Ambulatory Visit

## 2021-01-26 ENCOUNTER — Emergency Department: Payer: Medicare Other

## 2021-01-26 ENCOUNTER — Emergency Department
Admission: EM | Admit: 2021-01-26 | Discharge: 2021-01-26 | Disposition: A | Discharge status: Home / Self Care | Payer: Medicare Other | Attending: Student in an Organized Health Care Education/Training Program | Admitting: Student in an Organized Health Care Education/Training Program

## 2021-01-26 ENCOUNTER — Encounter: Payer: Self-pay | Admitting: Emergency Medicine

## 2021-01-26 ENCOUNTER — Other Ambulatory Visit: Payer: Self-pay

## 2021-01-26 DIAGNOSIS — Z7984 Long term (current) use of oral hypoglycemic drugs: Secondary | ICD-10-CM | POA: Diagnosis not present

## 2021-01-26 DIAGNOSIS — N3000 Acute cystitis without hematuria: Secondary | ICD-10-CM | POA: Insufficient documentation

## 2021-01-26 DIAGNOSIS — I110 Hypertensive heart disease with heart failure: Secondary | ICD-10-CM | POA: Insufficient documentation

## 2021-01-26 DIAGNOSIS — E1142 Type 2 diabetes mellitus with diabetic polyneuropathy: Secondary | ICD-10-CM | POA: Insufficient documentation

## 2021-01-26 DIAGNOSIS — K9423 Gastrostomy malfunction: Secondary | ICD-10-CM | POA: Diagnosis present

## 2021-01-26 DIAGNOSIS — Z79899 Other long term (current) drug therapy: Secondary | ICD-10-CM | POA: Insufficient documentation

## 2021-01-26 DIAGNOSIS — Z853 Personal history of malignant neoplasm of breast: Secondary | ICD-10-CM | POA: Insufficient documentation

## 2021-01-26 DIAGNOSIS — I5032 Chronic diastolic (congestive) heart failure: Secondary | ICD-10-CM | POA: Diagnosis not present

## 2021-01-26 DIAGNOSIS — Z09 Encounter for follow-up examination after completed treatment for conditions other than malignant neoplasm: Secondary | ICD-10-CM

## 2021-01-26 LAB — URINALYSIS, ROUTINE W REFLEX MICROSCOPIC
Bilirubin Urine: NEGATIVE
Glucose, UA: 100 mg/dL — AB
Hgb urine dipstick: NEGATIVE
Ketones, ur: NEGATIVE mg/dL
Leukocytes,Ua: NEGATIVE
Nitrite: POSITIVE — AB
Protein, ur: NEGATIVE mg/dL
Specific Gravity, Urine: 1.02 (ref 1.005–1.030)
pH: 7.5 (ref 5.0–8.0)

## 2021-01-26 MED ORDER — DIATRIZOATE MEGLUMINE & SODIUM 66-10 % PO SOLN
30.0000 mL | Freq: Once | ORAL | Status: AC
  Administered 2021-01-26: 30 mL

## 2021-01-26 MED ORDER — CIPROFLOXACIN HCL 250 MG PO TABS: 250.0000 mg | ORAL_TABLET | Freq: Two times a day (BID) | ORAL | 0 refills | Status: AC

## 2021-01-26 NOTE — ED Provider Notes (Signed)
[facility] Emergency Department Provider Note ____________________________________________   Event Date/Time   First MD Initiated Contact with Patient 01/26/21 1414     (approximate)  I have reviewed the triage vital signs and the nursing notes.   HISTORY  Chief Complaint Feeding Tube Problem  HPI Angela David is a 80 y.o. female with history of diabetes, breast CA, tracheostomy, PEG tube and remaining history as listed below presents to the emergency department for replacement of PEG tube.  She states that she coughed and tube dislodged with the balloon intact.  She is also having dysuria that started a few days ago and is taking Azo without relief..         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[place]) 08/28/2016   GERD (gastroesophageal reflux disease)    Glaucoma    High cholesterol    History of colon polyps    Osteoarthritis    Personal history of chemotherapy 2002   BREAST CA   Personal history of radiation therapy 2002   BREAST CA   S/P percutaneous endoscopic gastrostomy (PEG) tube placement (H[facility])    T2DM (type 2 diabetes mellitus) ([place])    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   Ventral hernia    Vocal cord paralysis 2012    Patient Active Problem List   Diagnosis Date Noted   Chronic respiratory failure with hypoxia ([place]) 03/29/2020   Chronic diastolic CHF (congestive heart failure) ([place]) 01/31/2020   Uncontrolled type 2 diabetes mellitus with hyperglycemia ([place]) 01/31/2020   AF (paroxysmal atrial fibrillation) ([place]) 01/31/2020   Aspiration pneumonia of both lower lobes due to gastric secretions ([place]) 01/41/0301   Acute metabolic encephalopathy 31/43/8887   Acute respiratory failure ([place]) 01/23/2020   Left lower lobe pneumonia  01/16/2020   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   Drug-induced polyneuropathy ([place]) 05/05/2019   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   Weakness    Hypoxia    Acute respiratory failure with hypoxia and hypercapnia ([place]) 03/21/2019   Hypotension 03/21/2019   Acute encephalopathy 03/21/2019   Multiple fractures of ribs, right side, initial encounter for closed fracture 12/25/2018   Colon polyp 11/06/2018   Esophageal dysphagia    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    Stricture and stenosis of esophagus    Elevated CEA 09/02/2018   Goals of care, counseling/discussion 09/02/2018   Polyp of transverse colon 09/02/2018   Foreign body in right foot 05/26/2018   History of tracheostomy 04/30/2018   Moderate episode of recurrent major depressive disorder ([place]) 04/03/2018   Poor balance 04/03/2018   Shoulder lesion, right 02/28/2017   Cervical radiculopathy 09/28/2016   Peripheral neuropathy 08/28/2016   Diabetic peripheral neuropathy ([place]) 08/28/2016   Leg weakness, bilateral 08/13/2016   Gait abnormality 08/13/2016   Carcinoma of overlapping sites of right breast in female, estrogen receptor negative ([place]) 12/27/2015   Malignant neoplasm of breast ([place]) 03/28/2015   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   Hypercholesterolemia 03/28/2015   Recurrent major depressive disorder, in partial remission ([place]) 03/01/2015   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   Cephalalgia 09/15/2014   Headache due to trauma 09/15/2014   Osteopenia 01/07/2014   Age-related osteoporosis without current pathological fracture 01/07/2014   History of colonic polyps 12/14/2013   History  of breast cancer 11/26/2012   H/O malignant neoplasm of breast 11/26/2012   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   Tracheostomy present ([place]) 12/06/2011   Calcification of bronchus or trachea 01/16/2011   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   Anxiety state 12/19/2010   Blood in  sputum 12/19/2010   HLD (hyperlipidemia) 12/19/2010   Type 2 diabetes mellitus ([place]) 12/19/2010    Past Surgical History:  Procedure Laterality Date   ABDOMINAL HYSTERECTOMY     BREAST BIOPSY Right 2008   neg   BREAST BIOPSY Right 06/30/2020   "Q" clip-path pending   BREAST EXCISIONAL BIOPSY Right 2002   positive   BREAST LUMPECTOMY Right 2002   chemo and radiation   BREAST LUMPECTOMY WITH RADIOFREQUENCY TAG IDENTIFICATION Right 10/18/2020   Procedure: BREAST LUMPECTOMY WITH RADIOFREQUENCY TAG IDENTIFICATION;  Surgeon: Jules Husbands, MD;  Location: [facility] ORS;  Service: General;  Laterality: Right;   BREAST SURGERY Right 2002   LUMPECTOMY, radiation, chemotherapy   CATARACT EXTRACTION, BILATERAL     CHOLECYSTECTOMY N/A 12/10/2017   Procedure: LAPAROSCOPIC CHOLECYSTECTOMY;  Surgeon: Benjamine Sprague, DO;  Location: [facility] ORS;  Service: General;  Laterality: N/A;   COLON RESECTION Right 11/06/2018   Procedure: HAND ASSISTED LAPAROSCOPIC COLON RESECTION, RIGHT;  Surgeon: Jules Husbands, MD;  Location: [facility] ORS;  Service: General;  Laterality: Right;   COLONOSCOPY  2009,12/30/2013   COLONOSCOPY WITH PROPOFOL N/A 04/16/2018   Tubulovillous polyp proximal transverse colon, large sessile polyp, proximal transverse colon;  Surgeon: Robert Bellow, MD;  Location: [facility] END[facility];  Service: Endoscopy;  Laterality: N/A;   ESOPHAGOGASTRODUODENOSCOPY (EGD) WITH PROPOFOL N/A 10/14/2018   Procedure: ESOPHAGOGASTRODUODENOSCOPY (EGD) WITH PROPOFOL;  Surgeon: Lucilla Lame, MD;  Location: [facility] END[facility];  Service: Endoscopy;  Laterality: N/A;   TRACHEAL SURGERY  2013   TRACHEOSTOMY N/A    Dr Kathyrn Sheriff   XI ROBOTIC ASSISTED VENTRAL HERNIA N/A 10/18/2020   Procedure: XI ROBOTIC ASSISTED VENTRAL HERNIA;  Surgeon: Jules Husbands, MD;  Location: [facility] ORS;  Service: General;  Laterality: N/A;    Prior to Admission medications   Medication Sig Start Date End Date Taking? Authorizing Provider  acetaminophen  (TYLENOL) 500 MG tablet Take 1,000 mg by mouth in the morning and at bedtime.    [provider]  busPIRone (BUSPAR) 15 MG tablet Place 15 mg into feeding tube 2 (two) times daily. 09/26/20   [provider]  carboxymethylcellulose (REFRESH PLUS) 0.5 % SOLN Place 1 drop into both eyes 3 (three) times daily as needed (dry eyes).    [provider]  denosumab (PROLIA) 60 MG/ML SOSY injection Inject 60 mg into the skin every 6 (six) months.    [provider]  escitalopram (LEXAPRO) 10 MG tablet Place 3 tablets (30 mg total) into feeding tube daily. Patient taking differently: Place 10 mg into feeding tube daily. 02/13/20   Flora Lipps, MD  escitalopram (LEXAPRO) 20 MG tablet Place 20 mg into feeding tube daily.    [provider]  fluconazole (DIFLUCAN) 150 MG tablet Place 150 mg into feeding tube once a week. 10/07/20   [provider]  folic acid (FOLVITE) 1 MG tablet Place 1 tablet (1 mg total) into feeding tube daily. 02/13/20   Flora Lipps, MD  gabapentin (NEURONTIN) 600 MG tablet Place 600 mg into feeding tube 3 (three) times daily.    [provider]  HYDROcodone-acetaminophen (NORCO/VICODIN) 5-325 MG tablet Take 1-2 tablets by mouth every 6 (six) hours as needed for moderate pain. 10/18/20  Pabon, Diego F, MD  lovastatin (MEVACOR) 20 MG tablet Place 20 mg into feeding tube daily. 06/13/20   [provider]  melatonin 5 MG TABS Place 1 tablet (5 mg total) into feeding tube at bedtime. 02/13/20   Flora Lipps, MD  metFORMIN (GLUCOPHAGE) 1000 MG tablet Place 1,000 mg into feeding tube daily with breakfast. 06/10/20   [provider]  metoprolol tartrate (LOPRESSOR) 25 MG tablet Place 25 mg into feeding tube daily. 06/13/20   [provider]  Multiple Vitamins-Minerals (HAIR/SKIN/NAILS) TABS Take 1 tablet by mouth daily.    [provider]  pantoprazole (PROTONIX) 40 MG tablet Take 40 mg by mouth daily.     [provider]  rOPINIRole (REQUIP) 0.5 MG tablet Place 1 tablet (0.5 mg total) into feeding tube at bedtime. 02/13/20   Flora Lipps, MD    Allergies Shellfish allergy, Sodium sulfite, Sulfa antibiotics, and Codeine  Family History  Problem Relation Age of Onset   Cancer Father        bone cancer   Cancer Sister        lymphoma   Cancer Brother    Breast cancer Neg Hx     Social History Social History   Tobacco Use   Smoking status: Never   Smokeless tobacco: Never  Vaping Use   Vaping Use: Never used  Substance Use Topics   Alcohol use: No   Drug use: No    Review of Systems  Constitutional: No fever/chills Eyes: No visual changes. ENT: No sore throat. Cardiovascular: Denies chest pain. Respiratory: Denies shortness of breath. Gastrointestinal: No abdominal pain.  No nausea, no vomiting.  No diarrhea.  No constipation. Genitourinary: Positive for dysuria. Musculoskeletal: Negative for back pain. Skin: Negative for rash. Neurological: Negative for headaches, focal weakness or numbness  ____________________________________________   PHYSICAL EXAM:  VITAL SIGNS: ED Triage Vitals  Enc Vitals Group     BP 01/26/21 1310 (!) 112/49     Pulse Rate 01/26/21 1310 78     Resp 01/26/21 1310 17     Temp 01/26/21 1310 98.4 F (36.9 C)     Temp Source 01/26/21 1310 Oral     SpO2 01/26/21 1310 94 %     Weight 01/26/21 1311 162 lb 4.1 oz (73.6 kg)     Height 01/26/21 1311 5\' 4"  (1.626 m)     Head Circumference --      Peak Flow --      Pain Score 01/26/21 1311 8     Pain Loc --      Pain Edu? --      Excl. in [place]? --     Constitutional: Alert and oriented. Well appearing and in no acute distress. Eyes: Conjunctivae are normal.  Head: Atraumatic. Nose: No congestion/rhinnorhea. Mouth/Throat: Mucous membranes are moist. Oropharynx non-erythematous. Neck: No stridor.   Hematological/Lymphatic/Immunilogical: No cervical  lymphadenopathy. Cardiovascular: Normal rate, regular rhythm. Grossly normal heart sounds.  Good peripheral circulation. Respiratory: Normal respiratory effort.  No retractions. Lungs CTAB. Gastrointestinal: Soft and nontender. No distention. No abdominal bruits. No CVA tenderness. PEG site without abnormality. Genitourinary:  Musculoskeletal: No lower extremity tenderness nor edema.  No joint effusions. Neurologic:  Normal speech and language. No gross focal neurologic deficits are appreciated. No gait instability. Skin:  Skin is warm, dry and intact. No rash noted. Psychiatric: Mood and affect are normal. Speech and behavior are normal.  ____________________________________________   LABS (all labs ordered are listed, but only abnormal results are  displayed)  Labs Reviewed - No data to display ____________________________________________  EKG  ____________________________________________  RADIOLOGY  ED MD interpretation:    Pending  Official radiology report(s): No results found.  ____________________________________________   PROCEDURES  Procedure(s) performed (including Critical Care):  Procedures  ____________________________________________   INITIAL IMPRESSION / ASSESSMENT AND PLAN   80 year old female presenting to the emergency department for treatment and evaluation of dysuria and PEG tube replacement.  See HPI for further details.   ED COURSE  PEG tube replaced with 36 [place] with a 20 cc balloon.  Confirmation imaging ordered and is pending.  Urinalysis also pending.  Care transitioned to [facility], PA-C who will follow up on results.     As part of my medical decision making, I reviewed the following data within the electronic MEDICAL RECORD NUMBER Notes from prior ED visits  ___________________________________________   FINAL CLINICAL IMPRESSION(S) / ED DIAGNOSES  Final diagnoses:  None   Acute cystitis PEG tube dysfunction and  replacement.  ED Discharge Orders     None        Sheela Mcculley was evaluated in Emergency Department on 01/26/2021 for the symptoms described in the history of present illness. She was evaluated in the context of the global COVID-19 pandemic, which necessitated consideration that the patient might be at risk for infection with the SARS-CoV-2 virus that causes COVID-19. Institutional protocols and algorithms that pertain to the evaluation of patients at risk for COVID-19 are in a state of rapid change based on information released by regulatory bodies including the CDC and federal and state organizations. These policies and algorithms were followed during the patient's care in the ED.   Note:  This document was prepared using Dragon voice recognition software and may include unintentional dictation errors.    Victorino Dike, FNP 01/28/21 [id number]    Merlyn Lot, MD 02/01/21 [phone]

## 2021-01-26 NOTE — Discharge Instructions (Signed)
Take Cipro twice daily for seven days.

## 2021-01-26 NOTE — ED Provider Notes (Signed)
  Physical Exam  BP (!) 110/54 (BP Location: Right Arm)   Pulse 70   Temp 98.7 F (37.1 C) (Oral)   Resp 17   Ht 5\' 4"  (1.626 m)   Wt 73.6 kg   SpO2 95%   BMI 27.85 kg/m   Physical Exam  ED Course/Procedures     Procedures  MDM   Assumed patient care from Rollene Fare, NP.  G-tube will placed on x-ray.  Urinalysis was concerning for UTI.  Urine culture ordered.  Patient requested to be treated with Cipro stating that she tolerated this medication well and it has worked well for her in the past.  Prescribed Cipro twice daily for the next 7 days.  Return precautions were given to return with new or worsening symptoms.      Vallarie Mare [place], PA-C 01/26/21 1633    Duffy Bruce, MD 01/26/21 [phone]

## 2021-01-26 NOTE — ED Triage Notes (Signed)
Pt comes into the ED via POV c/o feeding tube problem.  PT states she coughed this morning and her feeding tube came out at 11:30 this morning.  PT in NAD at this time.

## 2021-01-27 LAB — URINE CULTURE: Culture: <10000 — AB

## 2021-01-30 IMAGING — CR DG CHEST 2V
1 series · 2 of 2 positions shown · non-contrast
Comparison: Chest radiograph dated 12/28/2018.

CLINICAL DATA: 78-year-old female with recent fall and right rib
fractures and right apical pneumothorax. Persistent pain on the
right side.

EXAM:
CHEST - 2 VIEW

[Series 1: dg chest 2 view · 0.14mm/px · 2 of 2 slices shown]
[im 1/2]
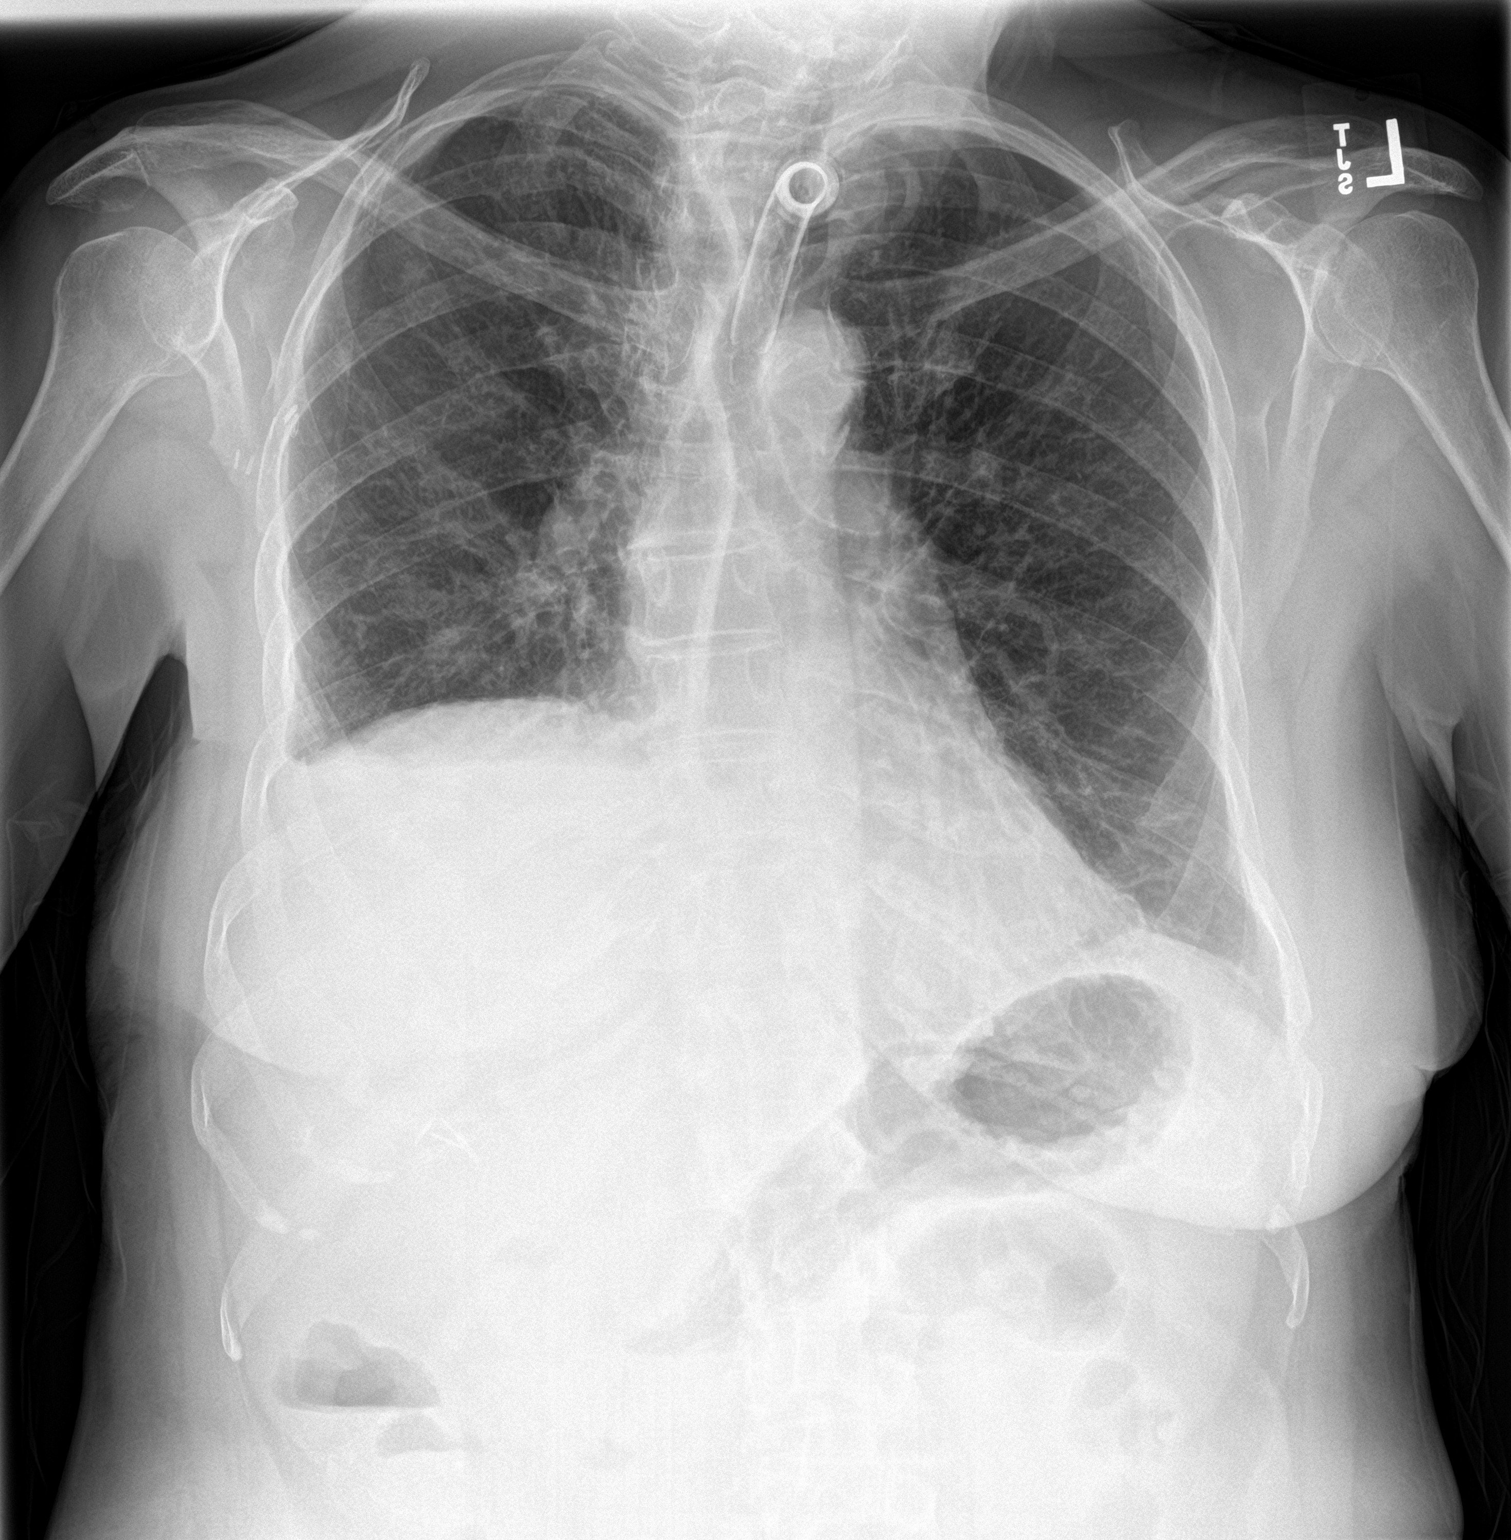
[im 2/2]
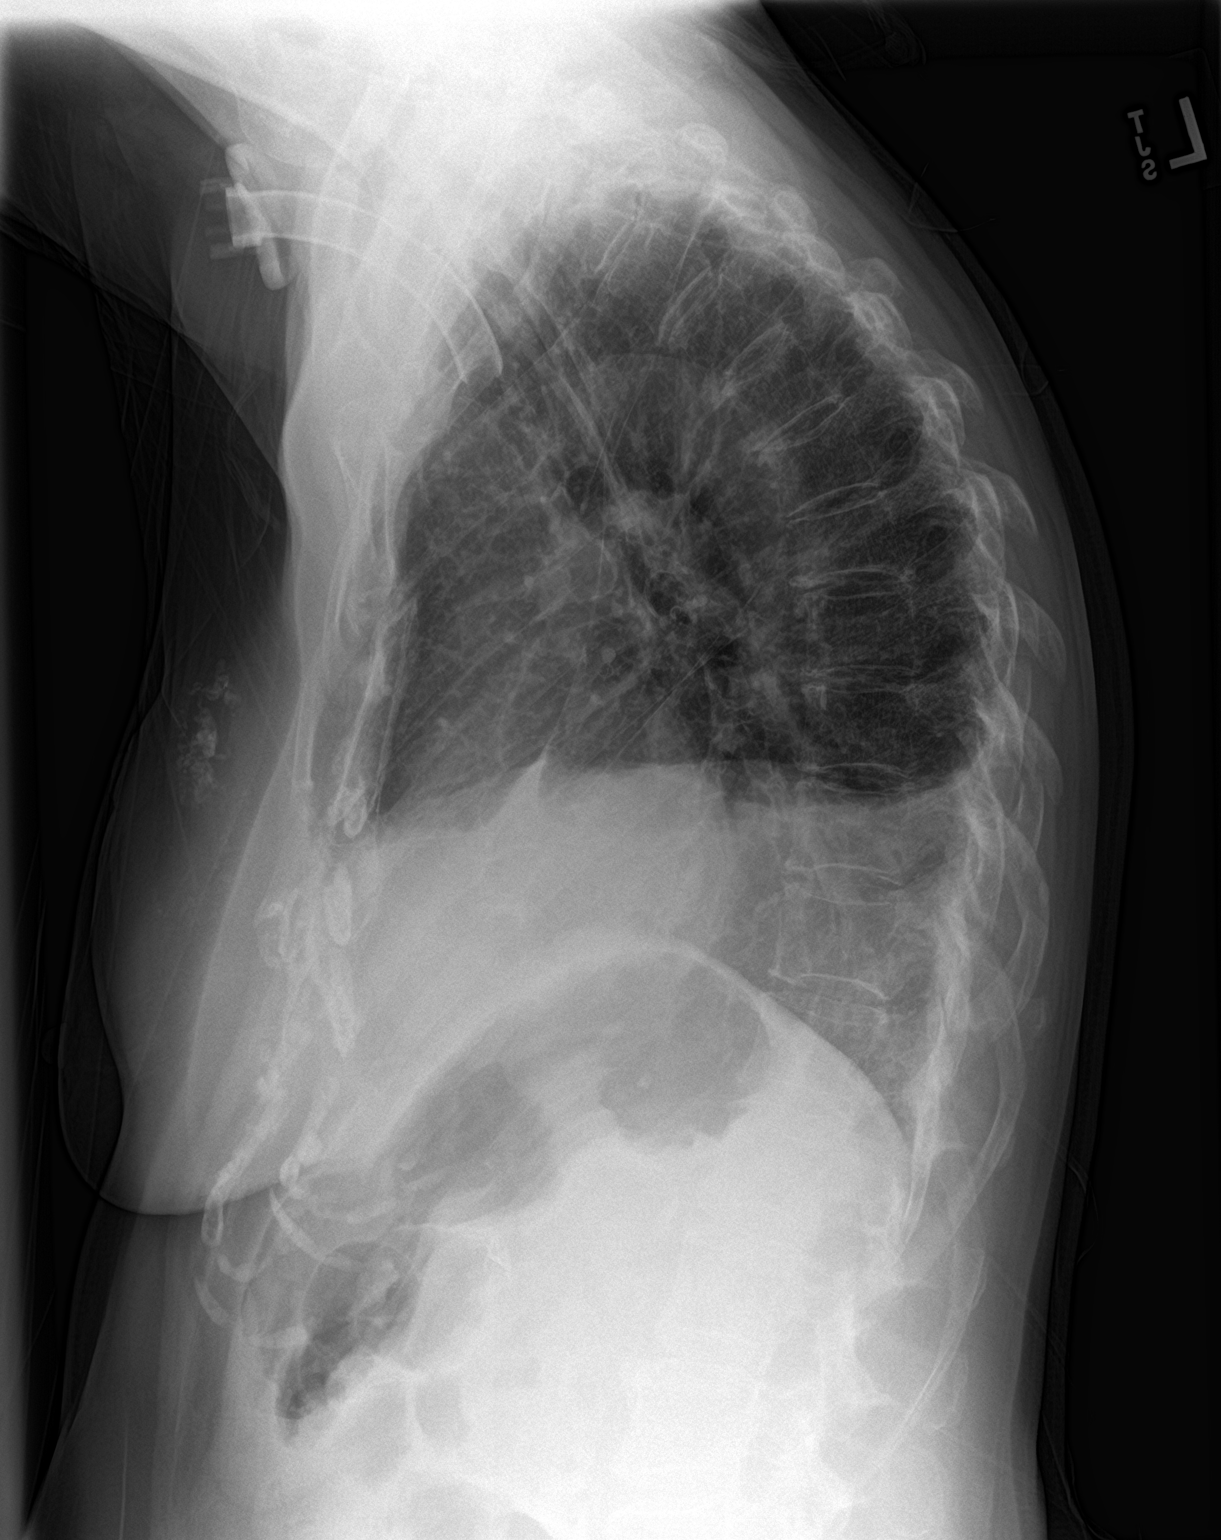

[2 of 2 positions shown; findings below may reference images not displayed]

FINDINGS: Tracheostomy in similar position.

Interval resolution the previously seen right apical pneumothorax.
There is mild eventration of the right hemidiaphragm with right lung
base atelectasis. The left lung is clear. There is a 5 mm right
upper lobe nodule better seen on the prior CT of 12/25/2018. Stable
cardiac silhouette. There is osteopenia with degenerative changes of
the spine. Evaluation of the ribs is limited on the provided images.
Dedicated rib series may provide better evaluation of the ribs.
IMPRESSION: 1. Interval resolution of the previously seen right apical
pneumothorax.
2. Right lung base atelectasis.
3. Limited evaluation of the ribs on this chest radiograph.
Dedicated rib series may provide better evaluation.

## 2021-02-01 ENCOUNTER — Other Ambulatory Visit: Payer: Self-pay

## 2021-02-01 DIAGNOSIS — Z1231 Encounter for screening mammogram for malignant neoplasm of breast: Secondary | ICD-10-CM

## 2021-02-02 ENCOUNTER — Ambulatory Visit
Admission: RE | Admit: 2021-02-02 | Discharge: 2021-02-02 | Disposition: A | Discharge status: Home / Self Care | Payer: Medicare Other | Source: Ambulatory Visit | Attending: Oncology | Admitting: Oncology

## 2021-02-02 ENCOUNTER — Other Ambulatory Visit: Payer: Self-pay

## 2021-02-02 DIAGNOSIS — C50011 Malignant neoplasm of nipple and areola, right female breast: Secondary | ICD-10-CM | POA: Diagnosis present

## 2021-02-02 DIAGNOSIS — M85852 Other specified disorders of bone density and structure, left thigh: Secondary | ICD-10-CM | POA: Insufficient documentation

## 2021-02-02 DIAGNOSIS — M85851 Other specified disorders of bone density and structure, right thigh: Secondary | ICD-10-CM | POA: Diagnosis present

## 2021-02-02 DIAGNOSIS — Z171 Estrogen receptor negative status [ER-]: Secondary | ICD-10-CM | POA: Diagnosis present

## 2021-02-06 ENCOUNTER — Ambulatory Visit: Payer: Medicare Other | Admitting: Surgery

## 2021-02-11 ENCOUNTER — Other Ambulatory Visit: Payer: Self-pay | Admitting: Oncology

## 2021-02-13 ENCOUNTER — Telehealth: Payer: Self-pay | Admitting: *Deleted

## 2021-02-13 ENCOUNTER — Other Ambulatory Visit: Payer: Self-pay

## 2021-02-13 ENCOUNTER — Telehealth: Payer: Self-pay

## 2021-02-13 DIAGNOSIS — M8580 Other specified disorders of bone density and structure, unspecified site: Secondary | ICD-10-CM

## 2021-02-13 DIAGNOSIS — C50011 Malignant neoplasm of nipple and areola, right female breast: Secondary | ICD-10-CM

## 2021-02-13 DIAGNOSIS — Z1231 Encounter for screening mammogram for malignant neoplasm of breast: Secondary | ICD-10-CM

## 2021-02-13 NOTE — Telephone Encounter (Signed)
Spoke to pt and she is agreeable to resuming Prolia.   Please schedule lab/Prolia this week or next, then Lab/MD/ prolia 6 months from this/next weeks prolia. Cancel appts in [place]. Please update pt of appts

## 2021-02-13 NOTE — Telephone Encounter (Signed)
Patient called asking to be called with BMD results  EXAM: DUAL X-RAY ABSORPTIOMETRY (DXA) FOR BONE MINERAL DENSITY   IMPRESSION: crr   Your patient Angela David completed a BMD test on 02/02/2021 using the [place] (analysis version: 14.10) manufactured by [employer]. The following summarizes the results of our evaluation. PATIENT BIOGRAPHICAL: Name: Angela David, Angela David Patient ID: [id number] Birth Date: June 17, 1940 Height: 64.0 in. Gender: Female Exam Date: 02/02/2021 Weight: 162.3 lbs. Indications: Advanced Age, Caucasian, diabetic, Family History of Fracture, feeding tube, Height Loss, History of Fracture (Adult), hx breast ca, hx chemo, hx radiation, Hysterectomy, neuropathy, osteopenia, POSTmenopausal Fractures: lt. knee Treatments: calcium /vit d, GABAPENTIN, metformin, multivitamin, prolia   ASSESSMENT: The BMD measured at Forearm Radius 33% is 0.587 g/cm2 with a T-score of -3.3. This patient is considered osteoporotic according to [facility]) criteria. Lumbar spine was not utilized due to advanced degenerative changes/scoliosis, etc.The scan quality is limited by exclusion of L-spine. Site Region Measured Measured WHO Young Adult BMD Date       Age      Classification T-score DualFemur Neck Left 4/02/[age] 78.1 Osteopenia -1.5 0.826 g/cm2 DualFemur Neck Left 04/09/2018 75.2 Osteopenia -1.3 0.854 g/cm2   DualFemur Total Mean 02/02/2021 78.1 Normal -0.6 0.928 g/cm2 DualFemur Total Mean 04/09/2018 75.2 Normal -0.8 0.907 g/cm2   Left Forearm Radius 33% 02/02/2023 78.1 Osteoporosis -3.3 0.587 g/cm2 Left Forearm Radius 33% 04/09/2018 75.2 Osteoporosis -3.4 0.578 g/cm2   World Health Organization [facility]) criteria for post-menopausal, Caucasian Women: Normal:       T-score at or above -1 SD Osteopenia:   T-score between -1 and -2.5 SD Osteoporosis: T-score at or below -2.5 SD RECOMMENDATIONS: 1. All patients should optimize  calcium and vitamin D intake. 2. Consider FDA-approved medical therapies in postmenopausal women and men aged 85 years and older, based on the following: a. A hip or vertebral (clinical or morphometric) fracture b. T-score < -2.5 at the femoral neck or spine after appropriate evaluation to exclude secondary causes c. Low bone mass (T-score between -1.0 and -2.5 at the femoral neck or spine) and a 10-year probability of a hip fracture > 3% or a 10-year probability of a major osteoporosis-related fracture > 20% based on the [place]-adapted WHO algorithm d. Clinician judgment and/or patient preferences may indicate treatment for people with 10-year fracture probabilities above or below these levels FOLLOW-UP: People with diagnosed cases of osteoporosis or at high risk for fracture should have regular bone mineral density tests. For patients eligible for Medicare, routine testing is allowed once every 2 years. The testing frequency can be increased to one year for patients who have rapidly progressing disease, those who are receiving or discontinuing medical therapy to restore bone mass, or have additional risk factors. I have reviewed this report, and agree with the above findings.   [facility] Radiology     Electronically Signed   By: Rolm Baptise M.D.   On: 02/03/2021 15:01    Earlie Server, MD  02/11/2021  4:24 PM EST     Let her know that bone density shows further decreased bone density, she has osteoporosis.  Ask if she would like to resume on Prolia every 6 months. If yes, please arrange her to do lab - BMP + prolia, and move her follow up to be 6 months from her prolia, lab md + prolia. Thanks.

## 2021-02-13 NOTE — Telephone Encounter (Signed)
Spoke to pt and went over plan.

## 2021-02-13 NOTE — Telephone Encounter (Signed)
-----   Message from Earlie Server, MD sent at 02/11/2021  4:24 PM EST ----- Let her know that bone density shows further decreased bone density, she has osteoporosis.  Ask if she would like to resume on Prolia every 6 months. If yes, please arrange her to do lab - BMP + prolia, and move her follow up to be 6 months from her prolia, lab md + prolia. Thanks.

## 2021-02-13 NOTE — Telephone Encounter (Signed)
Spoke to pt, she will call back tomorrow 02/14/22 to schedule appts

## 2021-02-15 ENCOUNTER — Telehealth: Payer: Self-pay | Admitting: Oncology

## 2021-02-15 NOTE — Telephone Encounter (Signed)
Pt called to set up an appt for a shot. Call back at [phone]

## 2021-02-20 ENCOUNTER — Emergency Department: Payer: Medicare Other

## 2021-02-20 ENCOUNTER — Other Ambulatory Visit: Payer: Self-pay

## 2021-02-20 ENCOUNTER — Inpatient Hospital Stay
Admission: EM | Admit: 2021-02-20 | Discharge: 2021-03-06 | DRG: 870 | Disposition: A | Discharge status: Other Acute Inpatient Hospital | Payer: Medicare Other | Attending: Internal Medicine | Admitting: Internal Medicine

## 2021-02-20 DIAGNOSIS — J9602 Acute respiratory failure with hypercapnia: Secondary | ICD-10-CM | POA: Diagnosis not present

## 2021-02-20 DIAGNOSIS — E1165 Type 2 diabetes mellitus with hyperglycemia: Secondary | ICD-10-CM | POA: Diagnosis present

## 2021-02-20 DIAGNOSIS — M8580 Other specified disorders of bone density and structure, unspecified site: Secondary | ICD-10-CM | POA: Diagnosis present

## 2021-02-20 DIAGNOSIS — J810 Acute pulmonary edema: Secondary | ICD-10-CM

## 2021-02-20 DIAGNOSIS — Z9221 Personal history of antineoplastic chemotherapy: Secondary | ICD-10-CM

## 2021-02-20 DIAGNOSIS — J09X1 Influenza due to identified novel influenza A virus with pneumonia: Secondary | ICD-10-CM | POA: Diagnosis not present

## 2021-02-20 DIAGNOSIS — J9621 Acute and chronic respiratory failure with hypoxia: Secondary | ICD-10-CM | POA: Diagnosis not present

## 2021-02-20 DIAGNOSIS — Z885 Allergy status to narcotic agent status: Secondary | ICD-10-CM

## 2021-02-20 DIAGNOSIS — I10 Essential (primary) hypertension: Secondary | ICD-10-CM | POA: Diagnosis present

## 2021-02-20 DIAGNOSIS — Z9911 Dependence on respirator [ventilator] status: Secondary | ICD-10-CM | POA: Diagnosis not present

## 2021-02-20 DIAGNOSIS — J869 Pyothorax without fistula: Secondary | ICD-10-CM | POA: Diagnosis not present

## 2021-02-20 DIAGNOSIS — F419 Anxiety disorder, unspecified: Secondary | ICD-10-CM | POA: Diagnosis present

## 2021-02-20 DIAGNOSIS — K219 Gastro-esophageal reflux disease without esophagitis: Secondary | ICD-10-CM | POA: Diagnosis present

## 2021-02-20 DIAGNOSIS — J9601 Acute respiratory failure with hypoxia: Secondary | ICD-10-CM | POA: Diagnosis not present

## 2021-02-20 DIAGNOSIS — R0602 Shortness of breath: Secondary | ICD-10-CM | POA: Diagnosis present

## 2021-02-20 DIAGNOSIS — G928 Other toxic encephalopathy: Secondary | ICD-10-CM | POA: Diagnosis not present

## 2021-02-20 DIAGNOSIS — F0631 Mood disorder due to known physiological condition with depressive features: Secondary | ICD-10-CM | POA: Diagnosis not present

## 2021-02-20 DIAGNOSIS — K439 Ventral hernia without obstruction or gangrene: Secondary | ICD-10-CM | POA: Diagnosis present

## 2021-02-20 DIAGNOSIS — Z888 Allergy status to other drugs, medicaments and biological substances status: Secondary | ICD-10-CM

## 2021-02-20 DIAGNOSIS — M810 Age-related osteoporosis without current pathological fracture: Secondary | ICD-10-CM | POA: Diagnosis present

## 2021-02-20 DIAGNOSIS — Z79620 Long term (current) use of immunosuppressive biologic: Secondary | ICD-10-CM

## 2021-02-20 DIAGNOSIS — L7634 Postprocedural seroma of skin and subcutaneous tissue following other procedure: Secondary | ICD-10-CM | POA: Diagnosis present

## 2021-02-20 DIAGNOSIS — G2581 Restless legs syndrome: Secondary | ICD-10-CM | POA: Diagnosis present

## 2021-02-20 DIAGNOSIS — Z803 Family history of malignant neoplasm of breast: Secondary | ICD-10-CM

## 2021-02-20 DIAGNOSIS — J129 Viral pneumonia, unspecified: Secondary | ICD-10-CM | POA: Diagnosis not present

## 2021-02-20 DIAGNOSIS — E872 Acidosis, unspecified: Secondary | ICD-10-CM | POA: Diagnosis present

## 2021-02-20 DIAGNOSIS — Z79899 Other long term (current) drug therapy: Secondary | ICD-10-CM

## 2021-02-20 DIAGNOSIS — J690 Pneumonitis due to inhalation of food and vomit: Secondary | ICD-10-CM | POA: Diagnosis present

## 2021-02-20 DIAGNOSIS — E1142 Type 2 diabetes mellitus with diabetic polyneuropathy: Secondary | ICD-10-CM | POA: Diagnosis present

## 2021-02-20 DIAGNOSIS — Z515 Encounter for palliative care: Secondary | ICD-10-CM | POA: Diagnosis not present

## 2021-02-20 DIAGNOSIS — Z923 Personal history of irradiation: Secondary | ICD-10-CM

## 2021-02-20 DIAGNOSIS — R509 Fever, unspecified: Secondary | ICD-10-CM | POA: Diagnosis not present

## 2021-02-20 DIAGNOSIS — J151 Pneumonia due to Pseudomonas: Secondary | ICD-10-CM | POA: Diagnosis not present

## 2021-02-20 DIAGNOSIS — E7800 Pure hypercholesterolemia, unspecified: Secondary | ICD-10-CM | POA: Diagnosis present

## 2021-02-20 DIAGNOSIS — J398 Other specified diseases of upper respiratory tract: Secondary | ICD-10-CM | POA: Diagnosis present

## 2021-02-20 DIAGNOSIS — Z20822 Contact with and (suspected) exposure to covid-19: Secondary | ICD-10-CM | POA: Diagnosis present

## 2021-02-20 DIAGNOSIS — Z930 Tracheostomy status: Secondary | ICD-10-CM

## 2021-02-20 DIAGNOSIS — Y838 Other surgical procedures as the cause of abnormal reaction of the patient, or of later complication, without mention of misadventure at the time of the procedure: Secondary | ICD-10-CM | POA: Diagnosis present

## 2021-02-20 DIAGNOSIS — Z91013 Allergy to seafood: Secondary | ICD-10-CM

## 2021-02-20 DIAGNOSIS — Z853 Personal history of malignant neoplasm of breast: Secondary | ICD-10-CM

## 2021-02-20 DIAGNOSIS — I480 Paroxysmal atrial fibrillation: Secondary | ICD-10-CM | POA: Diagnosis present

## 2021-02-20 DIAGNOSIS — Z7984 Long term (current) use of oral hypoglycemic drugs: Secondary | ICD-10-CM

## 2021-02-20 DIAGNOSIS — L02211 Cutaneous abscess of abdominal wall: Secondary | ICD-10-CM | POA: Diagnosis present

## 2021-02-20 DIAGNOSIS — R400 Somnolence: Secondary | ICD-10-CM

## 2021-02-20 DIAGNOSIS — R6520 Severe sepsis without septic shock: Secondary | ICD-10-CM | POA: Diagnosis not present

## 2021-02-20 DIAGNOSIS — J1008 Influenza due to other identified influenza virus with other specified pneumonia: Secondary | ICD-10-CM | POA: Diagnosis not present

## 2021-02-20 DIAGNOSIS — A419 Sepsis, unspecified organism: Secondary | ICD-10-CM | POA: Diagnosis present

## 2021-02-20 DIAGNOSIS — R0600 Dyspnea, unspecified: Secondary | ICD-10-CM

## 2021-02-20 DIAGNOSIS — H409 Unspecified glaucoma: Secondary | ICD-10-CM | POA: Diagnosis present

## 2021-02-20 DIAGNOSIS — R0603 Acute respiratory distress: Secondary | ICD-10-CM

## 2021-02-20 DIAGNOSIS — N179 Acute kidney failure, unspecified: Secondary | ICD-10-CM | POA: Diagnosis not present

## 2021-02-20 DIAGNOSIS — J9690 Respiratory failure, unspecified, unspecified whether with hypoxia or hypercapnia: Secondary | ICD-10-CM | POA: Diagnosis present

## 2021-02-20 DIAGNOSIS — Z7189 Other specified counseling: Secondary | ICD-10-CM | POA: Diagnosis not present

## 2021-02-20 DIAGNOSIS — J811 Chronic pulmonary edema: Secondary | ICD-10-CM

## 2021-02-20 DIAGNOSIS — J3800 Paralysis of vocal cords and larynx, unspecified: Secondary | ICD-10-CM | POA: Diagnosis present

## 2021-02-20 DIAGNOSIS — Z931 Gastrostomy status: Secondary | ICD-10-CM

## 2021-02-20 DIAGNOSIS — Z807 Family history of other malignant neoplasms of lymphoid, hematopoietic and related tissues: Secondary | ICD-10-CM

## 2021-02-20 DIAGNOSIS — Z882 Allergy status to sulfonamides status: Secondary | ICD-10-CM

## 2021-02-20 DIAGNOSIS — F32A Depression, unspecified: Secondary | ICD-10-CM | POA: Diagnosis present

## 2021-02-20 DIAGNOSIS — R918 Other nonspecific abnormal finding of lung field: Secondary | ICD-10-CM

## 2021-02-20 DIAGNOSIS — M1990 Unspecified osteoarthritis, unspecified site: Secondary | ICD-10-CM | POA: Diagnosis present

## 2021-02-20 DIAGNOSIS — J189 Pneumonia, unspecified organism: Secondary | ICD-10-CM | POA: Diagnosis not present

## 2021-02-20 LAB — CBC WITH DIFFERENTIAL/PLATELET
Abs Immature Granulocytes: 0.06 10*3/uL (ref 0.00–0.07)
Basophils Absolute: 0 10*3/uL (ref 0.0–0.1)
Basophils Relative: 0 %
Eosinophils Absolute: 0 10*3/uL (ref 0.0–0.5)
Eosinophils Relative: 0 %
HCT: 38.8 % (ref 36.0–46.0)
Hemoglobin: 12.5 g/dL (ref 12.0–15.0)
Immature Granulocytes: 1 %
Lymphocytes Relative: 4 %
Lymphs Abs: 0.4 10*3/uL — ABNORMAL LOW (ref 0.7–4.0)
MCH: 27.3 pg (ref 26.0–34.0)
MCHC: 32.2 g/dL (ref 30.0–36.0)
MCV: 84.7 fL (ref 80.0–100.0)
Monocytes Absolute: 0.6 10*3/uL (ref 0.1–1.0)
Monocytes Relative: 6 %
Neutro Abs: 8.9 10*3/uL — ABNORMAL HIGH (ref 1.7–7.7)
Neutrophils Relative %: 89 %
Platelets: 130 10*3/uL — ABNORMAL LOW (ref 150–400)
RBC: 4.58 MIL/uL (ref 3.87–5.11)
RDW: 14.1 % (ref 11.5–15.5)
WBC: 10 10*3/uL (ref 4.0–10.5)
nRBC: 0 % (ref 0.0–0.2)

## 2021-02-20 LAB — COMPREHENSIVE METABOLIC PANEL
ALT: 41 U/L (ref 0–44)
AST: 59 U/L — ABNORMAL HIGH (ref 15–41)
Albumin: 3.9 g/dL (ref 3.5–5.0)
Alkaline Phosphatase: 69 U/L (ref 38–126)
Anion gap: 10 (ref 5–15)
BUN: 18 mg/dL (ref 8–23)
CO2: 22 mmol/L (ref 22–32)
Calcium: 9.2 mg/dL (ref 8.9–10.3)
Chloride: 102 mmol/L (ref 98–111)
Creatinine, Ser: 0.94 mg/dL (ref 0.44–1.00)
GFR, Estimated: >60 mL/min (ref >60)
Glucose, Bld: 217 mg/dL — ABNORMAL HIGH (ref 70–99)
Potassium: 3.9 mmol/L (ref 3.5–5.1)
Sodium: 134 mmol/L — ABNORMAL LOW (ref 135–145)
Total Bilirubin: 0.9 mg/dL (ref 0.3–1.2)
Total Protein: 7.7 g/dL (ref 6.5–8.1)

## 2021-02-20 LAB — URINALYSIS, COMPLETE (UACMP) WITH MICROSCOPIC
Bilirubin Urine: NEGATIVE
Glucose, UA: NEGATIVE mg/dL
Hgb urine dipstick: NEGATIVE
Ketones, ur: 15 mg/dL — AB
Leukocytes,Ua: NEGATIVE
Nitrite: NEGATIVE
Protein, ur: 30 mg/dL — AB
Specific Gravity, Urine: >1.03 — ABNORMAL HIGH (ref 1.005–1.030)
pH: 5.5 (ref 5.0–8.0)

## 2021-02-20 LAB — APTT: aPTT: 31 seconds (ref 24–36)

## 2021-02-20 LAB — LACTIC ACID, PLASMA: Lactic Acid, Venous: 2.5 mmol/L (ref 0.5–1.9)

## 2021-02-20 LAB — PROTIME-INR
INR: 1.1 (ref 0.8–1.2)
Prothrombin Time: 14.5 seconds (ref 11.4–15.2)

## 2021-02-20 LAB — RESP PANEL BY RT-PCR (FLU A&B, COVID) ARPGX2
Influenza A by PCR: NEGATIVE
Influenza B by PCR: NEGATIVE
SARS Coronavirus 2 by RT PCR: NEGATIVE

## 2021-02-20 MED ORDER — IPRATROPIUM-ALBUTEROL 0.5-2.5 (3) MG/3ML IN SOLN
3.0000 mL | Freq: Four times a day (QID) | RESPIRATORY_TRACT | Status: DC
  Administered 2021-02-20 – 2021-02-27 (×29): 3 mL via RESPIRATORY_TRACT
  Filled 2021-02-20 (×29): qty 3

## 2021-02-20 MED ORDER — FREE WATER
100.0000 mL | Freq: Four times a day (QID) | Status: DC
  Administered 2021-02-20 – 2021-02-21 (×3): 100 mL
  Filled 2021-02-20 (×4): qty 100

## 2021-02-20 MED ORDER — PRAVASTATIN SODIUM 20 MG PO TABS: 20.0000 mg | ORAL_TABLET | Freq: Every day | ORAL | Status: DC

## 2021-02-20 MED ORDER — VANCOMYCIN HCL IN DEXTROSE 1-5 GM/200ML-% IV SOLN
1000.0000 mg | INTRAVENOUS | Status: DC
  Filled 2021-02-20: qty 200

## 2021-02-20 MED ORDER — ACETAMINOPHEN 10 MG/ML IV SOLN
1000.0000 mg | Freq: Four times a day (QID) | INTRAVENOUS | Status: DC
  Administered 2021-02-20: 12:00:00 1000 mg via INTRAVENOUS
  Filled 2021-02-20 (×4): qty 100

## 2021-02-20 MED ORDER — VANCOMYCIN HCL 1500 MG/300ML IV SOLN
1500.0000 mg | Freq: Once | INTRAVENOUS | Status: AC
  Administered 2021-02-20: 23:00:00 1500 mg via INTRAVENOUS
  Filled 2021-02-20 (×2): qty 300

## 2021-02-20 MED ORDER — METRONIDAZOLE 500 MG/100ML IV SOLN
500.0000 mg | Freq: Once | INTRAVENOUS | Status: AC
  Administered 2021-02-20: 18:00:00 500 mg via INTRAVENOUS
  Filled 2021-02-20: qty 100

## 2021-02-20 MED ORDER — BUSPIRONE HCL 15 MG PO TABS
15.0000 mg | ORAL_TABLET | Freq: Two times a day (BID) | ORAL | Status: DC
  Administered 2021-02-20 – 2021-02-22 (×4): 15 mg
  Filled 2021-02-20 (×4): qty 1
  Filled 2021-02-20: qty 3

## 2021-02-20 MED ORDER — HEPARIN SODIUM (PORCINE) 5000 UNIT/ML IJ SOLN
5000.0000 [IU] | Freq: Three times a day (TID) | INTRAMUSCULAR | Status: DC
  Administered 2021-02-20 – 2021-03-01 (×26): 5000 [IU] via SUBCUTANEOUS
  Filled 2021-02-20 (×26): qty 1

## 2021-02-20 MED ORDER — SODIUM CHLORIDE 0.9 % IV SOLN
2.0000 g | Freq: Once | INTRAVENOUS | Status: AC
  Administered 2021-02-20: 14:00:00 2 g via INTRAVENOUS
  Filled 2021-02-20: qty 2

## 2021-02-20 MED ORDER — MELATONIN 5 MG PO TABS
5.0000 mg | ORAL_TABLET | Freq: Every day | ORAL | Status: DC
  Administered 2021-02-20 – 2021-02-21 (×2): 5 mg
  Filled 2021-02-20 (×2): qty 1

## 2021-02-20 MED ORDER — ROPINIROLE HCL 0.25 MG PO TABS
0.5000 mg | ORAL_TABLET | Freq: Every day | ORAL | Status: DC
  Administered 2021-02-20 – 2021-03-03 (×11): 0.5 mg
  Filled 2021-02-20 (×13): qty 2

## 2021-02-20 MED ORDER — GABAPENTIN 600 MG PO TABS
600.0000 mg | ORAL_TABLET | Freq: Three times a day (TID) | ORAL | Status: DC
  Administered 2021-02-20 – 2021-02-22 (×5): 600 mg
  Filled 2021-02-20 (×8): qty 1

## 2021-02-20 MED ORDER — IPRATROPIUM-ALBUTEROL 0.5-2.5 (3) MG/3ML IN SOLN
3.0000 mL | Freq: Four times a day (QID) | RESPIRATORY_TRACT | Status: DC
  Administered 2021-02-20 – 2021-02-21 (×2): 3 mL via RESPIRATORY_TRACT
  Filled 2021-02-20 (×3): qty 3

## 2021-02-20 MED ORDER — JEVITY 1.2 CAL PO LIQD
1000.0000 mL | ORAL | Status: DC
  Administered 2021-02-20: 23:00:00 1000 mL

## 2021-02-20 MED ORDER — METOPROLOL TARTRATE 25 MG PO TABS
25.0000 mg | ORAL_TABLET | Freq: Every day | ORAL | Status: DC
  Administered 2021-02-20 – 2021-03-06 (×15): 25 mg
  Filled 2021-02-20 (×15): qty 1

## 2021-02-20 MED ORDER — SODIUM CHLORIDE 0.9 % IV BOLUS
1000.0000 mL | Freq: Once | INTRAVENOUS | Status: AC
  Administered 2021-02-20: 14:00:00 1000 mL via INTRAVENOUS

## 2021-02-20 MED ORDER — IPRATROPIUM-ALBUTEROL 0.5-2.5 (3) MG/3ML IN SOLN
3.0000 mL | Freq: Once | RESPIRATORY_TRACT | Status: AC
  Administered 2021-02-20: 16:00:00 3 mL via RESPIRATORY_TRACT
  Filled 2021-02-20: qty 3

## 2021-02-20 MED ORDER — LACTATED RINGERS IV SOLN: INTRAVENOUS | Status: AC

## 2021-02-20 MED ORDER — IPRATROPIUM-ALBUTEROL 0.5-2.5 (3) MG/3ML IN SOLN: 3.0000 mL | RESPIRATORY_TRACT | Status: DC | PRN

## 2021-02-20 MED ORDER — PANTOPRAZOLE SODIUM 40 MG PO TBEC
40.0000 mg | DELAYED_RELEASE_TABLET | Freq: Every day | ORAL | Status: DC
  Administered 2021-02-20: 21:00:00 40 mg via ORAL
  Filled 2021-02-20: qty 1

## 2021-02-20 MED ORDER — HAIR/SKIN/NAILS PO TABS: 1.0000 | ORAL_TABLET | Freq: Every day | ORAL | Status: DC

## 2021-02-20 MED ORDER — IOHEXOL 300 MG/ML  SOLN
100.0000 mL | Freq: Once | INTRAMUSCULAR | Status: AC | PRN
  Administered 2021-02-20: 14:00:00 100 mL via INTRAVENOUS

## 2021-02-20 MED ORDER — VITAL HIGH PROTEIN PO LIQD: 1000.0000 mL | ORAL | Status: DC

## 2021-02-20 MED ORDER — ESCITALOPRAM OXALATE 20 MG PO TABS
30.0000 mg | ORAL_TABLET | Freq: Every day | ORAL | Status: DC
  Administered 2021-02-20 – 2021-03-06 (×15): 30 mg
  Filled 2021-02-20 (×14): qty 1
  Filled 2021-02-20: qty 3
  Filled 2021-02-20: qty 1

## 2021-02-20 MED ORDER — SODIUM CHLORIDE 0.9 % IV SOLN
2.0000 g | Freq: Two times a day (BID) | INTRAVENOUS | Status: DC
  Administered 2021-02-21 – 2021-02-27 (×13): 2 g via INTRAVENOUS
  Filled 2021-02-20 (×15): qty 2

## 2021-02-20 MED ORDER — FOLIC ACID 1 MG PO TABS
1.0000 mg | ORAL_TABLET | Freq: Every day | ORAL | Status: DC
  Administered 2021-02-20 – 2021-03-06 (×15): 1 mg
  Filled 2021-02-20 (×15): qty 1

## 2021-02-20 MED ORDER — MECLIZINE HCL 25 MG PO TABS
25.0000 mg | ORAL_TABLET | Freq: Every day | ORAL | Status: DC
  Administered 2021-02-20: 21:00:00 25 mg via ORAL
  Filled 2021-02-20 (×2): qty 1

## 2021-02-20 MED ORDER — INSULIN ASPART 100 UNIT/ML IJ SOLN
0.0000 [IU] | Freq: Three times a day (TID) | INTRAMUSCULAR | Status: DC
  Administered 2021-02-21: 09:00:00 5 [IU] via SUBCUTANEOUS
  Administered 2021-02-21: 12:00:00 2 [IU] via SUBCUTANEOUS
  Filled 2021-02-20 (×2): qty 1

## 2021-02-20 NOTE — ED Notes (Signed)
1 attempt for straight stick for lab draw without success and 1 attempt for IV access with no success. Only able to use the left arm for IV and lab draws. Orders for CT and IV tylenol in place

## 2021-02-20 NOTE — Progress Notes (Signed)
PHARMACY -  BRIEF ANTIBIOTIC NOTE   Pharmacy has received consult(s) for cefepime from an ED provider.  The patient's profile has been reviewed for ht/wt/allergies/indication/available labs.    One time order(s) placed for cefepime 2 g   Further antibiotics/pharmacy consults should be ordered by admitting physician if indicated.                       Thank you, Forde Dandy Fahmida Jurich 02/20/2021  1:55 PM

## 2021-02-20 NOTE — Progress Notes (Signed)
Pharmacy Antibiotic Note  Angela David is a 80 y.o. female admitted on 02/20/2021.  Pharmacy has been consulted for cefepime and vancomycin dosing for abscess. Pt was recently placed on Augmentin for possible cellulitis.   Plan: Cefepime 2 g IV q12h Vancomycin 1500 mg IV x 1 loading dose followed by 1000 mg IV q24h Est AUC: 475.4 Used: Scr 0.94, IBW, Vd 0.72 Obtain vanc level around 4th or 5th dose if continued  Monitor renal function and adjust dose as clinically indicated  Height: 5\' 4"  (162.6 cm) Weight: 73.5 kg (162 lb) IBW/kg (Calculated) : 54.7  Temp (24hrs), Avg:100.5 F (38.1 C), Min:99 F (37.2 C), Max:101.5 F (38.6 C)  Recent Labs  Lab 02/20/21 1137  WBC 10.0  CREATININE 0.94  LATICACIDVEN 2.5*    Estimated Creatinine Clearance: 48.4 mL/min (by C-G formula based on SCr of 0.94 mg/dL).    Allergies  Allergen Reactions   Shellfish Allergy Shortness Of Breath    respitatory distress    Sodium Sulfite Shortness Of Breath    Severe Congestion that creates inability to breath   Sulfa Antibiotics Shortness Of Breath and Other (See Comments)    respitatory distress   Codeine Other (See Comments)    Per patient "it kept her up all night"   Glucerna [Alimentum] Itching   Wound Dressing Adhesive Rash    Antimicrobials this admission: 12/26 metronidazole x 1 12/26 cefepime >>  12/26 vancomycin >>   Microbiology results: 12/26 BCx: sent  Thank you for allowing pharmacy to be a part of this patients care.  Angela David 02/20/2021 4:15 PM

## 2021-02-20 NOTE — Consult Note (Signed)
Date of Consultation:  02/20/2021  Requesting Physician:  Imagene Sheller, DO  Reason for Consultation:  Abdominal wall abscess  History of Present Illness: Angela David is a 80 y.o. female known to surgical team with a prior history of a hand-assisted laparoscopic right hemicolectomy by Dr. Dahlia Byes on 11/06/2018.  The patient also has a history of robotic assisted gastrostomy tube placement with him on 01/29/2020 and more recently a right breast lumpectomy with a robotic assisted incisional ventral hernia repair on 10/18/2020.  Patient was brought to the emergency room today due to hypoxia with oxygen saturation of 80% on room air.  She does have a tracheostomy in place.  During the daytime, she is on room air but does have vent support at nighttime.  The patient reports that recently she had an infection around the gastrostomy tube and was treated with antibiotics and this has improved since.  However she does report some pain around the gastrostomy tube site still.  She also has a cough but chest x-ray does not show any pneumonia.  She did have fever at home and also a fever in the emergency room of 101.5 on initial evaluation.  Patient had a CT scan of the abdomen pelvis today which shows a potential small focal subcutaneous fluid collection to the left of the umbilicus measuring 2.4 x 1.5 x 1.2 cm concerning for possible abscess.   Past Medical History: Past Medical History:  Diagnosis Date   Breast cancer, right ([place]) 2002   Tx'd with chemotherapy and XRT   Chronic respiratory failure ([place])    s/p tracheostomy in 2012; on ventilatory support at hs   Diabetic peripheral neuropathy ([place]) 08/28/2016   GERD (gastroesophageal reflux disease)    Glaucoma    High cholesterol    History of colon polyps    Osteoarthritis    Personal history of chemotherapy 2002   BREAST CA   Personal history of radiation therapy 2002   BREAST CA   S/P percutaneous endoscopic gastrostomy (PEG) tube placement  (H[facility])    T2DM (type 2 diabetes mellitus) ([place])    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   Ventral hernia    Vocal cord paralysis 2012     Past Surgical History: Past Surgical History:  Procedure Laterality Date   ABDOMINAL HYSTERECTOMY     BREAST BIOPSY Right 2008   neg   BREAST BIOPSY Right 06/30/2020   "Q" clip-path pending   BREAST EXCISIONAL BIOPSY Right 2002   positive   BREAST LUMPECTOMY Right 2002   chemo and radiation   BREAST LUMPECTOMY WITH RADIOFREQUENCY TAG IDENTIFICATION Right 10/18/2020   Procedure: BREAST LUMPECTOMY WITH RADIOFREQUENCY TAG IDENTIFICATION;  Surgeon: Jules Husbands, MD;  Location: [facility] ORS;  Service: General;  Laterality: Right;   BREAST SURGERY Right 2002   LUMPECTOMY, radiation, chemotherapy   CATARACT EXTRACTION, BILATERAL     CHOLECYSTECTOMY N/A 12/10/2017   Procedure: LAPAROSCOPIC CHOLECYSTECTOMY;  Surgeon: Benjamine Sprague, DO;  Location: [facility] ORS;  Service: General;  Laterality: N/A;   COLON RESECTION Right 11/06/2018   Procedure: HAND ASSISTED LAPAROSCOPIC COLON RESECTION, RIGHT;  Surgeon: Jules Husbands, MD;  Location: [facility] ORS;  Service: General;  Laterality: Right;   COLONOSCOPY  2009,12/30/2013   COLONOSCOPY WITH PROPOFOL N/A 04/16/2018   Tubulovillous polyp proximal transverse colon, large sessile polyp, proximal transverse colon;  Surgeon: Robert Bellow, MD;  Location: [facility] ENDOSCOPY;  Service: Endoscopy;  Laterality: N/A;   ESOPHAGOGASTRODUODENOSCOPY (EGD) WITH PROPOFOL N/A 10/14/2018   Procedure: ESOPHAGOGASTRODUODENOSCOPY (EGD)  WITH PROPOFOL;  Surgeon: Lucilla Lame, MD;  Location: [facility] ENDOSCOPY;  Service: Endoscopy;  Laterality: N/A;   TRACHEAL SURGERY  2013   TRACHEOSTOMY N/A    Dr Kathyrn Sheriff   XI ROBOTIC ASSISTED VENTRAL HERNIA N/A 10/18/2020   Procedure: XI ROBOTIC ASSISTED VENTRAL HERNIA;  Surgeon: Jules Husbands, MD;  Location: [facility] ORS;  Service: General;  Laterality: N/A;    Home Medications: Prior to Admission medications    Medication Sig Start Date End Date Taking? Authorizing Provider  acetaminophen (TYLENOL) 500 MG tablet Take 1,000 mg by mouth in the morning and at bedtime.   Yes [provider]  amoxicillin-clavulanate (AUGMENTIN) 875-125 MG tablet Take 1 tablet by mouth in the morning and at bedtime. 02/18/21 02/25/21 Yes [provider]  busPIRone (BUSPAR) 15 MG tablet Place 15 mg into feeding tube 2 (two) times daily. 09/26/20  Yes [provider]  carboxymethylcellulose (REFRESH PLUS) 0.5 % SOLN Place 1 drop into both eyes 3 (three) times daily as needed (dry eyes).   Yes [provider]  denosumab (PROLIA) 60 MG/ML SOSY injection Inject 60 mg into the skin every 6 (six) months.   Yes [provider]  EPINEPHrine 0.3 mg/0.3 mL IJ SOAJ injection epinephrine 0.3 mg/0.3 mL injection, auto-injector  Inject 0.3 mLs (0.3 mg dose) into the muscle once as needed for Anaphylaxis for up to 1 dose. 09/10/18  Yes [provider]  escitalopram (LEXAPRO) 10 MG tablet Place 3 tablets (30 mg total) into feeding tube daily. Patient taking differently: Place 10 mg into feeding tube daily. 02/13/20  Yes Kasa, Maretta Bees, MD  escitalopram (LEXAPRO) 20 MG tablet Place 20 mg into feeding tube daily.   Yes [provider]  folic acid (FOLVITE) 1 MG tablet Place 1 tablet (1 mg total) into feeding tube daily. 02/13/20  Yes Flora Lipps, MD  gabapentin (NEURONTIN) 600 MG tablet Place 600 mg into feeding tube 3 (three) times daily.   Yes [provider]  lovastatin (MEVACOR) 20 MG tablet Place 20 mg into feeding tube daily. 06/13/20  Yes [provider]  meclizine (ANTIVERT) 25 MG tablet Take 25 mg by mouth daily. 02/17/21  Yes [provider]  melatonin 5 MG TABS Place 1 tablet (5 mg total) into feeding tube at bedtime. 02/13/20  Yes Flora Lipps, MD  metFORMIN (GLUCOPHAGE) 1000 MG tablet Place 1,000 mg into feeding tube daily with breakfast. 06/10/20   Yes [provider]  metoprolol tartrate (LOPRESSOR) 25 MG tablet Place 25 mg into feeding tube daily. 06/13/20  Yes [provider]  Multiple Vitamins-Minerals (HAIR/SKIN/NAILS) TABS Take 1 tablet by mouth daily.   Yes [provider]  [facility] powder Apply topically. 01/18/21  Yes [provider]  pantoprazole (PROTONIX) 40 MG tablet Take 40 mg by mouth daily.   Yes [provider]  rOPINIRole (REQUIP) 0.5 MG tablet Place 1 tablet (0.5 mg total) into feeding tube at bedtime. 02/13/20  Yes Kasa, Maretta Bees, MD  sodium chloride 0.9 % nebulizer solution sodium chloride 0.9 % for nebulization  USE 5 ML BY NEBULIZTION AS NEEDED FOR WHEEZING 07/17/19  Yes [provider]    Allergies: Allergies  Allergen Reactions   Shellfish Allergy Shortness Of Breath    respitatory distress    Sodium Sulfite Shortness Of Breath    Severe Congestion that creates inability to breath   Sulfa Antibiotics Shortness Of Breath and Other (See Comments)    respitatory distress   Codeine Other (See Comments)  Per patient "it kept her up all night"   Glucerna [Alimentum] Itching   Wound Dressing Adhesive Rash    Social History:  reports that she has never smoked. She has never used smokeless tobacco. She reports that she does not drink alcohol and does not use drugs.   Family History: Family History  Problem Relation Age of Onset   Cancer Father        bone cancer   Cancer Sister        lymphoma   Cancer Brother    Breast cancer Neg Hx     Review of Systems: Review of Systems  Constitutional:  Positive for fever. Negative for chills.  HENT:  Negative for hearing loss.   Respiratory:  Positive for cough and shortness of breath.   Cardiovascular:  Negative for chest pain.  Gastrointestinal:  Positive for abdominal pain. Negative for diarrhea, nausea and vomiting.  Genitourinary:  Negative for dysuria.  Musculoskeletal:  Negative for myalgias.  Skin:   Negative for rash.  Neurological:  Negative for dizziness.  Psychiatric/Behavioral:  Negative for depression.    Physical Exam BP (!) 146/85    Pulse 100    Temp 99 F (37.2 C) (Oral)    Resp 20    Ht 5\' 4"  (1.626 m)    Wt 73.5 kg    SpO2 91%    BMI 27.81 kg/m  CONSTITUTIONAL: No acute distress, currently getting nebulizer treatments HEENT:  Normocephalic, atraumatic, extraocular motion intact. NECK: Trachea is midline, and there is no jugular venous distension. RESPIRATORY:  Normal respiratory effort without pathologic use of accessory muscles.  She is able to mouth words without respiratory distress CARDIOVASCULAR: Regular rhythm and rate. GI: The abdomen is soft, nondistended, with some soreness at the gastrostomy tube insertion site.  There is very minimal remaining erythema particularly where the tube inserts which is likely related to some irritation from mild leakage around the tube.  At the area of concern on the CT scan, the patient does have an area of firmness which is to the left of the umbilicus.  This is the area where the patient had her ventral hernia repair and most likely reflects residual seroma from the hernia sac.  There is no overlying skin erythema or induration. MUSCULOSKELETAL:  Normal muscle strength and tone in all four extremities.  No peripheral edema or cyanosis. SKIN: Skin turgor is normal. There are no pathologic skin lesions.  NEUROLOGIC:  Motor and sensation is grossly normal.  Cranial nerves are grossly intact. PSYCH:  Alert and oriented to person, place and time. Affect is normal.  Laboratory Analysis: Results for orders placed or performed during the hospital encounter of 02/20/21 (from the past 24 hour(s))  Lactic acid, plasma     Status: Abnormal   Collection Time: 02/20/21 11:37 AM  Result Value Ref Range   Lactic Acid, Venous 2.5 (HH) 0.5 - 1.9 mmol/L  Comprehensive metabolic panel     Status: Abnormal   Collection Time: 02/20/21 11:37 AM  Result  Value Ref Range   Sodium 134 (L) 135 - 145 mmol/L   Potassium 3.9 3.5 - 5.1 mmol/L   Chloride 102 98 - 111 mmol/L   CO2 22 22 - 32 mmol/L   Glucose, Bld 217 (H) 70 - 99 mg/dL   BUN 18 8 - 23 mg/dL   Creatinine, Ser 0.94 0.44 - 1.00 mg/dL   Calcium 9.2 8.9 - 10.3 mg/dL   Total Protein 7.7 6.5 - 8.1 g/dL  Albumin 3.9 3.5 - 5.0 g/dL   AST 59 (H) 15 - 41 U/L   ALT 41 0 - 44 U/L   Alkaline Phosphatase 69 38 - 126 U/L   Total Bilirubin 0.9 0.3 - 1.2 mg/dL   GFR, Estimated >60 >60 mL/min   Anion gap 10 5 - 15  CBC with Differential     Status: Abnormal   Collection Time: 02/20/21 11:37 AM  Result Value Ref Range   WBC 10.0 4.0 - 10.5 K/uL   RBC 4.58 3.87 - 5.11 MIL/uL   Hemoglobin 12.5 12.0 - 15.0 g/dL   HCT 38.8 36.0 - 46.0 %   MCV 84.7 80.0 - 100.0 fL   MCH 27.3 26.0 - 34.0 pg   MCHC 32.2 30.0 - 36.0 g/dL   RDW 14.1 11.5 - 15.5 %   Platelets 130 (L) 150 - 400 K/uL   nRBC 0.0 0.0 - 0.2 %   Neutrophils Relative % 89 %   Neutro Abs 8.9 (H) 1.7 - 7.7 K/uL   Lymphocytes Relative 4 %   Lymphs Abs 0.4 (L) 0.7 - 4.0 K/uL   Monocytes Relative 6 %   Monocytes Absolute 0.6 0.1 - 1.0 K/uL   Eosinophils Relative 0 %   Eosinophils Absolute 0.0 0.0 - 0.5 K/uL   Basophils Relative 0 %   Basophils Absolute 0.0 0.0 - 0.1 K/uL   Immature Granulocytes 1 %   Abs Immature Granulocytes 0.06 0.00 - 0.07 K/uL  Resp Panel by RT-PCR (Flu A&B, Covid)     Status: None   Collection Time: 02/20/21  2:23 PM   Specimen: Nasopharyngeal(NP) swabs in vial transport medium  Result Value Ref Range   SARS Coronavirus 2 by RT PCR NEGATIVE NEGATIVE   Influenza A by PCR NEGATIVE NEGATIVE   Influenza B by PCR NEGATIVE NEGATIVE  Protime-INR     Status: None   Collection Time: 02/20/21  2:23 PM  Result Value Ref Range   Prothrombin Time 14.5 11.4 - 15.2 seconds   INR 1.1 0.8 - 1.2  APTT     Status: None   Collection Time: 02/20/21  2:23 PM  Result Value Ref Range   aPTT 31 24 - 36 seconds  Urinalysis,  Complete w Microscopic     Status: Abnormal   Collection Time: 02/20/21  2:23 PM  Result Value Ref Range   Color, Urine YELLOW YELLOW   APPearance CLEAR CLEAR   Specific Gravity, Urine >1.030 (H) 1.005 - 1.030   pH 5.5 5.0 - 8.0   Glucose, UA NEGATIVE NEGATIVE mg/dL   Hgb urine dipstick NEGATIVE NEGATIVE   Bilirubin Urine NEGATIVE NEGATIVE   Ketones, ur 15 (A) NEGATIVE mg/dL   Protein, ur 30 (A) NEGATIVE mg/dL   Nitrite NEGATIVE NEGATIVE   Leukocytes,Ua NEGATIVE NEGATIVE   Squamous Epithelial / LPF 0-5 0 - 5   WBC, UA 0-5 0 - 5 WBC/hpf   RBC / HPF 0-5 0 - 5 RBC/hpf   Bacteria, UA FEW (A) NONE SEEN    Imaging: DG Chest 2 View  Result Date: 02/20/2021 CLINICAL DATA:  Fever. EXAM: CHEST - 2 VIEW COMPARISON:  02/01/2020 FINDINGS: Tracheostomy tube again noted. Asymmetric elevation right hemidiaphragm similar to prior. Interstitial markings are diffusely coarsened with chronic features. Stable right hilar prominence, likely vascular IMPRESSION: No active cardiopulmonary disease. Electronically Signed   By: Misty Stanley M.D.   On: 02/20/2021 09:49   CT ABDOMEN PELVIS W CONTRAST  Result Date: 02/20/2021 CLINICAL DATA:  Abdominal pain, painful PEG tube since Friday with drainage and tenderness, febrile; history breast cancer post lumpectomy, radiation therapy, chemotherapy, type II diabetes mellitus EXAM: CT ABDOMEN AND PELVIS WITH CONTRAST TECHNIQUE: Multidetector CT imaging of the abdomen and pelvis was performed using the standard protocol following bolus administration of intravenous contrast. CONTRAST:  132mL OMNIPAQUE IOHEXOL 300 MG/ML SOLN IV. No oral contrast. COMPARISON:  12/25/2018 FINDINGS: Lower chest: Dependent bibasilar atelectasis. Distended esophagus with air-fluid level. Hepatobiliary: Post cholecystectomy. Fatty infiltration of liver. No focal hepatic mass. Calcified granuloma LEFT lobe liver. Pancreas: Normal appearance Spleen: Normal appearance Adrenals/Urinary Tract:  Adrenal glands, kidneys, ureters, and bladder normal appearance Stomach/Bowel: Normal appendix. Large and small bowel loops normal appearance. PEG tube balloon within stomach but the balloon and stomach appear partially retracted into the anterior abdominal wall. Questionable wall thickening of adjacent stomach versus artifact from underdistention. Vascular/Lymphatic: Atherosclerotic calcifications aorta and iliac arteries without aneurysm. Few normal sized retroperitoneal and mesenteric lymph nodes. No abdominal or pelvic adenopathy. Reproductive: Uterus surgically absent. Question atrophic LEFT ovary. Other: No free air or free fluid. Small supra umbilical hernia containing fat. Soft tissue thickening at the umbilicus and underlying anterior abdominal wall fascial planes. Small focal subcutaneous fluid collection with enhancing rim LEFT periumbilical 2.4 x 1.5 x 1.2 cm, new, question abscess. Musculoskeletal: Osseous demineralization. IMPRESSION: PEG tube balloon within stomach but the balloon and stomach appear partially retracted into the anterior abdominal wall question hernia versus abdominal wall laxity. Questionable gastric wall thickening versus artifact from underdistention. Soft tissue thickening at the umbilicus and underlying anterior abdominal wall fascial planes with a small subcutaneous fluid collection with enhancing rim LEFT periumbilical 2.4 x 1.5 x 1.2 cm, question abscess. Distended esophagus with air-fluid level. Small supra umbilical hernia containing fat. Fatty infiltration of liver. Aortic Atherosclerosis (ICD10-I70.0). Electronically Signed   By: Lavonia Dana M.D.   On: 02/20/2021 14:12    Assessment and Plan: This is a 80 y.o. female with a possible subcutaneous periumbilical abscess.  - I evaluated the patient and the area of concern on the CT scan does not show any overlying erythema or induration.  The area does feel firm and I think based on where the prior incisional hernia was  located, I think this is remnant of the hernia sac rather than a true abscess or infection.  The area around the G-tube has very minimal erythema.  On the CT scan, it does appear that the G-tube is being pulled too tightly so I have relaxed that about 5 mm so there is less discomfort at the skin.  At this point, I do not see any true evidence of infection I do not think the patient warrants any surgical intervention.  Okay to use feeding tube from surgical standpoint. - Please let us know if there is any other concerns or if we can be of any further assistance.  I spent 55 minutes dedicated to the care of this patient on the date of this encounter to include pre-visit review of records, face-to-face time with the patient discussing diagnosis and management, and any post-visit coordination of care.   Melvyn Neth, MD [facility] Pg:  [phone]

## 2021-02-20 NOTE — ED Notes (Signed)
Pt turned call light on and told this NT that she felt as if her trach was clogged. This Nt called RT to ask them to suction Pt trach.

## 2021-02-20 NOTE — ED Notes (Addendum)
RN was called into pt room via call bell. RN walked into room to find pt waving her hands above her head with trach and vent circuit on the bed. Pt was dusky and grey with blue lips. RN deflated cuff and reinserted trach and than inflated cuff and attached vent circuit to trach. Pt o2 sat was 48% on RA when RN hooked pt back up to the vent. Pt was able to take multiple deap breaths and o2 sat came back up WNL. Pt sts " I pulled out my trach cause I was not able to breath. My dr told me to do that if I am unable to breath."  RN notified ICU NP and resp of event.

## 2021-02-20 NOTE — Progress Notes (Signed)
PHARMACIST - PHYSICIAN ORDER COMMUNICATION  CONCERNING: P&T Medication Policy on Herbal/Natural Medications  DESCRIPTION:  This patients order for:  Hair/skin/nails tablet  has been noted.  This product(s) is classified as an herbal or natural product. Due to a lack of definitive safety studies or FDA approval, nonstandard manufacturing practices, plus the potential risk of unknown drug-drug interactions while on inpatient medications, the Pharmacy and Therapeutics Committee does not permit the use of herbal or natural products of this type within [facility].   ACTION TAKEN: The pharmacy department is unable to verify this order at this time. Please reevaluate patients clinical condition at discharge and address if the herbal or natural product(s) should be resumed at that time.   Chinita Greenland PharmD Clinical Pharmacist 02/20/2021

## 2021-02-20 NOTE — ED Triage Notes (Signed)
Pt comes into the ED via EMS from home with c/o pain and drainage around her peg tube since Friday. States it is still working properly just very painful and irritated.

## 2021-02-20 NOTE — Consult Note (Addendum)
NAME:  Angela David, MRN:  [id number], DOB:  Apr 04, 1940, LOS: 0 ADMISSION DATE:  02/20/2021, CONSULTATION DATE:  02/20/2021 REFERRING MD: Imagene Sheller, MD. 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  Patient reports having some pain, irritation and drainage around the PEG site since Friday. Later she developed fever, chills, generalized  malaise and SOB. Patient was started on Augmentin for suspected cellulitis of  around PEG site with no improvement.  ED Course: On arrival to the ED, she was febrile 101.5 (38.6) with blood pressure 126/65 mm Hg and pulse rate 111 beats/min, RR 27 with sats 87% on RA. There were no focal neurological deficits;  Patient mildly tachycardic mildly tachypneic hypoxic with mild abdominal pain. Given recent treatment for cellulitis, CT imaging was ordered.  Pertinent Labs in Red/Diagnostics Findings: Na+/ K+: 134/3.9 Glucose: 217 BUN/Cr.: 18/0.94 AST/ALT:59/41   WBC/ TMAX: 10.0/ afebrile PCT: pending Lactic acid: 2.5 COVID PCR: Negative  Patient had a CT scan of the abdomen pelvis today which shows a potential small focal subcutaneous fluid collection to the left of the umbilicus measuring 2.4 x 1.5 x 1.2 cm concerning for possible abscess. Surgery consulted for further evaluation.  Patient also with O2 requirement and possibly developing pneumonia but no infiltrates on chest x-ray. Patient was started on broad spectrum antibiotics and admitted to hospitalist service. PCCM consulted to assist with vent management.   Past Medical History   Right Breast Cancer Tx'd with chemotherapy and  XRT   Chronic respiratory failure ([place])    s/p tracheostomy in 2012; on ventilatory support at hs  Diabetic peripheral neuropathy ([place]) 08/28/2016  GERD (gastroesophageal reflux disease)    Glaucoma    High cholesterol    History of colon polyps    Osteoarthritis    Personal history of chemotherapy 2002  BREAST CA  Personal history of radiation therapy 2002  BREAST CA  S/P percutaneous endoscopic gastrostomy (PEG) tube placement (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 12/26: CTA abdomen and pelvis> shows a potential small focal subcutaneous fluid collection to the left of the umbilicus measuring 2.4 x 1.5 x 1.2 cm concerning for possible abscess  Micro Data:  12/26: SARS-CoV-2 PCR> negative 12/26: Influenza PCR> negative 12/26: Blood culture x2> 12/26: Urine Culture> 12/26: MRSA PCR>>  12/26: Strep pneumo urinary antigen> 12/26: Legionella urinary antigen> Antimicrobials:  12/26 metronidazole x 1 12/26 cefepime >>  12/26 vancomycin >>    OBJECTIVE  Blood pressure 126/69, pulse (!) 103, temperature 99 F (37.2 C), temperature source Oral, resp. rate 20, height '5\' 4"'  (1.626 m), weight 73.5 kg, SpO2 94 %.    FiO2 (%):  [28 %] 28 %  No intake or output data in the 24 hours ending 02/20/21 2037 Filed Weights   02/20/21 0922  Weight:  73.5 kg   Physical Examination  GENERAL: 80 year-old critically ill patient lying in the bed with no acute distress.  EYES: Pupils equal, round, reactive to light and accommodation. No scleral icterus. Extraocular muscles intact.  HEENT: Head atraumatic, normocephalic. Oropharynx and  nasopharynx clear.  NECK:  Trachea midline, on trach collar.  Neck Supple, no jugular venous distention. No thyroid enlargement, no tenderness.  LUNGS: Normal breath sounds bilaterally, no wheezing, rales,rhonchi or crepitation. No use of accessory muscles of respiration.  CARDIOVASCULAR: S1, S2 normal. No murmurs, rubs, or gallops.  ABDOMEN: Soft, nontender, nondistended. Bowel sounds present. No organomegaly or mass.  Mild irritation around the tube insertion site with minimal erythema and leakage. EXTREMITIES: No pedal edema, cyanosis, or clubbing.  NEUROLOGIC: Cranial nerves II through XII are intact.  Muscle strength 5/5 in all extremities. Sensation intact. Gait not checked.  PSYCHIATRIC: The patient is alert and oriented x 3. Nods appropriately to orientation questions, SKIN: No obvious rash, lesion, or ulcer.   Labs/imaging that I havepersonally reviewed  (right click and "Reselect all SmartList Selections" daily)     Labs   CBC: Recent Labs  Lab 02/20/21 1137  WBC 10.0  NEUTROABS 8.9*  HGB 12.5  HCT 38.8  MCV 84.7  PLT 130*    Basic Metabolic Panel: Recent Labs  Lab 02/20/21 1137  NA 134*  K 3.9  CL 102  CO2 22  GLUCOSE 217*  BUN 18  CREATININE 0.94  CALCIUM 9.2   GFR: Estimated Creatinine Clearance: 48.4 mL/min (by C-G formula based on SCr of 0.94 mg/dL). Recent Labs  Lab 02/20/21 1137  WBC 10.0  LATICACIDVEN 2.5*    Liver Function Tests: Recent Labs  Lab 02/20/21 1137  AST 59*  ALT 41  ALKPHOS 69  BILITOT 0.9  PROT 7.7  ALBUMIN 3.9   No results for input(s): LIPASE, AMYLASE in the last 168 hours. No results for input(s): AMMONIA in the last 168 hours.  ABG    Component Value Date/Time   PHART 7.44 02/03/2020 1055   PCO2ART 53 (H) 02/03/2020 1055   PO2ART 92 02/03/2020 1055   HCO3 36.0 (H) 02/03/2020 1055   ACIDBASEDEF 0.9 01/19/2020 1024   O2SAT 97.4 02/03/2020 1055     Coagulation Profile: Recent Labs  Lab 02/20/21 1423  INR  1.1    Cardiac Enzymes: No results for input(s): CKTOTAL, CKMB, CKMBINDEX, TROPONINI in the last 168 hours.  HbA1C: Hgb A1c MFr Bld  Date/Time Value Ref Range Status  01/20/2020 07:19 AM 6.3 (H) 4.8 - 5.6 % Final    Comment:    (NOTE) Pre diabetes:          5.7%-6.4%  Diabetes:              >6.4%  Glycemic control for   <7.0% adults with diabetes   03/21/2019 03:24 AM 6.6 (H) 4.8 - 5.6 % Final    Comment:    (NOTE) Pre diabetes:          5.7%-6.4% Diabetes:              >6.4% Glycemic control for   <7.0% adults with diabetes     CBG: No results for input(s): GLUCAP in the last 168 hours.  Review of Systems:   Patient is trached unable to obtain ROS  Past Medical History  She,  has a past medical history of Breast cancer, right ([place]) (2002), Chronic respiratory failure ([place]), Diabetic peripheral neuropathy ([place]) (08/28/2016), GERD (gastroesophageal reflux disease),  Glaucoma, High cholesterol, History of colon polyps, Osteoarthritis, Personal history of chemotherapy (2002), Personal history of radiation therapy (2002), S/P percutaneous endoscopic gastrostomy (PEG) tube placement ([place]), T2DM (type 2 diabetes mellitus) ([place]), Tracheostomy dependent ([place]) (2012), Ventral hernia, and Vocal cord paralysis (2012).   Surgical History    Past Surgical History:  Procedure Laterality Date   ABDOMINAL HYSTERECTOMY     BREAST BIOPSY Right 2008   neg   BREAST BIOPSY Right 06/30/2020   "Q" clip-path pending   BREAST EXCISIONAL BIOPSY Right 2002   positive   BREAST LUMPECTOMY Right 2002   chemo and radiation   BREAST LUMPECTOMY WITH RADIOFREQUENCY TAG IDENTIFICATION Right 10/18/2020   Procedure: BREAST LUMPECTOMY WITH RADIOFREQUENCY TAG IDENTIFICATION;  Surgeon: Jules Husbands, MD;  Location: [facility] ORS;  Service: General;  Laterality: Right;   BREAST SURGERY Right 2002   LUMPECTOMY, radiation, chemotherapy   CATARACT EXTRACTION, BILATERAL     CHOLECYSTECTOMY N/A 12/10/2017    Procedure: LAPAROSCOPIC CHOLECYSTECTOMY;  Surgeon: Benjamine Sprague, DO;  Location: [facility] ORS;  Service: General;  Laterality: N/A;   COLON RESECTION Right 11/06/2018   Procedure: HAND ASSISTED LAPAROSCOPIC COLON RESECTION, RIGHT;  Surgeon: Jules Husbands, MD;  Location: [facility] ORS;  Service: General;  Laterality: Right;   COLONOSCOPY  2009,12/30/2013   COLONOSCOPY WITH PROPOFOL N/A 04/16/2018   Tubulovillous polyp proximal transverse colon, large sessile polyp, proximal transverse colon;  Surgeon: Robert Bellow, MD;  Location: [facility] END[facility];  Service: Endoscopy;  Laterality: N/A;   ESOPHAGOGASTRODUODENOSCOPY (EGD) WITH PROPOFOL N/A 10/14/2018   Procedure: ESOPHAGOGASTRODUODENOSCOPY (EGD) WITH PROPOFOL;  Surgeon: Lucilla Lame, MD;  Location: [facility] END[facility];  Service: Endoscopy;  Laterality: N/A;   TRACHEAL SURGERY  2013   TRACHEOSTOMY N/A    Dr Kathyrn Sheriff   XI ROBOTIC ASSISTED VENTRAL HERNIA N/A 10/18/2020   Procedure: XI ROBOTIC ASSISTED VENTRAL HERNIA;  Surgeon: Jules Husbands, MD;  Location: [facility] ORS;  Service: General;  Laterality: N/A;     Social History   reports that she has never smoked. She has never used smokeless tobacco. She reports that she does not drink alcohol and does not use drugs.   Family History   Her family history includes Cancer in her brother, father, and sister. There is no history of Breast cancer.   Allergies Allergies  Allergen Reactions   Shellfish Allergy Shortness Of Breath    respitatory distress    Sodium Sulfite Shortness Of Breath    Severe Congestion that creates inability to breath   Sulfa Antibiotics Shortness Of Breath and Other (See Comments)    respitatory distress   Codeine Other (See Comments)    Per patient "it kept her up all night"   Glucerna [Alimentum] Itching   Wound Dressing Adhesive Rash     Home Medications  Prior to Admission medications   Medication Sig Start Date End Date Taking? Authorizing Provider  acetaminophen (TYLENOL) 500  MG tablet Take 1,000 mg by mouth in the morning and at bedtime.   Yes [provider]  amoxicillin-clavulanate (AUGMENTIN) 875-125 MG tablet Take 1 tablet by mouth in the morning and at bedtime. 02/18/21 02/25/21 Yes [provider]  busPIRone (BUSPAR) 15 MG tablet Place 15 mg into feeding tube 2 (two) times daily. 09/26/20  Yes [provider]  carboxymethylcellulose (REFRESH PLUS) 0.5 % SOLN Place 1 drop into both eyes 3 (three) times daily as needed (dry eyes).   Yes [provider]  denosumab (PROLIA) 60 MG/ML SOSY injection Inject 60 mg  into the skin every 6 (six) months.   Yes [provider]  EPINEPHrine 0.3 mg/0.3 mL IJ SOAJ injection epinephrine 0.3 mg/0.3 mL injection, auto-injector  Inject 0.3 mLs (0.3 mg dose) into the muscle once as needed for Anaphylaxis for up to 1 dose. 09/10/18  Yes [provider]  escitalopram (LEXAPRO) 10 MG tablet Place 3 tablets (30 mg total) into feeding tube daily. Patient taking differently: Place 10 mg into feeding tube daily. 02/13/20  Yes Kasa, Maretta Bees, MD  escitalopram (LEXAPRO) 20 MG tablet Place 20 mg into feeding tube daily.   Yes [provider]  folic acid (FOLVITE) 1 MG tablet Place 1 tablet (1 mg total) into feeding tube daily. 02/13/20  Yes Flora Lipps, MD  gabapentin (NEURONTIN) 600 MG tablet Place 600 mg into feeding tube 3 (three) times daily.   Yes [provider]  lovastatin (MEVACOR) 20 MG tablet Place 20 mg into feeding tube daily. 06/13/20  Yes [provider]  meclizine (ANTIVERT) 25 MG tablet Take 25 mg by mouth daily. 02/17/21  Yes [provider]  melatonin 5 MG TABS Place 1 tablet (5 mg total) into feeding tube at bedtime. 02/13/20  Yes Flora Lipps, MD  metFORMIN (GLUCOPHAGE) 1000 MG tablet Place 1,000 mg into feeding tube daily with breakfast. 06/10/20  Yes [provider]  metoprolol tartrate (LOPRESSOR) 25 MG tablet Place 25 mg into  feeding tube daily. 06/13/20  Yes [provider]  Multiple Vitamins-Minerals (HAIR/SKIN/NAILS) TABS Take 1 tablet by mouth daily.   Yes [provider]  [facility] powder Apply topically. 01/18/21  Yes [provider]  pantoprazole (PROTONIX) 40 MG tablet Take 40 mg by mouth daily.   Yes [provider]  rOPINIRole (REQUIP) 0.5 MG tablet Place 1 tablet (0.5 mg total) into feeding tube at bedtime. 02/13/20  Yes Kasa, Maretta Bees, MD  sodium chloride 0.9 % nebulizer solution sodium chloride 0.9 % for nebulization  USE 5 ML BY NEBULIZTION AS NEEDED FOR WHEEZING 07/17/19  Yes [provider]  Scheduled Meds:  busPIRone  15 mg Per Tube BID   escitalopram  30 mg Per Tube Daily   folic acid  1 mg Per Tube Daily   free water  100 mL Per Tube Q6H   gabapentin  600 mg Per Tube TID   heparin  5,000 Units Subcutaneous Q8H   [START ON 02/21/2021] insulin aspart  0-15 Units Subcutaneous TID WC   ipratropium-albuterol  3 mL Nebulization Q6H   ipratropium-albuterol  3 mL Nebulization Q6H   meclizine  25 mg Oral Daily   melatonin  5 mg Per Tube QHS   metoprolol tartrate  25 mg Per Tube Daily   pantoprazole  40 mg Oral Daily   [START ON 02/21/2021] pravastatin  20 mg Oral q1800   rOPINIRole  0.5 mg Per Tube QHS   Continuous Infusions:  acetaminophen Stopped (02/20/21 1324)   [START ON 02/21/2021] ceFEPime (MAXIPIME) IV     feeding supplement (JEVITY 1.2 CAL)     lactated ringers 150 mL/hr at 02/20/21 1747   [START ON 02/21/2021] vancomycin     vancomycin     PRN Meds:.ipratropium-albuterol  Assessment & Plan:  Acute  on Chronic Hypoxic & Respiratory Failure Secondary to suspected aspiration pneumonia in the setting of 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 Hx: Tracheal stenosis -Supplemental O2 as needed to maintain O2 sats >92% -Trach collar during the day with vent qhs (tolerating) -Follow intermittent CXR & ABG as needed -VAP protocol -Ensure adequate  pulmonary hygiene   -Budesonide inhaler nebs BID, Bronchodilators PRN -Hold sedation/analgesia for RASS 0  Sepsis due to suspected Abdominal Abscess and Probable Pneumonia Met sepsis criteria Lactic: 2.5, Baseline PCT: pending, UA: Pyuria no evidence of UTI, CXR: No active cardiopulmonary disease, CT: Possible Abdominal wall abscess?  -F/u cultures, trend lactic/ PCT -Monitor WBC/ fever curve -IV antibiotics: cefepime & vancomycin -IVF hydration as needed -Pressors for MAP goal >65 -Strict I/O's  Possible subcutaneous periumbilical abscess CT Abdomen/Pelvis shows shows a potential small focal subcutaneous fluid collection to the left of the umbilicus measuring 2.4 x 1.5 x 1.2 cm concerning for possible abscess with G-tube is being pulled too tightly. -Keep NPO -General surgery consult  Diabetes mellitus      -CBG's -SSI and Lantus -Follow ICU Hypo/hyperglycemia protocol    Nutritional Status/Diet -NPO for now pending PEG patency evaluation -Will start trickle TF per PEG once General Surgery ok to use -Dietician following, TF's as tolerated -Constipation protocol as indicated    Electrolytes -follow labs as needed -replace as needed -pharmacy consultation and following    Best practice:  Diet:  Tube Feed  Pain/Anxiety/Delirium protocol (if indicated): No VAP protocol (if indicated): Yes DVT prophylaxis: Subcutaneous Heparin GI prophylaxis: PPI Glucose control:  SSI Yes Central venous access:  N/A Arterial line:  N/A Foley:  N/A Mobility:  bed rest  PT consulted: N/A Last date of multidisciplinary goals of care discussion [12/26] Code Status:  limited Disposition: Step down   = Goals of Care = Code Status Order: PARTIAL  Primary Emergency Contact: Weiand,Joseph, Home Phone: [phone] Patient wishes to pursue ongoing treatment with relatively conservative measures (e.g., IV fluids, antibiotics, Defibrillation, Intubation, ACLS meds), but in the event of cardiac or respiratory  arrest would not wish to have CPR.  Critical care time: 45 minutes     Rufina Falco, DNP, CCRN, FNP-C, AGACNP-BC Acute Care Nurse Practitioner  [facility] Pager: [phone] [place] at [facility]  .

## 2021-02-20 NOTE — ED Notes (Addendum)
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

## 2021-02-20 NOTE — ED Notes (Signed)
Informed RN Erlene Quan of bed assignment

## 2021-02-20 NOTE — H&P (Signed)
H&P:    Angela David   YBW:[id number] DOB: 1940/09/14 DOA: 02/20/2021  PCP: Iva Boop, MD  Chief Complaint: Fevers, cough   History of Present Illness:    HPI: Angela David is a 80 y.o. 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  This patient presents from home due to fevers that started this morning.  As high as 101 F at home.  She also endorses shortness of breath that started yesterday.  Also has a dry cough that started yesterday as well.  Endorses generalized abdominal pain.  She is not encephalopathic.  No family present at bedside upon my evaluation.  Denies any urinary complaints.  ED Course: Given Tylenol IV, broad-spectrum antibiotics with Maxipime and Flagyl.  Given IV fluids with normal saline 1 L bolus and 1 DuoNeb treatment.  Was initially 87% on room air and now on 5 L via blow-by oxygen.  EKG shows sinus tach rate of 111.  Labs are fairly unremarkable besides elevated lactic acid of 2.5.  Urinalysis unremarkable.  Imaging showed possible deep abdominal wall abscess.  The patient is also noted to be febrile in the emergency department.  Chest x-ray shows no signs of infiltrate or consolidation consistent with pneumonia.  COVID-19 PCR was negative.    ROS:   14 point review of systems is negative except for what is mentioned above in the HPI.   Past Medical History:   Past Medical History:  Diagnosis Date   Breast cancer, right ([place]) 2002   Tx'd with chemotherapy and XRT   Chronic respiratory failure ([place])    s/p tracheostomy in 2012; on ventilatory support at hs   Diabetic peripheral neuropathy ([place]) 08/28/2016   GERD  (gastroesophageal reflux disease)    Glaucoma    High cholesterol    History of colon polyps    Osteoarthritis    Personal history of chemotherapy 2002   BREAST CA   Personal history of radiation therapy 2002   BREAST CA   S/P percutaneous endoscopic gastrostomy (PEG) tube placement ([facility])    T2DM (type 2 diabetes mellitus) ([place])    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   Ventral hernia    Vocal cord paralysis 2012    Past Surgical History:   Past Surgical History:  Procedure Laterality Date   ABDOMINAL HYSTERECTOMY     BREAST BIOPSY Right 2008   neg   BREAST BIOPSY Right 06/30/2020   "Q" clip-path pending   BREAST EXCISIONAL BIOPSY Right 2002   positive   BREAST LUMPECTOMY Right 2002   chemo and radiation   BREAST LUMPECTOMY WITH RADIOFREQUENCY TAG IDENTIFICATION Right 10/18/2020   Procedure: BREAST LUMPECTOMY WITH RADIOFREQUENCY TAG IDENTIFICATION;  Surgeon: Jules Husbands, MD;  Location: [facility] ORS;  Service: General;  Laterality: Right;   BREAST SURGERY Right 2002   LUMPECTOMY, radiation, chemotherapy   CATARACT EXTRACTION, BILATERAL     CHOLECYSTECTOMY N/A 12/10/2017   Procedure: LAPAROSCOPIC CHOLECYSTECTOMY;  Surgeon: Benjamine Sprague, DO;  Location: [facility] OR[facility];  Service: General;  Laterality: N/A;   COLON RESECTION Right 11/06/2018   Procedure: HAND ASSISTED LAPAROSCOPIC COLON RESECTION, RIGHT;  Surgeon: Jules Husbands, MD;  Location: [facility];  Service: General;  Laterality: Right;   COLONOSCOPY  2009,12/30/2013  COLONOSCOPY WITH PROPOFOL N/A 04/16/2018   Tubulovillous polyp proximal transverse colon, large sessile polyp, proximal transverse colon;  Surgeon: Robert Bellow, MD;  Location: [facility];  Service: Endoscopy;  Laterality: N/A;   ESOPHAGOGASTRODUODENOSCOPY (EGD) WITH PROPOFOL N/A 10/14/2018   Procedure: ESOPHAGOGASTRODUODENOSCOPY (EGD) WITH PROPOFOL;  Surgeon: Lucilla Lame, MD;  Location: [facility];  Service: Endoscopy;  Laterality: N/A;   TRACHEAL SURGERY   2013   TRACHEOSTOMY N/A    Dr Kathyrn Sheriff   XI ROBOTIC ASSISTED VENTRAL HERNIA N/A 10/18/2020   Procedure: XI ROBOTIC ASSISTED VENTRAL HERNIA;  Surgeon: Jules Husbands, MD;  Location: [facility];  Service: General;  Laterality: N/A;    Social History:   Social History   Socioeconomic History   Marital status: Married    Spouse name: [name]   Number of children: Not on file   Years of education: Not on file   Highest education level: Not on file  Occupational History   Not on file  Tobacco Use   Smoking status: Never   Smokeless tobacco: Never  Vaping Use   Vaping Use: Never used  Substance and Sexual Activity   Alcohol use: No   Drug use: No   Sexual activity: Not on file  Other Topics Concern   Not on file  Social History Narrative   Lives with husband   Right Handed   Drinks 2-3 cups caffeine daily   Social Determinants of Health   Financial Resource Strain: Not on file  Food Insecurity: Not on file  Transportation Needs: Not on file  Physical Activity: Not on file  Stress: Not on file  Social Connections: Not on file  Intimate Partner Violence: Not on file    Allergies:   Allergies  Allergen Reactions   Shellfish Allergy Shortness Of Breath    respitatory distress    Sodium Sulfite Shortness Of Breath    Severe Congestion that creates inability to breath   Sulfa Antibiotics Shortness Of Breath and Other (See Comments)    respitatory distress   Codeine Other (See Comments)    Per patient "it kept her up all night"   Glucerna [Alimentum] Itching   Wound Dressing Adhesive Rash    Family History:   Family History  Problem Relation Age of Onset   Cancer Father        bone cancer   Cancer Sister        lymphoma   Cancer Brother    Breast cancer Neg Hx      Current Medications:   Prior to Admission medications   Medication Sig Start Date End Date Taking? Authorizing Provider  acetaminophen (TYLENOL) 500 MG tablet Take 1,000 mg by mouth in the  morning and at bedtime.   Yes [provider]  amoxicillin-clavulanate (AUGMENTIN) 875-125 MG tablet Take 1 tablet by mouth in the morning and at bedtime. 02/18/21 02/25/21 Yes [provider]  busPIRone (BUSPAR) 15 MG tablet Place 15 mg into feeding tube 2 (two) times daily. 09/26/20  Yes [provider]  carboxymethylcellulose (REFRESH PLUS) 0.5 % SOLN Place 1 drop into both eyes 3 (three) times daily as needed (dry eyes).   Yes [provider]  denosumab (PROLIA) 60 MG/ML SOSY injection Inject 60 mg into the skin every 6 (six) months.   Yes [provider]  EPINEPHrine 0.3 mg/0.3 mL IJ SOAJ injection epinephrine 0.3 mg/0.3 mL injection, auto-injector  Inject 0.3 mLs (0.3 mg dose) into the muscle once as needed for Anaphylaxis for  up to 1 dose. 09/10/18  Yes [provider]  escitalopram (LEXAPRO) 10 MG tablet Place 3 tablets (30 mg total) into feeding tube daily. Patient taking differently: Place 10 mg into feeding tube daily. 02/13/20  Yes Kasa, Maretta Bees, MD  escitalopram (LEXAPRO) 20 MG tablet Place 20 mg into feeding tube daily.   Yes [provider]  folic acid (FOLVITE) 1 MG tablet Place 1 tablet (1 mg total) into feeding tube daily. 02/13/20  Yes Flora Lipps, MD  gabapentin (NEURONTIN) 600 MG tablet Place 600 mg into feeding tube 3 (three) times daily.   Yes [provider]  lovastatin (MEVACOR) 20 MG tablet Place 20 mg into feeding tube daily. 06/13/20  Yes [provider]  meclizine (ANTIVERT) 25 MG tablet Take 25 mg by mouth daily. 02/17/21  Yes [provider]  melatonin 5 MG TABS Place 1 tablet (5 mg total) into feeding tube at bedtime. 02/13/20  Yes Flora Lipps, MD  metFORMIN (GLUCOPHAGE) 1000 MG tablet Place 1,000 mg into feeding tube daily with breakfast. 06/10/20  Yes [provider]  metoprolol tartrate (LOPRESSOR) 25 MG tablet Place 25 mg into feeding tube daily. 06/13/20  Yes [provider]  Multiple Vitamins-Minerals (HAIR/SKIN/NAILS) TABS Take 1 tablet by mouth daily.   Yes [provider]  [facility] powder Apply topically. 01/18/21  Yes [provider]  pantoprazole (PROTONIX) 40 MG tablet Take 40 mg by mouth daily.   Yes [provider]  rOPINIRole (REQUIP) 0.5 MG tablet Place 1 tablet (0.5 mg total) into feeding tube at bedtime. 02/13/20  Yes Kasa, Maretta Bees, MD  sodium chloride 0.9 % nebulizer solution sodium chloride 0.9 % for nebulization  USE 5 ML BY NEBULIZTION AS NEEDED FOR WHEEZING 07/17/19  Yes [provider]     Physical Exam:   Vitals:   02/20/21 1407 02/20/21 1430 02/20/21 1604 02/20/21 1612  BP:  126/69    Pulse:  (!) 109  (!) 103  Resp:  (!) 24  20  Temp: 99 F (37.2 C)     TempSrc: Oral     SpO2:  98% 97% 94%  Weight:      Height:         General:  Appears calm and comfortable and is in NAD, currently on 5 L of blow-by oxygen via trach, tracheostomy in place with no signs of infection around stoma site Cardiovascular:  RRR, no m/r/g.  Respiratory:   CTA bilaterally with no wheezes/rales/rhonchi.  Normal respiratory effort. Abdomen:  soft, NT, ND, NABS, PEG tube in place with mild erythema around the stoma site Skin:  no rash or induration seen on limited exam Musculoskeletal:  grossly normal tone BUE/BLE, good ROM, no bony abnormality Lower extremity:  No LE edema.  Limited foot exam with no ulcerations.  2+ distal pulses. Psychiatric:  grossly normal mood and affect, speech fluent and appropriate, AOx3 Neurologic:  CN 2-12 grossly intact, moves all extremities in coordinated fashion, sensation intact    Data Review:    Radiological Exams on Admission: Independently reviewed - see discussion in A/P where applicable  DG Chest 2 View  Result Date: 02/20/2021 CLINICAL DATA:  Fever. EXAM: CHEST - 2 VIEW COMPARISON:  02/01/2020 FINDINGS: Tracheostomy tube again noted. Asymmetric elevation right  hemidiaphragm similar to prior. Interstitial markings are diffusely coarsened with chronic features. Stable right hilar prominence, likely vascular IMPRESSION: No active cardiopulmonary disease. Electronically Signed   By: Misty Stanley M.D.   On: 02/20/2021 09:49  CT ABDOMEN PELVIS W CONTRAST  Result Date: 02/20/2021 CLINICAL DATA:  Abdominal pain, painful PEG tube since Friday with drainage and tenderness, febrile; history breast cancer post lumpectomy, radiation therapy, chemotherapy, type II diabetes mellitus EXAM: CT ABDOMEN AND PELVIS WITH CONTRAST TECHNIQUE: Multidetector CT imaging of the abdomen and pelvis was performed using the standard protocol following bolus administration of intravenous contrast. CONTRAST:  126mL OMNIPAQUE IOHEXOL 300 MG/ML SOLN IV. No oral contrast. COMPARISON:  12/25/2018 FINDINGS: Lower chest: Dependent bibasilar atelectasis. Distended esophagus with air-fluid level. Hepatobiliary: Post cholecystectomy. Fatty infiltration of liver. No focal hepatic mass. Calcified granuloma LEFT lobe liver. Pancreas: Normal appearance Spleen: Normal appearance Adrenals/Urinary Tract: Adrenal glands, kidneys, ureters, and bladder normal appearance Stomach/Bowel: Normal appendix. Large and small bowel loops normal appearance. PEG tube balloon within stomach but the balloon and stomach appear partially retracted into the anterior abdominal wall. Questionable wall thickening of adjacent stomach versus artifact from underdistention. Vascular/Lymphatic: Atherosclerotic calcifications aorta and iliac arteries without aneurysm. Few normal sized retroperitoneal and mesenteric lymph nodes. No abdominal or pelvic adenopathy. Reproductive: Uterus surgically absent. Question atrophic LEFT ovary. Other: No free air or free fluid. Small supra umbilical hernia containing fat. Soft tissue thickening at the umbilicus and underlying anterior abdominal wall fascial planes. Small focal subcutaneous fluid  collection with enhancing rim LEFT periumbilical 2.4 x 1.5 x 1.2 cm, new, question abscess. Musculoskeletal: Osseous demineralization. IMPRESSION: PEG tube balloon within stomach but the balloon and stomach appear partially retracted into the anterior abdominal wall question hernia versus abdominal wall laxity. Questionable gastric wall thickening versus artifact from underdistention. Soft tissue thickening at the umbilicus and underlying anterior abdominal wall fascial planes with a small subcutaneous fluid collection with enhancing rim LEFT periumbilical 2.4 x 1.5 x 1.2 cm, question abscess. Distended esophagus with air-fluid level. Small supra umbilical hernia containing fat. Fatty infiltration of liver. Aortic Atherosclerosis (ICD10-I70.0). Electronically Signed   By: Lavonia Dana M.D.   On: 02/20/2021 14:12    EKG: Independently reviewed.  Sinus tachycardia, rate 111 bpm   Labs on Admission: I have personally reviewed the available labs and imaging studies at the time of the admission.  Pertinent labs on Admission: Sodium 134, blood glucose 217, AST 59, platelets 130, lactic acid 2.5     Assessment/Plan:    Acute hypoxic respiratory failure: Possible early pneumonia that has not developed on plain film imaging.  We will repeat chest x-ray in the morning.  Continue broad-spectrum antibiotics as noted below.  Start bronchodilators with DuoNeb scheduled and as needed.  Wean oxygen as tolerated.  PCCM consulted as the patient is chronically trach dependent and on ventilator at night.  Ventilator management per PCCM.  Currently on 5 L of blow-by oxygen.  We will obtain full RVP panel.  Discussed with Dr. Patsey Berthold and she stated although the patient is on vent at night that the patient did not need to be in the ICU and can be in the stepdown unit.  Concern for deep abdominal wall abscess: CT abdomen pelvis with contrast demonstrated possible periumbilical deep abdominal wall abscess.  Start  broad-spectrum antibiotics with vancomycin and cefepime.  General surgery consulted.  Sepsis secondary to above: Plan as above.  Meets sepsis criteria with tachycardia, leukocytosis and fevers.  Dyslipidemia: Continue home lovastatin  NIDDM: Type II.  Hold home Glucophage.  Start sliding scale insulin every 6 hours.  PEG tube dependence: Start trickle tube feeds.  Dietitian consulted.  N.p.o. after midnight for possible surgical intervention.  Hypertension?: Continue  home Lopressor  GERD: Continue home Protonix  RLS: Continue home Requip  Depression/anxiety: Continue home Lexapro, BuSpar  Diabetic neuropathy: Continue home gabapentin   Other information:    Level of Care: Stepdown DVT prophylaxis: Heparin subcu Code Status: Partial Consults: PCCM, general surgery Admission status: Inpatient   Leslee Home DO [facility]   How to contact the [facility] Attending or Consulting provider [place] or covering provider during after hours [place], for this patient?  Check the care team in [facility] and look for a) attending/consulting [facility] provider listed and b) the [facility] team listed Log into [email or web address] and use [name]'s universal password to access. If you do not have the password, please contact the hospital operator. Locate the [facility] provider you are looking for under [facility] and page to a number that you can be directly reached. If you still have difficulty reaching the provider, please page the [facility] (Director on Call) for the Hospitalists listed on amion for assistance.   02/20/2021, 6:25 PM

## 2021-02-20 NOTE — ED Provider Notes (Addendum)
[facility] Emergency Department Provider Note    Event Date/Time   First MD Initiated Contact with Patient 02/20/21 1343     (approximate)  I have reviewed the triage vital signs and the nursing notes.   HISTORY  Chief Complaint peg tube problem    HPI Sreshta Cressler is a 80 y.o. female extensive past medical history presents to the ER for evaluation of generalized malaise fever and chills also having some shortness of breath.  Patient found to be hypoxic to 80% on room air.  Does not wear home oxygen.  Denies any abdominal pain.  Was recently placed on Augmentin for possible area of cellulitis of PEG tube.  Area seems to be improving.  Does have a history of UTIs.  Has had some urinary frequency.  Past Medical History:  Diagnosis Date   Breast cancer, right ([place]) 2002   Tx'd with chemotherapy and XRT   Chronic respiratory failure ([place])    s/p tracheostomy in 2012; on ventilatory support at hs   Diabetic peripheral neuropathy ([place]) 08/28/2016   GERD (gastroesophageal reflux disease)    Glaucoma    High cholesterol    History of colon polyps    Osteoarthritis    Personal history of chemotherapy 2002   BREAST CA   Personal history of radiation therapy 2002   BREAST CA   S/P percutaneous endoscopic gastrostomy (PEG) tube placement (HCC)    T2DM (type 2 diabetes mellitus) ([place])    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   Ventral hernia    Vocal cord paralysis 2012   Family History  Problem Relation Age of Onset   Cancer Father        bone cancer   Cancer Sister        lymphoma   Cancer Brother    Breast cancer Neg Hx    Past Surgical History:  Procedure Laterality Date   ABDOMINAL HYSTERECTOMY     BREAST BIOPSY Right 2008   neg   BREAST BIOPSY Right 06/30/2020   "Q" clip-path pending   BREAST EXCISIONAL BIOPSY Right 2002   positive   BREAST LUMPECTOMY Right 2002   chemo and radiation   BREAST LUMPECTOMY WITH RADIOFREQUENCY TAG  IDENTIFICATION Right 10/18/2020   Procedure: BREAST LUMPECTOMY WITH RADIOFREQUENCY TAG IDENTIFICATION;  Surgeon: Jules Husbands, MD;  Location: [facility] ORS;  Service: General;  Laterality: Right;   BREAST SURGERY Right 2002   LUMPECTOMY, radiation, chemotherapy   CATARACT EXTRACTION, BILATERAL     CHOLECYSTECTOMY N/A 12/10/2017   Procedure: LAPAROSCOPIC CHOLECYSTECTOMY;  Surgeon: Benjamine Sprague, DO;  Location: [facility] ORS;  Service: General;  Laterality: N/A;   COLON RESECTION Right 11/06/2018   Procedure: HAND ASSISTED LAPAROSCOPIC COLON RESECTION, RIGHT;  Surgeon: Jules Husbands, MD;  Location: [facility] ORS;  Service: General;  Laterality: Right;   COLONOSCOPY  2009,12/30/2013   COLONOSCOPY WITH PROPOFOL N/A 04/16/2018   Tubulovillous polyp proximal transverse colon, large sessile polyp, proximal transverse colon;  Surgeon: Robert Bellow, MD;  Location: [facility] ENDOSCOPY;  Service: Endoscopy;  Laterality: N/A;   ESOPHAGOGASTRODUODENOSCOPY (EGD) WITH PROPOFOL N/A 10/14/2018   Procedure: ESOPHAGOGASTRODUODENOSCOPY (EGD) WITH PROPOFOL;  Surgeon: Lucilla Lame, MD;  Location: [facility] ENDOSCOPY;  Service: Endoscopy;  Laterality: N/A;   TRACHEAL SURGERY  2013   TRACHEOSTOMY N/A    Dr Kathyrn Sheriff   XI ROBOTIC ASSISTED VENTRAL HERNIA N/A 10/18/2020   Procedure: XI ROBOTIC ASSISTED VENTRAL HERNIA;  Surgeon: Jules Husbands, MD;  Location: [facility] ORS;  Service:  General;  Laterality: N/A;   Patient Active Problem List   Diagnosis Date Noted   Chronic respiratory failure with hypoxia ([place]) 03/29/2020   Chronic diastolic CHF (congestive heart failure) ([place]) 01/31/2020   Uncontrolled type 2 diabetes mellitus with hyperglycemia ([place]) 01/31/2020   AF (paroxysmal atrial fibrillation) ([place]) 01/31/2020   Aspiration pneumonia of both lower lobes due to gastric secretions ([place]) 99/83/3825   Acute metabolic encephalopathy 05/39/7673   Acute respiratory failure ([place]) 01/23/2020   Left lower lobe pneumonia 01/16/2020    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/05/2019   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   Weakness    Hypoxia    Acute respiratory failure with hypoxia and hypercapnia ([place]) 03/21/2019   Hypotension 03/21/2019   Acute encephalopathy 03/21/2019   Multiple fractures of ribs, right side, initial encounter for closed fracture 12/25/2018   Colon polyp 11/06/2018   Esophageal dysphagia    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    Stricture and stenosis of esophagus    Elevated CEA 09/02/2018   Goals of care, counseling/discussion 09/02/2018   Polyp of transverse colon 09/02/2018   Foreign body in right foot 05/26/2018   History of tracheostomy 04/30/2018   Moderate episode of recurrent major depressive disorder ([place]) 04/03/2018   Poor balance 04/03/2018   Shoulder lesion, right 02/28/2017   Cervical radiculopathy 09/28/2016   Peripheral neuropathy 08/28/2016   Diabetic peripheral neuropathy ([place]) 08/28/2016   Leg weakness, bilateral 08/13/2016   Gait abnormality 08/13/2016   Carcinoma of overlapping sites of right breast in female, estrogen receptor negative ([place]) 12/27/2015   Malignant neoplasm of breast ([place]) 03/28/2015   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   Hypercholesterolemia 03/28/2015   Recurrent major depressive disorder, in partial remission ([place]) 03/01/2015   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   Cephalalgia 09/15/2014   Headache due to trauma 09/15/2014   Osteopenia 01/07/2014   Age-related osteoporosis without current pathological fracture 01/07/2014   History of colonic polyps 12/14/2013   History of breast cancer 11/26/2012   H/O malignant neoplasm of breast 11/26/2012   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 Authorizing Provider  acetaminophen (TYLENOL) 500 MG tablet Take 1,000 mg by mouth in the morning and at bedtime.   Yes [provider]  amoxicillin-clavulanate (AUGMENTIN) 875-125 MG tablet Take 1 tablet by mouth in the morning and at bedtime. 02/18/21 02/25/21 Yes [provider]  busPIRone (BUSPAR) 15 MG tablet Place 15 mg into feeding tube 2 (two) times daily. 09/26/20  Yes [provider]  carboxymethylcellulose (REFRESH PLUS) 0.5 % SOLN Place 1 drop into both eyes 3 (three) times daily as needed (dry eyes).   Yes [provider]  denosumab (PROLIA) 60 MG/ML SOSY injection Inject 60 mg into the skin every 6 (six) months.   Yes [provider]  EPINEPHrine 0.3 mg/0.3 mL IJ SOAJ injection epinephrine 0.3 mg/0.3 mL injection, auto-injector  Inject 0.3 mLs (0.3 mg dose) into the muscle once as needed for Anaphylaxis for up to 1 dose. 09/10/18  Yes [provider]  escitalopram (LEXAPRO) 10 MG tablet Place 3 tablets (30 mg total)  into feeding tube daily. Patient taking differently: Place 10 mg into feeding tube daily. 02/13/20  Yes Kasa, Maretta Bees, MD  escitalopram (LEXAPRO) 20 MG tablet Place 20 mg into feeding tube daily.   Yes [provider]  folic acid (FOLVITE) 1 MG tablet Place 1 tablet (1 mg total) into feeding tube daily. 02/13/20  Yes Flora Lipps, MD  gabapentin (NEURONTIN) 600 MG tablet Place 600 mg into feeding tube 3 (three) times daily.   Yes [provider]  lovastatin (MEVACOR) 20 MG tablet Place 20 mg into feeding tube daily. 06/13/20  Yes [provider]  meclizine (ANTIVERT) 25 MG tablet Take 25 mg by mouth daily. 02/17/21  Yes [provider]  melatonin 5 MG TABS Place 1 tablet (5 mg total) into feeding tube at bedtime.  02/13/20  Yes Flora Lipps, MD  metFORMIN (GLUCOPHAGE) 1000 MG tablet Place 1,000 mg into feeding tube daily with breakfast. 06/10/20  Yes [provider]  metoprolol tartrate (LOPRESSOR) 25 MG tablet Place 25 mg into feeding tube daily. 06/13/20  Yes [provider]  Multiple Vitamins-Minerals (HAIR/SKIN/NAILS) TABS Take 1 tablet by mouth daily.   Yes [provider]  [facility] powder Apply topically. 01/18/21  Yes [provider]  pantoprazole (PROTONIX) 40 MG tablet Take 40 mg by mouth daily.   Yes [provider]  rOPINIRole (REQUIP) 0.5 MG tablet Place 1 tablet (0.5 mg total) into feeding tube at bedtime. 02/13/20  Yes Kasa, Maretta Bees, MD  sodium chloride 0.9 % nebulizer solution sodium chloride 0.9 % for nebulization  USE 5 ML BY NEBULIZTION AS NEEDED FOR WHEEZING 07/17/19  Yes [provider]  fluconazole (DIFLUCAN) 150 MG tablet Place 150 mg into feeding tube once a week. Patient not taking: Reported on 02/20/2021 10/07/20   [provider]  HYDROcodone-acetaminophen (NORCO/VICODIN) 5-325 MG tablet Take 1-2 tablets by mouth every 6 (six) hours as needed for moderate pain. Patient not taking: Reported on 02/20/2021 10/18/20   Jules Husbands, MD    Allergies Shellfish allergy, Sodium sulfite, Sulfa antibiotics, Codeine, Glucerna [alimentum], and Wound dressing adhesive    Social History Social History   Tobacco Use   Smoking status: Never   Smokeless tobacco: Never  Vaping Use   Vaping Use: Never used  Substance Use Topics   Alcohol use: No   Drug use: No    Review of Systems Patient denies headaches, rhinorrhea, blurry vision, numbness, shortness of breath, chest pain, edema, cough, abdominal pain, nausea, vomiting, diarrhea, dysuria, fevers, rashes or hallucinations unless otherwise stated above in HPI. ____________________________________________   PHYSICAL EXAM:  VITAL SIGNS: Vitals:   02/20/21 1407 02/20/21 1430   BP:  126/69  Pulse:  (!) 109  Resp:  (!) 24  Temp: 99 F (37.2 C)   SpO2:  98%    Constitutional: Alert and oriented.  Eyes: Conjunctivae are normal.  Head: Atraumatic. Nose: No congestion/rhinnorhea. Mouth/Throat: Mucous membranes are moist.   Neck: No stridor. Painless ROM.  Cardiovascular: Normal rate, regular rhythm. Grossly normal heart sounds.  Good peripheral circulation. Respiratory: Normal respiratory effort.  No retractions. Lungs CTAB. Gastrointestinal: Soft, no significant cellulitic changes around PEG tube.  Does have some mild tenderness in periumbilical area with small area of fluctuance but no overlying erythema or warmth.  No distention. No abdominal bruits. No CVA tenderness. Genitourinary:  Musculoskeletal: No lower extremity tenderness nor edema.  No joint effusions. Neurologic:  Normal speech and language. No gross focal neurologic deficits are appreciated. No facial  droop Skin:  Skin is warm, dry and intact. No rash noted. Psychiatric: Mood and affect are normal. Speech and behavior are normal.  ____________________________________________   LABS (all labs ordered are listed, but only abnormal results are displayed)  Results for orders placed or performed during the hospital encounter of 02/20/21 (from the past 24 hour(s))  Lactic acid, plasma     Status: Abnormal   Collection Time: 02/20/21 11:37 AM  Result Value Ref Range   Lactic Acid, Venous 2.5 (HH) 0.5 - 1.9 mmol/L  Comprehensive metabolic panel     Status: Abnormal   Collection Time: 02/20/21 11:37 AM  Result Value Ref Range   Sodium 134 (L) 135 - 145 mmol/L   Potassium 3.9 3.5 - 5.1 mmol/L   Chloride 102 98 - 111 mmol/L   CO2 22 22 - 32 mmol/L   Glucose, Bld 217 (H) 70 - 99 mg/dL   BUN 18 8 - 23 mg/dL   Creatinine, Ser 0.94 0.44 - 1.00 mg/dL   Calcium 9.2 8.9 - 10.3 mg/dL   Total Protein 7.7 6.5 - 8.1 g/dL   Albumin 3.9 3.5 - 5.0 g/dL   AST 59 (H) 15 - 41 U/L   ALT 41 0 - 44 U/L    Alkaline Phosphatase 69 38 - 126 U/L   Total Bilirubin 0.9 0.3 - 1.2 mg/dL   GFR, Estimated >60 >60 mL/min   Anion gap 10 5 - 15  CBC with Differential     Status: Abnormal   Collection Time: 02/20/21 11:37 AM  Result Value Ref Range   WBC 10.0 4.0 - 10.5 K/uL   RBC 4.58 3.87 - 5.11 MIL/uL   Hemoglobin 12.5 12.0 - 15.0 g/dL   HCT 38.8 36.0 - 46.0 %   MCV 84.7 80.0 - 100.0 fL   MCH 27.3 26.0 - 34.0 pg   MCHC 32.2 30.0 - 36.0 g/dL   RDW 14.1 11.5 - 15.5 %   Platelets 130 (L) 150 - 400 K/uL   nRBC 0.0 0.0 - 0.2 %   Neutrophils Relative % 89 %   Neutro Abs 8.9 (H) 1.7 - 7.7 K/uL   Lymphocytes Relative 4 %   Lymphs Abs 0.4 (L) 0.7 - 4.0 K/uL   Monocytes Relative 6 %   Monocytes Absolute 0.6 0.1 - 1.0 K/uL   Eosinophils Relative 0 %   Eosinophils Absolute 0.0 0.0 - 0.5 K/uL   Basophils Relative 0 %   Basophils Absolute 0.0 0.0 - 0.1 K/uL   Immature Granulocytes 1 %   Abs Immature Granulocytes 0.06 0.00 - 0.07 K/uL  Resp Panel by RT-PCR (Flu A&B, Covid)     Status: None   Collection Time: 02/20/21  2:23 PM   Specimen: Nasopharyngeal(NP) swabs in vial transport medium  Result Value Ref Range   SARS Coronavirus 2 by RT PCR NEGATIVE NEGATIVE   Influenza A by PCR NEGATIVE NEGATIVE   Influenza B by PCR NEGATIVE NEGATIVE  Protime-INR     Status: None   Collection Time: 02/20/21  2:23 PM  Result Value Ref Range   Prothrombin Time 14.5 11.4 - 15.2 seconds   INR 1.1 0.8 - 1.2  APTT     Status: None   Collection Time: 02/20/21  2:23 PM  Result Value Ref Range   aPTT 31 24 - 36 seconds  Urinalysis, Complete w Microscopic     Status: Abnormal   Collection Time: 02/20/21  2:23 PM  Result Value Ref Range   Color,  Urine YELLOW YELLOW   APPearance CLEAR CLEAR   Specific Gravity, Urine >1.030 (H) 1.005 - 1.030   pH 5.5 5.0 - 8.0   Glucose, UA NEGATIVE NEGATIVE mg/dL   Hgb urine dipstick NEGATIVE NEGATIVE   Bilirubin Urine NEGATIVE NEGATIVE   Ketones, ur 15 (A) NEGATIVE mg/dL    Protein, ur 30 (A) NEGATIVE mg/dL   Nitrite NEGATIVE NEGATIVE   Leukocytes,Ua NEGATIVE NEGATIVE   Squamous Epithelial / LPF 0-5 0 - 5   WBC, UA 0-5 0 - 5 WBC/hpf   RBC / HPF 0-5 0 - 5 RBC/hpf   Bacteria, UA FEW (A) NONE SEEN   ____________________________________________  EKG My review and personal interpretation at Time: 14:24   Indication: sepsis  Rate: 110  Rhythm: sinus Axis: normal Other: nonspecific st abn, no stemi, prolonged qt ____________________________________________  RADIOLOGY  I personally reviewed all radiographic images ordered to evaluate for the above acute complaints and reviewed radiology reports and findings.  These findings were personally discussed with the patient.  Please see medical record for radiology report.  ____________________________________________   PROCEDURES  Procedure(s) performed:  .Critical Care Performed by: Merlyn Lot, MD Authorized by: Merlyn Lot, MD   Critical care provider statement:    Critical care time (minutes):  40   Critical care was necessary to treat or prevent imminent or life-threatening deterioration of the following conditions:  Respiratory failure and sepsis   Critical care was time spent personally by me on the following activities:  Ordering and performing treatments and interventions, ordering and review of laboratory studies, ordering and review of radiographic studies, pulse oximetry, re-evaluation of patient's condition, review of old charts, obtaining history from patient or surrogate, examination of patient, evaluation of patient's response to treatment, discussions with primary provider, discussions with consultants and development of treatment plan with patient or surrogate    Critical Care performed: yes ____________________________________________   INITIAL IMPRESSION / [place] / ED COURSE  Pertinent labs & imaging results that were available during my care of the patient were  reviewed by me and considered in my medical decision making (see chart for details).   DDX: sepsis, pna, flu, covid, abscess, uti, pyelo, bacteremia  Oyindamola Key is a 80 y.o. who presents to the ED with presentation as described above.  Patient mildly tachycardic mildly tachypneic hypoxic.  Mild abdominal pain given recent treatment for cellulitis will order CT imaging.  No peritonitis.   Presentation complicated she is recently put on Augmentin here with findings concerning for sepsis.  CT imaging shows no intra-abdominal acute process.  Noted some area of enhancement in periumbilical region corresponding with possible deep abscess.  Does not have overlying erythema however.  Will consult surgery.  She does have new O2 requirement is possibly developing pneumonia but no infiltrates on chest x-ray.  Given her presentation do feel she will require hospitalization.  Will discuss with hospitalist.     The patient was evaluated in Emergency Department today for the symptoms described in the history of present illness. He/she was evaluated in the context of the global COVID-19 pandemic, which necessitated consideration that the patient might be at risk for infection with the SARS-CoV-2 virus that causes COVID-19. Institutional protocols and algorithms that pertain to the evaluation of patients at risk for COVID-19 are in a state of rapid change based on information released by regulatory bodies including the CDC and federal and state organizations. These policies and algorithms were followed during the patient's care in the  ED.  As part of my medical decision making, I reviewed the following data within the [place] notes reviewed and incorporated, Labs reviewed, notes from prior ED visits and [place] Controlled Substance Database   ____________________________________________   FINAL CLINICAL IMPRESSION(S) / ED DIAGNOSES  Final diagnoses:  Sepsis with acute hypoxic  respiratory failure, due to unspecified organism, unspecified whether septic shock present ([place])      NEW MEDICATIONS STARTED DURING THIS VISIT:  New Prescriptions   No medications on file     Note:  This document was prepared using Dragon voice recognition software and may include unintentional dictation errors.     Merlyn Lot, MD 02/20/21 1600

## 2021-02-21 ENCOUNTER — Inpatient Hospital Stay: Payer: Medicare Other

## 2021-02-21 DIAGNOSIS — A419 Sepsis, unspecified organism: Secondary | ICD-10-CM | POA: Diagnosis not present

## 2021-02-21 DIAGNOSIS — R400 Somnolence: Secondary | ICD-10-CM | POA: Diagnosis not present

## 2021-02-21 DIAGNOSIS — R6520 Severe sepsis without septic shock: Secondary | ICD-10-CM

## 2021-02-21 DIAGNOSIS — J810 Acute pulmonary edema: Secondary | ICD-10-CM | POA: Diagnosis not present

## 2021-02-21 DIAGNOSIS — J9601 Acute respiratory failure with hypoxia: Secondary | ICD-10-CM

## 2021-02-21 LAB — RESPIRATORY PANEL BY PCR

## 2021-02-21 LAB — CBC
HCT: 34.8 % — ABNORMAL LOW (ref 36.0–46.0)
Hemoglobin: 11.1 g/dL — ABNORMAL LOW (ref 12.0–15.0)
MCH: 27 pg (ref 26.0–34.0)
MCHC: 31.9 g/dL (ref 30.0–36.0)
MCV: 84.7 fL (ref 80.0–100.0)
Platelets: 113 10*3/uL — ABNORMAL LOW (ref 150–400)
RBC: 4.11 MIL/uL (ref 3.87–5.11)
RDW: 14.4 % (ref 11.5–15.5)
WBC: 6.8 10*3/uL (ref 4.0–10.5)
nRBC: 0 % (ref 0.0–0.2)

## 2021-02-21 LAB — BASIC METABOLIC PANEL
Anion gap: 8 (ref 5–15)
BUN: 14 mg/dL (ref 8–23)
CO2: 23 mmol/L (ref 22–32)
Calcium: 8.3 mg/dL — ABNORMAL LOW (ref 8.9–10.3)
Chloride: 103 mmol/L (ref 98–111)
Creatinine, Ser: 0.84 mg/dL (ref 0.44–1.00)
GFR, Estimated: >60 mL/min (ref >60)
Glucose, Bld: 251 mg/dL — ABNORMAL HIGH (ref 70–99)
Potassium: 3.4 mmol/L — ABNORMAL LOW (ref 3.5–5.1)
Sodium: 134 mmol/L — ABNORMAL LOW (ref 135–145)

## 2021-02-21 LAB — GLUCOSE, CAPILLARY
Glucose-Capillary: 125 mg/dL — ABNORMAL HIGH (ref 70–99)
Glucose-Capillary: 145 mg/dL — ABNORMAL HIGH (ref 70–99)
Glucose-Capillary: 146 mg/dL — ABNORMAL HIGH (ref 70–99)
Glucose-Capillary: 170 mg/dL — ABNORMAL HIGH (ref 70–99)
Glucose-Capillary: 187 mg/dL — ABNORMAL HIGH (ref 70–99)
Glucose-Capillary: 205 mg/dL — ABNORMAL HIGH (ref 70–99)
Glucose-Capillary: 237 mg/dL — ABNORMAL HIGH (ref 70–99)

## 2021-02-21 LAB — STREP PNEUMONIAE URINARY ANTIGEN: Strep Pneumo Urinary Antigen: NEGATIVE

## 2021-02-21 LAB — MRSA NEXT GEN BY PCR, NASAL: MRSA by PCR Next Gen: NOT DETECTED

## 2021-02-21 LAB — LACTIC ACID, PLASMA
Lactic Acid, Venous: 2.6 mmol/L (ref 0.5–1.9)
Lactic Acid, Venous: 3.2 mmol/L (ref 0.5–1.9)

## 2021-02-21 LAB — PROCALCITONIN: Procalcitonin: 0.17 ng/mL

## 2021-02-21 MED ORDER — VANCOMYCIN HCL 1250 MG/250ML IV SOLN
1250.0000 mg | INTRAVENOUS | Status: DC
  Administered 2021-02-21: 21:00:00 1250 mg via INTRAVENOUS
  Filled 2021-02-21 (×2): qty 250

## 2021-02-21 MED ORDER — INSULIN ASPART 100 UNIT/ML IJ SOLN
0.0000 [IU] | INTRAMUSCULAR | Status: DC
  Administered 2021-02-21: 20:00:00 3 [IU] via SUBCUTANEOUS
  Administered 2021-02-21: 2 [IU] via SUBCUTANEOUS
  Administered 2021-02-21: 16:00:00 3 [IU] via SUBCUTANEOUS
  Administered 2021-02-22 (×2): 15 [IU] via SUBCUTANEOUS
  Administered 2021-02-22: 17:00:00 5 [IU] via SUBCUTANEOUS
  Administered 2021-02-22: 12:00:00 11 [IU] via SUBCUTANEOUS
  Administered 2021-02-22: 09:00:00 5 [IU] via SUBCUTANEOUS
  Administered 2021-02-22: 03:00:00 2 [IU] via SUBCUTANEOUS
  Administered 2021-02-23: 13:00:00 3 [IU] via SUBCUTANEOUS
  Administered 2021-02-23: 2 [IU] via SUBCUTANEOUS
  Administered 2021-02-23: 05:00:00 11 [IU] via SUBCUTANEOUS
  Administered 2021-02-23 (×2): 3 [IU] via SUBCUTANEOUS
  Administered 2021-02-23: 08:00:00 5 [IU] via SUBCUTANEOUS
  Administered 2021-02-24: 04:00:00 2 [IU] via SUBCUTANEOUS
  Administered 2021-02-24: 19:00:00 11 [IU] via SUBCUTANEOUS
  Administered 2021-02-24 (×2): 8 [IU] via SUBCUTANEOUS
  Administered 2021-02-24: 16:00:00 2 [IU] via SUBCUTANEOUS
  Administered 2021-02-24: 08:00:00 3 [IU] via SUBCUTANEOUS
  Administered 2021-02-25: 5 [IU] via SUBCUTANEOUS
  Administered 2021-02-25 (×3): 3 [IU] via SUBCUTANEOUS
  Administered 2021-02-25: 2 [IU] via SUBCUTANEOUS
  Administered 2021-02-26: 3 [IU] via SUBCUTANEOUS
  Administered 2021-02-26: 2 [IU] via SUBCUTANEOUS
  Administered 2021-02-26 (×3): 3 [IU] via SUBCUTANEOUS
  Administered 2021-02-26: 2 [IU] via SUBCUTANEOUS
  Administered 2021-02-27 – 2021-02-28 (×10): 3 [IU] via SUBCUTANEOUS
  Administered 2021-03-01: 11 [IU] via SUBCUTANEOUS
  Administered 2021-03-01: 3 [IU] via SUBCUTANEOUS
  Administered 2021-03-01: 5 [IU] via SUBCUTANEOUS
  Administered 2021-03-01: 2 [IU] via SUBCUTANEOUS
  Administered 2021-03-01: 5 [IU] via SUBCUTANEOUS
  Administered 2021-03-01: 8 [IU] via SUBCUTANEOUS
  Administered 2021-03-02 (×5): 5 [IU] via SUBCUTANEOUS
  Administered 2021-03-02 – 2021-03-03 (×3): 3 [IU] via SUBCUTANEOUS
  Administered 2021-03-03: 5 [IU] via SUBCUTANEOUS
  Administered 2021-03-03: 3 [IU] via SUBCUTANEOUS
  Administered 2021-03-03: 5 [IU] via SUBCUTANEOUS
  Administered 2021-03-03: 8 [IU] via SUBCUTANEOUS
  Filled 2021-02-21 (×53): qty 1

## 2021-02-21 MED ORDER — PANTOPRAZOLE 2 MG/ML SUSPENSION
40.0000 mg | Freq: Every day | ORAL | Status: DC
  Administered 2021-02-21 – 2021-03-06 (×14): 40 mg
  Filled 2021-02-21 (×14): qty 20

## 2021-02-21 MED ORDER — POTASSIUM CHLORIDE 20 MEQ PO PACK
20.0000 meq | PACK | Freq: Once | ORAL | Status: AC
  Administered 2021-02-21: 14:00:00 20 meq
  Filled 2021-02-21: qty 1

## 2021-02-21 MED ORDER — FREE WATER
100.0000 mL | Freq: Four times a day (QID) | Status: DC
Start: 2021-02-21 — End: 2021-02-25
  Administered 2021-02-21 – 2021-02-24 (×13): 100 mL

## 2021-02-21 MED ORDER — CHLORHEXIDINE GLUCONATE CLOTH 2 % EX PADS
6.0000 | MEDICATED_PAD | Freq: Every day | CUTANEOUS | Status: DC
  Administered 2021-02-21 – 2021-02-25 (×4): 6 via TOPICAL

## 2021-02-21 MED ORDER — MECLIZINE HCL 25 MG PO TABS
25.0000 mg | ORAL_TABLET | Freq: Every day | ORAL | Status: DC
  Administered 2021-02-21 – 2021-02-22 (×2): 25 mg
  Filled 2021-02-21 (×2): qty 1

## 2021-02-21 MED ORDER — KATE FARMS STANDARD 1.4 PO LIQD
325.0000 mL | Freq: Four times a day (QID) | ORAL | Status: DC
  Administered 2021-02-21 – 2021-02-24 (×12): 325 mL

## 2021-02-21 MED ORDER — ALPRAZOLAM 0.25 MG PO TABS
0.2500 mg | ORAL_TABLET | Freq: Three times a day (TID) | ORAL | Status: DC | PRN
  Administered 2021-02-21 – 2021-02-22 (×4): 0.25 mg
  Filled 2021-02-21 (×5): qty 1

## 2021-02-21 MED ORDER — ORAL CARE MOUTH RINSE
15.0000 mL | Freq: Two times a day (BID) | OROMUCOSAL | Status: DC
  Administered 2021-02-21 – 2021-02-24 (×8): 15 mL via OROMUCOSAL

## 2021-02-21 MED ORDER — ACETAMINOPHEN 10 MG/ML IV SOLN
1000.0000 mg | Freq: Four times a day (QID) | INTRAVENOUS | Status: AC
  Administered 2021-02-21 (×3): 1000 mg via INTRAVENOUS
  Filled 2021-02-21 (×3): qty 100

## 2021-02-21 MED ORDER — PRAVASTATIN SODIUM 20 MG PO TABS
20.0000 mg | ORAL_TABLET | Freq: Every day | ORAL | Status: DC
  Administered 2021-02-21 – 2021-03-06 (×14): 20 mg
  Filled 2021-02-21 (×13): qty 1

## 2021-02-21 NOTE — Progress Notes (Signed)
Initial Nutrition Assessment  DOCUMENTATION CODES:   Not applicable  INTERVENTION:   [employer] [place] Standard 1.4 formula- Provide 1 carton 4 times daily via tube- Flush with 67m of water before and after each feed.   Regimen provides 1820kcal/day, 80g/day protein and 13349mday free water   NUTRITION DIAGNOSIS:   Inadequate oral intake related to dysphagia as evidenced by NPO status (pt with chronic G-tube).  GOAL:   Patient will meet greater than or equal to 90% of their needs  MONITOR:   Labs, Weight trends, Skin, I & O's, TF tolerance  REASON FOR ASSESSMENT:   Consult Enteral/tube feeding initiation and management  ASSESSMENT:   Angela David with PMHx of breast cancer s/p lumpectomy and chemo/XRT, DM, diabetic peripheral neuropathy, GERD, vocal cord paralysis, tracheal stenosis requiring chronic tracheostomy, aspiration PNA, chronic G-tube (placed surgically 01/29/2020), s/p hand-assisted laparoscopic right hemicolectomy by Dr. PaDahlia Byesn 11/06/2018 and s/p right partial mastectomy and robotic assisted incisional ventral hernia repair on 10/18/2020 who is now admitted with sepsis and respiratory distress.  Met with pt in room today. Pt is familiar to this RD from previous admissions. Pt currently on trach collar. Pt reports that she is feeling ok today. Pt with chronic G-tube and reports that everything has been going well with her tube feeds at home. Pt reports that she was unable to tolerate Jevity or Glucerna r/t diarrhea and reports that she has been doing the [employer] standard formula at home. Pt reports that she gives herself 3 cartons per day via tube and that she eats some food by mouth but reports that this is not a substantial amount. Pt currently receiving Jevity at trickle rate of 1056mr. RD will change pt back to her home tube feed regimen; will increase to four times daily in hospital as pt is NPO and not taking in anything by mouth currently. Per chart, pt with  weight gain since her last admission in August.   Medications reviewed and include: folic acid, heparin, insulin, melatonin, protonix, cefepime, vancomycin   Labs reviewed: Na 134(L), K 3.4(L) Cbgs- 146, 205, 237 x 24 hrs AIC 6.3(H)- 12/2019  NUTRITION - FOCUSED PHYSICAL EXAM:  Flowsheet Row Most Recent Value  Orbital Region No depletion  Upper Arm Region No depletion  Thoracic and Lumbar Region No depletion  Buccal Region No depletion  Temple Region No depletion  Clavicle Bone Region Mild depletion  Clavicle and Acromion Bone Region Mild depletion  Scapular Bone Region No depletion  Dorsal Hand No depletion  Patellar Region No depletion  Anterior Thigh Region No depletion  Posterior Calf Region No depletion  Edema (RD Assessment) None  Hair Reviewed  Eyes Reviewed  Mouth Reviewed  Skin Reviewed  Nails Reviewed   Diet Order:   Diet Order             Diet NPO time specified  Diet effective midnight                  EDUCATION NEEDS:   Education needs have been addressed  Skin:  Skin Assessment: Reviewed RN Assessment (ecchymosis)  Last BM:  pta  Height:   Ht Readings from Last 1 Encounters:  02/20/21 '5\' 4"'  (1.626 m)    Weight:   Wt Readings from Last 1 Encounters:  02/21/21 76.7 kg    Ideal Body Weight:  54.5 kg  BMI:  Body mass index is 29.02 kg/m.  Estimated Nutritional Needs:   Kcal:  1700-1900kcal/day  Protein:  85-95g/day  Fluid:  1.4-1.6L/day  Koleen Distance MS, RD, LDN Please refer to [facility] for RD and/or RD on-call/weekend/after hours pager

## 2021-02-21 NOTE — Consult Note (Signed)
PHARMACY CONSULT NOTE - FOLLOW UP  Pharmacy Consult for Electrolyte Monitoring and Replacement   Recent Labs: Potassium (mmol/L)  Date Value  02/21/2021 3.4 (L)  12/17/2011 4.1   Magnesium (mg/dL)  Date Value  02/13/2020 2.4  12/17/2011 2.2   Calcium (mg/dL)  Date Value  02/21/2021 8.3 (L)   Calcium, Total (mg/dL)  Date Value  12/17/2011 8.8   Albumin (g/dL)  Date Value  02/20/2021 3.9  08/28/2016 4.3  12/16/2011 4.1   Phosphorus (mg/dL)  Date Value  02/13/2020 3.8  12/16/2011 2.2 (L)   Sodium (mmol/L)  Date Value  02/21/2021 134 (L)  08/28/2016 143  12/17/2011 139     Assessment: 80 y.o with significant PMH of remote breast cancer in 2002 s/p chemo and radiation, DM2, ventral hernia s/p robotic incisional hernia repair and right upper outer quadrant mass s/p RFA guided right lumpectomy (09/2020), peripheral neuropathy, GERD, 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 Pharmacy has been consulted for electrolyte monitoring and replacement.   Nutrition [employer] standard TF 380mL QID Free water 146mL QID  Labs Na 134>134 K 3.9>3.4  Goal of Therapy:  Electrolytes WNL  Plan:  --Will order 80mEq Kcl packet x1 --No other replacement is currently indicated  --Will continue to monitor and replace electrolytes as clinically indicated   Narda Rutherford, PharmD Pharmacy Resident  02/21/2021 12:36 PM

## 2021-02-21 NOTE — ED Notes (Signed)
ED TO INPATIENT HANDOFF REPORT  ED Nurse Name and Phone #:   S Name/Age/Gender Angela David 80 y.o. female Room/Bed: ED13A/ED13A  Code Status   Code Status: Partial Code  Home/SNF/Other Home Patient oriented to: self, place, time, and situation Is this baseline? Yes   Triage Complete: Triage complete  Chief Complaint Sepsis [facility]) [A41.9]  Triage Note Pt comes into the ED via EMS from home with c/o pain and drainage around her peg tube since Friday. States it is still working properly just very painful and irritated.   Allergies Allergies  Allergen Reactions   Shellfish Allergy Shortness Of Breath    respitatory distress    Sodium Sulfite Shortness Of Breath    Severe Congestion that creates inability to breath   Sulfa Antibiotics Shortness Of Breath and Other (See Comments)    respitatory distress   Codeine Other (See Comments)    Per patient "it kept her up all night"   Glucerna [Alimentum] Itching   Wound Dressing Adhesive Rash    Level of Care/Admitting Diagnosis ED Disposition     ED Disposition  Admit   Condition  --   [place]: [place] [[id number]]  Level of Care: Stepdown [14]  Covid Evaluation: Confirmed COVID Negative  Diagnosis: Sepsis [facility]) [[id number]]  Admitting Physician: Leslee Home [[id number]]  Attending Physician: Leslee Home [[id number]]  Estimated length of stay: 3 - 4 days  Certification:: I certify this patient will need inpatient services for at least 2 midnights          B Medical/Surgery History Past Medical History:  Diagnosis Date   Breast cancer, right ([place]) 2002   Tx'd with chemotherapy and XRT   Chronic respiratory failure ([place])    s/p tracheostomy in 2012; on ventilatory support at hs   Diabetic peripheral neuropathy ([place]) 08/28/2016   GERD (gastroesophageal reflux disease)    Glaucoma    High cholesterol    History of colon polyps    Osteoarthritis    Personal history  of chemotherapy 2002   BREAST CA   Personal history of radiation therapy 2002   BREAST CA   S/P percutaneous endoscopic gastrostomy (PEG) tube placement (HCC)    T2DM (type 2 diabetes mellitus) ([place])    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   Ventral hernia    Vocal cord paralysis 2012   Past Surgical History:  Procedure Laterality Date   ABDOMINAL HYSTERECTOMY     BREAST BIOPSY Right 2008   neg   BREAST BIOPSY Right 06/30/2020   "Q" clip-path pending   BREAST EXCISIONAL BIOPSY Right 2002   positive   BREAST LUMPECTOMY Right 2002   chemo and radiation   BREAST LUMPECTOMY WITH RADIOFREQUENCY TAG IDENTIFICATION Right 10/18/2020   Procedure: BREAST LUMPECTOMY WITH RADIOFREQUENCY TAG IDENTIFICATION;  Surgeon: Jules Husbands, MD;  Location: [facility] ORS;  Service: General;  Laterality: Right;   BREAST SURGERY Right 2002   LUMPECTOMY, radiation, chemotherapy   CATARACT EXTRACTION, BILATERAL     CHOLECYSTECTOMY N/A 12/10/2017   Procedure: LAPAROSCOPIC CHOLECYSTECTOMY;  Surgeon: Benjamine Sprague, DO;  Location: [facility] ORS;  Service: General;  Laterality: N/A;   COLON RESECTION Right 11/06/2018   Procedure: HAND ASSISTED LAPAROSCOPIC COLON RESECTION, RIGHT;  Surgeon: Jules Husbands, MD;  Location: [facility] ORS;  Service: General;  Laterality: Right;   COLONOSCOPY  2009,12/30/2013   COLONOSCOPY WITH PROPOFOL N/A 04/16/2018   Tubulovillous polyp proximal transverse colon, large sessile polyp, proximal transverse colon;  Surgeon:  Robert Bellow, MD;  Location: [facility] ENDOSCOPY;  Service: Endoscopy;  Laterality: N/A;   ESOPHAGOGASTRODUODENOSCOPY (EGD) WITH PROPOFOL N/A 10/14/2018   Procedure: ESOPHAGOGASTRODUODENOSCOPY (EGD) WITH PROPOFOL;  Surgeon: Lucilla Lame, MD;  Location: [facility] ENDOSCOPY;  Service: Endoscopy;  Laterality: N/A;   TRACHEAL SURGERY  2013   TRACHEOSTOMY N/A    Dr Kathyrn Sheriff   XI ROBOTIC ASSISTED VENTRAL HERNIA N/A 10/18/2020   Procedure: XI ROBOTIC ASSISTED VENTRAL HERNIA;  Surgeon: Jules Husbands, MD;  Location: [facility] ORS;  Service: General;  Laterality: N/A;     A IV Location/Drains/Wounds Patient Lines/Drains/Airways Status     Active Line/Drains/Airways     Name Placement date Placement time Site Days   Peripheral IV 02/20/21 20 G 1.88" Anterior;Left;Upper Arm 02/20/21  1139  Arm  1   Gastrostomy/Enterostomy Gastrostomy 16 Fr. LUQ 01/29/20  1138  LUQ  389   Incision (Closed) 01/29/20 Abdomen Other (Comment) 01/29/20  1124  -- 389   Incision (Closed) 10/18/20 Breast Right 10/18/20  1521  -- 126   Incision (Closed) 10/18/20 Abdomen Other (Comment) 10/18/20  1717  -- 126   Incision - 2 Ports Abdomen 1: Right Left 11/06/18  0900  -- 838   Incision - 3 Ports Abdomen Right;Upper Right;Mid Right;Lower 10/18/20  1605  -- 126   Incision - 5 Ports Abdomen Umbilicus Upper;Medial Right;Upper Right;Lower Right;Mid 12/10/17  1350  -- 1169   Tracheostomy [place] 6 mm Cuffed 01/23/20  2130  6 mm  395   Tracheostomy --  --  --  --   Tracheostomy Laryngectomy --  --  --  --            Intake/Output Last 24 hours No intake or output data in the 24 hours ending 02/21/21 0322  Labs/Imaging Results for orders placed or performed during the hospital encounter of 02/20/21 (from the past 48 hour(s))  Lactic acid, plasma     Status: Abnormal   Collection Time: 02/20/21 11:37 AM  Result Value Ref Range   Lactic Acid, Venous 2.5 (HH) 0.5 - 1.9 mmol/L    Comment: CRITICAL RESULT CALLED TO, READ BACK BY AND VERIFIED WITH JANE LAMM 02/20/21 1217 MW Performed at [facility], [address] [place], [place] [address]   Comprehensive metabolic panel     Status: Abnormal   Collection Time: 02/20/21 11:37 AM  Result Value Ref Range   Sodium 134 (L) 135 - 145 mmol/L   Potassium 3.9 3.5 - 5.1 mmol/L   Chloride 102 98 - 111 mmol/L   CO2 22 22 - 32 mmol/L   Glucose, Bld 217 (H) 70 - 99 mg/dL    Comment: Glucose reference range applies only to samples taken after fasting for at  least 8 hours.   BUN 18 8 - 23 mg/dL   Creatinine, Ser 0.94 0.44 - 1.00 mg/dL   Calcium 9.2 8.9 - 10.3 mg/dL   Total Protein 7.7 6.5 - 8.1 g/dL   Albumin 3.9 3.5 - 5.0 g/dL   AST 59 (H) 15 - 41 U/L   ALT 41 0 - 44 U/L   Alkaline Phosphatase 69 38 - 126 U/L   Total Bilirubin 0.9 0.3 - 1.2 mg/dL   GFR, Estimated >60 >60 mL/min    Comment: (NOTE) Calculated using the CKD-EPI Creatinine Equation (2021)    Anion gap 10 5 - 15    Comment: Performed at [facility], [address], [place], [place] [address]  CBC with Differential  Status: Abnormal   Collection Time: 02/20/21 11:37 AM  Result Value Ref Range   WBC 10.0 4.0 - 10.5 K/uL   RBC 4.58 3.87 - 5.11 MIL/uL   Hemoglobin 12.5 12.0 - 15.0 g/dL   HCT 38.8 36.0 - 46.0 %   MCV 84.7 80.0 - 100.0 fL   MCH 27.3 26.0 - 34.0 pg   MCHC 32.2 30.0 - 36.0 g/dL   RDW 14.1 11.5 - 15.5 %   Platelets 130 (L) 150 - 400 K/uL   nRBC 0.0 0.0 - 0.2 %   Neutrophils Relative % 89 %   Neutro Abs 8.9 (H) 1.7 - 7.7 K/uL   Lymphocytes Relative 4 %   Lymphs Abs 0.4 (L) 0.7 - 4.0 K/uL   Monocytes Relative 6 %   Monocytes Absolute 0.6 0.1 - 1.0 K/uL   Eosinophils Relative 0 %   Eosinophils Absolute 0.0 0.0 - 0.5 K/uL   Basophils Relative 0 %   Basophils Absolute 0.0 0.0 - 0.1 K/uL   Immature Granulocytes 1 %   Abs Immature Granulocytes 0.06 0.00 - 0.07 K/uL    Comment: Performed at [facility], [address] [place], [place] [address]  Resp Panel by RT-PCR (Flu A&B, Covid)     Status: None   Collection Time: 02/20/21  2:23 PM   Specimen: Nasopharyngeal(NP) swabs in vial transport medium  Result Value Ref Range   SARS Coronavirus 2 by RT PCR NEGATIVE NEGATIVE    Comment: (NOTE) SARS-CoV-2 target nucleic acids are NOT DETECTED.  The SARS-CoV-2 RNA is generally detectable in upper respiratory specimens during the acute phase of infection. The lowest concentration of SARS-CoV-2 viral copies this assay can detect is 138  copies/mL. A negative result does not preclude SARS-Cov-2 infection and should not be used as the sole basis for treatment or other patient management decisions. A negative result may occur with  improper specimen collection/handling, submission of specimen other than nasopharyngeal swab, presence of viral mutation(s) within the areas targeted by this assay, and inadequate number of viral copies(<138 copies/mL). A negative result must be combined with clinical observations, patient history, and epidemiological information. The expected result is Negative.  Fact Sheet for Patients:  EntrepreneurPulse.com.au  Fact Sheet for Healthcare Providers:  IncredibleEmployment.be  This test is no t yet approved or cleared by the [place] FDA and  has been authorized for detection and/or diagnosis of SARS-CoV-2 by FDA under an Emergency Use Authorization (EUA). This EUA will remain  in effect (meaning this test can be used) for the duration of the COVID-19 declaration under Section 564(b)(1) of the Act, 21 U.S.C.section 360bbb-3(b)(1), unless the authorization is terminated  or revoked sooner.       Influenza A by PCR NEGATIVE NEGATIVE   Influenza B by PCR NEGATIVE NEGATIVE    Comment: (NOTE) The Xpert Xpress SARS-CoV-2/FLU/RSV plus assay is intended as an aid in the diagnosis of influenza from Nasopharyngeal swab specimens and should not be used as a sole basis for treatment. Nasal washings and aspirates are unacceptable for Xpert Xpress SARS-CoV-2/FLU/RSV testing.  Fact Sheet for Patients: EntrepreneurPulse.com.au  Fact Sheet for Healthcare Providers: IncredibleEmployment.be  This test is not yet approved or cleared by the [place] FDA and has been authorized for detection and/or diagnosis of SARS-CoV-2 by FDA under an Emergency Use Authorization (EUA). This EUA will remain in effect (meaning this test  can be used) for the duration of the COVID-19 declaration under Section 564(b)(1) of the Act, 21 U.S.C.  section 360bbb-3(b)(1), unless the authorization is terminated or revoked.  Performed at [facility], [address] [place], [place] [address]   Protime-INR     Status: None   Collection Time: 02/20/21  2:23 PM  Result Value Ref Range   Prothrombin Time 14.5 11.4 - 15.2 seconds   INR 1.1 0.8 - 1.2    Comment: (NOTE) INR goal varies based on device and disease states. Performed at [facility], [address] [place], [place] [address]   APTT     Status: None   Collection Time: 02/20/21  2:23 PM  Result Value Ref Range   aPTT 31 24 - 36 seconds    Comment: Performed at [facility], [address] [place], [place] [address]  Urinalysis, Complete w Microscopic     Status: Abnormal   Collection Time: 02/20/21  2:23 PM  Result Value Ref Range   Color, Urine YELLOW YELLOW   APPearance CLEAR CLEAR   Specific Gravity, Urine >1.030 (H) 1.005 - 1.030   pH 5.5 5.0 - 8.0   Glucose, UA NEGATIVE NEGATIVE mg/dL   Hgb urine dipstick NEGATIVE NEGATIVE   Bilirubin Urine NEGATIVE NEGATIVE   Ketones, ur 15 (A) NEGATIVE mg/dL   Protein, ur 30 (A) NEGATIVE mg/dL   Nitrite NEGATIVE NEGATIVE   Leukocytes,Ua NEGATIVE NEGATIVE   Squamous Epithelial / LPF 0-5 0 - 5   WBC, UA 0-5 0 - 5 WBC/hpf   RBC / HPF 0-5 0 - 5 RBC/hpf   Bacteria, UA FEW (A) NONE SEEN    Comment: Performed at [facility], [address], [place],  [address]   DG Chest 2 View  Result Date: 02/20/2021 CLINICAL DATA:  Fever. EXAM: CHEST - 2 VIEW COMPARISON:  02/01/2020 FINDINGS: Tracheostomy tube again noted. Asymmetric elevation right hemidiaphragm similar to prior. Interstitial markings are diffusely coarsened with chronic features. Stable right hilar prominence, likely vascular IMPRESSION: No active cardiopulmonary disease. Electronically Signed   By: Misty Stanley M.D.    On: 02/20/2021 09:49   CT ABDOMEN PELVIS W CONTRAST  Result Date: 02/20/2021 CLINICAL DATA:  Abdominal pain, painful PEG tube since Friday with drainage and tenderness, febrile; history breast cancer post lumpectomy, radiation therapy, chemotherapy, type II diabetes mellitus EXAM: CT ABDOMEN AND PELVIS WITH CONTRAST TECHNIQUE: Multidetector CT imaging of the abdomen and pelvis was performed using the standard protocol following bolus administration of intravenous contrast. CONTRAST:  152mL OMNIPAQUE IOHEXOL 300 MG/ML SOLN IV. No oral contrast. COMPARISON:  12/25/2018 FINDINGS: Lower chest: Dependent bibasilar atelectasis. Distended esophagus with air-fluid level. Hepatobiliary: Post cholecystectomy. Fatty infiltration of liver. No focal hepatic mass. Calcified granuloma LEFT lobe liver. Pancreas: Normal appearance Spleen: Normal appearance Adrenals/Urinary Tract: Adrenal glands, kidneys, ureters, and bladder normal appearance Stomach/Bowel: Normal appendix. Large and small bowel loops normal appearance. PEG tube balloon within stomach but the balloon and stomach appear partially retracted into the anterior abdominal wall. Questionable wall thickening of adjacent stomach versus artifact from underdistention. Vascular/Lymphatic: Atherosclerotic calcifications aorta and iliac arteries without aneurysm. Few normal sized retroperitoneal and mesenteric lymph nodes. No abdominal or pelvic adenopathy. Reproductive: Uterus surgically absent. Question atrophic LEFT ovary. Other: No free air or free fluid. Small supra umbilical hernia containing fat. Soft tissue thickening at the umbilicus and underlying anterior abdominal wall fascial planes. Small focal subcutaneous fluid collection with enhancing rim LEFT periumbilical 2.4 x 1.5 x 1.2 cm, new, question abscess. Musculoskeletal: Osseous demineralization. IMPRESSION: PEG tube balloon within stomach but the balloon  and stomach appear partially retracted into the anterior  abdominal wall question hernia versus abdominal wall laxity. Questionable gastric wall thickening versus artifact from underdistention. Soft tissue thickening at the umbilicus and underlying anterior abdominal wall fascial planes with a small subcutaneous fluid collection with enhancing rim LEFT periumbilical 2.4 x 1.5 x 1.2 cm, question abscess. Distended esophagus with air-fluid level. Small supra umbilical hernia containing fat. Fatty infiltration of liver. Aortic Atherosclerosis (ICD10-I70.0). Electronically Signed   By: Lavonia Dana M.D.   On: 02/20/2021 14:12    Pending Labs Unresulted Labs (From admission, onward)     Start     Ordered   02/21/21 1660  Basic metabolic panel  Tomorrow morning,   STAT       Question:  Specimen collection method  Answer:  IV Team=IV Team collect   02/20/21 1613   02/21/21 0500  CBC  Tomorrow morning,   STAT       Question:  Specimen collection method  Answer:  IV Team=IV Team collect   02/20/21 1613   02/21/21 0500  Procalcitonin  Daily,   STAT     Question:  Specimen collection method  Answer:  IV Team=IV Team collect   02/20/21 2128   02/20/21 2236  Strep pneumoniae urinary antigen  Once,   STAT        02/20/21 2235   02/20/21 2236  Legionella Pneumophila Serogp 1 Ur Ag  Once,   STAT        02/20/21 2235   02/20/21 2129  Procalcitonin - Baseline  ONCE - STAT,   STAT       Question:  Specimen collection method  Answer:  IV Team=IV Team collect   02/20/21 2128   02/20/21 2129  Lactic acid, plasma  STAT Now then every 3 hours,   STAT     Question:  Specimen collection method  Answer:  IV Team=IV Team collect   02/20/21 2129   02/20/21 1616  Respiratory (~20 pathogens) panel by PCR  (Respiratory panel by PCR (~20 pathogens, ~24 hr TAT)  w precautions)  Once,   STAT        02/20/21 1615   02/20/21 0925  Blood culture (routine x 2)  BLOOD CULTURE X 2,   STAT      02/20/21 0924   02/20/21 0924  Lactic acid, plasma  Now then every 2 hours,   STAT       02/20/21 0924            Vitals/Pain Today's Vitals   02/21/21 0030 02/21/21 0100 02/21/21 0130 02/21/21 0300  BP: (!) 142/75 135/69 139/71   Pulse: 89 84 87   Resp:  (!) 22    Temp:      TempSrc:      SpO2: 94% 94% 96% 96%  Weight:      Height:      PainSc:        Isolation Precautions Droplet precaution  Medications Medications  lactated ringers infusion ( Intravenous New Bag/Given 02/20/21 1747)  heparin injection 5,000 Units (5,000 Units Subcutaneous Given 02/20/21 2240)  ipratropium-albuterol (DUONEB) 0.5-2.5 (3) MG/3ML nebulizer solution 3 mL (3 mLs Nebulization Given 02/21/21 0248)  ceFEPIme (MAXIPIME) 2 g in sodium chloride 0.9 % 100 mL IVPB (2 g Intravenous New Bag/Given 02/21/21 0320)  vancomycin (VANCOCIN) IVPB 1000 mg/200 mL premix (has no administration in time range)  busPIRone (BUSPAR) tablet 15 mg (15 mg Per Tube Given 02/20/21 2232)  escitalopram (LEXAPRO) tablet 30  mg (30 mg Per Tube Given 97/02/63 7858)  folic acid (FOLVITE) tablet 1 mg (1 mg Per Tube Given 02/20/21 2041)  gabapentin (NEURONTIN) tablet 600 mg (600 mg Per Tube Given 02/20/21 2232)  pravastatin (PRAVACHOL) tablet 20 mg (has no administration in time range)  meclizine (ANTIVERT) tablet 25 mg (25 mg Oral Given 02/20/21 2040)  melatonin tablet 5 mg (5 mg Per Tube Given 02/20/21 2232)  metoprolol tartrate (LOPRESSOR) tablet 25 mg (25 mg Per Tube Given 02/20/21 2040)  rOPINIRole (REQUIP) tablet 0.5 mg (0.5 mg Per Tube Given 02/20/21 2232)  pantoprazole (PROTONIX) EC tablet 40 mg (40 mg Oral Given 02/20/21 2041)  insulin aspart (novoLOG) injection 0-15 Units (has no administration in time range)  ipratropium-albuterol (DUONEB) 0.5-2.5 (3) MG/3ML nebulizer solution 3 mL (3 mLs Nebulization Given 02/21/21 0248)  ipratropium-albuterol (DUONEB) 0.5-2.5 (3) MG/3ML nebulizer solution 3 mL (has no administration in time range)  feeding supplement (JEVITY 1.2 CAL) liquid 1,000 mL (1,000 mLs Per Tube  New Bag/Given 02/20/21 2239)  free water 100 mL (100 mLs Per Tube Given 02/21/21 0245)  acetaminophen (OFIRMEV) IV 1,000 mg (0 mg Intravenous Stopped 02/21/21 0320)  iohexol (OMNIPAQUE) 300 MG/ML solution 100 mL (100 mLs Intravenous Contrast Given 02/20/21 1340)  ceFEPIme (MAXIPIME) 2 g in sodium chloride 0.9 % 100 mL IVPB (0 g Intravenous Stopped 02/20/21 1632)  metroNIDAZOLE (FLAGYL) IVPB 500 mg (0 mg Intravenous Stopped 02/20/21 1932)  sodium chloride 0.9 % bolus 1,000 mL (0 mLs Intravenous Stopped 02/20/21 1632)  ipratropium-albuterol (DUONEB) 0.5-2.5 (3) MG/3ML nebulizer solution 3 mL (3 mLs Nebulization Given 02/20/21 1611)  vancomycin (VANCOREADY) IVPB 1500 mg/300 mL (0 mg Intravenous Stopped 02/21/21 0118)    Mobility walks Low fall risk   Focused Assessments    R Recommendations: See Admitting Provider Note  Report given to:   Additional Notes:

## 2021-02-21 NOTE — Progress Notes (Signed)
Pharmacy Antibiotic Note  Angela David is a 80 y.o. female admitted on 02/20/2021.  Pharmacy has been consulted for cefepime and vancomycin dosing for abscess. Pt was recently placed on Augmentin for possible cellulitis.   Tmax 101.5, afeb since, WBC WNL, procal 0.17  Bcx NG<24h, MRSA PCR neg  Plan: Cefepime  Will continue cefepime 2g IV q12h Vancomycin  Will transition to vancomycin 1250mg  IV q24h Est AUC: 514.9 Used: Scr 0.84, IBW, Vd 0.72 Obtain vanc level around 4th or 5th dose if continued  Monitor renal function and adjust dose as clinically indicated  Height: 5\' 4"  (162.6 cm) Weight: 76.7 kg (169 lb 1.5 oz) IBW/kg (Calculated) : 54.7  Temp (24hrs), Avg:99.9 F (37.7 C), Min:98.2 F (36.8 C), Max:101.5 F (38.6 C)  Recent Labs  Lab 02/20/21 1137 02/21/21 0507  WBC 10.0 6.8  CREATININE 0.94 0.84  LATICACIDVEN 2.5* 2.6*     Estimated Creatinine Clearance: 55.3 mL/min (by C-G formula based on SCr of 0.84 mg/dL).    Allergies  Allergen Reactions   Shellfish Allergy Shortness Of Breath    respitatory distress    Sodium Sulfite Shortness Of Breath    Severe Congestion that creates inability to breath   Sulfa Antibiotics Shortness Of Breath and Other (See Comments)    respitatory distress   Codeine Other (See Comments)    Per patient "it kept her up all night"   Glucerna [Alimentum] Itching   Wound Dressing Adhesive Rash    Antimicrobials this admission: 12/26 metronidazole x 1 12/26 cefepime >>  12/26 vancomycin >>   Dose Adjustments  12/27: vancomycin 1000mg  q24h>1250mg  q24h  Microbiology results: 12/26 BCx: NG<24h 1226 MRSA PCR: not detected   Thank you for allowing pharmacy to be a part of this patients care.  Narda Rutherford, PharmD Pharmacy Resident  02/21/2021 12:31 PM

## 2021-02-21 NOTE — Progress Notes (Signed)
Reason for Consultation: Concerns regarding abdominal wall abscess   History of Present Illness: Angela David is a 80 y.o. female known to surgical team with a prior history of a hand-assisted laparoscopic right hemicolectomy by Dr. Dahlia Byes on 11/06/2018.  The patient also has a history of robotic assisted gastrostomy tube placement with him on 01/29/2020 and more recently a right breast lumpectomy with a robotic assisted incisional ventral hernia repair on 10/18/2020.  Patient was brought to the emergency room 12/26 due to hypoxia with oxygen saturation of 80% on room air.  She does have a tracheostomy in place.  During the daytime, she is usually on room air but does have vent support at nighttime.  The patient reports that recently she had an infection around the gastrostomy tube and was treated with antibiotics and this has improved since.  However she does report some pain around the gastrostomy tube site still.  Along with some tenderness around the central anterior abdominal wall, where her hernia had been repaired.  This may be due to the fact that she also has a cough; but fortunately, her chest x-ray does not show any pneumonia.  She did report a fever at home and also a fever in the emergency room of 101.5 on initial evaluation.  Patient had a CT scan of the abdomen pelvis 12/26 which shows a potential small focal subcutaneous fluid collection to the left of the umbilicus measuring 2.4 x 1.5 x 1.2 cm concerning for possible abscess.  This is likely consistent with a postoperative seroma from her hernia repair.  In the tenderness associated in this area from her recent episodes of coughing.  Today she appears alert, without complaint, just wanted to ensure that she was on antibiotics which she is.  Tube feeds running at 10 mL/h.         Physical Exam  CONSTITUTIONAL: No acute distress. HEENT:  Normocephalic, atraumatic. NECK: Tracheostomy. No jugular venous distension. RESPIRATORY:   Normal respiratory effort without pathologic use of accessory muscles.  She is able to mouth words without respiratory distress CARDIOVASCULAR: Regular rhythm and rate. GI: The abdomen is soft, nondistended, with some tenderness at the gastrostomy tube insertion site and about the periumbilical area.  There is pink hue particularly where the tube inserts which is likely related to some irritation from mild leakage around the tube.  The gauze drain sponges present there have greenish tint, suggestive of Pseudomonas colonization.  The area of concern on the CT scan, the patient does have an area of firmness which is to the left of the umbilicus.  This is the area where the patient had her ventral hernia repair and most likely reflects residual seroma from the hernia sac.  There is no overlying skin erythema or induration. SKIN: Skin turgor is normal. There are no pathologic skin lesions.  NEUROLOGIC:  Motor and sensation is grossly normal.  Cranial nerves are grossly intact. PSYCH:  Alert and oriented to person, place and time. Affect is normal.        Imaging: DG Chest 2 View   Result Date: 02/20/2021 CLINICAL DATA:  Fever. EXAM: CHEST - 2 VIEW COMPARISON:  02/01/2020 FINDINGS: Tracheostomy tube again noted. Asymmetric elevation right hemidiaphragm similar to prior. Interstitial markings are diffusely coarsened with chronic features. Stable right hilar prominence, likely vascular IMPRESSION: No active cardiopulmonary disease. Electronically Signed   By: Misty Stanley M.D.   On: 02/20/2021 09:49    CT ABDOMEN PELVIS W CONTRAST   Result Date:  02/20/2021 CLINICAL DATA:  Abdominal pain, painful PEG tube since Friday with drainage and tenderness, febrile; history breast cancer post lumpectomy, radiation therapy, chemotherapy, type II diabetes mellitus EXAM: CT ABDOMEN AND PELVIS WITH CONTRAST TECHNIQUE: Multidetector CT imaging of the abdomen and pelvis was performed using the standard protocol  following bolus administration of intravenous contrast. CONTRAST:  159mL OMNIPAQUE IOHEXOL 300 MG/ML SOLN IV. No oral contrast. COMPARISON:  12/25/2018 FINDINGS: Lower chest: Dependent bibasilar atelectasis. Distended esophagus with air-fluid level. Hepatobiliary: Post cholecystectomy. Fatty infiltration of liver. No focal hepatic mass. Calcified granuloma LEFT lobe liver. Pancreas: Normal appearance Spleen: Normal appearance Adrenals/Urinary Tract: Adrenal glands, kidneys, ureters, and bladder normal appearance Stomach/Bowel: Normal appendix. Large and small bowel loops normal appearance. PEG tube balloon within stomach but the balloon and stomach appear partially retracted into the anterior abdominal wall. Questionable wall thickening of adjacent stomach versus artifact from underdistention. Vascular/Lymphatic: Atherosclerotic calcifications aorta and iliac arteries without aneurysm. Few normal sized retroperitoneal and mesenteric lymph nodes. No abdominal or pelvic adenopathy. Reproductive: Uterus surgically absent. Question atrophic LEFT ovary. Other: No free air or free fluid. Small supra umbilical hernia containing fat. Soft tissue thickening at the umbilicus and underlying anterior abdominal wall fascial planes. Small focal subcutaneous fluid collection with enhancing rim LEFT periumbilical 2.4 x 1.5 x 1.2 cm, new, question abscess. Musculoskeletal: Osseous demineralization. IMPRESSION: PEG tube balloon within stomach but the balloon and stomach appear partially retracted into the anterior abdominal wall question hernia versus abdominal wall laxity. Questionable gastric wall thickening versus artifact from underdistention. Soft tissue thickening at the umbilicus and underlying anterior abdominal wall fascial planes with a small subcutaneous fluid collection with enhancing rim LEFT periumbilical 2.4 x 1.5 x 1.2 cm, question abscess. Distended esophagus with air-fluid level. Small supra umbilical hernia  containing fat. Fatty infiltration of liver. Aortic Atherosclerosis (ICD10-I70.0). Electronically Signed   By: Lavonia Dana M.D.   On: 02/20/2021 14:12     Assessment and Plan: This is a 80 y.o. female with concerns raised for a possible subcutaneous periumbilical abscess.  - The area of concern on the CT scan does not show any overlying erythema or induration.  The area does appear slightly tender, and I believe she feels this is likely due to her cough and has no recent significant exacerbation.  And I think based on where the prior incisional hernia was located, I think this is remnant of the hernia sac rather than a true abscess or infection.  The area around the G-tube has very minimal discoloration.   I do not see any evidence of infection I do not think the patient warrants any surgical intervention.   Okay to continue to use feeding tube and may advance to goal from surgical standpoint. - Please let us know if there is any other concerns or if we can be of any further assistance.   I spent 20 minutes dedicated to the care of this patient on the date of this encounter.  Ronny Bacon, M.D., [facility]  02/21/2021 ; 12:35 PM

## 2021-02-21 NOTE — Progress Notes (Signed)
PROGRESS NOTE    Angela David  ZGY:[id number] DOB: [birth year] DOA: 02/20/2021 PCP: Iva Boop, MD    Brief Narrative:   80 y.o. 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   This patient presents from home due to fevers that started this morning.  As high as 101 F at home.  She also endorses shortness of breath that started yesterday.  Also has a dry cough that started yesterday as well.  Endorses generalized abdominal pain.  She is not encephalopathic.  No family present at bedside upon my evaluation.  Denies any urinary complaints.  Patient has had issues with anxiety and transient desaturation.  She is ventilator dependent at night.  My evaluation the following day she is breathing a little clearer and anxiety is improved.   Assessment & Plan:   Principal Problem:   Sepsis ([place])  Acute hypoxic respiratory failure:  Possible early pneumonia that has not developed on plain film imaging.  We will repeat chest x-ray in the morning.   Plan: Continue vancomycin and cefepime for now Follow cultures can likely de-escalate in 24 hours Continue stepdown status for now Tracheostomy care Ventilator at night Currently on fairly minimal amount of oxygen Mentating clearly Respiratory viral panel negative Possible discharge in 24 to 48 hours after de-escalation to p.o. antibiotics   Concern for deep abdominal wall abscess:  CT abdomen pelvis with contrast demonstrated possible periumbilical deep abdominal wall abscess.   Seen by general surgery Currently no surgical intervention warranted Plan: Continue broad-spectrum antibiotics as above De-escalate within 24 to  48 hours Monitor vitals and fever curve   Sepsis secondary to above:  Plan as above.   Meets sepsis criteria with tachycardia, leukocytosis and fevers.   Dyslipidemia: Continue home lovastatin   NIDDM: Type II.  Hold home Glucophage.  Start sliding scale insulin every 4 hours.   PEG tube dependence:  No plans for surgical intervention RD consultation for tube feeds   Hypertension: Continue home Lopressor   GERD: Continue home Protonix   RLS: Continue home Requip   Depression/anxiety: Continue home Lexapro, BuSpar   Diabetic neuropathy: Continue home gabapentin   DVT prophylaxis: SQH Code Status: Partial Family Communication: Annette Stable [phone] on 12/27 Disposition Plan: Status is: Inpatient  Remains inpatient appropriate because: Sepsis secondary to abdominal wall cellulitis versus early pneumonia.  Remains on broad-spectrum IV antibiotics.  Doing well overall.  Anticipate discharge in the next 24 to 48 hours.       Level of care: ICU  Consultants:  General surgery  Procedures:  None  Antimicrobials: Vancomycin Cefepime   Subjective: Seen and examined.  Received antianxiety medication this morning so more calm.  Answers all questions appropriately.  Visibly appears in no distress.  Objective: Vitals:   02/21/21 0900 02/21/21 1000 02/21/21 1100 02/21/21 1408  BP: 131/62 (!) 113/56 131/69   Pulse: 90 82 84   Resp: 17 (!) 25 (!) 28   Temp:      TempSrc:      SpO2: 98% 94% 95% 93%  Weight:      Height:        Intake/Output Summary (Last 24 hours) at 02/21/2021 1435 Last data filed at 02/21/2021 0900 Gross per 24 hour  Intake 528.16 ml  Output 200 ml  Net 328.16 ml   Filed Weights   02/20/21 0922 02/21/21 0500  Weight: 73.5 kg 76.7 kg    Examination:  General exam: No acute distress Respiratory system: Scattered crackles.  Tracheostomy.  5 L Cardiovascular system: S1-S2, RRR, no murmurs, no pedal edema Gastrointestinal system:  Soft, NT/ND, PEG tube in place, mild erythema around stoma site Central nervous system: Alert and oriented. No focal neurological deficits. Extremities: Symmetric 5 x 5 power. Skin: No rashes, lesions or ulcers Psychiatry: Judgement and insight appear normal. Mood & affect appropriate.     Data Reviewed: I have personally reviewed following labs and imaging studies  CBC: Recent Labs  Lab 02/20/21 1137 02/21/21 0507  WBC 10.0 6.8  NEUTROABS 8.9*  --   HGB 12.5 11.1*  HCT 38.8 34.8*  MCV 84.7 84.7  PLT 130* 737*   Basic Metabolic Panel: Recent Labs  Lab 02/20/21 1137 02/21/21 0507  NA 134* 134*  K 3.9 3.4*  CL 102 103  CO2 22 23  GLUCOSE 217* 251*  BUN 18 14  CREATININE 0.94 0.84  CALCIUM 9.2 8.3*   GFR: Estimated Creatinine Clearance: 55.3 mL/min (by C-G formula based on SCr of 0.84 mg/dL). Liver Function Tests: Recent Labs  Lab 02/20/21 1137  AST 59*  ALT 41  ALKPHOS 69  BILITOT 0.9  PROT 7.7  ALBUMIN 3.9   No results for input(s): LIPASE, AMYLASE in the last 168 hours. No results for input(s): AMMONIA in the last 168 hours. Coagulation Profile: Recent Labs  Lab 02/20/21 1423  INR 1.1   Cardiac Enzymes: No results for input(s): CKTOTAL, CKMB, CKMBINDEX, TROPONINI in the last 168 hours. BNP (last 3 results) No results for input(s): PROBNP in the last 8760 hours. HbA1C: No results for input(s): HGBA1C in the last 72 hours. CBG: Recent Labs  Lab 02/21/21 0412 02/21/21 0849 02/21/21 1137  GLUCAP 237* 205* 146*   Lipid Profile: No results for input(s): CHOL, HDL, LDLCALC, TRIG, CHOLHDL, LDLDIRECT in the last 72 hours. Thyroid Function Tests: No results for input(s): TSH, T4TOTAL, FREET4, T3FREE, THYROIDAB in the last 72 hours. Anemia Panel: No results for input(s): VITAMINB12, FOLATE, FERRITIN, TIBC, IRON, RETICCTPCT in the last 72 hours. Sepsis Labs: Recent Labs  Lab 02/20/21 1137 02/21/21 0507 02/21/21 0714  PROCALCITON  --  0.17  --    LATICACIDVEN 2.5* 2.6* 3.2*    Recent Results (from the past 240 hour(s))  Blood culture (routine x 2)     Status: None (Preliminary result)   Collection Time: 02/20/21 11:37 AM   Specimen: BLOOD  Result Value Ref Range Status   Specimen Description BLOOD BLOOD LEFT FOREARM  Final   Special Requests   Final    BOTTLES DRAWN AEROBIC AND ANAEROBIC Blood Culture results may not be optimal due to an excessive volume of blood received in culture bottles   Culture   Final    NO GROWTH < 24 HOURS Performed at [facility], [address] [place], [place] [address]    Report Status PENDING  Incomplete  Resp Panel by RT-PCR (Flu A&B, Covid)     Status: None   Collection Time: 02/20/21  2:23 PM   Specimen: Nasopharyngeal(NP) swabs in vial transport medium  Result Value Ref Range Status   SARS Coronavirus 2 by RT PCR NEGATIVE NEGATIVE Final    Comment: (NOTE) SARS-CoV-2 target nucleic acids are NOT DETECTED.  The SARS-CoV-2 RNA is generally detectable in upper respiratory specimens during the  acute phase of infection. The lowest concentration of SARS-CoV-2 viral copies this assay can detect is 138 copies/mL. A negative result does not preclude SARS-Cov-2 infection and should not be used as the sole basis for treatment or other patient management decisions. A negative result may occur with  improper specimen collection/handling, submission of specimen other than nasopharyngeal swab, presence of viral mutation(s) within the areas targeted by this assay, and inadequate number of viral copies(<138 copies/mL). A negative result must be combined with clinical observations, patient history, and epidemiological information. The expected result is Negative.  Fact Sheet for Patients:  EntrepreneurPulse.com.au  Fact Sheet for Healthcare Providers:  IncredibleEmployment.be  This test is no t yet approved or cleared by the [place] FDA and  has  been authorized for detection and/or diagnosis of SARS-CoV-2 by FDA under an Emergency Use Authorization (EUA). This EUA will remain  in effect (meaning this test can be used) for the duration of the COVID-19 declaration under Section 564(b)(1) of the Act, 21 U.S.C.section 360bbb-3(b)(1), unless the authorization is terminated  or revoked sooner.       Influenza A by PCR NEGATIVE NEGATIVE Final   Influenza B by PCR NEGATIVE NEGATIVE Final    Comment: (NOTE) The Xpert Xpress SARS-CoV-2/FLU/RSV plus assay is intended as an aid in the diagnosis of influenza from Nasopharyngeal swab specimens and should not be used as a sole basis for treatment. Nasal washings and aspirates are unacceptable for Xpert Xpress SARS-CoV-2/FLU/RSV testing.  Fact Sheet for Patients: EntrepreneurPulse.com.au  Fact Sheet for Healthcare Providers: IncredibleEmployment.be  This test is not yet approved or cleared by the [place] FDA and has been authorized for detection and/or diagnosis of SARS-CoV-2 by FDA under an Emergency Use Authorization (EUA). This EUA will remain in effect (meaning this test can be used) for the duration of the COVID-19 declaration under Section 564(b)(1) of the Act, 21 U.S.C. section 360bbb-3(b)(1), unless the authorization is terminated or revoked.  Performed at [facility], [place], [place] [address]   Respiratory (~20 pathogens) panel by PCR     Status: None   Collection Time: 02/20/21  4:16 PM   Specimen: Nasopharyngeal Swab; Respiratory  Result Value Ref Range Status   Adenovirus NOT DETECTED NOT DETECTED Final   Coronavirus 229E NOT DETECTED NOT DETECTED Final    Comment: (NOTE) The Coronavirus on the Respiratory Panel, DOES NOT test for the novel  Coronavirus (2019 nCoV)    Coronavirus HKU1 NOT DETECTED NOT DETECTED Final   Coronavirus NL63 NOT DETECTED NOT DETECTED Final   Coronavirus OC43 NOT  DETECTED NOT DETECTED Final   Metapneumovirus NOT DETECTED NOT DETECTED Final   Rhinovirus / Enterovirus NOT DETECTED NOT DETECTED Final   Influenza A NOT DETECTED NOT DETECTED Final   Influenza B NOT DETECTED NOT DETECTED Final   Parainfluenza Virus 1 NOT DETECTED NOT DETECTED Final   Parainfluenza Virus 2 NOT DETECTED NOT DETECTED Final   Parainfluenza Virus 3 NOT DETECTED NOT DETECTED Final   Parainfluenza Virus 4 NOT DETECTED NOT DETECTED Final   Respiratory Syncytial Virus NOT DETECTED NOT DETECTED Final   Bordetella pertussis NOT DETECTED NOT DETECTED Final   Bordetella Parapertussis NOT DETECTED NOT DETECTED Final   Chlamydophila pneumoniae NOT DETECTED NOT DETECTED Final   Mycoplasma pneumoniae NOT DETECTED NOT DETECTED Final    Comment: Performed at [facility], [place]. [address] [place], [place] [address]  MRSA Next Gen by PCR, Nasal     Status: None  Collection Time: 02/21/21  4:16 AM   Specimen: Nasal Mucosa; Nasal Swab  Result Value Ref Range Status   MRSA by PCR Next Gen NOT DETECTED NOT DETECTED Final    Comment: (NOTE) The GeneXpert MRSA Assay (FDA approved for NASAL specimens only), is one component of a comprehensive MRSA colonization surveillance program. It is not intended to diagnose MRSA infection nor to guide or monitor treatment for MRSA infections. Test performance is not FDA approved in patients less than 75 years old. Performed at [facility], [address], [place], [place] [address]          Radiology Studies: DG Chest 2 View  Result Date: 02/20/2021 CLINICAL DATA:  Fever. EXAM: CHEST - 2 VIEW COMPARISON:  02/01/2020 FINDINGS: Tracheostomy tube again noted. Asymmetric elevation right hemidiaphragm similar to prior. Interstitial markings are diffusely coarsened with chronic features. Stable right hilar prominence, likely vascular IMPRESSION: No active cardiopulmonary disease. Electronically Signed   By: Misty Stanley M.D.   On:  02/20/2021 09:49   CT ABDOMEN PELVIS W CONTRAST  Result Date: 02/20/2021 CLINICAL DATA:  Abdominal pain, painful PEG tube since Friday with drainage and tenderness, febrile; history breast cancer post lumpectomy, radiation therapy, chemotherapy, type II diabetes mellitus EXAM: CT ABDOMEN AND PELVIS WITH CONTRAST TECHNIQUE: Multidetector CT imaging of the abdomen and pelvis was performed using the standard protocol following bolus administration of intravenous contrast. CONTRAST:  176mL OMNIPAQUE IOHEXOL 300 MG/ML SOLN IV. No oral contrast. COMPARISON:  12/25/2018 FINDINGS: Lower chest: Dependent bibasilar atelectasis. Distended esophagus with air-fluid level. Hepatobiliary: Post cholecystectomy. Fatty infiltration of liver. No focal hepatic mass. Calcified granuloma LEFT lobe liver. Pancreas: Normal appearance Spleen: Normal appearance Adrenals/Urinary Tract: Adrenal glands, kidneys, ureters, and bladder normal appearance Stomach/Bowel: Normal appendix. Large and small bowel loops normal appearance. PEG tube balloon within stomach but the balloon and stomach appear partially retracted into the anterior abdominal wall. Questionable wall thickening of adjacent stomach versus artifact from underdistention. Vascular/Lymphatic: Atherosclerotic calcifications aorta and iliac arteries without aneurysm. Few normal sized retroperitoneal and mesenteric lymph nodes. No abdominal or pelvic adenopathy. Reproductive: Uterus surgically absent. Question atrophic LEFT ovary. Other: No free air or free fluid. Small supra umbilical hernia containing fat. Soft tissue thickening at the umbilicus and underlying anterior abdominal wall fascial planes. Small focal subcutaneous fluid collection with enhancing rim LEFT periumbilical 2.4 x 1.5 x 1.2 cm, new, question abscess. Musculoskeletal: Osseous demineralization. IMPRESSION: PEG tube balloon within stomach but the balloon and stomach appear partially retracted into the anterior  abdominal wall question hernia versus abdominal wall laxity. Questionable gastric wall thickening versus artifact from underdistention. Soft tissue thickening at the umbilicus and underlying anterior abdominal wall fascial planes with a small subcutaneous fluid collection with enhancing rim LEFT periumbilical 2.4 x 1.5 x 1.2 cm, question abscess. Distended esophagus with air-fluid level. Small supra umbilical hernia containing fat. Fatty infiltration of liver. Aortic Atherosclerosis (ICD10-I70.0). Electronically Signed   By: Lavonia Dana M.D.   On: 02/20/2021 14:12   DG Chest Port 1 View  Result Date: 02/21/2021 CLINICAL DATA:  Dyspnea R06.00 (ICD-10-CM) EXAM: PORTABLE CHEST 1 VIEW COMPARISON:  Multiple priors, most recent 02/20/2021. FINDINGS: Chronic coarsening of the interstitial markings with streaky areas of probable atelectasis. No confluent consolidation. No definite pleural effusions or visible pneumothorax. Biapical pleuroparenchymal scarring. Tracheostomy tube projects midline over the tracheal air column at the level of the clavicular heads. Cardiomediastinal silhouette is within normal limits. Calcific atherosclerosis of the aorta. Polyarticular degenerative change. IMPRESSION: Chronic  coarsening of the interstitial markings with streaky areas of probable atelectasis. No confluent consolidation. Electronically Signed   By: Margaretha Sheffield M.D.   On: 02/21/2021 05:04        Scheduled Meds:  busPIRone  15 mg Per Tube BID   Chlorhexidine Gluconate Cloth  6 each Topical Q0600   escitalopram  30 mg Per Tube Daily   feeding supplement (KATE FARMS STANDARD 1.4)  325 mL Per Tube QID   folic acid  1 mg Per Tube Daily   free water  100 mL Per Tube QID   gabapentin  600 mg Per Tube TID   heparin  5,000 Units Subcutaneous Q8H   insulin aspart  0-15 Units Subcutaneous Q4H   ipratropium-albuterol  3 mL Nebulization Q6H   meclizine  25 mg Per Tube Daily   mouth rinse  15 mL Mouth Rinse q12n4p    melatonin  5 mg Per Tube QHS   metoprolol tartrate  25 mg Per Tube Daily   pantoprazole sodium  40 mg Per Tube Daily   pravastatin  20 mg Per Tube q1800   rOPINIRole  0.5 mg Per Tube QHS   Continuous Infusions:  acetaminophen 400 mL/hr at 02/21/21 0900   ceFEPime (MAXIPIME) IV 2 g (02/21/21 1420)   vancomycin       LOS: 1 day    Time spent: 35 minutes    Sidney Ace, MD [facility]   If 7PM-7AM, please contact night-coverage  02/21/2021, 2:35 PM

## 2021-02-21 NOTE — Progress Notes (Signed)
Upon entry into the room, noticed patient was having a panic attack. No relief noted after nebulizer and increase in FIO2. Anxiety meds given by RN. Still no change. Placed patient back on ventilator with increased settings. Will wean as tolerated.

## 2021-02-21 NOTE — Progress Notes (Signed)
NAME:  Angela David, MRN:  [id number], DOB:  [birth year], LOS: 1 ADMISSION DATE:  02/20/2021, CONSULTATION DATE:  02/20/2021 REFERRING MD: Imagene Sheller, MD. 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  Patient reports having some pain, irritation and drainage around the PEG site since Friday. Later she developed fever, chills, generalized  malaise and SOB. Patient was started on Augmentin for suspected cellulitis of  around PEG site with no improvement.  ED Course: On arrival to the ED, she was febrile 101.5 (38.6) with blood pressure 126/65 mm Hg and pulse rate 111 beats/min, RR 27 with sats 87% on RA. There were no focal neurological deficits;  Patient mildly tachycardic mildly tachypneic hypoxic with mild abdominal pain. Given recent treatment for cellulitis, CT imaging was ordered.  Pertinent Labs in Red/Diagnostics Findings: Na+/ K+: 134/3.9 Glucose: 217 BUN/Cr.: 18/0.94 AST/ALT:59/41   WBC/ TMAX: 10.0/ afebrile PCT: pending Lactic acid: 2.5 COVID PCR: Negative  Patient had a CT scan of the abdomen pelvis today which shows a potential small focal subcutaneous fluid collection to the left of the umbilicus measuring 2.4 x 1.5 x 1.2 cm concerning for possible abscess. Surgery consulted for further evaluation.  Patient also with O2 requirement and possibly developing pneumonia but no infiltrates on chest x-ray. Patient was started on broad spectrum antibiotics and admitted to hospitalist service. PCCM consulted to assist with vent management.   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 12/26: CTA abdomen and pelvis> shows a potential small focal subcutaneous fluid collection to the left of the umbilicus measuring 2.4 x 1.5 x 1.2 cm concerning for possible abscess  Micro Data:  12/26: SARS-CoV-2 PCR> negative 12/26: Influenza PCR> negative 12/26: Blood culture x2> 12/26: Urine Culture> 12/26: MRSA PCR>>  12/26: Strep pneumo urinary antigen> 12/26: Legionella urinary antigen> Antimicrobials:  12/26 metronidazole x 1 12/26 cefepime >>  12/26 vancomycin >>    OBJECTIVE  Blood pressure (!) 106/95, pulse 83, temperature 98.2 F (36.8 C), temperature source Oral, resp. rate (!) 21, height '5\' 4"'  (1.626 m), weight 76.7 kg, SpO2 93 %.    Vent Mode: PSV FiO2 (%):  [28 %-40 %] 28 % PEEP:  [5 cmH20] 5 cmH20 Pressure Support:  [10 cmH20] 10 cmH20 Plateau Pressure:  [  20 cmH20-22 cmH20] 22 cmH20   Intake/Output Summary (Last 24 hours) at 02/21/2021 1539 Last data filed at 02/21/2021 0900 Gross per 24 hour  Intake 528.16 ml  Output 200 ml  Net 328.16 ml   Filed Weights   02/20/21 0922 02/21/21 0500  Weight: 73.5 kg 76.7 kg   Physical  Examination  GENERAL: 80 year-old critically ill patient lying in the bed with no acute distress.  EYES: Pupils equal, round, reactive to light and accommodation. No scleral icterus. Extraocular muscles intact.  HEENT: Head atraumatic, normocephalic. Oropharynx and nasopharynx clear.  NECK:  Trachea midline, on trach collar.  Neck Supple, no jugular venous distention. No thyroid enlargement, no tenderness.  LUNGS: Normal breath sounds bilaterally, no wheezing, rales,rhonchi or crepitation. No use of accessory muscles of respiration.  CARDIOVASCULAR: S1, S2 normal. No murmurs, rubs, or gallops.  ABDOMEN: Soft, nontender, nondistended. Bowel sounds present. No organomegaly or mass.  Mild irritation around the tube insertion site with minimal erythema and leakage. EXTREMITIES: No pedal edema, cyanosis, or clubbing.  NEUROLOGIC: Cranial nerves II through XII are intact.  Muscle strength 5/5 in all extremities. Sensation intact. Gait not checked.  PSYCHIATRIC: The patient is alert and oriented x 3. Nods appropriately to orientation questions, SKIN: No obvious rash, lesion, or ulcer.   Labs/imaging that I havepersonally reviewed  (right click and "Reselect all SmartList Selections" daily)     Labs   CBC: Recent Labs  Lab 02/20/21 1137 02/21/21 0507  WBC 10.0 6.8  NEUTROABS 8.9*  --   HGB 12.5 11.1*  HCT 38.8 34.8*  MCV 84.7 84.7  PLT 130* 113*     Basic Metabolic Panel: Recent Labs  Lab 02/20/21 1137 02/21/21 0507  NA 134* 134*  K 3.9 3.4*  CL 102 103  CO2 22 23  GLUCOSE 217* 251*  BUN 18 14  CREATININE 0.94 0.84  CALCIUM 9.2 8.3*    GFR: Estimated Creatinine Clearance: 55.3 mL/min (by C-G formula based on SCr of 0.84 mg/dL). Recent Labs  Lab 02/20/21 1137 02/21/21 0507 02/21/21 0714  PROCALCITON  --  0.17  --   WBC 10.0 6.8  --   LATICACIDVEN 2.5* 2.6* 3.2*     Liver Function Tests: Recent Labs  Lab 02/20/21 1137  AST 59*  ALT 41  ALKPHOS 69  BILITOT 0.9   PROT 7.7  ALBUMIN 3.9    No results for input(s): LIPASE, AMYLASE in the last 168 hours. No results for input(s): AMMONIA in the last 168 hours.  ABG    Component Value Date/Time   PHART 7.44 02/03/2020 1055   PCO2ART 53 (H) 02/03/2020 1055   PO2ART 92 02/03/2020 1055   HCO3 36.0 (H) 02/03/2020 1055   ACIDBASEDEF 0.9 01/19/2020 1024   O2SAT 97.4 02/03/2020 1055      Coagulation Profile: Recent Labs  Lab 02/20/21 1423  INR 1.1     Cardiac Enzymes: No results for input(s): CKTOTAL, CKMB, CKMBINDEX, TROPONINI in the last 168 hours.  HbA1C: Hgb A1c MFr Bld  Date/Time Value Ref Range Status  01/20/2020 07:19 AM 6.3 (H) 4.8 - 5.6 % Final    Comment:    (NOTE) Pre diabetes:          5.7%-6.4%  Diabetes:              >6.4%  Glycemic control for   <7.0% adults with diabetes   03/21/2019 03:24 AM 6.6 (H) 4.8 - 5.6 % Final    Comment:    (  NOTE) Pre diabetes:          5.7%-6.4% Diabetes:              >6.4% Glycemic control for   <7.0% adults with diabetes     CBG: Recent Labs  Lab 02/21/21 0412 02/21/21 0849 02/21/21 1137 02/21/21 1532  GLUCAP 237* 205* 146* 187*    Review of Systems:   Patient is trached unable to obtain ROS  Past Medical History  She,  has a past medical history of Breast cancer, right ([place]) (2002), Chronic respiratory failure ([place]), Diabetic peripheral neuropathy ([place]) (08/28/2016), GERD (gastroesophageal reflux disease), Glaucoma, High cholesterol, History of colon polyps, Osteoarthritis, Personal history of chemotherapy (2002), Personal history of radiation therapy (2002), S/P percutaneous endoscopic gastrostomy (PEG) tube placement ([place]), T2DM (type 2 diabetes mellitus) ([place]), Tracheostomy dependent ([place]) (2012), Ventral hernia, and Vocal cord paralysis (2012).   Surgical History    Past Surgical History:  Procedure Laterality Date   ABDOMINAL HYSTERECTOMY     BREAST BIOPSY Right 2008   neg   BREAST BIOPSY Right 06/30/2020    "Q" clip-path pending   BREAST EXCISIONAL BIOPSY Right 2002   positive   BREAST LUMPECTOMY Right 2002   chemo and radiation   BREAST LUMPECTOMY WITH RADIOFREQUENCY TAG IDENTIFICATION Right 10/18/2020   Procedure: BREAST LUMPECTOMY WITH RADIOFREQUENCY TAG IDENTIFICATION;  Surgeon: Jules Husbands, MD;  Location: [facility] ORS;  Service: General;  Laterality: Right;   BREAST SURGERY Right 2002   LUMPECTOMY, radiation, chemotherapy   CATARACT EXTRACTION, BILATERAL     CHOLECYSTECTOMY N/A 12/10/2017   Procedure: LAPAROSCOPIC CHOLECYSTECTOMY;  Surgeon: Benjamine Sprague, DO;  Location: [facility];  Service: General;  Laterality: N/A;   COLON RESECTION Right 11/06/2018   Procedure: HAND ASSISTED LAPAROSCOPIC COLON RESECTION, RIGHT;  Surgeon: Jules Husbands, MD;  Location: [facility];  Service: General;  Laterality: Right;   COLONOSCOPY  2009,12/30/2013   COLONOSCOPY WITH PROPOFOL N/A 04/16/2018   Tubulovillous polyp proximal transverse colon, large sessile polyp, proximal transverse colon;  Surgeon: Robert Bellow, MD;  Location: [facility];  Service: Endoscopy;  Laterality: N/A;   ESOPHAGOGASTRODUODENOSCOPY (EGD) WITH PROPOFOL N/A 10/14/2018   Procedure: ESOPHAGOGASTRODUODENOSCOPY (EGD) WITH PROPOFOL;  Surgeon: Lucilla Lame, MD;  Location: [facility];  Service: Endoscopy;  Laterality: N/A;   TRACHEAL SURGERY  2013   TRACHEOSTOMY N/A    Dr Kathyrn Sheriff   XI ROBOTIC ASSISTED VENTRAL HERNIA N/A 10/18/2020   Procedure: XI ROBOTIC ASSISTED VENTRAL HERNIA;  Surgeon: Jules Husbands, MD;  Location: [facility];  Service: General;  Laterality: N/A;     Social History   reports that she has never smoked. She has never used smokeless tobacco. She reports that she does not drink alcohol and does not use drugs.   Family History   Her family history includes Cancer in her brother, father, and sister. There is no history of Breast cancer.   Allergies Allergies  Allergen Reactions   Shellfish Allergy Shortness Of  Breath    respitatory distress    Sodium Sulfite Shortness Of Breath    Severe Congestion that creates inability to breath   Sulfa Antibiotics Shortness Of Breath and Other (See Comments)    respitatory distress   Codeine Other (See Comments)    Per patient "it kept her up all night"   Glucerna [Alimentum] Itching   Wound Dressing Adhesive Rash     Home Medications  Prior to Admission medications   Medication Sig Start Date End Date Taking? Authorizing  Provider  acetaminophen (TYLENOL) 500 MG tablet Take 1,000 mg by mouth in the morning and at bedtime.   Yes [provider]  amoxicillin-clavulanate (AUGMENTIN) 875-125 MG tablet Take 1 tablet by mouth in the morning and at bedtime. 02/18/21 02/25/21 Yes [provider]  busPIRone (BUSPAR) 15 MG tablet Place 15 mg into feeding tube 2 (two) times daily. 09/26/20  Yes [provider]  carboxymethylcellulose (REFRESH PLUS) 0.5 % SOLN Place 1 drop into both eyes 3 (three) times daily as needed (dry eyes).   Yes [provider]  denosumab (PROLIA) 60 MG/ML SOSY injection Inject 60 mg into the skin every 6 (six) months.   Yes [provider]  EPINEPHrine 0.3 mg/0.3 mL IJ SOAJ injection epinephrine 0.3 mg/0.3 mL injection, auto-injector  Inject 0.3 mLs (0.3 mg dose) into the muscle once as needed for Anaphylaxis for up to 1 dose. 09/10/18  Yes [provider]  escitalopram (LEXAPRO) 10 MG tablet Place 3 tablets (30 mg total) into feeding tube daily. Patient taking differently: Place 10 mg into feeding tube daily. 02/13/20  Yes Kasa, Maretta Bees, MD  escitalopram (LEXAPRO) 20 MG tablet Place 20 mg into feeding tube daily.   Yes [provider]  folic acid (FOLVITE) 1 MG tablet Place 1 tablet (1 mg total) into feeding tube daily. 02/13/20  Yes Flora Lipps, MD  gabapentin (NEURONTIN) 600 MG tablet Place 600 mg into feeding tube 3 (three) times daily.   Yes [provider]  lovastatin  (MEVACOR) 20 MG tablet Place 20 mg into feeding tube daily. 06/13/20  Yes [provider]  meclizine (ANTIVERT) 25 MG tablet Take 25 mg by mouth daily. 02/17/21  Yes [provider]  melatonin 5 MG TABS Place 1 tablet (5 mg total) into feeding tube at bedtime. 02/13/20  Yes Flora Lipps, MD  metFORMIN (GLUCOPHAGE) 1000 MG tablet Place 1,000 mg into feeding tube daily with breakfast. 06/10/20  Yes [provider]  metoprolol tartrate (LOPRESSOR) 25 MG tablet Place 25 mg into feeding tube daily. 06/13/20  Yes [provider]  Multiple Vitamins-Minerals (HAIR/SKIN/NAILS) TABS Take 1 tablet by mouth daily.   Yes [provider]  [facility] powder Apply topically. 01/18/21  Yes [provider]  pantoprazole (PROTONIX) 40 MG tablet Take 40 mg by mouth daily.   Yes [provider]  rOPINIRole (REQUIP) 0.5 MG tablet Place 1 tablet (0.5 mg total) into feeding tube at bedtime. 02/13/20  Yes Kasa, Maretta Bees, MD  sodium chloride 0.9 % nebulizer solution sodium chloride 0.9 % for nebulization  USE 5 ML BY NEBULIZTION AS NEEDED FOR WHEEZING 07/17/19  Yes [provider]  Scheduled Meds:  busPIRone  15 mg Per Tube BID   Chlorhexidine Gluconate Cloth  6 each Topical Q0600   escitalopram  30 mg Per Tube Daily   feeding supplement (KATE [employer]ARMS STANDARD 1.4)  325 mL Per Tube QID   folic acid  1 mg Per Tube Daily   free water  100 mL Per Tube QID   gabapentin  600 mg Per Tube TID   heparin  5,000 Units Subcutaneous Q8H   insulin aspart  0-15 Units Subcutaneous Q4H   ipratropium-albuterol  3 mL Nebulization Q6H   meclizine  25 mg Per Tube Daily   mouth rinse  15 mL Mouth Rinse q12n4p   melatonin  5 mg Per Tube QHS   metoprolol tartrate  25 mg Per Tube Daily   pantoprazole sodium  40 mg Per Tube Daily  pravastatin  20 mg Per Tube q1800   rOPINIRole  0.5 mg Per Tube QHS   Continuous Infusions:  ceFEPime (MAXIPIME) IV 2 g (02/21/21 1420)    vancomycin     PRN Meds:.ALPRAZolam, ipratropium-albuterol  Assessment & Plan:  Acute  on Chronic Hypoxic & Respiratory Failure Secondary to suspected aspiration pneumonia in the setting of 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 Hx: Tracheal stenosis -Supplemental O2 as needed to maintain O2 sats >92% -Trach collar during the day with vent qhs (tolerating), doing well -Follow intermittent CXR & ABG as needed -VAP protocol -Ensure adequate pulmonary hygiene  -Budesonide inhaler nebs BID, Bronchodilators PRN -Hold sedation/analgesia for RASS 0  Sepsis due to suspected Abdominal Abscess and Probable Pneumonia Met sepsis criteria Lactic: 2.5, Baseline PCT: pending, UA: Pyuria no evidence of UTI, CXR: No active cardiopulmonary disease, CT: Possible Abdominal wall abscess?  -F/u cultures, trend lactic/ PCT -Monitor WBC/ fever curve -IV antibiotics: cefepime & vancomycin -IVF hydration as needed -Pressors for MAP goal >65 -Strict I/O's  Possible subcutaneous periumbilical abscess CT Abdomen/Pelvis shows shows a potential small focal subcutaneous fluid collection to the left of the umbilicus measuring 2.4 x 1.5 x 1.2 cm concerning for possible abscess with G-tube is being pulled too tightly. -Keep NPO -General surgery consult  Diabetes mellitus      -CBG's -SSI and Lantus -Follow ICU Hypo/hyperglycemia protocol    Nutritional Status/Diet -NPO for now pending PEG patency evaluation -Will start trickle TF per PEG once General Surgery ok to use -Dietician following, TF's as tolerated -Constipation protocol as indicated    Electrolytes -follow labs as needed -replace as needed -pharmacy consultation and following    Best practice:  Diet:  Tube Feed  Pain/Anxiety/Delirium protocol (if indicated): No VAP protocol (if indicated): Yes DVT prophylaxis: Subcutaneous Heparin GI prophylaxis: PPI Glucose control:  SSI Yes Central venous access:  N/A Arterial line:  N/A Foley:  N/A Mobility:   bed rest  PT consulted: N/A Last date of multidisciplinary goals of care discussion [12/26] Code Status:  limited Disposition: Step down   = Goals of Care = Code Status Order: PARTIAL  Primary Emergency Contact: Tinner,Joseph, Home Phone: [phone] Patient wishes to pursue ongoing treatment with relatively conservative measures (e.g., IV fluids, antibiotics, Defibrillation, Intubation, ACLS meds), but in the event of cardiac or respiratory arrest would not wish to have CPR.  Critical care time: 45 minutes     .

## 2021-02-21 NOTE — Progress Notes (Signed)
Inpatient Diabetes Program Recommendations  AACE/ADA: New Consensus Statement on Inpatient Glycemic Control   Target Ranges:  Prepandial:   less than 140 mg/dL      Peak postprandial:   less than 180 mg/dL (1-2 hours)      Critically ill patients:  140 - 180 mg/dL    Latest Reference Range & Units 02/21/21 04:12 02/21/21 08:49  Glucose-Capillary 70 - 99 mg/dL 237 (H) 205 (H)   Review of Glycemic Control  Diabetes history: DM2 Outpatient Diabetes medications: None Current orders for Inpatient glycemic control: Novolog 0-15 units TID with meals; Jevity @ 10 ml/hr  Inpatient Diabetes Program Recommendations:    Insulin: Patient is NPO and ordered Jevity @ 10 ml/hr. Please change frequency of CBGs to Q4H and Novolog to 0-15 units Q4H.  Thanks, Barnie Alderman, RN, MSN, CDE Diabetes Coordinator Inpatient Diabetes Program [phone] (Team Pager from 8am to 5pm)

## 2021-02-21 NOTE — Plan of Care (Signed)
°  Problem: Clinical Measurements: Goal: Ability to maintain clinical measurements within normal limits will improve Outcome: Progressing Goal: Will remain free from infection Outcome: Progressing Goal: Respiratory complications will improve Outcome: Progressing   Problem: Activity: Goal: Risk for activity intolerance will decrease Outcome: Progressing   Problem: Coping: Goal: Level of anxiety will decrease Outcome: Progressing

## 2021-02-22 ENCOUNTER — Inpatient Hospital Stay: Payer: Medicare Other

## 2021-02-22 DIAGNOSIS — J810 Acute pulmonary edema: Secondary | ICD-10-CM | POA: Diagnosis not present

## 2021-02-22 DIAGNOSIS — J9601 Acute respiratory failure with hypoxia: Secondary | ICD-10-CM | POA: Diagnosis not present

## 2021-02-22 DIAGNOSIS — R400 Somnolence: Secondary | ICD-10-CM | POA: Diagnosis not present

## 2021-02-22 DIAGNOSIS — A419 Sepsis, unspecified organism: Secondary | ICD-10-CM | POA: Diagnosis not present

## 2021-02-22 LAB — GLUCOSE, CAPILLARY
Glucose-Capillary: 150 mg/dL — ABNORMAL HIGH (ref 70–99)
Glucose-Capillary: 210 mg/dL — ABNORMAL HIGH (ref 70–99)
Glucose-Capillary: 314 mg/dL — ABNORMAL HIGH (ref 70–99)
Glucose-Capillary: 249 mg/dL — ABNORMAL HIGH (ref 70–99)
Glucose-Capillary: 320 mg/dL — ABNORMAL HIGH (ref 70–99)
Glucose-Capillary: 371 mg/dL — ABNORMAL HIGH (ref 70–99)
Glucose-Capillary: 403 mg/dL — ABNORMAL HIGH (ref 70–99)

## 2021-02-22 LAB — CBC
HCT: 38.2 % (ref 36.0–46.0)
Hemoglobin: 12.5 g/dL (ref 12.0–15.0)
MCH: 27.8 pg (ref 26.0–34.0)
MCHC: 32.7 g/dL (ref 30.0–36.0)
MCV: 85.1 fL (ref 80.0–100.0)
Platelets: 115 10*3/uL — ABNORMAL LOW (ref 150–400)
RBC: 4.49 MIL/uL (ref 3.87–5.11)
RDW: 14.5 % (ref 11.5–15.5)
WBC: 5.8 10*3/uL (ref 4.0–10.5)
nRBC: 0 % (ref 0.0–0.2)

## 2021-02-22 LAB — PHOSPHORUS: Phosphorus: 1.6 mg/dL — ABNORMAL LOW (ref 2.5–4.6)

## 2021-02-22 LAB — LEGIONELLA PNEUMOPHILA SEROGP 1 UR AG: L. pneumophila Serogp 1 Ur Ag: NEGATIVE

## 2021-02-22 LAB — BASIC METABOLIC PANEL
Anion gap: 8 (ref 5–15)
BUN: 13 mg/dL (ref 8–23)
CO2: 24 mmol/L (ref 22–32)
Calcium: 8.8 mg/dL — ABNORMAL LOW (ref 8.9–10.3)
Chloride: 102 mmol/L (ref 98–111)
Creatinine, Ser: 0.71 mg/dL (ref 0.44–1.00)
GFR, Estimated: >60 mL/min (ref >60)
Glucose, Bld: 159 mg/dL — ABNORMAL HIGH (ref 70–99)
Potassium: 4.3 mmol/L (ref 3.5–5.1)
Sodium: 134 mmol/L — ABNORMAL LOW (ref 135–145)

## 2021-02-22 LAB — MAGNESIUM: Magnesium: 1.9 mg/dL (ref 1.7–2.4)

## 2021-02-22 LAB — LACTIC ACID, PLASMA: Lactic Acid, Venous: 1.7 mmol/L (ref 0.5–1.9)

## 2021-02-22 LAB — PROCALCITONIN: Procalcitonin: 0.21 ng/mL

## 2021-02-22 MED ORDER — ACETAMINOPHEN 325 MG PO TABS
650.0000 mg | ORAL_TABLET | Freq: Four times a day (QID) | ORAL | Status: DC | PRN
  Administered 2021-02-22 – 2021-02-26 (×8): 650 mg via ORAL
  Filled 2021-02-22 (×8): qty 2

## 2021-02-22 MED ORDER — METOPROLOL TARTRATE 5 MG/5ML IV SOLN
2.5000 mg | Freq: Once | INTRAVENOUS | Status: AC
  Administered 2021-02-22: 11:00:00 2.5 mg via INTRAVENOUS
  Filled 2021-02-22: qty 5

## 2021-02-22 MED ORDER — VANCOMYCIN HCL 1250 MG/250ML IV SOLN
1250.0000 mg | INTRAVENOUS | Status: DC
Start: 2021-02-22 — End: 2021-02-24
  Administered 2021-02-22 – 2021-02-23 (×2): 1250 mg via INTRAVENOUS
  Filled 2021-02-22 (×3): qty 250

## 2021-02-22 MED ORDER — K PHOS MONO-SOD PHOS DI & MONO 155-852-130 MG PO TABS
500.0000 mg | ORAL_TABLET | ORAL | Status: AC
  Administered 2021-02-22 (×4): 500 mg
  Filled 2021-02-22 (×4): qty 2

## 2021-02-22 MED ORDER — FUROSEMIDE 10 MG/ML IJ SOLN
20.0000 mg | Freq: Three times a day (TID) | INTRAMUSCULAR | Status: AC
  Administered 2021-02-22 (×2): 20 mg via INTRAVENOUS
  Filled 2021-02-22 (×2): qty 2

## 2021-02-22 MED ORDER — HYDROMORPHONE HCL 1 MG/ML IJ SOLN
0.5000 mg | Freq: Once | INTRAMUSCULAR | Status: AC
  Administered 2021-02-22: 11:00:00 0.5 mg via INTRAVENOUS
  Filled 2021-02-22: qty 1

## 2021-02-22 MED ORDER — METOPROLOL TARTRATE 5 MG/5ML IV SOLN
2.5000 mg | INTRAVENOUS | Status: DC | PRN
  Administered 2021-02-22 – 2021-02-24 (×2): 2.5 mg via INTRAVENOUS
  Filled 2021-02-22 (×2): qty 5

## 2021-02-22 MED ORDER — FENTANYL CITRATE (PF) 100 MCG/2ML IJ SOLN
100.0000 ug | Freq: Once | INTRAMUSCULAR | Status: DC
  Filled 2021-02-22: qty 2

## 2021-02-22 MED ORDER — INSULIN ASPART 100 UNIT/ML IJ SOLN
4.0000 [IU] | Freq: Four times a day (QID) | INTRAMUSCULAR | Status: DC
  Administered 2021-02-22 (×3): 4 [IU] via SUBCUTANEOUS
  Filled 2021-02-22 (×3): qty 1

## 2021-02-22 NOTE — Progress Notes (Signed)
Inpatient Diabetes Program Recommendations  AACE/ADA: New Consensus Statement on Inpatient Glycemic Control (2015)  Target Ranges:  Prepandial:   less than 140 mg/dL      Peak postprandial:   less than 180 mg/dL (1-2 hours)      Critically ill patients:  140 - 180 mg/dL    Latest Reference Range & Units 02/22/21 03:21 02/22/21 08:09 02/22/21 11:39  Glucose-Capillary 70 - 99 mg/dL 150 (H) 210 (H) 314 (H)    Latest Reference Range & Units 02/21/21 11:37 02/21/21 15:32 02/21/21 19:25 02/21/21 19:38 02/21/21 23:33  Glucose-Capillary 70 - 99 mg/dL 146 (H) 187 (H) 170 (H) 125 (H) 145 (H)   Review of Glycemic Control  Diabetes history: DM2 Outpatient Diabetes medications: Metformin 1000 mg daily Current orders for Inpatient glycemic control: Novolog 0-15 units Q4H; Tube feeding 325 ml QID  Inpatient Diabetes Program Recommendations:    Insulin: Please consider ordering Novolog 4 units QID (scheduled with tube feedings - 10:00, 14:00, 18:00, and 22:00) for tube feeding coverage. If tube feeding is stopped or held then Novolog tube feeding coverage should also be stopped or held.  Thanks, Barnie Alderman, RN, MSN, CDE Diabetes Coordinator Inpatient Diabetes Program [phone] (Team Pager from 8am to 5pm)

## 2021-02-22 NOTE — Consult Note (Signed)
PHARMACY CONSULT NOTE - FOLLOW UP  Pharmacy Consult for Electrolyte Monitoring and Replacement   Recent Labs: Potassium (mmol/L)  Date Value  02/22/2021 4.3  12/17/2011 4.1   Magnesium (mg/dL)  Date Value  02/22/2021 1.9  12/17/2011 2.2   Calcium (mg/dL)  Date Value  02/22/2021 8.8 (L)   Calcium, Total (mg/dL)  Date Value  12/17/2011 8.8   Albumin (g/dL)  Date Value  02/20/2021 3.9  08/28/2016 4.3  12/16/2011 4.1   Phosphorus (mg/dL)  Date Value  02/22/2021 1.6 (L)  12/16/2011 2.2 (L)   Sodium (mmol/L)  Date Value  02/22/2021 134 (L)  08/28/2016 143  12/17/2011 139     Assessment: 80 y.o with significant PMH of remote breast cancer in 2002 s/p chemo and radiation, DM2, ventral hernia s/p robotic incisional hernia repair and right upper outer quadrant mass s/p RFA guided right lumpectomy (09/2020), peripheral neuropathy, GERD, 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 Pharmacy has been consulted for electrolyte monitoring and replacement.   Nutrition [employer] standard TF 368mL QID Free water 137mL QID  Labs Na 134>134 K 3.4>4.3 Phos 1.6 Mg 1.9  Goal of Therapy:  Electrolytes WNL  Plan:  --Will order K phos neutral 500mg  (contains 10mmol phos and 2.42mEq K) q4h x4 doses --No other replacement is currently indicated  --Will continue to monitor and replace electrolytes as clinically indicated   Narda Rutherford, PharmD Pharmacy Resident  02/22/2021 7:27 AM

## 2021-02-22 NOTE — Progress Notes (Signed)
Patient agitated this am. Requested xanax with no effectiveness. Patients heart rate 130's. Dr Merrilee Jansky into see patient. Dilaudid ordered and bronch done at bedside. No mucus plug found. Patient became lethargic, CT head and chest ordered. Patients husband into see patient late afternoon and opening eyes.

## 2021-02-22 NOTE — Progress Notes (Signed)
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  Reported as high as 101.0 with associated shortness of breath and dry cough.  Also with generalized abdominal pain.  Denies urinary complaints.  In the ED, temperature 101.5 F, BP 126/65, HR 111, RR 27, SPO2 87% room air.  CT abdomen/pelvis with potential small focus subcutaneous fluid collection left of the umbilicus measuring 2.4 x 1.5 x 1.2 cm concerning for possible abscess.  General surgery consulted for further evaluation.  Patient now with also O2 requirement with possible pneumonia but no infiltrates on x-ray.  PCCM was consulted for assistance with vent management.  Patient was started on broad-spectrum antibiotics and admitted to the hospitalist service.    Assessment & Plan:   Principal Problem:   Sepsis ([place])   Acute on chronic hypoxic respiratory failure secondary to suspected aspiration pneumonia with history of chronic tracheostomy and tracheal stenosis Patient presenting to ED with shortness of breath, fever. --Chest x-ray today with increased vascular congestion concerning for pulmonary edema --CT chest without contrast with moderate size right pleural effusion with overlying atelectasis, peribronchial thickening and bilateral lower lobe airspace consolidation likely pneumonia, from aspiration. --Blood cultures 12/28: Pending --Vancomycin pharmacy consulted for dosing/monitoring --Cefepime 2 g IV daily 12 hours --Continue trach collar during the day if able and vent  nightly --Furosemide 20 mg IV every 8 hours --Strict I's and O's and daily weights  Sepsis, POA suspected abdominal/subcutaneous periumbilical abscess CT abdomen/pelvis shows a potential small focal subcutaneous fluid collection to the left of the umbilicus measuring 2.4 x 1.5 x 1.2 cm concerning for possible abscess with a G-tube being pulled too tightly.  Bedside specific arterial on admission with tachycardia, leukocytosis, fevers with likely source of pneumonia versus abdominal abscess. --Seen by general surgery, no intervention at this time is believes that this area of concern is a remnant of hernia sac versus postoperative seroma from her hernia repair.  Rather than abscess/infection. --Continue vancomycin and cefepime  Type 2 diabetes mellitus Holding home Glucophage. --NovoLog 4 units [place] QID --SSI for coverage --CBG every 4 hours  Essential hypertension --Metoprolol 25 mg daily  GERD: Protonix 40 mg daily  Restless leg syndrome: Ropinirole 0.5 mg daily  Depression/anxiety: --Lexapro 30 mg daily --BuSpar  Hyperlipidemia: Pravastatin 20 mg daily  Hypophosphatemia --K-Phos 500 mg every 4 hours x4 doses     DVT prophylaxis: heparin injection 5,000 Units Start: 02/20/21 2200   Code Status: Partial Code Family Communication: No family present at bedside this morning  Disposition Plan:  Level of care: ICU Status is: Inpatient  Remains inpatient appropriate because: Remains dependent dependent during the day with worsening respiratory status, started on IV Lasix.   Consultants:  PCCM General surgery - signed off 12/27  Procedures:  None  Antimicrobials:  Vancomycin 12/26>> Cefepime 12/26>> Metronidazole 12/26 - 12/26    Subjective: Patient seen examined bedside, mild respiratory distress.  Placed back on vent by respiratory therapy this morning due to worsening respiratory status.  No family present at bedside.  Chest x-ray with worsening vascular  congestion and CT chest with concern for aspiration pneumonia in the right.  Recurrent fever today.  Repeating blood cultures.  Objective: Vitals:   02/22/21 1600 02/22/21 1700 02/22/21 1730 02/22/21 1800  BP: (!) 143/85 (!) 151/104  (!) 144/91  Pulse: (!) 119 (!) 116  (!) 122  Resp: 17 (!) 21  19  Temp:   (!) 100.8 F (38.2 C)   TempSrc:      SpO2: 95% 90%  95%  Weight:      Height:        Intake/Output Summary (Last 24 hours) at 02/22/2021 1852 Last data filed at 02/22/2021 1844 Gross per 24 hour  Intake 450 ml  Output 1650 ml  Net -1200 ml   Filed Weights   02/20/21 0922 02/21/21 0500 02/22/21 0500  Weight: 73.5 kg 76.7 kg 76.4 kg    Examination:  General exam: Mild respiratory distress, on vent, chronically ill in appearance Respiratory system: Decreased breath sounds bilateral bases, with crackles, increased respiratory effort without accessory muscle use, on vent with FiO2 40% Cardiovascular system: S1 & S2 heard, tachycardic, regular rhythm. No JVD, murmurs, rubs, gallops or clicks. No pedal edema. Gastrointestinal system: Abdomen is nondistended, soft and nontender. No organomegaly or masses felt. Normal bowel sounds heard. Central nervous system: Alert. No focal neurological deficits. Extremities: Moves all extremities independently Skin: No rashes, lesions or ulcers Psychiatry: Judgement and insight appear poor. Mood & affect appropriate.     Data Reviewed: I have personally reviewed following labs and imaging studies  CBC: Recent Labs  Lab 02/20/21 1137 02/21/21 0507 02/22/21 0413  WBC 10.0 6.8 5.8  NEUTROABS 8.9*  --   --   HGB 12.5 11.1* 12.5  HCT 38.8 34.8* 38.2  MCV 84.7 84.7 85.1  PLT 130* 113* 622*   Basic Metabolic Panel: Recent Labs  Lab 02/20/21 1137 02/21/21 0507 02/22/21 0413  NA 134* 134* 134*  K 3.9 3.4* 4.3  CL 102 103 102  CO2 22 23 24   GLUCOSE 217* 251* 159*  BUN 18 14 13   CREATININE 0.94 0.84 0.71  CALCIUM 9.2 8.3* 8.8*   MG  --   --  1.9  PHOS  --   --  1.6*   GFR: Estimated Creatinine Clearance: 58 mL/min (by C-G formula based on SCr of 0.71 mg/dL). Liver Function Tests: Recent Labs  Lab 02/20/21 1137  AST 59*  ALT 41  ALKPHOS 69  BILITOT 0.9  PROT 7.7  ALBUMIN 3.9   No results for input(s): LIPASE, AMYLASE in the last 168 hours. No results for input(s): AMMONIA in the last 168 hours. Coagulation Profile: Recent Labs  Lab 02/20/21 1423  INR 1.1   Cardiac Enzymes: No results for input(s): CKTOTAL, CKMB, CKMBINDEX, TROPONINI in the last 168 hours. BNP (last 3 results) No results for input(s): PROBNP in the last 8760 hours. HbA1C: No results for input(s): HGBA1C in the last 72 hours. CBG: Recent Labs  Lab 02/22/21 0321 02/22/21 0809 02/22/21 1139 02/22/21 1531 02/22/21 1726  GLUCAP 150* 210* 314* 249* 320*   Lipid Profile: No results for input(s): CHOL, HDL, LDLCALC, TRIG, CHOLHDL, LDLDIRECT in the last 72 hours. Thyroid Function Tests: No results for input(s): TSH, T4TOTAL, FREET4, T3FREE, THYROIDAB in the last 72 hours. Anemia Panel: No results for input(s): VITAMINB12, FOLATE, FERRITIN, TIBC, IRON, RETICCTPCT in the last 72 hours. Sepsis Labs: Recent Labs  Lab 02/20/21 1137 02/21/21 0507 02/21/21 0714 02/22/21 0413  PROCALCITON  --  0.17  --  0.21  LATICACIDVEN 2.5* 2.6* 3.2*  --     Recent  Results (from the past 240 hour(s))  Blood culture (routine x 2)     Status: None (Preliminary result)   Collection Time: 02/20/21 11:37 AM   Specimen: BLOOD  Result Value Ref Range Status   Specimen Description BLOOD BLOOD LEFT FOREARM  Final   Special Requests   Final    BOTTLES DRAWN AEROBIC AND ANAEROBIC Blood Culture results may not be optimal due to an excessive volume of blood received in culture bottles   Culture   Final    NO GROWTH 2 DAYS Performed at [facility], [address] [place], [place] [address]    Report Status PENDING  Incomplete  Resp Panel  by RT-PCR (Flu A&B, Covid)     Status: None   Collection Time: 02/20/21  2:23 PM   Specimen: Nasopharyngeal(NP) swabs in vial transport medium  Result Value Ref Range Status   SARS Coronavirus 2 by RT PCR NEGATIVE NEGATIVE Final    Comment: (NOTE) SARS-CoV-2 target nucleic acids are NOT DETECTED.  The SARS-CoV-2 RNA is generally detectable in upper respiratory specimens during the acute phase of infection. The lowest concentration of SARS-CoV-2 viral copies this assay can detect is 138 copies/mL. A negative result does not preclude SARS-Cov-2 infection and should not be used as the sole basis for treatment or other patient management decisions. A negative result may occur with  improper specimen collection/handling, submission of specimen other than nasopharyngeal swab, presence of viral mutation(s) within the areas targeted by this assay, and inadequate number of viral copies(<138 copies/mL). A negative result must be combined with clinical observations, patient history, and epidemiological information. The expected result is Negative.  Fact Sheet for Patients:  EntrepreneurPulse.com.au  Fact Sheet for Healthcare Providers:  IncredibleEmployment.be  This test is no t yet approved or cleared by the [place] FDA and  has been authorized for detection and/or diagnosis of SARS-CoV-2 by FDA under an Emergency Use Authorization (EUA). This EUA will remain  in effect (meaning this test can be used) for the duration of the COVID-19 declaration under Section 564(b)(1) of the Act, 21 U.S.C.section 360bbb-3(b)(1), unless the authorization is terminated  or revoked sooner.       Influenza A by PCR NEGATIVE NEGATIVE Final   Influenza B by PCR NEGATIVE NEGATIVE Final    Comment: (NOTE) The Xpert Xpress SARS-CoV-2/FLU/RSV plus assay is intended as an aid in the diagnosis of influenza from Nasopharyngeal swab specimens and should not be used as a  sole basis for treatment. Nasal washings and aspirates are unacceptable for Xpert Xpress SARS-CoV-2/FLU/RSV testing.  Fact Sheet for Patients: EntrepreneurPulse.com.au  Fact Sheet for Healthcare Providers: IncredibleEmployment.be  This test is not yet approved or cleared by the [place] FDA and has been authorized for detection and/or diagnosis of SARS-CoV-2 by FDA under an Emergency Use Authorization (EUA). This EUA will remain in effect (meaning this test can be used) for the duration of the COVID-19 declaration under Section 564(b)(1) of the Act, 21 U.S.C. section 360bbb-3(b)(1), unless the authorization is terminated or revoked.  Performed at [facility], [place], [place] [address]   Respiratory (~20 pathogens) panel by PCR     Status: None   Collection Time: 02/20/21  4:16 PM   Specimen: Nasopharyngeal Swab; Respiratory  Result Value Ref Range Status   Adenovirus NOT DETECTED NOT DETECTED Final   Coronavirus 229E NOT DETECTED NOT DETECTED Final    Comment: (NOTE) The Coronavirus on the Respiratory Panel, DOES NOT test for  the novel  Coronavirus (2019 nCoV)    Coronavirus HKU1 NOT DETECTED NOT DETECTED Final   Coronavirus NL63 NOT DETECTED NOT DETECTED Final   Coronavirus OC43 NOT DETECTED NOT DETECTED Final   Metapneumovirus NOT DETECTED NOT DETECTED Final   Rhinovirus / Enterovirus NOT DETECTED NOT DETECTED Final   Influenza A NOT DETECTED NOT DETECTED Final   Influenza B NOT DETECTED NOT DETECTED Final   Parainfluenza Virus 1 NOT DETECTED NOT DETECTED Final   Parainfluenza Virus 2 NOT DETECTED NOT DETECTED Final   Parainfluenza Virus 3 NOT DETECTED NOT DETECTED Final   Parainfluenza Virus 4 NOT DETECTED NOT DETECTED Final   Respiratory Syncytial Virus NOT DETECTED NOT DETECTED Final   Bordetella pertussis NOT DETECTED NOT DETECTED Final   Bordetella Parapertussis NOT DETECTED NOT DETECTED Final    Chlamydophila pneumoniae NOT DETECTED NOT DETECTED Final   Mycoplasma pneumoniae NOT DETECTED NOT DETECTED Final    Comment: Performed at [facility], [place] [address], [place], [place] [address]  MRSA Next Gen by PCR, Nasal     Status: None   Collection Time: 02/21/21  4:16 AM   Specimen: Nasal Mucosa; Nasal Swab  Result Value Ref Range Status   MRSA by PCR Next Gen NOT DETECTED NOT DETECTED Final    Comment: (NOTE) The GeneXpert MRSA Assay (FDA approved for NASAL specimens only), is one component of a comprehensive MRSA colonization surveillance program. It is not intended to diagnose MRSA infection nor to guide or monitor treatment for MRSA infections. Test performance is not FDA approved in patients less than 107 years old. Performed at [facility], [address] [place], [place] [address]   Culture, blood (Routine X 2) w Reflex to ID Panel     Status: None (Preliminary result)   Collection Time: 02/21/21  9:46 PM   Specimen: BLOOD  Result Value Ref Range Status   Specimen Description BLOOD LEFT HAND  Final   Special Requests   Final    BOTTLES DRAWN AEROBIC AND ANAEROBIC Blood Culture adequate volume   Culture   Final    NO GROWTH < 12 HOURS Performed at [facility], [address], [place], [place] [address]    Report Status PENDING  Incomplete         Radiology Studies: CT HEAD WO CONTRAST (5MM)  Result Date: 02/22/2021 CLINICAL DATA:  Mental status change. EXAM: CT HEAD WITHOUT CONTRAST TECHNIQUE: Contiguous axial images were obtained from the base of the skull through the vertex without intravenous contrast. COMPARISON:  Head CT 10/29/2020 FINDINGS: Brain: Stable age related cerebral atrophy, ventriculomegaly and advanced periventricular white matter disease. No extra-axial fluid collections are identified. No CT findings for acute hemispheric infarction or intracranial hemorrhage. No mass lesions. The brainstem and cerebellum are normal.  Vascular: Stable vascular calcifications. No aneurysm or hyperdense vessels. Skull: No acute skull fracture. Sinuses/Orbits: Paranasal sinuses and mastoid air cells are clear. The globes are intact. Other: No scalp lesions or scalp hematoma. IMPRESSION: 1. Stable age related cerebral atrophy, ventriculomegaly and advanced periventricular white matter disease. 2. No acute intracranial findings or mass lesions. Electronically Signed   By: Marijo Sanes M.D.   On: 02/22/2021 17:37   CT CHEST WO CONTRAST  Result Date: 02/22/2021 CLINICAL DATA:  Unresponsive. EXAM: CT CHEST WITHOUT CONTRAST TECHNIQUE: Multidetector CT imaging of the chest was performed following the standard protocol without IV contrast. COMPARISON:  Chest CT 01/17/2020 FINDINGS: Cardiovascular: The heart is normal in size. No pericardial  effusion. Stable tortuosity and calcification of the thoracic aorta. Stable coronary artery calcifications. Stable mitral valve annular calcifications. Mediastinum/Nodes: Borderline mediastinal and hilar lymph nodes likely inflammatory/reactive given the lung findings. Persistent markedly dilated esophagus suggesting achalasia. Air-fluid level noted in the lower half of the esophagus. Lungs/Pleura: Tracheostomy tube in good position. Moderate-sized right pleural effusion is noted. There is overlying atelectasis. Peribronchial thickening and bilateral lower lobe airspace consolidation likely pneumonia. Aspiration would certainly be a consideration given the appearance of the esophagus. No obvious pulmonary mass. Stable appearing right upper lobe pulmonary nodule measuring 5 mm on image number 35/4. Stable to slightly progressive right apical scarring changes. Upper Abdomen: No significant upper abdominal findings. Vascular calcifications are noted. Musculoskeletal: Chronic surgical and probable radiation changes involving the right breast with dense subareolar calcifications. Bone lesions most consistent with  treated metastatic disease. There also multiple healed rib fractures. No new bone lesions are identified. IMPRESSION: 1. Moderate-sized right pleural effusion with overlying atelectasis. 2. Peribronchial thickening and bilateral lower lobe airspace consolidation likely pneumonia. Aspiration would certainly be a consideration given the appearance of the esophagus. 3. Stable markedly dilated esophagus suggesting achalasia. 4. Stable scattered bone lesions and healed rib fractures. 5. Aortic atherosclerosis. Aortic Atherosclerosis (ICD10-I70.0). Electronically Signed   By: Marijo Sanes M.D.   On: 02/22/2021 17:50   DG Chest Port 1 View  Result Date: 02/22/2021 CLINICAL DATA:  Sepsis.  Shortness of breath. EXAM: PORTABLE CHEST 1 VIEW COMPARISON:  Chest x-ray from yesterday. FINDINGS: Unchanged tracheostomy tube. Normal heart size. Increasing diffuse interstitial opacities. Suspected new trace bilateral pleural effusions. No consolidation or pneumothorax. No acute osseous abnormality. IMPRESSION: 1. Increasing diffuse interstitial pulmonary edema and probable trace bilateral pleural effusions. Electronically Signed   By: Titus Dubin M.D.   On: 02/22/2021 12:35   DG Chest Port 1 View  Result Date: 02/21/2021 CLINICAL DATA:  Dyspnea R06.00 (ICD-10-CM) EXAM: PORTABLE CHEST 1 VIEW COMPARISON:  Multiple priors, most recent 02/20/2021. FINDINGS: Chronic coarsening of the interstitial markings with streaky areas of probable atelectasis. No confluent consolidation. No definite pleural effusions or visible pneumothorax. Biapical pleuroparenchymal scarring. Tracheostomy tube projects midline over the tracheal air column at the level of the clavicular heads. Cardiomediastinal silhouette is within normal limits. Calcific atherosclerosis of the aorta. Polyarticular degenerative change. IMPRESSION: Chronic coarsening of the interstitial markings with streaky areas of probable atelectasis. No confluent consolidation.  Electronically Signed   By: Margaretha Sheffield M.D.   On: 02/21/2021 05:04        Scheduled Meds:  Chlorhexidine Gluconate Cloth  6 each Topical Q0600   escitalopram  30 mg Per Tube Daily   feeding supplement (KATE FARMS STANDARD 1.4)  325 mL Per Tube QID   fentaNYL (SUBLIMAZE) injection  100 mcg Intravenous Once   folic acid  1 mg Per Tube Daily   free water  100 mL Per Tube QID   furosemide  20 mg Intravenous Q8H   heparin  5,000 Units Subcutaneous Q8H   insulin aspart  0-15 Units Subcutaneous Q4H   insulin aspart  4 Units Subcutaneous QID   ipratropium-albuterol  3 mL Nebulization Q6H   mouth rinse  15 mL Mouth Rinse q12n4p   metoprolol tartrate  25 mg Per Tube Daily   pantoprazole sodium  40 mg Per Tube Daily   phosphorus  500 mg Per Tube Q4H   pravastatin  20 mg Per Tube q1800   rOPINIRole  0.5 mg Per Tube QHS   Continuous Infusions:  ceFEPime (  MAXIPIME) IV Stopped (02/22/21 1513)   vancomycin       LOS: 2 days    Time spent: 52 minutes spent on chart review, discussion with nursing staff, consultants, updating family and interview/physical exam; more than 50% of that time was spent in counseling and/or coordination of care.    Dynesha Woolen J [place] DO [facility] Available via Epic secure chat 7am-7pm After these hours, please refer to coverage provider listed on [email or web address] 02/22/2021, 6:52 PM

## 2021-02-22 NOTE — Progress Notes (Signed)
NAME:  Angela David, MRN:  [id number], DOB:  04-02-1940, LOS: 2 ADMISSION DATE:  02/20/2021, CONSULTATION DATE:  02/20/2021 REFERRING MD: Imagene Sheller, MD. 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  Patient reports having some pain, irritation and drainage around the PEG site since Friday. Later she developed fever, chills, generalized  malaise and SOB. Patient was started on Augmentin for suspected cellulitis of  around PEG site with no improvement.  ED Course: On arrival to the ED, she was febrile 101.5 (38.6) with blood pressure 126/65 mm Hg and pulse rate 111 beats/min, RR 27 with sats 87% on RA. There were no focal neurological deficits;  Patient mildly tachycardic mildly tachypneic hypoxic with mild abdominal pain. Given recent treatment for cellulitis, CT imaging was ordered.  Pertinent Labs in Red/Diagnostics Findings: Na+/ K+: 134/3.9 Glucose: 217 BUN/Cr.: 18/0.94 AST/ALT:59/41   WBC/ TMAX: 10.0/ afebrile PCT: pending Lactic acid: 2.5 COVID PCR: Negative  Patient had a CT scan of the abdomen pelvis today which shows a potential small focal subcutaneous fluid collection to the left of the umbilicus measuring 2.4 x 1.5 x 1.2 cm concerning for possible abscess. Surgery consulted for further evaluation.  Patient also with O2 requirement and possibly developing pneumonia but no infiltrates on chest x-ray. Patient was started on broad spectrum antibiotics and admitted to hospitalist service. PCCM consulted to assist with vent management.   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 12/26: CTA abdomen and pelvis> shows a potential small focal subcutaneous fluid collection to the left of the umbilicus measuring 2.4 x 1.5 x 1.2 cm concerning for possible abscess  Micro Data:  12/26: SARS-CoV-2 PCR> negative 12/26: Influenza PCR> negative 12/26: Blood culture x2> 12/26: Urine Culture> 12/26: MRSA PCR>>  12/26: Strep pneumo urinary antigen> 12/26: Legionella urinary antigen> Antimicrobials:  12/26 metronidazole x 1 12/26 cefepime >>  12/26 vancomycin >>    OBJECTIVE  Blood pressure 136/77, pulse (!) 121, temperature (!) 100.5 F (38.1 C), resp. rate 19, height '5\' 4"'  (1.626 m), weight 76.4 kg, SpO2 95 %.    Vent Mode: PCV FiO2 (%):  [28 %-60 %] 40 % Set Rate:  [14 bmp] 14 bmp PEEP:  [5 cmH20-8 cmH20] 8 cmH20 Pressure Support:  [10 IOM35-59  cmH20] 17 cmH20   Intake/Output Summary (Last 24 hours) at 02/22/2021 1627 Last data filed at 02/22/2021 0900 Gross per 24 hour  Intake 850 ml  Output 1850 ml  Net -1000 ml    Filed Weights   02/20/21 0922 02/21/21 0500 02/22/21 0500  Weight: 73.5 kg 76.7 kg 76.4 kg   Physical Examination   GENERAL: 80 year-old critically ill patient lying in the bed with no acute distress.  EYES: Pupils equal, round, reactive to light and accommodation. No scleral icterus. Extraocular muscles intact.  HEENT: Head atraumatic, normocephalic. Oropharynx and nasopharynx clear.  NECK:  Trachea midline, on trach collar.  Neck Supple, no jugular venous distention. No thyroid enlargement, no tenderness.  LUNGS: Normal breath sounds bilaterally, no wheezing, rales,rhonchi or crepitation. No use of accessory muscles of respiration.  CARDIOVASCULAR: S1, S2 normal. No murmurs, rubs, or gallops.  ABDOMEN: Soft, nontender, nondistended. Bowel sounds present. No organomegaly or mass.  Mild irritation around the tube insertion site with minimal erythema and leakage. EXTREMITIES: No pedal edema, cyanosis, or clubbing.  NEUROLOGIC: Cranial nerves II through XII are intact.  Muscle strength 5/5 in all extremities. Sensation intact. Gait not checked.  PSYCHIATRIC: The patient is alert and oriented x 3. Nods appropriately to orientation questions, SKIN: No obvious rash, lesion, or ulcer.   Labs/imaging that I havepersonally reviewed  (right click and "Reselect all SmartList Selections" daily)     Labs   CBC: Recent Labs  Lab 02/20/21 1137 02/21/21 0507 02/22/21 0413  WBC 10.0 6.8 5.8  NEUTROABS 8.9*  --   --   HGB 12.5 11.1* 12.5  HCT 38.8 34.8* 38.2  MCV 84.7 84.7 85.1  PLT 130* 113* 115*     Basic Metabolic Panel: Recent Labs  Lab 02/20/21 1137 02/21/21 0507 02/22/21 0413  NA 134* 134* 134*  K 3.9 3.4* 4.3  CL 102 103 102  CO2 '22 23 24  ' GLUCOSE 217* 251* 159*  BUN '18 14 13  ' CREATININE 0.94 0.84 0.71  CALCIUM 9.2 8.3* 8.8*  MG  --   --  1.9  PHOS  --   --  1.6*    GFR: Estimated Creatinine Clearance: 58 mL/min (by C-G formula based on SCr of 0.71 mg/dL). Recent Labs  Lab 02/20/21 1137 02/21/21 0507 02/21/21 0714 02/22/21 0413  PROCALCITON  --  0.17  --  0.21  WBC 10.0 6.8  --   5.8  LATICACIDVEN 2.5* 2.6* 3.2*  --      Liver Function Tests: Recent Labs  Lab 02/20/21 1137  AST 59*  ALT 41  ALKPHOS 69  BILITOT 0.9  PROT 7.7  ALBUMIN 3.9    No results for input(s): LIPASE, AMYLASE in the last 168 hours. No results for input(s): AMMONIA in the last 168 hours.  ABG    Component Value Date/Time   PHART 7.44 02/03/2020 1055   PCO2ART 53 (H) 02/03/2020 1055   PO2ART 92 02/03/2020 1055   HCO3 36.0 (H) 02/03/2020 1055   ACIDBASEDEF 0.9 01/19/2020 1024   O2SAT 97.4 02/03/2020 1055      Coagulation Profile: Recent Labs  Lab 02/20/21 1423  INR 1.1     Cardiac Enzymes: No results for input(s): CKTOTAL, CKMB, CKMBINDEX, TROPONINI in the last 168 hours.  HbA1C: Hgb A1c MFr Bld  Date/Time Value Ref Range Status  01/20/2020 07:19 AM 6.3 (H) 4.8 - 5.6 % Final    Comment:    (NOTE) Pre diabetes:  5.7%-6.4%  Diabetes:              >6.4%  Glycemic control for   <7.0% adults with diabetes   03/21/2019 03:24 AM 6.6 (H) 4.8 - 5.6 % Final    Comment:    (NOTE) Pre diabetes:          5.7%-6.4% Diabetes:              >6.4% Glycemic control for   <7.0% adults with diabetes     CBG: Recent Labs  Lab 02/21/21 2333 02/22/21 0321 02/22/21 0809 02/22/21 1139 02/22/21 1531  GLUCAP 145* 150* 210* 314* 249*     Review of Systems:   Patient is trached unable to obtain ROS  Past Medical History  She,  has a past medical history of Breast cancer, right ([place]) (2002), Chronic respiratory failure ([place]), Diabetic peripheral neuropathy ([place]) (08/28/2016), GERD (gastroesophageal reflux disease), Glaucoma, High cholesterol, History of colon polyps, Osteoarthritis, Personal history of chemotherapy (2002), Personal history of radiation therapy (2002), S/P percutaneous endoscopic gastrostomy (PEG) tube placement ([place]), T2DM (type 2 diabetes mellitus) ([place]), Tracheostomy dependent ([place]) (2012), Ventral hernia, and Vocal cord paralysis (2012).    Surgical History    Past Surgical History:  Procedure Laterality Date   ABDOMINAL HYSTERECTOMY     BREAST BIOPSY Right 2008   neg   BREAST BIOPSY Right 06/30/2020   "Q" clip-path pending   BREAST EXCISIONAL BIOPSY Right 2002   positive   BREAST LUMPECTOMY Right 2002   chemo and radiation   BREAST LUMPECTOMY WITH RADIOFREQUENCY TAG IDENTIFICATION Right 10/18/2020   Procedure: BREAST LUMPECTOMY WITH RADIOFREQUENCY TAG IDENTIFICATION;  Surgeon: Jules Husbands, MD;  Location: [facility] ORS;  Service: General;  Laterality: Right;   BREAST SURGERY Right 2002   LUMPECTOMY, radiation, chemotherapy   CATARACT EXTRACTION, BILATERAL     CHOLECYSTECTOMY N/A 12/10/2017   Procedure: LAPAROSCOPIC CHOLECYSTECTOMY;  Surgeon: Benjamine Sprague, DO;  Location: [facility] OR[facility];  Service: General;  Laterality: N/A;   COLON RESECTION Right 11/06/2018   Procedure: HAND ASSISTED LAPAROSCOPIC COLON RESECTION, RIGHT;  Surgeon: Jules Husbands, MD;  Location: [facility];  Service: General;  Laterality: Right;   COLONOSCOPY  2009,12/30/2013   COLONOSCOPY WITH PROPOFOL N/A 04/16/2018   Tubulovillous polyp proximal transverse colon, large sessile polyp, proximal transverse colon;  Surgeon: Robert Bellow, MD;  Location: [facility];  Service: Endoscopy;  Laterality: N/A;   ESOPHAGOGASTRODUODENOSCOPY (EGD) WITH PROPOFOL N/A 10/14/2018   Procedure: ESOPHAGOGASTRODUODENOSCOPY (EGD) WITH PROPOFOL;  Surgeon: Lucilla Lame, MD;  Location: [facility];  Service: Endoscopy;  Laterality: N/A;   TRACHEAL SURGERY  2013   TRACHEOSTOMY N/A    Dr Kathyrn Sheriff   XI ROBOTIC ASSISTED VENTRAL HERNIA N/A 10/18/2020   Procedure: XI ROBOTIC ASSISTED VENTRAL HERNIA;  Surgeon: Jules Husbands, MD;  Location: [facility];  Service: General;  Laterality: N/A;     Social History   reports that she has never smoked. She has never used smokeless tobacco. She reports that she does not drink alcohol and does not use drugs.   Family History   Her family  history includes Cancer in her brother, father, and sister. There is no history of Breast cancer.   Allergies Allergies  Allergen Reactions   Shellfish Allergy Shortness Of Breath    respitatory distress    Sodium Sulfite Shortness Of Breath    Severe Congestion that creates inability to breath   Sulfa Antibiotics Shortness Of Breath and Other (See Comments)    respitatory  distress   Codeine Other (See Comments)    Per patient "it kept her up all night"   Glucerna [Alimentum] Itching   Wound Dressing Adhesive Rash     Home Medications  Prior to Admission medications   Medication Sig Start Date End Date Taking? Authorizing Provider  acetaminophen (TYLENOL) 500 MG tablet Take 1,000 mg by mouth in the morning and at bedtime.   Yes [provider]  amoxicillin-clavulanate (AUGMENTIN) 875-125 MG tablet Take 1 tablet by mouth in the morning and at bedtime. 02/18/21 02/25/21 Yes [provider]  busPIRone (BUSPAR) 15 MG tablet Place 15 mg into feeding tube 2 (two) times daily. 09/26/20  Yes [provider]  carboxymethylcellulose (REFRESH PLUS) 0.5 % SOLN Place 1 drop into both eyes 3 (three) times daily as needed (dry eyes).   Yes [provider]  denosumab (PROLIA) 60 MG/ML SOSY injection Inject 60 mg into the skin every 6 (six) months.   Yes [provider]  EPINEPHrine 0.3 mg/0.3 mL IJ SOAJ injection epinephrine 0.3 mg/0.3 mL injection, auto-injector  Inject 0.3 mLs (0.3 mg dose) into the muscle once as needed for Anaphylaxis for up to 1 dose. 09/10/18  Yes [provider]  escitalopram (LEXAPRO) 10 MG tablet Place 3 tablets (30 mg total) into feeding tube daily. Patient taking differently: Place 10 mg into feeding tube daily. 02/13/20  Yes Kasa, Maretta Bees, MD  escitalopram (LEXAPRO) 20 MG tablet Place 20 mg into feeding tube daily.   Yes [provider]  folic acid (FOLVITE) 1 MG tablet Place 1 tablet (1 mg total) into feeding tube  daily. 02/13/20  Yes Flora Lipps, MD  gabapentin (NEURONTIN) 600 MG tablet Place 600 mg into feeding tube 3 (three) times daily.   Yes [provider]  lovastatin (MEVACOR) 20 MG tablet Place 20 mg into feeding tube daily. 06/13/20  Yes [provider]  meclizine (ANTIVERT) 25 MG tablet Take 25 mg by mouth daily. 02/17/21  Yes [provider]  melatonin 5 MG TABS Place 1 tablet (5 mg total) into feeding tube at bedtime. 02/13/20  Yes Flora Lipps, MD  metFORMIN (GLUCOPHAGE) 1000 MG tablet Place 1,000 mg into feeding tube daily with breakfast. 06/10/20  Yes [provider]  metoprolol tartrate (LOPRESSOR) 25 MG tablet Place 25 mg into feeding tube daily. 06/13/20  Yes [provider]  Multiple Vitamins-Minerals (HAIR/SKIN/NAILS) TABS Take 1 tablet by mouth daily.   Yes [provider]  [facility] ALPena powder Apply topically. 01/18/21  Yes [provider]  pantoprazole (PROTONIX) 40 MG tablet Take 40 mg by mouth daily.   Yes [provider]  rOPINIRole (REQUIP) 0.5 MG tablet Place 1 tablet (0.5 mg total) into feeding tube at bedtime. 02/13/20  Yes Kasa, Maretta Bees, MD  sodium chloride 0.9 % nebulizer solution sodium chloride 0.9 % for nebulization  USE 5 ML BY NEBULIZTION AS NEEDED FOR WHEEZING 07/17/19  Yes [provider]  Scheduled Meds:  busPIRone  15 mg Per Tube BID   Chlorhexidine Gluconate Cloth  6 each Topical Q0600   escitalopram  30 mg Per Tube Daily   feeding supplement (KATE FARMS STANDARD 1.4)  325 mL Per Tube QID   fentaNYL (SUBLIMAZE) injection  100 mcg Intravenous Once   folic acid  1 mg Per Tube Daily   free water  100 mL Per Tube QID   gabapentin  600 mg Per Tube TID   heparin  5,000 Units Subcutaneous Q8H   insulin aspart  0-15 Units Subcutaneous Q4H   insulin aspart  4 Units Subcutaneous QID   ipratropium-albuterol  3 mL Nebulization Q6H   meclizine  25 mg Per Tube Daily   mouth rinse  15 mL Mouth Rinse  q12n4p   melatonin  5 mg Per Tube QHS   metoprolol tartrate  25 mg Per Tube Daily   pantoprazole sodium  40 mg Per Tube Daily   phosphorus  500 mg Per Tube Q4H   pravastatin  20 mg Per Tube q1800   rOPINIRole  0.5 mg Per Tube QHS   Continuous Infusions:  ceFEPime (MAXIPIME) IV 2 g (02/22/21 1443)   PRN Meds:.acetaminophen, ALPRAZolam, ipratropium-albuterol  Assessment & Plan:  Acute  on Chronic Hypoxic & Respiratory Failure Secondary to suspected aspiration pneumonia in the setting of 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 Hx: Tracheal stenosis  -Supplemental O2 as needed to maintain O2 sats >92% -Trach collar during the day with vent qhs (tolerating), doing well -Follow intermittent CXR & ABG as needed -VAP protocol -Ensure adequate pulmonary hygiene  -Budesonide inhaler nebs BID, Bronchodilators PRN -Hold sedation/analgesia for RASS 0  Increase WOB today, and interstitial markings on CXR Remains net negative I/O for stay  Tachycardia may have created diastolic error with pulmonary edema Cautious volume reduction  Maintain rate control, add prn rate targeted metoprolol IV  Sepsis due to suspected Abdominal Abscess and Probable Pneumonia Met sepsis criteria Lactic: 2.5, Baseline PCT: pending, UA: Pyuria no evidence of UTI, CXR: No active cardiopulmonary disease, CT: Possible Abdominal wall abscess?  -F/u cultures, trend lactic/ PCT -Monitor WBC/ fever curve -IV antibiotics: cefepime & vancomycin -IVF hydration as needed -Pressors for MAP goal >65 -Strict I/O's  Possible subcutaneous periumbilical abscess CT Abdomen/Pelvis shows shows a potential small focal subcutaneous fluid collection to the left of the umbilicus measuring 2.4 x 1.5 x 1.2 cm concerning for possible abscess with G-tube is being pulled too tightly. -Keep NPO -General surgery consult appreciated   Diabetes mellitus      -CBG's -SSI and Lantus -Follow ICU Hypo/hyperglycemia protocol    Nutritional  Status/Diet -NPO for now pending PEG patency evaluation -Will start trickle TF per PEG once General Surgery ok to use -Dietician following, TF's as tolerated -Constipation protocol as indicated    Electrolytes -follow labs as needed -replace as needed -pharmacy consultation and following    Best practice:  Diet:  Tube Feed  Pain/Anxiety/Delirium protocol (if indicated): No VAP protocol (if indicated): Yes DVT prophylaxis: Subcutaneous Heparin GI prophylaxis: PPI Glucose control:  SSI Yes Central venous access:  N/A Arterial line:  N/A Foley:  N/A Mobility:  bed rest  PT consulted: N/A Last date of multidisciplinary goals of care discussion [12/26] Code Status:  limited Disposition: Step down   = Goals of Care = Code Status Order: PARTIAL  Primary Emergency Contact: Stfleur,Joseph, Home Phone: [phone] Patient wishes to pursue ongoing treatment with relatively conservative measures (e.g., IV fluids, antibiotics, Defibrillation, Intubation, ACLS meds), but in the event of cardiac or respiratory arrest would not wish to have CPR.  Critical care time: 45 minutes     .

## 2021-02-22 NOTE — Progress Notes (Signed)
Pharmacy Antibiotic Note  Angela David is a 80 y.o. female admitted on 02/20/2021.  Pharmacy has been consulted for cefepime and vancomycin dosing for abscess. Pt was recently placed on Augmentin for possible cellulitis as an outpatient. Since admission her renal function has been stable and at apparent baseline. New interstitial markings on CXR  Plan:  1) continue cefepime 2 grams IV every 12 hours  2) restart vancomycin at 1250 mg IV every 24 hours Goal AUC 400-550 Expected AUC: 494.7 SCr used: 0.80 mg/dL (rounded up) Ke 0.046 h-1, T1/2: 15.1h Monitor renal function and adjust dose as clinically indicated  Height: 5\' 4"  (162.6 cm) Weight: 76.4 kg (168 lb 6.9 oz) IBW/kg (Calculated) : 54.7  Temp (24hrs), Avg:99.7 F (37.6 C), Min:97.9 F (36.6 C), Max:101.6 F (38.7 C)  Recent Labs  Lab 02/20/21 1137 02/21/21 0507 02/21/21 0714 02/22/21 0413  WBC 10.0 6.8  --  5.8  CREATININE 0.94 0.84  --  0.71  LATICACIDVEN 2.5* 2.6* 3.2*  --      Estimated Creatinine Clearance: 58 mL/min (by C-G formula based on SCr of 0.71 mg/dL).    Allergies  Allergen Reactions   Shellfish Allergy Shortness Of Breath    respitatory distress    Sodium Sulfite Shortness Of Breath    Severe Congestion that creates inability to breath   Sulfa Antibiotics Shortness Of Breath and Other (See Comments)    respitatory distress   Codeine Other (See Comments)    Per patient "it kept her up all night"   Glucerna [Alimentum] Itching   Wound Dressing Adhesive Rash    Antimicrobials this admission: 12/26 vancomycin >> 12/26 cefepime >>  Dose Adjustments  12/27: vancomycin 1000mg  q24h>1250mg  q24h  Microbiology results: 12/28 Bcx: pending 12/26 BCx: NG x 2 days 12/26 MRSA PCR: not detected  12/26 respiratory panel: negative 12/26 SARS CoV-2: negative 12/26 influenza A/B: negative  Thank you for allowing pharmacy to be a part of this patients care.  Dallie Piles,  PharmD 02/22/2021 6:04 PM

## 2021-02-22 NOTE — Progress Notes (Signed)
Pharmacy Antibiotic Note  Angela David is a 80 y.o. female admitted on 02/20/2021.  Pharmacy has been consulted for cefepime and vancomycin dosing for abscess. Pt was recently placed on Augmentin for possible cellulitis.   Tmax 101.6 12/28, WBC WNL, procal 0.17>0.21  Bcx NG x2 days and repeat Bcx sent given fever, MRSA PCR neg  Surgery found no evidence of infection on CT scan, vancomycin was discontinued given results   Plan: Cefepime  Will continue cefepime 2g IV q12h Monitor renal function and adjust dose as clinically indicated  Height: 5\' 4"  (162.6 cm) Weight: 76.4 kg (168 lb 6.9 oz) IBW/kg (Calculated) : 54.7  Temp (24hrs), Avg:98.1 F (36.7 C), Min:97.9 F (36.6 C), Max:98.3 F (36.8 C)  Recent Labs  Lab 02/20/21 1137 02/21/21 0507 02/21/21 0714 02/22/21 0413  WBC 10.0 6.8  --  5.8  CREATININE 0.94 0.84  --  0.71  LATICACIDVEN 2.5* 2.6* 3.2*  --      Estimated Creatinine Clearance: 58 mL/min (by C-G formula based on SCr of 0.71 mg/dL).    Allergies  Allergen Reactions   Shellfish Allergy Shortness Of Breath    respitatory distress    Sodium Sulfite Shortness Of Breath    Severe Congestion that creates inability to breath   Sulfa Antibiotics Shortness Of Breath and Other (See Comments)    respitatory distress   Codeine Other (See Comments)    Per patient "it kept her up all night"   Glucerna [Alimentum] Itching   Wound Dressing Adhesive Rash    Antimicrobials this admission: Ongoing 12/26 cefepime >>   Completed 12/26 metronidazole x 1 12/26 vancomycin >> 12/28  Dose Adjustments  12/27: vancomycin 1000mg  q24h>1250mg  q24h  Microbiology results: 12/28 Bcx: sent 12/26 BCx: NG x2 days 1226 MRSA PCR: not detected   Thank you for allowing pharmacy to be a part of this patients care.  Narda Rutherford, PharmD Pharmacy Resident  02/22/2021 7:25 AM

## 2021-02-23 ENCOUNTER — Inpatient Hospital Stay: Payer: Medicare Other | Attending: Oncology

## 2021-02-23 ENCOUNTER — Inpatient Hospital Stay: Payer: Medicare Other

## 2021-02-23 DIAGNOSIS — J9601 Acute respiratory failure with hypoxia: Secondary | ICD-10-CM | POA: Diagnosis not present

## 2021-02-23 DIAGNOSIS — R400 Somnolence: Secondary | ICD-10-CM | POA: Diagnosis not present

## 2021-02-23 DIAGNOSIS — J810 Acute pulmonary edema: Secondary | ICD-10-CM | POA: Diagnosis not present

## 2021-02-23 DIAGNOSIS — A419 Sepsis, unspecified organism: Secondary | ICD-10-CM | POA: Diagnosis not present

## 2021-02-23 LAB — CBC
HCT: 42 % (ref 36.0–46.0)
Hemoglobin: 13.2 g/dL (ref 12.0–15.0)
MCH: 26.7 pg (ref 26.0–34.0)
MCHC: 31.4 g/dL (ref 30.0–36.0)
MCV: 85 fL (ref 80.0–100.0)
Platelets: 143 10*3/uL — ABNORMAL LOW (ref 150–400)
RBC: 4.94 MIL/uL (ref 3.87–5.11)
RDW: 14.4 % (ref 11.5–15.5)
WBC: 6 10*3/uL (ref 4.0–10.5)
nRBC: 0 % (ref 0.0–0.2)

## 2021-02-23 LAB — GLUCOSE, CAPILLARY
Glucose-Capillary: 302 mg/dL — ABNORMAL HIGH (ref 70–99)
Glucose-Capillary: 222 mg/dL — ABNORMAL HIGH (ref 70–99)
Glucose-Capillary: 206 mg/dL — ABNORMAL HIGH (ref 70–99)
Glucose-Capillary: 163 mg/dL — ABNORMAL HIGH (ref 70–99)
Glucose-Capillary: 157 mg/dL — ABNORMAL HIGH (ref 70–99)
Glucose-Capillary: 155 mg/dL — ABNORMAL HIGH (ref 70–99)
Glucose-Capillary: 134 mg/dL — ABNORMAL HIGH (ref 70–99)

## 2021-02-23 LAB — BASIC METABOLIC PANEL
Anion gap: 11 (ref 5–15)
BUN: 25 mg/dL — ABNORMAL HIGH (ref 8–23)
CO2: 24 mmol/L (ref 22–32)
Calcium: 7.9 mg/dL — ABNORMAL LOW (ref 8.9–10.3)
Chloride: 98 mmol/L (ref 98–111)
Creatinine, Ser: 0.88 mg/dL (ref 0.44–1.00)
GFR, Estimated: >60 mL/min (ref >60)
Glucose, Bld: 303 mg/dL — ABNORMAL HIGH (ref 70–99)
Potassium: 3.8 mmol/L (ref 3.5–5.1)
Sodium: 133 mmol/L — ABNORMAL LOW (ref 135–145)

## 2021-02-23 LAB — PROCALCITONIN: Procalcitonin: 1.98 ng/mL

## 2021-02-23 LAB — PHOSPHORUS: Phosphorus: 1.9 mg/dL — ABNORMAL LOW (ref 2.5–4.6)

## 2021-02-23 LAB — LACTIC ACID, PLASMA: Lactic Acid, Venous: 2.1 mmol/L (ref 0.5–1.9)

## 2021-02-23 LAB — MAGNESIUM: Magnesium: 1.9 mg/dL (ref 1.7–2.4)

## 2021-02-23 MED ORDER — INSULIN DETEMIR 100 UNIT/ML ~~LOC~~ SOLN
10.0000 [IU] | Freq: Every day | SUBCUTANEOUS | Status: DC
  Administered 2021-02-23 – 2021-02-24 (×2): 10 [IU] via SUBCUTANEOUS
  Filled 2021-02-23 (×4): qty 0.1

## 2021-02-23 MED ORDER — FUROSEMIDE 10 MG/ML IJ SOLN
20.0000 mg | Freq: Two times a day (BID) | INTRAMUSCULAR | Status: AC
  Administered 2021-02-23 – 2021-02-24 (×2): 20 mg via INTRAVENOUS
  Filled 2021-02-23 (×2): qty 2

## 2021-02-23 MED ORDER — LORATADINE 10 MG PO TABS
10.0000 mg | ORAL_TABLET | Freq: Every day | ORAL | Status: DC
  Administered 2021-02-23 – 2021-02-26 (×3): 10 mg via ORAL
  Filled 2021-02-23 (×3): qty 1

## 2021-02-23 MED ORDER — MELATONIN 5 MG PO TABS
5.0000 mg | ORAL_TABLET | Freq: Every day | ORAL | Status: DC
  Administered 2021-02-23 – 2021-02-26 (×3): 5 mg via ORAL
  Filled 2021-02-23 (×3): qty 1

## 2021-02-23 MED ORDER — INSULIN ASPART 100 UNIT/ML IJ SOLN
6.0000 [IU] | Freq: Four times a day (QID) | INTRAMUSCULAR | Status: DC
  Administered 2021-02-23 – 2021-02-24 (×5): 6 [IU] via SUBCUTANEOUS
  Filled 2021-02-23 (×6): qty 1

## 2021-02-23 MED ORDER — CETIRIZINE HCL 5 MG/5ML PO SOLN
5.0000 mg | Freq: Once | ORAL | Status: AC
  Administered 2021-02-24: 16:00:00 5 mg
  Filled 2021-02-23: qty 5

## 2021-02-23 MED ORDER — K PHOS MONO-SOD PHOS DI & MONO 155-852-130 MG PO TABS
500.0000 mg | ORAL_TABLET | ORAL | Status: AC
  Administered 2021-02-23 – 2021-02-24 (×3): 500 mg
  Filled 2021-02-23 (×4): qty 2

## 2021-02-23 NOTE — Progress Notes (Signed)
PT Cancellation Note  Patient Details Name: Angela David MRN: [id number] DOB: 11-12-1940   Cancelled Treatment:    Reason Eval/Treat Not Completed: Medical issues which prohibited therapy Spoke with nurse about appropriateness of pt working with PT today.  States that pt is back on the vent and "having a panic attack"  Requests we hold today and try back tomorrow.    Kreg Shropshire, DPT 02/23/2021, 1:52 PM

## 2021-02-23 NOTE — Progress Notes (Signed)
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  Reported as high as 101.0 with associated shortness of breath and dry cough.  Also with generalized abdominal pain.  Denies urinary complaints.  In the ED, temperature 101.5 F, BP 126/65, HR 111, RR 27, SPO2 87% room air.  CT abdomen/pelvis with potential small focus subcutaneous fluid collection left of the umbilicus measuring 2.4 x 1.5 x 1.2 cm concerning for possible abscess.  General surgery consulted for further evaluation.  Patient now with also O2 requirement with possible pneumonia but no infiltrates on x-ray.  PCCM was consulted for assistance with vent management.  Patient was started on broad-spectrum antibiotics and admitted to the hospitalist service.    Assessment & Plan:   Principal Problem:   Sepsis ([place])   Acute on chronic hypoxic respiratory failure secondary to suspected aspiration pneumonia with history of chronic tracheostomy and tracheal stenosis Patient presenting to ED with shortness of breath, fever. Chest x-ray today with increased vascular congestion concerning for pulmonary edema. CT chest without contrast with moderate size right pleural effusion with overlying atelectasis, peribronchial thickening and bilateral lower lobe airspace consolidation likely pneumonia, from aspiration. --Blood cultures 12/28: no growth <24h --Vancomycin pharmacy consulted for dosing/monitoring --Cefepime 2 g IV daily 12 hours --Continue trach collar during the day if able and vent nightly;  seems to be more vent dependent --Furosemide 20 mg IV q12h --Strict I's and O's and daily weights  Sepsis, POA suspected abdominal/subcutaneous periumbilical abscess CT abdomen/pelvis shows a potential small focal subcutaneous fluid collection to the left of the umbilicus measuring 2.4 x 1.5 x 1.2 cm concerning for possible abscess with a G-tube being pulled too tightly.  Bedside specific arterial on admission with tachycardia, leukocytosis, fevers with likely source of pneumonia versus abdominal abscess. --Seen by general surgery, no intervention at this time is believes that this area of concern is a remnant of hernia sac versus postoperative seroma from her hernia repair, rather than abscess/infection. --Continue vancomycin and cefepime --If continues to have recurrent fevers, may need to consider repeat imaging of the abdomen/pelvis  Type 2 diabetes mellitus Holding home Glucophage. --Levemir 10 units subcutaneously daily --NovoLog 6 units [place] QID --SSI for coverage --CBG every 4 hours  Essential hypertension --Metoprolol 25 mg daily  GERD: Protonix 40 mg daily  Restless leg syndrome: Ropinirole 0.5 mg daily  Depression/anxiety: --Lexapro 30 mg daily --BuSpar  Hyperlipidemia: Pravastatin 20 mg daily  Hypophosphatemia --K-Phos 500 mg every 4 hours x4 doses     DVT prophylaxis: heparin injection 5,000 Units Start: 02/20/21 2200   Code Status: Partial Code Family Communication: No family present at bedside this morning  Disposition Plan:  Level of care: ICU Status is: Inpatient  Remains inpatient appropriate because: Remains dependent dependent during the day with worsening respiratory status, started on IV Lasix.   Consultants:  PCCM General surgery - signed off 12/27  Procedures:  None  Antimicrobials:  Vancomycin 12/26>> Cefepime 12/26>> Metronidazole 12/26 - 12/26    Subjective: Patient seen examined bedside, resting comfortably on the vent.  More  alert this morning, seems to be answering questions more appropriately.  Denies shortness of breath, no chest pain, no abdominal pain.  No concerns per nursing staff this morning.  No family present this morning.  Objective: Vitals:   02/23/21 0900 02/23/21 1000 02/23/21 1055 02/23/21 1100  BP:   98/63   Pulse: (!) 111 (!) 116 (!) 107 (!) 105  Resp: 15 14  15   Temp:      TempSrc:      SpO2: 96% 98%  98%  Weight:      Height:        Intake/Output Summary (Last 24 hours) at 02/23/2021 1453 Last data filed at 02/23/2021 0734 Gross per 24 hour  Intake 549.86 ml  Output 750 ml  Net -200.14 ml   Filed Weights   02/21/21 0500 02/22/21 0500 02/23/21 0431  Weight: 76.7 kg 76.4 kg 79.1 kg    Examination:  General exam: NAD, mild respiratory distress, on vent, chronically ill in appearance Respiratory system: Decreased breath sounds bilateral bases, with crackles, increased respiratory effort without accessory muscle use, on vent with FiO2 40% Cardiovascular system: S1 & S2 heard, tachycardic, regular rhythm. No JVD, murmurs, rubs, gallops or clicks. No pedal edema. Gastrointestinal system: Abdomen is nondistended, soft and nontender. No organomegaly or masses felt. Normal bowel sounds heard.  PEG tube noted with slight erythematous changes surrounding insertion site with some leakage Central nervous system: Alert. No focal neurological deficits. Extremities: Moves all extremities independently Skin: No rashes, lesions or ulcers Psychiatry: Judgement and insight appear poor. Mood & affect appropriate.     Data Reviewed: I have personally reviewed following labs and imaging studies  CBC: Recent Labs  Lab 02/20/21 1137 02/21/21 0507 02/22/21 0413 02/23/21 0449  WBC 10.0 6.8 5.8 6.0  NEUTROABS 8.9*  --   --   --   HGB 12.5 11.1* 12.5 13.2  HCT 38.8 34.8* 38.2 42.0  MCV 84.7 84.7 85.1 85.0  PLT 130* 113* 115* 182*   Basic Metabolic Panel: Recent Labs  Lab 02/20/21 1137  02/21/21 0507 02/22/21 0413 02/23/21 0449  NA 134* 134* 134* 133*  K 3.9 3.4* 4.3 3.8  CL 102 103 102 98  CO2 22 23 24 24   GLUCOSE 217* 251* 159* 303*  BUN 18 14 13  25*  CREATININE 0.94 0.84 0.71 0.88  CALCIUM 9.2 8.3* 8.8* 7.9*  MG  --   --  1.9 1.9  PHOS  --   --  1.6* 1.9*   GFR: Estimated Creatinine Clearance: 53.6 mL/min (by C-G formula based on SCr of 0.88 mg/dL). Liver Function Tests: Recent Labs  Lab 02/20/21 1137  AST 59*  ALT 41  ALKPHOS 69  BILITOT 0.9  PROT 7.7  ALBUMIN 3.9   No results for input(s): LIPASE, AMYLASE in the last 168 hours. No results for input(s): AMMONIA in the last 168 hours. Coagulation Profile: Recent Labs  Lab 02/20/21 1423  INR 1.1   Cardiac Enzymes: No results for input(s): CKTOTAL, CKMB, CKMBINDEX, TROPONINI in the last 168 hours. BNP (last 3 results) No results for input(s): PROBNP in the last 8760 hours. HbA1C: No results for input(s): HGBA1C in the last 72 hours. CBG: Recent Labs  Lab 02/22/21 2319 02/23/21 0358 02/23/21 0705 02/23/21 0813 02/23/21 1250  GLUCAP 403* 302* 222* 206* 163*   Lipid Profile: No results for input(s): CHOL, HDL, LDLCALC, TRIG, CHOLHDL, LDLDIRECT in the last 72 hours. Thyroid Function Tests: No results for input(s): TSH, T4TOTAL, FREET4, T3FREE, THYROIDAB in the last 72 hours. Anemia Panel: No results for input(s):  VITAMINB12, FOLATE, FERRITIN, TIBC, IRON, RETICCTPCT in the last 72 hours. Sepsis Labs: Recent Labs  Lab 02/21/21 0507 02/21/21 0714 02/22/21 0413 02/22/21 1929 02/23/21 0449  PROCALCITON 0.17  --  0.21  --  1.98  LATICACIDVEN 2.6* 3.2*  --  1.7 2.1*    Recent Results (from the past 240 hour(s))  Blood culture (routine x 2)     Status: None (Preliminary result)   Collection Time: 02/20/21 11:37 AM   Specimen: BLOOD  Result Value Ref Range Status   Specimen Description BLOOD BLOOD LEFT FOREARM  Final   Special Requests   Final    BOTTLES DRAWN AEROBIC AND ANAEROBIC  Blood Culture results may not be optimal due to an excessive volume of blood received in culture bottles   Culture   Final    NO GROWTH 3 DAYS Performed at [facility], [address], [place], [place] [address]    Report Status PENDING  Incomplete  Resp Panel by RT-PCR (Flu A&B, Covid)     Status: None   Collection Time: 02/20/21  2:23 PM   Specimen: Nasopharyngeal(NP) swabs in vial transport medium  Result Value Ref Range Status   SARS Coronavirus 2 by RT PCR NEGATIVE NEGATIVE Final    Comment: (NOTE) SARS-CoV-2 target nucleic acids are NOT DETECTED.  The SARS-CoV-2 RNA is generally detectable in upper respiratory specimens during the acute phase of infection. The lowest concentration of SARS-CoV-2 viral copies this assay can detect is 138 copies/mL. A negative result does not preclude SARS-Cov-2 infection and should not be used as the sole basis for treatment or other patient management decisions. A negative result may occur with  improper specimen collection/handling, submission of specimen other than nasopharyngeal swab, presence of viral mutation(s) within the areas targeted by this assay, and inadequate number of viral copies(<138 copies/mL). A negative result must be combined with clinical observations, patient history, and epidemiological information. The expected result is Negative.  Fact Sheet for Patients:  EntrepreneurPulse.com.au  Fact Sheet for Healthcare Providers:  IncredibleEmployment.be  This test is no t yet approved or cleared by the [place] FDA and  has been authorized for detection and/or diagnosis of SARS-CoV-2 by FDA under an Emergency Use Authorization (EUA). This EUA will remain  in effect (meaning this test can be used) for the duration of the COVID-19 declaration under Section 564(b)(1) of the Act, 21 U.S.C.section 360bbb-3(b)(1), unless the authorization is terminated  or revoked sooner.        Influenza A by PCR NEGATIVE NEGATIVE Final   Influenza B by PCR NEGATIVE NEGATIVE Final    Comment: (NOTE) The Xpert Xpress SARS-CoV-2/FLU/RSV plus assay is intended as an aid in the diagnosis of influenza from Nasopharyngeal swab specimens and should not be used as a sole basis for treatment. Nasal washings and aspirates are unacceptable for Xpert Xpress SARS-CoV-2/FLU/RSV testing.  Fact Sheet for Patients: EntrepreneurPulse.com.au  Fact Sheet for Healthcare Providers: IncredibleEmployment.be  This test is not yet approved or cleared by the [place] FDA and has been authorized for detection and/or diagnosis of SARS-CoV-2 by FDA under an Emergency Use Authorization (EUA). This EUA will remain in effect (meaning this test can be used) for the duration of the COVID-19 declaration under Section 564(b)(1) of the Act, 21 U.S.C. section 360bbb-3(b)(1), unless the authorization is terminated or revoked.  Performed at [facility], [name] [place], [place] [address]   Respiratory (~20 pathogens) panel by PCR     Status: None  Collection Time: 02/20/21  4:16 PM   Specimen: Nasopharyngeal Swab; Respiratory  Result Value Ref Range Status   Adenovirus NOT DETECTED NOT DETECTED Final   Coronavirus 229E NOT DETECTED NOT DETECTED Final    Comment: (NOTE) The Coronavirus on the Respiratory Panel, DOES NOT test for the novel  Coronavirus (2019 nCoV)    Coronavirus HKU1 NOT DETECTED NOT DETECTED Final   Coronavirus NL63 NOT DETECTED NOT DETECTED Final   Coronavirus OC43 NOT DETECTED NOT DETECTED Final   Metapneumovirus NOT DETECTED NOT DETECTED Final   Rhinovirus / Enterovirus NOT DETECTED NOT DETECTED Final   Influenza A NOT DETECTED NOT DETECTED Final   Influenza B NOT DETECTED NOT DETECTED Final   Parainfluenza Virus 1 NOT DETECTED NOT DETECTED Final   Parainfluenza Virus 2 NOT DETECTED NOT DETECTED Final   Parainfluenza Virus 3  NOT DETECTED NOT DETECTED Final   Parainfluenza Virus 4 NOT DETECTED NOT DETECTED Final   Respiratory Syncytial Virus NOT DETECTED NOT DETECTED Final   Bordetella pertussis NOT DETECTED NOT DETECTED Final   Bordetella Parapertussis NOT DETECTED NOT DETECTED Final   Chlamydophila pneumoniae NOT DETECTED NOT DETECTED Final   Mycoplasma pneumoniae NOT DETECTED NOT DETECTED Final    Comment: Performed at [facility], [address], [place], [place] [address]  MRSA Next Gen by PCR, Nasal     Status: None   Collection Time: 02/21/21  4:16 AM   Specimen: Nasal Mucosa; Nasal Swab  Result Value Ref Range Status   MRSA by PCR Next Gen NOT DETECTED NOT DETECTED Final    Comment: (NOTE) The GeneXpert MRSA Assay (FDA approved for NASAL specimens only), is one component of a comprehensive MRSA colonization surveillance program. It is not intended to diagnose MRSA infection nor to guide or monitor treatment for MRSA infections. Test performance is not FDA approved in patients less than 82 years old. Performed at [facility], [address] [place], [place] [address]   Culture, blood (Routine X 2) w Reflex to ID Panel     Status: None (Preliminary result)   Collection Time: 02/21/21  9:46 PM   Specimen: BLOOD  Result Value Ref Range Status   Specimen Description BLOOD LEFT HAND  Final   Special Requests   Final    BOTTLES DRAWN AEROBIC AND ANAEROBIC Blood Culture adequate volume   Culture   Final    NO GROWTH 2 DAYS Performed at [facility], [address] [place], [place] [address]    Report Status PENDING  Incomplete  CULTURE, BLOOD (ROUTINE X 2) w Reflex to ID Panel     Status: None (Preliminary result)   Collection Time: 02/22/21 11:51 AM   Specimen: BLOOD  Result Value Ref Range Status   Specimen Description BLOOD BLOOD LEFT HAND  Final   Special Requests   Final    BOTTLES DRAWN AEROBIC AND ANAEROBIC Blood Culture adequate volume   Culture   Final    NO  GROWTH < 24 HOURS Performed at [facility], [address] [place], [place] [address]    Report Status PENDING  Incomplete  CULTURE, BLOOD (ROUTINE X 2) w Reflex to ID Panel     Status: None (Preliminary result)   Collection Time: 02/22/21 11:53 AM   Specimen: BLOOD  Result Value Ref Range Status   Specimen Description BLOOD BLOOD  Final   Special Requests   Final    BOTTLES DRAWN AEROBIC AND ANAEROBIC Blood Culture adequate volume  Culture   Final    NO GROWTH < 24 HOURS Performed at [facility], [address] [place], [place] [address]    Report Status PENDING  Incomplete         Radiology Studies: CT HEAD WO CONTRAST (5MM)  Result Date: 02/22/2021 CLINICAL DATA:  Mental status change. EXAM: CT HEAD WITHOUT CONTRAST TECHNIQUE: Contiguous axial images were obtained from the base of the skull through the vertex without intravenous contrast. COMPARISON:  Head CT 10/29/2020 FINDINGS: Brain: Stable age related cerebral atrophy, ventriculomegaly and advanced periventricular white matter disease. No extra-axial fluid collections are identified. No CT findings for acute hemispheric infarction or intracranial hemorrhage. No mass lesions. The brainstem and cerebellum are normal. Vascular: Stable vascular calcifications. No aneurysm or hyperdense vessels. Skull: No acute skull fracture. Sinuses/Orbits: Paranasal sinuses and mastoid air cells are clear. The globes are intact. Other: No scalp lesions or scalp hematoma. IMPRESSION: 1. Stable age related cerebral atrophy, ventriculomegaly and advanced periventricular white matter disease. 2. No acute intracranial findings or mass lesions. Electronically Signed   By: Marijo Sanes M.D.   On: 02/22/2021 17:37   CT CHEST WO CONTRAST  Result Date: 02/22/2021 CLINICAL DATA:  Unresponsive. EXAM: CT CHEST WITHOUT CONTRAST TECHNIQUE: Multidetector CT imaging of the chest was performed following the standard protocol without IV  contrast. COMPARISON:  Chest CT 01/17/2020 FINDINGS: Cardiovascular: The heart is normal in size. No pericardial effusion. Stable tortuosity and calcification of the thoracic aorta. Stable coronary artery calcifications. Stable mitral valve annular calcifications. Mediastinum/Nodes: Borderline mediastinal and hilar lymph nodes likely inflammatory/reactive given the lung findings. Persistent markedly dilated esophagus suggesting achalasia. Air-fluid level noted in the lower half of the esophagus. Lungs/Pleura: Tracheostomy tube in good position. Moderate-sized right pleural effusion is noted. There is overlying atelectasis. Peribronchial thickening and bilateral lower lobe airspace consolidation likely pneumonia. Aspiration would certainly be a consideration given the appearance of the esophagus. No obvious pulmonary mass. Stable appearing right upper lobe pulmonary nodule measuring 5 mm on image number 35/4. Stable to slightly progressive right apical scarring changes. Upper Abdomen: No significant upper abdominal findings. Vascular calcifications are noted. Musculoskeletal: Chronic surgical and probable radiation changes involving the right breast with dense subareolar calcifications. Bone lesions most consistent with treated metastatic disease. There also multiple healed rib fractures. No new bone lesions are identified. IMPRESSION: 1. Moderate-sized right pleural effusion with overlying atelectasis. 2. Peribronchial thickening and bilateral lower lobe airspace consolidation likely pneumonia. Aspiration would certainly be a consideration given the appearance of the esophagus. 3. Stable markedly dilated esophagus suggesting achalasia. 4. Stable scattered bone lesions and healed rib fractures. 5. Aortic atherosclerosis. Aortic Atherosclerosis (ICD10-I70.0). Electronically Signed   By: Marijo Sanes M.D.   On: 02/22/2021 17:50   DG Chest Port 1 View  Result Date: 02/22/2021 CLINICAL DATA:  Sepsis.  Shortness of  breath. EXAM: PORTABLE CHEST 1 VIEW COMPARISON:  Chest x-ray from yesterday. FINDINGS: Unchanged tracheostomy tube. Normal heart size. Increasing diffuse interstitial opacities. Suspected new trace bilateral pleural effusions. No consolidation or pneumothorax. No acute osseous abnormality. IMPRESSION: 1. Increasing diffuse interstitial pulmonary edema and probable trace bilateral pleural effusions. Electronically Signed   By: Titus Dubin M.D.   On: 02/22/2021 12:35        Scheduled Meds:  Chlorhexidine Gluconate Cloth  6 each Topical Q0600   escitalopram  30 mg Per Tube Daily   feeding supplement ([place]ATE [place] STANDARD 1.4)  325 mL Per Tube QID   fentaNYL (SUBLIMAZE) injection  100 mcg Intravenous Once   folic acid  1 mg Per Tube Daily   free water  100 mL Per Tube QID   heparin  5,000 Units Subcutaneous Q8H   insulin aspart  0-15 Units Subcutaneous Q4H   insulin aspart  6 Units Subcutaneous QID   insulin detemir  10 Units Subcutaneous Daily   ipratropium-albuterol  3 mL Nebulization Q6H   mouth rinse  15 mL Mouth Rinse q12n4p   metoprolol tartrate  25 mg Per Tube Daily   pantoprazole sodium  40 mg Per Tube Daily   phosphorus  500 mg Per Tube Q4H   pravastatin  20 mg Per Tube q1800   rOPINIRole  0.5 mg Per Tube QHS   Continuous Infusions:  ceFEPime (MAXIPIME) IV Stopped (02/23/21 0250)   vancomycin Stopped (02/22/21 2156)     LOS: 3 days    Time spent: 41 minutes spent on chart review, discussion with nursing staff, consultants, updating family and interview/physical exam; more than 50% of that time was spent in counseling and/or coordination of care.    Mashelle Busick J [place] DO [facility] Available via Epic secure chat 7am-7pm After these hours, please refer to coverage provider listed on [email or web address] 02/23/2021, 2:53 PM

## 2021-02-23 NOTE — Progress Notes (Addendum)
Very alert today. Interacted and conversed with staff and husband. Attempted trach collar but patient experienced a panic attack and lasted only 20 minutes on trach collar. Watching TV and changing channels herself. Husband in to visit. Afebrile today.

## 2021-02-23 NOTE — Progress Notes (Signed)
Inpatient Diabetes Program Recommendations  AACE/ADA: New Consensus Statement on Inpatient Glycemic Control   Target Ranges:  Prepandial:   less than 140 mg/dL      Peak postprandial:   less than 180 mg/dL (1-2 hours)      Critically ill patients:  140 - 180 mg/dL    Latest Reference Range & Units 02/22/21 17:26 02/22/21 19:22 02/22/21 23:19 02/23/21 03:58  Glucose-Capillary 70 - 99 mg/dL 320 (H) 371 (H) 403 (H) 302 (H)    Latest Reference Range & Units 02/22/21 08:09 02/22/21 11:39 02/22/21 15:31  Glucose-Capillary 70 - 99 mg/dL 210 (H) 314 (H) 249 (H)   Review of Glycemic Control  iabetes history: DM2 Outpatient Diabetes medications: Metformin 1000 mg daily Current orders for Inpatient glycemic control: Novolog 0-15 units Q4H, Novolog 4 units QID; Tube feeding 325 ml QID   Inpatient Diabetes Program Recommendations:     Insulin: Please consider ordering Levemir 10 units Q24H and increasing tube feeding coverage to Novolog 6 units QID (scheduled with tube feedings - 10:00, 14:00, 18:00, and 22:00). If tube feeding is stopped or held then Novolog tube feeding coverage should also be stopped or held.   Thanks, Barnie Alderman, RN, MSN, CDE Diabetes Coordinator Inpatient Diabetes Program [phone] (Team Pager from 8am to 5pm)

## 2021-02-23 NOTE — Progress Notes (Signed)
1320 Patient states she is short of breath. SPO2 97% and respiratory rate 24. Suctioned without any sputum. Patient states she has panic attacks. Patient more satisfied on the ventilator.

## 2021-02-23 NOTE — TOC Initial Note (Signed)
Transition of Care [facility]) - Initial/Assessment Note    Patient Details  Name: Angela David MRN: [id number] Date of Birth: 27-Aug-1940  Transition of [facility]) CM/SW Contact:    Shelbie Hutching, RN Phone Number: 02/23/2021, 12:27 PM  Clinical Narrative:                 Patient admitted to the hospital for sepsis currently in ICU on the vent.  RNCM met with patient and husband at the bedside.  Patient is from home, she has a chronic trach and peg, trach since 2012, peg tube not as long.  She is not on oxygen at home but is on a vent at night.  Her husband does her bolus tube feeds at home all medical equipment and supplies comes from [employer].   Patient is pretty independent at home, she has a walker but does not use it, her husband helps her to the bathroom and she can bath herself doing a bed bath.  Patient's husband provides all transportation, she is current with her PCP in [facility] and uses [employer] for Prescriptions.   Patient has had home health in the past and does not want them again.  PT [name] have been ordered to come and work with patient.    TOC will cont to follow.   Expected Discharge Plan: Home/Self Care Barriers to Discharge: Continued Medical Work up   Patient Goals and CMS Choice Patient states their goals for this hospitalization and ongoing recovery are:: Patient wants to get back home when medically ready for discharge      Expected Discharge Plan and Services Expected Discharge Plan: Home/Self Care   Discharge Planning Services: CM Consult   Living arrangements for the past 2 months: Single Family Home                 DME Arranged: N/A DME Agency: NA       HH Arranged: Refused [place] Agency: NA        Prior Living Arrangements/Services Living arrangements for the past 2 months: Single Family Home Lives with:: Spouse Patient language and need for interpreter reviewed:: Yes Do you feel safe going back to the place where you  live?: Yes      Need for Family Participation in Patient Care: Yes (Comment) Care giver support system in place?: Yes (comment) Current home services: DME (Vent, trach supplies, feeding tube supplies, walker and bedside commode) Criminal Activity/Legal Involvement Pertinent to Current Situation/Hospitalization: No - Comment as needed  Activities of Daily Living      Permission Sought/Granted Permission sought to share information with : Case Manager, Family Supports Permission granted to share information with : Yes, Verbal Permission Granted  Share Information with NAME: Shaquinta Peruski     Permission granted to share info w Relationship: spouse  Permission granted to share info w Contact Information: [phone]  Emotional Assessment Appearance:: Appears stated age Attitude/Demeanor/Rapport: Engaged Affect (typically observed): Accepting Orientation: : Oriented to Self, Oriented to Place, Oriented to  Time, Oriented to Situation Alcohol / Substance Use: Not Applicable Psych Involvement: No (comment)  Admission diagnosis:  Dyspnea [R06.00] Sepsis ([place]) [A41.9] Sepsis with acute hypoxic respiratory failure, due to unspecified organism, unspecified whether septic shock present ([place]) [A41.9, R65.20, J96.01] Patient Active Problem List   Diagnosis Date Noted   Sepsis ([place]) 02/20/2021   Abdominal wall abscess    Chronic respiratory failure with hypoxia ([place]) 03/29/2020   Chronic diastolic CHF (congestive heart failure) ([place])  01/31/2020   Uncontrolled type 2 diabetes mellitus with hyperglycemia ([place]) 01/31/2020   AF (paroxysmal atrial fibrillation) ([place]) 01/31/2020   Aspiration pneumonia of both lower lobes due to gastric secretions ([place]) 20/25/4270   Acute metabolic encephalopathy 62/37/6283   Acute respiratory failure ([place]) 01/23/2020   Left lower lobe pneumonia 01/16/2020   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   Drug-induced polyneuropathy ([place]) 05/05/2019   Essential  (hemorrhagic) thrombocythemia ([place]) 05/05/2019   Weakness    Hypoxia    Acute respiratory failure with hypoxia and hypercapnia (HCC) 03/21/2019   Hypotension 03/21/2019   Acute encephalopathy 03/21/2019   Multiple fractures of ribs, right side, initial encounter for closed fracture 12/25/2018   Colon polyp 11/06/2018   Esophageal dysphagia    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    Stricture and stenosis of esophagus    Elevated CEA 09/02/2018   Goals of care, counseling/discussion 09/02/2018   Polyp of transverse colon 09/02/2018   Foreign body in right foot 05/26/2018   History of tracheostomy 04/30/2018   Moderate episode of recurrent major depressive disorder ([place]) 04/03/2018   Poor balance 04/03/2018   Shoulder lesion, right 02/28/2017   Cervical radiculopathy 09/28/2016   Peripheral neuropathy 08/28/2016   Diabetic peripheral neuropathy ([place]) 08/28/2016   Leg weakness, bilateral 08/13/2016   Gait abnormality 08/13/2016   Carcinoma of overlapping sites of right breast in female, estrogen receptor negative ([place]) 12/27/2015   Malignant neoplasm of breast ([place]) 03/28/2015   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[place]) 03/28/2015   Hypercholesterolemia 03/28/2015   Recurrent major depressive disorder, in partial remission ([place]) 03/01/2015   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   Cephalalgia 09/15/2014   Headache due to trauma 09/15/2014   Osteopenia 01/07/2014   Age-related osteoporosis without current pathological fracture 01/07/2014   History of colonic polyps 12/14/2013   History of breast cancer 11/26/2012   H/O malignant neoplasm of breast 11/26/2012   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   Tracheostomy present ([place]) 12/06/2011   Calcification of bronchus or trachea 01/16/2011   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   Anxiety state 12/19/2010   Blood in sputum 12/19/2010   HLD (hyperlipidemia) 12/19/2010   Type 2 diabetes mellitus ([place]) 12/19/2010   PCP:   Iva Boop, MD Pharmacy:   [facility], [place] [address] Phone: [phone] Fax: [place], [place] [address] [place] [address] Phone: [phone] Fax: [phone]     Social Determinants of Health (SDOH) Interventions    Readmission Risk Interventions Readmission Risk Prevention Plan 02/23/2021  Transportation Screening Complete  PCP or Specialist Appt within 3-5 Days Complete  HRI or Home Care Consult Complete  Social Work Consult for [place] Planning/Counseling Complete  Palliative Care Screening Not Applicable  Medication Review Press photographer) Complete  Some recent data might be hidden

## 2021-02-23 NOTE — Progress Notes (Signed)
Pharmacy Antibiotic Note  Angela David is a 80 y.o. female admitted on 02/20/2021.  Pharmacy has been consulted for cefepime and vancomycin dosing for abscess. Pt was recently placed on Augmentin for possible cellulitis as an outpatient. Since admission her renal function has been stable and at apparent baseline. New interstitial markings on CXR  Plan: Cefepime --Will continue cefepime 2 grams IV every 12 hours  Vancomycin --Will continue vancomycin at 1250 mg IV every 24 hours Goal AUC 400-550 Expected AUC: 520 SCr used: 0.88 mg/dL  Wt used: IBW, Vd:0.72 Will obtain vancomycin levels around 4th or 5th dose if continued   Will continue to monitor renal function and adjust dose as clinically indicated  Height: 5\' 4"  (162.6 cm) Weight: 79.1 kg (174 lb 6.1 oz) IBW/kg (Calculated) : 54.7  Temp (24hrs), Avg:101 F (38.3 C), Min:100 F (37.8 C), Max:101.8 F (38.8 C)  Recent Labs  Lab 02/20/21 1137 02/21/21 0507 02/21/21 0714 02/22/21 0413 02/22/21 1929 02/23/21 0449  WBC 10.0 6.8  --  5.8  --  6.0  CREATININE 0.94 0.84  --  0.71  --  0.88  LATICACIDVEN 2.5* 2.6* 3.2*  --  1.7 2.1*     Estimated Creatinine Clearance: 53.6 mL/min (by C-G formula based on SCr of 0.88 mg/dL).    Allergies  Allergen Reactions   Shellfish Allergy Shortness Of Breath    respitatory distress    Sodium Sulfite Shortness Of Breath    Severe Congestion that creates inability to breath   Sulfa Antibiotics Shortness Of Breath and Other (See Comments)    respitatory distress   Codeine Other (See Comments)    Per patient "it kept her up all night"   Glucerna [Alimentum] Itching   Wound Dressing Adhesive Rash    Antimicrobials this admission: 12/26 vancomycin >> 12/26 cefepime >>  Dose Adjustments  12/27: vancomycin 1000mg  q24h>1250mg  q24h  Microbiology results: 12/28 Bcx: NG<24h 12/26 BCx: NG x 2 days 12/26 MRSA PCR: not detected  12/26 respiratory panel: negative 12/26 SARS  CoV-2: negative 12/26 influenza A/B: negative  Thank you for allowing pharmacy to be a part of this patients care.  Narda Rutherford, PharmD Pharmacy Resident  02/23/2021 12:17 PM

## 2021-02-23 NOTE — Progress Notes (Signed)
NAME:  Angela David, MRN:  [id number], DOB:  September 12, 1940, LOS: 3 ADMISSION DATE:  02/20/2021, CONSULTATION DATE:  02/20/2021 REFERRING MD: Imagene Sheller, MD. 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  Patient reports having some pain, irritation and drainage around the PEG site since Friday. Later she developed fever, chills, generalized  malaise and SOB. Patient was started on Augmentin for suspected cellulitis of  around PEG site with no improvement.  ED Course: On arrival to the ED, she was febrile 101.5 (38.6) with blood pressure 126/65 mm Hg and pulse rate 111 beats/min, RR 27 with sats 87% on RA. There were no focal neurological deficits;  Patient mildly tachycardic mildly tachypneic hypoxic with mild abdominal pain. Given recent treatment for cellulitis, CT imaging was ordered.  Pertinent Labs in Red/Diagnostics Findings: Na+/ K+: 134/3.9 Glucose: 217 BUN/Cr.: 18/0.94 AST/ALT:59/41   WBC/ TMAX: 10.0/ afebrile PCT: pending Lactic acid: 2.5 COVID PCR: Negative  Patient had a CT scan of the abdomen pelvis today which shows a potential small focal subcutaneous fluid collection to the left of the umbilicus measuring 2.4 x 1.5 x 1.2 cm concerning for possible abscess. Surgery consulted for further evaluation.  Patient also with O2 requirement and possibly developing pneumonia but no infiltrates on chest x-ray. Patient was started on broad spectrum antibiotics and admitted to hospitalist service. PCCM consulted to assist with vent management.   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 12/26: CTA abdomen and pelvis> shows a potential small focal subcutaneous fluid collection to the left of the umbilicus measuring 2.4 x 1.5 x 1.2 cm concerning for possible abscess  Micro Data:  12/26: SARS-CoV-2 PCR> negative 12/26: Influenza PCR> negative 12/26: Blood culture x2> 12/26: Urine Culture> 12/26: MRSA PCR>>  12/26: Strep pneumo urinary antigen> 12/26: Legionella urinary antigen> Antimicrobials:  12/26 metronidazole x 1 12/26 cefepime >>  12/26 vancomycin >>    OBJECTIVE  Blood pressure 106/61, pulse (!) 102, temperature 98.3 F (36.8 C), temperature source Oral, resp. rate (!) 21, height 5' 4" (1.626 m), weight 79.1 kg, SpO2 96 %.    Vent Mode: SIMV/PC/PS FiO2 (%):  [35 %-40 %] 35 % Set Rate:  [14 bmp] 14 bmp PEEP:  [5 cmH20-8 cmH20] 5 cmH20 Pressure  Support:  [15 cmH20-17 cmH20] 15 cmH20   Intake/Output Summary (Last 24 hours) at 02/23/2021 1546 Last data filed at 02/23/2021 0734 Gross per 24 hour  Intake 549.86 ml  Output 750 ml  Net -200.14 ml    Filed Weights   02/21/21 0500 02/22/21 0500 02/23/21 0431  Weight: 76.7 kg 76.4 kg  79.1 kg   Physical Examination  GENERAL: 80 year-old critically ill patient lying in the bed with no acute distress.  EYES: Pupils equal, round, reactive to light and accommodation. No scleral icterus. Extraocular muscles intact.  HEENT: Head atraumatic, normocephalic. Oropharynx and nasopharynx clear.  NECK:  Trachea midline, on trach collar.  Neck Supple, no jugular venous distention. No thyroid enlargement, no tenderness.  LUNGS: Normal breath sounds bilaterally, no wheezing, rales,rhonchi or crepitation. No use of accessory muscles of respiration.  CARDIOVASCULAR: S1, S2 normal. No murmurs, rubs, or gallops.  ABDOMEN: Soft, nontender, nondistended. Bowel sounds present. No organomegaly or mass.  Mild irritation around the tube insertion site with minimal erythema and leakage. EXTREMITIES: No pedal edema, cyanosis, or clubbing.  NEUROLOGIC: Cranial nerves II through XII are intact.  Muscle strength 5/5 in all extremities. Sensation intact. Gait not checked.  PSYCHIATRIC: The patient is alert and oriented x 3. Nods appropriately to orientation questions, SKIN: No obvious rash, lesion, or ulcer.   Labs/imaging that I havepersonally reviewed  (right click and "Reselect all SmartList Selections" daily)     Labs   CBC: Recent Labs  Lab 02/20/21 1137 02/21/21 0507 02/22/21 0413 02/23/21 0449  WBC 10.0 6.8 5.8 6.0  NEUTROABS 8.9*  --   --   --   HGB 12.5 11.1* 12.5 13.2  HCT 38.8 34.8* 38.2 42.0  MCV 84.7 84.7 85.1 85.0  PLT 130* 113* 115* 143*     Basic Metabolic Panel: Recent Labs  Lab 02/20/21 1137 02/21/21 0507 02/22/21 0413 02/23/21 0449  NA 134* 134* 134* 133*  K 3.9 3.4* 4.3 3.8  CL 102 103 102 98  CO2 _0 GLUCOSE 217* 251* 159* 303*  BUN _1 25*  CREATININE 0.94 0.84 0.71 0.88  CALCIUM 9.2 8.3* 8.8* 7.9*  MG  --   --  1.9 1.9  PHOS  --   --  1.6* 1.9*    GFR: Estimated Creatinine Clearance: 53.6 mL/min (by C-G formula based on SCr of 0.88  mg/dL). Recent Labs  Lab 02/20/21 1137 02/21/21 0507 02/21/21 0714 02/22/21 0413 02/22/21 1929 02/23/21 0449  PROCALCITON  --  0.17  --  0.21  --  1.98  WBC 10.0 6.8  --  5.8  --  6.0  LATICACIDVEN 2.5* 2.6* 3.2*  --  1.7 2.1*     Liver Function Tests: Recent Labs  Lab 02/20/21 1137  AST 59*  ALT 41  ALKPHOS 69  BILITOT 0.9  PROT 7.7  ALBUMIN 3.9    No results for input(s): LIPASE, AMYLASE in the last 168 hours. No results for input(s): AMMONIA in the last 168 hours.  ABG    Component Value Date/Time   PHART 7.44 02/03/2020 1055   PCO2ART 53 (H) 02/03/2020 1055   PO2ART 92 02/03/2020 1055   HCO3 36.0 (H) 02/03/2020 1055   ACIDBASEDEF 0.9 01/19/2020 1024   O2SAT 97.4 02/03/2020 1055      Coagulation Profile: Recent Labs  Lab 02/20/21 1423  INR 1.1     Cardiac Enzymes: No results for input(s): CKTOTAL, CKMB, CKMBINDEX, TROPONINI in the last 168 hours.  Hgb A1c MFr Bld  °Date/Time Value Ref Range Status  °01/20/2020 07:19 AM 6.3 (H) 4.8 - 5.6 % Final  °  Comment:  °  (NOTE) °Pre diabetes:          5.7%-6.4% ° °Diabetes:              >6.4% ° °Glycemic control for   <7.0% °adults with diabetes °  °03/21/2019 03:24 AM 6.6 (H) 4.8 - 5.6 % Final  °  Comment:  °  (NOTE) °Pre diabetes:          5.7%-6.4% °Diabetes:              >6.4% °Glycemic control for   <7.0% °adults with diabetes °  ° ° °CBG: °Recent Labs  °Lab 02/22/21 °2319 02/23/21 °0358 02/23/21 °0705 02/23/21 °0813 02/23/21 °1250  °GLUCAP 403* 302* 222* 206* 163*  ° ° ° °Review of Systems:   °Patient is trached unable to obtain ROS ° °Past Medical History  °She,  has a past medical history of Breast cancer, right (HCC) (2002), Chronic respiratory failure (HCC), Diabetic peripheral neuropathy (HCC) (08/28/2016), GERD (gastroesophageal reflux disease), Glaucoma, High cholesterol, History of colon polyps, Osteoarthritis, Personal history of chemotherapy (2002), Personal history of radiation therapy (2002),  S/P percutaneous endoscopic gastrostomy (PEG) tube placement (HCC), T2DM (type 2 diabetes mellitus) (HCC), Tracheostomy dependent (HCC) (2012), Ventral hernia, and Vocal cord paralysis (2012).  ° °Surgical History   ° °Past Surgical History:  °Procedure Laterality Date  ° ABDOMINAL HYSTERECTOMY    ° BREAST BIOPSY Right 2008  ° neg  ° BREAST BIOPSY Right 06/30/2020  ° "Q" clip-path pending  ° BREAST EXCISIONAL BIOPSY Right 2002  ° positive  ° BREAST LUMPECTOMY Right 2002  ° chemo and radiation  ° BREAST LUMPECTOMY WITH RADIOFREQUENCY TAG IDENTIFICATION Right 10/18/2020  ° Procedure: BREAST LUMPECTOMY WITH RADIOFREQUENCY TAG IDENTIFICATION;  Surgeon: Pabon, Diego F, MD;  Location: [facility];  Service: General;  Laterality: Right;  ° BREAST SURGERY Right 2002  ° LUMPECTOMY, radiation, chemotherapy  ° CATARACT EXTRACTION, BILATERAL    ° CHOLECYSTECTOMY N/A 12/10/2017  ° Procedure: LAPAROSCOPIC CHOLECYSTECTOMY;  Surgeon: Sakai, Isami, DO;  Location: [facility];  Service: General;  Laterality: N/A;  ° COLON RESECTION Right 11/06/2018  ° Procedure: HAND ASSISTED LAPAROSCOPIC COLON RESECTION, RIGHT;  Surgeon: Pabon, Diego F, MD;  Location: [facility];  Service: General;  Laterality: Right;  ° COLONOSCOPY  2009,12/30/2013  ° COLONOSCOPY WITH PROPOFOL N/A 04/16/2018  ° Tubulovillous polyp proximal transverse colon, large sessile polyp, proximal transverse colon;  Surgeon: Byrnett, Jeffrey W, MD;  Location: [facility];  Service: Endoscopy;  Laterality: N/A;  ° ESOPHAGOGASTRODUODENOSCOPY (EGD) WITH PROPOFOL N/A 10/14/2018  ° Procedure: ESOPHAGOGASTRODUODENOSCOPY (EGD) WITH PROPOFOL;  Surgeon: Wohl, Darren, MD;  Location: [facility];  Service: Endoscopy;  Laterality: N/A;  ° TRACHEAL SURGERY  2013  ° TRACHEOSTOMY N/A   ° Dr Juengel  ° XI ROBOTIC ASSISTED VENTRAL HERNIA N/A 10/18/2020  ° Procedure: XI ROBOTIC ASSISTED VENTRAL HERNIA;  Surgeon: Pabon, Diego F, MD;  Location: [facility];  Service: General;  Laterality: N/A;  °   ° °Social History  ° reports that she has never smoked. She has never used smokeless tobacco. She reports that she does not drink alcohol and does not use drugs.  ° °Family History   °Her family history includes Cancer in her brother, father, and sister. There is no history of Breast cancer.  ° °Allergies °Allergies  °Allergen Reactions  ° Shellfish Allergy Shortness   Of Breath  °  respitatory distress   ° Sodium Sulfite Shortness Of Breath  °  Severe Congestion that creates inability to breath  ° Sulfa Antibiotics Shortness Of Breath and Other (See Comments)  °  respitatory distress  ° Codeine Other (See Comments)  °  Per patient "it kept her up all night"  ° Glucerna [Alimentum] Itching  ° Wound Dressing Adhesive Rash  °  ° °Home Medications  °Prior to Admission medications   °Medication Sig Start Date End Date Taking? Authorizing Provider  °acetaminophen (TYLENOL) 500 MG tablet Take 1,000 mg by mouth in the morning and at bedtime.   Yes [provider]  °amoxicillin-clavulanate (AUGMENTIN) 875-125 MG tablet Take 1 tablet by mouth in the morning and at bedtime. 02/18/21 02/25/21 Yes [provider]  °busPIRone (BUSPAR) 15 MG tablet Place 15 mg into feeding tube 2 (two) times daily. 09/26/20  Yes [provider]  °carboxymethylcellulose (REFRESH PLUS) 0.5 % SOLN Place 1 drop into both eyes 3 (three) times daily as needed (dry eyes).   Yes [provider]  °denosumab (PROLIA) 60 MG/ML SOSY injection Inject 60 mg into the skin every 6 (six) months.   Yes [provider]  °EPINEPHrine 0.3 mg/0.3 mL IJ SOAJ injection epinephrine 0.3 mg/0.3 mL injection, auto-injector ° Inject 0.3 mLs (0.3 mg dose) into the muscle once as needed for Anaphylaxis for up to 1 dose. 09/10/18  Yes [provider]  °escitalopram (LEXAPRO) 10 MG tablet Place 3 tablets (30 mg total) into feeding tube daily. °Patient taking differently: Place 10 mg into feeding tube daily. 02/13/20  Yes Kasa,  Kurian, MD  °escitalopram (LEXAPRO) 20 MG tablet Place 20 mg into feeding tube daily.   Yes [provider]  °folic acid (FOLVITE) 1 MG tablet Place 1 tablet (1 mg total) into feeding tube daily. 02/13/20  Yes Kasa, Kurian, MD  °gabapentin (NEURONTIN) 600 MG tablet Place 600 mg into feeding tube 3 (three) times daily.   Yes [provider]  °lovastatin (MEVACOR) 20 MG tablet Place 20 mg into feeding tube daily. 06/13/20  Yes [provider]  °meclizine (ANTIVERT) 25 MG tablet Take 25 mg by mouth daily. 02/17/21  Yes [provider]  °melatonin 5 MG TABS Place 1 tablet (5 mg total) into feeding tube at bedtime. 02/13/20  Yes Kasa, Kurian, MD  °metFORMIN (GLUCOPHAGE) 1000 MG tablet Place 1,000 mg into feeding tube daily with breakfast. 06/10/20  Yes [provider]  °metoprolol tartrate (LOPRESSOR) 25 MG tablet Place 25 mg into feeding tube daily. 06/13/20  Yes [provider]  °Multiple Vitamins-Minerals (HAIR/SKIN/NAILS) TABS Take 1 tablet by mouth daily.   Yes [provider]  °NYAMYC powder Apply topically. 01/18/21  Yes [provider]  °pantoprazole (PROTONIX) 40 MG tablet Take 40 mg by mouth daily.   Yes [provider]  °rOPINIRole (REQUIP) 0.5 MG tablet Place 1 tablet (0.5 mg total) into feeding tube at bedtime. 02/13/20  Yes Kasa, Kurian, MD  °sodium chloride 0.9 % nebulizer solution sodium chloride 0.9 % for nebulization ° USE 5 ML BY NEBULIZTION AS NEEDED FOR WHEEZING 07/17/19  Yes [provider]  °Scheduled Meds: ° Chlorhexidine Gluconate Cloth  6 each Topical Q0600  ° escitalopram  30 mg Per Tube Daily  ° feeding supplement (KATE FARMS STANDARD 1.4)  325 mL Per Tube QID  ° fentaNYL (SUBLIMAZE) injection  100 mcg Intravenous Once  ° folic acid  1 mg Per Tube Daily  °   free water  100 mL Per Tube QID  ° furosemide  20 mg Intravenous Q12H  ° heparin  5,000 Units Subcutaneous Q8H  ° insulin aspart  0-15 Units Subcutaneous  Q4H  ° insulin aspart  6 Units Subcutaneous QID  ° insulin detemir  10 Units Subcutaneous Daily  ° ipratropium-albuterol  3 mL Nebulization Q6H  ° mouth rinse  15 mL Mouth Rinse q12n4p  ° metoprolol tartrate  25 mg Per Tube Daily  ° pantoprazole sodium  40 mg Per Tube Daily  ° phosphorus  500 mg Per Tube Q4H  ° pravastatin  20 mg Per Tube q1800  ° rOPINIRole  0.5 mg Per Tube QHS  ° °Continuous Infusions: ° ceFEPime (MAXIPIME) IV 2 g (02/23/21 1541)  ° vancomycin Stopped (02/22/21 2156)  ° °PRN Meds:.acetaminophen, ipratropium-albuterol, metoprolol tartrate ° °Assessment & Plan:  °Acute  on Chronic Hypoxic & Respiratory Failure Secondary to suspected aspiration pneumonia in the setting of 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 °Hx: Tracheal stenosis ° °-Supplemental O2 as needed to maintain O2 sats >92% °-Trach collar during the day with vent qhs (tolerating), doing well °-Follow intermittent CXR & ABG as needed °-VAP protocol °-Ensure adequate pulmonary hygiene  °-Budesonide inhaler nebs BID, Bronchodilators PRN °-Hold sedation/analgesia for RASS 0 ° °12/28 ° °Increase WOB today, and interstitial markings on CXR °Remains net negative I/O for stay  °Tachycardia may have created diastolic error with pulmonary edema °Cautious volume reduction  °Maintain rate control, add prn rate targeted metoprolol IV °12/29 °Much improved today °Awake and alert, conversational as possible with trach/MV °HR improved, albeit some tachycardia persists  °Cautious volume reduction °Improve rate control sustain HR<100 °Maintain antibiotics ° °Sepsis due to suspected Abdominal Abscess and Probable Pneumonia °Met sepsis criteria °Lactic: 2.5, Baseline PCT: pending, UA: Pyuria no evidence of UTI, CXR: No active cardiopulmonary disease, CT: Possible Abdominal wall abscess?  °-F/u cultures, trend lactic/ PCT °-Monitor WBC/ fever curve °-IV antibiotics: cefepime & vancomycin °-IVF hydration as needed °-Pressors for MAP goal >65 °-Strict I/O's ° °Possible  subcutaneous periumbilical abscess °CT Abdomen/Pelvis shows shows a potential small focal subcutaneous fluid collection to the left of the umbilicus measuring 2.4 x 1.5 x 1.2 cm concerning for possible abscess with G-tube is being pulled too tightly. °-Keep NPO °-General surgery consult appreciated  ° °Diabetes mellitus      °-CBG's °-SSI and Lantus °-Follow ICU Hypo/hyperglycemia protocol °   °Nutritional Status/Diet °-NPO for now pending PEG patency evaluation °-Will start trickle TF per PEG once General Surgery ok to use °-Dietician following, TF's as tolerated °-Constipation protocol as indicated °   °Electrolytes °-follow labs as needed °-replace as needed °-pharmacy consultation and following ° ° ° °Best practice:  °Diet:  Tube Feed  °Pain/Anxiety/Delirium protocol (if indicated): No °VAP protocol (if indicated): Yes °DVT prophylaxis: Subcutaneous Heparin °GI prophylaxis: PPI °Glucose control:  SSI Yes °Central venous access:  N/A °Arterial line:  N/A °Foley:  N/A °Mobility:  bed rest  °PT consulted: N/A °Last date of multidisciplinary goals of care discussion [12/26] °Code Status:  limited °Disposition: Step down ° ° °= Goals of Care = °Code Status Order: PARTIAL ° Primary Emergency Contact: Murry,Joseph, Home Phone: [phone] °Patient wishes to pursue ongoing treatment with relatively conservative measures (e.g., IV fluids, antibiotics, Defibrillation, Intubation, ACLS meds), but in the event of cardiac or respiratory arrest would not wish to have CPR. ° °Critical care time: 45 minutes °  ° ° °. ° °

## 2021-02-23 NOTE — Consult Note (Signed)
PHARMACY CONSULT NOTE - FOLLOW UP  Pharmacy Consult for Electrolyte Monitoring and Replacement   Recent Labs: Potassium (mmol/L)  Date Value  02/23/2021 3.8  12/17/2011 4.1   Magnesium (mg/dL)  Date Value  02/23/2021 1.9  12/17/2011 2.2   Calcium (mg/dL)  Date Value  02/23/2021 7.9 (L)   Calcium, Total (mg/dL)  Date Value  12/17/2011 8.8   Albumin (g/dL)  Date Value  02/20/2021 3.9  08/28/2016 4.3  12/16/2011 4.1   Phosphorus (mg/dL)  Date Value  02/23/2021 1.9 (L)  12/16/2011 2.2 (L)   Sodium (mmol/L)  Date Value  02/23/2021 133 (L)  08/28/2016 143  12/17/2011 139     Assessment: 80 y.o with significant PMH of remote breast cancer in 2002 s/p chemo and radiation, DM2, ventral hernia s/p robotic incisional hernia repair and right upper outer quadrant mass s/p RFA guided right lumpectomy (09/2020), peripheral neuropathy, GERD, 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 Pharmacy has been consulted for electrolyte monitoring and replacement.   Nutrition [employer] standard TF 371mL QID Free water 184mL QID  Labs Na 134>133 K 4.3>3.8 Phos 1.6>1.9 Mg 1.9>1.9  Goal of Therapy:  Electrolytes WNL  Plan:  --Will order K phos neutral 500mg  (contains 37mmol phos and 2.38mEq K) q4h x4 doses --No other replacement is currently indicated  --Will continue to monitor and replace electrolytes as clinically indicated   Narda Rutherford, PharmD Pharmacy Resident  02/23/2021 7:21 AM

## 2021-02-23 NOTE — Progress Notes (Signed)
OT Cancellation Note  Patient Details Name: Rubie Ficco MRN: [id number] DOB: 1940/04/17   Cancelled Treatment:    Reason Eval/Treat Not Completed: Patient not medically ready. Consult received, chart reviewed. Per chart, pt back on the ventilator, not appropriate for therapy today. Will re-attempt at later date/time as medically appropriate.   Ardeth Perfect., MPH, MS, OTR/L ascom [phone] 02/23/21, 3:12 PM

## 2021-02-24 ENCOUNTER — Inpatient Hospital Stay: Payer: Medicare Other

## 2021-02-24 DIAGNOSIS — A419 Sepsis, unspecified organism: Secondary | ICD-10-CM | POA: Diagnosis not present

## 2021-02-24 DIAGNOSIS — R400 Somnolence: Secondary | ICD-10-CM | POA: Diagnosis not present

## 2021-02-24 DIAGNOSIS — J810 Acute pulmonary edema: Secondary | ICD-10-CM | POA: Diagnosis not present

## 2021-02-24 LAB — GLUCOSE, CAPILLARY
Glucose-Capillary: 139 mg/dL — ABNORMAL HIGH (ref 70–99)
Glucose-Capillary: 157 mg/dL — ABNORMAL HIGH (ref 70–99)
Glucose-Capillary: 264 mg/dL — ABNORMAL HIGH (ref 70–99)
Glucose-Capillary: 141 mg/dL — ABNORMAL HIGH (ref 70–99)
Glucose-Capillary: 311 mg/dL — ABNORMAL HIGH (ref 70–99)
Glucose-Capillary: 281 mg/dL — ABNORMAL HIGH (ref 70–99)

## 2021-02-24 LAB — BASIC METABOLIC PANEL
Anion gap: 9 (ref 5–15)
BUN: 30 mg/dL — ABNORMAL HIGH (ref 8–23)
CO2: 29 mmol/L (ref 22–32)
Calcium: 7.9 mg/dL — ABNORMAL LOW (ref 8.9–10.3)
Chloride: 101 mmol/L (ref 98–111)
Creatinine, Ser: 0.87 mg/dL (ref 0.44–1.00)
GFR, Estimated: >60 mL/min (ref >60)
Glucose, Bld: 157 mg/dL — ABNORMAL HIGH (ref 70–99)
Potassium: 3.3 mmol/L — ABNORMAL LOW (ref 3.5–5.1)
Sodium: 139 mmol/L (ref 135–145)

## 2021-02-24 LAB — PHOSPHORUS: Phosphorus: 1.7 mg/dL — ABNORMAL LOW (ref 2.5–4.6)

## 2021-02-24 LAB — PROCALCITONIN: Procalcitonin: 1.33 ng/mL

## 2021-02-24 LAB — MAGNESIUM: Magnesium: 1.8 mg/dL (ref 1.7–2.4)

## 2021-02-24 MED ORDER — MIDAZOLAM HCL 2 MG/2ML IJ SOLN
INTRAMUSCULAR | Status: AC
  Filled 2021-02-24: qty 2

## 2021-02-24 MED ORDER — METOPROLOL TARTRATE 5 MG/5ML IV SOLN
5.0000 mg | Freq: Once | INTRAVENOUS | Status: AC
  Administered 2021-02-24: 20:00:00 5 mg via INTRAVENOUS

## 2021-02-24 MED ORDER — POTASSIUM CHLORIDE 20 MEQ PO PACK
40.0000 meq | PACK | ORAL | Status: AC
  Administered 2021-02-24 (×2): 40 meq
  Filled 2021-02-24 (×2): qty 2

## 2021-02-24 MED ORDER — MAGNESIUM SULFATE 2 GM/50ML IV SOLN
2.0000 g | Freq: Once | INTRAVENOUS | Status: AC
  Administered 2021-02-24: 09:00:00 2 g via INTRAVENOUS
  Filled 2021-02-24: qty 50

## 2021-02-24 MED ORDER — ALPRAZOLAM 0.25 MG PO TABS
0.2500 mg | ORAL_TABLET | Freq: Three times a day (TID) | ORAL | Status: DC | PRN
  Administered 2021-02-26: 0.25 mg
  Filled 2021-02-24: qty 1

## 2021-02-24 MED ORDER — MIDAZOLAM HCL 2 MG/2ML IJ SOLN
1.0000 mg | Freq: Once | INTRAMUSCULAR | Status: AC
  Administered 2021-02-24: 19:00:00 1 mg via INTRAVENOUS

## 2021-02-24 MED ORDER — METOPROLOL TARTRATE 5 MG/5ML IV SOLN
2.5000 mg | INTRAVENOUS | Status: DC | PRN
Start: 2021-02-24 — End: 2021-02-24

## 2021-02-24 MED ORDER — METRONIDAZOLE 500 MG/100ML IV SOLN
500.0000 mg | Freq: Two times a day (BID) | INTRAVENOUS | Status: DC
  Administered 2021-02-24 – 2021-02-27 (×6): 500 mg via INTRAVENOUS
  Filled 2021-02-24 (×7): qty 100

## 2021-02-24 MED ORDER — DOCUSATE SODIUM 50 MG/5ML PO LIQD
100.0000 mg | Freq: Every day | ORAL | Status: DC | PRN
  Administered 2021-02-24: 11:00:00 100 mg

## 2021-02-24 MED ORDER — HYDROMORPHONE HCL 1 MG/ML IJ SOLN
INTRAMUSCULAR | Status: AC
  Filled 2021-02-24: qty 1

## 2021-02-24 MED ORDER — K PHOS MONO-SOD PHOS DI & MONO 155-852-130 MG PO TABS
500.0000 mg | ORAL_TABLET | ORAL | Status: AC
  Administered 2021-02-24 – 2021-02-25 (×4): 500 mg
  Filled 2021-02-24 (×4): qty 2

## 2021-02-24 MED ORDER — METOPROLOL TARTRATE 5 MG/5ML IV SOLN
5.0000 mg | Freq: Four times a day (QID) | INTRAVENOUS | Status: DC | PRN
  Administered 2021-02-25 – 2021-03-03 (×5): 5 mg via INTRAVENOUS
  Filled 2021-02-24 (×6): qty 5

## 2021-02-24 MED ORDER — FUROSEMIDE 10 MG/ML IJ SOLN
20.0000 mg | Freq: Two times a day (BID) | INTRAMUSCULAR | Status: DC
  Administered 2021-02-24 – 2021-02-25 (×3): 20 mg via INTRAVENOUS
  Filled 2021-02-24 (×3): qty 2

## 2021-02-24 MED ORDER — BUSPIRONE HCL 15 MG PO TABS
15.0000 mg | ORAL_TABLET | Freq: Two times a day (BID) | ORAL | Status: DC
  Administered 2021-02-24 – 2021-03-06 (×21): 15 mg
  Filled 2021-02-24 (×23): qty 1

## 2021-02-24 NOTE — Evaluation (Signed)
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   Clinical Impression   Angela David was seen for OT/PT co-evaluation this date. Prior to hospital admission, pt was MOD I for mobility and ALDs using SPC as needed for household mobility. Pt lives with spouse in home c ramped entrance. Pt presents to acute OT demonstrating impaired ADL performance and functional mobility 2/2 decreased activity tolerance and functional strength/ROM/balance deficits. Upon initial arrival pt reclined in bed, answers all PLOF questions with 1 word answers, prefers yes/no. Bed mobility initiated and pt found to have liquid BM in bed, RN and NT in to assist and request therapy return later for OOB mobility.    Upon second arrival pt found with husband at bedside. Pt requires MAX A x2 sup<>sit. MAX A don B socks seated EOB. MAX A x2 for BSC t/f, requires +3 assistance for pericare in standing. Returned to bed and left sidelying with RN and spouse in room. Pt would benefit from skilled OT to address noted impairments and functional limitations (see below for any additional details) in order to maximize safety and independence while minimizing falls risk and caregiver burden. Upon hospital discharge, recommend STR to maximize pt safety and return to PLOF.       Recommendations for follow up therapy are one component of a multi-disciplinary discharge planning process, led by the attending physician.  Recommendations may be updated based on patient status,  additional functional criteria and insurance authorization.   Follow Up Recommendations  Skilled nursing-short term rehab (<3 hours/day)    Assistance Recommended at Discharge Frequent or constant Supervision/Assistance  Functional Status Assessment  Patient has had a recent decline in their functional status and demonstrates the ability to make significant improvements in function in a reasonable and predictable amount of time.  Equipment Recommendations  Other (comment) (defer to next venue of care)    Recommendations for Other Services       Precautions / Restrictions Precautions Precautions: Fall Restrictions Weight Bearing Restrictions: No      Mobility Bed Mobility Overal bed mobility: Needs Assistance Bed Mobility: Rolling;Supine to Sit;Sit to Supine Rolling: Max assist   Supine to sit: Max assist;+2 for physical assistance;HOB elevated Sit to supine: Max assist;+2 for physical assistance        Transfers Overall transfer level: Needs assistance Equipment used: 2 person hand held assist Transfers: Sit to/from Stand;Bed to chair/wheelchair/BSC Sit to Stand: Max assist;+2 physical assistance   Squat pivot transfers: Max assist;+2 physical assistance              Balance Overall balance assessment: Needs assistance Sitting-balance support: Bilateral upper extremity supported;Feet supported Sitting balance-Leahy Scale: Fair     Standing balance support: Bilateral upper extremity supported Standing balance-Leahy Scale: Zero                             ADL either performed or assessed with clinical judgement   ADL Overall ADL's : Needs assistance/impaired  General ADL Comments: MAX A don B socks seated EOB. MAX A x2 for BSC t/f, requires +3 assistance for pericare in standing.      Pertinent Vitals/Pain Pain Assessment: No/denies pain Facial Expression: Relaxed, neutral Body  Movements: Absence of movements Muscle Tension: Relaxed Compliance with ventilator (intubated pts.): Tolerating ventilator or movement Vocalization (extubated pts.): N/A CPOT Total: 0     Hand Dominance     Extremity/Trunk Assessment Upper Extremity Assessment Upper Extremity Assessment: Generalized weakness   Lower Extremity Assessment Lower Extremity Assessment: Generalized weakness       Communication Communication Communication: Tracheostomy (chronic trach, hypophonic, prefers yes/no)   Cognition Arousal/Alertness: Awake/alert Behavior During Therapy: WFL for tasks assessed/performed;Anxious Overall Cognitive Status: Within Functional Limits for tasks assessed                                       General Comments       Exercises Exercises: Other exercises Other Exercises Other Exercises: Pt and spouse educated re: OT role, DME recs, d/c recs, falls prevention, ECS, HEP Other Exercises: LBD, toileting, rolling, sup<>sit, sit<>stand x3, SPT, sitting/standing balance/tolerance   Shoulder Instructions      Home Living Family/patient expects to be discharged to:: Private residence Living Arrangements: Spouse/significant other Available Help at Discharge: Family;Available 24 hours/day Type of Home: House Home Access: Ramped entrance                     Home Equipment: Cane - single Barista (2 wheels);Shower seat;Wheelchair - manual          Prior Functioning/Environment Prior Level of Function : Independent/Modified Independent             Mobility Comments: MOD I household mobility using SPC as needed ADLs Comments: MOD I for ADLs until ~ 2 weeka ago        OT Problem List: Decreased strength;Decreased range of motion;Decreased activity tolerance;Impaired balance (sitting and/or standing);Decreased safety awareness;Decreased knowledge of use of DME or AE;Cardiopulmonary status limiting activity      OT  Treatment/Interventions: Self-care/ADL training;Therapeutic exercise;Energy conservation;DME and/or AE instruction;Therapeutic activities;Patient/family education;Balance training    OT Goals(Current goals can be found in the care plan section) Acute Rehab OT Goals Patient Stated Goal: to not have diarhhea OT Goal Formulation: With patient/family Time For Goal Achievement: 03/10/21 Potential to Achieve Goals: Fair ADL Goals Pt Will Perform Grooming: with min assist;sitting (will tolerate >10 mins sitting) Pt Will Transfer to Toilet: with mod assist;with +2 assist;bedside commode;stand pivot transfer (c LRAD PRN) Pt Will Perform Toileting - Clothing Manipulation and hygiene: with max assist;sitting/lateral leans  OT Frequency: Min 2X/week   Barriers to D/C: Inaccessible home environment          Co-evaluation PT/OT/SLP Co-Evaluation/Treatment: Yes Reason for Co-Treatment: For patient/therapist safety;To address functional/ADL transfers;Complexity of the patient's impairments (multi-system involvement) PT goals addressed during session: Mobility/safety with mobility;Balance OT goals addressed during session: ADL's and self-care      AM-PAC OT "6 Clicks" Daily Activity     Outcome Measure Help from another person eating meals?: A Little Help from another person taking care of personal grooming?: A Lot Help from another person toileting, which includes using toliet, bedpan, or urinal?: A Lot Help from another person bathing (including washing, rinsing, drying)?: A Lot Help from another person to put on and taking off regular upper body clothing?: A Lot Help from  another person to put on and taking off regular lower body clothing?: A Lot 6 Click Score: 13   End of Session Equipment Utilized During Treatment: Gait belt Nurse Communication: Mobility status  Activity Tolerance: Patient tolerated treatment well;Patient limited by fatigue Patient left: in bed;with call bell/phone within  reach;with bed alarm set;with nursing/sitter in room;with family/visitor present  OT Visit Diagnosis: Other abnormalities of gait and mobility (R26.89);Muscle weakness (generalized) (M62.81)                Time: 1610-9604 ([id number]) OT Time Calculation (min): 39 min Charges:  OT General Charges $OT Visit: 1 Visit OT Evaluation $OT Eval High Complexity: 1 High OT Treatments $Self Care/Home Management : 8-22 mins  Dessie Coma, M.S. OTR/L  02/24/21, 3:22 PM  ascom [phone]

## 2021-02-24 NOTE — Progress Notes (Signed)
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  Reported as high as 101.0 with associated shortness of breath and dry cough.  Also with generalized abdominal pain.  Denies urinary complaints.  In the ED, temperature 101.5 F, BP 126/65, HR 111, RR 27, SPO2 87% room air.  CT abdomen/pelvis with potential small focus subcutaneous fluid collection left of the umbilicus measuring 2.4 x 1.5 x 1.2 cm concerning for possible abscess.  General surgery consulted for further evaluation.  Patient now with also O2 requirement with possible pneumonia but no infiltrates on x-ray.  PCCM was consulted for assistance with vent management.  Patient was started on broad-spectrum antibiotics and admitted to the hospitalist service.    Assessment & Plan:   Principal Problem:   Sepsis ([place]) Active Problems:   Acute pulmonary edema (HCC)   Somnolence   Acute on chronic hypoxic respiratory failure secondary to suspected aspiration pneumonia with history of chronic tracheostomy and tracheal stenosis Patient presenting to ED with shortness of breath, fever. Chest x-ray today with increased vascular congestion concerning for pulmonary edema. CT chest without contrast with moderate size right pleural effusion with overlying atelectasis, peribronchial thickening and bilateral lower lobe airspace consolidation likely pneumonia, from aspiration. --Blood cultures 12/28: no growth x 2 days --Vancomycin pharmacy consulted for dosing/monitoring --Cefepime 2 g IV daily 12  hours --Continue trach collar during the day if able and vent nightly; seems to be more vent dependent --Furosemide 20 mg IV q12h --Strict I's and O's and daily weights  Sepsis, POA suspected abdominal/subcutaneous periumbilical abscess CT abdomen/pelvis shows a potential small focal subcutaneous fluid collection to the left of the umbilicus measuring 2.4 x 1.5 x 1.2 cm concerning for possible abscess with a G-tube being pulled too tightly.  Bedside specific arterial on admission with tachycardia, leukocytosis, fevers with likely source of pneumonia versus abdominal abscess. --Seen by general surgery, no intervention at this time is believes that this area of concern is a remnant of hernia sac versus postoperative seroma from her hernia repair, rather than abscess/infection. --Continue cefepime --If continues to have recurrent fevers, may need to consider repeat imaging of the abdomen/pelvis  Type 2 diabetes mellitus Holding home Glucophage. --Levemir 10 units subcutaneously daily --NovoLog 6 units [place] QID --SSI for coverage --CBG every 4 hours  Essential hypertension --Metoprolol 25 mg daily  GERD: Protonix 40 mg daily  Restless leg syndrome: Ropinirole 0.5 mg daily  Depression/anxiety: --Lexapro 30 mg daily --BuSpar 50 mg twice daily  Hyperlipidemia: Pravastatin 20 mg daily  Hypophosphatemia --K-Phos 500 mg every 4 hours x4 doses  Weakness/debility/deconditioning: --Seen by PT/OT with recommendation of SNF placement --TOC for evaluation --Continue therapy efforts while inpatient   DVT prophylaxis: heparin injection 5,000 Units Start: 02/20/21 2200   Code Status: Partial Code Family Communication: No family present at bedside this morning  Disposition Plan:  Level of care: ICU Status is: Inpatient  Remains inpatient appropriate because: Remains dependent dependent during the day with worsening respiratory status, started on IV Lasix.   Consultants:  PCCM General  surgery - signed off 12/27  Procedures:  None  Antimicrobials:  Vancomycin 12/26>> Cefepime  12/26>> Metronidazole 12/26 - 12/26    Subjective: Patient seen examined bedside, resting comfortably on the vent.  More alert this morning, seems to be answering questions more appropriately.  Denies shortness of breath, no chest pain, no abdominal pain.  No concerns per nursing staff this morning.  No family present this morning.  Objective: Vitals:   02/24/21 1200 02/24/21 1226 02/24/21 1300 02/24/21 1428  BP: 118/68  127/71 (!) 140/91  Pulse: 96  95   Resp: (!) 26  (!) 21   Temp: 98.3 F (36.8 C) 98.9 F (37.2 C)    TempSrc:      SpO2: 96%  97%   Weight:      Height:        Intake/Output Summary (Last 24 hours) at 02/24/2021 1629 Last data filed at 02/24/2021 1435 Gross per 24 hour  Intake 100 ml  Output 1902 ml  Net -1802 ml   Filed Weights   02/22/21 0500 02/23/21 0431 02/24/21 0416  Weight: 76.4 kg 79.1 kg 78.7 kg    Examination:  General exam: NAD, mild respiratory distress, on vent, chronically ill in appearance Respiratory system: Decreased breath sounds bilateral bases, with crackles, increased respiratory effort without accessory muscle use, on vent with FiO2 40% Cardiovascular system: S1 & S2 heard, tachycardic, regular rhythm. No JVD, murmurs, rubs, gallops or clicks. No pedal edema. Gastrointestinal system: Abdomen is nondistended, soft and nontender. No organomegaly or masses felt. Normal bowel sounds heard.  PEG tube noted with slight erythematous changes surrounding insertion site with some leakage Central nervous system: Alert. No focal neurological deficits. Extremities: Moves all extremities independently Skin: No rashes, lesions or ulcers Psychiatry: Judgement and insight appear poor. Mood & affect appropriate.     Data Reviewed: I have personally reviewed following labs and imaging studies  CBC: Recent Labs  Lab 02/20/21 1137 02/21/21 0507  02/22/21 0413 02/23/21 0449  WBC 10.0 6.8 5.8 6.0  NEUTROABS 8.9*  --   --   --   HGB 12.5 11.1* 12.5 13.2  HCT 38.8 34.8* 38.2 42.0  MCV 84.7 84.7 85.1 85.0  PLT 130* 113* 115* 916*   Basic Metabolic Panel: Recent Labs  Lab 02/20/21 1137 02/21/21 0507 02/22/21 0413 02/23/21 0449 02/24/21 0403  NA 134* 134* 134* 133* 139  K 3.9 3.4* 4.3 3.8 3.3*  CL 102 103 102 98 101  CO2 22 23 24 24 29   GLUCOSE 217* 251* 159* 303* 157*  BUN 18 14 13  25* 30*  CREATININE 0.94 0.84 0.71 0.88 0.87  CALCIUM 9.2 8.3* 8.8* 7.9* 7.9*  MG  --   --  1.9 1.9 1.8  PHOS  --   --  1.6* 1.9* 1.7*   GFR: Estimated Creatinine Clearance: 54.1 mL/min (by C-G formula based on SCr of 0.87 mg/dL). Liver Function Tests: Recent Labs  Lab 02/20/21 1137  AST 59*  ALT 41  ALKPHOS 69  BILITOT 0.9  PROT 7.7  ALBUMIN 3.9   No results for input(s): LIPASE, AMYLASE in the last 168 hours. No results for input(s): AMMONIA in the last 168 hours. Coagulation Profile: Recent Labs  Lab 02/20/21 1423  INR 1.1   Cardiac Enzymes: No results for input(s): CKTOTAL, CKMB, CKMBINDEX, TROPONINI in the last 168 hours. BNP (last 3 results) No results for input(s): PROBNP in the last 8760 hours. HbA1C: No results for input(s): HGBA1C in the last 72 hours. CBG: Recent Labs  Lab 02/23/21 2328 02/24/21 0350 02/24/21 0722 02/24/21 1129 02/24/21 1512  GLUCAP 134* 139* 157* 264* 141*   Lipid Profile: No results for input(s): CHOL, HDL, LDLCALC, TRIG, CHOLHDL, LDLDIRECT in the last 72 hours. Thyroid Function Tests: No results for input(s): TSH, T4TOTAL, FREET4, T3FREE, THYROIDAB in the last 72 hours. Anemia Panel: No results for input(s): VITAMINB12, FOLATE, FERRITIN, TIBC, IRON, RETICCTPCT in the last 72 hours. Sepsis Labs: Recent Labs  Lab 02/21/21 0507 02/21/21 0714 02/22/21 0413 02/22/21 1929 02/23/21 0449 02/24/21 0403  PROCALCITON 0.17  --  0.21  --  1.98 1.33  LATICACIDVEN 2.6* 3.2*  --  1.7 2.1*   --     Recent Results (from the past 240 hour(s))  Blood culture (routine x 2)     Status: None (Preliminary result)   Collection Time: 02/20/21 11:37 AM   Specimen: BLOOD  Result Value Ref Range Status   Specimen Description BLOOD BLOOD LEFT FOREARM  Final   Special Requests   Final    BOTTLES DRAWN AEROBIC AND ANAEROBIC Blood Culture results may not be optimal due to an excessive volume of blood received in culture bottles   Culture   Final    NO GROWTH 4 DAYS Performed at [facility], [address] [place], [place] [address]    Report Status PENDING  Incomplete  Resp Panel by RT-PCR (Flu A&B, Covid)     Status: None   Collection Time: 02/20/21  2:23 PM   Specimen: Nasopharyngeal(NP) swabs in vial transport medium  Result Value Ref Range Status   SARS Coronavirus 2 by RT PCR NEGATIVE NEGATIVE Final    Comment: (NOTE) SARS-CoV-2 target nucleic acids are NOT DETECTED.  The SARS-CoV-2 RNA is generally detectable in upper respiratory specimens during the acute phase of infection. The lowest concentration of SARS-CoV-2 viral copies this assay can detect is 138 copies/mL. A negative result does not preclude SARS-Cov-2 infection and should not be used as the sole basis for treatment or other patient management decisions. A negative result may occur with  improper specimen collection/handling, submission of specimen other than nasopharyngeal swab, presence of viral mutation(s) within the areas targeted by this assay, and inadequate number of viral copies(<138 copies/mL). A negative result must be combined with clinical observations, patient history, and epidemiological information. The expected result is Negative.  Fact Sheet for Patients:  EntrepreneurPulse.com.au  Fact Sheet for Healthcare Providers:  IncredibleEmployment.be  This test is no t yet approved or cleared by the [place] FDA and  has been authorized for detection  and/or diagnosis of SARS-CoV-2 by FDA under an Emergency Use Authorization (EUA). This EUA will remain  in effect (meaning this test can be used) for the duration of the COVID-19 declaration under Section 564(b)(1) of the Act, 21 U.S.C.section 360bbb-3(b)(1), unless the authorization is terminated  or revoked sooner.       Influenza A by PCR NEGATIVE NEGATIVE Final   Influenza B by PCR NEGATIVE NEGATIVE Final    Comment: (NOTE) The Xpert Xpress SARS-CoV-2/FLU/RSV plus assay is intended as an aid in the diagnosis of influenza from Nasopharyngeal swab specimens and should not be used as a sole basis for treatment. Nasal washings and aspirates are unacceptable for Xpert Xpress SARS-CoV-2/FLU/RSV testing.  Fact Sheet for Patients: EntrepreneurPulse.com.au  Fact Sheet for Healthcare Providers: IncredibleEmployment.be  This test is not yet approved or cleared by the [place] FDA and has been authorized for detection and/or diagnosis of SARS-CoV-2 by FDA under an Emergency Use Authorization (EUA). This EUA will remain in effect (meaning this test can  be used) for the duration of the COVID-19 declaration under Section 564(b)(1) of the Act, 21 U.S.C. section 360bbb-3(b)(1), unless the authorization is terminated or revoked.  Performed at [facility], [place], [place] [address]   Respiratory (~20 pathogens) panel by PCR     Status: None   Collection Time: 02/20/21  4:16 PM   Specimen: Nasopharyngeal Swab; Respiratory  Result Value Ref Range Status   Adenovirus NOT DETECTED NOT DETECTED Final   Coronavirus 229E NOT DETECTED NOT DETECTED Final    Comment: (NOTE) The Coronavirus on the Respiratory Panel, DOES NOT test for the novel  Coronavirus (2019 nCoV)    Coronavirus HKU1 NOT DETECTED NOT DETECTED Final   Coronavirus NL63 NOT DETECTED NOT DETECTED Final   Coronavirus OC43 NOT DETECTED NOT DETECTED Final    Metapneumovirus NOT DETECTED NOT DETECTED Final   Rhinovirus / Enterovirus NOT DETECTED NOT DETECTED Final   Influenza A NOT DETECTED NOT DETECTED Final   Influenza B NOT DETECTED NOT DETECTED Final   Parainfluenza Virus 1 NOT DETECTED NOT DETECTED Final   Parainfluenza Virus 2 NOT DETECTED NOT DETECTED Final   Parainfluenza Virus 3 NOT DETECTED NOT DETECTED Final   Parainfluenza Virus 4 NOT DETECTED NOT DETECTED Final   Respiratory Syncytial Virus NOT DETECTED NOT DETECTED Final   Bordetella pertussis NOT DETECTED NOT DETECTED Final   Bordetella Parapertussis NOT DETECTED NOT DETECTED Final   Chlamydophila pneumoniae NOT DETECTED NOT DETECTED Final   Mycoplasma pneumoniae NOT DETECTED NOT DETECTED Final    Comment: Performed at [facility], [place]. [address], [place], [place] [address]  MRSA Next Gen by PCR, Nasal     Status: None   Collection Time: 02/21/21  4:16 AM   Specimen: Nasal Mucosa; Nasal Swab  Result Value Ref Range Status   MRSA by PCR Next Gen NOT DETECTED NOT DETECTED Final    Comment: (NOTE) The GeneXpert MRSA Assay (FDA approved for NASAL specimens only), is one component of a comprehensive MRSA colonization surveillance program. It is not intended to diagnose MRSA infection nor to guide or monitor treatment for MRSA infections. Test performance is not FDA approved in patients less than 38 years old. Performed at [facility], [address] [place], [place] [address]   Culture, blood (Routine X 2) w Reflex to ID Panel     Status: None (Preliminary result)   Collection Time: 02/21/21  9:46 PM   Specimen: BLOOD  Result Value Ref Range Status   Specimen Description BLOOD LEFT HAND  Final   Special Requests   Final    BOTTLES DRAWN AEROBIC AND ANAEROBIC Blood Culture adequate volume   Culture   Final    NO GROWTH 3 DAYS Performed at [facility], [address], [place], [place] [address]    Report Status PENDING  Incomplete  CULTURE,  BLOOD (ROUTINE X 2) w Reflex to ID Panel     Status: None (Preliminary result)   Collection Time: 02/22/21 11:51 AM   Specimen: BLOOD  Result Value Ref Range Status   Specimen Description BLOOD BLOOD LEFT HAND  Final   Special Requests   Final    BOTTLES DRAWN AEROBIC AND ANAEROBIC Blood Culture adequate volume   Culture   Final    NO GROWTH 2 DAYS Performed at [facility], [address] [place],  [address]    Report Status PENDING  Incomplete  CULTURE, BLOOD (ROUTINE X 2) w Reflex to ID  Panel     Status: None (Preliminary result)   Collection Time: 02/22/21 11:53 AM   Specimen: BLOOD  Result Value Ref Range Status   Specimen Description BLOOD BLOOD  Final   Special Requests   Final    BOTTLES DRAWN AEROBIC AND ANAEROBIC Blood Culture adequate volume   Culture   Final    NO GROWTH 2 DAYS Performed at [facility], [address], [place],  [address]    Report Status PENDING  Incomplete         Radiology Studies: CT HEAD WO CONTRAST (5MM)  Result Date: 02/22/2021 CLINICAL DATA:  Mental status change. EXAM: CT HEAD WITHOUT CONTRAST TECHNIQUE: Contiguous axial images were obtained from the base of the skull through the vertex without intravenous contrast. COMPARISON:  Head CT 10/29/2020 FINDINGS: Brain: Stable age related cerebral atrophy, ventriculomegaly and advanced periventricular white matter disease. No extra-axial fluid collections are identified. No CT findings for acute hemispheric infarction or intracranial hemorrhage. No mass lesions. The brainstem and cerebellum are normal. Vascular: Stable vascular calcifications. No aneurysm or hyperdense vessels. Skull: No acute skull fracture. Sinuses/Orbits: Paranasal sinuses and mastoid air cells are clear. The globes are intact. Other: No scalp lesions or scalp hematoma. IMPRESSION: 1. Stable age related cerebral atrophy, ventriculomegaly and advanced periventricular white matter disease. 2. No  acute intracranial findings or mass lesions. Electronically Signed   By: Marijo Sanes M.D.   On: 02/22/2021 17:37   CT CHEST WO CONTRAST  Result Date: 02/22/2021 CLINICAL DATA:  Unresponsive. EXAM: CT CHEST WITHOUT CONTRAST TECHNIQUE: Multidetector CT imaging of the chest was performed following the standard protocol without IV contrast. COMPARISON:  Chest CT 01/17/2020 FINDINGS: Cardiovascular: The heart is normal in size. No pericardial effusion. Stable tortuosity and calcification of the thoracic aorta. Stable coronary artery calcifications. Stable mitral valve annular calcifications. Mediastinum/Nodes: Borderline mediastinal and hilar lymph nodes likely inflammatory/reactive given the lung findings. Persistent markedly dilated esophagus suggesting achalasia. Air-fluid level noted in the lower half of the esophagus. Lungs/Pleura: Tracheostomy tube in good position. Moderate-sized right pleural effusion is noted. There is overlying atelectasis. Peribronchial thickening and bilateral lower lobe airspace consolidation likely pneumonia. Aspiration would certainly be a consideration given the appearance of the esophagus. No obvious pulmonary mass. Stable appearing right upper lobe pulmonary nodule measuring 5 mm on image number 35/4. Stable to slightly progressive right apical scarring changes. Upper Abdomen: No significant upper abdominal findings. Vascular calcifications are noted. Musculoskeletal: Chronic surgical and probable radiation changes involving the right breast with dense subareolar calcifications. Bone lesions most consistent with treated metastatic disease. There also multiple healed rib fractures. No new bone lesions are identified. IMPRESSION: 1. Moderate-sized right pleural effusion with overlying atelectasis. 2. Peribronchial thickening and bilateral lower lobe airspace consolidation likely pneumonia. Aspiration would certainly be a consideration given the appearance of the esophagus. 3.  Stable markedly dilated esophagus suggesting achalasia. 4. Stable scattered bone lesions and healed rib fractures. 5. Aortic atherosclerosis. Aortic Atherosclerosis (ICD10-I70.0). Electronically Signed   By: Marijo Sanes M.D.   On: 02/22/2021 17:50        Scheduled Meds:  busPIRone  15 mg Per Tube BID   Chlorhexidine Gluconate Cloth  6 each Topical Q0600   escitalopram  30 mg Per Tube Daily   feeding supplement (KATE FARMS STANDARD 1.4)  325 mL Per Tube QID   fentaNYL (SUBLIMAZE) injection  100 mcg Intravenous Once   folic acid  1 mg Per Tube Daily   free water  100 mL Per Tube QID   furosemide  20 mg Intravenous Q12H   heparin  5,000 Units Subcutaneous Q8H   insulin aspart  0-15 Units Subcutaneous Q4H   insulin aspart  6 Units Subcutaneous QID   insulin detemir  10 Units Subcutaneous Daily   ipratropium-albuterol  3 mL Nebulization Q6H   loratadine  10 mg Oral Daily   mouth rinse  15 mL Mouth Rinse q12n4p   melatonin  5 mg Oral QHS   metoprolol tartrate  25 mg Per Tube Daily   pantoprazole sodium  40 mg Per Tube Daily   phosphorus  500 mg Per Tube Q4H   potassium chloride  40 mEq Per Tube Q4H   pravastatin  20 mg Per Tube q1800   rOPINIRole  0.5 mg Per Tube QHS   Continuous Infusions:  ceFEPime (MAXIPIME) IV 2 g (02/24/21 1435)     LOS: 4 days    Time spent: 41 minutes spent on chart review, discussion with nursing staff, consultants, updating family and interview/physical exam; more than 50% of that time was spent in counseling and/or coordination of care.    Ameliarose Shark J [place] DO [facility] Available via Epic secure chat 7am-7pm After these hours, please refer to coverage provider listed on [email or web address] 02/24/2021, 4:29 PM

## 2021-02-24 NOTE — Progress Notes (Signed)
Acute on Chronic Hypoxic Respiratory Failure secondary to suspected aspiration in the setting of 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 Patient's respiratory status deteriorated after nursing rolled her for a bed change about 30 minutes post bolus feeding. Patient received versed 1 mg for anxiety / WOB, lopressor PRN for ST, she was placed on full vent support & STAT CXR performed which revealed a worsening R pleural effusion    - Ventilator settings:SIMV  TV 400, 40% FiO2, 5 PEEP, continue ventilator support & lung protective strategies - Wean PEEP & FiO2 as tolerated, maintain SpO2 > 90% - Head of bed elevated 30 degrees - Intermittent chest x-ray & ABG PRN - Ensure adequate pulmonary hygiene  - Tracheal aspirate sent > F/u cultures, trend PCT - Updated antibiotics to include anaerobe coverage: cefepime continued, flagyl added - bronchodilators PRN - bolus feeding held overnight, monitor CBG Q 4 h - gave additional 5 mg of lopressor one time dose > will increase PRN from 2.5 to 5 mg Q 6 PRN - xanax TID added back as PRN for anxiety    Domingo Pulse [place], AGACNP-BC Acute Care Nurse Practitioner [facility]   [phone] / [phone] Please see Amion for pager details.

## 2021-02-24 NOTE — Progress Notes (Signed)
PCCM consulted for vent management  Patient with increased WOB, placed back on Full Support. Will give 0.5 IV dilaudid for increased WOB.   Will obtain CXR.

## 2021-02-24 NOTE — Evaluation (Signed)
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   Clinical Impression  Attempted to see pt this morning. PLOF obtained; as PT/OT were setting up, BM was discovered. RN and CNA entered room to clean up pt; RN asked therapy to return at later time today. PT/OT returned this afternoon to assess mobility - goal to transfer pt to recliner. Pt asks to remain in bed however agreeable to sitting EOB and STS. Once standing, pt had another BM. Squat pivot transfer to [facility] was performed. Pt requires +2 assist for all mobility including supine<>sit, STS and squat pivot transfer. Pt was unable to achieve full hip extension in standing. +3 assist was required for pericare as +2 were needed to maintain standing position. Pt on vent; RR elevated to 35 breaths per minute during pericare. Husband states pt was ambulating and MOD I within the home 2 weeks ago. Recommending pt d/c to STR to address deficits in strength and functional mobility in order to return to PLOF.      Recommendations for follow up therapy are one component of a multi-disciplinary discharge planning process, led by the attending physician.  Recommendations may be updated based on patient status, additional functional criteria and insurance authorization.  Follow Up Recommendations Skilled nursing-short term rehab (<3 hours/day)    Assistance Recommended at Discharge Frequent or constant Supervision/Assistance  Functional Status Assessment Patient has had a recent decline in  their functional status and demonstrates the ability to make significant improvements in function in a reasonable and predictable amount of time.  Equipment Recommendations  None recommended by PT    Recommendations for Other Services       Precautions / Restrictions Precautions Precautions: Fall Restrictions Weight Bearing Restrictions: No      Mobility  Bed Mobility Overal bed mobility: Needs Assistance Bed Mobility: Rolling;Supine to Sit;Sit to Supine Rolling: Max assist   Supine to sit: Max assist;+2 for physical assistance;HOB elevated Sit to supine: +2 for physical assistance;Total assist   General bed mobility comments: Partial management with increased encouragement for BLE; MAX lifting assist of trunk. Total management of trunk and LEs to returnt o bed due to fatigue.    Transfers Overall transfer level: Needs assistance Equipment used: 2 person hand held assist Transfers: Sit to/from Stand;Bed to chair/wheelchair/BSC Sit to Stand: Max assist;+2 physical assistance     Squat pivot transfers: Max assist;+2 physical assistance     General transfer comment: MAX A to lift, steady, provide knee block and counter pressure at hips for attemted hip extension. STS x4 reps; squat pivot EOB<>BSC.    Ambulation/Gait               General Gait Details: not safe  Stairs            Wheelchair Mobility    Modified Rankin (Stroke Patients Only)       Balance Overall balance assessment: Needs assistance Sitting-balance support: Bilateral upper extremity supported;Feet supported Sitting balance-Leahy Scale: Fair Sitting balance - Comments: initial posterior lean; was able to improve to remain sitting EOB with light CGA for  safety   Standing balance support: Bilateral upper extremity supported Standing balance-Leahy Scale: Zero Standing balance comment: MAX A x2 to remain on feet - unable to achieve upright due to incomplete hip extension                              Pertinent Vitals/Pain Pain Assessment: No/denies pain Facial Expression: Relaxed, neutral Body Movements: Absence of movements Muscle Tension: Relaxed Compliance with ventilator (intubated pts.): Tolerating ventilator or movement Vocalization (extubated pts.): N/A CPOT Total: 0    Home Living Family/patient expects to be discharged to:: Private residence Living Arrangements: Spouse/significant other Available Help at Discharge: Family;Available 24 hours/day Type of Home: House Home Access: Ramped entrance       Home Layout: Other (Comment) (split level) Home Equipment: Cane - single point;Rolling Walker (2 wheels);Shower seat;Wheelchair - manual      Prior Function Prior Level of Function : Independent/Modified Independent             Mobility Comments: MOD I household mobility using SPC as needed ADLs Comments: MOD I for ADLs until ~ 2 weeks ago     Hand Dominance        Extremity/Trunk Assessment   Upper Extremity Assessment Upper Extremity Assessment: Generalized weakness    Lower Extremity Assessment Lower Extremity Assessment: Generalized weakness       Communication   Communication: Tracheostomy (chronic trach, hypophonic, prefers yes/no)  Cognition Arousal/Alertness: Awake/alert Behavior During Therapy: WFL for tasks assessed/performed;Anxious Overall Cognitive Status: Within Functional Limits for tasks assessed                                          General Comments      Exercises Other Exercises Other Exercises: Pt and spouse educated re: OT role, DME recs, d/c recs, falls prevention, ECS, HEP Other Exercises: LBD, toileting, rolling, sup<>sit, sit<>stand x3, SPT, sitting/standing balance/tolerance   Assessment/Plan    PT Assessment Patient needs continued PT services  PT Problem List Decreased strength;Decreased mobility;Decreased activity tolerance;Cardiopulmonary status limiting  activity;Decreased balance       PT Treatment Interventions DME instruction;Therapeutic activities;Gait training;Therapeutic exercise;Patient/family education;Balance training;Functional mobility training    PT Goals (Current goals can be found in the Care Plan section)  Acute Rehab PT Goals Patient Stated Goal: to get stronger PT Goal Formulation: With patient Time For Goal Achievement: 03/10/21 Potential to Achieve Goals: Good    Frequency Min 2X/week   Barriers to discharge Other (comment) currently MAX to TOTAL A +2 (+3 if performing pericre)    Co-evaluation   Reason for Co-Treatment: Complexity of the patient's impairments (multi-system involvement);For patient/therapist safety;To address functional/ADL transfers PT goals addressed during session: Mobility/safety with mobility;Balance OT goals addressed during session: ADL's and self-care       AM-PAC PT "6 Clicks" Mobility  Outcome Measure Help needed turning from your back to your side while in a flat bed without using bedrails?: A Lot Help needed moving from lying on your back to sitting on the side of a flat bed without using bedrails?: A Lot Help needed moving to and from a bed to a chair (including a wheelchair)?: Total Help needed standing up from a chair using your arms (e.g., wheelchair or bedside chair)?: Total Help needed to walk in hospital room?: Total Help needed climbing 3-5 steps with a railing? :  Total 6 Click Score: 8    End of Session Equipment Utilized During Treatment: Gait belt;Oxygen Activity Tolerance: Patient limited by fatigue Patient left: in bed;with nursing/sitter in room;with family/visitor present Nurse Communication: Mobility status;Precautions PT Visit Diagnosis: Unsteadiness on feet (R26.81);Muscle weakness (generalized) (M62.81);Difficulty in walking, not elsewhere classified (R26.2)    Time: 1610-9604 PT Time Calculation (min) (ACUTE ONLY): 50 min   Charges:   PT  Evaluation $PT Eval High Complexity: 1 High PT Treatments $Therapeutic Activity: 23-37 mins        Patrina Levering PT, DPT 02/24/21 4:11 PM [phone]

## 2021-02-24 NOTE — Progress Notes (Addendum)
0800 Patient stated she was nervous and did not sleep last night. Requested her home Buspar - order obtained and given. 0900 Also has not had a BM  since admission. Order for Colace obtained and given. Requested I contact her husband-attempted to call him x 2 without any answer.1000 PT in to exercise patient. Patient had major BM blow out.Patient bathed. When patient turned to the right Peg tube leaked about 10 ml of  bolus feed from the insertion site. Patient stated that this had happened before and was not concerned.1200 Husband in to visit. Patient pleased to see husband.1330 PT back in to work with patient. Patient had small liquid BM. Placesd on BSC per patient request. After 5 minutes on BSC patient put back in bed.Cleaned of BM. 1400 Bolus feed held due to previous leakage. Patient in agreement. 1500 Patient resting quietly.1730 Bolus fed early. 1815 Patients purewic malfunctioning. Bed wet. Bed changed. When patient turned to the right peg tube leaked at the site about 10 more ml of tube feeding. Patient anxious but ok. 1845 Patient became tachypnea and tachycardic. RT called and patient assessed per RT. Lung sounded diminished but had been all day. Patient  dropped oxygen saturation. Patient was guppy breathing Adjustments made. Dr Mortimer Fries called . Orders received. Medications given. Patient sedated.1915  night NP [place] in to assess patient.

## 2021-02-25 ENCOUNTER — Inpatient Hospital Stay
Admit: 2021-02-25 | Discharge: 2021-02-25 | Disposition: A | Discharge status: Home / Self Care | Payer: Medicare Other | Attending: Internal Medicine | Admitting: Internal Medicine

## 2021-02-25 DIAGNOSIS — R0603 Acute respiratory distress: Secondary | ICD-10-CM | POA: Diagnosis not present

## 2021-02-25 DIAGNOSIS — R400 Somnolence: Secondary | ICD-10-CM | POA: Diagnosis not present

## 2021-02-25 DIAGNOSIS — A419 Sepsis, unspecified organism: Secondary | ICD-10-CM | POA: Diagnosis not present

## 2021-02-25 DIAGNOSIS — R6520 Severe sepsis without septic shock: Secondary | ICD-10-CM | POA: Diagnosis not present

## 2021-02-25 DIAGNOSIS — J810 Acute pulmonary edema: Secondary | ICD-10-CM | POA: Diagnosis not present

## 2021-02-25 LAB — BASIC METABOLIC PANEL
Anion gap: 9 (ref 5–15)
BUN: 28 mg/dL — ABNORMAL HIGH (ref 8–23)
CO2: 32 mmol/L (ref 22–32)
Calcium: 8.2 mg/dL — ABNORMAL LOW (ref 8.9–10.3)
Chloride: 101 mmol/L (ref 98–111)
Creatinine, Ser: 0.85 mg/dL (ref 0.44–1.00)
GFR, Estimated: >60 mL/min (ref >60)
Glucose, Bld: 231 mg/dL — ABNORMAL HIGH (ref 70–99)
Potassium: 4.3 mmol/L (ref 3.5–5.1)
Sodium: 142 mmol/L (ref 135–145)

## 2021-02-25 LAB — CBC
HCT: 43.5 % (ref 36.0–46.0)
Hemoglobin: 13.5 g/dL (ref 12.0–15.0)
MCH: 26.9 pg (ref 26.0–34.0)
MCHC: 31 g/dL (ref 30.0–36.0)
MCV: 86.7 fL (ref 80.0–100.0)
Platelets: 171 10*3/uL (ref 150–400)
RBC: 5.02 MIL/uL (ref 3.87–5.11)
RDW: 14.6 % (ref 11.5–15.5)
WBC: 5.5 10*3/uL (ref 4.0–10.5)
nRBC: 0 % (ref 0.0–0.2)

## 2021-02-25 LAB — ECHOCARDIOGRAM COMPLETE
AR max vel: 1.2 cm2
AV Area VTI: 1.16 cm2
AV Area mean vel: 1.05 cm2
AV Mean grad: 14 mmHg
AV Peak grad: 21.9 mmHg
Ao pk vel: 2.34 m/s
Area-P 1/2: 4.06 cm2
Calc EF: 65 %
Height: 64 in
MV VTI: 1.41 cm2
S' Lateral: 3 cm
Single Plane A2C EF: 74.8 %
Single Plane A4C EF: 53.9 %
Weight: 2589.08 oz

## 2021-02-25 LAB — PHOSPHORUS: Phosphorus: 3 mg/dL (ref 2.5–4.6)

## 2021-02-25 LAB — CULTURE, BLOOD (ROUTINE X 2): Culture: NO GROWTH

## 2021-02-25 LAB — GLUCOSE, CAPILLARY
Glucose-Capillary: 209 mg/dL — ABNORMAL HIGH (ref 70–99)
Glucose-Capillary: 191 mg/dL — ABNORMAL HIGH (ref 70–99)
Glucose-Capillary: 177 mg/dL — ABNORMAL HIGH (ref 70–99)
Glucose-Capillary: 178 mg/dL — ABNORMAL HIGH (ref 70–99)
Glucose-Capillary: 156 mg/dL — ABNORMAL HIGH (ref 70–99)
Glucose-Capillary: 204 mg/dL — ABNORMAL HIGH (ref 70–99)

## 2021-02-25 LAB — MAGNESIUM: Magnesium: 2.4 mg/dL (ref 1.7–2.4)

## 2021-02-25 LAB — PROCALCITONIN: Procalcitonin: 1.46 ng/mL

## 2021-02-25 MED ORDER — ORAL CARE MOUTH RINSE
15.0000 mL | OROMUCOSAL | Status: DC
  Administered 2021-02-25 – 2021-03-06 (×84): 15 mL via OROMUCOSAL

## 2021-02-25 MED ORDER — CHLORHEXIDINE GLUCONATE 0.12% ORAL RINSE (MEDLINE KIT)
15.0000 mL | Freq: Two times a day (BID) | OROMUCOSAL | Status: DC
  Administered 2021-02-25 – 2021-03-06 (×17): 15 mL via OROMUCOSAL

## 2021-02-25 MED ORDER — INSULIN DETEMIR 100 UNIT/ML ~~LOC~~ SOLN
8.0000 [IU] | Freq: Two times a day (BID) | SUBCUTANEOUS | Status: DC
Start: 2021-02-25 — End: 2021-03-03
  Administered 2021-02-26 – 2021-03-03 (×13): 8 [IU] via SUBCUTANEOUS
  Filled 2021-02-25 (×14): qty 0.08

## 2021-02-25 MED ORDER — FREE WATER
30.0000 mL | Status: DC
  Administered 2021-02-25 – 2021-02-27 (×11): 30 mL

## 2021-02-25 MED ORDER — KATE FARMS STANDARD 1.4 PO LIQD
325.0000 mL | Freq: Four times a day (QID) | ORAL | Status: DC
  Administered 2021-02-25 – 2021-02-27 (×7): 325 mL
  Filled 2021-02-25: qty 325

## 2021-02-25 MED ORDER — PERFLUTREN LIPID MICROSPHERE
1.0000 mL | INTRAVENOUS | Status: AC | PRN
Start: 2021-02-25 — End: 2021-02-25
  Administered 2021-02-25: 4 mL via INTRAVENOUS
  Filled 2021-02-25: qty 10

## 2021-02-25 MED ORDER — FUROSEMIDE 10 MG/ML IJ SOLN
20.0000 mg | Freq: Two times a day (BID) | INTRAMUSCULAR | Status: DC
  Administered 2021-02-25: 20 mg via INTRAVENOUS
  Filled 2021-02-25: qty 2

## 2021-02-25 NOTE — Progress Notes (Signed)
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  Reported as high as 101.0 with associated shortness of breath and dry cough.  Also with generalized abdominal pain.  Denies urinary complaints.  In the ED, temperature 101.5 F, BP 126/65, HR 111, RR 27, SPO2 87% room air.  CT abdomen/pelvis with potential small focus subcutaneous fluid collection left of the umbilicus measuring 2.4 x 1.5 x 1.2 cm concerning for possible abscess.  General surgery consulted for further evaluation.  Patient now with also O2 requirement with possible pneumonia but no infiltrates on x-ray.  PCCM was consulted for assistance with vent management.  Patient was started on broad-spectrum antibiotics and admitted to the hospitalist service.    Assessment & Plan:   Principal Problem:   Sepsis ([place]) Active Problems:   Acute pulmonary edema (HCC)   Somnolence   Acute on chronic hypoxic respiratory failure secondary to suspected aspiration pneumonia with history of chronic tracheostomy and tracheal stenosis Aspiration pneumonia Patient presenting to ED with shortness of breath, fever. Chest x-ray today with increased vascular congestion concerning for pulmonary edema. CT chest without contrast with moderate size right pleural effusion with overlying atelectasis, peribronchial thickening and bilateral lower lobe airspace consolidation likely pneumonia, from aspiration. --Blood cultures 12/28: no growth x 3 days --Cefepime 2 g IV daily 12 hours --Metronidazole 500 mg IV  q12h --Continue trach collar during the day if able and vent nightly; seems to be more vent dependent --Furosemide 20 mg IV q12h --Strict I's and O's and daily weights  Sepsis, POA suspected abdominal/subcutaneous periumbilical abscess CT abdomen/pelvis shows a potential small focal subcutaneous fluid collection to the left of the umbilicus measuring 2.4 x 1.5 x 1.2 cm concerning for possible abscess with a G-tube being pulled too tightly.  Bedside specific arterial on admission with tachycardia, leukocytosis, fevers with likely source of pneumonia versus abdominal abscess. --Seen by general surgery, no intervention at this time is believes that this area of concern is a remnant of hernia sac versus postoperative seroma from her hernia repair, rather than abscess/infection.  Reevaluated by general surgery on 12/31, no change and no recommendation of repeat imaging --Continue cefepime and Flagyl  Type 2 diabetes mellitus Holding home Glucophage. --Levemir 10 units subcutaneously daily --NovoLog 6 units [place] QID --SSI for coverage --CBG every 4 hours  Essential hypertension --Metoprolol 25 mg daily  GERD: Protonix 40 mg daily  Restless leg syndrome: Ropinirole 0.5 mg daily  Depression/anxiety: --Lexapro 30 mg daily --BuSpar 50 mg twice daily  Hyperlipidemia: Pravastatin 20 mg daily  Hypophosphatemia resolved Repleted.  Phosphorus 3.0 this morning.  Weakness/debility/deconditioning: --Seen by PT/OT with recommendation of SNF placement --TOC for evaluation --Continue therapy efforts while inpatient   DVT prophylaxis: heparin injection 5,000 Units Start: 02/20/21 2200   Code Status: Partial Code Family Communication: No family present at bedside this morning  Disposition Plan:  Level of care: ICU Status is: Inpatient  Remains inpatient appropriate because: Remains dependent dependent during the day with worsening respiratory status, started on IV Lasix.   Consultants:   PCCM General surgery - signed off 12/27  Procedures:  None  Antimicrobials:  Vancomycin 12/26>> Cefepime 12/26>> Metronidazole 12/26 - 12/26    Subjective: Patient seen examined bedside, lying in bed.  Remains vent dependent.  Overnight apparently had an aspiration event with increased respiratory distress and tube feeds were held.  X-ray with worsening right pleural effusion.  Poorly responsive on the vent. Denies shortness of breath, no chest pain, no abdominal pain.  No family present this morning.  PCCM following, seen by general surgery again this morning.  Objective: Vitals:   02/25/21 1400 02/25/21 1433 02/25/21 1500 02/25/21 1530  BP: 117/66  115/62   Pulse: (!) 107 (!) 107 (!) 108   Resp: '14 14 14   ' Temp: (!) 102 F (38.9 C)     TempSrc: Oral     SpO2: 96% 96% 96% 96%  Weight:      Height:        Intake/Output Summary (Last 24 hours) at 02/25/2021 1540 Last data filed at 02/25/2021 1500 Gross per 24 hour  Intake 730.03 ml  Output 1151 ml  Net -420.97 ml   Filed Weights   02/23/21 0431 02/24/21 0416 02/25/21 0316  Weight: 79.1 kg 78.7 kg 73.4 kg    Examination:  General exam: NAD, mild respiratory distress, on vent, chronically ill in appearance, poorly responsive, on full vent support Respiratory system: Decreased breath sounds bilateral bases, with crackles, increased respiratory effort without accessory muscle use, on vent with FiO2 40% Cardiovascular system: S1 & S2 heard, tachycardic, regular rhythm. No JVD, murmurs, rubs, gallops or clicks. No pedal edema. Gastrointestinal system: Abdomen is nondistended, soft and nontender. No organomegaly or masses felt. Normal bowel sounds heard.  PEG tube noted with slight erythematous changes surrounding insertion site Central nervous system: Somnolent.  Extremities: Moves all extremities independently Skin: No rashes, lesions or ulcers Psychiatry: Judgement and insight appear poor. Mood & affect appropriate.      Data Reviewed: I have personally reviewed following labs and imaging studies  CBC: Recent Labs  Lab 02/20/21 1137 02/21/21 0507 02/22/21 0413 02/23/21 0449 02/25/21 0407  WBC 10.0 6.8 5.8 6.0 5.5  NEUTROABS 8.9*  --   --   --   --   HGB 12.5 11.1* 12.5 13.2 13.5  HCT 38.8 34.8* 38.2 42.0 43.5  MCV 84.7 84.7 85.1 85.0 86.7  PLT 130* 113* 115* 143* 524   Basic Metabolic Panel: Recent Labs  Lab 02/21/21 0507 02/22/21 0413 02/23/21 0449 02/24/21 0403 02/25/21 0407  NA 134* 134* 133* 139 142  K 3.4* 4.3 3.8 3.3* 4.3  CL 103 102 98 101 101  CO2 '23 24 24 29 ' 32  GLUCOSE 251* 159* 303* 157* 231*  BUN 14 13 25* 30* 28*  CREATININE 0.84 0.71 0.88 0.87 0.85  CALCIUM 8.3* 8.8* 7.9* 7.9* 8.2*  MG  --  1.9 1.9 1.8 2.4  PHOS  --  1.6* 1.9* 1.7* 3.0   GFR: Estimated Creatinine Clearance: 53.6 mL/min (by C-G formula based on SCr of 0.85 mg/dL). Liver Function Tests: Recent Labs  Lab 02/20/21 1137  AST 59*  ALT 41  ALKPHOS 69  BILITOT 0.9  PROT 7.7  ALBUMIN 3.9   No results for input(s): LIPASE, AMYLASE in the last 168 hours. No results for input(s): AMMONIA in the last 168 hours. Coagulation Profile: Recent Labs  Lab 02/20/21 1423  INR 1.1   Cardiac Enzymes: No results for input(s): CKTOTAL, CKMB, CKMBINDEX, TROPONINI in the last 168 hours. BNP (last 3 results) No results for input(s): PROBNP in the last 8760 hours.  HbA1C: No results for input(s): HGBA1C in the last 72 hours. CBG: Recent Labs  Lab 02/24/21 2314 02/25/21 0309 02/25/21 0716 02/25/21 1110 02/25/21 1529  GLUCAP 281* 209* 191* 177* 178*   Lipid Profile: No results for input(s): CHOL, HDL, LDLCALC, TRIG, CHOLHDL, LDLDIRECT in the last 72 hours. Thyroid Function Tests: No results for input(s): TSH, T4TOTAL, FREET4, T3FREE, THYROIDAB in the last 72 hours. Anemia Panel: No results for input(s): VITAMINB12, FOLATE, FERRITIN, TIBC, IRON, RETICCTPCT in the last 72 hours. Sepsis Labs: Recent  Labs  Lab 02/21/21 0507 02/21/21 0714 02/22/21 0413 02/22/21 1929 02/23/21 0449 02/24/21 0403 02/25/21 0407  PROCALCITON 0.17  --  0.21  --  1.98 1.33 1.46  LATICACIDVEN 2.6* 3.2*  --  1.7 2.1*  --   --     Recent Results (from the past 240 hour(s))  Blood culture (routine x 2)     Status: None   Collection Time: 02/20/21 11:37 AM   Specimen: BLOOD  Result Value Ref Range Status   Specimen Description BLOOD BLOOD LEFT FOREARM  Final   Special Requests   Final    BOTTLES DRAWN AEROBIC AND ANAEROBIC Blood Culture results may not be optimal due to an excessive volume of blood received in culture bottles   Culture   Final    NO GROWTH 5 DAYS Performed at [facility], [address] [place], [place] [address]    Report Status 02/25/2021 FINAL  Final  Resp Panel by RT-PCR (Flu A&B, Covid)     Status: None   Collection Time: 02/20/21  2:23 PM   Specimen: Nasopharyngeal(NP) swabs in vial transport medium  Result Value Ref Range Status   SARS Coronavirus 2 by RT PCR NEGATIVE NEGATIVE Final    Comment: (NOTE) SARS-CoV-2 target nucleic acids are NOT DETECTED.  The SARS-CoV-2 RNA is generally detectable in upper respiratory specimens during the acute phase of infection. The lowest concentration of SARS-CoV-2 viral copies this assay can detect is 138 copies/mL. A negative result does not preclude SARS-Cov-2 infection and should not be used as the sole basis for treatment or other patient management decisions. A negative result may occur with  improper specimen collection/handling, submission of specimen other than nasopharyngeal swab, presence of viral mutation(s) within the areas targeted by this assay, and inadequate number of viral copies(<138 copies/mL). A negative result must be combined with clinical observations, patient history, and epidemiological information. The expected result is Negative.  Fact Sheet for Patients:   EntrepreneurPulse.com.au  Fact Sheet for Healthcare Providers:  IncredibleEmployment.be  This test is no t yet approved or cleared by the [place] FDA and  has been authorized for detection and/or diagnosis of SARS-CoV-2 by FDA under an Emergency Use Authorization (EUA). This EUA will remain  in effect (meaning this test can be used) for the duration of the COVID-19 declaration under Section 564(b)(1) of the Act, 21 U.S.C.section 360bbb-3(b)(1), unless the authorization is terminated  or revoked sooner.       Influenza A by PCR NEGATIVE NEGATIVE Final   Influenza B by PCR NEGATIVE NEGATIVE Final    Comment: (NOTE) The Xpert Xpress SARS-CoV-2/FLU/RSV plus assay is intended as an aid in the diagnosis of influenza from Nasopharyngeal swab specimens and should not be used as a sole basis for treatment. Nasal washings and aspirates are unacceptable for Xpert Xpress SARS-CoV-2/FLU/RSV testing.  Fact Sheet for Patients: EntrepreneurPulse.com.au  Fact Sheet for Healthcare Providers: IncredibleEmployment.be  This test is not yet approved or cleared by  the [employer] and has been authorized for detection and/or diagnosis of SARS-CoV-2 by FDA under an Emergency Use Authorization (EUA). This EUA will remain in effect (meaning this test can be used) for the duration of the COVID-19 declaration under Section 564(b)(1) of the Act, 21 U.S.C. section 360bbb-3(b)(1), unless the authorization is terminated or revoked.  Performed at [facility], [place], [place] [address]   Respiratory (~20 pathogens) panel by PCR     Status: None   Collection Time: 02/20/21  4:16 PM   Specimen: Nasopharyngeal Swab; Respiratory  Result Value Ref Range Status   Adenovirus NOT DETECTED NOT DETECTED Final   Coronavirus 229E NOT DETECTED NOT DETECTED Final    Comment: (NOTE) The Coronavirus on the  Respiratory Panel, DOES NOT test for the novel  Coronavirus (2019 nCoV)    Coronavirus HKU1 NOT DETECTED NOT DETECTED Final   Coronavirus NL63 NOT DETECTED NOT DETECTED Final   Coronavirus OC43 NOT DETECTED NOT DETECTED Final   Metapneumovirus NOT DETECTED NOT DETECTED Final   Rhinovirus / Enterovirus NOT DETECTED NOT DETECTED Final   Influenza A NOT DETECTED NOT DETECTED Final   Influenza B NOT DETECTED NOT DETECTED Final   Parainfluenza Virus 1 NOT DETECTED NOT DETECTED Final   Parainfluenza Virus 2 NOT DETECTED NOT DETECTED Final   Parainfluenza Virus 3 NOT DETECTED NOT DETECTED Final   Parainfluenza Virus 4 NOT DETECTED NOT DETECTED Final   Respiratory Syncytial Virus NOT DETECTED NOT DETECTED Final   Bordetella pertussis NOT DETECTED NOT DETECTED Final   Bordetella Parapertussis NOT DETECTED NOT DETECTED Final   Chlamydophila pneumoniae NOT DETECTED NOT DETECTED Final   Mycoplasma pneumoniae NOT DETECTED NOT DETECTED Final    Comment: Performed at [facility], [place]. [address], [place], [place] [address]  MRSA Next Gen by PCR, Nasal     Status: None   Collection Time: 02/21/21  4:16 AM   Specimen: Nasal Mucosa; Nasal Swab  Result Value Ref Range Status   MRSA by PCR Next Gen NOT DETECTED NOT DETECTED Final    Comment: (NOTE) The GeneXpert MRSA Assay (FDA approved for NASAL specimens only), is one component of a comprehensive MRSA colonization surveillance program. It is not intended to diagnose MRSA infection nor to guide or monitor treatment for MRSA infections. Test performance is not FDA approved in patients less than 67 years old. Performed at [facility], [address] [place], [place] [address]   Culture, blood (Routine X 2) w Reflex to ID Panel     Status: None (Preliminary result)   Collection Time: 02/21/21  9:46 PM   Specimen: BLOOD  Result Value Ref Range Status   Specimen Description BLOOD LEFT HAND  Final   Special Requests   Final     BOTTLES DRAWN AEROBIC AND ANAEROBIC Blood Culture adequate volume   Culture   Final    NO GROWTH 4 DAYS Performed at [facility], [address], [place], [place] [address]    Report Status PENDING  Incomplete  CULTURE, BLOOD (ROUTINE X 2) w Reflex to ID Panel     Status: None (Preliminary result)   Collection Time: 02/22/21 11:51 AM   Specimen: BLOOD  Result Value Ref Range Status   Specimen Description BLOOD BLOOD LEFT HAND  Final   Special Requests   Final    BOTTLES DRAWN AEROBIC AND ANAEROBIC Blood Culture adequate volume   Culture   Final    NO GROWTH  3 DAYS Performed at [facility], [address] [place], [place] [address]    Report Status PENDING  Incomplete  CULTURE, BLOOD (ROUTINE X 2) w Reflex to ID Panel     Status: None (Preliminary result)   Collection Time: 02/22/21 11:53 AM   Specimen: BLOOD  Result Value Ref Range Status   Specimen Description BLOOD BLOOD  Final   Special Requests   Final    BOTTLES DRAWN AEROBIC AND ANAEROBIC Blood Culture adequate volume   Culture   Final    NO GROWTH 3 DAYS Performed at [facility], [address], [place], [place] [address]    Report Status PENDING  Incomplete  Culture, Respiratory w Gram Stain     Status: None (Preliminary result)   Collection Time: 02/24/21  9:00 PM   Specimen: Tracheal Aspirate; Respiratory  Result Value Ref Range Status   Specimen Description   Final    TRACHEAL ASPIRATE Performed at [facility], [address] [place], [place] [address]    Special Requests   Final    NONE Performed at [facility], [place], [place] [address]    Gram Stain   Final    NO SQUAMOUS EPITHELIAL CELLS SEEN FEW WBC SEEN NO ORGANISMS SEEN Performed at [facility], [facility] [address] [place], [place] [address]    Culture PENDING  Incomplete   Report Status PENDING  Incomplete         Radiology Studies: DG Chest Port 1 View  Result  Date: 02/24/2021 CLINICAL DATA:  Respiratory distress. EXAM: PORTABLE CHEST 1 VIEW COMPARISON:  One view chest x-ray 02/22/2021. CT chest without contrast 02/22/2021. FINDINGS: Tracheostomy tube is in place. Heart size is normal. Progressive right pleural effusion and right greater than left airspace disease noted. IMPRESSION: Progressive right pleural effusion and right greater than left airspace disease. Electronically Signed   By: San Morelle M.D.   On: 02/24/2021 19:24   ECHOCARDIOGRAM COMPLETE  Result Date: 02/25/2021    ECHOCARDIOGRAM REPORT   Patient Name:   Angela David Date of Exam: 02/25/2021 Medical Rec #:  [id number]           Height:       64.0 in Accession #:    [id number]          Weight:       161.8 lb Date of Birth:  05/16/1940          BSA:          1.788 m Patient Age:    12 years            BP:           147/75 mmHg Patient Gender: F                   HR:           120 bpm. Exam Location:  [facility] Procedure: 2D Echo and Intracardiac Opacification Agent Indications:     Acute Respiratory Distress  History:         Patient has prior history of Echocardiogram examinations. Risk                  Factors:Breast CA.  Sonographer:     L. Thornton-Maynard Referring Phys:  [id number] Flora Lipps Diagnosing Phys: Donnelly Angelica  Sonographer Comments: Echo performed with patient supine and on artificial respirator. IMPRESSIONS  1. Left ventricular ejection fraction, by estimation, is  55 to 60%. The left ventricle has normal function. The left ventricle has no regional wall motion abnormalities. There is moderate left ventricular hypertrophy. Left ventricular diastolic parameters are indeterminate.  2. Right ventricular systolic function is normal. The right ventricular size is not well visualized.  3. The mitral valve was not well visualized. Moderate mitral valve regurgitation. No evidence of mitral stenosis.  4. The aortic valve is grossly normal. Aortic valve regurgitation is not visualized.  Mild aortic valve stenosis. Conclusion(s)/Recommendation(s): POOR ACOUSTIC WINDOWS LIMITS TOTALLY ACCURATE ASSESSMENT OF VALVULAR PATHOLOGY. FINDINGS  Left Ventricle: Left ventricular ejection fraction, by estimation, is 55 to 60%. The left ventricle has normal function. The left ventricle has no regional wall motion abnormalities. Definity contrast agent was given IV to delineate the left ventricular  endocardial borders. The left ventricular internal cavity size was normal in size. There is moderate left ventricular hypertrophy. Left ventricular diastolic parameters are indeterminate. Right Ventricle: The right ventricular size is not well visualized. Right vetricular wall thickness was not well visualized. Right ventricular systolic function is normal. Left Atrium: Left atrial size was not well visualized. Right Atrium: Right atrial size was not well visualized. Pericardium: There is no evidence of pericardial effusion. Mitral Valve: The mitral valve was not well visualized. Moderate mitral valve regurgitation. No evidence of mitral valve stenosis. MV peak gradient, 10.6 mmHg. The mean mitral valve gradient is 4.0 mmHg. Tricuspid Valve: The tricuspid valve is not well visualized. Tricuspid valve regurgitation is trivial. Aortic Valve: The aortic valve is grossly normal. Aortic valve regurgitation is not visualized. Mild aortic stenosis is present. Aortic valve mean gradient measures 14.0 mmHg. Aortic valve peak gradient measures 21.9 mmHg. Aortic valve area, by VTI measures 1.16 cm. Pulmonic Valve: The pulmonic valve was not well visualized. No evidence of pulmonic stenosis. Aorta: The aortic root is normal in size and structure. Venous: The inferior vena cava was not well visualized. IAS/Shunts: The interatrial septum was not well visualized.  LEFT VENTRICLE PLAX 2D LVIDd:         3.85 cm     Diastology LVIDs:         3.00 cm     LV e' medial:    5.11 cm/s LV PW:         1.15 cm     LV E/e' medial:  20.5 LV  IVS:        1.15 cm     LV e' lateral:   5.87 cm/s LVOT diam:     2.10 cm     LV E/e' lateral: 17.9 LV SV:         38 LV SV Index:   22 LVOT Area:     3.46 cm  LV Volumes (MOD) LV vol d, MOD A2C: 55.1 ml LV vol d, MOD A4C: 43.4 ml LV vol s, MOD A2C: 13.9 ml LV vol s, MOD A4C: 20.0 ml LV SV MOD A2C:     41.2 ml LV SV MOD A4C:     43.4 ml LV SV MOD BP:      31.9 ml RIGHT VENTRICLE RV S prime:     8.92 cm/s TAPSE (M-mode): 1.5 cm LEFT ATRIUM             Index LA diam:        2.50 cm 1.40 cm/m LA Vol (A2C):   24.1 ml 13.48 ml/m LA Vol (A4C):   28.5 ml 15.94 ml/m LA Biplane Vol: 27.0 ml 15.10 ml/m  AORTIC VALVE  PULMONIC VALVE AV Area (Vmax):    1.20 cm      PV Vmax:       0.63 m/s AV Area (Vmean):   1.05 cm      PV Peak grad:  1.6 mmHg AV Area (VTI):     1.16 cm AV Vmax:           234.00 cm/s AV Vmean:          179.000 cm/s AV VTI:            0.332 m AV Peak Grad:      21.9 mmHg AV Mean Grad:      14.0 mmHg LVOT Vmax:         80.90 cm/s LVOT Vmean:        54.500 cm/s LVOT VTI:          0.111 m LVOT/AV VTI ratio: 0.33  AORTA Ao Root diam: 2.60 cm Ao Asc diam:  2.80 cm MITRAL VALVE MV Area (PHT): 4.06 cm     SHUNTS MV Area VTI:   1.41 cm     Systemic VTI:  0.11 m MV Peak grad:  10.6 mmHg    Systemic Diam: 2.10 cm MV Mean grad:  4.0 mmHg MV Vmax:       1.63 m/s MV Vmean:      91.4 cm/s MV Decel Time: 187 msec MV E velocity: 105.00 cm/s MV A velocity: 115.00 cm/s MV E/A ratio:  0.91 Donnelly Angelica Electronically signed by Donnelly Angelica Signature Date/Time: 02/25/2021/12:10:48 PM    Final         Scheduled Meds:  busPIRone  15 mg Per Tube BID   chlorhexidine gluconate (MEDLINE KIT)  15 mL Mouth Rinse BID   Chlorhexidine Gluconate Cloth  6 each Topical Q0600   escitalopram  30 mg Per Tube Daily   feeding supplement (KATE FARMS STANDARD 1.4)  325 mL Per Tube QID   fentaNYL (SUBLIMAZE) injection  100 mcg Intravenous Once   folic acid  1 mg Per Tube Daily   free water  100 mL Per Tube QID    furosemide  20 mg Intravenous Q12H   heparin  5,000 Units Subcutaneous Q8H   insulin aspart  0-15 Units Subcutaneous Q4H   insulin detemir  10 Units Subcutaneous Daily   ipratropium-albuterol  3 mL Nebulization Q6H   loratadine  10 mg Oral Daily   mouth rinse  15 mL Mouth Rinse 10 times per day   melatonin  5 mg Oral QHS   metoprolol tartrate  25 mg Per Tube Daily   pantoprazole sodium  40 mg Per Tube Daily   pravastatin  20 mg Per Tube q1800   rOPINIRole  0.5 mg Per Tube QHS   Continuous Infusions:  ceFEPime (MAXIPIME) IV Stopped (02/25/21 1333)   metronidazole Stopped (02/25/21 0945)     LOS: 5 days    Time spent: 39 minutes spent on chart review, discussion with nursing staff, consultants, updating family and interview/physical exam; more than 50% of that time was spent in counseling and/or coordination of care.    Hayla Hinger J [place] DO [facility] Available via Epic secure chat 7am-7pm After these hours, please refer to coverage provider listed on [email or web address] 02/25/2021, 3:40 PM

## 2021-02-25 NOTE — Progress Notes (Signed)
NAME:  Angela David, MRN:  [id number], DOB:  [birth year], LOS: 5 ADMISSION DATE:  02/20/2021, CONSULTATION DATE:  02/20/2021 REFERRING MD: Imagene Sheller, MD. 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  Patient reports having some pain, irritation and drainage around the PEG site since Friday. Later she developed fever, chills, generalized  malaise and SOB. Patient was started on Augmentin for suspected cellulitis of  around PEG site with no improvement.  ED Course: On arrival to the ED, she was febrile 101.5 (38.6) with blood pressure 126/65 mm Hg and pulse rate 111 beats/min, RR 27 with sats 87% on RA. There were no focal neurological deficits;  Patient mildly tachycardic mildly tachypneic hypoxic with mild abdominal pain. Given recent treatment for cellulitis, CT imaging was ordered.  Pertinent Labs in Red/Diagnostics Findings: Na+/ K+: 134/3.9 Glucose: 217 BUN/Cr.: 18/0.94 AST/ALT:59/41   WBC/ TMAX: 10.0/ afebrile PCT: pending Lactic acid: 2.5 COVID PCR: Negative  Patient had a CT scan of the abdomen pelvis today which shows a potential small focal subcutaneous fluid collection to the left of the umbilicus measuring 2.4 x 1.5 x 1.2 cm concerning for possible abscess. Surgery consulted for further evaluation.  Patient also with O2 requirement and possibly developing pneumonia but no infiltrates on chest x-ray. Patient was started on broad spectrum antibiotics and admitted to hospitalist service. PCCM consulted to assist with vent management.   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 12/26: CTA abdomen and pelvis> shows a potential small focal subcutaneous fluid collection to the left of the umbilicus measuring 2.4 x 1.5 x 1.2 cm concerning for possible abscess 12/30 +aspiration evenet  Micro Data:  12/26: SARS-CoV-2 PCR> negative 12/26: Influenza PCR> negative 12/26: Blood culture x2> 12/26: Urine Culture> 12/26: MRSA PCR>>  12/26: Strep pneumo urinary antigen> 12/26: Legionella urinary antigen> Antimicrobials:  12/26 metronidazole x 1 12/26 cefepime >>  12/26 vancomycin >>     INTERVAL CHANGES Placed on full  vent support +signs of aspiration despite having feeding tube More calm this AM      OBJECTIVE  Blood pressure (!) 147/75, pulse (!) 121, temperature 99.4 F (37.4 C), temperature source Axillary, resp. rate (!) 23, height '5\' 4"'  (1.626  m), weight 73.4 kg, SpO2 93 %.    Vent Mode: SIMV/PC/PS FiO2 (%):  [30 %-100 %] 40 % Set Rate:  [14 bmp] 14 bmp PEEP:  [5 cmH20] 5 cmH20 Pressure Support:  [12 cmH20-15 cmH20] 15 cmH20 Plateau Pressure:  [16 cmH20-17 cmH20] 17 cmH20    Intake/Output Summary (Last 24 hours) at 02/25/2021 0723 Last data filed at 02/25/2021 0400 Gross per 24 hour  Intake 400 ml  Output 1603 ml  Net -1203 ml    Filed Weights   02/23/21 0431 02/24/21 0416 02/25/21 0316  Weight: 79.1 kg 78.7 kg 73.4 kg    ROS limited due to sedatives  Physical Examination:   General Appearance: +distress, +trach EYES PERRLA, EOM intact.   NECK Supple, No JVD Pulmonary: +wheezing.  CardiovascularNormal S1,S2.  No m/r/g.   Abdomen: Benign, Soft, non-tender. Drainage around peg tube  ALL OTHER ROS ARE NEGATIVE  Labs/imaging that I havepersonally reviewed  (right click and "Reselect all SmartList Selections" daily)     Labs   CBC: Recent Labs  Lab 02/20/21 1137 02/21/21 0507 02/22/21 0413 02/23/21 0449 02/25/21 0407  WBC 10.0 6.8 5.8 6.0 5.5  NEUTROABS 8.9*  --   --   --   --   HGB 12.5 11.1* 12.5 13.2 13.5  HCT 38.8 34.8* 38.2 42.0 43.5  MCV 84.7 84.7 85.1 85.0 86.7  PLT 130* 113* 115* 143* 171     Basic Metabolic Panel: Recent Labs  Lab 02/21/21 0507 02/22/21 0413 02/23/21 0449 02/24/21 0403 02/25/21 0407  NA 134* 134* 133* 139 142  K 3.4* 4.3 3.8 3.3* 4.3  CL 103 102 98 101 101  CO2 '23 24 24 29 ' 32  GLUCOSE 251* 159* 303* 157* 231*  BUN 14 13 25* 30* 28*  CREATININE 0.84 0.71 0.88 0.87 0.85  CALCIUM 8.3* 8.8* 7.9* 7.9* 8.2*  MG  --  1.9 1.9 1.8 2.4  PHOS  --  1.6* 1.9* 1.7* 3.0    GFR: Estimated Creatinine Clearance: 53.6 mL/min (by C-G formula based on SCr of 0.85 mg/dL). Recent Labs  Lab 02/21/21 0507 02/21/21 0714 02/22/21 0413 02/22/21 1929 02/23/21 0449 02/24/21 0403 02/25/21 0407  PROCALCITON 0.17  --  0.21  --  1.98 1.33 1.46  WBC 6.8  --  5.8  --  6.0  --  5.5  LATICACIDVEN 2.6* 3.2*  --  1.7 2.1*  --   --      Liver Function Tests: Recent Labs  Lab 02/20/21 1137  AST 59*  ALT 41  ALKPHOS 69  BILITOT 0.9  PROT 7.7  ALBUMIN 3.9    No results for input(s): LIPASE, AMYLASE in the last  168 hours. No results for input(s): AMMONIA in the last 168 hours.  ABG    Component Value Date/Time   PHART 7.44 02/03/2020 1055   PCO2ART 53 (H) 02/03/2020 1055   PO2ART 92 02/03/2020 1055   HCO3 36.0 (H) 02/03/2020 1055   ACIDBASEDEF 0.9 01/19/2020 1024   O2SAT 97.4 02/03/2020 1055      Coagulation Profile: Recent Labs  Lab 02/20/21 1423  INR 1.1     Cardiac Enzymes: No results for input(s): CKTOTAL, CKMB, CKMBINDEX, TROPONINI in the last 168 hours.  HbA1C: Hgb A1c MFr Bld  Date/Time Value Ref Range Status  01/20/2020 07:19 AM 6.3 (H) 4.8 - 5.6 % Final    Comment:    (NOTE) Pre diabetes:          5.7%-6.4%  Diabetes:              >  6.4%  Glycemic control for   <7.0% adults with diabetes   03/21/2019 03:24 AM 6.6 (H) 4.8 - 5.6 % Final    Comment:    (NOTE) Pre diabetes:          5.7%-6.4% Diabetes:              >6.4% Glycemic control for   <7.0% adults with diabetes     CBG: Recent Labs  Lab 02/24/21 1512 02/24/21 1917 02/24/21 2314 02/25/21 0309 02/25/21 0716  GLUCAP 141* 311* 281* 209* 191*     Assessment & Plan:  Acute  on Chronic Hypoxic & Respiratory Failure Secondary to suspected aspiration pneumonia in the setting of 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 Hx: Tracheal stenosis  Severe ACUTE Hypoxic and Hypercapnic Respiratory Failure -continue Mechanical Ventilator support -continue Bronchodilator Therapy -Wean Fio2 and PEEP as tolerated -VAP/VENT bundle implementation Plan for PS mode  INFECTIOUS DISEASE +aspiration pneumonia -continue antibiotics as prescribed -follow up cultures   Sepsis due to ASPIRATION Pneumonia Met sepsis criteria   Possible subcutaneous periumbilical abscess CT Abdomen/Pelvis shows shows a potential small focal subcutaneous fluid collection to the left of the umbilicus measuring 2.4 x 1.5 x 1.2 cm concerning for possible abscess with G-tube is being pulled too tightly. -Keep NPO -General surgery consult  appreciated   ENDO - ICU hypoglycemic\Hyperglycemia protocol -check FSBS per protocol      GI GI PROPHYLAXIS as indicated  NUTRITIONAL STATUS DIET-->HOLD TF's Constipation protocol as indicated    ELECTROLYTES -follow labs as needed -replace as needed -pharmacy consultation and following     Best practice:  Diet:  Tube Feed  Pain/Anxiety/Delirium protocol (if indicated): No VAP protocol (if indicated): Yes DVT prophylaxis: Subcutaneous Heparin GI prophylaxis: PPI Glucose control:  SSI Yes Central venous access:  N/A Arterial line:  N/A Foley:  N/A Mobility:  bed rest  PT consulted: N/A Last date of multidisciplinary goals of care discussion [12/26] Code Status:  limited Disposition: Step down    Critical Care Time devoted to patient care services described in this note is 45 minutes.    Corrin Parker, M.D.  [facility]  Medical Director [place] Director [facility] Cardio-Pulmonary Department     .

## 2021-02-25 NOTE — Progress Notes (Addendum)
Patient ID: Angela David, female   DOB: 03/12/1940, 80 y.o.   MRN: [id number]     [place] Hospital Day(s): 5.   Interval History: I was requested to evaluate patient due to leakage through the gastrostomy tube.  As per chart evaluation patient has had multiple intra-abdominal surgery including laparoscopic right colectomy, gastrostomy tube placement, ventral hernia repair.  She has been admitted due to hypoxia.  Patient also has a tracheostomy and is ventilator dependent intermittently.  In the last few days he has been very difficult to wean him off of the ventilation.  Last night she had an episode of aspiration after feeding bolus through the gastrostomy.  Patient currently on the vent.  Unable to assess alertness, orientation or pain.  I personally evaluated the images of the CT scan done on 02/20/2021.  Gastrostomy in place.  Vital signs in last 24 hours: [min-max] current  Temp:  [98.3 F (36.8 C)-101.9 F (38.8 C)] 101.1 F (38.4 C) (12/31 1015) Pulse Rate:  [95-132] 105 (12/31 1015) Resp:  [16-43] 16 (12/31 1015) BP: (106-185)/(59-106) 106/59 (12/31 1015) SpO2:  [87 %-100 %] 95 % (12/31 1015) FiO2 (%):  [30 %-100 %] 40 % (12/31 1015) Weight:  [73.4 kg] 73.4 kg (12/31 0316)     Height: 5\' 4"  (162.6 cm) Weight: 73.4 kg BMI (Calculated): 27.76   Physical Exam:  Constitutional: Sedated, on mechanical ventilation Respiratory: breathing non-labored at rest on mechanical ventilation Cardiovascular: regular rate and sinus rhythm  Gastrointestinal: soft, non-tender, and non-distended.  Gastrostomy in place.  No drainage or cellulitis.  Labs:  CBC Latest Ref Rng & Units 02/25/2021 02/23/2021 02/22/2021  WBC 4.0 - 10.5 K/uL 5.5 6.0 5.8  Hemoglobin 12.0 - 15.0 g/dL 13.5 13.2 12.5  Hematocrit 36.0 - 46.0 % 43.5 42.0 38.2  Platelets 150 - 400 K/uL 171 143(L) 115(L)   CMP Latest Ref Rng & Units 02/25/2021 02/24/2021 02/23/2021  Glucose 70 - 99 mg/dL 231(H)  157(H) 303(H)  BUN 8 - 23 mg/dL 28(H) 30(H) 25(H)  Creatinine 0.44 - 1.00 mg/dL 0.85 0.87 0.88  Sodium 135 - 145 mmol/L 142 139 133(L)  Potassium 3.5 - 5.1 mmol/L 4.3 3.3(L) 3.8  Chloride 98 - 111 mmol/L 101 101 98  CO2 22 - 32 mmol/L 32 29 24  Calcium 8.9 - 10.3 mg/dL 8.2(L) 7.9(L) 7.9(L)  Total Protein 6.5 - 8.1 g/dL - - -  Total Bilirubin 0.3 - 1.2 mg/dL - - -  Alkaline Phos 38 - 126 U/L - - -  AST 15 - 41 U/L - - -  ALT 0 - 44 U/L - - -    Imaging studies: I personally evaluated the CT scan of the abdomen and pelvis done on 02/20/2021.  Gastrostomy is in the right location.  No free air.  No free fluid.   Assessment/Plan:  80 y.o. female with respiratory failure on mechanical ventilation, complicated by pertinent comorbidities including chronic respiratory failure, diabetes, history of breast cancer, tracheostomy dependent.  Patient with suspected recurrent aspiration pneumonia.  Imaging and physical exam are not concerning of misplacement or dislodgment of gastrostomy.  I do not see any contraindication to restart feeding through the gastrostomy at this moment.  I will do recommend to start at slow rate and assess if patient's is tolerating.  May consider Reglan if gastroparesis could be causing and delayed gastric emptying.  At this moment I do not think that patient needs any new imaging.  If patient does not  tolerate feeding and continue with aspiration pneumonia then upper GI series with oral contrast to the gastrostomy can be helpful.  If gastroparesis is not the cause of her reflux and aspiration another option is to consider exchanging the gastrostomy tube to a gastrojejunostomy tube under fluoroscopic guidance.  [facility] grouip will continue to follow for further recommendations.  Arnold Long, MD

## 2021-02-25 NOTE — Progress Notes (Addendum)
Patient will only open eyes to pain and sometimes to voice at start of the shift. Dr Mortimer Fries informed. Advise to observe the pt and will scan pt's head in the morning if still the same. Per pt's husband and Dr Mortimer Fries, pt has low tolerance to sedating meds. Can use pt's PEG tube for medication administration as per Dr Windell Moment. Will continue to monitor.

## 2021-02-26 ENCOUNTER — Inpatient Hospital Stay: Payer: Medicare Other

## 2021-02-26 DIAGNOSIS — J9601 Acute respiratory failure with hypoxia: Secondary | ICD-10-CM | POA: Diagnosis not present

## 2021-02-26 DIAGNOSIS — A419 Sepsis, unspecified organism: Secondary | ICD-10-CM | POA: Diagnosis not present

## 2021-02-26 DIAGNOSIS — R6520 Severe sepsis without septic shock: Secondary | ICD-10-CM | POA: Diagnosis not present

## 2021-02-26 DIAGNOSIS — J690 Pneumonitis due to inhalation of food and vomit: Secondary | ICD-10-CM

## 2021-02-26 DIAGNOSIS — R0603 Acute respiratory distress: Secondary | ICD-10-CM | POA: Diagnosis not present

## 2021-02-26 LAB — URINALYSIS, COMPLETE (UACMP) WITH MICROSCOPIC
Bacteria, UA: NONE SEEN
Bilirubin Urine: NEGATIVE
Glucose, UA: NEGATIVE mg/dL
Ketones, ur: NEGATIVE mg/dL
Leukocytes,Ua: NEGATIVE
Nitrite: NEGATIVE
Protein, ur: 100 mg/dL — AB
Specific Gravity, Urine: 1.033 — ABNORMAL HIGH (ref 1.005–1.030)
pH: 5 (ref 5.0–8.0)

## 2021-02-26 LAB — GLUCOSE, CAPILLARY
Glucose-Capillary: 115 mg/dL — ABNORMAL HIGH (ref 70–99)
Glucose-Capillary: 134 mg/dL — ABNORMAL HIGH (ref 70–99)
Glucose-Capillary: 145 mg/dL — ABNORMAL HIGH (ref 70–99)
Glucose-Capillary: 165 mg/dL — ABNORMAL HIGH (ref 70–99)
Glucose-Capillary: 180 mg/dL — ABNORMAL HIGH (ref 70–99)
Glucose-Capillary: 197 mg/dL — ABNORMAL HIGH (ref 70–99)

## 2021-02-26 LAB — COMPREHENSIVE METABOLIC PANEL
ALT: 26 U/L (ref 0–44)
AST: 30 U/L (ref 15–41)
Albumin: 2.7 g/dL — ABNORMAL LOW (ref 3.5–5.0)
Alkaline Phosphatase: 77 U/L (ref 38–126)
Anion gap: 10 (ref 5–15)
BUN: 36 mg/dL — ABNORMAL HIGH (ref 8–23)
CO2: 32 mmol/L (ref 22–32)
Calcium: 8.5 mg/dL — ABNORMAL LOW (ref 8.9–10.3)
Chloride: 102 mmol/L (ref 98–111)
Creatinine, Ser: 1.14 mg/dL — ABNORMAL HIGH (ref 0.44–1.00)
GFR, Estimated: 49 mL/min — ABNORMAL LOW (ref >60)
Glucose, Bld: 198 mg/dL — ABNORMAL HIGH (ref 70–99)
Potassium: 3.4 mmol/L — ABNORMAL LOW (ref 3.5–5.1)
Sodium: 144 mmol/L (ref 135–145)
Total Bilirubin: 0.8 mg/dL (ref 0.3–1.2)
Total Protein: 6.8 g/dL (ref 6.5–8.1)

## 2021-02-26 LAB — MAGNESIUM: Magnesium: 2 mg/dL (ref 1.7–2.4)

## 2021-02-26 LAB — CBC
HCT: 43 % (ref 36.0–46.0)
Hemoglobin: 13.1 g/dL (ref 12.0–15.0)
MCH: 27.1 pg (ref 26.0–34.0)
MCHC: 30.5 g/dL (ref 30.0–36.0)
MCV: 88.8 fL (ref 80.0–100.0)
Platelets: 180 10*3/uL (ref 150–400)
RBC: 4.84 MIL/uL (ref 3.87–5.11)
RDW: 14.5 % (ref 11.5–15.5)
WBC: 7.6 10*3/uL (ref 4.0–10.5)
nRBC: 0 % (ref 0.0–0.2)

## 2021-02-26 LAB — CULTURE, BLOOD (ROUTINE X 2)
Culture: NO GROWTH
Special Requests: ADEQUATE

## 2021-02-26 LAB — POTASSIUM: Potassium: 3.3 mmol/L — ABNORMAL LOW (ref 3.5–5.1)

## 2021-02-26 LAB — MRSA NEXT GEN BY PCR, NASAL: MRSA by PCR Next Gen: NOT DETECTED

## 2021-02-26 LAB — PHOSPHORUS
Phosphorus: 1.6 mg/dL — ABNORMAL LOW (ref 2.5–4.6)
Phosphorus: 2.3 mg/dL — ABNORMAL LOW (ref 2.5–4.6)

## 2021-02-26 IMAGING — CR DG CHEST 2V
1 series · 2 of 2 positions shown · non-contrast
Comparison: 01/07/2019 in CT of the chest on 12/25/2018

CLINICAL DATA: Pt has multiple right side rib fractures due to a
fall 3 weeks ago. Pt still has pain posterior right side over right
scapula. History of breast cancerRib Cage Pain

EXAM:
CHEST - 2 VIEW

[Series 1: dg chest 2 view · 0.14mm/px · 2 of 2 slices shown]
[im 1/2]
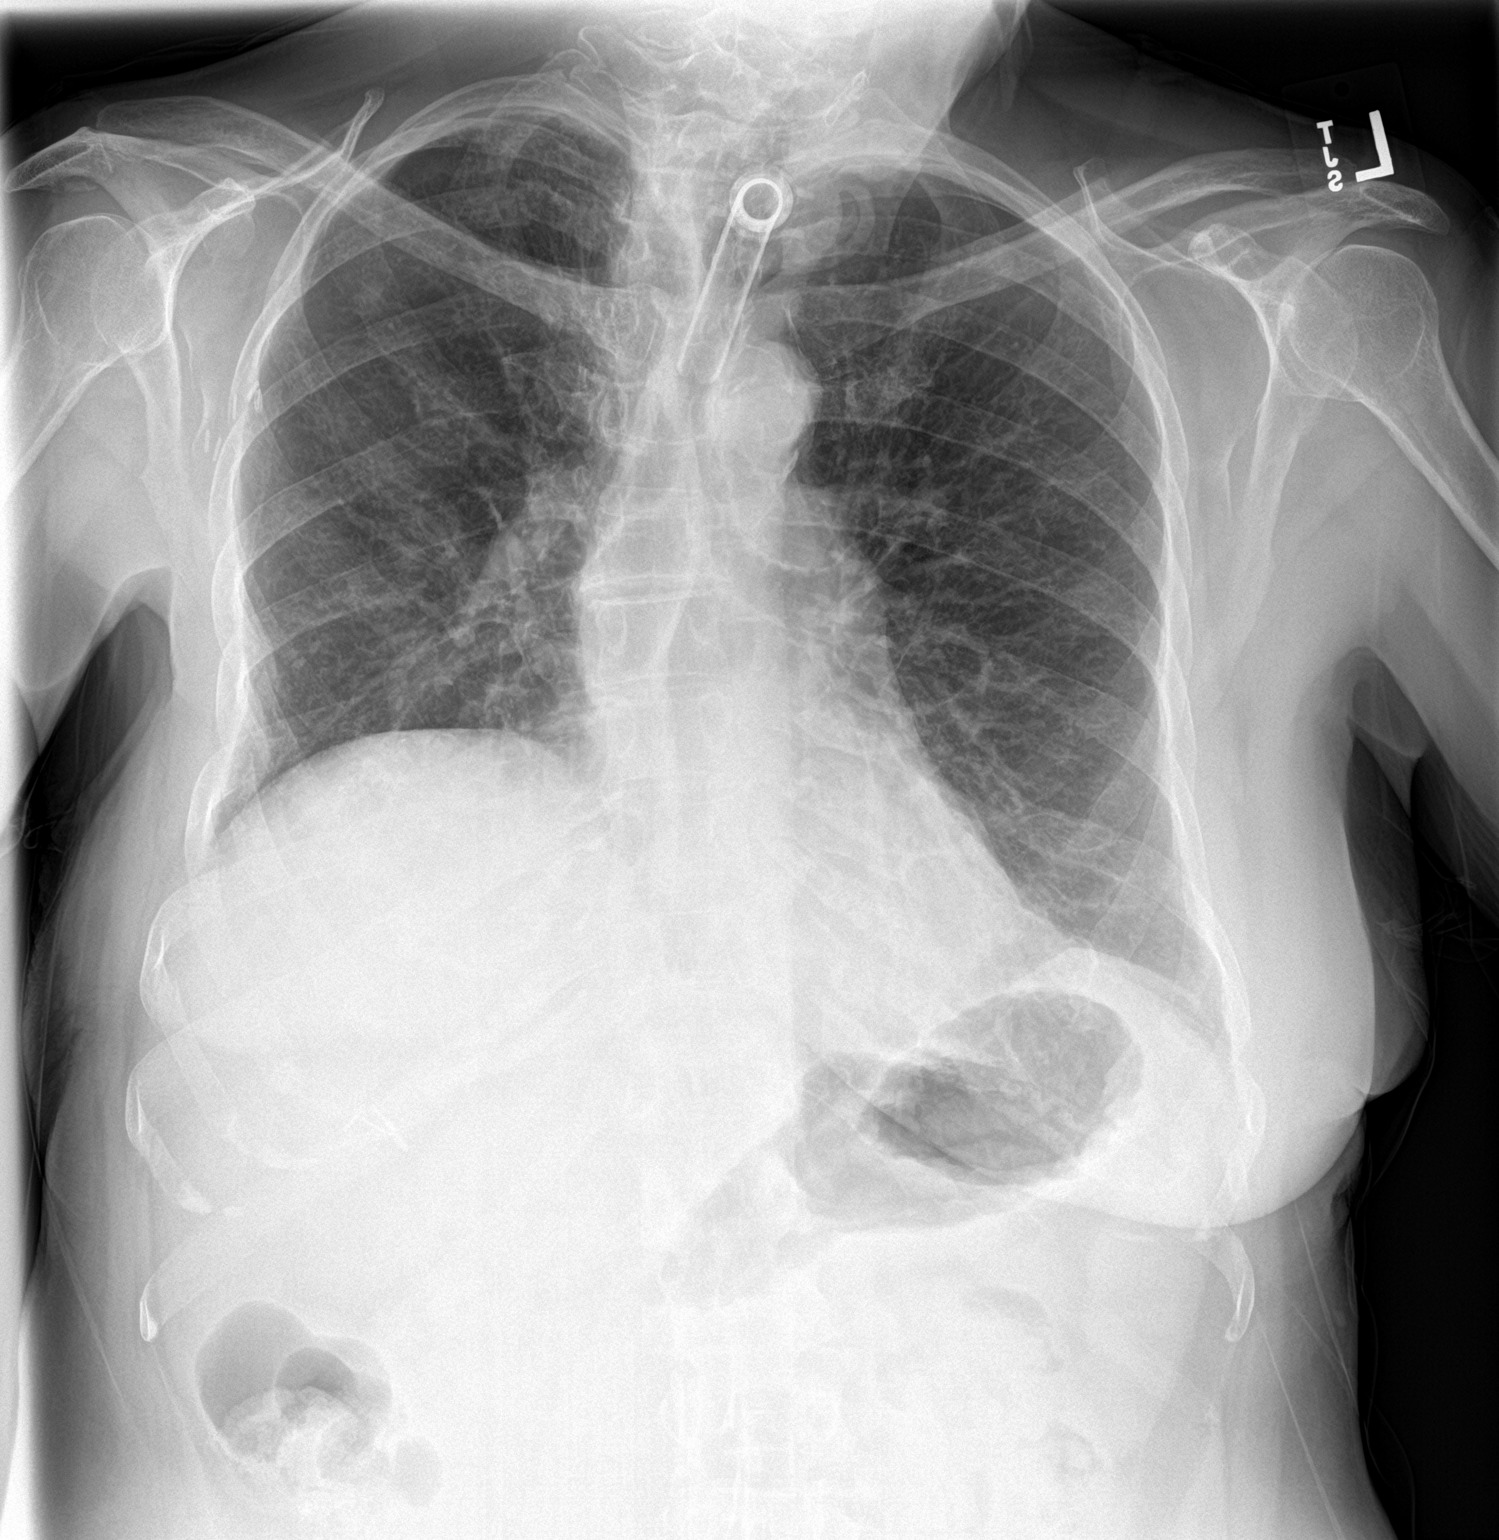
[im 2/2]
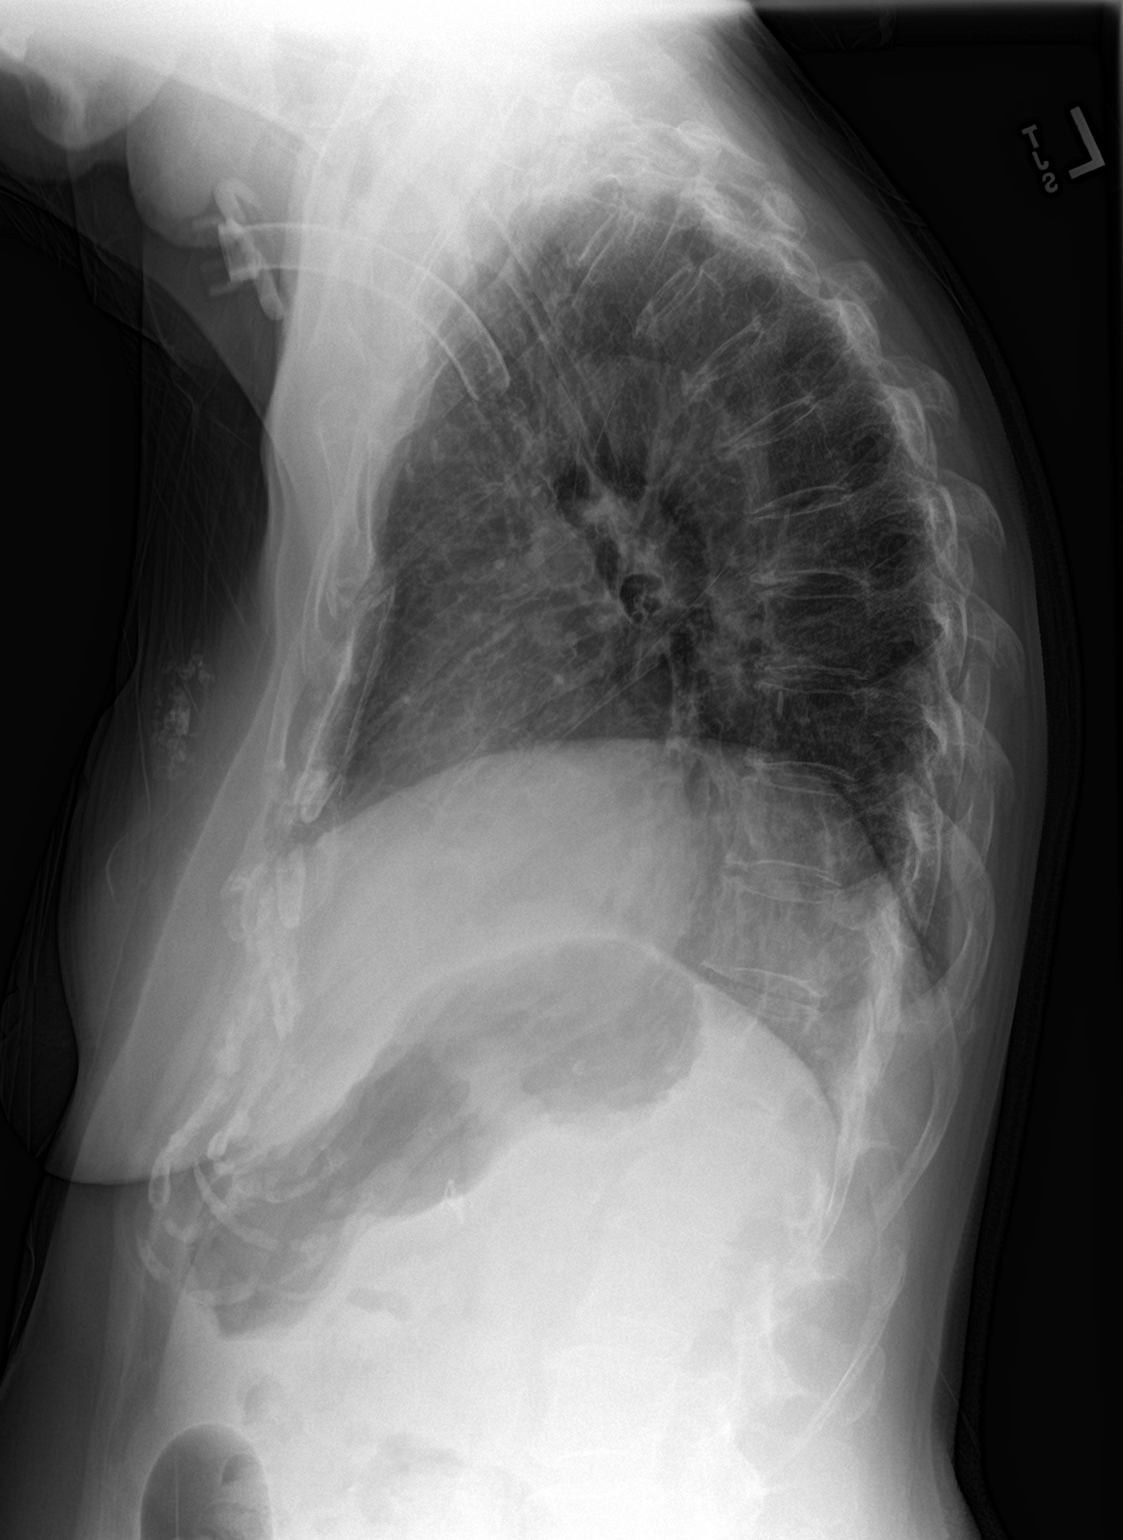

[2 of 2 positions shown; findings below may reference images not displayed]

FINDINGS: Stable elevation of the RIGHT hemidiaphragm. Heart size is normal.
Tracheostomy tube in good position. Subsegmental atelectasis at the
LEFT lung base, stable in appearance. There is no pneumothorax. BB
numerous RIGHT-sided rib fractures are identified, involving at
least RIGHT ribs 4 through 10. Remote LEFT rib fractures. No frank
consolidation. Surgical clips are identified in the RIGHT axillary
region. RIGHT mastectomy. Stable appearance of small RIGHT pulmonary
nodule. Previous CT exam showed long-term stability of multiple
pulmonary nodules.
IMPRESSION: 1. Stable appearance of the chest.
2. No evidence for acute cardiopulmonary abnormality.
3. Multiple acute RIGHT rib fractures, involving ribs 4 through 10.

## 2021-02-26 MED ORDER — VANCOMYCIN HCL 1500 MG/300ML IV SOLN
1500.0000 mg | Freq: Once | INTRAVENOUS | Status: AC
  Administered 2021-02-26: 1500 mg via INTRAVENOUS
  Filled 2021-02-26: qty 300

## 2021-02-26 MED ORDER — IOHEXOL 300 MG/ML  SOLN
100.0000 mL | Freq: Once | INTRAMUSCULAR | Status: AC | PRN
  Administered 2021-02-26: 100 mL via INTRAVENOUS

## 2021-02-26 MED ORDER — POTASSIUM PHOSPHATES 15 MMOLE/5ML IV SOLN
45.0000 mmol | Freq: Once | INTRAVENOUS | Status: AC
  Administered 2021-02-26: 45 mmol via INTRAVENOUS
  Filled 2021-02-26: qty 15

## 2021-02-26 MED ORDER — ACETAMINOPHEN 650 MG RE SUPP: 650.0000 mg | RECTAL | Status: DC | PRN

## 2021-02-26 MED ORDER — DIAZEPAM 5 MG PO TABS
5.0000 mg | ORAL_TABLET | Freq: Three times a day (TID) | ORAL | Status: DC | PRN
  Administered 2021-02-26: 5 mg via ORAL
  Filled 2021-02-26: qty 1

## 2021-02-26 MED ORDER — CHLORHEXIDINE GLUCONATE CLOTH 2 % EX PADS
6.0000 | MEDICATED_PAD | Freq: Every day | CUTANEOUS | Status: DC
  Administered 2021-02-26 – 2021-03-06 (×9): 6 via TOPICAL

## 2021-02-26 MED ORDER — LACTATED RINGERS IV BOLUS: 1000.0000 mL | Freq: Once | INTRAVENOUS | Status: DC

## 2021-02-26 MED ORDER — POTASSIUM PHOSPHATES 15 MMOLE/5ML IV SOLN
15.0000 mmol | Freq: Once | INTRAVENOUS | Status: AC
  Administered 2021-02-27: 15 mmol via INTRAVENOUS
  Filled 2021-02-26: qty 5

## 2021-02-26 MED ORDER — VANCOMYCIN HCL IN DEXTROSE 1-5 GM/200ML-% IV SOLN
1000.0000 mg | INTRAVENOUS | Status: DC
  Filled 2021-02-26: qty 200

## 2021-02-26 MED ORDER — SODIUM CHLORIDE 0.9 % IV BOLUS
1000.0000 mL | Freq: Once | INTRAVENOUS | Status: AC
  Administered 2021-02-26: 1000 mL via INTRAVENOUS

## 2021-02-26 NOTE — Progress Notes (Signed)
Patient arousable to voice instead of just pain and movement at this time.  Range of motion with bilateral lower extremities performed.  Patient able to nod and easily comforted back to a resting state.

## 2021-02-26 NOTE — Plan of Care (Signed)
Discussed with husband in front of patient plan of care for the evening, pain management and holding sleeping/sedation medications at bedtime with some teach back displayed by family.  Patient is currently on a trach/vent.  Problem: Education: Goal: Knowledge of General Education information will improve Description: Including pain rating scale, medication(s)/side effects and non-pharmacologic comfort measures Outcome: Progressing   Problem: Health Behavior/Discharge Planning: Goal: Ability to manage health-related needs will improve Outcome: Progressing

## 2021-02-26 NOTE — Progress Notes (Signed)
Pharmacy Antibiotic Note  Angela David is a 81 y.o. female admitted on 02/20/2021.  Pharmacy has been consulted for cefepime and vancomycin dosing for abscess. Pt was recently placed on Augmentin for possible cellulitis as an outpatient. She is spiking fevers with ongoing antibiotics and new cultures/tests have been ordered  Plan:  1) continue cefepime  2 grams IV every 12 hours  2) start vancomycin 1500 mg IV x 1 then 1000 mg IV every 24 hours Goal AUC 400-550 Expected AUC: 561.7 SCr used: 1.14 mg/dL Ke: 0.034 h-1, T1/2: 20.7 h  Will continue to monitor renal function and adjust dose as clinically indicated  Height: 5' 4.02" (162.6 cm) Weight: 73.7 kg (162 lb 7.7 oz) (75.3-1.6) IBW/kg (Calculated) : 54.74  Temp (24hrs), Avg:100.8 F (38.2 C), Min:98.7 F (37.1 C), Max:102 F (38.9 C)  Recent Labs  Lab 02/20/21 1137 02/21/21 0507 02/21/21 0714 02/22/21 0413 02/22/21 1929 02/23/21 0449 02/24/21 0403 02/25/21 0407 02/26/21 0414  WBC 10.0 6.8  --  5.8  --  6.0  --  5.5 7.6  CREATININE 0.94 0.84  --  0.71  --  0.88 0.87 0.85 1.14*  LATICACIDVEN 2.5* 2.6* 3.2*  --  1.7 2.1*  --   --   --      Estimated Creatinine Clearance: 40 mL/min (A) (by C-G formula based on SCr of 1.14 mg/dL (H)).    Allergies  Allergen Reactions   Shellfish Allergy Shortness Of Breath    respitatory distress    Sodium Sulfite Shortness Of Breath    Severe Congestion that creates inability to breath   Sulfa Antibiotics Shortness Of Breath and Other (See Comments)    respitatory distress   Codeine Other (See Comments)    Per patient "it kept her up all night"   Glucerna [Alimentum] Itching   Wound Dressing Adhesive Rash    Antimicrobials this admission: 12/26 vancomycin >> 12/29    01/01 >> 12/30 metronidazole >> 12/26 cefepime >>  Microbiology results: 01/01 BCx: pending 01/01 UCx: pending 12/28 BCx: NGTD 12/26 BCx: NG final 12/30 RCx: NG final 12/26 MRSA PCR: not detected   12/26 respiratory panel: negative 12/26 SARS CoV-2: negative 12/26 influenza A/B: negative  Thank you for allowing pharmacy to be a part of this patients care.  Dallie Piles, PharmD 02/26/2021 8:39 AM

## 2021-02-26 NOTE — Progress Notes (Signed)
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  Reported as high as 101.0 with associated shortness of breath and dry cough.  Also with generalized abdominal pain.  Denies urinary complaints.   In the ED, temperature 101.5 F, BP 126/65, HR 111, RR 27, SPO2 87% room air.  CT abdomen/pelvis with potential small focus subcutaneous fluid collection left of the umbilicus measuring 2.4 x 1.5 x 1.2 cm concerning for possible abscess.  General surgery consulted for further evaluation.  Patient now with also O2 requirement with possible pneumonia but no infiltrates on x-ray.  PCCM was consulted for assistance with vent management.  Patient was started on broad-spectrum antibiotics and admitted to the hospitalist service.   Assessment & Plan:   Principal Problem:   Sepsis ([place]) Active Problems:   Acute pulmonary edema (HCC)   Somnolence  Acute on chronic hypoxic respiratory failure secondary to suspected aspiration pneumonia with history of chronic tracheostomy and tracheal stenosis Aspiration pneumonia Patient presenting to ED with shortness of breath, fever. Chest x-ray today with increased vascular congestion concerning for pulmonary edema. CT chest without contrast with moderate size right pleural effusion with overlying atelectasis, peribronchial thickening and bilateral lower lobe airspace consolidation likely pneumonia, from aspiration. -1/1: Patient breakthrough fever 101.7 Plan: Add vancomycin pharmacy dosing Continue cefepime 2 g IV every 12  hours Continue Flagyl 500 mg IV every 8 hours Reculture blood, urine Recheck chest x-ray Follow-up previous blood cultures, NGTD Hold IV diuretics PCCM following for vent management Strict ins and outs, daily weights Aspiration precautions   Sepsis, POA suspected abdominal/subcutaneous periumbilical abscess CT abdomen/pelvis shows a potential small focal subcutaneous fluid collection to the left of the umbilicus measuring 2.4 x 1.5 x 1.2 cm concerning for possible abscess with a G-tube being pulled too tightly.  Sepsis criteria met on admission with tachycardia, leukocytosis, fevers with likely source of pneumonia versus abdominal abscess. --Seen by general surgery, no intervention at this time is believes that this area of concern is a remnant of hernia sac versus postoperative seroma from her hernia repair, rather than abscess/infection.  Reevaluated by general surgery on 12/31, no change and no recommendation of repeat imaging -- Breakthrough fever noted 1/1 Plan: Antibiotics as above Check CT abdomen pelvis with contrast   Type 2 diabetes mellitus Holding home Glucophage. --Levemir 10 units subcutaneously daily --NovoLog 6 units [place] QID --SSI for coverage --CBG every 4 hours   Essential hypertension --Metoprolol 25 mg daily   GERD: Protonix 40 mg daily   Restless leg syndrome: Ropinirole 0.5 mg daily   Depression/anxiety: --Lexapro 30 mg daily --BuSpar 50 mg twice daily -As needed Valium substituted for Xanax   Hyperlipidemia: Pravastatin 20 mg daily   Hypophosphatemia resolved Repleted.  Phosphorus 3.0 this morning.   Weakness/debility/deconditioning: --Seen by PT/OT with recommendation of SNF placement --TOC for evaluation --Continue therapy efforts while inpatient   DVT prophylaxis: SQ heparin Code Status: Partial Family Communication: None today Disposition Plan: Status is: Inpatient  Remains inpatient appropriate because: Critically ill.  Ventilator  dependent.  Recurrent fevers.  Sepsis.  Disposition plan pending.     Level of care:  ICU  Consultants:  PCCM  Procedures:  None  Antimicrobials: Vancomycin Cefepime Metronidazole   Subjective: Patient seen and examined.  On vent.  Lethargic but does awaken.  Follows commands.  History is limited.  Objective: Vitals:   02/26/21 1200 02/26/21 1300 02/26/21 1330 02/26/21 1400  BP:   126/74 110/67  Pulse: 95 96 99 96  Resp: '17 17 17 18  ' Temp: 98.1 F (36.7 C)     TempSrc: Axillary     SpO2: 99% 99% 98% 98%  Weight:      Height:        Intake/Output Summary (Last 24 hours) at 02/26/2021 1404 Last data filed at 02/26/2021 1148 Gross per 24 hour  Intake 2105.59 ml  Output 1550 ml  Net 555.59 ml   Filed Weights   02/24/21 0416 02/25/21 0316 02/26/21 0402  Weight: 78.7 kg 73.4 kg 73.7 kg    Examination:  General exam: Appears lethargic.  On vent via trach.  Chronically ill Respiratory system: Coarse breath sounds bilaterally.  Increased respiratory distress Cardiovascular system: S1-S2, RRR, no murmurs, no pedal edema Gastrointestinal system: Soft, NT/ND, positive bowel sounds, PEG tube in place Central nervous system: Alert, oriented x2, no focal deficits Extremities: Symmetrical decreased power bilateral lower extremities Skin: No rashes, lesions or ulcers Psychiatry: Judgement and insight appear impaired. Mood & affect flattened.     Data Reviewed: I have personally reviewed following labs and imaging studies  CBC: Recent Labs  Lab 02/20/21 1137 02/21/21 0507 02/22/21 0413 02/23/21 0449 02/25/21 0407 02/26/21 0414  WBC 10.0 6.8 5.8 6.0 5.5 7.6  NEUTROABS 8.9*  --   --   --   --   --   HGB 12.5 11.1* 12.5 13.2 13.5 13.1  HCT 38.8 34.8* 38.2 42.0 43.5 43.0  MCV 84.7 84.7 85.1 85.0 86.7 88.8  PLT 130* 113* 115* 143* 171 664   Basic Metabolic Panel: Recent Labs  Lab 02/22/21 0413 02/23/21 0449 02/24/21 0403 02/25/21 0407 02/26/21 0414  NA 134*  133* 139 142 144  K 4.3 3.8 3.3* 4.3 3.4*  CL 102 98 101 101 102  CO2 '24 24 29 ' 32 32  GLUCOSE 159* 303* 157* 231* 198*  BUN 13 25* 30* 28* 36*  CREATININE 0.71 0.88 0.87 0.85 1.14*  CALCIUM 8.8* 7.9* 7.9* 8.2* 8.5*  MG 1.9 1.9 1.8 2.4 2.0  PHOS 1.6* 1.9* 1.7* 3.0 1.6*   GFR: Estimated Creatinine Clearance: 40 mL/min (A) (by C-G formula based on SCr of 1.14 mg/dL (H)). Liver Function Tests: Recent Labs  Lab 02/20/21 1137 02/26/21 0414  AST 59* 30  ALT 41 26  ALKPHOS 69 77  BILITOT 0.9 0.8  PROT 7.7 6.8  ALBUMIN 3.9 2.7*   No results for input(s): LIPASE, AMYLASE in the last 168 hours. No results for input(s): AMMONIA in the last 168 hours. Coagulation Profile: Recent Labs  Lab 02/20/21 1423  INR 1.1   Cardiac Enzymes: No results for input(s): CKTOTAL, CKMB, CKMBINDEX, TROPONINI in the last 168 hours. BNP (last 3 results) No results for input(s): PROBNP in the last 8760 hours. HbA1C: No results for input(s): HGBA1C in the last 72 hours. CBG: Recent Labs  Lab 02/25/21 1914 02/25/21 2258 02/26/21 0348 02/26/21 0714 02/26/21 1135  GLUCAP 156* 204* 165* 197* 180*   Lipid Profile: No results for input(s): CHOL, HDL, LDLCALC, TRIG, CHOLHDL, LDLDIRECT in the last 72 hours. Thyroid Function Tests: No results for input(s): TSH, T4TOTAL, FREET4, T3FREE, THYROIDAB in the last 72  hours. Anemia Panel: No results for input(s): VITAMINB12, FOLATE, FERRITIN, TIBC, IRON, RETICCTPCT in the last 72 hours. Sepsis Labs: Recent Labs  Lab 02/21/21 0507 02/21/21 0714 02/22/21 0413 02/22/21 1929 02/23/21 0449 02/24/21 0403 02/25/21 0407  PROCALCITON 0.17  --  0.21  --  1.98 1.33 1.46  LATICACIDVEN 2.6* 3.2*  --  1.7 2.1*  --   --     Recent Results (from the past 240 hour(s))  Blood culture (routine x 2)     Status: None   Collection Time: 02/20/21 11:37 AM   Specimen: BLOOD  Result Value Ref Range Status   Specimen Description BLOOD BLOOD LEFT FOREARM  Final   Special  Requests   Final    BOTTLES DRAWN AEROBIC AND ANAEROBIC Blood Culture results may not be optimal due to an excessive volume of blood received in culture bottles   Culture   Final    NO GROWTH 5 DAYS Performed at [facility], [address] [place], [place] [address]    Report Status 02/25/2021 FINAL  Final  Resp Panel by RT-PCR (Flu A&B, Covid)     Status: None   Collection Time: 02/20/21  2:23 PM   Specimen: Nasopharyngeal(NP) swabs in vial transport medium  Result Value Ref Range Status   SARS Coronavirus 2 by RT PCR NEGATIVE NEGATIVE Final    Comment: (NOTE) SARS-CoV-2 target nucleic acids are NOT DETECTED.  The SARS-CoV-2 RNA is generally detectable in upper respiratory specimens during the acute phase of infection. The lowest concentration of SARS-CoV-2 viral copies this assay can detect is 138 copies/mL. A negative result does not preclude SARS-Cov-2 infection and should not be used as the sole basis for treatment or other patient management decisions. A negative result may occur with  improper specimen collection/handling, submission of specimen other than nasopharyngeal swab, presence of viral mutation(s) within the areas targeted by this assay, and inadequate number of viral copies(<138 copies/mL). A negative result must be combined with clinical observations, patient history, and epidemiological information. The expected result is Negative.  Fact Sheet for Patients:  EntrepreneurPulse.com.au  Fact Sheet for Healthcare Providers:  IncredibleEmployment.be  This test is no t yet approved or cleared by the [place] FDA and  has been authorized for detection and/or diagnosis of SARS-CoV-2 by FDA under an Emergency Use Authorization (EUA). This EUA will remain  in effect (meaning this test can be used) for the duration of the COVID-19 declaration under Section 564(b)(1) of the Act, 21 U.S.C.section 360bbb-3(b)(1), unless  the authorization is terminated  or revoked sooner.       Influenza A by PCR NEGATIVE NEGATIVE Final   Influenza B by PCR NEGATIVE NEGATIVE Final    Comment: (NOTE) The Xpert Xpress SARS-CoV-2/FLU/RSV plus assay is intended as an aid in the diagnosis of influenza from Nasopharyngeal swab specimens and should not be used as a sole basis for treatment. Nasal washings and aspirates are unacceptable for Xpert Xpress SARS-CoV-2/FLU/RSV testing.  Fact Sheet for Patients: EntrepreneurPulse.com.au  Fact Sheet for Healthcare Providers: IncredibleEmployment.be  This test is not yet approved or cleared by the [place] FDA and has been authorized for detection and/or diagnosis of SARS-CoV-2 by FDA under an Emergency Use Authorization (EUA). This EUA will remain in effect (meaning this test can be used) for the duration of the COVID-19 declaration under Section 564(b)(1) of the Act, 21 U.S.C. section 360bbb-3(b)(1), unless the authorization is terminated or revoked.  Performed at [facility], [address],  [place], [place] [address]   Respiratory (~20 pathogens) panel by PCR     Status: None   Collection Time: 02/20/21  4:16 PM   Specimen: Nasopharyngeal Swab; Respiratory  Result Value Ref Range Status   Adenovirus NOT DETECTED NOT DETECTED Final   Coronavirus 229E NOT DETECTED NOT DETECTED Final    Comment: (NOTE) The Coronavirus on the Respiratory Panel, DOES NOT test for the novel  Coronavirus (2019 nCoV)    Coronavirus HKU1 NOT DETECTED NOT DETECTED Final   Coronavirus NL63 NOT DETECTED NOT DETECTED Final   Coronavirus OC43 NOT DETECTED NOT DETECTED Final   Metapneumovirus NOT DETECTED NOT DETECTED Final   Rhinovirus / Enterovirus NOT DETECTED NOT DETECTED Final   Influenza A NOT DETECTED NOT DETECTED Final   Influenza B NOT DETECTED NOT DETECTED Final   Parainfluenza Virus 1 NOT DETECTED NOT DETECTED Final   Parainfluenza  Virus 2 NOT DETECTED NOT DETECTED Final   Parainfluenza Virus 3 NOT DETECTED NOT DETECTED Final   Parainfluenza Virus 4 NOT DETECTED NOT DETECTED Final   Respiratory Syncytial Virus NOT DETECTED NOT DETECTED Final   Bordetella pertussis NOT DETECTED NOT DETECTED Final   Bordetella Parapertussis NOT DETECTED NOT DETECTED Final   Chlamydophila pneumoniae NOT DETECTED NOT DETECTED Final   Mycoplasma pneumoniae NOT DETECTED NOT DETECTED Final    Comment: Performed at [facility], [place]. [address] [place], [place] [address]  MRSA Next Gen by PCR, Nasal     Status: None   Collection Time: 02/21/21  4:16 AM   Specimen: Nasal Mucosa; Nasal Swab  Result Value Ref Range Status   MRSA by PCR Next Gen NOT DETECTED NOT DETECTED Final    Comment: (NOTE) The GeneXpert MRSA Assay (FDA approved for NASAL specimens only), is one component of a comprehensive MRSA colonization surveillance program. It is not intended to diagnose MRSA infection nor to guide or monitor treatment for MRSA infections. Test performance is not FDA approved in patients less than 57 years old. Performed at [facility], [address] [place], [place] [address]   Culture, blood (Routine X 2) w Reflex to ID Panel     Status: None   Collection Time: 02/21/21  9:46 PM   Specimen: BLOOD  Result Value Ref Range Status   Specimen Description BLOOD LEFT HAND  Final   Special Requests   Final    BOTTLES DRAWN AEROBIC AND ANAEROBIC Blood Culture adequate volume   Culture   Final    NO GROWTH 5 DAYS Performed at [facility], [address] [place], [place] [address]    Report Status 02/26/2021 FINAL  Final  CULTURE, BLOOD (ROUTINE X 2) w Reflex to ID Panel     Status: None (Preliminary result)   Collection Time: 02/22/21 11:51 AM   Specimen: BLOOD  Result Value Ref Range Status   Specimen Description BLOOD BLOOD LEFT HAND  Final   Special Requests   Final    BOTTLES DRAWN AEROBIC AND ANAEROBIC Blood  Culture adequate volume   Culture   Final    NO GROWTH 4 DAYS Performed at [facility], [address] [place], [place] [address]    Report Status PENDING  Incomplete  CULTURE, BLOOD (ROUTINE X 2) w Reflex to ID Panel     Status: None (Preliminary result)   Collection Time: 02/22/21 11:53 AM   Specimen: BLOOD  Result Value Ref Range Status   Specimen Description BLOOD BLOOD  Final   Special  Requests   Final    BOTTLES DRAWN AEROBIC AND ANAEROBIC Blood Culture adequate volume   Culture   Final    NO GROWTH 4 DAYS Performed at [facility], [address] [place], [place] [address]    Report Status PENDING  Incomplete  Culture, Respiratory w Gram Stain     Status: None (Preliminary result)   Collection Time: 02/24/21  9:00 PM   Specimen: Tracheal Aspirate; Respiratory  Result Value Ref Range Status   Specimen Description   Final    TRACHEAL ASPIRATE Performed at [facility], [address], [place], [place] [address]    Special Requests   Final    NONE Performed at [facility], [address] [place], [place] [address]    Gram Stain   Final    NO SQUAMOUS EPITHELIAL CELLS SEEN FEW WBC SEEN NO ORGANISMS SEEN    Culture   Final    CULTURE REINCUBATED FOR BETTER GROWTH Performed at [facility], [place] [address], [place], [place] [address]    Report Status PENDING  Incomplete  MRSA Next Gen by PCR, Nasal     Status: None   Collection Time: 02/26/21  8:27 AM   Specimen: Nasal Mucosa; Nasal Swab  Result Value Ref Range Status   MRSA by PCR Next Gen NOT DETECTED NOT DETECTED Final    Comment: (NOTE) The GeneXpert MRSA Assay (FDA approved for NASAL specimens only), is one component of a comprehensive MRSA colonization surveillance program. It is not intended to diagnose MRSA infection nor to guide or monitor treatment for MRSA infections. Test performance is not FDA approved in patients less than 48 years old. Performed at  [facility], [address], [place], [place] [address]          Radiology Studies: DG Chest [place] 1 View  Result Date: 02/26/2021 CLINICAL DATA:  Pt admitted to hospital on 12/26 for generalized malaise fever and chills also having some shortness of breath, hypoxia at 80% on room air. Pt only responsive to painful stimuli at time of imaging. Hx of CHF, chronic respiratory failure, right rib fractures. EXAM: PORTABLE CHEST 1 VIEW COMPARISON:  02/24/2021 and older exams. FINDINGS: Patchy areas of hazy airspace opacity, right greater than left, are without convincing change from the most recent prior exam. No new lung abnormalities. Stable well-positioned tracheostomy tube. No pneumothorax.  Probable small right pleural effusion. IMPRESSION: 1. No significant change from the most recent prior exam. Electronically Signed   By: Lajean Manes M.D.   On: 02/26/2021 10:44   DG Chest Port 1 View  Result Date: 02/24/2021 CLINICAL DATA:  Respiratory distress. EXAM: PORTABLE CHEST 1 VIEW COMPARISON:  One view chest x-ray 02/22/2021. CT chest without contrast 02/22/2021. FINDINGS: Tracheostomy tube is in place. Heart size is normal. Progressive right pleural effusion and right greater than left airspace disease noted. IMPRESSION: Progressive right pleural effusion and right greater than left airspace disease. Electronically Signed   By: San Morelle M.D.   On: 02/24/2021 19:24   ECHOCARDIOGRAM COMPLETE  Result Date: 02/25/2021    ECHOCARDIOGRAM REPORT   Patient Name:   Alvine SMALL Ehrman Date of Exam: 02/25/2021 Medical Rec #:  [id number]           Height:       64.0 in Accession #:    [id number]          Weight:       161.8 lb Date of Birth:  06-Jan-1941          BSA:          1.788 m Patient Age:    83 years            BP:           147/75 mmHg Patient Gender: F                   HR:           120 bpm. Exam Location:  [facility] Procedure: 2D Echo and Intracardiac Opacification Agent  Indications:     Acute Respiratory Distress  History:         Patient has prior history of Echocardiogram examinations. Risk                  Factors:Breast CA.  Sonographer:     L. Thornton-Maynard Referring Phys:  [id number] Flora Lipps Diagnosing Phys: Donnelly Angelica  Sonographer Comments: Echo performed with patient supine and on artificial respirator. IMPRESSIONS  1. Left ventricular ejection fraction, by estimation, is 55 to 60%. The left ventricle has normal function. The left ventricle has no regional wall motion abnormalities. There is moderate left ventricular hypertrophy. Left ventricular diastolic parameters are indeterminate.  2. Right ventricular systolic function is normal. The right ventricular size is not well visualized.  3. The mitral valve was not well visualized. Moderate mitral valve regurgitation. No evidence of mitral stenosis.  4. The aortic valve is grossly normal. Aortic valve regurgitation is not visualized. Mild aortic valve stenosis. Conclusion(s)/Recommendation(s): POOR ACOUSTIC WINDOWS LIMITS TOTALLY ACCURATE ASSESSMENT OF VALVULAR PATHOLOGY. FINDINGS  Left Ventricle: Left ventricular ejection fraction, by estimation, is 55 to 60%. The left ventricle has normal function. The left ventricle has no regional wall motion abnormalities. Definity contrast agent was given IV to delineate the left ventricular  endocardial borders. The left ventricular internal cavity size was normal in size. There is moderate left ventricular hypertrophy. Left ventricular diastolic parameters are indeterminate. Right Ventricle: The right ventricular size is not well visualized. Right vetricular wall thickness was not well visualized. Right ventricular systolic function is normal. Left Atrium: Left atrial size was not well visualized. Right Atrium: Right atrial size was not well visualized. Pericardium: There is no evidence of pericardial effusion. Mitral Valve: The mitral valve was not well visualized. Moderate  mitral valve regurgitation. No evidence of mitral valve stenosis. MV peak gradient, 10.6 mmHg. The mean mitral valve gradient is 4.0 mmHg. Tricuspid Valve: The tricuspid valve is not well visualized. Tricuspid valve regurgitation is trivial. Aortic Valve: The aortic valve is grossly normal. Aortic valve regurgitation is not visualized. Mild aortic stenosis is present. Aortic valve mean gradient measures 14.0 mmHg. Aortic valve peak gradient measures 21.9 mmHg. Aortic valve area, by VTI measures 1.16 cm. Pulmonic Valve: The pulmonic valve was not well visualized. No evidence of pulmonic stenosis. Aorta: The aortic root is normal in size and structure. Venous: The inferior vena cava was not well visualized. IAS/Shunts: The interatrial septum was not well visualized.  LEFT VENTRICLE PLAX 2D LVIDd:         3.85 cm     Diastology LVIDs:         3.00 cm     LV e' medial:    5.11 cm/s LV PW:         1.15 cm     LV E/e' medial:  20.5 LV IVS:        1.15 cm  LV e' lateral:   5.87 cm/s LVOT diam:     2.10 cm     LV E/e' lateral: 17.9 LV SV:         38 LV SV Index:   22 LVOT Area:     3.46 cm  LV Volumes (MOD) LV vol d, MOD A2C: 55.1 ml LV vol d, MOD A4C: 43.4 ml LV vol s, MOD A2C: 13.9 ml LV vol s, MOD A4C: 20.0 ml LV SV MOD A2C:     41.2 ml LV SV MOD A4C:     43.4 ml LV SV MOD BP:      31.9 ml RIGHT VENTRICLE RV S prime:     8.92 cm/s TAPSE (M-mode): 1.5 cm LEFT ATRIUM             Index LA diam:        2.50 cm 1.40 cm/m LA Vol (A2C):   24.1 ml 13.48 ml/m LA Vol (A4C):   28.5 ml 15.94 ml/m LA Biplane Vol: 27.0 ml 15.10 ml/m  AORTIC VALVE                     PULMONIC VALVE AV Area (Vmax):    1.20 cm      PV Vmax:       0.63 m/s AV Area (Vmean):   1.05 cm      PV Peak grad:  1.6 mmHg AV Area (VTI):     1.16 cm AV Vmax:           234.00 cm/s AV Vmean:          179.000 cm/s AV VTI:            0.332 m AV Peak Grad:      21.9 mmHg AV Mean Grad:      14.0 mmHg LVOT Vmax:         80.90 cm/s LVOT Vmean:        54.500 cm/s  LVOT VTI:          0.111 m LVOT/AV VTI ratio: 0.33  AORTA Ao Root diam: 2.60 cm Ao Asc diam:  2.80 cm MITRAL VALVE MV Area (PHT): 4.06 cm     SHUNTS MV Area VTI:   1.41 cm     Systemic VTI:  0.11 m MV Peak grad:  10.6 mmHg    Systemic Diam: 2.10 cm MV Mean grad:  4.0 mmHg MV Vmax:       1.63 m/s MV Vmean:      91.4 cm/s MV Decel Time: 187 msec MV E velocity: 105.00 cm/s MV A velocity: 115.00 cm/s MV E/A ratio:  0.91 Donnelly Angelica Electronically signed by Donnelly Angelica Signature Date/Time: 02/25/2021/12:10:48 PM    Final         Scheduled Meds:  busPIRone  15 mg Per Tube BID   chlorhexidine gluconate (MEDLINE KIT)  15 mL Mouth Rinse BID   Chlorhexidine Gluconate Cloth  6 each Topical Q0600   escitalopram  30 mg Per Tube Daily   feeding supplement (KATE [employer]ARMS STANDARD 1.4)  325 mL Per Tube QID   fentaNYL (SUBLIMAZE) injection  100 mcg Intravenous Once   folic acid  1 mg Per Tube Daily   free water  30 mL Per Tube Q4H   heparin  5,000 Units Subcutaneous Q8H   insulin aspart  0-15 Units Subcutaneous Q4H   insulin detemir  8 Units Subcutaneous BID   ipratropium-albuterol  3 mL Nebulization Q6H  loratadine  10 mg Oral Daily   mouth rinse  15 mL Mouth Rinse 10 times per day   melatonin  5 mg Oral QHS   metoprolol tartrate  25 mg Per Tube Daily   pantoprazole sodium  40 mg Per Tube Daily   pravastatin  20 mg Per Tube q1800   rOPINIRole  0.5 mg Per Tube QHS   Continuous Infusions:  ceFEPime (MAXIPIME) IV Stopped (02/26/21 7915)   metronidazole 100 mL/hr at 02/26/21 1148   [START ON 02/27/2021] vancomycin     vancomycin 1,500 mg (02/26/21 1353)     LOS: 6 days    Time spent: 35 minutes    Sidney Ace, MD [facility]   If 7PM-7AM, please contact night-coverage  02/26/2021, 2:04 PM

## 2021-02-26 NOTE — Progress Notes (Signed)
Pt transported to and from CT with RN without difficuty

## 2021-02-26 NOTE — Progress Notes (Signed)
[facility] SURGICAL PROGRESS NOTE (cpt [id number])  Hospital Day(s): 6.   Post op day(s):  Marland Kitchen   Interval History: Patient seen and examined, no acute events or new complaints overnight. Patient seen upon return from CT scanning.  Review of Systems:  Patient remains intubated through tracheostomy.  Vital signs in last 24 hours: [min-max] current  Temp:  [98.1 F (36.7 C)-101.7 F (38.7 C)] 98.1 F (36.7 C) (01/01 1200) Pulse Rate:  [95-117] 96 (01/01 1400) Resp:  [14-25] 18 (01/01 1400) BP: (110-138)/(58-76) 110/67 (01/01 1400) SpO2:  [95 %-100 %] 98 % (01/01 1400) FiO2 (%):  [40 %] 40 % (01/01 1400) Weight:  [73.7 kg] 73.7 kg (01/01 0402)     Height: 5' 4.02" (162.6 cm) Weight: 73.7 kg (75.3-1.6) BMI (Calculated): 27.88   Intake/Output last 2 shifts:  12/31 0701 - 01/01 0700 In: 1775.9 [NG/GT:1190; IV Piggyback:405.9] Out: 1600 [Urine:1600]   Physical Exam:  Constitutional: alert, cooperative and no distress  Same periumbilical tenderness to palpation of abdominal wall, without abdominal wall induration, discoloration or edema present.  Left upper quadrant gastrostomy site is unremarkable.   Labs:  CBC Latest Ref Rng & Units 02/26/2021 02/25/2021 02/23/2021  WBC 4.0 - 10.5 K/uL 7.6 5.5 6.0  Hemoglobin 12.0 - 15.0 g/dL 13.1 13.5 13.2  Hematocrit 36.0 - 46.0 % 43.0 43.5 42.0  Platelets 150 - 400 K/uL 180 171 143(L)   CMP Latest Ref Rng & Units 02/26/2021 02/25/2021 02/24/2021  Glucose 70 - 99 mg/dL 198(H) 231(H) 157(H)  BUN 8 - 23 mg/dL 36(H) 28(H) 30(H)  Creatinine 0.44 - 1.00 mg/dL 1.14(H) 0.85 0.87  Sodium 135 - 145 mmol/L 144 142 139  Potassium 3.5 - 5.1 mmol/L 3.4(L) 4.3 3.3(L)  Chloride 98 - 111 mmol/L 102 101 101  CO2 22 - 32 mmol/L 32 32 29  Calcium 8.9 - 10.3 mg/dL 8.5(L) 8.2(L) 7.9(L)  Total Protein 6.5 - 8.1 g/dL 6.8 - -  Total Bilirubin 0.3 - 1.2 mg/dL 0.8 - -  Alkaline Phos 38 - 126 U/L 77 - -  AST 15 - 41 U/L 30 - -  ALT 0 - 44 U/L 26 - -      Imaging studies:  CLINICAL DATA:  Possible abscess.   EXAM: CT ABDOMEN AND PELVIS WITH CONTRAST   TECHNIQUE: Multidetector CT imaging of the abdomen and pelvis was performed using the standard protocol following bolus administration of intravenous contrast.   CONTRAST:  142mL OMNIPAQUE IOHEXOL 300 MG/ML  SOLN   COMPARISON:  February 20, 2021.   FINDINGS: Lower chest: Small bilateral pleural effusions are noted with adjacent subsegmental atelectasis or infiltrates, right greater than left.   Hepatobiliary: Status post cholecystectomy. No biliary dilatation is noted. Hepatic steatosis.   Pancreas: Unremarkable. No pancreatic ductal dilatation or surrounding inflammatory changes.   Spleen: Normal in size without focal abnormality.   Adrenals/Urinary Tract: Adrenal glands are unremarkable. Kidneys are normal, without renal calculi, focal lesion, or hydronephrosis. Bladder is unremarkable.   Stomach/Bowel: Stable appearance of gastrostomy with stomach and balloon partially retracted into subcutaneous tissues. There is no evidence of bowel obstruction or inflammation. Postsurgical changes are noted in the right colon.   Vascular/Lymphatic: Aortic atherosclerosis. No enlarged abdominal or pelvic lymph nodes.   Reproductive: Status post hysterectomy. No adnexal masses.   Other: Stable small fat containing supraumbilical ventral hernia is noted. No ascites is noted. Grossly stable size and appearance of rounded abnormality seen to the left of the umbilicus currently measuring 2.5 x  1.5 cm. Potentially this may represent a small abscess as noted on prior exam.   Musculoskeletal: No acute or significant osseous findings.   IMPRESSION: Small bilateral pleural effusions are noted with adjacent subsegmental atelectasis or infiltrates, right greater than left.   Stable size and appearance of rounded abnormality seen to the left of the umbilicus in the subcutaneous  tissues in the anterior abdominal wall which may represent small abscess as noted on prior exam.   Stable small fat containing supraumbilical ventral hernia.   Hepatic steatosis.   Aortic Atherosclerosis (ICD10-I70.0).     Electronically Signed   By: Marijo Conception M.D.   On: 02/26/2021 15:50   Assessment/Plan:  81 y.o. female with respiratory failure on mechanical ventilation, complicated by pertinent comorbidities including chronic respiratory failure, diabetes, history of breast cancer, tracheostomy dependent.   Periumbilical seroma, remains noted on the above CT imaging, despite the general tenderness across the region, there is no focal inflammatory changes, erythema or skin changes suspicious, nor consistent with the potential for abscess in this area.  Patient with suspected recurrent aspiration pneumonia.  Imaging and physical exam are not concerning of misplacement or dislodgment of gastrostomy.  I do see any contraindication to continue feeding through the gastrostomy at this time, currently at 20 mL/h.   May consider Reglan if gastroparesis could be causing and delayed gastric emptying.  If patient does not tolerate feeding and continue with aspiration pneumonia then upper GI series with oral contrast to the gastrostomy can be helpful.  If gastroparesis is not the cause of her reflux and aspiration another option is to consider exchanging the gastrostomy tube to a gastrojejunostomy tube under fluoroscopic guidance.  Patient Active Problem List   Diagnosis Date Noted   Acute pulmonary edema ([place])    Somnolence    Sepsis ([place]) 02/20/2021   Abdominal wall abscess    Chronic respiratory failure with hypoxia ([place]) 03/29/2020   Chronic diastolic CHF (congestive heart failure) ([place]) 01/31/2020   Uncontrolled type 2 diabetes mellitus with hyperglycemia ([place]) 01/31/2020   AF (paroxysmal atrial fibrillation) ([place]) 01/31/2020   Aspiration pneumonia of both lower lobes due to gastric  secretions ([place]) 37/16/9678   Acute metabolic encephalopathy 93/81/0175   Acute respiratory failure ([place]) 01/23/2020   Left lower lobe pneumonia 01/16/2020   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   Drug-induced polyneuropathy ([place]) 05/05/2019   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[place]) 05/05/2019   Weakness    Hypoxia    Acute respiratory failure with hypoxia and hypercapnia ([place]) 03/21/2019   Hypotension 03/21/2019   Acute encephalopathy 03/21/2019   Multiple fractures of ribs, right side, initial encounter for closed fracture 12/25/2018   Colon polyp 11/06/2018   Esophageal dysphagia    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    Stricture and stenosis of esophagus    Elevated CEA 09/02/2018   Goals of care, counseling/discussion 09/02/2018   Polyp of transverse colon 09/02/2018   Foreign body in right foot 05/26/2018   History of tracheostomy 04/30/2018   Moderate episode of recurrent major depressive disorder ([place]) 04/03/2018   Poor balance 04/03/2018   Shoulder lesion, right 02/28/2017   Cervical radiculopathy 09/28/2016   Peripheral neuropathy 08/28/2016   Diabetic peripheral neuropathy ([place]) 08/28/2016   Leg weakness, bilateral 08/13/2016   Gait abnormality 08/13/2016   Carcinoma of overlapping sites of right breast in female, estrogen receptor negative ([place]) 12/27/2015   Malignant neoplasm of breast ([place]) 03/28/2015   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[place]) 03/28/2015  Hypercholesterolemia 03/28/2015   Recurrent major depressive disorder, in partial remission ([place]) 03/01/2015   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   Cephalalgia 09/15/2014   Headache due to trauma 09/15/2014   Osteopenia 01/07/2014   Age-related osteoporosis without current pathological fracture 01/07/2014   History of colonic polyps 12/14/2013   History of breast cancer 11/26/2012   H/O malignant neoplasm of breast 11/26/2012   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   Tracheostomy present  ([place]) 12/06/2011   Calcification of bronchus or trachea 01/16/2011   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   Anxiety state 12/19/2010   Blood in sputum 12/19/2010   HLD (hyperlipidemia) 12/19/2010   Type 2 diabetes mellitus ([place]) 12/19/2010   All of the above findings and recommendations were discussed with the patient, patient's family, and all of family's questions were answered to his expressed satisfaction.   -- Ronny Bacon M.D., [facility] 02/26/2021 2:11 PM

## 2021-02-26 NOTE — Progress Notes (Signed)
NAME:  Angela David, MRN:  [id number], DOB:  Aug 07, 1940, LOS: 5 ADMISSION DATE:  02/20/2021, CONSULTATION DATE:  02/20/2021 REFERRING MD: Imagene Sheller, MD. 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   ED Course: Temperature 101.5 F, BP 126/65, HR 111, RR 27, SPO2 87% room air.  CT abdomen/pelvis with potential small focus subcutaneous fluid collection left of the umbilicus measuring 2.4 x 1.5 x 1.2 cm concerning for possible abscess.  General surgery consulted for further evaluation.  Patient now with also O2 requirement with possible pneumonia but no infiltrates on x-ray.  PCCM was consulted for assistance with vent management.  Patient was started on broad-spectrum antibiotics and admitted to the hospitalist service  Pertinent  Medical History   Right Breast Cancer Tx'd with chemotherapy and XRT    Chronic respiratory failure ([place])    s/p tracheostomy in 2012; on ventilatory support at hs  Diabetic peripheral neuropathy ([place]) 08/28/2016  GERD (gastroesophageal reflux disease)    Glaucoma    High cholesterol    History of colon polyps    Osteoarthritis    Personal history of chemotherapy 2002  BREAST CA  Personal history of radiation therapy 2002  BREAST CA  S/P percutaneous endoscopic gastrostomy (PEG) tube placement ([place])    T2DM (type 2 diabetes mellitus) ([place])    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 PCCM consulted for vent management 12/26: General surgery consulted 12/27: Remains on full vent support. 12/28: Remains agitated on the vent. CT head and chest obtained 12/30: Patient with episode of respiratory distress due to suspected aspiration. Bolus feed held.STAT CXR performed which revealed a worsening R pleural effusion. 12/31: Remains on the vent.  Cultures:  01/01 BCx: pending 01/01 UCx: pending 12/28 BCx: NGTD 12/26 BCx: NG final 12/30 RCx: NG final 12/26 MRSA PCR: not detected  12/26 respiratory panel: negative 12/26 SARS CoV-2: negative 12/26 influenza A/B: negative 12/26: Strep pneumo urinary antigen>>negative 12/26: Legionella urinary antigen>>negative   Antimicrobials:  12/26 vancomycin >> 12/29    01/01 >> 12/30 metronidazole >> 12/26 cefepime >> Interim History / Subjective:  -No acute events overnight. -Remains febrile, cultures/test obtained and pending  OBJECTIVE   Blood pressure 132/76, pulse 100, temperature 98.8 F (37.1 C), temperature source Oral, resp. rate 20, height 5' 4.02" (1.626 m), weight 73.7 kg, SpO2 97 %.    Vent Mode: PSV FiO2 (%):  [40 %] 40 % Set Rate:  [14 bmp] 14 bmp PEEP:  [5 cmH20] 5 cmH20 Pressure Support:  [5 cmH20] 5 cmH20   Intake/Output Summary (Last 24 hours) at 02/26/2021 0957 Last data filed at 02/26/2021 0559 Gross per 24 hour  Intake 1672.05 ml  Output 1250 ml  Net 422.05 ml   Filed Weights   02/24/21 0416 02/25/21 0316 02/26/21 0402  Weight: 78.7 kg 73.4 kg 73.7  kg   Examination: GENERAL:age-year-old patient lying in the bed on the vent via trach EYES: Pupils equal, round, reactive to light and accommodation. No scleral icterus. Extraocular muscles intact.  HEENT: Head atraumatic, normocephalic. Oropharynx and nasopharynx clear.   NECK:  Supple, no jugular venous distention. No thyroid enlargement, no tenderness.  LUNGS: Normal breath sounds bilaterally, no wheezing, rales,rhonchi or crepitation. No use of accessory muscles of respiration.  CARDIOVASCULAR: S1, S2 normal. No murmurs, rubs, or gallops.  ABDOMEN: Soft, nontender, nondistended. Bowel sounds present. No organomegaly or mass. PEG tube in place EXTREMITIES: No pedal edema, cyanosis, or clubbing.  NEUROLOGIC: Cranial nerves II through XII are intact. Muscle strength 5/5 in all extremities. Sensation intact. Gait not checked.  PSYCHIATRIC: The patient is alert and oriented x 1. Nods appropriately SKIN: No obvious rash, lesion, or ulcer.   Labs/imaging that I havepersonally reviewed  (right click and "Reselect all SmartList Selections" daily)    Labs   CBC: Recent Labs  Lab 02/20/21 1137 02/21/21 0507 02/22/21 0413 02/23/21 0449 02/25/21 0407 02/26/21 0414  WBC 10.0 6.8 5.8 6.0 5.5 7.6  NEUTROABS 8.9*  --   --   --   --   --   HGB 12.5 11.1* 12.5 13.2 13.5 13.1  HCT 38.8 34.8* 38.2 42.0 43.5 43.0  MCV 84.7 84.7 85.1 85.0 86.7 88.8  PLT 130* 113* 115* 143* 171 505    Basic Metabolic Panel: Recent Labs  Lab 02/22/21 0413 02/23/21 0449 02/24/21 0403 02/25/21 0407 02/26/21 0414  NA 134* 133* 139 142 144  K 4.3 3.8 3.3* 4.3 3.4*  CL 102 98 101 101 102  CO2 _0 32 32  GLUCOSE 159* 303* 157* 231* 198*  BUN 13 25* 30* 28* 36*  CREATININE 0.71 0.88 0.87 0.85 1.14*  CALCIUM 8.8* 7.9* 7.9* 8.2* 8.5*  MG 1.9 1.9 1.8 2.4 2.0  PHOS 1.6* 1.9* 1.7* 3.0 1.6*   GFR: Estimated Creatinine Clearance: 40 mL/min (A) (by C-G formula based on SCr of 1.14 mg/dL (H)). Recent Labs  Lab 02/21/21 0507 02/21/21 0714 02/22/21 0413 02/22/21 1929 02/23/21 0449 02/24/21 0403 02/25/21 0407 02/26/21 0414  PROCALCITON 0.17  --  0.21  --  1.98 1.33 1.46  --   WBC 6.8  --  5.8  --  6.0  --  5.5 7.6  LATICACIDVEN 2.6* 3.2*  --  1.7 2.1*  --   --   --      Liver Function Tests: Recent Labs  Lab 02/20/21 1137 02/26/21 0414  AST 59* 30  ALT 41 26  ALKPHOS 69 77  BILITOT 0.9 0.8  PROT 7.7 6.8  ALBUMIN 3.9 2.7*   No results for input(s): LIPASE, AMYLASE in the last 168 hours. No results for input(s): AMMONIA in the last 168 hours.  ABG    Component Value Date/Time   PHART 7.44 02/03/2020 1055   PCO2ART 53 (H) 02/03/2020 1055   PO2ART 92 02/03/2020 1055   HCO3 36.0 (H) 02/03/2020 1055   ACIDBASEDEF 0.9 01/19/2020 1024   O2SAT 97.4 02/03/2020 1055     Coagulation Profile: Recent Labs  Lab 02/20/21 1423  INR 1.1    Cardiac Enzymes: No results for input(s): CKTOTAL, CKMB, CKMBINDEX, TROPONINI in the last 168 hours.  HbA1C: Hgb A1c MFr Bld  Date/Time Value Ref Range Status  01/20/2020 07:19 AM 6.3 (H) 4.8 - 5.6 % Final    Comment:    (NOTE) Pre diabetes:  5.7%-6.4%  Diabetes:              >6.4%  Glycemic control for   <7.0% adults with diabetes   03/21/2019 03:24 AM 6.6 (H) 4.8 - 5.6 % Final    Comment:    (NOTE) Pre diabetes:          5.7%-6.4% Diabetes:              >6.4% Glycemic control for   <7.0% adults with diabetes     CBG: Recent Labs  Lab 02/25/21 1529 02/25/21 1914 02/25/21 2258 02/26/21 0348 02/26/21 0714  GLUCAP 178* 156* 204* 165* 197*    Allergies Allergies  Allergen Reactions   Shellfish Allergy Shortness Of Breath    respitatory distress    Sodium Sulfite Shortness Of Breath    Severe Congestion that creates inability to breath   Sulfa Antibiotics Shortness Of Breath and Other (See Comments)    respitatory distress   Codeine Other (See Comments)    Per patient "it kept her up all night"   Glucerna [Alimentum] Itching   Wound Dressing Adhesive Rash     Home Medications  Prior to Admission medications   Medication Sig Start Date End Date Taking? Authorizing Provider  acetaminophen (TYLENOL) 500 MG tablet Take 1,000 mg by mouth in the morning and at bedtime.    Yes [provider]  busPIRone (BUSPAR) 15 MG tablet Place 15 mg into feeding tube 2 (two) times daily. 09/26/20  Yes [provider]  carboxymethylcellulose (REFRESH PLUS) 0.5 % SOLN Place 1 drop into both eyes 3 (three) times daily as needed (dry eyes).   Yes [provider]  denosumab (PROLIA) 60 MG/ML SOSY injection Inject 60 mg into the skin every 6 (six) months.   Yes [provider]  EPINEPHrine 0.3 mg/0.3 mL IJ SOAJ injection epinephrine 0.3 mg/0.3 mL injection, auto-injector  Inject 0.3 mLs (0.3 mg dose) into the muscle once as needed for Anaphylaxis for up to 1 dose. 09/10/18  Yes [provider]  escitalopram (LEXAPRO) 10 MG tablet Place 3 tablets (30 mg total) into feeding tube daily. Patient taking differently: Place 10 mg into feeding tube daily. 02/13/20  Yes Kasa, Maretta Bees, MD  escitalopram (LEXAPRO) 20 MG tablet Place 20 mg into feeding tube daily.   Yes [provider]  folic acid (FOLVITE) 1 MG tablet Place 1 tablet (1 mg total) into feeding tube daily. 02/13/20  Yes Flora Lipps, MD  gabapentin (NEURONTIN) 600 MG tablet Place 600 mg into feeding tube 3 (three) times daily.   Yes [provider]  lovastatin (MEVACOR) 20 MG tablet Place 20 mg into feeding tube daily. 06/13/20  Yes [provider]  meclizine (ANTIVERT) 25 MG tablet Take 25 mg by mouth daily. 02/17/21  Yes [provider]  melatonin 5 MG TABS Place 1 tablet (5 mg total) into feeding tube at bedtime. 02/13/20  Yes Flora Lipps, MD  metFORMIN (GLUCOPHAGE) 1000 MG tablet Place 1,000 mg into feeding tube daily with breakfast. 06/10/20  Yes [provider]  metoprolol tartrate (LOPRESSOR) 25 MG tablet Place 25 mg into feeding tube daily. 06/13/20  Yes [provider]  Multiple Vitamins-Minerals (HAIR/SKIN/NAILS) TABS Take 1 tablet by mouth daily.   Yes [provider]  [facility] powder Apply topically. 01/18/21  Yes [provider]  pantoprazole (PROTONIX) 40 MG tablet Take 40 mg by mouth daily.   Yes [provider]  rOPINIRole (REQUIP) 0.5 MG tablet Place 1  tablet (0.5 mg total) into feeding tube at bedtime. 02/13/20  Yes Kasa, Maretta Bees, MD  sodium chloride 0.9 % nebulizer solution sodium chloride 0.9 % for nebulization  USE 5 ML BY NEBULIZTION AS NEEDED FOR WHEEZING 07/17/19  Yes [provider]  Scheduled Meds:  busPIRone  15 mg Per Tube BID   chlorhexidine gluconate (MEDLINE KIT)  15 mL Mouth Rinse BID   Chlorhexidine Gluconate Cloth  6 each Topical Q0600   escitalopram  30 mg Per Tube Daily   feeding supplement (KATE FARMS STANDARD 1.4)  325 mL Per Tube QID   fentaNYL (SUBLIMAZE) injection  100 mcg Intravenous Once   folic acid  1 mg Per Tube Daily   free water  30 mL Per Tube Q4H   heparin  5,000 Units Subcutaneous Q8H   insulin aspart  0-15 Units Subcutaneous Q4H   insulin detemir  8 Units Subcutaneous BID   ipratropium-albuterol  3 mL Nebulization Q6H   loratadine  10 mg Oral Daily   mouth rinse  15 mL Mouth Rinse 10 times per day   melatonin  5 mg Oral QHS   metoprolol tartrate  25 mg Per Tube Daily   pantoprazole sodium  40 mg Per Tube Daily   pravastatin  20 mg Per Tube q1800   rOPINIRole  0.5 mg Per Tube QHS   Continuous Infusions:  ceFEPime (MAXIPIME) IV Stopped (02/26/21 5056)   metronidazole Stopped (02/25/21 2105)   potassium PHOSPHATE IVPB (in mmol) 64.4 mL/hr at 02/26/21 0559   [START ON 02/27/2021] vancomycin     vancomycin     PRN Meds:.acetaminophen, acetaminophen, diazepam, docusate, ipratropium-albuterol, metoprolol tartrate   ASSESSMENT & PLAN  Acute on Chronic Hypoxic Respiratory Failure secondary to suspected aspiration in the setting of 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 -continue ventilator support & lung protective strategies -Wean PEEP & FiO2 as tolerated, maintain SpO2 > 90% -Head of bed elevated 30  degrees -Intermittent chest x-ray & ABG PRN -Ensure adequate pulmonary hygiene  -bronchodilators PRN -Lopressor  PRN from 2.5 to 5 mg Q 6 PRN -Xanax TID added back as PRN for anxiety  Sepsis due to suspected Abdominal Abscess & recurrent Aspiration Pneumonia Met sepsis criteria on admission Follow up Chest CT with right pleural effusion and bilateral lower lobe consolidation concerning for aspiration pneumonia -F/u cultures, trend lactic/ PCT -Monitor WBC/ fever curve -IV antibiotics: cefepime & vancomycin&Flagyl -IVF hydration as needed -Pressors for MAP goal >65 -Strict I/O's   Possible subcutaneous periumbilical abscess -CT Abdomen/Pelvis concerning for possible abscess  -Per surgery restart continuous TF at low rate,if aspiration continues will obtain GI series with oral contrast to the gastrostomy can be helpful.  If gastroparesis is not the cause of her reflux and aspiration another option is to consider exchanging the gastrostomy tube to a gastrojejunostomy tube under fluoroscopic guidance. -Consider Reglan for GI motility -General surgery consult appreciated    Diabetes mellitus      -CBG's -SSI and Lantus -Follow ICU Hypo/hyperglycemia protocol    Nutritional Status/Diet -Will start trickle TF per PEG. General Surgery ok to use -Dietician following, TF's as tolerated -Constipation protocol as indicated    Electrolytes -follow labs as needed -replace as needed -pharmacy consultation and following    Best practice (right click and "Reselect all SmartList Selections" daily)  Diet:  Tube Feed  Pain/Anxiety/Delirium protocol (if indicated): Yes (RASS goal 0) VAP protocol (if indicated): Yes DVT prophylaxis: Subcutaneous Heparin GI prophylaxis: PPI Glucose control:  SSI Yes Central venous access:  N/A Arterial line:  N/A Foley:  Yes, and it is still needed Mobility:  bed rest  PT consulted: N/A Last date of multidisciplinary goals of care discussion  [02/26/21] Code Status:  full code Disposition: ICU  Critical care time: [place], DNP, FNP-C, AGACNP-BC Acute Care Nurse Practitioner  [facility] Pager: [phone] [place] [facility]

## 2021-02-26 NOTE — Plan of Care (Signed)
Continuing with plan of care. 

## 2021-02-26 NOTE — Progress Notes (Signed)
Patient's day was eventful.  Patient had a fever at the beginning of the shift and Dr. Priscella Mann notified and ordered Vancomycin, labs, MRSA swab, urine u/a and cx, CXR, and CT scan.  Patient was transported to CT in bed with RT and this nurse and tolerated CT well, patient was in stable condition throughout trip.  Later in the shift patient was noted to have no urine output, upon bladder scan volume noted to be 739mL, foley placed and patient tolerated well, Dr. Priscella Mann placed orders.  Urine sample sent to lab.  Will continue to monitor patient.

## 2021-02-27 LAB — GLUCOSE, CAPILLARY
Glucose-Capillary: 117 mg/dL — ABNORMAL HIGH (ref 70–99)
Glucose-Capillary: 157 mg/dL — ABNORMAL HIGH (ref 70–99)
Glucose-Capillary: 159 mg/dL — ABNORMAL HIGH (ref 70–99)
Glucose-Capillary: 162 mg/dL — ABNORMAL HIGH (ref 70–99)
Glucose-Capillary: 165 mg/dL — ABNORMAL HIGH (ref 70–99)
Glucose-Capillary: 165 mg/dL — ABNORMAL HIGH (ref 70–99)

## 2021-02-27 LAB — CBC
HCT: 35 % — ABNORMAL LOW (ref 36.0–46.0)
Hemoglobin: 11 g/dL — ABNORMAL LOW (ref 12.0–15.0)
MCH: 26.9 pg (ref 26.0–34.0)
MCHC: 31.4 g/dL (ref 30.0–36.0)
MCV: 85.6 fL (ref 80.0–100.0)
Platelets: 166 10*3/uL (ref 150–400)
RBC: 4.09 MIL/uL (ref 3.87–5.11)
RDW: 14.1 % (ref 11.5–15.5)
WBC: 8.4 10*3/uL (ref 4.0–10.5)
nRBC: 0 % (ref 0.0–0.2)

## 2021-02-27 LAB — COMPREHENSIVE METABOLIC PANEL
ALT: 20 U/L (ref 0–44)
AST: 27 U/L (ref 15–41)
Albumin: 2.7 g/dL — ABNORMAL LOW (ref 3.5–5.0)
Alkaline Phosphatase: 68 U/L (ref 38–126)
Anion gap: 11 (ref 5–15)
BUN: 32 mg/dL — ABNORMAL HIGH (ref 8–23)
CO2: 29 mmol/L (ref 22–32)
Calcium: 8.4 mg/dL — ABNORMAL LOW (ref 8.9–10.3)
Chloride: 106 mmol/L (ref 98–111)
Creatinine, Ser: 0.93 mg/dL (ref 0.44–1.00)
GFR, Estimated: >60 mL/min (ref >60)
Glucose, Bld: 193 mg/dL — ABNORMAL HIGH (ref 70–99)
Potassium: 3.6 mmol/L (ref 3.5–5.1)
Sodium: 146 mmol/L — ABNORMAL HIGH (ref 135–145)
Total Bilirubin: 0.8 mg/dL (ref 0.3–1.2)
Total Protein: 6.4 g/dL — ABNORMAL LOW (ref 6.5–8.1)

## 2021-02-27 LAB — PHOSPHORUS: Phosphorus: 2.4 mg/dL — ABNORMAL LOW (ref 2.5–4.6)

## 2021-02-27 LAB — MAGNESIUM: Magnesium: 1.9 mg/dL (ref 1.7–2.4)

## 2021-02-27 MED ORDER — DIPHENHYDRAMINE HCL 50 MG/ML IJ SOLN
12.5000 mg | Freq: Once | INTRAMUSCULAR | Status: AC
  Administered 2021-02-27: 12.5 mg via INTRAVENOUS
  Filled 2021-02-27: qty 1

## 2021-02-27 MED ORDER — SODIUM CHLORIDE 0.9 % IV SOLN: INTRAVENOUS | Status: DC

## 2021-02-27 MED ORDER — MINOCYCLINE HCL 50 MG PO CAPS
200.0000 mg | ORAL_CAPSULE | Freq: Two times a day (BID) | ORAL | Status: DC
  Administered 2021-02-27 – 2021-03-02 (×7): 200 mg
  Filled 2021-02-27 (×10): qty 4

## 2021-02-27 MED ORDER — ADULT MULTIVITAMIN LIQUID CH
15.0000 mL | Freq: Every day | ORAL | Status: DC
  Administered 2021-02-28 – 2021-03-06 (×7): 15 mL
  Filled 2021-02-27 (×8): qty 15

## 2021-02-27 MED ORDER — KATE FARMS STANDARD 1.4 PO LIQD
480.0000 mL | Freq: Every day | ORAL | Status: DC
  Administered 2021-02-28: 480 mL
  Filled 2021-02-27: qty 650

## 2021-02-27 MED ORDER — FUROSEMIDE 10 MG/ML IJ SOLN
40.0000 mg | Freq: Two times a day (BID) | INTRAMUSCULAR | Status: DC
  Administered 2021-02-27 – 2021-03-03 (×7): 40 mg via INTRAVENOUS
  Filled 2021-02-27 (×8): qty 4

## 2021-02-27 MED ORDER — ALBUMIN HUMAN 25 % IV SOLN
12.5000 g | Freq: Every day | INTRAVENOUS | Status: DC
  Administered 2021-02-27 – 2021-03-03 (×5): 12.5 g via INTRAVENOUS
  Filled 2021-02-27 (×4): qty 50

## 2021-02-27 MED ORDER — FREE WATER
50.0000 mL | Status: DC
  Administered 2021-02-27 – 2021-03-06 (×43): 50 mL

## 2021-02-27 MED ORDER — POTASSIUM CHLORIDE 10 MEQ/100ML IV SOLN
10.0000 meq | INTRAVENOUS | Status: AC
  Administered 2021-02-27 (×5): 10 meq via INTRAVENOUS
  Filled 2021-02-27 (×5): qty 100

## 2021-02-27 MED ORDER — LORATADINE 10 MG PO TABS
10.0000 mg | ORAL_TABLET | Freq: Every day | ORAL | Status: DC
  Administered 2021-02-27 – 2021-03-06 (×8): 10 mg
  Filled 2021-02-27 (×8): qty 1

## 2021-02-27 MED ORDER — LACTATED RINGERS IV SOLN: INTRAVENOUS | Status: DC

## 2021-02-27 MED ORDER — ACETAMINOPHEN 325 MG PO TABS
650.0000 mg | ORAL_TABLET | Freq: Four times a day (QID) | ORAL | Status: DC | PRN
  Administered 2021-03-01 – 2021-03-04 (×3): 650 mg
  Filled 2021-02-27 (×3): qty 2

## 2021-02-27 MED ORDER — MELATONIN 5 MG PO TABS
5.0000 mg | ORAL_TABLET | Freq: Every day | ORAL | Status: DC
  Administered 2021-02-27 – 2021-03-05 (×7): 5 mg
  Filled 2021-02-27 (×7): qty 1

## 2021-02-27 MED ORDER — DIAZEPAM 5 MG PO TABS
5.0000 mg | ORAL_TABLET | Freq: Three times a day (TID) | ORAL | Status: DC | PRN
  Administered 2021-02-28: 5 mg
  Filled 2021-02-27: qty 1

## 2021-02-27 MED ORDER — MAGNESIUM SULFATE 2 GM/50ML IV SOLN
2.0000 g | Freq: Once | INTRAVENOUS | Status: AC
  Administered 2021-02-27: 2 g via INTRAVENOUS
  Filled 2021-02-27: qty 50

## 2021-02-27 NOTE — Progress Notes (Signed)
Subjective:  CC: Angela David is a 81 y.o. female  Hospital stay day 7,   aspiration PNA, respiratory failure  HPI: No acute issue reported overnight.  Vented and sedated. No report of additional aspiration concerns.  ROS:  Unable to obtain secondary to patient status   Objective:   Temp:  [98.1 F (36.7 C)-100.5 F (38.1 C)] 99.6 F (37.6 C) (01/02 0400) Pulse Rate:  [95-113] 109 (01/02 0700) Resp:  [17-26] 26 (01/02 0700) BP: (102-147)/(32-128) 145/62 (01/02 0700) SpO2:  [97 %-100 %] 98 % (01/02 0700) FiO2 (%):  [40 %] 40 % (01/02 0155) Weight:  [75.2 kg] 75.2 kg (01/02 0451)     Height: 5' 4.02" (162.6 cm) Weight: 75.2 kg BMI (Calculated): 28.44   Intake/Output this shift:   Intake/Output Summary (Last 24 hours) at 02/27/2021 8676 Last data filed at 02/27/2021 0600 Gross per 24 hour  Intake 2404.23 ml  Output 1200 ml  Net 1204.23 ml    Constitutional :  alert, cooperative, appears stated age, and no distress  Respiratory:  clear to auscultation bilaterally  Cardiovascular:  regular rate and rhythm  Gastrointestinal: soft, non-tender; bowel sounds normal; no masses,  no organomegaly.   Skin: Cool and moist. No overlying skin changes around gtube or periumbilical area for concerns of active infection  Psychiatric: Normal affect, non-agitated, not confused       LABS:  CMP Latest Ref Rng & Units 02/27/2021 02/26/2021 02/26/2021  Glucose 70 - 99 mg/dL 193(H) - 198(H)  BUN 8 - 23 mg/dL 32(H) - 36(H)  Creatinine 0.44 - 1.00 mg/dL 0.93 - 1.14(H)  Sodium 135 - 145 mmol/L 146(H) - 144  Potassium 3.5 - 5.1 mmol/L 3.6 3.3(L) 3.4(L)  Chloride 98 - 111 mmol/L 106 - 102  CO2 22 - 32 mmol/L 29 - 32  Calcium 8.9 - 10.3 mg/dL 8.4(L) - 8.5(L)  Total Protein 6.5 - 8.1 g/dL 6.4(L) - 6.8  Total Bilirubin 0.3 - 1.2 mg/dL 0.8 - 0.8  Alkaline Phos 38 - 126 U/L 68 - 77  AST 15 - 41 U/L 27 - 30  ALT 0 - 44 U/L 20 - 26   CBC Latest Ref Rng & Units 02/27/2021 02/26/2021 02/25/2021  WBC 4.0  - 10.5 K/uL 8.4 7.6 5.5  Hemoglobin 12.0 - 15.0 g/dL 11.0(L) 13.1 13.5  Hematocrit 36.0 - 46.0 % 35.0(L) 43.0 43.5  Platelets 150 - 400 K/uL 166 180 171    RADS: CLINICAL DATA:  Possible abscess.   EXAM: CT ABDOMEN AND PELVIS WITH CONTRAST   TECHNIQUE: Multidetector CT imaging of the abdomen and pelvis was performed using the standard protocol following bolus administration of intravenous contrast.   CONTRAST:  14mL OMNIPAQUE IOHEXOL 300 MG/ML  SOLN   COMPARISON:  February 20, 2021.   FINDINGS: Lower chest: Small bilateral pleural effusions are noted with adjacent subsegmental atelectasis or infiltrates, right greater than left.   Hepatobiliary: Status post cholecystectomy. No biliary dilatation is noted. Hepatic steatosis.   Pancreas: Unremarkable. No pancreatic ductal dilatation or surrounding inflammatory changes.   Spleen: Normal in size without focal abnormality.   Adrenals/Urinary Tract: Adrenal glands are unremarkable. Kidneys are normal, without renal calculi, focal lesion, or hydronephrosis. Bladder is unremarkable.   Stomach/Bowel: Stable appearance of gastrostomy with stomach and balloon partially retracted into subcutaneous tissues. There is no evidence of bowel obstruction or inflammation. Postsurgical changes are noted in the right colon.   Vascular/Lymphatic: Aortic atherosclerosis. No enlarged abdominal or pelvic lymph nodes.   Reproductive:  Status post hysterectomy. No adnexal masses.   Other: Stable small fat containing supraumbilical ventral hernia is noted. No ascites is noted. Grossly stable size and appearance of rounded abnormality seen to the left of the umbilicus currently measuring 2.5 x 1.5 cm. Potentially this may represent a small abscess as noted on prior exam.   Musculoskeletal: No acute or significant osseous findings.   IMPRESSION: Small bilateral pleural effusions are noted with adjacent subsegmental atelectasis or  infiltrates, right greater than left.   Stable size and appearance of rounded abnormality seen to the left of the umbilicus in the subcutaneous tissues in the anterior abdominal wall which may represent small abscess as noted on prior exam.   Stable small fat containing supraumbilical ventral hernia.   Hepatic steatosis.   Aortic Atherosclerosis (ICD10-I70.0).     Electronically Signed   By: Marijo Conception M.D.   On: 02/26/2021 15:50 Assessment:   Aspiration pneumonia, gtube concerns, abdominal wall seroma.  Images above reviewed personally and agree with report except for fluid collection likely being seroma rather than abscess, since clinical exam is not consistent with active infection in the area.  Gtube seems to be in proper position as well.  Report is pt tolerating rate of 37ml/hr. Also had small BM. Recommend continuing this rate for now until patient is able to wean off vent.  If signs of persistent regurgitation, agree with previous recs of further studies such as upper GI or switching to gastroJ.  Further care per ICU team.

## 2021-02-27 NOTE — Progress Notes (Incomplete)
Pharmacy Antibiotic Note  Angela David is a 81 y.o. female admitted on 02/20/2021.  Pharmacy has been consulted for cefepime and vancomycin dosing for abscess. Pt was recently placed on Augmentin for possible cellulitis as an outpatient. She is spiking fevers with ongoing antibiotics and new cultures/tests have been ordered  Plan:  1) continue cefepime  2 grams IV every 12 hours  2) start vancomycin 1500 mg IV x 1 then 1000 mg IV every 24 hours Goal AUC 400-550 Expected AUC: 561.7 SCr used: 1.14 mg/dL Ke: 0.034 h-1, T1/2: 20.7 h  Will continue to monitor renal function and adjust dose as clinically indicated  Height: 5' 4.02" (162.6 cm) Weight: 75.2 kg (165 lb 12.6 oz) IBW/kg (Calculated) : 54.74  Temp (24hrs), Avg:99.6 F (37.6 C), Min:98.1 F (36.7 C), Max:101.7 F (38.7 C)  Recent Labs  Lab 02/20/21 1137 02/21/21 0507 02/21/21 0714 02/22/21 0413 02/22/21 1929 02/23/21 0449 02/24/21 0403 02/25/21 0407 02/26/21 0414 02/27/21 0456  WBC 10.0 6.8  --  5.8  --  6.0  --  5.5 7.6 8.4  CREATININE 0.94 0.84  --  0.71  --  0.88 0.87 0.85 1.14* 0.93  LATICACIDVEN 2.5* 2.6* 3.2*  --  1.7 2.1*  --   --   --   --      Estimated Creatinine Clearance: 49.5 mL/min (by C-G formula based on SCr of 0.93 mg/dL).    Allergies  Allergen Reactions   Shellfish Allergy Shortness Of Breath    respitatory distress    Sodium Sulfite Shortness Of Breath    Severe Congestion that creates inability to breath   Sulfa Antibiotics Shortness Of Breath and Other (See Comments)    respitatory distress   Codeine Other (See Comments)    Per patient "it kept her up all night"   Glucerna [Alimentum] Itching   Wound Dressing Adhesive Rash    Antimicrobials this admission: 12/26 vancomycin >> 12/29    01/01 >> 12/30 metronidazole >> 12/26 cefepime >>  Microbiology results: 01/01 BCx: pending 01/01 UCx: pending 12/28 BCx: NGTD 12/26 BCx: NG final 12/30 RCx: NG final 12/26 MRSA PCR:  not detected  12/26 respiratory panel: negative 12/26 SARS CoV-2: negative 12/26 influenza A/B: negative  Thank you for allowing pharmacy to be a part of this patients care.  Narda Rutherford, PharmD 02/27/2021 7:38 AM

## 2021-02-27 NOTE — Care Management (Signed)
Discussed case with PCCM.  Patient now vent dependent.  PCCM service to assume primary care.  Greatly appreciate assistance.  [facility] hospitalist service to sign off.  Please reconsult when patient stable for transfer back to our service  Ralene Muskrat MD

## 2021-02-27 NOTE — Progress Notes (Signed)
NAME:  Angela David, MRN:  [id number], DOB:  [birth year], LOS: 5 ADMISSION DATE:  02/20/2021, CONSULTATION DATE:  02/20/2021 REFERRING MD: Imagene Sheller, MD. 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   ED Course: Temperature 101.5 F, BP 126/65, HR 111, RR 27, SPO2 87% room air.  CT abdomen/pelvis with potential small focus subcutaneous fluid collection left of the umbilicus measuring 2.4 x 1.5 x 1.2 cm concerning for possible abscess.  General surgery consulted for further evaluation.  Patient now with also O2 requirement with possible pneumonia but no infiltrates on x-ray.  PCCM was consulted for assistance with vent management.  Patient was started on broad-spectrum antibiotics and admitted to the hospitalist service   02/27/21- patient remains on ventilator. S/p sugery evaluation of PEG tube. Resp cultures are stenotrophomonas.  She has pneumonia with parapneumonic effusion concerning for possible empyema. [name] at bedside reveals no pleural effusion bilaterally  Pertinent  Medical History   Right Breast Cancer Tx'd with chemotherapy and XRT    Chronic respiratory failure ([place])    s/p tracheostomy in 2012; on ventilatory support at hs  Diabetic peripheral neuropathy ([place]) 08/28/2016  GERD (gastroesophageal reflux disease)    Glaucoma    High cholesterol    History of colon polyps    Osteoarthritis    Personal history of chemotherapy 2002  BREAST CA  Personal history of radiation therapy 2002  BREAST CA   S/P percutaneous endoscopic gastrostomy (PEG) tube placement ([place])    T2DM (type 2 diabetes mellitus) ([place])    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 PCCM consulted for vent management 12/26: General surgery consulted 12/27: Remains on full vent support. 12/28: Remains agitated on the vent. CT head and chest obtained 12/30: Patient with episode of respiratory distress due to suspected aspiration. Bolus feed held.STAT CXR performed which revealed a worsening R pleural effusion. 12/31: Remains on the vent.  Cultures:  01/01 BCx: pending 01/01 UCx: pending 12/28 BCx: NGTD 12/26 BCx: NG final 12/30 RCx: NG final 12/26 MRSA PCR: not detected  12/26 respiratory panel: negative 12/26 SARS CoV-2: negative 12/26 influenza A/B: negative 12/26: Strep pneumo urinary antigen>>negative 12/26: Legionella urinary antigen>>negative   Antimicrobials:  12/26 vancomycin >> 12/29    01/01 >> 12/30 metronidazole >> 12/26 cefepime >> Interim History / Subjective:  -No acute events overnight. -Remains febrile, cultures/test obtained and pending  OBJECTIVE   Blood pressure (!) 145/62, pulse (!) 109, temperature 99.6 F (37.6 C), temperature source Axillary, resp. rate (!) 26, height 5' 4.02" (1.626 m), weight 75.2 kg, SpO2 98 %.    Vent Mode: SIMV/PC/PS FiO2 (%):  [40 %] 40 % Set Rate:  [14 bmp] 14 bmp PEEP:  [5 cmH20] 5 cmH20 Pressure Support:  [5 cmH20] 5 cmH20   Intake/Output Summary (Last 24 hours) at 02/27/2021 0901 Last  data filed at 02/27/2021 0600 Gross per 24 hour  Intake 2404.23 ml  Output 1200 ml  Net 1204.23 ml    Filed Weights   02/25/21 0316 02/26/21 0402 02/27/21 0451  Weight: 73.4 kg 73.7 kg 75.2 kg    Examination: GENERAL:age-year-old patient lying in the bed on the vent via trach EYES: Pupils equal, round, reactive to light and accommodation. No scleral icterus. Extraocular muscles intact.  HEENT: Head atraumatic, normocephalic. Oropharynx and nasopharynx clear.  NECK:  Supple, no jugular venous distention. No thyroid enlargement, no tenderness.  LUNGS: Normal breath sounds bilaterally, no wheezing, rales,rhonchi or crepitation. No use of accessory muscles of respiration.  CARDIOVASCULAR: S1, S2 normal. No murmurs, rubs, or gallops.  ABDOMEN: Soft, nontender, nondistended. Bowel sounds present. No organomegaly or mass. PEG tube in place EXTREMITIES: No pedal edema, cyanosis, or clubbing.  NEUROLOGIC: Cranial nerves II through XII are intact. Muscle strength 5/5 in all extremities. Sensation intact. Gait not checked.  PSYCHIATRIC: The patient is alert and oriented x 1. Nods appropriately SKIN: No obvious rash, lesion, or ulcer.   Labs/imaging that I havepersonally reviewed  (right click and "Reselect all SmartList Selections" daily)    Labs   CBC: Recent Labs  Lab 02/20/21 1137 02/21/21 0507 02/22/21 0413 02/23/21 0449 02/25/21 0407 02/26/21 0414 02/27/21 0456  WBC 10.0   < > 5.8 6.0 5.5 7.6 8.4  NEUTROABS 8.9*  --   --   --   --   --   --   HGB 12.5   < > 12.5 13.2 13.5 13.1 11.0*  HCT 38.8   < > 38.2 42.0 43.5 43.0 35.0*  MCV 84.7   < > 85.1 85.0 86.7 88.8 85.6  PLT 130*   < > 115* 143* 171 180 166   < > = values in this interval not displayed.     Basic Metabolic Panel: Recent Labs  Lab 02/23/21 0449 02/24/21 0403 02/25/21 0407 02/26/21 0414 02/26/21 2016 02/26/21 2225 02/27/21 0456  NA 133* 139 142 144  --   --  146*  K 3.8 3.3* 4.3 3.4*  --  3.3* 3.6  CL 98 101 101 102  --   --  106  CO2 24 29 32 32  --   --  29  GLUCOSE 303* 157* 231* 198*  --   --  193*  BUN 25* 30* 28* 36*  --   --  32*  CREATININE 0.88 0.87 0.85 1.14*  --   --  0.93  CALCIUM 7.9*  7.9* 8.2* 8.5*  --   --  8.4*  MG 1.9 1.8 2.4 2.0  --   --  1.9  PHOS 1.9* 1.7* 3.0 1.6* 2.3*  --  2.4*    GFR: Estimated Creatinine Clearance: 49.5 mL/min (by C-G formula based on SCr of 0.93 mg/dL). Recent Labs  Lab 02/21/21 0507 02/21/21 0714 02/22/21 0413 02/22/21 1929 02/23/21 0449 02/24/21 0403 02/25/21 0407 02/26/21 0414 02/27/21 0456  PROCALCITON 0.17  --  0.21  --  1.98 1.33 1.46  --   --   WBC 6.8  --  5.8  --  6.0  --  5.5 7.6 8.4  LATICACIDVEN 2.6* 3.2*  --  1.7 2.1*  --   --   --   --      Liver Function Tests: Recent Labs  Lab 02/20/21 1137 02/26/21 0414 02/27/21 0456  AST 59* 30 27  ALT 41 26 20  ALKPHOS 69 77 68  BILITOT  0.9 0.8 0.8  PROT 7.7 6.8 6.4*  ALBUMIN 3.9 2.7* 2.7*    No results for input(s): LIPASE, AMYLASE in the last 168 hours. No results for input(s): AMMONIA in the last 168 hours.  ABG    Component Value Date/Time   PHART 7.44 02/03/2020 1055   PCO2ART 53 (H) 02/03/2020 1055   PO2ART 92 02/03/2020 1055   HCO3 36.0 (H) 02/03/2020 1055   ACIDBASEDEF 0.9 01/19/2020 1024   O2SAT 97.4 02/03/2020 1055      Coagulation Profile: Recent Labs  Lab 02/20/21 1423  INR 1.1     Cardiac Enzymes: No results for input(s): CKTOTAL, CKMB, CKMBINDEX, TROPONINI in the last 168 hours.  HbA1C: Hgb A1c MFr Bld  Date/Time Value Ref Range Status  01/20/2020 07:19 AM 6.3 (H) 4.8 - 5.6 % Final    Comment:    (NOTE) Pre diabetes:          5.7%-6.4%  Diabetes:              >6.4%  Glycemic control for   <7.0% adults with diabetes   03/21/2019 03:24 AM 6.6 (H) 4.8 - 5.6 % Final    Comment:    (NOTE) Pre diabetes:          5.7%-6.4% Diabetes:              >6.4% Glycemic control for   <7.0% adults with diabetes     CBG: Recent Labs  Lab 02/26/21 1557 02/26/21 1856 02/26/21 2344 02/27/21 0358 02/27/21 0732  GLUCAP 145* 134* 115* 159* 165*     Allergies Allergies  Allergen Reactions   Shellfish Allergy Shortness Of Breath     respitatory distress    Sodium Sulfite Shortness Of Breath    Severe Congestion that creates inability to breath   Sulfa Antibiotics Shortness Of Breath and Other (See Comments)    respitatory distress   Codeine Other (See Comments)    Per patient "it kept her up all night"   Glucerna [Alimentum] Itching   Wound Dressing Adhesive Rash      Home Medications  Prior to Admission medications   Medication Sig Start Date End Date Taking? Authorizing Provider  acetaminophen (TYLENOL) 500 MG tablet Take 1,000 mg by mouth in the morning and at bedtime.   Yes [provider]  busPIRone (BUSPAR) 15 MG tablet Place 15 mg into feeding tube 2 (two) times daily. 09/26/20  Yes [provider]  carboxymethylcellulose (REFRESH PLUS) 0.5 % SOLN Place 1 drop into both eyes 3 (three) times daily as needed (dry eyes).   Yes [provider]  denosumab (PROLIA) 60 MG/ML SOSY injection Inject 60 mg into the skin every 6 (six) months.   Yes [provider]  EPINEPHrine 0.3 mg/0.3 mL IJ SOAJ injection epinephrine 0.3 mg/0.3 mL injection, auto-injector  Inject 0.3 mLs (0.3 mg dose) into the muscle once as needed for Anaphylaxis for up to 1 dose. 09/10/18  Yes [provider]  escitalopram (LEXAPRO) 10 MG tablet Place 3 tablets (30 mg total) into feeding tube daily. Patient taking differently: Place 10 mg into feeding tube daily. 02/13/20  Yes Kasa, Maretta Bees, MD  escitalopram (LEXAPRO) 20 MG tablet Place 20 mg into feeding tube daily.   Yes [provider]  folic acid (FOLVITE) 1 MG tablet Place 1 tablet (1 mg total) into feeding tube daily. 02/13/20  Yes Flora Lipps, MD  gabapentin (NEURONTIN) 600 MG tablet Place 600 mg into feeding tube 3 (three) times  daily.   Yes [provider]  lovastatin (MEVACOR) 20 MG tablet Place 20 mg into feeding tube daily. 06/13/20  Yes [provider]  meclizine (ANTIVERT) 25 MG tablet Take 25 mg by mouth daily.  02/17/21  Yes [provider]  melatonin 5 MG TABS Place 1 tablet (5 mg total) into feeding tube at bedtime. 02/13/20  Yes Flora Lipps, MD  metFORMIN (GLUCOPHAGE) 1000 MG tablet Place 1,000 mg into feeding tube daily with breakfast. 06/10/20  Yes [provider]  metoprolol tartrate (LOPRESSOR) 25 MG tablet Place 25 mg into feeding tube daily. 06/13/20  Yes [provider]  Multiple Vitamins-Minerals (HAIR/SKIN/NAILS) TABS Take 1 tablet by mouth daily.   Yes [provider]  [facility] powder Apply topically. 01/18/21  Yes [provider]  pantoprazole (PROTONIX) 40 MG tablet Take 40 mg by mouth daily.   Yes [provider]  rOPINIRole (REQUIP) 0.5 MG tablet Place 1 tablet (0.5 mg total) into feeding tube at bedtime. 02/13/20  Yes Kasa, Maretta Bees, MD  sodium chloride 0.9 % nebulizer solution sodium chloride 0.9 % for nebulization  USE 5 ML BY NEBULIZTION AS NEEDED FOR WHEEZING 07/17/19  Yes [provider]  Scheduled Meds:  busPIRone  15 mg Per Tube BID   chlorhexidine gluconate (MEDLINE KIT)  15 mL Mouth Rinse BID   Chlorhexidine Gluconate Cloth  6 each Topical Q0600   escitalopram  30 mg Per Tube Daily   feeding supplement ([place] [place] STANDARD 1.4)  325 mL Per Tube QID   folic acid  1 mg Per Tube Daily   free water  30 mL Per Tube Q4H   heparin  5,000 Units Subcutaneous Q8H   insulin aspart  0-15 Units Subcutaneous Q4H   insulin detemir  8 Units Subcutaneous BID   ipratropium-albuterol  3 mL Nebulization Q6H   loratadine  10 mg Per Tube Daily   mouth rinse  15 mL Mouth Rinse 10 times per day   melatonin  5 mg Per Tube QHS   metoprolol tartrate  25 mg Per Tube Daily   pantoprazole sodium  40 mg Per Tube Daily   pravastatin  20 mg Per Tube q1800   rOPINIRole  0.5 mg Per Tube QHS   Continuous Infusions:  sodium chloride 50 mL/hr at 02/27/21 0600   ceFEPime (MAXIPIME) IV Stopped (02/27/21 0235)   metronidazole 500 mg (02/27/21 0819)    vancomycin     PRN Meds:.acetaminophen, acetaminophen, diazepam, docusate, ipratropium-albuterol, metoprolol tartrate   ASSESSMENT & PLAN  Acute on Chronic Hypoxic Respiratory Failure secondary to suspected aspiration in the setting of 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 -continue ventilator support & lung protective strategies -Wean PEEP & FiO2 as tolerated, maintain SpO2 > 90% -Head of bed elevated 30 degrees -Intermittent chest x-ray & ABG PRN -Ensure adequate pulmonary hygiene  -bronchodilators PRN -Lopressor  PRN from 2.5 to 5 mg Q 6 PRN -Xanax TID added back as PRN for anxiety  Sepsis due to suspected Abdominal Abscess & recurrent Aspiration Pneumonia Met sepsis criteria on admission Follow up Chest CT with right pleural effusion and bilateral lower lobe consolidation concerning for aspiration pneumonia -F/u cultures, trend lactic/ PCT -Monitor WBC/ fever curve -IV antibiotics: cefepime & vancomycin&Flagyl -IVF hydration as needed -Pressors for MAP goal >65 -Strict I/O's   Possible subcutaneous periumbilical abscess -CT Abdomen/Pelvis concerning for possible abscess  -Per surgery restart continuous TF at low rate,if aspiration continues will obtain GI series with oral  contrast to the gastrostomy can be helpful.  If gastroparesis is not the cause of her reflux and aspiration another option is to consider exchanging the gastrostomy tube to a gastrojejunostomy tube under fluoroscopic guidance. -Consider Reglan for GI motility -General surgery consult appreciated    Diabetes mellitus      -CBG's -SSI and Lantus -Follow ICU Hypo/hyperglycemia protocol    Nutritional Status/Diet -Will start trickle TF per PEG. General Surgery ok to use -Dietician following, TF's as tolerated -Constipation protocol as indicated    Electrolytes -follow labs as needed -replace as needed -pharmacy consultation and following    Best practice (right  click and "Reselect all SmartList Selections" daily)  Diet:  Tube Feed  Pain/Anxiety/Delirium protocol (if indicated): Yes (RASS goal 0) VAP protocol (if indicated): Yes DVT prophylaxis: Subcutaneous Heparin GI prophylaxis: PPI Glucose control:  SSI Yes Central venous access:  N/A Arterial line:  N/A Foley:  Yes, and it is still needed Mobility:  bed rest  PT consulted: N/A Last date of multidisciplinary goals of care discussion [02/26/21] Code Status:  full code Disposition: ICU  Critical care provider statement:   Total critical care time: 33 minutes   Performed by: Lanney Gins MD   Critical care time was exclusive of separately billable procedures and treating other patients.   Critical care was necessary to treat or prevent imminent or life-threatening deterioration.   Critical care was time spent personally by me on the following activities: development of treatment plan with patient and/or surrogate as well as nursing, discussions with consultants, evaluation of patient's response to treatment, examination of patient, obtaining history from patient or surrogate, ordering and performing treatments and interventions, ordering and review of laboratory studies, ordering and review of radiographic studies, pulse oximetry and re-evaluation of patient's condition.    Ottie Glazier, M.D.  Pulmonary & Critical Care Medicine

## 2021-02-27 NOTE — Progress Notes (Signed)
OT Cancellation Note  Patient Details Name: Angela David MRN: [id number] DOB: 1940/04/27   Cancelled Treatment:    Reason Eval/Treat Not Completed: Fatigue/lethargy limiting ability to participate;Patient declined, no reason specified. Chart reviewed. Upon arrival pt completing PT session, pt opens eye intermittently and shakes head at request for bed level UE exercises. Will re-attempt at later time/date as able.   Dessie Coma, M.S. OTR/L  02/27/21, 10:15 AM  ascom [phone]

## 2021-02-27 NOTE — Progress Notes (Signed)
Nutrition Follow Up Note   DOCUMENTATION CODES:   Not applicable  INTERVENTION:   Change to [facility] Standard 1.4 formula continuous with goal of 68m/hr- Continue at 286mhr, once ok per MD, advance by 1047mr q 8 hours until goal rate is reached.  Free water flushes 39m64m hours to maintain tube patency   Regimen provides 2016kcal/day, 89g/day protein and 1337ml39m free water   NUTRITION DIAGNOSIS:   Inadequate oral intake related to dysphagia as evidenced by NPO status (pt with chronic G-tube).  GOAL:   Patient will meet greater than or equal to 90% of their needs -not met   MONITOR:   Labs, Weight trends, Skin, I & O's, TF tolerance  ASSESSMENT:   80 ye56 old female with PMHx of breast cancer s/p lumpectomy and chemo/XRT, DM, diabetic peripheral neuropathy, GERD, vocal cord paralysis, tracheal stenosis requiring chronic tracheostomy, aspiration PNA, chronic G-tube (placed surgically 01/29/2020), s/p hand-assisted laparoscopic right hemicolectomy by Dr. PabonDahlia Byes/11/2018 and s/p right partial mastectomy and robotic assisted incisional ventral hernia repair on 10/18/2020 who is now admitted with sepsis and respiratory distress.  Pt with concerns for aspiration. Pt changed over to continuous feeds and is tolerating well at trickle rate of 20ml/23mWill advance to goal rate once ok per MD. Per chart, pt down ~4lbs since admit; pt -3.6L on her I & Os. Pt s/p CT scan yesterday; G tube noted in good position.   Medications reviewed and include: folic acid, heparin, insulin, melatonin, minocin, protonix, NaCl '@50ml' /hr   Labs reviewed: Na 146(H), K 3.6 wnl, BUN 32(H), P 2.4(L), Mg 1.9 wnl Cbgs- 117, 165, 159 x 24 hrs  Diet Order:   Diet Order             Diet NPO time specified  Diet effective midnight                  EDUCATION NEEDS:   Education needs have been addressed  Skin:  Skin Assessment: Reviewed RN Assessment (ecchymosis)  Last BM:  1/2- type  6  Height:   Ht Readings from Last 1 Encounters:  02/27/21 5' 4.02" (1.626 m)    Weight:   Wt Readings from Last 1 Encounters:  02/27/21 75.2 kg    Ideal Body Weight:  54.5 kg  BMI:  Body mass index is 28.44 kg/m.  Estimated Nutritional Needs:   Kcal:  1700-1900kcal/day  Protein:  85-95g/day  Fluid:  1.4-1.6L/day  Macon Sandiford Koleen DistanceD, LDN Please refer to [facility] and/or RD on-call/weekend/after hours pager

## 2021-02-27 NOTE — Progress Notes (Signed)
Physical Therapy Treatment Patient Details Name: Angela David MRN: [id number] DOB: 26-Aug-1940 Today's Date: 02/27/2021   History of Present Illness 81 y.o with significant PMH of remote breast cancer in 2002 s/p chemo and radiation, DM2, ventral hernia s/p robotic incisional hernia repair and right upper outer quadrant mass s/p RFA guided right lumpectomy (09/2020), peripheral neuropathy, GERD, vocal cord paralysis s/p chronic trach and PEG who presented to the ED with chief complaints of fevers, chills and SOB.    PT Comments    Pt is making limited progress towards goals and appears more sedated this session. Per RN, no sedation meds given but pt very medically fragile. Poor participation with mostly AAROM performed. Vitals remained stable throughout session. Will continue to progress as able.   Recommendations for follow up therapy are one component of a multi-disciplinary discharge planning process, led by the attending physician.  Recommendations may be updated based on patient status, additional functional criteria and insurance authorization.  Follow Up Recommendations  Skilled nursing-short term rehab (<3 hours/day)     Assistance Recommended at Discharge Frequent or constant Supervision/Assistance  Equipment Recommendations  None recommended by PT    Recommendations for Other Services       Precautions / Restrictions Precautions Precautions: Fall Restrictions Weight Bearing Restrictions: No     Mobility  Bed Mobility               General bed mobility comments: not performed due to decreased arousal.    Transfers                        Ambulation/Gait                   Stairs             Wheelchair Mobility    Modified Rankin (Stroke Patients Only)       Balance                                            Cognition Arousal/Alertness: Lethargic Behavior During Therapy: WFL for tasks  assessed/performed;Flat affect Overall Cognitive Status: No family/caregiver present to determine baseline cognitive functioning                                 General Comments: very lethargic and medically compromised this date. Occasionally can open eyes to stimulus.        Exercises Other Exercises Other Exercises: supine AAROM performed on B LE including hip abd/add, SLRs, AP, and heel slides. All ROM x 8-10 reps with cues for attention to task and to maintain alertness. Pt with minimal response/participation to 1-2 reps and then fatigues rapidly.    General Comments        Pertinent Vitals/Pain Pain Assessment: No/denies pain    Home Living                          Prior Function            PT Goals (current goals can now be found in the care plan section) Acute Rehab PT Goals Patient Stated Goal: unable to state PT Goal Formulation: With patient Time For Goal Achievement: 03/10/21 Potential to Achieve Goals: Good Progress towards PT goals: Progressing toward goals  Frequency    Min 2X/week      PT Plan Current plan remains appropriate    Co-evaluation              AM-PAC PT "6 Clicks" Mobility   Outcome Measure  Help needed turning from your back to your side while in a flat bed without using bedrails?: Total Help needed moving from lying on your back to sitting on the side of a flat bed without using bedrails?: Total Help needed moving to and from a bed to a chair (including a wheelchair)?: Total Help needed standing up from a chair using your arms (e.g., wheelchair or bedside chair)?: Total Help needed to walk in hospital room?: Total Help needed climbing 3-5 steps with a railing? : Total 6 Click Score: 6    End of Session Equipment Utilized During Treatment:  (vent) Activity Tolerance: Patient limited by fatigue Patient left: in bed;with nursing/sitter in room Nurse Communication: Mobility status;Precautions PT  Visit Diagnosis: Unsteadiness on feet (R26.81);Muscle weakness (generalized) (M62.81);Difficulty in walking, not elsewhere classified (R26.2)     Time: 8657-8469 PT Time Calculation (min) (ACUTE ONLY): 8 min  Charges:  $Therapeutic Exercise: 8-22 mins                     Greggory Stallion, PT, DPT [phone]    Angela David 02/27/2021, 11:53 AM

## 2021-02-28 DIAGNOSIS — R0603 Acute respiratory distress: Secondary | ICD-10-CM | POA: Diagnosis not present

## 2021-02-28 LAB — GLUCOSE, CAPILLARY
Glucose-Capillary: 151 mg/dL — ABNORMAL HIGH (ref 70–99)
Glucose-Capillary: 158 mg/dL — ABNORMAL HIGH (ref 70–99)
Glucose-Capillary: 180 mg/dL — ABNORMAL HIGH (ref 70–99)
Glucose-Capillary: 161 mg/dL — ABNORMAL HIGH (ref 70–99)
Glucose-Capillary: 167 mg/dL — ABNORMAL HIGH (ref 70–99)

## 2021-02-28 LAB — CULTURE, RESPIRATORY W GRAM STAIN: Gram Stain: NONE SEEN

## 2021-02-28 LAB — CBC
HCT: 37.2 % (ref 36.0–46.0)
Hemoglobin: 11.5 g/dL — ABNORMAL LOW (ref 12.0–15.0)
MCH: 26.8 pg (ref 26.0–34.0)
MCHC: 30.9 g/dL (ref 30.0–36.0)
MCV: 86.7 fL (ref 80.0–100.0)
Platelets: 165 10*3/uL (ref 150–400)
RBC: 4.29 MIL/uL (ref 3.87–5.11)
RDW: 14.4 % (ref 11.5–15.5)
WBC: 8.7 10*3/uL (ref 4.0–10.5)
nRBC: 0.2 % (ref 0.0–0.2)

## 2021-02-28 LAB — CULTURE, BLOOD (ROUTINE X 2)
Culture: NO GROWTH
Culture: NO GROWTH
Special Requests: ADEQUATE
Special Requests: ADEQUATE

## 2021-02-28 LAB — MAGNESIUM: Magnesium: 2.2 mg/dL (ref 1.7–2.4)

## 2021-02-28 LAB — BASIC METABOLIC PANEL
Anion gap: 9 (ref 5–15)
BUN: 21 mg/dL (ref 8–23)
CO2: 28 mmol/L (ref 22–32)
Calcium: 8.8 mg/dL — ABNORMAL LOW (ref 8.9–10.3)
Chloride: 107 mmol/L (ref 98–111)
Creatinine, Ser: 0.93 mg/dL (ref 0.44–1.00)
GFR, Estimated: >60 mL/min (ref >60)
Glucose, Bld: 157 mg/dL — ABNORMAL HIGH (ref 70–99)
Potassium: 4.1 mmol/L (ref 3.5–5.1)
Sodium: 144 mmol/L (ref 135–145)

## 2021-02-28 LAB — URINE CULTURE: Culture: NO GROWTH

## 2021-02-28 LAB — PHOSPHORUS: Phosphorus: 2.2 mg/dL — ABNORMAL LOW (ref 2.5–4.6)

## 2021-02-28 MED ORDER — IPRATROPIUM-ALBUTEROL 0.5-2.5 (3) MG/3ML IN SOLN
3.0000 mL | Freq: Three times a day (TID) | RESPIRATORY_TRACT | Status: DC
  Administered 2021-02-28 – 2021-03-06 (×20): 3 mL via RESPIRATORY_TRACT
  Filled 2021-02-28 (×21): qty 3

## 2021-02-28 MED ORDER — KATE FARMS STANDARD 1.4 PO LIQD
1440.0000 mL | Freq: Every day | ORAL | Status: DC
  Administered 2021-02-28 – 2021-03-04 (×5): 1440 mL
  Filled 2021-02-28: qty 1625

## 2021-02-28 MED ORDER — KATE FARMS STANDARD 1.4 PO LIQD
1440.0000 mL | Freq: Every day | ORAL | Status: DC
  Filled 2021-02-28: qty 1625

## 2021-02-28 NOTE — Progress Notes (Signed)
CC: g tube Subjective: Angela David is very well known to me  I have done multiple abdominal procedure to include laparoscopic right colectomy, gastrostomy tube placement, ventral hernia repair.  She has been admitted due to hypoxia.  Patient also has a tracheostomy and is ventilator dependent intermittently.  Once again she deteriorated from pulm perspective. CT scan pers reviewed and compared to prior CT earlier last week, there is some postoperative changes next to the hernia repair and small seroma but no abscess. Clinically no evidence of infection or abscess G tube in place.  Now able to tolerate TF at low rate   Objective: Vital signs in last 24 hours: Temp:  [99.6 F (37.6 C)-100.2 F (37.9 C)] 100 F (37.8 C) (01/03 1137) Pulse Rate:  [80-104] 93 (01/03 1500) Resp:  [21-30] 23 (01/03 1500) BP: (90-182)/(42-85) 117/65 (01/03 1500) SpO2:  [95 %-100 %] 98 % (01/03 1500) FiO2 (%):  [35 %-40 %] 35 % (01/03 1407) Last BM Date: 02/27/21  Intake/Output from previous day: 01/02 0701 - 01/03 0700 In: 1678 [I.V.:1128; IV Piggyback:550] Out: 2145 [Urine:2145] Intake/Output this shift: Total I/O In: 574.7 [I.V.:474.7; IV Piggyback:100] Out: -   Physical exam: Constitutional: Sedated, on mechanical ventilation Respiratory: breathing non-labored at rest on mechanical ventilation Cardiovascular: regular rate and sinus rhythm  Gastrointestinal: soft, non-tender, and non-distended.  Gastrostomy in place.  No drainage or cellulitis  Lab Results: CBC  Recent Labs    02/27/21 0456 02/28/21 0524  WBC 8.4 8.7  HGB 11.0* 11.5*  HCT 35.0* 37.2  PLT 166 165   BMET Recent Labs    02/27/21 0456 02/28/21 0524  NA 146* 144  K 3.6 4.1  CL 106 107  CO2 29 28  GLUCOSE 193* 157*  BUN 32* 21  CREATININE 0.93 0.93  CALCIUM 8.4* 8.8*   PT/INR No results for input(s): LABPROT, INR in the last 72 hours. ABG No results for input(s): PHART, HCO3 in the last 72 hours.  Invalid input(s):  PCO2, PO2  Studies/Results: No results found.  Anti-infectives: Anti-infectives (From admission, onward)    Start     Dose/Rate Route Frequency Ordered Stop   02/27/21 1200  minocycline (MINOCIN) capsule 200 mg        200 mg Per Tube 2 times daily 02/27/21 1101     02/27/21 1000  vancomycin (VANCOCIN) IVPB 1000 mg/200 mL premix  Status:  Discontinued        1,000 mg 200 mL/hr over 60 Minutes Intravenous Every 24 hours 02/26/21 0849 02/27/21 1052   02/26/21 1000  vancomycin (VANCOREADY) IVPB 1500 mg/300 mL        1,500 mg 150 mL/hr over 120 Minutes Intravenous  Once 02/26/21 0849 02/26/21 1508   02/24/21 2130  metroNIDAZOLE (FLAGYL) IVPB 500 mg  Status:  Discontinued        500 mg 100 mL/hr over 60 Minutes Intravenous Every 12 hours 02/24/21 2032 02/27/21 1055   02/22/21 2100  vancomycin (VANCOREADY) IVPB 1250 mg/250 mL  Status:  Discontinued        1,250 mg 166.7 mL/hr over 90 Minutes Intravenous Every 24 hours 02/22/21 1811 02/24/21 1029   02/21/21 2200  vancomycin (VANCOCIN) IVPB 1000 mg/200 mL premix  Status:  Discontinued        1,000 mg 200 mL/hr over 60 Minutes Intravenous Every 24 hours 02/20/21 1619 02/21/21 1240   02/21/21 2200  vancomycin (VANCOREADY) IVPB 1250 mg/250 mL  Status:  Discontinued        1,250 mg  166.7 mL/hr over 90 Minutes Intravenous Every 24 hours 02/21/21 1240 02/22/21 1051   02/21/21 0200  ceFEPIme (MAXIPIME) 2 g in sodium chloride 0.9 % 100 mL IVPB  Status:  Discontinued        2 g 200 mL/hr over 30 Minutes Intravenous Every 12 hours 02/20/21 1618 02/27/21 1055   02/20/21 1700  vancomycin (VANCOREADY) IVPB 1500 mg/300 mL        1,500 mg 150 mL/hr over 120 Minutes Intravenous  Once 02/20/21 1618 02/21/21 0118   02/20/21 1345  ceFEPIme (MAXIPIME) 2 g in sodium chloride 0.9 % 100 mL IVPB        2 g 200 mL/hr over 30 Minutes Intravenous  Once 02/20/21 1344 02/20/21 1632   02/20/21 1345  metroNIDAZOLE (FLAGYL) IVPB 500 mg        500 mg 100 mL/hr over 60  Minutes Intravenous  Once 02/20/21 1344 02/20/21 1932       Assessment/Plan:  No surgical issues at this time May advance TF slowly  Due to vent issues probably unlikely to get this exchange to [facility] ( by IR) GI usually does not perform the procedure D/w Dr. Lanney Gins in detail I spent > 35 min in this encounter including pers reviewing images, coordinating her care and discussing w consultants and nutritional team. We will be available.  Caroleen Hamman, MD, [facility]  02/28/2021

## 2021-02-28 NOTE — Progress Notes (Signed)
Occupational Therapy Treatment Patient Details Name: Angela David MRN: [id number] DOB: [birth year] Today's Date: 02/28/2021   History of present illness 81 y.o with significant PMH of remote breast cancer in 2002 s/p chemo and radiation, DM2, ventral hernia s/p robotic incisional hernia repair and right upper outer quadrant mass s/p RFA guided right lumpectomy (09/2020), peripheral neuropathy, GERD, vocal cord paralysis s/p chronic trach and PEG who presented to the ED with chief complaints of fevers, chills and SOB.   OT comments  Angela David was seen for OT treatment on this date. Upon arrival to room pt reclined in bed, agreeable to tx. Pt answers questions appropriately by mouthing words and able to vocalize short 1 word responses with effort. Reviewed bed level BUE HEP as described below and pt instructed on positioning to prevent contractures. Pt requires MOD A hand over hand assist for oral care at bed level. Deferred mobility at pt/RN request. Pt making progress toward goals. Pt continues to benefit from skilled OT services to maximize return to PLOF and minimize risk of future falls, injury, caregiver burden, and readmission. Will continue to follow POC. Discharge recommendation remains appropriate.     Recommendations for follow up therapy are one component of a multi-disciplinary discharge planning process, led by the attending physician.  Recommendations may be updated based on patient status, additional functional criteria and insurance authorization.    Follow Up Recommendations  Skilled nursing-short term rehab (<3 hours/day)    Assistance Recommended at Discharge Frequent or constant Supervision/Assistance  Patient can return home with the following  Two people to help with walking and/or transfers;Two people to help with bathing/dressing/bathroom   Equipment Recommendations  Hospital bed    Recommendations for Other Services      Precautions / Restrictions  Precautions Precautions: Fall Restrictions Weight Bearing Restrictions: No       Mobility Bed Mobility               General bed mobility comments: deferred at pt request               ADL either performed or assessed with clinical judgement   ADL Overall ADL's : Needs assistance/impaired                                       General ADL Comments: MOD A hand over hand assist for oral care at bed level.      Cognition Arousal/Alertness: Awake/alert Behavior During Therapy: WFL for tasks assessed/performed;Flat affect Overall Cognitive Status: Within Functional Limits for tasks assessed                                            Exercises Exercises: General Upper Extremity General Exercises - Upper Extremity Shoulder Flexion: AAROM;Strengthening;Left;10 reps;Supine Shoulder ABduction: AAROM;Strengthening;Left;10 reps;Supine Shoulder ADduction: AAROM;Strengthening;Left;10 reps;Supine Elbow Flexion: AAROM;Strengthening;Left;10 reps;Supine Elbow Extension: AAROM;Strengthening;Left;10 reps;Supine Wrist Flexion: AAROM;Strengthening;Left;10 reps;Supine Wrist Extension: AAROM;Strengthening;Left;10 reps;Supine    Pertinent Vitals/ Pain       Pain Assessment: No/denies pain   Frequency  Min 2X/week        Progress Toward Goals  OT Goals(current goals can now be found in the care plan section)  Progress towards OT goals: Progressing toward goals  Acute Rehab OT Goals Patient Stated Goal: to have her tube feeds OT Goal  Formulation: With patient Time For Goal Achievement: 03/10/21 Potential to Achieve Goals: Fair ADL Goals Pt Will Perform Grooming: with min assist;sitting Pt Will Transfer to Toilet: with mod assist;with +2 assist;bedside commode;stand pivot transfer Pt Will Perform Toileting - Clothing Manipulation and hygiene: with max assist;sitting/lateral leans  Plan Discharge plan remains appropriate;Frequency  remains appropriate    Co-evaluation                 AM-PAC OT "6 Clicks" Daily Activity     Outcome Measure   Help from another person eating meals?: A Lot Help from another person taking care of personal grooming?: A Lot Help from another person toileting, which includes using toliet, bedpan, or urinal?: A Lot Help from another person bathing (including washing, rinsing, drying)?: A Lot Help from another person to put on and taking off regular upper body clothing?: A Lot Help from another person to put on and taking off regular lower body clothing?: A Lot 6 Click Score: 12    End of Session    OT Visit Diagnosis: Other abnormalities of gait and mobility (R26.89);Muscle weakness (generalized) (M62.81)   Activity Tolerance Patient tolerated treatment well;Patient limited by fatigue   Patient Left in bed;with call bell/phone within reach;with bed alarm set;with nursing/sitter in room;with family/visitor present   Nurse Communication Mobility status        Time: 8453-6468 OT Time Calculation (min): 15 min  Charges: OT General Charges $OT Visit: 1 Visit OT Treatments $Self Care/Home Management : 8-22 mins  Dessie Coma, M.S. OTR/L  02/28/21, 4:20 PM  ascom [phone]

## 2021-02-28 NOTE — Progress Notes (Signed)
NAME:  Angela David, MRN:  [id number], DOB:  1940/03/01, LOS: 5 ADMISSION DATE:  02/20/2021, CONSULTATION DATE:  02/20/2021 REFERRING MD: Imagene Sheller, MD. 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   ED Course: Temperature 101.5 F, BP 126/65, HR 111, RR 27, SPO2 87% room air.  CT abdomen/pelvis with potential small focus subcutaneous fluid collection left of the umbilicus measuring 2.4 x 1.5 x 1.2 cm concerning for possible abscess.  General surgery consulted for further evaluation.  Patient now with also O2 requirement with possible pneumonia but no infiltrates on x-ray.  PCCM was consulted for assistance with vent management.  Patient was started on broad-spectrum antibiotics and admitted to the hospitalist service   02/27/21- patient remains on ventilator. S/p sugery evaluation of PEG tube. Resp cultures are stenotrophomonas.  She has pneumonia with parapneumonic effusion concerning for possible empyema. [name] at bedside reveals no pleural effusion bilaterally 02/29/20- patient weaned down on FIO2 today , will attemp SBT in am for liberation from MV. Diuresed well today >3L with improved resp status  Pertinent  Medical History   Right Breast Cancer Tx'd with chemotherapy and XRT    Chronic respiratory failure ([place])    s/p tracheostomy in 2012; on ventilatory support at hs  Diabetic peripheral neuropathy ([place]) 08/28/2016  GERD (gastroesophageal reflux disease)    Glaucoma    High cholesterol    History of  colon polyps    Osteoarthritis    Personal history of chemotherapy 2002  BREAST CA  Personal history of radiation therapy 2002  BREAST CA  S/P percutaneous endoscopic gastrostomy (PEG) tube placement ([place])    T2DM (type 2 diabetes mellitus) ([place])    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 PCCM consulted for vent management 12/26: General surgery consulted 12/27: Remains on full vent support. 12/28: Remains agitated on the vent. CT head and chest obtained 12/30: Patient with episode of respiratory distress due to suspected aspiration. Bolus feed held.STAT CXR performed which revealed a worsening R pleural effusion. 12/31: Remains on the vent.  Cultures:  01/01 BCx: pending 01/01 UCx: pending 12/28 BCx: NGTD 12/26 BCx: NG final 12/30 RCx: NG final 12/26 MRSA PCR: not detected  12/26 respiratory panel: negative 12/26 SARS CoV-2: negative 12/26 influenza A/B: negative 12/26: Strep pneumo urinary antigen>>negative 12/26: Legionella urinary antigen>>negative   Antimicrobials:  12/26 vancomycin >> 12/29    01/01 >> 12/30 metronidazole >> 12/26 cefepime >> Interim History / Subjective:  -No acute events overnight. -Remains febrile, cultures/test obtained and pending  OBJECTIVE   Blood pressure 123/62, pulse 94, temperature 99.8 F (37.7 C), temperature source Axillary, resp. rate (!) 23, height 5' 4.02" (1.626 m), weight 75.2 kg, SpO2 98 %.    Vent Mode: SIMV/PC/PS FiO2 (%):  [35 %-40 %] 35 % Set Rate:  [5 bmp-14 bmp] 10 bmp  PEEP:  [5 cmH20] 5 cmH20 Pressure Support:  [8 cmH20-10 cmH20] 10 cmH20 Plateau Pressure:  [15 cmH20-18 cmH20] 15 cmH20   Intake/Output Summary (Last 24 hours) at 02/28/2021 1742 Last  data filed at 02/28/2021 1600 Gross per 24 hour  Intake 1752.62 ml  Output 1645 ml  Net 107.62 ml    Filed Weights   02/25/21 0316 02/26/21 0402 02/27/21 0451  Weight: 73.4 kg 73.7 kg 75.2 kg   Examination: GENERAL:age-year-old patient lying in the bed on the vent via trach EYES: Pupils equal, round, reactive to light and accommodation. No scleral icterus. Extraocular muscles intact.  HEENT: Head atraumatic, normocephalic. Oropharynx and nasopharynx clear.  NECK:  Supple, no jugular venous distention. No thyroid enlargement, no tenderness.  LUNGS: Normal breath sounds bilaterally, no wheezing, rales,rhonchi or crepitation. No use of accessory muscles of respiration.  CARDIOVASCULAR: S1, S2 normal. No murmurs, rubs, or gallops.  ABDOMEN: Soft, nontender, nondistended. Bowel sounds present. No organomegaly or mass. PEG tube in place EXTREMITIES: No pedal edema, cyanosis, or clubbing.  NEUROLOGIC: Cranial nerves II through XII are intact. Muscle strength 5/5 in all extremities. Sensation intact. Gait not checked.  PSYCHIATRIC: The patient is alert and oriented x 1. Nods appropriately SKIN: No obvious rash, lesion, or ulcer.   Labs/imaging that I havepersonally reviewed  (right click and "Reselect all SmartList Selections" daily)    Labs   CBC: Recent Labs  Lab 02/23/21 0449 02/25/21 0407 02/26/21 0414 02/27/21 0456 02/28/21 0524  WBC 6.0 5.5 7.6 8.4 8.7  HGB 13.2 13.5 13.1 11.0* 11.5*  HCT 42.0 43.5 43.0 35.0* 37.2  MCV 85.0 86.7 88.8 85.6 86.7  PLT 143* 171 180 166 165     Basic Metabolic Panel: Recent Labs  Lab 02/24/21 0403 02/25/21 0407 02/26/21 0414 02/26/21 2016 02/26/21 2225 02/27/21 0456 02/28/21 0524  NA 139 142 144  --   --  146* 144  K 3.3* 4.3 3.4*  --  3.3* 3.6 4.1  CL 101 101 102  --   --  106 107  CO2 29 32 32  --   --  29 28  GLUCOSE 157* 231* 198*  --   --  193* 157*  BUN 30* 28* 36*  --   --  32* 21  CREATININE 0.87 0.85 1.14*  --   --  0.93  0.93  CALCIUM 7.9* 8.2* 8.5*  --   --  8.4* 8.8*  MG 1.8 2.4 2.0  --   --  1.9 2.2  PHOS 1.7* 3.0 1.6* 2.3*  --  2.4* 2.2*    GFR: Estimated Creatinine Clearance: 49.5 mL/min (by C-G formula based on SCr of 0.93 mg/dL). Recent Labs  Lab 02/22/21 0413 02/22/21 1929 02/23/21 0449 02/24/21 0403 02/25/21 0407 02/26/21 0414 02/27/21 0456 02/28/21 0524  PROCALCITON 0.21  --  1.98 1.33 1.46  --   --   --   WBC 5.8  --  6.0  --  5.5 7.6 8.4 8.7  LATICACIDVEN  --  1.7 2.1*  --   --   --   --   --      Liver Function Tests: Recent Labs  Lab 02/26/21 0414 02/27/21 0456  AST 30 27  ALT 26 20  ALKPHOS 77 68  BILITOT 0.8 0.8  PROT 6.8 6.4*  ALBUMIN 2.7* 2.7*    No results for input(s): LIPASE, AMYLASE in the last 168 hours. No results for input(s): AMMONIA in the last 168 hours.  ABG  Component Value Date/Time   PHART 7.44 02/03/2020 1055   PCO2ART 53 (H) 02/03/2020 1055   PO2ART 92 02/03/2020 1055   HCO3 36.0 (H) 02/03/2020 1055   ACIDBASEDEF 0.9 01/19/2020 1024   O2SAT 97.4 02/03/2020 1055      Coagulation Profile: No results for input(s): INR, PROTIME in the last 168 hours.   Cardiac Enzymes: No results for input(s): CKTOTAL, CKMB, CKMBINDEX, TROPONINI in the last 168 hours.  HbA1C: Hgb A1c MFr Bld  Date/Time Value Ref Range Status  01/20/2020 07:19 AM 6.3 (H) 4.8 - 5.6 % Final    Comment:    (NOTE) Pre diabetes:          5.7%-6.4%  Diabetes:              >6.4%  Glycemic control for   <7.0% adults with diabetes   03/21/2019 03:24 AM 6.6 (H) 4.8 - 5.6 % Final    Comment:    (NOTE) Pre diabetes:          5.7%-6.4% Diabetes:              >6.4% Glycemic control for   <7.0% adults with diabetes     CBG: Recent Labs  Lab 02/27/21 2340 02/28/21 0425 02/28/21 0729 02/28/21 1128 02/28/21 1658  GLUCAP 162* 151* 158* 180* 161*     Allergies Allergies  Allergen Reactions   Shellfish Allergy Shortness Of Breath    respitatory distress     Sodium Sulfite Shortness Of Breath    Severe Congestion that creates inability to breath   Sulfa Antibiotics Shortness Of Breath and Other (See Comments)    respitatory distress   Codeine Other (See Comments)    Per patient "it kept her up all night"   Glucerna [Alimentum] Itching   Wound Dressing Adhesive Rash      Home Medications  Prior to Admission medications   Medication Sig Start Date End Date Taking? Authorizing Provider  acetaminophen (TYLENOL) 500 MG tablet Take 1,000 mg by mouth in the morning and at bedtime.   Yes [provider]  busPIRone (BUSPAR) 15 MG tablet Place 15 mg into feeding tube 2 (two) times daily. 09/26/20  Yes [provider]  carboxymethylcellulose (REFRESH PLUS) 0.5 % SOLN Place 1 drop into both eyes 3 (three) times daily as needed (dry eyes).   Yes [provider]  denosumab (PROLIA) 60 MG/ML SOSY injection Inject 60 mg into the skin every 6 (six) months.   Yes [provider]  EPINEPHrine 0.3 mg/0.3 mL IJ SOAJ injection epinephrine 0.3 mg/0.3 mL injection, auto-injector  Inject 0.3 mLs (0.3 mg dose) into the muscle once as needed for Anaphylaxis for up to 1 dose. 09/10/18  Yes [provider]  escitalopram (LEXAPRO) 10 MG tablet Place 3 tablets (30 mg total) into feeding tube daily. Patient taking differently: Place 10 mg into feeding tube daily. 02/13/20  Yes Kasa, Maretta Bees, MD  escitalopram (LEXAPRO) 20 MG tablet Place 20 mg into feeding tube daily.   Yes [provider]  folic acid (FOLVITE) 1 MG tablet Place 1 tablet (1 mg total) into feeding tube daily. 02/13/20  Yes Flora Lipps, MD  gabapentin (NEURONTIN) 600 MG tablet Place 600 mg into feeding tube 3 (three) times daily.   Yes [provider]  lovastatin (MEVACOR) 20 MG tablet Place 20 mg into feeding tube daily. 06/13/20  Yes [provider]  meclizine (ANTIVERT) 25 MG tablet Take 25 mg by mouth daily. 02/17/21  Yes [provider]  melatonin 5 MG TABS Place 1 tablet (5 mg total) into feeding tube at bedtime. 02/13/20  Yes Flora Lipps, MD  metFORMIN (GLUCOPHAGE) 1000 MG tablet Place 1,000 mg into feeding tube daily with breakfast. 06/10/20  Yes [provider]  metoprolol tartrate (LOPRESSOR) 25 MG tablet Place 25 mg into feeding tube daily. 06/13/20  Yes [provider]  Multiple Vitamins-Minerals (HAIR/SKIN/NAILS) TABS Take 1 tablet by mouth daily.   Yes [provider]  [facility] powder Apply topically. 01/18/21  Yes [provider]  pantoprazole (PROTONIX) 40 MG tablet Take 40 mg by mouth daily.   Yes [provider]  rOPINIRole (REQUIP) 0.5 MG tablet Place 1 tablet (0.5 mg total) into feeding tube at bedtime. 02/13/20  Yes Kasa, Maretta Bees, MD  sodium chloride 0.9 % nebulizer solution sodium chloride 0.9 % for nebulization  USE 5 ML BY NEBULIZTION AS NEEDED FOR WHEEZING 07/17/19  Yes [provider]  Scheduled Meds:  busPIRone  15 mg Per Tube BID   chlorhexidine gluconate (MEDLINE KIT)  15 mL Mouth Rinse BID   Chlorhexidine Gluconate Cloth  6 each Topical Q0600   escitalopram  30 mg Per Tube Daily   feeding supplement (KATE [employer]ARMS STANDARD 1.4)  1,440 mL Per Tube Daily   folic acid  1 mg Per Tube Daily   free water  50 mL Per Tube Q4H   furosemide  40 mg Intravenous BID   heparin  5,000 Units Subcutaneous Q8H   insulin aspart  0-15 Units Subcutaneous Q4H   insulin detemir  8 Units Subcutaneous BID   ipratropium-albuterol  3 mL Nebulization TID   loratadine  10 mg Per Tube Daily   mouth rinse  15 mL Mouth Rinse 10 times per day   melatonin  5 mg Per Tube QHS   metoprolol tartrate  25 mg Per Tube Daily   minocycline  200 mg Per Tube BID   multivitamin  15 mL Per Tube Daily   pantoprazole sodium  40 mg Per Tube Daily   pravastatin  20 mg Per Tube q1800   rOPINIRole  0.5 mg Per Tube QHS   Continuous Infusions:  sodium chloride 50 mL/hr at 02/28/21  1600   albumin human 12.5 g (02/28/21 1135)   PRN Meds:.acetaminophen, acetaminophen, diazepam, docusate, ipratropium-albuterol, metoprolol tartrate   ASSESSMENT & PLAN  Acute on Chronic Hypoxic Respiratory Failure secondary to suspected aspiration in the setting of 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 -continue ventilator support & lung protective strategies -Wean PEEP & FiO2 as tolerated, maintain SpO2 > 90% -Head of bed elevated 30 degrees -Intermittent chest x-ray & ABG PRN -Ensure adequate pulmonary hygiene  -bronchodilators PRN -Lopressor  PRN from 2.5 to 5 mg Q 6 PRN -Xanax TID added back as PRN for anxiety  Sepsis due to suspected Abdominal Abscess & recurrent Aspiration Pneumonia Met sepsis criteria on admission Follow up Chest CT with right pleural effusion and bilateral lower lobe consolidation concerning for aspiration pneumonia -F/u cultures, trend lactic/ PCT -Monitor WBC/ fever curve -IV antibiotics: cefepime & vancomycin&Flagyl -IVF hydration as needed -Pressors for MAP goal >65 -Strict I/O's   Possible subcutaneous periumbilical abscess -CT Abdomen/Pelvis concerning for possible abscess  -Per surgery restart continuous TF at low rate,if aspiration continues will obtain GI series with oral contrast to the gastrostomy can be helpful.  If gastroparesis is not the cause of her reflux and aspiration another option is to consider exchanging the gastrostomy tube to  a gastrojejunostomy tube under fluoroscopic guidance. -Consider Reglan for GI motility -General surgery consult appreciated    Diabetes mellitus      -CBG's -SSI and Lantus -Follow ICU Hypo/hyperglycemia protocol    Nutritional Status/Diet -Will start trickle TF per PEG. General Surgery ok to use -Dietician following, TF's as tolerated -Constipation protocol as indicated    Electrolytes -follow labs as needed -replace as needed -pharmacy consultation and  following    Best practice (right click and "Reselect all SmartList Selections" daily)  Diet:  Tube Feed  Pain/Anxiety/Delirium protocol (if indicated): Yes (RASS goal 0) VAP protocol (if indicated): Yes DVT prophylaxis: Subcutaneous Heparin GI prophylaxis: PPI Glucose control:  SSI Yes Central venous access:  N/A Arterial line:  N/A Foley:  Yes, and it is still needed Mobility:  bed rest  PT consulted: N/A Last date of multidisciplinary goals of care discussion [02/26/21] Code Status:  full code Disposition: ICU  Critical care provider statement:   Total critical care time: 33 minutes   Performed by: Lanney Gins MD   Critical care time was exclusive of separately billable procedures and treating other patients.   Critical care was necessary to treat or prevent imminent or life-threatening deterioration.   Critical care was time spent personally by me on the following activities: development of treatment plan with patient and/or surrogate as well as nursing, discussions with consultants, evaluation of patient's response to treatment, examination of patient, obtaining history from patient or surrogate, ordering and performing treatments and interventions, ordering and review of laboratory studies, ordering and review of radiographic studies, pulse oximetry and re-evaluation of patient's condition.    Ottie Glazier, M.D.  Pulmonary & Critical Care Medicine

## 2021-03-01 ENCOUNTER — Inpatient Hospital Stay: Payer: Medicare Other

## 2021-03-01 DIAGNOSIS — A419 Sepsis, unspecified organism: Secondary | ICD-10-CM | POA: Diagnosis not present

## 2021-03-01 DIAGNOSIS — Z7189 Other specified counseling: Secondary | ICD-10-CM | POA: Diagnosis not present

## 2021-03-01 LAB — GLUCOSE, CAPILLARY
Glucose-Capillary: 207 mg/dL — ABNORMAL HIGH (ref 70–99)
Glucose-Capillary: 209 mg/dL — ABNORMAL HIGH (ref 70–99)
Glucose-Capillary: 215 mg/dL — ABNORMAL HIGH (ref 70–99)
Glucose-Capillary: 261 mg/dL — ABNORMAL HIGH (ref 70–99)
Glucose-Capillary: 317 mg/dL — ABNORMAL HIGH (ref 70–99)
Glucose-Capillary: 331 mg/dL — ABNORMAL HIGH (ref 70–99)

## 2021-03-01 LAB — CBC
HCT: 41 % (ref 36.0–46.0)
Hemoglobin: 13 g/dL (ref 12.0–15.0)
MCH: 27.1 pg (ref 26.0–34.0)
MCHC: 31.7 g/dL (ref 30.0–36.0)
MCV: 85.4 fL (ref 80.0–100.0)
Platelets: 207 10*3/uL (ref 150–400)
RBC: 4.8 MIL/uL (ref 3.87–5.11)
RDW: 13.7 % (ref 11.5–15.5)
WBC: 10.8 10*3/uL — ABNORMAL HIGH (ref 4.0–10.5)
nRBC: 0.2 % (ref 0.0–0.2)

## 2021-03-01 LAB — BASIC METABOLIC PANEL
Anion gap: 10 (ref 5–15)
BUN: 24 mg/dL — ABNORMAL HIGH (ref 8–23)
CO2: 31 mmol/L (ref 22–32)
Calcium: 9 mg/dL (ref 8.9–10.3)
Chloride: 98 mmol/L (ref 98–111)
Creatinine, Ser: 0.88 mg/dL (ref 0.44–1.00)
GFR, Estimated: >60 mL/min (ref >60)
Glucose, Bld: 218 mg/dL — ABNORMAL HIGH (ref 70–99)
Potassium: 3.8 mmol/L (ref 3.5–5.1)
Sodium: 139 mmol/L (ref 135–145)

## 2021-03-01 IMAGING — US US EXTREM LOW VENOUS*L*
1 series · 13 of 24 positions shown · non-contrast
Comparison: 12/16/2011

CLINICAL DATA: 78-year-old with left leg swelling for 2 weeks.



[Series 1: us extrem low venous*left* · 0.07mm/px · 13 of 34 slices shown]
[im 1/34]
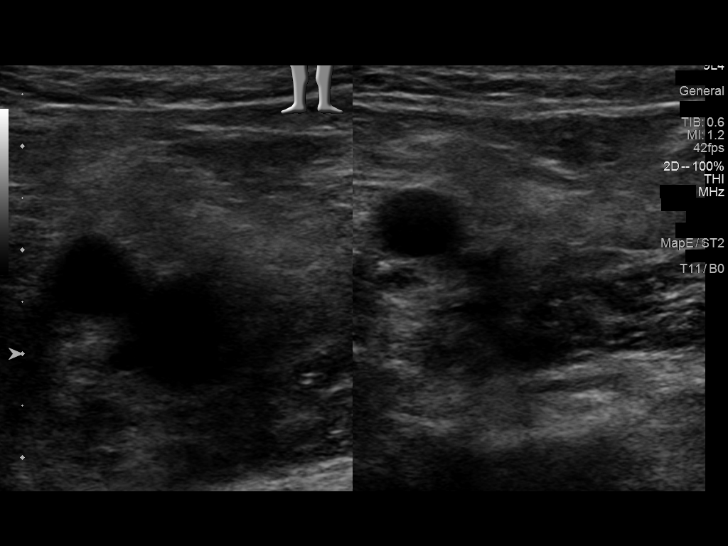
[im 3/34]
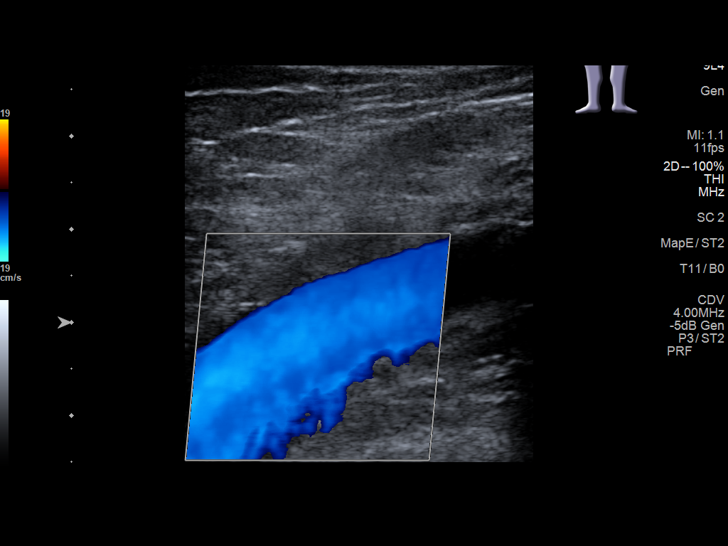
[im 6/34]
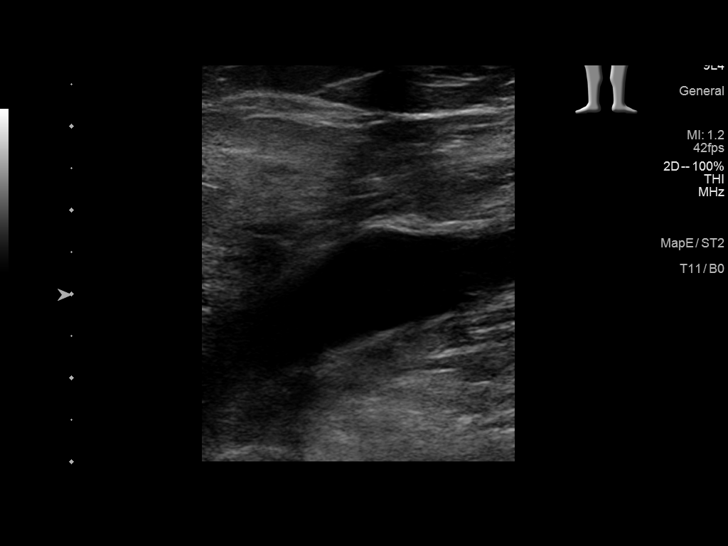
[im 9/34]
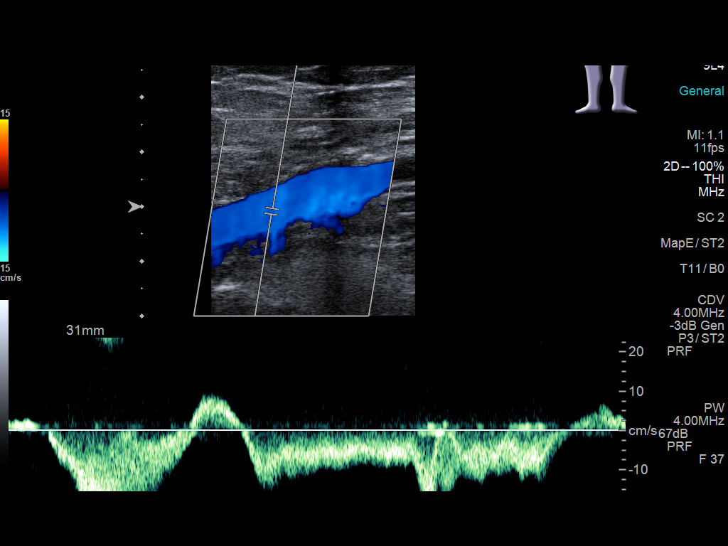
[im 12/34]
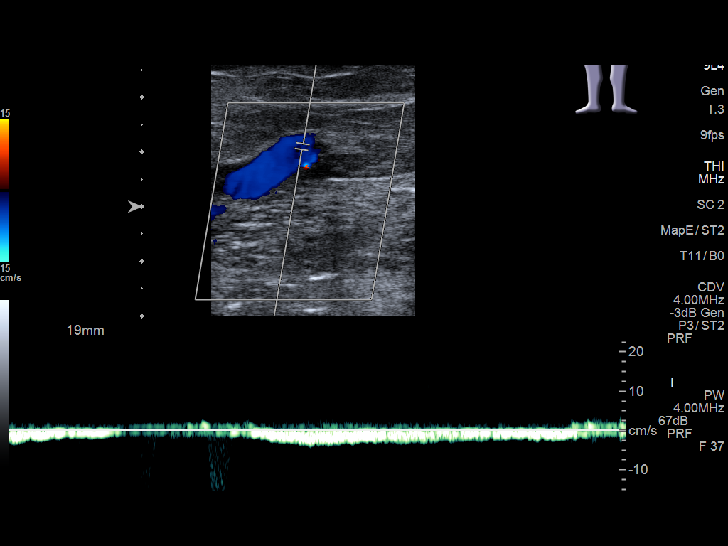
[im 15/34]
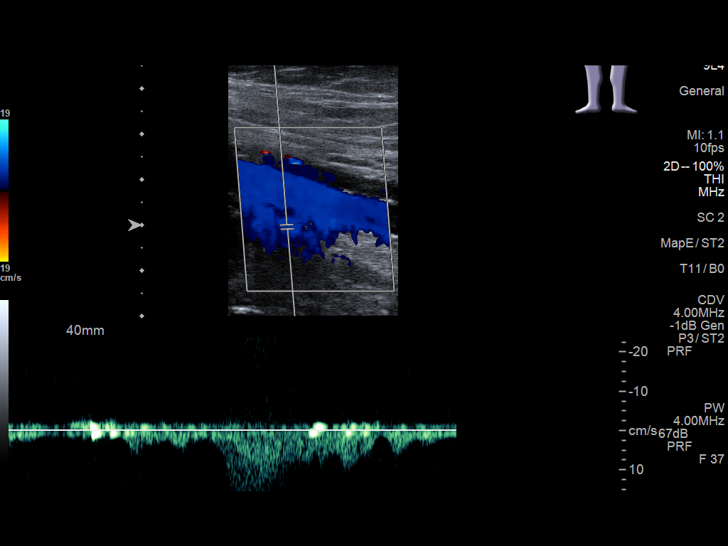
[im 18/34]
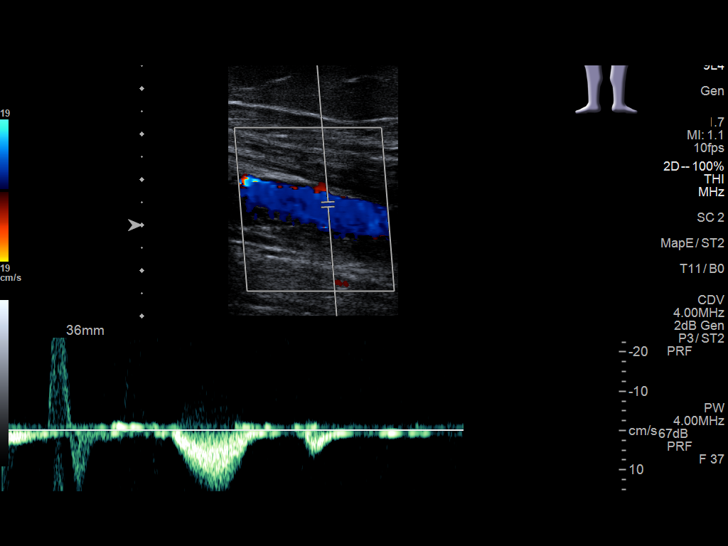
[im 19/34]
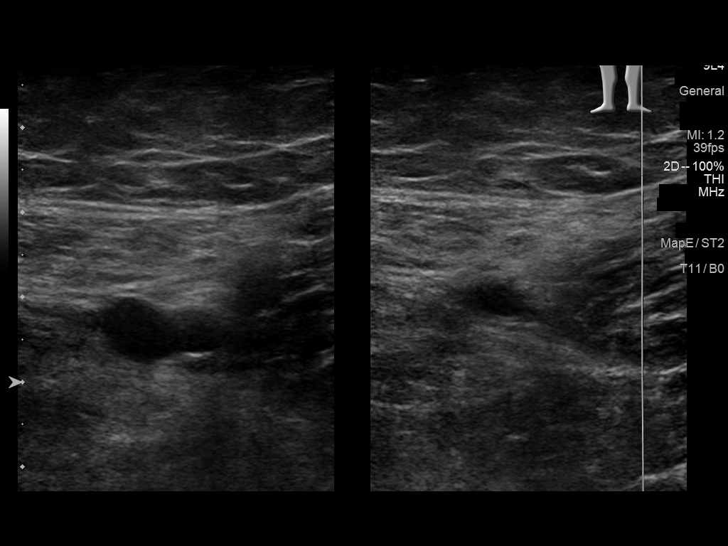
[im 22/34]
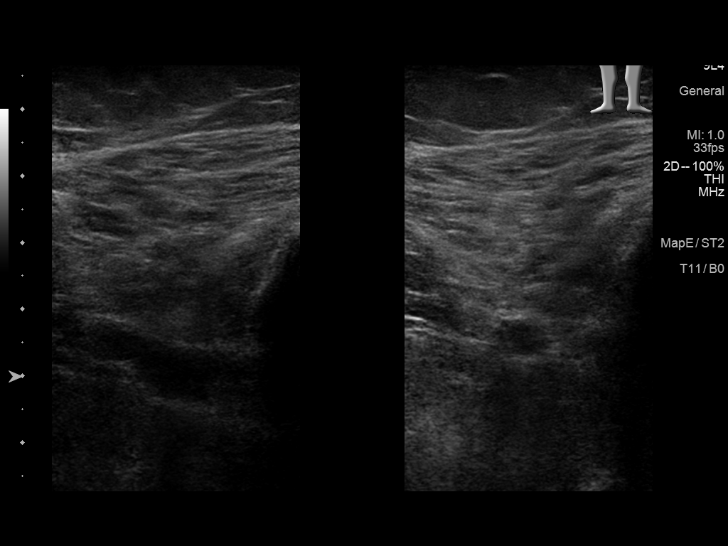
[im 25/34]
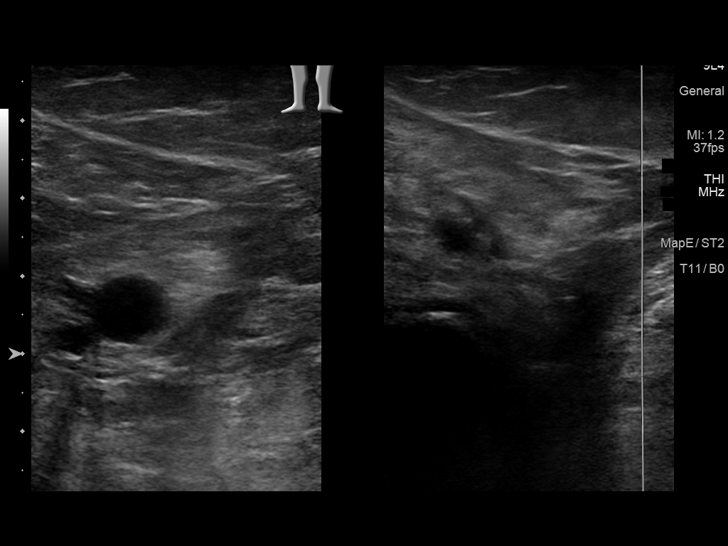
[im 28/34]
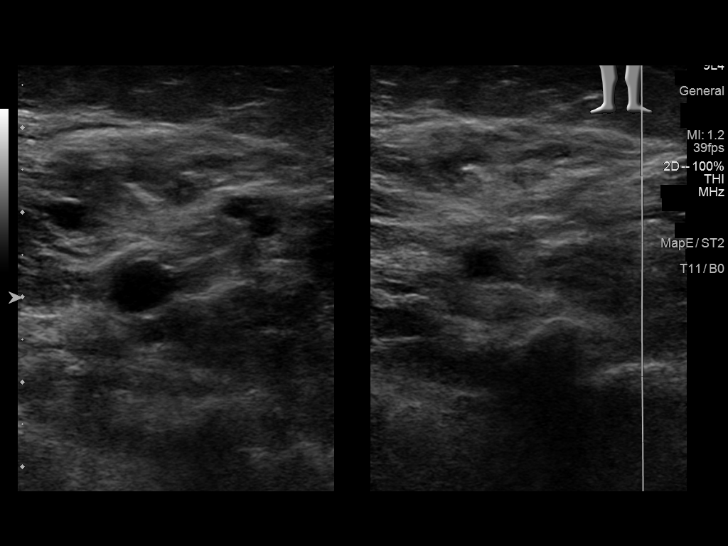
[im 31/34]
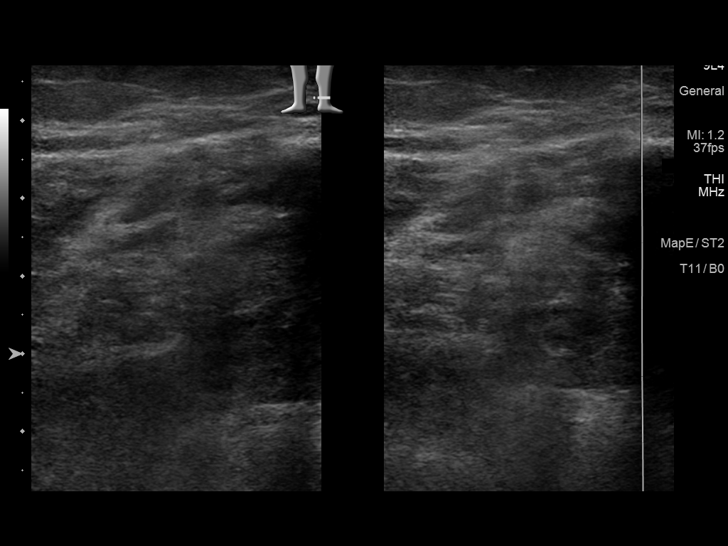
[im 34/34]
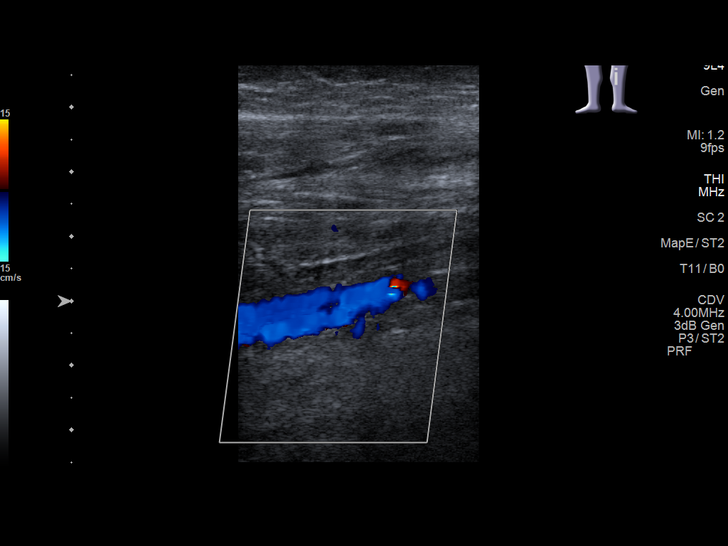

[13 of 24 positions shown; findings below may reference images not displayed]

FINDINGS: Contralateral Common Femoral Vein: Respiratory phasicity is normal
and symmetric with the symptomatic side. No evidence of thrombus.
Normal compressibility.

Common Femoral Vein: No evidence of thrombus. Normal
compressibility, respiratory phasicity and response to augmentation.

Saphenofemoral Junction: No evidence of thrombus. Normal
compressibility and flow on color Doppler imaging.

Profunda Femoral Vein: No evidence of thrombus. Normal
compressibility and flow on color Doppler imaging.

Femoral Vein: No evidence of thrombus. Normal compressibility,
respiratory phasicity and response to augmentation.

Popliteal Vein: No evidence of thrombus. Normal compressibility,
respiratory phasicity and response to augmentation.

Calf Veins: No evidence of thrombus. Normal compressibility and flow
on color Doppler imaging.

Other Findings:  None.
IMPRESSION: Negative for deep venous thrombosis in left lower extremity.

## 2021-03-01 MED ORDER — ENOXAPARIN SODIUM 40 MG/0.4ML IJ SOSY
40.0000 mg | PREFILLED_SYRINGE | Freq: Every day | INTRAMUSCULAR | Status: DC
  Administered 2021-03-01 – 2021-03-05 (×5): 40 mg via SUBCUTANEOUS
  Filled 2021-03-01 (×5): qty 0.4

## 2021-03-01 NOTE — Consult Note (Addendum)
° °                                                                                °Consultation Note °Date: 03/01/2021  ° °Patient Name: Angela David  °DOB: 08/14/1940  MRN: [id number]  Age / Sex: 80 y.o., female  °PCP: Richards, Michael Henry, MD °Referring Physician: Aleskerov, Fuad, MD ° °Reason for Consultation: Establishing goals of care ° °HPI/Patient Profile:80 y.o with significant PMH of remote breast cancer in 2002 s/p chemo and radiation, DM2, ventral hernia s/p robotic incisional hernia repair and right upper outer quadrant mass s/p RFA guided right lumpectomy (09/2020), peripheral neuropathy, GERD, vocal 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 °  ° °Clinical Assessment and Goals of Care: °Notes reviewed. Patient is resting in bed asleep. Husband is at bedside. He discusses her recent illness. He discusses that she loves to be with friends. He states due to covid she has not been able to go see friends, and has been isolated.  ° °Broached GOC. Husband states in regards to healthcare "she will never want to stop. We will never want to stop." CCM MD made aware of this.  ° °PMT will continue to follow.  °  ° °SUMMARY OF RECOMMENDATIONS   °Full scope.  ° ° ° °  ° °Primary Diagnoses: °Present on Admission: ° Sepsis (HCC) ° ° °I have reviewed the medical record, interviewed the patient and family, and examined the patient. The following aspects are pertinent. ° °Past Medical History:  °Diagnosis Date  ° Breast cancer, right (HCC) 2002  ° Tx'd with chemotherapy and XRT  ° Chronic respiratory failure (HCC)   ° s/p tracheostomy in 2012; on ventilatory support at hs  ° Diabetic peripheral neuropathy (HCC) 08/28/2016  ° GERD (gastroesophageal reflux disease)   ° Glaucoma   ° High cholesterol   ° History of colon polyps   ° Osteoarthritis   °  Personal history of chemotherapy 2002  ° BREAST CA  ° Personal history of radiation therapy 2002  ° BREAST CA  ° S/P percutaneous endoscopic gastrostomy (PEG) tube placement (HCC)   ° T2DM (type 2 diabetes mellitus) (HCC)   ° Tracheostomy dependent (HCC) 2012  ° Ventral hernia   ° Vocal cord paralysis 2012  ° °Social History  ° °Socioeconomic History  ° Marital status: Married  °  Spouse name: Joseph  ° Number of children: Not on file  ° Years of education: Not on file  ° Highest education level: Not on file  °Occupational History  ° Not on file  °Tobacco Use  ° Smoking status: Never  ° Smokeless tobacco: Never  °Vaping Use  ° Vaping Use: Never used  °Substance and Sexual Activity  ° Alcohol use: No  ° Drug use: No  ° Sexual activity: Not on file  °Other Topics Concern  ° Not on file  °Social History Narrative  ° Lives with husband  ° Right Handed  ° Drinks 2-3 cups caffeine daily  ° °Social Determinants of Health  ° °Financial Resource Strain:   Not on file  °Food Insecurity: Not on file  °Transportation Needs: Not on file  °Physical Activity: Not on file  °Stress: Not on file  °Social Connections: Not on file  ° °Family History  °Problem Relation Age of Onset  ° Cancer Father   °     bone cancer  ° Cancer Sister   °     lymphoma  ° Cancer Brother   ° Breast cancer Neg Hx   ° °Scheduled Meds: ° busPIRone  15 mg Per Tube BID  ° chlorhexidine gluconate (MEDLINE KIT)  15 mL Mouth Rinse BID  ° Chlorhexidine Gluconate Cloth  6 each Topical Q0600  ° enoxaparin (LOVENOX) injection  40 mg Subcutaneous QHS  ° escitalopram  30 mg Per Tube Daily  ° feeding supplement (KATE FARMS STANDARD 1.4)  1,440 mL Per Tube Daily  ° folic acid  1 mg Per Tube Daily  ° free water  50 mL Per Tube Q4H  ° furosemide  40 mg Intravenous BID  ° insulin aspart  0-15 Units Subcutaneous Q4H  ° insulin detemir  8 Units Subcutaneous BID  ° ipratropium-albuterol  3 mL Nebulization TID  ° loratadine  10 mg Per Tube Daily  ° mouth rinse  15 mL Mouth Rinse  10 times per day  ° melatonin  5 mg Per Tube QHS  ° metoprolol tartrate  25 mg Per Tube Daily  ° minocycline  200 mg Per Tube BID  ° multivitamin  15 mL Per Tube Daily  ° pantoprazole sodium  40 mg Per Tube Daily  ° pravastatin  20 mg Per Tube q1800  ° rOPINIRole  0.5 mg Per Tube QHS  ° °Continuous Infusions: ° albumin human 12.5 g (03/01/21 1206)  ° °PRN Meds:.acetaminophen, acetaminophen, docusate, ipratropium-albuterol, metoprolol tartrate °Medications Prior to Admission:  °Prior to Admission medications   °Medication Sig Start Date End Date Taking? Authorizing Provider  °acetaminophen (TYLENOL) 500 MG tablet Take 1,000 mg by mouth in the morning and at bedtime.   Yes [provider]  °busPIRone (BUSPAR) 15 MG tablet Place 15 mg into feeding tube 2 (two) times daily. 09/26/20  Yes [provider]  °carboxymethylcellulose (REFRESH PLUS) 0.5 % SOLN Place 1 drop into both eyes 3 (three) times daily as needed (dry eyes).   Yes [provider]  °denosumab (PROLIA) 60 MG/ML SOSY injection Inject 60 mg into the skin every 6 (six) months.   Yes [provider]  °EPINEPHrine 0.3 mg/0.3 mL IJ SOAJ injection epinephrine 0.3 mg/0.3 mL injection, auto-injector ° Inject 0.3 mLs (0.3 mg dose) into the muscle once as needed for Anaphylaxis for up to 1 dose. 09/10/18  Yes [provider]  °escitalopram (LEXAPRO) 10 MG tablet Place 3 tablets (30 mg total) into feeding tube daily. °Patient taking differently: Place 10 mg into feeding tube daily. 02/13/20  Yes Kasa, Kurian, MD  °escitalopram (LEXAPRO) 20 MG tablet Place 20 mg into feeding tube daily.   Yes [provider]  °folic acid (FOLVITE) 1 MG tablet Place 1 tablet (1 mg total) into feeding tube daily. 02/13/20  Yes Kasa, Kurian, MD  °gabapentin (NEURONTIN) 600 MG tablet Place 600 mg into feeding tube 3 (three) times daily.   Yes [provider]  °lovastatin (MEVACOR) 20 MG tablet Place 20 mg into feeding tube daily.  06/13/20  Yes [provider]  °meclizine (ANTIVERT) 25 MG tablet Take 25 mg by mouth daily. 02/17/21  Yes [provider]  °melatonin 5   MG TABS Place 1 tablet (5 mg total) into feeding tube at bedtime. 02/13/20  Yes Kasa, Kurian, MD  °metFORMIN (GLUCOPHAGE) 1000 MG tablet Place 1,000 mg into feeding tube daily with breakfast. 06/10/20  Yes [provider]  °metoprolol tartrate (LOPRESSOR) 25 MG tablet Place 25 mg into feeding tube daily. 06/13/20  Yes [provider]  °Multiple Vitamins-Minerals (HAIR/SKIN/NAILS) TABS Take 1 tablet by mouth daily.   Yes [provider]  °NYAMYC powder Apply topically. 01/18/21  Yes [provider]  °pantoprazole (PROTONIX) 40 MG tablet Take 40 mg by mouth daily.   Yes [provider]  °rOPINIRole (REQUIP) 0.5 MG tablet Place 1 tablet (0.5 mg total) into feeding tube at bedtime. 02/13/20  Yes Kasa, Kurian, MD  °sodium chloride 0.9 % nebulizer solution sodium chloride 0.9 % for nebulization ° USE 5 ML BY NEBULIZTION AS NEEDED FOR WHEEZING 07/17/19  Yes [provider]  ° °Allergies  °Allergen Reactions  ° Shellfish Allergy Shortness Of Breath  °  respitatory distress   ° Sodium Sulfite Shortness Of Breath  °  Severe Congestion that creates inability to breath  ° Sulfa Antibiotics Shortness Of Breath and Other (See Comments)  °  respitatory distress  ° Codeine Other (See Comments)  °  Per patient "it kept her up all night"  ° Glucerna [Alimentum] Itching  ° Wound Dressing Adhesive Rash  ° °Review of Systems  °Unable to perform ROS ° °Physical Exam °Constitutional:   °   Comments: Eyes closed. Trach in place.   ° ° °Vital Signs: BP (!) 163/81    Pulse (!) 109    Temp 98 °F (36.7 °C) (Axillary)    Resp 20    Ht 5' 4.02" (1.626 m)    Wt 75.2 kg    SpO2 97%    BMI 28.44 kg/m²  °Pain Scale: 0-10 °  °Pain Score: 0-No pain ° ° °SpO2: SpO2: 97 % °O2 Device:SpO2: 97 % °O2 Flow Rate: .O2 Flow Rate (L/min): 8 L/min ° °IO:  Intake/output summary:  °Intake/Output Summary (Last 24 hours) at 03/01/2021 1623 °Last data filed at 03/01/2021 1400 °Gross per 24 hour  °Intake --  °Output 2025 ml  °Net -2025 ml  ° ° °LBM: Last BM Date: 03/01/21 °Baseline Weight: Weight: 73.5 kg °Most recent weight: Weight: 75.2 kg     ° ° °Time In: 4:00 °Time Out: 4:30 °Time Total: 30 min °Greater than 50%  of this time was spent counseling and coordinating care related to the above assessment and plan. ° °Signed by: ° , NP °  °Please contact Palliative Medicine Team phone at [phone] for questions and concerns.  °For individual provider: See Amion ° ° ° ° ° ° ° ° ° ° ° ° ° °

## 2021-03-01 NOTE — Progress Notes (Signed)
NAME:  Tyshay Adee, MRN:  [id number], DOB:  11/17/1940, LOS: 5 ADMISSION DATE:  02/20/2021, CONSULTATION DATE:  02/20/2021 REFERRING MD: Imagene Sheller, MD. 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   ED Course: Temperature 101.5 F, BP 126/65, HR 111, RR 27, SPO2 87% room air.  CT abdomen/pelvis with potential small focus subcutaneous fluid collection left of the umbilicus measuring 2.4 x 1.5 x 1.2 cm concerning for possible abscess.  General surgery consulted for further evaluation.  Patient now with also O2 requirement with possible pneumonia but no infiltrates on x-ray.  PCCM was consulted for assistance with vent management.  Patient was started on broad-spectrum antibiotics and admitted to the hospitalist service   02/27/21- patient remains on ventilator. S/p sugery evaluation of PEG tube. Resp cultures are stenotrophomonas.  She has pneumonia with parapneumonic effusion concerning for possible empyema. [name] at bedside reveals no pleural effusion bilaterally 02/29/20- patient weaned down on FIO2 today , will attemp SBT in am for liberation from MV. Diuresed well today >3L with improved resp status 03/01/21- patient exhibiting signs of severe derpression , she voiced not wanting to continue treatment medically, I have placed consult for palliative care and psychiatry  Pertinent  Medical History   Right Breast Cancer Tx'd with chemotherapy and XRT    Chronic respiratory failure ([place])    s/p tracheostomy in 2012;  on ventilatory support at hs  Diabetic peripheral neuropathy ([place]) 08/28/2016  GERD (gastroesophageal reflux disease)    Glaucoma    High cholesterol    History of colon polyps    Osteoarthritis    Personal history of chemotherapy 2002  BREAST CA  Personal history of radiation therapy 2002  BREAST CA  S/P percutaneous endoscopic gastrostomy (PEG) tube placement ([place])    T2DM (type 2 diabetes mellitus) ([place])    Tracheostomy dependent ([place]) 2012  Ventral hernia    Vocal cord paralysis    Significant Hospital Events: Including procedures, antibiotic start and stop dates in addition to other pertinent events   12/26: Admitted to stepdown with sespsis secondary to suspected abdominal abscess and possible early pneumonia. PCCM consulted for vent management 12/26: General surgery consulted 12/27: Remains on full vent support. 12/28: Remains agitated on the vent. CT head and chest obtained 12/30: Patient with episode of respiratory distress due to suspected aspiration. Bolus feed held.STAT CXR performed which revealed a worsening R pleural effusion. 12/31: Remains on the vent.  Cultures:  01/01 BCx: pending 01/01 UCx: pending 12/28 BCx: NGTD 12/26 BCx: NG final 12/30 RCx: NG final 12/26 MRSA PCR: not detected  12/26 respiratory panel: negative 12/26 SARS CoV-2: negative 12/26 influenza A/B: negative 12/26: Strep pneumo urinary antigen>>negative 12/26: Legionella urinary antigen>>negative   Antimicrobials:  12/26 vancomycin >> 12/29    01/01 >> 12/30 metronidazole >> 12/26 cefepime >> Interim History / Subjective:  -No acute events overnight. -Remains febrile, cultures/test obtained and pending  OBJECTIVE   Blood pressure (!) 172/85, pulse (!) 101, temperature 97.8 F (36.6 C), temperature source Oral, resp. rate 20, height 5' 4.02" (1.626 m), weight 75.2  kg, SpO2 95 %.    Vent Mode: SIMV/PC/PS FiO2 (%):  [35 %] 35 % Set Rate:  [5 bmp-10 bmp] 10 bmp PEEP:  [5 cmH20] 5  cmH20 Pressure Support:  [8 cmH20-10 cmH20] 10 cmH20 Plateau Pressure:  [15 cmH20-19 cmH20] 19 cmH20   Intake/Output Summary (Last 24 hours) at 03/01/2021 1058 Last data filed at 03/01/2021 0800 Gross per 24 hour  Intake 499.76 ml  Output 1400 ml  Net -900.24 ml    Filed Weights   02/25/21 0316 02/26/21 0402 02/27/21 0451  Weight: 73.4 kg 73.7 kg 75.2 kg   Examination: GENERAL:age-year-old patient lying in the bed on the vent via trach EYES: Pupils equal, round, reactive to light and accommodation. No scleral icterus. Extraocular muscles intact.  HEENT: Head atraumatic, normocephalic. Oropharynx and nasopharynx clear.  NECK:  Supple, no jugular venous distention. No thyroid enlargement, no tenderness.  LUNGS: Normal breath sounds bilaterally, no wheezing, rales,rhonchi or crepitation. No use of accessory muscles of respiration.  CARDIOVASCULAR: S1, S2 normal. No murmurs, rubs, or gallops.  ABDOMEN: Soft, nontender, nondistended. Bowel sounds present. No organomegaly or mass. PEG tube in place EXTREMITIES: No pedal edema, cyanosis, or clubbing.  NEUROLOGIC: Cranial nerves II through XII are intact. Muscle strength 5/5 in all extremities. Sensation intact. Gait not checked.  PSYCHIATRIC: The patient is alert and oriented x 1. Nods appropriately SKIN: No obvious rash, lesion, or ulcer.   Labs/imaging that I havepersonally reviewed  (right click and "Reselect all SmartList Selections" daily)    Labs   CBC: Recent Labs  Lab 02/25/21 0407 02/26/21 0414 02/27/21 0456 02/28/21 0524 03/01/21 0508  WBC 5.5 7.6 8.4 8.7 10.8*  HGB 13.5 13.1 11.0* 11.5* 13.0  HCT 43.5 43.0 35.0* 37.2 41.0  MCV 86.7 88.8 85.6 86.7 85.4  PLT 171 180 166 165 207     Basic Metabolic Panel: Recent Labs  Lab 02/24/21 0403 02/25/21 0407 02/26/21 0414 02/26/21 2016 02/26/21 2225 02/27/21 0456 02/28/21 0524 03/01/21 0508  NA 139 142 144  --   --  146* 144 139  K 3.3* 4.3 3.4*  --  3.3* 3.6 4.1 3.8   CL 101 101 102  --   --  106 107 98  CO2 29 32 32  --   --  _0 GLUCOSE 157* 231* 198*  --   --  193* 157* 218*  BUN 30* 28* 36*  --   --  32* 21 24*  CREATININE 0.87 0.85 1.14*  --   --  0.93 0.93 0.88  CALCIUM 7.9* 8.2* 8.5*  --   --  8.4* 8.8* 9.0  MG 1.8 2.4 2.0  --   --  1.9 2.2  --   PHOS 1.7* 3.0 1.6* 2.3*  --  2.4* 2.2*  --     GFR: Estimated Creatinine Clearance: 52.3 mL/min (by C-G formula based on SCr of 0.88 mg/dL). Recent Labs  Lab 02/22/21 1929 02/23/21 0449 02/23/21 0449 02/24/21 0403 02/25/21 0407 02/26/21 0414 02/27/21 0456 02/28/21 0524 03/01/21 0508  PROCALCITON  --  1.98  --  1.33 1.46  --   --   --   --   WBC  --  6.0   < >  --  5.5 7.6 8.4 8.7 10.8*  LATICACIDVEN 1.7 2.1*  --   --   --   --   --   --   --    < > = values in this interval not displayed.  Liver Function Tests: Recent Labs  Lab 02/26/21 0414 02/27/21 0456  AST 30 27  ALT 26 20  ALKPHOS 77 68  BILITOT 0.8 0.8  PROT 6.8 6.4*  ALBUMIN 2.7* 2.7*    No results for input(s): LIPASE, AMYLASE in the last 168 hours. No results for input(s): AMMONIA in the last 168 hours.  ABG    Component Value Date/Time   PHART 7.44 02/03/2020 1055   PCO2ART 53 (H) 02/03/2020 1055   PO2ART 92 02/03/2020 1055   HCO3 36.0 (H) 02/03/2020 1055   ACIDBASEDEF 0.9 01/19/2020 1024   O2SAT 97.4 02/03/2020 1055      Coagulation Profile: No results for input(s): INR, PROTIME in the last 168 hours.   Cardiac Enzymes: No results for input(s): CKTOTAL, CKMB, CKMBINDEX, TROPONINI in the last 168 hours.  HbA1C: Hgb A1c MFr Bld  Date/Time Value Ref Range Status  01/20/2020 07:19 AM 6.3 (H) 4.8 - 5.6 % Final    Comment:    (NOTE) Pre diabetes:          5.7%-6.4%  Diabetes:              >6.4%  Glycemic control for   <7.0% adults with diabetes   03/21/2019 03:24 AM 6.6 (H) 4.8 - 5.6 % Final    Comment:    (NOTE) Pre diabetes:          5.7%-6.4% Diabetes:              >6.4% Glycemic  control for   <7.0% adults with diabetes     CBG: Recent Labs  Lab 02/28/21 1658 02/28/21 1942 03/01/21 0009 03/01/21 0317 03/01/21 0758  GLUCAP 161* 167* 207* 209* 215*     Allergies Allergies  Allergen Reactions   Shellfish Allergy Shortness Of Breath    respitatory distress    Sodium Sulfite Shortness Of Breath    Severe Congestion that creates inability to breath   Sulfa Antibiotics Shortness Of Breath and Other (See Comments)    respitatory distress   Codeine Other (See Comments)    Per patient "it kept her up all night"   Glucerna [Alimentum] Itching   Wound Dressing Adhesive Rash      Home Medications  Prior to Admission medications   Medication Sig Start Date End Date Taking? Authorizing Provider  acetaminophen (TYLENOL) 500 MG tablet Take 1,000 mg by mouth in the morning and at bedtime.   Yes [provider]  busPIRone (BUSPAR) 15 MG tablet Place 15 mg into feeding tube 2 (two) times daily. 09/26/20  Yes [provider]  carboxymethylcellulose (REFRESH PLUS) 0.5 % SOLN Place 1 drop into both eyes 3 (three) times daily as needed (dry eyes).   Yes [provider]  denosumab (PROLIA) 60 MG/ML SOSY injection Inject 60 mg into the skin every 6 (six) months.   Yes [provider]  EPINEPHrine 0.3 mg/0.3 mL IJ SOAJ injection epinephrine 0.3 mg/0.3 mL injection, auto-injector  Inject 0.3 mLs (0.3 mg dose) into the muscle once as needed for Anaphylaxis for up to 1 dose. 09/10/18  Yes [provider]  escitalopram (LEXAPRO) 10 MG tablet Place 3 tablets (30 mg total) into feeding tube daily. Patient taking differently: Place 10 mg into feeding tube daily. 02/13/20  Yes Kasa, Maretta Bees, MD  escitalopram (LEXAPRO) 20 MG tablet Place 20 mg into feeding tube daily.   Yes [provider]  folic acid (FOLVITE) 1 MG tablet Place 1 tablet (1 mg total) into feeding tube  daily. 02/13/20  Yes Flora Lipps, MD  gabapentin (NEURONTIN) 600  MG tablet Place 600 mg into feeding tube 3 (three) times daily.   Yes [provider]  lovastatin (MEVACOR) 20 MG tablet Place 20 mg into feeding tube daily. 06/13/20  Yes [provider]  meclizine (ANTIVERT) 25 MG tablet Take 25 mg by mouth daily. 02/17/21  Yes [provider]  melatonin 5 MG TABS Place 1 tablet (5 mg total) into feeding tube at bedtime. 02/13/20  Yes Flora Lipps, MD  metFORMIN (GLUCOPHAGE) 1000 MG tablet Place 1,000 mg into feeding tube daily with breakfast. 06/10/20  Yes [provider]  metoprolol tartrate (LOPRESSOR) 25 MG tablet Place 25 mg into feeding tube daily. 06/13/20  Yes [provider]  Multiple Vitamins-Minerals (HAIR/SKIN/NAILS) TABS Take 1 tablet by mouth daily.   Yes [provider]  [facility] powder Apply topically. 01/18/21  Yes [provider]  pantoprazole (PROTONIX) 40 MG tablet Take 40 mg by mouth daily.   Yes [provider]  rOPINIRole (REQUIP) 0.5 MG tablet Place 1 tablet (0.5 mg total) into feeding tube at bedtime. 02/13/20  Yes Kasa, Maretta Bees, MD  sodium chloride 0.9 % nebulizer solution sodium chloride 0.9 % for nebulization  USE 5 ML BY NEBULIZTION AS NEEDED FOR WHEEZING 07/17/19  Yes [provider]  Scheduled Meds:  busPIRone  15 mg Per Tube BID   chlorhexidine gluconate (MEDLINE KIT)  15 mL Mouth Rinse BID   Chlorhexidine Gluconate Cloth  6 each Topical Q0600   escitalopram  30 mg Per Tube Daily   feeding supplement (KATE FARMS STANDARD 1.4)  1,440 mL Per Tube Daily   folic acid  1 mg Per Tube Daily   free water  50 mL Per Tube Q4H   furosemide  40 mg Intravenous BID   insulin aspart  0-15 Units Subcutaneous Q4H   insulin detemir  8 Units Subcutaneous BID   ipratropium-albuterol  3 mL Nebulization TID   loratadine  10 mg Per Tube Daily   mouth rinse  15 mL Mouth Rinse 10 times per day   melatonin  5 mg Per Tube QHS   metoprolol tartrate  25 mg Per Tube Daily    minocycline  200 mg Per Tube BID   multivitamin  15 mL Per Tube Daily   pantoprazole sodium  40 mg Per Tube Daily   pravastatin  20 mg Per Tube q1800   rOPINIRole  0.5 mg Per Tube QHS   Continuous Infusions:  albumin human 12.5 g (02/28/21 1135)   PRN Meds:.acetaminophen, acetaminophen, docusate, ipratropium-albuterol, metoprolol tartrate   ASSESSMENT & PLAN  Acute on Chronic Hypoxic Respiratory Failure secondary to suspected aspiration in the setting of 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 -continue ventilator support & lung protective strategies -Wean PEEP & FiO2 as tolerated, maintain SpO2 > 90% -Head of bed elevated 30 degrees -Intermittent chest x-ray & ABG PRN -Ensure adequate pulmonary hygiene  -bronchodilators PRN -Lopressor  PRN from 2.5 to 5 mg Q 6 PRN -Xanax TID added back as PRN for anxiety  Sepsis due to suspected Abdominal Abscess & recurrent Aspiration Pneumonia Met sepsis criteria on admission Follow up Chest CT with right pleural effusion and bilateral lower lobe consolidation concerning for aspiration pneumonia -F/u cultures, trend lactic/ PCT -Monitor WBC/ fever curve -IV antibiotics: cefepime & vancomycin&Flagyl -IVF hydration as needed -Pressors for MAP goal >65 -Strict I/O's   Possible subcutaneous periumbilical abscess -CT Abdomen/Pelvis concerning for possible  abscess  -Per surgery restart continuous TF at low rate,if aspiration continues will obtain GI series with oral contrast to the gastrostomy can be helpful.  If gastroparesis is not the cause of her reflux and aspiration another option is to consider exchanging the gastrostomy tube to a gastrojejunostomy tube under fluoroscopic guidance. -Consider Reglan for GI motility -General surgery consult appreciated    Diabetes mellitus      -CBG's -SSI and Lantus -Follow ICU Hypo/hyperglycemia protocol    Nutritional Status/Diet -Will start trickle TF per PEG.  General Surgery ok to use -Dietician following, TF's as tolerated -Constipation protocol as indicated    Electrolytes -follow labs as needed -replace as needed -pharmacy consultation and following    Best practice (right click and "Reselect all SmartList Selections" daily)  Diet:  Tube Feed  Pain/Anxiety/Delirium protocol (if indicated): Yes (RASS goal 0) VAP protocol (if indicated): Yes DVT prophylaxis: Subcutaneous Heparin GI prophylaxis: PPI Glucose control:  SSI Yes Central venous access:  N/A Arterial line:  N/A Foley:  Yes, and it is still needed Mobility:  bed rest  PT consulted: N/A Last date of multidisciplinary goals of care discussion [02/26/21] Code Status:  full code Disposition: ICU  Critical care provider statement:   Total critical care time: 33 minutes   Performed by: Lanney Gins MD   Critical care time was exclusive of separately billable procedures and treating other patients.   Critical care was necessary to treat or prevent imminent or life-threatening deterioration.   Critical care was time spent personally by me on the following activities: development of treatment plan with patient and/or surrogate as well as nursing, discussions with consultants, evaluation of patient's response to treatment, examination of patient, obtaining history from patient or surrogate, ordering and performing treatments and interventions, ordering and review of laboratory studies, ordering and review of radiographic studies, pulse oximetry and re-evaluation of patient's condition.    Ottie Glazier, M.D.  Pulmonary & Critical Care Medicine

## 2021-03-02 ENCOUNTER — Inpatient Hospital Stay: Payer: Medicare Other

## 2021-03-02 DIAGNOSIS — F0631 Mood disorder due to known physiological condition with depressive features: Secondary | ICD-10-CM | POA: Diagnosis not present

## 2021-03-02 DIAGNOSIS — A419 Sepsis, unspecified organism: Secondary | ICD-10-CM | POA: Diagnosis not present

## 2021-03-02 DIAGNOSIS — Z7189 Other specified counseling: Secondary | ICD-10-CM | POA: Diagnosis not present

## 2021-03-02 LAB — BASIC METABOLIC PANEL
Anion gap: 10 (ref 5–15)
BUN: 30 mg/dL — ABNORMAL HIGH (ref 8–23)
CO2: 35 mmol/L — ABNORMAL HIGH (ref 22–32)
Calcium: 9.2 mg/dL (ref 8.9–10.3)
Chloride: 97 mmol/L — ABNORMAL LOW (ref 98–111)
Creatinine, Ser: 0.93 mg/dL (ref 0.44–1.00)
GFR, Estimated: >60 mL/min (ref >60)
Glucose, Bld: 212 mg/dL — ABNORMAL HIGH (ref 70–99)
Potassium: 4.2 mmol/L (ref 3.5–5.1)
Sodium: 142 mmol/L (ref 135–145)

## 2021-03-02 LAB — CBC
HCT: 41.4 % (ref 36.0–46.0)
Hemoglobin: 12.9 g/dL (ref 12.0–15.0)
MCH: 27.6 pg (ref 26.0–34.0)
MCHC: 31.2 g/dL (ref 30.0–36.0)
MCV: 88.5 fL (ref 80.0–100.0)
Platelets: 280 10*3/uL (ref 150–400)
RBC: 4.68 MIL/uL (ref 3.87–5.11)
RDW: 13.7 % (ref 11.5–15.5)
WBC: 16 10*3/uL — ABNORMAL HIGH (ref 4.0–10.5)
nRBC: 0 % (ref 0.0–0.2)

## 2021-03-02 LAB — GLUCOSE, CAPILLARY
Glucose-Capillary: 174 mg/dL — ABNORMAL HIGH (ref 70–99)
Glucose-Capillary: 226 mg/dL — ABNORMAL HIGH (ref 70–99)
Glucose-Capillary: 226 mg/dL — ABNORMAL HIGH (ref 70–99)
Glucose-Capillary: 233 mg/dL — ABNORMAL HIGH (ref 70–99)
Glucose-Capillary: 242 mg/dL — ABNORMAL HIGH (ref 70–99)
Glucose-Capillary: 246 mg/dL — ABNORMAL HIGH (ref 70–99)

## 2021-03-02 MED ORDER — PIPERACILLIN-TAZOBACTAM 3.375 G IVPB: 3.3750 g | Freq: Three times a day (TID) | INTRAVENOUS | Status: DC

## 2021-03-02 MED ORDER — LEVOFLOXACIN IN D5W 750 MG/150ML IV SOLN
750.0000 mg | INTRAVENOUS | Status: DC
  Administered 2021-03-02: 750 mg via INTRAVENOUS
  Filled 2021-03-02 (×3): qty 150

## 2021-03-02 NOTE — Consult Note (Signed)
°  Attempted consult. Patient is sleeping. Trach/O2 in place. Will try again later.

## 2021-03-02 NOTE — Progress Notes (Signed)
PT Cancellation Note  Patient Details Name: Angela David MRN: [id number] DOB: 08-04-1940   Cancelled Treatment:     PT attempt. Pt is unable to stay awake long enough to participate. Resting HR 117 bpm. PRVC Peep 5 Fio2 35% currently    Willette Pa 03/02/2021, 4:08 PM

## 2021-03-02 NOTE — Progress Notes (Signed)
NAME:  Angela David, MRN:  [id number], DOB:  05-27-1940, LOS: 5 ADMISSION DATE:  02/20/2021, CONSULTATION DATE:  02/20/2021 REFERRING MD: Imagene Sheller, MD. 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   ED Course: Temperature 101.5 F, BP 126/65, HR 111, RR 27, SPO2 87% room air.  CT abdomen/pelvis with potential small focus subcutaneous fluid collection left of the umbilicus measuring 2.4 x 1.5 x 1.2 cm concerning for possible abscess.  General surgery consulted for further evaluation.  Patient now with also O2 requirement with possible pneumonia but no infiltrates on x-ray.  PCCM was consulted for assistance with vent management.  Patient was started on broad-spectrum antibiotics and admitted to the hospitalist service   02/27/21- patient remains on ventilator. S/p sugery evaluation of PEG tube. Resp cultures are stenotrophomonas.  She has pneumonia with parapneumonic effusion concerning for possible empyema. [name] at bedside reveals no pleural effusion bilaterally 02/29/20- patient weaned down on FIO2 today , will attemp SBT in am for liberation from MV. Diuresed well today >3L with improved resp status 03/01/21- patient exhibiting signs of severe derpression , she voiced not wanting to continue treatment medically, I have placed consult for palliative care and psychiatry. 03/02/20-  Patient still with what appears to be deprssion. I met with husband today and patient was evaluated by psychiatry today.   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 PCCM consulted for vent management 12/26: General surgery consulted 12/27: Remains on full vent support. 12/28: Remains agitated on the vent. CT head and chest obtained 12/30: Patient with episode of respiratory distress due to suspected aspiration. Bolus feed held.STAT CXR performed which revealed a worsening R pleural effusion. 12/31: Remains on the vent.  Cultures:  01/01 BCx: pending 01/01 UCx: pending 12/28 BCx: NGTD 12/26 BCx: NG final 12/30 RCx: NG final 12/26 MRSA PCR: not detected  12/26 respiratory panel: negative 12/26 SARS CoV-2: negative 12/26 influenza A/B: negative 12/26: Strep pneumo urinary antigen>>negative 12/26: Legionella urinary antigen>>negative   Antimicrobials:  12/26 vancomycin >> 12/29    01/01 >> 12/30 metronidazole >> 12/26 cefepime >> Interim History / Subjective:  -No acute events overnight. -Remains febrile, cultures/test obtained and pending  OBJECTIVE   Blood pressure  112/74, pulse (!) 123, temperature (!) 100.9 F (38.3 C), temperature source Axillary, resp. rate 16, height 5' 4.02"  (1.626 m), weight 73.2 kg, SpO2 96 %.    Vent Mode: PRVC FiO2 (%):  [35 %] 35 % Set Rate:  [14 bmp] 14 bmp Vt Set:  [440 mL] 440 mL PEEP:  [5 cmH20] 5 cmH20 Plateau Pressure:  [18 cmH20-24 cmH20] 18 cmH20   Intake/Output Summary (Last 24 hours) at 03/02/2021 1033 Last data filed at 03/02/2021 1000 Gross per 24 hour  Intake 220 ml  Output 1900 ml  Net -1680 ml    Filed Weights   02/26/21 0402 02/27/21 0451 03/02/21 0426  Weight: 73.7 kg 75.2 kg 73.2 kg   Examination: GENERAL:age-year-old patient lying in the bed on the vent via trach EYES: Pupils equal, round, reactive to light and accommodation. No scleral icterus. Extraocular muscles intact.  HEENT: Head atraumatic, normocephalic. Oropharynx and nasopharynx clear.  NECK:  Supple, no jugular venous distention. No thyroid enlargement, no tenderness.  LUNGS: Normal breath sounds bilaterally, no wheezing, rales,rhonchi or crepitation. No use of accessory muscles of respiration.  CARDIOVASCULAR: S1, S2 normal. No murmurs, rubs, or gallops.  ABDOMEN: Soft, nontender, nondistended. Bowel sounds present. No organomegaly or mass. PEG tube in place EXTREMITIES: No pedal edema, cyanosis, or clubbing.  NEUROLOGIC: Cranial nerves II through XII are intact. Muscle strength 5/5 in all extremities. Sensation intact. Gait not checked.  PSYCHIATRIC: The patient is alert and oriented x 1. Nods appropriately SKIN: No obvious rash, lesion, or ulcer.   Labs/imaging that I havepersonally reviewed  (right click and "Reselect all SmartList Selections" daily)    Labs   CBC: Recent Labs  Lab 02/26/21 0414 02/27/21 0456 02/28/21 0524 03/01/21 0508 03/02/21 0548  WBC 7.6 8.4 8.7 10.8* 16.0*  HGB 13.1 11.0* 11.5* 13.0 12.9  HCT 43.0 35.0* 37.2 41.0 41.4  MCV 88.8 85.6 86.7 85.4 88.5  PLT 180 166 165 207 280     Basic Metabolic Panel: Recent Labs  Lab 02/24/21 0403 02/25/21 0407 02/26/21 0414 02/26/21 2016 02/26/21 2225 02/27/21 0456  02/28/21 0524 03/01/21 0508 03/02/21 0548  NA 139 142 144  --   --  146* 144 139 142  K 3.3* 4.3 3.4*  --  3.3* 3.6 4.1 3.8 4.2  CL 101 101 102  --   --  106 107 98 97*  CO2 29 32 32  --   --  '29 28 31 ' 35*  GLUCOSE 157* 231* 198*  --   --  193* 157* 218* 212*  BUN 30* 28* 36*  --   --  32* 21 24* 30*  CREATININE 0.87 0.85 1.14*  --   --  0.93 0.93 0.88 0.93  CALCIUM 7.9* 8.2* 8.5*  --   --  8.4* 8.8* 9.0 9.2  MG 1.8 2.4 2.0  --   --  1.9 2.2  --   --   PHOS 1.7* 3.0 1.6* 2.3*  --  2.4* 2.2*  --   --     GFR: Estimated Creatinine Clearance: 48.9 mL/min (by C-G formula based on SCr of 0.93 mg/dL). Recent Labs  Lab 02/24/21 0403 02/25/21 0407 02/26/21 0414 02/27/21 0456 02/28/21 0524 03/01/21 0508 03/02/21 0548  PROCALCITON 1.33 1.46  --   --   --   --   --   WBC  --  5.5   < > 8.4 8.7 10.8* 16.0*   < > = values in this interval not displayed.  Liver Function Tests: Recent Labs  Lab 02/26/21 0414 02/27/21 0456  AST 30 27  ALT 26 20  ALKPHOS 77 68  BILITOT 0.8 0.8  PROT 6.8 6.4*  ALBUMIN 2.7* 2.7*    No results for input(s): LIPASE, AMYLASE in the last 168 hours. No results for input(s): AMMONIA in the last 168 hours.  ABG    Component Value Date/Time   PHART 7.44 02/03/2020 1055   PCO2ART 53 (H) 02/03/2020 1055   PO2ART 92 02/03/2020 1055   HCO3 36.0 (H) 02/03/2020 1055   ACIDBASEDEF 0.9 01/19/2020 1024   O2SAT 97.4 02/03/2020 1055      Coagulation Profile: No results for input(s): INR, PROTIME in the last 168 hours.   Cardiac Enzymes: No results for input(s): CKTOTAL, CKMB, CKMBINDEX, TROPONINI in the last 168 hours.  HbA1C: Hgb A1c MFr Bld  Date/Time Value Ref Range Status  01/20/2020 07:19 AM 6.3 (H) 4.8 - 5.6 % Final    Comment:    (NOTE) Pre diabetes:          5.7%-6.4%  Diabetes:              >6.4%  Glycemic control for   <7.0% adults with diabetes   03/21/2019 03:24 AM 6.6 (H) 4.8 - 5.6 % Final    Comment:    (NOTE) Pre  diabetes:          5.7%-6.4% Diabetes:              >6.4% Glycemic control for   <7.0% adults with diabetes     CBG: Recent Labs  Lab 03/01/21 1614 03/01/21 1928 03/02/21 0004 03/02/21 0335 03/02/21 0742  GLUCAP 331* 261* 242* 174* 233*     Allergies Allergies  Allergen Reactions   Shellfish Allergy Shortness Of Breath    respitatory distress    Sodium Sulfite Shortness Of Breath    Severe Congestion that creates inability to breath   Sulfa Antibiotics Shortness Of Breath and Other (See Comments)    respitatory distress   Codeine Other (See Comments)    Per patient "it kept her up all night"   Glucerna [Alimentum] Itching   Wound Dressing Adhesive Rash      Home Medications  Prior to Admission medications   Medication Sig Start Date End Date Taking? Authorizing Provider  acetaminophen (TYLENOL) 500 MG tablet Take 1,000 mg by mouth in the morning and at bedtime.   Yes [provider]  busPIRone (BUSPAR) 15 MG tablet Place 15 mg into feeding tube 2 (two) times daily. 09/26/20  Yes [provider]  carboxymethylcellulose (REFRESH PLUS) 0.5 % SOLN Place 1 drop into both eyes 3 (three) times daily as needed (dry eyes).   Yes [provider]  denosumab (PROLIA) 60 MG/ML SOSY injection Inject 60 mg into the skin every 6 (six) months.   Yes [provider]  EPINEPHrine 0.3 mg/0.3 mL IJ SOAJ injection epinephrine 0.3 mg/0.3 mL injection, auto-injector  Inject 0.3 mLs (0.3 mg dose) into the muscle once as needed for Anaphylaxis for up to 1 dose. 09/10/18  Yes [provider]  escitalopram (LEXAPRO) 10 MG tablet Place 3 tablets (30 mg total) into feeding tube daily. Patient taking differently: Place 10 mg into feeding tube daily. 02/13/20  Yes Kasa, Maretta Bees, MD  escitalopram (LEXAPRO) 20 MG tablet Place 20 mg into feeding tube daily.   Yes [provider]  folic acid (FOLVITE) 1 MG tablet Place 1 tablet (1 mg total) into feeding  tube daily. 02/13/20  Yes Flora Lipps, MD  gabapentin (NEURONTIN) 600 MG tablet Place 600 mg into feeding tube 3 (three) times daily.   Yes [provider]  lovastatin (MEVACOR) 20 MG tablet Place 20 mg into feeding tube daily. 06/13/20  Yes [provider]  meclizine (ANTIVERT) 25 MG tablet Take 25 mg by mouth daily. 02/17/21  Yes [provider]  melatonin 5 MG TABS Place 1 tablet (5 mg total) into feeding tube at bedtime. 02/13/20  Yes Flora Lipps, MD  metFORMIN (GLUCOPHAGE) 1000 MG tablet Place 1,000 mg into feeding tube daily with breakfast. 06/10/20  Yes [provider]  metoprolol tartrate (LOPRESSOR) 25 MG tablet Place 25 mg into feeding tube daily. 06/13/20  Yes [provider]  Multiple Vitamins-Minerals (HAIR/SKIN/NAILS) TABS Take 1 tablet by mouth daily.   Yes [provider]  [facility] powder Apply topically. 01/18/21  Yes [provider]  pantoprazole (PROTONIX) 40 MG tablet Take 40 mg by mouth daily.   Yes [provider]  rOPINIRole (REQUIP) 0.5 MG tablet Place 1 tablet (0.5 mg total) into feeding tube at bedtime. 02/13/20  Yes Kasa, Maretta Bees, MD  sodium chloride 0.9 % nebulizer solution sodium chloride 0.9 % for nebulization  USE 5 ML BY NEBULIZTION AS NEEDED FOR WHEEZING 07/17/19  Yes [provider]  Scheduled Meds:  busPIRone  15 mg Per Tube BID   chlorhexidine gluconate (MEDLINE KIT)  15 mL Mouth Rinse BID   Chlorhexidine Gluconate Cloth  6 each Topical Q0600   enoxaparin (LOVENOX) injection  40 mg Subcutaneous QHS   escitalopram  30 mg Per Tube Daily   feeding supplement ([place] STANDARD 1.4)  1,440 mL Per Tube Daily   folic acid  1 mg Per Tube Daily   free water  50 mL Per Tube Q4H   furosemide  40 mg Intravenous BID   insulin aspart  0-15 Units Subcutaneous Q4H   insulin detemir  8 Units Subcutaneous BID   ipratropium-albuterol  3 mL Nebulization TID   loratadine  10 mg Per Tube Daily    mouth rinse  15 mL Mouth Rinse 10 times per day   melatonin  5 mg Per Tube QHS   metoprolol tartrate  25 mg Per Tube Daily   minocycline  200 mg Per Tube BID   multivitamin  15 mL Per Tube Daily   pantoprazole sodium  40 mg Per Tube Daily   pravastatin  20 mg Per Tube q1800   rOPINIRole  0.5 mg Per Tube QHS   Continuous Infusions:  albumin human Stopped (03/01/21 1259)   PRN Meds:.acetaminophen, acetaminophen, docusate, ipratropium-albuterol, metoprolol tartrate   ASSESSMENT & PLAN  Acute on Chronic Hypoxic Respiratory Failure secondary to suspected aspiration in the setting of 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 -continue ventilator support & lung protective strategies -Wean PEEP & FiO2 as tolerated, maintain SpO2 > 90% -Head of bed elevated 30 degrees -Intermittent chest x-ray & ABG PRN -Ensure adequate pulmonary hygiene  -bronchodilators PRN -Lopressor  PRN from 2.5 to 5 mg Q 6 PRN -Xanax TID added back as PRN for anxiety  Sepsis due to suspected Abdominal Abscess & recurrent Aspiration Pneumonia Met sepsis criteria on admission Follow up Chest CT with right pleural effusion and bilateral lower lobe consolidation concerning for aspiration pneumonia -F/u cultures, trend lactic/ PCT -Monitor WBC/ fever curve -IV antibiotics: cefepime & vancomycin&Flagyl -IVF hydration as needed -Pressors for MAP goal >65 -Strict I/O's  Possible subcutaneous periumbilical abscess -CT Abdomen/Pelvis concerning for possible abscess  -Per surgery restart continuous TF at low rate,if aspiration continues will obtain GI series with oral contrast to the gastrostomy can be helpful.  If gastroparesis is not the cause of her reflux and aspiration another option is to consider exchanging the gastrostomy tube to a gastrojejunostomy tube under fluoroscopic guidance. -Consider Reglan for GI motility -General surgery consult appreciated    Diabetes mellitus       -CBG's -SSI and Lantus -Follow ICU Hypo/hyperglycemia protocol    Nutritional Status/Diet -Will start trickle TF per PEG. General Surgery ok to use -Dietician following, TF's as tolerated -Constipation protocol as indicated    Electrolytes -follow labs as needed -replace as needed -pharmacy consultation and following    Best practice (right click and "Reselect all SmartList Selections" daily)  Diet:  Tube Feed  Pain/Anxiety/Delirium protocol (if indicated): Yes (RASS goal 0) VAP protocol (if indicated): Yes DVT prophylaxis: Subcutaneous Heparin GI prophylaxis: PPI Glucose control:  SSI Yes Central venous access:  N/A Arterial line:  N/A Foley:  Yes, and it is still needed Mobility:  bed rest  PT consulted: N/A Last date of multidisciplinary goals of care discussion [02/26/21] Code Status:  full code Disposition: ICU  Critical care provider statement:   Total critical care time: 33 minutes   Performed by: Lanney Gins MD   Critical care time was exclusive of separately billable procedures and treating other patients.   Critical care was necessary to treat or prevent imminent or life-threatening deterioration.   Critical care was time spent personally by me on the following activities: development of treatment plan with patient and/or surrogate as well as nursing, discussions with consultants, evaluation of patient's response to treatment, examination of patient, obtaining history from patient or surrogate, ordering and performing treatments and interventions, ordering and review of laboratory studies, ordering and review of radiographic studies, pulse oximetry and re-evaluation of patient's condition.    Ottie Glazier, M.D.  Pulmonary & Critical Care Medicine

## 2021-03-02 NOTE — Consult Note (Addendum)
[facility] Face-to-Face Psychiatry Consult   Reason for Consult:  depression Referring Physician:  Lanney Gins Patient Identification: Angela David MRN:  [id number] Principal Diagnosis: Depression due to physical illness Diagnosis:  Principal Problem:   Depression due to physical illness Active Problems:   Sepsis ([place])   Acute pulmonary edema ([place])   Somnolence   Total Time spent with patient: 30 minutes  Subjective:   Angela David is a 81 y.o. female patient admitted with sepsis . Consult for depression.  HPI: Patient in ICU.  Patient seen face-to-face. She is somewhat alert with opening eyes on and off and will nod "yes" or "no." Writer asked if she was depressed, she nods "no." Asked if she wanted live, patient nods, "yes." She responds "yes" to question of is she feeling better. Patient closes eyes and no Writer spoke to patient's bedside RN, who states that she has had patient in previous admissions recently and she has not been this somnolent.      Collateral from husband Angela David, [phone]): Mentally, She is "fine." She took buspar and Lexapro before admission and he doesn't know what might be making her not as alert, although he states she had her eyes open more today than yesterday. He states emphatically that "she wants to live." He wants to make it clear that he doesn't want anyone thinking that either one of them are in a hurry for "the grim reaper" to come for patient.     Past Psychiatric History: husband states she has been prescribed antidepressant and buspar prior to this hospitalization and has taken for awhile.   Risk to Self:   Risk to Others:   Prior Inpatient Therapy:   Prior Outpatient Therapy:    Past Medical History:  Past Medical History:  Diagnosis Date   Breast cancer, right (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   Ventral hernia    Vocal cord paralysis 2012    Past Surgical History:  Procedure Laterality Date   ABDOMINAL HYSTERECTOMY     BREAST BIOPSY Right 2008   neg   BREAST BIOPSY Right 06/30/2020   "Q" clip-path pending   BREAST EXCISIONAL BIOPSY Right 2002   positive   BREAST LUMPECTOMY Right 2002   chemo and radiation   BREAST LUMPECTOMY WITH RADIOFREQUENCY TAG IDENTIFICATION Right 10/18/2020   Procedure: BREAST LUMPECTOMY WITH RADIOFREQUENCY TAG IDENTIFICATION;  Surgeon: Jules Husbands, MD;  Location: [facility] ORS;  Service: General;  Laterality: Right;   BREAST SURGERY Right 2002   LUMPECTOMY, radiation, chemotherapy   CATARACT EXTRACTION, BILATERAL     CHOLECYSTECTOMY N/A 12/10/2017   Procedure: LAPAROSCOPIC CHOLECYSTECTOMY;  Surgeon: Benjamine Sprague, DO;  Location: [facility] ORS;  Service: General;  Laterality: N/A;   COLON RESECTION Right 11/06/2018   Procedure: HAND ASSISTED LAPAROSCOPIC COLON RESECTION, RIGHT;  Surgeon: Jules Husbands, MD;  Location: [facility] ORS;  Service: General;  Laterality: Right;  COLONOSCOPY  2009,12/30/2013   COLONOSCOPY WITH PROPOFOL N/A 04/16/2018   Tubulovillous polyp proximal transverse colon, large sessile polyp, proximal transverse colon;  Surgeon: Robert Bellow, MD;  Location: [facility] ENDOSCOPY;  Service: Endoscopy;  Laterality: N/A;   ESOPHAGOGASTRODUODENOSCOPY (EGD) WITH PROPOFOL N/A 10/14/2018   Procedure: ESOPHAGOGASTRODUODENOSCOPY (EGD) WITH PROPOFOL;  Surgeon: Lucilla Lame, MD;  Location: [facility] ENDOSCOPY;  Service: Endoscopy;  Laterality: N/A;   TRACHEAL SURGERY   2013   TRACHEOSTOMY N/A    Dr Kathyrn Sheriff   XI ROBOTIC ASSISTED VENTRAL HERNIA N/A 10/18/2020   Procedure: XI ROBOTIC ASSISTED VENTRAL HERNIA;  Surgeon: Jules Husbands, MD;  Location: [facility] ORS;  Service: General;  Laterality: N/A;   Family History:  Family History  Problem Relation Age of Onset   Cancer Father        bone cancer   Cancer Sister        lymphoma   Cancer Brother    Breast cancer Neg Hx    Family Psychiatric  History: unknown Social History:  Social History   Substance and Sexual Activity  Alcohol Use No     Social History   Substance and Sexual Activity  Drug Use No    Social History   Socioeconomic History   Marital status: Married    Spouse name: Angela David   Number of children: Not on file   Years of education: Not on file   Highest education level: Not on file  Occupational History   Not on file  Tobacco Use   Smoking status: Never   Smokeless tobacco: Never  Vaping Use   Vaping Use: Never used  Substance and Sexual Activity   Alcohol use: No   Drug use: No   Sexual activity: Not on file  Other Topics Concern   Not on file  Social History Narrative   Lives with husband   Right Handed   Drinks 2-3 cups caffeine daily   Social Determinants of Health   Financial Resource Strain: Not on file  Food Insecurity: Not on file  Transportation Needs: Not on file  Physical Activity: Not on file  Stress: Not on file  Social Connections: Not on file   Additional Social History:    Allergies:   Allergies  Allergen Reactions   Shellfish Allergy Shortness Of Breath    respitatory distress    Sodium Sulfite Shortness Of Breath    Severe Congestion that creates inability to breath   Sulfa Antibiotics Shortness Of Breath and Other (See Comments)    respitatory distress   Codeine Other (See Comments)    Per patient "it kept her up all night"   Glucerna [Alimentum] Itching   Wound Dressing Adhesive Rash    Labs:  Results for orders placed or  performed during the hospital encounter of 02/20/21 (from the past 48 hour(s))  Glucose, capillary     Status: Abnormal   Collection Time: 02/28/21  7:42 PM  Result Value Ref Range   Glucose-Capillary 167 (H) 70 - 99 mg/dL    Comment: Glucose reference range applies only to samples taken after fasting for at least 8 hours.   Comment 1 Notify RN    Comment 2 Document in Chart   Glucose, capillary     Status: Abnormal   Collection Time: 03/01/21 12:09 AM  Result Value Ref Range   Glucose-Capillary 207 (H) 70 - 99 mg/dL    Comment: Glucose reference range applies only to samples taken after fasting for at least 8 hours.  Comment 1 Notify RN    Comment 2 Document in Chart   Glucose, capillary     Status: Abnormal   Collection Time: 03/01/21  3:17 AM  Result Value Ref Range   Glucose-Capillary 209 (H) 70 - 99 mg/dL    Comment: Glucose reference range applies only to samples taken after fasting for at least 8 hours.   Comment 1 Notify RN    Comment 2 Document in Chart   CBC     Status: Abnormal   Collection Time: 03/01/21  5:08 AM  Result Value Ref Range   WBC 10.8 (H) 4.0 - 10.5 K/uL   RBC 4.80 3.87 - 5.11 MIL/uL   Hemoglobin 13.0 12.0 - 15.0 g/dL   HCT 41.0 36.0 - 46.0 %   MCV 85.4 80.0 - 100.0 fL   MCH 27.1 26.0 - 34.0 pg   MCHC 31.7 30.0 - 36.0 g/dL   RDW 13.7 11.5 - 15.5 %   Platelets 207 150 - 400 K/uL   nRBC 0.2 0.0 - 0.2 %    Comment: Performed at [facility], [address], [place], [place] [address]  Basic metabolic panel     Status: Abnormal   Collection Time: 03/01/21  5:08 AM  Result Value Ref Range   Sodium 139 135 - 145 mmol/L   Potassium 3.8 3.5 - 5.1 mmol/L   Chloride 98 98 - 111 mmol/L   CO2 31 22 - 32 mmol/L   Glucose, Bld 218 (H) 70 - 99 mg/dL    Comment: Glucose reference range applies only to samples taken after fasting for at least 8 hours.   BUN 24 (H) 8 - 23 mg/dL   Creatinine, Ser 0.88 0.44 - 1.00 mg/dL   Calcium 9.0 8.9 - 10.3 mg/dL    GFR, Estimated >60 >60 mL/min    Comment: (NOTE) Calculated using the CKD-EPI Creatinine Equation (2021)    Anion gap 10 5 - 15    Comment: Performed at [facility], [address] [place],  [address]  Glucose, capillary     Status: Abnormal   Collection Time: 03/01/21  7:58 AM  Result Value Ref Range   Glucose-Capillary 215 (H) 70 - 99 mg/dL    Comment: Glucose reference range applies only to samples taken after fasting for at least 8 hours.  Glucose, capillary     Status: Abnormal   Collection Time: 03/01/21 11:57 AM  Result Value Ref Range   Glucose-Capillary 317 (H) 70 - 99 mg/dL    Comment: Glucose reference range applies only to samples taken after fasting for at least 8 hours.  Glucose, capillary     Status: Abnormal   Collection Time: 03/01/21  4:14 PM  Result Value Ref Range   Glucose-Capillary 331 (H) 70 - 99 mg/dL    Comment: Glucose reference range applies only to samples taken after fasting for at least 8 hours.  Glucose, capillary     Status: Abnormal   Collection Time: 03/01/21  7:28 PM  Result Value Ref Range   Glucose-Capillary 261 (H) 70 - 99 mg/dL    Comment: Glucose reference range applies only to samples taken after fasting for at least 8 hours.   Comment 1 Notify RN    Comment 2 Document in Chart   Glucose, capillary     Status: Abnormal   Collection Time: 03/02/21 12:04 AM  Result Value Ref Range   Glucose-Capillary 242 (H) 70 - 99 mg/dL    Comment: Glucose  reference range applies only to samples taken after fasting for at least 8 hours.  Glucose, capillary     Status: Abnormal   Collection Time: 03/02/21  3:35 AM  Result Value Ref Range   Glucose-Capillary 174 (H) 70 - 99 mg/dL    Comment: Glucose reference range applies only to samples taken after fasting for at least 8 hours.  CBC     Status: Abnormal   Collection Time: 03/02/21  5:48 AM  Result Value Ref Range   WBC 16.0 (H) 4.0 - 10.5 K/uL   RBC 4.68 3.87 - 5.11 MIL/uL    Hemoglobin 12.9 12.0 - 15.0 g/dL   HCT 41.4 36.0 - 46.0 %   MCV 88.5 80.0 - 100.0 fL   MCH 27.6 26.0 - 34.0 pg   MCHC 31.2 30.0 - 36.0 g/dL   RDW 13.7 11.5 - 15.5 %   Platelets 280 150 - 400 K/uL   nRBC 0.0 0.0 - 0.2 %    Comment: Performed at [facility], [address], [place], [place] [address]  Basic metabolic panel     Status: Abnormal   Collection Time: 03/02/21  5:48 AM  Result Value Ref Range   Sodium 142 135 - 145 mmol/L   Potassium 4.2 3.5 - 5.1 mmol/L   Chloride 97 (L) 98 - 111 mmol/L   CO2 35 (H) 22 - 32 mmol/L   Glucose, Bld 212 (H) 70 - 99 mg/dL    Comment: Glucose reference range applies only to samples taken after fasting for at least 8 hours.   BUN 30 (H) 8 - 23 mg/dL   Creatinine, Ser 0.93 0.44 - 1.00 mg/dL   Calcium 9.2 8.9 - 10.3 mg/dL   GFR, Estimated >60 >60 mL/min    Comment: (NOTE) Calculated using the CKD-EPI Creatinine Equation (2021)    Anion gap 10 5 - 15    Comment: Performed at [facility], [address] [place], [place] [address]  Glucose, capillary     Status: Abnormal   Collection Time: 03/02/21  7:42 AM  Result Value Ref Range   Glucose-Capillary 233 (H) 70 - 99 mg/dL    Comment: Glucose reference range applies only to samples taken after fasting for at least 8 hours.  Glucose, capillary     Status: Abnormal   Collection Time: 03/02/21 11:39 AM  Result Value Ref Range   Glucose-Capillary 246 (H) 70 - 99 mg/dL    Comment: Glucose reference range applies only to samples taken after fasting for at least 8 hours.  Glucose, capillary     Status: Abnormal   Collection Time: 03/02/21  4:22 PM  Result Value Ref Range   Glucose-Capillary 226 (H) 70 - 99 mg/dL    Comment: Glucose reference range applies only to samples taken after fasting for at least 8 hours.    Current Facility-Administered Medications  Medication Dose Route Frequency Provider Last Rate Last Admin   acetaminophen (TYLENOL) suppository 650 mg  650 mg  Rectal Q4H PRN Sreenath, Sudheer B, MD       acetaminophen (TYLENOL) tablet 650 mg  650 mg Per Tube Q6H PRN Benita Gutter, RPH   650 mg at 03/02/21 0355   albumin human 25 % solution 12.5 g  12.5 g Intravenous Q1200 Ottie Glazier, MD   Stopped at 03/02/21 1313   busPIRone (BUSPAR) tablet 15 mg  15 mg Per Tube BID Flora Lipps, MD   15 mg at 03/02/21 0946   chlorhexidine  gluconate (MEDLINE KIT) (PERIDEX) 0.12 % solution 15 mL  15 mL Mouth Rinse BID Flora Lipps, MD   15 mL at 03/02/21 0755   Chlorhexidine Gluconate Cloth 2 % PADS 6 each  6 each Topical Q0600 [place] Eric J, DO   6 each at 03/02/21 0948   docusate (COLACE) 50 MG/5ML liquid 100 mg  100 mg Per Tube Daily PRN Flora Lipps, MD   100 mg at 02/24/21 1034   enoxaparin (LOVENOX) injection 40 mg  40 mg Subcutaneous QHS Ottie Glazier, MD   40 mg at 03/01/21 2101   escitalopram (LEXAPRO) tablet 30 mg  30 mg Per Tube Daily Imagene Sheller S, DO   30 mg at 03/02/21 0947   feeding supplement (KATE FARMS STANDARD 1.4) liquid 1,440 mL  1,440 mL Per Tube Daily Pabon, Diego F, MD   1,440 mL at 78/67/54 4920   folic acid (FOLVITE) tablet 1 mg  1 mg Per Tube Daily Imagene Sheller S, DO   1 mg at 03/02/21 0946   free water 50 mL  50 mL Per Tube Q4H Ottie Glazier, MD   50 mL at 03/02/21 1654   furosemide (LASIX) injection 40 mg  40 mg Intravenous BID Ottie Glazier, MD   40 mg at 03/02/21 1654   insulin aspart (novoLOG) injection 0-15 Units  0-15 Units Subcutaneous Q4H Ralene Muskrat B, MD   5 Units at 03/02/21 1654   insulin detemir (LEVEMIR) injection 8 Units  8 Units Subcutaneous BID Rust-Chester, Huel Cote, NP   8 Units at 03/02/21 0947   ipratropium-albuterol (DUONEB) 0.5-2.5 (3) MG/3ML nebulizer solution 3 mL  3 mL Nebulization Q2H PRN Imagene Sheller S, DO       ipratropium-albuterol (DUONEB) 0.5-2.5 (3) MG/3ML nebulizer solution 3 mL  3 mL Nebulization TID Ottie Glazier, MD   3 mL at 03/02/21 1312   levofloxacin (LEVAQUIN) IVPB 750 mg   750 mg Intravenous Q48H Ottie Glazier, MD   Stopped at 03/02/21 1506   loratadine (CLARITIN) tablet 10 mg  10 mg Per Tube Daily Benita Gutter, RPH   10 mg at 03/02/21 0947   MEDLINE mouth rinse  15 mL Mouth Rinse 10 times per day Flora Lipps, MD   15 mL at 03/02/21 1737   melatonin tablet 5 mg  5 mg Per Tube QHS Lockie Mola B, RPH   5 mg at 03/01/21 2121   metoprolol tartrate (LOPRESSOR) injection 5 mg  5 mg Intravenous Q6H PRN Rust-Chester, Britton L, NP   5 mg at 03/01/21 1829   metoprolol tartrate (LOPRESSOR) tablet 25 mg  25 mg Per Tube Daily Anwar, Bonnee Quin S, DO   25 mg at 03/02/21 1007   multivitamin liquid 15 mL  15 mL Per Tube Daily Ottie Glazier, MD   15 mL at 03/02/21 0947   pantoprazole sodium (PROTONIX) 40 mg/20 mL oral suspension 40 mg  40 mg Per Tube Daily Chinita Greenland A, RPH   40 mg at 03/02/21 0754   pravastatin (PRAVACHOL) tablet 20 mg  20 mg Per Tube q1800 Ralene Muskrat B, MD   20 mg at 03/02/21 1654   rOPINIRole (REQUIP) tablet 0.5 mg  0.5 mg Per Tube QHS Imagene Sheller S, DO   0.5 mg at 03/01/21 2100    Musculoskeletal: Strength & Muscle Tone:  did not assess Gait & Station:  did not assess Patient leans: N/A   Psychiatric Specialty Exam:  Presentation  General Appearance: No data recorded Eye Contact:No data recorded  Speech:No data recorded Speech Volume:No data recorded Handedness:No data recorded  Mood and Affect  Mood:No data recorded Affect:No data recorded  Thought Process  Thought Processes:No data recorded Descriptions of Associations:No data recorded Orientation:No data recorded Thought Content:No data recorded History of Schizophrenia/Schizoaffective disorder:No data recorded Duration of Psychotic Symptoms:No data recorded Hallucinations:No data recorded Ideas of Reference:No data recorded Suicidal Thoughts:No data recorded Homicidal Thoughts:No data recorded  Sensorium  Memory:No data recorded Judgment:No data  recorded Insight:No data recorded  Executive Functions  Concentration:No data recorded Attention Span:No data recorded Recall:No data recorded Fund of Knowledge:No data recorded Language:No data recorded  Psychomotor Activity  Psychomotor Activity:No data recorded  Assets  Assets:No data recorded  Sleep  Sleep:No data recorded  Physical Exam: Physical Exam Vitals reviewed.  HENT:     Head: Normocephalic.     Nose: No congestion or rhinorrhea.  Eyes:     General:        Right eye: No discharge.        Left eye: No discharge.  Pulmonary:     Comments: Trach, on 02 Skin:    General: Skin is dry.  Neurological:     Mental Status: She is alert and oriented to person, place, and time.  Psychiatric:        Attention and Perception: Attention normal.        Mood and Affect: Mood normal.        Behavior: Behavior is cooperative.   Review of Systems  Psychiatric/Behavioral:  Negative for depression, substance abuse and suicidal ideas.   Blood pressure 117/65, pulse (!) 114, temperature 100 F (37.8 C), temperature source Axillary, resp. rate 18, height 5' 4.02" (1.626 m), weight 73.2 kg, SpO2 96 %. Body mass index is 27.69 kg/m.  Treatment Plan Summary: Plan No medication changes recommended  Disposition: No evidence of imminent risk to self or others at present.   Patient does not meet criteria for psychiatric inpatient admission. Supportive therapy provided about ongoing stressors.  Sherlon Handing, NP 03/02/2021 7:18 PM

## 2021-03-02 NOTE — Progress Notes (Signed)
Daily Progress Note   Patient Name: Angela David       Date: 03/02/2021 DOB: 03/17/1940  Age: 81 y.o. MRN#: [id number] Attending Physician: Ottie Glazier, MD Primary Care Physician: Iva Boop, MD Admit Date: 02/20/2021  Reason for Consultation/Follow-up: Establishing goals of care  Subjective: In to see patient to assess status. Trach with TC and O2 in place. She is not responsive to me. No family at bedside at this time. Will f/u Monday on my return.   Length of Stay: 10  Current Medications: Scheduled Meds:   busPIRone  15 mg Per Tube BID   chlorhexidine gluconate (MEDLINE KIT)  15 mL Mouth Rinse BID   Chlorhexidine Gluconate Cloth  6 each Topical Q0600   enoxaparin (LOVENOX) injection  40 mg Subcutaneous QHS   escitalopram  30 mg Per Tube Daily   feeding supplement (KATE FARMS STANDARD 1.4)  1,440 mL Per Tube Daily   folic acid  1 mg Per Tube Daily   free water  50 mL Per Tube Q4H   furosemide  40 mg Intravenous BID   insulin aspart  0-15 Units Subcutaneous Q4H   insulin detemir  8 Units Subcutaneous BID   ipratropium-albuterol  3 mL Nebulization TID   loratadine  10 mg Per Tube Daily   mouth rinse  15 mL Mouth Rinse 10 times per day   melatonin  5 mg Per Tube QHS   metoprolol tartrate  25 mg Per Tube Daily   multivitamin  15 mL Per Tube Daily   pantoprazole sodium  40 mg Per Tube Daily   pravastatin  20 mg Per Tube q1800   rOPINIRole  0.5 mg Per Tube QHS    Continuous Infusions:  albumin human 12.5 g (03/02/21 1132)   levofloxacin (LEVAQUIN) IV      PRN Meds: acetaminophen, acetaminophen, docusate, ipratropium-albuterol, metoprolol tartrate  Physical Exam Constitutional:      Comments: Eyes closed. Not responsive to me.   Pulmonary:      Comments: Trach in place with TC and O2.  Skin:    General: Skin is warm and dry.            Vital Signs: BP 112/74    Pulse (!) 123    Temp (!) 100.9 F (38.3 C) (Axillary)    Resp  16    Ht 5' 4.02" (1.626 m)    Wt 73.2 kg    SpO2 96%    BMI 27.69 kg/m  SpO2: SpO2: 96 % O2 Device: O2 Device: Ventilator O2 Flow Rate: O2 Flow Rate (L/min): 8 L/min  Intake/output summary:  Intake/Output Summary (Last 24 hours) at 03/02/2021 1159 Last data filed at 03/02/2021 1000 Gross per 24 hour  Intake 220 ml  Output 1550 ml  Net -1330 ml   LBM: Last BM Date: 03/01/21 Baseline Weight: Weight: 73.5 kg Most recent weight: Weight: 73.2 kg        Patient Active Problem List   Diagnosis Date Noted   Acute pulmonary edema (HCC)    Somnolence    Sepsis ([place]) 02/20/2021   Abdominal wall abscess    Chronic respiratory failure with hypoxia ([place]) 03/29/2020   Chronic diastolic CHF (congestive heart failure) ([place]) 01/31/2020   Uncontrolled type 2 diabetes mellitus with hyperglycemia ([place]) 01/31/2020   AF (paroxysmal atrial fibrillation) ([place]) 01/31/2020   Aspiration pneumonia of both lower lobes due to gastric secretions ([place]) 35/00/9381   Acute metabolic encephalopathy 82/99/3716   Acute respiratory failure ([place]) 01/23/2020   Left lower lobe pneumonia 01/16/2020   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   Weakness    Hypoxia    Acute respiratory failure with hypoxia and hypercapnia ([place]) 03/21/2019   Hypotension 03/21/2019   Acute encephalopathy 03/21/2019   Multiple fractures of ribs, right side, initial encounter for closed fracture 12/25/2018   Colon polyp 11/06/2018   Esophageal dysphagia    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    Stricture and stenosis of esophagus    Elevated CEA 09/02/2018   Goals of care, counseling/discussion 09/02/2018   Polyp of transverse colon 09/02/2018    Foreign body in right foot 05/26/2018   History of tracheostomy 04/30/2018   Moderate episode of recurrent major depressive disorder ([place]) 04/03/2018   Poor balance 04/03/2018   Shoulder lesion, right 02/28/2017   Cervical radiculopathy 09/28/2016   Peripheral neuropathy 08/28/2016   Diabetic peripheral neuropathy ([place]) 08/28/2016   Leg weakness, bilateral 08/13/2016   Gait abnormality 08/13/2016   Carcinoma of overlapping sites of right breast in female, estrogen receptor negative ([place]) 12/27/2015   Malignant neoplasm of breast ([place]) 03/28/2015   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   Hypercholesterolemia 03/28/2015   Recurrent major depressive disorder, in partial remission ([place]) 03/01/2015   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   Cephalalgia 09/15/2014   Headache due to trauma 09/15/2014   Osteopenia 01/07/2014   Age-related osteoporosis without current pathological fracture 01/07/2014   History of colonic polyps 12/14/2013   History of breast cancer 11/26/2012   H/O malignant neoplasm of breast 11/26/2012   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 I will f/u Monday on my return.    Code Status:    Code Status Orders  (From admission, onward)           Start     Ordered   02/20/21 1610  Limited resuscitation (code)  Continuous       Question Answer  Comment  In the event of cardiac or respiratory ARREST: Initiate Code Blue, Call Rapid Response Yes   In the event of cardiac or respiratory ARREST: Perform CPR No   In the event of cardiac or respiratory ARREST: Perform Intubation/Mechanical Ventilation Yes    In the event of cardiac or respiratory ARREST: Use NIPPV/BiPAp only if indicated Yes   In the event of cardiac or respiratory ARREST: Administer ACLS medications if indicated Yes   In the event of cardiac or respiratory ARREST: Perform Defibrillation or Cardioversion if indicated Yes   Comments MOST form on chart. If she is found not breathing or with no heartbeat, she wishes to be left that way with no intervention.      02/20/21 1613           Code Status History     Date Active Date Inactive Code Status Order ID Comments User Context   01/26/2020 1058 02/13/2020 1818 Partial Code [id number]  Asencion Gowda, NP Inpatient   01/23/2020 2152 01/26/2020 1058 Full Code [id number]  Rust-Chester, Huel Cote, NP ED   01/15/2020 2239 01/21/2020 2131 Full Code [id number]  CoxBriant Cedar, DO ED   03/21/2019 0108 04/01/2019 2227 Full Code [id number]  Awilda Bill, NP ED   12/26/2018 0336 12/31/2018 2219 Full Code [id number]  Tylene Fantasia, PA-C ED   11/06/2018 1233 11/09/2018 1917 Full Code [id number]  Jules Husbands, MD Inpatient      Care plan was discussed with RN  Thank you for allowing the Palliative Medicine Team to assist in the care of this patient.       Total Time 15 min Prolonged Time Billed  no       Greater than 50%  of this time was spent counseling and coordinating care related to the above assessment and plan.  Asencion Gowda, NP  Please contact Palliative Medicine Team phone at [phone] for questions and concerns.

## 2021-03-02 NOTE — Progress Notes (Signed)
Pharmacy Antibiotic Note  Angela David is a 81 y.o. female admitted on 02/20/2021.  Pharmacy has been consulted for Levaquin dosing d/t c/f HCAP. Pt was recently placed on Augmentin for possible cellulitis as an outpatient, completed 8d course of Vancomycin & Cefepime on current admission. Initially improved and antibiotics narrowed to target respiratory cultures. After 3-4d of minocycline, WBC inc'ing, fevering again. Repeating respiratory cultures and changing minocycline to levaquin based on prior culture.  Plan: WBC: 8.7>10.8>16; Temp 101.6 > 100.9 (despite APAP)  Minocycline >> levaquin 750mg  q48h (per current renal function; BL for inc) Coverage of Stenotrophomonas maltophilia and flavobacterium (see cultures/image)  Will continue to monitor renal function and adjust dose as clinically indicated  Height: 5' 4.02" (162.6 cm) Weight: 73.2 kg (161 lb 6 oz) IBW/kg (Calculated) : 54.74  Temp (24hrs), Avg:100 F (37.8 C), Min:98 F (36.7 C), Max:101.6 F (38.7 C)  Recent Labs  Lab 02/26/21 0414 02/27/21 0456 02/28/21 0524 03/01/21 0508 03/02/21 0548  WBC 7.6 8.4 8.7 10.8* 16.0*  CREATININE 1.14* 0.93 0.93 0.88 0.93     Estimated Creatinine Clearance: 48.9 mL/min (by C-G formula based on SCr of 0.93 mg/dL).    Allergies  Allergen Reactions   Shellfish Allergy Shortness Of Breath    respitatory distress    Sodium Sulfite Shortness Of Breath    Severe Congestion that creates inability to breath   Sulfa Antibiotics Shortness Of Breath and Other (See Comments)    respitatory distress   Codeine Other (See Comments)    Per patient "it kept her up all night"   Glucerna [Alimentum] Itching   Wound Dressing Adhesive Rash    Antimicrobials this admission: VAN (12/26>> 12/29)    (01/01) CFP (12/26>>1/2) MTZ (12/30 >> 1/2)      Minocycline (1/2>>1/5)                    Levaquin (1/5>>  Microbiology results: 1/05 Rcx: sent 01/01 BCx: NGTD 12/28 BCx: NGTD 12/26 BCx:  NG final 12/30 RCx: FEW Steno Malto & Flavobacterium indologenes (see susc below) 12/26 MRSA PCR: not detected  12/26 respiratory panel: negative 12/26 SARS CoV-2: negative 12/26 influenza A/B: negative    Thank you for allowing pharmacy to be a part of this patients care.  Lorna Dibble, PharmD 03/02/2021 11:01 AM

## 2021-03-02 NOTE — Progress Notes (Signed)
Inpatient Diabetes Program Recommendations  AACE/ADA: New Consensus Statement on Inpatient Glycemic Control (2015)  Target Ranges:  Prepandial:   less than 140 mg/dL      Peak postprandial:   less than 180 mg/dL (1-2 hours)      Critically ill patients:  140 - 180 mg/dL   Lab Results  Component Value Date   GLUCAP 233 (H) 03/02/2021   HGBA1C 6.3 (H) 01/20/2020    Review of Glycemic Control  Latest Reference Range & Units 03/01/21 07:58 03/01/21 11:57 03/01/21 16:14 03/01/21 19:28 03/02/21 00:04 03/02/21 03:35 03/02/21 07:42  Glucose-Capillary 70 - 99 mg/dL 215 (H) 317 (H) 331 (H) 261 (H) 242 (H) 174 (H) 233 (H)  (H): Data is abnormally high  Diabetes history: DM2 Outpatient Diabetes medications: Metformin 1000 mg QD Current orders for Inpatient glycemic control: Novolog 0-15 units Q4H, Levemir 8 BID  Inpatient Diabetes Program Recommendations:    Levemir 10 units BID   Will continue to follow while inpatient.  Thank you, Reche Dixon, MSN, RN Diabetes Coordinator Inpatient Diabetes Program [phone] (team pager from 8a-5p)

## 2021-03-03 ENCOUNTER — Encounter: Payer: Self-pay | Admitting: Internal Medicine

## 2021-03-03 DIAGNOSIS — J690 Pneumonitis due to inhalation of food and vomit: Secondary | ICD-10-CM | POA: Diagnosis not present

## 2021-03-03 DIAGNOSIS — R509 Fever, unspecified: Secondary | ICD-10-CM | POA: Diagnosis not present

## 2021-03-03 LAB — CULTURE, BLOOD (ROUTINE X 2)
Culture: NO GROWTH
Culture: NO GROWTH
Special Requests: ADEQUATE
Special Requests: ADEQUATE

## 2021-03-03 LAB — CBC
HCT: 39.5 % (ref 36.0–46.0)
Hemoglobin: 11.9 g/dL — ABNORMAL LOW (ref 12.0–15.0)
MCH: 26.8 pg (ref 26.0–34.0)
MCHC: 30.1 g/dL (ref 30.0–36.0)
MCV: 89 fL (ref 80.0–100.0)
Platelets: 287 10*3/uL (ref 150–400)
RBC: 4.44 MIL/uL (ref 3.87–5.11)
RDW: 13.7 % (ref 11.5–15.5)
WBC: 15.6 10*3/uL — ABNORMAL HIGH (ref 4.0–10.5)
nRBC: 0 % (ref 0.0–0.2)

## 2021-03-03 LAB — GLUCOSE, CAPILLARY
Glucose-Capillary: 163 mg/dL — ABNORMAL HIGH (ref 70–99)
Glucose-Capillary: 183 mg/dL — ABNORMAL HIGH (ref 70–99)
Glucose-Capillary: 193 mg/dL — ABNORMAL HIGH (ref 70–99)
Glucose-Capillary: 209 mg/dL — ABNORMAL HIGH (ref 70–99)
Glucose-Capillary: 219 mg/dL — ABNORMAL HIGH (ref 70–99)
Glucose-Capillary: 248 mg/dL — ABNORMAL HIGH (ref 70–99)
Glucose-Capillary: 264 mg/dL — ABNORMAL HIGH (ref 70–99)

## 2021-03-03 LAB — RESP PANEL BY RT-PCR (FLU A&B, COVID) ARPGX2
Influenza A by PCR: POSITIVE — AB
Influenza B by PCR: NEGATIVE
SARS Coronavirus 2 by RT PCR: NEGATIVE

## 2021-03-03 LAB — PROCALCITONIN: Procalcitonin: 0.17 ng/mL

## 2021-03-03 MED ORDER — OSELTAMIVIR PHOSPHATE 30 MG PO CAPS
30.0000 mg | ORAL_CAPSULE | Freq: Two times a day (BID) | ORAL | Status: DC
  Administered 2021-03-04 – 2021-03-06 (×5): 30 mg
  Filled 2021-03-03 (×7): qty 1

## 2021-03-03 MED ORDER — OSELTAMIVIR PHOSPHATE 75 MG PO CAPS: 75.0000 mg | ORAL_CAPSULE | Freq: Two times a day (BID) | ORAL | Status: DC

## 2021-03-03 MED ORDER — INSULIN ASPART 100 UNIT/ML IJ SOLN
0.0000 [IU] | INTRAMUSCULAR | Status: DC
  Administered 2021-03-04: 3 [IU] via SUBCUTANEOUS
  Administered 2021-03-04: 7 [IU] via SUBCUTANEOUS
  Administered 2021-03-04: 4 [IU] via SUBCUTANEOUS
  Administered 2021-03-04: 7 [IU] via SUBCUTANEOUS
  Administered 2021-03-04: 4 [IU] via SUBCUTANEOUS
  Administered 2021-03-04 (×2): 11 [IU] via SUBCUTANEOUS
  Administered 2021-03-05: 4 [IU] via SUBCUTANEOUS
  Administered 2021-03-05: 7 [IU] via SUBCUTANEOUS
  Administered 2021-03-05 – 2021-03-06 (×7): 4 [IU] via SUBCUTANEOUS
  Filled 2021-03-03 (×16): qty 1

## 2021-03-03 MED ORDER — INSULIN DETEMIR 100 UNIT/ML ~~LOC~~ SOLN
10.0000 [IU] | Freq: Two times a day (BID) | SUBCUTANEOUS | Status: DC
  Administered 2021-03-04 – 2021-03-06 (×5): 10 [IU] via SUBCUTANEOUS
  Filled 2021-03-03 (×7): qty 0.1

## 2021-03-03 MED ORDER — OSELTAMIVIR PHOSPHATE 75 MG PO CAPS
75.0000 mg | ORAL_CAPSULE | Freq: Once | ORAL | Status: AC
  Administered 2021-03-04: 75 mg
  Filled 2021-03-03: qty 1

## 2021-03-03 NOTE — Progress Notes (Signed)
Acute Respiratory Failure  ID ordered a repeat COVID-19, Influenza swab. - Influenza A positive - Discussed with Pharmacy Tamiflu started per tube - will continue Levaquin for now   Domingo Pulse Rust-Chester, AGACNP-BC Acute Care Nurse Practitioner [place]   [phone] / [phone] Please see Amion for pager details.

## 2021-03-03 NOTE — Progress Notes (Signed)
NAME:  Angela David, MRN:  [id number], DOB:  Jul 01, 1940, LOS: 5 ADMISSION DATE:  02/20/2021, CONSULTATION DATE:  02/20/2021 REFERRING MD: Imagene Sheller, MD. 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   02/27/21- patient remains on ventilator. S/p sugery evaluation of PEG tube. Resp cultures are stenotrophomonas.  She has pneumonia with parapneumonic effusion concerning for possible empyema. [name] at bedside reveals no pleural effusion bilaterally 02/29/20- patient weaned down on FIO2 today , will attemp SBT in am for liberation from MV. Diuresed well today >3L with improved resp status 03/01/21- patient exhibiting signs of severe derpression , she voiced not wanting to continue treatment medically, I have placed consult for palliative care and psychiatry. 03/02/20-  Patient still with what appears to be deprssion. I met with husband today and patient was evaluated by psychiatry today.  03/03/20-  patient seems to have component of pneumonia induced tachycardia and possible septic encephalopathy.  ID consult due to unclear infection status  Pertinent  Medical History   Right Breast Cancer Tx'd with chemotherapy and XRT    Chronic respiratory failure ([place])    s/p tracheostomy in 2012; on ventilatory support at hs  Diabetic peripheral neuropathy ([place]) 08/28/2016  GERD (gastroesophageal reflux disease)    Glaucoma    High cholesterol    History of colon polyps    Osteoarthritis    Personal history of chemotherapy 2002   BREAST CA  Personal history of radiation therapy 2002  BREAST CA  S/P percutaneous endoscopic gastrostomy (PEG) tube placement ([place])    T2DM (type 2 diabetes mellitus) ([place])    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 PCCM consulted for vent management 12/26: General surgery consulted 12/27: Remains on full vent support. 12/28: Remains agitated on the vent. CT head and chest obtained 12/30: Patient with episode of respiratory distress due to suspected aspiration. Bolus feed held.STAT CXR performed which revealed a worsening R pleural effusion. 12/31: Remains on the vent.  Cultures:  01/01 BCx: pending 01/01 UCx: pending 12/28 BCx: NGTD 12/26 BCx: NG final 12/30 RCx: NG final 12/26 MRSA PCR: not detected  12/26 respiratory panel: negative 12/26 SARS CoV-2: negative 12/26 influenza A/B: negative 12/26: Strep pneumo urinary antigen>>negative 12/26: Legionella urinary antigen>>negative   Antimicrobials:  12/26 vancomycin >> 12/29    01/01 >> 12/30 metronidazole >> 12/26 cefepime >> Interim History / Subjective:  -No acute events overnight. -Remains febrile, cultures/test obtained and pending  OBJECTIVE   Blood pressure 137/60, pulse (!) 120, temperature 100.1 F (37.8 C), temperature source Axillary, resp. rate 18, height 5' 4.02" (1.626 m), weight 71.4 kg, SpO2 95 %.    Vent Mode: PRVC FiO2 (%):  [35 %-45 %] 40 % Set Rate:  [14 bmp] 14 bmp Vt Set:  [440 mL] 440 mL PEEP:  [5 cmH20] 5 cmH20 Plateau Pressure:  [  19 cmH20] 19 cmH20   Intake/Output Summary (Last 24 hours) at 03/03/2021 1111 Last data filed at 03/03/2021 0807 Gross per 24 hour  Intake 1609.93 ml  Output 1050 ml  Net 559.93 ml    Filed  Weights   02/27/21 0451 03/02/21 0426 03/03/21 0500  Weight: 75.2 kg 73.2 kg 71.4 kg   Examination: GENERAL:age-year-old patient lying in the bed on the vent via trach EYES: Pupils equal, round, reactive to light and accommodation. No scleral icterus. Extraocular muscles intact.  HEENT: Head atraumatic, normocephalic. Oropharynx and nasopharynx clear.  NECK:  Supple, no jugular venous distention. No thyroid enlargement, no tenderness.  LUNGS: mild rhonchi CARDIOVASCULAR: S1, S2 normal. No murmurs, rubs, or gallops.  ABDOMEN: Soft, nontender, nondistended. Bowel sounds present. No organomegaly or mass. PEG tube in place EXTREMITIES: No pedal edema, cyanosis, or clubbing.  NEUROLOGIC: Cranial nerves II through XII are intact. Muscle strength 5/5 in all extremities. Sensation intact. Gait not checked.  PSYCHIATRIC: The patient is alert and oriented x 1. Nods appropriately SKIN: No obvious rash, lesion, or ulcer.   Labs/imaging that I havepersonally reviewed  (right click and "Reselect all SmartList Selections" daily)    Labs   CBC: Recent Labs  Lab 02/27/21 0456 02/28/21 0524 03/01/21 0508 03/02/21 0548 03/03/21 0947  WBC 8.4 8.7 10.8* 16.0* 15.6*  HGB 11.0* 11.5* 13.0 12.9 11.9*  HCT 35.0* 37.2 41.0 41.4 39.5  MCV 85.6 86.7 85.4 88.5 89.0  PLT 166 165 207 280 287     Basic Metabolic Panel: Recent Labs  Lab 02/25/21 0407 02/26/21 0414 02/26/21 2016 02/26/21 2225 02/27/21 0456 02/28/21 0524 03/01/21 0508 03/02/21 0548  NA 142 144  --   --  146* 144 139 142  K 4.3 3.4*  --  3.3* 3.6 4.1 3.8 4.2  CL 101 102  --   --  106 107 98 97*  CO2 32 32  --   --  '29 28 31 ' 35*  GLUCOSE 231* 198*  --   --  193* 157* 218* 212*  BUN 28* 36*  --   --  32* 21 24* 30*  CREATININE 0.85 1.14*  --   --  0.93 0.93 0.88 0.93  CALCIUM 8.2* 8.5*  --   --  8.4* 8.8* 9.0 9.2  MG 2.4 2.0  --   --  1.9 2.2  --   --   PHOS 3.0 1.6* 2.3*  --  2.4* 2.2*  --   --     GFR: Estimated Creatinine  Clearance: 48.3 mL/min (by C-G formula based on SCr of 0.93 mg/dL). Recent Labs  Lab 02/25/21 0407 02/26/21 0414 02/28/21 0524 03/01/21 0508 03/02/21 0548 03/03/21 0947  PROCALCITON 1.46  --   --   --   --   --   WBC 5.5   < > 8.7 10.8* 16.0* 15.6*   < > = values in this interval not displayed.     Liver Function Tests: Recent Labs  Lab 02/26/21 0414 02/27/21 0456  AST 30 27  ALT 26 20  ALKPHOS 77 68  BILITOT 0.8 0.8  PROT 6.8 6.4*  ALBUMIN 2.7* 2.7*    No results for input(s): LIPASE, AMYLASE in the last 168 hours. No results for input(s): AMMONIA in the last 168 hours.  ABG    Component Value Date/Time   PHART 7.44 02/03/2020 1055   PCO2ART 53 (H) 02/03/2020 1055   PO2ART 92 02/03/2020 1055   HCO3 36.0 (H) 02/03/2020 1055  ACIDBASEDEF 0.9 01/19/2020 1024   O2SAT 97.4 02/03/2020 1055      Coagulation Profile: No results for input(s): INR, PROTIME in the last 168 hours.   Cardiac Enzymes: No results for input(s): CKTOTAL, CKMB, CKMBINDEX, TROPONINI in the last 168 hours.  HbA1C: Hgb A1c MFr Bld  Date/Time Value Ref Range Status  01/20/2020 07:19 AM 6.3 (H) 4.8 - 5.6 % Final    Comment:    (NOTE) Pre diabetes:          5.7%-6.4%  Diabetes:              >6.4%  Glycemic control for   <7.0% adults with diabetes   03/21/2019 03:24 AM 6.6 (H) 4.8 - 5.6 % Final    Comment:    (NOTE) Pre diabetes:          5.7%-6.4% Diabetes:              >6.4% Glycemic control for   <7.0% adults with diabetes     CBG: Recent Labs  Lab 03/02/21 2006 03/03/21 0103 03/03/21 0438 03/03/21 0715 03/03/21 1103  GLUCAP 226* 193* 264* 219* 183*     Allergies Allergies  Allergen Reactions   Shellfish Allergy Shortness Of Breath    respitatory distress    Sodium Sulfite Shortness Of Breath    Severe Congestion that creates inability to breath   Sulfa Antibiotics Shortness Of Breath and Other (See Comments)    respitatory distress   Codeine Other (See  Comments)    Per patient "it kept her up all night"   Glucerna [Alimentum] Itching   Wound Dressing Adhesive Rash      Home Medications  Prior to Admission medications   Medication Sig Start Date End Date Taking? Authorizing Provider  acetaminophen (TYLENOL) 500 MG tablet Take 1,000 mg by mouth in the morning and at bedtime.   Yes [provider]  busPIRone (BUSPAR) 15 MG tablet Place 15 mg into feeding tube 2 (two) times daily. 09/26/20  Yes [provider]  carboxymethylcellulose (REFRESH PLUS) 0.5 % SOLN Place 1 drop into both eyes 3 (three) times daily as needed (dry eyes).   Yes [provider]  denosumab (PROLIA) 60 MG/ML SOSY injection Inject 60 mg into the skin every 6 (six) months.   Yes [provider]  EPINEPHrine 0.3 mg/0.3 mL IJ SOAJ injection epinephrine 0.3 mg/0.3 mL injection, auto-injector  Inject 0.3 mLs (0.3 mg dose) into the muscle once as needed for Anaphylaxis for up to 1 dose. 09/10/18  Yes [provider]  escitalopram (LEXAPRO) 10 MG tablet Place 3 tablets (30 mg total) into feeding tube daily. Patient taking differently: Place 10 mg into feeding tube daily. 02/13/20  Yes Kasa, Maretta Bees, MD  escitalopram (LEXAPRO) 20 MG tablet Place 20 mg into feeding tube daily.   Yes [provider]  folic acid (FOLVITE) 1 MG tablet Place 1 tablet (1 mg total) into feeding tube daily. 02/13/20  Yes Flora Lipps, MD  gabapentin (NEURONTIN) 600 MG tablet Place 600 mg into feeding tube 3 (three) times daily.   Yes [provider]  lovastatin (MEVACOR) 20 MG tablet Place 20 mg into feeding tube daily. 06/13/20  Yes [provider]  meclizine (ANTIVERT) 25 MG tablet Take 25 mg by mouth daily. 02/17/21  Yes [provider]  melatonin 5 MG TABS Place 1 tablet (5 mg total) into feeding tube at bedtime. 02/13/20  Yes Flora Lipps, MD  metFORMIN (GLUCOPHAGE) 1000 MG tablet Place  1,000 mg into feeding tube daily with  breakfast. 06/10/20  Yes [provider]  metoprolol tartrate (LOPRESSOR) 25 MG tablet Place 25 mg into feeding tube daily. 06/13/20  Yes [provider]  Multiple Vitamins-Minerals (HAIR/SKIN/NAILS) TABS Take 1 tablet by mouth daily.   Yes [provider]  [facility] powder Apply topically. 01/18/21  Yes [provider]  pantoprazole (PROTONIX) 40 MG tablet Take 40 mg by mouth daily.   Yes [provider]  rOPINIRole (REQUIP) 0.5 MG tablet Place 1 tablet (0.5 mg total) into feeding tube at bedtime. 02/13/20  Yes Kasa, Maretta Bees, MD  sodium chloride 0.9 % nebulizer solution sodium chloride 0.9 % for nebulization  USE 5 ML BY NEBULIZTION AS NEEDED FOR WHEEZING 07/17/19  Yes [provider]  Scheduled Meds:  busPIRone  15 mg Per Tube BID   chlorhexidine gluconate (MEDLINE KIT)  15 mL Mouth Rinse BID   Chlorhexidine Gluconate Cloth  6 each Topical Q0600   enoxaparin (LOVENOX) injection  40 mg Subcutaneous QHS   escitalopram  30 mg Per Tube Daily   feeding supplement (KATE [place]ARMS STANDARD 1.4)  1,440 mL Per Tube Daily   folic acid  1 mg Per Tube Daily   free water  50 mL Per Tube Q4H   insulin aspart  0-15 Units Subcutaneous Q4H   insulin detemir  8 Units Subcutaneous BID   ipratropium-albuterol  3 mL Nebulization TID   loratadine  10 mg Per Tube Daily   mouth rinse  15 mL Mouth Rinse 10 times per day   melatonin  5 mg Per Tube QHS   metoprolol tartrate  25 mg Per Tube Daily   multivitamin  15 mL Per Tube Daily   pantoprazole sodium  40 mg Per Tube Daily   pravastatin  20 mg Per Tube q1800   rOPINIRole  0.5 mg Per Tube QHS   Continuous Infusions:  albumin human Stopped (03/02/21 1313)   levofloxacin (LEVAQUIN) IV Stopped (03/02/21 1506)   PRN Meds:.acetaminophen, acetaminophen, docusate, ipratropium-albuterol, metoprolol tartrate   ASSESSMENT & PLAN  Acute on Chronic Hypoxic Respiratory Failure secondary to suspected aspiration in the  setting of 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 -continue ventilator support & lung protective strategies -Wean PEEP & FiO2 as tolerated, maintain SpO2 > 90% -Head of bed elevated 30 degrees -Intermittent chest x-ray & ABG PRN -Ensure adequate pulmonary hygiene  -bronchodilators PRN -Lopressor  PRN from 2.5 to 5 mg Q 6 PRN -Xanax TID added back as PRN for anxiety  Sepsis due to suspected Abdominal Abscess & recurrent Aspiration Pneumonia Met sepsis criteria on admission Follow up Chest CT with right pleural effusion and bilateral lower lobe consolidation concerning for aspiration pneumonia -F/u cultures, trend lactic/ PCT -Monitor WBC/ fever curve -IV antibiotics: cefepime & vancomycin&Flagyl -IVF hydration as needed -Pressors for MAP goal >65 -Strict I/O's    -stenotrophomonas + on resp cx Possible subcutaneous periumbilical abscess -CT Abdomen/Pelvis concerning for possible abscess  -Per surgery restart continuous TF at low rate,if aspiration continues will obtain GI series with oral contrast to the gastrostomy can be helpful.  If gastroparesis is not the cause of her reflux and aspiration another option is to consider exchanging the gastrostomy tube to a gastrojejunostomy tube under fluoroscopic guidance. -Consider Reglan for GI motility -General surgery consult appreciated    Diabetes mellitus      -CBG's -SSI and Lantus -Follow ICU Hypo/hyperglycemia protocol    Nutritional Status/Diet -Will start trickle TF  per PEG. General Surgery ok to use -Dietician following, TF's as tolerated -Constipation protocol as indicated    Electrolytes -follow labs as needed -replace as needed -pharmacy consultation and following    Best practice (right click and "Reselect all SmartList Selections" daily)  Diet:  Tube Feed  Pain/Anxiety/Delirium protocol (if indicated): Yes (RASS goal 0) VAP protocol (if indicated): Yes DVT prophylaxis:  Subcutaneous Heparin GI prophylaxis: PPI Glucose control:  SSI Yes Central venous access:  N/A Arterial line:  N/A Foley:  Yes, and it is still needed Mobility:  bed rest  PT consulted: N/A Last date of multidisciplinary goals of care discussion [02/26/21] Code Status:  full code Disposition: ICU  Critical care provider statement:   Total critical care time: 33 minutes   Performed by: Lanney Gins MD   Critical care time was exclusive of separately billable procedures and treating other patients.   Critical care was necessary to treat or prevent imminent or life-threatening deterioration.   Critical care was time spent personally by me on the following activities: development of treatment plan with patient and/or surrogate as well as nursing, discussions with consultants, evaluation of patient's response to treatment, examination of patient, obtaining history from patient or surrogate, ordering and performing treatments and interventions, ordering and review of laboratory studies, ordering and review of radiographic studies, pulse oximetry and re-evaluation of patient's condition.    Ottie Glazier, M.D.  Pulmonary & Critical Care Medicine

## 2021-03-03 NOTE — Progress Notes (Signed)
OT Cancellation Note  Patient Details Name: Winona Sison MRN: [id number] DOB: 12-Mar-1940   Cancelled Treatment:    Reason Eval/Treat Not Completed: Fatigue/lethargy limiting ability to participate. Pt unable to tolerate ADL session following mobility attempts with PT this AM. Pt minimally responsive to noxious stimuli. Will continue to follow POC as pt able to participate.   Dessie Coma, M.S. OTR/L  03/03/21, 12:21 PM  ascom [phone]

## 2021-03-03 NOTE — TOC Progression Note (Signed)
Transition of Care [facility]) - Progression Note    Patient Details  Name: Annarose Ouellet MRN: [id number] Date of Birth: [birth year]  [facility] of [facility] CM/SW Contact  Shelbie Hutching, RN Phone Number: 03/03/2021, 7:31 PM  Clinical Narrative:    Patient remains on the vent.  Palliative has been consulted and will follow up with family on Monday.   TOC continues to follow.    Expected Discharge Plan: Home/Self Care Barriers to Discharge: Continued Medical Work up  Expected Discharge Plan and Services Expected Discharge Plan: Home/Self Care   Discharge Planning Services: CM Consult   Living arrangements for the past 2 months: Single Family Home                 DME Arranged: N/A DME Agency: NA       HH Arranged: Refused [place] Agency: NA         Social Determinants of Health (SDOH) Interventions    Readmission Risk Interventions Readmission Risk Prevention Plan 02/23/2021  Transportation Screening Complete  PCP or Specialist Appt within 3-5 Days Complete  HRI or [place] Complete  Social Work Consult for [place] Planning/Counseling Complete  Palliative Care Screening Not Applicable  Medication Review [employer] photographer) Complete  Some recent data might be hidden

## 2021-03-03 NOTE — Consult Note (Signed)
NAME: Angela David  DOB: 04-Jul-1940  MRN: [id number]  Date/Time: 03/03/2021 9:23 PM  REQUESTING PROVIDER: Lanney Gins Subjective:  REASON FOR CONSULT: Fever ? Angela David is a 81 y.o. female with a history of ductal carcinoma RT breast, s/p lumpectomy in 2002  tracheal stenosis, vocal cord paralysis  chronic resp failure s/p tracheostomy in 2012, PEG, T2DM, HLD, GERD [facility] with pain and drainage from the peg site. She was also having fever, malaise and was hypoxic to 80% on RA. Went to [facility] ED on 02/17/21 with redness at peg site and was given augmentin In our  ED temp 99, BP 126/69, HR 109, RR 24 WBC 10, HB 12.5, PLT 130, cr 0.94 Chest x-ray shows no signs of infiltrate or consolidation consistent with pneumonia.  COVID-19 PCR was negative. CT abdomen pelvis with contrast demonstrated possible periumbilical deep abdominal wall abscess.  Start broad-spectrum antibiotics with vancomycin and cefepime.  General surgery consulted. They did not think it was necessary do any surgical intervention CT chest done on 02/22/21 showsModerate-sized right pleural effusion with overlying atelectasis. 2. Peribronchial thickening and bilateral lower lobe airspace consolidation likely pneumonia. Aspiration would certainly be a consideration given the appearance of the esophagus.3. Stable markedly dilated esophagus suggesting achalasia. Pt is currently in ICU and I am asked to see her for fever. She is currently on levaquin    Antibiotics Cefepime 12/26-1/2 Metronidazole 12/26-1/2 Vanco 12/26-1/2 Minocycline 1/2-1/5 Piptazo 1/5 one dose Levaquin 1/5>>  Past Medical History:  Diagnosis Date   Breast cancer, right ([place]) 2002   Tx'd with chemotherapy and XRT   Chronic respiratory failure ([place])    s/p tracheostomy in 2012; on ventilatory support at hs   Diabetic peripheral neuropathy ([place]) 08/28/2016   GERD (gastroesophageal reflux disease)    Glaucoma    High cholesterol     History of colon polyps    Osteoarthritis    Personal history of chemotherapy 2002   BREAST CA   Personal history of radiation therapy 2002   BREAST CA   S/P percutaneous endoscopic gastrostomy (PEG) tube placement ([place])    T2DM (type 2 diabetes mellitus) ([place])    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   Ventral hernia    Vocal cord paralysis 2012    Past Surgical History:  Procedure Laterality Date   ABDOMINAL HYSTERECTOMY     BREAST BIOPSY Right 2008   neg   BREAST BIOPSY Right 06/30/2020   "Q" clip-path pending   BREAST EXCISIONAL BIOPSY Right 2002   positive   BREAST LUMPECTOMY Right 2002   chemo and radiation   BREAST LUMPECTOMY WITH RADIOFREQUENCY TAG IDENTIFICATION Right 10/18/2020   Procedure: BREAST LUMPECTOMY WITH RADIOFREQUENCY TAG IDENTIFICATION;  Surgeon: Jules Husbands, MD;  Location: [facility] ORS;  Service: General;  Laterality: Right;   BREAST SURGERY Right 2002   LUMPECTOMY, radiation, chemotherapy   CATARACT EXTRACTION, BILATERAL     CHOLECYSTECTOMY N/A 12/10/2017   Procedure: LAPAROSCOPIC CHOLECYSTECTOMY;  Surgeon: Benjamine Sprague, DO;  Location: [facility] ORS;  Service: General;  Laterality: N/A;   COLON RESECTION Right 11/06/2018   Procedure: HAND ASSISTED LAPAROSCOPIC COLON RESECTION, RIGHT;  Surgeon: Jules Husbands, MD;  Location: [facility] ORS;  Service: General;  Laterality: Right;   COLONOSCOPY  2009,12/30/2013   COLONOSCOPY WITH PROPOFOL N/A 04/16/2018   Tubulovillous polyp proximal transverse colon, large sessile polyp, proximal transverse colon;  Surgeon: Robert Bellow, MD;  Location: [facility] ENDOSCOPY;  Service: Endoscopy;  Laterality: N/A;   ESOPHAGOGASTRODUODENOSCOPY (EGD) WITH  PROPOFOL N/A 10/14/2018   Procedure: ESOPHAGOGASTRODUODENOSCOPY (EGD) WITH PROPOFOL;  Surgeon: Lucilla Lame, MD;  Location: [facility] ENDOSCOPY;  Service: Endoscopy;  Laterality: N/A;   TRACHEAL SURGERY  2013   TRACHEOSTOMY N/A    Dr Kathyrn Sheriff   XI ROBOTIC ASSISTED VENTRAL HERNIA N/A 10/18/2020    Procedure: XI ROBOTIC ASSISTED VENTRAL HERNIA;  Surgeon: Jules Husbands, MD;  Location: [facility] ORS;  Service: General;  Laterality: N/A;    Social History   Socioeconomic History   Marital status: Married    Spouse name: [name]   Number of children: Not on file   Years of education: Not on file   Highest education level: Not on file  Occupational History   Not on file  Tobacco Use   Smoking status: Never   Smokeless tobacco: Never  Vaping Use   Vaping Use: Never used  Substance and Sexual Activity   Alcohol use: No   Drug use: No   Sexual activity: Not on file  Other Topics Concern   Not on file  Social History Narrative   Lives with husband   Right Handed   Drinks 2-3 cups caffeine daily   Social Determinants of Health   Financial Resource Strain: Not on file  Food Insecurity: Not on file  Transportation Needs: Not on file  Physical Activity: Not on file  Stress: Not on file  Social Connections: Not on file  Intimate Partner Violence: Not on file    Family History  Problem Relation Age of Onset   Cancer Father        bone cancer   Cancer Sister        lymphoma   Cancer Brother    Breast cancer Neg Hx    Allergies  Allergen Reactions   Shellfish Allergy Shortness Of Breath    respitatory distress    Sodium Sulfite Shortness Of Breath    Severe Congestion that creates inability to breath   Sulfa Antibiotics Shortness Of Breath and Other (See Comments)    respitatory distress   Codeine Other (See Comments)    Per patient "it kept her up all night"   Glucerna [Alimentum] Itching   Wound Dressing Adhesive Rash   I? Current Facility-Administered Medications  Medication Dose Route Frequency Provider Last Rate Last Admin   acetaminophen (TYLENOL) suppository 650 mg  650 mg Rectal Q4H PRN Ralene Muskrat B, MD       acetaminophen (TYLENOL) tablet 650 mg  650 mg Per Tube Q6H PRN Benita Gutter, RPH   650 mg at 03/02/21 0355   albumin human 25 % solution 12.5  g  12.5 g Intravenous Q1200 Ottie Glazier, MD   Stopped at 03/03/21 1308   busPIRone (BUSPAR) tablet 15 mg  15 mg Per Tube BID Flora Lipps, MD   15 mg at 03/03/21 1022   chlorhexidine gluconate (MEDLINE KIT) (PERIDEX) 0.12 % solution 15 mL  15 mL Mouth Rinse BID Flora Lipps, MD   15 mL at 03/03/21 2041   Chlorhexidine Gluconate Cloth 2 % PADS 6 each  6 each Topical Q0600 [place] Eric J, DO   6 each at 03/03/21 1022   docusate (COLACE) 50 MG/5ML liquid 100 mg  100 mg Per Tube Daily PRN Flora Lipps, MD   100 mg at 02/24/21 1034   enoxaparin (LOVENOX) injection 40 mg  40 mg Subcutaneous QHS Ottie Glazier, MD   40 mg at 03/02/21 2150   escitalopram (LEXAPRO) tablet 30 mg  30 mg Per Tube  Daily Imagene Sheller S, DO   30 mg at 03/03/21 1021   feeding supplement (KATE FARMS STANDARD 1.4) liquid 1,440 mL  1,440 mL Per Tube Daily P[place]on, [place] F, MD   1,440 mL at 35/57/32 2025   folic acid (FOLVITE) tablet 1 mg  1 mg Per Tube Daily Imagene Sheller S, DO   1 mg at 03/03/21 1021   free water 50 mL  50 mL Per Tube Q4H Ottie Glazier, MD   50 mL at 03/03/21 2000   insulin aspart (novoLOG) injection 0-15 Units  0-15 Units Subcutaneous Q4H Ralene Muskrat B, MD   5 Units at 03/03/21 2037   insulin detemir (LEVEMIR) injection 8 Units  8 Units Subcutaneous BID Rust-Chester, Huel Cote, NP   8 Units at 03/03/21 1022   ipratropium-albuterol (DUONEB) 0.5-2.5 (3) MG/3ML nebulizer solution 3 mL  3 mL Nebulization Q2H PRN Imagene Sheller S, DO       ipratropium-albuterol (DUONEB) 0.5-2.5 (3) MG/3ML nebulizer solution 3 mL  3 mL Nebulization TID Ottie Glazier, MD   3 mL at 03/03/21 1955   levofloxacin (LEVAQUIN) IVPB 750 mg  750 mg Intravenous Q48H Ottie Glazier, MD   Stopped at 03/02/21 1506   loratadine (CLARITIN) tablet 10 mg  10 mg Per Tube Daily Benita Gutter, RPH   10 mg at 03/03/21 1021   MEDLINE mouth rinse  15 mL Mouth Rinse 10 times per day Flora Lipps, MD   15 mL at 03/03/21 1810   melatonin tablet 5  mg  5 mg Per Tube QHS Lockie Mola B, RPH   5 mg at 03/02/21 2148   metoprolol tartrate (LOPRESSOR) injection 5 mg  5 mg Intravenous Q6H PRN Rust-Chester, Britton L, NP   5 mg at 03/03/21 1041   metoprolol tartrate (LOPRESSOR) tablet 25 mg  25 mg Per Tube Daily Imagene Sheller S, DO   25 mg at 03/03/21 1021   multivitamin liquid 15 mL  15 mL Per Tube Daily Ottie Glazier, MD   15 mL at 03/03/21 1022   pantoprazole sodium (PROTONIX) 40 mg/20 mL oral suspension 40 mg  40 mg Per Tube Daily Chinita Greenland A, RPH   40 mg at 03/03/21 0807   pravastatin (PRAVACHOL) tablet 20 mg  20 mg Per Tube q1800 Ralene Muskrat B, MD   20 mg at 03/03/21 1809   rOPINIRole (REQUIP) tablet 0.5 mg  0.5 mg Per Tube QHS Imagene Sheller S, DO   0.5 mg at 03/02/21 2149     Abtx:  Anti-infectives (From admission, onward)    Start     Dose/Rate Route Frequency Ordered Stop   03/02/21 1200  levofloxacin (LEVAQUIN) IVPB 750 mg        750 mg 100 mL/hr over 90 Minutes Intravenous Every 48 hours 03/02/21 1100     03/02/21 1130  piperacillin-tazobactam (ZOSYN) IVPB 3.375 g  Status:  Discontinued        3.375 g 12.5 mL/hr over 240 Minutes Intravenous Every 8 hours 03/02/21 1036 03/02/21 1100   02/27/21 1200  minocycline (MINOCIN) capsule 200 mg  Status:  Discontinued        200 mg Per Tube 2 times daily 02/27/21 1101 03/02/21 1036   02/27/21 1000  vancomycin (VANCOCIN) IVPB 1000 mg/200 mL premix  Status:  Discontinued        1,000 mg 200 mL/hr over 60 Minutes Intravenous Every 24 hours 02/26/21 0849 02/27/21 1052   02/26/21 1000  vancomycin (VANCOREADY) IVPB 1500 mg/300 mL  1,500 mg 150 mL/hr over 120 Minutes Intravenous  Once 02/26/21 0849 02/26/21 1508   02/24/21 2130  metroNIDAZOLE (FLAGYL) IVPB 500 mg  Status:  Discontinued        500 mg 100 mL/hr over 60 Minutes Intravenous Every 12 hours 02/24/21 2032 02/27/21 1055   02/22/21 2100  vancomycin (VANCOREADY) IVPB 1250 mg/250 mL  Status:  Discontinued         1,250 mg 166.7 mL/hr over 90 Minutes Intravenous Every 24 hours 02/22/21 1811 02/24/21 1029   02/21/21 2200  vancomycin (VANCOCIN) IVPB 1000 mg/200 mL premix  Status:  Discontinued        1,000 mg 200 mL/hr over 60 Minutes Intravenous Every 24 hours 02/20/21 1619 02/21/21 1240   02/21/21 2200  vancomycin (VANCOREADY) IVPB 1250 mg/250 mL  Status:  Discontinued        1,250 mg 166.7 mL/hr over 90 Minutes Intravenous Every 24 hours 02/21/21 1240 02/22/21 1051   02/21/21 0200  ceFEPIme (MAXIPIME) 2 g in sodium chloride 0.9 % 100 mL IVPB  Status:  Discontinued        2 g 200 mL/hr over 30 Minutes Intravenous Every 12 hours 02/20/21 1618 02/27/21 1055   02/20/21 1700  vancomycin (VANCOREADY) IVPB 1500 mg/300 mL        1,500 mg 150 mL/hr over 120 Minutes Intravenous  Once 02/20/21 1618 02/21/21 0118   02/20/21 1345  ceFEPIme (MAXIPIME) 2 g in sodium chloride 0.9 % 100 mL IVPB        2 g 200 mL/hr over 30 Minutes Intravenous  Once 02/20/21 1344 02/20/21 1632   02/20/21 1345  metroNIDAZOLE (FLAGYL) IVPB 500 mg        500 mg 100 mL/hr over 60 Minutes Intravenous  Once 02/20/21 1344 02/20/21 1932       REVIEW OF SYSTEMS: NA Objective:  VITALS:  Patient Vitals for the past 24 hrs:  BP Temp Temp src Pulse Resp SpO2 Weight  03/03/21 1900 131/64 -- -- (!) 108 19 96 % --  03/03/21 1800 131/63 -- -- (!) 109 (!) 23 95 % --  03/03/21 1700 (!) 131/58 99.1 F (37.3 C) Axillary (!) 108 (!) 21 98 % --  03/03/21 1600 120/60 -- -- (!) 101 20 99 % --  03/03/21 1523 -- -- -- -- -- 98 % --  03/03/21 1500 118/61 -- -- 100 15 98 % --  03/03/21 1400 (!) 112/58 -- -- 98 14 94 % --  03/03/21 1300 (!) 107/59 -- -- 98 14 96 % --  03/03/21 1200 101/60 -- -- 98 14 99 % --  03/03/21 1100 (!) 121/55 -- -- (!) 105 14 95 % --  03/03/21 1000 137/60 -- -- (!) 120 18 95 % --  03/03/21 0900 129/63 -- -- (!) 118 18 94 % --  03/03/21 0800 132/62 98.9 F (37.2 C) Axillary (!) 116 16 93 % --  03/03/21 0749 -- -- -- -- --  94 % --  03/03/21 0700 (!) 126/55 -- -- (!) 117 (!) 24 95 % --  03/03/21 0600 (!) 130/58 -- -- (!) 120 18 98 % --  03/03/21 0500 130/65 -- -- (!) 115 15 92 % 71.4 kg  03/03/21 0400 (!) 126/57 100.1 F (37.8 C) Axillary (!) 116 14 96 % --  03/03/21 0300 125/61 -- -- (!) 114 14 96 % --  03/03/21 0200 (!) 99/51 -- -- (!) 116 18 97 % --  03/03/21 0100 (!) 126/56 -- -- Marland Kitchen  112 18 95 % --  03/03/21 0000 (!) 121/59 100.2 F (37.9 C) Axillary (!) 109 19 96 % --  03/02/21 2300 113/65 -- -- (!) 109 19 97 % --    PHYSICAL EXAM:  General: Somnolent On calling her name she opens eyes and tries to talk Frail , ill looking Head: Normocephalic, without obvious abnormality, atraumatic. Eyes: Conjunctivae clear, anicteric sclerae. Pupils are equal ENT Nares normal. No drainage or sinus tenderness. Oral mucosa very dry- lips congealed secretions Neck:  symmetrical, tracheostomy Back: as per her nurse no decub Lungs: b/l air entry. Heart:Tachycardia- 2/6 systolic murmur Abdomen: Soft, peg site okay- no erythema Extremities: atraumatic, no cyanosis. No edema. No clubbing Skin: No rashes Lymph: Cervical, supraclavicular normal. Neurologic: cannot assessl Pertinent Labs Lab Results CBC    Component Value Date/Time   WBC 15.6 (H) 03/03/2021 0947   RBC 4.44 03/03/2021 0947   HGB 11.9 (L) 03/03/2021 0947   HGB 14.4 12/21/2015 1040   HCT 39.5 03/03/2021 0947   HCT 42.4 12/21/2015 1040   PLT 287 03/03/2021 0947   PLT 241 12/21/2015 1040   MCV 89.0 03/03/2021 0947   MCV 87 12/21/2015 1040   MCV 82 12/17/2011 0650   MCH 26.8 03/03/2021 0947   MCHC 30.1 03/03/2021 0947   RDW 13.7 03/03/2021 0947   RDW 12.5 12/21/2015 1040   RDW 13.3 12/17/2011 0650   LYMPHSABS 0.4 (L) 02/20/2021 1137   LYMPHSABS 1.1 12/21/2015 1040   LYMPHSABS 0.9 (L) 12/17/2011 0650   MONOABS 0.6 02/20/2021 1137   MONOABS 0.1 (L) 12/17/2011 0650   EOSABS 0.0 02/20/2021 1137   EOSABS 0.1 12/21/2015 1040   EOSABS 0.0  12/17/2011 0650   BASOSABS 0.0 02/20/2021 1137   BASOSABS 0.0 12/21/2015 1040   BASOSABS 0.0 12/17/2011 0650    CMP Latest Ref Rng & Units 03/02/2021 03/01/2021 02/28/2021  Glucose 70 - 99 mg/dL 212(H) 218(H) 157(H)  BUN 8 - 23 mg/dL 30(H) 24(H) 21  Creatinine 0.44 - 1.00 mg/dL 0.93 0.88 0.93  Sodium 135 - 145 mmol/L 142 139 144  Potassium 3.5 - 5.1 mmol/L 4.2 3.8 4.1  Chloride 98 - 111 mmol/L 97(L) 98 107  CO2 22 - 32 mmol/L 35(H) 31 28  Calcium 8.9 - 10.3 mg/dL 9.2 9.0 8.8(L)  Total Protein 6.5 - 8.1 g/dL - - -  Total Bilirubin 0.3 - 1.2 mg/dL - - -  Alkaline Phos 38 - 126 U/L - - -  AST 15 - 41 U/L - - -  ALT 0 - 44 U/L - - -      Microbiology: [facility] 12/26,12/27,12/28, 1/1  NG  02/24/21 tracheal aspirate stenotrophomonas and flavobacterium Recent Results (from the past 240 hour(s))  Culture, blood (Routine X 2) w Reflex to ID Panel     Status: None   Collection Time: 02/21/21  9:46 PM   Specimen: BLOOD  Result Value Ref Range Status   Specimen Description BLOOD LEFT HAND  Final   Special Requests   Final    BOTTLES DRAWN AEROBIC AND ANAEROBIC Blood Culture adequate volume   Culture   Final    NO GROWTH 5 DAYS Performed at [facility], [address] [place], [place] [address]    Report Status 02/26/2021 FINAL  Final  CULTURE, BLOOD (ROUTINE X 2) w Reflex to ID Panel     Status: None   Collection Time: 02/22/21 11:51 AM   Specimen: BLOOD  Result Value Ref Range Status   Specimen Description  BLOOD BLOOD LEFT HAND  Final   Special Requests   Final    BOTTLES DRAWN AEROBIC AND ANAEROBIC Blood Culture adequate volume   Culture   Final    NO GROWTH 6 DAYS Performed at [facility], [address] [place], [place] [address]    Report Status 02/28/2021 FINAL  Final  CULTURE, BLOOD (ROUTINE X 2) w Reflex to ID Panel     Status: None   Collection Time: 02/22/21 11:53 AM   Specimen: BLOOD  Result Value Ref Range Status   Specimen Description BLOOD BLOOD   Final   Special Requests   Final    BOTTLES DRAWN AEROBIC AND ANAEROBIC Blood Culture adequate volume   Culture   Final    NO GROWTH 6 DAYS Performed at [facility], [address], [place], [place] [address]    Report Status 02/28/2021 FINAL  Final  Culture, Respiratory w Gram Stain     Status: None   Collection Time: 02/24/21  9:00 PM   Specimen: Tracheal Aspirate; Respiratory  Result Value Ref Range Status   Specimen Description   Final    TRACHEAL ASPIRATE Performed at [facility], [address], [place], [place] [address]    Special Requests   Final    NONE Performed at [facility], [place], [place] [address]    Gram Stain   Final    NO SQUAMOUS EPITHELIAL CELLS SEEN FEW WBC SEEN NO ORGANISMS SEEN Performed at [facility], [place] [address], [place], [place] [address]    Culture   Final    FEW STENOTROPHOMONAS MALTOPHILIA FEW FLAVOBACTERIUM INDOLOGENES    Report Status 02/28/2021 FINAL  Final   Organism ID, Bacteria STENOTROPHOMONAS MALTOPHILIA  Final   Organism ID, Bacteria FLAVOBACTERIUM INDOLOGENES  Final      Susceptibility   Stenotrophomonas maltophilia - MIC*    LEVOFLOXACIN 0.25 SENSITIVE Sensitive     TRIMETH/SULFA <=20 SENSITIVE Sensitive     * FEW STENOTROPHOMONAS MALTOPHILIA   Flavobacterium indologenes - MIC*    CEFAZOLIN >=64 RESISTANT Resistant     CEFTAZIDIME >=64 RESISTANT Resistant     CIPROFLOXACIN 1 SENSITIVE Sensitive     GENTAMICIN 8 INTERMEDIATE Intermediate     IMIPENEM >=16 RESISTANT Resistant     TRIMETH/SULFA <=20 SENSITIVE Sensitive     PIP/TAZO 8 SENSITIVE Sensitive     * FEW FLAVOBACTERIUM INDOLOGENES  MRSA Next Gen by PCR, Nasal     Status: None   Collection Time: 02/26/21  8:27 AM   Specimen: Nasal Mucosa; Nasal Swab  Result Value Ref Range Status   MRSA by PCR Next Gen NOT DETECTED NOT DETECTED Final    Comment: (NOTE) The GeneXpert MRSA Assay (FDA approved for NASAL  specimens only), is one component of a comprehensive MRSA colonization surveillance program. It is not intended to diagnose MRSA infection nor to guide or monitor treatment for MRSA infections. Test performance is not FDA approved in patients less than 49 years old. Performed at [facility], [address] [place],  [address]   CULTURE, BLOOD (ROUTINE X 2) w Reflex to ID Panel     Status: None   Collection Time: 02/26/21  9:04 AM   Specimen: BLOOD  Result Value Ref Range Status   Specimen Description BLOOD BLOOD RIGHT HAND  Final   Special Requests   Final    BOTTLES DRAWN AEROBIC AND ANAEROBIC Blood Culture adequate volume   Culture  Final    NO GROWTH 5 DAYS Performed at [facility], [address] [place], [place] [address]    Report Status 03/03/2021 FINAL  Final  CULTURE, BLOOD (ROUTINE X 2) w Reflex to ID Panel     Status: None   Collection Time: 02/26/21  9:04 AM   Specimen: BLOOD  Result Value Ref Range Status   Specimen Description BLOOD RIGHT ANTECUBITAL  Final   Special Requests   Final    BOTTLES DRAWN AEROBIC AND ANAEROBIC Blood Culture adequate volume   Culture   Final    NO GROWTH 5 DAYS Performed at [facility], [address] [place], [place] [address]    Report Status 03/03/2021 FINAL  Final  Urine Culture     Status: None   Collection Time: 02/26/21  5:21 PM   Specimen: Urine, Clean Catch  Result Value Ref Range Status   Specimen Description   Final    URINE, CLEAN CATCH Performed at [facility], [address] [place], [place] [address]    Special Requests   Final    NONE Performed at [facility], [address] [place], [place] [address]    Culture   Final    NO GROWTH Performed at [facility], [place] [address] [place], [place] [address]    Report Status 02/28/2021 FINAL  Final  Culture, Respiratory w Gram Stain     Status: None (Preliminary result)   Collection Time:  03/02/21 11:53 AM   Specimen: Tracheal Aspirate; Respiratory  Result Value Ref Range Status   Specimen Description   Final    TRACHEAL ASPIRATE Performed at [facility] [address] [place], [place] [address]    Special Requests   Final    NONE Performed at [facility], [address] [place], [place] [address]    Gram Stain   Final    ABUNDANT WBC PRESENT, PREDOMINANTLY PMN FEW GRAM NEGATIVE RODS    Culture   Final    CULTURE REINCUBATED FOR BETTER GROWTH Performed at [facility], [place] [address], [place], [place] [address]    Report Status PENDING  Incomplete    IMAGING RESULTS: CT abdomen 02/26/21 Stable size and appearance of rounded abnormality seen to the left of the umbilicus in the subcutaneous tissues in the anterior abdominal wall which may represent small abscess as noted on prior I have personally reviewed the films CT chest 02/22/21  . Moderate-sized right pleural effusion with overlying atelectasis. 2. Peribronchial thickening and bilateral lower lobe airspace consolidation likely pneumonia. Aspiration would certainly be a consideration given the appearance of the esophagus. 3. Stable markedly dilated esophagus suggesting achalasia   ?Improved aeration at the right lung base with mild curvilinear bibasilar scarring versus atelectasis. Chronic moderate elevation of the right hemidiaphragm. No acute consolidative airspace disease to suggest a pneumonia.   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 Laryngospasm /chronic resp failure since 2012 PEG H/o Breast  Ca in 2002 Recent biopsy in 09/2020 Negative ?? ___________________________________________________ Discussed with her nurse Note:  This document was prepared using Dragon voice recognition software and may include unintentional dictation errors.

## 2021-03-03 NOTE — Progress Notes (Signed)
Inpatient Diabetes Program Recommendations  AACE/ADA: New Consensus Statement on Inpatient Glycemic Control (2015)  Target Ranges:  Prepandial:   less than 140 mg/dL      Peak postprandial:   less than 180 mg/dL (1-2 hours)      Critically ill patients:  140 - 180 mg/dL   Lab Results  Component Value Date   GLUCAP 183 (H) 03/03/2021   HGBA1C 6.3 (H) 01/20/2020    Review of Glycemic Control  Latest Reference Range & Units 03/02/21 07:42 03/02/21 11:39 03/02/21 16:22 03/02/21 20:06 03/03/21 01:03 03/03/21 04:38 03/03/21 07:15 03/03/21 11:03  Glucose-Capillary 70 - 99 mg/dL 233 (H) 246 (H) 226 (H) 226 (H) 193 (H) 264 (H) 219 (H) 183 (H)  (H): Data is abnormally high Diabetes history: DM2 Outpatient Diabetes medications: Metformin 1000 mg QD Current orders for Inpatient glycemic control: Novolog 0-15 units Q4H, Levemir 8 BID   Inpatient Diabetes Program Recommendations:     Levemir 10 units BID  Thank you, Bethena Roys E. Brynleigh Sequeira, RN, MSN, CDE  Diabetes Coordinator Inpatient Glycemic Control Team Team Pager [phone] (8am-5pm) 03/03/2021 12:17 PM

## 2021-03-03 NOTE — Progress Notes (Signed)
Physical Therapy Treatment Patient Details Name: Angela David MRN: [id number] DOB: Mar 01, 1940 Today's Date: 03/03/2021   History of Present Illness 81 y.o with significant PMH of remote breast cancer in 2002 s/p chemo and radiation, DM2, ventral hernia s/p robotic incisional hernia repair and right upper outer quadrant mass s/p RFA guided right lumpectomy (09/2020), peripheral neuropathy, GERD, vocal cord paralysis s/p chronic trach and PEG who presented to the ED with chief complaints of fevers, chills and SOB.    PT Comments    Pt seen for PT tx with OT for 2nd person assistance. Pt requires dependent +2 assistance for supine<>sit with pt lethargic throughout. Pt requires total assist for static sitting EOB with OT facilitating LOB posteriorly with pt briefly, partially opening eyes before closing them again. Tried to increase alertness with cold wet cloth on forehead/head but to no avail. PT/OT could not even get pt to arouse with painful stimuli. Pt assisted back supine & left with all needs in reach. Of note, vent tubing disconnected with PT/OT reconnecting it immediately, SpO2 100% at end of session.    Recommendations for follow up therapy are one component of a multi-disciplinary discharge planning process, led by the attending physician.  Recommendations may be updated based on patient status, additional functional criteria and insurance authorization.  Follow Up Recommendations  Skilled nursing-short term rehab (<3 hours/day)     Assistance Recommended at Discharge Frequent or constant Supervision/Assistance  Patient can return home with the following  (with +2 total care of all aspects of care)   Equipment Recommendations   (TBD in next venue)    Recommendations for Other Services       Precautions / Restrictions Precautions Precautions: Fall Restrictions Weight Bearing Restrictions: No     Mobility  Bed Mobility Overal bed mobility: Needs Assistance Bed  Mobility: Supine to Sit;Sit to Supine     Supine to sit: Total assist;+2 for physical assistance;+2 for safety/equipment Sit to supine: Total assist;+2 for physical assistance;+2 for safety/equipment        Transfers                        Ambulation/Gait                   Stairs             Wheelchair Mobility    Modified Rankin (Stroke Patients Only)       Balance Overall balance assessment: Needs assistance Sitting-balance support: Feet unsupported;Bilateral upper extremity supported Sitting balance-Leahy Scale: Zero                                      Cognition Arousal/Alertness: Lethargic                                     General Comments: Pt lethargic, only barely opening eyes when OT facilitates posterior LOB.        Exercises      General Comments        Pertinent Vitals/Pain Pain Assessment: Faces Pain Location: pt grimaced when PT/OT pinched webbing between fingers/toes x 1 time, otherwise no response to pain/no behaviors demonstrating pain Pain Descriptors / Indicators: Grimacing    Home Living  Prior Function            PT Goals (current goals can now be found in the care plan section) Acute Rehab PT Goals Patient Stated Goal: unable to state PT Goal Formulation: Patient unable to participate in goal setting Time For Goal Achievement: 03/10/21 Potential to Achieve Goals: Fair Progress towards PT goals: PT to reassess next treatment    Frequency    Min 2X/week      PT Plan Current plan remains appropriate    Co-evaluation              AM-PAC PT "6 Clicks" Mobility   Outcome Measure  Help needed turning from your back to your side while in a flat bed without using bedrails?: Total Help needed moving from lying on your back to sitting on the side of a flat bed without using bedrails?: Total Help needed moving to and from a bed to  a chair (including a wheelchair)?: Total Help needed standing up from a chair using your arms (e.g., wheelchair or bedside chair)?: Total Help needed to walk in hospital room?: Total Help needed climbing 3-5 steps with a railing? : Total 6 Click Score: 6    End of Session Equipment Utilized During Treatment:  (vent) Activity Tolerance: Patient limited by lethargy Patient left: in bed;with call bell/phone within reach;with bed alarm set Nurse Communication:  (behavior during session) PT Visit Diagnosis: Unsteadiness on feet (R26.81);Muscle weakness (generalized) (M62.81);Difficulty in walking, not elsewhere classified (R26.2)     Time: 5732-2025 PT Time Calculation (min) (ACUTE ONLY): 14 min  Charges:  $Therapeutic Activity: 8-22 mins                     Lavone Nian, PT, DPT 03/03/21, 12:02 PM    Waunita Schooner 03/03/2021, 11:58 AM

## 2021-03-04 DIAGNOSIS — J09X1 Influenza due to identified novel influenza A virus with pneumonia: Secondary | ICD-10-CM | POA: Diagnosis not present

## 2021-03-04 LAB — CBC
HCT: 39.2 % (ref 36.0–46.0)
Hemoglobin: 12.1 g/dL (ref 12.0–15.0)
MCH: 27.3 pg (ref 26.0–34.0)
MCHC: 30.9 g/dL (ref 30.0–36.0)
MCV: 88.3 fL (ref 80.0–100.0)
Platelets: 295 10*3/uL (ref 150–400)
RBC: 4.44 MIL/uL (ref 3.87–5.11)
RDW: 13.6 % (ref 11.5–15.5)
WBC: 15.2 10*3/uL — ABNORMAL HIGH (ref 4.0–10.5)
nRBC: 0 % (ref 0.0–0.2)

## 2021-03-04 LAB — GLUCOSE, CAPILLARY
Glucose-Capillary: 276 mg/dL — ABNORMAL HIGH (ref 70–99)
Glucose-Capillary: 256 mg/dL — ABNORMAL HIGH (ref 70–99)
Glucose-Capillary: 234 mg/dL — ABNORMAL HIGH (ref 70–99)
Glucose-Capillary: 140 mg/dL — ABNORMAL HIGH (ref 70–99)
Glucose-Capillary: 155 mg/dL — ABNORMAL HIGH (ref 70–99)
Glucose-Capillary: 156 mg/dL — ABNORMAL HIGH (ref 70–99)

## 2021-03-04 LAB — BLOOD GAS, ARTERIAL
Acid-Base Excess: 17.6 mmol/L — ABNORMAL HIGH (ref 0.0–2.0)
Bicarbonate: 36.9 mmol/L — ABNORMAL HIGH (ref 20.0–28.0)
FIO2: 0.28
MECHVT: 440 mL
O2 Saturation: 99.8 %
PEEP: 5 cmH2O
Patient temperature: 37
RATE: 14 resp/min
pCO2 arterial: 26 mmHg — ABNORMAL LOW (ref 32.0–48.0)
pH, Arterial: >7.75 (ref 7.350–7.450)
pO2, Arterial: 174 mmHg — ABNORMAL HIGH (ref 83.0–108.0)

## 2021-03-04 LAB — BASIC METABOLIC PANEL
Anion gap: 10 (ref 5–15)
BUN: 36 mg/dL — ABNORMAL HIGH (ref 8–23)
CO2: 36 mmol/L — ABNORMAL HIGH (ref 22–32)
Calcium: 9.9 mg/dL (ref 8.9–10.3)
Chloride: 96 mmol/L — ABNORMAL LOW (ref 98–111)
Creatinine, Ser: 0.8 mg/dL (ref 0.44–1.00)
GFR, Estimated: >60 mL/min (ref >60)
Glucose, Bld: 294 mg/dL — ABNORMAL HIGH (ref 70–99)
Potassium: 4.2 mmol/L (ref 3.5–5.1)
Sodium: 142 mmol/L (ref 135–145)

## 2021-03-04 LAB — PHOSPHORUS: Phosphorus: 2.4 mg/dL — ABNORMAL LOW (ref 2.5–4.6)

## 2021-03-04 LAB — MAGNESIUM: Magnesium: 2.1 mg/dL (ref 1.7–2.4)

## 2021-03-04 LAB — CORTISOL-AM, BLOOD: Cortisol - AM: 22.6 ug/dL (ref 6.7–22.6)

## 2021-03-04 MED ORDER — LEVOFLOXACIN IN D5W 750 MG/150ML IV SOLN
750.0000 mg | INTRAVENOUS | Status: DC
  Administered 2021-03-04: 750 mg via INTRAVENOUS
  Filled 2021-03-04: qty 150

## 2021-03-04 MED ORDER — KATE FARMS STANDARD 1.4 PO LIQD
1440.0000 mL | Freq: Every day | ORAL | Status: DC
  Administered 2021-03-05 – 2021-03-06 (×2): 1440 mL
  Filled 2021-03-04: qty 1625

## 2021-03-04 NOTE — Progress Notes (Signed)
Date of Admission:  02/20/2021    ID: Angela David is a 81 y.o. female  Principal Problem:   Depression due to physical illness Active Problems:   Sepsis ([place])   Acute pulmonary edema ([facility])   Somnolence  Antibiotics Cefepime 12/26-1/2 Metronidazole 12/26-1/2 Vanco 12/26-1/2 Minocycline 1/2-1/5 Piptazo 1/5 one dose Levaquin 1/5>>03/04/21 Tamiflu !/6>>  Subjective: No ROS available   Medications:   busPIRone  15 mg Per Tube BID   chlorhexidine gluconate (MEDLINE KIT)  15 mL Mouth Rinse BID   Chlorhexidine Gluconate Cloth  6 each Topical Q0600   enoxaparin (LOVENOX) injection  40 mg Subcutaneous QHS   escitalopram  30 mg Per Tube Daily   [START ON 03/05/2021] feeding supplement (KATE [employer]MS STANDARD 1.4)  1,440 mL Per Tube Daily   folic acid  1 mg Per Tube Daily   free water  50 mL Per Tube Q4H   insulin aspart  0-20 Units Subcutaneous Q4H   insulin detemir  10 Units Subcutaneous BID   ipratropium-albuterol  3 mL Nebulization TID   loratadine  10 mg Per Tube Daily   mouth rinse  15 mL Mouth Rinse 10 times per day   melatonin  5 mg Per Tube QHS   metoprolol tartrate  25 mg Per Tube Daily   multivitamin  15 mL Per Tube Daily   oseltamivir  30 mg Per Tube BID   pantoprazole sodium  40 mg Per Tube Daily   pravastatin  20 mg Per Tube q1800    Objective: Vital signs in last 24 hours: Patient Vitals for the past 24 hrs:  BP Temp Temp src Pulse Resp SpO2 Weight  03/04/21 1800 134/69 -- -- (!) 103 (!) 24 97 % --  03/04/21 1700 121/60 -- -- 100 (!) 21 97 % --  03/04/21 1600 (!) 126/54 -- -- 96 15 98 % --  03/04/21 1500 (!) 128/57 -- -- 96 (!) 21 97 % --  03/04/21 1400 114/64 -- -- (!) 101 18 96 % --  03/04/21 1300 (!) 119/59 -- -- 97 16 97 % --  03/04/21 1200 114/61 -- -- 99 19 96 % --  03/04/21 1100 117/60 -- -- 93 14 98 % --  03/04/21 1000 133/62 -- -- 87 15 97 % --  03/04/21 0900 (!) 115/51 -- -- 90 14 97 % --  03/04/21 0800 (!) 146/68 99.2 F (37.3 C) Oral (!)  112 (!) 24 98 % --  03/04/21 0739 -- -- -- -- -- 98 % --  03/04/21 0700 (!) 147/70 -- -- (!) 111 19 99 % --  03/04/21 0600 (!) 149/68 -- -- (!) 111 18 100 % --  03/04/21 0500 (!) 143/69 -- -- (!) 110 17 99 % --  03/04/21 0448 -- -- -- -- -- -- 71.8 kg  03/04/21 0400 139/62 99.4 F (37.4 C) Oral (!) 113 17 99 % --  03/04/21 0300 (!) 142/70 -- -- (!) 111 15 99 % --  03/04/21 0230 -- -- -- (!) 110 17 99 % --  03/04/21 0200 136/65 -- -- (!) 107 16 99 % --  03/04/21 0100 136/67 -- -- (!) 107 17 99 % --  03/04/21 0000 132/60 99 F (37.2 C) Oral (!) 106 17 99 % --  03/03/21 2300 (!) 127/55 -- -- (!) 108 18 99 % --  03/03/21 2200 133/66 -- -- (!) 111 (!) 22 99 % --  03/03/21 2100 131/61 -- -- (!) 112 19 98 % --  03/03/21 2000 134/61 99.2 F (37.3 C) Oral (!) 119 (!) 21 97 % --  03/03/21 1900 131/64 -- -- (!) 108 19 96 % --     PHYSICAL EXAM:  General: Somnolent Frail , ill looking Neck:  symmetrical, tracheostomy on the vent Back: as per her nurse no decub Lungs: b/l air entry. Heart:Tachycardia- 2/6 systolic murmur Abdomen: Soft, peg site okay- no erythema Extremities: atraumatic, no cyanosis. No edema. No clubbing Skin: No rashes Lymph: Cervical, supraclavicular normal. 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 Laryngospasm /chronic resp failure since 2012 PEG H/o Breast Ca in 2002 Recent biopsy in 09/2020 Negative  Discussed the management with Dr.Aleskerov ID will sign off- call if needed

## 2021-03-04 NOTE — Progress Notes (Signed)
PHARMACY NOTE:  ANTIMICROBIAL RENAL DOSAGE ADJUSTMENT  Current antimicrobial regimen includes a mismatch between antimicrobial dosage and estimated renal function.  As per policy approved by the Pharmacy & Therapeutics and Medical Executive Committees, the antimicrobial dosage will be adjusted accordingly.  Current antimicrobial dosage:  Levofloxacin 750 mg Q48H  Indication: HCAP  Renal Function:  Estimated Creatinine Clearance: 56.3 mL/min (by C-G formula based on SCr of 0.8 mg/dL).     Antimicrobial dosage has been changed to:  Levofloxacin Q24H  Additional comments:   Thank you for allowing pharmacy to be a part of this patient's care.  Dorothe Pea, PharmD, BCPS Clinical Pharmacist   03/04/2021 7:05 AM

## 2021-03-04 NOTE — Progress Notes (Signed)
Attempted visit with this familiar patient from hospitalization in 2021. She did not respond to my attempts to wake her. Spoke with her nurse and asked to be contacted when husband arrives. Will attempt to visit later today.

## 2021-03-04 NOTE — Progress Notes (Signed)
NAME:  Angela David, MRN:  [id number], DOB:  Mar 19, 1940, LOS: 5 ADMISSION DATE:  02/20/2021, CONSULTATION DATE:  02/20/2021 REFERRING MD: Imagene Sheller, MD. 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   02/27/21- patient remains on ventilator. S/p sugery evaluation of PEG tube. Resp cultures are stenotrophomonas.  She has pneumonia with parapneumonic effusion concerning for possible empyema. [name] at bedside reveals no pleural effusion bilaterally 02/29/20- patient weaned down on FIO2 today , will attemp SBT in am for liberation from MV. Diuresed well today >3L with improved resp status 03/01/21- patient exhibiting signs of severe derpression , she voiced not wanting to continue treatment medically, I have placed consult for palliative care and psychiatry. 03/02/20-  Patient still with what appears to be deprssion. I met with husband today and patient was evaluated by psychiatry today.  03/03/20-  patient seems to have component of pneumonia induced tachycardia and possible septic encephalopathy.  ID consult due to unclear infection status 03/04/20- patient continues to be somnolent and is with bacterial and viral pneumonia with septic enceophalpathy  Pertinent  Medical History   Right Breast Cancer Tx'd with chemotherapy and XRT    Chronic respiratory failure ([place])    s/p tracheostomy in 2012; on ventilatory support at hs  Diabetic peripheral neuropathy ([place]) 08/28/2016  GERD (gastroesophageal reflux disease)     Glaucoma    High cholesterol    History of colon polyps    Osteoarthritis    Personal history of chemotherapy 2002  BREAST CA  Personal history of radiation therapy 2002  BREAST CA  S/P percutaneous endoscopic gastrostomy (PEG) tube placement ([place])    T2DM (type 2 diabetes mellitus) ([place])    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 PCCM consulted for vent management 12/26: General surgery consulted 12/27: Remains on full vent support. 12/28: Remains agitated on the vent. CT head and chest obtained 12/30: Patient with episode of respiratory distress due to suspected aspiration. Bolus feed held.STAT CXR performed which revealed a worsening R pleural effusion. 12/31: Remains on the vent.  Cultures:  01/01 BCx: pending 01/01 UCx: pending 12/28 BCx: NGTD 12/26 BCx: NG final 12/30 RCx: NG final 12/26 MRSA PCR: not detected  12/26 respiratory panel: negative 12/26 SARS CoV-2: negative 12/26 influenza A/B: negative 12/26: Strep pneumo urinary antigen>>negative 12/26: Legionella urinary antigen>>negative   Antimicrobials:  12/26 vancomycin >> 12/29    01/01 >> 12/30 metronidazole >> 12/26 cefepime >> Interim History / Subjective:  -No acute events overnight. -Remains febrile, cultures/test obtained and pending  OBJECTIVE   Blood pressure 121/60, pulse 100, temperature 99.2 F (37.3 C), temperature source Oral, resp. rate (!) 21, height 5' 4.02" (1.626 m), weight 71.8 kg, SpO2 97 %.    Vent Mode: PRVC FiO2 (%):  [28 %-35 %] 28 % Set Rate:  [12 bmp-14 bmp] 12  bmp Vt Set:  [400 mL-440 mL] 400 mL PEEP:  [5 cmH20] 5 cmH20   Intake/Output Summary (Last 24 hours) at 03/04/2021 1805 Last data filed at 03/04/2021  1052 Gross per 24 hour  Intake 150 ml  Output 525 ml  Net -375 ml    Filed Weights   03/02/21 0426 03/03/21 0500 03/04/21 0448  Weight: 73.2 kg 71.4 kg 71.8 kg   Examination: GENERAL:age-year-old patient lying in the bed on the vent via trach EYES: Pupils equal, round, reactive to light and accommodation. No scleral icterus. Extraocular muscles intact.  HEENT: Head atraumatic, normocephalic. Oropharynx and nasopharynx clear.  NECK:  Supple, no jugular venous distention. No thyroid enlargement, no tenderness.  LUNGS: mild rhonchi CARDIOVASCULAR: S1, S2 normal. No murmurs, rubs, or gallops.  ABDOMEN: Soft, nontender, nondistended. Bowel sounds present. No organomegaly or mass. PEG tube in place EXTREMITIES: No pedal edema, cyanosis, or clubbing.  NEUROLOGIC: Cranial nerves II through XII are intact. Muscle strength 5/5 in all extremities. Sensation intact. Gait not checked.  PSYCHIATRIC: somnolent  SKIN: No obvious rash, lesion, or ulcer.   Labs/imaging that I havepersonally reviewed  (right click and "Reselect all SmartList Selections" daily)    Labs   CBC: Recent Labs  Lab 02/28/21 0524 03/01/21 0508 03/02/21 0548 03/03/21 0947 03/04/21 0523  WBC 8.7 10.8* 16.0* 15.6* 15.2*  HGB 11.5* 13.0 12.9 11.9* 12.1  HCT 37.2 41.0 41.4 39.5 39.2  MCV 86.7 85.4 88.5 89.0 88.3  PLT 165 207 280 287 295     Basic Metabolic Panel: Recent Labs  Lab 02/26/21 0414 02/26/21 2016 02/26/21 2225 02/27/21 0456 02/28/21 0524 03/01/21 0508 03/02/21 0548 03/04/21 0523  NA 144  --   --  146* 144 139 142 142  K 3.4*  --    < > 3.6 4.1 3.8 4.2 4.2  CL 102  --   --  106 107 98 97* 96*  CO2 32  --   --  '29 28 31 ' 35* 36*  GLUCOSE 198*  --   --  193* 157* 218* 212* 294*  BUN 36*  --   --  32* 21 24* 30* 36*  CREATININE 1.14*  --   --  0.93 0.93 0.88 0.93 0.80  CALCIUM 8.5*  --   --  8.4* 8.8* 9.0 9.2 9.9  MG 2.0  --   --  1.9 2.2  --   --  2.1  PHOS 1.6* 2.3*  --  2.4* 2.2*  --   --   2.4*   < > = values in this interval not displayed.    GFR: Estimated Creatinine Clearance: 56.3 mL/min (by C-G formula based on SCr of 0.8 mg/dL). Recent Labs  Lab 03/01/21 0508 03/02/21 0548 03/03/21 0947 03/04/21 0523  PROCALCITON  --   --  0.17  --   WBC 10.8* 16.0* 15.6* 15.2*     Liver Function Tests: Recent Labs  Lab 02/26/21 0414 02/27/21 0456  AST 30 27  ALT 26 20  ALKPHOS 77 68  BILITOT 0.8 0.8  PROT 6.8 6.4*  ALBUMIN 2.7* 2.7*    No results for input(s): LIPASE, AMYLASE in the last 168 hours. No results for input(s): AMMONIA in the last 168 hours.  ABG    Component Value Date/Time   PHART >7.750 (HH) 03/04/2021 1039   PCO2ART 26 (L) 03/04/2021 1039   PO2ART 174 (H) 03/04/2021 1039   HCO3 36.9 (H) 03/04/2021 1039   ACIDBASEDEF 0.9 01/19/2020 1024  O2SAT 99.8 03/04/2021 1039      Coagulation Profile: No results for input(s): INR, PROTIME in the last 168 hours.   Cardiac Enzymes: No results for input(s): CKTOTAL, CKMB, CKMBINDEX, TROPONINI in the last 168 hours.  HbA1C: Hgb A1c MFr Bld  Date/Time Value Ref Range Status  01/20/2020 07:19 AM 6.3 (H) 4.8 - 5.6 % Final    Comment:    (NOTE) Pre diabetes:          5.7%-6.4%  Diabetes:              >6.4%  Glycemic control for   <7.0% adults with diabetes   03/21/2019 03:24 AM 6.6 (H) 4.8 - 5.6 % Final    Comment:    (NOTE) Pre diabetes:          5.7%-6.4% Diabetes:              >6.4% Glycemic control for   <7.0% adults with diabetes     CBG: Recent Labs  Lab 03/03/21 2342 03/04/21 0439 03/04/21 0739 03/04/21 1224 03/04/21 1622  GLUCAP 248* 276* 256* 234* 140*     Allergies Allergies  Allergen Reactions   Shellfish Allergy Shortness Of Breath    respitatory distress    Sodium Sulfite Shortness Of Breath    Severe Congestion that creates inability to breath   Sulfa Antibiotics Shortness Of Breath and Other (See Comments)    respitatory distress   Codeine Other (See  Comments)    Per patient "it kept her up all night"   Glucerna [Alimentum] Itching   Wound Dressing Adhesive Rash      Home Medications  Prior to Admission medications   Medication Sig Start Date End Date Taking? Authorizing Provider  acetaminophen (TYLENOL) 500 MG tablet Take 1,000 mg by mouth in the morning and at bedtime.   Yes [provider]  busPIRone (BUSPAR) 15 MG tablet Place 15 mg into feeding tube 2 (two) times daily. 09/26/20  Yes [provider]  carboxymethylcellulose (REFRESH PLUS) 0.5 % SOLN Place 1 drop into both eyes 3 (three) times daily as needed (dry eyes).   Yes [provider]  denosumab (PROLIA) 60 MG/ML SOSY injection Inject 60 mg into the skin every 6 (six) months.   Yes [provider]  EPINEPHrine 0.3 mg/0.3 mL IJ SOAJ injection epinephrine 0.3 mg/0.3 mL injection, auto-injector  Inject 0.3 mLs (0.3 mg dose) into the muscle once as needed for Anaphylaxis for up to 1 dose. 09/10/18  Yes [provider]  escitalopram (LEXAPRO) 10 MG tablet Place 3 tablets (30 mg total) into feeding tube daily. Patient taking differently: Place 10 mg into feeding tube daily. 02/13/20  Yes Kasa, Maretta Bees, MD  escitalopram (LEXAPRO) 20 MG tablet Place 20 mg into feeding tube daily.   Yes [provider]  folic acid (FOLVITE) 1 MG tablet Place 1 tablet (1 mg total) into feeding tube daily. 02/13/20  Yes Flora Lipps, MD  gabapentin (NEURONTIN) 600 MG tablet Place 600 mg into feeding tube 3 (three) times daily.   Yes [provider]  lovastatin (MEVACOR) 20 MG tablet Place 20 mg into feeding tube daily. 06/13/20  Yes [provider]  meclizine (ANTIVERT) 25 MG tablet Take 25 mg by mouth daily. 02/17/21  Yes [provider]  melatonin 5 MG TABS Place 1 tablet (5 mg total) into feeding tube at bedtime. 02/13/20  Yes Flora Lipps, MD  metFORMIN (GLUCOPHAGE) 1000 MG tablet Place 1,000 mg into feeding tube daily  with  breakfast. 06/10/20  Yes [provider]  metoprolol tartrate (LOPRESSOR) 25 MG tablet Place 25 mg into feeding tube daily. 06/13/20  Yes [provider]  Multiple Vitamins-Minerals (HAIR/SKIN/NAILS) TABS Take 1 tablet by mouth daily.   Yes [provider]  [facility] powder Apply topically. 01/18/21  Yes [provider]  pantoprazole (PROTONIX) 40 MG tablet Take 40 mg by mouth daily.   Yes [provider]  rOPINIRole (REQUIP) 0.5 MG tablet Place 1 tablet (0.5 mg total) into feeding tube at bedtime. 02/13/20  Yes Kasa, Maretta Bees, MD  sodium chloride 0.9 % nebulizer solution sodium chloride 0.9 % for nebulization  USE 5 ML BY NEBULIZTION AS NEEDED FOR WHEEZING 07/17/19  Yes [provider]  Scheduled Meds:  busPIRone  15 mg Per Tube BID   chlorhexidine gluconate (MEDLINE KIT)  15 mL Mouth Rinse BID   Chlorhexidine Gluconate Cloth  6 each Topical Q0600   enoxaparin (LOVENOX) injection  40 mg Subcutaneous QHS   escitalopram  30 mg Per Tube Daily   [START ON 03/05/2021] feeding supplement ([employer] STANDARD 1.4)  1,440 mL Per Tube Daily   folic acid  1 mg Per Tube Daily   free water  50 mL Per Tube Q4H   insulin aspart  0-20 Units Subcutaneous Q4H   insulin detemir  10 Units Subcutaneous BID   ipratropium-albuterol  3 mL Nebulization TID   loratadine  10 mg Per Tube Daily   mouth rinse  15 mL Mouth Rinse 10 times per day   melatonin  5 mg Per Tube QHS   metoprolol tartrate  25 mg Per Tube Daily   multivitamin  15 mL Per Tube Daily   oseltamivir  30 mg Per Tube BID   pantoprazole sodium  40 mg Per Tube Daily   pravastatin  20 mg Per Tube q1800   Continuous Infusions:   PRN Meds:.acetaminophen, acetaminophen, docusate, ipratropium-albuterol, metoprolol tartrate   ASSESSMENT & PLAN  Acute on Chronic Hypoxic Respiratory Failure secondary to suspected aspiration in the setting of 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 for vocal cord paralysis PMHx: Tracheal  stenosis -continue ventilator support & lung protective strategies -Wean PEEP & FiO2 as tolerated, maintain SpO2 > 90% -Head of bed elevated 30 degrees -Intermittent chest x-ray & ABG PRN -Ensure adequate pulmonary hygiene  -bronchodilators PRN -Lopressor  PRN from 2.5 to 5 mg Q 6 PRN -Xanax TID added back as PRN for anxiety  Sepsis due to suspected Abdominal Abscess & recurrent Aspiration Pneumonia Met sepsis criteria on admission Follow up Chest CT with right pleural effusion and bilateral lower lobe consolidation concerning for aspiration pneumonia -F/u cultures, trend lactic/ PCT -Monitor WBC/ fever curve -IV antibiotics: cefepime & vancomycin&Flagyl -IVF hydration as needed -Pressors for MAP goal >65 -Strict I/O's    -stenotrophomonas + on resp cx    Influenza A infection   tamiflu  Possible subcutaneous periumbilical abscess -CT Abdomen/Pelvis concerning for possible abscess  -Per surgery restart continuous TF at low rate,if aspiration continues will obtain GI series with oral contrast to the gastrostomy can be helpful.  If gastroparesis is not the cause of her reflux and aspiration another option is to consider exchanging the gastrostomy tube to a gastrojejunostomy tube under fluoroscopic guidance. -Consider Reglan for GI motility -General surgery consult appreciated    Diabetes mellitus      -CBG's -SSI and Lantus -Follow ICU Hypo/hyperglycemia protocol    Nutritional Status/Diet -Will start trickle TF per PEG. General Surgery ok to use -  Dietician following, TF's as tolerated -Constipation protocol as indicated    Electrolytes -follow labs as needed -replace as needed -pharmacy consultation and following    Best practice (right click and "Reselect all SmartList Selections" daily)  Diet:  Tube Feed  Pain/Anxiety/Delirium protocol (if indicated): Yes (RASS goal 0) VAP protocol (if indicated): Yes DVT prophylaxis: Subcutaneous Heparin GI prophylaxis:  PPI Glucose control:  SSI Yes Central venous access:  N/A Arterial line:  N/A Foley:  Yes, and it is still needed Mobility:  bed rest  PT consulted: N/A Last date of multidisciplinary goals of care discussion [02/26/21] Code Status:  full code Disposition: ICU  Critical care provider statement:   Total critical care time: 33 minutes   Performed by: Lanney Gins MD   Critical care time was exclusive of separately billable procedures and treating other patients.   Critical care was necessary to treat or prevent imminent or life-threatening deterioration.   Critical care was time spent personally by me on the following activities: development of treatment plan with patient and/or surrogate as well as nursing, discussions with consultants, evaluation of patient's response to treatment, examination of patient, obtaining history from patient or surrogate, ordering and performing treatments and interventions, ordering and review of laboratory studies, ordering and review of radiographic studies, pulse oximetry and re-evaluation of patient's condition.    Ottie Glazier, M.D.  Pulmonary & Critical Care Medicine

## 2021-03-05 LAB — THYROID PANEL WITH TSH
Free Thyroxine Index: 2.3 (ref 1.2–4.9)
T3 Uptake Ratio: 29 % (ref 24–39)
T4, Total: 7.8 ug/dL (ref 4.5–12.0)
TSH: 2.09 u[IU]/mL (ref 0.450–4.500)

## 2021-03-05 LAB — BASIC METABOLIC PANEL
Anion gap: 10 (ref 5–15)
BUN: 36 mg/dL — ABNORMAL HIGH (ref 8–23)
CO2: 36 mmol/L — ABNORMAL HIGH (ref 22–32)
Calcium: 10 mg/dL (ref 8.9–10.3)
Chloride: 97 mmol/L — ABNORMAL LOW (ref 98–111)
Creatinine, Ser: 0.76 mg/dL (ref 0.44–1.00)
GFR, Estimated: >60 mL/min (ref >60)
Glucose, Bld: 177 mg/dL — ABNORMAL HIGH (ref 70–99)
Potassium: 4.3 mmol/L (ref 3.5–5.1)
Sodium: 143 mmol/L (ref 135–145)

## 2021-03-05 LAB — BLOOD GAS, VENOUS
Acid-Base Excess: 16.1 mmol/L — ABNORMAL HIGH (ref 0.0–2.0)
Bicarbonate: 45.4 mmol/L — ABNORMAL HIGH (ref 20.0–28.0)
FIO2: 0.28
MECHVT: 400 mL
Mechanical Rate: 12
O2 Saturation: 74.4 %
PEEP: 5 cmH2O
Patient temperature: 37
pCO2, Ven: 75 mmHg (ref 44.0–60.0)
pH, Ven: 7.39 (ref 7.250–7.430)
pO2, Ven: 40 mmHg (ref 32.0–45.0)

## 2021-03-05 LAB — GLUCOSE, CAPILLARY
Glucose-Capillary: 157 mg/dL — ABNORMAL HIGH (ref 70–99)
Glucose-Capillary: 172 mg/dL — ABNORMAL HIGH (ref 70–99)
Glucose-Capillary: 206 mg/dL — ABNORMAL HIGH (ref 70–99)
Glucose-Capillary: 210 mg/dL — ABNORMAL HIGH (ref 70–99)
Glucose-Capillary: 120 mg/dL — ABNORMAL HIGH (ref 70–99)
Glucose-Capillary: 156 mg/dL — ABNORMAL HIGH (ref 70–99)

## 2021-03-05 LAB — CBC
HCT: 39.4 % (ref 36.0–46.0)
Hemoglobin: 12.1 g/dL (ref 12.0–15.0)
MCH: 27.4 pg (ref 26.0–34.0)
MCHC: 30.7 g/dL (ref 30.0–36.0)
MCV: 89.1 fL (ref 80.0–100.0)
Platelets: 290 10*3/uL (ref 150–400)
RBC: 4.42 MIL/uL (ref 3.87–5.11)
RDW: 13.8 % (ref 11.5–15.5)
WBC: 13 10*3/uL — ABNORMAL HIGH (ref 4.0–10.5)
nRBC: 0 % (ref 0.0–0.2)

## 2021-03-05 LAB — CULTURE, RESPIRATORY W GRAM STAIN

## 2021-03-05 LAB — PHOSPHORUS: Phosphorus: 4.1 mg/dL (ref 2.5–4.6)

## 2021-03-05 LAB — MAGNESIUM: Magnesium: 2.3 mg/dL (ref 1.7–2.4)

## 2021-03-05 MED ORDER — LACTATED RINGERS IV SOLN: INTRAVENOUS | Status: DC

## 2021-03-05 MED ORDER — LACTATED RINGERS IV BOLUS
500.0000 mL | Freq: Once | INTRAVENOUS | Status: AC
  Administered 2021-03-05: 500 mL via INTRAVENOUS

## 2021-03-05 MED ORDER — LEVOFLOXACIN IN D5W 750 MG/150ML IV SOLN
750.0000 mg | INTRAVENOUS | Status: DC
  Administered 2021-03-05 – 2021-03-06 (×2): 750 mg via INTRAVENOUS
  Filled 2021-03-05 (×3): qty 150

## 2021-03-05 NOTE — Progress Notes (Signed)
NAME:  Angela David, MRN:  [id number], DOB:  18-Oct-1940, LOS: 5 ADMISSION DATE:  02/20/2021, CONSULTATION DATE:  02/20/2021 REFERRING MD: Imagene Sheller, MD. 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   02/27/21- patient remains on ventilator. S/p sugery evaluation of PEG tube. Resp cultures are stenotrophomonas.  She has pneumonia with parapneumonic effusion concerning for possible empyema. [name] at bedside reveals no pleural effusion bilaterally 02/29/20- patient weaned down on FIO2 today , will attemp SBT in am for liberation from MV. Diuresed well today >3L with improved resp status 03/01/21- patient exhibiting signs of severe derpression , she voiced not wanting to continue treatment medically, I have placed consult for palliative care and psychiatry. 03/02/21-  Patient still with what appears to be deprssion. I met with husband today and patient was evaluated by psychiatry today.  03/03/21-  patient seems to have component of pneumonia induced tachycardia and possible septic encephalopathy.  ID consult due to unclear infection status 03/04/21- patient continues to be somnolent and is with bacterial and viral pneumonia with septic enceophalpathy. 03/05/21-patient is essentially unimproved overnight.  She has been oliguric today and received IVF.   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 PCCM consulted for vent management 12/26: General surgery consulted 12/27: Remains on full vent support. 12/28: Remains agitated on the vent. CT head and chest obtained 12/30: Patient with episode of respiratory distress due to suspected aspiration. Bolus feed held.STAT CXR performed which revealed a worsening R pleural effusion. 12/31: Remains on the vent.  Cultures:  01/01 BCx: pending 01/01 UCx: pending 12/28 BCx: NGTD 12/26 BCx: NG final 12/30 RCx: NG final 12/26 MRSA PCR: not detected  12/26 respiratory panel: negative 12/26 SARS CoV-2: negative 12/26 influenza A/B: negative 12/26: Strep pneumo urinary antigen>>negative 12/26: Legionella urinary antigen>>negative   Antimicrobials:  12/26 vancomycin >> 12/29    01/01 >> 12/30 metronidazole >> 12/26 cefepime >> Interim History / Subjective:  -No acute events overnight. -Remains febrile, cultures/test obtained and pending  OBJECTIVE   Blood pressure (!) 131/58, pulse (!) 107, temperature 99 F (37.2 C), temperature source Oral, resp. rate 18, height 5' 4.02" (1.626 m), weight 72.4 kg, SpO2 95 %.    Vent Mode:  PRVC FiO2 (%):  [28 %] 28 % Set Rate:  [12 bmp] 12 bmp Vt Set:  [400 mL] 400 mL PEEP:  [5 cmH20] 5 cmH20 Plateau  Pressure:  [18 cmH20] 18 cmH20   Intake/Output Summary (Last 24 hours) at 03/05/2021 1109 Last data filed at 03/05/2021 0700 Gross per 24 hour  Intake 150 ml  Output 301 ml  Net -151 ml    Filed Weights   03/03/21 0500 03/04/21 0448 03/05/21 0432  Weight: 71.4 kg 71.8 kg 72.4 kg   Examination: GENERAL:age-year-old patient lying in the bed on the vent via trach EYES: Pupils equal, round, reactive to light and accommodation. No scleral icterus. Extraocular muscles intact.  HEENT: Head atraumatic, normocephalic. Oropharynx and nasopharynx clear.  NECK:  Supple, no jugular venous distention. No thyroid enlargement, no tenderness.  LUNGS: mild rhonchi CARDIOVASCULAR: S1, S2 normal. No murmurs, rubs, or gallops.  ABDOMEN: Soft, nontender, nondistended. Bowel sounds present. No organomegaly or mass. PEG tube in place EXTREMITIES: No pedal edema, cyanosis, or clubbing.  NEUROLOGIC: Cranial nerves II through XII are intact. Muscle strength 5/5 in all extremities. Sensation intact. Gait not checked.  PSYCHIATRIC: somnolent  SKIN: No obvious rash, lesion, or ulcer.   Labs/imaging that I havepersonally reviewed  (right click and "Reselect all SmartList Selections" daily)    Labs   CBC: Recent Labs  Lab 03/01/21 0508 03/02/21 0548 03/03/21 0947 03/04/21 0523 03/05/21 0356  WBC 10.8* 16.0* 15.6* 15.2* 13.0*  HGB 13.0 12.9 11.9* 12.1 12.1  HCT 41.0 41.4 39.5 39.2 39.4  MCV 85.4 88.5 89.0 88.3 89.1  PLT 207 280 287 295 290     Basic Metabolic Panel: Recent Labs  Lab 02/26/21 2016 02/26/21 2225 02/27/21 0456 02/28/21 0524 03/01/21 0508 03/02/21 0548 03/04/21 0523 03/05/21 0356  NA  --    < > 146* 144 139 142 142 143  K  --    < > 3.6 4.1 3.8 4.2 4.2 4.3  CL  --    < > 106 107 98 97* 96* 97*  CO2  --    < > _0 35* 36* 36*  GLUCOSE  --    < > 193* 157* 218* 212* 294* 177*  BUN  --    < > 32* 21 24* 30* 36* 36*  CREATININE  --    < > 0.93 0.93 0.88 0.93 0.80 0.76   CALCIUM  --    < > 8.4* 8.8* 9.0 9.2 9.9 10.0  MG  --   --  1.9 2.2  --   --  2.1 2.3  PHOS 2.3*  --  2.4* 2.2*  --   --  2.4* 4.1   < > = values in this interval not displayed.    GFR: Estimated Creatinine Clearance: 56.5 mL/min (by C-G formula based on SCr of 0.76 mg/dL). Recent Labs  Lab 03/02/21 0548 03/03/21 0947 03/04/21 0523 03/05/21 0356  PROCALCITON  --  0.17  --   --   WBC 16.0* 15.6* 15.2* 13.0*     Liver Function Tests: Recent Labs  Lab 02/27/21 0456  AST 27  ALT 20  ALKPHOS 68  BILITOT 0.8  PROT 6.4*  ALBUMIN 2.7*    No results for input(s): LIPASE, AMYLASE in the last 168 hours. No results for input(s): AMMONIA in the last 168 hours.  ABG    Component Value Date/Time   PHART >7.750 (HH) 03/04/2021 1039   PCO2ART 26 (L) 03/04/2021 1039   PO2ART  174 (H) 03/04/2021 1039   HCO3 45.4 (H) 03/05/2021 0427   ACIDBASEDEF 0.9 01/19/2020 1024   O2SAT 74.4 03/05/2021 0427      Coagulation Profile: No results for input(s): INR, PROTIME in the last 168 hours.   Cardiac Enzymes: No results for input(s): CKTOTAL, CKMB, CKMBINDEX, TROPONINI in the last 168 hours.  HbA1C: Hgb A1c MFr Bld  Date/Time Value Ref Range Status  01/20/2020 07:19 AM 6.3 (H) 4.8 - 5.6 % Final    Comment:    (NOTE) Pre diabetes:          5.7%-6.4%  Diabetes:              >6.4%  Glycemic control for   <7.0% adults with diabetes   03/21/2019 03:24 AM 6.6 (H) 4.8 - 5.6 % Final    Comment:    (NOTE) Pre diabetes:          5.7%-6.4% Diabetes:              >6.4% Glycemic control for   <7.0% adults with diabetes     CBG: Recent Labs  Lab 03/04/21 1622 03/04/21 1924 03/04/21 2309 03/05/21 0337 03/05/21 0733  GLUCAP 140* 155* 156* 157* 172*     Allergies Allergies  Allergen Reactions   Shellfish Allergy Shortness Of Breath    respitatory distress    Sodium Sulfite Shortness Of Breath    Severe Congestion that creates inability to breath   Sulfa Antibiotics  Shortness Of Breath and Other (See Comments)    respitatory distress   Codeine Other (See Comments)    Per patient "it kept her up all night"   Glucerna [Alimentum] Itching   Wound Dressing Adhesive Rash      Home Medications  Prior to Admission medications   Medication Sig Start Date End Date Taking? Authorizing Provider  acetaminophen (TYLENOL) 500 MG tablet Take 1,000 mg by mouth in the morning and at bedtime.   Yes [provider]  busPIRone (BUSPAR) 15 MG tablet Place 15 mg into feeding tube 2 (two) times daily. 09/26/20  Yes [provider]  carboxymethylcellulose (REFRESH PLUS) 0.5 % SOLN Place 1 drop into both eyes 3 (three) times daily as needed (dry eyes).   Yes [provider]  denosumab (PROLIA) 60 MG/ML SOSY injection Inject 60 mg into the skin every 6 (six) months.   Yes [provider]  EPINEPHrine 0.3 mg/0.3 mL IJ SOAJ injection epinephrine 0.3 mg/0.3 mL injection, auto-injector  Inject 0.3 mLs (0.3 mg dose) into the muscle once as needed for Anaphylaxis for up to 1 dose. 09/10/18  Yes [provider]  escitalopram (LEXAPRO) 10 MG tablet Place 3 tablets (30 mg total) into feeding tube daily. Patient taking differently: Place 10 mg into feeding tube daily. 02/13/20  Yes Kasa, Maretta Bees, MD  escitalopram (LEXAPRO) 20 MG tablet Place 20 mg into feeding tube daily.   Yes [provider]  folic acid (FOLVITE) 1 MG tablet Place 1 tablet (1 mg total) into feeding tube daily. 02/13/20  Yes Flora Lipps, MD  gabapentin (NEURONTIN) 600 MG tablet Place 600 mg into feeding tube 3 (three) times daily.   Yes [provider]  lovastatin (MEVACOR) 20 MG tablet Place 20 mg into feeding tube daily. 06/13/20  Yes [provider]  meclizine (ANTIVERT) 25 MG tablet Take 25 mg by mouth daily. 02/17/21  Yes [provider]  melatonin 5 MG TABS Place 1 tablet (5 mg total) into feeding tube at bedtime.  02/13/20  Yes Flora Lipps, MD  metFORMIN (GLUCOPHAGE) 1000 MG tablet Place 1,000 mg into feeding tube daily with breakfast. 06/10/20  Yes [provider]  metoprolol tartrate (LOPRESSOR) 25 MG tablet Place 25 mg into feeding tube daily. 06/13/20  Yes [provider]  Multiple Vitamins-Minerals (HAIR/SKIN/NAILS) TABS Take 1 tablet by mouth daily.   Yes [provider]  [facility] powder Apply topically. 01/18/21  Yes [provider]  pantoprazole (PROTONIX) 40 MG tablet Take 40 mg by mouth daily.   Yes [provider]  rOPINIRole (REQUIP) 0.5 MG tablet Place 1 tablet (0.5 mg total) into feeding tube at bedtime. 02/13/20  Yes Kasa, Maretta Bees, MD  sodium chloride 0.9 % nebulizer solution sodium chloride 0.9 % for nebulization  USE 5 ML BY NEBULIZTION AS NEEDED FOR WHEEZING 07/17/19  Yes [provider]  Scheduled Meds:  busPIRone  15 mg Per Tube BID   chlorhexidine gluconate (MEDLINE KIT)  15 mL Mouth Rinse BID   Chlorhexidine Gluconate Cloth  6 each Topical Q0600   enoxaparin (LOVENOX) injection  40 mg Subcutaneous QHS   escitalopram  30 mg Per Tube Daily   feeding supplement ([employer] [place] STANDARD 1.4)  1,440 mL Per Tube Daily   folic acid  1 mg Per Tube Daily   free water  50 mL Per Tube Q4H   insulin aspart  0-20 Units Subcutaneous Q4H   insulin detemir  10 Units Subcutaneous BID   ipratropium-albuterol  3 mL Nebulization TID   loratadine  10 mg Per Tube Daily   mouth rinse  15 mL Mouth Rinse 10 times per day   melatonin  5 mg Per Tube QHS   metoprolol tartrate  25 mg Per Tube Daily   multivitamin  15 mL Per Tube Daily   oseltamivir  30 mg Per Tube BID   pantoprazole sodium  40 mg Per Tube Daily   pravastatin  20 mg Per Tube q1800   Continuous Infusions:   PRN Meds:.acetaminophen, acetaminophen, docusate, ipratropium-albuterol, metoprolol tartrate   ASSESSMENT & PLAN  Acute on Chronic Hypoxic Respiratory Failure secondary to suspected aspiration in the  setting of 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 -continue ventilator support & lung protective strategies -Wean PEEP & FiO2 as tolerated, maintain SpO2 > 90% -Head of bed elevated 30 degrees -Intermittent chest x-ray & ABG PRN -Ensure adequate pulmonary hygiene  -bronchodilators PRN -Lopressor  PRN from 2.5 to 5 mg Q 6 PRN -Xanax TID added back as PRN for anxiety  Sepsis due to suspected Abdominal Abscess & recurrent Aspiration Pneumonia Met sepsis criteria on admission Follow up Chest CT with right pleural effusion and bilateral lower lobe consolidation concerning for aspiration pneumonia -F/u cultures, trend lactic/ PCT -Monitor WBC/ fever curve -IV antibiotics: cefepime & vancomycin&Flagyl -IVF hydration as needed -Pressors for MAP goal >65 -Strict I/O's    -stenotrophomonas + on resp cx    Influenza A infection   tamiflu  Possible subcutaneous periumbilical abscess -CT Abdomen/Pelvis concerning for possible abscess  -Per surgery restart continuous TF at low rate,if aspiration continues will obtain GI series with oral contrast to the gastrostomy can be helpful.  If gastroparesis is not the cause of her reflux and aspiration another option is to consider exchanging the gastrostomy tube to a gastrojejunostomy tube under fluoroscopic guidance. -Consider Reglan for GI motility -General surgery consult appreciated    Diabetes mellitus      -CBG's -SSI and Lantus -Follow ICU Hypo/hyperglycemia protocol  Nutritional Status/Diet -Will start trickle TF per PEG. General Surgery ok to use -Dietician following, TF's as tolerated -Constipation protocol as indicated    Electrolytes -follow labs as needed -replace as needed -pharmacy consultation and following    Best practice (right click and "Reselect all SmartList Selections" daily)  Diet:  Tube Feed  Pain/Anxiety/Delirium protocol (if indicated): Yes (RASS goal 0) VAP protocol (if  indicated): Yes DVT prophylaxis: Subcutaneous Heparin GI prophylaxis: PPI Glucose control:  SSI Yes Central venous access:  N/A Arterial line:  N/A Foley:  Yes, and it is still needed Mobility:  bed rest  PT consulted: N/A Last date of multidisciplinary goals of care discussion [02/26/21] Code Status:  full code Disposition: ICU  Critical care provider statement:   Total critical care time: 33 minutes   Performed by: Lanney Gins MD   Critical care time was exclusive of separately billable procedures and treating other patients.   Critical care was necessary to treat or prevent imminent or life-threatening deterioration.   Critical care was time spent personally by me on the following activities: development of treatment plan with patient and/or surrogate as well as nursing, discussions with consultants, evaluation of patient's response to treatment, examination of patient, obtaining history from patient or surrogate, ordering and performing treatments and interventions, ordering and review of laboratory studies, ordering and review of radiographic studies, pulse oximetry and re-evaluation of patient's condition.    Ottie Glazier, M.D.  Pulmonary & Critical Care Medicine

## 2021-03-06 ENCOUNTER — Ambulatory Visit (HOSPITAL_COMMUNITY)
Admission: AD | Admit: 2021-03-06 | Discharge: 2021-03-06 | Disposition: A | Discharge status: Home / Self Care | Payer: Medicare Other | Source: Other Acute Inpatient Hospital | Attending: Internal Medicine | Admitting: Internal Medicine

## 2021-03-06 DIAGNOSIS — J9690 Respiratory failure, unspecified, unspecified whether with hypoxia or hypercapnia: Secondary | ICD-10-CM | POA: Insufficient documentation

## 2021-03-06 DIAGNOSIS — R6520 Severe sepsis without septic shock: Secondary | ICD-10-CM | POA: Diagnosis not present

## 2021-03-06 DIAGNOSIS — A419 Sepsis, unspecified organism: Secondary | ICD-10-CM | POA: Diagnosis not present

## 2021-03-06 DIAGNOSIS — J9601 Acute respiratory failure with hypoxia: Secondary | ICD-10-CM | POA: Diagnosis not present

## 2021-03-06 DIAGNOSIS — R0603 Acute respiratory distress: Secondary | ICD-10-CM | POA: Diagnosis not present

## 2021-03-06 LAB — BASIC METABOLIC PANEL
Anion gap: 9 (ref 5–15)
BUN: 28 mg/dL — ABNORMAL HIGH (ref 8–23)
CO2: 33 mmol/L — ABNORMAL HIGH (ref 22–32)
Calcium: 9.4 mg/dL (ref 8.9–10.3)
Chloride: 100 mmol/L (ref 98–111)
Creatinine, Ser: 0.67 mg/dL (ref 0.44–1.00)
GFR, Estimated: >60 mL/min (ref >60)
Glucose, Bld: 178 mg/dL — ABNORMAL HIGH (ref 70–99)
Potassium: 4.6 mmol/L (ref 3.5–5.1)
Sodium: 142 mmol/L (ref 135–145)

## 2021-03-06 LAB — CBC
HCT: 34.9 % — ABNORMAL LOW (ref 36.0–46.0)
Hemoglobin: 10.6 g/dL — ABNORMAL LOW (ref 12.0–15.0)
MCH: 27.6 pg (ref 26.0–34.0)
MCHC: 30.4 g/dL (ref 30.0–36.0)
MCV: 90.9 fL (ref 80.0–100.0)
Platelets: 326 10*3/uL (ref 150–400)
RBC: 3.84 MIL/uL — ABNORMAL LOW (ref 3.87–5.11)
RDW: 14.1 % (ref 11.5–15.5)
WBC: 11.3 10*3/uL — ABNORMAL HIGH (ref 4.0–10.5)
nRBC: 0 % (ref 0.0–0.2)

## 2021-03-06 LAB — MAGNESIUM: Magnesium: 2.3 mg/dL (ref 1.7–2.4)

## 2021-03-06 LAB — GLUCOSE, CAPILLARY
Glucose-Capillary: 174 mg/dL — ABNORMAL HIGH (ref 70–99)
Glucose-Capillary: 176 mg/dL — ABNORMAL HIGH (ref 70–99)
Glucose-Capillary: 166 mg/dL — ABNORMAL HIGH (ref 70–99)
Glucose-Capillary: 199 mg/dL — ABNORMAL HIGH (ref 70–99)

## 2021-03-06 LAB — PHOSPHORUS: Phosphorus: 2.9 mg/dL (ref 2.5–4.6)

## 2021-03-06 MED ORDER — CHLORHEXIDINE GLUCONATE 0.12% ORAL RINSE (MEDLINE KIT): 15.0000 mL | Freq: Two times a day (BID) | OROMUCOSAL | 0 refills | Status: DC

## 2021-03-06 MED ORDER — ENOXAPARIN SODIUM 40 MG/0.4ML IJ SOSY: 40.0000 mg | PREFILLED_SYRINGE | Freq: Every day | INTRAMUSCULAR | Status: DC

## 2021-03-06 MED ORDER — ACETAMINOPHEN 325 MG PO TABS: 650.0000 mg | ORAL_TABLET | Freq: Four times a day (QID) | ORAL | Status: DC | PRN

## 2021-03-06 MED ORDER — LORATADINE 10 MG PO TABS: 10.0000 mg | ORAL_TABLET | Freq: Every day | ORAL | Status: DC

## 2021-03-06 MED ORDER — LACTATED RINGERS IV SOLN: 75.0000 mL | INTRAVENOUS | 0 refills | Status: DC

## 2021-03-06 MED ORDER — METOPROLOL TARTRATE 5 MG/5ML IV SOLN: 5.0000 mg | Freq: Four times a day (QID) | INTRAVENOUS | Status: DC | PRN

## 2021-03-06 MED ORDER — ORAL CARE MOUTH RINSE: 15.0000 mL | OROMUCOSAL | 0 refills | Status: DC

## 2021-03-06 MED ORDER — PANTOPRAZOLE SODIUM 40 MG PO PACK: 40.0000 mg | PACK | Freq: Every day | ORAL | Status: DC

## 2021-03-06 MED ORDER — IPRATROPIUM-ALBUTEROL 0.5-2.5 (3) MG/3ML IN SOLN: 3.0000 mL | RESPIRATORY_TRACT | Status: DC | PRN

## 2021-03-06 MED ORDER — CHLORHEXIDINE GLUCONATE CLOTH 2 % EX PADS: 6.0000 | MEDICATED_PAD | Freq: Every day | CUTANEOUS | Status: DC

## 2021-03-06 MED ORDER — KATE FARMS STANDARD 1.4 PO LIQD: 1440.0000 mL | Freq: Every day | ORAL | Status: DC

## 2021-03-06 MED ORDER — DIPHENHYDRAMINE HCL 25 MG PO CAPS
25.0000 mg | ORAL_CAPSULE | Freq: Four times a day (QID) | ORAL | Status: DC | PRN
  Administered 2021-03-06: 25 mg via ORAL
  Filled 2021-03-06 (×3): qty 1

## 2021-03-06 MED ORDER — OSELTAMIVIR PHOSPHATE 30 MG PO CAPS: 30.0000 mg | ORAL_CAPSULE | Freq: Two times a day (BID) | ORAL | Status: DC

## 2021-03-06 MED ORDER — ADULT MULTIVITAMIN LIQUID CH: 15.0000 mL | Freq: Every day | ORAL | Status: AC

## 2021-03-06 MED ORDER — ACETAMINOPHEN 650 MG RE SUPP: 650.0000 mg | RECTAL | 0 refills | Status: DC | PRN

## 2021-03-06 MED ORDER — PRAVASTATIN SODIUM 20 MG PO TABS: 20.0000 mg | ORAL_TABLET | Freq: Every day | ORAL | Status: DC

## 2021-03-06 MED ORDER — METOPROLOL TARTRATE 25 MG PO TABS: 25.0000 mg | ORAL_TABLET | Freq: Every day | ORAL | Status: DC

## 2021-03-06 MED ORDER — INSULIN ASPART 100 UNIT/ML IJ SOLN: 0.0000 [IU] | INTRAMUSCULAR | 11 refills | Status: DC

## 2021-03-06 MED ORDER — DIPHENHYDRAMINE HCL 25 MG PO CAPS: 25.0000 mg | ORAL_CAPSULE | Freq: Four times a day (QID) | ORAL | 0 refills | Status: DC | PRN

## 2021-03-06 MED ORDER — LEVOFLOXACIN IN D5W 750 MG/150ML IV SOLN: 750.0000 mg | INTRAVENOUS | Status: DC

## 2021-03-06 MED ORDER — IPRATROPIUM-ALBUTEROL 0.5-2.5 (3) MG/3ML IN SOLN: 3.0000 mL | Freq: Three times a day (TID) | RESPIRATORY_TRACT | Status: DC

## 2021-03-06 MED ORDER — INSULIN DETEMIR 100 UNIT/ML ~~LOC~~ SOLN: 10.0000 [IU] | Freq: Two times a day (BID) | SUBCUTANEOUS | 11 refills | Status: DC

## 2021-03-06 MED ORDER — KATE FARMS STANDARD 1.4 PO LIQD
1440.0000 mL | Freq: Every day | ORAL | Status: DC
  Filled 2021-03-06: qty 1625

## 2021-03-06 MED ORDER — DOCUSATE SODIUM 50 MG/5ML PO LIQD: 100.0000 mg | Freq: Every day | ORAL | 0 refills | Status: DC | PRN

## 2021-03-06 MED ORDER — ESCITALOPRAM OXALATE 10 MG PO TABS: 30.0000 mg | ORAL_TABLET | Freq: Every day | ORAL | Status: DC

## 2021-03-06 MED ORDER — FREE WATER: 50.0000 mL | Status: DC

## 2021-03-06 NOTE — Discharge Summary (Signed)
Physician Discharge Summary  Patient ID: Angela David MRN: [id number] DOB/AGE: 1940/05/10 81 y.o.  Admit date: 02/20/2021 Discharge date: 03/06/2021   Brief Pt Description / Synopsis:  81 y.o. female admitted with acute on chronic hypoxic respiratory failure requiring chronic tracheostomy due to vocal cord paralysis admitted with suspected aspiration pneumonia/healthcare associated pneumonia and influenza A viral infection requiring mechanical ventilation.   Discharge Diagnoses:   Acute on Chronic Hypoxic Respiratory Failure Suspected Healthcare Associated Pneumonia vs. Colonization Aspiration Pneumonia Influenza A Viral Infection Sepsis Diabetes Mellitus                                                            Discharge Summary:  81 y.o with significant PMH of remote breast cancer in 2002 s/p chemo and radiation, DM2, ventral hernia s/p robotic incisional hernia repair and right upper outer quadrant mass s/p RFA guided right lumpectomy (09/2020), peripheral neuropathy, GERD, vocal cord paralysis s/p chronic trach and PEG who presented to the ED with chief complaints of fevers, chills and SOB.   Patient reports having some pain, irritation and drainage around the PEG site since Friday. Later she developed fever, chills, generalized  malaise and SOB. Patient was started on Augmentin for suspected cellulitis of  around PEG site with no improvement.   ED Course: On arrival to the ED, she was febrile 101.5 (38.6) with blood pressure 126/65 mm Hg and pulse rate 111 beats/min, RR 27 with sats 87% on RA. There were no focal neurological deficits;  Patient mildly tachycardic mildly tachypneic hypoxic with mild abdominal pain. Given recent treatment for cellulitis, CT imaging was ordered.  Pertinent Labs in Red/Diagnostics Findings: Na+/ K+: 134/3.9 Glucose: 217 BUN/Cr.: 18/0.94 AST/ALT:59/41   WBC/ TMAX: 10.0/ afebrile PCT: pending Lactic acid: 2.5 COVID PCR: Negative   Patient  had a CT scan of the abdomen pelvis today which shows a potential small focal subcutaneous fluid collection to the left of the umbilicus measuring 2.4 x 1.5 x 1.2 cm concerning for possible abscess. Surgery consulted for further evaluation.  Patient also with O2 requirement and possibly developing pneumonia but no infiltrates on chest x-ray. Patient was started on broad spectrum antibiotics and admitted to hospitalist service. PCCM consulted to assist with vent management.   Please see "Significant Events" section below for full detailed hospital course.  Significant Hospital Events   12/26: Admitted to stepdown with sespsis secondary to suspected abdominal abscess and possible early pneumonia 12/26: General surgery consulted 12/27: Remains on full vent support. 12/28: Remains agitated on the vent. CT head and chest obtained 12/30: Patient with episode of respiratory distress due to suspected aspiration. Bolus feed held.STAT CXR performed which revealed a worsening R pleural effusion. 12/31: Remains on the vent. 02/27/21- patient remains on ventilator. S/p sugery evaluation of PEG tube. Resp cultures are stenotrophomonas.  She has pneumonia with parapneumonic effusion concerning for possible empyema. [name] at bedside reveals no pleural effusion bilaterally 02/29/20- patient weaned down on FIO2 today , will attemp SBT in am for liberation from MV. Diuresed well today >3L with improved resp status 03/01/21- patient exhibiting signs of severe derpression , she voiced not wanting to continue treatment medically, I have placed consult for palliative care and psychiatry. 03/02/21-  Patient still with what appears to be deprssion. I met with husband  today and patient was evaluated by psychiatry today.  03/03/21-  patient seems to have component of pneumonia induced tachycardia and possible septic encephalopathy.  ID consult due to unclear infection status 03/04/21- patient continues to be somnolent and is with bacterial  and viral pneumonia with septic enceophalpathy. 03/05/21-patient is essentially unimproved overnight.  She has been oliguric today and received IVF.  03/06/21: Tolerating TC trial   Discharge Plan by Diagnosis:   Acute on Chronic Hypoxic & Respiratory Failure Secondary to suspected aspiration pneumonia, ? healthcare associated pneumonia (? COLONIZATION) and influenza A viral infection Hx: Tracheal stenosis requiring chronic tracheostomy -Full vent support, implement lung protective strategies -Plateau pressures less than 30 cm H20 -Wean FiO2 & PEEP as tolerated to maintain O2 sats >92% -Follow intermittent Chest X-ray & ABG as needed -Spontaneous Breathing Trials / Trach collar trials as tolerated -Implement VAP Bundle -Prn Bronchodilators -See antibiotic course as above -Continue Tamiflu   Sepsis due to Aspiration Pneumonia  Tracheal aspirate with  STENOTROPHOMONAS MALTOPHILIA & FLAVOBACTERIUM INDOLOGENES , PSEUDOMONAS AERUGINOSA (pan sensitive) & MODERATE SERRATIA MARCESCENS (resistant to Cefazolin) ~ ? COLONIZATION Suspected Abdominal Abscess   -Monitor fever curve -Trend WBC's & Procalcitonin -Follow cultures as above -Complete course Cefepime and Vancomycin -ID signed off -Continue empiric Levofloxacin and Tamiflu pending cultures & sensitivities   Possible subcutaneous periumbilical abscess CT Abdomen/Pelvis shows shows a potential small focal subcutaneous fluid collection to the left of the umbilicus measuring 2.4 x 1.5 x 1.2 cm concerning for possible abscess with G-tube is being pulled too tightly. -General surgery evaluated ~ No surgical issues ~  " CT scan pers reviewed and compared to prior CT earlier last week, there is some postoperative changes next to the hernia repair and small seroma but no abscess. Clinically no evidence of infection or abscess G tube in place.  Now able to tolerate TF at low rate" -Advance TF at slow rate as tolerated   Diabetes mellitus       -CBG's -SSI and Lantus -Follow ICU Hypo/hyperglycemia protocol    Nutritional Status/Diet -General Surgery ok to use PEG for feeds -Dietician following, TF's as tolerated -Constipation protocol as indicated                   Significant Diagnostic Studies:  12/26: Chest Xray>No active cardiopulmonary disease. 12/26: CTA abdomen and pelvis> shows a potential small focal subcutaneous fluid collection to the left of the umbilicus measuring 2.4 x 1.5 x 1.2 cm concerning for possible abscess 12/28: CT head>>IMPRESSION: 1. Stable age related cerebral atrophy, ventriculomegaly and advanced periventricular white matter disease. 2. No acute intracranial findings or mass lesions. 12/28: CT chest>>1. Moderate-sized right pleural effusion with overlying atelectasis. 2. Peribronchial thickening and bilateral lower lobe airspace consolidation likely pneumonia. Aspiration would certainly be a consideration given the appearance of the esophagus. 3. Stable markedly dilated esophagus suggesting achalasia. 4. Stable scattered bone lesions and healed rib fractures. 5. Aortic atherosclerosis. 1/1: CT abdomen pelvis with contrast>>IMPRESSION: Small bilateral pleural effusions are noted with adjacent subsegmental atelectasis or infiltrates, right greater than left. Stable size and appearance of rounded abnormality seen to the left of the umbilicus in the subcutaneous tissues in the anterior abdominal wall which may represent small abscess as noted on prior exam. Stable small fat containing supraumbilical ventral hernia. Hepatic steatosis. Aortic Atherosclerosis            Micro Data:  12/26: SARS-CoV-2 PCR> negative 12/26: Influenza PCR> negative 12/26: Blood culture x2>no growth 12/26: MRSA PCR>>  negative 12/26: Respiratory viral panel>>negative 12/26: Strep pneumo urinary antigen>negative 12/26: Legionella urinary antigen>negative 12/30: Tracheal aspirate>>STENOTROPHOMONAS MALTOPHILIA &  FLAVOBACTERIUM INDOLOGENES (sensitive to ciprofoxacin, zosyn, bactrim) 1/1: Blood culture x2>> no growth 1/1: Urine>>no growth 1/5: Tracheal aspirate>> MODERATE PSEUDOMONAS AERUGINOSA (pan sensitive) & MODERATE SERRATIA MARCESCENS (resistant to Cefazolin) 1/6: Influenza A PCR>> Positive   Antimicrobials:  Metronidazole 12/26 x1 dose; restarted 12/30>>1/2 Cefepime 12/26 >>1/2  Vancomycin 12/26>>1/2  Levofloxacin 1/5>> Oseltamivir 1/6>>  Consults:  PCCM General Surgery Infectious Diseases Palliative Care Psychiatry  Discharge Exam:  GENERAL: Acute on chronically ill-appearing female, laying in bed, on PSV on vent, no acute distress HEENT: Head atraumatic, normocephalic. Oropharynx and nasopharynx clear.  Neck:  Trachea midline, on trach collar.  Neck Supple, no jugular venous distention. No thyroid enlargement, no tenderness.  LUNGS: Normal breath sounds bilaterally, no wheezing, rales,rhonchi or crepitation. No use of accessory muscles of respiration.  CARDIOVASCULAR: S1, S2 normal. No murmurs, rubs, or gallops.  ABDOMEN: Soft, nontender, nondistended. Bowel sounds present. No organomegaly or mass.  PEG tube in place clean dry and intact EXTREMITIES: No pedal edema, cyanosis, or clubbing.  NEUROLOGIC: Sleeping, arouses easily to voice, follows commands on all 4 extremities, generalized weakness but no focal deficits, pupils PERRLA, nods to questions appropriately SKIN: No obvious rash, lesion, or ulcer.    Vitals:   03/06/21 1035 03/06/21 1100 03/06/21 1200 03/06/21 1338  BP:  (!) 112/50 (!) 109/47   Pulse: 77 79 84   Resp: '17 15 18   ' Temp:   97.9 F (36.6 C)   TempSrc:   Oral   SpO2: 98% 99% 98% 98%  Weight:      Height:         Discharge Labs:   BMET Recent Labs  Lab 02/28/21 0524 03/01/21 0508 03/02/21 0548 03/04/21 0523 03/05/21 0356 03/06/21 0600  NA 144 139 142 142 143 142  K 4.1 3.8 4.2 4.2 4.3 4.6  CL 107 98 97* 96* 97* 100  CO2 28 31 35* 36* 36* 33*   GLUCOSE 157* 218* 212* 294* 177* 178*  BUN 21 24* 30* 36* 36* 28*  CREATININE 0.93 0.88 0.93 0.80 0.76 0.67  CALCIUM 8.8* 9.0 9.2 9.9 10.0 9.4  MG 2.2  --   --  2.1 2.3 2.3  PHOS 2.2*  --   --  2.4* 4.1 2.9    CBC Recent Labs  Lab 03/04/21 0523 03/05/21 0356 03/06/21 0600  HGB 12.1 12.1 10.6*  HCT 39.2 39.4 34.9*  WBC 15.2* 13.0* 11.3*  PLT 295 290 326    Anti-Coagulation No results for input(s): INR in the last 168 hours.              Disposition: [facility]  Discharged Condition: Angela David has met maximum benefit of inpatient care and is medically stable for LTACH.      Time spent on disposition:  50 Minutes.     Signed: Darel Hong, AGACNP-BC [facility] Prefer epic messenger for cross cover needs If after hours, please call E-link

## 2021-03-06 NOTE — Progress Notes (Signed)
Physical Therapy Treatment Patient Details Name: Angela David MRN: [id number] DOB: [birth year] Today's Date: 03/06/2021   History of Present Illness 81 y.o with significant PMH of remote breast cancer in 2002 s/p chemo and radiation, DM2, ventral hernia s/p robotic incisional hernia repair and right upper outer quadrant mass s/p RFA guided right lumpectomy (09/2020), peripheral neuropathy, GERD, vocal cord paralysis s/p chronic trach and PEG who presented to the ED with chief complaints of fevers, chills and SOB.    PT Comments    Pt seen for PT tx with pt agreeable with encouragement/education. Pt reports fatigue & requires encouragement throughout. Pt more alert & engaged during session compared to last session where she barely responded to noxious stimuli. Pt reports she's surprised to hear she has the flu, stating "no one told me". Pt performs BLE strengthening exercises with multimodal cuing for technique but pt limits repetitions. Pt declines sitting EOB on this date. Will continue to follow & progress bed mobility, strength, & activity tolerance as able.  Pt on vent via trach collar with SPO2 99-100% during session.    Recommendations for follow up therapy are one component of a multi-disciplinary discharge planning process, led by the attending physician.  Recommendations may be updated based on patient status, additional functional criteria and insurance authorization.  Follow Up Recommendations  Skilled nursing-short term rehab (<3 hours/day)     Assistance Recommended at Discharge Frequent or constant Supervision/Assistance  Patient can return home with the following Two people to help with bathing/dressing/bathroom;Two people to help with walking and/or transfers;Direct supervision/assist for medications management;Help with stairs or ramp for entrance;Assistance with feeding;Assist for transportation;Direct supervision/assist for financial management;Assistance with  cooking/housework   Equipment Recommendations   (TBD in next venue)    Recommendations for Other Services       Precautions / Restrictions Precautions Precautions: Fall Restrictions Weight Bearing Restrictions: No     Mobility  Bed Mobility                    Transfers                        Ambulation/Gait                   Stairs             Wheelchair Mobility    Modified Rankin (Stroke Patients Only)       Balance                                            Cognition Arousal/Alertness: Lethargic Behavior During Therapy: Flat affect Overall Cognitive Status: Difficult to assess                                 General Comments: Pt opening eyes more & following commands more compared to last time this PT saw her but elects to keep eyes closed throughout majority of session. Requires encouragement/education for participation in session.        Exercises General Exercises - Lower Extremity Short Arc Quad: AAROM;Strengthening;Both;5 reps;Supine Heel Slides: AAROM;Strengthening;Both;5 reps;Supine Hip ABduction/ADduction: AAROM;Strengthening;5 reps;Both;Supine (pt performs 5x hip abduction slides with AAROM, encouraged pt to perform 5x hip adduction pillow squeezes with AROM)    General Comments  Pertinent Vitals/Pain Pain Assessment: Faces Faces Pain Scale: No hurt    Home Living                          Prior Function            PT Goals (current goals can now be found in the care plan section) Acute Rehab PT Goals Patient Stated Goal: pt doesn't state PT Goal Formulation: Patient unable to participate in goal setting Time For Goal Achievement: 03/10/21 Potential to Achieve Goals: Fair Progress towards PT goals: Progressing toward goals    Frequency    Min 2X/week      PT Plan Current plan remains appropriate    Co-evaluation              AM-PAC  PT "6 Clicks" Mobility   Outcome Measure  Help needed turning from your back to your side while in a flat bed without using bedrails?: A Lot Help needed moving from lying on your back to sitting on the side of a flat bed without using bedrails?: Total Help needed moving to and from a bed to a chair (including a wheelchair)?: Total Help needed standing up from a chair using your arms (e.g., wheelchair or bedside chair)?: Total Help needed to walk in hospital room?: Total Help needed climbing 3-5 steps with a railing? : Total 6 Click Score: 7    End of Session Equipment Utilized During Treatment: Oxygen (pt on vent via trach) Activity Tolerance: Patient limited by fatigue (pt self limiting) Patient left: in bed;with bed alarm set;with call bell/phone within reach   PT Visit Diagnosis: Unsteadiness on feet (R26.81);Muscle weakness (generalized) (M62.81);Difficulty in walking, not elsewhere classified (R26.2)     Time: 3646-8032 PT Time Calculation (min) (ACUTE ONLY): 9 min  Charges:  $Therapeutic Exercise: 8-22 mins                     Lavone Nian, PT, DPT 03/06/21, 11:42 AM    Waunita Schooner 03/06/2021, 11:41 AM

## 2021-03-06 NOTE — Progress Notes (Signed)
NAME:  Angela David, MRN:  [id number], DOB:  03-28-1940, LOS: 52 ADMISSION DATE:  02/20/2021, CONSULTATION DATE:  02/20/2021 REFERRING MD: Imagene Sheller, MD. REASON FOR CONSULT: Ventilator management   Brief Pt Description / Synopsis  81 y.o. female admitted with acute on chronic hypoxic respiratory failure requiring chronic tracheostomy due to vocal cord paralysis admitted with suspected aspiration pneumonia/healthcare associated pneumonia and influenza A viral infection requiring mechanical ventilation.  HPI  81 y.o with significant PMH of remote breast cancer in 2002 s/p chemo and radiation, DM2, ventral hernia s/p robotic incisional hernia repair and right upper outer quadrant mass s/p RFA guided right lumpectomy (09/2020), peripheral neuropathy, GERD, vocal cord paralysis s/p chronic trach and PEG who presented to the ED with chief complaints of fevers, chills and SOB.  Patient reports having some pain, irritation and drainage around the PEG site since Friday. Later she developed fever, chills, generalized  malaise and SOB. Patient was started on Augmentin for suspected cellulitis of  around PEG site with no improvement.  ED Course: On arrival to the ED, she was febrile 101.5 (38.6) with blood pressure 126/65 mm Hg and pulse rate 111 beats/min, RR 27 with sats 87% on RA. There were no focal neurological deficits;  Patient mildly tachycardic mildly tachypneic hypoxic with mild abdominal pain. Given recent treatment for cellulitis, CT imaging was ordered.  Pertinent Labs in Red/Diagnostics Findings: Na+/ K+: 134/3.9 Glucose: 217 BUN/Cr.: 18/0.94 AST/ALT:59/41   WBC/ TMAX: 10.0/ afebrile PCT: pending Lactic acid: 2.5 COVID PCR: Negative  Patient had a CT scan of the abdomen pelvis today which shows a potential small focal subcutaneous fluid collection to the left of the umbilicus measuring 2.4 x 1.5 x 1.2 cm concerning for possible abscess. Surgery consulted for further  evaluation.  Patient also with O2 requirement and possibly developing pneumonia but no infiltrates on chest x-ray. Patient was started on broad spectrum antibiotics and admitted to hospitalist service. PCCM consulted to assist with vent management.   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 12/28: Remains agitated on the vent. CT head and chest obtained 12/30: Patient with episode of respiratory distress due to suspected aspiration. Bolus feed held.STAT CXR performed which revealed a worsening R pleural effusion. 12/31: Remains on the vent. 02/27/21- patient remains on ventilator. S/p sugery evaluation of PEG tube. Resp cultures are stenotrophomonas.  She has pneumonia with parapneumonic effusion concerning for possible empyema. [name] at bedside reveals no pleural effusion bilaterally 02/29/20- patient weaned down on FIO2 today , will attemp SBT in am for liberation from MV. Diuresed well today >3L with improved resp status 03/01/21- patient exhibiting signs of severe derpression , she  voiced not wanting to continue treatment medically, I have placed consult for palliative care and psychiatry. 03/02/21-   Patient still with what appears to be deprssion. I met with husband today and patient was evaluated by psychiatry today.  03/03/21-  patient seems to have component of pneumonia induced tachycardia and possible septic encephalopathy.  ID consult due to unclear infection status 03/04/21- patient continues to be somnolent and is with bacterial and viral pneumonia with septic enceophalpathy. 03/05/21-patient is essentially unimproved overnight.  She has been oliguric today and received IVF.  03/06/21: Tolerating TC trial  Consults:  PCCM  Procedures:  None  Significant Diagnostic Tests:  12/26: Chest Xray>No active cardiopulmonary disease. 12/26: CTA abdomen and pelvis> shows a potential small focal subcutaneous fluid collection to the left of the umbilicus measuring 2.4 x 1.5 x 1.2 cm concerning for possible abscess 12/28: CT head>>IMPRESSION: 1. Stable age related cerebral atrophy, ventriculomegaly and advanced periventricular white matter disease. 2. No acute intracranial findings or mass lesions. 12/28: CT chest>>1. Moderate-sized right pleural effusion with overlying atelectasis. 2. Peribronchial thickening and bilateral lower lobe airspace consolidation likely pneumonia. Aspiration would certainly be a consideration given the appearance of the esophagus. 3. Stable markedly dilated esophagus suggesting achalasia. 4. Stable scattered bone lesions and healed rib fractures. 5. Aortic atherosclerosis. 1/1: CT abdomen pelvis with contrast>>IMPRESSION: Small bilateral pleural effusions are noted with adjacent subsegmental atelectasis or infiltrates, right greater than left. Stable size and appearance of rounded abnormality seen to the left of the umbilicus in the subcutaneous tissues in the anterior abdominal wall which may represent small abscess as noted on prior exam. Stable small fat containing supraumbilical ventral hernia. Hepatic steatosis. Aortic Atherosclerosis  Micro Data:  12/26:  SARS-CoV-2 PCR> negative 12/26: Influenza PCR> negative 12/26: Blood culture x2>no growth 12/26: MRSA PCR>> negative 12/26: Respiratory viral panel>>negative 12/26: Strep pneumo urinary antigen>negative 12/26: Legionella urinary antigen>negative 12/30: Tracheal aspirate>>STENOTROPHOMONAS MALTOPHILIA & FLAVOBACTERIUM INDOLOGENES (sensitive to ciprofoxacin, zosyn, bactrim) 1/1: Blood culture x2>> no growth 1/1: Urine>>no growth 1/5: Tracheal aspirate>> MODERATE PSEUDOMONAS AERUGINOSA (pan sensitive) & MODERATE SERRATIA MARCESCENS (resistant to Cefazolin) 1/6: Influenza A PCR>> Positive  Antimicrobials:  Metronidazole 12/26 x1 dose; restarted 12/30>>1/2 Cefepime 12/26 >>1/2  Vancomycin 12/26>>1/2  Levofloxacin 1/5>> Oseltamivir 1/6>>   OBJECTIVE  Blood pressure (!) 109/47, pulse 84, temperature 98 F (36.7 C), temperature source Oral, resp. rate 18, height 5' 4.02" (1.626 m), weight 72 kg, SpO2 98 %.    Vent Mode: PSV FiO2 (%):  [28 %-35 %] 28 % Set Rate:  [12 bmp] 12 bmp Vt Set:  [400 mL] 400 mL PEEP:  [5 cmH20] 5 cmH20 Pressure Support:  [5 cmH20] 5 cmH20 Plateau Pressure:  [18 cmH20] 18 cmH20   Intake/Output Summary (Last 24 hours) at 03/06/2021 1218 Last data filed at 03/06/2021 0825 Gross per 24 hour  Intake 1147.63 ml  Output 200 ml  Net 947.63 ml   Filed Weights   03/04/21 0448 03/05/21 0432 03/06/21 0424  Weight: 71.8 kg 72.4 kg 72 kg   Physical Examination  GENERAL: Acute on chronically ill-appearing female, laying in bed, on PSV on vent, no acute distress HEENT: Head atraumatic, normocephalic. Oropharynx and nasopharynx clear.  Neck:  Trachea midline, on trach collar.  Neck Supple, no jugular venous distention. No thyroid enlargement, no tenderness.  LUNGS: Normal breath sounds bilaterally, no wheezing, rales,rhonchi or crepitation. No use of accessory muscles of respiration.  CARDIOVASCULAR: S1, S2 normal. No murmurs, rubs, or gallops.  ABDOMEN: Soft, nontender,  nondistended.  Bowel sounds present. No organomegaly or mass.  PEG tube in place clean dry and intact EXTREMITIES: No pedal edema, cyanosis, or clubbing.  NEUROLOGIC: Sleeping, arouses easily to voice, follows commands on all 4 extremities, generalized weakness but no focal deficits, pupils PERRLA, nods to questions appropriately SKIN: No obvious rash, lesion, or ulcer.   Labs/imaging that I havepersonally reviewed  (right click and "Reselect all SmartList Selections" daily)     Labs   CBC: Recent Labs  Lab 03/02/21 0548 03/03/21 0947 03/04/21 0523 03/05/21 0356 03/06/21 0600  WBC 16.0* 15.6* 15.2* 13.0* 11.3*  HGB 12.9 11.9* 12.1 12.1 10.6*  HCT 41.4 39.5 39.2 39.4 34.9*  MCV 88.5 89.0 88.3 89.1 90.9  PLT 280 287 295 290 326     Basic Metabolic Panel: Recent Labs  Lab 02/28/21 0524 03/01/21 0508 03/02/21 0548 03/04/21 0523 03/05/21 0356 03/06/21 0600  NA 144 139 142 142 143 142  K 4.1 3.8 4.2 4.2 4.3 4.6  CL 107 98 97* 96* 97* 100  CO2 28 31 35* 36* 36* 33*  GLUCOSE 157* 218* 212* 294* 177* 178*  BUN 21 24* 30* 36* 36* 28*  CREATININE 0.93 0.88 0.93 0.80 0.76 0.67  CALCIUM 8.8* 9.0 9.2 9.9 10.0 9.4  MG 2.2  --   --  2.1 2.3 2.3  PHOS 2.2*  --   --  2.4* 4.1 2.9    GFR: Estimated Creatinine Clearance: 56.4 mL/min (by C-G formula based on SCr of 0.67 mg/dL). Recent Labs  Lab 03/03/21 0947 03/04/21 0523 03/05/21 0356 03/06/21 0600  PROCALCITON 0.17  --   --   --   WBC 15.6* 15.2* 13.0* 11.3*     Liver Function Tests: No results for input(s): AST, ALT, ALKPHOS, BILITOT, PROT, ALBUMIN in the last 168 hours.  No results for input(s): LIPASE, AMYLASE in the last 168 hours. No results for input(s): AMMONIA in the last 168 hours.  ABG    Component Value Date/Time   PHART >7.750 (HH) 03/04/2021 1039   PCO2ART 26 (L) 03/04/2021 1039   PO2ART 174 (H) 03/04/2021 1039   HCO3 45.4 (H) 03/05/2021 0427   ACIDBASEDEF 0.9 01/19/2020 1024   O2SAT 74.4 03/05/2021  0427      Coagulation Profile: No results for input(s): INR, PROTIME in the last 168 hours.   Cardiac Enzymes: No results for input(s): CKTOTAL, CKMB, CKMBINDEX, TROPONINI in the last 168 hours.  HbA1C: Hgb A1c MFr Bld  Date/Time Value Ref Range Status  01/20/2020 07:19 AM 6.3 (H) 4.8 - 5.6 % Final    Comment:    (NOTE) Pre diabetes:          5.7%-6.4%  Diabetes:              >6.4%  Glycemic control for   <7.0% adults with diabetes   03/21/2019 03:24 AM 6.6 (H) 4.8 - 5.6 % Final    Comment:    (NOTE) Pre diabetes:          5.7%-6.4% Diabetes:              >6.4% Glycemic control for   <7.0% adults with diabetes     CBG: Recent Labs  Lab 03/05/21 1933 03/05/21 2311 03/06/21 0351 03/06/21 0758 03/06/21 1202  GLUCAP 120* 156* 174* 176* 166*    Review of Systems:   Patient is trached unable to obtain ROS  Past Medical History  She,  has a past medical history of Breast cancer, right ([place]) (2002), Chronic  respiratory failure ([place]), Diabetic peripheral neuropathy ([place]) (08/28/2016), GERD (gastroesophageal reflux disease), Glaucoma, High cholesterol, History of colon polyps, Osteoarthritis, Personal history of chemotherapy (2002), Personal history of radiation therapy (2002), S/P percutaneous endoscopic gastrostomy (PEG) tube placement ([place]), T2DM (type 2 diabetes mellitus) ([place]), Tracheostomy dependent ([place]) (2012), Ventral hernia, and Vocal cord paralysis (2012).   Surgical History    Past Surgical History:  Procedure Laterality Date   ABDOMINAL HYSTERECTOMY     BREAST BIOPSY Right 2008   neg   BREAST BIOPSY Right 06/30/2020   "Q" clip-path pending   BREAST EXCISIONAL BIOPSY Right 2002   positive   BREAST LUMPECTOMY Right 2002   chemo and radiation   BREAST LUMPECTOMY WITH RADIOFREQUENCY TAG IDENTIFICATION Right 10/18/2020   Procedure: BREAST LUMPECTOMY WITH RADIOFREQUENCY TAG IDENTIFICATION;  Surgeon: Jules Husbands, MD;  Location: [facility] ORS;  Service:  General;  Laterality: Right;   BREAST SURGERY Right 2002   LUMPECTOMY, radiation, chemotherapy   CATARACT EXTRACTION, BILATERAL     CHOLECYSTECTOMY N/A 12/10/2017   Procedure: LAPAROSCOPIC CHOLECYSTECTOMY;  Surgeon: Benjamine Sprague, DO;  Location: [facility] ORS;  Service: General;  Laterality: N/A;   COLON RESECTION Right 11/06/2018   Procedure: HAND ASSISTED LAPAROSCOPIC COLON RESECTION, RIGHT;  Surgeon: Jules Husbands, MD;  Location: [facility] ORS;  Service: General;  Laterality: Right;   COLONOSCOPY  2009,12/30/2013   COLONOSCOPY WITH PROPOFOL N/A 04/16/2018   Tubulovillous polyp proximal transverse colon, large sessile polyp, proximal transverse colon;  Surgeon: Robert Bellow, MD;  Location: [facility] END[facility];  Service: Endoscopy;  Laterality: N/A;   ESOPHAGOGASTRODUODENOSCOPY (EGD) WITH PROPOFOL N/A 10/14/2018   Procedure: ESOPHAGOGASTRODUODENOSCOPY (EGD) WITH PROPOFOL;  Surgeon: Lucilla Lame, MD;  Location: [facility] END[facility];  Service: Endoscopy;  Laterality: N/A;   TRACHEAL SURGERY  2013   TRACHEOSTOMY N/A    Dr Kathyrn Sheriff   XI ROBOTIC ASSISTED VENTRAL HERNIA N/A 10/18/2020   Procedure: XI ROBOTIC ASSISTED VENTRAL HERNIA;  Surgeon: Jules Husbands, MD;  Location: [facility] ORS;  Service: General;  Laterality: N/A;     Social History   reports that she has never smoked. She has never used smokeless tobacco. She reports that she does not drink alcohol and does not use drugs.   Family History   Her family history includes Cancer in her brother, father, and sister. There is no history of Breast cancer.   Allergies Allergies  Allergen Reactions   Shellfish Allergy Shortness Of Breath    respitatory distress    Sodium Sulfite Shortness Of Breath    Severe Congestion that creates inability to breath   Sulfa Antibiotics Shortness Of Breath and Other (See Comments)    respitatory distress   Codeine Other (See Comments)    Per patient "it kept her up all night"   Glucerna [Alimentum] Itching   Wound  Dressing Adhesive Rash     Home Medications  Prior to Admission medications   Medication Sig Start Date End Date Taking? Authorizing Provider  acetaminophen (TYLENOL) 500 MG tablet Take 1,000 mg by mouth in the morning and at bedtime.   Yes [provider]  amoxicillin-clavulanate (AUGMENTIN) 875-125 MG tablet Take 1 tablet by mouth in the morning and at bedtime. 02/18/21 02/25/21 Yes [provider]  busPIRone (BUSPAR) 15 MG tablet Place 15 mg into feeding tube 2 (two) times daily. 09/26/20  Yes [provider]  carboxymethylcellulose (REFRESH PLUS) 0.5 % SOLN Place 1 drop into both eyes 3 (three) times daily as needed (dry eyes).   Yes [provider]  denosumab (PROLIA) 60 MG/ML SOSY injection Inject 60 mg into the skin every 6 (six) months.   Yes [provider]  EPINEPHrine 0.3 mg/0.3 mL IJ SOAJ injection epinephrine 0.3 mg/0.3 mL injection, auto-injector  Inject 0.3 mLs (0.3 mg dose) into the muscle once as needed for Anaphylaxis for up to 1 dose. 09/10/18  Yes [provider]  escitalopram (LEXAPRO) 10 MG tablet Place 3 tablets (30 mg total) into feeding tube daily. Patient taking differently: Place 10 mg into feeding tube daily. 02/13/20  Yes Kasa, Maretta Bees, MD  escitalopram (LEXAPRO) 20 MG tablet Place 20 mg into feeding tube daily.   Yes [provider]  folic acid (FOLVITE) 1 MG tablet Place 1 tablet (1 mg total) into feeding tube daily. 02/13/20  Yes Flora Lipps, MD  gabapentin (NEURONTIN) 600 MG tablet Place 600 mg into feeding tube 3 (three) times daily.   Yes [provider]  lovastatin (MEVACOR) 20 MG tablet Place 20 mg into feeding tube daily. 06/13/20  Yes [provider]  meclizine (ANTIVERT) 25 MG tablet Take 25 mg by mouth daily. 02/17/21  Yes [provider]  melatonin 5 MG TABS Place 1 tablet (5 mg total) into feeding tube at bedtime. 02/13/20  Yes Flora Lipps, MD  metFORMIN (GLUCOPHAGE)  1000 MG tablet Place 1,000 mg into feeding tube daily with breakfast. 06/10/20  Yes [provider]  metoprolol tartrate (LOPRESSOR) 25 MG tablet Place 25 mg into feeding tube daily. 06/13/20  Yes [provider]  Multiple Vitamins-Minerals (HAIR/SKIN/NAILS) TABS Take 1 tablet by mouth daily.   Yes [provider]  [facility] powder Apply topically. 01/18/21  Yes [provider]  pantoprazole (PROTONIX) 40 MG tablet Take 40 mg by mouth daily.   Yes [provider]  rOPINIRole (REQUIP) 0.5 MG tablet Place 1 tablet (0.5 mg total) into feeding tube at bedtime. 02/13/20  Yes Kasa, Maretta Bees, MD  sodium chloride 0.9 % nebulizer solution sodium chloride 0.9 % for nebulization  USE 5 ML BY NEBULIZTION AS NEEDED FOR WHEEZING 07/17/19  Yes [provider]  Scheduled Meds:  busPIRone  15 mg Per Tube BID   chlorhexidine gluconate (MEDLINE KIT)  15 mL Mouth Rinse BID   Chlorhexidine Gluconate Cloth  6 each Topical Q0600   enoxaparin (LOVENOX) injection  40 mg Subcutaneous QHS   escitalopram  30 mg Per Tube Daily   [START ON 03/07/2021] feeding supplement ([employer] [place] STANDARD 1.4)  1,440 mL Per Tube Daily   folic acid  1 mg Per Tube Daily   free water  50 mL Per Tube Q4H   insulin aspart  0-20 Units Subcutaneous Q4H   insulin detemir  10 Units Subcutaneous BID   ipratropium-albuterol  3 mL Nebulization TID   loratadine  10 mg Per Tube Daily   mouth rinse  15 mL Mouth Rinse 10 times per day   melatonin  5 mg Per Tube QHS   metoprolol tartrate  25 mg Per Tube Daily   multivitamin  15 mL Per Tube Daily   oseltamivir  30 mg Per Tube BID   pantoprazole sodium  40 mg Per Tube Daily   pravastatin  20 mg Per Tube q1800   Continuous Infusions:  lactated ringers 75 mL/hr at 03/06/21 0825   levofloxacin (LEVAQUIN) IV Stopped (03/05/21 1637)   PRN Meds:.acetaminophen, acetaminophen, docusate, ipratropium-albuterol, metoprolol tartrate  Assessment & Plan:   Acute  on Chronic Hypoxic & Respiratory Failure Secondary to suspected aspiration  pneumonia/healthcare associated pneumonia and influenza A viral pneumonia Hx: Tracheal stenosis requiring chronic tracheostomy -Full vent support, implement lung protective strategies -Plateau pressures less than 30 cm H20 -Wean FiO2 & PEEP as tolerated to maintain O2 sats >92% -Follow intermittent Chest X-ray & ABG as needed -Spontaneous Breathing Trials / Trach collar trials as tolerated -Implement VAP Bundle -Prn Bronchodilators -See antibiotic course as above -Continue Tamiflu  Sepsis due to Aspiration Pneumonia  Tracheal aspirate with  STENOTROPHOMONAS MALTOPHILIA & FLAVOBACTERIUM INDOLOGENES , PSEUDOMONAS AERUGINOSA (pan sensitive) & MODERATE SERRATIA MARCESCENS (resistant to Cefazolin) ~ ? COLONIZATION Suspected Abdominal Abscess   -Monitor fever curve -Trend WBC's & Procalcitonin -Follow cultures as above -Complete course Cefepime and Vancomycin -ID signed off -Continue empiric Levofloxacin and Tamiflu pending cultures & sensitivities  Possible subcutaneous periumbilical abscess CT Abdomen/Pelvis shows shows a potential small focal subcutaneous fluid collection to the left of the umbilicus measuring 2.4 x 1.5 x 1.2 cm concerning for possible abscess with G-tube is being pulled too tightly. -General surgery evaluated ~ No surgical issues ~  " CT scan pers reviewed and compared to prior CT earlier last week, there is some postoperative changes next to the hernia repair and small seroma but no abscess. Clinically no evidence of infection or abscess G tube in place.  Now able to tolerate TF at low rate" -Advance TF at slow rate as tolerated  Diabetes mellitus      -CBG's -SSI and Lantus -Follow ICU Hypo/hyperglycemia protocol    Nutritional Status/Diet -General Surgery ok to use PEG for feeds -Dietician following, TF's as tolerated -Constipation protocol as indicated        Best practice:  Diet:   Tube Feed  Pain/Anxiety/Delirium protocol (if indicated): No VAP protocol (if indicated): Yes DVT prophylaxis: Subcutaneous Heparin GI prophylaxis: PPI Glucose control:  SSI Yes Central venous access:  N/A Arterial line:  N/A Foley:  N/A Mobility:  bed rest  PT consulted: N/A Last date of multidisciplinary goals of care discussion [1/9] Code Status:  limited Disposition: Step down   = Goals of Care = Code Status Order: PARTIAL  Primary Emergency Contact: Landing,Joseph, Home Phone: [phone] Patient wishes to pursue ongoing treatment with relatively conservative measures (e.g., IV fluids, antibiotics, Defibrillation, Intubation, ACLS meds), but in the event of cardiac or respiratory arrest would not wish to have CPR.  Critical care time: 40 minutes     Darel Hong, AGACNP-BC [facility] Prefer epic messenger for cross cover needs If after hours, please call E-link   .

## 2021-03-06 NOTE — Progress Notes (Signed)
Called [facility] and spoke to [place], scheduled [facility] to come and pick patient up at 0830pm from [place] and [facility]. Dr[facility] Orma Render is the [facility] at [facility] and we are not to call report to [facility] until after 62pm

## 2021-03-06 NOTE — Progress Notes (Signed)
Nutrition Follow Up Note   DOCUMENTATION CODES:   Not applicable  INTERVENTION:   [employer] [place] Standard 1.4 formula continuous _0 /hr  Free water flushes 40m q4 hours to maintain tube patency   Regimen provides 2016kcal/day, 89g/day protein and 13373mday free water   NUTRITION DIAGNOSIS:   Inadequate oral intake related to dysphagia as evidenced by NPO status (pt with chronic G-tube).  GOAL:   Patient will meet greater than or equal to 90% of their needs -met with tube feeds   MONITOR:   Labs, Weight trends, Skin, I & O's, TF tolerance  ASSESSMENT:   [age]ear old female with PMHx of breast cancer s/p lumpectomy and chemo/XRT, DM, diabetic peripheral neuropathy, GERD, vocal cord paralysis, tracheal stenosis requiring chronic tracheostomy, aspiration PNA, chronic G-tube (placed surgically 01/29/2020), s/p hand-assisted laparoscopic right hemicolectomy by Dr. PaDahlia Byesn 11/06/2018 and s/p right partial mastectomy and robotic assisted incisional ventral hernia repair on 10/18/2020 who is now admitted with sepsis and respiratory distress. Pt found to have flu A  Visited pt's room today. Pt is able to answer yes and no by nodding her head. Pt remains on the ventilator. Plan is for TC trials today. G tube in place; pt tolerating tube feeds well at 3088mr. Pt never increased to goal rate last week; will increase to goal rate today. Per chart, pt is down 11lbs since admission; pt down ~3lbs from her UBW. Pt -5.8L on her I & Os. [facility]   Medications reviewed and include: lovenox, folic acid, insulin, MVI, protonix, LRS_1 /hr  Labs reviewed: K 4.6 wnl, BUN 28(H), P 2.9wnl, Mg 2.3 wnl Wbc- 11.3 wnl, Hgb 10.6(L), Hct 34.9(L) Cbgs- 166, 176, 174 x 24 hrs  Patient is currently intubated on ventilator support MV: 8.2 L/min Temp (24hrs), Avg:98.1 F (36.7 C), Min:97.7 F (36.5 C), Max:98.8 F (37.1 C)  Propofol: none   MAP- >8m38m  UOP- 200ml37mutrition  Focused Physical Exam:   Flowsheet Row Most Recent Value  Orbital Region No depletion  Upper Arm Region Mild depletion  Thoracic and Lumbar Region No depletion  Buccal Region No depletion  Temple Region Mild depletion  Clavicle Bone Region Severe depletion  Clavicle and Acromion Bone Region Severe depletion  Scapular Bone Region No depletion  Dorsal Hand No depletion  Patellar Region No depletion  Anterior Thigh Region No depletion  Posterior Calf Region No depletion  Edema (RD Assessment) None  Hair Reviewed  Eyes Reviewed  Mouth Reviewed  Skin Reviewed  Nails Reviewed   Diet Order:   Diet Order             Diet NPO time specified  Diet effective midnight                  EDUCATION NEEDS:   Education needs have been addressed  Skin:  Skin Assessment: Reviewed RN Assessment (ecchymosis)  Last BM:  1/8- type 7  Height:   Ht Readings from Last 1 Encounters:  03/01/21 5' 4.02" (1.626 m)    Weight:   Wt Readings from Last 1 Encounters:  03/06/21 72 kg    Ideal Body Weight:  54.5 kg  BMI:  Body mass index is 27.23 kg/m.  Estimated Nutritional Needs:   Kcal:  1700-1900kcal/day  Protein:  85-95g/day  Fluid:  1.4-1.6L/day  CaseyKoleen DistanceRD, LDN Please refer to [facility] and/or RD on-call/weekend/after hours pager

## 2021-03-06 NOTE — Progress Notes (Signed)
Update to [facility] at [facility] she will be going to room 321 and the unit number to call with report is [phone].

## 2021-03-06 NOTE — Discharge Summary (Signed)
Physician Discharge Summary  Patient ID: Angela David MRN: [id number] DOB/AGE: [birth year] 79 y.o.  Admit date: 02/20/2021 Discharge date: 03/06/2021   Brief Pt Description / Synopsis:  81 y.o. female admitted with acute on chronic hypoxic respiratory failure requiring chronic tracheostomy due to vocal cord paralysis admitted with suspected aspiration pneumonia/healthcare associated pneumonia and influenza A viral infection requiring mechanical ventilation.   Discharge Diagnoses:   Acute on Chronic Hypoxic Respiratory Failure Suspected Healthcare Associated Pneumonia vs. Colonization Aspiration Pneumonia Influenza A Viral Infection Sepsis Diabetes Mellitus                                                            Discharge Summary:  81 y.o with significant PMH of remote breast cancer in 2002 s/p chemo and radiation, DM2, ventral hernia s/p robotic incisional hernia repair and right upper outer quadrant mass s/p RFA guided right lumpectomy (09/2020), peripheral neuropathy, GERD, vocal cord paralysis s/p chronic trach and PEG who presented to the ED with chief complaints of fevers, chills and SOB.   Patient reports having some pain, irritation and drainage around the PEG site since Friday. Later she developed fever, chills, generalized  malaise and SOB. Patient was started on Augmentin for suspected cellulitis of  around PEG site with no improvement.   ED Course: On arrival to the ED, she was febrile 101.5 (38.6) with blood pressure 126/65 mm Hg and pulse rate 111 beats/min, RR 27 with sats 87% on RA. There were no focal neurological deficits;  Patient mildly tachycardic mildly tachypneic hypoxic with mild abdominal pain. Given recent treatment for cellulitis, CT imaging was ordered.  Pertinent Labs in Red/Diagnostics Findings: Na+/ K+: 134/3.9 Glucose: 217 BUN/Cr.: 18/0.94 AST/ALT:59/41   WBC/ TMAX: 10.0/ afebrile PCT: pending Lactic acid: 2.5 COVID PCR: Negative    Patient had a CT scan of the abdomen pelvis today which shows a potential small focal subcutaneous fluid collection to the left of the umbilicus measuring 2.4 x 1.5 x 1.2 cm concerning for possible abscess. Surgery consulted for further evaluation.  Patient also with O2 requirement and possibly developing pneumonia but no infiltrates on chest x-ray. Patient was started on broad spectrum antibiotics and admitted to hospitalist service. PCCM consulted to assist with vent management.    Please see "Significant Events" section below for full detailed hospital course.  Significant Hospital Events   12/26: Admitted to stepdown with sespsis secondary to suspected abdominal abscess and possible early pneumonia 12/26: General surgery consulted 12/27: Remains on full vent support. 12/28: Remains agitated on the vent. CT head and chest obtained 12/30: Patient with episode of respiratory distress due to suspected aspiration. Bolus feed held.STAT CXR performed which revealed a worsening R pleural effusion. 12/31: Remains on the vent. 02/27/21- patient remains on ventilator. S/p sugery evaluation of PEG tube. Resp cultures are stenotrophomonas.  She has pneumonia with parapneumonic effusion concerning for possible empyema. [name] at bedside reveals no pleural effusion bilaterally 02/29/20- patient weaned down on FIO2 today , will attemp SBT in am for liberation from MV. Diuresed well today >3L with improved resp status 03/01/21- patient exhibiting signs of severe derpression , she voiced not wanting to continue treatment medically, I have placed consult for palliative care and psychiatry. 03/02/21-  Patient still with what appears to be deprssion. I met with  husband today and patient was evaluated by psychiatry today.  03/03/21-  patient seems to have component of pneumonia induced tachycardia and possible septic encephalopathy.  ID consult due to unclear infection status 03/04/21- patient continues to be somnolent and is with  bacterial and viral pneumonia with septic enceophalpathy. 03/05/21-patient is essentially unimproved overnight.  She has been oliguric today and received IVF.  03/06/21: Tolerating TC trial   Discharge Plan by Diagnosis:  Acute on Chronic Hypoxic & Respiratory Failure Secondary to suspected aspiration pneumonia, ? healthcare associated pneumonia (? COLONIZATION) and influenza A viral infection Hx: Tracheal stenosis requiring chronic tracheostomy -Full vent support, implement lung protective strategies -Plateau pressures less than 30 cm H20 -Wean FiO2 & PEEP as tolerated to maintain O2 sats >92% -Follow intermittent Chest X-ray & ABG as needed -Spontaneous Breathing Trials / Trach collar trials as tolerated -Implement VAP Bundle -Prn Bronchodilators -See antibiotic course as above -Continue Tamiflu   Sepsis due to Aspiration Pneumonia  Tracheal aspirate with  STENOTROPHOMONAS MALTOPHILIA & FLAVOBACTERIUM INDOLOGENES , PSEUDOMONAS AERUGINOSA (pan sensitive) & MODERATE SERRATIA MARCESCENS (resistant to Cefazolin) ~ ? COLONIZATION Suspected Abdominal Abscess   -Monitor fever curve -Trend WBC's & Procalcitonin -Follow cultures as above -Complete course Cefepime and Vancomycin -ID signed off -Continue empiric Levofloxacin and Tamiflu pending cultures & sensitivities   Possible subcutaneous periumbilical abscess CT Abdomen/Pelvis shows shows a potential small focal subcutaneous fluid collection to the left of the umbilicus measuring 2.4 x 1.5 x 1.2 cm concerning for possible abscess with G-tube is being pulled too tightly. -General surgery evaluated ~ No surgical issues ~  " CT scan pers reviewed and compared to prior CT earlier last week, there is some postoperative changes next to the hernia repair and small seroma but no abscess. Clinically no evidence of infection or abscess G tube in place.  Now able to tolerate TF at low rate" -Advance TF at slow rate as tolerated   Diabetes mellitus       -CBG's -SSI and Lantus -Follow ICU Hypo/hyperglycemia protocol    Nutritional Status/Diet -General Surgery ok to use PEG for feeds -Dietician following, TF's as tolerated -Constipation protocol as indicated                      Significant Diagnostic Studies:  12/26: Chest Xray>No active cardiopulmonary disease. 12/26: CTA abdomen and pelvis> shows a potential small focal subcutaneous fluid collection to the left of the umbilicus measuring 2.4 x 1.5 x 1.2 cm concerning for possible abscess 12/28: CT head>>IMPRESSION: 1. Stable age related cerebral atrophy, ventriculomegaly and advanced periventricular white matter disease. 2. No acute intracranial findings or mass lesions. 12/28: CT chest>>1. Moderate-sized right pleural effusion with overlying atelectasis. 2. Peribronchial thickening and bilateral lower lobe airspace consolidation likely pneumonia. Aspiration would certainly be a consideration given the appearance of the esophagus. 3. Stable markedly dilated esophagus suggesting achalasia. 4. Stable scattered bone lesions and healed rib fractures. 5. Aortic atherosclerosis. 1/1: CT abdomen pelvis with contrast>>IMPRESSION: Small bilateral pleural effusions are noted with adjacent subsegmental atelectasis or infiltrates, right greater than left. Stable size and appearance of rounded abnormality seen to the left of the umbilicus in the subcutaneous tissues in the anterior abdominal wall which may represent small abscess as noted on prior exam. Stable small fat containing supraumbilical ventral hernia. Hepatic steatosis. Aortic Atherosclerosis            Micro Data:  12/26: SARS-CoV-2 PCR> negative 12/26: Influenza PCR> negative 12/26: Blood culture x2>no growth  12/26: MRSA PCR>> negative 12/26: Respiratory viral panel>>negative 12/26: Strep pneumo urinary antigen>negative 12/26: Legionella urinary antigen>negative 12/30: Tracheal aspirate>>STENOTROPHOMONAS MALTOPHILIA  & FLAVOBACTERIUM INDOLOGENES (sensitive to ciprofoxacin, zosyn, bactrim) 1/1: Blood culture x2>> no growth 1/1: Urine>>no growth 1/5: Tracheal aspirate>> MODERATE PSEUDOMONAS AERUGINOSA (pan sensitive) & MODERATE SERRATIA MARCESCENS (resistant to Cefazolin) 1/6: Influenza A PCR>> Positive   Antimicrobials:  Metronidazole 12/26 x1 dose; restarted 12/30>>1/2 Cefepime 12/26 >>1/2  Vancomycin 12/26>>1/2  Levofloxacin 1/5>> Oseltamivir 1/6>>   Consults:  PCCM General Surgery Infectious Diseases Palliative Care Psychiatry   Discharge Exam:   GENERAL: Acute on chronically ill-appearing female, laying in bed, on PSV on vent, no acute distress HEENT: Head atraumatic, normocephalic. Oropharynx and nasopharynx clear.  Neck:  Trachea midline, on trach collar.  Neck Supple, no jugular venous distention. No thyroid enlargement, no tenderness.  LUNGS: Normal breath sounds bilaterally, no wheezing, rales,rhonchi or crepitation. No use of accessory muscles of respiration.  CARDIOVASCULAR: S1, S2 normal. No murmurs, rubs, or gallops.  ABDOMEN: Soft, nontender, nondistended. Bowel sounds present. No organomegaly or mass.  PEG tube in place clean dry and intact EXTREMITIES: No pedal edema, cyanosis, or clubbing.  NEUROLOGIC: Sleeping, arouses easily to voice, follows commands on all 4 extremities, generalized weakness but no focal deficits, pupils PERRLA, nods to questions appropriately SKIN: No obvious rash, lesion, or ulcer  Vitals:   03/06/21 1035 03/06/21 1100 03/06/21 1200 03/06/21 1338  BP:  (!) 112/50 (!) 109/47   Pulse: 77 79 84   Resp: _0 Temp:   97.9 F (36.6 C)   TempSrc:   Oral   SpO2: 98% 99% 98% 98%  Weight:      Height:         Discharge Labs:   BMET Recent Labs  Lab 02/28/21 0524 03/01/21 0508 03/02/21 0548 03/04/21 0523 03/05/21 0356 03/06/21 0600  NA 144 139 142 142 143 142  K 4.1 3.8 4.2 4.2 4.3 4.6  CL 107 98 97* 96* 97* 100  CO2 28 31 35* 36*  36* 33*  GLUCOSE 157* 218* 212* 294* 177* 178*  BUN 21 24* 30* 36* 36* 28*  CREATININE 0.93 0.88 0.93 0.80 0.76 0.67  CALCIUM 8.8* 9.0 9.2 9.9 10.0 9.4  MG 2.2  --   --  2.1 2.3 2.3  PHOS 2.2*  --   --  2.4* 4.1 2.9    CBC Recent Labs  Lab 03/04/21 0523 03/05/21 0356 03/06/21 0600  HGB 12.1 12.1 10.6*  HCT 39.2 39.4 34.9*  WBC 15.2* 13.0* 11.3*  PLT 295 290 326    Anti-Coagulation No results for input(s): INR in the last 168 hours.        Allergies as of 03/06/2021       Reactions   Shellfish Allergy Shortness Of Breath   respitatory distress    Sodium Sulfite Shortness Of Breath   Severe Congestion that creates inability to breath   Sulfa Antibiotics Shortness Of Breath, Other (See Comments)   respitatory distress   Codeine Other (See Comments)   Per patient "it kept her up all night"   Glucerna [alimentum] Itching   Wound Dressing Adhesive Rash        Medication List     STOP taking these medications    acetaminophen 500 MG tablet Commonly known as: TYLENOL Replaced by: acetaminophen 650 MG suppository   amoxicillin-clavulanate 875-125 MG tablet Commonly known as: AUGMENTIN   busPIRone 15 MG tablet Commonly known as: BUSPAR  carboxymethylcellulose 0.5 % Soln Commonly known as: REFRESH PLUS   denosumab 60 MG/ML Sosy injection Commonly known as: PROLIA   EPINEPHrine 0.3 mg/0.3 mL Soaj injection Commonly known as: EPI-PEN   folic acid 1 MG tablet Commonly known as: FOLVITE   gabapentin 600 MG tablet Commonly known as: NEURONTIN   Hair/Skin/Nails Tabs   lovastatin 20 MG tablet Commonly known as: MEVACOR Replaced by: pravastatin 20 MG tablet   meclizine 25 MG tablet Commonly known as: ANTIVERT   melatonin 5 MG Tabs   metFORMIN 1000 MG tablet Commonly known as: GLUCOPHAGE   Nyamyc powder Generic drug: nystatin   pantoprazole 40 MG tablet Commonly known as: PROTONIX Replaced by: pantoprazole sodium 40 mg   rOPINIRole 0.5 MG  tablet Commonly known as: REQUIP   sodium chloride 0.9 % nebulizer solution       TAKE these medications    acetaminophen 650 MG suppository Commonly known as: TYLENOL Place 1 suppository (650 mg total) rectally every 4 (four) hours as needed for fever. Replaces: acetaminophen 500 MG tablet   acetaminophen 325 MG tablet Commonly known as: TYLENOL Place 2 tablets (650 mg total) into feeding tube every 6 (six) hours as needed for fever.   chlorhexidine gluconate (MEDLINE KIT) 0.12 % solution Commonly known as: PERIDEX 15 mLs by Mouth Rinse route 2 (two) times daily.   Chlorhexidine Gluconate Cloth 2 % Pads Apply 6 each topically daily at 6 (six) AM. Start taking on: March 07, 2021   diphenhydrAMINE 25 mg capsule Commonly known as: BENADRYL Take 1 capsule (25 mg total) by mouth every 6 (six) hours as needed for allergies or itching.   docusate 50 MG/5ML liquid Commonly known as: COLACE Place 10 mLs (100 mg total) into feeding tube daily as needed for mild constipation.   enoxaparin 40 MG/0.4ML injection Commonly known as: LOVENOX Inject 0.4 mLs (40 mg total) into the skin at bedtime.   escitalopram 10 MG tablet Commonly known as: LEXAPRO Place 3 tablets (30 mg total) into feeding tube daily. Start taking on: March 07, 2021 What changed:  how much to take Another medication with the same name was removed. Continue taking this medication, and follow the directions you see here.   feeding supplement (KATE FARMS STANDARD 1.4) Liqd liquid Place 1,440 mLs into feeding tube daily. Start taking on: March 07, 2021   free water Soln Place 50 mLs into feeding tube every 4 (four) hours.   insulin aspart 100 UNIT/ML injection Commonly known as: novoLOG Inject 0-20 Units into the skin every 4 (four) hours.   insulin detemir 100 UNIT/ML injection Commonly known as: LEVEMIR Inject 0.1 mLs (10 Units total) into the skin 2 (two) times daily.   ipratropium-albuterol  0.5-2.5 (3) MG/3ML Soln Commonly known as: DUONEB Take 3 mLs by nebulization every 2 (two) hours as needed.   ipratropium-albuterol 0.5-2.5 (3) MG/3ML Soln Commonly known as: DUONEB Take 3 mLs by nebulization 3 (three) times daily.   lactated ringers infusion Inject 75 mLs into the vein continuous.   levofloxacin 750 MG/150ML Soln Commonly known as: LEVAQUIN Inject 150 mLs (750 mg total) into the vein daily. Start taking on: March 07, 2021   loratadine 10 MG tablet Commonly known as: CLARITIN Place 1 tablet (10 mg total) into feeding tube daily. Start taking on: March 07, 2021   metoprolol tartrate 5 MG/5ML Soln injection Commonly known as: LOPRESSOR Inject 5 mLs (5 mg total) into the vein every 6 (six) hours as needed (tachycardia sustained >/=  HR 100). What changed: You were already taking a medication with the same name, and this prescription was added. Make sure you understand how and when to take each.   metoprolol tartrate 25 MG tablet Commonly known as: LOPRESSOR Place 1 tablet (25 mg total) into feeding tube daily. Start taking on: March 07, 2021 What changed: Another medication with the same name was added. Make sure you understand how and when to take each.   mouth rinse Liqd solution 15 mLs by Mouth Rinse route every 2 (two) hours.   multivitamin Liqd Place 15 mLs into feeding tube daily. Start taking on: March 07, 2021   oseltamivir 30 MG capsule Commonly known as: TAMIFLU Place 1 capsule (30 mg total) into feeding tube 2 (two) times daily.   pantoprazole sodium 40 mg Commonly known as: PROTONIX Place 40 mg into feeding tube daily. Replaces: pantoprazole 40 MG tablet   pravastatin 20 MG tablet Commonly known as: PRAVACHOL Place 1 tablet (20 mg total) into feeding tube daily at 6 PM. Replaces: lovastatin 20 MG tablet            Disposition: [facility]  Discharged Condition: Angela David has met maximum benefit of inpatient  care and is medically stable for LTACH     Time spent on disposition:  50 Minutes.     Signed: Darel Hong, AGACNP-BC [facility] Prefer epic messenger for cross cover needs If after hours, please call E-link

## 2021-03-06 NOTE — Plan of Care (Signed)
PMT note:  Patient is resting in bed, no family at bedside. Per notes and conversation with staff, patient and family want continued aggressive care. Will follow peripherally. Please call for more immediate needs.

## 2021-03-13 DIAGNOSIS — F419 Anxiety disorder, unspecified: Secondary | ICD-10-CM

## 2021-03-13 DIAGNOSIS — R6520 Severe sepsis without septic shock: Secondary | ICD-10-CM

## 2021-03-13 DIAGNOSIS — J189 Pneumonia, unspecified organism: Secondary | ICD-10-CM

## 2021-03-13 DIAGNOSIS — J9621 Acute and chronic respiratory failure with hypoxia: Secondary | ICD-10-CM

## 2021-03-14 DIAGNOSIS — J9621 Acute and chronic respiratory failure with hypoxia: Secondary | ICD-10-CM

## 2021-03-14 DIAGNOSIS — F419 Anxiety disorder, unspecified: Secondary | ICD-10-CM

## 2021-03-14 DIAGNOSIS — R6520 Severe sepsis without septic shock: Secondary | ICD-10-CM

## 2021-03-14 DIAGNOSIS — J189 Pneumonia, unspecified organism: Secondary | ICD-10-CM

## 2021-03-15 DIAGNOSIS — F419 Anxiety disorder, unspecified: Secondary | ICD-10-CM

## 2021-03-15 DIAGNOSIS — J9621 Acute and chronic respiratory failure with hypoxia: Secondary | ICD-10-CM

## 2021-03-15 DIAGNOSIS — R6520 Severe sepsis without septic shock: Secondary | ICD-10-CM

## 2021-03-15 DIAGNOSIS — J189 Pneumonia, unspecified organism: Secondary | ICD-10-CM

## 2021-03-16 ENCOUNTER — Ambulatory Visit: Payer: Medicare Other | Admitting: Internal Medicine

## 2021-03-16 ENCOUNTER — Encounter: Payer: Self-pay | Admitting: Internal Medicine

## 2021-03-16 DIAGNOSIS — J189 Pneumonia, unspecified organism: Secondary | ICD-10-CM

## 2021-03-16 DIAGNOSIS — J9621 Acute and chronic respiratory failure with hypoxia: Secondary | ICD-10-CM

## 2021-03-16 DIAGNOSIS — R6520 Severe sepsis without septic shock: Secondary | ICD-10-CM

## 2021-03-16 DIAGNOSIS — A419 Sepsis, unspecified organism: Secondary | ICD-10-CM

## 2021-03-17 DIAGNOSIS — J189 Pneumonia, unspecified organism: Secondary | ICD-10-CM

## 2021-03-17 DIAGNOSIS — J9621 Acute and chronic respiratory failure with hypoxia: Secondary | ICD-10-CM

## 2021-03-17 DIAGNOSIS — A419 Sepsis, unspecified organism: Secondary | ICD-10-CM

## 2021-03-17 DIAGNOSIS — R6520 Severe sepsis without septic shock: Secondary | ICD-10-CM

## 2021-03-18 DIAGNOSIS — J9621 Acute and chronic respiratory failure with hypoxia: Secondary | ICD-10-CM

## 2021-03-18 DIAGNOSIS — J189 Pneumonia, unspecified organism: Secondary | ICD-10-CM

## 2021-03-18 DIAGNOSIS — A419 Sepsis, unspecified organism: Secondary | ICD-10-CM

## 2021-03-18 DIAGNOSIS — R6520 Severe sepsis without septic shock: Secondary | ICD-10-CM

## 2021-03-19 DIAGNOSIS — J9621 Acute and chronic respiratory failure with hypoxia: Secondary | ICD-10-CM

## 2021-03-19 DIAGNOSIS — A419 Sepsis, unspecified organism: Secondary | ICD-10-CM

## 2021-03-19 DIAGNOSIS — R6520 Severe sepsis without septic shock: Secondary | ICD-10-CM

## 2021-03-19 DIAGNOSIS — J189 Pneumonia, unspecified organism: Secondary | ICD-10-CM

## 2021-03-27 IMAGING — CR DG CHEST 2V
1 series · 2 of 2 positions shown · non-contrast
Comparison: 02/03/2019 chest radiograph.

CLINICAL DATA: Dyspnea, history of right rib fractures and right
breast cancer.

EXAM:
CHEST - 2 VIEW

[Series 1: dg chest 2 view · 0.14mm/px · 2 of 2 slices shown]
[im 1/2]
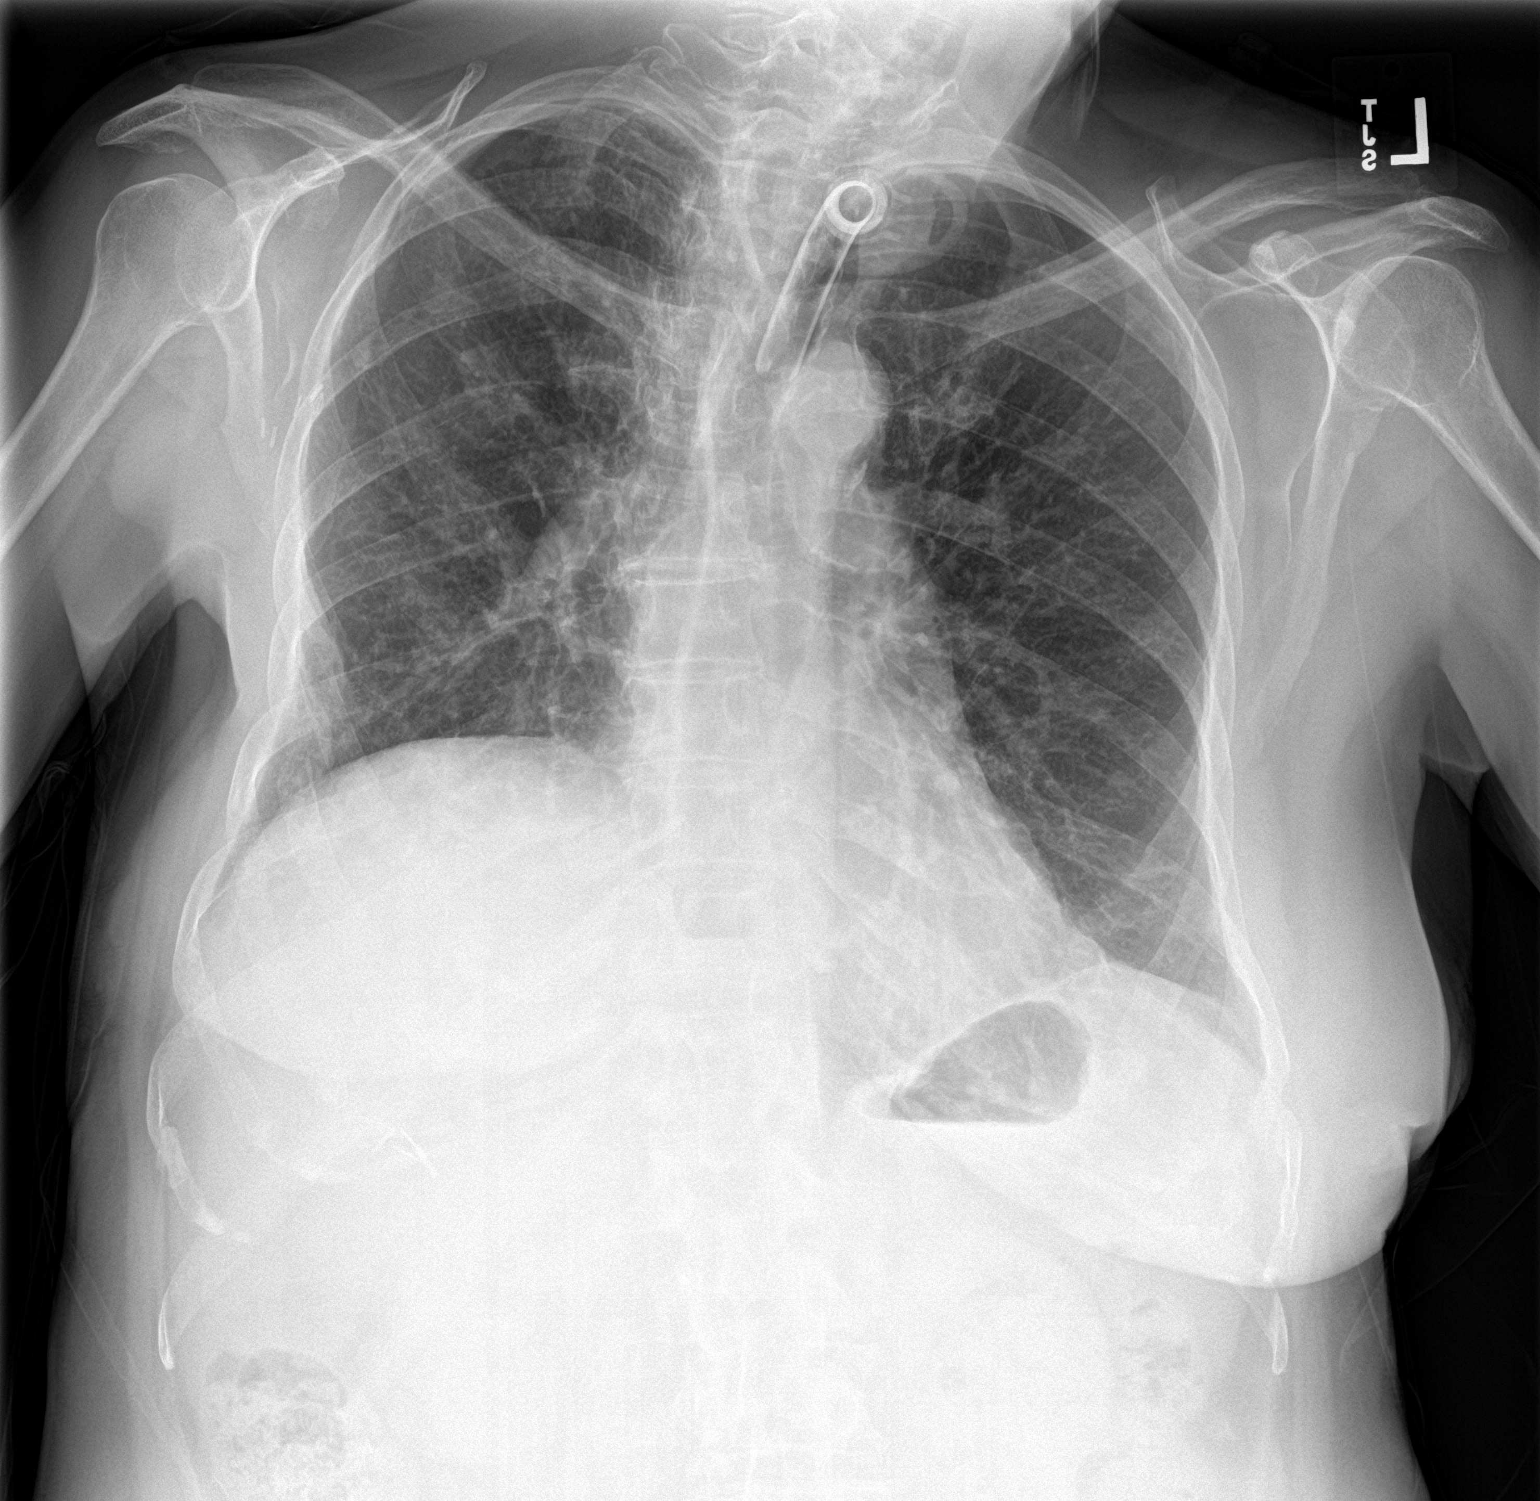
[im 2/2]
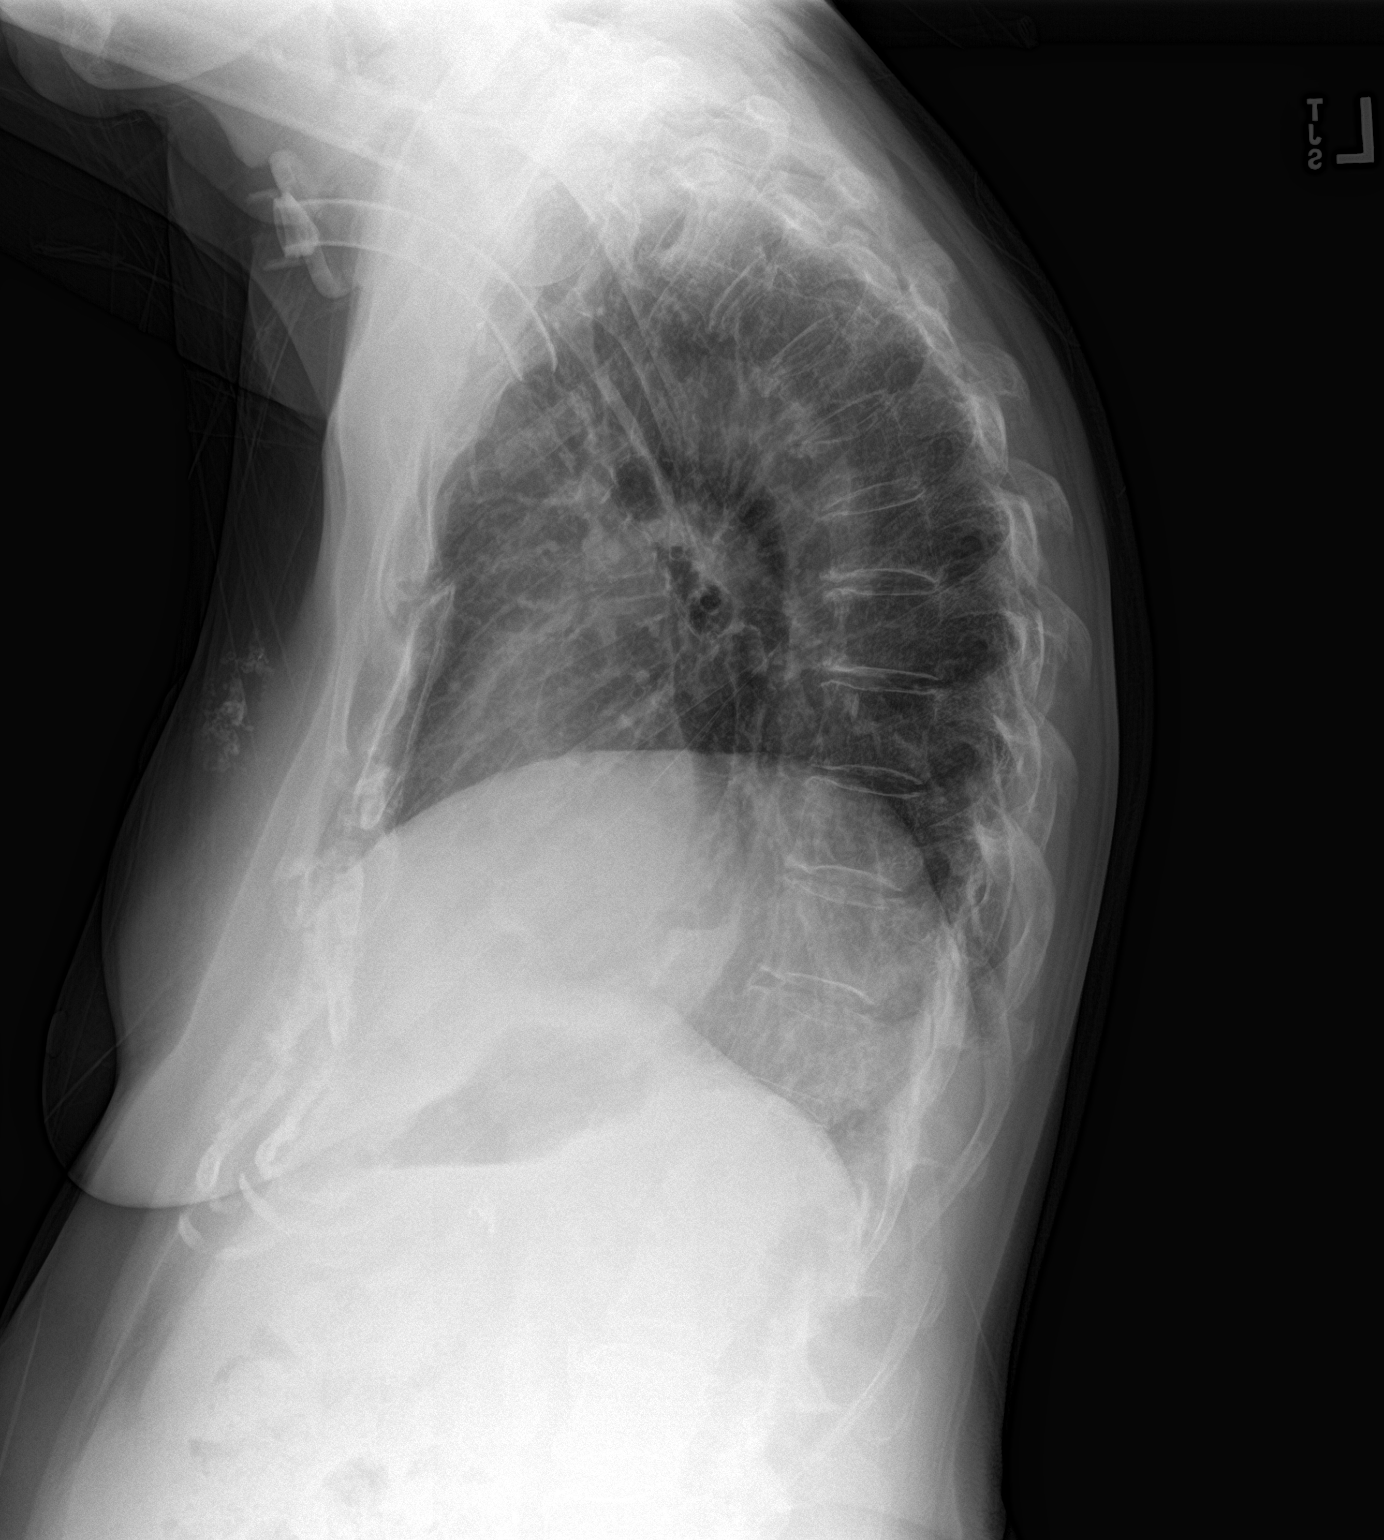

[2 of 2 positions shown; findings below may reference images not displayed]

FINDINGS: Tracheostomy tube terminates 2.6 cm above the carina in the tracheal
air column. Stable cardiomediastinal silhouette with normal heart
size. No pneumothorax. Stable moderate elevation of the right
hemidiaphragm. No pleural effusions. No pulmonary edema. No acute
consolidative airspace disease. Stable depressed sternal fracture.
IMPRESSION: No acute cardiopulmonary disease. Chronic moderate elevation of the
right hemidiaphragm. Well-positioned tracheostomy tube.

## 2021-03-30 ENCOUNTER — Ambulatory Visit: Payer: Medicare Other | Admitting: Neurology

## 2021-04-12 IMAGING — DX DG CHEST 1V PORT
1 series · 1 of 1 positions shown · non-contrast
Comparison: 03/04/2019, 02/03/2019

CLINICAL DATA: Hypoxia

EXAM:
PORTABLE CHEST 1 VIEW

[chest ap]
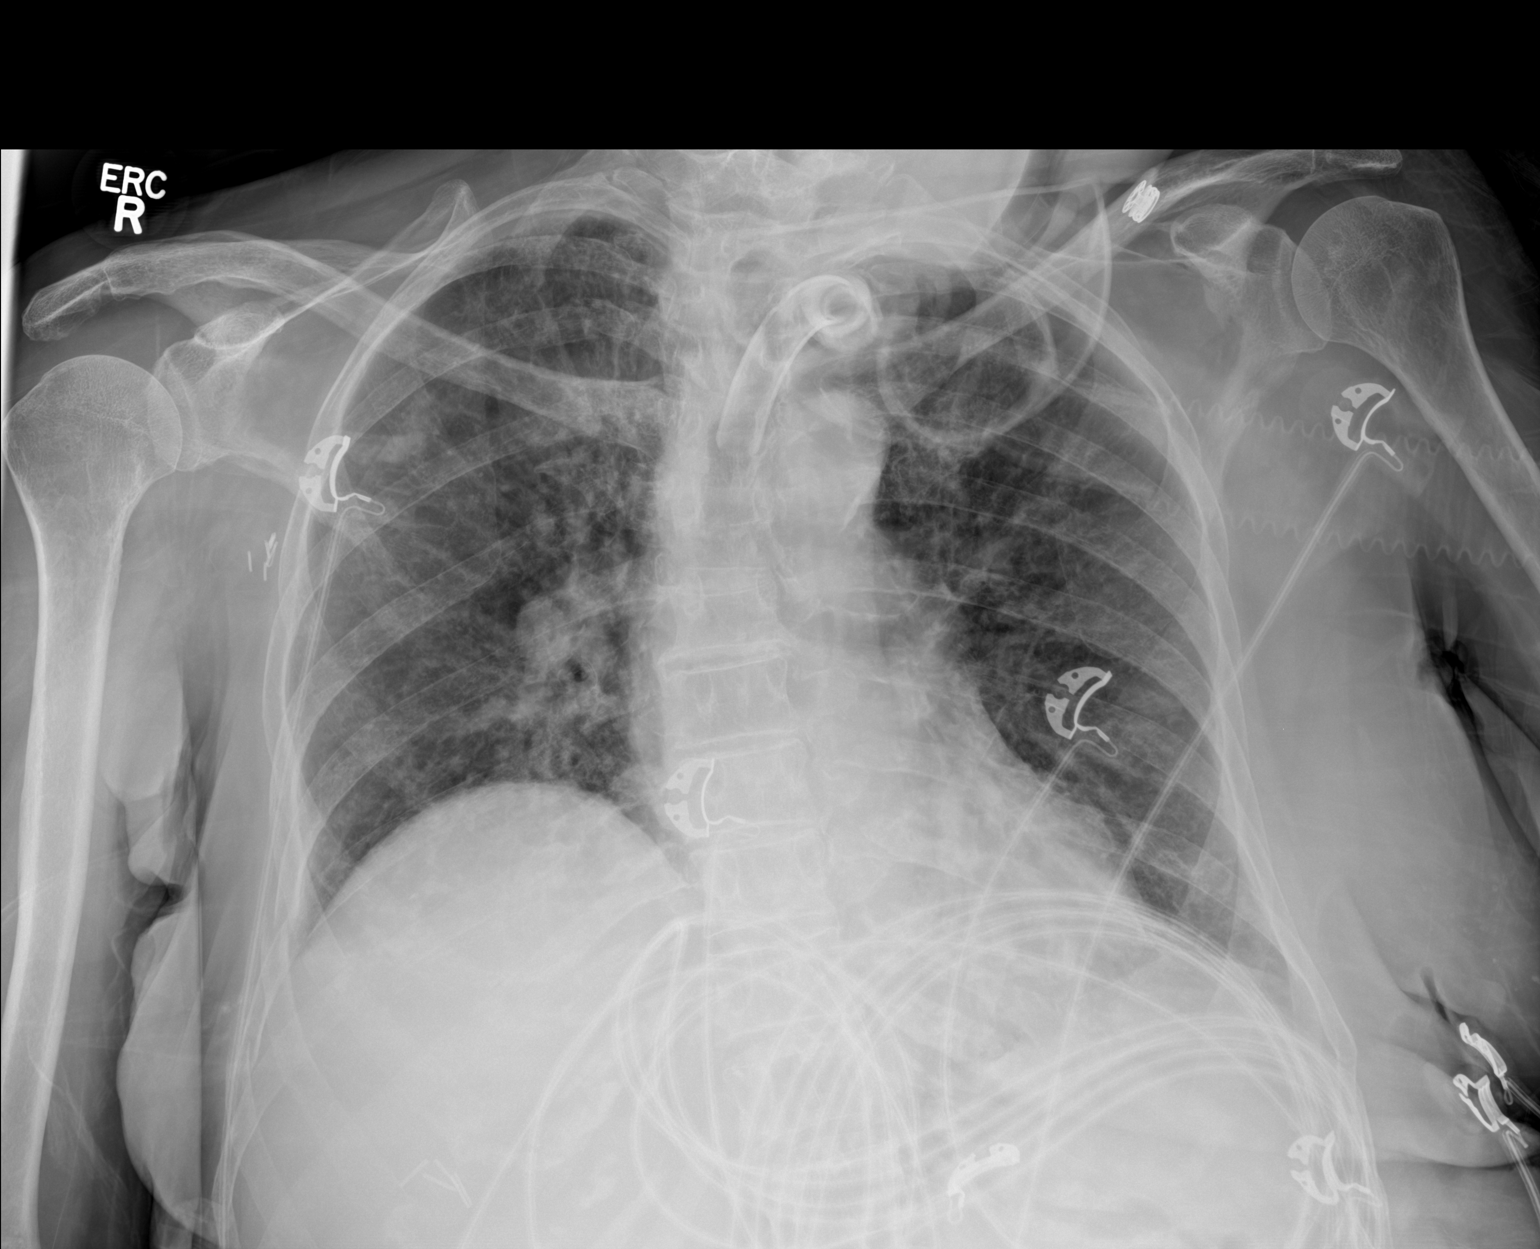

[1 of 1 positions shown; findings below may reference images not displayed]

FINDINGS: Tracheostomy tube is in place. Low lung volumes. Streaky opacity
left base likely atelectasis. Stable cardiomediastinal silhouette.
Aortic atherosclerosis. No pneumothorax.
IMPRESSION: No active disease. Low lung volumes with streaky left basilar
atelectasis

## 2021-04-13 IMAGING — MR MR HEAD W/O CM
10 of 11 series · 36 of 48 positions shown · non-contrast
Comparison: Head CT 03/21/2019, brain MRI 07/20/2014

CLINICAL DATA: Neuro deficit, subacute. Additional history
provided:

EXAM:
MRI HEAD WITHOUT CONTRAST
TECHNIQUE: Multiplanar, multiecho pulse sequences of the brain and surrounding
structures were obtained without intravenous contrast.

[Series 9: ax dwi_tracew · axial · 3.0mm · 0.60mm/px · z∈[+9,+131]mm · 3 of 46 slices shown]
[im 1/46]
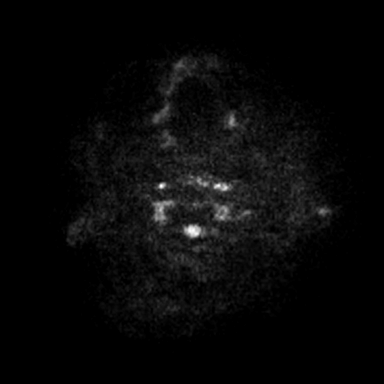
[im 23/46]
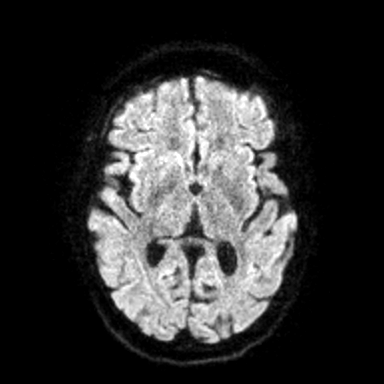
[im 46/46]
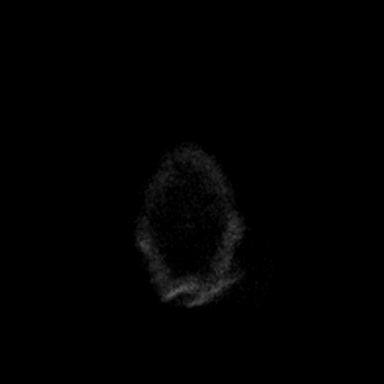

[Series 10: ax dwi_adc · axial · 3.0mm · 0.60mm/px · z∈[+9,+131]mm · 4 of 46 slices shown]
[im 1/46]
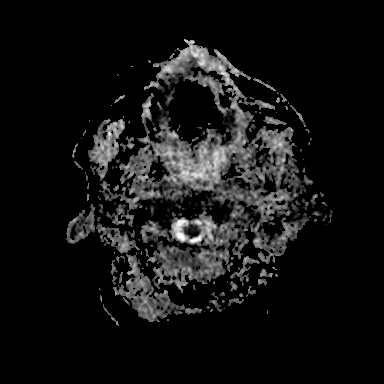
[im 16/46]
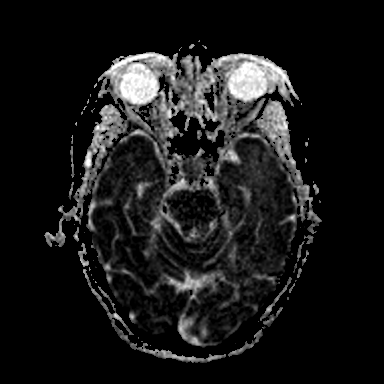
[im 31/46]
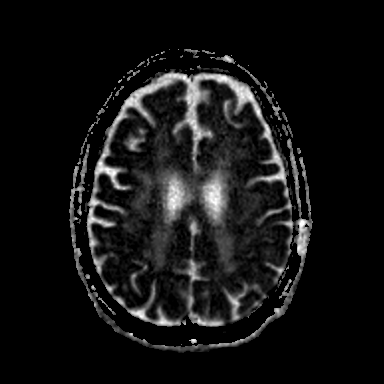
[im 46/46]
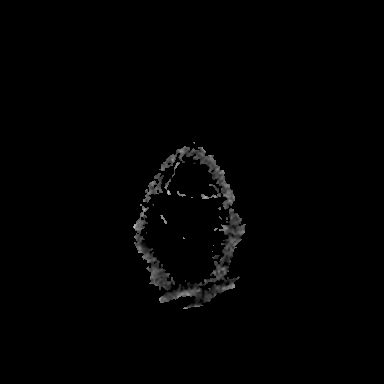

[Series 11: cor dwi_tracew · coronal · 5.0mm · 0.60mm/px · 3 of 36 slices shown]
[im 1/36]
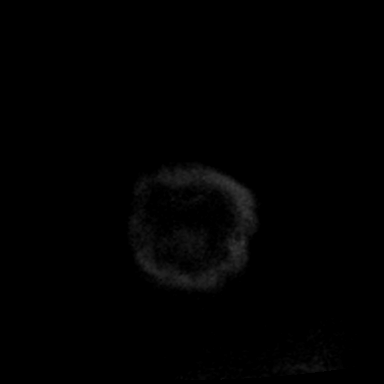
[im 18/36]
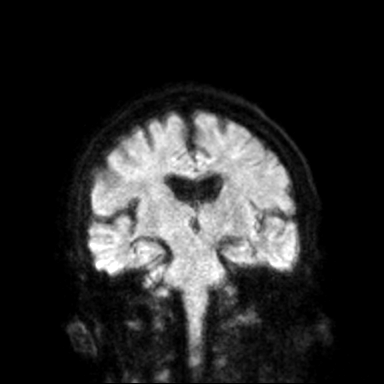
[im 36/36]
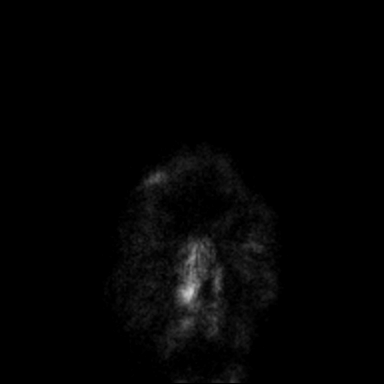

[Series 12: cor dwi_adc · coronal · 5.0mm · 0.60mm/px · 3 of 36 slices shown]
[im 1/36]
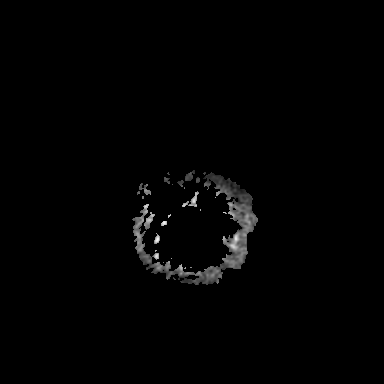
[im 18/36]
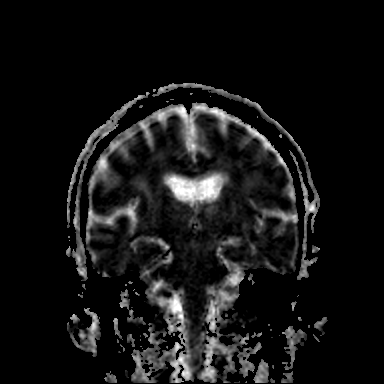
[im 36/36]
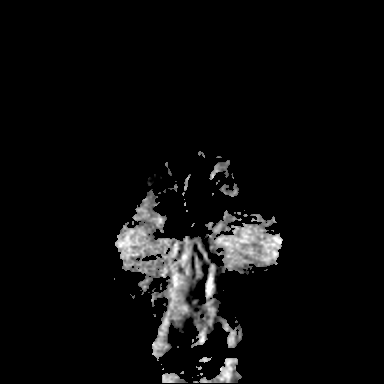

[Series 13: T1 · sagittal · 5.0mm · 0.62mm/px · 2 of 21 slices shown (1 of 2)]
[im 1/21]
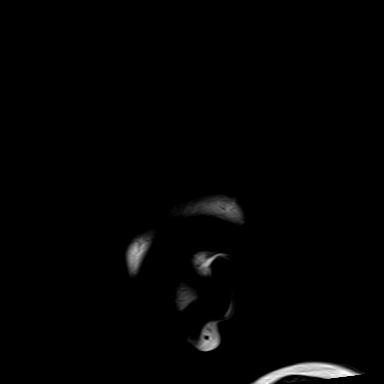
[im 21/21]
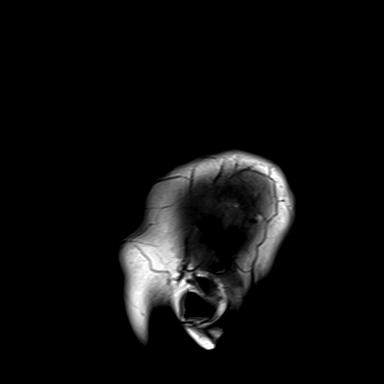

[Series 14: T2 · axial · 5.0mm · 0.53mm/px · z∈[+6,+124]mm · 2 of 25 slices shown (1 of 2)]
[im 1/25]
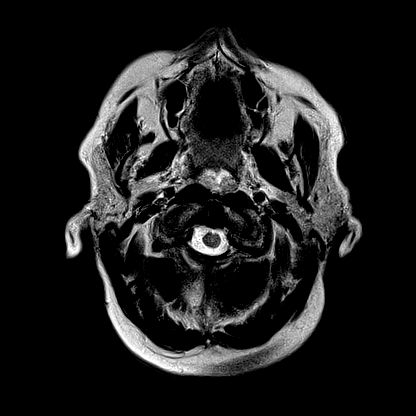
[im 25/25]
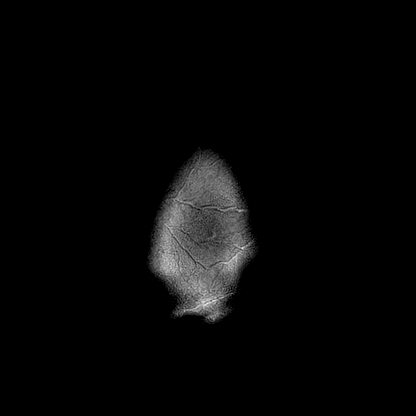

[Series 16: pha_images · axial · 3.0mm · 0.90mm/px · z∈[-3,+63]mm · 3 of 54 slices shown]
[im 1/54]
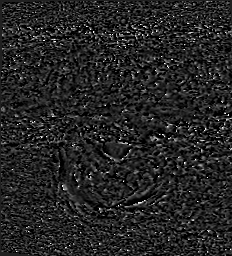
[im 14/54]
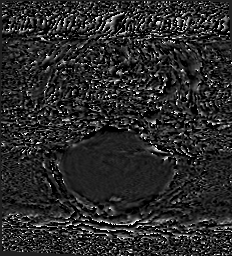
[im 27/54]
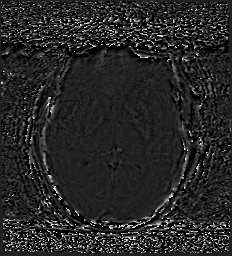

[Series 19: FLAIR · axial · 3.0mm · 0.53mm/px · z∈[-2,+131]mm · 5 of 55 slices shown]
[im 1/55]
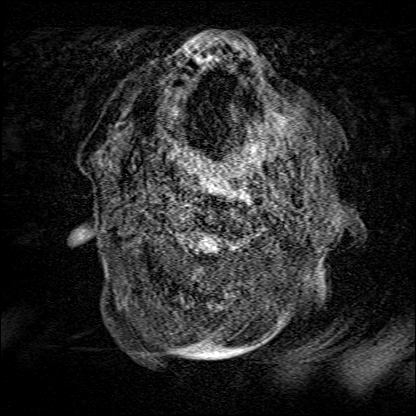
[im 14/55]
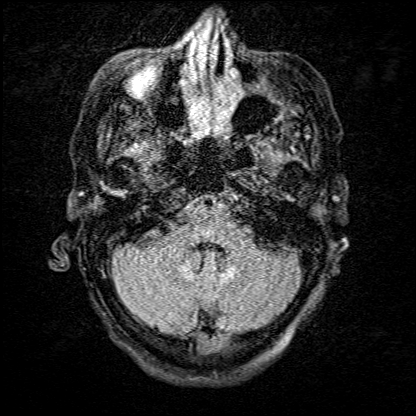
[im 28/55]
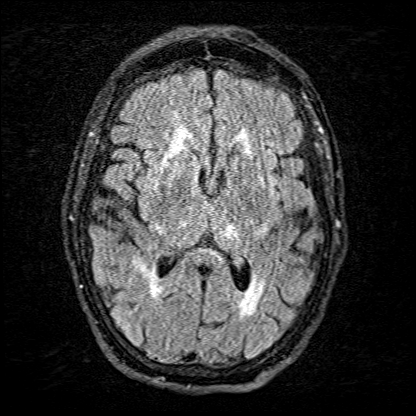
[im 41/55]
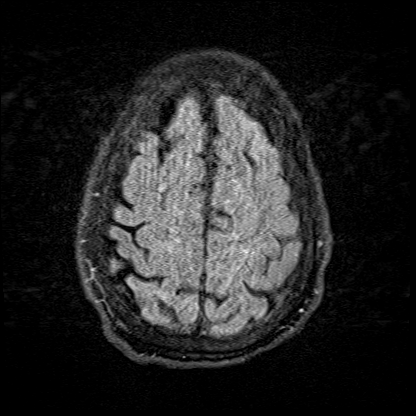
[im 55/55]
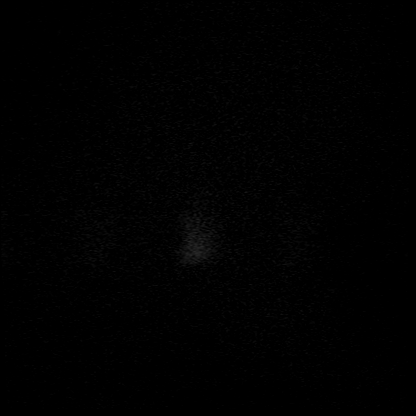

[Series 20: T1 · axial · 1.0mm · 0.98mm/px · z∈[+7,+138]mm · 8 of 160 slices shown (2 of 2)]
[im 1/160]
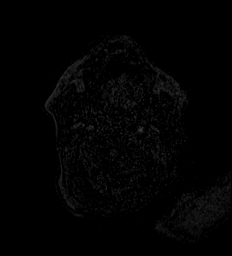
[im 27/160]
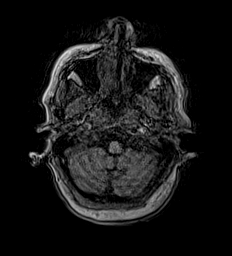
[im 54/160]
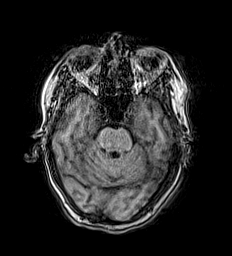
[im 67/160]
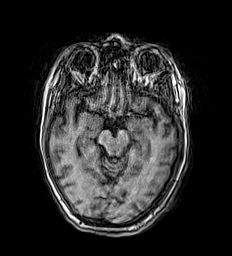
[im 93/160]
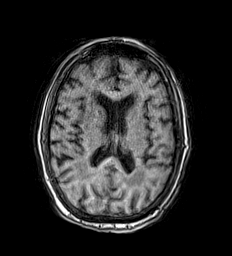
[im 107/160]
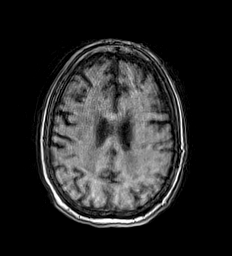
[im 133/160]
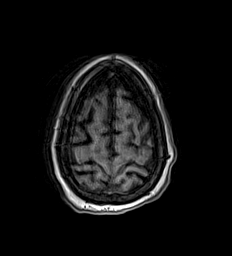
[im 160/160]
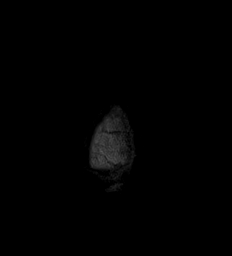

[Series 21: T2 · coronal · 5.0mm · 0.45mm/px · 3 of 31 slices shown (2 of 2)]
[im 1/31]
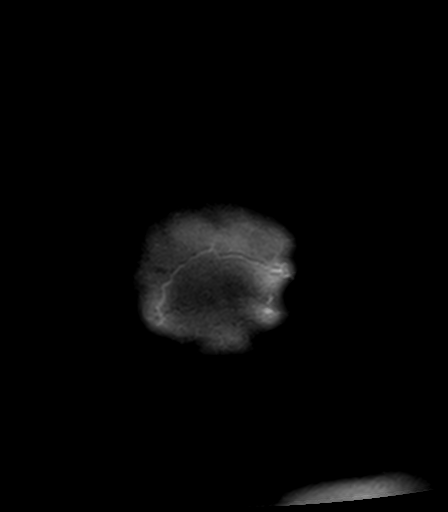
[im 16/31]
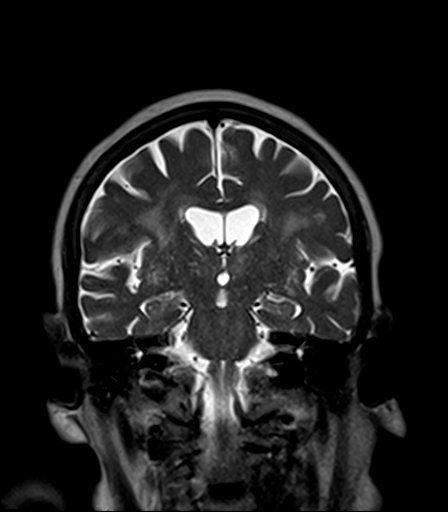
[im 31/31]
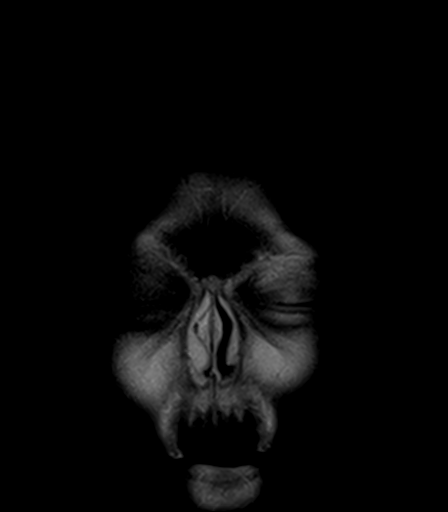

[36 of 48 positions shown; findings below may reference images not displayed]

FINDINGS: Brain:

There is moderate motion degradation of the coronal
diffusion-weighted sequence. However, the axial diffusion-weighted
sequence is of good quality and there is no evidence of acute
infarct.

Multiple additional sequences are significantly motion degraded.
Most notably there is moderate motion degradation of the axial SWI
sequence, moderate/severe motion degradation of the axial T2 FLAIR
sequence, and moderate/severe motion degradation of the axial T1
weighted sequence.

Within described limitations, no intracranial mass is identified. No
extra-axial fluid collection is identified. No midline shift. Severe
patchy and confluent T2/FLAIR hyperintensity within the cerebral
white matter is nonspecific, but likely reflects chronic small
vessel ischemic disease. Chronic ischemic changes are also present
within the bilateral basal ganglia and thalami as well as pons. No
definite chronic intracranial blood products. Mild generalized
parenchymal atrophy.

Vascular: Flow voids maintained within the proximal large arterial
vessels.

Skull and upper cervical spine: No focal marrow lesion

Sinuses/Orbits: Visualized orbits demonstrate no acute abnormality.
Mild paranasal sinus mucosal thickening greatest within bilateral
ethmoid air cells. Small right maxillary sinus air-fluid level.
Trace fluid within bilateral mastoid air cells. Secretions within
the posterior nasopharynx.
IMPRESSION: The axial diffusion-weighted sequence is of good quality. No
evidence of acute infarct.

Multiple sequences are significantly motion degraded. Within this
limitation, no evidence of acute intracranial abnormality.

Advanced chronic small vessel ischemic disease, progressed from MRI
07/20/2014.

Mild generalized parenchymal atrophy.

Mild paranasal sinus mucosal thickening with right maxillary sinus
air-fluid level.

## 2021-04-13 IMAGING — CT CT HEAD W/O CM
3 of 6 series · 14 of 47 positions shown, 16 images · non-contrast
Comparison: Head CT yesterday at 8684 hour

CLINICAL DATA: Subacute neuro deficit. Weakness, onset today.

EXAM:
CT HEAD WITHOUT CONTRAST
TECHNIQUE: Contiguous axial images were obtained from the base of the skull
through the vertex without intravenous contrast.

[Series 3: head wo · axial · 0.41mm/px · z∈[-147,-37]mm · 8 of 30 slices shown, 10 images]
[im 4/30  brain]
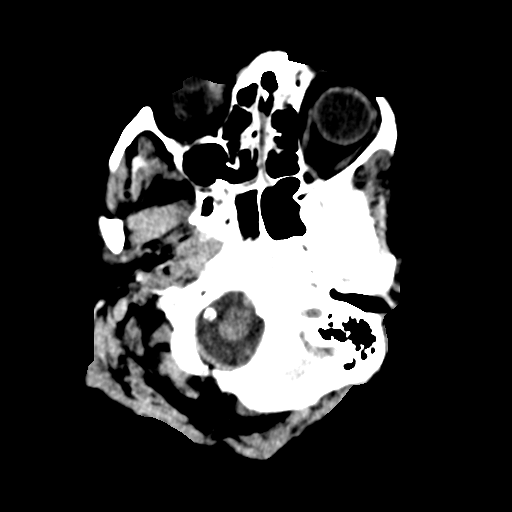
[im 4/30  bone]
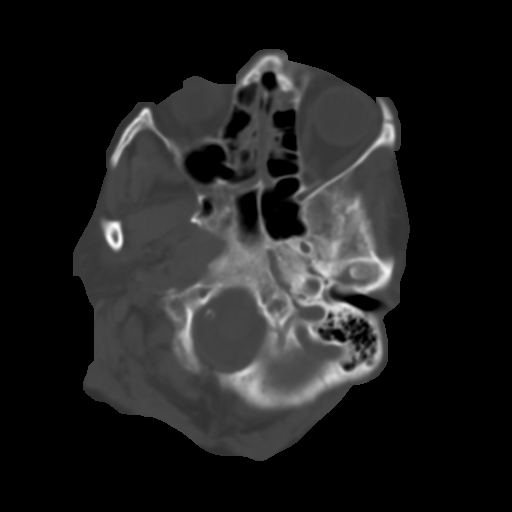
[im 7/30  brain]
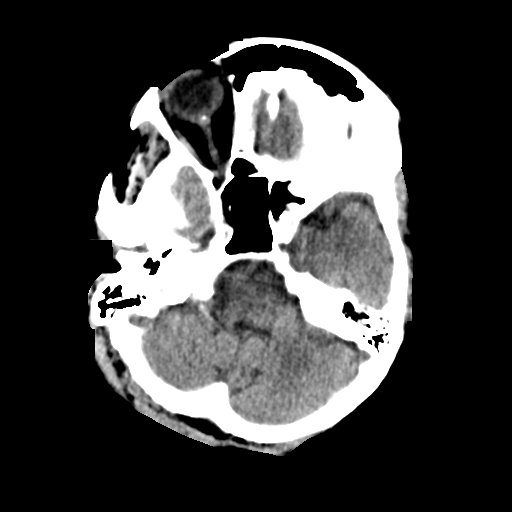
[im 10/30  brain]
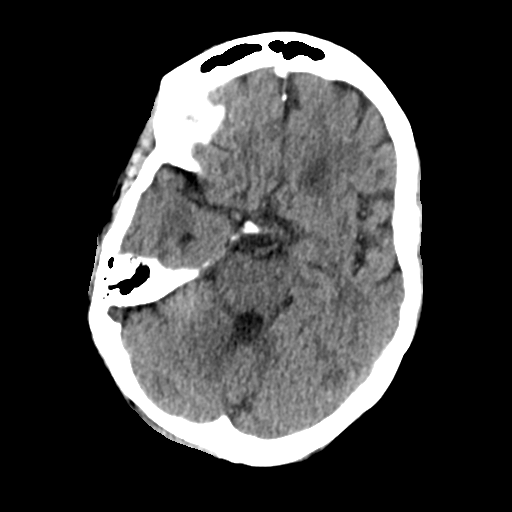
[im 13/30  brain]
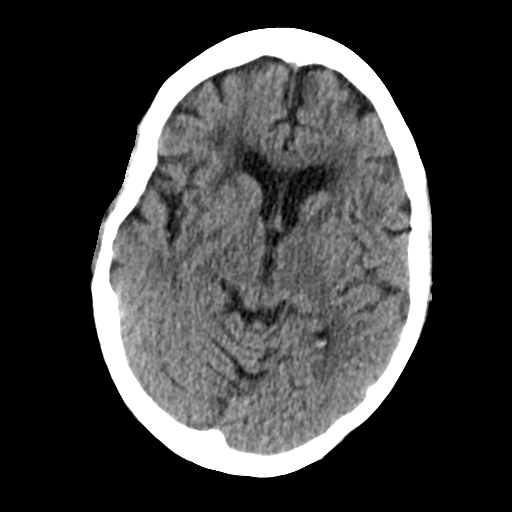
[im 17/30  brain]
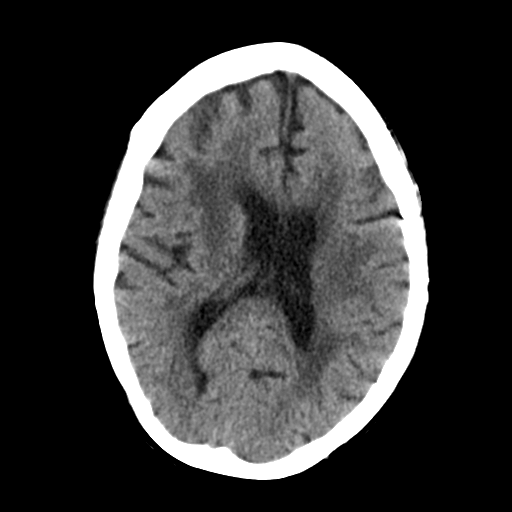
[im 17/30  bone]
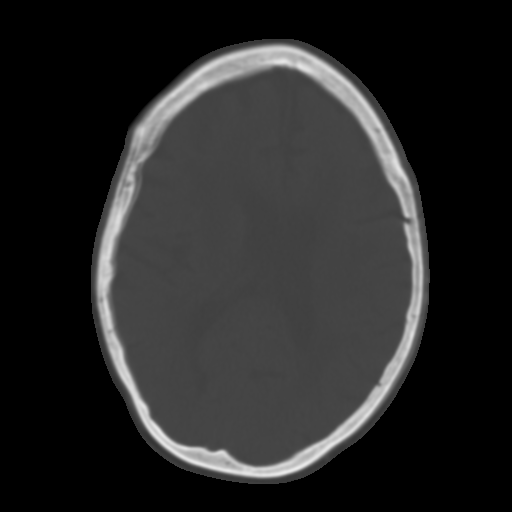
[im 20/30  brain]
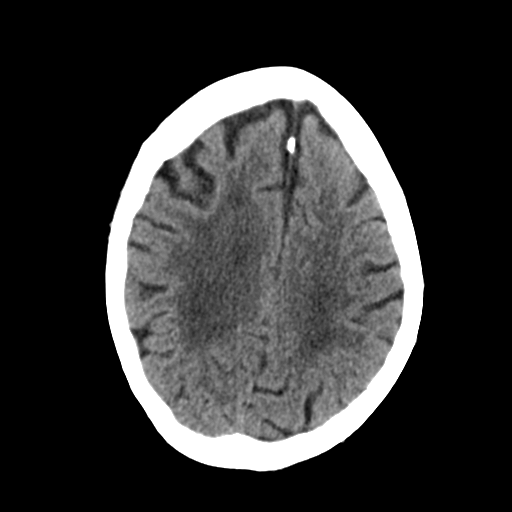
[im 23/30  brain]
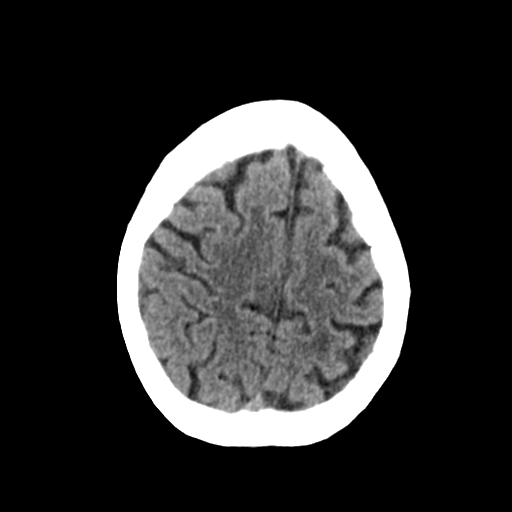
[im 26/30  brain]
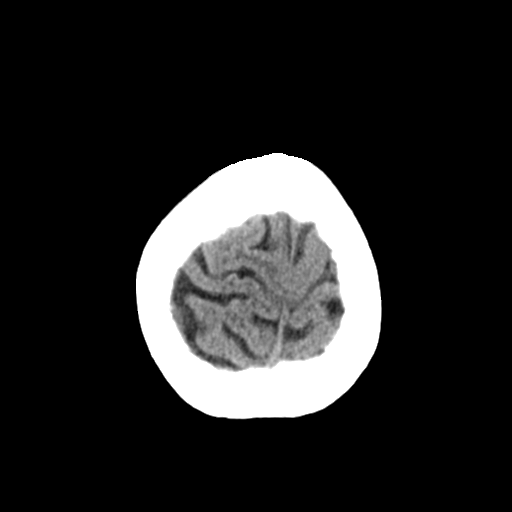

[Series 6: coronal soft tissue · coronal · 0.31mm/px · 3 of 66 slices shown]
[im 17/66  brain]
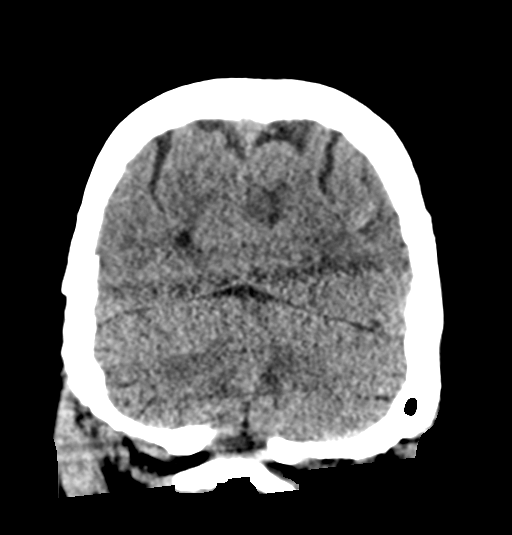
[im 33/66  brain]
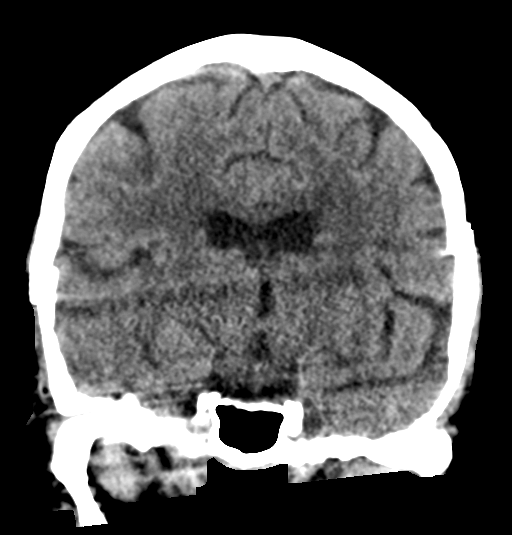
[im 49/66  brain]
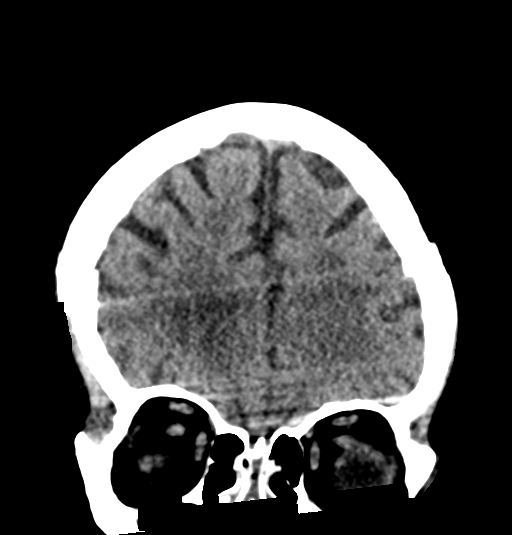

[Series 9: sagittal soft tissue · sagittal · 0.22mm/px · 3 of 51 slices shown]
[im 23/51  brain]
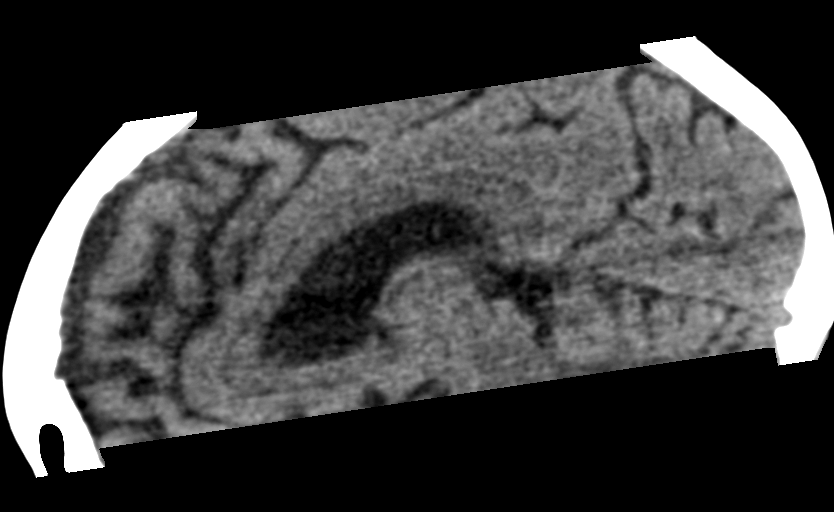
[im 32/51  brain]
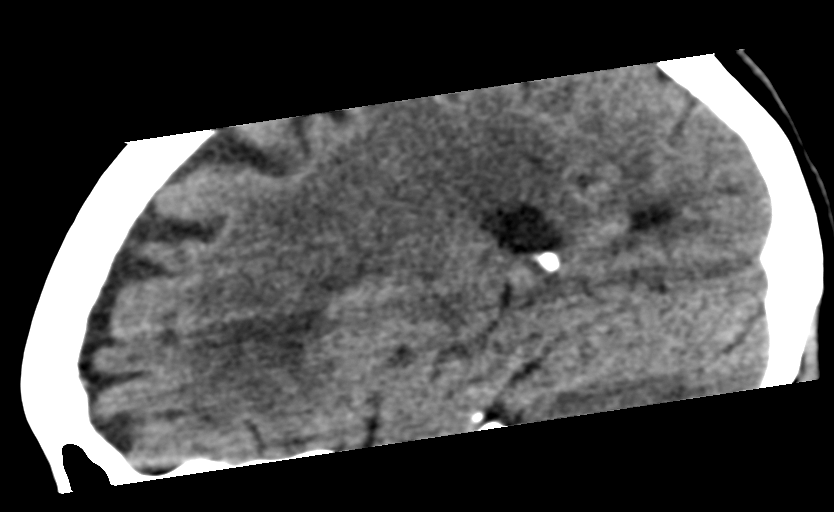
[im 41/51  brain]
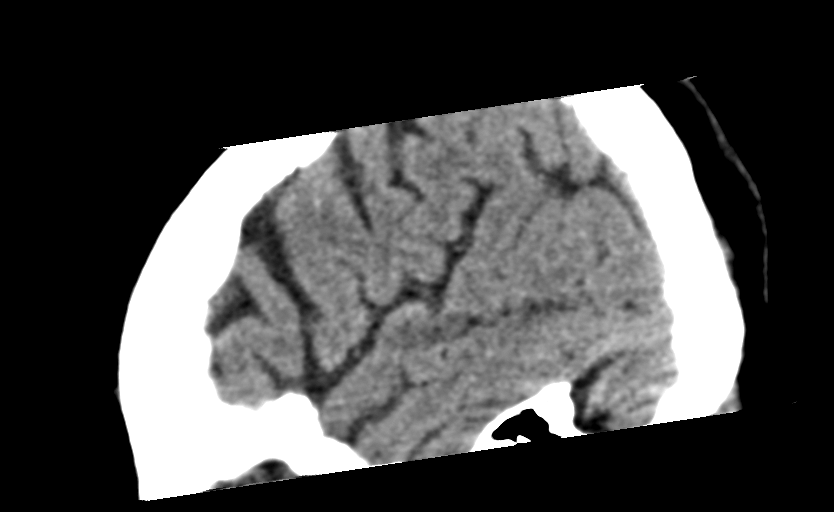

[14 of 47 positions shown; findings below may reference images not displayed]

FINDINGS: Brain: No evidence of acute infarction, hemorrhage, hydrocephalus,
extra-axial collection or mass lesion/mass effect. Stable degree of
atrophy and chronic small vessel ischemia from yesterday.

Vascular: Atherosclerosis of skullbase vasculature without
hyperdense vessel or abnormal calcification.

Skull: No fracture or focal lesion.

Sinuses/Orbits: Minor mucosal thickening of the ethmoid air cells
and right maxillary sinus. The mastoid air cells are clear. No acute
orbital abnormality. Bilateral cataract resection.

Other: None.
IMPRESSION: 1. No change from CT yesterday. No hemorrhage or evidence of all
vein ischemia. Consider further evaluation with MRI based on
clinical concern.
2. Atrophy and chronic small vessel ischemia.

## 2021-04-14 IMAGING — DX DG CHEST 1V PORT
1 series · 1 of 1 positions shown · non-contrast
Comparison: 03/20/2019

CLINICAL DATA: Acute respiratory failure

EXAM:
PORTABLE CHEST 1 VIEW

[chest ap]
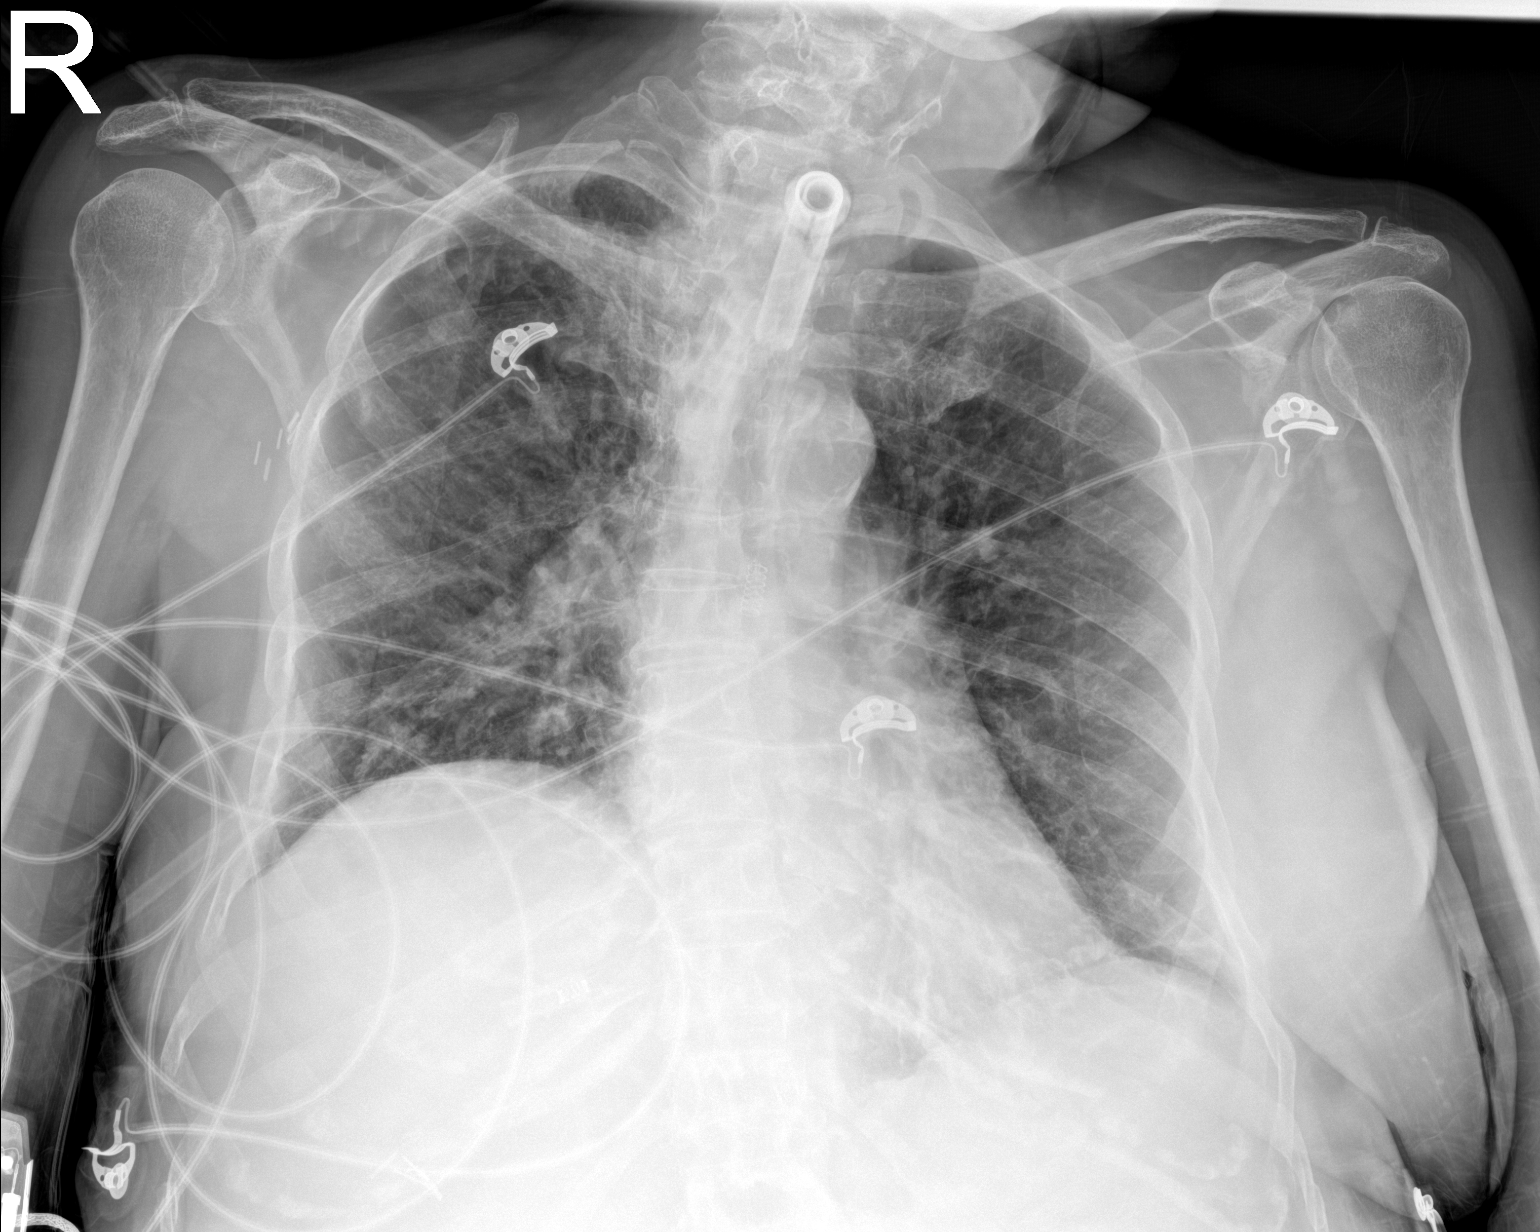

[1 of 1 positions shown; findings below may reference images not displayed]

FINDINGS: Tracheostomy remains in good position. Elevated right hemidiaphragm
unchanged.

No significant infiltrate or effusion. No change from the prior
study.
IMPRESSION: Tracheostomy remains in good position. Elevated right hemidiaphragm.
No focal infiltrate.

## 2021-04-16 IMAGING — DX DG CHEST 1V PORT
1 series · 1 of 1 positions shown · non-contrast
Comparison: Radiograph 03/22/2019, CT 03/20/2019

CLINICAL DATA: Acute respiratory failure

EXAM:
PORTABLE CHEST 1 VIEW

[chest ap]
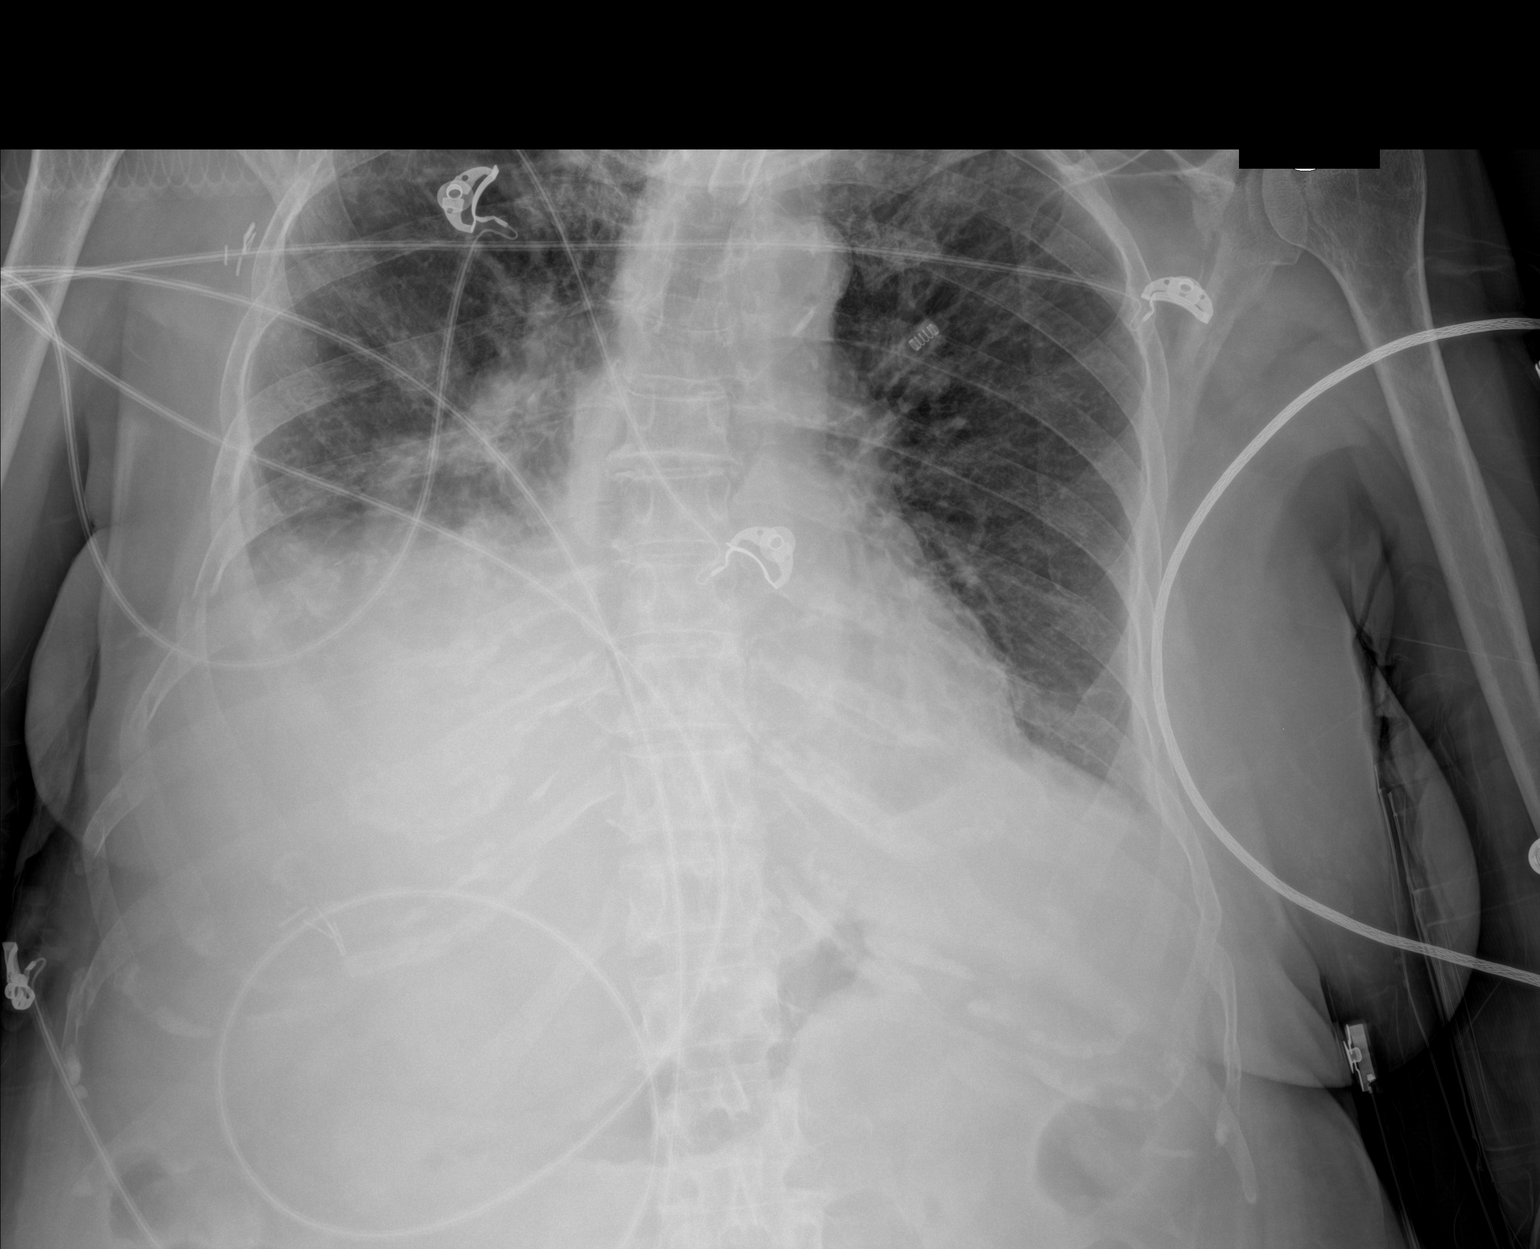

[1 of 1 positions shown; findings below may reference images not displayed]

FINDINGS: Tracheostomy tube terminates in the mid trachea some increasing
opacity in the right lung base with obscuration of the right
hemidiaphragm and basilar pleural thickening is compatible with
pleural effusion. Some trace effusion is likely present in the left
lung base as well. No visible pneumothorax. Worsening hazy
interstitial opacity is present suggesting some developing pulmonary
edema. The aorta is calcified. The remaining cardiomediastinal
contours are unremarkable. Multiple contiguous right-sided rib
fractures are again noted postsurgical changes are seen in the right
axilla and right upper quadrant.
IMPRESSION: 1. Moderate right pleural effusion and probable trace left pleural
effusion.
2. Worsening hazy interstitial opacity suggesting developing
pulmonary edema and/or worsening atelectasis
3. Multiple contiguous right rib fractures, similar to prior.

## 2021-04-18 IMAGING — CT CT CHEST W/O CM
2 of 3 series · 15 of 36 positions shown, 18 images · non-contrast
Comparison: Chest radiograph of 03/24/2019. Most recent CT a screws
at most recent CT of 03/20/2019.

CLINICAL DATA: Pneumonia.  Effusion or abscess suspected.

EXAM:
CT CHEST WITHOUT CONTRAST
TECHNIQUE: Multidetector CT imaging of the chest was performed following the
standard protocol without IV contrast.

[Series 2: thorax · axial · 0.69mm/px · z∈[-417,-201]mm · 12 of 128 slices shown, 15 images]
[im 10/128  mediastinal]
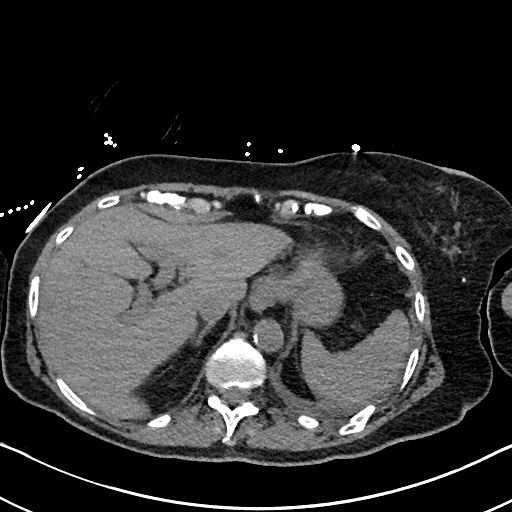
[im 10/128  lung]
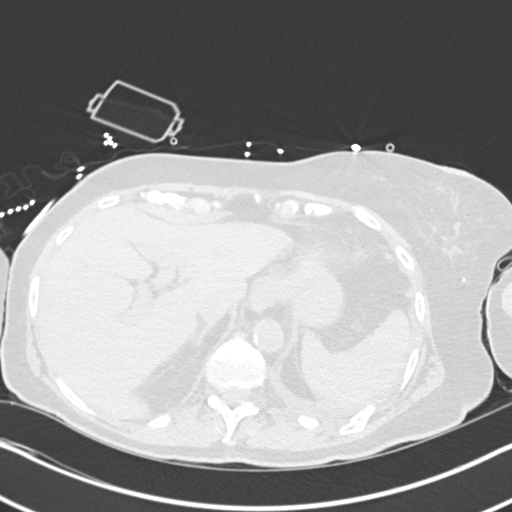
[im 19/128  lung]
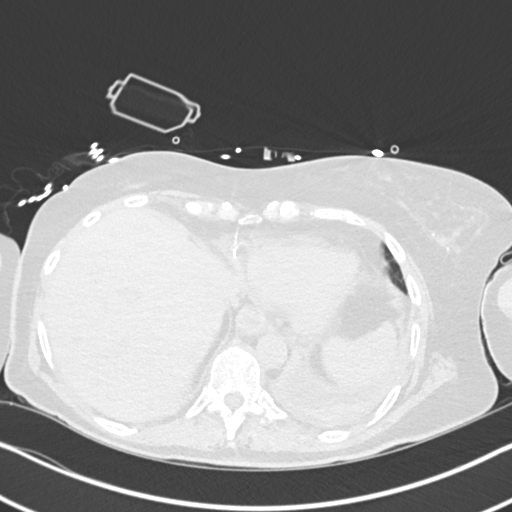
[im 29/128  lung]
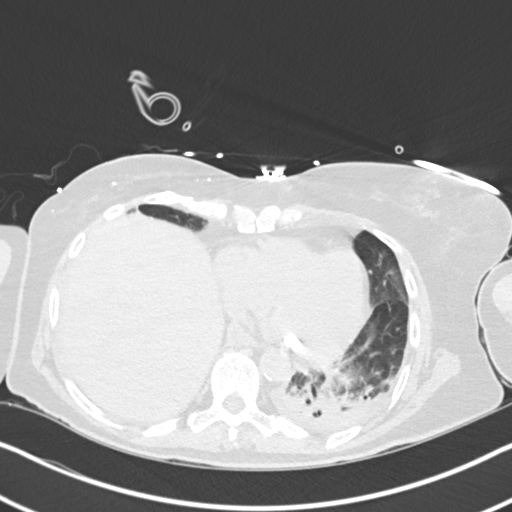
[im 38/128  lung]
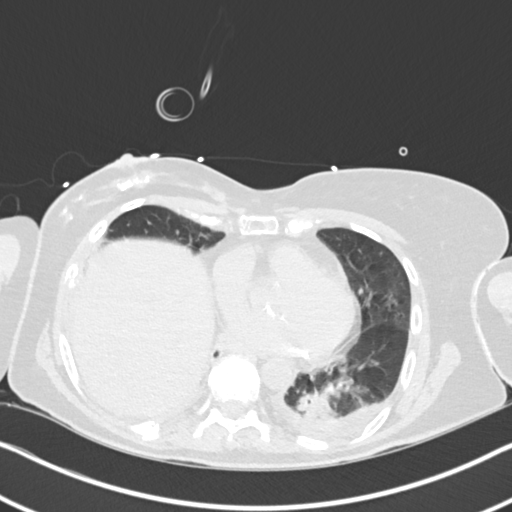
[im 48/128  mediastinal]
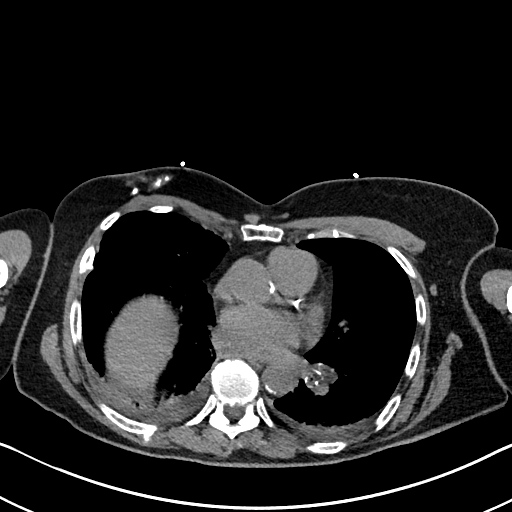
[im 48/128  lung]
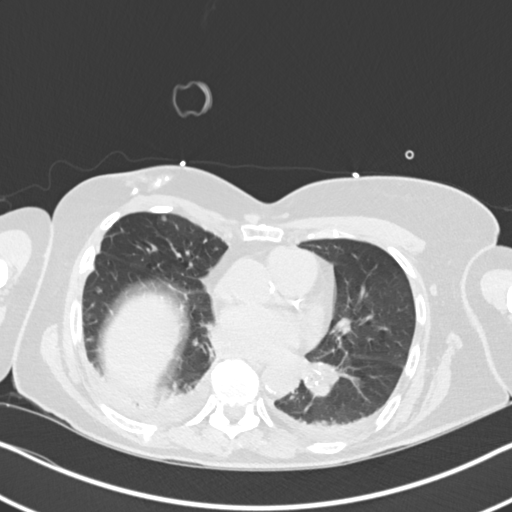
[im 57/128  lung]
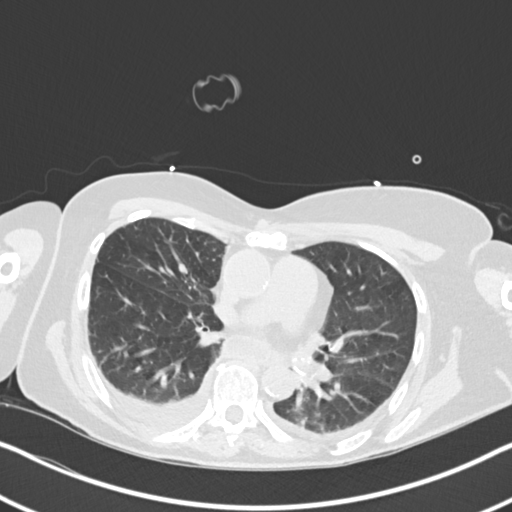
[im 71/128  lung]
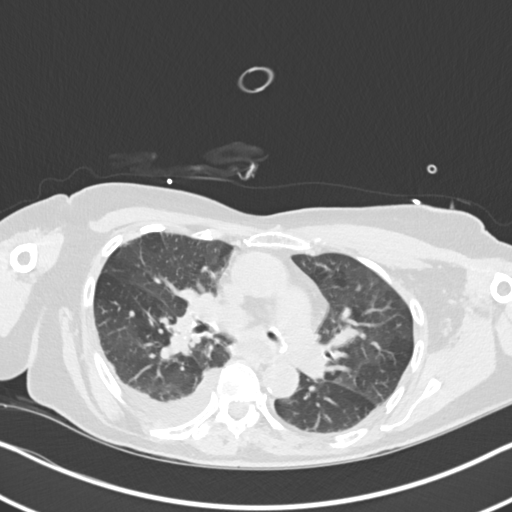
[im 80/128  lung]
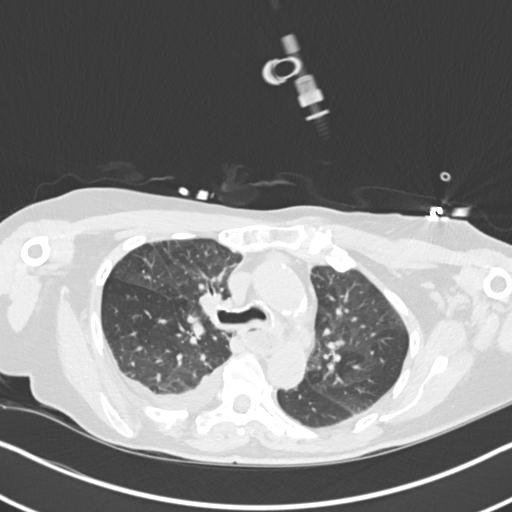
[im 90/128  mediastinal]
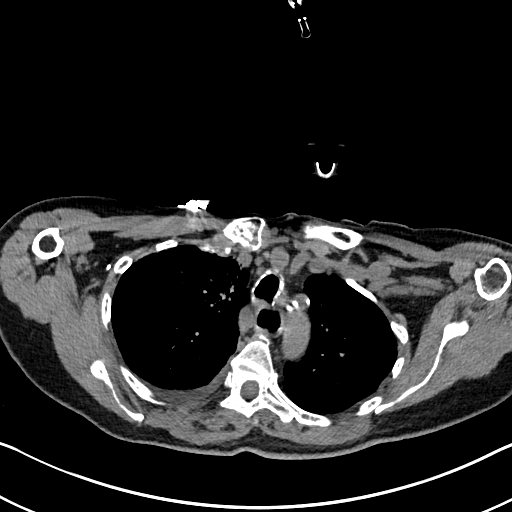
[im 90/128  lung]
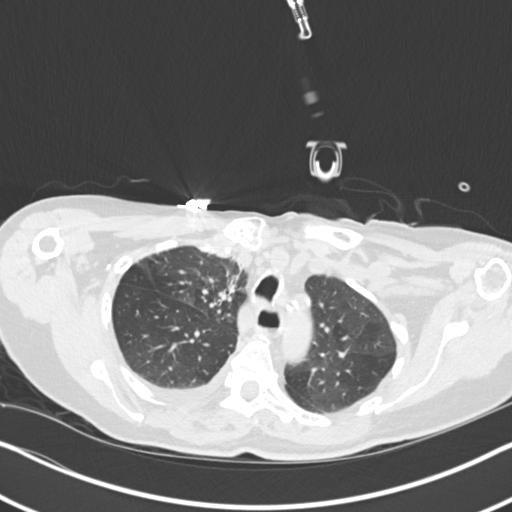
[im 99/128  lung]
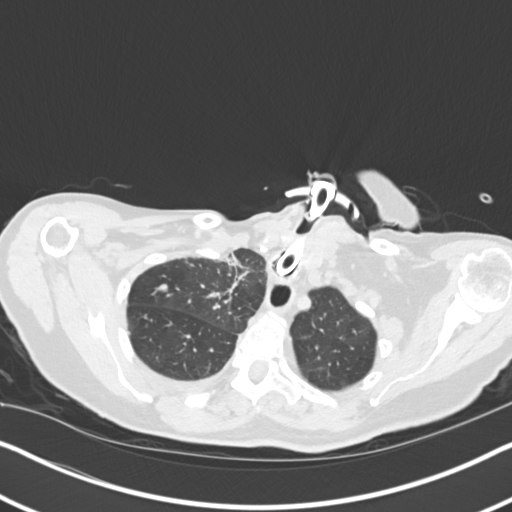
[im 109/128  lung]
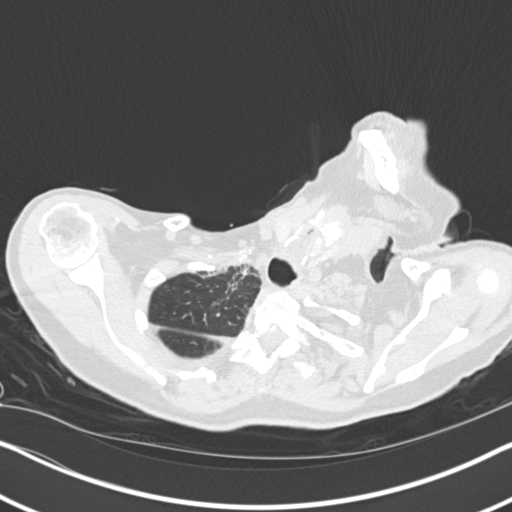
[im 118/128  lung]
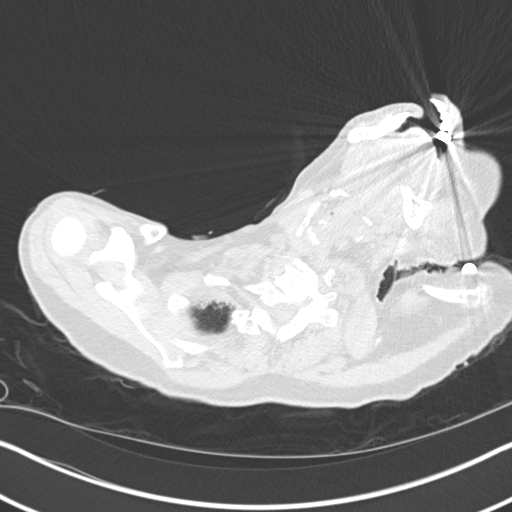

[Series 5: coronal · coronal · 0.57mm/px · 3 of 110 slices shown]
[im 22/110  lung]
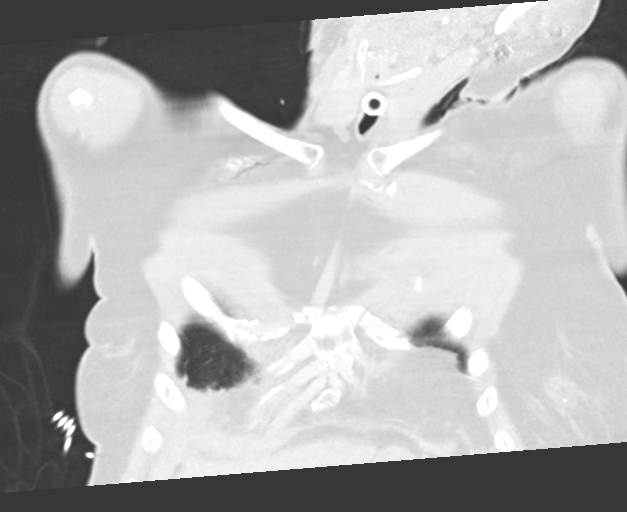
[im 44/110  lung]
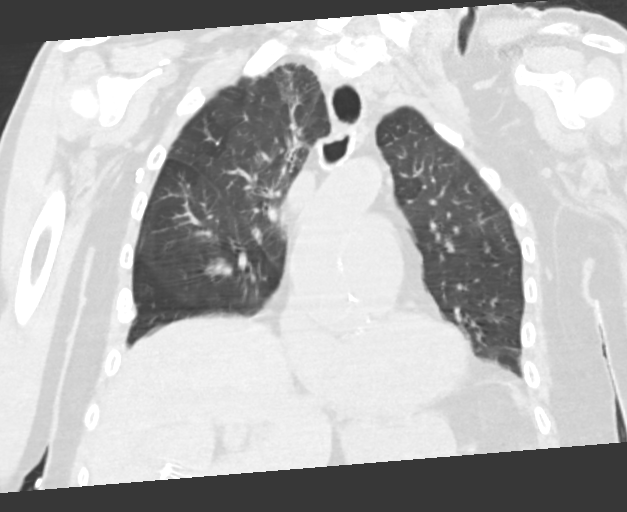
[im 66/110  lung]
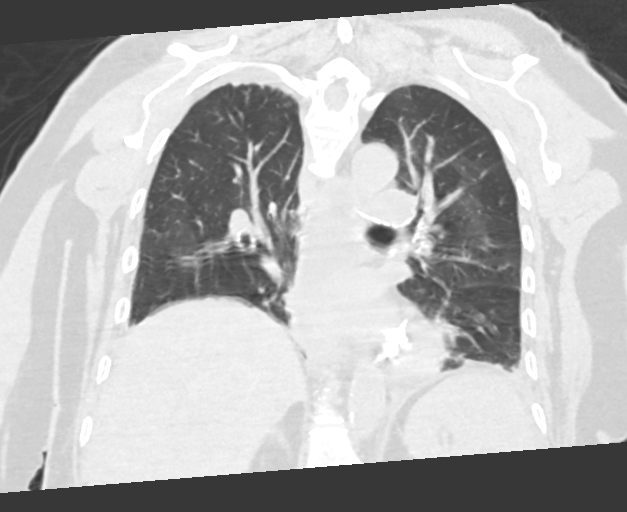

[15 of 36 positions shown; findings below may reference images not displayed]

FINDINGS: Cardiovascular: Mild motion degradation throughout. Aortic and
branch vessel atherosclerosis. Mild cardiomegaly, without
pericardial effusion. Multivessel coronary artery atherosclerosis.
Pulmonary artery enlargement, outflow tract 3.2 cm

Mediastinum/Nodes: No mediastinal or definite hilar adenopathy,
given limitations of unenhanced CT.

The esophagus is dilated with fluid and debris within.

Lungs/Pleura: Small right greater than left pleural effusions,
increased on the right and new on the left since the prior CT.

Tracheostomy is appropriately positioned. New fluid in the left
lower lobe endobronchial tree, including on 80/13.

Right apical pleuroparenchymal scarring. Posterior right upper lobe
6 mm nodule on [DATE], similar. Other smaller pulmonary nodules were
detailed on the prior exam and unchanged

Worsened left lower lobe airspace disease.

Upper Abdomen: Cholecystectomy. Normal imaged portions of the liver,
spleen, stomach, right adrenal gland, right kidney.

Musculoskeletal: Calcifications in the right breast are likely
dystrophic and similar. Remote left rib fractures. Subacute right
rib fractures, including displaced posterior fractures ribs 4
through 9. Accentuation of expected thoracic kyphosis. Convex right
thoracic spine curvature.
IMPRESSION: 1. Motion degraded exam.
2. Worsened left lower lobe aeration, with increased airspace
disease and new fluid/secretions in the endobronchial tree. Findings
most consistent with progressive aspiration.
3. Redemonstration of dilated, fluid and debris-filled esophagus
which could represent dysmotility and/or gastroesophageal reflux.
Achalasia or distal obstruction could again have this appearance.
4. Enlarged right and new small left pleural effusion.
5. Coronary artery atherosclerosis. Aortic Atherosclerosis
([id number]).

## 2021-04-20 IMAGING — CT CT HEAD W/O CM
2 of 3 series · 12 of 30 positions shown, 15 images · non-contrast
Comparison: Head CT and brain MRI 03/21/2019.

CLINICAL DATA: Altered mental status (AMS), unclear cause.
Lethargy.

EXAM:
CT HEAD WITHOUT CONTRAST
TECHNIQUE: Contiguous axial images were obtained from the base of the skull
through the vertex without intravenous contrast.

[Series 9: axial head wo- · axial · 0.30mm/px · z∈[+308,+430]mm · 9 of 36 slices shown, 12 images]
[im 4/36  brain]
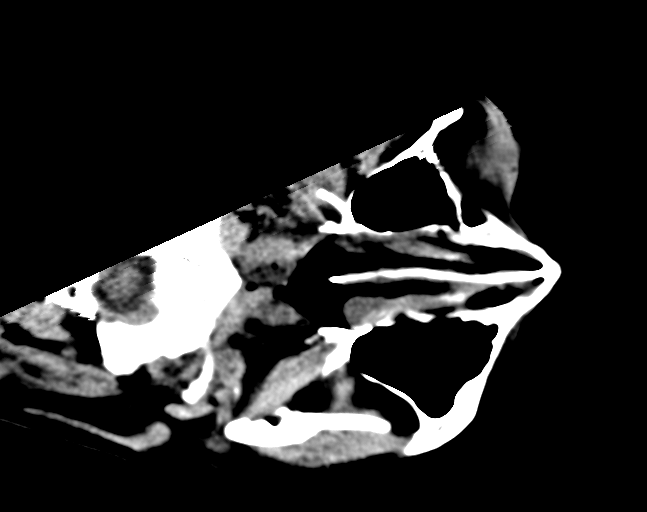
[im 4/36  bone]
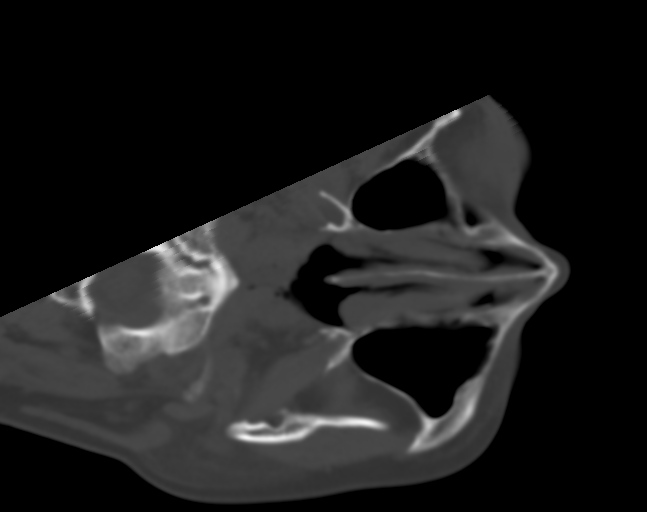
[im 8/36  brain]
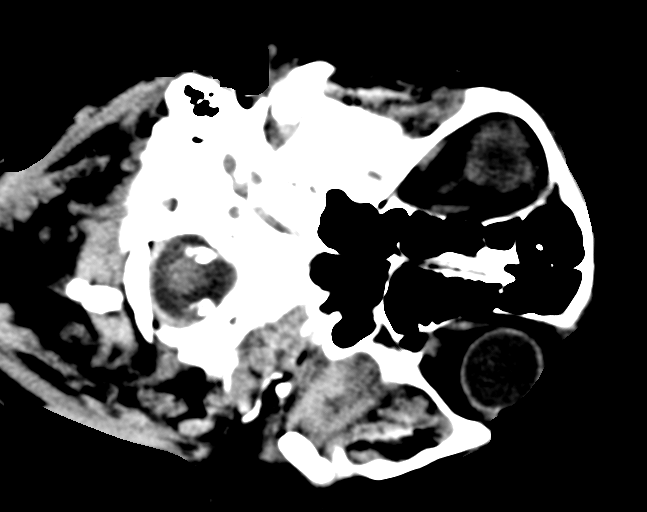
[im 11/36  brain]
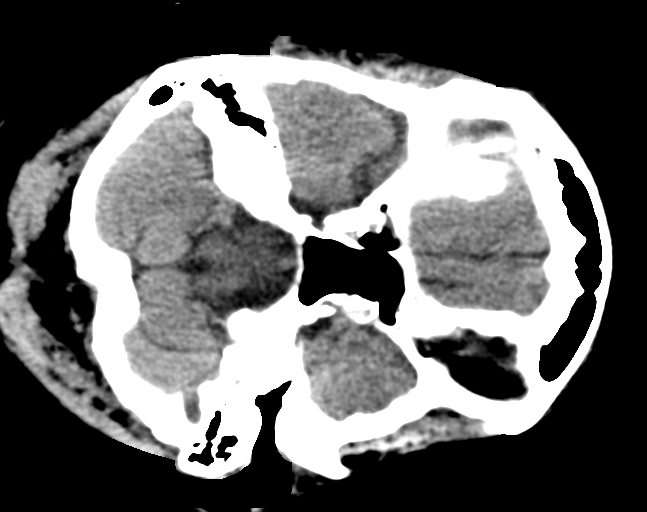
[im 15/36  brain]
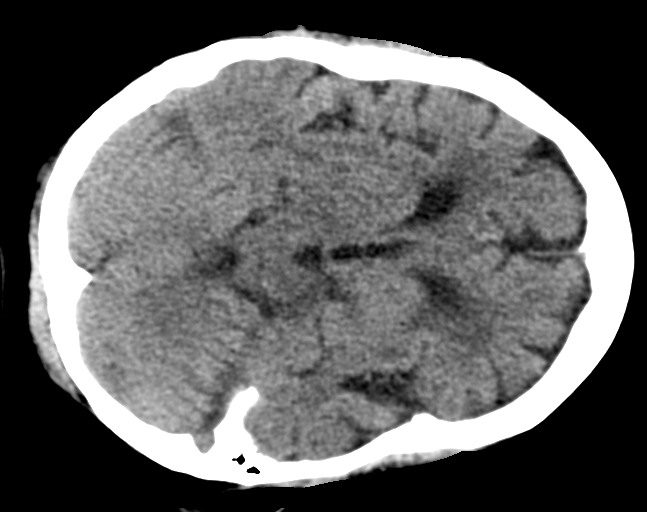
[im 18/36  brain]
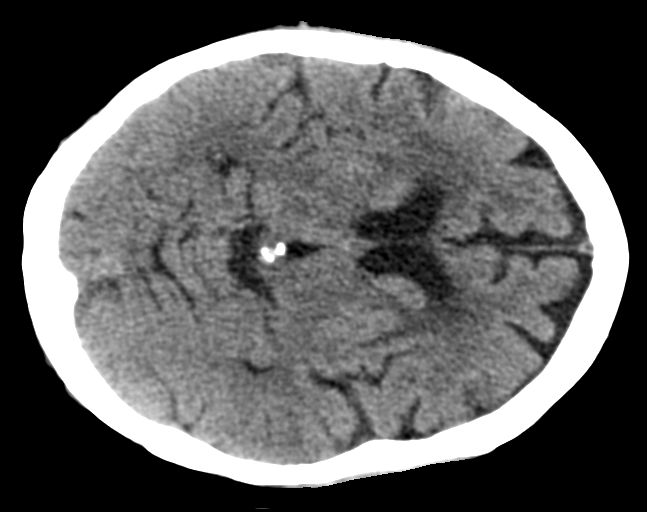
[im 18/36  bone]
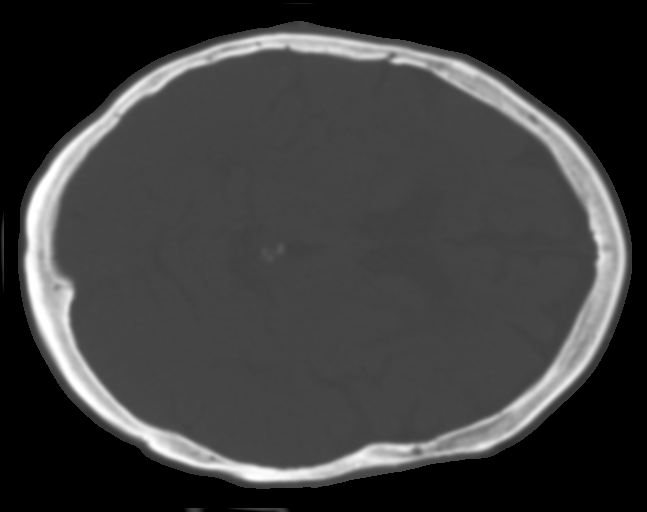
[im 22/36  brain]
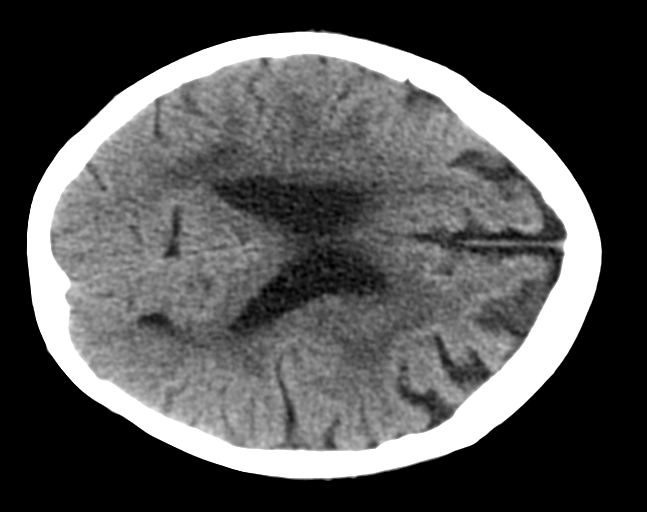
[im 25/36  brain]
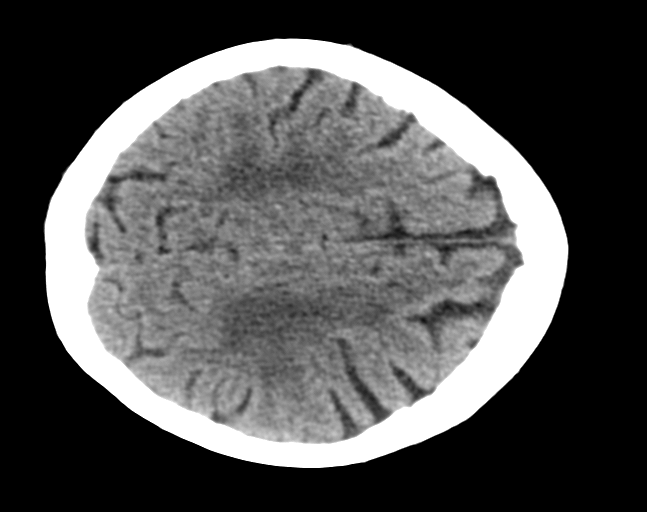
[im 29/36  brain]
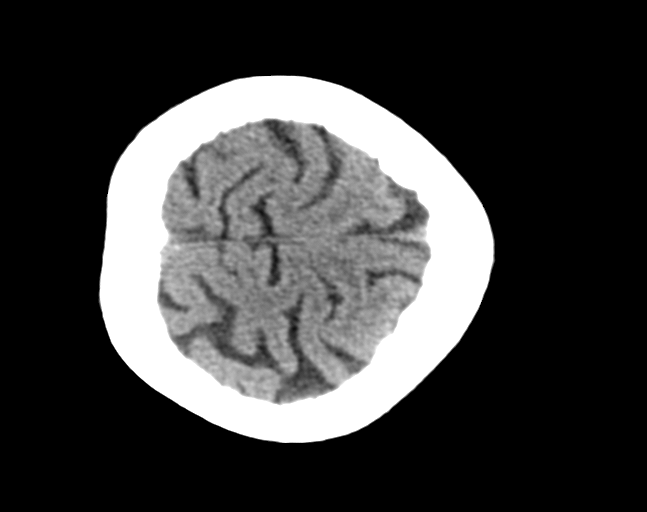
[im 32/36  brain]
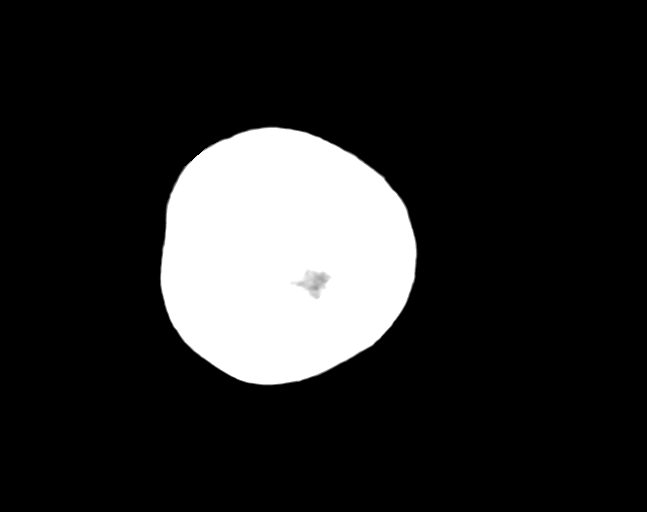
[im 32/36  bone]
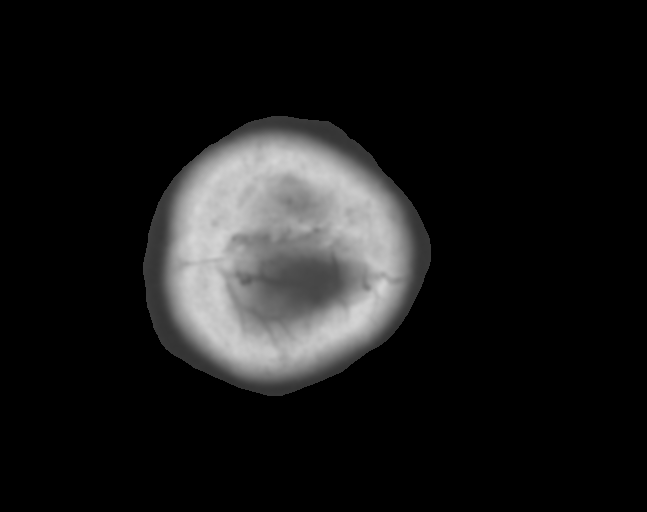

[Series 12: ax head wo · axial · 0.34mm/px · z∈[+366,+398]mm · 3 of 33 slices shown]
[im 4/33  brain]
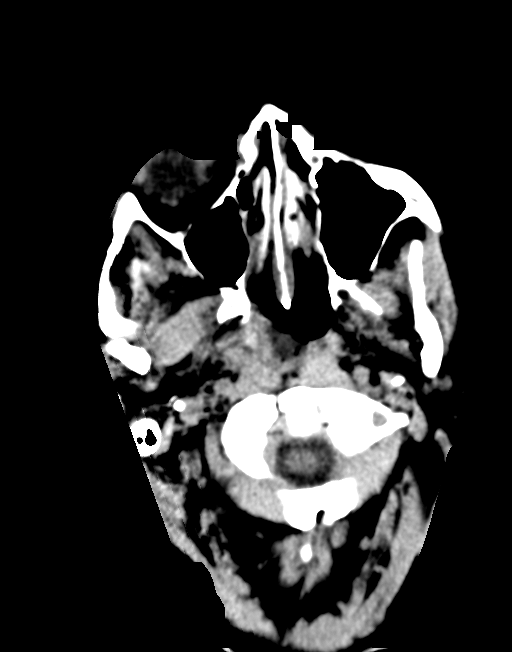
[im 8/33  brain]
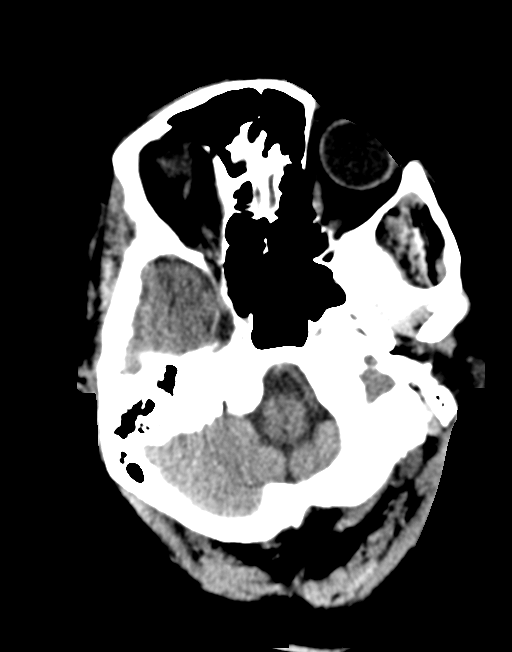
[im 11/33  brain]
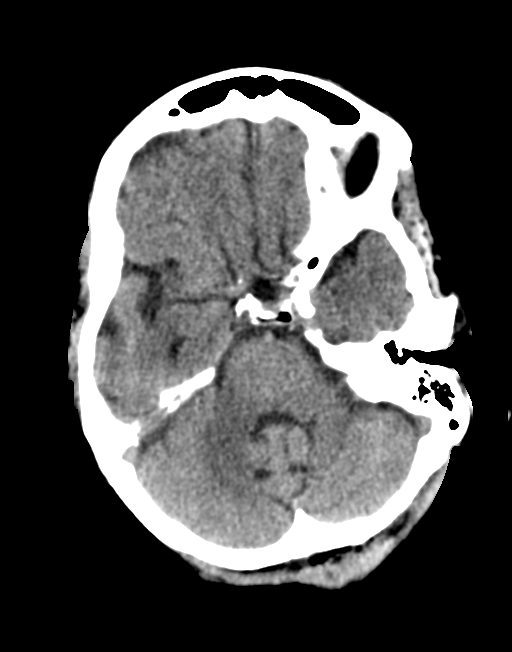

[12 of 30 positions shown; findings below may reference images not displayed]

FINDINGS: Brain: Patient's head was tilted in the scanner. No intracranial
hemorrhage, mass effect, or midline shift. Stable degree of atrophy.
No hydrocephalus. The basilar cisterns are patent. Unchanged chronic
small vessel ischemia without evidence of wall vague infarct. No
extra-axial or intracranial fluid collection.

Vascular: No hyperdense vessel.

Skull: No fracture or focal lesion.

Sinuses/Orbits: Improved paranasal sinus inflammation with resolved
fluid level in the right maxillary sinus. Bilateral cataract
resection. Mastoid air cells are clear.

Other: None.
IMPRESSION: 1. No acute intracranial abnormality.
2. Stable atrophy and chronic small vessel ischemia.
3. Improving paranasal sinus inflammation.

## 2021-04-20 IMAGING — DX DG CHEST 1V PORT
1 series · 1 of 1 positions shown · non-contrast
Comparison: March 24, 2019

CLINICAL DATA: Acute respiratory failure

EXAM:
PORTABLE CHEST 1 VIEW

[chest ap]
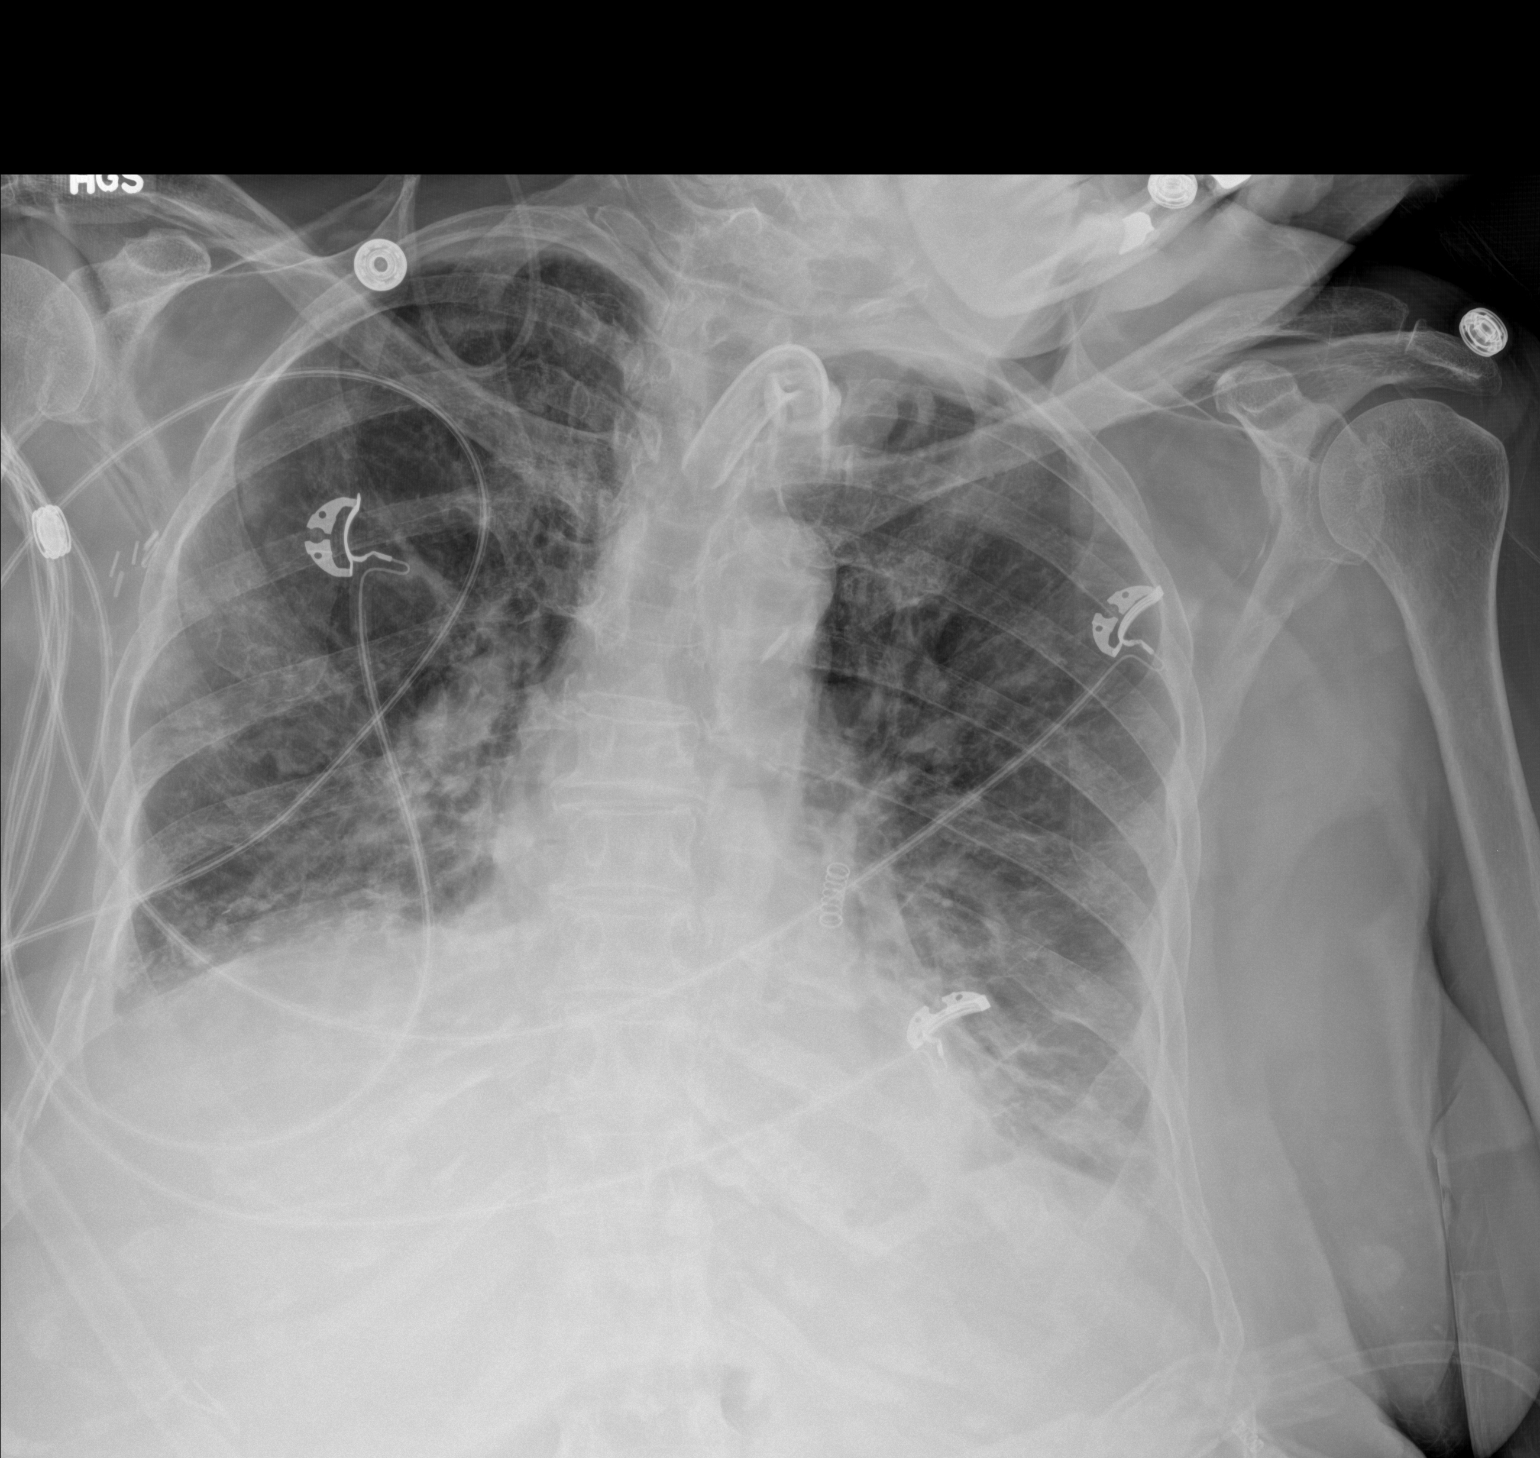

[1 of 1 positions shown; findings below may reference images not displayed]

FINDINGS: Elevation of the right hemidiaphragm remains. The tracheostomy tube
is stable. Bibasilar opacities are identified, worse in the left
base and stable on the right in the interval. No other acute
abnormalities are identified.
IMPRESSION: Worsening opacity in the left base. Stable opacity in the right
base. No other change.

## 2021-05-01 IMAGING — MG DIGITAL SCREENING BILAT W/ TOMO W/ CAD
6 of 10 series · 6 of 30 positions shown · non-contrast
Comparison: Previous exam(s).

CLINICAL DATA: Screening.

EXAM:
DIGITAL SCREENING BILATERAL MAMMOGRAM WITH TOMO AND CAD

[L CC synth-2D (1 of 2)]
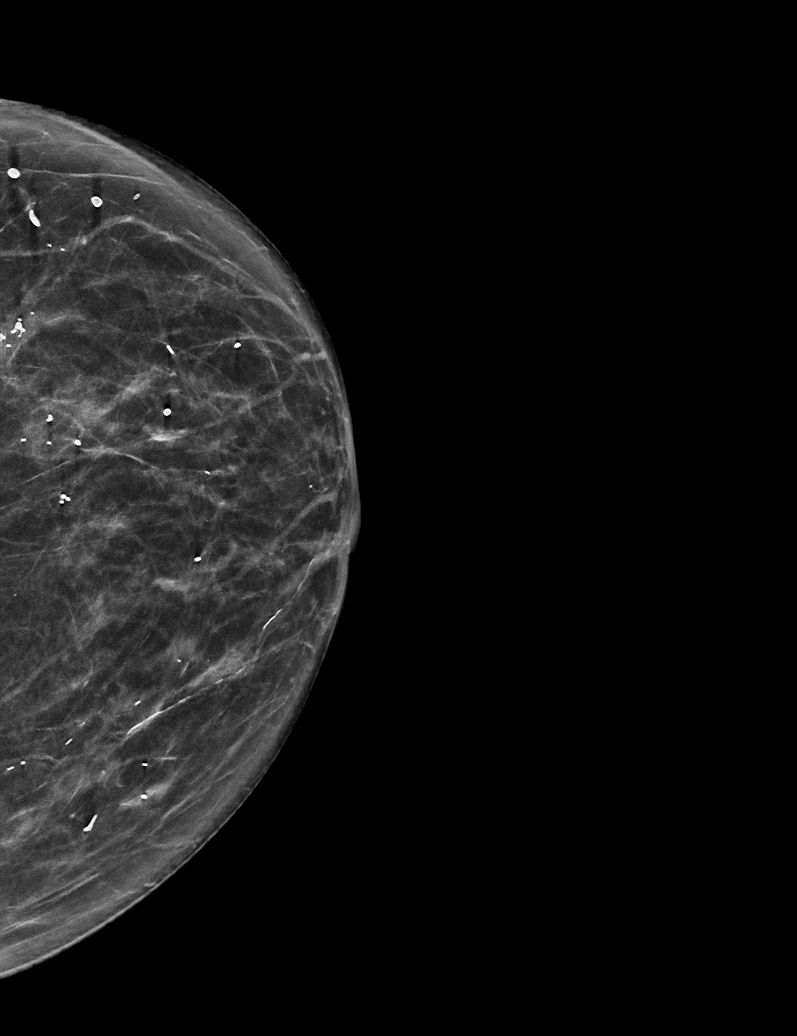

[R CC synth-2D]
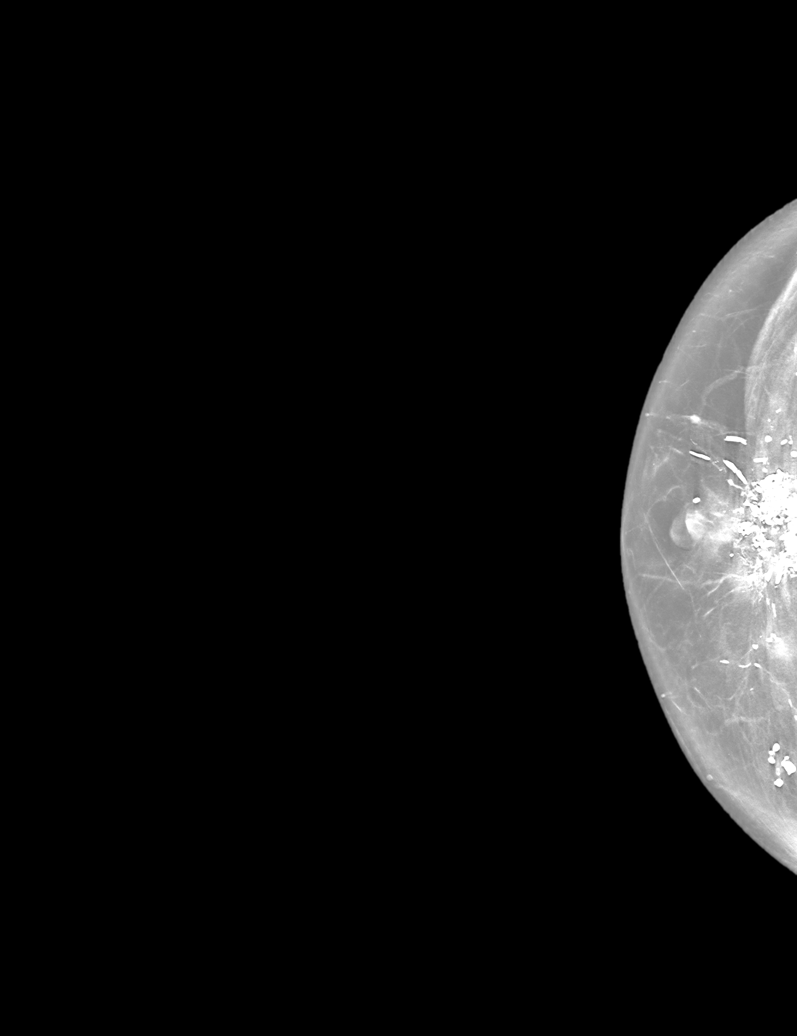

[L MLO synth-2D]
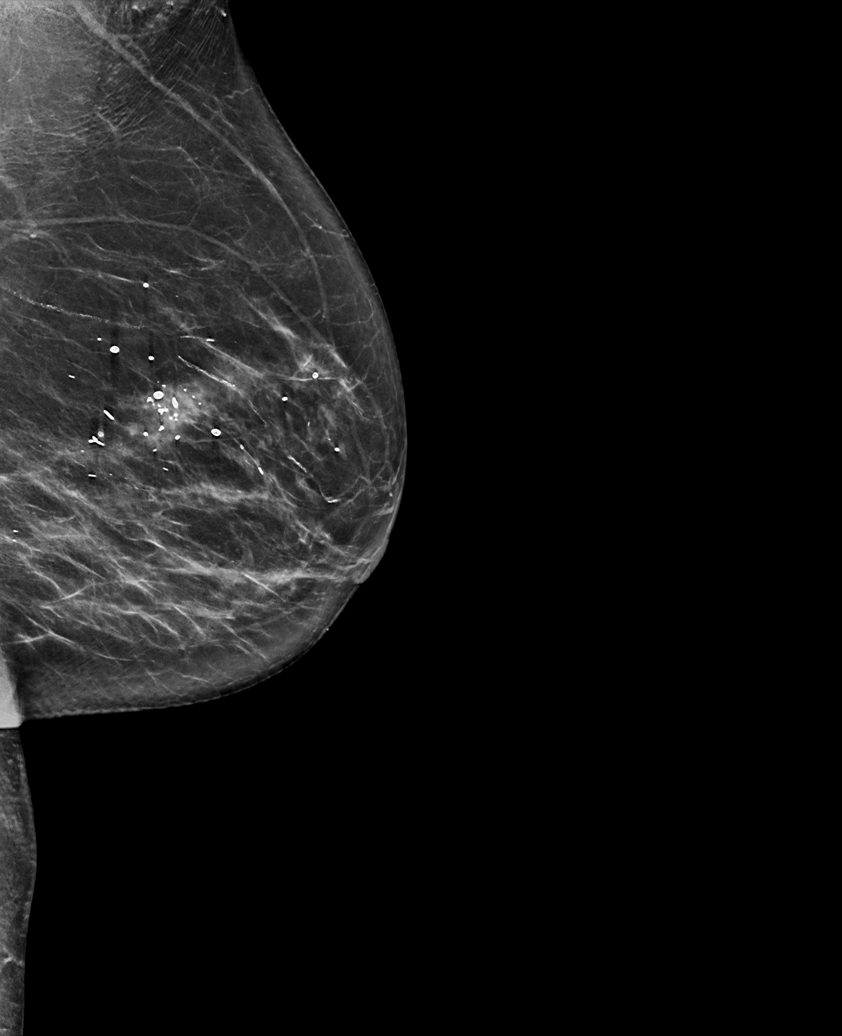

[R MLO synth-2D]
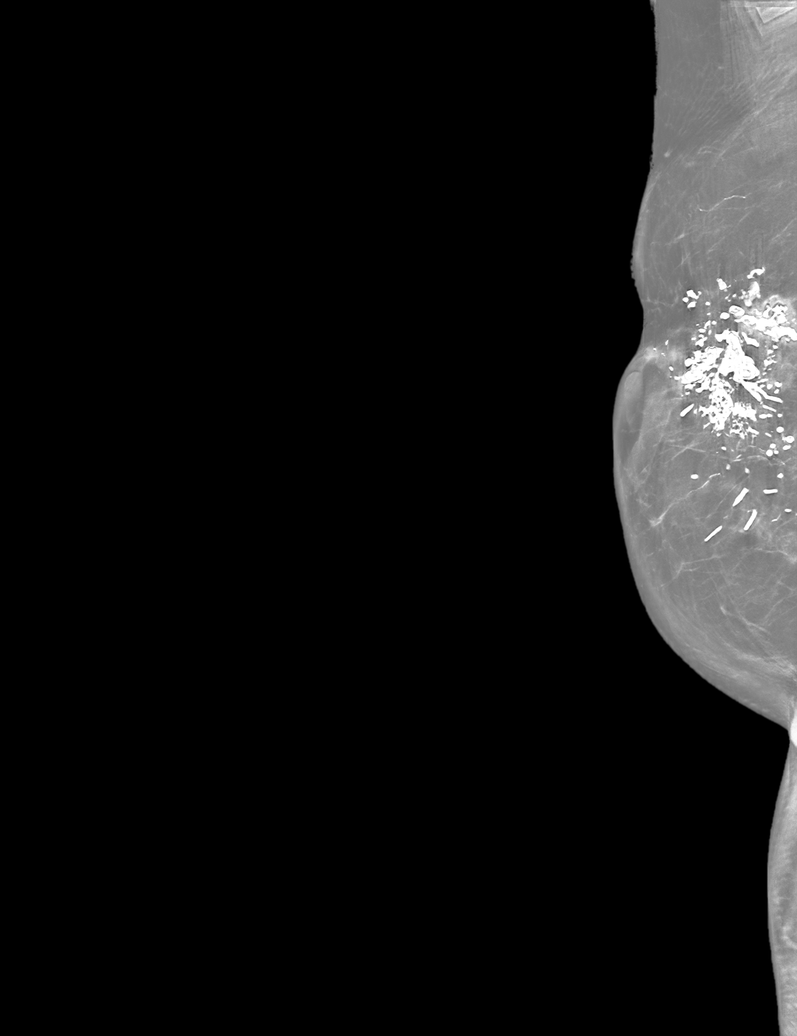

[L CC synth-2D (2 of 2)]
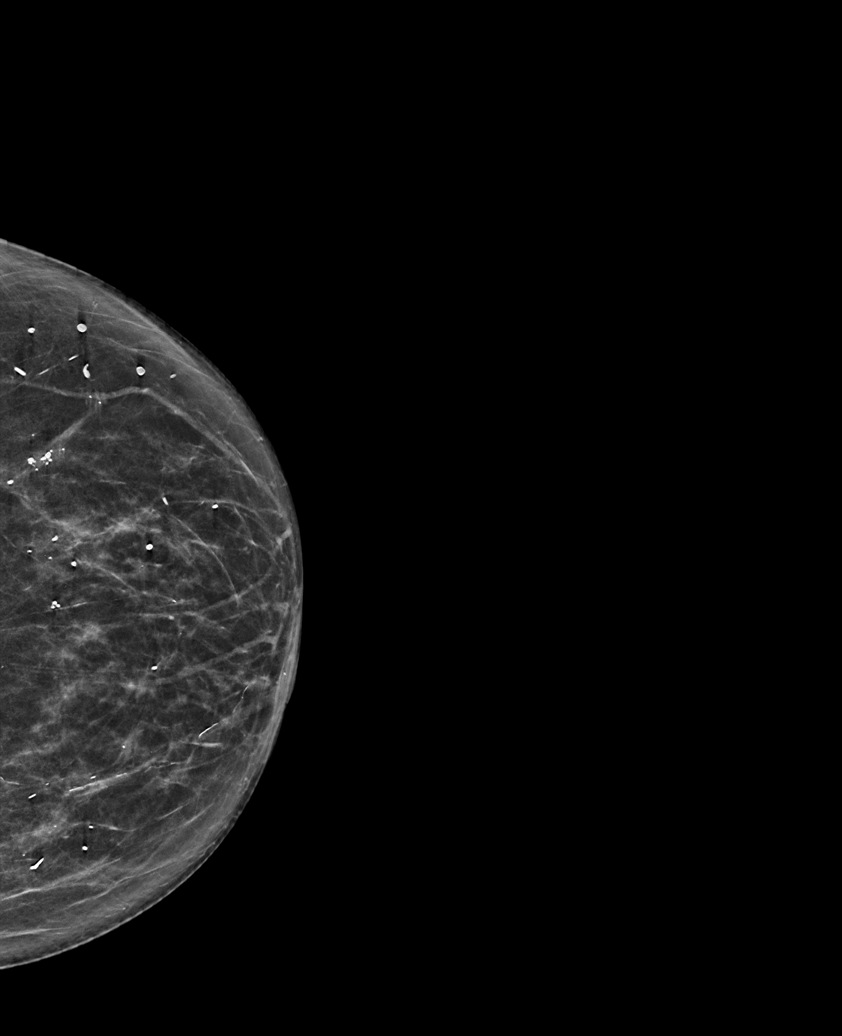

[L MLO tomo · tomo slice 37/74.0]
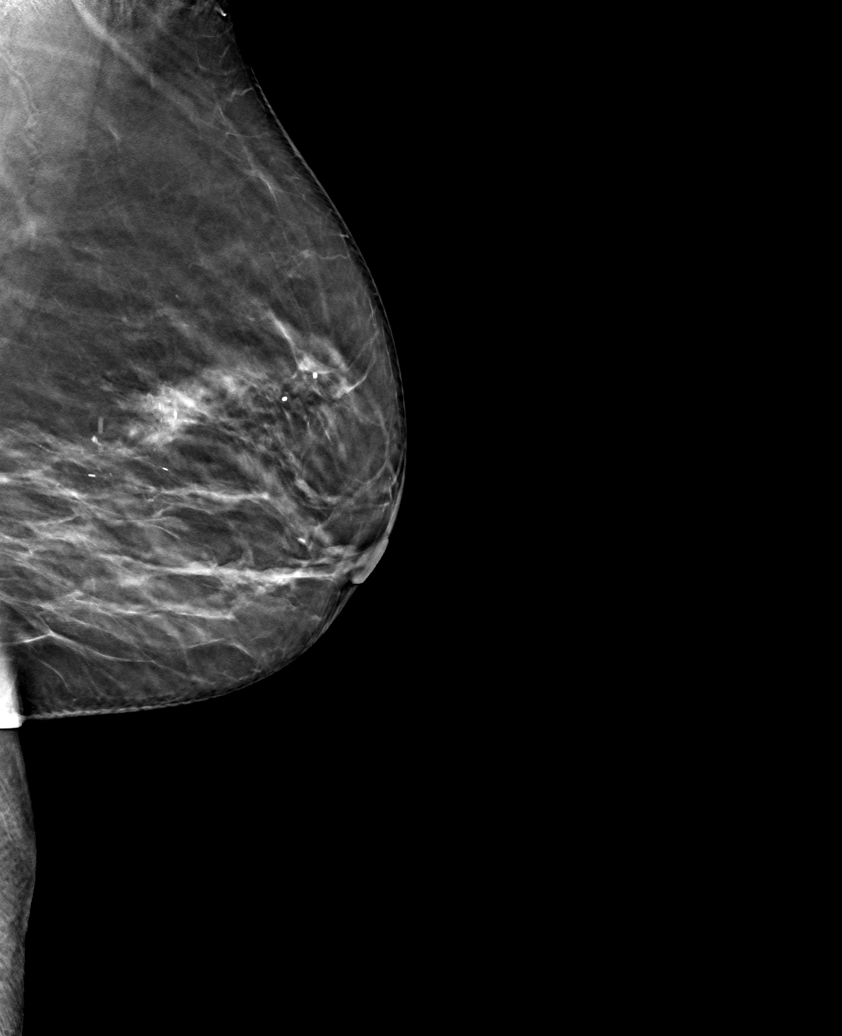

[6 of 30 positions shown; findings below may reference images not displayed]

ACR Breast Density Category c: The breast tissue is heterogeneously
dense, which may obscure small masses.
FINDINGS: There are no findings suspicious for malignancy. Images were
processed with CAD.
IMPRESSION: No mammographic evidence of malignancy. A result letter of this
screening mammogram will be mailed directly to the patient.

RECOMMENDATION:
Screening mammogram in one year. (Code:[id number])

BI-RADS CATEGORY  1: Negative.

## 2021-05-25 ENCOUNTER — Ambulatory Visit: Payer: Medicare Other | Admitting: Neurology

## 2021-06-26 ENCOUNTER — Emergency Department: Payer: Medicare Other

## 2021-06-26 ENCOUNTER — Inpatient Hospital Stay
Admission: EM | Admit: 2021-06-26 | Discharge: 2021-07-06 | DRG: 870 | Disposition: A | Discharge status: Home Health | Payer: Medicare Other | Attending: Pulmonary Disease | Admitting: Pulmonary Disease

## 2021-06-26 ENCOUNTER — Other Ambulatory Visit: Payer: Self-pay

## 2021-06-26 DIAGNOSIS — K5909 Other constipation: Secondary | ICD-10-CM | POA: Diagnosis present

## 2021-06-26 DIAGNOSIS — F05 Delirium due to known physiological condition: Secondary | ICD-10-CM | POA: Diagnosis present

## 2021-06-26 DIAGNOSIS — Z931 Gastrostomy status: Secondary | ICD-10-CM

## 2021-06-26 DIAGNOSIS — A4159 Other Gram-negative sepsis: Secondary | ICD-10-CM | POA: Diagnosis not present

## 2021-06-26 DIAGNOSIS — Z807 Family history of other malignant neoplasms of lymphoid, hematopoietic and related tissues: Secondary | ICD-10-CM

## 2021-06-26 DIAGNOSIS — E7800 Pure hypercholesterolemia, unspecified: Secondary | ICD-10-CM | POA: Diagnosis present

## 2021-06-26 DIAGNOSIS — I959 Hypotension, unspecified: Secondary | ICD-10-CM | POA: Diagnosis present

## 2021-06-26 DIAGNOSIS — Z885 Allergy status to narcotic agent status: Secondary | ICD-10-CM

## 2021-06-26 DIAGNOSIS — J9621 Acute and chronic respiratory failure with hypoxia: Secondary | ICD-10-CM | POA: Diagnosis not present

## 2021-06-26 DIAGNOSIS — J3800 Paralysis of vocal cords and larynx, unspecified: Secondary | ICD-10-CM | POA: Diagnosis present

## 2021-06-26 DIAGNOSIS — Z91013 Allergy to seafood: Secondary | ICD-10-CM

## 2021-06-26 DIAGNOSIS — Z930 Tracheostomy status: Secondary | ICD-10-CM

## 2021-06-26 DIAGNOSIS — R6521 Severe sepsis with septic shock: Secondary | ICD-10-CM | POA: Diagnosis present

## 2021-06-26 DIAGNOSIS — B961 Klebsiella pneumoniae [K. pneumoniae] as the cause of diseases classified elsewhere: Secondary | ICD-10-CM | POA: Diagnosis present

## 2021-06-26 DIAGNOSIS — Z79899 Other long term (current) drug therapy: Secondary | ICD-10-CM

## 2021-06-26 DIAGNOSIS — Z923 Personal history of irradiation: Secondary | ICD-10-CM

## 2021-06-26 DIAGNOSIS — E1142 Type 2 diabetes mellitus with diabetic polyneuropathy: Secondary | ICD-10-CM | POA: Diagnosis present

## 2021-06-26 DIAGNOSIS — J9622 Acute and chronic respiratory failure with hypercapnia: Secondary | ICD-10-CM | POA: Diagnosis present

## 2021-06-26 DIAGNOSIS — Z1612 Extended spectrum beta lactamase (ESBL) resistance: Secondary | ICD-10-CM | POA: Diagnosis present

## 2021-06-26 DIAGNOSIS — J9811 Atelectasis: Secondary | ICD-10-CM | POA: Diagnosis present

## 2021-06-26 DIAGNOSIS — Z794 Long term (current) use of insulin: Secondary | ICD-10-CM

## 2021-06-26 DIAGNOSIS — Z9221 Personal history of antineoplastic chemotherapy: Secondary | ICD-10-CM

## 2021-06-26 DIAGNOSIS — K219 Gastro-esophageal reflux disease without esophagitis: Secondary | ICD-10-CM | POA: Diagnosis present

## 2021-06-26 DIAGNOSIS — R4189 Other symptoms and signs involving cognitive functions and awareness: Secondary | ICD-10-CM

## 2021-06-26 DIAGNOSIS — Z882 Allergy status to sulfonamides status: Secondary | ICD-10-CM

## 2021-06-26 DIAGNOSIS — Z20822 Contact with and (suspected) exposure to covid-19: Secondary | ICD-10-CM | POA: Diagnosis present

## 2021-06-26 DIAGNOSIS — T17990A Other foreign object in respiratory tract, part unspecified in causing asphyxiation, initial encounter: Secondary | ICD-10-CM | POA: Diagnosis present

## 2021-06-26 DIAGNOSIS — R61 Generalized hyperhidrosis: Secondary | ICD-10-CM

## 2021-06-26 DIAGNOSIS — Z8719 Personal history of other diseases of the digestive system: Secondary | ICD-10-CM

## 2021-06-26 DIAGNOSIS — N390 Urinary tract infection, site not specified: Secondary | ICD-10-CM | POA: Diagnosis present

## 2021-06-26 DIAGNOSIS — Z9109 Other allergy status, other than to drugs and biological substances: Secondary | ICD-10-CM

## 2021-06-26 DIAGNOSIS — J398 Other specified diseases of upper respiratory tract: Secondary | ICD-10-CM | POA: Diagnosis present

## 2021-06-26 DIAGNOSIS — H409 Unspecified glaucoma: Secondary | ICD-10-CM | POA: Diagnosis present

## 2021-06-26 DIAGNOSIS — Z853 Personal history of malignant neoplasm of breast: Secondary | ICD-10-CM

## 2021-06-26 LAB — COMPREHENSIVE METABOLIC PANEL
ALT: 12 U/L (ref 0–44)
AST: 25 U/L (ref 15–41)
Albumin: 3.1 g/dL — ABNORMAL LOW (ref 3.5–5.0)
Alkaline Phosphatase: 61 U/L (ref 38–126)
Anion gap: 8 (ref 5–15)
BUN: 33 mg/dL — ABNORMAL HIGH (ref 8–23)
CO2: 33 mmol/L — ABNORMAL HIGH (ref 22–32)
Calcium: 9.6 mg/dL (ref 8.9–10.3)
Chloride: 97 mmol/L — ABNORMAL LOW (ref 98–111)
Creatinine, Ser: 0.86 mg/dL (ref 0.44–1.00)
GFR, Estimated: >60 mL/min (ref >60)
Glucose, Bld: 235 mg/dL — ABNORMAL HIGH (ref 70–99)
Potassium: 3.9 mmol/L (ref 3.5–5.1)
Sodium: 138 mmol/L (ref 135–145)
Total Bilirubin: 0.4 mg/dL (ref 0.3–1.2)
Total Protein: 7.6 g/dL (ref 6.5–8.1)

## 2021-06-26 LAB — TROPONIN I (HIGH SENSITIVITY): Troponin I (High Sensitivity): 11 ng/L (ref <18)

## 2021-06-26 LAB — BLOOD GAS, ARTERIAL
Acid-Base Excess: 10.9 mmol/L — ABNORMAL HIGH (ref 0.0–2.0)
Bicarbonate: 35.8 mmol/L — ABNORMAL HIGH (ref 20.0–28.0)
FIO2: 21 %
O2 Saturation: 95.7 %
Patient temperature: 37
pCO2 arterial: 47 mmHg (ref 32–48)
pH, Arterial: 7.49 — ABNORMAL HIGH (ref 7.35–7.45)
pO2, Arterial: 62 mmHg — ABNORMAL LOW (ref 83–108)

## 2021-06-26 LAB — CBC WITH DIFFERENTIAL/PLATELET
Abs Immature Granulocytes: 0.17 10*3/uL — ABNORMAL HIGH (ref 0.00–0.07)
Basophils Absolute: 0 10*3/uL (ref 0.0–0.1)
Basophils Relative: 0 %
Eosinophils Absolute: 0.1 10*3/uL (ref 0.0–0.5)
Eosinophils Relative: 1 %
HCT: 40.2 % (ref 36.0–46.0)
Hemoglobin: 12.4 g/dL (ref 12.0–15.0)
Immature Granulocytes: 1 %
Lymphocytes Relative: 9 %
Lymphs Abs: 1.2 10*3/uL (ref 0.7–4.0)
MCH: 27.3 pg (ref 26.0–34.0)
MCHC: 30.8 g/dL (ref 30.0–36.0)
MCV: 88.5 fL (ref 80.0–100.0)
Monocytes Absolute: 0.5 10*3/uL (ref 0.1–1.0)
Monocytes Relative: 4 %
Neutro Abs: 11.5 10*3/uL — ABNORMAL HIGH (ref 1.7–7.7)
Neutrophils Relative %: 85 %
Platelets: 203 10*3/uL (ref 150–400)
RBC: 4.54 MIL/uL (ref 3.87–5.11)
RDW: 13.7 % (ref 11.5–15.5)
WBC: 13.5 10*3/uL — ABNORMAL HIGH (ref 4.0–10.5)
nRBC: 0 % (ref 0.0–0.2)

## 2021-06-26 LAB — LACTIC ACID, PLASMA: Lactic Acid, Venous: 2 mmol/L (ref 0.5–1.9)

## 2021-06-26 LAB — MAGNESIUM: Magnesium: 2 mg/dL (ref 1.7–2.4)

## 2021-06-26 MED ORDER — LACTATED RINGERS IV BOLUS
1000.0000 mL | Freq: Once | INTRAVENOUS | Status: AC
  Administered 2021-06-26: 1000 mL via INTRAVENOUS

## 2021-06-26 MED ORDER — IOHEXOL 350 MG/ML SOLN
100.0000 mL | Freq: Once | INTRAVENOUS | Status: AC | PRN
  Administered 2021-06-26: 75 mL via INTRAVENOUS

## 2021-06-26 NOTE — ED Triage Notes (Signed)
Patient arrived via EMS from home. State patient was discharged from [facility] this evening. Patient has trach and is vent dependent at night. Patient was placed on new home vent and was on it for approximately three hours and reported she "felt bad" and had a headache. Husband reported patient became diaphoretic, with altered mental status and unarousable. Patient alert with spontaneous respirations on arrival to ED. 20 g left forearm. CBG 248, following commands and answering questions. ?

## 2021-06-26 NOTE — ED Provider Notes (Signed)
? ?[facility] ?Provider Note ? ? ? Event Date/Time  ? First MD Initiated Contact with Patient 06/26/21 2206   ?  (approximate) ? ? ?History  ? ?Respiratory Distress ? ? ?HPI ? ?Angela David is a 81 y.o. female who presents to the ED for evaluation of Respiratory Distress ?  ?Review ICU DC summary from 1/9.  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  She has since been at a long-term facility, until just coming home today under the care of her husband. ? ?She had a new home vent set up and was only on this home vent for the past 3 to 4 hours, per report, and EMS was called to their house twice in that time.  Patient reports that she feels "just awful" and only has generalized symptoms.  EMS reports high end-tidal CO2, but otherwise normal vital signs. ? ?Husband provides majority of history when he arrives later.  He reports that patient was just home for 3 to 4 hours on her new home ventilator.  He called EMS because patient had an episode of diaphoresis and poor responsiveness.  She was unable to be aroused by him and was sweaty all over.  He reports that she seems little bit better now ? ?Physical Exam  ? ?Triage Vital Signs: ?ED Triage Vitals  ?Enc Vitals Group  ?   BP   ?   Pulse   ?   Resp   ?   Temp   ?   Temp src   ?   SpO2   ?   Weight   ?   Height   ?   Head Circumference   ?   Peak Flow   ?   Pain Score   ?   Pain Loc   ?   Pain Edu?   ?   Excl. in [place]?   ? ? ?Most recent vital signs: ?Vitals:  ? 06/26/21 2235 06/26/21 2300  ?BP:  110/84  ?Pulse:  (!) 106  ?Resp:  20  ?Temp:    ?SpO2: 94% 92%  ? ? ?General: Awake, no distress.  Follows commands in all 4, alert and responsive to all questions, mouthing and gesturing ?CV:  Good peripheral perfusion.  Tachycardic and regular ?Resp:  Mild tachypnea . CTAB ?Abd:  No distention.  PEG tube in place, soft and benign ?MSK:  No deformity noted.  Poor muscle  mass ?Neuro:  No focal deficits appreciated. ?Other:   ? ? ?ED Results / Procedures / Treatments  ? ?Labs ?(all labs ordered are listed, but only abnormal results are displayed) ?Labs Reviewed  ?BLOOD GAS, ARTERIAL - Abnormal; Notable for the following components:  ?    Result Value  ? pH, Arterial 7.49 (*)   ? pO2, Arterial 62 (*)   ? Bicarbonate 35.8 (*)   ? Acid-Base Excess 10.9 (*)   ? All other components within normal limits  ?COMPREHENSIVE METABOLIC PANEL - Abnormal; Notable for the following components:  ? Chloride 97 (*)   ? CO2 33 (*)   ? Glucose, Bld 235 (*)   ? BUN 33 (*)   ? Albumin 3.1 (*)   ? All other components within normal limits  ?CBC WITH DIFFERENTIAL/PLATELET - Abnormal; Notable for the following components:  ? WBC 13.5 (*)   ? Neutro Abs 11.5 (*)   ? Abs Immature Granulocytes 0.17 (*)   ? All  other components within normal limits  ?CULTURE, BLOOD (SINGLE)  ?MAGNESIUM  ?URINALYSIS, ROUTINE W REFLEX MICROSCOPIC  ?PROCALCITONIN  ?PROCALCITONIN  ?LACTIC ACID, PLASMA  ?LACTIC ACID, PLASMA  ?TROPONIN I (HIGH SENSITIVITY)  ? ? ?EKG ?Sinus tachycardia the rate of 114 bpm.  Normal axis and intervals.  No STEMI. ? ?RADIOLOGY ?Right basilar atelectasis versus infiltrate on 1 view CXR reviewed by me ? ?Official radiology report(s): ?DG Chest Portable 1 View ? ?Result Date: 06/26/2021 ?CLINICAL DATA:  Shortness of breath EXAM: PORTABLE CHEST 1 VIEW COMPARISON:  03/02/2021 FINDINGS: Cardiac shadow is stable. Tracheostomy tube is noted in satisfactory position. Elevation the right hemidiaphragm is noted. No focal confluent infiltrate is seen. Very mild right basilar atelectasis is seen. No bony abnormality is noted. IMPRESSION: Mild right basilar atelectasis. Electronically Signed   By: Inez Catalina M.D.   On: 06/26/2021 22:43   ? ?PROCEDURES and INTERVENTIONS: ? ?.1-3 Lead EKG Interpretation ?Performed by: Vladimir Crofts, MD ?Authorized by: Vladimir Crofts, MD  ? ?  Interpretation: abnormal   ?  ECG rate:  110 ?   ECG rate assessment: tachycardic   ?  Rhythm: sinus tachycardia   ?  Ectopy: none   ?  Conduction: normal   ?.Critical Care ?Performed by: Vladimir Crofts, MD ?Authorized by: Vladimir Crofts, MD  ? ?Critical care provider statement:  ?  Critical care time (minutes):  30 ?  Critical care time was exclusive of:  Separately billable procedures and treating other patients ?  Critical care was necessary to treat or prevent imminent or life-threatening deterioration of the following conditions:  Respiratory failure ?  Critical care was time spent personally by me on the following activities:  Development of treatment plan with patient or surrogate, discussions with consultants, evaluation of patient's response to treatment, examination of patient, ordering and review of laboratory studies, ordering and review of radiographic studies, ordering and performing treatments and interventions, pulse oximetry, re-evaluation of patient's condition and review of old charts ? ?Medications  ?lactated ringers bolus 1,000 mL (has no administration in time range)  ?iohexol (OMNIPAQUE) 350 MG/ML injection 100 mL (75 mLs Intravenous Contrast Given 06/26/21 2318)  ? ? ? ?IMPRESSION / MDM / ASSESSMENT AND PLAN / ED COURSE  ?I reviewed the triage vital signs and the nursing notes. ? ?Chronic history patient is going home today presents after an episode of diaphoresis and poor responsiveness.  She is tachycardic and requiring blow-by oxygen through her trachea, but no acute distress.  Soft pressures, improving with IV fluids.  Blood work with mild leukocytosis and neutrophilia, hyperglycemia without acidosis or renal dysfunction.  EKG is nonischemic and troponin is low without signs of ACS.  ABG without respiratory acidosis or hypercarbia, but does have arterial hypoxia prior to starting blow-by oxygen.  Considering her bedbound status, certainly concern for PE versus repeat aspiration pneumonia versus PTX.  CTs are pending at the time of signout to  oncoming provider.  Concerned she may need to be admitted again ? ?Clinical Course as of 06/26/21 2338  ?Mon Jun 26, 2021  ?2245 Reassessed and discussed with husband at the bedside. [DS]  ?  ?Clinical Course User Index ?[DS] Vladimir Crofts, MD  ? ? ? ?FINAL CLINICAL IMPRESSION(S) / ED DIAGNOSES  ? ?Final diagnoses:  ?Diaphoresis  ?Unresponsive episode  ? ? ? ?Rx / DC Orders  ? ?ED Discharge Orders   ? ? None  ? ?  ? ? ? ?Note:  This document was prepared using Dragon voice  recognition software and may include unintentional dictation errors. ?  ?Vladimir Crofts, MD ?06/26/21 2340 ? ?

## 2021-06-26 NOTE — ED Notes (Signed)
Patient incontinent of large BM in brief. Pericare performed,  brief changed and repositioned for comfort.  ?

## 2021-06-27 DIAGNOSIS — I959 Hypotension, unspecified: Secondary | ICD-10-CM | POA: Diagnosis present

## 2021-06-27 DIAGNOSIS — E1142 Type 2 diabetes mellitus with diabetic polyneuropathy: Secondary | ICD-10-CM | POA: Diagnosis present

## 2021-06-27 DIAGNOSIS — Z20822 Contact with and (suspected) exposure to covid-19: Secondary | ICD-10-CM | POA: Diagnosis present

## 2021-06-27 DIAGNOSIS — Z882 Allergy status to sulfonamides status: Secondary | ICD-10-CM | POA: Diagnosis not present

## 2021-06-27 DIAGNOSIS — B961 Klebsiella pneumoniae [K. pneumoniae] as the cause of diseases classified elsewhere: Secondary | ICD-10-CM | POA: Diagnosis present

## 2021-06-27 DIAGNOSIS — Z91013 Allergy to seafood: Secondary | ICD-10-CM | POA: Diagnosis not present

## 2021-06-27 DIAGNOSIS — Z9109 Other allergy status, other than to drugs and biological substances: Secondary | ICD-10-CM | POA: Diagnosis not present

## 2021-06-27 DIAGNOSIS — N390 Urinary tract infection, site not specified: Secondary | ICD-10-CM | POA: Diagnosis present

## 2021-06-27 DIAGNOSIS — A419 Sepsis, unspecified organism: Secondary | ICD-10-CM | POA: Diagnosis not present

## 2021-06-27 DIAGNOSIS — J9811 Atelectasis: Secondary | ICD-10-CM | POA: Diagnosis present

## 2021-06-27 DIAGNOSIS — J3800 Paralysis of vocal cords and larynx, unspecified: Secondary | ICD-10-CM | POA: Diagnosis present

## 2021-06-27 DIAGNOSIS — Z853 Personal history of malignant neoplasm of breast: Secondary | ICD-10-CM | POA: Diagnosis not present

## 2021-06-27 DIAGNOSIS — K5909 Other constipation: Secondary | ICD-10-CM | POA: Diagnosis present

## 2021-06-27 DIAGNOSIS — J9622 Acute and chronic respiratory failure with hypercapnia: Secondary | ICD-10-CM | POA: Diagnosis present

## 2021-06-27 DIAGNOSIS — F05 Delirium due to known physiological condition: Secondary | ICD-10-CM | POA: Diagnosis present

## 2021-06-27 DIAGNOSIS — J9621 Acute and chronic respiratory failure with hypoxia: Secondary | ICD-10-CM | POA: Diagnosis present

## 2021-06-27 DIAGNOSIS — Z885 Allergy status to narcotic agent status: Secondary | ICD-10-CM | POA: Diagnosis not present

## 2021-06-27 DIAGNOSIS — R6521 Severe sepsis with septic shock: Secondary | ICD-10-CM | POA: Diagnosis present

## 2021-06-27 DIAGNOSIS — A4159 Other Gram-negative sepsis: Secondary | ICD-10-CM | POA: Diagnosis present

## 2021-06-27 DIAGNOSIS — E7800 Pure hypercholesterolemia, unspecified: Secondary | ICD-10-CM | POA: Diagnosis present

## 2021-06-27 DIAGNOSIS — Z1612 Extended spectrum beta lactamase (ESBL) resistance: Secondary | ICD-10-CM | POA: Diagnosis present

## 2021-06-27 DIAGNOSIS — T17990A Other foreign object in respiratory tract, part unspecified in causing asphyxiation, initial encounter: Secondary | ICD-10-CM | POA: Diagnosis present

## 2021-06-27 DIAGNOSIS — Z930 Tracheostomy status: Secondary | ICD-10-CM | POA: Diagnosis not present

## 2021-06-27 DIAGNOSIS — J398 Other specified diseases of upper respiratory tract: Secondary | ICD-10-CM | POA: Diagnosis present

## 2021-06-27 DIAGNOSIS — Z7189 Other specified counseling: Secondary | ICD-10-CM | POA: Diagnosis not present

## 2021-06-27 LAB — HEMOGLOBIN A1C
Hgb A1c MFr Bld: 6.1 % — ABNORMAL HIGH (ref 4.8–5.6)
Mean Plasma Glucose: 128.37 mg/dL

## 2021-06-27 LAB — LACTIC ACID, PLASMA: Lactic Acid, Venous: 1.7 mmol/L (ref 0.5–1.9)

## 2021-06-27 LAB — BASIC METABOLIC PANEL
Anion gap: 11 (ref 5–15)
Anion gap: 7 (ref 5–15)
BUN: 29 mg/dL — ABNORMAL HIGH (ref 8–23)
BUN: 29 mg/dL — ABNORMAL HIGH (ref 8–23)
CO2: 27 mmol/L (ref 22–32)
CO2: 28 mmol/L (ref 22–32)
Calcium: 8.4 mg/dL — ABNORMAL LOW (ref 8.9–10.3)
Calcium: 9.1 mg/dL (ref 8.9–10.3)
Chloride: 99 mmol/L (ref 98–111)
Chloride: 103 mmol/L (ref 98–111)
Creatinine, Ser: 0.79 mg/dL (ref 0.44–1.00)
Creatinine, Ser: 0.76 mg/dL (ref 0.44–1.00)
GFR, Estimated: >60 mL/min (ref >60)
GFR, Estimated: >60 mL/min (ref >60)
Glucose, Bld: 94 mg/dL (ref 70–99)
Glucose, Bld: 88 mg/dL (ref 70–99)
Potassium: 3.9 mmol/L (ref 3.5–5.1)
Potassium: 3.8 mmol/L (ref 3.5–5.1)
Sodium: 137 mmol/L (ref 135–145)
Sodium: 138 mmol/L (ref 135–145)

## 2021-06-27 LAB — CBC
HCT: 32.4 % — ABNORMAL LOW (ref 36.0–46.0)
Hemoglobin: 10.1 g/dL — ABNORMAL LOW (ref 12.0–15.0)
MCH: 27.2 pg (ref 26.0–34.0)
MCHC: 31.2 g/dL (ref 30.0–36.0)
MCV: 87.1 fL (ref 80.0–100.0)
Platelets: 169 10*3/uL (ref 150–400)
RBC: 3.72 MIL/uL — ABNORMAL LOW (ref 3.87–5.11)
RDW: 13.6 % (ref 11.5–15.5)
WBC: 8.4 10*3/uL (ref 4.0–10.5)
nRBC: 0 % (ref 0.0–0.2)

## 2021-06-27 LAB — PROCALCITONIN
Procalcitonin: <0.1 ng/mL
Procalcitonin: <0.1 ng/mL

## 2021-06-27 LAB — URINALYSIS, ROUTINE W REFLEX MICROSCOPIC
Bilirubin Urine: NEGATIVE
Glucose, UA: NEGATIVE mg/dL
Hgb urine dipstick: NEGATIVE
Ketones, ur: NEGATIVE mg/dL
Nitrite: NEGATIVE
Protein, ur: 30 mg/dL — AB
Specific Gravity, Urine: >1.046 — ABNORMAL HIGH (ref 1.005–1.030)
WBC, UA: >50 WBC/hpf — ABNORMAL HIGH (ref 0–5)
pH: 6 (ref 5.0–8.0)

## 2021-06-27 LAB — TROPONIN I (HIGH SENSITIVITY): Troponin I (High Sensitivity): 26 ng/L — ABNORMAL HIGH (ref <18)

## 2021-06-27 LAB — CBG MONITORING, ED
Glucose-Capillary: 108 mg/dL — ABNORMAL HIGH (ref 70–99)
Glucose-Capillary: 116 mg/dL — ABNORMAL HIGH (ref 70–99)
Glucose-Capillary: 153 mg/dL — ABNORMAL HIGH (ref 70–99)
Glucose-Capillary: 83 mg/dL (ref 70–99)
Glucose-Capillary: 84 mg/dL (ref 70–99)
Glucose-Capillary: 85 mg/dL (ref 70–99)

## 2021-06-27 LAB — PHOSPHORUS: Phosphorus: 2.2 mg/dL — ABNORMAL LOW (ref 2.5–4.6)

## 2021-06-27 LAB — MAGNESIUM: Magnesium: 1.7 mg/dL (ref 1.7–2.4)

## 2021-06-27 MED ORDER — POLYETHYLENE GLYCOL 3350 17 G PO PACK
17.0000 g | PACK | Freq: Every day | ORAL | Status: DC | PRN
Start: 2021-06-27 — End: 2021-07-06
  Filled 2021-06-27: qty 1

## 2021-06-27 MED ORDER — IPRATROPIUM-ALBUTEROL 0.5-2.5 (3) MG/3ML IN SOLN: 3.0000 mL | RESPIRATORY_TRACT | Status: DC | PRN

## 2021-06-27 MED ORDER — CEFTRIAXONE SODIUM 1 G IJ SOLR
1.0000 g | Freq: Once | INTRAMUSCULAR | Status: AC
  Administered 2021-06-27: 1 g via INTRAVENOUS
  Filled 2021-06-27: qty 10

## 2021-06-27 MED ORDER — ATORVASTATIN CALCIUM 10 MG PO TABS
10.0000 mg | ORAL_TABLET | Freq: Every day | ORAL | Status: DC
Start: 2021-06-27 — End: 2021-07-06
  Administered 2021-06-27 – 2021-07-05 (×9): 10 mg
  Filled 2021-06-27 (×10): qty 1

## 2021-06-27 MED ORDER — ACETAMINOPHEN 325 MG PO TABS
650.0000 mg | ORAL_TABLET | Freq: Four times a day (QID) | ORAL | Status: DC | PRN
  Administered 2021-06-28 – 2021-06-30 (×4): 650 mg via ORAL
  Filled 2021-06-27 (×5): qty 2

## 2021-06-27 MED ORDER — HYDROXYZINE HCL 25 MG PO TABS
25.0000 mg | ORAL_TABLET | Freq: Two times a day (BID) | ORAL | Status: DC
  Administered 2021-06-27 – 2021-07-06 (×19): 25 mg
  Filled 2021-06-27 (×20): qty 1

## 2021-06-27 MED ORDER — POTASSIUM PHOSPHATES 15 MMOLE/5ML IV SOLN
15.0000 mmol | Freq: Once | INTRAVENOUS | Status: AC
  Administered 2021-06-27: 15 mmol via INTRAVENOUS
  Filled 2021-06-27: qty 5

## 2021-06-27 MED ORDER — PRAVASTATIN SODIUM 20 MG PO TABS: 20.0000 mg | ORAL_TABLET | Freq: Every day | ORAL | Status: DC

## 2021-06-27 MED ORDER — PANTOPRAZOLE 2 MG/ML SUSPENSION
40.0000 mg | Freq: Every day | ORAL | Status: DC
  Administered 2021-06-27 – 2021-07-06 (×10): 40 mg
  Filled 2021-06-27 (×11): qty 20

## 2021-06-27 MED ORDER — INSULIN ASPART 100 UNIT/ML IJ SOLN
0.0000 [IU] | INTRAMUSCULAR | Status: DC
  Administered 2021-06-27 – 2021-06-28 (×2): 2 [IU] via SUBCUTANEOUS
  Administered 2021-06-28 (×2): 1 [IU] via SUBCUTANEOUS
  Administered 2021-06-28: 3 [IU] via SUBCUTANEOUS
  Administered 2021-06-29: 2 [IU] via SUBCUTANEOUS
  Administered 2021-06-29: 1 [IU] via SUBCUTANEOUS
  Administered 2021-06-29: 2 [IU] via SUBCUTANEOUS
  Administered 2021-06-29 – 2021-06-30 (×2): 3 [IU] via SUBCUTANEOUS
  Administered 2021-06-30: 1 [IU] via SUBCUTANEOUS
  Administered 2021-06-30 (×4): 2 [IU] via SUBCUTANEOUS
  Administered 2021-07-01: 1 [IU] via SUBCUTANEOUS
  Administered 2021-07-01 (×4): 2 [IU] via SUBCUTANEOUS
  Administered 2021-07-02: 3 [IU] via SUBCUTANEOUS
  Administered 2021-07-02: 2 [IU] via SUBCUTANEOUS
  Administered 2021-07-02 (×2): 3 [IU] via SUBCUTANEOUS
  Administered 2021-07-02 (×3): 2 [IU] via SUBCUTANEOUS
  Administered 2021-07-03: 3 [IU] via SUBCUTANEOUS
  Administered 2021-07-03: 2 [IU] via SUBCUTANEOUS
  Administered 2021-07-03: 1 [IU] via SUBCUTANEOUS
  Administered 2021-07-03: 3 [IU] via SUBCUTANEOUS
  Administered 2021-07-03: 2 [IU] via SUBCUTANEOUS
  Administered 2021-07-03: 1 [IU] via SUBCUTANEOUS
  Administered 2021-07-04: 2 [IU] via SUBCUTANEOUS
  Administered 2021-07-04 (×3): 3 [IU] via SUBCUTANEOUS
  Administered 2021-07-04: 1 [IU] via SUBCUTANEOUS
  Administered 2021-07-05: 3 [IU] via SUBCUTANEOUS
  Administered 2021-07-05: 2 [IU] via SUBCUTANEOUS
  Administered 2021-07-05 (×3): 3 [IU] via SUBCUTANEOUS
  Administered 2021-07-05: 2 [IU] via SUBCUTANEOUS
  Administered 2021-07-06: 3 [IU] via SUBCUTANEOUS
  Administered 2021-07-06: 1 [IU] via SUBCUTANEOUS
  Administered 2021-07-06: 2 [IU] via SUBCUTANEOUS
  Administered 2021-07-06: 3 [IU] via SUBCUTANEOUS
  Filled 2021-06-27 (×46): qty 1

## 2021-06-27 MED ORDER — PANTOPRAZOLE 2 MG/ML SUSPENSION: 40.0000 mg | Freq: Every day | ORAL | Status: DC

## 2021-06-27 MED ORDER — POLYETHYLENE GLYCOL 3350 17 G PO PACK: 17.0000 g | PACK | Freq: Every day | ORAL | Status: DC | PRN

## 2021-06-27 MED ORDER — IPRATROPIUM-ALBUTEROL 0.5-2.5 (3) MG/3ML IN SOLN
3.0000 mL | Freq: Three times a day (TID) | RESPIRATORY_TRACT | Status: DC
  Administered 2021-06-27 – 2021-07-06 (×28): 3 mL via RESPIRATORY_TRACT
  Filled 2021-06-27 (×28): qty 3

## 2021-06-27 MED ORDER — MAGNESIUM SULFATE 2 GM/50ML IV SOLN
2.0000 g | Freq: Once | INTRAVENOUS | Status: AC
  Administered 2021-06-27: 2 g via INTRAVENOUS
  Filled 2021-06-27: qty 50

## 2021-06-27 MED ORDER — SODIUM CHLORIDE 0.9 % IV SOLN
1.0000 g | INTRAVENOUS | Status: DC
  Administered 2021-06-27 – 2021-06-28 (×2): 1 g via INTRAVENOUS
  Filled 2021-06-27: qty 1
  Filled 2021-06-27: qty 10

## 2021-06-27 MED ORDER — PANTOPRAZOLE SODIUM 40 MG IV SOLR
40.0000 mg | Freq: Every day | INTRAVENOUS | Status: DC
  Administered 2021-06-27: 40 mg via INTRAVENOUS
  Filled 2021-06-27: qty 10

## 2021-06-27 MED ORDER — ENOXAPARIN SODIUM 40 MG/0.4ML IJ SOSY
40.0000 mg | PREFILLED_SYRINGE | Freq: Every day | INTRAMUSCULAR | Status: DC
  Administered 2021-06-27 – 2021-07-05 (×9): 40 mg via SUBCUTANEOUS
  Filled 2021-06-27 (×10): qty 0.4

## 2021-06-27 MED ORDER — SODIUM CHLORIDE 0.9 % IV BOLUS (SEPSIS)
1000.0000 mL | Freq: Once | INTRAVENOUS | Status: AC
  Administered 2021-06-27: 1000 mL via INTRAVENOUS

## 2021-06-27 MED ORDER — DOCUSATE SODIUM 100 MG PO CAPS: 100.0000 mg | ORAL_CAPSULE | Freq: Two times a day (BID) | ORAL | Status: DC | PRN

## 2021-06-27 MED ORDER — HEPARIN SODIUM (PORCINE) 5000 UNIT/ML IJ SOLN: 5000.0000 [IU] | Freq: Three times a day (TID) | INTRAMUSCULAR | Status: DC

## 2021-06-27 MED ORDER — DOCUSATE SODIUM 50 MG/5ML PO LIQD
100.0000 mg | Freq: Two times a day (BID) | ORAL | Status: DC | PRN
  Administered 2021-07-04 – 2021-07-05 (×2): 100 mg
  Filled 2021-06-27 (×3): qty 10

## 2021-06-27 MED ORDER — ESCITALOPRAM OXALATE 20 MG PO TABS
30.0000 mg | ORAL_TABLET | Freq: Every day | ORAL | Status: DC
  Administered 2021-06-27 – 2021-07-06 (×10): 30 mg
  Filled 2021-06-27 (×2): qty 1
  Filled 2021-06-27: qty 3
  Filled 2021-06-27 (×4): qty 1
  Filled 2021-06-27: qty 3
  Filled 2021-06-27 (×2): qty 1

## 2021-06-27 MED ORDER — ALPRAZOLAM 0.25 MG PO TABS
0.2500 mg | ORAL_TABLET | Freq: Three times a day (TID) | ORAL | Status: DC | PRN
  Administered 2021-06-27 – 2021-06-30 (×5): 0.25 mg via ORAL
  Filled 2021-06-27 (×5): qty 1

## 2021-06-27 MED ORDER — GABAPENTIN 300 MG PO CAPS
300.0000 mg | ORAL_CAPSULE | Freq: Three times a day (TID) | ORAL | Status: DC
Start: 2021-06-27 — End: 2021-07-06
  Administered 2021-06-27 – 2021-07-06 (×29): 300 mg
  Filled 2021-06-27 (×30): qty 1

## 2021-06-27 NOTE — ED Notes (Addendum)
Angela David messaged via secure chat to notify of low BP readings while patient sleeping, lowest 79/69 at 0500. Patient wakes up to verbal stimuli and follows commands but is much more drowsy than when she first arrived to ED.  ?

## 2021-06-27 NOTE — H&P (Signed)
? ? ?NAME:  Angela David, MRN:  [id number], DOB:  09/21/1940, LOS: 0 ?ADMISSION DATE:  06/26/2021, CONSULTATION DATE:  06/27/2021 ?REFERRING MD:  Pryor Curia MD  CHIEF COMPLAINT: Unresponsiveness  ? ? 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 ? ?Patient was recently discharged from the hospital on 03/06/2021 to [facility]  following prolonged admission for Acute on Chronic Hypoxic & Respiratory Failure Secondary to suspected aspiration pneumonia, ? healthcare associated pneumonia (? COLONIZATION) and influenza A viral infection and sepsis due to aspiration pneumonia and suspected abdominal abscess. Per ED notes, patient was discharged from [facility] yesterday evening and was placed on new home vent for approximately 3 hours when she developed respiratory distress. Per patient's husband, altered, diaphoretic and unresponsive therefore EMS was called. ? ?ED Course: In the emergency department, the temperature was 37.1?C, the heart rate 120 beats/minute, the blood pressure  109/47 mm Hg, the respiratory rate 21 breaths/minute, and the oxygen saturation 94 % on TC '@28'$ %.  Stat chest x-ray was obtained and showed mild right basilar atelectasis CT angio negative for acute pulmonary embolism.  CT of the abdomen and pelvis with no acute abnormality. ?Pertinent Labs in Red/Diagnostics Findings: ?Na+/ K+: 8/3.9 ?Glucose: 235 ?BUN/Cr.:  33/0.86 ?  ?WBC/ TMAX: 13.5/ afebrile ?PCT: negative <0.10 ?Lactic acid: 2.0 ?COVID PCR: Negative ?Arterial Blood Gas result:  pO2 62; pCO2 47; pH 7.49;  HCO3 35.8, %O2 Sat 95.7.  ? ?Patient was placed on the ventilator with a tidal volume of 450 and rate of 24.  She  received IV fluids and was started on IV antibiotics for possible urosepsis.  PCCM consulted for vent management ? ?Past Medical History  ? 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 ?Significant Hospital Events   ?5/2: Admitted to the ICU with possible sepsis secondary to UTI and chronic vent management ? ?Consults:  ?PCCM ? ?Procedures:  ?None ? ?Significant Diagnostic Tests:  ?5/2: CTA abdomen and pelvis> no abnormality ?5/2: CTA Chest> no evidence of acute pulmonary embolism ? ?Micro Data:  ?5/2: Blood culture x2> ?5/2: Urine Culture> ?5/2: MRSA PCR>>  ? ?Antimicrobials:  ?Ceftriaxone 5/2> ? ?OBJECTIVE  ?Blood pressure 95/71, pulse 82, temperature 98.3 ?F (36.8 ?C), temperature source Rectal, resp. rate (!) 21, height '5\' 4"'$  (1.626 m), weight 68.5 kg, SpO2 100 %. ?   ?Vent Mode: AC ?FiO2 (%):  [24 %-28 %] 24 % ?Set Rate:  [18 bmp] 18 bmp ?Vt Set:  [450 mL] 450 mL ?PEEP:  [5 cmH20] 5 cmH20  ? ?Intake/Output Summary (Last 24 hours) at 06/27/2021 0438 ?Last  data filed at 06/27/2021 0415 ?Gross per 24 hour  ?Intake 2100 ml  ?Output --  ?Net 2100 ml  ? ?Filed Weights  ? 06/26/21 2220  ?Weight: 68.5 kg  ? ?Physical Examination  ?GENERAL: year-old critically ill patient lying in the bed vented ?EYES: Pupils equal, round, reactive to light and accommodation. No scleral icterus. Extraocular muscles intact.  ?HEENT: Head atraumatic, normocephalic.  Oropharynx and nasopharynx clear.  ?NECK:  Supple, no jugular venous distention. No thyroid enlargement, no tenderness.  ?LUNGS: Normal breath sounds bilaterally, no wheezing, rales,rhonchi or crepitation. No use of accessory muscles of respiration.  ?CARDIOVASCULAR: S1, S2 normal. No murmurs, rubs, or gallops.  ?ABDOMEN: Soft, nontender, nondistended. Bowel sounds present. No organomegaly or mass. PEG in place.  ?EXTREMITIES: No pedal edema, cyanosis, or clubbing.  ?NEUROLOGIC: Cranial nerves II through XII are intact.  Muscle strength not checked. Sensation intact. Gait not checked.  ?PSYCHIATRIC: The patient is alert and oriented x 1, nods appropriately  ?SKIN: No obvious rash, lesion, or ulcer.  ? ?Labs/imaging that I havepersonally reviewed  ?(right click and "Reselect all SmartList Selections" daily)  ? ?  ?Labs   ?CBC: ?Recent Labs  ?Lab 06/26/21 ?2209  ?WBC 13.5*  ?NEUTROABS 11.5*  ?HGB 12.4  ?HCT 40.2  ?MCV 88.5  ?PLT 203  ? ? ?Basic Metabolic Panel: ?Recent Labs  ?Lab 06/26/21 ?2209  ?NA 138  ?K 3.9  ?CL 97*  ?CO2 33*  ?GLUCOSE 235*  ?BUN 33*  ?CREATININE 0.86  ?CALCIUM 9.6  ?MG 2.0  ? ?GFR: ?Estimated Creatinine Clearance: 51.2 mL/min (by C-G formula based on SCr of 0.86 mg/dL). ?Recent Labs  ?Lab 06/26/21 ?2209 06/27/21 ?0056  ?PROCALCITON <0.10  --   ?WBC 13.5*  --   ?LATICACIDVEN 2.0* 1.7  ? ? ?Liver Function Tests: ?Recent Labs  ?Lab 06/26/21 ?2209  ?AST 25  ?ALT 12  ?ALKPHOS 61  ?BILITOT 0.4  ?PROT 7.6  ?ALBUMIN 3.1*  ? ?No results for input(s): LIPASE, AMYLASE in the last 168 hours. ?No results for input(s): AMMONIA in the last 168 hours. ? ?ABG ?   ?Component Value Date/Time  ? PHART 7.49 (H) 06/26/2021 2205  ? PCO2ART 47 06/26/2021 2205  ? PO2ART 62 (L) 06/26/2021 2205  ? HCO3 35.8 (H) 06/26/2021 2205  ? ACIDBASEDEF 0.9 01/19/2020 1024  ? O2SAT 95.7 06/26/2021 2205  ?  ? ?Coagulation Profile: ?No results for input(s): INR, PROTIME in the last 168 hours. ? ?Cardiac Enzymes: ?No results for  input(s): CKTOTAL, CKMB, CKMBINDEX, TROPONINI in the last 168 hours. ? ?HbA1C: ?Hgb A1c MFr Bld  ?Date/Time Value Ref Range Status  ?01/20/2020 07:19 AM 6.3 (H) 4.8 - 5.6 % Final  ?  Comment:  ?  (NOTE) ?Pre diabetes:          5.7%-6.4% ? ?Diabetes:              >6.4% ? ?Glycemic control for   <7.0% ?adults with diabetes ?  ?03/21/2019 03:24 AM 6.6 (H) 4.8 - 5.6 % Final  ?  Comment:  ?  (NOTE) ?Pre diabetes:          5.7%-6.4% ?Diabetes:              >6.4% ?Glycemic control for   <7.0% ?adults with diabetes ?  ? ? ?CBG: ?No results for input(s): GLUCAP in the last 168 hours. ? ?Review of Systems:   ?Unable to obtain patient is currently on the vent ? 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  ? ?Surgical History   ? ?Past Surgical History:  ?Procedure Laterality Date  ? ABDOMINAL HYSTERECTOMY    ? BREAST BIOPSY Right 2008  ? neg  ? BREAST BIOPSY Right 06/30/2020  ? "Q" clip-path pending  ? BREAST EXCISIONAL BIOPSY Right 2002  ? positive  ? BREAST LUMPECTOMY Right 2002  ? chemo and radiation  ? BREAST LUMPECTOMY WITH RADIOFREQUENCY TAG IDENTIFICATION Right 10/18/2020  ? Procedure: BREAST LUMPECTOMY WITH RADIOFREQUENCY TAG IDENTIFICATION;  Surgeon: Jules Husbands, MD;  Location: [facility] ORS;  Service: General;  Laterality: Right;  ? BREAST SURGERY Right 2002  ? LUMPECTOMY, radiation, chemotherapy  ? CATARACT EXTRACTION, BILATERAL    ? CHOLECYSTECTOMY N/A 12/10/2017  ? Procedure: LAPAROSCOPIC  CHOLECYSTECTOMY;  Surgeon: Benjamine Sprague, DO;  Location: [facility] ORS;  Service: General;  Laterality: N/A;  ? COLON RESECTION Right 11/06/2018  ? Procedure: HAND ASSISTED LAPAROSCOPIC COLON RESECTION, RIGHT;  Surgeon: Jules Husbands

## 2021-06-27 NOTE — Progress Notes (Signed)
Pharmacy Electrolyte Monitoring Consult: ? ?Pharmacy consulted to assist in monitoring and replacing electrolytes in this 81 y.o. female admitted on 06/26/2021 with Respiratory Distress ? ? ?Labs: ? ?Sodium (mmol/L)  ?Date Value  ?06/27/2021 137  ?08/28/2016 143  ?12/17/2011 139  ? ?Potassium (mmol/L)  ?Date Value  ?06/27/2021 3.9  ?12/17/2011 4.1  ? ?Magnesium (mg/dL)  ?Date Value  ?06/27/2021 1.7  ?12/17/2011 2.2  ? ?Phosphorus (mg/dL)  ?Date Value  ?06/27/2021 2.2 (L)  ?12/16/2011 2.2 (L)  ? ?Calcium (mg/dL)  ?Date Value  ?06/27/2021 8.4 (L)  ? ?Calcium, Total (mg/dL)  ?Date Value  ?12/17/2011 8.8  ? ?Albumin (g/dL)  ?Date Value  ?06/26/2021 3.1 (L)  ?08/28/2016 4.3  ?12/16/2011 4.1  ? ? ?HX;s/p chronic trach and PEG.  recently discharged on 03/06/2021 to [facility]  following prolonged admission for Acute on Chronic Hypoxic & Respiratory Failure. Discontinued from [facility] on 06/26/21 ?-suspect Aspiration PNA, ?UTI ? ?Assessment/Plan: ?Mag 1.7- will order Magnesium sulfate 2 gm IV x 1 ?Phos 2.2- will order Potassium phosphate 15 mmol IV x1 ?-will f/u labs in am ? ?Angela David ?06/27/2021 ?7:40 AM ?                     ? ? ? ? ?  ?

## 2021-06-27 NOTE — ED Provider Notes (Signed)
12:35 AM  Assumed care at shift change.  Patient here after an episode of decreased responsiveness, diaphoresis and shortness of breath at home.  Just discharged from a rehab facility with trach in place on ventilator at home.  Patient states that she now is feeling better and husband feels she is back to baseline.  Lab work shows a leukocytosis which appears to be chronic over the past several weeks.  Initial lactic is 2.0.  Getting IV fluids and will recheck.  Procalcitonin negative.  CTs of the chest, abdomen pelvis showed no acute abnormality when reviewed by myself and radiologist.  Will obtain urinalysis. ? ? ?2:30 AM Pt appears to have a urinary tract infection.  We will add on a urine culture and give Rocephin.  She has had intermittent hypotension and tachycardia.  We will continue IV fluids.  Her end-tidal CO2 is now running in the upper 50s to 60s but her mental status is still improved per her husband.  We will place her on her ventilator with a tidal volume of 450 and rate of 24.  Lactic has improved to 1.7.  Troponin has gone up from 11 now to 26 but she is not having chest pain.  Discussed with Rufina Falco, NP with ICU for admission.  I am concerned for possible urosepsis.  Family comfortable with this plan. ? ? ?I reviewed all nursing notes and pertinent previous records as available.  I have reviewed and interpreted any and all EKGs, lab and urine results, imaging and radiology reports (as available). ? ? ? ?CRITICAL CARE ?Performed by: Cyril Mourning Amalia Edgecombe ? ? ?Total critical care time: 45 minutes ? ?Critical care time was exclusive of separately billable procedures and treating other patients. ? ?Critical care was necessary to treat or prevent imminent or life-threatening deterioration. ? ?Critical care was time spent personally by me on the following activities: development of treatment plan with patient and/or surrogate as well as nursing, discussions with consultants, evaluation of patient's  response to treatment, examination of patient, obtaining history from patient or surrogate, ordering and performing treatments and interventions, ordering and review of laboratory studies, ordering and review of radiographic studies, pulse oximetry and re-evaluation of patient's condition. ? ?  ?Tatym Schermer, Delice Bison, DO ?06/27/21 0304 ? ?

## 2021-06-28 ENCOUNTER — Encounter: Payer: Self-pay | Admitting: Pulmonary Disease

## 2021-06-28 ENCOUNTER — Inpatient Hospital Stay: Payer: Medicare Other

## 2021-06-28 DIAGNOSIS — Z7189 Other specified counseling: Secondary | ICD-10-CM | POA: Diagnosis not present

## 2021-06-28 DIAGNOSIS — J9622 Acute and chronic respiratory failure with hypercapnia: Secondary | ICD-10-CM | POA: Diagnosis not present

## 2021-06-28 DIAGNOSIS — J9621 Acute and chronic respiratory failure with hypoxia: Secondary | ICD-10-CM | POA: Diagnosis not present

## 2021-06-28 LAB — MRSA NEXT GEN BY PCR, NASAL: MRSA by PCR Next Gen: NOT DETECTED

## 2021-06-28 LAB — CBG MONITORING, ED
Glucose-Capillary: 140 mg/dL — ABNORMAL HIGH (ref 70–99)
Glucose-Capillary: 157 mg/dL — ABNORMAL HIGH (ref 70–99)
Glucose-Capillary: 148 mg/dL — ABNORMAL HIGH (ref 70–99)

## 2021-06-28 LAB — GLUCOSE, CAPILLARY
Glucose-Capillary: 139 mg/dL — ABNORMAL HIGH (ref 70–99)
Glucose-Capillary: 174 mg/dL — ABNORMAL HIGH (ref 70–99)
Glucose-Capillary: 201 mg/dL — ABNORMAL HIGH (ref 70–99)

## 2021-06-28 LAB — CBC
HCT: 33.6 % — ABNORMAL LOW (ref 36.0–46.0)
Hemoglobin: 10.7 g/dL — ABNORMAL LOW (ref 12.0–15.0)
MCH: 27.2 pg (ref 26.0–34.0)
MCHC: 31.8 g/dL (ref 30.0–36.0)
MCV: 85.5 fL (ref 80.0–100.0)
Platelets: 166 10*3/uL (ref 150–400)
RBC: 3.93 MIL/uL (ref 3.87–5.11)
RDW: 14.6 % (ref 11.5–15.5)
WBC: 8.7 10*3/uL (ref 4.0–10.5)
nRBC: 0 % (ref 0.0–0.2)

## 2021-06-28 LAB — BASIC METABOLIC PANEL
Anion gap: 11 (ref 5–15)
BUN: 27 mg/dL — ABNORMAL HIGH (ref 8–23)
CO2: 25 mmol/L (ref 22–32)
Calcium: 9 mg/dL (ref 8.9–10.3)
Chloride: 102 mmol/L (ref 98–111)
Creatinine, Ser: 0.97 mg/dL (ref 0.44–1.00)
GFR, Estimated: 60 mL/min — ABNORMAL LOW (ref >60)
Glucose, Bld: 172 mg/dL — ABNORMAL HIGH (ref 70–99)
Potassium: 3.5 mmol/L (ref 3.5–5.1)
Sodium: 138 mmol/L (ref 135–145)

## 2021-06-28 LAB — PROCALCITONIN: Procalcitonin: 0.16 ng/mL

## 2021-06-28 LAB — PHOSPHORUS: Phosphorus: 4.4 mg/dL (ref 2.5–4.6)

## 2021-06-28 LAB — MAGNESIUM: Magnesium: 2 mg/dL (ref 1.7–2.4)

## 2021-06-28 MED ORDER — ORAL CARE MOUTH RINSE
15.0000 mL | OROMUCOSAL | Status: DC
  Administered 2021-06-28 – 2021-07-06 (×49): 15 mL via OROMUCOSAL

## 2021-06-28 MED ORDER — CHLORHEXIDINE GLUCONATE 0.12% ORAL RINSE (MEDLINE KIT)
15.0000 mL | Freq: Two times a day (BID) | OROMUCOSAL | Status: DC
  Administered 2021-06-29 – 2021-07-06 (×11): 15 mL via OROMUCOSAL

## 2021-06-28 MED ORDER — CHLORHEXIDINE GLUCONATE CLOTH 2 % EX PADS
6.0000 | MEDICATED_PAD | Freq: Every day | CUTANEOUS | Status: DC
  Administered 2021-06-28 – 2021-07-04 (×6): 6 via TOPICAL

## 2021-06-28 MED ORDER — FREE WATER
120.0000 mL | Freq: Four times a day (QID) | Status: DC
  Administered 2021-06-28 – 2021-07-06 (×32): 120 mL

## 2021-06-28 MED ORDER — PROSOURCE TF PO LIQD
45.0000 mL | Freq: Every day | ORAL | Status: DC
  Administered 2021-06-29 – 2021-07-06 (×8): 45 mL
  Filled 2021-06-28 (×8): qty 45

## 2021-06-28 MED ORDER — MELATONIN 5 MG PO TABS
10.0000 mg | ORAL_TABLET | Freq: Every day | ORAL | Status: DC
  Administered 2021-06-28: 10 mg via ORAL
  Filled 2021-06-28: qty 2

## 2021-06-28 MED ORDER — KATE FARMS STANDARD 1.4 PO LIQD
325.0000 mL | Freq: Four times a day (QID) | ORAL | Status: DC
  Administered 2021-06-28 – 2021-07-06 (×32): 325 mL
  Filled 2021-06-28: qty 325

## 2021-06-28 NOTE — ED Notes (Signed)
Pt's briefs wet. Pt had smear of BM on buttocks. Pericare provided and pt's briefs changed. ?

## 2021-06-28 NOTE — Progress Notes (Signed)
PHARMACY CONSULT NOTE - FOLLOW UP ? ?Pharmacy Consult for Electrolyte Monitoring and Replacement  ? ?Recent Labs: ?Potassium (mmol/L)  ?Date Value  ?06/28/2021 3.5  ?12/17/2011 4.1  ? ?Magnesium (mg/dL)  ?Date Value  ?06/28/2021 2.0  ?12/17/2011 2.2  ? ?Calcium (mg/dL)  ?Date Value  ?06/28/2021 9.0  ? ?Calcium, Total (mg/dL)  ?Date Value  ?12/17/2011 8.8  ? ?Albumin (g/dL)  ?Date Value  ?06/26/2021 3.1 (L)  ?08/28/2016 4.3  ?12/16/2011 4.1  ? ?Phosphorus (mg/dL)  ?Date Value  ?06/28/2021 4.4  ?12/16/2011 2.2 (L)  ? ?Sodium (mmol/L)  ?Date Value  ?06/28/2021 138  ?08/28/2016 143  ?12/17/2011 139  ? ? ? ?Assessment: ?81 year old female admitted with acute respiratory failure 2/2 sepsis, UTI growing Klebsiella pneumoniae. PMH tracheal stenosis requiring chronic tracheostomy, breast cancer (2002), T2DM, OA, PEG tube.  ? ?On feeds QID, free water 120 mL QID ?On ceftriaxone 1 gram every 24 hours ? ?Goal of Therapy:  ?Electrolytes within normal limits ? ?Plan:  ?No replacement warranted ?Follow up labs in AM ? ?Wynelle Cleveland, PharmD ?Pharmacy Resident  ?06/28/2021 ?2:42 PM ? ?

## 2021-06-28 NOTE — ED Notes (Signed)
Respiratory at bedside taking pt off vent at pt request. Pt has breakfast set up at bedside. Hand hygiene provided. Urine canister emptied. Denies further needs.  ?

## 2021-06-28 NOTE — Plan of Care (Signed)

## 2021-06-28 NOTE — Progress Notes (Signed)
? ? ?NAME:  Angela David, MRN:  [id number], DOB:  04/29/1940, LOS: 1 ?ADMISSION DATE:  06/26/2021, CONSULTATION DATE:  06/27/2021 ?REFERRING MD:  Pryor Curia MD  CHIEF COMPLAINT: Unresponsiveness  ? ? 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 ? ?Patient was recently discharged from the hospital on 03/06/2021 to [facility]  following prolonged admission for Acute on Chronic Hypoxic & Respiratory Failure Secondary to suspected aspiration pneumonia, ? healthcare associated pneumonia (? COLONIZATION) and influenza A viral infection and sepsis due to aspiration pneumonia and suspected abdominal abscess. Per ED notes, patient was discharged from [facility] yesterday evening and was placed on new home vent for approximately 3 hours when she developed respiratory distress. Per patient's husband, altered, diaphoretic and unresponsive therefore EMS was called. ? ?ED Course: In the emergency department, the temperature was 37.1?C, the heart rate 120 beats/minute, the blood pressure  109/47 mm Hg, the respiratory rate 21 breaths/minute, and the oxygen saturation 94 % on TC '@28'$ %.  Stat chest x-ray was obtained and showed mild right basilar atelectasis CT angio negative for acute pulmonary embolism.  CT of the abdomen and pelvis with no acute abnormality. ?Pertinent Labs in Red/Diagnostics Findings: ?Na+/ K+: 8/3.9 ?Glucose: 235 ?BUN/Cr.:  33/0.86 ?  ?WBC/ TMAX: 13.5/ afebrile ?PCT: negative <0.10 ?Lactic acid: 2.0 ?COVID PCR: Negative ?Arterial Blood Gas result:  pO2 62; pCO2 47; pH 7.49;  HCO3 35.8, %O2 Sat 95.7.  ? ?Patient was placed on the ventilator with a tidal volume of 450 and rate of 24.  She  received IV fluids and was started on IV antibiotics for possible urosepsis.  PCCM consulted for vent management ? ?Past Medical History  ? 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 ?Significant Hospital Events   ?5/2: Admitted to the ICU with possible sepsis secondary to UTI and chronic vent management ?5/3: Weaned off vent to trach collar.  Eating and drinking.  Will trial home ventilator/trilogy at night.  Urine culture positive for Klebsiella pneumoniae, sensitivities pending.  [facility] evaluating home resources in anticipation of discharge soon ? ?Consults:  ?PCCM ? ?Procedures:  ?None ? ?Significant Diagnostic Tests:  ?5/2: CTA abdomen and pelvis> no abnormality ?5/2: CTA Chest> no evidence of acute pulmonary embolism ? ?Micro Data:  ?5/2: Blood culture x2> ?5/2: Urine Culture> Klebsiella pneumoniae ?5/2: MRSA PCR>>  ? ?Antimicrobials:  ?Ceftriaxone 5/2> ? ?OBJECTIVE  ?Blood pressure (!) 117/52, pulse (!) 110, temperature 98.4 ?F (36.9 ?C), temperature source Oral, resp. rate (!) 36, height '5\' 4"'$  (1.626 m), weight 68.5 kg, SpO2 96 %. ?   ?Vent  Mode: AC ?FiO2 (%):  [24 %] 24 % ?Set Rate:  [18 bmp] 18 bmp ?Vt Set:  [450 mL] 450 mL ?PEEP:  [5 cmH20] 5 cmH20  ? ?Intake/Output Summary (Last 24 hours) at 06/28/2021 0904 ?Last data filed at 06/28/2021 0750 ?Gross per 24 hour  ?Intake 405 ml  ?Output 700 ml  ?Net -295 ml  ? ? ?Filed Weights  ?  06/26/21 2220  ?Weight: 68.5 kg  ? ?Physical Examination  ?GENERAL: Chronically ill appearing patient sitting in bed, weaned to trach collar, in no acute distress ?EYES: Pupils equal, round, reactive to light and accommodation. No scleral icterus. Extraocular muscles intact.  ?HEENT: Head atraumatic, normocephalic. Oropharynx and nasopharynx clear.  ?NECK:  Supple, no jugular venous distention. No thyroid enlargement, no tenderness.  ?LUNGS: Normal breath sounds bilaterally, no wheezing, rales,rhonchi or crepitation. No use of accessory muscles of respiration.  ?CARDIOVASCULAR: Tachycardia, regular rhythm S1, S2 normal. No murmurs, rubs, or gallops.  ?ABDOMEN: Soft, nontender, nondistended. Bowel sounds present. No organomegaly or mass. PEG in place.  ?EXTREMITIES: No pedal edema, cyanosis, or clubbing.  ?NEUROLOGIC: Cranial nerves II through XII are intact.  Muscle strength not checked. Sensation intact. Gait not checked.  ?PSYCHIATRIC: The patient is alert and, orientation difficult to assess due to trach, nods appropriately to questions ?SKIN: No obvious rash, lesion, or ulcer.  ? ?Labs/imaging that I havepersonally reviewed  ?(right click and "Reselect all SmartList Selections" daily)  ? ?  ?Labs   ?CBC: ?Recent Labs  ?Lab 06/26/21 ?2209 06/27/21 ?0440 06/28/21 ?2505  ?WBC 13.5* 8.4 8.7  ?NEUTROABS 11.5*  --   --   ?HGB 12.4 10.1* 10.7*  ?HCT 40.2 32.4* 33.6*  ?MCV 88.5 87.1 85.5  ?PLT 203 169 166  ? ? ? ?Basic Metabolic Panel: ?Recent Labs  ?Lab 06/26/21 ?2209 06/27/21 ?0440 06/27/21 ?3976 06/28/21 ?7341  ?NA 138 137 138 138  ?K 3.9 3.9 3.8 3.5  ?CL 97* 99 103 102  ?CO2 33* '27 28 25  '$ ?GLUCOSE 235* 94 88 172*  ?BUN 33* 29* 29* 27*  ?CREATININE 0.86 0.79 0.76 0.97  ?CALCIUM 9.6 8.4* 9.1 9.0  ?MG 2.0 1.7  --  2.0  ?PHOS  --  2.2*  --  4.4  ? ? ?GFR: ?Estimated Creatinine Clearance: 45.4 mL/min (by C-G formula based on SCr of 0.97 mg/dL). ?Recent Labs  ?Lab 06/26/21 ?2209 06/27/21 ?0056 06/27/21 ?0440  06/28/21 ?9379  ?PROCALCITON <0.10  --  <0.10 0.16  ?WBC 13.5*  --  8.4 8.7  ?LATICACIDVEN 2.0* 1.7  --   --   ? ? ? ?Liver Function Tests: ?Recent Labs  ?Lab 06/26/21 ?2209  ?AST 25  ?ALT 12  ?ALKPHOS 61  ?BILITOT 0.4  ?PROT 7.6  ?ALBUMIN 3.1*  ? ? ?No results for input(s): LIPASE, AMYLASE in the last 168 hours. ?No results for input(s): AMMONIA in the last 168 hours. ? ?ABG ?   ?Component Value Date/Time  ? PHART 7.49 (H) 06/26/2021 2205  ? PCO2ART 47 06/26/2021 2205  ? PO2ART 62 (L) 06/26/2021 2205  ? HCO3 35.8 (H) 06/26/2021 2205  ? ACIDBASEDEF 0.9 01/19/2020 1024  ? O2SAT 95.7 06/26/2021 2205  ? ?  ? ?Coagulation Profile: ?No results for input(s): INR, PROTIME in the last 168 hours. ? ?Cardiac Enzymes: ?No results for input(s): CKTOTAL, CKMB, CKMBINDEX, TROPONINI in the last 168 hours. ? ?HbA1C: ?Hgb A1c MFr Bld  ?Date/Time Value Ref Range Status  ?06/27/2021 04:40 AM 6.1 (H) 4.8 - 5.6 % Final  ?  Comment:  ?  (NOTE) ?Pre diabetes:          5.7%-6.4% ? ?Diabetes:              >6.4% ? ?Glycemic control for   <7.0% ?adults with diabetes ?  ?01/20/2020 07:19 AM 6.3 (H) 4.8 - 5.6 % Final  ?  Comment:  ?  (NOTE) ?Pre diabetes:          5.7%-6.4% ? ?Diabetes:              >6.4% ? ?Glycemic control for   <7.0% ?adults with diabetes ?  ? ? ?CBG: ?Recent Labs  ?Lab 06/27/21 ?1641 06/27/21 ?2033 06/27/21 ?2346 06/28/21 ?0403 06/28/21 ?0719  ?GLUCAP 108* 116* 153* 140* 157*  ? ? ?Review of Systems:   ?Positives in BOLD: Currently denies all complaints ?Gen: Denies fever, chills, weight change, fatigue, night sweats ?HEENT: Denies blurred vision, double vision, hearing loss, tinnitus, sinus congestion, rhinorrhea, sore throat, neck stiffness, dysphagia ?PULM: Denies shortness of breath, cough, sputum production, hemoptysis, wheezing ?CV: Denies chest pain, edema, orthopnea, paroxysmal nocturnal dyspnea, palpitations ?GI: Denies abdominal pain, nausea, vomiting, diarrhea, hematochezia, melena, constipation, change in bowel  habits ?GU: Denies dysuria, hematuria, polyuria, oliguria, urethral discharge ?Endocrine: Denies hot or cold intolerance, polyuria, polyphagia or appetite change ?Derm: Denies rash, dry skin, scaling or peeling skin change

## 2021-06-28 NOTE — TOC Initial Note (Signed)
Transition of Care (TOC) - Initial/Assessment Note  ? ? ?Patient Details  ?Name: Angela David ?MRN: [id number] ?Date of Birth: 04/08/1940 ? ?Transition of Care (TOC) CM/SW Contact:    ?Shelbie Hutching, RN ?Phone Number: ?06/28/2021, 12:29 PM ? ?Clinical Narrative:                 ?Patient admitted to the hospital with UTI and acute respiratory distress.  Patient has vocal cord paralysis and has a chronic trach and peg.  Patient was discharged from [facility] on 5/1. ?Patient has a home vent, NIV, oxygen, and all trach supplies from [employer].  Patient's oxygen was delivered on 5/1 10L concentrator.  Patient is on the vent at night and trach collar during the day.  She uses a passy muir valve and is able to eat, she also does tube feeds through peg.  [facility] Respiratory Therapist Arbutus [phone]. ? ?Plan for discharge home possibly tomorrow.  Patient will need EMS transport.  [facility] should have set up [facility] services but husband unsure of agency, RNCM will find out and notify of admission.   ? ?Expected Discharge Plan: [place] ?Barriers to Discharge: Continued Medical Work up ? ? ?Patient Goals and CMS Choice ?Patient states their goals for this hospitalization and ongoing recovery are:: To get back home ?CMS Medicare.gov Compare Post Acute Care list provided to:: Patient ?Choice offered to / list presented to : Patient, Spouse ? ?Expected Discharge Plan and Services ?Expected Discharge Plan: [place] ?  ?Discharge Planning Services: CM Consult ?Post Acute Care Choice: Home Health, Resumption of Svcs/PTA Provider ?Living arrangements for the past 2 months: [place] ?                ?DME Arranged: N/A ?DME Agency: NA ?  ?  ?  ?HH Arranged: RN, PT, OT ?  ?  ?  ?  ? ?Prior Living Arrangements/Services ?Living arrangements for the past 2 months: [place] ?Lives with:: Spouse ?Patient language and need for interpreter reviewed:: Yes ?Do you feel safe going back to  the place where you live?: Yes      ?Need for Family Participation in Patient Care: Yes (Comment) ?Care giver support system in place?: Yes (comment) ?Current home services: DME (oxygen, NIV, trach supplies, walker, bedside commode) ?Criminal Activity/Legal Involvement Pertinent to Current Situation/Hospitalization: No - Comment as needed ? ?Activities of Daily Living ?Home Assistive Devices/Equipment: Wheelchair, Environmental consultant (specify type), Vent/Trach supplies, Eyeglasses ?ADL Screening (condition at time of admission) ?Patient's cognitive ability adequate to safely complete daily activities?: Yes ?Is the patient deaf or have difficulty hearing?: No ?Does the patient have difficulty seeing, even when wearing glasses/contacts?: No ?Does the patient have difficulty concentrating, remembering, or making decisions?: No ?Patient able to express need for assistance with ADLs?: Yes ?Does the patient have difficulty dressing or bathing?: Yes ?Independently performs ADLs?: No ?Communication: Independent ?Dressing (OT): Needs assistance ?Is this a change from baseline?: Pre-admission baseline ?Grooming: Needs assistance ?Is this a change from baseline?: Pre-admission baseline ?Feeding: Needs assistance ?Is this a change from baseline?: Pre-admission baseline ?Bathing: Needs assistance ?Is this a change from baseline?: Pre-admission baseline ?Toileting: Needs assistance ?Is this a change from baseline?: Pre-admission baseline ?In/Out Bed: Needs assistance ?Is this a change from baseline?: Pre-admission baseline ?Walks in Home: Dependent ?Is this a change from baseline?: Pre-admission baseline ?Does the patient have difficulty walking or climbing stairs?: Yes ?Weakness of Legs: Both ?Weakness of Arms/Hands:  None ? ?Permission Sought/Granted ?Permission sought to share information with : Case Manager, Family Supports, Other (comment) ?Permission granted to share information with : Yes, Verbal Permission Granted ? Share Information  with NAME: Kristiane Morsch ? Permission granted to share info w AGENCY: [employer] ? Permission granted to share info w Relationship: spouse ? Permission granted to share info w Contact Information: [phone] ? ?Emotional Assessment ?Appearance:: Appears stated age ?Attitude/Demeanor/Rapport: Engaged ?Affect (typically observed): Accepting ?Orientation: : Oriented to Self, Oriented to Place, Oriented to  Time, Oriented to Situation ?Alcohol / Substance Use: Not Applicable ?Psych Involvement: No (comment) ? ?Admission diagnosis:  Diaphoresis [R61] ?Unresponsive episode [R41.89] ?Acute UTI [N39.0] ?Hypotension, unspecified hypotension type [I95.9] ?Acute on chronic respiratory failure with hypoxia and hypercapnia (HCC) [J96.21, J96.22] ?Patient Active Problem List  ? Diagnosis Date Noted  ? Acute on chronic respiratory failure with hypoxia and hypercapnia ([place]) 06/27/2021  ? 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 Acute pulmonary edema (HCC)   ? Somnolence   ? Sepsis ([place]) 02/20/2021  ? Abdominal wall abscess   ? Chronic respiratory failure with hypoxia ([place]) 03/29/2020  ? Chronic diastolic CHF (congestive heart failure) ([place]) 01/31/2020  ? Uncontrolled type 2 diabetes mellitus with hyperglycemia ([place]) 01/31/2020  ? AF (paroxysmal atrial fibrillation) ([place]) 01/31/2020  ? Aspiration pneumonia of both lower lobes due to gastric secretions ([place]) 01/28/2020  ? Acute metabolic encephalopathy 03/06/3233  ? Acute respiratory failure ([place]) 01/23/2020  ? Left lower lobe pneumonia 01/16/2020  ? 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 Weakness   ? Hypoxia   ? Acute respiratory failure with hypoxia and hypercapnia ([place]) 03/21/2019  ? Hypotension 03/21/2019  ? Acute encephalopathy 03/21/2019  ? Multiple fractures of ribs, right side, initial encounter for closed fracture 12/25/2018  ? Colon polyp 11/06/2018  ?  Esophageal dysphagia   ? 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   ? Stricture and stenosis of esophagus   ? Elevated CEA 09/02/2018  ? Goals of care, counseling/discussion 09/02/2018  ? Polyp of transverse colon 09/02/2018  ? Foreign body in right foot 05/26/2018  ? History of tracheostomy 04/30/2018  ? Moderate episode of recurrent major depressive disorder ([place]) 04/03/2018  ? Poor balance 04/03/2018  ? Shoulder lesion, right 02/28/2017  ? Cervical radiculopathy 09/28/2016  ? Peripheral neuropathy 08/28/2016  ? Diabetic peripheral neuropathy ([place]) 08/28/2016  ? Leg weakness, bilateral 08/13/2016  ? Gait abnormality 08/13/2016  ? Carcinoma of overlapping sites of right breast in female, estrogen receptor negative ([place]) 12/27/2015  ? Malignant neoplasm of breast ([place]) 03/28/2015  ? 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 Hypercholesterolemia 03/28/2015  ? Recurrent major depressive disorder, in partial remission ([place]) 03/01/2015  ? 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 Cephalalgia 09/15/2014  ? Headache due to trauma 09/15/2014  ? Osteopenia 01/07/2014  ? Age-related osteoporosis without current pathological fracture 01/07/2014  ? History of colonic polyps 12/14/2013  ? History of breast cancer 11/26/2012  ? H/O malignant neoplasm of breast 11/26/2012  ? 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 Tracheostomy present ([place]) 12/06/2011  ? Calcification of bronchus or trachea 01/16/2011  ? 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 Anxiety state 12/19/2010  ? Blood in sputum 12/19/2010  ? HLD (hyperlipidemia) 12/19/2010  ? Type 2 diabetes mellitus ([place]) 12/19/2010  ? ?PCP:  System, Provider Not In ?Pharmacy:   ?[place], [place] ?[address] ?[place] [address] ?Phone: [phone] Fax: [phone] ? ?[place] ?[place] [address] ?Phone: [phone] Fax: [phone] ? ? ? ? ?Social  Determinants of Health (SDOH) Interventions ?  ? ?Readmission Risk Interventions ? ?  02/23/2021  ? 12:23 PM  ?Readmission Risk Prevention Plan  ?Transportation Screening Complete  ?PCP or Specialist App

## 2021-06-28 NOTE — Progress Notes (Signed)
? ?  Call to me at [facility] ICU from [facility] PCCM MD ? ?Chronic trach/vent ? ?Plan ? - patient to stay at [facility] ICU ?- no need for transfer to [place] ? ? ? ? ?SIGNATURE  ? ? ?Dr. Brand Males, M.D., F.C.C.P,  ?Pulmonary and Critical Care Medicine ?Staff Physician, [place] ?Center Director - Interstitial Lung Disease  Program  ?Medical Director - [employer] ICU ?Pulmonary [place] [facility] ?[place], [place], [address] ? ?NPI Number:  NPI #[id number] ?DEA Number: [id number] ? ?Pager: [phone], If no answer  -> Check AMION or Try [phone] ?Telephone (clinical office): [phone] ?Telephone (research): [phone] ? ?8:38 AM ?06/28/2021 ? ?

## 2021-06-28 NOTE — ED Notes (Signed)
Pt repositioned in bed, placed in dry brief. C/o pain to bilateral legs, requesting Tylenol. ?

## 2021-06-28 NOTE — Evaluation (Signed)
Clinical/Bedside Swallow Evaluation ?Patient Details  ?Name: Angela David ?MRN: [id number] ?Date of Birth: 04-Apr-1940 ? ?Today's Date: 06/28/2021 ?Time: SLP Start Time (ACUTE ONLY): 6010 SLP Stop Time (ACUTE ONLY): 1400 ?SLP Time Calculation (min) (ACUTE ONLY): 105 min ? ?Past Medical History:  ?Past Medical History:  ?Diagnosis Date  ? Breast cancer, right ([place]) 2002  ? Tx'd with chemotherapy and XRT  ? Chronic respiratory failure ([place])   ? s/p tracheostomy in 2012; on ventilatory support at hs  ? Diabetic peripheral neuropathy ([place]) 08/28/2016  ? GERD (gastroesophageal reflux disease)   ? Glaucoma   ? High cholesterol   ? History of colon polyps   ? Osteoarthritis   ? Personal history of chemotherapy 2002  ? BREAST CA  ? Personal history of radiation therapy 2002  ? BREAST CA  ? S/P percutaneous endoscopic gastrostomy (PEG) tube placement ([place])   ? T2DM (type 2 diabetes mellitus) ([place])   ? 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 Ventral hernia   ? Vocal cord paralysis 2012  ? ?Past Surgical History:  ?Past Surgical History:  ?Procedure Laterality Date  ? ABDOMINAL HYSTERECTOMY    ? BREAST BIOPSY Right 2008  ? neg  ? BREAST BIOPSY Right 06/30/2020  ? "Q" clip-path pending  ? BREAST EXCISIONAL BIOPSY Right 2002  ? positive  ? BREAST LUMPECTOMY Right 2002  ? chemo and radiation  ? BREAST LUMPECTOMY WITH RADIOFREQUENCY TAG IDENTIFICATION Right 10/18/2020  ? Procedure: BREAST LUMPECTOMY WITH RADIOFREQUENCY TAG IDENTIFICATION;  Surgeon: Jules Husbands, MD;  Location: [facility] ORS;  Service: General;  Laterality: Right;  ? BREAST SURGERY Right 2002  ? LUMPECTOMY, radiation, chemotherapy  ? CATARACT EXTRACTION, BILATERAL    ? CHOLECYSTECTOMY N/A 12/10/2017  ? Procedure: LAPAROSCOPIC CHOLECYSTECTOMY;  Surgeon: Benjamine Sprague, DO;  Location: [facility] ORS;  Service: General;  Laterality: N/A;  ? COLON RESECTION Right 11/06/2018  ? Procedure: HAND ASSISTED LAPAROSCOPIC COLON RESECTION, RIGHT;  Surgeon: Jules Husbands, MD;  Location:  [facility] ORS;  Service: General;  Laterality: Right;  ? COLONOSCOPY  2009,12/30/2013  ? COLONOSCOPY WITH PROPOFOL N/A 04/16/2018  ? Tubulovillous polyp proximal transverse colon, large sessile polyp, proximal transverse colon;  Surgeon: Robert Bellow, MD;  Location: [facility] ENDOSCOPY;  Service: Endoscopy;  Laterality: N/A;  ? ESOPHAGOGASTRODUODENOSCOPY (EGD) WITH PROPOFOL N/A 10/14/2018  ? Procedure: ESOPHAGOGASTRODUODENOSCOPY (EGD) WITH PROPOFOL;  Surgeon: Lucilla Lame, MD;  Location: [facility] ENDOSCOPY;  Service: Endoscopy;  Laterality: N/A;  ? TRACHEAL SURGERY  2013  ? TRACHEOSTOMY N/A   ? Dr Kathyrn Sheriff  ? XI ROBOTIC ASSISTED VENTRAL HERNIA N/A 10/18/2020  ? Procedure: XI ROBOTIC ASSISTED VENTRAL HERNIA;  Surgeon: Jules Husbands, MD;  Location: [facility] ORS;  Service: General;  Laterality: N/A;  ? ?HPI:  ?Pt is a 81 y.o with significant PMH of multiple medical dxs including vocal cord paralysis s/p chronic trach and PEG, dysphagia, remote breast cancer in 2002 s/p chemo and radiation, DM2, ventral hernia s/p robotic incisional hernia repair and right upper outer quadrant mass s/p RFA guided right lumpectomy (09/2020), peripheral neuropathy, GERD, who presented to the ED with chief complaints of unresponsiveness; UTI.   ?Pt has been seen by ST services in previous admissions in 2021. She was recently being seen at [facility] by [facility] services (02/2021-05/2021) and was discharged on PEG TFs; no oral diet per discussion w/ pt/Husband. Pt is on Vent support at night(cuffed trach) and on TC O2 support during the day. She has been able to wear the PMV in  the past for verbal communication w/ others. ?  ?  ?Assessment / Plan / Recommendation  ?Clinical Impression ?  Pt seen for both BSE and PMV evaluations today -  assessment of PMV wear/use then toleration of po's/oral diet. Pt was placed on a Regular diet w/ Honey consistency liquids when admitted to the ED 06/26/2021. Pt has a Baseline of Dysphagia w/ chronic tracheostomy and PEG --  she was just discharged home on 06/26/2021 on PEG TFs (only) from [facility] w/ no oral diet recommended, upon questioning pt and Husband today. Pt had been admitted to [facility] for care/rehab from 02/2021-06/26/2021. She received ST services addressing both PMV use/wear and dysphagia tx which appears to have been limited to ice chips for Pleasure. Pt endorsed she did have a "swallow test" while in tx there.  ? ?PMV Eval:  ?Pt is currently on TC; she is on nocturnal vent support. Pt was alert, attempted both mouth and talking upon entering room for session. She followed through w/ general instructions/precautions w/ cues.  ?OF NOTE: Pt required suctioning and change of inner cannula -- noted thick secretions on distal cannula. ?  ?Explained the use and wear of the PMV to pt; trach and stoma area inspected; ensured Cuff was deflated b/f placing the PMV. (Cuffed trach d/t nocturnal vent need) Pt's respiratory effort appeared min increased w/ any exertion including Talking and Moving to sit upright in bed. RR: 19-21, O2 sats 95-96%, HR 109-111. FiO2 28% at 5L per chart. Cuff deflated at baseline. Verbalizations were c/b decreased breath support/effort for speech, Dysphonia. Vocal quality revealed min gravely, strained quality. Volume was adequate when breath supported. Pt endorsed this was her "typical" speaking -- suspect impact from Vocal Cord Paralysis (per chart dx). Encouraged pt to focus on slowing down during verbal responses and to use breath support calmly to support speech/volume -- 1-2 words at atime. PMV placement was tolerated during sessions w/out noted O2 desaturation, or significant change in RR/HR from her baseline. No overt discomfort noted in her respiratory effort -- pt requested to leave it on after sessions. No effortful respirations nor clavicular breathing noted; no overt use of accessary muscles or distress was noted in her breathing pattern. PMV was allowed to remain on as pt consumed  po's and after w/ Husband present in room. ?  ?Pt appears to adequately tolerate PMV placement w/out overt, gross respiratory discomfort or distress; ANS remained adequately stable at her Baseline during wear/use. Suspect Shiley trach size, #6, is beneficial for comfortable PMV wear/use.  Much education was given on PMV use/wear, MUST have Cuff deflation for PMV wear, checking and removing the air from the balloon b/f placing, placing/removing the PMV, and care of the PMV. Discussed that it must NOT be worn when sleeping. Encouraged Rest Breaks at times during the day. Precautions posted at bedside and in chart.  ?Pt remains w/ a Cuffed trach d/t nocturnal vent. Sticker placed on Cuff line, and in room. NSG made aware. MD updated. ST services will monitor for any further needs while admitted. ?  ?  ?DYSPHAGIA - BSE:  ?Pt seen for BSE. She was able to wear the PMV comfortably for verbal communication and for po intake. See session noted above.  Noted oral diet order in chart. Pt arrived to the CCU w/ a Lunch tray on her lap but had not eaten any yet.  ?Pt explained general aspiration precautions and agreed verbally to the need for following them especially sitting upright for  all oral intake, No Talking w/ food and drink in mouth, and wearing PMV for ALL oral intake. Pt assisted w/ sitting up and using multiple pillows behind him for a forward position. Noted min increased RR rate and effort d/t the moving in bed. After Rest Break, she was then given trials of ice chips, Nectar and Honey liquids, purees and soft solids. Overt clinical s/s of aspiration and uncoordinated, effortful swallowing were noted w/ thickened liquids and food consistencies; no O2 desaturation but respiratory and HR status' were slightly increased w/ the overall effort, Pulmonary effort. Rest Breaks were given. Vocal quality clear b/t trials. O2 sats 95%. Pt held Cup when drinking and supported feeding self w/ this Clinician. Oral phase appeared  [facility] for bolus management and timely A-P transfer for swallowing -- there was a significant delay in completing the pharyngeal swallow at this juncture. When engaged, the pharyngeal swallowing appeared

## 2021-06-28 NOTE — ED Notes (Signed)
Pt repositioned in bed; HOB adjusted. Pt assisted to find remote to tv in bed. Pt denies any other needs currently.  ?

## 2021-06-28 NOTE — ED Notes (Signed)
Pt confirms uses speaking valve at home while eating to help with swallowing. Speech therapy team member states she will talk with doctor to get this ordered and set up for pt.  ?

## 2021-06-28 NOTE — Progress Notes (Signed)
Admission profile updated. ?

## 2021-06-28 NOTE — ED Notes (Signed)
Multiple attempts by multiple staff members to obtain labs on pt. IV does not draw back. Lab contacted to obtain ?

## 2021-06-28 NOTE — ED Notes (Signed)
Angela David, ICU NP at bedside ?

## 2021-06-28 NOTE — Progress Notes (Signed)
Initial Nutrition Assessment ? ?DOCUMENTATION CODES:  ? ?Not applicable ? ?INTERVENTION:  ? ?Dillard [place] Standard 1.4- Give 1 carton 4 times daily via tube- Flush with 61m of water before and after each feed ? ?Pro-Source 454mdaily via tube, provides 40kcal and 11g of protein per serving  ? ?Regimen provides 1860kcal/day, 91g/day protein and [id number] of free water ? ?Pt at moderate refeed risk; recommend monitor potassium, magnesium and phosphorus labs daily until stable ? ?NUTRITION DIAGNOSIS:  ? ?Inadequate oral intake related to dysphagia as evidenced by NPO status (pt with chronic G-tube). ? ?GOAL:  ? ?Patient will meet greater than or equal to 90% of their needs ? ?MONITOR:  ? ?Labs, Weight trends, TF tolerance, Skin, I & O's, Diet advancement ? ?REASON FOR ASSESSMENT:  ? ?Consult ?Assessment of nutrition requirement/status ? ?ASSESSMENT:  ? ?[age]ar old female with PMHx of breast cancer s/p lumpectomy, right partial mastectomy and chemo/XRT, DM, diabetic peripheral neuropathy, GERD, vocal cord paralysis, tracheal stenosis requiring chronic tracheostomy, aspiration PNA, chronic G-tube (placed surgically 01/29/2020), s/p hand-assisted laparoscopic right hemicolectomy by Dr. PabDahlia Byes 11/06/2018, s/p robotic assisted incisional ventral hernia repair on 10/18/2020 and recent admission for flu A who is now admitted with aspiraiton, UTI and sepsis. ? ?Met with pt and pt's husband in room today. Pt is well known to this RD from a recent previous admission. Pt with chronic G-tube. Pt reports that she was [facility] [place] [facility] reports she started bolus feeds once she returned home and reports that she tolerated well; pt receives 4 cartons daily. Pt is NPO at baseline [facility] on a heart healthy diet in the ED but pt seen by SLP today and was made NPO.  Plan is for possible MBSS tomorrow to hopefully initiate pt on a diet  for pleasure. Will resume pt's home tube feed regimen in hospital. Per chart, pt appears weight stable since her last admission.  ? ?Medications reviewed and include: lovenox, insulin, protonix, ceftriaxone ? ?Labs reviewed: K 3.5 wnl, BUN 27(H), P 4.4 wnl, Mg 2.0 wnl ?Hgb 10.7(L), Hct 33.6(L) ?Cbgs- 148, 157, 140 x 24 hrs ?AIC 6.1(H)- 5/2 ? ?NUTRITION - FOCUSED PHYSICAL EXAM: ? ?Flowsheet Row Most Recent Value  ?Orbital Region No depletion  ?Upper Arm Region No depletion  ?Thoracic and Lumbar Region No depletion  ?Buccal Region No depletion  ?Temple Region Mild depletion  ?Clavicle Bone Region Mild depletion  ?Clavicle and Acromion Bone Region Mild depletion  ?Scapular Bone Region No depletion  ?Dorsal Hand Moderate depletion  ?Patellar Region No depletion  ?Anterior Thigh Region No depletion  ?Posterior Calf Region No depletion  ?Edema (RD Assessment) None  ?Hair Reviewed  ?Eyes Reviewed  ?Mouth Reviewed  ?Skin Reviewed  ?Nails Reviewed  ? ?Diet Order:   ?Diet Order   ? ?       ?  Diet NPO time specified  Diet effective now       ?  ? ?  ?  ? ?  ? ?EDUCATION NEEDS:  ? ?Education needs have been addressed ? ?Skin:  Skin Assessment: Reviewed RN Assessment ? ?Last BM:  pta ? ?Height:  ? ?Ht Readings from Last 1 Encounters:  ?06/28/21 _0  (1.626 m)  ? ? ?Weight:  ? ?Wt Readings from Last 1 Encounters:  ?06/28/21 74.9 kg  ? ? ?Ideal Body Weight:  54.5 kg ? ?BMI:  Body mass index  is 28.34 kg/m?. ? ?Estimated Nutritional Needs:  ? ?Kcal:  1700-1900kcal/day ? ?Protein:  85-95g/day ? ?Fluid:  1.4-1.6L/day ? ?Koleen Distance MS, RD, LDN ?Please refer to AMION for RD and/or RD on-call/weekend/after hours pager ? ?

## 2021-06-28 NOTE — Consult Note (Addendum)
? ?                                                                                ?Consultation Note ?Date: 06/28/2021  ? ?Patient Name: Angela David  ?DOB: 08-22-1940  MRN: [id number]  Age / Sex: 81 y.o., female  ?PCP: System, Provider Not In ?Referring Physician: Tyler Pita, MD ? ?Reason for Consultation: Establishing goals of care ? ?HPI/Patient Profile: 81 y.o with significant PMH of remote breast cancer in 2002 s/p chemo and radiation, DM2, ventral hernia s/p robotic incisional hernia repair and right upper outer quadrant mass s/p RFA guided right lumpectomy (09/2020), peripheral neuropathy, GERD, vocal cord paralysis s/p chronic trach and PEG who presented to the ED with chief complaints of unresponsiveness. ? ?Clinical Assessment and Goals of Care: ?Notes reviewed. Patient has been evaluated by PMT previously, and I have worked with she and her husband.  ? ?She is sitting in bed with trach collar in place. She states she has been in [facility] since last time we spoke and had just been discharged from there and home a few hours before coming in to this admission.  ? ?She states she was so happy to be able to go home and spend time with her husband and looks forward to returning home. She states she was not happy being in a facility but states she will do what is needed to live as long as she can.  ? ?In previous conversations she did not want CPR. Clarified that at this time she currently DOES want CPR; this is different from previous. She states she wants to be able to spend time with her husband and wants any care possible. She does state she would not want to live in a persistent vegetative state, but is unsure of an amount of time to allow to make that decision.  ? ?Will follow up Friday.   ? ?SUMMARY OF RECOMMENDATIONS   ?Full code/full scope. ? ? ?  ? ?Primary Diagnoses: ?Present on Admission: ? Acute on chronic respiratory failure with hypoxia and hypercapnia (HCC) ? ? ?I have reviewed the  medical record, interviewed the patient and family, and examined the patient. The following aspects are pertinent. ? ?Past Medical History:  ?Diagnosis Date  ? Breast cancer, right ([place]) 2002  ? Tx'd with chemotherapy and XRT  ? Chronic respiratory failure ([place])   ? s/p tracheostomy in 2012; on ventilatory support at hs  ? Diabetic peripheral neuropathy ([place]) 08/28/2016  ? GERD (gastroesophageal reflux disease)   ? Glaucoma   ? High cholesterol   ? History of colon polyps   ? Osteoarthritis   ? Personal history of chemotherapy 2002  ? BREAST CA  ? Personal history of radiation therapy 2002  ? BREAST CA  ? S/P percutaneous endoscopic gastrostomy (PEG) tube placement ([place])   ? T2DM (type 2 diabetes mellitus) ([place])   ? 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 Ventral hernia   ? Vocal cord paralysis 2012  ? ?Social History  ? ?Socioeconomic History  ? Marital status: Married  ?  Spouse name: [name]  ? Number of children: Not on file  ? Years of education: Not  on file  ? Highest education level: Not on file  ?Occupational History  ? Not on file  ?Tobacco Use  ? Smoking status: Never  ? Smokeless tobacco: Never  ?Vaping Use  ? Vaping Use: Never used  ?Substance and Sexual Activity  ? Alcohol use: No  ? Drug use: No  ? Sexual activity: Not on file  ?Other Topics Concern  ? Not on file  ?Social History Narrative  ? Lives with husband  ? Right Handed  ? Drinks 2-3 cups caffeine daily  ? ?Social Determinants of Health  ? ?Financial Resource Strain: Not on file  ?Food Insecurity: Not on file  ?Transportation Needs: Not on file  ?Physical Activity: Not on file  ?Stress: Not on file  ?Social Connections: Not on file  ? ?Family History  ?Problem Relation Age of Onset  ? Cancer Father   ?     bone cancer  ? Cancer Sister   ?     lymphoma  ? Cancer Brother   ? Breast cancer Neg Hx   ? ?Scheduled Meds: ? atorvastatin  10 mg Per Tube QHS  ? chlorhexidine gluconate (MEDLINE KIT)  15 mL Mouth Rinse BID  ? Chlorhexidine Gluconate Cloth  6  each Topical Daily  ? enoxaparin  40 mg Subcutaneous QHS  ? escitalopram  30 mg Per Tube Daily  ? feeding supplement (KATE FARMS STANDARD 1.4)  325 mL Per Tube QID  ? [START ON 06/29/2021] feeding supplement (PROSource TF)  45 mL Per Tube Daily  ? free water  120 mL Per Tube QID  ? gabapentin  300 mg Per Tube Q8H  ? hydrOXYzine  25 mg Per Tube BID  ? insulin aspart  0-9 Units Subcutaneous Q4H  ? ipratropium-albuterol  3 mL Nebulization TID  ? mouth rinse  15 mL Mouth Rinse 10 times per day  ? melatonin  10 mg Oral QHS  ? pantoprazole sodium  40 mg Per Tube Daily  ? ?Continuous Infusions: ? cefTRIAXone (ROCEPHIN)  IV Stopped (06/27/21 2300)  ? ?PRN Meds:.acetaminophen, ALPRAZolam, docusate, ipratropium-albuterol, polyethylene glycol ?Medications Prior to Admission:  ?Prior to Admission medications   ?Medication Sig Start Date End Date Taking? Authorizing Provider  ?atorvastatin (LIPITOR) 10 MG tablet Place 10 mg into feeding tube at bedtime.   Yes [provider]  ?enoxaparin (LOVENOX) 40 MG/0.4ML injection Inject 0.4 mLs (40 mg total) into the skin at bedtime. 03/06/21  Yes Darel Hong D, NP  ?escitalopram (LEXAPRO) 10 MG tablet Place 3 tablets (30 mg total) into feeding tube daily. 03/07/21  Yes Darel Hong D, NP  ?gabapentin (NEURONTIN) 300 MG capsule Place 300 mg into feeding tube every 8 (eight) hours.   Yes [provider]  ?hydrOXYzine (ATARAX) 25 MG tablet Place 25 mg into feeding tube 2 (two) times daily.   Yes [provider]  ?insulin glargine-yfgn (SEMGLEE) 100 UNIT/ML injection Inject 40 Units into the skin every 12 (twelve) hours.   Yes [provider]  ?insulin regular (NOVOLIN R) 100 units/mL injection Inject subcutaneously every 6 hours per sliding scale. If BS is less than 150; 0 units, 151-200; 2 units, 201-250; 4 units, 251-300; 6 units, 301-350; 8 units, 351-400; 10 units.  If blood sugar is less than 60 or more than 400 call MD.   Yes [provider]  ?lansoprazole (PREVACID SOLUTAB) 30 MG disintegrating tablet Place 30 mg into feeding tube daily at 12 noon.   Yes [provider]  ?metoprolol tartrate (  LOPRESSOR) 25 MG tablet Place 1 tablet (25 mg total) into feeding tube daily. ?Patient taking differently: Place 12.5 mg into feeding tube 2 (two) times daily. 03/07/21  Yes Bradly Bienenstock, NP  ?Multiple Vitamin (MULTIVITAMIN) LIQD Place 15 mLs into feeding tube daily. 03/07/21  Yes Bradly Bienenstock, NP  ?Propylene Glycol (SYSTANE BALANCE) 0.6 % SOLN Place 1 drop into both eyes 2 (two) times daily.   Yes [provider]  ?psyllium (HYDROCIL/METAMUCIL) 95 % PACK Take 1 packet by mouth daily.   Yes [provider]  ?acetaminophen (TYLENOL) 325 MG tablet Place 2 tablets (650 mg total) into feeding tube every 6 (six) hours as needed for fever. 03/06/21   Bradly Bienenstock, NP  ?acetaminophen (TYLENOL) 650 MG suppository Place 1 suppository (650 mg total) rectally every 4 (four) hours as needed for fever. 03/06/21   Bradly Bienenstock, NP  ?Chlorhexidine Gluconate Cloth 2 % PADS Apply 6 each topically daily at 6 (six) AM. 03/07/21   Bradly Bienenstock, NP  ?chlorhexidine gluconate, MEDLINE KIT, (PERIDEX) 0.12 % solution 15 mLs by Mouth Rinse route 2 (two) times daily. 03/06/21   Bradly Bienenstock, NP  ?diphenhydrAMINE (BENADRYL) 25 mg capsule Take 1 capsule (25 mg total) by mouth every 6 (six) hours as needed for allergies or itching. 03/06/21   Bradly Bienenstock, NP  ?docusate (COLACE) 50 MG/5ML liquid Place 10 mLs (100 mg total) into feeding tube daily as needed for mild constipation. ?Patient taking differently: Place 100 mg into feeding tube every 12 (twelve) hours as needed for mild constipation or moderate constipation. 03/06/21   Bradly Bienenstock, NP  ?insulin aspart (NOVOLOG) 100 UNIT/ML injection Inject 0-20 Units into the skin every 4 (four) hours. 03/06/21   Bradly Bienenstock, NP  ?insulin detemir (LEVEMIR) 100 UNIT/ML injection Inject 0.1  mLs (10 Units total) into the skin 2 (two) times daily. 03/06/21   Bradly Bienenstock, NP  ?ipratropium-albuterol (DUONEB) 0.5-2.5 (3) MG/3ML SOLN Take 3 mLs by nebulization every 2 (two) hours as needed.

## 2021-06-29 DIAGNOSIS — J9621 Acute and chronic respiratory failure with hypoxia: Secondary | ICD-10-CM | POA: Diagnosis not present

## 2021-06-29 DIAGNOSIS — J9622 Acute and chronic respiratory failure with hypercapnia: Secondary | ICD-10-CM | POA: Diagnosis not present

## 2021-06-29 LAB — CBC
HCT: 33 % — ABNORMAL LOW (ref 36.0–46.0)
Hemoglobin: 10.5 g/dL — ABNORMAL LOW (ref 12.0–15.0)
MCH: 27.2 pg (ref 26.0–34.0)
MCHC: 31.8 g/dL (ref 30.0–36.0)
MCV: 85.5 fL (ref 80.0–100.0)
Platelets: 150 10*3/uL (ref 150–400)
RBC: 3.86 MIL/uL — ABNORMAL LOW (ref 3.87–5.11)
RDW: 14.2 % (ref 11.5–15.5)
WBC: 8.5 10*3/uL (ref 4.0–10.5)
nRBC: 0 % (ref 0.0–0.2)

## 2021-06-29 LAB — URINE CULTURE: Culture: >=100000 — AB

## 2021-06-29 LAB — GLUCOSE, CAPILLARY
Glucose-Capillary: 102 mg/dL — ABNORMAL HIGH (ref 70–99)
Glucose-Capillary: 137 mg/dL — ABNORMAL HIGH (ref 70–99)
Glucose-Capillary: 211 mg/dL — ABNORMAL HIGH (ref 70–99)
Glucose-Capillary: 145 mg/dL — ABNORMAL HIGH (ref 70–99)
Glucose-Capillary: 161 mg/dL — ABNORMAL HIGH (ref 70–99)
Glucose-Capillary: 189 mg/dL — ABNORMAL HIGH (ref 70–99)

## 2021-06-29 LAB — BASIC METABOLIC PANEL
Anion gap: 6 (ref 5–15)
BUN: 24 mg/dL — ABNORMAL HIGH (ref 8–23)
CO2: 26 mmol/L (ref 22–32)
Calcium: 8.5 mg/dL — ABNORMAL LOW (ref 8.9–10.3)
Chloride: 106 mmol/L (ref 98–111)
Creatinine, Ser: 0.72 mg/dL (ref 0.44–1.00)
GFR, Estimated: >60 mL/min (ref >60)
Glucose, Bld: 100 mg/dL — ABNORMAL HIGH (ref 70–99)
Potassium: 3.5 mmol/L (ref 3.5–5.1)
Sodium: 138 mmol/L (ref 135–145)

## 2021-06-29 LAB — LACTIC ACID, PLASMA
Lactic Acid, Venous: 2 mmol/L (ref 0.5–1.9)
Lactic Acid, Venous: 1.3 mmol/L (ref 0.5–1.9)

## 2021-06-29 LAB — PHOSPHORUS: Phosphorus: 1.4 mg/dL — ABNORMAL LOW (ref 2.5–4.6)

## 2021-06-29 LAB — MAGNESIUM: Magnesium: 1.9 mg/dL (ref 1.7–2.4)

## 2021-06-29 MED ORDER — CIPROFLOXACIN HCL 500 MG PO TABS
250.0000 mg | ORAL_TABLET | Freq: Two times a day (BID) | ORAL | Status: AC
  Administered 2021-06-29 – 2021-07-01 (×6): 250 mg
  Filled 2021-06-29 (×6): qty 0.5

## 2021-06-29 MED ORDER — SODIUM CHLORIDE 0.9 % IV BOLUS: 500.0000 mL | Freq: Once | INTRAVENOUS | Status: DC

## 2021-06-29 MED ORDER — CIPROFLOXACIN HCL 500 MG PO TABS
250.0000 mg | ORAL_TABLET | Freq: Two times a day (BID) | ORAL | Status: DC
  Filled 2021-06-29: qty 0.5

## 2021-06-29 MED ORDER — POTASSIUM & SODIUM PHOSPHATES 280-160-250 MG PO PACK
1.0000 | PACK | Freq: Three times a day (TID) | ORAL | Status: DC
  Administered 2021-06-29 – 2021-06-30 (×4): 1
  Filled 2021-06-29 (×4): qty 1

## 2021-06-29 MED ORDER — SODIUM CHLORIDE 0.9 % IV SOLN: INTRAVENOUS | Status: DC

## 2021-06-29 MED ORDER — SODIUM CHLORIDE 0.9 % IV BOLUS
500.0000 mL | Freq: Once | INTRAVENOUS | Status: AC
  Administered 2021-06-29: 500 mL via INTRAVENOUS

## 2021-06-29 MED ORDER — MELATONIN 5 MG PO TABS
10.0000 mg | ORAL_TABLET | Freq: Every day | ORAL | Status: DC
  Administered 2021-06-29 – 2021-07-05 (×7): 10 mg
  Filled 2021-06-29 (×8): qty 2

## 2021-06-29 MED ORDER — MIDODRINE HCL 5 MG PO TABS
10.0000 mg | ORAL_TABLET | Freq: Three times a day (TID) | ORAL | Status: DC
  Administered 2021-06-29 – 2021-07-06 (×22): 10 mg
  Filled 2021-06-29 (×23): qty 2

## 2021-06-29 NOTE — Progress Notes (Signed)
PHARMACY CONSULT NOTE ? ?Pharmacy Consult for Electrolyte Monitoring and Replacement  ? ?Recent Labs: ?Potassium (mmol/L)  ?Date Value  ?06/29/2021 3.5  ?12/17/2011 4.1  ? ?Magnesium (mg/dL)  ?Date Value  ?06/29/2021 1.9  ?12/17/2011 2.2  ? ?Calcium (mg/dL)  ?Date Value  ?06/29/2021 8.5 (L)  ? ?Calcium, Total (mg/dL)  ?Date Value  ?12/17/2011 8.8  ? ?Albumin (g/dL)  ?Date Value  ?06/26/2021 3.1 (L)  ?08/28/2016 4.3  ?12/16/2011 4.1  ? ?Phosphorus (mg/dL)  ?Date Value  ?06/29/2021 1.4 (L)  ?12/16/2011 2.2 (L)  ? ?Sodium (mmol/L)  ?Date Value  ?06/29/2021 138  ?08/28/2016 143  ?12/17/2011 139  ? ?Assessment: ?81 year old female admitted with acute respiratory failure 2/2 sepsis, UTI growing ESBL Klebsiella pneumoniae. PMH tracheal stenosis requiring chronic tracheostomy, breast cancer (2002), T2DM, OA, PEG tube.  ? ?Nutrition: Tube feeds + free water flushes 120 mL 4x/day ? ?Goal of Therapy:  ?Electrolytes within normal limits ? ?Plan:  ?--Phos 1.4, Phos-Nak 1 packet per tube TIDACHS scheduled for now ?--Follow-up electrolytes with AM labs tomorrow ? ?Angela David ?06/29/2021 ?2:30 PM ? ?

## 2021-06-29 NOTE — Evaluation (Signed)
Occupational Therapy Evaluation ?Patient Details ?Name: Angela David ?MRN: [id number] ?DOB: 10-24-1940 ?Today's Date: 06/29/2021 ? ? 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 Patient just discharged from [place] to home.  ? ?Clinical Impression ?  ?Patient presenting with decreased Ind in self care, balance, functional mobility/transfers, endurance, and safety awareness. Patient reports being independent at baseline prior to January. Since discharge from hospital she has been at [facility] for ~ 4 months and only returned home for a few hours before returning to hospital. She has since needed assistance for lateral scoot into wheelchair and assist with ADLs.  Patient currently needing mod a of 2 for bed mobility and sit <>stand. Pt with significant posterior bias in standing and reports fearfulness of falling. She was unable to demonstrate lateral scoots as LEs do not reach floor while seated on edge of ICU bed. Pt was unable to tolerate PMSV during therapeutic intervention as O2 continues to drop to low 80's and unable to recover without removal. RN notified. Patient will benefit from acute OT to increase overall independence in the areas of ADLs, functional mobility, and safety awareness in order to safely discharge home with family.  ?   ? ?Recommendations for follow up therapy are one component of a multi-disciplinary discharge planning process, led by the attending physician.  Recommendations may be updated based on patient status, additional functional criteria and insurance authorization.  ? ?Follow Up Recommendations ? Home health OT  ?  ?Assistance  Recommended at Discharge Frequent or constant Supervision/Assistance  ?Patient can return home with the following A lot of help with walking and/or transfers;A lot of help with bathing/dressing/bathroom;Help with stairs or ramp for entrance;Assist for transportation;Assistance with cooking/housework;Direct supervision/assist for medications management;Direct supervision/assist for financial management ? ?  ?Functional Status Assessment ? Patient has had a recent decline in their functional status and demonstrates the ability to make significant improvements in function in a reasonable and predictable amount of time.  ?Equipment Recommendations ? None recommended by OT  ?  ?   ?Precautions / Restrictions Precautions ?Precautions: Fall ?Restrictions ?Weight Bearing Restrictions: No  ? ?  ? ?Mobility Bed Mobility ?Overal bed mobility: Needs Assistance ?Bed Mobility: Supine to Sit, Sit to Supine ?  ?  ?Supine to sit: Mod assist, +2 for physical assistance, HOB elevated ?Sit to supine: Mod assist, +2 for physical assistance ?  ?General bed mobility comments: requires assist to scoot hips to edge of bed and for trunk elevation ?  ? ?Transfers ?Overall transfer level: Needs assistance ?Equipment used: Rolling walker (2 wheels) ?Transfers: Sit to/from Stand ?Sit to Stand: Mod assist, +2 physical assistance ?  ?  ?  ?  ?  ?General transfer comment: Posterior leaning in standing. Unable to attain balance. Required B feet to be blocked to prevent sliding forward ?  ? ?  ?Balance Overall balance assessment: Needs assistance ?Sitting-balance support: Feet unsupported ?Sitting balance-Leahy Scale: Fair ?  ?Postural control: Posterior lean ?Standing balance support: Bilateral upper extremity supported, During functional activity, Reliant on assistive device for balance ?Standing balance-Leahy Scale: Zero ?  ?  ?  ?  ?  ?  ?  ?  ?  ?  ?  ?  ?   ? ?  ADL either performed or assessed with clinical judgement  ? ?ADL Overall ADL's :  Needs assistance/impaired ?  ?  ?  ?  ?  ?  ?  ?  ?  ?  ?Lower Body Dressing: Maximal assistance ?Lower Body Dressing Details (indicate cue type and reason): to don B socks ?  ?  ?  ?  ?  ?  ?  ?   ? ? ? ?Vision Patient Visual Report: No change from baseline ?   ?   ?   ?   ? ?Pertinent Vitals/Pain Pain Assessment ?Pain Assessment: No/denies pain  ? ? ? ?Hand Dominance Right ?  ?Extremity/Trunk Assessment Upper Extremity Assessment ?Upper Extremity Assessment: Generalized weakness ?  ?Lower Extremity Assessment ?Lower Extremity Assessment: Generalized weakness ?  ?Cervical / Trunk Assessment ?Cervical / Trunk Assessment: Normal ?  ?Communication Communication ?Communication: Tracheostomy ?  ?Cognition Arousal/Alertness: Awake/alert ?Behavior During Therapy: [facility] for tasks assessed/performed ?Overall Cognitive Status: Within Functional Limits for tasks assessed ?  ?  ?  ?  ?  ?  ?  ?  ?  ?  ?  ?  ?  ?  ?  ?  ?  ?  ?  ?   ?   ?   ? ? ?Home Living Family/patient expects to be discharged to:: Private residence ?Living Arrangements: Spouse/significant other ?Available Help at Discharge: Family;Available 24 hours/day ?Type of Home: House ?Home Access: Ramped entrance ?  ?  ?  ?  ?  ?Bathroom Shower/Tub: Walk-in shower ?  ?Bathroom Toilet: Standard ?  ?  ?Home Equipment: Cane - single Barista (2 wheels);Shower seat;Wheelchair - manual ?  ?  ?  ? ?  ?Prior Functioning/Environment Prior Level of Function : Needs assist ?  ?  ?  ?  ?  ?  ?Mobility Comments: Prior to January MOD I household mobility using SPC as needed, since being at [facility] she was able to walk some with assist. Otherwise she was able to laterally scoot into wheelchair. ?ADLs Comments: Needs assist ?  ? ?  ?  ?OT Problem List: Decreased strength;Cardiopulmonary status limiting activity;Decreased activity tolerance;Decreased safety awareness;Impaired balance (sitting and/or standing);Decreased knowledge of use of DME or AE;Decreased knowledge of  precautions ?  ?   ?OT Treatment/Interventions: Self-care/ADL training;Therapeutic exercise;Therapeutic activities;Energy conservation;DME and/or AE instruction;Patient/family education;Manual therapy;Balance training  ?  ?OT Goals(Current goals can be found in the care plan section) Acute Rehab OT Goals ?Patient Stated Goal: to go home ?OT Goal Formulation: With patient ?Time For Goal Achievement: 07/13/21 ?Potential to Achieve Goals: Good  ?OT Frequency: Min 2X/week ?  ? ?   ?AM-PAC OT "6 Clicks" Daily Activity     ?Outcome Measure Help from another person eating meals?: None ?Help from another person taking care of personal grooming?: A Little ?Help from another person toileting, which includes using toliet, bedpan, or urinal?: Total ?Help from another person bathing (including washing, rinsing, drying)?: A Lot ?Help from another person to put on and taking off regular upper body clothing?: A Little ?Help from another person to put on and taking off regular lower body clothing?: A Lot ?6 Click Score: 15 ?  ?End of Session Equipment Utilized During Treatment: Rolling walker (2 wheels);Other (comment) (trach and PMSV) ?Nurse Communication: Mobility status;Other (comment) (Pt unable to tolerate PMSV with activity) ? ?Activity Tolerance: Patient tolerated treatment well ?Patient left: in bed;with call bell/phone within reach;with bed alarm set ? ?OT Visit Diagnosis: Unsteadiness on feet (R26.81);Repeated falls (  R29.6);Muscle weakness (generalized) (M62.81)  ?              ?Time: 2536-6440 ?OT Time Calculation (min): 24 min ?Charges:  OT General Charges ?$OT Visit: 1 Visit ?OT Evaluation ?$OT Eval Moderate Complexity: 1 Mod ? ?Darleen Crocker, MS, OTR/L , CBIS ?ascom [phone]  ?06/29/21, 1:16 PM  ?

## 2021-06-29 NOTE — Evaluation (Signed)
Physical Therapy Evaluation ?Patient Details ?Name: Angela David ?MRN: [id number] ?DOB: 02/09/1941 ?Today's Date: 06/29/2021 ? ?History of Present Illness ? 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 Patient just discharged from [place] to home. ?  ?Clinical Impression ? Patient received in bed, she is pleasant and agreeable to PT session. Patient wants to get up and walk. She required mod A +2 for bed mobility and sit to stand with poor balance this session. Heavy posterior leaning in standing. She will continue to benefit from skilled PT while here to improve balance, strength and transfer ability to return home with help from spouse.    ?   ? ?Recommendations for follow up therapy are one component of a multi-disciplinary discharge planning process, led by the attending physician.  Recommendations may be updated based on patient status, additional functional criteria and insurance authorization. ? ?Follow Up Recommendations Home health PT ? ?  ?Assistance Recommended at Discharge Frequent or constant Supervision/Assistance  ?Patient can return home with the following ? A lot of help with walking and/or transfers;A little help with bathing/dressing/bathroom;Help with stairs or ramp for entrance;Assist for transportation;Assistance with cooking/housework ? ?  ?Equipment Recommendations None recommended by PT  ?Recommendations for Other Services ?    ?  ?Functional Status Assessment Patient has had a recent decline in their functional status and demonstrates the ability to make significant improvements in function in a reasonable and predictable amount of time.  ? ?  ?Precautions /  Restrictions Precautions ?Precautions: Fall ?Restrictions ?Weight Bearing Restrictions: No  ? ?  ? ?Mobility ? Bed Mobility ?Overal bed mobility: Needs Assistance ?Bed Mobility: Supine to Sit, Sit to Supine ?  ?  ?Supine to sit: Mod assist, +2 for physical assistance, HOB elevated ?Sit to supine: Mod assist, +2 for physical assistance ?  ?General bed mobility comments: requires assist to scoot hips to edge of bed and for trunk elevation ?  ? ?Transfers ?Overall transfer level: Needs assistance ?Equipment used: Rolling walker (2 wheels) ?Transfers: Sit to/from Stand ?Sit to Stand: Mod assist, +2 physical assistance ?  ?  ?  ?  ?  ?General transfer comment: Posterior leaning in standing. Unable to attain balance. Required B feet to be blocked to prevent sliding forward ?  ? ?Ambulation/Gait ?  ?  ?  ?  ?  ?  ?  ?General Gait Details: unable this session ? ?Stairs ?  ?  ?  ?  ?  ? ?Wheelchair Mobility ?  ? ?Modified Rankin (Stroke Patients Only) ?  ? ?  ? ?Balance Overall balance assessment: Needs assistance ?Sitting-balance support: Feet unsupported ?Sitting balance-Leahy Scale: Fair ?  ?Postural control: Posterior lean ?Standing balance support: Bilateral upper extremity supported, During functional activity, Reliant on assistive device for balance ?Standing balance-Leahy Scale: Zero ?  ?  ?  ?  ?  ?  ?  ?  ?  ?  ?  ?  ?   ? ? ? ?Pertinent Vitals/Pain Pain Assessment ?Pain Assessment: No/denies pain  ? ? ?Home Living Family/patient expects to be discharged to:: Private residence ?Living Arrangements: Spouse/significant other ?Available Help at Discharge: Family;Available 24 hours/day ?Type of Home: House ?Home Access: Ramped entrance ?  ?  ?  ?  ?  Home Equipment: Cane - single Barista (2 wheels);Shower seat;Wheelchair - manual ?   ?  ?Prior Function Prior Level of Function : Needs assist ?  ?  ?  ?  ?  ?  ?Mobility Comments: Prior to January MOD I household mobility using SPC as needed, since being at  [facility] she was able to walk some with assist. Otherwise she was able to laterally scoot into wheelchair. ?ADLs Comments: Needs assist ?  ? ? ?Hand Dominance  ? Dominant Hand: Right ? ?  ?Extremity/Trunk Assessment  ? Upper Extremity Assessment ?Upper Extremity Assessment: Defer to OT evaluation ?  ? ?Lower Extremity Assessment ?Lower Extremity Assessment: Generalized weakness ?  ? ?Cervical / Trunk Assessment ?Cervical / Trunk Assessment: Normal  ?Communication  ? Communication: Tracheostomy  ?Cognition Arousal/Alertness: Awake/alert ?Behavior During Therapy: [facility] for tasks assessed/performed ?Overall Cognitive Status: Within Functional Limits for tasks assessed ?  ?  ?  ?  ?  ?  ?  ?  ?  ?  ?  ?  ?  ?  ?  ?  ?  ?  ?  ? ?  ?General Comments   ? ?  ?Exercises    ? ?Assessment/Plan  ?  ?PT Assessment Patient needs continued PT services  ?PT Problem List Decreased strength;Decreased mobility;Decreased activity tolerance;Decreased balance;Cardiopulmonary status limiting activity ? ?   ?  ?PT Treatment Interventions DME instruction;Therapeutic exercise;Gait training;Balance training;Functional mobility training;Therapeutic activities;Patient/family education;Wheelchair mobility training   ? ?PT Goals (Current goals can be found in the Care Plan section)  ?Acute Rehab PT Goals ?Patient Stated Goal: to return home ?PT Goal Formulation: With patient ?Time For Goal Achievement: 07/13/21 ?Potential to Achieve Goals: Good ? ?  ?Frequency Min 2X/week ?  ? ? ?Co-evaluation PT/OT/SLP Co-Evaluation/Treatment: Yes ?  ?  ?  ?  ? ? ?  ?AM-PAC PT "6 Clicks" Mobility  ?Outcome Measure Help needed turning from your back to your side while in a flat bed without using bedrails?: A Lot ?Help needed moving from lying on your back to sitting on the side of a flat bed without using bedrails?: A Lot ?Help needed moving to and from a bed to a chair (including a wheelchair)?: Total ?Help needed standing up from a chair using your arms (e.g.,  wheelchair or bedside chair)?: Total ?Help needed to walk in hospital room?: Total ?Help needed climbing 3-5 steps with a railing? : Total ?6 Click Score: 8 ? ?  ?End of Session Equipment Utilized During Treatment: Gait belt;Oxygen ?Activity Tolerance: Patient tolerated treatment well ?Patient left: in bed;with call bell/phone within reach ?Nurse Communication: Mobility status ?PT Visit Diagnosis: Unsteadiness on feet (R26.81);Other abnormalities of gait and mobility (R26.89);Muscle weakness (generalized) (M62.81);Difficulty in walking, not elsewhere classified (R26.2) ?  ? ?Time: 4332-9518 ?PT Time Calculation (min) (ACUTE ONLY): 25 min ? ? ?Charges:   PT Evaluation ?$PT Eval Moderate Complexity: 1 Mod ?  ?  ?   ? ? ?Amanda Cockayne, PT, GCS ?06/29/21,12:00 PM ? ?

## 2021-06-29 NOTE — Progress Notes (Signed)
? ? ?NAME:  Angela David, MRN:  [id number], DOB:  Jun 30, 1940, LOS: 2 ?ADMISSION DATE:  06/26/2021, CONSULTATION DATE:  06/27/2021 ?REFERRING MD:  Pryor Curia MD  CHIEF COMPLAINT: Unresponsiveness  ? ? 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 ? ?Patient was recently discharged from the hospital on 03/06/2021 to [facility]  following prolonged admission for Acute on Chronic Hypoxic & Respiratory Failure Secondary to suspected aspiration pneumonia, ? healthcare associated pneumonia (? COLONIZATION) and influenza A viral infection and sepsis due to aspiration pneumonia and suspected abdominal abscess. Per ED notes, patient was discharged from [facility] yesterday evening and was placed on new home vent for approximately 3 hours when she developed respiratory distress. Per patient's husband, altered, diaphoretic and unresponsive therefore EMS was called. ? ?ED Course: In the emergency department, the temperature was 37.1?C, the heart rate 120 beats/minute, the blood pressure  109/47 mm Hg, the respiratory rate 21 breaths/minute, and the oxygen saturation 94 % on TC '@28'$ %.  Stat chest x-ray was obtained and showed mild right basilar atelectasis CT angio negative for acute pulmonary embolism.  CT of the abdomen and pelvis with no acute abnormality. ?Pertinent Labs in Red/Diagnostics Findings: ?Na+/ K+: 8/3.9 ?Glucose: 235 ?BUN/Cr.:  33/0.86 ?  ?WBC/ TMAX: 13.5/ afebrile ?PCT: negative <0.10 ?Lactic acid: 2.0 ?COVID PCR: Negative ?Arterial Blood Gas result:  pO2 62; pCO2 47; pH 7.49;  HCO3 35.8, %O2 Sat 95.7.  ? ?Patient was placed on the ventilator with a tidal volume of 450 and rate of 24.  She  received IV fluids and was started on IV antibiotics for possible urosepsis.  PCCM consulted for vent management ? ?Past Medical History  ? 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 ?Significant Hospital Events   ?5/2: Admitted to the ICU with possible sepsis secondary to UTI and chronic vent management ?5/3: Weaned off vent to trach collar.  Eating and drinking.  Will trial home ventilator/trilogy at night.  Urine culture positive for Klebsiella pneumoniae, sensitivities pending.  Adapt evaluating home resources in anticipation of discharge soon ?5/4: Urine culture sensitivities positive for ESBL Klebsiella pneumoniae UTI.  Antibiotics changed to ciprofloxacin.  Patient with soft blood pressure responsive to IV fluids, suspect dehydration due to poor p.o. intake.  Became hypoxic when working with PT.  Not ready for discharge at this time ? ?Consults:  ?PCCM ? ?Procedures:  ?None ? ?Significant Diagnostic Tests:  ?5/2: CTA abdomen and pelvis> no abnormality ?5/2: CTA Chest> no evidence of acute pulmonary embolism ? ?Micro Data:  ?5/2: Blood culture x2> ?5/2: Urine Culture> Klebsiella  pneumoniae (ESBL) ~sensitive to ciprofloxacin, gentamicin, imipenem, Zosyn ?5/2: MRSA PCR>> negative ? ?Antimicrobials:  ?Ceftriaxone 5/2>5/4 ?Ciprofloxacin 5/4>> ? ?OBJECTIVE  ?Blood pressure (!) 94/35, pulse 84, temperature 99.7 ?F (37.6 ?C), temperature source  Oral, resp. rate 16, height '5\' 4"'$  (1.626 m), weight 74.9 kg, SpO2 93 %. ?   ?Vent Mode: AC ?FiO2 (%):  [28 %] 28 % ?Set Rate:  [15 bmp] 15 bmp ?Vt Set:  [450 mL] 450 mL ?PEEP:  [5 cmH20] 5 cmH20  ? ?Intake/Output Summary (Last 24 hours) at 06/29/2021 1024 ?Last data filed at 06/29/2021 0000 ?Gross per 24 hour  ?Intake 337 ml  ?Output 300 ml  ?Net 37 ml  ? ? ?Filed Weights  ? 06/26/21 2220 06/28/21 1208  ?Weight: 68.5 kg 74.9 kg  ? ?Physical Examination  ?GENERAL: Chronically ill appearing patient sitting in bed, on home vent, in no acute distress ?EYES: Pupils equal, round, reactive to light and accommodation. No scleral icterus. Extraocular muscles intact.  ?HEENT: Head atraumatic, normocephalic. Oropharynx and nasopharynx clear.  ?NECK:  Supple, no jugular venous distention. No thyroid enlargement, no tenderness.  ?LUNGS: Normal breath sounds bilaterally, no wheezing, rales,rhonchi or crepitation. No use of accessory muscles of respiration.  ?CARDIOVASCULAR: regular rate and rhythm S1, S2 normal. No murmurs, rubs, or gallops.  ?ABDOMEN: Soft, nontender, nondistended. Bowel sounds present. No organomegaly or mass. PEG in place.  ?EXTREMITIES: No pedal edema, cyanosis, or clubbing.  ?NEUROLOGIC: Cranial nerves II through XII are intact.  Muscle strength not checked. Sensation intact. Gait not checked.  ?PSYCHIATRIC: The patient is alert and, orientation difficult to assess due to trach, nods appropriately to questions ?SKIN: No obvious rash, lesion, or ulcer.  ? ?Labs/imaging that I havepersonally reviewed  ?(right click and "Reselect all SmartList Selections" daily)  ? ?  ?Labs   ?CBC: ?Recent Labs  ?Lab 06/26/21 ?2209 06/27/21 ?0440 06/28/21 ?5035 06/29/21 ?0336  ?WBC 13.5* 8.4 8.7 8.5  ?NEUTROABS 11.5*  --   --   --   ?HGB 12.4 10.1* 10.7* 10.5*  ?HCT 40.2 32.4* 33.6* 33.0*  ?MCV 88.5 87.1 85.5 85.5  ?PLT 203 169 166 150  ? ? ? ?Basic Metabolic Panel: ?Recent Labs  ?Lab 06/26/21 ?2209 06/27/21 ?0440 06/27/21 ?4656  06/28/21 ?8127 06/29/21 ?0336  ?NA 138 137 138 138 138  ?K 3.9 3.9 3.8 3.5 3.5  ?CL 97* 99 103 102 106  ?CO2 33* '27 28 25 26  '$ ?GLUCOSE 235* 94 88 172* 100*  ?BUN 33* 29* 29* 27* 24*  ?CREATININE 0.86 0.79 0.76 0.97 0.72  ?CALCIUM 9.6 8.4* 9.1 9.0 8.5*  ?MG 2.0 1.7  --  2.0 1.9  ?PHOS  --  2.2*  --  4.4 1.4*  ? ? ?GFR: ?Estimated Creatinine Clearance: 57.5 mL/min (by C-G formula based on SCr of 0.72 mg/dL). ?Recent Labs  ?Lab 06/26/21 ?2209 06/27/21 ?0056 06/27/21 ?0440 06/28/21 ?5170 06/29/21 ?0336  ?PROCALCITON <0.10  --  <0.10 0.16  --   ?WBC 13.5*  --  8.4 8.7 8.5  ?LATICACIDVEN 2.0* 1.7  --   --   --   ? ? ? ?Liver Function Tests: ?Recent Labs  ?Lab 06/26/21 ?2209  ?AST 25  ?ALT 12  ?ALKPHOS 61  ?BILITOT 0.4  ?PROT 7.6  ?ALBUMIN 3.1*  ? ? ?No results for input(s): LIPASE, AMYLASE in the last 168 hours. ?No results for input(s): AMMONIA in the last 168 hours. ? ?ABG ?   ?Component Value Date/Time  ? PHART 7.49 (H) 06/26/2021 2205  ?  PCO2ART 47 06/26/2021 2205  ? PO2ART 62 (L) 06/26/2021 2205  ? HCO3 35.8 (H) 06/26/2021 2205  ? ACIDBASEDEF 0.9 01/19/2020 1024  ? O2SAT 95.7 06/26/2021 2205  ? ?  ? ?Coagulation Profile: ?No results for input(s): INR, PROTIME in the last 168 hours. ? ?Cardiac Enzymes: ?No results for input(s): CKTOTAL, CKMB, CKMBINDEX, TROPONINI in the last 168 hours. ? ?HbA1C: ?Hgb A1c MFr Bld  ?Date/Time Value Ref Range Status  ?06/27/2021 04:40 AM 6.1 (H) 4.8 - 5.6 % Final  ?  Comment:  ?  (NOTE) ?Pre diabetes:          5.7%-6.4% ? ?Diabetes:              >6.4% ? ?Glycemic control for   <7.0% ?adults with diabetes ?  ?01/20/2020 07:19 AM 6.3 (H) 4.8 - 5.6 % Final  ?  Comment:  ?  (NOTE) ?Pre diabetes:          5.7%-6.4% ? ?Diabetes:              >6.4% ? ?Glycemic control for   <7.0% ?adults with diabetes ?  ? ? ?CBG: ?Recent Labs  ?Lab 06/28/21 ?1538 06/28/21 ?1922 06/28/21 ?2331 06/29/21 ?0307 06/29/21 ?0842  ?GLUCAP 139* 201* 174* 102* 137*  ? ? ? ?Review of Systems:   ?Positives in BOLD:  Currently denies all complaints ?Gen: Denies fever, chills, weight change, fatigue, night sweats ?HEENT: Denies blurred vision, double vision, hearing loss, tinnitus, sinus congestion, rhinorrhea, sore throat,

## 2021-06-30 DIAGNOSIS — Z7189 Other specified counseling: Secondary | ICD-10-CM | POA: Diagnosis not present

## 2021-06-30 DIAGNOSIS — J9622 Acute and chronic respiratory failure with hypercapnia: Secondary | ICD-10-CM | POA: Diagnosis not present

## 2021-06-30 DIAGNOSIS — J9621 Acute and chronic respiratory failure with hypoxia: Secondary | ICD-10-CM | POA: Diagnosis not present

## 2021-06-30 LAB — GLUCOSE, CAPILLARY
Glucose-Capillary: 113 mg/dL — ABNORMAL HIGH (ref 70–99)
Glucose-Capillary: 129 mg/dL — ABNORMAL HIGH (ref 70–99)
Glucose-Capillary: 177 mg/dL — ABNORMAL HIGH (ref 70–99)
Glucose-Capillary: 178 mg/dL — ABNORMAL HIGH (ref 70–99)
Glucose-Capillary: 190 mg/dL — ABNORMAL HIGH (ref 70–99)
Glucose-Capillary: 195 mg/dL — ABNORMAL HIGH (ref 70–99)
Glucose-Capillary: 216 mg/dL — ABNORMAL HIGH (ref 70–99)

## 2021-06-30 LAB — BASIC METABOLIC PANEL
Anion gap: 9 (ref 5–15)
BUN: 23 mg/dL (ref 8–23)
CO2: 24 mmol/L (ref 22–32)
Calcium: 8.5 mg/dL — ABNORMAL LOW (ref 8.9–10.3)
Chloride: 108 mmol/L (ref 98–111)
Creatinine, Ser: 0.71 mg/dL (ref 0.44–1.00)
GFR, Estimated: >60 mL/min (ref >60)
Glucose, Bld: 117 mg/dL — ABNORMAL HIGH (ref 70–99)
Potassium: 3.6 mmol/L (ref 3.5–5.1)
Sodium: 141 mmol/L (ref 135–145)

## 2021-06-30 LAB — CBC
HCT: 33.4 % — ABNORMAL LOW (ref 36.0–46.0)
Hemoglobin: 10.5 g/dL — ABNORMAL LOW (ref 12.0–15.0)
MCH: 27.3 pg (ref 26.0–34.0)
MCHC: 31.4 g/dL (ref 30.0–36.0)
MCV: 86.8 fL (ref 80.0–100.0)
Platelets: 146 10*3/uL — ABNORMAL LOW (ref 150–400)
RBC: 3.85 MIL/uL — ABNORMAL LOW (ref 3.87–5.11)
RDW: 14.1 % (ref 11.5–15.5)
WBC: 8.3 10*3/uL (ref 4.0–10.5)
nRBC: 0 % (ref 0.0–0.2)

## 2021-06-30 LAB — PHOSPHORUS: Phosphorus: 1.5 mg/dL — ABNORMAL LOW (ref 2.5–4.6)

## 2021-06-30 LAB — MAGNESIUM: Magnesium: 1.7 mg/dL (ref 1.7–2.4)

## 2021-06-30 MED ORDER — MAGNESIUM SULFATE 2 GM/50ML IV SOLN
2.0000 g | Freq: Once | INTRAVENOUS | Status: AC
  Administered 2021-06-30: 2 g via INTRAVENOUS
  Filled 2021-06-30: qty 50

## 2021-06-30 MED ORDER — POTASSIUM PHOSPHATES 15 MMOLE/5ML IV SOLN
30.0000 mmol | Freq: Once | INTRAVENOUS | Status: AC
  Administered 2021-06-30: 30 mmol via INTRAVENOUS
  Filled 2021-06-30: qty 10

## 2021-06-30 MED ORDER — ALPRAZOLAM 0.25 MG PO TABS
0.2500 mg | ORAL_TABLET | Freq: Three times a day (TID) | ORAL | Status: DC | PRN
  Administered 2021-07-01 – 2021-07-05 (×5): 0.25 mg
  Filled 2021-06-30 (×5): qty 1

## 2021-06-30 MED ORDER — POTASSIUM CHLORIDE 20 MEQ PO PACK
40.0000 meq | PACK | Freq: Once | ORAL | Status: AC
  Administered 2021-06-30: 40 meq
  Filled 2021-06-30: qty 2

## 2021-06-30 MED ORDER — ACETAMINOPHEN 325 MG PO TABS
650.0000 mg | ORAL_TABLET | Freq: Four times a day (QID) | ORAL | Status: DC | PRN
  Administered 2021-06-30 – 2021-07-05 (×10): 650 mg
  Filled 2021-06-30 (×11): qty 2

## 2021-06-30 MED ORDER — CIPROFLOXACIN HCL 250 MG PO TABS
250.0000 mg | ORAL_TABLET | Freq: Two times a day (BID) | ORAL | 0 refills | Status: DC
Start: 2021-06-30 — End: 2021-07-05

## 2021-06-30 MED ORDER — POTASSIUM & SODIUM PHOSPHATES 280-160-250 MG PO PACK
1.0000 | PACK | Freq: Once | ORAL | Status: AC
  Administered 2021-06-30: 1
  Filled 2021-06-30: qty 1

## 2021-06-30 NOTE — Progress Notes (Signed)
? ?                                                                                                                                                     ?                                                   ?Daily Progress Note  ? ?Patient Name: Angela David       Date: 06/30/2021 ?DOB: [birth year]  Age: 81 y.o. MRN#: [id number] ?Attending Physician: Flora Lipps, MD ?Primary Care Physician: System, Provider Not In ?Admit Date: 06/26/2021 ? ?Reason for Consultation/Follow-up: Establishing goals of care ? ?Subjective: ?Notes reviewed. In to see patient. No family at bedside. She states she is feeling much better. She confirms that she would like all care possible including CPR. She is hopeful to D/C home.  ? ?Length of Stay: 3 ? ?Current Medications: ?Scheduled Meds:  ? atorvastatin  10 mg Per Tube QHS  ? chlorhexidine gluconate (MEDLINE KIT)  15 mL Mouth Rinse BID  ? Chlorhexidine Gluconate Cloth  6 each Topical Daily  ? ciprofloxacin  250 mg Per Tube BID  ? enoxaparin  40 mg Subcutaneous QHS  ? escitalopram  30 mg Per Tube Daily  ? feeding supplement (KATE FARMS STANDARD 1.4)  325 mL Per Tube QID  ? feeding supplement (PROSource TF)  45 mL Per Tube Daily  ? free water  120 mL Per Tube QID  ? gabapentin  300 mg Per Tube Q8H  ? hydrOXYzine  25 mg Per Tube BID  ? insulin aspart  0-9 Units Subcutaneous Q4H  ? ipratropium-albuterol  3 mL Nebulization TID  ? mouth rinse  15 mL Mouth Rinse 10 times per day  ? melatonin  10 mg Per Tube QHS  ? midodrine  10 mg Per Tube TID WC  ? pantoprazole sodium  40 mg Per Tube Daily  ? ? ?Continuous Infusions: ? potassium PHOSPHATE IVPB (in mmol) 30 mmol (06/30/21 1311)  ? ? ?PRN Meds: ?acetaminophen, ALPRAZolam, docusate, ipratropium-albuterol, polyethylene glycol ? ?Physical Exam ?Pulmonary:  ?   Comments: Trach collar in place. ?Skin: ?   General: Skin is warm and dry.  ?Neurological:  ?   Mental Status: She is alert.  ?         ? ?Vital Signs: BP (!) 114/59   Pulse 80   Temp 98.6  ?F (37 ?C) (Oral)   Resp 16   Ht '5\' 4"'  (1.626 m)   Wt 74.9 kg   SpO2 96%   BMI 28.34 kg/m?  ?SpO2: SpO2: 96 % ?O2 Device: O2 Device: Tracheostomy Collar ?O2 Flow Rate: O2 Flow Rate (L/min): 5  L/min ? ?Intake/output summary:  ?Intake/Output Summary (Last 24 hours) at 06/30/2021 1328 ?Last data filed at 06/30/2021 1100 ?Gross per 24 hour  ?Intake 2291.16 ml  ?Output 500 ml  ?Net 1791.16 ml  ? ?LBM: Last BM Date : 06/29/21 ?Baseline Weight: Weight: 68.5 kg ?Most recent weight: Weight: 74.9 kg ? ? ?Patient Active Problem List  ? Diagnosis Date Noted  ? Acute on chronic respiratory failure with hypoxia and hypercapnia ([place]) 06/27/2021  ? 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 Acute pulmonary edema (HCC)   ? Somnolence   ? Sepsis ([place]) 02/20/2021  ? Abdominal wall abscess   ? Chronic respiratory failure with hypoxia ([place]) 03/29/2020  ? Chronic diastolic CHF (congestive heart failure) ([place]) 01/31/2020  ? Uncontrolled type 2 diabetes mellitus with hyperglycemia ([place]) 01/31/2020  ? AF (paroxysmal atrial fibrillation) ([place]) 01/31/2020  ? Aspiration pneumonia of both lower lobes due to gastric secretions ([place]) 01/28/2020  ? Acute metabolic encephalopathy 58/52/7782  ? Acute respiratory failure ([place]) 01/23/2020  ? Left lower lobe pneumonia 01/16/2020  ? 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 Drug-induced polyneuropathy ([place]) 05/05/2019  ? 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[place]) 05/05/2019  ? Weakness   ? Hypoxia   ? Acute respiratory failure with hypoxia and hypercapnia ([place]) 03/21/2019  ? Hypotension 03/21/2019  ? Acute encephalopathy 03/21/2019  ? Multiple fractures of ribs, right side, initial encounter for closed fracture 12/25/2018  ? Colon polyp 11/06/2018  ? Esophageal dysphagia   ? 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   ? Stricture and stenosis of esophagus   ? Elevated CEA 09/02/2018  ? Goals of care, counseling/discussion 09/02/2018  ? Polyp of transverse colon 09/02/2018  ? Foreign body in right  foot 05/26/2018  ? History of tracheostomy 04/30/2018  ? Moderate episode of recurrent major depressive disorder ([place]) 04/03/2018  ? Poor balance 04/03/2018  ? Shoulder lesion, right 02/28/2017  ? Cervical radiculopathy 09/28/2016  ? Peripheral neuropathy 08/28/2016  ? Diabetic peripheral neuropathy ([place]) 08/28/2016  ? Leg weakness, bilateral 08/13/2016  ? Gait abnormality 08/13/2016  ? Carcinoma of overlapping sites of right breast in female, estrogen receptor negative ([place]) 12/27/2015  ? Malignant neoplasm of breast ([place]) 03/28/2015  ? 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 Hypercholesterolemia 03/28/2015  ? Recurrent major depressive disorder, in partial remission ([place]) 03/01/2015  ? 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 Cephalalgia 09/15/2014  ? Headache due to trauma 09/15/2014  ? Osteopenia 01/07/2014  ? Age-related osteoporosis without current pathological fracture 01/07/2014  ? History of colonic polyps 12/14/2013  ? History of breast cancer 11/26/2012  ? H/O malignant neoplasm of breast 11/26/2012  ? 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 Tracheostomy present ([place]) 12/06/2011  ? Calcification of bronchus or trachea 01/16/2011  ? 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 Anxiety state 12/19/2010  ? Blood in sputum 12/19/2010  ? HLD (hyperlipidemia) 12/19/2010  ? Type 2 diabetes mellitus ([place]) 12/19/2010  ? ? ?Palliative Care Assessment & Plan  ? ?Recommendations/Plan: ? ?Full code/full scope.  ? ? ?Code Status: ? ?  ?Code Status Orders  ?(From admission, onward)  ?  ? ? ?  ? ?  Start     Ordered  ? 06/27/21 0230  Full code  Continuous       ? 06/27/21 0231  ? ?  ?  ? ?  ? ?Code Status History   ? ? Date Active Date Inactive Code Status Order ID Comments User Context  ? 02/20/2021 [id number] 03/07/2021 3614  Partial Code [id number]  Leslee Home, DO ED  ? 01/26/2020 1058 02/13/2020 1818 Partial Code [id number]  Asencion Gowda, NP Inpatient  ? 01/23/2020 2152 01/26/2020 1058 Full Code  [id number]  Rust-Chester, Huel Cote, NP ED  ? 01/15/2020 2239 01/21/2020 2131 Full Code [id number]  Criss Alvine, DO ED  ? 03/21/2019 0108 04/01/2019 2227 Full Code [id number]  Awilda Bill, NP ED  ? 12/26/2018 0336 12/31/2018 2219 Full Code [id number]  Carlus Pavlov ED  ? 11/06/2018 1233 11/09/2018 1917 Full Code [id number]  Jules Husbands, MD Inpatient  ? ?  ? ?Thank you for allowing the Palliative Medicine Team to assist in the care of this patient. ? ? ?Asencion Gowda, NP ? ?Please contact Palliative Medicine Team phone at [phone] for questions and concerns.  ? ? ? ? ? ?

## 2021-06-30 NOTE — Progress Notes (Signed)
Patients husband updated concerning patient discharge cancelled. EMS here to transport patient, got to 1st floor and patient went into respiratory distress on 4L/31% trach collar. EMS then brought patient back to ICU bed 4. Patient bagged until RT at bedside to connect to vent. Benjamine Mola, NP at bedside, also.  ?

## 2021-06-30 NOTE — Plan of Care (Signed)
Continuing with plan of care. 

## 2021-06-30 NOTE — Progress Notes (Signed)
EMS is not sure of ETA at present.  Patient requested for this nurse to update her husband. Telephoned husband and update given on EMS status. Husband verbalized understanding. Will call with and notify husband when EMS leaves with patient.  ?

## 2021-06-30 NOTE — Discharge Summary (Signed)
Physician Discharge Summary  ?Patient ID: ?Angela David ?MRN: [id number] ?DOB/AGE: [birth year] 81 y.o. ? ?Admit date: 06/26/2021 ?Discharge date: 06/30/2021 ? ?Admission Diagnoses: ? ?Discharge Diagnoses:  ?Principal Problem: ?  Acute on chronic respiratory failure with hypoxia and hypercapnia (HCC) ? 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 ?  ?Patient was recently discharged from the hospital on 03/06/2021 to [facility]  following prolonged admission for Acute on Chronic Hypoxic & Respiratory Failure Secondary to suspected aspiration pneumonia, ? healthcare associated pneumonia (? COLONIZATION) and influenza A viral infection and sepsis due to aspiration pneumonia and suspected abdominal abscess. Per ED notes, patient was discharged from [facility] yesterday evening and was placed on new home vent for approximately 3 hours when she developed respiratory distress. Per patient's husband, altered, diaphoretic and unresponsive therefore EMS was called. ?  ?ED Course: In the emergency department, the temperature was 37.1?C, the heart rate 120 beats/minute, the blood pressure  109/47 mm Hg, the respiratory rate 21 breaths/minute, and the oxygen saturation 94 % on TC '@28'$ %.  Stat chest x-ray was obtained and showed mild right basilar atelectasis CT angio negative for acute pulmonary embolism.  CT of the abdomen and pelvis with no acute abnormality. ?Pertinent Labs in Red/Diagnostics Findings: ?Na+/ K+: 8/3.9 ?Glucose: 235 ?BUN/Cr.:  33/0.86 ?  ?WBC/ TMAX: 13.5/ afebrile ?PCT: negative <0.10 ?Lactic acid: 2.0 ?COVID PCR: Negative ?Arterial Blood Gas result:  pO2 62; pCO2 47; pH 7.49;  HCO3 35.8, %O2  Sat 95.7.  ?  ?Patient was placed on the ventilator with a tidal volume of 450 and rate of 24.  She received IV fluids and was started on IV antibiotics for possible urosepsis.  PCCM consulted for vent management ?  ?Past Medical History  ?  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  ?Significant Hospital Events   ?5/2: Admitted to the ICU with possible sepsis secondary to UTI and chronic vent management ?5/3: Weaned off vent to trach collar.  Eating and drinking.  Will trial home ventilator/trilogy at night.  Urine culture positive for Klebsiella pneumoniae, sensitivities pending.  Adapt evaluating home resources in anticipation of discharge soon ?5/4: Urine culture sensitivities positive for ESBL Klebsiella pneumoniae UTI.  Antibiotics changed to ciprofloxacin.  Patient with soft blood pressure responsive to IV fluids, suspect dehydration due to poor p.o. intake.  Became hypoxic when working with PT.  Not ready for discharge at this time ?  ?Consults:  ?PCCM ?  ?Procedures:  ?None ?  ?Significant Diagnostic Tests:  ?5/2: CTA abdomen and pelvis> no abnormality ?5/2: CTA Chest>  no evidence of acute pulmonary embolism ?  ?Micro Data:  ?5/2: Blood culture x2> ?5/2: Urine Culture> Klebsiella pneumoniae (ESBL) ~sensitive to ciprofloxacin, gentamicin, imipenem, Zosyn ?5/2: MRSA PCR>> negative ?  ?Antimicrobials:  ?Ceftriaxone  5/2>5/4 ?Ciprofloxacin 5/4>> ?  ?OBJECTIVE  ?Blood pressure (!) 94/35, pulse 84, temperature 99.7 ?F (37.6 ?C), temperature source Oral, resp. rate 16, height '5\' 4"'$  (1.626 m), weight 74.9 kg, SpO2 93 %. ?   ? ?>  ?Vent Mode: AC ?FiO2 (%):  [28 %] 28 % ?Set Rate:  [15 bmp] 15 bmp ?Vt Set:  [450 mL] 450 mL ?PEEP:  [5 cmH20] 5 cmH20  ?  ?  ?Intake/Output Summary (Last 24 hours) at 06/29/2021 1024 ?Last data filed at 06/29/2021 0000 ?   ?Gross per 24 hour  ?Intake 337 ml  ?Output 300 ml  ?Net 37 ml  ?  ?  ?    ?Filed Weights  ?  06/26/21 2220 06/28/21 1208  ?Weight: 68.5 kg 74.9 kg  ?  ?Physical Examination  ?GENERAL: Chronically ill appearing patient sitting in bed, on home vent, in no acute distress ?EYES: Pupils equal, round, reactive to light and accommodation. No scleral icterus. Extraocular muscles intact.  ?HEENT: Head atraumatic, normocephalic. Oropharynx and nasopharynx clear.  ?NECK:  Supple, no jugular venous distention. No thyroid enlargement, no tenderness.  ?LUNGS: Normal breath sounds bilaterally, no wheezing, rales,rhonchi or crepitation. No use of accessory muscles of respiration.  ?CARDIOVASCULAR: regular rate and rhythm S1, S2 normal. No murmurs, rubs, or gallops.  ?ABDOMEN: Soft, nontender, nondistended. Bowel sounds present. No organomegaly or mass. PEG in place.  ?EXTREMITIES: No pedal edema, cyanosis, or clubbing.  ?NEUROLOGIC: Cranial nerves II through XII are intact.  Muscle strength not checked. Sensation intact. Gait not checked.  ?PSYCHIATRIC: The patient is alert and, orientation difficult to assess due to trach, nods appropriately to questions ?SKIN: No obvious rash, lesion, or ulcer.  ?  ?Labs/imaging that I havepersonally reviewed  ?(right click and "Reselect all SmartList Selections" daily)  ? ?  ?Labs   ?CBC: ?Last Labs   ?      ?Recent Labs  ?Lab 06/26/21 ?2209 06/27/21 ?0440 06/28/21 ?6644 06/29/21 ?0336  ?WBC 13.5* 8.4 8.7 8.5  ?NEUTROABS 11.5*  --   --   --   ?HGB 12.4 10.1* 10.7* 10.5*   ?HCT 40.2 32.4* 33.6* 33.0*  ?MCV 88.5 87.1 85.5 85.5  ?PLT 203 169 166 150  ?   ?  ?  ?Basic Metabolic Panel: ?Last Labs   ?       ?Recent Labs  ?Lab 06/26/21 ?2209 06/27/21 ?0440 06/27/21 ?0347 06/28/21 ?4259 06/29/21 ?0336  ?NA 138 137 138 138 138  ?K 3.9 3.9 3.8 3.5 3.5  ?CL 97* 99 103 102 106  ?CO2 33* '27 28 25 26  '$ ?GLUCOSE 235* 94 88 172* 100*  ?BUN 33* 29* 29* 27* 24*  ?CREATININE 0.86 0.79 0.76 0.97 0.72  ?CALCIUM 9.6 8.4* 9.1 9.0 8.5*  ?MG 2.0 1.7  --  2.0 1.9  ?PHOS  --  2.2*  --  4.4 1.4*  ?   ?  ?GFR: ?Estimated Creatinine Clearance: 57.5 mL/min (by C-G formula based on SCr of 0.72 mg/dL). ?Last Labs   ?       ?Recent Labs  ?Lab 06/26/21 ?2209 06/27/21 ?0056 06/27/21 ?0440 06/28/21 ?5638 06/29/21 ?0336  ?PROCALCITON <0.10  --  <0.10 0.16  --   ?WBC 13.5*  --  8.4 8.7 8.5  ?LATICACIDVEN 2.0* 1.7  --   --   --   ?   ?  ?  ?  Liver Function Tests: ?Last Labs   ?   ?Recent Labs  ?Lab 06/26/21 ?2209  ?AST 25  ?ALT 12  ?ALKPHOS 61  ?BILITOT 0.4  ?PROT 7.6  ?ALBUMIN 3.1*  ?   ?  ?Last Labs   ?No results for input(s): LIPASE, AMYLASE in the last 168 hours.  ? ?Last Labs   ?No results for input(s): AMMONIA in the last 168 hours.  ? ?  ?ABG ?Labs (Brief)  ?     ?   ?Component Value Date/Time  ?  PHART 7.49 (H) 06/26/2021 2205  ?  PCO2ART 47 06/26/2021 2205  ?  PO2ART 62 (L) 06/26/2021 2205  ?  HCO3 35.8 (H) 06/26/2021 2205  ?  ACIDBASEDEF 0.9 01/19/2020 1024  ?  O2SAT 95.7 06/26/2021 2205  ?  ?  ?  ?  ?Coagulation Profile: ?Last Labs   ?No results for input(s): INR, PROTIME in the last 168 hours.  ? ?  ?Cardiac Enzymes: ?Last Labs   ?No results for input(s): CKTOTAL, CKMB, CKMBINDEX, TROPONINI in the last 168 hours.  ? ?  ?HbA1C: ?Last Labs  ?       ?Hgb A1c MFr Bld  ?Date/Time Value Ref Range Status  ?06/27/2021 04:40 AM 6.1 (H) 4.8 - 5.6 % Final  ?    Comment:  ?    (NOTE) ?Pre diabetes:          5.7%-6.4% ?  ?Diabetes:              >6.4% ?  ?Glycemic control for   <7.0% ?adults with diabetes ?   ?01/20/2020  07:19 AM 6.3 (H) 4.8 - 5.6 % Final  ?    Comment:  ?    (NOTE) ?Pre diabetes:          5.7%-6.4% ?  ?Diabetes:              >6.4% ?  ?Glycemic control for   <7.0% ?adults with diabetes ?   ?  ?  ?  ?CBG: ?Last Labs   ?

## 2021-06-30 NOTE — Progress Notes (Signed)
Occupational Therapy Treatment ?Patient Details ?Name: Angela David ?MRN: [id number] ?DOB: 02-26-1941 ?Today's Date: 06/30/2021 ? ? 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 Patient just discharged from [facility] to home. ?  ?OT comments ? Upon entering the room, pt supine in bed but awake and agreeable to OT intervention. OT arrived with foot stool for B LE stability to attempt lateral scoot into recliner chair but pt refuses plan and reports fatigue. Supine >sit with mod A for trunk support and B LEs to EOB. Pt initially needing min A for static sitting balance but able to progress to close supervision with cuing for anterior weight shift. Pt maintaining balance for ~ 10 minutes and changing hospital gown with assistance from therapist while seated. Pt requests to return to supine with mod - max A for task. Pt repositioned for comfort with all needs within reach. PMSV not utilized this session. Pt continues to benefit from OT intervention.   ? ?Recommendations for follow up therapy are one component of a multi-disciplinary discharge planning process, led by the attending physician.  Recommendations may be updated based on patient status, additional functional criteria and insurance authorization. ?   ?Follow Up Recommendations ? Home health OT  ?  ?Assistance Recommended at Discharge Frequent or constant Supervision/Assistance  ?Patient can return home with the following ? A lot of help with walking and/or transfers;A lot of help with bathing/dressing/bathroom;Help with stairs or ramp for entrance;Assist for transportation;Assistance with  cooking/housework;Direct supervision/assist for medications management;Direct supervision/assist for financial management ?  ?Equipment Recommendations ? None recommended by OT  ?  ?   ?Precautions / Restrictions Precautions ?Precautions: Fall  ? ? ?  ? ?Mobility Bed Mobility ?Overal bed mobility: Needs Assistance ?Bed Mobility: Supine to Sit, Sit to Supine ?  ?  ?Supine to sit: Mod assist ?Sit to supine: Mod assist ?  ?General bed mobility comments: for trunk support and LEs ?  ? ?Transfers ?  ?  ?  ?  ?  ?  ?  ?  ?  ?General transfer comment: Pt refusing transfers this session secondary to fatigue ?  ?  ?Balance Overall balance assessment: Needs assistance ?Sitting-balance support: Feet supported ?Sitting balance-Leahy Scale: Fair ?Sitting balance - Comments: min assist progressing to supervison with cuing for anterior weight shift ?  ?  ?  ?  ?  ?  ?  ?  ?  ?  ?  ?  ?  ?  ?  ?   ? ?ADL either performed or assessed with clinical judgement  ? ? ?Extremity/Trunk Assessment Upper Extremity Assessment ?Upper Extremity Assessment: Generalized weakness ?  ?Lower Extremity Assessment ?Lower Extremity Assessment: Generalized weakness ?  ?  ?  ? ?Vision Patient Visual Report: No change from baseline ?  ?  ?   ?   ? ?Cognition Arousal/Alertness: Awake/alert ?Behavior During Therapy: [facility] for tasks assessed/performed ?Overall Cognitive Status: Within Functional Limits for tasks assessed ?  ?  ?  ?  ?  ?  ?  ?  ?  ?  ?  ?  ?  ?  ?  ?  ?  ?  ?  ?   ?   ?   ?   ? ? ?  Pertinent Vitals/ Pain       Pain Assessment ?Pain Assessment: No/denies pain ? ?   ?   ? ?Frequency ? Min 2X/week  ? ? ? ? ?  ?Progress Toward Goals ? ?OT Goals(current goals can now be found in the care plan section) ? Progress towards OT goals: Progressing toward goals ? ?Acute Rehab OT Goals ?Patient Stated Goal: to go home ?OT Goal Formulation: With patient ?Time For Goal Achievement: 07/13/21 ?Potential to Achieve Goals: Good  ?Plan Discharge plan remains  appropriate;Frequency remains appropriate   ? ?   ?AM-PAC OT "6 Clicks" Daily Activity     ?Outcome Measure ? ? Help from another person eating meals?: None ?Help from another person taking care of personal grooming?: A Little ?Help from another person toileting, which includes using toliet, bedpan, or urinal?: Total ?Help from another person bathing (including washing, rinsing, drying)?: A Lot ?Help from another person to put on and taking off regular upper body clothing?: A Little ?Help from another person to put on and taking off regular lower body clothing?: A Lot ?6 Click Score: 15 ? ?  ?End of Session Equipment Utilized During Treatment:  (trach) ? ?OT Visit Diagnosis: Unsteadiness on feet (R26.81);Repeated falls (R29.6);Muscle weakness (generalized) (M62.81) ?  ?Activity Tolerance Patient tolerated treatment well ?  ?Patient Left in bed;with call bell/phone within reach;with bed alarm set ?  ?Nurse Communication Mobility status ?  ? ?   ? ?Time: 8295-6213 ?OT Time Calculation (min): 18 min ? ?Charges: OT General Charges ?$OT Visit: 1 Visit ?OT Treatments ?$Therapeutic Activity: 8-22 mins ? ?Darleen Crocker, MS, OTR/L , CBIS ?ascom [phone]  ?06/30/21, 2:41 PM  ?

## 2021-06-30 NOTE — Progress Notes (Addendum)
PHARMACY CONSULT NOTE ? ?Pharmacy Consult for Electrolyte Monitoring and Replacement  ? ?Recent Labs: ?Potassium (mmol/L)  ?Date Value  ?06/30/2021 3.6  ?12/17/2011 4.1  ? ?Magnesium (mg/dL)  ?Date Value  ?06/30/2021 1.7  ?12/17/2011 2.2  ? ?Calcium (mg/dL)  ?Date Value  ?06/30/2021 8.5 (L)  ? ?Calcium, Total (mg/dL)  ?Date Value  ?12/17/2011 8.8  ? ?Albumin (g/dL)  ?Date Value  ?06/26/2021 3.1 (L)  ?08/28/2016 4.3  ?12/16/2011 4.1  ? ?Phosphorus (mg/dL)  ?Date Value  ?06/30/2021 1.5 (L)  ?12/16/2011 2.2 (L)  ? ?Sodium (mmol/L)  ?Date Value  ?06/30/2021 141  ?08/28/2016 143  ?12/17/2011 139  ? ?Assessment: ?81 year old female admitted with acute respiratory failure 2/2 sepsis, UTI growing ESBL Klebsiella pneumoniae. PMH tracheal stenosis requiring chronic tracheostomy, breast cancer (2002), T2DM, OA, PEG tube.  ? ?Nutrition: Tube feeds + free water flushes 120 mL 4x/day ? ?Goal of Therapy:  ?Electrolytes within normal limits ? ?Plan:  ?--K 3.6, Kcl 40 mEq per tube x 1 dose ?--Mg 1.7, IV magnesium sulfate 2 g x 1 ?--Phos 1.5, discontinue Phos-Nak supplement. Give IV potassium phosphate 30 mmol x 1 ?--Follow-up electrolytes with AM labs tomorrow ? ?Angela David ?06/30/2021 ?8:05 AM ? ?

## 2021-06-30 NOTE — Progress Notes (Signed)
Patient unable to make transport from hospital to home due to respiratory distress before leaving facility. EMS bagging patient when I arrived to room. Placed patient on servo per home vent settings volume 450 rate 15 peep 5. O2 set at 28%. Will continue to monitor.  ?

## 2021-06-30 NOTE — TOC Transition Note (Signed)
Transition of Care (TOC) - CM/SW Discharge Note ? ? ?Patient Details  ?Name: Angela David ?MRN: [id number] ?Date of Birth: 1940/04/17 ? ?Transition of Care (TOC) CM/SW Contact:  ?Shelbie Hutching, RN ?Phone Number: ?06/30/2021, 2:20 PM ? ? ?Clinical Narrative:    ?Patient is medically cleared for discharge home with home health services.  Patient will go home with [facility] with RN, PT, [name], [name]  Patient has potassium phosphate currently running it will be completed around 7pm.  RNCM has arranged for EMS transport for after 7 pm.   ?Husband updated on discharge today.   ? ? ?Final next level of care: [place] ?Barriers to Discharge: Barriers Resolved ? ? ?Patient Goals and CMS Choice ?Patient states their goals for this hospitalization and ongoing recovery are:: to go home ?CMS Medicare.gov Compare Post Acute Care list provided to:: Patient ?Choice offered to / list presented to : Patient, Spouse ? ?Discharge Placement ?  ?           ?  ?Patient to be transferred to facility by: [facility] to transport home ?Name of family member notified: [name] ?Patient and family notified of of transfer: 06/30/21 ? ?Discharge Plan and Services ?  ?Discharge Planning Services: CM Consult ?Post Acute Care Choice: Home Health, Resumption of Svcs/PTA Provider          ?DME Arranged: N/A ?DME Agency: NA ?  ?  ?  ?HH Arranged: RN, PT, OT ?[place] Agency: [place] ([place]) ?Date HH Agency Contacted: 06/30/21 ?Time [place]: 5852 ?Representative spoke with at [place]: Floydene Flock ? ?Social Determinants of Health (SDOH) Interventions ?  ? ? ?Readmission Risk Interventions ? ?  06/30/2021  ?  2:19 PM 02/23/2021  ? 12:23 PM  ?Readmission Risk Prevention Plan  ?Transportation Screening Complete Complete  ?PCP or Specialist  Appt within 3-5 Days  Complete  ?[place] or Home Care Consult  Complete  ?Social Work Consult for [place] Planning/Counseling  Complete  ?Palliative Care Screening  Not Applicable  ?Medication Review Press photographer) Complete Complete  ?PCP or Specialist appointment within 3-5 days of discharge Complete   ?[place] or Home Care Consult Complete   ?SW Recovery Care/Counseling Consult Complete   ?Palliative Care Screening Complete   ?[place] Not Applicable   ? ? ? ? ? ?

## 2021-07-01 DIAGNOSIS — J9621 Acute and chronic respiratory failure with hypoxia: Secondary | ICD-10-CM | POA: Diagnosis not present

## 2021-07-01 DIAGNOSIS — J9622 Acute and chronic respiratory failure with hypercapnia: Secondary | ICD-10-CM | POA: Diagnosis not present

## 2021-07-01 LAB — BASIC METABOLIC PANEL
Anion gap: 6 (ref 5–15)
BUN: 19 mg/dL (ref 8–23)
CO2: 26 mmol/L (ref 22–32)
Calcium: 8.5 mg/dL — ABNORMAL LOW (ref 8.9–10.3)
Chloride: 106 mmol/L (ref 98–111)
Creatinine, Ser: 0.63 mg/dL (ref 0.44–1.00)
GFR, Estimated: >60 mL/min (ref >60)
Glucose, Bld: 207 mg/dL — ABNORMAL HIGH (ref 70–99)
Potassium: 5 mmol/L (ref 3.5–5.1)
Sodium: 138 mmol/L (ref 135–145)

## 2021-07-01 LAB — CBC
HCT: 32.9 % — ABNORMAL LOW (ref 36.0–46.0)
Hemoglobin: 10.1 g/dL — ABNORMAL LOW (ref 12.0–15.0)
MCH: 27.4 pg (ref 26.0–34.0)
MCHC: 30.7 g/dL (ref 30.0–36.0)
MCV: 89.4 fL (ref 80.0–100.0)
Platelets: 137 10*3/uL — ABNORMAL LOW (ref 150–400)
RBC: 3.68 MIL/uL — ABNORMAL LOW (ref 3.87–5.11)
RDW: 14.1 % (ref 11.5–15.5)
WBC: 6.3 10*3/uL (ref 4.0–10.5)
nRBC: 0 % (ref 0.0–0.2)

## 2021-07-01 LAB — GLUCOSE, CAPILLARY
Glucose-Capillary: 125 mg/dL — ABNORMAL HIGH (ref 70–99)
Glucose-Capillary: 172 mg/dL — ABNORMAL HIGH (ref 70–99)
Glucose-Capillary: 176 mg/dL — ABNORMAL HIGH (ref 70–99)
Glucose-Capillary: 176 mg/dL — ABNORMAL HIGH (ref 70–99)
Glucose-Capillary: 181 mg/dL — ABNORMAL HIGH (ref 70–99)
Glucose-Capillary: 185 mg/dL — ABNORMAL HIGH (ref 70–99)

## 2021-07-01 LAB — PHOSPHORUS: Phosphorus: 3.6 mg/dL (ref 2.5–4.6)

## 2021-07-01 LAB — MAGNESIUM: Magnesium: 2.1 mg/dL (ref 1.7–2.4)

## 2021-07-01 NOTE — Progress Notes (Signed)
SLP Cancellation Note ? ?Patient Details ?Name: Angela David ?MRN: [id number] ?DOB: [birth year] ? ? ?Cancelled treatment:       Reason Eval/Treat Not Completed: Patient declined, no reason specified (chart reviewed; consulted pt and NSG) ?Pt declined a MBSS today to more objectively assess her swallow function when asked by NSG and this Clinician. She stated to [place] that she will think about having it done Monday, or at a later time. Recommend continue current oral care, including pleasure ice chips post oral care(following aspiration precautions) as per POC in chart. NSG agreed.  ? ? ? ? ?Orinda Kenner, MS, CCC-SLP ?Speech Language Pathologist ?Rehab Services; [place] ?[phone] (ascom) ?Chavie Kolinski ?07/01/2021, 8:36 AM ?

## 2021-07-01 NOTE — Progress Notes (Signed)
EMS back to ICU-04 with patient in respiratory distress, trach collar at 4L/31%. RT notified upon patient arrival to ICU-04.  ?

## 2021-07-01 NOTE — Progress Notes (Signed)
Angela Mola, NP and RT at bedside. Patient being placed back on vent. Per Angela Mola, NP patient will continue as ICU patient and previous orders resumed.  ?

## 2021-07-01 NOTE — Progress Notes (Addendum)
Patient is being discharged and transported home via EMS, trach collar at 4L/31%. Patient has personal belongings in the bed with her. Patient denies pain, VSS.  ?

## 2021-07-01 NOTE — Progress Notes (Addendum)
Patient admitted with Acute on Chronic Hypoxic Respiratory Failure and sepsis due to Klebsiella pneumoniae (ESBL ) UTI. She was being discharged home on 06/30/21  by EMS when she developed respiratory distress and turned blue prior to leaving the hospital. EMS immediately began bagging pt via BVM and returned pt to the unit. Patient was placed back on previous vent setting with immediate improvement noted. Discharge cancelled pending further evaluation. Patient's husband notified. ? ? ?Rufina Falco, DNP, CCRN, FNP-C, AGACNP-BC ?Acute Care & Family Nurse Practitioner  ?[facility]  ?See Amion for personal pager ?PCCM on call pager [phone] until 7 am ? ? ?

## 2021-07-01 NOTE — TOC Progression Note (Addendum)
Transition of Care (TOC) - Progression Note  ? ? ?Patient Details  ?Name: Angela David ?MRN: [id number] ?Date of Birth: 03/07/1940 ? ?Transition of Care (TOC) CM/SW Contact  ?[place], LCSWA ?Phone Number: ?07/01/2021, 11:13 AM ? ?Clinical Narrative:    ? ?11:13am: CSW called [facility]'s main number to again to follow up. CSW was told by staff they are unsure who is the on-call clinical liaison. [facility] Biochemist, clinical gave CSW their admission Education administrator informationJoie Bimler. [phone]). CSW left voicemail.  ? ?3:33pm: CSW received call from Raquel Sarna ([phone]) at [place]. She stated that they will have to start the authorization process on Monday.  ? ?Expected Discharge Plan: [place] ?Barriers to Discharge: Barriers Resolved ? ?Expected Discharge Plan and Services ?Expected Discharge Plan: [place] ?  ?Discharge Planning Services: CM Consult ?Post Acute Care Choice: Home Health, Resumption of Svcs/PTA Provider ?Living arrangements for the past 2 months: [place] ?Expected Discharge Date: 06/30/21               ?DME Arranged: N/A ?DME Agency: NA ?  ?  ?  ?HH Arranged: RN, PT, OT ?[place] Agency: [place] ([place]) ?Date HH Agency Contacted: 06/30/21 ?Time [place]: 3559 ?Representative spoke with at [place]: Floydene Flock ? ? ?Social Determinants of Health (SDOH) Interventions ?  ? ?Readmission Risk Interventions ? ?  06/30/2021  ?  2:19 PM 02/23/2021  ? 12:23 PM  ?Readmission Risk Prevention Plan  ?Transportation Screening Complete Complete  ?PCP or Specialist Appt within 3-5 Days  Complete  ?[place] or Home Care Consult  Complete  ?Social Work Consult for [place] Planning/Counseling  Complete  ?Palliative Care Screening  Not Applicable  ?Medication Review Press photographer) Complete Complete  ?PCP or Specialist appointment within 3-5 days of discharge Complete   ?[place] or Home Care Consult Complete   ?SW Recovery Care/Counseling Consult  Complete   ?Palliative Care Screening Complete   ?[place] Not Applicable   ? ? ?

## 2021-07-01 NOTE — Progress Notes (Signed)
PROGRESS NOTE  ?Patient ID: ?Angela David ?MRN: [id number] ?DOB/AGE: [birth year] 81 y.o. ? ?Admit date: 06/26/2021 ?Discharge date: 07/01/2021 ? ?Admission Diagnoses: ? ?Discharge Diagnoses:  ?Principal Problem: ?  Acute on chronic respiratory failure with hypoxia and hypercapnia (HCC) ? 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 ?  ?Patient was recently discharged from the hospital on 03/06/2021 to [facility]  following prolonged admission for Acute on Chronic Hypoxic & Respiratory Failure Secondary to suspected aspiration pneumonia, ? healthcare associated pneumonia (? COLONIZATION) and influenza A viral infection and sepsis due to aspiration pneumonia and suspected abdominal abscess. Per ED notes, patient was discharged from [facility] yesterday evening and was placed on new home vent for approximately 3 hours when she developed respiratory distress. Per patient's husband, altered, diaphoretic and unresponsive therefore EMS was called. ?  ?ED Course: In the emergency department, the temperature was 37.1?C, the heart rate 120 beats/minute, the blood pressure  109/47 mm Hg, the respiratory rate 21 breaths/minute, and the oxygen saturation 94 % on TC '@28' %.  Stat chest x-ray was obtained and showed mild right basilar atelectasis CT angio negative for acute pulmonary embolism.  CT of the abdomen and pelvis with no acute abnormality. ?Pertinent Labs in Red/Diagnostics Findings: ?Na+/ K+: 8/3.9 ?Glucose: 235 ?BUN/Cr.:  33/0.86 ?  ?WBC/ TMAX: 13.5/ afebrile ?PCT: negative <0.10 ?Lactic acid: 2.0 ?COVID PCR: Negative ?Arterial Blood Gas result:  pO2 62; pCO2 47; pH 7.49;  HCO3 35.8, %O2 Sat 95.7.  ?   ?Patient was placed on the ventilator with a tidal volume of 450 and rate of 24.  She received IV fluids and was started on IV antibiotics for possible urosepsis.  PCCM consulted for vent management ?  ? ?  ?Significant Hospital Events   ?5/2: Admitted to the ICU with possible sepsis secondary to UTI and chronic vent management ?5/3: Weaned off vent to trach collar.  Eating and drinking.  Will trial home ventilator/trilogy at night.  Urine culture positive for Klebsiella pneumoniae, sensitivities pending.  [facility] evaluating home resources in anticipation of discharge soon ?5/4: Urine culture sensitivities positive for ESBL Klebsiella pneumoniae UTI.  Antibiotics changed to ciprofloxacin.  Patient with soft blood pressure responsive to IV fluids, suspect dehydration due to poor p.o. intake.  Became hypoxic when working with PT.  Not ready for discharge at this time ? ?5/5 Patient admitted with Acute on Chronic Hypoxic Respiratory Failure and sepsis due to Klebsiella pneumoniae (ESBL ) UTI. She was being discharged home on 06/30/21  by EMS when she developed respiratory distress and turned blue prior to leaving the hospital. EMS immediately began bagging pt via BVM and returned pt to the unit. Patient was placed back on previous vent setting with immediate improvement noted. Discharge cancelled pending further evaluation. Patient's husband notified. ?  ?Consults:  ?PCCM ?  ?Procedures:  ?None ?  ?Significant Diagnostic Tests:  ?5/2: CTA abdomen and pelvis> no abnormality ?5/2: CTA Chest> no evidence of acute pulmonary embolism ?  ?Micro Data:  ?5/2: Blood culture x2> ?5/2: Urine Culture> Klebsiella pneumoniae (ESBL) ~sensitive to ciprofloxacin, gentamicin, imipenem, Zosyn ?5/2: MRSA PCR>> negative ?  ?Antimicrobials:  ?Ceftriaxone 5/2>5/4 ?Ciprofloxacin 5/4>> ?  ?OBJECTIVE  ?BP 133/60  Pulse 77   Temp 98.1 ?F (36.7 ?C) (Oral)   Resp 17   Ht '5\' 4"'  (1.626 m)   Wt 74.9 kg   SpO2 100%   BMI 28.34 kg/m?  ? ? ?>   ?Vent Mode: AC ?FiO2 (%):  [28 %] 28 % ?Set Rate:  [15 bmp] 15 bmp ?Vt Set:  [450 mL] 450 mL ?PEEP:  [5 cmH20] 5 cmH20  ?  ?  ?Intake/Output Summary (Last 24 hours) at 06/29/2021 1024 ?Last data filed at 06/29/2021 0000 ?   ?Gross per 24 hour  ?Intake 337 ml  ?Output 300 ml  ?Net 37 ml  ?  ? ? ? ? ? ?Review of Systems: ?Gen:  Denies  fever, sweats, chills weight loss  ?HEENT: Denies blurred vision, double vision, ear pain, eye pain, hearing loss, nose bleeds, sore throat ?Cardiac:  No dizziness, chest pain or heaviness, chest tightness,edema, No JVD ?Resp:   No cough, -sputum production, -shortness of breath,-wheezing, -hemoptysis,  ?Other:  All other systems negative ? ? ?Physical Examination:  ? ?General Appearance: No distress  ?EYES PERRLA, EOM intact.   ?NECK Supple, No JVD s/ p trach ?Pulmonary: normal breath sounds, No wheezing.  ?CardiovascularNormal S1,S2.  No m/r/g.   ?Abdomen: Benign, Soft, non-tender. ?Skin:   warm, no rashes, no ecchymosis  ?Extremities: normal, no cyanosis, clubbing. ?Neuro:without focal findings,  speech normal  ?PSYCHIATRIC: Mood, affect within normal limits. ? ? ?ALL OTHER ROS ARE NEGATIVE ?  ?Labs/imaging that I havepersonally reviewed  ?(right click and "Reselect all SmartList Selections" daily)  ? ? CBC ?   ?Component Value Date/Time  ? WBC 6.3 07/01/2021 0429  ? RBC 3.68 (L) 07/01/2021 0429  ? HGB 10.1 (L) 07/01/2021 0429  ? HGB 14.4 12/21/2015 1040  ? HCT 32.9 (L) 07/01/2021 0429  ? HCT 42.4 12/21/2015 1040  ? PLT 137 (L) 07/01/2021 0429  ? PLT 241 12/21/2015 1040  ? MCV 89.4 07/01/2021 0429  ? MCV 87 12/21/2015 1040  ? MCV 82 12/17/2011 0650  ? MCH 27.4 07/01/2021 0429  ? MCHC 30.7 07/01/2021 0429  ? RDW 14.1 07/01/2021 0429  ? RDW 12.5 12/21/2015 1040  ? RDW 13.3 12/17/2011 0650  ? LYMPHSABS 1.2 06/26/2021 2209  ? LYMPHSABS 1.1 12/21/2015 1040  ? LYMPHSABS 0.9 (L) 12/17/2011 0650  ? MONOABS 0.5 06/26/2021 2209  ? MONOABS 0.1 (L) 12/17/2011 0650  ? EOSABS 0.1 06/26/2021 2209  ?  EOSABS 0.1 12/21/2015 1040  ? EOSABS 0.0 12/17/2011 0650  ? BASOSABS 0.0 06/26/2021 2209  ? BASOSABS 0.0 12/21/2015 1040  ? BASOSABS 0.0 12/17/2011 0650  ? ? ?  ? ?  Latest Ref Rng & Units 07/01/2021  ?  4:29 AM 06/30/2021  ?  4:09 AM 06/29/2021  ?  3:36 AM  ?BMP  ?Glucose 70 - 99 mg/dL 207   117   100    ?BUN 8 - 23 mg/dL '19   23   24    ' ?Creatinine 0.44 - 1.00 mg/dL 0.63   0.71   0.72    ?Sodium 135 - 145 mmol/L 138   141   138    ?Potassium 3.5 - 5.1 mmol/L 5.0   3.6   3.5    ?Chloride 98 - 111 mmol/L 106   108   106    ?CO2 22 - 32 mmol/L '26   24   26    ' ?Calcium 8.9 - 10.3 mg/dL 8.5   8.5   8.5    ? ? ?   ?  ?  Allergies ?     ?Allergies  ?Allergen Reactions  ? Shellfish Allergy Shortness Of Breath  ?    respitatory distress   ? Sodium Sulfite Shortness Of Breath  ?    Severe Congestion that creates inability to breath  ? Sulfa Antibiotics Shortness Of Breath and Other (See Comments)  ?    respitatory distress  ? Codeine Other (See Comments)  ?    Per patient "it kept her up all night"  ? Glucerna [Alimentum] Itching  ? Wound Dressing Adhesive Rash  ?  ?  ?Home Medications  ?       ?Prior to Admission medications   ?Medication Sig Start Date End Date Taking? Authorizing Provider  ?acetaminophen (TYLENOL) 325 MG tablet Place 2 tablets (650 mg total) into feeding tube every 6 (six) hours as needed for fever. 03/06/21     Bradly Bienenstock, NP  ?acetaminophen (TYLENOL) 650 MG suppository Place 1 suppository (650 mg total) rectally every 4 (four) hours as needed for fever. 03/06/21     Bradly Bienenstock, NP  ?Chlorhexidine Gluconate Cloth 2 % PADS Apply 6 each topically daily at 6 (six) AM. 03/07/21     Bradly Bienenstock, NP  ?chlorhexidine gluconate, MEDLINE KIT, (PERIDEX) 0.12 % solution 15 mLs by Mouth Rinse route 2 (two) times daily. 03/06/21     Bradly Bienenstock, NP  ?diphenhydrAMINE (BENADRYL) 25 mg capsule Take 1 capsule (25 mg total) by mouth every 6 (six) hours as needed for allergies or itching. 03/06/21     Bradly Bienenstock, NP  ?docusate (COLACE) 50 MG/5ML liquid Place 10 mLs (100 mg total) into feeding tube daily as needed for mild constipation. 03/06/21     Bradly Bienenstock, NP  ?enoxaparin (LOVENOX) 40 MG/0.4ML injection [place]

## 2021-07-01 NOTE — Progress Notes (Signed)
Patient placed on ATC @ 28% FIO2, tolerating well ?

## 2021-07-01 NOTE — Plan of Care (Signed)
Continuing with plan of care. 

## 2021-07-01 NOTE — TOC Progression Note (Signed)
Transition of Care (TOC) - Progression Note  ? ? ?Patient Details  ?Name: Angela David ?MRN: [id number] ?Date of Birth: [birth year] ? ?Transition of Care (TOC) CM/SW Contact  ?[place], LCSWA ?Phone Number: ?07/01/2021, 9:30 AM ? ?Clinical Narrative:    ? ?CSW informed by provider that patient was recently discharged from [facility] and would benefit from their services. CSW called [facility] [phone]) and was told they are unsure exactly who CSW needs to contact. CSW gave [facility] staff a call back number and is awaiting a call back. ? ?  ? ? ?Expected Discharge Plan: [place] ?Barriers to Discharge: Barriers Resolved ? ?Expected Discharge Plan and Services ?Expected Discharge Plan: [place] ?  ?Discharge Planning Services: CM Consult ?Post Acute Care Choice: Home Health, Resumption of Svcs/PTA Provider ?Living arrangements for the past 2 months: [place] ?Expected Discharge Date: 06/30/21               ?DME Arranged: N/A ?DME Agency: NA ?  ?  ?  ?HH Arranged: RN, PT, OT ?[place] Agency: [place] ([place]) ?Date HH Agency Contacted: 06/30/21 ?Time [place]: 7858 ?Representative spoke with at [place]: Floydene Flock ? ? ?Social Determinants of Health (SDOH) Interventions ?  ? ?Readmission Risk Interventions ? ?  06/30/2021  ?  2:19 PM 02/23/2021  ? 12:23 PM  ?Readmission Risk Prevention Plan  ?Transportation Screening Complete Complete  ?PCP or Specialist Appt within 3-5 Days  Complete  ?[place] or Home Care Consult  Complete  ?Social Work Consult for [place] Planning/Counseling  Complete  ?Palliative Care Screening  Not Applicable  ?Medication Review Press photographer) Complete Complete  ?PCP or Specialist appointment within 3-5 days of discharge Complete   ?[place] or Home Care Consult Complete   ?SW Recovery Care/Counseling Consult Complete   ?Palliative Care Screening Complete   ?[place] Not Applicable   ? ? ?

## 2021-07-02 LAB — GLUCOSE, CAPILLARY
Glucose-Capillary: 206 mg/dL — ABNORMAL HIGH (ref 70–99)
Glucose-Capillary: 143 mg/dL — ABNORMAL HIGH (ref 70–99)
Glucose-Capillary: 151 mg/dL — ABNORMAL HIGH (ref 70–99)
Glucose-Capillary: 224 mg/dL — ABNORMAL HIGH (ref 70–99)
Glucose-Capillary: 170 mg/dL — ABNORMAL HIGH (ref 70–99)
Glucose-Capillary: 166 mg/dL — ABNORMAL HIGH (ref 70–99)
Glucose-Capillary: 206 mg/dL — ABNORMAL HIGH (ref 70–99)

## 2021-07-02 LAB — CBC
HCT: 33.7 % — ABNORMAL LOW (ref 36.0–46.0)
Hemoglobin: 10.3 g/dL — ABNORMAL LOW (ref 12.0–15.0)
MCH: 26.9 pg (ref 26.0–34.0)
MCHC: 30.6 g/dL (ref 30.0–36.0)
MCV: 88 fL (ref 80.0–100.0)
Platelets: 143 10*3/uL — ABNORMAL LOW (ref 150–400)
RBC: 3.83 MIL/uL — ABNORMAL LOW (ref 3.87–5.11)
RDW: 14.1 % (ref 11.5–15.5)
WBC: 6.6 10*3/uL (ref 4.0–10.5)
nRBC: 0 % (ref 0.0–0.2)

## 2021-07-02 LAB — BASIC METABOLIC PANEL
Anion gap: 10 (ref 5–15)
BUN: 19 mg/dL (ref 8–23)
CO2: 24 mmol/L (ref 22–32)
Calcium: 8.8 mg/dL — ABNORMAL LOW (ref 8.9–10.3)
Chloride: 103 mmol/L (ref 98–111)
Creatinine, Ser: 0.6 mg/dL (ref 0.44–1.00)
GFR, Estimated: >60 mL/min (ref >60)
Glucose, Bld: 180 mg/dL — ABNORMAL HIGH (ref 70–99)
Potassium: 4.9 mmol/L (ref 3.5–5.1)
Sodium: 137 mmol/L (ref 135–145)

## 2021-07-02 LAB — PHOSPHORUS: Phosphorus: 2.9 mg/dL (ref 2.5–4.6)

## 2021-07-02 LAB — MAGNESIUM: Magnesium: 1.8 mg/dL (ref 1.7–2.4)

## 2021-07-02 MED ORDER — MAGNESIUM SULFATE 2 GM/50ML IV SOLN
2.0000 g | Freq: Once | INTRAVENOUS | Status: AC
  Administered 2021-07-02: 2 g via INTRAVENOUS
  Filled 2021-07-02: qty 50

## 2021-07-02 NOTE — Progress Notes (Signed)
PROGRESS NOTE  ?Patient ID: ?Angela David ?MRN: [id number] ?DOB/AGE: 1941-01-02 81 y.o. ? ?Admit date: 06/26/2021 ?Discharge date: 07/02/2021 ? ?Admission Diagnoses: ? ?Discharge Diagnoses:  ?Principal Problem: ?  Acute on chronic respiratory failure with hypoxia and hypercapnia (HCC) ? 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 ?  ?Patient was recently discharged from the hospital on 03/06/2021 to [facility]  following prolonged admission for Acute on Chronic Hypoxic & Respiratory Failure Secondary to suspected aspiration pneumonia, ? healthcare associated pneumonia (? COLONIZATION) and influenza A viral infection and sepsis due to aspiration pneumonia and suspected abdominal abscess. Per ED notes, patient was discharged from [facility] yesterday evening and was placed on new home vent for approximately 3 hours when she developed respiratory distress. Per patient's husband, altered, diaphoretic and unresponsive therefore EMS was called. ?  ?ED Course: In the emergency department, the temperature was 37.1?C, the heart rate 120 beats/minute, the blood pressure  109/47 mm Hg, the respiratory rate 21 breaths/minute, and the oxygen saturation 94 % on TC '@28' %.  Stat chest x-ray was obtained and showed mild right basilar atelectasis CT angio negative for acute pulmonary embolism.  CT of the abdomen and pelvis with no acute abnormality. ?Pertinent Labs in Red/Diagnostics Findings: ?Na+/ K+: 8/3.9 ?Glucose: 235 ?BUN/Cr.:  33/0.86 ?  ?WBC/ TMAX: 13.5/ afebrile ?PCT: negative <0.10 ?Lactic acid: 2.0 ?COVID PCR: Negative ?Arterial Blood Gas result:  pO2 62; pCO2 47; pH 7.49;  HCO3 35.8, %O2 Sat 95.7.  ?   ?Patient was placed on the ventilator with a tidal volume of 450 and rate of 24.  She received IV fluids and was started on IV antibiotics for possible urosepsis.  PCCM consulted for vent management ?  ? ?  ?Significant Hospital Events   ?5/2: Admitted to the ICU with possible sepsis secondary to UTI and chronic vent management ?5/3: Weaned off vent to trach collar.  Eating and drinking.  Will trial home ventilator/trilogy at night.  Urine culture positive for Klebsiella pneumoniae, sensitivities pending.  Adapt evaluating home resources in anticipation of discharge soon ?5/4: Urine culture sensitivities positive for ESBL Klebsiella pneumoniae UTI.  Antibiotics changed to ciprofloxacin.  Patient with soft blood pressure responsive to IV fluids, suspect dehydration due to poor p.o. intake.  Became hypoxic when working with PT.  Not ready for discharge at this time ? ?5/5 Patient admitted with Acute on Chronic Hypoxic Respiratory Failure and sepsis due to Klebsiella pneumoniae (ESBL ) UTI. She was being discharged home on 06/30/21  by EMS when she developed respiratory distress and turned blue prior to leaving the hospital. EMS immediately began bagging pt via BVM and returned pt to the unit. Patient was placed back on previous vent setting with immediate improvement noted. Discharge cancelled pending further evaluation. Patient's husband notified. ? ?5/7 alert and awake, vent at night ?  ?Consults:  ?PCCM ?  ?Procedures:  ?None ?  ?Significant Diagnostic Tests:  ?5/2: CTA abdomen and pelvis> no abnormality ?5/2: CTA Chest> no evidence of acute pulmonary embolism ?  ?Micro Data:  ?5/2: Blood culture x2> ?5/2: Urine Culture> Klebsiella pneumoniae (ESBL) ~sensitive to ciprofloxacin, gentamicin, imipenem, Zosyn ?5/2: MRSA PCR>> negative ?  ?Antimicrobials:  ?Ceftriaxone 5/2>5/4 ?Ciprofloxacin  5/4>> ?  ? ?EVENTS OVERNIGHT ?VENT AT NIGHT ?NO ACUTE ISSUES ?TOLERATES TCT DURING THE  DAY ? ? ? ? ? ? ? ? ? ? ? ? ? ? ? ? ? ?OBJECTIVE  ?BP (!) 130/52   Pulse 88   Temp 98.8 ?F (37.1 ?C) (Oral)   Resp (!) 22   Ht '5\' 4"'  (1.626 m)   Wt 75.1 kg   SpO2 97%   BMI 28.42 kg/m?  ? ? ?>  ?Vent Mode: AC ?FiO2 (%):  [28 %] 28 % ?Set Rate:  [15 bmp] 15 bmp ?Vt Set:  [450 mL] 450 mL ?PEEP:  [5 cmH20] 5 cmH20  ?  ?  ?Intake/Output Summary (Last 24 hours) at 06/29/2021 1024 ?Last data filed at 06/29/2021 0000 ?   ?Gross per 24 hour  ?Intake 337 ml  ?Output 300 ml  ?Net 37 ml  ?  ? ? ? ? ?Review of Systems: ?Gen:  Denies  fever, sweats, chills weight loss  ?HEENT: Denies blurred vision, double vision, ear pain, eye pain, hearing loss, nose bleeds, sore throat ?Cardiac:  No dizziness, chest pain or heaviness, chest tightness,edema, No JVD ?Resp:   No cough, -sputum production, -shortness of breath,-wheezing, -hemoptysis,  ?Other:  All other systems negative ? ? ?Physical Examination:  ? ?General Appearance: No distress  ?EYES PERRLA, EOM intact.   ?NECK Supple, No JVD s/p trach ?Pulmonary: normal breath sounds, No wheezing.  ?CardiovascularNormal S1,S2.  No m/r/g.   ?Abdomen: Benign, Soft, non-tender. ?Skin:   warm, no rashes, no ecchymosis  ?Extremities: normal, no cyanosis, clubbing. ?Neuro:without focal findings,  speech normal  ?PSYCHIATRIC: Mood, affect within normal limits. ? ? ?ALL OTHER ROS ARE NEGATIVE ? ? ? ?ALL OTHER ROS ARE NEGATIVE ?  ?Labs/imaging that I havepersonally reviewed  ?(right click and "Reselect all SmartList Selections" daily)  ? ? CBC ?   ?Component Value Date/Time  ? WBC 6.6 07/02/2021 0538  ? RBC 3.83 (L) 07/02/2021 0538  ? HGB 10.3 (L) 07/02/2021 0538  ? HGB 14.4 12/21/2015 1040  ? HCT 33.7 (L) 07/02/2021 0538  ? HCT 42.4 12/21/2015 1040  ? PLT 143 (L) 07/02/2021 0538  ? PLT 241 12/21/2015 1040  ? MCV 88.0 07/02/2021 0538  ? MCV 87 12/21/2015 1040  ? MCV 82 12/17/2011 0650  ? MCH 26.9 07/02/2021 0538  ? MCHC 30.6 07/02/2021 0538  ? RDW 14.1 07/02/2021 0538  ? RDW 12.5 12/21/2015 1040   ? RDW 13.3 12/17/2011 0650  ? LYMPHSABS 1.2 06/26/2021 2209  ? LYMPHSABS 1.1 12/21/2015 1040  ? LYMPHSABS 0.9 (L) 12/17/2011 0650  ? MONOABS 0.5 06/26/2021 2209  ? MONOABS 0.1 (L) 12/17/2011 0650  ? EOSABS 0.1 06/26/2021 2209  ? EOSABS 0.1 12/21/2015 1040  ? EOSABS 0.0 12/17/2011 0650  ? BASOSABS 0.0 06/26/2021 2209  ? BASOSABS 0.0 12/21/2015 1040  ? BASOSABS 0.0 12/17/2011 0650  ? ? ?  ? ?  Latest Ref Rng & Units 07/02/2021  ?  5:10 AM 07/01/2021  ?  4:29 AM 06/30/2021  ?  4:09 AM  ?BMP  ?Glucose 70 - 99 mg/dL 180   207   117    ?BUN 8 - 23 mg/dL '19   19   23    ' ?Creatinine 0.44 - 1.00 mg/dL 0.60   0.63   0.71    ?Sodium 135 - 145 mmol/L 137   138   141    ?Potassium 3.5 - 5.1 mmol/L 4.9   5.0  3.6    ?Chloride 98 - 111 mmol/L 103   106   108    ?CO2 22 - 32 mmol/L '24   26   24    ' ?Calcium 8.9 - 10.3 mg/dL 8.8   8.5   8.5    ? ? ?   ?  ?Allergies ?     ?Allergies  ?Allergen Reactions  ? Shellfish Allergy Shortness Of Breath  ?    respitatory distress   ? Sodium Sulfite Shortness Of Breath  ?    Severe Congestion that creates inability to breath  ? Sulfa Antibiotics Shortness Of Breath and Other (See Comments)  ?    respitatory distress  ? Codeine Other (See Comments)  ?    Per patient "it kept her up all night"  ? Glucerna [Alimentum] Itching  ? Wound Dressing Adhesive Rash  ?  ?  ?Home Medications  ?       ?Prior to Admission medications   ?Medication Sig Start Date End Date Taking? Authorizing Provider  ?acetaminophen (TYLENOL) 325 MG tablet Place 2 tablets (650 mg total) into feeding tube every 6 (six) hours as needed for fever. 03/06/21     Bradly Bienenstock, NP  ?acetaminophen (TYLENOL) 650 MG suppository Place 1 suppository (650 mg total) rectally every 4 (four) hours as needed for fever. 03/06/21     Bradly Bienenstock, NP  ?Chlorhexidine Gluconate Cloth 2 % PADS Apply 6 each topically daily at 6 (six) AM. 03/07/21     Bradly Bienenstock, NP  ?chlorhexidine gluconate, MEDLINE KIT, (PERIDEX) 0.12 % solution 15 mLs by  Mouth Rinse route 2 (two) times daily. 03/06/21     Bradly Bienenstock, NP  ?diphenhydrAMINE (BENADRYL) 25 mg capsule Take 1 capsule (25 mg total) by mouth every 6 (six) hours as needed for allergies or itching. 03/06/21     Bradly Bienenstock, NP  ?docusate (C

## 2021-07-02 NOTE — Progress Notes (Signed)
PHARMACY CONSULT NOTE ? ?Pharmacy Consult for Electrolyte Monitoring and Replacement  ? ?Recent Labs: ?Potassium (mmol/L)  ?Date Value  ?07/02/2021 4.9  ?12/17/2011 4.1  ? ?Magnesium (mg/dL)  ?Date Value  ?07/02/2021 1.8  ?12/17/2011 2.2  ? ?Calcium (mg/dL)  ?Date Value  ?07/02/2021 8.8 (L)  ? ?Calcium, Total (mg/dL)  ?Date Value  ?12/17/2011 8.8  ? ?Albumin (g/dL)  ?Date Value  ?06/26/2021 3.1 (L)  ?08/28/2016 4.3  ?12/16/2011 4.1  ? ?Phosphorus (mg/dL)  ?Date Value  ?07/02/2021 2.9  ?12/16/2011 2.2 (L)  ? ?Sodium (mmol/L)  ?Date Value  ?07/02/2021 137  ?08/28/2016 143  ?12/17/2011 139  ? ?Assessment: ?81 year old female admitted with acute respiratory failure 2/2 sepsis, UTI growing ESBL Klebsiella pneumoniae. PMH tracheal stenosis requiring chronic tracheostomy, breast cancer (2002), T2DM, OA, PEG tube.  ? ?Nutrition: Tube feeds + free water flushes 120 mL 4x/day ? ?Goal of Therapy:  ?Electrolytes within normal limits ? ?Plan:  ?--Mg 1.8, IV magnesium sulfate 2 g x 1 ?--Follow-up electrolytes with AM labs tomorrow ? ?Damico Partin A Oretta Berkland ?07/02/2021 ?8:09 AM ? ?

## 2021-07-02 NOTE — Progress Notes (Signed)
Trach suctioned x 2. Tolerated Well. ?

## 2021-07-02 NOTE — Progress Notes (Signed)
PT Cancellation Note ? ?Patient Details ?Name: Angela David ?MRN: [id number] ?DOB: 06/24/1940 ? ? ?Cancelled Treatment:    Reason Eval/Treat Not Completed: Other (comment) ? ?Pt awake and talking with RN upon arrival.  Encouraged participation in PT and reviewed risks/benefits but she declined session.  Reviewed she has not had PT since Thursday but she continued to decline and remains firm. ? ? ?Chesley Noon ?07/02/2021, 12:34 PM ?

## 2021-07-02 NOTE — Progress Notes (Signed)
Pt requested to be placed on ATC and have her passey muir placed on. Cuff deflated, passy muir placed on, and 28% ATC placed on patient. ?

## 2021-07-02 NOTE — Plan of Care (Signed)
Continuing with plan of care. 

## 2021-07-03 ENCOUNTER — Ambulatory Visit: Payer: Medicare Other | Admitting: Neurology

## 2021-07-03 ENCOUNTER — Other Ambulatory Visit (HOSPITAL_COMMUNITY): Payer: Self-pay

## 2021-07-03 ENCOUNTER — Encounter: Payer: Self-pay | Admitting: Internal Medicine

## 2021-07-03 ENCOUNTER — Other Ambulatory Visit: Payer: Self-pay

## 2021-07-03 DIAGNOSIS — J9622 Acute and chronic respiratory failure with hypercapnia: Secondary | ICD-10-CM | POA: Diagnosis not present

## 2021-07-03 DIAGNOSIS — J9621 Acute and chronic respiratory failure with hypoxia: Secondary | ICD-10-CM | POA: Diagnosis not present

## 2021-07-03 LAB — CBC
HCT: 33.7 % — ABNORMAL LOW (ref 36.0–46.0)
Hemoglobin: 10.2 g/dL — ABNORMAL LOW (ref 12.0–15.0)
MCH: 26.9 pg (ref 26.0–34.0)
MCHC: 30.3 g/dL (ref 30.0–36.0)
MCV: 88.9 fL (ref 80.0–100.0)
Platelets: 170 10*3/uL (ref 150–400)
RBC: 3.79 MIL/uL — ABNORMAL LOW (ref 3.87–5.11)
RDW: 14 % (ref 11.5–15.5)
WBC: 6.6 10*3/uL (ref 4.0–10.5)
nRBC: 0 % (ref 0.0–0.2)

## 2021-07-03 LAB — GLUCOSE, CAPILLARY
Glucose-Capillary: 144 mg/dL — ABNORMAL HIGH (ref 70–99)
Glucose-Capillary: 150 mg/dL — ABNORMAL HIGH (ref 70–99)
Glucose-Capillary: 163 mg/dL — ABNORMAL HIGH (ref 70–99)
Glucose-Capillary: 196 mg/dL — ABNORMAL HIGH (ref 70–99)
Glucose-Capillary: 208 mg/dL — ABNORMAL HIGH (ref 70–99)
Glucose-Capillary: 210 mg/dL — ABNORMAL HIGH (ref 70–99)

## 2021-07-03 LAB — BASIC METABOLIC PANEL
Anion gap: 5 (ref 5–15)
BUN: 21 mg/dL (ref 8–23)
CO2: 32 mmol/L (ref 22–32)
Calcium: 9.2 mg/dL (ref 8.9–10.3)
Chloride: 100 mmol/L (ref 98–111)
Creatinine, Ser: 0.68 mg/dL (ref 0.44–1.00)
GFR, Estimated: >60 mL/min (ref >60)
Glucose, Bld: 224 mg/dL — ABNORMAL HIGH (ref 70–99)
Potassium: 4.7 mmol/L (ref 3.5–5.1)
Sodium: 137 mmol/L (ref 135–145)

## 2021-07-03 LAB — MAGNESIUM: Magnesium: 1.9 mg/dL (ref 1.7–2.4)

## 2021-07-03 LAB — PHOSPHORUS: Phosphorus: 3.2 mg/dL (ref 2.5–4.6)

## 2021-07-03 MED ORDER — GUAIFENESIN 100 MG/5ML PO LIQD
15.0000 mL | Freq: Three times a day (TID) | ORAL | Status: AC
  Administered 2021-07-03 – 2021-07-05 (×6): 15 mL
  Filled 2021-07-03 (×6): qty 20

## 2021-07-03 MED ORDER — ALBUTEROL SULFATE (2.5 MG/3ML) 0.083% IN NEBU
2.5000 mg | INHALATION_SOLUTION | Freq: Four times a day (QID) | RESPIRATORY_TRACT | 0 refills | Status: DC | PRN
  Filled 2021-07-03: qty 90, 8d supply, fill #0

## 2021-07-03 MED ORDER — SODIUM CHLORIDE 3 % IN NEBU
4.0000 mL | INHALATION_SOLUTION | Freq: Two times a day (BID) | RESPIRATORY_TRACT | Status: DC
  Administered 2021-07-03 – 2021-07-05 (×5): 4 mL via RESPIRATORY_TRACT
  Filled 2021-07-03 (×6): qty 4

## 2021-07-03 MED ORDER — IPRATROPIUM BROMIDE 0.02 % IN SOLN
0.5000 mg | Freq: Four times a day (QID) | RESPIRATORY_TRACT | 0 refills | Status: DC | PRN
  Filled 2021-07-03: qty 90, 9d supply, fill #0

## 2021-07-03 MED ORDER — GUAIFENESIN 100 MG/5ML PO LIQD: 5.0000 mL | Freq: Three times a day (TID) | ORAL | Status: DC

## 2021-07-03 NOTE — TOC Progression Note (Signed)
Transition of Care (TOC) - Progression Note  ? ? ?Patient Details  ?Name: Angela David ?MRN: [id number] ?Date of Birth: [birth year] ? ?Transition of Care (TOC) CM/SW Contact  ?Shelbie Hutching, RN ?Phone Number: ?07/03/2021, 9:02 AM ? ?Clinical Narrative:    ?Spoke with Raquel Sarna from [facility] this morning about patient possibly returning.  Raquel Sarna will talk with management of [facility] and get back to me if patient can return, she is traditional Medicare so will not need insurance authorization.   ? ? ?Expected Discharge Plan: [place] ?Barriers to Discharge: Barriers Resolved ? ?Expected Discharge Plan and Services ?Expected Discharge Plan: [place] ?  ?Discharge Planning Services: CM Consult ?Post Acute Care Choice: Home Health, Resumption of Svcs/PTA Provider ?Living arrangements for the past 2 months: [place] ?Expected Discharge Date: 06/30/21               ?DME Arranged: N/A ?DME Agency: NA ?  ?  ?  ?HH Arranged: RN, PT, OT ?[place] Agency: [place] ([place]) ?Date HH Agency Contacted: 06/30/21 ?Time [place]: [id number] ?Representative spoke with at [place]: Floydene Flock ? ? ?Social Determinants of Health (SDOH) Interventions ?  ? ?Readmission Risk Interventions ? ?  06/30/2021  ?  2:19 PM 02/23/2021  ? 12:23 PM  ?Readmission Risk Prevention Plan  ?Transportation Screening Complete Complete  ?PCP or Specialist Appt within 3-5 Days  Complete  ?[place] or Home Care Consult  Complete  ?Social Work Consult for [place] Planning/Counseling  Complete  ?Palliative Care Screening  Not Applicable  ?Medication Review Press photographer) Complete Complete  ?PCP or Specialist appointment within 3-5 days of discharge Complete   ?[place] or Home Care Consult Complete   ?SW Recovery Care/Counseling Consult Complete   ?Palliative Care Screening Complete   ?[place] Not Applicable   ? ? ?

## 2021-07-03 NOTE — Progress Notes (Signed)
Husband [name] called and asked to bring in home ventilator equipment. Updated on pt status. Per Wille Glaser he will bring the ventilator this afternoon.  ?

## 2021-07-03 NOTE — Progress Notes (Signed)
Physical Therapy Treatment ?Patient Details ?Name: Angela David ?MRN: [id number] ?DOB: 1940-10-25 ?Today's Date: 07/03/2021 ? ? 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 Patient just discharged from [facility] to home. ? ?  ?PT Comments  ? ? Initially declines but agrees to supine HEP with encouragement.  Stated she does do HEP on her own and is encouraged to continue.  She hopes to transition back to [facility] today.  ?Recommendations for follow up therapy are one component of a multi-disciplinary discharge planning process, led by the attending physician.  Recommendations may be updated based on patient status, additional functional criteria and insurance authorization. ? ?Follow Up Recommendations ? PT at Long-term acute care hospital ?  ?  ?Assistance Recommended at Discharge    ?Patient can return home with the following   ?  ?Equipment Recommendations ?    ?  ?Recommendations for Other Services   ? ? ?  ?Precautions / Restrictions Precautions ?Precautions: Fall ?Precaution Comments: trach  ?  ? ?Mobility ? Bed Mobility ?  ?  ?  ?  ?  ?  ?  ?  ?  ? ?Transfers ?  ?  ?  ?  ?  ?  ?  ?  ?  ?  ?  ? ?Ambulation/Gait ?  ?  ?  ?  ?  ?  ?  ?  ? ? ?Stairs ?  ?  ?  ?  ?  ? ? ?Wheelchair Mobility ?  ? ?Modified Rankin (Stroke Patients Only) ?  ? ? ?  ?Balance   ?  ?  ?  ?  ?  ?  ?  ?  ?  ?  ?  ?  ?  ?  ?  ?  ?  ?  ?  ? ?  ?Cognition Arousal/Alertness: Awake/alert ?Behavior During Therapy: [facility] for tasks assessed/performed ?Overall Cognitive Status: Within Functional Limits for tasks assessed ?  ?  ?  ?  ?  ?  ?  ?  ?  ?  ?  ?  ?  ?  ?  ?  ?  ?  ?  ? ?  ?Exercises Other  Exercises ?Other Exercises: BLE AROM 2 x 10 with encouragement to do HEP on her own throught the day.  She stated she does ? ?  ?General Comments   ?  ?  ? ?Pertinent Vitals/Pain Pain Assessment ?Pain Assessment: No/denies pain  ? ? ?Home Living   ?  ?  ?  ?  ?  ?  ?  ?  ?  ?   ?  ?Prior Function    ?  ?  ?   ? ?PT Goals (current goals can now be found in the care plan section) Progress towards PT goals: Progressing toward goals ? ?  ?Frequency ? ? ? Min 2X/week ? ? ? ?  ?PT Plan Current plan remains appropriate  ? ? ?Co-evaluation   ?  ?  ?  ?  ? ?  ?AM-PAC PT "6 Clicks" Mobility   ?Outcome Measure ?   ?  ?  ?  ?  ?  ?  ? ?  ?End of  Session Equipment Utilized During Treatment: Oxygen ?Activity Tolerance: Patient tolerated treatment well ?Patient left: in bed;with call bell/phone within reach ?Nurse Communication: Mobility status ?PT Visit Diagnosis: Unsteadiness on feet (R26.81);Other abnormalities of gait and mobility (R26.89);Muscle weakness (generalized) (M62.81);Difficulty in walking, not elsewhere classified (R26.2) ?  ? ? ?Time: 0110-0349 ?PT Time Calculation (min) (ACUTE ONLY): 9 min ? ?Charges:  $Therapeutic Exercise: 8-22 mins          ?         Chesley Noon, PTA ?07/03/21, 9:29 AM ? ?

## 2021-07-03 NOTE — Progress Notes (Addendum)
Inpatient Diabetes Program Recommendations ? ?AACE/ADA: New Consensus Statement on Inpatient Glycemic Control (2015) ? ?Target Ranges:  Prepandial:   less than 140 mg/dL ?     Peak postprandial:   less than 180 mg/dL (1-2 hours) ?     Critically ill patients:  140 - 180 mg/dL  ? ? Latest Reference Range & Units 07/01/21 23:58 07/02/21 03:28 07/02/21 07:54 07/02/21 08:36 07/02/21 11:53 07/02/21 16:45 07/02/21 19:20  ?Glucose-Capillary 70 - 99 mg/dL 172 (H) 206 (H) 143 (H) 151 (H) 224 (H) 170 (H) 166 (H)  ?(H): Data is abnormally high ? Latest Reference Range & Units 07/02/21 23:25 07/03/21 03:23 07/03/21 07:18  ?Glucose-Capillary 70 - 99 mg/dL 206 (H) 208 (H) 150 (H)  ?(H): Data is abnormally high ? ? ? ? ?Home DM Meds: Regular 2-10 units Q6 hours ?       Semglee 40 units BID ? ?Current Orders: Novolog Sensitive Correction Scale/ SSI (0-9 units) Q4hours ? ? ? ?MD- Note patient having occasional CBGs >200.  Of note, pt is getting Bolus tube feeds 325 ml 4 times per day (10am, 2pm, 6pm, 10pm) ? ?May consider the following to better match CBGs and Bolus feeds: ? ?1. Change current Novolog SSi regimen/CBG checks to 4 times per day (currently Q4 hours) ?Change to 10am, 2pm, 6pm, 10pm ? ?2. Start Low dose Novolog Tube Feed Coverage for these Tube feed boluses ?Novolog 2 units 4 times per day ?10am, 2pm, 6pm, 10pm ? ? ? ?--Will follow patient during hospitalization-- ? ?Wyn Quaker RN, MSN, CDE ?Diabetes Coordinator ?Inpatient Glycemic Control Team ?Team Pager: [phone] (8a-5p) ? ? ? ? ? ? ?

## 2021-07-03 NOTE — Progress Notes (Signed)
PHARMACY CONSULT NOTE ? ?Pharmacy Consult for Electrolyte Monitoring and Replacement  ? ?Recent Labs: ?Potassium (mmol/L)  ?Date Value  ?07/03/2021 4.7  ?12/17/2011 4.1  ? ?Magnesium (mg/dL)  ?Date Value  ?07/03/2021 1.9  ?12/17/2011 2.2  ? ?Calcium (mg/dL)  ?Date Value  ?07/03/2021 9.2  ? ?Calcium, Total (mg/dL)  ?Date Value  ?12/17/2011 8.8  ? ?Albumin (g/dL)  ?Date Value  ?06/26/2021 3.1 (L)  ?08/28/2016 4.3  ?12/16/2011 4.1  ? ?Phosphorus (mg/dL)  ?Date Value  ?07/03/2021 3.2  ?12/16/2011 2.2 (L)  ? ?Sodium (mmol/L)  ?Date Value  ?07/03/2021 137  ?08/28/2016 143  ?12/17/2011 139  ? ?Assessment: ?81 year old female admitted with acute respiratory failure 2/2 sepsis, UTI growing ESBL Klebsiella pneumoniae. PMH tracheal stenosis requiring chronic tracheostomy, breast cancer (2002), T2DM, OA, PEG tube.  ? ?Nutrition: Tube feeds + free water flushes 120 mL 4x/day ? ?Goal of Therapy:  ?Electrolytes within normal limits ? ?Plan:  ?--No electrolyte replacement indicated at this time ?--Electrolytes stabilizing; will discontinue electrolyte consult at this time. Defer further ordering of labs and electrolyte replacement to PCCM ?--Pharmacy will continue to monitor peripherally ? ?Angela David ?07/03/2021 ?7:31 AM ? ?

## 2021-07-03 NOTE — Progress Notes (Signed)
? ? ?NAME:  Angela David, MRN:  [id number], DOB:  27-Mar-1940, LOS: 6 ?ADMISSION DATE:  06/26/2021, CONSULTATION DATE:  06/27/2021 ?REFERRING MD:  Pryor Curia MD  CHIEF COMPLAINT: Unresponsiveness  ? ? 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 ? ?Patient was recently discharged from the hospital on 03/06/2021 to [facility]  following prolonged admission for Acute on Chronic Hypoxic & Respiratory Failure Secondary to suspected aspiration pneumonia, ? healthcare associated pneumonia (? COLONIZATION) and influenza A viral infection and sepsis due to aspiration pneumonia and suspected abdominal abscess. Per ED notes, patient was discharged from [facility] yesterday evening and was placed on new home vent for approximately 3 hours when she developed respiratory distress. Per patient's husband, altered, diaphoretic and unresponsive therefore EMS was called. ? ?ED Course: In the emergency department, the temperature was 37.1?C, the heart rate 120 beats/minute, the blood pressure  109/47 mm Hg, the respiratory rate 21 breaths/minute, and the oxygen saturation 94 % on TC '@28'$ %.  Stat chest x-ray was obtained and showed mild right basilar atelectasis CT angio negative for acute pulmonary embolism.  CT of the abdomen and pelvis with no acute abnormality. ?Pertinent Labs in Red/Diagnostics Findings: ?Na+/ K+: 8/3.9 ?Glucose: 235 ?BUN/Cr.:  33/0.86 ?  ?WBC/ TMAX: 13.5/ afebrile ?PCT: negative <0.10 ?Lactic acid: 2.0 ?COVID PCR: Negative ?Arterial Blood Gas result:  pO2 62; pCO2 47; pH 7.49;  HCO3 35.8, %O2 Sat 95.7.  ? ?Patient was placed on the ventilator with a tidal volume of 450 and rate of 24.  She  received IV fluids and was started on IV antibiotics for possible urosepsis.  PCCM consulted for vent management ? ?Past Medical History  ? 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 ?Significant Hospital Events   ?5/2: Admitted to the ICU with possible sepsis secondary to UTI and chronic vent management ?5/3: Weaned off vent to trach collar.  Eating and drinking.  Will trial home ventilator/trilogy at night.  Urine culture positive for Klebsiella pneumoniae, sensitivities pending.  [facility] evaluating home resources in anticipation of discharge soon ?5/4: Urine culture sensitivities positive for ESBL Klebsiella pneumoniae UTI.  Antibiotics changed to ciprofloxacin.  Patient with soft blood pressure responsive to IV fluids, suspect dehydration due to poor p.o. intake.  Became hypoxic when working with PT. Not ready for discharge at this time ?5/5: Patient admitted with Acute on Chronic Hypoxic Respiratory Failure and sepsis due to Klebsiella pneumoniae (ESBL ) UTI. She was being discharged home on 06/30/21  by EMS when she developed respiratory distress and turned blue prior to leaving the hospital.  EMS immediately began bagging pt via BVM and returned pt to the unit. Patient was placed back on previous vent setting with immediate improvement noted. Discharge cancelled pending further evaluation. Patient's husband notified. ?5/7: Alert and awake,  vent at night ?5/8: Working on discharge planning.  Requesting husband to bring in home ventilator for qhs use and as needed to assess if pt will tolerate  ? ?Consults:  ?PCCM ? ?Procedures:  ?None ? ?Significant Diagnostic Tests:  ?5/2: CTA abdomen and pelvis> no abnormality ?5/2: CTA Chest> no evidence of acute pulmonary embolism ? ?Micro Data:  ?5/2: Blood culture x2>negative  ?5/2: Urine Culture> Klebsiella pneumoniae ?5/2: MRSA PCR>>negative  ? ?Antimicrobials:  ?Ceftriaxone 5/2>>5/4 ?Ciprofloxacin 5/4>>5/6 ? ?OBJECTIVE  ?Blood pressure (!) 98/59, pulse 90, temperature 98 ?F (36.7 ?C), resp. rate 16, height '5\' 4"'$  (1.626 m), weight 75.1 kg, SpO2 96 %. ?   ?Vent Mode: PRVC ?FiO2 (%):  [28 %] 28 % ?Set Rate:  [15 bmp] 15 bmp ?Vt Set:  [450 mL] 450 mL ?PEEP:  [5 cmH20] 5 cmH20 ?Plateau Pressure:  [12 cmH20] 12 cmH20  ? ?Intake/Output Summary (Last 24 hours) at 07/03/2021 0956 ?Last data filed at 07/03/2021 0740 ?Gross per 24 hour  ?Intake 325.5 ml  ?Output 2600 ml  ?Net -2274.5 ml  ? ?Filed Weights  ? 06/28/21 1208 07/02/21 0341 07/03/21 0500  ?Weight: 74.9 kg 75.1 kg 75.1 kg  ? ?Physical Examination  ?GENERAL: Chronically ill appearing patient female resting in bed, NAD on mechanical ventilator via chronic tracheostomy ?HEENT: Supple, no JVD, chronic size 6 mm shiley tracheostomy midline dressing dry/intact  ?LUNGS: Clear throughout, even, non labored  ?CARDIOVASCULAR: NSR, s1s2, no r/g, 2+ radial/1+ distal pulses, no edema  ?ABDOMEN: +BS x4, soft, nontender, nondistended. No organomegaly or mass. PEG in place.  ?EXTREMITIES: Normal bulk and tone, moves all extremities  ?SKIN: No obvious rash, lesion, or ulcer.  ? ?Labs/imaging that I havepersonally reviewed  ?(right click and "Reselect all SmartList Selections" daily)  ? ?  ?Labs   ?CBC: ?Recent Labs  ?Lab 06/26/21 ?2209 06/27/21 ?0440 06/29/21 ?7793 06/30/21 ?0409 07/01/21 ?0429 07/02/21 ?9030 07/03/21 ?0330  ?WBC 13.5*   < > 8.5 8.3 6.3 6.6 6.6  ?NEUTROABS 11.5*   --   --   --   --   --   --   ?HGB 12.4   < > 10.5* 10.5* 10.1* 10.3* 10.2*  ?HCT 40.2   < > 33.0* 33.4* 32.9* 33.7* 33.7*  ?MCV 88.5   < > 85.5 86.8 89.4 88.0 88.9  ?PLT 203   < > 150 146* 137* 143* 170  ? < > = values in this interval not displayed.  ? ? ?Basic Metabolic Panel: ?Recent Labs  ?Lab 06/29/21 ?0923 06/30/21 ?0409 07/01/21 ?0429 07/02/21 ?0510 07/03/21 ?0330  ?NA 138 141 138 137 137  ?K 3.5 3.6 5.0 4.9 4.7  ?CL 106 108 106 103 100  ?CO2 '26 24 26 24 '$ 32  ?GLUCOSE 100* 117* 207* 180* 224*  ?BUN 24* '23 19 19 21  '$ ?CREATININE 0.72 0.71 0.63 0.60 0.68  ?CALCIUM 8.5* 8.5* 8.5* 8.8* 9.2  ?MG 1.9 1.7 2.1 1.8 1.9  ?PHOS 1.4* 1.5* 3.6 2.9 3.2  ? ?GFR: ?Estimated Creatinine Clearance: 57.5 mL/min (by C-G formula based on SCr of 0.68 mg/dL). ?Recent Labs  ?Lab 06/26/21 ?2209 06/27/21 ?0056 06/27/21 ?0440 06/28/21 ?3007 06/29/21 ?6226 06/29/21 ?1225 06/29/21 ?1547 06/30/21 ?0409 07/01/21 ?0429 07/02/21 ?3335 07/03/21 ?0330  ?PROCALCITON <0.10  --  <0.10  0.16  --   --   --   --   --   --   --   ?WBC 13.5*  --  8.4 8.7   < >  --   --  8.3 6.3 6.6 6.6  ?LATICACIDVEN 2.0* 1.7  --   --   --  2.0* 1.3  --   --   --   --   ? < > = values in this interval not displayed.  ? ? ?Liver Function Tests: ?Recent Labs  ?Lab 06/26/21 ?2209  ?AST 25  ?ALT 12  ?ALKPHOS 61  ?BILITOT 0.4  ?PROT 7.6  ?ALBUMIN 3.1*  ? ?No results for input(s): LIPASE, AMYLASE in the last 168 hours. ?No results for input(s): AMMONIA in the last 168 hours. ? ?ABG ?   ?Component Value Date/Time  ? PHART 7.49 (H) 06/26/2021 2205  ? PCO2ART 47 06/26/2021 2205  ? PO2ART 62 (L) 06/26/2021 2205  ? HCO3 35.8 (H) 06/26/2021 2205  ? ACIDBASEDEF 0.9 01/19/2020 1024  ? O2SAT 95.7 06/26/2021 2205  ?  ? ?Coagulation Profile: ?No results for input(s): INR, PROTIME in the last 168 hours. ? ?Cardiac Enzymes: ?No results for input(s): CKTOTAL, CKMB, CKMBINDEX, TROPONINI in the last 168 hours. ? ?HbA1C: ?Hgb A1c MFr Bld  ?Date/Time Value Ref Range Status  ?06/27/2021 04:40 AM  6.1 (H) 4.8 - 5.6 % Final  ?  Comment:  ?  (NOTE) ?Pre diabetes:          5.7%-6.4% ? ?Diabetes:              >6.4% ? ?Glycemic control for   <7.0% ?adults with diabetes ?  ?01/20/2020 07:19 AM 6.3 (H) 4

## 2021-07-04 DIAGNOSIS — N390 Urinary tract infection, site not specified: Secondary | ICD-10-CM | POA: Diagnosis not present

## 2021-07-04 DIAGNOSIS — J9622 Acute and chronic respiratory failure with hypercapnia: Secondary | ICD-10-CM | POA: Diagnosis not present

## 2021-07-04 DIAGNOSIS — A419 Sepsis, unspecified organism: Secondary | ICD-10-CM | POA: Diagnosis not present

## 2021-07-04 DIAGNOSIS — J9621 Acute and chronic respiratory failure with hypoxia: Secondary | ICD-10-CM | POA: Diagnosis not present

## 2021-07-04 LAB — BASIC METABOLIC PANEL
Anion gap: 5 (ref 5–15)
BUN: 23 mg/dL (ref 8–23)
CO2: 32 mmol/L (ref 22–32)
Calcium: 9.8 mg/dL (ref 8.9–10.3)
Chloride: 99 mmol/L (ref 98–111)
Creatinine, Ser: 0.63 mg/dL (ref 0.44–1.00)
GFR, Estimated: >60 mL/min (ref >60)
Glucose, Bld: 230 mg/dL — ABNORMAL HIGH (ref 70–99)
Potassium: 4.7 mmol/L (ref 3.5–5.1)
Sodium: 136 mmol/L (ref 135–145)

## 2021-07-04 LAB — GLUCOSE, CAPILLARY
Glucose-Capillary: 175 mg/dL — ABNORMAL HIGH (ref 70–99)
Glucose-Capillary: 141 mg/dL — ABNORMAL HIGH (ref 70–99)
Glucose-Capillary: 226 mg/dL — ABNORMAL HIGH (ref 70–99)
Glucose-Capillary: 220 mg/dL — ABNORMAL HIGH (ref 70–99)
Glucose-Capillary: 237 mg/dL — ABNORMAL HIGH (ref 70–99)

## 2021-07-04 LAB — CBC WITH DIFFERENTIAL/PLATELET
Abs Immature Granulocytes: 0.08 10*3/uL — ABNORMAL HIGH (ref 0.00–0.07)
Basophils Absolute: 0 10*3/uL (ref 0.0–0.1)
Basophils Relative: 0 %
Eosinophils Absolute: 0.1 10*3/uL (ref 0.0–0.5)
Eosinophils Relative: 2 %
HCT: 35.4 % — ABNORMAL LOW (ref 36.0–46.0)
Hemoglobin: 11 g/dL — ABNORMAL LOW (ref 12.0–15.0)
Immature Granulocytes: 1 %
Lymphocytes Relative: 16 %
Lymphs Abs: 1.2 10*3/uL (ref 0.7–4.0)
MCH: 27 pg (ref 26.0–34.0)
MCHC: 31.1 g/dL (ref 30.0–36.0)
MCV: 87 fL (ref 80.0–100.0)
Monocytes Absolute: 0.5 10*3/uL (ref 0.1–1.0)
Monocytes Relative: 6 %
Neutro Abs: 5.7 10*3/uL (ref 1.7–7.7)
Neutrophils Relative %: 75 %
Platelets: 186 10*3/uL (ref 150–400)
RBC: 4.07 MIL/uL (ref 3.87–5.11)
RDW: 13.9 % (ref 11.5–15.5)
WBC: 7.5 10*3/uL (ref 4.0–10.5)
nRBC: 0 % (ref 0.0–0.2)

## 2021-07-04 LAB — CULTURE, BLOOD (SINGLE)
Culture: NO GROWTH
Special Requests: ADEQUATE

## 2021-07-04 LAB — MAGNESIUM: Magnesium: 1.7 mg/dL (ref 1.7–2.4)

## 2021-07-04 LAB — PHOSPHORUS: Phosphorus: 3.6 mg/dL (ref 2.5–4.6)

## 2021-07-04 MED ORDER — PREMARIN 0.625 MG/GM VA CREA: TOPICAL_CREAM | VAGINAL | 4 refills | Status: DC

## 2021-07-04 MED ORDER — MAGNESIUM SULFATE 2 GM/50ML IV SOLN
2.0000 g | Freq: Once | INTRAVENOUS | Status: AC
  Administered 2021-07-04: 2 g via INTRAVENOUS
  Filled 2021-07-04: qty 50

## 2021-07-04 NOTE — TOC Progression Note (Signed)
Transition of Care (TOC) - Progression Note  ? ? ?Patient Details  ?Name: Angela David ?MRN: [id number] ?Date of Birth: Sep 22, 1940 ? ?Transition of Care (TOC) CM/SW Contact  ?Shelbie Hutching, RN ?Phone Number: ?07/04/2021, 1:27 PM ? ?Clinical Narrative:    ?Patient and patient's husband both want her to go home  Plan for tomorrow.  RNCM has reached out to [place] with [employer], he will reach out to [facility] the respiratory therapist with [employer] to schedule a home visit with the husband before the patient goes home to make sure they have everything needed.   ? ? ?Expected Discharge Plan: [place] ?Barriers to Discharge: Barriers Resolved ? ?Expected Discharge Plan and Services ?Expected Discharge Plan: [place] ?  ?Discharge Planning Services: CM Consult ?Post Acute Care Choice: Home Health, Resumption of Svcs/PTA Provider ?Living arrangements for the past 2 months: [place] ?Expected Discharge Date: 06/30/21               ?DME Arranged: N/A ?DME Agency: NA ?  ?  ?  ?HH Arranged: RN, PT, OT ?[place] Agency: [place] ([place]) ?Date HH Agency Contacted: 06/30/21 ?Time [place]: 0160 ?Representative spoke with at [place]: Floydene Flock ? ? ?Social Determinants of Health (SDOH) Interventions ?  ? ?Readmission Risk Interventions ? ?  06/30/2021  ?  2:19 PM 02/23/2021  ? 12:23 PM  ?Readmission Risk Prevention Plan  ?Transportation Screening Complete Complete  ?PCP or Specialist Appt within 3-5 Days  Complete  ?[place] or Home Care Consult  Complete  ?Social Work Consult for [place] Planning/Counseling  Complete  ?Palliative Care Screening  Not Applicable  ?Medication Review Press photographer) Complete Complete  ?PCP or Specialist appointment within 3-5 days of discharge Complete   ?[place] or Home Care Consult Complete   ?SW Recovery Care/Counseling Consult Complete   ?Palliative Care Screening Complete   ?[place] Not Applicable   ? ? ?

## 2021-07-04 NOTE — Progress Notes (Signed)
Nutrition Follow Up Note  ? ?DOCUMENTATION CODES:  ? ?Not applicable ? ?INTERVENTION:  ? ?[employer] [place] Standard 1.4- Give 1 carton 4 times daily via tube- Flush with 27m of water before and after each feed ? ?Pro-Source 47mdaily via tube, provides 40kcal and 11g of protein per serving  ? ?Regimen provides 1860kcal/day, 91g/day protein and 141640may of free water ? ?NUTRITION DIAGNOSIS:  ? ?Inadequate oral intake related to dysphagia as evidenced by NPO status (pt with chronic G-tube). ?-ongoing  ? ?GOAL:  ? ?Patient will meet greater than or equal to 90% of their needs ?-met  ? ?MONITOR:  ? ?Labs, Weight trends, TF tolerance, Skin, I & O's, Diet advancement ? ?ASSESSMENT:  ? ?[age]ar old female with PMHx of breast cancer s/p lumpectomy, right partial mastectomy and chemo/XRT, DM, diabetic peripheral neuropathy, GERD, vocal cord paralysis, tracheal stenosis requiring chronic tracheostomy, aspiration PNA, chronic G-tube (placed surgically 01/29/2020), s/p hand-assisted laparoscopic right hemicolectomy by Dr. PabDahlia Byes 11/06/2018, s/p robotic assisted incisional ventral hernia repair on 10/18/2020 and recent admission for flu A who is now admitted with aspiraiton, UTI and sepsis. ? ?Met with pt in room today. Pt remains ventilated via trach. Pt tolerating bolus tube feeds without any issues. Refeed labs stable. No BM since 5/5; MD aware. Per chart, pt is weight stable. Pt +1.4L on her I & Os. Plan is for discharge home today with ventilator.  ? ?Medications reviewed and include: lovenox, insulin, melatonin, protonix, Mg sulfate ? ?Labs reviewed: K 4.7 wnl, P 3.6 wnl, Mg 1.7 wnl ?Cbgs- 141, 175 x 24 hrs ? ?Diet Order:   ?Diet Order   ? ?       ?  Diet NPO time specified  Diet effective now       ?  ? ?  ?  ? ?  ? ?EDUCATION NEEDS:  ? ?Education needs have been addressed ? ?Skin:  Skin Assessment: Reviewed RN Assessment ? ?Last BM:  5/5- type 6 ? ?Height:  ? ?Ht Readings from Last 1 Encounters:  ?06/28/21 '5\' 4"'  (1.626 m)   ? ? ?Weight:  ? ?Wt Readings from Last 1 Encounters:  ?07/04/21 73.4 kg  ? ? ?Ideal Body Weight:  54.5 kg ? ?BMI:  Body mass index is 27.78 kg/m?. ? ?Estimated Nutritional Needs:  ? ?Kcal:  1700-1900kcal/day ? ?Protein:  85-95g/day ? ?Fluid:  1.4-1.6L/day ? ?CasKoleen Distance, RD, LDN ?Please refer to [facility]ION for RD and/or RD on-call/weekend/after hours pager ? ?

## 2021-07-04 NOTE — Progress Notes (Signed)
? ? ?NAME:  Angela David, MRN:  [id number], DOB:  1940-10-23, LOS: 7 ?ADMISSION DATE:  06/26/2021, CONSULTATION DATE:  06/27/2021 ?REFERRING MD:  Pryor Curia MD  CHIEF COMPLAINT: Unresponsiveness  ? ? 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 ? ?Patient was recently discharged from the hospital on 03/06/2021 to [facility]  following prolonged admission for Acute on Chronic Hypoxic & Respiratory Failure Secondary to suspected aspiration pneumonia, ? healthcare associated pneumonia (? COLONIZATION) and influenza A viral infection and sepsis due to aspiration pneumonia and suspected abdominal abscess. Per ED notes, patient was discharged from [facility] yesterday evening and was placed on new home vent for approximately 3 hours when she developed respiratory distress. Per patient's husband, altered, diaphoretic and unresponsive therefore EMS was called. ? ?ED Course: In the emergency department, the temperature was 37.1?C, the heart rate 120 beats/minute, the blood pressure  109/47 mm Hg, the respiratory rate 21 breaths/minute, and the oxygen saturation 94 % on TC '@28'$ %.  Stat chest x-ray was obtained and showed mild right basilar atelectasis CT angio negative for acute pulmonary embolism.  CT of the abdomen and pelvis with no acute abnormality. ?Pertinent Labs in Red/Diagnostics Findings: ?Na+/ K+: 8/3.9 ?Glucose: 235 ?BUN/Cr.:  33/0.86 ?  ?WBC/ TMAX: 13.5/ afebrile ?PCT: negative <0.10 ?Lactic acid: 2.0 ?COVID PCR: Negative ?Arterial Blood Gas result:  pO2 62; pCO2 47; pH 7.49;  HCO3 35.8, %O2 Sat 95.7.  ? ?Patient was placed on the ventilator with a tidal volume of 450 and rate of 24.  She  received IV fluids and was started on IV antibiotics for possible urosepsis.  PCCM consulted for vent management ? ?Past Medical History  ? 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 ?Significant Hospital Events   ?5/2: Admitted to the ICU with possible sepsis secondary to UTI and chronic vent management ?5/3: Weaned off vent to trach collar.  Eating and drinking.  Will trial home ventilator/trilogy at night.  Urine culture positive for Klebsiella pneumoniae, sensitivities pending.  [facility] evaluating home resources in anticipation of discharge soon ?5/4: Urine culture sensitivities positive for ESBL Klebsiella pneumoniae UTI.  Antibiotics changed to ciprofloxacin.  Patient with soft blood pressure responsive to IV fluids, suspect dehydration due to poor p.o. intake.  Became hypoxic when working with PT. Not ready for discharge at this time ?5/5: Patient admitted with Acute on Chronic Hypoxic Respiratory Failure and sepsis due to Klebsiella pneumoniae (ESBL ) UTI. She was being discharged home on 06/30/21  by EMS when she developed respiratory distress and turned blue prior to leaving the hospital.  EMS immediately began bagging pt via BVM and returned pt to the unit. Patient was placed back on previous vent setting with immediate improvement noted. Discharge cancelled pending further evaluation. Patient's husband notified. ?5/7: Alert and awake,  vent at night ?5/8: Working on discharge planning.  Requesting husband to bring in home ventilator for qhs use and as needed to assess if pt will tolerate  ?5/9: Unable to place pt on home vent overnight due to a piece of equipment missing, therefore unable to discharge home today.  Will contact TOC, pts husband requesting [employer] representative come to his home to make sure he has all the equipment needed for the home ventilator at discharge  ? ?Consults:  ?PCCM ? ?Procedures:  ?None ? ?Significant Diagnostic Tests:  ?5/2: CTA abdomen and pelvis> no abnormality ?5/2: CTA Chest> no evidence of acute pulmonary embolism ? ?Micro Data:  ?5/2: Blood culture x2>negative  ?5/2: Urine Culture> Klebsiella pneumoniae ?5/2: MRSA PCR>>negative  ? ?Antimicrobials:  ?Ceftriaxone 5/2>>5/4 ?Ciprofloxacin 5/4>>5/6 ? ?OBJECTIVE  ?Blood pressure (!) 107/53, pulse 84, temperature 98.2 ?F (36.8 ?C), temperature source Oral, resp. rate 14, height '5\' 4"'$  (1.626 m), weight 73.4 kg, SpO2 96 %. ?   ?Vent Mode: PRVC ?FiO2 (%):  [28 %] 28 % ?Set Rate:  [15 bmp] 15 bmp ?Vt Set:  [450 mL] 450 mL ?PEEP:  [5 cmH20] 5 cmH20  ? ?Intake/Output Summary (Last 24 hours) at 07/04/2021 0958 ?Last data filed at 07/04/2021 0900 ?Gross per 24 hour  ?Intake 2040 ml  ?Output 1300 ml  ?Net 740 ml  ? ?Filed Weights  ? 07/02/21 0341 07/03/21 0500 07/04/21 0500  ?Weight: 75.1 kg 75.1 kg 73.4 kg  ? ?Physical Examination  ?GENERAL: Chronically ill appearing patient female resting in bed, NAD on mechanical ventilator via chronic tracheostomy ?HEENT: Supple, no JVD, chronic size 6 mm shiley tracheostomy midline dressing dry/intact  ?LUNGS: Clear throughout, even, non labored  ?CARDIOVASCULAR: NSR, s1s2, no r/g, 2+ radial/1+ distal pulses, no edema  ?ABDOMEN: +BS x4, soft, nontender, nondistended. No organomegaly or mass. PEG in place.  ?EXTREMITIES: Normal bulk and tone, moves all extremities  ?SKIN: No obvious rash, lesion, or ulcer.  ? ?Labs/imaging that I havepersonally  reviewed  ?(right click and "Reselect all SmartList Selections" daily)  ? ?  ?Labs   ?CBC: ?Recent Labs  ?Lab 06/30/21 ?0409 07/01/21 ?0429 07/02/21 ?3016 07/03/21 ?0330 07/04/21 ?0319  ?WBC 8.3 6.3 6.6 6.6 7.5  ?NEUTROABS  --   --   --   --  5.7  ?HGB 10.5* 10.1* 10.3* 10.2* 11.0*  ?HCT 33.4* 32.9* 33.7* 33.7* 35.4*  ?MCV 86.8 89.4 88.0 88.9 87.0  ?PLT 146* 137* 143* 170 186  ? ? ?Basic Metabolic Panel: ?Recent Labs  ?Lab 06/30/21 ?0409 07/01/21 ?0429 07/02/21 ?0510 07/03/21 ?0330 07/04/21 ?0319  ?NA 141 138 137 137 136  ?K 3.6 5.0 4.9 4.7 4.7  ?CL 108 106 103 100 99  ?CO2 '24 26 24 '$ 32 32  ?GLUCOSE 117* 207* 180* 224* 230*  ?BUN '23 19 19 21 23  '$ ?CREATININE 0.71 0.63 0.60 0.68 0.63  ?CALCIUM 8.5* 8.5* 8.8* 9.2 9.8  ?MG 1.7 2.1 1.8 1.9 1.7  ?PHOS 1.5* 3.6 2.9 3.2 3.6  ? ?GFR: ?Estimated Creatinine Clearance: 56.9 mL/min (by C-G formula based on SCr of 0.63 mg/dL). ?Recent Labs  ?Lab 06/28/21 ?0621 06/29/21 ?0109 06/29/21 ?1225 06/29/21 ?1547 06/30/21 ?0409 07/01/21 ?0429 07/02/21 ?3235 07/03/21 ?0330 07/04/21 ?0319  ?PROCALCITON 0.16  --   --   --   --   --   --   --   --   ?  WBC 8.7   < >  --   --    < > 6.3 6.6 6.6 7.5  ?LATICACIDVEN  --   --  2.0* 1.3  --   --   --   --   --   ? < > = values in this interval not displayed.  ? ? ?Liver Function Tests: ?No results for input(s): AST, ALT, ALKPHOS, BILITOT, PROT, ALBUMIN in the last 168 hours. ? ?No results for input(s): LIPASE, AMYLASE in the last 168 hours. ?No results for input(s): AMMONIA in the last 168 hours. ? ?ABG ?   ?Component Value Date/Time  ? PHART 7.49 (H) 06/26/2021 2205  ? PCO2ART 47 06/26/2021 2205  ? PO2ART 62 (L) 06/26/2021 2205  ? HCO3 35.8 (H) 06/26/2021 2205  ? ACIDBASEDEF 0.9 01/19/2020 1024  ? O2SAT 95.7 06/26/2021 2205  ?  ? ?Coagulation Profile: ?No results for input(s): INR, PROTIME in the last 168 hours. ? ?Cardiac Enzymes: ?No results for input(s): CKTOTAL, CKMB, CKMBINDEX, TROPONINI in the last 168 hours. ? ?HbA1C: ?Hgb A1c MFr Bld   ?Date/Time Value Ref Range Status  ?06/27/2021 04:40 AM 6.1 (H) 4.8 - 5.6 % Final  ?  Comment:  ?  (NOTE) ?Pre diabetes:          5.7%-6.4% ? ?Diabetes:              >6.4% ? ?Glycemic control for   <7.0% ?adult

## 2021-07-04 NOTE — Progress Notes (Signed)
Occupational Therapy Treatment ?Patient Details ?Name: Angela David ?MRN: [id number] ?DOB: 03-14-1940 ?Today's Date: 07/04/2021 ? ? 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 Patient just discharged from [place] to home. ?  ?OT comments ? Upon entering the room, pt supine in bed watching TV. Pt is agreeable to OT intervention.She quickly declined EOB and OOB when asked why she reports, " I don't feel well". OT continues to encourage secondary to pt steadily refusing this activity with all therapy sessions and plans for pt to go home. No family present in room to practice hands on training. Pt agreeable to B UE strengthening exercises with use of towel as dowel rod pulled tight for exercise. Pt performing 2 sets of forwards rows, backwards rows, chest presses, and bicep curls. Pt needing rest breaks after each set of exercise. PMSV not used this session and O2 decreased to 87% with exercise. Pt remains supine with all needs within reach.   ? ?Recommendations for follow up therapy are one component of a multi-disciplinary discharge planning process, led by the attending physician.  Recommendations may be updated based on patient status, additional functional criteria and insurance authorization. ?   ?Follow Up Recommendations ? Home health OT  ?  ?Assistance Recommended at Discharge Frequent or constant Supervision/Assistance  ?Patient can return home with the following ? A lot of help with walking and/or transfers;A lot of help with bathing/dressing/bathroom;Help with stairs or ramp for entrance;Assist for transportation;Assistance with cooking/housework;Direct  supervision/assist for medications management;Direct supervision/assist for financial management ?  ?Equipment Recommendations ? None recommended by OT  ?  ?   ?Precautions / Restrictions Precautions ?Precautions: Fall ?Precaution Comments: trach ?Restrictions ?Weight Bearing Restrictions: No  ? ? ?  ? ?Mobility Bed Mobility ?  ?  ?  ?  ?  ?  ?  ?General bed mobility comments: Pt refusal ?  ? ?Transfers ?  ?  ?  ?  ?  ?  ?  ?  ?  ?General transfer comment: Pt refusal ?  ?  ?   ? ?ADL either performed or assessed with clinical judgement  ? ? ?Extremity/Trunk Assessment Upper Extremity Assessment ?Upper Extremity Assessment: Generalized weakness ?  ?Lower Extremity Assessment ?Lower Extremity Assessment: Generalized weakness ?  ?  ?  ? ?Vision Patient Visual Report: No change from baseline ?  ?  ?   ?   ? ?Cognition Arousal/Alertness: Awake/alert ?Behavior During Therapy: [facility] for tasks assessed/performed ?Overall Cognitive Status: Within Functional Limits for tasks assessed ?  ?  ?  ?  ?  ?  ?  ?  ?  ?  ?  ?  ?  ?  ?  ?  ?  ?  ?  ?   ?   ?   ?   ? ? ?Pertinent Vitals/ Pain       Pain Assessment ?Pain Assessment: No/denies pain ? ?   ?   ? ?Frequency ? Min 2X/week  ? ? ? ? ?  ?Progress Toward Goals ? ?OT Goals(current goals can now be found in the care plan section) ? Progress towards OT goals: Progressing toward goals ? ?Acute Rehab OT Goals ?Patient Stated  Goal: to go home ?OT Goal Formulation: With patient ?Time For Goal Achievement: 07/13/21 ?Potential to Achieve Goals: Good  ?Plan Discharge plan remains appropriate;Frequency remains appropriate   ? ?   ?AM-PAC OT "6 Clicks" Daily Activity     ?Outcome Measure ? ? Help from another person eating meals?: None ?Help from another person taking care of personal grooming?: A Little ?Help from another person toileting, which includes using toliet, bedpan, or urinal?: Total ?Help from another person bathing (including washing, rinsing, drying)?: A Lot ?Help from another  person to put on and taking off regular upper body clothing?: A Little ?Help from another person to put on and taking off regular lower body clothing?: A Lot ?6 Click Score: 15 ? ?  ?End of Session   ? ?OT Visit Diagnosis: Unsteadiness on feet (R26.81);Repeated falls (R29.6);Muscle weakness (generalized) (M62.81) ?  ?Activity Tolerance Patient tolerated treatment well ?  ?Patient Left in bed;with call bell/phone within reach;with bed alarm set ?  ?Nurse Communication Mobility status ?  ? ?   ? ?Time: 0211-1735 ?OT Time Calculation (min): 17 min ? ?Charges: OT General Charges ?$OT Visit: 1 Visit ?OT Treatments ?$Therapeutic Exercise: 8-22 mins ? ?Darleen Crocker, MS, OTR/L , CBIS ?ascom [phone]  ?07/04/21, 3:45 PM  ?

## 2021-07-04 NOTE — Consult Note (Signed)
? ?Urology Consult ? ?I have been asked to see the patient by Dr. Meda Coffee, for evaluation and management of recurrent UTI. ? ?Chief Complaint: Recurrent UTI ? ?History of Present Illness: Angela David is a 81 y.o. 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 Family has requested urology consult due to her recurrent UTIs and inability to transport her for outpatient follow-up. ? ?She has received antibiotics during this admission as below. Today, WBC count is normal at 7.5. Creatinine is stable at 0.63. A1c 6.1 on admission. ? ?Admission CT AP with contrast dated 06/26/2021 with no significant urologic findings. ? ?Urine culture history as below. ?06/27/2021 ESBL Klebsiella pneumoniae ?02/26/2021 No growth ?01/26/2021 Insignificant growth ?10/29/2020 Multiple species present ?05/02/2020 Mixed urogenital flora (>16 epithelial cells/hpf) ?01/23/2020 No growth ?01/01/2020 MDR E coli + Proteus mirabilis ?11/10/2019 Insignificant growth ?10/30/2019 MDR E coli (6-15 epithelial cells/hpf) ?10/02/2019 MDR E coli (>16 epithelial cells/hpf) ?04/28/2019 Klebsiella pneumoniae ?02/05/2019 E coli ?11/20/2018 Ampicillin-resistant E Coli (6-15 epithelial cells/hpf) ?09/05/2018 Pansensitive E coli ? ?Today she reports several years of recurrent UTIs.  She states she has had up to 5 UTIs annually.  Her symptoms typically include dysuria, urgency, frequency, and painful "shocks" with termination.  These symptoms resolve completely with antibiotics.  She has never seen a urologist before.  She denies a history of urolithiasis or gross hematuria.  She does report a history of chronic constipation for which she takes stool softeners.  She states she thinks her primary care may have prescribed her estrogen cream several years ago, however she never used it. ? ?Anti-infectives (From admission, onward)  ? ? Start      Dose/Rate Route Frequency Ordered Stop  ? 06/30/21 0000  ciprofloxacin (CIPRO) 250 MG tablet       ? 250 mg Per Tube 2 times daily 06/30/21 1426    ? 06/29/21 1115  ciprofloxacin (CIPRO) tablet 250 mg  Status:  Discontinued       ? 250 mg Oral 2 times daily 06/29/21 1027 06/29/21 1042  ? 06/29/21 1115  ciprofloxacin (CIPRO) tablet 250 mg       ? 250 mg Per Tube 2 times daily 06/29/21 1042 07/01/21 2016  ? 06/27/21 2200  cefTRIAXone (ROCEPHIN) 1 g in sodium chloride 0.9 % 100 mL IVPB  Status:  Discontinued       ? 1 g ?200 mL/hr over 30 Minutes Intravenous Every 24 hours 06/27/21 0436 06/29/21 1027  ? 06/27/21 0145  cefTRIAXone (ROCEPHIN) 1 g in sodium chloride 0.9 % 100 mL IVPB       ? 1 g ?200 mL/hr over 30 Minutes Intravenous  Once 06/27/21 0136 06/27/21 0305  ? ?  ?  ? ?Past Medical History:  ?Diagnosis Date  ? Breast cancer, right ([place]) 2002  ? Tx'd with chemotherapy and XRT  ? Chronic respiratory failure ([place])   ? s/p tracheostomy in 2012; on ventilatory support at hs  ? Diabetic peripheral neuropathy ([place]) 08/28/2016  ? GERD (gastroesophageal reflux disease)   ? Glaucoma   ? High cholesterol   ? History of colon polyps   ? Osteoarthritis   ? Personal history of chemotherapy 2002  ? BREAST CA  ? Personal history of radiation therapy 2002  ? BREAST CA  ? S/P percutaneous endoscopic gastrostomy (PEG) tube placement ([place])   ? T2DM (type 2 diabetes  mellitus) ([place])   ? 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 Ventral hernia   ? Vocal cord paralysis 2012  ? ? ?Past Surgical History:  ?Procedure Laterality Date  ? ABDOMINAL HYSTERECTOMY    ? BREAST BIOPSY Right 2008  ? neg  ? BREAST BIOPSY Right 06/30/2020  ? "Q" clip-path pending  ? BREAST EXCISIONAL BIOPSY Right 2002  ? positive  ? BREAST LUMPECTOMY Right 2002  ? chemo and radiation  ? BREAST LUMPECTOMY WITH RADIOFREQUENCY TAG IDENTIFICATION Right 10/18/2020  ? Procedure: BREAST LUMPECTOMY WITH RADIOFREQUENCY TAG IDENTIFICATION;  Surgeon: Jules Husbands, MD;  Location:  [facility] ORS;  Service: General;  Laterality: Right;  ? BREAST SURGERY Right 2002  ? LUMPECTOMY, radiation, chemotherapy  ? CATARACT EXTRACTION, BILATERAL    ? CHOLECYSTECTOMY N/A 12/10/2017  ? Procedure: LAPAROSCOPIC CHOLECYSTECTOMY;  Surgeon: Benjamine Sprague, DO;  Location: [facility] ORS;  Service: General;  Laterality: N/A;  ? COLON RESECTION Right 11/06/2018  ? Procedure: HAND ASSISTED LAPAROSCOPIC COLON RESECTION, RIGHT;  Surgeon: Jules Husbands, MD;  Location: [facility] ORS;  Service: General;  Laterality: Right;  ? COLONOSCOPY  2009,12/30/2013  ? COLONOSCOPY WITH PROPOFOL N/A 04/16/2018  ? Tubulovillous polyp proximal transverse colon, large sessile polyp, proximal transverse colon;  Surgeon: Robert Bellow, MD;  Location: [facility] ENDOSCOPY;  Service: Endoscopy;  Laterality: N/A;  ? ESOPHAGOGASTRODUODENOSCOPY (EGD) WITH PROPOFOL N/A 10/14/2018  ? Procedure: ESOPHAGOGASTRODUODENOSCOPY (EGD) WITH PROPOFOL;  Surgeon: Lucilla Lame, MD;  Location: [facility] ENDOSCOPY;  Service: Endoscopy;  Laterality: N/A;  ? TRACHEAL SURGERY  2013  ? TRACHEOSTOMY N/A   ? Dr Kathyrn Sheriff  ? XI ROBOTIC ASSISTED VENTRAL HERNIA N/A 10/18/2020  ? Procedure: XI ROBOTIC ASSISTED VENTRAL HERNIA;  Surgeon: Jules Husbands, MD;  Location: [facility] ORS;  Service: General;  Laterality: N/A;  ? ? ?Home Medications:  ?Current Meds  ?Medication Sig  ? atorvastatin (LIPITOR) 10 MG tablet Place 10 mg into feeding tube at bedtime.  ? enoxaparin (LOVENOX) 40 MG/0.4ML injection Inject 0.4 mLs (40 mg total) into the skin at bedtime.  ? escitalopram (LEXAPRO) 10 MG tablet Place 3 tablets (30 mg total) into feeding tube daily.  ? gabapentin (NEURONTIN) 300 MG capsule Place 300 mg into feeding tube every 8 (eight) hours.  ? hydrOXYzine (ATARAX) 25 MG tablet Place 25 mg into feeding tube 2 (two) times daily.  ? insulin glargine-yfgn (SEMGLEE) 100 UNIT/ML injection Inject 40 Units into the skin every 12 (twelve) hours.  ? insulin regular (NOVOLIN R) 100 units/mL injection Inject  subcutaneously every 6 hours per sliding scale. If BS is less than 150; 0 units, 151-200; 2 units, 201-250; 4 units, 251-300; 6 units, 301-350; 8 units, 351-400; 10 units.  If blood sugar is less than 60 or more than 400 call MD.  ? lansoprazole (PREVACID SOLUTAB) 30 MG disintegrating tablet Place 30 mg into feeding tube daily at 12 noon.  ? metoprolol tartrate (LOPRESSOR) 25 MG tablet Place 1 tablet (25 mg total) into feeding tube daily. (Patient taking differently: Place 12.5 mg into feeding tube 2 (two) times daily.)  ? Multiple Vitamin (MULTIVITAMIN) LIQD Place 15 mLs into feeding tube daily.  ? Propylene Glycol (SYSTANE BALANCE) 0.6 % SOLN Place 1 drop into both eyes 2 (two) times daily.  ? psyllium (HYDROCIL/METAMUCIL) 95 % PACK Take 1 packet by mouth daily.  ? [DISCONTINUED] albuterol (PROVENTIL) (2.5 MG/3ML) 0.083% nebulizer solution Take 3 mLs (2.5 mg total) by nebulization every 6 (six) hours as needed for wheezing or shortness of breath.  ? [  DISCONTINUED] ipratropium (ATROVENT) 0.02 % nebulizer solution Take 2.5 mLs (0.5 mg total) by nebulization every 6 (six) hours as needed for wheezing or shortness of breath.  ? ? ?Allergies:  ?Allergies  ?Allergen Reactions  ? Shellfish Allergy Shortness Of Breath  ?  respitatory distress   ? Sodium Sulfite Shortness Of Breath  ?  Severe Congestion that creates inability to breath  ? Sulfa Antibiotics Shortness Of Breath and Other (See Comments)  ?  respitatory distress  ? Codeine Other (See Comments)  ?  Per patient "it kept her up all night"  ? Glucerna [Alimentum] Itching  ? Wound Dressing Adhesive Rash  ? ? ?Family History  ?Problem Relation Age of Onset  ? Cancer Father   ?     bone cancer  ? Cancer Sister   ?     lymphoma  ? Cancer Brother   ? Breast cancer Neg Hx   ? ? ?Social History:  reports that she has never smoked. She has never used smokeless tobacco. She reports that she does not drink alcohol and does not use drugs. ? ?ROS: ?A complete review of  systems was performed.  All systems are negative except for pertinent findings as noted. ? ?Physical Exam:  ?Vital signs in last 24 hours: ?Temp:  [98.2 ?F (36.8 ?C)-99.1 ?F (37.3 ?C)] 98.2 ?F (36.8 ?C) (05/09 0725) ?Pul

## 2021-07-04 NOTE — Discharge Instructions (Signed)
For UTI prevention, please do the following: ?-Continue to manage your diabetes well ?-Continue your bowel regimen to manage your chronic constipation ?-Start the following daily supplements, which may be purchased over the counter at your local pharmacy: ? -Cranberry ? -d-mannose ? -Probiotic containing the bacteria called Lactobacillus ?-Start topical vaginal estrogen cream. Apply one pea-sized amount around the opening of the urethra (where the urine comes out) every day for 2 weeks, then 3 times per week indefinitely ?

## 2021-07-05 ENCOUNTER — Inpatient Hospital Stay: Payer: Medicare Other

## 2021-07-05 DIAGNOSIS — J9622 Acute and chronic respiratory failure with hypercapnia: Secondary | ICD-10-CM | POA: Diagnosis not present

## 2021-07-05 DIAGNOSIS — J9621 Acute and chronic respiratory failure with hypoxia: Secondary | ICD-10-CM | POA: Diagnosis not present

## 2021-07-05 LAB — CBC WITH DIFFERENTIAL/PLATELET
Abs Immature Granulocytes: 0.06 10*3/uL (ref 0.00–0.07)
Basophils Absolute: 0 10*3/uL (ref 0.0–0.1)
Basophils Relative: 1 %
Eosinophils Absolute: 0.2 10*3/uL (ref 0.0–0.5)
Eosinophils Relative: 3 %
HCT: 36.8 % (ref 36.0–46.0)
Hemoglobin: 11.3 g/dL — ABNORMAL LOW (ref 12.0–15.0)
Immature Granulocytes: 1 %
Lymphocytes Relative: 22 %
Lymphs Abs: 1.3 10*3/uL (ref 0.7–4.0)
MCH: 26.8 pg (ref 26.0–34.0)
MCHC: 30.7 g/dL (ref 30.0–36.0)
MCV: 87.2 fL (ref 80.0–100.0)
Monocytes Absolute: 0.5 10*3/uL (ref 0.1–1.0)
Monocytes Relative: 8 %
Neutro Abs: 3.9 10*3/uL (ref 1.7–7.7)
Neutrophils Relative %: 65 %
Platelets: 185 10*3/uL (ref 150–400)
RBC: 4.22 MIL/uL (ref 3.87–5.11)
RDW: 13.6 % (ref 11.5–15.5)
WBC: 5.9 10*3/uL (ref 4.0–10.5)
nRBC: 0 % (ref 0.0–0.2)

## 2021-07-05 LAB — BASIC METABOLIC PANEL
Anion gap: 5 (ref 5–15)
BUN: 23 mg/dL (ref 8–23)
CO2: 34 mmol/L — ABNORMAL HIGH (ref 22–32)
Calcium: 9.7 mg/dL (ref 8.9–10.3)
Chloride: 100 mmol/L (ref 98–111)
Creatinine, Ser: 0.69 mg/dL (ref 0.44–1.00)
GFR, Estimated: >60 mL/min (ref >60)
Glucose, Bld: 130 mg/dL — ABNORMAL HIGH (ref 70–99)
Potassium: 4.4 mmol/L (ref 3.5–5.1)
Sodium: 139 mmol/L (ref 135–145)

## 2021-07-05 LAB — GLUCOSE, CAPILLARY
Glucose-Capillary: 129 mg/dL — ABNORMAL HIGH (ref 70–99)
Glucose-Capillary: 159 mg/dL — ABNORMAL HIGH (ref 70–99)
Glucose-Capillary: 167 mg/dL — ABNORMAL HIGH (ref 70–99)
Glucose-Capillary: 202 mg/dL — ABNORMAL HIGH (ref 70–99)
Glucose-Capillary: 208 mg/dL — ABNORMAL HIGH (ref 70–99)
Glucose-Capillary: 248 mg/dL — ABNORMAL HIGH (ref 70–99)
Glucose-Capillary: 249 mg/dL — ABNORMAL HIGH (ref 70–99)

## 2021-07-05 LAB — PHOSPHORUS: Phosphorus: 4.3 mg/dL (ref 2.5–4.6)

## 2021-07-05 LAB — MAGNESIUM: Magnesium: 1.9 mg/dL (ref 1.7–2.4)

## 2021-07-05 MED ORDER — FAMOTIDINE 20 MG PO TABS
20.0000 mg | ORAL_TABLET | Freq: Once | ORAL | Status: AC
  Administered 2021-07-05: 20 mg
  Filled 2021-07-05: qty 1

## 2021-07-05 MED ORDER — DIPHENHYDRAMINE HCL 50 MG/ML IJ SOLN
25.0000 mg | Freq: Once | INTRAMUSCULAR | Status: AC
  Administered 2021-07-05: 25 mg via INTRAVENOUS
  Filled 2021-07-05: qty 1

## 2021-07-05 MED ORDER — SODIUM CHLORIDE 0.9 % IV BOLUS
500.0000 mL | Freq: Once | INTRAVENOUS | Status: AC
  Administered 2021-07-05: 500 mL via INTRAVENOUS

## 2021-07-05 MED ORDER — SODIUM CHLORIDE 3 % IN NEBU: 4.0000 mL | INHALATION_SOLUTION | Freq: Two times a day (BID) | RESPIRATORY_TRACT | 12 refills | Status: AC

## 2021-07-05 NOTE — Progress Notes (Signed)
? ? ?NAME:  Angela David, MRN:  [id number], DOB:  03/23/1940, LOS: 8 ?ADMISSION DATE:  06/26/2021, CONSULTATION DATE:  06/27/2021 ?REFERRING MD:  Pryor Curia MD  CHIEF COMPLAINT: Unresponsiveness  ? ? 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 ? ?Patient was recently discharged from the hospital on 03/06/2021 to [facility]  following prolonged admission for Acute on Chronic Hypoxic & Respiratory Failure Secondary to suspected aspiration pneumonia, ? healthcare associated pneumonia (? COLONIZATION) and influenza A viral infection and sepsis due to aspiration pneumonia and suspected abdominal abscess. Per ED notes, patient was discharged from [facility] yesterday evening and was placed on new home vent for approximately 3 hours when she developed respiratory distress. Per patient's husband, altered, diaphoretic and unresponsive therefore EMS was called. ? ?ED Course: In the emergency department, the temperature was 37.1?C, the heart rate 120 beats/minute, the blood pressure  109/47 mm Hg, the respiratory rate 21 breaths/minute, and the oxygen saturation 94 % on TC '@28'$ %.  Stat chest x-ray was obtained and showed mild right basilar atelectasis CT angio negative for acute pulmonary embolism.  CT of the abdomen and pelvis with no acute abnormality. ?Pertinent Labs in Red/Diagnostics Findings: ?Na+/ K+: 8/3.9 ?Glucose: 235 ?BUN/Cr.:  33/0.86 ?  ?WBC/ TMAX: 13.5/ afebrile ?PCT: negative <0.10 ?Lactic acid: 2.0 ?COVID PCR: Negative ?Arterial Blood Gas result:  pO2 62; pCO2 47; pH 7.49;  HCO3 35.8, %O2 Sat 95.7.  ? ?Patient was placed on the ventilator with a tidal volume of 450 and rate of 24.  She  received IV fluids and was started on IV antibiotics for possible urosepsis.  PCCM consulted for vent management ? ?Past Medical History  ? 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 ?Significant Hospital Events   ?5/2: Admitted to the ICU with possible sepsis secondary to UTI and chronic vent management ?5/3: Weaned off vent to trach collar.  Eating and drinking.  Will trial home ventilator/trilogy at night.  Urine culture positive for Klebsiella pneumoniae, sensitivities pending.  [employer] evaluating home resources in anticipation of discharge soon ?5/4: Urine culture sensitivities positive for ESBL Klebsiella pneumoniae UTI.  Antibiotics changed to ciprofloxacin.  Patient with soft blood pressure responsive to IV fluids, suspect dehydration due to poor p.o. intake.  Became hypoxic when working with PT. Not ready for discharge at this time ?5/5: Patient admitted with Acute on Chronic Hypoxic Respiratory Failure and sepsis due to Klebsiella pneumoniae (ESBL ) UTI. She was being discharged home on 06/30/21  by EMS when she developed respiratory distress and turned blue prior to leaving the hospital.  EMS immediately began bagging pt via BVM and returned pt to the unit. Patient was placed back on previous vent setting with immediate improvement noted. Discharge cancelled pending further evaluation. Patient's husband notified. ?5/7: Alert and awake,  vent at night ?5/8: Working on discharge planning.  Requesting husband to bring in home ventilator for qhs use and as needed to assess if pt will tolerate  ?5/9: Unable to place pt on home vent overnight due to a piece of equipment missing, therefore unable to discharge home today.  Will contact TOC, pts husband requesting [employer] representative come to his home to make sure he has all the equipment needed for the home ventilator at discharge  ?5/10: Pt BP soft today, reports poor PO intake.  Will give 1L NS bolus ~ BP fluid responsive. Hopeful for discharge today, awaiting confirmation pt has all available all needed home resources ? ?Consults:  ?PCCM ? ?Procedures:  ?None ? ?Significant Diagnostic Tests:  ?5/2: CTA abdomen and pelvis> no abnormality ?5/2: CTA Chest> no evidence of acute pulmonary embolism ? ?Micro Data:  ?5/2: Blood culture x2>negative  ?5/2: Urine Culture> Klebsiella pneumoniae ?5/2: MRSA PCR>>negative  ? ?Antimicrobials:  ?Ceftriaxone 5/2>>5/4 ?Ciprofloxacin 5/4>>5/6 ? ?OBJECTIVE  ?Blood pressure (!) 91/44, pulse 84, temperature 98 ?F (36.7 ?C), temperature source Oral, resp. rate 19, height '5\' 4"'$  (1.626 m), weight 73.4 kg, SpO2 97 %. ?   ?Vent Mode: PRVC ?FiO2 (%):  [28 %] 28 % ?Set Rate:  [15 bmp] 15 bmp ?Vt Set:  [450 mL] 450 mL ?PEEP:  [5 cmH20] 5 cmH20  ? ?Intake/Output Summary (Last 24 hours) at 07/05/2021 0947 ?Last data filed at 07/05/2021 0600 ?Gross per 24 hour  ?Intake 850.15 ml  ?Output 1200 ml  ?Net -349.85 ml  ? ? ?Filed Weights  ? 07/02/21 0341 07/03/21 0500 07/04/21 0500  ?Weight: 75.1 kg 75.1 kg 73.4 kg  ? ?Physical Examination  ?GENERAL: Chronically ill appearing patient female resting in bed, NAD on mechanical ventilator via chronic tracheostomy ?HEENT: Supple, no JVD, chronic size 6 mm shiley tracheostomy midline dressing dry/intact  ?LUNGS: Clear throughout, even, non labored  ?CARDIOVASCULAR: NSR, s1s2, no r/g, 2+ radial/1+ distal pulses, no edema  ?ABDOMEN: +BS x4, soft,  nontender, nondistended. No organomegaly or mass. PEG in place.  ?EXTREMITIES: Normal bulk and tone, moves all extremities  ?SKIN: No obvious rash, lesion, or ulcer.  ? ?Labs/imaging that I havepersonally reviewed  ?(right click and "Reselect all SmartList Selections" daily)  ? ?  ?Labs   ?CBC: ?Recent Labs  ?Lab 07/01/21 ?0429 07/02/21 ?0962 07/03/21 ?0330 07/04/21 ?0319 07/05/21 ?8366  ?WBC 6.3 6.6 6.6 7.5 5.9  ?NEUTROABS  --   --   --  5.7 3.9  ?HGB 10.1* 10.3* 10.2* 11.0* 11.3*  ?HCT 32.9* 33.7* 33.7* 35.4* 36.8  ?MCV 89.4 88.0 88.9 87.0 87.2  ?PLT 137* 143* 170 186 185  ? ? ? ?Basic Metabolic Panel: ?Recent Labs  ?Lab 07/01/21 ?0429 07/02/21 ?0510 07/03/21 ?0330 07/04/21 ?0319 07/05/21 ?0408  ?NA 138 137 137 136 139  ?K 5.0 4.9 4.7 4.7 4.4  ?CL 106 103 100 99 100  ?CO2 26 24 32 32 34*  ?GLUCOSE 207* 180* 224* 230* 130*  ?BUN '19 19 21 23 23  '$ ?CREATININE 0.63 0.60 0.68 0.63 0.69  ?CALCIUM 8.5* 8.8* 9.2 9.8 9.7  ?MG 2.1 1.8 1.9 1.7 1.9  ?PHOS 3.6 2.9 3.2 3.6 4.3  ? ? ?GFR: ?Estimated Creatinine Clearance: 56.9 mL/min (by C-G formula based on SCr of  0.69 mg/dL). ?Recent Labs  ?Lab 06/29/21 ?1225 06/29/21 ?1547 06/30/21 ?0409 07/02/21 ?0932 07/03/21 ?0330 07/04/21 ?0319 07/05/21 ?6712  ?WBC  --   --    < > 6.6 6.6 7.5 5.9  ?LATICACIDVEN 2.0* 1.3  --   --   --   --   --   ? < > = values in this interval not displayed.  ? ? ? ?Liver Function Tests: ?No results for input(s): AST, ALT, ALKPHOS, BILITOT, PROT, ALBUMIN in the last 168 hours. ? ?No results for input(s): LIPASE, AMYLASE in the last 168 hours. ?No results for input(s): AMMONIA in the last 168 hours. ? ?ABG ?   ?Component Value Date/Time  ? PHART 7.49 (H) 06/26/2021 2205  ? PCO2ART 47 06/26/2021 2205  ? PO2ART 62 (L) 06/26/2021 2205  ? HCO3 35.8 (H) 06/26/2021 2205  ? ACIDBASEDEF 0.9 01/19/2020 1024  ? O2SAT 95.7 06/26/2021 2205  ? ?  ? ?Coagulation Profile: ?No results for input(s): INR, PROTIME in the last 168 hours. ? ?Cardiac Enzymes: ?No results for  input(s): CKTOTAL, CKMB, CKMBINDEX, TROPONINI in the last 168 hours. ? ?HbA1C: ?Hgb A1c MFr Bld  ?Date/Time Value Ref Range Status  ?06/27/2021 04:40 AM 6.1 (H) 4.8 - 5.6 % Final  ?  Comment:  ?  (NOTE) ?Pre diab

## 2021-07-05 NOTE — TOC Transition Note (Signed)
Transition of Care (TOC) - CM/SW Discharge Note ? ? ?Patient Details  ?Name: Angela David ?MRN: [id number] ?Date of Birth: [birth year] ? ?Transition of Care (TOC) CM/SW Contact:  ?Shelbie Hutching, RN ?Phone Number: ?07/05/2021, 1:45 PM ? ? ?Clinical Narrative:    ?Patient medically cleared for discharge home with husband and home health services to come out.  Waunita Schooner RT with [employer] met with patient and husband at the bedside this morning to go over teaching with the oxygen and the vent.  Waunita Schooner will also meet patient and husband at the home when she arrives.  Nebulizer and trach humidification ordered and will be delivered to the home, husband will take the vent home.  EMS will be transporting patient.  RNCM has called and set up EMS told them she would be ready after 1430.   ?Corene Cornea with [employer] notified of discharge today.  Will get MD to add RN to the orders.  ? ? ?Final next level of care: [place] ?Barriers to Discharge: Barriers Resolved ? ? ?Patient Goals and CMS Choice ?Patient states their goals for this hospitalization and ongoing recovery are:: to go home ?CMS Medicare.gov Compare Post Acute Care list provided to:: Patient ?Choice offered to / list presented to : Patient ? ?Discharge Placement ?  ?           ?  ?Patient to be transferred to facility by: [place] to Transport home ?Name of family member notified: [name] ?Patient and family notified of of transfer: 07/05/21 ? ?Discharge Plan and Services ?  ?Discharge Planning Services: CM Consult ?Post Acute Care Choice: Home Health, Resumption of Svcs/PTA Provider          ?DME Arranged: Nebulizer machine, Nebulizer/meds, Other see comment (trach humidification) ?DME Agency: [facility] ?Date DME Agency Contacted: 07/05/21 ?Time DME Agency Contacted: 1000 ?Representative spoke with at DME Agency: Thedore Mins ?HH Arranged: RN, PT, OT, Social Work ?[place] Agency: [place] ([place]) ?Date HH Agency Contacted: 07/05/21 ?Time [place]: [phone] ?Representative spoke with at [place]: Floydene Flock ? ?Social Determinants of Health (SDOH) Interventions ?  ? ? ?Readmission Risk Interventions ? ?  06/30/2021  ?  2:19 PM 02/23/2021  ? 12:23 PM  ?Readmission Risk Prevention Plan  ?Transportation Screening Complete Complete  ?PCP or Specialist Appt within 3-5 Days  Complete  ?[place] or Home Care Consult  Complete  ?Social Work Consult for [place] Planning/Counseling  Complete  ?Palliative Care Screening  Not Applicable  ?Medication Review Press photographer) Complete Complete  ?PCP or Specialist appointment within 3-5 days of discharge Complete   ?[place] or Home Care Consult Complete   ?SW Recovery Care/Counseling Consult Complete   ?Palliative Care Screening Complete   ?[place] Not Applicable   ? ? ? ? ? ?

## 2021-07-05 NOTE — TOC Progression Note (Signed)
Transition of Care (TOC) - Progression Note  ? ? ?Patient Details  ?Name: Angela David ?MRN: [id number] ?Date of Birth: Jul 13, 1940 ? ?Transition of Care (TOC) CM/SW Contact  ?Shelbie Hutching, RN ?Phone Number: ?07/05/2021, 4:29 PM ? ?Clinical Narrative:    ?Patient and family still not comfortable with respiratory equipment, [employer] said that they were going to take care of it but it has not happened yet.  There are also now questions about the patient's diabetes medications.  No discharge today, EMS canceled.  ? ? ?Expected Discharge Plan: [place] ?Barriers to Discharge: Barriers Resolved ? ?Expected Discharge Plan and Services ?Expected Discharge Plan: [place] ?  ?Discharge Planning Services: CM Consult ?Post Acute Care Choice: Home Health, Resumption of Svcs/PTA Provider ?Living arrangements for the past 2 months: [place] ?Expected Discharge Date: 07/05/21               ?DME Arranged: Nebulizer machine, Nebulizer/meds, Other see comment (trach humidification) ?DME Agency: [facility] ?Date DME Agency Contacted: 07/05/21 ?Time DME Agency Contacted: 1000 ?Representative spoke with at DME Agency: Thedore Mins ?HH Arranged: RN, PT, OT, Social Work ?[place] Agency: [place] ([place]) ?Date HH Agency Contacted: 07/05/21 ?Time [place]: 6333 ?Representative spoke with at [place]: Floydene Flock ? ? ?Social Determinants of Health (SDOH) Interventions ?  ? ?Readmission Risk Interventions ? ?  06/30/2021  ?  2:19 PM 02/23/2021  ? 12:23 PM  ?Readmission Risk Prevention Plan  ?Transportation Screening Complete Complete  ?PCP or Specialist Appt within 3-5 Days  Complete  ?[place] or Home Care Consult  Complete  ?Social Work Consult for [place] Planning/Counseling  Complete  ?Palliative Care Screening  Not Applicable  ?Medication Review Press photographer) Complete Complete  ?PCP or Specialist appointment within 3-5 days of discharge Complete   ?[place] or Home Care  Consult Complete   ?SW Recovery Care/Counseling Consult Complete   ?Palliative Care Screening Complete   ?[place] Not Applicable   ? ? ?

## 2021-07-05 NOTE — Progress Notes (Signed)
Inpatient Diabetes Program Recommendations ? ?AACE/ADA: New Consensus Statement on Inpatient Glycemic Control (2015) ? ?Target Ranges:  Prepandial:   less than 140 mg/dL ?     Peak postprandial:   less than 180 mg/dL (1-2 hours) ?     Critically ill patients:  140 - 180 mg/dL  ? ?Lab Results  ?Component Value Date  ? GLUCAP 248 (H) 07/05/2021  ? HGBA1C 6.1 (H) 06/27/2021  ? ? ?Review of Glycemic Control ? Latest Reference Range & Units 07/05/21 00:28 07/05/21 05:01 07/05/21 07:44 07/05/21 11:47  ?Glucose-Capillary 70 - 99 mg/dL 208 (H) 129 (H) 159 (H) 248 (H)  ? ?Diabetes history: DM 2 ?Outpatient Diabetes medications:  ?Regular 0-10 units q 6 hours ?Semglee 40 units bid ?Tube feed- 1440 ml/daily ?Current orders for Inpatient glycemic control:  ?Novolog 0-9 units q 4 hours ?Inpatient Diabetes Program Recommendations:   ?May consider adding Semglee 10 units daily.  ? ?Thanks,  ?Adah Perl, RN, BC-ADM ?Inpatient Diabetes Coordinator ?Pager [phone]  (8a-5p) ? ? ?

## 2021-07-05 NOTE — Discharge Summary (Addendum)
Physician Discharge Summary  ?Patient ID: ?Angela David ?MRN: [id number] ?DOB/AGE: 79/13/1942 79 y.o. ? ?Admit date: 06/26/2021 ?Discharge date: 07/06/2021 ? ? ?Brief Pt Description / Synopsis:  ?81 y.o. 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 ? ?Discharge Diagnoses:  ? 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                                                ?          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 ?  ?Patient was recently discharged from the hospital on 03/06/2021 to [facility]  following prolonged admission for Acute on Chronic Hypoxic & Respiratory Failure Secondary to suspected aspiration pneumonia, ? healthcare associated pneumonia (? COLONIZATION) and influenza A viral infection and sepsis due to aspiration pneumonia and suspected abdominal abscess. Per ED notes, patient was discharged from [facility] yesterday evening and was placed on new home vent for approximately 3 hours when she developed respiratory distress. Per patient's husband, altered, diaphoretic and unresponsive therefore EMS was called. ?  ?ED Course: In the emergency department, the temperature was 37.1?C, the heart rate 120 beats/minute, the blood pressure  109/47 mm Hg, the respiratory rate 21 breaths/minute, and the oxygen saturation 94 % on TC '@28' %.  Stat chest  x-ray was obtained and showed mild right basilar atelectasis CT angio negative for acute pulmonary embolism.  CT of the abdomen and pelvis with no acute abnormality. ?Pertinent Labs in Red/Diagnostics Findings: ?Na+/ K+: 8/3.9 ?Glucose: 235 ?BUN/Cr.:  33/0.86 ?  ?WBC/ TMAX: 13.5/ afebrile ?PCT: negative <0.10 ?Lactic acid: 2.0 ?COVID PCR: Negative ?Arterial Blood Gas result:  pO2 62; pCO2 47; pH 7.49;  HCO3 35.8, %O2 Sat 95.7.  ?  ?Patient was placed on the ventilator with a tidal volume of 450 and rate of 24.  She received IV fluids and was started on IV antibiotics for possible urosepsis.  PCCM consulted for vent management ? ? ?Discharge Plan by Diagnosis:  ? 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  ?Sepsis due to ESBL klebsiella pneumoniae UTI ~ TREATED ?Frequent urination  ?Met sepsis criteria on admission ?- Monitor fever curve ?- Trend WBC's & Procalcitonin ?- Follow cultures as above ?- Received cipro x4 doses ?- Urology evaluated ~ Recommendations "-Obtain catheterized urine samples for UA and culture moving forward when there is clinical suspicion for UTI ?-Continue  tight glycemic control ?-Continue bowel regimen for chronic constipation ?-Start daily cranberry, d-mannose, and lactobacillus-containing probiotics ?-Start topical vaginal estrogen cream" ?   ?Diabetes mellitus      ?- CBG's  ?- Start home lantus daily ?-Follow up with PCP for further adjustments ?   ?Nutritional Status/Diet ?- Continue TF per PEG as tolerated pending Dietician recs ?- Dietician  consult ?- Constipation protocol as indicated ?   ?Hypomagnesia ~ RESOLVED ?- Trend BMP  ?- Pharmacy consulted: replace electrolytes as indicated ? ? ?Significant Events:  ?5/2: Admitted to the ICU with possible sepsis secondary to UTI and chronic vent management ?5/3: Weaned off vent to trach collar.  Eating and drinking.  Will trial home ventilator/trilogy at night.  Urine culture positive for Klebsiella pneumoniae, sensitivities pending.  [employer] evaluating home resources in anticipation of discharge soon ?5/4: Urine culture sensitivities positive for ESBL Klebsiella pneumoniae UTI.  Antibiotics changed to ciprofloxacin.  Patient with soft blood pressure responsive to IV fluids, suspect dehydration due to poor p.o. intake.  Became hypoxic when working with PT. Not ready for discharge at this time ?5/5: Patient admitted with Acute on Chronic Hypoxic Respiratory Failure and sepsis due to Klebsiella pneumoniae (ESBL ) UTI. She was being discharged home on 06/30/21  by EMS when she developed respiratory distress and turned blue prior to leaving the hospital. EMS immediately began bagging pt via BVM and returned pt to the unit. Patient was placed back on previous vent setting with immediate improvement noted. Discharge cancelled pending further evaluation. Patient's husband notified. ?5/7: Alert and awake, vent at night ?5/8: Working on discharge planning.  Requesting husband to bring in home ventilator for qhs use and as needed to assess if pt will tolerate  ?5/9: Unable to place pt on home vent overnight due to a piece of equipment missing, therefore unable to discharge home today.  Will contact TOC, pts husband requesting [employer] representative come to his home to make sure he has all the equipment needed for the home ventilator at discharge  ?5/10: Pt BP soft today, reports poor PO intake.  Will give 1L NS bolus ~ BP fluid responsive. Hopeful for discharge today, awaiting confirmation pt has all available all needed  home resources               ? ? ?Significant Diagnostic Studies:  ?5/2: CTA abdomen and pelvis> no abnormality ?5/2: CTA Chest> no evidence of acute pulmonary embolism ?          ?Micro Data:  ?5/2: Blood culture x2>negative  ?5/2: Urine Culture> Klebsiella pneumoniae (ESBL) ~sensitive to ciprofloxacin, gentamicin, imipenem, Zosyn ?5/2: MRSA PCR>>negative  ? ?Antimicrobials:  ?Ceftriaxone 5/2>>5/4 ?Ciprofloxacin 5/4>>5/6 ? ?Consults:  ?PCCM ? ?Discharge Exam:  ?GENERAL: Chronically ill appearing patient female resting in bed, NAD on mechanical ventilator via chronic tracheostomy ?HEENT: Supple, no JVD, chronic size 6 mm shiley tracheostomy midline dressing dry/intact  ?LUNGS: Clear throughout, even, non labored  ?CARDIOVASCULAR: NSR, s1s2, no r/g, 2+ radial/1+ distal pulses, no edema  ?ABDOMEN: +BS x4, soft, nontender, nondistended. No organomegaly or mass. PEG in place.  ?EXTREMITIES: Normal bulk and tone, moves all extremities  ?SKIN: No obvious rash, lesion, or ulcer.  ?  ?Vitals:  ? 07/06/21 0850 07/06/21 0900 07/06/21 1000 07/06/21 1100  ?BP:  (!) 101/45 (!) 110/44 (!) 144/52  ?Pulse:  84 89 90  ?Resp:  '18 20 19  ' ?Temp: 98 ?F (36.7 ?C)     ?TempSrc: Axillary     ?SpO2:  92% 92% 94%  ?Weight:      ?Height:      ? ? ? ?Discharge Labs:  ? ?BMET ?Recent Labs  ?Lab 07/02/21 ?0510 07/03/21 ?0330 07/04/21 ?0319 07/05/21 ?0408 07/06/21 ?0430  ?NA 137 137 136 139 136  ?K 4.9 4.7 4.7 4.4 4.4  ?CL 103 100 99 100 100  ?CO2 24 32 32 34* 32  ?GLUCOSE 180* 224* 230* 130* 189*  ?BUN '19 21 23 23 ' 24*  ?CREATININE 0.60 0.68 0.63 0.69 0.69  ?CALCIUM 8.8* 9.2 9.8 9.7 9.7  ?MG 1.8 1.9 1.7 1.9 1.7  ?PHOS 2.9 3.2 3.6 4.3 4.1  ? ? ?CBC ?Recent Labs  ?Lab 07/04/21 ?0319 07/05/21 ?2162 07/06/21 ?0430  ?HGB 11.0* 11.3* 11.1*  ?HCT 35.4* 36.8 36.1  ?WBC 7.5 5.9 6.7  ?PLT 186 185 188  ? ? ?Anti-Coagulation ?No results for input(s): INR in the last 168 hours. ? ? ? ? ? ? ? ?Allergies as of 07/06/2021   ? ?   Reactions  ? Shellfish Allergy  Shortness Of Breath  ? respitatory distress   ? Sodium Sulfite Shortness Of Breath  ? Severe Congestion that creates inability to breath  ? Sulfa Antibiotics Shortness Of Breath, Other (See Comments)  ? respit

## 2021-07-05 NOTE — Discharge Summary (Incomplete Revision)
Physician Discharge Summary  ?Patient ID: ?Angela David ?MRN: [id number] ?DOB/AGE: 08-31-1940 81 y.o. ? ?Admit date: 06/26/2021 ?Discharge date: 07/05/2021 ? ? ?Brief Pt Description / Synopsis:  ?81 y.o. 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 ? ?Discharge Diagnoses:  ? 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                                                ?          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 ?  ?Patient was recently discharged from the hospital on 03/06/2021 to [facility]  following prolonged admission for Acute on Chronic Hypoxic & Respiratory Failure Secondary to suspected aspiration pneumonia, ? healthcare associated pneumonia (? COLONIZATION) and influenza A viral infection and sepsis due to aspiration pneumonia and suspected abdominal abscess. Per ED notes, patient was discharged from [facility] yesterday evening and was placed on new home vent for approximately 3 hours when she developed respiratory distress. Per patient's husband, altered, diaphoretic and unresponsive therefore EMS was called. ?  ?ED Course: In the emergency department, the temperature was 37.1?C, the heart rate 120 beats/minute, the blood pressure  109/47 mm Hg, the respiratory rate 21 breaths/minute, and the oxygen saturation 94 % on TC '@28' %.  Stat chest  x-ray was obtained and showed mild right basilar atelectasis CT angio negative for acute pulmonary embolism.  CT of the abdomen and pelvis with no acute abnormality. ?Pertinent Labs in Red/Diagnostics Findings: ?Na+/ K+: 8/3.9 ?Glucose: 235 ?BUN/Cr.:  33/0.86 ?  ?WBC/ TMAX: 13.5/ afebrile ?PCT: negative <0.10 ?Lactic acid: 2.0 ?COVID PCR: Negative ?Arterial Blood Gas result:  pO2 62; pCO2 47; pH 7.49;  HCO3 35.8, %O2 Sat 95.7.  ?  ?Patient was placed on the ventilator with a tidal volume of 450 and rate of 24.  She received IV fluids and was started on IV antibiotics for possible urosepsis.  PCCM consulted for vent management ? ? ?Discharge Plan by Diagnosis:  ? 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  ?Sepsis due to ESBL klebsiella pneumoniae UTI ~ TREATED ?Frequent urination  ?Met sepsis criteria on admission ?- Monitor fever curve ?- Trend WBC's & Procalcitonin ?- Follow cultures as above ?- Received cipro x4 doses ?- Urology evaluated ~ Recommendations "-Obtain catheterized urine samples for UA and culture moving forward when there is clinical suspicion for UTI ?-Continue  tight glycemic control ?-Continue bowel regimen for chronic constipation ?-Start daily cranberry, d-mannose, and lactobacillus-containing probiotics ?-Start topical vaginal estrogen cream" ?   ?Diabetes mellitus      ?- CBG's ac& qhs ?- Resume home insulin regimen ?   ?Nutritional Status/Diet ?- Continue TF per PEG as tolerated pending Dietician recs ?- Dietician consult ?- Constipation protocol as  indicated ?   ?Hypomagnesia ~ RESOLVED ?- Trend BMP  ?- Pharmacy consulted: replace electrolytes as indicated ? ? ?Significant Events:  ?5/2: Admitted to the ICU with possible sepsis secondary to UTI and chronic vent management ?5/3: Weaned off vent to trach collar.  Eating and drinking.  Will trial home ventilator/trilogy at night.  Urine culture positive for Klebsiella pneumoniae, sensitivities pending.  [facility] evaluating home resources in anticipation of discharge soon ?5/4: Urine culture sensitivities positive for ESBL Klebsiella pneumoniae UTI.  Antibiotics changed to ciprofloxacin.  Patient with soft blood pressure responsive to IV fluids, suspect dehydration due to poor p.o. intake.  Became hypoxic when working with PT. Not ready for discharge at this time ?5/5: Patient admitted with Acute on Chronic Hypoxic Respiratory Failure and sepsis due to Klebsiella pneumoniae (ESBL ) UTI. She was being discharged home on 06/30/21  by EMS when she developed respiratory distress and turned blue prior to leaving the hospital. EMS immediately began bagging pt via BVM and returned pt to the unit. Patient was placed back on previous vent setting with immediate improvement noted. Discharge cancelled pending further evaluation. Patient's husband notified. ?5/7: Alert and awake, vent at night ?5/8: Working on discharge planning.  Requesting husband to bring in home ventilator for qhs use and as needed to assess if pt will tolerate  ?5/9: Unable to place pt on home vent overnight due to a piece of equipment missing, therefore unable to discharge home today.  Will contact TOC, pts husband requesting [employer] representative come to his home to make sure he has all the equipment needed for the home ventilator at discharge  ?5/10: Pt BP soft today, reports poor PO intake.  Will give 1L NS bolus ~ BP fluid responsive. Hopeful for discharge today, awaiting confirmation pt has all available all needed home resources                ? ? ?Significant Diagnostic Studies:  ?5/2: CTA abdomen and pelvis> no abnormality ?5/2: CTA Chest> no evidence of acute pulmonary embolism ?          ?Micro Data:  ?5/2: Blood culture x2>negative  ?5/2: Urine Culture> Klebsiella pneumoniae (ESBL) ~sensitive to ciprofloxacin, gentamicin, imipenem, Zosyn ?5/2: MRSA PCR>>negative  ? ?Antimicrobials:  ?Ceftriaxone 5/2>>5/4 ?Ciprofloxacin 5/4>>5/6 ? ?Consults:  ?PCCM ? ?Discharge Exam:  ?GENERAL: Chronically ill appearing patient female resting in bed, NAD on mechanical ventilator via chronic tracheostomy ?HEENT: Supple, no JVD, chronic size 6 mm shiley tracheostomy midline dressing dry/intact  ?LUNGS: Clear throughout, even, non labored  ?CARDIOVASCULAR: NSR, s1s2, no r/g, 2+ radial/1+ distal pulses, no edema  ?ABDOMEN: +BS x4, soft, nontender, nondistended. No organomegaly or mass. PEG in place.  ?EXTREMITIES: Normal bulk and tone, moves all extremities  ?SKIN: No obvious rash, lesion, or ulcer.  ?  ?Vitals:  ? 07/05/21 1115 07/05/21 1200 07/05/21 1300 07/05/21 1358  ?BP: (!) 104/45 (!) 125/57 (!) 155/58   ?Pulse: 80 81 79 86  ?Resp: 20     ?Temp:    98.6 ?F (37 ?C)  ?TempSrc:    Oral  ?SpO2: 95% 95% 97% 94%  ?Weight:      ?  Height:      ? ? ? ?Discharge Labs:  ? ?BMET ?Recent Labs  ?Lab 07/01/21 ?0429 07/02/21 ?0510 07/03/21 ?0330 07/04/21 ?0319 07/05/21 ?0408  ?NA 138 137 137 136 139  ?K 5.0 4.9 4.7 4.7 4.4  ?CL 106 103 100 99 100  ?CO2 26 24 32 32 34*  ?GLUCOSE 207* 180* 224* 230* 130*  ?BUN '19 19 21 23 23  ' ?CREATININE 0.63 0.60 0.68 0.63 0.69  ?CALCIUM 8.5* 8.8* 9.2 9.8 9.7  ?MG 2.1 1.8 1.9 1.7 1.9  ?PHOS 3.6 2.9 3.2 3.6 4.3  ? ? ?CBC ?Recent Labs  ?Lab 07/03/21 ?0330 07/04/21 ?0319 07/05/21 ?5093  ?HGB 10.2* 11.0* 11.3*  ?HCT 33.7* 35.4* 36.8  ?WBC 6.6 7.5 5.9  ?PLT 170 186 185  ? ? ?Anti-Coagulation ?No results for input(s): INR in the last 168 hours. ? ? ? ? ? ? ? ?Allergies as of 07/05/2021   ? ?   Reactions  ? Shellfish Allergy Shortness Of Breath  ?  respitatory distress   ? Sodium Sulfite Shortness Of Breath  ? Severe Congestion that creates inability to breath  ? Sulfa Antibiotics Shortness Of Breath, Other (See Comments)  ? respitatory distress  ? Codeine Other (See

## 2021-07-06 ENCOUNTER — Other Ambulatory Visit (HOSPITAL_COMMUNITY): Payer: Self-pay

## 2021-07-06 DIAGNOSIS — J9621 Acute and chronic respiratory failure with hypoxia: Secondary | ICD-10-CM | POA: Diagnosis not present

## 2021-07-06 DIAGNOSIS — J9622 Acute and chronic respiratory failure with hypercapnia: Secondary | ICD-10-CM | POA: Diagnosis not present

## 2021-07-06 LAB — CBC
HCT: 36.1 % (ref 36.0–46.0)
Hemoglobin: 11.1 g/dL — ABNORMAL LOW (ref 12.0–15.0)
MCH: 27.3 pg (ref 26.0–34.0)
MCHC: 30.7 g/dL (ref 30.0–36.0)
MCV: 88.7 fL (ref 80.0–100.0)
Platelets: 188 10*3/uL (ref 150–400)
RBC: 4.07 MIL/uL (ref 3.87–5.11)
RDW: 13.7 % (ref 11.5–15.5)
WBC: 6.7 10*3/uL (ref 4.0–10.5)
nRBC: 0 % (ref 0.0–0.2)

## 2021-07-06 LAB — GLUCOSE, CAPILLARY
Glucose-Capillary: 206 mg/dL — ABNORMAL HIGH (ref 70–99)
Glucose-Capillary: 134 mg/dL — ABNORMAL HIGH (ref 70–99)
Glucose-Capillary: 190 mg/dL — ABNORMAL HIGH (ref 70–99)
Glucose-Capillary: 203 mg/dL — ABNORMAL HIGH (ref 70–99)

## 2021-07-06 LAB — PHOSPHORUS: Phosphorus: 4.1 mg/dL (ref 2.5–4.6)

## 2021-07-06 LAB — BASIC METABOLIC PANEL
Anion gap: 4 — ABNORMAL LOW (ref 5–15)
BUN: 24 mg/dL — ABNORMAL HIGH (ref 8–23)
CO2: 32 mmol/L (ref 22–32)
Calcium: 9.7 mg/dL (ref 8.9–10.3)
Chloride: 100 mmol/L (ref 98–111)
Creatinine, Ser: 0.69 mg/dL (ref 0.44–1.00)
GFR, Estimated: >60 mL/min (ref >60)
Glucose, Bld: 189 mg/dL — ABNORMAL HIGH (ref 70–99)
Potassium: 4.4 mmol/L (ref 3.5–5.1)
Sodium: 136 mmol/L (ref 135–145)

## 2021-07-06 LAB — MAGNESIUM: Magnesium: 1.7 mg/dL (ref 1.7–2.4)

## 2021-07-06 MED ORDER — FREESTYLE LIBRE 2 SENSOR MISC: 1.0000 | Freq: Every day | 1 refills | Status: AC

## 2021-07-06 MED ORDER — INSULIN GLARGINE 100 UNIT/ML SOLOSTAR PEN: 10.0000 [IU] | PEN_INJECTOR | Freq: Every day | SUBCUTANEOUS | 11 refills | Status: DC

## 2021-07-06 MED ORDER — DIPHENHYDRAMINE HCL 50 MG/ML IJ SOLN
25.0000 mg | Freq: Once | INTRAMUSCULAR | Status: AC
  Administered 2021-07-06: 25 mg via INTRAVENOUS
  Filled 2021-07-06: qty 1

## 2021-07-06 MED ORDER — FAMOTIDINE 20 MG PO TABS
20.0000 mg | ORAL_TABLET | Freq: Once | ORAL | Status: AC
  Administered 2021-07-06: 20 mg
  Filled 2021-07-06: qty 1

## 2021-07-06 MED ORDER — "PEN NEEDLES 3/16"" 31G X 5 MM MISC": 1.0000 | Freq: Every day | 1 refills | Status: AC

## 2021-07-06 MED ORDER — INSULIN GLARGINE-YFGN 100 UNIT/ML ~~LOC~~ SOLN
10.0000 [IU] | Freq: Once | SUBCUTANEOUS | Status: AC
  Administered 2021-07-06: 10 [IU] via SUBCUTANEOUS
  Filled 2021-07-06: qty 0.1

## 2021-07-06 NOTE — TOC Transition Note (Signed)
Transition of Care (TOC) - CM/SW Discharge Note ? ? ?Patient Details  ?Name: Miangel Flom ?MRN: [id number] ?Date of Birth: June 09, 1940 ? ?Transition of Care (TOC) CM/SW Contact:  ?Shelbie Hutching, RN ?Phone Number: ?07/06/2021, 2:58 PM ? ? ?Clinical Narrative:    ?Patient and husband are comfortable with all of the education they have received and are ready to be discharged.  Medically cleared.  RNCM has arranged EMS transport.  Patient is 2nd on the list for pick up.   ? ? ?Final next level of care: [place] ?Barriers to Discharge: Barriers Resolved ? ? ?Patient Goals and CMS Choice ?Patient states their goals for this hospitalization and ongoing recovery are:: to go home ?CMS Medicare.gov Compare Post Acute Care list provided to:: Patient ?Choice offered to / list presented to : Patient ? ?Discharge Placement ?  ?           ?  ?Patient to be transferred to facility by: [facility] to Transport home ?Name of family member notified: [name] ?Patient and family notified of of transfer: 07/05/21 ? ?Discharge Plan and Services ?  ?Discharge Planning Services: CM Consult ?Post Acute Care Choice: Home Health, Resumption of Svcs/PTA Provider          ?DME Arranged: Nebulizer machine, Nebulizer/meds, Other see comment (trach humidification) ?DME Agency: [employer] ?Date DME Agency Contacted: 07/05/21 ?Time DME Agency Contacted: 1000 ?Representative spoke with at DME Agency: Thedore Mins ?HH Arranged: RN, PT, OT, Social Work ?[place] Agency: [place] ([place] ?Date HH Agency Contacted: 07/05/21 ?Time [place]: [id number] ?Representative spoke with at [place]: Floydene Flock ? ?Social Determinants of Health (SDOH) Interventions ?  ? ? ?Readmission Risk Interventions ? ?  06/30/2021  ?  2:19 PM 02/23/2021  ? 12:23 PM  ?Readmission Risk Prevention Plan  ?Transportation Screening Complete Complete  ?PCP or Specialist Appt within 3-5 Days  Complete  ?[place] or Home Care Consult  Complete  ?Social Work  Consult for [place] Planning/Counseling  Complete  ?Palliative Care Screening  Not Applicable  ?Medication Review Press photographer) Complete Complete  ?PCP or Specialist appointment within 3-5 days of discharge Complete   ?[place] or Home Care Consult Complete   ?SW Recovery Care/Counseling Consult Complete   ?Palliative Care Screening Complete   ?[place] Not Applicable   ? ? ? ? ? ?

## 2021-07-06 NOTE — Progress Notes (Signed)
Inpatient Diabetes Program Recommendations ? ?AACE/ADA: New Consensus Statement on Inpatient Glycemic Control (2015) ? ?Target Ranges:  Prepandial:   less than 140 mg/dL ?     Peak postprandial:   less than 180 mg/dL (1-2 hours) ?     Critically ill patients:  140 - 180 mg/dL  ? ?Lab Results  ?Component Value Date  ? GLUCAP 190 (H) 07/06/2021  ? HGBA1C 6.1 (H) 06/27/2021  ? ? ?Review of Glycemic Control ? Latest Reference Range & Units 07/05/21 23:16 07/06/21 03:48 07/06/21 07:55 07/06/21 11:49  ?Glucose-Capillary 70 - 99 mg/dL 167 (H) 206 (H) 134 (H) 190 (H)  ? ?Diabetes history: DM 2 ?Outpatient Diabetes medications:  ?Semglee/Novolog- patient was not taking this at home ?Current orders for Inpatient glycemic control:  ?Novolog sensitive q 4 hours ?Semglee 10 units daily ? ?Inpatient Diabetes Program Recommendations:   ? ?Note plans for discharge home.  Patient has been in a facility most of the year and therefore husband has never administered insulin to her.  Discussed with both of them the importance of glycemic control to prevent infections and complications. Reviewed hypoglycemia signs, symptoms, and treatment as well.  ? ?Educated patient and spouse on insulin pen use at home. Reviewed all steps if insulin pen including attachment of needle, 2-unit air shot, dialing up dose, giving injection, removing needle, disposal of sharps, storage of unused insulin, disposal of insulin etc.  Patient able to provide successful return demonstration.  Also reviewed troubleshooting with insulin pen.  MD to give patient Rxs for insulin pens and insulin pen needles.  ? ?MD ordered application of Freestyle CGM at discharge for patient. Education done regarding application and changing CGM sensor (alternate every 14 days on back of arms), 1 hour warm-up, use of glucometer when alert displays, how to scan CGM for glucose reading and information for PCP. Patient has also been given educational packet regarding use CGM sensor  including the 1-800 toll free number for any questions, problems or needs related to the [facility] sensors or reader.    Sensor applied by patients husband to  L Arm at 12:30p.  Explained that glucose readings will not be available until 1 hour after application. Reviewed use of CGM including how to scan, changing Sensor, Vitamin C warning, arrows with glucose readings, and Freestyle app.   ? ?Called and also spoke with patient's Niece Edmonia Lynch.  She has diabetes and will be able to help patient and her uncle with troubleshooting and managing DM as well.  ?Hopefully Home health can help too. Patient will only be on one shot per day.   ? ?Thanks,  ?Adah Perl, RN, BC-ADM ?Inpatient Diabetes Coordinator ?Pager [phone]  (8a-5p) ? ?

## 2021-07-06 NOTE — Progress Notes (Signed)
Patient discharged to home via EMS.  Report given to EMS personnel and all questions answered.  Patient alert and oriented x4 on Trach collar at 5L.  AVS reviewed with patient and patient spouse including all medications, pharmacies, and when to notify MD.  Patient and patient's spouse verbalized understanding and had no additional questions. ?

## 2021-07-06 NOTE — Progress Notes (Signed)
OT Cancellation Note ? ?Patient Details ?Name: Angela David ?MRN: [id number] ?DOB: 08-May-1940 ? ? ?Cancelled Treatment:    Reason Eval/Treat Not Completed: Other (comment) (Pt refused. Education provided re: importance of oob mobility as pt has refused out of bed since admission.  Pt continued to refuse despite education. OT will reattempt as appropriate.) ? ?Shanon Payor, OTD OTR/L  ?07/06/21, 2:50 PM  ?

## 2021-07-06 NOTE — TOC Benefit Eligibility Note (Signed)
Patient Advocate Encounter ? ?Insurance verification completed.   ? ?The patient is currently admitted and upon discharge could be taking Lantus Solostar Pen. ? ?The current 30 day co-pay is, $11.00.  ? ?The patient is insured through [employer] Part D  ? ? ? ?Lyndel Safe, CPhT ?Pharmacy Patient Advocate Specialist ?[place] Patient Advocate Team ?Direct Number: [phone]  Fax: [phone] ? ? ? ? ? ?  ?

## 2021-07-06 NOTE — Progress Notes (Signed)
PT Cancellation Note ? ?Patient Details ?Name: Angela David ?MRN: [id number] ?DOB: 06/30/1940 ? ? ?Cancelled Treatment:    Reason Eval/Treat Not Completed: Patient declined, no reason specified. PT to continue with attempts as appropriate.  ? ?Minna Merritts, PT, MPT ? ?Angela David ?07/06/2021, 3:35 PM ?

## 2021-07-06 NOTE — Progress Notes (Signed)
? ? ?NAME:  Angela David, MRN:  [id number], DOB:  [birth year], LOS: 9 ?ADMISSION DATE:  06/26/2021, CONSULTATION DATE:  06/27/2021 ?REFERRING MD:  Pryor Curia MD  CHIEF COMPLAINT: Unresponsiveness  ? ? 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 ? ?Patient was recently discharged from the hospital on 03/06/2021 to [facility]  following prolonged admission for Acute on Chronic Hypoxic & Respiratory Failure Secondary to suspected aspiration pneumonia, ? healthcare associated pneumonia (? COLONIZATION) and influenza A viral infection and sepsis due to aspiration pneumonia and suspected abdominal abscess. Per ED notes, patient was discharged from [facility] yesterday evening and was placed on new home vent for approximately 3 hours when she developed respiratory distress. Per patient's husband, altered, diaphoretic and unresponsive therefore EMS was called. ? ?ED Course: In the emergency department, the temperature was 37.1?C, the heart rate 120 beats/minute, the blood pressure  109/47 mm Hg, the respiratory rate 21 breaths/minute, and the oxygen saturation 94 % on TC '@28'$ %.  Stat chest x-ray was obtained and showed mild right basilar atelectasis CT angio negative for acute pulmonary embolism.  CT of the abdomen and pelvis with no acute abnormality. ?Pertinent Labs in Red/Diagnostics Findings: ?Na+/ K+: 8/3.9 ?Glucose: 235 ?BUN/Cr.:  33/0.86 ?  ?WBC/ TMAX: 13.5/ afebrile ?PCT: negative <0.10 ?Lactic acid: 2.0 ?COVID PCR: Negative ?Arterial Blood Gas result:  pO2 62; pCO2 47; pH 7.49;  HCO3 35.8, %O2 Sat 95.7.  ? ?Patient was placed on the ventilator with a tidal volume of 450 and rate of 24.  She  received IV fluids and was started on IV antibiotics for possible urosepsis.  PCCM consulted for vent management ? ?Past Medical History  ? 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 ?Significant Hospital Events   ?5/2: Admitted to the ICU with possible sepsis secondary to UTI and chronic vent management ?5/3: Weaned off vent to trach collar.  Eating and drinking.  Will trial home ventilator/trilogy at night.  Urine culture positive for Klebsiella pneumoniae, sensitivities pending.  [facility] evaluating home resources in anticipation of discharge soon ?5/4: Urine culture sensitivities positive for ESBL Klebsiella pneumoniae UTI.  Antibiotics changed to ciprofloxacin.  Patient with soft blood pressure responsive to IV fluids, suspect dehydration due to poor p.o. intake.  Became hypoxic when working with PT. Not ready for discharge at this time ?5/5: Patient admitted with Acute on Chronic Hypoxic Respiratory Failure and sepsis due to Klebsiella pneumoniae (ESBL ) UTI. She was being discharged home on 06/30/21  by EMS when she developed respiratory distress and turned blue prior to leaving the hospital.  EMS immediately began bagging pt via BVM and returned pt to the unit. Patient was placed back on previous vent setting with immediate improvement noted. Discharge cancelled pending further evaluation. Patient's husband notified. ?5/7: Alert and awake,  vent at night ?5/8: Working on discharge planning.  Requesting husband to bring in home ventilator for qhs use and as needed to assess if pt will tolerate  ?5/9: Unable to place pt on home vent overnight due to a piece of equipment missing, therefore unable to discharge home today.  Will contact TOC, pts husband requesting [employer] representative come to his home to make sure he has all the equipment needed for the home ventilator at discharge  ?5/10: Pt BP soft today, reports poor PO intake.  Will give 1L NS bolus ~ BP fluid responsive. Hopeful for discharge today, awaiting confirmation pt has all available all needed home resources ?5/11: Diabetes Coordinator assisting with home insulin regimen and educating husband on insulin administration.  Once husband comfortable with Diabetes management can d/c home ? ?Consults:  ?PCCM ?Diabetes Coordinator ? ?Procedures:  ?None ? ?Significant Diagnostic Tests:  ?5/2: CTA abdomen and pelvis> no abnormality ?5/2: CTA Chest> no evidence of acute pulmonary embolism ? ?Micro Data:  ?5/2: Blood culture x2>negative  ?5/2: Urine Culture> Klebsiella pneumoniae ?5/2: MRSA PCR>>negative  ? ?Antimicrobials:  ?Ceftriaxone 5/2>>5/4 ?Ciprofloxacin 5/4>>5/6 ? ?OBJECTIVE  ?Blood pressure (!) 108/45, pulse 76, temperature 98.2 ?F (36.8 ?C), temperature source Axillary, resp. rate 17, height '5\' 4"'$  (1.626 m), weight 73.4 kg, SpO2 96 %. ?   ?Vent Mode: PRVC ?FiO2 (%):  [28 %-35 %] 35 % ?Set Rate:  [15 bmp] 15 bmp ?Vt Set:  [450 mL] 450 mL ?PEEP:  [5 cmH20] 5 cmH20 ?Plateau Pressure:  [16 cmH20] 16 cmH20  ? ?Intake/Output Summary (Last 24 hours) at 07/06/2021 0838 ?Last data filed at 07/06/2021 0500 ?Gross per 24 hour  ?Intake 740 ml  ?Output 1625 ml  ?Net -885 ml  ? ? ?Filed Weights  ? 07/02/21 0341 07/03/21 0500 07/04/21 0500  ?Weight: 75.1 kg 75.1 kg 73.4 kg  ? ?Physical Examination  ?GENERAL: Chronically ill appearing patient female resting in bed, NAD on mechanical ventilator via chronic  tracheostomy ?HEENT: Supple, no JVD, chronic size 6 mm shiley tracheostomy midline dressing dry/intact  ?LUNGS: Clear throughout, even, non labored  ?CARDIOVASCULAR: NSR, s1s2, no r/g, 2+ radial/1+ distal pulses, no edema  ?ABDOMEN: +BS x4, soft, nontender, nondistended. No organomegaly or mass. PEG in place.  ?EXTREMITIES: Normal bulk and tone, moves all extremities  ?SKIN: No obvious rash, lesion, or ulcer.  ? ?Labs/imaging that I havepersonally reviewed  ?(right click and "Reselect all SmartList Selections" daily)  ? ?  ?Labs   ?CBC: ?Recent Labs  ?Lab 07/02/21 ?9833 07/03/21 ?0330 07/04/21 ?0319 07/05/21 ?8250 07/06/21 ?0430  ?WBC 6.6 6.6 7.5 5.9 6.7  ?NEUTROABS  --   --  5.7 3.9  --   ?HGB 10.3* 10.2* 11.0* 11.3* 11.1*  ?HCT 33.7* 33.7* 35.4* 36.8 36.1  ?MCV 88.0 88.9 87.0 87.2 88.7  ?PLT 143* 170 186 185 188  ? ? ? ?Basic Metabolic Panel: ?Recent Labs  ?Lab 07/02/21 ?0510 07/03/21 ?0330 07/04/21 ?0319 07/05/21 ?0408 07/06/21 ?0430  ?NA 137 137 136 139 136  ?K 4.9 4.7 4.7 4.4 4.4  ?CL 103 100 99 100 100  ?CO2 24 32 32 34* 32  ?GLUCOSE 180* 224* 230* 130* 189*  ?BUN '19 21 23 23 '$ 24*  ?CREATININE 0.60 0.68 0.63 0.69 0.69  ?CALCIUM 8.8*  9.2 9.8 9.7 9.7  ?MG 1.8 1.9 1.7 1.9 1.7  ?PHOS 2.9 3.2 3.6 4.3 4.1  ? ? ?GFR: ?Estimated Creatinine Clearance: 56.9 mL/min (by C-G formula based on SCr of 0.69 mg/dL). ?Recent Labs  ?Lab 06/29/21 ?1225 06/29/21 ?1547 06/30/21 ?0409 07/03/21 ?0330 07/04/21 ?0319 07/05/21 ?8466 07/06/21 ?0430  ?WBC  --   --    < > 6.6 7.5 5.9 6.7  ?LATICACIDVEN 2.0* 1.3  --   --   --   --   --   ? < > = values in this interval not displayed.  ? ? ? ?Liver Function Tests: ?No results for input(s): AST, ALT, ALKPHOS, BILITOT, PROT, ALBUMIN in the last 168 hours. ? ?No results for input(s): LIPASE, AMYLASE in the last 168 hours. ?No results for input(s): AMMONIA in the last 168 hours. ? ?ABG ?   ?Component Value Date/Time  ? PHART 7.49 (H) 06/26/2021 2205  ? PCO2ART 47 06/26/2021 2205  ? PO2ART 62 (L)  06/26/2021 2205  ? HCO3 35.8 (H) 06/26/2021 2205  ? ACIDBASEDEF 0.9 01/19/2020 1024  ? O2SAT 95.7 06/26/2021 2205  ? ?  ? ?Coagulation Profile: ?No results for input(s): INR, PROTIME in the last 168 hours. ? ?Ca

## 2021-08-09 ENCOUNTER — Other Ambulatory Visit: Payer: Self-pay

## 2021-08-09 ENCOUNTER — Emergency Department: Payer: Medicare Other

## 2021-08-09 ENCOUNTER — Emergency Department
Admission: EM | Admit: 2021-08-09 | Discharge: 2021-08-09 | Disposition: A | Discharge status: Home / Self Care | Payer: Medicare Other | Attending: Emergency Medicine | Admitting: Emergency Medicine

## 2021-08-09 DIAGNOSIS — R197 Diarrhea, unspecified: Secondary | ICD-10-CM | POA: Diagnosis present

## 2021-08-09 DIAGNOSIS — E119 Type 2 diabetes mellitus without complications: Secondary | ICD-10-CM | POA: Diagnosis not present

## 2021-08-09 DIAGNOSIS — Z853 Personal history of malignant neoplasm of breast: Secondary | ICD-10-CM | POA: Insufficient documentation

## 2021-08-09 DIAGNOSIS — N959 Unspecified menopausal and perimenopausal disorder: Secondary | ICD-10-CM | POA: Insufficient documentation

## 2021-08-09 DIAGNOSIS — K9423 Gastrostomy malfunction: Secondary | ICD-10-CM | POA: Diagnosis not present

## 2021-08-09 DIAGNOSIS — T85528A Displacement of other gastrointestinal prosthetic devices, implants and grafts, initial encounter: Secondary | ICD-10-CM

## 2021-08-09 NOTE — ED Provider Notes (Signed)
[facility] Provider Note    Event Date/Time   First MD Initiated Contact with Patient 08/09/21 1603     (approximate)   History   G-tube Problem    HPI  Angela David is a 81 y.o. female past medical history of vocal cord paralysis status post chronic trach and PEG [place] with issue with the G-tube.  Patient is currently being treated for G-tube cellulitis saw her outpatient provider yesterday who put some topical therapy on it and then started her on antibiotic.  She is also being treated for UTI.  Today the G-tube seem to just pop out with the balloon intact.  Family denies any pulling on it.  She has had some ongoing abdominal pain this is chronic for her.  Endorses some mild diarrhea no nausea vomiting otherwise she is at her baseline.     Past Medical History:  Diagnosis Date   Breast cancer, right ([place]) 2002   Tx'd with chemotherapy and XRT   Chronic respiratory failure ([place])    s/p tracheostomy in 2012; on ventilatory support at hs   Diabetic peripheral neuropathy ([place]) 08/28/2016   GERD (gastroesophageal reflux disease)    Glaucoma    High cholesterol    History of colon polyps    Osteoarthritis    Personal history of chemotherapy 2002   BREAST CA   Personal history of radiation therapy 2002   BREAST CA   S/P percutaneous endoscopic gastrostomy (PEG) tube placement (HCC)    T2DM (type 2 diabetes mellitus) ([place])    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   Ventral hernia    Vocal cord paralysis 2012    Patient Active Problem List   Diagnosis Date Noted   Acute on chronic respiratory failure with hypoxia and hypercapnia ([place]) 06/27/2021   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   Acute pulmonary edema (HCC)    Somnolence    Sepsis ([place]) 02/20/2021   Abdominal wall abscess    Chronic respiratory failure with hypoxia (HCC) 03/29/2020   Chronic diastolic CHF (congestive heart failure) ([place]) 01/31/2020   Uncontrolled type 2  diabetes mellitus with hyperglycemia ([place]) 01/31/2020   AF (paroxysmal atrial fibrillation) ([place]) 01/31/2020   Aspiration pneumonia of both lower lobes due to gastric secretions ([place]) 92/42/6834   Acute metabolic encephalopathy 19/62/2297   Acute respiratory failure ([place]) 01/23/2020   Left lower lobe pneumonia 01/16/2020   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   Weakness    Hypoxia    Acute respiratory failure with hypoxia and hypercapnia ([place]) 03/21/2019   Hypotension 03/21/2019   Acute encephalopathy 03/21/2019   Multiple fractures of ribs, right side, initial encounter for closed fracture 12/25/2018   Colon polyp 11/06/2018   Esophageal dysphagia    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    Stricture and stenosis of esophagus    Elevated CEA 09/02/2018   Goals of care, counseling/discussion 09/02/2018   Polyp of transverse colon 09/02/2018   Foreign body in right foot 05/26/2018   History of tracheostomy 04/30/2018   Moderate episode of recurrent major depressive disorder ([place]) 04/03/2018   Poor balance 04/03/2018   Shoulder lesion, right 02/28/2017   Cervical radiculopathy 09/28/2016   Peripheral neuropathy 08/28/2016   Diabetic peripheral neuropathy ([place]) 08/28/2016   Leg weakness, bilateral 08/13/2016   Gait abnormality 08/13/2016   Carcinoma of overlapping sites of right breast in female, estrogen receptor negative ([place]) 12/27/2015  Malignant neoplasm of breast ([place]) 03/28/2015   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   Hypercholesterolemia 03/28/2015   Recurrent major depressive disorder, in partial remission ([place]) 03/01/2015   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   Cephalalgia 09/15/2014   Headache due to trauma 09/15/2014   Osteopenia 01/07/2014   Age-related osteoporosis without current pathological fracture 01/07/2014   History  of colonic polyps 12/14/2013   History of breast cancer 11/26/2012   H/O malignant neoplasm of breast 11/26/2012   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   Tracheostomy present ([place]) 12/06/2011   Calcification of bronchus or trachea 01/16/2011   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 --      Excl. in [place]? --     Most recent vital signs: Vitals:   08/09/21 1606  BP: (!) 106/44  Pulse: 71  Resp: 18  Temp: 98.1 F (36.7 C)  SpO2: 93%     General: Awake, no distress.  CV:  Good peripheral perfusion.  Resp:  Normal effort.  Tracheostomy in place Abd:  No distention.  Abdomen is soft mild tenderness diffusely, G-tube is displaced with balloon intact, there is some mild erythema surrounding the G-tube with some serous drainage no purulence no crepitus Neuro:             Awake, Alert, Oriented x 3  Other:     ED Results / Procedures / Treatments  Labs (all labs ordered are listed, but only abnormal results are displayed) Labs Reviewed - No data to display   EKG    RADIOLOGY CT abdomen pelvis reviewed and interpreted by myself shows G-tube in appropriate position   PROCEDURES:  Critical Care performed: No  Gastrostomy tube replacement  Date/Time: 08/09/2021 6:50 PM  Performed by: Rada Hay, MD Authorized by: Rada Hay, MD  Consent: Verbal consent obtained. Consent given by: patient and  spouse Patient identity confirmed: verbally with patient Local anesthesia used: no  Anesthesia: Local anesthesia used: no  Sedation: Patient sedated: no  Patient tolerance: patient tolerated the procedure well with no immediate complications Comments: 54 French G-tube placed without issue.  Balloon inflated with 5 cc of fluid.  CT abdomen pelvis obtained and shows appropriate position     The patient is on the cardiac monitor to evaluate for evidence of arrhythmia and/or significant heart rate changes.   MEDICATIONS ORDERED IN ED: Medications - No data to display   IMPRESSION / MDM / [place] / ED COURSE  I reviewed the triage vital signs and the nursing notes.  Patient's presentation is most consistent with acute complicated illness / injury requiring diagnostic workup.  Differential diagnosis includes, but is not limited to, G-tube dislodgment secondary to retraction, abnormality of the stomach wall, ventral hernia  Is a 81 year old female who has a PEG tube who presents after the tube was dislodged.  She says that it just popped out on its own.  Of note she was started on antibiotic by her outpatient provider due to a cellulitis around the G-tube site.  They deny her tube being pulled out.  I see that the balloon is intact she has an 82 [place] tube.  Largest size that we were able to get access to in the ED with [place].  This was replaced without issue.  Balloon was inflated and traction pulled and there was no leakage and no further dislodgment of the tube.  Given the questionable story about the tube popping out I did obtain a CT of the abdomen and pelvis to evaluate the integrity of the stomach wall, which confirms that the tube is in the correct position there does appear to be some laxity of the abdominal wall the stomach extends into no other obvious pathology.  We will have patient follow-up with Dr. Kirstie Mirza who was the surgeon who  placed the 2.  Discussed continued antibiotic she is already on.  Otherwise she is appropriate for discharge.     FINAL CLINICAL IMPRESSION(S) / ED DIAGNOSES   Final diagnoses:  Dislodged gastrostomy tube     Rx / DC Orders   ED Discharge Orders     None        Note:  This document was prepared using Dragon voice recognition software and may include unintentional dictation errors.   Rada Hay, MD 08/09/21 [phone]

## 2021-08-09 NOTE — ED Triage Notes (Addendum)
Pt to ED via [facility] from home. Pt accidentally pulled out her peg tube. Pt with trach in place and A&Ox4. Pt denies any fever, abdominal pain, N/V/D.   EMS BP 100/64 and states pt normally runs lower. All other VS WNL.

## 2021-08-09 NOTE — Discharge Instructions (Signed)
We replaced the G-tube with a smaller 16 [place] tube.  Please follow-up with Dr. Dahlia Byes.  If it comes out again please return to the emergency department.  Please continue the antibiotic that you were given for the cellulitis around the tube site.

## 2021-08-12 IMAGING — CR DG CHEST 2V
1 series · 2 of 2 positions shown · non-contrast
Comparison: 03/28/2019

CLINICAL DATA: Increased secretions from tracheostomy tube,
possible mild positioning of tracheostomy tube

EXAM:
CHEST - 2 VIEW

[Series 1: w chest pa · 0.14mm/px · 2 of 2 slices shown]
[im 1/2]
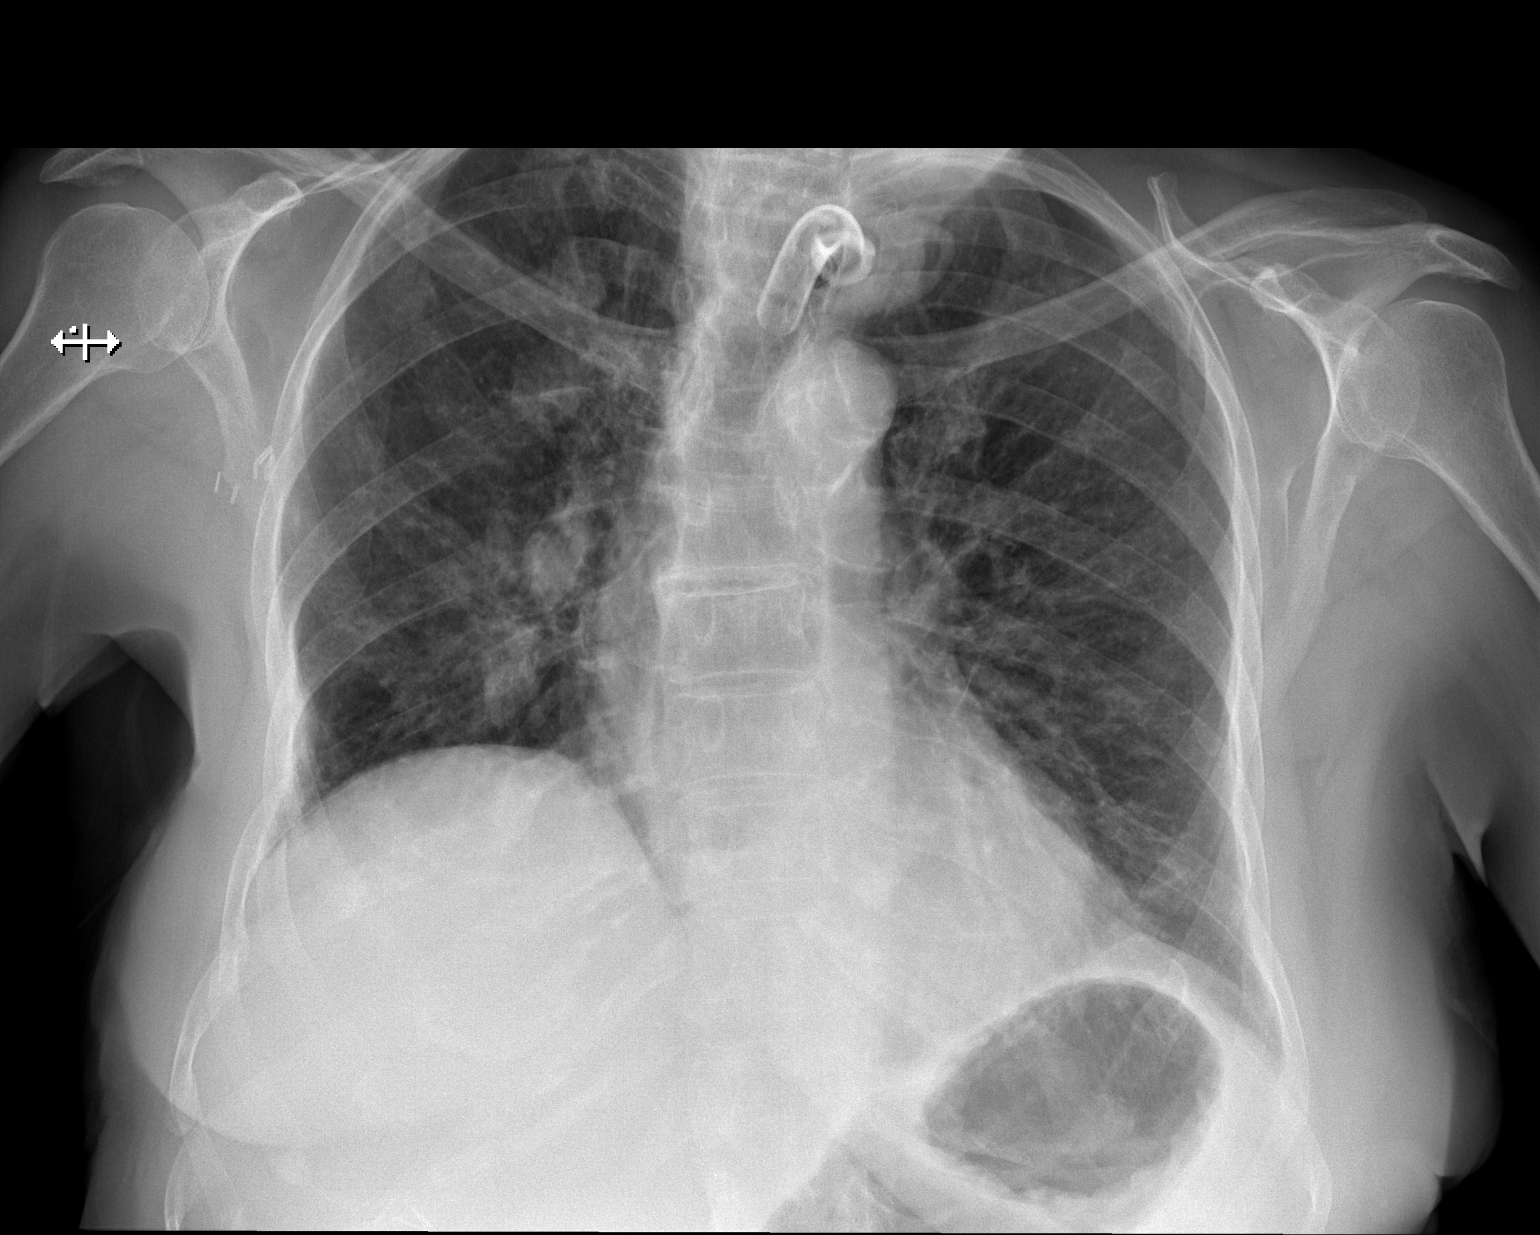
[im 2/2]
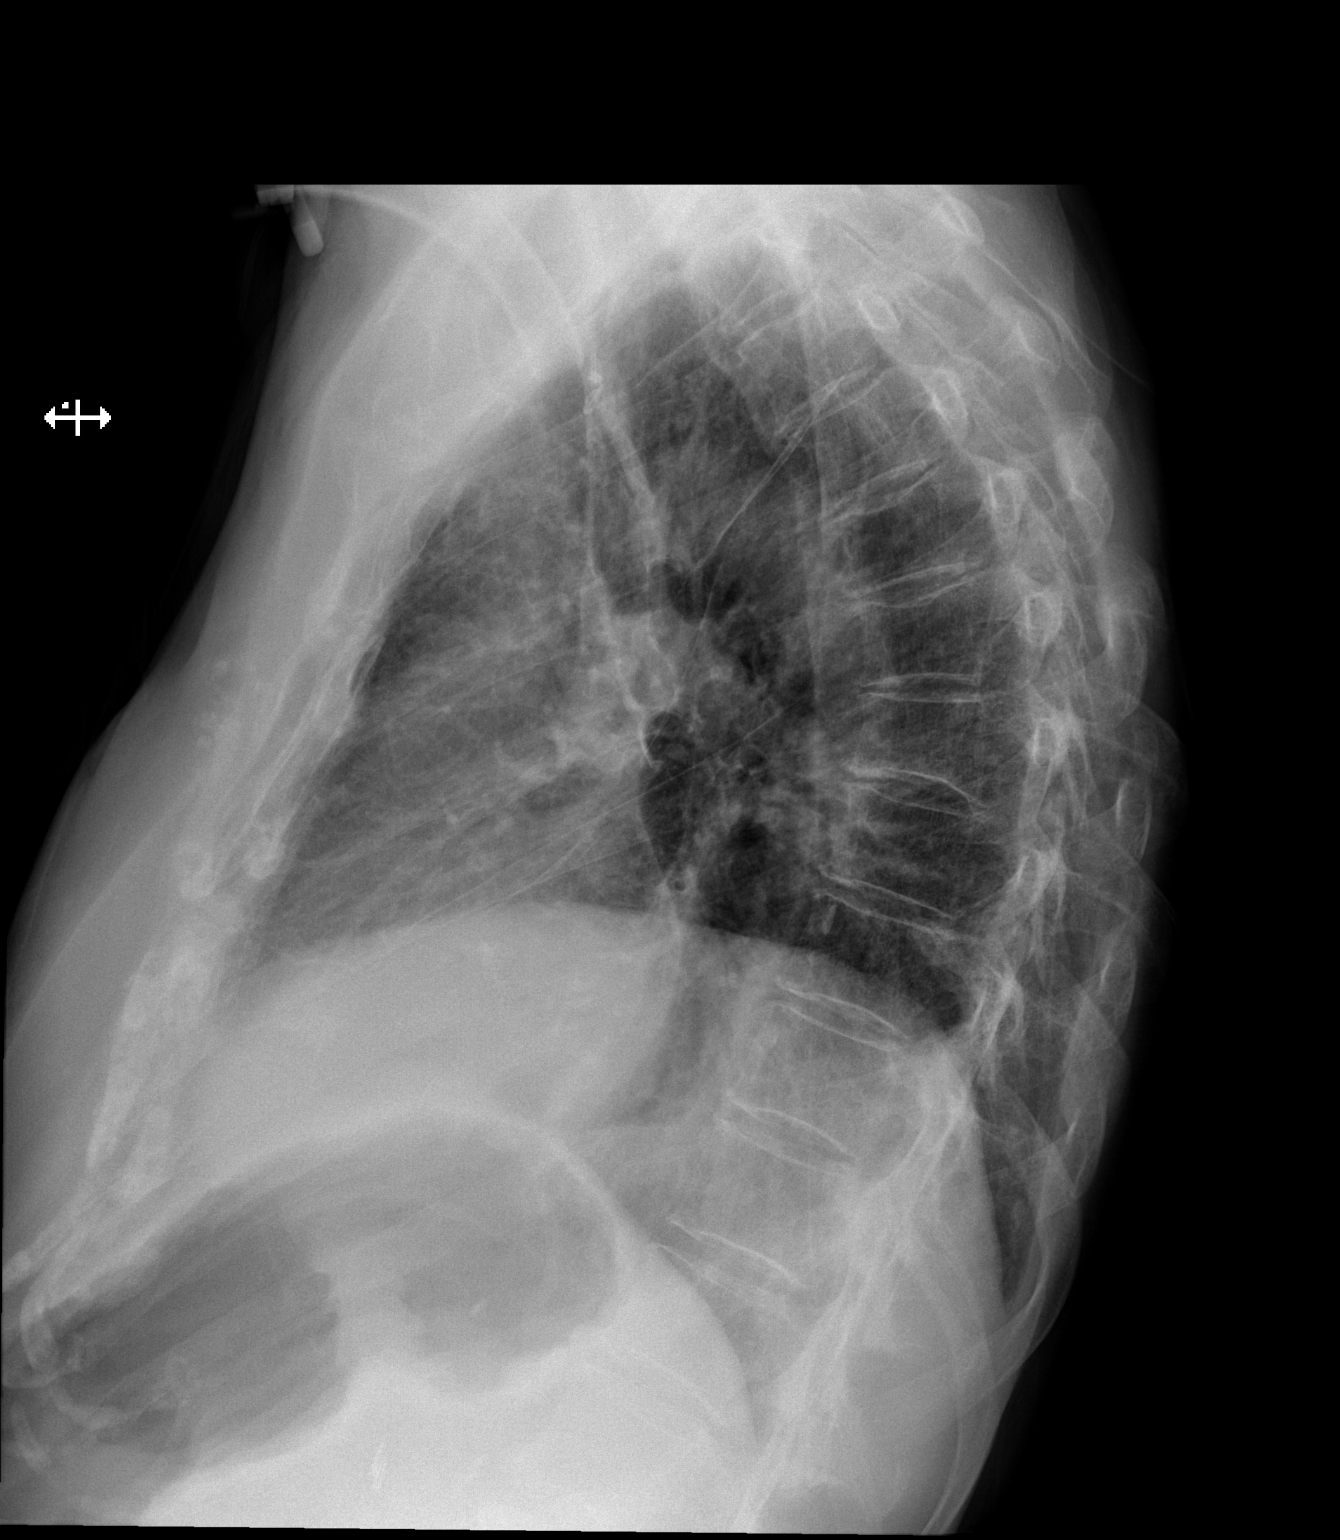

[2 of 2 positions shown; findings below may reference images not displayed]

FINDINGS: Frontal and lateral views of the chest demonstrate tracheostomy tube
within the trachea, tip at level of thoracic inlet. Cardiac
silhouette is unremarkable. No acute airspace disease, effusion, or
pneumothorax. Chronic areas of scarring are seen bilaterally. No
acute bony abnormalities.
IMPRESSION: 1. No acute intrathoracic process.
2. Tracheostomy tube well positioned within the trachea at the
thoracic inlet.

## 2021-08-14 ENCOUNTER — Ambulatory Visit (INDEPENDENT_AMBULATORY_CARE_PROVIDER_SITE_OTHER): Payer: Medicare Other | Admitting: Surgery

## 2021-08-14 ENCOUNTER — Encounter: Payer: Self-pay | Admitting: Surgery

## 2021-08-14 DIAGNOSIS — K9423 Gastrostomy malfunction: Secondary | ICD-10-CM

## 2021-08-14 NOTE — Patient Instructions (Addendum)
Please finish ALL antibiotics.    If you have any concerns or questions, please feel free to call our office. See follow up appointment below.   Gastrostomy Tube Home Guide, Adult A gastrostomy tube, or G-tube, is a tube that is inserted through the abdomen into the stomach. The tube is used to give feedings and medicines when a person cannot eat and drink enough on his or her own or take medicines by mouth. How to care for the insertion site  Supplies needed: Saline solution or clean, warm water and soap. Saline solution is made of salt and water. Cotton swab or gauze. Pre-cut gauze bandage (dressing) and tape, if needed. Instructions Follow these steps daily to clean the insertion site: Wash your hands with soap and water for at least 20 seconds. Remove the dressing (if there is one) that is between the person's skin and the tube. Check the area where the tube enters the skin. Check daily for problems such as: Redness, rash, or irritation. Swelling. Pus-like drainage. Extra skin growth. Moisten the cotton swab or gauze with the saline solution or with a soap-and-water mixture. Gently clean around the insertion site. Remove any drainage or crusted material. When the G-tube is first put in, a normal saline solution or water can be used to clean the skin. After the skin around the tube has healed, mild soap and water may be used. Apply a dressing (if there should be one) between the person's skin and the tube. How to flush a G-tube Flush the G-tube regularly to keep it from clogging. Flush it before and after feedings and as often as told by the health care provider. Supplies needed: Purified or germ-free (sterile) water, warmed. Container with lid for boiling water, if needed. 60 cc G-tube syringe. Instructions Before you begin, decide whether to use sterile water or purified drinking water. Use only sterile water if: The person has a weak disease-fighting (immune) system. The  person has trouble fighting off infections (is immunocompromised). You are unsure about the amount of chemical contaminants in purified or drinking water. Use purified drinking water in all other cases. To purify drinking water by boiling: Boil water for at least 1 minute. Keep lid over water while it boils. Let water cool to room temperature before using. Follow these steps to flush the G-tube: Wash your hands with soap and water for at least 20 seconds. Bring out (draw up) 30 mL of warm water in a syringe. Connect the syringe to the tube. Slowly and gently push the water into the tube. General tips If the tube comes out: Cover the opening with a clean dressing and tape. Get help right away. If there is skin or scar tissue growing where the tube enters the skin: Keep the area clean and dry. Secure the tube with tape so that the tube does not move around too much. If the tube gets clogged: Slowly push warm water into the tube with a large syringe. Do not force the fluid into the tube or push an object into the tube. Get help right away if you cannot unclog the tube. Follow these instructions at home: Feedings Give feedings at room temperature. If feedings are continuous: Do not put more than 4 hours' worth of feedings in the feeding bag. Stop the feedings when you need to give medicine or flush the tube. Be sure to restart the feedings. Make sure the person's head is above his or her stomach (upright position). This will prevent choking and  discomfort. Make sure the person is in the right position during and after feedings. During feedings, have the person in the upright position. After a non-continuous feeding (bolus feeding), have the person stay in the upright position for 1 hour. Cover and place unused feedings in the refrigerator. Replace feeding bags and syringes as told. Good hygiene Make sure the person takes good care of his or her mouth and teeth (oral hygiene), such as by  brushing his or her teeth. Keep the area where the tube enters the skin clean and dry. General instructions Do not pull or put tension on the tube. Before you remove the tube cap or disconnect a syringe, close the tube by using a clamp (clamping) or bending (kinking) the tube. Measure the length of the G-tube every day from the insertion site to the end of the tube. If the person's G-tube has a balloon, check the fluid in the balloon every week. Check the manufacturer's specifications to find the amount of fluid that should be in the balloon. Remove excess air from the G-tube as told. This is called venting. Do not push feedings, medicines, or flushes fast. Use the feeding tube equipment, such as syringes and connectors, only as told by your health care provider. Contact a health care provider if: The person with the tube has constipation or a fever. A large amount of fluid or mucus-like liquid is leaking from the tube. Skin or scar tissue appears to be growing where the tube enters the skin. The length of tube from the insertion site to the G-tube gets longer. Get help right away if: The person with the tube has any of these problems: Severe pain, tenderness, or bloating in the abdomen. Nausea or vomiting. Trouble breathing or shortness of breath. Any of these problems happen in the area where the tube enters the skin: Redness, irritation, swelling, or soreness. Pus-like discharge. A bad smell. The tube is clogged and cannot be flushed. The tube comes out. The tube will need to be put back in within 4 hours. Summary A gastrostomy tube, or G-tube, is a tube that is inserted through the abdomen into the stomach. The tube is used to give feedings and medicines when a person cannot eat and drink enough on his or her own or cannot take medicine by mouth. Check and clean the insertion site daily as told by the person's health care provider. Flush the G-tube regularly to keep it from clogging.  Flush it before and after feedings and as often as told. Keep the area where the tube enters the skin clean and dry. This information is not intended to replace advice given to you by your health care provider. Make sure you discuss any questions you have with your health care provider. Document Revised: 02/03/2020 Document Reviewed: 07/02/2019 Elsevier Patient Education  [place].

## 2021-08-21 ENCOUNTER — Inpatient Hospital Stay: Payer: Medicare Other

## 2021-08-21 ENCOUNTER — Inpatient Hospital Stay: Payer: Medicare Other | Admitting: Oncology

## 2021-08-21 ENCOUNTER — Inpatient Hospital Stay: Payer: Medicare Other | Attending: Oncology

## 2021-08-27 DIAGNOSIS — Z853 Personal history of malignant neoplasm of breast: Secondary | ICD-10-CM | POA: Insufficient documentation

## 2021-08-27 DIAGNOSIS — E119 Type 2 diabetes mellitus without complications: Secondary | ICD-10-CM | POA: Diagnosis not present

## 2021-08-27 DIAGNOSIS — Z4659 Encounter for fitting and adjustment of other gastrointestinal appliance and device: Secondary | ICD-10-CM | POA: Insufficient documentation

## 2021-08-28 ENCOUNTER — Encounter: Payer: Self-pay | Admitting: Emergency Medicine

## 2021-08-28 ENCOUNTER — Emergency Department
Admission: EM | Admit: 2021-08-28 | Discharge: 2021-08-28 | Disposition: A | Discharge status: Home / Self Care | Payer: Medicare Other | Attending: Emergency Medicine | Admitting: Emergency Medicine

## 2021-08-28 ENCOUNTER — Emergency Department: Payer: Medicare Other

## 2021-08-28 DIAGNOSIS — Z431 Encounter for attention to gastrostomy: Secondary | ICD-10-CM

## 2021-08-28 DIAGNOSIS — Z4659 Encounter for fitting and adjustment of other gastrointestinal appliance and device: Secondary | ICD-10-CM | POA: Diagnosis not present

## 2021-08-28 MED ORDER — ZINC OXIDE 40 % EX OINT
TOPICAL_OINTMENT | CUTANEOUS | Status: AC
  Administered 2021-08-28: 1 via TOPICAL
  Filled 2021-08-28: qty 113

## 2021-08-28 MED ORDER — DIATRIZOATE MEGLUMINE & SODIUM 66-10 % PO SOLN
30.0000 mL | Freq: Once | ORAL | Status: AC
  Administered 2021-08-28: 30 mL via ORAL

## 2021-08-28 NOTE — ED Provider Notes (Signed)
[facility] Provider Note    Event Date/Time   First MD Initiated Contact with Patient 08/28/21 [phone]     (approximate)   History   No chief complaint on file.   HPI  Angela David is a 81 y.o. 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  She has no other acute complaints including abdominal pain, chest pain, cough, shortness of breath or any other acute concerns.  There has been no bleeding or purulent drainage or significant pain around the site.  She does not think she caught it against anything.      Physical Exam  Triage Vital Signs: ED Triage Vitals  Enc Vitals Group     BP 08/28/21 0036 (!) 112/50     Pulse Rate 08/28/21 0027 72     Resp 08/28/21 0027 (!) 21     Temp 08/28/21 0027 97.9 F (36.6 C)     Temp Source 08/28/21 0027 Oral     SpO2 08/28/21 0027 94 %     Weight --      Height 08/28/21 0013 '5\' 4"'$  (1.626 m)     Head Circumference --      Peak Flow --      Pain Score --      Pain Loc --      Pain Edu? --      Excl. in [place]? --     Most recent vital signs: Vitals:   08/28/21 0027 08/28/21 0036  BP:  (!) 112/50  Pulse: 72   Resp: (!) 21   Temp: 97.9 F (36.6 C)   SpO2: 94%     General: Awake, no distress.  CV:  Good peripheral perfusion.  Resp:  Normal effort.  Abd:  Slightly distended.  No induration fluctuance purulence or bleeding around PEG tube insertion site. Other:     ED Results / Procedures / Treatments  Labs (all labs ordered are listed, but only abnormal results are displayed) Labs Reviewed - No data to display   EKG    RADIOLOGY  KUB reviewed by  myself shows history of tip around the duodenal bulb but no leakage or evidence of perforation.  I also reviewed radiology interpretation.   PROCEDURES:  Critical Care performed: No  FEEDING TUBE REPLACEMENT  Date/Time: 08/28/2021 3:57 AM  Performed by: Lucrezia Starch, MD Authorized by: Lucrezia Starch, MD  Consent: Verbal consent obtained. Consent given by: patient Patient understanding: patient states understanding of the procedure being performed Patient identity confirmed: verbally with patient Indications: tube dislodged Local anesthesia used: no  Anesthesia: Local anesthesia used: no  Sedation: Patient sedated: no  Tube type: gastrostomy Patient position: supine Procedure type: replacement Endoscope used: no Bulb inflation volume: 10 (ml) Placement/position confirmation: x-ray and contrast Patient tolerance: patient tolerated the procedure well with no immediate complications      MEDICATIONS ORDERED IN ED: Medications  liver oil-zinc oxide (DESITIN) 40 % ointment (has no administration in time range)     IMPRESSION / MDM / ASSESSMENT AND PLAN / ED COURSE  I reviewed the triage vital signs and the nursing notes. Patient's presentation is most consistent with acute, uncomplicated  illness.                               Differential diagnosis includes, but is not limited to acute PEG tube dislodgment without evidence of hematoma or infection.  Patient has no other acute concerns.  PEG tube was replaced per procedure note above.  It appears to be appropriate position without any evidence of false tract or perforation on KUB.  Discharged in stable condition.  Strict return precautions advised and discussed.        FINAL CLINICAL IMPRESSION(S) / ED DIAGNOSES   Final diagnoses:  PEG (percutaneous endoscopic gastrostomy) adjustment/replacement/removal ([place])     Rx / DC Orders   ED Discharge Orders     None        Note:  This document was  prepared using Dragon voice recognition software and may include unintentional dictation errors.   Lucrezia Starch, MD 08/28/21 [phone]

## 2021-08-28 NOTE — ED Triage Notes (Signed)
Pt arrived via [facility] from home in need of feeding tube replacement after hers came out tonight. Pt has tracheostomy with need for ventilator at night time.

## 2021-08-30 ENCOUNTER — Encounter: Payer: Self-pay | Admitting: Physician Assistant

## 2021-08-30 ENCOUNTER — Other Ambulatory Visit: Payer: Self-pay

## 2021-08-30 ENCOUNTER — Ambulatory Visit (INDEPENDENT_AMBULATORY_CARE_PROVIDER_SITE_OTHER): Payer: Medicare Other | Admitting: Physician Assistant

## 2021-08-30 VITALS — BP 108/63 | HR 79 | Temp 98.4°F | Ht 64.0 in | Wt 151.0 lb

## 2021-08-30 DIAGNOSIS — K9423 Gastrostomy malfunction: Secondary | ICD-10-CM | POA: Diagnosis not present

## 2021-08-30 NOTE — Progress Notes (Signed)
[facility] NOTE  08/30/2021  History of Present Illness: Angela David is a 81 y.o. female well known to our service for multiple issues including chronic hypoxia and trach dependence but most recently following for issues with 16 Fr gastrostomy tube placed in December of 2021 with Dr Dahlia Byes for microaspiration and inability to swallow well. She saw him on 06/19 after visits to the ED for the gastrostomy tube falling out.He was able to upsize this to a 18 Fr tube on 06/19 without issue. Unfortunately, it appears she again presented to the ED on 07/03 after her G-tube "popped out." EDP was able to replace this and confirm positioning via KUB and was discharged home. She presents today with continued complaints of "everything pouring out" of her gastrotomy tube. Her husband has been trying to reinforce the dressing to help with this but gotten no improvements. The skin in this area is macerated. No fever, chills. She has been able to tolerate some PO. She is getting very frustrated by the situation.   Past Medical History: Past Medical History:  Diagnosis Date   Breast cancer, right ([place]) 2002   Tx'd with chemotherapy and XRT   Chronic respiratory failure ([place])    s/p tracheostomy in 2012; on ventilatory support at hs   Diabetic peripheral neuropathy ([place]) 08/28/2016   GERD (gastroesophageal reflux disease)    Glaucoma    High cholesterol    History of colon polyps    Osteoarthritis    Personal history of chemotherapy 2002   BREAST CA   Personal history of radiation therapy 2002   BREAST CA   S/P percutaneous endoscopic gastrostomy (PEG) tube placement ([facility])    T2DM (type 2 diabetes mellitus) ([place])    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   Ventral hernia    Vocal cord paralysis 2012     Past Surgical History: Past Surgical History:  Procedure Laterality Date   ABDOMINAL HYSTERECTOMY     BREAST BIOPSY Right 2008   neg   BREAST BIOPSY Right  06/30/2020   "Q" clip-path pending   BREAST EXCISIONAL BIOPSY Right 2002   positive   BREAST LUMPECTOMY Right 2002   chemo and radiation   BREAST LUMPECTOMY WITH RADIOFREQUENCY TAG IDENTIFICATION Right 10/18/2020   Procedure: BREAST LUMPECTOMY WITH RADIOFREQUENCY TAG IDENTIFICATION;  Surgeon: Jules Husbands, MD;  Location: [facility] ORS;  Service: General;  Laterality: Right;   BREAST SURGERY Right 2002   LUMPECTOMY, radiation, chemotherapy   CATARACT EXTRACTION, BILATERAL     CHOLECYSTECTOMY N/A 12/10/2017   Procedure: LAPAROSCOPIC CHOLECYSTECTOMY;  Surgeon: Benjamine [place], DO;  Location: [facility] ORS;  Service: General;  Laterality: N/A;   COLON RESECTION Right 11/06/2018   Procedure: HAND ASSISTED LAPAROSCOPIC COLON RESECTION, RIGHT;  Surgeon: Jules Husbands, MD;  Location: [facility] ORS;  Service: General;  Laterality: Right;   COLONOSCOPY  2009,12/30/2013   COLONOSCOPY WITH PROPOFOL N/A 04/16/2018   Tubulovillous polyp proximal transverse colon, large sessile polyp, proximal transverse colon;  Surgeon: Robert Bellow, MD;  Location: [facility] ENDOSCOPY;  Service: Endoscopy;  Laterality: N/A;   ESOPHAGOGASTRODUODENOSCOPY (EGD) WITH PROPOFOL N/A 10/14/2018   Procedure: ESOPHAGOGASTRODUODENOSCOPY (EGD) WITH PROPOFOL;  Surgeon: Lucilla Lame, MD;  Location: [facility] ENDOSCOPY;  Service: Endoscopy;  Laterality: N/A;   TRACHEAL SURGERY  2013   TRACHEOSTOMY N/A    Dr Kathyrn Sheriff   XI ROBOTIC ASSISTED VENTRAL HERNIA N/A 10/18/2020   Procedure: XI ROBOTIC ASSISTED VENTRAL HERNIA;  Surgeon: Jules Husbands, MD;  Location: [facility] ORS;  Service: General;  Laterality: N/A;    Home Medications: Prior to Admission medications   Medication Sig Start Date End Date Taking? Authorizing Provider  acetaminophen (TYLENOL) 325 MG tablet Place 2 tablets (650 mg total) into feeding tube every 6 (six) hours as needed for fever. 03/06/21   Bradly Bienenstock, NP  acetaminophen (TYLENOL) 650 MG suppository Place 1 suppository (650 mg total)  rectally every 4 (four) hours as needed for fever. 03/06/21   Bradly Bienenstock, NP  Chlorhexidine Gluconate Cloth 2 % PADS Apply 6 each topically daily at 6 (six) AM. 03/07/21   Bradly Bienenstock, NP  chlorhexidine gluconate, MEDLINE KIT, (PERIDEX) 0.12 % solution 15 mLs by Mouth Rinse route 2 (two) times daily. 03/06/21   Bradly Bienenstock, NP  conjugated estrogens (PREMARIN) vaginal cream Apply one pea-sized amount around the opening of the urethra daily for 2 weeks, then 3 times weekly moving forward. 07/04/21   Vaillancourt, Aldona Bar, PA-C  Continuous Blood Gluc Sensor (FREESTYLE LIBRE 2 SENSOR) MISC 1 Device by Does not apply route daily. 07/06/21   Bradly Bienenstock, NP  diphenhydrAMINE (BENADRYL) 25 mg capsule Take 1 capsule (25 mg total) by mouth every 6 (six) hours as needed for allergies or itching. 03/06/21   Bradly Bienenstock, NP  docusate (COLACE) 50 MG/5ML liquid Place 10 mLs (100 mg total) into feeding tube daily as needed for mild constipation. Patient taking differently: Place 100 mg into feeding tube every 12 (twelve) hours as needed for mild constipation or moderate constipation. 03/06/21   Bradly Bienenstock, NP  escitalopram (LEXAPRO) 10 MG tablet Place 3 tablets (30 mg total) into feeding tube daily. 03/07/21   Bradly Bienenstock, NP  gabapentin (NEURONTIN) 300 MG capsule Place 300 mg into feeding tube every 8 (eight) hours.    [provider]  hydrOXYzine (ATARAX) 25 MG tablet Place 25 mg into feeding tube 2 (two) times daily.    [provider]  insulin glargine (LANTUS) 100 UNIT/ML Solostar Pen Inject 10 Units into the skin daily. 07/06/21   Darel Hong D, NP  Insulin Pen Needle (PEN NEEDLES 3/16") 31G X 5 MM MISC 1 Container by Does not apply route daily. 07/06/21   Darel Hong D, NP  ipratropium-albuterol (DUONEB) 0.5-2.5 (3) MG/3ML SOLN Take 3 mLs by nebulization every 2 (two) hours as needed. 03/06/21   Bradly Bienenstock, NP  loratadine (CLARITIN) 10 MG tablet Place  1 tablet (10 mg total) into feeding tube daily. 03/07/21   Bradly Bienenstock, NP  metoprolol tartrate (LOPRESSOR) 25 MG tablet Place 1 tablet (25 mg total) into feeding tube daily. Patient taking differently: Place 12.5 mg into feeding tube 2 (two) times daily. 03/07/21   Bradly Bienenstock, NP  Mouthwashes (MOUTH RINSE) LIQD solution 15 mLs by Mouth Rinse route every 2 (two) hours. 03/06/21   Bradly Bienenstock, NP  Multiple Vitamin (MULTIVITAMIN) LIQD Place 15 mLs into feeding tube daily. 03/07/21   Bradly Bienenstock, NP  Nutritional Supplements (FEEDING SUPPLEMENT, KATE FARMS STANDARD 1.4,) LIQD liquid Place 1,440 mLs into feeding tube daily. 03/07/21   Bradly Bienenstock, NP  pantoprazole sodium (PROTONIX) 40 mg Place 40 mg into feeding tube daily. 03/06/21   Bradly Bienenstock, NP  pravastatin (PRAVACHOL) 20 MG tablet Place 1 tablet (20 mg total) into feeding tube daily at 6 PM. 03/06/21   Bradly Bienenstock, NP  Propylene Glycol (SYSTANE BALANCE) 0.6 % SOLN Place 1 drop into both eyes 2 (  two) times daily.    [provider]  psyllium (HYDROCIL/METAMUCIL) 95 % PACK Take 1 packet by mouth daily.    [provider]  sodium chloride HYPERTONIC 3 % nebulizer solution Take 4 mLs by nebulization 2 (two) times daily. 07/05/21   Bradly Bienenstock, NP  Water For Irrigation, Sterile (FREE WATER) SOLN Place 50 mLs into feeding tube every 4 (four) hours. 03/06/21   Bradly Bienenstock, NP    Allergies: Allergies  Allergen Reactions   Shellfish Allergy Shortness Of Breath    respitatory distress    Sodium Sulfite Shortness Of Breath    Severe Congestion that creates inability to breath   Sulfa Antibiotics Shortness Of Breath and Other (See Comments)    respitatory distress   Codeine Other (See Comments)    Per patient "it kept her up all night"   Glucerna [Alimentum] Itching   Wound Dressing Adhesive Rash    Review of Systems: Review of Systems  Constitutional:  Negative for chills and fever.   Respiratory:  Negative for cough and shortness of breath.   Cardiovascular:  Negative for chest pain and palpitations.  Gastrointestinal:  Negative for abdominal pain, nausea and vomiting.       + Gastrostomy tube issues  All other systems reviewed and are negative.   Physical Exam There were no vitals taken for this visit.  Physical Exam Vitals and nursing note reviewed. Exam conducted with a chaperone present.  Constitutional:      General: She is not in acute distress.    Appearance: Normal appearance. She is not ill-appearing.  Eyes:     General: No scleral icterus.    Conjunctiva/sclera: Conjunctivae normal.  Neck:     Trachea: Tracheostomy present.     Comments: Tracheostomy present Cardiovascular:     Rate and Rhythm: Normal rate.     Pulses: Normal pulses.  Pulmonary:     Effort: Pulmonary effort is normal. No respiratory distress.  Abdominal:     General: A surgical scar is present. There is no distension.     Tenderness: There is no abdominal tenderness. There is no guarding or rebound.     Comments: Abdomen is soft, no significant tenderness, non-distended, no rebound/guarding. She has known gastrostomy tube in the epigastric region, there is significant drainage of enteric feedings around this site. The skin around it is macerated from moisture. The balloon is intact and fills without issues. The ring is fairly loose although I suspect moisture is playing a role in this.   Genitourinary:    Comments: Deferred Musculoskeletal:     Right lower leg: No edema.     Left lower leg: No edema.  Skin:    General: Skin is warm and dry.     Findings: Erythema present.  Neurological:     General: No focal deficit present.     Mental Status: She is alert and oriented to person, place, and time. Mental status is at baseline.  Psychiatric:        Mood and Affect: Mood normal.        Behavior: Behavior normal.     Labs/Imaging:  KUB (08/28/2021) personally reviewed  which shows adequate position of gastrostomy tube with contrast in stomach and small bowel, and radiologist report reviewed below:  IMPRESSION: Percutaneous enteric tube tip appears to be in the duodenal bulb. Contrast injection opacifies the duodenum and proximal jejunum with no adverse features.   Assessment and Plan: This is a 81 y.o. female  with history of gastrostomy tube secondary to malnutrition, microaspiration, and difficulty swallowing who presents for continued issues with leaking around the gastrostomy tube.    - After examination, the balloon fills well with 10 ccs of NS and is snug against the stomach well. The disc is able to cinched down but slides fairly freely given the amount of moisture. The skin around the drain itself is not overly loose but I suspect this is a multifactorial issue involving the whole in the skin being too large for the tube and the disc not cinching well against the skin. Unfortunately, I do not have any new gastrostomy tubes available at this time and I am not sure we have a large tube available in the hospital. In an effort to temporize things and stop drainage, I offer to place a suture around the drain site/skin to help minimize the space available to drain around this. I also consider suturing the disc down as well to help prevent this from sliding. Patient, and her husband, agreed with plan. Will document separately.    - Under sterile technique, I instilled 10 ccs of 1% lidocaine circumferentially around her gastrostomy tube. Once adequate anesthetic was obtained, I place a single interrupted 1-0 prolene suture superior to the gastrostomy tube this was tied down and then secured around the gastrostomy tube itself. There was another small gap in the skin with drainage after placement of the first suture and I was able to close this with another simple interrupted 1-0 prolene. A IV dressing was placed and the disc was cinched without issues. There was no  appreciable leakage at the end of the procedure. Dressing was applied. The patient tolerated this well without complications all sharps were accounted for and disposed of properly.    - She will follow up on as needed basis for now. Instructed to call with questions/concerns moving forward.   Face-to-face time spent with the patient and care providers was 60 minutes, with more than 50% of the time spent counseling, educating, and coordinating care of the patient.     Edison Simon, PA-C [facility] 08/30/2021, 2:18 PM M-F: 7am - 4pm

## 2021-08-30 NOTE — Patient Instructions (Signed)
Please call the office with any questions or concerns.

## 2021-09-04 ENCOUNTER — Ambulatory Visit: Payer: Medicare Other | Admitting: Surgery

## 2021-09-07 ENCOUNTER — Telehealth: Payer: Self-pay | Admitting: Internal Medicine

## 2021-09-07 NOTE — Telephone Encounter (Signed)
ATC patient to gather additional information. Unable to leave vm due to mailbox being full.  Patient last seen 05/2020. According to our records, trilogy was d/c'd 08/2019.  I have spoken to [facility] with [employer]. Melissa stated that patient had short term oxygen. Per medicare guidelines patient will need to be re qualified prior to 09/17/2021. Lenna Sciara is aware that first available in [place] office is 10/2021. Melissa will let oxygen team know. Please schedule OV when patient returns call.

## 2021-09-08 ENCOUNTER — Emergency Department: Payer: Medicare Other

## 2021-09-08 ENCOUNTER — Emergency Department
Admission: EM | Admit: 2021-09-08 | Discharge: 2021-09-09 | Disposition: A | Discharge status: Home / Self Care | Payer: Medicare Other | Attending: Emergency Medicine | Admitting: Emergency Medicine

## 2021-09-08 DIAGNOSIS — Z853 Personal history of malignant neoplasm of breast: Secondary | ICD-10-CM | POA: Diagnosis not present

## 2021-09-08 DIAGNOSIS — E1140 Type 2 diabetes mellitus with diabetic neuropathy, unspecified: Secondary | ICD-10-CM | POA: Insufficient documentation

## 2021-09-08 DIAGNOSIS — K9423 Gastrostomy malfunction: Secondary | ICD-10-CM | POA: Diagnosis present

## 2021-09-08 MED ORDER — DIATRIZOATE MEGLUMINE & SODIUM 66-10 % PO SOLN
30.0000 mL | Freq: Once | ORAL | Status: AC
  Administered 2021-09-09: 30 mL

## 2021-09-08 NOTE — ED Triage Notes (Signed)
Pt states that their PEG tube came out around 630 tonight. Pt has it with them in a ziplock bag.

## 2021-09-08 NOTE — Telephone Encounter (Signed)
Spoke to patient's spouse, [name](DPR) and relayed below message. He is willing to travel to [facility] for appt, as re cert needs to be prior to 09/24/2021. Appt scheduled 09/15/2021 at 3:00. Address provided.  Nothing further needed.

## 2021-09-09 NOTE — ED Provider Notes (Signed)
[facility] Provider Note    Event Date/Time   First MD Initiated Contact with Patient 09/08/21 2129     (approximate)   History   Post-op Problem   HPI  Angela David is a 81 y.o. female presenting to the emergency department for treatment and evaluation after PEG tube came out.  This is happened multiple times.  Last time it was reinserted the surgical PA put some sutures around the to secure it but the sutures must have worked loose and the tube has dislodged.  Husband states that it has had quite a bit of leaking around it lately as well.  No indication of infection.  Past Medical History:  Diagnosis Date   Breast cancer, right ([place]) 2002   Tx'd with chemotherapy and XRT   Chronic respiratory failure ([place])    s/p tracheostomy in 2012; on ventilatory support at hs   Diabetic peripheral neuropathy ([place]) 08/28/2016   GERD (gastroesophageal reflux disease)    Glaucoma    High cholesterol    History of colon polyps    Osteoarthritis    Personal history of chemotherapy 2002   BREAST CA   Personal history of radiation therapy 2002   BREAST CA   S/P percutaneous endoscopic gastrostomy (PEG) tube placement (HCC)    T2DM (type 2 diabetes mellitus) ([place])    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      Excl. in [place]?     Most recent vital signs: Vitals:   09/08/21 2032  BP: (!) 118/56  Pulse: 80  Resp: 20  Temp: 98.5 F (36.9 C)  SpO2: 95%    General: Awake, no distress.  CV:  Good peripheral perfusion.  Resp:  Normal effort.  Abd:  No distention.  Other:  Site of PEG tube  without surrounding erythema.   ED Results / Procedures / Treatments   Labs (all labs ordered are listed, but only abnormal results are displayed) Labs Reviewed - No data to display   EKG   RADIOLOGY  Confirmation of placement of PEG via contrast injection on abdominal image.  I have independently reviewed and interpreted imaging as well as reviewed report from radiology.  PROCEDURES:  Critical Care performed: No  Procedures   MEDICATIONS ORDERED IN ED:  Medications  diatrizoate meglumine-sodium (GASTROGRAFIN) 66-10 % solution 30 mL (has no administration in time range)     IMPRESSION / MDM / ASSESSMENT AND PLAN / ED COURSE   I reviewed the triage vital signs and the nursing notes.  Differential diagnosis includes, but is not limited to: PEG dislodged, infection related to PEG tube  Patient's presentation is most consistent with acute, uncomplicated illness.  81 year old female presenting to the emergency department after PEG tube spontaneously dislodged.  It had been secured by suture material  by surgical PA but sutures have broken through the skin.  Today, PEG tube was replaced with 16 French tube instead of 18 that was previously in.  Placement was confirmed with contrast injection and x-ray.  Patient encouraged to follow-up with Dr. Dahlia Byes as this happens quite frequently.      FINAL CLINICAL IMPRESSION(S) / ED DIAGNOSES   Final diagnoses:  PEG tube malfunction ([place])     Rx / DC Orders   ED Discharge Orders     None        Note:  This document was prepared using Dragon voice recognition software and may include unintentional dictation errors.   Victorino Dike, FNP 09/09/21 1805    Blake Divine, MD 09/12/21 1558

## 2021-09-15 ENCOUNTER — Telehealth: Payer: Self-pay | Admitting: Primary Care

## 2021-09-15 ENCOUNTER — Ambulatory Visit (INDEPENDENT_AMBULATORY_CARE_PROVIDER_SITE_OTHER): Payer: Medicare Other | Admitting: Primary Care

## 2021-09-15 ENCOUNTER — Encounter: Payer: Self-pay | Admitting: Primary Care

## 2021-09-15 VITALS — BP 115/58 | HR 74 | Temp 98.9°F | Ht 64.0 in | Wt 154.0 lb

## 2021-09-15 DIAGNOSIS — Z930 Tracheostomy status: Secondary | ICD-10-CM | POA: Diagnosis not present

## 2021-09-15 DIAGNOSIS — J9612 Chronic respiratory failure with hypercapnia: Secondary | ICD-10-CM

## 2021-09-15 DIAGNOSIS — T17908S Unspecified foreign body in respiratory tract, part unspecified causing other injury, sequela: Secondary | ICD-10-CM | POA: Diagnosis not present

## 2021-09-15 DIAGNOSIS — J9611 Chronic respiratory failure with hypoxia: Secondary | ICD-10-CM

## 2021-09-15 NOTE — Patient Instructions (Addendum)
Recommendations: - Continue Trilogy ventilator every night - We will check overnight oximetry test off of oxygen to determine whether or not we can discontinue oxygen and humidification (call a couple days after testing for results) - Continue to use hypertonic saline bullets 2-3 times a day as needed for congestion - Use ipratropium-albuterol (DuoNeb) every 4-6 hours as needed for breakthrough shortness of breath or wheezing  Orders: - Overnight oximetry on Trilogy ventilator re: chronic hypoxic respiratory failure (ordered)  Referral: - [facility] with Sebastian/ Marni Griffon  (ordered) - [facility] re: tracheostomy/ vocal cord dysfunction (ordered)  Follow-up: - 3 months with Dr. Mortimer Fries in [place]

## 2021-09-15 NOTE — Telephone Encounter (Signed)
Can we refer patient to tach clinic to see Marni Griffon

## 2021-09-15 NOTE — Progress Notes (Unsigned)
'@Patient'  ID: Angela David, female    DOB: August 03, 1940, 81 y.o.   MRN: [id number]  No chief complaint on file.   Referring provider: No ref. 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  Past medical history significant for chronic diastolic heart failure, A-fib, hypertension, chronic respiratory failure, aspiration pneumonia, benign neoplasm of trachea, history of tracheostomy, chronic kidney disease stage III, breast cancer, hyperlipidemia.  Patient of Dr. Mortimer Fries, last seen on 06/01/21.    [place] ADMISSION 01/22: Pt with chronic tracheostomy presented to ER with generalized weakness found to be hypercapnic requiring mechanical ventilation.  Evidence of pneumonia, MRSA. 01/22: CT Head revealed no CT evidence for acute intracranial abnormality. Atrophy and presumed chronic small vessel ischemic change of the white matter.  Sinusitis noted. 01/22: CTA Chest revealed no pulmonary embolus. Dilated fluid-filled esophagus from the thoracic inlet to the gastroesophageal junction, may be reflux or dysmotility. Achalasia also considered. Mild dependent opacities in both lower lobes, likely atelectasis. Given esophageal dilatation, aspiration is also considered. Subacute right rib fractures. Some interval incomplete fracture healing since October 2020 injury. Stable right upper lobe scarring and benign pulmonary nodules. Aortic Atherosclerosis (ICD10-I70.0). 01/23: Pt admitted to ICU  03/22/19- Patient is clincally improved, of mechanical ventilation.  Starting PT/OT with plan to transition to hospitalist service. 1/28 returned to ICU due to episode of loss of consciousness on MedSurg  floor.  This was due to recurrent issues with hypercapnia it was determined patient would need at the very least nighttime ventilation.  Patient placed on mechanical ventilation. 1/29 off vent during day, eating by mouth, speech evaluated.  Nighttime ventilation. 1/30 remains off vent during day, eating by mouth.  Continue nighttime mechanical ventilation.  Referred to DME for Trilogy home ventilator. 2/01 husband and niece to be trained  on Trilogy vent. 2/02 unable to use Trilogy vent last night due to alarms going off.  DME company needs to review settings 2/03 successful overnight use of Trilogy ventilator, stable and ready for discharge   Previous [facility] pulmonary encounter: 06/01/20- Dr. Mortimer Fries  Overall patient has been getting better with her strength Patient has chronic trach No acute issues at this time Patient is chronic vent dependent at nighttime She has a cuffed nonfenestrated trach Patient vocalizes by using her finger  No signs or symptoms of infection Patient still needs trilogy ventilator for survival  Primary nutrition no support is via PEG tube  No exacerbation at this time No evidence of heart failure at this time No evidence or signs of infection at this time No respiratory distress No fevers, chills, nausea, vomiting, diarrhea No evidence of lower extremity edema No evidence hemoptysis    09/15/2021- interim hx  Patient presents today for overdue follow-up for respiratory failure vent dependent, chronic trach, chronic peg, hx MRSA pneumonia. Patient needs requalification for oxygen. DME company [employer].  Oxygen levels at home run between 94-97% RA during daytime. She wears oxygen with ventilator at night. She has chronic congestion, no purulent mucus. She follows with Dr. Tami Ribas  They would like to see about going to [facility]NT regarding chronic trach, hoping she may ben able to get fenestrated cuff trach to allow her to communicate better. They are doing trach  changes at home. She currently has #  6 shiley. Chronic congestion, cough without purulent mucus. No acute respiratory symptoms. Denies shortness of breath, chest tightness or wheezing.   Chronic respiratory failure - From COPD, MRSA pneumonia and vocal cord dysfunction s/p trach - ABGs 06/26/21 pCO2 62 - Continue 2L supplemental oxygen at night and trilogy ventilator, needs for survival to help reduce hospital admission and recurrent COPD exacerbations   Vocal cord dysfunction - Permament trach, following with ENT   Chronic aspiration - High risk for aspiration, patient has PEG TUBE   Allergies  Allergen Reactions   Shellfish Allergy Shortness Of Breath    respitatory distress    Sodium Sulfite Shortness Of Breath    Severe Congestion that creates inability to breath   Sulfa Antibiotics Shortness Of Breath and Other (See Comments)    respitatory distress   Codeine Other (See Comments)    Per patient "it kept her up all night"   Glucerna [Alimentum] Itching   Wound Dressing Adhesive Rash    Immunization History  Administered Date(s) Administered   PFIZER(Purple Top)SARS-COV-2 Vaccination 07/22/2019, 08/12/2019   Pneumococcal Polysaccharide-23 01/21/2020    Past Medical History:  Diagnosis Date   Breast cancer, right ([place]) 2002   Tx'd with chemotherapy and XRT   Chronic respiratory failure ([place])    s/p tracheostomy in 2012; on ventilatory support at hs   Diabetic peripheral neuropathy ([place]) 08/28/2016   GERD (gastroesophageal reflux disease)    Glaucoma    High cholesterol    History of colon polyps    Osteoarthritis    Personal history of chemotherapy 2002   BREAST CA   Personal history of radiation therapy 2002   BREAST CA   S/P percutaneous endoscopic gastrostomy (PEG) tube placement (HCC)    T2DM (type 2 diabetes mellitus) ([place])    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     acetaminophen (TYLENOL) 650 MG suppository Place 1 suppository (650 mg total) rectally every 4 (four) hours as needed for fever. 12 suppository 0   Chlorhexidine Gluconate Cloth 2 % PADS Apply 6 each topically daily at 6 (six) AM.     chlorhexidine gluconate, MEDLINE KIT, (PERIDEX) 0.12 % solution 15 mLs by Mouth Rinse route 2 (two) times daily. 120 mL 0   conjugated estrogens (PREMARIN) vaginal cream Apply one pea-sized amount around the opening of the urethra daily for 2 weeks, then 3 times weekly moving forward. 30 g 4   Continuous Blood Gluc Sensor (FREESTYLE LIBRE 2 SENSOR) MISC 1 Device by Does not apply route daily. 30 each 1   diphenhydrAMINE (BENADRYL) 25 mg capsule Take 1 capsule (25 mg total) by mouth every 6 (six) hours as needed for allergies or itching. 30 capsule 0   docusate (COLACE) 50 MG/5ML liquid Place 10 mLs (100 mg total) into feeding tube daily as needed for mild constipation. (Patient taking differently: Place 100 mg into feeding tube every 12 (twelve) hours as needed for mild constipation or moderate constipation.) 100 mL 0   escitalopram (LEXAPRO) 10 MG tablet Place 3 tablets (30 mg total) into feeding tube daily.  gabapentin (NEURONTIN) 300 MG capsule Place 300 mg into feeding tube every 8 (eight) hours.     hydrOXYzine (ATARAX) 25 MG tablet Place 25 mg into feeding tube 2 (two) times daily.     insulin glargine (LANTUS) 100 UNIT/ML Solostar Pen Inject 10 Units into the skin daily. 15 mL 11   Insulin Pen Needle (PEN NEEDLES 3/16") 31G X 5 MM MISC 1 Container by Does not apply route daily. 30 each 1   ipratropium-albuterol (DUONEB) 0.5-2.5 (3) MG/3ML SOLN Take 3 mLs by nebulization  every 2 (two) hours as needed. 360 mL    loratadine (CLARITIN) 10 MG tablet Place 1 tablet (10 mg total) into feeding tube daily.     metoprolol tartrate (LOPRESSOR) 25 MG tablet Place 1 tablet (25 mg total) into feeding tube daily. (Patient taking differently: Place 12.5 mg into feeding tube 2 (two) times daily.)     Mouthwashes (MOUTH RINSE) LIQD solution 15 mLs by Mouth Rinse route every 2 (two) hours.  0   Multiple Vitamin (MULTIVITAMIN) LIQD Place 15 mLs into feeding tube daily.     Nutritional Supplements (FEEDING SUPPLEMENT, KATE FARMS STANDARD 1.4,) LIQD liquid Place 1,440 mLs into feeding tube daily.     pantoprazole sodium (PROTONIX) 40 mg Place 40 mg into feeding tube daily.     pravastatin (PRAVACHOL) 20 MG tablet Place 1 tablet (20 mg total) into feeding tube daily at 6 PM.     Propylene Glycol (SYSTANE BALANCE) 0.6 % SOLN Place 1 drop into both eyes 2 (two) times daily.     psyllium (HYDROCIL/METAMUCIL) 95 % PACK Take 1 packet by mouth daily.     sodium chloride HYPERTONIC 3 % nebulizer solution Take 4 mLs by nebulization 2 (two) times daily. 750 mL 12   Water For Irrigation, Sterile (FREE WATER) SOLN Place 50 mLs into feeding tube every 4 (four) hours.     No facility-administered medications prior to visit.   Review of Systems  Review of Systems  Constitutional: Negative.   HENT:  Positive for congestion.   Respiratory:  Positive for cough. Negative for chest tightness, shortness of breath and wheezing.   Cardiovascular: Negative.      Physical Exam  There were no vitals taken for this visit. Physical Exam Constitutional:      Appearance: Normal appearance.  HENT:     Head: Normocephalic and atraumatic.  Cardiovascular:     Rate and Rhythm: Normal rate and regular rhythm.     Heart sounds: Murmur heard.  Pulmonary:     Effort: Pulmonary effort is normal.     Breath sounds: Normal breath sounds.     Comments: CTA; # 6 shiley trach Musculoskeletal:         General: Normal range of motion.     Comments: WC bound  Skin:    General: Skin is warm and dry.  Neurological:     General: No focal deficit present.     Mental Status: She is alert and oriented to person, place, and time. Mental status is at baseline.  Psychiatric:        Mood and Affect: Mood normal.        Behavior: Behavior normal.        Thought Content: Thought content normal.        Judgment: Judgment normal.      Lab Results:  CBC    Component Value Date/Time   WBC 6.7 07/06/2021 0430   RBC 4.07 07/06/2021 0430  HGB 11.1 (L) 07/06/2021 0430   HGB 14.4 12/21/2015 1040   HCT 36.1 07/06/2021 0430   HCT 42.4 12/21/2015 1040   PLT 188 07/06/2021 0430   PLT 241 12/21/2015 1040   MCV 88.7 07/06/2021 0430   MCV 87 12/21/2015 1040   MCV 82 12/17/2011 0650   MCH 27.3 07/06/2021 0430   MCHC 30.7 07/06/2021 0430   RDW 13.7 07/06/2021 0430   RDW 12.5 12/21/2015 1040   RDW 13.3 12/17/2011 0650   LYMPHSABS 1.3 07/05/2021 0613   LYMPHSABS 1.1 12/21/2015 1040   LYMPHSABS 0.9 (L) 12/17/2011 0650   MONOABS 0.5 07/05/2021 0613   MONOABS 0.1 (L) 12/17/2011 0650   EOSABS 0.2 07/05/2021 0613   EOSABS 0.1 12/21/2015 1040   EOSABS 0.0 12/17/2011 0650   BASOSABS 0.0 07/05/2021 0613   BASOSABS 0.0 12/21/2015 1040   BASOSABS 0.0 12/17/2011 0650    BMET    Component Value Date/Time   NA 136 07/06/2021 0430   NA 143 08/28/2016 1132   NA 139 12/17/2011 0650   K 4.4 07/06/2021 0430   K 4.1 12/17/2011 0650   CL 100 07/06/2021 0430   CL 106 12/17/2011 0650   CO2 32 07/06/2021 0430   CO2 24 12/17/2011 0650   GLUCOSE 189 (H) 07/06/2021 0430   GLUCOSE 209 (H) 12/17/2011 0650   BUN 24 (H) 07/06/2021 0430   BUN 16 08/28/2016 1132   BUN 15 12/17/2011 0650   CREATININE 0.69 07/06/2021 0430   CREATININE 0.88 12/17/2011 0650   CALCIUM 9.7 07/06/2021 0430   CALCIUM 8.8 12/17/2011 0650   GFRNONAA >60 07/06/2021 0430   GFRNONAA >60 12/17/2011 0650   GFRAA 56 (L) 05/18/2019 1325    GFRAA >60 12/17/2011 0650    BNP    Component Value Date/Time   BNP 181.2 (H) 01/15/2020 1355    ProBNP No results found for: "PROBNP"  Imaging: DG Abdomen 1 View  Result Date: 09/09/2021 CLINICAL DATA:  Peg placement. EXAM: ABDOMEN - 1 VIEW COMPARISON:  08/28/2021 FINDINGS: Portable supine view of the abdomen obtained after the installation of 30 cc gastro through indwelling gastrostomy tube. Gastrostomy tube is in the stomach. Contrast opacifies the tubing, stomach and proximal duodenum. There is no evidence of extravasation or leak. IMPRESSION: Gastrostomy tube in the stomach without evidence of extravasation or leak. Electronically Signed   By: Keith Rake M.D.   On: 09/09/2021 00:21   DG Abdomen 1 View  Result Date: 08/28/2021 CLINICAL DATA:  81 year old female with PEG tube placement. EXAM: ABDOMEN - 1 VIEW COMPARISON:  CT Abdomen and Pelvis 08/09/2021 and earlier. FINDINGS: Portable AP supine view at at 0350 hours. Percutaneous enteric tube was injected with contrast. The tip of the tube appears to be in the duodenal bulb. Contrast outlines the duodenum and proximal jejunum. No gastric contrast identified. No contrast extravasation identified. Chronic elevation of the right hemidiaphragm again noted. Stable cholecystectomy clips. Nonobstructed visible bowel gas pattern. Left lung base appears grossly negative. No acute osseous abnormality identified. IMPRESSION: Percutaneous enteric tube tip appears to be in the duodenal bulb. Contrast injection opacifies the duodenum and proximal jejunum with no adverse features. Electronically Signed   By: Genevie Ann M.D.   On: 08/28/2021 04:11     Assessment & Plan:   No problem-specific Assessment & Plan notes found for this encounter.     Martyn Ehrich, NP 09/15/2021

## 2021-09-18 NOTE — Assessment & Plan Note (Signed)
-   High risk for aspiration, patient has PEG TUBE

## 2021-09-18 NOTE — Assessment & Plan Note (Signed)
From COPD, MRSA pneumonia and vocal cord dysfunction s/p trach - ABGs 06/26/21 pCO2 62 - Continue 2L supplemental oxygen at night and trilogy ventilator, needs for survival to help reduce hospital admission and recurrent COPD exacerbations  - Checking ONO on Trilogy to see about discontinuing nocturnal oxygen

## 2021-09-18 NOTE — Assessment & Plan Note (Signed)
-   Permament trach secondary to VCD, following with ENT. Would like second opinion from [facility] about getting fenestrated cuff to help with communication. Referring to [facility] trach center as well.

## 2021-09-19 NOTE — Telephone Encounter (Signed)
I placed referral for trach clinic.

## 2021-09-19 NOTE — Telephone Encounter (Signed)
Need a new order for trach clinic

## 2021-10-02 ENCOUNTER — Telehealth: Payer: Self-pay | Admitting: Primary Care

## 2021-10-04 NOTE — Telephone Encounter (Signed)
Called and spoke with patient's husband Angela David. He stated that the ONO box has a QR code on it for him to scan for more information but he does not have a smartphone. He has tried calling [employer] several times but has not been able to get in contact with anyone. I advised him that I would reach out to [employer] for him and have someone to give him a call. He verbalized understanding.   Called and spoke with [name] at [facility]. She stated that she would have someone from the RT department to give me a call back since she was not familiar with the QR code.   Will leave this encounter open for follow up.

## 2021-10-13 ENCOUNTER — Other Ambulatory Visit: Payer: Self-pay

## 2021-10-13 ENCOUNTER — Emergency Department
Admission: EM | Admit: 2021-10-13 | Discharge: 2021-10-13 | Disposition: A | Discharge status: Home / Self Care | Payer: Medicare Other | Attending: Emergency Medicine | Admitting: Emergency Medicine

## 2021-10-13 ENCOUNTER — Encounter: Payer: Self-pay | Admitting: Emergency Medicine

## 2021-10-13 ENCOUNTER — Emergency Department: Payer: Medicare Other

## 2021-10-13 DIAGNOSIS — K9423 Gastrostomy malfunction: Secondary | ICD-10-CM | POA: Diagnosis present

## 2021-10-13 MED ORDER — DIATRIZOATE MEGLUMINE & SODIUM 66-10 % PO SOLN
30.0000 mL | Freq: Once | ORAL | Status: AC
  Administered 2021-10-13: 30 mL

## 2021-10-13 NOTE — ED Triage Notes (Signed)
Patient arrives with husband c/o patients feeding tube being clogged this morning while trying to give medicine.

## 2021-10-13 NOTE — ED Notes (Signed)
Pt here for feeding tube  clogged.

## 2021-10-13 NOTE — ED Provider Notes (Signed)
[facility] Provider Note    Event Date/Time   First MD Initiated Contact with Patient 10/13/21 1718     (approximate)   History   Feeding Tube Clogged   HPI  Angela David is a 81 y.o. female with a history of tracheostomy and PEG tube who presents due to clogging of the PEG tube.  The husband states that the tube was recently changed from an 59 to a 73 [place] and he has had increased difficulty giving her her crushed up medications.  Today he gave a couple pills and the tube clogged.  He tried to dislodge it with water and [employer] without any improvement.  The patient denies pain or any other acute symptoms.     Physical Exam   Triage Vital Signs: ED Triage Vitals  Enc Vitals Group     BP 10/13/21 1452 116/60     Pulse Rate 10/13/21 1452 75     Resp 10/13/21 1452 (!) 22     Temp 10/13/21 1452 98.5 F (36.9 C)     Temp src --      SpO2 10/13/21 1452 90 %     Weight 10/13/21 1453 150 lb (68 kg)     Height 10/13/21 1453 '5\' 4"'$  (1.626 m)     Head Circumference --      Peak Flow --      Pain Score 10/13/21 1455 0     Pain Loc --      Pain Edu? --      Excl. in [place]? --     Most recent vital signs: Vitals:   10/13/21 1452  BP: 116/60  Pulse: 75  Resp: (!) 22  Temp: 98.5 F (36.9 C)  SpO2: 90%     General: Alert, comfortable appearing. CV:  Good peripheral perfusion.  Resp:  Normal effort.  Abd:  Soft and nontender.  No distention.  Other:  PEG tube in place.  Site minimally erythematous with no induration, open wounds or ulceration.   ED Results / Procedures / Treatments   Labs (all labs ordered are listed, but only abnormal results are displayed) Labs Reviewed - No data to display   EKG     RADIOLOGY  XR abdomen: I independently viewed and interpreted the images; the tube is in good position with no extravasation of contrast.  PROCEDURES:  Critical Care performed: No  FEEDING TUBE REPLACEMENT  Date/Time:  10/13/2021 6:50 PM  Performed by: Arta Silence, MD Authorized by: Arta Silence, MD  Consent: Verbal consent obtained. Risks and benefits: risks, benefits and alternatives were discussed Consent given by: patient Patient identity confirmed: verbally with patient Indications: tube blocked Local anesthesia used: no  Anesthesia: Local anesthesia used: no  Sedation: Patient sedated: no  Tube type: gastrostomy Patient position: recumbent Procedure type: replacement Tube size: 18 Fr Bulb inflation volume: 7 (ml) Bulb inflation fluid: normal saline Placement/position confirmation: x-ray Tube placement difficulty: none Patient tolerance: patient tolerated the procedure well with no immediate complications      MEDICATIONS ORDERED IN ED: Medications  diatrizoate meglumine-sodium (GASTROGRAFIN) 66-10 % solution 30 mL (30 mLs Per Tube Given 10/13/21 1918)     IMPRESSION / MDM / [place] / ED COURSE  I reviewed the triage vital signs and the nursing notes.  81 year old female with PMH as noted above presents due to a clogged PEG tube.  Differential diagnosis includes, but is not limited to, tube obstruction, other to malfunction.  Patient's presentation  is most consistent with acute presentation with potential threat to life or bodily function.  I attempted to flush the tube with normal saline.  The husband states that he attempted to flush it multiple times at home with no relief.  They have had increased problems with the tube since it was changed to a 16 [place].  I was able to obtain an 72 French tube and have swapped it out.  We will obtain an x-ray to confirm.  ----------------------------------------- 7:42 PM on 10/13/2021 -----------------------------------------  X-ray confirms tube in good position.  The patient is stable for discharge home.  Return precautions provided and she and her husband expressed understanding.   FINAL CLINICAL  IMPRESSION(S) / ED DIAGNOSES   Final diagnoses:  PEG tube malfunction ([place])     Rx / DC Orders   ED Discharge Orders     None        Note:  This document was prepared using Dragon voice recognition software and may include unintentional dictation errors.    Arta Silence, MD 10/13/21 1942

## 2021-10-16 ENCOUNTER — Encounter: Payer: Self-pay | Admitting: Primary Care

## 2021-10-16 ENCOUNTER — Ambulatory Visit: Payer: Medicare Other | Admitting: Surgery

## 2021-10-18 ENCOUNTER — Telehealth: Payer: Self-pay

## 2021-10-18 NOTE — Telephone Encounter (Signed)
Received letter from [employer]. Patient has had pulse oximetry testing devoice for >20d.   Spoke to patient's spouse, [name](DPR). He stated that patient has been dealing with a UTI for weeks and she has been unable to complete test. He stated that she completed test last night and he will be mailing device back today.  Nothing further needed.

## 2021-10-24 ENCOUNTER — Telehealth: Payer: Self-pay

## 2021-10-24 DIAGNOSIS — J9611 Chronic respiratory failure with hypoxia: Secondary | ICD-10-CM

## 2021-10-24 NOTE — Telephone Encounter (Signed)
ONO results faxed to [facility] office for review.

## 2021-10-25 ENCOUNTER — Ambulatory Visit: Payer: Medicare Other | Admitting: Podiatry

## 2021-10-26 NOTE — Telephone Encounter (Signed)
ATC patient-unable to leave vm due to mailbox being full.  Will call back.  

## 2021-10-26 NOTE — Telephone Encounter (Signed)
Overnight oximetry test on room air on 10/16/2021 showed patient spent 3 minutes with SPO2 less than 88%.  SPO2 low 86%, basal SPO2 93%.  Patient does not need to wear oxygen at bedtime.  Can discontinue nocturnal oxygen.  Patient needs to still continue to wear trilogy ventilator at night.

## 2021-10-27 ENCOUNTER — Telehealth: Payer: Self-pay

## 2021-10-27 NOTE — Telephone Encounter (Signed)
Created in error

## 2021-10-27 NOTE — Telephone Encounter (Signed)
ATC x2--unable to leave vm due to mailbox being full. ?Letter mailed to address on file.  ?Will close encounter per office protocol.  ? ?

## 2021-11-06 NOTE — Addendum Note (Signed)
Addended by: Claudette Head A on: 11/06/2021 09:53 AM   Modules accepted: Orders

## 2021-11-06 NOTE — Telephone Encounter (Signed)
Patient's spouse, [name](DPR) is aware of results and voiced his understanding.  Order placed to d/c oxygen. Patient will continue to wear trilogy nightly.  Nothing further needed.

## 2021-11-13 ENCOUNTER — Other Ambulatory Visit (HOSPITAL_COMMUNITY): Payer: Self-pay | Admitting: Surgery

## 2021-11-13 DIAGNOSIS — K9423 Gastrostomy malfunction: Secondary | ICD-10-CM

## 2021-11-24 ENCOUNTER — Other Ambulatory Visit (HOSPITAL_COMMUNITY): Payer: Self-pay | Admitting: Surgery

## 2021-11-24 ENCOUNTER — Ambulatory Visit (HOSPITAL_COMMUNITY)
Admission: RE | Admit: 2021-11-24 | Discharge: 2021-11-24 | Disposition: A | Discharge status: Home / Self Care | Payer: Medicare Other | Source: Ambulatory Visit | Attending: Surgery | Admitting: Surgery

## 2021-11-24 DIAGNOSIS — E1142 Type 2 diabetes mellitus with diabetic polyneuropathy: Secondary | ICD-10-CM | POA: Insufficient documentation

## 2021-11-24 DIAGNOSIS — K9423 Gastrostomy malfunction: Secondary | ICD-10-CM | POA: Insufficient documentation

## 2021-11-24 DIAGNOSIS — J3800 Paralysis of vocal cords and larynx, unspecified: Secondary | ICD-10-CM | POA: Insufficient documentation

## 2021-11-24 DIAGNOSIS — Z853 Personal history of malignant neoplasm of breast: Secondary | ICD-10-CM | POA: Diagnosis not present

## 2021-11-24 DIAGNOSIS — K219 Gastro-esophageal reflux disease without esophagitis: Secondary | ICD-10-CM | POA: Diagnosis not present

## 2021-11-24 HISTORY — PX: IR REPLC GASTRO/COLONIC TUBE PERCUT W/FLUORO: IMG2333

## 2021-11-24 NOTE — Progress Notes (Signed)
Ms. Oshea presented to IR today at [facility] for evaluation of leaking G tube.  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  Her tube was upsized from 16 to 18 fr, however it continues to leak.  On physical examination today, there is mild redness around the entry site.  The ostomy opening is quite a bit large than the 18 fr feed tube and the skin of the tract is incompletely healed.  We replaced the 18 fr tube with a 22 fr G tube.  I placed tape on the tube just above the bumper and added a silk suture which should keep the bumper from sliding back, therefore helping limit leakage.  We also measured her stoma length because her husband and she expressed interest in possibly switching to a Low profile Mic-Key tube in the future.  I am concerned about the ostomy opening, which does not look very well healed.  I believe it will continue to enlarge as we place larger bore feeding tubes.  We have placed a consult with [facility].  I am hopefully that they can provide some recommendations to help heal the ostomy tract.   If this does not work and the [place] tube is also unsuccessful, we should consider her for a GJ tube.  This would allow for less residual within the stomach.  Furthermore we could place the G port to suction to eliminate the gastric secretion, which in turn should stop the leakage and help heal the ostomy.  [place], MD [phone]

## 2021-11-25 ENCOUNTER — Other Ambulatory Visit (HOSPITAL_COMMUNITY): Payer: Self-pay | Admitting: Surgery

## 2021-11-25 DIAGNOSIS — K9423 Gastrostomy malfunction: Secondary | ICD-10-CM

## 2021-11-27 ENCOUNTER — Other Ambulatory Visit: Payer: Self-pay

## 2021-11-28 ENCOUNTER — Other Ambulatory Visit (HOSPITAL_COMMUNITY): Payer: Self-pay | Admitting: Surgery

## 2021-11-28 DIAGNOSIS — K9423 Gastrostomy malfunction: Secondary | ICD-10-CM

## 2021-11-29 ENCOUNTER — Ambulatory Visit (HOSPITAL_COMMUNITY)
Admission: RE | Admit: 2021-11-29 | Discharge: 2021-11-29 | Disposition: A | Discharge status: Home / Self Care | Payer: Medicare Other | Source: Ambulatory Visit | Attending: Interventional Radiology | Admitting: Interventional Radiology

## 2021-11-29 ENCOUNTER — Other Ambulatory Visit (HOSPITAL_COMMUNITY): Payer: Self-pay | Admitting: Surgery

## 2021-11-29 DIAGNOSIS — K9423 Gastrostomy malfunction: Secondary | ICD-10-CM | POA: Insufficient documentation

## 2021-11-29 HISTORY — PX: IR REPLACE G-TUBE SIMPLE WO FLUORO: IMG2323

## 2021-11-29 MED ORDER — LIDOCAINE HCL 1 % IJ SOLN
INTRAMUSCULAR | Status: DC | PRN
  Administered 2021-11-29: 8 mL

## 2021-11-29 MED ORDER — LIDOCAINE HCL 1 % IJ SOLN
INTRAMUSCULAR | Status: AC
  Filled 2021-11-29: qty 20

## 2021-11-30 ENCOUNTER — Ambulatory Visit (INDEPENDENT_AMBULATORY_CARE_PROVIDER_SITE_OTHER): Payer: Medicare Other | Admitting: Gastroenterology

## 2021-11-30 ENCOUNTER — Encounter: Payer: Self-pay | Admitting: Gastroenterology

## 2021-11-30 VITALS — BP 112/72 | HR 80 | Temp 98.3°F

## 2021-11-30 DIAGNOSIS — Z931 Gastrostomy status: Secondary | ICD-10-CM

## 2021-11-30 NOTE — Progress Notes (Signed)
Angela Darby, MD [address]  [place]  [place], [place] [address]  Main: [phone]  Fax: [phone]    Gastroenterology Consultation  Referring Provider:     Lorelee New, MD Primary Care Physician:  System, Provider Not In Primary Gastroenterologist:  Dr. Cephas David Reason for Consultation: Gastrostomy tube maintenance        HPI:   Angela David is a 81 y.o. female referred by Dr. Estanislado Spire, Provider Not In  for consultation & management of gastrostomy tube maintenance. 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  She had prolonged hospitalization in early 2023 secondary to acute on chronic hypoxic respiratory failure from pneumonia as well as suspected abdominal abscess.  She was also admitted in May secondary to UTI sepsis.  Patient had issues with the gastrostomy tube several times recently and has been replaced several times by IR.  Her gastrostomy tube has been falling out as well as tube feeds were leaking, therefore the tube has been upsized from 16 [place] gradually to 24 [place] on different occasions.  Recently by IR on 11/24/2021, it was stable and sutures were placed.  She had developed poorly healing stoma which has increased in size over time.  Patient is accompanied by her niece as well as her husband.  She is quite coherent and verbal, knows her history very well.  She denies any abdominal pain, tolerating tube feeds well, denies any leakage, reports having good bowel movements.  NSAIDs: None  Antiplts/Anticoagulants/Anti thrombotics: None  GI Procedures:  EGD and colonoscopy 10/14/2018 - Small hiatal hernia. - Benign-appearing esophageal stenosis. Dilated to 60m - Multiple gastric polyps. -  Gastritis. Biopsied. - Normal examined duodenum. DIAGNOSIS:  A. 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  - NEGATIVE FOR H. PYLORI, DYSPLASIA, AND MALIGNANCY.   - Two 6 to 12 mm polyps in the transverse colon, removed with a hot snare. Complete resection. Partial retrieval. - One 45 mm polyp in the proximal transverse colon. Treatment not successful. Tattooed. - One 7 mm polyp at the recto-sigmoid colon, removed with a hot snare. Resected and retrieved. - The distal rectum and anal verge are normal on retroflexion view. DIAGNOSIS:  A.  COLON POLYP X2, TRANSVERSE; HOT SNARE:  - TUBULOVILLOUS ADENOMA (MULTIPLE FRAGMENTS).  - NEGATIVE FOR HIGH-GRADE DYSPLASIA AND MALIGNANCY.   B.  COLON POLYP, PROXIMAL TRANSVERSE; COLD BIOPSY:  - TUBULAR ADENOMA.  - NEGATIVE FOR HIGH-GRADE DYSPLASIA AND MALIGNANCY.   C.  RECTUM POLYP, UPPER; HOT SNARE:  - TUBULAR ADENOMA.  - NEGATIVE FOR HIGH-GRADE DYSPLASIA AND MALIGNANCY.   Past Medical History:  Diagnosis Date   Breast cancer, right ([place] 2002   Tx'd with chemotherapy and XRT   Chronic respiratory failure ([place]    s/p tracheostomy in 2012; on ventilatory support at hs   Diabetic peripheral neuropathy ([place] 08/28/2016   GERD (gastroesophageal reflux disease)    Glaucoma    High cholesterol    History of colon polyps    Osteoarthritis    Personal history of chemotherapy 2002   BREAST CA   Personal history of radiation therapy 2002   BREAST CA   S/P percutaneous endoscopic gastrostomy (PEG) tube placement ([place]    T2DM (type 2  diabetes mellitus) ([place])    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   Ventral hernia    Vocal cord paralysis 2012    Past Surgical History:  Procedure Laterality Date   ABDOMINAL HYSTERECTOMY     BREAST BIOPSY Right 2008   neg   BREAST BIOPSY Right 06/30/2020   "Q" clip-path pending   BREAST EXCISIONAL BIOPSY Right 2002   positive   BREAST LUMPECTOMY Right 2002    chemo and radiation   BREAST LUMPECTOMY WITH RADIOFREQUENCY TAG IDENTIFICATION Right 10/18/2020   Procedure: BREAST LUMPECTOMY WITH RADIOFREQUENCY TAG IDENTIFICATION;  Surgeon: Jules Husbands, MD;  Location: [facility] ORS;  Service: General;  Laterality: Right;   BREAST SURGERY Right 2002   LUMPECTOMY, radiation, chemotherapy   CATARACT EXTRACTION, BILATERAL     CHOLECYSTECTOMY N/A 12/10/2017   Procedure: LAPAROSCOPIC CHOLECYSTECTOMY;  Surgeon: Benjamine Sprague, DO;  Location: [facility] ORS;  Service: General;  Laterality: N/A;   COLON RESECTION Right 11/06/2018   Procedure: HAND ASSISTED LAPAROSCOPIC COLON RESECTION, RIGHT;  Surgeon: Jules Husbands, MD;  Location: [facility] ORS;  Service: General;  Laterality: Right;   COLONOSCOPY  2009,12/30/2013   COLONOSCOPY WITH PROPOFOL N/A 04/16/2018   Tubulovillous polyp proximal transverse colon, large sessile polyp, proximal transverse colon;  Surgeon: Robert Bellow, MD;  Location: [facility] ENDOSCOPY;  Service: Endoscopy;  Laterality: N/A;   ESOPHAGOGASTRODUODENOSCOPY (EGD) WITH PROPOFOL N/A 10/14/2018   Procedure: ESOPHAGOGASTRODUODENOSCOPY (EGD) WITH PROPOFOL;  Surgeon: Lucilla Lame, MD;  Location: [facility] ENDOSCOPY;  Service: Endoscopy;  Laterality: N/A;   IR REPLACE G-TUBE SIMPLE WO FLUORO  11/29/2021   IR REPLC GASTRO/COLONIC TUBE PERCUT W/FLUORO  11/24/2021   TRACHEAL SURGERY  2013   TRACHEOSTOMY N/A    Dr Kathyrn Sheriff   XI ROBOTIC ASSISTED VENTRAL HERNIA N/A 10/18/2020   Procedure: XI ROBOTIC ASSISTED VENTRAL HERNIA;  Surgeon: Jules Husbands, MD;  Location: [facility] ORS;  Service: General;  Laterality: N/A;     Current Outpatient Medications:    acetaminophen (TYLENOL) 325 MG tablet, Place 2 tablets (650 mg total) into feeding tube every 6 (six) hours as needed for fever., Disp: , Rfl:    buPROPion (WELLBUTRIN SR) 150 MG 12 hr tablet, Take 150 mg by mouth 2 (two) times daily., Disp: , Rfl:    busPIRone (BUSPAR) 15 MG tablet, Take 15 mg by mouth 3 (three) times daily.,  Disp: , Rfl:    Continuous Blood Gluc Sensor (FREESTYLE LIBRE 2 SENSOR) MISC, 1 Device by Does not apply route daily., Disp: 30 each, Rfl: 1   denosumab (PROLIA) 60 MG/ML SOSY injection, Inject 1 mL by subcutaneous route., Disp: , Rfl:    diphenhydrAMINE (BENADRYL) 25 mg capsule, Take 1 capsule (25 mg total) by mouth every 6 (six) hours as needed for allergies or itching., Disp: 30 capsule, Rfl: 0   docusate (COLACE) 50 MG/5ML liquid, Place 10 mLs (100 mg total) into feeding tube daily as needed for mild constipation. (Patient taking differently: Place 100 mg into feeding tube every 12 (twelve) hours as needed for mild constipation or moderate constipation.), Disp: 100 mL, Rfl: 0   EPINEPHrine 0.3 mg/0.3 mL IJ SOAJ injection, Inject 0.3 mLs (0.3 mg dose) into the muscle once as needed for Anaphylaxis for up to 1 dose., Disp: , Rfl:    escitalopram (LEXAPRO) 20 MG tablet, TAKE 1 TABLET VIA TUBE DAILY. TAKE ALONG WITH 10 MG DOSE., Disp: , Rfl:    estradiol (ESTRACE) 0.1 MG/GM vaginal cream, Insert 1 applicatorful twice a week by vaginal route for  30 days., Disp: , Rfl:    ezetimibe (ZETIA) 10 MG tablet, Take 10 mg by mouth daily., Disp: , Rfl:    folic acid (FOLVITE) 1 MG tablet, Take 1 tablet by mouth daily., Disp: , Rfl:    gabapentin (NEURONTIN) 300 MG capsule, Place 300 mg into feeding tube every 8 (eight) hours., Disp: , Rfl:    HUMALOG KWIKPEN 100 UNIT/ML KwikPen, Inject into the skin., Disp: , Rfl:    hydrOXYzine (ATARAX) 25 MG tablet, Place 25 mg into feeding tube 2 (two) times daily., Disp: , Rfl:    insulin glargine (LANTUS) 100 UNIT/ML Solostar Pen, Inject 10 Units into the skin daily., Disp: 15 mL, Rfl: 11   Insulin Pen Needle (PEN NEEDLES 3/16") 31G X 5 MM MISC, 1 Container by Does not apply route daily., Disp: 30 each, Rfl: 1   ipratropium-albuterol (DUONEB) 0.5-2.5 (3) MG/3ML SOLN, Take 3 mLs by nebulization every 2 (two) hours as needed., Disp: 360 mL, Rfl:    loratadine (CLARITIN) 10  MG tablet, Place 1 tablet (10 mg total) into feeding tube daily., Disp: , Rfl:    lovastatin (MEVACOR) 20 MG tablet, Take 20 mg by mouth at bedtime., Disp: , Rfl:    meclizine (ANTIVERT) 12.5 MG tablet, Take 12.5 mg by mouth 3 (three) times daily as needed for dizziness., Disp: , Rfl:    Melatonin 5 MG CAPS, Take by mouth., Disp: , Rfl:    metoprolol tartrate (LOPRESSOR) 25 MG tablet, Place 1 tablet (25 mg total) into feeding tube daily. (Patient taking differently: Place 12.5 mg into feeding tube 2 (two) times daily.), Disp: , Rfl:    Mouthwashes (MOUTH RINSE) LIQD solution, 15 mLs by Mouth Rinse route every 2 (two) hours., Disp: , Rfl: 0   Multiple Vitamin (MULTIVITAMIN) LIQD, Place 15 mLs into feeding tube daily., Disp: , Rfl:    Nutritional Supplements (FEEDING SUPPLEMENT, KATE FARMS STANDARD 1.4,) LIQD liquid, Place 1,440 mLs into feeding tube daily., Disp: , Rfl:    pantoprazole (PROTONIX) 40 MG tablet, Take 40 mg by mouth daily., Disp: , Rfl:    Propylene Glycol (SYSTANE BALANCE) 0.6 % SOLN, Place 1 drop into both eyes 2 (two) times daily., Disp: , Rfl:    psyllium (HYDROCIL/METAMUCIL) 95 % PACK, Take 1 packet by mouth daily., Disp: , Rfl:    rOPINIRole (REQUIP) 0.5 MG tablet, Take 0.5 mg by mouth at bedtime., Disp: , Rfl:    sodium chloride HYPERTONIC 3 % nebulizer solution, Take 4 mLs by nebulization 2 (two) times daily., Disp: 750 mL, Rfl: 12   TOUJEO SOLOSTAR 300 UNIT/ML Solostar Pen, Inject into the skin., Disp: , Rfl:    Water For Irrigation, Sterile (FREE WATER) SOLN, Place 50 mLs into feeding tube every 4 (four) hours., Disp: , Rfl:    Zinc Oxide (BAZA PROTECT MOISTURE BARRIER) 12 % CREA, Apply 1 application twice a day by topical route., Disp: , Rfl:    Family History  Problem Relation Age of Onset   Cancer Father        bone cancer   Cancer Sister        lymphoma   Cancer Brother    Breast cancer Neg Hx      Social History   Tobacco Use   Smoking status: Never     Passive exposure: Past   Smokeless tobacco: Never  Vaping Use   Vaping Use: Never used  Substance Use Topics   Alcohol use: No   Drug use: No  Allergies as of 11/30/2021 - Review Complete 10/13/2021  Allergen Reaction Noted   Shellfish allergy Shortness Of Breath 07/25/2012   Sodium sulfite Shortness Of Breath 12/14/2013   Sulfa antibiotics Shortness Of Breath and Other (See Comments) 07/25/2012   Codeine Other (See Comments) 11/26/2012   Glucerna [alimentum] Itching 03/29/2020   Wound dressing adhesive Rash 02/20/2021    Review of Systems:    All systems reviewed and negative except where noted in HPI.   Physical Exam:  BP 112/72 (BP Location: Left Arm, Patient Position: Sitting, Cuff Size: Normal)   Pulse 80   Temp 98.3 F (36.8 C) (Oral)  No LMP recorded. Patient has had a hysterectomy.  General:   Alert,  Well-developed, well-nourished, pleasant and cooperative in NAD Head:  Normocephalic and atraumatic. Eyes:  Sclera clear, no icterus.   Conjunctiva pink. Ears:  Normal auditory acuity. Nose:  No deformity, discharge, or lesions. Mouth:  No deformity or lesions,oropharynx pink & moist. Neck:  Supple; no masses or thyromegaly, tracheostomy in place. Lungs:  Respirations even and unlabored.  Clear throughout to auscultation.   No wheezes, crackles, or rhonchi. No acute distress. Heart:  Regular rate and rhythm; no murmurs, clicks, rubs, or gallops. Abdomen:  Normal bowel sounds. Soft, non-tender and non-distended without masses, hepatosplenomegaly or hernias noted.  No guarding or rebound tenderness, G-tube in place.   Rectal: Not performed Msk:  Symmetrical without gross deformities. Good, equal movement & strength bilaterally. Pulses:  Normal pulses noted. Extremities:  No clubbing or edema.  No cyanosis. Neurologic:  Alert and oriented x3;  grossly normal neurologically. Skin:  Intact without significant lesions or rashes. No jaundice. Psych:  Alert and  cooperative. Normal mood and affect.  Imaging Studies: Reviewed  Assessment and Plan:   Temprance Wyre is a 81 y.o. female with history of right breast cancer, status postlumpectomy, s/p chemo and XRT, vocal cord paralysis s/p trach and IR placement of gastrostomy tube.  Patient had developed large stoma from the gastrostomy tube placement due to poor healing.  The gastrostomy tube has been upsized from 32 [place] to 24 [place], with tape and sutures in place to secure the tube and allow healing.  Patient is currently tolerating tube feeds and denies any G-tube issues at this time.  If she develops any G-tube issues, she needs to follow-up with IR because the plan has already been laid out to the patient and her family   Follow up as needed   Angela Darby, MD

## 2021-12-25 ENCOUNTER — Other Ambulatory Visit: Payer: Medicare Other

## 2021-12-26 ENCOUNTER — Ambulatory Visit: Payer: Medicare Other | Admitting: Oncology

## 2022-01-03 ENCOUNTER — Ambulatory Visit (INDEPENDENT_AMBULATORY_CARE_PROVIDER_SITE_OTHER): Payer: Medicare Other | Admitting: Internal Medicine

## 2022-01-03 ENCOUNTER — Encounter: Payer: Self-pay | Admitting: Internal Medicine

## 2022-01-03 VITALS — BP 110/70 | HR 78 | Temp 98.0°F | Ht 64.0 in | Wt 150.0 lb

## 2022-01-03 DIAGNOSIS — J9611 Chronic respiratory failure with hypoxia: Secondary | ICD-10-CM | POA: Diagnosis not present

## 2022-01-03 DIAGNOSIS — J9612 Chronic respiratory failure with hypercapnia: Secondary | ICD-10-CM

## 2022-01-03 NOTE — Patient Instructions (Addendum)
Continue trach therapy Continue ventilator therapy Continue PEG tube feedings-follow up [facility] Follow-up ENT Follow PT instructions

## 2022-01-03 NOTE — Progress Notes (Signed)
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  Evidence of pneumonia, MRSA. 01/22: CT Head revealed no CT evidence for acute intracranial abnormality. Atrophy and presumed chronic small vessel ischemic change of the white matter.  Sinusitis noted. 01/22: CTA Chest revealed no pulmonary embolus. Dilated fluid-filled esophagus from the thoracic inlet to the gastroesophageal junction, may be reflux or dysmotility. Achalasia also considered. Mild dependent opacities in both lower lobes, likely atelectasis. Given esophageal dilatation, aspiration is also considered. Subacute right rib fractures. Some interval incomplete fracture healing since October 2020 injury. Stable right upper lobe scarring and benign pulmonary nodules. Aortic Atherosclerosis (ICD10-I70.0). 01/23: Pt admitted to ICU  03/22/19- Patient is clincally improved, of mechanical ventilation.  Starting PT/OT with plan to transition to hospitalist service. 1/28 returned to ICU due to episode of loss of consciousness on MedSurg floor.  This was due to recurrent issues with hypercapnia it was determined patient would need at the very least nighttime ventilation.  Patient placed on mechanical ventilation. 1/29 off vent during day, eating by mouth, speech evaluated.  Nighttime ventilation. 1/30 remains off vent during day, eating by mouth.  Continue nighttime mechanical ventilation.  Referred to DME for  Trilogy home ventilator. 2/01 husband and niece to be trained  on Trilogy vent. 2/02 unable to use Trilogy vent last night due to alarms going off.  DME company needs to review settings 2/03 successful overnight use of Trilogy ventilator, stable and ready for discharge    CHIEF COMPLAINT:  Chronic respiratory failure Chronic vent dependent Chronic trach chronic PEG  history of MRSA pneumonia   HISTORY OF PRESENT ILLNESS: Patient has chronic trach Chronic ventilator No acute issues at this time Vent dependent at nighttime Cuffed nonfenestrated trach Patient vocalizes by using a finger  Patient has excellent compliance with ventilator usage at nighttime 100% compliance for days and greater than 4 hours Minute ventilation is 7.4 respiratory rate at 16  No exacerbation at this time No evidence of heart failure at this time No evidence or signs of infection at this time No respiratory distress No fevers, chills, nausea, vomiting, diarrhea No evidence of lower extremity edema No evidence hemoptysis   Patient still needs trilogy ventilator for survival  Primary nutrition no support is via PEG tube  No exacerbation at this time No evidence of heart failure at this time No evidence or signs of infection at this time No respiratory distress No fevers, chills, nausea, vomiting, diarrhea No evidence of lower extremity edema No evidence hemoptysis     PAST MEDICAL HISTORY :   has a past medical history of Breast cancer, right ([place]) (2002), Chronic respiratory failure ([place]), Diabetic peripheral neuropathy ([place]) (08/28/2016), GERD (gastroesophageal reflux disease), Glaucoma, High cholesterol, History of colon polyps, Osteoarthritis, Personal history of chemotherapy (2002), Personal history of radiation therapy (2002), S/P percutaneous endoscopic gastrostomy (PEG) tube placement ([place]), T2DM (type 2 diabetes mellitus) ([place]), 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  has a past surgical history that includes Abdominal hysterectomy; Colonoscopy (2009,12/30/2013); Tracheal surgery (2013); Breast surgery (  Right, 2002); Tracheostomy (N/A); Cholecystectomy (N/A, 12/10/2017); Colonoscopy with propofol (N/A, 04/16/2018); Esophagogastroduodenoscopy (egd) with propofol (N/A, 10/14/2018); Colon resection (Right, 11/06/2018); Breast lumpectomy (Right, 2002); Breast biopsy (Right, 2008); Breast excisional biopsy (Right, 2002); Breast biopsy (Right, 06/30/2020); Cataract extraction, bilateral; Breast lumpectomy with radiofrequency tag identification (Right, 10/18/2020); XI robotic assisted ventral hernia (N/A, 10/18/2020); IR Replc Gastro/Colonic Tube Percut W/Fluoro (11/24/2021); and IR REPLACE G-TUBE SIMPLE WO FLUORO (11/29/2021). Prior to Admission medications   Medication Sig Start Date End Date Taking? Authorizing Provider  acetaminophen (TYLENOL) 325 MG tablet Take 2 tablets (650 mg total) by mouth every 6 (six) hours as needed for moderate pain or fever. 04/01/19   Tyler Pita, MD  Biotin 1000 MCG tablet Take 1,000 mcg by mouth daily.     [provider]  busPIRone (BUSPAR) 15 MG tablet Take 1 tablet (15 mg total) by mouth 2 (two) times daily. 04/01/19   Tyler Pita, MD  Calcium Carbonate-Vitamin D 600-400 MG-UNIT tablet Take 1 tablet by mouth daily.     [provider]  escitalopram (LEXAPRO) 20 MG tablet Take 20 mg by mouth daily.    [provider]  feeding supplement, ENSURE ENLIVE, (ENSURE ENLIVE) LIQD Take 237 mLs by mouth 2 (two) times daily between meals. 04/02/19   Tyler Pita, MD  folic acid (FOLVITE) 1 MG tablet Take 1 mg by mouth daily.    [provider]  gabapentin (NEURONTIN) 400 MG capsule Take 1 capsule (400 mg total) by mouth 3 (three) times daily. 04/01/19   Tyler Pita, MD  ipratropium-albuterol (DUONEB) 0.5-2.5 (3) MG/3ML SOLN Take 3 mLs by nebulization every 6 (six) hours  as needed. 04/01/19   Tyler Pita, MD  iron polysaccharides (FERREX 150) 150 MG capsule Take by mouth. 04/30/18 04/30/19  [provider]  lovastatin (MEVACOR) 20 MG tablet Take 20 mg by mouth at bedtime.    [provider]  meclizine (ANTIVERT) 25 MG tablet Take 25 mg by mouth 2 (two) times daily as needed for dizziness.     [provider]  Melatonin 5 MG TABS Take 1 tablet (5 mg total) by mouth at bedtime. 04/01/19   Tyler Pita, MD  metFORMIN (GLUCOPHAGE) 1000 MG tablet Take 1,000 mg by mouth 2 (two) times daily as needed (for BS >110 in the morning and >150 in the evening).     [provider]  methocarbamol (ROBAXIN) 500 MG tablet Take 1 tablet (500 mg total) by mouth every 8 (eight) hours as needed for muscle spasms. 12/29/18   Tylene Fantasia, PA-C  metoprolol tartrate (LOPRESSOR) 25 MG tablet Take 1 tablet (25 mg total) by mouth 2 (two) times daily. 04/01/19   Tyler Pita, MD  Nebulizers (COMPRESSOR/NEBULIZER) MISC Nebulizer with supplies, trach collar for med admin. 04/01/19   Tyler Pita, MD  ondansetron (ZOFRAN) 4 MG tablet Take 1 tablet (4 mg total) by mouth every 8 (eight) hours as needed for nausea or vomiting. 11/09/18   Pabon, Diego F, MD  pantoprazole (PROTONIX) 40 MG tablet Take 40 mg by mouth daily.     [provider]  RESTASIS 0.05 % ophthalmic emulsion Place 1 drop into both eyes 2 (two) times daily.     [provider]  rOPINIRole (REQUIP) 0.5 MG tablet Take 0.5 mg by mouth at bedtime.  06/09/18   [provider]   Allergies  Allergen Reactions   Shellfish Allergy Shortness Of Breath    respitatory  distress    Sodium Sulfite Shortness Of Breath    Severe Congestion that creates inability to breath   Sulfa Antibiotics Shortness Of Breath and Other (See Comments)    respitatory distress   Codeine Other (See Comments)    Per patient "it kept her up all night"   Glucerna [Alimentum] Itching    Wound Dressing Adhesive Rash    FAMILY HISTORY:  family history includes Cancer in her brother, father, and sister. SOCIAL HISTORY:  reports that she has never smoked. She has been exposed to tobacco smoke. She has never used smokeless tobacco. She reports that she does not drink alcohol and does not use drugs.    There were no vitals taken for this visit.    Review of Systems: Gen:  Denies  fever, sweats, chills weight loss  HEENT: Denies blurred vision, double vision, ear pain, eye pain, hearing loss, nose bleeds, sore throat Cardiac:  No dizziness, chest pain or heaviness, chest tightness,edema, No JVD Resp:   No cough, -sputum production, -shortness of breath,-wheezing, -hemoptysis,  Other:  All other systems negative    Physical Examination:   General Appearance: No distress  EYES PERRLA, EOM intact.   NECK Supple, No JVD TRACH in place Pulmonary: normal breath sounds, No wheezing.  CardiovascularNormal S1,S2.  No m/r/g.   Abdomen: Benign, Soft, non-tender. ALL OTHER ROS ARE NEGATIVE     ASSESSMENT AND PLAN SYNOPSIS  81 year old pleasant white female seen today for follow-up acute on chronic hypoxic and hypercapnic respiratory failure for MRSA pneumonia with a history of COPD complicated by vocal cord dysfunction status post tracheostomy status post PEG tube placement with chronic ventilatory support   Chronic hypoxic and hypercapnic respiratory failure Patient still needs trilogy ventilator therapy for survival This helps reduce hospital admissions and recurrent COPD exacerbations Compliance report reviewed in detail Patient has excellent compliance  Chronic vocal cord dysfunction Continue tracheostomy ENT in [facility]  Chronic aspiration syndrome Patient now has PEG tube Patient still remains at risk for aspiration Follow-up with general surgery in [place] PT at home Follow-up speech evaluation as needed  Total time spent 35  minutes     MEDICATION ADJUSTMENTS/LABS AND TESTS ORDERED: Continue trach therapy Continue ventilator therapy Continue PEG tube feedings Follow-up ENT   CURRENT MEDICATIONS REVIEWED AT LENGTH WITH PATIENT TODAY   Patient/Family are satisfied with Plan of action and management. All questions answered   FOLLOW UP IN 1 YEAR  TOTAL TIME SPENT 32 mins   Keats Kingry Patricia Pesa, M.D.  [facility]  Medical Director [place] Director [facility] Cardio-Pulmonary Department

## 2022-01-09 ENCOUNTER — Other Ambulatory Visit (HOSPITAL_COMMUNITY): Payer: Self-pay | Admitting: Radiology

## 2022-01-09 DIAGNOSIS — R633 Feeding difficulties, unspecified: Secondary | ICD-10-CM

## 2022-01-10 ENCOUNTER — Ambulatory Visit (HOSPITAL_COMMUNITY)
Admission: RE | Admit: 2022-01-10 | Discharge: 2022-01-10 | Disposition: A | Discharge status: Home / Self Care | Payer: Medicare Other | Source: Ambulatory Visit | Attending: Radiology | Admitting: Radiology

## 2022-01-10 ENCOUNTER — Other Ambulatory Visit (HOSPITAL_COMMUNITY): Payer: Self-pay | Admitting: Radiology

## 2022-01-10 DIAGNOSIS — R633 Feeding difficulties, unspecified: Secondary | ICD-10-CM | POA: Insufficient documentation

## 2022-01-10 DIAGNOSIS — Z431 Encounter for attention to gastrostomy: Secondary | ICD-10-CM | POA: Insufficient documentation

## 2022-01-10 HISTORY — PX: IR GASTR TUBE CONVERT GASTR-JEJ PER W/FL MOD SED: IMG2332

## 2022-01-10 MED ORDER — IOHEXOL 300 MG/ML  SOLN
50.0000 mL | Freq: Once | INTRAMUSCULAR | Status: AC | PRN
  Administered 2022-01-10: 20 mL

## 2022-01-10 MED ORDER — LIDOCAINE VISCOUS HCL 2 % MT SOLN
OROMUCOSAL | Status: AC
  Filled 2022-01-10: qty 15

## 2022-01-10 MED ORDER — LIDOCAINE HCL 1 % IJ SOLN
INTRAMUSCULAR | Status: AC
  Administered 2022-01-10: 5 mL via INTRADERMAL
  Filled 2022-01-10: qty 20

## 2022-01-13 ENCOUNTER — Emergency Department (HOSPITAL_COMMUNITY)
Admission: EM | Admit: 2022-01-13 | Discharge: 2022-01-13 | Disposition: A | Discharge status: Home / Self Care | Payer: Medicare Other | Attending: Emergency Medicine | Admitting: Emergency Medicine

## 2022-01-13 ENCOUNTER — Other Ambulatory Visit: Payer: Self-pay

## 2022-01-13 ENCOUNTER — Encounter (HOSPITAL_COMMUNITY): Payer: Self-pay

## 2022-01-13 DIAGNOSIS — Z794 Long term (current) use of insulin: Secondary | ICD-10-CM | POA: Diagnosis not present

## 2022-01-13 DIAGNOSIS — K9423 Gastrostomy malfunction: Secondary | ICD-10-CM | POA: Insufficient documentation

## 2022-01-13 NOTE — ED Triage Notes (Signed)
Patient 's husband reports that the patient's feeding tube is leaking x 3 days. Husband states she has lost a lot of her medications and feeding.

## 2022-01-13 NOTE — ED Provider Notes (Signed)
[facility] DEPT Provider Note   CSN: [id number] Arrival date & time: 01/13/22  1317     History  No chief complaint on file.   Angela David is a 81 y.o. female.  HPI 81 year old female with tracheostomy and chronic feeding tube presents with leaking around her GJ tube.  On 11/15 she had her gastric tube converted to a GJ tube due to issues with leaking.  Husband and patient state that its been leaking ever since and seems to be worse than when she had the G-tube.  The J-tube works but gives her diarrhea whenever medicines or fluids are given through there.  The G-tube seems to leak as soon as meds and food are given.  She has a little bit of redness around her skin.  She has chronic abdominal pain but no new abdominal pain or fever.  Home Medications Prior to Admission medications   Medication Sig Start Date End Date Taking? Authorizing Provider  acetaminophen (TYLENOL) 325 MG tablet Place 2 tablets (650 mg total) into feeding tube every 6 (six) hours as needed for fever. 03/06/21   Bradly Bienenstock, NP  buPROPion (WELLBUTRIN SR) 150 MG 12 hr tablet Take 150 mg by mouth 2 (two) times daily. 11/13/21   [provider]  busPIRone (BUSPAR) 15 MG tablet Take 15 mg by mouth 3 (three) times daily.    [provider]  Continuous Blood Gluc Sensor (FREESTYLE LIBRE 2 SENSOR) MISC 1 Device by Does not apply route daily. 07/06/21   Bradly Bienenstock, NP  denosumab (PROLIA) 60 MG/ML SOSY injection Inject 1 mL by subcutaneous route.    [provider]  diphenhydrAMINE (BENADRYL) 25 mg capsule Take 1 capsule (25 mg total) by mouth every 6 (six) hours as needed for allergies or itching. 03/06/21   Bradly Bienenstock, NP  docusate (COLACE) 50 MG/5ML liquid Place 10 mLs (100 mg total) into feeding tube daily as needed for mild constipation. Patient taking differently: Place 100 mg into feeding tube every 12 (twelve) hours as needed for mild  constipation or moderate constipation. 03/06/21   Darel Hong D, NP  EPINEPHrine 0.3 mg/0.3 mL IJ SOAJ injection Inject 0.3 mLs (0.3 mg dose) into the muscle once as needed for Anaphylaxis for up to 1 dose. 09/10/18   [provider]  escitalopram (LEXAPRO) 20 MG tablet TAKE 1 TABLET VIA TUBE DAILY. TAKE ALONG WITH 10 MG DOSE. 11/13/21   [provider]  estradiol (ESTRACE) 0.1 MG/GM vaginal cream Insert 1 applicatorful twice a week by vaginal route for 30 days.    [provider]  ezetimibe (ZETIA) 10 MG tablet Take 10 mg by mouth daily. 11/20/21   [provider]  folic acid (FOLVITE) 1 MG tablet Take 1 tablet by mouth daily. 12/21/19   [provider]  gabapentin (NEURONTIN) 300 MG capsule Place 300 mg into feeding tube every 8 (eight) hours.    [provider]  HUMALOG KWIKPEN 100 UNIT/ML KwikPen Inject into the skin. 11/06/21   [provider]  hydrOXYzine (ATARAX) 25 MG tablet Place 25 mg into feeding tube 2 (two) times daily.    [provider]  insulin glargine (LANTUS) 100 UNIT/ML Solostar Pen Inject 10 Units into the skin daily. 07/06/21   Darel Hong D, NP  Insulin Pen Needle (PEN NEEDLES 3/16") 31G X 5 MM MISC 1 Container by Does not apply route daily. 07/06/21   Bradly Bienenstock, NP  ipratropium-albuterol (DUONEB) 0.5-2.5 (  3) MG/3ML SOLN Take 3 mLs by nebulization every 2 (two) hours as needed. 03/06/21   Bradly Bienenstock, NP  loratadine (CLARITIN) 10 MG tablet Place 1 tablet (10 mg total) into feeding tube daily. 03/07/21   Bradly Bienenstock, NP  lovastatin (MEVACOR) 20 MG tablet Take 20 mg by mouth at bedtime.    [provider]  meclizine (ANTIVERT) 12.5 MG tablet Take 12.5 mg by mouth 3 (three) times daily as needed for dizziness.    [provider]  Melatonin 5 MG CAPS Take by mouth.    [provider]  metoprolol tartrate (LOPRESSOR) 25 MG tablet Place 1 tablet (25 mg total) into  feeding tube daily. Patient taking differently: Place 12.5 mg into feeding tube 2 (two) times daily. 03/07/21   Bradly Bienenstock, NP  Mouthwashes (MOUTH RINSE) LIQD solution 15 mLs by Mouth Rinse route every 2 (two) hours. 03/06/21   Bradly Bienenstock, NP  Multiple Vitamin (MULTIVITAMIN) LIQD Place 15 mLs into feeding tube daily. 03/07/21   Bradly Bienenstock, NP  Nutritional Supplements (FEEDING SUPPLEMENT, KATE FARMS STANDARD 1.4,) LIQD liquid Place 1,440 mLs into feeding tube daily. 03/07/21   Bradly Bienenstock, NP  pantoprazole (PROTONIX) 40 MG tablet Take 40 mg by mouth daily. 11/10/21   [provider]  Propylene Glycol (SYSTANE BALANCE) 0.6 % SOLN Place 1 drop into both eyes 2 (two) times daily.    [provider]  psyllium (HYDROCIL/METAMUCIL) 95 % PACK Take 1 packet by mouth daily.    [provider]  rOPINIRole (REQUIP) 0.5 MG tablet Take 0.5 mg by mouth at bedtime. 10/27/21   [provider]  sodium chloride HYPERTONIC 3 % nebulizer solution Take 4 mLs by nebulization 2 (two) times daily. 07/05/21   Bradly Bienenstock, NP  TOUJEO SOLOSTAR 300 UNIT/ML Solostar Pen Inject into the skin. 11/06/21   [provider]  Water For Irrigation, Sterile (FREE WATER) SOLN Place 50 mLs into feeding tube every 4 (four) hours. 03/06/21   Bradly Bienenstock, NP  Zinc Oxide (BAZA PROTECT MOISTURE BARRIER) 12 % CREA Apply 1 application twice a day by topical route. 08/08/21   [provider]      Allergies    Shellfish allergy, Sodium sulfite, Sulfa antibiotics, Codeine, Glucerna [alimentum], and Wound dressing adhesive    Review of Systems   Review of Systems  Constitutional:  Negative for fever.  Gastrointestinal:  Positive for abdominal pain (chronic).    Physical Exam Updated Vital Signs BP (!) 106/90 (BP Location: Left Arm)   Pulse 87   Temp 98 F (36.7 C) (Oral)   Resp 18   Ht '5\' 4"'$  (1.626 m)   Wt 68 kg   SpO2 96%   BMI 25.75 kg/m  Physical  Exam Vitals and nursing note reviewed.  Constitutional:      Appearance: She is well-developed.  HENT:     Head: Normocephalic and atraumatic.  Pulmonary:     Effort: Pulmonary effort is normal.  Abdominal:     General: There is no distension.     Palpations: Abdomen is soft.     Tenderness: There is no abdominal tenderness.     Comments: There is some redness to the skin around the gastrostomy site c/w early skin breakdown.  Skin:    General: Skin is warm and dry.  Neurological:     Mental Status: She is alert.     ED Results / Procedures / Treatments  Labs (all labs ordered are listed, but only abnormal results are displayed) Labs Reviewed - No data to display  EKG None  Radiology No results found.  Procedures Procedures    Medications Ordered in ED Medications - No data to display  ED Course/ Medical Decision Making/ A&P                           Medical Decision Making  Chart review shows patient just had her GJ tube placed by IR 3 days ago.  Due to this I discussed case with Dr. Annamaria Boots.  He recommends that I can place about 3 cc of saline in her balloon.  I did this to see if this would help tamponade the gastric leaking.  Otherwise, she can follow-up with IR in 2 days per IR recommendations. I discussed supportive care for the skin which seems irritated from the leaking.        Final Clinical Impression(s) / ED Diagnoses Final diagnoses:  Drainage from gastrostomy tube site [facility])    Rx / DC Orders ED Discharge Orders     None         Sherwood Gambler, MD 01/13/22 1539

## 2022-01-13 NOTE — Discharge Instructions (Signed)
Follow-up with the [facility] intervention[facility] radiology clinic in 2 days if still having problems.

## 2022-01-15 ENCOUNTER — Ambulatory Visit: Payer: Medicare Other | Admitting: Internal Medicine

## 2022-01-30 IMAGING — CT CT HEAD W/O CM
3 series · 16 of 46 positions shown, 19 images · non-contrast
Comparison: Head CT 03/29/2019.

CLINICAL DATA: Head trauma, minor. Additional history provided:
Unwitnessed mechanical fall hitting head.

EXAM:
CT HEAD WITHOUT CONTRAST
TECHNIQUE: Contiguous axial images were obtained from the base of the skull
through the vertex without intravenous contrast.

[Series 2: head wo · axial · 0.40mm/px · z∈[+228,+348]mm · 10 of 29 slices shown, 13 images]
[im 3/29  brain]
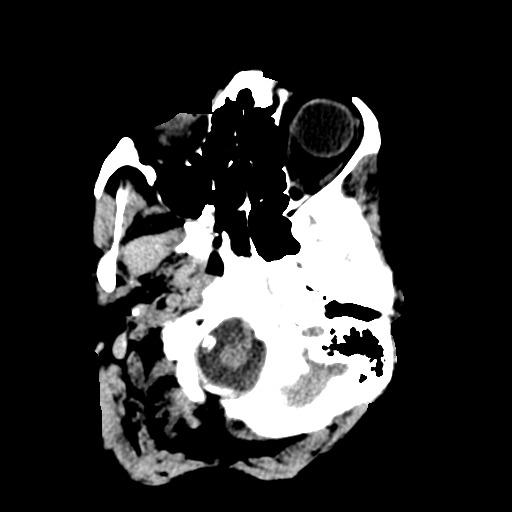
[im 3/29  bone]
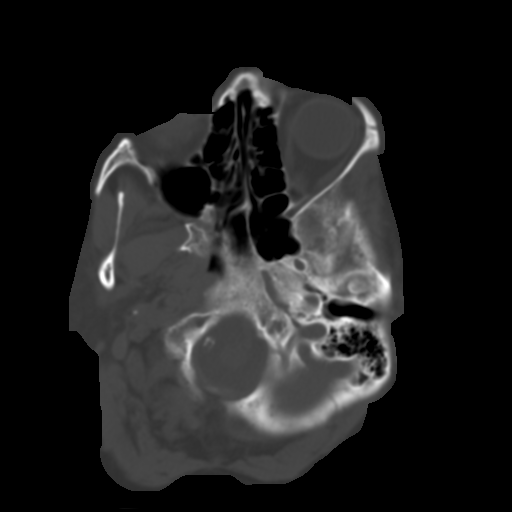
[im 6/29  brain]
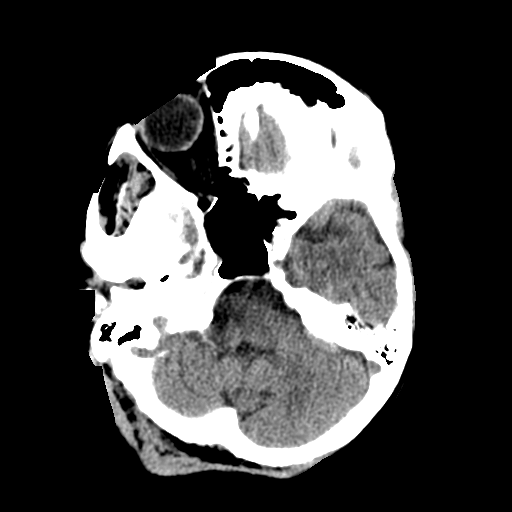
[im 8/29  brain]
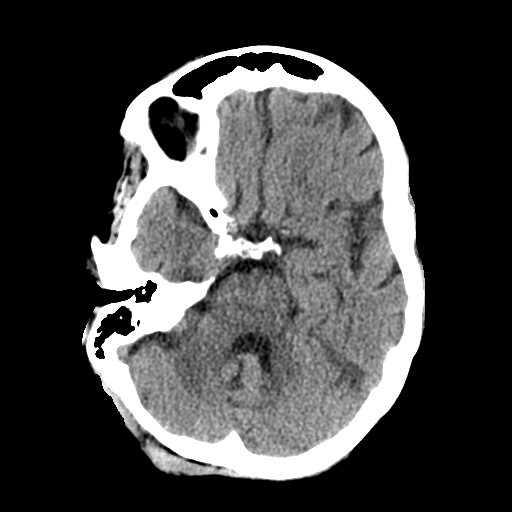
[im 11/29  brain]
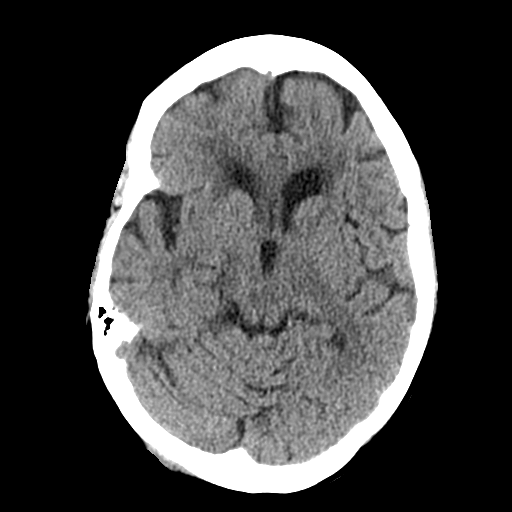
[im 14/29  brain]
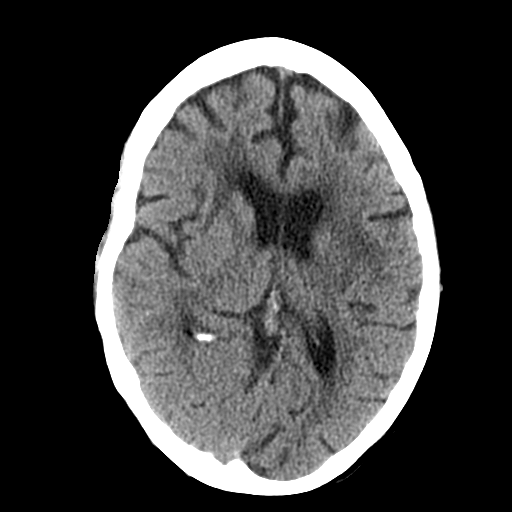
[im 14/29  bone]
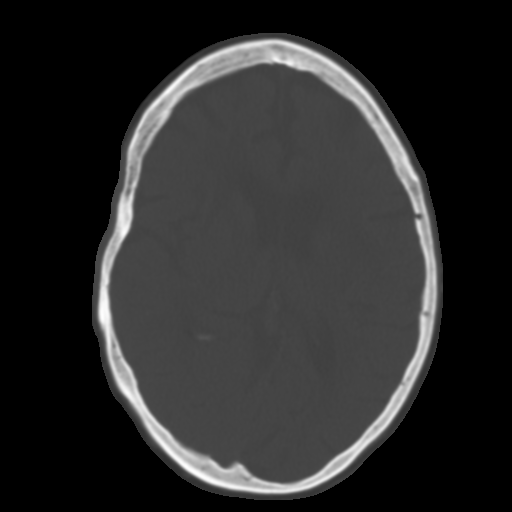
[im 16/29  brain]
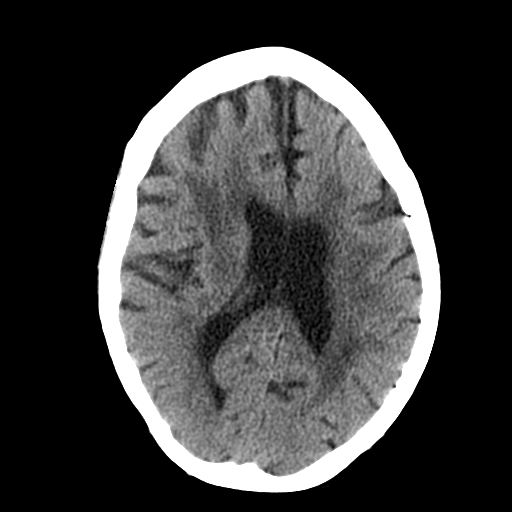
[im 19/29  brain]
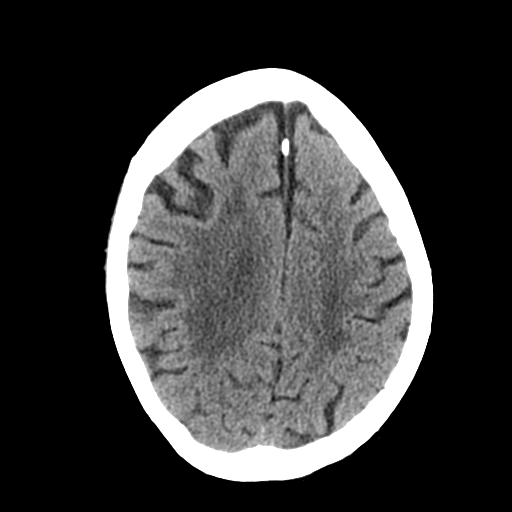
[im 22/29  brain]
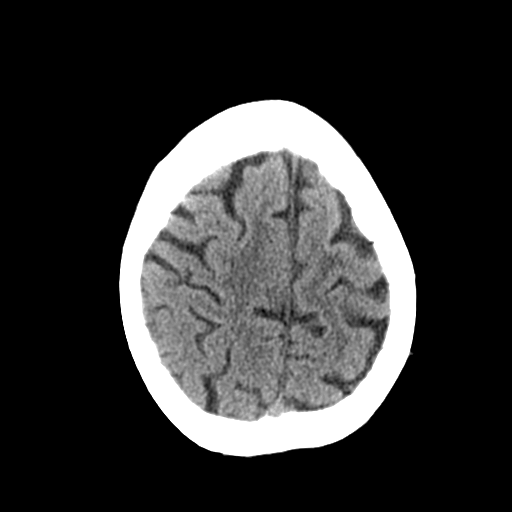
[im 24/29  brain]
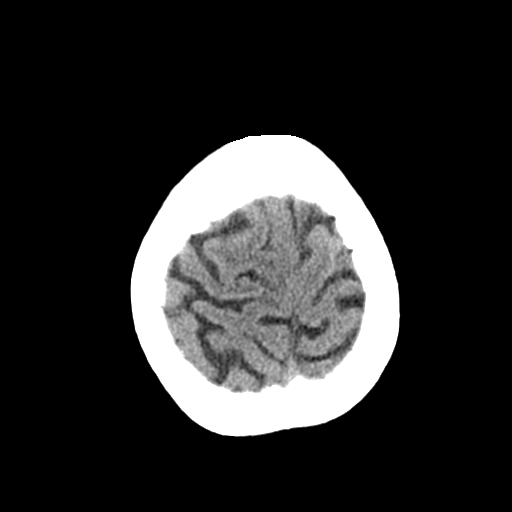
[im 24/29  bone]
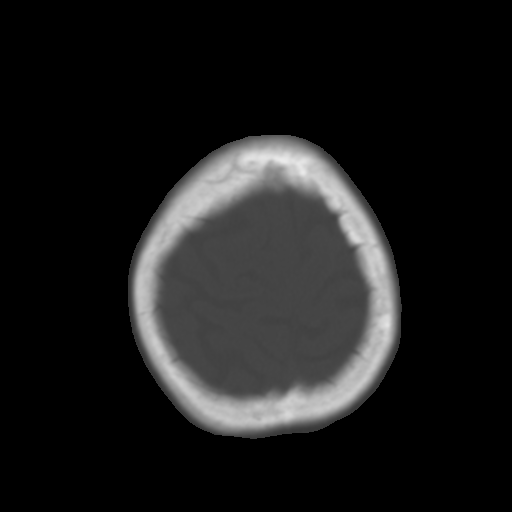
[im 27/29  brain]
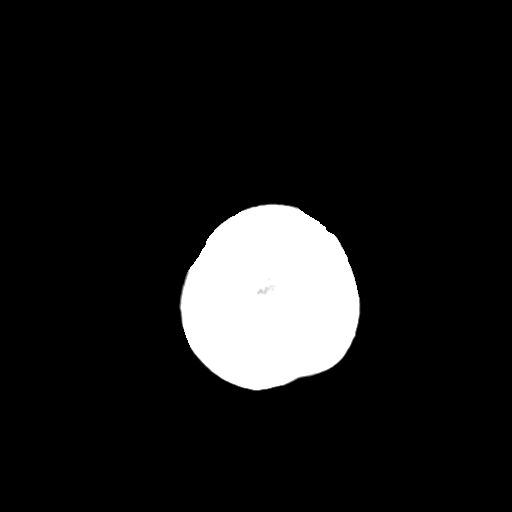

[Series 4: coronal soft tissue · coronal · 0.31mm/px · 3 of 65 slices shown]
[im 22/65  brain]
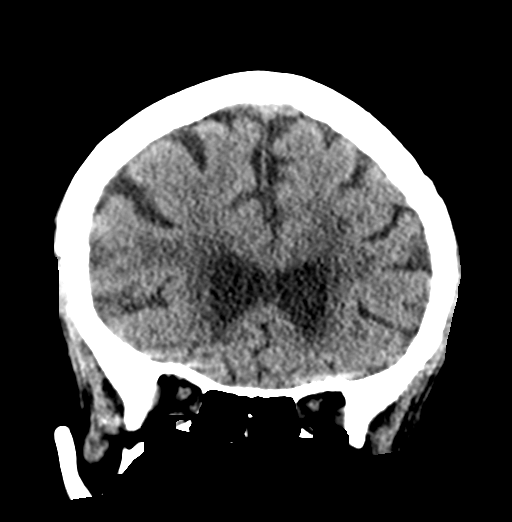
[im 29/65  brain]
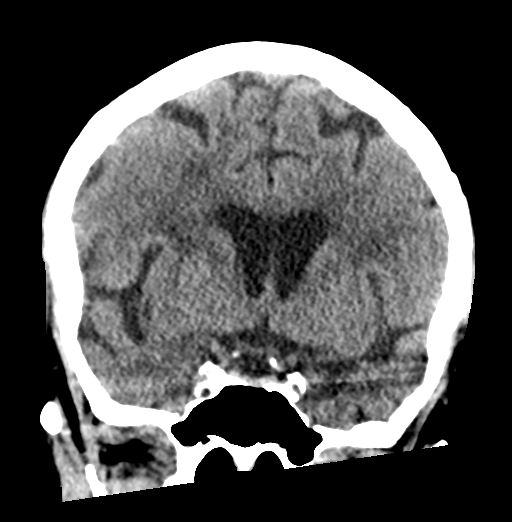
[im 36/65  brain]
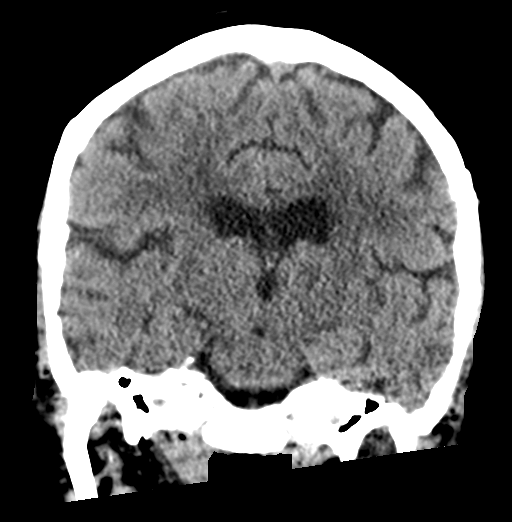

[Series 5: sagittal soft tissue · sagittal · 0.32mm/px · 3 of 54 slices shown]
[im 21/54  brain]
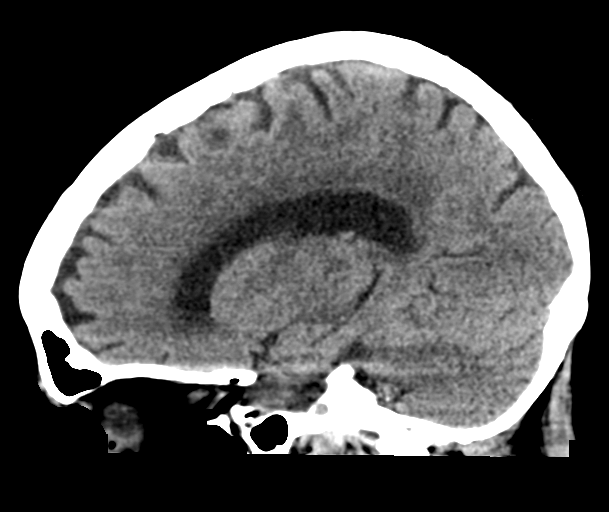
[im 27/54  brain]
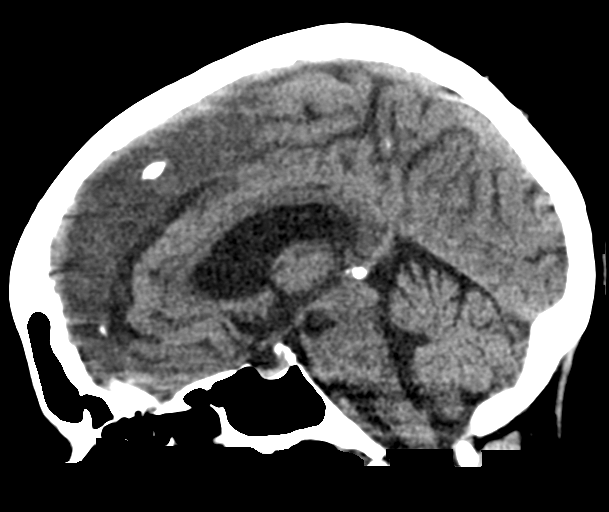
[im 33/54  brain]
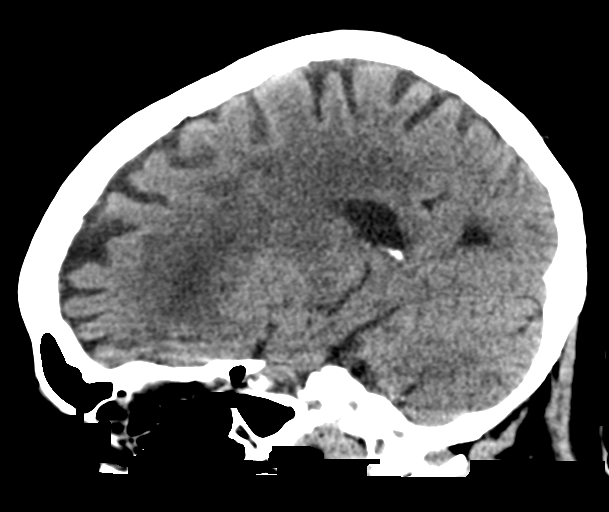

[16 of 46 positions shown; findings below may reference images not displayed]

FINDINGS: Brain:

Mild generalized cerebral atrophy with advanced chronic small vessel
ischemic disease, stable as compared to the prior head CT of
03/29/2019.

There is no acute intracranial hemorrhage.

No demarcated cortical infarct.

No extra-axial fluid collection.

No evidence of intracranial mass.

No midline shift.

Vascular: No hyperdense vessel.  Atherosclerotic calcifications

Skull: Normal. Negative for fracture or focal lesion.

Sinuses/Orbits: Visualized orbits show no acute finding. No
significant paranasal sinus disease at the imaged levels.
IMPRESSION: No evidence of acute intracranial abnormality.

Mild cerebral atrophy with advanced chronic small vessel ischemic
disease, stable as compared to the head CT of 03/29/2019

## 2022-01-30 IMAGING — CR DG SHOULDER 2+V*R*
1 series · 3 of 3 positions shown · non-contrast
Comparison: 11/22/2010

CLINICAL DATA: Fell onto right shoulder

EXAM:
RIGHT SHOULDER - 2+ VIEW

[Series 1: dg shoulder right · 0.14mm/px · 3 of 3 slices shown]
[im 1/3]
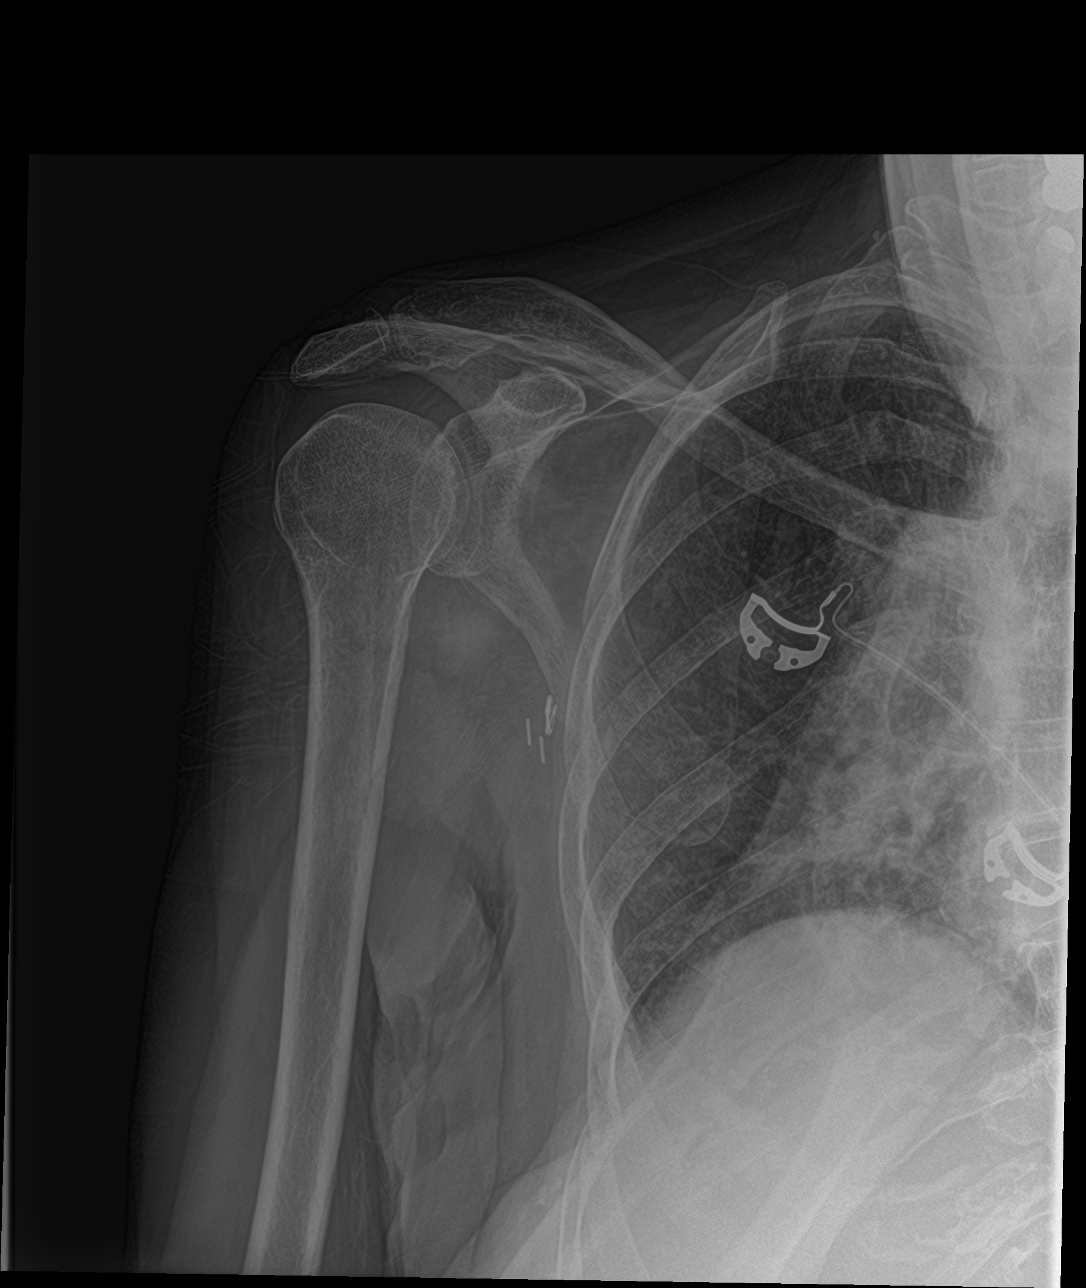
[im 2/3]
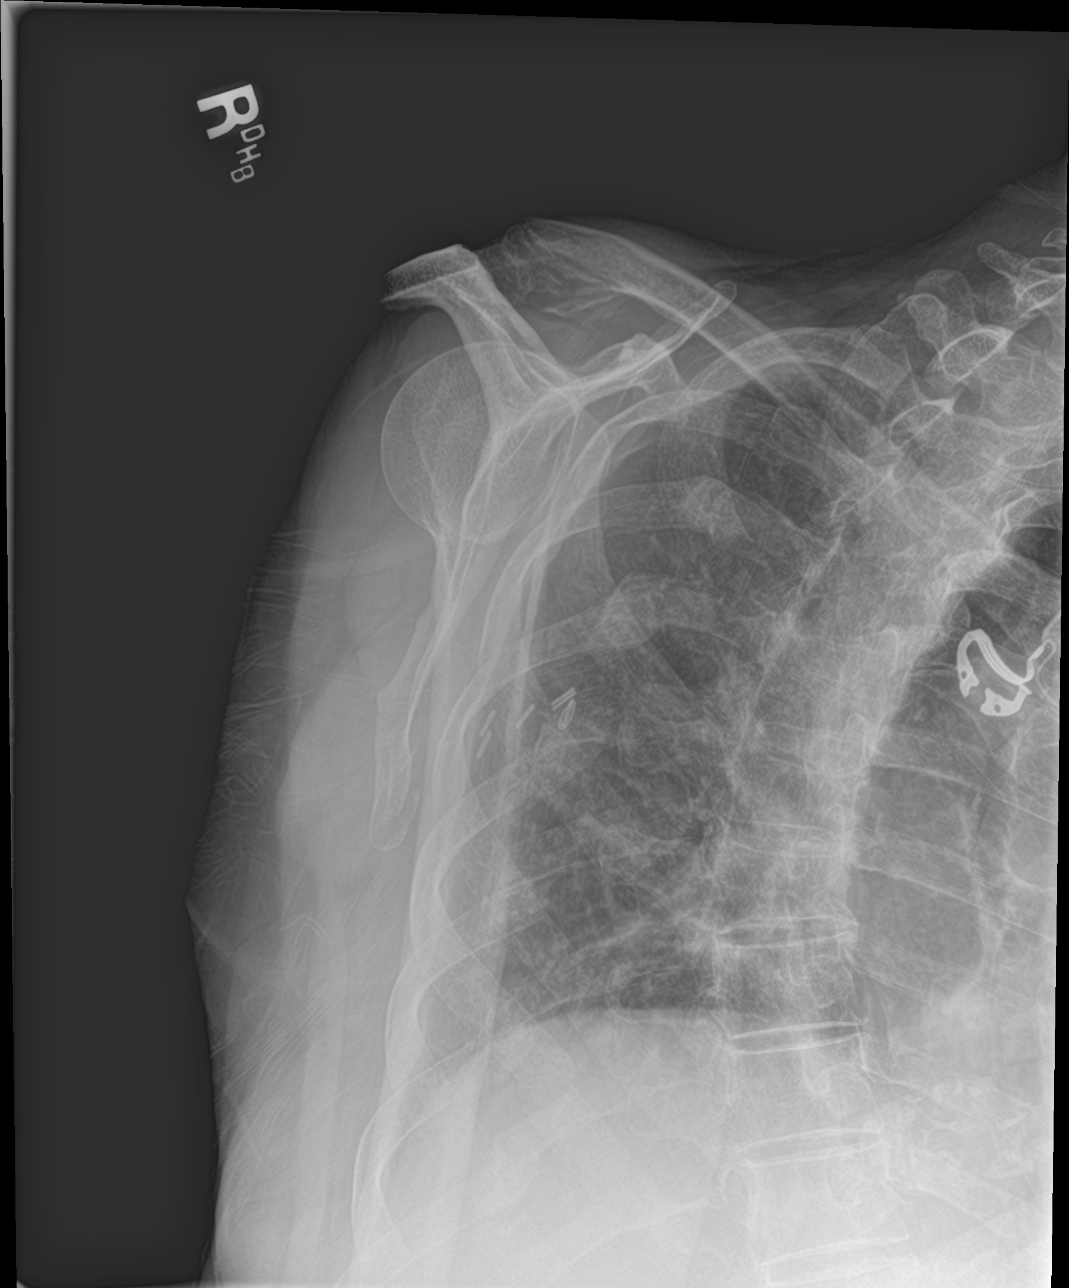
[im 3/3]
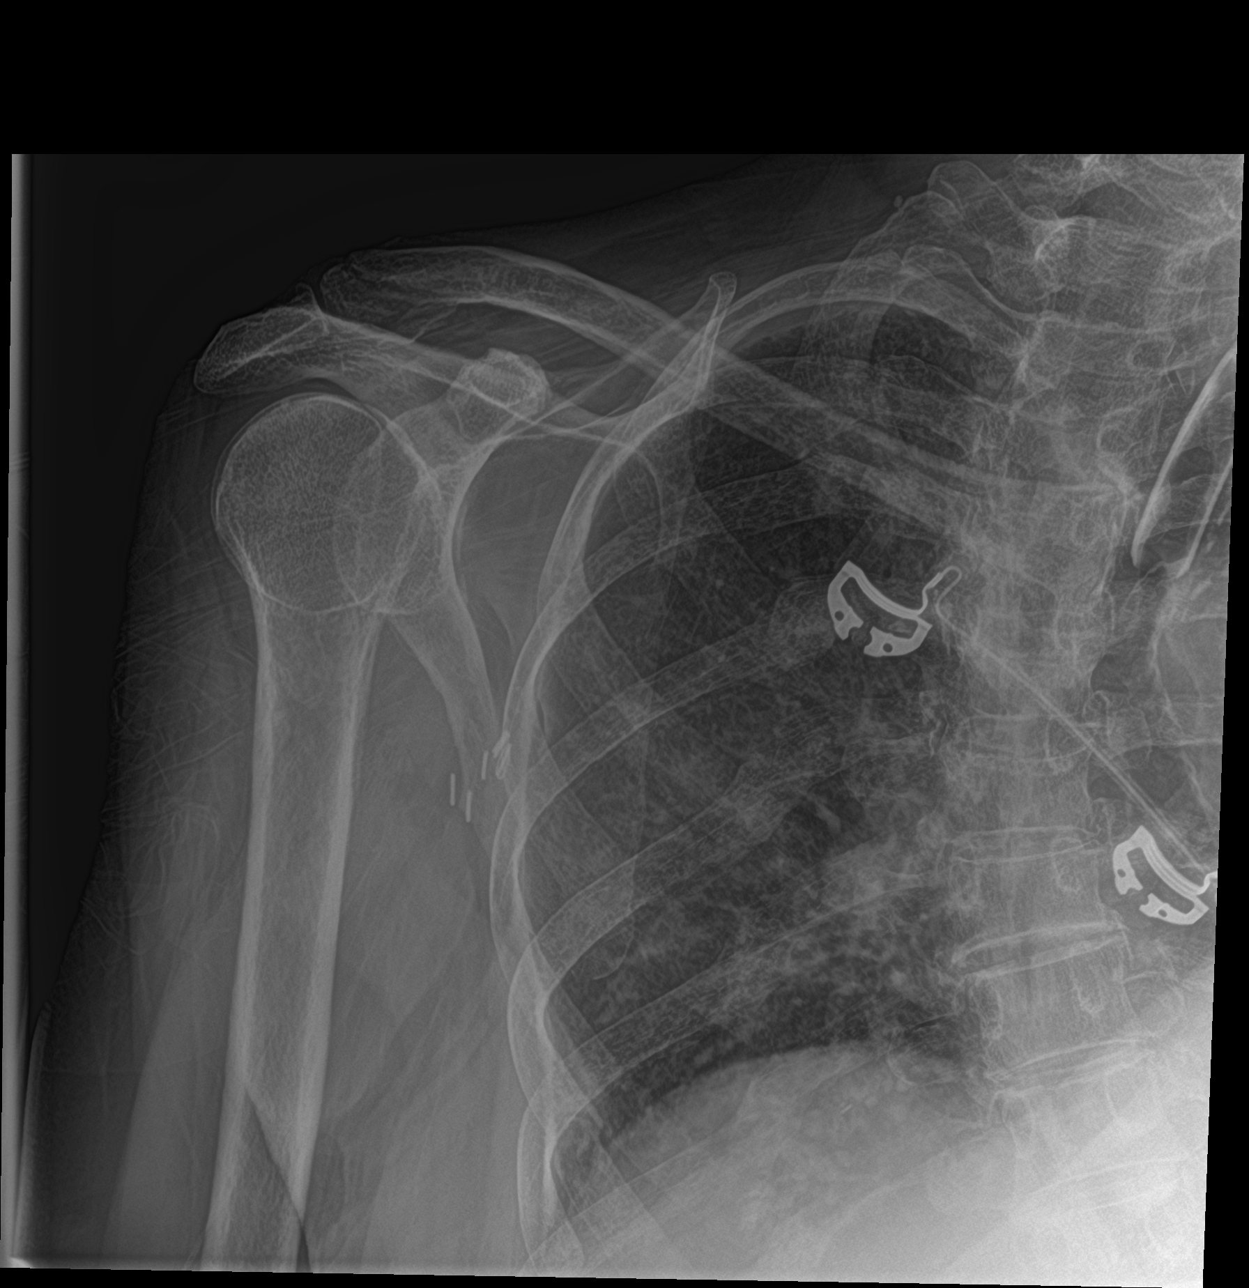

[3 of 3 positions shown; findings below may reference images not displayed]

FINDINGS: Frontal and transscapular views of the right shoulder are obtained.
Bones are osteopenic. There is a comminuted intra-articular fracture
distal margin right clavicle, with minimal distraction. No other
acute bony abnormalities. Hypertrophic changes are noted of the
acromioclavicular joint. Right chest is clear.
IMPRESSION: 1. Comminuted intra-articular fracture distal margin right clavicle,
with minimal separation of the fracture fragment.

## 2022-01-31 ENCOUNTER — Other Ambulatory Visit: Payer: Self-pay | Admitting: General Surgery

## 2022-01-31 DIAGNOSIS — K432 Incisional hernia without obstruction or gangrene: Secondary | ICD-10-CM

## 2022-02-07 IMAGING — CT CT HEAD W/O CM
3 series · 16 of 47 positions shown, 19 images · non-contrast
Comparison: Head CT 01/07/2020.  Brain MRI 03/21/2019.

CLINICAL DATA: Dizziness, nonspecific. Mental status change,
unknown cause. Additional history provided: Hallucinations for 1
week.

EXAM:
CT HEAD WITHOUT CONTRAST
TECHNIQUE: Contiguous axial images were obtained from the base of the skull
through the vertex without intravenous contrast.

[Series 3: head wo · axial · 0.42mm/px · z∈[-134,-9]mm · 10 of 30 slices shown, 13 images]
[im 3/30  brain]
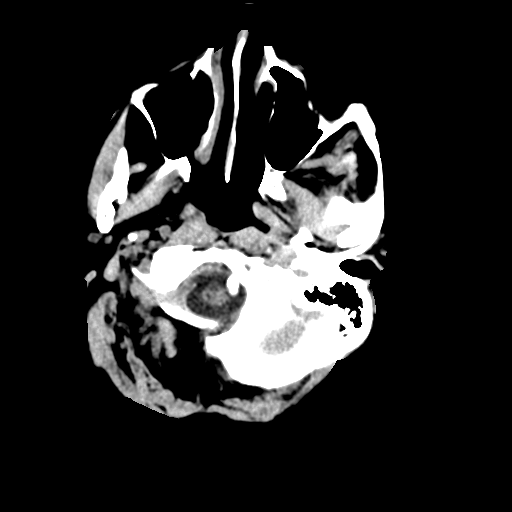
[im 3/30  bone]
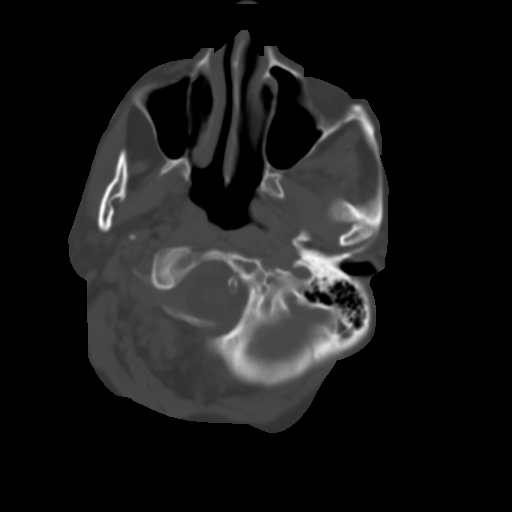
[im 6/30  brain]
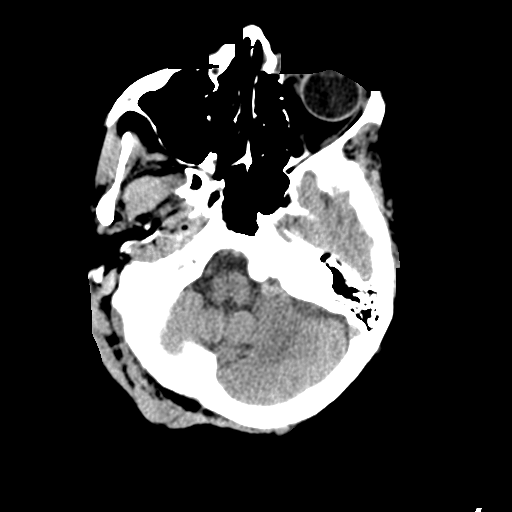
[im 9/30  brain]
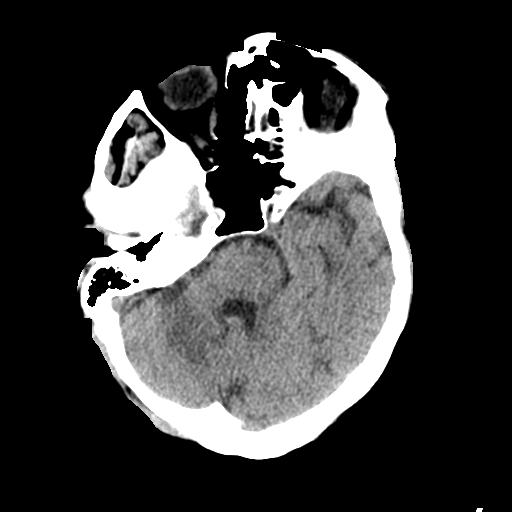
[im 11/30  brain]
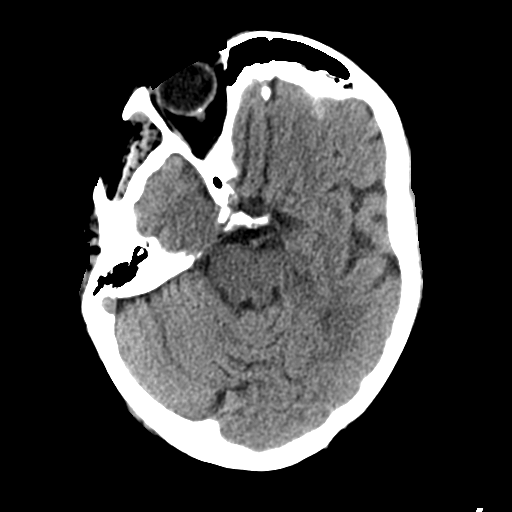
[im 14/30  brain]
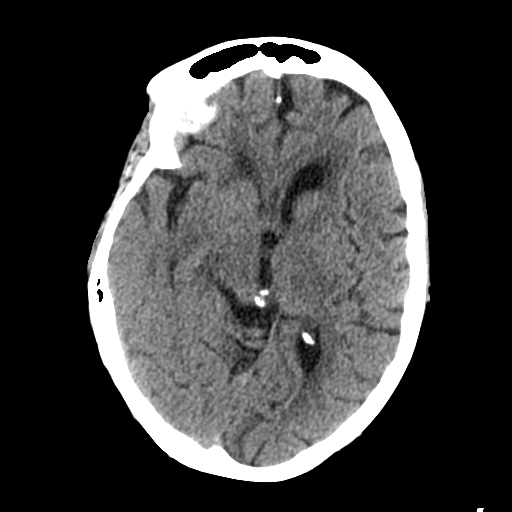
[im 14/30  bone]
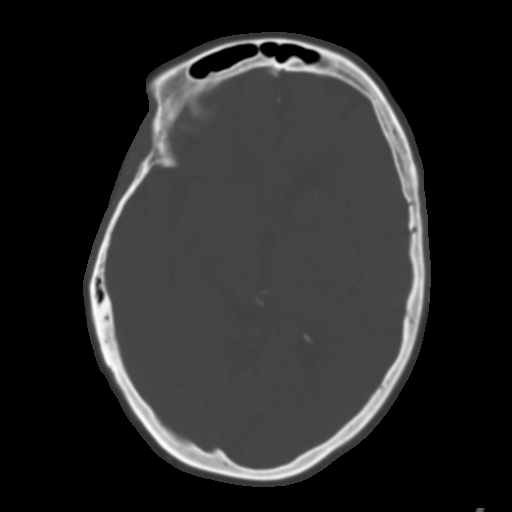
[im 17/30  brain]
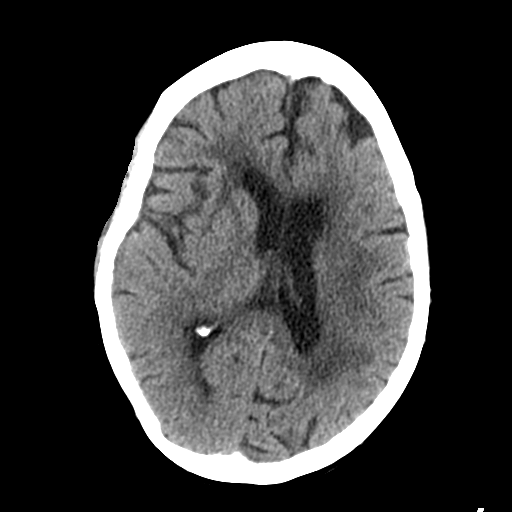
[im 20/30  brain]
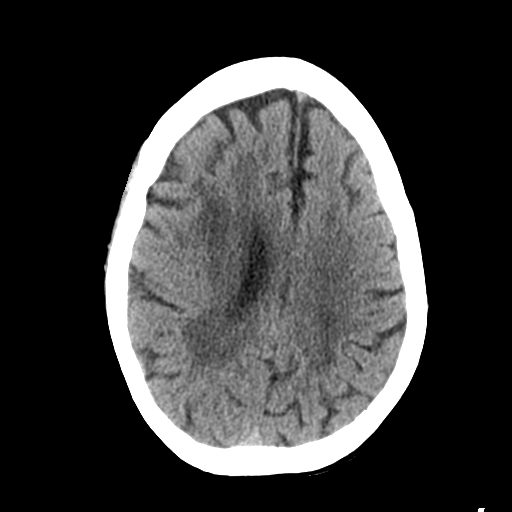
[im 23/30  brain]
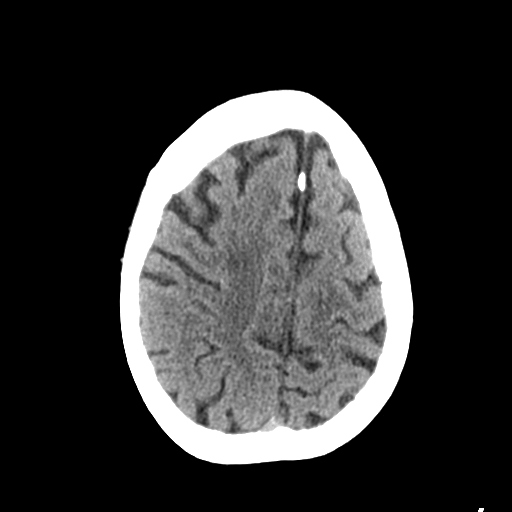
[im 25/30  brain]
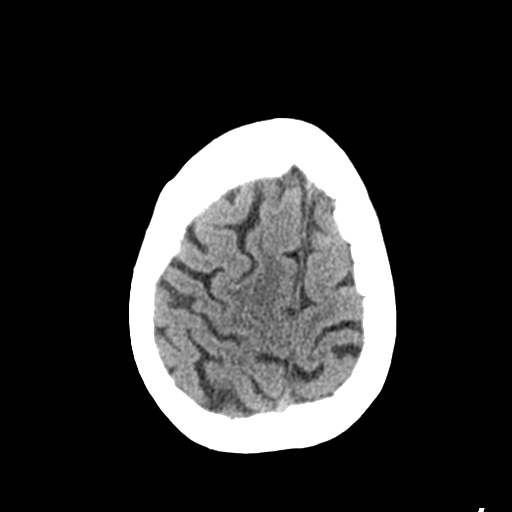
[im 25/30  bone]
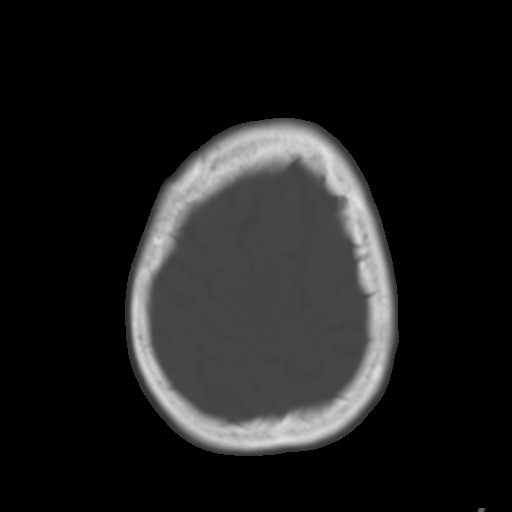
[im 28/30  brain]
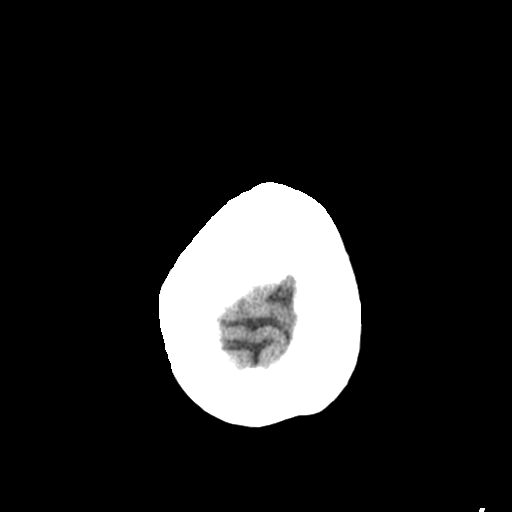

[Series 4: coronal soft tissue · coronal · 0.32mm/px · 3 of 69 slices shown]
[im 23/69  brain]
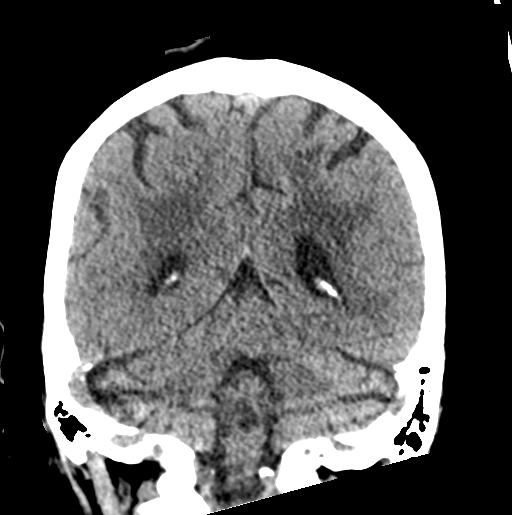
[im 31/69  brain]
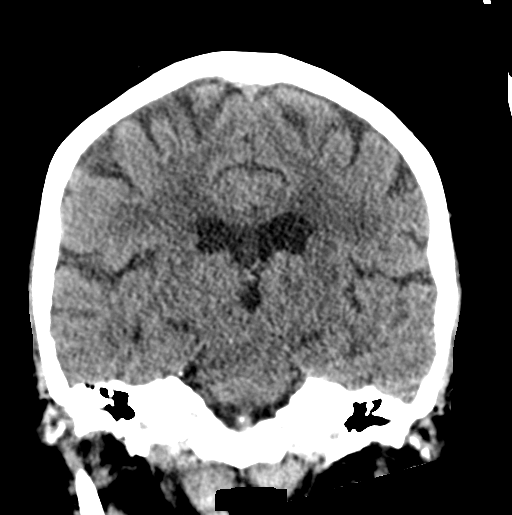
[im 38/69  brain]
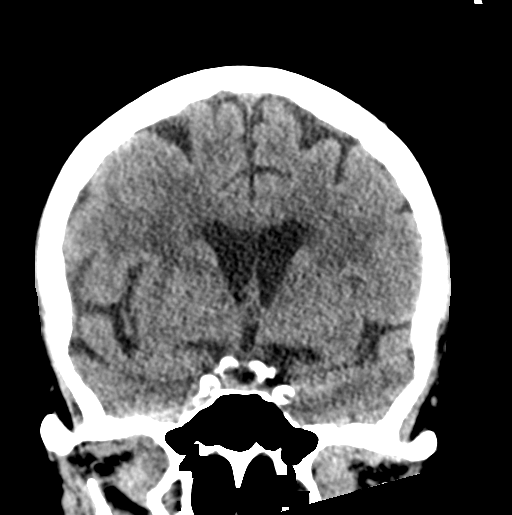

[Series 5: sagittal soft tissue · sagittal · 0.33mm/px · 3 of 56 slices shown]
[im 24/56  brain]
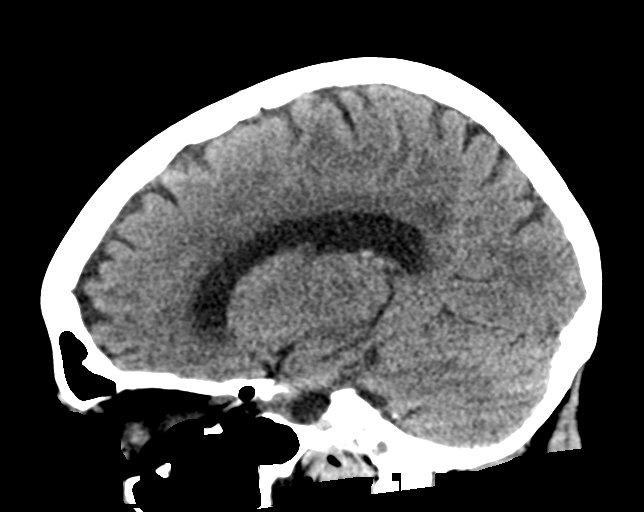
[im 28/56  brain]
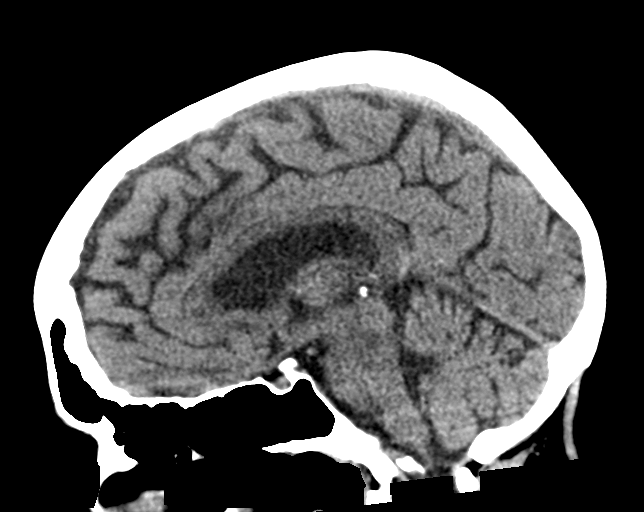
[im 32/56  brain]
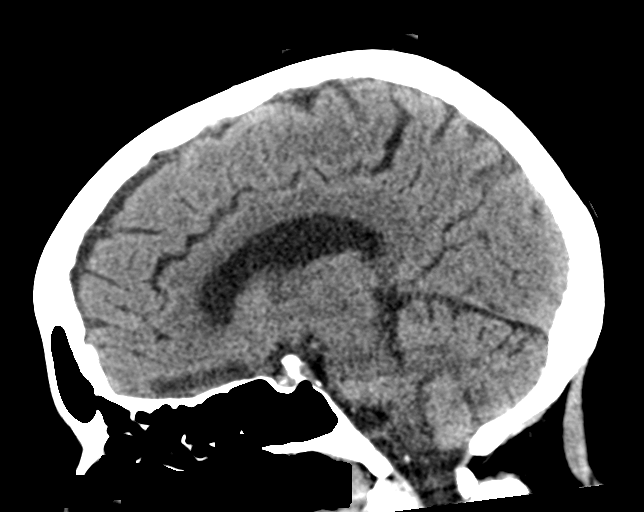

[16 of 47 positions shown; findings below may reference images not displayed]

FINDINGS: Brain:

Mild generalized cerebral atrophy.

Advanced ill-defined hypoattenuation within the cerebral white
matter is nonspecific, but compatible with chronic small vessel
ischemic disease.

There is no acute intracranial hemorrhage.

No demarcated cortical infarct.

No extra-axial fluid collection.

No evidence of intracranial mass.

No midline shift.

Vascular: No hyperdense vessel.  Atherosclerotic calcifications.

Skull: Normal. Negative for fracture or focal lesion.

Sinuses/Orbits: Visualized orbits show no acute finding. Small
volume frothy secretions within the right sphenoid sinus. Tiny right
maxillary sinus mucous retention cyst. Trace fluid within the left
mastoid air cells.
IMPRESSION: No evidence of acute intracranial abnormality.

Mild cerebral atrophy with advanced chronic small vessel ischemic
disease, stable as compared to the head CT of 01/07/2020.

Right sphenoid sinusitis.

## 2022-02-07 IMAGING — CR DG CHEST 2V
1 series · 2 of 2 positions shown · non-contrast
Comparison: July 20, 2019

CLINICAL DATA: Change in mental status

EXAM:
CHEST - 2 VIEW

[Series 1: dg chest 2 view · 0.14mm/px · 2 of 2 slices shown]
[im 1/2]
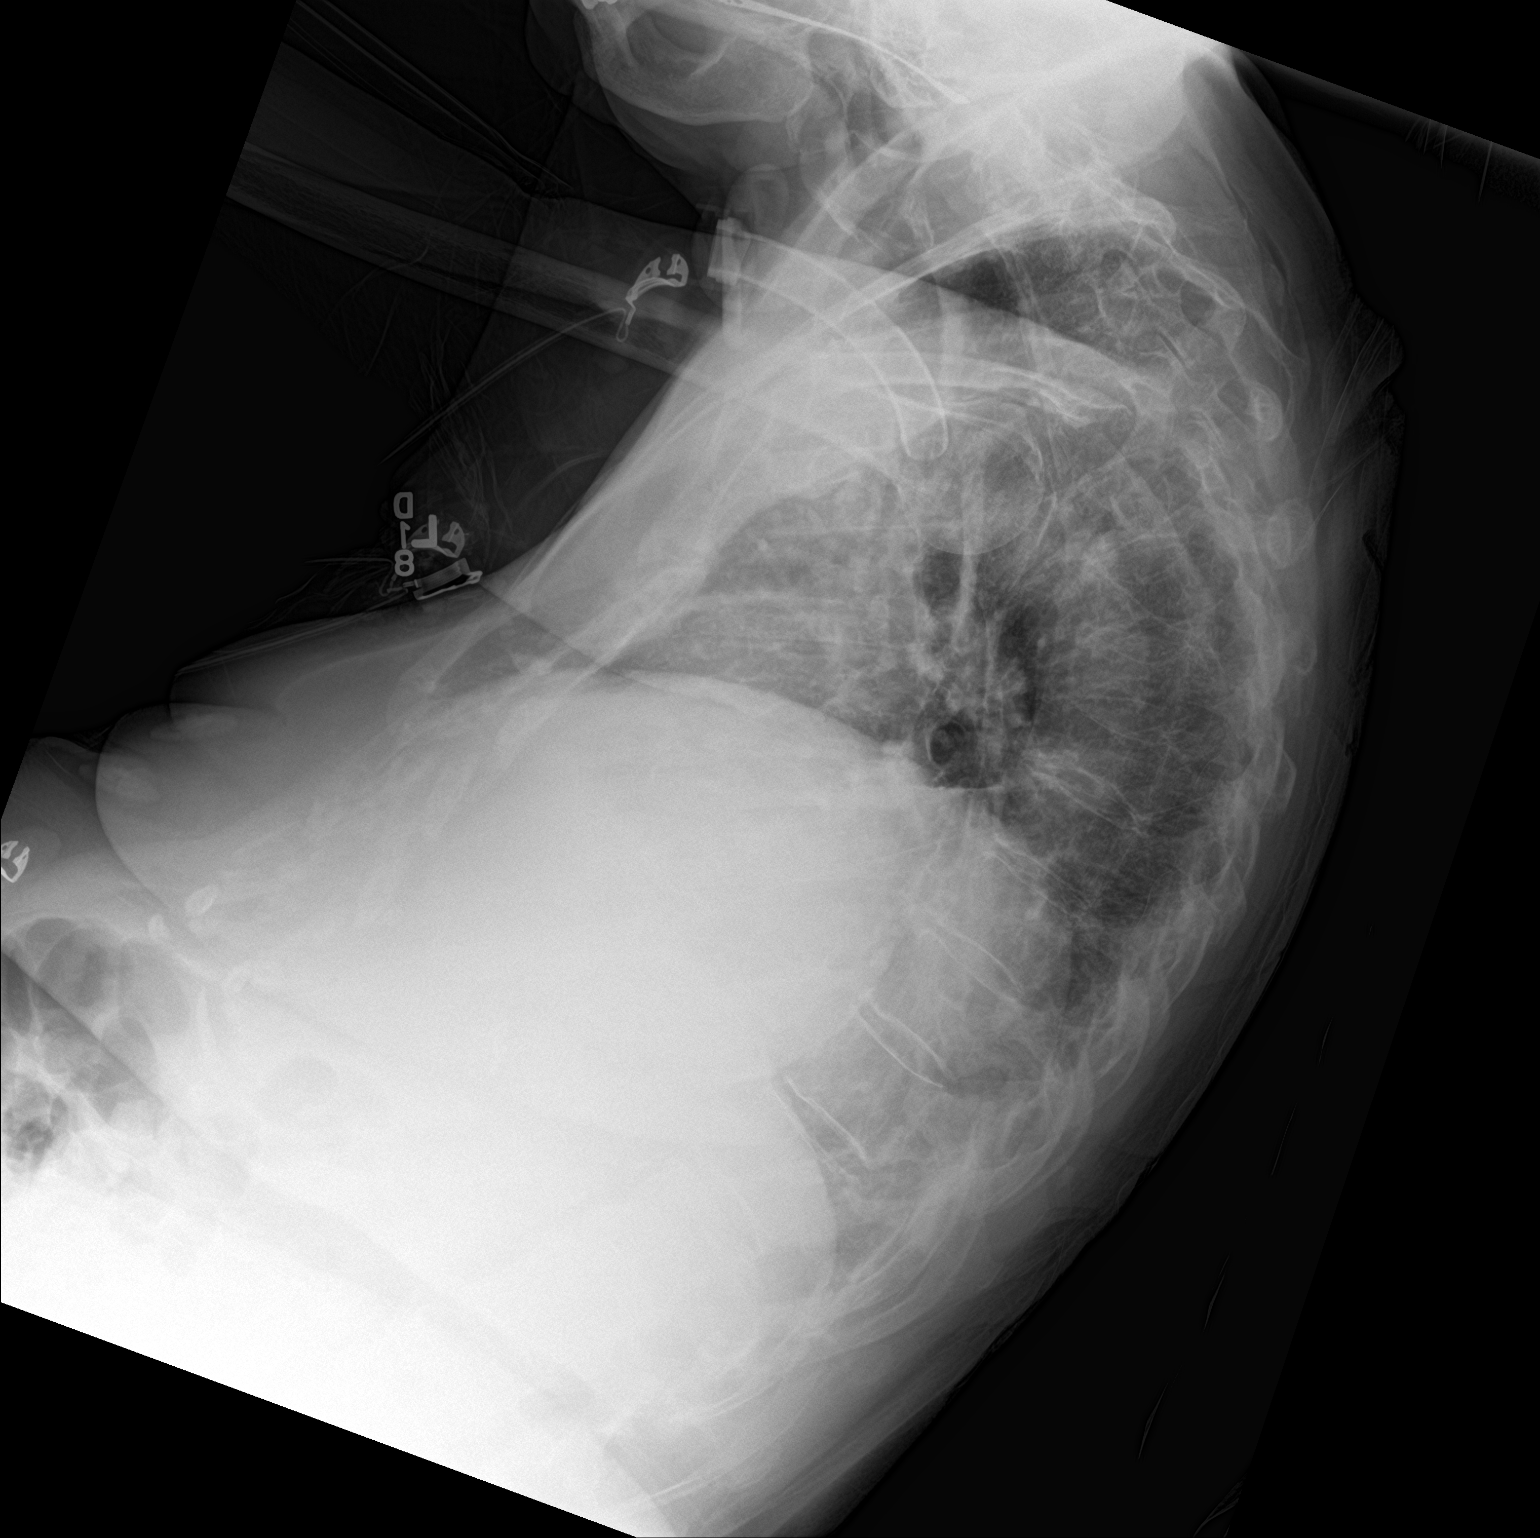
[im 2/2]
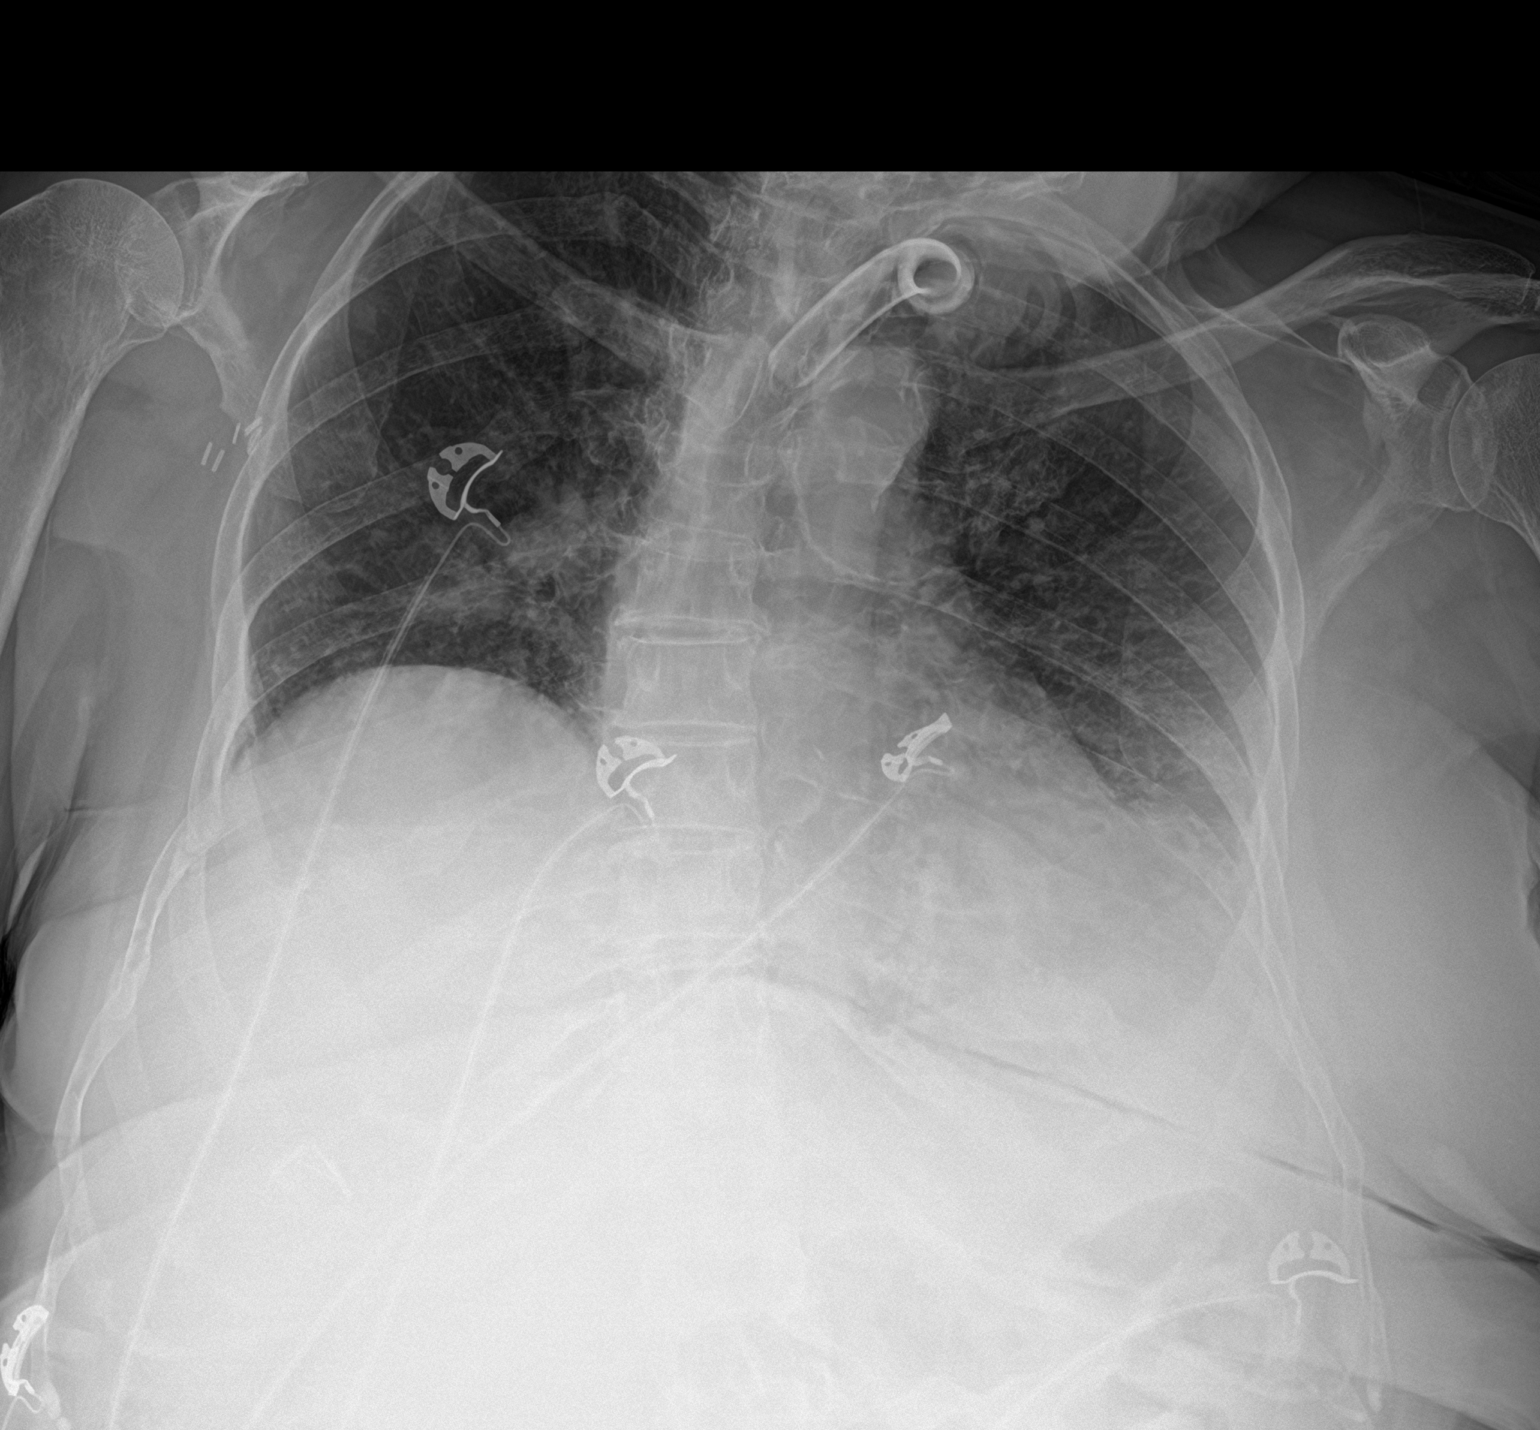

[2 of 2 positions shown; findings below may reference images not displayed]

FINDINGS: The heart size and mediastinal contours are within normal limits.
Tracheostomy tube is seen at the level of the clavicular heads.
Again noted is elevation of the right hemidiaphragm. There is
blunting of the left costophrenic angle which be due to combination
of effusion and airspace opacity.
IMPRESSION: Left pleural effusion with adjacent patchy airspace opacity which
could be due to atelectasis and/or infectious etiology.

## 2022-02-08 IMAGING — DX DG TIBIA/FIBULA 2V*L*
2 series · 2 of 2 positions shown · non-contrast
Comparison: None.

CLINICAL DATA: Initial evaluation for bilateral lower leg pain.

EXAM:
LEFT TIBIA AND FIBULA - 2 VIEW

[tibia ap]
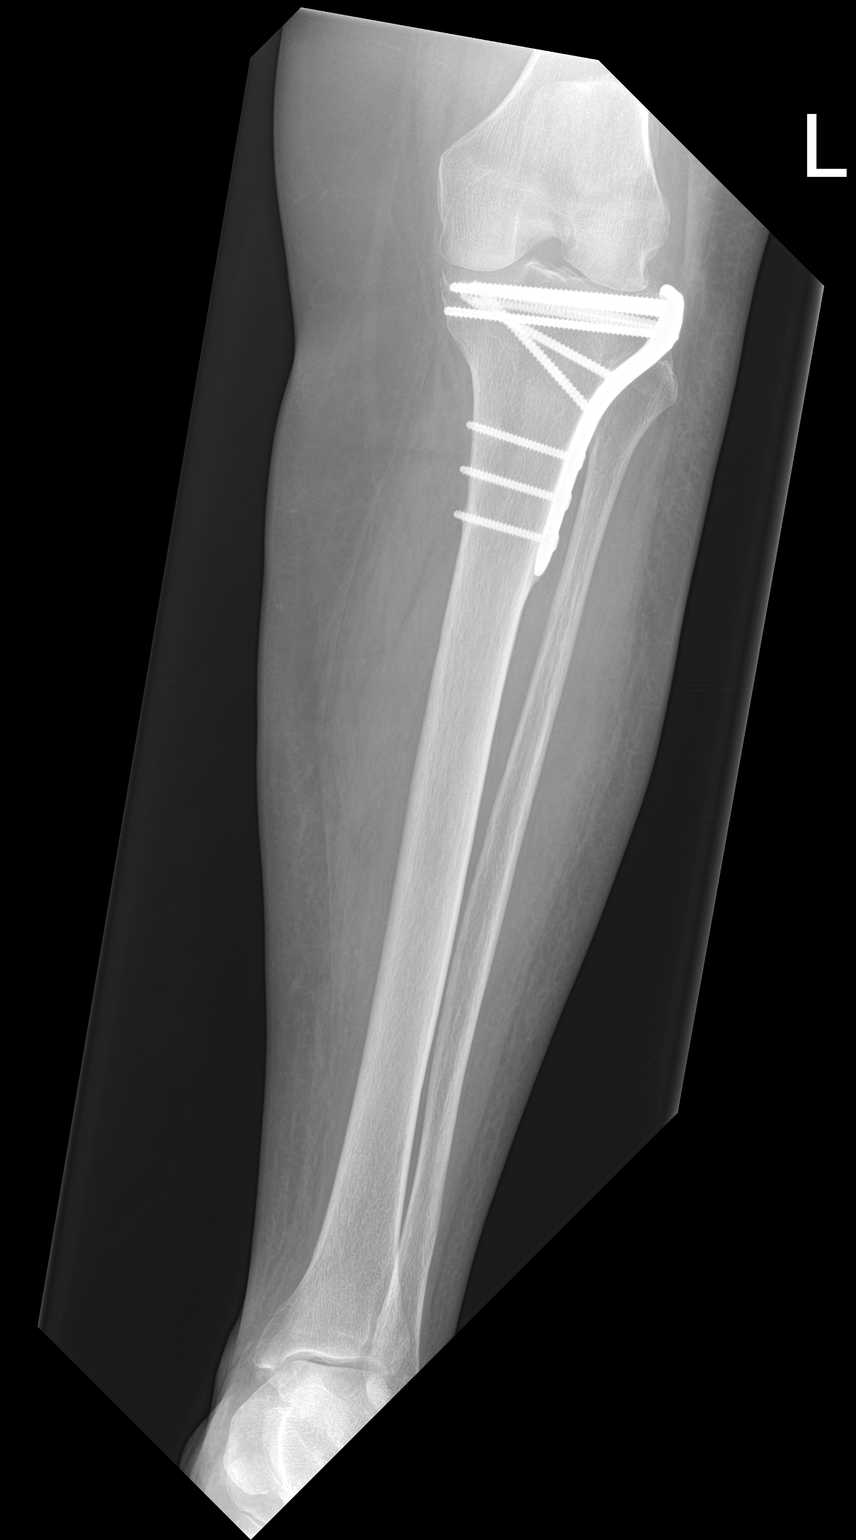

[tibia lat]
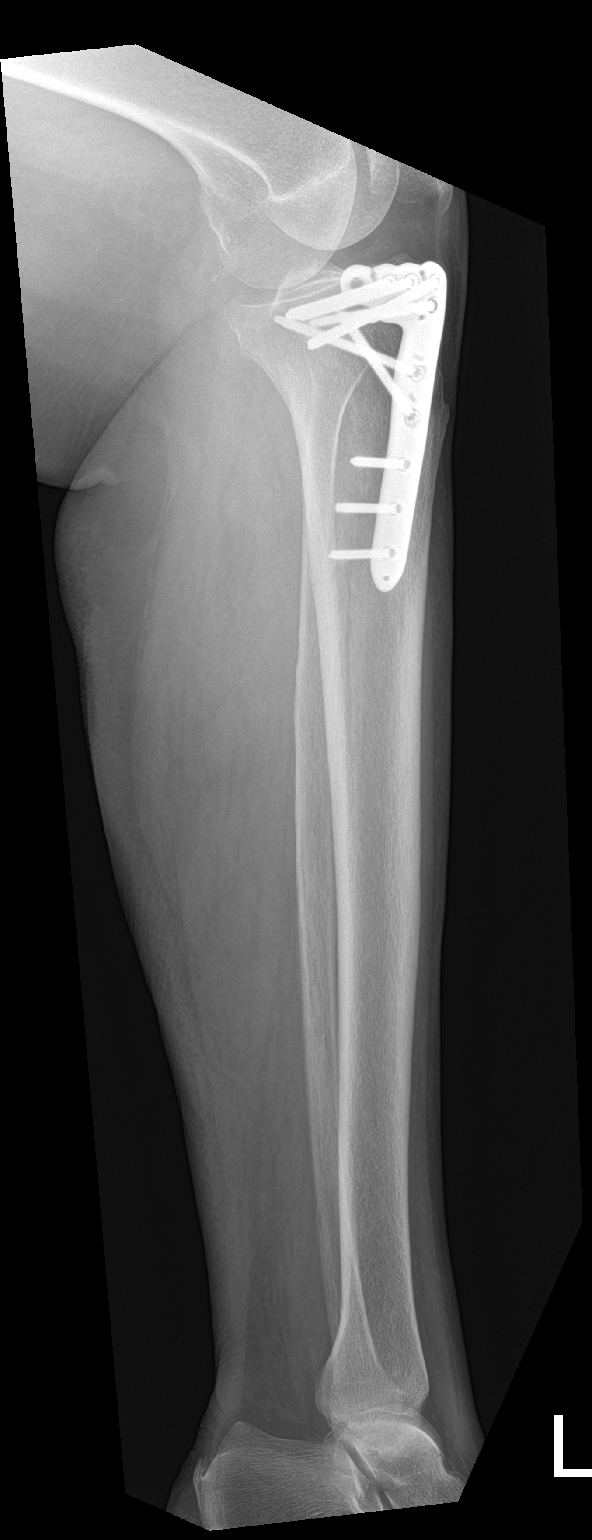

[2 of 2 positions shown; findings below may reference images not displayed]

FINDINGS: Sequelae of prior ORIF at the tibial plateau/proximal tibia with
lateral malleable plate screw fixation in place. No periprosthetic
lucency to suggest loosening or failure. No acute fracture
dislocation. No discrete osseous lesions. Osteoarthritic changes
noted about the partially visualized knee. Posterior calcaneal
enthesophyte noted. No visible soft tissue abnormality.
IMPRESSION: 1. Sequelae of prior ORIF at the left tibial plateau/proximal tibia.
No hardware complication.
2. No acute osseous abnormality about the left tibia/fibula.

## 2022-02-08 IMAGING — DX DG TIBIA/FIBULA 2V*R*
2 series · 2 of 2 positions shown · non-contrast
Comparison: None.

CLINICAL DATA: Initial evaluation for bilateral lower leg pain.

EXAM:
RIGHT TIBIA AND FIBULA - 2 VIEW

[tibia ap]
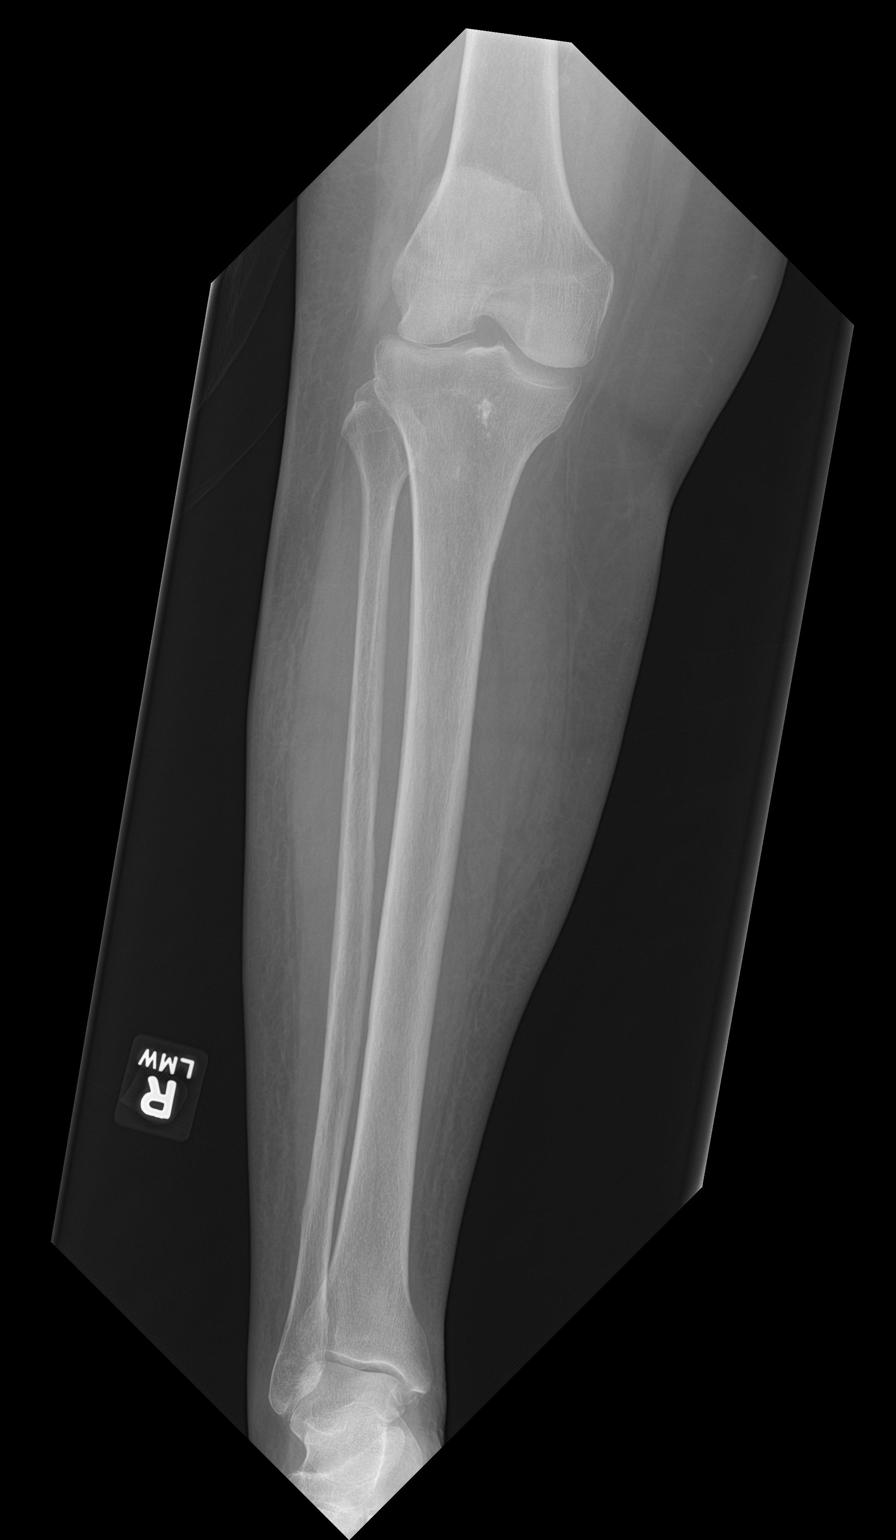

[tibia lat]
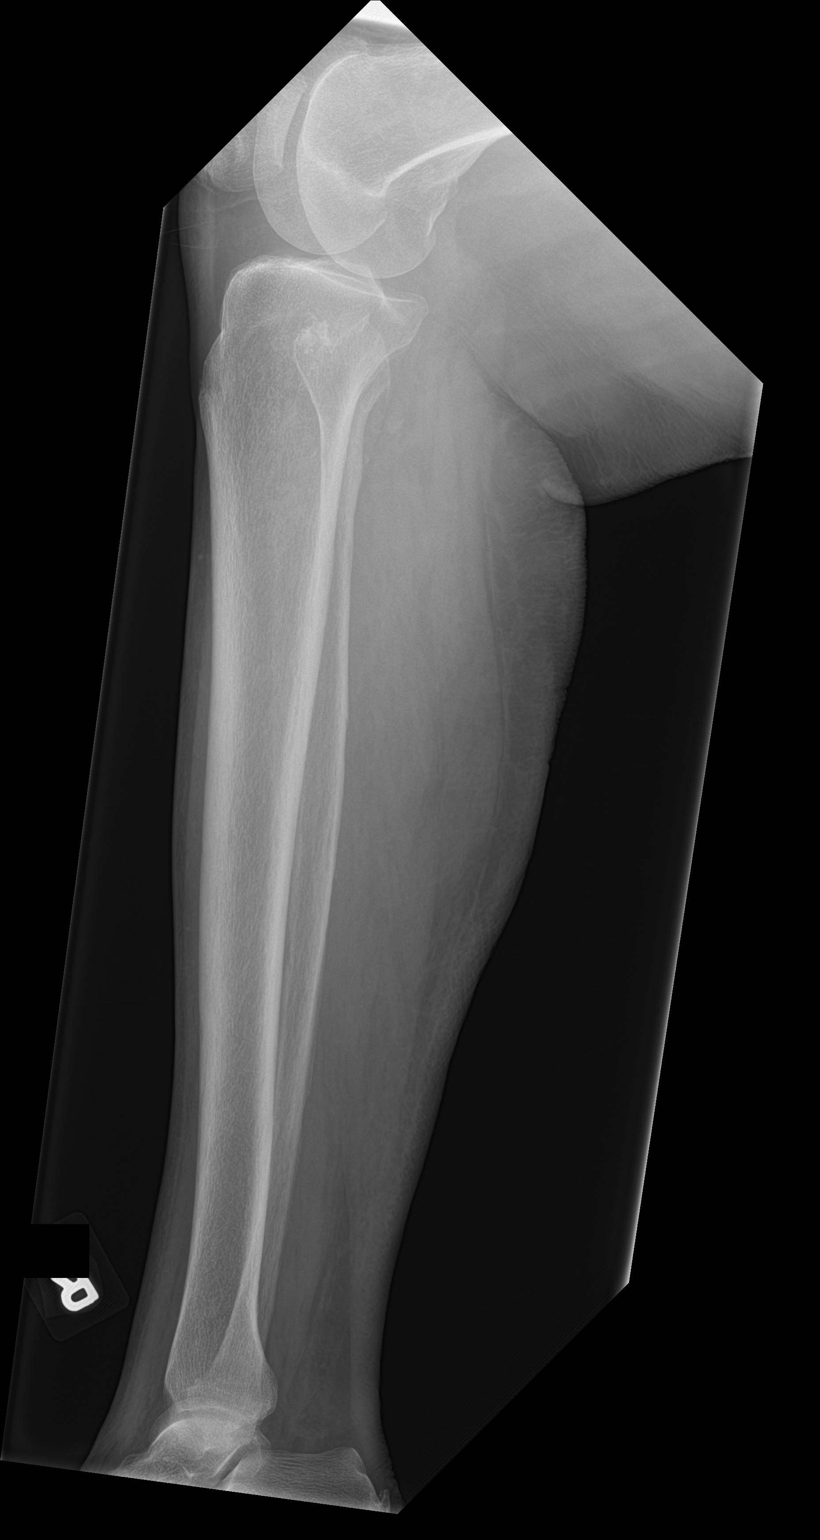

[2 of 2 positions shown; findings below may reference images not displayed]

FINDINGS: No acute fracture or dislocation. Sclerotic focus at the proximal
tibia noted, nonspecific, but most likely benign. Posterior
calcaneal enthesophyte. No visible soft tissue abnormality.
IMPRESSION: No acute osseous abnormality about the right tibia/fibula. No
visible soft tissue abnormality.

## 2022-02-10 IMAGING — DX DG CHEST 1V PORT
1 series · 1 of 1 positions shown · non-contrast
Comparison: Chest radiographs 01/15/2020 and CTA 01/17/2020

CLINICAL DATA: Cough.

EXAM:
PORTABLE CHEST 1 VIEW

[chest ap]
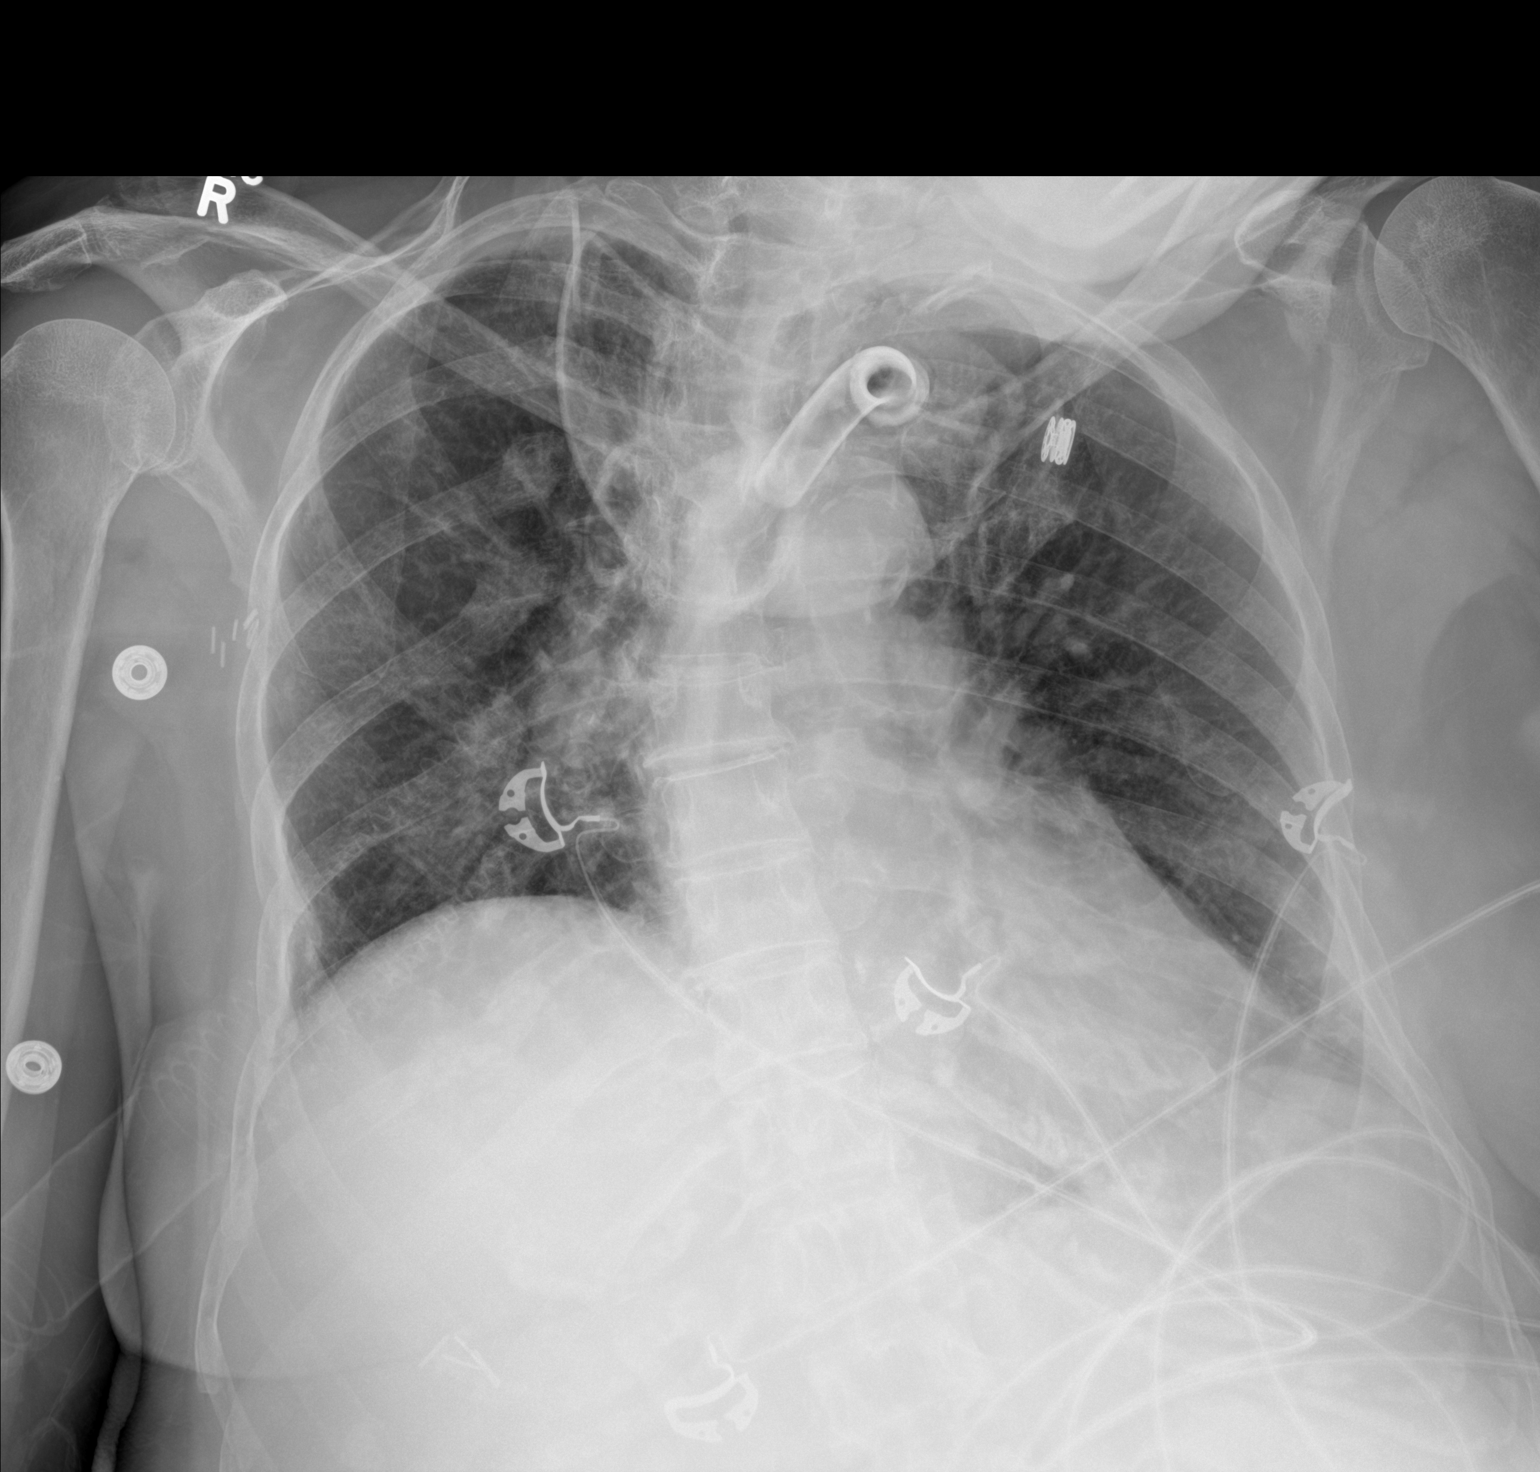

[1 of 1 positions shown; findings below may reference images not displayed]

FINDINGS: The patient is rotated to the left. Tracheostomy tube overlies the
airway. The cardiomediastinal silhouette is unchanged with normal
heart size. There is persistent elevation of the right
hemidiaphragm. Patchy left basilar airspace opacity has decreased.
No sizable pleural effusion or pneumothorax is identified. Old rib
fractures are noted.
IMPRESSION: Improved left basilar aeration.

## 2022-02-14 ENCOUNTER — Ambulatory Visit
Admission: RE | Admit: 2022-02-14 | Discharge: 2022-02-14 | Disposition: A | Discharge status: Home / Self Care | Payer: Medicare Other | Source: Ambulatory Visit | Attending: General Surgery | Admitting: General Surgery

## 2022-02-14 DIAGNOSIS — K432 Incisional hernia without obstruction or gangrene: Secondary | ICD-10-CM | POA: Insufficient documentation

## 2022-02-15 IMAGING — CT CT HEAD W/O CM
3 of 4 series · 16 of 47 positions shown, 19 images · non-contrast
Comparison: January 15, 2020

CLINICAL DATA: Altered mental status.

EXAM:
CT HEAD WITHOUT CONTRAST
TECHNIQUE: Contiguous axial images were obtained from the base of the skull
through the vertex without intravenous contrast.

[Series 3: head wo · axial · 0.42mm/px · z∈[-105,+30]mm · 10 of 33 slices shown, 13 images]
[im 3/33  brain]
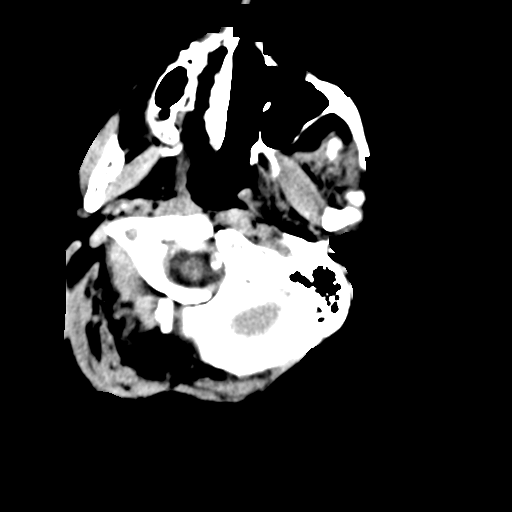
[im 3/33  bone]
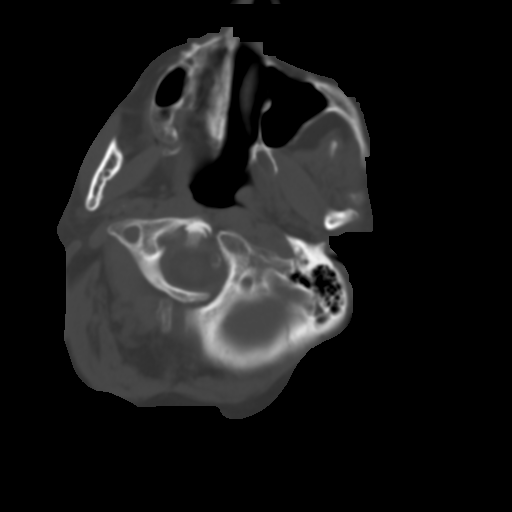
[im 5/33  brain]
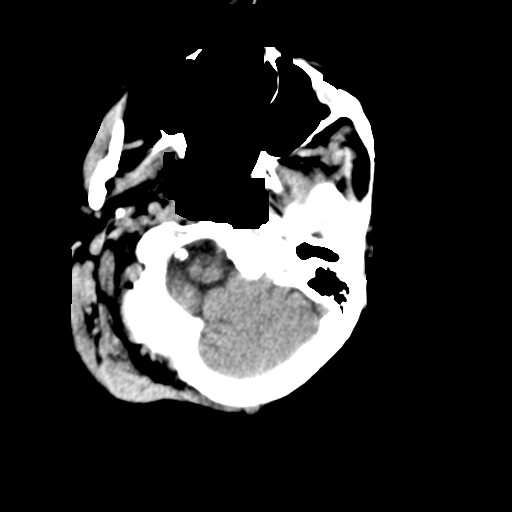
[im 10/33  brain]
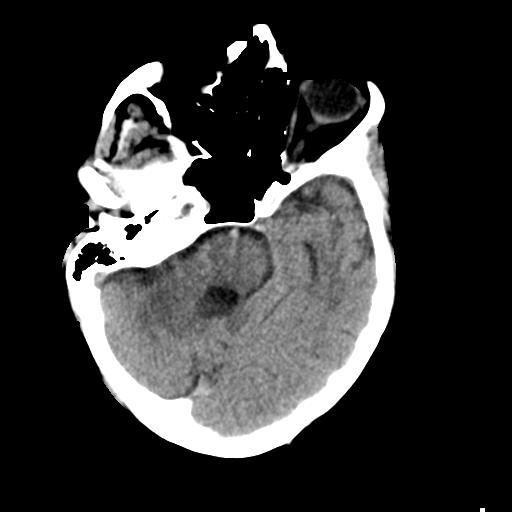
[im 12/33  brain]
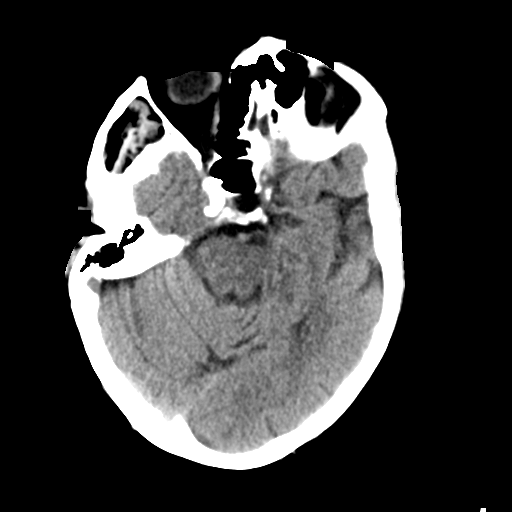
[im 14/33  brain]
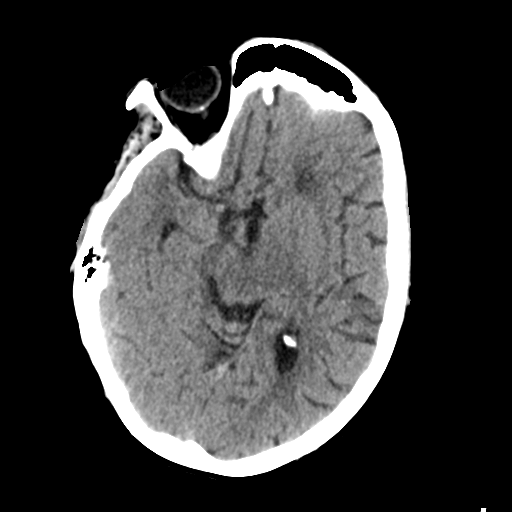
[im 14/33  bone]
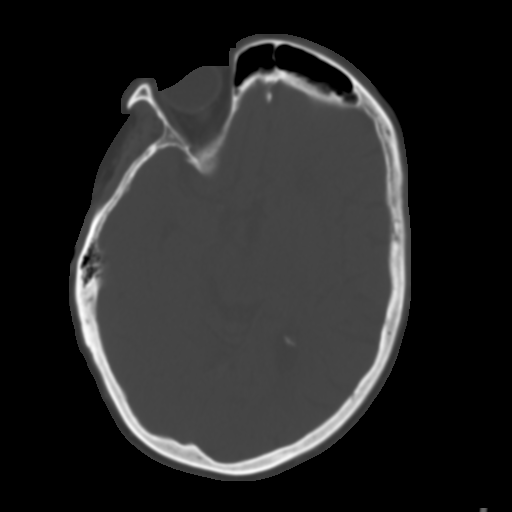
[im 19/33  brain]
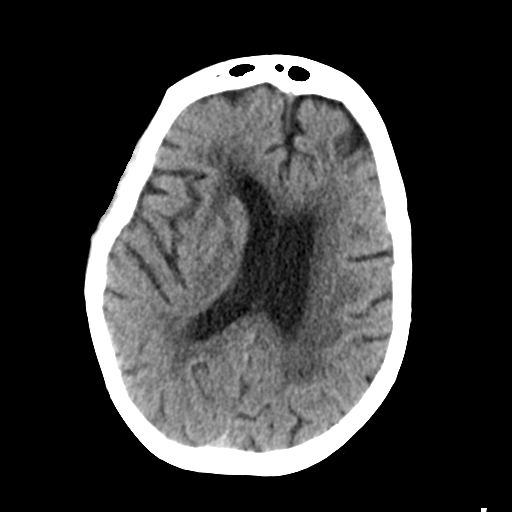
[im 21/33  brain]
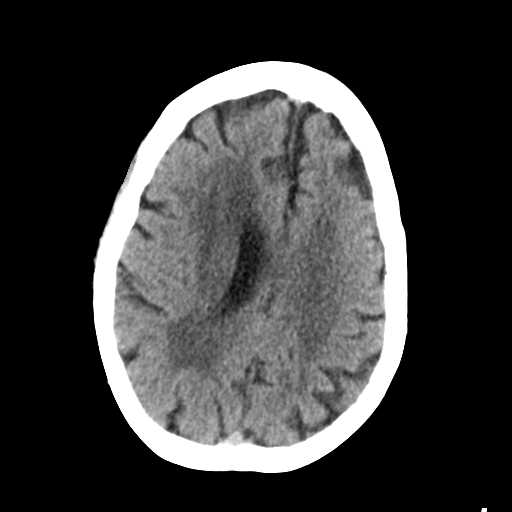
[im 23/33  brain]
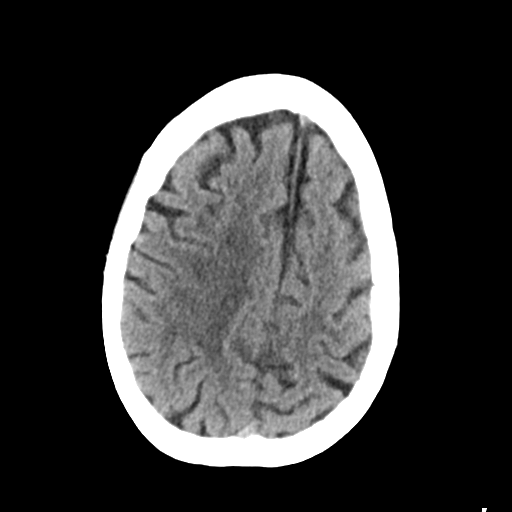
[im 28/33  brain]
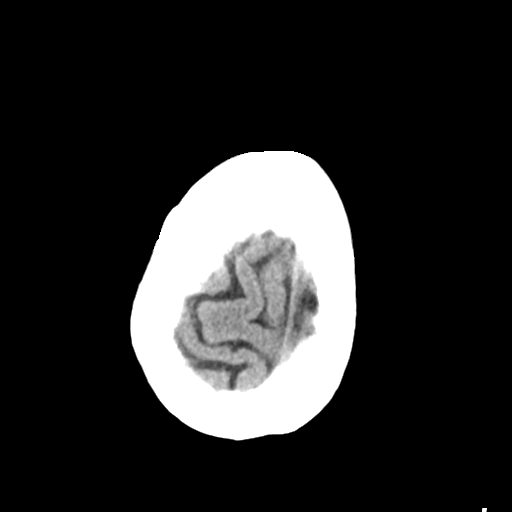
[im 28/33  bone]
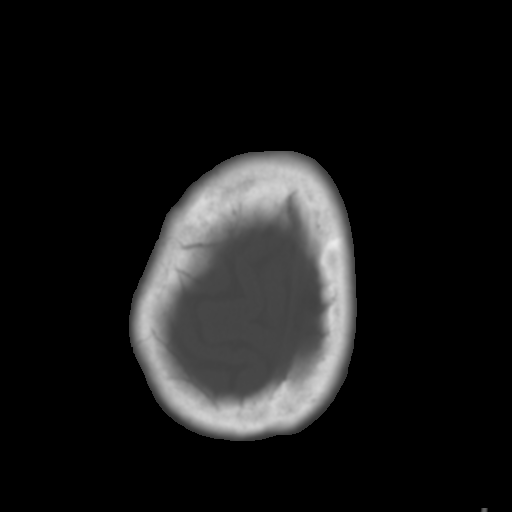
[im 30/33  brain]
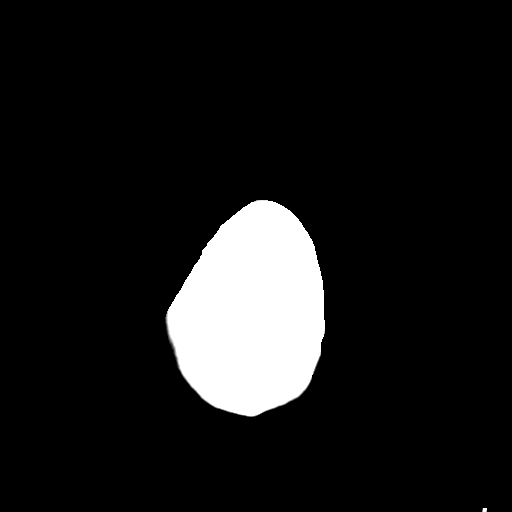

[Series 4: coronal soft tissue · coronal · 0.27mm/px · 3 of 65 slices shown]
[im 24/65  brain]
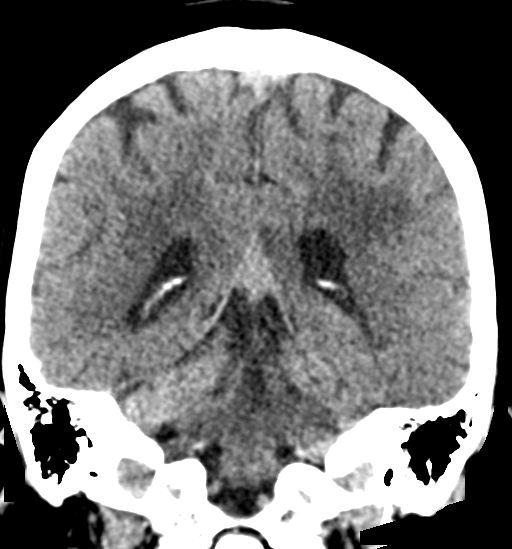
[im 30/65  brain]
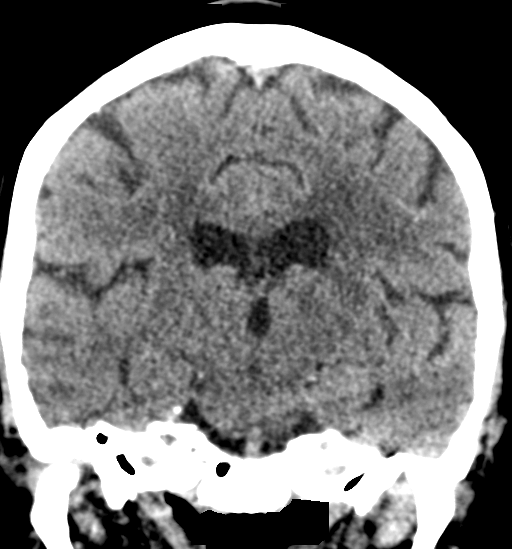
[im 35/65  brain]
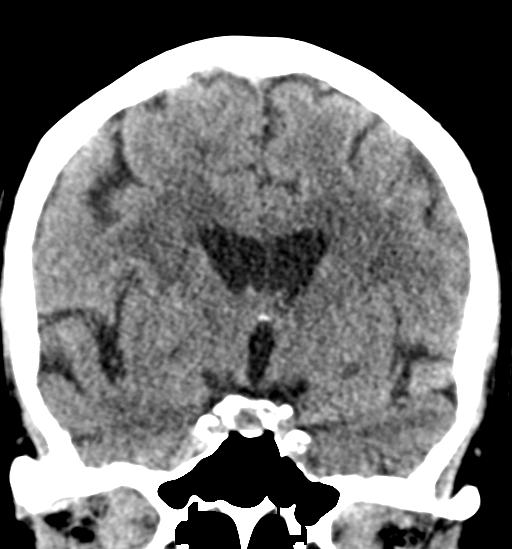

[Series 5: sagittal soft tissue · sagittal · 0.29mm/px · 3 of 47 slices shown]
[im 20/47  brain]
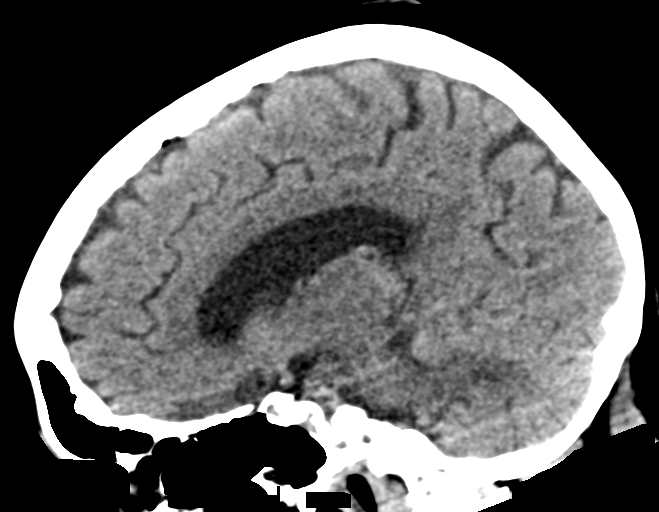
[im 24/47  brain]
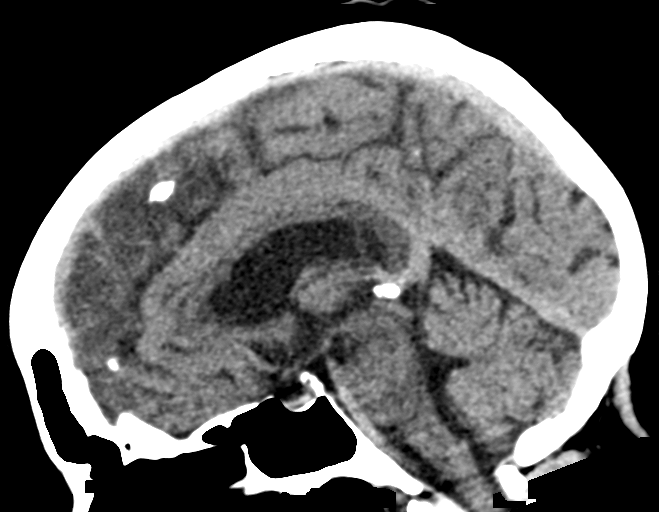
[im 27/47  brain]
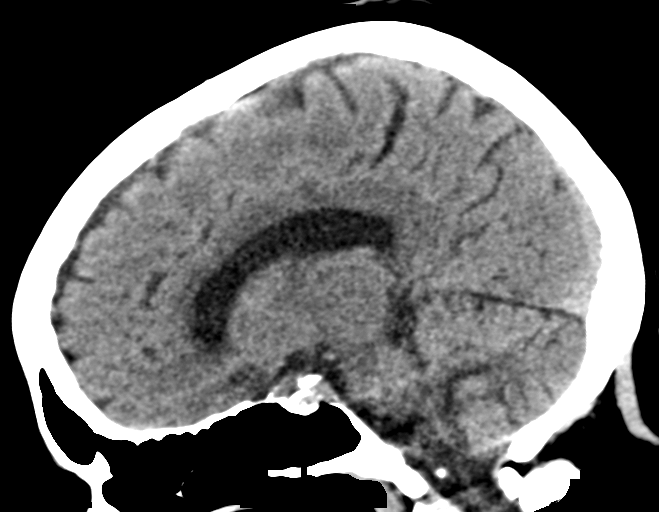

[16 of 47 positions shown; findings below may reference images not displayed]

FINDINGS: Brain: There is mild cerebral atrophy with widening of the
extra-axial spaces and ventricular dilatation.
There are areas of decreased attenuation within the white matter
tracts of the supratentorial brain, consistent with microvascular
disease changes.

Vascular: No hyperdense vessel or unexpected calcification.

Skull: Normal. Negative for fracture or focal lesion.

Sinuses/Orbits: No acute finding.

Other: None.
IMPRESSION: 1. Generalized cerebral atrophy.
2. No acute intracranial abnormality.

## 2022-02-15 IMAGING — DX DG CHEST 1V PORT
1 series · 1 of 1 positions shown · non-contrast
Comparison: January 18, 2020

CLINICAL DATA: Sepsis.

EXAM:
PORTABLE CHEST 1 VIEW

[chest ap]
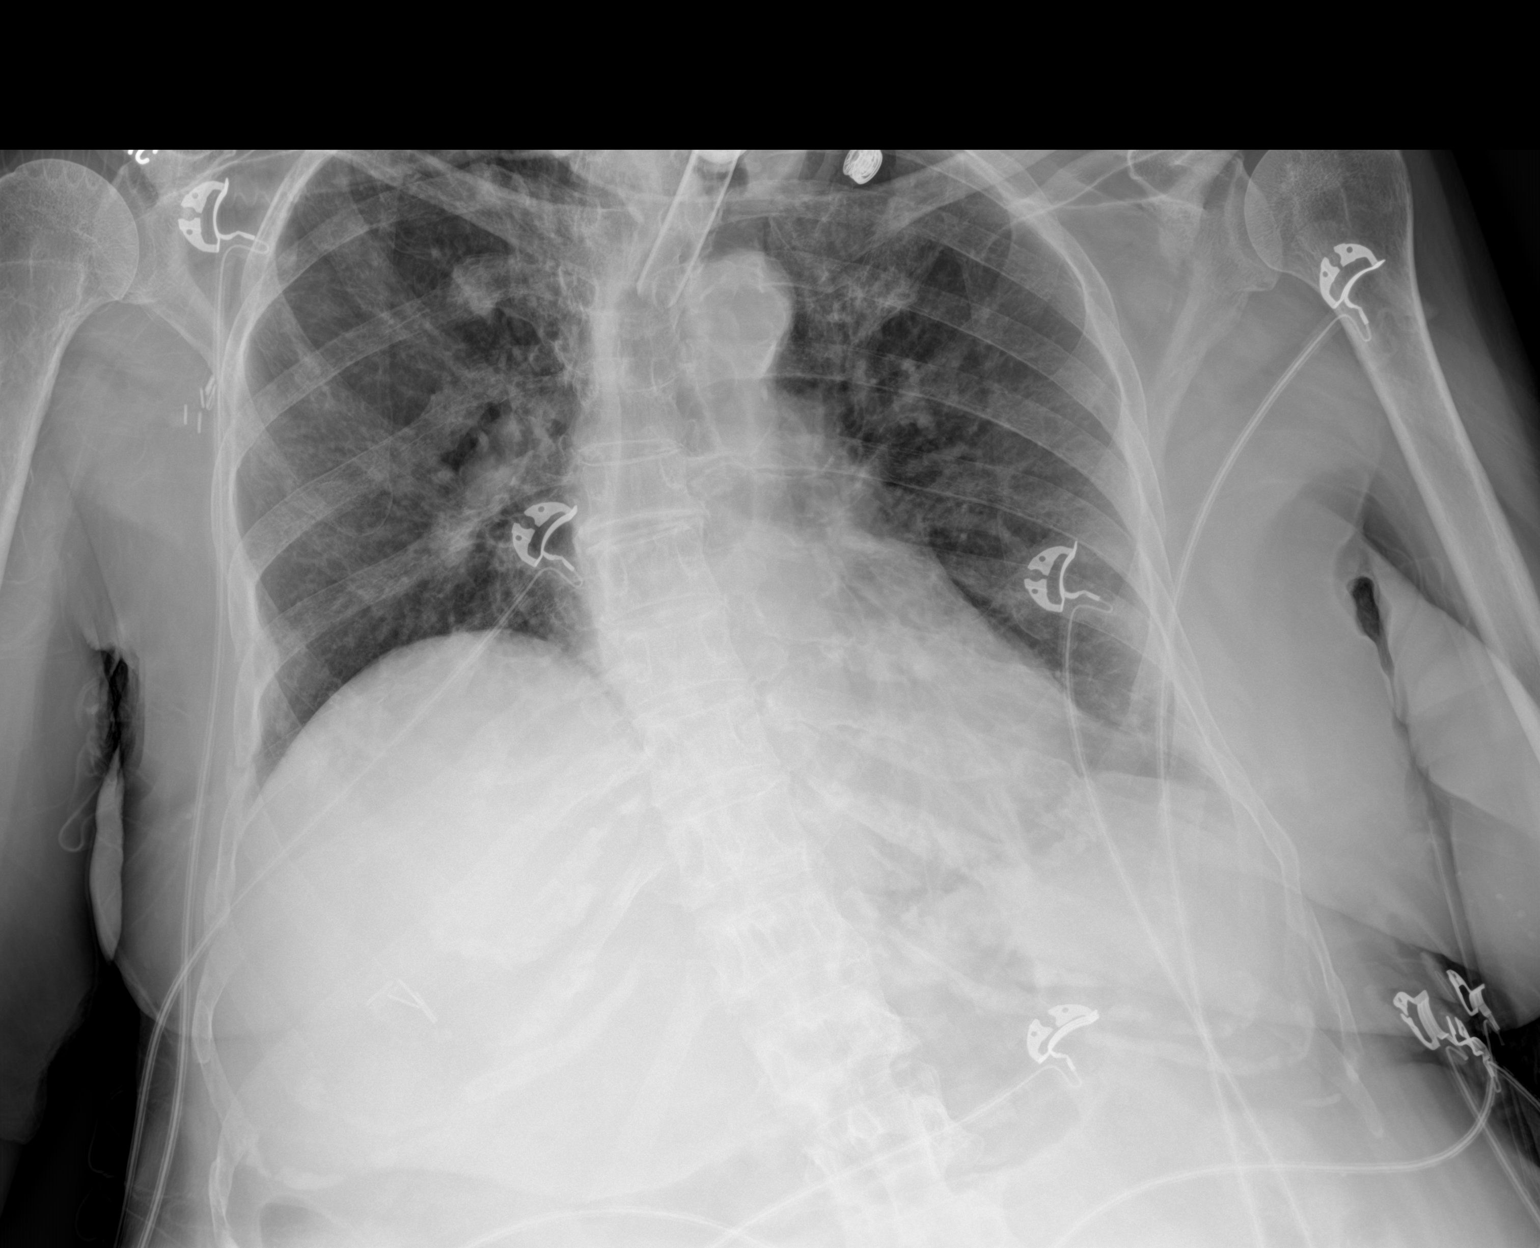

[1 of 1 positions shown; findings below may reference images not displayed]

FINDINGS: The tracheostomy tube is stable in positioning. The heart size is
unchanged. Hazy coarse lung markings are again noted. Areas of
fibrosis and scarring are noted at the lung apices. There is
elevation of the right hemidiaphragm. Aortic calcifications are
noted.
IMPRESSION: No significant interval change.

## 2022-02-16 IMAGING — DX DG ABDOMEN 1V
1 series · 1 of 1 positions shown · non-contrast
Comparison: 11/19/2010

CLINICAL DATA: OG tube placement

EXAM:
ABDOMEN - 1 VIEW

[abdomen supine]
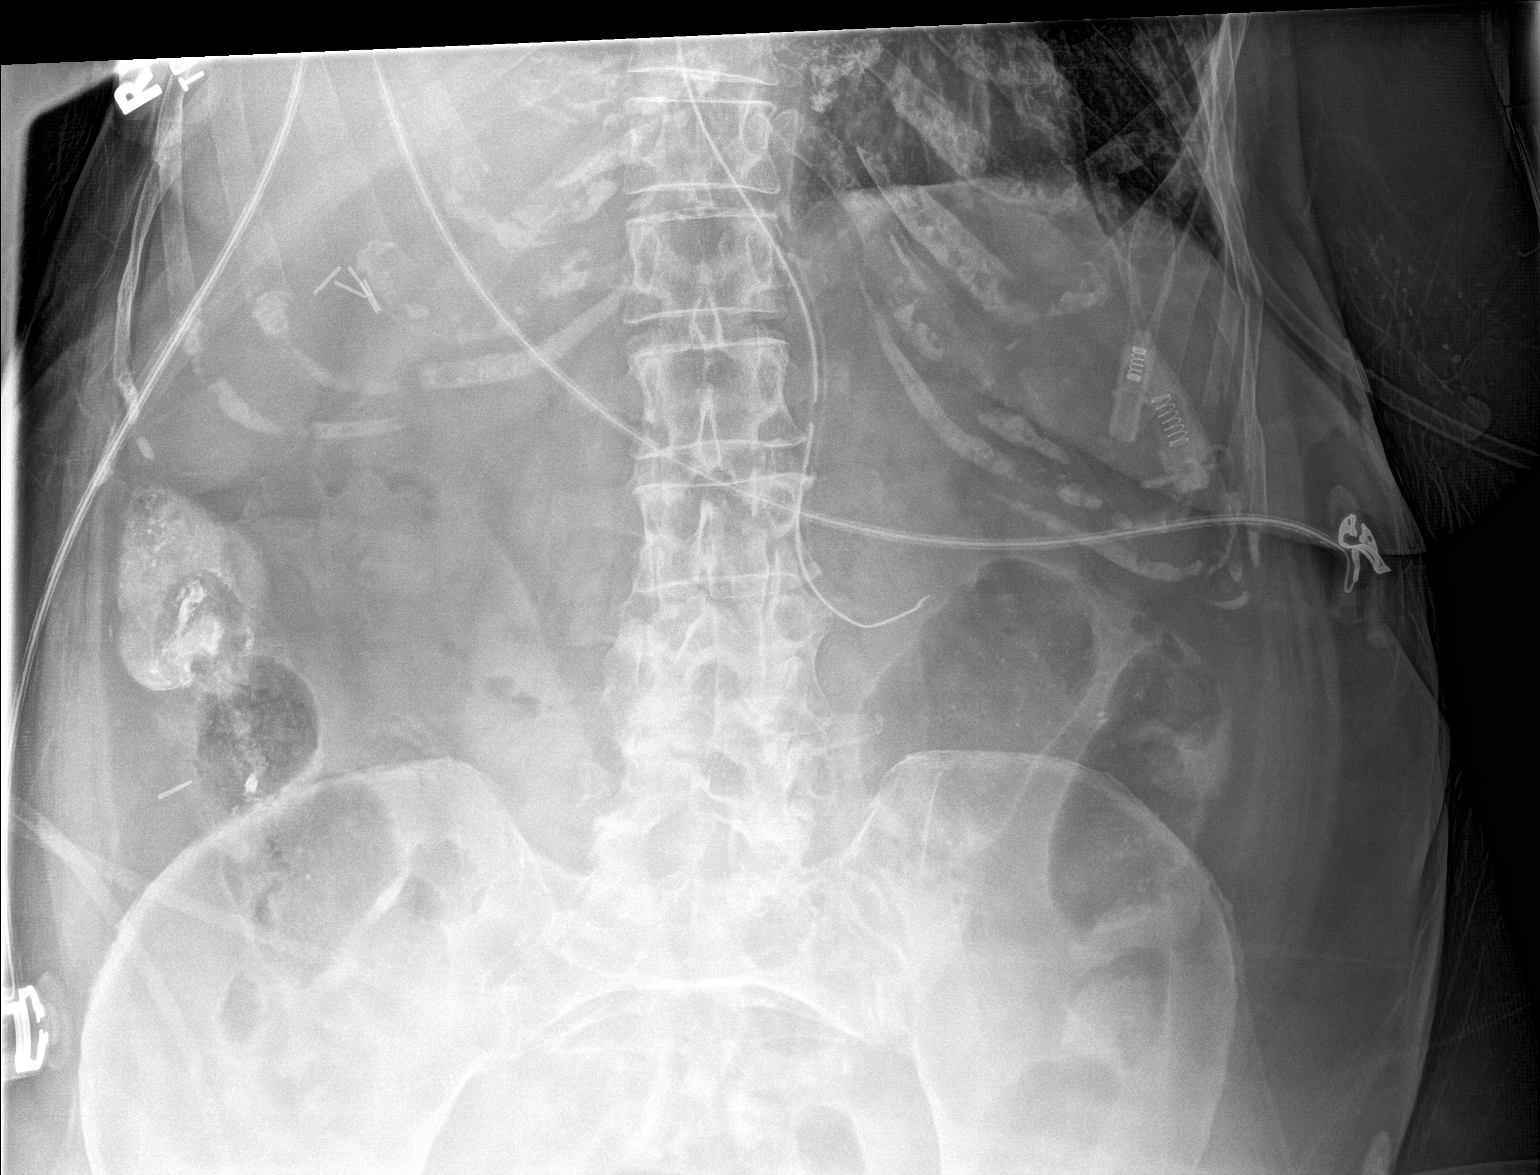

[1 of 1 positions shown; findings below may reference images not displayed]

FINDINGS: The OG tube projects over the gastric body. The bowel gas pattern is
nonobstructive.
IMPRESSION: OG tube projects over the gastric body.

## 2022-02-17 IMAGING — DX DG CHEST 1V PORT
1 series · 1 of 1 positions shown · non-contrast
Comparison: 01/23/2020

CLINICAL DATA: Altered mental status, shortness of breath

EXAM:
PORTABLE CHEST 1 VIEW

[chest ap]
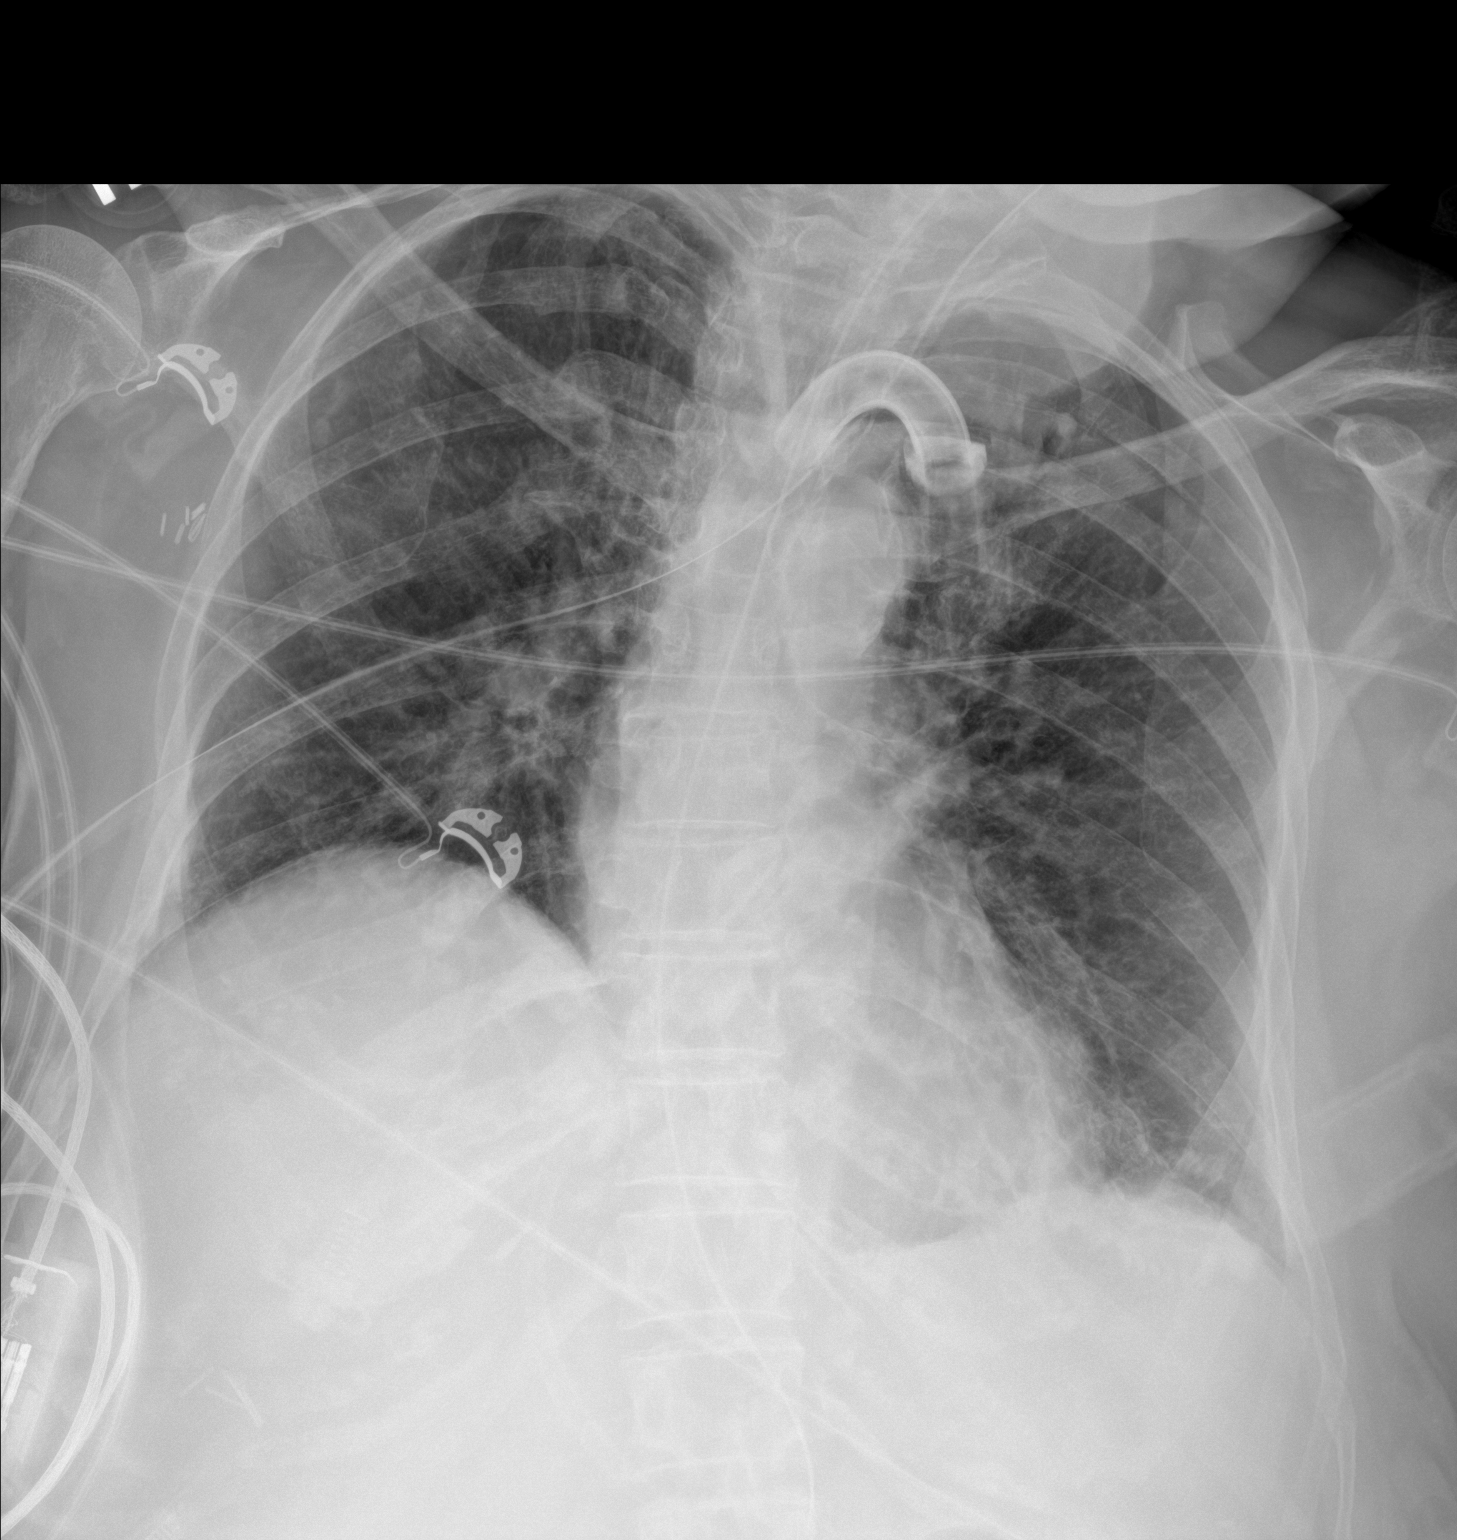

[1 of 1 positions shown; findings below may reference images not displayed]

FINDINGS: Tracheostomy device is present. Enteric tube passes into the stomach
with tip out of field of view.

Patient is rotated. Stable elevation of the right hemidiaphragm.
Likely chronic mild interstitial prominence. Persistent patchy
density at the left lung base no significant pleural effusion. No
pneumothorax. Stable cardiomediastinal contours.
IMPRESSION: Persistent patchy atelectasis/consolidation at the left lung base.

## 2022-02-18 IMAGING — DX DG ABD PORTABLE 1V
1 series · 1 of 1 positions shown · non-contrast
Comparison: 01/24/2020 abdominal radiographs and prior.

CLINICAL DATA: NG tube placement

EXAM:
PORTABLE ABDOMEN - 1 VIEW

[abdomen supine]
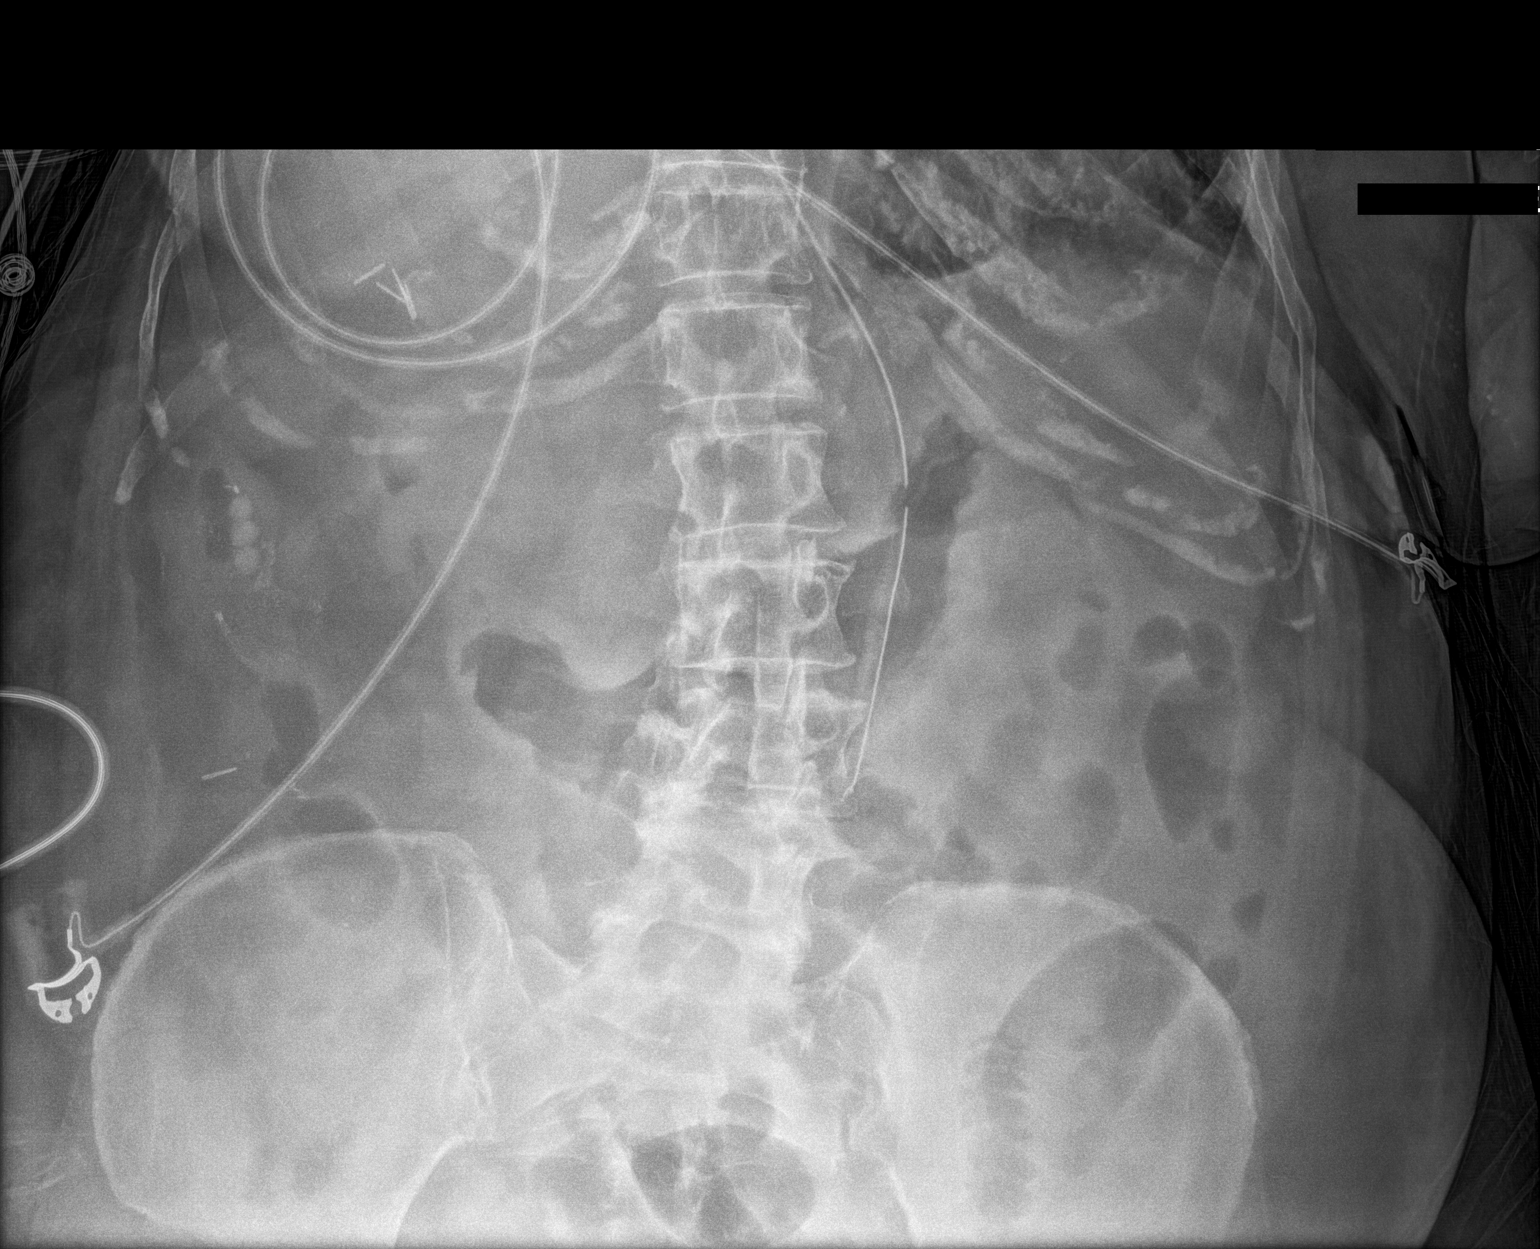

[1 of 1 positions shown; findings below may reference images not displayed]

FINDINGS: Non weighted enteric tube tip and side hole project over the gastric
body. Gas is seen within nondilated abdominal bowel loops. Right
hemiabdomen surgical clips. Multilevel spondylosis.
IMPRESSION: Non weighted enteric tube tip and side hole project over the gastric
body.

## 2022-02-23 IMAGING — DX DG CHEST 1V PORT
1 series · 1 of 1 positions shown · non-contrast
Comparison: 01/25/2020

CLINICAL DATA: Tachycardia

EXAM:
PORTABLE CHEST 1 VIEW

[chest ap]
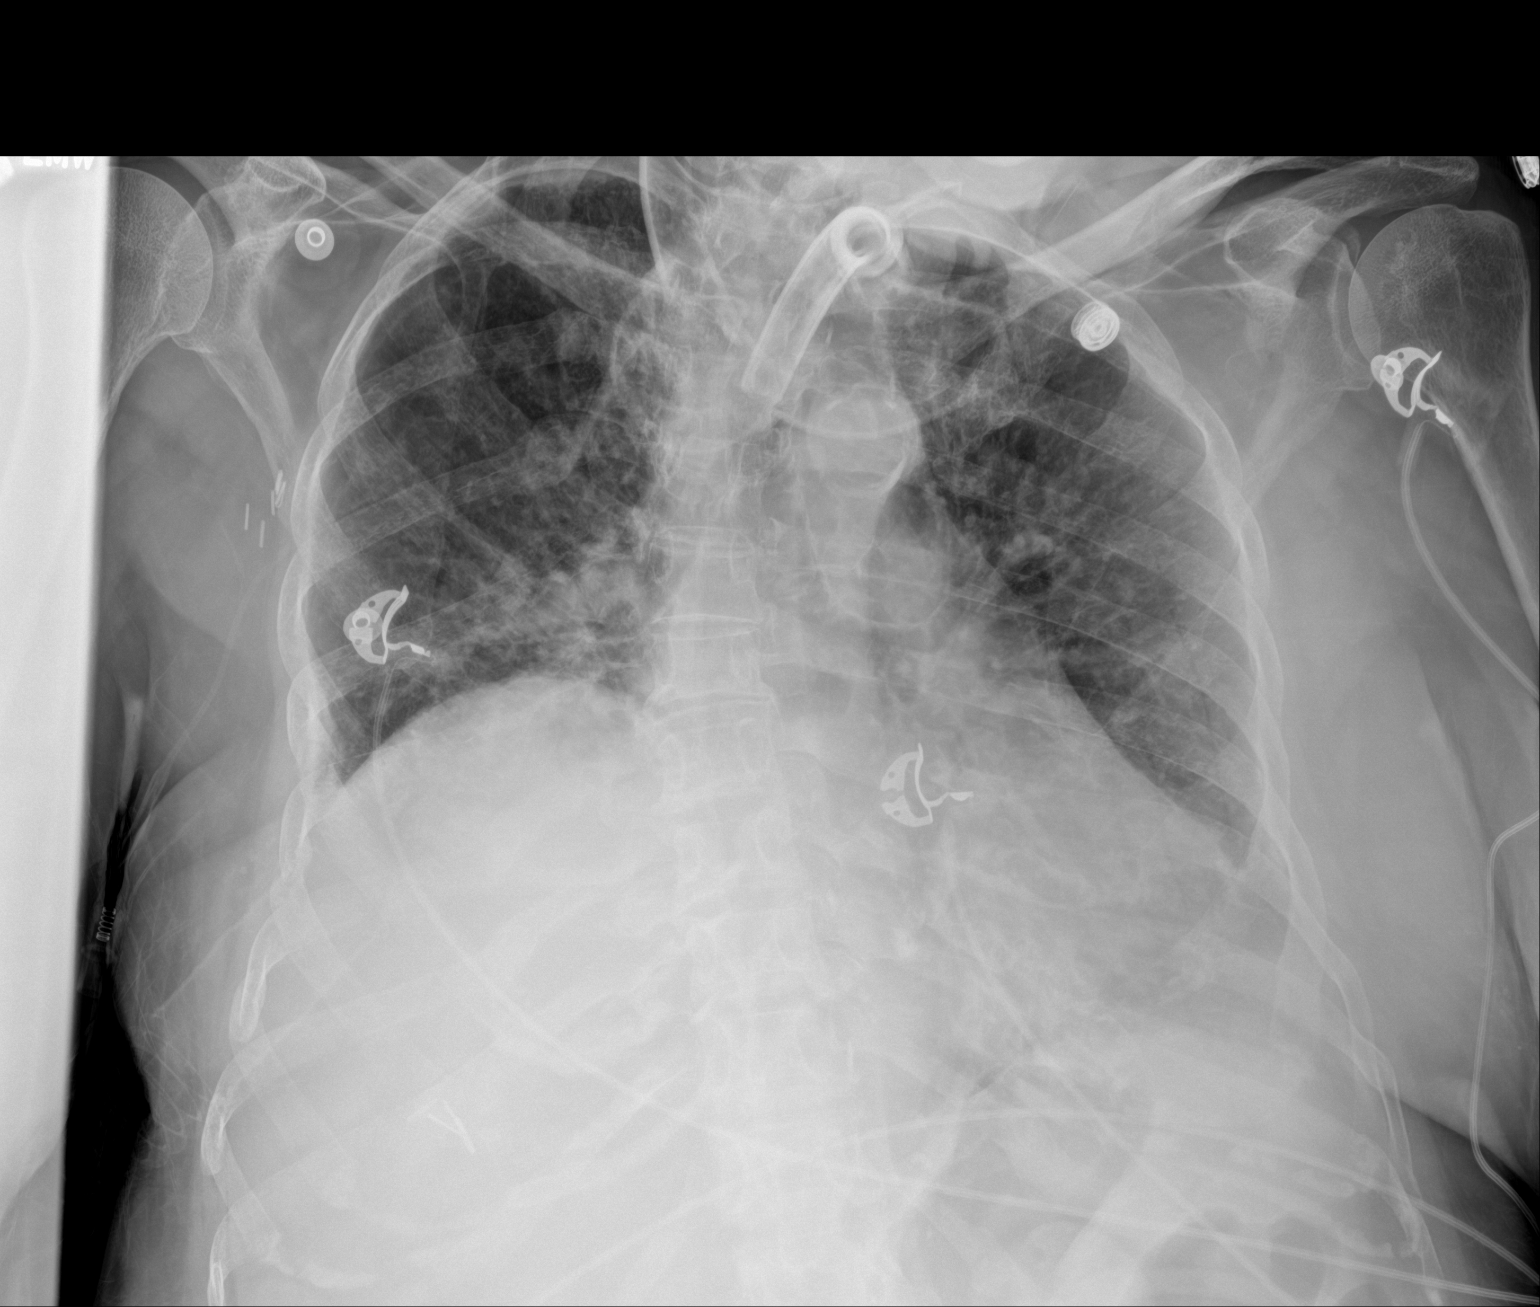

[1 of 1 positions shown; findings below may reference images not displayed]

FINDINGS: Mild interstitial opacity, unchanged. Tracheostomy tube tip is at
the level of the clavicular heads. No pneumothorax or sizable
pleural effusion.
IMPRESSION: Unchanged mild interstitial opacity.

## 2022-02-24 IMAGING — DX DG CHEST 1V PORT
1 series · 1 of 1 positions shown · non-contrast
Comparison: 01/31/2020

CLINICAL DATA: Respiratory failure.

EXAM:
PORTABLE CHEST 1 VIEW

[chest ap]
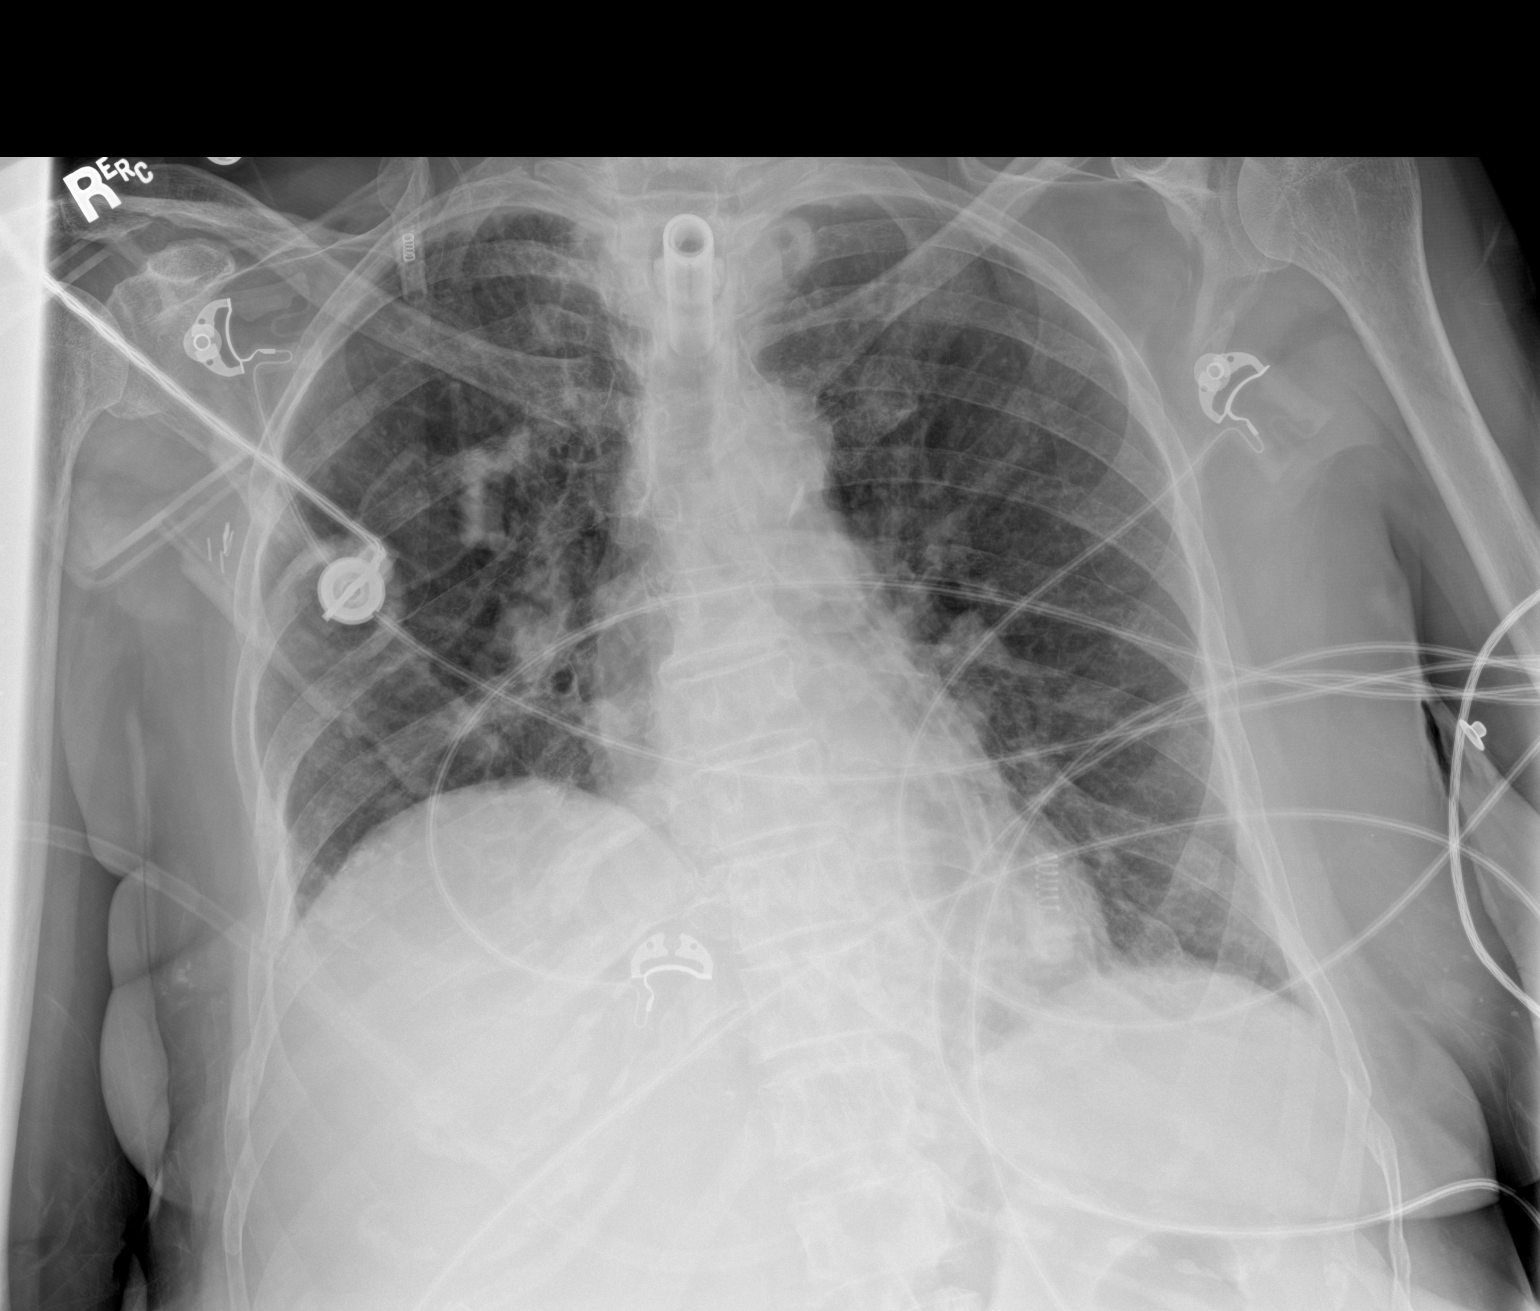

[1 of 1 positions shown; findings below may reference images not displayed]

FINDINGS: The tracheostomy tube is in good position, unchanged.

The cardiac silhouette, mediastinal hilar contours are within normal
limits and stable.

Chronic bronchitic type changes and streaky areas of atelectasis but
no focal infiltrates or effusions.

The bony thorax is intact.
IMPRESSION: 1. Stable tracheostomy tube.
2. Chronic interstitial changes and streaky areas of atelectasis. No
new infiltrates or edema.

## 2022-04-04 ENCOUNTER — Ambulatory Visit (INDEPENDENT_AMBULATORY_CARE_PROVIDER_SITE_OTHER): Payer: Medicare Other | Admitting: Physician Assistant

## 2022-04-04 VITALS — BP 107/64 | HR 74 | Ht 64.0 in | Wt 156.0 lb

## 2022-04-04 DIAGNOSIS — R350 Frequency of micturition: Secondary | ICD-10-CM

## 2022-04-04 DIAGNOSIS — R300 Dysuria: Secondary | ICD-10-CM

## 2022-04-04 LAB — MICROSCOPIC EXAMINATION: WBC, UA: >30 /hpf — AB (ref 0–5)

## 2022-04-04 LAB — URINALYSIS, COMPLETE
Bilirubin, UA: NEGATIVE
Glucose, UA: NEGATIVE
Ketones, UA: NEGATIVE
Nitrite, UA: POSITIVE — AB
Specific Gravity, UA: 1.02 (ref 1.005–1.030)
Urobilinogen, Ur: 0.2 mg/dL (ref 0.2–1.0)
pH, UA: 5.5 (ref 5.0–7.5)

## 2022-04-04 MED ORDER — FOSFOMYCIN TROMETHAMINE 3 G PO PACK: 3.0000 g | PACK | Freq: Once | ORAL | 0 refills | Status: AC

## 2022-04-04 NOTE — Patient Instructions (Signed)
Recurrent UTI Prevention Strategies  Start taking an over-the-counter cranberry supplement for urinary tract health. Take this once or twice daily on an empty stomach, e.g. right before bed. Start taking an over-the-counter d-mannose supplement. Take this daily per packaging instructions. Start taking an over-the-counter probiotic containing the bacterial species called Lactobacillus. Take this daily. Continue vaginal estrogen cream. Apply a pea-sized amount around the opening of the urethra every day for 2 weeks, then three times weekly forever.

## 2022-04-05 NOTE — Progress Notes (Signed)
04/04/2022 1:52 PM   Angela David 31-Oct-1940 [id number]  CC: Chief Complaint  Patient presents with   Urinary Frequency   HPI: Angela David is a 82 y.o. 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  She is accompanied today by her husband, who contributes to HPI.  I first saw her in May 2023 in consultation for recurrent UTIs; she was admitted at the time with sepsis due to ESBL Klebsiella pneumoniae UTI.  She had extensive urine culture data available, however many of these appeared contaminated with squamous epithelial cells or finalized with insignificant or no growth.  At the time, she did not actively meet the clinical definition for recurrent UTIs.  Regardless, I recommended obtaining catheterized urine samples moving forward, continuing tight glycemic control, continuing a bowel regimen for chronic constipation, and starting topical vaginal estrogen cream, cranberry, d-mannose, and lactobacillus containing probiotics.  Today they report she was ultimately hospitalized for about 5 months, however I cannot find any medical records to corroborate this.  At the time that I met her, she was discharged after 10 days to return home with home health services.  They also report that around the time that I previously saw her, she was in a prolonged coma that they ultimately determined was due to an undiagnosed UTI, however I cannot find any records to corroborate this either.  They report that she has had twice monthly UTIs since at least last May, which have been treated by their PCP, Dr. Marvel Plan.  Medical records not available for review.  They state that she was treated most recently about 6 weeks ago with Cipro followed by amoxicillin, however these did not resolve her symptoms and she feels that she has had the same UTI since.  She may have also had some gross  hematuria at this time as well.  Her husband is especially concerned about cloudy urine representing recurrent UTIs.  Patient reports pain with initiation and termination that is localized to the vulva.  She states that this is chronic, however it typically resolves with antibiotics.  She has been using estrogen cream inconsistently.    In-office UA today positive for plus blood, 2+ protein, nitrites, and 3+ leukocytes; urine microscopy with >30 WBCs/HPF, 3-10 RBCs/HPF, and many bacteria.  Notably, urine sample was brought from home.  PMH: Past Medical History:  Diagnosis Date   Breast cancer, right ([place]) 2002   Tx'd with chemotherapy and XRT   Chronic respiratory failure ([place])    s/p tracheostomy in 2012; on ventilatory support at hs   Diabetic peripheral neuropathy ([place]) 08/28/2016   GERD (gastroesophageal reflux disease)    Glaucoma    High cholesterol    History of colon polyps    Osteoarthritis    Personal history of chemotherapy 2002   BREAST CA   Personal history of radiation therapy 2002   BREAST CA   S/P percutaneous endoscopic gastrostomy (PEG) tube placement ([place])    T2DM (type 2 diabetes mellitus) ([place])    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   Ventral hernia    Vocal cord paralysis 2012    Surgical History: Past Surgical History:  Procedure Laterality Date   ABDOMINAL HYSTERECTOMY     BREAST BIOPSY Right 2008   neg   BREAST BIOPSY Right 06/30/2020   "Q" clip-path pending   BREAST EXCISIONAL BIOPSY Right 2002   positive  BREAST LUMPECTOMY Right 2002   chemo and radiation   BREAST LUMPECTOMY WITH RADIOFREQUENCY TAG IDENTIFICATION Right 10/18/2020   Procedure: BREAST LUMPECTOMY WITH RADIOFREQUENCY TAG IDENTIFICATION;  Surgeon: Jules Husbands, MD;  Location: [facility];  Service: General;  Laterality: Right;   BREAST SURGERY Right 2002   LUMPECTOMY, radiation, chemotherapy   CATARACT EXTRACTION, BILATERAL     CHOLECYSTECTOMY N/A 12/10/2017   Procedure:  LAPAROSCOPIC CHOLECYSTECTOMY;  Surgeon: Benjamine Sprague, DO;  Location: [facility];  Service: General;  Laterality: N/A;   COLON RESECTION Right 11/06/2018   Procedure: HAND ASSISTED LAPAROSCOPIC COLON RESECTION, RIGHT;  Surgeon: Jules Husbands, MD;  Location: [facility];  Service: General;  Laterality: Right;   COLONOSCOPY  2009,12/30/2013   COLONOSCOPY WITH PROPOFOL N/A 04/16/2018   Tubulovillous polyp proximal transverse colon, large sessile polyp, proximal transverse colon;  Surgeon: Robert Bellow, MD;  Location: [facility];  Service: Endoscopy;  Laterality: N/A;   ESOPHAGOGASTRODUODENOSCOPY (EGD) WITH PROPOFOL N/A 10/14/2018   Procedure: ESOPHAGOGASTRODUODENOSCOPY (EGD) WITH PROPOFOL;  Surgeon: Lucilla Lame, MD;  Location: [facility];  Service: Endoscopy;  Laterality: N/A;   IR GASTR TUBE CONVERT GASTR-JEJ PER W/FL MOD SED  01/10/2022   IR REPLACE G-TUBE SIMPLE WO FLUORO  11/29/2021   IR REPLC GASTRO/COLONIC TUBE PERCUT W/FLUORO  11/24/2021   TRACHEAL SURGERY  2013   TRACHEOSTOMY N/A    Dr Kathyrn Sheriff   XI ROBOTIC ASSISTED VENTRAL HERNIA N/A 10/18/2020   Procedure: XI ROBOTIC ASSISTED VENTRAL HERNIA;  Surgeon: Jules Husbands, MD;  Location: [facility];  Service: General;  Laterality: N/A;    Home Medications:  Allergies as of 04/04/2022       Reactions   Shellfish Allergy Shortness Of Breath   respitatory distress    Sodium Sulfite Shortness Of Breath   Severe Congestion that creates inability to breath   Sulfa Antibiotics Shortness Of Breath, Other (See Comments)   respitatory distress   Codeine Other (See Comments)   Per patient "it kept her up all night"   Glucerna [alimentum] Itching   Wound Dressing Adhesive Rash        Medication List        Accurate as of April 04, 2022 11:59 PM. If you have any questions, ask your nurse or doctor.          STOP taking these medications    busPIRone 15 MG tablet Commonly known as: BUSPAR   docusate 50 MG/5ML liquid Commonly  known as: COLACE   mouth rinse Liqd solution   psyllium 95 % Pack Commonly known as: HYDROCIL/METAMUCIL       TAKE these medications    acetaminophen 325 MG tablet Commonly known as: TYLENOL Place 2 tablets (650 mg total) into feeding tube every 6 (six) hours as needed for fever.   Baza Protect Moisture Barrier 12 % Crea Generic drug: Zinc Oxide Apply 1 application twice a day by topical route.   buPROPion 150 MG 12 hr tablet Commonly known as: WELLBUTRIN SR Take 150 mg by mouth 2 (two) times daily.   diphenhydrAMINE 25 mg capsule Commonly known as: BENADRYL Take 1 capsule (25 mg total) by mouth every 6 (six) hours as needed for allergies or itching.   EPINEPHrine 0.3 mg/0.3 mL Soaj injection Commonly known as: EPI-PEN Inject 0.3 mLs (0.3 mg dose) into the muscle once as needed for Anaphylaxis for up to 1 dose.   escitalopram 20 MG tablet Commonly known as: LEXAPRO TAKE 1 TABLET VIA TUBE DAILY. TAKE ALONG  WITH 10 MG DOSE.   estradiol 0.1 MG/GM vaginal cream Commonly known as: ESTRACE Insert 1 applicatorful twice a week by vaginal route for 30 days.   ezetimibe 10 MG tablet Commonly known as: ZETIA Take 10 mg by mouth daily.   feeding supplement (KATE FARMS STANDARD 1.4) Liqd liquid Place 1,440 mLs into feeding tube daily.   folic acid 1 MG tablet Commonly known as: FOLVITE Take 1 tablet by mouth daily.   fosfomycin 3 g Pack Commonly known as: MONUROL Take 3 g by mouth once for 1 dose.   free water Soln Place 50 mLs into feeding tube every 4 (four) hours.   FreeStyle Libre 2 Sensor Misc 1 Device by Does not apply route daily.   gabapentin 300 MG capsule Commonly known as: NEURONTIN Place 300 mg into feeding tube every 8 (eight) hours.   HumaLOG KwikPen 100 UNIT/ML KwikPen Generic drug: insulin lispro Inject into the skin.   hydrOXYzine 25 MG tablet Commonly known as: ATARAX Place 25 mg into feeding tube 2 (two) times daily.   insulin glargine  100 UNIT/ML Solostar Pen Commonly known as: LANTUS Inject 10 Units into the skin daily.   Toujeo SoloStar 300 UNIT/ML Solostar Pen Generic drug: insulin glargine (1 Unit Dial) Inject into the skin.   ipratropium-albuterol 0.5-2.5 (3) MG/3ML Soln Commonly known as: DUONEB Take 3 mLs by nebulization every 2 (two) hours as needed.   loratadine 10 MG tablet Commonly known as: CLARITIN Place 1 tablet (10 mg total) into feeding tube daily.   lovastatin 20 MG tablet Commonly known as: MEVACOR Take 20 mg by mouth at bedtime.   meclizine 12.5 MG tablet Commonly known as: ANTIVERT Take 12.5 mg by mouth 3 (three) times daily as needed for dizziness.   Melatonin 5 MG Caps Take by mouth.   metoprolol tartrate 25 MG tablet Commonly known as: LOPRESSOR Place 1 tablet (25 mg total) into feeding tube daily. What changed:  how much to take when to take this   multivitamin Liqd Place 15 mLs into feeding tube daily.   pantoprazole 40 MG tablet Commonly known as: PROTONIX Take 40 mg by mouth daily.   Pen Needles 3/16" 31G X 5 MM Misc 1 Container by Does not apply route daily.   Prolia 60 MG/ML Sosy injection Generic drug: denosumab Inject 1 mL by subcutaneous route.   rOPINIRole 0.5 MG tablet Commonly known as: REQUIP Take 0.5 mg by mouth at bedtime.   sodium chloride HYPERTONIC 3 % nebulizer solution Take 4 mLs by nebulization 2 (two) times daily.   Systane Balance 0.6 % Soln Generic drug: Propylene Glycol Place 1 drop into both eyes 2 (two) times daily.        Allergies:  Allergies  Allergen Reactions   Shellfish Allergy Shortness Of Breath    respitatory distress    Sodium Sulfite Shortness Of Breath    Severe Congestion that creates inability to breath   Sulfa Antibiotics Shortness Of Breath and Other (See Comments)    respitatory distress   Codeine Other (See Comments)    Per patient "it kept her up all night"   Glucerna [Alimentum] Itching   Wound Dressing  Adhesive Rash    Family History: Family History  Problem Relation Age of Onset   Cancer Father        bone cancer   Cancer Sister        lymphoma   Cancer Brother    Breast cancer Neg Hx  Social History:   reports that she has never smoked. She has been exposed to tobacco smoke. She has never used smokeless tobacco. She reports that she does not drink alcohol and does not use drugs.  Physical Exam: BP 107/64   Pulse 74   Ht '5\' 4"'$  (1.626 m)   Wt 156 lb (70.8 kg)   BMI 26.78 kg/m   Constitutional:  Alert and oriented, no acute distress, nontoxic appearing HEENT: [place], AT Cardiovascular: No clubbing, cyanosis, or edema Respiratory: Normal respiratory effort, no increased work of breathing Skin: No rashes, bruises or suspicious lesions Neurologic: In wheelchair Psychiatric: Normal mood and affect  Laboratory Data: Results for orders placed or performed in visit on 04/04/22  Microscopic Examination   Urine  Result Value Ref Range   WBC, UA >30 (A) 0 - 5 /hpf   RBC, Urine 3-10 (A) 0 - 2 /hpf   Epithelial Cells (non renal) 0-10 0 - 10 /hpf   Bacteria, UA Many (A) None seen/Few  Urinalysis, Complete  Result Value Ref Range   Specific Gravity, UA 1.020 1.005 - 1.030   pH, UA 5.5 5.0 - 7.5   Color, UA Yellow Yellow   Appearance Ur Cloudy (A) Clear   Leukocytes,UA 3+ (A) Negative   Protein,UA 2+ (A) Negative/Trace   Glucose, UA Negative Negative   Ketones, UA Negative Negative   RBC, UA 2+ (A) Negative   Bilirubin, UA Negative Negative   Urobilinogen, Ur 0.2 0.2 - 1.0 mg/dL   Nitrite, UA Positive (A) Negative   Microscopic Examination See below:    Assessment & Plan:   1. Dysuria Possible recurrent UTIs, however no medical records available to corroborate this and history is a bit challenging.  My differential includes genitourinary syndrome of menopause, OAB, asymptomatic bacteriuria, and recurrent UTI.  I am concerned about the quality of her urine sample obtained  today and recommend obtaining cath UAs in the future.  Will treat with empiric fosfomycin and send for culture for further evaluation.  Given microscopic hematuria, we will bring her back in 1 week for cath UA and pelvic exam.  I encouraged her to use her estrogen cream 3 times weekly as prescribed.  Will also attempt to obtain medical records from her PCP regarding her possible recurrent UTIs. - Urinalysis, Complete - CULTURE, URINE COMPREHENSIVE - fosfomycin (MONUROL) 3 g PACK; Take 3 g by mouth once for 1 dose.  Dispense: 3 g; Refill: 0   Return in about 1 week (around 04/11/2022) for cath UA and pelvic exam, Will call with results.  Debroah Loop, PA-C  [facility] [address] [place], [place] [address] [phone]

## 2022-04-11 LAB — CULTURE, URINE COMPREHENSIVE

## 2022-04-12 ENCOUNTER — Ambulatory Visit (INDEPENDENT_AMBULATORY_CARE_PROVIDER_SITE_OTHER): Payer: Medicare Other | Admitting: Physician Assistant

## 2022-04-12 ENCOUNTER — Encounter: Payer: Self-pay | Admitting: Physician Assistant

## 2022-04-12 VITALS — BP 94/56 | HR 75 | Ht 64.0 in | Wt 156.0 lb

## 2022-04-12 DIAGNOSIS — R350 Frequency of micturition: Secondary | ICD-10-CM | POA: Diagnosis not present

## 2022-04-12 DIAGNOSIS — N9489 Other specified conditions associated with female genital organs and menstrual cycle: Secondary | ICD-10-CM

## 2022-04-12 MED ORDER — MIRABEGRON ER 50 MG PO TB24: 50.0000 mg | ORAL_TABLET | Freq: Every day | ORAL | 0 refills | Status: DC

## 2022-04-12 NOTE — Progress Notes (Signed)
04/12/2022 3:31 PM   Angela David 1940/03/21 [id number]  CC: Chief Complaint  Patient presents with   Dysuria   HPI: Angela David is a 82 y.o. 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  Urine culture finalized with ESBL Klebsiella pneumoniae.   Today she reports her urine appeared clear after using fosfomycin before becoming cloudy again about 3 days ago.  Her pain, which she again states is localized to the vulva, remained unchanged.  She also reports bothersome urgency, frequency, urge incontinence, and stress incontinence.  She wears diapers for urinary control.  She is s/p abdominal hysterectomy.  She denies other pelvic surgeries, pelvic malignancies, or pelvic radiation.  She denies pneumaturia or fecaluria.  In-office catheterized UA today positive for trace ketones, trace intact blood, 2+ protein, and 3+ leukocytes; urine microscopy with >30 WBCs/HPF and moderate bacteria.   PMH: Past Medical History:  Diagnosis Date   Breast cancer, right ([place]) 2002   Tx'd with chemotherapy and XRT   Chronic respiratory failure ([place])    s/p tracheostomy in 2012; on ventilatory support at hs   Diabetic peripheral neuropathy ([place]) 08/28/2016   GERD (gastroesophageal reflux disease)    Glaucoma    High cholesterol    History of colon polyps    Osteoarthritis    Personal history of chemotherapy 2002   BREAST CA   Personal history of radiation therapy 2002   BREAST CA   S/P percutaneous endoscopic gastrostomy (PEG) tube placement ([facility])    T2DM (type 2 diabetes mellitus) ([place])    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   Ventral hernia    Vocal cord paralysis 2012    Surgical History: Past Surgical History:  Procedure Laterality Date   ABDOMINAL HYSTERECTOMY     BREAST BIOPSY Right 2008   neg    BREAST BIOPSY Right 06/30/2020   "Q" clip-path pending   BREAST EXCISIONAL BIOPSY Right 2002   positive   BREAST LUMPECTOMY Right 2002   chemo and radiation   BREAST LUMPECTOMY WITH RADIOFREQUENCY TAG IDENTIFICATION Right 10/18/2020   Procedure: BREAST LUMPECTOMY WITH RADIOFREQUENCY TAG IDENTIFICATION;  Surgeon: Jules Husbands, MD;  Location: [facility];  Service: General;  Laterality: Right;   BREAST SURGERY Right 2002   LUMPECTOMY, radiation, chemotherapy   CATARACT EXTRACTION, BILATERAL     CHOLECYSTECTOMY N/A 12/10/2017   Procedure: LAPAROSCOPIC CHOLECYSTECTOMY;  Surgeon: Benjamine Sprague, DO;  Location: [facility];  Service: General;  Laterality: N/A;   COLON RESECTION Right 11/06/2018   Procedure: HAND ASSISTED LAPAROSCOPIC COLON RESECTION, RIGHT;  Surgeon: Jules Husbands, MD;  Location: [facility];  Service: General;  Laterality: Right;   COLONOSCOPY  2009,12/30/2013   COLONOSCOPY WITH PROPOFOL N/A 04/16/2018   Tubulovillous polyp proximal transverse colon, large sessile polyp, proximal transverse colon;  Surgeon: Robert Bellow, MD;  Location: [facility];  Service: Endoscopy;  Laterality: N/A;   ESOPHAGOGASTRODUODENOSCOPY (EGD) WITH PROPOFOL N/A 10/14/2018   Procedure: ESOPHAGOGASTRODUODENOSCOPY (EGD) WITH PROPOFOL;  Surgeon: Lucilla Lame, MD;  Location: [facility];  Service: Endoscopy;  Laterality: N/A;   IR GASTR TUBE CONVERT GASTR-JEJ PER W/FL MOD SED  01/10/2022   IR REPLACE G-TUBE SIMPLE WO FLUORO  11/29/2021   IR REPLC GASTRO/COLONIC TUBE PERCUT W/FLUORO  11/24/2021   TRACHEAL SURGERY  2013   TRACHEOSTOMY N/A    Dr Kathyrn Sheriff  XI ROBOTIC ASSISTED VENTRAL HERNIA N/A 10/18/2020   Procedure: XI ROBOTIC ASSISTED VENTRAL HERNIA;  Surgeon: Jules Husbands, MD;  Location: [facility];  Service: General;  Laterality: N/A;    Home Medications:  Allergies as of 04/12/2022       Reactions   Shellfish Allergy Shortness Of Breath   respitatory distress    Sodium Sulfite Shortness Of  Breath   Severe Congestion that creates inability to breath   Sulfa Antibiotics Shortness Of Breath, Other (See Comments)   respitatory distress   Codeine Other (See Comments)   Per patient "it kept her up all night"   Glucerna [alimentum] Itching   Wound Dressing Adhesive Rash        Medication List        Accurate as of April 12, 2022  3:31 PM. If you have any questions, ask your nurse or doctor.          acetaminophen 325 MG tablet Commonly known as: TYLENOL Place 2 tablets (650 mg total) into feeding tube every 6 (six) hours as needed for fever.   Baza Protect Moisture Barrier 12 % Crea Generic drug: Zinc Oxide Apply 1 application twice a day by topical route.   buPROPion 150 MG 12 hr tablet Commonly known as: WELLBUTRIN SR Take 150 mg by mouth 2 (two) times daily.   diphenhydrAMINE 25 mg capsule Commonly known as: BENADRYL Take 1 capsule (25 mg total) by mouth every 6 (six) hours as needed for allergies or itching.   EPINEPHrine 0.3 mg/0.3 mL Soaj injection Commonly known as: EPI-PEN Inject 0.3 mLs (0.3 mg dose) into the muscle once as needed for Anaphylaxis for up to 1 dose.   escitalopram 20 MG tablet Commonly known as: LEXAPRO TAKE 1 TABLET VIA TUBE DAILY. TAKE ALONG WITH 10 MG DOSE.   estradiol 0.1 MG/GM vaginal cream Commonly known as: ESTRACE Insert 1 applicatorful twice a week by vaginal route for 30 days.   ezetimibe 10 MG tablet Commonly known as: ZETIA Take 10 mg by mouth daily.   feeding supplement (KATE FARMS STANDARD 1.4) Liqd liquid Place 1,440 mLs into feeding tube daily.   folic acid 1 MG tablet Commonly known as: FOLVITE Take 1 tablet by mouth daily.   free water Soln Place 50 mLs into feeding tube every 4 (four) hours.   FreeStyle Libre 2 Sensor Misc 1 Device by Does not apply route daily.   gabapentin 300 MG capsule Commonly known as: NEURONTIN Place 300 mg into feeding tube every 8 (eight) hours.   HumaLOG KwikPen 100  UNIT/ML KwikPen Generic drug: insulin lispro Inject into the skin.   hydrOXYzine 25 MG tablet Commonly known as: ATARAX Place 25 mg into feeding tube 2 (two) times daily.   insulin glargine 100 UNIT/ML Solostar Pen Commonly known as: LANTUS Inject 10 Units into the skin daily.   Toujeo SoloStar 300 UNIT/ML Solostar Pen Generic drug: insulin glargine (1 Unit Dial) Inject into the skin.   ipratropium-albuterol 0.5-2.5 (3) MG/3ML Soln Commonly known as: DUONEB Take 3 mLs by nebulization every 2 (two) hours as needed.   loratadine 10 MG tablet Commonly known as: CLARITIN Place 1 tablet (10 mg total) into feeding tube daily.   lovastatin 20 MG tablet Commonly known as: MEVACOR Take 20 mg by mouth at bedtime.   meclizine 12.5 MG tablet Commonly known as: ANTIVERT Take 12.5 mg by mouth 3 (three) times daily as needed for dizziness.   Melatonin 5 MG Caps Take by mouth.  metoprolol tartrate 25 MG tablet Commonly known as: LOPRESSOR Place 1 tablet (25 mg total) into feeding tube daily. What changed:  how much to take when to take this   mirabegron ER 50 MG Tb24 tablet Commonly known as: MYRBETRIQ Take 1 tablet (50 mg total) by mouth daily. Started by: Debroah Loop, PA-C   multivitamin Liqd Place 15 mLs into feeding tube daily.   pantoprazole 40 MG tablet Commonly known as: PROTONIX Take 40 mg by mouth daily.   Pen Needles 3/16" 31G X 5 MM Misc 1 Container by Does not apply route daily.   Prolia 60 MG/ML Sosy injection Generic drug: denosumab Inject 1 mL by subcutaneous route.   rOPINIRole 0.5 MG tablet Commonly known as: REQUIP Take 0.5 mg by mouth at bedtime.   sodium chloride HYPERTONIC 3 % nebulizer solution Take 4 mLs by nebulization 2 (two) times daily.   Systane Balance 0.6 % Soln Generic drug: Propylene Glycol Place 1 drop into both eyes 2 (two) times daily.        Allergies:  Allergies  Allergen Reactions   Shellfish Allergy  Shortness Of Breath    respitatory distress    Sodium Sulfite Shortness Of Breath    Severe Congestion that creates inability to breath   Sulfa Antibiotics Shortness Of Breath and Other (See Comments)    respitatory distress   Codeine Other (See Comments)    Per patient "it kept her up all night"   Glucerna [Alimentum] Itching   Wound Dressing Adhesive Rash    Family History: Family History  Problem Relation Age of Onset   Cancer Father        bone cancer   Cancer Sister        lymphoma   Cancer Brother    Breast cancer Neg Hx     Social History:   reports that she has never smoked. She has been exposed to tobacco smoke. She has never used smokeless tobacco. She reports that she does not drink alcohol and does not use drugs.  Physical Exam: BP (!) 94/56   Pulse 75   Ht 5' 4"$  (1.626 m)   Wt 156 lb (70.8 kg)   BMI 26.78 kg/m   Constitutional:  Alert and oriented, no acute distress, nontoxic appearing HEENT: [place], AT Cardiovascular: No clubbing, cyanosis, or edema Respiratory: Normal respiratory effort, no increased work of breathing Skin: No rashes, bruises or suspicious lesions Neurologic: Grossly intact, no focal deficits, moving all 4 extremities Psychiatric: Normal mood and affect  Laboratory Data: Results for orders placed or performed in visit on 04/12/22  Microscopic Examination   Urine  Result Value Ref Range   WBC, UA >30 (A) 0 - 5 /hpf   RBC, Urine 0-2 0 - 2 /hpf   Epithelial Cells (non renal) 0-10 0 - 10 /hpf   Bacteria, UA Moderate (A) None seen/Few  Urinalysis, Complete  Result Value Ref Range   Specific Gravity, UA 1.025 1.005 - 1.030   pH, UA 5.0 5.0 - 7.5   Color, UA Yellow Yellow   Appearance Ur Cloudy (A) Clear   Leukocytes,UA 3+ (A) Negative   Protein,UA 2+ (A) Negative/Trace   Glucose, UA Negative Negative   Ketones, UA Trace (A) Negative   RBC, UA Trace (A) Negative   Bilirubin, UA Negative Negative   Urobilinogen, Ur 0.2 0.2 - 1.0 mg/dL    Nitrite, UA Negative Negative   Microscopic Examination See below:    Assessment & Plan:   1.  Vulvar burning Not true dysuria.  The only change in her symptoms after fosfomycin last week was clearance of the urine.  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  Her microscopic hematuria has resolved today.  She continues to have pyuria and bacteriuria consistent with possible colonization as above.  I encouraged her to continue topical vaginal estrogen cream 3 times weekly and to start nonhormonal vaginal moisturizers on the days she is not using estrogen cream for symptom relief.  Will plan to see her back in clinic in 6 weeks for symptom recheck and pelvic exam.  If she is still symptomatic at that time, would recommend pursuing cystoscopy.  In the absence of pneumaturia or fecaluria, my suspicion for colovaginal fistula is low. - Urinalysis, Complete - CULTURE, URINE COMPREHENSIVE  2. Urinary frequency Urgency, frequency, and mixed urge and stress incontinence.  Will start a trial of Myrbetriq 50 mg daily and plan for symptom recheck in 6 weeks. - mirabegron ER (MYRBETRIQ) 50 MG TB24 tablet; Take 1 tablet (50 mg total) by mouth daily.  Dispense: 56 tablet; Refill: 0  Return in about 6 weeks (around 05/24/2022) for Symptom recheck with PVR and pelvic exam.  Debroah Loop, [facility]  [place] [address] [place], [place] [address] [phone]

## 2022-04-12 NOTE — Patient Instructions (Signed)
Continue vaginal estrogen cream 3 times weekly. Apply one pea-sized amount around the opening of the urethra (where the urine comes out). On days you aren't using vaginal estrogen cream, try the over-the-counter nonhormonal vaginal moisturizer called Replens. Start daily Myrbetriq samples to help with your urinary urgency, frequency, and leakage.  I'll see you back in 6 weeks to recheck your symptoms.

## 2022-04-12 NOTE — Progress Notes (Signed)
In and Out Catheterization  Patient is present today for a I & O catheterization due to recurrent uti. Patient was cleaned and prepped in a sterile fashion with betadine . A 14FR cath was inserted no complications were noted , 31m of urine return was noted, urine was yellow in color. A clean urine sample was collected for ua. Bladder was drained  And catheter was removed with out difficulty.    Performed by: CElberta Leatherwood [place]

## 2022-04-13 LAB — MICROSCOPIC EXAMINATION: WBC, UA: >30 /hpf — AB (ref 0–5)

## 2022-04-13 LAB — URINALYSIS, COMPLETE
Bilirubin, UA: NEGATIVE
Glucose, UA: NEGATIVE
Nitrite, UA: NEGATIVE
Specific Gravity, UA: 1.025 (ref 1.005–1.030)
Urobilinogen, Ur: 0.2 mg/dL (ref 0.2–1.0)
pH, UA: 5 (ref 5.0–7.5)

## 2022-04-15 ENCOUNTER — Emergency Department
Admission: EM | Admit: 2022-04-15 | Discharge: 2022-04-15 | Disposition: A | Discharge status: Home / Self Care | Payer: Medicare Other | Attending: Emergency Medicine | Admitting: Emergency Medicine

## 2022-04-15 ENCOUNTER — Emergency Department: Payer: Medicare Other

## 2022-04-15 ENCOUNTER — Other Ambulatory Visit: Payer: Self-pay

## 2022-04-15 DIAGNOSIS — E119 Type 2 diabetes mellitus without complications: Secondary | ICD-10-CM | POA: Diagnosis not present

## 2022-04-15 DIAGNOSIS — R0603 Acute respiratory distress: Secondary | ICD-10-CM | POA: Diagnosis present

## 2022-04-15 DIAGNOSIS — J9509 Other tracheostomy complication: Secondary | ICD-10-CM | POA: Insufficient documentation

## 2022-04-15 DIAGNOSIS — J9500 Unspecified tracheostomy complication: Secondary | ICD-10-CM

## 2022-04-15 NOTE — Progress Notes (Addendum)
Pt came in by EMS, trach removed by patient at home. EMS brought trach that was taken out by patient and exact same trach was placed in without difficultly. Equal breath sounds and positive color changed on ETCO2 detector. Placed on aerosol trach at 28%. Sats 98%. Tolerating well at this time. MD notified.   To clarify: A new trach same size and type was placed into patient's stoma without difficultly. A second trach was placed at bedside per Tracheostomy Bedside Equipment Checklist requirements. The patient received 2 trachs which supports the 2 trach charges.

## 2022-04-15 NOTE — ED Provider Notes (Signed)
   [facility] Provider Note    None    (approximate)  History   Chief Complaint: Respiratory Distress  HPI  Angela David is a 82 y.o. female with a past medical history of diabetes, gastric reflux, chronic respiratory failure status post tracheostomy in 2012, presents to the emergency department for dislodged tracheostomy.  According to the patient and record review patient has had tracheostomy for over 10 years.  States it felt like something was stuck in the tracheostomy and the patient removed her tracheostomy.  She felt like something was stuck in there so she did not replace the tracheostomy and instead came to the emergency department for evaluation.  Here the patient appears well, no distress, reassuring vitals.  No inspiratory stridor, clear lung sounds.  Physical Exam   Triage Vital Signs: ED Triage Vitals [04/15/22 1716]  Enc Vitals Group     BP (!) 119/48     Pulse Rate 79     Resp (!) 28     Temp 98.2 F (36.8 C)     Temp Source Oral     SpO2      Weight 156 lb 1.4 oz (70.8 kg)     Height 5' 4"$  (1.626 m)     Head Circumference      Peak Flow      Pain Score 0     Pain Loc      Pain Edu?      Excl. in [place]?     Most recent vital signs: Vitals:   04/15/22 1716  BP: (!) 119/48  Pulse: 79  Resp: (!) 28  Temp: 98.2 F (36.8 C)    General: Awake, no distress.  CV:  Good peripheral perfusion.  Regular rate and rhythm  Resp:  Normal effort.  Equal breath sounds bilaterally.  No stridor. Abd:  No distention.     ED Results / Procedures / Treatments   RADIOLOGY  I have reviewed and interpreted the x-ray images I do not see any obvious or significant consolidation on my evaluation. Radiology is read the x-ray is negative for acute process.   MEDICATIONS ORDERED IN ED: Medications - No data to display   IMPRESSION / MDM / [place] / ED COURSE  I reviewed the triage vital signs and the nursing notes.  Patient's  presentation is most consistent with acute presentation with potential threat to life or bodily function.  Patient presents emergency department for shortness of breath and a dislodged tracheostomy.  Respiratory therapy is in the room with me we will attempt to replace the tracheostomy tube.  Will obtain a chest x-ray to evaluate and continue to closely monitor.  Patient agreeable to plan of care.  Tracheostomy tube replaced by respiratory therapy without issue.  Chest x-ray is clear.  Patient reassured.  Will discharge patient home with PCP follow-up.  Patient agreeable to plan of care.  FINAL CLINICAL IMPRESSION(S) / ED DIAGNOSES   Dislodged tracheostomy tube   Note:  This document was prepared using Dragon voice recognition software and may include unintentional dictation errors.   Harvest Dark, MD 04/15/22 [phone]

## 2022-04-15 NOTE — ED Triage Notes (Signed)
Arrives from home.  EMS called out for c/o difficulty breathing.  Lurline Idol had been taken out to try to clear a plug.  Oxygen sat 80's upon EMS arrival.  15L started and sats improved to 99%.  VS wnl.  RR:  22-24.

## 2022-04-17 LAB — CULTURE, URINE COMPREHENSIVE

## 2022-04-24 ENCOUNTER — Ambulatory Visit: Payer: Medicare Other | Admitting: Urology

## 2022-04-24 ENCOUNTER — Ambulatory Visit: Payer: Medicare Other | Admitting: Neurology

## 2022-04-24 ENCOUNTER — Encounter: Payer: Self-pay | Admitting: Neurology

## 2022-04-24 ENCOUNTER — Telehealth (INDEPENDENT_AMBULATORY_CARE_PROVIDER_SITE_OTHER): Payer: Medicare Other | Admitting: Neurology

## 2022-04-24 ENCOUNTER — Telehealth: Payer: Self-pay | Admitting: Neurology

## 2022-04-24 ENCOUNTER — Encounter: Payer: Self-pay | Admitting: Internal Medicine

## 2022-04-24 VITALS — BP 108/66 | HR 84

## 2022-04-24 DIAGNOSIS — E1142 Type 2 diabetes mellitus with diabetic polyneuropathy: Secondary | ICD-10-CM

## 2022-04-24 DIAGNOSIS — R29898 Other symptoms and signs involving the musculoskeletal system: Secondary | ICD-10-CM

## 2022-04-24 DIAGNOSIS — R269 Unspecified abnormalities of gait and mobility: Secondary | ICD-10-CM | POA: Diagnosis not present

## 2022-04-24 DIAGNOSIS — G1220 Motor neuron disease, unspecified: Secondary | ICD-10-CM

## 2022-04-24 DIAGNOSIS — M6281 Muscle weakness (generalized): Secondary | ICD-10-CM

## 2022-04-24 NOTE — Telephone Encounter (Signed)
Medicare NPR sent to [place] [phone]

## 2022-04-24 NOTE — Telephone Encounter (Signed)
Get the result of the [employer] testing by Dr. Aletha Halim in August 2022

## 2022-04-24 NOTE — Progress Notes (Unsigned)
Chief Complaint  Patient presents with   Follow-up    Rm 15, Weakness in legs since hospital visit in 04/15/22, in wheelchair  Would like to discuss lab results       Angela David is a 82 y.o. female   Long history of gradual onset episode of breathing difficulty, swallowing difficulty Status post tracheostomy in 2012, PEG tube placement in 2022,  Extraocular movement abnormalities,  Slow worsening bilateral upper and lower extremity weakness, gait abnormality,  Family desire further evaluations,  Had Angela David genetic testing following her visit with Dr. Jannifer Franklin in August 2022, concerning for mitochondrial disease, will get the report,  MRI of the brain, cervical spine neural axis structural abnormality  Repeat EMG nerve conduction study  May consider muscle biopsy if needed    DIAGNOSTIC DATA (LABS, IMAGING, TESTING) - I reviewed patient records, labs, notes, testing and imaging myself where available.  Addendum Angela David genetic testing for progressive external ophthalmoplegia evaluation was negative on February 14, 2021 MEDICAL HISTORY:  Angela David is a 82 year old female, seen in request by her primary care physician Dr. Derrick Ravel for evaluation of worsening breathing difficulty, swallowing difficulty, she is companied by her husband and caregiver at today's visit April 24, 2022,  I reviewed and summarized the referring note. PMHX. DM PN Depression,  HLD Restless leg syndrome, Right breast cancer, s/p lobectomy, chemotherapy in 2002,  She began to develop into did not difficulty breathing, initially was told to have vocal cord paralysis, was diagnosis with laryngeal spasm, become more frequent, to the point of gasping for air, turning purple, eventually required tracheostomy in 2012, but no clear etiology found, she still uses ventilator at nighttime,  She then developed gradual onset gait abnormality, frequent fall since  2020, broke her ribs, developed pneumothorax in 2020, began to rely on walker wheelchair in 2021  She also have gradual onset swallowing difficulty, eventually had PEG tube placement in 2022, herself did not notice any extraocular eye movement difficulties, but there was noticeable restriction of extraocular eye movement  She was seen by Dr. Jannifer Franklin since 2022, had [employer] genetic test, that was reported negative  PHYSICAL EXAM:   Vitals:   04/24/22 1323  BP: 108/66  Pulse: 84   There is no height or weight on file to calculate BMI.  PHYSICAL EXAMNIATION:  Gen: NAD, conversant, well nourised, well groomed                     Cardiovascular: Regular rate rhythm, no peripheral edema, warm, nontender. Eyes: Conjunctivae clear without exudates or hemorrhage Neck: Limited range of motion, tendency of neck tilt to the left shoulder, status post tracheostomy Pulmonary: Clear to auscultation bilaterally   NEUROLOGICAL EXAM:  MENTAL STATUS: Speech/cognition: Awake, alert, oriented to history taking and casual conversation CRANIAL NERVES: CN II: Visual fields are full to confrontation. Pupils are round equal and briskly reactive to light. CN III, IV, VI: Limited range of motion of extraocular muscle to both horizontal and vertical direction. No ptosis. CN V: Facial sensation is intact to light touch CN VII: Face is symmetric with normal eye closure  CN VIII: Hearing is normal to causal conversation. CN IX, X: Phonation is normal. CN XI: Head turning and shoulder shrug are intact  MOTOR: Mild neck flexion, shoulder abduction, external rotation, bilateral hip flexion weakness  REFLEXES: Reflexes are trace and symmetric at the biceps, triceps, absent at knees, and ankles. Plantar responses are flexor.  SENSORY: Length-dependent decreased light touch, pinprick, vibratory sensation to knee level,  COORDINATION: There is no trunk or limb dysmetria noted.  GAIT/STANCE: Need 2 people  assistance to get up from seated position, difficulty initiate gait, very unsteady  REVIEW OF SYSTEMS:  Full 14 system review of systems performed and notable only for as above All other review of systems were negative.   ALLERGIES: Allergies  Allergen Reactions   Shellfish Allergy Shortness Of Breath    respitatory distress    Sodium Sulfite Shortness Of Breath    Severe Congestion that creates inability to breath   Sulfa Antibiotics Shortness Of Breath and Other (See Comments)    respitatory distress   Codeine Other (See Comments)    Per patient "it kept her up all night"   Glucerna [Alimentum] Itching   Wound Dressing Adhesive Rash    HOME MEDICATIONS: Current Outpatient Medications  Medication Sig Dispense Refill   acetaminophen (TYLENOL) 325 MG tablet Place 2 tablets (650 mg total) into feeding tube every 6 (six) hours as needed for fever.     buPROPion (WELLBUTRIN SR) 150 MG 12 hr tablet Take 150 mg by mouth 2 (two) times daily.     Continuous Blood Gluc Sensor (FREESTYLE LIBRE 2 SENSOR) MISC 1 Device by Does not apply route daily. 30 each 1   denosumab (PROLIA) 60 MG/ML SOSY injection Inject 1 mL by subcutaneous route.     diphenhydrAMINE (BENADRYL) 25 mg capsule Take 1 capsule (25 mg total) by mouth every 6 (six) hours as needed for allergies or itching. 30 capsule 0   EPINEPHrine 0.3 mg/0.3 mL IJ SOAJ injection Inject 0.3 mLs (0.3 mg dose) into the muscle once as needed for Anaphylaxis for up to 1 dose.     escitalopram (LEXAPRO) 20 MG tablet TAKE 1 TABLET VIA TUBE DAILY. TAKE ALONG WITH 10 MG DOSE.     estradiol (ESTRACE) 0.1 MG/GM vaginal cream Insert 1 applicatorful twice a week by vaginal route for 30 days.     ezetimibe (ZETIA) 10 MG tablet Take 10 mg by mouth daily.     folic acid (FOLVITE) 1 MG tablet Take 1 tablet by mouth daily.     gabapentin (NEURONTIN) 300 MG capsule Place 300 mg into feeding tube every 8 (eight) hours.     HUMALOG KWIKPEN 100 UNIT/ML KwikPen  Inject into the skin.     hydrOXYzine (ATARAX) 25 MG tablet Place 25 mg into feeding tube 2 (two) times daily.     insulin glargine (LANTUS) 100 UNIT/ML Solostar Pen Inject 10 Units into the skin daily. 15 mL 11   Insulin Pen Needle (PEN NEEDLES 3/16") 31G X 5 MM MISC 1 Container by Does not apply route daily. 30 each 1   ipratropium-albuterol (DUONEB) 0.5-2.5 (3) MG/3ML SOLN Take 3 mLs by nebulization every 2 (two) hours as needed. 360 mL    loratadine (CLARITIN) 10 MG tablet Place 1 tablet (10 mg total) into feeding tube daily.     lovastatin (MEVACOR) 20 MG tablet Take 20 mg by mouth at bedtime.     meclizine (ANTIVERT) 12.5 MG tablet Take 12.5 mg by mouth 3 (three) times daily as needed for dizziness.     Melatonin 5 MG CAPS Take by mouth.     metoprolol tartrate (LOPRESSOR) 25 MG tablet Place 1 tablet (25 mg total) into feeding tube daily. (Patient taking differently: Place 12.5 mg into feeding tube 2 (two) times daily.)     mirabegron ER (MYRBETRIQ) 50 MG  TB24 tablet Take 1 tablet (50 mg total) by mouth daily. 56 tablet 0   Multiple Vitamin (MULTIVITAMIN) LIQD Place 15 mLs into feeding tube daily.     Nutritional Supplements (FEEDING SUPPLEMENT, KATE FARMS STANDARD 1.4,) LIQD liquid Place 1,440 mLs into feeding tube daily.     pantoprazole (PROTONIX) 40 MG tablet Take 40 mg by mouth daily.     Propylene Glycol (SYSTANE BALANCE) 0.6 % SOLN Place 1 drop into both eyes 2 (two) times daily.     rOPINIRole (REQUIP) 0.5 MG tablet Take 0.5 mg by mouth at bedtime.     sodium chloride HYPERTONIC 3 % nebulizer solution Take 4 mLs by nebulization 2 (two) times daily. 750 mL 12   TOUJEO SOLOSTAR 300 UNIT/ML Solostar Pen Inject into the skin.     Water For Irrigation, Sterile (FREE WATER) SOLN Place 50 mLs into feeding tube every 4 (four) hours.     Zinc Oxide (BAZA PROTECT MOISTURE BARRIER) 12 % CREA Apply 1 application twice a day by topical route.     No current facility-administered medications for  this visit.    PAST MEDICAL HISTORY: Past Medical History:  Diagnosis Date   Breast cancer, right ([place]) 2002   Tx'd with chemotherapy and XRT   Chronic respiratory failure ([place])    s/p tracheostomy in 2012; on ventilatory support at hs   Diabetic peripheral neuropathy ([place]) 08/28/2016   GERD (gastroesophageal reflux disease)    Glaucoma    High cholesterol    History of colon polyps    Osteoarthritis    Personal history of chemotherapy 2002   BREAST CA   Personal history of radiation therapy 2002   BREAST CA   S/P percutaneous endoscopic gastrostomy (PEG) tube placement ([place])    T2DM (type 2 diabetes mellitus) ([place])    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   Ventral hernia    Vocal cord paralysis 2012    PAST SURGICAL HISTORY: Past Surgical History:  Procedure Laterality Date   ABDOMINAL HYSTERECTOMY     BREAST BIOPSY Right 2008   neg   BREAST BIOPSY Right 06/30/2020   "Q" clip-path pending   BREAST EXCISIONAL BIOPSY Right 2002   positive   BREAST LUMPECTOMY Right 2002   chemo and radiation   BREAST LUMPECTOMY WITH RADIOFREQUENCY TAG IDENTIFICATION Right 10/18/2020   Procedure: BREAST LUMPECTOMY WITH RADIOFREQUENCY TAG IDENTIFICATION;  Surgeon: Jules Husbands, MD;  Location: [facility];  Service: General;  Laterality: Right;   BREAST SURGERY Right 2002   LUMPECTOMY, radiation, chemotherapy   CATARACT EXTRACTION, BILATERAL     CHOLECYSTECTOMY N/A 12/10/2017   Procedure: LAPAROSCOPIC CHOLECYSTECTOMY;  Surgeon: Benjamine Sprague, DO;  Location: [facility];  Service: General;  Laterality: N/A;   COLON RESECTION Right 11/06/2018   Procedure: HAND ASSISTED LAPAROSCOPIC COLON RESECTION, RIGHT;  Surgeon: Jules Husbands, MD;  Location: [facility];  Service: General;  Laterality: Right;   COLONOSCOPY  2009,12/30/2013   COLONOSCOPY WITH PROPOFOL N/A 04/16/2018   Tubulovillous polyp proximal transverse colon, large sessile polyp, proximal transverse colon;  Surgeon: Robert Bellow, MD;   Location: [facility];  Service: Endoscopy;  Laterality: N/A;   ESOPHAGOGASTRODUODENOSCOPY (EGD) WITH PROPOFOL N/A 10/14/2018   Procedure: ESOPHAGOGASTRODUODENOSCOPY (EGD) WITH PROPOFOL;  Surgeon: Lucilla Lame, MD;  Location: [facility];  Service: Endoscopy;  Laterality: N/A;   IR GASTR TUBE CONVERT GASTR-JEJ PER W/FL MOD SED  01/10/2022   IR REPLACE G-TUBE SIMPLE WO FLUORO  11/29/2021   IR REPLC GASTRO/COLONIC TUBE PERCUT W/FLUORO  11/24/2021   TRACHEAL SURGERY  2013   TRACHEOSTOMY N/A    Dr Kathyrn Sheriff   XI ROBOTIC ASSISTED VENTRAL HERNIA N/A 10/18/2020   Procedure: XI ROBOTIC ASSISTED VENTRAL HERNIA;  Surgeon: Jules Husbands, MD;  Location: [facility];  Service: General;  Laterality: N/A;    FAMILY HISTORY: Family History  Problem Relation Age of Onset   Cancer Father        bone cancer   Cancer Sister        lymphoma   Cancer Brother    Breast cancer Neg Hx     SOCIAL HISTORY: Social History   Socioeconomic History   Marital status: Married    Spouse name: [name]   Number of children: Not on file   Years of education: Not on file   Highest education level: Not on file  Occupational History   Not on file  Tobacco Use   Smoking status: Never    Passive exposure: Past   Smokeless tobacco: Never  Vaping Use   Vaping Use: Never used  Substance and Sexual Activity   Alcohol use: No   Drug use: No   Sexual activity: Not on file  Other Topics Concern   Not on file  Social History Narrative   Lives with husband   Right Handed   Drinks 2-3 cups caffeine daily   Social Determinants of Health   Financial Resource Strain: Not on file  Food Insecurity: Not on file  Transportation Needs: Not on file  Physical Activity: Not on file  Stress: Not on file  Social Connections: Not on file  Intimate Partner Violence: Not on file      Marcial Pacas, M.D. Ph.D.  [facility] [address], [place], [place] [address] Ph: [phone] Fax:  [phone]  CC:  Bobby Rumpf, MD No address on file  Bobby Rumpf, MD

## 2022-05-02 ENCOUNTER — Encounter: Payer: Self-pay | Admitting: Internal Medicine

## 2022-05-03 ENCOUNTER — Ambulatory Visit
Admission: RE | Admit: 2022-05-03 | Discharge: 2022-05-03 | Disposition: A | Discharge status: Home / Self Care | Payer: Medicare Other | Source: Ambulatory Visit | Attending: Neurology | Admitting: Neurology

## 2022-05-03 DIAGNOSIS — M6281 Muscle weakness (generalized): Secondary | ICD-10-CM

## 2022-05-03 DIAGNOSIS — E1142 Type 2 diabetes mellitus with diabetic polyneuropathy: Secondary | ICD-10-CM | POA: Diagnosis present

## 2022-05-03 DIAGNOSIS — R29898 Other symptoms and signs involving the musculoskeletal system: Secondary | ICD-10-CM | POA: Diagnosis present

## 2022-05-03 DIAGNOSIS — G1220 Motor neuron disease, unspecified: Secondary | ICD-10-CM | POA: Diagnosis present

## 2022-05-03 DIAGNOSIS — R269 Unspecified abnormalities of gait and mobility: Secondary | ICD-10-CM

## 2022-05-09 ENCOUNTER — Telehealth: Payer: Self-pay | Admitting: Neurology

## 2022-05-09 NOTE — Telephone Encounter (Signed)
MRI of the brain showed generalized atrophy, severe small vessel disease  MRI of the cervical spine showed multilevel degenerative changes, canal and neuroforaminal narrowing at C5-6,  Above findings are chronic, will review MRIs at her next follow-up visit   IMPRESSION: 1. No acute intracranial abnormality. 2. Severe chronic small vessel ischemic disease.    IMPRESSION: 1. Motion degraded examination.  Grossly normal spinal cord signal. 2. Unchanged moderate spinal and neural foraminal stenosis at C5-6. 3. Mild spinal stenosis and mild-to-moderate left neural foraminal stenosis at C6-7, stable to minimally progressed. 4. New mild left facet edema at C3-4.

## 2022-05-10 NOTE — Telephone Encounter (Signed)
I called patient.  I discussed her MRI brain and cervical spine results.  I advised her that Dr. Krista Blue will review these MRIs with her at her next follow-up appointment in April.  Patient verbalized understanding and appreciation.  Patient had no further questions or concerns at this time.

## 2022-05-24 ENCOUNTER — Encounter: Payer: Self-pay | Admitting: Physician Assistant

## 2022-05-24 ENCOUNTER — Ambulatory Visit: Payer: Medicare Other | Admitting: Physician Assistant

## 2022-05-24 VITALS — BP 102/64 | HR 76 | Ht 65.0 in | Wt 150.0 lb

## 2022-05-24 DIAGNOSIS — R350 Frequency of micturition: Secondary | ICD-10-CM | POA: Diagnosis not present

## 2022-05-24 DIAGNOSIS — R300 Dysuria: Secondary | ICD-10-CM | POA: Diagnosis not present

## 2022-05-24 LAB — BLADDER SCAN AMB NON-IMAGING: Scan Result: 84

## 2022-05-24 MED ORDER — GEMTESA 75 MG PO TABS: 75.0000 mg | ORAL_TABLET | Freq: Every day | ORAL | 0 refills | Status: DC

## 2022-05-24 NOTE — Progress Notes (Signed)
05/24/2022 4:55 PM   Angela David December 19, 1940 [id number]  CC: Chief Complaint  Patient presents with   Urinary Frequency   HPI: Angela David is a 82 y.o. female with PMH remote breast cancer, DM2, vocal cord paralysis with chronic trach and PEG, possible recurrent UTIs versus colonization, OAB wet with mixed urge and stress incontinence, and chronic vulvar burning on topical vaginal estrogen cream and nonhormonal vaginal moisturizers who presents today for emptying recheck on Myrbetriq 50 mg.   Today she reports she noticed very little change in her urinary symptoms on Myrbetriq.  Overall she thinks she may have had slightly improved burning pain with urination.  Overall she thinks her urgency/frequency were stable.  She is primarily concerned today with her chronic burning, which she states is unchanged.  She denies gross hematuria, fever, or flank pain.  PVR 84 mL.  PMH: Past Medical History:  Diagnosis Date   Breast cancer, right ([place]) 2002   Tx'd with chemotherapy and XRT   Chronic respiratory failure ([place])    s/p tracheostomy in 2012; on ventilatory support at hs   Diabetic peripheral neuropathy ([place]) 08/28/2016   GERD (gastroesophageal reflux disease)    Glaucoma    High cholesterol    History of colon polyps    Osteoarthritis    Personal history of chemotherapy 2002   BREAST CA   Personal history of radiation therapy 2002   BREAST CA   S/P percutaneous endoscopic gastrostomy (PEG) tube placement ([facility])    T2DM (type 2 diabetes mellitus) ([place])    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   Ventral hernia    Vocal cord paralysis 2012    Surgical History: Past Surgical History:  Procedure Laterality Date   ABDOMINAL HYSTERECTOMY     BREAST BIOPSY Right 2008   neg   BREAST BIOPSY Right 06/30/2020   "Q" clip-path pending   BREAST EXCISIONAL BIOPSY Right 2002   positive   BREAST LUMPECTOMY Right 2002   chemo and radiation   BREAST LUMPECTOMY WITH  RADIOFREQUENCY TAG IDENTIFICATION Right 10/18/2020   Procedure: BREAST LUMPECTOMY WITH RADIOFREQUENCY TAG IDENTIFICATION;  Surgeon: Jules Husbands, MD;  Location: [facility];  Service: General;  Laterality: Right;   BREAST SURGERY Right 2002   LUMPECTOMY, radiation, chemotherapy   CATARACT EXTRACTION, BILATERAL     CHOLECYSTECTOMY N/A 12/10/2017   Procedure: LAPAROSCOPIC CHOLECYSTECTOMY;  Surgeon: Benjamine Sprague, DO;  Location: [facility];  Service: General;  Laterality: N/A;   COLON RESECTION Right 11/06/2018   Procedure: HAND ASSISTED LAPAROSCOPIC COLON RESECTION, RIGHT;  Surgeon: Jules Husbands, MD;  Location: [facility];  Service: General;  Laterality: Right;   COLONOSCOPY  2009,12/30/2013   COLONOSCOPY WITH PROPOFOL N/A 04/16/2018   Tubulovillous polyp proximal transverse colon, large sessile polyp, proximal transverse colon;  Surgeon: Robert Bellow, MD;  Location: [facility];  Service: Endoscopy;  Laterality: N/A;   ESOPHAGOGASTRODUODENOSCOPY (EGD) WITH PROPOFOL N/A 10/14/2018   Procedure: ESOPHAGOGASTRODUODENOSCOPY (EGD) WITH PROPOFOL;  Surgeon: Lucilla Lame, MD;  Location: [facility];  Service: Endoscopy;  Laterality: N/A;   IR GASTR TUBE CONVERT GASTR-JEJ PER W/FL MOD SED  01/10/2022   IR REPLACE G-TUBE SIMPLE WO FLUORO  11/29/2021   IR REPLC GASTRO/COLONIC TUBE PERCUT W/FLUORO  11/24/2021   TRACHEAL SURGERY  2013   TRACHEOSTOMY N/A    Dr Kathyrn Sheriff   XI ROBOTIC ASSISTED VENTRAL HERNIA N/A 10/18/2020   Procedure: XI ROBOTIC ASSISTED VENTRAL HERNIA;  Surgeon: Jules Husbands, MD;  Location: [facility];  Service: General;  Laterality: N/A;    Home Medications:  Allergies as of 05/24/2022       Reactions   Shellfish Allergy Shortness Of Breath   respitatory distress    Sodium Sulfite Shortness Of Breath   Severe Congestion that creates inability to breath   Sulfa Antibiotics Shortness Of Breath, Other (See Comments)   respitatory distress   Codeine Other (See Comments)   Per patient  "it kept her up all night"   Glucerna [alimentum] Itching   Wound Dressing Adhesive Rash        Medication List        Accurate as of May 24, 2022  4:55 PM. If you have any questions, ask your nurse or doctor.          STOP taking these medications    mirabegron ER 50 MG Tb24 tablet Commonly known as: MYRBETRIQ Stopped by: Debroah Loop, PA-C       TAKE these medications    acetaminophen 325 MG tablet Commonly known as: TYLENOL Place 2 tablets (650 mg total) into feeding tube every 6 (six) hours as needed for fever.   Baza Protect Moisture Barrier 12 % Crea Generic drug: Zinc Oxide Apply 1 application twice a day by topical route.   buPROPion 150 MG 12 hr tablet Commonly known as: WELLBUTRIN SR Take 150 mg by mouth 2 (two) times daily.   diphenhydrAMINE 25 mg capsule Commonly known as: BENADRYL Take 1 capsule (25 mg total) by mouth every 6 (six) hours as needed for allergies or itching.   EPINEPHrine 0.3 mg/0.3 mL Soaj injection Commonly known as: EPI-PEN Inject 0.3 mLs (0.3 mg dose) into the muscle once as needed for Anaphylaxis for up to 1 dose.   escitalopram 20 MG tablet Commonly known as: LEXAPRO TAKE 1 TABLET VIA TUBE DAILY. TAKE ALONG WITH 10 MG DOSE.   estradiol 0.1 MG/GM vaginal cream Commonly known as: ESTRACE Insert 1 applicatorful twice a week by vaginal route for 30 days.   ezetimibe 10 MG tablet Commonly known as: ZETIA Take 10 mg by mouth daily.   feeding supplement (KATE FARMS STANDARD 1.4) Liqd liquid Place 1,440 mLs into feeding tube daily.   folic acid 1 MG tablet Commonly known as: FOLVITE Take 1 tablet by mouth daily.   free water Soln Place 50 mLs into feeding tube every 4 (four) hours.   FreeStyle Libre 2 Sensor Misc 1 Device by Does not apply route daily.   gabapentin 300 MG capsule Commonly known as: NEURONTIN Place 300 mg into feeding tube every 8 (eight) hours.   Gemtesa 75 MG Tabs Generic drug:  Vibegron Take 1 tablet (75 mg total) by mouth daily. Started by: Debroah Loop, PA-C   HumaLOG KwikPen 100 UNIT/ML KwikPen Generic drug: insulin lispro Inject into the skin.   hydrOXYzine 25 MG tablet Commonly known as: ATARAX Place 25 mg into feeding tube 2 (two) times daily.   insulin glargine 100 UNIT/ML Solostar Pen Commonly known as: LANTUS Inject 10 Units into the skin daily.   Toujeo SoloStar 300 UNIT/ML Solostar Pen Generic drug: insulin glargine (1 Unit Dial) Inject into the skin.   ipratropium-albuterol 0.5-2.5 (3) MG/3ML Soln Commonly known as: DUONEB Take 3 mLs by nebulization every 2 (two) hours as needed.   loratadine 10 MG tablet Commonly known as: CLARITIN Place 1 tablet (10 mg total) into feeding tube daily.   lovastatin 20 MG tablet Commonly known as: MEVACOR Take 20 mg by mouth at bedtime.  meclizine 12.5 MG tablet Commonly known as: ANTIVERT Take 12.5 mg by mouth 3 (three) times daily as needed for dizziness.   Melatonin 5 MG Caps Take by mouth.   metoprolol tartrate 25 MG tablet Commonly known as: LOPRESSOR Place 1 tablet (25 mg total) into feeding tube daily. What changed:  how much to take when to take this   multivitamin Liqd Place 15 mLs into feeding tube daily.   pantoprazole 40 MG tablet Commonly known as: PROTONIX Take 40 mg by mouth daily.   Pen Needles 3/16" 31G X 5 MM Misc 1 Container by Does not apply route daily.   Prolia 60 MG/ML Sosy injection Generic drug: denosumab Inject 1 mL by subcutaneous route.   rOPINIRole 0.5 MG tablet Commonly known as: REQUIP Take 0.5 mg by mouth at bedtime.   sodium chloride HYPERTONIC 3 % nebulizer solution Take 4 mLs by nebulization 2 (two) times daily.   Systane Balance 0.6 % Soln Generic drug: Propylene Glycol Place 1 drop into both eyes 2 (two) times daily.        Allergies:  Allergies  Allergen Reactions   Shellfish Allergy Shortness Of Breath    respitatory  distress    Sodium Sulfite Shortness Of Breath    Severe Congestion that creates inability to breath   Sulfa Antibiotics Shortness Of Breath and Other (See Comments)    respitatory distress   Codeine Other (See Comments)    Per patient "it kept her up all night"   Glucerna [Alimentum] Itching   Wound Dressing Adhesive Rash    Family History: Family History  Problem Relation Age of Onset   Cancer Father        bone cancer   Cancer Sister        lymphoma   Cancer Brother    Breast cancer Neg Hx     Social History:   reports that she has never smoked. She has been exposed to tobacco smoke. She has never used smokeless tobacco. She reports that she does not drink alcohol and does not use drugs.  Physical Exam: BP 102/64   Pulse 76   Ht 5\' 5"  (1.651 m)   Wt 150 lb (68 kg)   BMI 24.96 kg/m   Constitutional:  Alert and oriented, no acute distress, nontoxic appearing HEENT: [place], AT Cardiovascular: No clubbing, cyanosis, or edema Respiratory: Normal respiratory effort, no increased work of breathing GU: Sparse pubic hair, pale labia.  Severe clitoral phimosis.  Retracted urethral meatus on the anterior vaginal wall.  No labial erythema or abnormal vaginal discharge.  Negative cough test. Skin: No rashes, bruises or suspicious lesions Neurologic: Grossly intact, no focal deficits, moving all 4 extremities Psychiatric: Normal mood and affect  Laboratory Data: Results for orders placed or performed in visit on 05/24/22  Bladder Scan (Post Void Residual) in office  Result Value Ref Range   Scan Result 84 ml    Assessment & Plan:   1. Urinary frequency No significant improvement on Myrbetriq, will switch to [place].  6 weeks of samples provided today. - Bladder Scan (Post Void Residual) in office - Vibegron (GEMTESA) 75 MG TABS; Take 1 tablet (75 mg total) by mouth daily.  Dispense: 28 tablet; Refill: 0  2. Dysuria Chronic vulvar burning/dysuria.  Stable despite estrogen cream  or vaginal moisturizers, though we discussed she may need to be on these longer to see significant improvement.  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  I do think this  is contributory to her symptoms, however with her history of possible recurrent UTI I offered her cystoscopy for further evaluation and she agreed.  I continue to think she is more likely to be chronically colonized and having frequent UTIs.  Return in about 4 weeks (around 06/21/2022) for Cysto with Dr. Matilde Sprang.  Debroah Loop, PA-C  [facility] [place] [address], [place], [place] [address] [phone]

## 2022-06-20 ENCOUNTER — Ambulatory Visit (INDEPENDENT_AMBULATORY_CARE_PROVIDER_SITE_OTHER): Payer: Medicare Other | Admitting: Neurology

## 2022-06-20 ENCOUNTER — Telehealth: Payer: Self-pay | Admitting: Neurology

## 2022-06-20 VITALS — BP 112/72 | HR 82 | Ht 64.0 in

## 2022-06-20 DIAGNOSIS — E1142 Type 2 diabetes mellitus with diabetic polyneuropathy: Secondary | ICD-10-CM

## 2022-06-20 DIAGNOSIS — M6281 Muscle weakness (generalized): Secondary | ICD-10-CM

## 2022-06-20 DIAGNOSIS — R29898 Other symptoms and signs involving the musculoskeletal system: Secondary | ICD-10-CM

## 2022-06-20 DIAGNOSIS — I6789 Other cerebrovascular disease: Secondary | ICD-10-CM

## 2022-06-20 DIAGNOSIS — R0600 Dyspnea, unspecified: Secondary | ICD-10-CM

## 2022-06-20 DIAGNOSIS — I679 Cerebrovascular disease, unspecified: Secondary | ICD-10-CM | POA: Diagnosis not present

## 2022-06-20 DIAGNOSIS — R269 Unspecified abnormalities of gait and mobility: Secondary | ICD-10-CM | POA: Diagnosis not present

## 2022-06-20 NOTE — Telephone Encounter (Signed)
Patient prefer to have ECHO, US carotid done close to [place].

## 2022-06-20 NOTE — Procedures (Signed)
Full Name: Jalilah Wiltsie Gender: Female MRN #: [id number] Date of Birth: Aug 08, 1940    Visit Date: 06/20/2022 13:29 Age: 82 Years Examining Physician: Dr. Levert Feinstein Referring Physician: Dr. Levert Feinstein Height: 5 feet 4 inch History: 82 year old female presenting with progressive extra ophthalmoplegia, limb muscle difficulty, breathing difficulties, denies significant sensory loss  Summary of the test:  Nerve conduction study: Right sural sensory response showed normal distal latency with moderately decreased snap amplitude.  Right median sensory response showed mildly prolonged peak latency, moderately decreased snap amplitude.  Bilateral ulnar, radial sensory responses showed normal distal latency with mild to moderately decreased snap amplitude.  Right tibial and peroneal to EDB motor response showed significantly decreased CMAP amplitude.  Bilateral ulnar motor responses showed mildly prolonged distal latency, slight decreased CMAP amplitude, mildly prolonged F-wave latency.  Right median motor responses showed mildly prolonged distal latency, with mild slow conduction velocity  Electromyography: Selected needle examinations of right upper, lower extremity muscles, cervical and lumbosacral paraspinal muscles; right orbicularis oculi showed no significant abnormalities.   Conclusion: This is a mild abnormal study.  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  ------------------------------- Levert Feinstein, M.D. PhD  [facility] [address], [address] [place], [place] [address] Tel: [phone] Fax: [phone]  Verbal informed consent was obtained from the patient, patient was informed of potential risk of procedure, including bruising, bleeding, hematoma formation, infection, muscle weakness, muscle pain, numbness, among others.        MNC    Nerve / Sites  Muscle Latency Ref. Amplitude Ref. Rel Amp Segments Distance Velocity Ref. Area    ms ms mV mV %  cm m/s m/s mVms  R Median - APB     Wrist APB 4.8 ?4.4 4.6 ?4.0 100 Wrist - APB 7   15.9     Upper arm APB 11.0  4.0  87.6 Upper arm - Wrist 23 37 ?49 14.4  R Ulnar - ADM     Wrist ADM 4.3 ?3.3 5.5 ?6.0 100 Wrist - ADM 7   16.5     B.Elbow ADM 6.8  4.9  89.4 B.Elbow - Wrist 11.6 48 ?49 13.9     A.Elbow ADM 10.4  4.5  90.7 A.Elbow - B.Elbow 15 41 ?49 15.7  L Ulnar - ADM     Wrist ADM 4.1 ?3.3 6.3 ?6.0 100 Wrist - ADM 7   16.0     B.Elbow ADM 5.7  6.1  97.2 B.Elbow - Wrist 9 56 ?49 15.9     A.Elbow ADM 10.2  4.7  77.6 A.Elbow - B.Elbow 16 36 ?49 14.5  R Peroneal - EDB     Ankle EDB 8.2 ?6.5 0.3 ?2.0 100 Ankle - EDB 9   0.8     Fib head EDB 19.4  0.3  130 Fib head - Ankle 24.6 22 ?44      Pop fossa EDB 19.3  0.3  95.8 Pop fossa - Fib head 10 960 ?44 1.0         Pop fossa - Ankle      R Tibial - AH     Ankle AH 8.9 ?5.8 0.4 ?4.0 100 Ankle - AH 9   0.6     Pop fossa AH      Pop fossa - Ankle   ?41  SNC    Nerve / Sites Rec. Site Peak Lat Ref.  Amp Ref. Segments Distance    ms ms V V  cm  R Radial - Anatomical snuff box (Forearm)     Forearm Wrist 3.2 ?2.9 9 ?15 Forearm - Wrist 10  R Sural - Ankle (Calf)     Calf Ankle 3.5 ?4.4 3 ?6 Calf - Ankle 14  R Median - Orthodromic (Dig II, Mid palm)     Dig II Wrist 4.0 ?3.4 4 ?10 Dig II - Wrist 13  R Ulnar - Orthodromic, (Dig V, Mid palm)     Dig V Wrist 3.1 ?3.1 6 ?5 Dig V - Wrist 11  L Ulnar - Orthodromic, (Dig V, Mid palm)     Dig V Wrist 3.3 ?3.1 4 ?5 Dig V - Wrist 24                 F  Wave    Nerve F Lat Ref.   ms ms  R Ulnar - ADM 36.5 ?32.0  L Ulnar - ADM 32.8 ?32.0         EMG Summary Table    Spontaneous MUAP Recruitment  Muscle IA Fib PSW Fasc Other Amp Dur. Poly Pattern  R. Tibialis anterior Normal None None None _______ Normal Normal Normal Normal  R. Tibialis posterior Normal None None None _______ Normal  Normal Normal Normal  R. Peroneus longus Normal None None None _______ Normal Normal Normal Normal  R. Gastrocnemius (Medial head) Normal None None None _______ Normal Normal Normal Normal  R. Vastus lateralis Normal None None None _______ Normal Normal Normal Normal  R. First dorsal interosseous Normal None None None _______ Normal Normal Normal Normal  R. Pronator teres Normal None None None _______ Normal Normal Normal Normal  R. Biceps brachii Normal None None None _______ Normal Normal Normal Normal  R. Deltoid Normal None None None _______ Normal Normal Normal Normal  R. Triceps brachii Normal None None None _______ Normal Normal Normal Normal  R. Cervical paraspinals Normal None None None _______ Normal Normal Normal Normal  R. Orbicularis oculi Normal None None None _______ Normal Normal Normal Normal

## 2022-06-20 NOTE — Progress Notes (Signed)
Chief Complaint  Patient presents with   Procedure    Rm EMG/NCV 4.      ASSESSMENT AND PLAN  Angela David is a 82 y.o. female   Long history of gradual onset episode of breathing difficulty, swallowing difficulty, extra ophthalmoplegia, gait abnormality, Status post tracheostomy in 2012, PEG tube placement in 2022,  Significant extraocular movement abnormalities,  Consider mitochondrial disease,  Athena genetic testing for progressive external ophthalmoplegia evaluation was negative on February 14, 2021  EMG nerve conduction study showed no significant abnormality  MRI of cervical spine mild degenerative changes no evidence of significant canal or foraminal narrowing  MRI of brain showed extensive periventricular small vascular disease disease, proceed with echocardiogram, ultrasound of carotid arteryI called patient.  I discussed her MRI brain and cervical spine results.  I advised her that Dr. Terrace Arabia will review these MRIs with her at her next follow-up appointment in April.  Patient verbalized understanding and appreciation.  Patient had no further questions or concerns at this time.  DIAGNOSTIC DATA (LABS, IMAGING, TESTING) - I reviewed patient records, labs, notes, testing and imaging myself where available.  Addendum Angela David genetic testing for progressive external ophthalmoplegia evaluation was negative on February 14, 2021 MEDICAL HISTORY:  Angela David is a 82 year old female, seen in request by her primary care physician Dr. Lillie Fragmin for evaluation of worsening breathing difficulty, swallowing difficulty, she is companied by her husband and caregiver at today's visit April 24, 2022,  I reviewed and summarized the referring note. PMHX. DM PN Depression,  HLD Restless leg syndrome, Right breast cancer, s/p lobectomy, chemotherapy in 2002,  She began to have episode of difficulty breathing lasting for 30 second, then improve, initially was told to  have vocal cord paralysis, was diagnosis with laryngeal spasm, become more frequent, to the point of gasping for air, turning purple, eventually required tracheostomy in 2012, but no clear etiology found, she still uses ventilator at nighttime,  She then developed gradual onset gait abnormality, frequent fall since 2020, broke her ribs, developed pneumothorax in 2020, began to rely on walker, wheelchair in 2021  She also have gradual onset swallowing difficulty, eventually had PEG tube placement in 2022, herself did not notice any extraocular eye movement difficulties, but there was noticeable restriction of extraocular eye movement  She was seen by Dr. Anne Hahn since 2022, had Athena genetic testing for progressive external ophthalmoplegia evaluation was negative on February 14, 2021  UPDATE April 24th 2024: She return for electrodiagnostic study today, which showed no significant abnormalities, we also personally reviewed MRI of cervical spine, multilevel degenerative changes, no significant canal foraminal narrowing  MRI of cervical spine March 2024, severe periventricular small vessel disease  PHYSICAL EXAM:   Vitals:   06/20/22 1332  BP: 112/72  Pulse: 82  Height: 5\' 4"  (1.626 m)   Body mass index is 25.75 kg/m.  PHYSICAL EXAMNIATION:  Gen: NAD, conversant, well nourised, well groomed                     Cardiovascular: Regular rate rhythm, no peripheral edema, warm, nontender. Eyes: Conjunctivae clear without exudates or hemorrhage Neck: Limited range of motion, tendency of neck tilt to the left shoulder, status post tracheostomy Pulmonary: Clear to auscultation bilaterally   NEUROLOGICAL EXAM:  MENTAL STATUS: Speech/cognition: Awake, alert, oriented to history taking and casual conversation CRANIAL NERVES: CN II: Visual fields are full to confrontation. Pupils are round equal and briskly reactive to light. CN III,  IV, VI: Limited range of motion of extraocular muscle to  both horizontal and vertical direction. No ptosis. CN V: Facial sensation is intact to light touch CN VII: Face is symmetric with normal eye closure, mild cheek puff weakness, CN VIII: Hearing is normal to causal conversation. CN IX, X: Phonation is normal. CN XI: Head turning and shoulder shrug are intact CN XII: No tongue atrophy  MOTOR: Mild neck flexion, shoulder abduction, external rotation, bilateral hip flexion weakness  REFLEXES: Reflexes are trace and symmetric at the biceps, triceps, absent at knees, and ankles. Plantar responses are flexor.  SENSORY: Length-dependent decreased light touch, pinprick, vibratory sensation to knee level,  COORDINATION: There is no trunk or limb dysmetria noted.  GAIT/STANCE: Deferred  REVIEW OF SYSTEMS:  Full 14 system review of systems performed and notable only for as above All other review of systems were negative.   ALLERGIES: Allergies  Allergen Reactions   Shellfish Allergy Shortness Of Breath    respitatory distress    Sodium Sulfite Shortness Of Breath    Severe Congestion that creates inability to breath   Sulfa Antibiotics Shortness Of Breath and Other (See Comments)    respitatory distress   Codeine Other (See Comments)    Per patient "it kept her up all night"   Glucerna [Alimentum] Itching   Wound Dressing Adhesive Rash    HOME MEDICATIONS: Current Outpatient Medications  Medication Sig Dispense Refill   acetaminophen (TYLENOL) 325 MG tablet Place 2 tablets (650 mg total) into feeding tube every 6 (six) hours as needed for fever.     buPROPion (WELLBUTRIN SR) 150 MG 12 hr tablet Take 150 mg by mouth 2 (two) times daily.     Continuous Blood Gluc Sensor (FREESTYLE LIBRE 2 SENSOR) MISC 1 Device by Does not apply route daily. 30 each 1   denosumab (PROLIA) 60 MG/ML SOSY injection Inject 1 mL by subcutaneous route.     diphenhydrAMINE (BENADRYL) 25 mg capsule Take 1 capsule (25 mg total) by mouth every 6 (six) hours as  needed for allergies or itching. 30 capsule 0   EPINEPHrine 0.3 mg/0.3 mL IJ SOAJ injection Inject 0.3 mLs (0.3 mg dose) into the muscle once as needed for Anaphylaxis for up to 1 dose.     escitalopram (LEXAPRO) 20 MG tablet TAKE 1 TABLET VIA TUBE DAILY. TAKE ALONG WITH 10 MG DOSE.     estradiol (ESTRACE) 0.1 MG/GM vaginal cream Insert 1 applicatorful twice a week by vaginal route for 30 days.     ezetimibe (ZETIA) 10 MG tablet Take 10 mg by mouth daily.     folic acid (FOLVITE) 1 MG tablet Take 1 tablet by mouth daily.     gabapentin (NEURONTIN) 300 MG capsule Place 300 mg into feeding tube every 8 (eight) hours.     HUMALOG KWIKPEN 100 UNIT/ML KwikPen Inject into the skin.     hydrOXYzine (ATARAX) 25 MG tablet Place 25 mg into feeding tube 2 (two) times daily.     insulin glargine (LANTUS) 100 UNIT/ML Solostar Pen Inject 10 Units into the skin daily. 15 mL 11   Insulin Pen Needle (PEN NEEDLES 3/16") 31G X 5 MM MISC 1 Container by Does not apply route daily. 30 each 1   ipratropium-albuterol (DUONEB) 0.5-2.5 (3) MG/3ML SOLN Take 3 mLs by nebulization every 2 (two) hours as needed. 360 mL    loratadine (CLARITIN) 10 MG tablet Place 1 tablet (10 mg total) into feeding tube daily.  lovastatin (MEVACOR) 20 MG tablet Take 20 mg by mouth at bedtime.     meclizine (ANTIVERT) 12.5 MG tablet Take 12.5 mg by mouth 3 (three) times daily as needed for dizziness.     Melatonin 5 MG CAPS Take by mouth.     metoprolol tartrate (LOPRESSOR) 25 MG tablet Place 1 tablet (25 mg total) into feeding tube daily. (Patient taking differently: Place 12.5 mg into feeding tube 2 (two) times daily.)     Multiple Vitamin (MULTIVITAMIN) LIQD Place 15 mLs into feeding tube daily.     Nutritional Supplements (FEEDING SUPPLEMENT, KATE FARMS STANDARD 1.4,) LIQD liquid Place 1,440 mLs into feeding tube daily.     pantoprazole (PROTONIX) 40 MG tablet Take 40 mg by mouth daily.     Propylene Glycol (SYSTANE BALANCE) 0.6 % SOLN  Place 1 drop into both eyes 2 (two) times daily.     rOPINIRole (REQUIP) 0.5 MG tablet Take 0.5 mg by mouth at bedtime.     sodium chloride HYPERTONIC 3 % nebulizer solution Take 4 mLs by nebulization 2 (two) times daily. 750 mL 12   TOUJEO SOLOSTAR 300 UNIT/ML Solostar Pen Inject into the skin.     Vibegron (GEMTESA) 75 MG TABS Take 1 tablet (75 mg total) by mouth daily. 28 tablet 0   Water For Irrigation, Sterile (FREE WATER) SOLN Place 50 mLs into feeding tube every 4 (four) hours.     Zinc Oxide (BAZA PROTECT MOISTURE BARRIER) 12 % CREA Apply 1 application twice a day by topical route.     No current facility-administered medications for this visit.    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   GERD (gastroesophageal reflux disease)    Glaucoma    High cholesterol    History of colon polyps    Osteoarthritis    Personal history of chemotherapy 2002   BREAST CA   Personal history of radiation therapy 2002   BREAST CA   S/P percutaneous endoscopic gastrostomy (PEG) tube placement (HCC)    T2DM (type 2 diabetes mellitus) (HCC)    Tracheostomy dependent (HCC) 2012   Ventral hernia    Vocal cord paralysis 2012    PAST SURGICAL HISTORY: Past Surgical History:  Procedure Laterality Date   ABDOMINAL HYSTERECTOMY     BREAST BIOPSY Right 2008   neg   BREAST BIOPSY Right 06/30/2020   "Q" clip-path pending   BREAST EXCISIONAL BIOPSY Right 2002   positive   BREAST LUMPECTOMY Right 2002   chemo and radiation   BREAST LUMPECTOMY WITH RADIOFREQUENCY TAG IDENTIFICATION Right 10/18/2020   Procedure: BREAST LUMPECTOMY WITH RADIOFREQUENCY TAG IDENTIFICATION;  Surgeon: Leafy Ro, MD;  Location: [facility];  Service: General;  Laterality: Right;   BREAST  SURGERY Right 2002   LUMPECTOMY, radiation, chemotherapy   CATARACT EXTRACTION, BILATERAL     CHOLECYSTECTOMY N/A 12/10/2017   Procedure: LAPAROSCOPIC CHOLECYSTECTOMY;  Surgeon: Sung Amabile, DO;  Location: [facility];  Service: General;  Laterality: N/A;   COLON RESECTION Right 11/06/2018   Procedure: HAND ASSISTED LAPAROSCOPIC COLON RESECTION, RIGHT;  Surgeon: Leafy Ro, MD;  Location: [facility];  Service: General;  Laterality: Right;   COLONOSCOPY  2009,12/30/2013   COLONOSCOPY WITH PROPOFOL N/A 04/16/2018   Tubulovillous polyp proximal  transverse colon, large sessile polyp, proximal transverse colon;  Surgeon: Earline Mayotte, MD;  Location: [facility];  Service: Endoscopy;  Laterality: N/A;   ESOPHAGOGASTRODUODENOSCOPY (EGD) WITH PROPOFOL N/A 10/14/2018   Procedure: ESOPHAGOGASTRODUODENOSCOPY (EGD) WITH PROPOFOL;  Surgeon: Midge Minium, MD;  Location: [facility];  Service: Endoscopy;  Laterality: N/A;   IR GASTR TUBE CONVERT GASTR-JEJ PER W/FL MOD SED  01/10/2022   IR REPLACE G-TUBE SIMPLE WO FLUORO  11/29/2021   IR REPLC GASTRO/COLONIC TUBE PERCUT W/FLUORO  11/24/2021   TRACHEAL SURGERY  2013   TRACHEOSTOMY N/A    Dr Elenore Rota   XI ROBOTIC ASSISTED VENTRAL HERNIA N/A 10/18/2020   Procedure: XI ROBOTIC ASSISTED VENTRAL HERNIA;  Surgeon: Leafy Ro, MD;  Location: [facility];  Service: General;  Laterality: N/A;    FAMILY HISTORY: Family History  Problem Relation Age of Onset   Cancer Father        bone cancer   Cancer Sister        lymphoma   Cancer Brother    Breast cancer Neg Hx     SOCIAL HISTORY: Social History   Socioeconomic History   Marital status: Married    Spouse name: Jomarie Longs   Number of children: Not on file   Years of education: Not on file   Highest education level: Not on file  Occupational History   Not on file  Tobacco Use   Smoking status: Never    Passive exposure: Past   Smokeless tobacco: Never  Vaping Use   Vaping Use: Never used   Substance and Sexual Activity   Alcohol use: No   Drug use: No   Sexual activity: Not on file  Other Topics Concern   Not on file  Social History Narrative   Lives with husband   Right Handed   Drinks 2-3 cups caffeine daily   Social Determinants of Health   Financial Resource Strain: Not on file  Food Insecurity: Not on file  Transportation Needs: Not on file  Physical Activity: Not on file  Stress: Not on file  Social Connections: Not on file  Intimate Partner Violence: Not on file      Levert Feinstein, M.D. Ph.D.  [facility] [address] [place], [place] [address] Ph: [phone] Fax: [phone]  CC:  Jacalyn Lefevre, MD No address on file  Jacalyn Lefevre, MD

## 2022-06-21 NOTE — Telephone Encounter (Signed)
  Looks as though it has been placed for this to be done in [place]

## 2022-06-25 ENCOUNTER — Ambulatory Visit (INDEPENDENT_AMBULATORY_CARE_PROVIDER_SITE_OTHER): Payer: Medicare Other | Admitting: Urology

## 2022-06-25 VITALS — BP 118/84 | HR 81 | Ht 64.0 in | Wt 150.0 lb

## 2022-06-25 DIAGNOSIS — N3020 Other chronic cystitis without hematuria: Secondary | ICD-10-CM

## 2022-06-25 DIAGNOSIS — R350 Frequency of micturition: Secondary | ICD-10-CM

## 2022-06-25 MED ORDER — FOSFOMYCIN TROMETHAMINE 3 G PO PACK: 3.0000 g | PACK | Freq: Once | ORAL | 0 refills | Status: AC

## 2022-06-25 MED ORDER — NITROFURANTOIN MONOHYD MACRO 100 MG PO CAPS: 100.0000 mg | ORAL_CAPSULE | Freq: Every day | ORAL | 11 refills | Status: DC

## 2022-06-25 NOTE — Progress Notes (Signed)
06/25/2022 3:05 PM   Gretchen Short 07/29/1940 [id number]  Referring provider: Jacalyn Lefevre, MD No address on file  Chief Complaint  Patient presents with   Cysto    HPI: Dr Apolinar Junes: Retention following partial mastectomy and ventral hernia repair October 18, 2020.  2 days of constipation.  No past history of retention.  Last week residual was 226 mL and she could not void and was uncomfortable.  Today she had a trial of voiding.   Likely not infected.  Successful fill and pull voiding the same amount that was placed    Today Last saw the patient 2022.  She has been seen by nurse practitioner a few times.  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  Chronic vulvar burning with topical estrogen cream.  Uses nonhormonal moisturizers as well and has been on Myrbetriq.  Concerned about chronic burning.  Will switch to [place].  Her last 3 urine color which were positive she does have resistance patterns  Patient has frequency and suprapubic burning.  She is not a good historian her husband was very helpful.  They described her being septic in the hospital for 45 days but I did not see the discharge summary.  On pelvic examination she had a well supported bladder neck.  She had narrow introitus.  She had reddening of the labia  Cystoscopy: Patient underwent flexible cystoscopy.  She had very cloudy urine.  There is a lot of sediment and white flecks.  She a lot of pain with bladder filling.  She had diffuse erythema.  Patient is chronically infected.  She had a CT scan in December 2023.  Kidneys were normal.  No contrast given   PMH: Past Medical History:  Diagnosis Date   Breast cancer, right (HCC) 2002   Tx'd with chemotherapy and XRT   Chronic respiratory failure (HCC)    s/p tracheostomy in 2012; on ventilatory support at hs   Diabetic peripheral neuropathy (HCC) 08/28/2016   GERD  (gastroesophageal reflux disease)    Glaucoma    High cholesterol    History of colon polyps    Osteoarthritis    Personal history of chemotherapy 2002   BREAST CA   Personal history of radiation therapy 2002   BREAST CA   S/P percutaneous endoscopic gastrostomy (PEG) tube placement (HCC)    T2DM (type 2 diabetes mellitus) (HCC)    Tracheostomy dependent (HCC) 2012   Ventral hernia    Vocal cord paralysis 2012    Surgical History: Past Surgical History:  Procedure Laterality Date   ABDOMINAL HYSTERECTOMY     BREAST BIOPSY Right 2008   neg   BREAST BIOPSY Right 06/30/2020   "Q" clip-path pending   BREAST EXCISIONAL BIOPSY Right 2002   positive   BREAST LUMPECTOMY Right 2002   chemo and radiation   BREAST LUMPECTOMY WITH RADIOFREQUENCY TAG IDENTIFICATION Right 10/18/2020   Procedure: BREAST LUMPECTOMY WITH RADIOFREQUENCY TAG IDENTIFICATION;  Surgeon: Leafy Ro, MD;  Location: [facility];  Service: General;  Laterality: Right;   BREAST SURGERY Right 2002   LUMPECTOMY, radiation, chemotherapy   CATARACT EXTRACTION, BILATERAL     CHOLECYSTECTOMY N/A 12/10/2017   Procedure: LAPAROSCOPIC CHOLECYSTECTOMY;  Surgeon: Sung Amabile, DO;  Location: [facility];  Service: General;  Laterality: N/A;   COLON RESECTION Right 11/06/2018   Procedure: HAND ASSISTED LAPAROSCOPIC COLON RESECTION, RIGHT;  Surgeon: Leafy Ro, MD;  Location:  [facility];  Service: General;  Laterality: Right;   COLONOSCOPY  2009,12/30/2013   COLONOSCOPY WITH PROPOFOL N/A 04/16/2018   Tubulovillous polyp proximal transverse colon, large sessile polyp, proximal transverse colon;  Surgeon: Earline Mayotte, MD;  Location: [facility];  Service: Endoscopy;  Laterality: N/A;   ESOPHAGOGASTRODUODENOSCOPY (EGD) WITH PROPOFOL N/A 10/14/2018   Procedure: ESOPHAGOGASTRODUODENOSCOPY (EGD) WITH PROPOFOL;  Surgeon: Midge Minium, MD;  Location: [facility];  Service: Endoscopy;  Laterality: N/A;   IR GASTR TUBE CONVERT  GASTR-JEJ PER W/FL MOD SED  01/10/2022   IR REPLACE G-TUBE SIMPLE WO FLUORO  11/29/2021   IR REPLC GASTRO/COLONIC TUBE PERCUT W/FLUORO  11/24/2021   TRACHEAL SURGERY  2013   TRACHEOSTOMY N/A    Dr Elenore Rota   XI ROBOTIC ASSISTED VENTRAL HERNIA N/A 10/18/2020   Procedure: XI ROBOTIC ASSISTED VENTRAL HERNIA;  Surgeon: Leafy Ro, MD;  Location: [facility];  Service: General;  Laterality: N/A;    Home Medications:  Allergies as of 06/25/2022       Reactions   Shellfish Allergy Shortness Of Breath   respitatory distress    Sodium Sulfite Shortness Of Breath   Severe Congestion that creates inability to breath   Sulfa Antibiotics Shortness Of Breath, Other (See Comments)   respitatory distress   Codeine Other (See Comments)   Per patient "it kept her up all night"   Glucerna [alimentum] Itching   Wound Dressing Adhesive Rash        Medication List        Accurate as of June 25, 2022  3:05 PM. If you have any questions, ask your nurse or doctor.          acetaminophen 325 MG tablet Commonly known as: TYLENOL Place 2 tablets (650 mg total) into feeding tube every 6 (six) hours as needed for fever.   Baza Protect Moisture Barrier 12 % Crea Generic drug: Zinc Oxide Apply 1 application twice a day by topical route.   buPROPion 150 MG 12 hr tablet Commonly known as: WELLBUTRIN SR Take 150 mg by mouth 2 (two) times daily.   diphenhydrAMINE 25 mg capsule Commonly known as: BENADRYL Take 1 capsule (25 mg total) by mouth every 6 (six) hours as needed for allergies or itching.   EPINEPHrine 0.3 mg/0.3 mL Soaj injection Commonly known as: EPI-PEN Inject 0.3 mLs (0.3 mg dose) into the muscle once as needed for Anaphylaxis for up to 1 dose.   escitalopram 20 MG tablet Commonly known as: LEXAPRO TAKE 1 TABLET VIA TUBE DAILY. TAKE ALONG WITH 10 MG DOSE.   estradiol 0.1 MG/GM vaginal cream Commonly known as: ESTRACE Insert 1 applicatorful twice a week by vaginal route for 30  days.   ezetimibe 10 MG tablet Commonly known as: ZETIA Take 10 mg by mouth daily.   feeding supplement (KATE FARMS STANDARD 1.4) Liqd liquid Place 1,440 mLs into feeding tube daily.   folic acid 1 MG tablet Commonly known as: FOLVITE Take 1 tablet by mouth daily.   free water Soln Place 50 mLs into feeding tube every 4 (four) hours.   FreeStyle Libre 2 Sensor Misc 1 Device by Does not apply route daily.   gabapentin 300 MG capsule Commonly known as: NEURONTIN Place 300 mg into feeding tube every 8 (eight) hours.   Gemtesa 75 MG Tabs Generic drug: Vibegron Take 1 tablet (75 mg total) by mouth daily.   HumaLOG KwikPen 100 UNIT/ML KwikPen Generic drug: insulin lispro Inject into the skin.  hydrOXYzine 25 MG tablet Commonly known as: ATARAX Place 25 mg into feeding tube 2 (two) times daily.   insulin glargine 100 UNIT/ML Solostar Pen Commonly known as: LANTUS Inject 10 Units into the skin daily.   Toujeo SoloStar 300 UNIT/ML Solostar Pen Generic drug: insulin glargine (1 Unit Dial) Inject into the skin.   ipratropium-albuterol 0.5-2.5 (3) MG/3ML Soln Commonly known as: DUONEB Take 3 mLs by nebulization every 2 (two) hours as needed.   loratadine 10 MG tablet Commonly known as: CLARITIN Place 1 tablet (10 mg total) into feeding tube daily.   lovastatin 20 MG tablet Commonly known as: MEVACOR Take 20 mg by mouth at bedtime.   meclizine 12.5 MG tablet Commonly known as: ANTIVERT Take 12.5 mg by mouth 3 (three) times daily as needed for dizziness.   Melatonin 5 MG Caps Take by mouth.   metoprolol tartrate 25 MG tablet Commonly known as: LOPRESSOR Place 1 tablet (25 mg total) into feeding tube daily. What changed:  how much to take when to take this   multivitamin Liqd Place 15 mLs into feeding tube daily.   pantoprazole 40 MG tablet Commonly known as: PROTONIX Take 40 mg by mouth daily.   Pen Needles 3/16" 31G X 5 MM Misc 1 Container by Does not  apply route daily.   Prolia 60 MG/ML Sosy injection Generic drug: denosumab Inject 1 mL by subcutaneous route.   rOPINIRole 0.5 MG tablet Commonly known as: REQUIP Take 0.5 mg by mouth at bedtime.   sodium chloride HYPERTONIC 3 % nebulizer solution Take 4 mLs by nebulization 2 (two) times daily.   Systane Balance 0.6 % Soln Generic drug: Propylene Glycol Place 1 drop into both eyes 2 (two) times daily.        Allergies:  Allergies  Allergen Reactions   Shellfish Allergy Shortness Of Breath    respitatory distress    Sodium Sulfite Shortness Of Breath    Severe Congestion that creates inability to breath   Sulfa Antibiotics Shortness Of Breath and Other (See Comments)    respitatory distress   Codeine Other (See Comments)    Per patient "it kept her up all night"   Glucerna [Alimentum] Itching   Wound Dressing Adhesive Rash    Family History: Family History  Problem Relation Age of Onset   Cancer Father        bone cancer   Cancer Sister        lymphoma   Cancer Brother    Breast cancer Neg Hx     Social History:  reports that she has never smoked. She has been exposed to tobacco smoke. She has never used smokeless tobacco. She reports that she does not drink alcohol and does not use drugs.  ROS:                                        Physical Exam: There were no vitals taken for this visit.  Constitutional:  Alert and oriented, No acute distress. HEENT: [place] AT, moist mucus membranes.  Trachea midline, no masses.   Laboratory Data: Lab Results  Component Value Date   WBC 6.7 07/06/2021   HGB 11.1 (L) 07/06/2021   HCT 36.1 07/06/2021   MCV 88.7 07/06/2021   PLT 188 07/06/2021    Lab Results  Component Value Date   CREATININE 0.69 07/06/2021    No results found for: "  PSA"  No results found for: "TESTOSTERONE"  Lab Results  Component Value Date   HGBA1C 6.1 (H) 06/27/2021    Urinalysis    Component Value Date/Time    COLORURINE YELLOW (A) 06/26/2021 0056   APPEARANCEUR Cloudy (A) 04/12/2022 1448   LABSPEC >1.046 (H) 06/26/2021 0056   PHURINE 6.0 06/26/2021 0056   GLUCOSEU Negative 04/12/2022 1448   HGBUR NEGATIVE 06/26/2021 0056   BILIRUBINUR Negative 04/12/2022 1448   KETONESUR NEGATIVE 06/26/2021 0056   PROTEINUR 2+ (A) 04/12/2022 1448   PROTEINUR 30 (A) 06/26/2021 0056   UROBILINOGEN 2.0 (H) 11/18/2010 1535   NITRITE Negative 04/12/2022 1448   NITRITE NEGATIVE 06/26/2021 0056   LEUKOCYTESUR 3+ (A) 04/12/2022 1448   LEUKOCYTESUR LARGE (A) 06/26/2021 0056    Pertinent Imaging: Urine reviewed and sent for culture  Assessment & Plan: Patient has a challenging problem.  I will give her fosfomycin 3 g/day.  I will then place her on daily Macrodantin hopefully to try to prevent infections.  She has a very challenging problem.  I decided to refer her to infectious disease to see if they have any other suggestions  1. Urinary frequency  - Urinalysis, Complete   No follow-ups on file.  Martina Sinner, MD  [facility] [address], [address] [place], [place] [address] [phone]

## 2022-06-26 LAB — MICROSCOPIC EXAMINATION: WBC, UA: >30 /hpf — AB (ref 0–5)

## 2022-06-26 LAB — URINALYSIS, COMPLETE
Bilirubin, UA: NEGATIVE
Glucose, UA: NEGATIVE
Ketones, UA: NEGATIVE
Nitrite, UA: NEGATIVE
Specific Gravity, UA: 1.02 (ref 1.005–1.030)
Urobilinogen, Ur: 0.2 mg/dL (ref 0.2–1.0)
pH, UA: 5.5 (ref 5.0–7.5)

## 2022-06-28 LAB — CULTURE, URINE COMPREHENSIVE

## 2022-07-03 ENCOUNTER — Other Ambulatory Visit: Payer: Self-pay | Admitting: *Deleted

## 2022-07-03 MED ORDER — NITROFURANTOIN MONOHYD MACRO 100 MG PO CAPS: 100.0000 mg | ORAL_CAPSULE | Freq: Two times a day (BID) | ORAL | 0 refills | Status: AC

## 2022-07-12 ENCOUNTER — Ambulatory Visit: Payer: Medicare Other | Attending: Infectious Diseases | Admitting: Infectious Diseases

## 2022-07-12 ENCOUNTER — Encounter: Payer: Self-pay | Admitting: Infectious Diseases

## 2022-07-12 ENCOUNTER — Emergency Department
Admission: EM | Admit: 2022-07-12 | Discharge: 2022-07-13 | Disposition: A | Discharge status: Home / Self Care | Payer: Medicare Other | Attending: Emergency Medicine | Admitting: Emergency Medicine

## 2022-07-12 VITALS — BP 98/65 | HR 90 | Temp 98.0°F

## 2022-07-12 DIAGNOSIS — Z20822 Contact with and (suspected) exposure to covid-19: Secondary | ICD-10-CM | POA: Diagnosis not present

## 2022-07-12 DIAGNOSIS — K219 Gastro-esophageal reflux disease without esophagitis: Secondary | ICD-10-CM | POA: Diagnosis not present

## 2022-07-12 DIAGNOSIS — Z22358 Carrier of other enterobacterales: Secondary | ICD-10-CM

## 2022-07-12 DIAGNOSIS — Z780 Asymptomatic menopausal state: Secondary | ICD-10-CM | POA: Diagnosis not present

## 2022-07-12 DIAGNOSIS — J9801 Acute bronchospasm: Secondary | ICD-10-CM

## 2022-07-12 DIAGNOSIS — Z853 Personal history of malignant neoplasm of breast: Secondary | ICD-10-CM | POA: Insufficient documentation

## 2022-07-12 DIAGNOSIS — J9610 Chronic respiratory failure, unspecified whether with hypoxia or hypercapnia: Secondary | ICD-10-CM | POA: Insufficient documentation

## 2022-07-12 DIAGNOSIS — R300 Dysuria: Secondary | ICD-10-CM | POA: Diagnosis present

## 2022-07-12 DIAGNOSIS — Z931 Gastrostomy status: Secondary | ICD-10-CM | POA: Diagnosis not present

## 2022-07-12 DIAGNOSIS — Z9049 Acquired absence of other specified parts of digestive tract: Secondary | ICD-10-CM | POA: Insufficient documentation

## 2022-07-12 DIAGNOSIS — R0602 Shortness of breath: Secondary | ICD-10-CM | POA: Diagnosis present

## 2022-07-12 DIAGNOSIS — N958 Other specified menopausal and perimenopausal disorders: Secondary | ICD-10-CM | POA: Diagnosis not present

## 2022-07-12 DIAGNOSIS — Z930 Tracheostomy status: Secondary | ICD-10-CM | POA: Insufficient documentation

## 2022-07-12 DIAGNOSIS — Z794 Long term (current) use of insulin: Secondary | ICD-10-CM | POA: Insufficient documentation

## 2022-07-12 DIAGNOSIS — E785 Hyperlipidemia, unspecified: Secondary | ICD-10-CM | POA: Diagnosis not present

## 2022-07-12 DIAGNOSIS — E119 Type 2 diabetes mellitus without complications: Secondary | ICD-10-CM | POA: Diagnosis not present

## 2022-07-12 DIAGNOSIS — Z79899 Other long term (current) drug therapy: Secondary | ICD-10-CM | POA: Insufficient documentation

## 2022-07-12 DIAGNOSIS — Z923 Personal history of irradiation: Secondary | ICD-10-CM | POA: Insufficient documentation

## 2022-07-12 DIAGNOSIS — Z7401 Bed confinement status: Secondary | ICD-10-CM | POA: Diagnosis not present

## 2022-07-12 DIAGNOSIS — Z87440 Personal history of urinary (tract) infections: Secondary | ICD-10-CM | POA: Insufficient documentation

## 2022-07-12 MED ORDER — IPRATROPIUM-ALBUTEROL 0.5-2.5 (3) MG/3ML IN SOLN
3.0000 mL | Freq: Once | RESPIRATORY_TRACT | Status: AC
  Administered 2022-07-13: 3 mL via RESPIRATORY_TRACT
  Filled 2022-07-12: qty 3

## 2022-07-12 NOTE — ED Triage Notes (Signed)
Pt has trach sine 2012 due to vocal cord problems. Pt has atrophy muscles and wheelchair bound. Shiley trach. She developed SOB and tried to trouble shoot it with clear it and unsuccessful. Pt's RA sats 82. Now on 15 L and sating fine.

## 2022-07-12 NOTE — Patient Instructions (Addendum)
Please follow the steps below   *Estrace /Premarin cream topically- peasized apply topically three times a week * Cetaphil to clean the genital area ( not soap)  * Cranberry supplement (-Knudsen cranberry concentrate- 1 ounce mixed with 8 ounces of water *wash with water after bowel movt *Probiotic for vaginal health( can try Pearls vaginal health) * Increase water consumption- 8 glasses a day *Avoid antibiotics unless systemic infection/ or before cystoscopy * Kegel Exercise to strengthen pelvic floor

## 2022-07-12 NOTE — ED Notes (Signed)
Hector with RT is at bedside.

## 2022-07-12 NOTE — Progress Notes (Signed)
NAME: Angela David  DOB: 08-Jan-1941  MRN: [id number]  Date/Time: 07/12/2022 11:04 AM   Subjective:   ? Angela David is a 82 y.o. female with a history of ductal carcinoma RT breast, s/p lumpectomy in 2002  tracheal stenosis, vocal cord paralysis  chronic resp failure s/p tracheostomy in 2012, PEG, T2DM, HLD, GERD , prolonged hospitalization Jan 2023  for influenza A resp illness, with resp failure, at [facility] and sent to [facility] on 03/09/21 and stayed there till 06/25/21 and got readmitted to [facility] 06/26/21 for resp failure, UTI And was DC home on 07/06/21, she has had poor mobility with being bed bound most of the time since then She has also developed dysuria since then and has bene treated with multiple courses of antibiotics- She  has had ESBL klebsiella cultured multiple times from urine. She saw urologist in Am[place] 20204 and diagnosed with GUS of menopause and at that time she was already taking estrace cream vaginally. She saw Dr.Macdiarmid on 06/25/22 .He placed her on nitrofurantoin  after a dose of fosfomycin and sent her to me Pt has not had any significant relief from Nitrofurantoin  Her main issue seems to be not having proper personal hygiene because of not able to walk and also not having had a shower but just bird bath as per her husband for the past many months Using diapers  Past Medical History:  Diagnosis Date   Breast cancer, right (HCC) 2002   Tx'd with chemotherapy and XRT   Chronic respiratory failure (HCC)    s/p tracheostomy in 2012; on ventilatory support at hs   Diabetic peripheral neuropathy (HCC) 08/28/2016   GERD (gastroesophageal reflux disease)    Glaucoma    High cholesterol    History of colon polyps    Osteoarthritis    Personal history of chemotherapy 2002   BREAST CA   Personal history of radiation therapy 2002   BREAST CA   S/P percutaneous endoscopic gastrostomy (PEG) tube placement (HCC)    T2DM (type 2 diabetes mellitus) (HCC)     Tracheostomy dependent (HCC) 2012   Ventral hernia    Vocal cord paralysis 2012    Past Surgical History:  Procedure Laterality Date   ABDOMINAL HYSTERECTOMY     BREAST BIOPSY Right 2008   neg   BREAST BIOPSY Right 06/30/2020   "Q" clip-path pending   BREAST EXCISIONAL BIOPSY Right 2002   positive   BREAST LUMPECTOMY Right 2002   chemo and radiation   BREAST LUMPECTOMY WITH RADIOFREQUENCY TAG IDENTIFICATION Right 10/18/2020   Procedure: BREAST LUMPECTOMY WITH RADIOFREQUENCY TAG IDENTIFICATION;  Surgeon: Leafy Ro, MD;  Location: [facility];  Service: General;  Laterality: Right;   BREAST SURGERY Right 2002   LUMPECTOMY, radiation, chemotherapy   CATARACT EXTRACTION, BILATERAL     CHOLECYSTECTOMY N/A 12/10/2017   Procedure: LAPAROSCOPIC CHOLECYSTECTOMY;  Surgeon: Sung Amabile, DO;  Location: [facility];  Service: General;  Laterality: N/A;   COLON RESECTION Right 11/06/2018   Procedure: HAND ASSISTED LAPAROSCOPIC COLON RESECTION, RIGHT;  Surgeon: Leafy Ro, MD;  Location: [facility];  Service: General;  Laterality: Right;   COLONOSCOPY  2009,12/30/2013   COLONOSCOPY WITH PROPOFOL N/A 04/16/2018   Tubulovillous polyp proximal transverse colon, large sessile polyp, proximal transverse colon;  Surgeon: Earline Mayotte, MD;  Location: [facility];  Service: Endoscopy;  Laterality: N/A;   ESOPHAGOGASTRODUODENOSCOPY (EGD) WITH PROPOFOL N/A 10/14/2018   Procedure: ESOPHAGOGASTRODUODENOSCOPY (EGD) WITH PROPOFOL;  Surgeon: Midge Minium, MD;  Location: [facility];  Service: Endoscopy;  Laterality: N/A;   IR GASTR TUBE CONVERT GASTR-JEJ PER W/FL MOD SED  01/10/2022   IR REPLACE G-TUBE SIMPLE WO FLUORO  11/29/2021   IR REPLC GASTRO/COLONIC TUBE PERCUT W/FLUORO  11/24/2021   TRACHEAL SURGERY  2013   TRACHEOSTOMY N/A    Dr Elenore Rota   XI ROBOTIC ASSISTED VENTRAL HERNIA N/A 10/18/2020   Procedure: XI ROBOTIC ASSISTED VENTRAL HERNIA;  Surgeon: Leafy Ro, MD;  Location: [facility];   Service: General;  Laterality: N/A;    Social History   Socioeconomic History   Marital status: Married    Spouse name: Jomarie Longs   Number of children: Not on file   Years of education: Not on file   Highest education level: Not on file  Occupational History   Not on file  Tobacco Use   Smoking status: Never    Passive exposure: Past   Smokeless tobacco: Never  Vaping Use   Vaping Use: Never used  Substance and Sexual Activity   Alcohol use: No   Drug use: No   Sexual activity: Not on file  Other Topics Concern   Not on file  Social History Narrative   Lives with husband   Right Handed   Drinks 2-3 cups caffeine daily   Social Determinants of Health   Financial Resource Strain: Not on file  Food Insecurity: Not on file  Transportation Needs: Not on file  Physical Activity: Not on file  Stress: Not on file  Social Connections: Not on file  Intimate Partner Violence: Not on file    Family History  Problem Relation Age of Onset   Cancer Father        bone cancer   Cancer Sister        lymphoma   Cancer Brother    Breast cancer Neg Hx    Allergies  Allergen Reactions   Shellfish Allergy Shortness Of Breath    respitatory distress    Sodium Sulfite Shortness Of Breath    Severe Congestion that creates inability to breath   Sulfa Antibiotics Shortness Of Breath and Other (See Comments)    respitatory distress   Codeine Other (See Comments)    Per patient "it kept her up all night"   Glucerna [Alimentum] Itching   Wound Dressing Adhesive Rash   I? Current Outpatient Medications  Medication Sig Dispense Refill   acetaminophen (TYLENOL) 325 MG tablet Place 2 tablets (650 mg total) into feeding tube every 6 (six) hours as needed for fever.     buPROPion (WELLBUTRIN SR) 150 MG 12 hr tablet Take 150 mg by mouth 2 (two) times daily.     Continuous Blood Gluc Sensor (FREESTYLE LIBRE 2 SENSOR) MISC 1 Device by Does not apply route daily. 30 each 1   denosumab (PROLIA)  60 MG/ML SOSY injection Inject 1 mL by subcutaneous route.     diphenhydrAMINE (BENADRYL) 25 mg capsule Take 1 capsule (25 mg total) by mouth every 6 (six) hours as needed for allergies or itching. 30 capsule 0   EPINEPHrine 0.3 mg/0.3 mL IJ SOAJ injection Inject 0.3 mLs (0.3 mg dose) into the muscle once as needed for Anaphylaxis for up to 1 dose.     escitalopram (LEXAPRO) 20 MG tablet TAKE 1 TABLET VIA TUBE DAILY. TAKE ALONG WITH 10 MG DOSE.     estradiol (ESTRACE) 0.1 MG/GM vaginal cream Insert 1 applicatorful twice a week by vaginal route for 30 days.  ezetimibe (ZETIA) 10 MG tablet Take 10 mg by mouth daily.     folic acid (FOLVITE) 1 MG tablet Take 1 tablet by mouth daily.     gabapentin (NEURONTIN) 300 MG capsule Place 300 mg into feeding tube every 8 (eight) hours.     HUMALOG KWIKPEN 100 UNIT/ML KwikPen Inject into the skin.     hydrOXYzine (ATARAX) 25 MG tablet Place 25 mg into feeding tube 2 (two) times daily.     insulin glargine (LANTUS) 100 UNIT/ML Solostar Pen Inject 10 Units into the skin daily. 15 mL 11   Insulin Pen Needle (PEN NEEDLES 3/16") 31G X 5 MM MISC 1 Container by Does not apply route daily. 30 each 1   ipratropium-albuterol (DUONEB) 0.5-2.5 (3) MG/3ML SOLN Take 3 mLs by nebulization every 2 (two) hours as needed. 360 mL    loratadine (CLARITIN) 10 MG tablet Place 1 tablet (10 mg total) into feeding tube daily.     lovastatin (MEVACOR) 20 MG tablet Take 20 mg by mouth at bedtime.     meclizine (ANTIVERT) 12.5 MG tablet Take 12.5 mg by mouth 3 (three) times daily as needed for dizziness.     Melatonin 5 MG CAPS Take by mouth.     metoprolol tartrate (LOPRESSOR) 25 MG tablet Place 1 tablet (25 mg total) into feeding tube daily. (Patient taking differently: Place 12.5 mg into feeding tube 2 (two) times daily.)     Multiple Vitamin (MULTIVITAMIN) LIQD Place 15 mLs into feeding tube daily.     nitrofurantoin, macrocrystal-monohydrate, (MACROBID) 100 MG capsule Take 1  capsule (100 mg total) by mouth daily. 30 capsule 11   Nutritional Supplements (FEEDING SUPPLEMENT, KATE FARMS STANDARD 1.4,) LIQD liquid Place 1,440 mLs into feeding tube daily.     pantoprazole (PROTONIX) 40 MG tablet Take 40 mg by mouth daily.     Propylene Glycol (SYSTANE BALANCE) 0.6 % SOLN Place 1 drop into both eyes 2 (two) times daily.     rOPINIRole (REQUIP) 0.5 MG tablet Take 0.5 mg by mouth at bedtime.     sodium chloride HYPERTONIC 3 % nebulizer solution Take 4 mLs by nebulization 2 (two) times daily. 750 mL 12   TOUJEO SOLOSTAR 300 UNIT/ML Solostar Pen Inject into the skin.     Vibegron (GEMTESA) 75 MG TABS Take 1 tablet (75 mg total) by mouth daily. 28 tablet 0   Water For Irrigation, Sterile (FREE WATER) SOLN Place 50 mLs into feeding tube every 4 (four) hours.     Zinc Oxide (BAZA PROTECT MOISTURE BARRIER) 12 % CREA Apply 1 application twice a day by topical route.     No current facility-administered medications for this visit.     Abtx:  Anti-infectives (From admission, onward)    None       REVIEW OF SYSTEMS:  Const: negative fever, negative chills, some weight loss Eyes: negative diplopia or visual changes, negative eye pain ENT: negative coryza, negative sore throat Resp: negative cough, hemoptysis, dyspnea Cards: negative for chest pain, palpitations, lower extremity edema GU:  frequency, dysuria  GI: Negative for abdominal pain, diarrhea, bleeding, constipation Skin: negative for rash and pruritus Heme: negative for easy bruising and gum/nose bleeding MS:  muscle weakness Neurolo:weakness legs Psych: negative for feelings of anxiety, depression  Endocrine: diabetes Allergy/Immunology- many including sulfa Objective:  VITALS:  BP 98/65   Pulse 90   Temp 98 F (36.7 C) (Temporal)   PHYSICAL EXAM:  General: wheel chair Alert, cooperative, no distress, frail,  pale Head: Normocephalic, without obvious abnormality, atraumatic. Eyes: Conjunctivae clear,  anicteric sclerae. Pupils are equal ENT Nares normal. No drainage or sinus tenderness. Lips, mucosa, and tongue normal. No Thrush Neck: tracheostomy no carotid bruit and no JVD. Lungs b/l air entry Heart: s1s2 Abdomen: Soft, PEG in place covered with dressing Extremities: atraumatic, no cyanosis. No edema. No clubbing Skin: No rashes or lesions. Or bruising Lymph: Cervical, supraclavicular normal. Neurologic: did not examine in detail Pertinent Labs none  Microbiology: 06/25/22- UC- ESBL klebsiella   ? Impression/Recommendation 82 y.o. female with a history of ductal carcinoma RT breast, s/p lumpectomy in 2002  tracheal stenosis, vocal cord paralysis  chronic resp failure s/p tracheostomy since 2012, PEG, T2DM, HLD, GERD , prolonged hospitalization Jan 2023  for influenza A resp illness, with resp failure, at [facility] and sent to [facility] on 03/09/21 and stayed there till 06/25/21 and got readmitted to [facility] 06/26/21 for resp failure, UTI And was DC home on 07/06/21, she has had poor mobility with being bed bound most of the time since then ? ? 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 compounded by frequent antibiotics and possible candida vaginitis, and poor genital hygiene due to bed bound status Discussed in detail with patient and husband and to follow the  protocol noted below  for preventing UTI  *Estrace /Premarin cream topically- peasized apply topically three times a week ( she has been using it for a long time- ideally need to be cleared by her oncologist as she has had breast ca) * Cetaphil to clean the genital area ( not soap)  * Cranberry supplement (-Knudsen cranberry concentrate- 1 ounce mixed with 8 ounces of water *wash with water after bowel movt *Probiotic for vaginal health( can try Pearls vaginal health) * Increase  water consumption- 8 glasses a day *Ask your doctors not to check your urine on a routine basis  *Avoid antibiotics unless systemic infection/ or before cystoscopy * Kegel Exercise to strengthen pelvic floor  Would avoid Long term nitrofurantoin for risk of lung injury Told patient and husband to stop it a week into the above treatment  Will have a phone visit in 2 weeks to discuss progress ______________________________________________

## 2022-07-13 ENCOUNTER — Emergency Department: Payer: Medicare Other

## 2022-07-13 DIAGNOSIS — J9801 Acute bronchospasm: Secondary | ICD-10-CM | POA: Diagnosis not present

## 2022-07-13 LAB — COMPREHENSIVE METABOLIC PANEL
ALT: 27 U/L (ref 0–44)
AST: 46 U/L — ABNORMAL HIGH (ref 15–41)
Albumin: 4.1 g/dL (ref 3.5–5.0)
Alkaline Phosphatase: 93 U/L (ref 38–126)
Anion gap: 10 (ref 5–15)
BUN: 35 mg/dL — ABNORMAL HIGH (ref 8–23)
CO2: 22 mmol/L (ref 22–32)
Calcium: 9.4 mg/dL (ref 8.9–10.3)
Chloride: 103 mmol/L (ref 98–111)
Creatinine, Ser: 1.03 mg/dL — ABNORMAL HIGH (ref 0.44–1.00)
GFR, Estimated: 55 mL/min — ABNORMAL LOW (ref >60)
Glucose, Bld: 220 mg/dL — ABNORMAL HIGH (ref 70–99)
Potassium: 4.2 mmol/L (ref 3.5–5.1)
Sodium: 135 mmol/L (ref 135–145)
Total Bilirubin: 0.8 mg/dL (ref 0.3–1.2)
Total Protein: 8 g/dL (ref 6.5–8.1)

## 2022-07-13 LAB — CBC WITH DIFFERENTIAL/PLATELET
Abs Immature Granulocytes: 0.08 10*3/uL — ABNORMAL HIGH (ref 0.00–0.07)
Basophils Absolute: 0.1 10*3/uL (ref 0.0–0.1)
Basophils Relative: 1 %
Eosinophils Absolute: 0.2 10*3/uL (ref 0.0–0.5)
Eosinophils Relative: 2 %
HCT: 45.2 % (ref 36.0–46.0)
Hemoglobin: 14.8 g/dL (ref 12.0–15.0)
Immature Granulocytes: 1 %
Lymphocytes Relative: 16 %
Lymphs Abs: 2 10*3/uL (ref 0.7–4.0)
MCH: 29.1 pg (ref 26.0–34.0)
MCHC: 32.7 g/dL (ref 30.0–36.0)
MCV: 89 fL (ref 80.0–100.0)
Monocytes Absolute: 0.6 10*3/uL (ref 0.1–1.0)
Monocytes Relative: 5 %
Neutro Abs: 9.6 10*3/uL — ABNORMAL HIGH (ref 1.7–7.7)
Neutrophils Relative %: 75 %
Platelets: 194 10*3/uL (ref 150–400)
RBC: 5.08 MIL/uL (ref 3.87–5.11)
RDW: 12.1 % (ref 11.5–15.5)
WBC: 12.5 10*3/uL — ABNORMAL HIGH (ref 4.0–10.5)
nRBC: 0 % (ref 0.0–0.2)

## 2022-07-13 LAB — PROCALCITONIN: Procalcitonin: <0.1 ng/mL

## 2022-07-13 LAB — BRAIN NATRIURETIC PEPTIDE: B Natriuretic Peptide: 162.8 pg/mL — ABNORMAL HIGH (ref 0.0–100.0)

## 2022-07-13 LAB — SARS CORONAVIRUS 2 BY RT PCR: SARS Coronavirus 2 by RT PCR: NEGATIVE

## 2022-07-13 LAB — TROPONIN I (HIGH SENSITIVITY): Troponin I (High Sensitivity): 15 ng/L (ref <18)

## 2022-07-13 MED ORDER — IOHEXOL 350 MG/ML SOLN
100.0000 mL | Freq: Once | INTRAVENOUS | Status: AC | PRN
  Administered 2022-07-13: 100 mL via INTRAVENOUS

## 2022-07-13 MED ORDER — DEXAMETHASONE SODIUM PHOSPHATE 10 MG/ML IJ SOLN
10.0000 mg | Freq: Once | INTRAMUSCULAR | Status: AC
  Administered 2022-07-13: 10 mg via INTRAVENOUS
  Filled 2022-07-13: qty 1

## 2022-07-13 MED ORDER — IPRATROPIUM-ALBUTEROL 0.5-2.5 (3) MG/3ML IN SOLN
3.0000 mL | Freq: Once | RESPIRATORY_TRACT | Status: AC
  Administered 2022-07-13: 3 mL via RESPIRATORY_TRACT
  Filled 2022-07-13: qty 3

## 2022-07-13 NOTE — ED Notes (Signed)
PT transported to CT by this RN. Tolerated well.

## 2022-07-13 NOTE — Progress Notes (Signed)
Patient was assessed for possible mucus plug. Patient was set up on a 28% ATC with humidity, then lavaged and suctioned twice with a catheter. Patient has a strong dry non productive cough upon suction, but when lavaged was able to mobilize a scant thick green amount of mucus, reporting afterwards relief and improvement in WOB. SpO2 94%, MD at bedside.

## 2022-07-13 NOTE — Discharge Instructions (Signed)
Continue to use a humidifier with your ventilator as well as throughout the day.  Use breathing treatments as needed for shortness of breath.  Try to drink plenty of fluids.

## 2022-07-13 NOTE — ED Provider Notes (Signed)
[facility] Provider Note    Event Date/Time   First MD Initiated Contact with Patient 07/12/22 2339     (approximate)   History   Shortness of Breath   HPI  Angela David is a 82 y.o. female here with shortness of breath.  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  She reports that she has had increasing secretions that have been increasingly thick over the last several days.  Earlier today, she felt like she could not get the secretion up so she called EMS.  She was significantly hypoxic.  She has since had a small amount of production and feels somewhat better although she remains on oxygen.  This is not her baseline.  Denies any fevers or chills.  Denies any drainage or pain around her tracheostomy site.     Physical Exam   Triage Vital Signs: ED Triage Vitals  Enc Vitals Group     BP 07/12/22 2330 116/67     Pulse Rate 07/12/22 2330 98     Resp 07/12/22 2330 (!) 23     Temp 07/12/22 2330 98.3 F (36.8 C)     Temp Source 07/12/22 2330 Oral     SpO2 07/12/22 2330 98 %     Weight --      Height --      Head Circumference --      Peak Flow --      Pain Score 07/12/22 2327 0     Pain Loc --      Pain Edu? --      Excl. in GC? --     Most recent vital signs: Vitals:   07/13/22 0100 07/13/22 0130  BP: 110/73 (!) 115/46  Pulse: 89 92  Resp: (!) 22 16  Temp:    SpO2: 96% 96%     General: Awake, no distress.  CV:  Good peripheral perfusion.  Regular rate and rhythm. Resp:  Normal work of breathing.  Transmitted upper airway sounds.  Coarse bilateral rhonchi. Abd:  No distention.  No tenderness. Other:  No apparent distress.   ED Results / Procedures / Treatments   Labs (all labs ordered are listed, but only abnormal results are displayed) Labs Reviewed  CBC WITH DIFFERENTIAL/PLATELET - Abnormal; Notable for the following components:      Result Value   WBC 12.5 (*)    Neutro Abs 9.6  (*)    Abs Immature Granulocytes 0.08 (*)    All other components within normal limits  COMPREHENSIVE METABOLIC PANEL - Abnormal; Notable for the following components:   Glucose, Bld 220 (*)    BUN 35 (*)    Creatinine, Ser 1.03 (*)    AST 46 (*)    GFR, Estimated 55 (*)    All other components within normal limits  BRAIN NATRIURETIC PEPTIDE - Abnormal; Notable for the following components:   B Natriuretic Peptide 162.8 (*)    All other components within normal limits  SARS CORONAVIRUS 2 BY RT PCR  PROCALCITONIN  TROPONIN I (HIGH SENSITIVITY)     EKG Sinus tachycardia, ventricular rate 100.  PR 204, QRS 91, QTc 475.  No acute ST elevations or depressions.  Acute respite acute ischemia or infarct.   RADIOLOGY Chest x-ray: Normal CT angio PE: No evidence of PE, elevated right hemidiaphragm, no acute consolidation or abnormality   I also independently reviewed and agree with radiologist interpretations.   PROCEDURES:  Critical Care  performed: No  .1-3 Lead EKG Interpretation  Performed by: Shaune Pollack, MD Authorized by: Shaune Pollack, MD     Interpretation: normal     ECG rate:  80-100   ECG rate assessment: normal     Rhythm: sinus rhythm     Ectopy: none     Conduction: normal   Comments:     Indication: SOB      MEDICATIONS ORDERED IN ED: Medications  ipratropium-albuterol (DUONEB) 0.5-2.5 (3) MG/3ML nebulizer solution 3 mL (has no administration in time range)  ipratropium-albuterol (DUONEB) 0.5-2.5 (3) MG/3ML nebulizer solution 3 mL (3 mLs Nebulization Given 07/13/22 0043)  iohexol (OMNIPAQUE) 350 MG/ML injection 100 mL (100 mLs Intravenous Contrast Given 07/13/22 0112)  dexamethasone (DECADRON) injection 10 mg (10 mg Intravenous Given 07/13/22 0235)     IMPRESSION / MDM / ASSESSMENT AND PLAN / ED COURSE  I reviewed the triage vital signs and the nursing notes.                              Differential diagnosis includes, but is not limited to,  PNA, tracheitis, mucus plugging, CHF, anemia, ACS  Patient's presentation is most consistent with acute presentation with potential threat to life or bodily function.  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  Shortness of breath is now resolved after a small mucous plug was removed.  Suspect this is etiology of her shortness of breath.  She had some mild hypoxia shortly after that resolved over time.  She was given a breathing treatment.  I suspect she may have a component of bronchospasm and she was given a dose of Decadron as well.  She was monitored for several hours and remained satting greater than 90% on room air.  She does use a ventilator at night and I encouraged use of a humidifier.  Otherwise, CT angio was obtained given the acuity of onset hypoxia and shows no evidence of acute abnormality.  There is no evidence of pneumonia.  CBC and CMP are unremarkable.  Mild leukocytosis like reactive and she has no fevers.  Procalcitonin negative.  EKG nonischemic and troponins at baseline.  Will discharge with outpatient follow-up.    FINAL CLINICAL IMPRESSION(S) / ED DIAGNOSES   Final diagnoses:  Bronchospasm     Rx / DC Orders   ED Discharge Orders     None        Note:  This document was prepared using Dragon voice recognition software and may include unintentional dictation errors.   Shaune Pollack, MD 07/13/22 [phone]

## 2022-07-21 IMAGING — US US BREAST*R* LIMITED INC AXILLA
1 series · 6 of 6 positions shown · non-contrast
Comparison: Prior films

CLINICAL DATA: Personal history of right breast cancer status post
lumpectomy 3113. Patient complains of a palpable mass right breast

EXAM:
DIGITAL DIAGNOSTIC BILATERAL MAMMOGRAM WITH TOMOSYNTHESIS AND CAD;
ULTRASOUND RIGHT BREAST LIMITED
TECHNIQUE: Bilateral digital diagnostic mammography and breast tomosynthesis
was performed. The images were evaluated with computer-aided
detection.; Targeted ultrasound examination of the right breast was
performed

[Series 1: us breast*right* limited inc axilla · 0.06mm/px · 6 of 6 slices shown]
[im 1/6]
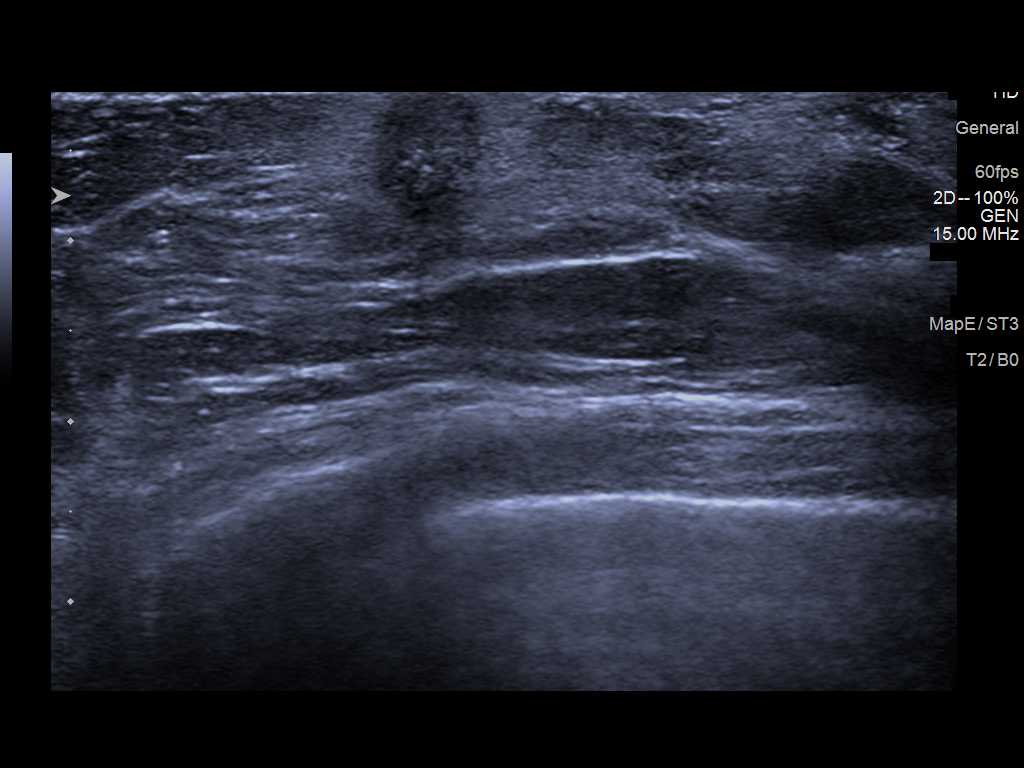
[im 2/6]
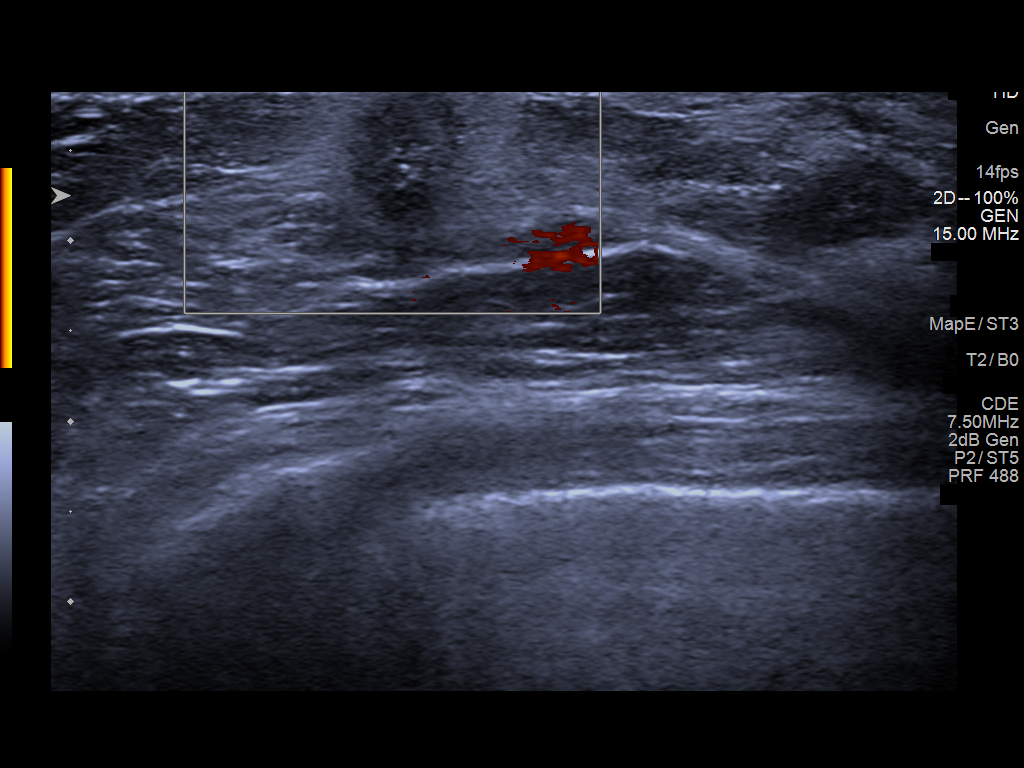
[im 3/6]
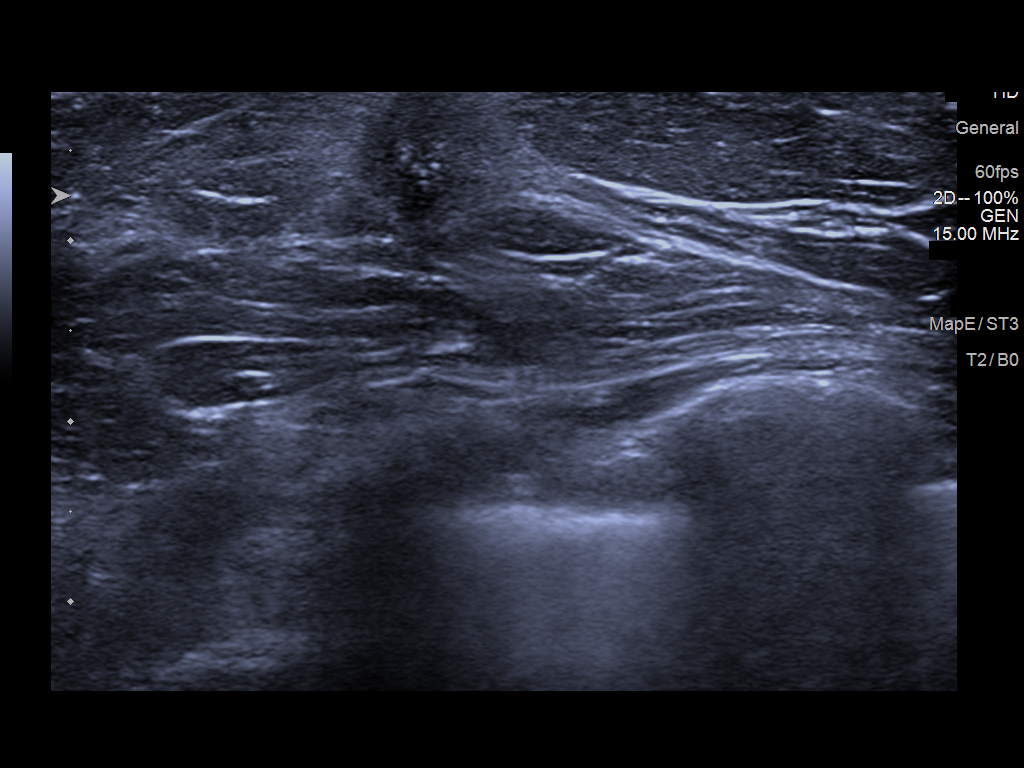
[im 4/6]
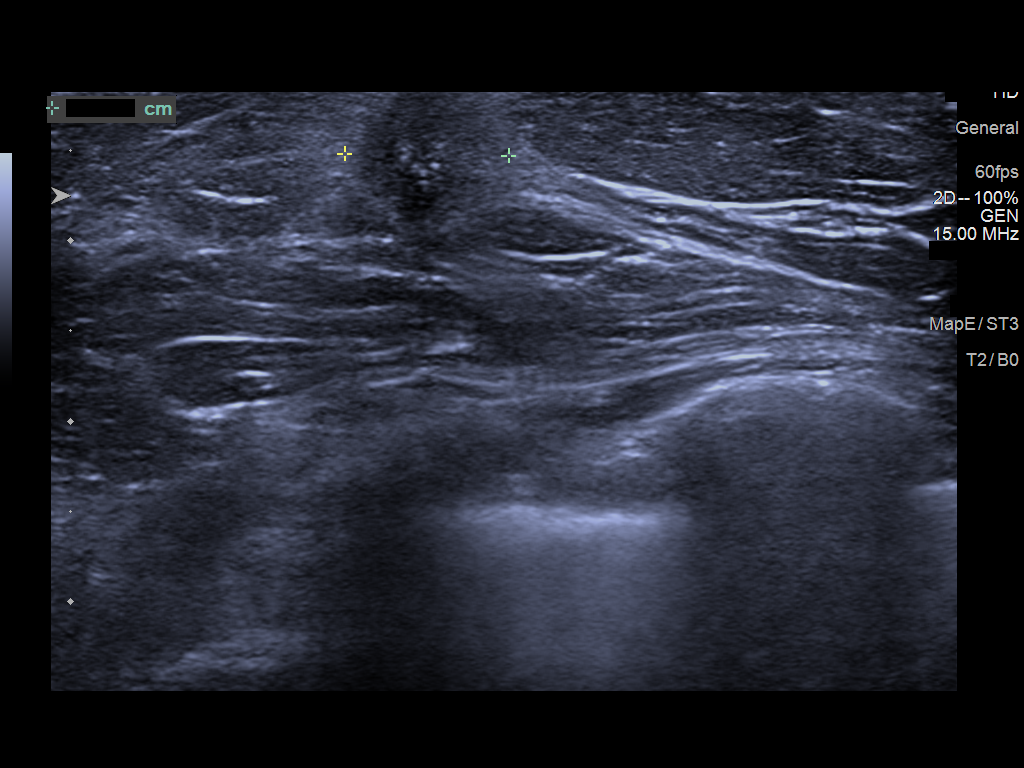
[im 5/6]
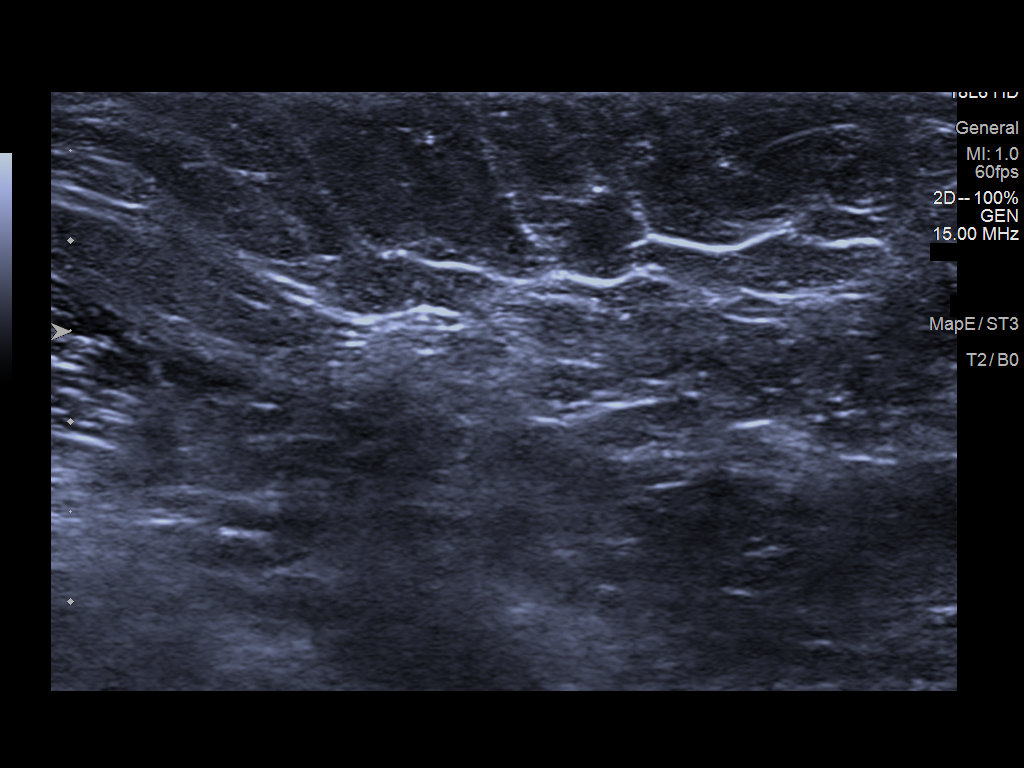
[im 6/6]
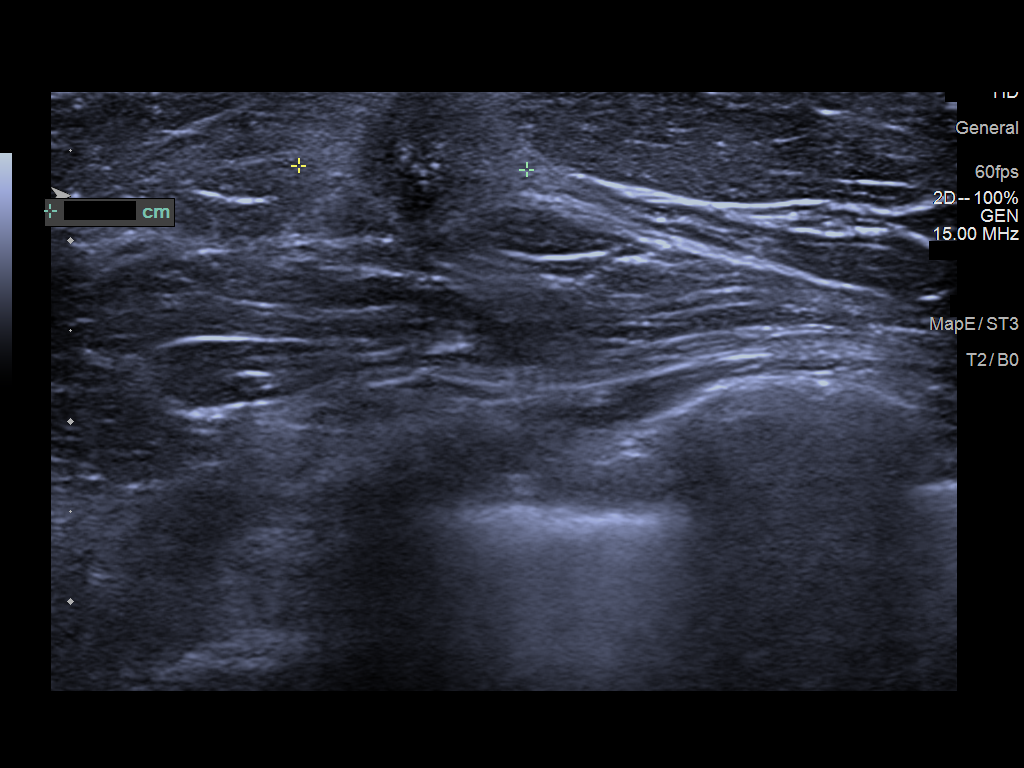

[6 of 6 positions shown; findings below may reference images not displayed]

ACR Breast Density Category b: There are scattered areas of
fibroglandular density.
FINDINGS: Cc and MLO views of bilateral breasts, spot tangential view of right
breast are submitted. There is a mass in palpable area upper-outer
quadrant right breast. Postsurgical changes are identified in the
right breast. The left breast is negative.

Targeted ultrasound is performed, showing 0.6 x 0.8 x 1.4 cm slight
irregular hypoechoic lesion with halo surrounding hyperechogenicity
at the palpable area right breast 10 o'clock 4 cm from nipple.
Ultrasound of the right axilla is negative.
IMPRESSION: Suspicious findings.

RECOMMENDATION:
Ultrasound-guided core biopsy of right breast mass.

I have discussed the findings and recommendations with the patient.
If applicable, a reminder letter will be sent to the patient
regarding the next appointment.

BI-RADS CATEGORY  4: Suspicious.

## 2022-07-21 IMAGING — MG DIGITAL DIAGNOSTIC BILAT W/ TOMO W/ CAD
6 of 12 series · 6 of 36 positions shown · non-contrast
Comparison: Prior films

CLINICAL DATA: Personal history of right breast cancer status post
lumpectomy 3113. Patient complains of a palpable mass right breast

EXAM:
DIGITAL DIAGNOSTIC BILATERAL MAMMOGRAM WITH TOMOSYNTHESIS AND CAD;
ULTRASOUND RIGHT BREAST LIMITED
TECHNIQUE: Bilateral digital diagnostic mammography and breast tomosynthesis
was performed. The images were evaluated with computer-aided
detection.; Targeted ultrasound examination of the right breast was
performed

[R CC synth-2D (1 of 3)]
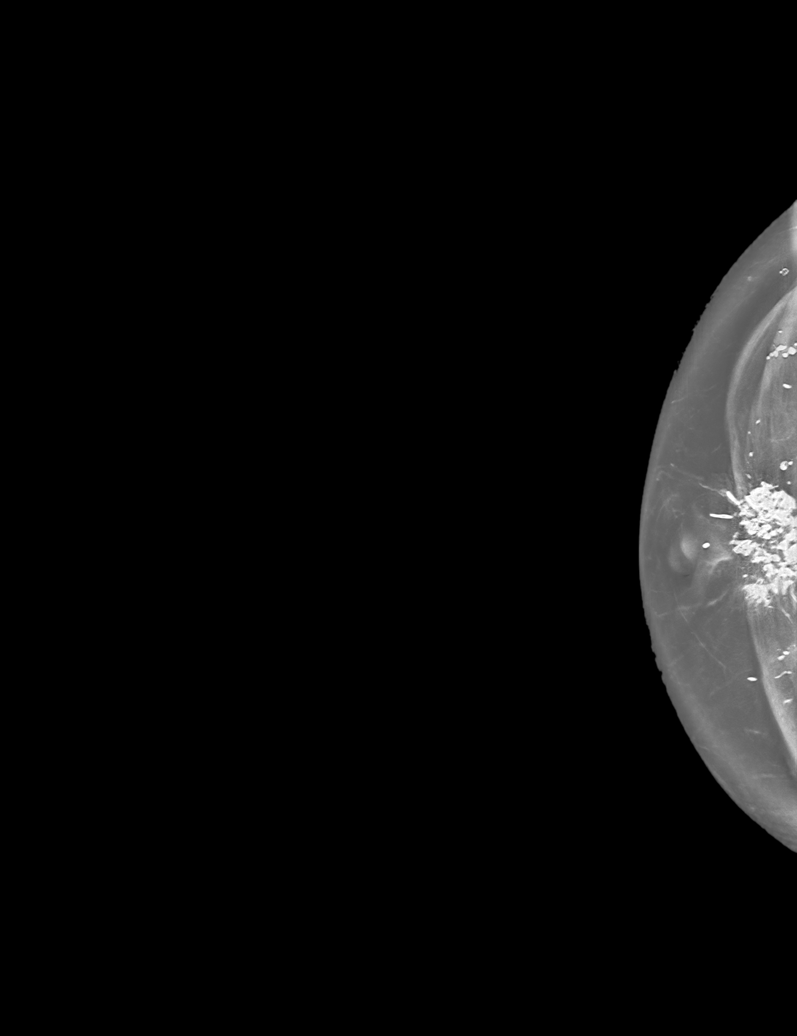

[R CC synth-2D (2 of 3)]
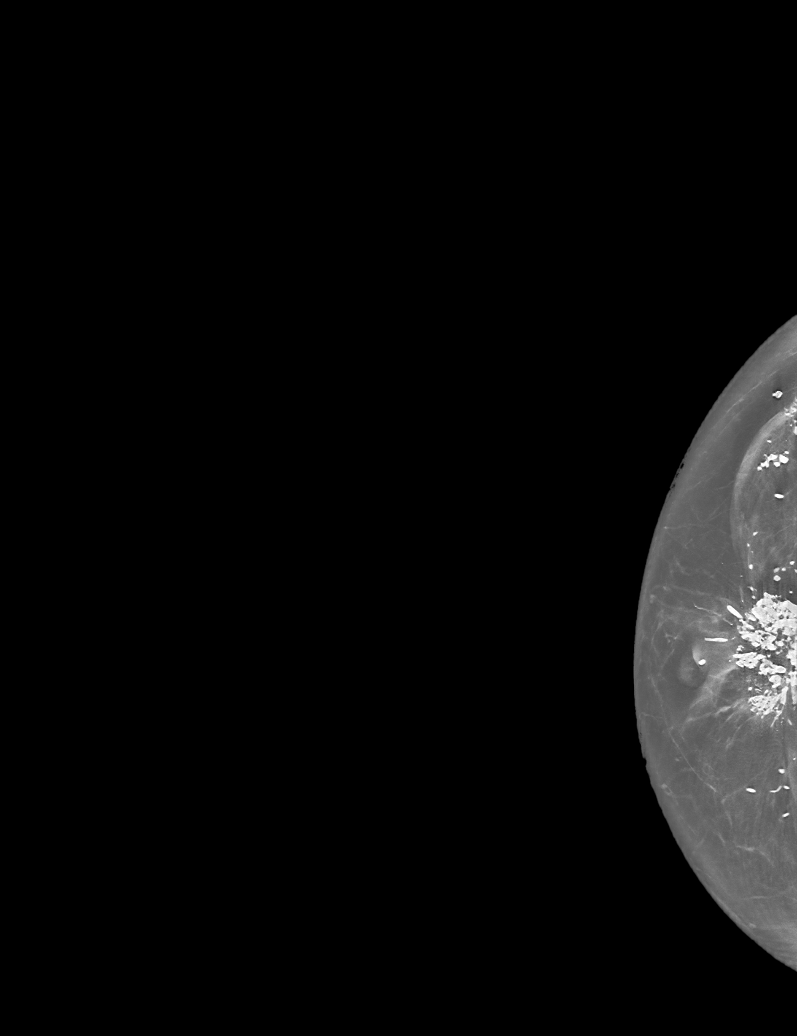

[R CC synth-2D (3 of 3)]
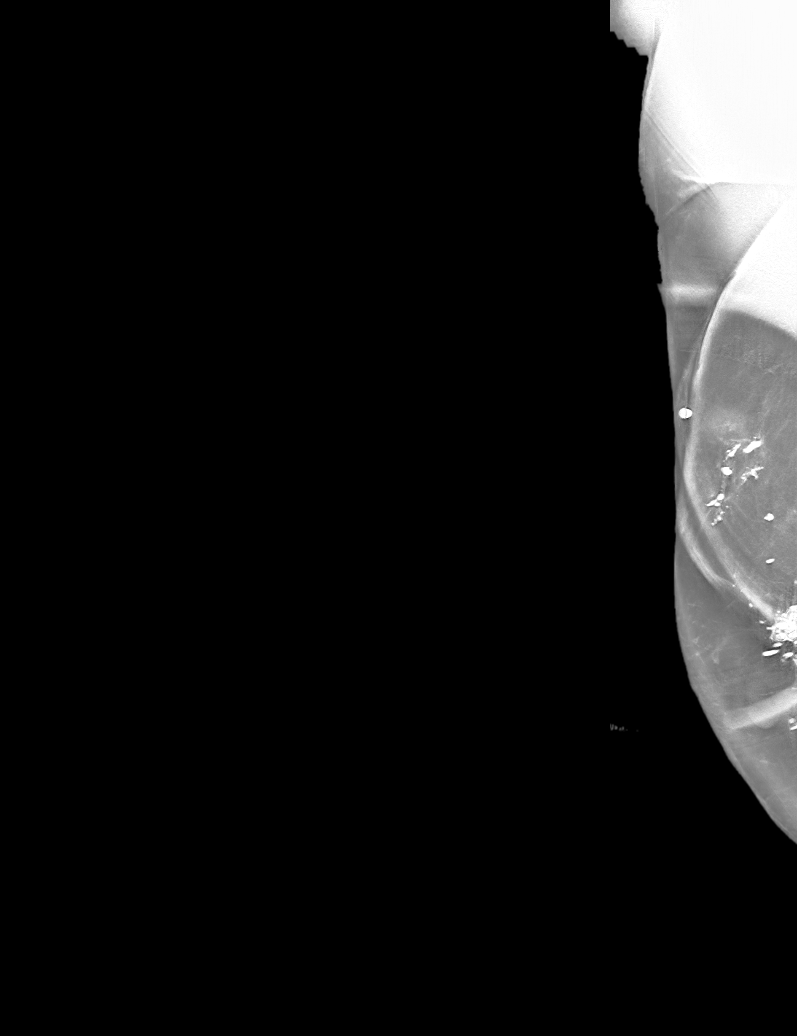

[L CC synth-2D]
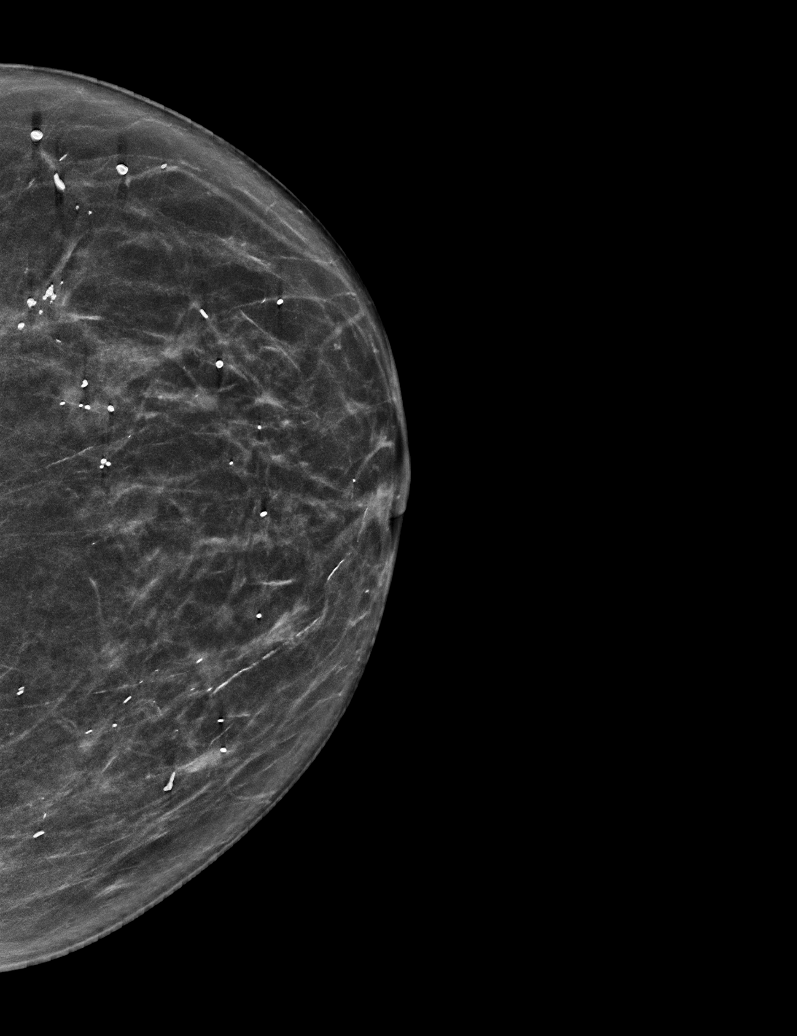

[L MLO synth-2D]
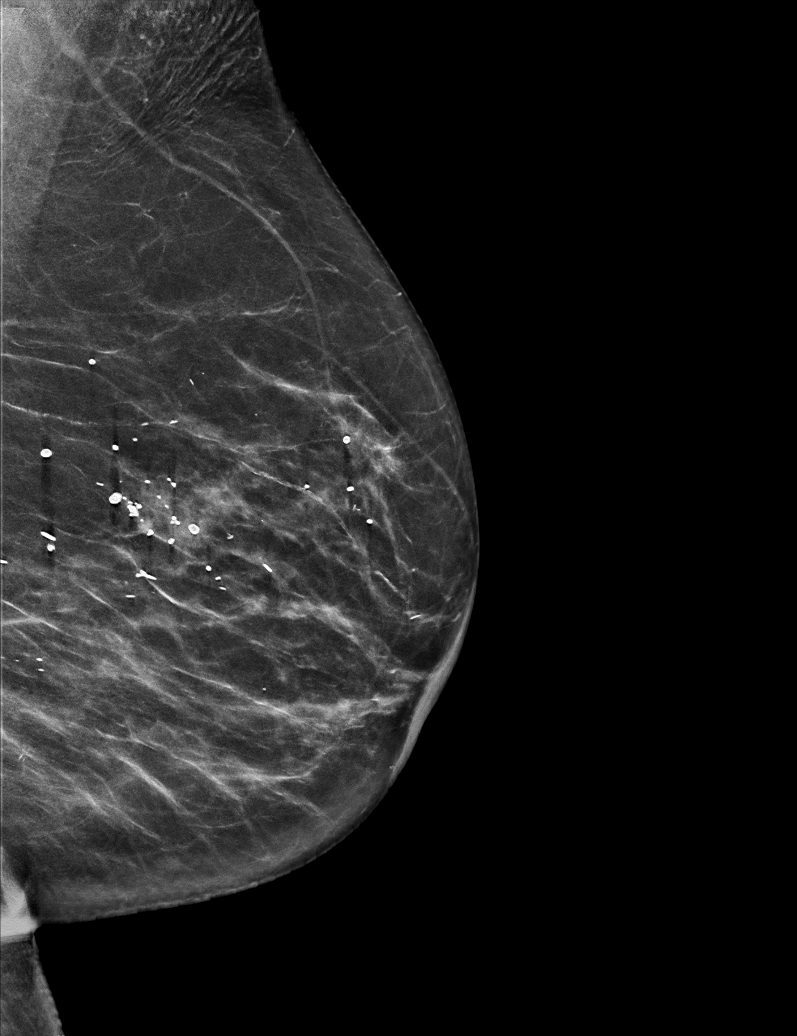

[R MLO synth-2D]
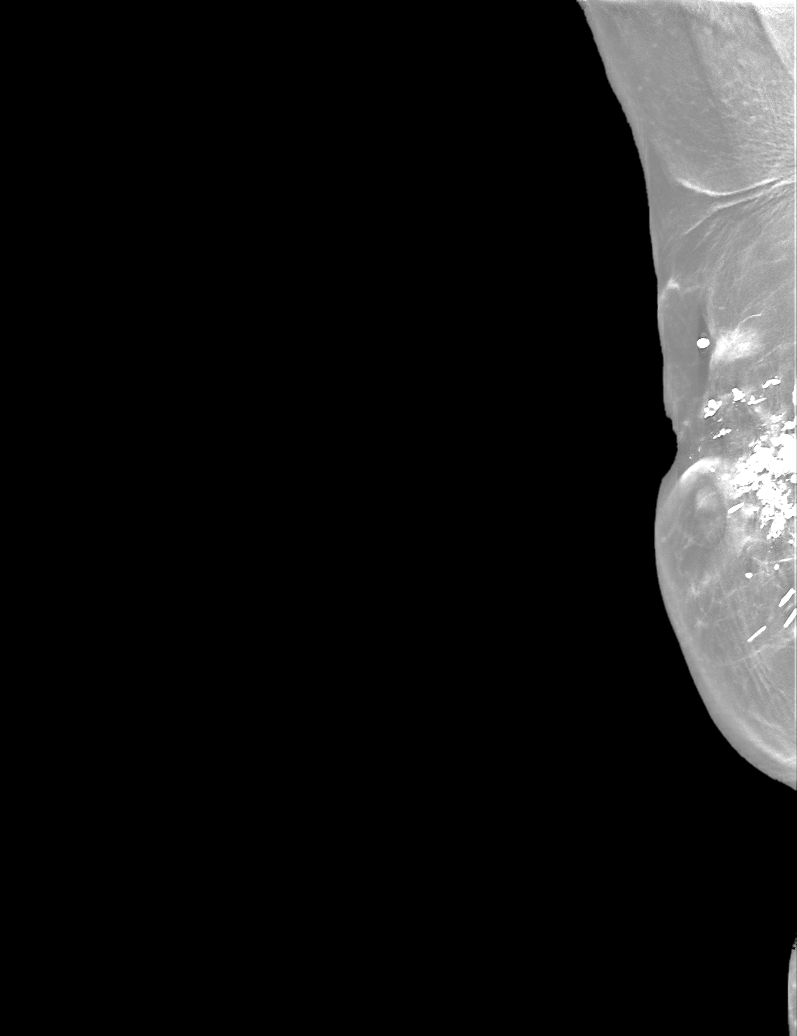

[6 of 36 positions shown; findings below may reference images not displayed]

ACR Breast Density Category b: There are scattered areas of
fibroglandular density.
FINDINGS: Cc and MLO views of bilateral breasts, spot tangential view of right
breast are submitted. There is a mass in palpable area upper-outer
quadrant right breast. Postsurgical changes are identified in the
right breast. The left breast is negative.

Targeted ultrasound is performed, showing 0.6 x 0.8 x 1.4 cm slight
irregular hypoechoic lesion with halo surrounding hyperechogenicity
at the palpable area right breast 10 o'clock 4 cm from nipple.
Ultrasound of the right axilla is negative.
IMPRESSION: Suspicious findings.

RECOMMENDATION:
Ultrasound-guided core biopsy of right breast mass.

I have discussed the findings and recommendations with the patient.
If applicable, a reminder letter will be sent to the patient
regarding the next appointment.

BI-RADS CATEGORY  4: Suspicious.

## 2022-07-24 IMAGING — MG MM BREAST LOCALIZATION CLIP
6 series · 6 of 18 positions shown · non-contrast
Comparison: Previous exam(s).

CLINICAL DATA: Post biopsy mammogram of the right breast for clip
placement.

EXAM:
DIAGNOSTIC RIGHT MAMMOGRAM POST ULTRASOUND BIOPSY

[R ML synth-2D]
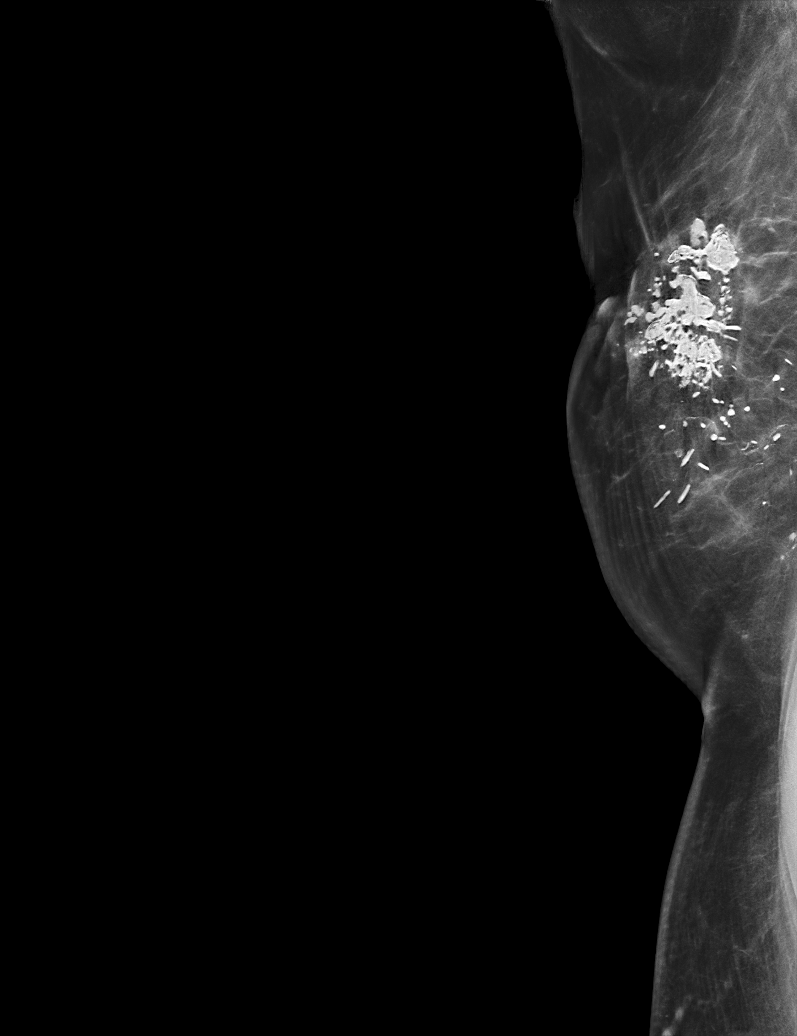

[R CC synth-2D]
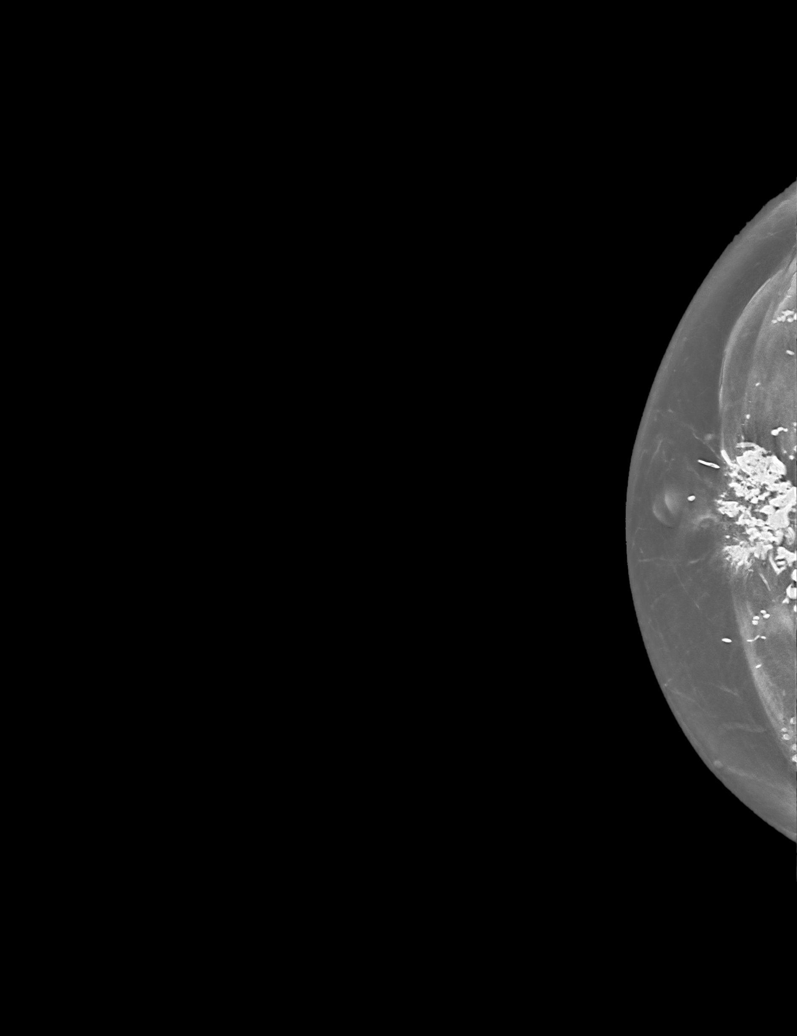

[R XCCL synth-2D]
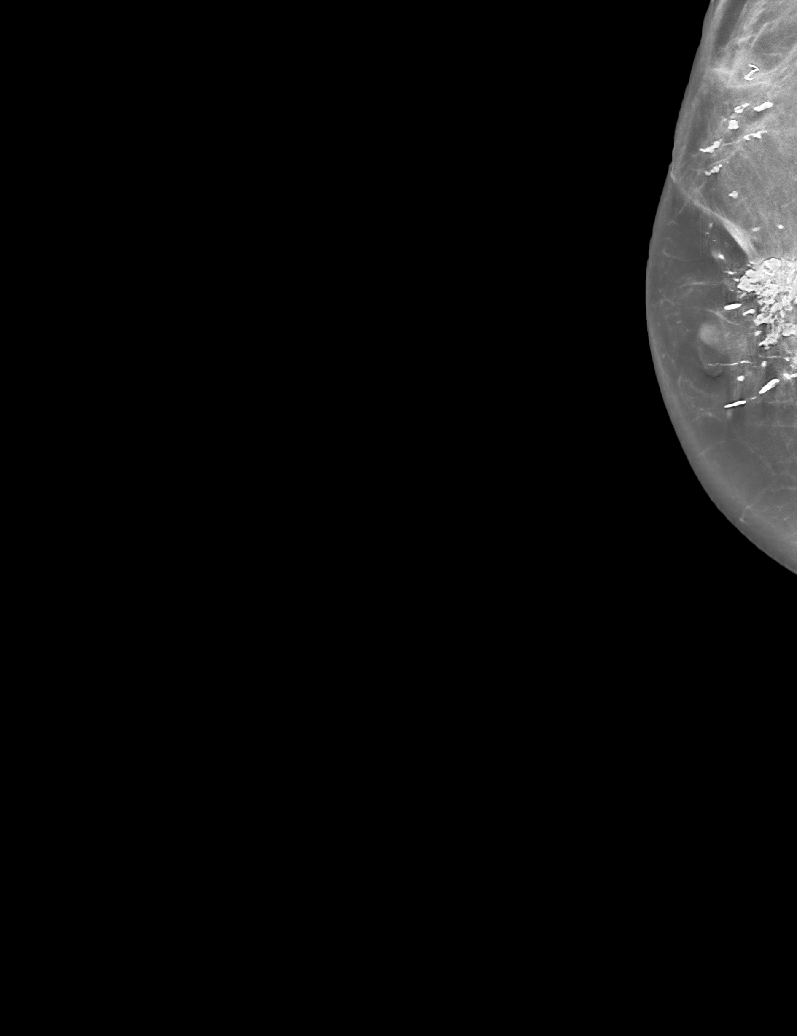

[R ML tomo · tomo slice 39/78.0]
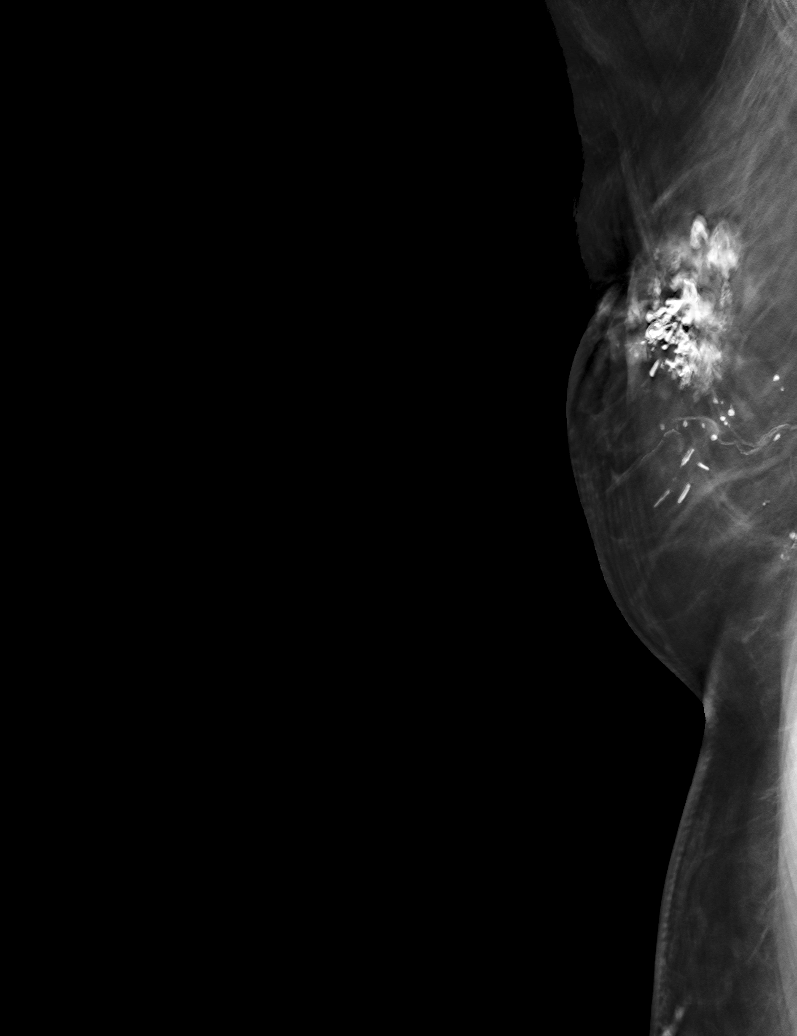

[R XCCL tomo · tomo slice 33/64.0]
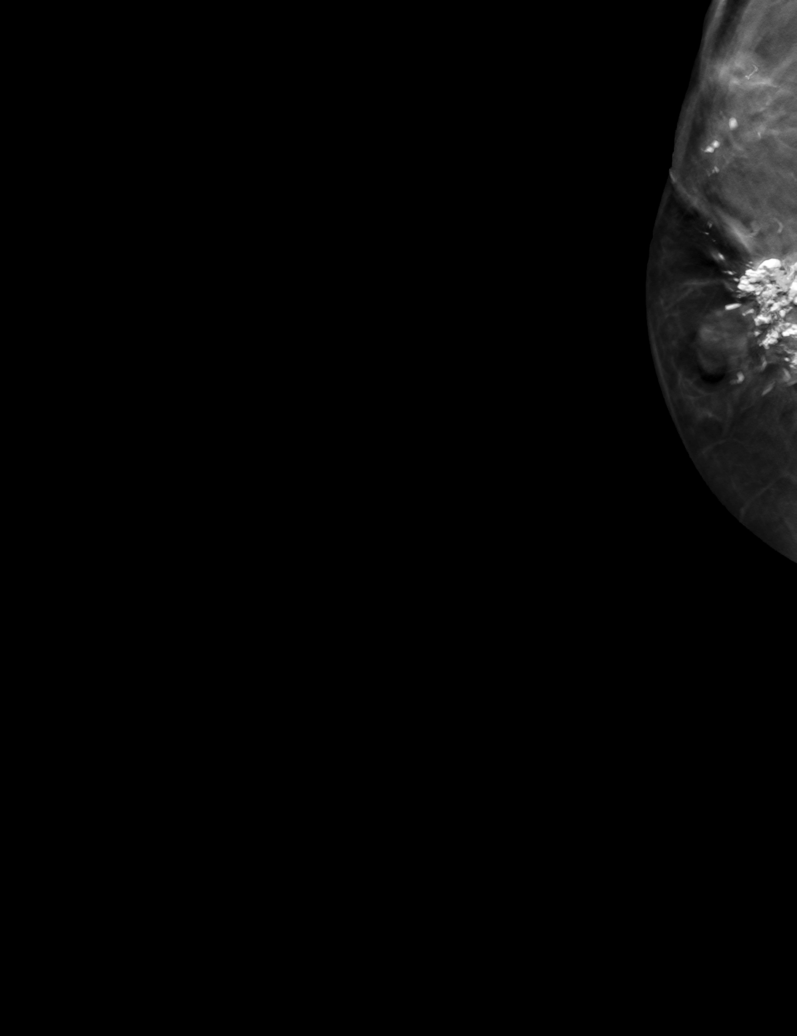

[R CC tomo · tomo slice 31/60.0]
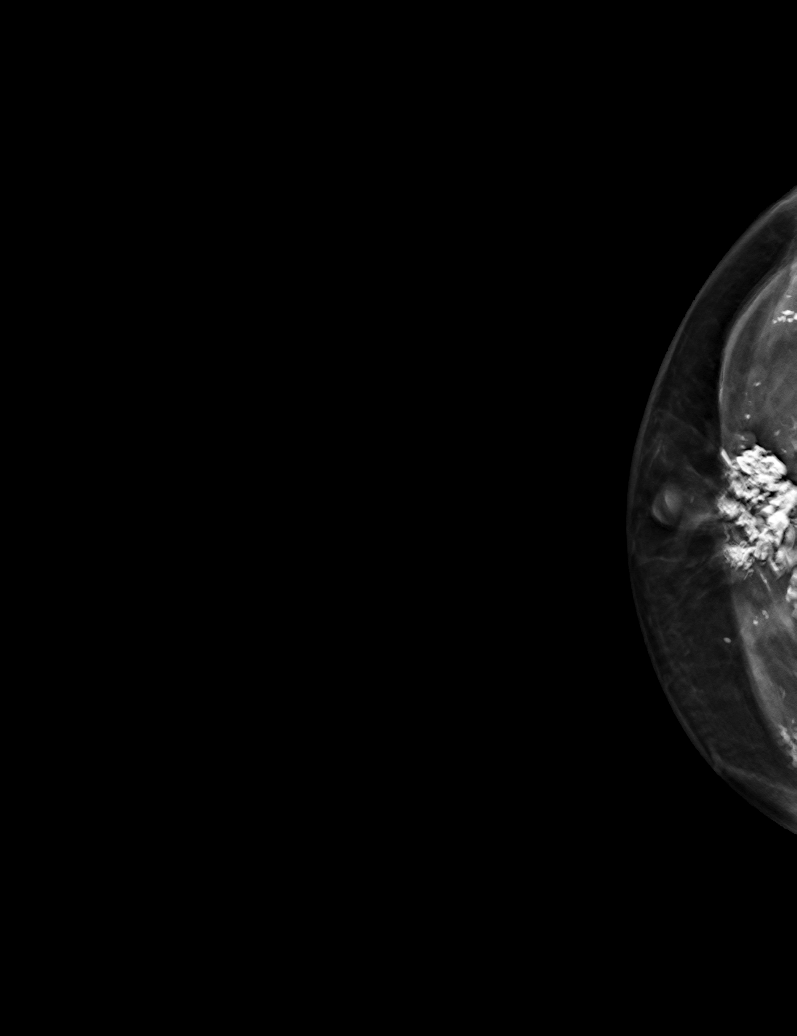

[6 of 18 positions shown; findings below may reference images not displayed]

FINDINGS: Mammographic images were obtained following ultrasound guided biopsy
of a mass in the upper-outer right breast. The biopsy marking clip
is in expected position at the site of biopsy.
IMPRESSION: Appropriate positioning of the Q shaped biopsy marking clip at the
site of biopsy in the upper-outer right breast.

Final Assessment: Post Procedure Mammograms for Marker Placement

## 2022-07-26 ENCOUNTER — Ambulatory Visit: Payer: Medicare Other | Attending: Infectious Diseases | Admitting: Infectious Diseases

## 2022-07-26 DIAGNOSIS — Z87440 Personal history of urinary (tract) infections: Secondary | ICD-10-CM | POA: Insufficient documentation

## 2022-07-26 DIAGNOSIS — N958 Other specified menopausal and perimenopausal disorders: Secondary | ICD-10-CM | POA: Diagnosis not present

## 2022-07-26 DIAGNOSIS — Z780 Asymptomatic menopausal state: Secondary | ICD-10-CM | POA: Diagnosis not present

## 2022-07-26 NOTE — Progress Notes (Signed)
The purpose of this virtual visit is to provide medical care while limiting exposure to the novel coronavirus (COVID19) for both patient and office staff.   Consent was obtained for phone visit:  Yes.   Answered questions that patient had about telehealth interaction:  Yes.   I discussed the limitations, risks, security and privacy concerns of performing an evaluation and management service by telephone. I also discussed with the patient that there may be a patient responsible charge related to this service. The patient expressed understanding and agreed to proceed.   Patient Location: Home Provider Location:office People on the call husband and patient Patient could not talk due to tracheostomy issues Husband was the one talking and answering questions This is a follow up visit 2 weeks ago I had seen patient in person on 5/16 for recurrent UTI. 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 and possible candida vaginitis, and poor genital hygiene due to bed bound status Discussed in detail with patient and husband and to follow the  protocol noted below  for preventing UTI   *Estrace /Premarin cream topically- peasized apply topically three times a week ( she has been using it for a long time- ideally need to be cleared by her oncologist as she has had breast ca) * Cetaphil to clean the genital area ( not soap)  * Cranberry supplement (-Knudsen cranberry concentrate- 1 ounce mixed with 8 ounces of water *wash with water after bowel movt *Probiotic for vaginal health( can try Pearls vaginal health) * Increase water consumption- 8 glasses a day *Ask your doctors not to check your urine on a routine basis  *Avoid  antibiotics unless systemic infection/ or before cystoscopy * Kegel Exercise to strengthen pelvic floor  This is follow up- pt's husband has been doing the above protocol diligently and patient apparently is feeling better Symptoms have improved He stopped the macrobid a week ago as recommended by me to avoid long term nitrofurantion induced lung injury  Pts main problem now is mucus plugs and tracheostomy plugs- he wil seeing ENT soon  Continue the above plan Follow PRN Total time spent on this call 14 min

## 2022-07-28 ENCOUNTER — Emergency Department
Admission: EM | Admit: 2022-07-28 | Discharge: 2022-07-28 | Disposition: A | Discharge status: Home / Self Care | Payer: Medicare Other | Attending: Emergency Medicine | Admitting: Emergency Medicine

## 2022-07-28 ENCOUNTER — Other Ambulatory Visit: Payer: Self-pay

## 2022-07-28 DIAGNOSIS — Z4659 Encounter for fitting and adjustment of other gastrointestinal appliance and device: Secondary | ICD-10-CM | POA: Diagnosis present

## 2022-07-28 DIAGNOSIS — K9429 Other complications of gastrostomy: Secondary | ICD-10-CM | POA: Insufficient documentation

## 2022-07-28 DIAGNOSIS — Z931 Gastrostomy status: Secondary | ICD-10-CM

## 2022-07-28 NOTE — ED Triage Notes (Signed)
Pt presents via POV c/o feeding tube being dislodged. Reports feeding tube placed x 6 months ago.

## 2022-07-28 NOTE — ED Notes (Signed)
Patient declined discharge vital signs. Patient was assisted by two staff members to wheelchair by stand and pivot.

## 2022-07-28 NOTE — Discharge Instructions (Addendum)
Address: [address], [place], [place] [address] Phone: [phone]  Please call this number Monday morning around 8 or 9 AM and state that you were seen in the emergency room and was told by Dr Elby Showers IR doctor-to have a replacement of your GJ tube either Monday or Tuesday.  I have also given this Dr. Your information to help facilitate follow-up on Monday or Tuesday.  Return to the ER if you develop worsening symptoms or any other concerns

## 2022-07-28 NOTE — ED Provider Notes (Signed)
[facility] Provider Note    Event Date/Time   First MD Initiated Contact with Patient 07/28/22 1147     (approximate)   History   GI Problem (Feeding tube placement)   HPI  Angela David is a 82 y.o. 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  Patient initially had a colonic resection, ventral hernia repair with robotic G-tube placement by Dr. Margaretmary Bayley unfortunately she had to have this replaced by IR and upsized to a 24 [place] due to significant leakage around the tube that is now A Gj tube.  She states that she has not really been able to keep her medications or her food down secondary to it leaking out. They mostly use the G tube site due to the slowness of J tube sight. They report some increasing drainage around the outside but they are still able to push the medications through easily.  They report that the drainage is worse when she moves around or if she coughs.  They report that this happened previously.  She states it was last changed 6 months ago.  She denies any abdominal pain just some irritation around the tube site.  Physical Exam   Triage Vital Signs: ED Triage Vitals [07/28/22 1140]  Enc Vitals Group     BP (!) 97/58     Pulse Rate 88     Resp 20     Temp 99 F (37.2 C)     Temp Source Oral     SpO2 92 %     Weight      Height      Head Circumference      Peak Flow      Pain Score 8     Pain Loc      Pain Edu?      Excl. in GC?     Most recent vital signs: Vitals:   07/28/22 1140  BP: (!) 97/58  Pulse: 88  Resp: 20  Temp: 99 F (37.2 C)  SpO2: 92%     General: Awake, no distress.  CV:  Good peripheral perfusion.  Resp:  Normal effort.  Noted Abd:  No distention.  Patient has some food particles noted underneath the GJ-tube but no obvious signs of cellulitis.  A little bit of irritation noted Other:     ED Results / Procedures / Treatments   Labs (all labs  ordered are listed, but only abnormal results are displayed) Labs Reviewed - No data to display    IMPRESSION / MDM / ASSESSMENT AND PLAN / ED COURSE  I reviewed the triage vital signs and the nursing notes.   Patient's presentation is most consistent with acute, uncomplicated illness.   Patient comes in with concerns for leaking around her GJ tube.  Initial blood pressure slightly low but upon recheck was normotensive.  Discussed with patient I reviewed her blood work from 2 weeks ago where she had reassuring CMP and CBC.  We discussed if she needed to have repeat blood work today but she denies any weakness or any other concerns.  We also discussed her slightly low oxygen of 91 to 92% but she reports that this is baseline.  She denies any worsening shortness of breath.  Her abdomen is soft and nontender no signs of peritonitis.  She is a little bit of irritation around the G-tube due to the food particles leaking out hide we discussed cleaning this with  Q-tips and putting some barrier cream underneath it.  I discussed the case with interventional radiologist Dr. Berlin Hun will help patient get scheduled for an exchange on Monday.  Have also given the patient and her husband the number for them to call on Monday.  We discussed admission for IV fluids, IV medications but she is still able to get her medications and at this point and they we will trial outpatient but if she starts feeling more weak or unable to keep her medications in they can return to the ER for repeat evaluation otherwise they will hopefully have this replaced on Monday or Tuesday.      FINAL CLINICAL IMPRESSION(S) / ED DIAGNOSES   Final diagnoses:  Gastrojejunal (GJ) tube in place [facility])     Rx / DC Orders   ED Discharge Orders     None        Note:  This document was prepared using Dragon voice recognition software and may include unintentional dictation errors.   Concha Se, MD 07/28/22 1229

## 2022-07-30 ENCOUNTER — Other Ambulatory Visit: Payer: Self-pay | Admitting: Interventional Radiology

## 2022-07-30 ENCOUNTER — Ambulatory Visit
Admission: RE | Admit: 2022-07-30 | Discharge: 2022-07-30 | Disposition: A | Discharge status: Home / Self Care | Payer: Medicare Other | Source: Ambulatory Visit | Attending: Interventional Radiology | Admitting: Interventional Radiology

## 2022-07-30 DIAGNOSIS — K9413 Enterostomy malfunction: Secondary | ICD-10-CM | POA: Diagnosis present

## 2022-07-30 DIAGNOSIS — T85528A Displacement of other gastrointestinal prosthetic devices, implants and grafts, initial encounter: Secondary | ICD-10-CM

## 2022-07-30 HISTORY — PX: IR GJ TUBE CHANGE: IMG1440

## 2022-07-30 MED ORDER — IOHEXOL 300 MG/ML  SOLN
8.0000 mL | Freq: Once | INTRAMUSCULAR | Status: AC | PRN
  Administered 2022-07-30: 8 mL

## 2022-07-30 MED ORDER — LIDOCAINE VISCOUS HCL 2 % MT SOLN
OROMUCOSAL | Status: AC
  Filled 2022-07-30: qty 15

## 2022-07-30 MED ORDER — STERILE WATER FOR INJECTION IJ SOLN
INTRAMUSCULAR | Status: AC
  Filled 2022-07-30: qty 20

## 2022-10-02 ENCOUNTER — Other Ambulatory Visit: Payer: Self-pay | Admitting: Neurology

## 2022-10-02 ENCOUNTER — Encounter: Payer: Self-pay | Admitting: Neurology

## 2022-10-02 DIAGNOSIS — M6281 Muscle weakness (generalized): Secondary | ICD-10-CM

## 2022-10-02 DIAGNOSIS — E1142 Type 2 diabetes mellitus with diabetic polyneuropathy: Secondary | ICD-10-CM

## 2022-10-02 DIAGNOSIS — R29898 Other symptoms and signs involving the musculoskeletal system: Secondary | ICD-10-CM

## 2022-10-02 DIAGNOSIS — I6789 Other cerebrovascular disease: Secondary | ICD-10-CM

## 2022-10-02 DIAGNOSIS — R0600 Dyspnea, unspecified: Secondary | ICD-10-CM

## 2022-10-02 DIAGNOSIS — I679 Cerebrovascular disease, unspecified: Secondary | ICD-10-CM

## 2022-10-02 DIAGNOSIS — R269 Unspecified abnormalities of gait and mobility: Secondary | ICD-10-CM

## 2022-10-10 ENCOUNTER — Telehealth: Payer: Self-pay

## 2022-10-10 NOTE — Telephone Encounter (Signed)
We received a call from Sabrina at [facility] regarding Ms [facility] appointment tomorrow for an echo and carotid US. She does need a precert for these two tests, or notation that a precert is not needed. States this can be added to the appointment notes.  Please advise

## 2022-10-10 NOTE — Telephone Encounter (Signed)
I would think the facility that is doing the test ECHO and US carotid do precertification prior to the test, they should have all the patient's insurance information.

## 2022-10-11 ENCOUNTER — Ambulatory Visit: Payer: Medicare Other | Attending: Neurology

## 2022-10-11 ENCOUNTER — Ambulatory Visit (INDEPENDENT_AMBULATORY_CARE_PROVIDER_SITE_OTHER): Payer: Medicare Other

## 2022-10-11 DIAGNOSIS — M6281 Muscle weakness (generalized): Secondary | ICD-10-CM | POA: Insufficient documentation

## 2022-10-11 DIAGNOSIS — R0600 Dyspnea, unspecified: Secondary | ICD-10-CM | POA: Insufficient documentation

## 2022-10-11 DIAGNOSIS — R29898 Other symptoms and signs involving the musculoskeletal system: Secondary | ICD-10-CM | POA: Insufficient documentation

## 2022-10-11 DIAGNOSIS — I6789 Other cerebrovascular disease: Secondary | ICD-10-CM

## 2022-10-11 DIAGNOSIS — I679 Cerebrovascular disease, unspecified: Secondary | ICD-10-CM

## 2022-10-11 DIAGNOSIS — R269 Unspecified abnormalities of gait and mobility: Secondary | ICD-10-CM | POA: Diagnosis not present

## 2022-10-11 LAB — ECHOCARDIOGRAM COMPLETE
AR max vel: 1.04 cm2
AV Area VTI: 1.04 cm2
AV Area mean vel: 1 cm2
AV Mean grad: 11 mmHg
AV Peak grad: 20.3 mmHg
Ao pk vel: 2.25 m/s
Area-P 1/2: 3.88 cm2
Calc EF: 58.3 %
S' Lateral: 2.1 cm
Single Plane A2C EF: 54.7 %
Single Plane A4C EF: 60.3 %

## 2022-11-07 IMAGING — US US PLC BREAST LOC DEV 1ST LEASION INC US GUIDE*R*
1 series · 4 of 4 positions shown · non-contrast
Comparison: Previous exam(s)

CLINICAL DATA: Localization mass at the right breast 10 o'clock
position.

EXAM:
ULTRASOUND GUIDED RADIOFREQUENCY DEVICE LOCALIZATION OF THE RIGHT
BREAST

[Series 1: us plc breast loc dev 1st leasion inc us guide*rig · 0.06mm/px · 4 of 4 slices shown]
[im 1/4]
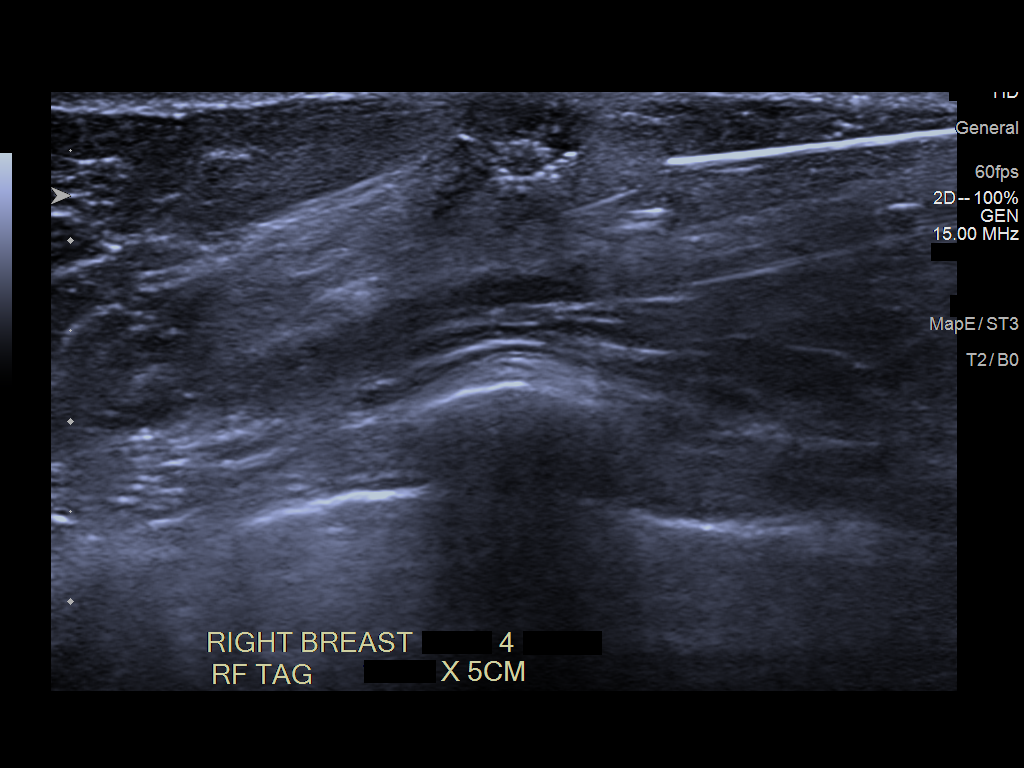
[im 2/4]
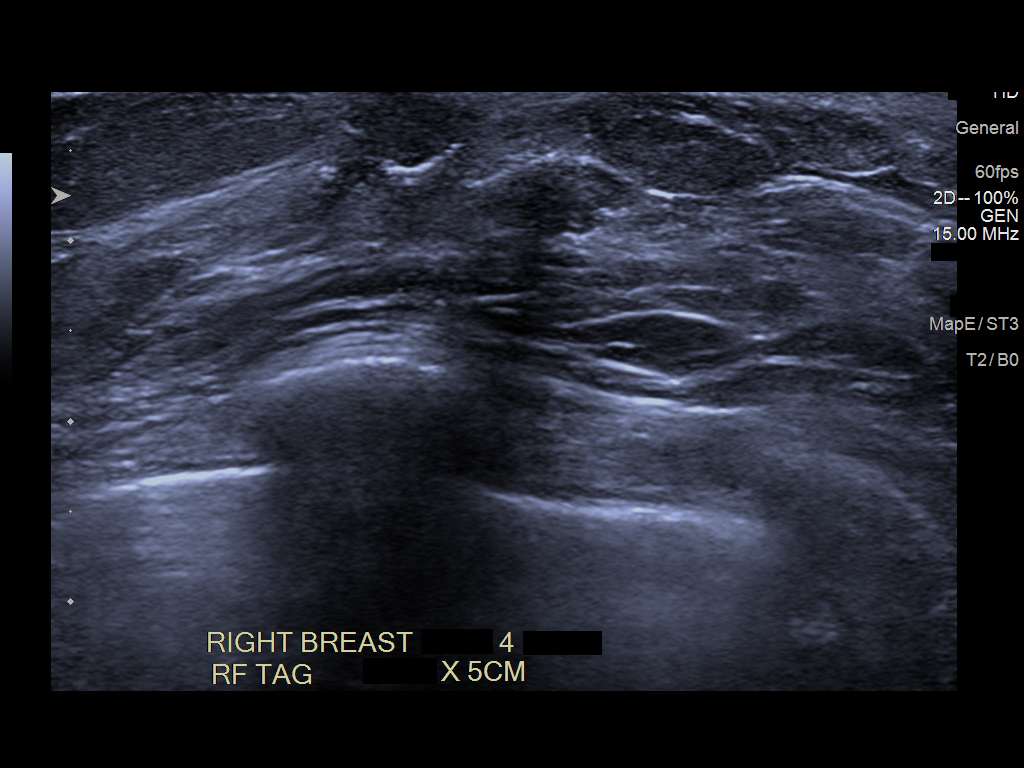
[im 3/4]
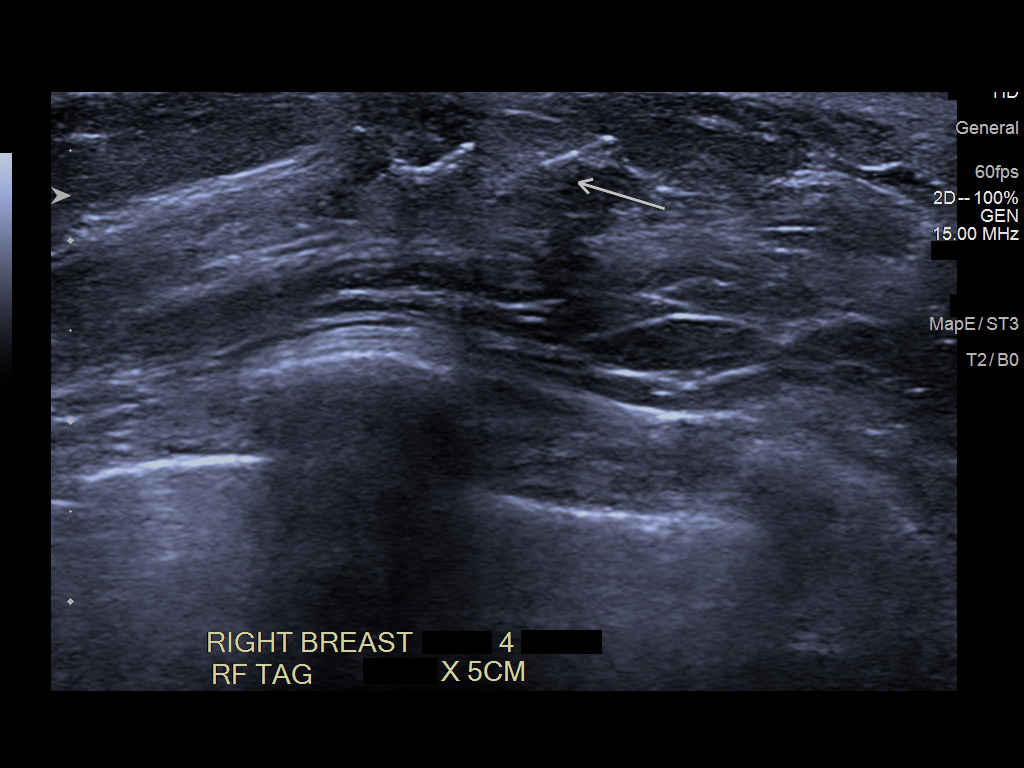
[im 4/4]
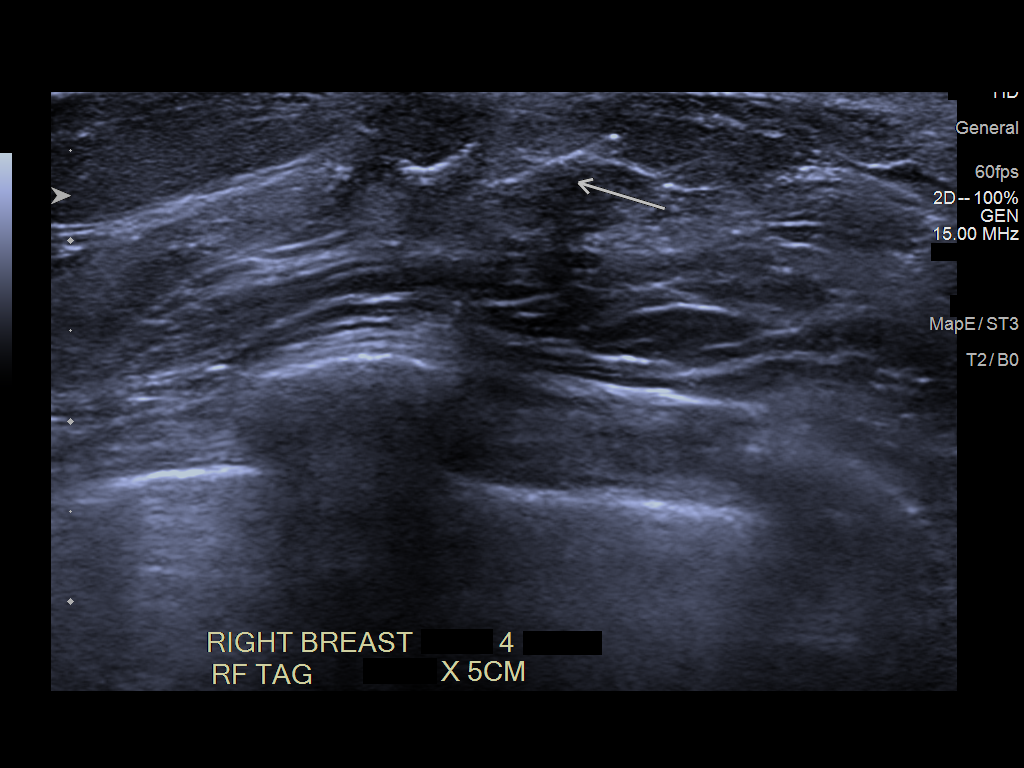

[4 of 4 positions shown; findings below may reference images not displayed]

FINDINGS: Patient presents for radiofrequency device localization prior to
surgical excision. I met with the patient and we discussed the
procedure of radiofrequency device localization including benefits
and alternatives. We discussed the high likelihood of a successful
procedure. We discussed the risks of the procedure including
infection, bleeding, tissue injury and further surgery. Informed,
written consent was given.

The usual time-out protocol was performed immediately prior to the
procedure.

Using ultrasound guidance, sterile technique, 1% lidocaine as local
anesthesia, a radiofrequency tag was used to localize the
superficial mass at the right breast 10 o'clock position 4 cm from
the nipple using a lateral approach.

The follow-up mammogram images confirm that the RF device is in the
expected location and are marked for Dr. Moatshe.

Follow-up survey of the patient confirms the presence of the RF
device.

The patient tolerated the procedure well and was released from the
[REDACTED].
IMPRESSION: Radiofrequency device localization of the mass at the right breast
10 o'clock position. No apparent complications.

## 2022-11-07 IMAGING — MG MM DIGITAL DIAGNOSTIC UNILAT*R* W/ TOMO W/ CAD
5 series · 6 of 17 positions shown · non-contrast
Comparison: Previous exam(s).

CLINICAL DATA: Postprocedure mammogram.

EXAM:
DIAGNOSTIC RIGHT MAMMOGRAM POST ULTRASOUND GUIDED RADIOFREQUENCY
DEVICE LOCALIZATION

[R CC synth-2D]
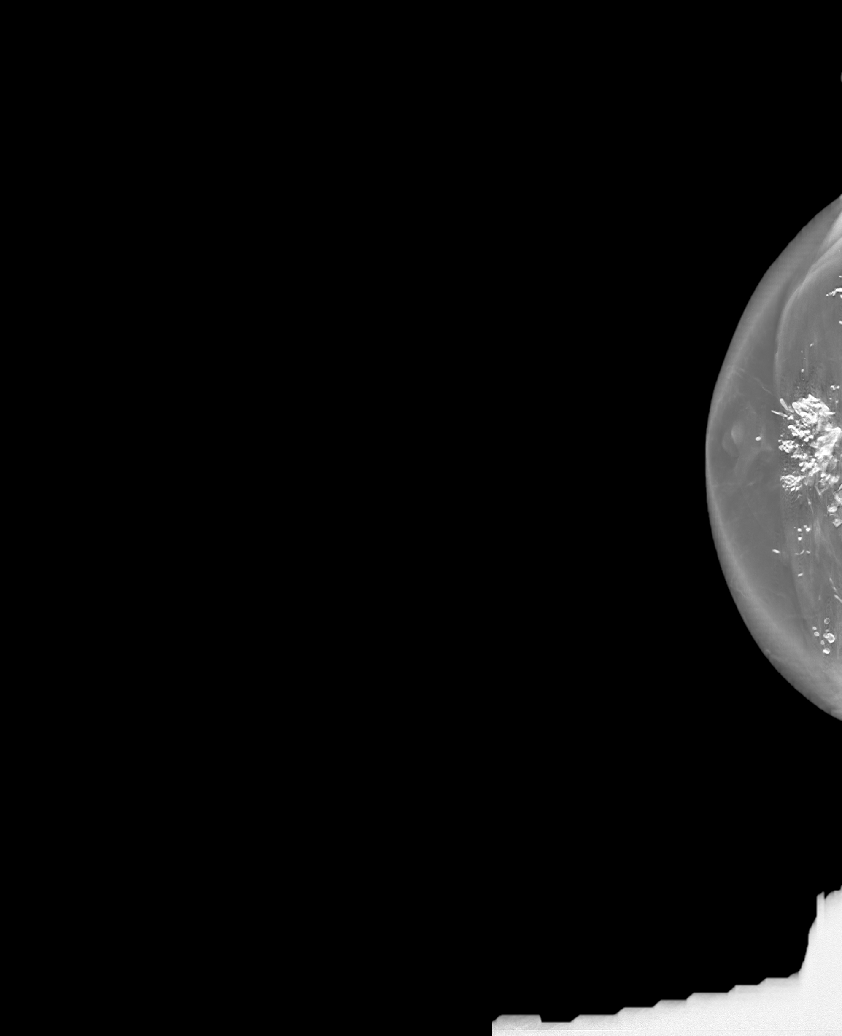

[R MLO synth-2D]
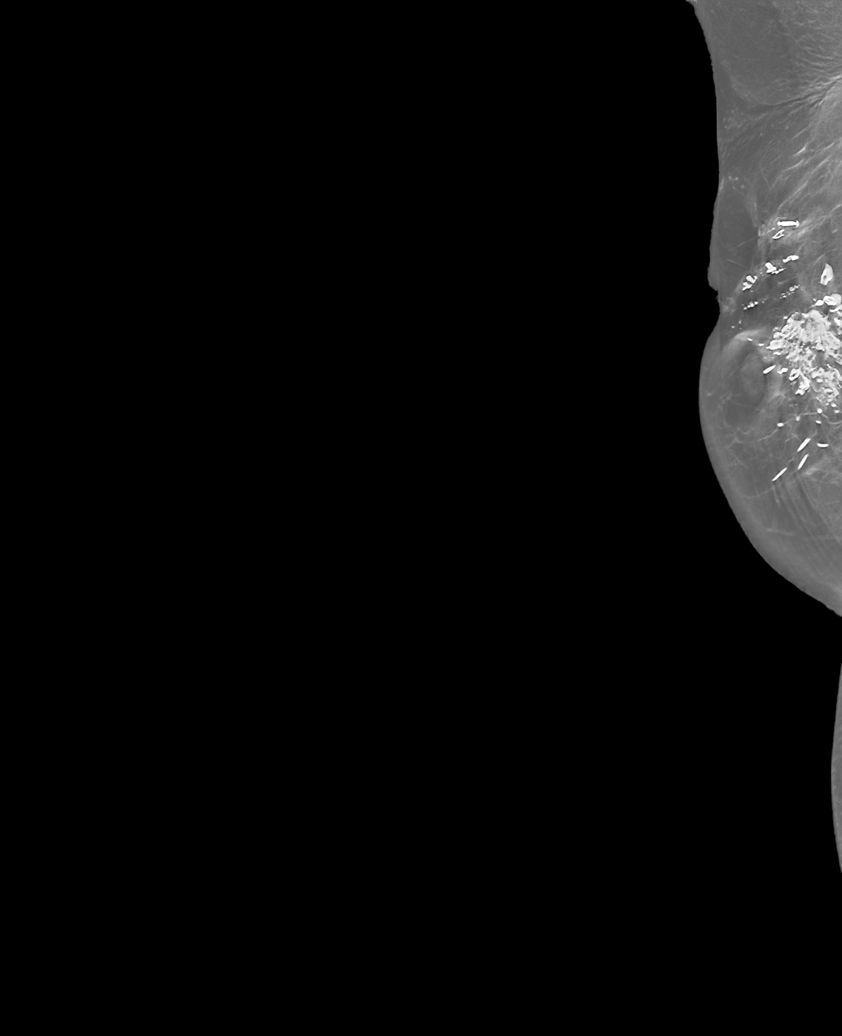

[R MLO tomo · 2 of 72 frames shown]
[frame 24/72]
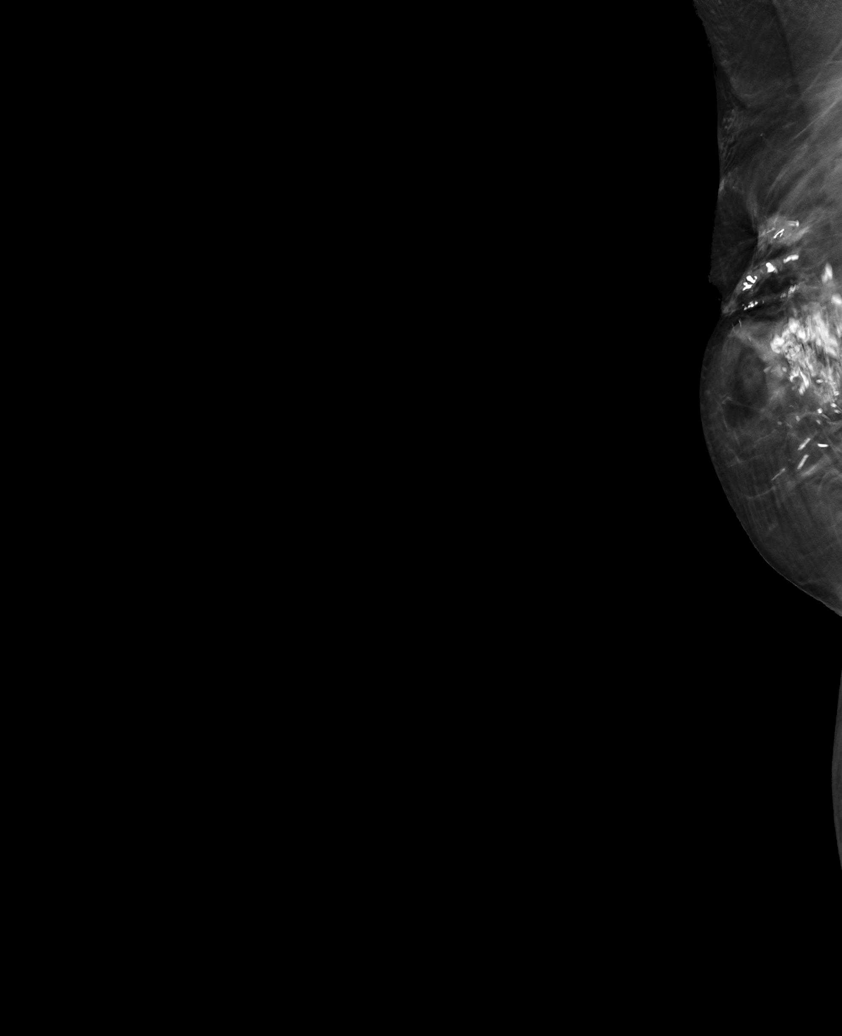
[frame 37/72]
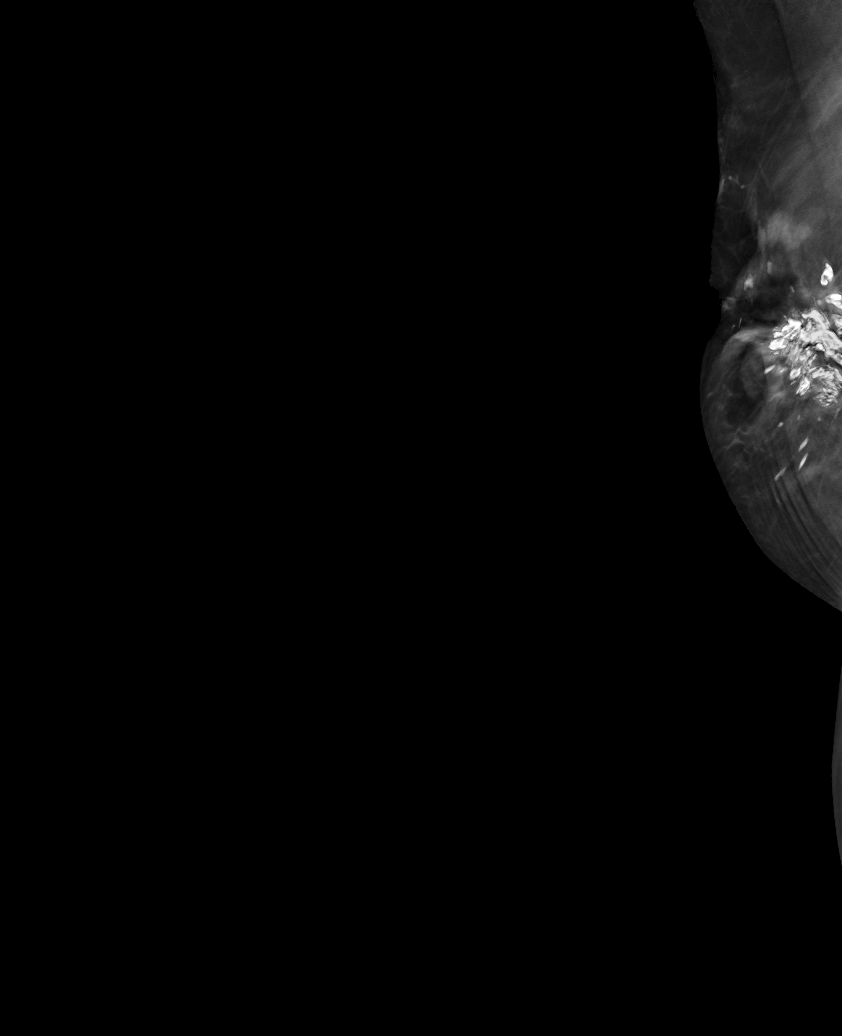

[R XCCL tomo · tomo slice 37/73.0]
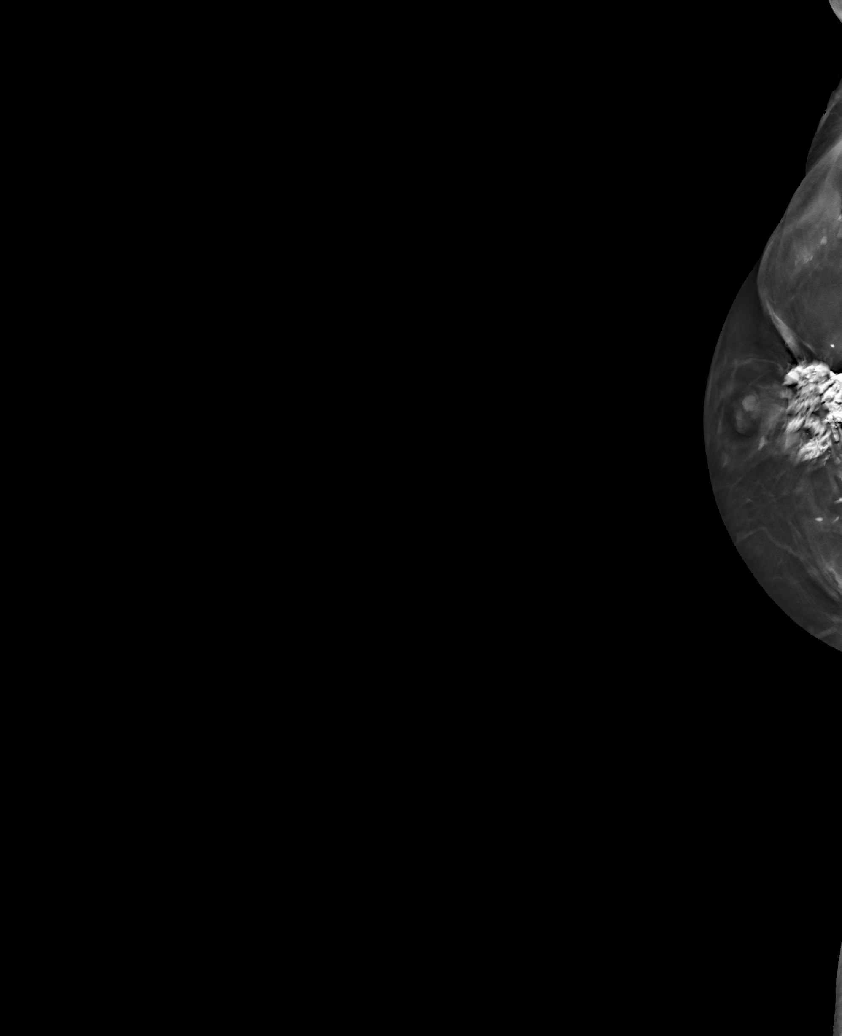

[R CC tomo · tomo slice 39/77.0]
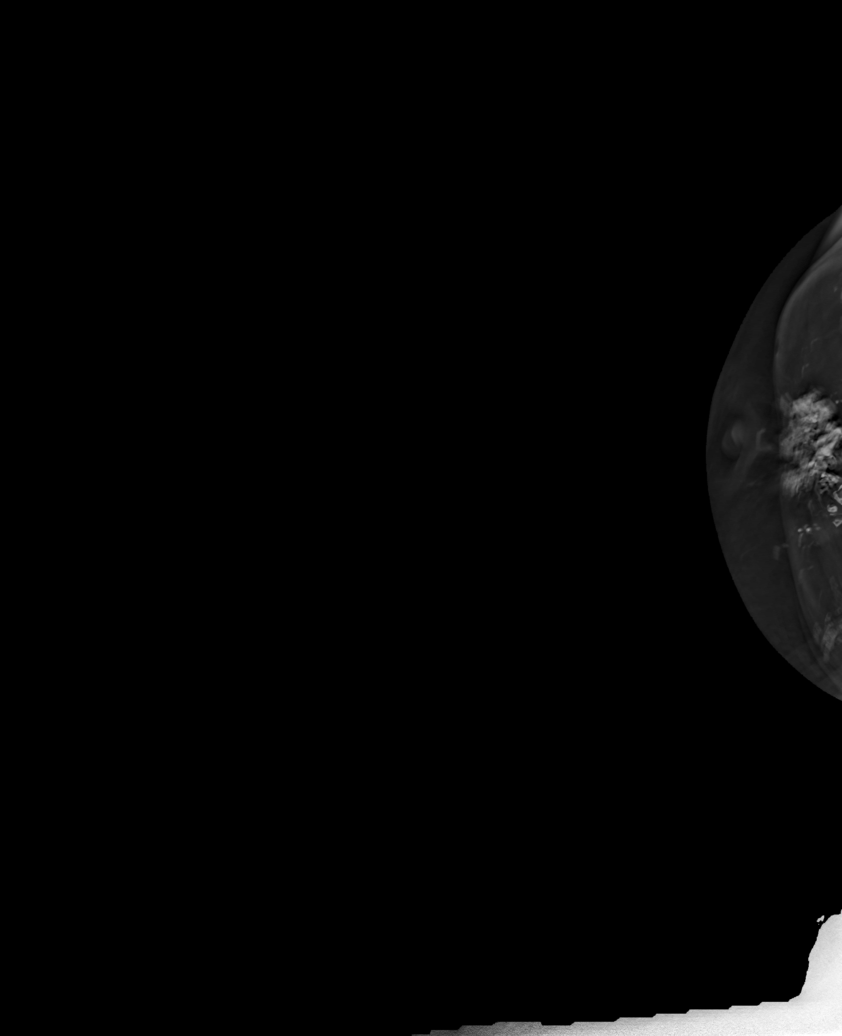

[6 of 17 positions shown; findings below may reference images not displayed]

ACR Breast Density Category b: There are scattered areas of
fibroglandular density.
FINDINGS: Mammographic images were obtained following ultrasound guided
radiofrequency device localization of the previously biopsied mass
at the right breast 10 o'clock position . The radiofrequency device
is located immediately adjacent to the Q shaped biopsy clip within
the superficial mass at the right breast 10 o'clock position. Of
note, due to difficulties with patient positioning, the clip and
radiofrequency device are only seen on the tomosynthesis MLO view.
IMPRESSION: Radiofrequency device is in appropriate position immediately
adjacent to the Q shaped biopsy clip within the superficial mass of
the right breast 9 o'clock position.

Final Assessment: Post Procedure Mammograms for Marker Placement

## 2022-11-11 IMAGING — MG MM BREAST SURGICAL SPECIMEN
2 series · 2 of 2 positions shown · non-contrast
Comparison: Previous exam(s).

CLINICAL DATA: Status [employer] localized right breast
lumpectomy.

EXAM:
SPECIMEN RADIOGRAPH OF THE right BREAST

[R (1 of 2)]
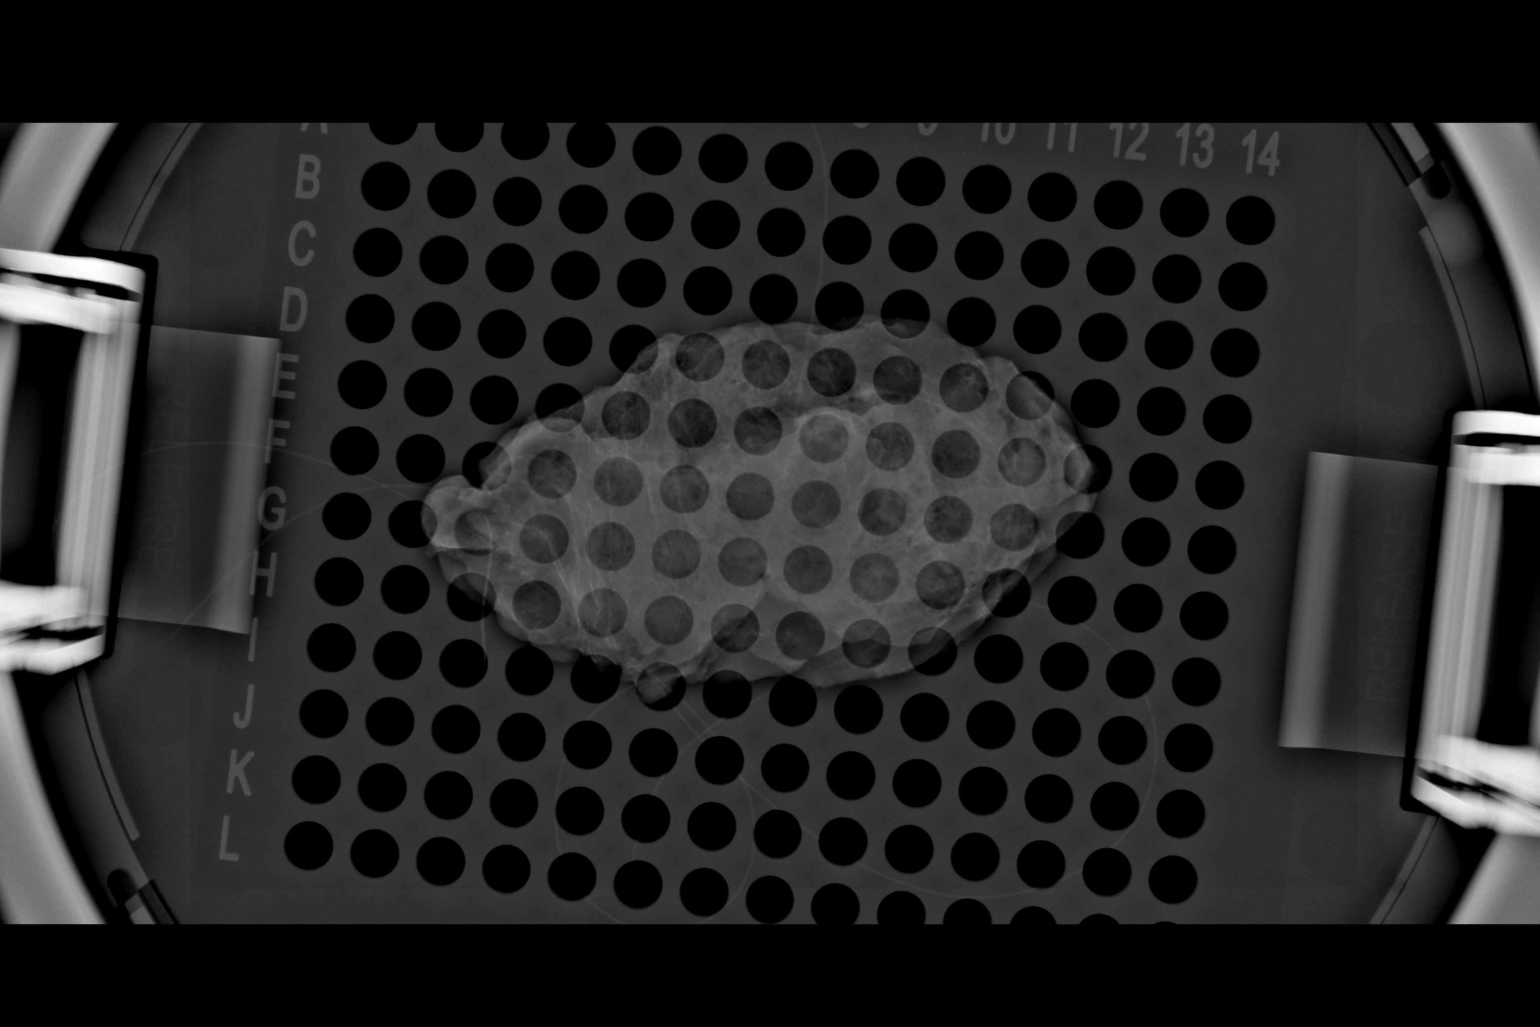

[R (2 of 2)]
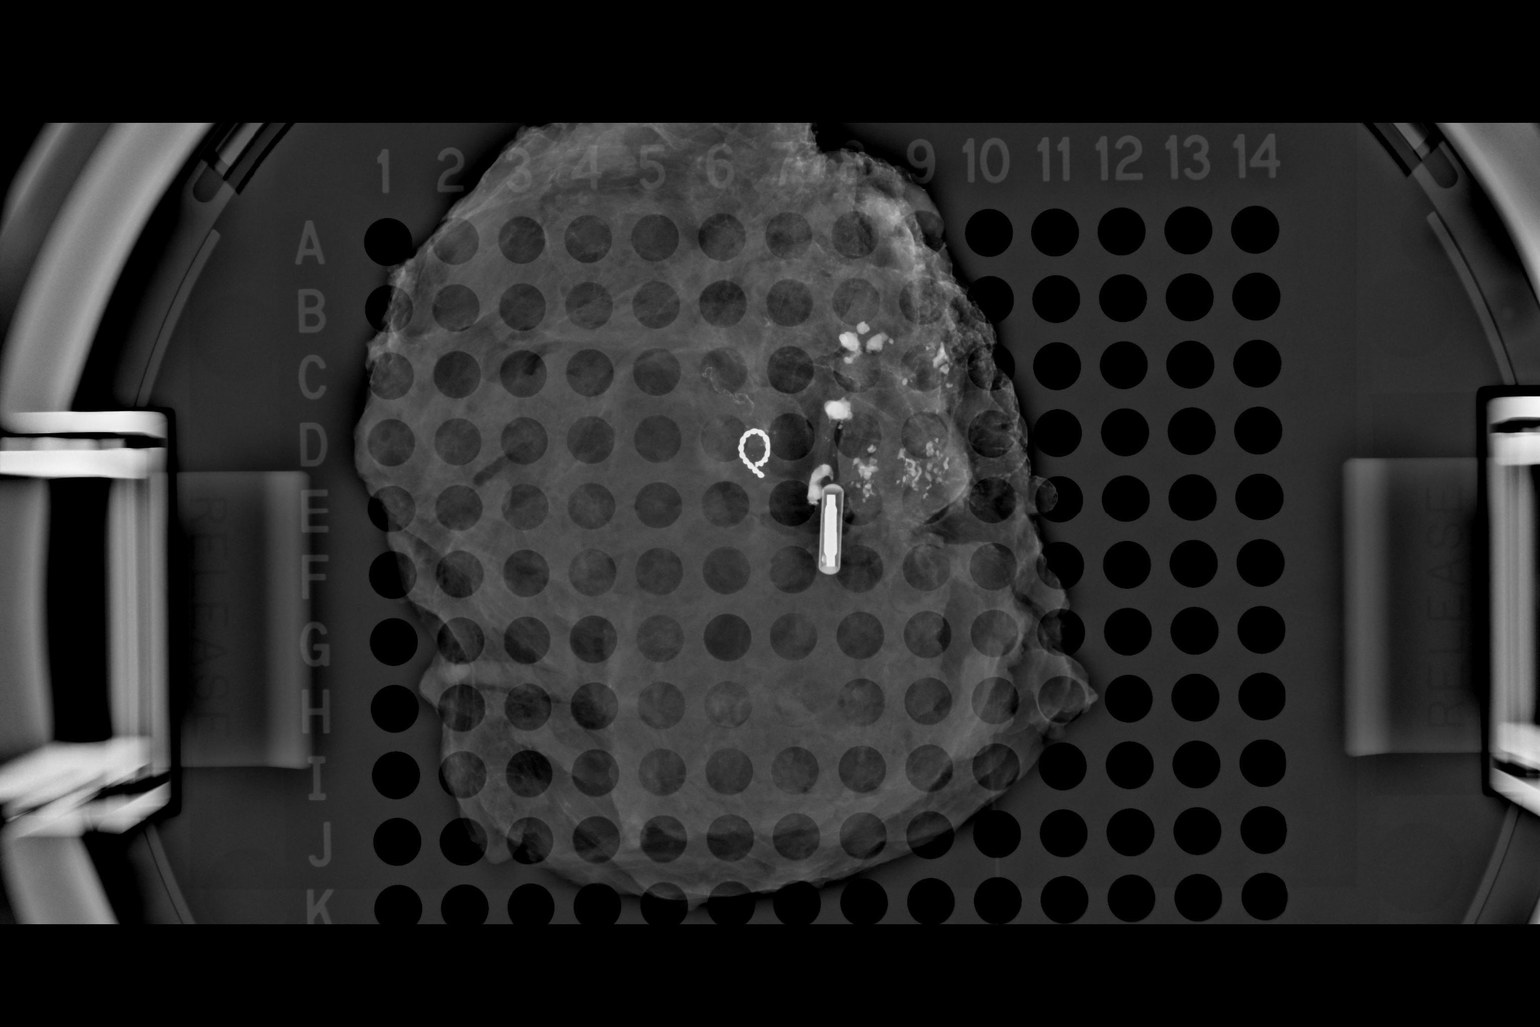

[2 of 2 positions shown; findings below may reference images not displayed]

FINDINGS: Status post excision of the right breast. The radiofrequency tag and
Q shaped clip are present within the specimen.
IMPRESSION: Specimen radiograph of the right breast.

## 2022-11-14 IMAGING — DX DG ABDOMEN 1V
1 series · 1 of 1 positions shown · non-contrast
Comparison: 10/01/2020

CLINICAL DATA: Peg tube placement

EXAM:
ABDOMEN - 1 VIEW

[abdomen supine]
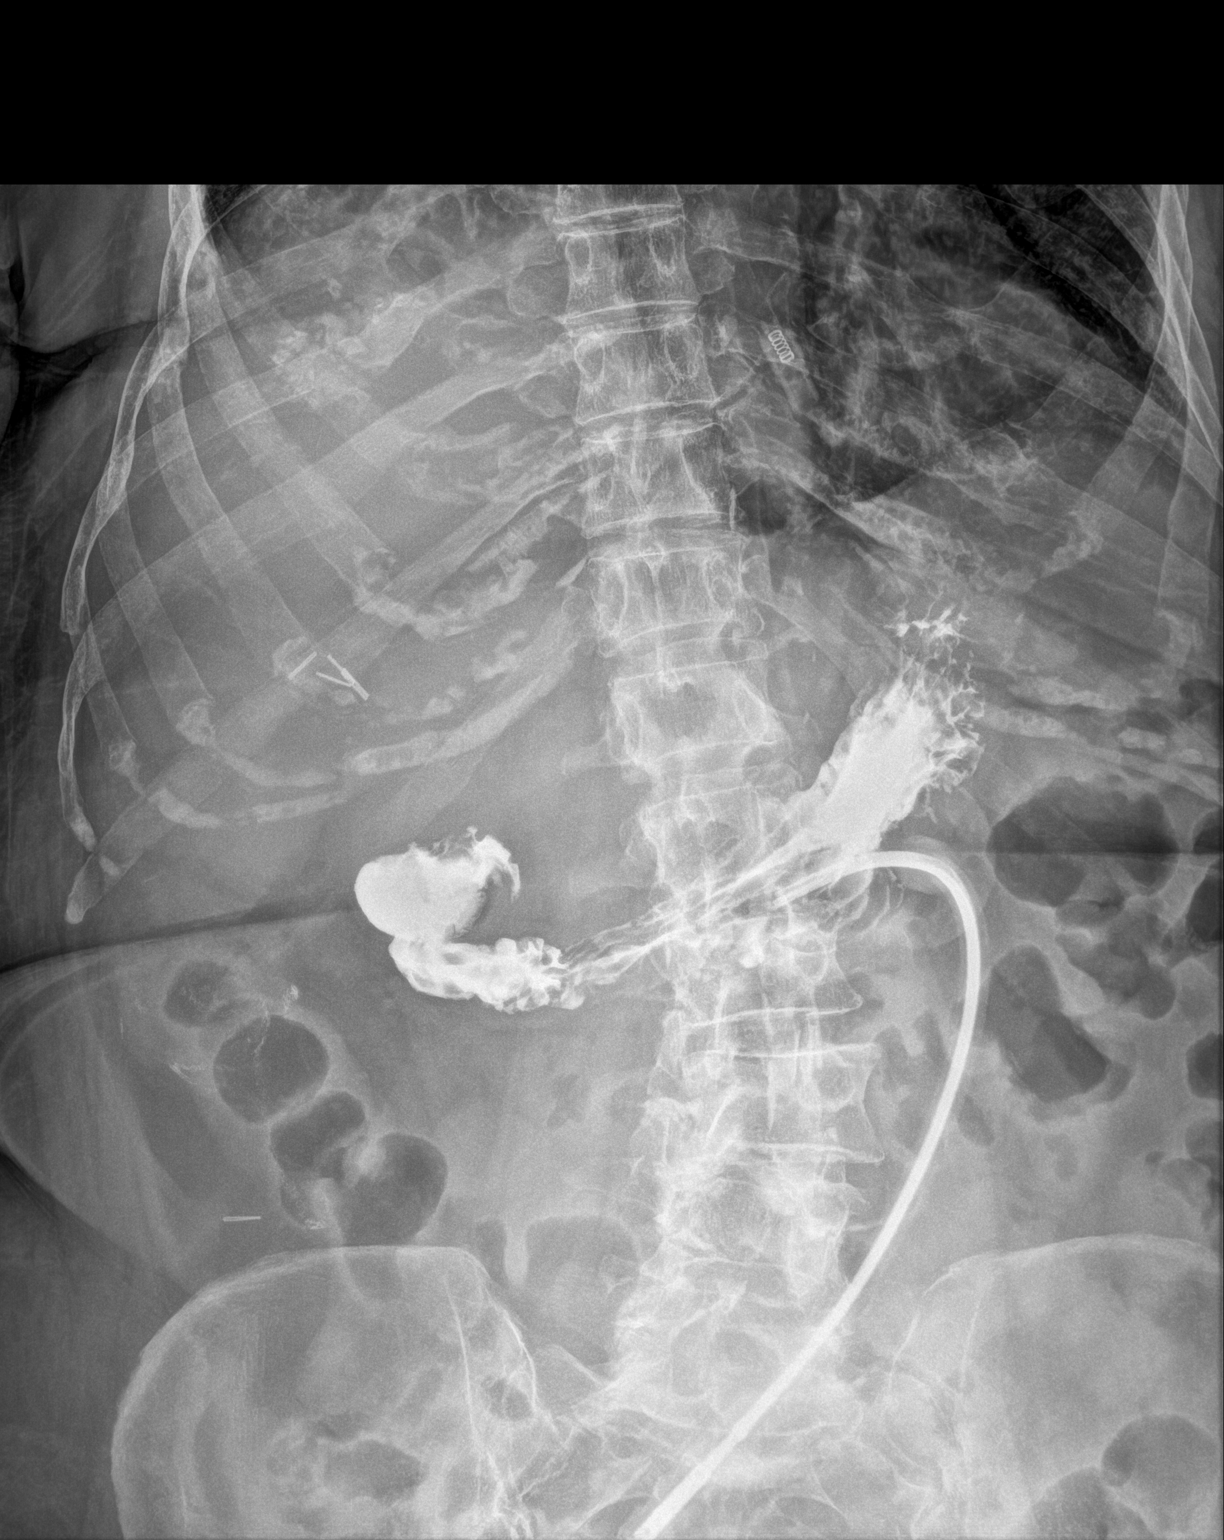

[1 of 1 positions shown; findings below may reference images not displayed]

FINDINGS: Single abdominal radiograph obtained following administration of
contrast through the patient's PEG tube. Peg tube projects over the
gastric body. Contrast opacifies the stomach. No gross
extravasation.
IMPRESSION: Gastrostomy tube within body of stomach.  No gross extravasation.

## 2022-11-22 ENCOUNTER — Other Ambulatory Visit: Payer: Self-pay

## 2022-11-22 ENCOUNTER — Emergency Department
Admission: EM | Admit: 2022-11-22 | Discharge: 2022-11-23 | Disposition: A | Discharge status: Home / Self Care | Payer: Medicare Other | Attending: Emergency Medicine | Admitting: Emergency Medicine

## 2022-11-22 ENCOUNTER — Encounter: Payer: Self-pay | Admitting: *Deleted

## 2022-11-22 DIAGNOSIS — R739 Hyperglycemia, unspecified: Secondary | ICD-10-CM | POA: Diagnosis present

## 2022-11-22 DIAGNOSIS — Z853 Personal history of malignant neoplasm of breast: Secondary | ICD-10-CM | POA: Diagnosis not present

## 2022-11-22 DIAGNOSIS — N390 Urinary tract infection, site not specified: Secondary | ICD-10-CM | POA: Insufficient documentation

## 2022-11-22 DIAGNOSIS — E1165 Type 2 diabetes mellitus with hyperglycemia: Secondary | ICD-10-CM | POA: Diagnosis not present

## 2022-11-22 LAB — CBC
HCT: 46.1 % — ABNORMAL HIGH (ref 36.0–46.0)
Hemoglobin: 14.7 g/dL (ref 12.0–15.0)
MCH: 28 pg (ref 26.0–34.0)
MCHC: 31.9 g/dL (ref 30.0–36.0)
MCV: 87.8 fL (ref 80.0–100.0)
Platelets: 214 10*3/uL (ref 150–400)
RBC: 5.25 MIL/uL — ABNORMAL HIGH (ref 3.87–5.11)
RDW: 12.3 % (ref 11.5–15.5)
WBC: 9 10*3/uL (ref 4.0–10.5)
nRBC: 0 % (ref 0.0–0.2)

## 2022-11-22 LAB — BASIC METABOLIC PANEL
Anion gap: 12 (ref 5–15)
BUN: 25 mg/dL — ABNORMAL HIGH (ref 8–23)
CO2: 23 mmol/L (ref 22–32)
Calcium: 9.4 mg/dL (ref 8.9–10.3)
Chloride: 101 mmol/L (ref 98–111)
Creatinine, Ser: 0.85 mg/dL (ref 0.44–1.00)
GFR, Estimated: >60 mL/min (ref >60)
Glucose, Bld: 187 mg/dL — ABNORMAL HIGH (ref 70–99)
Potassium: 4.6 mmol/L (ref 3.5–5.1)
Sodium: 136 mmol/L (ref 135–145)

## 2022-11-22 LAB — CBG MONITORING, ED: Glucose-Capillary: 179 mg/dL — ABNORMAL HIGH (ref 70–99)

## 2022-11-22 IMAGING — CT CT HEAD W/O CM
3 series · 15 of 47 positions shown, 18 images · non-contrast
Comparison: CT head 01/23/2020.  MRI cervical spine 06/11/2020.

CLINICAL DATA: Minor head trauma.  Normal mental status.  Headache.

EXAM:
CT HEAD WITHOUT CONTRAST
CT CERVICAL SPINE WITHOUT CONTRAST
TECHNIQUE: Multidetector CT imaging of the head and cervical spine was
performed following the standard protocol without intravenous
contrast. Multiplanar CT image reconstructions of the cervical spine
were also generated.

[Series 2: head wo · axial · 0.43mm/px · z∈[+255,+390]mm · 9 of 33 slices shown, 12 images]
[im 3/33  brain]
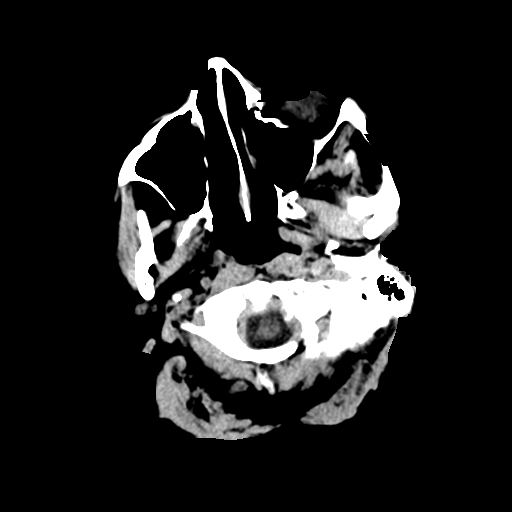
[im 3/33  bone]
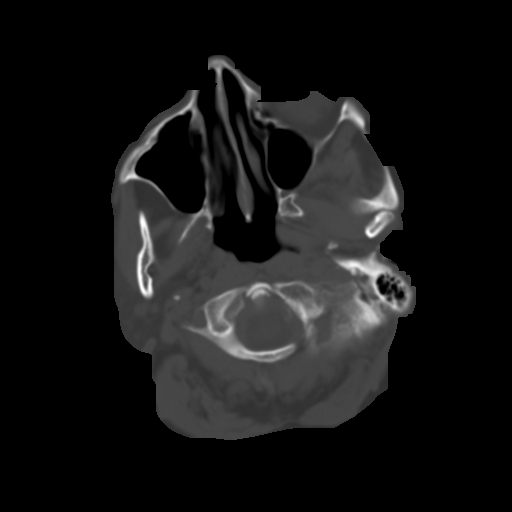
[im 6/33  brain]
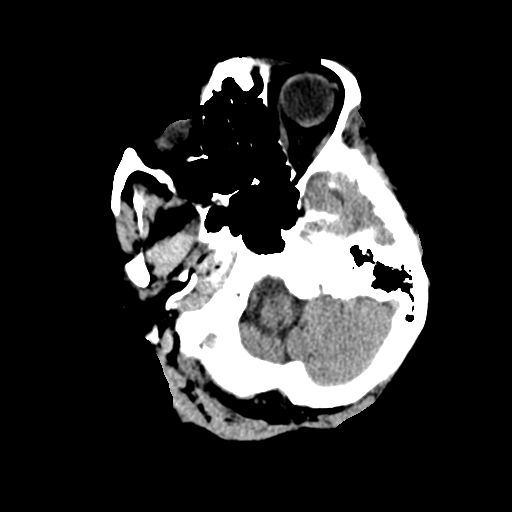
[im 9/33  brain]
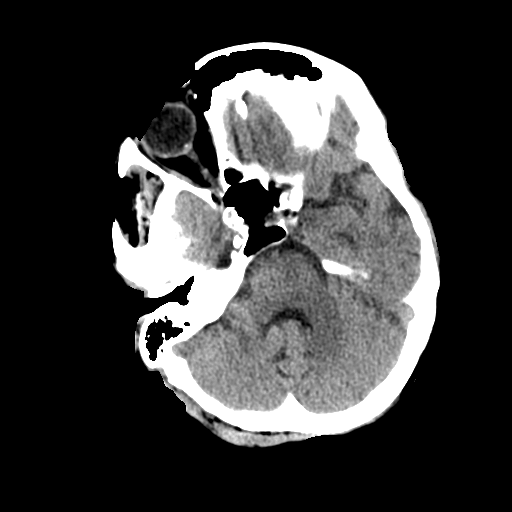
[im 13/33  brain]
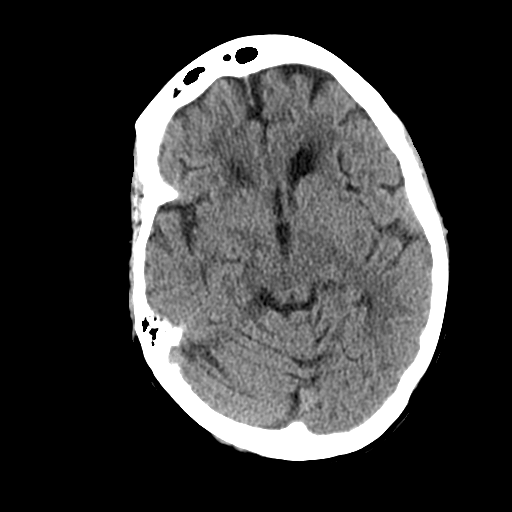
[im 17/33  brain]
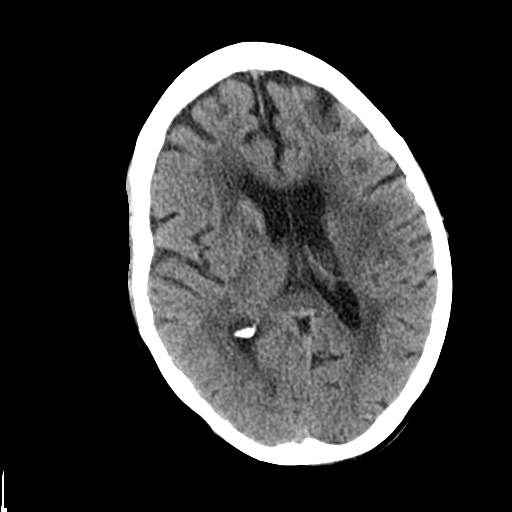
[im 17/33  bone]
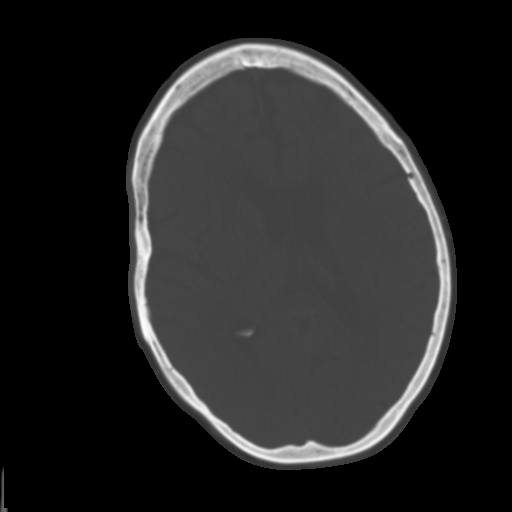
[im 20/33  brain]
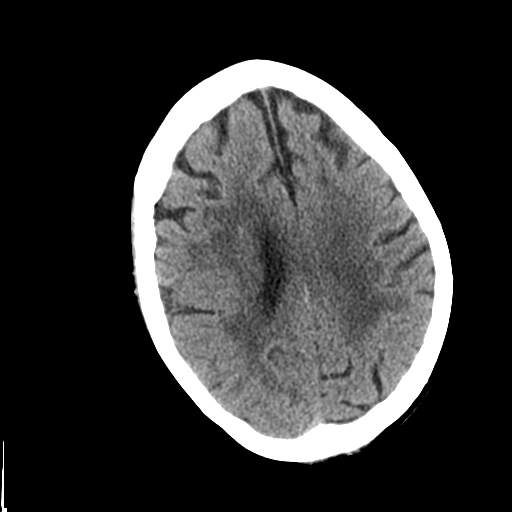
[im 24/33  brain]
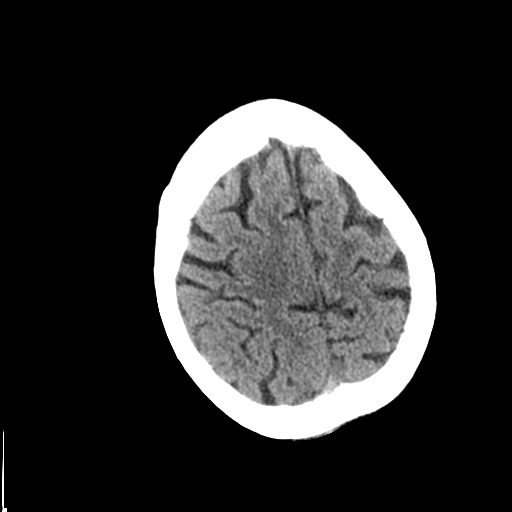
[im 27/33  brain]
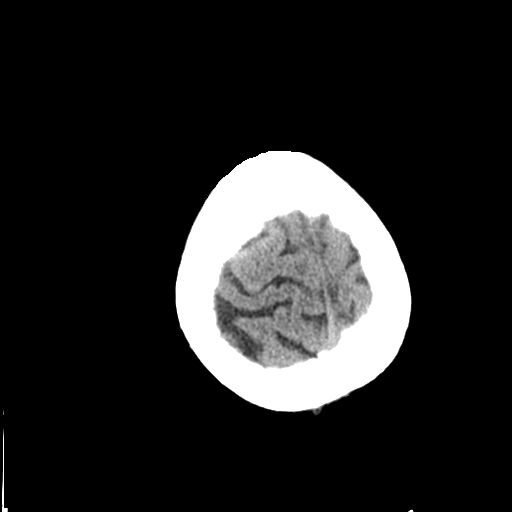
[im 30/33  brain]
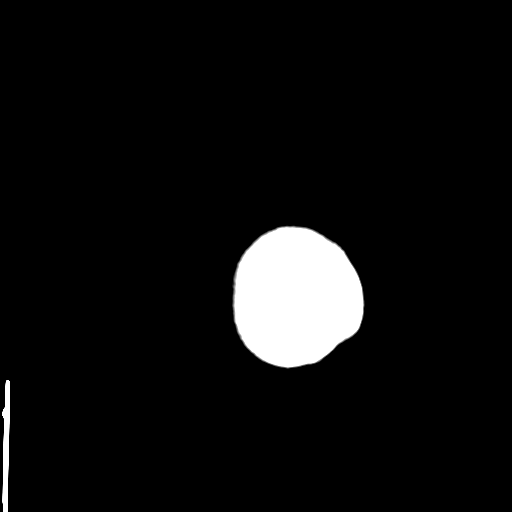
[im 30/33  bone]
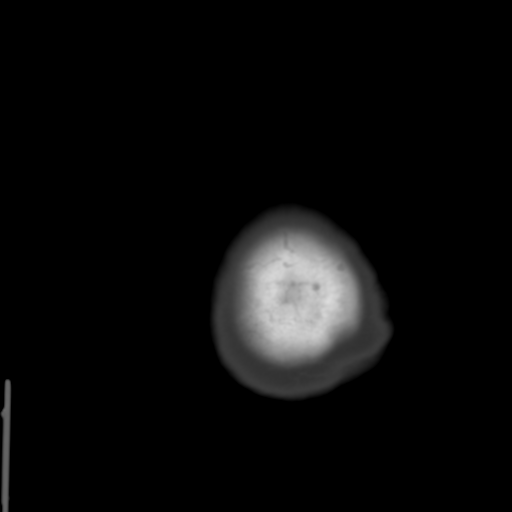

[Series 4: coronal soft tissue · coronal · 0.33mm/px · 3 of 69 slices shown]
[im 23/69  brain]
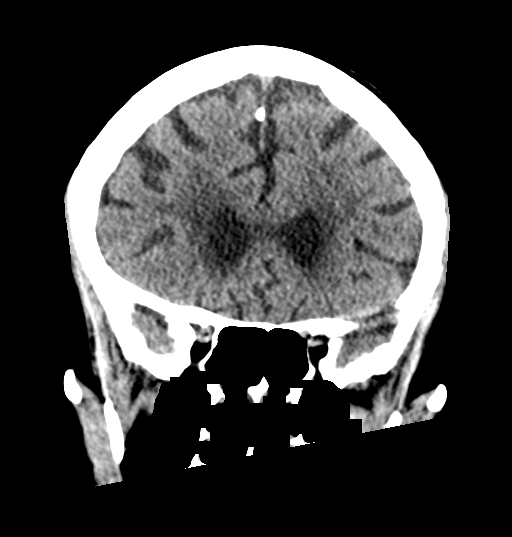
[im 31/69  brain]
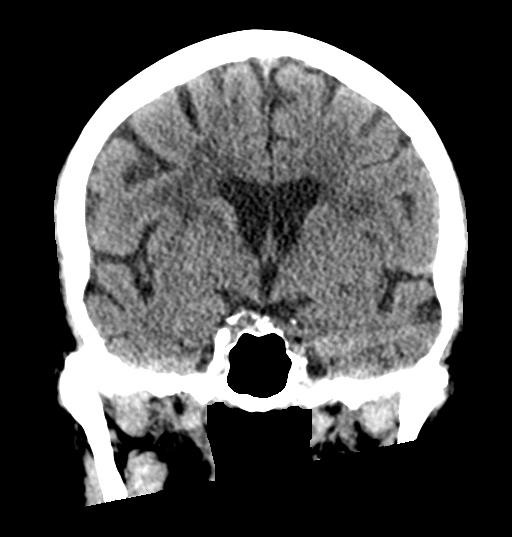
[im 38/69  brain]
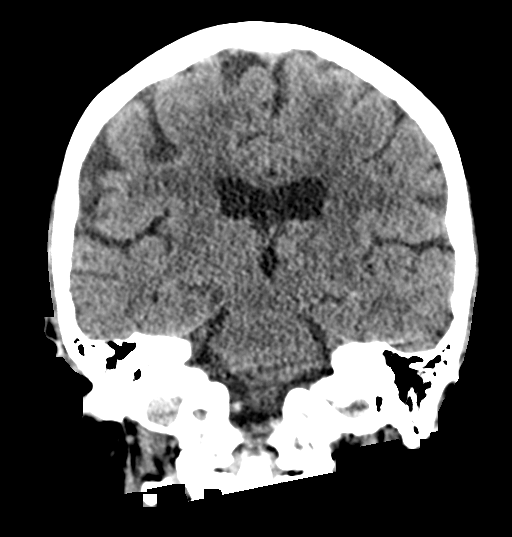

[Series 5: sagittal soft tissue · sagittal · 0.38mm/px · 3 of 54 slices shown]
[im 21/54  brain]
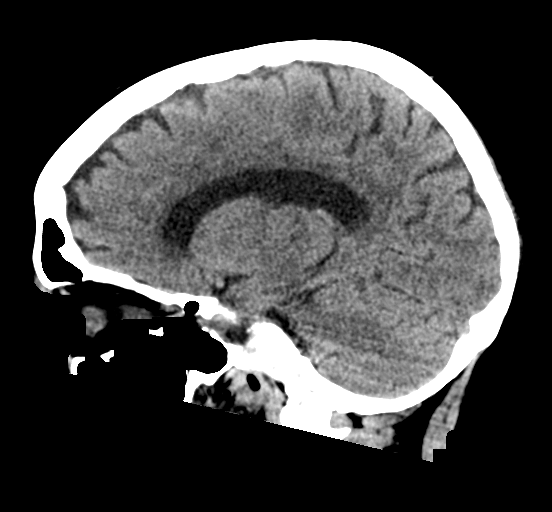
[im 27/54  brain]
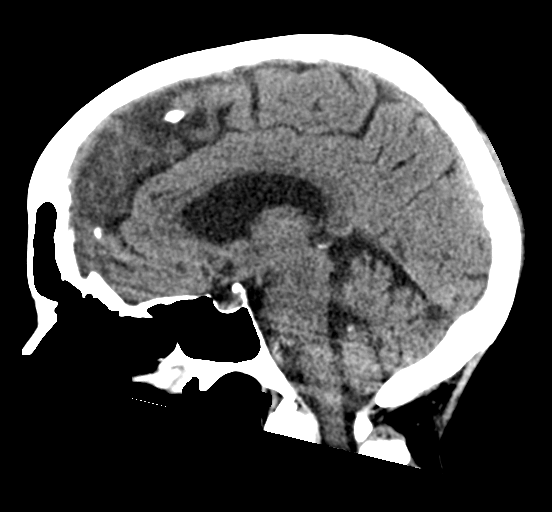
[im 33/54  brain]
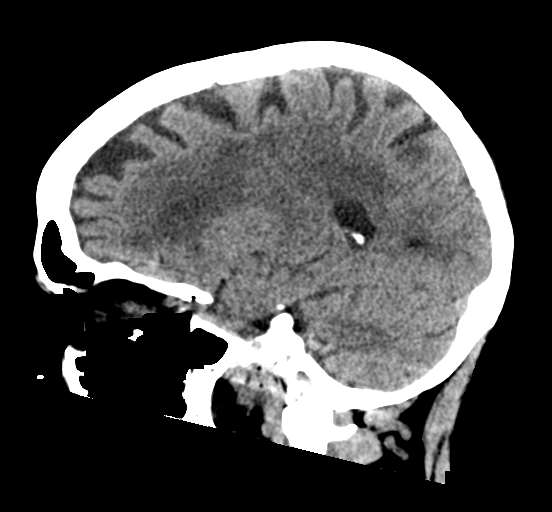

[15 of 47 positions shown; findings below may reference images not displayed]

FINDINGS: CT HEAD FINDINGS

Brain: There is no evidence of acute intracranial hemorrhage, mass
lesion, brain edema or extra-axial fluid collection. The ventricles
and subarachnoid spaces are appropriately sized for age. Fairly
extensive chronic periventricular white matter disease is again
noted, grossly stable and likely due to chronic small vessel
ischemic changes. No evidence of acute cortical based infarct.

Vascular: Intracranial vascular calcifications. No hyperdense vessel
identified.

Skull: Negative for fracture or focal lesion.

Sinuses/Orbits: The visualized paranasal sinuses and mastoid air
cells are clear. No orbital abnormalities are seen.

Other: None.

CT CERVICAL SPINE FINDINGS

Alignment: Straightening.  No focal angulation or listhesis.

Skull base and vertebrae: No evidence of acute fracture or traumatic
subluxation.

Soft tissues and spinal canal: No prevertebral fluid or swelling. No
visible canal hematoma.

Disc levels: Stable mild spondylosis with loss of disc height,
posterior osteophytes and uncinate spurring greatest at C5-6 and
C6-7. Mild associated biforaminal narrowing.

Upper chest: Tracheostomy in place. Chronic dilatation of the
thoracic esophagus, similar to previous chest CT 01/17/2020.
Emphysematous changes and scattered nodularity at both lung apices
appears stable. There are probable radiation changes anteriorly in
the right upper lobe.

Other: Stable sclerosis of the right clavicular head and old
right-sided rib fractures. Bilateral carotid atherosclerosis.
IMPRESSION: 1. No acute intracranial or calvarial findings.
2. Stable extensive chronic small vessel ischemic changes in the
periventricular white matter.
3. No evidence of acute cervical spine fracture, traumatic
subluxation or static signs of instability.
4. Stable mild cervical spondylosis and incidental findings in the
upper chest.

## 2022-11-22 IMAGING — CR DG HIP (WITH OR WITHOUT PELVIS) 2-3V*R*
3 series · 3 of 3 positions shown · non-contrast
Comparison: None.

CLINICAL DATA: Hip pain after a fall.

EXAM:
DG HIP (WITH OR WITHOUT PELVIS) 2-3V RIGHT

[pelvis ap]
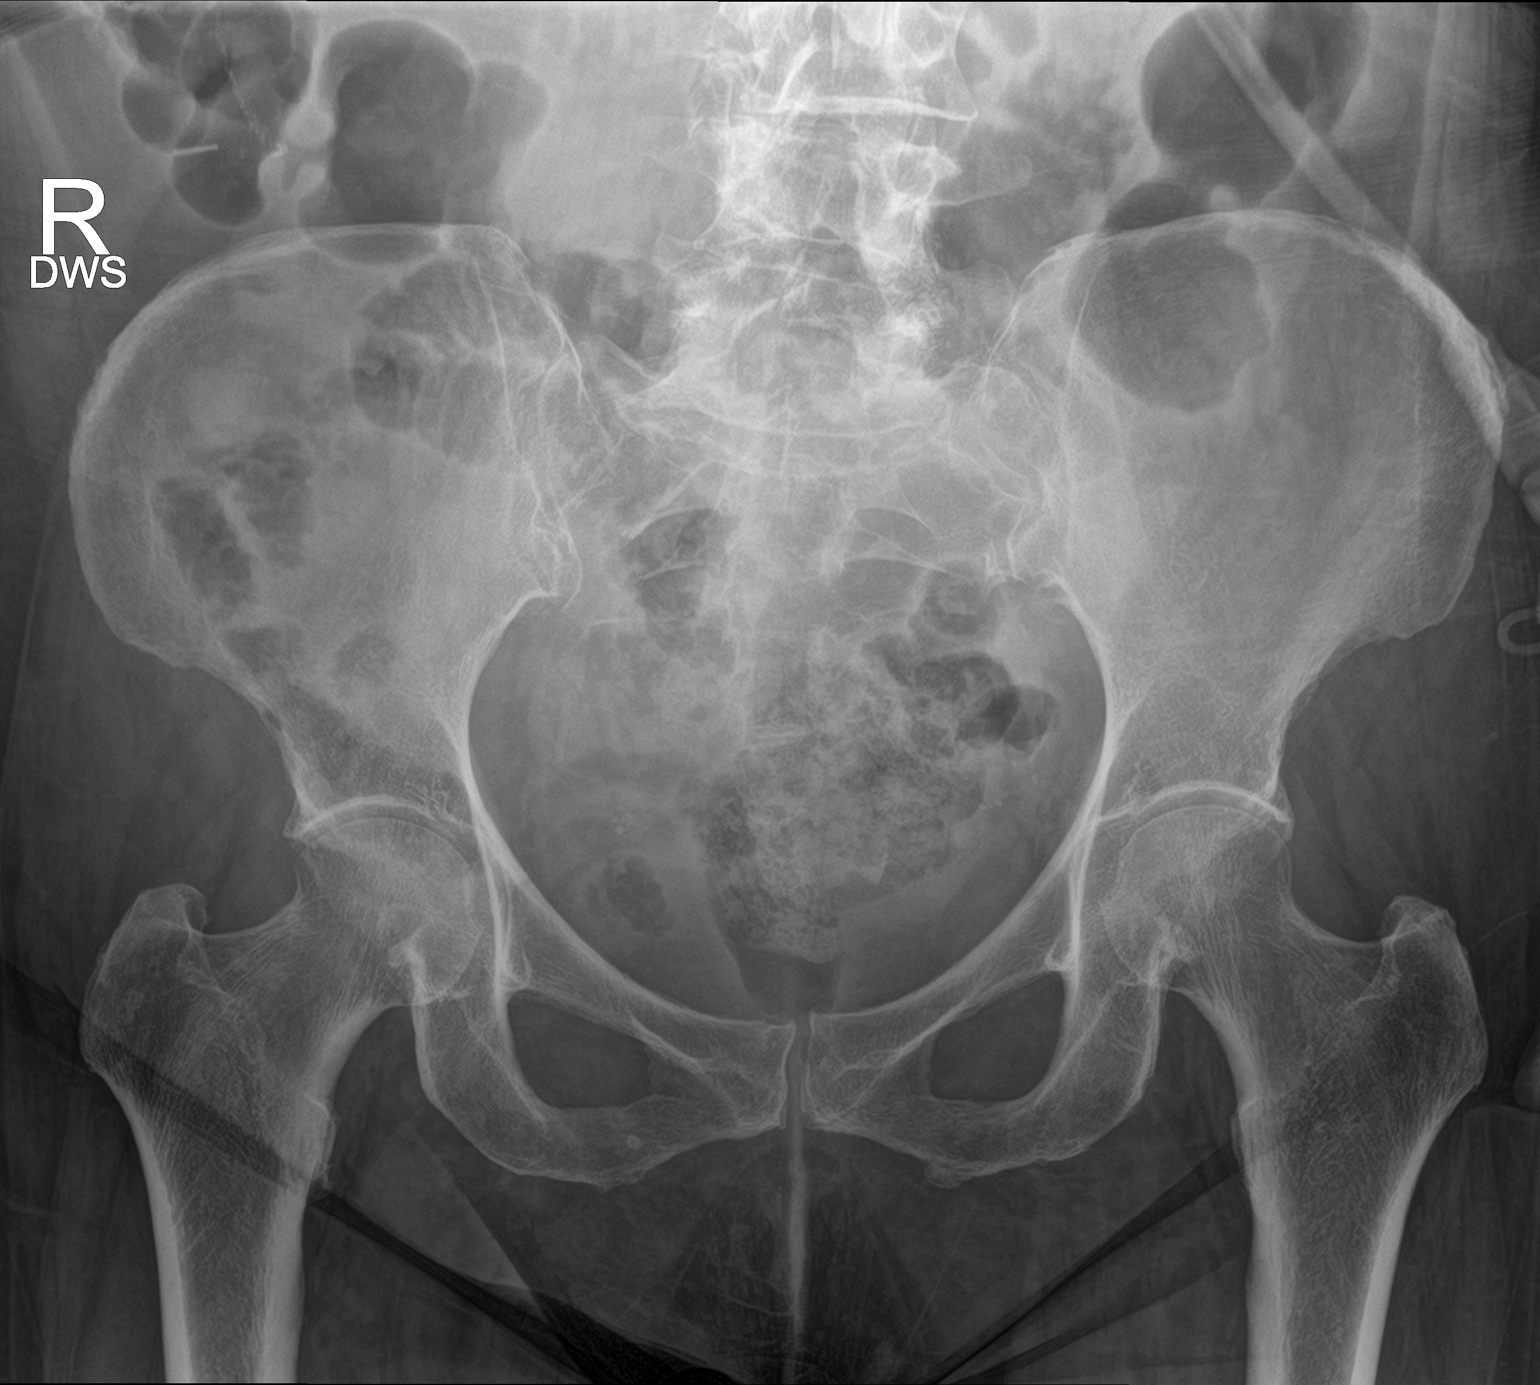

[hip ap]
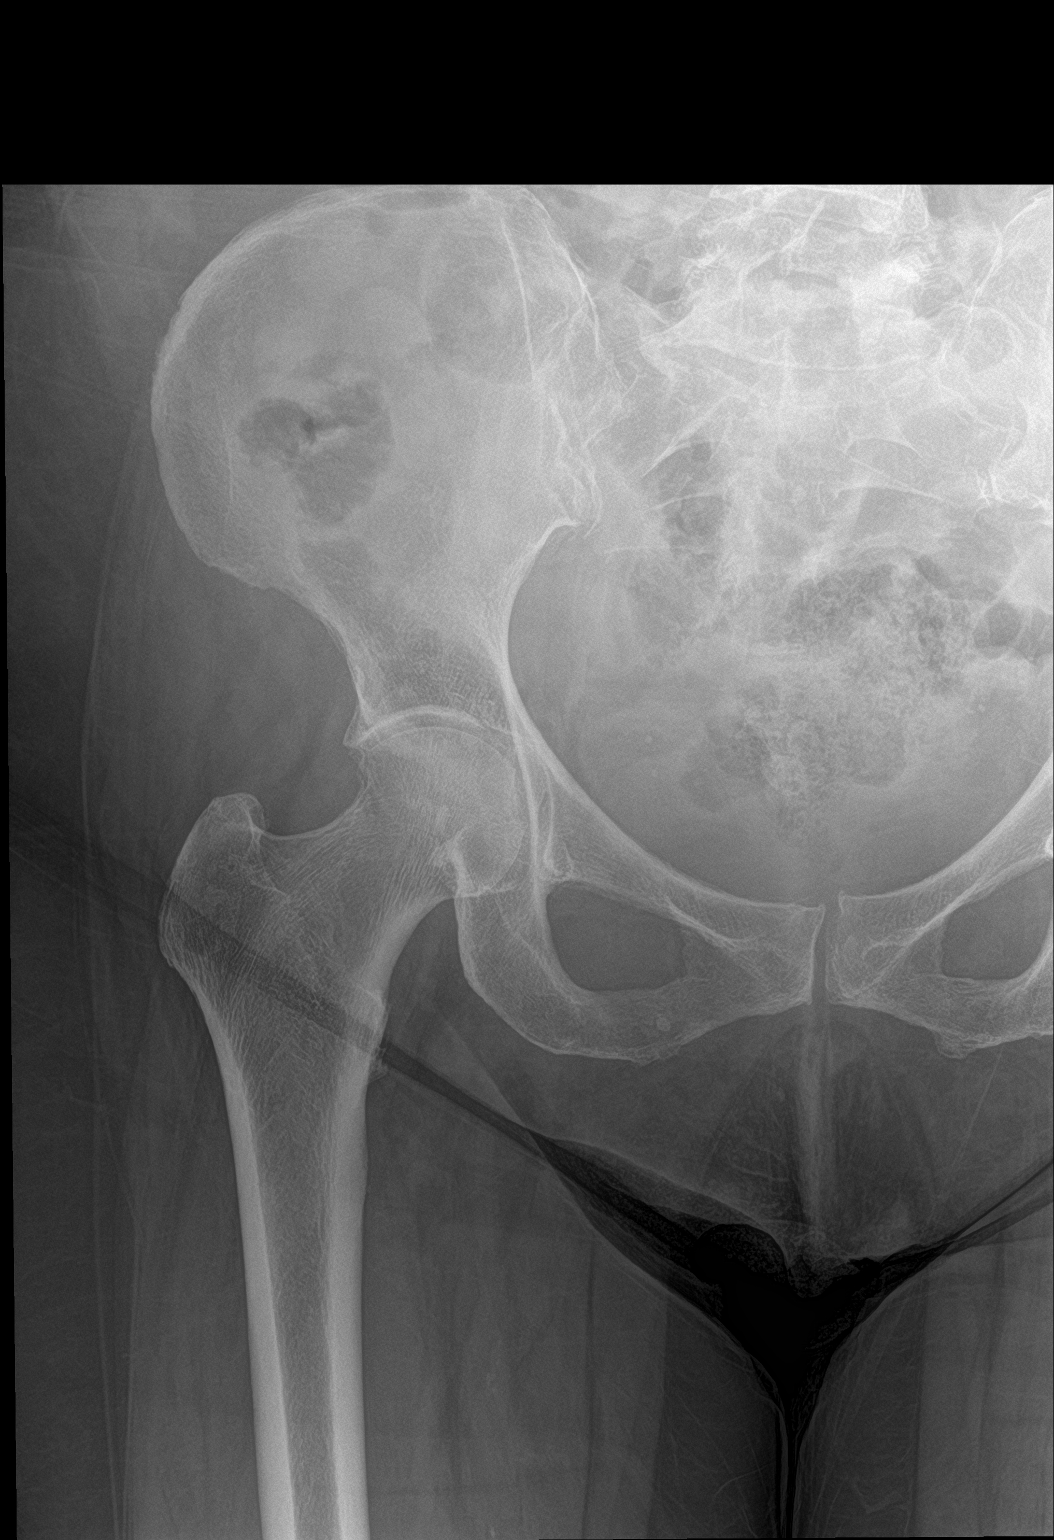

[hip lat]
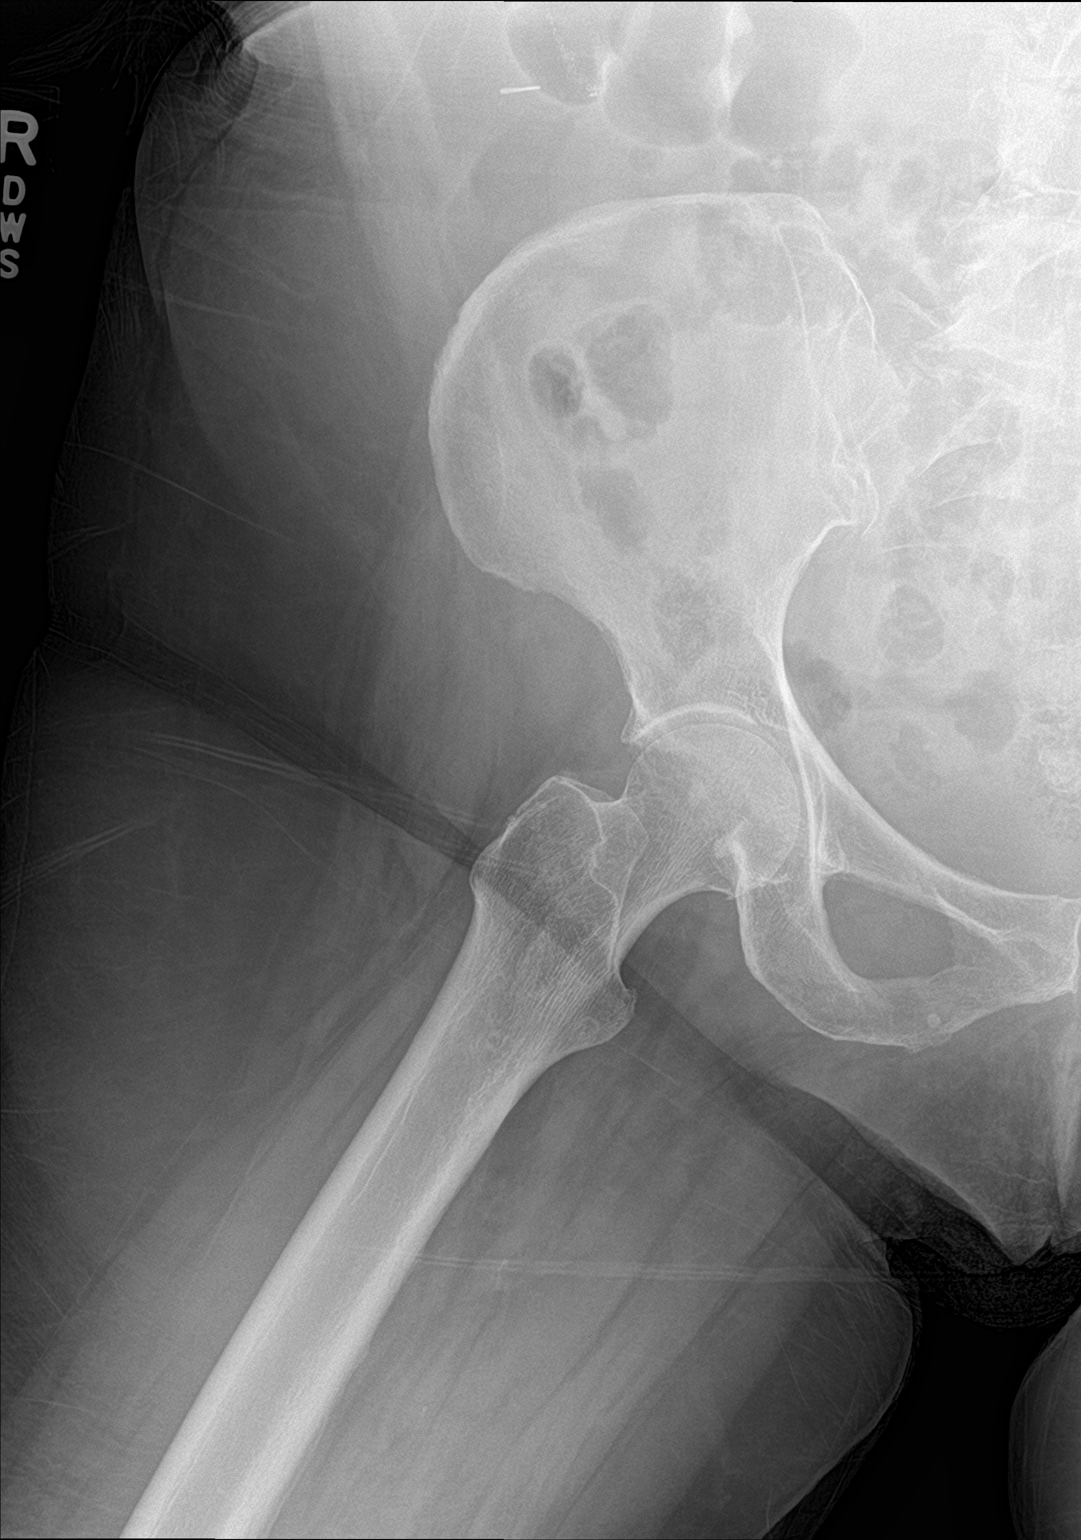

[3 of 3 positions shown; findings below may reference images not displayed]

FINDINGS: There is no evidence of hip fracture or dislocation. Mild
degenerative changes are seen in both hips.
IMPRESSION: No acute osseous injury.

## 2022-11-22 IMAGING — CT CT CERVICAL SPINE W/O CM
2 series · 11 of 29 positions shown, 14 images · non-contrast
Comparison: CT head 01/23/2020.  MRI cervical spine 06/11/2020.

CLINICAL DATA: Minor head trauma.  Normal mental status.  Headache.

EXAM:
CT HEAD WITHOUT CONTRAST
CT CERVICAL SPINE WITHOUT CONTRAST
TECHNIQUE: Multidetector CT imaging of the head and cervical spine was
performed following the standard protocol without intravenous
contrast. Multiplanar CT image reconstructions of the cervical spine
were also generated.

[Series 3: c spine soft · axial · 0.39mm/px · z∈[+127,+255]mm · 6 of 84 slices shown, 8 images]
[im 13/84  soft-tissue]
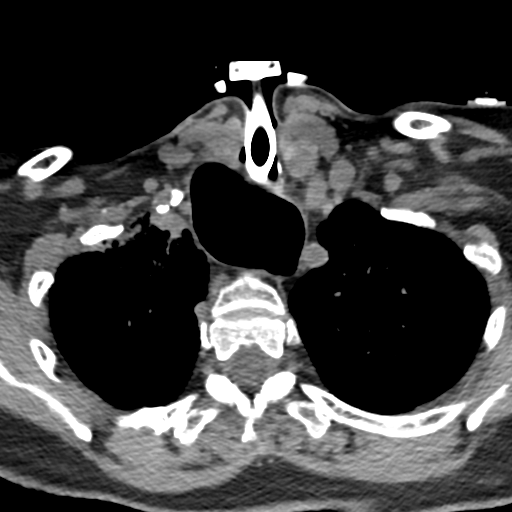
[im 13/84  bone]
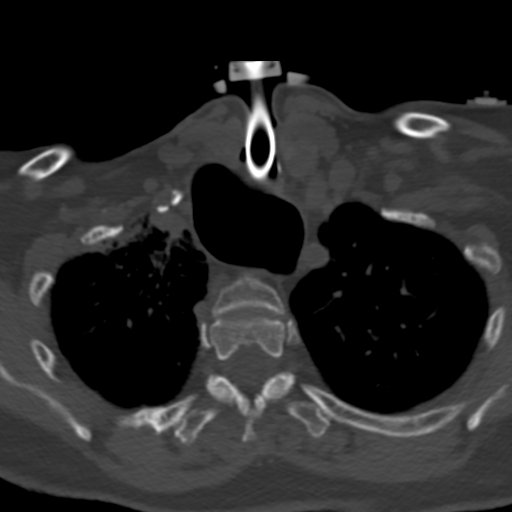
[im 26/84  bone]
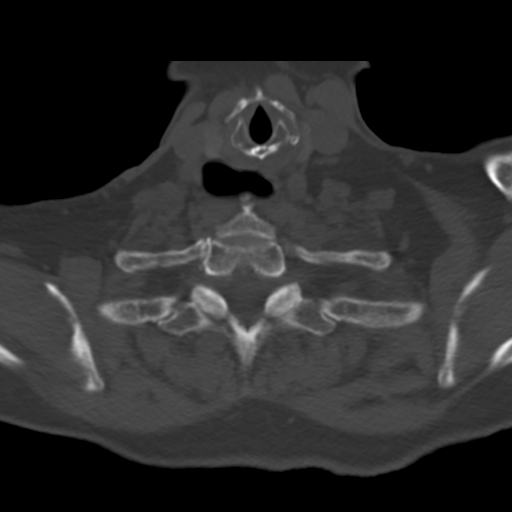
[im 39/84  bone]
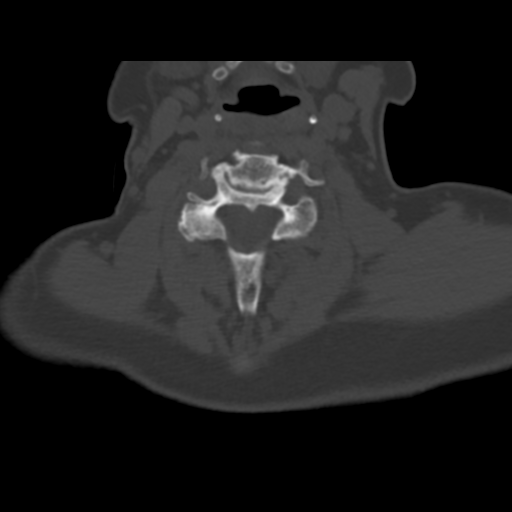
[im 52/84  bone]
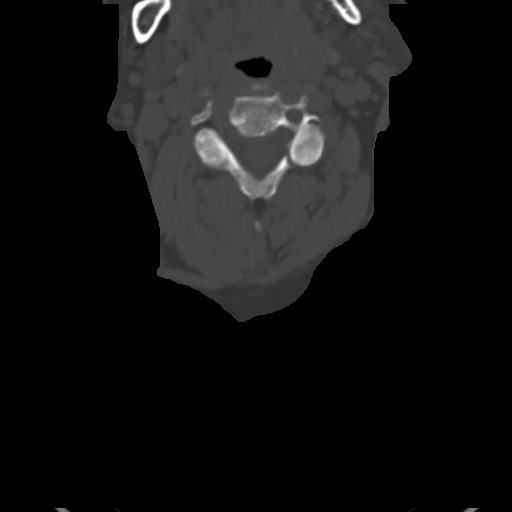
[im 64/84  soft-tissue]
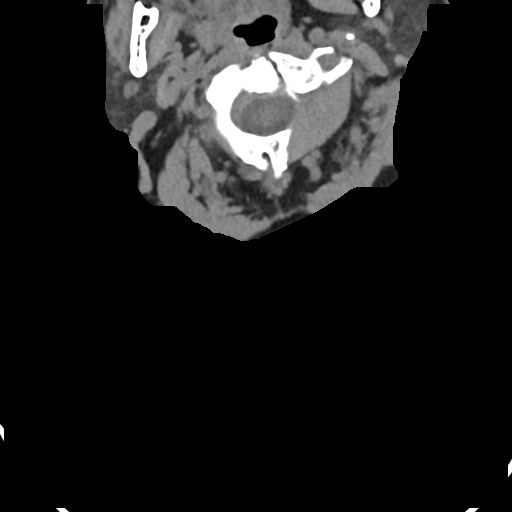
[im 64/84  bone]
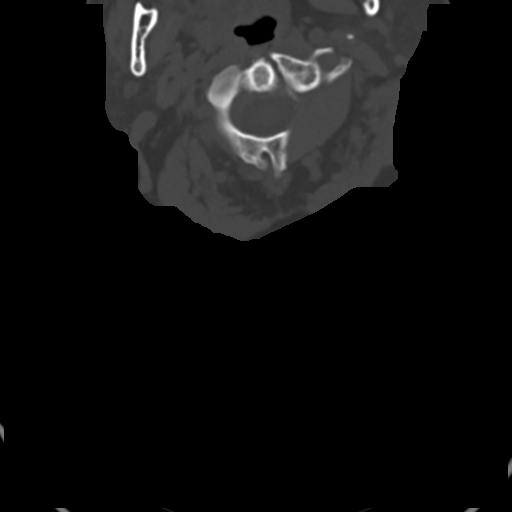
[im 77/84  bone]
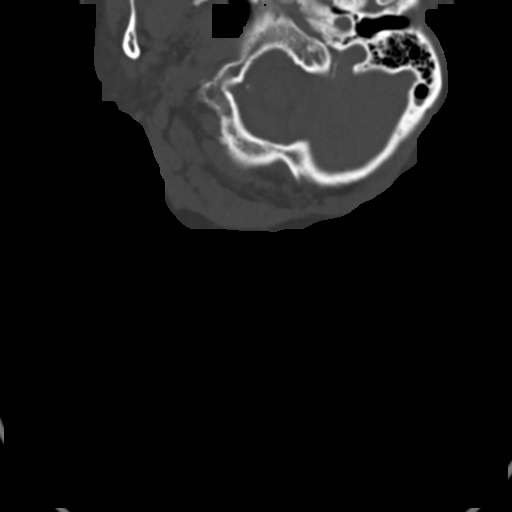

[Series 7: sagittal bone · sagittal · 0.29mm/px · 5 of 57 slices shown, 6 images]
[im 19/57  bone]
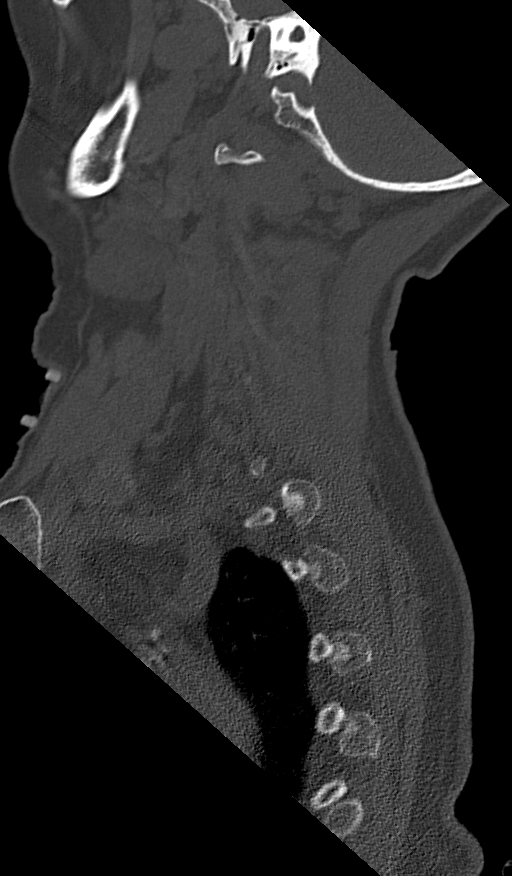
[im 24/57  bone]
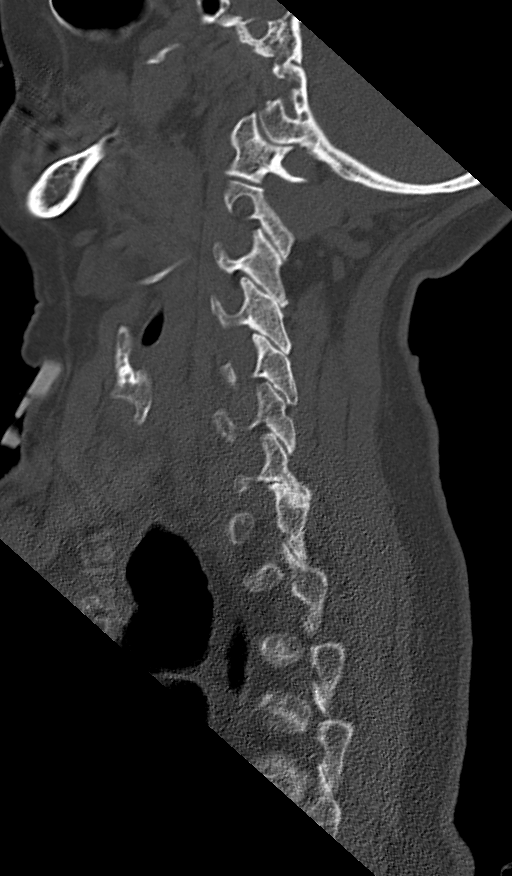
[im 29/57  soft-tissue]
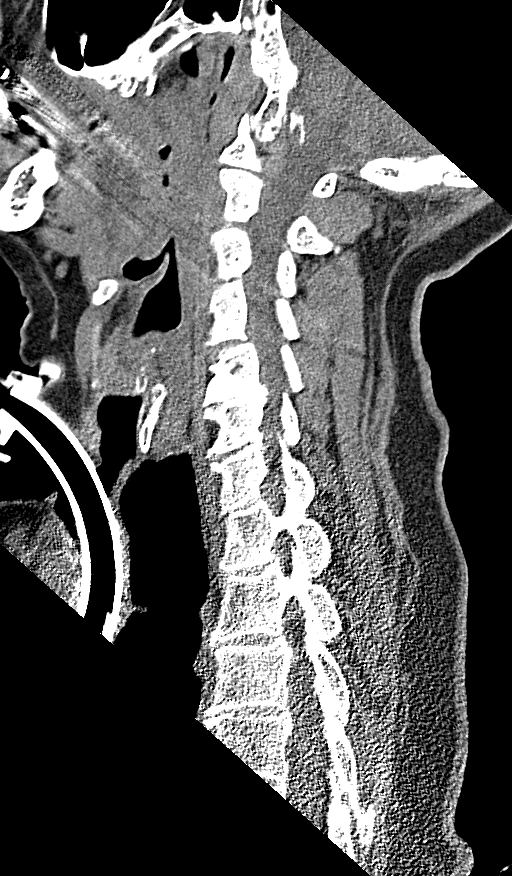
[im 29/57  bone]
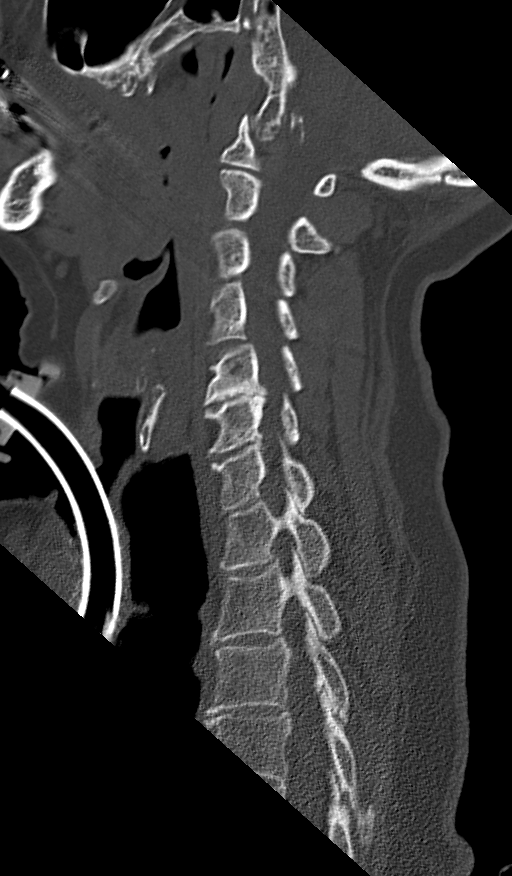
[im 33/57  bone]
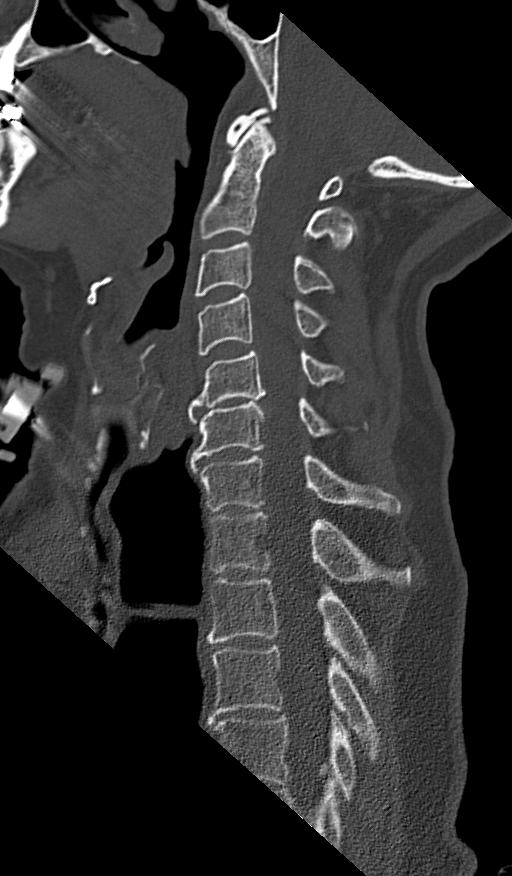
[im 38/57  bone]
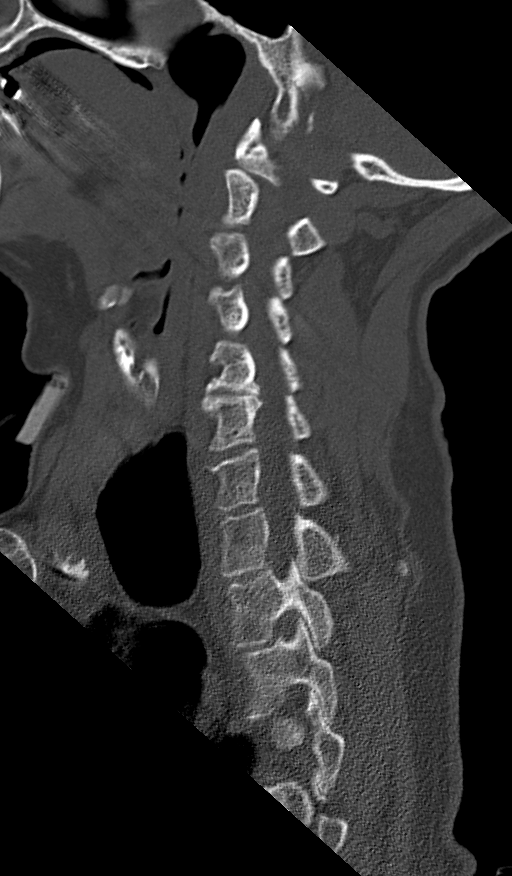

[11 of 29 positions shown; findings below may reference images not displayed]

FINDINGS: CT HEAD FINDINGS

Brain: There is no evidence of acute intracranial hemorrhage, mass
lesion, brain edema or extra-axial fluid collection. The ventricles
and subarachnoid spaces are appropriately sized for age. Fairly
extensive chronic periventricular white matter disease is again
noted, grossly stable and likely due to chronic small vessel
ischemic changes. No evidence of acute cortical based infarct.

Vascular: Intracranial vascular calcifications. No hyperdense vessel
identified.

Skull: Negative for fracture or focal lesion.

Sinuses/Orbits: The visualized paranasal sinuses and mastoid air
cells are clear. No orbital abnormalities are seen.

Other: None.

CT CERVICAL SPINE FINDINGS

Alignment: Straightening.  No focal angulation or listhesis.

Skull base and vertebrae: No evidence of acute fracture or traumatic
subluxation.

Soft tissues and spinal canal: No prevertebral fluid or swelling. No
visible canal hematoma.

Disc levels: Stable mild spondylosis with loss of disc height,
posterior osteophytes and uncinate spurring greatest at C5-6 and
C6-7. Mild associated biforaminal narrowing.

Upper chest: Tracheostomy in place. Chronic dilatation of the
thoracic esophagus, similar to previous chest CT 01/17/2020.
Emphysematous changes and scattered nodularity at both lung apices
appears stable. There are probable radiation changes anteriorly in
the right upper lobe.

Other: Stable sclerosis of the right clavicular head and old
right-sided rib fractures. Bilateral carotid atherosclerosis.
IMPRESSION: 1. No acute intracranial or calvarial findings.
2. Stable extensive chronic small vessel ischemic changes in the
periventricular white matter.
3. No evidence of acute cervical spine fracture, traumatic
subluxation or static signs of instability.
4. Stable mild cervical spondylosis and incidental findings in the
upper chest.

## 2022-11-22 NOTE — ED Triage Notes (Signed)
Pt to triage via wheelchair.  Pt reports high blood sugar of 250 today.  Pt has a trach. And feeding tube.  Pt denies any pain.  Pt alert.

## 2022-11-22 NOTE — ED Notes (Signed)
Fsbs 179 in triage

## 2022-11-23 DIAGNOSIS — E1165 Type 2 diabetes mellitus with hyperglycemia: Secondary | ICD-10-CM | POA: Diagnosis not present

## 2022-11-23 LAB — URINALYSIS, ROUTINE W REFLEX MICROSCOPIC
Bilirubin Urine: NEGATIVE
Glucose, UA: NEGATIVE mg/dL
Ketones, ur: NEGATIVE mg/dL
Nitrite: POSITIVE — AB
Protein, ur: 30 mg/dL — AB
Specific Gravity, Urine: 1.018 (ref 1.005–1.030)
Squamous Epithelial / HPF: 0 /[HPF] (ref 0–5)
WBC, UA: >50 WBC/hpf (ref 0–5)
pH: 5 (ref 5.0–8.0)

## 2022-11-23 LAB — CBG MONITORING, ED: Glucose-Capillary: 149 mg/dL — ABNORMAL HIGH (ref 70–99)

## 2022-11-23 MED ORDER — CEPHALEXIN 500 MG PO CAPS
500.0000 mg | ORAL_CAPSULE | Freq: Once | ORAL | Status: DC
  Filled 2022-11-23: qty 1

## 2022-11-23 MED ORDER — CEPHALEXIN 500 MG PO CAPS: 500.0000 mg | ORAL_CAPSULE | Freq: Four times a day (QID) | ORAL | 0 refills | Status: AC

## 2022-11-23 MED ORDER — CEPHALEXIN 500 MG PO CAPS
500.0000 mg | ORAL_CAPSULE | Freq: Once | ORAL | Status: AC
  Administered 2022-11-23: 500 mg

## 2022-11-23 NOTE — ED Provider Notes (Signed)
[facility] Provider Note    Event Date/Time   First MD Initiated Contact with Patient 11/22/22 2359     (approximate)   History   Hyperglycemia   HPI  Angela David is a 82 y.o. female   Past medical history of diabetes, remote breast cancer, chronic respiratory failure status post tracheostomy, GERD, hyperlipidemia, who presents to the emergency department with high blood sugars and urinary frequency.  She has been compliant with her diabetes medications but noticed that in the setting of urinary frequency she has had blood sugars as high as the 200s.  She took some extra insulin and is back down to normal now.  She denies fever or chills.  Otherwise feels well no other acute medical complaints and she does note a history of recurrent urinary tract infections.  Independent Historian contributed to assessment above: Family members at bedside corroborate information past medical history as above  External Medical Documents Reviewed: Prior urine culture sensitivities dated April 2024      Physical Exam   Triage Vital Signs: ED Triage Vitals  Encounter Vitals Group     BP 11/22/22 2105 (!) 122/58     Systolic BP Percentile --      Diastolic BP Percentile --      Pulse Rate 11/22/22 2105 86     Resp 11/22/22 2105 (!) 22     Temp 11/22/22 2105 98.4 F (36.9 C)     Temp Source 11/22/22 2105 Oral     SpO2 11/22/22 2105 97 %     Weight 11/22/22 2106 150 lb (68 kg)     Height 11/22/22 2106 5\' 4"  (1.626 m)     Head Circumference --      Peak Flow --      Pain Score 11/22/22 2106 0     Pain Loc --      Pain Education --      Exclude from Growth Chart --     Most recent vital signs: Vitals:   11/22/22 2105 11/23/22 0100  BP: (!) 122/58 (!) 100/48  Pulse: 86 77  Resp: (!) 22   Temp: 98.4 F (36.9 C)   SpO2: 97% 96%    General: Awake, no distress.  CV:  Good peripheral perfusion.  Resp:  Normal effort.  Abd:  No distention.   Other:  Awake alert well-appearing pleasant no acute distress.  Soft nontender abdomen, skin appears warm well-perfused.  Afebrile.   ED Results / Procedures / Treatments   Labs (all labs ordered are listed, but only abnormal results are displayed) Labs Reviewed  BASIC METABOLIC PANEL - Abnormal; Notable for the following components:      Result Value   Glucose, Bld 187 (*)    BUN 25 (*)    All other components within normal limits  CBC - Abnormal; Notable for the following components:   RBC 5.25 (*)    HCT 46.1 (*)    All other components within normal limits  URINALYSIS, ROUTINE W REFLEX MICROSCOPIC - Abnormal; Notable for the following components:   Color, Urine YELLOW (*)    APPearance CLOUDY (*)    Hgb urine dipstick SMALL (*)    Protein, ur 30 (*)    Nitrite POSITIVE (*)    Leukocytes,Ua LARGE (*)    Bacteria, UA FEW (*)    All other components within normal limits  CBG MONITORING, ED - Abnormal; Notable for the following components:   Glucose-Capillary 179 (*)  All other components within normal limits  CBG MONITORING, ED - Abnormal; Notable for the following components:   Glucose-Capillary 149 (*)    All other components within normal limits     I ordered and reviewed the above labs they are notable for bacteriuria and leukocytes in the urine and a blood sugar in the 170s.  PROCEDURES:  Critical Care performed: No  Procedures   MEDICATIONS ORDERED IN ED: Medications  cephALEXin (KEFLEX) capsule 500 mg (500 mg Per Tube Given 11/23/22 0112)    IMPRESSION / MDM / ASSESSMENT AND PLAN / ED COURSE  I reviewed the triage vital signs and the nursing notes.                                Patient's presentation is most consistent with acute presentation with potential threat to life or bodily function.  Differential diagnosis includes, but is not limited to, urinary tract infection, hyperglycemia, DKA   The patient is on the cardiac monitor to evaluate  for evidence of arrhythmia and/or significant heart rate changes.  MDM:    Patient with recurrent urinary tract infections and symptoms of urinary tract infection with urinary frequency and indeed her urinalysis does show some bacteria and inflammatory changes in the urine consistent for UTI so we will treat with antibiotics.  I think this accounts for her mildly worsened hyperglycemia with no evidence of DKA.  Will continue diabetic management as outpatient and consult with her primary doctor for follow-up, and take antibiotics outpatient treatment, considered admission for UTI but given no fever, mild symptoms, no leukocytosis and hemodynamic stability I doubt sepsis or severe infection at this time and can be treated as outpatient.       FINAL CLINICAL IMPRESSION(S) / ED DIAGNOSES   Final diagnoses:  Hyperglycemia  Lower urinary tract infectious disease     Rx / DC Orders   ED Discharge Orders          Ordered    cephALEXin (KEFLEX) 500 MG capsule  4 times daily        11/23/22 0100             Note:  This document was prepared using Dragon voice recognition software and may include unintentional dictation errors.    Pilar Jarvis, MD 11/23/22 [phone]

## 2022-11-23 NOTE — ED Notes (Signed)
CBG 149. 

## 2023-01-03 ENCOUNTER — Telehealth: Payer: Medicare Other | Admitting: Adult Health

## 2023-01-03 DIAGNOSIS — Z930 Tracheostomy status: Secondary | ICD-10-CM | POA: Diagnosis not present

## 2023-01-03 DIAGNOSIS — J9612 Chronic respiratory failure with hypercapnia: Secondary | ICD-10-CM | POA: Diagnosis not present

## 2023-01-03 DIAGNOSIS — J9611 Chronic respiratory failure with hypoxia: Secondary | ICD-10-CM

## 2023-01-03 MED ORDER — IPRATROPIUM-ALBUTEROL 0.5-2.5 (3) MG/3ML IN SOLN: 3.0000 mL | Freq: Four times a day (QID) | RESPIRATORY_TRACT | 5 refills | Status: DC | PRN

## 2023-01-03 NOTE — Progress Notes (Signed)
Virtual Visit via Video Note  I connected with Angela David on 01/03/23 at  2:30 PM EST by a video enabled telemedicine application and verified that I am speaking with the correct person using two identifiers.  Location: Patient: Home  Provider: Office    I discussed the limitations of evaluation and management by telemedicine and the availability of in person appointments. The patient expressed understanding and agreed to proceed.  History of Present Illness: 82 year old female followed for  chronic hypoxic and hypercarbic  respiratory failure status post trach and chronic vent support in setting of  vocal cord dysfunction and hx of MRSA pneumonia, history of breast cancer Has PEG tube.  Today's video visit is a 1 year follow-up.  Patient has COPD and chronic hypoxic and  hypercarbic respiratory failure with chronic trach on mechanical vent At bedtime.  Patient is accompanied by family members today.  Her husband is her caregiver.  Patient says overall breathing is doing about the same.  She gets David of breath intermittently.  She does have a DuoNeb nebulizer that she uses as needed.  Says she very rarely ever uses it.  She does have a chronic trach.  That she follows up with ENT about every 6 months.  Has had no issues with redness or drainage.  Says that home health respiratory therapist comes out every month to check on her ventilator that she uses each night.  She does have a PEG tube and does bolus feedings.  Also does all of her meds through the PEG tube.  She says she does occasionally eat or drink if she is having a good day with swallowing.  She denies any hemoptysis or chest pain.  Says her oxygen levels at home running anywhere between 90 to 97%.  Does not use any oxygen.  Does not use a Passy-Muir valve.  Just puts her finger over the trach if she wants to talk.  She declines flu shot..    Past Medical History:  Diagnosis Date   Breast cancer, right (HCC) 2002   Tx'd with  chemotherapy and XRT   Chronic respiratory failure (HCC)    s/p tracheostomy in 2012; on ventilatory support at hs   Diabetic peripheral neuropathy (HCC) 08/28/2016   GERD (gastroesophageal reflux disease)    Glaucoma    High cholesterol    History of colon polyps    Osteoarthritis    Personal history of chemotherapy 2002   BREAST CA   Personal history of radiation therapy 2002   BREAST CA   S/P percutaneous endoscopic gastrostomy (PEG) tube placement (HCC)    T2DM (type 2 diabetes mellitus) (HCC)    Tracheostomy dependent (HCC) 2012   Ventral hernia    Vocal cord paralysis 2012   Current Outpatient Medications on File Prior to Visit  Medication Sig Dispense Refill   acetaminophen (TYLENOL) 325 MG tablet Place 2 tablets (650 mg total) into feeding tube every 6 (six) hours as needed for fever.     buPROPion (WELLBUTRIN SR) 150 MG 12 hr tablet Take 150 mg by mouth 2 (two) times daily.     Continuous Blood Gluc Sensor (FREESTYLE LIBRE 2 SENSOR) MISC 1 Device by Does not apply route daily. 30 each 1   denosumab (PROLIA) 60 MG/ML SOSY injection Inject 1 mL by subcutaneous route.     diphenhydrAMINE (BENADRYL) 25 mg capsule Take 1 capsule (25 mg total) by mouth every 6 (six) hours as needed for allergies or itching. 30 capsule 0  EPINEPHrine 0.3 mg/0.3 mL IJ SOAJ injection Inject 0.3 mLs (0.3 mg dose) into the muscle once as needed for Anaphylaxis for up to 1 dose.     escitalopram (LEXAPRO) 20 MG tablet TAKE 1 TABLET VIA TUBE DAILY. TAKE ALONG WITH 10 MG DOSE.     estradiol (ESTRACE) 0.1 MG/GM vaginal cream Insert 1 applicatorful twice a week by vaginal route for 30 days.     ezetimibe (ZETIA) 10 MG tablet Take 10 mg by mouth daily.     folic acid (FOLVITE) 1 MG tablet Take 1 tablet by mouth daily.     gabapentin (NEURONTIN) 300 MG capsule Place 300 mg into feeding tube every 8 (eight) hours.     HUMALOG KWIKPEN 100 UNIT/ML KwikPen Inject into the skin.     hydrOXYzine (ATARAX) 25 MG  tablet Place 25 mg into feeding tube 2 (two) times daily.     insulin glargine (LANTUS) 100 UNIT/ML Solostar Pen Inject 10 Units into the skin daily. 15 mL 11   Insulin Pen Needle (PEN NEEDLES 3/16") 31G X 5 MM MISC 1 Container by Does not apply route daily. 30 each 1   loratadine (CLARITIN) 10 MG tablet Place 1 tablet (10 mg total) into feeding tube daily.     lovastatin (MEVACOR) 20 MG tablet Take 20 mg by mouth at bedtime.     meclizine (ANTIVERT) 12.5 MG tablet Take 12.5 mg by mouth 3 (three) times daily as needed for dizziness.     Melatonin 5 MG CAPS Take by mouth.     metoprolol tartrate (LOPRESSOR) 25 MG tablet Place 1 tablet (25 mg total) into feeding tube daily. (Patient taking differently: Place 12.5 mg into feeding tube 2 (two) times daily.)     Multiple Vitamin (MULTIVITAMIN) LIQD Place 15 mLs into feeding tube daily.     nitrofurantoin, macrocrystal-monohydrate, (MACROBID) 100 MG capsule Take 1 capsule (100 mg total) by mouth daily. 30 capsule 11   Nutritional Supplements (FEEDING SUPPLEMENT, KATE FARMS STANDARD 1.4,) LIQD liquid Place 1,440 mLs into feeding tube daily.     pantoprazole (PROTONIX) 40 MG tablet Take 40 mg by mouth daily.     Propylene Glycol (SYSTANE BALANCE) 0.6 % SOLN Place 1 drop into both eyes 2 (two) times daily.     rOPINIRole (REQUIP) 0.5 MG tablet Take 0.5 mg by mouth at bedtime.     sodium chloride HYPERTONIC 3 % nebulizer solution Take 4 mLs by nebulization 2 (two) times daily. 750 mL 12   TOUJEO SOLOSTAR 300 UNIT/ML Solostar Pen Inject into the skin.     Vibegron (GEMTESA) 75 MG TABS Take 1 tablet (75 mg total) by mouth daily. 28 tablet 0   Water For Irrigation, Sterile (FREE WATER) SOLN Place 50 mLs into feeding tube every 4 (four) hours.     Zinc Oxide (BAZA PROTECT MOISTURE BARRIER) 12 % CREA Apply 1 application twice a day by topical route.     No current facility-administered medications on file prior to visit.       Observations/Objective: CT  chest Jul 13, 2022 negative for PE, elevated right hemidiaphragm, stable scattered pulmonary nodules.  01/03/2023 appears well with no acute distress.  Does not appear to have increased work of breathing.  Trach appears midline.  Assessment and Plan: Chronic hypoxic and hypercarbic respiratory failure in the setting of VCD , History of MRSA pneumonia .  PEG tube.    Chronic hypoxic and hypercarbic respiratory failure with chronic trach.  In the setting of  vocal cord dysfunction.  Followed by ENT for ongoing management.  May use DuoNeb as needed.  Continue on nocturnal vent at bedtime and with naps. Continue with daily trach care. Continue PEG tube bolus feeding. Aspiration precautions discussed. Check Download   Plan  . Patient Instructions  Recommendations: - Continue ventilator every night and with naps.  - Continue to use hypertonic saline bullets 2-3 times a day as needed for congestion - Use ipratropium-albuterol (DuoNeb) every 4-6 hours as needed for breakthrough shortness of breath or wheezing   Follow-up: - 6 months with Dr. Belia Heman in [facility]   Follow Up Instructions:    I discussed the assessment and treatment plan with the patient. The patient was provided an opportunity to ask questions and all were answered. The patient agreed with the plan and demonstrated an understanding of the instructions.   The patient was advised to call back or seek an in-person evaluation if the symptoms worsen or if the condition fails to improve as anticipated.  I provided  31 minutes of non-face-to-face time during this encounter.   Rubye Oaks, NP

## 2023-01-03 NOTE — Patient Instructions (Signed)
Recommendations: - Continue ventilator every night and with naps.  - Continue to use hypertonic saline bullets 2-3 times a day as needed for congestion - Use ipratropium-albuterol (DuoNeb) every 4-6 hours as needed for breakthrough shortness of breath or wheezing   Follow-up: - 6 months with Dr. Belia Heman in [place]

## 2023-02-19 IMAGING — DX DG ABDOMEN 1V
1 series · 1 of 1 positions shown · non-contrast
Comparison: 10/21/2020

CLINICAL DATA: Peg tube placement.

EXAM:
ABDOMEN - 1 VIEW

[abdomen supine]
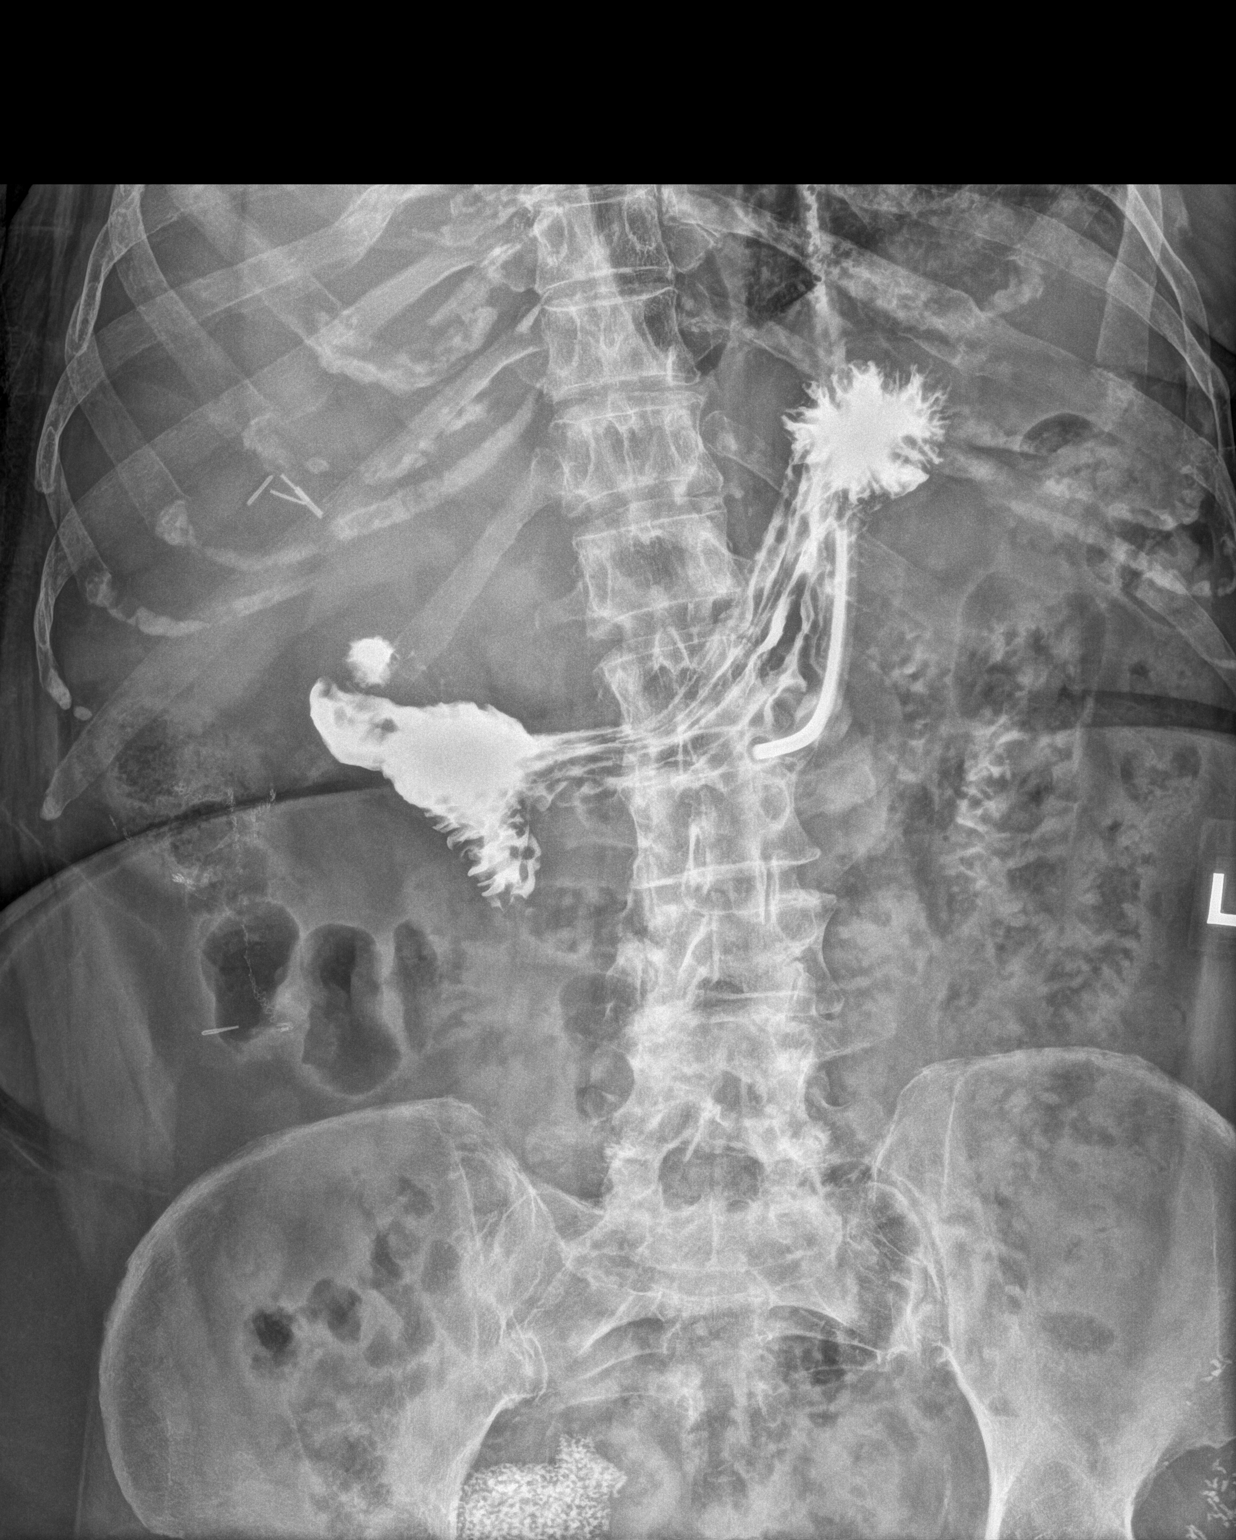

[1 of 1 positions shown; findings below may reference images not displayed]

FINDINGS: Contrast has been injected through the percutaneously placed
gastrostomy tube. Contrast fills the stomach extending into the
first second portions of the duodenum. There is no contrast
extravasation.

Bowel anastomosis staples noted along the right mid abdomen,
unchanged.
IMPRESSION: 1. Well-positioned gastrostomy tube.

## 2023-03-17 IMAGING — DX DG CHEST 1V PORT
1 series · 1 of 1 positions shown · non-contrast
Comparison: Multiple priors, most recent 02/20/2021.

CLINICAL DATA: Dyspnea [id number] ([id number])

EXAM:
PORTABLE CHEST 1 VIEW

[chest ap]
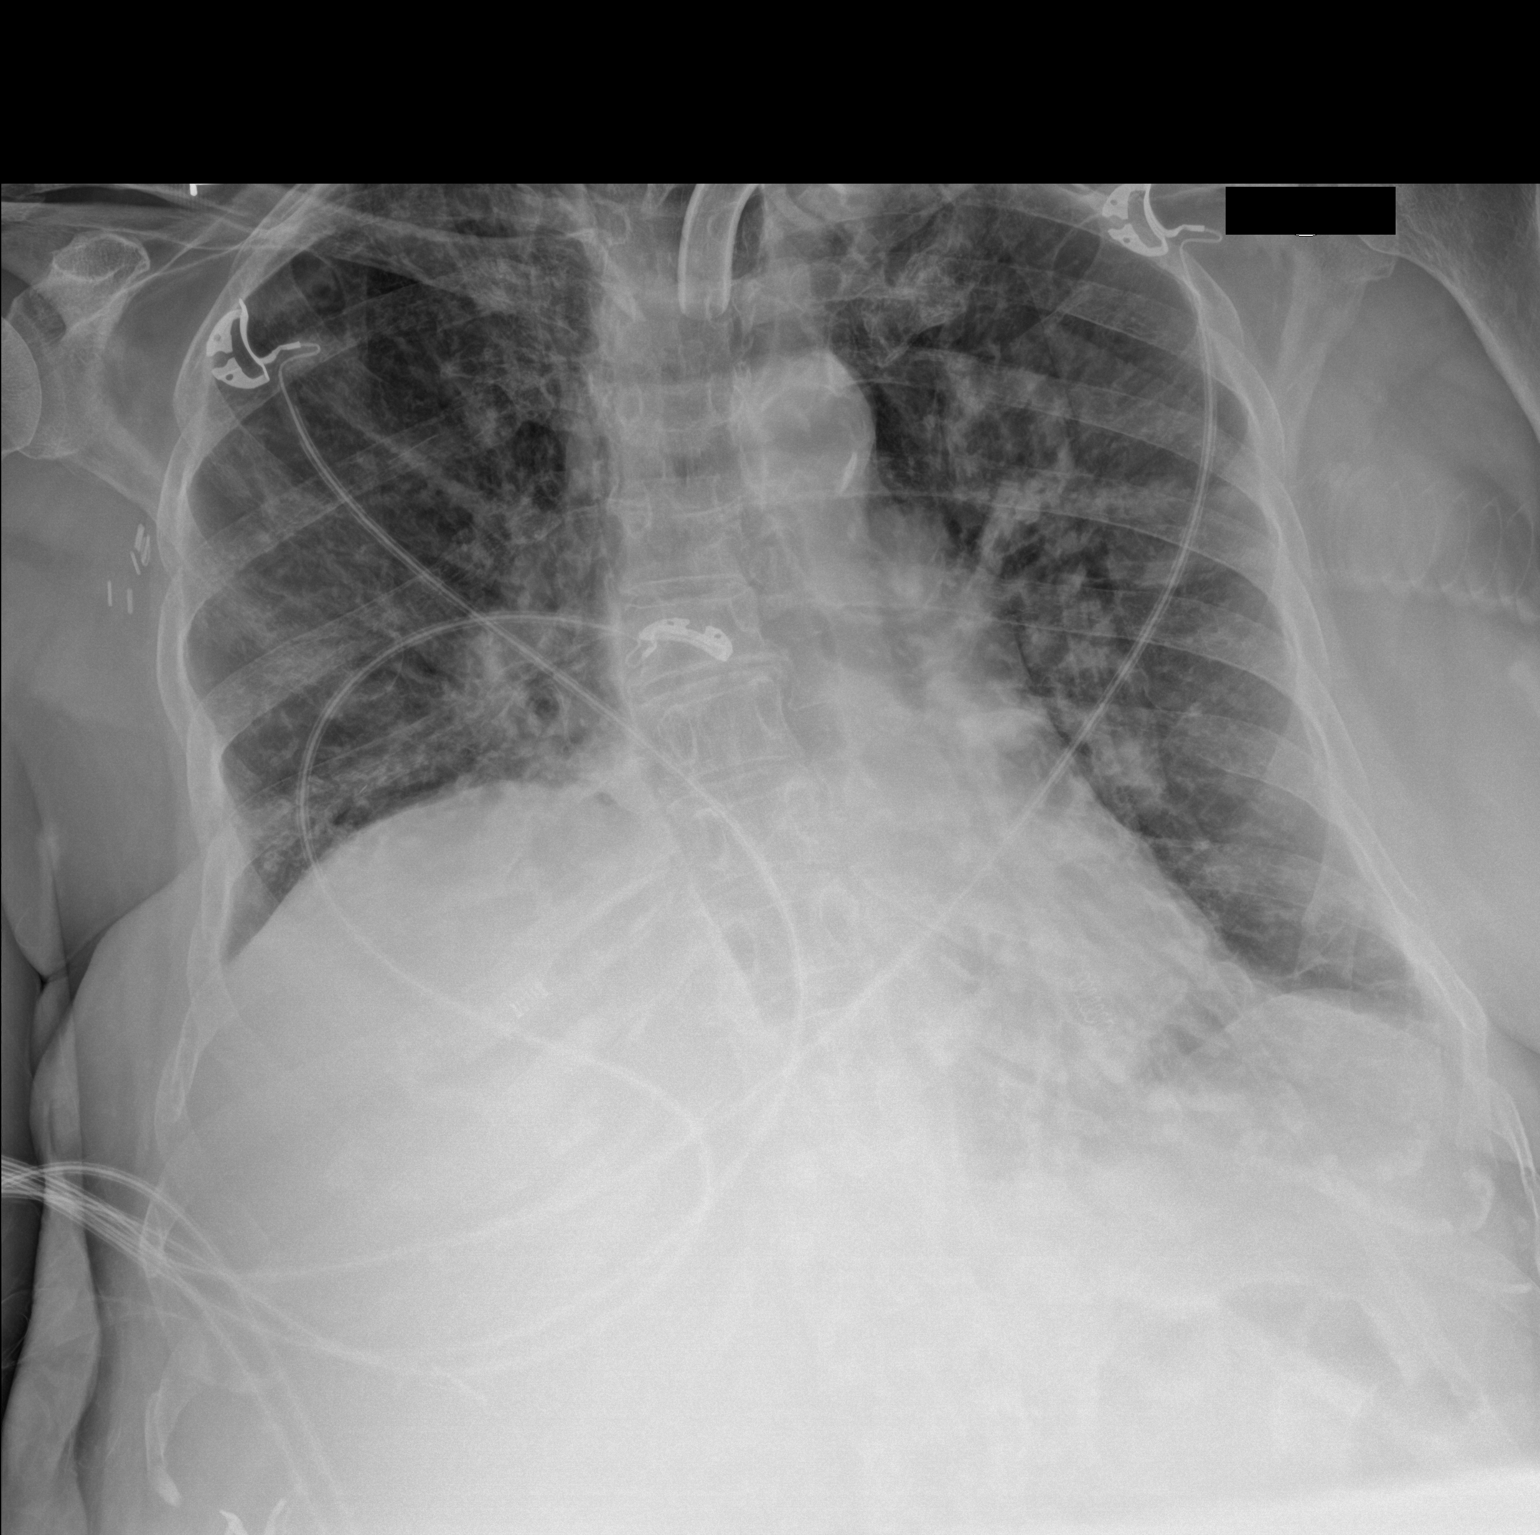

[1 of 1 positions shown; findings below may reference images not displayed]

FINDINGS: Chronic coarsening of the interstitial markings with streaky areas
of probable atelectasis. No confluent consolidation. No definite
pleural effusions or visible pneumothorax. Biapical
pleuroparenchymal scarring. Tracheostomy tube projects midline over
the tracheal air column at the level of the clavicular heads.
Cardiomediastinal silhouette is within normal limits. Calcific
atherosclerosis of the aorta. Polyarticular degenerative change.
IMPRESSION: Chronic coarsening of the interstitial markings with streaky areas
of probable atelectasis. No confluent consolidation.

## 2023-03-18 IMAGING — CT CT HEAD W/O CM
5 of 9 series · 16 of 47 positions shown, 17 images · non-contrast
Comparison: Head CT 10/29/2020

CLINICAL DATA: Mental status change.

EXAM:
CT HEAD WITHOUT CONTRAST
TECHNIQUE: Contiguous axial images were obtained from the base of the skull
through the vertex without intravenous contrast.

[Series 4: head wo · axial · 0.44mm/px · z∈[+499,+554]mm · 2 of 34 slices shown]
[im 12/34  brain]
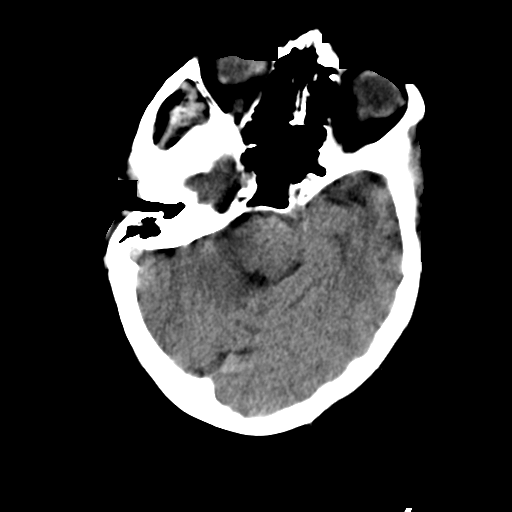
[im 23/34  brain]
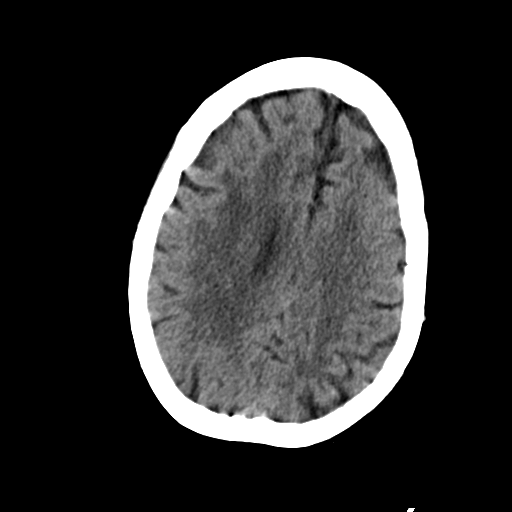

[Series 7: sagittal soft tissue · sagittal · 0.32mm/px · 3 of 54 slices shown]
[im 35/54  brain]
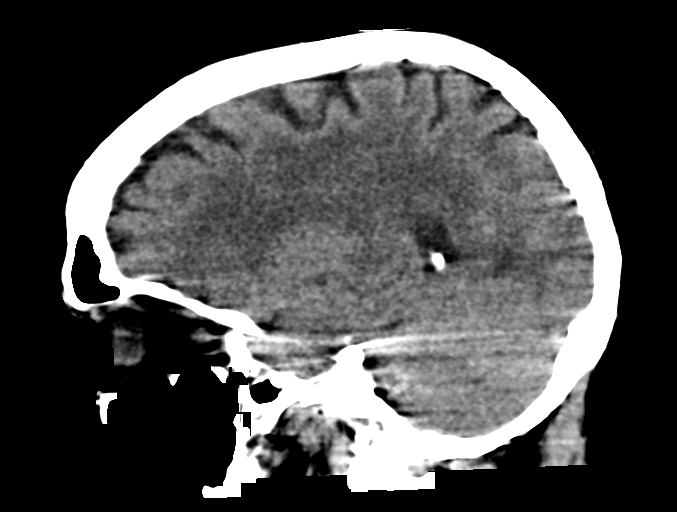
[im 41/54  brain]
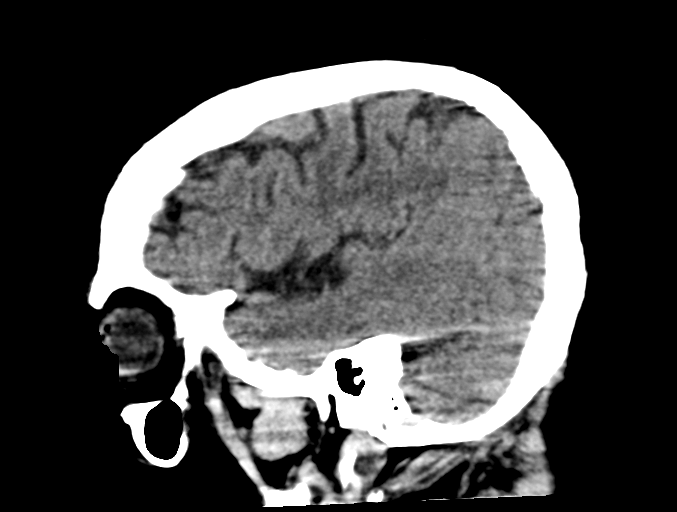
[im 47/54  brain]
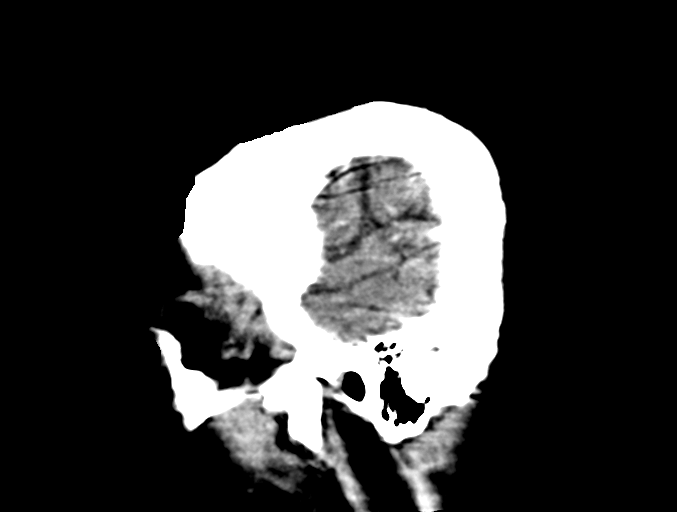

[Series 8: head bone · axial · 0.44mm/px · z∈[+464,+526]mm · 4 of 84 slices shown]
[im 11/84  bone]
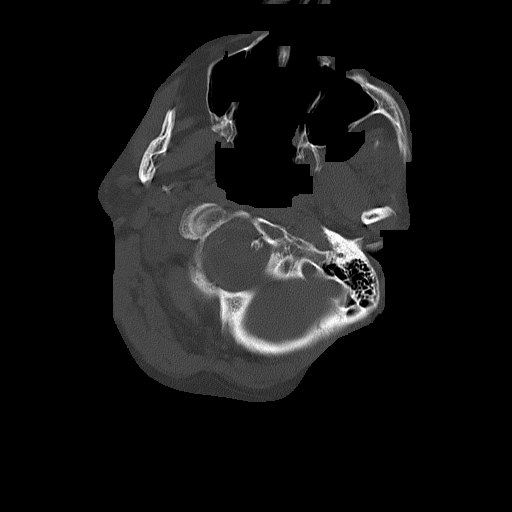
[im 21/84  bone]
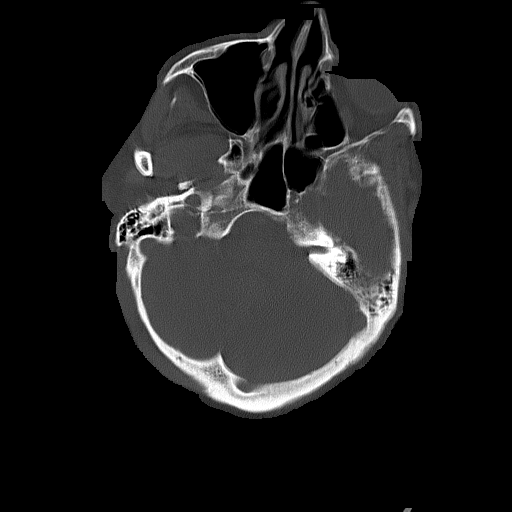
[im 32/84  bone]
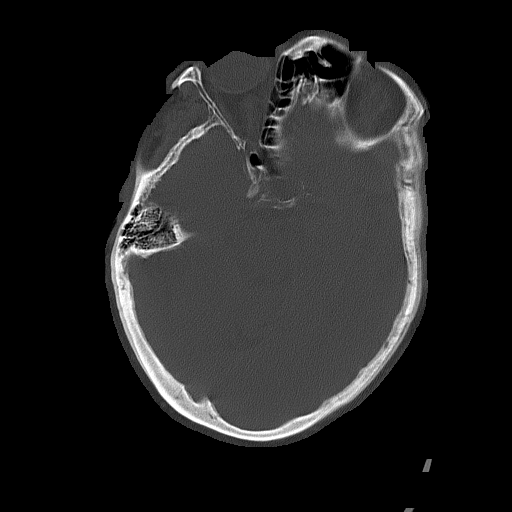
[im 42/84  bone]
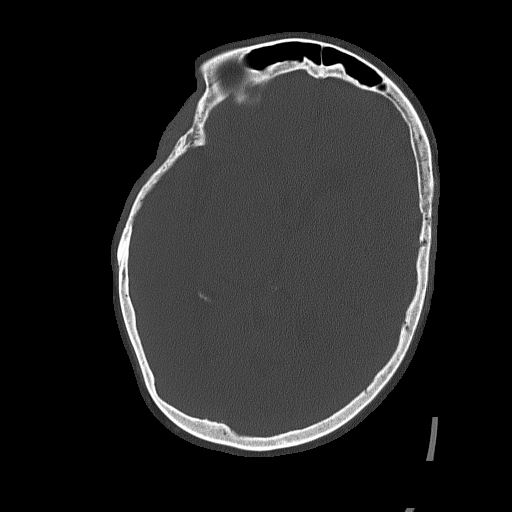

[Series 9: coronal soft tissue · coronal · 0.32mm/px · 3 of 74 slices shown]
[im 9/74  brain]
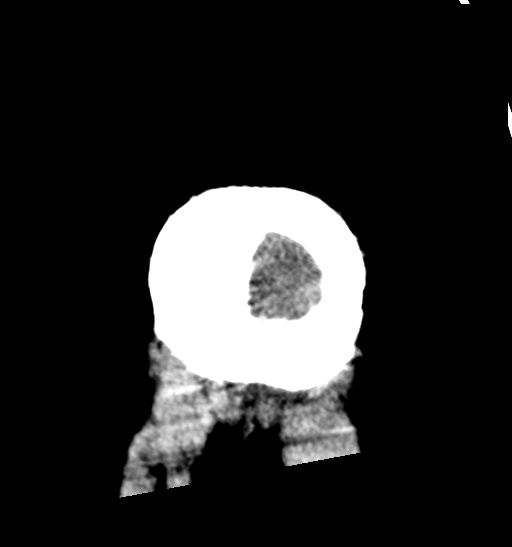
[im 28/74  brain]
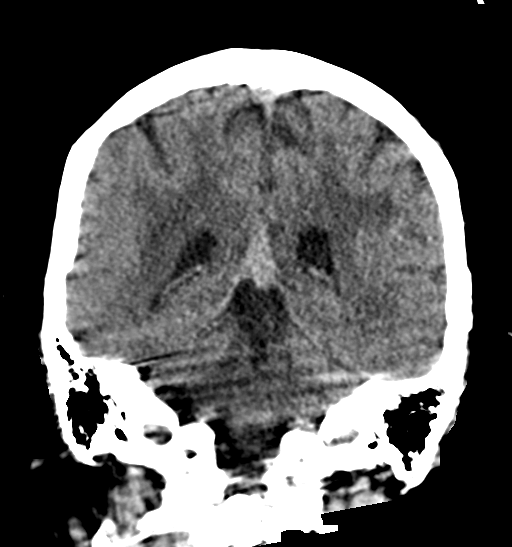
[im 47/74  brain]
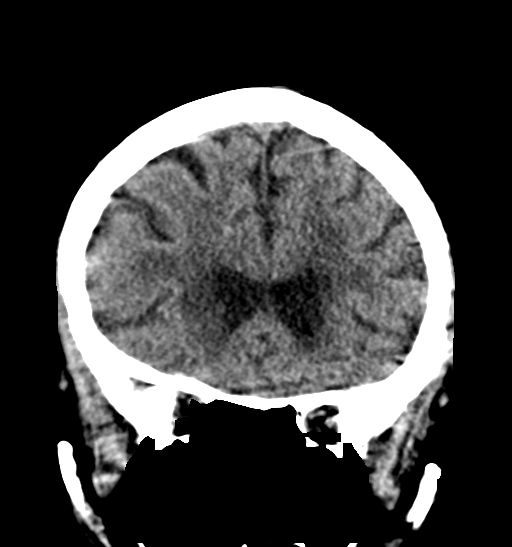

[Series 11: axial soft tissue · axial · 0.32mm/px · z∈[+535,+625]mm · 4 of 59 slices shown, 5 images]
[im 12/59  brain]
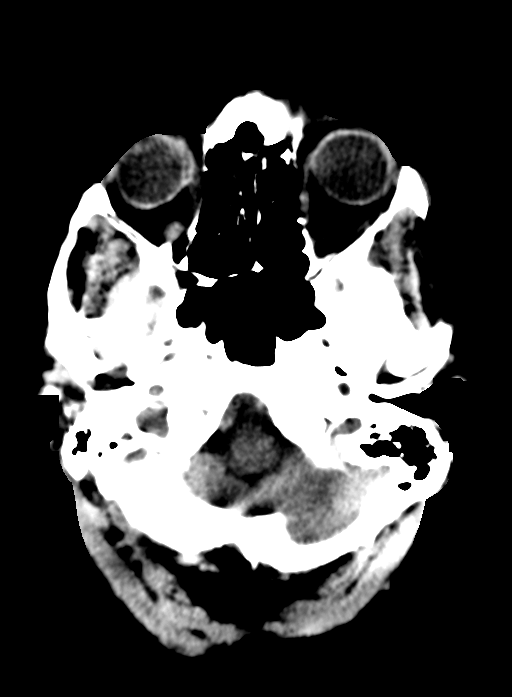
[im 12/59  bone]
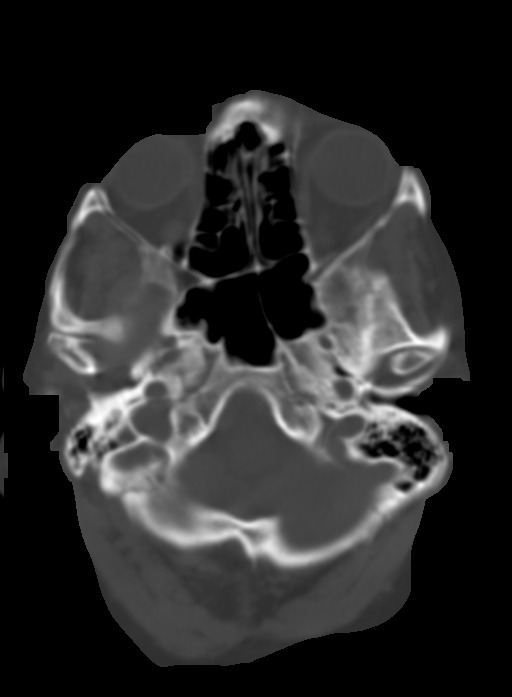
[im 24/59  brain]
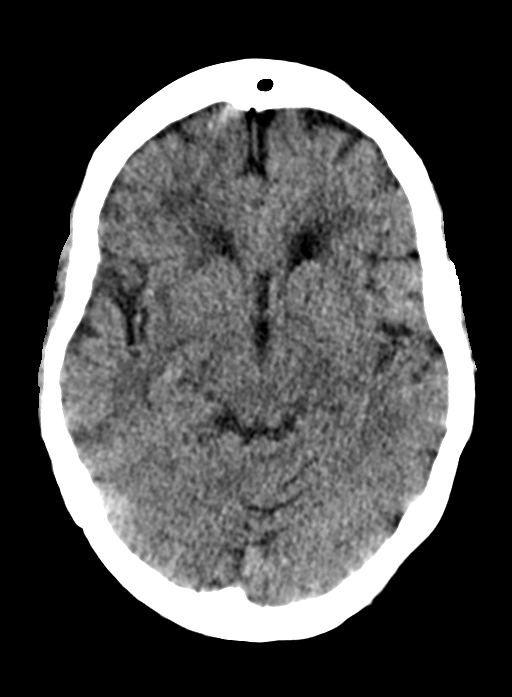
[im 35/59  brain]
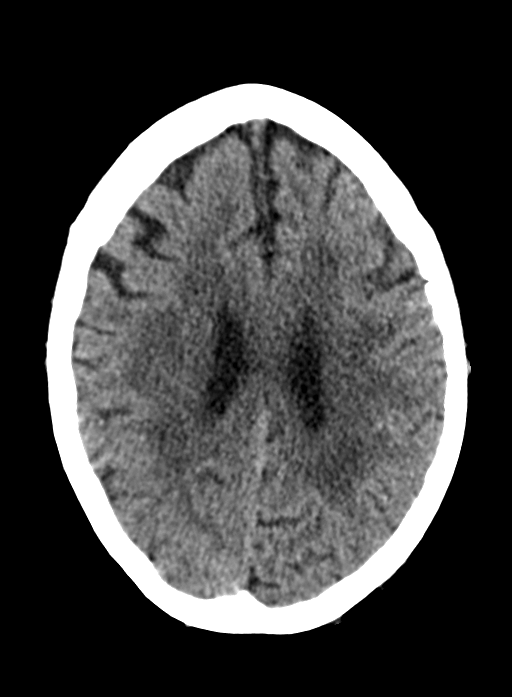
[im 47/59  brain]
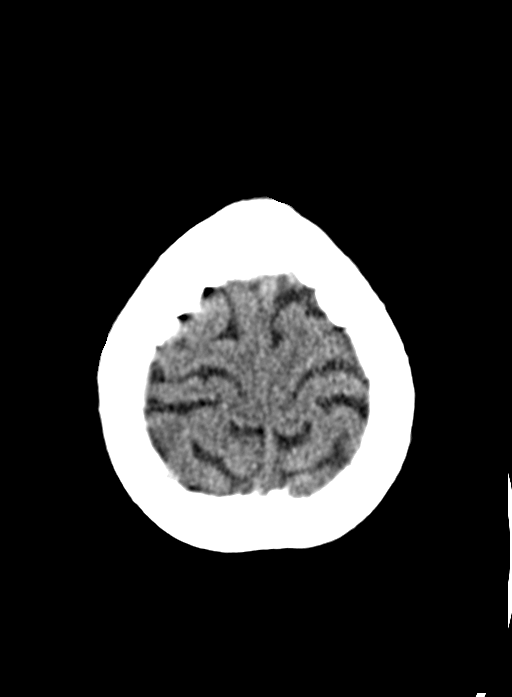

[16 of 47 positions shown; findings below may reference images not displayed]

FINDINGS: Brain: Stable age related cerebral atrophy, ventriculomegaly and
advanced periventricular white matter disease. No extra-axial fluid
collections are identified. No CT findings for acute hemispheric
infarction or intracranial hemorrhage. No mass lesions. The
brainstem and cerebellum are normal.

Vascular: Stable vascular calcifications. No aneurysm or hyperdense
vessels.

Skull: No acute skull fracture.

Sinuses/Orbits: Paranasal sinuses and mastoid air cells are clear.
The globes are intact.

Other: No scalp lesions or scalp hematoma.
IMPRESSION: 1. Stable age related cerebral atrophy, ventriculomegaly and
advanced periventricular white matter disease.
2. No acute intracranial findings or mass lesions.

## 2023-03-18 IMAGING — DX DG CHEST 1V PORT
1 series · 1 of 1 positions shown · non-contrast
Comparison: Chest x-ray from yesterday.

CLINICAL DATA: Sepsis.  Shortness of breath.

EXAM:
PORTABLE CHEST 1 VIEW

[chest ap]
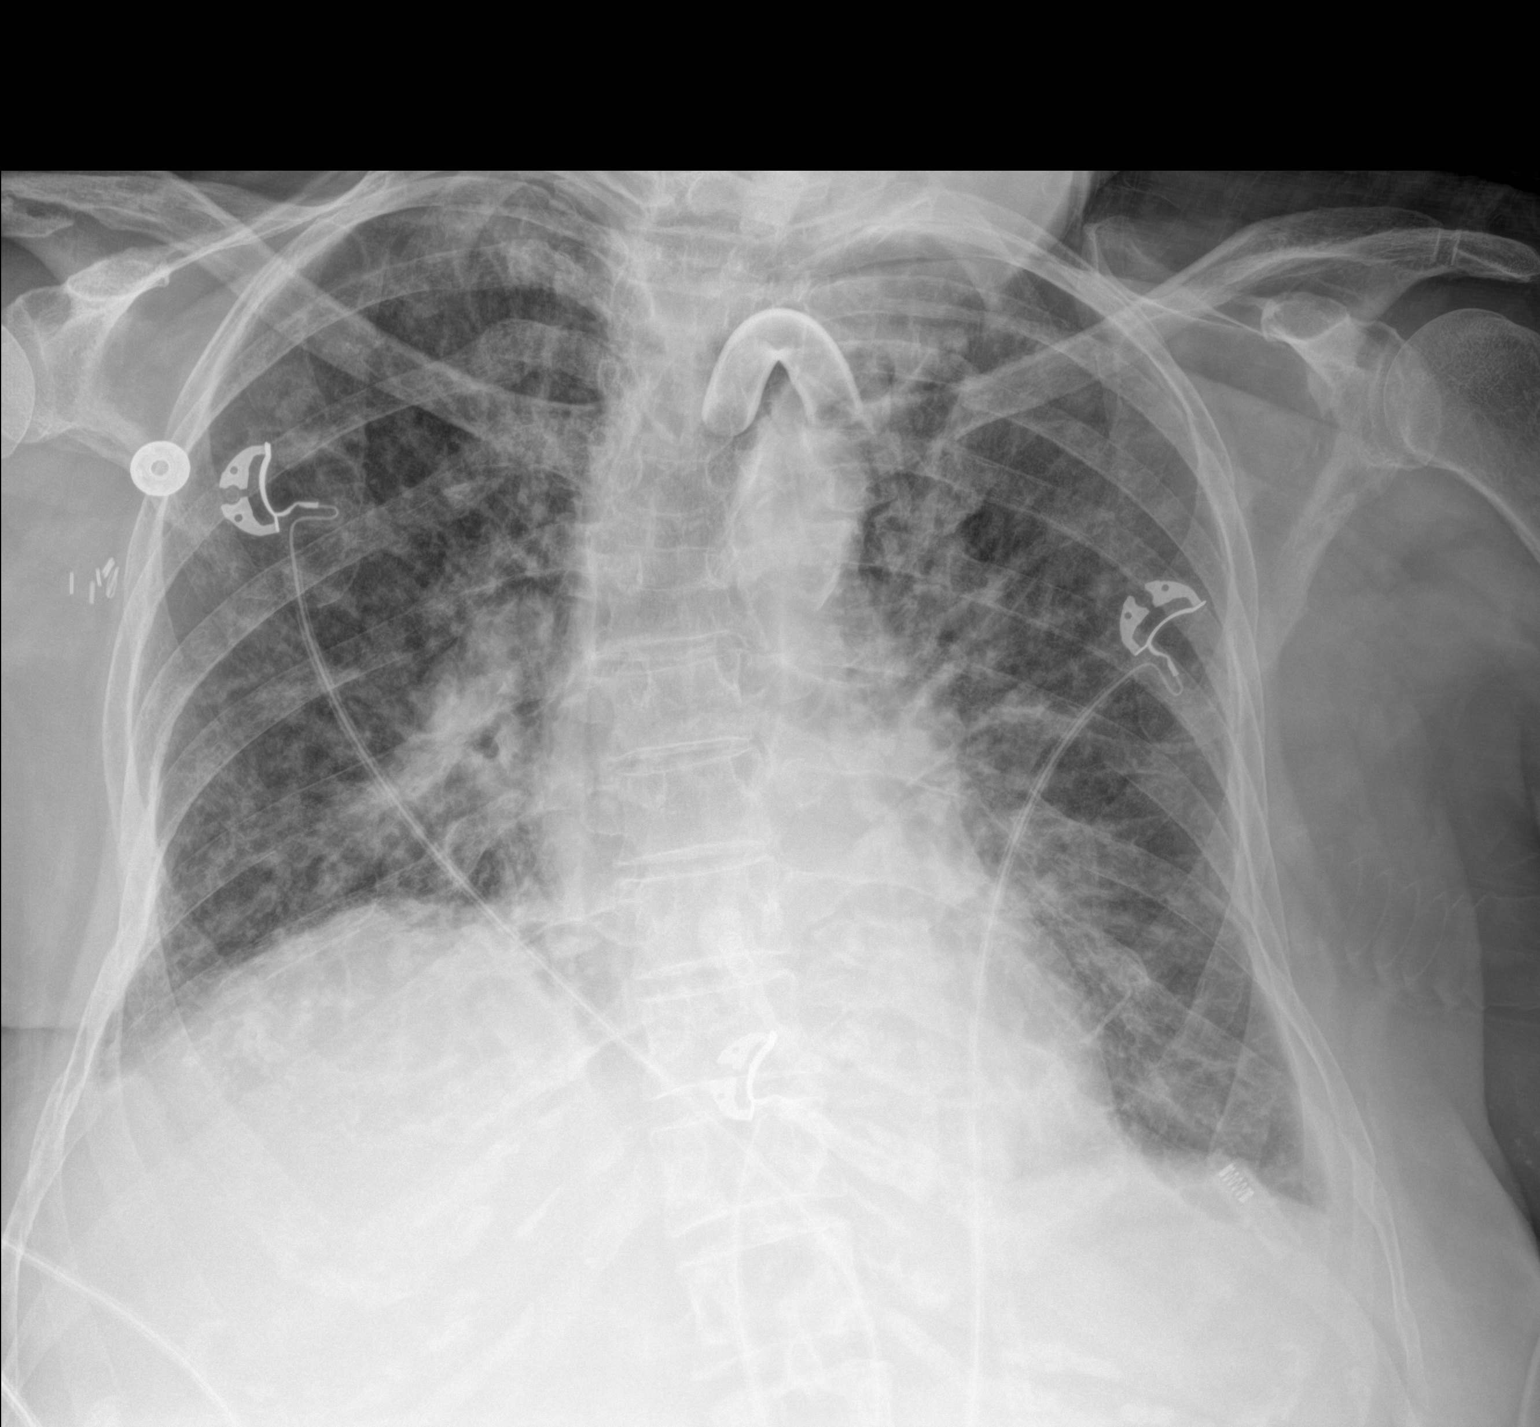

[1 of 1 positions shown; findings below may reference images not displayed]

FINDINGS: Unchanged tracheostomy tube. Normal heart size. Increasing diffuse
interstitial opacities. Suspected new trace bilateral pleural
effusions. No consolidation or pneumothorax. No acute osseous
abnormality.
IMPRESSION: 1. Increasing diffuse interstitial pulmonary edema and probable
trace bilateral pleural effusions.

## 2023-03-18 IMAGING — CT CT CHEST W/O CM
2 of 4 series · 14 of 36 positions shown, 18 images · non-contrast
Comparison: Chest CT 01/17/2020

CLINICAL DATA: Unresponsive.

EXAM:
CT CHEST WITHOUT CONTRAST
TECHNIQUE: Multidetector CT imaging of the chest was performed following the
standard protocol without IV contrast.

[Series 3: thorax · axial · 0.71mm/px · z∈[+79,+343]mm · 13 of 156 slices shown, 17 images]
[im 12/156  mediastinal]
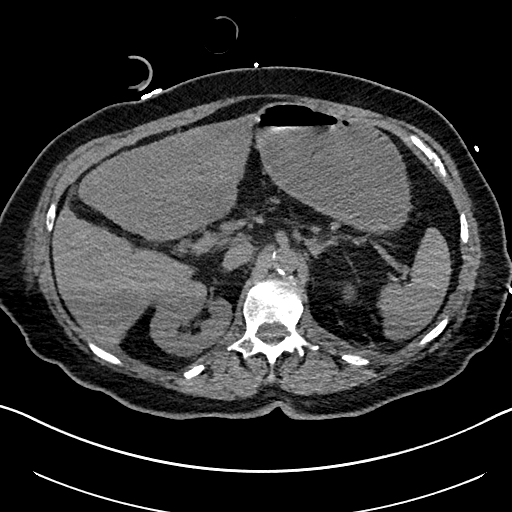
[im 12/156  lung]
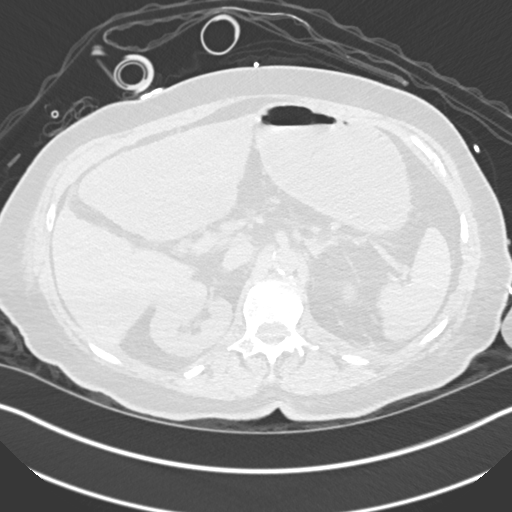
[im 24/156  lung]
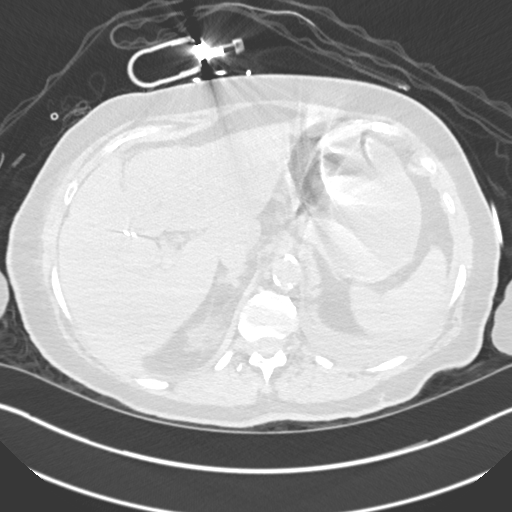
[im 36/156  lung]
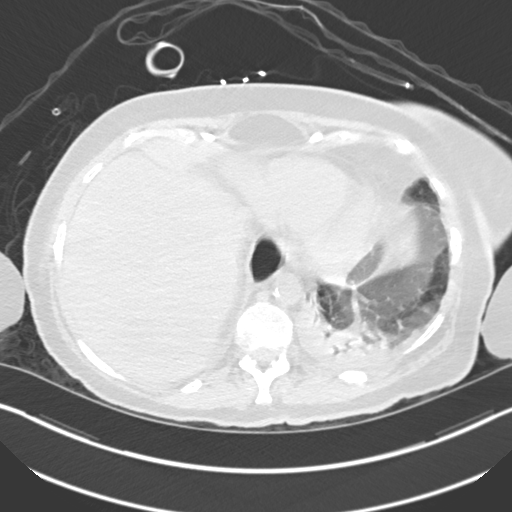
[im 48/156  lung]
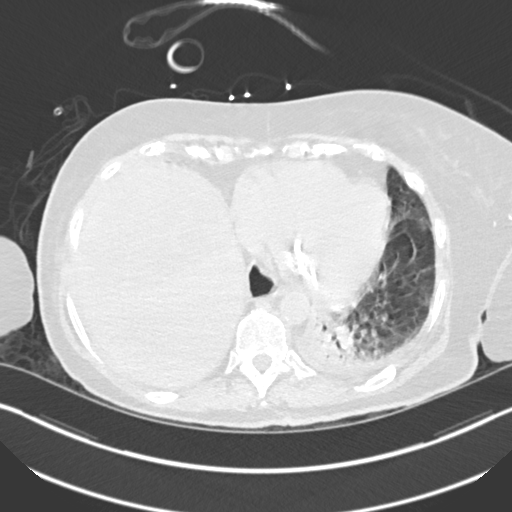
[im 60/156  mediastinal]
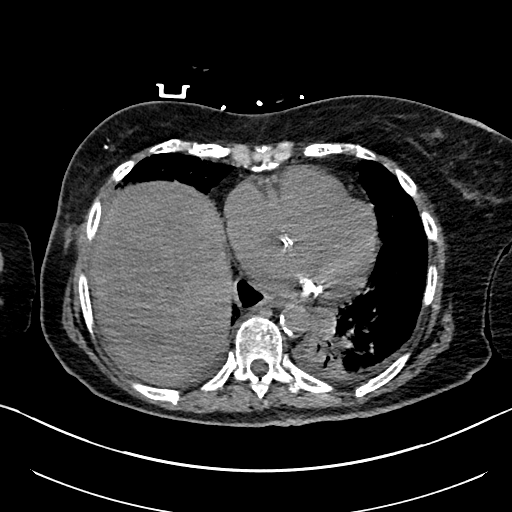
[im 60/156  lung]
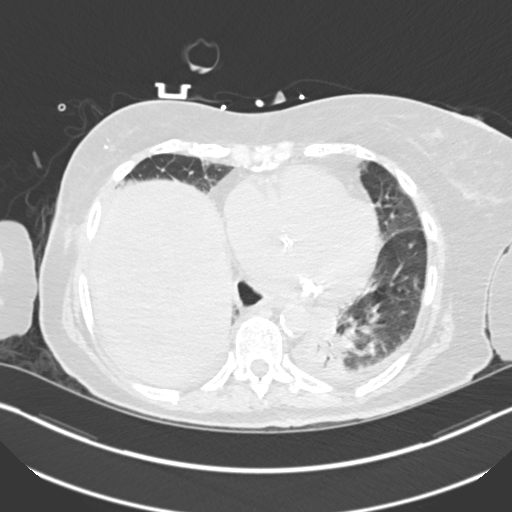
[im 72/156  lung]
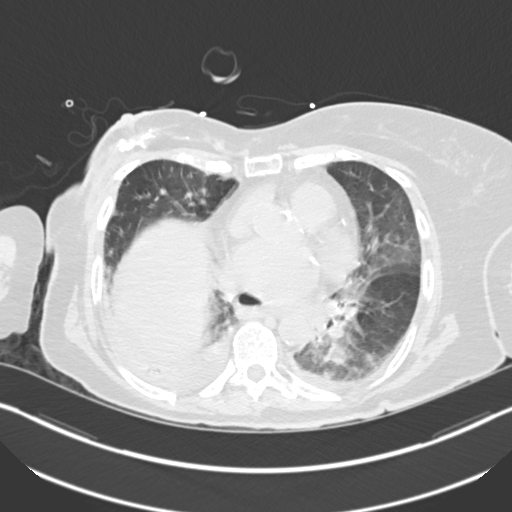
[im 74/156  lung]
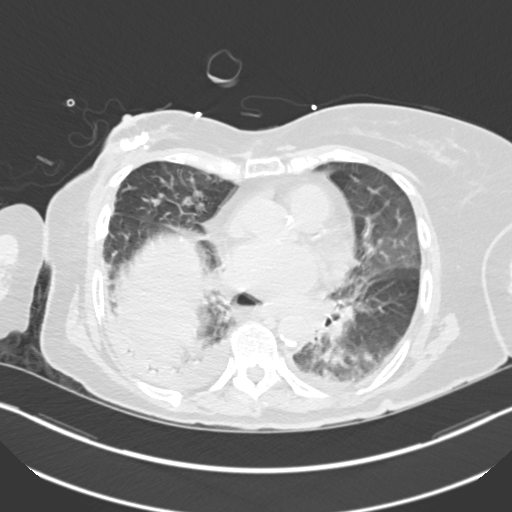
[im 84/156  lung]
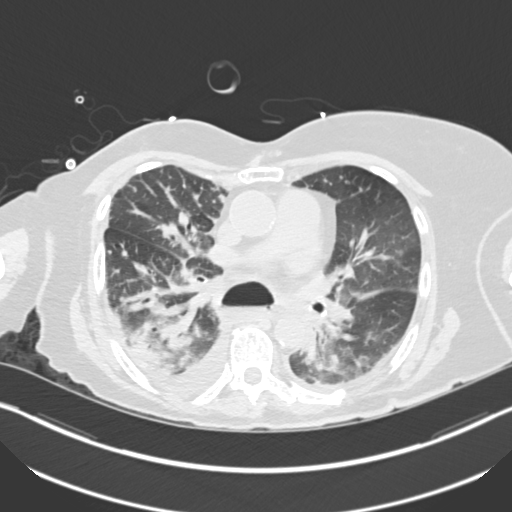
[im 96/156  mediastinal]
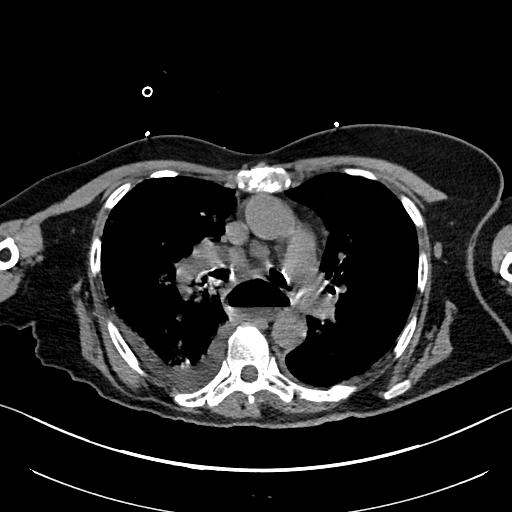
[im 96/156  lung]
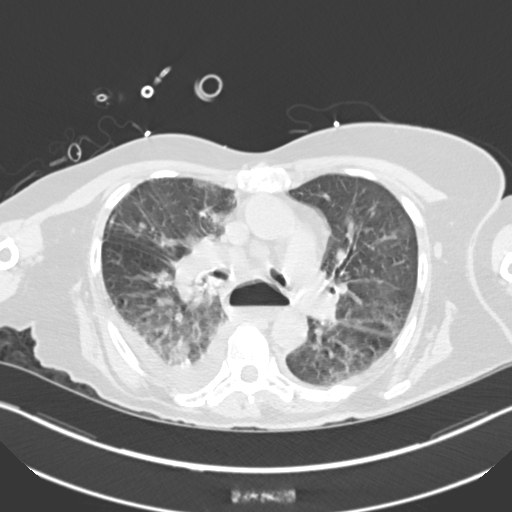
[im 108/156  lung]
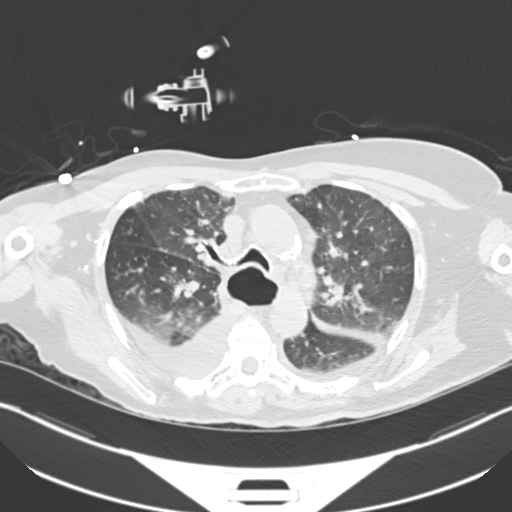
[im 120/156  lung]
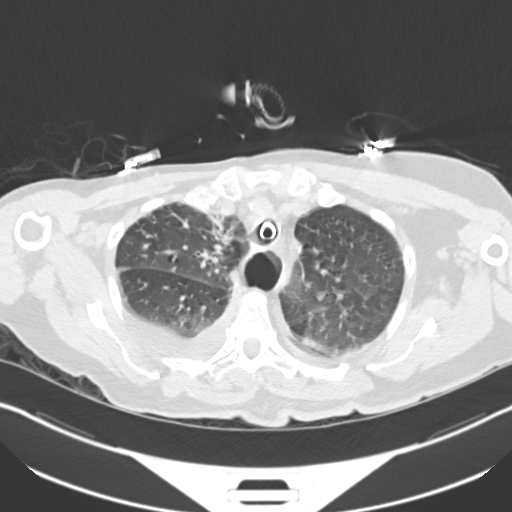
[im 132/156  lung]
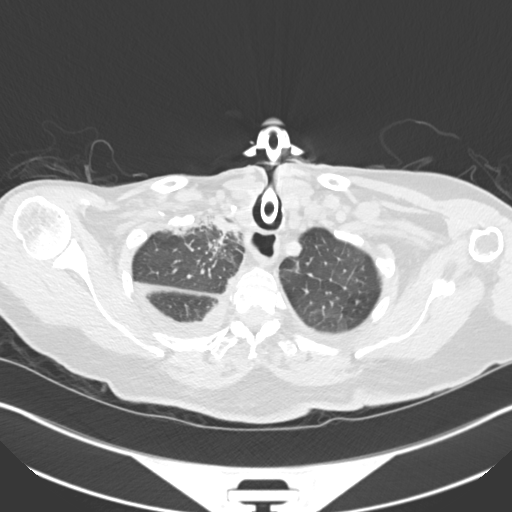
[im 144/156  mediastinal]
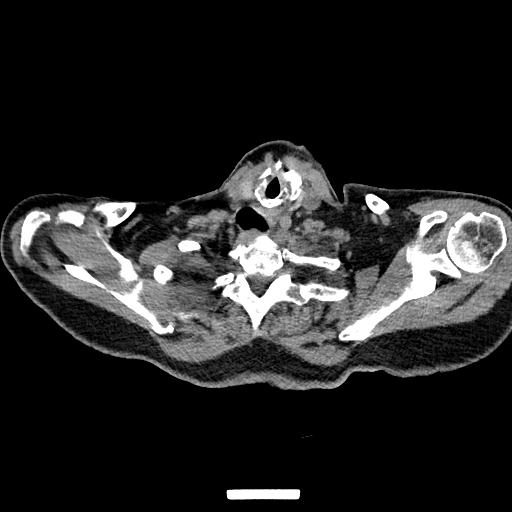
[im 144/156  lung]
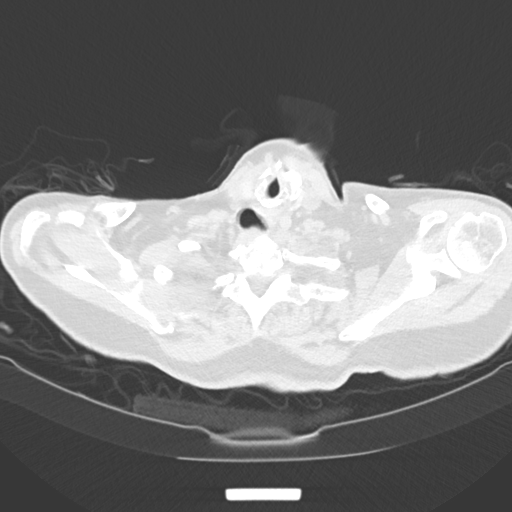

[Series 6: coronal · coronal · 0.63mm/px · 1 of 127 slices shown]
[im 64/127  lung]
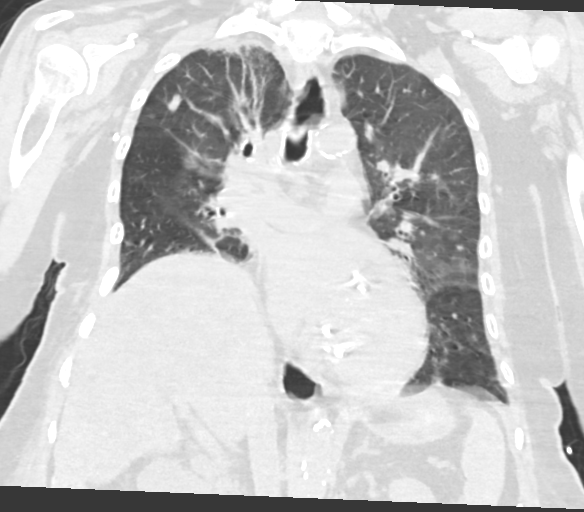

[14 of 36 positions shown; findings below may reference images not displayed]

FINDINGS: Cardiovascular: The heart is normal in size. No pericardial
effusion. Stable tortuosity and calcification of the thoracic aorta.
Stable coronary artery calcifications. Stable mitral valve annular
calcifications.

Mediastinum/Nodes: Borderline mediastinal and hilar lymph nodes
likely inflammatory/reactive given the lung findings. Persistent
markedly dilated esophagus suggesting achalasia. Air-fluid level
noted in the lower half of the esophagus.

Lungs/Pleura: Tracheostomy tube in good position. Moderate-sized
right pleural effusion is noted. There is overlying atelectasis.
Peribronchial thickening and bilateral lower lobe airspace
consolidation likely pneumonia. Aspiration would certainly be a
consideration given the appearance of the esophagus. No obvious
pulmonary mass. Stable appearing right upper lobe pulmonary nodule
measuring 5 mm on image number 35/4. Stable to slightly progressive
right apical scarring changes.

Upper Abdomen: No significant upper abdominal findings. Vascular
calcifications are noted.

Musculoskeletal: Chronic surgical and probable radiation changes
involving the right breast with dense subareolar calcifications.
Bone lesions most consistent with treated metastatic disease. There
also multiple healed rib fractures. No new bone lesions are
identified.
IMPRESSION: 1. Moderate-sized right pleural effusion with overlying atelectasis.
2. Peribronchial thickening and bilateral lower lobe airspace
consolidation likely pneumonia. Aspiration would certainly be a
consideration given the appearance of the esophagus.
3. Stable markedly dilated esophagus suggesting achalasia.
4. Stable scattered bone lesions and healed rib fractures.
5. Aortic atherosclerosis.

Aortic Atherosclerosis (TG02O-TTT.T).

## 2023-03-20 IMAGING — DX DG CHEST 1V PORT
1 series · 1 of 1 positions shown · non-contrast
Comparison: One view chest x-ray 02/22/2021. CT chest without
contrast 02/22/2021.

CLINICAL DATA: Respiratory distress.

EXAM:
PORTABLE CHEST 1 VIEW

[chest ap]
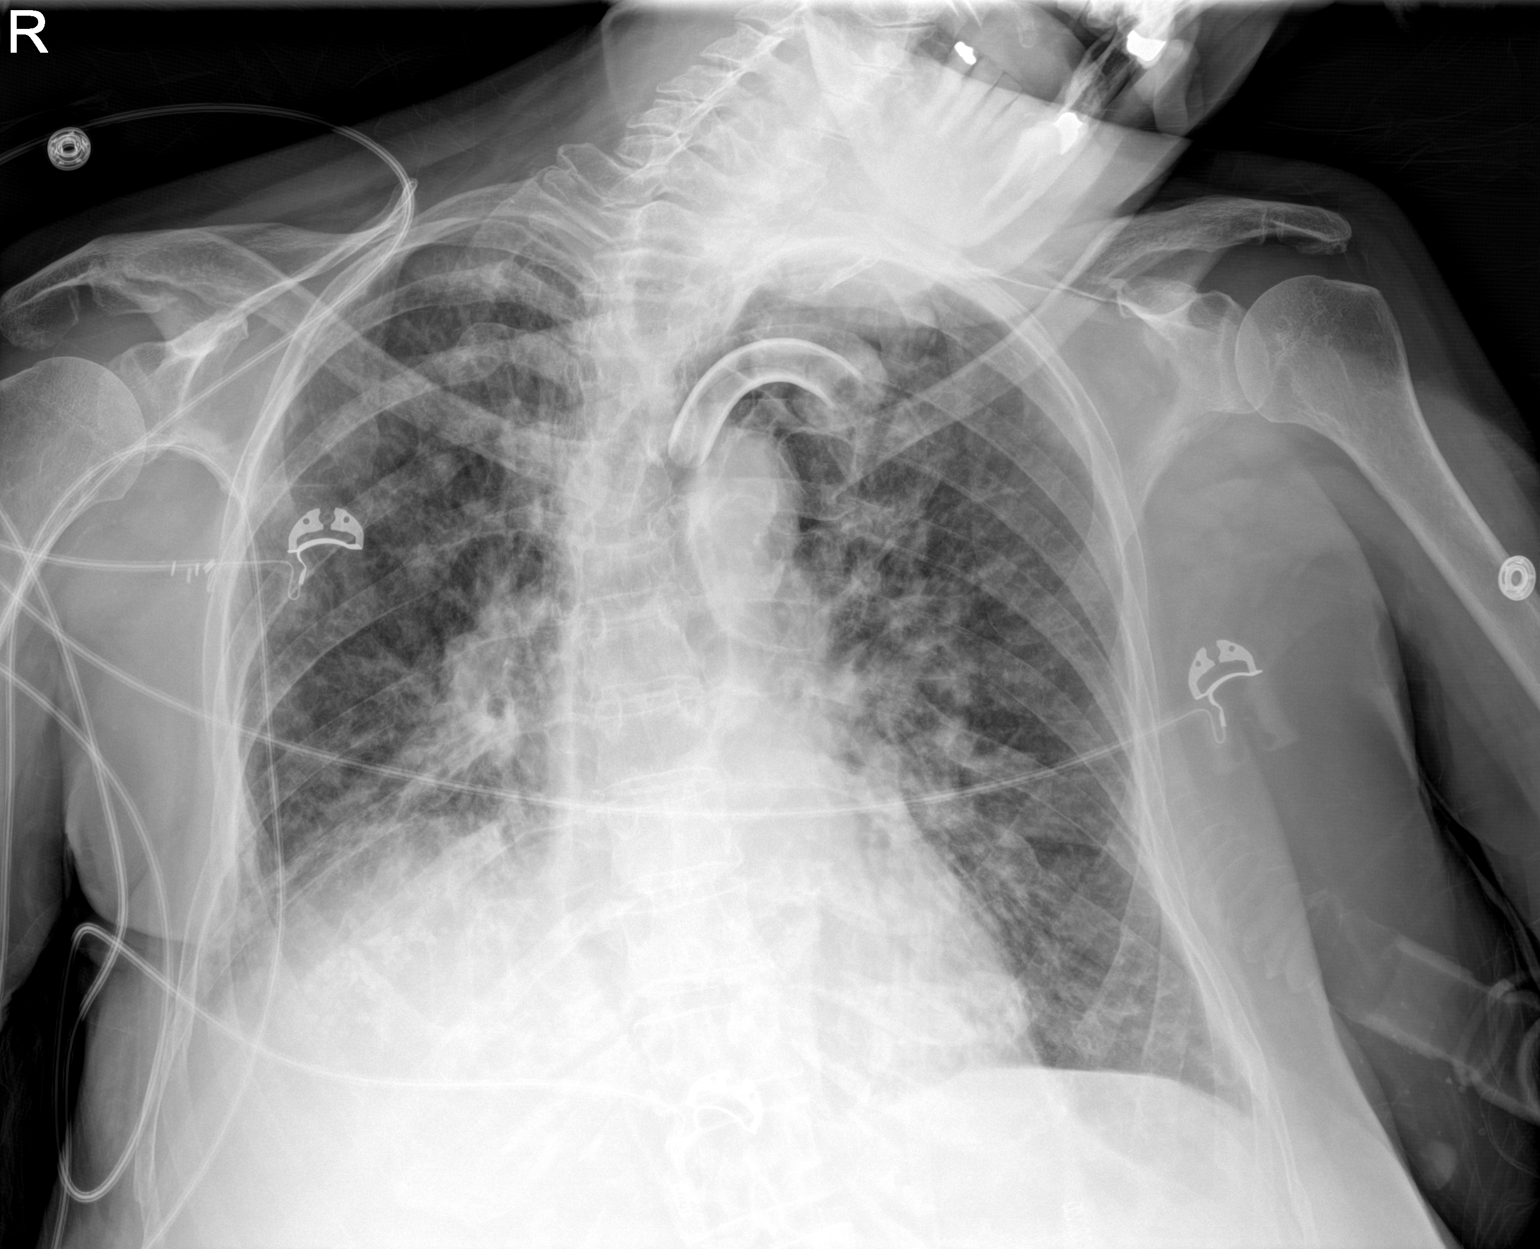

[1 of 1 positions shown; findings below may reference images not displayed]

FINDINGS: Tracheostomy tube is in place. Heart size is normal. Progressive
right pleural effusion and right greater than left airspace disease
noted.
IMPRESSION: Progressive right pleural effusion and right greater than left
airspace disease.

## 2023-03-22 IMAGING — DX DG CHEST 1V PORT
1 series · 1 of 1 positions shown · non-contrast
Comparison: 02/24/2021 and older exams.

CLINICAL DATA: Pt admitted to hospital on [DATE] for generalized
malaise fever and chills also having some shortness of breath,
hypoxia at 80% on room air. Pt only responsive to painful stimuli at
time of imaging. Hx of CHF, chronic respiratory failure, right rib
fractures.

EXAM:
PORTABLE CHEST 1 VIEW

[chest ap]
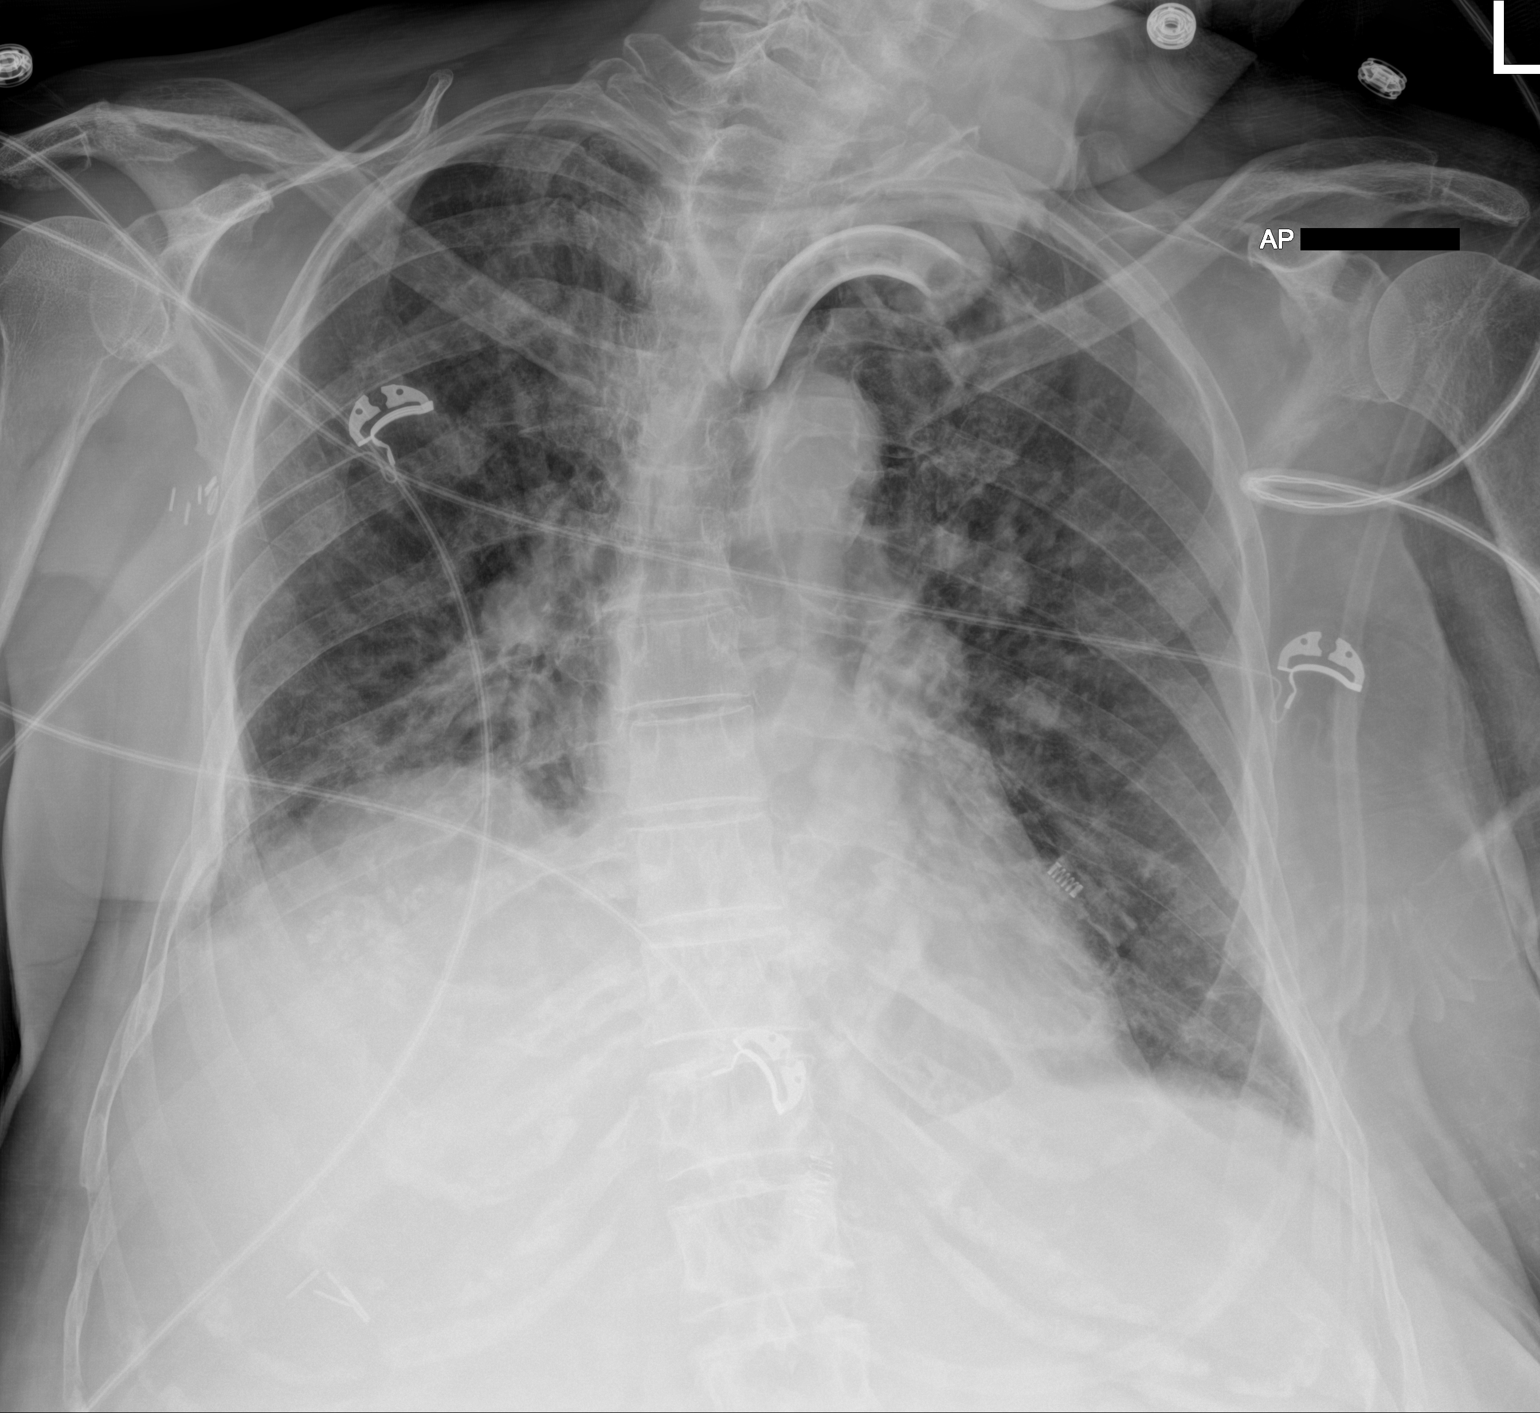

[1 of 1 positions shown; findings below may reference images not displayed]

FINDINGS: Patchy areas of hazy airspace opacity, right greater than left, are
without convincing change from the most recent prior exam. No new
lung abnormalities.

Stable well-positioned tracheostomy tube.

No pneumothorax.  Probable small right pleural effusion.
IMPRESSION: 1. No significant change from the most recent prior exam.

## 2023-03-22 IMAGING — CT CT ABD-PELV W/ CM
2 of 5 series · 16 of 46 positions shown, 18 images · IV contrast (APPLIED)
Comparison: February 20, 2021.

CLINICAL DATA: Possible abscess.

EXAM:
CT ABDOMEN AND PELVIS WITH CONTRAST
TECHNIQUE: Multidetector CT imaging of the abdomen and pelvis was performed
using the standard protocol following bolus administration of
intravenous contrast.
CONTRAST:  100mL OMNIPAQUE IOHEXOL 300 MG/ML  SOLN

[Series 2: routine abd/pel with · axial · 0.96mm/px · z∈[-1293,-828]mm · 13 of 107 slices shown, 15 images]
[im 7/107  soft-tissue]
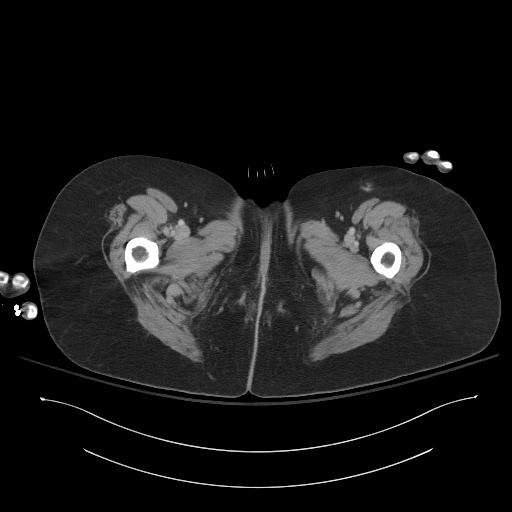
[im 7/107  bone]
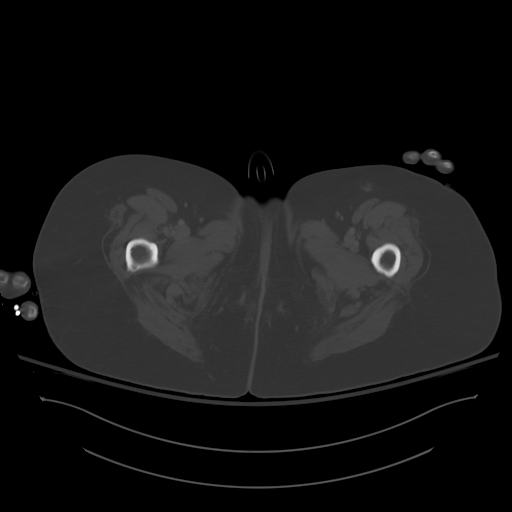
[im 14/107  soft-tissue]
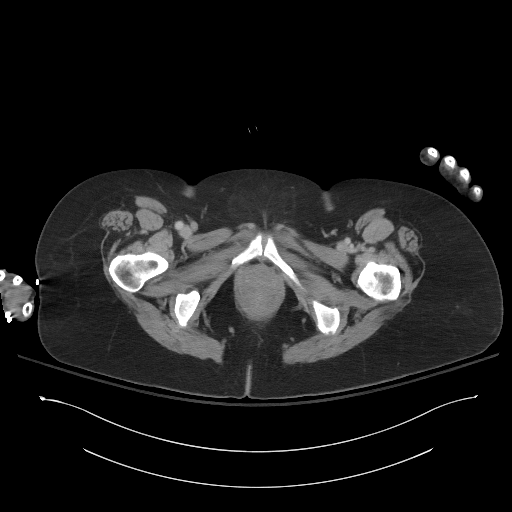
[im 20/107  soft-tissue]
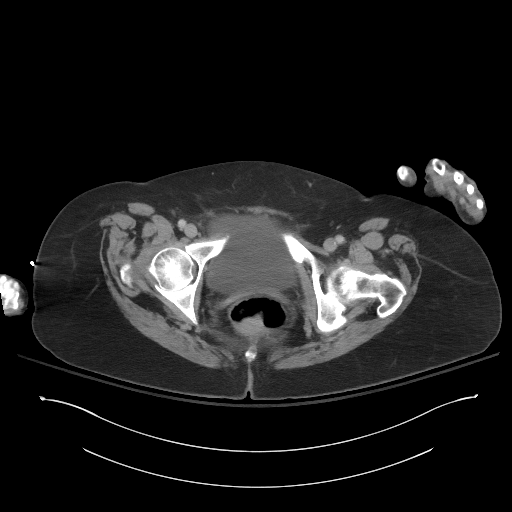
[im 34/107  soft-tissue]
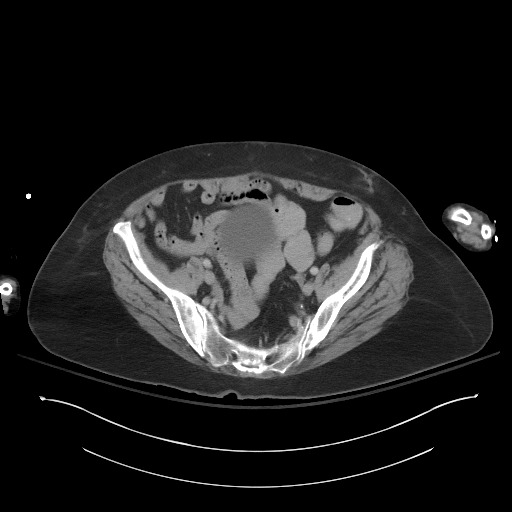
[im 40/107  soft-tissue]
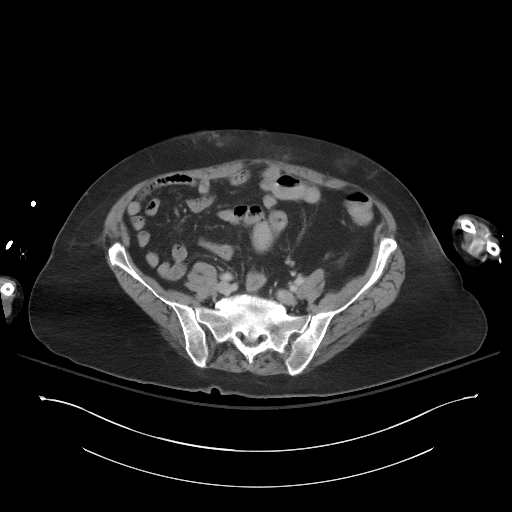
[im 47/107  soft-tissue]
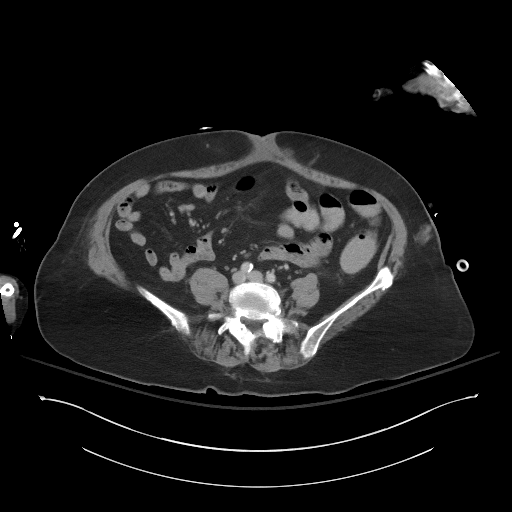
[im 54/107  soft-tissue]
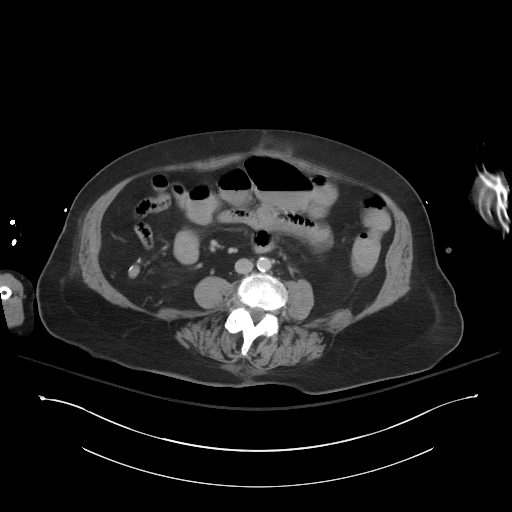
[im 60/107  soft-tissue]
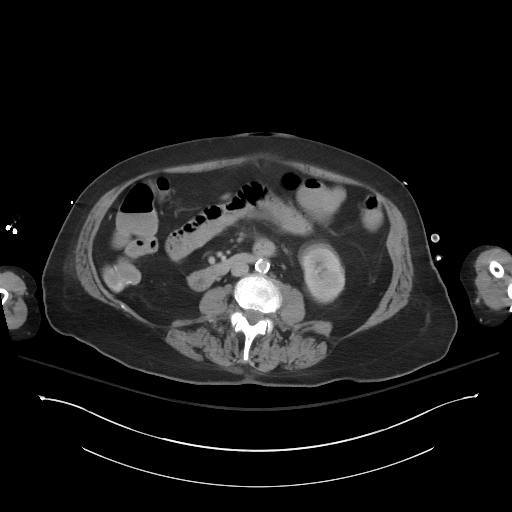
[im 67/107  soft-tissue]
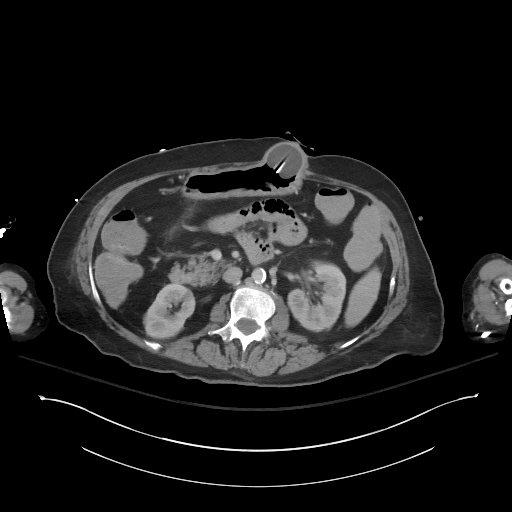
[im 67/107  bone]
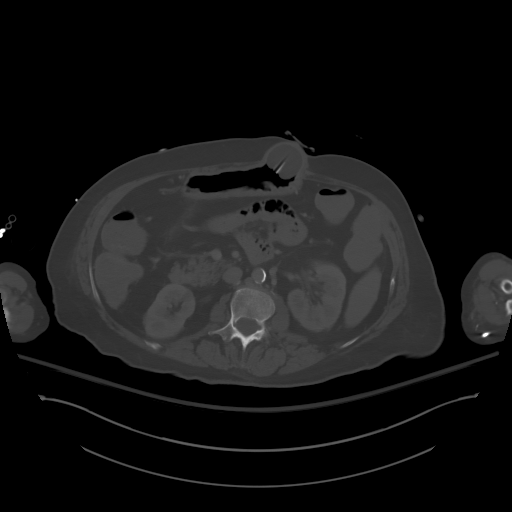
[im 73/107  soft-tissue]
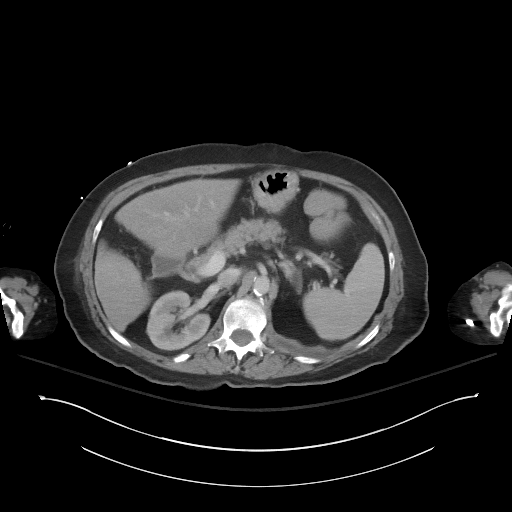
[im 87/107  soft-tissue]
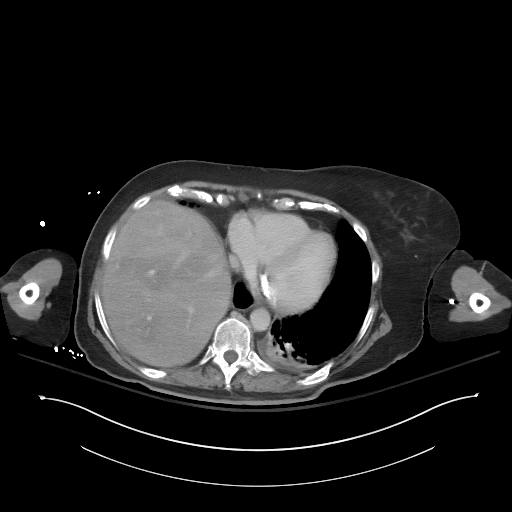
[im 93/107  soft-tissue]
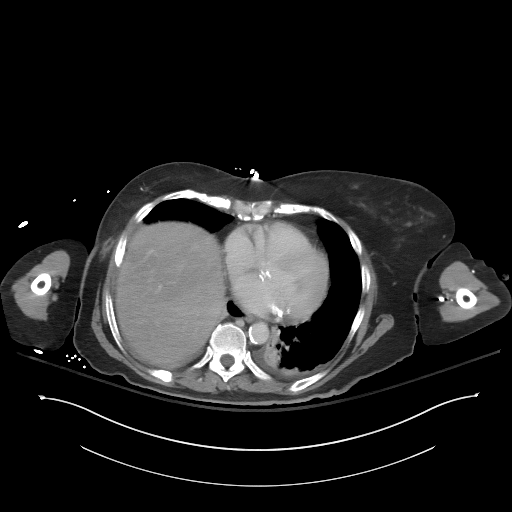
[im 100/107  soft-tissue]
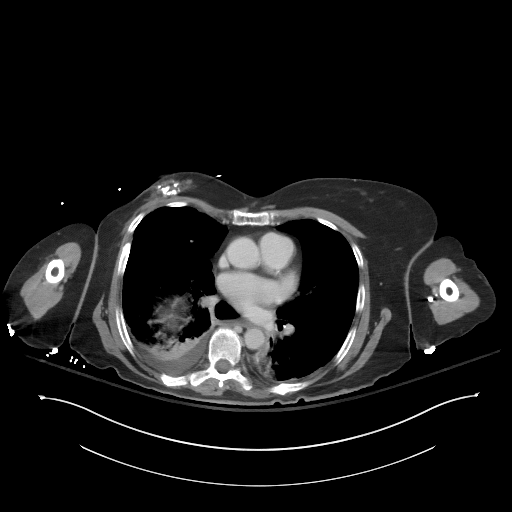

[Series 5: coronal st · coronal · 0.85mm/px · 3 of 92 slices shown]
[im 31/92  soft-tissue]
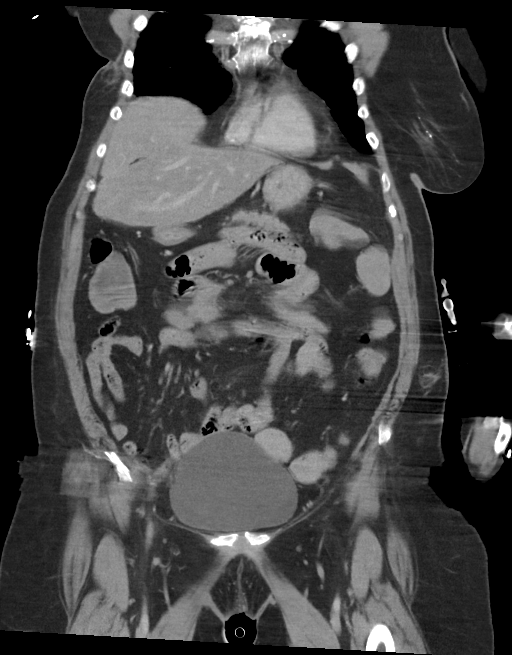
[im 41/92  soft-tissue]
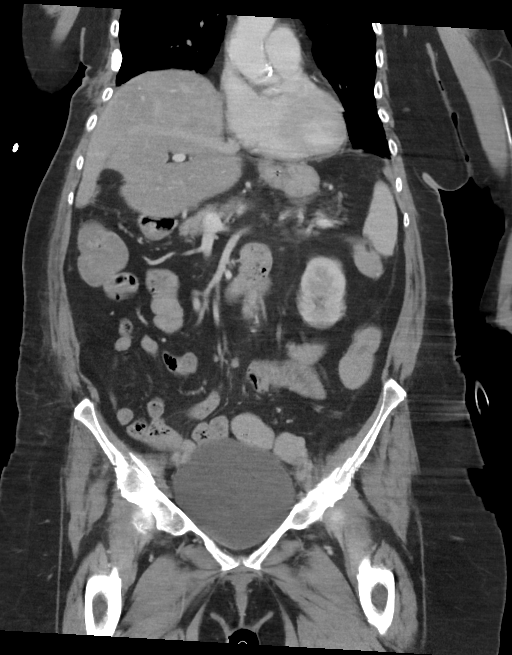
[im 51/92  soft-tissue]
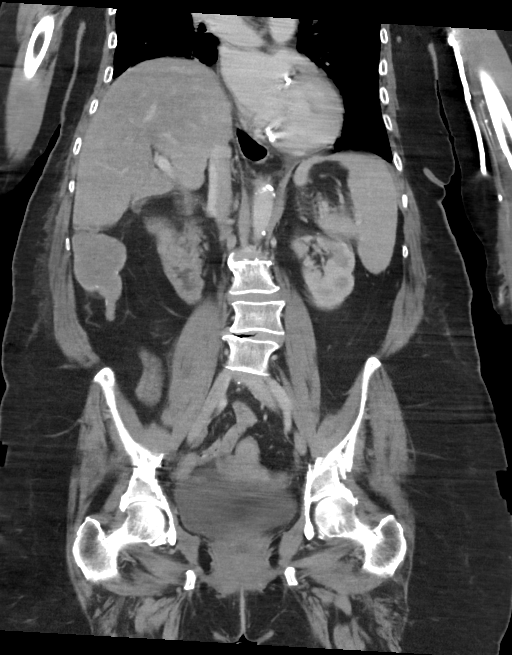

[16 of 46 positions shown; findings below may reference images not displayed]

FINDINGS: Lower chest: Small bilateral pleural effusions are noted with
adjacent subsegmental atelectasis or infiltrates, right greater than
left.

Hepatobiliary: Status post cholecystectomy. No biliary dilatation is
noted. Hepatic steatosis.

Pancreas: Unremarkable. No pancreatic ductal dilatation or
surrounding inflammatory changes.

Spleen: Normal in size without focal abnormality.

Adrenals/Urinary Tract: Adrenal glands are unremarkable. Kidneys are
normal, without renal calculi, focal lesion, or hydronephrosis.
Bladder is unremarkable.

Stomach/Bowel: Stable appearance of gastrostomy with stomach and
balloon partially retracted into subcutaneous tissues. There is no
evidence of bowel obstruction or inflammation. Postsurgical changes
are noted in the right colon.

Vascular/Lymphatic: Aortic atherosclerosis. No enlarged abdominal or
pelvic lymph nodes.

Reproductive: Status post hysterectomy. No adnexal masses.

Other: Stable small fat containing supraumbilical ventral hernia is
noted. No ascites is noted. Grossly stable size and appearance of
rounded abnormality seen to the left of the umbilicus currently
measuring 2.5 x 1.5 cm. Potentially this may represent a small
abscess as noted on prior exam.

Musculoskeletal: No acute or significant osseous findings.
IMPRESSION: Small bilateral pleural effusions are noted with adjacent
subsegmental atelectasis or infiltrates, right greater than left.

Stable size and appearance of rounded abnormality seen to the left
of the umbilicus in the subcutaneous tissues in the anterior
abdominal wall which may represent small abscess as noted on prior
exam.

Stable small fat containing supraumbilical ventral hernia.

Hepatic steatosis.

Aortic Atherosclerosis ([id number].C).

## 2023-03-25 IMAGING — DX DG CHEST 1V PORT
2 series · 2 of 2 positions shown · non-contrast
Comparison: 02/26/2021

CLINICAL DATA: Pulmonary edema

EXAM:
PORTABLE CHEST 1 VIEW

[chest ap (1 of 2)]
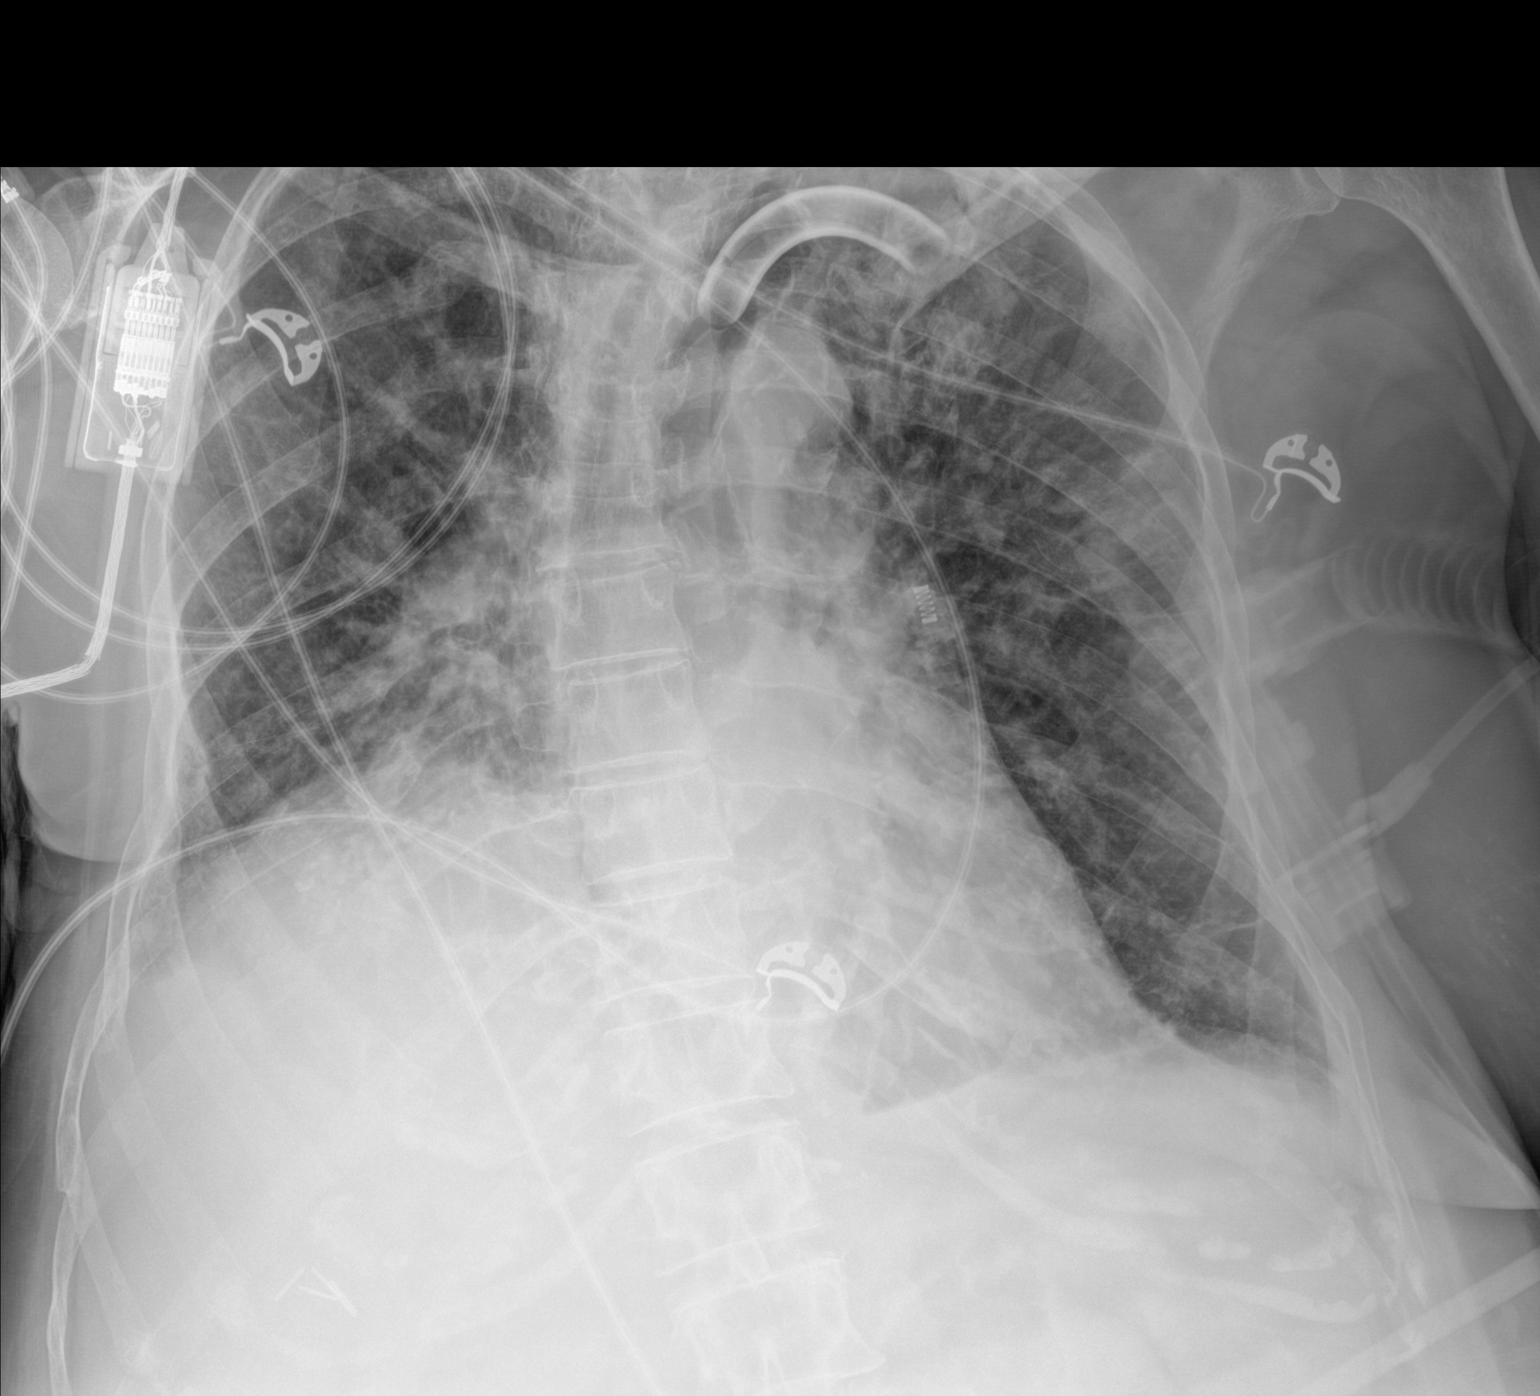

[chest ap (2 of 2)]
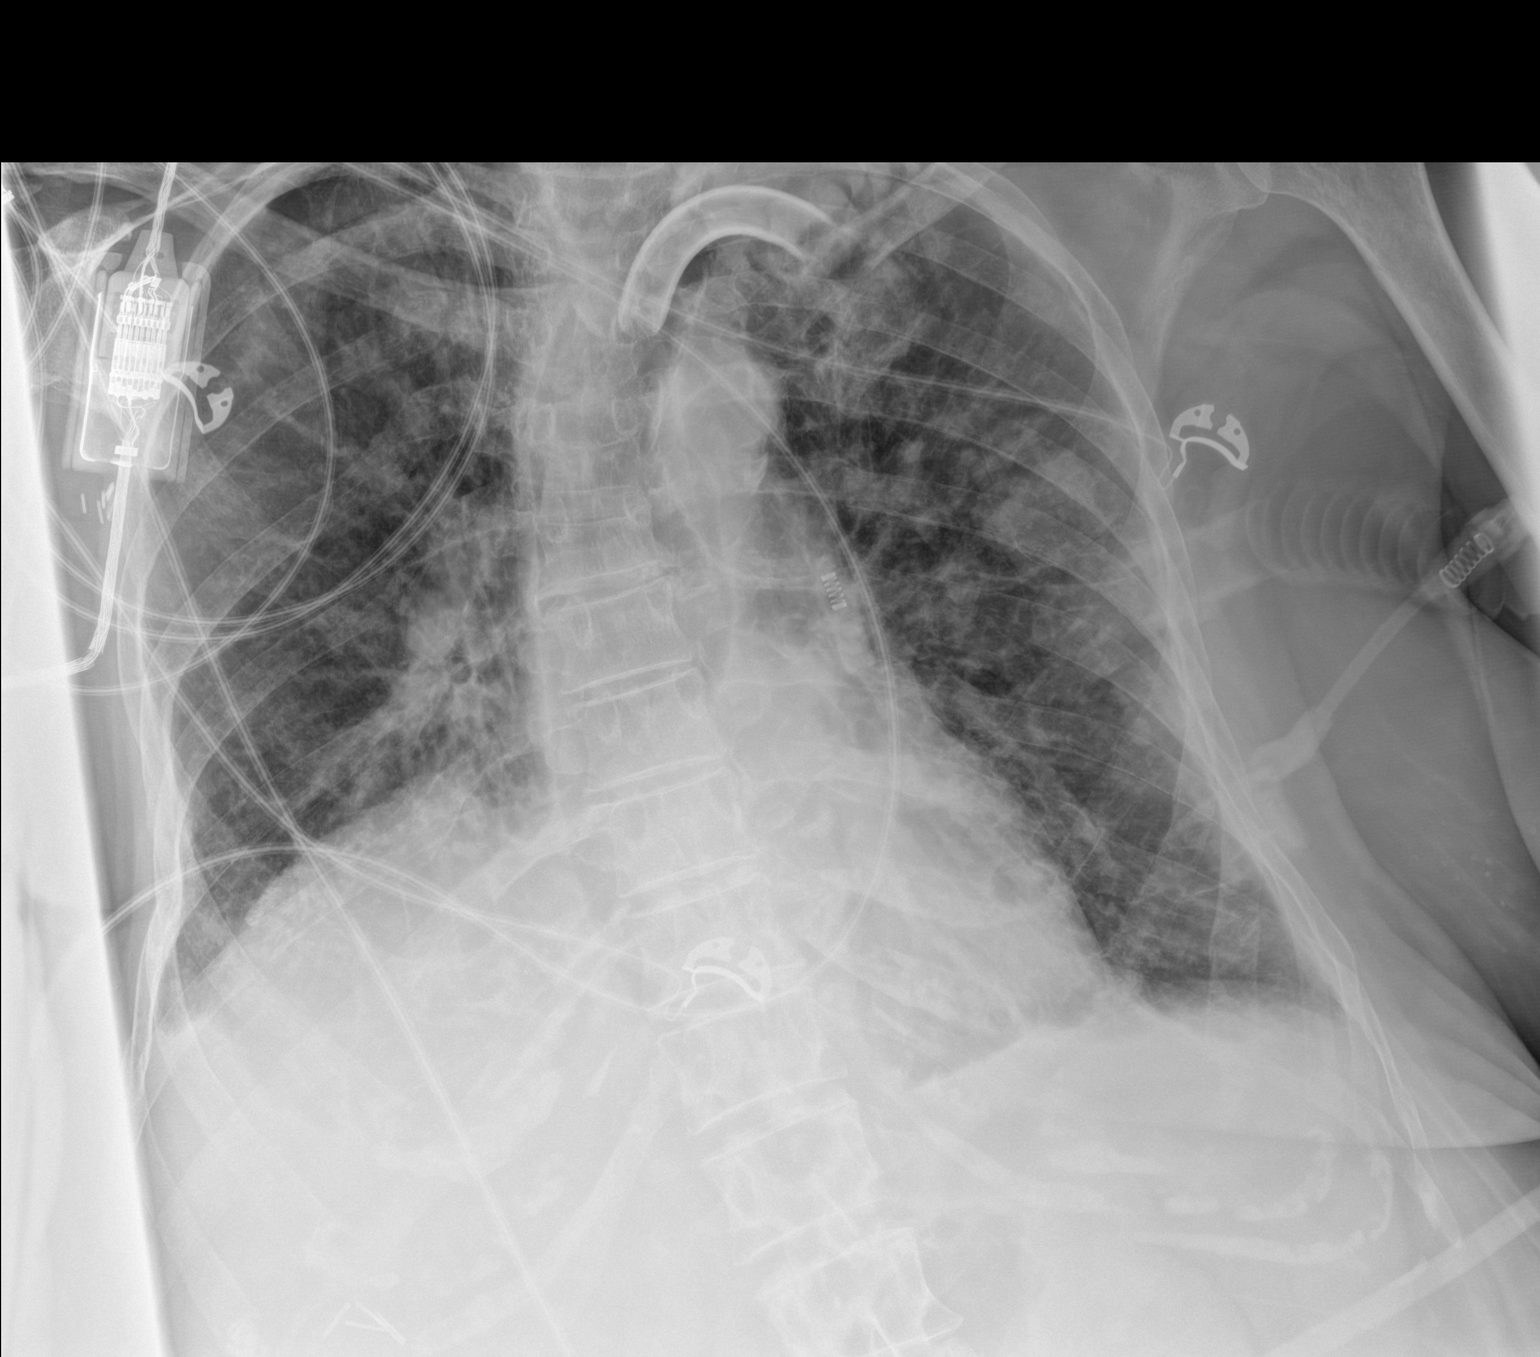

[2 of 2 positions shown; findings below may reference images not displayed]

FINDINGS: Tracheostomy remains in place. Heart size remains normal. Chronic
elevation of the right hemidiaphragm with chronic volume loss at the
right lung base. Pulmonary venous hypertension and mild interstitial
edema pattern persists. No visible measurable effusion. Small amount
of layering pleural fluid with shown, right more than left, at
previous CT.
IMPRESSION: No radiographic change. Pulmonary venous hypertension with possible
mild interstitial edema.

## 2023-03-26 IMAGING — DX DG CHEST 1V PORT
1 series · 1 of 1 positions shown · non-contrast
Comparison: Chest radiograph from one day prior.

CLINICAL DATA: Follow-up right pulmonary opacity

EXAM:
PORTABLE CHEST 1 VIEW

[chest ap]
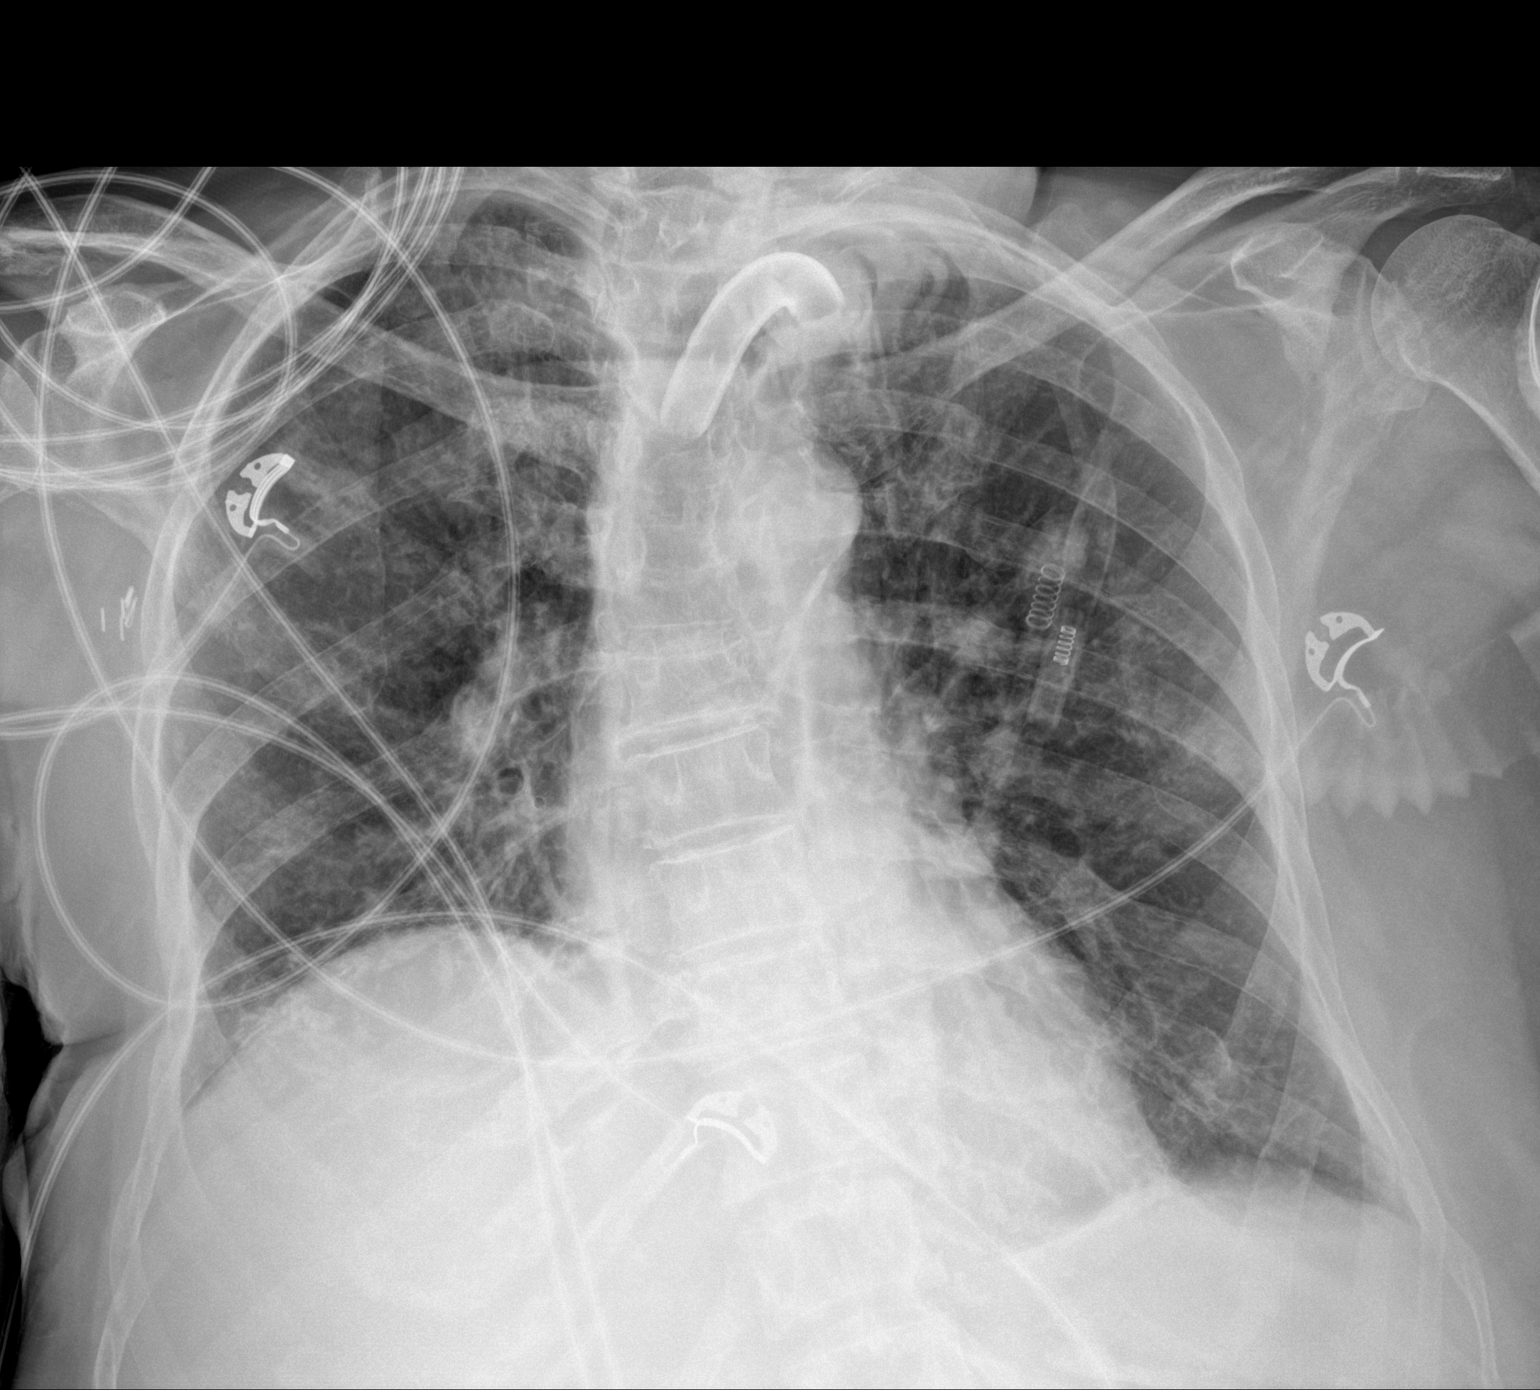

[1 of 1 positions shown; findings below may reference images not displayed]

FINDINGS: Well-positioned tracheostomy tube with tip overlying the tracheal
air column at the thoracic inlet. Stable cardiomediastinal
silhouette with normal heart size. No pneumothorax. No pleural
effusion. Chronic moderate elevation of the right hemidiaphragm. No
pulmonary edema. Improved aeration at the right lung base with mild
curvilinear bibasilar scarring versus atelectasis. No acute
consolidative airspace disease.
IMPRESSION: Improved aeration at the right lung base with mild curvilinear
bibasilar scarring versus atelectasis. Chronic moderate elevation of
the right hemidiaphragm. No acute consolidative airspace disease to
suggest a pneumonia.

## 2023-04-29 ENCOUNTER — Ambulatory Visit (INDEPENDENT_AMBULATORY_CARE_PROVIDER_SITE_OTHER): Payer: Medicare Other | Admitting: Surgery

## 2023-04-29 ENCOUNTER — Encounter: Payer: Self-pay | Admitting: Surgery

## 2023-04-29 VITALS — BP 115/72 | HR 87 | Temp 98.1°F | Ht 65.0 in | Wt 158.0 lb

## 2023-04-29 DIAGNOSIS — K9423 Gastrostomy malfunction: Secondary | ICD-10-CM

## 2023-04-29 NOTE — Patient Instructions (Signed)
 Interventional Radiology will call you about scheduling a GTube exchange. If you do not hear from them by Thursday please call us.   Follow-up with our office as needed.  Please call and ask to speak with a nurse if you develop questions or concerns.

## 2023-04-30 ENCOUNTER — Other Ambulatory Visit: Payer: Self-pay | Admitting: Interventional Radiology

## 2023-04-30 DIAGNOSIS — K9189 Other postprocedural complications and disorders of digestive system: Secondary | ICD-10-CM

## 2023-04-30 DIAGNOSIS — R633 Feeding difficulties, unspecified: Secondary | ICD-10-CM

## 2023-05-01 ENCOUNTER — Encounter: Payer: Self-pay | Admitting: Surgery

## 2023-05-01 NOTE — Progress Notes (Addendum)
 Outpatient Surgical Follow Up   Angela David is an 83 y.o. female.   Chief Complaint  Patient presents with   Follow-up    HPI: Angela David is an 83 year old female well-known to me with chronic respiratory failure and she is on the ventilator at night.  She also has a chronic tracheostomy.  I did perform robotic gastrostomy tube 2021.  This has require replacement and the last time interventional radiology upsize it to a gastrojejunostomy tube.  Unfortunately they did not have good experience at patient passed a guidewire or a plastic from  IR catheter.  There is significant leakage from the tube itself.  She endorses no fevers no chills she did have a prolonged hospitalization for chronic respiratory failure.  She is Certainly very debilitated and fragile She did have a CT that I have personally reviewed showing that the stomach has herniated to the abdominal wall.  The gastrostomy tube is in place. She endorses some intermittent discomofrt . She is able to take PO by mouth. She is staying at her sister's   Past Medical History:  Diagnosis Date   Breast cancer, right (HCC) 2002   Tx'd with chemotherapy and XRT   Chronic respiratory failure (HCC)    s/p tracheostomy in 2012; on ventilatory support at hs   Diabetic peripheral neuropathy (HCC) 08/28/2016   GERD (gastroesophageal reflux disease)    Glaucoma    High cholesterol    History of colon polyps    Osteoarthritis    Personal history of chemotherapy 2002   BREAST CA   Personal history of radiation therapy 2002   BREAST CA   S/P percutaneous endoscopic gastrostomy (PEG) tube placement (HCC)    T2DM (type 2 diabetes mellitus) (HCC)    Tracheostomy dependent (HCC) 2012   Ventral hernia    Vocal cord paralysis 2012    Past Surgical History:  Procedure Laterality Date   ABDOMINAL HYSTERECTOMY     BREAST BIOPSY Right 2008   neg   BREAST BIOPSY Right 06/30/2020   "Q" clip-path pending   BREAST EXCISIONAL BIOPSY Right 2002    positive   BREAST LUMPECTOMY Right 2002   chemo and radiation   BREAST LUMPECTOMY WITH RADIOFREQUENCY TAG IDENTIFICATION Right 10/18/2020   Procedure: BREAST LUMPECTOMY WITH RADIOFREQUENCY TAG IDENTIFICATION;  Surgeon: Leafy Ro, MD;  Location: [facility];  Service: General;  Laterality: Right;   BREAST SURGERY Right 2002   LUMPECTOMY, radiation, chemotherapy   CATARACT EXTRACTION, BILATERAL     CHOLECYSTECTOMY N/A 12/10/2017   Procedure: LAPAROSCOPIC CHOLECYSTECTOMY;  Surgeon: Sung Amabile, DO;  Location: [facility];  Service: General;  Laterality: N/A;   COLON RESECTION Right 11/06/2018   Procedure: HAND ASSISTED LAPAROSCOPIC COLON RESECTION, RIGHT;  Surgeon: Leafy Ro, MD;  Location: [facility];  Service: General;  Laterality: Right;   COLONOSCOPY  2009,12/30/2013   COLONOSCOPY WITH PROPOFOL N/A 04/16/2018   Tubulovillous polyp proximal transverse colon, large sessile polyp, proximal transverse colon;  Surgeon: Earline Mayotte, MD;  Location: [facility];  Service: Endoscopy;  Laterality: N/A;   ESOPHAGOGASTRODUODENOSCOPY (EGD) WITH PROPOFOL N/A 10/14/2018   Procedure: ESOPHAGOGASTRODUODENOSCOPY (EGD) WITH PROPOFOL;  Surgeon: Midge Minium, MD;  Location: [facility];  Service: Endoscopy;  Laterality: N/A;   IR GASTR TUBE CONVERT GASTR-JEJ PER W/FL MOD SED  01/10/2022   [name] TUBE CHANGE  07/30/2022   IR REPLACE G-TUBE SIMPLE WO FLUORO  11/29/2021   IR REPLC GASTRO/COLONIC TUBE PERCUT W/FLUORO  11/24/2021   TRACHEAL SURGERY  2013   TRACHEOSTOMY N/A    Dr Elenore Rota   XI ROBOTIC ASSISTED VENTRAL HERNIA N/A 10/18/2020   Procedure: XI ROBOTIC ASSISTED VENTRAL HERNIA;  Surgeon: Leafy Ro, MD;  Location: [facility];  Service: General;  Laterality: N/A;    Family History  Problem Relation Age of Onset   Cancer Father        bone cancer   Cancer Sister        lymphoma   Cancer Brother    Breast cancer Neg Hx     Social History:  reports that she has never smoked. She has  been exposed to tobacco smoke. She has never used smokeless tobacco. She reports that she does not drink alcohol and does not use drugs.  Allergies:  Allergies  Allergen Reactions   Shellfish Allergy Shortness Of Breath    respitatory distress    Sodium Sulfite Shortness Of Breath    Severe Congestion that creates inability to breath   Sulfa Antibiotics Shortness Of Breath and Other (See Comments)    respitatory distress   Codeine Other (See Comments)    Per patient "it kept her up all night"   Glucerna [Alimentum] Itching   Wound Dressing Adhesive Rash    Medications reviewed.    ROS Full ROS performed and is otherwise negative other than what is stated in HPI   BP 115/72   Pulse 87   Temp 98.1 F (36.7 C)   Ht 5\' 5"  (1.651 m)   Wt 158 lb (71.7 kg)   SpO2 98%   BMI 26.29 kg/m   Physical Exam Vitals and nursing note reviewed. Exam conducted with a chaperone present.  Constitutional:      General: She is not in acute distress.    Comments: Chronically ill and debilitated, in a wheelchair w family  Eyes:     General: No scleral icterus. Neck:     Comments: Trach in place she is able to oclude the trach and talk short sentences Pulmonary:     Effort: Pulmonary effort is normal. No respiratory distress.     Breath sounds: No stridor. Rhonchi present.  Abdominal:     General: Abdomen is flat. There is no distension.     Palpations: Abdomen is soft. There is no mass.     Tenderness: There is no abdominal tenderness. There is no guarding.     Comments: G-J in place with leak within tubing  Musculoskeletal:        General: Normal range of motion.     Cervical back: Normal range of motion. No rigidity or tenderness.  Lymphadenopathy:     Cervical: No cervical adenopathy.  Skin:    General: Skin is warm and dry.     Capillary Refill: Capillary refill takes less than 2 seconds.  Neurological:     General: No focal deficit present.     Mental Status: She is alert and  oriented to person, place, and time.  Psychiatric:        Mood and Affect: Mood normal.        Behavior: Behavior normal.        Thought Content: Thought content normal.        Judgment: Judgment normal.      Assessment/Plan: 83 year old female well-known to me issues now with gastrojejunostomy issues, there seems to be a hole within the tubing of the gastrojejunostomy. Given her poor regurgitation and deconditioning I do think that she has hernia in her stomach fortunately  he has not really.  Discussed with her in detail about the findings. I do think that her better option will be an exchange the gastrostomy and may be conversion to only gastrostomy tube By I. Radiology. I do think that doing a big operation will have significant pitfalls.  She is certainly debilitated and with his chronic respiratory failure she is at high risk for developing perioperative complications Best bet is to go interventional radiology route.  Please note that I spent 40 minutes in this encounter including personally, placing orders and performing    Sterling Big, MD [facility] General Surgeon

## 2023-05-01 NOTE — Progress Notes (Signed)
 Patient for [name] tube exchange to a IR G tube on Thurs 05/02/23, I called and spoke with the patient's niece, Angela David on the phone and gave pre-procedure instructions. Angela David was made aware to have the patient here at 1:30p and check in at the new entrance. Pt stated understanding.  Called 04/30/23

## 2023-05-02 ENCOUNTER — Ambulatory Visit
Admission: RE | Admit: 2023-05-02 | Discharge: 2023-05-02 | Disposition: A | Discharge status: Home / Self Care | Source: Ambulatory Visit | Attending: Interventional Radiology | Admitting: Interventional Radiology

## 2023-05-02 DIAGNOSIS — R633 Feeding difficulties, unspecified: Secondary | ICD-10-CM | POA: Insufficient documentation

## 2023-05-02 HISTORY — PX: IR REPLC GASTRO/COLONIC TUBE PERCUT W/FLUORO: IMG2333

## 2023-05-02 MED ORDER — STERILE WATER FOR INJECTION IJ SOLN
INTRAMUSCULAR | Status: AC
  Filled 2023-05-02: qty 20

## 2023-05-02 MED ORDER — IOHEXOL 300 MG/ML  SOLN
30.0000 mL | Freq: Once | INTRAMUSCULAR | Status: AC | PRN
  Administered 2023-05-02: 30 mL

## 2023-05-08 ENCOUNTER — Ambulatory Visit: Admitting: Emergency Medicine

## 2023-05-08 ENCOUNTER — Other Ambulatory Visit: Payer: Self-pay | Admitting: Internal Medicine

## 2023-05-08 ENCOUNTER — Telehealth: Payer: Self-pay | Admitting: Internal Medicine

## 2023-05-08 DIAGNOSIS — J209 Acute bronchitis, unspecified: Secondary | ICD-10-CM

## 2023-05-08 MED ORDER — AZITHROMYCIN 250 MG PO TABS: ORAL_TABLET | ORAL | 0 refills | Status: DC

## 2023-05-08 MED ORDER — PREDNISONE 20 MG PO TABS: 20.0000 mg | ORAL_TABLET | Freq: Every day | ORAL | 1 refills | Status: DC

## 2023-05-08 NOTE — Telephone Encounter (Signed)
 Pt showed for an acute visit that was scheduled in [place]. Is asking for prednisone and antibiotic to be sent to preferred pharm. I told them if he decides he wants her to have a cxr prior to appointment we will call them

## 2023-05-08 NOTE — Telephone Encounter (Signed)
 Per Dr. Belia Heman, no chest xray needed at this time.  Nothing further needed.

## 2023-05-08 NOTE — Progress Notes (Unsigned)
 ABX and PED ordered

## 2023-05-09 ENCOUNTER — Encounter: Payer: Self-pay | Admitting: Internal Medicine

## 2023-05-09 ENCOUNTER — Telehealth: Payer: Self-pay

## 2023-05-09 ENCOUNTER — Ambulatory Visit: Admitting: Internal Medicine

## 2023-05-09 ENCOUNTER — Other Ambulatory Visit (HOSPITAL_COMMUNITY): Payer: Self-pay

## 2023-05-09 VITALS — BP 116/70 | HR 81 | Temp 97.6°F | Ht 65.0 in | Wt 153.0 lb

## 2023-05-09 DIAGNOSIS — J9621 Acute and chronic respiratory failure with hypoxia: Secondary | ICD-10-CM

## 2023-05-09 DIAGNOSIS — Z930 Tracheostomy status: Secondary | ICD-10-CM

## 2023-05-09 DIAGNOSIS — J9622 Acute and chronic respiratory failure with hypercapnia: Secondary | ICD-10-CM

## 2023-05-09 DIAGNOSIS — J208 Acute bronchitis due to other specified organisms: Secondary | ICD-10-CM

## 2023-05-09 DIAGNOSIS — J440 Chronic obstructive pulmonary disease with acute lower respiratory infection: Secondary | ICD-10-CM | POA: Diagnosis not present

## 2023-05-09 DIAGNOSIS — J15212 Pneumonia due to Methicillin resistant Staphylococcus aureus: Secondary | ICD-10-CM

## 2023-05-09 DIAGNOSIS — J9611 Chronic respiratory failure with hypoxia: Secondary | ICD-10-CM

## 2023-05-09 DIAGNOSIS — Z931 Gastrostomy status: Secondary | ICD-10-CM

## 2023-05-09 MED ORDER — IPRATROPIUM-ALBUTEROL 0.5-2.5 (3) MG/3ML IN SOLN: 3.0000 mL | Freq: Four times a day (QID) | RESPIRATORY_TRACT | 10 refills | Status: AC | PRN

## 2023-05-09 NOTE — Patient Instructions (Signed)
 Continue prednisone as prescribed Continue antibiotics as prescribed Recommend using DuoNebs every 6 hours for the next 3 days and then use them as needed Continue ventilator at night  Avoid Allergens and Irritants Avoid secondhand smoke Avoid SICK contacts Recommend  Masking  when appropriate Recommend Keep up-to-date with vaccinations  Continue frequent suctioning Go to ER if symptoms worsen

## 2023-05-09 NOTE — Progress Notes (Signed)
 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  Evidence of pneumonia, MRSA. 01/22: CT Head revealed no CT evidence for acute intracranial abnormality. Atrophy and presumed chronic small vessel ischemic change of the white matter.  Sinusitis noted. 01/22: CTA Chest revealed no pulmonary embolus. Dilated fluid-filled esophagus from the thoracic inlet to the gastroesophageal junction, may be reflux or dysmotility. Achalasia also considered. Mild dependent opacities in both lower lobes, likely atelectasis. Given esophageal dilatation, aspiration is also considered. Subacute right rib fractures. Some interval incomplete fracture healing since October 2020 injury. Stable right upper lobe scarring and benign pulmonary nodules. Aortic Atherosclerosis (ICD10-I70.0). 01/23: Pt admitted to ICU  03/22/19- Patient is clincally improved, of mechanical ventilation.  Starting PT/OT with plan to transition to hospitalist service. 1/28 returned to ICU due to episode of loss of consciousness on MedSurg floor.  This was due to recurrent issues with hypercapnia it was determined patient would need at the very least nighttime ventilation.  Patient placed on mechanical ventilation. 1/29 off vent during day, eating by mouth, speech evaluated.  Nighttime ventilation. 1/30 remains off vent during day, eating by mouth.  Continue nighttime mechanical ventilation.  Referred to DME for  Trilogy home ventilator. 2/01 husband and niece to be trained  on Trilogy vent. 2/02 unable to use Trilogy vent last night due to alarms going off.  DME company needs to review settings 2/03 successful overnight use of Trilogy ventilator, stable and ready for discharge    CHIEF COMPLAINT:  Chronic respiratory failure vent dependent Status post trach PEG History of MRSA pneumonia Acute viral bronchitis   HISTORY OF PRESENT ILLNESS: Chronic trach chronic ventilator Patient with increased secretions Low oxygen levels As a cuffed nonfenestrated trach Patient vocalized by using a finger She has evidence of infection Was prescribed prednisone and antibiotics  Patient still needs trilogy ventilator for survival Primary nutrition no support is via PEG tube  Patient with acute COPD exacerbation at this time No indication for admission to the hospital Patient started on antibiotics and prednisone Continue frequent suctioning to prevent thick mucous plugs Can use nebulizer treatments scheduled every 6 hours for the next 3 days and then as needed    PAST MEDICAL HISTORY :   has a past medical history of Breast cancer, right (HCC) (2002), Chronic respiratory failure (HCC), Diabetic peripheral neuropathy (HCC) (08/28/2016), GERD (gastroesophageal reflux disease), Glaucoma, High cholesterol, History of colon polyps, Osteoarthritis, Personal history of chemotherapy (2002), Personal history of radiation therapy (2002), S/P percutaneous endoscopic gastrostomy (PEG) tube placement (HCC), T2DM (type 2 diabetes mellitus) (HCC), Tracheostomy dependent (HCC) (2012), Ventral hernia, and Vocal cord paralysis (2012).  has a past surgical history that includes Abdominal hysterectomy; Colonoscopy (2009,12/30/2013); Tracheal surgery (2013); Breast surgery (Right, 2002); Tracheostomy (N/A); Cholecystectomy (N/A, 12/10/2017); Colonoscopy with propofol (N/A, 04/16/2018); Esophagogastroduodenoscopy (egd) with  propofol (N/A, 10/14/2018); Colon resection (Right, 11/06/2018); Breast lumpectomy (Right, 2002); Breast biopsy (Right, 2008); Breast excisional biopsy (Right, 2002); Breast biopsy (Right, 06/30/2020); Cataract extraction, bilateral; Breast lumpectomy with radiofrequency tag identification (Right, 10/18/2020); XI robotic assisted ventral hernia (N/A, 10/18/2020);  IR Replc Gastro/Colonic Tube Percut W/Fluoro (11/24/2021); IR REPLACE G-TUBE SIMPLE WO FLUORO (11/29/2021); IR GASTR TUBE CONVERT GASTR-JEJ PER W/FL MOD SED (01/10/2022); [name] Tube Change (07/30/2022); and IR Replc Gastro/Colonic Tube Percut W/Fluoro (05/02/2023). Prior to Admission medications   Medication Sig Start Date End Date Taking? Authorizing Provider  acetaminophen (TYLENOL) 325 MG tablet Take 2 tablets (650 mg total) by mouth every 6 (six) hours as needed for moderate pain or fever. 04/01/19   Salena Saner, MD  Biotin 1000 MCG tablet Take 1,000 mcg by mouth daily.     [provider]  busPIRone (BUSPAR) 15 MG tablet Take 1 tablet (15 mg total) by mouth 2 (two) times daily. 04/01/19   Salena Saner, MD  Calcium Carbonate-Vitamin D 600-400 MG-UNIT tablet Take 1 tablet by mouth daily.     [provider]  escitalopram (LEXAPRO) 20 MG tablet Take 20 mg by mouth daily.    [provider]  feeding supplement, ENSURE ENLIVE, (ENSURE ENLIVE) LIQD Take 237 mLs by mouth 2 (two) times daily between meals. 04/02/19   Salena Saner, MD  folic acid (FOLVITE) 1 MG tablet Take 1 mg by mouth daily.    [provider]  gabapentin (NEURONTIN) 400 MG capsule Take 1 capsule (400 mg total) by mouth 3 (three) times daily. 04/01/19   Salena Saner, MD  ipratropium-albuterol (DUONEB) 0.5-2.5 (3) MG/3ML SOLN Take 3 mLs by nebulization every 6 (six) hours as needed. 04/01/19   Salena Saner, MD  iron polysaccharides (FERREX 150) 150 MG capsule Take by mouth. 04/30/18 04/30/19  [provider]  lovastatin  (MEVACOR) 20 MG tablet Take 20 mg by mouth at bedtime.    [provider]  meclizine (ANTIVERT) 25 MG tablet Take 25 mg by mouth 2 (two) times daily as needed for dizziness.     [provider]  Melatonin 5 MG TABS Take 1 tablet (5 mg total) by mouth at bedtime. 04/01/19   Salena Saner, MD  metFORMIN (GLUCOPHAGE) 1000 MG tablet Take 1,000 mg by mouth 2 (two) times daily as needed (for BS >110 in the morning and >150 in the evening).     [provider]  methocarbamol (ROBAXIN) 500 MG tablet Take 1 tablet (500 mg total) by mouth every 8 (eight) hours as needed for muscle spasms. 12/29/18   Donovan Kail, PA-C  metoprolol tartrate (LOPRESSOR) 25 MG tablet Take 1 tablet (25 mg total) by mouth 2 (two) times daily. 04/01/19   Salena Saner, MD  Nebulizers (COMPRESSOR/NEBULIZER) MISC Nebulizer with supplies, trach collar for med admin. 04/01/19   Salena Saner, MD  ondansetron (ZOFRAN) 4 MG tablet Take 1 tablet (4 mg total) by mouth every 8 (eight) hours as needed for nausea or vomiting. 11/09/18   Pabon, Diego F, MD  pantoprazole (PROTONIX) 40 MG tablet Take 40 mg by mouth daily.     [provider]  RESTASIS 0.05 % ophthalmic emulsion Place 1 drop into both eyes 2 (two) times daily.     [provider]  rOPINIRole (REQUIP) 0.5 MG tablet Take 0.5 mg by mouth at bedtime.  06/09/18   [provider]   Allergies  Allergen Reactions   Shellfish Allergy Shortness Of Breath    respitatory distress    Sodium Sulfite Shortness Of Breath    Severe Congestion that creates inability to breath   Sulfa Antibiotics Shortness Of Breath and Other (See Comments)    respitatory distress  Codeine Other (See Comments)    Per patient "it kept her up all night"   Glucerna [Alimentum] Itching   Wound Dressing Adhesive Rash    FAMILY HISTORY:  family history includes Cancer in her brother, father, and sister. SOCIAL HISTORY:  reports that she has  never smoked. She has been exposed to tobacco smoke. She has never used smokeless tobacco. She reports that she does not drink alcohol and does not use drugs.   BP 116/70 (BP Location: Left Arm, Patient Position: Sitting, Cuff Size: Normal)   Pulse 81   Temp 97.6 F (36.4 C) (Temporal)   Ht 5\' 5"  (1.651 m)   Wt 153 lb (69.4 kg)   SpO2 93%   BMI 25.46 kg/m      Review of Systems: Gen:  Denies  fever, sweats, chills weight loss  HEENT: Denies blurred vision, double vision, ear pain, eye pain, hearing loss, nose bleeds, sore throat Cardiac:  No dizziness, chest pain or heaviness, chest tightness,edema, No JVD Resp:   No cough, -sputum production, +shortness of breath,+wheezing, -hemoptysis,  Other:  All other systems negative   Physical Examination:   General Appearance: No distress  EYES PERRLA, EOM intact.   NECK Trach intact let me Pulmonary: normal breath sounds, + wheezing.  CardiovascularNormal S1,S2.  No m/r/g.   Abdomen: Benign, Soft, non-tender. Neurology UE/LE 5/5 strength, no focal deficits Ext pulses intact, cap refill intact ALL OTHER ROS ARE NEGATIVE      ASSESSMENT AND PLAN SYNOPSIS  83 year old pleasant white female seen today for follow-up acute on chronic hypoxic and hypercapnic respiratory failure for MRSA pneumonia with a history of COPD complicated by vocal cord dysfunction status post tracheostomy status post PEG tube placement with chronic ventilatory support   Acute COPD exacerbation with acute bronchitis Continue prednisone Continue antibiotics Recommend DuoNebs every 6 hours for the next 3 days then as needed Recommend frequent suctioning Continue ventilator therapy at night  Chronic proximal respiratory failure and hypercapnic respiratory failure Patient still needs trilogy ventilator therapy at night for survival This helps reduce hospital admissions and recurrent COPD exacerbations  Chronic vocal cord dysfunction Continue  tracheostomy ENT in [facility]  Chronic aspiration syndrome Patient now has PEG tube Patient still remains at risk for aspiration Follow-up with general surgery in [facility]   MEDICATION ADJUSTMENTS/LABS AND TESTS ORDERED: Continue prednisone as prescribed Continue antibiotics as prescribed Recommend using DuoNebs every 6 hours for the next 3 days and then use them as needed Continue ventilator at night Avoid Allergens and Irritants Avoid secondhand smoke Avoid SICK contacts Recommend  Masking  when appropriate Recommend Keep up-to-date with vaccinations Continue frequent suctioning Go to ER if symptoms worsen   CURRENT MEDICATIONS REVIEWED AT LENGTH WITH PATIENT TODAY   Patient/Family are satisfied with Plan of action and management. All questions answered   Follow-up in 6 months  Total time spent 45 minutes   Lucie Leather, M.D.  [facility]  Medical Director [facility] Medical Director [facility] Cardio-Pulmonary Department

## 2023-05-09 NOTE — Telephone Encounter (Signed)
 Pharmacy Patient Advocate Encounter  Received notification from [employer] that Prior Authorization for Ipratropium-Albuterol 0.5-2.5 (3)MG/3ML solution  has been CANCELLED due to medication needs to be processed under Medicare Part B. New script may need to be sent with ICD 10 code documented.   PA #/Case ID/Reference #: [id number]

## 2023-05-13 ENCOUNTER — Ambulatory Visit: Admitting: Internal Medicine

## 2023-05-14 NOTE — Discharge Summary (Addendum)
 Discharge Summary   Admit Date: 05/11/2023 Discharge Date: 05/15/2023 Admitting Physician: Lynda Laneta Estimable, MD  Discharge Physician: Estimable Lynda Laneta, MD  Primary Care Provider: Valere Ozell Shove, MD  Admission Diagnoses:  Hypoxia [R09.02]  Discharge Diagnoses:  Principal Problem:   Acute hypoxic respiratory failure (CMS/HHS-HCC) Active Problems:   Malignant neoplasm of right female breast (CMS/HHS-HCC)   Hyperlipidemia   Tracheostomy dependence (CMS/HHS-HCC)   Type 2 diabetes mellitus with diabetic neuropathy, without long-term current use of insulin  (CMS/HHS-HCC)   Moderate episode of recurrent major depressive disorder (CMS/HHS-HCC) Resolved Problems:   * No resolved hospital problems. *   Consult Orders: IP CONSULT TO INTENSIVIST IP CONSULT TO WOUND MANAGEMENT [facility] IP CASE MANAGEMENT - CONSULT TO CARE COORDINATION SPECIALIST IP CONSULT TO GASTROENTEROLOGY- GENERAL   Procedures Performed:   05/13/2023 - Flexible Endoscopic Evaluation of Swallowing 05/13/2023 - Barium Swallow  05/14/2023 - Upper GI Endoscopy, Dr. Laneta Shams  Surgeries Performed: None  Brief History of Present Illness & Hospital Course: Angela David is a 83 y.o. female with acute on chronic respiratory failure with chronic tracheostomy reportedly as a result of Vocal Cord Dysfunction on ventilator at nighttime, presented to [facility] ED on 05/11/2023 for evaluation of hypoxia and difficulty breathing, now back to room air during the day which is her baseline and using her home ventilator at night. CT revealing Stenosis at the GE junction concerning for silent aspiration with possible aspiration pneumonia. Patient was recently treated as an outpatient with Azithromycin  on 3/13 for three days for presumed pneumonia. Sputum cultures here were positive for Pseudomonas thought most likely to be from chronic colonization. PSa was felt to be chronic colonization since she returned back to baseline so quickly  wihtout direct treatment. She received 3 additional days of IV azithromycin  3/16-3/19 to cover for underlying pneumonia, but remained afebrile with no growth on blood cultures. Recommend making an appointment for follow up with her outpatient pulmonologist, Dr. Isaiah, to discuss decolonization.   Patient underwent Upper Endoscopy 3/18 with Dr. Laneta Shams with dilation in the middle third and distal portion of the esophagus. Will need follow up with [facility] - Debarah Jess Louann Selmer.   Speech Therapy has also been working with Angela David - determined patient able to safely tolerate a regular diet with mildly (nectar) thick liquids.  Discharge Exam:  General appearance: alert, appears stated age, and cooperative Throat: lips, mucosa, and tongue normal; teeth and gums normal Lungs: clear to auscultation bilaterally Heart: regular rate and rhythm, S1, S2 normal, no murmur, click, rub or gallop Abdomen: soft, non-tender; bowel sounds normal; no masses,  no organomegaly Extremities: no edema, redness or tenderness in the calves or thighs Pulses: 2+ and symmetric Skin: Skin color, texture, turgor normal. No rashes or lesions Neurologic: Alert and oriented X 3, normal strength and tone. Normal symmetric reflexes. Normal coordination and gait  Discharge Disposition:  Home with home health:   Allergies: Allergies  Allergen Reactions  . Egg Anaphylaxis and Rash  . Egg Derived Other (See Comments)    respitatory distress  . Shellfish Containing Products Other (See Comments)    Other reaction(s): OTHER respitatory distress   . Sodium Sulfite Other (See Comments)  . Sulfite Other (See Comments)  . Codeine Rash    Patient Instructions:  Current Discharge Medication List     CONTINUE these medications which have NOT CHANGED   Details  acetaminophen  (TYLENOL ) 500 MG tablet Take 1,000 mg by mouth 3 (three) times a  day    buPROPion  (WELLBUTRIN  SR) 150 MG SR tablet  Take 1 tablet by mouth 2 (two) times daily    ezetimibe  (ZETIA ) 10 mg tablet Take 1 tablet (10 mg total) by mouth once daily Qty: 90 tablet, Refills: 3    folic acid  (FOLVITE ) 1 MG tablet Take 1 tablet by mouth once daily    gabapentin  (NEURONTIN ) 300 MG capsule Take 1 capsule 3 times a day by oral route.    HUMALOG KWIKPEN INSULIN  pen injector (concentration 100 units/mL) Inject 8 Units subcutaneously 3 (three) times daily with meals Qty: 15 mL, Refills: 2    melatonin 5 mg Cap Take 5 mg by mouth at bedtime    metoprolol  tartrate (LOPRESSOR ) 25 MG tablet Take 25 mg by mouth once daily    pantoprazole  (PROTONIX ) 40 MG DR tablet TAKE 1 TABLET BY MOUTH TWICE A DAY Qty: 60 tablet, Refills: 5   Associated Diagnoses: Chronic RUQ pain    rOPINIRole  (REQUIP ) 0.5 MG tablet Take 1 tablet by mouth at bedtime    TOUJEO  SOLOSTAR U-300 INSULIN  pen injector (concentration 300 units/mL) Inject 20 Units subcutaneously once daily MAX DAILY DOSE 45 UNITS Qty: 4.5 mL, Refills: 3    blood glucose diagnostic (CONTOUR NEXT TEST STRIPS) test strip by XX route 2 (two) times daily Use as instructed. Qty: 200 each, Refills: 3    escitalopram  oxalate (LEXAPRO ) 20 MG tablet Take 20 mg by mouth once daily    estradioL  (ESTRACE ) 0.01 % (0.1 mg/gram) vaginal cream Place vaginally twice a week       STOP taking these medications     diphenhydrAMINE  (BENADRYL ) 25 mg capsule         Code Status:  Full Code  Activity:  activity as tolerated  Diet: @DIETSUPPORDS @  Pertinent Recent Labs:  Lab Results  Component Value Date   WBC 10.6 (H) 05/15/2023   HGB 14.0 05/15/2023   HCT 41.9 05/15/2023   PLT 191 05/15/2023   Lab Results  Component Value Date   NA 138 05/15/2023   K 4.0 05/15/2023   CL 106 05/15/2023   CO2 23 05/15/2023   BUN 20 05/15/2023   CREATININE 0.8 05/15/2023   GLUCOSE 135 05/15/2023   Lab Results  Component Value Date   COLORU Light Yellow 05/12/2023   CLARITYU Clear  05/12/2023   SPECGRAV 1.044 (H) 05/12/2023   GLUCOSEU Negative 05/12/2023   KETONESU Negative 05/12/2023   BLOODU Negative 05/12/2023   NITRITE Negative 05/12/2023   LEUKOCYTESUR 3+ (!) 05/12/2023   BILIRUBINUR Negative 05/12/2023   UROBILINOGEN 0.2 05/12/2023   RBCUA 12 (H) 05/12/2023   WBCUA 93 (H) 05/12/2023   SQUAMEPI 2 05/12/2023   BACTERIA Rare (!) 06/09/2018   Significant Diagnostic Studies: microbiology: blood culture: NGTD and sputum culture: pseudomonas, and endoscopy: Upper Endoscopy, FEES, Barium study.  Functional Status (ADLs, mobility, cognitive): Ambulatory with walker. PT/OT/HHA/MSW/ Wound Care  Wound Care: as directed Discharge Condition: Returned to previous baseline  Results Pending at Discharge:  None  Follow-up Tests Recommended:  None  Future Appointments  Date Time Provider Department Center  07/24/2023  1:30 PM Brendia Calton Squires, PA [facility]   Please make follow up appointments as directed with PCP, Dr. Herminio, Dr. Isaiah (pulmonologist) and [facility] within the next week.   Time Spent:  90 minutes  LINSEY RAE DAVISON, NP 05/15/2023

## 2023-05-14 NOTE — H&P (Signed)
 GI PRE-PROCEDURE HISTORY AND PHYSICAL   Angela David presents for the following scheduled GI procedure(s):  Upper Endoscopy  Diagnostic .   Scheduled Procedure(s): Upper Endoscopy (EGD).   The indication for the procedure(s) is Abnormal barium swallow [R93.3].     I have reviewed the patient's history and physical and I have re-examined the patient prior to the procedure. There have been no significant recent changes in the patient's medical status.  History: Past Medical History:  Diagnosis Date  . Anxiety   . Breast cancer (CMS/HHS-HCC)   . Diabetes mellitus type 2, uncomplicated (CMS/HHS-HCC)   . GERD (gastroesophageal reflux disease)   . Hyperlipidemia   . Neuropathy   . Osteoarthritis     Past Surgical History:  Procedure Laterality Date  . CHOLECYSTECTOMY  12/10/2017   Dr Henriette Pierre  . HYSTERECTOMY    . MASTECTOMY PARTIAL / LUMPECTOMY    . TRACHEOSTOMY      Allergies Allergies  Allergen Reactions  . Egg Anaphylaxis and Rash  . Egg Derived Other (See Comments)    respitatory distress  . Shellfish Containing Products Other (See Comments)    Other reaction(s): OTHER respitatory distress   . Sodium Sulfite Other (See Comments)  . Sulfite Other (See Comments)  . Codeine Rash    Medications CONCENTRATED insulin  glargine, acetaminophen , blood glucose diagnostic, buPROPion , escitalopram  oxalate, estradioL , ezetimibe , folic acid , gabapentin , insulin  LISPRO, melatonin, metoprolol  TARTrate, pantoprazole , and rOPINIRole   Recent Anticoagulant or Antiplatelet Use The patient is prescribed no previous anticoagulant or antiplatelet agents.  Prophylactic Antibiotics The patient does not require prophylactic antibiotics   Physical Examination BP (!) 116/90 (BP Location: Left upper arm, Patient Position: Lying, BP Cuff Size: Adult)   Pulse 90   Temp 36.7 C (98.1 F) (Oral)   Resp 22   SpO2 96%  Mental Status: normal Airway: normal oropharyngeal airway and neck  mobility Lungs normal WOB Heart: rrr Abdomen: soft, nondistended, nontender  ASSESSMENT AND PLAN  [name] has been evaluated prior to the planned procedure and deemed appropriate to undergo a Procedure(s): Upper Endoscopy (EGD).

## 2023-05-31 ENCOUNTER — Encounter: Payer: Self-pay | Admitting: Internal Medicine

## 2023-05-31 ENCOUNTER — Ambulatory Visit: Admitting: Internal Medicine

## 2023-05-31 VITALS — BP 118/68 | HR 74 | Ht 65.0 in | Wt 155.0 lb

## 2023-05-31 DIAGNOSIS — J9612 Chronic respiratory failure with hypercapnia: Secondary | ICD-10-CM

## 2023-05-31 DIAGNOSIS — J690 Pneumonitis due to inhalation of food and vomit: Secondary | ICD-10-CM | POA: Diagnosis not present

## 2023-05-31 DIAGNOSIS — Z930 Tracheostomy status: Secondary | ICD-10-CM | POA: Diagnosis not present

## 2023-05-31 DIAGNOSIS — J449 Chronic obstructive pulmonary disease, unspecified: Secondary | ICD-10-CM | POA: Diagnosis not present

## 2023-05-31 DIAGNOSIS — Z9911 Dependence on respirator [ventilator] status: Secondary | ICD-10-CM

## 2023-05-31 DIAGNOSIS — J9611 Chronic respiratory failure with hypoxia: Secondary | ICD-10-CM

## 2023-05-31 NOTE — Progress Notes (Signed)
 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  Evidence of pneumonia, MRSA. 01/22: CT Head revealed no CT evidence for acute intracranial abnormality. Atrophy and presumed chronic small vessel ischemic change of the white matter.  Sinusitis noted. 01/22: CTA Chest revealed no pulmonary embolus. Dilated fluid-filled esophagus from the thoracic inlet to the gastroesophageal junction, may be reflux or dysmotility. Achalasia also considered. Mild dependent opacities in both lower lobes, likely atelectasis. Given esophageal dilatation, aspiration is also considered. Subacute right rib fractures. Some interval incomplete fracture healing since October 2020 injury. Stable right upper lobe scarring and benign pulmonary nodules. Aortic Atherosclerosis (ICD10-I70.0). 01/23: Pt admitted to ICU  03/22/19- Patient is clincally improved, of mechanical ventilation.  Starting PT/OT with plan to transition to hospitalist service. 1/28 returned to ICU due to episode of loss of consciousness on MedSurg floor.  This was due to recurrent issues with hypercapnia it was determined patient would need at the very least nighttime ventilation.  Patient placed on mechanical ventilation. 1/29 off vent during day, eating by mouth, speech evaluated.  Nighttime ventilation. 1/30 remains off vent during day, eating by mouth.  Continue nighttime mechanical ventilation.  Referred to DME for  Trilogy home ventilator. 2/01 husband and niece to be trained  on Trilogy vent. 2/02 unable to use Trilogy vent last night due to alarms going off.  DME company needs to review settings 2/03 successful overnight use of Trilogy ventilator, stable and ready for discharge   RECENT HOSPITAL ADMISSION AT [facility] Admitted for SOB and worsening hypoxia CT revealing Stenosis at the GE junction concerning for silent aspiration with possible aspiration pneumonia. Patient was recently treated as an outpatient with Azithromycin on 3/13 for three days for presumed pneumonia. Sputum cultures here were positive for Pseudomonas thought most likely to be from chronic colonization. PSa was felt to be chronic colonization since she returned back to baseline so quickly wihtout direct treatment. She received 3 additional days of IV azithromycin 3/16-3/19 to cover for underlying pneumonia, but remained afebrile with no growth on blood cultures. Recommend making an appointment for follow up with her outpatient pulmonologist, Dr. Belia Heman, to discuss decolonization.   Patient underwent Upper Endoscopy 3/18 with Dr. Bing Matter with dilation in the middle third and distal portion of the esophagus. Will need follow up with [facility] - Inocente Salles.   Speech Therapy has also been working with [name] - determined patient able to safely tolerate a regular diet with mildly (nectar) thick liquids.       CHIEF COMPLAINT:  Chronic respiratory failure vent dependent Status post trach status post PEG History of MRSA pneumonia Pseudomonal colonization Recent admission for aspiration pneumonia   HISTORY OF PRESENT ILLNESS: Chronic trach, vent dependent Cuffed nonfenestrated Tube vocalizes using a finger Patient was prescribed prednisone and antibiotics on 05/09/23 Patient was admitted to [facility] on  05/11/2023 Diagnosis of aspiration pneumonia Dilatation of the distal esophagus Patient was  discharged without any issues  Patient still needs trilogy ventilator for survival Primary nutritional support via PEG tube  Continue frequent suctioning to prevent thick mucous plugs Can use nebulizer treatments scheduled every 6 hours for the next 3 days and then as needed  No exacerbation at this time No evidence of heart failure at this time No evidence or signs of infection at this time No respiratory distress No fevers, chills, nausea, vomiting, diarrhea No evidence of lower extremity edema No evidence hemoptysis    PAST MEDICAL HISTORY :   has a past medical history of Breast cancer, right (HCC) (2002), Chronic respiratory failure (HCC), Diabetic peripheral neuropathy (HCC) (08/28/2016), GERD (gastroesophageal reflux disease), Glaucoma, High cholesterol, History of colon polyps, Osteoarthritis, Personal history of chemotherapy (2002), Personal history of radiation therapy (2002), S/P percutaneous endoscopic gastrostomy (PEG) tube placement (HCC), T2DM (type 2 diabetes mellitus) (HCC), Tracheostomy dependent (HCC) (2012), Ventral hernia, and Vocal cord paralysis (2012).  has a past surgical history that includes Abdominal hysterectomy; Colonoscopy (2009,12/30/2013); Tracheal surgery (2013); Breast surgery (Right, 2002); Tracheostomy (N/A); Cholecystectomy (N/A, 12/10/2017); Colonoscopy with propofol (N/A, 04/16/2018); Esophagogastroduodenoscopy (egd) with propofol (N/A, 10/14/2018); Colon resection (Right, 11/06/2018); Breast lumpectomy (Right, 2002); Breast biopsy (Right, 2008); Breast excisional biopsy (Right, 2002); Breast biopsy (Right, 06/30/2020); Cataract extraction, bilateral; Breast lumpectomy with radiofrequency tag identification (Right, 10/18/2020); XI robotic assisted ventral hernia (N/A, 10/18/2020); IR Replc Gastro/Colonic Tube Percut W/Fluoro (11/24/2021); IR REPLACE G-TUBE SIMPLE WO FLUORO (11/29/2021); IR GASTR TUBE CONVERT GASTR-JEJ PER W/FL MOD SED (01/10/2022); [name] Tube  Change (07/30/2022); and IR Replc Gastro/Colonic Tube Percut W/Fluoro (05/02/2023). Prior to Admission medications   Medication Sig Start Date End Date Taking? Authorizing Provider  acetaminophen (TYLENOL) 325 MG tablet Take 2 tablets (650 mg total) by mouth every 6 (six) hours as needed for moderate pain or fever. 04/01/19   Salena Saner, MD  Biotin 1000 MCG tablet Take 1,000 mcg by mouth daily.     [provider]  busPIRone (BUSPAR) 15 MG tablet Take 1 tablet (15 mg total) by mouth 2 (two) times daily. 04/01/19   Salena Saner, MD  Calcium Carbonate-Vitamin D 600-400 MG-UNIT tablet Take 1 tablet by mouth daily.     [provider]  escitalopram (LEXAPRO) 20 MG tablet Take 20 mg by mouth daily.    [provider]  feeding supplement, ENSURE ENLIVE, (ENSURE ENLIVE) LIQD Take 237 mLs by mouth 2 (two) times daily between meals. 04/02/19   Salena Saner, MD  folic acid (FOLVITE) 1 MG tablet Take 1 mg by mouth daily.    [provider]  gabapentin (NEURONTIN) 400 MG capsule Take 1 capsule (400 mg total) by mouth 3 (three) times daily. 04/01/19   Salena Saner, MD  ipratropium-albuterol (DUONEB) 0.5-2.5 (3) MG/3ML SOLN Take 3 mLs by nebulization every 6 (six) hours as needed. 04/01/19   Salena Saner, MD  iron polysaccharides (FERREX 150) 150 MG capsule Take by mouth. 04/30/18 04/30/19  [provider]  lovastatin (MEVACOR) 20 MG tablet Take 20 mg by mouth at bedtime.    [provider]  meclizine (ANTIVERT) 25 MG tablet Take 25 mg by mouth 2 (two) times daily as needed for dizziness.     [provider]  Melatonin 5 MG TABS Take 1 tablet (5 mg total) by mouth at bedtime. 04/01/19   Salena Saner, MD  metFORMIN (GLUCOPHAGE) 1000 MG tablet Take 1,000 mg by mouth 2 (two) times daily as needed (for BS >110 in the morning and >150 in the evening).     [provider]  methocarbamol (ROBAXIN)  500 MG tablet Take 1 tablet (500  mg total) by mouth every 8 (eight) hours as needed for muscle spasms. 12/29/18   Donovan Kail, PA-C  metoprolol tartrate (LOPRESSOR) 25 MG tablet Take 1 tablet (25 mg total) by mouth 2 (two) times daily. 04/01/19   Salena Saner, MD  Nebulizers (COMPRESSOR/NEBULIZER) MISC Nebulizer with supplies, trach collar for med admin. 04/01/19   Salena Saner, MD  ondansetron (ZOFRAN) 4 MG tablet Take 1 tablet (4 mg total) by mouth every 8 (eight) hours as needed for nausea or vomiting. 11/09/18   Pabon, Diego F, MD  pantoprazole (PROTONIX) 40 MG tablet Take 40 mg by mouth daily.     [provider]  RESTASIS 0.05 % ophthalmic emulsion Place 1 drop into both eyes 2 (two) times daily.     [provider]  rOPINIRole (REQUIP) 0.5 MG tablet Take 0.5 mg by mouth at bedtime.  06/09/18   [provider]   Allergies  Allergen Reactions   Shellfish Allergy Shortness Of Breath    respitatory distress    Sodium Sulfite Shortness Of Breath    Severe Congestion that creates inability to breath   Sulfa Antibiotics Shortness Of Breath and Other (See Comments)    respitatory distress   Codeine Other (See Comments)    Per patient "it kept her up all night"   Glucerna [Alimentum] Itching   Wound Dressing Adhesive Rash    FAMILY HISTORY:  family history includes Cancer in her brother, father, and sister. SOCIAL HISTORY:  reports that she has never smoked. She has been exposed to tobacco smoke. She has never used smokeless tobacco. She reports that she does not drink alcohol and does not use drugs.  BP 118/68   Pulse 74   Ht 5\' 5"  (1.651 m)   Wt 155 lb (70.3 kg)   SpO2 96%   BMI 25.79 kg/m       Review of Systems: Gen:  Denies  fever, sweats, chills weight loss  HEENT: Denies blurred vision, double vision, ear pain, eye pain, hearing loss, nose bleeds, sore throat Cardiac:  No dizziness, chest pain or heaviness, chest tightness,edema, No JVD Resp:   No cough, -sputum  production, +shortness of breath,-wheezing, -hemoptysis,  Other:  All other systems negative   Physical Examination:   General Appearance: No distress  EYES PERRLA, EOM intact.   NECK Supple, No JVD Pulmonary: normal breath sounds, No wheezing.  CardiovascularNormal S1,S2.  No m/r/g.   Abdomen: Benign, Soft, non-tender. Neurology UE/LE 5/5 strength, no focal deficits Ext pulses intact, cap refill intact ALL OTHER ROS ARE NEGATIVE    ASSESSMENT AND PLAN SYNOPSIS  83 year old pleasant white female seen today for follow-up chronic hypoxic and hypercapnic respiratory failure for MRSA pneumonia with a history of COPD complicated by vocal cord dysfunction status post tracheostomy status post PEG tube placement with chronic ventilatory support, recent hospital admission for aspiration pneumonia with dilated esophagus  COPD with chronic respiratory failure DuoNebs as needed Aggressive pulmonary toilet and frequent suctioning Continue ventilator therapy at night  Chronic proximal respiratory failure and hypercapnic respiratory failure Patient still needs trilogy ventilator therapy at night for survival This helps reduce hospital admissions and recurrent COPD exacerbations  Chronic vocal cord dysfunction Continue tracheostomy ENT in [facility]  Chronic aspiration syndrome Patient now has PEG tube Patient still remains at risk for aspiration Follow-up with general surgery in [facility] Follow-up with GI at [facility] 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  Status post dilatation Recommend taking small sips and small bites to prevent aspiration  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 No active infection at this time I do not recommend antibiotics at this time If patient has fevers or increased work of breathing or change in sputum production I will probably start inhaled antibiotics for pseudomonal coverage But at this time no indication for  therapy    MEDICATION ADJUSTMENTS/LABS AND TESTS ORDERED: Continue ventilator at night Avoid Allergens and Irritants Avoid secondhand smoke Avoid SICK contacts Recommend  Masking  when appropriate Recommend Keep up-to-date with vaccinations Continue Breathing treatments as prescribed PLEASE TAKE SMALL SIPS AND SMALL BITES TO PREVENT ASPIRATION Follow up GI doctor  CURRENT MEDICATIONS REVIEWED AT LENGTH WITH PATIENT TODAY   Patient/Family are satisfied with Plan of action and management. All questions answered  Follow-up in 6 months  Total time spent 42 minutes   Lucie Leather, M.D.  [facility]  Medical Director [facility] Medical Director [facility]

## 2023-05-31 NOTE — Patient Instructions (Signed)
 Continue Breathing treatments as prescribed  PLEASE TAKE SMALL SIPS AND SMALL BITES TO PREVENT ASPIRATION  Continue VENT therapy at night  Follow up GI doctor  Avoid Allergens and Irritants Avoid secondhand smoke Avoid SICK contacts Recommend  Masking  when appropriate Recommend Keep up-to-date with vaccinations

## 2023-07-16 NOTE — Home Health (Signed)
 CLINICAL ASSESSMENT: (Subjective, Objective, Abnormal Findings) Pt received sitting in her WC in the bedroom with husband present throughout session   Pt reports she is having a good day and wants to get better at walking. Pt reports she did walk some yesterday   Pt with good participation in session which included standing strength and balance training, both are progressing slowly. Pt is limited at this time to 80 feet of amb with the SW however she reqs CGA for safety and it is very taxing for the pt.   Plan to dc at next visit to HEP due to max potential at this time being reached, edu pt and husband on compliance with HEP.    -------------------- Close and Confirm -------------------- The following questions were asked following this contact:  1. Is the visit schedule working out for you? Yes  2. Do you have the supplies that you need? Yes  3. What else can we do to help you meet your goals and have the best (home health) experience possible? nothing at this time

## 2023-07-18 NOTE — Home Health (Signed)
 CLINICAL ASSESSMENT:  pt present with spouse. both report pt has been progressing well and is pleased with progress made. pt reports walking hallway of home once daily with spouse assist and is compliant with HEP as instructed.  continues to require assistance with all mobility 2/2 impaired safety. spouse demonstrates safety with patient management.  pt is knowledgeable of all meds. spouse does assist with med prep.  pt has reached max rehab potential with HHPT at this time and is safe and agreeable to d/c at this tie.  ------------ Comparative Functional Reassessment ------------- Since previous evaluation/assessment completed on 06/27/2023 patient demonstrates progress towards goals from the following PRIOR LEVEL OF FUNCTION: ROM/Strength: LLE/LUE 3/5, RLE/RUE 3+/5,  AROM BUE/BLE grossly WFL  Bed Mobility/Transfers: oRoll left: CGA oRoll right: CGA oSupine to sit: min A for safety and efficiency oSit to supine: min A oSit to stand: CGA/min A oPivot bed to chair: CGA/min A oToilet: nt oShower/tub: nt oCar: nt  Gait/Elevation: oEven Surfaces: 40 ft, 30 ft, with SW with CGA, spouse followiwng with w/c o          Uneven Surfaces:w/c dependent  oStairs: n/a oCurb: n/a oGait deviations noted: dec B heel strike, wide BOS, step to gait , ataxic  Objective Balance Outcome Measures: oTinetti balance and gait score: nt  Objective Strength and Endurance Outcome Measures: o          30 second sit to stand test: 3 repetitions  oBORG RPE (activity): 7   -------------- CURRENT LEVEL OF FUNCTION ------------------ ROM/Strength: BLE strength grossly 4/5  Bed Mobility/Transfers: o Roll left: CGA o Roll right: CGA o Supine to sit: minA o Sit to supine: min A o Sit to stand: CGA o Pivot bed to chair: CGA o Toilet: CGA o Shower/tub: n/a  o Car: mod A per report  Gait/Elevation: o Even Surfaces:  o Uneven Surfaces: w/c dependent o Stairs: n/a o Curb: n/a o Gait deviations noted: step  to gait, short B step length  Objective Balance Outcome Measures: o Tinetti balance and gait score: 12/28 indicating a High risk for falls  Objective Strength and Endurance Outcome Measures: o 30 second sit to stand test: 2 repetitions  o BORG RPE (activity):  5  -------------------- Close and Confirm -------------------- The following questions were asked following this contact: Close and Confirm Questions 1.           Have you had any falls since your last visit? no  2. Do you have the supplies that you need? yes  3. Are you set up for supplies once discharged? yes

## 2023-07-20 IMAGING — CT CT ABD-PELV W/ CM
2 of 5 series · 13 of 46 positions shown, 15 images · IV contrast (agent unspecified)
Comparison: Chest x-ray from earlier in the same day, CTs from
02/26/2021 and 02/20/2021

CLINICAL DATA: Tachycardia and diaphoresis, abdominal pain



[Series 3: axial st · axial · 0.82mm/px · z∈[-410,+46]mm · 10 of 103 slices shown, 12 images]
[im 6/103  soft-tissue]
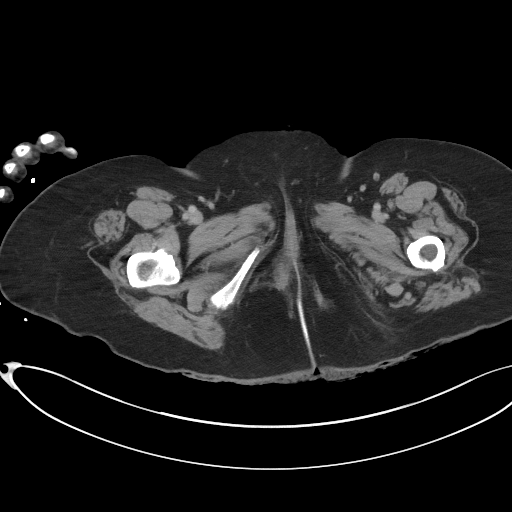
[im 6/103  bone]
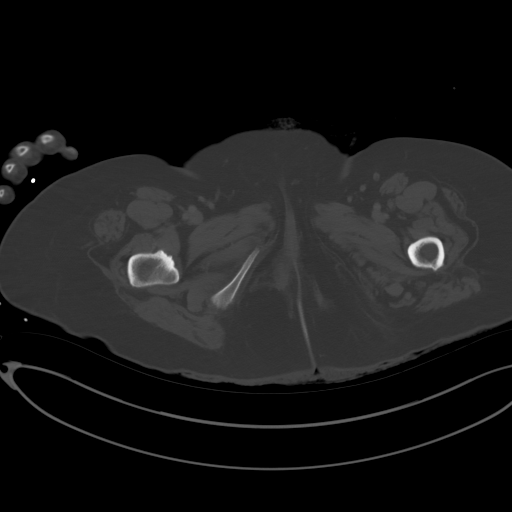
[im 18/103  soft-tissue]
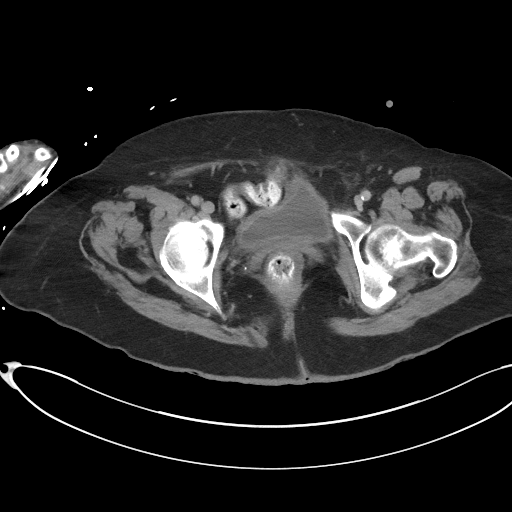
[im 29/103  soft-tissue]
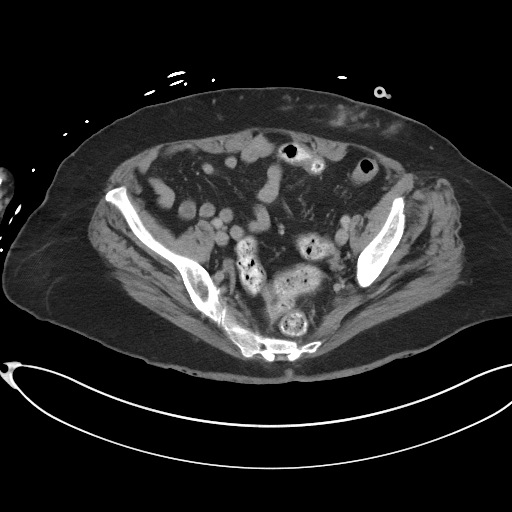
[im 35/103  soft-tissue]
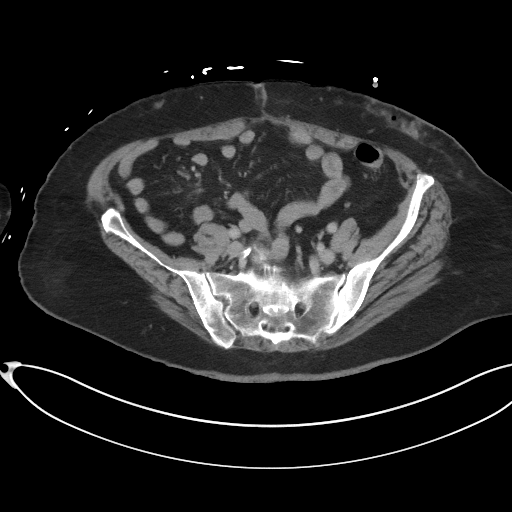
[im 46/103  soft-tissue]
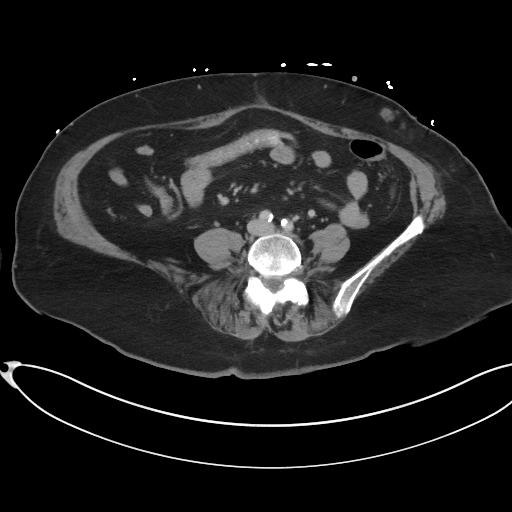
[im 57/103  soft-tissue]
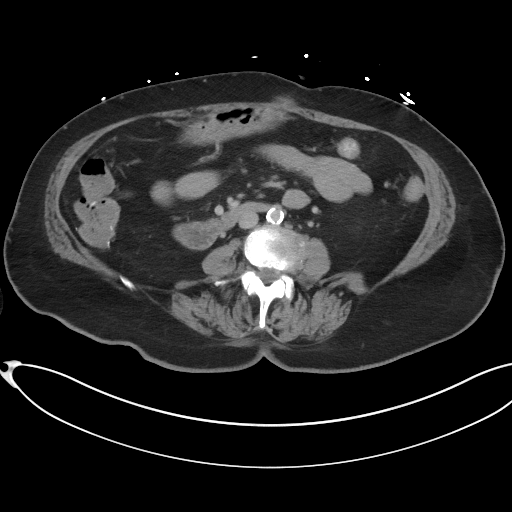
[im 69/103  soft-tissue]
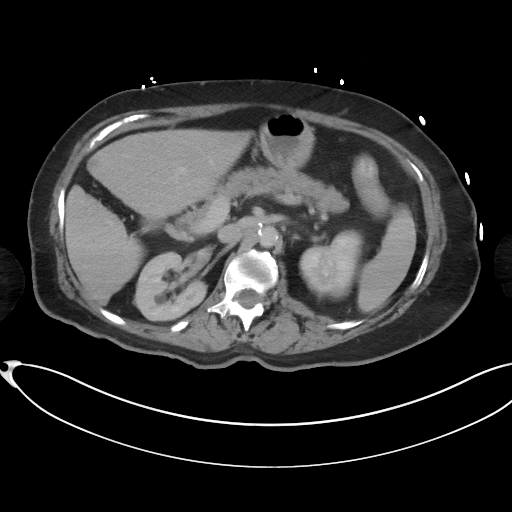
[im 74/103  soft-tissue]
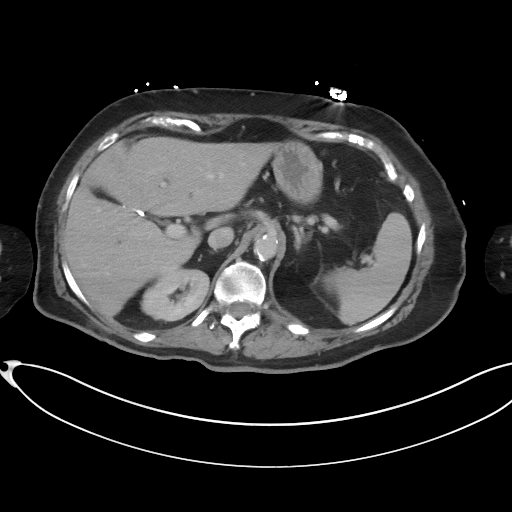
[im 86/103  soft-tissue]
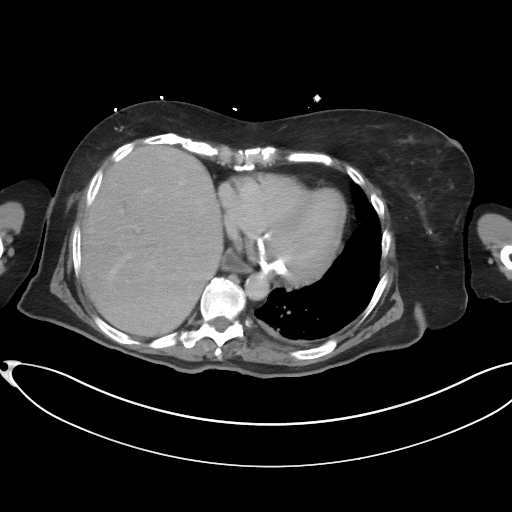
[im 86/103  bone]
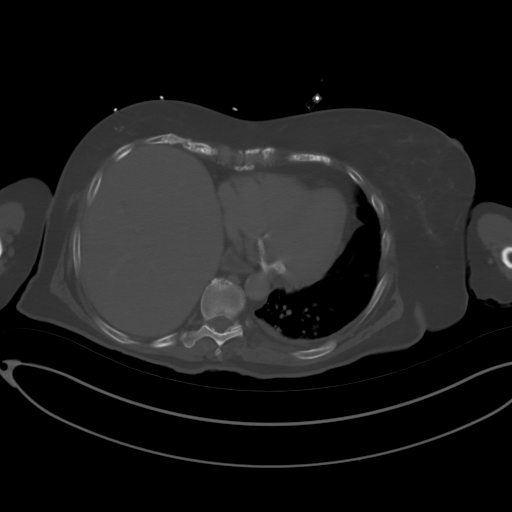
[im 97/103  soft-tissue]
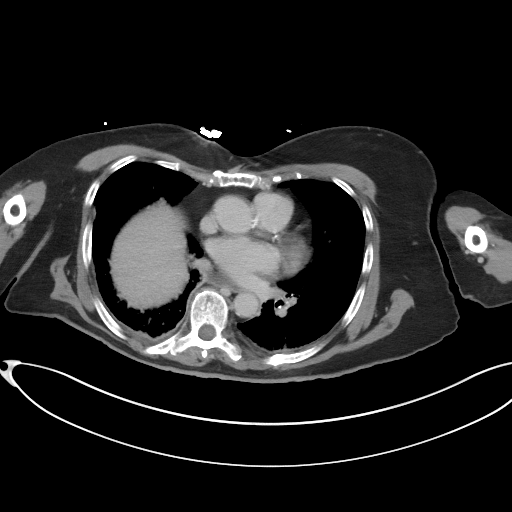

[Series 6: coronal st · coronal · 0.75mm/px · 3 of 87 slices shown]
[im 29/87  soft-tissue]
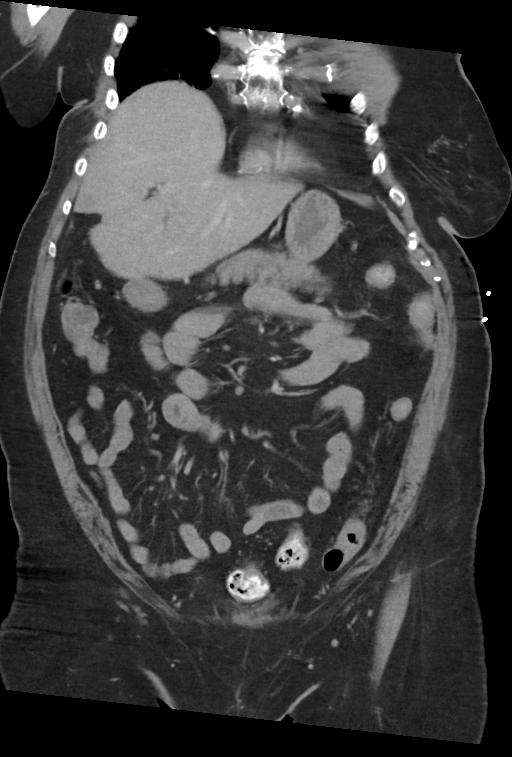
[im 39/87  soft-tissue]
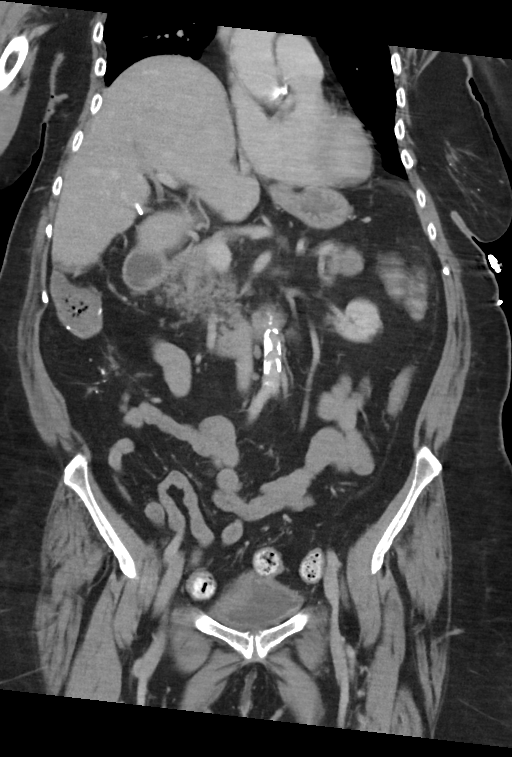
[im 48/87  soft-tissue]
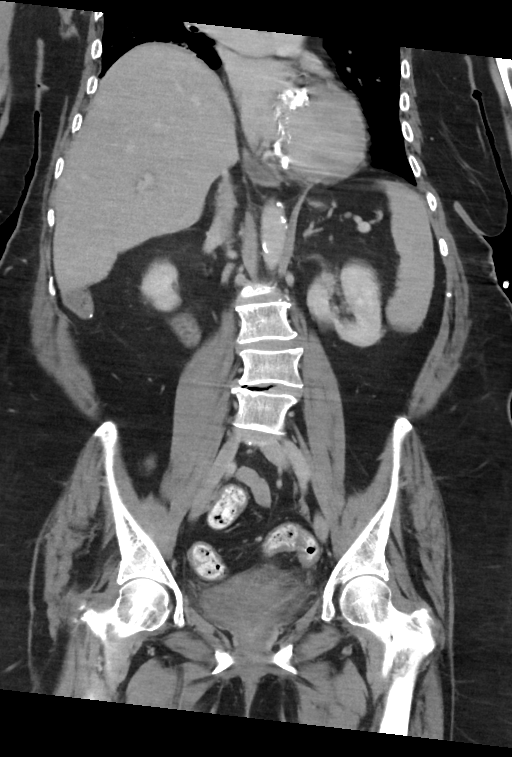

[13 of 46 positions shown; findings below may reference images not displayed]

RADIATION DOSE REDUCTION: This exam was performed according to the
departmental dose-optimization program which includes automated
exposure control, adjustment of the mA and/or kV according to
patient size and/or use of iterative reconstruction technique.

CONTRAST:  75mL OMNIPAQUE IOHEXOL 350 MG/ML SOLN
FINDINGS: CTA CHEST FINDINGS

Cardiovascular: Atherosclerotic calcifications of the thoracic aorta
are noted. No aneurysmal dilatation or dissection is noted. No
cardiac enlargement is seen. Coronary calcifications are noted. The
pulmonary artery shows a normal branching pattern bilaterally. No
filling defect to suggest pulmonary embolism is noted.

Mediastinum/Nodes: Thoracic inlet is within normal limits. The
esophagus is patulous and air-filled with some fluid within the mid
to distal esophagus likely related to reflux. No sizable hilar or
mediastinal adenopathy is noted. Tracheostomy tube is noted in
satisfactory position.

Lungs/Pleura: Previously seen right-sided pleural effusion has
resolved in the interval. 5 mm nodule is noted in the superior
segment of the right lower lobe new from the prior exam. A few
smaller lower lobe nodules are noted best seen on image number 38 of
series 7 as well as image number 41 of series 7. The left lung
demonstrates a few small nodules measuring less than 5 mm scattered
throughout the left lung primarily within the lower lobe. No focal
confluent infiltrate or effusion is seen. Scarring in the right
upper lobe is noted.

Musculoskeletal: Degenerative changes of the thoracic spine are
noted. Old healed rib fractures are noted bilaterally. No acute rib
fracture is seen.

Review of the MIP images confirms the above findings.

CT ABDOMEN and PELVIS FINDINGS

Hepatobiliary: No focal liver abnormality is seen. Status post
cholecystectomy. No biliary dilatation.

Pancreas: Unremarkable. No pancreatic ductal dilatation or
surrounding inflammatory changes.

Spleen: Normal in size without focal abnormality.

Adrenals/Urinary Tract: Adrenal glands are within normal limits.
Kidneys demonstrate a normal enhancement pattern bilaterally. No
renal calculi or obstructive changes are seen. The bladder is
partially distended.

Stomach/Bowel: No obstructive or inflammatory changes of the colon
are seen. The appendix is within normal limits. Postsurgical changes
in the ascending colon are noted. Small bowel and stomach are within
normal limits with the exception of a gastrostomy catheter in
satisfactory position.

Vascular/Lymphatic: Aortic atherosclerosis. No enlarged abdominal or
pelvic lymph nodes.

Reproductive: Status post hysterectomy. No adnexal masses.

Other: No abdominal wall hernia or abnormality. No abdominopelvic
ascites. Mild laxity of the anterior abdominal wall is again noted.

Musculoskeletal: No acute or significant osseous findings.

Review of the MIP images confirms the above findings.
IMPRESSION: CTA of the chest: No evidence of pulmonary emboli.

Multiple 5 mm pulmonary nodules. No follow-up needed if patient is
low-risk (and has no known or suspected primary neoplasm).
Non-contrast chest CT can be considered in 12 months if patient is
high-risk. This recommendation follows the consensus statement:
Guidelines for Management of Incidental Pulmonary Nodules Detected
[DATE].

CT of the abdomen and pelvis: Postsurgical changes are noted.

Acute abnormality is noted.

## 2023-07-20 IMAGING — DX DG CHEST 1V PORT
1 series · 1 of 1 positions shown · non-contrast
Comparison: 03/02/2021

CLINICAL DATA: Shortness of breath

EXAM:
PORTABLE CHEST 1 VIEW

[chest ap]
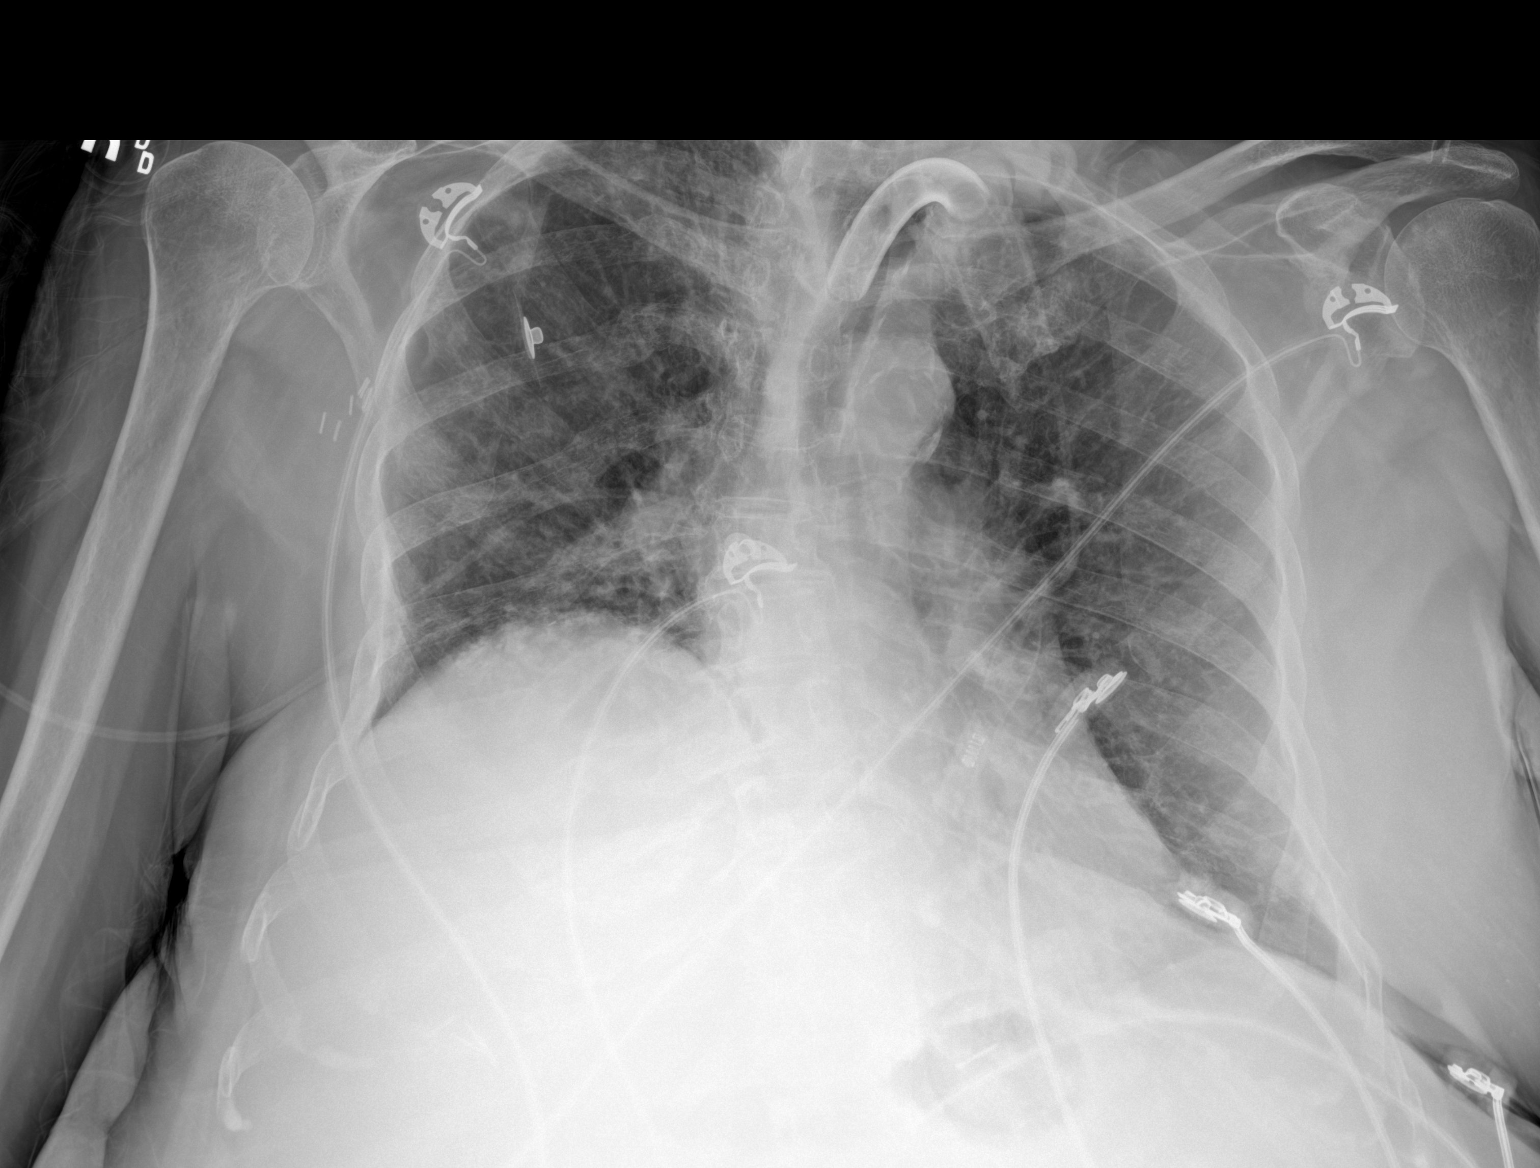

[1 of 1 positions shown; findings below may reference images not displayed]

FINDINGS: Cardiac shadow is stable. Tracheostomy tube is noted in satisfactory
position. Elevation the right hemidiaphragm is noted. No focal
confluent infiltrate is seen. Very mild right basilar atelectasis is
seen. No bony abnormality is noted.
IMPRESSION: Mild right basilar atelectasis.

## 2023-07-22 IMAGING — DX DG CHEST 1V PORT
1 series · 1 of 1 positions shown · non-contrast
Comparison: 06/26/2021

CLINICAL DATA: Acute on chronic respiratory failure.

EXAM:
PORTABLE CHEST 1 VIEW

[chest ap]
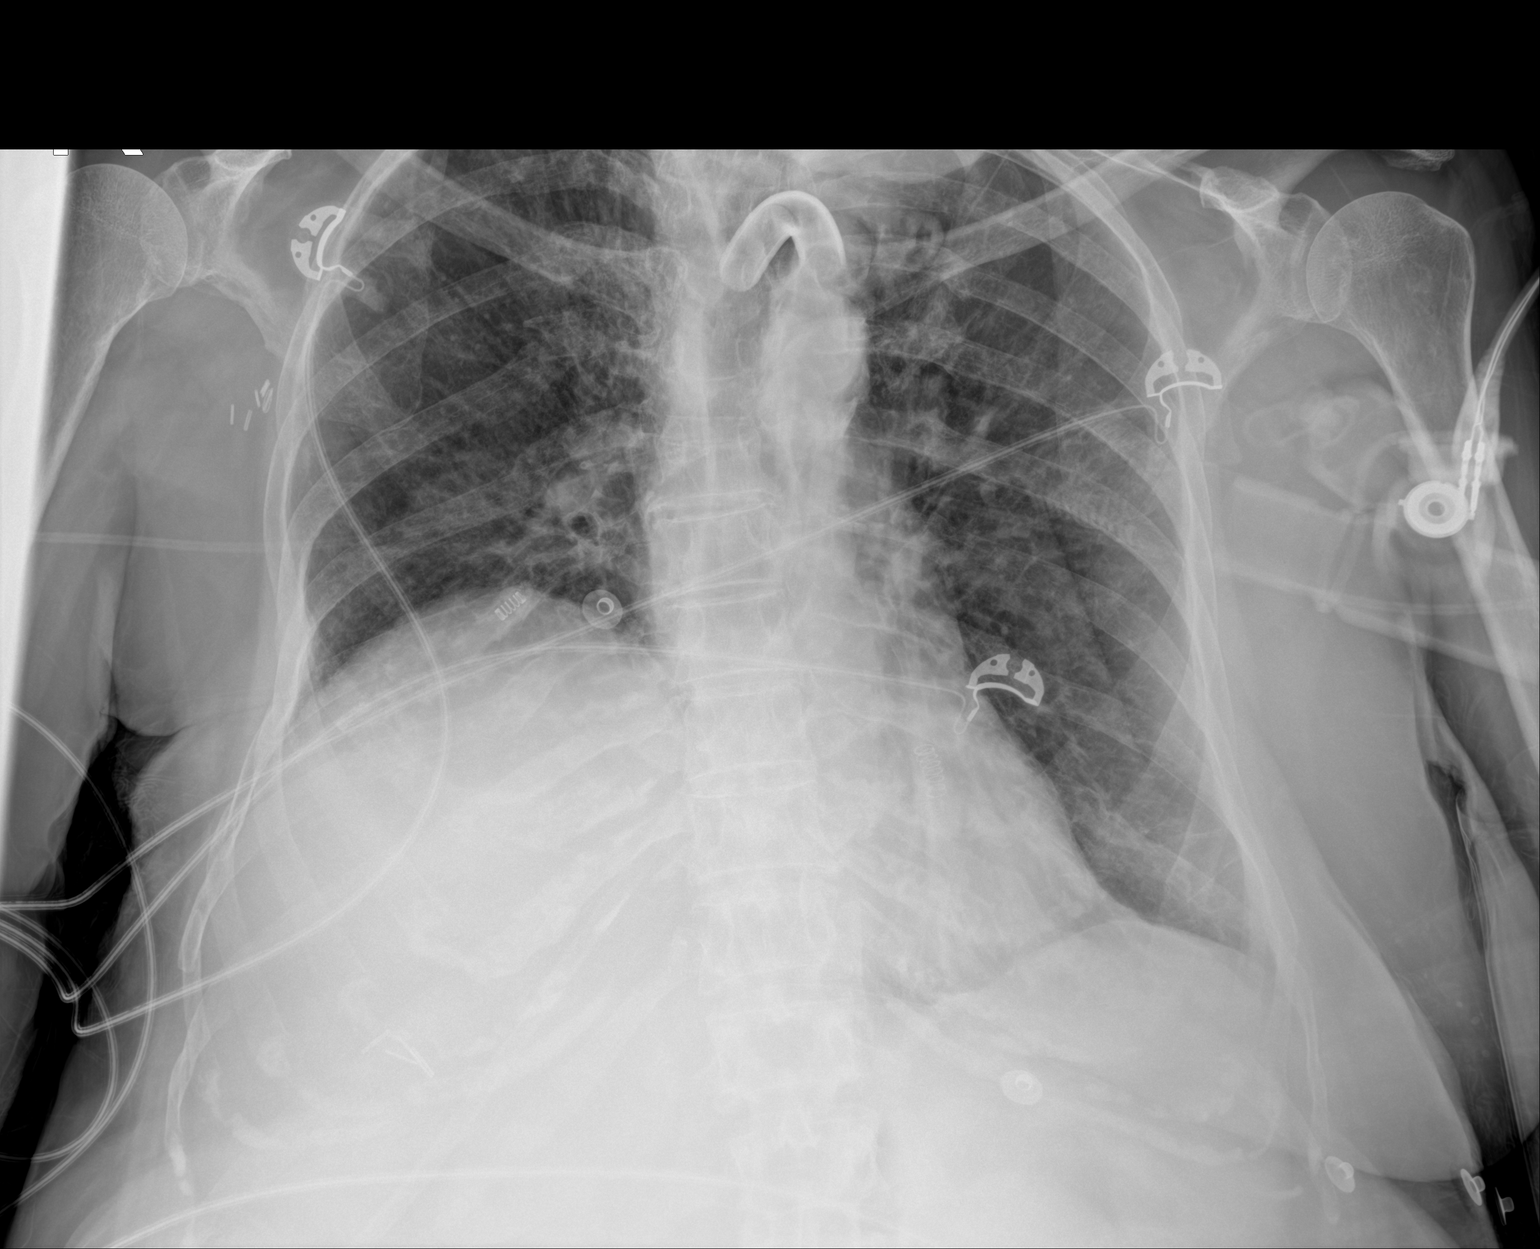

[1 of 1 positions shown; findings below may reference images not displayed]

FINDINGS: Stable asymmetric elevation right hemidiaphragm. Tracheostomy tube
again noted. Interstitial markings are diffusely coarsened with
chronic features. No focal airspace consolidation or pleural
effusion. Telemetry leads overlie the chest.
IMPRESSION: Stable exam. Underlying chronic interstitial changes without
evidence for edema or focal consolidation.

## 2023-09-02 IMAGING — CT CT ABD-PELV W/O CM
2 of 4 series · 14 of 46 positions shown, 16 images · non-contrast
Comparison: Plain film 07/05/2021.  CT 06/26/2021.

CLINICAL DATA: Abdominal pain.  G-tube came out.



[Series 2: routine abd/pel wo · axial · 0.92mm/px · z∈[-351,+124]mm · 11 of 110 slices shown, 13 images]
[im 10/110  soft-tissue]
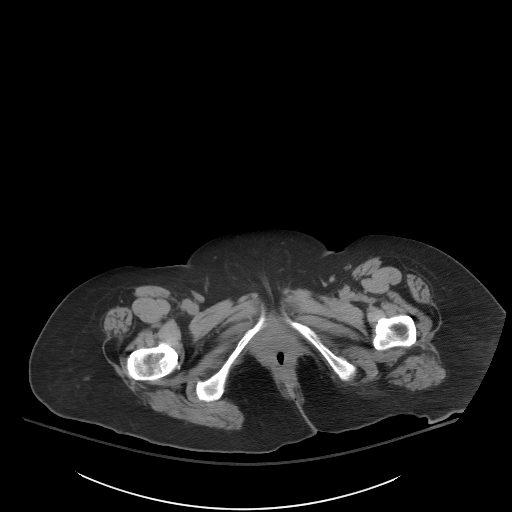
[im 10/110  bone]
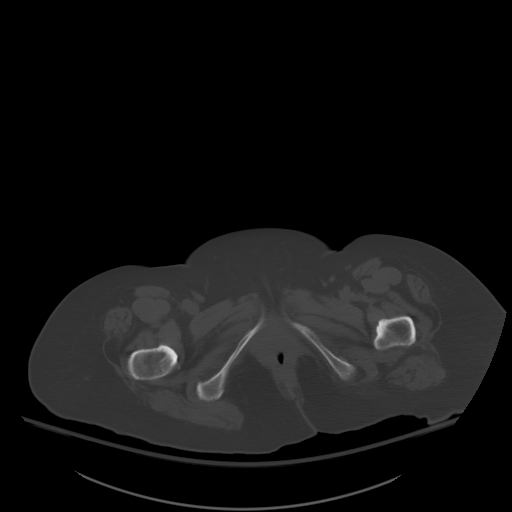
[im 19/110  soft-tissue]
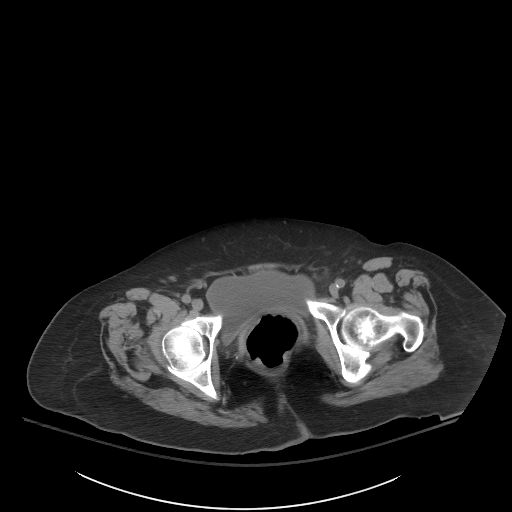
[im 29/110  soft-tissue]
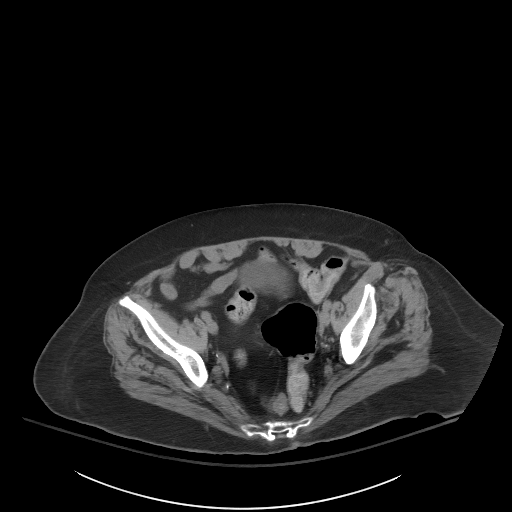
[im 38/110  soft-tissue]
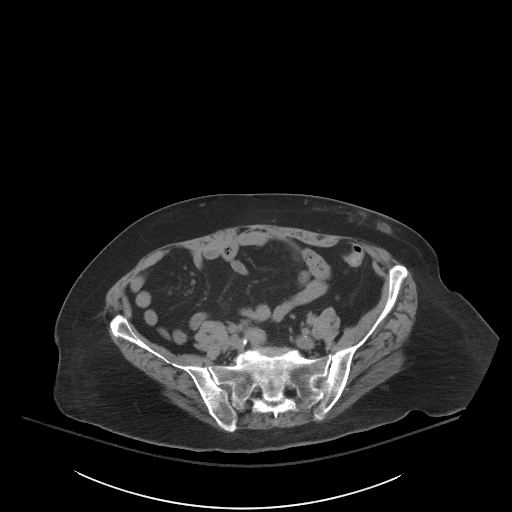
[im 48/110  soft-tissue]
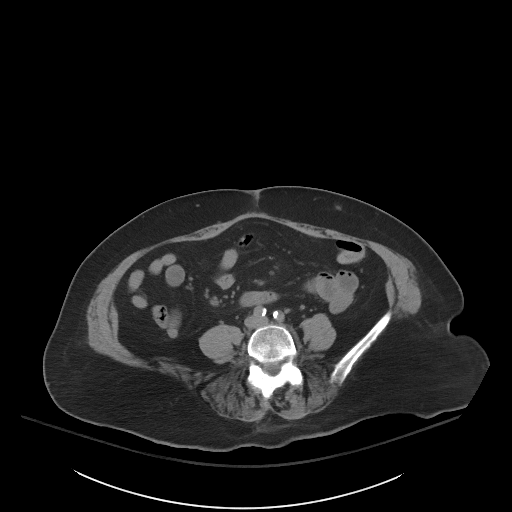
[im 57/110  soft-tissue]
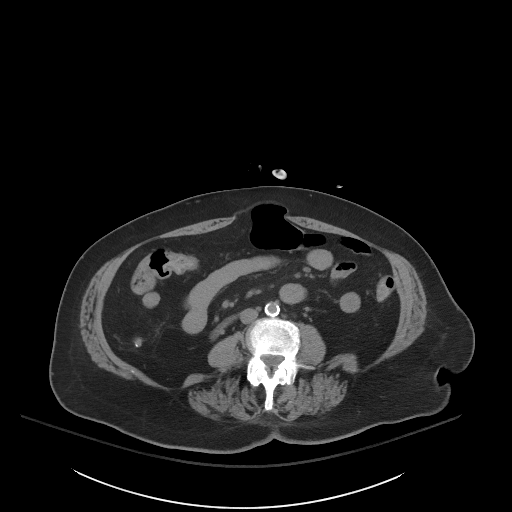
[im 67/110  soft-tissue]
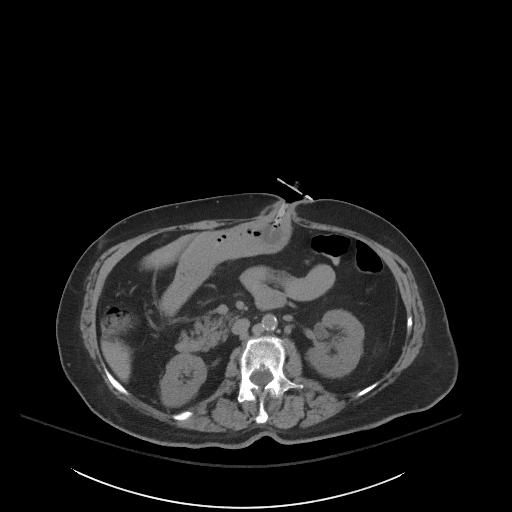
[im 76/110  soft-tissue]
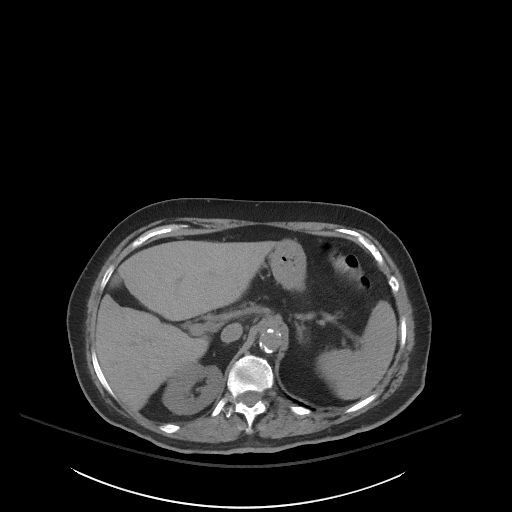
[im 86/110  soft-tissue]
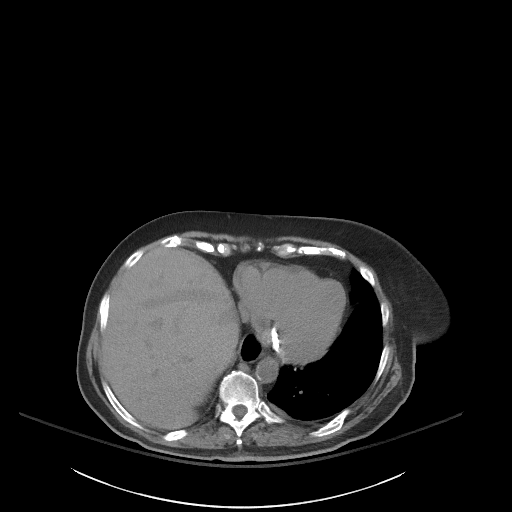
[im 86/110  bone]
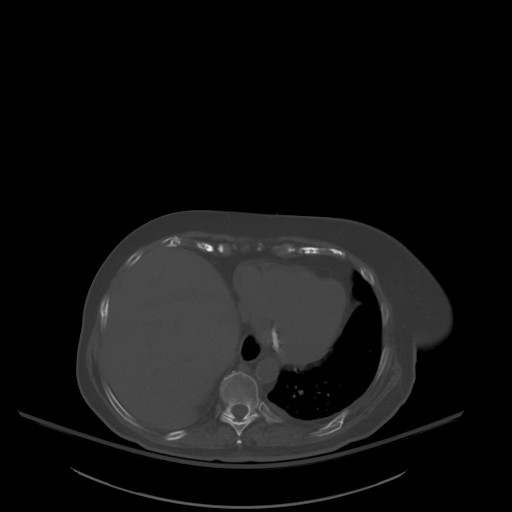
[im 95/110  soft-tissue]
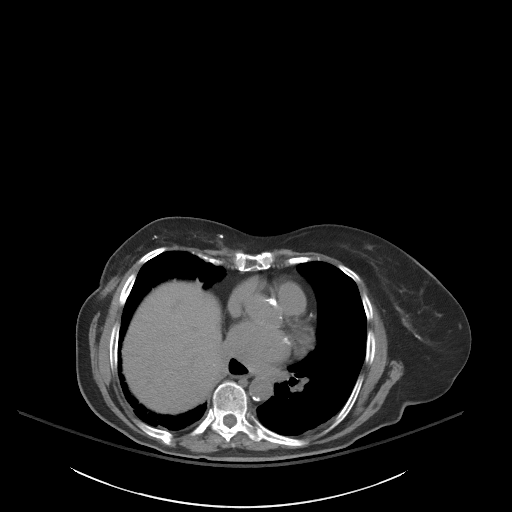
[im 105/110  soft-tissue]
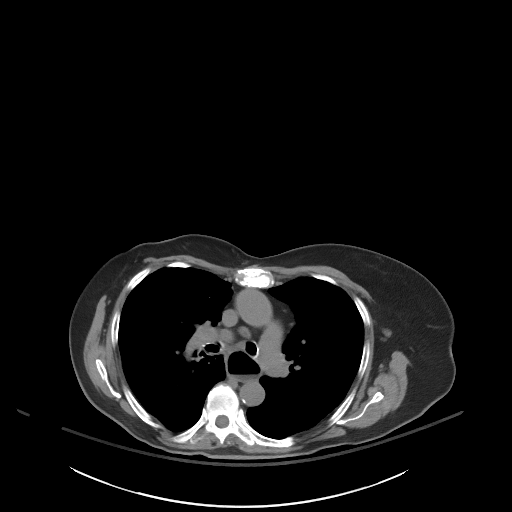

[Series 5: coronal st · coronal · 0.70mm/px · 3 of 95 slices shown]
[im 32/95  soft-tissue]
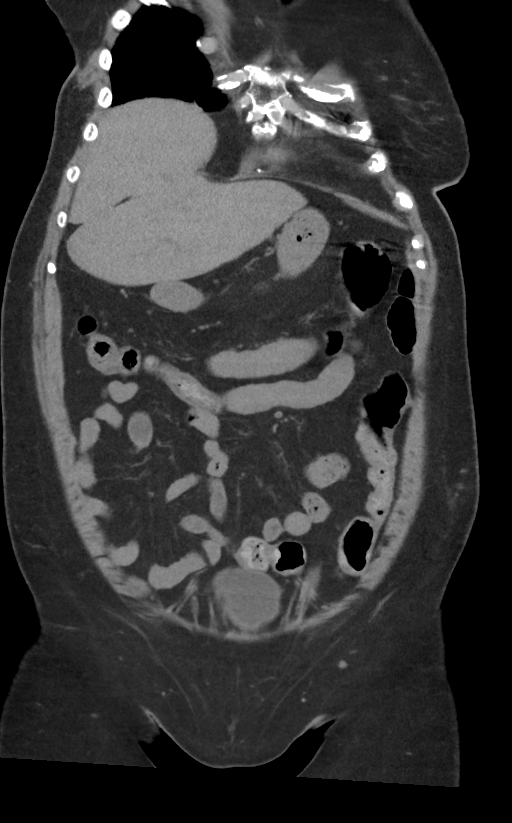
[im 42/95  soft-tissue]
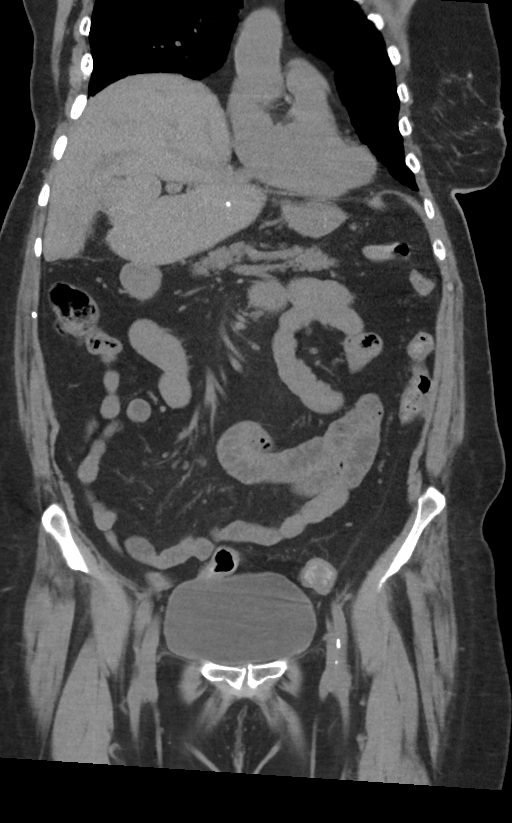
[im 53/95  soft-tissue]
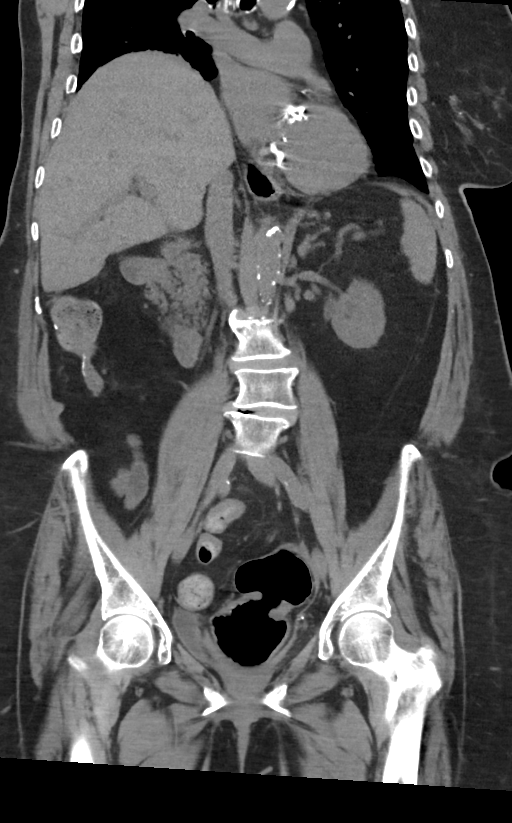

[14 of 46 positions shown; findings below may reference images not displayed]

FINDINGS: Lower chest: Extensive bibasilar nodularity is felt to be similar to
06/26/2021 and most likely post infectious/inflammatory. Normal
heart size without pericardial or pleural effusion. Lad coronary
artery calcification. Right hemidiaphragm elevation. The distal
esophagus is markedly dilated with fluid level within, similar.

Hepatobiliary: Normal liver. Cholecystectomy, without biliary ductal
dilatation.

Pancreas: Normal, without mass or ductal dilatation.

Spleen: Normal in size, without focal abnormality.

Adrenals/Urinary Tract: Normal adrenal glands. No renal calculi or
hydronephrosis. No hydroureter or ureteric calculi. No bladder
calculi.

Stomach/Bowel: The gastric body again extends into an area of
ventral abdominal wall laxity at the site of the gastrostomy tube.
The tube is positioned with the balloon intraluminal. No surrounding
edema or fluid collection. The stomach is underdistended.

A separate area of ventral abdominal wall laxity contains
nonobstructive transverse colon. Surgical changes of partial right
hemicolectomy. Normal small bowel.

Vascular/Lymphatic: Advanced aortic and branch vessel
atherosclerosis. No abdominopelvic adenopathy.

Reproductive: Hysterectomy.  No adnexal mass.

Other: No significant free fluid. Mild pelvic floor laxity. No free
intraperitoneal air.

Musculoskeletal: Osteopenia. Remote fractures of bilateral lower
ribs. Degenerate disc disease at L3-4. T6 mild and borderline T5
mild compression deformities. The T6 compression deformity is
similar and the T5 compression deformity is not included on the
prior exam. S shaped thoracolumbar spine curvature.
IMPRESSION: 1. Gastrostomy tube appropriately positioned with balloon in the
gastric body. The stomach does extend minimally into an area of
ventral abdominal wall laxity in this region.
2.  No acute process in the abdomen or pelvis.
3. Esophageal air fluid level suggests dysmotility or
gastroesophageal reflux.
4. More caudal ventral abdominal wall laxity containing
nonobstructive transverse colon.
5. Aortic Atherosclerosis ([id number]). Coronary artery
atherosclerosis.

## 2023-10-08 ENCOUNTER — Inpatient Hospital Stay
Admission: EM | Admit: 2023-10-08 | Discharge: 2023-10-15 | DRG: 208 | Disposition: A | Discharge status: Home Health | Attending: Internal Medicine | Admitting: Internal Medicine

## 2023-10-08 ENCOUNTER — Emergency Department

## 2023-10-08 ENCOUNTER — Encounter: Payer: Self-pay | Admitting: Emergency Medicine

## 2023-10-08 ENCOUNTER — Other Ambulatory Visit: Payer: Self-pay

## 2023-10-08 DIAGNOSIS — J9600 Acute respiratory failure, unspecified whether with hypoxia or hypercapnia: Secondary | ICD-10-CM | POA: Diagnosis present

## 2023-10-08 DIAGNOSIS — K219 Gastro-esophageal reflux disease without esophagitis: Secondary | ICD-10-CM | POA: Diagnosis present

## 2023-10-08 DIAGNOSIS — I5033 Acute on chronic diastolic (congestive) heart failure: Secondary | ICD-10-CM | POA: Diagnosis present

## 2023-10-08 DIAGNOSIS — N390 Urinary tract infection, site not specified: Secondary | ICD-10-CM | POA: Diagnosis present

## 2023-10-08 DIAGNOSIS — Z91048 Other nonmedicinal substance allergy status: Secondary | ICD-10-CM

## 2023-10-08 DIAGNOSIS — K439 Ventral hernia without obstruction or gangrene: Secondary | ICD-10-CM | POA: Diagnosis present

## 2023-10-08 DIAGNOSIS — R0902 Hypoxemia: Secondary | ICD-10-CM | POA: Diagnosis present

## 2023-10-08 DIAGNOSIS — Z808 Family history of malignant neoplasm of other organs or systems: Secondary | ICD-10-CM

## 2023-10-08 DIAGNOSIS — Z882 Allergy status to sulfonamides status: Secondary | ICD-10-CM

## 2023-10-08 DIAGNOSIS — J383 Other diseases of vocal cords: Secondary | ICD-10-CM | POA: Diagnosis present

## 2023-10-08 DIAGNOSIS — I110 Hypertensive heart disease with heart failure: Secondary | ICD-10-CM | POA: Diagnosis present

## 2023-10-08 DIAGNOSIS — E1142 Type 2 diabetes mellitus with diabetic polyneuropathy: Secondary | ICD-10-CM | POA: Diagnosis present

## 2023-10-08 DIAGNOSIS — Z91013 Allergy to seafood: Secondary | ICD-10-CM | POA: Diagnosis not present

## 2023-10-08 DIAGNOSIS — H409 Unspecified glaucoma: Secondary | ICD-10-CM | POA: Diagnosis present

## 2023-10-08 DIAGNOSIS — Z853 Personal history of malignant neoplasm of breast: Secondary | ICD-10-CM | POA: Diagnosis not present

## 2023-10-08 DIAGNOSIS — J690 Pneumonitis due to inhalation of food and vomit: Secondary | ICD-10-CM | POA: Diagnosis present

## 2023-10-08 DIAGNOSIS — Z794 Long term (current) use of insulin: Secondary | ICD-10-CM

## 2023-10-08 DIAGNOSIS — E875 Hyperkalemia: Secondary | ICD-10-CM | POA: Diagnosis present

## 2023-10-08 DIAGNOSIS — Z9981 Dependence on supplemental oxygen: Secondary | ICD-10-CM | POA: Diagnosis not present

## 2023-10-08 DIAGNOSIS — K9423 Gastrostomy malfunction: Secondary | ICD-10-CM | POA: Diagnosis present

## 2023-10-08 DIAGNOSIS — J9621 Acute and chronic respiratory failure with hypoxia: Principal | ICD-10-CM | POA: Diagnosis present

## 2023-10-08 DIAGNOSIS — E1165 Type 2 diabetes mellitus with hyperglycemia: Secondary | ICD-10-CM | POA: Diagnosis present

## 2023-10-08 DIAGNOSIS — Z930 Tracheostomy status: Secondary | ICD-10-CM | POA: Diagnosis not present

## 2023-10-08 DIAGNOSIS — D72829 Elevated white blood cell count, unspecified: Secondary | ICD-10-CM

## 2023-10-08 DIAGNOSIS — E7800 Pure hypercholesterolemia, unspecified: Secondary | ICD-10-CM | POA: Diagnosis present

## 2023-10-08 DIAGNOSIS — I272 Pulmonary hypertension, unspecified: Secondary | ICD-10-CM | POA: Diagnosis present

## 2023-10-08 DIAGNOSIS — J8410 Pulmonary fibrosis, unspecified: Secondary | ICD-10-CM | POA: Diagnosis present

## 2023-10-08 DIAGNOSIS — Z9221 Personal history of antineoplastic chemotherapy: Secondary | ICD-10-CM | POA: Diagnosis not present

## 2023-10-08 DIAGNOSIS — I5031 Acute diastolic (congestive) heart failure: Secondary | ICD-10-CM | POA: Diagnosis not present

## 2023-10-08 DIAGNOSIS — Z860100 Personal history of colon polyps, unspecified: Secondary | ICD-10-CM

## 2023-10-08 DIAGNOSIS — Z809 Family history of malignant neoplasm, unspecified: Secondary | ICD-10-CM

## 2023-10-08 DIAGNOSIS — Z7983 Long term (current) use of bisphosphonates: Secondary | ICD-10-CM

## 2023-10-08 DIAGNOSIS — R0602 Shortness of breath: Principal | ICD-10-CM

## 2023-10-08 DIAGNOSIS — Z79818 Long term (current) use of other agents affecting estrogen receptors and estrogen levels: Secondary | ICD-10-CM

## 2023-10-08 DIAGNOSIS — Z807 Family history of other malignant neoplasms of lymphoid, hematopoietic and related tissues: Secondary | ICD-10-CM

## 2023-10-08 DIAGNOSIS — Z923 Personal history of irradiation: Secondary | ICD-10-CM

## 2023-10-08 DIAGNOSIS — Z79899 Other long term (current) drug therapy: Secondary | ICD-10-CM

## 2023-10-08 DIAGNOSIS — Z888 Allergy status to other drugs, medicaments and biological substances status: Secondary | ICD-10-CM

## 2023-10-08 LAB — RESPIRATORY PANEL BY PCR

## 2023-10-08 LAB — CBC WITH DIFFERENTIAL/PLATELET
Abs Immature Granulocytes: 0.29 K/uL — ABNORMAL HIGH (ref 0.00–0.07)
Basophils Absolute: 0.1 K/uL (ref 0.0–0.1)
Basophils Relative: 0 %
Eosinophils Absolute: 0.1 K/uL (ref 0.0–0.5)
Eosinophils Relative: 1 %
HCT: 42.8 % (ref 36.0–46.0)
Hemoglobin: 13.8 g/dL (ref 12.0–15.0)
Immature Granulocytes: 2 %
Lymphocytes Relative: 11 %
Lymphs Abs: 2 K/uL (ref 0.7–4.0)
MCH: 28.3 pg (ref 26.0–34.0)
MCHC: 32.2 g/dL (ref 30.0–36.0)
MCV: 87.9 fL (ref 80.0–100.0)
Monocytes Absolute: 0.8 K/uL (ref 0.1–1.0)
Monocytes Relative: 4 %
Neutro Abs: 15 K/uL — ABNORMAL HIGH (ref 1.7–7.7)
Neutrophils Relative %: 82 %
Platelets: 213 K/uL (ref 150–400)
RBC: 4.87 MIL/uL (ref 3.87–5.11)
RDW: 13 % (ref 11.5–15.5)
WBC: 18.3 K/uL — ABNORMAL HIGH (ref 4.0–10.5)
nRBC: 0 % (ref 0.0–0.2)

## 2023-10-08 LAB — MRSA NEXT GEN BY PCR, NASAL: MRSA by PCR Next Gen: NOT DETECTED

## 2023-10-08 LAB — COMPREHENSIVE METABOLIC PANEL WITH GFR
ALT: 37 U/L (ref 0–44)
AST: 72 U/L — ABNORMAL HIGH (ref 15–41)
Albumin: 3.4 g/dL — ABNORMAL LOW (ref 3.5–5.0)
Alkaline Phosphatase: 85 U/L (ref 38–126)
Anion gap: 12 (ref 5–15)
BUN: 26 mg/dL — ABNORMAL HIGH (ref 8–23)
CO2: 22 mmol/L (ref 22–32)
Calcium: 9.1 mg/dL (ref 8.9–10.3)
Chloride: 101 mmol/L (ref 98–111)
Creatinine, Ser: 1.11 mg/dL — ABNORMAL HIGH (ref 0.44–1.00)
GFR, Estimated: 50 mL/min — ABNORMAL LOW (ref >60)
Glucose, Bld: 281 mg/dL — ABNORMAL HIGH (ref 70–99)
Potassium: 5.2 mmol/L — ABNORMAL HIGH (ref 3.5–5.1)
Sodium: 135 mmol/L (ref 135–145)
Total Bilirubin: 1 mg/dL (ref 0.0–1.2)
Total Protein: 7.8 g/dL (ref 6.5–8.1)

## 2023-10-08 LAB — MAGNESIUM: Magnesium: 2 mg/dL (ref 1.7–2.4)

## 2023-10-08 LAB — URINALYSIS, ROUTINE W REFLEX MICROSCOPIC
Bilirubin Urine: NEGATIVE
Glucose, UA: NEGATIVE mg/dL
Hgb urine dipstick: NEGATIVE
Ketones, ur: NEGATIVE mg/dL
Nitrite: NEGATIVE
Protein, ur: NEGATIVE mg/dL
Specific Gravity, Urine: 1.019 (ref 1.005–1.030)
WBC, UA: >50 WBC/hpf (ref 0–5)
pH: 5 (ref 5.0–8.0)

## 2023-10-08 LAB — LACTIC ACID, PLASMA
Lactic Acid, Venous: 2.1 mmol/L (ref 0.5–1.9)
Lactic Acid, Venous: 1.1 mmol/L (ref 0.5–1.9)

## 2023-10-08 LAB — TROPONIN I (HIGH SENSITIVITY)
Troponin I (High Sensitivity): 14 ng/L (ref <18)
Troponin I (High Sensitivity): 23 ng/L — ABNORMAL HIGH (ref <18)

## 2023-10-08 LAB — GLUCOSE, CAPILLARY
Glucose-Capillary: 168 mg/dL — ABNORMAL HIGH (ref 70–99)
Glucose-Capillary: 118 mg/dL — ABNORMAL HIGH (ref 70–99)
Glucose-Capillary: 190 mg/dL — ABNORMAL HIGH (ref 70–99)

## 2023-10-08 LAB — PROCALCITONIN: Procalcitonin: <0.1 ng/mL

## 2023-10-08 LAB — HEMOGLOBIN A1C
Hgb A1c MFr Bld: 7.6 % — ABNORMAL HIGH (ref 4.8–5.6)
Mean Plasma Glucose: 171 mg/dL

## 2023-10-08 MED ORDER — DIPHENHYDRAMINE HCL 25 MG PO CAPS
25.0000 mg | ORAL_CAPSULE | Freq: Four times a day (QID) | ORAL | Status: DC | PRN
  Administered 2023-10-09 – 2023-10-13 (×5): 25 mg via ORAL
  Filled 2023-10-08 (×7): qty 1

## 2023-10-08 MED ORDER — CHLORHEXIDINE GLUCONATE CLOTH 2 % EX PADS
6.0000 | MEDICATED_PAD | Freq: Every day | CUTANEOUS | Status: DC
  Administered 2023-10-08 – 2023-10-15 (×7): 6 via TOPICAL

## 2023-10-08 MED ORDER — HYALURONIDASE HUMAN 150 UNIT/ML IJ SOLN
150.0000 [IU] | Freq: Once | INTRAMUSCULAR | Status: AC
  Administered 2023-10-08 (×2): 150 [IU] via SUBCUTANEOUS
  Filled 2023-10-08: qty 1

## 2023-10-08 MED ORDER — ONDANSETRON HCL 4 MG PO TABS: 4.0000 mg | ORAL_TABLET | Freq: Four times a day (QID) | ORAL | Status: DC | PRN

## 2023-10-08 MED ORDER — INSULIN GLARGINE-YFGN 100 UNIT/ML ~~LOC~~ SOLN
10.0000 [IU] | Freq: Every day | SUBCUTANEOUS | Status: DC
  Administered 2023-10-08 – 2023-10-09 (×4): 10 [IU] via SUBCUTANEOUS
  Filled 2023-10-08 (×3): qty 0.1

## 2023-10-08 MED ORDER — ORAL CARE MOUTH RINSE
15.0000 mL | OROMUCOSAL | Status: DC
  Administered 2023-10-08 – 2023-10-15 (×25): 15 mL via OROMUCOSAL

## 2023-10-08 MED ORDER — KATE FARMS STANDARD 1.4 PO LIQD
1440.0000 mL | Freq: Every day | ORAL | Status: DC
  Filled 2023-10-08: qty 1625

## 2023-10-08 MED ORDER — ORAL CARE MOUTH RINSE: 15.0000 mL | OROMUCOSAL | Status: DC | PRN

## 2023-10-08 MED ORDER — VANCOMYCIN HCL 1500 MG/300ML IV SOLN
1500.0000 mg | Freq: Once | INTRAVENOUS | Status: AC
  Administered 2023-10-08 (×2): 1500 mg via INTRAVENOUS
  Filled 2023-10-08: qty 300

## 2023-10-08 MED ORDER — FUROSEMIDE 10 MG/ML IJ SOLN
20.0000 mg | Freq: Once | INTRAMUSCULAR | Status: AC
  Administered 2023-10-08 (×2): 20 mg via INTRAVENOUS
  Filled 2023-10-08: qty 4

## 2023-10-08 MED ORDER — MELATONIN 5 MG PO TABS
5.0000 mg | ORAL_TABLET | Freq: Every day | ORAL | Status: DC
  Administered 2023-10-08 – 2023-10-14 (×8): 5 mg
  Filled 2023-10-08 (×6): qty 1

## 2023-10-08 MED ORDER — METOPROLOL TARTRATE 25 MG PO TABS
12.5000 mg | ORAL_TABLET | Freq: Two times a day (BID) | ORAL | Status: DC
  Administered 2023-10-08 – 2023-10-15 (×17): 12.5 mg
  Filled 2023-10-08 (×13): qty 1

## 2023-10-08 MED ORDER — MIRABEGRON ER 25 MG PO TB24
25.0000 mg | ORAL_TABLET | Freq: Every day | ORAL | Status: DC
  Administered 2023-10-12 – 2023-10-15 (×3): 25 mg via ORAL
  Filled 2023-10-08 (×8): qty 1

## 2023-10-08 MED ORDER — ONDANSETRON HCL 4 MG/2ML IJ SOLN: 4.0000 mg | Freq: Four times a day (QID) | INTRAMUSCULAR | Status: DC | PRN

## 2023-10-08 MED ORDER — SODIUM CHLORIDE 0.9 % IV SOLN: 100.0000 mg | Freq: Two times a day (BID) | INTRAVENOUS | Status: DC

## 2023-10-08 MED ORDER — IPRATROPIUM-ALBUTEROL 0.5-2.5 (3) MG/3ML IN SOLN: 3.0000 mL | Freq: Four times a day (QID) | RESPIRATORY_TRACT | Status: DC | PRN

## 2023-10-08 MED ORDER — BUPROPION HCL 75 MG PO TABS
150.0000 mg | ORAL_TABLET | Freq: Two times a day (BID) | ORAL | Status: DC
  Administered 2023-10-08 – 2023-10-15 (×15): 150 mg
  Filled 2023-10-08 (×15): qty 2

## 2023-10-08 MED ORDER — ENOXAPARIN SODIUM 40 MG/0.4ML IJ SOSY
40.0000 mg | PREFILLED_SYRINGE | INTRAMUSCULAR | Status: DC
  Administered 2023-10-08 – 2023-10-14 (×9): 40 mg via SUBCUTANEOUS
  Filled 2023-10-08 (×7): qty 0.4

## 2023-10-08 MED ORDER — ESCITALOPRAM OXALATE 10 MG PO TABS
20.0000 mg | ORAL_TABLET | Freq: Every day | ORAL | Status: DC
  Administered 2023-10-08 – 2023-10-13 (×8): 20 mg via ORAL
  Filled 2023-10-08 (×8): qty 2

## 2023-10-08 MED ORDER — KATE FARMS STANDARD 1.4 PO LIQD
1000.0000 mL | ORAL | Status: DC
  Filled 2023-10-08: qty 1300

## 2023-10-08 MED ORDER — ACETAMINOPHEN 325 MG PO TABS
650.0000 mg | ORAL_TABLET | Freq: Four times a day (QID) | ORAL | Status: DC | PRN
  Administered 2023-10-10 – 2023-10-14 (×2): 650 mg
  Filled 2023-10-08 (×2): qty 2

## 2023-10-08 MED ORDER — LORATADINE 10 MG PO TABS
10.0000 mg | ORAL_TABLET | Freq: Every day | ORAL | Status: DC
  Administered 2023-10-09 – 2023-10-15 (×6): 10 mg
  Filled 2023-10-08 (×5): qty 1

## 2023-10-08 MED ORDER — KATE FARMS STANDARD 1.4 EN LIQD
1000.0000 mL | ENTERAL | Status: DC
  Administered 2023-10-08 – 2023-10-14 (×6): 1000 mL
  Filled 2023-10-08 (×10): qty 1000

## 2023-10-08 MED ORDER — SODIUM CHLORIDE 3 % IN NEBU
4.0000 mL | INHALATION_SOLUTION | Freq: Two times a day (BID) | RESPIRATORY_TRACT | Status: AC
  Administered 2023-10-08 – 2023-10-10 (×8): 4 mL via RESPIRATORY_TRACT
  Filled 2023-10-08 (×5): qty 4

## 2023-10-08 MED ORDER — ROPINIROLE HCL 0.25 MG PO TABS
0.5000 mg | ORAL_TABLET | Freq: Every day | ORAL | Status: DC
  Administered 2023-10-08 – 2023-10-14 (×8): 0.5 mg via ORAL
  Filled 2023-10-08 (×7): qty 2

## 2023-10-08 MED ORDER — PIPERACILLIN-TAZOBACTAM 3.375 G IVPB 30 MIN
3.3750 g | Freq: Once | INTRAVENOUS | Status: AC
  Administered 2023-10-08 (×2): 3.375 g via INTRAVENOUS
  Filled 2023-10-08 (×2): qty 50

## 2023-10-08 MED ORDER — INSULIN ASPART 100 UNIT/ML IJ SOLN
0.0000 [IU] | Freq: Every day | INTRAMUSCULAR | Status: DC
  Administered 2023-10-09 – 2023-10-14 (×3): 2 [IU] via SUBCUTANEOUS
  Filled 2023-10-08 (×2): qty 1

## 2023-10-08 MED ORDER — INSULIN ASPART 100 UNIT/ML IJ SOLN
0.0000 [IU] | Freq: Three times a day (TID) | INTRAMUSCULAR | Status: DC
  Administered 2023-10-09: 3 [IU] via SUBCUTANEOUS
  Administered 2023-10-09: 5 [IU] via SUBCUTANEOUS
  Administered 2023-10-09: 3 [IU] via SUBCUTANEOUS
  Administered 2023-10-09 – 2023-10-10 (×2): 5 [IU] via SUBCUTANEOUS
  Administered 2023-10-10: 11 [IU] via SUBCUTANEOUS
  Administered 2023-10-10 – 2023-10-12 (×4): 3 [IU] via SUBCUTANEOUS
  Administered 2023-10-12: 5 [IU] via SUBCUTANEOUS
  Administered 2023-10-12: 8 [IU] via SUBCUTANEOUS
  Administered 2023-10-13 (×2): 3 [IU] via SUBCUTANEOUS
  Administered 2023-10-13: 5 [IU] via SUBCUTANEOUS
  Administered 2023-10-14: 2 [IU] via SUBCUTANEOUS
  Administered 2023-10-14 – 2023-10-15 (×3): 3 [IU] via SUBCUTANEOUS
  Filled 2023-10-08 (×17): qty 1

## 2023-10-08 MED ORDER — IOHEXOL 350 MG/ML SOLN
100.0000 mL | Freq: Once | INTRAVENOUS | Status: AC | PRN
  Administered 2023-10-08 (×2): 100 mL via INTRAVENOUS

## 2023-10-08 MED ORDER — ZINC OXIDE 40 % EX OINT
TOPICAL_OINTMENT | Freq: Three times a day (TID) | CUTANEOUS | Status: DC | PRN
  Filled 2023-10-08: qty 113

## 2023-10-08 MED ORDER — BUPROPION HCL ER (SR) 150 MG PO TB12
150.0000 mg | ORAL_TABLET | Freq: Two times a day (BID) | ORAL | Status: DC
  Filled 2023-10-08: qty 1

## 2023-10-08 MED ORDER — PANTOPRAZOLE SODIUM 40 MG PO TBEC
40.0000 mg | DELAYED_RELEASE_TABLET | Freq: Every day | ORAL | Status: DC
  Filled 2023-10-08: qty 1

## 2023-10-08 MED ORDER — SODIUM CHLORIDE 0.9 % IV BOLUS
1000.0000 mL | Freq: Once | INTRAVENOUS | Status: AC
  Administered 2023-10-08 (×2): 1000 mL via INTRAVENOUS

## 2023-10-08 MED ORDER — SODIUM CHLORIDE 0.9 % IV SOLN
1.0000 g | INTRAVENOUS | Status: AC
  Administered 2023-10-08 – 2023-10-12 (×7): 1 g via INTRAVENOUS
  Filled 2023-10-08 (×5): qty 10

## 2023-10-08 MED ORDER — EZETIMIBE 10 MG PO TABS
10.0000 mg | ORAL_TABLET | Freq: Every day | ORAL | Status: DC
  Administered 2023-10-08 – 2023-10-13 (×7): 10 mg via ORAL
  Filled 2023-10-08 (×4): qty 1

## 2023-10-08 MED ORDER — PANTOPRAZOLE SODIUM 40 MG IV SOLR
40.0000 mg | Freq: Every day | INTRAVENOUS | Status: AC
  Administered 2023-10-08 – 2023-10-14 (×9): 40 mg via INTRAVENOUS
  Filled 2023-10-08 (×7): qty 10

## 2023-10-08 MED ORDER — PRAVASTATIN SODIUM 20 MG PO TABS
20.0000 mg | ORAL_TABLET | Freq: Every day | ORAL | Status: DC
  Administered 2023-10-08 – 2023-10-13 (×8): 20 mg via ORAL
  Filled 2023-10-08 (×7): qty 1

## 2023-10-08 MED ORDER — HYDROXYZINE HCL 25 MG PO TABS: 25.0000 mg | ORAL_TABLET | Freq: Two times a day (BID) | ORAL | Status: DC

## 2023-10-08 MED ORDER — FREE WATER
50.0000 mL | Status: DC
  Administered 2023-10-08 – 2023-10-15 (×38): 50 mL

## 2023-10-08 MED ORDER — MECLIZINE HCL 25 MG PO TABS: 12.5000 mg | ORAL_TABLET | Freq: Three times a day (TID) | ORAL | Status: DC | PRN

## 2023-10-08 MED ORDER — POLYVINYL ALCOHOL 1.4 % OP SOLN: 1.0000 [drp] | Freq: Two times a day (BID) | OPHTHALMIC | Status: DC | PRN

## 2023-10-08 MED ORDER — GABAPENTIN 300 MG PO CAPS
300.0000 mg | ORAL_CAPSULE | Freq: Three times a day (TID) | ORAL | Status: DC
  Administered 2023-10-08 – 2023-10-10 (×12): 300 mg
  Filled 2023-10-08 (×7): qty 1

## 2023-10-08 NOTE — ED Triage Notes (Signed)
 Pt arrived via [facility] from home with c/o increased SOB over last hour. Pt has trach in place. EMS reports pt normally on room air during day and vent at night. Tonight pt was on vent when she began to have difficulty breathing. Husband attempted to suction secretion but attempts did not help. Per EMS, pt was 75% on room air when they removed vent and transported her to ambulance. Pt arrived to ED on 8L oxygen by trach.

## 2023-10-08 NOTE — H&P (Addendum)
 History and Physical    Angela David FMW:[id number] DOB: August 07, 1940 DOA: 10/08/2023  PCP: [name], MD (Confirm with patient/family/NH records and if not entered, this has to be entered at [facility] point of entry) Patient coming from: SNF  I have personally briefly reviewed patient's old medical records in [facility]  Chief Complaint: SOB  HPI: Angela David is a 83 y.o. 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  Patient was feeling fine yesterday [name], she woke up at night complaining about  cannot breathe, denied any fever or chills no cough no chest pains.  At baseline she breathes through tracheostomy on room air and connected to ventilator at night.  She uses walker and wheelchair to ambulate but never experienced any exertional dyspnea or orthopnea before.  Denied any nauseous vomiting, she uses most feeding only for pleasure, and she has been stable on PEG tube feeding.  ED Course: Afebrile, tachycardia tachypneic O2 saturation 75%, on room air and patient was put on nonrebreather mask O2 saturation improved to 100%.  Overnight ED physician and staff did multiple suctioning with copious amount of clear phlegm and O2 saturation maintained well.  Chest x-ray showed mild interstitial edema, CTA showed no PE, signs of small airway disease and diffuse bronchial wall thickening with increased soft tissue within peribronchial vascular interstitium, chronic fibrosis.  Blood work showed WBC 18.3 hemoglobin 13.8 glucose 281 BUN 26 creatinine 1.1 bicarb 22K 5.2  Patient was given vancomycin  and Zosyn  in the ED  Review of Systems: As per HPI otherwise  14 point review of systems negative.    Past Medical History:  Diagnosis Date   Breast cancer, right (HCC) 2002   Tx'd with chemotherapy and XRT   Chronic respiratory failure (HCC)    s/p tracheostomy in 2012; on ventilatory support at hs   Diabetic peripheral neuropathy (HCC) 08/28/2016   GERD (gastroesophageal reflux disease)    Glaucoma    High cholesterol    History of colon polyps    Osteoarthritis    Personal history of chemotherapy 2002   BREAST CA   Personal history of radiation therapy 2002   BREAST CA   S/P percutaneous endoscopic gastrostomy (PEG) tube placement (HCC)    T2DM (type 2 diabetes mellitus) (HCC)    Tracheostomy dependent (HCC) 2012   Ventral hernia    Vocal cord paralysis 2012    Past Surgical History:  Procedure Laterality Date   ABDOMINAL HYSTERECTOMY     BREAST BIOPSY Right 2008   neg   BREAST BIOPSY Right 06/30/2020   Q clip-path pending   BREAST EXCISIONAL BIOPSY Right 2002   positive   BREAST LUMPECTOMY Right 2002   chemo and radiation   BREAST LUMPECTOMY WITH RADIOFREQUENCY TAG IDENTIFICATION Right 10/18/2020   Procedure: BREAST LUMPECTOMY WITH RADIOFREQUENCY TAG IDENTIFICATION;  Surgeon: Jordis Laneta FALCON, MD;  Location: [facility];  Service: General;  Laterality: Right;   BREAST SURGERY Right 2002   LUMPECTOMY, radiation, chemotherapy   CATARACT EXTRACTION, BILATERAL     CHOLECYSTECTOMY N/A 12/10/2017   Procedure: LAPAROSCOPIC CHOLECYSTECTOMY;  Surgeon: Tye Millet, DO;  Location: [facility] OR[facility];  Service: General;  Laterality: N/A;   COLON RESECTION Right 11/06/2018   Procedure: HAND ASSISTED LAPAROSCOPIC  COLON RESECTION, RIGHT;  Surgeon: Jordis Laneta FALCON, MD;  Location: [facility];  Service: General;  Laterality: Right;   COLONOSCOPY  2009,12/30/2013   COLONOSCOPY WITH PROPOFOL  N/A 04/16/2018   Tubulovillous polyp proximal transverse colon, large sessile polyp, proximal transverse colon;  Surgeon: Dessa Reyes ORN, MD;  Location: [facility];   Service: Endoscopy;  Laterality: N/A;   ESOPHAGOGASTRODUODENOSCOPY (EGD) WITH PROPOFOL  N/A 10/14/2018   Procedure: ESOPHAGOGASTRODUODENOSCOPY (EGD) WITH PROPOFOL ;  Surgeon: Jinny Carmine, MD;  Location: [facility];  Service: Endoscopy;  Laterality: N/A;   IR GASTR TUBE CONVERT GASTR-JEJ PER W/FL MOD SED  01/10/2022   [name] TUBE CHANGE  07/30/2022   IR REPLACE G-TUBE SIMPLE WO FLUORO  11/29/2021   IR REPLC GASTRO/COLONIC TUBE PERCUT W/FLUORO  11/24/2021   IR REPLC GASTRO/COLONIC TUBE PERCUT W/FLUORO  05/02/2023   TRACHEAL SURGERY  2013   TRACHEOSTOMY N/A    Dr Edda   XI ROBOTIC ASSISTED VENTRAL HERNIA N/A 10/18/2020   Procedure: XI ROBOTIC ASSISTED VENTRAL HERNIA;  Surgeon: Jordis Laneta FALCON, MD;  Location: [facility];  Service: General;  Laterality: N/A;     reports that she has never smoked. She has been exposed to tobacco smoke. She has never used smokeless tobacco. She reports that she does not drink alcohol  and does not use drugs.  Allergies  Allergen Reactions   Shellfish Allergy Shortness Of Breath    respitatory distress    Sodium Sulfite Shortness Of Breath    Severe Congestion that creates inability to breath   Sulfa Antibiotics Shortness Of Breath and Other (See Comments)    respitatory distress   Codeine Other (See Comments)    Per patient it kept her up all night   Glucerna [Alimentum] Itching   Wound Dressing Adhesive Rash    Family History  Problem Relation Age of Onset   Cancer Father        bone cancer   Cancer Sister        lymphoma   Cancer Brother    Breast cancer Neg Hx      Prior to Admission medications   Medication Sig Start Date End Date Taking? Authorizing Provider  acetaminophen  (TYLENOL ) 325 MG tablet Place 2 tablets (650 mg total) into feeding tube every 6 (six) hours as needed for fever. 03/06/21   Shellia Inge BIRCH, NP  buPROPion  (WELLBUTRIN  SR) 150 MG 12 hr tablet Take 150 mg by mouth 2 (two) times daily. 11/13/21   [provider]   Continuous Blood Gluc Sensor (FREESTYLE LIBRE 2 SENSOR) MISC 1 Device by Does not apply route daily. 07/06/21   Keene, Jeremiah D, NP  denosumab  (PROLIA ) 60 MG/ML SOSY injection Inject 1 mL by subcutaneous route.    [provider]  diphenhydrAMINE  (BENADRYL ) 25 mg capsule Take 1 capsule (25 mg total) by mouth every 6 (six) hours as needed for allergies or itching. 03/06/21   Shellia Inge BIRCH, NP  EPINEPHrine  0.3 mg/0.3 mL IJ SOAJ injection Inject 0.3 mLs (0.3 mg dose) into the muscle once as needed for Anaphylaxis for up to 1 dose. 09/10/18   [provider]  escitalopram  (LEXAPRO ) 20 MG tablet TAKE 1 TABLET VIA TUBE DAILY. TAKE ALONG WITH 10 MG DOSE. 11/13/21   [provider]  estradiol  (ESTRACE ) 0.1 MG/GM vaginal cream Insert 1 applicatorful twice a week by vaginal route for 30 days.    [provider]  ezetimibe  (ZETIA ) 10 MG tablet Take 10 mg by mouth daily. 11/20/21   [provider]  folic acid  (FOLVITE ) 1 MG tablet Take 1 tablet by mouth daily. 12/21/19   [provider]  gabapentin  (NEURONTIN ) 300 MG capsule Place 300 mg into feeding tube every 8 (eight) hours.    [provider]  HUMALOG KWIKPEN 100 UNIT/ML KwikPen Inject into the skin. 11/06/21   [provider]  hydrOXYzine  (ATARAX ) 25 MG tablet Place 25 mg into feeding tube 2 (two) times daily.    [provider]  insulin  glargine (LANTUS ) 100 UNIT/ML Solostar Pen Inject 10 Units into the skin daily. 07/06/21   Shellia Inge BIRCH, NP  Insulin  Pen Needle (PEN NEEDLES 3/16) 31G X 5 MM MISC 1 Container by Does not apply route daily. 07/06/21   Keene, Jeremiah D, NP  ipratropium-albuterol  (DUONEB) 0.5-2.5 (3) MG/3ML SOLN Take 3 mLs by nebulization every 6 (six) hours as needed. 05/09/23   Kasa, Kurian, MD  loratadine  (CLARITIN ) 10 MG tablet Place 1 tablet (10 mg total) into feeding tube daily. 03/07/21   Shellia Inge BIRCH, NP  lovastatin  (MEVACOR ) 20 MG tablet Take 20  mg by mouth at bedtime.    [provider]  meclizine  (ANTIVERT ) 12.5 MG tablet Take 12.5 mg by mouth 3 (three) times daily as needed for dizziness.    [provider]  Melatonin 5 MG CAPS Take by mouth.    [provider]  metoprolol  tartrate (LOPRESSOR ) 25 MG tablet Place 1 tablet (25 mg total) into feeding tube daily. Patient taking differently: Place 12.5 mg into feeding tube 2 (two) times daily. 03/07/21   Keene, Jeremiah D, NP  Multiple Vitamin (MULTIVITAMIN) LIQD Place 15 mLs into feeding tube daily. 03/07/21   Shellia Inge BIRCH, NP  Nutritional Supplements (FEEDING SUPPLEMENT, KATE FARMS STANDARD 1.4,) LIQD liquid Place 1,440 mLs into feeding tube daily. 03/07/21   Shellia Inge BIRCH, NP  pantoprazole  (PROTONIX ) 40 MG tablet Take 40 mg by mouth daily. 11/10/21   [provider]  Propylene Glycol (SYSTANE BALANCE) 0.6 % SOLN Place 1 drop into both eyes 2 (two) times daily.    [provider]  rOPINIRole  (REQUIP ) 0.5 MG tablet Take 0.5 mg by mouth at bedtime. 10/27/21   [provider]  sodium chloride  HYPERTONIC 3 % nebulizer solution Take 4 mLs by nebulization 2 (two) times daily. 07/05/21   Shellia Inge BIRCH, NP  TOUJEO  SOLOSTAR 300 UNIT/ML Solostar Pen Inject into the skin. 11/06/21   [provider]  Vibegron  (GEMTESA ) 75 MG TABS Take 1 tablet (75 mg total) by mouth daily. 05/24/22   Vaillancourt, Samantha, PA-C  Water  For Irrigation, Sterile (FREE WATER ) SOLN Place 50 mLs into feeding tube every 4 (four) hours. 03/06/21   Shellia Inge BIRCH, NP  Zinc  Oxide (BAZA PROTECT MOISTURE BARRIER) 12 % CREA Apply 1 application twice a day by topical route. 08/08/21   [provider]    Physical Exam: Vitals:   10/08/23 0430 10/08/23 0648 10/08/23 0700 10/08/23 0920  BP:   105/67   Pulse: 88  85 86  Resp: 18  18 20   Temp:  98.7 F (37.1 C)    TempSrc:  Oral    SpO2: 100%  100% 98%  Weight:      Height:        Constitutional:  NAD, calm, comfortable Vitals:   10/08/23 0430 10/08/23 0648 10/08/23 0700 10/08/23 0920  BP:   105/67   Pulse: 88  85 86  Resp: 18  18 20   Temp:  98.7 F (37.1  C)    TempSrc:  Oral    SpO2: 100%  100% 98%  Weight:      Height:       Eyes: PERRL, lids and conjunctivae normal ENMT: Mucous membranes are moist. Posterior pharynx clear of any exudate or lesions.Normal dentition.  Neck: normal, supple, no masses, no thyromegaly Respiratory: clear to auscultation bilaterally, no wheezing, fine crackles.  On bilateral lower fields, increasing respiratory effort. No accessory muscle use.  Cardiovascular: Regular rate and rhythm, systolic murmur on heart base. No extremity edema. 2+ pedal pulses. No carotid bruits.  Abdomen: no tenderness, no masses palpated. No hepatosplenomegaly. Bowel sounds positive.  Musculoskeletal: no clubbing / cyanosis. No joint deformity upper and lower extremities. Good ROM, no contractures. Normal muscle tone.  Skin: no rashes, lesions, ulcers. No induration Neurologic: CN 2-12 grossly intact. Sensation intact, DTR normal. Strength 5/5 in all 4.  Psychiatric: Normal judgment and insight. Alert and oriented x 3. Normal mood.     Labs on Admission: I have personally reviewed following labs and imaging studies  CBC: Recent Labs  Lab 10/08/23 0039  WBC 18.3*  NEUTROABS 15.0*  HGB 13.8  HCT 42.8  MCV 87.9  PLT 213   Basic Metabolic Panel: Recent Labs  Lab 10/08/23 0039  NA 135  K 5.2*  CL 101  CO2 22  GLUCOSE 281*  BUN 26*  CREATININE 1.11*  CALCIUM  9.1  MG 2.0   GFR: Estimated Creatinine Clearance: 39.8 mL/min (A) (by C-G formula based on SCr of 1.11 mg/dL (H)). Liver Function Tests: Recent Labs  Lab 10/08/23 0039  AST 72*  ALT 37  ALKPHOS 85  BILITOT 1.0  PROT 7.8  ALBUMIN  3.4*   No results for input(s): LIPASE, AMYLASE in the last 168 hours. No results for input(s): AMMONIA in the last 168 hours. Coagulation Profile: No  results for input(s): INR, PROTIME in the last 168 hours. Cardiac Enzymes: No results for input(s): CKTOTAL, CKMB, CKMBINDEX, TROPONINI in the last 168 hours. BNP (last 3 results) No results for input(s): PROBNP in the last 8760 hours. HbA1C: No results for input(s): HGBA1C in the last 72 hours. CBG: No results for input(s): GLUCAP in the last 168 hours. Lipid Profile: No results for input(s): CHOL, HDL, LDLCALC, TRIG, CHOLHDL, LDLDIRECT in the last 72 hours. Thyroid Function Tests: No results for input(s): TSH, T4TOTAL, FREET4, T3FREE, THYROIDAB in the last 72 hours. Anemia Panel: No results for input(s): VITAMINB12, FOLATE, FERRITIN, TIBC, IRON, RETICCTPCT in the last 72 hours. Urine analysis:    Component Value Date/Time   COLORURINE YELLOW (A) 10/08/2023 0831   APPEARANCEUR CLOUDY (A) 10/08/2023 0831   APPEARANCEUR Cloudy (A) 06/25/2022 1528   LABSPEC 1.019 10/08/2023 0831   PHURINE 5.0 10/08/2023 0831   GLUCOSEU NEGATIVE 10/08/2023 0831   HGBUR NEGATIVE 10/08/2023 0831   BILIRUBINUR NEGATIVE 10/08/2023 0831   BILIRUBINUR Negative 06/25/2022 1528   KETONESUR NEGATIVE 10/08/2023 0831   PROTEINUR NEGATIVE 10/08/2023 0831   UROBILINOGEN 2.0 (H) 11/18/2010 1535   NITRITE NEGATIVE 10/08/2023 0831   LEUKOCYTESUR LARGE (A) 10/08/2023 0831    Radiological Exams on Admission: CT Angio Chest PE W and/or Wo Contrast Result Date: 10/08/2023 EXAM: CTA of the Chest with contrast for PE 10/08/2023 03:25:32 AM TECHNIQUE: CTA of the chest was performed after the administration of intravenous contrast. Multiplanar reformatted images are provided for review. MIP images are provided for review. Automated exposure control, iterative reconstruction, and/or weight based adjustment of the mA/kV was utilized to reduce the  radiation dose to as low as reasonably achievable. COMPARISON: 07/13/2022 CLINICAL HISTORY: Hypoxic trach patient. Eval PE, PNA,  mucous plugging. FINDINGS: PULMONARY ARTERIES: There is satisfactory opacification of the pulmonary arteries. No signs of acute pulmonary embolism. MEDIASTINUM: Heart size is normal. No pericardial effusion. Aortic atherosclerosis and coronary artery calcifications. There is a tracheostomy tube in place with the tip above the carina. The esophagus appears diffusely dilated, unchanged from the previous exam. No enlarged mediastinal or hilar lymph nodes. LYMPH NODES: No enlarged mediastinal or hilar lymph nodes. LUNGS AND PLEURA: There is decreased AP diameter of the trachea and central airways compatible with chronic tracheobronchomalacia. Diffuse bronchial wall thickening with increased soft tissue within the central peribronchovascular interstitium. Mosaic attenuation pattern is identified within both lungs. No pneumothorax. Small pleural effusions with bilateral posterior pleural thickening. Patchy ground glass attenuation and mild interstitial thickening noted bilaterally. Chronic fibrosis, architectural distortion, and volume loss are noted within the anterior medial right upper lobe. Bilateral pulmonary nodules are again noted. Index nodule within the right upper lobe measures 0.6 cm, image 30/6. Unchanged from previous exam. Right lower lobe lung nodule measures 6 mm, image 53/6. Unchanged from previous exam. Additional small scattered pulmonary nodules are similar to the previous exam, measuring less than 1 cm. UPPER ABDOMEN: No acute abnormality within the imaged portions of the upper abdomen. SOFT TISSUES AND BONES: Multiple remote left posterior rib fracture deformities. Numerous bilateral chronic healed rib fracture deformities are again noted, similar to the previous exam. No acute osseous abnormality. LIMITATIONS/ARTIFACTS: Study is mildly limited due to respiratory motion artifact. IMPRESSION: 1. No evidence of pulmonary embolism. 2. Bilateral pulmonary nodules, unchanged from previous exam. 3. Small  pleural effusions with bilateral posterior pleural thickening. Mild interstitial thickening and ground-glass attenuation may reflect early pulmonary edema. Correlate for any clinical signs or symptoms of mild chf. 4. Decreased AP diameter of the trachea and central airways compatible with chronic tracheobronchomalacia. 5. Mosaic attenuation pattern noted within both lungs compatible with small airways disease. Additionally, there is diffuse bronchial wall thickening with increased soft tissue within the peribronchovascular interstitium. 6. Chronic fibrosis, architectural distortion, and volume loss in the anterior medial right upper lobe. Unchanged from previous exam. 7. Aortic atherosclerosis and coronary artery calcifications. Electronically signed by: Waddell Calk MD 10/08/2023 06:02 AM EDT RP Workstation: [id number]   DG Chest Portable 1 View Result Date: 10/08/2023 CLINICAL DATA:  Hypoxia EXAM: PORTABLE CHEST 1 VIEW COMPARISON:  07/13/2022 FINDINGS: Tracheostomy in expected position. Stable elevation of right hemidiaphragm. Mild diffuse interstitial pulmonary infiltrate favored represent mild interstitial pulmonary edema. No confluent pulmonary infiltrate. No pneumothorax or pleural effusion. Cardiac size within normal limits. Surgical clips seen within the right axilla. IMPRESSION: 1. Mild interstitial pulmonary edema. Electronically Signed   By: Dorethia Molt M.D.   On: 10/08/2023 02:49    EKG: Independently reviewed.  Afebrile, sinus tachycardia no acute ST changes.  Assessment/Plan Principal Problem:   Hypoxia Active Problems:   Acute respiratory failure (HCC)  (please populate well all problems here in Problem List. (For example, if patient is on BP meds at home and you resume or decide to hold them, it is a problem that needs to be her. 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 Chronic vocal cord dysfunction  status post tracheostomy Chronic dysphagia status post PEG Chronic mild aortic stenosis, moderate pulmonary hypertension Chronic pulmonary fibrosis -Improving, able to wean down oxygen this morning to  room air, admitted to stepdown unit for nocturnal ventilation support -Clinical review.-Mild fluid overload, with bilateral crackles, interstitial edema on x-ray and CT, will give 1 dose of IV Lasix , recheck x-ray tomorrow - Echocardiogram -Troponin negative x 2-, no chest pain, and EKG showed no signs of ischemic changes, ACS ruled out - Other DDx, pneumonia cannot be ruled out at this point, given there is a significant increase of secretions and WBC count.  Given there is a improvement of oxygenation, de-escalate antibiotic coverage to ceftriaxone  to cover possible CAP as well as UTI  Hyperkalemia - Mild, 1 dose of Lasix  - Repeat BMP tomorrow  UTI - Ceftriaxone   IDDM with hyperglycemia - SSI  HTN - Continue metoprolol   Diabetic neuropathy - Continue gabapentin   Deconditioning - PT evaluation  Total time spent on patient care 75 minutes.  DVT prophylaxis: Lovenox  Code Status: Full code Family Communication: None at bedside Disposition Plan: Patient is sick with significant hypoxia secondary to CHF,/requiring IV diuresis, and possible concurrent pneumonia requiring IV antibiotics, expect more than 2 midnight hospital stay Consults called: None Admission status: Stepdown unit admission   Cort ONEIDA Mana MD [facility] Pager [phone]  10/08/2023, 9:41 AM

## 2023-10-08 NOTE — ED Notes (Signed)
 Pt saturated in urine. This RN and Duwaine, RN cleaned pt, provided new linen, gown and chux pads. Pt placed on [place]. Call light within reach.

## 2023-10-08 NOTE — ED Provider Notes (Signed)
 [facility] Provider Note    Event Date/Time   First MD Initiated Contact with Patient 10/08/23 0034     (approximate)   History   Shortness of Breath   HPI  Angela David is a 83 y.o. female who presents to the ED for evaluation of Shortness of Breath   I reviewed [facility] DC summary from March.  Admitted for 4 days due to hypoxic respiratory failure.  Patient has a chronic tracheostomy, nighttime ventilator but room air during the daytime.  Treated for aspiration pneumonia.  Required EGD and dilation of distal aspect of esophagus.  Otherwise history of DM  Patient presents to the ED for evaluation of shortness of breath.  Found to be hypoxic on room air with sats in the 70s, not on her vent.   Physical Exam   Triage Vital Signs: ED Triage Vitals [10/08/23 0032]  Encounter Vitals Group     BP      Girls Systolic BP Percentile      Girls Diastolic BP Percentile      Boys Systolic BP Percentile      Boys Diastolic BP Percentile      Pulse      Resp      Temp      Temp src      SpO2 (!) 75 %     Weight      Height      Head Circumference      Peak Flow      Pain Score      Pain Loc      Pain Education      Exclude from Growth Chart     Most recent vital signs: Vitals:   10/08/23 0330 10/08/23 0430  BP: (!) 138/119   Pulse: 96 88  Resp: 20 18  Temp:    SpO2: 100% 100%    General: Awake, no distress.  CV:  Good peripheral perfusion.  Resp:  Tachypnea without distress Abd:  No distention.  GJ tube in place, excoriations around insertion site to the abdominal wall MSK:  No deformity noted.  Neuro:  No focal deficits appreciated. Other:     ED Results / Procedures / Treatments   Labs (all labs ordered are listed, but only abnormal results are displayed) Labs Reviewed  COMPREHENSIVE METABOLIC PANEL WITH GFR - Abnormal; Notable for the following components:      Result Value   Potassium 5.2 (*)    Glucose, Bld 281  (*)    BUN 26 (*)    Creatinine, Ser 1.11 (*)    Albumin  3.4 (*)    AST 72 (*)    GFR, Estimated 50 (*)    All other components within normal limits  CBC WITH DIFFERENTIAL/PLATELET - Abnormal; Notable for the following components:   WBC 18.3 (*)    Neutro Abs 15.0 (*)    Abs Immature Granulocytes 0.29 (*)    All other components within normal limits  LACTIC ACID, PLASMA - Abnormal; Notable for the following components:   Lactic Acid, Venous 2.1 (*)    All other components within normal limits  TROPONIN I (HIGH SENSITIVITY) - Abnormal; Notable for the following components:   Troponin I (High Sensitivity) 23 (*)    All other components within normal limits  CULTURE, BLOOD (ROUTINE X 2)  CULTURE, BLOOD (ROUTINE X 2)  MAGNESIUM   LACTIC ACID, PLASMA  PROCALCITONIN  URINALYSIS, ROUTINE W REFLEX MICROSCOPIC  TROPONIN I (HIGH  SENSITIVITY)    EKG Sinus tachycardia with rate of 106 bpm.  Normal axis, borderline prolonged PR interval without high-grade block.  No STEMI.  RADIOLOGY 1 view CXR interpreted by me without clear infiltrate CTA chest interpreted by me without PE  Official radiology report(s): CT Angio Chest PE W and/or Wo Contrast Result Date: 10/08/2023 EXAM: CTA of the Chest with contrast for PE 10/08/2023 03:25:32 AM TECHNIQUE: CTA of the chest was performed after the administration of intravenous contrast. Multiplanar reformatted images are provided for review. MIP images are provided for review. Automated exposure control, iterative reconstruction, and/or weight based adjustment of the mA/kV was utilized to reduce the radiation dose to as low as reasonably achievable. COMPARISON: 07/13/2022 CLINICAL HISTORY: Hypoxic trach patient. Eval PE, PNA, mucous plugging. FINDINGS: PULMONARY ARTERIES: There is satisfactory opacification of the pulmonary arteries. No signs of acute pulmonary embolism. MEDIASTINUM: Heart size is normal. No pericardial effusion. Aortic atherosclerosis and  coronary artery calcifications. There is a tracheostomy tube in place with the tip above the carina. The esophagus appears diffusely dilated, unchanged from the previous exam. No enlarged mediastinal or hilar lymph nodes. LYMPH NODES: No enlarged mediastinal or hilar lymph nodes. LUNGS AND PLEURA: There is decreased AP diameter of the trachea and central airways compatible with chronic tracheobronchomalacia. Diffuse bronchial wall thickening with increased soft tissue within the central peribronchovascular interstitium. Mosaic attenuation pattern is identified within both lungs. No pneumothorax. Small pleural effusions with bilateral posterior pleural thickening. Patchy ground glass attenuation and mild interstitial thickening noted bilaterally. Chronic fibrosis, architectural distortion, and volume loss are noted within the anterior medial right upper lobe. Bilateral pulmonary nodules are again noted. Index nodule within the right upper lobe measures 0.6 cm, image 30/6. Unchanged from previous exam. Right lower lobe lung nodule measures 6 mm, image 53/6. Unchanged from previous exam. Additional small scattered pulmonary nodules are similar to the previous exam, measuring less than 1 cm. UPPER ABDOMEN: No acute abnormality within the imaged portions of the upper abdomen. SOFT TISSUES AND BONES: Multiple remote left posterior rib fracture deformities. Numerous bilateral chronic healed rib fracture deformities are again noted, similar to the previous exam. No acute osseous abnormality. LIMITATIONS/ARTIFACTS: Study is mildly limited due to respiratory motion artifact. IMPRESSION: 1. No evidence of pulmonary embolism. 2. Bilateral pulmonary nodules, unchanged from previous exam. 3. Small pleural effusions with bilateral posterior pleural thickening. Mild interstitial thickening and ground-glass attenuation may reflect early pulmonary edema. Correlate for any clinical signs or symptoms of mild chf. 4. Decreased AP  diameter of the trachea and central airways compatible with chronic tracheobronchomalacia. 5. Mosaic attenuation pattern noted within both lungs compatible with small airways disease. Additionally, there is diffuse bronchial wall thickening with increased soft tissue within the peribronchovascular interstitium. 6. Chronic fibrosis, architectural distortion, and volume loss in the anterior medial right upper lobe. Unchanged from previous exam. 7. Aortic atherosclerosis and coronary artery calcifications. Electronically signed by: Waddell Calk MD 10/08/2023 06:02 AM EDT RP Workstation: [id number]   DG Chest Portable 1 View Result Date: 10/08/2023 CLINICAL DATA:  Hypoxia EXAM: PORTABLE CHEST 1 VIEW COMPARISON:  07/13/2022 FINDINGS: Tracheostomy in expected position. Stable elevation of right hemidiaphragm. Mild diffuse interstitial pulmonary infiltrate favored represent mild interstitial pulmonary edema. No confluent pulmonary infiltrate. No pneumothorax or pleural effusion. Cardiac size within normal limits. Surgical clips seen within the right axilla. IMPRESSION: 1. Mild interstitial pulmonary edema. Electronically Signed   By: Dorethia Molt M.D.   On: 10/08/2023 02:49    PROCEDURES and  INTERVENTIONS:  .Critical Care  Performed by: Claudene Rover, MD Authorized by: Claudene Rover, MD   Critical care provider statement:    Critical care time (minutes):  30   Critical care time was exclusive of:  Separately billable procedures and treating other patients   Critical care was necessary to treat or prevent imminent or life-threatening deterioration of the following conditions:  Respiratory failure   Critical care was time spent personally by me on the following activities:  Development of treatment plan with patient or surrogate, discussions with consultants, evaluation of patient's response to treatment, examination of patient, ordering and review of laboratory studies, ordering and review of radiographic  studies, ordering and performing treatments and interventions, pulse oximetry, re-evaluation of patient's condition and review of old charts .1-3 Lead EKG Interpretation  Performed by: Claudene Rover, MD Authorized by: Claudene Rover, MD     Interpretation: normal     ECG rate:  84   ECG rate assessment: normal     Rhythm: sinus rhythm     Ectopy: none     Conduction: normal     Medications  piperacillin -tazobactam (ZOSYN ) IVPB 3.375 g (0 g Intravenous Stopped 10/08/23 0255)  vancomycin  (VANCOREADY) IVPB 1500 mg/300 mL (1,500 mg Intravenous New Bag/Given 10/08/23 0339)  sodium chloride  0.9 % bolus 1,000 mL (1,000 mLs Intravenous New Bag/Given 10/08/23 0225)  iohexol  (OMNIPAQUE ) 350 MG/ML injection 100 mL (100 mLs Intravenous Contrast Given 10/08/23 0314)  hyaluronidase  Human (HYLENEX ) injection 150 Units (150 Units Subcutaneous Given 10/08/23 0514)     IMPRESSION / MDM / ASSESSMENT AND PLAN / ED COURSE  I reviewed the triage vital signs and the nursing notes.  Differential diagnosis includes, but is not limited to, ACS, PTX, PNA, muscle strain/spasm, PE, dissection, anxiety, pleural effusion  {Patient presents with symptoms of an acute illness or injury that is potentially life-threatening.  Trach patient presents to the ED short of breath and hypoxic.  Improved with blow-by oxygen and then placed on a ventilator.  Noted to have leukocytosis to 18K, mild AKI, normal lactic acid.  Draw cultures and provide antibiotics.  CTA chest without infiltrate and then she tells me she has had some dysuria.  Will add on a UA and urine culture.  Consult with medicine for admission  Clinical Course as of 10/08/23 0637  Tue Oct 08, 2023  STARLET Officer  [DS]  [id number] Reassessed  Patient and husband not some concerns about dysuria.  Will add on a urine sample [DS]    Clinical Course User Index [DS] Claudene Rover, MD     FINAL CLINICAL IMPRESSION(S) / ED DIAGNOSES   Final diagnoses:  Shortness of  breath  Hypoxia  Leukocytosis, unspecified type     Rx / DC Orders   ED Discharge Orders     None        Note:  This document was prepared using Dragon voice recognition software and may include unintentional dictation errors.   Claudene Rover, MD 10/08/23 [phone]

## 2023-10-08 NOTE — ED Notes (Signed)
 CCMD contacted and placed on cardiac monitor at this time.

## 2023-10-08 NOTE — Consult Note (Addendum)
 WOC Nurse Consult Note: Reason for Consult: irritation around PEG tube  Wound type: full thickness wound to lateral aspect of  G tube  Pressure Injury POA: Yes, appears to be related to pressure from plate of G tube  Measurement: see nursing flowsheet  Wound bed: pink moist  Drainage (amount, consistency, odor) some yellow drainage noted on old dressing  Periwound: mild erythema and moisture associated skin damage r/t drainage  Dressing procedure/placement/frequency: Cleanse around G tube with Vashe wound cleanser Soila [phone]) do not rinse and allow to air dry. Cut a piece of silver hydrofiber ([place] [phone] [employer]) to place in wound bed daily.  Apply a thin layer of Desitin to any surrounding irritated skin. Cover with split gauze and tape    POC discussed with bedside nurse. Appreciate B Buono, RN assistance with this consult.   WOC team will not follow. Re-consult if further needs arise.   Thank you,    Powell Bar MSN, RN-BC, [employer]

## 2023-10-08 NOTE — Progress Notes (Signed)
 Nutrition Follow-up  DOCUMENTATION CODES:   Not applicable  INTERVENTION:   Initiate Mallie Pinion RTH @55ml /hr continuous   Free water  flushes 50ml q4 hours to maintain tube patency   Regimen provides 1848kcal/day, 82g/day protein and 1271ml/day of free water .   Mechanical soft diet   Daily weights   Check B12 level  NUTRITION DIAGNOSIS:   Inadequate oral intake related to dysphagia as evidenced by NPO status (pt with chronic G-tube).  GOAL:   Patient will meet greater than or equal to 90% of their needs  MONITOR:   PO intake, Labs, Weight trends, TF tolerance, Skin, I & O's  REASON FOR ASSESSMENT:   Consult Enteral/tube feeding initiation and management  ASSESSMENT:   83 year old female with PMHx of breast cancer s/p lumpectomy, right partial mastectomy and chemo/XRT, DM, diabetic peripheral neuropathy, GERD, vocal cord paralysis, tracheal stenosis requiring chronic tracheostomy, aspiration PNA, chronic G-tube (placed surgically 01/29/2020), s/p hand-assisted laparoscopic right hemicolectomy 11/06/2018 and s/p robotic assisted incisional ventral hernia repair on 10/18/2020 who is now admitted with CHF exacerbation and AKI.  Met with pt and pt's husband in room today. Pt is well known to this RD from a previous lengthy admission. Pt reports that she is feeling much better today. Pt with chronic dysphagia and G-tube since 2021. Pt reports that she continues to use the [employer] standard 1.4 formula at home. Pt does bolus feeds of 3 cartons of tube feed formula per day along with some free water  before and after each feed. Pt does report some issues with leaking and irritation around the G-tube site. Pt also reports that she has a hernia in her stomach which slides in and out that surgery is aware of. Pt is able to eat a mechanical soft diet at home mainly for pleasure. Pt reports eating things like mashed potatoes and soups. Pt reports that she is able to order foods that she  knows she is able to eat. Pt does report occasional bloating after eating. Pt takes protonix  for indigestion of which she feels is r/t her medications.   Will initiate tube feeds today. Pt is agreeable to continuous feeds while in hospital. Pt is likely at low refeed risk.   Pt is mostly wheelchair bound. Pt reports that she is unable to walk more than a few steps with a walker in relation to her neuropathy which she reports has been getting worse. RD will check vitamin B12 levels to r/o deficiency as pt on chronic PPIs.   Per chart, pt appears fairly weight stable at baseline. Pt is up ~12lbs from her UBW currently.    Medications reviewed and include: lovenox , insulin , melatonin, protonix , ceftriaxone   Labs reviewed: K 5.2(H), BUN 26(H), creat 1.11(H), Mg 2.0 wnl Wbc- 18.3(H)  NUTRITION - FOCUSED PHYSICAL EXAM:  Flowsheet Row Most Recent Value  Orbital Region Mild depletion  Upper Arm Region No depletion  Thoracic and Lumbar Region No depletion  Buccal Region Mild depletion  Temple Region Mild depletion  Clavicle Bone Region Moderate depletion  Clavicle and Acromion Bone Region Moderate depletion  Scapular Bone Region No depletion  Dorsal Hand No depletion  Patellar Region No depletion  Anterior Thigh Region No depletion  Posterior Calf Region No depletion  Edema (RD Assessment) Mild  Hair Reviewed  Eyes Reviewed  Mouth Reviewed  Skin Reviewed  Nails Reviewed   Diet Order:   Diet Order             DIET DYS 3 Room  service appropriate? Yes; Fluid consistency: Thin  Diet effective now                  EDUCATION NEEDS:   Education needs have been addressed  Skin:  Skin Assessment: Reviewed RN Assessment  Last BM:  pta  Height:   Ht Readings from Last 1 Encounters:  10/08/23 5' 5 (1.651 m)    Weight:   Wt Readings from Last 1 Encounters:  10/08/23 75.3 kg    Ideal Body Weight:  56.8 kg  BMI:  Body mass index is 27.62 kg/m.  Estimated Nutritional  Needs:   Kcal:  1600-1800kcal/day  Protein:  80-90g/day  Fluid:  1.4-1.6L/day  Augustin Shams MS, RD, LDN If unable to be reached, please send secure chat to RD inpatient available from 8:00a-4:00p daily

## 2023-10-08 NOTE — Progress Notes (Signed)
 Called by RN to suction patient, after pt suctioned placed pt on 45%Venturi mask for trip to CT. Pt stated she would be ready to be placed on vent upon return.

## 2023-10-08 NOTE — ED Notes (Signed)
 RT at bedside.

## 2023-10-08 NOTE — Progress Notes (Signed)
 PT Cancellation Note  Patient Details Name: [name] MRN: [id number] DOB: August 19, 1940   Cancelled Treatment:    Reason Eval/Treat Not Completed: Other (comment) Orders received, chart reviewed. On arrival, patient with pharmacy reviewing medication. On second attempt, dietician present with patient. Will re-attempt at later date/time.   [name], PT, DPT Physical Therapist - [facility]  [facility]    Alecsander Hattabaugh A Loretta Kluender 10/08/2023, 3:04 PM

## 2023-10-09 ENCOUNTER — Inpatient Hospital Stay (HOSPITAL_COMMUNITY)
Admit: 2023-10-09 | Discharge: 2023-10-09 | Disposition: A | Discharge status: Home / Self Care | Attending: Internal Medicine | Admitting: Internal Medicine

## 2023-10-09 ENCOUNTER — Inpatient Hospital Stay

## 2023-10-09 DIAGNOSIS — R0902 Hypoxemia: Secondary | ICD-10-CM | POA: Diagnosis not present

## 2023-10-09 DIAGNOSIS — I5031 Acute diastolic (congestive) heart failure: Secondary | ICD-10-CM | POA: Diagnosis not present

## 2023-10-09 LAB — CBC
HCT: 37.5 % (ref 36.0–46.0)
Hemoglobin: 12.4 g/dL (ref 12.0–15.0)
MCH: 29 pg (ref 26.0–34.0)
MCHC: 33.1 g/dL (ref 30.0–36.0)
MCV: 87.6 fL (ref 80.0–100.0)
Platelets: 190 K/uL (ref 150–400)
RBC: 4.28 MIL/uL (ref 3.87–5.11)
RDW: 13 % (ref 11.5–15.5)
WBC: 9.2 K/uL (ref 4.0–10.5)
nRBC: 0 % (ref 0.0–0.2)

## 2023-10-09 LAB — BASIC METABOLIC PANEL WITH GFR
Anion gap: 11 (ref 5–15)
BUN: 30 mg/dL — ABNORMAL HIGH (ref 8–23)
CO2: 25 mmol/L (ref 22–32)
Calcium: 9.4 mg/dL (ref 8.9–10.3)
Chloride: 103 mmol/L (ref 98–111)
Creatinine, Ser: 1.12 mg/dL — ABNORMAL HIGH (ref 0.44–1.00)
GFR, Estimated: 50 mL/min — ABNORMAL LOW (ref >60)
Glucose, Bld: 264 mg/dL — ABNORMAL HIGH (ref 70–99)
Potassium: 4.2 mmol/L (ref 3.5–5.1)
Sodium: 139 mmol/L (ref 135–145)

## 2023-10-09 LAB — ECHOCARDIOGRAM COMPLETE
AR max vel: 0.39 cm2
AV Area VTI: 0.35 cm2
AV Area mean vel: 0.35 cm2
AV Mean grad: 24 mmHg
AV Peak grad: 37.9 mmHg
Ao pk vel: 3.08 m/s
Height: 65 in
MV VTI: 0.59 cm2
S' Lateral: 2.3 cm
Weight: 2645.52 [oz_av]

## 2023-10-09 LAB — MYCOPLASMA PNEUMONIAE ANTIBODY, IGM: Mycoplasma pneumo IgM: <770 U/mL (ref 0–769)

## 2023-10-09 LAB — PHOSPHORUS: Phosphorus: 3.7 mg/dL (ref 2.5–4.6)

## 2023-10-09 LAB — URINE CULTURE: Culture: <10000 — AB

## 2023-10-09 LAB — GLUCOSE, CAPILLARY
Glucose-Capillary: 265 mg/dL — ABNORMAL HIGH (ref 70–99)
Glucose-Capillary: 227 mg/dL — ABNORMAL HIGH (ref 70–99)
Glucose-Capillary: 216 mg/dL — ABNORMAL HIGH (ref 70–99)
Glucose-Capillary: 165 mg/dL — ABNORMAL HIGH (ref 70–99)
Glucose-Capillary: 205 mg/dL — ABNORMAL HIGH (ref 70–99)

## 2023-10-09 LAB — MAGNESIUM: Magnesium: 2.1 mg/dL (ref 1.7–2.4)

## 2023-10-09 LAB — VITAMIN B12: Vitamin B-12: 732 pg/mL (ref 180–914)

## 2023-10-09 LAB — LEGIONELLA PNEUMOPHILA SEROGP 1 UR AG: L. pneumophila Serogp 1 Ur Ag: NEGATIVE

## 2023-10-09 MED ORDER — PERFLUTREN LIPID MICROSPHERE
1.0000 mL | INTRAVENOUS | Status: AC | PRN
  Administered 2023-10-09 (×2): 3 mL via INTRAVENOUS

## 2023-10-09 MED ORDER — SODIUM CHLORIDE 0.9 % IV SOLN: INTRAVENOUS | Status: AC | PRN

## 2023-10-09 NOTE — Evaluation (Signed)
 Occupational Therapy Evaluation Patient Details Name: Angela David MRN: [id number] DOB: 01-23-1941 Today's Date: 10/09/2023   History of Present Illness   83 y/o female presented to ED on 10/08/23 for sudden onset of SOB. Admitted for acute on chronic hypoxic respiratory failure. PMH: vocal cord dysfunction with chronic hypoxic respiratory failure with chronic tracheostomy and PEG, on nocturnal ventilation, moderate pulmonary HTN, breast cancer s/p lumpectomy with radiation and chemo, IDDM, diabetic neuropathy     Clinical Impressions Chart reviewed to date, pt greeted in bed, alert and oriented x4, agreeable to OT evaluation. Pt has a chronic trach and will occlude trach to speak or mouth words. She reports she requires assist for ADL/IADL from her husband, most recently has been transferring to [facility] and self propelling throughout her house due to her neuropathy. Pt presents with deficits in activity tolerance, endurance affecting safe and optimal ADL completion. Pt reports she feels she is not far from current baseline, perhaps a little weaker. Educated pt on the importance of continued mobility attempts to decrease regression of skills/strength. Pt is left as received, all needs met. OT will follow.      If plan is discharge home, recommend the following:   A little help with walking and/or transfers;A little help with bathing/dressing/bathroom;Help with stairs or ramp for entrance;Direct supervision/assist for medications management;Assistance with cooking/housework     Functional Status Assessment   Patient has had a recent decline in their functional status and demonstrates the ability to make significant improvements in function in a reasonable and predictable amount of time.     Equipment Recommendations   None recommended by OT;Other (comment) (power chair evaluation with an ATP/HH therapy after discharge; educated pt on this recommendation)     Recommendations for  Other Services         Precautions/Restrictions   Precautions Precautions: Fall Recall of Precautions/Restrictions: Intact Precaution/Restrictions Comments: trach, PEG Restrictions Weight Bearing Restrictions Per Provider Order: No     Mobility Bed Mobility Overal bed mobility: Needs Assistance Bed Mobility: Supine to Sit, Sit to Supine     Supine to sit: Min assist Sit to supine: Min assist        Transfers Overall transfer level: Needs assistance Equipment used: Rolling walker (2 wheels) Transfers: Sit to/from Stand Sit to Stand: Contact guard assist           General transfer comment: lateral steps up the bed to the L with CGA      Balance Overall balance assessment: Needs assistance Sitting-balance support: No upper extremity supported, Feet supported Sitting balance-Leahy Scale: Good     Standing balance support: Bilateral upper extremity supported, During functional activity Standing balance-Leahy Scale: Fair                             ADL either performed or assessed with clinical judgement   ADL Overall ADL's : Needs assistance/impaired     Grooming: Set up;Sitting           Upper Body Dressing : Minimal assistance Upper Body Dressing Details (indicate cue type and reason): anticipate Lower Body Dressing: Minimal assistance Lower Body Dressing Details (indicate cue type and reason): socks Toilet Transfer: Contact guard assist;Rolling walker (2 wheels) Toilet Transfer Details (indicate cue type and reason): simulated                 Vision Patient Visual Report: No change from baseline  Perception         Praxis         Pertinent Vitals/Pain Pain Assessment Pain Assessment: No/denies pain     Extremity/Trunk Assessment Upper Extremity Assessment Upper Extremity Assessment: Generalized weakness   Lower Extremity Assessment Lower Extremity Assessment: Defer to PT evaluation       Communication  Communication Communication: Impaired Factors Affecting Communication: Trach/intubated;Other (comment) (chronic trach/ finger occludes but reports she also uses ipad at home)   Cognition Arousal: Alert Behavior During Therapy: WFL for tasks assessed/performed Cognition: No apparent impairments                               Following commands: Intact       Cueing  General Comments      on RA throughout session, spo2 >90% throughout   Exercises Other Exercises Other Exercises: edu re role of OT, role of rehab, discharge recommendations   Shoulder Instructions      Home Living Family/patient expects to be discharged to:: Private residence Living Arrangements: Spouse/significant other Available Help at Discharge: Family;Available 24 hours/day Type of Home: House Home Access: Ramped entrance     Home Layout: One level     Bathroom Shower/Tub: Walk-in shower;Sponge bathes at baseline         Home Equipment: Agricultural consultant (2 wheels);Cane - single point;BSC/3in1;Wheelchair Writer (comment)          Prior Functioning/Environment Prior Level of Function : Needs assist             Mobility Comments: RW to transfer to and from [facility]- was walking appro x80' with HHPT in may 2025 but she reports limited amb after that due to neuropathy; self propels wiht BUE ADLs Comments: assist for dressing, bathing, IADLs from husband    OT Problem List: Decreased activity tolerance;Decreased knowledge of use of DME or AE;Decreased strength;Impaired balance (sitting and/or standing)   OT Treatment/Interventions: Self-care/ADL training;DME and/or AE instruction;Therapeutic activities;Balance training;Therapeutic exercise;Energy conservation;Patient/family education      OT Goals(Current goals can be found in the care plan section)   Acute Rehab OT Goals Patient Stated Goal: move OT Goal Formulation: With patient Time For Goal Achievement:  10/23/23 Potential to Achieve Goals: Good ADL Goals Pt Will Perform Grooming: with set-up;sitting Pt Will Perform Lower Body Dressing: with min assist Pt Will Transfer to Toilet: with contact guard assist Pt Will Perform Toileting - Clothing Manipulation and hygiene: with contact guard assist   OT Frequency:  Min 2X/week    Co-evaluation PT/OT/SLP Co-Evaluation/Treatment: Yes Reason for Co-Treatment: For patient/therapist safety (?tolerance)   OT goals addressed during session: ADL's and self-care      AM-PAC OT 6 Clicks Daily Activity     Outcome Measure Help from another person eating meals?: None Help from another person taking care of personal grooming?: None Help from another person toileting, which includes using toliet, bedpan, or urinal?: A Little Help from another person bathing (including washing, rinsing, drying)?: A Little Help from another person to put on and taking off regular upper body clothing?: A Little Help from another person to put on and taking off regular lower body clothing?: A Little 6 Click Score: 20   End of Session Equipment Utilized During Treatment: Rolling walker (2 wheels) Nurse Communication: Mobility status  Activity Tolerance: Patient tolerated treatment well Patient left: in bed;with call bell/phone within reach;with nursing/sitter in room  OT Visit Diagnosis: Muscle weakness (generalized) (  M62.81);Other abnormalities of gait and mobility (R26.89)                Time: 8981-8961 OT Time Calculation (min): 20 min Charges:  OT General Charges $OT Visit: 1 Visit OT Evaluation $OT Eval Moderate Complexity: 1 Mod Therisa Sheffield, OTD OTR/L  10/09/23, 2:14 PM

## 2023-10-09 NOTE — Progress Notes (Signed)
 Progress Note   Patient: Angela David FMW:[id number] DOB: 1940-04-04 DOA: 10/08/2023     1 DOS: the patient was seen and examined on 10/09/2023   Brief hospital course: From HPI Angela David is a 83 y.o. 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   Patient was feeling fine yesterday Angela David, she woke up at night complaining about  cannot breathe, denied any fever or chills no cough no chest pains.  At baseline she breathes through tracheostomy on room air and connected to ventilator at night.  She uses walker and wheelchair to ambulate but never experienced any exertional dyspnea or orthopnea before.  Denied any nauseous vomiting, she uses most feeding only for pleasure, and she has been stable on PEG tube feeding.   ED Course: Afebrile, tachycardia tachypneic O2 saturation 75%, on room air and patient was put on nonrebreather mask O2 saturation improved to 100%.  Overnight ED physician and staff did multiple suctioning with copious amount of clear phlegm and O2 saturation maintained well.  Chest x-ray showed mild interstitial edema, CTA showed no PE, signs of small airway disease and diffuse bronchial wall thickening with increased soft tissue within peribronchial vascular interstitium, chronic fibrosis.  Blood work showed WBC 18.3 hemoglobin 13.8 glucose 281 BUN 26 creatinine 1.1 bicarb 22K 5.2   Assessment and Plan:   Acute on chronic hypoxic respiratory failure Acute on chronic HFpEF decompensation Chronic vocal cord dysfunction status post tracheostomy Chronic dysphagia status post PEG Chronic mild aortic stenosis, moderate pulmonary hypertension Chronic  pulmonary fibrosis -Clinical review.-Mild fluid overload, with bilateral crackles, interstitial edema on x-ray and CT Follow-up on echocardiogram -Troponin negative x 2-, no chest pain, and EKG showed no signs of ischemic changes, ACS ruled out - Other DDx, pneumonia cannot be ruled out at this point, given there is a significant increase of secretions and WBC count.   Continue ceftriaxone   Hyperkalemia Monitor electrolyte closely   UTI Continue ceftriaxone    IDDM with hyperglycemia - SSI   HTN - Continue metoprolol    Diabetic neuropathy - Continue gabapentin    Deconditioning - PT evaluation    DVT prophylaxis: Lovenox   Code Status: Full code  Family Communication: None at bedside   Consults called: None  Subjective:  Patient seen and examined at bedside this morning in the presence of the husband She tells me she is improving Denies worsening respiratory function  Physical Exam:  ENMT: Mucous membranes are moist. Posterior pharynx clear of any exudate or lesions.Normal dentition.  Neck: normal, supple, no masses, no thyromegaly Respiratory: Air entry decreased at the bases bilaterally Cardiovascular: Regular rate and rhythm, systolic murmur on heart base. No extremity edema. 2+ pedal pulses. No carotid bruits.  Abdomen: no tenderness, no masses palpated. No hepatosplenomegaly. Bowel sounds positive.  Musculoskeletal: no clubbing / cyanosis. No joint deformity upper and lower extremities. Good ROM, no contractures. Normal muscle tone.  Skin: no rashes, lesions, ulcers. No induration Neurologic: CN 2-12 grossly intact. Sensation intact, DTR normal. Strength 5/5 in all 4.  Psychiatric: Normal judgment and insight. Alert and oriented x 3. Normal mood.       Vitals:   10/09/23 0800 10/09/23 0900 10/09/23 1000 10/09/23 1100  BP: (!) 109/50 (!) 116/56 (!) 116/52 (!) 102/41  Pulse: 83 85 84 84  Resp: (!) 21 18 (!) 21 14  Temp:      TempSrc:      SpO2: 98% 97% 98%  97%  Weight:      Height:        Data Reviewed:  Chest x-ray reviewed    Latest Ref Rng & Units 10/09/2023    3:38 AM 10/08/2023   12:39 AM 11/22/2022    9:10 PM  CBC  WBC 4.0 - 10.5 K/uL 9.2  18.3  9.0   Hemoglobin 12.0 - 15.0 g/dL 87.5  86.1  85.2   Hematocrit 36.0 - 46.0 % 37.5  42.8  46.1   Platelets 150 - 400 K/uL 190  213  214        Latest Ref Rng & Units 10/09/2023    3:38 AM 10/08/2023   12:39 AM 11/22/2022    9:10 PM  BMP  Glucose 70 - 99 mg/dL 735  718  812   BUN 8 - 23 mg/dL 30  26  25    Creatinine 0.44 - 1.00 mg/dL 8.87  8.88  9.14   Sodium 135 - 145 mmol/L 139  135  136   Potassium 3.5 - 5.1 mmol/L 4.2  5.2  4.6   Chloride 98 - 111 mmol/L 103  101  101   CO2 22 - 32 mmol/L 25  22  23    Calcium  8.9 - 10.3 mg/dL 9.4  9.1  9.4      Family Communication: Discussed with husband at bedside  Disposition: Status is: Inpatient  Time spent: 53 minutes  Author: Drue ONEIDA Potter, MD 10/09/2023 4:10 PM  For on call review [email or web address].

## 2023-10-09 NOTE — Evaluation (Signed)
 Physical Therapy Evaluation Patient Details Name: Angela David MRN: [id number] DOB: 27-Sep-1940 Today's Date: 10/09/2023  History of Present Illness  83 y/o female presented to ED on 10/08/23 for sudden onset of SOB. Admitted for acute on chronic hypoxic respiratory failure. 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 PTA, patient lives with husband and is primarily w/c bound and transfers with assistance per patient, however can self propel w/c with BUE. Patient presents with weakness, impaired balance, and decreased activity tolerance. Required minA for bed mobility and CGA for sit to stand and sidestepping at EOB with RW. VSS on RA throughout. Patient utilizes finger to occlude trach when speaking or therapist reading lips during session. Patient will benefit from skilled PT services during acute stay to address listed deficits. Patient will benefit from ongoing therapy at discharge to maximize functional independence and safety.         If plan is discharge home, recommend the following: A little help with walking and/or transfers;A little help with bathing/dressing/bathroom;Assistance with cooking/housework;Assist for transportation;Help with stairs or ramp for entrance   Can travel by private vehicle        Equipment Recommendations None recommended by PT  Recommendations for Other Services       Functional Status Assessment Patient has had a recent decline in their functional status and demonstrates the ability to make significant improvements in function in a reasonable and predictable amount of time.     Precautions / Restrictions Precautions Precautions: Fall Recall of Precautions/Restrictions: Intact Precaution/Restrictions  Comments: trach, PEG Restrictions Weight Bearing Restrictions Per Provider Order: No      Mobility  Bed Mobility Overal bed mobility: Needs Assistance Bed Mobility: Supine to Sit, Sit to Supine     Supine to sit: Min assist Sit to supine: Min assist        Transfers Overall transfer level: Needs assistance Equipment used: Rolling Tanekia Ryans (2 wheels) Transfers: Sit to/from Stand Sit to Stand: Contact guard assist                Ambulation/Gait Ambulation/Gait assistance: Contact guard assist Gait Distance (Feet): 4 Feet Assistive device: Rolling Marquette Blodgett (2 wheels) Gait Pattern/deviations: Step-to pattern Gait velocity: decreased     General Gait Details: sidesteps towards [facility] with use of RW  Stairs            Wheelchair Mobility     Tilt Bed    Modified Rankin (Stroke Patients Only)       Balance Overall balance assessment: Needs assistance Sitting-balance support: No upper extremity supported, Feet supported Sitting balance-Leahy Scale: Good     Standing balance support: Bilateral upper extremity supported, During functional activity Standing balance-Leahy Scale: Fair                               Pertinent Vitals/Pain Pain Assessment Pain Assessment: No/denies pain    Home Living Family/patient expects to be discharged to:: Private residence Living Arrangements: Spouse/significant other Available Help at Discharge: Family;Available 24 hours/day Type of Home: House Home Access: Ramped entrance       Home Layout: One level Home Equipment: Agricultural consultant (2 wheels);Cane - single point;BSC/3in1;Wheelchair - manual;Other (comment) (adjustable bed)      Prior Function Prior Level of Function :  Needs assist             Mobility Comments: uses RW to transfer to/from w/c. Primarily w/c bound at baseline. Able to self propel w/c with BUE       Extremity/Trunk Assessment   Upper Extremity Assessment Upper Extremity  Assessment: Defer to OT evaluation    Lower Extremity Assessment Lower Extremity Assessment: Generalized weakness       Communication   Communication Communication: Impaired Factors Affecting Communication: Other (comment) (Chronic trach, finger occludes to speak at times)    Cognition Arousal: Alert Behavior During Therapy: WFL for tasks assessed/performed   PT - Cognitive impairments: No apparent impairments                         Following commands: Intact       Cueing       General Comments General comments (skin integrity, edema, etc.): On RA throughout session    Exercises     Assessment/Plan    PT Assessment Patient needs continued PT services  PT Problem List Decreased strength;Decreased activity tolerance;Decreased balance;Decreased mobility;Cardiopulmonary status limiting activity       PT Treatment Interventions DME instruction;Gait training;Functional mobility training;Therapeutic activities;Therapeutic exercise;Balance training;Patient/family education    PT Goals (Current goals can be found in the Care Plan section)  Acute Rehab PT Goals Patient Stated Goal: to maintain strength PT Goal Formulation: With patient Time For Goal Achievement: 10/23/23 Potential to Achieve Goals: Good    Frequency Min 2X/week     Co-evaluation               AM-PAC PT 6 Clicks Mobility  Outcome Measure Help needed turning from your back to your side while in a flat bed without using bedrails?: A Little Help needed moving from lying on your back to sitting on the side of a flat bed without using bedrails?: A Little Help needed moving to and from a bed to a chair (including a wheelchair)?: A Little Help needed standing up from a chair using your arms (e.g., wheelchair or bedside chair)?: A Little Help needed to walk in hospital room?: A Lot Help needed climbing 3-5 steps with a railing? : A Lot 6 Click Score: 16    End of Session   Activity  Tolerance: Patient tolerated treatment well Patient left: in bed;with call bell/phone within reach;with nursing/sitter in room Nurse Communication: Mobility status PT Visit Diagnosis: Unsteadiness on feet (R26.81);Other abnormalities of gait and mobility (R26.89);Muscle weakness (generalized) (M62.81)    Time: 8981-8962 PT Time Calculation (min) (ACUTE ONLY): 19 min   Charges:   PT Evaluation $PT Eval Moderate Complexity: 1 Mod   PT General Charges $$ ACUTE PT VISIT: 1 Visit         [name], PT, DPT Physical Therapist - [facility]  [facility]   Tyrell Seifer A Vivian Okelley 10/09/2023, 1:34 PM

## 2023-10-09 NOTE — Progress Notes (Signed)
 IR consulted for c/o leakage around GJ tube.  Patient has had a long term GJ (placed as PEG by outside group, first seen by IR for c/o leakage around tube 10/2021). Since then, she has been upsized  and converted to GJ by our team to address leaking.  Most recent contact for GJ was 05/02/23 by Dr. Luverne who replaced 24Fr after the tip broke off and was passed per rectum.  She also has a ventral hernia.  Upon exam, receiving TF without leakage. Balloon not visible externally as it was at one time. The tract is larger than the tube, but the nurse has dressed well and taken measures to protect the skin to encourage healing.  Husband voiced trouble with keeping the bumper adequately tight. He is hoping that replacing with a new tube would allow them to cinch down the bumper without it loosening as quickly as it does now.  Patient is able to receive nutrition overnight.  Case will be reviewed with rad in AM.

## 2023-10-09 NOTE — TOC Initial Note (Signed)
 Transition of Care [facility]) - Initial/Assessment Note    Patient Details  Name: Angela David MRN: [id number] Date of Birth: 08-05-1940  [facility] CM/SW Contact:    Angela JINNY Ruts, LCSW Phone Number: 10/09/2023, 3:33 PM  Clinical Narrative:                 Chart reviewed. The patient was admitted for Hypoxia. I spoke with the patient and the patient husband, Angela David at bed side today. I introduced myself, my role, reason for consult, and HIPAA. The husband was able to answer the questions during the consult because the patient has a trach placed. The patient husband reports that its only him and the patient living in the home. The  patient husband, confirms that the patient PCP is Angela David. The patient husband reports that the patient needs assistance completing daily living task. The patient husband confirms that the will help at discharge. [name] reports that [facility] and the patient copays are affordable. The patient husband confirmed that the patient had HH with [facility] in the past. The patient reports that [facility] the home last may. Per chart review that patient was active with [facility] last year. The patient husband confirms that the patient has a vent, walker, BSC, nebulizer, suction machine, and wheel chair in the home.   I provided a list of HH agencies to the patient and the patient husband.   TOC will follow up with the patient and the patient husband at discharge.     Barriers to Discharge: Continued Medical Work up   Patient Goals and CMS Choice            Expected Discharge Plan and Services       Living arrangements for the past 2 months: Single Family Home                                      Prior Living Arrangements/Services Living arrangements for the past 2 months: Single Family Home Lives with:: Spouse Patient language and need for interpreter reviewed:: Yes        Need for Family  Participation in Patient Care: Yes (Comment)     Criminal Activity/Legal Involvement Pertinent to Current Situation/Hospitalization: No - Comment as needed  Activities of Daily Living      Permission Sought/Granted                  Emotional Assessment Appearance:: Appears stated age Attitude/Demeanor/Rapport: Gracious Affect (typically observed): Calm Orientation: : Oriented to Self, Oriented to Place, Oriented to  Time, Oriented to Situation Alcohol  / Substance Use: Not Applicable Psych Involvement: No (comment)  Admission diagnosis:  Shortness of breath [R06.02] Hypoxia [R09.02] Leukocytosis, unspecified type [D72.829] Patient Active Problem List   Diagnosis Date Noted   Cerebral vascular disease 06/20/2022   Dyspnea 06/20/2022   Muscle weakness (generalized) 06/20/2022   Menopausal and postmenopausal disorder 08/09/2021   Acute on chronic respiratory failure with hypoxia and hypercapnia (HCC) 06/27/2021   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   Acute pulmonary edema (HCC)    Somnolence    Sepsis (HCC) 02/20/2021   Abdominal wall abscess    Chronic kidney disease, stage 3a (HCC) 04/12/2020   Chronic respiratory failure with hypoxia (HCC) 03/29/2020   Chronic diastolic CHF (congestive heart failure) (HCC) 01/31/2020   Uncontrolled type 2 diabetes mellitus  with hyperglycemia (HCC) 01/31/2020   AF (paroxysmal atrial fibrillation) (HCC) 01/31/2020   Aspiration, chronic pulmonary 01/28/2020   Acute metabolic encephalopathy 01/28/2020   Acute respiratory failure (HCC) 01/23/2020   Left lower lobe pneumonia 01/16/2020   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/05/2019   Weakness    Hypoxia    Acute respiratory failure with hypoxia and hypercapnia (HCC) 03/21/2019   Acute encephalopathy 03/21/2019   Multiple fractures of ribs, right side, initial encounter for  closed fracture 12/25/2018   Colon polyp 11/06/2018   Esophageal dysphagia    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    Stricture and stenosis of esophagus    Elevated CEA 09/02/2018   Goals of care, counseling/discussion 09/02/2018   Polyp of transverse colon 09/02/2018   Foreign body in right foot 05/26/2018   History of tracheostomy 04/30/2018   Moderate episode of recurrent major depressive disorder (HCC) 04/03/2018   Poor balance 04/03/2018   Shoulder lesion, right 02/28/2017   Cervical radiculopathy 09/28/2016   Peripheral neuropathy 08/28/2016   Diabetic peripheral neuropathy (HCC) 08/28/2016   Leg weakness, bilateral 08/13/2016   Gait abnormality 08/13/2016   Carcinoma of overlapping sites of right breast in female, estrogen receptor negative (HCC) 12/27/2015   Malignant neoplasm of breast (HCC) 03/28/2015   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   Hypercholesterolemia 03/28/2015   Recurrent major depressive disorder, in partial remission (HCC) 03/01/2015   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   Cephalalgia 09/15/2014   Headache due to trauma 09/15/2014   Osteopenia 01/07/2014   Age-related osteoporosis without current pathological fracture 01/07/2014   History of colonic polyps 12/14/2013   History of breast cancer 11/26/2012   H/O malignant neoplasm of breast 11/26/2012   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 [place] [address] Phone: [phone] Fax: [phone]  [facility] [address] [place]  [address] Phone: [phone] Fax: [phone]  [facility] - [place], [place] - [address] [place] [phone] Phone: [phone] Fax: [phone]  [facility] - [address]), [place] - [address] [place]) [place] [address] Phone: [phone] Fax: [phone]     Social Drivers of Health (SDOH) Social History: SDOH Screenings   Food Insecurity: No Food Insecurity (10/08/2023)  Housing: Low Risk  (10/08/2023)  Transportation Needs: No Transportation Needs (10/08/2023)  Utilities: Not At Risk (10/08/2023)  Depression (PHQ2-9): Low Risk  (07/12/2022)  Financial Resource Strain: Low Risk  (05/16/2023)   Received from [facility]  Physical Activity: Inactive (07/08/2020)   Received from [facility]  Social Connections: Unknown (10/08/2023)  Stress: Stress Concern Present (07/08/2020)   Received from [facility]  Tobacco Use: Low Risk  (10/08/2023)   SDOH Interventions:     Readmission Risk Interventions    10/09/2023    3:31 PM 06/30/2021    2:19 PM 02/23/2021   12:23 PM  Readmission Risk Prevention Plan  Transportation Screening Complete Complete Complete  PCP or Specialist Appt within  3-5 Days Complete  Complete  HRI or Home Care Consult   Complete  Social Work Consult for Recovery Care Planning/Counseling Complete  Complete  Palliative Care Screening Not Applicable  Not Applicable  Medication Review Oceanographer) Complete Complete Complete  PCP or Specialist appointment within 3-5 days of discharge  Complete   HRI or Home Care Consult  Complete   SW Recovery Care/Counseling Consult  Complete   Palliative Care Screening  Complete   Skilled Nursing Facility  Not Applicable

## 2023-10-09 NOTE — Inpatient Diabetes Management (Signed)
 Inpatient Diabetes Program Recommendations  AACE/ADA: New Consensus Statement on Inpatient Glycemic Control   Target Ranges:  Prepandial:   less than 140 mg/dL      Peak postprandial:   less than 180 mg/dL (1-2 hours)      Critically ill patients:  140 - 180 mg/dL    Latest Reference Range & Units 10/09/23 03:38  Glucose 70 - 99 mg/dL 735 (H)    Latest Reference Range & Units 10/08/23 11:05 10/08/23 16:31 10/08/23 21:09  Glucose-Capillary 70 - 99 mg/dL 831 (H) 881 (H) 809 (H)   Review of Glycemic Control  Diabetes history: DM2 Outpatient Diabetes medications: Humalog 0-5 units TID, Toujeo  25 units daily Current orders for Inpatient glycemic control: Semglee  10 units daily, Novolog  0-15 units TID with meals, Novolog  0-5 units at bedtime; Mallie Pinion @ 55 ml/hr  Inpatient Diabetes Program Recommendations:    Insulin : Please consider changing CBGs to Q4H and Novolog  0-15 units to Q4H, ordering Novolog  2 units Q4H for tube feeding coverage. If tube feeding is stopped or held then Novolog  tube feeding coverage should also be stopped or held.  Thanks, Earnie Gainer, RN, MSN, CDCES Diabetes Coordinator Inpatient Diabetes Program [phone] (Team Pager from 8am to 5pm)

## 2023-10-09 NOTE — Plan of Care (Signed)
  Problem: Coping: Goal: Ability to adjust to condition or change in health will improve Outcome: Progressing   Problem: Nutritional: Goal: Maintenance of adequate nutrition will improve Outcome: Progressing Goal: Progress toward achieving an optimal weight will improve Outcome: Progressing   Problem: Clinical Measurements: Goal: Ability to maintain clinical measurements within normal limits will improve Outcome: Progressing Goal: Diagnostic test results will improve Outcome: Progressing   Problem: Coping: Goal: Level of anxiety will decrease Outcome: Progressing   Problem: Pain Managment: Goal: General experience of comfort will improve and/or be controlled Outcome: Progressing   Problem: Safety: Goal: Ability to remain free from injury will improve Outcome: Progressing

## 2023-10-09 NOTE — Progress Notes (Signed)
  Echocardiogram 2D Echocardiogram has been performed.  Angela David 10/09/2023, 4:28 PM

## 2023-10-09 NOTE — Care Management Important Message (Signed)
 Important Message  Patient Details  Name: Angela David MRN: [id number] Date of Birth: 1940/07/10   Important Message Given:  Yes - Medicare IM     Angela David 10/09/2023, 1:46 PM

## 2023-10-10 ENCOUNTER — Inpatient Hospital Stay: Admitting: Radiology

## 2023-10-10 ENCOUNTER — Inpatient Hospital Stay

## 2023-10-10 DIAGNOSIS — R0902 Hypoxemia: Secondary | ICD-10-CM | POA: Diagnosis not present

## 2023-10-10 HISTORY — PX: IR REPLACE G-TUBE SIMPLE WO FLUORO: IMG2323

## 2023-10-10 LAB — CBC WITH DIFFERENTIAL/PLATELET
Abs Immature Granulocytes: 0.13 K/uL — ABNORMAL HIGH (ref 0.00–0.07)
Basophils Absolute: 0.1 K/uL (ref 0.0–0.1)
Basophils Relative: 1 %
Eosinophils Absolute: 0.2 K/uL (ref 0.0–0.5)
Eosinophils Relative: 2 %
HCT: 38 % (ref 36.0–46.0)
Hemoglobin: 12.2 g/dL (ref 12.0–15.0)
Immature Granulocytes: 1 %
Lymphocytes Relative: 18 %
Lymphs Abs: 1.8 K/uL (ref 0.7–4.0)
MCH: 28.2 pg (ref 26.0–34.0)
MCHC: 32.1 g/dL (ref 30.0–36.0)
MCV: 88 fL (ref 80.0–100.0)
Monocytes Absolute: 0.8 K/uL (ref 0.1–1.0)
Monocytes Relative: 8 %
Neutro Abs: 7 K/uL (ref 1.7–7.7)
Neutrophils Relative %: 70 %
Platelets: 198 K/uL (ref 150–400)
RBC: 4.32 MIL/uL (ref 3.87–5.11)
RDW: 12.8 % (ref 11.5–15.5)
WBC: 10 K/uL (ref 4.0–10.5)
nRBC: 0 % (ref 0.0–0.2)

## 2023-10-10 LAB — GLUCOSE, CAPILLARY
Glucose-Capillary: 323 mg/dL — ABNORMAL HIGH (ref 70–99)
Glucose-Capillary: 170 mg/dL — ABNORMAL HIGH (ref 70–99)
Glucose-Capillary: 235 mg/dL — ABNORMAL HIGH (ref 70–99)
Glucose-Capillary: 121 mg/dL — ABNORMAL HIGH (ref 70–99)

## 2023-10-10 LAB — BASIC METABOLIC PANEL WITH GFR
Anion gap: 14 (ref 5–15)
BUN: 26 mg/dL — ABNORMAL HIGH (ref 8–23)
CO2: 24 mmol/L (ref 22–32)
Calcium: 9.4 mg/dL (ref 8.9–10.3)
Chloride: 102 mmol/L (ref 98–111)
Creatinine, Ser: 0.96 mg/dL (ref 0.44–1.00)
GFR, Estimated: 60 mL/min — ABNORMAL LOW (ref >60)
Glucose, Bld: 260 mg/dL — ABNORMAL HIGH (ref 70–99)
Potassium: 4.4 mmol/L (ref 3.5–5.1)
Sodium: 140 mmol/L (ref 135–145)

## 2023-10-10 MED ORDER — POLYETHYLENE GLYCOL 3350 17 G PO PACK: 17.0000 g | PACK | Freq: Every day | ORAL | Status: DC | PRN

## 2023-10-10 MED ORDER — IOHEXOL 300 MG/ML  SOLN
20.0000 mL | Freq: Once | INTRAMUSCULAR | Status: AC | PRN
  Administered 2023-10-10: 20 mL

## 2023-10-10 MED ORDER — DIPHENHYDRAMINE HCL 50 MG/ML IJ SOLN
12.5000 mg | Freq: Once | INTRAMUSCULAR | Status: AC
  Administered 2023-10-10: 12.5 mg via INTRAVENOUS
  Filled 2023-10-10: qty 1

## 2023-10-10 MED ORDER — MECLIZINE HCL 25 MG PO TABS: 12.5000 mg | ORAL_TABLET | Freq: Three times a day (TID) | ORAL | Status: DC | PRN

## 2023-10-10 MED ORDER — INSULIN GLARGINE-YFGN 100 UNIT/ML ~~LOC~~ SOLN: 15.0000 [IU] | Freq: Two times a day (BID) | SUBCUTANEOUS | Status: DC

## 2023-10-10 MED ORDER — INSULIN GLARGINE-YFGN 100 UNIT/ML ~~LOC~~ SOLN
25.0000 [IU] | Freq: Every day | SUBCUTANEOUS | Status: DC
  Administered 2023-10-10 – 2023-10-15 (×6): 25 [IU] via SUBCUTANEOUS
  Filled 2023-10-10 (×6): qty 0.25

## 2023-10-10 MED ORDER — SENNOSIDES-DOCUSATE SODIUM 8.6-50 MG PO TABS: 1.0000 | ORAL_TABLET | Freq: Two times a day (BID) | ORAL | Status: DC | PRN

## 2023-10-10 NOTE — Plan of Care (Signed)
  Problem: Coping: Goal: Ability to adjust to condition or change in health will improve Outcome: Progressing   Problem: Health Behavior/Discharge Planning: Goal: Ability to identify and utilize available resources and services will improve Outcome: Progressing Goal: Ability to manage health-related needs will improve Outcome: Progressing   Problem: Metabolic: Goal: Ability to maintain appropriate glucose levels will improve Outcome: Progressing   Problem: Tissue Perfusion: Goal: Adequacy of tissue perfusion will improve Outcome: Progressing   Problem: Education: Goal: Knowledge of General Education information will improve Description: Including pain rating scale, medication(s)/side effects and non-pharmacologic comfort measures Outcome: Progressing   Problem: Clinical Measurements: Goal: Ability to maintain clinical measurements within normal limits will improve Outcome: Progressing Goal: Will remain free from infection Outcome: Progressing Goal: Diagnostic test results will improve Outcome: Progressing Goal: Respiratory complications will improve Outcome: Progressing Goal: Cardiovascular complication will be avoided Outcome: Progressing   Problem: Activity: Goal: Risk for activity intolerance will decrease Outcome: Progressing   Problem: Coping: Goal: Level of anxiety will decrease Outcome: Progressing   Problem: Elimination: Goal: Will not experience complications related to urinary retention Outcome: Progressing   Problem: Pain Managment: Goal: General experience of comfort will improve and/or be controlled Outcome: Progressing   Problem: Safety: Goal: Ability to remain free from injury will improve Outcome: Progressing   Problem: Fluid Volume: Goal: Ability to maintain a balanced intake and output will improve Outcome: Not Progressing   Problem: Nutritional: Goal: Maintenance of adequate nutrition will improve Outcome: Not Progressing   Problem:  Skin Integrity: Goal: Risk for impaired skin integrity will decrease Outcome: Not Progressing   Problem: Nutrition: Goal: Adequate nutrition will be maintained Outcome: Not Progressing   Problem: Elimination: Goal: Will not experience complications related to bowel motility Outcome: Not Progressing   Problem: Skin Integrity: Goal: Risk for impaired skin integrity will decrease Outcome: Not Progressing

## 2023-10-10 NOTE — Progress Notes (Signed)
 MD Djan gave order via secure chat for patient to travel without RN to IR. Patient Aox4, Vitals WDL, patient able to sign consent in IR-gave report to Spencer in IR. Patient made aware by this RN regarding plan for patient to go to IR. Patient verbalized understanding. Tube feeds stopped at this time and patient prepared for transport to occur within next 30-45 min per [place] in IR. Patient on RA per RT transitioning from vent to RA at 1000 this morning. Saturation >98% since transition to RA.

## 2023-10-10 NOTE — Progress Notes (Signed)
 MD notified via secure chat regarding increased leakage of tube feeds from peg site since return from IR. Feeds stopped at this time to prevent further skin breakdown. Site redressed by this RN with clean, dry, intact dressing per orders. Feeds to be restarted/remain stopped pending notification from MD. Patient aware and understands the situation. Reports leakage at home similar to this amount potentially.

## 2023-10-10 NOTE — Progress Notes (Signed)
 Progress Note   Patient: Angela David FMW:[id number] DOB: 11/19/1940 DOA: 10/08/2023     2 DOS: the patient was seen and examined on 10/10/2023    Brief hospital course: From HPI Angela David is a 83 y.o. 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   Patient was feeling fine yesterday Angela David, she woke up at night complaining about  cannot breathe, denied any fever or chills no cough no chest pains.  At baseline she breathes through tracheostomy on room air and connected to ventilator at night.  She uses walker and wheelchair to ambulate but never experienced any exertional dyspnea or orthopnea before.  Denied any nauseous vomiting, she uses most feeding only for pleasure, and she has been stable on PEG tube feeding.   ED Course: Afebrile, tachycardia tachypneic O2 saturation 75%, on room air and patient was put on nonrebreather mask O2 saturation improved to 100%.  Overnight ED physician and staff did multiple suctioning with copious amount of clear phlegm and O2 saturation maintained well.  Chest x-ray showed mild interstitial edema, CTA showed no PE, signs of small airway disease and diffuse bronchial wall thickening with increased soft tissue within peribronchial vascular interstitium, chronic fibrosis.  Blood work showed WBC 18.3 hemoglobin 13.8 glucose 281 BUN 26 creatinine 1.1 bicarb 22K 5.2    Assessment and Plan:    Acute on chronic hypoxic respiratory failure Acute on chronic HFpEF decompensation Chronic vocal cord dysfunction status post tracheostomy Chronic dysphagia status post PEG Chronic mild aortic stenosis, moderate pulmonary  hypertension Chronic pulmonary fibrosis -Clinical review.-Mild fluid overload, with bilateral crackles, interstitial edema on x-ray and CT Follow-up on echocardiogram -Troponin negative x 2-, no chest pain, and EKG showed no signs of ischemic changes, ACS ruled out - Other DDx, pneumonia cannot be ruled out at this point, given there is a significant increase of secretions and WBC count.   Continue ceftriaxone    PEG tube site leakage Patient did undergo repositioning by IR today  Hyperkalemia Monitor electrolyte closely   UTI Continue ceftriaxone  ceftriaxone    IDDM with hyperglycemia - SSI   HTN - Continue metoprolol    Diabetic neuropathy - Continue gabapentin    Deconditioning - PT evaluation     DVT prophylaxis: Lovenox    Code Status: Full code   Family Communication: None at bedside     Consults called: None   Subjective:  Patient did have some leaking at the PEG tube site Was repositioning by IR today Denies abdominal pain chest pain or cough  Physical Exam:   ENMT: Mucous membranes are moist. Posterior pharynx clear of any exudate or lesions.Normal dentition.  Neck: normal, supple, no masses, no thyromegaly Respiratory: Air entry decreased at the bases bilaterally Cardiovascular: Regular rate and rhythm, systolic murmur on heart base. No extremity edema. 2+ pedal pulses. No carotid bruits.  Abdomen: no tenderness, no masses palpated. No hepatosplenomegaly. Bowel sounds positive.  Musculoskeletal: no clubbing / cyanosis. No joint deformity upper and lower extremities. Good ROM, no contractures. Normal muscle tone.  Skin: no rashes, lesions, ulcers. No induration Neurologic: CN 2-12 grossly intact. Sensation intact, DTR normal. Strength 5/5 in all 4.  Psychiatric: Normal judgment and insight. Alert and oriented x 3. Normal mood.  Data Reviewed:   Chest x-ray reviewed  Vitals:   10/10/23 1300 10/10/23 1400 10/10/23 1500 10/10/23 1600  BP:  (!)  119/58 125/61 (!) 93/56  Pulse: 89 88 94 93  Resp: (!) 22 (!) 23 (!) 22 (!) 26  Temp:      TempSrc:      SpO2: 96% 95% 95% 94%  Weight:      Height:          Latest Ref Rng & Units 10/10/2023    2:51 AM 10/09/2023    3:38 AM 10/08/2023   12:39 AM  CBC  WBC 4.0 - 10.5 K/uL 10.0  9.2  18.3   Hemoglobin 12.0 - 15.0 g/dL 87.7  87.5  86.1   Hematocrit 36.0 - 46.0 % 38.0  37.5  42.8   Platelets 150 - 400 K/uL 198  190  213       Author: Drue ONEIDA Potter, MD 10/10/2023 5:43 PM  For on call review [email or web address].

## 2023-10-10 NOTE — Progress Notes (Signed)
 PT Cancellation Note  Patient Details Name: Angela David MRN: [id number] DOB: [birth year]   Cancelled Treatment:    Reason Eval/Treat Not Completed: Patient at procedure or test/unavailable Patient off unit in IR at this time. PT will re-attempt at later date/time.  [name], PT, DPT Physical Therapist - [facility]  [facility]    [name] 10/10/2023, 11:34 AM

## 2023-10-10 NOTE — Inpatient Diabetes Management (Signed)
 Inpatient Diabetes Program Recommendations  AACE/ADA: New Consensus Statement on Inpatient Glycemic Control (2015)  Target Ranges:  Prepandial:   less than 140 mg/dL      Peak postprandial:   less than 180 mg/dL (1-2 hours)      Critically ill patients:  140 - 180 mg/dL    Latest Reference Range & Units 10/09/23 07:07 10/09/23 11:05 10/09/23 11:33 10/09/23 16:19 10/09/23 21:12  Glucose-Capillary 70 - 99 mg/dL 734 (H) 772 (H)  5 units Novolog   10 units Semglee   216 (H) 165 (H)  3 units Novolog   205 (H)  2 units Novolog    (H): Data is abnormally high  Latest Reference Range & Units 10/10/23 07:43  Glucose-Capillary 70 - 99 mg/dL 676 (H)  11 units Novolog    (H): Data is abnormally high   Home DM Meds: Humalog 0-5 units TID Toujeo  25 units daily   Current Orders: Semglee  10 units Daily Novolog  Moderate Correction Scale/ SSI (0-15 units) TID AC + HS    MD- Note pt getting Tube Feeds 55c/hr.  CBGs >200.  Please consider:  1. Change current Novolog  SSI to Q4 hour coverage  2. Start Novolog  Tube Feed Coverage: Novolog  3 units Q4 hours HOLD if tube feeds HELD for any reason    --Will follow patient during hospitalization--  Adina Rudolpho Arrow RN, MSN, CDCES Diabetes Coordinator Inpatient Glycemic Control Team Team Pager: [phone] (8a-5p)

## 2023-10-10 NOTE — Progress Notes (Signed)
 Patient returned to ICU from IR with transport. Vitals WDL. Patient repositioned in bed, new tube feeds hung and restarted by this RN. Ordered meds administered by this RN. Call light within patient reach as well as all personal belongings. Lunch ordered for patient per request. Will continue to monitor closely. Feeding tube replaced with new 24 [place]. Site unremarkable. Redressed per orders and dated/signed by this RN.

## 2023-10-10 NOTE — Progress Notes (Signed)
 Per secure chat with MD, hold Tube Feeds until IR re-evaluates tube placement s/t excessive leaking.

## 2023-10-11 DIAGNOSIS — R0902 Hypoxemia: Secondary | ICD-10-CM | POA: Diagnosis not present

## 2023-10-11 LAB — GLUCOSE, CAPILLARY
Glucose-Capillary: 149 mg/dL — ABNORMAL HIGH (ref 70–99)
Glucose-Capillary: 152 mg/dL — ABNORMAL HIGH (ref 70–99)
Glucose-Capillary: 159 mg/dL — ABNORMAL HIGH (ref 70–99)
Glucose-Capillary: 159 mg/dL — ABNORMAL HIGH (ref 70–99)
Glucose-Capillary: 171 mg/dL — ABNORMAL HIGH (ref 70–99)
Glucose-Capillary: 180 mg/dL — ABNORMAL HIGH (ref 70–99)

## 2023-10-11 LAB — CBC WITH DIFFERENTIAL/PLATELET
Abs Immature Granulocytes: 0.13 K/uL — ABNORMAL HIGH (ref 0.00–0.07)
Basophils Absolute: 0.1 K/uL (ref 0.0–0.1)
Basophils Relative: 1 %
Eosinophils Absolute: 0.2 K/uL (ref 0.0–0.5)
Eosinophils Relative: 2 %
HCT: 39.7 % (ref 36.0–46.0)
Hemoglobin: 12.7 g/dL (ref 12.0–15.0)
Immature Granulocytes: 1 %
Lymphocytes Relative: 21 %
Lymphs Abs: 2.1 K/uL (ref 0.7–4.0)
MCH: 28.3 pg (ref 26.0–34.0)
MCHC: 32 g/dL (ref 30.0–36.0)
MCV: 88.4 fL (ref 80.0–100.0)
Monocytes Absolute: 0.8 K/uL (ref 0.1–1.0)
Monocytes Relative: 7 %
Neutro Abs: 7.1 K/uL (ref 1.7–7.7)
Neutrophils Relative %: 68 %
Platelets: 213 K/uL (ref 150–400)
RBC: 4.49 MIL/uL (ref 3.87–5.11)
RDW: 12.7 % (ref 11.5–15.5)
WBC: 10.4 K/uL (ref 4.0–10.5)
nRBC: 0 % (ref 0.0–0.2)

## 2023-10-11 LAB — BASIC METABOLIC PANEL WITH GFR
Anion gap: 10 (ref 5–15)
BUN: 26 mg/dL — ABNORMAL HIGH (ref 8–23)
CO2: 28 mmol/L (ref 22–32)
Calcium: 9.5 mg/dL (ref 8.9–10.3)
Chloride: 103 mmol/L (ref 98–111)
Creatinine, Ser: 0.95 mg/dL (ref 0.44–1.00)
GFR, Estimated: >60 mL/min (ref >60)
Glucose, Bld: 144 mg/dL — ABNORMAL HIGH (ref 70–99)
Potassium: 4.3 mmol/L (ref 3.5–5.1)
Sodium: 141 mmol/L (ref 135–145)

## 2023-10-11 MED ORDER — GABAPENTIN 300 MG PO CAPS
300.0000 mg | ORAL_CAPSULE | Freq: Three times a day (TID) | ORAL | Status: DC
  Administered 2023-10-11 – 2023-10-15 (×12): 300 mg
  Filled 2023-10-11 (×12): qty 1

## 2023-10-11 NOTE — Progress Notes (Signed)
  IR BRIEF PROGRESS NOTE:  General Surgery was consulted by Care Team. Unfortunately, no adequate solution could be reached, and General surgery has signed off. Per Dr. Estelita note, patient understands that her G-tube will continue to leak, and is amenable to current status, as she continues to refuse conversion to GJ tube.    IR was requested to attempt G-tube upsize to 26 Fr on Monday 8/18. Augustin Boll, RD, offered to bring over a 26 Fr G-tube from [facility] to [facility] for this purpose, as IR does not stock this size at [facility]. Dr. Karalee is agreeable to the exchange. However, it should be noted that this up-sizing may not be sufficient to prevent further leaking, and that this is simply a temporizing measure, as patient's gastrostomy track will likely continue to stretch wider than the current G-tube lumen, guaranteeing recurrence of leaking long-term.    Electronically Signed: Carlin DELENA Griffon, PA-C 10/11/2023, 3:25 PM

## 2023-10-11 NOTE — Progress Notes (Signed)
 Progress Note   Patient: Angela David FMW:[id number] DOB: [birth year] DOA: 10/08/2023     3 DOS: the patient was seen and examined on 10/11/2023     Brief hospital course: From HPI Angela David is a 83 y.o. 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   Patient was feeling fine yesterday Angela David, she woke up at night complaining about  cannot breathe, denied any fever or chills no cough no chest pains.  At baseline she breathes through tracheostomy on room air and connected to ventilator at night.  She uses walker and wheelchair to ambulate but never experienced any exertional dyspnea or orthopnea before.  Denied any nauseous vomiting, she uses most feeding only for pleasure, and she has been stable on PEG tube feeding.   ED Course: Afebrile, tachycardia tachypneic O2 saturation 75%, on room air and patient was put on nonrebreather mask O2 saturation improved to 100%.  Overnight ED physician and staff did multiple suctioning with copious amount of clear phlegm and O2 saturation maintained well.  Chest x-ray showed mild interstitial edema, CTA showed no PE, signs of small airway disease and diffuse bronchial wall thickening with increased soft tissue within peribronchial vascular interstitium, chronic fibrosis.  Blood work showed WBC 18.3 hemoglobin 13.8 glucose 281 BUN 26 creatinine 1.1 bicarb 22K 5.2    Assessment and Plan:    Acute on chronic hypoxic respiratory failure Acute on chronic HFpEF decompensation Chronic vocal cord dysfunction status post tracheostomy Chronic dysphagia status post PEG Chronic mild aortic stenosis, moderate pulmonary  hypertension Chronic pulmonary fibrosis -Clinical review.-Mild fluid overload, with bilateral crackles, interstitial edema on x-ray and CT Follow-up on echocardiogram -Troponin negative x 2-, no chest pain, and EKG showed no signs of ischemic changes, ACS ruled out - Other DDx, pneumonia cannot be ruled out at this point, given there is a significant increase of secretions and WBC count.   Continue ceftriaxone    PEG tube site leakage Patient did undergo repositioning by IR on 10/10/2023 Tube is still leaking and so IR plans to handle this again on Monday   Hyperkalemia Monitor electrolyte closely   UTI Continue ceftriaxone    IDDM with hyperglycemia - SSI   HTN - Continue metoprolol    Diabetic neuropathy - Continue gabapentin    Deconditioning - PT evaluation     DVT prophylaxis: Lovenox    Code Status: Full code   Family Communication: None at bedside     Consults called: None   Subjective:  Patient still having leaking around the PEG tube site I have discussed with IR as well as general surgery I have plans to replace patient's PEG tube on Monday Denies worsening shortness of breath   Physical Exam:   ENMT: Mucous membranes are moist. Posterior pharynx clear of any exudate or lesions.Normal dentition.  Neck: normal, supple, no masses, no thyromegaly Respiratory: Air entry decreased at the bases bilaterally Cardiovascular: Regular rate and rhythm, systolic murmur on heart base. No extremity edema. 2+ pedal pulses. No carotid bruits.  Abdomen: no tenderness, no masses palpated. No hepatosplenomegaly. Bowel sounds positive.  Musculoskeletal: no clubbing / cyanosis. No joint deformity upper and lower extremities. Good ROM, no contractures. Normal muscle tone.  Skin: no rashes, lesions, ulcers. No induration Neurologic: CN 2-12  grossly intact. Sensation intact, DTR normal. Strength 5/5 in all 4.  Psychiatric: Normal judgment and insight. Alert and oriented x 3. Normal  mood.      Data Reviewed:   Chest x-ray reviewed    Vitals:   10/11/23 1300 10/11/23 1400 10/11/23 1500 10/11/23 1600  BP:  122/64  (!) 110/50  Pulse: 86 87 91 92  Resp: 16 (!) 24 19 (!) 22  Temp:      TempSrc:      SpO2: 96% 95% 92% 94%  Weight:      Height:          Latest Ref Rng & Units 10/11/2023    2:53 AM 10/10/2023    2:51 AM 10/09/2023    3:38 AM  BMP  Glucose 70 - 99 mg/dL 855  739  735   BUN 8 - 23 mg/dL 26  26  30    Creatinine 0.44 - 1.00 mg/dL 9.04  9.03  8.87   Sodium 135 - 145 mmol/L 141  140  139   Potassium 3.5 - 5.1 mmol/L 4.3  4.4  4.2   Chloride 98 - 111 mmol/L 103  102  103   CO2 22 - 32 mmol/L 28  24  25    Calcium  8.9 - 10.3 mg/dL 9.5  9.4  9.4      Author: Drue ONEIDA Potter, MD 10/11/2023 5:31 PM  For on call review [email or web address].

## 2023-10-11 NOTE — Progress Notes (Signed)
 Physical Therapy Treatment Patient Details Name: Angela David MRN: [id number] DOB: Mar 30, 1940 Today's Date: 10/11/2023   History of Present Illness 83 y/o female presented to ED on 10/08/23 for sudden onset of SOB. Admitted for acute on chronic hypoxic respiratory failure. 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 She is A and O x 4. Good historian of past medical history. Pt seems close to her baseline abilities. Tolerated getting OOB to R side and stood to RW. Took a few step to steps from EOB to recliner with min assist. Pt tolerated well. Recommended pt focus on stretching LLE ankle to prevent contractures.Pt states understanding. HHPT recommended at DC to maximize independence and safety with all ADLs.       If plan is discharge home, recommend the following: A little help with walking and/or transfers;A little help with bathing/dressing/bathroom;Assistance with cooking/housework;Assist for transportation;Help with stairs or ramp for entrance     Equipment Recommendations  None recommended by PT       Precautions / Restrictions Precautions Precautions: Fall Recall of Precautions/Restrictions: Intact Precaution/Restrictions Comments: trach, PEG Restrictions Weight Bearing Restrictions Per Provider Order: No     Mobility  Bed Mobility Overal bed mobility: Needs Assistance Bed Mobility: Supine to Sit  Supine to sit: Min assist  General bed mobility comments: min assist with vcs for improved technique    Transfers Overall transfer level: Needs assistance Equipment used: Rolling walker (2 wheels) Transfers: Sit to/from Stand Sit to Stand: Contact guard assist, Min assist  General transfer comment: Due to pt's height (ICU bed)  author blocks feet to prevent feet sliding fwd.    Ambulation/Gait Ambulation/Gait assistance: Contact guard assist Gait Distance (Feet): 3 Feet Assistive device: Rolling walker (2 wheels) Gait Pattern/deviations: Step-to pattern Gait velocity: decreased  General Gait Details: Pt was able to clear BLEs to take steps from EOB to recliner. LLE weaker than RLE. enncouraged pt to perform stretching and AROM LLE    Balance Overall balance assessment: Needs assistance Sitting-balance support: No upper extremity supported, Feet supported Sitting balance-Leahy Scale: Good     Standing balance support: Bilateral upper extremity supported, During functional activity, Reliant on assistive device for balance Standing balance-Leahy Scale: Good       Communication Communication Communication: No apparent difficulties  Cognition Arousal: Alert Behavior During Therapy: WFL for tasks assessed/performed   PT - Cognitive impairments: No apparent impairments    PT - Cognition Comments: Pt is A and O x 4. Good awareness of baseline abilities. Following commands: Intact      Cueing Cueing Techniques: Verbal cues         Pertinent Vitals/Pain Pain Assessment Pain Assessment: 0-10 Pain Score: 4  Pain Location: LLE (chronic) Pain Descriptors / Indicators: Aching Pain Intervention(s): Limited activity within patient's tolerance, Monitored during session, Premedicated before session, Repositioned     PT Goals (current goals can now be found in the care plan section) Acute Rehab PT Goals Patient Stated Goal: go home when safe Progress towards PT goals: Progressing toward goals    Frequency    Min 2X/week       AM-PAC PT 6 Clicks Mobility   Outcome Measure  Help needed turning from your back to your side while in a flat bed without using bedrails?: A Little  Help needed moving from lying on your back to sitting on the side of a flat bed without using bedrails?: A Little Help  needed moving to and from a bed to a chair (including a wheelchair)?: A Little Help needed standing up from a chair using your arms (e.g., wheelchair or bedside chair)?: A Little Help needed to walk in hospital room?: A Lot Help needed climbing 3-5 steps with a railing? : A Lot 6 Click Score: 16    End of Session   Activity Tolerance: Patient tolerated treatment well Patient left: in chair;with call bell/phone within reach;with nursing/sitter in room Nurse Communication: Mobility status PT Visit Diagnosis: Unsteadiness on feet (R26.81);Other abnormalities of gait and mobility (R26.89);Muscle weakness (generalized) (M62.81)     Time: 8869-8856 PT Time Calculation (min) (ACUTE ONLY): 13 min  Charges:    $Therapeutic Activity: 8-22 mins PT General Charges $$ ACUTE PT VISIT: 1 Visit                    Rankin Essex PTA 10/11/23, 12:36 PM

## 2023-10-11 NOTE — Consult Note (Addendum)
 Subjective:   CC: Leaking G-tube  HPI:  Angela David is a 83 y.o. female who is consulted by [facility] for evaluation of above cc.    Longstanding issue with leakage around the G-tube.  Evaluated by general surgery service couple months ago extensive revision will be extremely risky due to her comorbidities.  Interventional radiology has been managing the tube for a while and is recommending replacement with the GJ to minimize the amount of input into the stomach, to see if that will slow down the leakage around the tube.  Patient has been refusing so far.  Surgery requested to discuss possible alternatives or other ways of minimizing leak.   Past Medical History:  has a past medical history of Breast cancer, right (HCC) (2002), Chronic respiratory failure (HCC), Diabetic peripheral neuropathy (HCC) (08/28/2016), GERD (gastroesophageal reflux disease), Glaucoma, High cholesterol, History of colon polyps, Osteoarthritis, Personal history of chemotherapy (2002), Personal history of radiation therapy (2002), S/P percutaneous endoscopic gastrostomy (PEG) tube placement (HCC), T2DM (type 2 diabetes mellitus) (HCC), Tracheostomy dependent (HCC) (2012), Ventral hernia, and Vocal cord paralysis (2012).  Past Surgical History:  has a past surgical history that includes Abdominal hysterectomy; Colonoscopy (2009,12/30/2013); Tracheal surgery (2013); Breast surgery (Right, 2002); Tracheostomy (N/A); Cholecystectomy (N/A, 12/10/2017); Colonoscopy with propofol  (N/A, 04/16/2018); Esophagogastroduodenoscopy (egd) with propofol  (N/A, 10/14/2018); Colon resection (Right, 11/06/2018); Breast lumpectomy (Right, 2002); Breast biopsy (Right, 2008); Breast excisional biopsy (Right, 2002); Breast biopsy (Right, 06/30/2020); Cataract extraction, bilateral; Breast lumpectomy with radiofrequency tag identification (Right, 10/18/2020); XI robotic assisted ventral hernia (N/A, 10/18/2020); IR Replc Gastro/Colonic Tube Percut  W/Fluoro (11/24/2021); IR REPLACE G-TUBE SIMPLE WO FLUORO (11/29/2021); IR GASTR TUBE CONVERT GASTR-JEJ PER W/FL MOD SED (01/10/2022); [name] Tube Change (07/30/2022); IR Replc Gastro/Colonic Tube Percut W/Fluoro (05/02/2023); and IR REPLACE G-TUBE SIMPLE WO FLUORO (10/10/2023).  Family History: family history includes Cancer in her brother, father, and sister.  Social History:  reports that she has never smoked. She has been exposed to tobacco smoke. She has never used smokeless tobacco. She reports that she does not drink alcohol  and does not use drugs.  Current Medications:  Prior to Admission medications   Medication Sig Start Date End Date Taking? Authorizing Provider  acetaminophen  (TYLENOL ) 500 MG tablet Take 500-1,000 mg by mouth every 6 (six) hours as needed.   Yes [provider]  buPROPion  (WELLBUTRIN  SR) 150 MG 12 hr tablet Take 150 mg by mouth 2 (two) times daily. 11/13/21  Yes [provider]  Cholecalciferol  50 MCG (2000 UT) CAPS Take 2,000 Units by mouth daily.   Yes [provider]  clotrimazole (LOTRIMIN) 1 % cream APPLY A THIN LAYER TO THE AFFECTED AREA 2 TIMES DAILY   Yes [provider]  diphenhydrAMINE  (BENADRYL ) 25 mg capsule Take 1 capsule (25 mg total) by mouth every 6 (six) hours as needed for allergies or itching. 03/06/21  Yes Shellia Mann D, NP  EPINEPHrine  0.3 mg/0.3 mL IJ SOAJ injection Inject 0.3 mLs (0.3 mg dose) into the muscle once as needed for Anaphylaxis for up to 1 dose. 09/10/18  Yes [provider]  escitalopram  (LEXAPRO ) 20 MG tablet TAKE 1 TABLET VIA TUBE DAILY. TAKE ALONG WITH 10 MG DOSE. 11/13/21  Yes [provider]  estradiol  (ESTRACE ) 0.1 MG/GM vaginal cream Insert 1 applicatorful twice a week by vaginal route for 30 days.   Yes [provider]  ezetimibe  (ZETIA ) 10 MG tablet Take 10 mg by mouth daily. 11/20/21  Yes [provider]  folic acid  (FOLVITE ) 1 MG tablet Take 1 tablet by mouth  daily. 12/21/19  Yes [provider]  gabapentin  (NEURONTIN ) 300 MG capsule Place 300 mg into feeding tube every 8 (eight) hours.   Yes [provider]  Gabapentin , Once-Daily, 300 MG TABS 300 mg by Enteral route.  Take 300 mg by G tube 3 (three) times a day.   Yes [provider]  HUMALOG KWIKPEN 100 UNIT/ML KwikPen Inject 0-5 Units into the skin 3 x daily with food. 11/06/21  Yes [provider]  ipratropium-albuterol  (DUONEB) 0.5-2.5 (3) MG/3ML SOLN Take 3 mLs by nebulization every 6 (six) hours as needed. 05/09/23  Yes Kasa, Kurian, MD  loratadine  (CLARITIN ) 10 MG tablet Place 1 tablet (10 mg total) into feeding tube daily. 03/07/21  Yes Shellia Mann D, NP  meclizine  (ANTIVERT ) 12.5 MG tablet Take 12.5 mg by mouth 3 (three) times daily as needed for dizziness.   Yes [provider]  Melatonin 5 MG CAPS Take by mouth.   Yes [provider]  metoprolol  tartrate (LOPRESSOR ) 25 MG tablet Place 1 tablet (25 mg total) into feeding tube daily. Patient taking differently: Place 12.5 mg into feeding tube 2 (two) times daily. 03/07/21  Yes Shellia Mann D, NP  pantoprazole  (PROTONIX ) 40 MG tablet Take 40 mg by mouth daily. 11/10/21  Yes [provider]  Propylene Glycol (SYSTANE BALANCE) 0.6 % SOLN Place 1 drop into both eyes 2 (two) times daily.   Yes [provider]  rOPINIRole  (REQUIP ) 0.5 MG tablet Take 0.5 mg by mouth at bedtime. 10/27/21  Yes [provider]  sodium chloride  HYPERTONIC 3 % nebulizer solution Take 4 mLs by nebulization 2 (two) times daily. 07/05/21  Yes Shellia Mann BIRCH, NP  TOUJEO  SOLOSTAR 300 UNIT/ML Solostar Pen Inject 25 Units into the skin daily. 11/06/21  Yes [provider]  Water  For Irrigation, Sterile (FREE WATER ) SOLN Place 50 mLs into feeding tube every 4 (four) hours. 03/06/21  Yes Shellia Mann BIRCH, NP  Zinc  Oxide (BAZA PROTECT MOISTURE BARRIER) 12 % CREA Apply 1 application twice a day  by topical route. 08/08/21  Yes [provider]  acetaminophen  (TYLENOL ) 325 MG tablet Place 2 tablets (650 mg total) into feeding tube every 6 (six) hours as needed for fever. 03/06/21   Shellia Mann BIRCH, NP  Continuous Blood Gluc Sensor (FREESTYLE LIBRE 2 SENSOR) MISC 1 Device by Does not apply route daily. 07/06/21   Keene, Jeremiah D, NP  denosumab  (PROLIA ) 60 MG/ML SOSY injection Inject 1 mL by subcutaneous route.    [provider]  hydrOXYzine  (ATARAX ) 25 MG tablet Place 25 mg into feeding tube 2 (two) times daily. Patient not taking: Reported on 10/08/2023    [provider]  Insulin  Pen Needle (PEN NEEDLES 3/16) 31G X 5 MM MISC 1 Container by Does not apply route daily. 07/06/21   Shellia Mann BIRCH, NP  lovastatin  (MEVACOR ) 20 MG tablet Take 20 mg by mouth at bedtime. Patient not taking: Reported on 10/08/2023    [provider]  Multiple Vitamin (MULTIVITAMIN) LIQD Place 15 mLs into feeding tube daily. Patient not taking: Reported on 10/08/2023 03/07/21   Shellia Mann BIRCH, NP  Nutritional Supplements (FEEDING SUPPLEMENT, KATE FARMS STANDARD 1.4,) LIQD liquid Place 1,440 mLs into feeding tube daily. 03/07/21   Shellia Mann BIRCH, NP  Vibegron  (GEMTESA ) 75 MG TABS Take 1 tablet (75 mg total) by mouth daily. Patient not taking: Reported on 10/08/2023 05/24/22  Vaillancourt, Samantha, PA-C    Allergies:  Allergies as of 10/08/2023 - Review Complete 10/08/2023  Allergen Reaction Noted   Shellfish allergy Shortness Of Breath 07/25/2012   Sodium sulfite Shortness Of Breath 12/14/2013   Sulfa antibiotics Shortness Of Breath and Other (See Comments) 07/25/2012   Codeine Other (See Comments) 11/26/2012   Glucerna [alimentum] Itching 03/29/2020   Wound dressing adhesive Rash 02/20/2021    ROS:  Pertinent positives and negatives noted in HPI   Objective:     BP 121/65   Pulse 95   Temp 99.5 F (37.5 C) (Oral)   Resp 18   Ht 5' 5 (1.651 m)   Wt 76.8 kg    SpO2 93%   BMI 28.18 kg/m    Constitutional :  alert, cooperative, appears stated age, and no distress  Respiratory:  Clear to auscultation bilaterally  Cardiovascular:  Regular rate and rhythm  Gastrointestinal: soft, non-tender; bowel sounds normal; no masses,  no organomegaly.  G-tube dressing intact with no obvious signs of infection or issues around the tube itself  Skin: Cool and moist  Psychiatric: Normal affect, non-agitated, not confused       LABS:     Latest Ref Rng & Units 10/11/2023    2:53 AM 10/10/2023    2:51 AM 10/09/2023    3:38 AM  CMP  Glucose 70 - 99 mg/dL 855  739  735   BUN 8 - 23 mg/dL 26  26  30    Creatinine 0.44 - 1.00 mg/dL 9.04  9.03  8.87   Sodium 135 - 145 mmol/L 141  140  139   Potassium 3.5 - 5.1 mmol/L 4.3  4.4  4.2   Chloride 98 - 111 mmol/L 103  102  103   CO2 22 - 32 mmol/L 28  24  25    Calcium  8.9 - 10.3 mg/dL 9.5  9.4  9.4       Latest Ref Rng & Units 10/11/2023    2:53 AM 10/10/2023    2:51 AM 10/09/2023    3:38 AM  CBC  WBC 4.0 - 10.5 K/uL 10.4  10.0  9.2   Hemoglobin 12.0 - 15.0 g/dL 87.2  87.7  87.5   Hematocrit 36.0 - 46.0 % 39.7  38.0  37.5   Platelets 150 - 400 K/uL 213  198  190      RADS: N/a  Assessment:      Leakage around G-tube  Plan:     Chart and imaging review completed.  I agree with the attempt at replacing current G-tube with the GJ tube to minimize feeds sitting in the stomach to see if that can decrease the amount of leakage.  Patient again is adamantly refusing the replacement, stating that she is concerned that that GJ tube may become dislodged inside her.  I did explain to her that the likelihood of complication such as perforation will not be significantly higher with a G J-tube compared to her current G-tube.  She specifically verbalized understanding of that but still is refusing an exchange.  She specifically stated that she will just deal with her current situation as is rather than attempting a  conversion.  Increasing tube size may temporarily decrease the leakage, but it is likely that the tract will only get bigger and it will become a recurrent issue.  Further care per primary team.  Surgery will be available for any additional questions or concerns.  The patient verbalized understanding and all questions were  answered to the patient's satisfaction.  labs/images/medications/previous chart entries reviewed personally and relevant changes/updates noted above.

## 2023-10-11 NOTE — TOC Progression Note (Signed)
 Transition of Care [facility]) - Discharge Note   Patient Details  Name: Angela David MRN: [id number] Date of Birth: 01/09/1941  Transition of Care [facility]) CM/SW Contact:  Corrie JINNY Ruts, LCSW Phone Number: 10/11/2023, 3:45 PM   Clinical Narrative:    Chart reviewed. I spoke with [facility] and they confirmed that they are unable to accept the patient with a trach and even if the ventilator is only used at night.   Will follow up with patient to consult about other levels of care.      Barriers to Discharge: Continued Medical Work up   Patient Goals and CMS Choice            Discharge Placement                       Discharge Plan and Services Additional resources added to the After Visit Summary for                                       Social Drivers of Health (SDOH) Interventions SDOH Screenings   Food Insecurity: No Food Insecurity (10/08/2023)  Housing: Low Risk  (10/08/2023)  Transportation Needs: No Transportation Needs (10/08/2023)  Utilities: Not At Risk (10/08/2023)  Depression (PHQ2-9): Low Risk  (07/12/2022)  Financial Resource Strain: Low Risk  (05/16/2023)   Received from [facility]  Physical Activity: Inactive (07/08/2020)   Received from [facility]  Social Connections: Unknown (10/08/2023)  Stress: Stress Concern Present (07/08/2020)   Received from [facility]  Tobacco Use: Low Risk  (10/08/2023)     Readmission Risk Interventions    10/09/2023    3:31 PM 06/30/2021    2:19 PM 02/23/2021   12:23 PM  Readmission Risk Prevention Plan  Transportation Screening Complete Complete Complete  PCP or Specialist Appt within 3-5 Days Complete  Complete  HRI or Home Care Consult   Complete  Social Work Consult for Recovery Care Planning/Counseling Complete  Complete  Palliative Care Screening Not Applicable  Not Applicable  Medication Review [employer]) Complete Complete Complete  PCP or Specialist appointment  within 3-5 days of discharge  Complete   HRI or Home Care Consult  Complete   SW Recovery Care/Counseling Consult  Complete   Palliative Care Screening  Complete   Skilled Nursing Facility  Not Applicable

## 2023-10-11 NOTE — Progress Notes (Signed)
  IR BRIEF PROGRESS NOTE:  IR was requested to re-evaluate Angela David's G-tube again today, as it continues to leak. IR has attempted to manipulate patient's G-tube on 8/13, and underwent an exchange yesterday as well, due to persistent leaking. After each manipulation, the G-tube is noted correctly in place, and not leaking in the moment. However, leaking eventually returns, as the gastrostomy track is larger that the G-tube. Patient was offered conversion again from G to GJ by Dr. Hughes yesterday (IR attending). Patient adamantly refused. IR does not have a larger lumen G-tube to exchange at this time.   Unfortunately, IR does not have any further recommendations to prevent ongoing leaking. Per Dr. Karalee, IR attending today, patient will likely benefit from General surgery consult going forward for a more definitive solution. Dr. Dorinda was made aware. IR will sign off.    Electronically Signed: Carlin DELENA Griffon, PA-C 10/11/2023, 1:43 PM

## 2023-10-12 DIAGNOSIS — R0902 Hypoxemia: Secondary | ICD-10-CM | POA: Diagnosis not present

## 2023-10-12 LAB — CBC WITH DIFFERENTIAL/PLATELET
Abs Immature Granulocytes: 0.17 K/uL — ABNORMAL HIGH (ref 0.00–0.07)
Basophils Absolute: 0.1 K/uL (ref 0.0–0.1)
Basophils Relative: 1 %
Eosinophils Absolute: 0.2 K/uL (ref 0.0–0.5)
Eosinophils Relative: 2 %
HCT: 38.3 % (ref 36.0–46.0)
Hemoglobin: 12.5 g/dL (ref 12.0–15.0)
Immature Granulocytes: 2 %
Lymphocytes Relative: 16 %
Lymphs Abs: 1.7 K/uL (ref 0.7–4.0)
MCH: 28.2 pg (ref 26.0–34.0)
MCHC: 32.6 g/dL (ref 30.0–36.0)
MCV: 86.5 fL (ref 80.0–100.0)
Monocytes Absolute: 0.8 K/uL (ref 0.1–1.0)
Monocytes Relative: 7 %
Neutro Abs: 7.5 K/uL (ref 1.7–7.7)
Neutrophils Relative %: 72 %
Platelets: 219 K/uL (ref 150–400)
RBC: 4.43 MIL/uL (ref 3.87–5.11)
RDW: 12.8 % (ref 11.5–15.5)
WBC: 10.4 K/uL (ref 4.0–10.5)
nRBC: 0 % (ref 0.0–0.2)

## 2023-10-12 LAB — BASIC METABOLIC PANEL WITH GFR
Anion gap: 12 (ref 5–15)
BUN: 26 mg/dL — ABNORMAL HIGH (ref 8–23)
CO2: 23 mmol/L (ref 22–32)
Calcium: 9.3 mg/dL (ref 8.9–10.3)
Chloride: 100 mmol/L (ref 98–111)
Creatinine, Ser: 0.98 mg/dL (ref 0.44–1.00)
GFR, Estimated: 58 mL/min — ABNORMAL LOW (ref >60)
Glucose, Bld: 197 mg/dL — ABNORMAL HIGH (ref 70–99)
Potassium: 4.3 mmol/L (ref 3.5–5.1)
Sodium: 135 mmol/L (ref 135–145)

## 2023-10-12 LAB — GLUCOSE, CAPILLARY
Glucose-Capillary: 218 mg/dL — ABNORMAL HIGH (ref 70–99)
Glucose-Capillary: 206 mg/dL — ABNORMAL HIGH (ref 70–99)
Glucose-Capillary: 159 mg/dL — ABNORMAL HIGH (ref 70–99)
Glucose-Capillary: 105 mg/dL — ABNORMAL HIGH (ref 70–99)
Glucose-Capillary: 162 mg/dL — ABNORMAL HIGH (ref 70–99)
Glucose-Capillary: 189 mg/dL — ABNORMAL HIGH (ref 70–99)

## 2023-10-12 NOTE — Progress Notes (Signed)
 Progress Note   Patient: Angela David FMW:[id number] DOB: [birth year] DOA: 10/08/2023     4 DOS: the patient was seen and examined on 10/12/2023    Brief hospital course: From HPI Angela David is a 83 y.o. 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   Patient was feeling fine yesterday Angela David, she woke up at night complaining about  cannot breathe, denied any fever or chills no cough no chest pains.  At baseline she breathes through tracheostomy on room air and connected to ventilator at night.  She uses walker and wheelchair to ambulate but never experienced any exertional dyspnea or orthopnea before.  Denied any nauseous vomiting, she uses most feeding only for pleasure, and she has been stable on PEG tube feeding.   ED Course: Afebrile, tachycardia tachypneic O2 saturation 75%, on room air and patient was put on nonrebreather mask O2 saturation improved to 100%.  Overnight ED physician and staff did multiple suctioning with copious amount of clear phlegm and O2 saturation maintained well.  Chest x-ray showed mild interstitial edema, CTA showed no PE, signs of small airway disease and diffuse bronchial wall thickening with increased soft tissue within peribronchial vascular interstitium, chronic fibrosis.  Blood work showed WBC 18.3 hemoglobin 13.8 glucose 281 BUN 26 creatinine 1.1 bicarb 22K 5.2    Assessment and Plan:    Acute on chronic hypoxic respiratory failure Acute on chronic HFpEF decompensation Chronic vocal cord dysfunction status post tracheostomy Chronic dysphagia status post PEG Chronic mild aortic stenosis, moderate pulmonary  hypertension Chronic pulmonary fibrosis -Clinical review.-Mild fluid overload, with bilateral crackles, interstitial edema on x-ray and CT Echo showed EF 60 to 65% -Troponin negative x 2-, no chest pain, and EKG showed no signs of ischemic changes, ACS ruled out - Other DDx, pneumonia cannot be ruled out at this point, given there is a significant increase of secretions and WBC count.   Continue ceftriaxone    PEG tube site leakage Patient did undergo repositioning by IR on 10/10/2023 Tube is still leaking and so IR plans to handle this again on Monday   Hyperkalemia Monitor electrolyte closely   UTI Continue ceftriaxone    IDDM with hyperglycemia - SSI   HTN - Continue metoprolol    Diabetic neuropathy - Continue gabapentin    Deconditioning - PT evaluation     DVT prophylaxis: Lovenox    Code Status: Full code   Family Communication: None at bedside     Consults called: None   Subjective:  Denies nausea vomiting abdominal pain chest pain IR planning PEG tube removal on Monday  Physical Exam:   ENMT: Mucous membranes are moist. Posterior pharynx clear of any exudate or lesions.Normal dentition.  Neck: normal, supple, no masses, no thyromegaly Respiratory: Air entry decreased at the bases bilaterally Cardiovascular: Regular rate and rhythm, systolic murmur on heart base. No extremity edema. 2+ pedal pulses. No carotid bruits.  Abdomen: no tenderness, no masses palpated. No hepatosplenomegaly. Bowel sounds positive.  Musculoskeletal: no clubbing / cyanosis. No joint deformity upper and lower extremities. Good ROM, no contractures. Normal muscle tone.  Skin: no rashes, lesions, ulcers. No induration Neurologic: CN 2-12 grossly intact. Sensation intact, DTR normal. Strength 5/5 in all 4.  Psychiatric: Normal judgment and insight. Alert and  oriented x 3. Normal mood.      Data Reviewed:   Chest x-ray reviewed   Vitals:   10/12/23 1000 10/12/23 1006 10/12/23 1100  10/12/23 1200  BP: (!) 123/50   (!) 122/49  Pulse: 85 85 77 80  Resp: (!) 22 19 (!) 24 (!) 23  Temp:    99.4 F (37.4 C)  TempSrc:      SpO2: 97% 94% 95% 93%  Weight:      Height:          Latest Ref Rng & Units 10/12/2023    7:21 AM 10/11/2023    2:53 AM 10/10/2023    2:51 AM  BMP  Glucose 70 - 99 mg/dL 802  855  739   BUN 8 - 23 mg/dL 26  26  26    Creatinine 0.44 - 1.00 mg/dL 9.01  9.04  9.03   Sodium 135 - 145 mmol/L 135  141  140   Potassium 3.5 - 5.1 mmol/L 4.3  4.3  4.4   Chloride 98 - 111 mmol/L 100  103  102   CO2 22 - 32 mmol/L 23  28  24    Calcium  8.9 - 10.3 mg/dL 9.3  9.5  9.4      Author: Drue ONEIDA Potter, MD 10/12/2023 4:07 PM  For on call review [email or web address].

## 2023-10-13 DIAGNOSIS — R0902 Hypoxemia: Secondary | ICD-10-CM | POA: Diagnosis not present

## 2023-10-13 LAB — CULTURE, BLOOD (ROUTINE X 2)
Culture: NO GROWTH
Culture: NO GROWTH

## 2023-10-13 LAB — GLUCOSE, CAPILLARY
Glucose-Capillary: 171 mg/dL — ABNORMAL HIGH (ref 70–99)
Glucose-Capillary: 175 mg/dL — ABNORMAL HIGH (ref 70–99)
Glucose-Capillary: 180 mg/dL — ABNORMAL HIGH (ref 70–99)
Glucose-Capillary: 195 mg/dL — ABNORMAL HIGH (ref 70–99)
Glucose-Capillary: 196 mg/dL — ABNORMAL HIGH (ref 70–99)
Glucose-Capillary: 224 mg/dL — ABNORMAL HIGH (ref 70–99)
Glucose-Capillary: 232 mg/dL — ABNORMAL HIGH (ref 70–99)

## 2023-10-13 NOTE — TOC CM/SW Note (Addendum)
..  Transition of Care [facility]) - Inpatient Brief Assessment   Patient Details  Name: Angela David MRN: [id number] Date of Birth: [birth year]  Transition of Care [facility]) CM/SW Contact:    Edsel DELENA Fischer, LCSW Phone Number: 10/13/2023, 4:14 PM   Clinical Narrative:  SW met with pt at bedside.  Pt husband was present as well. Pt gave verbal permission for sw to speak with husband.  SW inform pt and husband that [facility] denied [facility] request because they cannot provide trach care.  Husband asked SW to try [facility].  SW referred pt to [facility].  Waiting on response.  SW asked pt and husband if they have another preference.  Pt stated that pt used [facility] in the past.  SW expressed that [facility] does not accept referrals from our [facility] team/ hospital however, sw can reach out to pt PCP- Dr. Ozell Gal and have PCP make referral to [facility] if [facility] is not able to accept referral.   4:49pm- SW received  response from [place].  [facility] will not be able to provide [facility] support to pt.  TOC to follow up with PCP on Monday  Transition of Care Asessment:

## 2023-10-13 NOTE — Progress Notes (Signed)
 Physical Therapy Treatment Patient Details Name: Angela David MRN: [id number] DOB: [birth year] Today's Date: 10/13/2023   History of Present Illness 83 y/o female presented to ED on 10/08/23 for sudden onset of SOB. Admitted for acute on chronic hypoxic respiratory failure. PMH: vocal cord dysfunction with chronic hypoxic respiratory failure with chronic tracheostomy and PEG, on nocturnal ventilation, moderate pulmonary HTN, breast cancer s/p lumpectomy with radiation and chemo, IDDM, diabetic neuropathy    PT Comments  Pt able to balance at EOB while PT performs manual stretching for LLE d/t stiffness. Pt demonstrates min A with rolling in bed and is able to perform with min verbal cuing for hand placement. Pt continues to demonstrate generalized weakness/decreased balance and would benefit from skilled PT to address above deficits and promote optimal return to PLOF.    If plan is discharge home, recommend the following: A little help with walking and/or transfers;A little help with bathing/dressing/bathroom;Assistance with cooking/housework;Assist for transportation;Help with stairs or ramp for entrance   Can travel by private vehicle        Equipment Recommendations  None recommended by PT    Recommendations for Other Services       Precautions / Restrictions Precautions Precautions: Fall Recall of Precautions/Restrictions: Intact Precaution/Restrictions Comments: trach, PEG. Pt vocalizes using finger over trach Restrictions Weight Bearing Restrictions Per Provider Order: No     Mobility  Bed Mobility Overal bed mobility: Needs Assistance Bed Mobility: Supine to Sit     Supine to sit: Min assist Sit to supine: Min assist   General bed mobility comments: Min verbal cues for sequencing. Min physical assistance to sit at EOB.    Transfers                   General transfer comment: NT. Pt refuses sitting in recliner.    Ambulation/Gait                General Gait Details: NT. Pt recieved in bed pre and post session.   Stairs             Wheelchair Mobility     Tilt Bed    Modified Rankin (Stroke Patients Only)       Balance Overall balance assessment: Needs assistance Sitting-balance support: Bilateral upper extremity supported, Feet unsupported Sitting balance-Leahy Scale: Fair Sitting balance - Comments: Pt demontrates slight posterior lean while sitting at EOB with feet unsupported. Min physical assistance to maintain seated balance when fatigued. Postural control: Posterior lean                                  Communication Communication Communication: No apparent difficulties Factors Affecting Communication: Trach/intubated;Other (comment) (Pt uses finger over trach to speak.)  Cognition Arousal: Alert Behavior During Therapy: WFL for tasks assessed/performed   PT - Cognitive impairments: No apparent impairments                       PT - Cognition Comments: Pt is pleasant and agreeable to PT. Following commands: Intact      Cueing Cueing Techniques: Verbal cues  Exercises Other Exercises Other Exercises: Manual stretching for LLE d/t stiffness.    General Comments        Pertinent Vitals/Pain Pain Assessment Pain Assessment: No/denies pain    Home Living  Prior Function            PT Goals (current goals can now be found in the care plan section) Acute Rehab PT Goals Patient Stated Goal: to go home PT Goal Formulation: With patient Time For Goal Achievement: 10/23/23 Potential to Achieve Goals: Good Progress towards PT goals: Progressing toward goals    Frequency    Min 2X/week      PT Plan      Co-evaluation              AM-PAC PT 6 Clicks Mobility   Outcome Measure  Help needed turning from your back to your side while in a flat bed without using bedrails?: A Little Help needed moving from lying on  your back to sitting on the side of a flat bed without using bedrails?: A Little Help needed moving to and from a bed to a chair (including a wheelchair)?: A Little Help needed standing up from a chair using your arms (e.g., wheelchair or bedside chair)?: A Little Help needed to walk in hospital room?: A Lot Help needed climbing 3-5 steps with a railing? : A Lot 6 Click Score: 16    End of Session   Activity Tolerance: Patient tolerated treatment well Patient left: in bed;with call bell/phone within reach;with family/visitor present Nurse Communication: Mobility status PT Visit Diagnosis: Unsteadiness on feet (R26.81);Other abnormalities of gait and mobility (R26.89);Muscle weakness (generalized) (M62.81)     Time: 8550-8541 PT Time Calculation (min) (ACUTE ONLY): 9 min  Charges:                            Dhanvin Szeto, SPT    Jenness Stemler 10/13/2023, 3:53 PM

## 2023-10-13 NOTE — Plan of Care (Signed)
  Problem: Coping: Goal: Ability to adjust to condition or change in health will improve Outcome: Progressing   Problem: Nutritional: Goal: Maintenance of adequate nutrition will improve Outcome: Progressing   Problem: Education: Goal: Knowledge of General Education information will improve Description: Including pain rating scale, medication(s)/side effects and non-pharmacologic comfort measures Outcome: Progressing   Problem: Clinical Measurements: Goal: Ability to maintain clinical measurements within normal limits will improve Outcome: Progressing

## 2023-10-13 NOTE — Progress Notes (Signed)
 Progress Note   Patient: Angela David FMW:[id number] DOB: [birth year] DOA: 10/08/2023     5 DOS: the patient was seen and examined on 10/13/2023     Brief hospital course: From HPI Angela David is a 83 y.o. 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   Patient was feeling fine yesterday [name], she woke up at night complaining about  cannot breathe, denied any fever or chills no cough no chest pains.  At baseline she breathes through tracheostomy on room air and connected to ventilator at night.  She uses walker and wheelchair to ambulate but never experienced any exertional dyspnea or orthopnea before.  Denied any nauseous vomiting, she uses most feeding only for pleasure, and she has been stable on PEG tube feeding.   ED Course: Afebrile, tachycardia tachypneic O2 saturation 75%, on room air and patient was put on nonrebreather mask O2 saturation improved to 100%.  Overnight ED physician and staff did multiple suctioning with copious amount of clear phlegm and O2 saturation maintained well.  Chest x-ray showed mild interstitial edema, CTA showed no PE, signs of small airway disease and diffuse bronchial wall thickening with increased soft tissue within peribronchial vascular interstitium, chronic fibrosis.  Blood work showed WBC 18.3 hemoglobin 13.8 glucose 281 BUN 26 creatinine 1.1 bicarb 22K 5.2    Assessment and Plan:    Acute on chronic hypoxic respiratory failure Acute on chronic HFpEF decompensation Chronic vocal cord dysfunction status post tracheostomy Chronic dysphagia status post PEG Chronic mild aortic stenosis, moderate pulmonary  hypertension Chronic pulmonary fibrosis -Clinical review.-Mild fluid overload, with bilateral crackles, interstitial edema on x-ray and CT Echo showed EF 60 to 65% -Troponin negative x 2-, no chest pain, and EKG showed no signs of ischemic changes, ACS ruled out - Other DDx, pneumonia cannot be ruled out at this point, given there is a significant increase of secretions and WBC count.   Continue ceftriaxone    PEG tube site leakage Patient did undergo repositioning by IR on 10/10/2023 Tube is still leaking and so IR plans to handle this again on Monday   Hyperkalemia Monitor electrolyte closely   UTI Continue ceftriaxone    IDDM with hyperglycemia - SSI   HTN - Continue metoprolol    Diabetic neuropathy - Continue gabapentin    Deconditioning - PT evaluation     DVT prophylaxis: Lovenox    Code Status: Full code   Family Communication: None at bedside     Consults called: None   Subjective:  Admits to improvement in respiratory function Denies nausea vomiting abdominal pain chest pain IR planning PEG tube removal on Monday   Physical Exam:   ENMT: Mucous membranes are moist. Posterior pharynx clear of any exudate or lesions.Normal dentition.  Neck: normal, supple, no masses, no thyromegaly Respiratory: Air entry decreased at the bases bilaterally Cardiovascular: Regular rate and rhythm, systolic murmur on heart base. No extremity edema. 2+ pedal pulses. No carotid bruits.  Abdomen: no tenderness, no masses palpated. No hepatosplenomegaly. Bowel sounds positive.  Musculoskeletal: no clubbing / cyanosis. No joint deformity upper and lower extremities. Good ROM, no contractures. Normal muscle tone.  Skin: no rashes, lesions, ulcers. No induration Neurologic: CN 2-12 grossly intact. Sensation intact, DTR normal. Strength 5/5 in all 4.  Psychiatric: Normal judgment and insight. Alert and oriented x 3. Normal mood.      Data Reviewed:   Chest x-ray reviewed       Vitals:   10/13/23 1200 10/13/23 1300 10/13/23 1400 10/13/23 1500  BP: (!) 118/50 (!) 117/55  (!) 118/42  Pulse: 80 79 80 84  Resp: 15 (!) 21 (!) 29 (!) 26  Temp: 98.8 F (37.1 C)     TempSrc: Oral     SpO2: 98% 100% 99% 94%  Weight:      Height:          Latest Ref Rng & Units 10/12/2023    7:21 AM 10/11/2023    2:53 AM 10/10/2023    2:51 AM  CBC  WBC 4.0 - 10.5 K/uL 10.4  10.4  10.0   Hemoglobin 12.0 - 15.0 g/dL 87.4  87.2  87.7   Hematocrit 36.0 - 46.0 % 38.3  39.7  38.0   Platelets 150 - 400 K/uL 219  213  198        Latest Ref Rng & Units 10/12/2023    7:21 AM 10/11/2023    2:53 AM 10/10/2023    2:51 AM  BMP  Glucose 70 - 99 mg/dL 802  855  739   BUN 8 - 23 mg/dL 26  26  26    Creatinine 0.44 - 1.00 mg/dL 9.01  9.04  9.03   Sodium 135 - 145 mmol/L 135  141  140   Potassium 3.5 - 5.1 mmol/L 4.3  4.3  4.4   Chloride 98 - 111 mmol/L 100  103  102   CO2 22 - 32 mmol/L 23  28  24    Calcium  8.9 - 10.3 mg/dL 9.3  9.5  9.4      Author: Drue ONEIDA Potter, MD 10/13/2023 5:37 PM  For on call review [email or web address].

## 2023-10-13 NOTE — Plan of Care (Signed)
  Problem: Fluid Volume: Goal: Ability to maintain a balanced intake and output will improve Outcome: Progressing   Problem: Metabolic: Goal: Ability to maintain appropriate glucose levels will improve Outcome: Progressing   Problem: Nutritional: Goal: Maintenance of adequate nutrition will improve Outcome: Progressing   Problem: Skin Integrity: Goal: Risk for impaired skin integrity will decrease Outcome: Not Progressing   Problem: Clinical Measurements: Goal: Diagnostic test results will improve Outcome: Progressing Goal: Respiratory complications will improve Outcome: Progressing Goal: Cardiovascular complication will be avoided Outcome: Progressing   Problem: Activity: Goal: Risk for activity intolerance will decrease Outcome: Progressing   Problem: Nutrition: Goal: Adequate nutrition will be maintained Outcome: Progressing   Problem: Coping: Goal: Level of anxiety will decrease Outcome: Progressing   Problem: Pain Managment: Goal: General experience of comfort will improve and/or be controlled Outcome: Progressing   Problem: Safety: Goal: Ability to remain free from injury will improve Outcome: Progressing

## 2023-10-14 ENCOUNTER — Other Ambulatory Visit: Admitting: Radiology

## 2023-10-14 DIAGNOSIS — R0902 Hypoxemia: Secondary | ICD-10-CM | POA: Diagnosis not present

## 2023-10-14 LAB — CBC WITH DIFFERENTIAL/PLATELET
Abs Immature Granulocytes: 0.19 K/uL — ABNORMAL HIGH (ref 0.00–0.07)
Basophils Absolute: 0.1 K/uL (ref 0.0–0.1)
Basophils Relative: 1 %
Eosinophils Absolute: 0.2 K/uL (ref 0.0–0.5)
Eosinophils Relative: 2 %
HCT: 36.8 % (ref 36.0–46.0)
Hemoglobin: 12.2 g/dL (ref 12.0–15.0)
Immature Granulocytes: 2 %
Lymphocytes Relative: 17 %
Lymphs Abs: 1.9 K/uL (ref 0.7–4.0)
MCH: 28.6 pg (ref 26.0–34.0)
MCHC: 33.2 g/dL (ref 30.0–36.0)
MCV: 86.2 fL (ref 80.0–100.0)
Monocytes Absolute: 0.8 K/uL (ref 0.1–1.0)
Monocytes Relative: 8 %
Neutro Abs: 7.6 K/uL (ref 1.7–7.7)
Neutrophils Relative %: 70 %
Platelets: 227 K/uL (ref 150–400)
RBC: 4.27 MIL/uL (ref 3.87–5.11)
RDW: 12.7 % (ref 11.5–15.5)
WBC: 10.8 K/uL — ABNORMAL HIGH (ref 4.0–10.5)
nRBC: 0 % (ref 0.0–0.2)

## 2023-10-14 LAB — BASIC METABOLIC PANEL WITH GFR
Anion gap: 11 (ref 5–15)
BUN: 28 mg/dL — ABNORMAL HIGH (ref 8–23)
CO2: 24 mmol/L (ref 22–32)
Calcium: 9.3 mg/dL (ref 8.9–10.3)
Chloride: 103 mmol/L (ref 98–111)
Creatinine, Ser: 0.9 mg/dL (ref 0.44–1.00)
GFR, Estimated: >60 mL/min (ref >60)
Glucose, Bld: 169 mg/dL — ABNORMAL HIGH (ref 70–99)
Potassium: 4.1 mmol/L (ref 3.5–5.1)
Sodium: 138 mmol/L (ref 135–145)

## 2023-10-14 LAB — GLUCOSE, CAPILLARY
Glucose-Capillary: 174 mg/dL — ABNORMAL HIGH (ref 70–99)
Glucose-Capillary: 153 mg/dL — ABNORMAL HIGH (ref 70–99)
Glucose-Capillary: 134 mg/dL — ABNORMAL HIGH (ref 70–99)
Glucose-Capillary: 92 mg/dL (ref 70–99)
Glucose-Capillary: 178 mg/dL — ABNORMAL HIGH (ref 70–99)
Glucose-Capillary: 208 mg/dL — ABNORMAL HIGH (ref 70–99)

## 2023-10-14 MED ORDER — DIPHENHYDRAMINE HCL 25 MG PO CAPS
25.0000 mg | ORAL_CAPSULE | Freq: Four times a day (QID) | ORAL | Status: DC | PRN
  Administered 2023-10-14: 25 mg
  Filled 2023-10-14: qty 1

## 2023-10-14 MED ORDER — PANTOPRAZOLE SODIUM 40 MG PO TBEC
40.0000 mg | DELAYED_RELEASE_TABLET | Freq: Every day | ORAL | Status: DC
  Administered 2023-10-15: 40 mg via ORAL
  Filled 2023-10-14: qty 1

## 2023-10-14 MED ORDER — PRAVASTATIN SODIUM 20 MG PO TABS
20.0000 mg | ORAL_TABLET | Freq: Every day | ORAL | Status: DC
  Administered 2023-10-14: 20 mg

## 2023-10-14 NOTE — Plan of Care (Signed)
  Problem: Coping: Goal: Ability to adjust to condition or change in health will improve Outcome: Progressing   Problem: Fluid Volume: Goal: Ability to maintain a balanced intake and output will improve Outcome: Progressing   Problem: Health Behavior/Discharge Planning: Goal: Ability to identify and utilize available resources and services will improve Outcome: Progressing Goal: Ability to manage health-related needs will improve Outcome: Progressing   Problem: Metabolic: Goal: Ability to maintain appropriate glucose levels will improve Outcome: Progressing   Problem: Nutritional: Goal: Maintenance of adequate nutrition will improve Outcome: Progressing Goal: Progress toward achieving an optimal weight will improve Outcome: Progressing   Problem: Skin Integrity: Goal: Risk for impaired skin integrity will decrease Outcome: Progressing   Problem: Tissue Perfusion: Goal: Adequacy of tissue perfusion will improve Outcome: Progressing   Problem: Education: Goal: Knowledge of General Education information will improve Description: Including pain rating scale, medication(s)/side effects and non-pharmacologic comfort measures Outcome: Progressing   Problem: Clinical Measurements: Goal: Ability to maintain clinical measurements within normal limits will improve Outcome: Progressing Goal: Diagnostic test results will improve Outcome: Progressing Goal: Cardiovascular complication will be avoided Outcome: Progressing   Problem: Activity: Goal: Risk for activity intolerance will decrease Outcome: Progressing   Problem: Coping: Goal: Level of anxiety will decrease Outcome: Progressing   Problem: Elimination: Goal: Will not experience complications related to bowel motility Outcome: Progressing Goal: Will not experience complications related to urinary retention Outcome: Progressing   Problem: Safety: Goal: Ability to remain free from injury will improve Outcome:  Progressing

## 2023-10-14 NOTE — Progress Notes (Signed)
 Per IR and Dr. Dorinda, okay to give meds and restart tube feeds.

## 2023-10-14 NOTE — Progress Notes (Signed)
 Progress Note   Patient: Angela David FMW:[id number] DOB: [birth year] DOA: 10/08/2023     6 DOS: the patient was seen and examined on 10/14/2023     Brief hospital course: From HPI Angela David is a 83 y.o. 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   Patient was feeling fine yesterday Angela David, she woke up at night complaining about  cannot breathe, denied any fever or chills no cough no chest pains.  At baseline she breathes through tracheostomy on room air and connected to ventilator at night.  She uses walker and wheelchair to ambulate but never experienced any exertional dyspnea or orthopnea before.  Denied any nauseous vomiting, she uses most feeding only for pleasure, and she has been stable on PEG tube feeding.   ED Course: Afebrile, tachycardia tachypneic O2 saturation 75%, on room air and patient was put on nonrebreather mask O2 saturation improved to 100%.  Overnight ED physician and staff did multiple suctioning with copious amount of clear phlegm and O2 saturation maintained well.  Chest x-ray showed mild interstitial edema, CTA showed no PE, signs of small airway disease and diffuse bronchial wall thickening with increased soft tissue within peribronchial vascular interstitium, chronic fibrosis.  Blood work showed WBC 18.3 hemoglobin 13.8 glucose 281 BUN 26 creatinine 1.1 bicarb 22K 5.2    Assessment and Plan:    Acute on chronic hypoxic respiratory failure Acute on chronic HFpEF decompensation Chronic vocal cord dysfunction status post tracheostomy Chronic dysphagia status post PEG Chronic mild aortic stenosis, moderate pulmonary  hypertension Chronic pulmonary fibrosis -Clinical review.-Mild fluid overload, with bilateral crackles, interstitial edema on x-ray and CT Echo showed EF 60 to 65% -Troponin negative x 2 Has completed 5 days course of antibiotics   PEG tube site leakage Patient did undergo repositioning by IR on 10/10/2023 Tube is still leaking and so IR plans to handle this again on Monday   Hyperkalemia Monitor electrolyte closely   UTI Has completed antibiotics   IDDM with hyperglycemia - SSI   HTN - Continue metoprolol    Diabetic neuropathy - Continue gabapentin    Deconditioning - PT evaluation     DVT prophylaxis: Lovenox    Code Status: Full code   Family Communication: None at bedside     Consults called: None   Subjective:  Admits to improvement in respiratory function Patient denies nausea vomiting abdominal pain chest pain IR planning to do PEG tube placement tomorrow as they were very busy today   Physical Exam:   ENMT: Mucous membranes are moist. Posterior pharynx clear of any exudate or lesions.Normal dentition.  Neck: normal, supple, no masses, no thyromegaly Respiratory: Air entry decreased at the bases bilaterally Cardiovascular: Regular rate and rhythm, systolic murmur on heart base. No extremity edema. 2+ pedal pulses. No carotid bruits.  Abdomen: no tenderness, no masses palpated. No hepatosplenomegaly. Bowel sounds positive.  Musculoskeletal: no clubbing / cyanosis. No joint deformity upper and lower extremities. Good ROM, no contractures. Normal muscle tone.  Skin: no rashes, lesions, ulcers. No induration Neurologic: CN 2-12 grossly intact. Sensation intact, DTR normal. Strength 5/5 in all 4.  Psychiatric: Normal judgment and insight. Alert and oriented x 3. Normal mood.      Data Reviewed:   Chest x-ray  reviewed    Vitals:   10/14/23 1200 10/14/23 1300 10/14/23 1400 10/14/23 1514  BP: (!) 118/52 (!) 119/54 (!) 126/55   Pulse: 85 86 90 91  Resp: 17  16 18 15   Temp:    98.4 F (36.9 C)  TempSrc:    Oral  SpO2: 95% 95% 92% 93%  Weight:      Height:          Latest Ref Rng & Units 10/14/2023    3:49 AM 10/12/2023    7:21 AM 10/11/2023    2:53 AM  CBC  WBC 4.0 - 10.5 K/uL 10.8  10.4  10.4   Hemoglobin 12.0 - 15.0 g/dL 87.7  87.4  87.2   Hematocrit 36.0 - 46.0 % 36.8  38.3  39.7   Platelets 150 - 400 K/uL 227  219  213        Latest Ref Rng & Units 10/14/2023    3:49 AM 10/12/2023    7:21 AM 10/11/2023    2:53 AM  BMP  Glucose 70 - 99 mg/dL 830  802  855   BUN 8 - 23 mg/dL 28  26  26    Creatinine 0.44 - 1.00 mg/dL 9.09  9.01  9.04   Sodium 135 - 145 mmol/L 138  135  141   Potassium 3.5 - 5.1 mmol/L 4.1  4.3  4.3   Chloride 98 - 111 mmol/L 103  100  103   CO2 22 - 32 mmol/L 24  23  28    Calcium  8.9 - 10.3 mg/dL 9.3  9.3  9.5      Author: Drue ONEIDA Potter, MD 10/14/2023 5:07 PM  For on call review [email or web address].

## 2023-10-14 NOTE — Progress Notes (Signed)
 Nutrition Follow-up  DOCUMENTATION CODES:   Not applicable  INTERVENTION:   Once ok per IR, resume:  Mallie Pinion RTH @55ml /hr continuous   Free water  flushes 50ml q4 hours to maintain tube patency   Regimen provides 1848kcal/day, 82g/day protein and 1296ml/day of free water .   Mechanical soft diet   Daily weights   NUTRITION DIAGNOSIS:   Inadequate oral intake related to dysphagia as evidenced by NPO status (pt with chronic G-tube). -ongoing   GOAL:   Patient will meet greater than or equal to 90% of their needs -met   MONITOR:   PO intake, Labs, Weight trends, TF tolerance, Skin, I & O's  ASSESSMENT:   83 year old female with PMHx of breast cancer s/p lumpectomy, right partial mastectomy and chemo/XRT, DM, diabetic peripheral neuropathy, GERD, vocal cord paralysis, tracheal stenosis requiring chronic tracheostomy, aspiration PNA, chronic G-tube (placed surgically 01/29/2020), s/p hand-assisted laparoscopic right hemicolectomy 11/06/2018 and s/p robotic assisted incisional ventral hernia repair on 10/18/2020 who is now admitted with CHF exacerbation and AKI.  Met with pt in room today. Pt reports that she is feeling ok just tired. Pt's G-tube has continued to leak. Plan is for IR to exchange the G-tube today to a 54F. Tube feeds have been on hold since midnight. Will resume tube feeds once ok per IR. Pt remains at refeed risk. Last BM noted 8/15. Per chart, pt is up ~5lbs since admission. Pt +3.4L on her I & Os.   Medications reviewed and include: lovenox , insulin , melatonin, protonix   Labs reviewed: K 4.1 wnl, BUN 28(H) B12- 732 wnl- 8/13 Wbc- 10.8(H) Cbgs- 92, 134, 153, 174 x 24 hrs   Diet Order:   Diet Order             Diet NPO time specified Except for: Sips with Meds  Diet effective midnight                  EDUCATION NEEDS:   Education needs have been addressed  Skin:  Skin Assessment: Reviewed RN Assessment (MASD)  Last BM:  8/15- type 4  Height:    Ht Readings from Last 1 Encounters:  10/08/23 5' 5 (1.651 m)    Weight:   Wt Readings from Last 1 Encounters:  10/14/23 72.7 kg    Ideal Body Weight:  56.8 kg  BMI:  Body mass index is 26.67 kg/m.  Estimated Nutritional Needs:   Kcal:  1600-1800kcal/day  Protein:  80-90g/day  Fluid:  1.4-1.6L/day  Augustin Shams MS, RD, LDN If unable to be reached, please send secure chat to RD inpatient available from 8:00a-4:00p daily

## 2023-10-15 ENCOUNTER — Inpatient Hospital Stay: Admitting: Radiology

## 2023-10-15 DIAGNOSIS — R0902 Hypoxemia: Secondary | ICD-10-CM | POA: Diagnosis not present

## 2023-10-15 HISTORY — PX: IR REPLACE G-TUBE SIMPLE WO FLUORO: IMG2323

## 2023-10-15 LAB — CBC WITH DIFFERENTIAL/PLATELET
Abs Immature Granulocytes: 0.2 K/uL — ABNORMAL HIGH (ref 0.00–0.07)
Basophils Absolute: 0.1 K/uL (ref 0.0–0.1)
Basophils Relative: 1 %
Eosinophils Absolute: 0.2 K/uL (ref 0.0–0.5)
Eosinophils Relative: 2 %
HCT: 39 % (ref 36.0–46.0)
Hemoglobin: 12.8 g/dL (ref 12.0–15.0)
Immature Granulocytes: 2 %
Lymphocytes Relative: 19 %
Lymphs Abs: 1.9 K/uL (ref 0.7–4.0)
MCH: 27.8 pg (ref 26.0–34.0)
MCHC: 32.8 g/dL (ref 30.0–36.0)
MCV: 84.6 fL (ref 80.0–100.0)
Monocytes Absolute: 0.7 K/uL (ref 0.1–1.0)
Monocytes Relative: 7 %
Neutro Abs: 7 K/uL (ref 1.7–7.7)
Neutrophils Relative %: 69 %
Platelets: 239 K/uL (ref 150–400)
RBC: 4.61 MIL/uL (ref 3.87–5.11)
RDW: 12.8 % (ref 11.5–15.5)
WBC: 10 K/uL (ref 4.0–10.5)
nRBC: 0 % (ref 0.0–0.2)

## 2023-10-15 LAB — BASIC METABOLIC PANEL WITH GFR
Anion gap: 14 (ref 5–15)
BUN: 32 mg/dL — ABNORMAL HIGH (ref 8–23)
CO2: 21 mmol/L — ABNORMAL LOW (ref 22–32)
Calcium: 9.5 mg/dL (ref 8.9–10.3)
Chloride: 103 mmol/L (ref 98–111)
Creatinine, Ser: 0.99 mg/dL (ref 0.44–1.00)
GFR, Estimated: 58 mL/min — ABNORMAL LOW (ref >60)
Glucose, Bld: 169 mg/dL — ABNORMAL HIGH (ref 70–99)
Potassium: 4.2 mmol/L (ref 3.5–5.1)
Sodium: 138 mmol/L (ref 135–145)

## 2023-10-15 LAB — GLUCOSE, CAPILLARY
Glucose-Capillary: 117 mg/dL — ABNORMAL HIGH (ref 70–99)
Glucose-Capillary: 122 mg/dL — ABNORMAL HIGH (ref 70–99)
Glucose-Capillary: 169 mg/dL — ABNORMAL HIGH (ref 70–99)
Glucose-Capillary: 170 mg/dL — ABNORMAL HIGH (ref 70–99)
Glucose-Capillary: 171 mg/dL — ABNORMAL HIGH (ref 70–99)

## 2023-10-15 LAB — MAGNESIUM: Magnesium: 2.1 mg/dL (ref 1.7–2.4)

## 2023-10-15 LAB — PHOSPHORUS: Phosphorus: 3.2 mg/dL (ref 2.5–4.6)

## 2023-10-15 MED ORDER — ROPINIROLE HCL 0.25 MG PO TABS
0.5000 mg | ORAL_TABLET | Freq: Every day | ORAL | Status: DC
  Filled 2023-10-15: qty 2

## 2023-10-15 MED ORDER — ESCITALOPRAM OXALATE 10 MG PO TABS
20.0000 mg | ORAL_TABLET | Freq: Every day | ORAL | Status: DC
  Administered 2023-10-15: 20 mg
  Filled 2023-10-15: qty 2

## 2023-10-15 MED ORDER — LIDOCAINE HCL 1 % IJ SOLN
5.0000 mL | Freq: Once | INTRAMUSCULAR | Status: AC
  Administered 2023-10-15: 5 mL via INTRADERMAL

## 2023-10-15 MED ORDER — POLYETHYLENE GLYCOL 3350 17 G PO PACK: 17.0000 g | PACK | Freq: Every day | ORAL | 0 refills | Status: AC | PRN

## 2023-10-15 MED ORDER — LIDOCAINE HCL 1 % IJ SOLN
INTRAMUSCULAR | Status: AC
  Filled 2023-10-15: qty 20

## 2023-10-15 MED ORDER — KATE FARMS STANDARD 1.4 PO LIQD
325.0000 mL | Freq: Four times a day (QID) | ORAL | Status: DC
  Administered 2023-10-15: 60 mL
  Filled 2023-10-15: qty 325

## 2023-10-15 MED ORDER — EZETIMIBE 10 MG PO TABS
10.0000 mg | ORAL_TABLET | Freq: Every day | ORAL | Status: DC
  Administered 2023-10-15: 10 mg
  Filled 2023-10-15: qty 1

## 2023-10-15 MED ORDER — FREE WATER
100.0000 mL | Freq: Four times a day (QID) | Status: DC
  Administered 2023-10-15: 100 mL

## 2023-10-15 NOTE — Discharge Summary (Signed)
 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   Patient was feeling fine yesterday [name], she woke up at night complaining about  cannot breathe, denied any fever or chills no cough no chest pains.  At baseline she breathes through tracheostomy on room air and connected to ventilator at night.  She uses walker and wheelchair to ambulate but never experienced any exertional dyspnea or orthopnea before.  Denied any nauseous vomiting, she uses most feeding only for pleasure, and she has been stable on PEG tube feeding.   ED Course: Afebrile, tachycardia tachypneic O2 saturation 75%, on room air and patient was put on nonrebreather mask O2 saturation improved to 100%.  Overnight ED physician and staff did multiple suctioning with copious amount of clear phlegm and O2 saturation maintained well.  Chest x-ray showed mild interstitial edema, CTA showed no PE, signs of  small airway disease and diffuse bronchial wall thickening with increased soft tissue within peribronchial vascular interstitium, chronic fibrosis.  Blood work showed WBC 18.3 hemoglobin 13.8 glucose 281 BUN 26 creatinine 1.1  Patient was treated for UTI as well as aspiration pneumonia with improvement.  Was also noted to have G-tube site leakage that was intervened by IR.  Patient doing much better now and have therefore been cleared for discharge today and will follow-up as an outpatient   Consultants: Interventional radiology, dietitian Procedures performed: As mentioned above Disposition: Home health Diet recommendation:  Tube feeding DISCHARGE MEDICATION: Allergies as of 10/15/2023       Reactions   Shellfish Allergy Shortness Of Breath   respitatory distress    Sodium Sulfite Shortness Of Breath   Severe Congestion that creates inability to breath   Sulfa Antibiotics Shortness Of Breath, Other (See Comments)   respitatory distress   Codeine Other (See Comments)   Per patient it kept her up all night   Glucerna [alimentum] Itching   Wound Dressing Adhesive Rash        Medication List     STOP taking these medications    hydrOXYzine  25 MG tablet Commonly known as: ATARAX        TAKE these medications    acetaminophen  500 MG tablet Commonly known as: TYLENOL  Take 500-1,000 mg by mouth every 6 (six) hours as needed. What changed: Another medication with the same name was removed. Continue taking this medication, and follow the directions you see here.   Baza Protect Moisture Barrier 12 % Crea Generic drug: Zinc  Oxide Apply 1 application twice a day by topical route.   buPROPion  150 MG 12 hr tablet Commonly known as: WELLBUTRIN  SR Take 150  mg by mouth 2 (two) times daily.   Cholecalciferol  50 MCG (2000 UT) Caps Take 2,000 Units by mouth daily.   clotrimazole 1 % cream Commonly known as: LOTRIMIN APPLY A THIN LAYER TO THE AFFECTED AREA 2 TIMES DAILY    diphenhydrAMINE  25 mg capsule Commonly known as: BENADRYL  Take 1 capsule (25 mg total) by mouth every 6 (six) hours as needed for allergies or itching.   EPINEPHrine  0.3 mg/0.3 mL Soaj injection Commonly known as: EPI-PEN Inject 0.3 mLs (0.3 mg dose) into the muscle once as needed for Anaphylaxis for up to 1 dose.   escitalopram  20 MG tablet Commonly known as: LEXAPRO  TAKE 1 TABLET VIA TUBE DAILY. TAKE ALONG WITH 10 MG DOSE.   estradiol  0.1 MG/GM vaginal cream Commonly known as: ESTRACE  Insert 1 applicatorful twice a week by vaginal route for 30 days.   ezetimibe  10 MG tablet Commonly known as: ZETIA  Take 10 mg by mouth daily.   feeding supplement (KATE FARMS STANDARD 1.4) Liqd liquid Place 1,440 mLs into feeding tube daily.   folic acid  1 MG tablet Commonly known as: FOLVITE  Take 1 tablet by mouth daily.   free water  Soln Place 50 mLs into feeding tube every 4 (four) hours.   FreeStyle Libre 2 Sensor Misc 1 Device by Does not apply route daily.   Gabapentin  (Once-Daily) 300 MG Tabs 300 mg by Enteral route.  Take 300 mg by G tube 3 (three) times a day.   gabapentin  300 MG capsule Commonly known as: NEURONTIN  Place 300 mg into feeding tube every 8 (eight) hours.   Gemtesa  75 MG Tabs Generic drug: Vibegron  Take 1 tablet (75 mg total) by mouth daily.   HumaLOG KwikPen 100 UNIT/ML KwikPen Generic drug: insulin  lispro Inject 0-5 Units into the skin 3 x daily with food.   ipratropium-albuterol  0.5-2.5 (3) MG/3ML Soln Commonly known as: DUONEB Take 3 mLs by nebulization every 6 (six) hours as needed.   loratadine  10 MG tablet Commonly known as: CLARITIN  Place 1 tablet (10 mg total) into feeding tube daily.   lovastatin  20 MG tablet Commonly known as: MEVACOR  Take 20 mg by mouth at bedtime.   meclizine  12.5 MG tablet Commonly known as: ANTIVERT  Take 12.5 mg by mouth 3 (three) times daily as needed for dizziness.   Melatonin 5 MG Caps Take by mouth.    metoprolol  tartrate 25 MG tablet Commonly known as: LOPRESSOR  Place 1 tablet (25 mg total) into feeding tube daily. What changed:  how much to take when to take this   multivitamin Liqd Place 15 mLs into feeding tube daily.   pantoprazole  40 MG tablet Commonly known as: PROTONIX  Take 40 mg by mouth daily.   Pen Needles 3/16 31G X 5 MM Misc 1 Container by Does not apply route daily.   polyethylene glycol 17 g packet Commonly known as: MIRALAX  / GLYCOLAX  Place 17 g into feeding tube daily as needed for moderate constipation.   Prolia  60 MG/ML Sosy injection Generic drug: denosumab  Inject 1 mL by subcutaneous route.   rOPINIRole  0.5 MG tablet Commonly known as: REQUIP  Take 0.5 mg by mouth at bedtime.   sodium chloride  HYPERTONIC 3 % nebulizer solution Take 4 mLs by nebulization 2 (two) times daily.   Systane Balance 0.6 % Soln Generic drug: Propylene Glycol Place 1 drop into both eyes 2 (two) times daily.   Toujeo  SoloStar 300 UNIT/ML Solostar Pen Generic drug: insulin  glargine (1 Unit Dial) Inject 25 Units into the skin daily.  Discharge Exam: Filed Weights   10/13/23 0500 10/14/23 0350 10/15/23 0447  Weight: 73.7 kg 72.7 kg 74.5 kg    Respiratory: Air entry decreased at the bases bilaterally Cardiovascular: Regular rate and rhythm, systolic murmur on heart base. No extremity edema. 2+ pedal pulses. No carotid bruits.  Abdomen: no tenderness, no masses palpated. No hepatosplenomegaly. Bowel sounds positive.  Musculoskeletal: no clubbing / cyanosis. No joint deformity upper and lower extremities. Good ROM, no contractures. Normal muscle tone.  Skin: no rashes, lesions, ulcers. No induration Neurologic: CN 2-12 grossly intact. Sensation intact, DTR normal. Strength 5/5 in all 4.  Psychiatric: Normal judgment and insight. Alert and oriented x 3. Normal mood.    Condition at discharge: STABLE  The results of significant diagnostics from this  hospitalization (including imaging, microbiology, ancillary and laboratory) are listed below for reference.   Imaging Studies: IR REPLACE G-TUBE SIMPLE WO FLUORO Result Date: 10/15/2023 INDICATION: Persistent leakage of the gastrostomy tube at the ostomy site. Patient has repeatedly refused placement of a jejunostomy tube. Physical exam of the epigastric region demonstrates erosion of the ostomy secondary to the external portion of the gastrostomy tube laying towards the left side. EXAM: Upsize of gastrostomy tube MEDICATIONS: None ANESTHESIA/SEDATION: None CONTRAST:  None-administered into the gastric lumen. FLUOROSCOPY: None COMPLICATIONS: None immediate. PROCEDURE: Informed written consent was obtained from the patient after a thorough discussion of the procedural risks, benefits and alternatives. All questions were addressed. Maximal Sterile Barrier Technique was utilized including caps, mask, sterile gowns, sterile gloves, sterile drape, hand hygiene and skin antiseptic. A timeout was performed prior to the initiation of the procedure. Local anesthesia was achieved by infiltrating the subcutaneous tissue with 1% lidocaine . Lidocaine  was administered after the patient's epigastric region was prepped and draped in the usual sterile fashion. An initial attempt at a pursestring suture was applied, however the patient's skin has thickened secondary to inflammation and scarring. Given the linear erosion, 2 Ethilon sutures were placed at the skin to close the primary erosion and therefore approximating the skin to allow for less leakage. The gastrostomy tube was also exchanged over a short Amplatz wire and then the new gastrostomy tube 26 [place] was advanced over the guidewire and then the retention bulb was inflated with approximately 10 mL of normal saline. After exchanging the catheter and applying the 2 closure sutures, the access site was evaluated and the G-tube is more upright in location, however a  retention suture was then applied on the patient's right side of the ostomy and applied to the [facility] disc in order to prevent the catheter from continuing to lay in a left-sided position allowing for the skin to heal. The patient was informed of her choices of feeding catheters and the likelihood of feeding occurring through a G-tube versus a jejunostomy tube is causing a large portion of the difficulties she is having with wound healing. The patient is adamant to not have a jejunostomy tube. Ultimately the closure may need to be performed with more direct surgical closure if this does not heal. IMPRESSION: Upsizing of a gastrostomy tube to a 26 [place]. Suture closure of the erosion at the ostomy. The patient will be followed daily for wound healing. Electronically Signed   By: Cordella Banner   On: 10/15/2023 10:52   IR REPLACE G-TUBE SIMPLE WO FLUORO Result Date: 10/10/2023 INDICATION: G-tube possible malfunction and malposition . Patient declined G to GJ conversion EXAM: REPLACEMENT OF GASTROSTOMY TUBE MEDICATIONS: None COMPLICATIONS: None immediate. PROCEDURE: Informed written  consent was obtained from the patient and/or patient's representative after a discussion of the risks, benefits and alternatives to treatment. Questions regarding the procedure were encouraged and answered. A timeout was performed prior to the initiation of the procedure. The upper abdomen and external portion of the existing gastrostomy tube was prepped and draped in the usual sterile fashion, and a sterile drape was applied covering the operative field. Maximum barrier sterile technique with sterile gowns and gloves were used for the procedure. A timeout was performed prior to the initiation of the procedure. The existing gastrostomy tube retention balloon was decompressed. Next, the tube was removed and exchanged for a new 24 Fr balloon inflatable gastrostomy tube. The balloon was inflated with saline and dilute contrast and pulled  against the anterior inner lumen of the stomach and the external disc was cinched. A dressing was applied. The patient tolerated the procedure well without immediate postprocedural complication. IMPRESSION: 1. Successful replacement of gastrostomy tube with a new 24 Fr balloon-retention gastrostomy tube. The feeding tube is ready for use. 2. The patient declined G-to-GJ conversion RECOMMENDATIONS: The patient will return to Vascular Interventional Radiology (VIR) for routine feeding tube evaluation and exchange in 6 months. Thom Hall, MD [facility] Electronically Signed   By: Thom Hall M.D.   On: 10/10/2023 12:12   DG Abd 1 View Result Date: 10/10/2023 CLINICAL DATA:  [id number] Gastrostomy tube in place [facility]) [id number] EXAM: ABDOMEN - 1 VIEW COMPARISON:  KUB, 10/13/2021.  CT AP, 02/14/2022. FINDINGS: SUPPORT LINES; gastrostomy tube at the LEFT upper quadrant, well-positioned removed at the gastric antrum, with intraluminal contrast opacification of the stomach. The bowel gas pattern is normal. Cholecystectomy clips. Surgical staple line in the RIGHT lower quadrant. No acute osseous abnormality. IMPRESSION: Well-positioned gastrostomy tube, at the gastric antrum. Electronically Signed   By: Thom Hall M.D.   On: 10/10/2023 11:51   ECHOCARDIOGRAM COMPLETE Result Date: 10/09/2023    ECHOCARDIOGRAM REPORT   Patient Name:   Cayden SMALL Chestnutt Date of Exam: 10/09/2023 Medical Rec #:  [id number]           Height:       65.0 in Accession #:    [id number]          Weight:       165.3 lb Date of Birth:  1940-07-13          BSA:          1.824 m Patient Age:    82 years            BP:           102/41 mmHg Patient Gender: F                   HR:           87 bpm. Exam Location:  Inpatient Procedure: 2D Echo (Both Spectral and Color Flow Doppler were utilized during            procedure). Indications:     acute diastolic chf  History:         Patient has prior  history of Echocardiogram examinations, most                  recent 10/11/2022. Pulmonary HTN and history of breast cancer;                  Risk Factors:Diabetes.  Sonographer:     Tinnie Barefoot RDCS  Referring Phys:  [id number] CORT ONEIDA MANA Diagnosing Phys: Evalene Lunger MD  Sonographer Comments: Technically difficult study due to poor echo windows. Image acquisition challenging due to respiratory motion. IMPRESSIONS  1. Left ventricular ejection fraction, by estimation, is 60 to 65%. The left ventricle has normal function. The left ventricle has no regional wall motion abnormalities. Left ventricular diastolic parameters are indeterminate.  2. Right ventricular systolic function is normal. The right ventricular size is normal.  3. The mitral valve is normal in structure. Mild mitral valve regurgitation. No evidence of mitral stenosis. Moderate mitral annular calcification.  4. The aortic valve is calcified. Aortic valve regurgitation is mild. Moderate aortic valve stenosis. Aortic valve area, by VTI measures 0.35 cm. Aortic valve mean gradient measures 24.0 mmHg. Aortic valve Vmax measures 3.08 m/s.  5. The inferior vena cava is normal in size with greater than 50% respiratory variability, suggesting right atrial pressure of 3 mmHg. FINDINGS  Left Ventricle: Left ventricular ejection fraction, by estimation, is 60 to 65%. The left ventricle has normal function. The left ventricle has no regional wall motion abnormalities. Definity  contrast agent was given IV to delineate the left ventricular  endocardial borders. Strain was performed and the global longitudinal strain is indeterminate. The left ventricular internal cavity size was normal in size. There is no left ventricular hypertrophy. Left ventricular diastolic parameters are indeterminate. Right Ventricle: The right ventricular size is normal. No increase in right ventricular wall thickness. Right ventricular systolic function is normal. Left Atrium: Left  atrial size was normal in size. Right Atrium: Right atrial size was normal in size. Pericardium: There is no evidence of pericardial effusion. Mitral Valve: The mitral valve is normal in structure. There is mild calcification of the mitral valve leaflet(s). Moderate mitral annular calcification. Mild mitral valve regurgitation. No evidence of mitral valve stenosis. MV peak gradient, 11.6 mmHg. The mean mitral valve gradient is 5.0 mmHg. Tricuspid Valve: The tricuspid valve is normal in structure. Tricuspid valve regurgitation is not demonstrated. No evidence of tricuspid stenosis. Aortic Valve: The aortic valve is calcified. Aortic valve regurgitation is mild. Moderate aortic stenosis is present. Aortic valve mean gradient measures 24.0 mmHg. Aortic valve peak gradient measures 37.9 mmHg. Aortic valve area, by VTI measures 0.35 cm. Pulmonic Valve: The pulmonic valve was normal in structure. Pulmonic valve regurgitation is not visualized. No evidence of pulmonic stenosis. Aorta: The aortic root is normal in size and structure. Venous: The inferior vena cava is normal in size with greater than 50% respiratory variability, suggesting right atrial pressure of 3 mmHg. IAS/Shunts: No atrial level shunt detected by color flow Doppler. Additional Comments: 3D was performed not requiring image post processing on an independent workstation and was indeterminate.  LEFT VENTRICLE PLAX 2D LVIDd:         3.60 cm   Diastology LVIDs:         2.30 cm   LV e' medial:  8.05 cm/s LV PW:         0.90 cm   LV e' lateral: 9.79 cm/s LV IVS:        0.70 cm LVOT diam:     1.30 cm LV SV:         24 LV SV Index:   13 LVOT Area:     1.33 cm  RIGHT VENTRICLE RV Basal diam:  2.50 cm TAPSE (M-mode): 1.6 cm LEFT ATRIUM             Index  RIGHT ATRIUM          Index LA diam:        3.80 cm 2.08 cm/m   RA Area:     9.83 cm LA Vol (A2C):   50.6 ml 27.73 ml/m  RA Volume:   20.90 ml 11.46 ml/m LA Vol (A4C):   37.8 ml 20.72 ml/m LA Biplane  Vol: 45.7 ml 25.05 ml/m  AORTIC VALVE AV Area (Vmax):    0.39 cm AV Area (Vmean):   0.35 cm AV Area (VTI):     0.35 cm AV Vmax:           308.00 cm/s AV Vmean:          232.000 cm/s AV VTI:            0.675 m AV Peak Grad:      37.9 mmHg AV Mean Grad:      24.0 mmHg LVOT Vmax:         91.45 cm/s LVOT Vmean:        61.550 cm/s LVOT VTI:          0.180 m LVOT/AV VTI ratio: 0.27  AORTA Ao Asc diam: 2.70 cm MITRAL VALVE MV Area VTI:  0.59 cm    SHUNTS MV Peak grad: 11.6 mmHg   Systemic VTI:  0.18 m MV Mean grad: 5.0 mmHg    Systemic Diam: 1.30 cm MV Vmax:      1.70 m/s MV Vmean:     108.0 cm/s Evalene Lunger MD Electronically signed by Evalene Lunger MD Signature Date/Time: 10/09/2023/5:43:46 PM    Final    DG Chest 1 View Result Date: 10/09/2023 CLINICAL DATA:  [id number] CHF (congestive heart failure) (HCC) [id number] EXAM: CHEST  1 VIEW COMPARISON:  10/08/2023. FINDINGS: No significant change compared to the prior exam. Tracheostomy cannula in place. Cardiomediastinal contours are unchanged. Persistent elevation of the right hemidiaphragm. Similar small bilateral pleural effusions. Diffuse interstitial opacities are not significantly changed and may reflect mild interstitial pulmonary edema. No pneumothorax. Visualized osseous structures are unchanged. IMPRESSION: No significant change compared to the prior exam. Similar small bilateral pleural effusions and mild interstitial pulmonary edema. Electronically Signed   By: Harrietta Sherry M.D.   On: 10/09/2023 10:39   CT Angio Chest PE W and/or Wo Contrast Result Date: 10/08/2023 EXAM: CTA of the Chest with contrast for PE 10/08/2023 03:25:32 AM TECHNIQUE: CTA of the chest was performed after the administration of intravenous contrast. Multiplanar reformatted images are provided for review. MIP images are provided for review. Automated exposure control, iterative reconstruction, and/or weight based adjustment of the mA/kV was utilized to reduce the radiation dose to as  low as reasonably achievable. COMPARISON: 07/13/2022 CLINICAL HISTORY: Hypoxic trach patient. Eval PE, PNA, mucous plugging. FINDINGS: PULMONARY ARTERIES: There is satisfactory opacification of the pulmonary arteries. No signs of acute pulmonary embolism. MEDIASTINUM: Heart size is normal. No pericardial effusion. Aortic atherosclerosis and coronary artery calcifications. There is a tracheostomy tube in place with the tip above the carina. The esophagus appears diffusely dilated, unchanged from the previous exam. No enlarged mediastinal or hilar lymph nodes. LYMPH NODES: No enlarged mediastinal or hilar lymph nodes. LUNGS AND PLEURA: There is decreased AP diameter of the trachea and central airways compatible with chronic tracheobronchomalacia. Diffuse bronchial wall thickening with increased soft tissue within the central peribronchovascular interstitium. Mosaic attenuation pattern is identified within both lungs. No pneumothorax. Small pleural effusions with bilateral posterior pleural thickening. Patchy ground glass attenuation and mild  interstitial thickening noted bilaterally. Chronic fibrosis, architectural distortion, and volume loss are noted within the anterior medial right upper lobe. Bilateral pulmonary nodules are again noted. Index nodule within the right upper lobe measures 0.6 cm, image 30/6. Unchanged from previous exam. Right lower lobe lung nodule measures 6 mm, image 53/6. Unchanged from previous exam. Additional small scattered pulmonary nodules are similar to the previous exam, measuring less than 1 cm. UPPER ABDOMEN: No acute abnormality within the imaged portions of the upper abdomen. SOFT TISSUES AND BONES: Multiple remote left posterior rib fracture deformities. Numerous bilateral chronic healed rib fracture deformities are again noted, similar to the previous exam. No acute osseous abnormality. LIMITATIONS/ARTIFACTS: Study is mildly limited due to respiratory motion artifact. IMPRESSION: 1.  No evidence of pulmonary embolism. 2. Bilateral pulmonary nodules, unchanged from previous exam. 3. Small pleural effusions with bilateral posterior pleural thickening. Mild interstitial thickening and ground-glass attenuation may reflect early pulmonary edema. Correlate for any clinical signs or symptoms of mild chf. 4. Decreased AP diameter of the trachea and central airways compatible with chronic tracheobronchomalacia. 5. Mosaic attenuation pattern noted within both lungs compatible with small airways disease. Additionally, there is diffuse bronchial wall thickening with increased soft tissue within the peribronchovascular interstitium. 6. Chronic fibrosis, architectural distortion, and volume loss in the anterior medial right upper lobe. Unchanged from previous exam. 7. Aortic atherosclerosis and coronary artery calcifications. Electronically signed by: Waddell Calk MD 10/08/2023 06:02 AM EDT RP Workstation: [id number]   DG Chest Portable 1 View Result Date: 10/08/2023 CLINICAL DATA:  Hypoxia EXAM: PORTABLE CHEST 1 VIEW COMPARISON:  07/13/2022 FINDINGS: Tracheostomy in expected position. Stable elevation of right hemidiaphragm. Mild diffuse interstitial pulmonary infiltrate favored represent mild interstitial pulmonary edema. No confluent pulmonary infiltrate. No pneumothorax or pleural effusion. Cardiac size within normal limits. Surgical clips seen within the right axilla. IMPRESSION: 1. Mild interstitial pulmonary edema. Electronically Signed   By: Dorethia Molt M.D.   On: 10/08/2023 02:49    Microbiology: Results for orders placed or performed during the hospital encounter of 10/08/23  Blood Culture (routine x 2)     Status: None   Collection Time: 10/08/23  2:16 AM   Specimen: BLOOD  Result Value Ref Range Status   Specimen Description BLOOD BLOOD LEFT ARM BLOOD LEFT HAND  Final   Special Requests   Final    BOTTLES DRAWN AEROBIC AND ANAEROBIC Blood Culture results may not be optimal due to  an inadequate volume of blood received in culture bottles   Culture   Final    NO GROWTH 5 DAYS Performed at [facility], [address] [place], [place] [address]    Report Status 10/13/2023 FINAL  Final  Blood Culture (routine x 2)     Status: None   Collection Time: 10/08/23  2:16 AM   Specimen: BLOOD  Result Value Ref Range Status   Specimen Description BLOOD BLOOD LEFT HAND  Final   Special Requests   Final    BOTTLES DRAWN AEROBIC AND ANAEROBIC Blood Culture results may not be optimal due to an inadequate volume of blood received in culture bottles   Culture   Final    NO GROWTH 5 DAYS Performed at [facility], [address], [place], [place] [address]    Report Status 10/13/2023 FINAL  Final  Respiratory (~20 pathogens) panel by PCR     Status: None   Collection Time: 10/08/23  8:30 AM   Specimen: Nasopharyngeal Swab; Respiratory  Result Value  Ref Range Status   Adenovirus NOT DETECTED NOT DETECTED Final   Coronavirus 229E NOT DETECTED NOT DETECTED Final    Comment: (NOTE) The Coronavirus on the Respiratory Panel, DOES NOT test for the novel  Coronavirus (2019 nCoV)    Coronavirus HKU1 NOT DETECTED NOT DETECTED Final   Coronavirus NL63 NOT DETECTED NOT DETECTED Final   Coronavirus OC43 NOT DETECTED NOT DETECTED Final   Metapneumovirus NOT DETECTED NOT DETECTED Final   Rhinovirus / Enterovirus NOT DETECTED NOT DETECTED Final   Influenza A NOT DETECTED NOT DETECTED Final   Influenza B NOT DETECTED NOT DETECTED Final   Parainfluenza Virus 1 NOT DETECTED NOT DETECTED Final   Parainfluenza Virus 2 NOT DETECTED NOT DETECTED Final   Parainfluenza Virus 3 NOT DETECTED NOT DETECTED Final   Parainfluenza Virus 4 NOT DETECTED NOT DETECTED Final   Respiratory Syncytial Virus NOT DETECTED NOT DETECTED Final   Bordetella pertussis NOT DETECTED NOT DETECTED Final   Bordetella Parapertussis NOT DETECTED NOT DETECTED Final   Chlamydophila pneumoniae NOT  DETECTED NOT DETECTED Final   Mycoplasma pneumoniae NOT DETECTED NOT DETECTED Final    Comment: Performed at [facility], [address] [place], [place] [address]  Urine Culture     Status: Abnormal   Collection Time: 10/08/23  8:31 AM   Specimen: Urine, Clean Catch  Result Value Ref Range Status   Specimen Description   Final    URINE, CLEAN CATCH Performed at [facility], [address] [place], [place] [address]    Special Requests   Final    NONE Performed at [facility], [address] [place], [place] [address]    Culture (A)  Final    <10,000 COLONIES/mL INSIGNIFICANT GROWTH Performed at [facility], [address] [place], [place] [address]    Report Status 10/09/2023 FINAL  Final  MRSA Next Gen by PCR, Nasal     Status: None   Collection Time: 10/08/23 11:11 AM   Specimen: Nasal Mucosa; Nasal Swab  Result Value Ref Range Status   MRSA by PCR Next Gen NOT DETECTED NOT DETECTED Final    Comment: (NOTE) The GeneXpert MRSA Assay (FDA approved for NASAL specimens only), is one component of a comprehensive MRSA colonization surveillance program. It is not intended to diagnose MRSA infection nor to guide or monitor treatment for MRSA infections. Test performance is not FDA approved in patients less than 73 years old. Performed at [facility], [address] [place], [place] [address]     Labs: CBC: Recent Labs  Lab 10/10/23 0251 10/11/23 0253 10/12/23 0721 10/14/23 0349 10/15/23 0536  WBC 10.0 10.4 10.4 10.8* 10.0  NEUTROABS 7.0 7.1 7.5 7.6 7.0  HGB 12.2 12.7 12.5 12.2 12.8  HCT 38.0 39.7 38.3 36.8 39.0  MCV 88.0 88.4 86.5 86.2 84.6  PLT 198 213 219 227 239   Basic Metabolic Panel: Recent Labs  Lab 10/09/23 0338 10/10/23 0251 10/11/23 0253 10/12/23 0721 10/14/23 0349 10/15/23 0536  NA 139 140 141 135 138 138  K 4.2 4.4 4.3 4.3 4.1 4.2  CL 103 102 103 100 103 103  CO2 25 24 28 23 24  21*  GLUCOSE  264* 260* 144* 197* 169* 169*  BUN 30* 26* 26* 26* 28* 32*  CREATININE 1.12* 0.96 0.95 0.98 0.90 0.99  CALCIUM  9.4 9.4 9.5 9.3 9.3 9.5  MG 2.1  --   --   --   --  2.1  PHOS 3.7  --   --   --   --  3.2   Liver Function Tests: No results for input(s): AST, ALT, ALKPHOS, BILITOT, PROT, ALBUMIN  in the last 168 hours. CBG: Recent Labs  Lab 10/14/23 2046 10/15/23 0008 10/15/23 0446 10/15/23 0803 10/15/23 1110  GLUCAP 208* 170* 169* 171* 117*    Discharge time spent:  .  Signed: Drue ONEIDA Potter, MD [facility] 10/15/2023

## 2023-10-15 NOTE — Procedures (Signed)
 Interventional Radiology Procedure Note  Procedure: G tube upsize and suturing of ostomy  Complications: None  Estimated Blood Loss: < 10 mL  Findings: 26Fr G tube placed after indwelling tube removed over an Amplatz wire.  Suturing performed to prevent further erosion of ostomy.  Cordella DELENA Banner, MD

## 2023-10-15 NOTE — Progress Notes (Addendum)
 Nutrition Brief Follow Up Note  Tube feed change  INTERVENTION:   Resume home tube feed regimen of:  [employer] 1.4- Give 1 carton four times daily via tube. Flush with 50ml of water  before and after each feed.   Regimen provides 1820kcal/day, 80g/day protein and 1473ml/day of free water .   Mechanical soft diet   ASSESSMENT:   83 year old female with PMHx of breast cancer s/p lumpectomy, right partial mastectomy and chemo/XRT, DM, diabetic peripheral neuropathy, GERD, vocal cord paralysis, tracheal stenosis requiring chronic tracheostomy, aspiration PNA, chronic G-tube (placed surgically 01/29/2020), s/p hand-assisted laparoscopic right hemicolectomy 11/06/2018 and s/p robotic assisted incisional ventral hernia repair on 10/18/2020 who is now admitted with CHF exacerbation and AKI.  -Pt s/p IR G-tube exchange today- Pt now with 19F non-Enfit tube  RD contacted [facility] to notify them that pt now with non-Enfit tube. Home Health company will ship new supplies out to patient's house.   Estimated Nutritional Needs:   Kcal:  1600-1800kcal/day  Protein:  80-90g/day  Fluid:  1.4-1.6L/day  Augustin Shams MS, RD, LDN If unable to be reached, please send secure chat to RD inpatient available from 8:00a-4:00p daily

## 2023-10-15 NOTE — Plan of Care (Signed)
  Problem: Coping: Goal: Ability to adjust to condition or change in health will improve Outcome: Progressing   Problem: Fluid Volume: Goal: Ability to maintain a balanced intake and output will improve Outcome: Progressing   Problem: Health Behavior/Discharge Planning: Goal: Ability to identify and utilize available resources and services will improve Outcome: Progressing Goal: Ability to manage health-related needs will improve Outcome: Progressing   Problem: Metabolic: Goal: Ability to maintain appropriate glucose levels will improve Outcome: Progressing   Problem: Nutritional: Goal: Maintenance of adequate nutrition will improve Outcome: Progressing   Problem: Clinical Measurements: Goal: Respiratory complications will improve Outcome: Progressing

## 2023-10-15 NOTE — Progress Notes (Signed)
 Patient given supplies for home. New dressing placed on g-tube r/t drainage after flush and small feed. EMS called for transport, Husband [name] aware. EMS to arrive for pick up in 1.5 - 2 hours. Patient aware. No needs at this time. ABC intact. D/C instructions given patient

## 2023-10-15 NOTE — Progress Notes (Signed)
 Patient to IR with transporter Warren.

## 2023-10-15 NOTE — Progress Notes (Signed)
 Returned from [employer]

## 2023-11-12 ENCOUNTER — Encounter: Payer: Self-pay | Admitting: Internal Medicine

## 2023-11-12 ENCOUNTER — Ambulatory Visit: Admitting: Internal Medicine

## 2023-11-12 VITALS — BP 100/60 | HR 75 | Temp 99.1°F | Ht 64.0 in | Wt 165.0 lb

## 2023-11-12 DIAGNOSIS — J9612 Chronic respiratory failure with hypercapnia: Secondary | ICD-10-CM | POA: Diagnosis not present

## 2023-11-12 DIAGNOSIS — Z930 Tracheostomy status: Secondary | ICD-10-CM | POA: Diagnosis not present

## 2023-11-12 DIAGNOSIS — J9611 Chronic respiratory failure with hypoxia: Secondary | ICD-10-CM

## 2023-11-12 DIAGNOSIS — R0689 Other abnormalities of breathing: Secondary | ICD-10-CM

## 2023-11-12 DIAGNOSIS — J690 Pneumonitis due to inhalation of food and vomit: Secondary | ICD-10-CM

## 2023-11-12 NOTE — Progress Notes (Signed)
 Name: Angela David MRN: [id number] DOB: 1941-02-21     CONSULTATION DATE: 11/12/2023 REFERRING MD : Hospital follow   SYNOPSIS Acute on chronic hypercarbic and hypoxic respiratory failure Chronic dependency on tracheostomy VOCAL CORD DYSFUNCTION +MRSA pneumonia Esophageal dilatation/dysmotility by CT study Chronic dependency on tracheostomy History of tracheal stenosis History of vocal cord dysfunction   HOSPITAL ADMISSION 2023 01/22: Pt with chronic tracheostomy presented to ER with generalized weakness found to be hypercapnic requiring mechanical ventilation.  Evidence of pneumonia, MRSA. 01/22: CT Head revealed no CT evidence for acute intracranial abnormality. Atrophy and presumed chronic small vessel ischemic change of the white matter.  Sinusitis noted. 01/22: CTA Chest revealed no pulmonary embolus. Dilated fluid-filled esophagus from the thoracic inlet to the gastroesophageal junction, may be reflux or dysmotility. Achalasia also considered. Mild dependent opacities in both lower lobes, likely atelectasis. Given esophageal dilatation, aspiration is also considered. Subacute right rib fractures. Some interval incomplete fracture healing since October 2020 injury. Stable right upper lobe scarring and benign pulmonary nodules. Aortic Atherosclerosis (ICD10-I70.0). 01/23: Pt admitted to ICU  03/22/19- Patient is clincally improved, of mechanical ventilation.  Starting PT/OT with plan to transition to hospitalist service. 1/28 returned to ICU due to episode of loss of consciousness on MedSurg floor.  This was due to recurrent issues with hypercapnia it was determined patient would need at the very least nighttime ventilation.  Patient placed on mechanical ventilation. 1/29 off vent during day, eating by mouth, speech evaluated.  Nighttime ventilation. 1/30 remains off vent during day, eating by mouth.  Continue nighttime mechanical ventilation.  Referred to DME for Trilogy home  ventilator. 2/01 husband and niece to be trained  on Trilogy vent. 2/02 unable to use Trilogy vent last night due to alarms going off.  DME company needs to review settings 2/03 successful overnight use of Trilogy ventilator   RECENT HOSPITAL ADMISSION AT [facility] Admitted for SOB and worsening hypoxia CT revealing Stenosis at the GE junction concerning for silent aspiration with possible aspiration pneumonia. Patient was recently treated as an outpatient with Azithromycin  on 3/13 for three days for presumed pneumonia. Sputum cultures here were positive for Pseudomonas thought most likely to be from chronic colonization. PSa was felt to be chronic colonization since she returned back to baseline so quickly wihtout direct treatment.   Patient underwent Upper Endoscopy 3/18 with Dr. Laneta Shams with dilation in the middle third and distal portion of the esophagus. Will need follow up with [facility] - Debarah Jess Louann Selmer.   Speech Therapy has also been working with [name] able to safely tolerate a regular diet with mildly (nectar) thick liquids.       CHIEF COMPLAINT:  Chronic respiratory failure vent dependent Status post trach status post PEG History of MRSA pneumonia History of pseudomonal colonization History of aspiration pneumonia    HISTORY OF PRESENT ILLNESS: Chronic trach, vent dependent Cuffed nonfenestrated Tube vocalizes using a finger Patient was prescribed prednisone  and antibiotics on 05/09/23 Patient was admitted to [facility] on  05/11/2023 Diagnosis of aspiration pneumonia Dilatation of the distal esophagus   Patient still needs trilogy ventilator for survival Primary nutritional support via PEG tube Compliance report reviewed in detail 100% compliance Good ventilation Needs this for survival  No exacerbation at this time No evidence of heart failure at this time No evidence or signs of infection at this time No  respiratory distress No fevers, chills, nausea, vomiting, diarrhea No evidence of lower  extremity edema No evidence hemoptysis  Continue frequent suctioning to prevent thick mucous plugs Can use nebulizer treatments scheduled every 6 hours for the next 3 days and then as needed   PAST MEDICAL HISTORY :   has a past medical history of Breast cancer, right (HCC) (2002), Chronic respiratory failure (HCC), Diabetic peripheral neuropathy (HCC) (08/28/2016), GERD (gastroesophageal reflux disease), Glaucoma, High cholesterol, History of colon polyps, Osteoarthritis, Personal history of chemotherapy (2002), Personal history of radiation therapy (2002), S/P percutaneous endoscopic gastrostomy (PEG) tube placement (HCC), T2DM (type 2 diabetes mellitus) (HCC), Tracheostomy dependent (HCC) (2012), Ventral hernia, and Vocal cord paralysis (2012).  has a past surgical history that includes Abdominal hysterectomy; Colonoscopy (2009,12/30/2013); Tracheal surgery (2013); Breast surgery (Right, 2002); Tracheostomy (N/A); Cholecystectomy (N/A, 12/10/2017); Colonoscopy with propofol  (N/A, 04/16/2018); Esophagogastroduodenoscopy (egd) with propofol  (N/A, 10/14/2018); Colon resection (Right, 11/06/2018); Breast lumpectomy (Right, 2002); Breast biopsy (Right, 2008); Breast excisional biopsy (Right, 2002); Breast biopsy (Right, 06/30/2020); Cataract extraction, bilateral; Breast lumpectomy with radiofrequency tag identification (Right, 10/18/2020); XI robotic assisted ventral hernia (N/A, 10/18/2020); IR Replc Gastro/Colonic Tube Percut W/Fluoro (11/24/2021); IR REPLACE G-TUBE SIMPLE WO FLUORO (11/29/2021); IR GASTR TUBE CONVERT GASTR-JEJ PER W/FL MOD SED (01/10/2022); [name] Tube Change (07/30/2022); IR Replc Gastro/Colonic Tube Percut W/Fluoro (05/02/2023); IR REPLACE G-TUBE SIMPLE WO FLUORO (10/10/2023); and IR REPLACE G-TUBE SIMPLE WO FLUORO (10/15/2023). Prior to Admission medications   Medication Sig Start Date End Date Taking?  Authorizing Provider  acetaminophen  (TYLENOL ) 325 MG tablet Take 2 tablets (650 mg total) by mouth every 6 (six) hours as needed for moderate pain or fever. 04/01/19   Angela David Dedra CROME, MD  Biotin 1000 MCG tablet Take 1,000 mcg by mouth daily.     [provider]  busPIRone  (BUSPAR ) 15 MG tablet Take 1 tablet (15 mg total) by mouth 2 (two) times daily. 04/01/19   Angela David Dedra CROME, MD  Calcium  Carbonate-Vitamin D  600-400 MG-UNIT tablet Take 1 tablet by mouth daily.     [provider]  escitalopram  (LEXAPRO ) 20 MG tablet Take 20 mg by mouth daily.    [provider]  feeding supplement, ENSURE ENLIVE, (ENSURE ENLIVE) LIQD Take 237 mLs by mouth 2 (two) times daily between meals. 04/02/19   Angela David Dedra CROME, MD  folic acid  (FOLVITE ) 1 MG tablet Take 1 mg by mouth daily.    [provider]  gabapentin  (NEURONTIN ) 400 MG capsule Take 1 capsule (400 mg total) by mouth 3 (three) times daily. 04/01/19   Angela David Dedra CROME, MD  ipratropium-albuterol  (DUONEB) 0.5-2.5 (3) MG/3ML SOLN Take 3 mLs by nebulization every 6 (six) hours as needed. 04/01/19   Angela David Dedra CROME, MD  iron polysaccharides (FERREX 150) 150 MG capsule Take by mouth. 04/30/18 04/30/19  [provider]  lovastatin  (MEVACOR ) 20 MG tablet Take 20 mg by mouth at bedtime.    [provider]  meclizine  (ANTIVERT ) 25 MG tablet Take 25 mg by mouth 2 (two) times daily as needed for dizziness.     [provider]  Melatonin 5 MG TABS Take 1 tablet (5 mg total) by mouth at bedtime. 04/01/19   Angela David Dedra CROME, MD  metFORMIN  (GLUCOPHAGE ) 1000 MG tablet Take 1,000 mg by mouth 2 (two) times daily as needed (for BS >110 in the morning and >150 in the evening).     [provider]  methocarbamol  (ROBAXIN ) 500 MG tablet Take 1 tablet (500 mg total) by mouth every 8 (eight) hours as needed for muscle spasms. 12/29/18  Schulz, Zachary R, PA-C  metoprolol  tartrate (LOPRESSOR ) 25 MG tablet Take 1  tablet (25 mg total) by mouth 2 (two) times daily. 04/01/19   Angela David Dedra CROME, MD  Nebulizers (COMPRESSOR/NEBULIZER) MISC Nebulizer with supplies, trach collar for med admin. 04/01/19   Angela David Dedra CROME, MD  ondansetron  (ZOFRAN ) 4 MG tablet Take 1 tablet (4 mg total) by mouth every 8 (eight) hours as needed for nausea or vomiting. 11/09/18   Pabon, Diego F, MD  pantoprazole  (PROTONIX ) 40 MG tablet Take 40 mg by mouth daily.     [provider]  RESTASIS  0.05 % ophthalmic emulsion Place 1 drop into both eyes 2 (two) times daily.     [provider]  rOPINIRole  (REQUIP ) 0.5 MG tablet Take 0.5 mg by mouth at bedtime.  06/09/18   [provider]   Allergies  Allergen Reactions   Shellfish Allergy Shortness Of Breath    respitatory distress    Sodium Sulfite Shortness Of Breath    Severe Congestion that creates inability to breath   Sulfa Antibiotics Shortness Of Breath and Other (See Comments)    respitatory distress   Codeine Other (See Comments)    Per patient it kept her up all night   Glucerna [Alimentum] Itching   Wound Dressing Adhesive Rash    FAMILY HISTORY:  family history includes Cancer in her brother, father, and sister. SOCIAL HISTORY:  reports that she has never smoked. She has been exposed to tobacco smoke. She has never used smokeless tobacco. She reports that she does not drink alcohol  and does not use drugs.  BP 100/60   Pulse 75   Temp 99.1 F (37.3 C)   Ht 5' 4 (1.626 m)   Wt 165 lb (74.8 kg)   SpO2 93%   BMI 28.32 kg/m    Physical Examination:   General Appearance: No distress  EYES PERRLA, EOM intact.   NECK Supple, No JVD s/p trach Pulmonary: normal breath sounds, No wheezing.  CardiovascularNormal S1,S2.  No m/r/g.   Abdomen: Benign, Soft, non-tender. Neurology UE/LE 5/5 strength, no focal deficits Ext pulses intact, cap refill intact ALL OTHER ROS ARE NEGATIVE     ASSESSMENT AND PLAN SYNOPSIS  83 year old  pleasant white female seen today for follow-up chronic hypoxic and hypercapnic respiratory failure for MRSA pneumonia with a history of COPD complicated by vocal cord dysfunction status post tracheostomy status post PEG tube placement with chronic ventilatory support, recurrent admission for aspiration pneumonia with dilated esophagus  COPD with chronic respiratory failure Nebulizers as prescribed Aggressive pulmonary toilet and frequent suctioning Chronic respiratory failure needing ventilator at night Chronic proximal respiratory failure and hypercapnic respiratory failure Patient still needs trilogy ventilator therapy at night for survival This helps reduce hospital admissions and recurrent COPD exacerbations  Chronic vocal cord dysfunction Continue tracheostomy ENT in [facility]  Chronic aspiration syndrome Patient now has PEG tube Patient at high risk for aspiration Follow-up with general surgery in [facility] Follow-up GI at [facility] 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 Current dilatation therapy Recommend taking small sips and small bites to prevent aspiration  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 No active infection at this time I do not recommend antibiotics at this time If patient has fevers or increased work of breathing or changes sputum production I will probably start inhaled antibiotics for pseudomonal coverage But at this time no indication for therapy    MEDICATION ADJUSTMENTS/LABS AND TESTS ORDERED: Continue ventilator at night Avoid Allergens  and Irritants Avoid secondhand smoke Avoid SICK contacts Recommend  Masking  when appropriate Recommend Keep up-to-date with vaccinations PLEASE TAKE SMALL SIPS AND SMALL BITES TO PREVENT ASPIRATION Follow up GI doctor   CURRENT MEDICATIONS REVIEWED AT LENGTH WITH PATIENT TODAY   Patient  satisfied with Plan of action and management. All questions answered   Follow up 6  months   I spent a total of 45 minutes dedicated to the care of this patient on the date of this encounter to include pre-visit review of records, face-to-face time with the patient discussing conditions above, post visit ordering of testing, clinical documentation with the electronic health record, making appropriate referrals as documented, and communicating necessary information to the patient's healthcare team.    The Patient requires high complexity decision making for assessment and support, frequent evaluation and titration of therapies, application of advanced monitoring technologies and extensive interpretation of multiple databases.  Patient satisfied with Plan of action and management. All questions answered    Nickolas Alm Cellar, M.D.  [facility]  Medical Director [facility] Medical Director [facility] Cardio-Pulmonary Department

## 2023-11-12 NOTE — Patient Instructions (Signed)
 Excellent Job A+ GOLD STAR!!   Patient Instructions Continue to use ventilator every night, minimum of 4-6 hours a night.    Avoid Allergens and Irritants Avoid secondhand smoke Avoid SICK contacts Recommend  Masking  when appropriate Recommend Keep up-to-date with vaccinations  Continue with Nebulized medications  Aspiration precautions

## 2023-12-05 ENCOUNTER — Ambulatory Visit: Admitting: Internal Medicine

## 2023-12-18 ENCOUNTER — Emergency Department

## 2023-12-18 ENCOUNTER — Inpatient Hospital Stay
Admission: EM | Admit: 2023-12-18 | Discharge: 2023-12-23 | DRG: 208 | Disposition: A | Discharge status: Home Health | Attending: Internal Medicine | Admitting: Internal Medicine

## 2023-12-18 ENCOUNTER — Other Ambulatory Visit: Payer: Self-pay

## 2023-12-18 DIAGNOSIS — E1122 Type 2 diabetes mellitus with diabetic chronic kidney disease: Secondary | ICD-10-CM | POA: Diagnosis present

## 2023-12-18 DIAGNOSIS — J383 Other diseases of vocal cords: Secondary | ICD-10-CM | POA: Diagnosis present

## 2023-12-18 DIAGNOSIS — Z923 Personal history of irradiation: Secondary | ICD-10-CM | POA: Diagnosis not present

## 2023-12-18 DIAGNOSIS — I5032 Chronic diastolic (congestive) heart failure: Secondary | ICD-10-CM | POA: Diagnosis present

## 2023-12-18 DIAGNOSIS — Z1152 Encounter for screening for COVID-19: Secondary | ICD-10-CM | POA: Diagnosis not present

## 2023-12-18 DIAGNOSIS — F32A Depression, unspecified: Secondary | ICD-10-CM | POA: Diagnosis present

## 2023-12-18 DIAGNOSIS — F419 Anxiety disorder, unspecified: Secondary | ICD-10-CM | POA: Diagnosis present

## 2023-12-18 DIAGNOSIS — Z885 Allergy status to narcotic agent status: Secondary | ICD-10-CM

## 2023-12-18 DIAGNOSIS — Z90710 Acquired absence of both cervix and uterus: Secondary | ICD-10-CM

## 2023-12-18 DIAGNOSIS — J9601 Acute respiratory failure with hypoxia: Principal | ICD-10-CM

## 2023-12-18 DIAGNOSIS — Z931 Gastrostomy status: Secondary | ICD-10-CM | POA: Diagnosis not present

## 2023-12-18 DIAGNOSIS — Z930 Tracheostomy status: Secondary | ICD-10-CM

## 2023-12-18 DIAGNOSIS — Z794 Long term (current) use of insulin: Secondary | ICD-10-CM

## 2023-12-18 DIAGNOSIS — Z888 Allergy status to other drugs, medicaments and biological substances status: Secondary | ICD-10-CM

## 2023-12-18 DIAGNOSIS — E1129 Type 2 diabetes mellitus with other diabetic kidney complication: Secondary | ICD-10-CM | POA: Diagnosis not present

## 2023-12-18 DIAGNOSIS — Z8614 Personal history of Methicillin resistant Staphylococcus aureus infection: Secondary | ICD-10-CM

## 2023-12-18 DIAGNOSIS — Z6828 Body mass index (BMI) 28.0-28.9, adult: Secondary | ICD-10-CM | POA: Diagnosis not present

## 2023-12-18 DIAGNOSIS — L089 Local infection of the skin and subcutaneous tissue, unspecified: Secondary | ICD-10-CM | POA: Diagnosis present

## 2023-12-18 DIAGNOSIS — J9622 Acute and chronic respiratory failure with hypercapnia: Secondary | ICD-10-CM | POA: Diagnosis present

## 2023-12-18 DIAGNOSIS — K219 Gastro-esophageal reflux disease without esophagitis: Secondary | ICD-10-CM | POA: Diagnosis present

## 2023-12-18 DIAGNOSIS — E1140 Type 2 diabetes mellitus with diabetic neuropathy, unspecified: Secondary | ICD-10-CM | POA: Diagnosis present

## 2023-12-18 DIAGNOSIS — E1142 Type 2 diabetes mellitus with diabetic polyneuropathy: Secondary | ICD-10-CM | POA: Diagnosis present

## 2023-12-18 DIAGNOSIS — Z860100 Personal history of colon polyps, unspecified: Secondary | ICD-10-CM

## 2023-12-18 DIAGNOSIS — N1831 Chronic kidney disease, stage 3a: Secondary | ICD-10-CM | POA: Diagnosis present

## 2023-12-18 DIAGNOSIS — M6289 Other specified disorders of muscle: Secondary | ICD-10-CM | POA: Diagnosis present

## 2023-12-18 DIAGNOSIS — E7800 Pure hypercholesterolemia, unspecified: Secondary | ICD-10-CM | POA: Diagnosis present

## 2023-12-18 DIAGNOSIS — J8410 Pulmonary fibrosis, unspecified: Secondary | ICD-10-CM | POA: Diagnosis present

## 2023-12-18 DIAGNOSIS — Z91013 Allergy to seafood: Secondary | ICD-10-CM

## 2023-12-18 DIAGNOSIS — J9621 Acute and chronic respiratory failure with hypoxia: Secondary | ICD-10-CM | POA: Diagnosis present

## 2023-12-18 DIAGNOSIS — Z882 Allergy status to sulfonamides status: Secondary | ICD-10-CM

## 2023-12-18 DIAGNOSIS — Z9221 Personal history of antineoplastic chemotherapy: Secondary | ICD-10-CM | POA: Diagnosis not present

## 2023-12-18 DIAGNOSIS — J690 Pneumonitis due to inhalation of food and vomit: Secondary | ICD-10-CM | POA: Diagnosis present

## 2023-12-18 DIAGNOSIS — Z79899 Other long term (current) drug therapy: Secondary | ICD-10-CM

## 2023-12-18 DIAGNOSIS — Z853 Personal history of malignant neoplasm of breast: Secondary | ICD-10-CM

## 2023-12-18 DIAGNOSIS — F418 Other specified anxiety disorders: Secondary | ICD-10-CM | POA: Diagnosis present

## 2023-12-18 DIAGNOSIS — E663 Overweight: Secondary | ICD-10-CM | POA: Diagnosis present

## 2023-12-18 DIAGNOSIS — I272 Pulmonary hypertension, unspecified: Secondary | ICD-10-CM | POA: Diagnosis present

## 2023-12-18 LAB — RESP PANEL BY RT-PCR (RSV, FLU A&B, COVID)  RVPGX2
Influenza A by PCR: NEGATIVE
Influenza B by PCR: NEGATIVE
Resp Syncytial Virus by PCR: NEGATIVE
SARS Coronavirus 2 by RT PCR: NEGATIVE

## 2023-12-18 LAB — COMPREHENSIVE METABOLIC PANEL WITH GFR
ALT: 44 U/L (ref 0–44)
AST: 90 U/L — ABNORMAL HIGH (ref 15–41)
Albumin: 3.6 g/dL (ref 3.5–5.0)
Alkaline Phosphatase: 85 U/L (ref 38–126)
Anion gap: 12 (ref 5–15)
BUN: 24 mg/dL — ABNORMAL HIGH (ref 8–23)
CO2: 25 mmol/L (ref 22–32)
Calcium: 9.6 mg/dL (ref 8.9–10.3)
Chloride: 98 mmol/L (ref 98–111)
Creatinine, Ser: 0.89 mg/dL (ref 0.44–1.00)
GFR, Estimated: >60 mL/min (ref >60)
Glucose, Bld: 286 mg/dL — ABNORMAL HIGH (ref 70–99)
Potassium: 4.6 mmol/L (ref 3.5–5.1)
Sodium: 135 mmol/L (ref 135–145)
Total Bilirubin: 1 mg/dL (ref 0.0–1.2)
Total Protein: 8.9 g/dL — ABNORMAL HIGH (ref 6.5–8.1)

## 2023-12-18 LAB — URINALYSIS, W/ REFLEX TO CULTURE (INFECTION SUSPECTED)
Bilirubin Urine: NEGATIVE
Glucose, UA: >=500 mg/dL — AB
Hgb urine dipstick: NEGATIVE
Ketones, ur: NEGATIVE mg/dL
Nitrite: NEGATIVE
Protein, ur: 100 mg/dL — AB
Specific Gravity, Urine: 1.012 (ref 1.005–1.030)
WBC, UA: >50 WBC/hpf (ref 0–5)
pH: 6 (ref 5.0–8.0)

## 2023-12-18 LAB — CBC
HCT: 45.9 % (ref 36.0–46.0)
Hemoglobin: 14.9 g/dL (ref 12.0–15.0)
MCH: 28.1 pg (ref 26.0–34.0)
MCHC: 32.5 g/dL (ref 30.0–36.0)
MCV: 86.4 fL (ref 80.0–100.0)
Platelets: 224 K/uL (ref 150–400)
RBC: 5.31 MIL/uL — ABNORMAL HIGH (ref 3.87–5.11)
RDW: 13.1 % (ref 11.5–15.5)
WBC: 17.3 K/uL — ABNORMAL HIGH (ref 4.0–10.5)
nRBC: 0 % (ref 0.0–0.2)

## 2023-12-18 LAB — LACTIC ACID, PLASMA
Lactic Acid, Venous: 2.9 mmol/L (ref 0.5–1.9)
Lactic Acid, Venous: 2.8 mmol/L (ref 0.5–1.9)

## 2023-12-18 LAB — CBG MONITORING, ED: Glucose-Capillary: 314 mg/dL — ABNORMAL HIGH (ref 70–99)

## 2023-12-18 MED ORDER — ADULT MULTIVITAMIN W/MINERALS CH
1.0000 | ORAL_TABLET | Freq: Every day | ORAL | Status: DC
  Administered 2023-12-19 – 2023-12-21 (×2): 1
  Filled 2023-12-18 (×2): qty 1

## 2023-12-18 MED ORDER — METOPROLOL TARTRATE 25 MG PO TABS
12.5000 mg | ORAL_TABLET | Freq: Two times a day (BID) | ORAL | Status: DC
  Administered 2023-12-19 – 2023-12-21 (×4): 12.5 mg
  Filled 2023-12-18 (×5): qty 1

## 2023-12-18 MED ORDER — IPRATROPIUM-ALBUTEROL 0.5-2.5 (3) MG/3ML IN SOLN
3.0000 mL | RESPIRATORY_TRACT | Status: DC
  Administered 2023-12-18 – 2023-12-20 (×11): 3 mL via RESPIRATORY_TRACT
  Filled 2023-12-18 (×11): qty 3

## 2023-12-18 MED ORDER — OSMOLITE 1.2 CAL PO LIQD
1000.0000 mL | ORAL | Status: DC
  Administered 2023-12-18: 1000 mL

## 2023-12-18 MED ORDER — ALBUTEROL SULFATE (2.5 MG/3ML) 0.083% IN NEBU: 2.5000 mg | INHALATION_SOLUTION | RESPIRATORY_TRACT | Status: DC | PRN

## 2023-12-18 MED ORDER — GUAIFENESIN 100 MG/5ML PO LIQD: 200.0000 mg | ORAL | Status: DC | PRN

## 2023-12-18 MED ORDER — POLYVINYL ALCOHOL 1.4 % OP SOLN
1.0000 [drp] | Freq: Two times a day (BID) | OPHTHALMIC | Status: DC
  Administered 2023-12-19 – 2023-12-23 (×9): 1 [drp] via OPHTHALMIC
  Filled 2023-12-18 (×4): qty 15

## 2023-12-18 MED ORDER — VANCOMYCIN HCL IN DEXTROSE 1-5 GM/200ML-% IV SOLN
1000.0000 mg | Freq: Once | INTRAVENOUS | Status: AC
  Administered 2023-12-18: 1000 mg via INTRAVENOUS
  Filled 2023-12-18: qty 200

## 2023-12-18 MED ORDER — ENOXAPARIN SODIUM 40 MG/0.4ML IJ SOSY
40.0000 mg | PREFILLED_SYRINGE | INTRAMUSCULAR | Status: DC
  Administered 2023-12-18 – 2023-12-22 (×5): 40 mg via SUBCUTANEOUS
  Filled 2023-12-18 (×5): qty 0.4

## 2023-12-18 MED ORDER — ESCITALOPRAM OXALATE 20 MG PO TABS
20.0000 mg | ORAL_TABLET | Freq: Every day | ORAL | Status: DC
  Administered 2023-12-19 – 2023-12-21 (×2): 20 mg via ORAL
  Filled 2023-12-18: qty 1
  Filled 2023-12-18: qty 2

## 2023-12-18 MED ORDER — GABAPENTIN 400 MG PO CAPS
400.0000 mg | ORAL_CAPSULE | Freq: Three times a day (TID) | ORAL | Status: DC
  Administered 2023-12-18 – 2023-12-21 (×8): 400 mg
  Filled 2023-12-18 (×8): qty 1

## 2023-12-18 MED ORDER — SODIUM CHLORIDE 0.9 % IV SOLN
2.0000 g | Freq: Once | INTRAVENOUS | Status: AC
  Administered 2023-12-18: 2 g via INTRAVENOUS
  Filled 2023-12-18: qty 12.5

## 2023-12-18 MED ORDER — INSULIN ASPART 100 UNIT/ML IJ SOLN: 0.0000 [IU] | Freq: Three times a day (TID) | INTRAMUSCULAR | Status: DC

## 2023-12-18 MED ORDER — SODIUM CHLORIDE 0.9 % IV SOLN
500.0000 mg | Freq: Once | INTRAVENOUS | Status: AC
  Administered 2023-12-18: 500 mg via INTRAVENOUS
  Filled 2023-12-18: qty 5

## 2023-12-18 MED ORDER — ROPINIROLE HCL 0.25 MG PO TABS
0.5000 mg | ORAL_TABLET | Freq: Every day | ORAL | Status: DC
  Administered 2023-12-18 – 2023-12-20 (×3): 0.5 mg
  Filled 2023-12-18 (×4): qty 2
  Filled 2023-12-18: qty 1

## 2023-12-18 MED ORDER — POLYETHYLENE GLYCOL 3350 17 G PO PACK: 17.0000 g | PACK | Freq: Every day | ORAL | Status: DC | PRN

## 2023-12-18 MED ORDER — ACETAMINOPHEN 325 MG PO TABS: 325.0000 mg | ORAL_TABLET | Freq: Four times a day (QID) | ORAL | Status: DC | PRN

## 2023-12-18 MED ORDER — SODIUM CHLORIDE 0.9 % IV BOLUS (SEPSIS)
500.0000 mL | Freq: Once | INTRAVENOUS | Status: AC
  Administered 2023-12-18: 500 mL via INTRAVENOUS

## 2023-12-18 MED ORDER — PANTOPRAZOLE SODIUM 40 MG PO TBEC
40.0000 mg | DELAYED_RELEASE_TABLET | Freq: Every day | ORAL | Status: DC
  Administered 2023-12-19 – 2023-12-21 (×2): 40 mg via ORAL
  Filled 2023-12-18 (×2): qty 1

## 2023-12-18 MED ORDER — EZETIMIBE 10 MG PO TABS
10.0000 mg | ORAL_TABLET | Freq: Every day | ORAL | Status: DC
  Administered 2023-12-18 – 2023-12-21 (×3): 10 mg
  Filled 2023-12-18 (×3): qty 1

## 2023-12-18 MED ORDER — FOLIC ACID 1 MG PO TABS
1.0000 mg | ORAL_TABLET | Freq: Every day | ORAL | Status: DC
  Administered 2023-12-19 – 2023-12-21 (×2): 1 mg via ORAL
  Filled 2023-12-18 (×3): qty 1

## 2023-12-18 MED ORDER — SODIUM CHLORIDE 0.9 % IV BOLUS
500.0000 mL | Freq: Once | INTRAVENOUS | Status: AC
  Administered 2023-12-18: 500 mL via INTRAVENOUS

## 2023-12-18 MED ORDER — ONDANSETRON HCL 4 MG/2ML IJ SOLN: 4.0000 mg | Freq: Three times a day (TID) | INTRAMUSCULAR | Status: DC | PRN

## 2023-12-18 MED ORDER — INSULIN GLARGINE-YFGN 100 UNIT/ML ~~LOC~~ SOLN
20.0000 [IU] | Freq: Every day | SUBCUTANEOUS | Status: DC
  Administered 2023-12-19 – 2023-12-23 (×4): 20 [IU] via SUBCUTANEOUS
  Filled 2023-12-18 (×5): qty 0.2

## 2023-12-18 MED ORDER — BUPROPION HCL ER (SR) 150 MG PO TB12
150.0000 mg | ORAL_TABLET | Freq: Two times a day (BID) | ORAL | Status: DC
  Administered 2023-12-18 – 2023-12-23 (×8): 150 mg via ORAL
  Filled 2023-12-18 (×13): qty 1

## 2023-12-18 MED ORDER — MELATONIN 5 MG PO TABS
5.0000 mg | ORAL_TABLET | Freq: Every evening | ORAL | Status: DC | PRN
  Administered 2023-12-18: 5 mg
  Filled 2023-12-18: qty 1

## 2023-12-18 MED ORDER — FREE WATER
50.0000 mL | Status: DC
  Administered 2023-12-18 – 2023-12-23 (×23): 50 mL
  Filled 2023-12-18 (×9): qty 50

## 2023-12-18 MED ORDER — INSULIN ASPART 100 UNIT/ML IJ SOLN
0.0000 [IU] | Freq: Every day | INTRAMUSCULAR | Status: DC
  Administered 2023-12-18: 4 [IU] via SUBCUTANEOUS
  Filled 2023-12-18: qty 1

## 2023-12-18 NOTE — Consult Note (Addendum)
 CODE SEPSIS - PHARMACY COMMUNICATION  **Broad Spectrum Antibiotics should be administered within 1 hour of Sepsis diagnosis**  Time Code Sepsis Called/Page Received: 1716  Antibiotics Ordered: cefepime , azithromycin , vancomycin   Time of 1st antibiotic administration: 1733  Additional action taken by pharmacy: n/a  If necessary, Name of Provider/Nurse Contacted: n/a    Milee Qualls A Onia Shiflett ,PharmD Clinical Pharmacist  12/18/2023  5:51 PM

## 2023-12-18 NOTE — ED Notes (Signed)
 Fall risk bundle in place.

## 2023-12-18 NOTE — Sepsis Progress Note (Signed)
 eLink is following this Code Sepsis.

## 2023-12-18 NOTE — ED Notes (Addendum)
 Pt placed on ventilator by RT at this time.

## 2023-12-18 NOTE — ED Notes (Signed)
 Update given over the phone to niece Tonda Gull.

## 2023-12-18 NOTE — ED Notes (Signed)
 Dr Levander notified of lactic 2.9. orders to be placed as needed

## 2023-12-18 NOTE — ED Provider Notes (Signed)
 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  Patient presents from home.  She called neighbor to tell her that she is feeling short of breath.  Reports that this started this morning.  Says this has happened in the past she thinks most recently a few months ago.  Does not typically use any sort of supplemental oxygen during the day.  Thinks her trach was last changed about 2 months ago.  On EMS arrival patient was cyanotic with a room air saturation of 58%.  They placed her with a nonrebreather over her face and trach with improved oxygenation saturations.  Received 125 mg of Solu-Medrol.  Reviewed her discharge summary from 10/15/2023.  At that time patient presented with difficulty breathing.  Found to have leukocytosis and hypoxia.  X-Kanin Lia without focal consolidation.  Patient was treated for UTI as well as aspiration pneumonia with improvement.      Physical Exam   Triage Vital Signs: ED Triage Vitals  Encounter Vitals Group     BP 12/18/23 1643 (!) 141/71     Girls Systolic BP Percentile --      Girls Diastolic BP Percentile --      Boys Systolic BP Percentile --      Boys Diastolic BP Percentile --      Pulse Rate 12/18/23 1643 98     Resp 12/18/23 1643 (!) 24     Temp --      Temp src --      SpO2 12/18/23 1643 100 %     Weight 12/18/23 1640 170 lb (77.1 kg)     Height 12/18/23 1640 5' 5 (1.651 m)     Head Circumference --      Peak Flow --      Pain Score 12/18/23 1638 0     Pain Loc --      Pain Education --      Exclude from Growth Chart --     Most recent vital signs: Vitals:   12/18/23 1930  12/18/23 2011  BP: (!) 103/52   Pulse: 89   Resp: (!) 26   Temp:  98.6 F (37 C)  SpO2: 100%      General: Awake, interactive  CV:  Good peripheral perfusion Resp:  Tachypneic, coarse lung sounds diffusely, trach in place with overlying nonrebreather from EMS.  Crusting is noted around the trach site without obvious visible obstruction. Abd:  Nondistended.  Neuro:  No gross facial asymmetry, mouthing words since patient reports is her baseline   ED Results / Procedures / Treatments   Labs (all labs ordered are listed, but only abnormal results are displayed) Labs Reviewed  CBC - Abnormal; Notable for the following components:      Result Value   WBC 17.3 (*)    RBC 5.31 (*)    All other components within normal limits  COMPREHENSIVE METABOLIC PANEL WITH GFR - Abnormal; Notable for the following components:   Glucose, Bld 286 (*)    BUN 24 (*)    Total Protein 8.9 (*)    AST 90 (*)    All other components  within normal limits  LACTIC ACID, PLASMA - Abnormal; Notable for the following components:   Lactic Acid, Venous 2.9 (*)    All other components within normal limits  LACTIC ACID, PLASMA - Abnormal; Notable for the following components:   Lactic Acid, Venous 2.8 (*)    All other components within normal limits  URINALYSIS, W/ REFLEX TO CULTURE (INFECTION SUSPECTED) - Abnormal; Notable for the following components:   Color, Urine YELLOW (*)    APPearance CLOUDY (*)    Glucose, UA >=500 (*)    Protein, ur 100 (*)    Leukocytes,Ua LARGE (*)    Bacteria, UA FEW (*)    All other components within normal limits  CBG MONITORING, ED - Abnormal; Notable for the following components:   Glucose-Capillary 314 (*)    All other components within normal limits  RESP PANEL BY RT-PCR (RSV, FLU A&B, COVID)  [id number]  CULTURE, BLOOD (ROUTINE X 2)  CULTURE, BLOOD (ROUTINE X 2)  EXPECTORATED SPUTUM ASSESSMENT W GRAM STAIN, RFLX TO RESP C  BRAIN NATRIURETIC PEPTIDE  STREP PNEUMONIAE  URINARY ANTIGEN  LEGIONELLA PNEUMOPHILA SEROGP 1 UR AG  CBC  BASIC METABOLIC PANEL WITH GFR     EKG EKG independently reviewed and interpreted by myself demonstrates:  EKG demonstrates sinus rhythm at a rate of 98, PR 235, QRS 95, QTc 474, nonspecific ST changes, no STEMI  RADIOLOGY Imaging independently reviewed and interpreted by myself demonstrates:  CXR with interstitial prominence, per radiology similar to prior though questionable consolidation noted over the right lung on my review  Formal Radiology Read:  DG Chest Port 1 View Result Date: 12/18/2023 EXAM: 1 VIEW(S) XRAY OF THE CHEST 12/18/2023 05:22:56 PM COMPARISON: 10/09/2023 CLINICAL HISTORY: Shortness of breath 10026. SOB FINDINGS: LINES, TUBES AND DEVICES: Tracheostomy tube in place. Metallic device in left hilum. Surgical clips in right axilla. LUNGS AND PLEURA: Elevated right hemidiaphragm. Small bilateral pleural effusion. Persistent bilateral interstitial prominence, similar to prior exam. No pneumothorax. HEART AND MEDIASTINUM: No acute abnormality of the cardiac and mediastinal silhouettes. BONES AND SOFT TISSUES: No acute osseous abnormality. IMPRESSION: 1. Persistent bilateral interstitial prominence, similar to prior exam and favoring edema. 2. Small bilateral pleural effusions. Electronically signed by: Norman Gatlin MD 12/18/2023 05:26 PM EDT RP Workstation: [id number]    PROCEDURES:  Critical Care performed: Yes, see critical care procedure note(s)  CRITICAL CARE Performed by: Nilsa Dade   Total critical care time: 31 minutes  Critical care time was exclusive of separately billable procedures and treating other patients.  Critical care was necessary to treat or prevent imminent or life-threatening deterioration.  Critical care was time spent personally by me on the following activities: development of treatment plan with patient and/or surrogate as well as nursing, discussions with consultants, evaluation of  patient's response to treatment, examination of patient, obtaining history from patient or surrogate, ordering and performing treatments and interventions, ordering and review of laboratory studies, ordering and review of radiographic studies, pulse oximetry and re-evaluation of patient's condition.   Procedures   MEDICATIONS ORDERED IN ED: Medications  ipratropium-albuterol  (DUONEB) 0.5-2.5 (3) MG/3ML nebulizer solution 3 mL (3 mLs Nebulization Given 12/18/23 2114)  albuterol  (PROVENTIL ) (2.5 MG/3ML) 0.083% nebulizer solution 2.5 mg (has no administration in time range)  guaiFENesin  (ROBITUSSIN) 100 MG/5ML liquid 200 mg (has no administration in time range)  ondansetron  (ZOFRAN ) injection 4 mg (has no administration in time range)  acetaminophen  (TYLENOL ) tablet 325 mg (has no administration in time range)  insulin  aspart (novoLOG )  injection 0-9 Units (has no administration in time range)  insulin  aspart (novoLOG ) injection 0-5 Units (4 Units Subcutaneous Given 12/18/23 2114)  ezetimibe  (ZETIA ) tablet 10 mg (has no administration in time range)  metoprolol  tartrate (LOPRESSOR ) tablet 12.5 mg (has no administration in time range)  buPROPion  (WELLBUTRIN  SR) 12 hr tablet 150 mg (has no administration in time range)  escitalopram  (LEXAPRO ) tablet 20 mg (has no administration in time range)  insulin  glargine-yfgn (SEMGLEE ) injection 20 Units (has no administration in time range)  pantoprazole  (PROTONIX ) EC tablet 40 mg (has no administration in time range)  polyethylene glycol (MIRALAX  / GLYCOLAX ) packet 17 g (has no administration in time range)  folic acid  (FOLVITE ) tablet 1 mg (has no administration in time range)  melatonin tablet 5 mg (has no administration in time range)  free water  50 mL (has no administration in time range)  gabapentin  (NEURONTIN ) capsule 400 mg (has no administration in time range)  rOPINIRole  (REQUIP ) tablet 0.5 mg (has no administration in time range)  multivitamin  with minerals tablet 1 tablet (has no administration in time range)  Propylene Glycol 0.6 % SOLN 1 drop (has no administration in time range)  feeding supplement (OSMOLITE 1.2 CAL) liquid 1,000 mL (has no administration in time range)  enoxaparin  (LOVENOX ) injection 40 mg (has no administration in time range)  sodium chloride  0.9 % bolus 500 mL (0 mLs Intravenous Stopped 12/18/23 1836)  vancomycin  (VANCOCIN ) IVPB 1000 mg/200 mL premix (0 mg Intravenous Stopped 12/18/23 2057)  ceFEPIme  (MAXIPIME ) 2 g in sodium chloride  0.9 % 100 mL IVPB (0 g Intravenous Stopped 12/18/23 1822)  azithromycin  (ZITHROMAX ) 500 mg in sodium chloride  0.9 % 250 mL IVPB (0 mg Intravenous Stopped 12/18/23 1940)  sodium chloride  0.9 % bolus 500 mL (0 mLs Intravenous Stopped 12/18/23 2159)     IMPRESSION / MDM / ASSESSMENT AND PLAN / ED COURSE  I reviewed the triage vital signs and the nursing notes.  Differential diagnosis includes, but is not limited to, pneumonia, tracheitis, partial obstruction of trach, no noted history of COPD  Patient's presentation is most consistent with acute presentation with potential threat to life or bodily function.  83 year old female presenting with shortness of breath, profoundly hypoxic on EMS arrival, improved with nonrebreather over her trach site.  Patient's trach was exchanged by respiratory therapy, Shiley cuffed #6.  They did note some crusting on this that may potentially have caused some obstruction.  Following this, patient was able to be transition to blow-by oxygen at 10 L.  Labs with leukocytosis to BC 17.3, normal hemoglobin.  CMP with mild hyperglycemia without evidence of DKA.  Lactate elevated at 2.9.  Questionable edema on chest x-Blaize Epple so given small fluid bolus.  Sepsis orders initiated with empiric cefepime , azithromycin , vancomycin  given prior healthcare exposure, known pseudomonal colonization.  Do think patient is appropriate for admission given her new daytime oxygen  requirement, significant initial hypoxia.  Will reach out to hospitalist team.  Case discussed with Dr. Hilma.  He will evaluate for anticipated admission.     FINAL CLINICAL IMPRESSION(S) / ED DIAGNOSES   Final diagnoses:  Acute respiratory failure with hypoxia (HCC)     Rx / DC Orders   ED Discharge Orders     None        Note:  This document was prepared using Dragon voice recognition software and may include unintentional dictation errors.   Levander Slate, MD 12/18/23 2230

## 2023-12-18 NOTE — H&P (Incomplete)
 History and Physical    Angela David FMW:[id number] DOB: October 13, 1940 DOA: 12/18/2023  Referring MD/NP/PA:   PCP: Angela Ozell BIRCH, MD   Chief Complaint: SOB   HPI: Angela David is a 83 y.o. 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  Patient states that her shortness of breath has been progressively worsening since this morning.  She has dry cough, no chest pain, fever or chills.  She is normally not using oxygen during the daytime, but was found to have acute respiratory distress, oxygen desaturation to 50% on room air, requiring nonrebreather.  Patient denies nausea, vomiting, diarrhea or abdominal pain.  No symptoms of UTI.  Data reviewed independently and ED Course: pt was found to have WBC 17.3, GFR> 60, negative PCR for COVID, flu and RSV, pending BNP, lactic acid 2.9, temperature normal, soft blood pressure, heart rate 80-90s, RR 24-27, oxygen saturation 100% on 10 L oxygen now.  Patient is admitted to stepdown as inpatient.  Chest x-ray: 1. Persistent bilateral interstitial prominence, similar to prior exam and favoring edema. 2. Small bilateral pleural effusions.   EKG: I have personally reviewed.  Sinus rhythm, QTc 474, LAE, nonspecific T wave change.   Review of Systems:   General: no fevers, chills, no body weight gain, has fatigue HEENT: no blurry vision, hearing changes or sore throat Respiratory: has dyspnea, coughing CV: no chest pain, no palpitations GI: no nausea, vomiting, abdominal pain, diarrhea, constipation GU: no dysuria, burning on urination, increased urinary frequency,  hematuria  Ext: has trace leg edema Neuro: no unilateral weakness, numbness, or tingling, no vision change or hearing loss Skin: no rash, no skin tear. MSK: No muscle spasm, no deformity, no limitation of range of movement in spin Heme: No easy bruising.  Travel history: No recent long distant travel.   Allergy:  Allergies  Allergen Reactions   Shellfish Allergy Shortness Of Breath    respitatory distress    Sodium Sulfite Shortness Of Breath    Severe Congestion that creates inability to breath   Sulfa Antibiotics Shortness Of Breath and Other (See Comments)    respitatory distress   Codeine Other (See Comments)    Per patient it kept her up all night   Glucerna [Alimentum] Itching   Wound Dressing Adhesive Rash    Past Medical History:  Diagnosis Date   Breast cancer, right (HCC) 2002   Tx'd with chemotherapy and XRT   Chronic respiratory failure (HCC)    s/p tracheostomy in 2012; on ventilatory support at hs   Diabetic peripheral neuropathy (HCC) 08/28/2016   GERD (gastroesophageal reflux disease)    Glaucoma    High cholesterol    History of colon polyps    Osteoarthritis    Personal history of chemotherapy 2002   BREAST CA   Personal history of radiation therapy 2002   BREAST CA   S/P percutaneous endoscopic gastrostomy (PEG) tube placement (HCC)    T2DM (type 2 diabetes mellitus) (HCC)    Tracheostomy dependent (HCC) 2012   Ventral hernia    Vocal cord paralysis 2012    Past Surgical History:  Procedure Laterality Date   ABDOMINAL HYSTERECTOMY     BREAST BIOPSY Right 2008   neg  BREAST BIOPSY Right 06/30/2020   Q clip-path pending   BREAST EXCISIONAL BIOPSY Right 2002   positive   BREAST LUMPECTOMY Right 2002   chemo and radiation   BREAST LUMPECTOMY WITH RADIOFREQUENCY TAG IDENTIFICATION Right 10/18/2020   Procedure: BREAST LUMPECTOMY WITH RADIOFREQUENCY TAG IDENTIFICATION;  Surgeon: Angela Laneta FALCON, MD;  Location: [facility];  Service: General;   Laterality: Right;   BREAST SURGERY Right 2002   LUMPECTOMY, radiation, chemotherapy   CATARACT EXTRACTION, BILATERAL     CHOLECYSTECTOMY N/A 12/10/2017   Procedure: LAPAROSCOPIC CHOLECYSTECTOMY;  Surgeon: Angela Millet, DO;  Location: [facility];  Service: General;  Laterality: N/A;   COLON RESECTION Right 11/06/2018   Procedure: HAND ASSISTED LAPAROSCOPIC COLON RESECTION, RIGHT;  Surgeon: Angela Laneta FALCON, MD;  Location: [facility];  Service: General;  Laterality: Right;   COLONOSCOPY  2009,12/30/2013   COLONOSCOPY WITH PROPOFOL  N/A 04/16/2018   Tubulovillous polyp proximal transverse colon, large sessile polyp, proximal transverse colon;  Surgeon: Angela Reyes ORN, MD;  Location: [facility];  Service: Endoscopy;  Laterality: N/A;   ESOPHAGOGASTRODUODENOSCOPY (EGD) WITH PROPOFOL  N/A 10/14/2018   Procedure: ESOPHAGOGASTRODUODENOSCOPY (EGD) WITH PROPOFOL ;  Surgeon: Angela Carmine, MD;  Location: [facility];  Service: Endoscopy;  Laterality: N/A;   IR GASTR TUBE CONVERT GASTR-JEJ PER W/FL MOD SED  01/10/2022   [name] TUBE CHANGE  07/30/2022   IR REPLACE G-TUBE SIMPLE WO FLUORO  11/29/2021   IR REPLACE G-TUBE SIMPLE WO FLUORO  10/10/2023   IR REPLACE G-TUBE SIMPLE WO FLUORO  10/15/2023   IR REPLC GASTRO/COLONIC TUBE PERCUT W/FLUORO  11/24/2021   IR REPLC GASTRO/COLONIC TUBE PERCUT W/FLUORO  05/02/2023   TRACHEAL SURGERY  2013   TRACHEOSTOMY N/A    Dr Angela David   XI ROBOTIC ASSISTED VENTRAL HERNIA N/A 10/18/2020   Procedure: XI ROBOTIC ASSISTED VENTRAL HERNIA;  Surgeon: Angela Laneta FALCON, MD;  Location: [facility];  Service: General;  Laterality: N/A;    Social History:  reports that she has never smoked. She has been exposed to tobacco smoke. She has never used smokeless tobacco. She reports that she does not drink alcohol  and does not use drugs.  Family History:  Family History  Problem Relation Age of Onset   Cancer Father        bone cancer   Cancer Sister        lymphoma   Cancer Brother     Breast cancer Neg Hx      Prior to Admission medications   Medication Sig Start Date End Date Taking? Authorizing Provider  acetaminophen  (TYLENOL ) 500 MG tablet Take 500-1,000 mg by mouth every 6 (six) hours as needed.    [provider]  buPROPion  (WELLBUTRIN  SR) 150 MG 12 hr tablet Take 150 mg by mouth 2 (two) times daily. 11/13/21   [provider]  Cholecalciferol  50 MCG (2000 UT) CAPS Take 2,000 Units by mouth daily.    [provider]  clotrimazole (LOTRIMIN) 1 % cream APPLY A THIN LAYER TO THE AFFECTED AREA 2 TIMES DAILY    [provider]  Continuous Blood Gluc Sensor (FREESTYLE LIBRE 2 SENSOR) MISC 1 Device by Does not apply route daily. 07/06/21   Keene, Jeremiah D, NP  denosumab  (PROLIA ) 60 MG/ML SOSY injection Inject 1 mL by subcutaneous route.    [provider]  diphenhydrAMINE  (BENADRYL ) 25 mg capsule Take 1 capsule (25 mg total) by mouth every 6 (six) hours as needed for allergies or itching. 03/06/21   Shellia Inge BIRCH,  NP  EPINEPHrine  0.3 mg/0.3 mL IJ SOAJ injection Inject 0.3 mLs (0.3 mg dose) into the muscle once as needed for Anaphylaxis for up to 1 dose. 09/10/18   [provider]  escitalopram  (LEXAPRO ) 20 MG tablet TAKE 1 TABLET VIA TUBE DAILY. TAKE ALONG WITH 10 MG DOSE. 11/13/21   [provider]  estradiol  (ESTRACE ) 0.1 MG/GM vaginal cream Insert 1 applicatorful twice a week by vaginal route for 30 days.    [provider]  ezetimibe  (ZETIA ) 10 MG tablet Take 10 mg by mouth daily. 11/20/21   [provider]  folic acid  (FOLVITE ) 1 MG tablet Take 1 tablet by mouth daily. 12/21/19   [provider]  gabapentin  (NEURONTIN ) 300 MG capsule Place 300 mg into feeding tube every 8 (eight) hours.    [provider]  HUMALOG KWIKPEN 100 UNIT/ML KwikPen Inject 0-5 Units into the skin 3 x daily with food. 11/06/21   [provider]  Insulin  Pen Needle (PEN NEEDLES 3/16) 31G X 5 MM  MISC 1 Container by Does not apply route daily. 07/06/21   Keene, Jeremiah D, NP  ipratropium-albuterol  (DUONEB) 0.5-2.5 (3) MG/3ML SOLN Take 3 mLs by nebulization every 6 (six) hours as needed. 05/09/23   Kasa, Kurian, MD  loratadine  (CLARITIN ) 10 MG tablet Place 1 tablet (10 mg total) into feeding tube daily. 03/07/21   Shellia Inge BIRCH, NP  lovastatin  (MEVACOR ) 20 MG tablet Take 20 mg by mouth at bedtime.    [provider]  meclizine  (ANTIVERT ) 12.5 MG tablet Take 12.5 mg by mouth 3 (three) times daily as needed for dizziness.    [provider]  Melatonin 5 MG CAPS Take by mouth.    [provider]  metoprolol  tartrate (LOPRESSOR ) 25 MG tablet Place 1 tablet (25 mg total) into feeding tube daily. Patient taking differently: Place 12.5 mg into feeding tube 2 (two) times daily. 03/07/21   Keene, Jeremiah D, NP  Multiple Vitamin (MULTIVITAMIN) LIQD Place 15 mLs into feeding tube daily. Patient not taking: Reported on 11/12/2023 03/07/21   Shellia Inge BIRCH, NP  Nutritional Supplements (FEEDING SUPPLEMENT, KATE FARMS STANDARD 1.4,) LIQD liquid Place 1,440 mLs into feeding tube daily. 03/07/21   Shellia Inge BIRCH, NP  pantoprazole  (PROTONIX ) 40 MG tablet Take 40 mg by mouth daily. 11/10/21   [provider]  polyethylene glycol (MIRALAX  / GLYCOLAX ) 17 g packet Place 17 g into feeding tube daily as needed for moderate constipation. 10/15/23   Dorinda Drue DASEN, MD  Propylene Glycol (SYSTANE BALANCE) 0.6 % SOLN Place 1 drop into both eyes 2 (two) times daily.    [provider]  rOPINIRole  (REQUIP ) 0.5 MG tablet Take 0.5 mg by mouth at bedtime. 10/27/21   [provider]  sodium chloride  HYPERTONIC 3 % nebulizer solution Take 4 mLs by nebulization 2 (two) times daily. 07/05/21   Shellia Inge BIRCH, NP  TOUJEO  SOLOSTAR 300 UNIT/ML Solostar Pen Inject 25 Units into the skin daily. 11/06/21   [provider]  Vibegron  (GEMTESA ) 75 MG TABS Take 1 tablet (75  mg total) by mouth daily. Patient not taking: Reported on 11/12/2023 05/24/22   Vaillancourt, Samantha, PA-C  Water  For Irrigation, Sterile (FREE WATER ) SOLN Place 50 mLs into feeding tube every 4 (four) hours. 03/06/21   Keene, Jeremiah D, NP  Zinc  Oxide (BAZA PROTECT MOISTURE BARRIER) 12 % CREA Apply 1 application twice a day by topical route. 08/08/21   [provider]    Physical  Exam: Vitals:   12/18/23 2011 12/18/23 2243 12/18/23 2353 12/19/23 0030  BP:  (!) 103/52  107/64  Pulse:  89  84  Resp:    12  Temp: 98.6 F (37 C)  98 F (36.7 C)   TempSrc: Oral  Oral   SpO2:    100%  Weight:      Height:       General: Has acute respiratory distress HEENT:       Eyes: PERRL, EOMI, no jaundice       ENT: No discharge from the ears and nose, no pharynx injection, no tonsillar enlargement.        Neck: No JVD, no bruit, no mass felt. Heme: No neck lymph node enlargement. Cardiac: S1/S2, RRR, No murmurs, No gallops or rubs. Respiratory: Has coarse sounds bilaterally GI: Soft, nondistended, nontender, no rebound pain, no organomegaly, BS present. GU: No hematuria Ext: has trace  leg edema bilaterally. 1+DP/PT pulse bilaterally. Musculoskeletal: No joint deformities, No joint redness or warmth, no limitation of ROM in spin. Skin: No rashes.  Neuro: Alert, oriented X3, cranial nerves II-XII grossly intact, moves all extremities normally. Psych: Patient is not psychotic, no suicidal or hemocidal ideation.  Labs on Admission: I have personally reviewed following labs and imaging studies  CBC: Recent Labs  Lab 12/18/23 1647  WBC 17.3*  HGB 14.9  HCT 45.9  MCV 86.4  PLT 224   Basic Metabolic Panel: Recent Labs  Lab 12/18/23 1647  NA 135  K 4.6  CL 98  CO2 25  GLUCOSE 286*  BUN 24*  CREATININE 0.89  CALCIUM  9.6   GFR: Estimated Creatinine Clearance: 51.7 mL/min (by C-G formula based on SCr of 0.89 mg/dL). Liver Function Tests: Recent Labs  Lab 12/18/23 1647   AST 90*  ALT 44  ALKPHOS 85  BILITOT 1.0  PROT 8.9*  ALBUMIN  3.6   No results for input(s): LIPASE, AMYLASE in the last 168 hours. No results for input(s): AMMONIA in the last 168 hours. Coagulation Profile: No results for input(s): INR, PROTIME in the last 168 hours. Cardiac Enzymes: No results for input(s): CKTOTAL, CKMB, CKMBINDEX, TROPONINI in the last 168 hours. BNP (last 3 results) No results for input(s): PROBNP in the last 8760 hours. HbA1C: No results for input(s): HGBA1C in the last 72 hours. CBG: Recent Labs  Lab 12/18/23 2108 12/19/23 0055  GLUCAP 314* 273*   Lipid Profile: No results for input(s): CHOL, HDL, LDLCALC, TRIG, CHOLHDL, LDLDIRECT in the last 72 hours. Thyroid Function Tests: No results for input(s): TSH, T4TOTAL, FREET4, T3FREE, THYROIDAB in the last 72 hours. Anemia Panel: No results for input(s): VITAMINB12, FOLATE, FERRITIN, TIBC, IRON, RETICCTPCT in the last 72 hours. Urine analysis:    Component Value Date/Time   COLORURINE YELLOW (A) 12/18/2023 2103   APPEARANCEUR CLOUDY (A) 12/18/2023 2103   APPEARANCEUR Cloudy (A) 06/25/2022 1528   LABSPEC 1.012 12/18/2023 2103   PHURINE 6.0 12/18/2023 2103   GLUCOSEU >=500 (A) 12/18/2023 2103   HGBUR NEGATIVE 12/18/2023 2103   BILIRUBINUR NEGATIVE 12/18/2023 2103   BILIRUBINUR Negative 06/25/2022 1528   KETONESUR NEGATIVE 12/18/2023 2103   PROTEINUR 100 (A) 12/18/2023 2103   UROBILINOGEN 2.0 (H) 11/18/2010 1535   NITRITE NEGATIVE 12/18/2023 2103   LEUKOCYTESUR LARGE (A) 12/18/2023 2103   Sepsis Labs: @LABRCNTIP (procalcitonin:4,lacticidven:4) ) Recent Results (from the past 240 hours)  Resp panel by RT-PCR (RSV, Flu A&B, Covid) Anterior Nasal Swab     Status: None   Collection Time: 12/18/23  5:15 PM   Specimen: Anterior Nasal Swab  Result Value Ref Range Status   SARS Coronavirus 2 by RT PCR NEGATIVE NEGATIVE Final    Comment:  (NOTE) SARS-CoV-2 target nucleic acids are NOT DETECTED.  The SARS-CoV-2 RNA is generally detectable in upper respiratory specimens during the acute phase of infection. The lowest concentration of SARS-CoV-2 viral copies this assay can detect is 138 copies/mL. A negative result does not preclude SARS-Cov-2 infection and should not be used as the sole basis for treatment or other patient management decisions. A negative result may occur with  improper specimen collection/handling, submission of specimen other than nasopharyngeal swab, presence of viral mutation(s) within the areas targeted by this assay, and inadequate number of viral copies(<138 copies/mL). A negative result must be combined with clinical observations, patient history, and epidemiological information. The expected result is Negative.  Fact Sheet for Patients:  BloggerCourse.com  Fact Sheet for Healthcare Providers:  SeriousBroker.it  This test is no t yet approved or cleared by the [place]  FDA and  has been authorized for detection and/or diagnosis of SARS-CoV-2 by FDA under an Emergency Use Authorization (EUA). This EUA will remain  in effect (meaning this test can be used) for the duration of the COVID-19 declaration under Section 564(b)(1) of the Act, 21 U.S.C.section 360bbb-3(b)(1), unless the authorization is terminated  or revoked sooner.       Influenza A by PCR NEGATIVE NEGATIVE Final   Influenza B by PCR NEGATIVE NEGATIVE Final    Comment: (NOTE) The Xpert Xpress SARS-CoV-2/FLU/RSV plus assay is intended as an aid in the diagnosis of influenza from Nasopharyngeal swab specimens and should not be used as a sole basis for treatment. Nasal washings and aspirates are unacceptable for Xpert Xpress SARS-CoV-2/FLU/RSV testing.  Fact Sheet for Patients: BloggerCourse.com  Fact Sheet for Healthcare  Providers: SeriousBroker.it  This test is not yet approved or cleared by the [place]  FDA and has been authorized for detection and/or diagnosis of SARS-CoV-2 by FDA under an Emergency Use Authorization (EUA). This EUA will remain in effect (meaning this test can be used) for the duration of the COVID-19 declaration under Section 564(b)(1) of the Act, 21 U.S.C. section 360bbb-3(b)(1), unless the authorization is terminated or revoked.     Resp Syncytial Virus by PCR NEGATIVE NEGATIVE Final    Comment: (NOTE) Fact Sheet for Patients: BloggerCourse.com  Fact Sheet for Healthcare Providers: SeriousBroker.it  This test is not yet approved or cleared by the [place]  FDA and has been authorized for detection and/or diagnosis of SARS-CoV-2 by FDA under an Emergency Use Authorization (EUA). This EUA will remain in effect (meaning this test can be used) for the duration of the COVID-19 declaration under Section 564(b)(1) of the Act, 21 U.S.C. section 360bbb-3(b)(1), unless the authorization is terminated or revoked.  Performed at [facility], [address], [place], [place] [address]      Radiological Exams on Admission:   Assessment/Plan Principal Problem:   Acute on chronic respiratory failure with hypoxia [facility]) Active Problems:   Pulmonary fibrosis (HCC)   Tracheostomy present (HCC)   Chronic diastolic CHF (congestive heart failure) (HCC)   Type II diabetes mellitus with renal manifestations (HCC)   Diabetic neuropathy (HCC)   Depression with anxiety   Overweight (BMI 25.0-29.9)   Assessment and Plan:   Acute on chronic respiratory failure with hypoxia: Etiology is not clear.  PCR negative for COVID, flu and RSV.  Chest x-ray showed interstitial prominence, but no clear  infiltrate.  Patient only has trace leg edema, does not seem to have CHF exacerbation.  Differential  diagnosis include pulmonary fibrosis flareup and early stage of aspiration pneumonia.  Patient has lactic acid 2.9, but no fever, which is likely due to hypoxia.  Does not seem to have sepsis.   - Will admit to SDU as inpt - IV Unasyn  (patient received 1 dose of vancomycin , cefepime  and azithromycin  in ED) - Solu-Medrol 125 mg was given by EMS --> followed by 80 mg daily - Mucinex  for cough  - Bronchodilators - Urine legionella and S. pneumococcal antigen - Follow up blood culture x2, sputum culture - trend lactic acid level per sepsis protocol - IVF: 500 cc NS x 2 in ED -->will not give more IVF due to CHF  Pulmonary fibrosis (HCC) -see above  Tracheostomy present (HCC) -Ventilator at night - RT is on board  Chronic diastolic CHF (congestive heart failure) (HCC): 2D echo on 10/09/2023 showed EF of 60 to 65%.  Patient has trace leg edema, no JVD.  Does not seem to have CHF exacerbation. -Check BNP  Type II diabetes mellitus with renal manifestations [facility]): Recent A1c 7.6, poorly controlled.  Patient taking Humalog and Toujeo  25 units daily -Glargine insulin  20 units daily - SSI  Diabetic neuropathy (HCC) -Gabapentin   Depression with anxiety -Continue home medications  Overweight (BMI 25.0-29.9): Body weight 77.1 kg, BMI 28.29 - Encourage losing weight - Exercise healthy diet  S/p of PEG: - Tube feeding is ordered     DVT ppx: SQ Lovenox   Code Status: Full code   Family Communication:     not done, no family member is at bed side.      Disposition Plan:  Anticipate discharge back to previous environment  Consults called:  none  Admission status and Level of care: Stepdown:  as inpt        Dispo: The patient is from: SNF              Anticipated d/c is to: SNF              Anticipated d/c date is: 2 days              Patient currently is not medically stable to d/c.    Severity of Illness:  The appropriate patient status for this patient is INPATIENT.  Inpatient status is judged to be reasonable and necessary in order to provide the required intensity of service to ensure the patient's safety. The patient's presenting symptoms, physical exam findings, and initial radiographic and laboratory data in the context of their chronic comorbidities is felt to place them at high risk for further clinical deterioration. Furthermore, it is not anticipated that the patient will be medically stable for discharge from the hospital within 2 midnights of admission.   * I certify that at the point of admission it is my clinical judgment that the patient will require inpatient hospital care spanning beyond 2 midnights from the point of admission due to high intensity of service, high risk for further deterioration and high frequency of surveillance required.*       Date of Service 12/19/2023    [facility]   If 7PM-7AM, please contact night-coverage [email or web address] 12/19/2023, 1:57 AM

## 2023-12-18 NOTE — ED Notes (Signed)
 Unsuccessful attempt to collect 2nd set cultures by 2 RN.Lab contacted to collect 2nd set cultures. Will not delay antibiotic

## 2023-12-18 NOTE — ED Triage Notes (Addendum)
 Pt to ED [facility] from home. Respiratory distress, pt has trach. ST with EMS. Normally does not have supplemental O2. Ems reports cyanotic on arrival, SpO2 50s. 125 solumedrol given PTA Pt has g tube in place

## 2023-12-18 NOTE — ED Notes (Signed)
 This RN called lab, verified 2 sets of blood cultures collected.

## 2023-12-19 DIAGNOSIS — J9621 Acute and chronic respiratory failure with hypoxia: Secondary | ICD-10-CM | POA: Diagnosis not present

## 2023-12-19 LAB — CBC
HCT: 38.7 % (ref 36.0–46.0)
Hemoglobin: 12.7 g/dL (ref 12.0–15.0)
MCH: 28.5 pg (ref 26.0–34.0)
MCHC: 32.8 g/dL (ref 30.0–36.0)
MCV: 86.8 fL (ref 80.0–100.0)
Platelets: 164 K/uL (ref 150–400)
RBC: 4.46 MIL/uL (ref 3.87–5.11)
RDW: 12.8 % (ref 11.5–15.5)
WBC: 9.4 K/uL (ref 4.0–10.5)
nRBC: 0 % (ref 0.0–0.2)

## 2023-12-19 LAB — GLUCOSE, CAPILLARY
Glucose-Capillary: 327 mg/dL — ABNORMAL HIGH (ref 70–99)
Glucose-Capillary: 320 mg/dL — ABNORMAL HIGH (ref 70–99)

## 2023-12-19 LAB — BASIC METABOLIC PANEL WITH GFR
Anion gap: 12 (ref 5–15)
BUN: 26 mg/dL — ABNORMAL HIGH (ref 8–23)
CO2: 22 mmol/L (ref 22–32)
Calcium: 8.8 mg/dL — ABNORMAL LOW (ref 8.9–10.3)
Chloride: 100 mmol/L (ref 98–111)
Creatinine, Ser: 1.09 mg/dL — ABNORMAL HIGH (ref 0.44–1.00)
GFR, Estimated: 51 mL/min — ABNORMAL LOW (ref >60)
Glucose, Bld: 359 mg/dL — ABNORMAL HIGH (ref 70–99)
Potassium: 4.8 mmol/L (ref 3.5–5.1)
Sodium: 134 mmol/L — ABNORMAL LOW (ref 135–145)

## 2023-12-19 LAB — STREP PNEUMONIAE URINARY ANTIGEN: Strep Pneumo Urinary Antigen: NEGATIVE

## 2023-12-19 LAB — CBG MONITORING, ED
Glucose-Capillary: 228 mg/dL — ABNORMAL HIGH (ref 70–99)
Glucose-Capillary: 273 mg/dL — ABNORMAL HIGH (ref 70–99)
Glucose-Capillary: 285 mg/dL — ABNORMAL HIGH (ref 70–99)
Glucose-Capillary: 333 mg/dL — ABNORMAL HIGH (ref 70–99)
Glucose-Capillary: 348 mg/dL — ABNORMAL HIGH (ref 70–99)

## 2023-12-19 LAB — BRAIN NATRIURETIC PEPTIDE: B Natriuretic Peptide: 1372.2 pg/mL — ABNORMAL HIGH (ref 0.0–100.0)

## 2023-12-19 MED ORDER — FREE WATER
100.0000 mL | Freq: Four times a day (QID) | Status: DC
  Administered 2023-12-19 – 2023-12-23 (×14): 100 mL
  Filled 2023-12-19 (×4): qty 100

## 2023-12-19 MED ORDER — METHYLPREDNISOLONE SODIUM SUCC 125 MG IJ SOLR
80.0000 mg | Freq: Every day | INTRAMUSCULAR | Status: DC
  Administered 2023-12-19 – 2023-12-23 (×5): 80 mg via INTRAVENOUS
  Filled 2023-12-19 (×6): qty 2

## 2023-12-19 MED ORDER — KATE FARMS STANDARD 1.4 PO LIQD
325.0000 mL | Freq: Four times a day (QID) | ORAL | Status: DC
  Administered 2023-12-19 – 2023-12-23 (×10): 325 mL
  Filled 2023-12-19: qty 325

## 2023-12-19 MED ORDER — INSULIN ASPART 100 UNIT/ML IJ SOLN
0.0000 [IU] | INTRAMUSCULAR | Status: DC
  Administered 2023-12-19: 7 [IU] via SUBCUTANEOUS
  Administered 2023-12-19: 5 [IU] via SUBCUTANEOUS
  Administered 2023-12-19 (×2): 7 [IU] via SUBCUTANEOUS
  Administered 2023-12-19 – 2023-12-20 (×2): 3 [IU] via SUBCUTANEOUS
  Administered 2023-12-20: 5 [IU] via SUBCUTANEOUS
  Administered 2023-12-20: 1 [IU] via SUBCUTANEOUS
  Administered 2023-12-21 (×2): 3 [IU] via SUBCUTANEOUS
  Administered 2023-12-21: 5 [IU] via SUBCUTANEOUS
  Filled 2023-12-19 (×6): qty 1
  Filled 2023-12-19: qty 7
  Filled 2023-12-19 (×5): qty 1
  Filled 2023-12-19: qty 7
  Filled 2023-12-19: qty 3

## 2023-12-19 MED ORDER — MUPIROCIN CALCIUM 2 % EX CREA
TOPICAL_CREAM | Freq: Two times a day (BID) | CUTANEOUS | Status: DC
  Administered 2023-12-21: 1 via TOPICAL
  Filled 2023-12-19 (×2): qty 15

## 2023-12-19 MED ORDER — FUROSEMIDE 10 MG/ML IJ SOLN
40.0000 mg | Freq: Two times a day (BID) | INTRAMUSCULAR | Status: DC
  Administered 2023-12-19 – 2023-12-21 (×5): 40 mg via INTRAVENOUS
  Filled 2023-12-19 (×5): qty 4

## 2023-12-19 MED ORDER — AMPICILLIN-SULBACTAM SODIUM 3 (2-1) G IJ SOLR
3.0000 g | Freq: Four times a day (QID) | INTRAMUSCULAR | Status: DC
  Administered 2023-12-19 – 2023-12-23 (×18): 3 g via INTRAVENOUS
  Filled 2023-12-19 (×24): qty 8

## 2023-12-19 NOTE — Progress Notes (Signed)
 Pharmacy Antibiotic Note  Angela David is a 83 y.o. female admitted on 12/18/2023 with aspiration PNA.  Pharmacy has been consulted for Unasyn  dosing.  Plan: Unasyn  3 gm IV Q6H ordered to start on 10/23 @ 0200.   Height: 5' 5 (165.1 cm) Weight: 77.1 kg (170 lb) IBW/kg (Calculated) : 57  Temp (24hrs), Avg:98.4 F (36.9 C), Min:98 F (36.7 C), Max:98.7 F (37.1 C)  Recent Labs  Lab 12/18/23 1647 12/18/23 1801 12/18/23 2040  WBC 17.3*  --   --   CREATININE 0.89  --   --   LATICACIDVEN  --  2.9* 2.8*    Estimated Creatinine Clearance: 51.7 mL/min (by C-G formula based on SCr of 0.89 mg/dL).    Allergies  Allergen Reactions   Shellfish Allergy Shortness Of Breath    respitatory distress    Sodium Sulfite Shortness Of Breath    Severe Congestion that creates inability to breath   Sulfa Antibiotics Shortness Of Breath and Other (See Comments)    respitatory distress   Codeine Other (See Comments)    Per patient it kept her up all night   Glucerna [Alimentum] Itching   Wound Dressing Adhesive Rash    Antimicrobials this admission:   >>    >>   Dose adjustments this admission:   Microbiology results:  BCx:  UCx:    Sputum:    MRSA PCR:   Thank you for allowing pharmacy to be a part of this patient's care.  Jermond Burkemper D 12/19/2023 1:54 AM

## 2023-12-19 NOTE — Progress Notes (Signed)
       CROSS COVER NOTE  NAME: Angela David MRN: [id number] DOB : 1940/07/25    Concern as stated by nurse / staff   pt that was admitted right before change of shift for respiratory failure. 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 The PEG tube site looks infected and there is large amounts of leaking around the tube. Literally most of the water  and feed is leaking out. She probably needs some imaging of her abdomen and GI consult. Thank you      Pertinent findings on chart review:   Patient Assessment        Assessment and  Interventions   Assessment:  Peg site skin infection with leakage  Plan: Hold PEG feeds for the night Bactoban ointment to site and local wound care IR consult for the am--hold lovenox  Local barrier precautions Consider GI consult X X

## 2023-12-19 NOTE — ED Notes (Signed)
 Fall risk bundle in place.

## 2023-12-19 NOTE — Progress Notes (Signed)
 Progress Note   Patient: Angela David FMW:[id number] DOB: [birth year] DOA: 12/18/2023     1 DOS: the patient was seen and examined on 12/19/2023   Brief hospital course: Angela David is a 83 y.o. 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    Assessment and Plan:   Acute on chronic respiratory failure with hypoxia: Etiology is not clear.  PCR negative for COVID, flu and RSV.  Chest x-ray showed interstitial prominence, but no clear infiltrate.   Patient being admitted to the progressive care unit Continue current antibiotic therapy Continue IV Solu-Medrol Added IV Lasix  given edema seen on x-ray Continue Mucinex  for cough Follow-up urine legionella and S. pneumococcal antigen - Follow up blood culture x2, sputum culture    Pulmonary fibrosis (HCC) Continue as needed nebulization   Tracheostomy present (HCC) RT on board for trach care   Chronic diastolic CHF (congestive heart failure) (HCC): 2D echo on 10/09/2023 showed EF of 60 to 65%.  Patient has trace leg edema, no JVD.  Does not seem to have CHF exacerbation. BNP elevated IV Lasix  added   Type II diabetes mellitus with renal manifestations [facility]): Recent A1c 7.6, poorly controlled.  Patient taking Humalog and Toujeo  25 units daily -Glargine insulin  20 units daily Continue SSI   Diabetic neuropathy (HCC) - Continue gabapentin    Depression with anxiety -Continue home medications   Overweight (BMI 25.0-29.9): Body weight 77.1 kg, BMI 28.29 - Encourage losing weight - Exercise healthy diet   S/p of PEG: Continue tube feeding   DVT ppx: SQ  Lovenox    Code Status: Full code    Family Communication:     not done, no family member is at bed side.       Disposition Plan:  Anticipate discharge back to previous environment   Consults called:  none   Subjective:  Patient seen and examined at bedside this morning Denies nausea vomiting abdominal pain chest pain cough Currently on 10 L of oxygen via trach collar  Physical Exam:  General: Has acute respiratory distress Heme: No neck lymph node enlargement. Cardiac: S1/S2, RRR, No murmurs, No gallops or rubs. Respiratory: Has coarse sounds bilaterally GI: Soft, nondistended, nontender GU: No hematuria Ext: has trace  leg edema bilaterally.  Musculoskeletal: No joint deformities, No joint redness or warmth, no limitation of ROM in spin. Skin: No rashes.  Neuro: Alert, oriented X3 Psych: Patient is not psychotic, no suicidal or hemocidal ideation.   Vitals:   12/19/23 1341 12/19/23 1400 12/19/23 1430 12/19/23 1445  BP: 101/70 104/65    Pulse: 79 79 80 79  Resp: 15 15 19 15   Temp: 98.5 F (36.9 C)     TempSrc:      SpO2: 100% 100% 100% 100%  Weight:      Height:        Data Reviewed: X-ray review showing findings of opacification concerning for infiltrate versus edema    Latest Ref Rng & Units 12/19/2023    4:35 AM 12/18/2023    4:47 PM 10/15/2023    5:36 AM  CBC  WBC 4.0 - 10.5 K/uL 9.4  17.3  10.0   Hemoglobin 12.0 - 15.0 g/dL 87.2  85.0  87.1   Hematocrit  36.0 - 46.0 % 38.7  45.9  39.0   Platelets 150 - 400 K/uL 164  224  239        Latest Ref Rng & Units 12/19/2023    4:35 AM 12/18/2023    4:47 PM 10/15/2023    5:36 AM  BMP  Glucose 70 - 99 mg/dL 640  713  830   BUN 8 - 23 mg/dL 26  24  32   Creatinine 0.44 - 1.00 mg/dL 8.90  9.10  9.00   Sodium 135 - 145 mmol/L 134  135  138   Potassium 3.5 - 5.1 mmol/L 4.8  4.6  4.2   Chloride 98 - 111 mmol/L 100  98  103   CO2 22 - 32 mmol/L 22  25  21    Calcium  8.9 - 10.3 mg/dL 8.8  9.6  9.5      Family  Communication: No family present at bedside  Disposition: Pending clinical course Status is: Inpatient  Time spent: 51 minutes  Author: Drue ONEIDA Potter, MD 12/19/2023 3:29 PM  For on call review [email or web address].

## 2023-12-19 NOTE — ED Notes (Signed)
 Pt suctioned per pt request.

## 2023-12-19 NOTE — ED Notes (Signed)
 Called CCMD for transfer of central monitoring

## 2023-12-20 ENCOUNTER — Inpatient Hospital Stay

## 2023-12-20 DIAGNOSIS — J9621 Acute and chronic respiratory failure with hypoxia: Secondary | ICD-10-CM | POA: Diagnosis not present

## 2023-12-20 LAB — BASIC METABOLIC PANEL WITH GFR
Anion gap: 15 (ref 5–15)
BUN: 31 mg/dL — ABNORMAL HIGH (ref 8–23)
CO2: 25 mmol/L (ref 22–32)
Calcium: 9.3 mg/dL (ref 8.9–10.3)
Chloride: 98 mmol/L (ref 98–111)
Creatinine, Ser: 0.83 mg/dL (ref 0.44–1.00)
GFR, Estimated: >60 mL/min (ref >60)
Glucose, Bld: 139 mg/dL — ABNORMAL HIGH (ref 70–99)
Potassium: 4.1 mmol/L (ref 3.5–5.1)
Sodium: 138 mmol/L (ref 135–145)

## 2023-12-20 LAB — GLUCOSE, CAPILLARY
Glucose-Capillary: 192 mg/dL — ABNORMAL HIGH (ref 70–99)
Glucose-Capillary: 137 mg/dL — ABNORMAL HIGH (ref 70–99)
Glucose-Capillary: 151 mg/dL — ABNORMAL HIGH (ref 70–99)
Glucose-Capillary: 276 mg/dL — ABNORMAL HIGH (ref 70–99)
Glucose-Capillary: 224 mg/dL — ABNORMAL HIGH (ref 70–99)

## 2023-12-20 LAB — LEGIONELLA PNEUMOPHILA SEROGP 1 UR AG: L. pneumophila Serogp 1 Ur Ag: NEGATIVE

## 2023-12-20 MED ORDER — IPRATROPIUM-ALBUTEROL 0.5-2.5 (3) MG/3ML IN SOLN
3.0000 mL | Freq: Four times a day (QID) | RESPIRATORY_TRACT | Status: DC
  Administered 2023-12-21: 3 mL via RESPIRATORY_TRACT
  Filled 2023-12-20: qty 3

## 2023-12-20 NOTE — Progress Notes (Signed)
 Progress Note   Patient: Angela David FMW:[id number] DOB: 1940/04/04 DOA: 12/18/2023     2 DOS: the patient was seen and examined on 12/20/2023   Brief hospital course: Angela David is a 83 y.o. 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     Assessment and Plan:   Acute on chronic respiratory failure with hypoxia: Etiology is not clear.  PCR negative for COVID, flu and RSV.  Chest x-ray showed interstitial prominence, but no clear infiltrate.   Patient being admitted to the progressive care unit Continue current antibiotic therapy Continue IV Solu-Medrol Added IV Lasix  given edema seen on x-ray Continue Mucinex  for cough Follow-up urine legionella and S. pneumococcal antigen - Follow up blood culture x2, sputum culture     Pulmonary fibrosis (HCC) Continue as needed nebulization   Tracheostomy present (HCC) RT on board for trach care   Chronic diastolic CHF (congestive heart failure) (HCC): 2D echo on 10/09/2023 showed EF of 60 to 65%.  Patient has trace leg edema, no JVD.  Does not seem to have CHF exacerbation. BNP elevated IV Lasix  added   Type II diabetes mellitus with renal manifestations [facility]): Recent A1c 7.6, poorly controlled.  Patient taking Humalog and Toujeo  25 units daily -Glargine insulin  20 units daily Continue SSI   Diabetic neuropathy (HCC) - Continue gabapentin    Depression with anxiety -Continue home medications   Overweight (BMI 25.0-29.9): Body weight 77.1 kg, BMI 28.29 - Encourage losing weight - Exercise healthy diet   S/p of PEG: IR team recommends continuation of  tube feeding Patient is not ready for tube manipulation at this time and still thinking about definitive plan in the setting of the leak until Monday to finalize her decision.   DVT ppx: SQ Lovenox    Code Status: Full code    Family Communication:     not done, no family member is at bed side.       Disposition Plan:  Anticipate discharge back to previous environment   Consults called:  none     Subjective:  Patient seen and examined at bedside this morning Denies nausea vomiting abdominal pain chest pain cough Currently on 10 L of oxygen via trach collar   Physical Exam:   General: Has acute respiratory distress Heme: No neck lymph node enlargement. Cardiac: S1/S2, RRR, No murmurs, No gallops or rubs. Respiratory: Has coarse sounds bilaterally GI: Soft, nondistended, nontender GU: No hematuria Ext: has trace  leg edema bilaterally.  Musculoskeletal: No joint deformities, No joint redness or warmth, no limitation of ROM in spin. Skin: No rashes.  Neuro: Alert, oriented X3 Psych: Patient is not psychotic, no suicidal or hemocidal ideation.   Data Reviewed: X-ray review showing findings of opacification concerning for infiltrate versus edema    Latest Ref Rng & Units 12/19/2023    4:35 AM 12/18/2023    4:47 PM 10/15/2023    5:36 AM  CBC  WBC 4.0 - 10.5 K/uL 9.4  17.3  10.0   Hemoglobin 12.0 - 15.0 g/dL 87.2  85.0  87.1   Hematocrit 36.0 - 46.0 % 38.7  45.9  39.0   Platelets 150 - 400 K/uL 164  224  239  Latest Ref Rng & Units 12/20/2023   11:01 AM 12/19/2023    4:35 AM 12/18/2023    4:47 PM  BMP  Glucose 70 - 99 mg/dL 860  640  713   BUN 8 - 23 mg/dL 31  26  24    Creatinine 0.44 - 1.00 mg/dL 9.16  8.90  9.10   Sodium 135 - 145 mmol/L 138  134  135   Potassium 3.5 - 5.1 mmol/L 4.1  4.8  4.6   Chloride 98 - 111 mmol/L 98  100  98   CO2 22 - 32 mmol/L 25  22  25    Calcium  8.9 - 10.3 mg/dL 9.3  8.8  9.6     Vitals:   12/20/23 1301 12/20/23 1400 12/20/23  1500 12/20/23 1600  BP:  (!) 132/47 118/70 (!) 83/70  Pulse: 94 (!) 103 100 (!) 106  Resp: 18 (!) 21 17 18   Temp:      TempSrc:      SpO2: 100% 99% 100% 100%  Weight:      Height:        Author: Drue ONEIDA Potter, MD 12/20/2023 5:12 PM  For on call review [email or web address].

## 2023-12-20 NOTE — Consult Note (Signed)
 [name] Pulmonary Medicine Consultation      Date: 12/20/2023,   MRN# [id number] Angela David 05/02/1940  SYNOPSIS Acute on chronic hypercarbic and hypoxic respiratory failure Chronic dependency on tracheostomy VOCAL CORD DYSFUNCTION H/o +MRSA pneumonia Esophageal dilatation/dysmotility by CT study Chronic dependency on tracheostomy-can eat without capping History of tracheal stenosis History of vocal cord dysfunction    CHIEF COMPLAINT:   Increased SOB   HISTORY OF PRESENT ILLNESS   83 y.o. 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   Patient states that her shortness of breath has been progressively worsening since this morning.  She has dry cough, no chest pain, fever or chills.  She is normally not using oxygen during the daytime, but was found to have acute respiratory distress, oxygen desaturation to 50% on room air, requiring nonrebreather.  Patient denies nausea, vomiting, diarrhea or abdominal pain.  No symptoms of UTI.  VIRAL PANEL NEG TREATMENT PLAN IN ER  IV Unasyn  (patient received 1 dose of vancomycin , cefepime  and azithromycin  in ED) - Solu-Medrol 125 mg was given by EMS --> followed by 80 mg daily - Mucinex  for cough  - Bronchodilators  Patient does use home ventilator Patient alert and awake, following commands feeling much better  I am NOT sure why patient was transferred to [place], patient is NOT in distress Alert and awake PAST MEDICAL HISTORY   Past Medical History:  Diagnosis Date   Breast cancer, right (HCC) 2002   Tx'd with chemotherapy and XRT   Chronic respiratory  failure (HCC)    s/p tracheostomy in 2012; on ventilatory support at hs   Diabetic peripheral neuropathy (HCC) 08/28/2016   GERD (gastroesophageal reflux disease)    Glaucoma    High cholesterol    History of colon polyps    Osteoarthritis    Personal history of chemotherapy 2002   BREAST CA   Personal history of radiation therapy 2002   BREAST CA   S/P percutaneous endoscopic gastrostomy (PEG) tube placement (HCC)    T2DM (type 2 diabetes mellitus) (HCC)    Tracheostomy dependent (HCC) 2012   Ventral hernia    Vocal cord paralysis 2012     SURGICAL HISTORY   Past Surgical History:  Procedure Laterality Date   ABDOMINAL HYSTERECTOMY     BREAST BIOPSY Right 2008   neg   BREAST BIOPSY Right 06/30/2020   Q clip-path pending   BREAST EXCISIONAL BIOPSY Right 2002   positive   BREAST LUMPECTOMY Right 2002   chemo and radiation   BREAST LUMPECTOMY WITH RADIOFREQUENCY TAG IDENTIFICATION Right 10/18/2020   Procedure: BREAST LUMPECTOMY WITH RADIOFREQUENCY TAG IDENTIFICATION;  Surgeon: Jordis Laneta FALCON, MD;  Location: [facility] ORS;  Service: General;  Laterality: Right;   BREAST SURGERY Right 2002   LUMPECTOMY, radiation, chemotherapy   CATARACT EXTRACTION, BILATERAL     CHOLECYSTECTOMY N/A 12/10/2017   Procedure: LAPAROSCOPIC CHOLECYSTECTOMY;  Surgeon: Tye Millet, DO;  Location: [facility] ORS;  Service: General;  Laterality: N/A;   COLON RESECTION Right 11/06/2018   Procedure: HAND ASSISTED LAPAROSCOPIC COLON RESECTION, RIGHT;  Surgeon: Jordis Laneta FALCON, MD;  Location: [facility] OR[facility];  Service: General;  Laterality: Right;   COLONOSCOPY  2009,12/30/2013   COLONOSCOPY WITH PROPOFOL  N/A 04/16/2018  Tubulovillous polyp proximal transverse colon, large sessile polyp, proximal transverse colon;  Surgeon: Dessa Reyes ORN, MD;  Location: [facility];  Service: Endoscopy;  Laterality: N/A;   ESOPHAGOGASTRODUODENOSCOPY (EGD) WITH PROPOFOL  N/A 10/14/2018   Procedure: ESOPHAGOGASTRODUODENOSCOPY (EGD)  WITH PROPOFOL ;  Surgeon: Jinny Carmine, MD;  Location: [facility];  Service: Endoscopy;  Laterality: N/A;   IR GASTR TUBE CONVERT GASTR-JEJ PER W/FL MOD SED  01/10/2022   [name] TUBE CHANGE  07/30/2022   IR REPLACE G-TUBE SIMPLE WO FLUORO  11/29/2021   IR REPLACE G-TUBE SIMPLE WO FLUORO  10/10/2023   IR REPLACE G-TUBE SIMPLE WO FLUORO  10/15/2023   IR REPLC GASTRO/COLONIC TUBE PERCUT W/FLUORO  11/24/2021   IR REPLC GASTRO/COLONIC TUBE PERCUT W/FLUORO  05/02/2023   TRACHEAL SURGERY  2013   TRACHEOSTOMY N/A    Dr Edda   XI ROBOTIC ASSISTED VENTRAL HERNIA N/A 10/18/2020   Procedure: XI ROBOTIC ASSISTED VENTRAL HERNIA;  Surgeon: Jordis Laneta FALCON, MD;  Location: [facility];  Service: General;  Laterality: N/A;     FAMILY HISTORY   Family History  Problem Relation Age of Onset   Cancer Father        bone cancer   Cancer Sister        lymphoma   Cancer Brother    Breast cancer Neg Hx      SOCIAL HISTORY   Social History   Tobacco Use   Smoking status: Never    Passive exposure: Past   Smokeless tobacco: Never  Vaping Use   Vaping status: Never Used  Substance Use Topics   Alcohol  use: No   Drug use: No     MEDICATIONS    Home Medication:    Current Medication:  Current Facility-Administered Medications:    acetaminophen  (TYLENOL ) tablet 325 mg, 325 mg, Oral, Q6H PRN, Niu, Xilin, MD   albuterol  (PROVENTIL ) (2.5 MG/3ML) 0.083% nebulizer solution 2.5 mg, 2.5 mg, Nebulization, Q4H PRN, Niu, Xilin, MD   Ampicillin -Sulbactam (UNASYN ) 3 g in sodium chloride  0.9 % 100 mL IVPB, 3 g, Intravenous, Q6H, Niu, Xilin, MD, Last Rate: 200 mL/hr at 12/20/23 0836, 3 g at 12/20/23 0836   artificial tears ophthalmic solution 1 drop, 1 drop, Both Eyes, BID, Niu, Xilin, MD, 1 drop at 12/20/23 9147   buPROPion  (WELLBUTRIN  SR) 12 hr tablet 150 mg, 150 mg, Oral, BID, Niu, Xilin, MD, 150 mg at 12/19/23 2230   enoxaparin  (LOVENOX ) injection 40 mg, 40 mg, Subcutaneous, Q24H, Cleatus Hoof V, MD, 40 mg  at 12/19/23 2201   escitalopram  (LEXAPRO ) tablet 20 mg, 20 mg, Oral, Daily, Niu, Xilin, MD, 20 mg at 12/19/23 1020   ezetimibe  (ZETIA ) tablet 10 mg, 10 mg, Per Tube, Daily, Niu, Xilin, MD, 10 mg at 12/19/23 1020   feeding supplement (KATE FARMS STANDARD 1.4) liquid 325 mL, 325 mL, Per Tube, QID, Djan, Prince T, MD, 325 mL at 12/19/23 2202   folic acid  (FOLVITE ) tablet 1 mg, 1 mg, Oral, Daily, Niu, Xilin, MD, 1 mg at 12/19/23 1020   free water  100 mL, 100 mL, Per Tube, QID, Dorinda Homans T, MD, 100 mL at 12/19/23 2207   free water  50 mL, 50 mL, Per Tube, Q4H, Niu, Xilin, MD, 50 mL at 12/19/23 2000   furosemide  (LASIX ) injection 40 mg, 40 mg, Intravenous, BID, Djan, Prince T, MD, 40 mg at 12/20/23 9163   gabapentin  (NEURONTIN ) capsule 400 mg, 400 mg, Per Tube, TID, Niu, Xilin, MD, 400 mg at 12/19/23 2201   guaiFENesin  (  ROBITUSSIN) 100 MG/5ML liquid 200 mg, 200 mg, Oral, Q4H PRN, Niu, Xilin, MD   insulin  aspart (novoLOG ) injection 0-9 Units, 0-9 Units, Subcutaneous, Q4H, Duncan, Hazel V, MD, 1 Units at 12/20/23 1007   insulin  glargine-yfgn (SEMGLEE ) injection 20 Units, 20 Units, Subcutaneous, Daily, Niu, Xilin, MD, 20 Units at 12/19/23 1021   ipratropium-albuterol  (DUONEB) 0.5-2.5 (3) MG/3ML nebulizer solution 3 mL, 3 mL, Nebulization, Q4H, Niu, Xilin, MD, 3 mL at 12/20/23 9192   melatonin tablet 5 mg, 5 mg, Per Tube, QHS PRN, Niu, Xilin, MD, 5 mg at 12/18/23 2242   methylPREDNISolone sodium succinate (SOLU-MEDROL) 125 mg/2 mL injection 80 mg, 80 mg, Intravenous, Daily, Niu, Xilin, MD, 80 mg at 12/20/23 9148   metoprolol  tartrate (LOPRESSOR ) tablet 12.5 mg, 12.5 mg, Per Tube, BID, Niu, Xilin, MD, 12.5 mg at 12/19/23 2201   multivitamin with minerals tablet 1 tablet, 1 tablet, Per Tube, Daily, Niu, Xilin, MD, 1 tablet at 12/19/23 1020   mupirocin  cream (BACTROBAN ) 2 %, , Topical, BID, Cleatus Delayne GAILS, MD, Given at 12/20/23 [phone]   ondansetron  (ZOFRAN ) injection 4 mg, 4 mg, Intravenous, Q8H PRN, Niu,  Xilin, MD   pantoprazole  (PROTONIX ) EC tablet 40 mg, 40 mg, Oral, Daily, Niu, Xilin, MD, 40 mg at 12/19/23 1019   polyethylene glycol (MIRALAX  / GLYCOLAX ) packet 17 g, 17 g, Per Tube, Daily PRN, Niu, Xilin, MD   rOPINIRole  (REQUIP ) tablet 0.5 mg, 0.5 mg, Per Tube, QHS, Niu, Xilin, MD, 0.5 mg at 12/19/23 2201    ALLERGIES   Shellfish allergy, Sodium sulfite, Sulfa antibiotics, Codeine, Glucerna [alimentum], and Wound dressing adhesive   BP (!) 117/52 (BP Location: Left Arm)   Pulse 93   Temp (!) 97.4 F (36.3 C)   Resp 20   Ht 5' 5 (1.651 m)   Wt 74.3 kg   SpO2 100%   BMI 27.26 kg/m    Review of Systems: +sputum production, +shortness of breath Other:  All other systems negative   Physical Examination:   General Appearance: No distress  S/p trach minimal oxygen EYES PERRLA, EOM intact.   NECK Supple, No JVD Pulmonary: normal breath sounds, No wheezing.  CardiovascularNormal S1,S2.  No m/r/g.   Abdomen: Benign, Soft, non-tender. Neurology UE/LE 5/5 strength, no focal deficits Ext pulses intact, cap refill intact ALL OTHER ROS ARE NEGATIVE      IMAGING    DG Abd 1 View Result Date: 12/20/2023 CLINICAL DATA:  83 year old female with abdominal pain. EXAM: ABDOMEN - 1 VIEW COMPARISON:  Abdominal radiographs 10/10/2023 and earlier. FINDINGS: Two supine views at 0845 hours. Percutaneous gastrostomy tube remains in place. Nonobstructed bowel-gas pattern. Confluent right lung base opacity persists, but appears related to elevated right hemidiaphragm on CT Abdomen and Pelvis in 2023. Chronic staple line of bowel in the right abdomen. No acute finding. IMPRESSION: Nonobstructed bowel-gas pattern. Percutaneous gastrostomy tube in place. Electronically Signed   By: VEAR Hurst M.D.   On: 12/20/2023 09:17   DG Chest Port 1 View Result Date: 12/18/2023 EXAM: 1 VIEW(S) XRAY OF THE CHEST 12/18/2023 05:22:56 PM COMPARISON: 10/09/2023 CLINICAL HISTORY: Shortness of breath 10026. SOB  FINDINGS: LINES, TUBES AND DEVICES: Tracheostomy tube in place. Metallic device in left hilum. Surgical clips in right axilla. LUNGS AND PLEURA: Elevated right hemidiaphragm. Small bilateral pleural effusion. Persistent bilateral interstitial prominence, similar to prior exam. No pneumothorax. HEART AND MEDIASTINUM: No acute abnormality of the cardiac and mediastinal silhouettes. BONES AND SOFT TISSUES: No acute osseous abnormality. IMPRESSION: 1. Persistent bilateral  interstitial prominence, similar to prior exam and favoring edema. 2. Small bilateral pleural effusions. Electronically signed by: Norman Gatlin MD 12/18/2023 05:26 PM EDT RP Workstation: [id number]      ASSESSMENT/PLAN     Acute on chronic respiratory failure with hypoxia: likely due to recurrent aspiration pneumonia and resp muscle fatigue poor resp effort. PCR negative for COVID, flu and RSV.  Chest x-ray showed interstitial prominence, but no clear infiltrate.  Patient only has trace leg edema, does not seem to have CHF exacerbation.  Continue oxygen Continue Home vent(I asked husband to bring home vent) NEB THERAPY AS NEEDED  INFECTIOUS DISEASE -continue antibiotics as prescribed -follow up cultures Antibiotics Given (last 72 hours)     Date/Time Action Medication Dose Rate   12/18/23 1733 New Bag/Given   ceFEPIme  (MAXIPIME ) 2 g in sodium chloride  0.9 % 100 mL IVPB 2 g 200 mL/hr   12/18/23 1821 New Bag/Given   azithromycin  (ZITHROMAX ) 500 mg in sodium chloride  0.9 % 250 mL IVPB 500 mg 250 mL/hr   12/18/23 1944 New Bag/Given   vancomycin  (VANCOCIN ) IVPB 1000 mg/200 mL premix 1,000 mg 200 mL/hr   12/19/23 0205 New Bag/Given   Ampicillin -Sulbactam (UNASYN ) 3 g in sodium chloride  0.9 % 100 mL IVPB 3 g 200 mL/hr   12/19/23 0758 New Bag/Given   Ampicillin -Sulbactam (UNASYN ) 3 g in sodium chloride  0.9 % 100 mL IVPB 3 g 200 mL/hr   12/19/23 1322 New Bag/Given   Ampicillin -Sulbactam (UNASYN ) 3 g in sodium chloride  0.9 %  100 mL IVPB 3 g 200 mL/hr   12/19/23 2220 New Bag/Given   Ampicillin -Sulbactam (UNASYN ) 3 g in sodium chloride  0.9 % 100 mL IVPB 3 g 200 mL/hr   12/20/23 0322 New Bag/Given   Ampicillin -Sulbactam (UNASYN ) 3 g in sodium chloride  0.9 % 100 mL IVPB 3 g 200 mL/hr   12/20/23 0836 New Bag/Given   Ampicillin -Sulbactam (UNASYN ) 3 g in sodium chloride  0.9 % 100 mL IVPB 3 g 200 mL/hr         I spent a total of 75 minutes dedicated to the care of this patient on the date of this encounter to include pre-visit review of records, face-to-face time with the patient discussing conditions above, post visit ordering of testing, clinical documentation with the electronic health record, making appropriate referrals as documented, and communicating necessary information to the patient's healthcare team.    The Patient requires high complexity decision making for assessment and support, frequent evaluation and titration of therapies, application of advanced monitoring technologies and extensive interpretation of multiple databases.  Patient satisfied with Plan of action and management. All questions answered    Nickolas Alm Cellar, M.D.  [facility] Pulmonary & Critical [facility] Medicine  Medical Director [facility] [place]

## 2023-12-20 NOTE — Progress Notes (Signed)
 Dr Isaiah requested spouse to bring in home vent to trial patient on. Able to wean patient down to 28% trach collar earlier. O2 saturation has maintained in high 90s. Transitioned her over to her home vent for sleep per home settings. She does not use o2 with her vent. I have however placed o2 at 2liters in line for tonight and will monitor before room air trial.

## 2023-12-20 NOTE — Plan of Care (Signed)
 Continuing with plan of care.

## 2023-12-20 NOTE — Progress Notes (Signed)
 Husband brought in vent from home as requested by provider.

## 2023-12-20 NOTE — Care Management Important Message (Signed)
 Important Message  Patient Details  Name: Angela David MRN: [id number] Date of Birth: February 04, 1941   Important Message Given:  Yes - Medicare IM     Rojelio [name] 12/20/2023, 1:26 PM

## 2023-12-20 NOTE — Progress Notes (Signed)
 Nutrition Follow-up  DOCUMENTATION CODES:   Not applicable  INTERVENTION:   -Continue NPO -TF currently on hold secondary to tube malfunction -When able to resume TF:   -325 ml ( 1 carton) Mallie Pinion Standard 1.5 4 times daily  50 ml free water  flush before and after each feeding administration  Tube feeding regimen provides 1820 kcal (100% of needs), 80 grams of protein, and 1036 ml of H2O. Total free water : 1436 ml daily  NUTRITION DIAGNOSIS:   Inadequate oral intake related to inability to eat as evidenced by NPO status.  GOAL:   Patient will meet greater than or equal to 90% of their needs  MONITOR:   TF tolerance  REASON FOR ASSESSMENT:   Consult Enteral/tube feeding initiation and management  ASSESSMENT:   83 y.o. 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  Patient admitted with acute respiratory failure with hypoxia.   Reviewed I/O's: -950 ml x 24 hours and +266 ml since admission  UOP: 1.5 L x 24 hours  Ms Noyes is familiar to RD team due to multiple prior admission. She receives sole source nutrition via g-tube secondary to dysphagia since 2021. She also has a trach and is on nocturnal vent support. Home TF regimen is as follows (managed by [facility]): 1 carton (325 ml) Mallie Pinion Standard 1.4 4 times daily with 50 ml free water  flush before and after each feed. Regimen provides 1820 kcals, 80 grams protein, and 1436 ml daily.   Spoke with Ms Ates at bedside, who was smiling at this RD when entering room. No family present at time of visit. She was currently on vent support and able to  communicate with head nods and gestures. She shares that TF has been going horribly at home (underwent 4 F g-tube exchange on 10/15/23). She estimates she has been having issues since last admission, stating TF spews out when trying to administer feeds and water . Per MD notes, PEG site appears infected and has a lot of leaking around the tube, suspecting the water  and feeding is leaking out. Noted orders for IR consult. Feedings are being held due to tube malfunction.   She denies any weight loss. Reviewed weight history; noted weight has been stable over the past 2 months.   Medications reviewed and include folic acid , lasix , neurontin , and protonix .   Lab Results  Component Value Date   HGBA1C 7.6 (H) 10/08/2023   PTA DM medications are 25 units tuojeo daily and 0-5 units humalog TID.   Labs reviewed: CBGS: 151-333 (inpatient orders for glycemic control are 0-9 units insulin  aspart every 4 hours and 20 units insulin  glargine-yfgn daily).    NUTRITION - FOCUSED PHYSICAL EXAM:  Flowsheet Row Most Recent Value  Orbital Region No depletion  Upper Arm Region Mild depletion  Thoracic and Lumbar Region No depletion  Buccal Region No depletion  Temple Region Mild depletion  Clavicle Bone Region No depletion  Clavicle and Acromion Bone Region No depletion  Scapular Bone Region No depletion  Dorsal Hand Mild depletion  Patellar Region No depletion  Anterior Thigh Region No depletion  Posterior Calf Region No depletion  Edema (RD Assessment) Mild  Hair Reviewed  Eyes Reviewed  Mouth Reviewed  Skin  Reviewed  Nails Reviewed    Diet Order:   Diet Order             Diet NPO time specified  Diet effective now                   EDUCATION NEEDS:   Education needs have been addressed  Skin:  Skin Assessment: Reviewed RN Assessment  Last BM:  12/17/23  Height:   Ht Readings from Last 1 Encounters:  12/18/23 5' 5 (1.651 m)    Weight:   Wt Readings from Last 1  Encounters:  12/20/23 74.3 kg    Ideal Body Weight:  56.8 kg  BMI:  Body mass index is 27.26 kg/m.  Estimated Nutritional Needs:   Kcal:  1650-1850  Protein:  80-100 grams  Fluid:  1.6-1.8 L    Margery ORN, RD, LDN, CDCES Registered Dietitian III Certified Diabetes Care and Education Specialist If unable to reach this RD, please use RD Inpatient group chat on secure chat between hours of 8am-4 pm daily

## 2023-12-21 DIAGNOSIS — J9621 Acute and chronic respiratory failure with hypoxia: Secondary | ICD-10-CM | POA: Diagnosis not present

## 2023-12-21 LAB — BASIC METABOLIC PANEL WITH GFR
Anion gap: 16 — ABNORMAL HIGH (ref 5–15)
BUN: 46 mg/dL — ABNORMAL HIGH (ref 8–23)
CO2: 27 mmol/L (ref 22–32)
Calcium: 10.3 mg/dL (ref 8.9–10.3)
Chloride: 96 mmol/L — ABNORMAL LOW (ref 98–111)
Creatinine, Ser: 1.03 mg/dL — ABNORMAL HIGH (ref 0.44–1.00)
GFR, Estimated: 55 mL/min — ABNORMAL LOW (ref >60)
Glucose, Bld: 135 mg/dL — ABNORMAL HIGH (ref 70–99)
Potassium: 3.4 mmol/L — ABNORMAL LOW (ref 3.5–5.1)
Sodium: 139 mmol/L (ref 135–145)

## 2023-12-21 LAB — CBC WITH DIFFERENTIAL/PLATELET
Abs Immature Granulocytes: 0.12 K/uL — ABNORMAL HIGH (ref 0.00–0.07)
Basophils Absolute: 0 K/uL (ref 0.0–0.1)
Basophils Relative: 0 %
Eosinophils Absolute: 0 K/uL (ref 0.0–0.5)
Eosinophils Relative: 0 %
HCT: 42.6 % (ref 36.0–46.0)
Hemoglobin: 14.4 g/dL (ref 12.0–15.0)
Immature Granulocytes: 1 %
Lymphocytes Relative: 21 %
Lymphs Abs: 3 K/uL (ref 0.7–4.0)
MCH: 28.2 pg (ref 26.0–34.0)
MCHC: 33.8 g/dL (ref 30.0–36.0)
MCV: 83.5 fL (ref 80.0–100.0)
Monocytes Absolute: 0.9 K/uL (ref 0.1–1.0)
Monocytes Relative: 6 %
Neutro Abs: 10.4 K/uL — ABNORMAL HIGH (ref 1.7–7.7)
Neutrophils Relative %: 72 %
Platelets: 228 K/uL (ref 150–400)
RBC: 5.1 MIL/uL (ref 3.87–5.11)
RDW: 12.8 % (ref 11.5–15.5)
WBC: 14.4 K/uL — ABNORMAL HIGH (ref 4.0–10.5)
nRBC: 0 % (ref 0.0–0.2)

## 2023-12-21 LAB — GLUCOSE, CAPILLARY
Glucose-Capillary: 253 mg/dL — ABNORMAL HIGH (ref 70–99)
Glucose-Capillary: 205 mg/dL — ABNORMAL HIGH (ref 70–99)
Glucose-Capillary: 95 mg/dL (ref 70–99)
Glucose-Capillary: 227 mg/dL — ABNORMAL HIGH (ref 70–99)
Glucose-Capillary: 405 mg/dL — ABNORMAL HIGH (ref 70–99)
Glucose-Capillary: 360 mg/dL — ABNORMAL HIGH (ref 70–99)

## 2023-12-21 MED ORDER — INSULIN ASPART 100 UNIT/ML IJ SOLN
0.0000 [IU] | Freq: Three times a day (TID) | INTRAMUSCULAR | Status: DC
  Administered 2023-12-22: 5 [IU] via SUBCUTANEOUS
  Administered 2023-12-22: 3 [IU] via SUBCUTANEOUS
  Administered 2023-12-22 – 2023-12-23 (×2): 5 [IU] via SUBCUTANEOUS
  Administered 2023-12-23: 11 [IU] via SUBCUTANEOUS
  Administered 2023-12-23: 15 [IU] via SUBCUTANEOUS
  Filled 2023-12-21 (×6): qty 1

## 2023-12-21 MED ORDER — POTASSIUM CHLORIDE 20 MEQ PO PACK
40.0000 meq | PACK | Freq: Once | ORAL | Status: AC
  Administered 2023-12-21: 40 meq
  Filled 2023-12-21: qty 2

## 2023-12-21 MED ORDER — METOPROLOL TARTRATE 25 MG PO TABS
12.5000 mg | ORAL_TABLET | Freq: Two times a day (BID) | ORAL | Status: DC
  Administered 2023-12-21: 12.5 mg via ORAL
  Filled 2023-12-21: qty 1

## 2023-12-21 MED ORDER — CHLORHEXIDINE GLUCONATE CLOTH 2 % EX PADS
6.0000 | MEDICATED_PAD | Freq: Every day | CUTANEOUS | Status: DC
  Administered 2023-12-21: 6 via TOPICAL

## 2023-12-21 MED ORDER — ROPINIROLE HCL 0.25 MG PO TABS
0.5000 mg | ORAL_TABLET | Freq: Every day | ORAL | Status: DC
  Administered 2023-12-21: 0.5 mg via ORAL
  Filled 2023-12-21: qty 2

## 2023-12-21 MED ORDER — INSULIN ASPART 100 UNIT/ML IJ SOLN
12.0000 [IU] | Freq: Once | INTRAMUSCULAR | Status: AC
  Administered 2023-12-21: 12 [IU] via SUBCUTANEOUS

## 2023-12-21 MED ORDER — MELATONIN 5 MG PO TABS: 5.0000 mg | ORAL_TABLET | Freq: Every evening | ORAL | Status: DC | PRN

## 2023-12-21 MED ORDER — INSULIN ASPART 100 UNIT/ML IJ SOLN
0.0000 [IU] | Freq: Every day | INTRAMUSCULAR | Status: DC
  Administered 2023-12-21: 5 [IU] via SUBCUTANEOUS
  Administered 2023-12-22: 4 [IU] via SUBCUTANEOUS
  Filled 2023-12-21 (×3): qty 1

## 2023-12-21 MED ORDER — GABAPENTIN 400 MG PO CAPS
400.0000 mg | ORAL_CAPSULE | Freq: Three times a day (TID) | ORAL | Status: DC
  Administered 2023-12-21: 400 mg via ORAL
  Filled 2023-12-21: qty 1

## 2023-12-21 MED ORDER — IPRATROPIUM-ALBUTEROL 0.5-2.5 (3) MG/3ML IN SOLN
3.0000 mL | Freq: Two times a day (BID) | RESPIRATORY_TRACT | Status: DC
  Administered 2023-12-21 – 2023-12-23 (×4): 3 mL via RESPIRATORY_TRACT
  Filled 2023-12-21 (×3): qty 3

## 2023-12-21 MED ORDER — ADULT MULTIVITAMIN W/MINERALS CH
1.0000 | ORAL_TABLET | Freq: Every day | ORAL | Status: DC
  Administered 2023-12-22: 1 via ORAL
  Filled 2023-12-21: qty 1

## 2023-12-21 MED ORDER — POLYETHYLENE GLYCOL 3350 17 G PO PACK: 17.0000 g | PACK | Freq: Every day | ORAL | Status: DC | PRN

## 2023-12-21 MED ORDER — EZETIMIBE 10 MG PO TABS: 10.0000 mg | ORAL_TABLET | Freq: Every day | ORAL | Status: DC

## 2023-12-21 MED ORDER — CHLORHEXIDINE GLUCONATE CLOTH 2 % EX PADS: 6.0000 | MEDICATED_PAD | Freq: Every day | CUTANEOUS | Status: DC

## 2023-12-21 NOTE — Progress Notes (Signed)
 Placed patient on her home vent per her home settings. Vent appears to be in proper functioning order. No changes made to vent settings. Patient has been on room air since my shift. Saturation has been in upper 90's. Patient has not been distressed. No o2 with home vent. Will continue to monitor.

## 2023-12-21 NOTE — Progress Notes (Signed)
 Progress Note   Patient: Angela David FMW:[id number] DOB: Nov 07, 1940 DOA: 12/18/2023     3 DOS: the patient was seen and examined on 12/21/2023   Brief hospital course: Angela David is a 83 y.o. 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     Assessment and Plan:   Acute on chronic respiratory failure with hypoxia: Etiology is not clear.  PCR negative for COVID, flu and RSV.  Chest x-ray showed interstitial prominence, but no clear infiltrate.   Patient being admitted to the progressive care unit Continue current antibiotic therapy Continue IV Solu-Medrol Added IV Lasix  given edema seen on x-ray Continue Mucinex  for cough Follow-up urine legionella and S. pneumococcal antigen - Follow up blood culture x2, sputum culture     Pulmonary fibrosis (HCC) Continue as needed nebulization   Tracheostomy present (HCC) RT on board for trach care   Chronic diastolic CHF (congestive heart failure) (HCC): 2D echo on 10/09/2023 showed EF of 60 to 65%.  Patient has trace leg edema, no JVD.  Does not seem to have CHF exacerbation. BNP elevated Continue IV Lasix    Type II diabetes mellitus with renal manifestations [facility]): Recent A1c 7.6, poorly controlled.  Patient taking Humalog and Toujeo  25 units daily -Glargine insulin  20 units daily Continue SSI   Diabetic neuropathy (HCC) - Continue gabapentin    Depression with anxiety -Continue home medications   Overweight (BMI 25.0-29.9): Body weight 77.1 kg, BMI 28.29 - Encourage losing weight - Exercise healthy diet   S/p of PEG: IR team recommends continuation of  tube feeding Patient is not ready for tube manipulation at this time and still thinking about definitive plan in the setting of the leak until Monday to finalize her decision.   DVT ppx: SQ Lovenox    Code Status: Full code    Family Communication:     not done, no family member is at bed side.       Disposition Plan:  Anticipate discharge back to previous environment   Consults called:  none     Subjective:  Patient seen and examined at bedside this morning Denies nausea vomiting abdominal pain chest pain cough Was seen on 10 L of oxygen Nursing staff trying to wean this down   Physical Exam:   General: Has acute respiratory distress Heme: No neck lymph node enlargement. Cardiac: S1/S2, RRR, No murmurs, No gallops or rubs. Respiratory: Has coarse sounds bilaterally GI: Soft, nondistended, nontender GU: No hematuria Ext: has trace  leg edema bilaterally.  Musculoskeletal: No joint deformities, No joint redness or warmth, no limitation of ROM in spin. Skin: No rashes.  Neuro: Alert, oriented X3 Psych: Patient is not psychotic, no suicidal or hemocidal ideation.   Data Reviewed: X-ray review showing findings of opacification concerning for infiltrate versus edema   Vitals:   12/21/23 0900 12/21/23 1000 12/21/23 1100 12/21/23 1200  BP: 100/78 117/65 107/66 (!) 108/51  Pulse: 100 82 82 87  Resp: 18 17 20  (!) 23  Temp:      TempSrc:      SpO2: 100% 100% 92% 92%  Weight:      Height:          Latest Ref Rng & Units 12/21/2023  5:20 AM 12/19/2023    4:35 AM 12/18/2023    4:47 PM  CBC  WBC 4.0 - 10.5 K/uL 14.4  9.4  17.3   Hemoglobin 12.0 - 15.0 g/dL 85.5  87.2  85.0   Hematocrit 36.0 - 46.0 % 42.6  38.7  45.9   Platelets 150 - 400 K/uL 228  164  224        Latest Ref Rng & Units 12/21/2023    5:20 AM 12/20/2023   11:01 AM 12/19/2023    4:35 AM  BMP  Glucose 70 - 99 mg/dL 864  860  640   BUN 8 - 23 mg/dL 46  31  26   Creatinine 0.44 - 1.00 mg/dL 8.96  9.16   8.90   Sodium 135 - 145 mmol/L 139  138  134   Potassium 3.5 - 5.1 mmol/L 3.4  4.1  4.8   Chloride 98 - 111 mmol/L 96  98  100   CO2 22 - 32 mmol/L 27  25  22    Calcium  8.9 - 10.3 mg/dL 89.6  9.3  8.8      Author: Drue ONEIDA Potter, MD 12/21/2023 4:45 PM  For on call review [email or web address].

## 2023-12-21 NOTE — Plan of Care (Signed)
 Discussed with patient plan of care for the evening, pain management and bedtime medications with some teach back displayed.  Wears ventilator at night  Problem: Education: Goal: Ability to describe self-care measures that may prevent or decrease complications (Diabetes Survival Skills Education) will improve Outcome: Progressing   Problem: Respiratory: Goal: Ability to maintain adequate ventilation will improve Outcome: Progressing   Problem: Education: Goal: Knowledge of General Education information will improve Description: Including pain rating scale, medication(s)/side effects and non-pharmacologic comfort measures Outcome: Progressing   Problem: Pain Managment: Goal: General experience of comfort will improve and/or be controlled Outcome: Progressing

## 2023-12-22 ENCOUNTER — Inpatient Hospital Stay

## 2023-12-22 DIAGNOSIS — J9621 Acute and chronic respiratory failure with hypoxia: Secondary | ICD-10-CM | POA: Diagnosis not present

## 2023-12-22 LAB — CBC WITH DIFFERENTIAL/PLATELET
Abs Immature Granulocytes: 0.09 K/uL — ABNORMAL HIGH (ref 0.00–0.07)
Basophils Absolute: 0 K/uL (ref 0.0–0.1)
Basophils Relative: 0 %
Eosinophils Absolute: 0 K/uL (ref 0.0–0.5)
Eosinophils Relative: 0 %
HCT: 45.5 % (ref 36.0–46.0)
Hemoglobin: 14.9 g/dL (ref 12.0–15.0)
Immature Granulocytes: 1 %
Lymphocytes Relative: 24 %
Lymphs Abs: 2.9 K/uL (ref 0.7–4.0)
MCH: 27.9 pg (ref 26.0–34.0)
MCHC: 32.7 g/dL (ref 30.0–36.0)
MCV: 85 fL (ref 80.0–100.0)
Monocytes Absolute: 1.1 K/uL — ABNORMAL HIGH (ref 0.1–1.0)
Monocytes Relative: 9 %
Neutro Abs: 8.1 K/uL — ABNORMAL HIGH (ref 1.7–7.7)
Neutrophils Relative %: 66 %
Platelets: 231 K/uL (ref 150–400)
RBC: 5.35 MIL/uL — ABNORMAL HIGH (ref 3.87–5.11)
RDW: 12.9 % (ref 11.5–15.5)
WBC: 12.2 K/uL — ABNORMAL HIGH (ref 4.0–10.5)
nRBC: 0 % (ref 0.0–0.2)

## 2023-12-22 LAB — BASIC METABOLIC PANEL WITH GFR
Anion gap: 15 (ref 5–15)
BUN: 56 mg/dL — ABNORMAL HIGH (ref 8–23)
CO2: 26 mmol/L (ref 22–32)
Calcium: 9.6 mg/dL (ref 8.9–10.3)
Chloride: 100 mmol/L (ref 98–111)
Creatinine, Ser: 1.45 mg/dL — ABNORMAL HIGH (ref 0.44–1.00)
GFR, Estimated: 36 mL/min — ABNORMAL LOW (ref >60)
Glucose, Bld: 200 mg/dL — ABNORMAL HIGH (ref 70–99)
Potassium: 3.4 mmol/L — ABNORMAL LOW (ref 3.5–5.1)
Sodium: 141 mmol/L (ref 135–145)

## 2023-12-22 LAB — GLUCOSE, CAPILLARY
Glucose-Capillary: 184 mg/dL — ABNORMAL HIGH (ref 70–99)
Glucose-Capillary: 202 mg/dL — ABNORMAL HIGH (ref 70–99)
Glucose-Capillary: 225 mg/dL — ABNORMAL HIGH (ref 70–99)
Glucose-Capillary: 314 mg/dL — ABNORMAL HIGH (ref 70–99)

## 2023-12-22 LAB — MAGNESIUM: Magnesium: 2.4 mg/dL (ref 1.7–2.4)

## 2023-12-22 MED ORDER — ACETAMINOPHEN 325 MG PO TABS: 325.0000 mg | ORAL_TABLET | Freq: Four times a day (QID) | ORAL | Status: DC | PRN

## 2023-12-22 MED ORDER — FAMOTIDINE 20 MG PO TABS
20.0000 mg | ORAL_TABLET | Freq: Every day | ORAL | Status: DC
  Administered 2023-12-22 – 2023-12-23 (×2): 20 mg
  Filled 2023-12-22 (×2): qty 1

## 2023-12-22 MED ORDER — POTASSIUM CHLORIDE 20 MEQ PO PACK
40.0000 meq | PACK | Freq: Two times a day (BID) | ORAL | Status: AC
  Administered 2023-12-22 (×2): 40 meq
  Filled 2023-12-22 (×2): qty 2

## 2023-12-22 MED ORDER — MELATONIN 5 MG PO TABS: 5.0000 mg | ORAL_TABLET | Freq: Every evening | ORAL | Status: DC | PRN

## 2023-12-22 MED ORDER — ROPINIROLE HCL 0.25 MG PO TABS
0.5000 mg | ORAL_TABLET | Freq: Every day | ORAL | Status: DC
Start: 2023-12-22 — End: 2023-12-23
  Administered 2023-12-22: 0.5 mg
  Filled 2023-12-22 (×2): qty 2

## 2023-12-22 MED ORDER — GABAPENTIN 400 MG PO CAPS
400.0000 mg | ORAL_CAPSULE | Freq: Three times a day (TID) | ORAL | Status: DC
  Administered 2023-12-22 – 2023-12-23 (×4): 400 mg
  Filled 2023-12-22 (×4): qty 1

## 2023-12-22 MED ORDER — METOPROLOL TARTRATE 25 MG PO TABS
12.5000 mg | ORAL_TABLET | Freq: Two times a day (BID) | ORAL | Status: DC
  Administered 2023-12-22 – 2023-12-23 (×2): 12.5 mg
  Filled 2023-12-22 (×2): qty 1

## 2023-12-22 MED ORDER — EZETIMIBE 10 MG PO TABS
10.0000 mg | ORAL_TABLET | Freq: Every day | ORAL | Status: DC
Start: 2023-12-23 — End: 2023-12-23
  Administered 2023-12-23: 10 mg
  Filled 2023-12-22: qty 1

## 2023-12-22 MED ORDER — ORAL CARE MOUTH RINSE
15.0000 mL | OROMUCOSAL | Status: DC
  Administered 2023-12-22 – 2023-12-23 (×6): 15 mL via OROMUCOSAL

## 2023-12-22 MED ORDER — ESCITALOPRAM OXALATE 20 MG PO TABS
20.0000 mg | ORAL_TABLET | Freq: Every day | ORAL | Status: DC
Start: 2023-12-23 — End: 2023-12-23
  Administered 2023-12-23: 20 mg
  Filled 2023-12-22: qty 1

## 2023-12-22 MED ORDER — ORAL CARE MOUTH RINSE: 15.0000 mL | OROMUCOSAL | Status: DC | PRN

## 2023-12-22 MED ORDER — ADULT MULTIVITAMIN W/MINERALS CH
1.0000 | ORAL_TABLET | Freq: Every day | ORAL | Status: DC
Start: 2023-12-23 — End: 2023-12-23
  Administered 2023-12-23: 1
  Filled 2023-12-22: qty 1

## 2023-12-22 MED ORDER — FOLIC ACID 1 MG PO TABS
1.0000 mg | ORAL_TABLET | Freq: Every day | ORAL | Status: DC
Start: 2023-12-23 — End: 2023-12-23
  Administered 2023-12-23: 1 mg
  Filled 2023-12-22: qty 1

## 2023-12-22 NOTE — Progress Notes (Signed)
 Progress Note   Patient: Angela David FMW:[id number] DOB: Jan 30, 1941 DOA: 12/18/2023     4 DOS: the patient was seen and examined on 12/22/2023      Brief hospital course: Angela David is a 83 y.o. 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     Assessment and Plan:   Acute on chronic respiratory failure with hypoxia: Etiology is not clear.  PCR negative for COVID, flu and RSV.  Chest x-ray showed interstitial prominence, but no clear infiltrate.   Patient being admitted to the progressive care unit Continue current antibiotic therapy Continue IV Solu-Medrol IV Lasix  on hold as renal function is slightly worse today Repeat chest x-ray showing clearance of pulmonary edema Continue Mucinex  for cough     Pulmonary fibrosis (HCC) Continue as needed nebulization   Tracheostomy present (HCC) RT on board for trach care   Chronic diastolic CHF (congestive heart failure) (HCC): 2D echo on 10/09/2023 showed EF of 60 to 65%.  Patient has trace leg edema, no JVD.  Does not seem to have CHF exacerbation. BNP elevated IV Lasix  withheld as renal function is slightly worse today   Type II diabetes mellitus with renal manifestations [facility]): Recent A1c 7.6, poorly controlled.  Patient taking Humalog and Toujeo  25 units daily -Glargine insulin  20 units daily Continue SSI   Diabetic neuropathy (HCC) - Continue gabapentin    Depression with anxiety -Continue home medications   Overweight (BMI 25.0-29.9): Body weight 77.1 kg, BMI 28.29 - Encourage losing weight - Exercise healthy diet   S/p of PEG: IR team  recommends continuation of tube feeding Patient is not ready for tube manipulation at this time and still thinking about definitive plan in the setting of the leak until Monday to finalize her decision.   DVT ppx: SQ Lovenox    Code Status: Full code    Family Communication:     not done, no family member is at bed side.       Disposition Plan:  Anticipate discharge back to previous environment   Consults called:  none     Subjective:  Patient was saturating well on room air Denies nausea vomiting chest pain cough Uses ventilator  at night IR planning on PEG tube interrogation tomorrow   Physical Exam:   General: Has acute respiratory distress Heme: No neck lymph node enlargement. Cardiac: S1/S2, RRR, No murmurs, No gallops or rubs. Respiratory: Has coarse sounds bilaterally GI: Soft, nondistended, nontender GU: No hematuria Ext: has trace  leg edema bilaterally.  Musculoskeletal: No joint deformities, No joint redness or warmth, no limitation of ROM in spin. Skin: No rashes.  Neuro: Alert, oriented X3 Psych: Patient is not psychotic, no suicidal or hemocidal ideation.   Data Reviewed: I have reviewed chest x-ray ordered today that did not show any acute intrapulmonary pathology      Latest Ref Rng & Units 12/22/2023    4:55 AM 12/21/2023    5:20 AM 12/19/2023    4:35 AM  CBC  WBC 4.0 - 10.5 K/uL 12.2  14.4  9.4   Hemoglobin 12.0 - 15.0 g/dL 85.0  85.5  87.2   Hematocrit 36.0 - 46.0 % 45.5  42.6  38.7   Platelets 150 - 400  K/uL 231  228  164        Latest Ref Rng & Units 12/22/2023    4:55 AM 12/21/2023    5:20 AM 12/20/2023   11:01 AM  BMP  Glucose 70 - 99 mg/dL 799  864  860   BUN 8 - 23 mg/dL 56  46  31   Creatinine 0.44 - 1.00 mg/dL 8.54  8.96  9.16   Sodium 135 - 145 mmol/L 141  139  138   Potassium 3.5 - 5.1 mmol/L 3.4  3.4  4.1   Chloride 98 - 111 mmol/L 100  96  98   CO2 22 - 32 mmol/L 26  27  25    Calcium  8.9 - 10.3 mg/dL 9.6  89.6  9.3       Vitals:   12/22/23 0600 12/22/23 0700 12/22/23 0800 12/22/23 0900  BP: (!) 115/49 (!) 114/54 (!) 133/58 124/74  Pulse: 89 88 95 (!) 108  Resp: 15 16 15 19   Temp:   98.9 F (37.2 C)   TempSrc:   Oral   SpO2: 97% 98% 96% 95%  Weight:      Height:        Author: Drue ONEIDA Potter, MD 12/22/2023 2:12 PM  For on call review [email or web address].

## 2023-12-23 DIAGNOSIS — J9621 Acute and chronic respiratory failure with hypoxia: Secondary | ICD-10-CM | POA: Diagnosis not present

## 2023-12-23 LAB — CBC WITH DIFFERENTIAL/PLATELET
Abs Immature Granulocytes: 0.13 K/uL — ABNORMAL HIGH (ref 0.00–0.07)
Basophils Absolute: 0 K/uL (ref 0.0–0.1)
Basophils Relative: 0 %
Eosinophils Absolute: 0 K/uL (ref 0.0–0.5)
Eosinophils Relative: 0 %
HCT: 41.9 % (ref 36.0–46.0)
Hemoglobin: 13.7 g/dL (ref 12.0–15.0)
Immature Granulocytes: 1 %
Lymphocytes Relative: 19 %
Lymphs Abs: 2.6 K/uL (ref 0.7–4.0)
MCH: 28.1 pg (ref 26.0–34.0)
MCHC: 32.7 g/dL (ref 30.0–36.0)
MCV: 85.9 fL (ref 80.0–100.0)
Monocytes Absolute: 0.8 K/uL (ref 0.1–1.0)
Monocytes Relative: 6 %
Neutro Abs: 10 K/uL — ABNORMAL HIGH (ref 1.7–7.7)
Neutrophils Relative %: 74 %
Platelets: 222 K/uL (ref 150–400)
RBC: 4.88 MIL/uL (ref 3.87–5.11)
RDW: 13 % (ref 11.5–15.5)
WBC: 13.4 K/uL — ABNORMAL HIGH (ref 4.0–10.5)
nRBC: 0 % (ref 0.0–0.2)

## 2023-12-23 LAB — BASIC METABOLIC PANEL WITH GFR
Anion gap: 13 (ref 5–15)
BUN: 53 mg/dL — ABNORMAL HIGH (ref 8–23)
CO2: 21 mmol/L — ABNORMAL LOW (ref 22–32)
Calcium: 9.4 mg/dL (ref 8.9–10.3)
Chloride: 104 mmol/L (ref 98–111)
Creatinine, Ser: 1.24 mg/dL — ABNORMAL HIGH (ref 0.44–1.00)
GFR, Estimated: 44 mL/min — ABNORMAL LOW (ref >60)
Glucose, Bld: 246 mg/dL — ABNORMAL HIGH (ref 70–99)
Potassium: 4.3 mmol/L (ref 3.5–5.1)
Sodium: 138 mmol/L (ref 135–145)

## 2023-12-23 LAB — CULTURE, BLOOD (ROUTINE X 2)
Culture: NO GROWTH
Culture: NO GROWTH

## 2023-12-23 LAB — GLUCOSE, CAPILLARY
Glucose-Capillary: 214 mg/dL — ABNORMAL HIGH (ref 70–99)
Glucose-Capillary: 371 mg/dL — ABNORMAL HIGH (ref 70–99)
Glucose-Capillary: 318 mg/dL — ABNORMAL HIGH (ref 70–99)

## 2023-12-23 MED ORDER — AMOXICILLIN-POT CLAVULANATE 400-57 MG/5ML PO SUSR: 800.0000 mg | Freq: Two times a day (BID) | ORAL | 0 refills | Status: DC

## 2023-12-23 MED ORDER — METOPROLOL TARTRATE 25 MG PO TABS: 12.5000 mg | ORAL_TABLET | Freq: Two times a day (BID) | ORAL | 2 refills | Status: DC

## 2023-12-23 NOTE — Progress Notes (Signed)
 PT taken off of vent to room air.

## 2023-12-23 NOTE — Plan of Care (Signed)
  Problem: Education: Goal: Ability to describe self-care measures that may prevent or decrease complications (Diabetes Survival Skills Education) will improve Outcome: Progressing   Problem: Coping: Goal: Ability to adjust to condition or change in health will improve Outcome: Progressing   Problem: Fluid Volume: Goal: Ability to maintain a balanced intake and output will improve Outcome: Progressing   Problem: Clinical Measurements: Goal: Ability to maintain a body temperature in the normal range will improve Outcome: Progressing   Problem: Health Behavior/Discharge Planning: Goal: Ability to manage health-related needs will improve Outcome: Not Progressing   Problem: Metabolic: Goal: Ability to maintain appropriate glucose levels will improve Outcome: Not Progressing   Problem: Nutritional: Goal: Maintenance of adequate nutrition will improve Outcome: Not Progressing   Problem: Skin Integrity: Goal: Risk for impaired skin integrity will decrease Outcome: Not Progressing

## 2023-12-23 NOTE — Inpatient Diabetes Management (Signed)
 Inpatient Diabetes Program Recommendations  AACE/ADA: New Consensus Statement on Inpatient Glycemic Control   Target Ranges:  Prepandial:   less than 140 mg/dL      Peak postprandial:   less than 180 mg/dL (1-2 hours)      Critically ill patients:  140 - 180 mg/dL    Latest Reference Range & Units 12/22/23 07:44 12/22/23 11:13 12/22/23 16:03 12/22/23 21:19 12/23/23 07:35  Glucose-Capillary 70 - 99 mg/dL 815 (H) 797 (H) 774 (H) 314 (H) 214 (H)    Review of Glycemic Control  Diabetes history: DM2 Outpatient Diabetes medications: Humalog 0-5 units TID, Toujeo  25 units daily Current orders for Inpatient glycemic control: Semglee  20 units daily, Novolog  0-15 units TID with meals, Novolog  0-5 units at bedtime; Solumedrol 80 mg daily, [place] 325 ml QID (10:00, 14:00, 18:00, 22:00)  Inpatient Diabetes Program Recommendations:    Insulin : Please consider ordering Novolog  4 units QID to be given with tube feeding boluses (10:00, 14:00, 18:00, 22:00). If tube feeding is stopped or held then Novolog  tube feeding coverage should also be stopped or held.  Thanks, Earnie Gainer, RN, MSN, CDCES Diabetes Coordinator Inpatient Diabetes Program [phone] (Team Pager from 8am to 5pm)

## 2023-12-23 NOTE — Progress Notes (Signed)
 [name] and O x 4. VSS. PT tolerating diet well. No complaints of pain or nausea. IV removed intact, prescriptions given. Pt voiced understanding of discharge instructions with no further questions. Pt discharged via EMS  Angela David

## 2023-12-23 NOTE — Plan of Care (Signed)
 Brief Interventional Radiology Note:  83 year old female with a history of vocal cord dysfunction w/ chronic hypoxic respiratory failure, chronic tracheostomy, pulmonary fibrosis, and dysphagia s/p PEG tube placement in 2021 by Dr. JONETTA Luna, currently admitted for acute on chronic respiratory failure. During her admission IR was consulted to discuss management options of her gastrostomy tube due to increased leaking from the insertion site.   Patient known to IR from previous gastrostomy tube exchanges with the most recent taking place on 8/19 in which the tube was sutured to the abdominal wall in an effort to prevent additional leaking from around the insertion site. She also has a known ventral hernia in which the stomach and gastrostomy balloon are located. Over time, patient's abdominal wall tissue has become very thin in the area of the g-tube leading to large amounts of leaking from around the site, as well as some skin breakdown.   Case was discussed with IR attending and patient was presented with two options: continued suturing of g-tube bumper to the abdominal wall in an effort to reduce the amount of leaking or removal of the g-tube and ultimately replacement with a new tube. These options were initially discussed, at length, with the patient at the bedside on 10/24. She requested the weekend to think about her options.  After further discussion with patient today, she states that she does not wish to have any intervention at this time and will do her best to continue managing leaking from her g-tube on her own at home. Patient does have IR contact information and is agreeable to reach out if she has any additional questions or concerns or would like to reconsider one of these options at a later time.   Thank you for allowing our service to participate in Angela David's care.   Electronically Signed: Shell Yandow M Collyn Selk, PA-C 12/23/2023, 4:37 PM

## 2023-12-23 NOTE — Discharge Summary (Signed)
 Physician Discharge Summary   Patient: Angela David MRN: [id number] DOB: Feb 13, 1941  Admit date:     12/18/2023  Discharge date: 12/23/23  Discharge Physician: Drue ONEIDA Potter   PCP: [name], MD   Recommendations at discharge:   Follow-up with your outpatient physicians  Discharge Diagnoses: Principal Problem:   Acute on chronic respiratory failure with hypoxia (HCC) Active Problems:   Pulmonary fibrosis (HCC)   Tracheostomy present (HCC)   Chronic diastolic CHF (congestive heart failure) (HCC)   Type II diabetes mellitus with renal manifestations (HCC)   Diabetic neuropathy (HCC)   Depression with anxiety   Overweight (BMI 25.0-29.9)  Resolved Problems:   * No resolved hospital problems. *  Hospital Course: Angela David is a 83 y.o. 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     Assessment and Plan:   Acute on chronic respiratory failure with hypoxia: Etiology is not clear.  This is likely secondary to aspiration pneumonia PCR negative for COVID, flu and RSV.  Chest x-ray showed interstitial prominence, but no clear infiltrate.   Patient being admitted to the progressive care unit Patient improved with antibiotic therapy Has completed 5 days course of antibiotics     Pulmonary fibrosis (HCC) Continue as needed nebulization   Tracheostomy present (HCC) RT on board for trach care   Chronic diastolic CHF (congestive heart failure) (HCC): 2D echo on 10/09/2023 showed EF of 60 to 65%.  Patient has trace leg edema, no JVD.  Does not seem to have CHF exacerbation. Patient received some  IV Lasix  with improvement   Type II diabetes mellitus with renal manifestations [facility]): Resume home medication at discharge   Diabetic neuropathy (HCC) - Continue gabapentin    Depression with anxiety -Continue home medications   Overweight (BMI 25.0-29.9): Body weight 77.1 kg, BMI 28.29 - Encourage losing weight - Exercise healthy diet   S/p of PEG: Patient has deferred any IR intervention concerning the PEG tube  Consultants: Pulmonary Procedures performed: None Disposition: Home Diet recommendation:  Tube feeding DISCHARGE MEDICATION: Allergies as of 12/23/2023       Reactions   Shellfish Allergy Shortness Of Breath   respitatory distress    Sodium Sulfite Shortness Of Breath   Severe Congestion that creates inability to breath   Sulfa Antibiotics Shortness Of Breath, Other (See Comments)   respitatory distress   Codeine Other (See Comments)   Per patient it kept her up all night   Glucerna [alimentum] Itching   Wound Dressing Adhesive Rash        Medication List     TAKE these medications    acetaminophen  500 MG tablet Commonly known as: TYLENOL  Take 500-1,000 mg by mouth every 6 (six) hours as needed.   amoxicillin -clavulanate 400-57 MG/5ML suspension Commonly known as: AUGMENTIN  Take 10 mLs (800 mg total) by mouth 2 (two) times daily.   Baza Protect Moisture Barrier 12 % Crea Generic drug: Zinc  Oxide Apply 1 application twice a day by topical route.   buPROPion  150 MG 12 hr tablet Commonly known as: WELLBUTRIN  SR Take 150 mg by mouth 2 (two) times daily.   Cholecalciferol  50 MCG (2000 UT) Caps Take 2,000 Units by mouth daily.   clotrimazole 1 %  cream Commonly known as: LOTRIMIN APPLY A THIN LAYER TO THE AFFECTED AREA 2 TIMES DAILY   diphenhydrAMINE  25 mg capsule Commonly known as: BENADRYL  Take 1 capsule (25 mg total) by mouth every 6 (six) hours as needed for allergies or itching.   EPINEPHrine  0.3 mg/0.3 mL Soaj injection Commonly known  as: EPI-PEN Inject 0.3 mLs (0.3 mg dose) into the muscle once as needed for Anaphylaxis for up to 1 dose.   escitalopram  20 MG tablet Commonly known as: LEXAPRO  TAKE 1 TABLET VIA TUBE DAILY. TAKE ALONG WITH 10 MG DOSE.   estradiol  0.1 MG/GM vaginal cream Commonly known as: ESTRACE  Insert 1 applicatorful twice a week by vaginal route for 30 days.   ezetimibe  10 MG tablet Commonly known as: ZETIA  Take 10 mg by mouth daily.   feeding supplement (KATE FARMS STANDARD 1.4) Liqd liquid Place 1,440 mLs into feeding tube daily.   folic acid  1 MG tablet Commonly known as: FOLVITE  Take 1 tablet by mouth daily.   free water  Soln Place 50 mLs into feeding tube every 4 (four) hours.   FreeStyle Libre 2 Sensor Misc 1 Device by Does not apply route daily.   gabapentin  400 MG capsule Commonly known as: NEURONTIN  Take 400 mg by mouth 3 (three) times daily. What changed: Another medication with the same name was removed. Continue taking this medication, and follow the directions you see here.   Gemtesa  75 MG Tabs Generic drug: Vibegron  Take 1 tablet (75 mg total) by mouth daily.   HumaLOG KwikPen 100 UNIT/ML KwikPen Generic drug: insulin  lispro Inject 0-5 Units into the skin 3 x daily with food.   ipratropium-albuterol  0.5-2.5 (3) MG/3ML Soln Commonly known as: DUONEB Take 3 mLs by nebulization every 6 (six) hours as needed.   loratadine  10 MG tablet Commonly known as: CLARITIN  Place 1 tablet (10 mg total) into feeding tube daily.   lovastatin  20 MG tablet Commonly known as: MEVACOR  Take 20 mg by mouth at bedtime.   meclizine  12.5 MG tablet Commonly known as: ANTIVERT  Take 12.5 mg by mouth 3 (three) times daily as needed for dizziness.   Melatonin 5 MG Caps Take by mouth.   metoprolol  tartrate 25 MG tablet Commonly known as: LOPRESSOR  Place 0.5 tablets (12.5 mg total) into feeding tube 2 (two) times daily.   multivitamin Liqd Place 15 mLs into feeding tube daily.    pantoprazole  40 MG tablet Commonly known as: PROTONIX  Take 40 mg by mouth daily.   Pen Needles 3/16 31G X 5 MM Misc 1 Container by Does not apply route daily.   polyethylene glycol 17 g packet Commonly known as: MIRALAX  / GLYCOLAX  Place 17 g into feeding tube daily as needed for moderate constipation.   Prolia  60 MG/ML Sosy injection Generic drug: denosumab  Inject 1 mL by subcutaneous route.   rOPINIRole  0.5 MG tablet Commonly known as: REQUIP  Take 0.5 mg by mouth at bedtime.   sodium chloride  HYPERTONIC 3 % nebulizer solution Take 4 mLs by nebulization 2 (two) times daily.   Systane Balance 0.6 % Soln Generic drug: Propylene Glycol Place 1 drop into both eyes 2 (two) times daily.   Toujeo  SoloStar 300 UNIT/ML Solostar Pen Generic drug: insulin  glargine (1 Unit Dial) Inject 25 Units into the skin daily.        Discharge Exam: Filed Weights   12/20/23 0500 12/22/23 0223 12/23/23 0438  Weight: 74.3 kg 67.9 kg 69 kg   General: Elderly female in no acute distress Heme: No neck  lymph node enlargement. Cardiac: S1/S2, RRR, No murmurs, No gallops or rubs. Respiratory: Clear to auscultation bilaterally GI: Soft, nondistended, nontender GU: No hematuria Ext: Edema resolved Musculoskeletal: No joint deformities, No joint redness or warmth, no limitation of ROM in spin. Skin: No rashes.  Neuro: Alert, oriented X3 Psych: Patient is not psychotic, no suicidal or hemocidal ideation.  Condition at discharge: good  The results of significant diagnostics from this hospitalization (including imaging, microbiology, ancillary and laboratory) are listed below for reference.   Imaging Studies: DG Chest Port 1 View Result Date: 12/22/2023 EXAM: 1 VIEW(S) XRAY OF THE CHEST 12/22/2023 08:26:50 AM COMPARISON: 12/18/2023 CLINICAL HISTORY: Pleural effusion 142230. Per ordering note pleural effusion. FINDINGS: LINES, TUBES AND DEVICES: Tracheostomy tube in place. LUNGS AND PLEURA:  Chronic elevation of right diaphragm. Persistent coarsened interstitial markings. No focal pulmonary opacity. No pulmonary edema. No pleural effusion. No pneumothorax. HEART AND MEDIASTINUM: Aortic atherosclerosis. No acute abnormality of the cardiac and mediastinal silhouettes. BONES AND SOFT TISSUES: Right axillary surgical clips noted. No acute osseous abnormality. IMPRESSION: 1. No acute cardiopulmonary process identified. Electronically signed by: Waddell Calk MD 12/22/2023 08:39 AM EDT RP Workstation: [id number]   DG Abd 1 View Result Date: 12/20/2023 CLINICAL DATA:  83 year old female with abdominal pain. EXAM: ABDOMEN - 1 VIEW COMPARISON:  Abdominal radiographs 10/10/2023 and earlier. FINDINGS: Two supine views at 0845 hours. Percutaneous gastrostomy tube remains in place. Nonobstructed bowel-gas pattern. Confluent right lung base opacity persists, but appears related to elevated right hemidiaphragm on CT Abdomen and Pelvis in 2023. Chronic staple line of bowel in the right abdomen. No acute finding. IMPRESSION: Nonobstructed bowel-gas pattern. Percutaneous gastrostomy tube in place. Electronically Signed   By: VEAR Hurst M.D.   On: 12/20/2023 09:17   DG Chest Port 1 View Result Date: 12/18/2023 EXAM: 1 VIEW(S) XRAY OF THE CHEST 12/18/2023 05:22:56 PM COMPARISON: 10/09/2023 CLINICAL HISTORY: Shortness of breath 10026. SOB FINDINGS: LINES, TUBES AND DEVICES: Tracheostomy tube in place. Metallic device in left hilum. Surgical clips in right axilla. LUNGS AND PLEURA: Elevated right hemidiaphragm. Small bilateral pleural effusion. Persistent bilateral interstitial prominence, similar to prior exam. No pneumothorax. HEART AND MEDIASTINUM: No acute abnormality of the cardiac and mediastinal silhouettes. BONES AND SOFT TISSUES: No acute osseous abnormality. IMPRESSION: 1. Persistent bilateral interstitial prominence, similar to prior exam and favoring edema. 2. Small bilateral pleural effusions.  Electronically signed by: Norman Gatlin MD 12/18/2023 05:26 PM EDT RP Workstation: [id number]    Microbiology: Results for orders placed or performed during the hospital encounter of 12/18/23  Blood Culture (routine x 2)     Status: None   Collection Time: 12/18/23  4:39 PM   Specimen: BLOOD  Result Value Ref Range Status   Specimen Description BLOOD LEFT ANTECUBITAL  Final   Special Requests   Final    BOTTLES DRAWN AEROBIC AND ANAEROBIC Blood Culture results may not be optimal due to an inadequate volume of blood received in culture bottles   Culture   Final    NO GROWTH 5 DAYS Performed at [facility], [address] [place], [place] [address]    Report Status 12/23/2023 FINAL  Final  Resp panel by RT-PCR (RSV, Flu A&B, Covid) Anterior Nasal Swab     Status: None   Collection Time: 12/18/23  5:15 PM   Specimen: Anterior Nasal Swab  Result Value Ref Range Status   SARS Coronavirus 2 by RT PCR NEGATIVE NEGATIVE Final    Comment: (NOTE) SARS-CoV-2 target nucleic acids  are NOT DETECTED.  The SARS-CoV-2 RNA is generally detectable in upper respiratory specimens during the acute phase of infection. The lowest concentration of SARS-CoV-2 viral copies this assay can detect is 138 copies/mL. A negative result does not preclude SARS-Cov-2 infection and should not be used as the sole basis for treatment or other patient management decisions. A negative result may occur with  improper specimen collection/handling, submission of specimen other than nasopharyngeal swab, presence of viral mutation(s) within the areas targeted by this assay, and inadequate number of viral copies(<138 copies/mL). A negative result must be combined with clinical observations, patient history, and epidemiological information. The expected result is Negative.  Fact Sheet for Patients:  bloggercourse.com  Fact Sheet for Healthcare Providers:   seriousbroker.it  This test is no t yet approved or cleared by the [place]  FDA and  has been authorized for detection and/or diagnosis of SARS-CoV-2 by FDA under an Emergency Use Authorization (EUA). This EUA will remain  in effect (meaning this test can be used) for the duration of the COVID-19 declaration under Section 564(b)(1) of the Act, 21 U.S.C.section 360bbb-3(b)(1), unless the authorization is terminated  or revoked sooner.       Influenza A by PCR NEGATIVE NEGATIVE Final   Influenza B by PCR NEGATIVE NEGATIVE Final    Comment: (NOTE) The Xpert Xpress SARS-CoV-2/FLU/RSV plus assay is intended as an aid in the diagnosis of influenza from Nasopharyngeal swab specimens and should not be used as a sole basis for treatment. Nasal washings and aspirates are unacceptable for Xpert Xpress SARS-CoV-2/FLU/RSV testing.  Fact Sheet for Patients: bloggercourse.com  Fact Sheet for Healthcare Providers: seriousbroker.it  This test is not yet approved or cleared by the [place]  FDA and has been authorized for detection and/or diagnosis of SARS-CoV-2 by FDA under an Emergency Use Authorization (EUA). This EUA will remain in effect (meaning this test can be used) for the duration of the COVID-19 declaration under Section 564(b)(1) of the Act, 21 U.S.C. section 360bbb-3(b)(1), unless the authorization is terminated or revoked.     Resp Syncytial Virus by PCR NEGATIVE NEGATIVE Final    Comment: (NOTE) Fact Sheet for Patients: bloggercourse.com  Fact Sheet for Healthcare Providers: seriousbroker.it  This test is not yet approved or cleared by the [place]  FDA and has been authorized for detection and/or diagnosis of SARS-CoV-2 by FDA under an Emergency Use Authorization (EUA). This EUA will remain in effect (meaning this test can be used) for  the duration of the COVID-19 declaration under Section 564(b)(1) of the Act, 21 U.S.C. section 360bbb-3(b)(1), unless the authorization is terminated or revoked.  Performed at [facility], [address] [place], [place] [address]   Blood Culture (routine x 2)     Status: None   Collection Time: 12/18/23  6:01 PM   Specimen: BLOOD  Result Value Ref Range Status   Specimen Description BLOOD BLOOD LEFT ARM  Final   Special Requests   Final    BOTTLES DRAWN AEROBIC AND ANAEROBIC Blood Culture results may not be optimal due to an inadequate volume of blood received in culture bottles   Culture   Final    NO GROWTH 5 DAYS Performed at [facility], [address] [place], [place] [address]    Report Status 12/23/2023 FINAL  Final    Labs: CBC: Recent Labs  Lab 12/18/23 1647 12/19/23 0435 12/21/23 0520 12/22/23 0455 12/23/23 0540  WBC 17.3* 9.4 14.4* 12.2* 13.4*  NEUTROABS  --   --  10.4* 8.1* 10.0*  HGB 14.9 12.7 14.4 14.9 13.7  HCT 45.9 38.7 42.6 45.5 41.9  MCV 86.4 86.8 83.5 85.0 85.9  PLT 224 164 228 231 222   Basic Metabolic Panel: Recent Labs  Lab 12/19/23 0435 12/20/23 1101 12/21/23 0520 12/22/23 0455 12/23/23 0540  NA 134* 138 139 141 138  K 4.8 4.1 3.4* 3.4* 4.3  CL 100 98 96* 100 104  CO2 22 25 27 26  21*  GLUCOSE 359* 139* 135* 200* 246*  BUN 26* 31* 46* 56* 53*  CREATININE 1.09* 0.83 1.03* 1.45* 1.24*  CALCIUM  8.8* 9.3 10.3 9.6 9.4  MG  --   --   --  2.4  --    Liver Function Tests: Recent Labs  Lab 12/18/23 1647  AST 90*  ALT 44  ALKPHOS 85  BILITOT 1.0  PROT 8.9*  ALBUMIN  3.6   CBG: Recent Labs  Lab 12/22/23 1113 12/22/23 1603 12/22/23 2119 12/23/23 0735 12/23/23 1129  GLUCAP 202* 225* 314* 214* 371*    Discharge time spent:  34 minutes.  Signed: Drue ONEIDA Potter, MD [facility] 12/23/2023

## 2024-01-21 ENCOUNTER — Encounter: Payer: Self-pay | Admitting: Internal Medicine

## 2024-03-11 ENCOUNTER — Emergency Department

## 2024-03-11 ENCOUNTER — Emergency Department
Admission: EM | Admit: 2024-03-11 | Discharge: 2024-03-11 | Disposition: A | Discharge status: Home / Self Care | Attending: Emergency Medicine | Admitting: Emergency Medicine

## 2024-03-11 DIAGNOSIS — N189 Chronic kidney disease, unspecified: Secondary | ICD-10-CM | POA: Diagnosis not present

## 2024-03-11 DIAGNOSIS — K9423 Gastrostomy malfunction: Secondary | ICD-10-CM | POA: Insufficient documentation

## 2024-03-11 DIAGNOSIS — E119 Type 2 diabetes mellitus without complications: Secondary | ICD-10-CM | POA: Insufficient documentation

## 2024-03-11 DIAGNOSIS — I129 Hypertensive chronic kidney disease with stage 1 through stage 4 chronic kidney disease, or unspecified chronic kidney disease: Secondary | ICD-10-CM | POA: Diagnosis not present

## 2024-03-11 MED ORDER — LIDOCAINE HCL URETHRAL/MUCOSAL 2 % EX GEL
1.0000 | Freq: Once | CUTANEOUS | Status: AC
  Administered 2024-03-11: 1 via TOPICAL

## 2024-03-11 MED ORDER — DIATRIZOATE MEGLUMINE & SODIUM 66-10 % PO SOLN
30.0000 mL | Freq: Once | ORAL | Status: AC
  Administered 2024-03-11: 30 mL

## 2024-03-11 NOTE — ED Provider Notes (Signed)
 "  [facility] Provider Note    Event Date/Time   First MD Initiated Contact with Patient 03/11/24 1550     (approximate)   History   Chief Complaint feeding tube problem   HPI  Angela David is a 84 y.o. female with past medical history of hypertension, diabetes, CKD, pulm hypertension, pulmonary fibrosis, vocal cord dysfunction status post tracheostomy and PEG tube who presents to the ED complaining of feeding tube problem.  Patient reports that earlier today her feeding tube fell out and the balloon appears to be ruptured.  She has had leakage of stomach contents from the gastrostomy site, denies associated pain.  She does state that she has had longstanding issues with leakage from around her gastrostomy tube, typically has a 26 French tube.     Physical Exam   Triage Vital Signs: ED Triage Vitals  Encounter Vitals Group     BP 03/11/24 1447 (!) 127/50     Girls Systolic BP Percentile --      Girls Diastolic BP Percentile --      Boys Systolic BP Percentile --      Boys Diastolic BP Percentile --      Pulse Rate 03/11/24 1447 84     Resp 03/11/24 1447 18     Temp 03/11/24 1447 99.1 F (37.3 C)     Temp Source 03/11/24 1447 Oral     SpO2 03/11/24 1447 92 %     Weight 03/11/24 1448 150 lb (68 kg)     Height 03/11/24 1448 5' 5 (1.651 m)     Head Circumference --      Peak Flow --      Pain Score 03/11/24 1448 0     Pain Loc --      Pain Education --      Exclude from Growth Chart --     Most recent vital signs: Vitals:   03/11/24 1447 03/11/24 1554  BP: (!) 127/50   Pulse: 84   Resp: 18   Temp: 99.1 F (37.3 C)   SpO2: 92% 100%    Constitutional: Alert and oriented. Eyes: Conjunctivae are normal. Head: Atraumatic. Nose: No congestion/rhinnorhea. Mouth/Throat: Mucous membranes are moist.  Cardiovascular: Normal rate, regular rhythm. Grossly normal heart sounds.  2+ radial pulses bilaterally. Respiratory: Normal respiratory  effort.  No retractions. Lungs CTAB. Gastrointestinal: Soft and nontender. No distention.  Gastrostomy site leaking stomach contents. Musculoskeletal: No lower extremity tenderness nor edema.  Neurologic:  Normal speech and language. No gross focal neurologic deficits are appreciated.    ED Results / Procedures / Treatments   Labs (all labs ordered are listed, but only abnormal results are displayed) Labs Reviewed - No data to display   RADIOLOGY Abdominal x-ray reviewed and interpreted by me with contrast in the stomach indicating appropriate position of gastrostomy tube.  PROCEDURES:  Critical Care performed: No  Procedures   MEDICATIONS ORDERED IN ED: Medications  lidocaine  (XYLOCAINE ) 2 % jelly 1 Application (1 Application Topical Given 03/11/24 1631)  diatrizoate  meglumine -sodium (GASTROGRAFIN ) 66-10 % solution 30 mL (30 mLs Per Tube Given 03/11/24 1711)     IMPRESSION / MDM / ASSESSMENT AND PLAN / ED COURSE  I reviewed the triage vital signs and the nursing notes.                              84 y.o. female with past medical history of hypertension,  diabetes, pulmonary fibrosis, pulmonary hypertension, vocal cord dysfunction status post tracheostomy and PEG tube who presents to the ED complaining of displaced PEG tube.  Patient's presentation is most consistent with acute complicated illness / injury requiring diagnostic workup.  Differential diagnosis includes, but is not limited to, displaced PEG tube, leaking PEG tube, PEG site infection.  Patient well-appearing and in no acute distress, vital signs are unremarkable.  Patient has large size 26 French PEG tube that has become displaced after the balloon has ruptured.  Unfortunately, the largest gastrostomy tube we were able to find in the hospital is 71 French even after calling the operating room.  Patient has had longstanding issues with leaking from around her gastrostomy site, was advised that leakage may be worse  with smaller tube size.  Tube was replaced without difficulty, will check positioning with abdominal x-ray with contrast.  Gastrostomy appropriately positioned on x-ray with contrast, patient appropriate for discharge with outpatient follow-up.  It does appear she has previously seen a careers adviser at [facility] for hernia associated with gastrostomy site, was encouraged to follow-up and return to the ED for new or worsening symptoms.  Patient agrees with plan.      FINAL CLINICAL IMPRESSION(S) / ED DIAGNOSES   Final diagnoses:  Gastrostomy malfunction (HCC)     Rx / DC Orders   ED Discharge Orders     None        Note:  This document was prepared using Dragon voice recognition software and may include unintentional dictation errors.   Willo Dunnings, MD 03/11/24 1723  "

## 2024-03-11 NOTE — ED Triage Notes (Signed)
 Pt present sot the ED via POV from home. Pt reports that her feeding tube came out today. Denies other complaints or concerns. Pt receives all food and medications through her feeding tube.

## 2024-03-11 NOTE — ED Notes (Signed)
 Pt lives at home and cared for by her husband. Pt reports feeding tube has come all the way out. Hole bandaged by husband.

## 2024-03-11 NOTE — ED Provider Triage Note (Signed)
 Emergency Medicine Provider Triage Evaluation Note  Kristy Schomburg , a 84 y.o. female  was evaluated in triage.  Pt complains of feeding tube came out today.  Review of Systems  Positive:  Negative:   Physical Exam  There were no vitals taken for this visit. Gen:   Awake, no distress   Resp:  Normal effort  MSK:   Moves extremities without difficulty  Other:    Medical Decision Making  Medically screening exam initiated at 2:47 PM.  Appropriate orders placed.  Erminio Philippa Abbot was informed that the remainder of the evaluation will be completed by another provider, this initial triage assessment does not replace that evaluation, and the importance of remaining in the ED until their evaluation is complete.     Saunders Shona CROME, PA-C 03/11/24 1448

## 2024-03-26 ENCOUNTER — Observation Stay
Admission: EM | Admit: 2024-03-26 | Discharge: 2024-03-30 | Disposition: A | Attending: Internal Medicine | Admitting: Internal Medicine

## 2024-03-26 ENCOUNTER — Other Ambulatory Visit: Payer: Self-pay

## 2024-03-26 DIAGNOSIS — Z79899 Other long term (current) drug therapy: Secondary | ICD-10-CM | POA: Insufficient documentation

## 2024-03-26 DIAGNOSIS — K9423 Gastrostomy malfunction: Principal | ICD-10-CM | POA: Insufficient documentation

## 2024-03-26 DIAGNOSIS — K316 Fistula of stomach and duodenum: Secondary | ICD-10-CM

## 2024-03-26 DIAGNOSIS — T85528A Displacement of other gastrointestinal prosthetic devices, implants and grafts, initial encounter: Secondary | ICD-10-CM | POA: Diagnosis not present

## 2024-03-26 DIAGNOSIS — E1121 Type 2 diabetes mellitus with diabetic nephropathy: Secondary | ICD-10-CM | POA: Insufficient documentation

## 2024-03-26 DIAGNOSIS — Z794 Long term (current) use of insulin: Secondary | ICD-10-CM | POA: Insufficient documentation

## 2024-03-26 DIAGNOSIS — N179 Acute kidney failure, unspecified: Secondary | ICD-10-CM | POA: Insufficient documentation

## 2024-03-26 DIAGNOSIS — J9611 Chronic respiratory failure with hypoxia: Secondary | ICD-10-CM | POA: Insufficient documentation

## 2024-03-26 DIAGNOSIS — J8410 Pulmonary fibrosis, unspecified: Secondary | ICD-10-CM | POA: Insufficient documentation

## 2024-03-26 DIAGNOSIS — I270 Primary pulmonary hypertension: Secondary | ICD-10-CM | POA: Insufficient documentation

## 2024-03-26 DIAGNOSIS — Z853 Personal history of malignant neoplasm of breast: Secondary | ICD-10-CM | POA: Insufficient documentation

## 2024-03-26 DIAGNOSIS — E785 Hyperlipidemia, unspecified: Secondary | ICD-10-CM | POA: Insufficient documentation

## 2024-03-26 DIAGNOSIS — R1310 Dysphagia, unspecified: Secondary | ICD-10-CM

## 2024-03-26 DIAGNOSIS — F418 Other specified anxiety disorders: Secondary | ICD-10-CM | POA: Insufficient documentation

## 2024-03-26 DIAGNOSIS — I5032 Chronic diastolic (congestive) heart failure: Secondary | ICD-10-CM | POA: Insufficient documentation

## 2024-03-26 LAB — CBC WITH DIFFERENTIAL/PLATELET
Abs Immature Granulocytes: 0.11 10*3/uL — ABNORMAL HIGH (ref 0.00–0.07)
Basophils Absolute: 0.1 10*3/uL (ref 0.0–0.1)
Basophils Relative: 1 %
Eosinophils Absolute: 0.2 10*3/uL (ref 0.0–0.5)
Eosinophils Relative: 1 %
HCT: 43 % (ref 36.0–46.0)
Hemoglobin: 13.8 g/dL (ref 12.0–15.0)
Immature Granulocytes: 1 %
Lymphocytes Relative: 20 %
Lymphs Abs: 2.5 10*3/uL (ref 0.7–4.0)
MCH: 27.6 pg (ref 26.0–34.0)
MCHC: 32.1 g/dL (ref 30.0–36.0)
MCV: 86 fL (ref 80.0–100.0)
Monocytes Absolute: 0.9 10*3/uL (ref 0.1–1.0)
Monocytes Relative: 7 %
Neutro Abs: 8.8 10*3/uL — ABNORMAL HIGH (ref 1.7–7.7)
Neutrophils Relative %: 70 %
Platelets: 214 10*3/uL (ref 150–400)
RBC: 5 MIL/uL (ref 3.87–5.11)
RDW: 12.8 % (ref 11.5–15.5)
WBC: 12.5 10*3/uL — ABNORMAL HIGH (ref 4.0–10.5)
nRBC: 0 % (ref 0.0–0.2)

## 2024-03-26 LAB — BASIC METABOLIC PANEL WITH GFR
Anion gap: 13 (ref 5–15)
BUN: 21 mg/dL (ref 8–23)
CO2: 23 mmol/L (ref 22–32)
Calcium: 9.5 mg/dL (ref 8.9–10.3)
Chloride: 101 mmol/L (ref 98–111)
Creatinine, Ser: 0.85 mg/dL (ref 0.44–1.00)
GFR, Estimated: >60 mL/min
Glucose, Bld: 137 mg/dL — ABNORMAL HIGH (ref 70–99)
Potassium: 4.9 mmol/L (ref 3.5–5.1)
Sodium: 137 mmol/L (ref 135–145)

## 2024-03-26 MED ORDER — ONDANSETRON HCL 4 MG PO TABS: 4.0000 mg | ORAL_TABLET | Freq: Four times a day (QID) | ORAL | Status: DC | PRN

## 2024-03-26 MED ORDER — INSULIN ASPART 100 UNIT/ML IJ SOLN
0.0000 [IU] | INTRAMUSCULAR | Status: DC
  Administered 2024-03-27 (×2): 2 [IU] via SUBCUTANEOUS
  Administered 2024-03-27: 1 [IU] via SUBCUTANEOUS
  Administered 2024-03-27: 2 [IU] via SUBCUTANEOUS
  Administered 2024-03-27: 1 [IU] via SUBCUTANEOUS
  Administered 2024-03-27: 2 [IU] via SUBCUTANEOUS
  Administered 2024-03-28 (×2): 3 [IU] via SUBCUTANEOUS
  Administered 2024-03-28 (×2): 2 [IU] via SUBCUTANEOUS
  Administered 2024-03-28 (×2): 3 [IU] via SUBCUTANEOUS
  Administered 2024-03-28: 2 [IU] via SUBCUTANEOUS
  Administered 2024-03-29: 3 [IU] via SUBCUTANEOUS
  Administered 2024-03-29: 5 [IU] via SUBCUTANEOUS
  Administered 2024-03-29: 3 [IU] via SUBCUTANEOUS
  Filled 2024-03-26: qty 2
  Filled 2024-03-26: qty 5
  Filled 2024-03-26: qty 2
  Filled 2024-03-26 (×2): qty 3
  Filled 2024-03-26: qty 1
  Filled 2024-03-26 (×2): qty 3
  Filled 2024-03-26 (×3): qty 2
  Filled 2024-03-26: qty 3
  Filled 2024-03-26: qty 1
  Filled 2024-03-26: qty 2
  Filled 2024-03-26: qty 3

## 2024-03-26 MED ORDER — ACETAMINOPHEN 325 MG PO TABS
650.0000 mg | ORAL_TABLET | Freq: Four times a day (QID) | ORAL | Status: DC | PRN
  Administered 2024-03-29: 650 mg via ORAL
  Filled 2024-03-26: qty 2

## 2024-03-26 MED ORDER — ZINC OXIDE 20 % EX OINT: 1.0000 | TOPICAL_OINTMENT | CUTANEOUS | Status: DC | PRN

## 2024-03-26 MED ORDER — SODIUM CHLORIDE 3 % IN NEBU
4.0000 mL | INHALATION_SOLUTION | Freq: Two times a day (BID) | RESPIRATORY_TRACT | Status: AC
  Administered 2024-03-29: 4 mL via RESPIRATORY_TRACT
  Filled 2024-03-26 (×6): qty 4

## 2024-03-26 MED ORDER — IPRATROPIUM-ALBUTEROL 0.5-2.5 (3) MG/3ML IN SOLN: 3.0000 mL | Freq: Four times a day (QID) | RESPIRATORY_TRACT | Status: DC | PRN

## 2024-03-26 MED ORDER — ENOXAPARIN SODIUM 40 MG/0.4ML IJ SOSY
40.0000 mg | PREFILLED_SYRINGE | INTRAMUSCULAR | Status: DC
  Administered 2024-03-27: 40 mg via SUBCUTANEOUS
  Filled 2024-03-26: qty 0.4

## 2024-03-26 MED ORDER — POLYVINYL ALCOHOL 1.4 % OP SOLN: 1.0000 [drp] | Freq: Two times a day (BID) | OPHTHALMIC | Status: DC | PRN

## 2024-03-26 MED ORDER — ACETAMINOPHEN 650 MG RE SUPP: 650.0000 mg | Freq: Four times a day (QID) | RECTAL | Status: DC | PRN

## 2024-03-26 MED ORDER — ONDANSETRON HCL 4 MG/2ML IJ SOLN: 4.0000 mg | Freq: Four times a day (QID) | INTRAMUSCULAR | Status: DC | PRN

## 2024-03-26 NOTE — ED Provider Notes (Signed)
 "  [facility] Provider Note    Event Date/Time   First MD Initiated Contact with Patient 03/26/24 1802     (approximate)   History   feeding tube   HPI  Angela David is a 84 y.o. 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  The patient has had longstanding issues with leakage around her tube site and and recently had is large is a 95 French tube.  This was replaced with a 38 French a couple of weeks ago.  The patient also reports a lot of pain and irritation around the tube site.  I reviewed the past medical records.  The patient was seen in the ED on 1/14 due to the gastrostomy tube falling out.  She had a 31 French tube previously, and it was replaced with a 41 French which was the largest size available at that time. Previously she was admitted to the hospitalist service last October with acute on chronic respiratory failure.  Physical Exam   Triage Vital Signs: ED Triage Vitals [03/26/24 1551]  Encounter Vitals Group     BP (!) 152/123     Girls Systolic BP Percentile      Girls Diastolic BP Percentile      Boys Systolic BP Percentile      Boys Diastolic BP Percentile      Pulse Rate 83     Resp 18     Temp 98.5 F (36.9 C)     Temp src      SpO2 92 %     Weight 150 lb (68 kg)     Height 5' 5 (1.651 m)     Head Circumference      Peak Flow      Pain Score 0     Pain Loc      Pain Education      Exclude from Growth Chart     Most recent vital signs: Vitals:   03/26/24 1551 03/26/24 2034  BP: (!) 152/123 117/80  Pulse: 83 81  Resp: 18 20  Temp: 98.5 F (36.9 C)   SpO2: 92% 99%     General: Awake, no distress.  CV:  Good peripheral perfusion.  Resp:  Normal effort.  Abd:  Soft no focal tenderness.  No distention. Other:  Gastrostomy tube site with active leakage of  solid food-like material and clear liquid.  No bleeding.  No significant surrounding erythema or induration.  ED Results / Procedures / Treatments   Labs (all labs ordered are listed, but only abnormal results are displayed) Labs Reviewed  BASIC METABOLIC PANEL WITH GFR - Abnormal; Notable for the following components:      Result Value   Glucose, Bld 137 (*)    All other components within normal limits  CBC WITH DIFFERENTIAL/PLATELET - Abnormal; Notable for the following components:   WBC 12.5 (*)    Neutro Abs 8.8 (*)    Abs Immature Granulocytes 0.11 (*)    All other components within normal limits  CREATININE, SERUM     EKG    RADIOLOGY    PROCEDURES:  Critical Care performed: No  Procedures   MEDICATIONS ORDERED IN ED: Medications  ipratropium-albuterol  (DUONEB) 0.5-2.5 (3) MG/3ML nebulizer solution 3 mL (has no administration in time range)  sodium chloride  HYPERTONIC 3 % nebulizer solution 4 mL (has  no administration in time range)  artificial tears ophthalmic solution 1 drop (has no administration in time range)  zinc  oxide 20 % ointment 1 Application (has no administration in time range)  enoxaparin  (LOVENOX ) injection 40 mg (has no administration in time range)  acetaminophen  (TYLENOL ) tablet 650 mg (has no administration in time range)    Or  acetaminophen  (TYLENOL ) suppository 650 mg (has no administration in time range)  ondansetron  (ZOFRAN ) tablet 4 mg (has no administration in time range)    Or  ondansetron  (ZOFRAN ) injection 4 mg (has no administration in time range)  insulin  aspart (novoLOG ) injection 0-9 Units (has no administration in time range)     IMPRESSION / MDM / ASSESSMENT AND PLAN / ED COURSE  I reviewed the triage vital signs and the nursing notes.  84 year old female with PMH as noted above presents with  a dislodged PEG tube.  She has had increased leakage around the tube site and has frequently had the tubes follow-up due to the  tract becoming quite large.  On exam the opening is close to 3 cm.  Differential diagnosis includes, but is not limited to, dislodged gastrostomy tube.  Since we do not have any large tubes available in the ED, I not sure that placing a smaller tube will be useful at this time.  I will discuss with surgery and/or GI about further management including possible admission for IR tube placement.  Patient's presentation is most consistent with severe exacerbation of chronic illness.  ----------------------------------------- 9:54 PM on 03/26/2024 -----------------------------------------  I consulted and discussed the case with Dr. Desiderio from general surgery who advised that it might be possible to close the current opening and changing to a J-tube.  However, he recommended talking to IR as well.  I consulted Dr. Karalee from IR who is familiar with the patient.  He agreed that it would not be helpful to insert a smaller tube at this time.  He recommended admitting her to the hospitalist tonight, placing a colostomy bag over the tract in order to collect the secretions, and that his team would see the patient in the morning and then discuss with surgery to figure out a plan.  Basic labs were obtained.  BMP and CBC are unremarkable.  I consulted Dr. Cleatus from the hospitalist service; based on our discussion she agrees to evaluate the patient for admission.  FINAL CLINICAL IMPRESSION(S) / ED DIAGNOSES   Final diagnoses:  Gastrostomy tube dysfunction (HCC)     Rx / DC Orders   ED Discharge Orders     None        Note:  This document was prepared using Dragon voice recognition software and may include unintentional dictation errors.    Jacolyn Pae, MD 03/26/24 2316  "

## 2024-03-26 NOTE — ED Notes (Signed)
 Under recommendation of IR per attending, place barrier cream around g tube ostomy.   Barrier cream applied and a colostomy bag is applied due to excess secretions from ostomy.

## 2024-03-26 NOTE — ED Triage Notes (Signed)
 Pt comes with feeding tube issue. Family reports he noticed it was out today.

## 2024-03-26 NOTE — Assessment & Plan Note (Addendum)
 IR and surgery consulted for definitive management Continue with colostomy bag over G-tube site as recommended by IR Ostomy care Skin barrier protection N.p.o., SCD for DVT prophylaxis for likely procedure in the a.m. IV hydration as needed Aspiration precautions

## 2024-03-27 ENCOUNTER — Observation Stay

## 2024-03-27 DIAGNOSIS — K316 Fistula of stomach and duodenum: Secondary | ICD-10-CM | POA: Diagnosis not present

## 2024-03-27 DIAGNOSIS — K9423 Gastrostomy malfunction: Secondary | ICD-10-CM | POA: Diagnosis not present

## 2024-03-27 LAB — CBG MONITORING, ED
Glucose-Capillary: 125 mg/dL — ABNORMAL HIGH (ref 70–99)
Glucose-Capillary: 150 mg/dL — ABNORMAL HIGH (ref 70–99)
Glucose-Capillary: 158 mg/dL — ABNORMAL HIGH (ref 70–99)
Glucose-Capillary: 164 mg/dL — ABNORMAL HIGH (ref 70–99)
Glucose-Capillary: 165 mg/dL — ABNORMAL HIGH (ref 70–99)
Glucose-Capillary: 186 mg/dL — ABNORMAL HIGH (ref 70–99)

## 2024-03-27 MED ORDER — KATE FARMS STANDARD 1.4 PO LIQD
325.0000 mL | Freq: Three times a day (TID) | ORAL | Status: DC
  Administered 2024-03-27 – 2024-03-30 (×7): 325 mL via ORAL
  Filled 2024-03-27: qty 325

## 2024-03-27 NOTE — ED Notes (Signed)
Fall bundle in place

## 2024-03-27 NOTE — Progress Notes (Signed)
 PT Cancellation Note  Patient Details Name: Alanie Syler MRN: [id number] DOB: 11/28/1940   Cancelled Treatment:    Reason Eval/Treat Not Completed: Fatigue/lethargy limiting ability to participate Patient very politely declines PT today. She reports she has not had anything to eat since G tube came out and is not feeling good, very weak. Wants some food prior to mobility. Will re-attempt tomorrow.    Judge Duque 03/27/2024, 1:26 PM

## 2024-03-27 NOTE — Progress Notes (Signed)
 Pt refusing SCD's at this time. States she understands their use but wants to wait for now.

## 2024-03-27 NOTE — Progress Notes (Signed)
 " PROGRESS NOTE  Angela David  DOB: 02/19/1941  PCP: [name], MD FMW:[id number]  DOA: 03/26/2024  LOS: 0 days  Hospital Day: 2  Subjective: Patient was seen and examined this morning. Pleasant elderly Caucasian female.  Propped up in bed.  Husband at bedside. Patient not in distress.  She has a tracheostomy tube, not attached to oxygen. Can talk blocking the tracheostomy tube. Pending general surgery and IR evaluation  Brief narrative: Angela David is a 84 y.o. female with PMH significant for pulmonary fibrosis/pulmonary hypertension with chronic respiratory failure s/p tracheostomy on nocturnal ventilation, dysphagia (s/p of PEG), breast cancer s/p lumpectomy/chemoradiation, IDDM,  dCHF, CKD stage IIIa. Lives at home.  1/29, patient presented to the ED with complaint of recurrent G-tube dislodgment. Patient has a large ostomy and has had upsizing of her G-tube in the past.  But she continues to have ongoing problems with leakage and recurrent dislodgment. She was seen in the ED on 1/14 after her 37 French tube fell out and was replaced in the ED.  The tube fell out again on 1/29 and hence brought to the ED.    In the ED, afebrile, hemodynamically stable on arrival, vitals within normal limits. Labs notable for WBC 12.5  EDP discussed the case with general surgery Dr. Desiderio who advised that it might be possible to close the current opening and change it to a J-tube.  This was discussed with IR Dr. Karalee.  Colostomy bag was placed over the G-tube tract to collect the secretions. Recommended admission to [facility] To be seen by IR this morning.  Assessment and plan: Recurrent G-tube dislodgment  G-tube status for chronic dysphagia IR and surgery consulted for definitive management Continue with colostomy bag over G-tube site as recommended by IR Ostomy care Skin barrier protection N.p.o., Oral meds on hold IV hydration as needed Aspiration  precautions Continue Protonix     Pulmonary fibrosis  / pulmonary hypertension  Chronic respiratory failure with hypoxia Tracheostomy status No acute issues Continue as needed nebulization Respiratory following for trach care and nighttime ventilator    Chronic diastolic CHF Pulmonary hypertension Clinically euvolemic PTA meds- metoprolol  tartrate 25 mg daily Unable to be give oral meds at this time  Type 2 diabetes mellitus Diabetic neuropathy A1c 7.6 on 09/2023 PTA meds-Toujeo  20 units daily Currently only on SSI/Accu-Cheks every 4 hours while NPO Continue monitor blood sugar level.  Once able to restart tube feeding, may need long-acting insulin  as well.   Continue gabapentin  for neuropathy Recent Labs  Lab 03/27/24 0007 03/27/24 0610 03/27/24 0736  GLUCAP 125* 186* 164*   HLD Zetia   Depression with anxiety PTA meds- bupropion  150 mg twice daily, Lexapro  20 mg daily, Requip  0.5 mg daily, melatonin  H/o breast cancer s/p lumpectomy/chemoradiation No acute issues suspected   Nutrition Status:         Mobility: Encourage ambulation.  PT eval requested   PT Orders:   PT Follow up Rec:     Goals of care   Code Status: Full Code     DVT prophylaxis:  Place and maintain sequential compression device Start: 03/27/24 1042   Antimicrobials: None Fluid: None Consultants: Surgery, IR Family Communication: Husband at bedside  Status: Observation Level of care:  Med-Surg   Patient is from: Home Needs to continue in-hospital care: Pending PEG tube plan Anticipated d/c to: Hopefully home ultimately      Diet:  Diet Order     None  Scheduled Meds:  insulin  aspart  0-9 Units Subcutaneous Q4H   sodium chloride  HYPERTONIC  4 mL Nebulization BID    PRN meds: acetaminophen  **OR** acetaminophen , artificial tears, ipratropium-albuterol , ondansetron  **OR** ondansetron  (ZOFRAN ) IV, zinc  oxide   Infusions:    Antimicrobials: Anti-infectives  (From admission, onward)    None       Objective: Vitals:   03/27/24 0655 03/27/24 0655  BP: (!) 126/56   Pulse: 96   Resp: 18   Temp:  99.3 F (37.4 C)  SpO2: 100%    No intake or output data in the 24 hours ending 03/27/24 1043 Filed Weights   03/26/24 1551  Weight: 68 kg   Weight change:  Body mass index is 24.96 kg/m.   Physical Exam: General exam: Pleasant, elderly Caucasian female.  Not in distress Skin: No rashes, lesions or ulcers. HEENT: Atraumatic, normocephalic, no obvious bleeding.  Has tracheostomy tube anteriorly Lungs: Clear to auscultation bilaterally,  CVS: S1, S2, no murmur,   GI/Abd: Soft, nontender, nondistended, bowel sound present, PEG tube opening anteriorly with a bag collecting the secretion CNS: Alert, awake, oriented x 3.  Communicates with moving her lips or blocking the trach tube Psychiatry: Mood appropriate Extremities: No pedal edema, no calf tenderness,   Data Review: I have personally reviewed the laboratory data and studies available.  F/u labs ordered Unresulted Labs (From admission, onward)    None       Signed, Chapman Rota, MD [facility] 03/27/2024  "

## 2024-03-27 NOTE — Progress Notes (Signed)
 After several attempts by patient, she notified me that now the [employer] [place] liquid is not being digested and going straight to her ostomy pouch as well. Dr. Arlice notified. Pt aware that the next step now is Palliative conversations and a PICC line placement with TPN. Pt states she's not sure how long she will do that for, but she's willing to give it a try.

## 2024-03-27 NOTE — Consult Note (Signed)
" °  CLINICAL SUPPORT TEAM - WOUND OSTOMY AND CONTINENCE TEAM  CONSULTATION SERVICES   WOC Nurse-Inpatient Note  WOC Nurse ostomy consult note Stoma type/location: gastrostomy site; tube has been removed  Unfortunately patient's gtube has been increase to 27F and continues to fall out and the patient has issue with leakage, stabilization instead upsizing may be a better option if new tube is placed.  Peristomal assessment:  redness from gastric contents, viewed in images previously, no new images Treatment options for stomal/peristomal skin: can use barrier ointment if pouching is not successful  Output gastric contents both liquid and solids Ostomy pouching: 2pc. 2 3/4 high output pouch can be connected to BSD (Foley) to facilitate keeping the pouch empty at all times and lessen the likelihood of undermining the skin barrier.  It is challenging to encourage liquid effluent to empty into a pouch when it is a opening and not a true stoma.  If pouching continues to leak, would switch to gauze dressing changes, frequently with zinc  based barrier to the peristomal skin  Education provided: NA   Re consult if needed, will not follow at this time. Thanks  Torey Regan [employer], RN,CWOCN, CNS, MAINE [phone]   "

## 2024-03-27 NOTE — Progress Notes (Signed)
 Pt attempted to take a few bites of a pureed diet but told me that with each bite, the food would just come straight out of the ostomy so she is just going to stick with the [employer] liquid food for now.

## 2024-03-28 DIAGNOSIS — K9423 Gastrostomy malfunction: Secondary | ICD-10-CM | POA: Diagnosis not present

## 2024-03-28 DIAGNOSIS — K316 Fistula of stomach and duodenum: Secondary | ICD-10-CM | POA: Diagnosis not present

## 2024-03-28 LAB — CBG MONITORING, ED
Glucose-Capillary: 157 mg/dL — ABNORMAL HIGH (ref 70–99)
Glucose-Capillary: 159 mg/dL — ABNORMAL HIGH (ref 70–99)
Glucose-Capillary: 166 mg/dL — ABNORMAL HIGH (ref 70–99)
Glucose-Capillary: 214 mg/dL — ABNORMAL HIGH (ref 70–99)
Glucose-Capillary: 214 mg/dL — ABNORMAL HIGH (ref 70–99)
Glucose-Capillary: 222 mg/dL — ABNORMAL HIGH (ref 70–99)
Glucose-Capillary: 250 mg/dL — ABNORMAL HIGH (ref 70–99)

## 2024-03-28 MED ORDER — DIPHENHYDRAMINE HCL 25 MG PO CAPS
25.0000 mg | ORAL_CAPSULE | Freq: Once | ORAL | Status: AC
  Administered 2024-03-28: 25 mg via ORAL
  Filled 2024-03-28: qty 1

## 2024-03-28 MED ORDER — MELATONIN 5 MG PO TABS
5.0000 mg | ORAL_TABLET | Freq: Once | ORAL | Status: AC
  Administered 2024-03-28: 5 mg via ORAL
  Filled 2024-03-28: qty 1

## 2024-03-28 NOTE — Progress Notes (Signed)
 " PROGRESS NOTE  Angela David  DOB: 01/30/1941  PCP: [name], MD FMW:[id number]  DOA: 03/26/2024  LOS: 0 days  Hospital Day: 3  Subjective: Patient was seen and examined this morning. Pleasant elderly Caucasian female.  Propped up in bed Patient not in distress.  She has a tracheostomy tube, not attached to oxygen. Can talk blocking the tracheostomy tube. Seen by Surgery. Further discussion with Palliative regarding GOC for nutrition  Brief narrative: Angela David is a 84 y.o. female with PMH significant for pulmonary fibrosis/pulmonary hypertension with chronic respiratory failure s/p tracheostomy on nocturnal ventilation, dysphagia (s/p of PEG), breast cancer s/p lumpectomy/chemoradiation, IDDM,  dCHF, CKD stage IIIa. Lives at home.  1/29, patient presented to the ED with complaint of recurrent G-tube dislodgment. Patient has a large ostomy and has had upsizing of her G-tube in the past.  But she continues to have ongoing problems with leakage and recurrent dislodgment. She was seen in the ED on 1/14 after her 64 French tube fell out and was replaced in the ED.  The tube fell out again on 1/29 and hence brought to the ED.    EDP discussed the case with general surgery Dr. Desiderio who advised that it might be possible to close the current opening and change it to a J-tube.  This was discussed with IR Dr. Karalee.  Colostomy bag was placed over the G-tube tract to collect the secretions. Recommended admission to [facility]  Assessment and plan: Recurrent G-tube dislodgment  G-tube status for chronic dysphagia Complex history of multiple surgeries, colectomy for cancer, ventral hernia, G-tube placed.  Has gastrocutaneous fistula Only option would be major extensive surgery doing laparotomy with takedown of gastrocutaneous fistula abdominal wall reconstruction with hernia repair and placement of feeding jejunostomy, which she may not tolerate well Continue with  colostomy bag over G-tube site as recommended by IR Seen by WOC - ostomy care IV hydration as needed Aspiration precautions Continue Protonix  Refused TPN, does not want to be on hospice Palliative care following for GOC, appreciate recs    Pulmonary fibrosis  / pulmonary hypertension  Chronic respiratory failure with hypoxia Tracheostomy status No acute issues Continue as needed nebulization Respiratory following for trach care and nighttime ventilator    Chronic diastolic CHF Pulmonary hypertension Clinically euvolemic PTA meds- metoprolol  tartrate 25 mg daily Unable to be give oral meds at this time  Type 2 diabetes mellitus Diabetic neuropathy A1c 7.6 on 09/2023 PTA meds-Toujeo  20 units daily Currently only on SSI/Accu-Cheks every 4 hours while NPO Continue monitor blood sugar level.  Once able to restart tube feeding, may need long-acting insulin  as well.   Continue gabapentin  for neuropathy Recent Labs  Lab 03/27/24 1534 03/27/24 2035 03/28/24 0115 03/28/24 0442 03/28/24 0804  GLUCAP 150* 158* 157* 159* 166*   HLD Zetia   Depression with anxiety PTA meds- bupropion  150 mg twice daily, Lexapro  20 mg daily, Requip  0.5 mg daily, melatonin  H/o breast cancer s/p lumpectomy/chemoradiation No acute issues suspected   Nutrition Status:         Mobility: Encourage ambulation.  PT eval requested    Goals of care   Code Status: Full Code     DVT prophylaxis:  Place and maintain sequential compression device Start: 03/27/24 1042   Antimicrobials: None Fluid: None Consultants: Surgery, IR Family Communication: None, at the bedside  Status: Observation Level of care:  Stepdown   Patient is from: Home Needs to continue in-hospital care: Pending GOC Anticipated  d/c to: Hopefully home ultimately      Diet:  Diet Order             DIET - DYS 1 Room service appropriate? Yes; Fluid consistency: Thin  Diet effective now                    Scheduled Meds:  feeding supplement ([place] STANDARD 1.4) Liquid  325 mL Oral TID BM   insulin  aspart  0-9 Units Subcutaneous Q4H   sodium chloride  HYPERTONIC  4 mL Nebulization BID    PRN meds: acetaminophen  **OR** acetaminophen , artificial tears, ipratropium-albuterol , ondansetron  **OR** ondansetron  (ZOFRAN ) IV, zinc  oxide   Infusions:    Antimicrobials: Anti-infectives (From admission, onward)    None       Objective: Vitals:   03/28/24 0619 03/28/24 0843  BP: 131/61   Pulse: (!) 110   Resp: 16   Temp:  98.4 F (36.9 C)  SpO2: 100%    No intake or output data in the 24 hours ending 03/28/24 1101 Filed Weights   03/26/24 1551  Weight: 68 kg   Weight change:  Body mass index is 24.96 kg/m.   Physical Exam: General exam: Pleasant, elderly Caucasian female.  Not in distress Skin: No rashes, lesions or ulcers. HEENT: Atraumatic, normocephalic, no obvious bleeding.  Has tracheostomy tube anteriorly Lungs: Clear to auscultation bilaterally,  CVS: S1, S2, no murmur,   GI/Abd: Soft, nontender, nondistended, bowel sound present, PEG tube opening anteriorly with a bag collecting the secretion CNS: Alert, awake, oriented x 3.  Communicates with moving her lips or blocking the trach tube Psychiatry: Mood appropriate Extremities: No pedal edema, no calf tenderness,   Data Review: I have personally reviewed the laboratory data and studies available.  F/u labs ordered Unresulted Labs (From admission, onward)    None      Total time spent: 36 mins  Signed, Laree Lock, MD [facility] 03/28/2024  "

## 2024-03-28 NOTE — Progress Notes (Signed)
 CC: Gastrocutaneous fistula Subjective: No major issues when swallowing most of the intake goes to the ostomy bag from the fistula Refused TPN and PICC line  Objective: Vital signs in last 24 hours: Temp:  [98.1 F (36.7 C)-98.4 F (36.9 C)] 98.3 F (36.8 C) (01/31 1400) Pulse Rate:  [100-122] 122 (01/31 1800) Resp:  [16-18] 17 (01/31 1800) BP: (113-142)/(48-79) 113/73 (01/31 1800) SpO2:  [96 %-100 %] 96 % (01/31 1800) FiO2 (%):  [28 %-30 %] 28 % (01/31 0820)    Intake/Output from previous day: No intake/output data recorded. Intake/Output this shift: No intake/output data recorded.  Physical exam: She is chronically ill RESPIRATORY: trach in place Lungs are clear. There is normal respiratory effort, with equal breath sounds bilaterally, and without pathologic use of accessory muscles. CARDIOVASCULAR: Heart is regular without murmurs, gallops, or rubs. GI: The abdomen is  soft, there is an obvious gastrocutaneous fistula but no evidence of abscess no peritonitis no rebound.  There is an stoma appliance collecting stomach contents  GU: Rectal deferred.  SABRA   SKIN: Turgor is good and there are no pathologic skin lesions or ulcers. NEUROLOGIC: Motor and sensation is grossly normal. Cranial nerves are grossly intact. PSYCH:  Oriented to person, place and time. Affect is normal.  SHe is competent   Lab Results: CBC  Recent Labs    03/26/24 2031  WBC 12.5*  HGB 13.8  HCT 43.0  PLT 214   BMET Recent Labs    03/26/24 2031  NA 137  K 4.9  CL 101  CO2 23  GLUCOSE 137*  BUN 21  CREATININE 0.85  CALCIUM  9.5   PT/INR No results for input(s): LABPROT, INR in the last 72 hours. ABG No results for input(s): PHART, HCO3 in the last 72 hours.  Invalid input(s): PCO2, PO2  Studies/Results: No results found.  Anti-infectives: Anti-infectives (From admission, onward)    None       Assessment/Plan:  Angela David is a very pleasant 84 year old female with  complex abdominal history including multiple abdominal operations ranging from colectomy for cancer as well as ventral hernias and lastly a G-tube placed.  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  Patient with very complex issues. I suggest focusing on quality of life Provided supportive care and compassionate listening unFortunately not much I can add encouraged continue goals of care conversations We will be available I personally spent a total of 50 minutes in the care of the patient today including performing a medically appropriate exam/evaluation, counseling and educating, placing orders, referring and communicating with other health care professionals, documenting clinical information in the EHR, independently interpreting and reviewing images studies and coordinating care.    Darielle Hancher MD, FACS  03/28/2024

## 2024-03-28 NOTE — Consult Note (Signed)
 "                                   Consultation Note Date: 03/28/2024   Patient Name: Angela David  DOB: [birth year]  MRN: [id number]  Age / Sex: 84 y.o., female  PCP: [name], MD Referring Physician: Jerelene Critchley, MD  Reason for Consultation: Establishing goals of care   HPI/Brief Hospital Course: 84 y.o. female  with past medical history of pulmonary fibrosis, pulmonary HTN, s/p trach placement (2012) and PEG placement, chronic respiratory failure--requires mechanical ventilation at night via trach, no oxygen requirements during day, IDDM, CHF, CKD stage 3a and history of breast cancer s/p lumpectomy and chemo/radiation admitted from home on 03/26/2024 with recurrent G-tube dislodgement. Seen in ED earlier this month for same issue, tube replaced at that time and sent home. Back to ED due to recurrent dislodgement--general surgery and IR consulted.  Per general surgery, Ms. 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 Jordis spoke with Mr. Katayama regarding surgical interventions that would require an extensive abdominal surgery that would have high risk of morbidity--during discussions, Ms. Rayborn clear in her refusal of extensive surgical operation--concentration of quality of life   Palliative medicine was consulted for assisting with goals of care conversations.  Subjective:  Chart reviewed: Labs:Reviewed and trended since admission Vital signs:Reviewed from today Progress Notes:ED providers, primary team, general surgery and nursing notes reviewed since admission Advanced Directives:Documents not available in [place]  Visited with Ms. Valenti at her bedside. She is awake, alert and able to engage in conversation. Voice is soft but able to vocalize by covering trach site. No family or visitors at bedside during time of visit.  Introduced myself as a publishing rights manager as a member of the palliative care  team. Explained palliative medicine is specialized medical care for people living with serious illness. It focuses on providing relief from the symptoms and stress of a serious illness. The goal is to improve quality of life for both the patient and the family.   Ms. Mcglothen provided a brief life review. She and her husband have been married for over 50 years, they do not have any children. They have many nieces and nephews but they do not live local to the area. 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  At baseline, she shares she remains independent. Her and her husband enjoy socializing with their friends and going out to dinner.  Ms. Jergens shares with me she returned to the hospital due to G-tube dislodgement. She denies acute pain or discomfort or any GI symptoms. She is able to share conversation had with general surgery, intervention would require extensive surgical intervention for which she confirms she is not interested.  Per nursing notes from yesterday evening, PICC placement and TPN were offered. Ms. Roel shares after some thought she is not interested in pursuing TPN.  We discussed patient's current illness and what it means in the larger context of patient's on-going co-morbidities. Natural disease trajectory and expectations at EOL were discussed.   Ms. Mahurin becomes anxious when addressing goals of care and discussing end of life. Shared with Ms. Oneil at this time, PO intake is for pleasure, likely not providing her much nutrition and our bodies cannot sustain without proper nutrition. Ms. Demonbreun inquired about ways to provide her body with nutrition.  Discussed again options of surgical intervention versus consideration of TPN--risks of TPN also discussed. Ms. Ahart shares she is not interested in either of those options.  Briefly discussed comfort care/hospice care. Ms. Shively shares at this time she is not interested in having goals  of care conversations. Ms. Petz shares she is not ready to talk about dying. Honored Ms. Tamm's request.  Secure messages sent to primary team and well as general surgery expressing concern Ms. Tesar lacks a complete understanding of the severity of her illness and this being a terminal condition without intervention. Expressed concern related to initiating TPN due to multiple comorbid conditions, advanced age and risks associated.  All questions/concerns addressed. PMT will continue to follow and support patient as needed.  Objective: Primary Diagnoses: Present on Admission: **None**   Physical Exam Constitutional:      General: She is not in acute distress.    Comments: Jamal present  Pulmonary:     Effort: Pulmonary effort is normal. No respiratory distress.  Skin:    General: Skin is warm and dry.  Neurological:     Mental Status: She is alert and oriented to person, place, and time.     Motor: Weakness present.  Psychiatric:        Mood and Affect: Mood normal.        Behavior: Behavior normal.     Vital Signs: BP (!) 142/79   Pulse (!) 116   Temp 98.2 F (36.8 C)   Resp 17   Ht 5' 5 (1.651 m)   Wt 68 kg   SpO2 96%   BMI 24.96 kg/m  Pain Scale: 0-10   Pain Score: 0-No pain  IO: Intake/output summary: No intake or output data in the 24 hours ending 03/28/24 1400  LBM:   Baseline Weight: Weight: 68 kg Most recent weight: Weight: 68 kg      Assessment and Plan  SUMMARY OF RECOMMENDATIONS   Continue to address GOC  Palliative Prophylaxis:   Bowel Regimen, Delirium Protocol and Frequent Pain Assessment  Discussed With: Primary team and general surgery   Thank you for this consult and allowing Palliative Medicine to participate in the care of Arneta S. Arlys. Palliative medicine will continue to follow and assist as needed.   I personally spent a total of 75 minutes in the care of the patient today including preparing to see the patient,  getting/reviewing separately obtained history, performing a medically appropriate exam/evaluation, counseling and educating, referring and communicating with other health care professionals, documenting clinical information in the EHR, and coordinating care.    Signed by: Waddell Lesches, DNP, AGNP-C Palliative Medicine    Please contact Palliative Medicine Team phone at [phone] for questions and concerns.  For individual provider: See Amion   "

## 2024-03-28 NOTE — Progress Notes (Addendum)
 PT Cancellation Note  Patient Details Name: Raelyn Racette MRN: [id number] DOB: 11-16-1940   Cancelled Treatment:    Reason Eval/Treat Not Completed: Other (comment). PT orders received and pt chart reviewed. PT eval attempted at 0841 (pt eating breakfast, PT offering to come back) and 0941 (pt needing to be suctioned, RN notified). Will continue efforts as appropriate.  ADDENDUM 1250: Pt in bed upon entry with lunch tray. Politely declining participation with PT eval due to fatigue; HR elevated in 110's (HR max of 139bpm based on age). Agreeable for PT follow up tomorrow. Will continue efforts as appropriate.   Camie CHARLENA Kluver, PT, DPT 9:53 AM,03/28/24 Physical Therapist - [facility]

## 2024-03-29 LAB — BASIC METABOLIC PANEL WITH GFR
Anion gap: 17 — ABNORMAL HIGH (ref 5–15)
BUN: 25 mg/dL — ABNORMAL HIGH (ref 8–23)
CO2: 22 mmol/L (ref 22–32)
Calcium: 10 mg/dL (ref 8.9–10.3)
Chloride: 102 mmol/L (ref 98–111)
Creatinine, Ser: 1.12 mg/dL — ABNORMAL HIGH (ref 0.44–1.00)
GFR, Estimated: 49 mL/min — ABNORMAL LOW
Glucose, Bld: 220 mg/dL — ABNORMAL HIGH (ref 70–99)
Potassium: 3.8 mmol/L (ref 3.5–5.1)
Sodium: 141 mmol/L (ref 135–145)

## 2024-03-29 LAB — CBG MONITORING, ED
Glucose-Capillary: 204 mg/dL — ABNORMAL HIGH (ref 70–99)
Glucose-Capillary: 224 mg/dL — ABNORMAL HIGH (ref 70–99)
Glucose-Capillary: 269 mg/dL — ABNORMAL HIGH (ref 70–99)

## 2024-03-29 LAB — GLUCOSE, CAPILLARY
Glucose-Capillary: 182 mg/dL — ABNORMAL HIGH (ref 70–99)
Glucose-Capillary: 225 mg/dL — ABNORMAL HIGH (ref 70–99)
Glucose-Capillary: 195 mg/dL — ABNORMAL HIGH (ref 70–99)
Glucose-Capillary: 216 mg/dL — ABNORMAL HIGH (ref 70–99)

## 2024-03-29 LAB — MAGNESIUM: Magnesium: 2.1 mg/dL (ref 1.7–2.4)

## 2024-03-29 LAB — MRSA NEXT GEN BY PCR, NASAL: MRSA by PCR Next Gen: NOT DETECTED

## 2024-03-29 MED ORDER — INSULIN GLARGINE-YFGN 100 UNIT/ML ~~LOC~~ SOLN
5.0000 [IU] | Freq: Every day | SUBCUTANEOUS | Status: DC
  Administered 2024-03-29 – 2024-03-30 (×2): 5 [IU] via SUBCUTANEOUS
  Filled 2024-03-29 (×3): qty 0.05

## 2024-03-29 MED ORDER — METOPROLOL TARTRATE 5 MG/5ML IV SOLN: 5.0000 mg | INTRAVENOUS | Status: DC | PRN

## 2024-03-29 MED ORDER — METOPROLOL TARTRATE 25 MG PO TABS
12.5000 mg | ORAL_TABLET | Freq: Two times a day (BID) | ORAL | Status: DC
  Administered 2024-03-29 – 2024-03-30 (×2): 12.5 mg
  Filled 2024-03-29 (×2): qty 1

## 2024-03-29 MED ORDER — LACTATED RINGERS IV SOLN: INTRAVENOUS | Status: AC

## 2024-03-29 MED ORDER — CHLORHEXIDINE GLUCONATE CLOTH 2 % EX PADS
6.0000 | MEDICATED_PAD | Freq: Every day | CUTANEOUS | Status: DC
  Administered 2024-03-30: 6 via TOPICAL

## 2024-03-29 MED ORDER — BUPROPION HCL ER (SR) 150 MG PO TB12
150.0000 mg | ORAL_TABLET | Freq: Two times a day (BID) | ORAL | Status: DC
  Administered 2024-03-29 – 2024-03-30 (×2): 150 mg via ORAL
  Filled 2024-03-29 (×3): qty 1

## 2024-03-29 MED ORDER — FOLIC ACID 1 MG PO TABS: 1.0000 mg | ORAL_TABLET | Freq: Every day | ORAL | Status: DC

## 2024-03-29 MED ORDER — FOLIC ACID 1 MG PO TABS
1.0000 mg | ORAL_TABLET | Freq: Every day | ORAL | Status: DC
  Administered 2024-03-30: 1 mg
  Filled 2024-03-29: qty 1

## 2024-03-29 MED ORDER — PANTOPRAZOLE SODIUM 40 MG PO TBEC
40.0000 mg | DELAYED_RELEASE_TABLET | Freq: Every day | ORAL | Status: DC
  Administered 2024-03-30: 40 mg via ORAL
  Filled 2024-03-29: qty 1

## 2024-03-29 MED ORDER — ACETAMINOPHEN 650 MG RE SUPP: 650.0000 mg | Freq: Four times a day (QID) | RECTAL | Status: DC | PRN

## 2024-03-29 MED ORDER — ACETAMINOPHEN 325 MG PO TABS: 650.0000 mg | ORAL_TABLET | Freq: Four times a day (QID) | ORAL | Status: DC | PRN

## 2024-03-29 MED ORDER — INSULIN ASPART 100 UNIT/ML IJ SOLN
0.0000 [IU] | Freq: Three times a day (TID) | INTRAMUSCULAR | Status: DC
  Administered 2024-03-29 – 2024-03-30 (×3): 3 [IU] via SUBCUTANEOUS
  Filled 2024-03-29 (×3): qty 3

## 2024-03-29 MED ORDER — INSULIN ASPART 100 UNIT/ML IJ SOLN: 0.0000 [IU] | Freq: Every day | INTRAMUSCULAR | Status: DC

## 2024-03-29 MED ORDER — EZETIMIBE 10 MG PO TABS: 10.0000 mg | ORAL_TABLET | Freq: Every day | ORAL | Status: DC

## 2024-03-29 MED ORDER — EZETIMIBE 10 MG PO TABS
10.0000 mg | ORAL_TABLET | Freq: Every day | ORAL | Status: DC
  Administered 2024-03-29: 10 mg via ORAL
  Filled 2024-03-29: qty 1

## 2024-03-29 NOTE — Plan of Care (Signed)
 " Problem: Education: Goal: Ability to describe self-care measures that may prevent or decrease complications (Diabetes Survival Skills Education) will improve 03/29/2024 1941 by Dodson Millin, Sula, RN Outcome: Progressing 03/29/2024 1915 by Dodson Millin, Sula, RN Outcome: Progressing Goal: Individualized Educational Video(s) 03/29/2024 1941 by Dodson Millin, Sula, RN Outcome: Progressing 03/29/2024 1915 by Dodson Millin, Sula, RN Outcome: Progressing   Problem: Coping: Goal: Ability to adjust to condition or change in health will improve 03/29/2024 1941 by Dodson Millin, Sula, RN Outcome: Progressing 03/29/2024 1915 by Dodson Millin, Sula, RN Outcome: Progressing   Problem: Fluid Volume: Goal: Ability to maintain a balanced intake and output will improve 03/29/2024 1941 by Dodson Millin Sula, RN Outcome: Progressing 03/29/2024 1915 by Dodson Millin, Sula, RN Outcome: Progressing   Problem: Health Behavior/Discharge Planning: Goal: Ability to identify and utilize available resources and services will improve 03/29/2024 1941 by Dodson Millin, Sula, RN Outcome: Progressing 03/29/2024 1915 by Dodson Millin, Sula, RN Outcome: Progressing Goal: Ability to manage health-related needs will improve 03/29/2024 1941 by Dodson Millin Sula, RN Outcome: Progressing 03/29/2024 1915 by Dodson Millin, Sula, RN Outcome: Progressing   Problem: Metabolic: Goal: Ability to maintain appropriate glucose levels will improve 03/29/2024 1941 by Dodson Millin Sula, RN Outcome: Progressing 03/29/2024 1915 by Dodson Millin, Sula, RN Outcome: Progressing   Problem: Nutritional: Goal: Maintenance of adequate nutrition will improve 03/29/2024 1941 by Dodson Millin Sula, RN Outcome: Progressing 03/29/2024 1915 by Dodson Millin, Sula, RN Outcome: Progressing Goal: Progress toward achieving an optimal weight will improve 03/29/2024 1941 by Dodson Millin Sula, RN Outcome: Progressing 03/29/2024 1915 by Dodson Millin, Sula, RN Outcome: Progressing   Problem: Skin Integrity: Goal: Risk for impaired skin integrity will decrease 03/29/2024 1941 by Dodson Millin Sula, RN Outcome: Progressing 03/29/2024 1915 by Dodson Millin, Sula, RN Outcome: Progressing   Problem: Tissue Perfusion: Goal: Adequacy of tissue perfusion will improve 03/29/2024 1941 by Dodson Millin Sula, RN Outcome: Progressing 03/29/2024 1915 by Dodson Millin, Sula, RN Outcome: Progressing   Problem: Education: Goal: Knowledge of General Education information will improve Description: Including pain rating scale, medication(s)/side effects and non-pharmacologic comfort measures 03/29/2024 1941 by Dodson Millin Sula, RN Outcome: Progressing 03/29/2024 1915 by Dodson Millin, Sula, RN Outcome: Progressing   Problem: Health Behavior/Discharge Planning: Goal: Ability to manage health-related needs will improve 03/29/2024 1941 by Dodson Millin Sula, RN Outcome: Progressing 03/29/2024 1915 by Dodson Millin, Sula, RN Outcome: Progressing   Problem: Clinical Measurements: Goal: Ability to maintain clinical measurements within normal limits will improve 03/29/2024 1941 by Dodson Millin Sula, RN Outcome: Progressing 03/29/2024 1915 by Dodson Millin, Sula, RN Outcome: Progressing Goal: Will remain free from infection 03/29/2024 1941 by Dodson Millin Sula, RN Outcome: Progressing 03/29/2024 1915 by Dodson Millin, Sula, RN Outcome: Progressing Goal: Diagnostic test results will improve 03/29/2024 1941 by Dodson Millin Sula, RN Outcome: Progressing 03/29/2024 1915 by Dodson Millin, Sula, RN Outcome: Progressing Goal: Respiratory complications will improve 03/29/2024 1941 by Dodson Millin Sula, RN Outcome: Progressing 03/29/2024 1915 by Dodson Millin, Sula, RN Outcome: Progressing Goal: Cardiovascular complication will be  avoided 03/29/2024 1941 by Dodson Millin Sula, RN Outcome: Progressing 03/29/2024 1915 by Dodson Millin Sula, RN Outcome: Progressing   Problem: Activity: Goal: Risk for activity intolerance will decrease 03/29/2024 1941 by Dodson Millin, Sula, RN Outcome: Progressing 03/29/2024 1915 by Dodson Millin, Sula, RN Outcome: Progressing   Problem: Nutrition: Goal: Adequate nutrition will be maintained 03/29/2024 1941 by Dodson Millin Sula, RN Outcome: Progressing 03/29/2024 1915 by Dodson Millin, Sula, RN Outcome: Progressing  Problem: Coping: Goal: Level of anxiety will decrease 03/29/2024 1941 by Dodson Millin, Sula, RN Outcome: Progressing 03/29/2024 1915 by Dodson Millin, Sula, RN Outcome: Progressing   Problem: Elimination: Goal: Will not experience complications related to bowel motility 03/29/2024 1941 by Dodson Millin Sula, RN Outcome: Progressing 03/29/2024 1915 by Dodson Millin, Sula, RN Outcome: Progressing Goal: Will not experience complications related to urinary retention 03/29/2024 1941 by Dodson Millin Sula, RN Outcome: Progressing 03/29/2024 1915 by Dodson Millin, Sula, RN Outcome: Progressing   Problem: Pain Managment: Goal: General experience of comfort will improve and/or be controlled 03/29/2024 1941 by Dodson Millin Sula, RN Outcome: Progressing 03/29/2024 1915 by Dodson Millin, Sula, RN Outcome: Progressing   Problem: Safety: Goal: Ability to remain free from injury will improve 03/29/2024 1941 by Dodson Millin Sula, RN Outcome: Progressing 03/29/2024 1915 by Dodson Millin Sula, RN Outcome: Progressing   "

## 2024-03-29 NOTE — Consult Note (Signed)
 WOC team consulted to educate patient on applying ostomy pouch over former G tube site (see WOC consult note 1/30).  Secure chat to primary team no WOC nurse on campus until Monday or Tuesday depending on safety for travel.   WOC team will follow as above.   Thank you,    Powell Bar MSN, RN-BC, [employer]

## 2024-03-29 NOTE — Progress Notes (Signed)
 "                                                                                                                                                                                                          Daily Progress Note   Patient Name: Angela David       Date: 03/29/2024 DOB: [birth year]  Age: 84 y.o. MRN#: [id number] Attending Physician: Jerelene Critchley, MD Primary Care Physician: [name], MD Admit Date: 03/26/2024  Reason for Consultation/Follow-up: Establishing goals of care  HPI/Brief Hospital Review:  84 y.o. female  with past medical history of pulmonary fibrosis, pulmonary HTN, s/p trach placement (2012) and PEG placement, chronic respiratory failure--requires mechanical ventilation at night via trach, no oxygen requirements during day, IDDM, CHF, CKD stage 3a and history of breast cancer s/p lumpectomy and chemo/radiation admitted from home on 03/26/2024 with recurrent G-tube dislodgement. Seen in ED earlier this month for same issue, tube replaced at that time and sent home. Back to ED due to recurrent dislodgement--general surgery and IR consulted.   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 Jordis spoke with Mr. Wheeler regarding surgical interventions that would require an extensive abdominal surgery that would have high risk of morbidity--during discussions, Angela David clear in her refusal of extensive surgical operation--concentration of quality of life    Palliative medicine was consulted for assisting with goals of care conversations.  Following up today to continue goals of care conversations and coordination of care.  Subjective: Chart reviewed: Labs:Reviewed from today, slight bump in creatinine/BUN Vital signs:Reviewed from today Progress Notes:Primary team, general surgery and nursing notes reviewed since admission  Visited with Angela David at her bedside. She  is awake, alert and able to engage in conversation. She shares she feels well today. Nursing staff at bedside assisting with replacement of ostomy bag due to leakage, noted significant output continues from fistula.  Angela David shares she would like to be discharged home. Inquired about her plans on nutritional support. Angela David shares her plans to continue eating and drinking as before, placing emphasis on protein intake. Attempted to express my concern about her ability to meet her daily nutritional and hydration demands--Angela David shares she is going to do the best she can do and requests I change the subject. I educated her on the role of outpatient palliative care for which she was accepting of a consult being placed for referral. Order placed to TOC.  Discussed with Angela David the importance of her  being provided education on how to properly change and apply an ostomy bag to her G-tube site--this education provided by WOC nurse-unsure if available on weekends. Also shared she will likely benefit from [employer] services if she is eligible. She remains hopeful all can be arranged for her to discharge home today.  Secure messages sent to primary team and TOC regarding above information. Later from chart review, WOC nurse not available today and may not be available until Tuesday due to winter weather.  Will have PMT provider follow-up with Angela David again tomorrow to attempt to continue GOC conversations. Concern related to Angela David fully understanding the severity and complexity of fistula and inability to meet nutritional/hydration needs.  Objective:  Physical Exam Constitutional:      General: She is not in acute distress.    Appearance: She is ill-appearing.     Comments: Trach in place  Pulmonary:     Effort: Pulmonary effort is normal. No respiratory distress.  Abdominal:     General: There is no distension.     Palpations: Abdomen is soft.     Comments: G-tube site with  herniation/fistula, covered by ostomy bag, noted redness around site  Skin:    General: Skin is warm and dry.  Neurological:     Mental Status: She is alert and oriented to person, place, and time.  Psychiatric:        Mood and Affect: Mood normal.        Behavior: Behavior normal.             Vital Signs: BP 135/61   Pulse (!) 116   Temp 98 F (36.7 C) (Oral)   Resp 20   Ht 5' 5 (1.651 m)   Wt 68 kg   SpO2 99%   BMI 24.96 kg/m  SpO2: SpO2: 99 % O2 Device: O2 Device: Tracheostomy Collar O2 Flow Rate: O2 Flow Rate (L/min): 5 L/min   Palliative Care Assessment & Plan   Assessment/Recommendation/Plan  Continued GOC conversations needed as able Ordered placed for referral for outpatient palliative services  Care plan was discussed with primary team, nursing staff and TOC.  Thank you for allowing the Palliative Medicine Team to assist in the care of this patient.  I personally spent a total of 50 minutes in the care of the patient today including preparing to see the patient, getting/reviewing separately obtained history, performing a medically appropriate exam/evaluation, counseling and educating, placing orders, referring and communicating with other health care professionals, and communicating results.   Waddell Lesches, DNP, AGNP-C Palliative Medicine   Please contact Palliative Medicine Team phone at [phone] for questions and concerns.   "

## 2024-03-29 NOTE — Progress Notes (Addendum)
 " PROGRESS NOTE  Angela David  DOB: 1940-07-23  PCP: [name], MD FMW:[id number]  DOA: 03/26/2024  LOS: 0 days  Hospital Day: 4  Subjective: Patient was seen and examined this morning. Pleasant elderly Caucasian female.  Propped up in bed Patient not in distress.  She has a tracheostomy tube Seen by palliative care, would like to go home with comfort feeding and outpatient palliative follow-up Started IV fluids due to dehydration, tachycardia and AKI Patient was found to be in respiratory distress and tachycardic, RT suctioned and found to have mucous plug, improved s/p suction  Brief narrative: Angela David is a 84 y.o. female with PMH significant for pulmonary fibrosis/pulmonary hypertension with chronic respiratory failure s/p tracheostomy on nocturnal ventilation, dysphagia (s/p of PEG), breast cancer s/p lumpectomy/chemoradiation, IDDM,  dCHF, CKD stage IIIa. Lives at home.  1/29, patient presented to the ED with complaint of recurrent G-tube dislodgment. Patient has a large ostomy and has had upsizing of her G-tube in the past.  But she continues to have ongoing problems with leakage and recurrent dislodgment. She was seen in the ED on 1/14 after her 62 French tube fell out and was replaced in the ED.  The tube fell out again on 1/29 and hence brought to the ED.    EDP discussed the case with general surgery Dr. Desiderio who advised that it might be possible to close the current opening and change it to a J-tube.  This was discussed with IR Dr. Karalee.  Colostomy bag was placed over the G-tube tract to collect the secretions. Recommended admission to [facility]  Assessment and plan: Recurrent G-tube dislodgment  G-tube status for chronic dysphagia Complex history of multiple surgeries, colectomy for cancer, ventral hernia, G-tube placed.  Has gastrocutaneous fistula Only option would be major extensive surgery doing laparotomy with takedown of gastrocutaneous  fistula abdominal wall reconstruction with hernia repair and placement of feeding jejunostomy, which she may not tolerate well Continue with colostomy bag over G-tube site as recommended by IR Continue Protonix  Refused TPN, does not want to be on hospice Palliative care following, decided to get discharged with comfort feeding and outpatient palliative follow-up, does not want hospice WOC reconsulted for education/training for ostomy bag -to be seen tomorrow HH RN at discharge  Mild AKI AGMA -likely starvation ketoacidosis Patient dehydrated on exam, tachycardic and increase in creatinine Likely due to loss of fluids in colostomy bag On gentle IV fluids (cautious with Hx heart failure), monitor creatinine    Pulmonary fibrosis  / pulmonary hypertension  Chronic respiratory failure with hypoxia Tracheostomy status No acute issues Continue as needed nebulization Respiratory following for trach care and nighttime ventilator    Chronic diastolic CHF Pulmonary hypertension Dehydrated PTA meds- metoprolol  tartrate 25 mg daily IV Metoprolol  prn  Type 2 diabetes mellitus Diabetic neuropathy A1c 7.6 on 09/2023 PTA meds-Toujeo  20 units daily Lantus  5u, SSI Continue gabapentin  for neuropathy  HLD Zetia   Depression with anxiety PTA meds- bupropion  150 mg twice daily, Lexapro  20 mg daily, Requip  0.5 mg daily, melatonin  H/o breast cancer s/p lumpectomy/chemoradiation No acute issues suspected   Nutrition Status:        Mobility: Encourage ambulation.  PT eval requested    Goals of care   Code Status: Full Code     DVT prophylaxis:  Place and maintain sequential compression device Start: 03/27/24 1042   Antimicrobials: None Fluid: None Consultants: Surgery, IR Family Communication: None, at the bedside  Status: Observation  Level of care:  Stepdown   Patient is from: Home Needs to continue in-hospital care: Pending GOC Anticipated d/c to: Hopefully home  ultimately      Diet:  Diet Order             DIET - DYS 1 Room service appropriate? Yes; Fluid consistency: Thin  Diet effective now                   Scheduled Meds:  feeding supplement (KATE FARMS STANDARD 1.4) Liquid  325 mL Oral TID BM   insulin  aspart  0-9 Units Subcutaneous Q4H   sodium chloride  HYPERTONIC  4 mL Nebulization BID    PRN meds: acetaminophen  **OR** acetaminophen , artificial tears, ipratropium-albuterol , ondansetron  **OR** ondansetron  (ZOFRAN ) IV, zinc  oxide   Infusions:   lactated ringers       Antimicrobials: Anti-infectives (From admission, onward)    None       Objective: Vitals:   03/29/24 0432 03/29/24 0810  BP: (!) 14/6 131/65  Pulse: (!) 111 (!) 109  Resp: 17 18  Temp: 98.3 F (36.8 C) 98.4 F (36.9 C)  SpO2: 100% 100%   No intake or output data in the 24 hours ending 03/29/24 1116 Filed Weights   03/26/24 1551  Weight: 68 kg   Weight change:  Body mass index is 24.96 kg/m.   Physical Exam: General exam: Pleasant, elderly Caucasian female.  Not in distress Skin: No rashes, lesions or ulcers. HEENT: Atraumatic, normocephalic, no obvious bleeding.  Has tracheostomy tube anteriorly Lungs: Clear to auscultation bilaterally,  CVS: S1, S2, no murmur,   GI/Abd: Soft, nontender, nondistended, bowel sound present, PEG tube opening anteriorly with a bag collecting the secretion CNS: Alert, awake, oriented x 3.  Communicates with moving her lips or blocking the trach tube Psychiatry: Mood appropriate Extremities: No pedal edema, no calf tenderness,   Data Review: I have personally reviewed the laboratory data and studies available.  F/u labs ordered Unresulted Labs (From admission, onward)    None      Total time spent: 53 mins  Signed, Laree Lock, MD [facility] 03/29/2024  "

## 2024-03-29 NOTE — Plan of Care (Signed)

## 2024-03-30 LAB — BASIC METABOLIC PANEL WITH GFR
Anion gap: 12 (ref 5–15)
BUN: 24 mg/dL — ABNORMAL HIGH (ref 8–23)
CO2: 25 mmol/L (ref 22–32)
Calcium: 9.4 mg/dL (ref 8.9–10.3)
Chloride: 102 mmol/L (ref 98–111)
Creatinine, Ser: 0.97 mg/dL (ref 0.44–1.00)
GFR, Estimated: 59 mL/min — ABNORMAL LOW
Glucose, Bld: 209 mg/dL — ABNORMAL HIGH (ref 70–99)
Potassium: 3.8 mmol/L (ref 3.5–5.1)
Sodium: 139 mmol/L (ref 135–145)

## 2024-03-30 LAB — CBC
HCT: 40.4 % (ref 36.0–46.0)
Hemoglobin: 13.1 g/dL (ref 12.0–15.0)
MCH: 27.5 pg (ref 26.0–34.0)
MCHC: 32.4 g/dL (ref 30.0–36.0)
MCV: 84.7 fL (ref 80.0–100.0)
Platelets: 231 10*3/uL (ref 150–400)
RBC: 4.77 MIL/uL (ref 3.87–5.11)
RDW: 12.6 % (ref 11.5–15.5)
WBC: 10.5 10*3/uL (ref 4.0–10.5)
nRBC: 0 % (ref 0.0–0.2)

## 2024-03-30 LAB — GLUCOSE, CAPILLARY
Glucose-Capillary: 197 mg/dL — ABNORMAL HIGH (ref 70–99)
Glucose-Capillary: 224 mg/dL — ABNORMAL HIGH (ref 70–99)
Glucose-Capillary: 209 mg/dL — ABNORMAL HIGH (ref 70–99)

## 2024-03-30 MED ORDER — DIPHENHYDRAMINE HCL 25 MG PO CAPS: 25.0000 mg | ORAL_CAPSULE | Freq: Three times a day (TID) | ORAL | Status: AC

## 2024-03-30 MED ORDER — KATE FARMS STANDARD 1.4 PO LIQD: 325.0000 mL | Freq: Three times a day (TID) | ORAL | Status: AC

## 2024-03-30 MED ORDER — METOPROLOL TARTRATE 25 MG PO TABS: 25.0000 mg | ORAL_TABLET | Freq: Every day | ORAL | Status: AC

## 2024-03-30 NOTE — Plan of Care (Signed)
 The patient has been discharged. IV has been removed. Transported via wheelchair to vehicle. Ostomy supplies has been provided and education has been completed.  Problem: Activity: Goal: Risk for activity intolerance will decrease Outcome: Completed/Met   Problem: Education: Goal: Ability to describe self-care measures that may prevent or decrease complications (Diabetes Survival Skills Education) will improve Outcome: Completed/Met Goal: Individualized Educational Video(s) Outcome: Completed/Met   Problem: Coping: Goal: Ability to adjust to condition or change in health will improve Outcome: Completed/Met   Problem: Fluid Volume: Goal: Ability to maintain a balanced intake and output will improve Outcome: Completed/Met   Problem: Health Behavior/Discharge Planning: Goal: Ability to identify and utilize available resources and services will improve Outcome: Completed/Met Goal: Ability to manage health-related needs will improve Outcome: Completed/Met   Problem: Metabolic: Goal: Ability to maintain appropriate glucose levels will improve Outcome: Completed/Met   Problem: Nutritional: Goal: Maintenance of adequate nutrition will improve Outcome: Completed/Met Goal: Progress toward achieving an optimal weight will improve Outcome: Completed/Met   Problem: Skin Integrity: Goal: Risk for impaired skin integrity will decrease Outcome: Completed/Met   Problem: Tissue Perfusion: Goal: Adequacy of tissue perfusion will improve Outcome: Completed/Met   Problem: Education: Goal: Knowledge of General Education information will improve Description: Including pain rating scale, medication(s)/side effects and non-pharmacologic comfort measures Outcome: Completed/Met   Problem: Health Behavior/Discharge Planning: Goal: Ability to manage health-related needs will improve Outcome: Completed/Met   Problem: Clinical Measurements: Goal: Ability to maintain clinical measurements within  normal limits will improve Outcome: Completed/Met Goal: Will remain free from infection Outcome: Completed/Met Goal: Diagnostic test results will improve Outcome: Completed/Met Goal: Respiratory complications will improve Outcome: Completed/Met Goal: Cardiovascular complication will be avoided Outcome: Completed/Met   Problem: Activity: Goal: Risk for activity intolerance will decrease Outcome: Completed/Met   Problem: Nutrition: Goal: Adequate nutrition will be maintained Outcome: Completed/Met   Problem: Coping: Goal: Level of anxiety will decrease Outcome: Completed/Met   Problem: Elimination: Goal: Will not experience complications related to bowel motility Outcome: Completed/Met Goal: Will not experience complications related to urinary retention Outcome: Completed/Met   Problem: Pain Managment: Goal: General experience of comfort will improve and/or be controlled Outcome: Completed/Met   Problem: Safety: Goal: Ability to remain free from injury will improve Outcome: Completed/Met   Problem: Skin Integrity: Goal: Risk for impaired skin integrity will decrease Outcome: Completed/Met   Problem: Acute Rehab PT Goals(only PT should resolve) Goal: Pt Will Go Supine/Side To Sit Outcome: Completed/Met Goal: Patient Will Transfer Sit To/From Stand Outcome: Completed/Met Goal: Pt Will Transfer Bed To Chair/Chair To Bed Outcome: Completed/Met Goal: PT Additional Goal #1 Outcome: Completed/Met

## 2024-03-30 NOTE — Plan of Care (Signed)
" °  Problem: Education: Goal: Ability to describe self-care measures that may prevent or decrease complications (Diabetes Survival Skills Education) will improve Outcome: Completed/Met Goal: Individualized Educational Video(s) Outcome: Completed/Met   Problem: Coping: Goal: Ability to adjust to condition or change in health will improve Outcome: Completed/Met   Problem: Fluid Volume: Goal: Ability to maintain a balanced intake and output will improve Outcome: Completed/Met   Problem: Health Behavior/Discharge Planning: Goal: Ability to identify and utilize available resources and services will improve Outcome: Completed/Met Goal: Ability to manage health-related needs will improve Outcome: Completed/Met   Problem: Metabolic: Goal: Ability to maintain appropriate glucose levels will improve Outcome: Completed/Met   Problem: Nutritional: Goal: Maintenance of adequate nutrition will improve Outcome: Completed/Met Goal: Progress toward achieving an optimal weight will improve Outcome: Completed/Met   Problem: Skin Integrity: Goal: Risk for impaired skin integrity will decrease Outcome: Completed/Met   Problem: Tissue Perfusion: Goal: Adequacy of tissue perfusion will improve Outcome: Completed/Met   Problem: Education: Goal: Knowledge of General Education information will improve Description: Including pain rating scale, medication(s)/side effects and non-pharmacologic comfort measures Outcome: Completed/Met   Problem: Health Behavior/Discharge Planning: Goal: Ability to manage health-related needs will improve Outcome: Completed/Met   Problem: Clinical Measurements: Goal: Ability to maintain clinical measurements within normal limits will improve Outcome: Completed/Met Goal: Will remain free from infection Outcome: Completed/Met Goal: Diagnostic test results will improve Outcome: Completed/Met Goal: Respiratory complications will improve Outcome: Completed/Met Goal:  Cardiovascular complication will be avoided Outcome: Completed/Met   Problem: Activity: Goal: Risk for activity intolerance will decrease Outcome: Completed/Met   Problem: Nutrition: Goal: Adequate nutrition will be maintained Outcome: Completed/Met   Problem: Coping: Goal: Level of anxiety will decrease Outcome: Completed/Met   Problem: Elimination: Goal: Will not experience complications related to bowel motility Outcome: Completed/Met Goal: Will not experience complications related to urinary retention Outcome: Completed/Met   Problem: Pain Managment: Goal: General experience of comfort will improve and/or be controlled Outcome: Completed/Met   Problem: Safety: Goal: Ability to remain free from injury will improve Outcome: Completed/Met   Problem: Skin Integrity: Goal: Risk for impaired skin integrity will decrease Outcome: Completed/Met   Problem: Acute Rehab PT Goals(only PT should resolve) Goal: Pt Will Go Supine/Side To Sit Outcome: Completed/Met Goal: Patient Will Transfer Sit To/From Stand Outcome: Completed/Met Goal: Pt Will Transfer Bed To Chair/Chair To Bed Outcome: Completed/Met Goal: PT Additional Goal #1 Outcome: Completed/Met   "

## 2024-03-30 NOTE — Plan of Care (Signed)
